# Patient Record
Sex: Male | Born: 1935 | Race: White | Hispanic: No | Marital: Married | Smoking: Former smoker
Health system: Southern US, Community
[De-identification: ages and names within clinical notes are randomized; demographics above are authoritative.]

## PROBLEM LIST (undated history)

## (undated) DIAGNOSIS — I7121 Aneurysm of the ascending aorta, without rupture: Secondary | ICD-10-CM

## (undated) DIAGNOSIS — N183 Chronic kidney disease, stage 3 unspecified: Secondary | ICD-10-CM

## (undated) DIAGNOSIS — I5189 Other ill-defined heart diseases: Secondary | ICD-10-CM

## (undated) DIAGNOSIS — E785 Hyperlipidemia, unspecified: Secondary | ICD-10-CM

## (undated) DIAGNOSIS — I480 Paroxysmal atrial fibrillation: Secondary | ICD-10-CM

## (undated) DIAGNOSIS — I10 Essential (primary) hypertension: Secondary | ICD-10-CM

## (undated) DIAGNOSIS — K219 Gastro-esophageal reflux disease without esophagitis: Secondary | ICD-10-CM

## (undated) DIAGNOSIS — R972 Elevated prostate specific antigen [PSA]: Secondary | ICD-10-CM

## (undated) DIAGNOSIS — Z87442 Personal history of urinary calculi: Secondary | ICD-10-CM

## (undated) DIAGNOSIS — C9000 Multiple myeloma not having achieved remission: Secondary | ICD-10-CM

## (undated) DIAGNOSIS — Z86010 Personal history of colonic polyps: Secondary | ICD-10-CM

## (undated) DIAGNOSIS — N400 Enlarged prostate without lower urinary tract symptoms: Secondary | ICD-10-CM

## (undated) DIAGNOSIS — R52 Pain, unspecified: Secondary | ICD-10-CM

## (undated) DIAGNOSIS — N289 Disorder of kidney and ureter, unspecified: Secondary | ICD-10-CM

## (undated) DIAGNOSIS — S22000A Wedge compression fracture of unspecified thoracic vertebra, initial encounter for closed fracture: Secondary | ICD-10-CM

## (undated) HISTORY — DX: Pain, unspecified: R52

## (undated) HISTORY — PX: TONSILLECTOMY: SUR1361

## (undated) HISTORY — DX: Aneurysm of the ascending aorta, without rupture: I71.21

## (undated) HISTORY — DX: Multiple myeloma not having achieved remission: C90.00

## (undated) HISTORY — DX: Other ill-defined heart diseases: I51.89

## (undated) HISTORY — DX: Benign prostatic hyperplasia without lower urinary tract symptoms: N40.0

## (undated) HISTORY — DX: Chronic kidney disease, stage 3 unspecified: N18.30

## (undated) HISTORY — DX: Hyperlipidemia, unspecified: E78.5

## (undated) HISTORY — DX: Elevated prostate specific antigen (PSA): R97.20

## (undated) HISTORY — DX: Paroxysmal atrial fibrillation: I48.0

## (undated) HISTORY — DX: Essential (primary) hypertension: I10

## (undated) HISTORY — DX: Wedge compression fracture of unspecified thoracic vertebra, initial encounter for closed fracture: S22.000A

## (undated) HISTORY — PX: KIDNEY STONE SURGERY: SHX686

## (undated) HISTORY — DX: Personal history of colonic polyps: Z86.010

## (undated) HISTORY — PX: COLON SURGERY: SHX602

---
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
## 2006-09-13 ENCOUNTER — Encounter: Payer: Self-pay | Admitting: Family Medicine

## 2006-09-13 DIAGNOSIS — Z860100 Personal history of colon polyps, unspecified: Secondary | ICD-10-CM

## 2006-09-13 HISTORY — DX: Personal history of colonic polyps: Z86.010

## 2006-09-13 HISTORY — DX: Personal history of colon polyps, unspecified: Z86.0100

## 2006-10-18 ENCOUNTER — Ambulatory Visit: Payer: Self-pay | Admitting: Family Medicine

## 2006-10-25 ENCOUNTER — Ambulatory Visit: Payer: Self-pay | Admitting: Family Medicine

## 2006-10-25 LAB — CONVERTED CEMR LAB
ALT: 15 units/L (ref 0–40)
Alkaline Phosphatase: 78 units/L (ref 39–117)
BUN: 16 mg/dL (ref 6–23)
CO2: 31 meq/L (ref 19–32)
Calcium: 9.1 mg/dL (ref 8.4–10.5)
GFR calc Af Amer: 77 mL/min
GFR calc non Af Amer: 63 mL/min
Total CHOL/HDL Ratio: 5.1
Triglycerides: 205 mg/dL (ref 0–149)

## 2006-12-21 ENCOUNTER — Encounter: Payer: Self-pay | Admitting: Family Medicine

## 2006-12-21 DIAGNOSIS — I10 Essential (primary) hypertension: Secondary | ICD-10-CM | POA: Insufficient documentation

## 2006-12-21 DIAGNOSIS — K219 Gastro-esophageal reflux disease without esophagitis: Secondary | ICD-10-CM | POA: Insufficient documentation

## 2006-12-21 DIAGNOSIS — E781 Pure hyperglyceridemia: Secondary | ICD-10-CM | POA: Insufficient documentation

## 2006-12-21 DIAGNOSIS — N200 Calculus of kidney: Secondary | ICD-10-CM | POA: Insufficient documentation

## 2007-01-16 ENCOUNTER — Ambulatory Visit: Payer: Self-pay | Admitting: Family Medicine

## 2007-07-21 ENCOUNTER — Ambulatory Visit: Payer: Self-pay

## 2007-09-14 HISTORY — PX: RESECTION SOFT TISSUE TUMOR LEG / ANKLE RADICAL: SUR1250

## 2007-10-06 ENCOUNTER — Ambulatory Visit: Payer: Self-pay | Admitting: Family Medicine

## 2007-11-12 LAB — CONVERTED CEMR LAB
Cholesterol: 210 mg/dL
Glucose, Bld: 98 mg/dL
HDL: 41 mg/dL
LDL Cholesterol: 123 mg/dL
Triglycerides: 230 mg/dL

## 2008-10-16 ENCOUNTER — Ambulatory Visit: Payer: Self-pay | Admitting: Family Medicine

## 2008-12-30 ENCOUNTER — Encounter: Payer: Self-pay | Admitting: Family Medicine

## 2009-01-23 ENCOUNTER — Ambulatory Visit: Payer: Self-pay | Admitting: Unknown Physician Specialty

## 2009-01-23 ENCOUNTER — Encounter: Payer: Self-pay | Admitting: Family Medicine

## 2009-02-17 ENCOUNTER — Ambulatory Visit: Payer: Self-pay | Admitting: Family Medicine

## 2009-02-26 ENCOUNTER — Encounter: Payer: Self-pay | Admitting: Family Medicine

## 2009-02-28 LAB — CONVERTED CEMR LAB
AST: 27 units/L (ref 0–37)
Albumin: 4 g/dL (ref 3.5–5.2)
Alkaline Phosphatase: 59 units/L (ref 39–117)
CO2: 30 meq/L (ref 19–32)
Glucose, Bld: 100 mg/dL — ABNORMAL HIGH (ref 70–99)
Potassium: 4.1 meq/L (ref 3.5–5.1)
Sodium: 142 meq/L (ref 135–145)
Total Protein: 6.4 g/dL (ref 6.0–8.3)

## 2009-07-11 ENCOUNTER — Encounter: Payer: Self-pay | Admitting: Family Medicine

## 2011-01-29 NOTE — Assessment & Plan Note (Signed)
[facility]                           [place] OFFICE NOTE   DUNTE, BLINN                      MRN:          [id number]  DATE:10/18/2006                            DOB:          Jan 19, 1933    CHIEF COMPLAINT:  A 77 year old white male here to establish new doctor.   HISTORY OF PRESENT ILLNESS:  Mr. Hartner states he is a very healthy male  who has not seen a doctor regularly.  He states that he previously had a  physical about 2 years ago at [facility], but was unhappy with  their care.  He comes to clinic today with the following issues:  1. Hypertension, poor control:  He states that he has been told off      and on that he has high blood pressure when he has it measured at      the [facility] and he has also noticed that it is above 140/90      intermittently at pharmacies.  He has some measurements that he      brought along with him showing blood pressures around 167/83,      160/90, but also 134/85.  He has never been on blood pressure      medication in the past but agrees that he likely has high blood      pressure and is open to medication.  He does try and avoid high      salt diet.  2. Cough, acute:  He has been having 3 days of dry that is keeping him      up at night.  He feels like he has had fever off and on.  He has      congestion, post nasal drip, no face pain, no ear pain, no wheeze,      no shortness of breath.  He has been using cough drops, Mucinex,      but is not sure if he is using a decongestant.  He does have a      history of smoking with a 10 pack year history remotely.   REVIEW OF SYSTEMS:  Occasional headache recently with cold, no  dizziness, no syncope, wears glasses, no hearing problems, no chest  pain, no shortness of breath, no nausea, vomiting, diarrhea,  constipation or rectal bleeding.   PAST MEDICAL HISTORY:  1. Hypertension.  2. GERD.  3. Kidney stones.   HOSPITALIZATION, SURGERIES AND  PROCEDURES:  1. 1942 neck infection possibly impetigo.  2. 1946 punctured stomach after was impaled on fence.  3. 1962 sinus surgery.  4. 1940 tonsillectomy.   ALLERGIES:  None.   MEDICATIONS:  1. Vitamin C 2 tablets daily.  2. Beta-carotene 2 tablets daily.  3. Saw palmetto 1 tablet daily.  4. Zinc 1 tablet daily.  5. Magnesium 2 tablets daily.  6. CoQ 10, 1 tablet daily.  7. Vitamin E 1 tablet daily.  8. Vitamin B complex 1 tablet daily.   SOCIAL HISTORY:  Former smoker with 10 pack year history remotely,  drinks 1-2 alcoholic beverages per week.  No history  of drug use.  He is  a retired Forensic psychologist.  He has been married for 5 years.  He has 4  children of his own, 2 who are biological who are healthy, 2 adopted.  He exercises 5 times per week at the gym doing cardio and weight  lifting.  He eats 3 meals a day, fruits and vegetables somewhat and no  caffeine and rare sodas.   FAMILY HISTORY:  Father deceased at age 27 with MI.  Mother alive at age  73, healthy.  No MI before age 25.  He has 1 brother and 2 sisters who  are healthy, except 1 sister did have throat cancer.  Paternal  grandfather and paternal uncles did have diabetes.  There is no family  history of any type of cancer as far as he is aware.   PHYSICAL EXAMINATION:  Height 67-3/4 inches, weight 199 making BMI 30,  blood pressure 154/100, pulse 80, temperature 98.8.  GENERAL:  A overweight appearing male in no apparent distress, appears  younger than stated age.  HEENT:  PERRLA, extraocular muscles intact.  Oropharynx with erythema.  Tympanic membranes with clear fluid, nares patent. No cervical or  supraclavicular lymphadenopathy, no thyromegaly, mild tenderness  palpation of her bilateral sinuses and axillary sinuses.  CARDIOVASCULAR:  Regular rate and rhythm.  No murmurs, rubs or gallops.  PULMONARY:  Clear to auscultation bilaterally.  No wheezes, rales or  rhonchi.  ABDOMEN:  Soft, nontender, no  abnormal bowel sounds, no  hepatosplenomegaly.  MUSCULOSKELETAL:  Strength 5/5 in upper and lower extremities.  NEURO:  Alert and orientated x 3.  Cranial nerves 2 through 12 grossly  intact.  Patella reflexes 2+.  SKIN:  No lesions.   ASSESSMENT/PLAN:  1. Hypertension, poor control:  We did discuss a low salt diet and      continuing exercise.  We will begin him on HCTZ 25 mg daily.  He      will follow up in 3 months, but we will also follow his blood      pressures at home and he will let me know if it is not coming down      less than 140/90.  2. Acute bronchitis:  Given his age and his reported severity of cough      as well as his history of smoking, I will begin him on a Z-Pak as      directed for the next 5 days.  He will also use codeine with      guaifenesin for a cough suppressant at night.  He will let me know      if he does not improve and will go to the emergency room if he has      shortness of breath.  3. Prevention:  He is due for a cholesterol and diabetes screen.  He      is up to date with prostate screen.  We did briefly discuss      colonoscopy verus Hemoccult and he will consider these for      colon cancer screening.  He is due for tetanus, but would like me      to review his old records      before I administer this.  He is also unsure of whether he has had      Pneumovax in the past.  I will get records from his previous      physician.     Eliezer Lofts, MD  Electronically Signed    AB/MedQ  DD: 10/19/2006  DT: 10/19/2006  Job #: [id number]

## 2011-06-10 ENCOUNTER — Other Ambulatory Visit: Payer: Self-pay | Admitting: Family Medicine

## 2011-07-06 ENCOUNTER — Ambulatory Visit (INDEPENDENT_AMBULATORY_CARE_PROVIDER_SITE_OTHER): Payer: Medicare Other | Admitting: Internal Medicine

## 2011-07-06 ENCOUNTER — Encounter: Payer: Self-pay | Admitting: Internal Medicine

## 2011-07-06 VITALS — BP 140/84 | HR 82 | Temp 98.2°F | Resp 16 | Ht 68.5 in | Wt 194.0 lb

## 2011-07-06 DIAGNOSIS — I10 Essential (primary) hypertension: Secondary | ICD-10-CM | POA: Insufficient documentation

## 2011-07-06 DIAGNOSIS — N400 Enlarged prostate without lower urinary tract symptoms: Secondary | ICD-10-CM | POA: Insufficient documentation

## 2011-07-06 DIAGNOSIS — R972 Elevated prostate specific antigen [PSA]: Secondary | ICD-10-CM

## 2011-07-06 DIAGNOSIS — E781 Pure hyperglyceridemia: Secondary | ICD-10-CM

## 2011-07-06 MED ORDER — HYDROCHLOROTHIAZIDE 25 MG PO TABS: 25.0000 mg | ORAL_TABLET | Freq: Every day | ORAL | Status: DC

## 2011-07-06 NOTE — Patient Instructions (Signed)
Repeat bp in one week; goal  130/80 or less.    Send copies of recent labs and screenings.   Check on TdaP vaccine (tetanus ,  Diphtheria, pertussis).  [facility]abel [facility] or herefor $ 90 (Medicare does not pay for it)

## 2011-07-07 ENCOUNTER — Encounter: Payer: Self-pay | Admitting: Internal Medicine

## 2011-07-07 NOTE — Assessment & Plan Note (Signed)
Elevated today .  He has been asked to check it a few times over the next several weeks and call results to office.

## 2011-07-07 NOTE — Assessment & Plan Note (Signed)
He has no intention of getting a prostate biopsy and is aware of the controversies surrounding biopsies and treatments

## 2011-07-07 NOTE — Progress Notes (Signed)
  Subjective:    Patient ID: Jack [name], male    DOB: Mar 27, 1933, 77 y.o.   MRN: [id number]  HPI  Jack [name] is a 77 yo male who is referred by a neighbor for  establishment of primary care.  He has no acute issues today.    Past Medical History  Diagnosis Date  . History of colon polyps 2008    [facility],   . Prostate hypertrophy     diagnosed at age 56 due to hematospermia  . Elevated prostate specific antigen (PSA)     has been 7 for a year   . Hypertension    No current outpatient prescriptions on file prior to visit.     Review of Systems  Constitutional: Negative for fever, chills, diaphoresis, activity change, appetite change, fatigue and unexpected weight change.  HENT: Negative for hearing loss, ear pain, nosebleeds, congestion, sore throat, facial swelling, rhinorrhea, sneezing, drooling, mouth sores, trouble swallowing, neck pain, neck stiffness, dental problem, voice change, postnasal drip, sinus pressure, tinnitus and ear discharge.   Eyes: Negative for photophobia, pain, discharge, redness, itching and visual disturbance.  Respiratory: Negative for apnea, cough, choking, chest tightness, shortness of breath, wheezing and stridor.   Cardiovascular: Negative for chest pain, palpitations and leg swelling.  Gastrointestinal: Negative for nausea, vomiting, abdominal pain, diarrhea, constipation, blood in stool, abdominal distention, anal bleeding and rectal pain.  Genitourinary: Negative for dysuria, urgency, frequency, hematuria, flank pain, decreased urine volume, scrotal swelling, difficulty urinating and testicular pain.  Musculoskeletal: Negative for myalgias, back pain, joint swelling, arthralgias and gait problem.  Skin: Negative for color change, rash and wound.  Neurological: Negative for dizziness, tremors, seizures, syncope, speech difficulty, weakness, light-headedness, numbness and headaches.  Psychiatric/Behavioral: Negative for suicidal ideas,  hallucinations, behavioral problems, confusion, sleep disturbance, dysphoric mood, decreased concentration and agitation. The patient is not nervous/anxious.        Objective:   Physical Exam        Assessment & Plan:

## 2011-07-07 NOTE — Assessment & Plan Note (Addendum)
LDL 192 by current labs.  Will recommend low carbohydrate diet and exercise.

## 2012-06-14 ENCOUNTER — Other Ambulatory Visit: Payer: Self-pay | Admitting: Internal Medicine

## 2012-06-14 NOTE — Telephone Encounter (Signed)
30 days only.  NEEDS LABS AND OV PRIOR TO ANY MORE REFILLS

## 2012-07-05 ENCOUNTER — Ambulatory Visit (INDEPENDENT_AMBULATORY_CARE_PROVIDER_SITE_OTHER): Payer: Medicare Other | Admitting: Internal Medicine

## 2012-07-05 ENCOUNTER — Encounter: Payer: Self-pay | Admitting: Internal Medicine

## 2012-07-05 VITALS — BP 138/86 | HR 80 | Temp 97.8°F | Ht 68.5 in | Wt 195.0 lb

## 2012-07-05 DIAGNOSIS — I10 Essential (primary) hypertension: Secondary | ICD-10-CM

## 2012-07-05 DIAGNOSIS — E781 Pure hyperglyceridemia: Secondary | ICD-10-CM

## 2012-07-05 DIAGNOSIS — N400 Enlarged prostate without lower urinary tract symptoms: Secondary | ICD-10-CM

## 2012-07-05 NOTE — Patient Instructions (Addendum)
Please return for your annual medicare wellness exam.  This requires  30 minute visit and blood work which will be done on the same day

## 2012-07-05 NOTE — Progress Notes (Signed)
Patient ID: Jack [name], male   DOB: 03/28/1933, 78 y.o.   MRN: [id number]    Patient Active Problem List  Diagnosis  . HYPERTRIGLYCERIDEMIA  . HYPERTENSION  . GERD  . CALCULUS, KIDNEY  . Prostate hypertrophy  . Elevated prostate specific antigen (PSA)    Subjective:  CC:   Chief Complaint  Patient presents with  . Follow-up    HPI:   Jack [name] a 78 y.o. male who presents Hypertension.   He was last seen one year ago as a new patient and did not return for labs or for 6 month follow up as directed.  He feels generally well and has not had any hospitalizations or labs in over one year.  He is not currently fasting.  His home Bps have been 120 to 140  / 80 to 86.   He has Occasional joint pain.but exercises daily at a local gym doing cardio and weights.  His joint pain is aggravated by climbing stairs.   He has no history of falls.  Has been taking krill oil.    Past Medical History  Diagnosis Date  . History of colon polyps 2008    [facility],   . Prostate hypertrophy     diagnosed at age 42 due to hematospermia  . Elevated prostate specific antigen (PSA)     has been 7 for a year   . Hypertension     Past Surgical History  Procedure Date  . Resection soft tissue tumor leg / ankle radical jan 2009    [facility],  right thigh/knee , nonmalignant  . Small intestine surgery 1946    implaed on picket fence, punctured stomach         The following portions of the patient's history were reviewed and updated as appropriate: Allergies, current medications, and problem list.    Review of Systems:   12 Pt  review of systems was negative except those addressed in the HPI,     History   Social History  . Marital Status: Married    Spouse Name: [name]    Number of Children: 4  . Years of Education: 16   Occupational History  . Retired     Personal assistant    Social History Main Topics  . Smoking status: Former Smoker    Quit date: 07/05/1965  .  Smokeless tobacco: Never Used  . Alcohol Use: 0.5 oz/week    1 drink(s) per week     occassionaly  . Drug Use: No  . Sexually Active: Not on file   Other Topics Concern  . Not on file   Social History Narrative  . No narrative on file    Objective:  BP 138/86  Pulse 80  Temp 97.8 F (36.6 C)  Ht 5' 8.5" (1.74 m)  Wt 195 lb (88.451 kg)  BMI 29.22 kg/m2  SpO2 95%  General appearance: alert, cooperative and appears stated age Neck: no adenopathy, no carotid bruit, supple, symmetrical, trachea midline and thyroid not enlarged, symmetric, no tenderness/mass/nodules Back: symmetric, no curvature. ROM normal. No CVA tenderness. Lungs: clear to auscultation bilaterally Heart: regular rate and rhythm, S1, S2 normal, no murmur, click, rub or gallop Abdomen: soft, non-tender; bowel sounds normal; no masses,  no organomegaly Pulses: 2+ and symmetric Skin: Skin color, texture, turgor normal. No rashes or lesions Lymph nodes: Cervical, supraclavicular, and axillary nodes normal.  Assessment and Plan:  HYPERTENSION Currently above goal. He will return for fasting blood work  and renal function testing prior to adjustment of medications.  HYPERTRIGLYCERIDEMIA He has been treating his hypertriglyceridemia with Krill oil. He will return for fasting labs.  Prostate hypertrophy He has no overt symptoms of BPH currently. He will return for his annual exam and a PSA.   Updated Medication List Outpatient Encounter Prescriptions as of 07/05/2012  Medication Sig Dispense Refill  . hydrochlorothiazide (HYDRODIURIL) 25 MG tablet TAKE 1 TABLET BY MOUTH DAILY  30 tablet  0     Orders Placed This Encounter  Procedures  . Tdap vaccine greater than or equal to 7yo IM    No Follow-up on file.

## 2012-07-07 ENCOUNTER — Encounter: Payer: Self-pay | Admitting: Internal Medicine

## 2012-07-07 NOTE — Assessment & Plan Note (Signed)
Currently above goal. He will return for fasting blood work and renal function testing prior to adjustment of medications.

## 2012-07-07 NOTE — Assessment & Plan Note (Signed)
He has no overt symptoms of BPH currently. He will return for his annual exam and a PSA.

## 2012-07-07 NOTE — Assessment & Plan Note (Signed)
He has been treating his hypertriglyceridemia with Krill oil. He will return for fasting labs.

## 2012-07-14 ENCOUNTER — Other Ambulatory Visit: Payer: Self-pay

## 2012-07-14 ENCOUNTER — Other Ambulatory Visit (INDEPENDENT_AMBULATORY_CARE_PROVIDER_SITE_OTHER): Payer: Medicare Other

## 2012-07-14 DIAGNOSIS — N400 Enlarged prostate without lower urinary tract symptoms: Secondary | ICD-10-CM

## 2012-07-14 DIAGNOSIS — I10 Essential (primary) hypertension: Secondary | ICD-10-CM

## 2012-07-14 DIAGNOSIS — E781 Pure hyperglyceridemia: Secondary | ICD-10-CM

## 2012-07-14 LAB — COMPREHENSIVE METABOLIC PANEL
ALT: 14 U/L (ref 0–53)
AST: 23 U/L (ref 0–37)
Alkaline Phosphatase: 61 U/L (ref 39–117)
Chloride: 102 mEq/L (ref 96–112)
Creatinine, Ser: 1.2 mg/dL (ref 0.4–1.5)
Total Bilirubin: 1.2 mg/dL (ref 0.3–1.2)

## 2012-07-14 LAB — CBC WITH DIFFERENTIAL/PLATELET
Basophils Absolute: 0 10*3/uL (ref 0.0–0.1)
Eosinophils Absolute: 0.1 10*3/uL (ref 0.0–0.7)
Hemoglobin: 16.6 g/dL (ref 13.0–17.0)
Lymphocytes Relative: 35.8 % (ref 12.0–46.0)
Lymphs Abs: 2.3 10*3/uL (ref 0.7–4.0)
MCHC: 33 g/dL (ref 30.0–36.0)
MCV: 92.1 fl (ref 78.0–100.0)
Monocytes Absolute: 0.4 10*3/uL (ref 0.1–1.0)
Neutro Abs: 3.6 10*3/uL (ref 1.4–7.7)
RDW: 13.2 % (ref 11.5–14.6)

## 2012-07-14 LAB — TSH: TSH: 1.06 u[IU]/mL (ref 0.35–5.50)

## 2012-07-14 LAB — LIPID PANEL: VLDL: 53 mg/dL — ABNORMAL HIGH (ref 0.0–40.0)

## 2012-07-14 LAB — LDL CHOLESTEROL, DIRECT: Direct LDL: 133.8 mg/dL

## 2012-07-27 ENCOUNTER — Other Ambulatory Visit: Payer: Self-pay | Admitting: Internal Medicine

## 2012-07-27 ENCOUNTER — Telehealth: Payer: Self-pay | Admitting: Internal Medicine

## 2012-07-27 DIAGNOSIS — I10 Essential (primary) hypertension: Secondary | ICD-10-CM

## 2012-07-27 DIAGNOSIS — Z79899 Other long term (current) drug therapy: Secondary | ICD-10-CM

## 2012-07-27 MED ORDER — LISINOPRIL-HYDROCHLOROTHIAZIDE 20-12.5 MG PO TABS: 1.0000 | ORAL_TABLET | Freq: Every day | ORAL | Status: DC

## 2012-07-27 NOTE — Telephone Encounter (Signed)
Patient notified about medication change  

## 2012-07-27 NOTE — Telephone Encounter (Signed)
I received his letter.  For his bp I am recommending that we change his medications from hctz  to lisinopril/hctz to bring his diastolic under control. Once he starts this he will need to return for a BMET about a week later to check potassium and kidney function.  i will send the new rx to pharmacy

## 2012-08-14 ENCOUNTER — Other Ambulatory Visit (INDEPENDENT_AMBULATORY_CARE_PROVIDER_SITE_OTHER): Payer: Medicare Other

## 2012-08-14 ENCOUNTER — Telehealth: Payer: Self-pay | Admitting: *Deleted

## 2012-08-14 DIAGNOSIS — Z79899 Other long term (current) drug therapy: Secondary | ICD-10-CM

## 2012-08-14 LAB — BASIC METABOLIC PANEL
Calcium: 9.9 mg/dL (ref 8.4–10.5)
Chloride: 101 mEq/L (ref 96–112)
Creatinine, Ser: 1.3 mg/dL (ref 0.4–1.5)

## 2012-08-14 NOTE — Telephone Encounter (Signed)
What labs orders and diagnostic code would you like for this patient?

## 2012-08-14 NOTE — Telephone Encounter (Signed)
BMEt,  V58.69.  Thank you

## 2012-08-21 ENCOUNTER — Telehealth: Payer: Self-pay | Admitting: Internal Medicine

## 2012-08-21 DIAGNOSIS — E785 Hyperlipidemia, unspecified: Secondary | ICD-10-CM

## 2012-08-21 DIAGNOSIS — E783 Hyperchylomicronemia: Secondary | ICD-10-CM

## 2012-08-21 DIAGNOSIS — Z79899 Other long term (current) drug therapy: Secondary | ICD-10-CM

## 2012-08-21 NOTE — Telephone Encounter (Signed)
Thank him for his letter. His bp is definitely better.  Continue the lisinopril/hctz combination.  No worries about the elevated blood sugar; since he was not fasting, the number is NOT diagnostic or suggestive of diabetes. Remind him to have repeat fasting lipids and follow up visit in May

## 2012-08-24 NOTE — Telephone Encounter (Signed)
Spoke to patient via phone gave him instructions as directed.

## 2012-08-29 ENCOUNTER — Telehealth: Payer: Self-pay | Admitting: Internal Medicine

## 2012-08-29 NOTE — Telephone Encounter (Addendum)
Lisinopril HCTZ 20 -12.5 mg tab Take 1 tablet by mouth daily  #90

## 2012-08-30 ENCOUNTER — Telehealth: Payer: Self-pay | Admitting: Internal Medicine

## 2012-08-31 ENCOUNTER — Other Ambulatory Visit: Payer: Self-pay

## 2012-08-31 NOTE — Telephone Encounter (Signed)
Rx was filled 07/27/12 with 3 R

## 2012-09-07 ENCOUNTER — Telehealth: Payer: Self-pay | Admitting: Internal Medicine

## 2012-09-07 DIAGNOSIS — I10 Essential (primary) hypertension: Secondary | ICD-10-CM

## 2012-09-07 MED ORDER — LISINOPRIL-HYDROCHLOROTHIAZIDE 20-12.5 MG PO TABS: 1.0000 | ORAL_TABLET | Freq: Every day | ORAL | Status: DC

## 2012-09-07 NOTE — Telephone Encounter (Signed)
meds filled

## 2012-09-07 NOTE — Telephone Encounter (Signed)
Refill request for 90 day supply on lisinopril hctz 20-12.5 mg tab Sig: take one tablet by mouth daily

## 2012-09-12 ENCOUNTER — Other Ambulatory Visit: Payer: Self-pay | Admitting: Internal Medicine

## 2012-09-12 DIAGNOSIS — I10 Essential (primary) hypertension: Secondary | ICD-10-CM

## 2012-09-12 NOTE — Telephone Encounter (Signed)
Refill that was sent in for Linisinopril-HCTZ 20-12.5 mg tablets needed to be 90 day supply instead of 30

## 2012-09-19 MED ORDER — LISINOPRIL-HYDROCHLOROTHIAZIDE 20-12.5 MG PO TABS: 1.0000 | ORAL_TABLET | Freq: Every day | ORAL | Status: DC

## 2012-09-19 NOTE — Telephone Encounter (Signed)
Med refilled with qty #90

## 2012-10-02 ENCOUNTER — Telehealth: Payer: Self-pay | Admitting: General Practice

## 2012-10-02 ENCOUNTER — Encounter: Payer: Self-pay | Admitting: Adult Health

## 2012-10-02 ENCOUNTER — Ambulatory Visit (INDEPENDENT_AMBULATORY_CARE_PROVIDER_SITE_OTHER): Payer: Medicare Other | Admitting: Adult Health

## 2012-10-02 VITALS — BP 130/88 | HR 81 | Temp 98.6°F | Ht 68.0 in | Wt 201.0 lb

## 2012-10-02 DIAGNOSIS — R05 Cough: Secondary | ICD-10-CM

## 2012-10-02 MED ORDER — HYDROCODONE-HOMATROPINE 5-1.5 MG/5ML PO SYRP: 5.0000 mL | ORAL_SOLUTION | Freq: Three times a day (TID) | ORAL | Status: DC | PRN

## 2012-10-02 MED ORDER — AZITHROMYCIN 250 MG PO TABS: ORAL_TABLET | ORAL | Status: DC

## 2012-10-02 NOTE — Patient Instructions (Addendum)
  Start your antibiotic today.  Drink plenty of fluids to stay hydrated.  Take the hycodan syrup for cough. This may make you drowsy.  Call if your symptoms are not improved within 3-4 days.

## 2012-10-02 NOTE — Assessment & Plan Note (Signed)
Episodes of paroxysmal coughing and fatigue. No other symptom reported. Treat prophylactically for pertussis. Start azithromycin. Hycodan for cough symptom. If no relief, consider other sources. Patient on ACE-inhibitor. Would consider switching to ARB if cough does not resolve.

## 2012-10-02 NOTE — Telephone Encounter (Signed)
Pt called stating that he has been on the BP medication for two months and does not feel that it is the cause of his cough.

## 2012-10-02 NOTE — Progress Notes (Signed)
  Subjective:    Patient ID: Jack [name], male    DOB: [birth year], 79 y.o.   MRN: [id number]  HPI  Patient Is a pleasant 79 y/o male with a hx of HTN, HLD, GERD who presents to clinic with a month hx of paroxysms of coughing. He reports being very tired. Denies fever, chills, shortness of breath, posttussive emesis or whooping. He has tried Mucinex DM (1 whole package), Coricedin without providing any relief.  Current Outpatient Prescriptions on File Prior to Visit  Medication Sig Dispense Refill  . lisinopril-hydrochlorothiazide (ZESTORETIC) 20-12.5 MG per tablet Take 1 tablet by mouth daily.  90 tablet  3     Review of Systems  Constitutional: Positive for fatigue.  HENT: Positive for postnasal drip. Negative for sore throat, rhinorrhea and sinus pressure.   Eyes: Negative.   Respiratory: Positive for cough. Negative for chest tightness and wheezing.   Cardiovascular: Negative.   Gastrointestinal: Negative.   Genitourinary: Negative.   Skin: Negative.   Neurological: Negative.   Psychiatric/Behavioral: Negative.    BP 130/88  Pulse 81  Temp 98.6 F (37 C) (Oral)  Ht 5\' 8"  (1.727 m)  Wt 201 lb (91.173 kg)  BMI 30.56 kg/m2  SpO2 94%      Objective:   Physical Exam  Constitutional: He is oriented to person, place, and time. He appears well-developed and well-nourished. No distress.  HENT:  Head: Normocephalic and atraumatic.  Right Ear: External ear normal.  Left Ear: External ear normal.  Nose: Nose normal.  Mouth/Throat: No oropharyngeal exudate.       Slight pharyngeal erythema noted.  Eyes: Conjunctivae normal are normal. Pupils are equal, round, and reactive to light.  Cardiovascular: Normal rate, regular rhythm and normal heart sounds.  Exam reveals no gallop.   No murmur heard. Pulmonary/Chest: Effort normal and breath sounds normal. No respiratory distress. He has no wheezes. He has no rales. He exhibits no tenderness.  Abdominal: Soft. Bowel sounds are  normal.  Musculoskeletal: Normal range of motion.  Neurological: He is alert and oriented to person, place, and time. He has normal reflexes.  Skin: Skin is warm and dry.  Psychiatric: He has a normal mood and affect. His behavior is normal. Judgment and thought content normal.          Assessment & Plan:

## 2013-01-05 ENCOUNTER — Ambulatory Visit (INDEPENDENT_AMBULATORY_CARE_PROVIDER_SITE_OTHER): Payer: Medicare Other | Admitting: Internal Medicine

## 2013-01-05 ENCOUNTER — Encounter: Payer: Self-pay | Admitting: Internal Medicine

## 2013-01-05 VITALS — BP 138/82 | HR 76 | Temp 97.8°F | Resp 16 | Wt 199.2 lb

## 2013-01-05 DIAGNOSIS — I10 Essential (primary) hypertension: Secondary | ICD-10-CM

## 2013-01-05 DIAGNOSIS — R05 Cough: Secondary | ICD-10-CM

## 2013-01-05 DIAGNOSIS — R972 Elevated prostate specific antigen [PSA]: Secondary | ICD-10-CM

## 2013-01-05 DIAGNOSIS — E781 Pure hyperglyceridemia: Secondary | ICD-10-CM

## 2013-01-05 MED ORDER — AMLODIPINE BESYLATE 5 MG PO TABS: 5.0000 mg | ORAL_TABLET | Freq: Every day | ORAL | Status: DC

## 2013-01-05 NOTE — Patient Instructions (Addendum)
Try flushing sinuses after being outside with Simply Saline  Try allegra, zyrtec and claritin for allergies  They are all  Daily dosed   Take home stool test to check for blood in lieu of deferred colonoscopy   We are switching your blood pressure medication to amlodipine for 60 days st see if the cough resolves  We will decide at the end of 60 days whether you want to resume lisinopril or stay with the amlodipine  Chest x ray today because of a 4 month history of cougfh (to rule out tumor, COPD, etc)    I recommend taking a baby aspirin daily

## 2013-01-05 NOTE — Progress Notes (Signed)
Patient ID: Jack [name], male   DOB: 12-21-1932, 79 y.o.   MRN: [id number]  Patient Active Problem List  Diagnosis  . HYPERTRIGLYCERIDEMIA  . HYPERTENSION  . GERD  . CALCULUS, KIDNEY  . Prostate hypertrophy  . Elevated prostate specific antigen (PSA)  . Cough    Subjective:  CC:   Chief Complaint  Patient presents with  . Follow-up    HPI:   SOSTENES [name] a 79 y.o. male who presents  For follow up on hypertension, hyperlipidemia and cough .  He was treated for sinusitis/bronchitis  by Raquel in January with azithromycin and hydrocodone but developed nausea and vomiting so did not take either medication for more than 48 hours.  All symptoms resolved except a dry cough which occurs daily in the absence of other symptoms. Jack Kitchen  He tried to contact us by setting up his MyChartaccount but had too much difficulty setting it up.  His symptoms eventually resolved with supportive care.   He feels well, has no current complaints.  He denies dyspnea and chest pain and is exercising regularly.  He is due for follow up colonoscopy by [name] due to history of polyp but has no interest in repeating the colonoscopy at this time.  His bowels move regularly and he has not seen any blood in his stools.  He has a history of BPH but has no symptoms of frequency or hesitance and has deferred urology evaluation for his elevated PSA.    Past Medical History  Diagnosis Date  . History of colon polyps 2008    [facility],   . Prostate hypertrophy     diagnosed at age 35 due to hematospermia  . Elevated prostate specific antigen (PSA)     has been 7 for a year   . Hypertension     Past Surgical History  Procedure Laterality Date  . 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 Small intestine surgery  1946    implaed on picket fence, punctured stomach     The following portions of the patient's history were reviewed and  updated as appropriate: Allergies, current medications, and problem list.    Review of Systems:   Patient denies headache, fevers, malaise, unintentional weight loss, skin rash, eye pain, sinus congestion and sinus pain, sore throat, dysphagia,  Hemoptysis, dyspnea, wheezing, chest pain, palpitations, orthopnea, edema, abdominal pain, nausea, melena, diarrhea, constipation, flank pain, dysuria, hematuria, urinary  Frequency, nocturia, numbness, tingling, seizures,  Focal weakness, Loss of consciousness,  Tremor, insomnia, depression, anxiety, and suicidal ideation.     History   Social History  . Marital Status: Married    Spouse Name: Jack [name]    Number of Children: 4  . Years of Education: 16   Occupational History  . Retired     Personal assistant    Social History Main Topics  . Smoking status: Former Smoker    Quit date: 07/05/1965  . Smokeless tobacco: Never Used  . Alcohol Use: 0.5 oz/week    1 drink(s) per week     Comment: occassionaly  . Drug Use: No  . Sexually Active: Not on file   Other Topics Concern  . Not on file   Social History Narrative  . No narrative on file    Objective:  BP 138/82  Pulse 76  Temp(Src) 97.8 F (36.6 C) (Oral)  Resp 16  Wt 199 lb  4 oz (90.379 kg)  BMI 30.3 kg/m2  SpO2 96%  General appearance: alert, cooperative and appears stated age Ears: normal TM's and external ear canals both ears Throat: lips, mucosa, and tongue normal; teeth and gums normal Neck: no adenopathy, no carotid bruit, supple, symmetrical, trachea midline and thyroid not enlarged, symmetric, no tenderness/mass/nodules Back: symmetric, no curvature. ROM normal. No CVA tenderness. Lungs: clear to auscultation bilaterally Heart: regular rate and rhythm, S1, S2 normal, no murmur, click, rub or gallop Abdomen: soft, non-tender; bowel sounds normal; no masses,  no organomegaly Pulses: 2+ and symmetric Skin: Skin color, texture, turgor normal. No rashes or  lesions Lymph nodes: Cervical, supraclavicular, and axillary nodes normal.  Assessment and Plan:  Cough Persistent daily, nonproductive.  May be due to lisinopril, allergies or endobronchial tumor or COPD given prior history of  tobacco abuse.  CXR ordered,  Temporary switch to amlodipine for 2 months.   Elevated prostate specific antigen (PSA) He is asymptomatic and does not want to see a urologist despite the potential for prostate CA suggested by his elevated PSA  HYPERTRIGLYCERIDEMIA Mild, with no interest in medications.  Low GI diet recommended.  He is exercising regularly.  HYPERTENSION Well controlled on current regimen but due to continued cough, we discussed stopping lisinopril for two months.   Updated Medication List Outpatient Encounter Prescriptions as of 01/05/2013  Medication Sig Dispense Refill  . [DISCONTINUED] lisinopril-hydrochlorothiazide (ZESTORETIC) 20-12.5 MG per tablet Take 1 tablet by mouth daily.  90 tablet  3  . amLODipine (NORVASC) 5 MG tablet Take 1 tablet (5 mg total) by mouth daily.  60 tablet  0  . [DISCONTINUED] azithromycin (ZITHROMAX) 250 MG tablet Take 2 tablets on the first day and then take 1 tablet daily for the next 4 days.  6 tablet  0  . [DISCONTINUED] dextromethorphan-guaiFENesin (MUCINEX DM) 30-600 MG per 12 hr tablet Take 1 tablet by mouth every 12 (twelve) hours.      . [DISCONTINUED] HYDROcodone-homatropine (HYCODAN) 5-1.5 MG/5ML syrup Take 5 mLs by mouth every 8 (eight) hours as needed for cough.  120 mL  0   No facility-administered encounter medications on file as of 01/05/2013.     Orders Placed This Encounter  Procedures  . DG Chest 2 View    No Follow-up on file.

## 2013-01-07 ENCOUNTER — Encounter: Payer: Self-pay | Admitting: Internal Medicine

## 2013-01-07 NOTE — Assessment & Plan Note (Signed)
Well controlled on current regimen but due to continued cough, we discussed stopping lisinopril for two months.

## 2013-01-07 NOTE — Assessment & Plan Note (Signed)
Mild, with no interest in medications.  Low GI diet recommended.  He is exercising regularly.

## 2013-01-07 NOTE — Assessment & Plan Note (Signed)
Persistent daily, nonproductive.  May be due to lisinopril, allergies or endobronchial tumor or COPD given prior history of  tobacco abuse.  CXR ordered,  Temporary switch to amlodipine for 2 months.

## 2013-01-07 NOTE — Assessment & Plan Note (Signed)
He is asymptomatic and does not want to see a urologist despite the potential for prostate CA suggested by his elevated PSA

## 2013-01-09 ENCOUNTER — Other Ambulatory Visit (INDEPENDENT_AMBULATORY_CARE_PROVIDER_SITE_OTHER): Payer: Medicare Other

## 2013-01-09 DIAGNOSIS — Z1211 Encounter for screening for malignant neoplasm of colon: Secondary | ICD-10-CM

## 2013-01-10 ENCOUNTER — Ambulatory Visit (INDEPENDENT_AMBULATORY_CARE_PROVIDER_SITE_OTHER)
Admission: RE | Admit: 2013-01-10 | Discharge: 2013-01-10 | Disposition: A | Discharge status: Home / Self Care | Payer: Medicare Other | Source: Ambulatory Visit | Attending: Internal Medicine | Admitting: Internal Medicine

## 2013-01-10 ENCOUNTER — Other Ambulatory Visit: Payer: Self-pay | Admitting: *Deleted

## 2013-01-10 DIAGNOSIS — Z1211 Encounter for screening for malignant neoplasm of colon: Secondary | ICD-10-CM

## 2013-01-10 DIAGNOSIS — R05 Cough: Secondary | ICD-10-CM

## 2013-01-11 ENCOUNTER — Encounter: Payer: Self-pay | Admitting: *Deleted

## 2013-01-11 LAB — FECAL OCCULT BLOOD, IMMUNOCHEMICAL: Fecal Occult Bld: NEGATIVE

## 2013-01-23 ENCOUNTER — Observation Stay: Payer: Self-pay | Admitting: Specialist

## 2013-01-23 LAB — COMPREHENSIVE METABOLIC PANEL
Albumin: 4.2 g/dL (ref 3.4–5.0)
Alkaline Phosphatase: 86 U/L (ref 50–136)
Anion Gap: 7 (ref 7–16)
Calcium, Total: 9.3 mg/dL (ref 8.5–10.1)
Chloride: 107 mmol/L (ref 98–107)
Co2: 28 mmol/L (ref 21–32)
Creatinine: 1.41 mg/dL — ABNORMAL HIGH (ref 0.60–1.30)
EGFR (African American): 55 — ABNORMAL LOW
EGFR (Non-African Amer.): 48 — ABNORMAL LOW
Glucose: 114 mg/dL — ABNORMAL HIGH (ref 65–99)
Osmolality: 287 (ref 275–301)
Potassium: 3.7 mmol/L (ref 3.5–5.1)
SGOT(AST): 33 U/L (ref 15–37)
SGPT (ALT): 19 U/L (ref 12–78)
Sodium: 142 mmol/L (ref 136–145)
Total Protein: 7.3 g/dL (ref 6.4–8.2)

## 2013-01-23 LAB — CBC WITH DIFFERENTIAL/PLATELET
Basophil #: 0.1 10*3/uL (ref 0.0–0.1)
Eosinophil #: 0.1 10*3/uL (ref 0.0–0.7)
HCT: 49.1 % (ref 40.0–52.0)
Lymphocyte #: 3.2 10*3/uL (ref 1.0–3.6)
Lymphocyte %: 37.1 %
MCHC: 33.3 g/dL (ref 32.0–36.0)
MCV: 91 fL (ref 80–100)
Monocyte #: 0.7 x10 3/mm (ref 0.2–1.0)
Monocyte %: 7.7 %
Neutrophil %: 53.1 %
RBC: 5.4 10*6/uL (ref 4.40–5.90)
RDW: 13.2 % (ref 11.5–14.5)

## 2013-01-23 LAB — PROTIME-INR: INR: 1

## 2013-01-24 LAB — CBC WITH DIFFERENTIAL/PLATELET
Basophil #: 0 10*3/uL (ref 0.0–0.1)
Basophil #: 0 10*3/uL (ref 0.0–0.1)
Basophil %: 0.3 %
Basophil %: 0.5 %
Eosinophil #: 0 10*3/uL (ref 0.0–0.7)
Eosinophil %: 0.1 %
Eosinophil %: 0.1 %
HCT: 44.8 % (ref 40.0–52.0)
HCT: 44.3 % (ref 40.0–52.0)
HGB: 15 g/dL (ref 13.0–18.0)
Lymphocyte #: 1.1 10*3/uL (ref 1.0–3.6)
MCH: 30.2 pg (ref 26.0–34.0)
MCH: 30.9 pg (ref 26.0–34.0)
MCHC: 34.2 g/dL (ref 32.0–36.0)
MCV: 90 fL (ref 80–100)
MCV: 90 fL (ref 80–100)
Monocyte #: 0.5 x10 3/mm (ref 0.2–1.0)
Monocyte #: 0.3 x10 3/mm (ref 0.2–1.0)
Neutrophil #: 7.4 10*3/uL — ABNORMAL HIGH (ref 1.4–6.5)
Neutrophil %: 83 %
Platelet: 185 10*3/uL (ref 150–440)
RDW: 12.9 % (ref 11.5–14.5)
WBC: 12.2 10*3/uL — ABNORMAL HIGH (ref 3.8–10.6)
WBC: 9 10*3/uL (ref 3.8–10.6)

## 2013-01-24 LAB — PROTIME-INR
INR: 1.1
INR: 1.1
Prothrombin Time: 14.1 secs (ref 11.5–14.7)
Prothrombin Time: 14.3 secs (ref 11.5–14.7)

## 2013-01-24 LAB — COMPREHENSIVE METABOLIC PANEL
Albumin: 3.6 g/dL (ref 3.4–5.0)
Anion Gap: 8 (ref 7–16)
Bilirubin,Total: 0.5 mg/dL (ref 0.2–1.0)
Calcium, Total: 8.4 mg/dL — ABNORMAL LOW (ref 8.5–10.1)
Chloride: 105 mmol/L (ref 98–107)
Co2: 25 mmol/L (ref 21–32)
Glucose: 171 mg/dL — ABNORMAL HIGH (ref 65–99)
SGOT(AST): 18 U/L (ref 15–37)
SGPT (ALT): 15 U/L (ref 12–78)
Sodium: 138 mmol/L (ref 136–145)
Total Protein: 6.3 g/dL — ABNORMAL LOW (ref 6.4–8.2)

## 2013-01-24 LAB — FIBRINOGEN
Fibrinogen: 396 mg/dL (ref 210–470)
Fibrinogen: 379 mg/dL (ref 210–470)

## 2013-01-24 LAB — CK: CK, Total: 120 U/L (ref 35–232)

## 2013-02-01 ENCOUNTER — Telehealth: Payer: Self-pay | Admitting: *Deleted

## 2013-02-01 NOTE — Telephone Encounter (Signed)
Called pt to follow-up from him recent ER visit on 5/13 (discharged on 5/14). Pt states that he is doing better, walking better on his leg now (copperhead bite x 2) and doesn't feel the need to make a f/u at this time. Pt aware to contact office if his sx's do not completely resolve. Pt verbalized understanding.

## 2013-08-20 ENCOUNTER — Ambulatory Visit (INDEPENDENT_AMBULATORY_CARE_PROVIDER_SITE_OTHER): Payer: Medicare Other | Admitting: Internal Medicine

## 2013-08-20 ENCOUNTER — Encounter: Payer: Self-pay | Admitting: Internal Medicine

## 2013-08-20 VITALS — BP 128/84 | HR 73 | Temp 98.4°F | Resp 14 | Ht 68.75 in | Wt 197.2 lb

## 2013-08-20 DIAGNOSIS — E781 Pure hyperglyceridemia: Secondary | ICD-10-CM

## 2013-08-20 DIAGNOSIS — R972 Elevated prostate specific antigen [PSA]: Secondary | ICD-10-CM

## 2013-08-20 DIAGNOSIS — R5381 Other malaise: Secondary | ICD-10-CM

## 2013-08-20 DIAGNOSIS — Z1211 Encounter for screening for malignant neoplasm of colon: Secondary | ICD-10-CM

## 2013-08-20 DIAGNOSIS — R05 Cough: Secondary | ICD-10-CM

## 2013-08-20 DIAGNOSIS — E559 Vitamin D deficiency, unspecified: Secondary | ICD-10-CM

## 2013-08-20 DIAGNOSIS — Z9189 Other specified personal risk factors, not elsewhere classified: Secondary | ICD-10-CM

## 2013-08-20 DIAGNOSIS — Z0000 Encounter for general adult medical examination without abnormal findings: Secondary | ICD-10-CM

## 2013-08-20 DIAGNOSIS — E785 Hyperlipidemia, unspecified: Secondary | ICD-10-CM

## 2013-08-20 DIAGNOSIS — Z86010 Personal history of colonic polyps: Secondary | ICD-10-CM

## 2013-08-20 MED ORDER — AMLODIPINE BESYLATE 10 MG PO TABS: 10.0000 mg | ORAL_TABLET | Freq: Every day | ORAL | Status: DC

## 2013-08-20 MED ORDER — ZOSTER VACCINE LIVE 19400 UNT/0.65ML ~~LOC~~ SOLR: 0.6500 mL | Freq: Once | SUBCUTANEOUS | Status: DC

## 2013-08-20 NOTE — Progress Notes (Signed)
Patient ID: Jack [name], male   DOB: [birth year], 79 y.o.   MRN: [id number]     The patient is here for annual Medicare wellness examination and management of other chronic and acute problems.   The risk factors are reflected in the social history.  The roster of all physicians providing medical care to patient - is listed in the Snapshot section of the chart.  Activities of daily living:  The patient is 100% independent in all ADLs: dressing, toileting, feeding as well as independent mobility  Home safety : The patient has smoke detectors in the home. They wear seatbelts.  There are no firearms at home. There is no violence in the home.   There is no risks for hepatitis, STDs or HIV. There is no   history of blood transfusion. They have no travel history to infectious disease endemic areas of the world.  The patient has seen their dentist in the last six month. They have seen their eye doctor in the last year. They admit to slight hearing difficulty with regard to whispered voices and some television programs.  They have deferred audiologic testing in the last year.  They do not  have excessive sun exposure. Discussed the need for sun protection: hats, long sleeves and use of sunscreen if there is significant sun exposure.   ABIs were normal at recent hospital   screening event   Diet: the importance of a healthy diet is discussed. They do have a healthy diet.  The benefits of regular aerobic exercise were discussed. She walks 4 times per week ,  20 minutes.   Depression screen: there are no signs or vegative symptoms of depression- irritability, change in appetite, anhedonia, sadness/tearfullness.  Cognitive assessment: the patient manages all their financial and personal affairs and is actively engaged. They could relate day,date,year and events; recalled 2/3 objects at 3 minutes; performed clock-face test normally.  The following portions of the patient's history were reviewed and  updated as appropriate: allergies, current medications, past family history, past medical history,  past surgical history, past social history  and problem list.  Visual acuity was not assessed per patient preference since she has regular follow up with her ophthalmologist. Hearing and body mass index were assessed and reviewed.   During the course of the visit the patient was educated and counseled about appropriate screening and preventive services including : fall prevention , diabetes screening, nutrition counseling, colorectal cancer screening, and recommended immunizations.    Objective:  BP 128/84  Pulse 73  Temp(Src) 98.4 F (36.9 C) (Oral)  Resp 14  Ht 5' 8.75" (1.746 m)  Wt 197 lb 4 oz (89.472 kg)  BMI 29.35 kg/m2  SpO2 96%  General Appearance:    Alert, cooperative, no distress, appears stated age  Head:    Normocephalic, without obvious abnormality, atraumatic  Eyes:    PERRL, conjunctiva/corneas clear, EOM's intact, fundi    benign, both eyes       Ears:    Normal TM's and external ear canals, both ears  Nose:   Nares normal, septum midline, mucosa normal, no drainage   or sinus tenderness  Throat:   Lips, mucosa, and tongue normal; teeth and gums normal  Neck:   Supple, symmetrical, trachea midline, no adenopathy;       thyroid:  No enlargement/tenderness/nodules; no carotid   bruit or JVD  Back:     Symmetric, no curvature, ROM normal, no CVA tenderness  Lungs:  Clear to auscultation bilaterally, respirations unlabored  Chest wall:    No tenderness or deformity  Heart:    Regular rate and rhythm, S1 and S2 normal, no murmur, rub   or gallop  Abdomen:     Soft, non-tender, bowel sounds active all four quadrants,    no masses, no organomegaly  Genitalia:      Deferred per patient request   Rectal:     Deferred   Extremities:   Extremities normal, atraumatic, no cyanosis or edema  Pulses:   2+ and symmetric all extremities  Skin:   Skin color, texture, turgor  normal, no rashes or lesions  Lymph nodes:   Cervical, supraclavicular, and axillary nodes normal  Neurologic:   CNII-XII intact. Normal strength, sensation and reflexes      throughout    Assessment and Plan:   Encounter for preventive health examination Annual wellness  exam was done excluding testicular and prostate exam..  Colon ca screening was reviewed and he has been referred for clonoscopy    Personal history of colonic polyps Has been avoiding GI followup for partial resected polyp.  F/u advised, referral made to Dr Vira Agar  Elevated prostate specific antigen (PSA) He is not interested in urologic evaluation.  Cough Secondary to ACE inhibitor presumed given resolution and recurrence.  Resume amlodipine for BP control.   HYPERTRIGLYCERIDEMIA Mild, with no interest in medications.  Low GI diet recommended.  He is exercising regularly. Repeat ue    History of venomous snake bite Copperhead bite on foot last summer. Was admitted to [facility] and given Crofab.  Observed in ICU,  Leg has finally returned to normal    Updated Medication List Outpatient Encounter Prescriptions as of 08/20/2013  Medication Sig  . lisinopril-hydrochlorothiazide (PRINZIDE,ZESTORETIC) 20-12.5 MG per tablet Take 1 tablet by mouth daily.  [name] amLODipine (NORVASC) 10 MG tablet Take 1 tablet (10 mg total) by mouth daily.  [name] zoster vaccine live, PF, (ZOSTAVAX) 28413 UNT/0.65ML injection Inject 19,400 Units into the skin once.  . [DISCONTINUED] amLODipine (NORVASC) 5 MG tablet Take 1 tablet (5 mg total) by mouth daily.

## 2013-08-20 NOTE — Patient Instructions (Signed)
We are stopping lisinopril again, and starting amlodipine 10 mg daily  Goal for your blood pressure is  130/80 or less.   Return for fasting labs at your leisure   Referral to [facility] underway  Need Shingles and pneumonia vaccines  Reduce your beta carotene supplement to once daily!

## 2013-08-20 NOTE — Progress Notes (Signed)
Pre-visit discussion using our clinic review tool. No additional management support is needed unless otherwise documented below in the visit note.  

## 2013-08-21 DIAGNOSIS — Z9189 Other specified personal risk factors, not elsewhere classified: Secondary | ICD-10-CM | POA: Insufficient documentation

## 2013-08-21 DIAGNOSIS — Z860100 Personal history of colon polyps, unspecified: Secondary | ICD-10-CM | POA: Insufficient documentation

## 2013-08-21 DIAGNOSIS — Z0000 Encounter for general adult medical examination without abnormal findings: Secondary | ICD-10-CM | POA: Insufficient documentation

## 2013-08-21 NOTE — Assessment & Plan Note (Signed)
Copperhead bite on foot last summer. Was admitted to [facility] and given Crofab.  Observed in ICU,  Leg has finally returned to normal

## 2013-08-21 NOTE — Assessment & Plan Note (Signed)
Has been avoiding GI followup for partial resected polyp.  F/u advised, referral made to Dr Vira Agar

## 2013-08-21 NOTE — Assessment & Plan Note (Signed)
Annual wellness  exam was done excluding testicular and prostate exam..  Colon ca screening was reviewed and he has been referred for clonoscopy

## 2013-08-21 NOTE — Assessment & Plan Note (Signed)
He is not interested in urologic evaluation.

## 2013-08-21 NOTE — Assessment & Plan Note (Signed)
Secondary to ACE inhibitor presumed given resolution and recurrence.  Resume amlodipine for BP control.

## 2013-08-21 NOTE — Assessment & Plan Note (Signed)
Mild, with no interest in medications.  Low GI diet recommended.  He is exercising regularly. Repeat ue

## 2013-08-23 ENCOUNTER — Other Ambulatory Visit (INDEPENDENT_AMBULATORY_CARE_PROVIDER_SITE_OTHER): Payer: Medicare Other

## 2013-08-23 DIAGNOSIS — E559 Vitamin D deficiency, unspecified: Secondary | ICD-10-CM

## 2013-08-23 DIAGNOSIS — E785 Hyperlipidemia, unspecified: Secondary | ICD-10-CM

## 2013-08-23 DIAGNOSIS — R5381 Other malaise: Secondary | ICD-10-CM

## 2013-08-23 LAB — COMPREHENSIVE METABOLIC PANEL
ALT: 14 U/L (ref 0–53)
Albumin: 4.3 g/dL (ref 3.5–5.2)
Alkaline Phosphatase: 65 U/L (ref 39–117)
BUN: 23 mg/dL (ref 6–23)
CO2: 28 mEq/L (ref 19–32)
Chloride: 104 mEq/L (ref 96–112)
GFR: 49.24 mL/min — ABNORMAL LOW (ref >60.00)
Glucose, Bld: 110 mg/dL — ABNORMAL HIGH (ref 70–99)
Potassium: 4.4 mEq/L (ref 3.5–5.1)
Sodium: 139 mEq/L (ref 135–145)
Total Protein: 6.6 g/dL (ref 6.0–8.3)

## 2013-08-23 LAB — CBC WITH DIFFERENTIAL/PLATELET
Basophils Absolute: 0 10*3/uL (ref 0.0–0.1)
Basophils Relative: 0.4 % (ref 0.0–3.0)
Eosinophils Absolute: 0.1 10*3/uL (ref 0.0–0.7)
Eosinophils Relative: 1.2 % (ref 0.0–5.0)
HCT: 47.9 % (ref 39.0–52.0)
Hemoglobin: 15.9 g/dL (ref 13.0–17.0)
Lymphocytes Relative: 39.2 % (ref 12.0–46.0)
Lymphs Abs: 2.2 10*3/uL (ref 0.7–4.0)
MCHC: 33.2 g/dL (ref 30.0–36.0)
MCV: 89.9 fl (ref 78.0–100.0)
Monocytes Absolute: 0.3 10*3/uL (ref 0.1–1.0)
Monocytes Relative: 5.9 % (ref 3.0–12.0)
Neutro Abs: 3 10*3/uL (ref 1.4–7.7)
Neutrophils Relative %: 53.3 % (ref 43.0–77.0)
Platelets: 177 10*3/uL (ref 150.0–400.0)
RBC: 5.33 Mil/uL (ref 4.22–5.81)
RDW: 13.6 % (ref 11.5–14.6)
WBC: 5.7 10*3/uL (ref 4.5–10.5)

## 2013-08-23 LAB — LIPID PANEL
Cholesterol: 225 mg/dL — ABNORMAL HIGH (ref 0–200)
HDL: 43.1 mg/dL (ref >39.00)
Total CHOL/HDL Ratio: 5
VLDL: 44.6 mg/dL — ABNORMAL HIGH (ref 0.0–40.0)

## 2013-08-23 LAB — TSH: TSH: 0.25 u[IU]/mL — ABNORMAL LOW (ref 0.35–5.50)

## 2013-08-29 ENCOUNTER — Encounter: Payer: Self-pay | Admitting: Internal Medicine

## 2013-09-14 ENCOUNTER — Encounter: Payer: Self-pay | Admitting: Internal Medicine

## 2013-09-14 DIAGNOSIS — M7989 Other specified soft tissue disorders: Secondary | ICD-10-CM

## 2013-09-15 ENCOUNTER — Encounter: Payer: Self-pay | Admitting: Internal Medicine

## 2013-09-17 ENCOUNTER — Encounter: Payer: Self-pay | Admitting: Internal Medicine

## 2013-09-18 NOTE — Telephone Encounter (Signed)
Patient needs appt after his ultrasound which amber is setting up today

## 2013-09-19 ENCOUNTER — Other Ambulatory Visit: Payer: Self-pay | Admitting: Internal Medicine

## 2013-09-19 ENCOUNTER — Telehealth: Payer: Self-pay | Admitting: Emergency Medicine

## 2013-09-19 MED ORDER — FUROSEMIDE 20 MG PO TABS
20.0000 mg | ORAL_TABLET | Freq: Every day | ORAL | Status: DC
Start: 2013-09-19 — End: 2013-09-24

## 2013-09-19 MED ORDER — LOSARTAN POTASSIUM 100 MG PO TABS: 100.0000 mg | ORAL_TABLET | Freq: Every day | ORAL | Status: DC

## 2013-09-19 NOTE — Telephone Encounter (Signed)
Do you want to see patient directly after doppler or after receiving results previous message reads as though you would like same day please advise.

## 2013-09-19 NOTE — Telephone Encounter (Signed)
Jack [name], TT ordered a LLE Venous doppler on this pt. Pt is having this done tomorrow 1/8 at 1030. She stated she wanted an OV afterward. There are no open slots, please advise the patient where we can place him.

## 2013-09-19 NOTE — Telephone Encounter (Signed)
Next available is ok.  Acute slot

## 2013-09-19 NOTE — Telephone Encounter (Signed)
Can you schedule this patient next acute spot.

## 2013-09-20 NOTE — Telephone Encounter (Signed)
Pt has appointment 1/12

## 2013-09-24 ENCOUNTER — Encounter: Payer: Self-pay | Admitting: Internal Medicine

## 2013-09-24 ENCOUNTER — Ambulatory Visit (INDEPENDENT_AMBULATORY_CARE_PROVIDER_SITE_OTHER): Payer: Medicare Other | Admitting: Internal Medicine

## 2013-09-24 VITALS — BP 122/80 | HR 89 | Temp 98.1°F | Resp 14 | Wt 203.8 lb

## 2013-09-24 DIAGNOSIS — R609 Edema, unspecified: Secondary | ICD-10-CM | POA: Insufficient documentation

## 2013-09-24 DIAGNOSIS — Z79899 Other long term (current) drug therapy: Secondary | ICD-10-CM

## 2013-09-24 DIAGNOSIS — I10 Essential (primary) hypertension: Secondary | ICD-10-CM

## 2013-09-24 LAB — BASIC METABOLIC PANEL
BUN: 20 mg/dL (ref 6–23)
CALCIUM: 9.9 mg/dL (ref 8.4–10.5)
CHLORIDE: 102 meq/L (ref 96–112)
CO2: 27 mEq/L (ref 19–32)
CREATININE: 1.2 mg/dL (ref 0.4–1.5)
GFR: 59.9 mL/min — ABNORMAL LOW (ref >60.00)
Glucose, Bld: 104 mg/dL — ABNORMAL HIGH (ref 70–99)
Potassium: 4.2 mEq/L (ref 3.5–5.1)
SODIUM: 140 meq/L (ref 135–145)

## 2013-09-24 MED ORDER — LOSARTAN POTASSIUM-HCTZ 50-12.5 MG PO TABS: 1.0000 | ORAL_TABLET | Freq: Every day | ORAL | Status: DC

## 2013-09-24 NOTE — Assessment & Plan Note (Signed)
Resolved with medication change from amlodipine to lisinopril hct

## 2013-09-24 NOTE — Progress Notes (Signed)
Pre-visit discussion using our clinic review tool. No additional management support is needed unless otherwise documented below in the visit note.  

## 2013-09-24 NOTE — Patient Instructions (Signed)
We are changing your lisinopril/hct to losartan hct when you finish supply  Managing Your High Blood Pressure Blood pressure is a measurement of how forceful your blood is pressing against the walls of the arteries. Arteries are muscular tubes within the circulatory system. Blood pressure does not stay the same. Blood pressure rises when you are active, excited, or nervous; and it lowers during sleep and relaxation. If the numbers measuring your blood pressure stay above normal most of the time, you are at risk for health problems. High blood pressure (hypertension) is a long-term (chronic) condition in which blood pressure is elevated. A blood pressure reading is recorded as two numbers, such as 120 over 80 (or 120/80). The first, higher number is called the systolic pressure. It is a measure of the pressure in your arteries as the heart beats. The second, lower number is called the diastolic pressure. It is a measure of the pressure in your arteries as the heart relaxes between beats.  Keeping your blood pressure in a normal range is important to your overall health and prevention of health problems, such as heart disease and stroke. When your blood pressure is uncontrolled, your heart has to work harder than normal. High blood pressure is a very common condition in adults because blood pressure tends to rise with age. Men and women are equally likely to have hypertension but at different times in life. Before age 43, men are more likely to have hypertension. After 78 years of age, women are more likely to have it. Hypertension is especially common in African Americans. This condition often has no signs or symptoms. The cause of the condition is usually not known. Your caregiver can help you come up with a plan to keep your blood pressure in a normal, healthy range. BLOOD PRESSURE STAGES Blood pressure is classified into four stages: normal, prehypertension, stage 1, and stage 2. Your blood pressure reading  will be used to determine what type of treatment, if any, is necessary. Appropriate treatment options are tied to these four stages:  Normal  Systolic pressure (mm Hg): below 120.  Diastolic pressure (mm Hg): below 80. Prehypertension  Systolic pressure (mm Hg): 120 to 139.  Diastolic pressure (mm Hg): 80 to 89. Stage1  Systolic pressure (mm Hg): 140 to 159.  Diastolic pressure (mm Hg): 90 to 99. Stage2  Systolic pressure (mm Hg): 160 or above.  Diastolic pressure (mm Hg): 100 or above. RISKS RELATED TO HIGH BLOOD PRESSURE Managing your blood pressure is an important responsibility. Uncontrolled high blood pressure can lead to:  A heart attack.  A stroke.  A weakened blood vessel (aneurysm).  Heart failure.  Kidney damage.  Eye damage.  Metabolic syndrome.  Memory and concentration problems. HOW TO MANAGE YOUR BLOOD PRESSURE Blood pressure can be managed effectively with lifestyle changes and medicines (if needed). Your caregiver will help you come up with a plan to bring your blood pressure within a normal range. Your plan should include the following: Education  Read all information provided by your caregivers about how to control blood pressure.  Educate yourself on the latest guidelines and treatment recommendations. New research is always being done to further define the risks and treatments for high blood pressure. Lifestylechanges  Control your weight.  Avoid smoking.  Stay physically active.  Reduce the amount of salt in your diet.  Reduce stress.  Control any chronic conditions, such as high cholesterol or diabetes.  Reduce your alcohol intake. Medicines  Several medicines (antihypertensive  medicines) are available, if needed, to bring blood pressure within a normal range. Communication  Review all the medicines you take with your caregiver because there may be side effects or interactions.  Talk with your caregiver about your diet,  exercise habits, and other lifestyle factors that may be contributing to high blood pressure.  See your caregiver regularly. Your caregiver can help you create and adjust your plan for managing high blood pressure. RECOMMENDATIONS FOR TREATMENT AND FOLLOW-UP  The following recommendations are based on current guidelines for managing high blood pressure in nonpregnant adults. Use these recommendations to identify the proper follow-up period or treatment option based on your blood pressure reading. You can discuss these options with your caregiver.  Systolic pressure of 123456 to XX123456 or diastolic pressure of 80 to 89: Follow up with your caregiver as directed.  Systolic pressure of XX123456 to 0000000 or diastolic pressure of 90 to 100: Follow up with your caregiver within 2 months.  Systolic pressure above 0000000 or diastolic pressure above 123XX123: Follow up with your caregiver within 1 month.  Systolic pressure above 99991111 or diastolic pressure above [id number]: Consider antihypertensive therapy; follow up with your caregiver within 1 week.  Systolic pressure above A999333 or diastolic pressure above 123456: Begin antihypertensive therapy; follow up with your caregiver within 1 week. Document Released: 05/24/2012 Document Reviewed: 05/24/2012 [facility] Patient Information 2014 [place], [place].

## 2013-09-24 NOTE — Assessment & Plan Note (Signed)
Well controlled currently.  Amlodipine caused significant edema.  Lisinopril caused cough, recurrent.  Changing to losartan/hct.  checkin  lytes and cr today

## 2013-09-24 NOTE — Progress Notes (Signed)
Patient ID: Jack [name], male   DOB: 07/28/1933, 80 y.o.   MRN: [id number]  Patient Active Problem List   Diagnosis Date Noted  . Encounter for preventive health examination 08/21/2013  . Personal history of colonic polyps 08/21/2013  . History of venomous snake bite 08/21/2013  . Cough 10/02/2012  . Prostate hypertrophy   . Elevated prostate specific antigen (PSA)   . HYPERTRIGLYCERIDEMIA 12/21/2006  . HYPERTENSION 12/21/2006  . GERD 12/21/2006  . CALCULUS, KIDNEY 12/21/2006    Subjective:  CC:   Chief Complaint  Patient presents with  . Follow-up    for Bp meds and swelling in left leg/ patient believes side effect of BP meds    HPI:   Jack [name] a 80 y.o. male who presents Follow up on hypertension and lower extremity edema attributed to amlodipine.  He has resumed the lisinopril hct and stopped the amlodipine.  He deferred LE venous ultrasound. The edema has nearly completely resolved.  But the ACE induced cough has returned.    Past Medical History  Diagnosis Date  . History of colon polyps 2008    [facility],   . Prostate hypertrophy     diagnosed at age 82 due to hematospermia  . Elevated prostate specific antigen (PSA)     has been 7 for a year   . Hypertension     Past Surgical History  Procedure Laterality Date  . 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 Small intestine surgery  1946    implaed on picket fence, punctured stomach       The following portions of the patient's history were reviewed and updated as appropriate: Allergies, current medications, and problem list.    Review of Systems:   12 Pt  review of systems was negative except those addressed in the HPI,     History   Social History  . Marital Status: Married    Spouse Name: [name]    Number of Children: 4  . Years of Education: 16   Occupational History  . Retired     Personal assistant    Social  History Main Topics  . Smoking status: Former Smoker    Quit date: 07/05/1965  . Smokeless tobacco: Never Used  . Alcohol Use: 0.5 oz/week    1 drink(s) per week     Comment: occassionaly  . Drug Use: No  . Sexual Activity: Not on file   Other Topics Concern  . Not on file   Social History Narrative  . No narrative on file    Objective:  Filed Vitals:   09/24/13 1017  BP: 122/80  Pulse: 89  Temp: 98.1 F (36.7 C)  Resp: 14     General appearance: alert, cooperative and appears stated age Ears: normal TM's and external ear canals both ears Throat: lips, mucosa, and tongue normal; teeth and gums normal Neck: no adenopathy, no carotid bruit, supple, symmetrical, trachea midline and thyroid not enlarged, symmetric, no tenderness/mass/nodules Back: symmetric, no curvature. ROM normal. No CVA tenderness. Lungs: clear to auscultation bilaterally Heart: regular rate and rhythm, S1, S2 normal, no murmur, click, rub or gallop Abdomen: soft, non-tender; bowel sounds normal; no masses,  no organomegaly Pulses: 2+ and symmetric Skin: Skin color, texture, turgor normal. No rashes or lesions Lymph nodes: Cervical, supraclavicular, and axillary nodes normal.  Assessment and Plan:  HYPERTENSION Well controlled currently.  Amlodipine  caused significant edema.  Lisinopril caused cough, recurrent.  Changing to losartan/hct.  checkin  lytes and cr today   Edema Resolved with medication change from amlodipine to lisinopril hct   Updated Medication List Outpatient Encounter Prescriptions as of 09/24/2013  Medication Sig  . [DISCONTINUED] lisinopril-hydrochlorothiazide (PRINZIDE,ZESTORETIC) 20-12.5 MG per tablet Take 1 tablet by mouth daily.  [name] losartan-hydrochlorothiazide (HYZAAR) 50-12.5 MG per tablet Take 1 tablet by mouth daily.  [name] zoster vaccine live, PF, (ZOSTAVAX) 16109 UNT/0.65ML injection Inject 19,400 Units into the skin once.  . [DISCONTINUED] furosemide (LASIX) 20 MG tablet  Take 1 tablet (20 mg total) by mouth daily.  . [DISCONTINUED] losartan (COZAAR) 100 MG tablet Take 1 tablet (100 mg total) by mouth daily.

## 2013-09-25 ENCOUNTER — Encounter: Payer: Self-pay | Admitting: Internal Medicine

## 2013-10-17 ENCOUNTER — Telehealth: Payer: Self-pay | Admitting: Internal Medicine

## 2013-10-17 NOTE — Telephone Encounter (Signed)
Relevant patient education assigned to patient using Emmi. ° °

## 2013-10-26 ENCOUNTER — Ambulatory Visit: Payer: Self-pay | Admitting: Unknown Physician Specialty

## 2013-11-09 ENCOUNTER — Telehealth: Payer: Self-pay | Admitting: Internal Medicine

## 2013-11-09 DIAGNOSIS — R972 Elevated prostate specific antigen [PSA]: Secondary | ICD-10-CM

## 2013-11-09 DIAGNOSIS — N4289 Other specified disorders of prostate: Secondary | ICD-10-CM

## 2013-11-09 NOTE — Telephone Encounter (Signed)
Please let patient know that I am referring him to Urology.  Not only has he had an elevated PSA that he is aware of but deferred referral,  But now Dr Vira Agar says he felt a mass in his prostate when he did is colonoscopy last week

## 2013-11-12 NOTE — Telephone Encounter (Signed)
Pt is already s/u to see [facility] on 3/11

## 2013-11-19 ENCOUNTER — Telehealth: Payer: Self-pay | Admitting: *Deleted

## 2013-11-19 NOTE — Telephone Encounter (Signed)
Pt walked in to office, complaining of cough x 2 weeks, denied fever. Advised no appointments in office today, but [facility] had some openings or offered appt in office for tomorrow. Pt walked to front desk to schedule appointment at [facility] office for today.

## 2013-11-25 ENCOUNTER — Telehealth: Payer: Self-pay | Admitting: Internal Medicine

## 2013-11-25 NOTE — Telephone Encounter (Signed)
Received a my chart message form patient because he walked in last Monday to be seen for bronchitis and was not able to be seen.  Please find out if he was offered an appt with anyone else at our office or [facility].

## 2013-11-26 NOTE — Telephone Encounter (Signed)
Left message for patient to call office But there is a phone note in advising patient to make an appointment with [facility]. Patient was directed to go front desk and schedule.  Wynonia Lawman RN triaged patient.

## 2013-12-18 ENCOUNTER — Telehealth: Payer: Self-pay | Admitting: *Deleted

## 2013-12-18 NOTE — Telephone Encounter (Signed)
Refill Request  Amlodipine Besylate 10 mg tab  #90  Take one tablet (10mg  total) by mouth daily

## 2013-12-19 NOTE — Telephone Encounter (Signed)
Left message for patient to call office.  

## 2013-12-19 NOTE — Telephone Encounter (Signed)
Refill request but office note from 09/24/13 stated that amlodipine was stopped due to leg swelling. Please advise.

## 2013-12-19 NOTE — Telephone Encounter (Signed)
Correct.,  Call patient and find out what he has been taking ,, losartan or amlodipine

## 2013-12-20 ENCOUNTER — Encounter: Payer: Self-pay | Admitting: Internal Medicine

## 2014-02-06 ENCOUNTER — Encounter: Payer: Self-pay | Admitting: Internal Medicine

## 2014-02-06 DIAGNOSIS — Z125 Encounter for screening for malignant neoplasm of prostate: Secondary | ICD-10-CM

## 2014-03-05 ENCOUNTER — Encounter: Payer: Self-pay | Admitting: Internal Medicine

## 2014-03-05 DIAGNOSIS — N400 Enlarged prostate without lower urinary tract symptoms: Secondary | ICD-10-CM

## 2014-03-05 DIAGNOSIS — R972 Elevated prostate specific antigen [PSA]: Secondary | ICD-10-CM

## 2014-03-06 NOTE — Telephone Encounter (Signed)
Per patient's e mail , requesting referral to the [facility] de[facility]

## 2014-03-27 ENCOUNTER — Encounter: Payer: Self-pay | Admitting: Internal Medicine

## 2014-04-03 ENCOUNTER — Encounter: Payer: Self-pay | Admitting: Internal Medicine

## 2014-04-03 NOTE — Telephone Encounter (Signed)
Looks like this was started.Marland KitchenMarland Kitchen

## 2014-06-19 ENCOUNTER — Encounter: Payer: Self-pay | Admitting: Internal Medicine

## 2014-06-20 ENCOUNTER — Other Ambulatory Visit: Payer: Self-pay | Admitting: Internal Medicine

## 2014-06-20 DIAGNOSIS — E041 Nontoxic single thyroid nodule: Secondary | ICD-10-CM

## 2014-06-24 ENCOUNTER — Encounter: Payer: Self-pay | Admitting: Internal Medicine

## 2014-07-01 ENCOUNTER — Ambulatory Visit: Payer: Self-pay | Admitting: Internal Medicine

## 2014-07-03 ENCOUNTER — Telehealth: Payer: Self-pay | Admitting: Internal Medicine

## 2014-07-03 DIAGNOSIS — E042 Nontoxic multinodular goiter: Secondary | ICD-10-CM

## 2014-07-09 ENCOUNTER — Encounter: Payer: Self-pay | Admitting: *Deleted

## 2014-07-09 ENCOUNTER — Ambulatory Visit (INDEPENDENT_AMBULATORY_CARE_PROVIDER_SITE_OTHER): Payer: Medicare Other | Admitting: Endocrinology

## 2014-07-09 ENCOUNTER — Encounter: Payer: Self-pay | Admitting: Internal Medicine

## 2014-07-09 ENCOUNTER — Encounter: Payer: Self-pay | Admitting: Endocrinology

## 2014-07-09 VITALS — BP 122/78 | HR 81 | Wt 200.5 lb

## 2014-07-09 DIAGNOSIS — E042 Nontoxic multinodular goiter: Secondary | ICD-10-CM

## 2014-07-09 LAB — TSH: TSH: 1.04 u[IU]/mL (ref 0.35–4.50)

## 2014-07-09 LAB — T4, FREE: Free T4: 1.04 ng/dL (ref 0.60–1.60)

## 2014-07-09 NOTE — Patient Instructions (Signed)
Labs today for thyroid.  If labs today are normal, then would proceed with left mid nodule FNA.  If TSH is low again, then would proceed with nuclear scan for diagnosis of possible hot nodules.   Please come back for a follow-up appointment in 3 months

## 2014-07-09 NOTE — Progress Notes (Signed)
Pre visit review using our clinic review tool, if applicable. No additional management support is needed unless otherwise documented below in the visit note. 

## 2014-07-09 NOTE — Progress Notes (Addendum)
Reason for  Visit-  Jack [name] is a 80 y.o.-year-old male, referred by his PCP,  [name], MD, for evaluation for Multinodular goiter.  HPI-  The patient reports being diagnosed with thyroid nodules since October 2015. This was an incidental finding discovered on recent health screening done by carotid ultrasound. The patient does note a small area of tenderness closer to his right mastoid for a couple of weeks prior to this ultrasound. There has been interim resolution of pain in the mastoid area. Some residual tenderness in the right upper neck is still persistent, without any lumps that are noted by the patient.  his thyroid ultrasound was done  07/01/14 at [facility]. It showed a multinodular goiter with a dominant  Left mid anterior1.6 x 1 x 1.2 cm mixed echogenic, partly cystic, predominantly solid with internal echogeniety suggestive of microcalcifications. It also showed additional subcentimeter nodules in the left lobe of the thyroid and ill-defined, at least 2 subcentimeter nodules in the right lobe of the thyroid. No lymphadenopathy was noted. No isthmic nodules.     he has a family history of thyroid disorders in his sister- she has a history of thyroid cancer. The patient denies any family history of thyroid cancer in other members of his family or personal history of XRT to his neck area.    The patient denies any prior history of thyroid dysfunction. He does have a borderline low TSH level on his last check done in 2014.  The patient is currently not on any thyroid hormone.  I reviewed patient's thyroid tests: Lab Results  Component Value Date   TSH 0.25* 08/23/2013   TSH 1.06 07/14/2012       Patient denies feeling nodules in neck, hoarseness, dysphagia/odynophagia, SOB with lying down. The patient denies any specific symptoms of hypo-or hyperthyroidism as detailed below.   I have reviewed the patient's past medical history, family and social history, surgical history,  medications and allergies.  Past Medical History  Diagnosis Date  . History of colon polyps 2008    [facility],   . Prostate hypertrophy     diagnosed at age 61 due to hematospermia  . Elevated prostate specific antigen (PSA)     has been 7 for a year   . Hypertension    Past Surgical History  Procedure Laterality Date  . 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 Small intestine surgery  1946    implaed on picket fence, punctured stomach   History   Social History  . Marital Status: Married    Spouse Name: [name]    Number of Children: 4  . Years of Education: 16   Occupational History  . Retired     Personal assistant    Social History Main Topics  . Smoking status: Former Smoker    Quit date: 07/05/1965  . Smokeless tobacco: Never Used  . Alcohol Use: 0.5 oz/week    1 drink(s) per week     Comment: occassionaly  . Drug Use: No  . Sexual Activity: Not on file   Other Topics Concern  . Not on file   Social History Narrative  . No narrative on file   Current Outpatient Prescriptions on File Prior to Visit  Medication Sig Dispense Refill  . losartan-hydrochlorothiazide (HYZAAR) 50-12.5 MG per tablet Take 1 tablet by mouth daily.  90 tablet  3  . zoster vaccine  live, PF, (ZOSTAVAX) 29562 UNT/0.65ML injection Inject 19,400 Units into the skin once.  1 each  0   No current facility-administered medications on file prior to visit.   Allergies  Allergen Reactions  . Amlodipine     LE edema  . Azithromycin Nausea And Vomiting  . Lisinopril Cough   Family History  Problem Relation Age of Onset  . Hypertension Father   . Hyperlipidemia Father     Review of Systems: [x]  complains of  [  ] denies General:   [  ] Recent weight change [  ] Fatigue  [  ] Loss of appetite Eyes: [  ]  Vision Difficulty [  ]  Eye pain ENT: [  ]  Hearing difficulty [  ]  Difficulty Swallowing CVS: [  ] Chest pain [  ]   Palpitations/Irregular Heart beat [  ]  Shortness of breath lying flat [  ] Swelling of legs Resp: [  ] Frequent Cough [  ] Shortness of Breath  [  ]  Wheezing GI: [  ] Heartburn  [  ] Nausea or Vomiting  [  ] Diarrhea [  ] Constipation  [  ] Abdominal Pain GU: [  ]  Polyuria  [  ]  nocturia Bones/joints:  [  ]  Muscle aches  [  ] Joint Pain  [  ] Bone pain Skin/Hair/Nails: [  ]  Rash  [  ] New stretch marks [  ]  Itching [  ] Hair loss [  ]  Excessive hair growth Reproduction: [  ] Low sexual desire , [  ]  Women: Menstrual cycle problems [  ]  Women: Breast Discharge [  ] Men: Difficulty with erections [  ]  Men: Enlarged Breasts CNS: [  ] Frequent Headaches [  ] Blurry vision [  ] Tremors [  ] Seizures [  ] Loss of consciousness [  ] Localized weakness Endocrine: [  ]  Excess thirst [  ]  Feeling excessively hot [  ]  Feeling excessively cold Heme: [  ]  Easy bruising [  ]  Enlarged glands or lumps in neck Allergy: [  ]  Food allergies [  ] Environmental allergies  Physical Exam- BP 122/78  Pulse 81  Wt 200 lb 8 oz (90.946 kg)  SpO2 97% Wt Readings from Last 3 Encounters:  07/09/14 200 lb 8 oz (90.946 kg)  09/24/13 203 lb 12 oz (92.42 kg)  08/20/13 197 lb 4 oz (89.472 kg)    HEENT: [place]/AT, EOMI, no icterus, no proptosis, no chemosis, no mild lid lag, no retraction, eyes close completely Neck: thyroid gland - irregular left lobe, non-tender, no erythema, no tracheal deviation; negative Pemberton's sign; no lympahadenopathy; no bruits Lungs: good air entry, clear bilaterally Heart: S1&S2 normal, regular rate & rhythm; no murmurs, rubs or gallops Abd: soft, NT, ND, no HSM, +BS Ext: no tremor in hands bilaterally, no edema, 2+ DP/PT pulses, good muscle mass Neuro: normal gait, 2+ reflexes bilaterally, normal 5/5 strength, no proximal myopathy  Derm: no pretibial myxoedema/skin dryness  ASSESSMENT- 1. Multinodular goiter   PLAN: Problem List Items Addressed This Visit      Endocrine   Multinodular goiter - Primary     Ultrasound report reviewed by me and discussed with the patient. Not able to review the images. We discussed that thyroid nodules are common in the population and carry a 5-15% risk of thyroid cancer. He does  have a FH of thyroid cancer in his sister. There is no noninvasive test to differentiate between benign and malignant nodules. We discussed about thyroid nodule FNA and possible pathology reports and follow up based on these results.  I recommend FNA of the dominant left 1.6 cm nodule given the recent thyroid Ultrasound findings, if his labs from today are normal.  We discussed that last year he had a low TSH level, and I would like to repeat that today. If TSH is low again, then would proceed with diagnostic nuclear scan to rule out hot nodules prior to biopsy. Discussed with the patient regarding thyroid hormone dysfunction and briefly about symptoms of hypo-and hyperthyroidism. Appears clinically euthyroid today.   The patient is agreeable  to the plan mentioned above.   Return to clinic in 3 months.     Relevant Orders      TSH      T4, free       Alyshia Kernan PUSHKAR 07/09/2014 1:21 PM  Labs from today. Normal thyroid levels. FNA ordered.  Lab Results  Component Value Date   TSH 1.04 07/09/2014   .Jerie Basford PUSHKAR 3:22 PM  DIAGNOSIS: Comment   Comments: LEFT LOBE THYROID  NEGATIVE FOR MALIGNANT CELLS.  SPECIMEN CONSISTS OF BENIGN FOLLICULAR CELLS, HEMOSIDERIN-LADEN  MACROPHAGES, COLLOID, AND BLOOD. THIS PATTERN IS CONSISTENT WITH A COLLOID  NODULE.     FNA done at [facility] 07/18/14  Jaclyn Carew [facility] 07/31/2014 8:55 AM

## 2014-07-09 NOTE — Assessment & Plan Note (Addendum)
Ultrasound report reviewed by me and discussed with the patient. Not able to review the images. We discussed that thyroid nodules are common in the population and carry a 5-15% risk of thyroid cancer. He does have a FH of thyroid cancer in his sister. There is no noninvasive test to differentiate between benign and malignant nodules. We discussed about thyroid nodule FNA and possible pathology reports and follow up based on these results.  I recommend FNA of the dominant left 1.6 cm nodule given the recent thyroid Ultrasound findings, if his labs from today are normal.  We discussed that last year he had a low TSH level, and I would like to repeat that today. If TSH is low again, then would proceed with diagnostic nuclear scan to rule out hot nodules prior to biopsy. Discussed with the patient regarding thyroid hormone dysfunction and briefly about symptoms of hypo-and hyperthyroidism. Appears clinically euthyroid today.   The patient is agreeable  to the plan mentioned above.   Return to clinic in 3 months.

## 2014-07-18 ENCOUNTER — Other Ambulatory Visit: Payer: Medicare Other

## 2014-07-18 ENCOUNTER — Ambulatory Visit (INDEPENDENT_AMBULATORY_CARE_PROVIDER_SITE_OTHER): Payer: Medicare Other | Admitting: General Surgery

## 2014-07-18 ENCOUNTER — Encounter: Payer: Self-pay | Admitting: General Surgery

## 2014-07-18 VITALS — BP 120/84 | HR 76 | Resp 12 | Ht 69.0 in | Wt 196.0 lb

## 2014-07-18 DIAGNOSIS — E041 Nontoxic single thyroid nodule: Secondary | ICD-10-CM

## 2014-07-18 NOTE — Progress Notes (Signed)
Patient ID: [name], male   DOB: 1933/08/24, 80 y.o.   MRN: [id number]  Chief Complaint  Patient presents with  . Thyroid Nodule    HPI Jack [name] is a 80 y.o. male who presents for an evaluation of a thyroid nodule. The patient states he went to a free Vascular screening and a sonogram was done and a thyroid nodule was noted. He denies any problems at this time. He denies having difficulty with swallowing. No unusual fever or chills.   HPI  Past Medical History  Diagnosis Date  . History of colon polyps 2008    [facility],   . Prostate hypertrophy     diagnosed at age 68 due to hematospermia  . Elevated prostate specific antigen (PSA)     has been 7 for a year   . Hypertension   . Hyperlipidemia   . Kidney stones     Past Surgical History  Procedure Laterality Date  . 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 Small intestine surgery  1946    implaed on picket fence, punctured stomach    Family History  Problem Relation Age of Onset  . Hypertension Father   . Hyperlipidemia Father   . Cancer Sister     thyroid - dx in late 20's    Social History History  Substance Use Topics  . Smoking status: Former Smoker    Quit date: 07/05/1965  . Smokeless tobacco: Never Used  . Alcohol Use: 0.6 oz/week    1 Not specified per week     Comment: occassionaly    Allergies  Allergen Reactions  . Amlodipine     LE edema  . Azithromycin Nausea And Vomiting  . Lisinopril Cough    Current Outpatient Prescriptions  Medication Sig Dispense Refill  . Ascorbic Acid (VITAMIN C) 1000 MG tablet Take 1,000 mg by mouth daily.    . beta carotene 25000 UNIT capsule Take 25,000 Units by mouth daily.    . cholecalciferol (VITAMIN D) 1000 UNITS tablet Take 1,000 Units by mouth daily.    [name] losartan-hydrochlorothiazide (HYZAAR) 50-12.5 MG per tablet Take 1 tablet by mouth daily. 90 tablet 3  . Magnesium 250 MG  TABS Take 2 tablets by mouth daily.    . Misc Natural Products (OSTEO BI-FLEX ADV DOUBLE ST PO) Take 2 tablets by mouth daily.    . Multiple Vitamins-Minerals (PRESERVISION AREDS 2 PO) Take 2 capsules by mouth daily.    . Omega-3 Fatty Acids (FISH OIL) 1200 MG CAPS Take 2 capsules by mouth daily.    . Plant Sterols and Stanols (CHOLESTOFF) 450 MG TABS Take 2 tablets by mouth daily.    . tadalafil (CIALIS) 5 MG tablet Take 5 mg by mouth daily as needed for erectile dysfunction.    . thiamine (VITAMIN B-1) 50 MG tablet Take 50 mg by mouth daily.    . Turmeric 500 MG CAPS Take 1 capsule by mouth daily.    . Zinc 50 MG CAPS Take 1 capsule by mouth daily.     No current facility-administered medications for this visit.    Review of Systems Review of Systems  Constitutional: Negative.   Respiratory: Negative.   Cardiovascular: Negative.     Blood pressure 120/84, pulse 76, resp. rate 12, height 5\' 9"  (1.753 m), weight 196 lb (88.905 kg).  Physical Exam Physical Exam  Constitutional: He is oriented  to person, place, and time. He appears well-developed and well-nourished.  Neck: Neck supple. No thyromegaly present.  Mild fullness of left thyroid lobe  Cardiovascular: Normal rate, regular rhythm and normal heart sounds.   No murmur heard. Pulmonary/Chest: Effort normal and breath sounds normal.  Lymphadenopathy:    He has no cervical adenopathy.    He has no axillary adenopathy.  Neurological: He is alert and oriented to person, place, and time.  Skin: Skin is warm and dry.    Data Reviewed Recent thyroid US- Multiple nodules seen. One dominant nodule in left lobe with some calcifications.  Assessment    Likely multinodular goiter. One dominant nodule on left has micro calcifications and FNA of this was recommended.      Plan    FNA of the dominant nodule left lobe of thyroid completed today with US guidance. 22 g needle used and 2 ml 1% xylocaine used. Await cytology report         Christene Lye 07/19/2014, 6:34 AM

## 2014-07-18 NOTE — Patient Instructions (Signed)
The patient is aware to call back for any questions or concerns.  

## 2014-07-19 ENCOUNTER — Encounter: Payer: Self-pay | Admitting: General Surgery

## 2014-07-22 LAB — FINE-NEEDLE ASPIRATION

## 2014-07-23 ENCOUNTER — Telehealth: Payer: Self-pay | Admitting: *Deleted

## 2014-07-23 NOTE — Telephone Encounter (Signed)
-----   Message from Christene Lye, MD sent at 07/22/2014  8:53 PM EST ----- Rosann Auerbach, please let pt know of benign path report. Will need to see him back in 6 mos for a recheck and US thyroid here in office.

## 2014-07-23 NOTE — Telephone Encounter (Signed)
Notified patient as instructed, patient pleased. Discussed follow-up appointments, patient agrees  

## 2014-07-24 ENCOUNTER — Encounter: Payer: Self-pay | Admitting: Internal Medicine

## 2014-07-30 ENCOUNTER — Ambulatory Visit (INDEPENDENT_AMBULATORY_CARE_PROVIDER_SITE_OTHER): Payer: Medicare Other

## 2014-07-30 DIAGNOSIS — Z23 Encounter for immunization: Secondary | ICD-10-CM

## 2014-07-31 ENCOUNTER — Telehealth: Payer: Self-pay

## 2014-07-31 NOTE — Telephone Encounter (Signed)
LVM for pt to call back.   RE: schedule AWV for 2015

## 2014-08-02 ENCOUNTER — Encounter: Payer: Self-pay | Admitting: Internal Medicine

## 2014-09-16 ENCOUNTER — Other Ambulatory Visit: Payer: Self-pay | Admitting: *Deleted

## 2014-09-16 MED ORDER — LOSARTAN POTASSIUM-HCTZ 50-12.5 MG PO TABS: 1.0000 | ORAL_TABLET | Freq: Every day | ORAL | Status: DC

## 2014-12-13 ENCOUNTER — Encounter: Payer: Self-pay | Admitting: Internal Medicine

## 2015-01-03 NOTE — Discharge Summary (Signed)
PATIENT NAME:  Jack [name], Jack [name] MR#:  [id number] DATE OF BIRTH:  1933/04/25  DATE OF ADMISSION:  01/23/2013 DATE OF DISCHARGE:  01/24/2013  HISTORY AND PHYSICAL:  For a detailed note, please take a look at the history and physical done on admission by Dr. Margaretmary Eddy.    DISCHARGE DIAGNOSES:  1.  A copperhead snake bite.  2.  Hypertension.  3.  Acute renal failure.   DIET:  The patient is being discharged on a low-sodium diet.   ACTIVITY: As tolerated.   FOLLOWUP: With Dr. Deborra Medina in the next 1 to 2 weeks.   DISCHARGE MEDICATIONS: Norvasc 5 mg daily.   PERTINENT HOSPITAL LABORATORY DATA:  A creatinine on admission noted to be as high as 1.4, up to 1.5 the day after admission.   BRIEF HOSPITAL COURSE: This is a 81 year old male who presented to the hospital on 01/23/2013 after being bitten by a snake twice.  It was thought to be a copperhead snake.   1.  Copperhead snakebite:  The patient was admitted to the hospital, given antivenom ,observed overnight on the intensive care unit. He was monitored for evidence of rhabdomyolysis, for coagulopathy, although he did not show any evidence of that. His INR remained stable.  His fibrinogen level remained stable.  His D-dimer levels remained stable.  His CK and CK-MB also remained normal. The patient still has some localized erythema on the left foot area where he was bitten and some redness although no other associated symptoms of bronchospasm or anaphylactic shock. He is, therefore, being discharged home.  2.  Acute renal failure: This is probably secondary to dehydration and from his snakebite. The patient's creatinine is still a little bit abnormal, although he has been encouraged to take fluids when he goes home. His renal function can be further followed up by his primary care physician.  3.  Hypertension: The patient remained hemodynamically stable in the hospital. His antihypertensives were held, although he can resume those upon discharge.    CODE STATUS: The patient is a FULL CODE.   TIME SPENT: 35 minutes.  ____________________________ Belia Heman. Verdell Carmine, MD vjs:cb D: 01/24/2013 15:52:52 ET T: 01/24/2013 17:06:14 ET JOB#: [id number]  cc: Belia Heman. Verdell Carmine, MD, <Dictator> Deborra Medina, MD Henreitta Leber MD ELECTRONICALLY SIGNED 01/30/2013 [id number]

## 2015-01-03 NOTE — H&P (Signed)
PATIENT NAME:  Jack [name], Jack [name] MR#:  [id number] DATE OF BIRTH:  October 18, 1932  DATE OF ADMISSION:  01/23/2013  PRIMARY CARE PHYSICIAN:  Dr. Deborra Medina.  REFERRING PHYSICIAN:  Dr. Owens Shark.  CHIEF COMPLAINT:  Copperhead snake bite.   HISTORY OF PRESENT ILLNESS:  The patient is a 81 year old Caucasian male with a past medical history of hypertension, went out to get some water and was standing just outside the garage and did not noticed a copperhead snake lying next to him.  The snake bit him on the left lateral aspect of the foot x 2 at around 8:00 p.m. last night.  The patient was immediately brought into the ER at around 8:10 p.m.  The patient has some cyanosis on the left foot associated with ecchymosis and ankle edema.  The patient was immediately started on CroFab by the ER physician.  Hospitalist team is called to admit the patient.  The patient was given morphine and Dilaudid for pain management.  He denies any chest pain or shortness of breath, but complaining of nausea.  No similar complaints in the past.  Initially the pain in the foot was around 10, but after getting morphine and Dilaudid the patient's pain went down to 2 out of 10.  The patient could feel his left lower extremity and denies any numbness or tingling.  No other complaints.  Poison Control was called who has recommended to check his labs every 6 hours.   PAST MEDICAL HISTORY:  Hypertension.   PAST SURGICAL HISTORY:  Right knee repair.   ALLERGIES:  The patient has no known drug allergies.   HOME MEDICATIONS:  Need to be reconciled.   PSYCHOSOCIAL HISTORY:  Lives at home with wife.  No history of smoking, alcohol or illicit drug usage.   FAMILY HISTORY:  Mother is very healthy, age is 92, still drives and cooks.  No medical issues.   REVIEW OF SYSTEMS:  CONSTITUTIONAL:  Denies any fever, fatigue.  Complaining of left lower extremity pain.  No weight loss or weight gain.  EYES:  Denies any blurry vision, redness,  inflammation, glaucoma, cataracts.  EARS, NOSE, THROAT:  Denies any ear pain, snoring, postnasal drip or sinus pain.  RESPIRATIONS:  Denies any cough, COPD or dyspnea.  CARDIOVASCULAR:  No chest pain, palpitations or syncope.  GASTROINTESTINAL:  Complaining of nausea after getting pain medicine.  Denies any abdominal pain, hematemesis, or melena.  GENITOURINARY:  No dysuria or hematuria.  He denies any hernia or prostatitis.  ENDOCRINE:  No polyuria, nocturia, thyroid problems.  HEMATOLOGIC AND LYMPHATIC:  No anemia, easy bruising or bleeding.  INTEGUMENTARY:  No acne, rash, lesions other than the left lower extremity snake bite.  MUSCULOSKELETAL:  No pain in the neck, back, shoulder or knee, has left foot pain from snake bite associated with some ecchymosis, cyanosis and edema.  NEUROLOGIC:  Denies any vertigo, ataxia, dementia, dysarthria. PSYCHIATRIC:  Denies any ADD, OCD or bipolar disorder.   PHYSICAL EXAMINATION: VITAL SIGNS:  Initial temperature was 97.6, pulse flexes in between 63 to 87, respirations 18 to 20, blood pressure initially 187/107, probably from anxiety and apprehension.  Eventually it went down to 152/98, pulse ox 94% on 2 liters.  GENERAL APPEARANCE:  Not in any acute distress.  Moderately built and moderately nourished.  HEENT:  Normocephalic, atraumatic.  Pupils are equally reactive to light and accommodation.  Extraocular movements are intact.  No sinus tenderness.  No postnasal drip.  No pharyngeal exudate.  NECK:  Supple.  No JVD.  No thyromegaly.  No lymphadenopathy.  LUNGS:  Clear to auscultation bilaterally.  No accessory muscle usage.  No anterior chest wall tenderness on palpation.  CARDIOVASCULAR:  S1, S2 normal.  Regular rate and rhythm.  No murmurs.  No gallops.  GASTROINTESTINAL:  Soft.  Bowel sounds are positive in all four quadrants.  Nontender, nondistended.  No masses.  No hepatosplenomegaly.  NEUROLOGIC:  Awake, alert, oriented x 3.  Cranial nerves II  through XII are grossly intact.  Motor and sensory is grossly intact including the left lower extremity.  Reflexes are 2+.  MUSCULOSKELETAL:  Left lower extremity, the left ankle is edematous, cyanotic with ecchymosis and edema from copperhead snake bite.  The rest of the joints, no joint effusion, tenderness, erythema.  SKIN:  Warm to touch, cyanotic with ecchymosis on the left lower foot in the site of snake bite, but dorsalis pedis and posterior tibialis pulse are 2+ on both lower extremities.  Sensory is intact.   LABORATORY AND IMAGING STUDIES: WBC 8.6, CK 182, hemoglobin 16.3, hematocrit 49.1, platelets 210. INR 1.0, glucose 114, BUN 21, creatinine 1.41, sodium 142, potassium 3.7, chloride 107, CO2 28, GFR 48, anion gap 7, serum osmolality 287, calcium 9.3.  LFTs are within normal limits.   ASSESSMENT AND PLAN:   1.  Left lateral foot copperhead snake bite.  The patient will be admitted to critical care unit.  We will provide him IV fluids.  Currently he is getting CroFab antivenom which will be continued.  Awaiting Poison Control recommendations.  Morphine as needed for pain management.  We will monitor him closely for rhabdomyolysis, coagulopathic shock and compartment syndrome.  We will check labs including PT, INR, CPK, CBC, BMP, fibrinogen and D-dimer every 6 hours.  We will follow with Poison Control as needed basis.  The patient will be given tetanus IM shot if he has not received it recently.  2.  Acute renal insufficiency.  We will monitor renal function and we will provide him IV fluids.  3.  Left lower extremity pain.  We will give him morphine.  4.  Hypertension.  We will give him beta-blocker.  5.  We will provide him gastrointestinal prophylaxis with ranitidine and deep vein thrombosis prophylaxis with heparin subQ.  6.  CODE STATUS:  He is FULL CODE.  Wife is his POA medically.   Total time spent on admission is 50 minutes.   The diagnosis and plan of care was discussed in detail  with the patient and his wife and other significant family members at bedside.  They all verbalized understanding of the plan.     ____________________________ Nicholes Mango, MD ag:ea D: 01/24/2013 00:14:41 ET T: 01/24/2013 00:53:30 ET JOB#: [id number]  cc: Nicholes Mango, MD, <Dictator> Deborra Medina, MD  Nicholes Mango MD ELECTRONICALLY SIGNED 01/31/2013 1:11

## 2015-01-23 ENCOUNTER — Ambulatory Visit: Payer: Self-pay | Admitting: General Surgery

## 2015-02-03 DIAGNOSIS — N400 Enlarged prostate without lower urinary tract symptoms: Secondary | ICD-10-CM | POA: Insufficient documentation

## 2015-03-10 ENCOUNTER — Other Ambulatory Visit: Payer: Self-pay

## 2015-10-14 DIAGNOSIS — G609 Hereditary and idiopathic neuropathy, unspecified: Secondary | ICD-10-CM | POA: Diagnosis not present

## 2015-10-15 DIAGNOSIS — H353211 Exudative age-related macular degeneration, right eye, with active choroidal neovascularization: Secondary | ICD-10-CM | POA: Diagnosis not present

## 2015-11-24 DIAGNOSIS — R200 Anesthesia of skin: Secondary | ICD-10-CM | POA: Diagnosis not present

## 2015-11-24 DIAGNOSIS — E559 Vitamin D deficiency, unspecified: Secondary | ICD-10-CM | POA: Diagnosis not present

## 2015-11-26 DIAGNOSIS — H353211 Exudative age-related macular degeneration, right eye, with active choroidal neovascularization: Secondary | ICD-10-CM | POA: Diagnosis not present

## 2015-12-08 DIAGNOSIS — G608 Other hereditary and idiopathic neuropathies: Secondary | ICD-10-CM | POA: Diagnosis not present

## 2016-01-19 DIAGNOSIS — Z0000 Encounter for general adult medical examination without abnormal findings: Secondary | ICD-10-CM | POA: Diagnosis not present

## 2016-01-23 DIAGNOSIS — H353211 Exudative age-related macular degeneration, right eye, with active choroidal neovascularization: Secondary | ICD-10-CM | POA: Diagnosis not present

## 2016-02-11 DIAGNOSIS — M546 Pain in thoracic spine: Secondary | ICD-10-CM | POA: Diagnosis not present

## 2016-02-11 DIAGNOSIS — R399 Unspecified symptoms and signs involving the genitourinary system: Secondary | ICD-10-CM | POA: Diagnosis not present

## 2016-02-25 DIAGNOSIS — G608 Other hereditary and idiopathic neuropathies: Secondary | ICD-10-CM | POA: Diagnosis not present

## 2016-03-29 DIAGNOSIS — H353211 Exudative age-related macular degeneration, right eye, with active choroidal neovascularization: Secondary | ICD-10-CM | POA: Diagnosis not present

## 2016-05-04 DIAGNOSIS — L905 Scar conditions and fibrosis of skin: Secondary | ICD-10-CM | POA: Diagnosis not present

## 2016-05-04 DIAGNOSIS — L821 Other seborrheic keratosis: Secondary | ICD-10-CM | POA: Diagnosis not present

## 2016-05-04 DIAGNOSIS — C44612 Basal cell carcinoma of skin of right upper limb, including shoulder: Secondary | ICD-10-CM | POA: Diagnosis not present

## 2016-05-04 DIAGNOSIS — X32XXXA Exposure to sunlight, initial encounter: Secondary | ICD-10-CM | POA: Diagnosis not present

## 2016-05-04 DIAGNOSIS — L570 Actinic keratosis: Secondary | ICD-10-CM | POA: Diagnosis not present

## 2016-05-04 DIAGNOSIS — D485 Neoplasm of uncertain behavior of skin: Secondary | ICD-10-CM | POA: Diagnosis not present

## 2016-05-04 DIAGNOSIS — D0439 Carcinoma in situ of skin of other parts of face: Secondary | ICD-10-CM | POA: Diagnosis not present

## 2016-05-04 DIAGNOSIS — D1801 Hemangioma of skin and subcutaneous tissue: Secondary | ICD-10-CM | POA: Diagnosis not present

## 2016-05-13 DIAGNOSIS — L905 Scar conditions and fibrosis of skin: Secondary | ICD-10-CM | POA: Diagnosis not present

## 2016-05-13 DIAGNOSIS — C44612 Basal cell carcinoma of skin of right upper limb, including shoulder: Secondary | ICD-10-CM | POA: Diagnosis not present

## 2016-05-27 DIAGNOSIS — D0439 Carcinoma in situ of skin of other parts of face: Secondary | ICD-10-CM | POA: Diagnosis not present

## 2016-05-31 DIAGNOSIS — B9689 Other specified bacterial agents as the cause of diseases classified elsewhere: Secondary | ICD-10-CM | POA: Diagnosis not present

## 2016-05-31 DIAGNOSIS — J208 Acute bronchitis due to other specified organisms: Secondary | ICD-10-CM | POA: Diagnosis not present

## 2016-06-02 DIAGNOSIS — H353211 Exudative age-related macular degeneration, right eye, with active choroidal neovascularization: Secondary | ICD-10-CM | POA: Diagnosis not present

## 2016-06-02 DIAGNOSIS — H353122 Nonexudative age-related macular degeneration, left eye, intermediate dry stage: Secondary | ICD-10-CM | POA: Diagnosis not present

## 2016-06-25 DIAGNOSIS — H353211 Exudative age-related macular degeneration, right eye, with active choroidal neovascularization: Secondary | ICD-10-CM | POA: Diagnosis not present

## 2016-07-28 DIAGNOSIS — D0439 Carcinoma in situ of skin of other parts of face: Secondary | ICD-10-CM | POA: Diagnosis not present

## 2016-07-30 DIAGNOSIS — H353122 Nonexudative age-related macular degeneration, left eye, intermediate dry stage: Secondary | ICD-10-CM | POA: Diagnosis not present

## 2016-07-30 DIAGNOSIS — H353211 Exudative age-related macular degeneration, right eye, with active choroidal neovascularization: Secondary | ICD-10-CM | POA: Diagnosis not present

## 2016-08-26 DIAGNOSIS — G608 Other hereditary and idiopathic neuropathies: Secondary | ICD-10-CM | POA: Diagnosis not present

## 2016-08-26 DIAGNOSIS — G5791 Unspecified mononeuropathy of right lower limb: Secondary | ICD-10-CM | POA: Diagnosis not present

## 2016-09-10 ENCOUNTER — Other Ambulatory Visit: Payer: Self-pay | Admitting: Neurology

## 2016-09-10 DIAGNOSIS — G5791 Unspecified mononeuropathy of right lower limb: Secondary | ICD-10-CM

## 2016-09-22 ENCOUNTER — Ambulatory Visit
Admission: RE | Admit: 2016-09-22 | Discharge: 2016-09-22 | Disposition: A | Discharge status: Home / Self Care | Payer: PPO | Source: Ambulatory Visit | Attending: Neurology | Admitting: Neurology

## 2016-09-22 DIAGNOSIS — M5126 Other intervertebral disc displacement, lumbar region: Secondary | ICD-10-CM | POA: Insufficient documentation

## 2016-09-22 DIAGNOSIS — M5136 Other intervertebral disc degeneration, lumbar region: Secondary | ICD-10-CM | POA: Insufficient documentation

## 2016-09-22 DIAGNOSIS — G5791 Unspecified mononeuropathy of right lower limb: Secondary | ICD-10-CM | POA: Insufficient documentation

## 2016-09-23 ENCOUNTER — Emergency Department
Admission: EM | Admit: 2016-09-23 | Discharge: 2016-09-23 | Disposition: A | Discharge status: Home / Self Care | Payer: PPO | Attending: Emergency Medicine | Admitting: Emergency Medicine

## 2016-09-23 ENCOUNTER — Encounter: Payer: Self-pay | Admitting: Emergency Medicine

## 2016-09-23 ENCOUNTER — Emergency Department: Payer: PPO

## 2016-09-23 DIAGNOSIS — Z87891 Personal history of nicotine dependence: Secondary | ICD-10-CM | POA: Diagnosis not present

## 2016-09-23 DIAGNOSIS — I10 Essential (primary) hypertension: Secondary | ICD-10-CM | POA: Diagnosis not present

## 2016-09-23 DIAGNOSIS — Z79899 Other long term (current) drug therapy: Secondary | ICD-10-CM | POA: Insufficient documentation

## 2016-09-23 DIAGNOSIS — N201 Calculus of ureter: Secondary | ICD-10-CM | POA: Diagnosis not present

## 2016-09-23 DIAGNOSIS — N132 Hydronephrosis with renal and ureteral calculous obstruction: Secondary | ICD-10-CM | POA: Diagnosis not present

## 2016-09-23 DIAGNOSIS — R10A0 Flank pain, unspecified side: Secondary | ICD-10-CM

## 2016-09-23 DIAGNOSIS — R109 Unspecified abdominal pain: Secondary | ICD-10-CM

## 2016-09-23 DIAGNOSIS — N200 Calculus of kidney: Secondary | ICD-10-CM

## 2016-09-23 LAB — CBC
HEMATOCRIT: 49.8 % (ref 40.0–52.0)
Hemoglobin: 16.8 g/dL (ref 13.0–18.0)
MCH: 29.9 pg (ref 26.0–34.0)
MCHC: 33.8 g/dL (ref 32.0–36.0)
MCV: 88.7 fL (ref 80.0–100.0)
Platelets: 239 10*3/uL (ref 150–440)
RBC: 5.61 MIL/uL (ref 4.40–5.90)
RDW: 14.3 % (ref 11.5–14.5)
WBC: 9.1 10*3/uL (ref 3.8–10.6)

## 2016-09-23 LAB — URINALYSIS, COMPLETE (UACMP) WITH MICROSCOPIC
BACTERIA UA: NONE SEEN
Bilirubin Urine: NEGATIVE
Glucose, UA: NEGATIVE mg/dL
Hgb urine dipstick: NEGATIVE
KETONES UR: 5 mg/dL — AB
Nitrite: NEGATIVE
PROTEIN: 30 mg/dL — AB
Specific Gravity, Urine: 1.02 (ref 1.005–1.030)
pH: 6 (ref 5.0–8.0)

## 2016-09-23 LAB — COMPREHENSIVE METABOLIC PANEL
ALBUMIN: 4 g/dL (ref 3.5–5.0)
ALK PHOS: 63 U/L (ref 38–126)
ALT: 14 U/L — ABNORMAL LOW (ref 17–63)
ANION GAP: 15 (ref 5–15)
AST: 34 U/L (ref 15–41)
BUN: 21 mg/dL — AB (ref 6–20)
CO2: 16 mmol/L — AB (ref 22–32)
Calcium: 8.5 mg/dL — ABNORMAL LOW (ref 8.9–10.3)
Chloride: 110 mmol/L (ref 101–111)
Creatinine, Ser: 1.15 mg/dL (ref 0.61–1.24)
GFR calc Af Amer: >60 mL/min (ref >60)
GFR calc non Af Amer: 58 mL/min — ABNORMAL LOW (ref >60)
GLUCOSE: 123 mg/dL — AB (ref 65–99)
POTASSIUM: 3.3 mmol/L — AB (ref 3.5–5.1)
SODIUM: 141 mmol/L (ref 135–145)
Total Bilirubin: 0.6 mg/dL (ref 0.3–1.2)
Total Protein: 6.2 g/dL — ABNORMAL LOW (ref 6.5–8.1)

## 2016-09-23 MED ORDER — ONDANSETRON HCL 4 MG/2ML IJ SOLN
4.0000 mg | Freq: Once | INTRAMUSCULAR | Status: AC
  Administered 2016-09-23: 4 mg via INTRAVENOUS

## 2016-09-23 MED ORDER — IBUPROFEN 800 MG PO TABS: 800.0000 mg | ORAL_TABLET | Freq: Three times a day (TID) | ORAL | 0 refills | Status: DC | PRN

## 2016-09-23 MED ORDER — ONDANSETRON HCL 4 MG PO TABS: 4.0000 mg | ORAL_TABLET | Freq: Three times a day (TID) | ORAL | 0 refills | Status: DC | PRN

## 2016-09-23 MED ORDER — TAMSULOSIN HCL 0.4 MG PO CAPS: 0.4000 mg | ORAL_CAPSULE | Freq: Every day | ORAL | 0 refills | Status: AC

## 2016-09-23 MED ORDER — HYDROMORPHONE HCL 2 MG PO TABS: 2.0000 mg | ORAL_TABLET | Freq: Two times a day (BID) | ORAL | 0 refills | Status: DC | PRN

## 2016-09-23 MED ORDER — ONDANSETRON HCL 4 MG/2ML IJ SOLN
INTRAMUSCULAR | Status: AC
  Filled 2016-09-23: qty 2

## 2016-09-23 MED ORDER — KETOROLAC TROMETHAMINE 30 MG/ML IJ SOLN: 15.0000 mg | Freq: Once | INTRAMUSCULAR | Status: DC

## 2016-09-23 MED ORDER — KETOROLAC TROMETHAMINE 60 MG/2ML IM SOLN
INTRAMUSCULAR | Status: AC
  Administered 2016-09-23: 15 mg
  Filled 2016-09-23: qty 2

## 2016-09-23 NOTE — ED Triage Notes (Signed)
Pt with left side flank pain started this am with hx of kidney stones

## 2016-09-23 NOTE — ED Notes (Signed)
Pt states he came in feeling like he had another kidney stone. Was given toradol in triage and now states 0/10 pain. Urine sample sent to lab

## 2016-09-23 NOTE — ED Provider Notes (Signed)
Time Seen: Approximately 1306  I have reviewed the triage notes  Chief Complaint: Flank Pain   History of Present Illness: Jack [name] is a 83 y.o. male who presents with acute onset of left-sided flank pain that started suddenly at 9:30 AM. Patient states he has had a history of kidney stones and felt like similar pain in that he could not get comfortable. It sounds as though his past most of his kidney stones on his own without any intervention. Any fever or hematuria. He denies any testicular or mid abdominal pain.   Past Medical History:  Diagnosis Date  . Elevated prostate specific antigen (PSA)    has been 7 for a year   . History of colon polyps 2008   [facility],   . Hyperlipidemia   . Hypertension   . Kidney stones   . Prostate hypertrophy    diagnosed at age 89 due to hematospermia    Patient Active Problem List   Diagnosis Date Noted  . Multinodular goiter 07/03/2014  . Thyroid nodule 06/20/2014  . Edema 09/24/2013  . Encounter for preventive health examination 08/21/2013  . Personal history of colonic polyps 08/21/2013  . History of venomous snake bite 08/21/2013  . Cough 10/02/2012  . Prostate hypertrophy   . Elevated prostate specific antigen (PSA)   . HYPERTRIGLYCERIDEMIA 12/21/2006  . HYPERTENSION 12/21/2006  . GERD 12/21/2006  . CALCULUS, KIDNEY 12/21/2006    Past Surgical History:  Procedure Laterality Date  . RESECTION SOFT TISSUE TUMOR LEG / ANKLE RADICAL  jan 2009   [facility],  right thigh/knee , nonmalignant  . SMALL INTESTINE SURGERY  1946   implaed on picket fence, punctured stomach    Past Surgical History:  Procedure Laterality Date  . RESECTION SOFT TISSUE TUMOR LEG / ANKLE RADICAL  jan 2009   [facility],  right thigh/knee , nonmalignant  . SMALL INTESTINE SURGERY  1946   implaed on picket fence, punctured stomach    Current Outpatient Rx  . Order #: [id number] Class: Historical Med  . Order #: [id number] Class: Historical Med  .  Order #: [id number] Class: Historical Med  . Order #: [id number] Class: Normal  . Order #: [id number] Class: Historical Med  . Order #: [id number] Class: Historical Med  . Order #: [id number] Class: Historical Med  . Order #: [id number] Class: Historical Med  . Order #: [id number] Class: Historical Med  . Order #: [id number] Class: Historical Med  . Order #: [id number] Class: Historical Med  . Order #: [id number] Class: Historical Med  . Order #: [id number] Class: Historical Med    Allergies:  Amlodipine; Azithromycin; and Lisinopril  Family History: Family History  Problem Relation Age of Onset  . Hypertension Father   . Hyperlipidemia Father   . Cancer Sister     thyroid - dx in late 20's    Social History: Social History  Substance Use Topics  . Smoking status: Former Smoker    Quit date: 07/05/1965  . Smokeless tobacco: Never Used  . Alcohol use 0.6 oz/week    1 Standard drinks or equivalent per week     Comment: occassionaly     Review of Systems:   10 point review of systems was performed and was otherwise negative:  Constitutional: No fever Eyes: No visual disturbances ENT: No sore throat, ear pain Cardiac: No chest pain Respiratory: No shortness of breath, wheezing, or stridor Abdomen: Pain strictly in the left flank area Endocrine: No weight loss, No night sweats Extremities: No peripheral edema, cyanosis Skin: No rashes, easy bruising  Neurologic: No focal weakness, trouble with speech or swollowing Urologic: No dysuria, Hematuria, or urinary frequency Patient describes some urinary urgency  Physical Exam:  ED Triage Vitals  Enc Vitals Group     BP --      Pulse --      Resp --      Temp --      Temp src --      SpO2 --      Weight 09/23/16 1120 200 lb (90.7 kg)     Height 09/23/16 1120 5\' 9"  (1.753 m)     Head Circumference --      Peak Flow --      Pain Score 09/23/16 1121 10     Pain Loc --      Pain Edu? --      Excl. in [place]? --     General: Awake ,  Alert , and Oriented times 3; GCS 15 Head: Normal cephalic , atraumatic Eyes: Pupils equal , round, reactive to light Nose/Throat: No nasal drainage, patent upper airway without erythema or exudate.  Neck: Supple, Full range of motion, No anterior adenopathy or palpable thyroid masses Lungs: Clear to ascultation without wheezes , rhonchi, or rales Heart: Regular rate, regular rhythm without murmurs , gallops , or rubs Abdomen: Soft, non tender without rebound, guarding , or rigidity; bowel sounds positive and symmetric in all 4 quadrants. No organomegaly .        Extremities: 2 plus symmetric pulses. No edema, clubbing or cyanosis Neurologic: normal ambulation, Motor symmetric without deficits, sensory intact Skin: warm, dry, no rashes   Labs:   All laboratory work was reviewed including any pertinent negatives or positives listed below:  Labs Reviewed  COMPREHENSIVE METABOLIC PANEL - Abnormal; Notable for the following:       Result Value   Potassium 3.3 (*)    CO2 16 (*)    Glucose, Bld 123 (*)    BUN 21 (*)    Calcium 8.5 (*)    Total Protein 6.2 (*)    ALT 14 (*)    GFR calc non Af Amer 58 (*)    All other components within normal limits  CBC  URINALYSIS, COMPLETE (UACMP) WITH MICROSCOPIC    Radiology:  "Mr Lumbar Spine Wo Contrast  Result Date: 09/22/2016 CLINICAL DATA:  Mono neuropathy right lower extremity.  Numbness EXAM: MRI LUMBAR SPINE WITHOUT CONTRAST TECHNIQUE: Multiplanar, multisequence MR imaging of the lumbar spine was performed. No intravenous contrast was administered. COMPARISON:  None. FINDINGS: Segmentation:  Normal Alignment:  Slight anterior slip L3-4 and  L4-5 Vertebrae: Hemangioma L4 vertebral body. Negative for fracture or mass. Conus medullaris: Extends to the L1-2 level and appears normal. Paraspinal and other soft tissues: Paraspinous muscles symmetric and normal. No retroperitoneal mass or adenopathy. Disc levels: L1-2:  Negative L2-3: Small  extraforaminal disc protrusion on the right which could irritate the right L2 nerve root. No significant foraminal encroachment. No spinal stenosis. L3-4: Mild disc bulging and mild facet degeneration no significant spinal or foraminal stenosis. L4-5: Mild disc bulging and mild facet degeneration no significant spinal or foraminal stenosis L5-S1:  Negative IMPRESSION: Small extraforaminal disc protrusion on the right at L2-3 Mild disc and facet degeneration at L3-4 L4-5 without significant stenosis. Electronically Signed   By: Franchot Gallo M.D.   On: 09/22/2016 15:20   Ct Renal Stone Study  Result Date: 09/23/2016 CLINICAL DATA:  83 year old male with left flank pain beginning this morning. Initial encounter.  EXAM: CT ABDOMEN AND PELVIS WITHOUT CONTRAST TECHNIQUE: Multidetector CT imaging of the abdomen and pelvis was performed following the standard protocol without IV contrast. COMPARISON:  Lumbar MRI 09/22/2016. FINDINGS: Lower chest: No pericardial or pleural effusion. Calcified coronary artery atherosclerosis. Negative lung bases aside from mild scarring or atelectasis. Small sliding-type gastric hiatal hernia. Hepatobiliary: Negative noncontrast liver and gallbladder. No biliary ductal enlargement. Pancreas: Negative. Spleen: Negative. Adrenals/Urinary Tract: Minimal left adrenal nodularity appears benign. The right adrenal is normal. Normal noncontrast right kidney aside from 4-5 mm nonobstructing lower pole calculus. Although there is no right hydroureter or right periureteral stranding there is a distal right ureteral calculus measuring 4-5 mm (coronal image 98 and series 2, image 80). This is located about 2 cm proximal to the right ureterovesical junction which is normal. The urinary bladder is diminutive and unremarkable. On the left there is hydronephrosis, perinephric stranding, left hydroureter and periureteral stranding. No left intrarenal calculus. The left ureter remains abnormal to the level  of a a an oval 5-6 mm calculus which is located 10 mm proximal to the left ureterovesical junction (coronal image 102 and series 2, image 82). Stomach/Bowel: Decompressed rectum and sigmoid colon are negative. Decompressed left colon and distal transverse colon are negative. Occasional diverticula at the hepatic flexure which is otherwise normal. Mild to moderate diverticulosis of the ascending colon. Negative cecum and appendix. Negative terminal ileum. No dilated small bowel. Negative stomach aside from the small hiatal hernia. Negative duodenum. Vascular/Lymphatic: Aortoiliac calcified atherosclerosis noted. Vascular patency is not evaluated in the absence of IV contrast. Reproductive: Prostate hypertrophy up to 80 mm diameter. Dystrophic prostate calcifications. Other: No pelvic free fluid. Sequelae of left inguinal region hernia repair with mesh. Musculoskeletal: Osteopenia. Lower lumbar facet and mild disc degeneration. No acute osseous abnormality identified. IMPRESSION: 1. Bilateral distal ureteral calculi, although there are only secondary changes of acute obstructive uropathy on the left. A 4-5 mm distal right ureteral calculus is located about 2 cm proximal to the right UVJ, while a 5-6 mm distal left ureteral calculus is about 1 cm proximal to the left UVJ. 2. No intrarenal calculus on the left. There is a 4-5 mm right lower pole renal calculus. 3. Moderate to severe prostate enlargement (8 cm diameter). 4. Calcified aortic atherosclerosis. Previous left inguinal hernia repair. Mild right colon diverticulosis. Electronically Signed   By: Genevie Ann M.D.   On: 09/23/2016 13:33  "   I personally reviewed the radiologic studies     ED Course:  The patient received Toradol in the triage area and actually had symptomatic relief by the time he was seen in the main emergency department. He appears to have an obstructing stone on the left. Patient denies any nausea or vomiting at this time and I felt was  stable for discharge. He's requested a local urologist for further evaluation of his kidney stone. Clinical Course      Assessment:  Acute renal colic   Final Clinical Impression:   Final diagnoses:  Flank pain     Plan:  Outpatient " Discharge Medication List as of 09/23/2016  2:11 PM    START taking these medications   Details  HYDROmorphone (DILAUDID) 2 MG tablet Take 1 tablet (2 mg total) by mouth every 12 (twelve) hours as needed for severe pain., Starting Thu 09/23/2016, Print    ibuprofen (ADVIL,MOTRIN) 800 MG tablet Take 1 tablet (800 mg total) by mouth every 8 (eight) hours as needed., Starting Thu 09/23/2016, Print  ondansetron (ZOFRAN) 4 MG tablet Take 1 tablet (4 mg total) by mouth every 8 (eight) hours as needed for nausea or vomiting., Starting Thu 09/23/2016, Print    tamsulosin (FLOMAX) 0.4 MG CAPS capsule Take 1 capsule (0.4 mg total) by mouth daily., Starting Thu 09/23/2016, Until Thu 09/30/2016, Print      " Patient was advised to return immediately if condition worsens. Patient was advised to follow up with their primary care physician or other specialized physicians involved in their outpatient care. The patient and/or family member/power of attorney had laboratory results reviewed at the bedside. All questions and concerns were addressed and appropriate discharge instructions were distributed by the nursing staff.             Daymon Larsen, MD 09/23/16 [phone]

## 2016-09-23 NOTE — Discharge Instructions (Signed)
Please return immediately if condition worsens. Please contact her primary physician or the physician you were given for referral. If you have any specialist physicians involved in her treatment and plan please also contact them. Thank you for using [facility].  Please strain urine and save the stone if one becomes available. Please call the urologist office tomorrow to schedule follow-up for next week especially still symptomatic

## 2016-09-24 DIAGNOSIS — H353211 Exudative age-related macular degeneration, right eye, with active choroidal neovascularization: Secondary | ICD-10-CM | POA: Diagnosis not present

## 2016-09-27 DIAGNOSIS — N201 Calculus of ureter: Secondary | ICD-10-CM | POA: Diagnosis not present

## 2016-11-19 DIAGNOSIS — H353211 Exudative age-related macular degeneration, right eye, with active choroidal neovascularization: Secondary | ICD-10-CM | POA: Diagnosis not present

## 2016-11-26 DIAGNOSIS — G608 Other hereditary and idiopathic neuropathies: Secondary | ICD-10-CM | POA: Diagnosis not present

## 2016-12-17 DIAGNOSIS — I10 Essential (primary) hypertension: Secondary | ICD-10-CM | POA: Diagnosis not present

## 2016-12-17 DIAGNOSIS — Z87898 Personal history of other specified conditions: Secondary | ICD-10-CM | POA: Diagnosis not present

## 2017-01-24 DIAGNOSIS — H353211 Exudative age-related macular degeneration, right eye, with active choroidal neovascularization: Secondary | ICD-10-CM | POA: Diagnosis not present

## 2017-04-18 DIAGNOSIS — H353211 Exudative age-related macular degeneration, right eye, with active choroidal neovascularization: Secondary | ICD-10-CM | POA: Diagnosis not present

## 2017-04-18 DIAGNOSIS — H353122 Nonexudative age-related macular degeneration, left eye, intermediate dry stage: Secondary | ICD-10-CM | POA: Diagnosis not present

## 2017-05-06 NOTE — Telephone Encounter (Signed)
error 

## 2017-06-08 NOTE — Telephone Encounter (Signed)
Error

## 2017-06-08 NOTE — Telephone Encounter (Signed)
Referral

## 2017-06-27 DIAGNOSIS — H353211 Exudative age-related macular degeneration, right eye, with active choroidal neovascularization: Secondary | ICD-10-CM | POA: Diagnosis not present

## 2017-06-28 DIAGNOSIS — H353211 Exudative age-related macular degeneration, right eye, with active choroidal neovascularization: Secondary | ICD-10-CM | POA: Diagnosis not present

## 2017-08-12 DIAGNOSIS — D225 Melanocytic nevi of trunk: Secondary | ICD-10-CM | POA: Diagnosis not present

## 2017-08-12 DIAGNOSIS — R972 Elevated prostate specific antigen [PSA]: Secondary | ICD-10-CM | POA: Diagnosis not present

## 2017-08-12 DIAGNOSIS — I10 Essential (primary) hypertension: Secondary | ICD-10-CM | POA: Diagnosis not present

## 2017-08-12 DIAGNOSIS — Z0000 Encounter for general adult medical examination without abnormal findings: Secondary | ICD-10-CM | POA: Diagnosis not present

## 2017-08-12 DIAGNOSIS — L821 Other seborrheic keratosis: Secondary | ICD-10-CM | POA: Diagnosis not present

## 2017-08-12 DIAGNOSIS — D2262 Melanocytic nevi of left upper limb, including shoulder: Secondary | ICD-10-CM | POA: Diagnosis not present

## 2017-08-12 DIAGNOSIS — Z85828 Personal history of other malignant neoplasm of skin: Secondary | ICD-10-CM | POA: Diagnosis not present

## 2017-08-12 DIAGNOSIS — N400 Enlarged prostate without lower urinary tract symptoms: Secondary | ICD-10-CM | POA: Diagnosis not present

## 2017-08-30 DIAGNOSIS — H353211 Exudative age-related macular degeneration, right eye, with active choroidal neovascularization: Secondary | ICD-10-CM | POA: Diagnosis not present

## 2017-09-16 DIAGNOSIS — J069 Acute upper respiratory infection, unspecified: Secondary | ICD-10-CM | POA: Diagnosis not present

## 2017-09-27 DIAGNOSIS — N134 Hydroureter: Secondary | ICD-10-CM | POA: Diagnosis not present

## 2017-09-27 DIAGNOSIS — M545 Low back pain: Secondary | ICD-10-CM | POA: Diagnosis not present

## 2017-09-27 DIAGNOSIS — N132 Hydronephrosis with renal and ureteral calculous obstruction: Secondary | ICD-10-CM | POA: Diagnosis not present

## 2017-09-27 DIAGNOSIS — N23 Unspecified renal colic: Secondary | ICD-10-CM | POA: Diagnosis not present

## 2017-09-27 DIAGNOSIS — Z87891 Personal history of nicotine dependence: Secondary | ICD-10-CM | POA: Diagnosis not present

## 2017-09-30 DIAGNOSIS — R109 Unspecified abdominal pain: Secondary | ICD-10-CM | POA: Diagnosis not present

## 2017-09-30 DIAGNOSIS — N132 Hydronephrosis with renal and ureteral calculous obstruction: Secondary | ICD-10-CM | POA: Diagnosis not present

## 2017-09-30 DIAGNOSIS — I10 Essential (primary) hypertension: Secondary | ICD-10-CM | POA: Diagnosis not present

## 2017-09-30 DIAGNOSIS — Z87891 Personal history of nicotine dependence: Secondary | ICD-10-CM | POA: Diagnosis not present

## 2017-09-30 DIAGNOSIS — N200 Calculus of kidney: Secondary | ICD-10-CM | POA: Diagnosis not present

## 2017-10-11 DIAGNOSIS — N202 Calculus of kidney with calculus of ureter: Secondary | ICD-10-CM | POA: Diagnosis not present

## 2017-10-14 DIAGNOSIS — Z711 Person with feared health complaint in whom no diagnosis is made: Secondary | ICD-10-CM | POA: Diagnosis not present

## 2017-10-14 DIAGNOSIS — N201 Calculus of ureter: Secondary | ICD-10-CM | POA: Diagnosis not present

## 2017-10-15 ENCOUNTER — Other Ambulatory Visit: Payer: Self-pay

## 2017-10-15 DIAGNOSIS — T83192A Other mechanical complication of urinary stent, initial encounter: Secondary | ICD-10-CM | POA: Diagnosis not present

## 2017-10-15 DIAGNOSIS — E785 Hyperlipidemia, unspecified: Secondary | ICD-10-CM | POA: Diagnosis not present

## 2017-10-15 DIAGNOSIS — Z87442 Personal history of urinary calculi: Secondary | ICD-10-CM | POA: Diagnosis not present

## 2017-10-15 DIAGNOSIS — N400 Enlarged prostate without lower urinary tract symptoms: Secondary | ICD-10-CM | POA: Insufficient documentation

## 2017-10-15 DIAGNOSIS — N132 Hydronephrosis with renal and ureteral calculous obstruction: Secondary | ICD-10-CM | POA: Diagnosis not present

## 2017-10-15 DIAGNOSIS — N179 Acute kidney failure, unspecified: Secondary | ICD-10-CM | POA: Insufficient documentation

## 2017-10-15 DIAGNOSIS — Y838 Other surgical procedures as the cause of abnormal reaction of the patient, or of later complication, without mention of misadventure at the time of the procedure: Secondary | ICD-10-CM | POA: Insufficient documentation

## 2017-10-15 DIAGNOSIS — Z79899 Other long term (current) drug therapy: Secondary | ICD-10-CM | POA: Diagnosis not present

## 2017-10-15 DIAGNOSIS — Z87891 Personal history of nicotine dependence: Secondary | ICD-10-CM | POA: Diagnosis not present

## 2017-10-15 DIAGNOSIS — R1011 Right upper quadrant pain: Secondary | ICD-10-CM | POA: Diagnosis not present

## 2017-10-15 DIAGNOSIS — I10 Essential (primary) hypertension: Secondary | ICD-10-CM | POA: Diagnosis not present

## 2017-10-15 DIAGNOSIS — S3710XA Unspecified injury of ureter, initial encounter: Secondary | ICD-10-CM | POA: Diagnosis not present

## 2017-10-15 DIAGNOSIS — N200 Calculus of kidney: Secondary | ICD-10-CM | POA: Diagnosis not present

## 2017-10-15 DIAGNOSIS — S3719XA Other injury of ureter, initial encounter: Secondary | ICD-10-CM | POA: Diagnosis not present

## 2017-10-15 DIAGNOSIS — R109 Unspecified abdominal pain: Secondary | ICD-10-CM | POA: Diagnosis not present

## 2017-10-15 DIAGNOSIS — K219 Gastro-esophageal reflux disease without esophagitis: Secondary | ICD-10-CM | POA: Insufficient documentation

## 2017-10-15 DIAGNOSIS — K668 Other specified disorders of peritoneum: Secondary | ICD-10-CM | POA: Diagnosis not present

## 2017-10-15 DIAGNOSIS — N9989 Other postprocedural complications and disorders of genitourinary system: Secondary | ICD-10-CM | POA: Diagnosis not present

## 2017-10-15 DIAGNOSIS — N1330 Unspecified hydronephrosis: Secondary | ICD-10-CM | POA: Diagnosis not present

## 2017-10-15 MED ORDER — HYDROMORPHONE HCL 1 MG/ML IJ SOLN
1.0000 mg | Freq: Once | INTRAMUSCULAR | Status: AC
  Administered 2017-10-15: 1 mg via INTRAMUSCULAR
  Filled 2017-10-15: qty 1

## 2017-10-15 NOTE — ED Triage Notes (Signed)
Reports having back pain after having stents placed for kidney stones yesterday.  Report prescribed medications are not working.

## 2017-10-15 NOTE — ED Notes (Signed)
Patient instructed to remain in wheelchair due to effects of medications.

## 2017-10-16 ENCOUNTER — Other Ambulatory Visit: Payer: Self-pay

## 2017-10-16 ENCOUNTER — Inpatient Hospital Stay: Payer: PPO | Admitting: Anesthesiology

## 2017-10-16 ENCOUNTER — Emergency Department: Payer: PPO

## 2017-10-16 ENCOUNTER — Encounter
Admission: EM | Disposition: A | Discharge status: Home / Self Care | Payer: Self-pay | Source: Home / Self Care | Attending: Emergency Medicine

## 2017-10-16 ENCOUNTER — Encounter: Payer: Self-pay | Admitting: Internal Medicine

## 2017-10-16 ENCOUNTER — Observation Stay
Admission: EM | Admit: 2017-10-16 | Discharge: 2017-10-16 | Disposition: A | Discharge status: Home / Self Care | Payer: PPO | Attending: Internal Medicine | Admitting: Internal Medicine

## 2017-10-16 DIAGNOSIS — I10 Essential (primary) hypertension: Secondary | ICD-10-CM | POA: Diagnosis not present

## 2017-10-16 DIAGNOSIS — N9971 Accidental puncture and laceration of a genitourinary system organ or structure during a genitourinary system procedure: Secondary | ICD-10-CM

## 2017-10-16 DIAGNOSIS — K219 Gastro-esophageal reflux disease without esophagitis: Secondary | ICD-10-CM | POA: Diagnosis not present

## 2017-10-16 DIAGNOSIS — T83192A Other mechanical complication of urinary stent, initial encounter: Secondary | ICD-10-CM | POA: Diagnosis not present

## 2017-10-16 DIAGNOSIS — N200 Calculus of kidney: Secondary | ICD-10-CM

## 2017-10-16 DIAGNOSIS — E785 Hyperlipidemia, unspecified: Secondary | ICD-10-CM | POA: Diagnosis not present

## 2017-10-16 DIAGNOSIS — N9972 Accidental puncture and laceration of a genitourinary system organ or structure during other procedure: Secondary | ICD-10-CM

## 2017-10-16 DIAGNOSIS — S3710XA Unspecified injury of ureter, initial encounter: Secondary | ICD-10-CM

## 2017-10-16 DIAGNOSIS — N1330 Unspecified hydronephrosis: Secondary | ICD-10-CM | POA: Diagnosis not present

## 2017-10-16 DIAGNOSIS — R109 Unspecified abdominal pain: Secondary | ICD-10-CM | POA: Diagnosis not present

## 2017-10-16 DIAGNOSIS — K668 Other specified disorders of peritoneum: Secondary | ICD-10-CM

## 2017-10-16 DIAGNOSIS — R1011 Right upper quadrant pain: Secondary | ICD-10-CM

## 2017-10-16 DIAGNOSIS — N9989 Other postprocedural complications and disorders of genitourinary system: Secondary | ICD-10-CM | POA: Diagnosis not present

## 2017-10-16 DIAGNOSIS — N2889 Other specified disorders of kidney and ureter: Secondary | ICD-10-CM | POA: Diagnosis not present

## 2017-10-16 HISTORY — PX: CYSTOSCOPY W/ URETERAL STENT PLACEMENT: SHX1429

## 2017-10-16 LAB — CBC WITH DIFFERENTIAL/PLATELET
BASOS ABS: 0.1 10*3/uL (ref 0–0.1)
BASOS PCT: 1 %
EOS PCT: 0 %
Eosinophils Absolute: 0 10*3/uL (ref 0–0.7)
HCT: 45.8 % (ref 40.0–52.0)
Hemoglobin: 15.5 g/dL (ref 13.0–18.0)
Lymphocytes Relative: 7 %
Lymphs Abs: 1.1 10*3/uL (ref 1.0–3.6)
MCH: 30.3 pg (ref 26.0–34.0)
MCHC: 33.9 g/dL (ref 32.0–36.0)
MCV: 89.4 fL (ref 80.0–100.0)
MONO ABS: 0.8 10*3/uL (ref 0.2–1.0)
Monocytes Relative: 5 %
Neutro Abs: 14.1 10*3/uL — ABNORMAL HIGH (ref 1.4–6.5)
Neutrophils Relative %: 87 %
PLATELETS: 224 10*3/uL (ref 150–440)
RBC: 5.12 MIL/uL (ref 4.40–5.90)
RDW: 13.6 % (ref 11.5–14.5)
WBC: 16 10*3/uL — ABNORMAL HIGH (ref 3.8–10.6)

## 2017-10-16 LAB — BASIC METABOLIC PANEL
ANION GAP: 13 (ref 5–15)
BUN: 38 mg/dL — ABNORMAL HIGH (ref 6–20)
CALCIUM: 9.5 mg/dL (ref 8.9–10.3)
CO2: 22 mmol/L (ref 22–32)
CREATININE: 2.28 mg/dL — AB (ref 0.61–1.24)
Chloride: 100 mmol/L — ABNORMAL LOW (ref 101–111)
GFR calc Af Amer: 29 mL/min — ABNORMAL LOW (ref >60)
GFR, EST NON AFRICAN AMERICAN: 25 mL/min — AB (ref >60)
GLUCOSE: 157 mg/dL — AB (ref 65–99)
Potassium: 3.9 mmol/L (ref 3.5–5.1)
Sodium: 135 mmol/L (ref 135–145)

## 2017-10-16 LAB — URINALYSIS, COMPLETE (UACMP) WITH MICROSCOPIC
SPECIFIC GRAVITY, URINE: 1.02 (ref 1.005–1.030)
SQUAMOUS EPITHELIAL / LPF: NONE SEEN

## 2017-10-16 SURGERY — CYSTOSCOPY, WITH RETROGRADE PYELOGRAM AND URETERAL STENT INSERTION
Anesthesia: General | Laterality: Right

## 2017-10-16 MED ORDER — CEFTRIAXONE SODIUM 2 G IJ SOLR
INTRAMUSCULAR | Status: AC
  Filled 2017-10-16: qty 2

## 2017-10-16 MED ORDER — FENTANYL CITRATE (PF) 100 MCG/2ML IJ SOLN
INTRAMUSCULAR | Status: DC | PRN
  Administered 2017-10-16 (×4): 25 ug via INTRAVENOUS

## 2017-10-16 MED ORDER — HYDROMORPHONE HCL 1 MG/ML IJ SOLN
1.0000 mg | Freq: Once | INTRAMUSCULAR | Status: AC
  Administered 2017-10-16: 1 mg via INTRAVENOUS
  Filled 2017-10-16: qty 1

## 2017-10-16 MED ORDER — ONDANSETRON HCL 4 MG/2ML IJ SOLN
4.0000 mg | Freq: Four times a day (QID) | INTRAMUSCULAR | Status: DC | PRN
Start: 2017-10-16 — End: 2017-10-16

## 2017-10-16 MED ORDER — PROPOFOL 10 MG/ML IV BOLUS
INTRAVENOUS | Status: AC
  Filled 2017-10-16: qty 20

## 2017-10-16 MED ORDER — FENTANYL CITRATE (PF) 100 MCG/2ML IJ SOLN
INTRAMUSCULAR | Status: AC
  Filled 2017-10-16: qty 2

## 2017-10-16 MED ORDER — LABETALOL HCL 5 MG/ML IV SOLN
10.0000 mg | INTRAVENOUS | Status: DC | PRN
  Administered 2017-10-16: 5 mg via INTRAVENOUS

## 2017-10-16 MED ORDER — MIDAZOLAM HCL 2 MG/2ML IJ SOLN
INTRAMUSCULAR | Status: DC | PRN
  Administered 2017-10-16: 1 mg via INTRAVENOUS

## 2017-10-16 MED ORDER — SODIUM CHLORIDE 0.9 % IV SOLN
INTRAVENOUS | Status: DC
  Administered 2017-10-16: 11:00:00 via INTRAVENOUS

## 2017-10-16 MED ORDER — LACTATED RINGERS IV SOLN
INTRAVENOUS | Status: DC | PRN
  Administered 2017-10-16: 08:00:00 via INTRAVENOUS

## 2017-10-16 MED ORDER — ACETAMINOPHEN 325 MG PO TABS: 650.0000 mg | ORAL_TABLET | Freq: Four times a day (QID) | ORAL | Status: DC | PRN

## 2017-10-16 MED ORDER — IOTHALAMATE MEGLUMINE 17.2 % UR SOLN
URETHRAL | Status: DC | PRN
  Administered 2017-10-16: 10 mL via URETHRAL

## 2017-10-16 MED ORDER — ACETAMINOPHEN 650 MG RE SUPP: 650.0000 mg | Freq: Four times a day (QID) | RECTAL | Status: DC | PRN

## 2017-10-16 MED ORDER — SODIUM CHLORIDE 0.9 % IV BOLUS (SEPSIS)
1000.0000 mL | Freq: Once | INTRAVENOUS | Status: AC
  Administered 2017-10-16: 1000 mL via INTRAVENOUS

## 2017-10-16 MED ORDER — HYDROCODONE-ACETAMINOPHEN 5-325 MG PO TABS: 1.0000 | ORAL_TABLET | ORAL | Status: DC | PRN

## 2017-10-16 MED ORDER — LABETALOL HCL 5 MG/ML IV SOLN
INTRAVENOUS | Status: AC
  Filled 2017-10-16: qty 4

## 2017-10-16 MED ORDER — HYDROMORPHONE HCL 1 MG/ML IJ SOLN: 1.0000 mg | INTRAMUSCULAR | Status: DC | PRN

## 2017-10-16 MED ORDER — OXYCODONE-ACETAMINOPHEN 5-325 MG PO TABS
1.0000 | ORAL_TABLET | Freq: Once | ORAL | Status: AC
  Administered 2017-10-16: 1 via ORAL

## 2017-10-16 MED ORDER — ONDANSETRON HCL 4 MG/2ML IJ SOLN
INTRAMUSCULAR | Status: DC | PRN
  Administered 2017-10-16: 4 mg via INTRAVENOUS

## 2017-10-16 MED ORDER — ONDANSETRON HCL 4 MG PO TABS: 4.0000 mg | ORAL_TABLET | Freq: Four times a day (QID) | ORAL | Status: DC | PRN

## 2017-10-16 MED ORDER — PROPOFOL 10 MG/ML IV BOLUS
INTRAVENOUS | Status: DC | PRN
  Administered 2017-10-16: 120 mg via INTRAVENOUS

## 2017-10-16 MED ORDER — OXYCODONE-ACETAMINOPHEN 5-325 MG PO TABS
ORAL_TABLET | ORAL | Status: AC
  Filled 2017-10-16: qty 1

## 2017-10-16 MED ORDER — TAMSULOSIN HCL 0.4 MG PO CAPS
0.4000 mg | ORAL_CAPSULE | Freq: Every day | ORAL | Status: DC
  Administered 2017-10-16: 0.4 mg via ORAL
  Filled 2017-10-16: qty 1

## 2017-10-16 MED ORDER — CEFUROXIME AXETIL 500 MG PO TABS: 500.0000 mg | ORAL_TABLET | Freq: Two times a day (BID) | ORAL | 0 refills | Status: AC

## 2017-10-16 MED ORDER — DEXAMETHASONE SODIUM PHOSPHATE 10 MG/ML IJ SOLN
INTRAMUSCULAR | Status: DC | PRN
  Administered 2017-10-16: 5 mg via INTRAVENOUS

## 2017-10-16 MED ORDER — OXYBUTYNIN CHLORIDE 5 MG PO TABS: 5.0000 mg | ORAL_TABLET | Freq: Three times a day (TID) | ORAL | Status: DC | PRN

## 2017-10-16 MED ORDER — SENNOSIDES-DOCUSATE SODIUM 8.6-50 MG PO TABS: 1.0000 | ORAL_TABLET | Freq: Every evening | ORAL | Status: DC | PRN

## 2017-10-16 MED ORDER — MIDAZOLAM HCL 2 MG/2ML IJ SOLN
INTRAMUSCULAR | Status: AC
  Filled 2017-10-16: qty 2

## 2017-10-16 MED ORDER — DEXTROSE 5 % IV SOLN
2.0000 g | INTRAVENOUS | Status: DC
  Administered 2017-10-16: 2 g via INTRAVENOUS
  Filled 2017-10-16: qty 2

## 2017-10-16 SURGICAL SUPPLY — 18 items
CATH URETL 5X70 OPEN END (CATHETERS) ×3 IMPLANT
CONRAY 43 FOR UROLOGY 50M (MISCELLANEOUS) ×3 IMPLANT
GLOVE BIOGEL M 8.0 STRL (GLOVE) ×7 IMPLANT
GOWN STANDARD XL  REUSABL (MISCELLANEOUS) ×3 IMPLANT
KIT TURNOVER CYSTO (KITS) ×3 IMPLANT
PACK CYSTO AR (MISCELLANEOUS) ×3 IMPLANT
SENSORWIRE 0.038 NOT ANGLED (WIRE) ×3
SET CYSTO W/LG BORE CLAMP LF (SET/KITS/TRAYS/PACK) ×3 IMPLANT
SOL .9 NS 3000ML IRR  AL (IV SOLUTION) ×2
SOL .9 NS 3000ML IRR AL (IV SOLUTION) ×1
SOL .9 NS 3000ML IRR UROMATIC (IV SOLUTION) IMPLANT
STENT URET 6FRX24 CONTOUR (STENTS) ×1 IMPLANT
STENT URET 6FRX26 CONTOUR (STENTS) ×3 IMPLANT
SURGILUBE 2OZ TUBE FLIPTOP (MISCELLANEOUS) ×3 IMPLANT
SYR 10ML LL (SYRINGE) ×3 IMPLANT
WATER STERILE IRR 1000ML POUR (IV SOLUTION) ×3 IMPLANT
WATER STERILE IRR 3000ML UROMA (IV SOLUTION) ×1 IMPLANT
WIRE SENSOR 0.038 NOT ANGLED (WIRE) ×1 IMPLANT

## 2017-10-16 NOTE — Progress Notes (Signed)
Urology Postop Note  No complaints, feels well.  Flank pain and incontinence have resolved. Afebrile, VSS  Impression: Doing well status post exchange of right ureteral stent.  Recommendation: Okay for discharge from GU standpoint.  He will continue his at home medications and will add an oral antibiotic.  Rx Ceftin 500 mg twice daily sent to his pharmacy. Discussed with Dr. Leslye Peer.

## 2017-10-16 NOTE — Op Note (Signed)
Preoperative diagnosis:  1. Migration right ureteral stent 2. Perforation right proximal ureter  Postoperative diagnosis:  1. Migration right ureteral stent 2. Perforation right proximal ureter 3. Migration left ureteral stent  Procedure: 1. Cystoscopy with bilateral stent removal 2. Right retrograde pyelogram 3. Placement right ureteral stent  Surgeon: Abbie Sons, MD  Anesthesia: General  Complications: None  Intraoperative findings:  Right retrograde pyelogram-mild right hydronephrosis with extravasation of contrast noted right proximal ureter   EBL: Minimal  Specimens: None  Indication: Jack [name] is a 84 y.o. patient status post bilateral ureteroscopy in [place] on 10/14/2017.  Per patient's history he had bilateral distal ureteral calculi however the calculi were not identified intraoperatively.  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  Bilateral ureteral stents were placed.  He presented to the [facility] ED last night complaining of right flank pain, urinary incontinence and hematuria.  A CT scan was remarkable for migration of his ureteral stent to the proximal ureter with the stent tip apparently outside the ureter.  He was noted to have perinephric fluid and extrarenal air.  After reviewing the management options for treatment, he elected to proceed with the above surgical procedure(s). We have discussed the potential benefits and risks of the procedure, side effects of the proposed treatment, the likelihood of the patient achieving the goals of the procedure, and any potential problems that might occur during the procedure or recuperation. Informed consent has been obtained.  Description of procedure:  The patient was taken to the operating room and general anesthesia was induced.  The patient was placed in the dorsal lithotomy position, prepped and draped in the usual sterile fashion, and preoperative antibiotics were  administered. A preoperative time-out was performed.   A 22 French cystoscope was lubricated and passed under direct vision.  The distal stent curls were identified in the bulbar urethra.  The cystoscope was advanced in the bladder.  There was moderate lateral lobe prostate enlargement.  The proximal stent ends were noted to be in the proximal ureters on fluoroscopy bilaterally.  Attention was directed to the right ureteral orifice.  A 6 French open-ended catheter was placed to the cystoscope in position at the right UO.  A 5.366 hydrophilic guidewire was placed through the catheter and into the right ureteral orifice.  The guidewire was advanced under fluoroscopic guidance into the right renal pelvis and was alongside the right ureteral stent.  The ureteral catheter was advanced over the wire through the renal pelvis.  Right retrograde pyelogram was performed with findings as described above.  The sensor wire was then placed in the ureteral catheter and the ureteral catheter was removed.  His right ureteral stent was removed via the string.  His stents were to be removed tomorrow morning and with migration of his left ureteral stent this was also removed.  A 6 French/26 cm double-J ureteral stent was then placed over the wire.  The proximal tip was within upper pole calyx and was withdrawn under fluoroscopic guidance to the renal pelvis where a good curl was noted.  The distal stent and was in good position with some extra length present in the bladder.  The bladder was emptied and the cystoscope was removed.  After anesthetic reversal he was transferred to the PACU in stable condition.    Abbie Sons, M.D.

## 2017-10-16 NOTE — Care Management CC44 (Signed)
Condition Code 44 Documentation Completed  Patient Details  Name: NAYEF COLLEGE MRN: [id number] Date of Birth: 16-Mar-1933   Condition Code 44 given:  Yes Patient signature on Condition Code 44 notice:  Yes Documentation of 2 MD's agreement:  Yes Code 44 added to claim:  Yes    Gianmarco Roye A, RN 10/16/2017, 1:29 PM

## 2017-10-16 NOTE — Plan of Care (Signed)
Vs stable. Pt tolerated lunch and ambulated well. Pt ready to leave

## 2017-10-16 NOTE — Progress Notes (Signed)
Jack [name] Decree to be D/C'd Home per MD order.  Discussed prescriptions and follow up appointments with the patient. Prescriptions given to patient, medication list explained in detail. Pt verbalized understanding. Pt tolerated meal and ambulated well without pain.    Vitals:   10/16/17 1002 10/16/17 1210  BP: 119/73 138/86  Pulse: 87 98  Resp: 16   Temp: 98 F (36.7 C)   SpO2: (!) 88%     Skin clean, dry and intact without evidence of skin break down, no evidence of skin tears noted. IV catheter discontinued intact. Site without signs and symptoms of complications. Dressing and pressure applied. Pt denies pain at this time. No complaints noted.  An After Visit Summary was printed and given to the patient. Patient escorted via [place], and D/C home via private auto.  [name]

## 2017-10-16 NOTE — Anesthesia Procedure Notes (Signed)
Procedure Name: LMA Insertion Date/Time: 10/16/2017 7:35 AM Performed by: Dionne Bucy, CRNA Pre-anesthesia Checklist: Patient identified, Patient being monitored, Timeout performed, Emergency Drugs available and Suction available Patient Re-evaluated:Patient Re-evaluated prior to induction Oxygen Delivery Method: Circle system utilized Preoxygenation: Pre-oxygenation with 100% oxygen Induction Type: IV induction Ventilation: Mask ventilation without difficulty LMA: LMA inserted LMA Size: 4.0 Tube type: Oral Number of attempts: 1 Placement Confirmation: positive ETCO2 and breath sounds checked- equal and bilateral Tube secured with: Tape Dental Injury: Teeth and Oropharynx as per pre-operative assessment

## 2017-10-16 NOTE — ED Provider Notes (Signed)
[facility] Emergency Department Provider Note  ____________________________________________   First MD Initiated Contact with Patient 10/16/17 0510     (approximate)  I have reviewed the triage vital signs and the nursing notes.   HISTORY  Chief Complaint Abdominal Pain    HPI Jack [name] is a 84 y.o. male who self presents to the emergency department with roughly 24 hours of severe right flank pain.  Yesterday he had bilateral stents placed for nephrolithiasis.  His pain has been severe throbbing aching.  Somewhat worse with movement somewhat improved with rest.  He denies fevers or chills.  He does report some nausea but no vomiting.  Past Medical History:  Diagnosis Date  . Elevated prostate specific antigen (PSA)    has been 7 for a year   . History of colon polyps 2008   [facility],   . Hyperlipidemia   . Hypertension   . Kidney stones   . Prostate hypertrophy    diagnosed at age 12 due to hematospermia    Patient Active Problem List   Diagnosis Date Noted  . Ureteral perforation secondary to stent manipulation 10/16/2017  . Abdominal pain 10/16/2017  . Multinodular goiter 07/03/2014  . Thyroid nodule 06/20/2014  . Edema 09/24/2013  . Encounter for preventive health examination 08/21/2013  . Personal history of colonic polyps 08/21/2013  . History of venomous snake bite 08/21/2013  . Cough 10/02/2012  . Prostate hypertrophy   . Elevated prostate specific antigen (PSA)   . HYPERTRIGLYCERIDEMIA 12/21/2006  . HYPERTENSION 12/21/2006  . GERD 12/21/2006  . CALCULUS, KIDNEY 12/21/2006    Past Surgical History:  Procedure Laterality Date  . CYSTOSCOPY W/ URETERAL STENT PLACEMENT Right 10/16/2017   Procedure: right  URETERAL STENT PLACEMENT,cystoscopy bilateral stent removal,rretrograde;  Surgeon: Abbie Sons, MD;  Location: [facility] ORS;  Service: Urology;  Laterality: Right;  . RESECTION SOFT TISSUE TUMOR LEG / ANKLE RADICAL   jan 2009   [facility],  right thigh/knee , nonmalignant  . SMALL INTESTINE SURGERY  1946   implaed on picket fence, punctured stomach    Prior to Admission medications   Medication Sig Start Date End Date Taking? Authorizing Provider  alfuzosin (UROXATRAL) 10 MG 24 hr tablet Take 10 mg by mouth daily with breakfast.   Yes [provider]  Ascorbic Acid (VITAMIN C) 1000 MG tablet Take 1,000 mg by mouth daily.   Yes [provider]  beta carotene 25000 UNIT capsule Take 25,000 Units by mouth daily.   Yes [provider]  cholecalciferol (VITAMIN D) 1000 UNITS tablet Take 1,000 Units by mouth daily.   Yes [provider]  HYDROcodone-acetaminophen (NORCO/VICODIN) 5-325 MG tablet Take 1 tablet by mouth every 6 (six) hours as needed. for pain 10/14/17  Yes [provider]  losartan (COZAAR) 25 MG tablet Take 12.5 mg by mouth daily.   Yes [provider]  Magnesium 250 MG TABS Take 2 tablets by mouth daily.   Yes [provider]  Misc Natural Products (OSTEO BI-FLEX ADV DOUBLE ST PO) Take 2 tablets by mouth daily.   Yes [provider]  Multiple Vitamins-Minerals (PRESERVISION AREDS 2 PO) Take 2 capsules by mouth daily.   Yes [provider]  Omega-3 Fatty Acids (FISH OIL) 1200 MG CAPS Take 2 capsules by mouth daily.   Yes [provider]  ondansetron (ZOFRAN) 4 MG tablet Take 1 tablet (4 mg total) by mouth every 8 (eight) hours as needed for nausea  or vomiting. 09/23/16  Yes Daymon Larsen, MD  oxybutynin (DITROPAN) 5 MG tablet Take 5 mg by mouth 3 (three) times daily.   Yes [provider]  Plant Sterols and Stanols (CHOLESTOFF) 450 MG TABS Take 2 tablets by mouth daily.   Yes [provider]  tamsulosin (FLOMAX) 0.4 MG CAPS capsule Take 0.4 mg by mouth daily.   Yes [provider]  thiamine (VITAMIN B-1) 50 MG tablet Take 50 mg by mouth daily.   Yes [provider]  Turmeric 500  MG CAPS Take 1 capsule by mouth daily.   Yes [provider]  Zinc 50 MG CAPS Take 1 capsule by mouth daily.   Yes [provider]  cefUROXime (CEFTIN) 500 MG tablet Take 1 tablet (500 mg total) by mouth 2 (two) times daily with a meal for 7 days. 10/16/17 10/23/17  Stoioff, Ronda Fairly, MD  losartan-hydrochlorothiazide (HYZAAR) 50-12.5 MG per tablet Take 1 tablet by mouth daily. Patient not taking: Reported on 10/16/2017 09/16/14   [name], MD    Allergies Amlodipine; Azithromycin; and Lisinopril  Family History  Problem Relation Age of Onset  . Hypertension Father   . Hyperlipidemia Father   . Cancer Sister        thyroid - dx in late 20's    Social History Social History   Tobacco Use  . Smoking status: Former Smoker    Last attempt to quit: 07/05/1965    Years since quitting: 52.3  . Smokeless tobacco: Never Used  Substance Use Topics  . Alcohol use: Yes    Alcohol/week: 0.6 oz    Types: 1 Standard drinks or equivalent per week    Comment: occassionaly  . Drug use: No    Review of Systems Constitutional: No fever/chills Eyes: No visual changes. ENT: No sore throat. Cardiovascular: Denies chest pain. Respiratory: Denies shortness of breath. Gastrointestinal: Positive for abdominal pain.  Positive for nausea, no vomiting.  No diarrhea.  No constipation. Genitourinary: Negative for dysuria. Musculoskeletal: Positive for back pain. Skin: Negative for rash. Neurological: Negative for headaches, focal weakness or numbness.   ____________________________________________   PHYSICAL EXAM:  VITAL SIGNS: ED Triage Vitals  Enc Vitals Group     BP 10/15/17 2204 140/86     Pulse Rate 10/15/17 2204 91     Resp 10/15/17 2204 17     Temp 10/15/17 2204 98.8 F (37.1 C)     Temp Source 10/15/17 2204 Oral     SpO2 10/15/17 2204 95 %     Weight 10/15/17 2204 210 lb (95.3 kg)     Height 10/15/17 2204 5\' 8"  (1.727 m)     Head Circumference --      Peak  Flow --      Pain Score 10/16/17 0054 10     Pain Loc --      Pain Edu? --      Excl. in [place]? --     Constitutional: Alert and oriented x4 appears uncomfortable nontoxic no diaphoresis speaks full clear sentences Eyes: PERRL EOMI. Head: Atraumatic. Nose: No congestion/rhinnorhea. Mouth/Throat: No trismus Neck: No stridor.   Cardiovascular: Normal rate, regular rhythm. Grossly normal heart sounds.  Good peripheral circulation. Respiratory: Normal respiratory effort.  No retractions. Lungs CTAB and moving good air Gastrointestinal: Soft nondistended he is tender in his right abdomen with positive costovertebral tenderness on the right Musculoskeletal: No lower extremity edema   Neurologic:  Normal speech and language. No gross focal neurologic  deficits are appreciated. Skin:  Skin is warm, dry and intact. No rash noted. Psychiatric: Mood and affect are normal. Speech and behavior are normal.    ____________________________________________   DIFFERENTIAL includes but not limited to  Nephrolithiasis, pyelonephritis, dislodged stent, appendicitis, diverticulitis ____________________________________________   LABS (all labs ordered are listed, but only abnormal results are displayed)  Labs Reviewed  CBC WITH DIFFERENTIAL/PLATELET - Abnormal; Notable for the following components:      Result Value   WBC 16.0 (*)    Neutro Abs 14.1 (*)    All other components within normal limits  BASIC METABOLIC PANEL - Abnormal; Notable for the following components:   Chloride 100 (*)    Glucose, Bld 157 (*)    BUN 38 (*)    Creatinine, Ser 2.28 (*)    GFR calc non Af Amer 25 (*)    GFR calc Af Amer 29 (*)    All other components within normal limits  URINALYSIS, COMPLETE (UACMP) WITH MICROSCOPIC - Abnormal; Notable for the following components:   Color, Urine BROWN (*)    APPearance CLOUDY (*)    Glucose, UA   (*)    Value: TEST NOT REPORTED DUE TO COLOR INTERFERENCE OF URINE PIGMENT    Hgb urine dipstick   (*)    Value: TEST NOT REPORTED DUE TO COLOR INTERFERENCE OF URINE PIGMENT   Bilirubin Urine   (*)    Value: TEST NOT REPORTED DUE TO COLOR INTERFERENCE OF URINE PIGMENT   Ketones, ur   (*)    Value: TEST NOT REPORTED DUE TO COLOR INTERFERENCE OF URINE PIGMENT   Protein, ur   (*)    Value: TEST NOT REPORTED DUE TO COLOR INTERFERENCE OF URINE PIGMENT   Nitrite   (*)    Value: TEST NOT REPORTED DUE TO COLOR INTERFERENCE OF URINE PIGMENT   Leukocytes, UA   (*)    Value: TEST NOT REPORTED DUE TO COLOR INTERFERENCE OF URINE PIGMENT   Bacteria, UA RARE (*)    All other components within normal limits    Lab work reviewed by me with no evidence of urinary tract infection __________________________________________  EKG   ____________________________________________  RADIOLOGY  CT scan stone protocol reviewed by me concerning for perforation on the right ____________________________________________   PROCEDURES  Procedure(s) performed: no  Procedures  Critical Care performed: no  Observation: no ____________________________________________   INITIAL IMPRESSION / ASSESSMENT AND PLAN / ED COURSE  Pertinent labs & imaging results that were available during my care of the patient were reviewed by me and considered in my medical decision making (see chart for details).      ----------------------------------------- 5:41 AM on 10/16/2017 -----------------------------------------  The patient's CT scan is back showing a perforation from the right sided stent along with free air.  Urology consultation pending now.  His pain is currently adequately controlled.  ____  ----------------------------------------- 5:46 AM on 10/16/2017 -----------------------------------------  I discussed with the on-call urologist Dr.  Bernardo Heater who recommends inpatient admission to the hospitalist service and he will take the patient to the operating room this morning.  I  discussed with the patient and his wife who verbalized understanding and agreement with the plan.  I then discussed with the hospitalist who is graciously agreed to admit the patient and his service.  FINAL CLINICAL IMPRESSION(S) / ED DIAGNOSES  Final diagnoses:  Right upper quadrant abdominal pain  Nephrolithiasis  Ureter injury with open wound into cavity, initial encounter  Free intraperitoneal air  NEW MEDICATIONS STARTED DURING THIS VISIT:  Discharge Medication List as of 10/16/2017  1:48 PM    START taking these medications   Details  cefUROXime (CEFTIN) 500 MG tablet Take 1 tablet (500 mg total) by mouth 2 (two) times daily with a meal for 7 days., Starting Sun 10/16/2017, Until Sun 10/23/2017, Normal         Note:  This document was prepared using Dragon voice recognition software and may include unintentional dictation errors.     Darel Hong, MD 10/19/17 2159

## 2017-10-16 NOTE — H&P (Signed)
[facility] NAME: Jack [name]    MR#:  [id number]  DATE OF BIRTH:  [birth year]  DATE OF ADMISSION:  10/16/2017  PRIMARY CARE PHYSICIAN: [name], MD   REQUESTING/REFERRING PHYSICIAN:   CHIEF COMPLAINT:   Chief Complaint  Patient presents with  . Abdominal Pain    HISTORY OF PRESENT ILLNESS: Jack [name]  is a 84 y.o. male with a known history of hyperlipidemia, hypertension, prostate hypertrophy had ureteral stent placement on Friday at [place].  Patient has severe pain in the right groin area since Friday.  He took some ibuprofen and hydrocodone at home but the pain did not relieve.  He presented to the emergency room and was worked up with CT kidney stone protocol which showed ureteral perforation with stent migration.  Case was discussed by ER physician with urologist on call who recommended surgical intervention.  Hospitalist service was consulted by ER physician for admission.  No complaints of any chest pain, shortness of breath.  No fever and chills.  No complaints of any hematuria.  PAST MEDICAL HISTORY:   Past Medical History:  Diagnosis Date  . Elevated prostate specific antigen (PSA)    has been 7 for a year   . History of colon polyps 2008   [facility],   . Hyperlipidemia   . Hypertension   . Kidney stones   . Prostate hypertrophy    diagnosed at age 46 due to hematospermia    PAST SURGICAL HISTORY:  Past Surgical History:  Procedure Laterality Date  . RESECTION SOFT TISSUE TUMOR LEG / ANKLE RADICAL  jan 2009   [facility],  right thigh/knee , nonmalignant  . SMALL INTESTINE SURGERY  1946   implaed on picket fence, punctured stomach    SOCIAL HISTORY:  Social History   Tobacco Use  . Smoking status: Former Smoker    Last attempt to quit: 07/05/1965    Years since quitting: 52.3  . Smokeless tobacco: Never Used  Substance Use Topics  . Alcohol use: Yes    Alcohol/week:  0.6 oz    Types: 1 Standard drinks or equivalent per week    Comment: occassionaly    FAMILY HISTORY:  Family History  Problem Relation Age of Onset  . Hypertension Father   . Hyperlipidemia Father   . Cancer Sister        thyroid - dx in late 20's    DRUG ALLERGIES:  Allergies  Allergen Reactions  . Amlodipine     LE edema  . Azithromycin Nausea And Vomiting  . Lisinopril Cough    REVIEW OF SYSTEMS:   CONSTITUTIONAL: No fever, fatigue or weakness.  EYES: No blurred or double vision.  EARS, NOSE, AND THROAT: No tinnitus or ear pain.  RESPIRATORY: No cough, shortness of breath, wheezing or hemoptysis.  CARDIOVASCULAR: No chest pain, orthopnea, edema.  GASTROINTESTINAL: No nausea, vomiting, diarrhea or abdominal pain.  GENITOURINARY: No dysuria, hematuria.  Groin pain present ENDOCRINE: No polyuria, nocturia,  HEMATOLOGY: No anemia, easy bruising or bleeding SKIN: No rash or lesion. MUSCULOSKELETAL: No joint pain or arthritis.   NEUROLOGIC: No tingling, numbness, weakness.  PSYCHIATRY: No anxiety or depression.   MEDICATIONS AT HOME:  Prior to Admission medications   Medication Sig Start Date End Date Taking? Authorizing Provider  Ascorbic Acid (VITAMIN C) 1000 MG tablet Take 1,000 mg by mouth daily.    [provider]  beta carotene 25000 UNIT capsule Take 25,000  Units by mouth daily.    [provider]  cholecalciferol (VITAMIN D) 1000 UNITS tablet Take 1,000 Units by mouth daily.    [provider]  HYDROmorphone (DILAUDID) 2 MG tablet Take 1 tablet (2 mg total) by mouth every 12 (twelve) hours as needed for severe pain. 09/23/16   Daymon Larsen, MD  ibuprofen (ADVIL,MOTRIN) 800 MG tablet Take 1 tablet (800 mg total) by mouth every 8 (eight) hours as needed. 09/23/16   Daymon Larsen, MD  losartan-hydrochlorothiazide (HYZAAR) 50-12.5 MG per tablet Take 1 tablet by mouth daily. 09/16/14   [name], MD  Magnesium 250 MG TABS Take 2  tablets by mouth daily.    [provider]  Misc Natural Products (OSTEO BI-FLEX ADV DOUBLE ST PO) Take 2 tablets by mouth daily.    [provider]  Multiple Vitamins-Minerals (PRESERVISION AREDS 2 PO) Take 2 capsules by mouth daily.    [provider]  Omega-3 Fatty Acids (FISH OIL) 1200 MG CAPS Take 2 capsules by mouth daily.    [provider]  ondansetron (ZOFRAN) 4 MG tablet Take 1 tablet (4 mg total) by mouth every 8 (eight) hours as needed for nausea or vomiting. 09/23/16   Daymon Larsen, MD  Plant Sterols and Stanols (CHOLESTOFF) 450 MG TABS Take 2 tablets by mouth daily.    [provider]  tadalafil (CIALIS) 5 MG tablet Take 5 mg by mouth daily as needed for erectile dysfunction.    [provider]  thiamine (VITAMIN B-1) 50 MG tablet Take 50 mg by mouth daily.    [provider]  Turmeric 500 MG CAPS Take 1 capsule by mouth daily.    [provider]  Zinc 50 MG CAPS Take 1 capsule by mouth daily.    [provider]      PHYSICAL EXAMINATION:   VITAL SIGNS: Blood pressure (!) 144/96, pulse (!) 103, temperature 98.8 F (37.1 C), temperature source Oral, resp. rate 18, height 5\' 8"  (1.727 m), weight 95.3 kg (210 lb), SpO2 93 %.  GENERAL:  84 y.o.-year-old patient lying in the bed with no acute distress.  EYES: Pupils equal, round, reactive to light and accommodation. No scleral icterus. Extraocular muscles intact.  HEENT: Head atraumatic, normocephalic. Oropharynx and nasopharynx clear.  NECK:  Supple, no jugular venous distention. No thyroid enlargement, no tenderness.  LUNGS: Normal breath sounds bilaterally, no wheezing, rales,rhonchi or crepitation. No use of accessory muscles of respiration.  CARDIOVASCULAR: S1, S2 normal. No murmurs, rubs, or gallops.  ABDOMEN: Soft, nontender, nondistended. Bowel sounds present. No organomegaly or mass.  Has groin pain. EXTREMITIES: No pedal edema, cyanosis,  or clubbing.  NEUROLOGIC: Cranial nerves II through XII are intact. Muscle strength 5/5 in all extremities. Sensation intact. Gait not checked.  PSYCHIATRIC: The patient is alert and oriented x 3.  SKIN: No obvious rash, lesion, or ulcer.   LABORATORY PANEL:   CBC Recent Labs  Lab 10/16/17 0059  WBC 16.0*  HGB 15.5  HCT 45.8  PLT 224  MCV 89.4  MCH 30.3  MCHC 33.9  RDW 13.6  LYMPHSABS 1.1  MONOABS 0.8  EOSABS 0.0  BASOSABS 0.1   ------------------------------------------------------------------------------------------------------------------  Chemistries  Recent Labs  Lab 10/16/17 0059  NA 135  K 3.9  CL 100*  CO2 22  GLUCOSE 157*  BUN 38*  CREATININE 2.28*  CALCIUM 9.5   ------------------------------------------------------------------------------------------------------------------ estimated creatinine clearance is 28 mL/min (A) (by C-G formula based on SCr of  2.28 mg/dL (H)). ------------------------------------------------------------------------------------------------------------------ No results for input(s): TSH, T4TOTAL, T3FREE, THYROIDAB in the last 72 hours.  Invalid input(s): FREET3   Coagulation profile No results for input(s): INR, PROTIME in the last 168 hours. ------------------------------------------------------------------------------------------------------------------- No results for input(s): DDIMER in the last 72 hours. -------------------------------------------------------------------------------------------------------------------  Cardiac Enzymes No results for input(s): CKMB, TROPONINI, MYOGLOBIN in the last 168 hours.  Invalid input(s): CK ------------------------------------------------------------------------------------------------------------------ Invalid input(s): POCBNP  ---------------------------------------------------------------------------------------------------------------  Urinalysis    Component Value  Date/Time   COLORURINE BROWN (A) 10/16/2017 0525   APPEARANCEUR CLOUDY (A) 10/16/2017 0525   LABSPEC 1.020 10/16/2017 0525   PHURINE  10/16/2017 0525    TEST NOT REPORTED DUE TO COLOR INTERFERENCE OF URINE PIGMENT   GLUCOSEU (A) 10/16/2017 0525    TEST NOT REPORTED DUE TO COLOR INTERFERENCE OF URINE PIGMENT   HGBUR (A) 10/16/2017 0525    TEST NOT REPORTED DUE TO COLOR INTERFERENCE OF URINE PIGMENT   BILIRUBINUR (A) 10/16/2017 0525    TEST NOT REPORTED DUE TO COLOR INTERFERENCE OF URINE PIGMENT   KETONESUR (A) 10/16/2017 0525    TEST NOT REPORTED DUE TO COLOR INTERFERENCE OF URINE PIGMENT   PROTEINUR (A) 10/16/2017 0525    TEST NOT REPORTED DUE TO COLOR INTERFERENCE OF URINE PIGMENT   NITRITE (A) 10/16/2017 0525    TEST NOT REPORTED DUE TO COLOR INTERFERENCE OF URINE PIGMENT   LEUKOCYTESUR (A) 10/16/2017 0525    TEST NOT REPORTED DUE TO COLOR INTERFERENCE OF URINE PIGMENT     RADIOLOGY: Dg Abdomen 1 View  Result Date: 10/16/2017 CLINICAL DATA:  Right flank pain. EXAM: ABDOMEN - 1 VIEW COMPARISON:  Radiograph of October 11, 2017. FINDINGS: The bowel gas pattern is normal. Interval placement of bilateral ureteral stents which appear to be in grossly good position, although the proximal loop of the right ureteral stent is not formed. Phleboliths are noted in the pelvis. Status post hernia repair. IMPRESSION: Interval placement of bilateral ureteral stents. No evidence of bowel obstruction or ileus. Electronically Signed   By: Marijo Conception, M.D.   On: 10/16/2017 01:33   Ct Renal Stone Study  Result Date: 10/16/2017 CLINICAL DATA:  Urinary incontinence since ureteral stents placed 2 days ago. RIGHT flank pain, painful urination. Gross hematuria. History of hypertension, hyperlipidemia, prostate hypertrophy. EXAM: CT ABDOMEN AND PELVIS WITHOUT CONTRAST TECHNIQUE: Multidetector CT imaging of the abdomen and pelvis was performed following the standard protocol without IV contrast. COMPARISON:   CT abdomen and pelvis September 23, 2016 and abdominal radiograph October 16, 2017 FINDINGS: LOWER CHEST: Bibasilar atelectasis/scarring. Heart size is upper limits of normal. Mild coronary artery calcifications. No pericardial effusion. HEPATOBILIARY: Normal. PANCREAS: Normal. SPLEEN: Normal. ADRENALS/URINARY TRACT: Kidneys are orthotopic, demonstrating normal size and morphology. Inferiorly migrated RIGHT nephroureteral stent, proximal retaining loop is unfurled and, perforated with tip outside the ureter. Mild RIGHT hydronephrosis. Moderate volume free fluid RIGHT perinephric space with free air. Well-positioned LEFT nephroureteral stent with punctate LEFT lower pole nephrolithiasis. 2 mm RIGHT lower pole nephrolithiasis. STOMACH/BOWEL: Moderate hiatal hernia. The stomach, small and large bowel are normal in course and caliber without inflammatory changes, sensitivity decreased by lack of enteric contrast. Small distal ileal lipoma. Normal appendix. VASCULAR/LYMPHATIC: Aortoiliac vessels are normal in course and caliber. Moderate calcific atherosclerosis. No lymphadenopathy by CT size criteria. REPRODUCTIVE: Prostatomegaly. OTHER: No intraperitoneal free fluid or free air. Small bilateral fat containing inguinal hernias. Status post LEFT inguinal herniorrhaphy with mesh. Small fat containing umbilical hernia. MUSCULOSKELETAL: Non-acute. Sacroiliac ankylosis. Osteopenia. Minimal grade 1  L4-5 anterolisthesis. IMPRESSION: 1. Bilateral nephroureteral stents. RIGHT proximal retaining loop unfurled with perforation. Moderate RIGHT perinephric free fluid/urine and, small volume free air. Mild RIGHT hydronephrosis. Recommend urologic consultation. 2. Bilateral nephrolithiasis measuring to 2 mm. 3. Acute findings discussed with and reconfirmed by Dr.JADE SUNG on 10/16/2017 at 5:30 am. Aortic Atherosclerosis (ICD10-I70.0). Electronically Signed   By: Elon Alas M.D.   On: 10/16/2017 05:32    EKG: No orders found for  this or any previous visit.  IMPRESSION AND PLAN: 84 year old male patient with history of nephrolithiasis, ureteral stent placement, hypertension, hyperlipidemia, prostate hypertrophy presented to the emergency room with right groin pain.  Admitting diagnosis 1.  Ureteral perforation 2.  Right flank pain 3.  Nephrolithiasis 4.  Hypertension 5.  Hyperlipidemia 6.  Acute renal insufficiency Treatment plan Admit patient to medical floor N.p.o. IV fluids Urology consultation Control pain with IV Dilaudid IV Rocephin antibiotic 1 g daily  All the records are reviewed and case discussed with ED provider. Management plans discussed with the patient, family and they are in agreement.  CODE STATUS:FULL CODE Code Status History    This patient does not have a recorded code status. Please follow your organizational policy for patients in this situation.       TOTAL TIME TAKING CARE OF THIS PATIENT: 50 minutes.    Saundra Shelling M.D on 10/16/2017 at 6:22 AM  Between 7am to 6pm - Pager - [phone]  After 6pm go to [email or web address] - password [email or web address] [facility]  Office  [phone]  CC: Primary care physician; [name], MD

## 2017-10-16 NOTE — ED Notes (Signed)
Pt assisted to bathroom via wheelchair to change his incontinence pad; pt says he has no control over his bladder since he had ureteral stent placed yesterday; pt says he is unable to void "on command"; pt reports pain to right back/flank 9-10/10; pt given Dilaudid when he arrived and says that has worn off; pt denies nausea at this time

## 2017-10-16 NOTE — Anesthesia Preprocedure Evaluation (Signed)
Anesthesia Evaluation  Patient identified by MRN, date of birth, ID band Patient awake    Reviewed: Allergy & Precautions, H&P , NPO status , Patient's Chart, lab work & pertinent test results, reviewed documented beta blocker date and time   Airway Mallampati: II  TM Distance: >3 FB Neck ROM: full    Dental  (+) Teeth Intact   Pulmonary neg pulmonary ROS, former smoker,    Pulmonary exam normal        Cardiovascular Exercise Tolerance: Good hypertension, On Medications negative cardio ROS Normal cardiovascular exam Rate:Normal     Neuro/Psych negative neurological ROS  negative psych ROS   GI/Hepatic negative GI ROS, Neg liver ROS, GERD  Medicated,  Endo/Other  negative endocrine ROS  Renal/GU Renal diseasenegative Renal ROS  negative genitourinary   Musculoskeletal   Abdominal   Peds  Hematology negative hematology ROS (+)   Anesthesia Other Findings   Reproductive/Obstetrics negative OB ROS                             Anesthesia Physical Anesthesia Plan  ASA: III and emergent  Anesthesia Plan: General LMA   Post-op Pain Management:    Induction:   PONV Risk Score and Plan: 3  Airway Management Planned:   Additional Equipment:   Intra-op Plan:   Post-operative Plan:   Informed Consent: I have reviewed the patients History and Physical, chart, labs and discussed the procedure including the risks, benefits and alternatives for the proposed anesthesia with the patient or authorized representative who has indicated his/her understanding and acceptance.     Plan Discussed with: CRNA  Anesthesia Plan Comments:         Anesthesia Quick Evaluation

## 2017-10-16 NOTE — ED Notes (Signed)
Pt with wife reports CT stones hx, and past 2022/12/10 had passed kidney stones, and then Friday had procedure to place stents in both ureters with strings showing from meatus , pt stated "I am to remove them Monday", and has had terrible pain after procedure,

## 2017-10-16 NOTE — Consult Note (Signed)
Urology Consult  I have been asked to see the patient by Dr. Mable Paris, for evaluation and management of a migrated ureteral stent.  Chief Complaint: Flank pain  History of Present Illness: Jack [name] is a 84 y.o. year old with a history of bilateral distal ureteral calculi who underwent bilateral ureteroscopcopy with stent placement in [place] on 10/14/2017.  The operative records were not available for review.  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  He states no stones were identified in the ureters.  He was to remove his stents on 2/4.  He presented to the ED last night complaining of severe pain in the right groin region since his surgery.  CT the abdomen pelvis was performed which showed migration of the stent to the proximal ureter with the proximal stent tip appearing to be outside the ureter.  Perinephric free fluid and a small amount of air was noted.  The left ureteral stent was in good position.  Patient denies fever or chills.  He has been afebrile.  Past Medical History:  Diagnosis Date  . Elevated prostate specific antigen (PSA)    has been 7 for a year   . History of colon polyps 2008   [facility],   . Hyperlipidemia   . Hypertension   . Kidney stones   . Prostate hypertrophy    diagnosed at age 50 due to hematospermia    Past Surgical History:  Procedure Laterality Date  . RESECTION SOFT TISSUE TUMOR LEG / ANKLE RADICAL  jan 2009   [facility],  right thigh/knee , nonmalignant  . SMALL INTESTINE SURGERY  1946   implaed on picket fence, punctured stomach    Home Medications:  No outpatient medications have been marked as taking for the 10/16/17 encounter [facility] Encounter).    Allergies:  Allergies  Allergen Reactions  . Amlodipine     LE edema  . Azithromycin Nausea And Vomiting  . Lisinopril Cough    Family History  Problem Relation Age of Onset  . Hypertension Father   . Hyperlipidemia Father   . Cancer Sister    thyroid - dx in late 20's    Social History:  reports that he quit smoking about 52 years ago. he has never used smokeless tobacco. He reports that he drinks about 0.6 oz of alcohol per week. He reports that he does not use drugs.  ROS: A complete review of systems was performed.  All systems are negative except for pertinent findings as noted.  Physical Exam:  Vital signs in last 24 hours: Temp:  [98.8 F (37.1 C)] 98.8 F (37.1 C) (02/02 2204) Pulse Rate:  [91-104] 103 (02/03 0611) Resp:  [17-18] 18 (02/03 0611) BP: (140-165)/(86-103) 144/96 (02/03 0611) SpO2:  [91 %-95 %] 93 % (02/03 0611) Weight:  [210 lb (95.3 kg)] 210 lb (95.3 kg) (02/02 2204) Constitutional:  Alert and oriented, No acute distress HEENT: [place] AT, moist mucus membranes.  Trachea midline, no masses Cardiovascular: Regular rate and rhythm, no clubbing, cyanosis, or edema. Respiratory: Normal respiratory effort, lungs clear bilaterally GI: Abdomen is soft, nontender, nondistended, no abdominal masses GU: No CVA tenderness Skin: No rashes, bruises or suspicious lesions Lymph: No cervical or inguinal adenopathy Neurologic: Grossly intact, no focal deficits, moving all 4 extremities Psychiatric: Normal mood and affect   Laboratory Data:  Recent Labs    10/16/17 0059  WBC 16.0*  HGB 15.5  HCT 45.8   Recent Labs  10/16/17 0059  NA 135  K 3.9  CL 100*  CO2 22  GLUCOSE 157*  BUN 38*  CREATININE 2.28*  CALCIUM 9.5   No results for input(s): LABPT, INR in the last 72 hours. No results for input(s): LABURIN in the last 72 hours. Results for orders placed or performed in visit on 01/09/13  Fecal occult blood, imunochemical     Status: None   Collection Time: 01/11/13  7:41 AM  Result Value Ref Range Status   Fecal Occult Bld Negative Negative Final     Radiologic Imaging: Dg Abdomen 1 View  Result Date: 10/16/2017 CLINICAL DATA:  Right flank pain. EXAM: ABDOMEN - 1 VIEW COMPARISON:  Radiograph of  October 11, 2017. FINDINGS: The bowel gas pattern is normal. Interval placement of bilateral ureteral stents which appear to be in grossly good position, although the proximal loop of the right ureteral stent is not formed. Phleboliths are noted in the pelvis. Status post hernia repair. IMPRESSION: Interval placement of bilateral ureteral stents. No evidence of bowel obstruction or ileus. Electronically Signed   By: Marijo Conception, M.D.   On: 10/16/2017 01:33   Ct Renal Stone Study  Result Date: 10/16/2017 CLINICAL DATA:  Urinary incontinence since ureteral stents placed 2 days ago. RIGHT flank pain, painful urination. Gross hematuria. History of hypertension, hyperlipidemia, prostate hypertrophy. EXAM: CT ABDOMEN AND PELVIS WITHOUT CONTRAST TECHNIQUE: Multidetector CT imaging of the abdomen and pelvis was performed following the standard protocol without IV contrast. COMPARISON:  CT abdomen and pelvis September 23, 2016 and abdominal radiograph October 16, 2017 FINDINGS: LOWER CHEST: Bibasilar atelectasis/scarring. Heart size is upper limits of normal. Mild coronary artery calcifications. No pericardial effusion. HEPATOBILIARY: Normal. PANCREAS: Normal. SPLEEN: Normal. ADRENALS/URINARY TRACT: Kidneys are orthotopic, demonstrating normal size and morphology. Inferiorly migrated RIGHT nephroureteral stent, proximal retaining loop is unfurled and, perforated with tip outside the ureter. Mild RIGHT hydronephrosis. Moderate volume free fluid RIGHT perinephric space with free air. Well-positioned LEFT nephroureteral stent with punctate LEFT lower pole nephrolithiasis. 2 mm RIGHT lower pole nephrolithiasis. STOMACH/BOWEL: Moderate hiatal hernia. The stomach, small and large bowel are normal in course and caliber without inflammatory changes, sensitivity decreased by lack of enteric contrast. Small distal ileal lipoma. Normal appendix. VASCULAR/LYMPHATIC: Aortoiliac vessels are normal in course and caliber. Moderate  calcific atherosclerosis. No lymphadenopathy by CT size criteria. REPRODUCTIVE: Prostatomegaly. OTHER: No intraperitoneal free fluid or free air. Small bilateral fat containing inguinal hernias. Status post LEFT inguinal herniorrhaphy with mesh. Small fat containing umbilical hernia. MUSCULOSKELETAL: Non-acute. Sacroiliac ankylosis. Osteopenia. Minimal grade 1 L4-5 anterolisthesis. IMPRESSION: 1. Bilateral nephroureteral stents. RIGHT proximal retaining loop unfurled with perforation. Moderate RIGHT perinephric free fluid/urine and, small volume free air. Mild RIGHT hydronephrosis. Recommend urologic consultation. 2. Bilateral nephrolithiasis measuring to 2 mm. 3. Acute findings discussed with and reconfirmed by Dr.JADE SUNG on 10/16/2017 at 5:30 am. Aortic Atherosclerosis (ICD10-I70.0). Electronically Signed   By: Elon Alas M.D.   On: 10/16/2017 05:32    Impression/Assessment:  Migration of right ureteral stent  Plan:  Recommend cystoscopy with exchange/replacement of his right ureteral stent.  The procedure was discussed in detail including potential risks of bleeding, infection and ureteral injury.  The small chance of the need for percutaneous nephrostomy tube placement was discussed.  Possible need for ureteroscopy was also discussed.  He indicated all questions were answered to his satisfaction and desires to proceed.  10/16/2017, 6:51 AM  John Giovanni,  MD

## 2017-10-16 NOTE — Progress Notes (Signed)
Pharmacy Antibiotic Note  BAILEN GEFFRE is a 84 y.o. male admitted on 10/16/2017 with UTI.  Pharmacy has been consulted for ceftriaxone dosing.  Plan: Ceftriaxone 2g IV daily  Height: 5\' 8"  (172.7 cm) Weight: 210 lb (95.3 kg) IBW/kg (Calculated) : 68.4  Temp (24hrs), Avg:98.8 F (37.1 C), Min:98.8 F (37.1 C), Max:98.8 F (37.1 C)  Recent Labs  Lab 10/16/17 0059  WBC 16.0*  CREATININE 2.28*    Estimated Creatinine Clearance: 28 mL/min (A) (by C-G formula based on SCr of 2.28 mg/dL (H)).    Allergies  Allergen Reactions  . Amlodipine     LE edema  . Azithromycin Nausea And Vomiting  . Lisinopril Cough    Thank you for allowing pharmacy to be a part of this patient's care.  Tobie Lords, PharmD, BCPS Clinical Pharmacist 10/16/2017

## 2017-10-16 NOTE — Anesthesia Post-op Follow-up Note (Signed)
Anesthesia QCDR form completed.        

## 2017-10-16 NOTE — Progress Notes (Signed)
15 minute call to floor. 

## 2017-10-16 NOTE — Transfer of Care (Signed)
Immediate Anesthesia Transfer of Care Note  Patient: Jack [name]  Procedure(s) Performed: right  URETERAL STENT PLACEMENT,cystoscopy bilateral stent removal,rretrograde (Right )  Patient Location: PACU  Anesthesia Type:General  Level of Consciousness: sedated  Airway & Oxygen Therapy: Patient Spontanous Breathing and Patient connected to face mask oxygen  Post-op Assessment: Report given to RN and Post -op Vital signs reviewed and stable  Post vital signs: Reviewed and stable  Last Vitals:  Vitals:   10/16/17 0611 10/16/17 0823  BP: (!) 144/96 (!) 162/104  Pulse: (!) 103 (!) 110  Resp: 18 (!) 21  Temp:  37.5 C  SpO2: 93% 98%    Last Pain:  Vitals:   10/16/17 0611  TempSrc:   PainSc: 5          Complications: No apparent anesthesia complications

## 2017-10-16 NOTE — Care Management Obs Status (Signed)
[place] NOTIFICATION   Patient Details  Name: Jack [name] MRN: [id number] Date of Birth: 21-Sep-1932   Medicare Observation Status Notification Given:  Yes    Marline Morace A, RN 10/16/2017, 1:29 PM

## 2017-10-16 NOTE — Discharge Summary (Signed)
[place] [facility] [place] NAME: Jack [name]    MR#:  [id number]  DATE OF BIRTH:  1933-05-20  DATE OF ADMISSION:  10/16/2017 ADMITTING PHYSICIAN: Saundra Shelling, MD  DATE OF DISCHARGE: 10/16/2017  2:05 PM  PRIMARY CARE PHYSICIAN: [name], MD    ADMISSION DIAGNOSIS:  Nephrolithiasis [N20.0] Right upper quadrant abdominal pain [R10.11] Free intraperitoneal air [K66.8] Ureter injury with open wound into cavity, initial encounter [S37.10XA]  DISCHARGE DIAGNOSIS:  Active Problems:   Ureteral perforation secondary to stent manipulation   Abdominal pain   SECONDARY DIAGNOSIS:   Past Medical History:  Diagnosis Date  . Elevated prostate specific antigen (PSA)    has been 7 for a year   . History of colon polyps 2008   [facility],   . Hyperlipidemia   . Hypertension   . Kidney stones   . Prostate hypertrophy    diagnosed at age 51 due to hematospermia    HOSPITAL COURSE:   1.  Severe abdominal pain, migration of right ureteral stent, perforation right proximal ureter, migration of left ureteral stent.  Patient came to the ER yesterday evening and took a long time to be seen.  CT scan showing right ureter perforation.  Patient was seen by Dr. Bernardo Heater and brought to the operating room for removal of stents and replacement of right ureteral stent.  The patient was pain-free and was cleared by urology to go home.  Urology E prescribed Ceftin.  Urology will follow-up as outpatient.  Okay to go back on Flomax. 2.  Acute kidney injury.  Urology feels like it was secondary to the stent migration, hydronephrosis.  Repeat BMP as outpatient. 3.  Essential hypertension on losartan HCT 4.  Hyperlipidemia unspecified on Colest-off    DISCHARGE CONDITIONS:   Satisfactory  CONSULTS OBTAINED:  Treatment Team:  Abbie Sons, MD  DRUG ALLERGIES:   Allergies  Allergen Reactions  . Amlodipine     LE edema  . Azithromycin Nausea And  Vomiting  . Lisinopril Cough    DISCHARGE MEDICATIONS:   Allergies as of 10/16/2017      Reactions   Amlodipine    LE edema   Azithromycin Nausea And Vomiting   Lisinopril Cough      Medication List    STOP taking these medications   HYDROmorphone 2 MG tablet Commonly known as:  DILAUDID   ibuprofen 800 MG tablet Commonly known as:  ADVIL,MOTRIN     TAKE these medications   beta carotene 25000 UNIT capsule Take 25,000 Units by mouth daily.   cefUROXime 500 MG tablet Commonly known as:  CEFTIN Take 1 tablet (500 mg total) by mouth 2 (two) times daily with a meal for 7 days.   cholecalciferol 1000 units tablet Commonly known as:  VITAMIN D Take 1,000 Units by mouth daily.   CHOLESTOFF 450 MG Tabs Generic drug:  Plant Sterols and Stanols Take 2 tablets by mouth daily.   Fish Oil 1200 MG Caps Take 2 capsules by mouth daily.   losartan-hydrochlorothiazide 50-12.5 MG tablet Commonly known as:  HYZAAR Take 1 tablet by mouth daily.   Magnesium 250 MG Tabs Take 2 tablets by mouth daily.   ondansetron 4 MG tablet Commonly known as:  ZOFRAN Take 1 tablet (4 mg total) by mouth every 8 (eight) hours as needed for nausea or vomiting.   OSTEO BI-FLEX ADV DOUBLE ST PO Take 2 tablets by mouth daily.   PRESERVISION AREDS 2 PO  Take 2 capsules by mouth daily.   thiamine 50 MG tablet Commonly known as:  VITAMIN B-1 Take 50 mg by mouth daily.   Turmeric 500 MG Caps Take 1 capsule by mouth daily.   vitamin C 1000 MG tablet Take 1,000 mg by mouth daily.   Zinc 50 MG Caps Take 1 capsule by mouth daily.     ASK your doctor about these medications   alfuzosin 10 MG 24 hr tablet Commonly known as:  UROXATRAL Take 10 mg by mouth daily with breakfast.   HYDROcodone-acetaminophen 5-325 MG tablet Commonly known as:  NORCO/VICODIN Take 1 tablet by mouth every 6 (six) hours as needed. for pain   losartan 25 MG tablet Commonly known as:  COZAAR Take 12.5 mg by mouth  daily.   oxybutynin 5 MG tablet Commonly known as:  DITROPAN Take 5 mg by mouth 3 (three) times daily.   tamsulosin 0.4 MG Caps capsule Commonly known as:  FLOMAX Take 0.4 mg by mouth daily.        DISCHARGE INSTRUCTIONS:   Follow-up with Dr. Bernardo Heater 1 week Follow-up with Dr. Lovie Macadamia 1 week  If you experience worsening of your admission symptoms, develop shortness of breath, life threatening emergency, suicidal or homicidal thoughts you must seek medical attention immediately by calling 911 or calling your MD immediately  if symptoms less severe.  You Must read complete instructions/literature along with all the possible adverse reactions/side effects for all the Medicines you take and that have been prescribed to you. Take any new Medicines after you have completely understood and accept all the possible adverse reactions/side effects.   Please note  You were cared for by a hospitalist during your hospital stay. If you have any questions about your discharge medications or the care you received while you were in the hospital after you are discharged, you can call the unit and asked to speak with the hospitalist on call if the hospitalist that took care of you is not available. Once you are discharged, your primary care physician will handle any further medical issues. Please note that NO REFILLS for any discharge medications will be authorized once you are discharged, as it is imperative that you return to your primary care physician (or establish a relationship with a primary care physician if you do not have one) for your aftercare needs so that they can reassess your need for medications and monitor your lab values.    Today   CHIEF COMPLAINT:   Chief Complaint  Patient presents with  . Abdominal Pain    HISTORY OF PRESENT ILLNESS:  Jack [name]  is a 84 y.o. male presented with severe abdominal pain   VITAL SIGNS:  Blood pressure 138/96, pulse 98 respirations 16  pulse ox 93%    PHYSICAL EXAMINATION:  GENERAL:  84 y.o.-year-old patient lying in the bed with no acute distress.  EYES: Pupils equal, round, reactive to light and accommodation. No scleral icterus. Extraocular muscles intact.  HEENT: Head atraumatic, normocephalic. Oropharynx and nasopharynx clear.  NECK:  Supple, no jugular venous distention. No thyroid enlargement, no tenderness.  LUNGS: Normal breath sounds bilaterally, no wheezing, rales,rhonchi or crepitation. No use of accessory muscles of respiration.  CARDIOVASCULAR: S1, S2 normal. No murmurs, rubs, or gallops.  ABDOMEN: Soft, non-tender, distended. Bowel sounds present. No organomegaly or mass.  EXTREMITIES: Trace edema, no cyanosis, or clubbing.  NEUROLOGIC: Cranial nerves II through XII are intact. Muscle strength 5/5 in all extremities. Sensation intact. Gait not checked.  PSYCHIATRIC: The patient is alert and oriented x 3.  SKIN: No obvious rash, lesion, or ulcer.   DATA REVIEW:   CBC Recent Labs  Lab 10/16/17 0059  WBC 16.0*  HGB 15.5  HCT 45.8  PLT 224    Chemistries  Recent Labs  Lab 10/16/17 0059  NA 135  K 3.9  CL 100*  CO2 22  GLUCOSE 157*  BUN 38*  CREATININE 2.28*  CALCIUM 9.5       RADIOLOGY:  Dg Abdomen 1 View  Result Date: 10/16/2017 CLINICAL DATA:  Right flank pain. EXAM: ABDOMEN - 1 VIEW COMPARISON:  Radiograph of October 11, 2017. FINDINGS: The bowel gas pattern is normal. Interval placement of bilateral ureteral stents which appear to be in grossly good position, although the proximal loop of the right ureteral stent is not formed. Phleboliths are noted in the pelvis. Status post hernia repair. IMPRESSION: Interval placement of bilateral ureteral stents. No evidence of bowel obstruction or ileus. Electronically Signed   By: Marijo Conception, M.D.   On: 10/16/2017 01:33   Ct Renal Stone Study  Result Date: 10/16/2017 CLINICAL DATA:  Urinary incontinence since ureteral stents placed 2 days  ago. RIGHT flank pain, painful urination. Gross hematuria. History of hypertension, hyperlipidemia, prostate hypertrophy. EXAM: CT ABDOMEN AND PELVIS WITHOUT CONTRAST TECHNIQUE: Multidetector CT imaging of the abdomen and pelvis was performed following the standard protocol without IV contrast. COMPARISON:  CT abdomen and pelvis September 23, 2016 and abdominal radiograph October 16, 2017 FINDINGS: LOWER CHEST: Bibasilar atelectasis/scarring. Heart size is upper limits of normal. Mild coronary artery calcifications. No pericardial effusion. HEPATOBILIARY: Normal. PANCREAS: Normal. SPLEEN: Normal. ADRENALS/URINARY TRACT: Kidneys are orthotopic, demonstrating normal size and morphology. Inferiorly migrated RIGHT nephroureteral stent, proximal retaining loop is unfurled and, perforated with tip outside the ureter. Mild RIGHT hydronephrosis. Moderate volume free fluid RIGHT perinephric space with free air. Well-positioned LEFT nephroureteral stent with punctate LEFT lower pole nephrolithiasis. 2 mm RIGHT lower pole nephrolithiasis. STOMACH/BOWEL: Moderate hiatal hernia. The stomach, small and large bowel are normal in course and caliber without inflammatory changes, sensitivity decreased by lack of enteric contrast. Small distal ileal lipoma. Normal appendix. VASCULAR/LYMPHATIC: Aortoiliac vessels are normal in course and caliber. Moderate calcific atherosclerosis. No lymphadenopathy by CT size criteria. REPRODUCTIVE: Prostatomegaly. OTHER: No intraperitoneal free fluid or free air. Small bilateral fat containing inguinal hernias. Status post LEFT inguinal herniorrhaphy with mesh. Small fat containing umbilical hernia. MUSCULOSKELETAL: Non-acute. Sacroiliac ankylosis. Osteopenia. Minimal grade 1 L4-5 anterolisthesis. IMPRESSION: 1. Bilateral nephroureteral stents. RIGHT proximal retaining loop unfurled with perforation. Moderate RIGHT perinephric free fluid/urine and, small volume free air. Mild RIGHT hydronephrosis.  Recommend urologic consultation. 2. Bilateral nephrolithiasis measuring to 2 mm. 3. Acute findings discussed with and reconfirmed by Dr.JADE SUNG on 10/16/2017 at 5:30 am. Aortic Atherosclerosis (ICD10-I70.0). Electronically Signed   By: Elon Alas M.D.   On: 10/16/2017 05:32     Management plans discussed with the patient, family and they are in agreement.  CODE STATUS:  Code Status History    Date Active Date Inactive Code Status Order ID Comments User Context   10/16/2017 09:14 10/16/2017 17:05 Full Code [id number]  Saundra Shelling, MD Inpatient    Advance Directive Documentation     Most Recent Value  Type of Advance Directive  Healthcare Power of Attorney, Living will  Pre-existing out of facility DNR order (yellow form or pink MOST form)  No data  "MOST" Form in Place?  No data  TOTAL TIME TAKING CARE OF THIS PATIENT: 35 minutes.    Loletha Grayer M.D on 10/16/2017 at 5:20 PM  Between 7am to 6pm - Pager - [phone]  After 6pm go to [email or web address] - password [email or web address] [place] [facility]  [phone]  CC: Primary care physician; [name], MD

## 2017-10-17 ENCOUNTER — Encounter: Payer: Self-pay | Admitting: Urology

## 2017-10-18 NOTE — Anesthesia Postprocedure Evaluation (Signed)
Anesthesia Post Note  Patient: Jack [name]  Procedure(s) Performed: right  URETERAL STENT PLACEMENT,cystoscopy bilateral stent removal,rretrograde (Right )  Patient location during evaluation: PACU Anesthesia Type: General Level of consciousness: awake and alert Pain management: pain level controlled Vital Signs Assessment: post-procedure vital signs reviewed and stable Respiratory status: spontaneous breathing, nonlabored ventilation, respiratory function stable and patient connected to nasal cannula oxygen Cardiovascular status: blood pressure returned to baseline and stable Postop Assessment: no apparent nausea or vomiting Anesthetic complications: no     Last Vitals:  Vitals:   10/16/17 1002 10/16/17 1210  BP: 119/73 138/86  Pulse: 87 98  Resp: 16   Temp: 36.7 C   SpO2: (!) 88%     Last Pain:  Vitals:   10/16/17 1002  TempSrc: Oral  PainSc:                  Molli Barrows

## 2017-10-20 ENCOUNTER — Telehealth: Payer: Self-pay

## 2017-10-20 MED ORDER — OXYBUTYNIN CHLORIDE 5 MG PO TABS: 5.0000 mg | ORAL_TABLET | Freq: Three times a day (TID) | ORAL | 1 refills | Status: DC

## 2017-10-20 NOTE — Telephone Encounter (Signed)
Can come by and pick up Myrbetriq samples 50 mg daily.  Is he taking his oxybutynin as needed?

## 2017-10-20 NOTE — Telephone Encounter (Signed)
Pt called stating he is having horrible issues with urinary urgency since having stents placed. Please advise.

## 2017-10-20 NOTE — Telephone Encounter (Signed)
Spoke with pt in reference to taking oxybutynin for urgency with bilateral stents. Pt states that he has not been taking oxybutynin. Reinforced with pt to take oxybutynin and if it doesn't work can give samples of myrbetriq. Pt voiced understanding.

## 2017-10-25 DIAGNOSIS — H353211 Exudative age-related macular degeneration, right eye, with active choroidal neovascularization: Secondary | ICD-10-CM | POA: Diagnosis not present

## 2017-10-26 ENCOUNTER — Other Ambulatory Visit: Payer: Self-pay

## 2017-10-28 DIAGNOSIS — H2512 Age-related nuclear cataract, left eye: Secondary | ICD-10-CM | POA: Diagnosis not present

## 2017-10-31 ENCOUNTER — Telehealth: Payer: Self-pay | Admitting: Urology

## 2017-10-31 NOTE — Telephone Encounter (Signed)
[facility] did emergency surgery 2 weeks ago and pt is having blood in urine that started Friday.  Please give pt a call [phone]

## 2017-10-31 NOTE — Telephone Encounter (Signed)
Spoke with pt in reference to gross hematuria. Reinforced with pt bleeding is common with stents and to increase fluid in take. Also reinforced with pt to stop oxybutynin and start myrbetriq to help with bladder spasms. Pt voiced understanding. Samples placed up front.

## 2017-11-07 ENCOUNTER — Encounter: Payer: Self-pay | Admitting: Urology

## 2017-11-07 ENCOUNTER — Ambulatory Visit (INDEPENDENT_AMBULATORY_CARE_PROVIDER_SITE_OTHER): Payer: PPO | Admitting: Urology

## 2017-11-07 VITALS — BP 127/82 | HR 89 | Ht 68.0 in | Wt 205.0 lb

## 2017-11-07 DIAGNOSIS — N9971 Accidental puncture and laceration of a genitourinary system organ or structure during a genitourinary system procedure: Secondary | ICD-10-CM

## 2017-11-07 DIAGNOSIS — N9972 Accidental puncture and laceration of a genitourinary system organ or structure during other procedure: Secondary | ICD-10-CM

## 2017-11-07 DIAGNOSIS — N200 Calculus of kidney: Secondary | ICD-10-CM | POA: Diagnosis not present

## 2017-11-07 LAB — MICROSCOPIC EXAMINATION
EPITHELIAL CELLS (NON RENAL): NONE SEEN /HPF (ref 0–10)
RBC, UA: >30 /hpf — ABNORMAL HIGH (ref 0–?)

## 2017-11-07 LAB — URINALYSIS, COMPLETE
Bilirubin, UA: NEGATIVE
Glucose, UA: NEGATIVE
Ketones, UA: NEGATIVE
Nitrite, UA: NEGATIVE
PH UA: 5.5 (ref 5.0–7.5)
SPEC GRAV UA: 1.025 (ref 1.005–1.030)
Urobilinogen, Ur: 0.2 mg/dL (ref 0.2–1.0)

## 2017-11-08 ENCOUNTER — Encounter: Payer: Self-pay | Admitting: Urology

## 2017-11-08 NOTE — Progress Notes (Signed)
Indications: Patient is 84 y.o.,  who underwent bilateral ureteroscopy in [place] on 10/14/2017.  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  He underwent replacement of his ureteral stent.  He presents today for stent removal and has no complaints.   Procedure:  Flexible Cystoscopy with stent removal (21224)  Timeout was performed and the correct patient, procedure and participants were identified.    Description:  The patient was prepped and draped in the usual sterile fashion. Flexible cystosopy was performed.  The stent was visualized, grasped, and removed intact without difficulty. The patient tolerated the procedure well.  A single dose of oral antibiotics was given.  Complications:  None  Plan: He was instructed to call for development of flank pain.  Follow-up renal ultrasound in 6 weeks.

## 2017-11-09 DIAGNOSIS — N200 Calculus of kidney: Secondary | ICD-10-CM | POA: Diagnosis not present

## 2017-11-16 ENCOUNTER — Other Ambulatory Visit: Payer: Self-pay | Admitting: Family Medicine

## 2017-11-16 DIAGNOSIS — R229 Localized swelling, mass and lump, unspecified: Principal | ICD-10-CM

## 2017-11-16 DIAGNOSIS — IMO0002 Reserved for concepts with insufficient information to code with codable children: Secondary | ICD-10-CM

## 2017-11-21 ENCOUNTER — Ambulatory Visit
Admission: RE | Admit: 2017-11-21 | Discharge: 2017-11-21 | Disposition: A | Discharge status: Home / Self Care | Payer: PPO | Source: Ambulatory Visit | Attending: Family Medicine | Admitting: Family Medicine

## 2017-11-21 DIAGNOSIS — I712 Thoracic aortic aneurysm, without rupture: Secondary | ICD-10-CM | POA: Insufficient documentation

## 2017-11-21 DIAGNOSIS — K449 Diaphragmatic hernia without obstruction or gangrene: Secondary | ICD-10-CM | POA: Insufficient documentation

## 2017-11-21 DIAGNOSIS — I700 Atherosclerosis of aorta: Secondary | ICD-10-CM | POA: Insufficient documentation

## 2017-11-21 DIAGNOSIS — IMO0002 Reserved for concepts with insufficient information to code with codable children: Secondary | ICD-10-CM

## 2017-11-21 DIAGNOSIS — I2510 Atherosclerotic heart disease of native coronary artery without angina pectoris: Secondary | ICD-10-CM | POA: Diagnosis not present

## 2017-11-21 DIAGNOSIS — R229 Localized swelling, mass and lump, unspecified: Secondary | ICD-10-CM | POA: Diagnosis present

## 2017-11-21 LAB — POCT I-STAT CREATININE: CREATININE: 1.2 mg/dL (ref 0.61–1.24)

## 2017-11-21 MED ORDER — IOPAMIDOL (ISOVUE-300) INJECTION 61%
75.0000 mL | Freq: Once | INTRAVENOUS | Status: AC | PRN
  Administered 2017-11-21: 75 mL via INTRAVENOUS

## 2017-11-28 ENCOUNTER — Telehealth: Payer: Self-pay | Admitting: Urology

## 2017-11-28 NOTE — Telephone Encounter (Signed)
Scheduling called to schedule his RUS but patient said he didn't need one and didn't want to make an appointment. No follow up with you either.  Please advise   Sharyn Lull

## 2017-11-29 NOTE — Telephone Encounter (Signed)
He is at risk for developing scar tissue in his ureter which if develops can cause long-term kidney damage.  I recommended the renal ultrasound to make sure there is no evidence this is happening.

## 2017-12-01 NOTE — Telephone Encounter (Signed)
Spoke with pt in reference to why he would need the f/u RUS. Pt voiced frustration. Discussed with pt risk of not having RUS. Pt voiced understanding. Number to scheduling was provided to pt.

## 2017-12-06 ENCOUNTER — Ambulatory Visit
Admission: RE | Admit: 2017-12-06 | Discharge: 2017-12-06 | Disposition: A | Discharge status: Home / Self Care | Payer: PPO | Source: Ambulatory Visit | Attending: Urology | Admitting: Urology

## 2017-12-06 DIAGNOSIS — N9971 Accidental puncture and laceration of a genitourinary system organ or structure during a genitourinary system procedure: Secondary | ICD-10-CM

## 2017-12-06 DIAGNOSIS — N400 Enlarged prostate without lower urinary tract symptoms: Secondary | ICD-10-CM | POA: Diagnosis not present

## 2017-12-06 DIAGNOSIS — N200 Calculus of kidney: Secondary | ICD-10-CM | POA: Diagnosis not present

## 2017-12-06 DIAGNOSIS — N9972 Accidental puncture and laceration of a genitourinary system organ or structure during other procedure: Secondary | ICD-10-CM | POA: Diagnosis not present

## 2017-12-12 ENCOUNTER — Telehealth: Payer: Self-pay

## 2017-12-12 NOTE — Telephone Encounter (Signed)
-----   Message from Abbie Sons, MD sent at 12/09/2017  1:05 PM EDT ----- Renal ultrasound showed no evidence of scar tissue or kidney blockage

## 2017-12-12 NOTE — Telephone Encounter (Signed)
Left pt mess to call 

## 2017-12-12 NOTE — Telephone Encounter (Signed)
Patient notified

## 2017-12-14 DIAGNOSIS — H2512 Age-related nuclear cataract, left eye: Secondary | ICD-10-CM | POA: Diagnosis not present

## 2017-12-21 ENCOUNTER — Ambulatory Visit: Payer: PPO | Admitting: Urology

## 2017-12-27 DIAGNOSIS — H2512 Age-related nuclear cataract, left eye: Secondary | ICD-10-CM | POA: Diagnosis not present

## 2017-12-29 ENCOUNTER — Encounter: Payer: Self-pay | Admitting: *Deleted

## 2018-01-03 DIAGNOSIS — H353211 Exudative age-related macular degeneration, right eye, with active choroidal neovascularization: Secondary | ICD-10-CM | POA: Diagnosis not present

## 2018-01-03 DIAGNOSIS — H353122 Nonexudative age-related macular degeneration, left eye, intermediate dry stage: Secondary | ICD-10-CM | POA: Diagnosis not present

## 2018-01-04 ENCOUNTER — Encounter: Payer: Self-pay | Admitting: Urology

## 2018-01-10 ENCOUNTER — Ambulatory Visit: Payer: PPO | Admitting: Anesthesiology

## 2018-01-10 ENCOUNTER — Ambulatory Visit
Admission: RE | Admit: 2018-01-10 | Discharge: 2018-01-10 | Disposition: A | Discharge status: Home / Self Care | Payer: PPO | Source: Ambulatory Visit | Attending: Ophthalmology | Admitting: Ophthalmology

## 2018-01-10 ENCOUNTER — Encounter
Admission: RE | Disposition: A | Discharge status: Home / Self Care | Payer: Self-pay | Source: Ambulatory Visit | Attending: Ophthalmology

## 2018-01-10 DIAGNOSIS — I10 Essential (primary) hypertension: Secondary | ICD-10-CM | POA: Insufficient documentation

## 2018-01-10 DIAGNOSIS — H2512 Age-related nuclear cataract, left eye: Secondary | ICD-10-CM | POA: Diagnosis not present

## 2018-01-10 DIAGNOSIS — K219 Gastro-esophageal reflux disease without esophagitis: Secondary | ICD-10-CM | POA: Diagnosis not present

## 2018-01-10 DIAGNOSIS — Z79899 Other long term (current) drug therapy: Secondary | ICD-10-CM | POA: Diagnosis not present

## 2018-01-10 DIAGNOSIS — Z87442 Personal history of urinary calculi: Secondary | ICD-10-CM | POA: Diagnosis not present

## 2018-01-10 DIAGNOSIS — E785 Hyperlipidemia, unspecified: Secondary | ICD-10-CM | POA: Diagnosis not present

## 2018-01-10 HISTORY — PX: CATARACT EXTRACTION W/PHACO: SHX586

## 2018-01-10 HISTORY — DX: Personal history of urinary calculi: Z87.442

## 2018-01-10 HISTORY — DX: Gastro-esophageal reflux disease without esophagitis: K21.9

## 2018-01-10 SURGERY — PHACOEMULSIFICATION, CATARACT, WITH IOL INSERTION
Anesthesia: Monitor Anesthesia Care | Site: Eye | Laterality: Left | Wound class: Clean

## 2018-01-10 MED ORDER — MIDAZOLAM HCL 2 MG/2ML IJ SOLN
INTRAMUSCULAR | Status: DC | PRN
  Administered 2018-01-10 (×2): 1 mg via INTRAVENOUS

## 2018-01-10 MED ORDER — MOXIFLOXACIN HCL 0.5 % OP SOLN
OPHTHALMIC | Status: AC
  Filled 2018-01-10: qty 3

## 2018-01-10 MED ORDER — POVIDONE-IODINE 5 % OP SOLN
OPHTHALMIC | Status: DC | PRN
  Administered 2018-01-10: 1 via OPHTHALMIC

## 2018-01-10 MED ORDER — EPINEPHRINE PF 1 MG/ML IJ SOLN
INTRAOCULAR | Status: DC | PRN
  Administered 2018-01-10: 12:00:00 via OPHTHALMIC

## 2018-01-10 MED ORDER — LIDOCAINE HCL (PF) 4 % IJ SOLN
INTRAOCULAR | Status: DC | PRN
  Administered 2018-01-10: 4 mL via OPHTHALMIC

## 2018-01-10 MED ORDER — MOXIFLOXACIN HCL 0.5 % OP SOLN
OPHTHALMIC | Status: DC | PRN
Start: 2018-01-10 — End: 2018-01-10
  Administered 2018-01-10: 0.2 mL via OPHTHALMIC

## 2018-01-10 MED ORDER — LIDOCAINE HCL (PF) 4 % IJ SOLN
INTRAMUSCULAR | Status: AC
  Filled 2018-01-10: qty 5

## 2018-01-10 MED ORDER — MOXIFLOXACIN HCL 0.5 % OP SOLN: 1.0000 [drp] | OPHTHALMIC | Status: DC | PRN

## 2018-01-10 MED ORDER — NA CHONDROIT SULF-NA HYALURON 40-17 MG/ML IO SOLN
INTRAOCULAR | Status: AC
  Filled 2018-01-10: qty 1

## 2018-01-10 MED ORDER — CARBACHOL 0.01 % IO SOLN
INTRAOCULAR | Status: DC | PRN
  Administered 2018-01-10: 0.5 mL via INTRAOCULAR

## 2018-01-10 MED ORDER — SODIUM CHLORIDE 0.9 % IV SOLN
INTRAVENOUS | Status: DC
  Administered 2018-01-10: 10:00:00 via INTRAVENOUS

## 2018-01-10 MED ORDER — EPINEPHRINE PF 1 MG/ML IJ SOLN
INTRAMUSCULAR | Status: AC
  Filled 2018-01-10: qty 2

## 2018-01-10 MED ORDER — ARMC OPHTHALMIC DILATING DROPS
1.0000 "application " | OPHTHALMIC | Status: AC
  Administered 2018-01-10 (×3): 1 via OPHTHALMIC

## 2018-01-10 MED ORDER — ARMC OPHTHALMIC DILATING DROPS
OPHTHALMIC | Status: AC
  Administered 2018-01-10: 1 via OPHTHALMIC
  Filled 2018-01-10: qty 0.4

## 2018-01-10 MED ORDER — NA CHONDROIT SULF-NA HYALURON 40-17 MG/ML IO SOLN
INTRAOCULAR | Status: DC | PRN
  Administered 2018-01-10: 1 mL via INTRAOCULAR

## 2018-01-10 MED ORDER — MIDAZOLAM HCL 2 MG/2ML IJ SOLN
INTRAMUSCULAR | Status: AC
  Filled 2018-01-10: qty 2

## 2018-01-10 MED ORDER — POVIDONE-IODINE 5 % OP SOLN
OPHTHALMIC | Status: AC
  Filled 2018-01-10: qty 30

## 2018-01-10 SURGICAL SUPPLY — 16 items
GLOVE BIO SURGEON STRL SZ8 (GLOVE) ×3 IMPLANT
GLOVE BIOGEL M 6.5 STRL (GLOVE) ×3 IMPLANT
GLOVE SURG LX 8.0 MICRO (GLOVE) ×2
GLOVE SURG LX STRL 8.0 MICRO (GLOVE) ×1 IMPLANT
GOWN STRL REUS W/ TWL LRG LVL3 (GOWN DISPOSABLE) ×2 IMPLANT
GOWN STRL REUS W/TWL LRG LVL3 (GOWN DISPOSABLE) ×6
LABEL CATARACT MEDS ST (LABEL) ×3 IMPLANT
LENS IOL TECNIS ITEC 23.0 (Intraocular Lens) ×2 IMPLANT
PACK CATARACT (MISCELLANEOUS) ×3 IMPLANT
PACK CATARACT BRASINGTON LX (MISCELLANEOUS) ×3 IMPLANT
PACK EYE AFTER SURG (MISCELLANEOUS) ×3 IMPLANT
SOL BSS BAG (MISCELLANEOUS) ×3
SOLUTION BSS BAG (MISCELLANEOUS) ×1 IMPLANT
SYR 5ML LL (SYRINGE) ×3 IMPLANT
WATER STERILE IRR 250ML POUR (IV SOLUTION) ×3 IMPLANT
WIPE NON LINTING 3.25X3.25 (MISCELLANEOUS) ×3 IMPLANT

## 2018-01-10 NOTE — Anesthesia Preprocedure Evaluation (Addendum)
Anesthesia Evaluation  Patient identified by MRN, date of birth, ID band Patient awake    Reviewed: Allergy & Precautions, H&P , NPO status , reviewed documented beta blocker date and time   History of Anesthesia Complications Negative for: history of anesthetic complications  Airway Mallampati: II  TM Distance: >3 FB Neck ROM: full    Dental  (+) Teeth Intact   Pulmonary former smoker,    Pulmonary exam normal        Cardiovascular Exercise Tolerance: Good hypertension, On Medications Normal cardiovascular exam     Neuro/Psych negative neurological ROS  negative psych ROS   GI/Hepatic GERD  Medicated and Controlled,  Endo/Other    Renal/GU Renal diseasenegative Renal ROS  negative genitourinary   Musculoskeletal   Abdominal   Peds  Hematology negative hematology ROS (+)   Anesthesia Other Findings Had cysto/LMA - Feb 2019  Reproductive/Obstetrics                          Anesthesia Physical Anesthesia Plan  ASA: III  Anesthesia Plan: MAC   Post-op Pain Management:    Induction:   PONV Risk Score and Plan: TIVA  Airway Management Planned:   Additional Equipment:   Intra-op Plan:   Post-operative Plan:   Informed Consent: I have reviewed the patients History and Physical, chart, labs and discussed the procedure including the risks, benefits and alternatives for the proposed anesthesia with the patient or authorized representative who has indicated his/her understanding and acceptance.   Dental Advisory Given  Plan Discussed with:   Anesthesia Plan Comments:        Anesthesia Quick Evaluation

## 2018-01-10 NOTE — Op Note (Signed)
PREOPERATIVE DIAGNOSIS:  Nuclear sclerotic cataract of the left eye.   POSTOPERATIVE DIAGNOSIS:  Nuclear sclerotic cataract of the left eye.   OPERATIVE PROCEDURE: Procedure(s): CATARACT EXTRACTION PHACO AND INTRAOCULAR LENS PLACEMENT (IOC)   SURGEON:  Birder Robson, MD.   ANESTHESIA:  Anesthesiologist: Alphonsus Sias, MD CRNA: Carron Curie, CRNA  1.      Managed anesthesia care. 2.     0.59ml of Shugarcaine was instilled following the paracentesis   COMPLICATIONS:  None.   TECHNIQUE:   Stop and chop   DESCRIPTION OF PROCEDURE:  The patient was examined and consented in the preoperative holding area where the aforementioned topical anesthesia was applied to the left eye and then brought back to the Operating Room where the left eye was prepped and draped in the usual sterile ophthalmic fashion and a lid speculum was placed. A paracentesis was created with the side port blade and the anterior chamber was filled with viscoelastic. A near clear corneal incision was performed with the steel keratome. A continuous curvilinear capsulorrhexis was performed with a cystotome followed by the capsulorrhexis forceps. Hydrodissection and hydrodelineation were carried out with BSS on a blunt cannula. The lens was removed in a stop and chop  technique and the remaining cortical material was removed with the irrigation-aspiration handpiece. The capsular bag was inflated with viscoelastic and the Technis [id number] lens was placed in the capsular bag without complication. The remaining viscoelastic was removed from the eye with the irrigation-aspiration handpiece. The wounds were hydrated. The anterior chamber was flushed with Miostat and the eye was inflated to physiologic pressure. 0.57ml Vigamox was placed in the anterior chamber. The wounds were found to be water tight. The eye was dressed with Vigamox. The patient was given protective glasses to wear throughout the day and a shield with which to sleep  tonight. The patient was also given drops with which to begin a drop regimen today and will follow-up with me in one day. Implant Name Type Inv. Item Serial No. Manufacturer Lot No. LRB No. Used  LENS IOL DIOP 23.0 - [id number] Intraocular Lens LENS IOL DIOP 23.0 [id number]  Left 1    Procedure(s) with comments: CATARACT EXTRACTION PHACO AND INTRAOCULAR LENS PLACEMENT (IOC) (Left) - [name] 00:24.8 AP% 14.9 CDE 3.68 Fluid pack lot # [id number]  Electronically signed: Birder Robson 01/10/2018 11:59 AM

## 2018-01-10 NOTE — Transfer of Care (Signed)
Immediate Anesthesia Transfer of Care Note  Patient: Jack [name]  Procedure(s) Performed: CATARACT EXTRACTION PHACO AND INTRAOCULAR LENS PLACEMENT (IOC) (Left Eye)  Patient Location: PACU  Anesthesia Type:MAC  Level of Consciousness: awake  Airway & Oxygen Therapy: Patient Spontanous Breathing  Post-op Assessment: Report given to RN  Post vital signs: stable  Last Vitals:  Vitals Value Taken Time  BP    Temp    Pulse    Resp    SpO2      Last Pain:  Vitals:   01/10/18 1025  TempSrc: Oral         Complications: No apparent anesthesia complications

## 2018-01-10 NOTE — H&P (Signed)
All labs reviewed. Abnormal studies sent to patients PCP when indicated.  Previous H&P reviewed, patient examined, there are NO CHANGES.  Jack Menta Porfilio4/30/201911:33 AM

## 2018-01-10 NOTE — Anesthesia Post-op Follow-up Note (Signed)
Anesthesia QCDR form completed.        

## 2018-01-10 NOTE — Discharge Instructions (Signed)
Follow Dr. Inda Coke postop discharge instruction sheet as reviewed.  Eye Surgery Discharge Instructions  Expect mild scratchy sensation or mild soreness. DO NOT RUB YOUR EYE!  The day of surgery:  Minimal physical activity, but bed rest is not required  No reading, computer work, or close hand work  No bending, lifting, or straining.  May watch TV  For 24 hours:  No driving, legal decisions, or alcoholic beverages  Safety precautions  Eat anything you prefer: It is better to start with liquids, then soup then solid foods.  _____ Eye patch should be worn until postoperative exam tomorrow.  ____ Solar shield eyeglasses should be worn for comfort in the sunlight/patch while sleeping  Resume all regular medications including aspirin or Coumadin if these were discontinued prior to surgery. You may shower, bathe, shave, or wash your hair. Tylenol may be taken for mild discomfort.  Call your doctor if you experience significant pain, nausea, or vomiting, fever > 101 or other signs of infection. [phone] or [phone] Specific instructions:  Follow-up Information    Birder Robson, MD Follow up.   Specialty:  Ophthalmology Why:  Wed 01/11/18 @ 9:00 am Contact information: [address] [place] [address] [phone]

## 2018-01-10 NOTE — Anesthesia Postprocedure Evaluation (Signed)
Anesthesia Post Note  Patient: Jack [name]  Procedure(s) Performed: CATARACT EXTRACTION PHACO AND INTRAOCULAR LENS PLACEMENT (IOC) (Left Eye)  Patient location during evaluation: PACU Anesthesia Type: MAC Level of consciousness: awake Pain management: pain level controlled Vital Signs Assessment: post-procedure vital signs reviewed and stable Respiratory status: spontaneous breathing Cardiovascular status: blood pressure returned to baseline Postop Assessment: no apparent nausea or vomiting Anesthetic complications: no     Last Vitals:  Vitals:   01/10/18 1025  BP: (!) 149/101  Resp: 18  Temp: 36.5 C  SpO2: 97%    Last Pain:  Vitals:   01/10/18 1025  TempSrc: Oral                 Carron Curie

## 2018-01-19 DIAGNOSIS — H2511 Age-related nuclear cataract, right eye: Secondary | ICD-10-CM | POA: Diagnosis not present

## 2018-01-23 ENCOUNTER — Encounter: Payer: Self-pay | Admitting: *Deleted

## 2018-01-25 ENCOUNTER — Ambulatory Visit
Admission: RE | Admit: 2018-01-25 | Discharge: 2018-01-25 | Disposition: A | Discharge status: Home / Self Care | Payer: PPO | Source: Ambulatory Visit | Attending: Ophthalmology | Admitting: Ophthalmology

## 2018-01-25 ENCOUNTER — Ambulatory Visit: Payer: PPO | Admitting: Certified Registered Nurse Anesthetist

## 2018-01-25 ENCOUNTER — Encounter
Admission: RE | Disposition: A | Discharge status: Home / Self Care | Payer: Self-pay | Source: Ambulatory Visit | Attending: Ophthalmology

## 2018-01-25 ENCOUNTER — Other Ambulatory Visit: Payer: Self-pay

## 2018-01-25 DIAGNOSIS — Z87891 Personal history of nicotine dependence: Secondary | ICD-10-CM | POA: Diagnosis not present

## 2018-01-25 DIAGNOSIS — E785 Hyperlipidemia, unspecified: Secondary | ICD-10-CM | POA: Insufficient documentation

## 2018-01-25 DIAGNOSIS — I10 Essential (primary) hypertension: Secondary | ICD-10-CM | POA: Insufficient documentation

## 2018-01-25 DIAGNOSIS — Z9842 Cataract extraction status, left eye: Secondary | ICD-10-CM | POA: Insufficient documentation

## 2018-01-25 DIAGNOSIS — Z79899 Other long term (current) drug therapy: Secondary | ICD-10-CM | POA: Diagnosis not present

## 2018-01-25 DIAGNOSIS — Z961 Presence of intraocular lens: Secondary | ICD-10-CM | POA: Insufficient documentation

## 2018-01-25 DIAGNOSIS — K219 Gastro-esophageal reflux disease without esophagitis: Secondary | ICD-10-CM | POA: Diagnosis not present

## 2018-01-25 DIAGNOSIS — Z86010 Personal history of colonic polyps: Secondary | ICD-10-CM | POA: Insufficient documentation

## 2018-01-25 DIAGNOSIS — H2511 Age-related nuclear cataract, right eye: Secondary | ICD-10-CM | POA: Insufficient documentation

## 2018-01-25 HISTORY — PX: CATARACT EXTRACTION W/PHACO: SHX586

## 2018-01-25 SURGERY — PHACOEMULSIFICATION, CATARACT, WITH IOL INSERTION
Anesthesia: Monitor Anesthesia Care | Site: Eye | Laterality: Right | Wound class: Clean

## 2018-01-25 MED ORDER — FENTANYL CITRATE (PF) 100 MCG/2ML IJ SOLN
INTRAMUSCULAR | Status: DC | PRN
  Administered 2018-01-25 (×2): 25 ug via INTRAVENOUS
  Administered 2018-01-25: 50 ug via INTRAVENOUS

## 2018-01-25 MED ORDER — EPINEPHRINE PF 1 MG/ML IJ SOLN
INTRAMUSCULAR | Status: AC
  Filled 2018-01-25: qty 1

## 2018-01-25 MED ORDER — MOXIFLOXACIN HCL 0.5 % OP SOLN
1.0000 [drp] | OPHTHALMIC | Status: DC | PRN
Start: 2018-01-25 — End: 2018-01-25

## 2018-01-25 MED ORDER — LIDOCAINE HCL (PF) 4 % IJ SOLN
INTRAMUSCULAR | Status: AC
  Filled 2018-01-25: qty 5

## 2018-01-25 MED ORDER — SODIUM CHLORIDE 0.9 % IV SOLN
INTRAVENOUS | Status: DC
  Administered 2018-01-25: 08:00:00 via INTRAVENOUS

## 2018-01-25 MED ORDER — MOXIFLOXACIN HCL 0.5 % OP SOLN
OPHTHALMIC | Status: AC
  Filled 2018-01-25: qty 3

## 2018-01-25 MED ORDER — ARMC OPHTHALMIC DILATING DROPS
1.0000 "application " | OPHTHALMIC | Status: AC
  Administered 2018-01-25 (×3): 1 via OPHTHALMIC

## 2018-01-25 MED ORDER — POVIDONE-IODINE 5 % OP SOLN
OPHTHALMIC | Status: DC | PRN
  Administered 2018-01-25: 1 via OPHTHALMIC

## 2018-01-25 MED ORDER — NA CHONDROIT SULF-NA HYALURON 40-17 MG/ML IO SOLN
INTRAOCULAR | Status: DC | PRN
  Administered 2018-01-25: 1 mL via INTRAOCULAR

## 2018-01-25 MED ORDER — LIDOCAINE HCL (PF) 4 % IJ SOLN
INTRAOCULAR | Status: DC | PRN
  Administered 2018-01-25: 2 mL via OPHTHALMIC

## 2018-01-25 MED ORDER — POVIDONE-IODINE 5 % OP SOLN
OPHTHALMIC | Status: AC
  Filled 2018-01-25: qty 30

## 2018-01-25 MED ORDER — ARMC OPHTHALMIC DILATING DROPS
OPHTHALMIC | Status: AC
  Administered 2018-01-25: 1 via OPHTHALMIC
  Filled 2018-01-25: qty 0.4

## 2018-01-25 MED ORDER — NA CHONDROIT SULF-NA HYALURON 40-17 MG/ML IO SOLN
INTRAOCULAR | Status: AC
  Filled 2018-01-25: qty 1

## 2018-01-25 MED ORDER — CARBACHOL 0.01 % IO SOLN
INTRAOCULAR | Status: DC | PRN
  Administered 2018-01-25: .5 mL via INTRAOCULAR

## 2018-01-25 MED ORDER — FENTANYL CITRATE (PF) 100 MCG/2ML IJ SOLN
INTRAMUSCULAR | Status: AC
  Filled 2018-01-25: qty 2

## 2018-01-25 MED ORDER — MOXIFLOXACIN HCL 0.5 % OP SOLN
OPHTHALMIC | Status: DC | PRN
  Administered 2018-01-25: .2 mL via OPHTHALMIC

## 2018-01-25 MED ORDER — EPINEPHRINE PF 1 MG/ML IJ SOLN
INTRAOCULAR | Status: DC | PRN
  Administered 2018-01-25: 1 mL via OPHTHALMIC

## 2018-01-25 SURGICAL SUPPLY — 16 items
GLOVE BIO SURGEON STRL SZ8 (GLOVE) ×3 IMPLANT
GLOVE BIOGEL M 6.5 STRL (GLOVE) ×3 IMPLANT
GLOVE SURG LX 8.0 MICRO (GLOVE) ×2
GLOVE SURG LX STRL 8.0 MICRO (GLOVE) ×1 IMPLANT
GOWN STRL REUS W/ TWL LRG LVL3 (GOWN DISPOSABLE) ×2 IMPLANT
GOWN STRL REUS W/TWL LRG LVL3 (GOWN DISPOSABLE) ×6
LABEL CATARACT MEDS ST (LABEL) ×3 IMPLANT
LENS IOL TECNIS ITEC 22.5 (Intraocular Lens) ×2 IMPLANT
PACK CATARACT (MISCELLANEOUS) ×3 IMPLANT
PACK CATARACT BRASINGTON LX (MISCELLANEOUS) ×3 IMPLANT
PACK EYE AFTER SURG (MISCELLANEOUS) ×3 IMPLANT
SOL BSS BAG (MISCELLANEOUS) ×3
SOLUTION BSS BAG (MISCELLANEOUS) ×1 IMPLANT
SYR 5ML LL (SYRINGE) ×3 IMPLANT
WATER STERILE IRR 250ML POUR (IV SOLUTION) ×3 IMPLANT
WIPE NON LINTING 3.25X3.25 (MISCELLANEOUS) ×3 IMPLANT

## 2018-01-25 NOTE — Anesthesia Preprocedure Evaluation (Signed)
Anesthesia Evaluation  Patient identified by MRN, date of birth, ID band Patient awake    Reviewed: Allergy & Precautions, H&P , NPO status , reviewed documented beta blocker date and time   Airway Mallampati: II  TM Distance: >3 FB     Dental  (+) Teeth Intact   Pulmonary former smoker,    Pulmonary exam normal        Cardiovascular hypertension, Normal cardiovascular exam     Neuro/Psych    GI/Hepatic GERD  Medicated and Controlled,  Endo/Other    Renal/GU Renal disease     Musculoskeletal   Abdominal   Peds  Hematology   Anesthesia Other Findings Past Medical History: No date: Elevated prostate specific antigen (PSA)     Comment:  has been 7 for a year  No date: GERD (gastroesophageal reflux disease) 2008: History of colon polyps     Comment:  [facility],  No date: History of kidney stones No date: Hyperlipidemia No date: Hypertension No date: Prostate hypertrophy     Comment:  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  Past Surgical History: 01/10/2018: CATARACT EXTRACTION W/PHACO; Left     Comment:  Procedure: CATARACT EXTRACTION PHACO AND INTRAOCULAR               LENS PLACEMENT (IOC);  Surgeon: Birder Robson, MD;                Location: [facility] ORS;  Service: Ophthalmology;  Laterality:              Left;  [name] 00:24.8 AP% 14.9 CDE 3.68 Fluid pack lot #               [id number] No date: COLON SURGERY 10/16/2017: CYSTOSCOPY W/ URETERAL STENT PLACEMENT; Right     Comment:  Procedure: right  URETERAL STENT PLACEMENT,cystoscopy               bilateral stent removal,rretrograde;  Surgeon: Abbie Sons, MD;  Location: [facility] ORS;  Service: Urology;                Laterality: Right; No date: KIDNEY STONE SURGERY jan 2009: RESECTION SOFT TISSUE TUMOR LEG / ANKLE RADICAL     Comment:  [facility],  right thigh/knee , nonmalignant 1946: SMALL INTESTINE SURGERY     Comment:  implaed on picket  fence, punctured stomach No date: TONSILLECTOMY     Reproductive/Obstetrics                             Anesthesia Physical Anesthesia Plan  ASA: III  Anesthesia Plan: MAC   Post-op Pain Management:    Induction:   PONV Risk Score and Plan: 1 and TIVA  Airway Management Planned:   Additional Equipment:   Intra-op Plan:   Post-operative Plan:   Informed Consent: I have reviewed the patients History and Physical, chart, labs and discussed the procedure including the risks, benefits and alternatives for the proposed anesthesia with the patient or authorized representative who has indicated his/her understanding and acceptance.   Dental Advisory Given  Plan Discussed with: CRNA  Anesthesia Plan Comments:         Anesthesia Quick Evaluation

## 2018-01-25 NOTE — Transfer of Care (Signed)
Immediate Anesthesia Transfer of Care Note  Patient: Jack [name]  Procedure(s) Performed: CATARACT EXTRACTION PHACO AND INTRAOCULAR LENS PLACEMENT (IOC) (Right Eye)  Patient Location: Short Stay  Anesthesia Type:MAC  Level of Consciousness: awake, alert , oriented and patient cooperative  Airway & Oxygen Therapy: Patient Spontanous Breathing  Post-op Assessment: Report given to RN and Post -op Vital signs reviewed and stable  Post vital signs: Reviewed and stable  Last Vitals:  Vitals Value Taken Time  BP    Temp 36.6 C 01/25/2018  9:04 AM  Pulse 58 01/25/2018  9:04 AM  Resp 16 01/25/2018  9:04 AM  SpO2 96 % 01/25/2018  9:04 AM    Last Pain:  Vitals:   01/25/18 0904  TempSrc: Oral  PainSc: 0-No pain         Complications: No apparent anesthesia complications

## 2018-01-25 NOTE — Anesthesia Post-op Follow-up Note (Signed)
Anesthesia QCDR form completed.        

## 2018-01-25 NOTE — Anesthesia Postprocedure Evaluation (Signed)
Anesthesia Post Note  Patient: Jack [name]  Procedure(s) Performed: CATARACT EXTRACTION PHACO AND INTRAOCULAR LENS PLACEMENT (IOC) (Right Eye)  Patient location during evaluation: Other Anesthesia Type: MAC Level of consciousness: awake and alert Pain management: pain level controlled Vital Signs Assessment: post-procedure vital signs reviewed and stable Respiratory status: spontaneous breathing, nonlabored ventilation and respiratory function stable Cardiovascular status: blood pressure returned to baseline and stable Postop Assessment: no apparent nausea or vomiting Anesthetic complications: no     Last Vitals:  Vitals:   01/25/18 0904 01/25/18 0911  BP: (!) 152/87 (!) 142/78  Pulse: (!) 58 67  Resp: 16 18  Temp: 36.6 C   SpO2: 96% 95%    Last Pain:  Vitals:   01/25/18 0911  TempSrc:   PainSc: 0-No pain                 Alphonsus Sias

## 2018-01-25 NOTE — H&P (Signed)
All labs reviewed. Abnormal studies sent to patients PCP when indicated.  Previous H&P reviewed, patient examined, there are NO CHANGES.  Jack Hild Porfilio5/15/20198:42 AM

## 2018-01-25 NOTE — Discharge Instructions (Addendum)
FOLLOW DR. PORFILIO'S POSTOP EYE DROP INSTRUCTION SHEET AS REVIEWED.  Eye Surgery Discharge Instructions  Expect mild scratchy sensation or mild soreness. DO NOT RUB YOUR EYE!  The day of surgery:  Minimal physical activity, but bed rest is not required  No reading, computer work, or close hand work  No bending, lifting, or straining.  May watch TV  For 24 hours:  No driving, legal decisions, or alcoholic beverages  Safety precautions  Eat anything you prefer: It is better to start with liquids, then soup then solid foods.  Solar shield eyeglasses should be worn for comfort in the sunlight/patch while sleeping  Resume all regular medications including aspirin or Coumadin if these were discontinued prior to surgery. You may shower, bathe, shave, or wash your hair. Tylenol may be taken for mild discomfort.  Call your doctor if you experience significant pain, nausea, or vomiting, fever > 101 or other signs of infection. [phone] or [phone] Specific instructions:  Follow-up Information    Birder Robson, MD Follow up.   Specialty:  Ophthalmology Why:  01/26/18 @ 8:20 am Contact information: [place] [address] [phone]

## 2018-01-25 NOTE — Op Note (Signed)
PREOPERATIVE DIAGNOSIS:  Nuclear sclerotic cataract of the right eye.   POSTOPERATIVE DIAGNOSIS:  nuclear sclerotic catatarct right eye   OPERATIVE PROCEDURE: Procedure(s): CATARACT EXTRACTION PHACO AND INTRAOCULAR LENS PLACEMENT (IOC)   SURGEON:  Birder Robson, MD.   ANESTHESIA:  Anesthesiologist: Alphonsus Sias, MD CRNA: Eben Burow, CRNA  1.      Managed anesthesia care. 2.      0.64ml of Shugarcaine was instilled in the eye following the paracentesis.   COMPLICATIONS:  None.   TECHNIQUE:   Stop and chop   DESCRIPTION OF PROCEDURE:  The patient was examined and consented in the preoperative holding area where the aforementioned topical anesthesia was applied to the right eye and then brought back to the Operating Room where the right eye was prepped and draped in the usual sterile ophthalmic fashion and a lid speculum was placed. A paracentesis was created with the side port blade and the anterior chamber was filled with viscoelastic. A near clear corneal incision was performed with the steel keratome. A continuous curvilinear capsulorrhexis was performed with a cystotome followed by the capsulorrhexis forceps. Hydrodissection and hydrodelineation were carried out with BSS on a blunt cannula. The lens was removed in a stop and chop  technique and the remaining cortical material was removed with the irrigation-aspiration handpiece. The capsular bag was inflated with viscoelastic and the Technis ZCB00  lens was placed in the capsular bag without complication. The remaining viscoelastic was removed from the eye with the irrigation-aspiration handpiece. The wounds were hydrated. The anterior chamber was flushed with Miostat and the eye was inflated to physiologic pressure. 0.38ml of Vigamox was placed in the anterior chamber. The wounds were found to be water tight. The eye was dressed with Vigamox. The patient was given protective glasses to wear throughout the day and a shield with which  to sleep tonight. The patient was also given drops with which to begin a drop regimen today and will follow-up with me in one day. Implant Name Type Inv. Item Serial No. Manufacturer Lot No. LRB No. Used  LENS IOL DIOP 22.5 - [id number] Intraocular Lens LENS IOL DIOP 22.5 [id number] AMO  Right 1   Procedure(s) with comments: CATARACT EXTRACTION PHACO AND INTRAOCULAR LENS PLACEMENT (IOC) (Right) - [name] 00:42 AP% 10.8 CDE 4.59 Fluid pack lot # [id number]  Electronically signed: Birder Robson 01/25/2018 9:03 AM

## 2018-01-27 ENCOUNTER — Other Ambulatory Visit: Payer: Self-pay | Admitting: Family Medicine

## 2018-01-27 ENCOUNTER — Encounter: Payer: Self-pay | Admitting: Urology

## 2018-01-27 MED ORDER — MIRABEGRON ER 50 MG PO TB24: 50.0000 mg | ORAL_TABLET | Freq: Every day | ORAL | 11 refills | Status: DC

## 2018-01-31 ENCOUNTER — Other Ambulatory Visit: Payer: Self-pay | Admitting: Family Medicine

## 2018-01-31 DIAGNOSIS — I719 Aortic aneurysm of unspecified site, without rupture: Secondary | ICD-10-CM

## 2018-01-31 DIAGNOSIS — I77819 Aortic ectasia, unspecified site: Secondary | ICD-10-CM

## 2018-03-13 DIAGNOSIS — H353211 Exudative age-related macular degeneration, right eye, with active choroidal neovascularization: Secondary | ICD-10-CM | POA: Diagnosis not present

## 2018-03-20 ENCOUNTER — Ambulatory Visit (HOSPITAL_COMMUNITY): Payer: PPO

## 2018-03-20 ENCOUNTER — Encounter (HOSPITAL_COMMUNITY): Payer: Self-pay

## 2018-05-01 DIAGNOSIS — H35352 Cystoid macular degeneration, left eye: Secondary | ICD-10-CM | POA: Diagnosis not present

## 2018-05-16 DIAGNOSIS — H353211 Exudative age-related macular degeneration, right eye, with active choroidal neovascularization: Secondary | ICD-10-CM | POA: Diagnosis not present

## 2018-06-13 DIAGNOSIS — H35352 Cystoid macular degeneration, left eye: Secondary | ICD-10-CM | POA: Diagnosis not present

## 2018-07-31 DIAGNOSIS — H353211 Exudative age-related macular degeneration, right eye, with active choroidal neovascularization: Secondary | ICD-10-CM | POA: Diagnosis not present

## 2018-08-18 DIAGNOSIS — D2272 Melanocytic nevi of left lower limb, including hip: Secondary | ICD-10-CM | POA: Diagnosis not present

## 2018-08-18 DIAGNOSIS — L821 Other seborrheic keratosis: Secondary | ICD-10-CM | POA: Diagnosis not present

## 2018-08-18 DIAGNOSIS — D2261 Melanocytic nevi of right upper limb, including shoulder: Secondary | ICD-10-CM | POA: Diagnosis not present

## 2018-08-18 DIAGNOSIS — D2271 Melanocytic nevi of right lower limb, including hip: Secondary | ICD-10-CM | POA: Diagnosis not present

## 2018-08-18 DIAGNOSIS — L570 Actinic keratosis: Secondary | ICD-10-CM | POA: Diagnosis not present

## 2018-08-18 DIAGNOSIS — X32XXXA Exposure to sunlight, initial encounter: Secondary | ICD-10-CM | POA: Diagnosis not present

## 2018-08-18 DIAGNOSIS — Z08 Encounter for follow-up examination after completed treatment for malignant neoplasm: Secondary | ICD-10-CM | POA: Diagnosis not present

## 2018-08-18 DIAGNOSIS — D225 Melanocytic nevi of trunk: Secondary | ICD-10-CM | POA: Diagnosis not present

## 2018-08-18 DIAGNOSIS — Z85828 Personal history of other malignant neoplasm of skin: Secondary | ICD-10-CM | POA: Diagnosis not present

## 2018-08-18 DIAGNOSIS — D2262 Melanocytic nevi of left upper limb, including shoulder: Secondary | ICD-10-CM | POA: Diagnosis not present

## 2018-08-29 DIAGNOSIS — H35352 Cystoid macular degeneration, left eye: Secondary | ICD-10-CM | POA: Diagnosis not present

## 2018-09-26 DIAGNOSIS — H353211 Exudative age-related macular degeneration, right eye, with active choroidal neovascularization: Secondary | ICD-10-CM | POA: Diagnosis not present

## 2018-10-13 DIAGNOSIS — H353133 Nonexudative age-related macular degeneration, bilateral, advanced atrophic without subfoveal involvement: Secondary | ICD-10-CM | POA: Diagnosis not present

## 2018-10-13 DIAGNOSIS — H59033 Cystoid macular edema following cataract surgery, bilateral: Secondary | ICD-10-CM | POA: Diagnosis not present

## 2018-10-13 DIAGNOSIS — H43812 Vitreous degeneration, left eye: Secondary | ICD-10-CM | POA: Diagnosis not present

## 2019-01-22 ENCOUNTER — Telehealth: Payer: Self-pay | Admitting: Urology

## 2019-01-22 DIAGNOSIS — N9972 Accidental puncture and laceration of a genitourinary system organ or structure during other procedure: Secondary | ICD-10-CM

## 2019-01-22 DIAGNOSIS — N9971 Accidental puncture and laceration of a genitourinary system organ or structure during a genitourinary system procedure: Secondary | ICD-10-CM

## 2019-01-22 NOTE — Telephone Encounter (Signed)
Pt needs refill on Myrbetriq 50mg  at [facility] on [address].

## 2019-01-23 MED ORDER — MIRABEGRON ER 50 MG PO TB24: 50.0000 mg | ORAL_TABLET | Freq: Every day | ORAL | 0 refills | Status: DC

## 2019-01-23 NOTE — Addendum Note (Signed)
Addended by: Donalee Citrin on: 01/23/2019 02:15 PM   Modules accepted: Orders

## 2019-04-03 DIAGNOSIS — H43812 Vitreous degeneration, left eye: Secondary | ICD-10-CM | POA: Diagnosis not present

## 2019-04-03 DIAGNOSIS — H59033 Cystoid macular edema following cataract surgery, bilateral: Secondary | ICD-10-CM | POA: Diagnosis not present

## 2019-04-03 DIAGNOSIS — H353133 Nonexudative age-related macular degeneration, bilateral, advanced atrophic without subfoveal involvement: Secondary | ICD-10-CM | POA: Diagnosis not present

## 2019-04-18 ENCOUNTER — Telehealth: Payer: Self-pay

## 2019-04-18 DIAGNOSIS — N9972 Accidental puncture and laceration of a genitourinary system organ or structure during other procedure: Secondary | ICD-10-CM

## 2019-04-18 DIAGNOSIS — N9971 Accidental puncture and laceration of a genitourinary system organ or structure during a genitourinary system procedure: Secondary | ICD-10-CM

## 2019-04-18 MED ORDER — MIRABEGRON ER 50 MG PO TB24: 50.0000 mg | ORAL_TABLET | Freq: Every day | ORAL | 0 refills | Status: DC

## 2019-04-18 NOTE — Telephone Encounter (Signed)
See my chart message

## 2019-05-16 DIAGNOSIS — H353133 Nonexudative age-related macular degeneration, bilateral, advanced atrophic without subfoveal involvement: Secondary | ICD-10-CM | POA: Diagnosis not present

## 2019-05-16 DIAGNOSIS — H353113 Nonexudative age-related macular degeneration, right eye, advanced atrophic without subfoveal involvement: Secondary | ICD-10-CM | POA: Diagnosis not present

## 2019-05-16 DIAGNOSIS — H3562 Retinal hemorrhage, left eye: Secondary | ICD-10-CM | POA: Diagnosis not present

## 2019-05-16 DIAGNOSIS — H59033 Cystoid macular edema following cataract surgery, bilateral: Secondary | ICD-10-CM | POA: Diagnosis not present

## 2019-05-22 DIAGNOSIS — H353221 Exudative age-related macular degeneration, left eye, with active choroidal neovascularization: Secondary | ICD-10-CM | POA: Diagnosis not present

## 2019-05-23 ENCOUNTER — Other Ambulatory Visit: Payer: Self-pay | Admitting: Urology

## 2019-05-23 NOTE — Telephone Encounter (Signed)
Patient notified to pick up samples.

## 2019-05-23 NOTE — Telephone Encounter (Signed)
Pt would like some samples of Myrbetriq 50mg .

## 2019-05-24 ENCOUNTER — Telehealth: Payer: Self-pay | Admitting: Urology

## 2019-05-24 NOTE — Telephone Encounter (Signed)
LMOM for patient to return call. Myrbetriq 50 mg was sent to pharmacy 04/18/19 in a 90 day supply.  Questioning if he has been able to pick up the medication.

## 2019-05-24 NOTE — Telephone Encounter (Signed)
Pt says it's too much trouble to come over to hospital to get samples and wants to know if we'll call in 1 week supply of Myrbetriq 50 mg to [facility] on [address]

## 2019-05-25 NOTE — Telephone Encounter (Signed)
Patient notified and was able to pick up the medication.

## 2019-06-13 ENCOUNTER — Other Ambulatory Visit: Payer: Self-pay

## 2019-06-13 ENCOUNTER — Telehealth (INDEPENDENT_AMBULATORY_CARE_PROVIDER_SITE_OTHER): Payer: PPO | Admitting: Urology

## 2019-06-13 DIAGNOSIS — Z13220 Encounter for screening for lipoid disorders: Secondary | ICD-10-CM | POA: Diagnosis not present

## 2019-06-13 DIAGNOSIS — Z125 Encounter for screening for malignant neoplasm of prostate: Secondary | ICD-10-CM | POA: Diagnosis not present

## 2019-06-13 DIAGNOSIS — R1031 Right lower quadrant pain: Secondary | ICD-10-CM | POA: Diagnosis not present

## 2019-06-13 DIAGNOSIS — N400 Enlarged prostate without lower urinary tract symptoms: Secondary | ICD-10-CM | POA: Diagnosis not present

## 2019-06-13 DIAGNOSIS — Z0000 Encounter for general adult medical examination without abnormal findings: Secondary | ICD-10-CM | POA: Diagnosis not present

## 2019-06-14 NOTE — Progress Notes (Signed)
Virtual Visit via Telephone Note  I connected with Jack [name] on 06/13/2019 at  8:00 AM EDT by telephone and verified that I am speaking with the correct person using two identifiers.  Location: Patient: Home Provider: Work office   I discussed the limitations, risks, security and privacy concerns of performing an evaluation and management service by telephone and the availability of in person appointments. I also discussed with the patient that there may be a patient responsible charge related to this service. The patient expressed understanding and agreed to proceed.   History of Present Illness: 85 y.o. male with history of bilateral ureteroscopy in [place] in February 2019.  He was seen in the ED here with right flank pain found to have perforation of his right proximal ureter with the stent outside of the ureter.  He subsequently underwent replacement of his ureteral stent on 10/16/2017 and his stent was removed on 2/25.  Follow-up renal ultrasound showed no hydronephrosis.  He did have a 4 mm left lower pole nonobstructing renal calculus.  He has urinary frequency, urgency and is on Myrbetriq.  He has no voiding complaints.     Observations/Objective: N/A  Assessment and Plan:  - Nephrolithiasis Nonobstructing left lower pole calculus.  Will obtain a KUB within the next 4 to 6 weeks and he will be notified with results.  Continue annual follow-up  - Urinary frequency/urgency Doing well on Myrbetriq.  He did not need a refill.  Follow Up Instructions: Follow-up 6 months   I discussed the assessment and treatment plan with the patient. The patient was provided an opportunity to ask questions and all were answered. The patient agreed with the plan and demonstrated an understanding of the instructions.   The patient was advised to call back or seek an in-person evaluation if the symptoms worsen or if the condition fails to improve as anticipated.  I provided 11 minutes of  non-face-to-face time during this encounter.   Abbie Sons, MD

## 2019-06-19 DIAGNOSIS — H59033 Cystoid macular edema following cataract surgery, bilateral: Secondary | ICD-10-CM | POA: Diagnosis not present

## 2019-06-19 DIAGNOSIS — H353113 Nonexudative age-related macular degeneration, right eye, advanced atrophic without subfoveal involvement: Secondary | ICD-10-CM | POA: Diagnosis not present

## 2019-06-19 DIAGNOSIS — H353221 Exudative age-related macular degeneration, left eye, with active choroidal neovascularization: Secondary | ICD-10-CM | POA: Diagnosis not present

## 2019-06-19 DIAGNOSIS — H3562 Retinal hemorrhage, left eye: Secondary | ICD-10-CM | POA: Diagnosis not present

## 2019-06-20 DIAGNOSIS — I10 Essential (primary) hypertension: Secondary | ICD-10-CM | POA: Diagnosis not present

## 2019-06-20 DIAGNOSIS — Z0000 Encounter for general adult medical examination without abnormal findings: Secondary | ICD-10-CM | POA: Diagnosis not present

## 2019-06-20 DIAGNOSIS — N400 Enlarged prostate without lower urinary tract symptoms: Secondary | ICD-10-CM | POA: Diagnosis not present

## 2019-06-25 ENCOUNTER — Telehealth: Payer: Self-pay | Admitting: Urology

## 2019-06-25 ENCOUNTER — Encounter: Payer: Self-pay | Admitting: Urology

## 2019-06-25 NOTE — Telephone Encounter (Signed)
-----   Message from Abbie Sons, MD sent at 06/22/2019  3:49 PM EDT ----- Regarding: PSA Patient had a PSA drawn at [facility] 9/30 that was elevated at 21.  Recommend lab visit for repeat PSA in 4 to 6 weeks.

## 2019-06-25 NOTE — Telephone Encounter (Signed)
App made and mailed °

## 2019-07-17 DIAGNOSIS — H353113 Nonexudative age-related macular degeneration, right eye, advanced atrophic without subfoveal involvement: Secondary | ICD-10-CM | POA: Diagnosis not present

## 2019-07-17 DIAGNOSIS — H353221 Exudative age-related macular degeneration, left eye, with active choroidal neovascularization: Secondary | ICD-10-CM | POA: Diagnosis not present

## 2019-07-27 ENCOUNTER — Other Ambulatory Visit: Payer: Self-pay | Admitting: Family Medicine

## 2019-07-27 DIAGNOSIS — R972 Elevated prostate specific antigen [PSA]: Secondary | ICD-10-CM

## 2019-07-30 ENCOUNTER — Other Ambulatory Visit: Payer: PPO

## 2019-07-30 ENCOUNTER — Other Ambulatory Visit: Payer: Self-pay

## 2019-07-30 DIAGNOSIS — R972 Elevated prostate specific antigen [PSA]: Secondary | ICD-10-CM

## 2019-07-31 ENCOUNTER — Telehealth: Payer: Self-pay

## 2019-07-31 LAB — PSA: Prostate Specific Ag, Serum: 17.8 ng/mL — ABNORMAL HIGH (ref 0.0–4.0)

## 2019-07-31 NOTE — Telephone Encounter (Signed)
Called pt informed him of the information below. Pt gave verbal understanding. Pt scheduled.

## 2019-07-31 NOTE — Telephone Encounter (Signed)
-----   Message from Abbie Sons, MD sent at 07/31/2019  3:22 PM EST ----- Repeat PSA remains elevated at 17.8.  Recommend scheduling a telephone visit to discuss options.

## 2019-08-03 ENCOUNTER — Telehealth (INDEPENDENT_AMBULATORY_CARE_PROVIDER_SITE_OTHER): Payer: PPO | Admitting: Urology

## 2019-08-03 ENCOUNTER — Other Ambulatory Visit: Payer: Self-pay

## 2019-08-03 DIAGNOSIS — R972 Elevated prostate specific antigen [PSA]: Secondary | ICD-10-CM | POA: Diagnosis not present

## 2019-08-03 NOTE — Progress Notes (Signed)
Virtual Visit via Telephone Note  I connected with Jack [name] on 08/03/19 at 11:30 AM EST by telephone and verified that I am speaking with the correct person using two identifiers.  Location: Patient: Home Provider: [facility]   I discussed the limitations, risks, security and privacy concerns of performing an evaluation and management service by telephone and the availability of in person appointments. I also discussed with the patient that there may be a patient responsible charge related to this service. The patient expressed understanding and agreed to proceed.   History of Present Illness: 85 y.o. male with a history of stone disease and BPH.  He inadvertently had a PSA performed at his PCP practice September 2020 which was 21.18.  Repeat PSA November 2020 was 17.8.  He had a PSA in 2013 which was elevated at 9.7 and declined urologic evaluation.  Virtual visit was scheduled to discuss options of his elevated PSA.  He denies bothersome lower urinary tract symptoms, dysuria or gross hematuria.  He gives a long history of chronic prostatitis.   Observations/Objective: N/A  Assessment and Plan:  - Elevated PSA I had a detailed discussion with Jack [name] regarding an elevated PSA.  We discussed possibility of prostate cancer and based on his age there is a 50% chance he would have low-grade cancer.  Other causes of an elevated PSA including BPH and inflammation were discussed.  The possibility of a high-grade cancer cannot be excluded without further evaluation with either a prostate biopsy or prostate MRI.  He indicated he understood the possibility of high-grade prostate cancer however does not desire further evaluation.  He states he is willing to accept the risk of undiagnosed prostate cancer and does not desire further evaluation or further PSA testing.  Follow Up Instructions: He will continue follow-up with Dr. Lovie Macadamia and return here as needed per his  desire.   I discussed the assessment and treatment plan with the patient. The patient was provided an opportunity to ask questions and all were answered. The patient agreed with the plan and demonstrated an understanding of the instructions.   The patient was advised to call back or seek an in-person evaluation if the symptoms worsen or if the condition fails to improve as anticipated.  I provided 10 minutes of non-face-to-face time during this encounter.   Abbie Sons, MD

## 2019-08-14 DIAGNOSIS — H353221 Exudative age-related macular degeneration, left eye, with active choroidal neovascularization: Secondary | ICD-10-CM | POA: Diagnosis not present

## 2019-08-14 DIAGNOSIS — H353113 Nonexudative age-related macular degeneration, right eye, advanced atrophic without subfoveal involvement: Secondary | ICD-10-CM | POA: Diagnosis not present

## 2019-08-30 ENCOUNTER — Other Ambulatory Visit: Payer: Self-pay | Admitting: Urology

## 2019-08-30 DIAGNOSIS — N9971 Accidental puncture and laceration of a genitourinary system organ or structure during a genitourinary system procedure: Secondary | ICD-10-CM

## 2019-08-30 DIAGNOSIS — N9972 Accidental puncture and laceration of a genitourinary system organ or structure during other procedure: Secondary | ICD-10-CM

## 2019-08-31 ENCOUNTER — Telehealth: Payer: Self-pay | Admitting: Urology

## 2019-08-31 NOTE — Telephone Encounter (Signed)
Pt called back and states that he found out that he is in the "donut hole" with his Insurance and wants to know if he can have just a couple weeks of samples to get him through to 09/14/2019. I spoke with [place] and she states that she will put him some samples up front. Thanks

## 2019-08-31 NOTE — Telephone Encounter (Signed)
Pt has been paying $90 for a 3 month supply of Myrbetriq 50 mg.  He went to have it filled today and it was going to cost of over $300.  He wants to know if there is something else he could take that was cheaper.

## 2019-09-11 DIAGNOSIS — H353221 Exudative age-related macular degeneration, left eye, with active choroidal neovascularization: Secondary | ICD-10-CM | POA: Diagnosis not present

## 2019-09-11 DIAGNOSIS — H353113 Nonexudative age-related macular degeneration, right eye, advanced atrophic without subfoveal involvement: Secondary | ICD-10-CM | POA: Diagnosis not present

## 2019-09-11 DIAGNOSIS — H3562 Retinal hemorrhage, left eye: Secondary | ICD-10-CM | POA: Diagnosis not present

## 2019-09-11 DIAGNOSIS — H35373 Puckering of macula, bilateral: Secondary | ICD-10-CM | POA: Diagnosis not present

## 2019-09-27 NOTE — Progress Notes (Signed)
Letter faxed to [facility] at the request of the patient in order to justify taking the medication.

## 2019-10-09 DIAGNOSIS — H35373 Puckering of macula, bilateral: Secondary | ICD-10-CM | POA: Diagnosis not present

## 2019-10-09 DIAGNOSIS — H353113 Nonexudative age-related macular degeneration, right eye, advanced atrophic without subfoveal involvement: Secondary | ICD-10-CM | POA: Diagnosis not present

## 2019-10-09 DIAGNOSIS — H353221 Exudative age-related macular degeneration, left eye, with active choroidal neovascularization: Secondary | ICD-10-CM | POA: Diagnosis not present

## 2019-10-29 DIAGNOSIS — H353221 Exudative age-related macular degeneration, left eye, with active choroidal neovascularization: Secondary | ICD-10-CM | POA: Diagnosis not present

## 2019-10-29 DIAGNOSIS — H353113 Nonexudative age-related macular degeneration, right eye, advanced atrophic without subfoveal involvement: Secondary | ICD-10-CM | POA: Diagnosis not present

## 2019-11-05 DIAGNOSIS — H353221 Exudative age-related macular degeneration, left eye, with active choroidal neovascularization: Secondary | ICD-10-CM | POA: Diagnosis not present

## 2019-12-03 DIAGNOSIS — H59033 Cystoid macular edema following cataract surgery, bilateral: Secondary | ICD-10-CM | POA: Diagnosis not present

## 2019-12-03 DIAGNOSIS — H3562 Retinal hemorrhage, left eye: Secondary | ICD-10-CM | POA: Diagnosis not present

## 2019-12-03 DIAGNOSIS — H353221 Exudative age-related macular degeneration, left eye, with active choroidal neovascularization: Secondary | ICD-10-CM | POA: Diagnosis not present

## 2019-12-03 DIAGNOSIS — H353113 Nonexudative age-related macular degeneration, right eye, advanced atrophic without subfoveal involvement: Secondary | ICD-10-CM | POA: Diagnosis not present

## 2019-12-12 NOTE — Progress Notes (Signed)
12/13/19 8:36 PM   Jack [name] Apr 11, 1933 [id number]  Referring provider: [name], MD [phone] [address] [place],  [place] [address]  Chief Complaint  Patient presents with  . Follow-up    HPI: 86 y.o. male who returns today for a 6 month f/u.   -hx of stone disease and BPH  -no bothersome urinary symptoms  -remains on Myrbetriq 50 mg 1/2 pill per day, working perfectly -Nocturia x1 -Daytime frequency q 2-3 hrs secondary to water intake  -Denies gross hematuria, abdominal and flank pain   PSA trend:  9.7, 2013 21.18, 9/20  17.8, 11/20   PMH: Past Medical History:  Diagnosis Date  . Elevated prostate specific antigen (PSA)    has been 7 for a year   . GERD (gastroesophageal reflux disease)   . History of colon polyps 2008   [facility],   . History of kidney stones   . Hyperlipidemia   . Hypertension   . Prostate hypertrophy    diagnosed at age 17 due to hematospermia    Surgical History: Past Surgical History:  Procedure Laterality Date  . CATARACT EXTRACTION W/PHACO Left 01/10/2018   Procedure: CATARACT EXTRACTION PHACO AND INTRAOCULAR LENS PLACEMENT (IOC);  Surgeon: Birder Robson, MD;  Location: [facility] ORS;  Service: Ophthalmology;  Laterality: Left;  [name] 00:24.8 AP% 14.9 CDE 3.68 Fluid pack lot # [id number]  . CATARACT EXTRACTION W/PHACO Right 01/25/2018   Procedure: CATARACT EXTRACTION PHACO AND INTRAOCULAR LENS PLACEMENT (IOC);  Surgeon: Birder Robson, MD;  Location: [facility] ORS;  Service: Ophthalmology;  Laterality: Right;  [name] 00:42 AP% 10.8 CDE 4.59 Fluid pack lot # [id number]  . COLON SURGERY    . CYSTOSCOPY W/ URETERAL STENT PLACEMENT Right 10/16/2017   Procedure: right  URETERAL STENT PLACEMENT,cystoscopy bilateral stent removal,rretrograde;  Surgeon: Abbie Sons, MD;  Location: [facility] ORS;  Service: Urology;  Laterality: Right;  . KIDNEY STONE SURGERY    . RESECTION SOFT TISSUE TUMOR LEG / ANKLE RADICAL  jan 2009   [facility],  right  thigh/knee , nonmalignant  . SMALL INTESTINE SURGERY  1946   implaed on picket fence, punctured stomach  . TONSILLECTOMY      Home Medications:  Allergies as of 12/13/2019      Reactions   Amlodipine Other (See Comments)   LE edema   Azithromycin Nausea And Vomiting   Lisinopril Cough      Medication List       Accurate as of December 13, 2019  8:36 PM. If you have any questions, ask your nurse or doctor.        alfuzosin 10 MG 24 hr tablet Commonly known as: UROXATRAL Take 10 mg by mouth daily with breakfast.   beta carotene 25000 UNIT capsule Take 25,000 Units by mouth daily.   cholecalciferol 1000 units tablet Commonly known as: VITAMIN D Take 1,000 Units by mouth daily.   FISH OIL PO Take 1 capsule by mouth daily.   losartan-hydrochlorothiazide 50-12.5 MG tablet Commonly known as: HYZAAR Take 1 tablet by mouth daily.   Myrbetriq 50 MG Tb24 tablet Generic drug: mirabegron ER TAKE 1 TABLET BY MOUTH EVERY DAY   OSTEO BI-FLEX ADV DOUBLE ST PO Take 1 tablet by mouth 2 (two) times daily.   PRESERVISION AREDS 2 PO Take 1 capsule by mouth 2 (two) times daily.   thiamine 50 MG tablet Commonly known as: VITAMIN B-1 Take 50 mg by mouth daily.   Turmeric 500 MG Caps Take 500 mg by  mouth 2 (two) times daily.   VITAMIN B-12 PO Take 1 tablet by mouth daily.   vitamin C 1000 MG tablet Take 1,000 mg by mouth daily.   Zinc 50 MG Caps Take 50 mg by mouth daily.       Allergies:  Allergies  Allergen Reactions  . Amlodipine Other (See Comments)    LE edema  . Azithromycin Nausea And Vomiting  . Lisinopril Cough    Family History: Family History  Problem Relation Age of Onset  . Hypertension Father   . Hyperlipidemia Father   . Cancer Sister        thyroid - dx in late 20's    Social History:  reports that he quit smoking about 54 years ago. He has never used smokeless tobacco. He reports current alcohol use of about 1.0 standard drinks of alcohol per  week. He reports that he does not use drugs.   Physical Exam: BP (!) 141/84   Pulse 73   Ht 5\' 9"  (1.753 m)   Wt 199 lb (90.3 kg)   BMI 29.39 kg/m   Constitutional:  Alert and oriented, No acute distress. HEENT: [place] AT, moist mucus membranes.  Trachea midline, no masses. Cardiovascular: No clubbing, cyanosis, or edema. Respiratory: Normal respiratory effort, no increased work of breathing. Skin: No rashes, bruises or suspicious lesions. Neurologic: Grossly intact, no focal deficits, moving all 4 extremities. Psychiatric: Normal mood and affect.  Assessment & Plan:    1. BPH with LUTS  No bothersome urinary symptoms  Doing well on Myrbetriq 50 mg, 1/2 pill per day  Return for annual f/u or sooner if symptoms worsen   2. Elevated PSA  Pt understood the possibility of high-grade prostate cancer however does not desire further evaluation.    Abbie Sons, [place] [address], [place], [place] [address] [phone]  I, Lucas Mallow, am acting as a scribe for Dr. Nicki Reaper C. Madysin Crisp,  I have reviewed the above documentation for accuracy and completeness, and I agree with the above.   Abbie Sons, MD

## 2019-12-13 ENCOUNTER — Other Ambulatory Visit: Payer: Self-pay

## 2019-12-13 ENCOUNTER — Encounter: Payer: Self-pay | Admitting: Urology

## 2019-12-13 ENCOUNTER — Ambulatory Visit: Payer: PPO | Admitting: Urology

## 2019-12-13 VITALS — BP 141/84 | HR 73 | Ht 69.0 in | Wt 199.0 lb

## 2019-12-13 DIAGNOSIS — R350 Frequency of micturition: Secondary | ICD-10-CM

## 2019-12-13 DIAGNOSIS — R3915 Urgency of urination: Secondary | ICD-10-CM

## 2019-12-14 ENCOUNTER — Ambulatory Visit: Payer: PPO | Admitting: Urology

## 2019-12-29 ENCOUNTER — Other Ambulatory Visit: Payer: Self-pay | Admitting: Urology

## 2019-12-29 DIAGNOSIS — N9972 Accidental puncture and laceration of a genitourinary system organ or structure during other procedure: Secondary | ICD-10-CM

## 2019-12-29 DIAGNOSIS — N9971 Accidental puncture and laceration of a genitourinary system organ or structure during a genitourinary system procedure: Secondary | ICD-10-CM

## 2019-12-31 DIAGNOSIS — H353221 Exudative age-related macular degeneration, left eye, with active choroidal neovascularization: Secondary | ICD-10-CM | POA: Diagnosis not present

## 2019-12-31 DIAGNOSIS — H59031 Cystoid macular edema following cataract surgery, right eye: Secondary | ICD-10-CM | POA: Diagnosis not present

## 2019-12-31 DIAGNOSIS — H43812 Vitreous degeneration, left eye: Secondary | ICD-10-CM | POA: Diagnosis not present

## 2019-12-31 DIAGNOSIS — H353113 Nonexudative age-related macular degeneration, right eye, advanced atrophic without subfoveal involvement: Secondary | ICD-10-CM | POA: Diagnosis not present

## 2020-01-25 ENCOUNTER — Telehealth: Payer: Self-pay

## 2020-01-25 NOTE — Telephone Encounter (Signed)
Pt can not get myrbetriq from [place]. PT states they need him to have tried oxybutnin first. I went back to 2019 when we first saw patient and see no record of pt being on oxybutnin. Advised pt to call prior doctor before stoioff. Patient verbalized understanding.

## 2020-01-30 NOTE — Telephone Encounter (Signed)
Patient called stating he did try oxybutinin at [facility] urology and he will be notifying the [place] of this. He states that the [place] has requested a copy of Dr. Dene Gentry last office note to be faxed to their office [phone] att Dr. Erasmo Leventhal, this was done at patient's request

## 2020-02-14 ENCOUNTER — Encounter: Payer: Self-pay | Admitting: Urology

## 2020-02-14 ENCOUNTER — Other Ambulatory Visit: Payer: Self-pay | Admitting: Urology

## 2020-02-14 MED ORDER — TROSPIUM CHLORIDE 20 MG PO TABS: 20.0000 mg | ORAL_TABLET | Freq: Two times a day (BID) | ORAL | 0 refills | Status: DC

## 2020-03-07 DIAGNOSIS — H353221 Exudative age-related macular degeneration, left eye, with active choroidal neovascularization: Secondary | ICD-10-CM | POA: Diagnosis not present

## 2020-03-08 ENCOUNTER — Other Ambulatory Visit: Payer: Self-pay | Admitting: Urology

## 2020-03-31 DIAGNOSIS — J9811 Atelectasis: Secondary | ICD-10-CM | POA: Diagnosis not present

## 2020-03-31 DIAGNOSIS — M546 Pain in thoracic spine: Secondary | ICD-10-CM | POA: Diagnosis not present

## 2020-03-31 DIAGNOSIS — W1789XA Other fall from one level to another, initial encounter: Secondary | ICD-10-CM | POA: Diagnosis not present

## 2020-03-31 DIAGNOSIS — J929 Pleural plaque without asbestos: Secondary | ICD-10-CM | POA: Diagnosis not present

## 2020-03-31 DIAGNOSIS — S2241XA Multiple fractures of ribs, right side, initial encounter for closed fracture: Secondary | ICD-10-CM | POA: Diagnosis not present

## 2020-03-31 DIAGNOSIS — M47814 Spondylosis without myelopathy or radiculopathy, thoracic region: Secondary | ICD-10-CM | POA: Diagnosis not present

## 2020-05-05 DIAGNOSIS — H353113 Nonexudative age-related macular degeneration, right eye, advanced atrophic without subfoveal involvement: Secondary | ICD-10-CM | POA: Diagnosis not present

## 2020-05-05 DIAGNOSIS — H59031 Cystoid macular edema following cataract surgery, right eye: Secondary | ICD-10-CM | POA: Diagnosis not present

## 2020-05-05 DIAGNOSIS — H43812 Vitreous degeneration, left eye: Secondary | ICD-10-CM | POA: Diagnosis not present

## 2020-05-05 DIAGNOSIS — H353221 Exudative age-related macular degeneration, left eye, with active choroidal neovascularization: Secondary | ICD-10-CM | POA: Diagnosis not present

## 2020-06-03 ENCOUNTER — Other Ambulatory Visit: Payer: Self-pay | Admitting: Urology

## 2020-06-23 DIAGNOSIS — H353113 Nonexudative age-related macular degeneration, right eye, advanced atrophic without subfoveal involvement: Secondary | ICD-10-CM | POA: Diagnosis not present

## 2020-06-23 DIAGNOSIS — H353221 Exudative age-related macular degeneration, left eye, with active choroidal neovascularization: Secondary | ICD-10-CM | POA: Diagnosis not present

## 2020-08-01 MED FILL — LOSARTAN-HCTZ 50-12.5 MG TAB: 90 days supply | Qty: 90 | Fill #1

## 2020-08-04 DIAGNOSIS — H353221 Exudative age-related macular degeneration, left eye, with active choroidal neovascularization: Secondary | ICD-10-CM | POA: Diagnosis not present

## 2020-08-04 DIAGNOSIS — H353113 Nonexudative age-related macular degeneration, right eye, advanced atrophic without subfoveal involvement: Secondary | ICD-10-CM | POA: Diagnosis not present

## 2020-08-04 DIAGNOSIS — H43812 Vitreous degeneration, left eye: Secondary | ICD-10-CM | POA: Diagnosis not present

## 2020-09-15 DIAGNOSIS — H353113 Nonexudative age-related macular degeneration, right eye, advanced atrophic without subfoveal involvement: Secondary | ICD-10-CM | POA: Diagnosis not present

## 2020-09-15 DIAGNOSIS — H353221 Exudative age-related macular degeneration, left eye, with active choroidal neovascularization: Secondary | ICD-10-CM | POA: Diagnosis not present

## 2020-10-27 DIAGNOSIS — H353113 Nonexudative age-related macular degeneration, right eye, advanced atrophic without subfoveal involvement: Secondary | ICD-10-CM | POA: Diagnosis not present

## 2020-10-27 DIAGNOSIS — H353221 Exudative age-related macular degeneration, left eye, with active choroidal neovascularization: Secondary | ICD-10-CM | POA: Diagnosis not present

## 2020-10-28 DIAGNOSIS — I10 Essential (primary) hypertension: Secondary | ICD-10-CM | POA: Diagnosis not present

## 2020-10-28 DIAGNOSIS — E041 Nontoxic single thyroid nodule: Secondary | ICD-10-CM | POA: Diagnosis not present

## 2020-10-28 DIAGNOSIS — Z1331 Encounter for screening for depression: Secondary | ICD-10-CM | POA: Diagnosis not present

## 2020-10-28 DIAGNOSIS — N200 Calculus of kidney: Secondary | ICD-10-CM | POA: Diagnosis not present

## 2020-10-28 DIAGNOSIS — Z0000 Encounter for general adult medical examination without abnormal findings: Secondary | ICD-10-CM | POA: Diagnosis not present

## 2020-10-28 DIAGNOSIS — R972 Elevated prostate specific antigen [PSA]: Secondary | ICD-10-CM | POA: Diagnosis not present

## 2020-10-28 DIAGNOSIS — N400 Enlarged prostate without lower urinary tract symptoms: Secondary | ICD-10-CM | POA: Diagnosis not present

## 2020-10-29 DIAGNOSIS — E041 Nontoxic single thyroid nodule: Secondary | ICD-10-CM | POA: Diagnosis not present

## 2020-10-29 DIAGNOSIS — R972 Elevated prostate specific antigen [PSA]: Secondary | ICD-10-CM | POA: Diagnosis not present

## 2020-10-29 DIAGNOSIS — N200 Calculus of kidney: Secondary | ICD-10-CM | POA: Diagnosis not present

## 2020-10-29 DIAGNOSIS — I10 Essential (primary) hypertension: Secondary | ICD-10-CM | POA: Diagnosis not present

## 2020-10-29 DIAGNOSIS — N400 Enlarged prostate without lower urinary tract symptoms: Secondary | ICD-10-CM | POA: Diagnosis not present

## 2020-10-29 DIAGNOSIS — Z1331 Encounter for screening for depression: Secondary | ICD-10-CM | POA: Diagnosis not present

## 2020-10-29 DIAGNOSIS — Z0000 Encounter for general adult medical examination without abnormal findings: Secondary | ICD-10-CM | POA: Diagnosis not present

## 2020-11-12 MED FILL — LOSARTAN-HCTZ 50-12.5 MG TAB: 90 days supply | Qty: 90 | Fill #0

## 2020-12-03 DIAGNOSIS — I10 Essential (primary) hypertension: Secondary | ICD-10-CM | POA: Diagnosis not present

## 2020-12-08 ENCOUNTER — Other Ambulatory Visit: Payer: Self-pay

## 2020-12-08 ENCOUNTER — Ambulatory Visit (INDEPENDENT_AMBULATORY_CARE_PROVIDER_SITE_OTHER): Payer: PPO | Admitting: Urology

## 2020-12-08 ENCOUNTER — Ambulatory Visit
Admission: RE | Admit: 2020-12-08 | Discharge: 2020-12-08 | Disposition: A | Discharge status: Home / Self Care | Payer: PPO | Source: Ambulatory Visit | Attending: Urology | Admitting: Urology

## 2020-12-08 ENCOUNTER — Encounter: Payer: Self-pay | Admitting: Urology

## 2020-12-08 VITALS — BP 117/79 | HR 97 | Ht 69.0 in | Wt 209.0 lb

## 2020-12-08 DIAGNOSIS — R1011 Right upper quadrant pain: Secondary | ICD-10-CM

## 2020-12-08 DIAGNOSIS — N201 Calculus of ureter: Secondary | ICD-10-CM | POA: Diagnosis not present

## 2020-12-08 DIAGNOSIS — N132 Hydronephrosis with renal and ureteral calculous obstruction: Secondary | ICD-10-CM | POA: Diagnosis not present

## 2020-12-08 DIAGNOSIS — R350 Frequency of micturition: Secondary | ICD-10-CM

## 2020-12-08 DIAGNOSIS — K449 Diaphragmatic hernia without obstruction or gangrene: Secondary | ICD-10-CM | POA: Diagnosis not present

## 2020-12-08 DIAGNOSIS — E278 Other specified disorders of adrenal gland: Secondary | ICD-10-CM | POA: Diagnosis not present

## 2020-12-08 DIAGNOSIS — Z87442 Personal history of urinary calculi: Secondary | ICD-10-CM

## 2020-12-08 DIAGNOSIS — N400 Enlarged prostate without lower urinary tract symptoms: Secondary | ICD-10-CM | POA: Diagnosis not present

## 2020-12-08 NOTE — Progress Notes (Signed)
   12/08/2020 3:45 PM   Jack [name] 1932-12-15 [id number]  Reason for visit: Right flank pain  HPI: I saw [name] as an add-on in urology clinic today for evaluation of right flank pain and possible kidney stone.  He is an 87 year old male who is regularly followed by Dr. Bernardo Heater for elevated PSA on watchful waiting he refused further work-up, as well as nephrolithiasis.  His history is notable for right-sided ureteroscopy in [facility] in 1829 that was complicated by proximal ureteral perforation with migrated right ureteral stent that ultimately was read placed by Dr. Bernardo Heater a few days later.  His stent was ultimately removed in clinic 2 weeks later and a follow-up renal ultrasound showed no hydronephrosis.  He presents today with 2 weeks of right-sided flank pain.  His pain is worse with physical activity.  He has been taking some leftover oxycodone at home, as well as Tylenol.  The Tylenol seems to help his pain the most.  He denies any fevers or chills.  Urinalysis is completely benign today with 0-5 WBCs, 0-2 RBCs, no bacteria, nitrite negative, no leukocytes.  I ordered a stat CT scan today with his complex history of right ureteral perforation, and this showed a 1.5 cm right proximal ureteral stone with upstream hydronephrosis, 550HU, 13cm SSD, clearly seen on scout CT scan.  We discussed various treatment options for urolithiasis including observation with or without medical expulsive therapy, shockwave lithotripsy (SWL), ureteroscopy and laser lithotripsy with stent placement, and percutaneous nephrolithotomy.  We discussed that management is based on stone size, location, density, patient co-morbidities, and patient preference.   Stones <18mm in size have a >80% spontaneous passage rate. Data surrounding the use of tamsulosin for medical expulsive therapy is controversial, but meta analyses suggests it is most efficacious for distal stones between 5-61mm in size. Possible side  effects include dizziness/lightheadedness, and retrograde ejaculation.  SWL has a lower stone free rate in a single procedure, but also a lower complication rate compared to ureteroscopy and avoids a stent and associated stent related symptoms. Possible complications include renal hematoma, steinstrasse, and need for additional treatment.  Ureteroscopy with laser lithotripsy and stent placement has a higher stone free rate than SWL in a single procedure, however increased complication rate including possible infection, ureteral injury, bleeding, and stent related morbidity. Common stent related symptoms include dysuria, urgency/frequency, and flank pain.  After an extensive discussion of the risks and benefits of the above treatment options, the patient would like to proceed with shockwave lithotripsy.  He is hesitant to undergo ureteroscopy with his history of a right proximal ureteral perforation which is certainly understandable.  We discussed the possibility of stricture or scar tissue at that location that may necessitate ureteroscopy if he fails shockwave lithotripsy.  Schedule right shockwave lithotripsy this Thursday   I spent 45 total minutes on the day of the encounter including pre-visit review of the medical record, face-to-face time with the patient, and post visit ordering of labs/imaging/tests.  Billey Co, [facility] [address], [place], [place] [address] [phone]

## 2020-12-08 NOTE — H&P (View-Only) (Signed)
   12/08/2020 3:45 PM   Jack [name] 08-22-1933 [id number]  Reason for visit: Right flank pain  HPI: I saw [name] as an add-on in urology clinic today for evaluation of right flank pain and possible kidney stone.  He is an 87 year old male who is regularly followed by Dr. Bernardo Heater for elevated PSA on watchful waiting he refused further work-up, as well as nephrolithiasis.  His history is notable for right-sided ureteroscopy in [facility] in 4650 that was complicated by proximal ureteral perforation with migrated right ureteral stent that ultimately was read placed by Dr. Bernardo Heater a few days later.  His stent was ultimately removed in clinic 2 weeks later and a follow-up renal ultrasound showed no hydronephrosis.  He presents today with 2 weeks of right-sided flank pain.  His pain is worse with physical activity.  He has been taking some leftover oxycodone at home, as well as Tylenol.  The Tylenol seems to help his pain the most.  He denies any fevers or chills.  Urinalysis is completely benign today with 0-5 WBCs, 0-2 RBCs, no bacteria, nitrite negative, no leukocytes.  I ordered a stat CT scan today with his complex history of right ureteral perforation, and this showed a 1.5 cm right proximal ureteral stone with upstream hydronephrosis, 550HU, 13cm SSD, clearly seen on scout CT scan.  We discussed various treatment options for urolithiasis including observation with or without medical expulsive therapy, shockwave lithotripsy (SWL), ureteroscopy and laser lithotripsy with stent placement, and percutaneous nephrolithotomy.  We discussed that management is based on stone size, location, density, patient co-morbidities, and patient preference.   Stones <58mm in size have a >80% spontaneous passage rate. Data surrounding the use of tamsulosin for medical expulsive therapy is controversial, but meta analyses suggests it is most efficacious for distal stones between 5-21mm in size. Possible side  effects include dizziness/lightheadedness, and retrograde ejaculation.  SWL has a lower stone free rate in a single procedure, but also a lower complication rate compared to ureteroscopy and avoids a stent and associated stent related symptoms. Possible complications include renal hematoma, steinstrasse, and need for additional treatment.  Ureteroscopy with laser lithotripsy and stent placement has a higher stone free rate than SWL in a single procedure, however increased complication rate including possible infection, ureteral injury, bleeding, and stent related morbidity. Common stent related symptoms include dysuria, urgency/frequency, and flank pain.  After an extensive discussion of the risks and benefits of the above treatment options, the patient would like to proceed with shockwave lithotripsy.  He is hesitant to undergo ureteroscopy with his history of a right proximal ureteral perforation which is certainly understandable.  We discussed the possibility of stricture or scar tissue at that location that may necessitate ureteroscopy if he fails shockwave lithotripsy.  Schedule right shockwave lithotripsy this Thursday   I spent 45 total minutes on the day of the encounter including pre-visit review of the medical record, face-to-face time with the patient, and post visit ordering of labs/imaging/tests.  Billey Co, [facility] [address] [place], [place] [address] [phone]

## 2020-12-09 ENCOUNTER — Other Ambulatory Visit: Payer: Self-pay | Admitting: Urology

## 2020-12-09 DIAGNOSIS — N201 Calculus of ureter: Secondary | ICD-10-CM

## 2020-12-09 LAB — URINALYSIS, COMPLETE
Bilirubin, UA: NEGATIVE
Glucose, UA: NEGATIVE
Ketones, UA: NEGATIVE
Leukocytes,UA: NEGATIVE
Nitrite, UA: NEGATIVE
Specific Gravity, UA: 1.02 (ref 1.005–1.030)
Urobilinogen, Ur: 0.2 mg/dL (ref 0.2–1.0)
pH, UA: 6 (ref 5.0–7.5)

## 2020-12-09 LAB — MICROSCOPIC EXAMINATION: Bacteria, UA: NONE SEEN

## 2020-12-09 MED ORDER — CEPHALEXIN 250 MG PO CAPS: 500.0000 mg | ORAL_CAPSULE | ORAL | Status: AC

## 2020-12-10 MED ORDER — DIAZEPAM 5 MG PO TABS: 10.0000 mg | ORAL_TABLET | ORAL | Status: AC

## 2020-12-10 MED ORDER — SODIUM CHLORIDE 0.9 % IV SOLN: INTRAVENOUS | Status: DC

## 2020-12-10 MED ORDER — ONDANSETRON HCL 4 MG/2ML IJ SOLN
4.0000 mg | Freq: Once | INTRAMUSCULAR | Status: AC | PRN
  Administered 2020-12-11: 4 mg via INTRAVENOUS

## 2020-12-10 MED ORDER — DIPHENHYDRAMINE HCL 25 MG PO CAPS: 25.0000 mg | ORAL_CAPSULE | ORAL | Status: AC

## 2020-12-11 ENCOUNTER — Ambulatory Visit
Admission: RE | Admit: 2020-12-11 | Discharge: 2020-12-11 | Disposition: A | Discharge status: Home / Self Care | Payer: PPO | Attending: Urology | Admitting: Urology

## 2020-12-11 ENCOUNTER — Encounter
Admission: RE | Disposition: A | Discharge status: Home / Self Care | Payer: Self-pay | Source: Home / Self Care | Attending: Urology

## 2020-12-11 ENCOUNTER — Ambulatory Visit: Payer: PPO

## 2020-12-11 ENCOUNTER — Encounter: Payer: Self-pay | Admitting: Urology

## 2020-12-11 DIAGNOSIS — R109 Unspecified abdominal pain: Secondary | ICD-10-CM | POA: Diagnosis not present

## 2020-12-11 DIAGNOSIS — N132 Hydronephrosis with renal and ureteral calculous obstruction: Secondary | ICD-10-CM | POA: Diagnosis not present

## 2020-12-11 DIAGNOSIS — N201 Calculus of ureter: Secondary | ICD-10-CM | POA: Diagnosis not present

## 2020-12-11 HISTORY — PX: EXTRACORPOREAL SHOCK WAVE LITHOTRIPSY: SHX1557

## 2020-12-11 SURGERY — LITHOTRIPSY, ESWL
Anesthesia: Moderate Sedation | Laterality: Right

## 2020-12-11 MED ORDER — DIAZEPAM 5 MG PO TABS
ORAL_TABLET | ORAL | Status: AC
  Administered 2020-12-11: 10 mg via ORAL
  Filled 2020-12-11: qty 2

## 2020-12-11 MED ORDER — DIPHENHYDRAMINE HCL 25 MG PO CAPS
ORAL_CAPSULE | ORAL | Status: AC
  Administered 2020-12-11: 25 mg via ORAL
  Filled 2020-12-11: qty 1

## 2020-12-11 MED ORDER — HYDROCODONE-ACETAMINOPHEN 5-325 MG PO TABS: 1.0000 | ORAL_TABLET | ORAL | 0 refills | Status: DC | PRN

## 2020-12-11 MED ORDER — ONDANSETRON HCL 4 MG/2ML IJ SOLN
INTRAMUSCULAR | Status: AC
  Filled 2020-12-11: qty 2

## 2020-12-11 MED FILL — HYDROCODONE-ACETAMIN 5-325 MG: 4 days supply | Qty: 20 | Fill #0

## 2020-12-11 NOTE — Discharge Instructions (Signed)
AMBULATORY SURGERY  DISCHARGE INSTRUCTIONS   1) The drugs that you were given will stay in your system until tomorrow so for the next 24 hours you should not:  A) Drive an automobile B) Make any legal decisions C) Drink any alcoholic beverage   2) You may resume regular meals tomorrow.  Today it is better to start with liquids and gradually work up to solid foods.  You may eat anything you prefer, but it is better to start with liquids, then soup and crackers, and gradually work up to solid foods.   3) Please notify your doctor immediately if you have any unusual bleeding, trouble breathing, redness and pain at the surgery site, drainage, fever, or pain not relieved by medication.    4) Additional Instructions:        Please contact your physician with any problems or Same Day Surgery at [phone], Monday through Friday 6 am to 4 pm, or [place] at [facility] number at [phone].As per [facility] discharge instructions.  Taking alfuzosin will help you pass stone fragments

## 2020-12-11 NOTE — Interval H&P Note (Signed)
History and Physical Interval Note:  12/11/2020 8:28 AM  Jack [name]  has presented today for surgery, with the diagnosis of kidney stone.  The various methods of treatment have been discussed with the patient and family. After consideration of risks, benefits and other options for treatment, the patient has consented to  Procedure(s): EXTRACORPOREAL SHOCK WAVE LITHOTRIPSY (ESWL) (Right) as a surgical intervention.  The patient's history has been reviewed, patient examined, no change in status, stable for surgery.  I have reviewed the patient's chart and labs.  Questions were answered to the patient's satisfaction.     [place]

## 2020-12-12 ENCOUNTER — Ambulatory Visit: Payer: PPO | Admitting: Urology

## 2020-12-15 ENCOUNTER — Other Ambulatory Visit: Payer: Self-pay | Admitting: Urology

## 2020-12-15 ENCOUNTER — Other Ambulatory Visit: Payer: Self-pay

## 2020-12-15 ENCOUNTER — Ambulatory Visit: Payer: PPO | Admitting: Urology

## 2020-12-15 ENCOUNTER — Ambulatory Visit
Admission: RE | Admit: 2020-12-15 | Discharge: 2020-12-15 | Disposition: A | Discharge status: Home / Self Care | Payer: PPO | Source: Ambulatory Visit | Attending: Urology | Admitting: Urology

## 2020-12-15 ENCOUNTER — Ambulatory Visit
Admission: RE | Admit: 2020-12-15 | Discharge: 2020-12-15 | Disposition: A | Discharge status: Home / Self Care | Payer: PPO | Attending: Urology | Admitting: Urology

## 2020-12-15 ENCOUNTER — Other Ambulatory Visit
Admission: RE | Admit: 2020-12-15 | Discharge: 2020-12-15 | Disposition: A | Discharge status: Home / Self Care | Payer: PPO | Source: Home / Self Care | Attending: Urology | Admitting: Urology

## 2020-12-15 ENCOUNTER — Telehealth: Payer: Self-pay | Admitting: Physician Assistant

## 2020-12-15 ENCOUNTER — Encounter: Payer: Self-pay | Admitting: Urology

## 2020-12-15 VITALS — BP 161/101 | HR 91 | Ht 69.0 in | Wt 206.0 lb

## 2020-12-15 DIAGNOSIS — N201 Calculus of ureter: Secondary | ICD-10-CM | POA: Insufficient documentation

## 2020-12-15 DIAGNOSIS — R109 Unspecified abdominal pain: Secondary | ICD-10-CM | POA: Diagnosis not present

## 2020-12-15 LAB — URINALYSIS, COMPLETE (UACMP) WITH MICROSCOPIC
Bacteria, UA: NONE SEEN
Bilirubin Urine: NEGATIVE
Glucose, UA: NEGATIVE mg/dL
Ketones, ur: NEGATIVE mg/dL
Nitrite: NEGATIVE
Protein, ur: 100 mg/dL — AB
Specific Gravity, Urine: 1.01 (ref 1.005–1.030)
Squamous Epithelial / HPF: NONE SEEN (ref 0–5)
pH: 5.5 (ref 5.0–8.0)

## 2020-12-15 MED ORDER — TAMSULOSIN HCL 0.4 MG PO CAPS
0.4000 mg | ORAL_CAPSULE | Freq: Every day | ORAL | 3 refills | Status: DC
Start: 2020-12-15 — End: 2021-03-17

## 2020-12-15 MED ORDER — HYDROMORPHONE HCL 2 MG PO TABS: 2.0000 mg | ORAL_TABLET | ORAL | 0 refills | Status: DC | PRN

## 2020-12-15 MED ORDER — KETOROLAC TROMETHAMINE 60 MG/2ML IM SOLN
30.0000 mg | Freq: Once | INTRAMUSCULAR | Status: AC
Start: 2020-12-15 — End: 2020-12-15
  Administered 2020-12-15: 30 mg via INTRAMUSCULAR

## 2020-12-15 MED FILL — HYDROMORPHONE 2 MG TABLET: 2 days supply | Qty: 10 | Fill #0

## 2020-12-15 MED FILL — TAMSULOSIN HCL 0.4 MG CAPSULE: 90 days supply | Qty: 90 | Fill #0

## 2020-12-15 NOTE — Progress Notes (Signed)
12/15/2020 9:18 PM   Jack [name] 1932/09/30 [id number]  Referring provider: [name], MD [phone],  [place] [address]  Chief Complaint  Patient presents with  . Follow-up    Post ESWL    HPI: Jack [name] is a 87 y.o. who is status post ESWL who presents today for follow up.  Underwent ESWL on 12/12/2018 for a 1.5 cm right proximal ureteral stone with Dr. Bernardo Heater.  He states that he states that  since ESWL last week he has had very bad lower right back pain. He tried Hydrocodone the first day, but it did not help with the pain.  He states he switched to extra strength tylenol on Saturday and this seemed to help more but his pain is pretty consistent. He states he is a little better today but mostly very uncomfortable. He isn't on flomax, but states he is pushing fluids.   Pain is 7/10 in the right flank.   He has not passed any fragments.     KUB stone remains in the proximal ureter.   UA 6-10 RBC's  PMH: Past Medical History:  Diagnosis Date  . Elevated prostate specific antigen (PSA)    has been 7 for a year   . GERD (gastroesophageal reflux disease)   . History of colon polyps 2008   [facility],   . History of kidney stones   . Hyperlipidemia   . Hypertension   . Prostate hypertrophy    diagnosed at age 83 due to hematospermia    Surgical History: Past Surgical History:  Procedure Laterality Date  . CATARACT EXTRACTION W/PHACO Left 01/10/2018   Procedure: CATARACT EXTRACTION PHACO AND INTRAOCULAR LENS PLACEMENT (IOC);  Surgeon: Birder Robson, MD;  Location: [facility] ORS;  Service: Ophthalmology;  Laterality: Left;  [name] 00:24.8 AP% 14.9 CDE 3.68 Fluid pack lot # [id number]  . CATARACT EXTRACTION W/PHACO Right 01/25/2018   Procedure: CATARACT EXTRACTION PHACO AND INTRAOCULAR LENS PLACEMENT (IOC);  Surgeon: Birder Robson, MD;  Location: [facility] ORS;  Service: Ophthalmology;  Laterality: Right;  [name] 00:42 AP% 10.8 CDE 4.59 Fluid pack lot  # [id number]  . COLON SURGERY    . CYSTOSCOPY W/ URETERAL STENT PLACEMENT Right 10/16/2017   Procedure: right  URETERAL STENT PLACEMENT,cystoscopy bilateral stent removal,rretrograde;  Surgeon: Abbie Sons, MD;  Location: [facility] ORS;  Service: Urology;  Laterality: Right;  . EXTRACORPOREAL SHOCK WAVE LITHOTRIPSY Right 12/11/2020   Procedure: EXTRACORPOREAL SHOCK WAVE LITHOTRIPSY (ESWL);  Surgeon: Abbie Sons, MD;  Location: [facility] ORS;  Service: Urology;  Laterality: Right;  . KIDNEY STONE SURGERY    . RESECTION SOFT TISSUE TUMOR LEG / ANKLE RADICAL  jan 2009   [facility],  right thigh/knee , nonmalignant  . SMALL INTESTINE SURGERY  1946   implaed on picket fence, punctured stomach  . TONSILLECTOMY      Home Medications:  Current Outpatient Medications on File Prior to Visit  Medication Sig Dispense Refill  . Ascorbic Acid (VITAMIN C) 1000 MG tablet Take 1,000 mg by mouth daily.    . beta carotene 25000 UNIT capsule Take 25,000 Units by mouth daily.    . cholecalciferol (VITAMIN D) 1000 UNITS tablet Take 1,000 Units by mouth daily.    . Cyanocobalamin (VITAMIN B-12 PO) Take 1 tablet by mouth daily.    [name] losartan-hydrochlorothiazide (HYZAAR) 50-12.5 MG per tablet Take 1 tablet by mouth daily. 30 tablet 0  . magnesium oxide (MAG-OX) 400 MG tablet Take by mouth.    [name]  Misc Natural Products (OSTEO BI-FLEX ADV DOUBLE ST PO) Take 1 tablet by mouth 2 (two) times daily.     . Multiple Vitamins-Minerals (PRESERVISION AREDS 2 PO) Take 1 capsule by mouth 2 (two) times daily.     . Omega-3 Fatty Acids (FISH OIL PO) Take 1 capsule by mouth daily.     [name] thiamine (VITAMIN B-1) 50 MG tablet Take 50 mg by mouth daily.    . trospium (SANCTURA) 20 MG tablet TAKE 1 TABLET BY MOUTH TWICE A DAY 180 tablet 1  . Turmeric 500 MG CAPS Take 500 mg by mouth 2 (two) times daily.     . Zinc 50 MG CAPS Take 50 mg by mouth daily.      No current facility-administered medications on file prior to visit.    Allergies:   Allergies  Allergen Reactions  . Amlodipine Other (See Comments)    LE edema  . Azithromycin Nausea And Vomiting  . Lisinopril Cough    Family History: Family History  Problem Relation Age of Onset  . Hypertension Father   . Hyperlipidemia Father   . Cancer Sister        thyroid - dx in late 20's    Social History:  reports that he quit smoking about 55 years ago. He has never used smokeless tobacco. He reports current alcohol use of about 1.0 standard drink of alcohol per week. He reports that he does not use drugs.  ROS: Pertinent ROS in HPI  Physical Exam: BP (!) 161/101   Pulse 91   Ht '5\' 9"'  (1.753 m)   Wt 206 lb (93.4 kg)   BMI 30.42 kg/m   Constitutional:  Well nourished. Alert and oriented, No acute distress. HEENT: [place] AT, mask in place.   Trachea midline. Cardiovascular: No clubbing, cyanosis, or edema. Respiratory: Normal respiratory effort, no increased work of breathing. Neurologic: Grossly intact, no focal deficits, moving all 4 extremities. Psychiatric: Normal mood and affect.  Laboratory Data: Component     Latest Ref Rng & Units 12/15/2020  Color, Urine     YELLOW YELLOW  Appearance     CLEAR CLEAR  Specific Gravity, Urine     1.005 - 1.030 1.010  pH     5.0 - 8.0 5.5  Glucose, UA     NEGATIVE mg/dL NEGATIVE  Hgb urine dipstick     NEGATIVE MODERATE (A)  Bilirubin Urine     NEGATIVE NEGATIVE  Ketones, ur     NEGATIVE mg/dL NEGATIVE  Protein     NEGATIVE mg/dL 100 (A)  Nitrite     NEGATIVE NEGATIVE  Leukocytes,Ua     NEGATIVE TRACE (A)  Squamous Epithelial / LPF     0 - 5 NONE SEEN  WBC, UA     0 - 5 WBC/hpf 0-5  RBC / HPF     0 - 5 RBC/hpf 6-10  Bacteria, UA     NONE SEEN NONE SEEN   Component     Latest Ref Rng & Units 12/08/2020  Specific Gravity, UA     1.005 - 1.030 1.020  pH, UA     5.0 - 7.5 6.0  Color, UA     Yellow Yellow  Appearance Ur     Clear Clear  Leukocytes,UA     Negative Negative  Protein,UA      Negative/Trace 2+ (A)  Glucose, UA     Negative Negative  Ketones, UA     Negative Negative  RBC, UA  Negative 1+ (A)  Bilirubin, UA     Negative Negative  Urobilinogen, Ur     0.2 - 1.0 mg/dL 0.2  Nitrite, UA     Negative Negative  Microscopic Examination      See below:   Component     Latest Ref Rng & Units 12/08/2020  WBC, UA     0 - 5 /hpf 0-5  RBC     0 - 2 /hpf 0-2  Epithelial Cells (non renal)     0 - 10 /hpf 0-10  Casts     None seen /lpf Present (A)  Cast Type     N/A Hyaline casts  Bacteria, UA     None seen/Few None seen   Specimen:  Blood  Ref Range & Units 12 d ago  WBC (White Blood Cell Count) 4.1 - 10.2 10^3/uL 4.5   RBC (Red Blood Cell Count) 4.69 - 6.13 10^6/uL 4.34Low   Hemoglobin 14.1 - 18.1 gm/dL 13.3Low   Hematocrit 40.0 - 52.0 % 40.8   MCV (Mean Corpuscular Volume) 80.0 - 100.0 fl 94.0   MCH (Mean Corpuscular Hemoglobin) 27.0 - 31.2 pg 30.6   MCHC (Mean Corpuscular Hemoglobin Concentration) 32.0 - 36.0 gm/dL 32.6   Platelet Count 150 - 450 10^3/uL 190   RDW-CV (Red Cell Distribution Width) 11.6 - 14.8 % 14.3   MPV (Mean Platelet Volume) 9.4 - 12.4 fl 10.7   Neutrophils 1.50 - 7.80 10^3/uL 2.10   Lymphocytes 1.00 - 3.60 10^3/uL 2.10   Mixed Count 0.10 - 0.90 10^3/uL 0.30   Neutrophil % 32.0 - 70.0 % 48.4   Lymphocyte % 10.0 - 50.0 % 45.8   Mixed % 3.0 - 14.4 % 5.8   Resulting Agency  [facility] - LAB  Specimen Collected: 12/03/20 10:48 AM Last Resulted: 12/03/20 11:55 AM  Received From: [facility]  Result Received: 12/10/20 2:05 PM   Specimen:  Blood  Ref Range & Units 12 d ago  Glucose 70 - 110 mg/dL 86   Sodium 136 - 145 mmol/L 143   Potassium 3.6 - 5.1 mmol/L 3.9   Chloride 97 - 109 mmol/L 104   Carbon Dioxide (CO2) 22.0 - 32.0 mmol/L 28.8   Calcium 8.7 - 10.3 mg/dL 9.7   Urea Nitrogen (BUN) 7 - 25 mg/dL 18   Creatinine 0.7 - 1.3 mg/dL 1.6High   Glomerular Filtration Rate (eGFR), MDRD  Estimate >60 mL/min/1.73sq m 41Low   BUN/Crea Ratio 6.0 - 20.0 11.3   Anion Gap w/K 6.0 - 16.0 14.1   Resulting Agency  [facility] - LAB  Specimen Collected: 12/03/20 10:48 AM Last Resulted: 12/03/20 5:41 PM  Received From: [facility]  Result Received: 12/10/20 2:05 PM   Specimen:  Blood  Ref Range & Units 1 mo ago  PSA (Prostate Specific Antigen), Total 0.10 - 4.00 ng/mL 15.[place]   Narrative Performed by [facility] - LAB Test results were determined with [facility] Hybritech Assay. Values obtained with different assay methods cannot be used interchangeably in serial testing. Assay results should not be interpreted as absolute evidence of the presence or absence of malignant disease Specimen Collected: 10/28/20 2:59 PM Last Resulted: 10/28/20 6:05 PM  Received From: [place]  Result Received: 12/10/20 2:05 PM  I have reviewed the labs.   Pertinent Imaging: KUB stone remains in the proximal ureter I have independently reviewed  the films.    Assessment & Plan:    1. Right ureteral stone -patient with uncontrolled pain after ESWL -stone still present on KUB -30 mg IM of Toradol given in the office  -would like to pursue stent placement with possible URS at this time  - schedule right ureteroscopy with laser lithotripsy and ureteral stent placement - explained to the patient how the procedure is performed and the risks involved - informed patient that they will have a stent placed during the procedure and will remain in place after the procedure for a short time.  - stent may be removed in the office with a cystoscope or patient may be instructed to remove the stent themselves by the string - described "stent pain" as feelings of needing to urinate/overactive bladder and a warm, tingling sensation to intense pain in the affected flank - residual stones within the  kidney or ureter may be present after the procedure and may need to have these addressed at a different encounter - injury to the ureter is the most common intra-operative risk, it may result in an open procedure to correct the defect - infection and bleeding are also risks - explained the risks of general anesthesia, such as: MI, CVA, paralysis, coma and/or death. - advised to contact our office or seek treatment in the ED if becomes febrile or pain/ vomiting are difficult control in order to arrange for emergent/urgent intervention -I did explain that if there is concern for infected urine that only the stent will be placed and the stone would need to be addressed at a separate encounter  -given a script for Dilaudid 2 mg, # 10, q 4 prn for pain   Return for right URS/LL/ureteral stent placement .  These notes generated with voice recognition software. I apologize for typographical errors.  Zara Council, PA-C  [facility] [address]  [place], [place] [address] [phone]

## 2020-12-15 NOTE — Telephone Encounter (Signed)
Pt. LVM stating that he was in a lot of pain since having a procedure last week and he would like a call.

## 2020-12-15 NOTE — H&P (View-Only) (Signed)
12/15/2020 9:18 PM   Jack [name] [birth year] [id number]  Referring provider: [name], MD [phone],  [place] [address]  Chief Complaint  Patient presents with  . Follow-up    Post ESWL    HPI: Jack [name] is a 87 y.o. who is status post ESWL who presents today for follow up.  Underwent ESWL on 12/12/2018 for a 1.5 cm right proximal ureteral stone with Dr. Bernardo Heater.  He states that he states that  since ESWL last week he has had very bad lower right back pain. He tried Hydrocodone the first day, but it did not help with the pain.  He states he switched to extra strength tylenol on Saturday and this seemed to help more but his pain is pretty consistent. He states he is a little better today but mostly very uncomfortable. He isn't on flomax, but states he is pushing fluids.   Pain is 7/10 in the right flank.   He has not passed any fragments.     KUB stone remains in the proximal ureter.   UA 6-10 RBC's  PMH: Past Medical History:  Diagnosis Date  . Elevated prostate specific antigen (PSA)    has been 7 for a year   . GERD (gastroesophageal reflux disease)   . History of colon polyps 2008   [facility],   . History of kidney stones   . Hyperlipidemia   . Hypertension   . Prostate hypertrophy    diagnosed at age 44 due to hematospermia    Surgical History: Past Surgical History:  Procedure Laterality Date  . CATARACT EXTRACTION W/PHACO Left 01/10/2018   Procedure: CATARACT EXTRACTION PHACO AND INTRAOCULAR LENS PLACEMENT (IOC);  Surgeon: Birder Robson, MD;  Location: [facility] ORS;  Service: Ophthalmology;  Laterality: Left;  [name] 00:24.8 AP% 14.9 CDE 3.68 Fluid pack lot # [id number]  . CATARACT EXTRACTION W/PHACO Right 01/25/2018   Procedure: CATARACT EXTRACTION PHACO AND INTRAOCULAR LENS PLACEMENT (IOC);  Surgeon: Birder Robson, MD;  Location: [facility] ORS;  Service: Ophthalmology;  Laterality: Right;  [name] 00:42 AP% 10.8 CDE 4.59 Fluid pack lot  # [id number]  . COLON SURGERY    . CYSTOSCOPY W/ URETERAL STENT PLACEMENT Right 10/16/2017   Procedure: right  URETERAL STENT PLACEMENT,cystoscopy bilateral stent removal,rretrograde;  Surgeon: Abbie Sons, MD;  Location: [facility] ORS;  Service: Urology;  Laterality: Right;  . EXTRACORPOREAL SHOCK WAVE LITHOTRIPSY Right 12/11/2020   Procedure: EXTRACORPOREAL SHOCK WAVE LITHOTRIPSY (ESWL);  Surgeon: Abbie Sons, MD;  Location: [facility] ORS;  Service: Urology;  Laterality: Right;  . KIDNEY STONE SURGERY    . RESECTION SOFT TISSUE TUMOR LEG / ANKLE RADICAL  jan 2009   [facility],  right thigh/knee , nonmalignant  . SMALL INTESTINE SURGERY  1946   implaed on picket fence, punctured stomach  . TONSILLECTOMY      Home Medications:  Current Outpatient Medications on File Prior to Visit  Medication Sig Dispense Refill  . Ascorbic Acid (VITAMIN C) 1000 MG tablet Take 1,000 mg by mouth daily.    . beta carotene 25000 UNIT capsule Take 25,000 Units by mouth daily.    . cholecalciferol (VITAMIN D) 1000 UNITS tablet Take 1,000 Units by mouth daily.    . Cyanocobalamin (VITAMIN B-12 PO) Take 1 tablet by mouth daily.    [name] losartan-hydrochlorothiazide (HYZAAR) 50-12.5 MG per tablet Take 1 tablet by mouth daily. 30 tablet 0  . magnesium oxide (MAG-OX) 400 MG tablet Take by mouth.    [name]  Misc Natural Products (OSTEO BI-FLEX ADV DOUBLE ST PO) Take 1 tablet by mouth 2 (two) times daily.     . Multiple Vitamins-Minerals (PRESERVISION AREDS 2 PO) Take 1 capsule by mouth 2 (two) times daily.     . Omega-3 Fatty Acids (FISH OIL PO) Take 1 capsule by mouth daily.     [name] thiamine (VITAMIN B-1) 50 MG tablet Take 50 mg by mouth daily.    . trospium (SANCTURA) 20 MG tablet TAKE 1 TABLET BY MOUTH TWICE A DAY 180 tablet 1  . Turmeric 500 MG CAPS Take 500 mg by mouth 2 (two) times daily.     . Zinc 50 MG CAPS Take 50 mg by mouth daily.      No current facility-administered medications on file prior to visit.    Allergies:   Allergies  Allergen Reactions  . Amlodipine Other (See Comments)    LE edema  . Azithromycin Nausea And Vomiting  . Lisinopril Cough    Family History: Family History  Problem Relation Age of Onset  . Hypertension Father   . Hyperlipidemia Father   . Cancer Sister        thyroid - dx in late 20's    Social History:  reports that he quit smoking about 55 years ago. He has never used smokeless tobacco. He reports current alcohol use of about 1.0 standard drink of alcohol per week. He reports that he does not use drugs.  ROS: Pertinent ROS in HPI  Physical Exam: BP (!) 161/101   Pulse 91   Ht '5\' 9"'  (1.753 m)   Wt 206 lb (93.4 kg)   BMI 30.42 kg/m   Constitutional:  Well nourished. Alert and oriented, No acute distress. HEENT: [place] AT, mask in place.   Trachea midline. Cardiovascular: No clubbing, cyanosis, or edema. Respiratory: Normal respiratory effort, no increased work of breathing. Neurologic: Grossly intact, no focal deficits, moving all 4 extremities. Psychiatric: Normal mood and affect.  Laboratory Data: Component     Latest Ref Rng & Units 12/15/2020  Color, Urine     YELLOW YELLOW  Appearance     CLEAR CLEAR  Specific Gravity, Urine     1.005 - 1.030 1.010  pH     5.0 - 8.0 5.5  Glucose, UA     NEGATIVE mg/dL NEGATIVE  Hgb urine dipstick     NEGATIVE MODERATE (A)  Bilirubin Urine     NEGATIVE NEGATIVE  Ketones, ur     NEGATIVE mg/dL NEGATIVE  Protein     NEGATIVE mg/dL 100 (A)  Nitrite     NEGATIVE NEGATIVE  Leukocytes,Ua     NEGATIVE TRACE (A)  Squamous Epithelial / LPF     0 - 5 NONE SEEN  WBC, UA     0 - 5 WBC/hpf 0-5  RBC / HPF     0 - 5 RBC/hpf 6-10  Bacteria, UA     NONE SEEN NONE SEEN   Component     Latest Ref Rng & Units 12/08/2020  Specific Gravity, UA     1.005 - 1.030 1.020  pH, UA     5.0 - 7.5 6.0  Color, UA     Yellow Yellow  Appearance Ur     Clear Clear  Leukocytes,UA     Negative Negative  Protein,UA      Negative/Trace 2+ (A)  Glucose, UA     Negative Negative  Ketones, UA     Negative Negative  RBC, UA  Negative 1+ (A)  Bilirubin, UA     Negative Negative  Urobilinogen, Ur     0.2 - 1.0 mg/dL 0.2  Nitrite, UA     Negative Negative  Microscopic Examination      See below:   Component     Latest Ref Rng & Units 12/08/2020  WBC, UA     0 - 5 /hpf 0-5  RBC     0 - 2 /hpf 0-2  Epithelial Cells (non renal)     0 - 10 /hpf 0-10  Casts     None seen /lpf Present (A)  Cast Type     N/A Hyaline casts  Bacteria, UA     None seen/Few None seen   Specimen:  Blood  Ref Range & Units 12 d ago  WBC (White Blood Cell Count) 4.1 - 10.2 10^3/uL 4.5   RBC (Red Blood Cell Count) 4.69 - 6.13 10^6/uL 4.34Low   Hemoglobin 14.1 - 18.1 gm/dL 13.3Low   Hematocrit 40.0 - 52.0 % 40.8   MCV (Mean Corpuscular Volume) 80.0 - 100.0 fl 94.0   MCH (Mean Corpuscular Hemoglobin) 27.0 - 31.2 pg 30.6   MCHC (Mean Corpuscular Hemoglobin Concentration) 32.0 - 36.0 gm/dL 32.6   Platelet Count 150 - 450 10^3/uL 190   RDW-CV (Red Cell Distribution Width) 11.6 - 14.8 % 14.3   MPV (Mean Platelet Volume) 9.4 - 12.4 fl 10.7   Neutrophils 1.50 - 7.80 10^3/uL 2.10   Lymphocytes 1.00 - 3.60 10^3/uL 2.10   Mixed Count 0.10 - 0.90 10^3/uL 0.30   Neutrophil % 32.0 - 70.0 % 48.4   Lymphocyte % 10.0 - 50.0 % 45.8   Mixed % 3.0 - 14.4 % 5.8   Resulting Agency  [facility] - LAB  Specimen Collected: 12/03/20 10:48 AM Last Resulted: 12/03/20 11:55 AM  Received From: [place] [facility]  Result Received: 12/10/20 2:05 PM   Specimen:  Blood  Ref Range & Units 12 d ago  Glucose 70 - 110 mg/dL 86   Sodium 136 - 145 mmol/L 143   Potassium 3.6 - 5.1 mmol/L 3.9   Chloride 97 - 109 mmol/L 104   Carbon Dioxide (CO2) 22.0 - 32.0 mmol/L 28.8   Calcium 8.7 - 10.3 mg/dL 9.7   Urea Nitrogen (BUN) 7 - 25 mg/dL 18   Creatinine 0.7 - 1.3 mg/dL 1.6High   Glomerular Filtration Rate (eGFR), MDRD  Estimate >60 mL/min/1.73sq m 41Low   BUN/Crea Ratio 6.0 - 20.0 11.3   Anion Gap w/K 6.0 - 16.0 14.1   Resulting Agency  [facility] - [facility]  Specimen Collected: 12/03/20 10:48 AM Last Resulted: 12/03/20 5:41 PM  Received From: [facility]  Result Received: 12/10/20 2:05 PM   Specimen:  Blood  Ref Range & Units 1 mo ago  PSA (Prostate Specific Antigen), Total 0.10 - 4.00 ng/mL 15.[place]   Narrative Performed by [facility] - LAB Test results were determined with [facility] Hybritech Assay. Values obtained with different assay methods cannot be used interchangeably in serial testing. Assay results should not be interpreted as absolute evidence of the presence or absence of malignant disease Specimen Collected: 10/28/20 2:59 PM Last Resulted: 10/28/20 6:05 PM  Received From: [place]  Result Received: 12/10/20 2:05 PM  I have reviewed the labs.   Pertinent Imaging: KUB stone remains in the proximal ureter I have independently reviewed  the films.    Assessment & Plan:    1. Right ureteral stone -patient with uncontrolled pain after ESWL -stone still present on KUB -30 mg IM of Toradol given in the office  -would like to pursue stent placement with possible URS at this time  - schedule right ureteroscopy with laser lithotripsy and ureteral stent placement - explained to the patient how the procedure is performed and the risks involved - informed patient that they will have a stent placed during the procedure and will remain in place after the procedure for a short time.  - stent may be removed in the office with a cystoscope or patient may be instructed to remove the stent themselves by the string - described "stent pain" as feelings of needing to urinate/overactive bladder and a warm, tingling sensation to intense pain in the affected flank - residual stones within the  kidney or ureter may be present after the procedure and may need to have these addressed at a different encounter - injury to the ureter is the most common intra-operative risk, it may result in an open procedure to correct the defect - infection and bleeding are also risks - explained the risks of general anesthesia, such as: MI, CVA, paralysis, coma and/or death. - advised to contact our office or seek treatment in the ED if becomes febrile or pain/ vomiting are difficult control in order to arrange for emergent/urgent intervention -I did explain that if there is concern for infected urine that only the stent will be placed and the stone would need to be addressed at a separate encounter  -given a script for Dilaudid 2 mg, # 10, q 4 prn for pain   Return for right URS/LL/ureteral stent placement .  These notes generated with voice recognition software. I apologize for typographical errors.  Zara Council, PA-C  [facility] [address]  [place], [place] [address] [phone]

## 2020-12-15 NOTE — Telephone Encounter (Signed)
Patient states that  since ESWL last week he has had very bad lower right back pain. He tried Hydrocodone the first day but it did not help with the pain.  He states he switched to extra strength tylenol on Saturday and this seemed to help more but his pain is pretty consistent. He states he is a little better today but mostly very uncomfortable. He isn't on flomax, but states he is pushing fluids.  Spoke with Zara Council, PAC he was added to her schedule today for further evaluation w/KUB prior. Patient verbalized understanding

## 2020-12-16 ENCOUNTER — Encounter
Admission: RE | Disposition: A | Discharge status: Home / Self Care | Payer: Self-pay | Source: Home / Self Care | Attending: Urology

## 2020-12-16 ENCOUNTER — Telehealth: Payer: Self-pay | Admitting: Urology

## 2020-12-16 ENCOUNTER — Ambulatory Visit: Payer: PPO | Admitting: Anesthesiology

## 2020-12-16 ENCOUNTER — Encounter: Payer: Self-pay | Admitting: Urology

## 2020-12-16 ENCOUNTER — Ambulatory Visit
Admission: RE | Admit: 2020-12-16 | Discharge: 2020-12-16 | Disposition: A | Discharge status: Home / Self Care | Payer: PPO | Attending: Urology | Admitting: Urology

## 2020-12-16 ENCOUNTER — Ambulatory Visit: Payer: PPO

## 2020-12-16 DIAGNOSIS — N200 Calculus of kidney: Secondary | ICD-10-CM | POA: Diagnosis not present

## 2020-12-16 DIAGNOSIS — Z87891 Personal history of nicotine dependence: Secondary | ICD-10-CM | POA: Insufficient documentation

## 2020-12-16 DIAGNOSIS — Z87442 Personal history of urinary calculi: Secondary | ICD-10-CM | POA: Diagnosis not present

## 2020-12-16 DIAGNOSIS — I10 Essential (primary) hypertension: Secondary | ICD-10-CM | POA: Diagnosis not present

## 2020-12-16 DIAGNOSIS — Z20822 Contact with and (suspected) exposure to covid-19: Secondary | ICD-10-CM | POA: Insufficient documentation

## 2020-12-16 DIAGNOSIS — N201 Calculus of ureter: Secondary | ICD-10-CM

## 2020-12-16 DIAGNOSIS — R972 Elevated prostate specific antigen [PSA]: Secondary | ICD-10-CM | POA: Diagnosis not present

## 2020-12-16 DIAGNOSIS — E781 Pure hyperglyceridemia: Secondary | ICD-10-CM | POA: Diagnosis not present

## 2020-12-16 DIAGNOSIS — Z808 Family history of malignant neoplasm of other organs or systems: Secondary | ICD-10-CM | POA: Diagnosis not present

## 2020-12-16 DIAGNOSIS — Z8349 Family history of other endocrine, nutritional and metabolic diseases: Secondary | ICD-10-CM | POA: Diagnosis not present

## 2020-12-16 DIAGNOSIS — Z888 Allergy status to other drugs, medicaments and biological substances status: Secondary | ICD-10-CM | POA: Diagnosis not present

## 2020-12-16 DIAGNOSIS — Z8249 Family history of ischemic heart disease and other diseases of the circulatory system: Secondary | ICD-10-CM | POA: Diagnosis not present

## 2020-12-16 DIAGNOSIS — K219 Gastro-esophageal reflux disease without esophagitis: Secondary | ICD-10-CM | POA: Diagnosis not present

## 2020-12-16 DIAGNOSIS — Z79899 Other long term (current) drug therapy: Secondary | ICD-10-CM | POA: Insufficient documentation

## 2020-12-16 DIAGNOSIS — Z881 Allergy status to other antibiotic agents status: Secondary | ICD-10-CM | POA: Insufficient documentation

## 2020-12-16 HISTORY — PX: CYSTOSCOPY/URETEROSCOPY/HOLMIUM LASER/STENT PLACEMENT: SHX6546

## 2020-12-16 LAB — SARS CORONAVIRUS 2 BY RT PCR (HOSPITAL ORDER, PERFORMED IN ~~LOC~~ HOSPITAL LAB): SARS Coronavirus 2: NEGATIVE

## 2020-12-16 SURGERY — CYSTOSCOPY/URETEROSCOPY/HOLMIUM LASER/STENT PLACEMENT
Anesthesia: General | Laterality: Right

## 2020-12-16 MED ORDER — EPHEDRINE 5 MG/ML INJ
INTRAVENOUS | Status: AC
  Filled 2020-12-16: qty 10

## 2020-12-16 MED ORDER — LIDOCAINE HCL (CARDIAC) PF 100 MG/5ML IV SOSY
PREFILLED_SYRINGE | INTRAVENOUS | Status: DC | PRN
  Administered 2020-12-16: 100 mg via INTRAVENOUS

## 2020-12-16 MED ORDER — LACTATED RINGERS IV SOLN: INTRAVENOUS | Status: DC

## 2020-12-16 MED ORDER — IOHEXOL 180 MG/ML  SOLN
INTRAMUSCULAR | Status: DC | PRN
  Administered 2020-12-16: 55 mL

## 2020-12-16 MED ORDER — ACETAMINOPHEN 10 MG/ML IV SOLN
INTRAVENOUS | Status: DC | PRN
  Administered 2020-12-16: 1000 mg via INTRAVENOUS

## 2020-12-16 MED ORDER — DEXMEDETOMIDINE (PRECEDEX) IN NS 20 MCG/5ML (4 MCG/ML) IV SYRINGE
PREFILLED_SYRINGE | INTRAVENOUS | Status: DC | PRN
  Administered 2020-12-16: 8 ug via INTRAVENOUS
  Administered 2020-12-16: 12 ug via INTRAVENOUS

## 2020-12-16 MED ORDER — ORAL CARE MOUTH RINSE: 15.0000 mL | Freq: Once | OROMUCOSAL | Status: AC

## 2020-12-16 MED ORDER — FENTANYL CITRATE (PF) 100 MCG/2ML IJ SOLN
INTRAMUSCULAR | Status: DC | PRN
  Administered 2020-12-16 (×2): 25 ug via INTRAVENOUS

## 2020-12-16 MED ORDER — PROPOFOL 10 MG/ML IV BOLUS
INTRAVENOUS | Status: AC
  Filled 2020-12-16: qty 20

## 2020-12-16 MED ORDER — CEFAZOLIN SODIUM-DEXTROSE 2-4 GM/100ML-% IV SOLN
INTRAVENOUS | Status: AC
  Filled 2020-12-16: qty 100

## 2020-12-16 MED ORDER — CEFAZOLIN SODIUM-DEXTROSE 2-4 GM/100ML-% IV SOLN
2.0000 g | INTRAVENOUS | Status: AC
  Administered 2020-12-16: 2 g via INTRAVENOUS

## 2020-12-16 MED ORDER — CHLORHEXIDINE GLUCONATE 0.12 % MT SOLN
OROMUCOSAL | Status: AC
  Filled 2020-12-16: qty 15

## 2020-12-16 MED ORDER — PHENYLEPHRINE HCL (PRESSORS) 10 MG/ML IV SOLN
INTRAVENOUS | Status: DC | PRN
  Administered 2020-12-16: 50 ug via INTRAVENOUS
  Administered 2020-12-16: 100 ug via INTRAVENOUS

## 2020-12-16 MED ORDER — DEXAMETHASONE SODIUM PHOSPHATE 10 MG/ML IJ SOLN
INTRAMUSCULAR | Status: DC | PRN
  Administered 2020-12-16: 5 mg via INTRAVENOUS

## 2020-12-16 MED ORDER — FENTANYL CITRATE (PF) 100 MCG/2ML IJ SOLN
INTRAMUSCULAR | Status: AC
  Filled 2020-12-16: qty 2

## 2020-12-16 MED ORDER — DEXMEDETOMIDINE (PRECEDEX) IN NS 20 MCG/5ML (4 MCG/ML) IV SYRINGE
PREFILLED_SYRINGE | INTRAVENOUS | Status: AC
  Filled 2020-12-16: qty 5

## 2020-12-16 MED ORDER — ONDANSETRON HCL 4 MG/2ML IJ SOLN
INTRAMUSCULAR | Status: DC | PRN
  Administered 2020-12-16: 4 mg via INTRAVENOUS

## 2020-12-16 MED ORDER — ACETAMINOPHEN 10 MG/ML IV SOLN
INTRAVENOUS | Status: AC
  Filled 2020-12-16: qty 100

## 2020-12-16 MED ORDER — CHLORHEXIDINE GLUCONATE 0.12 % MT SOLN
15.0000 mL | Freq: Once | OROMUCOSAL | Status: AC
  Administered 2020-12-16: 15 mL via OROMUCOSAL

## 2020-12-16 MED ORDER — PROPOFOL 10 MG/ML IV BOLUS
INTRAVENOUS | Status: DC | PRN
  Administered 2020-12-16: 40 mg via INTRAVENOUS
  Administered 2020-12-16: 130 mg via INTRAVENOUS

## 2020-12-16 SURGICAL SUPPLY — 30 items
BAG DRAIN CYSTO-URO LG1000N (MISCELLANEOUS) ×2 IMPLANT
BASKET ZERO TIP 1.9FR (BASKET) ×1 IMPLANT
BRUSH SCRUB EZ 1% IODOPHOR (MISCELLANEOUS) ×2 IMPLANT
BSKT STON RTRVL ZERO TP 1.9FR (BASKET) ×1
CATH URET FLEX-TIP 2 LUMEN 10F (CATHETERS) IMPLANT
CATH URETL 5X70 OPEN END (CATHETERS) IMPLANT
CNTNR SPEC 2.5X3XGRAD LEK (MISCELLANEOUS) ×1
CONT SPEC 4OZ STER OR WHT (MISCELLANEOUS) ×1
CONT SPEC 4OZ STRL OR WHT (MISCELLANEOUS) ×1
CONTAINER SPEC 2.5X3XGRAD LEK (MISCELLANEOUS) IMPLANT
DRAPE UTILITY 15X26 TOWEL STRL (DRAPES) ×2 IMPLANT
GLOVE SURG UNDER POLY LF SZ7.5 (GLOVE) ×2 IMPLANT
GOWN STRL REUS W/ TWL LRG LVL3 (GOWN DISPOSABLE) ×1 IMPLANT
GOWN STRL REUS W/ TWL XL LVL3 (GOWN DISPOSABLE) ×1 IMPLANT
GOWN STRL REUS W/TWL LRG LVL3 (GOWN DISPOSABLE) ×2
GOWN STRL REUS W/TWL XL LVL3 (GOWN DISPOSABLE) ×2
GUIDEWIRE STR DUAL SENSOR (WIRE) ×3 IMPLANT
INFUSOR MANOMETER BAG 3000ML (MISCELLANEOUS) ×2 IMPLANT
IV NS IRRIG 3000ML ARTHROMATIC (IV SOLUTION) ×2 IMPLANT
KIT TURNOVER CYSTO (KITS) ×2 IMPLANT
PACK CYSTO AR (MISCELLANEOUS) ×2 IMPLANT
SET CYSTO W/LG BORE CLAMP LF (SET/KITS/TRAYS/PACK) ×2 IMPLANT
SHEATH URETERAL 12FR 45CM (SHEATH) ×1 IMPLANT
SHEATH URETERAL 12FRX35CM (MISCELLANEOUS) IMPLANT
STENT URET 6FRX24 CONTOUR (STENTS) ×1 IMPLANT
STENT URET 6FRX26 CONTOUR (STENTS) IMPLANT
SURGILUBE 2OZ TUBE FLIPTOP (MISCELLANEOUS) ×2 IMPLANT
TRACTIP FLEXIVA PULSE ID 200 (Laser) ×2 IMPLANT
VALVE UROSEAL ADJ ENDO (VALVE) IMPLANT
WATER STERILE IRR 1000ML POUR (IV SOLUTION) ×2 IMPLANT

## 2020-12-16 NOTE — Progress Notes (Signed)
Pt BP elevated. Dr. Ronelle Nigh notified. Stated pt could go home with Diastolic BP of under 001. No new orders.

## 2020-12-16 NOTE — Anesthesia Preprocedure Evaluation (Signed)
Anesthesia Evaluation  Patient identified by MRN, date of birth, ID band Patient awake    Reviewed: Allergy & Precautions, H&P , NPO status , Patient's Chart, lab work & pertinent test results, reviewed documented beta blocker date and time   Airway Mallampati: III  TM Distance: >3 FB Neck ROM: full    Dental  (+) Teeth Intact   Pulmonary neg pulmonary ROS, former smoker,    Pulmonary exam normal        Cardiovascular Exercise Tolerance: Good hypertension, On Medications negative cardio ROS Normal cardiovascular exam Rhythm:regular Rate:Normal     Neuro/Psych negative neurological ROS  negative psych ROS   GI/Hepatic Neg liver ROS, GERD  Medicated,  Endo/Other  negative endocrine ROS  Renal/GU Renal disease  negative genitourinary   Musculoskeletal   Abdominal   Peds  Hematology negative hematology ROS (+)   Anesthesia Other Findings Past Medical History: No date: Elevated prostate specific antigen (PSA)     Comment:  has been 9 for a year  No date: GERD (gastroesophageal reflux disease) 2008: History of colon polyps     Comment:  [facility],  No date: History of kidney stones No date: Hyperlipidemia No date: Hypertension No date: 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 Past Surgical History: 01/10/2018: CATARACT EXTRACTION W/PHACO; Left     Comment:  Procedure: CATARACT EXTRACTION PHACO AND INTRAOCULAR               LENS PLACEMENT (IOC);  Surgeon: Birder Robson, MD;                Location: [facility] ORS;  Service: Ophthalmology;  Laterality:              Left;  [name] 00:24.8 AP% 14.9 CDE 3.68 Fluid pack lot #               [id number] 01/25/2018: CATARACT EXTRACTION W/PHACO; Right     Comment:  Procedure: CATARACT EXTRACTION PHACO AND INTRAOCULAR               LENS PLACEMENT (IOC);  Surgeon: Birder Robson, MD;                Location: [facility] OR[facility];  Service:  Ophthalmology;  Laterality:              Right;  [name] 00:42 AP% 10.8 CDE 4.59 Fluid pack lot #               [id number] No date: COLON SURGERY 10/16/2017: CYSTOSCOPY W/ URETERAL STENT PLACEMENT; Right     Comment:  Procedure: right  URETERAL STENT PLACEMENT,cystoscopy               bilateral stent removal,rretrograde;  Surgeon: Abbie Sons, MD;  Location: [facility] OR[facility];  Service: Urology;                Laterality: Right; 12/11/2020: EXTRACORPOREAL SHOCK WAVE LITHOTRIPSY; Right     Comment:  Procedure: EXTRACORPOREAL SHOCK WAVE LITHOTRIPSY (ESWL);              Surgeon: Abbie Sons, MD;  Location: [facility] OR[facility];                Service: Urology;  Laterality: Right; No date: KIDNEY STONE SURGERY jan 2009: RESECTION SOFT TISSUE TUMOR LEG / ANKLE RADICAL     Comment:  [facility],  right  thigh/knee , nonmalignant 1946: SMALL INTESTINE SURGERY     Comment:  implaed on picket fence, punctured stomach No date: TONSILLECTOMY BMI    Body Mass Index: 30.42 kg/m     Reproductive/Obstetrics negative OB ROS                             Anesthesia Physical Anesthesia Plan  ASA: III  Anesthesia Plan: General ETT   Post-op Pain Management:    Induction:   PONV Risk Score and Plan:   Airway Management Planned:   Additional Equipment:   Intra-op Plan:   Post-operative Plan:   Informed Consent: I have reviewed the patients History and Physical, chart, labs and discussed the procedure including the risks, benefits and alternatives for the proposed anesthesia with the patient or authorized representative who has indicated his/her understanding and acceptance.     Dental Advisory Given  Plan Discussed with: CRNA  Anesthesia Plan Comments:         Anesthesia Quick Evaluation

## 2020-12-16 NOTE — Telephone Encounter (Signed)
-----   Message from Abbie Sons, MD sent at 12/16/2020 12:43 PM EDT ----- Regarding: Follow-up Please schedule appointment for KUB and possible cystoscopy/stent removal 7-10 days

## 2020-12-16 NOTE — Telephone Encounter (Signed)
Appt made and sent patient a my chart message with details per his request.  Sharyn Lull

## 2020-12-16 NOTE — Anesthesia Postprocedure Evaluation (Signed)
Anesthesia Post Note  Patient: Jack [name]  Procedure(s) Performed: CYSTOSCOPY/URETEROSCOPY/HOLMIUM LASER/STENT PLACEMENT (Right )  Patient location during evaluation: PACU Anesthesia Type: General Level of consciousness: awake and alert Pain management: pain level controlled Vital Signs Assessment: post-procedure vital signs reviewed and stable Respiratory status: spontaneous breathing and respiratory function stable Cardiovascular status: stable Anesthetic complications: no   No complications documented.   Last Vitals:  Vitals:   12/16/20 0708 12/16/20 1206  BP: (!) 170/98 (!) 176/105  Pulse: 80 67  Resp: 12 14  Temp: 36.9 C (!) 36.3 C  SpO2: 100% 100%    Last Pain:  Vitals:   12/16/20 1206  TempSrc:   PainSc: 0-No pain                 Leigha Olberding K

## 2020-12-16 NOTE — Anesthesia Procedure Notes (Signed)
Procedure Name: LMA Insertion Date/Time: 12/16/2020 9:48 AM Performed by: Doreen Salvage, CRNA Pre-anesthesia Checklist: Patient identified, Patient being monitored, Timeout performed, Emergency Drugs available and Suction available Patient Re-evaluated:Patient Re-evaluated prior to induction Oxygen Delivery Method: Circle system utilized Preoxygenation: Pre-oxygenation with 100% oxygen Induction Type: IV induction Ventilation: Mask ventilation without difficulty LMA: LMA inserted LMA Size: 3.5 Tube type: Oral Number of attempts: 1 Placement Confirmation: positive ETCO2 and breath sounds checked- equal and bilateral Tube secured with: Tape Dental Injury: Teeth and Oropharynx as per pre-operative assessment

## 2020-12-16 NOTE — Op Note (Signed)
Preoperative diagnosis: Right proximal ureteral calculus  Postoperative diagnosis: Right renal pelvic calculus  Procedure:  1. Cystoscopy 2. Right ureteroscopy and stone removal 3. Ureteroscopic laser lithotripsy 4. Right ureteral stent placement (6FR/24 cm) 5. Right retrograde pyelography with interpretation  Surgeon: Nicki Reaper C. Diann Bangerter, M.D.  Anesthesia: General  Complications: None  Intraoperative findings:  1.  Cystoscopy  Urethra normal in caliber without stricture  Moderate lateral lobe enlargement prostate  Bladder with fine stone fragments at bladder base; no mucosal erythema, solid or papillary lesions 2.  Right retrograde pyelogram  demonstrated a filling defect within the right renal pelvis of a duplicated system consistent with the patient's known calculus without other abnormalities. 3.  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 Calculus fragments for analysis   Indication: ZIYAN SCHOON is a 87 y.o. s/p SWL of a right proximal ureteral calculus 1 week prior.  He saw Zara Council yesterday with complaints of continued right flank pain and KUB showed incomplete stone fragmentation.  He elected ureteroscopic removal and lieu of repeat shockwave lithotripsy.  After reviewing the management options for treatment, the patient elected to proceed with the above surgical procedure(s). We have discussed the potential benefits and risks of the procedure, side effects of the proposed treatment, the likelihood of the patient achieving the goals of the procedure, and any potential problems that might occur during the procedure or recuperation. Informed consent has been obtained.  Description of  procedure:  The patient was taken to the operating room and general anesthesia was induced.  The patient was placed in the dorsal lithotomy position, prepped and draped in the usual sterile fashion, and preoperative antibiotics were administered. A preoperative time-out was performed.   A 21 French cystoscope was lubricated, passed per urethra and advanced proximally into the bladder with findings as described above  Attention then turned to the right ureteral orifice and a ureteral catheter was used to intubate the ureteral orifice.  Omnipaque contrast was injected through the ureteral catheter and a retrograde pyelogram was performed with findings as dictated above.  A 0.038 guidewire was then placed through the cystoscope and advanced proximally, the wire was noted to advance into the upper pole moiety of a duplicated collecting system.  A 4.5 Fr semirigid ureteroscope was then advanced into the ureter up to the ureteral bifurcation at the mid ureter.  The guidewire was able to be pulled back and then advanced into the lower pole ureter and lower pole moiety of the collecting system.  Scattered ureteral fragments were identified as above and the larger fragment was placed in a basket and removed without difficulty.  A second guidewire was placed and the semirigid ureteroscope was removed.  A 12/14 French ureteral access sheath was then gently inserted over the working wire and advanced to the upper proximal ureter of the lower pole system without difficulty.  A single channel digital flexible ureteroscope was then advanced to the sheath and into the renal pelvis of the lower pole moiety.  Pyeloscopy was performed with findings as described above.  A 242 m holmium laser fiber was then placed through the ureteroscope and the remaining renal pelvic calculus was dusted at settings of 0.4/20 and increased to 0.4/40.  Due to partial fragmentation  from prior SWL several larger fragments chipped from the  main fragment and these required further fragmentation and to less than size of 1 mm fragments.  Once the renal pelvic calculus was sufficiently fragmented the ureteroscope was advanced into the lower pole calyx and these fragments were treated with noncontact laser lithotripsy at 6J/ 40 Hz via a "popcorn technique".  Retrograde pyelogram was performed through the ureteroscope and all calyces were examined.  No fragments estimated at >1 mm in size were noted.  A 6 FR/24 CM Contour ureteral stent was placed under fluoroscopic guidance.  The wire was then removed with an adequate stent curl noted in the renal pelvis as well as in the bladder.  The bladder was then emptied and the procedure ended.  The patient appeared to tolerate the procedure well and without complications.  After anesthetic reversal the patient was transported to the PACU in stable condition.    Plan:  He will be scheduled for stent removal in office ~ 7 days   John Giovanni, MD

## 2020-12-16 NOTE — Transfer of Care (Signed)
Immediate Anesthesia Transfer of Care Note  Patient: Jack [name]  Procedure(s) Performed: CYSTOSCOPY/URETEROSCOPY/HOLMIUM LASER/STENT PLACEMENT (Right )  Patient Location: PACU  Anesthesia Type:General  Level of Consciousness: drowsy  Airway & Oxygen Therapy: Patient Spontanous Breathing and Patient connected to face mask oxygen  Post-op Assessment: Report given to RN and Post -op Vital signs reviewed and stable  Post vital signs: Reviewed and stable  Last Vitals:  Vitals Value Taken Time  BP 180/109 12/16/20 1207  Temp 36.3 C 12/16/20 1206  Pulse 54 12/16/20 1211  Resp 15 12/16/20 1211  SpO2 100 % 12/16/20 1211  Vitals shown include unvalidated device data.  Last Pain:  Vitals:   12/16/20 1206  TempSrc:   PainSc: 0-No pain         Complications: No complications documented.

## 2020-12-16 NOTE — Discharge Instructions (Signed)
DISCHARGE INSTRUCTIONS FOR KIDNEY STONE/URETERAL STENT   MEDICATIONS:  1. Resume all your other meds from home.  2.  AZO (over-the-counter) can help with the burning/stinging when you urinate. 3.  Tamsulosin and trospium will help stent irritation  ACTIVITY:  1. May resume regular activities in 24 hours. 2. No driving while on narcotic pain medications  3. Drink plenty of water  4. Continue to walk at home - you can still get blood clots when you are at home, so keep active, but don't over do it.  5. May return to work/school tomorrow or when you feel ready    SIGNS/SYMPTOMS TO CALL:  Please call us if you have a fever greater than 101.5, uncontrolled nausea/vomiting, uncontrolled pain, dizziness, unable to urinate, excessively bloody urine, chest pain, shortness of breath, leg swelling, leg pain, or any other concerns or questions.   You can reach [name] at [phone].   FOLLOW-UP:  1. You we will be contacted for an office appointment for abdominal x-ray and possible stent removal in 7-10 days

## 2020-12-16 NOTE — Interval H&P Note (Signed)
History and Physical Interval Note:  CV: RRR Lungs: Clear  12/16/2020 9:33 AM  Jack [name]  has presented today for surgery, with the diagnosis of right ureteral stone.  The various methods of treatment have been discussed with the patient and family. After consideration of risks, benefits and other options for treatment, the patient has consented to  Procedure(s): CYSTOSCOPY/URETEROSCOPY/HOLMIUM LASER/STENT PLACEMENT (Right) as a surgical intervention.  The patient's history has been reviewed, patient examined, no change in status, stable for surgery.  I have reviewed the patient's chart and labs.  Questions were answered to the patient's satisfaction.     [name]

## 2020-12-17 ENCOUNTER — Telehealth: Payer: Self-pay | Admitting: Family Medicine

## 2020-12-17 ENCOUNTER — Encounter: Payer: Self-pay | Admitting: Urology

## 2020-12-17 LAB — URINE CULTURE: Culture: NO GROWTH

## 2020-12-17 NOTE — Progress Notes (Signed)
Patient has had severe pain since last night. Has been using home pain meds.  Spoke with dr Bernardo Heater nurse this morning r/t pain as well as constipation. Has been told to use mag citrate.  Instructed pt to call office back if does not have bowel movement despite mag citrate or if pain is not reduced by pain meds that he has.

## 2020-12-17 NOTE — Telephone Encounter (Signed)
Pt called back to speak to "nurse" that he spoke to earlier today. Pt states he did what was recommended but no better, still hasn't had BM, he says it's been hours.

## 2020-12-17 NOTE — Telephone Encounter (Signed)
Patient called and states he is in pain after surgery. He states he has been on pain medication for a couple weeks and has not had a BM in 4-5 days. I informed him that he may be constipated from the pain medication. I directed him to try a half of a bottle of Magnesium citrate to start and give it a few hours to work. I also suggested he take stool softeners after he has had a BM. Patient voiced understanding. He is to contact our office if he is not any better after going to the restroom.

## 2020-12-17 NOTE — Telephone Encounter (Signed)
Spoke to patient and he states he has drank the whole bottle of Magnesium Citrate and he also did a Fleets enema yesterday. I spoke to Dr. Bernardo Heater and he suggests he call his PCP. I informed patient and he states he will call his PCP.

## 2020-12-19 NOTE — Telephone Encounter (Signed)
Patient called back today stating that he is still having trouble with pain and BM. He states he has drank 3 bottles now of Mag Citrate and had some stool movement but not much. He did contact PCP and they sent in a suppository but he has not picked it up. He is worried that he won't have enough pain medication to get him through the weekend. He describes his pain as mostly lower back pain and severe pressure. It is possible that his constipation is aiding or the main cause of his current discomfort/pain. It was discussed that pain medication can cause constipation and should be used sparingly until he is able to have regular BM. Patient verbalized understanding and states he will try the suppository called in by PCP and contact them if he still does not have any stool relief.

## 2020-12-20 LAB — CALCULI, WITH PHOTOGRAPH (CLINICAL LAB)
Uric Acid Calculi: 100 %
Weight Calculi: 27 mg

## 2020-12-22 ENCOUNTER — Telehealth: Payer: Self-pay | Admitting: Urology

## 2020-12-22 NOTE — Telephone Encounter (Signed)
Patient states he is urination and he has had a bowel movement . Patient states he took Ibuprofen for pain and he states he is feeling better. No fever or chills. I advised him to call us back with any other questions .

## 2020-12-22 NOTE — Telephone Encounter (Signed)
Pt LVM stating that he is in pain is taking OTC pain med. On a regular basis and needs a call.

## 2020-12-25 ENCOUNTER — Other Ambulatory Visit: Payer: Self-pay

## 2020-12-25 ENCOUNTER — Ambulatory Visit: Payer: PPO | Admitting: Physician Assistant

## 2020-12-25 ENCOUNTER — Ambulatory Visit
Admission: RE | Admit: 2020-12-25 | Discharge: 2020-12-25 | Disposition: A | Discharge status: Home / Self Care | Payer: PPO | Source: Ambulatory Visit | Attending: Urology | Admitting: Urology

## 2020-12-25 ENCOUNTER — Ambulatory Visit (INDEPENDENT_AMBULATORY_CARE_PROVIDER_SITE_OTHER): Payer: PPO | Admitting: Urology

## 2020-12-25 ENCOUNTER — Encounter: Payer: Self-pay | Admitting: Urology

## 2020-12-25 VITALS — BP 175/114 | HR 89 | Ht 69.0 in | Wt 200.6 lb

## 2020-12-25 DIAGNOSIS — Z9889 Other specified postprocedural states: Secondary | ICD-10-CM

## 2020-12-25 DIAGNOSIS — Z87442 Personal history of urinary calculi: Secondary | ICD-10-CM | POA: Insufficient documentation

## 2020-12-25 DIAGNOSIS — N201 Calculus of ureter: Secondary | ICD-10-CM

## 2020-12-25 MED ORDER — LEVOFLOXACIN 500 MG PO TABS
500.0000 mg | ORAL_TABLET | Freq: Once | ORAL | Status: AC
  Administered 2020-12-25: 500 mg via ORAL

## 2020-12-25 NOTE — Patient Instructions (Addendum)
[facility] Instructions °[facility] ° °You will receive a box/kit in the mail that will have a urine jug and instructions in the kit.  When the box arrives you will need to call our office [phone] to schedule a LAB appointment. ° °You will need to do a 24hour urine and this should be done during the days that our office will be open.  For example any day from Sunday through Thursday. ° °If you take Vitamin C 100mg or greater please stop this 5 days prior to collection. ° °How to collect the urine sample: On the day you start the urine sample this 1st morning urine should NOT be collected.  For the rest of the day including all night urines should be collected.  On the next morning the 1st urine should be collected and then you will be finished with the urine collections. ° °You will need to bring the box with you on your LAB appointment day after urine has been collected and all instructions are complete in the box.  Your blood will be drawn and the box will be collected by our Lab employee to be sent off for analysis. ° °When urine and blood is complete you will need to schedule a follow up appointment for lab results. °

## 2020-12-27 NOTE — Progress Notes (Signed)
Indications: Patient is 87 y.o., who is s/p ureteroscopic removal of right renal pelvic calculus after failed SWL.  He has had significant problems with constipation.  The patient is presenting today for stent removal.  Procedure:  Flexible Cystoscopy with stent removal (28833)  Timeout was performed and the correct patient, procedure and participants were identified.    Description:  The patient was prepped and draped in the usual sterile fashion. Flexible cystosopy was performed.  The stent was visualized, grasped, and removed intact without difficulty. The patient tolerated the procedure well.  A single dose of oral antibiotics was given.  Complications:  None  Plan:  Stone analysis was 100% uric acid  Recommend a metabolic evaluation to include 24 urine study and blood work  Instructed to call for fever/flank pain post stent removal   John Giovanni, MD

## 2020-12-29 LAB — URINALYSIS, COMPLETE
Bilirubin, UA: NEGATIVE
Glucose, UA: NEGATIVE
Ketones, UA: NEGATIVE
Nitrite, UA: NEGATIVE
Specific Gravity, UA: 1.025 (ref 1.005–1.030)
Urobilinogen, Ur: 0.2 mg/dL (ref 0.2–1.0)
pH, UA: 5.5 (ref 5.0–7.5)

## 2020-12-29 LAB — MICROSCOPIC EXAMINATION

## 2020-12-31 ENCOUNTER — Encounter: Payer: Self-pay | Admitting: Urology

## 2020-12-31 ENCOUNTER — Telehealth: Payer: Self-pay | Admitting: Urology

## 2020-12-31 DIAGNOSIS — R1031 Right lower quadrant pain: Secondary | ICD-10-CM

## 2020-12-31 NOTE — Telephone Encounter (Signed)
In response to his MyChart message recommend scheduling noncontrast CT abdomen pelvis for further evaluation of his pain.  Order was entered.  Please find out exactly where his pain is located.

## 2020-12-31 NOTE — Telephone Encounter (Signed)
LMOM for patient to return call.

## 2020-12-31 NOTE — Telephone Encounter (Signed)
Patient states the pain is across the lower part of the back above the hips. He is still taking Ibuprofen and Tramadol. Patient states the pain is less today than it has been in weeks.

## 2021-01-01 ENCOUNTER — Encounter: Payer: Self-pay | Admitting: Urology

## 2021-01-01 NOTE — Telephone Encounter (Signed)
May be musculoskeletal however would recommend follow-up CT per prior note

## 2021-01-01 NOTE — Telephone Encounter (Signed)
Imaging is contacting patient to get CT scan scheduled.

## 2021-01-02 ENCOUNTER — Emergency Department (HOSPITAL_COMMUNITY): Payer: PPO

## 2021-01-02 ENCOUNTER — Other Ambulatory Visit: Payer: Self-pay

## 2021-01-02 ENCOUNTER — Emergency Department: Payer: PPO

## 2021-01-02 ENCOUNTER — Inpatient Hospital Stay
Admission: EM | Admit: 2021-01-02 | Discharge: 2021-01-07 | DRG: 543 | Disposition: A | Discharge status: Skilled Nursing Facility | Payer: PPO | Attending: Internal Medicine | Admitting: Internal Medicine

## 2021-01-02 ENCOUNTER — Encounter: Payer: Self-pay | Admitting: Urology

## 2021-01-02 DIAGNOSIS — M4854XD Collapsed vertebra, not elsewhere classified, thoracic region, subsequent encounter for fracture with routine healing: Secondary | ICD-10-CM | POA: Diagnosis not present

## 2021-01-02 DIAGNOSIS — I491 Atrial premature depolarization: Secondary | ICD-10-CM | POA: Diagnosis not present

## 2021-01-02 DIAGNOSIS — I499 Cardiac arrhythmia, unspecified: Secondary | ICD-10-CM | POA: Diagnosis not present

## 2021-01-02 DIAGNOSIS — Z8249 Family history of ischemic heart disease and other diseases of the circulatory system: Secondary | ICD-10-CM | POA: Diagnosis not present

## 2021-01-02 DIAGNOSIS — N1831 Chronic kidney disease, stage 3a: Secondary | ICD-10-CM | POA: Diagnosis not present

## 2021-01-02 DIAGNOSIS — D649 Anemia, unspecified: Secondary | ICD-10-CM | POA: Diagnosis not present

## 2021-01-02 DIAGNOSIS — M4854XA Collapsed vertebra, not elsewhere classified, thoracic region, initial encounter for fracture: Secondary | ICD-10-CM | POA: Diagnosis not present

## 2021-01-02 DIAGNOSIS — E860 Dehydration: Secondary | ICD-10-CM | POA: Diagnosis not present

## 2021-01-02 DIAGNOSIS — I340 Nonrheumatic mitral (valve) insufficiency: Secondary | ICD-10-CM | POA: Diagnosis not present

## 2021-01-02 DIAGNOSIS — N1832 Chronic kidney disease, stage 3b: Secondary | ICD-10-CM | POA: Diagnosis present

## 2021-01-02 DIAGNOSIS — D631 Anemia in chronic kidney disease: Secondary | ICD-10-CM | POA: Diagnosis present

## 2021-01-02 DIAGNOSIS — N179 Acute kidney failure, unspecified: Secondary | ICD-10-CM

## 2021-01-02 DIAGNOSIS — E785 Hyperlipidemia, unspecified: Secondary | ICD-10-CM | POA: Diagnosis present

## 2021-01-02 DIAGNOSIS — R5381 Other malaise: Secondary | ICD-10-CM | POA: Diagnosis not present

## 2021-01-02 DIAGNOSIS — R279 Unspecified lack of coordination: Secondary | ICD-10-CM | POA: Diagnosis not present

## 2021-01-02 DIAGNOSIS — I4891 Unspecified atrial fibrillation: Secondary | ICD-10-CM | POA: Diagnosis not present

## 2021-01-02 DIAGNOSIS — G6289 Other specified polyneuropathies: Secondary | ICD-10-CM | POA: Diagnosis present

## 2021-01-02 DIAGNOSIS — Z20822 Contact with and (suspected) exposure to covid-19: Secondary | ICD-10-CM | POA: Diagnosis not present

## 2021-01-02 DIAGNOSIS — Z83438 Family history of other disorder of lipoprotein metabolism and other lipidemia: Secondary | ICD-10-CM | POA: Diagnosis not present

## 2021-01-02 DIAGNOSIS — M545 Low back pain, unspecified: Secondary | ICD-10-CM | POA: Diagnosis not present

## 2021-01-02 DIAGNOSIS — I700 Atherosclerosis of aorta: Secondary | ICD-10-CM | POA: Diagnosis present

## 2021-01-02 DIAGNOSIS — N200 Calculus of kidney: Secondary | ICD-10-CM | POA: Diagnosis not present

## 2021-01-02 DIAGNOSIS — Z888 Allergy status to other drugs, medicaments and biological substances status: Secondary | ICD-10-CM | POA: Diagnosis not present

## 2021-01-02 DIAGNOSIS — R2681 Unsteadiness on feet: Secondary | ICD-10-CM | POA: Diagnosis not present

## 2021-01-02 DIAGNOSIS — S22080A Wedge compression fracture of T11-T12 vertebra, initial encounter for closed fracture: Secondary | ICD-10-CM

## 2021-01-02 DIAGNOSIS — N400 Enlarged prostate without lower urinary tract symptoms: Secondary | ICD-10-CM | POA: Diagnosis not present

## 2021-01-02 DIAGNOSIS — N201 Calculus of ureter: Secondary | ICD-10-CM | POA: Diagnosis present

## 2021-01-02 DIAGNOSIS — N189 Chronic kidney disease, unspecified: Secondary | ICD-10-CM

## 2021-01-02 DIAGNOSIS — Z881 Allergy status to other antibiotic agents status: Secondary | ICD-10-CM | POA: Diagnosis not present

## 2021-01-02 DIAGNOSIS — S22080D Wedge compression fracture of T11-T12 vertebra, subsequent encounter for fracture with routine healing: Secondary | ICD-10-CM | POA: Diagnosis not present

## 2021-01-02 DIAGNOSIS — K449 Diaphragmatic hernia without obstruction or gangrene: Secondary | ICD-10-CM | POA: Diagnosis not present

## 2021-01-02 DIAGNOSIS — R002 Palpitations: Secondary | ICD-10-CM | POA: Diagnosis not present

## 2021-01-02 DIAGNOSIS — K219 Gastro-esophageal reflux disease without esophagitis: Secondary | ICD-10-CM | POA: Diagnosis present

## 2021-01-02 DIAGNOSIS — I129 Hypertensive chronic kidney disease with stage 1 through stage 4 chronic kidney disease, or unspecified chronic kidney disease: Secondary | ICD-10-CM | POA: Diagnosis not present

## 2021-01-02 DIAGNOSIS — Z87891 Personal history of nicotine dependence: Secondary | ICD-10-CM | POA: Diagnosis not present

## 2021-01-02 DIAGNOSIS — M62838 Other muscle spasm: Secondary | ICD-10-CM | POA: Diagnosis not present

## 2021-01-02 DIAGNOSIS — N401 Enlarged prostate with lower urinary tract symptoms: Secondary | ICD-10-CM | POA: Diagnosis not present

## 2021-01-02 DIAGNOSIS — Z8719 Personal history of other diseases of the digestive system: Secondary | ICD-10-CM | POA: Diagnosis not present

## 2021-01-02 DIAGNOSIS — E876 Hypokalemia: Secondary | ICD-10-CM | POA: Diagnosis not present

## 2021-01-02 DIAGNOSIS — M5489 Other dorsalgia: Secondary | ICD-10-CM | POA: Diagnosis not present

## 2021-01-02 DIAGNOSIS — I2584 Coronary atherosclerosis due to calcified coronary lesion: Secondary | ICD-10-CM | POA: Diagnosis not present

## 2021-01-02 DIAGNOSIS — Z79899 Other long term (current) drug therapy: Secondary | ICD-10-CM

## 2021-01-02 DIAGNOSIS — R972 Elevated prostate specific antigen [PSA]: Secondary | ICD-10-CM | POA: Diagnosis not present

## 2021-01-02 DIAGNOSIS — I2510 Atherosclerotic heart disease of native coronary artery without angina pectoris: Secondary | ICD-10-CM | POA: Diagnosis not present

## 2021-01-02 DIAGNOSIS — I10 Essential (primary) hypertension: Secondary | ICD-10-CM | POA: Diagnosis present

## 2021-01-02 DIAGNOSIS — M6281 Muscle weakness (generalized): Secondary | ICD-10-CM | POA: Diagnosis not present

## 2021-01-02 DIAGNOSIS — Z87442 Personal history of urinary calculi: Secondary | ICD-10-CM | POA: Diagnosis not present

## 2021-01-02 DIAGNOSIS — I471 Supraventricular tachycardia: Secondary | ICD-10-CM | POA: Diagnosis not present

## 2021-01-02 DIAGNOSIS — R52 Pain, unspecified: Secondary | ICD-10-CM | POA: Diagnosis not present

## 2021-01-02 DIAGNOSIS — I708 Atherosclerosis of other arteries: Secondary | ICD-10-CM | POA: Diagnosis not present

## 2021-01-02 DIAGNOSIS — S22080S Wedge compression fracture of T11-T12 vertebra, sequela: Secondary | ICD-10-CM | POA: Diagnosis present

## 2021-01-02 DIAGNOSIS — I77819 Aortic ectasia, unspecified site: Secondary | ICD-10-CM | POA: Diagnosis present

## 2021-01-02 HISTORY — DX: Disorder of kidney and ureter, unspecified: N28.9

## 2021-01-02 LAB — CBC WITH DIFFERENTIAL/PLATELET
Abs Immature Granulocytes: 0.01 10*3/uL (ref 0.00–0.07)
Basophils Absolute: 0 10*3/uL (ref 0.0–0.1)
Basophils Relative: 0 %
Eosinophils Absolute: 0.1 10*3/uL (ref 0.0–0.5)
Eosinophils Relative: 2 %
HCT: 37.9 % — ABNORMAL LOW (ref 39.0–52.0)
Hemoglobin: 12.8 g/dL — ABNORMAL LOW (ref 13.0–17.0)
Immature Granulocytes: 0 %
Lymphocytes Relative: 39 %
Lymphs Abs: 1.5 10*3/uL (ref 0.7–4.0)
MCH: 30.3 pg (ref 26.0–34.0)
MCHC: 33.8 g/dL (ref 30.0–36.0)
MCV: 89.6 fL (ref 80.0–100.0)
Monocytes Absolute: 0.2 10*3/uL (ref 0.1–1.0)
Monocytes Relative: 6 %
Neutro Abs: 2 10*3/uL (ref 1.7–7.7)
Neutrophils Relative %: 53 %
Platelets: 206 10*3/uL (ref 150–400)
RBC: 4.23 MIL/uL (ref 4.22–5.81)
RDW: 13.9 % (ref 11.5–15.5)
WBC: 3.7 10*3/uL — ABNORMAL LOW (ref 4.0–10.5)
nRBC: 0 % (ref 0.0–0.2)

## 2021-01-02 LAB — RESP PANEL BY RT-PCR (FLU A&B, COVID) ARPGX2
Influenza A by PCR: NEGATIVE
Influenza B by PCR: NEGATIVE
SARS Coronavirus 2 by RT PCR: NEGATIVE

## 2021-01-02 LAB — COMPREHENSIVE METABOLIC PANEL
ALT: 11 U/L (ref 0–44)
AST: 28 U/L (ref 15–41)
Albumin: 4.2 g/dL (ref 3.5–5.0)
Alkaline Phosphatase: 106 U/L (ref 38–126)
Anion gap: 13 (ref 5–15)
BUN: 34 mg/dL — ABNORMAL HIGH (ref 8–23)
CO2: 24 mmol/L (ref 22–32)
Calcium: 9.9 mg/dL (ref 8.9–10.3)
Chloride: 98 mmol/L (ref 98–111)
Creatinine, Ser: 2.17 mg/dL — ABNORMAL HIGH (ref 0.61–1.24)
GFR, Estimated: 29 mL/min — ABNORMAL LOW (ref >60)
Glucose, Bld: 116 mg/dL — ABNORMAL HIGH (ref 70–99)
Potassium: 3.5 mmol/L (ref 3.5–5.1)
Sodium: 135 mmol/L (ref 135–145)
Total Bilirubin: 1 mg/dL (ref 0.3–1.2)
Total Protein: 6.6 g/dL (ref 6.5–8.1)

## 2021-01-02 LAB — URINALYSIS, COMPLETE (UACMP) WITH MICROSCOPIC
Bilirubin Urine: NEGATIVE
Glucose, UA: NEGATIVE mg/dL
Hgb urine dipstick: NEGATIVE
Ketones, ur: NEGATIVE mg/dL
Leukocytes,Ua: NEGATIVE
Nitrite: NEGATIVE
Protein, ur: 100 mg/dL — AB
Specific Gravity, Urine: 1.014 (ref 1.005–1.030)
pH: 8 (ref 5.0–8.0)

## 2021-01-02 LAB — PROTIME-INR
INR: 1.1 (ref 0.8–1.2)
Prothrombin Time: 14.2 seconds (ref 11.4–15.2)

## 2021-01-02 MED ORDER — ONDANSETRON HCL 4 MG/2ML IJ SOLN
4.0000 mg | Freq: Once | INTRAMUSCULAR | Status: AC
  Administered 2021-01-02: 4 mg via INTRAVENOUS
  Filled 2021-01-02: qty 2

## 2021-01-02 MED ORDER — VITAMIN B-12 100 MCG PO TABS
100.0000 ug | ORAL_TABLET | Freq: Every day | ORAL | Status: DC
  Administered 2021-01-03 – 2021-01-07 (×5): 100 ug via ORAL
  Filled 2021-01-02 (×6): qty 1

## 2021-01-02 MED ORDER — HYDRALAZINE HCL 25 MG PO TABS
25.0000 mg | ORAL_TABLET | Freq: Three times a day (TID) | ORAL | Status: DC
  Administered 2021-01-02 – 2021-01-07 (×15): 25 mg via ORAL
  Filled 2021-01-02 (×15): qty 1

## 2021-01-02 MED ORDER — MORPHINE SULFATE (PF) 4 MG/ML IV SOLN
4.0000 mg | INTRAVENOUS | Status: DC | PRN
  Administered 2021-01-02: 4 mg via INTRAVENOUS
  Filled 2021-01-02: qty 1

## 2021-01-02 MED ORDER — HYDROMORPHONE HCL 1 MG/ML IJ SOLN
1.0000 mg | Freq: Once | INTRAMUSCULAR | Status: AC
  Administered 2021-01-02: 1 mg via INTRAVENOUS
  Filled 2021-01-02: qty 1

## 2021-01-02 MED ORDER — BETA CAROTENE 25000 UNITS PO CAPS: 25000.0000 [IU] | ORAL_CAPSULE | Freq: Every day | ORAL | Status: DC

## 2021-01-02 MED ORDER — LABETALOL HCL 5 MG/ML IV SOLN
20.0000 mg | Freq: Once | INTRAVENOUS | Status: AC
  Administered 2021-01-02: 20 mg via INTRAVENOUS
  Filled 2021-01-02: qty 4

## 2021-01-02 MED ORDER — OXYCODONE-ACETAMINOPHEN 5-325 MG PO TABS
1.0000 | ORAL_TABLET | ORAL | Status: DC | PRN
Start: 2021-01-02 — End: 2021-01-07
  Administered 2021-01-03 – 2021-01-07 (×20): 1 via ORAL
  Filled 2021-01-02 (×21): qty 1

## 2021-01-02 MED ORDER — ZINC SULFATE 220 (50 ZN) MG PO CAPS
220.0000 mg | ORAL_CAPSULE | Freq: Every day | ORAL | Status: DC
  Administered 2021-01-03 – 2021-01-07 (×5): 220 mg via ORAL
  Filled 2021-01-02 (×6): qty 1

## 2021-01-02 MED ORDER — POLYETHYLENE GLYCOL 3350 17 G PO PACK
17.0000 g | PACK | Freq: Every day | ORAL | Status: DC | PRN
  Administered 2021-01-05 – 2021-01-06 (×2): 17 g via ORAL
  Filled 2021-01-02 (×2): qty 1

## 2021-01-02 MED ORDER — METHOCARBAMOL 500 MG PO TABS
500.0000 mg | ORAL_TABLET | Freq: Three times a day (TID) | ORAL | Status: DC | PRN
  Administered 2021-01-03 – 2021-01-05 (×4): 500 mg via ORAL
  Filled 2021-01-02 (×5): qty 1

## 2021-01-02 MED ORDER — SODIUM CHLORIDE 0.9 % IV BOLUS
1000.0000 mL | Freq: Once | INTRAVENOUS | Status: AC
  Administered 2021-01-02: 1000 mL via INTRAVENOUS

## 2021-01-02 MED ORDER — OSTEO BI-FLEX ADV DOUBLE ST PO TABS: 1.0000 | ORAL_TABLET | Freq: Two times a day (BID) | ORAL | Status: DC

## 2021-01-02 MED ORDER — TURMERIC 500 MG PO CAPS: 500.0000 mg | ORAL_CAPSULE | Freq: Two times a day (BID) | ORAL | Status: DC

## 2021-01-02 MED ORDER — TAMSULOSIN HCL 0.4 MG PO CAPS
0.4000 mg | ORAL_CAPSULE | Freq: Every day | ORAL | Status: DC
  Administered 2021-01-02 – 2021-01-07 (×6): 0.4 mg via ORAL
  Filled 2021-01-02 (×6): qty 1

## 2021-01-02 MED ORDER — OCUVITE-LUTEIN PO CAPS
1.0000 | ORAL_CAPSULE | Freq: Two times a day (BID) | ORAL | Status: DC
  Administered 2021-01-02 – 2021-01-07 (×9): 1 via ORAL
  Filled 2021-01-02 (×12): qty 1

## 2021-01-02 MED ORDER — MAGNESIUM OXIDE 400 (241.3 MG) MG PO TABS
400.0000 mg | ORAL_TABLET | Freq: Every day | ORAL | Status: DC
  Administered 2021-01-02 – 2021-01-07 (×6): 400 mg via ORAL
  Filled 2021-01-02 (×7): qty 1

## 2021-01-02 MED ORDER — MORPHINE SULFATE (PF) 2 MG/ML IV SOLN: 0.5000 mg | INTRAVENOUS | Status: DC | PRN

## 2021-01-02 MED ORDER — SENNA 8.6 MG PO TABS
1.0000 | ORAL_TABLET | Freq: Every day | ORAL | Status: DC
  Administered 2021-01-02 – 2021-01-06 (×5): 8.6 mg via ORAL
  Filled 2021-01-02 (×5): qty 1

## 2021-01-02 MED ORDER — ONDANSETRON HCL 4 MG/2ML IJ SOLN
4.0000 mg | Freq: Three times a day (TID) | INTRAMUSCULAR | Status: DC | PRN
  Administered 2021-01-02 – 2021-01-04 (×2): 4 mg via INTRAVENOUS
  Filled 2021-01-02 (×2): qty 2

## 2021-01-02 MED ORDER — HEPARIN SODIUM (PORCINE) 5000 UNIT/ML IJ SOLN
5000.0000 [IU] | Freq: Three times a day (TID) | INTRAMUSCULAR | Status: DC
  Administered 2021-01-02 – 2021-01-07 (×15): 5000 [IU] via SUBCUTANEOUS
  Filled 2021-01-02 (×15): qty 1

## 2021-01-02 MED ORDER — ASCORBIC ACID 500 MG PO TABS
1000.0000 mg | ORAL_TABLET | Freq: Every day | ORAL | Status: DC
  Administered 2021-01-03 – 2021-01-07 (×5): 1000 mg via ORAL
  Filled 2021-01-02 (×5): qty 2

## 2021-01-02 MED ORDER — MORPHINE SULFATE (PF) 2 MG/ML IV SOLN
1.0000 mg | INTRAVENOUS | Status: DC | PRN
  Administered 2021-01-02 – 2021-01-05 (×6): 1 mg via INTRAVENOUS
  Filled 2021-01-02 (×6): qty 1

## 2021-01-02 MED ORDER — SODIUM CHLORIDE 0.9 % IV SOLN: INTRAVENOUS | Status: DC

## 2021-01-02 MED ORDER — HYDRALAZINE HCL 20 MG/ML IJ SOLN
5.0000 mg | INTRAMUSCULAR | Status: DC | PRN
  Administered 2021-01-03: 5 mg via INTRAVENOUS
  Filled 2021-01-02: qty 1

## 2021-01-02 MED ORDER — VITAMIN D 25 MCG (1000 UNIT) PO TABS
1000.0000 [IU] | ORAL_TABLET | Freq: Every day | ORAL | Status: DC
  Administered 2021-01-03 – 2021-01-07 (×5): 1000 [IU] via ORAL
  Filled 2021-01-02 (×5): qty 1

## 2021-01-02 MED ORDER — ACETAMINOPHEN 325 MG PO TABS: 650.0000 mg | ORAL_TABLET | Freq: Four times a day (QID) | ORAL | Status: DC | PRN

## 2021-01-02 NOTE — Progress Notes (Signed)
PHARMACIST - PHYSICIAN ORDER COMMUNICATION  CONCERNING: P&T Medication Policy on Herbal Medications  DESCRIPTION:  This patient's order for:  Osteo Bioflex, Tumeric, Beta Carotene  has been noted.  This product(s) is classified as an "herbal" or natural product. Due to a lack of definitive safety studies or FDA approval, nonstandard manufacturing practices, plus the potential risk of unknown drug-drug interactions while on inpatient medications, the Pharmacy and Therapeutics Committee does not permit the use of "herbal" or natural products of this type within [facility]

## 2021-01-02 NOTE — ED Notes (Signed)
Pt presents to ED with c/o of R sided flank pain that started about 7-10 days ago after a lithotripsy and a stent placed. Pt states HX of kidney stones. Pt denies issues with urination. Pt denies fevers or chills. Pt is A&Ox4. Pt presents hypertensive and states he has a HX of this but did not take his morning BP meds. Pt appears uncomfortable at this time.

## 2021-01-02 NOTE — H&P (Signed)
History and Physical    Jack [name] RFF:[id number] DOB: [birth year] DOA: 01/02/2021  Referring MD/NP/PA:   PCP: [name], MD   Patient coming from:  The patient is coming from home.  At baseline, pt is independent for most of ADL.        Chief Complaint: right flank and mid back pain  HPI: Jack [name] is a 87 y.o. male with medical history significant of hypertension, hyperlipidemia, GERD, CKD stage IIIa, BPH, kidney stone, who presents with right flank and mid back pain.  Pt underwent ESWL on 12/12/2018 for a 1.5 cm right proximal ureteral stone with Dr. Bernardo Heater. after the procedure, patient continued have right back pain. KUB showed that pt still had stone present. He then underwent cystoscopy/urethral Skulskie/holmium laser/stent placement by Dr. Bernardo Heater on 12/16/20. Stent was already removed a weak ago.   Patient states that he continues to have pain in the right flank and mid of his back, associated with muscle spasms sometimes.  The pain is constant, sharp, 10 out of 10 severity, nonradiating.  Patient cannot walk normally due to severe pain.  No loss control of bladder or bowel movement.  No leg numbness. Patient has nausea, no vomiting, diarrhea or abdominal pain.  Patient is constipated.  Denies chest pain, cough, shortness of breath.  Denies symptoms of UTI.  No hematuria.  His urologist, Dr. Bernardo Heater recommended patient come to emergency room for CT scan.   ED Course: pt was found to have WBC 3.7, pending COVID-19 PCR, worsening renal function with creatinine 2.17, BUN 34 (baseline creatinine 1.5-1.6 recently), pending UA. temperature normal, blood pressure 173/95, heart rate 95, 69, RR 20, oxygen saturation 99% on room air. Patient is placed on MedSurg bed for observation. CT scan showed new T12 compression fracture with mild retropulsion but no significant canal compromise. Dr. Lacinda Axon of neurosurgery will be consulted.  CT-renal stone: 1. Small lower pole right renal  calculus but no obstructing ureteral calculi or bladder calculi. 2. No worrisome renal or bladder lesions without contrast. 3. No acute abdominal/pelvic findings, mass lesions or adenopathy. 4. Stable advanced atherosclerotic calcifications involving the aorta and branch vessels. 5. New T12 compression fracture with mild retropulsion but no significant canal compromise. This may be responsible for the patient's back pain. 6. Stable moderate-sized hiatal hernia. 7. Stable prostate gland enlargement with median lobe hypertrophy impressing on the base of the bladder. 8. Aortic atherosclerosis.  Aortic Atherosclerosis (ICD10-I70.0).   Review of Systems:   General: no fevers, chills, no body weight gain, has fatigue HEENT: no blurry vision, hearing changes or sore throat Respiratory: no dyspnea, coughing, wheezing CV: no chest pain, no palpitations GI: has nausea, no vomiting, abdominal pain, diarrhea, has constipation GU: no dysuria, burning on urination, increased urinary frequency, hematuria  Ext: no leg edema Neuro: no unilateral weakness, numbness, or tingling, no vision change or hearing loss Skin: no rash, no skin tear. MSK: has right flank and mid back pain. Heme: No easy bruising.  Travel history: No recent long distant travel.  Allergy:  Allergies  Allergen Reactions  . Amlodipine Other (See Comments)    LE edema  . Azithromycin Nausea And Vomiting  . Lisinopril Cough    Past Medical History:  Diagnosis Date  . Elevated prostate specific antigen (PSA)    has been 7 for a year   . GERD (gastroesophageal reflux disease)   . History of colon polyps 2008   [facility],   . History of kidney  stones   . Hyperlipidemia   . Hypertension   . Prostate hypertrophy    diagnosed at age 44 due to hematospermia  . Renal disorder     Past Surgical History:  Procedure Laterality Date  . CATARACT EXTRACTION W/PHACO Left 01/10/2018   Procedure: CATARACT EXTRACTION  PHACO AND INTRAOCULAR LENS PLACEMENT (IOC);  Surgeon: Birder Robson, MD;  Location: [facility] ORS;  Service: Ophthalmology;  Laterality: Left;  [name] 00:24.8 AP% 14.9 CDE 3.68 Fluid pack lot # [id number]  . CATARACT EXTRACTION W/PHACO Right 01/25/2018   Procedure: CATARACT EXTRACTION PHACO AND INTRAOCULAR LENS PLACEMENT (IOC);  Surgeon: Birder Robson, MD;  Location: [facility] OR[facility];  Service: Ophthalmology;  Laterality: Right;  [name] 00:42 AP% 10.8 CDE 4.59 Fluid pack lot # [id number]  . COLON SURGERY    . CYSTOSCOPY W/ URETERAL STENT PLACEMENT Right 10/16/2017   Procedure: right  URETERAL STENT PLACEMENT,cystoscopy bilateral stent removal,rretrograde;  Surgeon: Abbie Sons, MD;  Location: [facility] OR[facility];  Service: Urology;  Laterality: Right;  . CYSTOSCOPY/URETEROSCOPY/HOLMIUM LASER/STENT PLACEMENT Right 12/16/2020   Procedure: CYSTOSCOPY/URETEROSCOPY/HOLMIUM LASER/STENT PLACEMENT;  Surgeon: Abbie Sons, MD;  Location: [facility] OR[facility];  Service: Urology;  Laterality: Right;  . EXTRACORPOREAL SHOCK WAVE LITHOTRIPSY Right 12/11/2020   Procedure: EXTRACORPOREAL SHOCK WAVE LITHOTRIPSY (ESWL);  Surgeon: Abbie Sons, MD;  Location: [facility] OR[facility];  Service: Urology;  Laterality: Right;  . KIDNEY STONE SURGERY    . RESECTION SOFT TISSUE TUMOR LEG / ANKLE RADICAL  jan 2009   [facility],  right thigh/knee , nonmalignant  . SMALL INTESTINE SURGERY  1946   implaed on picket fence, punctured stomach  . TONSILLECTOMY      Social History:  reports that he quit smoking about 55 years ago. He has never used smokeless tobacco. He reports current alcohol use of about 1.0 standard drink of alcohol per week. He reports that he does not use drugs.  Family History:  Family History  Problem Relation Age of Onset  . Hypertension Father   . Hyperlipidemia Father   . Cancer Sister        thyroid - dx in late 20's     Prior to Admission medications   Medication Sig Start Date End Date Taking? Authorizing Provider  Ascorbic Acid  (VITAMIN C) 1000 MG tablet Take 1,000 mg by mouth daily.    [provider]  beta carotene 25000 UNIT capsule Take 25,000 Units by mouth daily.    [provider]  cholecalciferol (VITAMIN D) 1000 UNITS tablet Take 1,000 Units by mouth daily.    [provider]  Cyanocobalamin (VITAMIN B-12 PO) Take 1 tablet by mouth daily.    [provider]  HYDROmorphone (DILAUDID) 2 MG tablet Take 1 tablet (2 mg total) by mouth every 4 (four) hours as needed for severe pain. Patient not taking: Reported on 12/25/2020 12/15/20   Zara Council A, PA-C  losartan-hydrochlorothiazide (HYZAAR) 50-12.5 MG per tablet Take 1 tablet by mouth daily. 09/16/14   [name], MD  magnesium oxide (MAG-OX) 400 MG tablet Take by mouth.    [provider]  Misc Natural Products (OSTEO BI-FLEX ADV DOUBLE ST PO) Take 1 tablet by mouth 2 (two) times daily.     [provider]  Multiple Vitamins-Minerals (PRESERVISION AREDS 2 PO) Take 1 capsule by mouth 2 (two) times daily.     [provider]  Omega-3 Fatty Acids (FISH OIL PO) Take 1 capsule by mouth daily.     [provider]  tamsulosin (  FLOMAX) 0.4 MG CAPS capsule Take 1 capsule (0.4 mg total) by mouth daily. 12/15/20   Zara Council A, PA-C  thiamine (VITAMIN B-1) 50 MG tablet Take 50 mg by mouth daily.    [provider]  trospium (SANCTURA) 20 MG tablet TAKE 1 TABLET BY MOUTH TWICE A DAY 06/03/20   McGowan, Larene Beach A, PA-C  Turmeric 500 MG CAPS Take 500 mg by mouth 2 (two) times daily.     [provider]  Zinc 50 MG CAPS Take 50 mg by mouth daily.     [provider]    Physical Exam: Vitals:   01/02/21 0850 01/02/21 0853 01/02/21 0900 01/02/21 1000  BP: (!) 171/119  (!) 139/104 (!) 173/95  Pulse: 95  90 69  Resp: 20  19 18   Temp: 97.9 F (36.6 C)     TempSrc: Oral     SpO2: 99%  99% 100%  Weight:  88.5 kg    Height:  5\' 9"  (1.753 m)     General: Not in acute  distress HEENT:       Eyes: PERRL, EOMI, no scleral icterus.       ENT: No discharge from the ears and nose, no pharynx injection, no tonsillar enlargement.        Neck: No JVD, no bruit, no mass felt. Heme: No neck lymph node enlargement. Cardiac: S1/S2, RRR, No murmurs, No gallops or rubs. Respiratory: No rales, wheezing, rhonchi or rubs. GI: Soft, nondistended, nontender, no rebound pain, no organomegaly, BS present. GU: No hematuria Ext: No pitting leg edema bilaterally. 1+DP/PT pulse bilaterally. Musculoskeletal: has tenderness in midline of his mid back Skin: No rashes.  Neuro: Alert, oriented X3, cranial nerves II-XII grossly intact, moves all extremities normally. Psych: Patient is not psychotic, no suicidal or hemocidal ideation.  Labs on Admission: I have personally reviewed following labs and imaging studies  CBC: Recent Labs  Lab 01/02/21 0857  WBC 3.7*  NEUTROABS 2.0  HGB 12.8*  HCT 37.9*  MCV 89.6  PLT 893   Basic Metabolic Panel: Recent Labs  Lab 01/02/21 0857  NA 135  K 3.5  CL 98  CO2 24  GLUCOSE 116*  BUN 34*  CREATININE 2.17*  CALCIUM 9.9   GFR: Estimated Creatinine Clearance: 27.4 mL/min (A) (by C-G formula based on SCr of 2.17 mg/dL (H)). Liver Function Tests: Recent Labs  Lab 01/02/21 0857  AST 28  ALT 11  ALKPHOS 106  BILITOT 1.0  PROT 6.6  ALBUMIN 4.2   No results for input(s): LIPASE, AMYLASE in the last 168 hours. No results for input(s): AMMONIA in the last 168 hours. Coagulation Profile: No results for input(s): INR, PROTIME in the last 168 hours. Cardiac Enzymes: No results for input(s): CKTOTAL, CKMB, CKMBINDEX, TROPONINI in the last 168 hours. BNP (last 3 results) No results for input(s): PROBNP in the last 8760 hours. HbA1C: No results for input(s): HGBA1C in the last 72 hours. CBG: No results for input(s): GLUCAP in the last 168 hours. Lipid Profile: No results for input(s): CHOL, HDL, LDLCALC, TRIG, CHOLHDL,  LDLDIRECT in the last 72 hours. Thyroid Function Tests: No results for input(s): TSH, T4TOTAL, FREET4, T3FREE, THYROIDAB in the last 72 hours. Anemia Panel: No results for input(s): VITAMINB12, FOLATE, FERRITIN, TIBC, IRON, RETICCTPCT in the last 72 hours. Urine analysis:    Component Value Date/Time   COLORURINE STRAW (A) 01/02/2021 1133   APPEARANCEUR CLEAR (A) 01/02/2021 1133   APPEARANCEUR Clear 12/25/2020 1037  LABSPEC 1.014 01/02/2021 1133   PHURINE 8.0 01/02/2021 1133   GLUCOSEU NEGATIVE 01/02/2021 1133   HGBUR NEGATIVE 01/02/2021 1133   [place] 01/02/2021 1133   BILIRUBINUR Negative 12/25/2020 [place] 01/02/2021 1133   PROTEINUR 100 (A) 01/02/2021 1133   NITRITE NEGATIVE 01/02/2021 1133   LEUKOCYTESUR NEGATIVE 01/02/2021 1133   Sepsis Labs: @LABRCNTIP (procalcitonin:4,lacticidven:4) ) Recent Results (from the past 240 hour(s))  Microscopic Examination     Status: Abnormal   Collection Time: 12/25/20 10:37 AM   Urine  Result Value Ref Range Status   WBC, UA 6-10 (A) 0 - 5 /hpf Final   RBC 11-30 (A) 0 - 2 /hpf Final   Epithelial Cells (non renal) 0-10 0 - 10 /hpf Final   Casts Present (A) None seen /lpf Final   Cast Type Granular casts (A) N/A Final    Comment: Cellular casts   Bacteria, UA Few None seen/Few Final     Radiological Exams on Admission: CT Renal Stone Study  Result Date: 01/02/2021 CLINICAL DATA:  Right flank pain for 7-10 days.  Recent lithotripsy. EXAM: CT ABDOMEN AND PELVIS WITHOUT CONTRAST TECHNIQUE: Multidetector CT imaging of the abdomen and pelvis was performed following the standard protocol without IV contrast. COMPARISON:  CT scan 12/08/2020 FINDINGS: Lower chest: The lung bases are clear of acute process. No pleural effusion or pulmonary lesions. The heart is normal in size. No pericardial effusion. Stable aortic and coronary artery calcifications. Stable moderate-sized hiatal hernia. Hepatobiliary: No hepatic  lesions or intrahepatic biliary dilatation. The gallbladder is unremarkable. No common bile duct dilatation. Pancreas: No mass, inflammation or ductal dilatation. Spleen: Normal size.  No focal lesions. Adrenals/Urinary Tract: Adrenal glands are unremarkable and stable. Small lower pole right renal calculus but no obstructing ureteral calculi or bladder calculi. The large UPJ calculus seen on the prior CT scan has been removed. No left-sided renal or ureteral calculi. No worrisome renal or bladder lesions without contrast. Stomach/Bowel: The stomach, duodenum, small bowel and colon are grossly normal without oral contrast. No inflammatory changes, mass lesions or obstructive findings. The appendix is normal. Vascular/Lymphatic: Stable tortuosity and advanced atherosclerotic calcifications involving the aorta common iliac arteries and branch vessels. No mesenteric or retroperitoneal mass or adenopathy. Reproductive: Stable prostate gland enlargement with median lobe hypertrophy impressing on the base of the bladder. The seminal vesicles are unremarkable. Other: No pelvic mass or adenopathy. No free pelvic fluid collections. No inguinal mass or adenopathy. No abdominal wall hernia or subcutaneous lesions. Surgical changes related to a prior left-sided inguinal hernia repair. No recurrent hernia. Musculoskeletal: Age related osteoporosis. There is a new T12 compression fracture with mild retropulsion involving the posterosuperior aspect of the vertebral body but no significant canal compromise. This may be responsible for the patient's back pain. IMPRESSION: 1. Small lower pole right renal calculus but no obstructing ureteral calculi or bladder calculi. 2. No worrisome renal or bladder lesions without contrast. 3. No acute abdominal/pelvic findings, mass lesions or adenopathy. 4. Stable advanced atherosclerotic calcifications involving the aorta and branch vessels. 5. New T12 compression fracture with mild retropulsion  but no significant canal compromise. This may be responsible for the patient's back pain. 6. Stable moderate-sized hiatal hernia. 7. Stable prostate gland enlargement with median lobe hypertrophy impressing on the base of the bladder. 8. Aortic atherosclerosis. Aortic Atherosclerosis (ICD10-I70.0). Electronically Signed   By: Marijo Sanes M.D.   On: 01/02/2021 10:13     EKG: Not done in ED, will get one.  Assessment/Plan Principal Problem:   T12 compression fracture (HCC) Active Problems:   Essential hypertension   Prostate hypertrophy   Acute renal failure superimposed on stage 3a chronic kidney disease (HCC)   Ureteral stone   T12 compression fracture ([place]): No alarming symptoms.  Dr. Lacinda Axon of neurosurgery is consulted.  -will place in med-surg bed for obs - Pain control: morphine prn and percocet - When necessary Zofran for nausea - Robaxin for muscle spasm - TLSO - PT/OT  Essential hypertension: -Hold Hyzaar due to worsening renal function -Start hydralazine 25 mg 3 times daily orally -IV hydralazine as needed  Prostate hypertrophy -Flomax  History of ureteral stone: s/p of ESWL, and cystoscopy/urethral Skulskie/holmium laser/stent placement. Stent was already removed a weak ago.  CT showed small lower pole right renal calculus but no obstructing ureteral calculi or bladder calculi. -No new issues  Acute renal failure superimposed on stage 3a chronic kidney disease ([place]): Likely due to dehydration and continuation of his acute -Hold Hyzaar -IV fluid: Normal saline 75 cc/h         DVT ppx: SQ Heparin   Code Status: Full code Family Communication:  Yes, patient's wife at bed side Disposition Plan:  Anticipate discharge back to previous environment Consults called:  Neurosurgery, Dr. Lacinda Axon Admission status and Level of care: Med-Surg:    for obs   Status is: Observation  The patient remains OBS appropriate and will d/c before 2 midnights.  Dispo: The  patient is from: Home              Anticipated d/c is to: Home              Patient currently is not medically stable to d/c.   Difficult to place patient No          Date of Service 01/02/2021    [facility]   If 7PM-7AM, please contact night-coverage [email or web address] 01/02/2021, 12:43 PM

## 2021-01-02 NOTE — ED Notes (Signed)
Pt placed on 3L/min via [place]. Pt desats after Dilaudid admin, MD aware.

## 2021-01-02 NOTE — ED Provider Notes (Signed)
[facility] Emergency Department Provider Note  ____________________________________________  Time seen: Approximately 8:58 AM  I have reviewed the triage vital signs and the nursing notes.   HISTORY  Chief Complaint Flank Pain    HPI Jack [name] is a 87 y.o. male with a history of kidney stones, GERD, hypertension who comes ED complaining of severe right flank pain wrapping around his right abdomen.  He has had pain like this on and off for the past several weeks.  He was found to have kidney stones, underwent lithotripsy a few weeks ago which did not resolve the issue.  Had a ureteral stent placed afterward which was removed several days ago, however since then he continues to have severe unremitting pain.  No fever or vomiting.  No diarrhea chest pain or shortness of breath.  He was in touch with urology Dr. Bernardo Heater who recommended noncontrast CT scan.  Patient continues to have 10/10 pain without aggravating or alleviating factors.      Past Medical History:  Diagnosis Date  . Elevated prostate specific antigen (PSA)    has been 7 for a year   . GERD (gastroesophageal reflux disease)   . History of colon polyps 2008   [facility],   . History of kidney stones   . Hyperlipidemia   . Hypertension   . Prostate hypertrophy    diagnosed at age 57 due to hematospermia  . Renal disorder      Patient Active Problem List   Diagnosis Date Noted  . T12 compression fracture ([place]) 01/02/2021  . History of kidney stones   . Acute renal failure superimposed on stage 3a chronic kidney disease ([place])   . Ureteral stone   . Ureteral perforation secondary to stent manipulation 10/16/2017  . Abdominal pain 10/16/2017  . Multinodular goiter 07/03/2014  . Thyroid nodule 06/20/2014  . Edema 09/24/2013  . Encounter for preventive health examination 08/21/2013  . Personal history of colonic polyps 08/21/2013  . History of venomous snake bite 08/21/2013  .  Cough 10/02/2012  . Prostate hypertrophy   . Elevated prostate specific antigen (PSA)   . HYPERTRIGLYCERIDEMIA 12/21/2006  . Essential hypertension 12/21/2006  . GERD 12/21/2006  . CALCULUS, KIDNEY 12/21/2006     Past Surgical History:  Procedure Laterality Date  . CATARACT EXTRACTION W/PHACO Left 01/10/2018   Procedure: CATARACT EXTRACTION PHACO AND INTRAOCULAR LENS PLACEMENT (IOC);  Surgeon: Birder Robson, MD;  Location: [facility] ORS;  Service: Ophthalmology;  Laterality: Left;  [name] 00:24.8 AP% 14.9 CDE 3.68 Fluid pack lot # [id number]  . CATARACT EXTRACTION W/PHACO Right 01/25/2018   Procedure: CATARACT EXTRACTION PHACO AND INTRAOCULAR LENS PLACEMENT (IOC);  Surgeon: Birder Robson, MD;  Location: [facility] ORS;  Service: Ophthalmology;  Laterality: Right;  [name] 00:42 AP% 10.8 CDE 4.59 Fluid pack lot # [id number]  . COLON SURGERY    . CYSTOSCOPY W/ URETERAL STENT PLACEMENT Right 10/16/2017   Procedure: right  URETERAL STENT PLACEMENT,cystoscopy bilateral stent removal,rretrograde;  Surgeon: Abbie Sons, MD;  Location: [facility] ORS;  Service: Urology;  Laterality: Right;  . CYSTOSCOPY/URETEROSCOPY/HOLMIUM LASER/STENT PLACEMENT Right 12/16/2020   Procedure: CYSTOSCOPY/URETEROSCOPY/HOLMIUM LASER/STENT PLACEMENT;  Surgeon: Abbie Sons, MD;  Location: [facility] ORS;  Service: Urology;  Laterality: Right;  . EXTRACORPOREAL SHOCK WAVE LITHOTRIPSY Right 12/11/2020   Procedure: EXTRACORPOREAL SHOCK WAVE LITHOTRIPSY (ESWL);  Surgeon: Abbie Sons, MD;  Location: [facility] ORS;  Service: Urology;  Laterality: Right;  . KIDNEY STONE SURGERY    . RESECTION SOFT TISSUE TUMOR  LEG / ANKLE RADICAL  jan 2009   [facility],  right thigh/knee , nonmalignant  . SMALL INTESTINE SURGERY  1946   implaed on picket fence, punctured stomach  . TONSILLECTOMY       Prior to Admission medications   Medication Sig Start Date End Date Taking? Authorizing Provider  Ascorbic Acid (VITAMIN C) 1000 MG tablet Take 1,000 mg by mouth  daily.   Yes [provider]  beta carotene 25000 UNIT capsule Take 25,000 Units by mouth daily.   Yes [provider]  cholecalciferol (VITAMIN D) 1000 UNITS tablet Take 1,000 Units by mouth daily.   Yes [provider]  Cyanocobalamin (VITAMIN B-12 PO) Take 1 tablet by mouth daily.   Yes [provider]  losartan-hydrochlorothiazide (HYZAAR) 50-12.5 MG per tablet Take 1 tablet by mouth daily. 09/16/14  Yes [name], MD  magnesium oxide (MAG-OX) 400 MG tablet Take by mouth.   Yes [provider]  Misc Natural Products (OSTEO BI-FLEX ADV DOUBLE ST PO) Take 1 tablet by mouth 2 (two) times daily.    Yes [provider]  Multiple Vitamins-Minerals (PRESERVISION AREDS 2 PO) Take 1 capsule by mouth 2 (two) times daily.    Yes [provider]  tamsulosin (FLOMAX) 0.4 MG CAPS capsule Take 1 capsule (0.4 mg total) by mouth daily. 12/15/20  Yes McGowan, Larene Beach A, PA-C  Turmeric 500 MG CAPS Take 500 mg by mouth 2 (two) times daily.    Yes [provider]  Zinc 50 MG CAPS Take 50 mg by mouth daily.    Yes [provider]  HYDROmorphone (DILAUDID) 2 MG tablet Take 1 tablet (2 mg total) by mouth every 4 (four) hours as needed for severe pain. Patient not taking: No sig reported 12/15/20   Zara Council A, PA-C  Omega-3 Fatty Acids (FISH OIL PO) Take 1 capsule by mouth daily.  Patient not taking: Reported on 01/02/2021    [provider]  thiamine (VITAMIN B-1) 50 MG tablet Take 50 mg by mouth daily. Patient not taking: Reported on 01/02/2021    [provider]  trospium (SANCTURA) 20 MG tablet TAKE 1 TABLET BY MOUTH TWICE A DAY 06/03/20   McGowan, Larene Beach A, PA-C     Allergies Amlodipine, Azithromycin, and Lisinopril   Family History  Problem Relation Age of Onset  . Hypertension Father   . Hyperlipidemia Father   . Cancer Sister        thyroid - dx in late 20's    Social History Social History    Tobacco Use  . Smoking status: Former Smoker    Quit date: 07/05/1965    Years since quitting: 55.5  . Smokeless tobacco: Never Used  Vaping Use  . Vaping Use: Never used  Substance Use Topics  . Alcohol use: Yes    Alcohol/week: 1.0 standard drink    Types: 1 Standard drinks or equivalent per week    Comment: occassionaly  . Drug use: No    Review of Systems  Constitutional:   No fever or chills.  ENT:   No sore throat. No rhinorrhea. Cardiovascular:   No chest pain or syncope. Respiratory:   No dyspnea or cough. Gastrointestinal:   Negative for abdominal pain, vomiting and diarrhea.  Musculoskeletal:   Severe right flank pain as above  All other systems reviewed and are negative except as documented above in ROS and HPI.  ____________________________________________   PHYSICAL EXAM:  VITAL SIGNS: ED Triage Vitals  Enc Vitals Group     BP 01/02/21 0850 (!) 171/119     Pulse Rate 01/02/21 0850 95     Resp 01/02/21 0850 20     Temp 01/02/21 0850 97.9 F (36.6 C)     Temp Source 01/02/21 0850 Oral     SpO2 01/02/21 0850 99 %     Weight 01/02/21 0853 195 lb (88.5 kg)     Height 01/02/21 0853 5\' 9"  (1.753 m)     Head Circumference --      Peak Flow --      Pain Score 01/02/21 0853 10     Pain Loc --      Pain Edu? --      Excl. in [place]? --     Vital signs reviewed, nursing assessments reviewed.   Constitutional:   Alert and oriented. Non-toxic appearance. Eyes:   Conjunctivae are normal. EOMI. PERRL. ENT      Head:   Normocephalic and atraumatic.      Nose:   Wearing a mask.      Mouth/Throat:   Wearing a mask.      Neck:   No meningismus. Full ROM. Hematological/Lymphatic/Immunilogical:   No cervical lymphadenopathy. Cardiovascular:   RRR. Symmetric bilateral radial and DP pulses.  No murmurs. Cap refill less than 2 seconds. Respiratory:   Normal respiratory effort without tachypnea/retractions. Breath sounds are clear and equal bilaterally. No  wheezes/rales/rhonchi. Gastrointestinal:   Soft and nontender. Non distended. There is no CVA tenderness.  No rebound, rigidity, or guarding.  Musculoskeletal:   Normal range of motion in all extremities. No joint effusions.  No lower extremity tenderness.  No edema. Neurologic:   Normal speech and language.  Motor grossly intact. No acute focal neurologic deficits are appreciated.  Skin:    Skin is warm, dry and intact. No rash noted.  No petechiae, purpura, or bullae.  ____________________________________________    LABS (pertinent positives/negatives) (all labs ordered are listed, but only abnormal results are displayed) Labs Reviewed  COMPREHENSIVE METABOLIC PANEL - Abnormal; Notable for the following components:      Result Value   Glucose, Bld 116 (*)    BUN 34 (*)    Creatinine, Ser 2.17 (*)    GFR, Estimated 29 (*)    All other components within normal limits  CBC WITH DIFFERENTIAL/PLATELET - Abnormal; Notable for the following components:   WBC 3.7 (*)    Hemoglobin 12.8 (*)    HCT 37.9 (*)    All other components within normal limits  URINALYSIS, COMPLETE (UACMP) WITH MICROSCOPIC - Abnormal; Notable for the following components:   Color, Urine STRAW (*)    APPearance CLEAR (*)    Protein, ur 100 (*)    Bacteria, UA RARE (*)    All other components within normal limits  RESP PANEL BY RT-PCR (FLU A&B, COVID) [id number]  URINE CULTURE  PROTIME-INR   ____________________________________________   EKG    ____________________________________________    RADIOLOGY  CT Renal Stone Study  Result Date: 01/02/2021 CLINICAL DATA:  Right flank pain for 7-10 days.  Recent lithotripsy. EXAM: CT ABDOMEN AND PELVIS WITHOUT CONTRAST TECHNIQUE: Multidetector CT imaging of the abdomen and pelvis was performed following the standard protocol without IV contrast. COMPARISON:  CT scan 12/08/2020 FINDINGS: Lower chest: The lung bases are clear of acute process. No pleural effusion or  pulmonary lesions. The heart is normal in size. No pericardial effusion. Stable aortic and coronary artery calcifications. Stable moderate-sized hiatal hernia.  Hepatobiliary: No hepatic lesions or intrahepatic biliary dilatation. The gallbladder is unremarkable. No common bile duct dilatation. Pancreas: No mass, inflammation or ductal dilatation. Spleen: Normal size.  No focal lesions. Adrenals/Urinary Tract: Adrenal glands are unremarkable and stable. Small lower pole right renal calculus but no obstructing ureteral calculi or bladder calculi. The large UPJ calculus seen on the prior CT scan has been removed. No left-sided renal or ureteral calculi. No worrisome renal or bladder lesions without contrast. Stomach/Bowel: The stomach, duodenum, small bowel and colon are grossly normal without oral contrast. No inflammatory changes, mass lesions or obstructive findings. The appendix is normal. Vascular/Lymphatic: Stable tortuosity and advanced atherosclerotic calcifications involving the aorta common iliac arteries and branch vessels. No mesenteric or retroperitoneal mass or adenopathy. Reproductive: Stable prostate gland enlargement with median lobe hypertrophy impressing on the base of the bladder. The seminal vesicles are unremarkable. Other: No pelvic mass or adenopathy. No free pelvic fluid collections. No inguinal mass or adenopathy. No abdominal wall hernia or subcutaneous lesions. Surgical changes related to a prior left-sided inguinal hernia repair. No recurrent hernia. Musculoskeletal: Age related osteoporosis. There is a new T12 compression fracture with mild retropulsion involving the posterosuperior aspect of the vertebral body but no significant canal compromise. This may be responsible for the patient's back pain. IMPRESSION: 1. Small lower pole right renal calculus but no obstructing ureteral calculi or bladder calculi. 2. No worrisome renal or bladder lesions without contrast. 3. No acute  abdominal/pelvic findings, mass lesions or adenopathy. 4. Stable advanced atherosclerotic calcifications involving the aorta and branch vessels. 5. New T12 compression fracture with mild retropulsion but no significant canal compromise. This may be responsible for the patient's back pain. 6. Stable moderate-sized hiatal hernia. 7. Stable prostate gland enlargement with median lobe hypertrophy impressing on the base of the bladder. 8. Aortic atherosclerosis. Aortic Atherosclerosis (ICD10-I70.0). Electronically Signed   By: Marijo Sanes M.D.   On: 01/02/2021 10:13    ____________________________________________   PROCEDURES Procedures  ____________________________________________  DIFFERENTIAL DIAGNOSIS   Obstructing kidney stone, cystitis, pyelonephritis, musculoskeletal pain  CLINICAL IMPRESSION / ASSESSMENT AND PLAN / ED COURSE  Medications ordered in the ED: Medications  oxyCODONE-acetaminophen (PERCOCET/ROXICET) 5-325 MG per tablet 1 tablet (has no administration in time range)  methocarbamol (ROBAXIN) tablet 500 mg (has no administration in time range)  ondansetron (ZOFRAN) injection 4 mg (has no administration in time range)  hydrALAZINE (APRESOLINE) injection 5 mg (has no administration in time range)  acetaminophen (TYLENOL) tablet 650 mg (has no administration in time range)  morphine 2 MG/ML injection 1 mg (has no administration in time range)  heparin injection 5,000 Units (has no administration in time range)  hydrALAZINE (APRESOLINE) tablet 25 mg (has no administration in time range)  polyethylene glycol (MIRALAX / GLYCOLAX) packet 17 g (has no administration in time range)  senna (SENOKOT) tablet 8.6 mg (has no administration in time range)  0.9 %  sodium chloride infusion (has no administration in time range)  sodium chloride 0.9 % bolus 1,000 mL (0 mLs Intravenous Stopped 01/02/21 1105)  HYDROmorphone (DILAUDID) injection 1 mg (1 mg Intravenous Given 01/02/21 0918)   ondansetron (ZOFRAN) injection 4 mg (4 mg Intravenous Given 01/02/21 0918)  labetalol (NORMODYNE) injection 20 mg (20 mg Intravenous Given 01/02/21 1140)    Pertinent labs & imaging results that were available during my care of the patient were reviewed by me and considered in my medical decision making (see chart for details).  Alva Eliane Decree was evaluated in Emergency  Department on 01/02/2021 for the symptoms described in the history of present illness. He was evaluated in the context of the global COVID-19 pandemic, which necessitated consideration that the patient might be at risk for infection with the SARS-CoV-2 virus that causes COVID-19. Institutional protocols and algorithms that pertain to the evaluation of patients at risk for COVID-19 are in a state of rapid change based on information released by regulatory bodies including the CDC and federal and state organizations. These policies and algorithms were followed during the patient's care in the ED.   Patient presents with severe right flank pain after recent cystoscopy and stent removal.  We will give a Dilaudid IV 1 mg for pain relief.  Obtain CT abdomen pelvis and labs.   ----------------------------------------- 1:16 PM on 01/02/2021 -----------------------------------------  CT shows T12 compression fracture.  No neurologic deficits but there is severe pain even after IV Dilaudid.  Case discussed with the hospitalist for further management.  Neurosurgery Dr. Lacinda Axon recommends TLSO brace.     ____________________________________________   FINAL CLINICAL IMPRESSION(S) / ED DIAGNOSES    Final diagnoses:  Compression fracture of T12 vertebra, initial encounter [facility])     ED Discharge Orders    None      Portions of this note were generated with dragon dictation software. Dictation errors may occur despite best attempts at proofreading.   Carrie Mew, MD 01/02/21 [phone]

## 2021-01-02 NOTE — Progress Notes (Signed)
OT Cancellation Note  Patient Details Name: Jack [name] MRN: [id number] DOB: 25-Oct-1932   Cancelled Treatment:    Reason Eval/Treat Not Completed: Other (comment). Consult received, chart reviewed. Upon attempt this afternoon, TLSO had not arrived yet. Will re-attempt OT evaluation at later date/time once TLSO has arrived and been fitted to pt.   Hanley Hays, MPH, MS, OTR/L ascom [phone] 01/02/21, 3:25 PM

## 2021-01-02 NOTE — Progress Notes (Signed)
PT Cancellation Note  Patient Details Name: Jack [name] MRN: [id number] DOB: 11/23/1932   Cancelled Treatment:     TLSO not applied at this time. Educated pt on role and expectations for PT. Will continue to follow for initiation of eval.   Sabryna Lahm A Malachi Suderman 01/02/2021, 2:14 PM

## 2021-01-02 NOTE — Progress Notes (Signed)
Orthopedic Tech Progress Note Patient Details:  RAYFORD WILLIAMSEN Oct 10, 1932 [id number] Ordered brace Patient ID: [name], male   DOB: 08-19-1933, 87 y.o.   MRN: [id number]   Morgon Pamer 01/02/2021, 2:21 PM

## 2021-01-03 DIAGNOSIS — D631 Anemia in chronic kidney disease: Secondary | ICD-10-CM | POA: Diagnosis present

## 2021-01-03 DIAGNOSIS — Z8249 Family history of ischemic heart disease and other diseases of the circulatory system: Secondary | ICD-10-CM | POA: Diagnosis not present

## 2021-01-03 DIAGNOSIS — I2510 Atherosclerotic heart disease of native coronary artery without angina pectoris: Secondary | ICD-10-CM | POA: Diagnosis not present

## 2021-01-03 DIAGNOSIS — N179 Acute kidney failure, unspecified: Secondary | ICD-10-CM | POA: Diagnosis present

## 2021-01-03 DIAGNOSIS — K449 Diaphragmatic hernia without obstruction or gangrene: Secondary | ICD-10-CM | POA: Diagnosis present

## 2021-01-03 DIAGNOSIS — E785 Hyperlipidemia, unspecified: Secondary | ICD-10-CM | POA: Diagnosis present

## 2021-01-03 DIAGNOSIS — S22080D Wedge compression fracture of T11-T12 vertebra, subsequent encounter for fracture with routine healing: Secondary | ICD-10-CM | POA: Diagnosis not present

## 2021-01-03 DIAGNOSIS — Z881 Allergy status to other antibiotic agents status: Secondary | ICD-10-CM | POA: Diagnosis not present

## 2021-01-03 DIAGNOSIS — Z79899 Other long term (current) drug therapy: Secondary | ICD-10-CM | POA: Diagnosis not present

## 2021-01-03 DIAGNOSIS — G6289 Other specified polyneuropathies: Secondary | ICD-10-CM | POA: Diagnosis present

## 2021-01-03 DIAGNOSIS — S22080A Wedge compression fracture of T11-T12 vertebra, initial encounter for closed fracture: Secondary | ICD-10-CM | POA: Diagnosis present

## 2021-01-03 DIAGNOSIS — I491 Atrial premature depolarization: Secondary | ICD-10-CM | POA: Diagnosis not present

## 2021-01-03 DIAGNOSIS — Z8719 Personal history of other diseases of the digestive system: Secondary | ICD-10-CM | POA: Diagnosis not present

## 2021-01-03 DIAGNOSIS — R972 Elevated prostate specific antigen [PSA]: Secondary | ICD-10-CM | POA: Diagnosis present

## 2021-01-03 DIAGNOSIS — E876 Hypokalemia: Secondary | ICD-10-CM | POA: Diagnosis present

## 2021-01-03 DIAGNOSIS — M62838 Other muscle spasm: Secondary | ICD-10-CM | POA: Diagnosis present

## 2021-01-03 DIAGNOSIS — Z20822 Contact with and (suspected) exposure to covid-19: Secondary | ICD-10-CM | POA: Diagnosis present

## 2021-01-03 DIAGNOSIS — Z87891 Personal history of nicotine dependence: Secondary | ICD-10-CM | POA: Diagnosis not present

## 2021-01-03 DIAGNOSIS — Z87442 Personal history of urinary calculi: Secondary | ICD-10-CM | POA: Diagnosis not present

## 2021-01-03 DIAGNOSIS — E860 Dehydration: Secondary | ICD-10-CM | POA: Diagnosis present

## 2021-01-03 DIAGNOSIS — I10 Essential (primary) hypertension: Secondary | ICD-10-CM | POA: Diagnosis not present

## 2021-01-03 DIAGNOSIS — N1832 Chronic kidney disease, stage 3b: Secondary | ICD-10-CM | POA: Diagnosis present

## 2021-01-03 DIAGNOSIS — N400 Enlarged prostate without lower urinary tract symptoms: Secondary | ICD-10-CM | POA: Diagnosis present

## 2021-01-03 DIAGNOSIS — K219 Gastro-esophageal reflux disease without esophagitis: Secondary | ICD-10-CM | POA: Diagnosis present

## 2021-01-03 DIAGNOSIS — N1831 Chronic kidney disease, stage 3a: Secondary | ICD-10-CM | POA: Diagnosis not present

## 2021-01-03 DIAGNOSIS — I471 Supraventricular tachycardia: Secondary | ICD-10-CM | POA: Diagnosis not present

## 2021-01-03 DIAGNOSIS — Z888 Allergy status to other drugs, medicaments and biological substances status: Secondary | ICD-10-CM | POA: Diagnosis not present

## 2021-01-03 DIAGNOSIS — I129 Hypertensive chronic kidney disease with stage 1 through stage 4 chronic kidney disease, or unspecified chronic kidney disease: Secondary | ICD-10-CM | POA: Diagnosis present

## 2021-01-03 DIAGNOSIS — R002 Palpitations: Secondary | ICD-10-CM | POA: Diagnosis not present

## 2021-01-03 DIAGNOSIS — I700 Atherosclerosis of aorta: Secondary | ICD-10-CM | POA: Diagnosis present

## 2021-01-03 DIAGNOSIS — I499 Cardiac arrhythmia, unspecified: Secondary | ICD-10-CM | POA: Diagnosis not present

## 2021-01-03 DIAGNOSIS — Z83438 Family history of other disorder of lipoprotein metabolism and other lipidemia: Secondary | ICD-10-CM | POA: Diagnosis not present

## 2021-01-03 DIAGNOSIS — M4854XA Collapsed vertebra, not elsewhere classified, thoracic region, initial encounter for fracture: Secondary | ICD-10-CM | POA: Diagnosis present

## 2021-01-03 LAB — CBC
HCT: 37 % — ABNORMAL LOW (ref 39.0–52.0)
Hemoglobin: 12.6 g/dL — ABNORMAL LOW (ref 13.0–17.0)
MCH: 30.4 pg (ref 26.0–34.0)
MCHC: 34.1 g/dL (ref 30.0–36.0)
MCV: 89.2 fL (ref 80.0–100.0)
Platelets: 206 10*3/uL (ref 150–400)
RBC: 4.15 MIL/uL — ABNORMAL LOW (ref 4.22–5.81)
RDW: 14.1 % (ref 11.5–15.5)
WBC: 4.3 10*3/uL (ref 4.0–10.5)
nRBC: 0 % (ref 0.0–0.2)

## 2021-01-03 LAB — BASIC METABOLIC PANEL
Anion gap: 13 (ref 5–15)
BUN: 29 mg/dL — ABNORMAL HIGH (ref 8–23)
CO2: 24 mmol/L (ref 22–32)
Calcium: 9.3 mg/dL (ref 8.9–10.3)
Chloride: 100 mmol/L (ref 98–111)
Creatinine, Ser: 2 mg/dL — ABNORMAL HIGH (ref 0.61–1.24)
GFR, Estimated: 32 mL/min — ABNORMAL LOW (ref >60)
Glucose, Bld: 117 mg/dL — ABNORMAL HIGH (ref 70–99)
Potassium: 3 mmol/L — ABNORMAL LOW (ref 3.5–5.1)
Sodium: 137 mmol/L (ref 135–145)

## 2021-01-03 LAB — URINE CULTURE: Culture: NO GROWTH

## 2021-01-03 MED ORDER — DOCUSATE SODIUM 100 MG PO CAPS
200.0000 mg | ORAL_CAPSULE | Freq: Two times a day (BID) | ORAL | Status: DC
  Administered 2021-01-03 – 2021-01-07 (×8): 200 mg via ORAL
  Filled 2021-01-03 (×9): qty 2

## 2021-01-03 MED ORDER — POTASSIUM CHLORIDE CRYS ER 20 MEQ PO TBCR
20.0000 meq | EXTENDED_RELEASE_TABLET | Freq: Once | ORAL | Status: AC
  Administered 2021-01-03: 20 meq via ORAL
  Filled 2021-01-03: qty 1

## 2021-01-03 NOTE — Plan of Care (Signed)
Patient had an uneventful shift. No changes in neurological and neurovascular assessments. No complaints of pain over shift. SBP >165 x1, PRN med given.Denies any needs at this time. All Safety measures maintained. Care continues.  Problem: Education: Goal: Knowledge of General Education information will improve Description: Including pain rating scale, medication(s)/side effects and non-pharmacologic comfort measures Outcome: Progressing   Problem: Health Behavior/Discharge Planning: Goal: Ability to manage health-related needs will improve Outcome: Progressing   Problem: Clinical Measurements: Goal: Ability to maintain clinical measurements within normal limits will improve Outcome: Progressing Goal: Will remain free from infection Outcome: Progressing Goal: Diagnostic test results will improve Outcome: Progressing   Problem: Activity: Goal: Risk for activity intolerance will decrease Outcome: Progressing   Problem: Nutrition: Goal: Adequate nutrition will be maintained Outcome: Progressing   Problem: Coping: Goal: Level of anxiety will decrease Outcome: Progressing   Problem: Elimination: Goal: Will not experience complications related to bowel motility Outcome: Progressing Goal: Will not experience complications related to urinary retention Outcome: Progressing

## 2021-01-03 NOTE — Consult Note (Signed)
Neurosurgery-New Consultation Evaluation 01/03/2021 Jack [name] [id number]  Identifying Statement: Jack [name] is a 87 y.o. male from [place] [phone] with back pain  Physician Requesting Consultation: Ivor Costa, MD  History of Present Illness: Jack [name] is here for evaluation of ongoing severe back pain that he feels has worsened.  He has been treated for kidney stones over the past few weeks but given the increase in the pain, a new CT scan was performed which did reveal a T12 compression fracture.  He states the pain is very severe like ice pick in his back.  He does not endorse any pain radiating down his legs.  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  He does have an indwelling Foley catheter but is able to have bowel movements.  Given the pain, he was placed in a TLSO brace.  We are consulted for treatment guidance.  Past Medical History:  Past Medical History:  Diagnosis Date  . Elevated prostate specific antigen (PSA)    has been 7 for a year   . GERD (gastroesophageal reflux disease)   . History of colon polyps 2008   [facility],   . History of kidney stones   . Hyperlipidemia   . Hypertension   . Prostate hypertrophy    diagnosed at age 55 due to hematospermia  . Renal disorder     Social History: Social History   Socioeconomic History  . Marital status: Married    Spouse name: Jack [name]  . Number of children: 4  . Years of education: 67  . Highest education level: Not on file  Occupational History  . 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 Smoking status: Former Smoker    Quit date: 07/05/1965    Years since quitting: 55.5  . Smokeless tobacco: Never Used  Vaping Use  . Vaping Use: Never used  Substance and Sexual Activity  . Alcohol use: Yes    Alcohol/week: 1.0 standard drink    Types: 1 Standard drinks or equivalent per week    Comment: occassionaly  . Drug use: No   . Sexual activity: Yes    Birth control/protection: None  Other Topics Concern  . Not on file  Social History Narrative  . Not on file   Social Determinants of Health   Financial Resource Strain: Not on file  Food Insecurity: Not on file  Transportation Needs: Not on file  Physical Activity: Not on file  Stress: Not on file  Social Connections: Not on file  Intimate Partner Violence: Not on file    Family History: Family History  Problem Relation Age of Onset  . Hypertension Father   . Hyperlipidemia Father   . Cancer Sister        thyroid - dx in late 20's    Review of Systems:  Review of Systems - General ROS: Negative Psychological ROS: Negative Ophthalmic ROS: Negative ENT ROS: Negative Hematological and Lymphatic ROS: Negative  Endocrine ROS: Negative Respiratory ROS: Negative Cardiovascular ROS: Negative Gastrointestinal ROS: Negative Genito-Urinary ROS: Negative Musculoskeletal ROS: Positive for back pain Neurological ROS: Negative for leg pain or new numbness Dermatological ROS: Negative  Physical Exam: BP (!) 151/86 (BP Location: Left Arm)   Pulse 98   Temp 97.6 F (36.4 C)   Resp 16   Ht 5\' 9"  (1.753 m)   Wt 88.5 kg   SpO2 95%   BMI  28.80 kg/m  Body mass index is 28.8 kg/m. Body surface area is 2.08 meters squared. General appearance: Alert, cooperative, in no acute distress Head: Normocephalic, atraumatic Eyes: Normal, EOM intact Oropharynx: Moist without lesions Back: In TLSO brace Ext: No edema in LE bilaterally  Neurologic exam:  Mental status: alertness: alert, affect: normal Speech: fluent and clear Motor:strength symmetric 5/5 in bilateral lower extremities Sensory: intact to light touch in bilateral lower extremities Gait: Not tested  Imaging: CT scan abdomen pelvis:1. Small lower pole right renal calculus but no obstructing ureteral calculi or bladder calculi. 2. No worrisome renal or bladder lesions without contrast. 3. No  acute abdominal/pelvic findings, mass lesions or adenopathy. 4. Stable advanced atherosclerotic calcifications involving the aorta and branch vessels. 5. New T12 compression fracture with mild retropulsion but no significant canal compromise. This may be responsible for the patient's back pain.  Impression/Plan:  [name] is here for ongoing back pain and found to have a T12 compression fracture.  He does not have any neurologic symptoms and given this no surgery is recommended.  I do recommend continuing the TLSO brace as this will aid in healing.  He needs to have this on anytime he is out of bed.  He is cleared to work with physical therapy and he will need continued pain management with oral medications.  We did discuss that this pain should get better with time as this heals but typical healing can take up to 8 weeks.  If patient is still having significant pain, can consult the orthopedics team, Dr. Rudene Christians, to discuss possible kyphoplasty.  Otherwise, I can see him in clinic in follow-up   1.  Diagnosis  -T12 compression fracture  2.  Plan -Recommend TLSO brace at all times out of bed, cleared to work with physical therapy -Can consult orthopedics with Dr. Rudene Christians if want to consider kyphoplasty -Follow-up as an outpatient in [place] clinic at 3 weeks after discharge

## 2021-01-03 NOTE — Progress Notes (Signed)
PROGRESS NOTE    Jack [name]  KWI:[id number] DOB: [birth year] DOA: 01/02/2021 PCP: [name], MD   Assessment & Plan:   Principal Problem:   T12 compression fracture [facility]) Active Problems:   Essential hypertension   Prostate hypertrophy   Acute renal failure superimposed on stage 3a chronic kidney disease ([place])   Ureteral stone   T12 compression fracture: etiology unclear. Morphine, percocet prn. Continue w/ TLSO brace. PT/OT consulted. Neurosurg consulted.   HTN: continue on hydralazine.   BPH: continue on home dose of flomax  History of ureteral stone: s/p of ESWL & cystoscopy/urethral [place]/holmium laser/stent placement. Stent removed a weak ago. CT showed small lower pole right renal calculus but no obstructing ureteral calculi or bladder calculi.  AKI on CKDIIIa: likely secondary to dehydration & losartan use. Continue on IVFs  Hypokalemia: KCl repleted. Will continue to monitor   DVT prophylaxis: heparin  Code Status: full  Family Communication: discussed pt's care w/ pt's family at bedside and answered their questions Disposition Plan: likely d/c back home   Level of care: Med-Surg   Status is: Observation  The patient remains OBS appropriate and will d/c before 2 midnights.  Dispo: The patient is from: Home              Anticipated d/c is to: Home              Patient currently is not medically stable to d/c.   Difficult to place patient : unclear     Consultants:   neurosurg   Procedures:   Antimicrobials:    Subjective: Pt c/o back pain   Objective: Vitals:   01/02/21 2041 01/03/21 0001 01/03/21 0046 01/03/21 0353  BP: 131/75 (!) 166/106 (!) 148/89 (!) 155/95  Pulse: 75 75 81 88  Resp: 18 14  18   Temp: 98 F (36.7 C) 98.1 F (36.7 C)  97.9 F (36.6 C)  TempSrc:      SpO2: 96% 97% 97% 99%  Weight:      Height:        Intake/Output Summary (Last 24 hours) at 01/03/2021 0724 Last data filed at 01/03/2021 0416 Gross  per 24 hour  Intake 1486.44 ml  Output 1525 ml  Net -38.56 ml   Filed Weights   01/02/21 0853  Weight: 88.5 kg    Examination:  General exam: Appears uncomfortable  Respiratory system: Clear to auscultation. Respiratory effort normal. Cardiovascular system: S1 & S2 +. No rubs, gallops or clicks.  Gastrointestinal system: Abdomen is nondistended, soft and nontender, hypoactive bowel sounds heard. Central nervous system: Alert and oriented. Moves all 4 extremities  Psychiatry: Judgement and insight appear normal. Flat mood and affect    Data Reviewed: I have personally reviewed following labs and imaging studies  CBC: Recent Labs  Lab 01/02/21 0857 01/03/21 0419  WBC 3.7* 4.3  NEUTROABS 2.0  --   HGB 12.8* 12.6*  HCT 37.9* 37.0*  MCV 89.6 89.2  PLT 206 242   Basic Metabolic Panel: Recent Labs  Lab 01/02/21 0857 01/03/21 0419  NA 135 137  K 3.5 3.0*  CL 98 100  CO2 24 24  GLUCOSE 116* 117*  BUN 34* 29*  CREATININE 2.17* 2.00*  CALCIUM 9.9 9.3   GFR: Estimated Creatinine Clearance: 29.7 mL/min (A) (by C-G formula based on SCr of 2 mg/dL (H)). Liver Function Tests: Recent Labs  Lab 01/02/21 0857  AST 28  ALT 11  ALKPHOS 106  BILITOT 1.0  PROT 6.6  ALBUMIN 4.2   No results for input(s): LIPASE, AMYLASE in the last 168 hours. No results for input(s): AMMONIA in the last 168 hours. Coagulation Profile: Recent Labs  Lab 01/02/21 1410  INR 1.1   Cardiac Enzymes: No results for input(s): CKTOTAL, CKMB, CKMBINDEX, TROPONINI in the last 168 hours. BNP (last 3 results) No results for input(s): PROBNP in the last 8760 hours. HbA1C: No results for input(s): HGBA1C in the last 72 hours. CBG: No results for input(s): GLUCAP in the last 168 hours. Lipid Profile: No results for input(s): CHOL, HDL, LDLCALC, TRIG, CHOLHDL, LDLDIRECT in the last 72 hours. Thyroid Function Tests: No results for input(s): TSH, T4TOTAL, FREET4, T3FREE, THYROIDAB in the last 72  hours. Anemia Panel: No results for input(s): VITAMINB12, FOLATE, FERRITIN, TIBC, IRON, RETICCTPCT in the last 72 hours. Sepsis Labs: No results for input(s): PROCALCITON, LATICACIDVEN in the last 168 hours.  Recent Results (from the past 240 hour(s))  Microscopic Examination     Status: Abnormal   Collection Time: 12/25/20 10:37 AM   Urine  Result Value Ref Range Status   WBC, UA 6-10 (A) 0 - 5 /hpf Final   RBC 11-30 (A) 0 - 2 /hpf Final   Epithelial Cells (non renal) 0-10 0 - 10 /hpf Final   Casts Present (A) None seen /lpf Final   Cast Type Granular casts (A) N/A Final    Comment: Cellular casts   Bacteria, UA Few None seen/Few Final  Resp Panel by RT-PCR (Flu A&B, Covid) Nasopharyngeal Swab     Status: None   Collection Time: 01/02/21 11:40 AM   Specimen: Nasopharyngeal Swab; Nasopharyngeal(NP) swabs in vial transport medium  Result Value Ref Range Status   SARS Coronavirus 2 by RT PCR NEGATIVE NEGATIVE Final    Comment: (NOTE) SARS-CoV-2 target nucleic acids are NOT DETECTED.  The SARS-CoV-2 RNA is generally detectable in upper respiratory specimens during the acute phase of infection. The lowest concentration of SARS-CoV-2 viral copies this assay can detect is 138 copies/mL. A negative result does not preclude SARS-Cov-2 infection and should not be used as the sole basis for treatment or other patient management decisions. A negative result may occur with  improper specimen collection/handling, submission of specimen other than nasopharyngeal swab, presence of viral mutation(s) within the areas targeted by this assay, and inadequate number of viral copies(<138 copies/mL). A negative result must be combined with clinical observations, patient history, and epidemiological information. The expected result is Negative.  Fact Sheet for Patients:  EntrepreneurPulse.com.au  Fact Sheet for Healthcare Providers:   IncredibleEmployment.be  This test is no t yet approved or cleared by the [place] FDA and  has been authorized for detection and/or diagnosis of SARS-CoV-2 by FDA under an Emergency Use Authorization (EUA). This EUA will remain  in effect (meaning this test can be used) for the duration of the COVID-19 declaration under Section 564(b)(1) of the Act, 21 U.S.C.section 360bbb-3(b)(1), unless the authorization is terminated  or revoked sooner.       Influenza A by PCR NEGATIVE NEGATIVE Final   Influenza B by PCR NEGATIVE NEGATIVE Final    Comment: (NOTE) The Xpert Xpress SARS-CoV-2/FLU/RSV plus assay is intended as an aid in the diagnosis of influenza from Nasopharyngeal swab specimens and should not be used as a sole basis for treatment. Nasal washings and aspirates are unacceptable for Xpert Xpress SARS-CoV-2/FLU/RSV testing.  Fact Sheet for Patients: EntrepreneurPulse.com.au  Fact Sheet for Healthcare Providers: IncredibleEmployment.be  This test is not yet  approved or cleared by the [place] and has been authorized for detection and/or diagnosis of SARS-CoV-2 by FDA under an Emergency Use Authorization (EUA). This EUA will remain in effect (meaning this test can be used) for the duration of the COVID-19 declaration under Section 564(b)(1) of the Act, 21 U.S.C. section 360bbb-3(b)(1), unless the authorization is terminated or revoked.  Performed at [facility], [address] [place], [place] [address]          Radiology Studies: CT Renal Stone Study  Result Date: 01/02/2021 CLINICAL DATA:  Right flank pain for 7-10 days.  Recent lithotripsy. EXAM: CT ABDOMEN AND PELVIS WITHOUT CONTRAST TECHNIQUE: Multidetector CT imaging of the abdomen and pelvis was performed following the standard protocol without IV contrast. COMPARISON:  CT scan 12/08/2020 FINDINGS: Lower chest: The lung bases are  clear of acute process. No pleural effusion or pulmonary lesions. The heart is normal in size. No pericardial effusion. Stable aortic and coronary artery calcifications. Stable moderate-sized hiatal hernia. Hepatobiliary: No hepatic lesions or intrahepatic biliary dilatation. The gallbladder is unremarkable. No common bile duct dilatation. Pancreas: No mass, inflammation or ductal dilatation. Spleen: Normal size.  No focal lesions. Adrenals/Urinary Tract: Adrenal glands are unremarkable and stable. Small lower pole right renal calculus but no obstructing ureteral calculi or bladder calculi. The large UPJ calculus seen on the prior CT scan has been removed. No left-sided renal or ureteral calculi. No worrisome renal or bladder lesions without contrast. Stomach/Bowel: The stomach, duodenum, small bowel and colon are grossly normal without oral contrast. No inflammatory changes, mass lesions or obstructive findings. The appendix is normal. Vascular/Lymphatic: Stable tortuosity and advanced atherosclerotic calcifications involving the aorta common iliac arteries and branch vessels. No mesenteric or retroperitoneal mass or adenopathy. Reproductive: Stable prostate gland enlargement with median lobe hypertrophy impressing on the base of the bladder. The seminal vesicles are unremarkable. Other: No pelvic mass or adenopathy. No free pelvic fluid collections. No inguinal mass or adenopathy. No abdominal wall hernia or subcutaneous lesions. Surgical changes related to a prior left-sided inguinal hernia repair. No recurrent hernia. Musculoskeletal: Age related osteoporosis. There is a new T12 compression fracture with mild retropulsion involving the posterosuperior aspect of the vertebral body but no significant canal compromise. This may be responsible for the patient's back pain. IMPRESSION: 1. Small lower pole right renal calculus but no obstructing ureteral calculi or bladder calculi. 2. No worrisome renal or bladder  lesions without contrast. 3. No acute abdominal/pelvic findings, mass lesions or adenopathy. 4. Stable advanced atherosclerotic calcifications involving the aorta and branch vessels. 5. New T12 compression fracture with mild retropulsion but no significant canal compromise. This may be responsible for the patient's back pain. 6. Stable moderate-sized hiatal hernia. 7. Stable prostate gland enlargement with median lobe hypertrophy impressing on the base of the bladder. 8. Aortic atherosclerosis. Aortic Atherosclerosis (ICD10-I70.0). Electronically Signed   By: Marijo Sanes M.D.   On: 01/02/2021 10:13        Scheduled Meds: . vitamin C  1,000 mg Oral Daily  . cholecalciferol  1,000 Units Oral Daily  . heparin  5,000 Units Subcutaneous Q8H  . hydrALAZINE  25 mg Oral Q8H  . magnesium oxide  400 mg Oral Daily  . multivitamin-lutein  1 capsule Oral BID  . senna  1 tablet Oral Daily  . tamsulosin  0.4 mg Oral Daily  . vitamin B-12  100 mcg Oral Daily  . zinc sulfate  220 mg Oral Daily   Continuous Infusions: .  sodium chloride 75 mL/hr at 01/03/21 0416     LOS: 0 days    Time spent: 33 mins     Wyvonnia Dusky, MD [facility] Pager [phone]  If 7PM-7AM, please contact night-coverage 01/03/2021, 7:24 AM

## 2021-01-03 NOTE — Evaluation (Signed)
Occupational Therapy Evaluation Patient Details Name: Jack [name] MRN: [id number] DOB: [birth year] Today's Date: 01/03/2021    History of Present Illness Pt. is an 87 yo male who was admitted to [facility] with onset of LBP was found to have kidney stone, lithotripsy done with stone still present, ARF, Has newer sharp pain and found T12 compression fracture.  PMHx:  atherosclerosis, CKD3a, HTN, HLD, prostate hypertrophy   Clinical Impression   Pt. presents with 10/10 back pain with movement, weakness, limited activity tolerance, and limited functional mobility which hinders his ability to complete basic ADL and IADL functioning. Pt. Resides at home with his wife. Pt. Reports being independent with ADLs, and IADL functioning. Pt. Was able to drive. Pt. Is limited by pain reporting 3-7/10 pain at rest. 10/10 pain with movement. Pt. Nurse present and medicated pt with morphine. Pt. Education was provided about A/E for LE ADLs. Pt. Could benefit from additional review of the equipment for carry-over with LE ADLs. Pt. was assisted in repositioning for comfort, and provided with additional blankets. Pt requested Tums for heart burn. Pt. Nurse was notified of pt.'s request. Pt. Will  benefit from OT services for ADL training, additional A/E training, and pt. Education about home modification, and DME. Pt. would benefit from SNF level of care upon discharge, with follow-up OT services.    Follow Up Recommendations  SNF    Equipment Recommendations    BSCommode   Recommendations for Other Services       Precautions / Restrictions Precautions Precautions: Back;Fall Precaution Booklet Issued: No Precaution Comments: reviewed verbally Required Braces or Orthoses: Spinal Brace Spinal Brace: Thoracolumbosacral orthotic;Applied in sitting position Restrictions Weight Bearing Restrictions: No      Mobility Bed Mobility Overal bed mobility: Needs Assistance Bed Mobility: Rolling;Sidelying to  Sit Rolling: Min assist Sidelying to sit: Mod assist       General bed mobility comments: Increased assist to reposition to the [facility].    Transfers          General transfer comment: Deferred    Balance                              ADL either performed or assessed with clinical judgement   ADL Overall ADL's : Needs assistance/impaired Eating/Feeding: Set up;Bed level   Grooming: Set up;Bed level   Upper Body Bathing: Maximal assistance   Lower Body Bathing: Maximal assistance   Upper Body Dressing : Maximal assistance   Lower Body Dressing: Maximal assistance                       Vision Patient Visual Report: No change from baseline       Perception     Praxis      Pertinent Vitals/Pain Pain Assessment: 0-10 Pain Score: 10-Worst pain ever Pain Location: back Pain Descriptors / Indicators: Tightness Pain Intervention(s): Limited activity within patient's tolerance;Monitored during session     Hand Dominance Right   Extremity/Trunk Assessment Upper Extremity Assessment Upper Extremity Assessment: Overall WFL for tasks assessed      Cervical / Trunk Assessment Cervical / Trunk Assessment: Other exceptions (has T12 compression fracture)   Communication Communication Communication: No difficulties   Cognition Arousal/Alertness: Awake/alert Behavior During Therapy: Restless Overall Cognitive Status: Within Functional Limits for tasks assessed  General Comments: 10/10 pain with movement   General Comments  pt was assisted to don the brace in sitting and then to step to [facility], then walked to chair    Exercises Exercises: Other exercises (LE strength is 4+ to5)   Shoulder Instructions      Home Living Family/patient expects to be discharged to:: Private residence Living Arrangements: Spouse/significant other Available Help at Discharge: Family;Available 24 hours/day Type of Home:  House Home Access: Stairs to enter [employer] of Steps: 1   Home Layout: One level         Biochemist, clinical: [place] - single point;Shower seat;Grab bars - tub/shower;Hand held shower head   Additional Comments: has been walking on [facility]      Prior Functioning/Environment Level of Independence: Independent with assistive device(s)        Comments: Reports independence with ADLs, and IADLs        OT Problem List: Decreased strength;Decreased knowledge of use of DME or AE;Decreased activity tolerance;Pain      OT Treatment/Interventions:      OT Goals(Current goals can be found in the care plan section) Acute Rehab OT Goals Patient Stated Goal: To decrease pain OT Goal Formulation: With patient  OT Frequency:     Barriers to D/C:            Co-evaluation              AM-PAC OT "6 Clicks" Daily Activity     Outcome Measure Help from another person eating meals?: None Help from another person taking care of personal grooming?: None Help from another person toileting, which includes using toliet, bedpan, or urinal?: A Lot Help from another person bathing (including washing, rinsing, drying)?: A Lot Help from another person to put on and taking off regular upper body clothing?: A Lot Help from another person to put on and taking off regular lower body clothing?: A Lot 6 Click Score: 16   End of Session    Activity Tolerance: Patient tolerated treatment well Patient left: in bed;with call bell/phone within reach;with chair alarm set  OT Visit Diagnosis: Muscle weakness (generalized) (M62.81)                Time: 1440-1501 OT Time Calculation (min): 21 min Charges:  OT General Charges $OT Visit: 1 Visit OT Evaluation $OT Eval Low Complexity: 1 Low  Harrel Carina, MS, OTR/L  Harrel Carina 01/03/2021, 4:08 PM

## 2021-01-03 NOTE — Evaluation (Signed)
Physical Therapy Evaluation Patient Details Name: Jack [name] MRN: [id number] DOB: [birth year] Today's Date: 01/03/2021   History of Present Illness  87 yo male with onset of LBP was found to have kidney stone, lithotripsy done with stone still present.  In acute renal failure.  Has newer sharp pain and found T12 compression fracture.  PMHx:  atherosclerosis, CKD3a, HTN, HLD, prostate hypertrophy  Clinical Impression  Pt was seen for initial mobility assessment and was able to do more than he felt initially was possible.  Pain is his biggest limit, but has some relief with TLSO in place.  Pt was apparently wearing the brace in bed and reviewed wearing only with OOB mobility.  Walked a total of 7', up in chair with comfortable posture and supported with pillows to maintain precautions.  Reviewed all spinal precautions and was able to follow them to get OOB.  Follow along for strengthening and balance control, and pt is being considered for a kyphoplasty, will potentially affect discharge planning.      Follow Up Recommendations Home health PT;Supervision for mobility/OOB;Supervision/Assistance - 24 hour    Equipment Recommendations  Rolling walker with 5" wheels    Recommendations for Other Services       Precautions / Restrictions Precautions Precautions: Back;Fall Precaution Booklet Issued: No Precaution Comments: reviewed verbally Required Braces or Orthoses: Spinal Brace Spinal Brace: Thoracolumbosacral orthotic;Applied in sitting position Restrictions Weight Bearing Restrictions: No      Mobility  Bed Mobility Overal bed mobility: Needs Assistance Bed Mobility: Rolling;Sidelying to Sit Rolling: Min assist Sidelying to sit: Mod assist            Transfers Overall transfer level: Needs assistance Equipment used: Rolling walker (2 wheeled);1 person hand held assist Transfers: Sit to/from Stand Sit to Stand: Mod assist         General transfer comment: gradually  decreased over session from heavy mod to lighter mod  Ambulation/Gait Ambulation/Gait assistance: Min guard Gait Distance (Feet): 7 Feet Assistive device: Rolling walker (2 wheeled);1 person hand held assist Gait Pattern/deviations: Step-to pattern;Decreased stride length;Wide base of support Gait velocity: reduced Gait velocity interpretation: <1.31 ft/sec, indicative of household ambulator General Gait Details: sidestepped down side of bed and then sat in chair  Stairs            Wheelchair Mobility    Modified Rankin (Stroke Patients Only)       Balance Overall balance assessment: Needs assistance Sitting-balance support: Feet supported Sitting balance-Leahy Scale: Fair     Standing balance support: Bilateral upper extremity supported;During functional activity Standing balance-Leahy Scale: Poor                               Pertinent Vitals/Pain Pain Assessment: 0-10 Pain Score: 7  Pain Location: back Pain Descriptors / Indicators: Tender;Tightness Pain Intervention(s): Limited activity within patient's tolerance;Monitored during session;Premedicated before session;Repositioned    Home Living Family/patient expects to be discharged to:: Private residence Living Arrangements: Spouse/significant other Available Help at Discharge: Family;Available 24 hours/day Type of Home: House Home Access: Stairs to enter   [employer] of Steps: 1 Home Layout: One level Home Equipment: Cane - single point;Shower seat Additional Comments: has been walking on [facility]    Prior Function Level of Independence: Independent with assistive device(s)         Comments: unaccustomed to RW     Hand Dominance   Dominant Hand: Right    Extremity/Trunk  Assessment   Upper Extremity Assessment Upper Extremity Assessment: Defer to OT evaluation    Lower Extremity Assessment Lower Extremity Assessment: Overall WFL for tasks assessed    Cervical / Trunk  Assessment Cervical / Trunk Assessment: Other exceptions (has T12 compression fracture)  Communication   Communication: No difficulties  Cognition Arousal/Alertness: Awake/alert Behavior During Therapy: Anxious Overall Cognitive Status: Within Functional Limits for tasks assessed                                 General Comments: wife is present for information and to observe      General Comments General comments (skin integrity, edema, etc.): pt was assisted to don the brace in sitting and then to step to [facility], then walked to chair    Exercises     Assessment/Plan    PT Assessment Patient needs continued PT services  PT Problem List Decreased strength;Decreased activity tolerance;Decreased balance;Decreased knowledge of use of DME;Pain       PT Treatment Interventions DME instruction;Gait training;Stair training;Functional mobility training;Therapeutic activities;Therapeutic exercise;Balance training;Neuromuscular re-education;Patient/family education    PT Goals (Current goals can be found in the Care Plan section)  Acute Rehab PT Goals Patient Stated Goal: to hurt less PT Goal Formulation: With patient/family Time For Goal Achievement: 01/17/21 Potential to Achieve Goals: Good    Frequency 7X/week   Barriers to discharge   home with wife who will need to assist him    Co-evaluation               AM-PAC PT "6 Clicks" Mobility  Outcome Measure Help needed turning from your back to your side while in a flat bed without using bedrails?: A Little Help needed moving from lying on your back to sitting on the side of a flat bed without using bedrails?: A Lot Help needed moving to and from a bed to a chair (including a wheelchair)?: A Lot Help needed standing up from a chair using your arms (e.g., wheelchair or bedside chair)?: A Lot Help needed to walk in hospital room?: A Little Help needed climbing 3-5 steps with a railing? : Total 6 Click Score:  13    End of Session Equipment Utilized During Treatment: Gait belt Activity Tolerance: Patient tolerated treatment well;Patient limited by fatigue Patient left: in chair;with call bell/phone within reach;with chair alarm set;with family/visitor present Nurse Communication: Mobility status PT Visit Diagnosis: Unsteadiness on feet (R26.81);Difficulty in walking, not elsewhere classified (R26.2);Pain Pain - Right/Left:  (central) Pain - part of body:  (back)    Time: 7680-8811 PT Time Calculation (min) (ACUTE ONLY): 43 min   Charges:   PT Evaluation $PT Eval Moderate Complexity: 1 Mod PT Treatments $Gait Training: 8-22 mins $Therapeutic Activity: 8-22 mins       Ramond Dial 01/03/2021, 2:57 PM  Mee Hives, PT MS Acute Rehab Dept. Number: [place]

## 2021-01-03 NOTE — Progress Notes (Signed)
Physical Therapy Treatment Patient Details Name: Jack [name] MRN: [id number] DOB: 1932-10-11 Today's Date: 01/03/2021    History of Present Illness 87 yo male with onset of LBP was found to have kidney stone, lithotripsy done with stone still present.  In acute renal failure.  Has newer sharp pain and found T12 compression fracture.  PMHx:  atherosclerosis, CKD3a, HTN, HLD, prostate hypertrophy    PT Comments    Pt was seen for mobiltiy on RW, used TLSO to support his spine and re-assisted back to bed.  He is able to feel a support benefit of the brace once removed to get into bed, and instructed body mechanics to avoid straining the fracture.  Follow along for goals of acute PT and will need to try stairs at some point if feasible for home.    Follow Up Recommendations  Home health PT;Supervision for mobility/OOB;Supervision/Assistance - 24 hour     Equipment Recommendations  Rolling walker with 5" wheels    Recommendations for Other Services       Precautions / Restrictions Precautions Precautions: Back;Fall Precaution Booklet Issued: No Precaution Comments: reviewed verbally Required Braces or Orthoses: Spinal Brace Spinal Brace: Thoracolumbosacral orthotic;Applied in sitting position Restrictions Weight Bearing Restrictions: No    Mobility  Bed Mobility Overal bed mobility: Needs Assistance Bed Mobility: Rolling;Sit to Sidelying Rolling: Min assist       Sit to sidelying: Mod assist      Transfers Overall transfer level: Needs assistance Equipment used: Rolling walker (2 wheeled);1 person hand held assist Transfers: Sit to/from Stand Sit to Stand: Mod assist         General transfer comment: gradually decreased over session from heavy mod to lighter mod  Ambulation/Gait Ambulation/Gait assistance: Min guard Gait Distance (Feet): 7 Feet Assistive device: Rolling walker (2 wheeled);1 person hand held assist Gait Pattern/deviations: Step-to  pattern;Decreased stride length;Wide base of support Gait velocity: reduced   General Gait Details: sidestepped down side of bed and then sat in chair   Stairs             Wheelchair Mobility    Modified Rankin (Stroke Patients Only)       Balance Overall balance assessment: Needs assistance Sitting-balance support: Feet supported Sitting balance-Leahy Scale: Fair     Standing balance support: Bilateral upper extremity supported;During functional activity Standing balance-Leahy Scale: Poor                              Cognition Arousal/Alertness: Awake/alert Behavior During Therapy: Anxious Overall Cognitive Status: Within Functional Limits for tasks assessed                                 General Comments: wife is present for information and to observe      Exercises      General Comments        Pertinent Vitals/Pain Pain Assessment: 0-10 Pain Score: 5  Pain Location: back Pain Descriptors / Indicators: Tender;Tightness    Home Living                      Prior Function            PT Goals (current goals can now be found in the care plan section) Acute Rehab PT Goals Patient Stated Goal: to hurt less PT Goal Formulation: With patient/family Time For Goal Achievement:  01/17/21 Potential to Achieve Goals: Good    Frequency    7X/week      PT Plan      Co-evaluation              AM-PAC PT "6 Clicks" Mobility   Outcome Measure  Help needed turning from your back to your side while in a flat bed without using bedrails?: A Little Help needed moving from lying on your back to sitting on the side of a flat bed without using bedrails?: A Lot Help needed moving to and from a bed to a chair (including a wheelchair)?: A Lot Help needed standing up from a chair using your arms (e.g., wheelchair or bedside chair)?: A Lot Help needed to walk in hospital room?: A Little Help needed climbing 3-5 steps with  a railing? : Total 6 Click Score: 13    End of Session Equipment Utilized During Treatment: Gait belt Activity Tolerance: Patient tolerated treatment well;Patient limited by fatigue Patient left: in chair;with call bell/phone within reach;with chair alarm set;with family/visitor present Nurse Communication: Mobility status PT Visit Diagnosis: Unsteadiness on feet (R26.81);Difficulty in walking, not elsewhere classified (R26.2);Pain Pain - Right/Left:  (central) Pain - part of body:  (back)     Time: 8264-1583 PT Time Calculation (min) (ACUTE ONLY): 22 min  Charges:  $Therapeutic Activity: 8-22 mins                Ramond Dial 01/03/2021, 8:40 PM Mee Hives, PT MS Acute Rehab Dept. Number: [place] [facility] [place]

## 2021-01-04 LAB — CBC
HCT: 32 % — ABNORMAL LOW (ref 39.0–52.0)
Hemoglobin: 10.9 g/dL — ABNORMAL LOW (ref 13.0–17.0)
MCH: 30.5 pg (ref 26.0–34.0)
MCHC: 34.1 g/dL (ref 30.0–36.0)
MCV: 89.6 fL (ref 80.0–100.0)
Platelets: 186 10*3/uL (ref 150–400)
RBC: 3.57 MIL/uL — ABNORMAL LOW (ref 4.22–5.81)
RDW: 14.1 % (ref 11.5–15.5)
WBC: 4.1 10*3/uL (ref 4.0–10.5)
nRBC: 0 % (ref 0.0–0.2)

## 2021-01-04 LAB — BASIC METABOLIC PANEL
Anion gap: 8 (ref 5–15)
BUN: 27 mg/dL — ABNORMAL HIGH (ref 8–23)
CO2: 23 mmol/L (ref 22–32)
Calcium: 9.3 mg/dL (ref 8.9–10.3)
Chloride: 106 mmol/L (ref 98–111)
Creatinine, Ser: 2 mg/dL — ABNORMAL HIGH (ref 0.61–1.24)
GFR, Estimated: 32 mL/min — ABNORMAL LOW (ref >60)
Glucose, Bld: 100 mg/dL — ABNORMAL HIGH (ref 70–99)
Potassium: 3.4 mmol/L — ABNORMAL LOW (ref 3.5–5.1)
Sodium: 137 mmol/L (ref 135–145)

## 2021-01-04 NOTE — Progress Notes (Signed)
Physical Therapy Treatment Patient Details Name: Jack [name] MRN: [id number] DOB: 07/29/1933 Today's Date: 01/04/2021    History of Present Illness 87 yo male with onset of LBP was found to have kidney stone, lithotripsy done with stone still present.  In acute renal failure.  Has newer sharp pain and found T12 compression fracture.  PMHx:  atherosclerosis, CKD3a, HTN, HLD, prostate hypertrophy    PT Comments     Pt ready for session.  To EOB with min a x 1.  Initially unsteady in sitting but gains it in time.  Full assist to don brace.  Stood with min a x 1 and took several steps to recliner with min a x 1.  Encouraged to stand but is limited by pain and chooses to sit fairly soon after completing transfer.  Participated in exercises as described below.   Pt did ask about kyphoplasty during session.  Relayed to primary MD his request to speak to ortho regarding possibility.    Pt continues with limited mobility due to pain.  He is generally strong but given high dependence on walker, decreased balance, inability to don brace and general limitations, it may be appropriate for SNF at discharge.  Will continue to monitor and will adjust tomorrow as appropriate if mobility does not improve.   Follow Up Recommendations  Home health PT;Supervision for mobility/OOB;Supervision/Assistance - 24 hour;Other (comment)     Equipment Recommendations  Rolling walker with 5" wheels    Recommendations for Other Services       Precautions / Restrictions Precautions Precautions: Back;Fall Precaution Booklet Issued: No Precaution Comments: reviewed verbally Required Braces or Orthoses: Spinal Brace Spinal Brace: Thoracolumbosacral orthotic;Applied in sitting position Restrictions Weight Bearing Restrictions: No    Mobility  Bed Mobility Overal bed mobility: Needs Assistance Bed Mobility: Rolling;Sidelying to Sit Rolling: Min assist Sidelying to sit: Min assist       General bed  mobility comments: unsteady initially but gains balance with time in sitting    Transfers Overall transfer level: Needs assistance Equipment used: Rolling walker (2 wheeled) Transfers: Sit to/from Stand Sit to Stand: Min assist            Ambulation/Gait Ambulation/Gait assistance: Min guard Gait Distance (Feet): 3 Feet Assistive device: Rolling walker (2 wheeled)   Gait velocity: reduced   General Gait Details: transfered to chair but gait limited by pain   Stairs             Wheelchair Mobility    Modified Rankin (Stroke Patients Only)       Balance Overall balance assessment: Needs assistance Sitting-balance support: Feet supported Sitting balance-Leahy Scale: Fair     Standing balance support: Bilateral upper extremity supported;During functional activity Standing balance-Leahy Scale: Poor                              Cognition Arousal/Alertness: Awake/alert Behavior During Therapy: WFL for tasks assessed/performed Overall Cognitive Status: Within Functional Limits for tasks assessed                                        Exercises Other Exercises Other Exercises: seated AROM x 10 BLE    General Comments        Pertinent Vitals/Pain Pain Assessment: Faces Faces Pain Scale: Hurts whole lot Pain Location: back Pain Descriptors / Indicators: Tender;Tightness Pain  Intervention(s): Limited activity within patient's tolerance;Monitored during session;Repositioned    Home Living                      Prior Function            PT Goals (current goals can now be found in the care plan section) Progress towards PT goals: Progressing toward goals    Frequency    7X/week      PT Plan Current plan remains appropriate    Co-evaluation              AM-PAC PT "6 Clicks" Mobility   Outcome Measure  Help needed turning from your back to your side while in a flat bed without using bedrails?: A  Little Help needed moving from lying on your back to sitting on the side of a flat bed without using bedrails?: A Lot Help needed moving to and from a bed to a chair (including a wheelchair)?: A Lot Help needed standing up from a chair using your arms (e.g., wheelchair or bedside chair)?: A Lot Help needed to walk in hospital room?: A Little Help needed climbing 3-5 steps with a railing? : Total 6 Click Score: 13    End of Session Equipment Utilized During Treatment: Gait belt Activity Tolerance: Patient tolerated treatment well;Patient limited by pain Patient left: in chair;with call bell/phone within reach;with chair alarm set;with family/visitor present Nurse Communication: Mobility status PT Visit Diagnosis: Unsteadiness on feet (R26.81);Difficulty in walking, not elsewhere classified (R26.2);Pain     Time: 6967-8938 PT Time Calculation (min) (ACUTE ONLY): 19 min  Charges:  $Therapeutic Exercise: 8-22 mins                    Chesley Noon, PTA 01/04/21, 9:11 AM

## 2021-01-04 NOTE — Progress Notes (Addendum)
PROGRESS NOTE    Jack [name]  YOV:[id number] DOB: 1933-02-24 DOA: 01/02/2021 PCP: [name], MD   Assessment & Plan:   Principal Problem:   T12 compression fracture [facility]) Active Problems:   Essential hypertension   Prostate hypertrophy   Acute renal failure superimposed on stage 3a chronic kidney disease ([place])   Ureteral stone   T12 compression fracture: still w/ pain today, slightly improved from day prior. Etiology unclear. Morphine, percocet prn. Continue w/ TLSO brace. PT recs HH and OT recs SNF. Neuro surg recs apprec   HTN: continue on hydralazine   BPH: continue on home dose of flomax  History of ureteral stone: s/p of ESWL & cystoscopy/urethral [place]/holmium laser/stent placement. Stent removed a weak ago. CT showed small lower pole right renal calculus but no obstructing ureteral calculi or bladder calculi.  AKI on CKDIIIa: unchanged from day prior. Avoid nephrotoxic meds. Continue on IVFs  Hypokalemia: will hold giving potassium today secondary to AKI on CKD  DVT prophylaxis: heparin  Code Status: full  Family Communication:  Disposition Plan: likely d/c back home   Level of care: Med-Surg   Status is: Observation  The patient remains OBS appropriate and will d/c before 2 midnights.  Dispo: The patient is from: Home              Anticipated d/c is to: Home              Patient currently is not medically stable to d/c.   Difficult to place patient : unclear     Consultants:   neurosurg   Procedures:   Antimicrobials:    Subjective: Pt c/o back pain, slightly improved from day prior.   Objective: Vitals:   01/03/21 0852 01/03/21 1700 01/03/21 2011 01/04/21 0448  BP: (!) 151/86 (!) 144/89 (!) 157/104 116/84  Pulse: 98 (!) 101 (!) 102 79  Resp: 16 16 18 18   Temp: 97.6 F (36.4 C) (!) 97.5 F (36.4 C) 98 F (36.7 C) (!) 97.1 F (36.2 C)  TempSrc:  Oral Oral Oral  SpO2: 95% 99% 97% 97%  Weight:      Height:         Intake/Output Summary (Last 24 hours) at 01/04/2021 0718 Last data filed at 01/04/2021 0716 Gross per 24 hour  Intake 2159.28 ml  Output 901 ml  Net 1258.28 ml   Filed Weights   01/02/21 0853  Weight: 88.5 kg    Examination:  General exam: Appears uncomfortable  Respiratory system: clear breath sounds b/l. No wheezes  Cardiovascular system: S1/S2+. No clicks or rubs   Gastrointestinal system: Abd is soft, NT, ND & hypoactive bowel sounds  Central nervous system: Alert and oriented. Moves all extremities  Psychiatry: Judgement and insight appear normal. Flat mood and affect     Data Reviewed: I have personally reviewed following labs and imaging studies  CBC: Recent Labs  Lab 01/02/21 0857 01/03/21 0419 01/04/21 0509  WBC 3.7* 4.3 4.1  NEUTROABS 2.0  --   --   HGB 12.8* 12.6* 10.9*  HCT 37.9* 37.0* 32.0*  MCV 89.6 89.2 89.6  PLT 206 206 741   Basic Metabolic Panel: Recent Labs  Lab 01/02/21 0857 01/03/21 0419 01/04/21 0509  NA 135 137 137  K 3.5 3.0* 3.4*  CL 98 100 106  CO2 24 24 23   GLUCOSE 116* 117* 100*  BUN 34* 29* 27*  CREATININE 2.17* 2.00* 2.00*  CALCIUM 9.9 9.3 9.3   GFR: Estimated Creatinine Clearance:  29.7 mL/min (A) (by C-G formula based on SCr of 2 mg/dL (H)). Liver Function Tests: Recent Labs  Lab 01/02/21 0857  AST 28  ALT 11  ALKPHOS 106  BILITOT 1.0  PROT 6.6  ALBUMIN 4.2   No results for input(s): LIPASE, AMYLASE in the last 168 hours. No results for input(s): AMMONIA in the last 168 hours. Coagulation Profile: Recent Labs  Lab 01/02/21 1410  INR 1.1   Cardiac Enzymes: No results for input(s): CKTOTAL, CKMB, CKMBINDEX, TROPONINI in the last 168 hours. BNP (last 3 results) No results for input(s): PROBNP in the last 8760 hours. HbA1C: No results for input(s): HGBA1C in the last 72 hours. CBG: No results for input(s): GLUCAP in the last 168 hours. Lipid Profile: No results for input(s): CHOL, HDL, LDLCALC, TRIG,  CHOLHDL, LDLDIRECT in the last 72 hours. Thyroid Function Tests: No results for input(s): TSH, T4TOTAL, FREET4, T3FREE, THYROIDAB in the last 72 hours. Anemia Panel: No results for input(s): VITAMINB12, FOLATE, FERRITIN, TIBC, IRON, RETICCTPCT in the last 72 hours. Sepsis Labs: No results for input(s): PROCALCITON, LATICACIDVEN in the last 168 hours.  Recent Results (from the past 240 hour(s))  Microscopic Examination     Status: Abnormal   Collection Time: 12/25/20 10:37 AM   Urine  Result Value Ref Range Status   WBC, UA 6-10 (A) 0 - 5 /hpf Final   RBC 11-30 (A) 0 - 2 /hpf Final   Epithelial Cells (non renal) 0-10 0 - 10 /hpf Final   Casts Present (A) None seen /lpf Final   Cast Type Granular casts (A) N/A Final    Comment: Cellular casts   Bacteria, UA Few None seen/Few Final  Urine culture     Status: None   Collection Time: 01/02/21 11:33 AM   Specimen: Urine, Random  Result Value Ref Range Status   Specimen Description   Final    URINE, RANDOM Performed at [facility], [address] [place], [place] [address]    Special Requests   Final    NONE Performed at [facility], [address], [place], [place] [address]    Culture   Final    NO GROWTH Performed at [facility], [place] [address] [place], [place] [address]    Report Status 01/03/2021 FINAL  Final  Resp Panel by RT-PCR (Flu A&B, Covid) Nasopharyngeal Swab     Status: None   Collection Time: 01/02/21 11:40 AM   Specimen: Nasopharyngeal Swab; Nasopharyngeal(NP) swabs in vial transport medium  Result Value Ref Range Status   SARS Coronavirus 2 by RT PCR NEGATIVE NEGATIVE Final    Comment: (NOTE) SARS-CoV-2 target nucleic acids are NOT DETECTED.  The SARS-CoV-2 RNA is generally detectable in upper respiratory specimens during the acute phase of infection. The lowest concentration of SARS-CoV-2 viral copies this assay can detect is 138 copies/mL. A negative result does not preclude  SARS-Cov-2 infection and should not be used as the sole basis for treatment or other patient management decisions. A negative result may occur with  improper specimen collection/handling, submission of specimen other than nasopharyngeal swab, presence of viral mutation(s) within the areas targeted by this assay, and inadequate number of viral copies(<138 copies/mL). A negative result must be combined with clinical observations, patient history, and epidemiological information. The expected result is Negative.  Fact Sheet for Patients:  EntrepreneurPulse.com.au  Fact Sheet for Healthcare Providers:  IncredibleEmployment.be  This test is no t yet approved or cleared by the [place]  States FDA and  has been authorized for detection and/or diagnosis of SARS-CoV-2 by FDA under an Emergency Use Authorization (EUA). This EUA will remain  in effect (meaning this test can be used) for the duration of the COVID-19 declaration under Section 564(b)(1) of the Act, 21 U.S.C.section 360bbb-3(b)(1), unless the authorization is terminated  or revoked sooner.       Influenza A by PCR NEGATIVE NEGATIVE Final   Influenza B by PCR NEGATIVE NEGATIVE Final    Comment: (NOTE) The Xpert Xpress SARS-CoV-2/FLU/RSV plus assay is intended as an aid in the diagnosis of influenza from Nasopharyngeal swab specimens and should not be used as a sole basis for treatment. Nasal washings and aspirates are unacceptable for Xpert Xpress SARS-CoV-2/FLU/RSV testing.  Fact Sheet for Patients: EntrepreneurPulse.com.au  Fact Sheet for Healthcare Providers: IncredibleEmployment.be  This test is not yet approved or cleared by the [place] FDA and has been authorized for detection and/or diagnosis of SARS-CoV-2 by FDA under an Emergency Use Authorization (EUA). This EUA will remain in effect (meaning this test can be used) for the duration of  the COVID-19 declaration under Section 564(b)(1) of the Act, 21 U.S.C. section 360bbb-3(b)(1), unless the authorization is terminated or revoked.  Performed at [facility], [address], [place], [place] [address]          Radiology Studies: CT Renal Stone Study  Result Date: 01/02/2021 CLINICAL DATA:  Right flank pain for 7-10 days.  Recent lithotripsy. EXAM: CT ABDOMEN AND PELVIS WITHOUT CONTRAST TECHNIQUE: Multidetector CT imaging of the abdomen and pelvis was performed following the standard protocol without IV contrast. COMPARISON:  CT scan 12/08/2020 FINDINGS: Lower chest: The lung bases are clear of acute process. No pleural effusion or pulmonary lesions. The heart is normal in size. No pericardial effusion. Stable aortic and coronary artery calcifications. Stable moderate-sized hiatal hernia. Hepatobiliary: No hepatic lesions or intrahepatic biliary dilatation. The gallbladder is unremarkable. No common bile duct dilatation. Pancreas: No mass, inflammation or ductal dilatation. Spleen: Normal size.  No focal lesions. Adrenals/Urinary Tract: Adrenal glands are unremarkable and stable. Small lower pole right renal calculus but no obstructing ureteral calculi or bladder calculi. The large UPJ calculus seen on the prior CT scan has been removed. No left-sided renal or ureteral calculi. No worrisome renal or bladder lesions without contrast. Stomach/Bowel: The stomach, duodenum, small bowel and colon are grossly normal without oral contrast. No inflammatory changes, mass lesions or obstructive findings. The appendix is normal. Vascular/Lymphatic: Stable tortuosity and advanced atherosclerotic calcifications involving the aorta common iliac arteries and branch vessels. No mesenteric or retroperitoneal mass or adenopathy. Reproductive: Stable prostate gland enlargement with median lobe hypertrophy impressing on the base of the bladder. The seminal vesicles are unremarkable. Other: No  pelvic mass or adenopathy. No free pelvic fluid collections. No inguinal mass or adenopathy. No abdominal wall hernia or subcutaneous lesions. Surgical changes related to a prior left-sided inguinal hernia repair. No recurrent hernia. Musculoskeletal: Age related osteoporosis. There is a new T12 compression fracture with mild retropulsion involving the posterosuperior aspect of the vertebral body but no significant canal compromise. This may be responsible for the patient's back pain. IMPRESSION: 1. Small lower pole right renal calculus but no obstructing ureteral calculi or bladder calculi. 2. No worrisome renal or bladder lesions without contrast. 3. No acute abdominal/pelvic findings, mass lesions or adenopathy. 4. Stable advanced atherosclerotic calcifications involving the aorta and branch vessels. 5. New T12 compression fracture with mild retropulsion but no significant canal  compromise. This may be responsible for the patient's back pain. 6. Stable moderate-sized hiatal hernia. 7. Stable prostate gland enlargement with median lobe hypertrophy impressing on the base of the bladder. 8. Aortic atherosclerosis. Aortic Atherosclerosis (ICD10-I70.0). Electronically Signed   By: Marijo Sanes M.D.   On: 01/02/2021 10:13        Scheduled Meds: . vitamin C  1,000 mg Oral Daily  . cholecalciferol  1,000 Units Oral Daily  . docusate sodium  200 mg Oral BID  . heparin  5,000 Units Subcutaneous Q8H  . hydrALAZINE  25 mg Oral Q8H  . magnesium oxide  400 mg Oral Daily  . multivitamin-lutein  1 capsule Oral BID  . senna  1 tablet Oral Daily  . tamsulosin  0.4 mg Oral Daily  . vitamin B-12  100 mcg Oral Daily  . zinc sulfate  220 mg Oral Daily   Continuous Infusions: . sodium chloride Stopped (01/04/21 0715)     LOS: 1 day    Time spent: 30 mins     Wyvonnia Dusky, MD [facility] Pager [phone]  If 7PM-7AM, please contact night-coverage 01/04/2021, 7:18 AM

## 2021-01-05 LAB — BASIC METABOLIC PANEL
Anion gap: 8 (ref 5–15)
BUN: 28 mg/dL — ABNORMAL HIGH (ref 8–23)
CO2: 25 mmol/L (ref 22–32)
Calcium: 9.3 mg/dL (ref 8.9–10.3)
Chloride: 105 mmol/L (ref 98–111)
Creatinine, Ser: 2.06 mg/dL — ABNORMAL HIGH (ref 0.61–1.24)
GFR, Estimated: 31 mL/min — ABNORMAL LOW (ref >60)
Glucose, Bld: 97 mg/dL (ref 70–99)
Potassium: 3.8 mmol/L (ref 3.5–5.1)
Sodium: 138 mmol/L (ref 135–145)

## 2021-01-05 LAB — CBC
HCT: 35 % — ABNORMAL LOW (ref 39.0–52.0)
Hemoglobin: 11.4 g/dL — ABNORMAL LOW (ref 13.0–17.0)
MCH: 30 pg (ref 26.0–34.0)
MCHC: 32.6 g/dL (ref 30.0–36.0)
MCV: 92.1 fL (ref 80.0–100.0)
Platelets: 175 10*3/uL (ref 150–400)
RBC: 3.8 MIL/uL — ABNORMAL LOW (ref 4.22–5.81)
RDW: 14.3 % (ref 11.5–15.5)
WBC: 3.7 10*3/uL — ABNORMAL LOW (ref 4.0–10.5)
nRBC: 0 % (ref 0.0–0.2)

## 2021-01-05 MED ORDER — METOPROLOL TARTRATE 25 MG PO TABS
25.0000 mg | ORAL_TABLET | Freq: Two times a day (BID) | ORAL | Status: DC
  Administered 2021-01-05 – 2021-01-06 (×3): 25 mg via ORAL
  Filled 2021-01-05 (×3): qty 1

## 2021-01-05 NOTE — NC FL2 (Signed)
[place] LEVEL OF CARE SCREENING TOOL     IDENTIFICATION  Patient Name: Jack [name] Birthdate: [birth year] Sex: male Admission Date (Current Location): 01/02/2021  [place] and [place] Number:  Engineering geologist and Address:  [facility], [address] [place], [place] [address]      Provider Number: [id number]  Attending Physician Name and Address:  Wyvonnia Dusky, MD  Relative Name and Phone Number:  [place], [place] (Spouse)   [phone] [facility])    Current Level of Care: Hospital Recommended Level of Care: [place] Prior Approval Number:    Date Approved/Denied:   PASRR Number: [id number]  Discharge Plan: SNF    Current Diagnoses: Patient Active Problem List   Diagnosis Date Noted  . T12 compression fracture ([place]) 01/02/2021  . History of kidney stones   . Acute renal failure superimposed on stage 3a chronic kidney disease ([place])   . Ureteral stone   . Ureteral perforation secondary to stent manipulation 10/16/2017  . Abdominal pain 10/16/2017  . Multinodular goiter 07/03/2014  . Thyroid nodule 06/20/2014  . Edema 09/24/2013  . Encounter for preventive health examination 08/21/2013  . Personal history of colonic polyps 08/21/2013  . History of venomous snake bite 08/21/2013  . Cough 10/02/2012  . Prostate hypertrophy   . Elevated prostate specific antigen (PSA)   . HYPERTRIGLYCERIDEMIA 12/21/2006  . Essential hypertension 12/21/2006  . GERD 12/21/2006  . CALCULUS, KIDNEY 12/21/2006    Orientation RESPIRATION BLADDER Height & Weight     Self,Time,Situation,Place  Normal Continent,External catheter Weight: 88.5 kg Height:  5\' 9"  (175.3 cm)  BEHAVIORAL SYMPTOMS/MOOD NEUROLOGICAL BOWEL NUTRITION STATUS      Continent Diet (Heart)  AMBULATORY STATUS COMMUNICATION OF NEEDS Skin   Limited Assist Verbally Normal ([place] incision closed)                       Personal Care  Assistance Level of Assistance  Bathing,Feeding,Dressing Bathing Assistance: Maximum assistance Feeding assistance: Limited assistance Dressing Assistance: Maximum assistance     Functional Limitations Info  Sight,Hearing,Speech Sight Info: Adequate Hearing Info: Adequate Speech Info: Adequate    SPECIAL CARE FACTORS FREQUENCY  PT (By licensed PT),OT (By licensed OT)     PT Frequency: 5x weekly OT Frequency: 5x weekly            Contractures Contractures Info: Not present    Additional Factors Info  Code Status,Allergies Code Status Info: FULL CODE Allergies Info: Amlodipine  Azithromycin  Lisinopril           Current Medications (01/05/2021):  This is the current hospital active medication list Current Facility-Administered Medications  Medication Dose Route Frequency Provider Last Rate Last Admin  . 0.9 %  sodium chloride infusion   Intravenous Continuous Ivor Costa, MD 75 mL/hr at 01/05/21 0327 Infusion Verify at 01/05/21 0327  . acetaminophen (TYLENOL) tablet 650 mg  650 mg Oral Q6H PRN Ivor Costa, MD      . ascorbic acid (VITAMIN C) tablet 1,000 mg  1,000 mg Oral Daily Ivor Costa, MD   1,000 mg at 01/05/21 0816  . cholecalciferol (VITAMIN D3) tablet 1,000 Units  1,000 Units Oral Daily Ivor Costa, MD   1,000 Units at 01/05/21 [phone]  . docusate sodium (COLACE) capsule 200 mg  200 mg Oral BID Wyvonnia Dusky, MD   200 mg at 01/05/21 0817  . heparin injection 5,000 Units  5,000 Units Subcutaneous Q8H Ivor Costa, MD  5,000 Units at 01/05/21 0500  . hydrALAZINE (APRESOLINE) injection 5 mg  5 mg Intravenous Q2H PRN Ivor Costa, MD   5 mg at 01/03/21 0008  . hydrALAZINE (APRESOLINE) tablet 25 mg  25 mg Oral Q8H Ivor Costa, MD   25 mg at 01/05/21 0500  . magnesium oxide (MAG-OX) tablet 400 mg  400 mg Oral Daily Ivor Costa, MD   400 mg at 01/05/21 0817  . methocarbamol (ROBAXIN) tablet 500 mg  500 mg Oral Q8H PRN Ivor Costa, MD   500 mg at 01/05/21 0817  . morphine 2 MG/ML  injection 1 mg  1 mg Intravenous Q3H PRN Ivor Costa, MD   1 mg at 01/05/21 0815  . multivitamin-lutein (OCUVITE-LUTEIN) capsule 1 capsule  1 capsule Oral BID Ivor Costa, MD   1 capsule at 01/05/21 0817  . ondansetron (ZOFRAN) injection 4 mg  4 mg Intravenous Q8H PRN Ivor Costa, MD   4 mg at 01/04/21 1737  . oxyCODONE-acetaminophen (PERCOCET/ROXICET) 5-325 MG per tablet 1 tablet  1 tablet Oral Q4H PRN Ivor Costa, MD   1 tablet at 01/05/21 1019  . polyethylene glycol (MIRALAX / GLYCOLAX) packet 17 g  17 g Oral Daily PRN Ivor Costa, MD   17 g at 01/05/21 0815  . senna (SENOKOT) tablet 8.6 mg  1 tablet Oral Daily Ivor Costa, MD   8.6 mg at 01/05/21 0817  . tamsulosin (FLOMAX) capsule 0.4 mg  0.4 mg Oral Daily Ivor Costa, MD   0.4 mg at 01/05/21 0817  . vitamin B-12 (CYANOCOBALAMIN) tablet 100 mcg  100 mcg Oral Daily Ivor Costa, MD   100 mcg at 01/05/21 0817  . zinc sulfate capsule 220 mg  220 mg Oral Daily Ivor Costa, MD   220 mg at 01/05/21 8828     Discharge Medications: Please see discharge summary for a list of discharge medications.  Relevant Imaging Results:  Relevant Lab Results:   Additional Information SSN [id number]  Pete Pelt, RN

## 2021-01-05 NOTE — Plan of Care (Signed)
Patient had an uneventful shift. No changes in neurological and neurovascular assessments. Pain controlled with PRN medications. Vital signs within normal range.Denies any needs at this time. All Safety measures maintained. Care continues.  Problem: Education: Goal: Knowledge of General Education information will improve Description: Including pain rating scale, medication(s)/side effects and non-pharmacologic comfort measures Outcome: Progressing   Problem: Health Behavior/Discharge Planning: Goal: Ability to manage health-related needs will improve Outcome: Progressing   Problem: Clinical Measurements: Goal: Ability to maintain clinical measurements within normal limits will improve Outcome: Progressing Goal: Will remain free from infection Outcome: Progressing Goal: Diagnostic test results will improve Outcome: Progressing   Problem: Activity: Goal: Risk for activity intolerance will decrease Outcome: Progressing   Problem: Nutrition: Goal: Adequate nutrition will be maintained Outcome: Progressing   Problem: Coping: Goal: Level of anxiety will decrease Outcome: Progressing   Problem: Elimination: Goal: Will not experience complications related to bowel motility Outcome: Progressing Goal: Will not experience complications related to urinary retention Outcome: Progressing

## 2021-01-05 NOTE — TOC Progression Note (Signed)
Transition of Care [facility]) - Progression Note    Patient Details  Name: ELIUD POLO MRN: [id number] Date of Birth: [birth year]  Transition of Care [facility]) CM/SW [place], RN Phone Number: 01/05/2021, 3:18 PM  Clinical Narrative:    Spoke with the patient and his wife at the bedside, explained the process of searching for a bed for STR, They stated that they want [facility] first choice then [facility] and third choice is [facility], I explained how the bedsearch process works, I explained that the facilities offer if they have availability and explained that I will send out to these facilities and request them to take a look and see if they can offer a bed.  I explained the insurance approval process as well, I reached out to Seth Bake at [facility], and [facility] as well as [facility] requesting a bed, The patient has been vaccinated 09/25/19, 10/22/10. 07/12/20. Awaiting bed offers        Expected Discharge Plan and Services           Expected Discharge Date: 01/06/21                                     Social Determinants of Health (SDOH) Interventions    Readmission Risk Interventions No flowsheet data found.

## 2021-01-05 NOTE — Progress Notes (Signed)
Physical Therapy Treatment Patient Details Name: Jack [name] MRN: [id number] DOB: [birth year] Today's Date: 01/05/2021    History of Present Illness 87 yo male with onset of LBP was found to have kidney stone, lithotripsy done with stone still present.  In acute renal failure.  Has newer sharp pain and found T12 compression fracture.  PMHx:  atherosclerosis, CKD3a, HTN, HLD, prostate hypertrophy    PT Comments    Ready for session.  Pre-medicated.  Pt is able to get to EOB with min a x 1 and cues for log rolling and hand placement.  Less pain today which resulted in overall improved sitting balance today.  Full assist to don brace.  Stood with elevated bed height and cues for hand placements.  Min a x 1 to stand with increased time for slower movements secondary to pain.  Once standing he is able to progress gait today to door of room with recliner follow as pt is limited by pain.  Cues to reach back for chair.  Discussed discharge plan.  Mobility remains slow to improve and continues to need significant cues for safety, and assist for balance and donning TLSO. Pt does voice he is unable to manage at home at this time and agree with his assessment of general safety and mobility,  He is open to SNF rehab to allow for increased mobility and a successful discharge home.  Will discuss with team.   Follow Up Recommendations  SNF     Equipment Recommendations  Rolling walker with 5" wheels;3in1 (PT)    Recommendations for Other Services       Precautions / Restrictions Precautions Precautions: Back;Fall Precaution Booklet Issued: No Precaution Comments: reviewed verbally Required Braces or Orthoses: Spinal Brace Spinal Brace: Thoracolumbosacral orthotic;Applied in sitting position Restrictions Weight Bearing Restrictions: No    Mobility  Bed Mobility Overal bed mobility: Needs Assistance Bed Mobility: Rolling;Sidelying to Sit Rolling: Min assist Sidelying to sit: Min assist        General bed mobility comments: better use of handrails today and less assist today.    Transfers Overall transfer level: Needs assistance Equipment used: Rolling walker (2 wheeled) Transfers: Sit to/from Stand Sit to Stand: Min assist         General transfer comment: less assist today  Ambulation/Gait Ambulation/Gait assistance: Min assist Gait Distance (Feet): 10 Feet Assistive device: Rolling walker (2 wheeled) Gait Pattern/deviations: Step-to pattern;Decreased stride length;Wide base of support Gait velocity: reduced   General Gait Details: recliner follow for safety and quick fatigue   Stairs             Wheelchair Mobility    Modified Rankin (Stroke Patients Only)       Balance Overall balance assessment: Needs assistance Sitting-balance support: Feet supported Sitting balance-Leahy Scale: Fair     Standing balance support: Bilateral upper extremity supported;During functional activity Standing balance-Leahy Scale: Poor Standing balance comment: heavy reliance on walker and unable to accept challenges                            Cognition Arousal/Alertness: Awake/alert Behavior During Therapy: WFL for tasks assessed/performed Overall Cognitive Status: Within Functional Limits for tasks assessed                                        Exercises      General  Comments        Pertinent Vitals/Pain Pain Assessment: Faces Faces Pain Scale: Hurts whole lot Pain Location: back Pain Descriptors / Indicators: Tender;Tightness Pain Intervention(s): Limited activity within patient's tolerance;Monitored during session;Repositioned    Home Living                      Prior Function            PT Goals (current goals can now be found in the care plan section) Progress towards PT goals: Progressing toward goals    Frequency    7X/week      PT Plan Current plan remains appropriate    Co-evaluation               AM-PAC PT "6 Clicks" Mobility   Outcome Measure  Help needed turning from your back to your side while in a flat bed without using bedrails?: A Little Help needed moving from lying on your back to sitting on the side of a flat bed without using bedrails?: A Lot Help needed moving to and from a bed to a chair (including a wheelchair)?: A Lot Help needed standing up from a chair using your arms (e.g., wheelchair or bedside chair)?: A Lot Help needed to walk in hospital room?: A Little Help needed climbing 3-5 steps with a railing? : Total 6 Click Score: 13    End of Session Equipment Utilized During Treatment: Gait belt Activity Tolerance: Patient tolerated treatment well;Patient limited by pain Patient left: in chair;with call bell/phone within reach;with chair alarm set;with family/visitor present Nurse Communication: Mobility status PT Visit Diagnosis: Unsteadiness on feet (R26.81);Difficulty in walking, not elsewhere classified (R26.2);Pain     Time: 0354-6568 PT Time Calculation (min) (ACUTE ONLY): 15 min  Charges:  $Gait Training: 8-22 mins                    Chesley Noon, PTA 01/05/21, 9:16 AM

## 2021-01-05 NOTE — Progress Notes (Signed)
Plan of care discussed with patient and family. Pain controlled with PRN medication. Pt states that he would like to sleep and not be awaken for pain medication.

## 2021-01-05 NOTE — Progress Notes (Signed)
PROGRESS NOTE    Jack [name]  UYQ:[id number] DOB: 1932/11/18 DOA: 01/02/2021 PCP: [name], MD   Assessment & Plan:   Principal Problem:   T12 compression fracture [facility]) Active Problems:   Essential hypertension   Prostate hypertrophy   Acute renal failure superimposed on stage 3a chronic kidney disease ([place])   Ureteral stone   T12 compression fracture: still has a lot of pain especially w/ moving. Etiology unclear. Morphine, percocet prn. Continue w/ TLSO brace. PT/OT recs SNF   HTN: continue on hydralazine   BPH: continue on home dose of tamsulosin   History of ureteral stone: s/p of ESWL & cystoscopy/urethral [place]/holmium laser/stent placement. Stent removed a weak ago. CT showed small lower pole right renal calculus but no obstructing ureteral calculi or bladder calculi.  AKI on CKDIIIa: Cr is trending up slightly from day prior. Will continue to monitor   Hypokalemia: WNL today. Will continue to monitor   DVT prophylaxis: heparin  Code Status: full  Family Communication:  Disposition Plan: d/c to SNF. CM is working on this   Level of care: Med-Surg   Status is: Inpatient  Remains inpatient appropriate because:Ongoing active pain requiring inpatient pain management and IV treatments appropriate due to intensity of illness or inability to take PO   Dispo: The patient is from: Home              Anticipated d/c is to: SNF              Patient currently is not medically stable to d/c.   Difficult to place patient: unclear          Consultants:   neurosurg   Procedures:   Antimicrobials:    Subjective: Pt c/o back pain especially with moving   Objective: Vitals:   01/04/21 1143 01/04/21 1601 01/04/21 2049 01/05/21 0444  BP: (!) 150/98 (!) 170/104 (!) 149/86 (!) 166/92  Pulse: 90 81 82 83  Resp: 16 16 17 18   Temp: 98.8 F (37.1 C) 98.2 F (36.8 C) 98.2 F (36.8 C) 98.8 F (37.1 C)  TempSrc:   Oral   SpO2: 97% 99% 96% 95%   Weight:      Height:        Intake/Output Summary (Last 24 hours) at 01/05/2021 0715 Last data filed at 01/05/2021 0327 Gross per 24 hour  Intake 3769.1 ml  Output 1700 ml  Net 2069.1 ml   Filed Weights   01/02/21 0853  Weight: 88.5 kg    Examination:  General exam: Appears uncomfortable  Respiratory system: clear breath sounds b/l. No rhonchi  Cardiovascular system: S1 & S2+. No rubs or clicks Gastrointestinal system: Abd is soft, NT, ND & hypoactive bowel sounds  Central nervous system: Alert and oriented. Moves all extremities Psychiatry: Judgement and insight appear normal. Appropriate mood and affect    Data Reviewed: I have personally reviewed following labs and imaging studies  CBC: Recent Labs  Lab 01/02/21 0857 01/03/21 0419 01/04/21 0509 01/05/21 0338  WBC 3.7* 4.3 4.1 3.7*  NEUTROABS 2.0  --   --   --   HGB 12.8* 12.6* 10.9* 11.4*  HCT 37.9* 37.0* 32.0* 35.0*  MCV 89.6 89.2 89.6 92.1  PLT 206 206 186 638   Basic Metabolic Panel: Recent Labs  Lab 01/02/21 0857 01/03/21 0419 01/04/21 0509 01/05/21 0338  NA 135 137 137 138  K 3.5 3.0* 3.4* 3.8  CL 98 100 106 105  CO2 24 24 23 25   GLUCOSE  116* 117* 100* 97  BUN 34* 29* 27* 28*  CREATININE 2.17* 2.00* 2.00* 2.06*  CALCIUM 9.9 9.3 9.3 9.3   GFR: Estimated Creatinine Clearance: 28.8 mL/min (A) (by C-G formula based on SCr of 2.06 mg/dL (H)). Liver Function Tests: Recent Labs  Lab 01/02/21 0857  AST 28  ALT 11  ALKPHOS 106  BILITOT 1.0  PROT 6.6  ALBUMIN 4.2   No results for input(s): LIPASE, AMYLASE in the last 168 hours. No results for input(s): AMMONIA in the last 168 hours. Coagulation Profile: Recent Labs  Lab 01/02/21 1410  INR 1.1   Cardiac Enzymes: No results for input(s): CKTOTAL, CKMB, CKMBINDEX, TROPONINI in the last 168 hours. BNP (last 3 results) No results for input(s): PROBNP in the last 8760 hours. HbA1C: No results for input(s): HGBA1C in the last 72  hours. CBG: No results for input(s): GLUCAP in the last 168 hours. Lipid Profile: No results for input(s): CHOL, HDL, LDLCALC, TRIG, CHOLHDL, LDLDIRECT in the last 72 hours. Thyroid Function Tests: No results for input(s): TSH, T4TOTAL, FREET4, T3FREE, THYROIDAB in the last 72 hours. Anemia Panel: No results for input(s): VITAMINB12, FOLATE, FERRITIN, TIBC, IRON, RETICCTPCT in the last 72 hours. Sepsis Labs: No results for input(s): PROCALCITON, LATICACIDVEN in the last 168 hours.  Recent Results (from the past 240 hour(s))  Urine culture     Status: None   Collection Time: 01/02/21 11:33 AM   Specimen: Urine, Random  Result Value Ref Range Status   Specimen Description   Final    URINE, RANDOM Performed at [facility], [address], [place], [place] [address]    Special Requests   Final    NONE Performed at [facility], [address], [place], [place] [address]    Culture   Final    NO GROWTH Performed at [facility], [place] [address] [place], [place] [address]    Report Status 01/03/2021 FINAL  Final  Resp Panel by RT-PCR (Flu A&B, Covid) Nasopharyngeal Swab     Status: None   Collection Time: 01/02/21 11:40 AM   Specimen: Nasopharyngeal Swab; Nasopharyngeal(NP) swabs in vial transport medium  Result Value Ref Range Status   SARS Coronavirus 2 by RT PCR NEGATIVE NEGATIVE Final    Comment: (NOTE) SARS-CoV-2 target nucleic acids are NOT DETECTED.  The SARS-CoV-2 RNA is generally detectable in upper respiratory specimens during the acute phase of infection. The lowest concentration of SARS-CoV-2 viral copies this assay can detect is 138 copies/mL. A negative result does not preclude SARS-Cov-2 infection and should not be used as the sole basis for treatment or other patient management decisions. A negative result may occur with  improper specimen collection/handling, submission of specimen other than nasopharyngeal swab, presence of viral  mutation(s) within the areas targeted by this assay, and inadequate number of viral copies(<138 copies/mL). A negative result must be combined with clinical observations, patient history, and epidemiological information. The expected result is Negative.  Fact Sheet for Patients:  EntrepreneurPulse.com.au  Fact Sheet for Healthcare Providers:  IncredibleEmployment.be  This test is no t yet approved or cleared by the [place] FDA and  has been authorized for detection and/or diagnosis of SARS-CoV-2 by FDA under an Emergency Use Authorization (EUA). This EUA will remain  in effect (meaning this test can be used) for the duration of the COVID-19 declaration under Section 564(b)(1) of the Act, 21 U.S.C.section 360bbb-3(b)(1), unless the authorization is terminated  or revoked sooner.  Influenza A by PCR NEGATIVE NEGATIVE Final   Influenza B by PCR NEGATIVE NEGATIVE Final    Comment: (NOTE) The Xpert Xpress SARS-CoV-2/FLU/RSV plus assay is intended as an aid in the diagnosis of influenza from Nasopharyngeal swab specimens and should not be used as a sole basis for treatment. Nasal washings and aspirates are unacceptable for Xpert Xpress SARS-CoV-2/FLU/RSV testing.  Fact Sheet for Patients: EntrepreneurPulse.com.au  Fact Sheet for Healthcare Providers: IncredibleEmployment.be  This test is not yet approved or cleared by the [place] FDA and has been authorized for detection and/or diagnosis of SARS-CoV-2 by FDA under an Emergency Use Authorization (EUA). This EUA will remain in effect (meaning this test can be used) for the duration of the COVID-19 declaration under Section 564(b)(1) of the Act, 21 U.S.C. section 360bbb-3(b)(1), unless the authorization is terminated or revoked.  Performed at [facility], [address], [place], [place] [address]          Radiology  Studies: No results found.      Scheduled Meds: . vitamin C  1,000 mg Oral Daily  . cholecalciferol  1,000 Units Oral Daily  . docusate sodium  200 mg Oral BID  . heparin  5,000 Units Subcutaneous Q8H  . hydrALAZINE  25 mg Oral Q8H  . magnesium oxide  400 mg Oral Daily  . multivitamin-lutein  1 capsule Oral BID  . senna  1 tablet Oral Daily  . tamsulosin  0.4 mg Oral Daily  . vitamin B-12  100 mcg Oral Daily  . zinc sulfate  220 mg Oral Daily   Continuous Infusions: . sodium chloride 75 mL/hr at 01/05/21 0327     LOS: 2 days    Time spent: 33 mins     Wyvonnia Dusky, MD [facility] Pager [phone]  If 7PM-7AM, please contact night-coverage 01/05/2021, 7:15 AM

## 2021-01-05 NOTE — TOC Progression Note (Signed)
Transition of Care [facility]) - Progression Note    Patient Details  Name: Jack [name] MRN: [id number] Date of Birth: 01/28/1933  Transition of Care [facility]) CM/SW [place], RN Phone Number: 01/05/2021, 3:37 PM  Clinical Narrative:    Called [facility]/ [facility] to start insurance process        Expected Discharge Plan and Services           Expected Discharge Date: 01/06/21                                     Social Determinants of Health (SDOH) Interventions    Readmission Risk Interventions No flowsheet data found.

## 2021-01-06 ENCOUNTER — Inpatient Hospital Stay (HOSPITAL_COMMUNITY)
Admit: 2021-01-06 | Discharge: 2021-01-06 | Disposition: A | Discharge status: Home / Self Care | Payer: PPO | Attending: Internal Medicine | Admitting: Internal Medicine

## 2021-01-06 DIAGNOSIS — I2584 Coronary atherosclerosis due to calcified coronary lesion: Secondary | ICD-10-CM

## 2021-01-06 DIAGNOSIS — I700 Atherosclerosis of aorta: Secondary | ICD-10-CM

## 2021-01-06 DIAGNOSIS — I471 Supraventricular tachycardia: Secondary | ICD-10-CM

## 2021-01-06 DIAGNOSIS — I491 Atrial premature depolarization: Secondary | ICD-10-CM

## 2021-01-06 DIAGNOSIS — I2510 Atherosclerotic heart disease of native coronary artery without angina pectoris: Secondary | ICD-10-CM

## 2021-01-06 LAB — BASIC METABOLIC PANEL
Anion gap: 7 (ref 5–15)
BUN: 33 mg/dL — ABNORMAL HIGH (ref 8–23)
CO2: 24 mmol/L (ref 22–32)
Calcium: 9.3 mg/dL (ref 8.9–10.3)
Chloride: 105 mmol/L (ref 98–111)
Creatinine, Ser: 2.04 mg/dL — ABNORMAL HIGH (ref 0.61–1.24)
GFR, Estimated: 31 mL/min — ABNORMAL LOW (ref >60)
Glucose, Bld: 104 mg/dL — ABNORMAL HIGH (ref 70–99)
Potassium: 3.8 mmol/L (ref 3.5–5.1)
Sodium: 136 mmol/L (ref 135–145)

## 2021-01-06 LAB — CBC
HCT: 31.2 % — ABNORMAL LOW (ref 39.0–52.0)
Hemoglobin: 10.7 g/dL — ABNORMAL LOW (ref 13.0–17.0)
MCH: 30.8 pg (ref 26.0–34.0)
MCHC: 34.3 g/dL (ref 30.0–36.0)
MCV: 89.9 fL (ref 80.0–100.0)
Platelets: 163 10*3/uL (ref 150–400)
RBC: 3.47 MIL/uL — ABNORMAL LOW (ref 4.22–5.81)
RDW: 14.3 % (ref 11.5–15.5)
WBC: 2.8 10*3/uL — ABNORMAL LOW (ref 4.0–10.5)
nRBC: 0 % (ref 0.0–0.2)

## 2021-01-06 MED ORDER — BISACODYL 10 MG RE SUPP
10.0000 mg | Freq: Once | RECTAL | Status: AC
  Administered 2021-01-06: 10 mg via RECTAL
  Filled 2021-01-06: qty 1

## 2021-01-06 NOTE — Consult Note (Signed)
Cardiology Consultation:   Patient ID: Jack [name] MRN: [id number]; DOB: 06/25/1933  Admit date: 01/02/2021 Date of Consult: 01/06/2021  PCP:  [name], MD   [place]  Cardiologist:  New - Kendle Erker  Patient Profile:   Jack [name] is a 87 y.o. male with a hx of hypertension, hyperlipidemia, GERD, chronic kidney disease stage III, BPH, and renal stones who is being seen today for the evaluation of possible atrial fibrillation at the request of Dr. Gwyndolyn Saxon.  History of Present Illness:   Jack [name] was admitted 4 days ago with right flank and mid back pain.  This had been an ongoing issue for several weeks initially treated with lithotripsy of a 1.5 cm right ureteral stone.  However, after the procedure, Jack [name] continued to have back pain with repeat stone intervention on 4/5.  Despite this, he continued to have significant pain and was ultimately found to have a compression fracture of T12.  He has been hospitalized for the last 4 days for pain control and physical therapy.  No surgical intervention was recommended by neurosurgery.  Jack [name] was noted to have tachycardia with an irregular rhythm on telemetry yesterday.  He was asymptomatic, denying chest pain, shortness of breath, palpitations, and lightheadedness.  He denies a history of arrhythmia and other heart disease.  Patient is concerned that having missed some of his pain medications as scheduled yesterday afternoon followed by receiving morphine and oxycodone in short succession may have triggered the arrhythmia.   Past Medical History:  Diagnosis Date  . Elevated prostate specific antigen (PSA)    has been 7 for a year   . GERD (gastroesophageal reflux disease)   . History of colon polyps 2008   [facility],   . History of kidney stones   . Hyperlipidemia   . Hypertension   . Prostate hypertrophy    diagnosed at age 29 due to hematospermia  . Renal disorder     Past Surgical  History:  Procedure Laterality Date  . CATARACT EXTRACTION W/PHACO Left 01/10/2018   Procedure: CATARACT EXTRACTION PHACO AND INTRAOCULAR LENS PLACEMENT (IOC);  Surgeon: Birder Robson, MD;  Location: [facility] ORS;  Service: Ophthalmology;  Laterality: Left;  [name] 00:24.8 AP% 14.9 CDE 3.68 Fluid pack lot # [id number]  . CATARACT EXTRACTION W/PHACO Right 01/25/2018   Procedure: CATARACT EXTRACTION PHACO AND INTRAOCULAR LENS PLACEMENT (IOC);  Surgeon: Birder Robson, MD;  Location: [facility] ORS;  Service: Ophthalmology;  Laterality: Right;  [name] 00:42 AP% 10.8 CDE 4.59 Fluid pack lot # [id number]  . COLON SURGERY    . CYSTOSCOPY W/ URETERAL STENT PLACEMENT Right 10/16/2017   Procedure: right  URETERAL STENT PLACEMENT,cystoscopy bilateral stent removal,rretrograde;  Surgeon: Abbie Sons, MD;  Location: [facility] ORS;  Service: Urology;  Laterality: Right;  . CYSTOSCOPY/URETEROSCOPY/HOLMIUM LASER/STENT PLACEMENT Right 12/16/2020   Procedure: CYSTOSCOPY/URETEROSCOPY/HOLMIUM LASER/STENT PLACEMENT;  Surgeon: Abbie Sons, MD;  Location: [facility] ORS;  Service: Urology;  Laterality: Right;  . EXTRACORPOREAL SHOCK WAVE LITHOTRIPSY Right 12/11/2020   Procedure: EXTRACORPOREAL SHOCK WAVE LITHOTRIPSY (ESWL);  Surgeon: Abbie Sons, MD;  Location: [facility] ORS;  Service: Urology;  Laterality: Right;  . KIDNEY STONE SURGERY    . RESECTION SOFT TISSUE TUMOR LEG / ANKLE RADICAL  jan 2009   [facility],  right thigh/knee , nonmalignant  . SMALL INTESTINE SURGERY  1946   implaed on picket fence, punctured stomach  . TONSILLECTOMY       Home Medications:  Prior to Admission  medications   Medication Sig Start Date Maghan Jessee Date Taking? Authorizing Provider  Ascorbic Acid (VITAMIN C) 1000 MG tablet Take 1,000 mg by mouth daily.   Yes [provider]  beta carotene 25000 UNIT capsule Take 25,000 Units by mouth daily.   Yes [provider]  cholecalciferol (VITAMIN D) 1000 UNITS tablet Take 1,000 Units by mouth daily.    Yes [provider]  Cyanocobalamin (VITAMIN B-12 PO) Take 1 tablet by mouth daily.   Yes [provider]  losartan-hydrochlorothiazide (HYZAAR) 50-12.5 MG per tablet Take 1 tablet by mouth daily. 09/16/14  Yes [name], MD  magnesium oxide (MAG-OX) 400 MG tablet Take by mouth.   Yes [provider]  Misc Natural Products (OSTEO BI-FLEX ADV DOUBLE ST PO) Take 1 tablet by mouth 2 (two) times daily.    Yes [provider]  Multiple Vitamins-Minerals (PRESERVISION AREDS 2 PO) Take 1 capsule by mouth 2 (two) times daily.    Yes [provider]  tamsulosin (FLOMAX) 0.4 MG CAPS capsule Take 1 capsule (0.4 mg total) by mouth daily. 12/15/20  Yes McGowan, Larene Beach A, PA-C  Turmeric 500 MG CAPS Take 500 mg by mouth 2 (two) times daily.    Yes [provider]  Zinc 50 MG CAPS Take 50 mg by mouth daily.    Yes [provider]  HYDROmorphone (DILAUDID) 2 MG tablet Take 1 tablet (2 mg total) by mouth every 4 (four) hours as needed for severe pain. Patient not taking: No sig reported 12/15/20   Zara Council A, PA-C  trospium (SANCTURA) 20 MG tablet TAKE 1 TABLET BY MOUTH TWICE A DAY Patient not taking: Reported on 01/02/2021 06/03/20   Nori Riis, PA-C    Inpatient Medications: Scheduled Meds: . vitamin C  1,000 mg Oral Daily  . cholecalciferol  1,000 Units Oral Daily  . docusate sodium  200 mg Oral BID  . heparin  5,000 Units Subcutaneous Q8H  . hydrALAZINE  25 mg Oral Q8H  . magnesium oxide  400 mg Oral Daily  . metoprolol tartrate  25 mg Oral BID  . multivitamin-lutein  1 capsule Oral BID  . senna  1 tablet Oral Daily  . tamsulosin  0.4 mg Oral Daily  . vitamin B-12  100 mcg Oral Daily  . zinc sulfate  220 mg Oral Daily   Continuous Infusions:  PRN Meds: acetaminophen, hydrALAZINE, methocarbamol, morphine injection, ondansetron (ZOFRAN) IV, oxyCODONE-acetaminophen, polyethylene glycol  Allergies:    Allergies   Allergen Reactions  . Amlodipine Other (See Comments)    LE edema  . Azithromycin Nausea And Vomiting  . Lisinopril Cough    Social History:   Social History   Tobacco Use  . Smoking status: Former Smoker    Quit date: 07/05/1965    Years since quitting: 55.5  . Smokeless tobacco: Never Used  Vaping Use  . Vaping Use: Never used  Substance Use Topics  . Alcohol use: Yes    Alcohol/week: 1.0 standard drink    Types: 1 Standard drinks or equivalent per week    Comment: occassionaly  . Drug use: No     Family History:   Family History  Problem Relation Age of Onset  . Hypertension Father   . Hyperlipidemia Father   . Cancer Sister        thyroid - dx in late 20's     ROS:  Please see the history of present illness. All other ROS reviewed and negative.  Physical Exam/Data:   Vitals:   01/06/21 0603 01/06/21 0849 01/06/21 1127 01/06/21 1617  BP: 136/74 (!) 167/98 (!) 129/91 (!) 155/99  Pulse: 66 68 76 85  Resp: 17 15 15 16   Temp: (!) 97.4 F (36.3 C) 98.2 F (36.8 C) 98.2 F (36.8 C) 98.4 F (36.9 C)  TempSrc: Oral Oral Oral Oral  SpO2: 94% 97% 98% 97%  Weight:      Height:        Intake/Output Summary (Last 24 hours) at 01/06/2021 1704 Last data filed at 01/06/2021 1400 Gross per 24 hour  Intake 240 ml  Output 1300 ml  Net -1060 ml   Last 3 Weights 01/02/2021 12/25/2020 12/16/2020  Weight (lbs) 195 lb 200 lb 9.6 oz 206 lb  Weight (kg) 88.451 kg 90.992 kg 93.441 kg     Body mass index is 28.8 kg/m.  General:  Well nourished, well developed, in no acute distress. HEENT: normal Lymph: no adenopathy Neck: no JVD Endocrine:  No thryomegaly Vascular: No carotid bruits; FA pulses 2+ bilaterally without bruits  Cardiac: Cardiac exam somewhat limited by back brace.  Heart sounds are regular without murmurs, rubs, or gallops Lungs:  clear to auscultation bilaterally, no wheezing, rhonchi or rales  Abd: Unable to assess due to back brace in place. Ext: no  edema Musculoskeletal:  No deformities, BUE and BLE strength normal and equal Skin: warm and dry  Neuro:  CNs 2-12 intact, no focal abnormalities noted Psych:  Normal affect   EKG:  The EKG was personally reviewed and demonstrates: Normal sinus rhythm with left anterior fascicular block.  Otherwise, no significant abnormality.  No prior tracing available for comparison. Telemetry:  Telemetry was personally reviewed and demonstrates: Rhythm was normal sinus rhythm.  There was a period of sinus rhythm with frequent PACs and possible multifocal atrial tachycardia yesterday afternoon.  No definite evidence of atrial fibrillation identified.  Relevant CV Studies: None  Laboratory Data:  High Sensitivity Troponin:  No results for input(s): TROPONINIHS in the last 720 hours.   Chemistry Recent Labs  Lab 01/04/21 0509 01/05/21 0338 01/06/21 0452  NA 137 138 136  K 3.4* 3.8 3.8  CL 106 105 105  CO2 23 25 24   GLUCOSE 100* 97 104*  BUN 27* 28* 33*  CREATININE 2.00* 2.06* 2.04*  CALCIUM 9.3 9.3 9.3  GFRNONAA 32* 31* 31*  ANIONGAP 8 8 7     Recent Labs  Lab 01/02/21 0857  PROT 6.6  ALBUMIN 4.2  AST 28  ALT 11  ALKPHOS 106  BILITOT 1.0   Hematology Recent Labs  Lab 01/04/21 0509 01/05/21 0338 01/06/21 0452  WBC 4.1 3.7* 2.8*  RBC 3.57* 3.80* 3.47*  HGB 10.9* 11.4* 10.7*  HCT 32.0* 35.0* 31.2*  MCV 89.6 92.1 89.9  MCH 30.5 30.0 30.8  MCHC 34.1 32.6 34.3  RDW 14.1 14.3 14.3  PLT 186 175 163   BNPNo results for input(s): BNP, PROBNP in the last 168 hours.  DDimer No results for input(s): DDIMER in the last 168 hours.   Radiology/Studies:  No results found.   Assessment and Plan:   PACs and MAT: Incidentally noted on telemetry without associated symptoms.  Currently, Jack [name] remains in sinus rhythm.  EKG shows LAFB but otherwise no significant abnormality.  I do not see evidence of atrial fibrillation.  Obtain echocardiogram.  Continue telemetry monitoring  overnight.  If no evidence of atrial fibrillation, it would be reasonable to place a 14-day event monitor at  discharge.  No indication for anticoagulation.  Given the lack of symptoms, I think it is also reasonable to defer standing beta-blocker administration.  Coronary artery calcification and aortic atherosclerosis: Incidentally noted on recent unenhanced CT of the abdomen and pelvis.  Jack [name] is without symptoms of coronary insufficiency.  I think it is reasonable to defer further work-up at this time and to continue medical therapy to prevent progression of ASCVD (i.e. blood pressure and lipid control).   For questions or updates, please contact [place] Please consult [email or web address] for contact info under [facility].  Signed, Nelva Bush, MD  01/06/2021 5:04 PM

## 2021-01-06 NOTE — TOC Progression Note (Signed)
Transition of Care [facility]) - Progression Note    Patient Details  Name: Jack [name] MRN: [id number] Date of Birth: [birth year]  Transition of [facility]) CM/SW Contact  Su Hilt, RN Phone Number: 01/06/2021, 8:47 AM  Clinical Narrative:   Seth Bake with [facility] notified me that they would be able to accept the patient, I spoke with the patient and he wants to accept the bed, Notified Seth Bake that the bed is accepted, awaiting insurance approval         Expected Discharge Plan and Services           Expected Discharge Date: 01/06/21                                     Social Determinants of Health (SDOH) Interventions    Readmission Risk Interventions No flowsheet data found.

## 2021-01-06 NOTE — Progress Notes (Signed)
Physical Therapy Treatment Patient Details Name: Jack [name] MRN: [id number] DOB: November 10, 1932 Today's Date: 01/06/2021    History of Present Illness 87 yo male with onset of LBP was found to have kidney stone, lithotripsy done with stone still present.  In acute renal failure.  Has newer sharp pain and found T12 compression fracture.  PMHx:  atherosclerosis, CKD3a, HTN, HLD, prostate hypertrophy    PT Comments    Ready for session.  Improved bed mobility today.  Uses rails but good log roll technique and no outside assist.  He continues to need help with donning brace.  Stated he practiced with his wife yesterday but they had trouble.  Reviewed while donning.  Stood with min a  X 1 and once gained balance, he is able to progress gait from step to pattern to step through and complete lap around unit.  Significant improvement and overall tolerance today.  SNF is still recommended at this time.  Pain continues to affect balance and mobility.  He and wife remain unable to properly don brace.  SNF will allow for continued education and fostering independence for a safe discharge home.   Follow Up Recommendations  SNF     Equipment Recommendations  Rolling walker with 5" wheels;3in1 (PT)    Recommendations for Other Services       Precautions / Restrictions Precautions Precautions: Back;Fall Precaution Booklet Issued: No Precaution Comments: reviewed verbally Required Braces or Orthoses: Spinal Brace Spinal Brace: Thoracolumbosacral orthotic;Applied in sitting position Restrictions Weight Bearing Restrictions: No    Mobility  Bed Mobility Overal bed mobility: Needs Assistance Bed Mobility: Rolling;Sidelying to Sit Rolling: Min guard Sidelying to sit: Min guard;HOB elevated       General bed mobility comments: improved ability today, slow due to pain with heavy use of handrails but no outside assist    Transfers Overall transfer level: Needs assistance Equipment used:  Rolling walker (2 wheeled) Transfers: Sit to/from Stand Sit to Stand: Min assist;Min guard            Ambulation/Gait Ambulation/Gait assistance: Min Web designer (Feet): 160 Feet Assistive device: Rolling walker (2 wheeled) Gait Pattern/deviations: Step-to pattern;Decreased stride length;Wide base of support;Step-through pattern Gait velocity: reduced   General Gait Details: able to progress gait to complete lap with step through pattern at end of session.   Stairs             Wheelchair Mobility    Modified Rankin (Stroke Patients Only)       Balance Overall balance assessment: Needs assistance Sitting-balance support: Feet supported Sitting balance-Leahy Scale: Fair     Standing balance support: Bilateral upper extremity supported;During functional activity   Standing balance comment: improved today                            Cognition Arousal/Alertness: Awake/alert Behavior During Therapy: WFL for tasks assessed/performed Overall Cognitive Status: Within Functional Limits for tasks assessed                                        Exercises      General Comments        Pertinent Vitals/Pain Pain Assessment: Faces Faces Pain Scale: Hurts little more Pain Location: back Pain Descriptors / Indicators: Tender;Tightness Pain Intervention(s): Limited activity within patient's tolerance;Monitored during session;Repositioned    Home Living  Prior Function            PT Goals (current goals can now be found in the care plan section) Progress towards PT goals: Progressing toward goals    Frequency    7X/week      PT Plan Current plan remains appropriate    Co-evaluation              AM-PAC PT "6 Clicks" Mobility   Outcome Measure  Help needed turning from your back to your side while in a flat bed without using bedrails?: A Little Help needed moving from lying on  your back to sitting on the side of a flat bed without using bedrails?: A Little Help needed moving to and from a bed to a chair (including a wheelchair)?: A Little Help needed standing up from a chair using your arms (e.g., wheelchair or bedside chair)?: A Little Help needed to walk in hospital room?: A Little Help needed climbing 3-5 steps with a railing? : A Lot 6 Click Score: 17    End of Session Equipment Utilized During Treatment: Gait belt Activity Tolerance: Patient tolerated treatment well;Patient limited by pain Patient left: in chair;with call bell/phone within reach;with chair alarm set;with family/visitor present Nurse Communication: Mobility status PT Visit Diagnosis: Unsteadiness on feet (R26.81);Difficulty in walking, not elsewhere classified (R26.2);Pain     Time: 6681-5947 PT Time Calculation (min) (ACUTE ONLY): 15 min  Charges:  $Gait Training: 8-22 mins                    Chesley Noon, PTA 01/06/21, 9:44 AM

## 2021-01-06 NOTE — Progress Notes (Signed)
Occupational Therapy Treatment Patient Details Name: Jack [name] MRN: [id number] DOB: 12-09-1932 Today's Date: 01/06/2021    History of present illness 87 yo male with onset of LBP was found to have kidney stone, lithotripsy done with stone still present.  In acute renal failure.  Has newer sharp pain and found T12 compression fracture.  PMHx:  atherosclerosis, CKD3a, HTN, HLD, prostate hypertrophy   OT comments  Jack [name] was seen for OT treatment on this date. Upon arrival to room pt seated in chair, agreeable to tx but defers OOB mobility 2/2 fatigue following PT session. Pt requires MIN A + VCs don TLSO seated in chair - pt required physical assist to reach back straps and VCs for sequencing. Pt reports his spoude recently tweaked her knee and is limited in physical asssitance that she can provide. Pt deferred OOB mobility citing fatigue from PT session eariler this AM. SETUP shampoo cap in sitting. Pt instructed on functional application of back precautions for comfort, home/routines modifications, and falls prevention. Pt making good progress toward goals. Pt continues to benefit from skilled OT services to maximize return to PLOF and minimize risk of future falls, injury, caregiver burden, and readmission. Will continue to follow POC. Discharge recommendation remains appropriate as pt continues to require assist to manage brace.   Follow Up Recommendations  SNF    Equipment Recommendations  None recommended by OT    Recommendations for Other Services      Precautions / Restrictions Precautions Precautions: Back;Fall Precaution Booklet Issued: No Precaution Comments: reviewed verbally Required Braces or Orthoses: Spinal Brace Spinal Brace: Thoracolumbosacral orthotic;Applied in sitting position Restrictions Weight Bearing Restrictions: No       Mobility Bed Mobility     General bed mobility comments: received and left in chair             Balance Overall balance  assessment: Needs assistance Sitting-balance support: Feet supported Sitting balance-Leahy Scale: Fair     Standing balance support: Bilateral upper extremity supported;During functional activity   Standing balance comment: improved today                           ADL either performed or assessed with clinical judgement   ADL Overall ADL's : Needs assistance/impaired                                       General ADL Comments: MIN A + VCs don TLSO seated in chair - pt required physical assist to reach back straps and VCs for sequencing. Pt reports his spoude recently tweaked her knee and is limited in physical asssitance that she can provide. Pt deferred OOB mobility citing fatigue from PT session eariler this AM. SETUP shampoo cap in sitting               Cognition Arousal/Alertness: Awake/alert Behavior During Therapy: WFL for tasks assessed/performed Overall Cognitive Status: Within Functional Limits for tasks assessed                                          Exercises Exercises: Other exercises Other Exercises Other Exercises: Pt educated re: OT role, DME recs, d/c recs, falls prevention, functional application of back precautions, home/routines modifications Other Exercises: LBD, grooming, don/doff brace  Pertinent Vitals/ Pain       Pain Assessment: Faces Faces Pain Scale: Hurts a little bit Pain Location: back Pain Descriptors / Indicators: Tender;Tightness Pain Intervention(s): Limited activity within patient's tolerance;Monitored during session;Repositioned         Frequency  Min 1X/week        Progress Toward Goals  OT Goals(current goals can now be found in the care plan section)  Progress towards OT goals: Progressing toward goals  Acute Rehab OT Goals Patient Stated Goal: to hurt less OT Goal Formulation: With patient ADL Goals Pt Will Perform Lower Body Dressing: with modified  independence Pt Will Transfer to Toilet: with modified independence  Plan Discharge plan remains appropriate;Frequency remains appropriate       AM-PAC OT "6 Clicks" Daily Activity     Outcome Measure   Help from another person eating meals?: None Help from another person taking care of personal grooming?: None Help from another person toileting, which includes using toliet, bedpan, or urinal?: A Lot Help from another person bathing (including washing, rinsing, drying)?: A Lot Help from another person to put on and taking off regular upper body clothing?: A Little Help from another person to put on and taking off regular lower body clothing?: A Little 6 Click Score: 18    End of Session    OT Visit Diagnosis: Muscle weakness (generalized) (M62.81)   Activity Tolerance Patient tolerated treatment well   Patient Left in chair;with call bell/phone within reach;with chair alarm set   Nurse Communication          Time: 9147-8295 OT Time Calculation (min): 23 min  Charges: OT General Charges $OT Visit: 1 Visit OT Treatments $Self Care/Home Management : 23-37 mins  Dessie Coma, M.S. OTR/L  01/06/21, 1:33 PM  ascom [phone]

## 2021-01-06 NOTE — Progress Notes (Signed)
Patient and wife educated about cardio consult. Heart rate. And issues from yesterday. State understanding at this time. Wife asked about insurance approval for snf placement. I spoke to [place] case manager and explained approval was not received yet

## 2021-01-06 NOTE — Progress Notes (Signed)
PROGRESS NOTE   HPI was taken from Dr. Blaine Hamper: Jack [name] is a 87 y.o. male with medical history significant of hypertension, hyperlipidemia, GERD, CKD stage IIIa, BPH, kidney stone, who presents with right flank and mid back pain.  Pt underwent ESWL on03/31/2020for a 1.5 cm right proximal ureteral stonewith Dr.Stoioff.after the procedure, patient continued have right back pain. KUB showed that pt still had stone present. He then underwent cystoscopy/urethral Sk[place]skie/holmium laser/stent placement by Dr. Bernardo Heater on 12/16/20. Stent was already removed a weak ago.   Patient states that he continues to have pain in the right flank and mid of his back, associated with muscle spasms sometimes.  The pain is constant, sharp, 10 out of 10 severity, nonradiating.  Patient cannot walk normally due to severe pain.  No loss control of bladder or bowel movement.  No leg numbness. Patient has nausea, no vomiting, diarrhea or abdominal pain.  Patient is constipated.  Denies chest pain, cough, shortness of breath.  Denies symptoms of UTI.  No hematuria.  His urologist, Dr. Bernardo Heater recommended patient come to emergency room for CT scan.   ED Course: pt was found to have WBC 3.7, pending COVID-19 PCR, worsening renal function with creatinine 2.17, BUN 34 (baseline creatinine 1.5-1.6 recently), pending UA. temperature normal, blood pressure 173/95, heart rate 95, 69, RR 20, oxygen saturation 99% on room air. Patient is placed on MedSurg bed for observation. CT scan showed new T12 compression fracture with mild retropulsion but no significant canal compromise. Dr. Lacinda Axon of neurosurgery will be consulted.  CT-renal stone: 1. Small lower pole right renal calculus but no obstructing ureteral calculi or bladder calculi. 2. No worrisome renal or bladder lesions without contrast. 3. No acute abdominal/pelvic findings, mass lesions or adenopathy. 4. Stable advanced atherosclerotic calcifications involving  the aorta and branch vessels. 5. New T12 compression fracture with mild retropulsion but no significant canal compromise. This may be responsible for the patient's back pain. 6. Stable moderate-sized hiatal hernia. 7. Stable prostate gland enlargement with median lobe hypertrophy impressing on the base of the bladder. 8. Aortic atherosclerosis.   Hospital course from Dr. Jimmye Norman 4/23-4/26/22: Pt was found to have T12 compression fracture of unknown etiology. Denies any recent falls and/or trauma. Neurosurg recs conservative management w/ TLSO brace and PT/OT. PT/OT recs SNF. CM is working on SNF placement. Of note, there was concern for possible a. fib on tele yesterday so pt was started on metoprolol BID. EKG today shows NSR. Cardio consulted.     Jack [name]  RAQ:[id number] DOB: 16-May-1933 DOA: 01/02/2021 PCP: [name], MD   Assessment & Plan:   Principal Problem:   T12 compression fracture [facility]) Active Problems:   Essential hypertension   Prostate hypertrophy   Acute renal failure superimposed on stage 3a chronic kidney disease ([place])   Ureteral stone   T12 compression fracture: still has a lot of pain especially w/ moving. Etiology unclear. Morphine, percocet prn. Continue w/ TLSO brace. PT/OT recs SNF   Possible a. fib: on tele yesterday evening. Started on metoprolol BID. EKG today shows NSR. Cardio consulted.   HTN: continue on home dose of hydralazine and started on metoprolol yesterday   BPH: continue on home dose of tamsulosin   History of ureteral stone: s/p of ESWL & cystoscopy/urethral Skulskie/holmium laser/stent placement. Stent removed a weak ago. CT showed small lower pole right renal calculus but no obstructing ureteral calculi or bladder calculi.  AKI on CKDIIIa: Cr is trending down slightly from day  prior. Avoid nephrotoxic meds   Hypokalemia: WNL today   DVT prophylaxis: heparin  Code Status: full  Family Communication:  Disposition Plan:  d/c to SNF. CM is working on this   Level of care: Med-Surg   Status is: Inpatient  Remains inpatient appropriate because:Ongoing active pain requiring inpatient pain management and IV treatments appropriate due to intensity of illness or inability to take PO   Dispo: The patient is from: Home              Anticipated d/c is to: SNF              Patient currently is not medically stable to d/c.   Difficult to place patient: unclear          Consultants:   neurosurg  Cardio, Dr. Saunders Revel   Procedures:   Antimicrobials:    Subjective: Pt still c/o back pain with moving  Objective: Vitals:   01/05/21 1157 01/05/21 1613 01/05/21 2052 01/06/21 0603  BP: (!) 146/95 (!) 155/94 (!) 151/93 136/74  Pulse: 91 (!) 107 81 66  Resp: 16 16 17 17   Temp: 98.5 F (36.9 C) 98.4 F (36.9 C) 98.2 F (36.8 C) (!) 97.4 F (36.3 C)  TempSrc: Oral Oral Oral Oral  SpO2: 97% 96% 98% 94%  Weight:      Height:        Intake/Output Summary (Last 24 hours) at 01/06/2021 0725 Last data filed at 01/06/2021 0600 Gross per 24 hour  Intake 240 ml  Output 700 ml  Net -460 ml   Filed Weights   01/02/21 0853  Weight: 88.5 kg    Examination:  General exam: Appears comfortable  Respiratory system: clear breath sounds b/l.  Cardiovascular system: S1/S2+. No rubs or gallops  Gastrointestinal system: Abd is soft, NT,ND & hypoactive bowel sounds  Central nervous system: Alert and oriented. Moves all extremities  Psychiatry: Judgement and insight appear normal. Appropriate mood and affect    Data Reviewed: I have personally reviewed following labs and imaging studies  CBC: Recent Labs  Lab 01/02/21 0857 01/03/21 0419 01/04/21 0509 01/05/21 0338 01/06/21 0452  WBC 3.7* 4.3 4.1 3.7* 2.8*  NEUTROABS 2.0  --   --   --   --   HGB 12.8* 12.6* 10.9* 11.4* 10.7*  HCT 37.9* 37.0* 32.0* 35.0* 31.2*  MCV 89.6 89.2 89.6 92.1 89.9  PLT 206 206 186 175 811   Basic Metabolic Panel: Recent  Labs  Lab 01/02/21 0857 01/03/21 0419 01/04/21 0509 01/05/21 0338 01/06/21 0452  NA 135 137 137 138 136  K 3.5 3.0* 3.4* 3.8 3.8  CL 98 100 106 105 105  CO2 24 24 23 25 24   GLUCOSE 116* 117* 100* 97 104*  BUN 34* 29* 27* 28* 33*  CREATININE 2.17* 2.00* 2.00* 2.06* 2.04*  CALCIUM 9.9 9.3 9.3 9.3 9.3   GFR: Estimated Creatinine Clearance: 29.1 mL/min (A) (by C-G formula based on SCr of 2.04 mg/dL (H)). Liver Function Tests: Recent Labs  Lab 01/02/21 0857  AST 28  ALT 11  ALKPHOS 106  BILITOT 1.0  PROT 6.6  ALBUMIN 4.2   No results for input(s): LIPASE, AMYLASE in the last 168 hours. No results for input(s): AMMONIA in the last 168 hours. Coagulation Profile: Recent Labs  Lab 01/02/21 1410  INR 1.1   Cardiac Enzymes: No results for input(s): CKTOTAL, CKMB, CKMBINDEX, TROPONINI in the last 168 hours. BNP (last 3 results) No results for input(s): PROBNP in the  last 8760 hours. HbA1C: No results for input(s): HGBA1C in the last 72 hours. CBG: No results for input(s): GLUCAP in the last 168 hours. Lipid Profile: No results for input(s): CHOL, HDL, LDLCALC, TRIG, CHOLHDL, LDLDIRECT in the last 72 hours. Thyroid Function Tests: No results for input(s): TSH, T4TOTAL, FREET4, T3FREE, THYROIDAB in the last 72 hours. Anemia Panel: No results for input(s): VITAMINB12, FOLATE, FERRITIN, TIBC, IRON, RETICCTPCT in the last 72 hours. Sepsis Labs: No results for input(s): PROCALCITON, LATICACIDVEN in the last 168 hours.  Recent Results (from the past 240 hour(s))  Urine culture     Status: None   Collection Time: 01/02/21 11:33 AM   Specimen: Urine, Random  Result Value Ref Range Status   Specimen Description   Final    URINE, RANDOM Performed at [facility], [address], [place], [place] [address]    Special Requests   Final    NONE Performed at [facility], [address] [place], [place] [address]    Culture   Final    NO GROWTH Performed  at [facility], [place] [address], [place], [place] [address]    Report Status 01/03/2021 FINAL  Final  Resp Panel by RT-PCR (Flu A&B, Covid) Nasopharyngeal Swab     Status: None   Collection Time: 01/02/21 11:40 AM   Specimen: Nasopharyngeal Swab; Nasopharyngeal(NP) swabs in vial transport medium  Result Value Ref Range Status   SARS Coronavirus 2 by RT PCR NEGATIVE NEGATIVE Final    Comment: (NOTE) SARS-CoV-2 target nucleic acids are NOT DETECTED.  The SARS-CoV-2 RNA is generally detectable in upper respiratory specimens during the acute phase of infection. The lowest concentration of SARS-CoV-2 viral copies this assay can detect is 138 copies/mL. A negative result does not preclude SARS-Cov-2 infection and should not be used as the sole basis for treatment or other patient management decisions. A negative result may occur with  improper specimen collection/handling, submission of specimen other than nasopharyngeal swab, presence of viral mutation(s) within the areas targeted by this assay, and inadequate number of viral copies(<138 copies/mL). A negative result must be combined with clinical observations, patient history, and epidemiological information. The expected result is Negative.  Fact Sheet for Patients:  EntrepreneurPulse.com.au  Fact Sheet for Healthcare Providers:  IncredibleEmployment.be  This test is no t yet approved or cleared by the [place] FDA and  has been authorized for detection and/or diagnosis of SARS-CoV-2 by FDA under an Emergency Use Authorization (EUA). This EUA will remain  in effect (meaning this test can be used) for the duration of the COVID-19 declaration under Section 564(b)(1) of the Act, 21 U.S.C.section 360bbb-3(b)(1), unless the authorization is terminated  or revoked sooner.       Influenza A by PCR NEGATIVE NEGATIVE Final   Influenza B by PCR NEGATIVE NEGATIVE Final    Comment: (NOTE) The  Xpert Xpress SARS-CoV-2/FLU/RSV plus assay is intended as an aid in the diagnosis of influenza from Nasopharyngeal swab specimens and should not be used as a sole basis for treatment. Nasal washings and aspirates are unacceptable for Xpert Xpress SARS-CoV-2/FLU/RSV testing.  Fact Sheet for Patients: EntrepreneurPulse.com.au  Fact Sheet for Healthcare Providers: IncredibleEmployment.be  This test is not yet approved or cleared by the [place] FDA and has been authorized for detection and/or diagnosis of SARS-CoV-2 by FDA under an Emergency Use Authorization (EUA). This EUA will remain in effect (meaning this test can be used) for the duration of the COVID-19 declaration  under Section 564(b)(1) of the Act, 21 U.S.C. section 360bbb-3(b)(1), unless the authorization is terminated or revoked.  Performed at [facility], [address] [place], [place] [address]          Radiology Studies: No results found.      Scheduled Meds: . vitamin C  1,000 mg Oral Daily  . cholecalciferol  1,000 Units Oral Daily  . docusate sodium  200 mg Oral BID  . heparin  5,000 Units Subcutaneous Q8H  . hydrALAZINE  25 mg Oral Q8H  . magnesium oxide  400 mg Oral Daily  . metoprolol tartrate  25 mg Oral BID  . multivitamin-lutein  1 capsule Oral BID  . senna  1 tablet Oral Daily  . tamsulosin  0.4 mg Oral Daily  . vitamin B-12  100 mcg Oral Daily  . zinc sulfate  220 mg Oral Daily   Continuous Infusions: . sodium chloride 75 mL/hr at 01/05/21 0327     LOS: 3 days    Time spent: 35 mins     Wyvonnia Dusky, MD [facility] Pager [phone]  If 7PM-7AM, please contact night-coverage 01/06/2021, 7:25 AM

## 2021-01-06 NOTE — TOC Progression Note (Signed)
Transition of Care [facility]) - Progression Note    Patient Details  Name: Jack [name] MRN: [id number] Date of Birth: [birth year]  Transition of Care [facility]) CM/SW Contact  Su Hilt, RN Phone Number: 01/06/2021, 2:37 PM  Clinical Narrative:   Received a call from HTN/THN with approval for [facility] Auth number [id number], and for [facility] auth number [id number], notified the patient the wife and the physician        Expected Discharge Plan and Services           Expected Discharge Date: 01/06/21                                     Social Determinants of Health (SDOH) Interventions    Readmission Risk Interventions No flowsheet data found.

## 2021-01-07 ENCOUNTER — Other Ambulatory Visit: Payer: Self-pay | Admitting: Physician Assistant

## 2021-01-07 ENCOUNTER — Inpatient Hospital Stay (HOSPITAL_COMMUNITY)
Admit: 2021-01-07 | Discharge: 2021-01-07 | Disposition: A | Discharge status: Home / Self Care | Payer: PPO | Attending: Physician Assistant | Admitting: Physician Assistant

## 2021-01-07 DIAGNOSIS — N189 Chronic kidney disease, unspecified: Secondary | ICD-10-CM

## 2021-01-07 DIAGNOSIS — M4854XD Collapsed vertebra, not elsewhere classified, thoracic region, subsequent encounter for fracture with routine healing: Secondary | ICD-10-CM | POA: Insufficient documentation

## 2021-01-07 DIAGNOSIS — R279 Unspecified lack of coordination: Secondary | ICD-10-CM | POA: Diagnosis not present

## 2021-01-07 DIAGNOSIS — M4854XA Collapsed vertebra, not elsewhere classified, thoracic region, initial encounter for fracture: Secondary | ICD-10-CM | POA: Insufficient documentation

## 2021-01-07 DIAGNOSIS — S22080D Wedge compression fracture of T11-T12 vertebra, subsequent encounter for fracture with routine healing: Secondary | ICD-10-CM

## 2021-01-07 DIAGNOSIS — I480 Paroxysmal atrial fibrillation: Secondary | ICD-10-CM | POA: Diagnosis not present

## 2021-01-07 DIAGNOSIS — N401 Enlarged prostate with lower urinary tract symptoms: Secondary | ICD-10-CM | POA: Diagnosis not present

## 2021-01-07 DIAGNOSIS — I700 Atherosclerosis of aorta: Secondary | ICD-10-CM | POA: Diagnosis not present

## 2021-01-07 DIAGNOSIS — I340 Nonrheumatic mitral (valve) insufficiency: Secondary | ICD-10-CM | POA: Insufficient documentation

## 2021-01-07 DIAGNOSIS — M6281 Muscle weakness (generalized): Secondary | ICD-10-CM | POA: Diagnosis not present

## 2021-01-07 DIAGNOSIS — R52 Pain, unspecified: Secondary | ICD-10-CM | POA: Diagnosis not present

## 2021-01-07 DIAGNOSIS — I4891 Unspecified atrial fibrillation: Secondary | ICD-10-CM | POA: Diagnosis not present

## 2021-01-07 DIAGNOSIS — M5414 Radiculopathy, thoracic region: Secondary | ICD-10-CM | POA: Diagnosis not present

## 2021-01-07 DIAGNOSIS — I10 Essential (primary) hypertension: Secondary | ICD-10-CM

## 2021-01-07 DIAGNOSIS — D649 Anemia, unspecified: Secondary | ICD-10-CM

## 2021-01-07 DIAGNOSIS — N1832 Chronic kidney disease, stage 3b: Secondary | ICD-10-CM | POA: Diagnosis not present

## 2021-01-07 DIAGNOSIS — R9431 Abnormal electrocardiogram [ECG] [EKG]: Secondary | ICD-10-CM

## 2021-01-07 DIAGNOSIS — N179 Acute kidney failure, unspecified: Secondary | ICD-10-CM | POA: Diagnosis not present

## 2021-01-07 DIAGNOSIS — E876 Hypokalemia: Secondary | ICD-10-CM

## 2021-01-07 DIAGNOSIS — N138 Other obstructive and reflux uropathy: Secondary | ICD-10-CM | POA: Insufficient documentation

## 2021-01-07 DIAGNOSIS — N1831 Chronic kidney disease, stage 3a: Secondary | ICD-10-CM | POA: Diagnosis not present

## 2021-01-07 DIAGNOSIS — R002 Palpitations: Secondary | ICD-10-CM

## 2021-01-07 DIAGNOSIS — N200 Calculus of kidney: Secondary | ICD-10-CM | POA: Diagnosis not present

## 2021-01-07 DIAGNOSIS — S22080A Wedge compression fracture of T11-T12 vertebra, initial encounter for closed fracture: Secondary | ICD-10-CM | POA: Diagnosis not present

## 2021-01-07 DIAGNOSIS — I129 Hypertensive chronic kidney disease with stage 1 through stage 4 chronic kidney disease, or unspecified chronic kidney disease: Secondary | ICD-10-CM | POA: Insufficient documentation

## 2021-01-07 DIAGNOSIS — I499 Cardiac arrhythmia, unspecified: Secondary | ICD-10-CM | POA: Diagnosis not present

## 2021-01-07 DIAGNOSIS — N400 Enlarged prostate without lower urinary tract symptoms: Secondary | ICD-10-CM | POA: Diagnosis not present

## 2021-01-07 DIAGNOSIS — S22000A Wedge compression fracture of unspecified thoracic vertebra, initial encounter for closed fracture: Secondary | ICD-10-CM | POA: Insufficient documentation

## 2021-01-07 DIAGNOSIS — R5381 Other malaise: Secondary | ICD-10-CM | POA: Diagnosis not present

## 2021-01-07 DIAGNOSIS — K219 Gastro-esophageal reflux disease without esophagitis: Secondary | ICD-10-CM | POA: Diagnosis not present

## 2021-01-07 DIAGNOSIS — R2681 Unsteadiness on feet: Secondary | ICD-10-CM | POA: Diagnosis not present

## 2021-01-07 LAB — BASIC METABOLIC PANEL
Anion gap: 10 (ref 5–15)
BUN: 32 mg/dL — ABNORMAL HIGH (ref 8–23)
CO2: 24 mmol/L (ref 22–32)
Calcium: 9.3 mg/dL (ref 8.9–10.3)
Chloride: 101 mmol/L (ref 98–111)
Creatinine, Ser: 1.97 mg/dL — ABNORMAL HIGH (ref 0.61–1.24)
GFR, Estimated: 33 mL/min — ABNORMAL LOW (ref >60)
Glucose, Bld: 102 mg/dL — ABNORMAL HIGH (ref 70–99)
Potassium: 3.6 mmol/L (ref 3.5–5.1)
Sodium: 135 mmol/L (ref 135–145)

## 2021-01-07 LAB — RESP PANEL BY RT-PCR (FLU A&B, COVID) ARPGX2
Influenza A by PCR: NEGATIVE
Influenza B by PCR: NEGATIVE
SARS Coronavirus 2 by RT PCR: NEGATIVE

## 2021-01-07 LAB — CBC
HCT: 30.8 % — ABNORMAL LOW (ref 39.0–52.0)
Hemoglobin: 10.4 g/dL — ABNORMAL LOW (ref 13.0–17.0)
MCH: 30.5 pg (ref 26.0–34.0)
MCHC: 33.8 g/dL (ref 30.0–36.0)
MCV: 90.3 fL (ref 80.0–100.0)
Platelets: 171 10*3/uL (ref 150–400)
RBC: 3.41 MIL/uL — ABNORMAL LOW (ref 4.22–5.81)
RDW: 14.2 % (ref 11.5–15.5)
WBC: 2.8 10*3/uL — ABNORMAL LOW (ref 4.0–10.5)
nRBC: 0 % (ref 0.0–0.2)

## 2021-01-07 LAB — ECHOCARDIOGRAM COMPLETE
Area-P 1/2: 3.21 cm2
Height: 69 in
P 1/2 time: 453 msec
S' Lateral: 3.39 cm
Weight: 3120 oz

## 2021-01-07 MED ORDER — DOCUSATE SODIUM 100 MG PO CAPS: 100.0000 mg | ORAL_CAPSULE | Freq: Two times a day (BID) | ORAL | 0 refills | Status: DC

## 2021-01-07 MED ORDER — HYDRALAZINE HCL 25 MG PO TABS: 25.0000 mg | ORAL_TABLET | Freq: Three times a day (TID) | ORAL | 0 refills | Status: DC

## 2021-01-07 MED ORDER — ATORVASTATIN CALCIUM 40 MG PO TABS: 40.0000 mg | ORAL_TABLET | Freq: Every day | ORAL | 0 refills | Status: DC

## 2021-01-07 MED ORDER — ACETAMINOPHEN 325 MG PO TABS: 650.0000 mg | ORAL_TABLET | Freq: Four times a day (QID) | ORAL | Status: DC | PRN

## 2021-01-07 MED ORDER — POLYETHYLENE GLYCOL 3350 17 G PO PACK: 17.0000 g | PACK | Freq: Every day | ORAL | 0 refills | Status: DC | PRN

## 2021-01-07 MED ORDER — OXYCODONE-ACETAMINOPHEN 5-325 MG PO TABS: 1.0000 | ORAL_TABLET | Freq: Four times a day (QID) | ORAL | 0 refills | Status: DC | PRN

## 2021-01-07 NOTE — TOC Progression Note (Signed)
Transition of Care [facility]) - Progression Note    Patient Details  Name: Jack [name] MRN: [id number] Date of Birth: [birth year]  Transition of Care [facility]) CM/SW Contact  Su Hilt, RN Phone Number: 01/07/2021, 12:18 PM  Clinical Narrative:   DC packet on the cahrt ready for EMS transport to [facility] room 104, the nurse to call report to [facility] and was provided with the number to call, I called EMS and arranged [facility], They will be here to transport between [phone]. Nurse aware         Expected Discharge Plan and Services           Expected Discharge Date: 01/07/21                                     Social Determinants of Health (SDOH) Interventions    Readmission Risk Interventions No flowsheet data found.

## 2021-01-07 NOTE — Plan of Care (Signed)
States  understanding ready for discharge

## 2021-01-07 NOTE — Discharge Summary (Signed)
[facility] NAME: Jack [name]    MR#:  [id number]  DATE OF BIRTH:  [birth year]  DATE OF ADMISSION:  01/02/2021 ADMITTING PHYSICIAN: Wyvonnia Dusky, MD  DATE OF DISCHARGE: 01/07/2021  PRIMARY CARE PHYSICIAN: [name], MD    ADMISSION DIAGNOSIS:  T12 compression fracture ([place]) [A19.379K] Compression fracture of T12 vertebra, initial encounter ([place]) [W40.973Z]  DISCHARGE DIAGNOSIS:  Principal Problem:   T12 compression fracture ([place]) Active Problems:   Essential hypertension   Prostate hypertrophy   Acute renal failure superimposed on stage 3a chronic kidney disease ([place])   Ureteral stone   SECONDARY DIAGNOSIS:   Past Medical History:  Diagnosis Date  . Elevated prostate specific antigen (PSA)    has been 7 for a year   . GERD (gastroesophageal reflux disease)   . History of colon polyps 2008   [facility],   . History of kidney stones   . Hyperlipidemia   . Hypertension   . Prostate hypertrophy    diagnosed at age 74 due to hematospermia  . Renal disorder     HOSPITAL COURSE:   1.  T12 compression fracture.  Patient states that he is doing a little bit better and pain a little bit less.  Seen by neurosurgery and recommended conservative management.  Continue TLSO brace.  Physical therapy recommending rehab.  If the patient continues to have a lot of pain can refer to Dr. Rudene Christians orthopedic surgery for consideration for kyphoplasty. 2.  Possibility of atrial fibrillation on telemetry.  Seen by cardiology.  Echocardiogram ordered and reading currently pending at this time.  Cardiology will have monitor placed prior to disposition today. 3.  Acute kidney injury on chronic kidney disease stage IIIb.  Continue holding ARB and recheck creatinine in 1 week.  Creatinine came down from 2.17 down to 1.97.  Consider nephrology consultation as outpatient 4.  Essential hypertension.  Patient is on hydralazine.  Holding  ARB and hydrochlorothiazide. 5.  BPH on Flomax 6.  History of ureteral stone in the past and stent removed 7.  Hypokalemia.  Potassium normal range today 8.  Anemia with hemoglobin of 10.4 upon discharge.  No signs of bleeding.  Likely down with IV fluids during the hospital course. 9.  Aorta atherosclerosis.  Patient okay with starting Lipitor.  Benefits and risk explained.  DISCHARGE CONDITIONS:   Satisfactory  CONSULTS OBTAINED:  Treatment Team:  Nelva Bush, MD  DRUG ALLERGIES:   Allergies  Allergen Reactions  . Amlodipine Other (See Comments)    LE edema  . Azithromycin Nausea And Vomiting  . Lisinopril Cough    DISCHARGE MEDICATIONS:   Allergies as of 01/07/2021      Reactions   Amlodipine Other (See Comments)   LE edema   Azithromycin Nausea And Vomiting   Lisinopril Cough      Medication List    STOP taking these medications   beta carotene 25000 UNIT capsule   HYDROmorphone 2 MG tablet Commonly known as: Dilaudid   losartan-hydrochlorothiazide 50-12.5 MG tablet Commonly known as: HYZAAR   OSTEO BI-FLEX ADV DOUBLE ST PO   trospium 20 MG tablet Commonly known as: SANCTURA   Turmeric 500 MG Caps     TAKE these medications   acetaminophen 325 MG tablet Commonly known as: TYLENOL Take 2 tablets (650 mg total) by mouth every 6 (six) hours as needed for fever, headache or moderate pain.   atorvastatin 40 MG tablet Commonly known as:  Lipitor Take 1 tablet (40 mg total) by mouth daily at 6 PM.   cholecalciferol 1000 units tablet Commonly known as: VITAMIN D Take 1,000 Units by mouth daily.   docusate sodium 100 MG capsule Commonly known as: COLACE Take 1 capsule (100 mg total) by mouth 2 (two) times daily.   hydrALAZINE 25 MG tablet Commonly known as: APRESOLINE Take 1 tablet (25 mg total) by mouth every 8 (eight) hours.   magnesium oxide 400 MG tablet Commonly known as: MAG-OX Take by mouth.   oxyCODONE-acetaminophen 5-325 MG  tablet Commonly known as: PERCOCET/ROXICET Take 1 tablet by mouth every 6 (six) hours as needed for severe pain.   polyethylene glycol 17 g packet Commonly known as: MIRALAX / GLYCOLAX Take 17 g by mouth daily as needed for mild constipation.   PRESERVISION AREDS 2 PO Take 1 capsule by mouth 2 (two) times daily.   tamsulosin 0.4 MG Caps capsule Commonly known as: FLOMAX Take 1 capsule (0.4 mg total) by mouth daily.   VITAMIN B-12 PO Take 1 tablet by mouth daily.   vitamin C 1000 MG tablet Take 1,000 mg by mouth daily.   Zinc 50 MG Caps Take 50 mg by mouth daily.        DISCHARGE INSTRUCTIONS:   Follow-up team at rehab 1 day Follow-up cardiology 2 weeks  If you experience worsening of your admission symptoms, develop shortness of breath, life threatening emergency, suicidal or homicidal thoughts you must seek medical attention immediately by calling 911 or calling your MD immediately  if symptoms less severe.  You Must read complete instructions/literature along with all the possible adverse reactions/side effects for all the Medicines you take and that have been prescribed to you. Take any new Medicines after you have completely understood and accept all the possible adverse reactions/side effects.   Please note  You were cared for by a hospitalist during your hospital stay. If you have any questions about your discharge medications or the care you received while you were in the hospital after you are discharged, you can call the unit and asked to speak with the hospitalist on call if the hospitalist that took care of you is not available. Once you are discharged, your primary care physician will handle any further medical issues. Please note that NO REFILLS for any discharge medications will be authorized once you are discharged, as it is imperative that you return to your primary care physician (or establish a relationship with a primary care physician if you do not have one)  for your aftercare needs so that they can reassess your need for medications and monitor your lab values.    Today   CHIEF COMPLAINT:   Chief Complaint  Patient presents with  . Flank Pain    HISTORY OF PRESENT ILLNESS:  Jack [name]  is a 87 y.o. male with a known history of came in with back and flank pain   VITAL SIGNS:  Blood pressure (!) 156/88, pulse 67, temperature 98 F (36.7 C), resp. rate 15, height 5\' 9"  (1.753 m), weight 88.5 kg, SpO2 98 %.  I/O:    Intake/Output Summary (Last 24 hours) at 01/07/2021 1009 Last data filed at 01/07/2021 0743 Gross per 24 hour  Intake 360 ml  Output 1975 ml  Net -1615 ml    PHYSICAL EXAMINATION:  GENERAL:  87 y.o.-year-old patient lying in the bed with no acute distress.  EYES: Pupils equal, round, reactive to light and accommodation. No scleral icterus.  HEENT: Head atraumatic, normocephalic. Oropharynx and nasopharynx clear. .  LUNGS: Normal breath sounds bilaterally, no wheezing, rales,rhonchi or crepitation. No use of accessory muscles of respiration.  CARDIOVASCULAR: S1, S2 normal. No murmurs, rubs, or gallops.  ABDOMEN: Soft, non-tender, non-distended.  EXTREMITIES: No pedal edema.  NEUROLOGIC: Cranial nerves II through XII are intact.  Able to straight leg legs without a problem. PSYCHIATRIC: The patient is alert and oriented x 3.  SKIN: No obvious rash, lesion, or ulcer.   DATA REVIEW:   CBC Recent Labs  Lab 01/07/21 0429  WBC 2.8*  HGB 10.4*  HCT 30.8*  PLT 171    Chemistries  Recent Labs  Lab 01/02/21 0857 01/03/21 0419 01/07/21 0429  NA 135   < > 135  K 3.5   < > 3.6  CL 98   < > 101  CO2 24   < > 24  GLUCOSE 116*   < > 102*  BUN 34*   < > 32*  CREATININE 2.17*   < > 1.97*  CALCIUM 9.9   < > 9.3  AST 28  --   --   ALT 11  --   --   ALKPHOS 106  --   --   BILITOT 1.0  --   --    < > = values in this interval not displayed.    Microbiology Results  Results for orders placed or performed  during the hospital encounter of 01/02/21  Urine culture     Status: None   Collection Time: 01/02/21 11:33 AM   Specimen: Urine, Random  Result Value Ref Range Status   Specimen Description   Final    URINE, RANDOM Performed at [facility], [address], [place], [place] [address]    Special Requests   Final    NONE Performed at [facility], [address], [place], [place] [address]    Culture   Final    NO GROWTH Performed at [facility], [place] [address] [place], [place] [address]    Report Status 01/03/2021 FINAL  Final  Resp Panel by RT-PCR (Flu A&B, Covid) Nasopharyngeal Swab     Status: None   Collection Time: 01/02/21 11:40 AM   Specimen: Nasopharyngeal Swab; Nasopharyngeal(NP) swabs in vial transport medium  Result Value Ref Range Status   SARS Coronavirus 2 by RT PCR NEGATIVE NEGATIVE Final    Comment: (NOTE) SARS-CoV-2 target nucleic acids are NOT DETECTED.  The SARS-CoV-2 RNA is generally detectable in upper respiratory specimens during the acute phase of infection. The lowest concentration of SARS-CoV-2 viral copies this assay can detect is 138 copies/mL. A negative result does not preclude SARS-Cov-2 infection and should not be used as the sole basis for treatment or other patient management decisions. A negative result may occur with  improper specimen collection/handling, submission of specimen other than nasopharyngeal swab, presence of viral mutation(s) within the areas targeted by this assay, and inadequate number of viral copies(<138 copies/mL). A negative result must be combined with clinical observations, patient history, and epidemiological information. The expected result is Negative.  Fact Sheet for Patients:  EntrepreneurPulse.com.au  Fact Sheet for Healthcare Providers:  IncredibleEmployment.be  This test is no t yet approved or cleared by the [place] FDA and  has been  authorized for detection and/or diagnosis of SARS-CoV-2 by FDA under an Emergency Use Authorization (EUA). This EUA will remain  in effect (meaning this test can be used) for the duration of  the COVID-19 declaration under Section 564(b)(1) of the Act, 21 U.S.C.section 360bbb-3(b)(1), unless the authorization is terminated  or revoked sooner.       Influenza A by PCR NEGATIVE NEGATIVE Final   Influenza B by PCR NEGATIVE NEGATIVE Final    Comment: (NOTE) The Xpert Xpress SARS-CoV-2/FLU/RSV plus assay is intended as an aid in the diagnosis of influenza from Nasopharyngeal swab specimens and should not be used as a sole basis for treatment. Nasal washings and aspirates are unacceptable for Xpert Xpress SARS-CoV-2/FLU/RSV testing.  Fact Sheet for Patients: EntrepreneurPulse.com.au  Fact Sheet for Healthcare Providers: IncredibleEmployment.be  This test is not yet approved or cleared by the [place] FDA and has been authorized for detection and/or diagnosis of SARS-CoV-2 by FDA under an Emergency Use Authorization (EUA). This EUA will remain in effect (meaning this test can be used) for the duration of the COVID-19 declaration under Section 564(b)(1) of the Act, 21 U.S.C. section 360bbb-3(b)(1), unless the authorization is terminated or revoked.  Performed at [facility], [address] [place], [place] [address]      Management plans discussed with the patient, family and they are in agreement.  CODE STATUS:     Code Status Orders  (From admission, onward)         Start     Ordered   01/02/21 1205  Full code  Continuous        01/02/21 1205        Code Status History    Date Active Date Inactive Code Status Order ID Comments User Context   10/16/2017 0914 10/16/2017 1705 Full Code [id number]  Saundra Shelling, MD Inpatient   Advance Care Planning Activity    Advance Directive Documentation   Flowsheet Row Most Recent  Value  Type of Advance Directive Living will, Healthcare Power of Attorney  Pre-existing out of facility DNR order (yellow form or pink MOST form) --  "MOST" Form in Place? --      TOTAL TIME TAKING CARE OF THIS PATIENT: 35 minutes.    Loletha Grayer M.D on 01/07/2021 at 10:09 AM  Between 7am to 6pm - Pager - [phone]  After 6pm go to [email or web address] - password [email or web address]  [facility]  CC: Primary care physician; [name], MD

## 2021-01-07 NOTE — Progress Notes (Signed)
Monitor to be placed before discharge for possible atrial fibrillation.

## 2021-01-07 NOTE — Care Management Important Message (Signed)
Important Message  Patient Details  Name: Jack [name] MRN: [id number] Date of Birth: 06/17/1933   Medicare Important Message Given:  Yes     Jack [name] 01/07/2021, 10:29 AM

## 2021-01-07 NOTE — Progress Notes (Signed)
Progress Note  Patient Name: Jack [name] Date of Encounter: 01/07/2021  [facility] Cardiologist: Dr. Saunders Revel  Subjective   Denies chest pain, shortness of breath or palpitations.  Still has significant back pain.  Inpatient Medications    Scheduled Meds: . vitamin C  1,000 mg Oral Daily  . cholecalciferol  1,000 Units Oral Daily  . docusate sodium  200 mg Oral BID  . heparin  5,000 Units Subcutaneous Q8H  . hydrALAZINE  25 mg Oral Q8H  . magnesium oxide  400 mg Oral Daily  . multivitamin-lutein  1 capsule Oral BID  . senna  1 tablet Oral Daily  . tamsulosin  0.4 mg Oral Daily  . vitamin B-12  100 mcg Oral Daily  . zinc sulfate  220 mg Oral Daily   Continuous Infusions:  PRN Meds: acetaminophen, hydrALAZINE, methocarbamol, morphine injection, ondansetron (ZOFRAN) IV, oxyCODONE-acetaminophen, polyethylene glycol   Vital Signs    Vitals:   01/06/21 1617 01/06/21 1954 01/07/21 0346 01/07/21 0802  BP: (!) 155/99 (!) 168/96 139/87 (!) 156/88  Pulse: 85 77 64 67  Resp: 16 18 18 15   Temp: 98.4 F (36.9 C) 98.8 F (37.1 C) 98.1 F (36.7 C) 98 F (36.7 C)  TempSrc: Oral  Oral   SpO2: 97% 96% 96% 98%  Weight:      Height:        Intake/Output Summary (Last 24 hours) at 01/07/2021 1126 Last data filed at 01/07/2021 0743 Gross per 24 hour  Intake 120 ml  Output 1975 ml  Net -1855 ml   Last 3 Weights 01/02/2021 12/25/2020 12/16/2020  Weight (lbs) 195 lb 200 lb 9.6 oz 206 lb  Weight (kg) 88.451 kg 90.992 kg 93.441 kg      Telemetry    Sinus rhythm- Personally Reviewed  ECG    No new tracing- Personally Reviewed  Physical Exam   GEN: No acute distress.   Neck: No JVD Cardiac: RRR, no murmurs, rubs, or gallops.  Respiratory: Clear to auscultation bilaterally. GI: Soft, nontender, non-distended  MS: No edema; lower back tender with movements. Neuro:  Nonfocal  Psych: Normal affect   Labs    High Sensitivity Troponin:  No results for input(s):  TROPONINIHS in the last 720 hours.    Chemistry Recent Labs  Lab 01/02/21 0857 01/03/21 0419 01/05/21 0338 01/06/21 0452 01/07/21 0429  NA 135   < > 138 136 135  K 3.5   < > 3.8 3.8 3.6  CL 98   < > 105 105 101  CO2 24   < > 25 24 24   GLUCOSE 116*   < > 97 104* 102*  BUN 34*   < > 28* 33* 32*  CREATININE 2.17*   < > 2.06* 2.04* 1.97*  CALCIUM 9.9   < > 9.3 9.3 9.3  PROT 6.6  --   --   --   --   ALBUMIN 4.2  --   --   --   --   AST 28  --   --   --   --   ALT 11  --   --   --   --   ALKPHOS 106  --   --   --   --   BILITOT 1.0  --   --   --   --   GFRNONAA 29*   < > 31* 31* 33*  ANIONGAP 13   < > 8 7 10    < > =  values in this interval not displayed.     Hematology Recent Labs  Lab 01/05/21 0338 01/06/21 0452 01/07/21 0429  WBC 3.7* 2.8* 2.8*  RBC 3.80* 3.47* 3.41*  HGB 11.4* 10.7* 10.4*  HCT 35.0* 31.2* 30.8*  MCV 92.1 89.9 90.3  MCH 30.0 30.8 30.5  MCHC 32.6 34.3 33.8  RDW 14.3 14.3 14.2  PLT 175 163 171    BNPNo results for input(s): BNP, PROBNP in the last 168 hours.   DDimer No results for input(s): DDIMER in the last 168 hours.   Radiology    ECHOCARDIOGRAM COMPLETE  Result Date: 01/07/2021    ECHOCARDIOGRAM REPORT   Patient Name:   Jack [name] [facility] Date of Exam: 01/06/2021 Medical Rec #:  [id number]       Height:       69.0 in Accession #:    [id number]      Weight:       195.0 lb Date of Birth:  1933-01-12      BSA:          2.044 m Patient Age:    87 years        BP:           155/99 mmHg Patient Gender: M               HR:           84 bpm. Exam Location:  [facility] Procedure: 2D Echo, Cardiac Doppler and Color Doppler Indications:     Multifocal atrial tachycardia  History:         Patient has no prior history of Echocardiogram examinations.                  Risk Factors:Hypertension and Dyslipidemia.  Sonographer:     Wilford Sports Rodgers-Jones Referring Phys:  [id number] CHRISTOPHER END Diagnosing Phys: Kate Sable MD IMPRESSIONS  1. Left ventricular ejection  fraction, by estimation, is 50 to 55%. The left ventricle has low normal function. The left ventricle has no regional wall motion abnormalities. There is mild left ventricular hypertrophy. Left ventricular diastolic parameters are consistent with Grade I diastolic dysfunction (impaired relaxation).  2. Right ventricular systolic function is normal. The right ventricular size is normal.  3. The mitral valve is normal in structure. No evidence of mitral valve regurgitation.  4. The aortic valve is tricuspid. Aortic valve regurgitation is mild.  5. Aortic dilatation noted. There is moderate dilatation of the ascending aorta, measuring 48 mm. There is mild dilatation of the aortic root, measuring 40 mm.  6. The inferior vena cava is normal in size with greater than 50% respiratory variability, suggesting right atrial pressure of 3 mmHg. FINDINGS  Left Ventricle: Left ventricular ejection fraction, by estimation, is 50 to 55%. The left ventricle has low normal function. The left ventricle has no regional wall motion abnormalities. The left ventricular internal cavity size was normal in size. There is mild left ventricular hypertrophy. Left ventricular diastolic parameters are consistent with Grade I diastolic dysfunction (impaired relaxation). Right Ventricle: The right ventricular size is normal. No increase in right ventricular wall thickness. Right ventricular systolic function is normal. Left Atrium: Left atrial size was normal in size. Right Atrium: Right atrial size was normal in size. Pericardium: There is no evidence of pericardial effusion. Mitral Valve: The mitral valve is normal in structure. No evidence of mitral valve regurgitation. Tricuspid Valve: The tricuspid valve is normal in structure. Tricuspid valve regurgitation is mild. Aortic Valve: The aortic valve  is tricuspid. Aortic valve regurgitation is mild. Aortic regurgitation PHT measures 453 msec. Pulmonic Valve: The pulmonic valve was not well  visualized. Pulmonic valve regurgitation is trivial. Aorta: Aortic dilatation noted. There is moderate dilatation of the ascending aorta, measuring 48 mm. There is mild dilatation of the aortic root, measuring 40 mm. Venous: The inferior vena cava is normal in size with greater than 50% respiratory variability, suggesting right atrial pressure of 3 mmHg. IAS/Shunts: No atrial level shunt detected by color flow Doppler.  LEFT VENTRICLE PLAX 2D LVIDd:         4.78 cm  Diastology LVIDs:         3.39 cm  LV e' medial:    5.44 cm/s LV PW:         1.09 cm  LV E/e' medial:  9.9 LV IVS:        1.09 cm  LV e' lateral:   4.35 cm/s LVOT diam:     2.00 cm  LV E/e' lateral: 12.3 LV SV:         68 LV SV Index:   33 LVOT Area:     3.14 cm  RIGHT VENTRICLE             IVC RV Basal diam:  3.62 cm     IVC diam: 1.87 cm RV S prime:     14.50 cm/s TAPSE (M-mode): 2.4 cm LEFT ATRIUM             Index       RIGHT ATRIUM           Index LA diam:        3.80 cm 1.86 cm/m  RA Area:     11.10 cm LA Vol (A2C):   43.0 ml 21.04 ml/m RA Volume:   22.60 ml  11.06 ml/m LA Vol (A4C):   32.6 ml 15.95 ml/m LA Biplane Vol: 38.3 ml 18.74 ml/m  AORTIC VALVE LVOT Vmax:   114.00 cm/s LVOT Vmean:  83.900 cm/s LVOT VTI:    0.215 m AI PHT:      453 msec  AORTA Ao Root diam: 4.00 cm Ao Asc diam:  4.80 cm MITRAL VALVE               TRICUSPID VALVE MV Area (PHT): 3.21 cm    TR Peak grad:   23.0 mmHg MV Decel Time: 236 msec    TR Vmax:        240.00 cm/s MV E velocity: 53.60 cm/s MV A velocity: 96.80 cm/s  SHUNTS MV E/A ratio:  0.55        Systemic VTI:  0.22 m                            Systemic Diam: 2.00 cm Kate Sable MD Electronically signed by Kate Sable MD Signature Date/Time: 01/07/2021/11:11:49 AM    Final     Cardiac Studies   Echocardiogram 01/06/2021 1. Left ventricular ejection fraction, by estimation, is 50 to 55%. The  left ventricle has low normal function. The left ventricle has no regional  wall motion abnormalities.  There is mild left ventricular hypertrophy.  Left ventricular diastolic  parameters are consistent with Grade I diastolic dysfunction (impaired  relaxation).  2. Right ventricular systolic function is normal. The right ventricular  size is normal.  3. The mitral valve is normal in structure. No evidence of mitral valve  regurgitation.  4. The  aortic valve is tricuspid. Aortic valve regurgitation is mild.  5. Aortic dilatation noted. There is moderate dilatation of the ascending  aorta, measuring 48 mm. There is mild dilatation of the aortic root,  measuring 40 mm.  6. The inferior vena cava is normal in size with greater than 50%  respiratory variability, suggesting right atrial pressure of 3 mmHg.  Patient Profile     87 y.o. male Hypertension, hyperlipidemia, CKD 3 who presents due to back pain, found to have compression fracture of T12.  Being seen for irregular heartbeat.  Assessment & Plan    1.  Irregular heart rates -Telemetry monitor has been sinus throughout the appointment. -Echo with preserved ejection fraction, moderate to severely dilated ascending aorta. -Place cardiac monitor on discharge to evaluate presence of A. fib or flutter.  2.  Moderate to severely dilated ascending aorta measuring 4.8 cm -Close monitoring as outpatient with serial echocardiograms.  3.  Hypertension, -Restart PTA losartan.   Total encounter time 35 minutes  Greater than 50% was spent in counseling and coordination of care with the patient    Signed, Kate Sable, MD  01/07/2021, 11:26 AM

## 2021-01-07 NOTE — Progress Notes (Signed)
[place], RN at [employer] was given report via phone Sbar completely covered and IV removed/instructions given/ patient states understanding and will monitor until EMS arrives

## 2021-01-07 NOTE — Progress Notes (Signed)
Patient discharge. Went over paper work states understanding. Patient paperwork added to packet and givin to ems. Pt left at 330pm

## 2021-01-08 DIAGNOSIS — N1831 Chronic kidney disease, stage 3a: Secondary | ICD-10-CM | POA: Diagnosis not present

## 2021-01-08 DIAGNOSIS — I480 Paroxysmal atrial fibrillation: Secondary | ICD-10-CM | POA: Diagnosis not present

## 2021-01-08 DIAGNOSIS — K219 Gastro-esophageal reflux disease without esophagitis: Secondary | ICD-10-CM | POA: Diagnosis not present

## 2021-01-08 DIAGNOSIS — I700 Atherosclerosis of aorta: Secondary | ICD-10-CM | POA: Diagnosis not present

## 2021-01-08 DIAGNOSIS — I10 Essential (primary) hypertension: Secondary | ICD-10-CM | POA: Diagnosis not present

## 2021-01-08 DIAGNOSIS — N400 Enlarged prostate without lower urinary tract symptoms: Secondary | ICD-10-CM | POA: Diagnosis not present

## 2021-01-08 DIAGNOSIS — S22080D Wedge compression fracture of T11-T12 vertebra, subsequent encounter for fracture with routine healing: Secondary | ICD-10-CM | POA: Diagnosis not present

## 2021-01-12 DIAGNOSIS — M5414 Radiculopathy, thoracic region: Secondary | ICD-10-CM | POA: Diagnosis not present

## 2021-01-12 DIAGNOSIS — I10 Essential (primary) hypertension: Secondary | ICD-10-CM | POA: Diagnosis not present

## 2021-01-15 DIAGNOSIS — R002 Palpitations: Secondary | ICD-10-CM | POA: Diagnosis not present

## 2021-01-16 ENCOUNTER — Ambulatory Visit: Payer: PPO | Admitting: Physician Assistant

## 2021-01-16 DIAGNOSIS — S22080A Wedge compression fracture of T11-T12 vertebra, initial encounter for closed fracture: Secondary | ICD-10-CM | POA: Diagnosis not present

## 2021-01-20 DIAGNOSIS — R2681 Unsteadiness on feet: Secondary | ICD-10-CM | POA: Insufficient documentation

## 2021-01-22 DIAGNOSIS — N1832 Chronic kidney disease, stage 3b: Secondary | ICD-10-CM | POA: Diagnosis not present

## 2021-01-22 DIAGNOSIS — N400 Enlarged prostate without lower urinary tract symptoms: Secondary | ICD-10-CM | POA: Diagnosis not present

## 2021-01-22 DIAGNOSIS — Z86010 Personal history of colonic polyps: Secondary | ICD-10-CM | POA: Diagnosis not present

## 2021-01-22 DIAGNOSIS — I4891 Unspecified atrial fibrillation: Secondary | ICD-10-CM | POA: Diagnosis not present

## 2021-01-22 DIAGNOSIS — I700 Atherosclerosis of aorta: Secondary | ICD-10-CM | POA: Diagnosis not present

## 2021-01-22 DIAGNOSIS — Z87891 Personal history of nicotine dependence: Secondary | ICD-10-CM | POA: Diagnosis not present

## 2021-01-22 DIAGNOSIS — N201 Calculus of ureter: Secondary | ICD-10-CM | POA: Diagnosis not present

## 2021-01-22 DIAGNOSIS — D631 Anemia in chronic kidney disease: Secondary | ICD-10-CM | POA: Diagnosis not present

## 2021-01-22 DIAGNOSIS — E785 Hyperlipidemia, unspecified: Secondary | ICD-10-CM | POA: Diagnosis not present

## 2021-01-22 DIAGNOSIS — M4854XD Collapsed vertebra, not elsewhere classified, thoracic region, subsequent encounter for fracture with routine healing: Secondary | ICD-10-CM | POA: Diagnosis not present

## 2021-01-22 DIAGNOSIS — H3530 Unspecified macular degeneration: Secondary | ICD-10-CM | POA: Diagnosis not present

## 2021-01-22 DIAGNOSIS — I340 Nonrheumatic mitral (valve) insufficiency: Secondary | ICD-10-CM | POA: Diagnosis not present

## 2021-01-22 DIAGNOSIS — I129 Hypertensive chronic kidney disease with stage 1 through stage 4 chronic kidney disease, or unspecified chronic kidney disease: Secondary | ICD-10-CM | POA: Diagnosis not present

## 2021-01-22 DIAGNOSIS — K219 Gastro-esophageal reflux disease without esophagitis: Secondary | ICD-10-CM | POA: Diagnosis not present

## 2021-01-23 ENCOUNTER — Ambulatory Visit: Payer: PPO | Admitting: Medical

## 2021-01-23 ENCOUNTER — Telehealth: Payer: Self-pay | Admitting: Medical

## 2021-01-23 NOTE — Progress Notes (Deleted)
Cardiology Office Note:    Date:  01/23/2021   ID:  [name], DOB 1932-10-30, MRN [id number]  PCP:  [name], MD  [facility] Cardiologist:  None  [facility] Electrophysiologist:  None   Referring MD: [name], MD   Chief Complaint: Hospital follow-up  History of Present Illness:    Jack [name] is a 87 y.o. male with a hx of hypertension, hyperlipidemia, GERD, CKD stage III, BPH, renal stones who is being seen for hospital follow-up.  With was recently admitted for renal stones status post intervention.  He was noted to have possible A. fib and seen by cardiology.  Was felt arrhythmia was PACs and MAT.  Echo showed LVEF 50 to 16%, grade 1 diastolic dysfunction, mild LVH mild AI, moderate dilation of the ascending aorta.  At discharge heart monitor was ordered which showed sinus rhythm, rare PACs and PVCs, 165 atrial runs, no sustained arrhythmia or prolonged pause.  Triggers were corresponding to sinus rhythm.  Today    Past Medical History:  Diagnosis Date  . Elevated prostate specific antigen (PSA)    has been 7 for a year   . GERD (gastroesophageal reflux disease)   . History of colon polyps 2008   [facility],   . History of kidney stones   . Hyperlipidemia   . Hypertension   . Prostate hypertrophy    diagnosed at age 25 due to hematospermia  . Renal disorder     Past Surgical History:  Procedure Laterality Date  . CATARACT EXTRACTION W/PHACO Left 01/10/2018   Procedure: CATARACT EXTRACTION PHACO AND INTRAOCULAR LENS PLACEMENT (IOC);  Surgeon: Birder Robson, MD;  Location: [facility] ORS;  Service: Ophthalmology;  Laterality: Left;  [name] 00:24.8 AP% 14.9 CDE 3.68 Fluid pack lot # [id number]  . CATARACT EXTRACTION W/PHACO Right 01/25/2018   Procedure: CATARACT EXTRACTION PHACO AND INTRAOCULAR LENS PLACEMENT (IOC);  Surgeon: Birder Robson, MD;  Location: [facility] ORS;  Service: Ophthalmology;  Laterality: Right;  [name] 00:42 AP% 10.8 CDE  4.59 Fluid pack lot # [id number]  . COLON SURGERY    . CYSTOSCOPY W/ URETERAL STENT PLACEMENT Right 10/16/2017   Procedure: right  URETERAL STENT PLACEMENT,cystoscopy bilateral stent removal,rretrograde;  Surgeon: Abbie Sons, MD;  Location: [facility] ORS;  Service: Urology;  Laterality: Right;  . CYSTOSCOPY/URETEROSCOPY/HOLMIUM LASER/STENT PLACEMENT Right 12/16/2020   Procedure: CYSTOSCOPY/URETEROSCOPY/HOLMIUM LASER/STENT PLACEMENT;  Surgeon: Abbie Sons, MD;  Location: [facility] ORS;  Service: Urology;  Laterality: Right;  . EXTRACORPOREAL SHOCK WAVE LITHOTRIPSY Right 12/11/2020   Procedure: EXTRACORPOREAL SHOCK WAVE LITHOTRIPSY (ESWL);  Surgeon: Abbie Sons, MD;  Location: [facility] ORS;  Service: Urology;  Laterality: Right;  . KIDNEY STONE SURGERY    . RESECTION SOFT TISSUE TUMOR LEG / ANKLE RADICAL  jan 2009   [facility],  right thigh/knee , nonmalignant  . SMALL INTESTINE SURGERY  1946   implaed on picket fence, punctured stomach  . TONSILLECTOMY      Current Medications: Current Meds  Medication Sig  . losartan (COZAAR) 50 MG tablet TAKE ONE TABLET BY MOUTH     Allergies:   Amlodipine, Azithromycin, and Lisinopril   Social History   Socioeconomic History  . Marital status: Married    Spouse name: [name]  . Number of children: 4  . Years of education: 64  . Highest education level: Not on file  Occupational History  . 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  Smoking status: Former Smoker    Quit date: 07/05/1965    Years since quitting: 55.5  . Smokeless tobacco: Never Used  Vaping Use  . Vaping Use: Never used  Substance and Sexual Activity  . Alcohol use: Yes    Alcohol/week: 1.0 standard drink    Types: 1 Standard drinks or equivalent per week    Comment: occassionaly  . Drug use: No  . Sexual activity: Yes    Birth control/protection: None  Other Topics Concern  . Not on file  Social History Narrative  . Not on file    Social Determinants of Health   Financial Resource Strain: Not on file  Food Insecurity: Not on file  Transportation Needs: Not on file  Physical Activity: Not on file  Stress: Not on file  Social Connections: Not on file     Family History: The patient's ***family history includes Cancer in his sister; Hyperlipidemia in his father; Hypertension in his father.  ROS:   Please see the history of present illness.    *** All other systems reviewed and are negative.  EKGs/Labs/Other Studies Reviewed:    The following studies were reviewed today:  Echo 01/06/21  1. Left ventricular ejection fraction, by estimation, is 50 to 55%. The  left ventricle has low normal function. The left ventricle has no regional  wall motion abnormalities. There is mild left ventricular hypertrophy.  Left ventricular diastolic  parameters are consistent with Grade I diastolic dysfunction (impaired  relaxation).  2. Right ventricular systolic function is normal. The right ventricular  size is normal.  3. The mitral valve is normal in structure. No evidence of mitral valve  regurgitation.  4. The aortic valve is tricuspid. Aortic valve regurgitation is mild.  5. Aortic dilatation noted. There is moderate dilatation of the ascending  aorta, measuring 48 mm. There is mild dilatation of the aortic root,  measuring 40 mm.  6. The inferior vena cava is normal in size with greater than 50%  respiratory variability, suggesting right atrial pressure of 3 mmHg.  Heart monitor 01/21/21   The patient was monitored for 4 days, 3 hours.  The predominant rhythm was sinus with an average rate of 86 bpm (range 56-128 bpm and sinus).  There were rare PACs and PVCs.  165 atrial runs were observed, lasting up to 16.8 seconds with a maximum rate of 193 bpm.  No sustained arrhythmia or prolonged pause was observed.  Patient triggered event corresponds to sinus rhythm.   Predominately sinus rhythm with  rare PACs and PVCs, as well as multiple episodes of PSVT lasting up to 17 seconds.    EKG:  EKG is *** ordered today.  The ekg ordered today demonstrates ***  Recent Labs: 01/02/2021: ALT 11 01/07/2021: BUN 32; Creatinine, Ser 1.97; Hemoglobin 10.4; Platelets 171; Potassium 3.6; Sodium 135  Recent Lipid Panel    Component Value Date/Time   CHOL 225 (H) 08/23/2013 0819   TRIG 223.0 (H) 08/23/2013 0819   HDL 43.10 08/23/2013 0819   CHOLHDL 5 08/23/2013 0819   VLDL 44.6 (H) 08/23/2013 0819   LDLCALC 117 (H) 02/17/2009 1101   LDLDIRECT 143.8 08/23/2013 0819     Risk Assessment/Calculations:   {Does this patient have ATRIAL FIBRILLATION?:[id number]}   Physical Exam:    VS:  There were no vitals taken for this visit.    Wt Readings from Last 3 Encounters:  01/02/21 195 lb (88.5 kg)  12/25/20 200 lb 9.6 oz (91 kg)  12/16/20 206  lb (93.4 kg)     GEN: *** Well nourished, well developed in no acute distress HEENT: Normal NECK: No JVD; No carotid bruits LYMPHATICS: No lymphadenopathy CARDIAC: ***RRR, no murmurs, rubs, gallops RESPIRATORY:  Clear to auscultation without rales, wheezing or rhonchi  ABDOMEN: Soft, non-tender, non-distended MUSCULOSKELETAL:  No edema; No deformity  SKIN: Warm and dry NEUROLOGIC:  Alert and oriented x 3 PSYCHIATRIC:  Normal affect   ASSESSMENT:    1. Palpitations   2. Aortic root dilation (HCC)   3. Paroxysmal SVT (supraventricular tachycardia) (HCC)    PLAN:    In order of problems listed above:  Paroxysmal SVT  Moderate to severely dilated ascending aorta measuring 4.8 cm   Disposition: Follow up {follow up:15908} with ***   Shared Decision Making/Informed Consent   {Are you ordering a CV Procedure (e.g. stress test, cath, DCCV, TEE, etc)?   Press F2        :[id number]}    Signed, Malay Fantroy Arlyss Repress  01/23/2021 7:54 AM    [facility]

## 2021-01-23 NOTE — Telephone Encounter (Signed)
Pt c/o medication issue:  1. Name of Medication: Lipitor  2. How are you currently taking this medication (dosage and times per day)? 40 MG PO Q D   3. Are you having a reaction (difficulty breathing--STAT)? NO  4. What is your medication issue? Patient states previously told not needed then needed by 2 different providers .  Please call to discuss if needed .

## 2021-01-23 NOTE — Telephone Encounter (Signed)
DPR on file. lmom We have only seen the patient is hospital and his first office visit is scheduled for our office on 02/05/21 @ 3:45pm. We did not prescribe the  Atorvastatin. When he was discharged from the Hospital on 01/07/21 it was recommended that he continue Atorvastatin 40 mg daily. He should continue taking. Patient can discuss further at his upcoming appt. Patient should contact the office in the interim if any questions.

## 2021-01-26 DIAGNOSIS — I340 Nonrheumatic mitral (valve) insufficiency: Secondary | ICD-10-CM | POA: Diagnosis not present

## 2021-01-26 DIAGNOSIS — N1832 Chronic kidney disease, stage 3b: Secondary | ICD-10-CM | POA: Diagnosis not present

## 2021-01-26 DIAGNOSIS — M4854XD Collapsed vertebra, not elsewhere classified, thoracic region, subsequent encounter for fracture with routine healing: Secondary | ICD-10-CM | POA: Diagnosis not present

## 2021-01-26 DIAGNOSIS — M545 Low back pain, unspecified: Secondary | ICD-10-CM | POA: Diagnosis not present

## 2021-01-26 DIAGNOSIS — D631 Anemia in chronic kidney disease: Secondary | ICD-10-CM | POA: Diagnosis not present

## 2021-01-26 DIAGNOSIS — I129 Hypertensive chronic kidney disease with stage 1 through stage 4 chronic kidney disease, or unspecified chronic kidney disease: Secondary | ICD-10-CM | POA: Diagnosis not present

## 2021-01-26 DIAGNOSIS — I4891 Unspecified atrial fibrillation: Secondary | ICD-10-CM | POA: Diagnosis not present

## 2021-01-26 DIAGNOSIS — N201 Calculus of ureter: Secondary | ICD-10-CM | POA: Diagnosis not present

## 2021-01-26 MED FILL — TRAMADOL HCL 50 MG TABLET: 7 days supply | Qty: 30 | Fill #0

## 2021-01-26 MED FILL — BACLOFEN 10 MG TABLET: 10 days supply | Qty: 30 | Fill #0

## 2021-01-27 ENCOUNTER — Telehealth: Payer: Self-pay | Admitting: *Deleted

## 2021-01-27 NOTE — Telephone Encounter (Signed)
Left voicemail message to call back for review of monitor results.  

## 2021-01-27 NOTE — Telephone Encounter (Signed)
-----   Message from Arvil Chaco, PA-C sent at 01/22/2021  2:36 PM EDT ----- Monitor was placed before discharge for possible atrial fibrillation and showed  --Normal sinus rhythm of a normal heart rhythm with average rate 86 bpm and range 56 to 128 bpm. --Rare extra beats from the top and bottom part of the heart. --165 runs of a faster rhythm from the top part of the heart, as long as 16.8 seconds and as fast as 192 bpm. --No sustained arrhythmia or prolonged pauses. --Triggered events corresponded to a normal heart rhythm.  Paroxysmal SVT was noted on monitor - no clear evidence of atrial fibrillation. When the patient sees [name], NP, we can reassess symptoms, given the frequency of atrial runs --if very symptomatic, the patient may want to consider referral to EP.  Will Cc Cadence on this note as FYI.

## 2021-01-28 DIAGNOSIS — D631 Anemia in chronic kidney disease: Secondary | ICD-10-CM | POA: Diagnosis not present

## 2021-01-28 DIAGNOSIS — M545 Low back pain, unspecified: Secondary | ICD-10-CM | POA: Diagnosis not present

## 2021-01-28 DIAGNOSIS — I4891 Unspecified atrial fibrillation: Secondary | ICD-10-CM | POA: Diagnosis not present

## 2021-01-28 DIAGNOSIS — N400 Enlarged prostate without lower urinary tract symptoms: Secondary | ICD-10-CM | POA: Diagnosis not present

## 2021-01-28 DIAGNOSIS — S22080S Wedge compression fracture of T11-T12 vertebra, sequela: Secondary | ICD-10-CM | POA: Diagnosis not present

## 2021-01-28 DIAGNOSIS — Z87891 Personal history of nicotine dependence: Secondary | ICD-10-CM | POA: Diagnosis not present

## 2021-01-28 DIAGNOSIS — I340 Nonrheumatic mitral (valve) insufficiency: Secondary | ICD-10-CM | POA: Diagnosis not present

## 2021-01-28 DIAGNOSIS — M4854XD Collapsed vertebra, not elsewhere classified, thoracic region, subsequent encounter for fracture with routine healing: Secondary | ICD-10-CM | POA: Diagnosis not present

## 2021-01-28 DIAGNOSIS — K5900 Constipation, unspecified: Secondary | ICD-10-CM | POA: Diagnosis not present

## 2021-01-28 DIAGNOSIS — N201 Calculus of ureter: Secondary | ICD-10-CM | POA: Diagnosis not present

## 2021-01-28 DIAGNOSIS — E785 Hyperlipidemia, unspecified: Secondary | ICD-10-CM | POA: Diagnosis not present

## 2021-01-28 DIAGNOSIS — H3530 Unspecified macular degeneration: Secondary | ICD-10-CM | POA: Diagnosis not present

## 2021-01-28 DIAGNOSIS — N1832 Chronic kidney disease, stage 3b: Secondary | ICD-10-CM | POA: Diagnosis not present

## 2021-01-28 DIAGNOSIS — I129 Hypertensive chronic kidney disease with stage 1 through stage 4 chronic kidney disease, or unspecified chronic kidney disease: Secondary | ICD-10-CM | POA: Diagnosis not present

## 2021-01-28 DIAGNOSIS — K219 Gastro-esophageal reflux disease without esophagitis: Secondary | ICD-10-CM | POA: Diagnosis not present

## 2021-01-28 DIAGNOSIS — I700 Atherosclerosis of aorta: Secondary | ICD-10-CM | POA: Diagnosis not present

## 2021-01-28 DIAGNOSIS — Z86010 Personal history of colonic polyps: Secondary | ICD-10-CM | POA: Diagnosis not present

## 2021-01-28 DIAGNOSIS — R112 Nausea with vomiting, unspecified: Secondary | ICD-10-CM | POA: Diagnosis not present

## 2021-01-28 NOTE — Telephone Encounter (Signed)
Was able to return call to pt's wife Jack [name] (DPR approved). She had called in regarding pts recent Zio results, advised that Jack Quarry, PA-C had a chance to review Jack [name] results and advised   " Monitor was placed before discharge for possible atrial fibrillation and showed  --Normal sinus rhythm of a normal heart rhythm with average rate 86 bpm and range 56 to 128 bpm. --Rare extra beats from the top and bottom part of the heart. --165 runs of a faster rhythm from the top part of the heart, as long as 16.8 seconds and as fast as 192 bpm. --No sustained arrhythmia or prolonged pauses. --Triggered events corresponded to a normal heart rhythm.  Paroxysmal SVT was noted on monitor - no clear evidence of atrial fibrillation. When the patient sees Jack Furth, NP, we can reassess symptoms, given the frequency of atrial runs --if very symptomatic, the patient may want to consider referral to EP.   Jack [name] is grateful for the return call and explanation, general questions and concerns address, advised on no med changes at this time, continue current medications, pt and wife will ask further questions for clarification at upcoming visit with Jack, pt and wife are currently at another doctor appt and the MD walked in.  Both will be at f/u appt on 5/26.

## 2021-01-28 NOTE — Telephone Encounter (Signed)
Wife calling in for monitor results

## 2021-01-29 ENCOUNTER — Inpatient Hospital Stay (HOSPITAL_COMMUNITY)
Admission: EM | Admit: 2021-01-29 | Discharge: 2021-02-07 | DRG: 515 | Disposition: A | Discharge status: Skilled Nursing Facility | Payer: PPO | Attending: Internal Medicine | Admitting: Internal Medicine

## 2021-01-29 ENCOUNTER — Emergency Department (HOSPITAL_COMMUNITY): Payer: PPO

## 2021-01-29 ENCOUNTER — Encounter (HOSPITAL_COMMUNITY): Payer: Self-pay

## 2021-01-29 DIAGNOSIS — M8448XA Pathological fracture, other site, initial encounter for fracture: Secondary | ICD-10-CM | POA: Diagnosis present

## 2021-01-29 DIAGNOSIS — Z8249 Family history of ischemic heart disease and other diseases of the circulatory system: Secondary | ICD-10-CM

## 2021-01-29 DIAGNOSIS — K5903 Drug induced constipation: Secondary | ICD-10-CM | POA: Diagnosis present

## 2021-01-29 DIAGNOSIS — S22080S Wedge compression fracture of T11-T12 vertebra, sequela: Secondary | ICD-10-CM | POA: Diagnosis present

## 2021-01-29 DIAGNOSIS — N179 Acute kidney failure, unspecified: Secondary | ICD-10-CM | POA: Diagnosis present

## 2021-01-29 DIAGNOSIS — K469 Unspecified abdominal hernia without obstruction or gangrene: Secondary | ICD-10-CM | POA: Diagnosis not present

## 2021-01-29 DIAGNOSIS — M4856XA Collapsed vertebra, not elsewhere classified, lumbar region, initial encounter for fracture: Secondary | ICD-10-CM | POA: Diagnosis present

## 2021-01-29 DIAGNOSIS — D649 Anemia, unspecified: Secondary | ICD-10-CM | POA: Diagnosis present

## 2021-01-29 DIAGNOSIS — E43 Unspecified severe protein-calorie malnutrition: Secondary | ICD-10-CM | POA: Diagnosis present

## 2021-01-29 DIAGNOSIS — R0781 Pleurodynia: Secondary | ICD-10-CM | POA: Diagnosis not present

## 2021-01-29 DIAGNOSIS — I700 Atherosclerosis of aorta: Secondary | ICD-10-CM | POA: Diagnosis not present

## 2021-01-29 DIAGNOSIS — Z20822 Contact with and (suspected) exposure to covid-19: Secondary | ICD-10-CM | POA: Diagnosis present

## 2021-01-29 DIAGNOSIS — K449 Diaphragmatic hernia without obstruction or gangrene: Secondary | ICD-10-CM | POA: Diagnosis not present

## 2021-01-29 DIAGNOSIS — R110 Nausea: Secondary | ICD-10-CM | POA: Diagnosis not present

## 2021-01-29 DIAGNOSIS — J9811 Atelectasis: Secondary | ICD-10-CM | POA: Diagnosis not present

## 2021-01-29 DIAGNOSIS — S32010D Wedge compression fracture of first lumbar vertebra, subsequent encounter for fracture with routine healing: Secondary | ICD-10-CM | POA: Diagnosis not present

## 2021-01-29 DIAGNOSIS — N400 Enlarged prostate without lower urinary tract symptoms: Secondary | ICD-10-CM | POA: Diagnosis present

## 2021-01-29 DIAGNOSIS — S22080D Wedge compression fracture of T11-T12 vertebra, subsequent encounter for fracture with routine healing: Secondary | ICD-10-CM | POA: Diagnosis not present

## 2021-01-29 DIAGNOSIS — M4854XA Collapsed vertebra, not elsewhere classified, thoracic region, initial encounter for fracture: Secondary | ICD-10-CM | POA: Diagnosis present

## 2021-01-29 DIAGNOSIS — I2510 Atherosclerotic heart disease of native coronary artery without angina pectoris: Secondary | ICD-10-CM | POA: Diagnosis not present

## 2021-01-29 DIAGNOSIS — M549 Dorsalgia, unspecified: Secondary | ICD-10-CM | POA: Diagnosis present

## 2021-01-29 DIAGNOSIS — R140 Abdominal distension (gaseous): Secondary | ICD-10-CM | POA: Diagnosis not present

## 2021-01-29 DIAGNOSIS — G629 Polyneuropathy, unspecified: Secondary | ICD-10-CM | POA: Diagnosis not present

## 2021-01-29 DIAGNOSIS — M5134 Other intervertebral disc degeneration, thoracic region: Secondary | ICD-10-CM | POA: Diagnosis present

## 2021-01-29 DIAGNOSIS — E876 Hypokalemia: Secondary | ICD-10-CM | POA: Diagnosis present

## 2021-01-29 DIAGNOSIS — Z87891 Personal history of nicotine dependence: Secondary | ICD-10-CM | POA: Diagnosis not present

## 2021-01-29 DIAGNOSIS — I129 Hypertensive chronic kidney disease with stage 1 through stage 4 chronic kidney disease, or unspecified chronic kidney disease: Secondary | ICD-10-CM | POA: Diagnosis present

## 2021-01-29 DIAGNOSIS — G8929 Other chronic pain: Secondary | ICD-10-CM | POA: Diagnosis present

## 2021-01-29 DIAGNOSIS — S22080A Wedge compression fracture of T11-T12 vertebra, initial encounter for closed fracture: Secondary | ICD-10-CM | POA: Diagnosis present

## 2021-01-29 DIAGNOSIS — Z888 Allergy status to other drugs, medicaments and biological substances status: Secondary | ICD-10-CM

## 2021-01-29 DIAGNOSIS — I4891 Unspecified atrial fibrillation: Secondary | ICD-10-CM | POA: Diagnosis present

## 2021-01-29 DIAGNOSIS — D631 Anemia in chronic kidney disease: Secondary | ICD-10-CM | POA: Diagnosis present

## 2021-01-29 DIAGNOSIS — E785 Hyperlipidemia, unspecified: Secondary | ICD-10-CM | POA: Diagnosis present

## 2021-01-29 DIAGNOSIS — Z6826 Body mass index (BMI) 26.0-26.9, adult: Secondary | ICD-10-CM

## 2021-01-29 DIAGNOSIS — E559 Vitamin D deficiency, unspecified: Secondary | ICD-10-CM | POA: Diagnosis not present

## 2021-01-29 DIAGNOSIS — M5441 Lumbago with sciatica, right side: Secondary | ICD-10-CM

## 2021-01-29 DIAGNOSIS — Z79899 Other long term (current) drug therapy: Secondary | ICD-10-CM | POA: Diagnosis not present

## 2021-01-29 DIAGNOSIS — S32010A Wedge compression fracture of first lumbar vertebra, initial encounter for closed fracture: Secondary | ICD-10-CM | POA: Diagnosis not present

## 2021-01-29 DIAGNOSIS — Z83438 Family history of other disorder of lipoprotein metabolism and other lipidemia: Secondary | ICD-10-CM

## 2021-01-29 DIAGNOSIS — T402X5A Adverse effect of other opioids, initial encounter: Secondary | ICD-10-CM | POA: Diagnosis present

## 2021-01-29 DIAGNOSIS — I480 Paroxysmal atrial fibrillation: Secondary | ICD-10-CM | POA: Diagnosis present

## 2021-01-29 DIAGNOSIS — M6281 Muscle weakness (generalized): Secondary | ICD-10-CM | POA: Diagnosis not present

## 2021-01-29 DIAGNOSIS — M5124 Other intervertebral disc displacement, thoracic region: Secondary | ICD-10-CM | POA: Diagnosis not present

## 2021-01-29 DIAGNOSIS — M47816 Spondylosis without myelopathy or radiculopathy, lumbar region: Secondary | ICD-10-CM | POA: Diagnosis not present

## 2021-01-29 DIAGNOSIS — Z4789 Encounter for other orthopedic aftercare: Secondary | ICD-10-CM | POA: Diagnosis not present

## 2021-01-29 DIAGNOSIS — R52 Pain, unspecified: Secondary | ICD-10-CM

## 2021-01-29 DIAGNOSIS — M546 Pain in thoracic spine: Secondary | ICD-10-CM | POA: Diagnosis not present

## 2021-01-29 DIAGNOSIS — Z9842 Cataract extraction status, left eye: Secondary | ICD-10-CM | POA: Diagnosis not present

## 2021-01-29 DIAGNOSIS — Z808 Family history of malignant neoplasm of other organs or systems: Secondary | ICD-10-CM | POA: Diagnosis not present

## 2021-01-29 DIAGNOSIS — R000 Tachycardia, unspecified: Secondary | ICD-10-CM | POA: Diagnosis not present

## 2021-01-29 DIAGNOSIS — N1832 Chronic kidney disease, stage 3b: Secondary | ICD-10-CM | POA: Diagnosis present

## 2021-01-29 DIAGNOSIS — R0902 Hypoxemia: Secondary | ICD-10-CM | POA: Diagnosis not present

## 2021-01-29 DIAGNOSIS — I469 Cardiac arrest, cause unspecified: Secondary | ICD-10-CM | POA: Diagnosis not present

## 2021-01-29 DIAGNOSIS — S32010S Wedge compression fracture of first lumbar vertebra, sequela: Secondary | ICD-10-CM | POA: Diagnosis present

## 2021-01-29 DIAGNOSIS — R112 Nausea with vomiting, unspecified: Secondary | ICD-10-CM | POA: Diagnosis not present

## 2021-01-29 DIAGNOSIS — K219 Gastro-esophageal reflux disease without esophagitis: Secondary | ICD-10-CM | POA: Diagnosis not present

## 2021-01-29 DIAGNOSIS — I10 Essential (primary) hypertension: Secondary | ICD-10-CM | POA: Diagnosis not present

## 2021-01-29 DIAGNOSIS — M545 Low back pain, unspecified: Secondary | ICD-10-CM | POA: Diagnosis not present

## 2021-01-29 DIAGNOSIS — I499 Cardiac arrhythmia, unspecified: Secondary | ICD-10-CM | POA: Diagnosis not present

## 2021-01-29 DIAGNOSIS — R0689 Other abnormalities of breathing: Secondary | ICD-10-CM | POA: Diagnosis not present

## 2021-01-29 DIAGNOSIS — S2232XA Fracture of one rib, left side, initial encounter for closed fracture: Secondary | ICD-10-CM | POA: Diagnosis not present

## 2021-01-29 LAB — CBC WITH DIFFERENTIAL/PLATELET
Abs Immature Granulocytes: 0.02 10*3/uL (ref 0.00–0.07)
Basophils Absolute: 0 10*3/uL (ref 0.0–0.1)
Basophils Relative: 0 %
Eosinophils Absolute: 0 10*3/uL (ref 0.0–0.5)
Eosinophils Relative: 1 %
HCT: 34.2 % — ABNORMAL LOW (ref 39.0–52.0)
Hemoglobin: 11.1 g/dL — ABNORMAL LOW (ref 13.0–17.0)
Immature Granulocytes: 1 %
Lymphocytes Relative: 19 %
Lymphs Abs: 0.8 10*3/uL (ref 0.7–4.0)
MCH: 30.4 pg (ref 26.0–34.0)
MCHC: 32.5 g/dL (ref 30.0–36.0)
MCV: 93.7 fL (ref 80.0–100.0)
Monocytes Absolute: 0.2 10*3/uL (ref 0.1–1.0)
Monocytes Relative: 6 %
Neutro Abs: 3 10*3/uL (ref 1.7–7.7)
Neutrophils Relative %: 73 %
Platelets: 219 10*3/uL (ref 150–400)
RBC: 3.65 MIL/uL — ABNORMAL LOW (ref 4.22–5.81)
RDW: 14.6 % (ref 11.5–15.5)
WBC: 4.1 10*3/uL (ref 4.0–10.5)
nRBC: 0 % (ref 0.0–0.2)

## 2021-01-29 LAB — URINALYSIS, ROUTINE W REFLEX MICROSCOPIC
Bilirubin Urine: NEGATIVE
Glucose, UA: NEGATIVE mg/dL
Hgb urine dipstick: NEGATIVE
Ketones, ur: NEGATIVE mg/dL
Leukocytes,Ua: NEGATIVE
Nitrite: NEGATIVE
Protein, ur: 100 mg/dL — AB
Specific Gravity, Urine: 1.014 (ref 1.005–1.030)
pH: 8 (ref 5.0–8.0)

## 2021-01-29 LAB — COMPREHENSIVE METABOLIC PANEL
ALT: 11 U/L (ref 0–44)
AST: 17 U/L (ref 15–41)
Albumin: 3.5 g/dL (ref 3.5–5.0)
Alkaline Phosphatase: 104 U/L (ref 38–126)
Anion gap: 10 (ref 5–15)
BUN: 21 mg/dL (ref 8–23)
CO2: 24 mmol/L (ref 22–32)
Calcium: 9.6 mg/dL (ref 8.9–10.3)
Chloride: 104 mmol/L (ref 98–111)
Creatinine, Ser: 1.66 mg/dL — ABNORMAL HIGH (ref 0.61–1.24)
GFR, Estimated: 40 mL/min — ABNORMAL LOW (ref >60)
Glucose, Bld: 106 mg/dL — ABNORMAL HIGH (ref 70–99)
Potassium: 3.4 mmol/L — ABNORMAL LOW (ref 3.5–5.1)
Sodium: 138 mmol/L (ref 135–145)
Total Bilirubin: 0.8 mg/dL (ref 0.3–1.2)
Total Protein: 6.1 g/dL — ABNORMAL LOW (ref 6.5–8.1)

## 2021-01-29 MED ORDER — FENTANYL CITRATE (PF) 100 MCG/2ML IJ SOLN
50.0000 ug | Freq: Once | INTRAMUSCULAR | Status: AC
Start: 2021-01-29 — End: 2021-01-29
  Administered 2021-01-29: 50 ug via INTRAVENOUS
  Filled 2021-01-29: qty 2

## 2021-01-29 MED ORDER — HYDROMORPHONE HCL 1 MG/ML IJ SOLN: 0.5000 mg | INTRAMUSCULAR | Status: DC | PRN

## 2021-01-29 MED ORDER — HYDROMORPHONE HCL 1 MG/ML IJ SOLN: 1.0000 mg | Freq: Once | INTRAMUSCULAR | Status: DC

## 2021-01-29 MED ORDER — HYDRALAZINE HCL 25 MG PO TABS
25.0000 mg | ORAL_TABLET | Freq: Once | ORAL | Status: AC
  Administered 2021-01-29: 25 mg via ORAL
  Filled 2021-01-29: qty 1

## 2021-01-29 MED ORDER — ONDANSETRON HCL 4 MG/2ML IJ SOLN
4.0000 mg | Freq: Once | INTRAMUSCULAR | Status: AC
  Administered 2021-01-29: 4 mg via INTRAVENOUS
  Filled 2021-01-29: qty 2

## 2021-01-29 NOTE — ED Notes (Signed)
Patient transported to CT 

## 2021-01-29 NOTE — ED Provider Notes (Signed)
[place] EMERGENCY DEPARTMENT Provider Note   CSN: [id number] Arrival date & time: 01/29/21  1321     History Chief Complaint  Patient presents with  . Back Pain  . Nausea    Jack [name] is a 87 y.o. male, with a known T12 compression fracture with mild retropulsion, presenting for evaluation of acute on chronic back pain and nausea/vomiting.   When he was initially diagnosed with a T12 compression fracture on CT renal study on 01/02/2021, he was admitted to [facility].  He was seen by neurosurgery and recommended conservative management without neurological findings.  Placed in a TLSO brace.  Recommended referral to Dr. Rudene Christians with orthopedic surgery for consideration for kyphoplasty if he continued to have significant pain.  He says he is actually been dealing with severe back pain for about 2 months, first it was a kidney stone and now this fracture.  He is worried there is something else.  He was seen by his PCP yesterday due to continued pain, ordered repeat x-rays, however they were to be completed today and ended up presenting to the ER due to severe pain. Sharp in nature, overall constant but with periods of increased severity. He states his back pain has been worsening over the past week without new injury or trauma.  He states this is the same pain that he has been dealing with for the past several weeks, just has become more severe.  He has also been nauseous and intermittently vomiting for the past 3 days, in the setting of no bowel movement for the past 5 days.  He has been taking tramadol on a scheduled basis every 4-6 hours  Taking Colace twice daily, picked up MiraLAX yesterday but was scared to take it because it said do not take if vomiting.  Denies any associated abdominal pain.  He recently scheduled an appointment for tomorrow with an orthopedic specialist in [place].  Denies any new extremity numbness, weakness, saddle anesthesia, bowel/bladder  incontinence.  States he took his blood pressure medications prior to arrival today.  States he has had a very difficult time controlling his blood pressure at home and is often very elevated.     Past Medical History:  Diagnosis Date  . Elevated prostate specific antigen (PSA)    has been 7 for a year   . GERD (gastroesophageal reflux disease)   . History of colon polyps 2008   [facility],   . History of kidney stones   . Hyperlipidemia   . Hypertension   . Prostate hypertrophy    diagnosed at age 26 due to hematospermia  . Renal disorder     Patient Active Problem List   Diagnosis Date Noted  . Hypokalemia   . Anemia   . Atherosclerosis of aorta ([place])   . Irregular heart rhythm   . T12 compression fracture ([place]) 01/02/2021  . History of kidney stones   . Acute kidney injury superimposed on CKD ([place])   . Ureteral stone   . Ureteral perforation secondary to stent manipulation 10/16/2017  . Abdominal pain 10/16/2017  . Multinodular goiter 07/03/2014  . Thyroid nodule 06/20/2014  . Edema 09/24/2013  . Encounter for preventive health examination 08/21/2013  . Personal history of colonic polyps 08/21/2013  . History of venomous snake bite 08/21/2013  . Cough 10/02/2012  . Prostate hypertrophy   . Elevated prostate specific antigen (PSA)   . HYPERTRIGLYCERIDEMIA 12/21/2006  . Essential hypertension 12/21/2006  . GERD 12/21/2006  .  CALCULUS, KIDNEY 12/21/2006    Past Surgical History:  Procedure Laterality Date  . CATARACT EXTRACTION W/PHACO Left 01/10/2018   Procedure: CATARACT EXTRACTION PHACO AND INTRAOCULAR LENS PLACEMENT (IOC);  Surgeon: Birder Robson, MD;  Location: [facility] ORS;  Service: Ophthalmology;  Laterality: Left;  [name] 00:24.8 AP% 14.9 CDE 3.68 Fluid pack lot # [id number]  . CATARACT EXTRACTION W/PHACO Right 01/25/2018   Procedure: CATARACT EXTRACTION PHACO AND INTRAOCULAR LENS PLACEMENT (IOC);  Surgeon: Birder Robson, MD;  Location: [facility] ORS;   Service: Ophthalmology;  Laterality: Right;  [name] 00:42 AP% 10.8 CDE 4.59 Fluid pack lot # [id number] H  . COLON SURGERY    . CYSTOSCOPY W/ URETERAL STENT PLACEMENT Right 10/16/2017   Procedure: right  URETERAL STENT PLACEMENT,cystoscopy bilateral stent removal,rretrograde;  Surgeon: Abbie Sons, MD;  Location: [facility] ORS;  Service: Urology;  Laterality: Right;  . CYSTOSCOPY/URETEROSCOPY/HOLMIUM LASER/STENT PLACEMENT Right 12/16/2020   Procedure: CYSTOSCOPY/URETEROSCOPY/HOLMIUM LASER/STENT PLACEMENT;  Surgeon: Abbie Sons, MD;  Location: [facility] ORS;  Service: Urology;  Laterality: Right;  . EXTRACORPOREAL SHOCK WAVE LITHOTRIPSY Right 12/11/2020   Procedure: EXTRACORPOREAL SHOCK WAVE LITHOTRIPSY (ESWL);  Surgeon: Abbie Sons, MD;  Location: [facility] ORS;  Service: Urology;  Laterality: Right;  . KIDNEY STONE SURGERY    . RESECTION SOFT TISSUE TUMOR LEG / ANKLE RADICAL  jan 2009   [facility],  right thigh/knee , nonmalignant  . SMALL INTESTINE SURGERY  1946   implaed on picket fence, punctured stomach  . TONSILLECTOMY         Family History  Problem Relation Age of Onset  . Hypertension Father   . Hyperlipidemia Father   . Cancer Sister        thyroid - dx in late 20's    Social History   Tobacco Use  . Smoking status: Former Smoker    Quit date: 07/05/1965    Years since quitting: 55.6  . Smokeless tobacco: Never Used  Vaping Use  . Vaping Use: Never used  Substance Use Topics  . Alcohol use: Yes    Alcohol/week: 1.0 standard drink    Types: 1 Standard drinks or equivalent per week    Comment: occassionaly  . Drug use: No    Home Medications Prior to Admission medications   Medication Sig Start Date End Date Taking? Authorizing Provider  acetaminophen (TYLENOL) 325 MG tablet Take 2 tablets (650 mg total) by mouth every 6 (six) hours as needed for fever, headache or moderate pain. 01/07/21   Loletha Grayer, MD  Ascorbic Acid (VITAMIN C) 1000 MG tablet Take 1,000 mg by mouth  daily.    [provider]  atorvastatin (LIPITOR) 40 MG tablet Take 1 tablet (40 mg total) by mouth daily at 6 PM. 01/07/21 01/07/22  Loletha Grayer, MD  cholecalciferol (VITAMIN D) 1000 UNITS tablet Take 1,000 Units by mouth daily.    [provider]  Cyanocobalamin (VITAMIN B-12 PO) Take 1 tablet by mouth daily.    [provider]  docusate sodium (COLACE) 100 MG capsule Take 1 capsule (100 mg total) by mouth 2 (two) times daily. 01/07/21   Loletha Grayer, MD  hydrALAZINE (APRESOLINE) 25 MG tablet Take 1 tablet (25 mg total) by mouth every 8 (eight) hours. 01/07/21   Loletha Grayer, MD  losartan (COZAAR) 50 MG tablet TAKE ONE TABLET BY MOUTH 12/26/14   [provider]  magnesium oxide (MAG-OX) 400 MG tablet Take by mouth.    [provider]  Multiple Vitamins-Minerals (PRESERVISION AREDS 2 PO) Take 1  capsule by mouth 2 (two) times daily.     [provider]  oxyCODONE-acetaminophen (PERCOCET/ROXICET) 5-325 MG tablet Take 1 tablet by mouth every 6 (six) hours as needed for severe pain. 01/07/21   Loletha Grayer, MD  polyethylene glycol (MIRALAX / GLYCOLAX) 17 g packet Take 17 g by mouth daily as needed for mild constipation. 01/07/21   Loletha Grayer, MD  tamsulosin (FLOMAX) 0.4 MG CAPS capsule Take 1 capsule (0.4 mg total) by mouth daily. 12/15/20   Zara Council A, PA-C  Zinc 50 MG CAPS Take 50 mg by mouth daily.     [provider]    Allergies    Amlodipine, Azithromycin, and Lisinopril  Review of Systems   Review of Systems  Constitutional: Negative for chills, fatigue and fever.  Respiratory: Negative for shortness of breath.   Cardiovascular: Negative for chest pain.  Gastrointestinal: Positive for constipation, nausea and vomiting. Negative for abdominal distention, abdominal pain and diarrhea.  Genitourinary: Negative for difficulty urinating and dysuria.  Musculoskeletal: Positive for back pain.  Skin: Negative  for rash and wound.  Neurological: Negative for dizziness and light-headedness.  Psychiatric/Behavioral: Negative for behavioral problems and confusion.    Physical Exam Updated Vital Signs BP (!) 196/110 (BP Location: Right Arm)   Pulse 78   Temp 97.9 F (36.6 C) (Oral)   Resp 16   SpO2 99%   Physical Exam Constitutional:      General: He is not in acute distress.    Appearance: Normal appearance. He is not ill-appearing or diaphoretic.  HENT:     Head: Normocephalic and atraumatic.     Mouth/Throat:     Mouth: Mucous membranes are moist.  Eyes:     Extraocular Movements: Extraocular movements intact.  Cardiovascular:     Rate and Rhythm: Normal rate.     Pulses: Normal pulses.  Pulmonary:     Effort: Pulmonary effort is normal.     Breath sounds: Normal breath sounds.  Abdominal:     General: There is no distension.     Palpations: Abdomen is soft.     Tenderness: There is no abdominal tenderness. There is no guarding or rebound.  Musculoskeletal:     Right lower leg: No edema.     Left lower leg: No edema.     Comments: In TLSO brace.  Able to point around lumbar/lower thoracic spinous processes as location of pain that radiates into his left quadratus.  Nontender to palpation to his spinous processes and paraspinal musculature.  Did not test lumbar/thoracic ROM with brace.  No rash or erythema noted.  5/5 lower extremity strength bilaterally.  Sensation to light touch intact throughout.  Skin:    General: Skin is warm and dry.     Capillary Refill: Capillary refill takes less than 2 seconds.  Neurological:     General: No focal deficit present.     Mental Status: He is alert and oriented to person, place, and time.  Psychiatric:        Mood and Affect: Mood normal.        Behavior: Behavior normal.     ED Results / Procedures / Treatments   Labs (all labs ordered are listed, but only abnormal results are displayed) Labs Reviewed - No data to  display  EKG None  Radiology No results found.  Procedures Procedures   Medications Ordered in ED Medications - No data to display  ED Course  I have reviewed the triage vital signs  and the nursing notes.  Pertinent labs & imaging results that were available during my care of the patient were reviewed by me and considered in my medical decision making (see chart for details).    MDM Rules/Calculators/A&P                          87 year old gentleman, with a complex medical history most notable for poorly controlled hypertension, recent renal stone, and T12 compression fracture diagnosed on 4/22, presenting for evaluation of worsening low back pain.  Received fentanyl in transit to the ED with some improvement in pain, sitting comfortably on initial evaluation.  Fortunately with preserved strength and no neurological findings, benign abdomen on exam.  Unfortunately he has been struggling with severe back pain for the past two months in the setting of a now resolved renal stone and T12 compression fracture, do query if additional underlying pathology given persistence and severity of pain despite appropriate conservative management.  Suspect his N/V is multifactorial due to minimal p.o. intake while taking frequent tramadol, severe pain, and constipation.  While his abdomen is soft and nontender, will obtain CT abdomen to evaluate for any contributing intra-abdominal pathology and SBO.  Additionally, will obtain CT thoracic/lumbar spine to further evaluate his severe back pain.  Doubt vascular/aortic dissection given concerns as above, known poorly controlled hypertension at home in the setting of additional pain.  Will also obtain CBC, CMP.   Case signed out to Dr. Sherry Ruffing who will resume care and follow-up CTs. Disposition pending results.  If no acute findings, recommend following up with orthopedic for kyphoplasty given persistent pain and increased bowel regimen for opioid induced  constipation.    Patriciaann Clan, DO 01/29/21 1625    Lucrezia Starch, MD 02/04/21 [phone]

## 2021-01-29 NOTE — ED Triage Notes (Signed)
Pt BIB [facility] from home d/t 7/10 back pain from an old injury & nausea that has persisted for the past 3 days. EMS reports fentanyl 200 mcg given & 500 of NS infused while in route. Upon arrival to ED pt reports pain the same, A/Ox4, verbal-able to make needs known. Does have a back brace on.

## 2021-01-29 NOTE — ED Provider Notes (Signed)
3:25 PM Care assumed from Dr. Roslynn Amble and Dr. Higinio Plan.  At time of transfer of care, patient is awaiting for results of labs, CT imaging, and reassessment to determine etiology of the patient's abdominal pain and back pain.  Patient did have elevated blood pressures but based on the description and previous imaging, the previous team do not suspect an aortic etiology of symptoms.  I suspect he may be taking increased amount of pain medicine due to the back pain leading to some constipation.  Anticipate reassessment to determine if patient is feeling better to see if he will be able to go home if no acute findings are discovered.  6:48 PM Patient was reassessed after his images have returned.  The CT scan shows worsening of his compression fracture and is now 60% compressed at T12.  The CT abdomen pelvis does not show evidence of bowel obstruction but does show a known hiatal hernia.  Other labs similar to prior.  Had a shared decision made conversation with patient and family and they are concerned about his worsening symptoms.  Patient does say he is having worsening pain in his back and he is not having any bowel movements.  He is concerned that it could be from his back injury as opposed to just from the narcotic pain medicine he has been taking.  He reports that he cannot walk and cannot stand at this time due to the pain although he will not admit to any true numbness or weakness.  Due to the patient's uncontrolled pain and the CT report showing progressive compression and some narrowing of the spinal canal, with this new inability to move bowels, we will get MRI to rule out a cauda equina or nerve compression at this time.  Will give patient his home blood pressure medication as his blood pressure has been increasing since he has been here for about 5 and half hours already.  Anticipate reassessment determine disposition.  As patient reports he cannot walk due to his pain and progressive back  injury, patient may end up needing to have PT see him in the morning to determine if he needs to go back to a rehab facility as he reports he only got out of a 2-week stay several days ago and has not done well at home.  10:24 PM Patient's MRI returned showing compression fractures of not only T12 but also L1.  No evidence of other acute injuries.  Some mild retropulsion with no acute associated stenosis.  I spoke to neurosurgery due to this uncontrolled pain and they do request patient be admitted to medicine and they will see the patient in consultation to discuss if surgery is needed and for further pain control.  Medicine team will be called for admission and patient is agreeable to this plan     Zakara Parkey, Gwenyth Allegra, MD 01/30/21 [phone]

## 2021-01-29 NOTE — Progress Notes (Signed)
  NEUROSURGERY PROGRESS NOTE   Recieved call from Dr Sherry Ruffing regarding patient. 87 year old with known T12 compression fracture dx several weeks ago, who presents with worsening back pain and constipation. An MRI T spine was ordered to ensure no causa equina compression and revealed worsening T12 compression fracture and new L1 compression fracture. No significant resultant central canal or foraminal stenosis.   Patient was seen at [facility] when the initial T12 compression fracture was diagnosed.  He was seen by Dr Lacinda Axon with neurosurgery and recommended conservative management with TLSO bracing.  It was recommended referral to Dr. Rudene Christians with orthopedic surgery for consideration for kyphoplasty if he continued to have significant pain.    Would recommend at least touching base with the NS team at [facility] and see if they would like this patient transferred for continued care. Otherwise, can call IR for consideration of kyphoplasty. I do not see any contraindication for vertebral augmentation.   Ferne Reus, PA-C [facility] [employer]

## 2021-01-29 NOTE — ED Notes (Signed)
Got patient on the monitor got patient vitals

## 2021-01-29 NOTE — ED Notes (Signed)
Pt returned from MRI. Pt requesting pain medication. Sent MD a secure chat notifying of request.

## 2021-01-30 ENCOUNTER — Encounter (HOSPITAL_COMMUNITY): Payer: Self-pay | Admitting: Internal Medicine

## 2021-01-30 ENCOUNTER — Other Ambulatory Visit: Payer: Self-pay

## 2021-01-30 DIAGNOSIS — S22080D Wedge compression fracture of T11-T12 vertebra, subsequent encounter for fracture with routine healing: Secondary | ICD-10-CM

## 2021-01-30 DIAGNOSIS — M549 Dorsalgia, unspecified: Secondary | ICD-10-CM | POA: Diagnosis present

## 2021-01-30 DIAGNOSIS — Z6826 Body mass index (BMI) 26.0-26.9, adult: Secondary | ICD-10-CM | POA: Diagnosis not present

## 2021-01-30 DIAGNOSIS — T402X5A Adverse effect of other opioids, initial encounter: Secondary | ICD-10-CM | POA: Diagnosis present

## 2021-01-30 DIAGNOSIS — I129 Hypertensive chronic kidney disease with stage 1 through stage 4 chronic kidney disease, or unspecified chronic kidney disease: Secondary | ICD-10-CM | POA: Diagnosis not present

## 2021-01-30 DIAGNOSIS — I700 Atherosclerosis of aorta: Secondary | ICD-10-CM | POA: Diagnosis not present

## 2021-01-30 DIAGNOSIS — E785 Hyperlipidemia, unspecified: Secondary | ICD-10-CM | POA: Diagnosis not present

## 2021-01-30 DIAGNOSIS — I480 Paroxysmal atrial fibrillation: Secondary | ICD-10-CM

## 2021-01-30 DIAGNOSIS — Z808 Family history of malignant neoplasm of other organs or systems: Secondary | ICD-10-CM | POA: Diagnosis not present

## 2021-01-30 DIAGNOSIS — D631 Anemia in chronic kidney disease: Secondary | ICD-10-CM | POA: Diagnosis not present

## 2021-01-30 DIAGNOSIS — I10 Essential (primary) hypertension: Secondary | ICD-10-CM | POA: Diagnosis not present

## 2021-01-30 DIAGNOSIS — E43 Unspecified severe protein-calorie malnutrition: Secondary | ICD-10-CM | POA: Diagnosis not present

## 2021-01-30 DIAGNOSIS — Z20822 Contact with and (suspected) exposure to covid-19: Secondary | ICD-10-CM | POA: Diagnosis not present

## 2021-01-30 DIAGNOSIS — Z79899 Other long term (current) drug therapy: Secondary | ICD-10-CM | POA: Diagnosis not present

## 2021-01-30 DIAGNOSIS — N179 Acute kidney failure, unspecified: Secondary | ICD-10-CM | POA: Diagnosis not present

## 2021-01-30 DIAGNOSIS — R110 Nausea: Secondary | ICD-10-CM | POA: Diagnosis not present

## 2021-01-30 DIAGNOSIS — Z888 Allergy status to other drugs, medicaments and biological substances status: Secondary | ICD-10-CM | POA: Diagnosis not present

## 2021-01-30 DIAGNOSIS — S2232XA Fracture of one rib, left side, initial encounter for closed fracture: Secondary | ICD-10-CM | POA: Diagnosis not present

## 2021-01-30 DIAGNOSIS — G8929 Other chronic pain: Secondary | ICD-10-CM | POA: Diagnosis not present

## 2021-01-30 DIAGNOSIS — Z87891 Personal history of nicotine dependence: Secondary | ICD-10-CM | POA: Diagnosis not present

## 2021-01-30 DIAGNOSIS — R112 Nausea with vomiting, unspecified: Secondary | ICD-10-CM | POA: Diagnosis not present

## 2021-01-30 DIAGNOSIS — N400 Enlarged prostate without lower urinary tract symptoms: Secondary | ICD-10-CM | POA: Diagnosis present

## 2021-01-30 DIAGNOSIS — E876 Hypokalemia: Secondary | ICD-10-CM | POA: Diagnosis not present

## 2021-01-30 DIAGNOSIS — M47816 Spondylosis without myelopathy or radiculopathy, lumbar region: Secondary | ICD-10-CM | POA: Diagnosis not present

## 2021-01-30 DIAGNOSIS — S32010A Wedge compression fracture of first lumbar vertebra, initial encounter for closed fracture: Secondary | ICD-10-CM | POA: Diagnosis not present

## 2021-01-30 DIAGNOSIS — I2510 Atherosclerotic heart disease of native coronary artery without angina pectoris: Secondary | ICD-10-CM | POA: Diagnosis not present

## 2021-01-30 DIAGNOSIS — K469 Unspecified abdominal hernia without obstruction or gangrene: Secondary | ICD-10-CM | POA: Diagnosis not present

## 2021-01-30 DIAGNOSIS — N1832 Chronic kidney disease, stage 3b: Secondary | ICD-10-CM | POA: Diagnosis not present

## 2021-01-30 DIAGNOSIS — M4856XA Collapsed vertebra, not elsewhere classified, lumbar region, initial encounter for fracture: Secondary | ICD-10-CM | POA: Diagnosis not present

## 2021-01-30 DIAGNOSIS — M5134 Other intervertebral disc degeneration, thoracic region: Secondary | ICD-10-CM | POA: Diagnosis present

## 2021-01-30 DIAGNOSIS — I4891 Unspecified atrial fibrillation: Secondary | ICD-10-CM | POA: Diagnosis present

## 2021-01-30 DIAGNOSIS — J9811 Atelectasis: Secondary | ICD-10-CM | POA: Diagnosis not present

## 2021-01-30 DIAGNOSIS — R0781 Pleurodynia: Secondary | ICD-10-CM | POA: Diagnosis not present

## 2021-01-30 DIAGNOSIS — M4854XA Collapsed vertebra, not elsewhere classified, thoracic region, initial encounter for fracture: Secondary | ICD-10-CM | POA: Diagnosis not present

## 2021-01-30 DIAGNOSIS — K5903 Drug induced constipation: Secondary | ICD-10-CM | POA: Diagnosis present

## 2021-01-30 LAB — CBC
HCT: 34.7 % — ABNORMAL LOW (ref 39.0–52.0)
Hemoglobin: 11.4 g/dL — ABNORMAL LOW (ref 13.0–17.0)
MCH: 30.4 pg (ref 26.0–34.0)
MCHC: 32.9 g/dL (ref 30.0–36.0)
MCV: 92.5 fL (ref 80.0–100.0)
Platelets: 215 10*3/uL (ref 150–400)
RBC: 3.75 MIL/uL — ABNORMAL LOW (ref 4.22–5.81)
RDW: 14.5 % (ref 11.5–15.5)
WBC: 4 10*3/uL (ref 4.0–10.5)
nRBC: 0 % (ref 0.0–0.2)

## 2021-01-30 LAB — BASIC METABOLIC PANEL
Anion gap: 7 (ref 5–15)
BUN: 19 mg/dL (ref 8–23)
CO2: 25 mmol/L (ref 22–32)
Calcium: 9.5 mg/dL (ref 8.9–10.3)
Chloride: 104 mmol/L (ref 98–111)
Creatinine, Ser: 1.58 mg/dL — ABNORMAL HIGH (ref 0.61–1.24)
GFR, Estimated: 43 mL/min — ABNORMAL LOW (ref >60)
Glucose, Bld: 111 mg/dL — ABNORMAL HIGH (ref 70–99)
Potassium: 3.2 mmol/L — ABNORMAL LOW (ref 3.5–5.1)
Sodium: 136 mmol/L (ref 135–145)

## 2021-01-30 LAB — SURGICAL PCR SCREEN
MRSA, PCR: NEGATIVE
Staphylococcus aureus: NEGATIVE

## 2021-01-30 LAB — TSH: TSH: 0.633 u[IU]/mL (ref 0.350–4.500)

## 2021-01-30 MED ORDER — ENOXAPARIN SODIUM 30 MG/0.3ML IJ SOSY: 30.0000 mg | PREFILLED_SYRINGE | INTRAMUSCULAR | Status: DC

## 2021-01-30 MED ORDER — HYDRALAZINE HCL 20 MG/ML IJ SOLN
5.0000 mg | Freq: Four times a day (QID) | INTRAMUSCULAR | Status: DC | PRN
  Administered 2021-01-30 – 2021-02-02 (×2): 5 mg via INTRAVENOUS
  Filled 2021-01-30 (×2): qty 1

## 2021-01-30 MED ORDER — HEPARIN BOLUS VIA INFUSION
4000.0000 [IU] | Freq: Once | INTRAVENOUS | Status: AC
  Administered 2021-01-30: 4000 [IU] via INTRAVENOUS
  Filled 2021-01-30: qty 4000

## 2021-01-30 MED ORDER — POTASSIUM CHLORIDE CRYS ER 20 MEQ PO TBCR
40.0000 meq | EXTENDED_RELEASE_TABLET | Freq: Once | ORAL | Status: AC
  Administered 2021-01-30: 40 meq via ORAL
  Filled 2021-01-30: qty 2

## 2021-01-30 MED ORDER — DIGOXIN 0.25 MG/ML IJ SOLN
0.2500 mg | Freq: Once | INTRAMUSCULAR | Status: DC
  Filled 2021-01-30: qty 1

## 2021-01-30 MED ORDER — POTASSIUM CHLORIDE 10 MEQ/100ML IV SOLN
10.0000 meq | INTRAVENOUS | Status: AC
  Administered 2021-01-30 – 2021-01-31 (×3): 10 meq via INTRAVENOUS
  Filled 2021-01-30 (×3): qty 100

## 2021-01-30 MED ORDER — DILTIAZEM LOAD VIA INFUSION
10.0000 mg | Freq: Once | INTRAVENOUS | Status: AC
  Administered 2021-01-30: 10 mg via INTRAVENOUS
  Filled 2021-01-30: qty 10

## 2021-01-30 MED ORDER — ONDANSETRON HCL 4 MG/2ML IJ SOLN
INTRAMUSCULAR | Status: AC
  Filled 2021-01-30: qty 2

## 2021-01-30 MED ORDER — HEPARIN (PORCINE) 25000 UT/250ML-% IV SOLN
1150.0000 [IU]/h | INTRAVENOUS | Status: DC
  Administered 2021-01-30 – 2021-02-01 (×3): 1150 [IU]/h via INTRAVENOUS
  Filled 2021-01-30 (×3): qty 250

## 2021-01-30 MED ORDER — CEFAZOLIN SODIUM-DEXTROSE 2-4 GM/100ML-% IV SOLN
2.0000 g | Freq: Once | INTRAVENOUS | Status: AC
  Administered 2021-02-02: 2 g via INTRAVENOUS
  Filled 2021-01-30 (×2): qty 100

## 2021-01-30 MED ORDER — TAMSULOSIN HCL 0.4 MG PO CAPS
0.4000 mg | ORAL_CAPSULE | Freq: Every day | ORAL | Status: DC
  Administered 2021-01-30 – 2021-02-07 (×9): 0.4 mg via ORAL
  Filled 2021-01-30 (×9): qty 1

## 2021-01-30 MED ORDER — CEFAZOLIN SODIUM-DEXTROSE 2-4 GM/100ML-% IV SOLN: 2.0000 g | Freq: Once | INTRAVENOUS | Status: DC

## 2021-01-30 MED ORDER — DILTIAZEM LOAD VIA INFUSION: 10.0000 mg | Freq: Once | INTRAVENOUS | Status: DC

## 2021-01-30 MED ORDER — ATORVASTATIN CALCIUM 40 MG PO TABS
40.0000 mg | ORAL_TABLET | Freq: Every day | ORAL | Status: DC
  Administered 2021-01-30 – 2021-02-06 (×8): 40 mg via ORAL
  Filled 2021-01-30 (×8): qty 1

## 2021-01-30 MED ORDER — DOCUSATE SODIUM 100 MG PO CAPS
100.0000 mg | ORAL_CAPSULE | Freq: Two times a day (BID) | ORAL | Status: DC
  Administered 2021-01-30 – 2021-02-04 (×10): 100 mg via ORAL
  Filled 2021-01-30 (×11): qty 1

## 2021-01-30 MED ORDER — CALCIUM CARBONATE-VITAMIN D 500-200 MG-UNIT PO TABS
1.0000 | ORAL_TABLET | Freq: Two times a day (BID) | ORAL | Status: DC
  Administered 2021-01-30 – 2021-02-01 (×5): 1 via ORAL
  Filled 2021-01-30 (×9): qty 1

## 2021-01-30 MED ORDER — ONDANSETRON HCL 4 MG/2ML IJ SOLN
4.0000 mg | Freq: Four times a day (QID) | INTRAMUSCULAR | Status: DC | PRN
  Administered 2021-01-30 – 2021-02-05 (×9): 4 mg via INTRAVENOUS
  Filled 2021-01-30 (×10): qty 2

## 2021-01-30 MED ORDER — BISACODYL 10 MG RE SUPP
10.0000 mg | Freq: Once | RECTAL | Status: AC
  Administered 2021-01-30: 10 mg via RECTAL
  Filled 2021-01-30: qty 1

## 2021-01-30 MED ORDER — POLYETHYLENE GLYCOL 3350 17 G PO PACK
34.0000 g | PACK | Freq: Once | ORAL | Status: AC
  Administered 2021-01-30: 34 g via ORAL
  Filled 2021-01-30: qty 2

## 2021-01-30 MED ORDER — HYDRALAZINE HCL 50 MG PO TABS
50.0000 mg | ORAL_TABLET | Freq: Three times a day (TID) | ORAL | Status: DC
  Administered 2021-01-30: 50 mg via ORAL
  Filled 2021-01-30: qty 1

## 2021-01-30 MED ORDER — MORPHINE SULFATE (PF) 2 MG/ML IV SOLN
2.0000 mg | INTRAVENOUS | Status: DC | PRN
  Administered 2021-01-30 – 2021-02-02 (×10): 2 mg via INTRAVENOUS
  Filled 2021-01-30 (×11): qty 1

## 2021-01-30 MED ORDER — DILTIAZEM HCL-DEXTROSE 125-5 MG/125ML-% IV SOLN (PREMIX)
5.0000 mg/h | INTRAVENOUS | Status: DC
  Administered 2021-01-30: 5 mg/h via INTRAVENOUS
  Filled 2021-01-30: qty 125

## 2021-01-30 MED ORDER — TRAMADOL HCL 50 MG PO TABS
50.0000 mg | ORAL_TABLET | Freq: Four times a day (QID) | ORAL | Status: DC | PRN
  Administered 2021-01-30 – 2021-02-07 (×19): 50 mg via ORAL
  Filled 2021-01-30 (×21): qty 1

## 2021-01-30 MED ORDER — FLEET ENEMA 7-19 GM/118ML RE ENEM
1.0000 | ENEMA | Freq: Once | RECTAL | Status: AC
  Administered 2021-01-30: 1 via RECTAL
  Filled 2021-01-30: qty 1

## 2021-01-30 MED ORDER — MAGNESIUM CITRATE PO SOLN
1.0000 | Freq: Once | ORAL | Status: AC
  Administered 2021-01-30: 1 via ORAL
  Filled 2021-01-30: qty 296

## 2021-01-30 MED ORDER — HYDROCODONE-ACETAMINOPHEN 5-325 MG PO TABS: 1.0000 | ORAL_TABLET | ORAL | Status: DC | PRN

## 2021-01-30 MED ORDER — HYDRALAZINE HCL 25 MG PO TABS
25.0000 mg | ORAL_TABLET | Freq: Three times a day (TID) | ORAL | Status: DC
  Administered 2021-01-30 (×2): 25 mg via ORAL
  Filled 2021-01-30 (×2): qty 1

## 2021-01-30 MED ORDER — ENSURE ENLIVE PO LIQD
237.0000 mL | Freq: Two times a day (BID) | ORAL | Status: DC
  Administered 2021-01-31: 237 mL via ORAL

## 2021-01-30 MED ORDER — MORPHINE SULFATE (PF) 2 MG/ML IV SOLN
1.0000 mg | INTRAVENOUS | Status: DC | PRN
  Administered 2021-01-30 (×2): 1 mg via INTRAVENOUS
  Filled 2021-01-30 (×3): qty 1

## 2021-01-30 MED ORDER — CALCIUM CITRATE-VITAMIN D 500-500 MG-UNIT PO CHEW
2.0000 | CHEWABLE_TABLET | Freq: Two times a day (BID) | ORAL | Status: DC
  Filled 2021-01-30: qty 2

## 2021-01-30 NOTE — Progress Notes (Addendum)
PROGRESS NOTE    Jack [name]  [name]:[id number] DOB: 1932-09-25 DOA: 01/29/2021 PCP: [name], MD  Chief Complaint  Patient presents with  . Back Pain  . Nausea    Brief Narrative:                           This is a no charge note as patient was seen and admitted earlier today by Dr. Hal Hope, patient was seen and examined, lab, imaging and labs were reviewed.  HPI: Jack [name] is a 87 y.o. male with history of hypertension, anemia, chronic kidney disease stage III who was recently admitted at [facility] for T12 compression fracture subsequently discharged to rehab was in rehab for 2 weeks discharged last week from rehab was doing well with the TLSO brace started having worsening pain over the last 3 days mostly in the low back with no radiation or any associated incontinence of urine or bowel.  Denies any fever chills.  Given the worsening pain patient came to the ER.  ED Course: In the ER patient had MRI done which shows T12 and new L1 compression fracture neurosurgery was consulted.  Patient admitted for further pain management.  Labs are largely at baseline.  With hemoglobin around 11.1 creatinine 1.6 COVID test was negative.  Assessment & Plan:   Principal Problem:   T12 compression fracture (HCC)   T12 and new L1 compression fracture for which neurosurgery has been consulted.  Recommendation for kyphoplasty, I have discussed with his neurosurgeon group at [facility], and he wishes to proceed with the procedure here, so interventional radiology were consulted, plan for kyphoplasty. -Pain is poorly controlled, and increased his IV morphine, added tramadol.  Hypertension  -uncontrolled likely from worsening pain.  Patient is on hydralazine we will keep patient on as needed IV hydralazine in addition.  Follow blood pressure trends.  Patient used to be on ARB and hydrochlorothiazide which was recently discontinued due to worsening renal  function. -Blood pressure uncontrolled, increase hydralazine and he is on as needed hydralazine as well  Hyperlipidemia on statins.  Anemia likely from renal disease.  Appears to be baseline.  Hypokalemia -Repleted  CKD stage IIIb -Avoid nephrotoxic medications   Addendum: -Around 3:45 PM patient developed tachycardia, appears to be A. fib with RVR on today monitor, this was confirmed by EKG, started on Cardizem drip, blood pressure on the lower side, discussed with cardiology to evaluate today regarding further recommendations, will start on heparin GTT meanwhile.  Patient with recent evaluation at [facility] regarding arrhythmias, thought to be MAT, where he had event monitor at home as well with no evidence of A. fib then.  DVT prophylaxis: We will start on Lovenox DVT prophylaxis  Code Status: Full Family Communication: none at bedside Disposition:   Status is: Inpatient  Remains inpatient appropriate because:IV treatments appropriate due to intensity of illness or inability to take PO   Dispo: The patient is from: SNF              Anticipated d/c is to: SNF              Patient currently is not medically stable to d/c.   Difficult to place patient No       Consultants:   Interventional radiology   Subjective:  Complaints of back pain, radiating to lower extremities  Objective: Vitals:   01/30/21 0748 01/30/21 1105 01/30/21 1128 01/30/21 1202  BP: (!) 152/84  (!) 174/109 (!) 163/95  Pulse: 94  (!) 103 100  Resp: 13  (!) 23 20  Temp: 97.8 F (36.6 C) 98 F (36.7 C)  98 F (36.7 C)  TempSrc: Oral Oral  Oral  SpO2: 96%  93% 92%  Weight:        Intake/Output Summary (Last 24 hours) at 01/30/2021 1503 Last data filed at 01/30/2021 1245 Gross per 24 hour  Intake 0 ml  Output 350 ml  Net -350 ml   Filed Weights   01/30/21 0529  Weight: 82.9 kg    Examination:  General exam: Appears calm, mildly uncomfortable due to pain. Respiratory  system: Clear to auscultation. Respiratory effort normal. Cardiovascular system: S1 & S2 heard, RRR. No JVD, murmurs, rubs, gallops or clicks. No pedal edema. Gastrointestinal system: Abdomen is nondistended, soft and nontender. No organomegaly or masses felt. Normal bowel sounds heard. Central nervous system: Alert and oriented. No focal neurological deficits. Extremities: Symmetric 5 x 5 power.  Mid back tenderness to palpation Skin: No rashes, lesions or ulcers Psychiatry: Judgement and insight appear normal. Mood & affect appropriate.     Data Reviewed: I have personally reviewed following labs and imaging studies  CBC: Recent Labs  Lab 01/29/21 1425 01/30/21 0211  WBC 4.1 4.0  NEUTROABS 3.0  --   HGB 11.1* 11.4*  HCT 34.2* 34.7*  MCV 93.7 92.5  PLT 219 798    Basic Metabolic Panel: Recent Labs  Lab 01/29/21 1425 01/30/21 0211  NA 138 136  K 3.4* 3.2*  CL 104 104  CO2 24 25  GLUCOSE 106* 111*  BUN 21 19  CREATININE 1.66* 1.58*  CALCIUM 9.6 9.5    GFR: Estimated Creatinine Clearance: 34.2 mL/min (A) (by C-G formula based on SCr of 1.58 mg/dL (H)).  Liver Function Tests: Recent Labs  Lab 01/29/21 1425  AST 17  ALT 11  ALKPHOS 104  BILITOT 0.8  PROT 6.1*  ALBUMIN 3.5    CBG: No results for input(s): GLUCAP in the last 168 hours.   Recent Results (from the past 240 hour(s))  Surgical pcr screen     Status: None   Collection Time: 01/30/21  5:20 AM   Specimen: Nasal Mucosa; Nasal Swab  Result Value Ref Range Status   MRSA, PCR NEGATIVE NEGATIVE Final   Staphylococcus aureus NEGATIVE NEGATIVE Final    Comment: (NOTE) The Xpert SA Assay (FDA approved for NASAL specimens in patients 4 years of age and older), is one component of a comprehensive surveillance program. It is not intended to diagnose infection nor to guide or monitor treatment. Performed at [facility], [place] [address], [place], [place] [address]          Radiology  Studies: CT ABDOMEN PELVIS WO CONTRAST  Result Date: 01/29/2021 CLINICAL DATA:  Acute generalized abdominal pain and distention. EXAM: CT ABDOMEN AND PELVIS WITHOUT CONTRAST TECHNIQUE: Multidetector CT imaging of the abdomen and pelvis was performed following the standard protocol without IV contrast. COMPARISON:  January 02, 2021. FINDINGS: Lower chest: Moderate size sliding-type hiatal hernia is noted. Visualized lung bases are unremarkable. Hepatobiliary: No focal liver abnormality is seen. No gallstones, gallbladder wall thickening, or biliary dilatation. Pancreas: Unremarkable. No pancreatic ductal dilatation or surrounding inflammatory changes. Spleen: Normal in size without focal abnormality. Adrenals/Urinary Tract: Adrenal glands are unremarkable. Kidneys are normal, without renal calculi, focal lesion, or hydronephrosis. Bladder is unremarkable. Stomach/Bowel: Stomach is within normal limits. Appendix appears normal. No  evidence of bowel wall thickening, distention, or inflammatory changes. Vascular/Lymphatic: Aortic atherosclerosis. No enlarged abdominal or pelvic lymph nodes. Reproductive: Stable moderate prostatic enlargement is noted. Other: No abdominal wall hernia or abnormality. No abdominopelvic ascites. Musculoskeletal: Stable subacute to old T12 compression fracture. IMPRESSION: Moderate size sliding-type hiatal hernia. Stable moderate prostatic enlargement. No acute abnormality seen in the abdomen or pelvis. Aortic Atherosclerosis (ICD10-I70.0). Electronically Signed   By: Marijo Conception M.D.   On: 01/29/2021 16:05   CT Thoracic Spine Wo Contrast  Result Date: 01/29/2021 CLINICAL DATA:  Mid back pain; mid back pain, history of T12 fracture, evaluate for additional trauma. Additional provided: Patient reports back pain from old injury, nausea, symptoms persist in for 3 days. EXAM: CT THORACIC SPINE WITHOUT CONTRAST TECHNIQUE: Multidetector CT images of the thoracic were obtained using the  standard protocol without intravenous contrast. COMPARISON:  CT abdomen/pelvis 01/02/2021. FINDINGS: Alignment: No significant spondylolisthesis. Unchanged mild bony retropulsion at the level of the T12 superior endplate. Vertebrae: Mild interval progression of height loss at site of a previously demonstrated T12 compression fracture (now 60%). Vertebral body height is otherwise maintained within the thoracic spine. No evidence of fracture elsewhere within the thoracic spine. T9 vertebral body hemangioma. Multilevel ventrolateral osteophytes. Paraspinal and other soft tissues: Linear atelectasis within the imaged lung bases. Hiatal hernia. Aortoiliac atherosclerosis. Disc levels: Multilevel disc space narrowing. Most notably, there is moderate to moderately advanced disc space narrowing at the T7-T8 through T9-T12 levels. At T11-T12, there is mild bony retropulsion at the level of the T12 superior endplate. Associated disc bulge. Mild relative narrowing of the ventral spinal canal. Mild bilateral neural foraminal narrowing. No appreciable significant disc herniation or spinal canal stenosis at the remaining thoracic levels. No compressive foraminal narrowing. IMPRESSION: Mild interval progression of a previously demonstrated T12 compression fracture (now with 60% height loss). Unchanged mild bony retropulsion at the level of the T12 superior endplate. Associated disc bulge at T11-T12. Resultant mild relative narrowing of the spinal canal at this level. Mild bilateral T11-T12 neural foraminal narrowing. No other thoracic spine fracture is identified. No significant disc herniation, spinal canal stenosis or compressive neural foraminal narrowing at the remaining thoracic levels. Linear atelectasis within the imaged lung bases. Hiatal hernia. Aortic Atherosclerosis (ICD10-I70.0). Electronically Signed   By: Kellie Simmering DO   On: 01/29/2021 16:30   MR THORACIC SPINE WO CONTRAST  Result Date: 01/29/2021 CLINICAL  DATA:  Mid back pain with compression fracture. EXAM: MRI THORACIC AND LUMBAR SPINE WITHOUT CONTRAST TECHNIQUE: Multiplanar and multiecho pulse sequences of the thoracic and lumbar spine were obtained without intravenous contrast. COMPARISON:  CT abdomen pelvis 10/16/2017 CT chest 11/21/2017 CT thoracic and lumbar spine 01/29/2021 FINDINGS: MRI THORACIC SPINE FINDINGS Alignment:  Normal Vertebrae: Compression fracture of T12 with approximately 50% height loss and moderate edema. 5 mm of retropulsion. T9 hemangioma. High fluid sensitive signal within the T8 vertebral body near the inferior endplate, possibly a Schmorl's node. Cord:  Normal signal and morphology. Paraspinal and other soft tissues: Negative. Disc levels: No spinal canal stenosis. MRI LUMBAR SPINE FINDINGS Segmentation:  Standard Alignment:  Normal Vertebrae: High fluid sensitive signal focus in the L4 vertebral body. L1 compression fracture with less than 25% height loss. Minimal retropulsion. Conus medullaris and cauda equina: Conus extends to the L2 level. Conus and cauda equina appear normal. Paraspinal and other soft tissues: Negative. Disc levels: L1-L2: Normal disc space and facet joints. No spinal canal stenosis. No neural foraminal stenosis. L2-L3: Normal disc  space and facet joints. No spinal canal stenosis. No neural foraminal stenosis. L3-L4: Intermediate disc bulge. No spinal canal stenosis. No neural foraminal stenosis. L4-L5: Mild facet hypertrophy with mild left asymmetric disc bulge. No spinal canal stenosis. No neural foraminal stenosis. L5-S1: Normal disc space and facet joints. No spinal canal stenosis. No neural foraminal stenosis. Visualized sacrum: Normal. IMPRESSION: 1. Compression fractures of T12 and L1 with approximately 50% and less than 25% height loss, respectively. Mild retropulsion with no associated stenosis. 2. Indeterminate fluid-sensitive signal hyperintensities in the T8 and L4 vertebrae. I suspect these are  Schmorl's nodes based on comparison with prior CTs. 3. No thoracic or lumbar spinal canal or neural foraminal stenosis. Electronically Signed   By: Ulyses Jarred M.D.   On: 01/29/2021 21:34   MR LUMBAR SPINE WO CONTRAST  Result Date: 01/29/2021 CLINICAL DATA:  Mid back pain with compression fracture. EXAM: MRI THORACIC AND LUMBAR SPINE WITHOUT CONTRAST TECHNIQUE: Multiplanar and multiecho pulse sequences of the thoracic and lumbar spine were obtained without intravenous contrast. COMPARISON:  CT abdomen pelvis 10/16/2017 CT chest 11/21/2017 CT thoracic and lumbar spine 01/29/2021 FINDINGS: MRI THORACIC SPINE FINDINGS Alignment:  Normal Vertebrae: Compression fracture of T12 with approximately 50% height loss and moderate edema. 5 mm of retropulsion. T9 hemangioma. High fluid sensitive signal within the T8 vertebral body near the inferior endplate, possibly a Schmorl's node. Cord:  Normal signal and morphology. Paraspinal and other soft tissues: Negative. Disc levels: No spinal canal stenosis. MRI LUMBAR SPINE FINDINGS Segmentation:  Standard Alignment:  Normal Vertebrae: High fluid sensitive signal focus in the L4 vertebral body. L1 compression fracture with less than 25% height loss. Minimal retropulsion. Conus medullaris and cauda equina: Conus extends to the L2 level. Conus and cauda equina appear normal. Paraspinal and other soft tissues: Negative. Disc levels: L1-L2: Normal disc space and facet joints. No spinal canal stenosis. No neural foraminal stenosis. L2-L3: Normal disc space and facet joints. No spinal canal stenosis. No neural foraminal stenosis. L3-L4: Intermediate disc bulge. No spinal canal stenosis. No neural foraminal stenosis. L4-L5: Mild facet hypertrophy with mild left asymmetric disc bulge. No spinal canal stenosis. No neural foraminal stenosis. L5-S1: Normal disc space and facet joints. No spinal canal stenosis. No neural foraminal stenosis. Visualized sacrum: Normal. IMPRESSION: 1.  Compression fractures of T12 and L1 with approximately 50% and less than 25% height loss, respectively. Mild retropulsion with no associated stenosis. 2. Indeterminate fluid-sensitive signal hyperintensities in the T8 and L4 vertebrae. I suspect these are Schmorl's nodes based on comparison with prior CTs. 3. No thoracic or lumbar spinal canal or neural foraminal stenosis. Electronically Signed   By: Ulyses Jarred M.D.   On: 01/29/2021 21:34   CT L-SPINE NO CHARGE  Result Date: 01/29/2021 CLINICAL DATA:  Back pain. Additional provided: Patient reports back pain from an old injury. Nausea, symptoms persistent for 3 days. EXAM: CT LUMBAR SPINE WITHOUT CONTRAST TECHNIQUE: Multidetector CT imaging of the lumbar spine was performed without intravenous contrast administration. Multiplanar CT image reconstructions were also generated. COMPARISON:  CT abdomen/pelvis 01/02/2021. FINDINGS: Segmentation: 5 lumbar vertebrae. The caudal most well-formed intervertebral disc space is designated L5-S1. Alignment: Mild lumbar dextrocurvature. Unchanged mild bony retropulsion at the level of the T12 superior endplate. Minimal bony retropulsion is present at the L1 superior endplate, at site of an interval compression fracture. Trace L4-L5 grade 1 anterolisthesis, unchanged. Vertebrae: New from the prior CT abdomen/pelvis of 01/02/2021, there is a mild L2-1 vertebral compression fracture (10-20% height loss).  There has also been subtle progressive interval height loss at site of a previously demonstrated T12 vertebral compression fracture (now with 60% height loss anteriorly). Vertebral body height is otherwise maintained. No fracture is identified elsewhere within the lumbar spine. Redemonstrated partial fusion across the right greater than left sacroiliac joints. Paraspinal and other soft tissues: Aortoiliac atherosclerosis. Sigmoid diverticulosis. Paraspinal soft tissues within normal limits. Disc levels: No more than mild disc  degeneration at any level. T11-T12: Unchanged mild bony retropulsion at the level of the T12 superior endplate. Associated disc bulge. Mild relative narrowing of the ventral spinal canal. Mild left neural foraminal narrowing. T12-L1: Trace bony retropulsion at the level of the L1 superior endplate. Small disc bulge. No significant spinal canal stenosis. Mild/moderate bilateral neural foraminal narrowing. L1-L2: Disc bulge. No appreciable significant spinal canal stenosis or neural foraminal narrowing. L2-L3: Disc bulge. No appreciable significant spinal canal stenosis or neural foraminal narrowing. L3-L4: Disc bulge. Facet arthrosis/ligamentum flavum hypertrophy. Mild/moderate central canal stenosis with bilateral subarticular narrowing. Mild bilateral neural foraminal narrowing. L4-L5: Trace grade 1 anterolisthesis. Disc uncovering with disc bulge. Facet arthrosis/ligamentum flavum hypertrophy. Mild/moderate central canal stenosis with bilateral subarticular narrowing. Mild bilateral neural foraminal narrowing. L5-S1: No significant disc herniation or stenosis. IMPRESSION: Mild L1 vertebral compression deformity, new as compared to the CT abdomen/pelvis of 01/02/2021 and acute/subacute. Interval progression of height loss at site of a previously demonstrated T12 compression fracture (now with up to 60% height loss). Unchanged mild bony retropulsion at the level of the T12 superior endplate. Lumbar spondylosis, as outlined. Multilevel spinal canal stenosis. Most notably, there is multifactorial apparent mild/moderate central canal stenosis at L3-L4 and L4-L5 with bilateral subarticular narrowing. Sites of mild and mild/moderate neural foraminal narrowing, as detailed. Partial fusion of the sacroiliac joints, bilaterally. Aortic Atherosclerosis (ICD10-I70.0). Sigmoid diverticulosis. Electronically Signed   By: Kellie Simmering DO   On: 01/29/2021 16:21        Scheduled Meds: . atorvastatin  40 mg Oral q1800  .  calcium-vitamin D  1 tablet Oral BID WC  . docusate sodium  100 mg Oral BID  . feeding supplement  237 mL Oral BID BM  . hydrALAZINE  50 mg Oral Q8H  . tamsulosin  0.4 mg Oral Daily   Continuous Infusions:   LOS: 0 days      Phillips Climes, MD [facility]   To contact the attending provider between 7A-7P or the covering provider during after hours 7P-7A, please log into the web site [email or web address] and access using universal [place] password for that web site. If you do not have the password, please call the hospital operator.  01/30/2021, 3:03 PM

## 2021-01-30 NOTE — H&P (Signed)
History and Physical    Jack [name]:[id number] DOB: May 02, 1933 DOA: 01/29/2021  PCP: [name], MD  Patient coming from: Home.  Chief Complaint: Back pain.  HPI: Jack [name] is a 87 y.o. male with history of hypertension, anemia, chronic kidney disease stage III who was recently admitted at [facility] for T12 compression fracture subsequently discharged to rehab was in rehab for 2 weeks discharged last week from rehab was doing well with the TLSO brace started having worsening pain over the last 3 days mostly in the low back with no radiation or any associated incontinence of urine or bowel.  Denies any fever chills.  Given the worsening pain patient came to the ER.  ED Course: In the ER patient had MRI done which shows T12 and new L1 compression fracture neurosurgery was consulted.  Patient admitted for further pain management.  Labs are largely at baseline.  With hemoglobin around 11.1 creatinine 1.6 COVID test was negative.  Review of Systems: As per HPI, rest all negative.   Past Medical History:  Diagnosis Date  . Elevated prostate specific antigen (PSA)    has been 7 for a year   . GERD (gastroesophageal reflux disease)   . History of colon polyps 2008   [facility],   . History of kidney stones   . Hyperlipidemia   . Hypertension   . Prostate hypertrophy    diagnosed at age 54 due to hematospermia  . Renal disorder     Past Surgical History:  Procedure Laterality Date  . CATARACT EXTRACTION W/PHACO Left 01/10/2018   Procedure: CATARACT EXTRACTION PHACO AND INTRAOCULAR LENS PLACEMENT (IOC);  Surgeon: Birder Robson, MD;  Location: [facility] ORS;  Service: Ophthalmology;  Laterality: Left;  [name] 00:24.8 AP% 14.9 CDE 3.68 Fluid pack lot # [id number]  . CATARACT EXTRACTION W/PHACO Right 01/25/2018   Procedure: CATARACT EXTRACTION PHACO AND INTRAOCULAR LENS PLACEMENT (IOC);  Surgeon: Birder Robson, MD;  Location: [facility] ORS;  Service:  Ophthalmology;  Laterality: Right;  [name] 00:42 AP% 10.8 CDE 4.59 Fluid pack lot # [id number]  . COLON SURGERY    . CYSTOSCOPY W/ URETERAL STENT PLACEMENT Right 10/16/2017   Procedure: right  URETERAL STENT PLACEMENT,cystoscopy bilateral stent removal,rretrograde;  Surgeon: Abbie Sons, MD;  Location: [facility] ORS;  Service: Urology;  Laterality: Right;  . CYSTOSCOPY/URETEROSCOPY/HOLMIUM LASER/STENT PLACEMENT Right 12/16/2020   Procedure: CYSTOSCOPY/URETEROSCOPY/HOLMIUM LASER/STENT PLACEMENT;  Surgeon: Abbie Sons, MD;  Location: [facility] ORS;  Service: Urology;  Laterality: Right;  . EXTRACORPOREAL SHOCK WAVE LITHOTRIPSY Right 12/11/2020   Procedure: EXTRACORPOREAL SHOCK WAVE LITHOTRIPSY (ESWL);  Surgeon: Abbie Sons, MD;  Location: [facility] ORS;  Service: Urology;  Laterality: Right;  . KIDNEY STONE SURGERY    . RESECTION SOFT TISSUE TUMOR LEG / ANKLE RADICAL  jan 2009   [facility],  right thigh/knee , nonmalignant  . SMALL INTESTINE SURGERY  1946   implaed on picket fence, punctured stomach  . TONSILLECTOMY       reports that he quit smoking about 55 years ago. He has never used smokeless tobacco. He reports current alcohol use of about 1.0 standard drink of alcohol per week. He reports that he does not use drugs.  Allergies  Allergen Reactions  . Amlodipine Other (See Comments)    LE edema  . Azithromycin Nausea And Vomiting  . Lisinopril Cough    Family History  Problem Relation Age of Onset  . Hypertension Father   . Hyperlipidemia Father   .  Cancer Sister        thyroid - dx in late 20's    Prior to Admission medications   Medication Sig Start Date End Date Taking? Authorizing Provider  acetaminophen (TYLENOL) 325 MG tablet Take 2 tablets (650 mg total) by mouth every 6 (six) hours as needed for fever, headache or moderate pain. 01/07/21   Loletha Grayer, MD  Ascorbic Acid (VITAMIN C) 1000 MG tablet Take 1,000 mg by mouth daily.    [provider]  atorvastatin (LIPITOR)  40 MG tablet Take 1 tablet (40 mg total) by mouth daily at 6 PM. 01/07/21 01/07/22  Loletha Grayer, MD  cholecalciferol (VITAMIN D) 1000 UNITS tablet Take 1,000 Units by mouth daily.    [provider]  Cyanocobalamin (VITAMIN B-12 PO) Take 1 tablet by mouth daily.    [provider]  docusate sodium (COLACE) 100 MG capsule Take 1 capsule (100 mg total) by mouth 2 (two) times daily. 01/07/21   Loletha Grayer, MD  hydrALAZINE (APRESOLINE) 25 MG tablet Take 1 tablet (25 mg total) by mouth every 8 (eight) hours. 01/07/21   Loletha Grayer, MD  losartan (COZAAR) 50 MG tablet TAKE ONE TABLET BY MOUTH 12/26/14   [provider]  magnesium oxide (MAG-OX) 400 MG tablet Take by mouth.    [provider]  Multiple Vitamins-Minerals (PRESERVISION AREDS 2 PO) Take 1 capsule by mouth 2 (two) times daily.     [provider]  oxyCODONE-acetaminophen (PERCOCET/ROXICET) 5-325 MG tablet Take 1 tablet by mouth every 6 (six) hours as needed for severe pain. 01/07/21   Loletha Grayer, MD  polyethylene glycol (MIRALAX / GLYCOLAX) 17 g packet Take 17 g by mouth daily as needed for mild constipation. 01/07/21   Loletha Grayer, MD  tamsulosin (FLOMAX) 0.4 MG CAPS capsule Take 1 capsule (0.4 mg total) by mouth daily. 12/15/20   Zara Council A, PA-C  Zinc 50 MG CAPS Take 50 mg by mouth daily.     [provider]    Physical Exam: Constitutional: Moderately built and nourished. Vitals:   01/29/21 1930 01/29/21 2115 01/29/21 2145 01/29/21 2230  BP: (!) 175/105 (!) 162/98 (!) 162/128 (!) 177/112  Pulse: 91 85 89 87  Resp: 19 17 20 16   Temp:      TempSrc:      SpO2: 95% 98% 96% 99%   Eyes: Anicteric no pallor. ENMT: No discharge from the ears eyes nose or mouth. Neck: No mass felt.  No neck rigidity. Respiratory: No rhonchi or crepitations. Cardiovascular: S1-S2 heard. Abdomen: Soft nontender bowel sounds present. Musculoskeletal: No edema. Skin: No  rash. Neurologic: Alert awake oriented to time place and person.  Moves all extremities. Psychiatric: Appears normal.  Normal affect.   Labs on Admission: I have personally reviewed following labs and imaging studies  CBC: Recent Labs  Lab 01/29/21 1425  WBC 4.1  NEUTROABS 3.0  HGB 11.1*  HCT 34.2*  MCV 93.7  PLT 102   Basic Metabolic Panel: Recent Labs  Lab 01/29/21 1425  NA 138  K 3.4*  CL 104  CO2 24  GLUCOSE 106*  BUN 21  CREATININE 1.66*  CALCIUM 9.6   GFR: CrCl cannot be calculated (Unknown ideal weight.). Liver Function Tests: Recent Labs  Lab 01/29/21 1425  AST 17  ALT 11  ALKPHOS 104  BILITOT 0.8  PROT 6.1*  ALBUMIN 3.5   No results for input(s): LIPASE, AMYLASE in the last 168 hours. No results for input(s): AMMONIA in  the last 168 hours. Coagulation Profile: No results for input(s): INR, PROTIME in the last 168 hours. Cardiac Enzymes: No results for input(s): CKTOTAL, CKMB, CKMBINDEX, TROPONINI in the last 168 hours. BNP (last 3 results) No results for input(s): PROBNP in the last 8760 hours. HbA1C: No results for input(s): HGBA1C in the last 72 hours. CBG: No results for input(s): GLUCAP in the last 168 hours. Lipid Profile: No results for input(s): CHOL, HDL, LDLCALC, TRIG, CHOLHDL, LDLDIRECT in the last 72 hours. Thyroid Function Tests: No results for input(s): TSH, T4TOTAL, FREET4, T3FREE, THYROIDAB in the last 72 hours. Anemia Panel: No results for input(s): VITAMINB12, FOLATE, FERRITIN, TIBC, IRON, RETICCTPCT in the last 72 hours. Urine analysis:    Component Value Date/Time   COLORURINE YELLOW 01/29/2021 1443   APPEARANCEUR CLOUDY (A) 01/29/2021 1443   APPEARANCEUR Clear 12/25/2020 1037   LABSPEC 1.014 01/29/2021 1443   PHURINE 8.0 01/29/2021 1443   GLUCOSEU NEGATIVE 01/29/2021 1443   HGBUR NEGATIVE 01/29/2021 [place] 01/29/2021 1443   BILIRUBINUR Negative 12/25/2020 1037   KETONESUR NEGATIVE 01/29/2021  1443   PROTEINUR 100 (A) 01/29/2021 1443   NITRITE NEGATIVE 01/29/2021 1443   LEUKOCYTESUR NEGATIVE 01/29/2021 1443   Sepsis Labs: @LABRCNTIP (procalcitonin:4,lacticidven:4) )No results found for this or any previous visit (from the past 240 hour(s)).   Radiological Exams on Admission: CT ABDOMEN PELVIS WO CONTRAST  Result Date: 01/29/2021 CLINICAL DATA:  Acute generalized abdominal pain and distention. EXAM: CT ABDOMEN AND PELVIS WITHOUT CONTRAST TECHNIQUE: Multidetector CT imaging of the abdomen and pelvis was performed following the standard protocol without IV contrast. COMPARISON:  January 02, 2021. FINDINGS: Lower chest: Moderate size sliding-type hiatal hernia is noted. Visualized lung bases are unremarkable. Hepatobiliary: No focal liver abnormality is seen. No gallstones, gallbladder wall thickening, or biliary dilatation. Pancreas: Unremarkable. No pancreatic ductal dilatation or surrounding inflammatory changes. Spleen: Normal in size without focal abnormality. Adrenals/Urinary Tract: Adrenal glands are unremarkable. Kidneys are normal, without renal calculi, focal lesion, or hydronephrosis. Bladder is unremarkable. Stomach/Bowel: Stomach is within normal limits. Appendix appears normal. No evidence of bowel wall thickening, distention, or inflammatory changes. Vascular/Lymphatic: Aortic atherosclerosis. No enlarged abdominal or pelvic lymph nodes. Reproductive: Stable moderate prostatic enlargement is noted. Other: No abdominal wall hernia or abnormality. No abdominopelvic ascites. Musculoskeletal: Stable subacute to old T12 compression fracture. IMPRESSION: Moderate size sliding-type hiatal hernia. Stable moderate prostatic enlargement. No acute abnormality seen in the abdomen or pelvis. Aortic Atherosclerosis (ICD10-I70.0). Electronically Signed   By: Marijo Conception M.D.   On: 01/29/2021 16:05   CT Thoracic Spine Wo Contrast  Result Date: 01/29/2021 CLINICAL DATA:  Mid back pain; mid back  pain, history of T12 fracture, evaluate for additional trauma. Additional provided: Patient reports back pain from old injury, nausea, symptoms persist in for 3 days. EXAM: CT THORACIC SPINE WITHOUT CONTRAST TECHNIQUE: Multidetector CT images of the thoracic were obtained using the standard protocol without intravenous contrast. COMPARISON:  CT abdomen/pelvis 01/02/2021. FINDINGS: Alignment: No significant spondylolisthesis. Unchanged mild bony retropulsion at the level of the T12 superior endplate. Vertebrae: Mild interval progression of height loss at site of a previously demonstrated T12 compression fracture (now 60%). Vertebral body height is otherwise maintained within the thoracic spine. No evidence of fracture elsewhere within the thoracic spine. T9 vertebral body hemangioma. Multilevel ventrolateral osteophytes. Paraspinal and other soft tissues: Linear atelectasis within the imaged lung bases. Hiatal hernia. Aortoiliac atherosclerosis. Disc levels: Multilevel disc space narrowing. Most notably, there is moderate to moderately  advanced disc space narrowing at the T7-T8 through T9-T12 levels. At T11-T12, there is mild bony retropulsion at the level of the T12 superior endplate. Associated disc bulge. Mild relative narrowing of the ventral spinal canal. Mild bilateral neural foraminal narrowing. No appreciable significant disc herniation or spinal canal stenosis at the remaining thoracic levels. No compressive foraminal narrowing. IMPRESSION: Mild interval progression of a previously demonstrated T12 compression fracture (now with 60% height loss). Unchanged mild bony retropulsion at the level of the T12 superior endplate. Associated disc bulge at T11-T12. Resultant mild relative narrowing of the spinal canal at this level. Mild bilateral T11-T12 neural foraminal narrowing. No other thoracic spine fracture is identified. No significant disc herniation, spinal canal stenosis or compressive neural foraminal  narrowing at the remaining thoracic levels. Linear atelectasis within the imaged lung bases. Hiatal hernia. Aortic Atherosclerosis (ICD10-I70.0). Electronically Signed   By: Kellie Simmering DO   On: 01/29/2021 16:30   MR THORACIC SPINE WO CONTRAST  Result Date: 01/29/2021 CLINICAL DATA:  Mid back pain with compression fracture. EXAM: MRI THORACIC AND LUMBAR SPINE WITHOUT CONTRAST TECHNIQUE: Multiplanar and multiecho pulse sequences of the thoracic and lumbar spine were obtained without intravenous contrast. COMPARISON:  CT abdomen pelvis 10/16/2017 CT chest 11/21/2017 CT thoracic and lumbar spine 01/29/2021 FINDINGS: MRI THORACIC SPINE FINDINGS Alignment:  Normal Vertebrae: Compression fracture of T12 with approximately 50% height loss and moderate edema. 5 mm of retropulsion. T9 hemangioma. High fluid sensitive signal within the T8 vertebral body near the inferior endplate, possibly a Schmorl's node. Cord:  Normal signal and morphology. Paraspinal and other soft tissues: Negative. Disc levels: No spinal canal stenosis. MRI LUMBAR SPINE FINDINGS Segmentation:  Standard Alignment:  Normal Vertebrae: High fluid sensitive signal focus in the L4 vertebral body. L1 compression fracture with less than 25% height loss. Minimal retropulsion. Conus medullaris and cauda equina: Conus extends to the L2 level. Conus and cauda equina appear normal. Paraspinal and other soft tissues: Negative. Disc levels: L1-L2: Normal disc space and facet joints. No spinal canal stenosis. No neural foraminal stenosis. L2-L3: Normal disc space and facet joints. No spinal canal stenosis. No neural foraminal stenosis. L3-L4: Intermediate disc bulge. No spinal canal stenosis. No neural foraminal stenosis. L4-L5: Mild facet hypertrophy with mild left asymmetric disc bulge. No spinal canal stenosis. No neural foraminal stenosis. L5-S1: Normal disc space and facet joints. No spinal canal stenosis. No neural foraminal stenosis. Visualized sacrum:  Normal. IMPRESSION: 1. Compression fractures of T12 and L1 with approximately 50% and less than 25% height loss, respectively. Mild retropulsion with no associated stenosis. 2. Indeterminate fluid-sensitive signal hyperintensities in the T8 and L4 vertebrae. I suspect these are Schmorl's nodes based on comparison with prior CTs. 3. No thoracic or lumbar spinal canal or neural foraminal stenosis. Electronically Signed   By: Ulyses Jarred M.D.   On: 01/29/2021 21:34   CT L-SPINE NO CHARGE  Result Date: 01/29/2021 CLINICAL DATA:  Back pain. Additional provided: Patient reports back pain from an old injury. Nausea, symptoms persistent for 3 days. EXAM: CT LUMBAR SPINE WITHOUT CONTRAST TECHNIQUE: Multidetector CT imaging of the lumbar spine was performed without intravenous contrast administration. Multiplanar CT image reconstructions were also generated. COMPARISON:  CT abdomen/pelvis 01/02/2021. FINDINGS: Segmentation: 5 lumbar vertebrae. The caudal most well-formed intervertebral disc space is designated L5-S1. Alignment: Mild lumbar dextrocurvature. Unchanged mild bony retropulsion at the level of the T12 superior endplate. Minimal bony retropulsion is present at the L1 superior endplate, at site of an interval compression  fracture. Trace L4-L5 grade 1 anterolisthesis, unchanged. Vertebrae: New from the prior CT abdomen/pelvis of 01/02/2021, there is a mild L2-1 vertebral compression fracture (10-20% height loss). There has also been subtle progressive interval height loss at site of a previously demonstrated T12 vertebral compression fracture (now with 60% height loss anteriorly). Vertebral body height is otherwise maintained. No fracture is identified elsewhere within the lumbar spine. Redemonstrated partial fusion across the right greater than left sacroiliac joints. Paraspinal and other soft tissues: Aortoiliac atherosclerosis. Sigmoid diverticulosis. Paraspinal soft tissues within normal limits. Disc levels:  No more than mild disc degeneration at any level. T11-T12: Unchanged mild bony retropulsion at the level of the T12 superior endplate. Associated disc bulge. Mild relative narrowing of the ventral spinal canal. Mild left neural foraminal narrowing. T12-L1: Trace bony retropulsion at the level of the L1 superior endplate. Small disc bulge. No significant spinal canal stenosis. Mild/moderate bilateral neural foraminal narrowing. L1-L2: Disc bulge. No appreciable significant spinal canal stenosis or neural foraminal narrowing. L2-L3: Disc bulge. No appreciable significant spinal canal stenosis or neural foraminal narrowing. L3-L4: Disc bulge. Facet arthrosis/ligamentum flavum hypertrophy. Mild/moderate central canal stenosis with bilateral subarticular narrowing. Mild bilateral neural foraminal narrowing. L4-L5: Trace grade 1 anterolisthesis. Disc uncovering with disc bulge. Facet arthrosis/ligamentum flavum hypertrophy. Mild/moderate central canal stenosis with bilateral subarticular narrowing. Mild bilateral neural foraminal narrowing. L5-S1: No significant disc herniation or stenosis. IMPRESSION: Mild L1 vertebral compression deformity, new as compared to the CT abdomen/pelvis of 01/02/2021 and acute/subacute. Interval progression of height loss at site of a previously demonstrated T12 compression fracture (now with up to 60% height loss). Unchanged mild bony retropulsion at the level of the T12 superior endplate. Lumbar spondylosis, as outlined. Multilevel spinal canal stenosis. Most notably, there is multifactorial apparent mild/moderate central canal stenosis at L3-L4 and L4-L5 with bilateral subarticular narrowing. Sites of mild and mild/moderate neural foraminal narrowing, as detailed. Partial fusion of the sacroiliac joints, bilaterally. Aortic Atherosclerosis (ICD10-I70.0). Sigmoid diverticulosis. Electronically Signed   By: Kellie Simmering DO   On: 01/29/2021 16:21     Assessment/Plan Principal Problem:    T12 compression fracture ([place])    1. T12 and new L1 compression fracture for which neurosurgery has been consulted.  We will keep patient on pain medications. 2. Hypertension uncontrolled likely from worsening pain.  Patient is on hydralazine we will keep patient on as needed IV hydralazine in addition.  Follow blood pressure trends.  Patient used to be on ARB and hydrochlorothiazide which was recently discontinued due to worsening renal function. 3. Hyperlipidemia on statins. 4. Anemia likely from renal disease.  Appears to be baseline.   DVT prophylaxis: SCDs.  Avoiding anticoagulation anticipation of possible need for procedure. Code Status: Full code. Family Communication: Discussed with patient. Disposition Plan: Home. Consults called: Neurosurgery. Admission status: Observation.   Rise Patience MD [facility] Pager [phone].  If 7PM-7AM, please contact night-coverage [email or web address] Password [email or web address]  01/30/2021, 12:08 AM

## 2021-01-30 NOTE — Progress Notes (Signed)
Orthopedic Tech Progress Note Patient Details:  Jack [name] [birth year] [id number]  Ortho Devices Type of Ortho Device: Postop shoe/boot Ortho Device/Splint Interventions: Ordered       [name] 01/30/2021, 6:59 PM

## 2021-01-30 NOTE — Progress Notes (Addendum)
ANTICOAGULATION CONSULT NOTE - Initial Consult  Pharmacy Consult for heparin Indication: atrial fibrillation  Allergies  Allergen Reactions  . Amlodipine Other (See Comments)    LE edema  . Azithromycin Nausea And Vomiting  . Lisinopril Cough    Patient Measurements: Height: 5\' 9"  (175.3 cm) Weight: 82.9 kg (182 lb 12.2 oz) IBW/kg (Calculated) : 70.7 Heparin Dosing Weight: 82.9 kg   Vital Signs: Temp: 98.1 F (36.7 C) (05/20 1655) Temp Source: Oral (05/20 1522) BP: 97/70 (05/20 1655) Pulse Rate: 117 (05/20 1655)  Labs: Recent Labs    01/29/21 1425 01/30/21 0211  HGB 11.1* 11.4*  HCT 34.2* 34.7*  PLT 219 215  CREATININE 1.66* 1.58*    Estimated Creatinine Clearance: 34.2 mL/min (A) (by C-G formula based on SCr of 1.58 mg/dL (H)).   Medical History: Past Medical History:  Diagnosis Date  . Elevated prostate specific antigen (PSA)    has been 7 for a year   . GERD (gastroesophageal reflux disease)   . History of colon polyps 2008   [facility],   . History of kidney stones   . Hyperlipidemia   . Hypertension   . Prostate hypertrophy    diagnosed at age 53 due to hematospermia  . Renal disorder     Medications:  Scheduled:  . atorvastatin  40 mg Oral q1800  . calcium-vitamin D  1 tablet Oral BID WC  . digoxin  0.25 mg Intravenous Once  . docusate sodium  100 mg Oral BID  . feeding supplement  237 mL Oral BID BM  . tamsulosin  0.4 mg Oral Daily    Assessment: 87 yom presenting with T12 and new L1 compression fracture, developed new AFib  - no AC PTA. Has plan for kyphoplasty in future.   Hgb 11.4, plt 215. No s/sx of bleeding.   Goal of Therapy:  Heparin level 0.3-0.7 units/ml Monitor platelets by anticoagulation protocol: Yes   Plan:   Start heparin infusion at 1150 units/hr  Order heparin level in 8 hours Monitor daily HL, CBC, and for s/sx of bleeding  F/u timing of kyphoplasty with heparin stop time  Antonietta Jewel, PharmD,  [place] Pharmacist  Phone: [phone] 01/30/2021 5:21 PM  Please check AMION for all [place] phone numbers After 10:00 PM, call [place] [phone]

## 2021-01-30 NOTE — Progress Notes (Signed)
Pt noted to be converted out of a-fib. This RN called tele to verify and find out when it occurred. Shortly after, Dr. Sallyanne Kuster presented to room and managed pt education and further medication orders. At this time pt only had 1 IV access, which was infusing Cardizem. Order had been placed for IV team to place another, for Heparin bolus and drip. Heparin bolus and drip were given at shift change by this RN and Trilby Drummer, RN as second Psychologist, counselling.  Justice Rocher, RN

## 2021-01-30 NOTE — ED Notes (Signed)
Pt given a cup of water, graham crackers, and peanut butter.

## 2021-01-30 NOTE — Progress Notes (Signed)
Initial Nutrition Assessment  DOCUMENTATION CODES:   Severe malnutrition in context of acute illness/injury  INTERVENTION:   - Liberalize diet to Regular to promote PO intake, verbal with readback order placed per MD  - Ensure Enlive po BID, each supplement provides 350 kcal and 20 grams of protein  - Magic Cup BID with meals, each supplement provides 290 kcal and 9 grams of protein  NUTRITION DIAGNOSIS:   Severe Malnutrition related to acute illness (T12 and L1 compression fractures) as evidenced by moderate fat depletion,moderate muscle depletion,percent weight loss (12.7% weight loss in less than 2 months).  GOAL:   Patient will meet greater than or equal to 90% of their needs  MONITOR:   PO intake,Supplement acceptance,Labs,Weight trends  REASON FOR ASSESSMENT:   Malnutrition Screening Tool    ASSESSMENT:   87 year old male who presented to the ED on 5/19 with back pain and nausea x 3 days. PMH of HTN, anemia, CKD stage III, HLD. Pt admitted with known compression fracture of T12 and new L1 compression fracture.   Noted plan for image-guided T12/L1 kyphoplasty/vertebroplasty in IR pending scheduling and insurance authorization.  Spoke with pt at bedside who reports nausea and decreased PO intake over the last 2-3 months. Pt states that his nausea prevents him from eating as frequently and as much as what he would normally eat. Pt also reports weight loss that he believes began about 6 weeks ago.  Pt reports a UBW of 205 lbs. He is unsure what he ways now but believes it is around 185 lbs. Reviewed weight history in chart. Pt with a weight loss of 11.9 kg since 12/08/20. This is a 12.7% weight loss in less than 2 months which is severe and significant for timeframe.  Pt states that so far today he has consumed a Boost and some crackers. Pt states that he just does not have an appetite but that his lunch meal has been ordered. Pt is willing to consume oral nutrition  supplement during admission to aid in meeting kcal and protein needs. RD to order. Discussed diet liberalization with MD who agreed. Verbal with readback order placed for a Regular diet.  Discussed importance of adequate PO intake in promoting healing, preventing additional weight loss, and maintaining lean muscle mass. Pt expresses understanding.  Meal Completion: 0%  Medications reviewed and include: oscal with D, colace, fleet enema  Labs reviewed: potassium 3.2, creatinine 1.58  NUTRITION - FOCUSED PHYSICAL EXAM:  Flowsheet Row Most Recent Value  Orbital Region Moderate depletion  Upper Arm Region Moderate depletion  Thoracic and Lumbar Region Moderate depletion  Buccal Region Moderate depletion  Temple Region Mild depletion  Clavicle Bone Region Moderate depletion  Clavicle and Acromion Bone Region Moderate depletion  Scapular Bone Region Moderate depletion  Dorsal Hand Moderate depletion  Patellar Region Mild depletion  Anterior Thigh Region Moderate depletion  Posterior Calf Region Moderate depletion  Edema (RD Assessment) None  Hair Reviewed  Eyes Reviewed  Mouth Reviewed  Skin Reviewed  Nails Reviewed       Diet Order:   Diet Order            Diet regular Room service appropriate? Yes; Fluid consistency: Thin  Diet effective now                 EDUCATION NEEDS:   Education needs have been addressed  Skin:  Skin Assessment: Reviewed RN Assessment  Last BM:  01/30/21 small type 1  Height:   Ht Readings  from Last 1 Encounters:  01/02/21 5\' 9"  (1.753 m)    Weight:   Wt Readings from Last 1 Encounters:  01/30/21 82.9 kg    BMI:  Body mass index is 26.99 kg/m.  Estimated Nutritional Needs:   Kcal:  1850-2050  Protein:  85-100 grams  Fluid:  1.8-2.0 L    Gustavus Bryant, MS, RD, LDN Inpatient Clinical Dietitian Please see [facility] for contact information.

## 2021-01-30 NOTE — Consult Note (Addendum)
Chief Complaint: Patient was seen in consultation today for T12 and L1 compression fractures/vertebral augmentation.  Referring Physician(s): Elgergawy, [facility])  Supervising Physician: Luanne Bras  Patient Status: [facility] - In-pt  History of Present Illness: Jack [name] is a 87 y.o. male with a past medical history of hypertension, hyperlipidemia, nephrolithiasis, GERD, and BPH. Of note, patient was diagnosed with a T12 compression fracture in late 12/2020. He pursued conservative management for this, including rehab and TLSO brace. His pain worsened, so he presented to [facility] ED 01/29/2021 for further management. In ED, MR thoracic/lumbar spine revealed T12 and L1 compression fractures. He was admitted for pain control and further management. Neurosurgery was consulted who recommended IR consult for possible vertebral augmentation.  MR thoracic/lumbar spine 01/29/2021: 1. Compression fractures of T12 and L1 with approximately 50% and less than 25% height loss, respectively. Mild retropulsion with no associated stenosis. 2. Indeterminate fluid-sensitive signal hyperintensities in the T8 and L4 vertebrae. I suspect these are Schmorl's nodes based on comparison with prior CTs. 3. No thoracic or lumbar spinal canal or neural foraminal stenosis.  IR consulted by Dr. Waldron Labs for possible image-guided T12/L1 kyphoplasty/vertebroplasty. Patient awake and alert laying in bed. Complains of back pain, rated 3/10 without movement and 10/10 with movement. Denies numbness/tingling/pain down bilateral legs. Complains of constipation (secondary to opioid use)- for enema shortly. Denies fever, chills, chest pain, dyspnea, abdominal pain, or headache.   Past Medical History:  Diagnosis Date  . Elevated prostate specific antigen (PSA)    has been 7 for a year   . GERD (gastroesophageal reflux disease)   . History of colon polyps 2008   [facility],   . History of kidney stones   .  Hyperlipidemia   . Hypertension   . Prostate hypertrophy    diagnosed at age 85 due to hematospermia  . Renal disorder     Past Surgical History:  Procedure Laterality Date  . CATARACT EXTRACTION W/PHACO Left 01/10/2018   Procedure: CATARACT EXTRACTION PHACO AND INTRAOCULAR LENS PLACEMENT (IOC);  Surgeon: Birder Robson, MD;  Location: [facility] ORS;  Service: Ophthalmology;  Laterality: Left;  [name] 00:24.8 AP% 14.9 CDE 3.68 Fluid pack lot # [id number]  . CATARACT EXTRACTION W/PHACO Right 01/25/2018   Procedure: CATARACT EXTRACTION PHACO AND INTRAOCULAR LENS PLACEMENT (IOC);  Surgeon: Birder Robson, MD;  Location: [facility] ORS;  Service: Ophthalmology;  Laterality: Right;  [name] 00:42 AP% 10.8 CDE 4.59 Fluid pack lot # [id number]  . COLON SURGERY    . CYSTOSCOPY W/ URETERAL STENT PLACEMENT Right 10/16/2017   Procedure: right  URETERAL STENT PLACEMENT,cystoscopy bilateral stent removal,rretrograde;  Surgeon: Abbie Sons, MD;  Location: [facility] ORS;  Service: Urology;  Laterality: Right;  . CYSTOSCOPY/URETEROSCOPY/HOLMIUM LASER/STENT PLACEMENT Right 12/16/2020   Procedure: CYSTOSCOPY/URETEROSCOPY/HOLMIUM LASER/STENT PLACEMENT;  Surgeon: Abbie Sons, MD;  Location: [facility] ORS;  Service: Urology;  Laterality: Right;  . EXTRACORPOREAL SHOCK WAVE LITHOTRIPSY Right 12/11/2020   Procedure: EXTRACORPOREAL SHOCK WAVE LITHOTRIPSY (ESWL);  Surgeon: Abbie Sons, MD;  Location: [facility] ORS;  Service: Urology;  Laterality: Right;  . KIDNEY STONE SURGERY    . RESECTION SOFT TISSUE TUMOR LEG / ANKLE RADICAL  jan 2009   [facility],  right thigh/knee , nonmalignant  . SMALL INTESTINE SURGERY  1946   implaed on picket fence, punctured stomach  . TONSILLECTOMY      Allergies: Amlodipine, Azithromycin, and Lisinopril  Medications: Prior to Admission medications   Medication Sig Start Date End Date Taking? Authorizing Provider  acetaminophen (TYLENOL) 325 MG tablet Take 2 tablets (650 mg total) by mouth every 6  (six) hours as needed for fever, headache or moderate pain. 01/07/21   Loletha Grayer, MD  Ascorbic Acid (VITAMIN C) 1000 MG tablet Take 1,000 mg by mouth daily.    [provider]  atorvastatin (LIPITOR) 40 MG tablet Take 1 tablet (40 mg total) by mouth daily at 6 PM. 01/07/21 01/07/22  Loletha Grayer, MD  cholecalciferol (VITAMIN D) 1000 UNITS tablet Take 1,000 Units by mouth daily.    [provider]  Cyanocobalamin (VITAMIN B-12 PO) Take 1 tablet by mouth daily.    [provider]  docusate sodium (COLACE) 100 MG capsule Take 1 capsule (100 mg total) by mouth 2 (two) times daily. 01/07/21   Loletha Grayer, MD  hydrALAZINE (APRESOLINE) 25 MG tablet Take 1 tablet (25 mg total) by mouth every 8 (eight) hours. 01/07/21   Loletha Grayer, MD  losartan (COZAAR) 50 MG tablet TAKE ONE TABLET BY MOUTH 12/26/14   [provider]  magnesium oxide (MAG-OX) 400 MG tablet Take by mouth.    [provider]  Multiple Vitamins-Minerals (PRESERVISION AREDS 2 PO) Take 1 capsule by mouth 2 (two) times daily.     [provider]  oxyCODONE-acetaminophen (PERCOCET/ROXICET) 5-325 MG tablet Take 1 tablet by mouth every 6 (six) hours as needed for severe pain. 01/07/21   Loletha Grayer, MD  polyethylene glycol (MIRALAX / GLYCOLAX) 17 g packet Take 17 g by mouth daily as needed for mild constipation. 01/07/21   Loletha Grayer, MD  tamsulosin (FLOMAX) 0.4 MG CAPS capsule Take 1 capsule (0.4 mg total) by mouth daily. 12/15/20   Zara Council A, PA-C  Zinc 50 MG CAPS Take 50 mg by mouth daily.     [provider]     Family History  Problem Relation Age of Onset  . Hypertension Father   . Hyperlipidemia Father   . Cancer Sister        thyroid - dx in late 20's    Social History   Socioeconomic History  . Marital status: Married    Spouse name: [name]  . Number of children: 4  . Years of education: 5  . Highest education level: Not on file   Occupational History  . 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 Smoking status: Former Smoker    Quit date: 07/05/1965    Years since quitting: 55.6  . Smokeless tobacco: Never Used  Vaping Use  . Vaping Use: Never used  Substance and Sexual Activity  . Alcohol use: Yes    Alcohol/week: 1.0 standard drink    Types: 1 Standard drinks or equivalent per week    Comment: occassionaly  . Drug use: No  . Sexual activity: Yes    Birth control/protection: None  Other Topics Concern  . Not on file  Social History Narrative  . Not on file   Social Determinants of Health   Financial Resource Strain: Not on file  Food Insecurity: Not on file  Transportation Needs: Not on file  Physical Activity: Not on file  Stress: Not on file  Social Connections: Not on file     Review of Systems: A 12 point ROS discussed and pertinent positives are indicated in the HPI above.  All other systems are negative.  Review of Systems  Constitutional: Negative for chills and fever.  Respiratory: Negative for shortness of  breath and wheezing.   Cardiovascular: Negative for chest pain and palpitations.  Gastrointestinal: Positive for constipation. Negative for abdominal pain.  Musculoskeletal: Positive for back pain.  Neurological: Negative for numbness.  Psychiatric/Behavioral: Negative for behavioral problems and confusion.    Vital Signs: BP (!) 163/95 (BP Location: Right Arm)   Pulse 100   Temp 98 F (36.7 C) (Oral)   Resp 20   Wt 182 lb 12.2 oz (82.9 kg)   SpO2 92%   BMI 26.99 kg/m   Physical Exam Vitals and nursing note reviewed.  Constitutional:      General: He is not in acute distress. Cardiovascular:     Rate and Rhythm: Regular rhythm. Tachycardia present.     Heart sounds: Normal heart sounds. No murmur heard.   Pulmonary:     Effort: Pulmonary effort is normal. No respiratory distress.     Breath sounds: Normal breath sounds.  No wheezing.  Skin:    General: Skin is warm and dry.  Neurological:     Mental Status: He is alert and oriented to person, place, and time.      MD Evaluation Airway: WNL Heart: WNL Abdomen: WNL Chest/ Lungs: WNL ASA  Classification: 2 Mallampati/Airway Score: Two   Imaging: CT ABDOMEN PELVIS WO CONTRAST  Result Date: 01/29/2021 CLINICAL DATA:  Acute generalized abdominal pain and distention. EXAM: CT ABDOMEN AND PELVIS WITHOUT CONTRAST TECHNIQUE: Multidetector CT imaging of the abdomen and pelvis was performed following the standard protocol without IV contrast. COMPARISON:  January 02, 2021. FINDINGS: Lower chest: Moderate size sliding-type hiatal hernia is noted. Visualized lung bases are unremarkable. Hepatobiliary: No focal liver abnormality is seen. No gallstones, gallbladder wall thickening, or biliary dilatation. Pancreas: Unremarkable. No pancreatic ductal dilatation or surrounding inflammatory changes. Spleen: Normal in size without focal abnormality. Adrenals/Urinary Tract: Adrenal glands are unremarkable. Kidneys are normal, without renal calculi, focal lesion, or hydronephrosis. Bladder is unremarkable. Stomach/Bowel: Stomach is within normal limits. Appendix appears normal. No evidence of bowel wall thickening, distention, or inflammatory changes. Vascular/Lymphatic: Aortic atherosclerosis. No enlarged abdominal or pelvic lymph nodes. Reproductive: Stable moderate prostatic enlargement is noted. Other: No abdominal wall hernia or abnormality. No abdominopelvic ascites. Musculoskeletal: Stable subacute to old T12 compression fracture. IMPRESSION: Moderate size sliding-type hiatal hernia. Stable moderate prostatic enlargement. No acute abnormality seen in the abdomen or pelvis. Aortic Atherosclerosis (ICD10-I70.0). Electronically Signed   By: Marijo Conception M.D.   On: 01/29/2021 16:05   CT Thoracic Spine Wo Contrast  Result Date: 01/29/2021 CLINICAL DATA:  Mid back pain; mid back  pain, history of T12 fracture, evaluate for additional trauma. Additional provided: Patient reports back pain from old injury, nausea, symptoms persist in for 3 days. EXAM: CT THORACIC SPINE WITHOUT CONTRAST TECHNIQUE: Multidetector CT images of the thoracic were obtained using the standard protocol without intravenous contrast. COMPARISON:  CT abdomen/pelvis 01/02/2021. FINDINGS: Alignment: No significant spondylolisthesis. Unchanged mild bony retropulsion at the level of the T12 superior endplate. Vertebrae: Mild interval progression of height loss at site of a previously demonstrated T12 compression fracture (now 60%). Vertebral body height is otherwise maintained within the thoracic spine. No evidence of fracture elsewhere within the thoracic spine. T9 vertebral body hemangioma. Multilevel ventrolateral osteophytes. Paraspinal and other soft tissues: Linear atelectasis within the imaged lung bases. Hiatal hernia. Aortoiliac atherosclerosis. Disc levels: Multilevel disc space narrowing. Most notably, there is moderate to moderately advanced disc space narrowing at the T7-T8 through T9-T12 levels. At T11-T12, there is mild bony retropulsion at  the level of the T12 superior endplate. Associated disc bulge. Mild relative narrowing of the ventral spinal canal. Mild bilateral neural foraminal narrowing. No appreciable significant disc herniation or spinal canal stenosis at the remaining thoracic levels. No compressive foraminal narrowing. IMPRESSION: Mild interval progression of a previously demonstrated T12 compression fracture (now with 60% height loss). Unchanged mild bony retropulsion at the level of the T12 superior endplate. Associated disc bulge at T11-T12. Resultant mild relative narrowing of the spinal canal at this level. Mild bilateral T11-T12 neural foraminal narrowing. No other thoracic spine fracture is identified. No significant disc herniation, spinal canal stenosis or compressive neural foraminal  narrowing at the remaining thoracic levels. Linear atelectasis within the imaged lung bases. Hiatal hernia. Aortic Atherosclerosis (ICD10-I70.0). Electronically Signed   By: Kellie Simmering DO   On: 01/29/2021 16:30   MR THORACIC SPINE WO CONTRAST  Result Date: 01/29/2021 CLINICAL DATA:  Mid back pain with compression fracture. EXAM: MRI THORACIC AND LUMBAR SPINE WITHOUT CONTRAST TECHNIQUE: Multiplanar and multiecho pulse sequences of the thoracic and lumbar spine were obtained without intravenous contrast. COMPARISON:  CT abdomen pelvis 10/16/2017 CT chest 11/21/2017 CT thoracic and lumbar spine 01/29/2021 FINDINGS: MRI THORACIC SPINE FINDINGS Alignment:  Normal Vertebrae: Compression fracture of T12 with approximately 50% height loss and moderate edema. 5 mm of retropulsion. T9 hemangioma. High fluid sensitive signal within the T8 vertebral body near the inferior endplate, possibly a Schmorl's node. Cord:  Normal signal and morphology. Paraspinal and other soft tissues: Negative. Disc levels: No spinal canal stenosis. MRI LUMBAR SPINE FINDINGS Segmentation:  Standard Alignment:  Normal Vertebrae: High fluid sensitive signal focus in the L4 vertebral body. L1 compression fracture with less than 25% height loss. Minimal retropulsion. Conus medullaris and cauda equina: Conus extends to the L2 level. Conus and cauda equina appear normal. Paraspinal and other soft tissues: Negative. Disc levels: L1-L2: Normal disc space and facet joints. No spinal canal stenosis. No neural foraminal stenosis. L2-L3: Normal disc space and facet joints. No spinal canal stenosis. No neural foraminal stenosis. L3-L4: Intermediate disc bulge. No spinal canal stenosis. No neural foraminal stenosis. L4-L5: Mild facet hypertrophy with mild left asymmetric disc bulge. No spinal canal stenosis. No neural foraminal stenosis. L5-S1: Normal disc space and facet joints. No spinal canal stenosis. No neural foraminal stenosis. Visualized sacrum:  Normal. IMPRESSION: 1. Compression fractures of T12 and L1 with approximately 50% and less than 25% height loss, respectively. Mild retropulsion with no associated stenosis. 2. Indeterminate fluid-sensitive signal hyperintensities in the T8 and L4 vertebrae. I suspect these are Schmorl's nodes based on comparison with prior CTs. 3. No thoracic or lumbar spinal canal or neural foraminal stenosis. Electronically Signed   By: Ulyses Jarred M.D.   On: 01/29/2021 21:34   MR LUMBAR SPINE WO CONTRAST  Result Date: 01/29/2021 CLINICAL DATA:  Mid back pain with compression fracture. EXAM: MRI THORACIC AND LUMBAR SPINE WITHOUT CONTRAST TECHNIQUE: Multiplanar and multiecho pulse sequences of the thoracic and lumbar spine were obtained without intravenous contrast. COMPARISON:  CT abdomen pelvis 10/16/2017 CT chest 11/21/2017 CT thoracic and lumbar spine 01/29/2021 FINDINGS: MRI THORACIC SPINE FINDINGS Alignment:  Normal Vertebrae: Compression fracture of T12 with approximately 50% height loss and moderate edema. 5 mm of retropulsion. T9 hemangioma. High fluid sensitive signal within the T8 vertebral body near the inferior endplate, possibly a Schmorl's node. Cord:  Normal signal and morphology. Paraspinal and other soft tissues: Negative. Disc levels: No spinal canal stenosis. MRI LUMBAR SPINE FINDINGS Segmentation:  Standard Alignment:  Normal Vertebrae: High fluid sensitive signal focus in the L4 vertebral body. L1 compression fracture with less than 25% height loss. Minimal retropulsion. Conus medullaris and cauda equina: Conus extends to the L2 level. Conus and cauda equina appear normal. Paraspinal and other soft tissues: Negative. Disc levels: L1-L2: Normal disc space and facet joints. No spinal canal stenosis. No neural foraminal stenosis. L2-L3: Normal disc space and facet joints. No spinal canal stenosis. No neural foraminal stenosis. L3-L4: Intermediate disc bulge. No spinal canal stenosis. No neural foraminal  stenosis. L4-L5: Mild facet hypertrophy with mild left asymmetric disc bulge. No spinal canal stenosis. No neural foraminal stenosis. L5-S1: Normal disc space and facet joints. No spinal canal stenosis. No neural foraminal stenosis. Visualized sacrum: Normal. IMPRESSION: 1. Compression fractures of T12 and L1 with approximately 50% and less than 25% height loss, respectively. Mild retropulsion with no associated stenosis. 2. Indeterminate fluid-sensitive signal hyperintensities in the T8 and L4 vertebrae. I suspect these are Schmorl's nodes based on comparison with prior CTs. 3. No thoracic or lumbar spinal canal or neural foraminal stenosis. Electronically Signed   By: Ulyses Jarred M.D.   On: 01/29/2021 21:34   CT L-SPINE NO CHARGE  Result Date: 01/29/2021 CLINICAL DATA:  Back pain. Additional provided: Patient reports back pain from an old injury. Nausea, symptoms persistent for 3 days. EXAM: CT LUMBAR SPINE WITHOUT CONTRAST TECHNIQUE: Multidetector CT imaging of the lumbar spine was performed without intravenous contrast administration. Multiplanar CT image reconstructions were also generated. COMPARISON:  CT abdomen/pelvis 01/02/2021. FINDINGS: Segmentation: 5 lumbar vertebrae. The caudal most well-formed intervertebral disc space is designated L5-S1. Alignment: Mild lumbar dextrocurvature. Unchanged mild bony retropulsion at the level of the T12 superior endplate. Minimal bony retropulsion is present at the L1 superior endplate, at site of an interval compression fracture. Trace L4-L5 grade 1 anterolisthesis, unchanged. Vertebrae: New from the prior CT abdomen/pelvis of 01/02/2021, there is a mild L2-1 vertebral compression fracture (10-20% height loss). There has also been subtle progressive interval height loss at site of a previously demonstrated T12 vertebral compression fracture (now with 60% height loss anteriorly). Vertebral body height is otherwise maintained. No fracture is identified elsewhere  within the lumbar spine. Redemonstrated partial fusion across the right greater than left sacroiliac joints. Paraspinal and other soft tissues: Aortoiliac atherosclerosis. Sigmoid diverticulosis. Paraspinal soft tissues within normal limits. Disc levels: No more than mild disc degeneration at any level. T11-T12: Unchanged mild bony retropulsion at the level of the T12 superior endplate. Associated disc bulge. Mild relative narrowing of the ventral spinal canal. Mild left neural foraminal narrowing. T12-L1: Trace bony retropulsion at the level of the L1 superior endplate. Small disc bulge. No significant spinal canal stenosis. Mild/moderate bilateral neural foraminal narrowing. L1-L2: Disc bulge. No appreciable significant spinal canal stenosis or neural foraminal narrowing. L2-L3: Disc bulge. No appreciable significant spinal canal stenosis or neural foraminal narrowing. L3-L4: Disc bulge. Facet arthrosis/ligamentum flavum hypertrophy. Mild/moderate central canal stenosis with bilateral subarticular narrowing. Mild bilateral neural foraminal narrowing. L4-L5: Trace grade 1 anterolisthesis. Disc uncovering with disc bulge. Facet arthrosis/ligamentum flavum hypertrophy. Mild/moderate central canal stenosis with bilateral subarticular narrowing. Mild bilateral neural foraminal narrowing. L5-S1: No significant disc herniation or stenosis. IMPRESSION: Mild L1 vertebral compression deformity, new as compared to the CT abdomen/pelvis of 01/02/2021 and acute/subacute. Interval progression of height loss at site of a previously demonstrated T12 compression fracture (now with up to 60% height loss). Unchanged mild bony retropulsion at the level of the T12 superior  endplate. Lumbar spondylosis, as outlined. Multilevel spinal canal stenosis. Most notably, there is multifactorial apparent mild/moderate central canal stenosis at L3-L4 and L4-L5 with bilateral subarticular narrowing. Sites of mild and mild/moderate neural  foraminal narrowing, as detailed. Partial fusion of the sacroiliac joints, bilaterally. Aortic Atherosclerosis (ICD10-I70.0). Sigmoid diverticulosis. Electronically Signed   By: Kellie Simmering DO   On: 01/29/2021 16:21   ECHOCARDIOGRAM COMPLETE  Result Date: 01/07/2021    ECHOCARDIOGRAM REPORT   Patient Name:   DURELLE ZEPEDA [facility] Date of Exam: 01/06/2021 Medical Rec #:  [id number]       Height:       69.0 in Accession #:    [id number]      Weight:       195.0 lb Date of Birth:  Jan 12, 1933      BSA:          2.044 m Patient Age:    55 years        BP:           155/99 mmHg Patient Gender: M               HR:           84 bpm. Exam Location:  [facility] Procedure: 2D Echo, Cardiac Doppler and Color Doppler Indications:     Multifocal atrial tachycardia  History:         Patient has no prior history of Echocardiogram examinations.                  Risk Factors:Hypertension and Dyslipidemia.  Sonographer:     Wilford Sports Rodgers-Jones Referring Phys:  [id number] END Diagnosing Phys: Kate Sable MD IMPRESSIONS  1. Left ventricular ejection fraction, by estimation, is 50 to 55%. The left ventricle has low normal function. The left ventricle has no regional wall motion abnormalities. There is mild left ventricular hypertrophy. Left ventricular diastolic parameters are consistent with Grade I diastolic dysfunction (impaired relaxation).  2. Right ventricular systolic function is normal. The right ventricular size is normal.  3. The mitral valve is normal in structure. No evidence of mitral valve regurgitation.  4. The aortic valve is tricuspid. Aortic valve regurgitation is mild.  5. Aortic dilatation noted. There is moderate dilatation of the ascending aorta, measuring 48 mm. There is mild dilatation of the aortic root, measuring 40 mm.  6. The inferior vena cava is normal in size with greater than 50% respiratory variability, suggesting right atrial pressure of 3 mmHg. FINDINGS  Left Ventricle: Left ventricular ejection  fraction, by estimation, is 50 to 55%. The left ventricle has low normal function. The left ventricle has no regional wall motion abnormalities. The left ventricular internal cavity size was normal in size. There is mild left ventricular hypertrophy. Left ventricular diastolic parameters are consistent with Grade I diastolic dysfunction (impaired relaxation). Right Ventricle: The right ventricular size is normal. No increase in right ventricular wall thickness. Right ventricular systolic function is normal. Left Atrium: Left atrial size was normal in size. Right Atrium: Right atrial size was normal in size. Pericardium: There is no evidence of pericardial effusion. Mitral Valve: The mitral valve is normal in structure. No evidence of mitral valve regurgitation. Tricuspid Valve: The tricuspid valve is normal in structure. Tricuspid valve regurgitation is mild. Aortic Valve: The aortic valve is tricuspid. Aortic valve regurgitation is mild. Aortic regurgitation PHT measures 453 msec. Pulmonic Valve: The pulmonic valve was not well visualized. Pulmonic valve regurgitation is trivial. Aorta: Aortic dilatation noted. There is  moderate dilatation of the ascending aorta, measuring 48 mm. There is mild dilatation of the aortic root, measuring 40 mm. Venous: The inferior vena cava is normal in size with greater than 50% respiratory variability, suggesting right atrial pressure of 3 mmHg. IAS/Shunts: No atrial level shunt detected by color flow Doppler.  LEFT VENTRICLE PLAX 2D LVIDd:         4.78 cm  Diastology LVIDs:         3.39 cm  LV e' medial:    5.44 cm/s LV PW:         1.09 cm  LV E/e' medial:  9.9 LV IVS:        1.09 cm  LV e' lateral:   4.35 cm/s LVOT diam:     2.00 cm  LV E/e' lateral: 12.3 LV SV:         68 LV SV Index:   33 LVOT Area:     3.14 cm  RIGHT VENTRICLE             IVC RV Basal diam:  3.62 cm     IVC diam: 1.87 cm RV S prime:     14.50 cm/s TAPSE (M-mode): 2.4 cm LEFT ATRIUM             Index        RIGHT ATRIUM           Index LA diam:        3.80 cm 1.86 cm/m  RA Area:     11.10 cm LA Vol (A2C):   43.0 ml 21.04 ml/m RA Volume:   22.60 ml  11.06 ml/m LA Vol (A4C):   32.6 ml 15.95 ml/m LA Biplane Vol: 38.3 ml 18.74 ml/m  AORTIC VALVE LVOT Vmax:   114.00 cm/s LVOT Vmean:  83.900 cm/s LVOT VTI:    0.215 m AI PHT:      453 msec  AORTA Ao Root diam: 4.00 cm Ao Asc diam:  4.80 cm MITRAL VALVE               TRICUSPID VALVE MV Area (PHT): 3.21 cm    TR Peak grad:   23.0 mmHg MV Decel Time: 236 msec    TR Vmax:        240.00 cm/s MV E velocity: 53.60 cm/s MV A velocity: 96.80 cm/s  SHUNTS MV E/A ratio:  0.55        Systemic VTI:  0.22 m                            Systemic Diam: 2.00 cm Kate Sable MD Electronically signed by Kate Sable MD Signature Date/Time: 01/07/2021/11:11:49 AM    Final    CT Renal Stone Study  Result Date: 01/02/2021 CLINICAL DATA:  Right flank pain for 7-10 days.  Recent lithotripsy. EXAM: CT ABDOMEN AND PELVIS WITHOUT CONTRAST TECHNIQUE: Multidetector CT imaging of the abdomen and pelvis was performed following the standard protocol without IV contrast. COMPARISON:  CT scan 12/08/2020 FINDINGS: Lower chest: The lung bases are clear of acute process. No pleural effusion or pulmonary lesions. The heart is normal in size. No pericardial effusion. Stable aortic and coronary artery calcifications. Stable moderate-sized hiatal hernia. Hepatobiliary: No hepatic lesions or intrahepatic biliary dilatation. The gallbladder is unremarkable. No common bile duct dilatation. Pancreas: No mass, inflammation or ductal dilatation. Spleen: Normal size.  No focal lesions. Adrenals/Urinary Tract: Adrenal glands are unremarkable and stable. Small  lower pole right renal calculus but no obstructing ureteral calculi or bladder calculi. The large UPJ calculus seen on the prior CT scan has been removed. No left-sided renal or ureteral calculi. No worrisome renal or bladder lesions without contrast.  Stomach/Bowel: The stomach, duodenum, small bowel and colon are grossly normal without oral contrast. No inflammatory changes, mass lesions or obstructive findings. The appendix is normal. Vascular/Lymphatic: Stable tortuosity and advanced atherosclerotic calcifications involving the aorta common iliac arteries and branch vessels. No mesenteric or retroperitoneal mass or adenopathy. Reproductive: Stable prostate gland enlargement with median lobe hypertrophy impressing on the base of the bladder. The seminal vesicles are unremarkable. Other: No pelvic mass or adenopathy. No free pelvic fluid collections. No inguinal mass or adenopathy. No abdominal wall hernia or subcutaneous lesions. Surgical changes related to a prior left-sided inguinal hernia repair. No recurrent hernia. Musculoskeletal: Age related osteoporosis. There is a new T12 compression fracture with mild retropulsion involving the posterosuperior aspect of the vertebral body but no significant canal compromise. This may be responsible for the patient's back pain. IMPRESSION: 1. Small lower pole right renal calculus but no obstructing ureteral calculi or bladder calculi. 2. No worrisome renal or bladder lesions without contrast. 3. No acute abdominal/pelvic findings, mass lesions or adenopathy. 4. Stable advanced atherosclerotic calcifications involving the aorta and branch vessels. 5. New T12 compression fracture with mild retropulsion but no significant canal compromise. This may be responsible for the patient's back pain. 6. Stable moderate-sized hiatal hernia. 7. Stable prostate gland enlargement with median lobe hypertrophy impressing on the base of the bladder. 8. Aortic atherosclerosis. Aortic Atherosclerosis (ICD10-I70.0). Electronically Signed   By: Marijo Sanes M.D.   On: 01/02/2021 10:13   LONG TERM MONITOR (3-14 DAYS)  Result Date: 01/21/2021  The patient was monitored for 4 days, 3 hours.  The predominant rhythm was sinus with an  average rate of 86 bpm (range 56-128 bpm and sinus).  There were rare PACs and PVCs.  165 atrial runs were observed, lasting up to 16.8 seconds with a maximum rate of 193 bpm.  No sustained arrhythmia or prolonged pause was observed.  Patient triggered event corresponds to sinus rhythm.  Predominately sinus rhythm with rare PACs and PVCs, as well as multiple episodes of PSVT lasting up to 17 seconds.    Labs:  CBC: Recent Labs    01/06/21 0452 01/07/21 0429 01/29/21 1425 01/30/21 0211  WBC 2.8* 2.8* 4.1 4.0  HGB 10.7* 10.4* 11.1* 11.4*  HCT 31.2* 30.8* 34.2* 34.7*  PLT 163 171 219 215    COAGS: Recent Labs    01/02/21 1410  INR 1.1    BMP: Recent Labs    01/06/21 0452 01/07/21 0429 01/29/21 1425 01/30/21 0211  NA 136 135 138 136  K 3.8 3.6 3.4* 3.2*  CL 105 101 104 104  CO2 24 24 24 25   GLUCOSE 104* 102* 106* 111*  BUN 33* 32* 21 19  CALCIUM 9.3 9.3 9.6 9.5  CREATININE 2.04* 1.97* 1.66* 1.58*  GFRNONAA 31* 33* 40* 43*    LIVER FUNCTION TESTS: Recent Labs    01/02/21 0857 01/29/21 1425  BILITOT 1.0 0.8  AST 28 17  ALT 11 11  ALKPHOS 106 104  PROT 6.6 6.1*  ALBUMIN 4.2 3.5     Assessment and Plan:  Symptomatic T12 and L1 compression fractures. Plan for image-guided T12/L1 kyphoplasty/vertebroplasty in IR with Dr. Estanislado Pandy pending IR scheduling and insurance authorization. Patient aware his insurance authorization is currently pending. Patient  will be NPO at midnight prior to procedure. Afebrile. He does not take blood thinners. INR pending.  Risks and benefits of T12/L1 kyphoplasty/vertebroplasty were discussed with the patient including, but not limited to education regarding the natural healing process of compression fractures without intervention, bleeding, infection, cement migration which may cause spinal cord damage, paralysis, pulmonary embolism or even death. This interventional procedure involves the use of X-rays and because of the  nature of the planned procedure, it is possible that we will have prolonged use of X-ray fluoroscopy. Potential radiation risks to you include (but are not limited to) the following: - A slightly elevated risk for cancer  several years later in life. This risk is typically less than 0.5% percent. This risk is low in comparison to the normal incidence of human cancer, which is 33% for women and 50% for men according to the [place]. - Radiation induced injury can include skin redness, resembling a rash, tissue breakdown / ulcers and hair loss (which can be temporary or permanent).  The likelihood of either of these occurring depends on the difficulty of the procedure and whether you are sensitive to radiation due to previous procedures, disease, or genetic conditions.  IF your procedure requires a prolonged use of radiation, you will be notified and given written instructions for further action.  It is your responsibility to monitor the irradiated area for the 2 weeks following the procedure and to notify your physician if you are concerned that you have suffered a radiation induced injury.   All of the patient's questions were answered, patient is agreeable to proceed. Consent signed and in IR control room.   ADDENDUM: Insurance authorization approved- plan for image-guided T12/L1 kyphoplasty/vertebroplasty in IR with Dr. Estanislado Pandy pending IR scheduling. Chrys Racer, RN notified- agrees to inform patient.   Thank you for this interesting consult.  I greatly enjoyed meeting Jack [name] and look forward to participating in their care.  A copy of this report was sent to the requesting provider on this date.  Electronically Signed: Earley Abide, PA-C 01/30/2021, 1:58 PM   I spent a total of 40 Minutes in face to face in clinical consultation, greater than 50% of which was counseling/coordinating care for T12 and L1 compression fractures/vertebral augmentation.

## 2021-01-30 NOTE — Consult Note (Signed)
Cardiology Consultation:   Patient ID: Jack [name] MRN: [id number]; DOB: July 26, 1933  Admit date: 01/29/2021 Date of Consult: 01/30/2021  PCP:  [name], MD   [facility] Providers Cardiologist:  End (consult 38/25/0539)JQB 34/19 Click here to update MD or APP on Care Team, Refresh:1}     Patient Profile:   Jack [name] is a 87 y.o. male with a hx of incidentally diagnosed coronary artery calcification aortic atherosclerosis, hypercholesterolemia, HTN, CKD 3, nephrolithiasis who is being seen 01/30/2021 for the evaluation of paroxysmal atrial fibrillation with RVR at the request of Dr. Waldron Labs.  History of Present Illness:    He was recently found to have atrial arrhythmia during the hospitalization for nephrolithiasis when he was also diagnosed with a compression fraction of T12.  At that time his arrhythmia was felt to be sinus tachycardia with frequent PACs as well as possible multifocal atrial tachycardia.  The arrhythmia resolved spontaneously.  EKG tracings from that admission only show sinus rhythm.  The plan was for him to have a 14-day event monitor at discharge.  Anticoagulation was not initiated.  He was completely oblivious to the change in heart rhythm.  He is currently admitted to [facility] with severe back pain and a plan for vertebroplasty on Monday.  On admission he was in sinus rhythm, but while here he abruptly converted to atrial fibrillation rapid ventricular response around 1600 hrs.  Rate control was difficult due to low blood pressure after administration of intravenous diltiazem.  Cardiology was consulted, but he spontaneously converted to sinus tachycardia just a few minutes ago after about 2 hours of atrial fibrillation.  Again, he is completely unaware of the change in rhythm.  The twelve-lead ECG performed this afternoon shows clear evidence of atrial fibrillation.  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  There are no ischemic  changes.  He is experiencing a lot of back pain and has some nausea.  He denies chest pain or shortness of breath.  Imaging studies have shown extensive calcification of the coronary system and aortic atherosclerosis, but he has never had clinical CAD or PAD.  Past Medical History:  Diagnosis Date  . Elevated prostate specific antigen (PSA)    has been 7 for a year   . GERD (gastroesophageal reflux disease)   . History of colon polyps 2008   [facility],   . History of kidney stones   . Hyperlipidemia   . Hypertension   . Prostate hypertrophy    diagnosed at age 32 due to hematospermia  . Renal disorder     Past Surgical History:  Procedure Laterality Date  . CATARACT EXTRACTION W/PHACO Left 01/10/2018   Procedure: CATARACT EXTRACTION PHACO AND INTRAOCULAR LENS PLACEMENT (IOC);  Surgeon: Birder Robson, MD;  Location: [facility] ORS;  Service: Ophthalmology;  Laterality: Left;  [name] 00:24.8 AP% 14.9 CDE 3.68 Fluid pack lot # [id number]  . CATARACT EXTRACTION W/PHACO Right 01/25/2018   Procedure: CATARACT EXTRACTION PHACO AND INTRAOCULAR LENS PLACEMENT (IOC);  Surgeon: Birder Robson, MD;  Location: [facility] ORS;  Service: Ophthalmology;  Laterality: Right;  [name] 00:42 AP% 10.8 CDE 4.59 Fluid pack lot # [id number]  . COLON SURGERY    . CYSTOSCOPY W/ URETERAL STENT PLACEMENT Right 10/16/2017   Procedure: right  URETERAL STENT PLACEMENT,cystoscopy bilateral stent removal,rretrograde;  Surgeon: Abbie Sons, MD;  Location: [facility] ORS;  Service: Urology;  Laterality: Right;  . CYSTOSCOPY/URETEROSCOPY/HOLMIUM LASER/STENT PLACEMENT Right 12/16/2020   Procedure: CYSTOSCOPY/URETEROSCOPY/HOLMIUM LASER/STENT PLACEMENT;  Surgeon: Bernardo Heater,  Ronda Fairly, MD;  Location: [facility] ORS;  Service: Urology;  Laterality: Right;  . EXTRACORPOREAL SHOCK WAVE LITHOTRIPSY Right 12/11/2020   Procedure: EXTRACORPOREAL SHOCK WAVE LITHOTRIPSY (ESWL);  Surgeon: Abbie Sons, MD;  Location: [facility] ORS;  Service: Urology;   Laterality: Right;  . KIDNEY STONE SURGERY    . RESECTION SOFT TISSUE TUMOR LEG / ANKLE RADICAL  jan 2009   [facility],  right thigh/knee , nonmalignant  . SMALL INTESTINE SURGERY  1946   implaed on picket fence, punctured stomach  . TONSILLECTOMY       Home Medications:  Prior to Admission medications   Medication Sig Start Date End Date Taking? Authorizing Provider  acetaminophen (TYLENOL) 325 MG tablet Take 2 tablets (650 mg total) by mouth every 6 (six) hours as needed for fever, headache or moderate pain. 01/07/21  Yes Wieting, Herman, MD  Ascorbic Acid (VITAMIN C) 1000 MG tablet Take 1,000 mg by mouth daily.   Yes [provider]  atorvastatin (LIPITOR) 40 MG tablet Take 1 tablet (40 mg total) by mouth daily at 6 PM. 01/07/21 01/07/22 Yes Wieting, Hassani, MD  cholecalciferol (VITAMIN D) 1000 UNITS tablet Take 1,000 Units by mouth daily.   Yes [provider]  Cyanocobalamin (VITAMIN B-12 PO) Take 1 tablet by mouth daily.   Yes [provider]  docusate sodium (COLACE) 100 MG capsule Take 1 capsule (100 mg total) by mouth 2 (two) times daily. 01/07/21  Yes Wieting, Jacksyn, MD  gabapentin (NEURONTIN) 100 MG capsule Take 100 mg by mouth 3 (three) times daily.   Yes [provider]  hydrALAZINE (APRESOLINE) 25 MG tablet Take 1 tablet (25 mg total) by mouth every 8 (eight) hours. 01/07/21  Yes Wieting, Vinicius, MD  magnesium oxide (MAG-OX) 400 MG tablet Take by mouth.   Yes [provider]  Multiple Vitamins-Minerals (PRESERVISION AREDS 2 PO) Take 1 capsule by mouth 2 (two) times daily.    Yes [provider]  omeprazole (PRILOSEC) 20 MG capsule Take 20 mg by mouth daily.   Yes [provider]  tamsulosin (FLOMAX) 0.4 MG CAPS capsule Take 1 capsule (0.4 mg total) by mouth daily. 12/15/20  Yes McGowan, Larene Beach A, PA-C  traMADol (ULTRAM) 50 MG tablet Take 50 mg by mouth every 6 (six) hours as needed for moderate pain. 01/26/21  Yes [provider]  Zinc 50 MG CAPS Take 50 mg by mouth daily.    Yes [provider]    Inpatient Medications: Scheduled Meds: . atorvastatin  40 mg Oral q1800  . calcium-vitamin D  1 tablet Oral BID WC  . docusate sodium  100 mg Oral BID  . feeding supplement  237 mL Oral BID BM  . heparin  4,000 Units Intravenous Once  . tamsulosin  0.4 mg Oral Daily   Continuous Infusions: . diltiazem (CARDIZEM) infusion 5 mg/hr (01/30/21 1641)  . heparin    . potassium chloride     PRN Meds: hydrALAZINE, morphine injection, ondansetron (ZOFRAN) IV, traMADol  Allergies:    Allergies  Allergen Reactions  . Amlodipine Other (See Comments)    LE edema  . Azithromycin Nausea And Vomiting  . Lisinopril Cough    Social History:   Social History   Socioeconomic History  . Marital status: Married    Spouse name: [name]  . Number of children: 4  . Years of education: 23  . Highest education level: Not on file  Occupational History  . Occupation: Retired  Employer: retired    Comment: Personal assistant   Tobacco Use  . Smoking status: Former Smoker    Quit date: 07/05/1965    Years since quitting: 55.6  . Smokeless tobacco: Never Used  Vaping Use  . Vaping Use: Never used  Substance and Sexual Activity  . Alcohol use: Yes    Alcohol/week: 1.0 standard drink    Types: 1 Standard drinks or equivalent per week    Comment: occassionaly  . Drug use: No  . Sexual activity: Yes    Birth control/protection: None  Other Topics Concern  . Not on file  Social History Narrative  . Not on file   Social Determinants of Health   Financial Resource Strain: Not on file  Food Insecurity: Not on file  Transportation Needs: Not on file  Physical Activity: Not on file  Stress: Not on file  Social Connections: Not on file  Intimate Partner Violence: Not on file    Family History:    Family History  Problem Relation Age of Onset  . Hypertension Father   . Hyperlipidemia Father   .  Cancer Sister        thyroid - dx in late 20's     ROS:  Please see the history of present illness.   All other ROS reviewed and negative.     Physical Exam/Data:   Vitals:   01/30/21 1202 01/30/21 1522 01/30/21 1655 01/30/21 1807  BP: (!) 163/95 105/90 97/70 112/70  Pulse: 100  (!) 117 (!) 110  Resp: 20 (!) 27 13 17   Temp: 98 F (36.7 C) 98 F (36.7 C) 98.1 F (36.7 C)   TempSrc: Oral Oral    SpO2: 92%  (!) 89% 94%  Weight:      Height:        Intake/Output Summary (Last 24 hours) at 01/30/2021 1848 Last data filed at 01/30/2021 1245 Gross per 24 hour  Intake 0 ml  Output 150 ml  Net -150 ml   Last 3 Weights 01/30/2021 01/02/2021 12/25/2020  Weight (lbs) 182 lb 12.2 oz 195 lb 200 lb 9.6 oz  Weight (kg) 82.9 kg 88.451 kg 90.992 kg     Body mass index is 26.99 kg/m.  General:  Well nourished, well developed, in no acute distress, but appears unwell HEENT: normal Lymph: no adenopathy Neck: no JVD Endocrine:  No thryomegaly Vascular: No carotid bruits; FA pulses 2+ bilaterally without bruits  Cardiac:  normal S1, S2; RRR; no murmur  Lungs:  clear to auscultation bilaterally, no wheezing, rhonchi or rales  Abd: soft, nontender, no hepatomegaly  Ext: no edema Musculoskeletal:  No deformities, BUE and BLE strength normal and equal Skin: warm and dry  Neuro:  CNs 2-12 intact, no focal abnormalities noted Psych:  Normal affect   EKG:  The EKG was personally reviewed and demonstrates: Atrial fibrillation rapid ventricular spots, left anterior fascicular block Telemetry:  Telemetry was personally reviewed and demonstrates: Roughly 2 hours of atrial fibrillation with rapid ventricular spots, currently in sinus tachycardia  Relevant CV Studies: Echocardiogram 01/06/2021   1. Left ventricular ejection fraction, by estimation, is 50 to 55%. The  left ventricle has low normal function. The left ventricle has no regional  wall motion abnormalities. There is mild left  ventricular hypertrophy.  Left ventricular diastolic  parameters are consistent with Grade I diastolic dysfunction (impaired  relaxation).  2. Right ventricular systolic function is normal. The right ventricular  size is normal.  3. The mitral valve  is normal in structure. No evidence of mitral valve  regurgitation.  4. The aortic valve is tricuspid. Aortic valve regurgitation is mild.  5. Aortic dilatation noted. There is moderate dilatation of the ascending  aorta, measuring 48 mm. There is mild dilatation of the aortic root,  measuring 40 mm.  6. The inferior vena cava is normal in size with greater than 50%  respiratory variability, suggesting right atrial pressure of 3 mmHg.   Laboratory Data:  High Sensitivity Troponin:  No results for input(s): TROPONINIHS in the last 720 hours.   Chemistry Recent Labs  Lab 01/29/21 1425 01/30/21 0211  NA 138 136  K 3.4* 3.2*  CL 104 104  CO2 24 25  GLUCOSE 106* 111*  BUN 21 19  CREATININE 1.66* 1.58*  CALCIUM 9.6 9.5  GFRNONAA 40* 43*  ANIONGAP 10 7    Recent Labs  Lab 01/29/21 1425  PROT 6.1*  ALBUMIN 3.5  AST 17  ALT 11  ALKPHOS 104  BILITOT 0.8   Hematology Recent Labs  Lab 01/29/21 1425 01/30/21 0211  WBC 4.1 4.0  RBC 3.65* 3.75*  HGB 11.1* 11.4*  HCT 34.2* 34.7*  MCV 93.7 92.5  MCH 30.4 30.4  MCHC 32.5 32.9  RDW 14.6 14.5  PLT 219 215   BNPNo results for input(s): BNP, PROBNP in the last 168 hours.  DDimer No results for input(s): DDIMER in the last 168 hours.   Radiology/Studies:  CT ABDOMEN PELVIS WO CONTRAST  Result Date: 01/29/2021 CLINICAL DATA:  Acute generalized abdominal pain and distention. EXAM: CT ABDOMEN AND PELVIS WITHOUT CONTRAST TECHNIQUE: Multidetector CT imaging of the abdomen and pelvis was performed following the standard protocol without IV contrast. COMPARISON:  January 02, 2021. FINDINGS: Lower chest: Moderate size sliding-type hiatal hernia is noted. Visualized lung bases are  unremarkable. Hepatobiliary: No focal liver abnormality is seen. No gallstones, gallbladder wall thickening, or biliary dilatation. Pancreas: Unremarkable. No pancreatic ductal dilatation or surrounding inflammatory changes. Spleen: Normal in size without focal abnormality. Adrenals/Urinary Tract: Adrenal glands are unremarkable. Kidneys are normal, without renal calculi, focal lesion, or hydronephrosis. Bladder is unremarkable. Stomach/Bowel: Stomach is within normal limits. Appendix appears normal. No evidence of bowel wall thickening, distention, or inflammatory changes. Vascular/Lymphatic: Aortic atherosclerosis. No enlarged abdominal or pelvic lymph nodes. Reproductive: Stable moderate prostatic enlargement is noted. Other: No abdominal wall hernia or abnormality. No abdominopelvic ascites. Musculoskeletal: Stable subacute to old T12 compression fracture. IMPRESSION: Moderate size sliding-type hiatal hernia. Stable moderate prostatic enlargement. No acute abnormality seen in the abdomen or pelvis. Aortic Atherosclerosis (ICD10-I70.0). Electronically Signed   By: Marijo Conception M.D.   On: 01/29/2021 16:05   CT Thoracic Spine Wo Contrast  Result Date: 01/29/2021 CLINICAL DATA:  Mid back pain; mid back pain, history of T12 fracture, evaluate for additional trauma. Additional provided: Patient reports back pain from old injury, nausea, symptoms persist in for 3 days. EXAM: CT THORACIC SPINE WITHOUT CONTRAST TECHNIQUE: Multidetector CT images of the thoracic were obtained using the standard protocol without intravenous contrast. COMPARISON:  CT abdomen/pelvis 01/02/2021. FINDINGS: Alignment: No significant spondylolisthesis. Unchanged mild bony retropulsion at the level of the T12 superior endplate. Vertebrae: Mild interval progression of height loss at site of a previously demonstrated T12 compression fracture (now 60%). Vertebral body height is otherwise maintained within the thoracic spine. No evidence of  fracture elsewhere within the thoracic spine. T9 vertebral body hemangioma. Multilevel ventrolateral osteophytes. Paraspinal and other soft tissues: Linear atelectasis within the imaged lung bases.  Hiatal hernia. Aortoiliac atherosclerosis. Disc levels: Multilevel disc space narrowing. Most notably, there is moderate to moderately advanced disc space narrowing at the T7-T8 through T9-T12 levels. At T11-T12, there is mild bony retropulsion at the level of the T12 superior endplate. Associated disc bulge. Mild relative narrowing of the ventral spinal canal. Mild bilateral neural foraminal narrowing. No appreciable significant disc herniation or spinal canal stenosis at the remaining thoracic levels. No compressive foraminal narrowing. IMPRESSION: Mild interval progression of a previously demonstrated T12 compression fracture (now with 60% height loss). Unchanged mild bony retropulsion at the level of the T12 superior endplate. Associated disc bulge at T11-T12. Resultant mild relative narrowing of the spinal canal at this level. Mild bilateral T11-T12 neural foraminal narrowing. No other thoracic spine fracture is identified. No significant disc herniation, spinal canal stenosis or compressive neural foraminal narrowing at the remaining thoracic levels. Linear atelectasis within the imaged lung bases. Hiatal hernia. Aortic Atherosclerosis (ICD10-I70.0). Electronically Signed   By: Kellie Simmering DO   On: 01/29/2021 16:30   MR THORACIC SPINE WO CONTRAST  Result Date: 01/29/2021 CLINICAL DATA:  Mid back pain with compression fracture. EXAM: MRI THORACIC AND LUMBAR SPINE WITHOUT CONTRAST TECHNIQUE: Multiplanar and multiecho pulse sequences of the thoracic and lumbar spine were obtained without intravenous contrast. COMPARISON:  CT abdomen pelvis 10/16/2017 CT chest 11/21/2017 CT thoracic and lumbar spine 01/29/2021 FINDINGS: MRI THORACIC SPINE FINDINGS Alignment:  Normal Vertebrae: Compression fracture of T12 with  approximately 50% height loss and moderate edema. 5 mm of retropulsion. T9 hemangioma. High fluid sensitive signal within the T8 vertebral body near the inferior endplate, possibly a Schmorl's node. Cord:  Normal signal and morphology. Paraspinal and other soft tissues: Negative. Disc levels: No spinal canal stenosis. MRI LUMBAR SPINE FINDINGS Segmentation:  Standard Alignment:  Normal Vertebrae: High fluid sensitive signal focus in the L4 vertebral body. L1 compression fracture with less than 25% height loss. Minimal retropulsion. Conus medullaris and cauda equina: Conus extends to the L2 level. Conus and cauda equina appear normal. Paraspinal and other soft tissues: Negative. Disc levels: L1-L2: Normal disc space and facet joints. No spinal canal stenosis. No neural foraminal stenosis. L2-L3: Normal disc space and facet joints. No spinal canal stenosis. No neural foraminal stenosis. L3-L4: Intermediate disc bulge. No spinal canal stenosis. No neural foraminal stenosis. L4-L5: Mild facet hypertrophy with mild left asymmetric disc bulge. No spinal canal stenosis. No neural foraminal stenosis. L5-S1: Normal disc space and facet joints. No spinal canal stenosis. No neural foraminal stenosis. Visualized sacrum: Normal. IMPRESSION: 1. Compression fractures of T12 and L1 with approximately 50% and less than 25% height loss, respectively. Mild retropulsion with no associated stenosis. 2. Indeterminate fluid-sensitive signal hyperintensities in the T8 and L4 vertebrae. I suspect these are Schmorl's nodes based on comparison with prior CTs. 3. No thoracic or lumbar spinal canal or neural foraminal stenosis. Electronically Signed   By: Ulyses Jarred M.D.   On: 01/29/2021 21:34   MR LUMBAR SPINE WO CONTRAST  Result Date: 01/29/2021 CLINICAL DATA:  Mid back pain with compression fracture. EXAM: MRI THORACIC AND LUMBAR SPINE WITHOUT CONTRAST TECHNIQUE: Multiplanar and multiecho pulse sequences of the thoracic and lumbar  spine were obtained without intravenous contrast. COMPARISON:  CT abdomen pelvis 10/16/2017 CT chest 11/21/2017 CT thoracic and lumbar spine 01/29/2021 FINDINGS: MRI THORACIC SPINE FINDINGS Alignment:  Normal Vertebrae: Compression fracture of T12 with approximately 50% height loss and moderate edema. 5 mm of retropulsion. T9 hemangioma. High fluid sensitive signal within the  T8 vertebral body near the inferior endplate, possibly a Schmorl's node. Cord:  Normal signal and morphology. Paraspinal and other soft tissues: Negative. Disc levels: No spinal canal stenosis. MRI LUMBAR SPINE FINDINGS Segmentation:  Standard Alignment:  Normal Vertebrae: High fluid sensitive signal focus in the L4 vertebral body. L1 compression fracture with less than 25% height loss. Minimal retropulsion. Conus medullaris and cauda equina: Conus extends to the L2 level. Conus and cauda equina appear normal. Paraspinal and other soft tissues: Negative. Disc levels: L1-L2: Normal disc space and facet joints. No spinal canal stenosis. No neural foraminal stenosis. L2-L3: Normal disc space and facet joints. No spinal canal stenosis. No neural foraminal stenosis. L3-L4: Intermediate disc bulge. No spinal canal stenosis. No neural foraminal stenosis. L4-L5: Mild facet hypertrophy with mild left asymmetric disc bulge. No spinal canal stenosis. No neural foraminal stenosis. L5-S1: Normal disc space and facet joints. No spinal canal stenosis. No neural foraminal stenosis. Visualized sacrum: Normal. IMPRESSION: 1. Compression fractures of T12 and L1 with approximately 50% and less than 25% height loss, respectively. Mild retropulsion with no associated stenosis. 2. Indeterminate fluid-sensitive signal hyperintensities in the T8 and L4 vertebrae. I suspect these are Schmorl's nodes based on comparison with prior CTs. 3. No thoracic or lumbar spinal canal or neural foraminal stenosis. Electronically Signed   By: Ulyses Jarred M.D.   On: 01/29/2021 21:34    CT L-SPINE NO CHARGE  Result Date: 01/29/2021 CLINICAL DATA:  Back pain. Additional provided: Patient reports back pain from an old injury. Nausea, symptoms persistent for 3 days. EXAM: CT LUMBAR SPINE WITHOUT CONTRAST TECHNIQUE: Multidetector CT imaging of the lumbar spine was performed without intravenous contrast administration. Multiplanar CT image reconstructions were also generated. COMPARISON:  CT abdomen/pelvis 01/02/2021. FINDINGS: Segmentation: 5 lumbar vertebrae. The caudal most well-formed intervertebral disc space is designated L5-S1. Alignment: Mild lumbar dextrocurvature. Unchanged mild bony retropulsion at the level of the T12 superior endplate. Minimal bony retropulsion is present at the L1 superior endplate, at site of an interval compression fracture. Trace L4-L5 grade 1 anterolisthesis, unchanged. Vertebrae: New from the prior CT abdomen/pelvis of 01/02/2021, there is a mild L2-1 vertebral compression fracture (10-20% height loss). There has also been subtle progressive interval height loss at site of a previously demonstrated T12 vertebral compression fracture (now with 60% height loss anteriorly). Vertebral body height is otherwise maintained. No fracture is identified elsewhere within the lumbar spine. Redemonstrated partial fusion across the right greater than left sacroiliac joints. Paraspinal and other soft tissues: Aortoiliac atherosclerosis. Sigmoid diverticulosis. Paraspinal soft tissues within normal limits. Disc levels: No more than mild disc degeneration at any level. T11-T12: Unchanged mild bony retropulsion at the level of the T12 superior endplate. Associated disc bulge. Mild relative narrowing of the ventral spinal canal. Mild left neural foraminal narrowing. T12-L1: Trace bony retropulsion at the level of the L1 superior endplate. Small disc bulge. No significant spinal canal stenosis. Mild/moderate bilateral neural foraminal narrowing. L1-L2: Disc bulge. No appreciable  significant spinal canal stenosis or neural foraminal narrowing. L2-L3: Disc bulge. No appreciable significant spinal canal stenosis or neural foraminal narrowing. L3-L4: Disc bulge. Facet arthrosis/ligamentum flavum hypertrophy. Mild/moderate central canal stenosis with bilateral subarticular narrowing. Mild bilateral neural foraminal narrowing. L4-L5: Trace grade 1 anterolisthesis. Disc uncovering with disc bulge. Facet arthrosis/ligamentum flavum hypertrophy. Mild/moderate central canal stenosis with bilateral subarticular narrowing. Mild bilateral neural foraminal narrowing. L5-S1: No significant disc herniation or stenosis. IMPRESSION: Mild L1 vertebral compression deformity, new as compared to the CT abdomen/pelvis of  01/02/2021 and acute/subacute. Interval progression of height loss at site of a previously demonstrated T12 compression fracture (now with up to 60% height loss). Unchanged mild bony retropulsion at the level of the T12 superior endplate. Lumbar spondylosis, as outlined. Multilevel spinal canal stenosis. Most notably, there is multifactorial apparent mild/moderate central canal stenosis at L3-L4 and L4-L5 with bilateral subarticular narrowing. Sites of mild and mild/moderate neural foraminal narrowing, as detailed. Partial fusion of the sacroiliac joints, bilaterally. Aortic Atherosclerosis (ICD10-I70.0). Sigmoid diverticulosis. Electronically Signed   By: Kellie Simmering DO   On: 01/29/2021 16:21     Assessment and Plan:   1. AFib: This has resolved spontaneously but is likely to recur due to hyperadrenergic state related to pain in anticipation of surgery.  He was difficult to rate control with low blood pressure and I would recommend intravenous amiodarone if the arrhythmia recurs.  He should receive a bolus of 150 mg IV (this can be skipped if his systolic blood pressure is less than 100), followed by a 6-hour infusion at 1 mg/minute, then 0.5 mg/minute indefinitely.  I also recommend  initiating intravenous heparin now, regardless whether it recurs.  This will allow for safer immediate cardioversion if this should become urgently necessary.  After he has completed his vertebroplasty, would plan to discharge home with a direct oral anticoagulant such as Eliquis or Xarelto. 2. Coronary/aortic atherosclerosis: He did not have angina or ischemic ST changeseven when his heart rate was in the 140's.  Formal evaluation for coronary insufficiency can be performed at a later date.  He does not appear to have clinically relevant CAD or PAD to date. 3. HLP: He has been started on atorvastatin.  Target LDL less than 70. 4. Preop surgical risk eval: The planned vertebroplasty is a relatively low risk intervention. The procedure may actually help reduce the recurrence of arrhythmia via improved pain control.   Risk Assessment/Risk Scores:          CHA2DS2-VASc Score = 4  This indicates a 4.8% annual risk of stroke. The patient's score is based upon: CHF History: No HTN History: Yes Diabetes History: No Stroke History: No Vascular Disease History: Yes Age Score: 2 Gender Score: 0         For questions or updates, please contact [place] Please consult [email or web address] for contact info under    Signed, Sanda Klein, MD  01/30/2021 6:48 PM

## 2021-01-30 NOTE — Progress Notes (Addendum)
Pt appeared to be in a-fib after attempt at bm. Tele called to tell this RN that pt was tachycardic, but on further inspection was in a-fib. Text-page sent to Dr. Waldron [facility], who placed orders for EKG and Cardizem drip with bolus. EKG confirmed that pt was in a-fib. Pt, and wife at bedside, said that the last time the pt was in the hospital he had appeared to be in a-fib but EKG showed otherwise. Pt also sent home from that visit on heart monitor for 3 days, which showed no unusual rhythms or activity. Pt and wife informed that this time truly is a-fib. Cardizem bolus and drip initiated. A few minutes into the Cardizem drip, pt began scratching his arm and said that it felt itchy around the IV site. Bp 97/70, temp 98.1. Pt denies SOB. Infusion paused and Dr. Bonney Aid was messaged again, to verify whether or not to continue with infusion. He gave orders to continue and placed new orders as well.   Justice Rocher, RN

## 2021-01-30 NOTE — Progress Notes (Signed)
This RN messaged MD about pt's pain control and bowel regimen. Pt has reportedly not had BM in 5 days. Pt is also complaining of pain that is not well controlled. Currently only has 1 mg Morphine ordered, no PO pain medications. See new orders for Miralax, Morphine 2mg , and Vicodin.   After speaking to pt, he expressed that Tramadol had worked well for him in the past and that he was concerned that Vicodin may make him feel sick. Messaged MD again, and orders were changed to d/c Vicodin and add Tramadol.   11:30 - Pt still not had a bm at this time. MD was messaged and placed orders for suppository, mag citrate, and enema. Pt also has bp of 174/109 and does not have orders for bp medication scheduled or PRN except for Hydralazine 25 mg at 1400. MD placed orders for IV Hydralazine, which was effective.   Justice Rocher, RN

## 2021-01-31 ENCOUNTER — Inpatient Hospital Stay (HOSPITAL_COMMUNITY): Payer: PPO

## 2021-01-31 DIAGNOSIS — I480 Paroxysmal atrial fibrillation: Secondary | ICD-10-CM | POA: Diagnosis not present

## 2021-01-31 DIAGNOSIS — S22080D Wedge compression fracture of T11-T12 vertebra, subsequent encounter for fracture with routine healing: Secondary | ICD-10-CM | POA: Diagnosis not present

## 2021-01-31 DIAGNOSIS — E43 Unspecified severe protein-calorie malnutrition: Secondary | ICD-10-CM | POA: Diagnosis present

## 2021-01-31 LAB — PROTIME-INR
INR: 1.2 (ref 0.8–1.2)
Prothrombin Time: 15.3 seconds — ABNORMAL HIGH (ref 11.4–15.2)

## 2021-01-31 LAB — CBC
HCT: 35.5 % — ABNORMAL LOW (ref 39.0–52.0)
Hemoglobin: 11.6 g/dL — ABNORMAL LOW (ref 13.0–17.0)
MCH: 30 pg (ref 26.0–34.0)
MCHC: 32.7 g/dL (ref 30.0–36.0)
MCV: 91.7 fL (ref 80.0–100.0)
Platelets: 242 10*3/uL (ref 150–400)
RBC: 3.87 MIL/uL — ABNORMAL LOW (ref 4.22–5.81)
RDW: 14.8 % (ref 11.5–15.5)
WBC: 5.1 10*3/uL (ref 4.0–10.5)
nRBC: 0 % (ref 0.0–0.2)

## 2021-01-31 LAB — BASIC METABOLIC PANEL
Anion gap: 11 (ref 5–15)
BUN: 22 mg/dL (ref 8–23)
CO2: 26 mmol/L (ref 22–32)
Calcium: 10.3 mg/dL (ref 8.9–10.3)
Chloride: 102 mmol/L (ref 98–111)
Creatinine, Ser: 1.68 mg/dL — ABNORMAL HIGH (ref 0.61–1.24)
GFR, Estimated: 40 mL/min — ABNORMAL LOW (ref >60)
Glucose, Bld: 127 mg/dL — ABNORMAL HIGH (ref 70–99)
Potassium: 3.8 mmol/L (ref 3.5–5.1)
Sodium: 139 mmol/L (ref 135–145)

## 2021-01-31 LAB — MAGNESIUM: Magnesium: 2.4 mg/dL (ref 1.7–2.4)

## 2021-01-31 LAB — HEPARIN LEVEL (UNFRACTIONATED)
Heparin Unfractionated: 0.41 IU/mL (ref 0.30–0.70)
Heparin Unfractionated: 0.4 IU/mL (ref 0.30–0.70)

## 2021-01-31 MED ORDER — BISACODYL 5 MG PO TBEC
10.0000 mg | DELAYED_RELEASE_TABLET | Freq: Every day | ORAL | Status: AC
  Administered 2021-01-31 – 2021-02-01 (×2): 10 mg via ORAL
  Filled 2021-01-31 (×2): qty 2

## 2021-01-31 MED ORDER — AMIODARONE HCL 200 MG PO TABS
200.0000 mg | ORAL_TABLET | Freq: Every day | ORAL | Status: DC
  Administered 2021-02-07: 200 mg via ORAL
  Filled 2021-01-31: qty 1

## 2021-01-31 MED ORDER — AMIODARONE HCL 200 MG PO TABS
400.0000 mg | ORAL_TABLET | Freq: Two times a day (BID) | ORAL | Status: AC
  Administered 2021-01-31 – 2021-02-06 (×14): 400 mg via ORAL
  Filled 2021-01-31 (×14): qty 2

## 2021-01-31 MED ORDER — POTASSIUM CHLORIDE CRYS ER 20 MEQ PO TBCR
20.0000 meq | EXTENDED_RELEASE_TABLET | Freq: Once | ORAL | Status: AC
  Administered 2021-01-31: 20 meq via ORAL
  Filled 2021-01-31: qty 1

## 2021-01-31 MED ORDER — ENSURE ENLIVE PO LIQD
237.0000 mL | Freq: Three times a day (TID) | ORAL | Status: DC
  Administered 2021-01-31 – 2021-02-05 (×9): 237 mL via ORAL

## 2021-01-31 NOTE — Progress Notes (Signed)
[place] for heparin Indication: atrial fibrillation  Allergies  Allergen Reactions  . Amlodipine Other (See Comments)    LE edema  . Azithromycin Nausea And Vomiting  . Lisinopril Cough    Patient Measurements: Height: 5\' 9"  (175.3 cm) Weight: 82.9 kg (182 lb 12.2 oz) IBW/kg (Calculated) : 70.7 Heparin Dosing Weight: 82.9 kg   Vital Signs: Temp: 98.8 F (37.1 C) (05/20 1900) Temp Source: Oral (05/20 1900) BP: 110/91 (05/20 2103) Pulse Rate: 100 (05/20 2103)  Labs: Recent Labs    01/29/21 1425 01/30/21 0211 01/31/21 0138  HGB 11.1* 11.4* 11.6*  HCT 34.2* 34.7* 35.5*  PLT 219 215 242  LABPROT  --   --  15.3*  INR  --   --  1.2  HEPARINUNFRC  --   --  0.41  CREATININE 1.66* 1.58* 1.68*    Estimated Creatinine Clearance: 32.1 mL/min (A) (by C-G formula based on SCr of 1.68 mg/dL (H)).  Assessment: 87 y.o. male with Afib for heparin   Goal of Therapy:  Heparin level 0.3-0.7 units/ml Monitor platelets by anticoagulation protocol: Yes   Plan:   Continue Heparin at current rate  Recheck level in 8 hours to verify  Phillis Knack, PharmD, BCPS

## 2021-01-31 NOTE — Progress Notes (Addendum)
PROGRESS NOTE    Jack [name]  RWE:[id number] DOB: Jul 12, 1933 DOA: 01/29/2021 PCP: [name], MD  Chief Complaint  Patient presents with  . Back Pain  . Nausea    Brief Narrative:     Jack [name] is a 87 y.o. male with history of hypertension, anemia, chronic kidney disease stage III who was recently admitted at [facility] for T12 compression fracture subsequently discharged to rehab was in rehab for 2 weeks discharged last week from rehab was doing well with the TLSO brace started having worsening pain over the last 3 days mostly in the low back with no radiation or any associated incontinence of urine or bowel.  Denies any fever chills.  Given the worsening pain patient came to the ER. MRI done which shows T12 and new L1 compression fracture , seen by neurosurgery, neuro interventional radiology, recommendation for kyphoplasty, patient developed A. fib with RVR 5/20.  Assessment & Plan:   Principal Problem:   T12 compression fracture (HCC) Active Problems:   Protein-calorie malnutrition, severe   T12 and new L1 compression fracture for which neurosurgery has been consulted.  -  Recommendation for kyphoplasty, I have discussed with his neurosurgeon group at [facility], and he wishes to proceed with the procedure here, so interventional radiology were consulted, plan for kyphoplasty on Monday. -Continue with as needed meds  Hypertension  - Patient used to be on ARB and hydrochlorothiazide which was recently discontinued due to worsening renal function. -Blood pressure better improved after increasing hydralazine, continue with as needed hydralazine, hydralazine has been stopped given soft blood pressure with Cardizem drip.  A. fib with RVR -Required Cardizem drip initially, converted to normal sinus rhythm, cardiology input input greatly appreciated, currently on amiodarone. -He is on IV heparin drip currently , can do Eliquis after  procedure.  Hyperlipidemia  -on statins.  Anemia likely from renal disease.  - Appears to be baseline.  Hypokalemia -Repleted  CKD stage IIIb -Avoid nephrotoxic medications  Severe protein calorie malnutrition -Nutrition is consulted, continue with supplement  Nausea -We will check KUB.    DVT prophylaxis:  On heparin GTT  Code Status: Full Family Communication: D/W wife by phone  Disposition:   Status is: Inpatient  Remains inpatient appropriate because:IV treatments appropriate due to intensity of illness or inability to take PO   Dispo: The patient is from: SNF              Anticipated d/c is to: SNF              Patient currently is not medically stable to d/c.   Difficult to place patient No       Consultants:   Interventional radiology  Cardiology   Subjective:  Still complaining of back pain, he does report some nausea, he had a bowel movement with enema yesterday.  Objective: Vitals:   01/30/21 2031 01/30/21 2103 01/31/21 0518 01/31/21 0824  BP:  (!) 110/91 127/75 (!) 143/91  Pulse: (!) 109 100 79 83  Resp: (!) 23 18 16 16   Temp:   97.8 F (36.6 C) 97.8 F (36.6 C)  TempSrc:   Oral Oral  SpO2: 94% 95% 97% 98%  Weight:      Height:        Intake/Output Summary (Last 24 hours) at 01/31/2021 1119 Last data filed at 01/31/2021 0626 Gross per 24 hour  Intake 0 ml  Output 250 ml  Net -250 ml   [employer]  01/30/21 0529  Weight: 82.9 kg    Examination:  Awake Alert, Oriented X 3, No new F.N deficits, Normal affect Symmetrical Chest wall movement, Good air movement bilaterally, CTAB RRR,No Gallops,Rubs or new Murmurs, No Parasternal Heave +ve B.Sounds, Abd Soft, No tenderness, No rebound - guarding or rigidity. No Cyanosis, Clubbing or edema, No new Rash or bruise       Data Reviewed: I have personally reviewed following labs and imaging studies  CBC: Recent Labs  Lab 01/29/21 1425 01/30/21 0211 01/31/21 0138  WBC  4.1 4.0 5.1  NEUTROABS 3.0  --   --   HGB 11.1* 11.4* 11.6*  HCT 34.2* 34.7* 35.5*  MCV 93.7 92.5 91.7  PLT 219 215 627    Basic Metabolic Panel: Recent Labs  Lab 01/29/21 1425 01/30/21 0211 01/31/21 0138  NA 138 136 139  K 3.4* 3.2* 3.8  CL 104 104 102  CO2 24 25 26   GLUCOSE 106* 111* 127*  BUN 21 19 22   CREATININE 1.66* 1.58* 1.68*  CALCIUM 9.6 9.5 10.3  MG  --   --  2.4    GFR: Estimated Creatinine Clearance: 32.1 mL/min (A) (by C-G formula based on SCr of 1.68 mg/dL (H)).  Liver Function Tests: Recent Labs  Lab 01/29/21 1425  AST 17  ALT 11  ALKPHOS 104  BILITOT 0.8  PROT 6.1*  ALBUMIN 3.5    CBG: No results for input(s): GLUCAP in the last 168 hours.   Recent Results (from the past 240 hour(s))  Surgical pcr screen     Status: None   Collection Time: 01/30/21  5:20 AM   Specimen: Nasal Mucosa; Nasal Swab  Result Value Ref Range Status   MRSA, PCR NEGATIVE NEGATIVE Final   Staphylococcus aureus NEGATIVE NEGATIVE Final    Comment: (NOTE) The Xpert SA Assay (FDA approved for NASAL specimens in patients 52 years of age and older), is one component of a comprehensive surveillance program. It is not intended to diagnose infection nor to guide or monitor treatment. Performed at [facility], [place] [address], [place], [place] [address]          Radiology Studies: CT ABDOMEN PELVIS WO CONTRAST  Result Date: 01/29/2021 CLINICAL DATA:  Acute generalized abdominal pain and distention. EXAM: CT ABDOMEN AND PELVIS WITHOUT CONTRAST TECHNIQUE: Multidetector CT imaging of the abdomen and pelvis was performed following the standard protocol without IV contrast. COMPARISON:  January 02, 2021. FINDINGS: Lower chest: Moderate size sliding-type hiatal hernia is noted. Visualized lung bases are unremarkable. Hepatobiliary: No focal liver abnormality is seen. No gallstones, gallbladder wall thickening, or biliary dilatation. Pancreas: Unremarkable. No pancreatic  ductal dilatation or surrounding inflammatory changes. Spleen: Normal in size without focal abnormality. Adrenals/Urinary Tract: Adrenal glands are unremarkable. Kidneys are normal, without renal calculi, focal lesion, or hydronephrosis. Bladder is unremarkable. Stomach/Bowel: Stomach is within normal limits. Appendix appears normal. No evidence of bowel wall thickening, distention, or inflammatory changes. Vascular/Lymphatic: Aortic atherosclerosis. No enlarged abdominal or pelvic lymph nodes. Reproductive: Stable moderate prostatic enlargement is noted. Other: No abdominal wall hernia or abnormality. No abdominopelvic ascites. Musculoskeletal: Stable subacute to old T12 compression fracture. IMPRESSION: Moderate size sliding-type hiatal hernia. Stable moderate prostatic enlargement. No acute abnormality seen in the abdomen or pelvis. Aortic Atherosclerosis (ICD10-I70.0). Electronically Signed   By: Marijo Conception M.D.   On: 01/29/2021 16:05   CT Thoracic Spine Wo Contrast  Result Date: 01/29/2021 CLINICAL DATA:  Mid back pain; mid back pain, history  of T12 fracture, evaluate for additional trauma. Additional provided: Patient reports back pain from old injury, nausea, symptoms persist in for 3 days. EXAM: CT THORACIC SPINE WITHOUT CONTRAST TECHNIQUE: Multidetector CT images of the thoracic were obtained using the standard protocol without intravenous contrast. COMPARISON:  CT abdomen/pelvis 01/02/2021. FINDINGS: Alignment: No significant spondylolisthesis. Unchanged mild bony retropulsion at the level of the T12 superior endplate. Vertebrae: Mild interval progression of height loss at site of a previously demonstrated T12 compression fracture (now 60%). Vertebral body height is otherwise maintained within the thoracic spine. No evidence of fracture elsewhere within the thoracic spine. T9 vertebral body hemangioma. Multilevel ventrolateral osteophytes. Paraspinal and other soft tissues: Linear atelectasis  within the imaged lung bases. Hiatal hernia. Aortoiliac atherosclerosis. Disc levels: Multilevel disc space narrowing. Most notably, there is moderate to moderately advanced disc space narrowing at the T7-T8 through T9-T12 levels. At T11-T12, there is mild bony retropulsion at the level of the T12 superior endplate. Associated disc bulge. Mild relative narrowing of the ventral spinal canal. Mild bilateral neural foraminal narrowing. No appreciable significant disc herniation or spinal canal stenosis at the remaining thoracic levels. No compressive foraminal narrowing. IMPRESSION: Mild interval progression of a previously demonstrated T12 compression fracture (now with 60% height loss). Unchanged mild bony retropulsion at the level of the T12 superior endplate. Associated disc bulge at T11-T12. Resultant mild relative narrowing of the spinal canal at this level. Mild bilateral T11-T12 neural foraminal narrowing. No other thoracic spine fracture is identified. No significant disc herniation, spinal canal stenosis or compressive neural foraminal narrowing at the remaining thoracic levels. Linear atelectasis within the imaged lung bases. Hiatal hernia. Aortic Atherosclerosis (ICD10-I70.0). Electronically Signed   By: Kellie Simmering DO   On: 01/29/2021 16:30   MR THORACIC SPINE WO CONTRAST  Result Date: 01/29/2021 CLINICAL DATA:  Mid back pain with compression fracture. EXAM: MRI THORACIC AND LUMBAR SPINE WITHOUT CONTRAST TECHNIQUE: Multiplanar and multiecho pulse sequences of the thoracic and lumbar spine were obtained without intravenous contrast. COMPARISON:  CT abdomen pelvis 10/16/2017 CT chest 11/21/2017 CT thoracic and lumbar spine 01/29/2021 FINDINGS: MRI THORACIC SPINE FINDINGS Alignment:  Normal Vertebrae: Compression fracture of T12 with approximately 50% height loss and moderate edema. 5 mm of retropulsion. T9 hemangioma. High fluid sensitive signal within the T8 vertebral body near the inferior endplate,  possibly a Schmorl's node. Cord:  Normal signal and morphology. Paraspinal and other soft tissues: Negative. Disc levels: No spinal canal stenosis. MRI LUMBAR SPINE FINDINGS Segmentation:  Standard Alignment:  Normal Vertebrae: High fluid sensitive signal focus in the L4 vertebral body. L1 compression fracture with less than 25% height loss. Minimal retropulsion. Conus medullaris and cauda equina: Conus extends to the L2 level. Conus and cauda equina appear normal. Paraspinal and other soft tissues: Negative. Disc levels: L1-L2: Normal disc space and facet joints. No spinal canal stenosis. No neural foraminal stenosis. L2-L3: Normal disc space and facet joints. No spinal canal stenosis. No neural foraminal stenosis. L3-L4: Intermediate disc bulge. No spinal canal stenosis. No neural foraminal stenosis. L4-L5: Mild facet hypertrophy with mild left asymmetric disc bulge. No spinal canal stenosis. No neural foraminal stenosis. L5-S1: Normal disc space and facet joints. No spinal canal stenosis. No neural foraminal stenosis. Visualized sacrum: Normal. IMPRESSION: 1. Compression fractures of T12 and L1 with approximately 50% and less than 25% height loss, respectively. Mild retropulsion with no associated stenosis. 2. Indeterminate fluid-sensitive signal hyperintensities in the T8 and L4 vertebrae. I suspect these are Schmorl's nodes based  on comparison with prior CTs. 3. No thoracic or lumbar spinal canal or neural foraminal stenosis. Electronically Signed   By: Ulyses Jarred M.D.   On: 01/29/2021 21:34   MR LUMBAR SPINE WO CONTRAST  Result Date: 01/29/2021 CLINICAL DATA:  Mid back pain with compression fracture. EXAM: MRI THORACIC AND LUMBAR SPINE WITHOUT CONTRAST TECHNIQUE: Multiplanar and multiecho pulse sequences of the thoracic and lumbar spine were obtained without intravenous contrast. COMPARISON:  CT abdomen pelvis 10/16/2017 CT chest 11/21/2017 CT thoracic and lumbar spine 01/29/2021 FINDINGS: MRI THORACIC  SPINE FINDINGS Alignment:  Normal Vertebrae: Compression fracture of T12 with approximately 50% height loss and moderate edema. 5 mm of retropulsion. T9 hemangioma. High fluid sensitive signal within the T8 vertebral body near the inferior endplate, possibly a Schmorl's node. Cord:  Normal signal and morphology. Paraspinal and other soft tissues: Negative. Disc levels: No spinal canal stenosis. MRI LUMBAR SPINE FINDINGS Segmentation:  Standard Alignment:  Normal Vertebrae: High fluid sensitive signal focus in the L4 vertebral body. L1 compression fracture with less than 25% height loss. Minimal retropulsion. Conus medullaris and cauda equina: Conus extends to the L2 level. Conus and cauda equina appear normal. Paraspinal and other soft tissues: Negative. Disc levels: L1-L2: Normal disc space and facet joints. No spinal canal stenosis. No neural foraminal stenosis. L2-L3: Normal disc space and facet joints. No spinal canal stenosis. No neural foraminal stenosis. L3-L4: Intermediate disc bulge. No spinal canal stenosis. No neural foraminal stenosis. L4-L5: Mild facet hypertrophy with mild left asymmetric disc bulge. No spinal canal stenosis. No neural foraminal stenosis. L5-S1: Normal disc space and facet joints. No spinal canal stenosis. No neural foraminal stenosis. Visualized sacrum: Normal. IMPRESSION: 1. Compression fractures of T12 and L1 with approximately 50% and less than 25% height loss, respectively. Mild retropulsion with no associated stenosis. 2. Indeterminate fluid-sensitive signal hyperintensities in the T8 and L4 vertebrae. I suspect these are Schmorl's nodes based on comparison with prior CTs. 3. No thoracic or lumbar spinal canal or neural foraminal stenosis. Electronically Signed   By: Ulyses Jarred M.D.   On: 01/29/2021 21:34   CT L-SPINE NO CHARGE  Result Date: 01/29/2021 CLINICAL DATA:  Back pain. Additional provided: Patient reports back pain from an old injury. Nausea, symptoms persistent  for 3 days. EXAM: CT LUMBAR SPINE WITHOUT CONTRAST TECHNIQUE: Multidetector CT imaging of the lumbar spine was performed without intravenous contrast administration. Multiplanar CT image reconstructions were also generated. COMPARISON:  CT abdomen/pelvis 01/02/2021. FINDINGS: Segmentation: 5 lumbar vertebrae. The caudal most well-formed intervertebral disc space is designated L5-S1. Alignment: Mild lumbar dextrocurvature. Unchanged mild bony retropulsion at the level of the T12 superior endplate. Minimal bony retropulsion is present at the L1 superior endplate, at site of an interval compression fracture. Trace L4-L5 grade 1 anterolisthesis, unchanged. Vertebrae: New from the prior CT abdomen/pelvis of 01/02/2021, there is a mild L2-1 vertebral compression fracture (10-20% height loss). There has also been subtle progressive interval height loss at site of a previously demonstrated T12 vertebral compression fracture (now with 60% height loss anteriorly). Vertebral body height is otherwise maintained. No fracture is identified elsewhere within the lumbar spine. Redemonstrated partial fusion across the right greater than left sacroiliac joints. Paraspinal and other soft tissues: Aortoiliac atherosclerosis. Sigmoid diverticulosis. Paraspinal soft tissues within normal limits. Disc levels: No more than mild disc degeneration at any level. T11-T12: Unchanged mild bony retropulsion at the level of the T12 superior endplate. Associated disc bulge. Mild relative narrowing of the ventral spinal canal. Mild  left neural foraminal narrowing. T12-L1: Trace bony retropulsion at the level of the L1 superior endplate. Small disc bulge. No significant spinal canal stenosis. Mild/moderate bilateral neural foraminal narrowing. L1-L2: Disc bulge. No appreciable significant spinal canal stenosis or neural foraminal narrowing. L2-L3: Disc bulge. No appreciable significant spinal canal stenosis or neural foraminal narrowing. L3-L4: Disc  bulge. Facet arthrosis/ligamentum flavum hypertrophy. Mild/moderate central canal stenosis with bilateral subarticular narrowing. Mild bilateral neural foraminal narrowing. L4-L5: Trace grade 1 anterolisthesis. Disc uncovering with disc bulge. Facet arthrosis/ligamentum flavum hypertrophy. Mild/moderate central canal stenosis with bilateral subarticular narrowing. Mild bilateral neural foraminal narrowing. L5-S1: No significant disc herniation or stenosis. IMPRESSION: Mild L1 vertebral compression deformity, new as compared to the CT abdomen/pelvis of 01/02/2021 and acute/subacute. Interval progression of height loss at site of a previously demonstrated T12 compression fracture (now with up to 60% height loss). Unchanged mild bony retropulsion at the level of the T12 superior endplate. Lumbar spondylosis, as outlined. Multilevel spinal canal stenosis. Most notably, there is multifactorial apparent mild/moderate central canal stenosis at L3-L4 and L4-L5 with bilateral subarticular narrowing. Sites of mild and mild/moderate neural foraminal narrowing, as detailed. Partial fusion of the sacroiliac joints, bilaterally. Aortic Atherosclerosis (ICD10-I70.0). Sigmoid diverticulosis. Electronically Signed   By: Kellie Simmering DO   On: 01/29/2021 16:21        Scheduled Meds: . amiodarone  400 mg Oral BID   Followed by  . [START ON 02/07/2021] amiodarone  200 mg Oral Daily  . atorvastatin  40 mg Oral q1800  . bisacodyl  10 mg Oral Daily  . calcium-vitamin D  1 tablet Oral BID WC  . docusate sodium  100 mg Oral BID  . feeding supplement  237 mL Oral BID BM  . tamsulosin  0.4 mg Oral Daily   Continuous Infusions: . [START ON 02/02/2021]  ceFAZolin (ANCEF) IV    . heparin 1,150 Units/hr (01/30/21 1939)     LOS: 1 day      Phillips Climes, MD [facility]   To contact the attending provider between 7A-7P or the covering provider during after hours 7P-7A, please log into the web site  [email or web address] and access using universal  password for that web site. If you do not have the password, please call the hospital operator.  01/31/2021, 11:19 AM

## 2021-01-31 NOTE — Progress Notes (Signed)
[place] for heparin Indication: atrial fibrillation  Allergies  Allergen Reactions  . Amlodipine Other (See Comments)    LE edema  . Azithromycin Nausea And Vomiting  . Lisinopril Cough    Patient Measurements: Height: 5\' 9"  (175.3 cm) Weight: 82.9 kg (182 lb 12.2 oz) IBW/kg (Calculated) : 70.7 Heparin Dosing Weight: 82.9 kg   Vital Signs: Temp: 97.8 F (36.6 C) (05/21 0824) Temp Source: Oral (05/21 0824) BP: 143/91 (05/21 0824) Pulse Rate: 83 (05/21 0824)  Labs: Recent Labs    01/29/21 1425 01/30/21 0211 01/31/21 0138 01/31/21 1146  HGB 11.1* 11.4* 11.6*  --   HCT 34.2* 34.7* 35.5*  --   PLT 219 215 242  --   LABPROT  --   --  15.3*  --   INR  --   --  1.2  --   HEPARINUNFRC  --   --  0.41 0.40  CREATININE 1.66* 1.58* 1.68*  --     Estimated Creatinine Clearance: 32.1 mL/min (A) (by C-G formula based on SCr of 1.68 mg/dL (H)).  Assessment: 87 y.o. male with Afib for heparin.   Heparin level remains therapeutic at 0.40 on rate at 1150 units/h. H/H stable, plt wnl. No bleeding or issues with infusion per RN.   Goal of Therapy:  Heparin level 0.3-0.7 units/ml Monitor platelets by anticoagulation protocol: Yes   Plan:   Continue IV heparin at 1150 units/hr Daily heparin level, CBC Monitor s/s bleeding  Rebbeca Paul, PharmD PGY1 Pharmacy Resident 01/31/2021 1:05 PM  Please check [email or web address] for unit-specific pharmacy phone numbers.

## 2021-01-31 NOTE — Progress Notes (Signed)
Cardiology Progress Note  Patient ID: Jack [name] MRN: [id number] DOB: January 08, 1933 Date of Encounter: 01/31/2021  Primary Cardiologist: None  Subjective   Chief Complaint: None.  HPI: No further A. fib.  Remains on diltiazem drip.  Denies any symptoms.  ROS:  All other ROS reviewed and negative. Pertinent positives noted in the HPI.     Inpatient Medications  Scheduled Meds: . amiodarone  400 mg Oral BID   Followed by  . [START ON 02/07/2021] amiodarone  200 mg Oral Daily  . atorvastatin  40 mg Oral q1800  . bisacodyl  10 mg Oral Daily  . calcium-vitamin D  1 tablet Oral BID WC  . docusate sodium  100 mg Oral BID  . feeding supplement  237 mL Oral BID BM  . potassium chloride  20 mEq Oral Once  . tamsulosin  0.4 mg Oral Daily   Continuous Infusions: . [START ON 02/02/2021]  ceFAZolin (ANCEF) IV    . heparin 1,150 Units/hr (01/30/21 1939)   PRN Meds: hydrALAZINE, morphine injection, ondansetron (ZOFRAN) IV, traMADol   Vital Signs   Vitals:   01/30/21 2031 01/30/21 2103 01/31/21 0518 01/31/21 0824  BP:  (!) 110/91 127/75 (!) 143/91  Pulse: (!) 109 100 79 83  Resp: (!) 23 18 16 16   Temp:   97.8 F (36.6 C) 97.8 F (36.6 C)  TempSrc:   Oral Oral  SpO2: 94% 95% 97% 98%  Weight:      Height:        Intake/Output Summary (Last 24 hours) at 01/31/2021 0855 Last data filed at 01/31/2021 5009 Gross per 24 hour  Intake 0 ml  Output 250 ml  Net -250 ml   Last 3 Weights 01/30/2021 01/02/2021 12/25/2020  Weight (lbs) 182 lb 12.2 oz 195 lb 200 lb 9.6 oz  Weight (kg) 82.9 kg 88.451 kg 90.992 kg      Telemetry  Overnight telemetry shows normal sinus rhythm heart rate in the 80s, which I personally reviewed.   ECG  The most recent ECG shows sinus rhythm heart rate 80, left anterior fascicular block, which I personally reviewed.   Physical Exam   Vitals:   01/30/21 2031 01/30/21 2103 01/31/21 0518 01/31/21 0824  BP:  (!) 110/91 127/75 (!) 143/91  Pulse: (!) 109  100 79 83  Resp: (!) 23 18 16 16   Temp:   97.8 F (36.6 C) 97.8 F (36.6 C)  TempSrc:   Oral Oral  SpO2: 94% 95% 97% 98%  Weight:      Height:         Intake/Output Summary (Last 24 hours) at 01/31/2021 0855 Last data filed at 01/31/2021 0626 Gross per 24 hour  Intake 0 ml  Output 250 ml  Net -250 ml    Last 3 Weights 01/30/2021 01/02/2021 12/25/2020  Weight (lbs) 182 lb 12.2 oz 195 lb 200 lb 9.6 oz  Weight (kg) 82.9 kg 88.451 kg 90.992 kg    Body mass index is 26.99 kg/m.  General: Well nourished, well developed, in no acute distress Head: Atraumatic, normal size  Eyes: PEERLA, EOMI  Neck: Supple, no JVD Endocrine: No thryomegaly Cardiac: Normal S1, S2; RRR; no murmurs, rubs, or gallops Lungs: Clear to auscultation bilaterally, no wheezing, rhonchi or rales  Abd: Soft, nontender, no hepatomegaly  Ext: No edema, pulses 2+ Musculoskeletal: No deformities, BUE and BLE strength normal and equal Skin: Warm and dry, no rashes   Neuro: Alert and oriented to person, place, time, and  situation, CNII-XII grossly intact, no focal deficits  Psych: Normal mood and affect   Labs  High Sensitivity Troponin:  No results for input(s): TROPONINIHS in the last 720 hours.   Cardiac EnzymesNo results for input(s): TROPONINI in the last 168 hours. No results for input(s): TROPIPOC in the last 168 hours.  Chemistry Recent Labs  Lab 01/29/21 1425 01/30/21 0211 01/31/21 0138  NA 138 136 139  K 3.4* 3.2* 3.8  CL 104 104 102  CO2 24 25 26   GLUCOSE 106* 111* 127*  BUN 21 19 22   CREATININE 1.66* 1.58* 1.68*  CALCIUM 9.6 9.5 10.3  PROT 6.1*  --   --   ALBUMIN 3.5  --   --   AST 17  --   --   ALT 11  --   --   ALKPHOS 104  --   --   BILITOT 0.8  --   --   GFRNONAA 40* 43* 40*  ANIONGAP 10 7 11     Hematology Recent Labs  Lab 01/29/21 1425 01/30/21 0211 01/31/21 0138  WBC 4.1 4.0 5.1  RBC 3.65* 3.75* 3.87*  HGB 11.1* 11.4* 11.6*  HCT 34.2* 34.7* 35.5*  MCV 93.7 92.5 91.7  MCH  30.4 30.4 30.0  MCHC 32.5 32.9 32.7  RDW 14.6 14.5 14.8  PLT 219 215 242   BNPNo results for input(s): BNP, PROBNP in the last 168 hours.  DDimer No results for input(s): DDIMER in the last 168 hours.   Radiology  CT ABDOMEN PELVIS WO CONTRAST  Result Date: 01/29/2021 CLINICAL DATA:  Acute generalized abdominal pain and distention. EXAM: CT ABDOMEN AND PELVIS WITHOUT CONTRAST TECHNIQUE: Multidetector CT imaging of the abdomen and pelvis was performed following the standard protocol without IV contrast. COMPARISON:  January 02, 2021. FINDINGS: Lower chest: Moderate size sliding-type hiatal hernia is noted. Visualized lung bases are unremarkable. Hepatobiliary: No focal liver abnormality is seen. No gallstones, gallbladder wall thickening, or biliary dilatation. Pancreas: Unremarkable. No pancreatic ductal dilatation or surrounding inflammatory changes. Spleen: Normal in size without focal abnormality. Adrenals/Urinary Tract: Adrenal glands are unremarkable. Kidneys are normal, without renal calculi, focal lesion, or hydronephrosis. Bladder is unremarkable. Stomach/Bowel: Stomach is within normal limits. Appendix appears normal. No evidence of bowel wall thickening, distention, or inflammatory changes. Vascular/Lymphatic: Aortic atherosclerosis. No enlarged abdominal or pelvic lymph nodes. Reproductive: Stable moderate prostatic enlargement is noted. Other: No abdominal wall hernia or abnormality. No abdominopelvic ascites. Musculoskeletal: Stable subacute to old T12 compression fracture. IMPRESSION: Moderate size sliding-type hiatal hernia. Stable moderate prostatic enlargement. No acute abnormality seen in the abdomen or pelvis. Aortic Atherosclerosis (ICD10-I70.0). Electronically Signed   By: Marijo Conception M.D.   On: 01/29/2021 16:05   CT Thoracic Spine Wo Contrast  Result Date: 01/29/2021 CLINICAL DATA:  Mid back pain; mid back pain, history of T12 fracture, evaluate for additional trauma.  Additional provided: Patient reports back pain from old injury, nausea, symptoms persist in for 3 days. EXAM: CT THORACIC SPINE WITHOUT CONTRAST TECHNIQUE: Multidetector CT images of the thoracic were obtained using the standard protocol without intravenous contrast. COMPARISON:  CT abdomen/pelvis 01/02/2021. FINDINGS: Alignment: No significant spondylolisthesis. Unchanged mild bony retropulsion at the level of the T12 superior endplate. Vertebrae: Mild interval progression of height loss at site of a previously demonstrated T12 compression fracture (now 60%). Vertebral body height is otherwise maintained within the thoracic spine. No evidence of fracture elsewhere within the thoracic spine. T9 vertebral body hemangioma. Multilevel ventrolateral osteophytes. Paraspinal and other  soft tissues: Linear atelectasis within the imaged lung bases. Hiatal hernia. Aortoiliac atherosclerosis. Disc levels: Multilevel disc space narrowing. Most notably, there is moderate to moderately advanced disc space narrowing at the T7-T8 through T9-T12 levels. At T11-T12, there is mild bony retropulsion at the level of the T12 superior endplate. Associated disc bulge. Mild relative narrowing of the ventral spinal canal. Mild bilateral neural foraminal narrowing. No appreciable significant disc herniation or spinal canal stenosis at the remaining thoracic levels. No compressive foraminal narrowing. IMPRESSION: Mild interval progression of a previously demonstrated T12 compression fracture (now with 60% height loss). Unchanged mild bony retropulsion at the level of the T12 superior endplate. Associated disc bulge at T11-T12. Resultant mild relative narrowing of the spinal canal at this level. Mild bilateral T11-T12 neural foraminal narrowing. No other thoracic spine fracture is identified. No significant disc herniation, spinal canal stenosis or compressive neural foraminal narrowing at the remaining thoracic levels. Linear atelectasis  within the imaged lung bases. Hiatal hernia. Aortic Atherosclerosis (ICD10-I70.0). Electronically Signed   By: Kellie Simmering DO   On: 01/29/2021 16:30   MR THORACIC SPINE WO CONTRAST  Result Date: 01/29/2021 CLINICAL DATA:  Mid back pain with compression fracture. EXAM: MRI THORACIC AND LUMBAR SPINE WITHOUT CONTRAST TECHNIQUE: Multiplanar and multiecho pulse sequences of the thoracic and lumbar spine were obtained without intravenous contrast. COMPARISON:  CT abdomen pelvis 10/16/2017 CT chest 11/21/2017 CT thoracic and lumbar spine 01/29/2021 FINDINGS: MRI THORACIC SPINE FINDINGS Alignment:  Normal Vertebrae: Compression fracture of T12 with approximately 50% height loss and moderate edema. 5 mm of retropulsion. T9 hemangioma. High fluid sensitive signal within the T8 vertebral body near the inferior endplate, possibly a Schmorl's node. Cord:  Normal signal and morphology. Paraspinal and other soft tissues: Negative. Disc levels: No spinal canal stenosis. MRI LUMBAR SPINE FINDINGS Segmentation:  Standard Alignment:  Normal Vertebrae: High fluid sensitive signal focus in the L4 vertebral body. L1 compression fracture with less than 25% height loss. Minimal retropulsion. Conus medullaris and cauda equina: Conus extends to the L2 level. Conus and cauda equina appear normal. Paraspinal and other soft tissues: Negative. Disc levels: L1-L2: Normal disc space and facet joints. No spinal canal stenosis. No neural foraminal stenosis. L2-L3: Normal disc space and facet joints. No spinal canal stenosis. No neural foraminal stenosis. L3-L4: Intermediate disc bulge. No spinal canal stenosis. No neural foraminal stenosis. L4-L5: Mild facet hypertrophy with mild left asymmetric disc bulge. No spinal canal stenosis. No neural foraminal stenosis. L5-S1: Normal disc space and facet joints. No spinal canal stenosis. No neural foraminal stenosis. Visualized sacrum: Normal. IMPRESSION: 1. Compression fractures of T12 and L1 with  approximately 50% and less than 25% height loss, respectively. Mild retropulsion with no associated stenosis. 2. Indeterminate fluid-sensitive signal hyperintensities in the T8 and L4 vertebrae. I suspect these are Schmorl's nodes based on comparison with prior CTs. 3. No thoracic or lumbar spinal canal or neural foraminal stenosis. Electronically Signed   By: Ulyses Jarred M.D.   On: 01/29/2021 21:34   MR LUMBAR SPINE WO CONTRAST  Result Date: 01/29/2021 CLINICAL DATA:  Mid back pain with compression fracture. EXAM: MRI THORACIC AND LUMBAR SPINE WITHOUT CONTRAST TECHNIQUE: Multiplanar and multiecho pulse sequences of the thoracic and lumbar spine were obtained without intravenous contrast. COMPARISON:  CT abdomen pelvis 10/16/2017 CT chest 11/21/2017 CT thoracic and lumbar spine 01/29/2021 FINDINGS: MRI THORACIC SPINE FINDINGS Alignment:  Normal Vertebrae: Compression fracture of T12 with approximately 50% height loss and moderate edema. 5 mm of  retropulsion. T9 hemangioma. High fluid sensitive signal within the T8 vertebral body near the inferior endplate, possibly a Schmorl's node. Cord:  Normal signal and morphology. Paraspinal and other soft tissues: Negative. Disc levels: No spinal canal stenosis. MRI LUMBAR SPINE FINDINGS Segmentation:  Standard Alignment:  Normal Vertebrae: High fluid sensitive signal focus in the L4 vertebral body. L1 compression fracture with less than 25% height loss. Minimal retropulsion. Conus medullaris and cauda equina: Conus extends to the L2 level. Conus and cauda equina appear normal. Paraspinal and other soft tissues: Negative. Disc levels: L1-L2: Normal disc space and facet joints. No spinal canal stenosis. No neural foraminal stenosis. L2-L3: Normal disc space and facet joints. No spinal canal stenosis. No neural foraminal stenosis. L3-L4: Intermediate disc bulge. No spinal canal stenosis. No neural foraminal stenosis. L4-L5: Mild facet hypertrophy with mild left asymmetric  disc bulge. No spinal canal stenosis. No neural foraminal stenosis. L5-S1: Normal disc space and facet joints. No spinal canal stenosis. No neural foraminal stenosis. Visualized sacrum: Normal. IMPRESSION: 1. Compression fractures of T12 and L1 with approximately 50% and less than 25% height loss, respectively. Mild retropulsion with no associated stenosis. 2. Indeterminate fluid-sensitive signal hyperintensities in the T8 and L4 vertebrae. I suspect these are Schmorl's nodes based on comparison with prior CTs. 3. No thoracic or lumbar spinal canal or neural foraminal stenosis. Electronically Signed   By: Ulyses Jarred M.D.   On: 01/29/2021 21:34   CT L-SPINE NO CHARGE  Result Date: 01/29/2021 CLINICAL DATA:  Back pain. Additional provided: Patient reports back pain from an old injury. Nausea, symptoms persistent for 3 days. EXAM: CT LUMBAR SPINE WITHOUT CONTRAST TECHNIQUE: Multidetector CT imaging of the lumbar spine was performed without intravenous contrast administration. Multiplanar CT image reconstructions were also generated. COMPARISON:  CT abdomen/pelvis 01/02/2021. FINDINGS: Segmentation: 5 lumbar vertebrae. The caudal most well-formed intervertebral disc space is designated L5-S1. Alignment: Mild lumbar dextrocurvature. Unchanged mild bony retropulsion at the level of the T12 superior endplate. Minimal bony retropulsion is present at the L1 superior endplate, at site of an interval compression fracture. Trace L4-L5 grade 1 anterolisthesis, unchanged. Vertebrae: New from the prior CT abdomen/pelvis of 01/02/2021, there is a mild L2-1 vertebral compression fracture (10-20% height loss). There has also been subtle progressive interval height loss at site of a previously demonstrated T12 vertebral compression fracture (now with 60% height loss anteriorly). Vertebral body height is otherwise maintained. No fracture is identified elsewhere within the lumbar spine. Redemonstrated partial fusion across the  right greater than left sacroiliac joints. Paraspinal and other soft tissues: Aortoiliac atherosclerosis. Sigmoid diverticulosis. Paraspinal soft tissues within normal limits. Disc levels: No more than mild disc degeneration at any level. T11-T12: Unchanged mild bony retropulsion at the level of the T12 superior endplate. Associated disc bulge. Mild relative narrowing of the ventral spinal canal. Mild left neural foraminal narrowing. T12-L1: Trace bony retropulsion at the level of the L1 superior endplate. Small disc bulge. No significant spinal canal stenosis. Mild/moderate bilateral neural foraminal narrowing. L1-L2: Disc bulge. No appreciable significant spinal canal stenosis or neural foraminal narrowing. L2-L3: Disc bulge. No appreciable significant spinal canal stenosis or neural foraminal narrowing. L3-L4: Disc bulge. Facet arthrosis/ligamentum flavum hypertrophy. Mild/moderate central canal stenosis with bilateral subarticular narrowing. Mild bilateral neural foraminal narrowing. L4-L5: Trace grade 1 anterolisthesis. Disc uncovering with disc bulge. Facet arthrosis/ligamentum flavum hypertrophy. Mild/moderate central canal stenosis with bilateral subarticular narrowing. Mild bilateral neural foraminal narrowing. L5-S1: No significant disc herniation or stenosis. IMPRESSION: Mild L1 vertebral compression  deformity, new as compared to the CT abdomen/pelvis of 01/02/2021 and acute/subacute. Interval progression of height loss at site of a previously demonstrated T12 compression fracture (now with up to 60% height loss). Unchanged mild bony retropulsion at the level of the T12 superior endplate. Lumbar spondylosis, as outlined. Multilevel spinal canal stenosis. Most notably, there is multifactorial apparent mild/moderate central canal stenosis at L3-L4 and L4-L5 with bilateral subarticular narrowing. Sites of mild and mild/moderate neural foraminal narrowing, as detailed. Partial fusion of the sacroiliac joints,  bilaterally. Aortic Atherosclerosis (ICD10-I70.0). Sigmoid diverticulosis. Electronically Signed   By: Kellie Simmering DO   On: 01/29/2021 16:21    Cardiac Studies  TTE 01/07/2021 1. Left ventricular ejection fraction, by estimation, is 50 to 55%. The  left ventricle has low normal function. The left ventricle has no regional  wall motion abnormalities. There is mild left ventricular hypertrophy.  Left ventricular diastolic  parameters are consistent with Grade I diastolic dysfunction (impaired  relaxation).  2. Right ventricular systolic function is normal. The right ventricular  size is normal.  3. The mitral valve is normal in structure. No evidence of mitral valve  regurgitation.  4. The aortic valve is tricuspid. Aortic valve regurgitation is mild.  5. Aortic dilatation noted. There is moderate dilatation of the ascending  aorta, measuring 48 mm. There is mild dilatation of the aortic root,  measuring 40 mm.  6. The inferior vena cava is normal in size with greater than 50%  respiratory variability, suggesting right atrial pressure of 3 mmHg.   Patient Profile  Jack [name] is a 87 y.o. male with coronary calcifications, hyperlipidemia, hypertension, CKD stage III who was admitted on 01/30/2020 with a new L1 compression fracture.  He has a known T12 compression fracture that was managed medically.  Surgery plans to manage this medically.  Cardiology was consulted for A. fib with RVR on 01/30/2021.  Assessment & Plan   1.  New onset A. Fib -Admitted with a new L1 compression fracture that will undergo surgery.  In the ER he had spontaneous A. fib that converted back to sinus rhythm without medications. -He was placed on diltiazem drip. -I have stopped his diltiazem. -He did have soft blood pressure when he was in A. Fib. -We will put him on a short course of amiodarone to get him through this.  His A. fib is hyperadrenergic.  We will start him on 400 mg twice daily for 7 days  and then 200 mg daily for 21 days.  This will not be a long-term medication for him. -He is on a heparin drip.  Plan to transition to Eliquis after surgery.  If he has high bleeding risk would recommend to not bridge him.  We can continue heparin for now as he is having no issues. -Echocardiogram in April was normal.  He has no evidence of heart failure.  He denies any chest pain.  His EKG shows no acute ischemic changes.  Thyroid studies are normal.  Suspect his A. fib is related to pain and ill state.  For questions or updates, please contact [facility] Please consult [email or web address] for contact info under   Time Spent with Patient: I have spent a total of 25 minutes with patient reviewing hospital notes, telemetry, EKGs, labs and examining the patient as well as establishing an assessment and plan that was discussed with the patient.  > 50% of time was spent in direct patient care.    Signed, Addison Naegeli. Audie Box, MD,  [place]  01/31/2021 8:55 AM

## 2021-02-01 ENCOUNTER — Inpatient Hospital Stay (HOSPITAL_COMMUNITY): Payer: PPO

## 2021-02-01 DIAGNOSIS — R110 Nausea: Secondary | ICD-10-CM | POA: Diagnosis not present

## 2021-02-01 DIAGNOSIS — S22080D Wedge compression fracture of T11-T12 vertebra, subsequent encounter for fracture with routine healing: Secondary | ICD-10-CM | POA: Diagnosis not present

## 2021-02-01 DIAGNOSIS — E43 Unspecified severe protein-calorie malnutrition: Secondary | ICD-10-CM | POA: Diagnosis not present

## 2021-02-01 DIAGNOSIS — I480 Paroxysmal atrial fibrillation: Secondary | ICD-10-CM | POA: Diagnosis not present

## 2021-02-01 LAB — BASIC METABOLIC PANEL
Anion gap: 9 (ref 5–15)
BUN: 27 mg/dL — ABNORMAL HIGH (ref 8–23)
CO2: 28 mmol/L (ref 22–32)
Calcium: 10.3 mg/dL (ref 8.9–10.3)
Chloride: 102 mmol/L (ref 98–111)
Creatinine, Ser: 1.79 mg/dL — ABNORMAL HIGH (ref 0.61–1.24)
GFR, Estimated: 37 mL/min — ABNORMAL LOW (ref >60)
Glucose, Bld: 115 mg/dL — ABNORMAL HIGH (ref 70–99)
Potassium: 4 mmol/L (ref 3.5–5.1)
Sodium: 139 mmol/L (ref 135–145)

## 2021-02-01 LAB — CBC
HCT: 35.1 % — ABNORMAL LOW (ref 39.0–52.0)
Hemoglobin: 11.4 g/dL — ABNORMAL LOW (ref 13.0–17.0)
MCH: 30.3 pg (ref 26.0–34.0)
MCHC: 32.5 g/dL (ref 30.0–36.0)
MCV: 93.4 fL (ref 80.0–100.0)
Platelets: 232 10*3/uL (ref 150–400)
RBC: 3.76 MIL/uL — ABNORMAL LOW (ref 4.22–5.81)
RDW: 14.8 % (ref 11.5–15.5)
WBC: 5.3 10*3/uL (ref 4.0–10.5)
nRBC: 0 % (ref 0.0–0.2)

## 2021-02-01 LAB — HEPARIN LEVEL (UNFRACTIONATED): Heparin Unfractionated: 0.37 IU/mL (ref 0.30–0.70)

## 2021-02-01 MED ORDER — POLYETHYLENE GLYCOL 3350 17 G PO PACK
17.0000 g | PACK | Freq: Two times a day (BID) | ORAL | Status: AC
  Administered 2021-02-01: 17 g via ORAL
  Filled 2021-02-01: qty 1

## 2021-02-01 NOTE — Progress Notes (Signed)
[place] for heparin Indication: atrial fibrillation  Allergies  Allergen Reactions  . Amlodipine Other (See Comments)    LE edema  . Azithromycin Nausea And Vomiting  . Lisinopril Cough    Patient Measurements: Height: 5\' 9"  (175.3 cm) Weight: 82.9 kg (182 lb 12.2 oz) IBW/kg (Calculated) : 70.7 Heparin Dosing Weight: 82.9 kg   Vital Signs: Temp: 97.6 F (36.4 C) (05/22 0500) Temp Source: Oral (05/22 0500) BP: 158/99 (05/22 0500) Pulse Rate: 79 (05/22 0500)  Labs: Recent Labs    01/30/21 0211 01/31/21 0138 01/31/21 1146 02/01/21 0228  HGB 11.4* 11.6*  --  11.4*  HCT 34.7* 35.5*  --  35.1*  PLT 215 242  --  232  LABPROT  --  15.3*  --   --   INR  --  1.2  --   --   HEPARINUNFRC  --  0.41 0.40 0.37  CREATININE 1.58* 1.68*  --  1.79*    Estimated Creatinine Clearance: 30.2 mL/min (A) (by C-G formula based on SCr of 1.79 mg/dL (H)).  Assessment: 87 y.o. male with Afib for heparin.   Heparin level remains therapeutic at 0.37 on rate at 1150 units/h. H/H stable, plt wnl. No bleeding or issues with infusion per RN.   Goal of Therapy:  Heparin level 0.3-0.7 units/ml Monitor platelets by anticoagulation protocol: Yes   Plan:   Continue IV heparin at 1150 units/hr Daily heparin level, CBC  Rebbeca Paul, PharmD PGY1 Pharmacy Resident 02/01/2021 7:22 AM  Please check [email or web address] for unit-specific pharmacy phone numbers.

## 2021-02-01 NOTE — Progress Notes (Signed)
PROGRESS NOTE    Jack [name]  CZY:[id number] DOB: 1933-07-08 DOA: 01/29/2021 PCP: [name], MD  Chief Complaint  Patient presents with  . Back Pain  . Nausea    Brief Narrative:     Jack [name] is a 87 y.o. male with history of hypertension, anemia, chronic kidney disease stage III who was recently admitted at [facility] for T12 compression fracture subsequently discharged to rehab was in rehab for 2 weeks discharged last week from rehab was doing well with the TLSO brace started having worsening pain over the last 3 days mostly in the low back with no radiation or any associated incontinence of urine or bowel.  Denies any fever chills.  Given the worsening pain patient came to the ER. MRI done which shows T12 and new L1 compression fracture , seen by neurosurgery, neuro interventional radiology, recommendation for kyphoplasty, patient developed A. fib with RVR 5/20.  Subjective:  Still complaining of back pain, he had 1 bowel movement yesterday with laxatives, still reports nausea, appetite is poor, but he is drinking all of his Ensure.  Assessment & Plan:   Principal Problem:   T12 compression fracture (HCC) Active Problems:   Protein-calorie malnutrition, severe   T12 and new L1 compression fracture for which neurosurgery has been consulted.  -  Recommendation for kyphoplasty, I have discussed with his neurosurgeon group at [facility], and he wishes to proceed with the procedure here, so interventional radiology were consulted, plan for kyphoplasty on Monday. -Cussed with neuro interventional, hold heparin GTT 2 hours before procedure. -Continue with as needed meds  Hypertension  - Patient used to be on ARB and hydrochlorothiazide which was recently discontinued due to worsening renal function. -Blood pressure better improved after increasing hydralazine, continue with as needed hydralazine, hydralazine has been stopped given soft  blood pressure with Cardizem drip.  A. fib with RVR -Required Cardizem drip initially, converted to normal sinus rhythm, cardiology input input greatly appreciated, currently on amiodarone. -He is on IV heparin drip currently , can do Eliquis after procedure.  Hyperlipidemia  -on statins.  Anemia likely from renal disease.  - Appears to be baseline.  Hypokalemia -Repleted  CKD stage IIIb -Avoid nephrotoxic medications  Severe protein calorie malnutrition -Nutrition is consulted, continue with supplement  Nausea -UB with no acute finding, this is most likely due to pain medicine    DVT prophylaxis:  On heparin GTT  Code Status: Full Family Communication: D/W wife by phone daily Disposition:   Status is: Inpatient  Remains inpatient appropriate because:IV treatments appropriate due to intensity of illness or inability to take PO   Dispo: The patient is from: SNF              Anticipated d/c is to: SNF              Patient currently is not medically stable to d/c.   Difficult to place patient No       Consultants:   Interventional radiology  Cardiology     Objective: Vitals:   01/31/21 1455 01/31/21 2000 02/01/21 0500 02/01/21 1507  BP: 135/86 129/80 (!) 158/99 (!) 133/96  Pulse: 92 95 79 100  Resp: 17 15 16 20   Temp:  97.8 F (36.6 C) 97.6 F (36.4 C) 98.2 F (36.8 C)  TempSrc: Oral Oral Oral Oral  SpO2: 95% 100% 99% 98%  Weight:      Height:        Intake/Output Summary (  Last 24 hours) at 02/01/2021 1543 Last data filed at 02/01/2021 0500 Gross per 24 hour  Intake --  Output 400 ml  Net -400 ml   Filed Weights   01/30/21 0529  Weight: 82.9 kg    Examination:  Awake Alert, Oriented X 3, frail, weak, No new F.N deficits, Normal affect Symmetrical Chest wall movement, Good air movement bilaterally, CTAB RRR,No Gallops,Rubs or new Murmurs, No Parasternal Heave +ve B.Sounds, Abd Soft, No tenderness, No rebound - guarding or rigidity. No  Cyanosis, Clubbing or edema, No new Rash or bruise       Data Reviewed: I have personally reviewed following labs and imaging studies  CBC: Recent Labs  Lab 01/29/21 1425 01/30/21 0211 01/31/21 0138 02/01/21 0228  WBC 4.1 4.0 5.1 5.3  NEUTROABS 3.0  --   --   --   HGB 11.1* 11.4* 11.6* 11.4*  HCT 34.2* 34.7* 35.5* 35.1*  MCV 93.7 92.5 91.7 93.4  PLT 219 215 242 676    Basic Metabolic Panel: Recent Labs  Lab 01/29/21 1425 01/30/21 0211 01/31/21 0138 02/01/21 0228  NA 138 136 139 139  K 3.4* 3.2* 3.8 4.0  CL 104 104 102 102  CO2 24 25 26 28   GLUCOSE 106* 111* 127* 115*  BUN 21 19 22  27*  CREATININE 1.66* 1.58* 1.68* 1.79*  CALCIUM 9.6 9.5 10.3 10.3  MG  --   --  2.4  --     GFR: Estimated Creatinine Clearance: 30.2 mL/min (A) (by C-G formula based on SCr of 1.79 mg/dL (H)).  Liver Function Tests: Recent Labs  Lab 01/29/21 1425  AST 17  ALT 11  ALKPHOS 104  BILITOT 0.8  PROT 6.1*  ALBUMIN 3.5    CBG: No results for input(s): GLUCAP in the last 168 hours.   Recent Results (from the past 240 hour(s))  Surgical pcr screen     Status: None   Collection Time: 01/30/21  5:20 AM   Specimen: Nasal Mucosa; Nasal Swab  Result Value Ref Range Status   MRSA, PCR NEGATIVE NEGATIVE Final   Staphylococcus aureus NEGATIVE NEGATIVE Final    Comment: (NOTE) The Xpert SA Assay (FDA approved for NASAL specimens in patients 69 years of age and older), is one component of a comprehensive surveillance program. It is not intended to diagnose infection nor to guide or monitor treatment. Performed at [facility], [place] [address] [place], [place] [address]          Radiology Studies: DG Abd 1 View  Result Date: 02/01/2021 CLINICAL DATA:  Nausea and vomiting. EXAM: ABDOMEN - 1 VIEW COMPARISON:  December 25, 2020 FINDINGS: Hernia mesh projects over the pelvis. Degenerative changes in the lumbar spine. No other bony abnormalities. No free air, portal venous gas,  or pneumatosis. No bowel obstruction. IMPRESSION: No acute abnormalities identified. Electronically Signed   By: Dorise Bullion III M.D   On: 02/01/2021 10:25        Scheduled Meds: . amiodarone  400 mg Oral BID   Followed by  . [START ON 02/07/2021] amiodarone  200 mg Oral Daily  . atorvastatin  40 mg Oral q1800  . calcium-vitamin D  1 tablet Oral BID WC  . docusate sodium  100 mg Oral BID  . feeding supplement  237 mL Oral TID BM  . tamsulosin  0.4 mg Oral Daily   Continuous Infusions: . [START ON 02/02/2021]  ceFAZolin (ANCEF) IV    . heparin 1,150 Units/hr (02/01/21  1344)     LOS: 2 days      Phillips Climes, MD [facility]   To contact the attending provider between 7A-7P or the covering provider during after hours 7P-7A, please log into the web site [email or web address] and access using universal [place] password for that web site. If you do not have the password, please call the hospital operator.  02/01/2021, 3:43 PM

## 2021-02-01 NOTE — Progress Notes (Signed)
Cardiology Progress Note  Patient ID: Jack [name] MRN: [id number] DOB: [birth year] Date of Encounter: 02/01/2021  Primary Cardiologist: None  Subjective   Chief Complaint: None.  HPI: Maintaining sinus rhythm.  Denies symptoms.  ROS:  All other ROS reviewed and negative. Pertinent positives noted in the HPI.     Inpatient Medications  Scheduled Meds: . amiodarone  400 mg Oral BID   Followed by  . [START ON 02/07/2021] amiodarone  200 mg Oral Daily  . atorvastatin  40 mg Oral q1800  . calcium-vitamin D  1 tablet Oral BID WC  . docusate sodium  100 mg Oral BID  . feeding supplement  237 mL Oral TID BM  . tamsulosin  0.4 mg Oral Daily   Continuous Infusions: . [START ON 02/02/2021]  ceFAZolin (ANCEF) IV    . heparin 1,150 Units/hr (01/31/21 1405)   PRN Meds: hydrALAZINE, morphine injection, ondansetron (ZOFRAN) IV, traMADol   Vital Signs   Vitals:   01/31/21 0824 01/31/21 1455 01/31/21 2000 02/01/21 0500  BP: (!) 143/91 135/86 129/80 (!) 158/99  Pulse: 83 92 95 79  Resp: 16 17 15 16   Temp: 97.8 F (36.6 C)  97.8 F (36.6 C) 97.6 F (36.4 C)  TempSrc: Oral Oral Oral Oral  SpO2: 98% 95% 100% 99%  Weight:      Height:        Intake/Output Summary (Last 24 hours) at 02/01/2021 1039 Last data filed at 02/01/2021 0500 Gross per 24 hour  Intake --  Output 400 ml  Net -400 ml   Last 3 Weights 01/30/2021 01/02/2021 12/25/2020  Weight (lbs) 182 lb 12.2 oz 195 lb 200 lb 9.6 oz  Weight (kg) 82.9 kg 88.451 kg 90.992 kg      Telemetry  Overnight telemetry shows normal sinus rhythm heart rate in the 90s, which I personally reviewed.   Physical Exam   Vitals:   01/31/21 0824 01/31/21 1455 01/31/21 2000 02/01/21 0500  BP: (!) 143/91 135/86 129/80 (!) 158/99  Pulse: 83 92 95 79  Resp: 16 17 15 16   Temp: 97.8 F (36.6 C)  97.8 F (36.6 C) 97.6 F (36.4 C)  TempSrc: Oral Oral Oral Oral  SpO2: 98% 95% 100% 99%  Weight:      Height:         Intake/Output Summary  (Last 24 hours) at 02/01/2021 1039 Last data filed at 02/01/2021 0500 Gross per 24 hour  Intake --  Output 400 ml  Net -400 ml    Last 3 Weights 01/30/2021 01/02/2021 12/25/2020  Weight (lbs) 182 lb 12.2 oz 195 lb 200 lb 9.6 oz  Weight (kg) 82.9 kg 88.451 kg 90.992 kg    Body mass index is 26.99 kg/m.  General: Well nourished, well developed, in no acute distress Head: Atraumatic, normal size  Eyes: PEERLA, EOMI  Neck: Supple, no JVD Endocrine: No thryomegaly Cardiac: Normal S1, S2; RRR; no murmurs, rubs, or gallops Lungs: Clear to auscultation bilaterally, no wheezing, rhonchi or rales  Abd: Soft, nontender, no hepatomegaly  Ext: No edema, pulses 2+ Musculoskeletal: No deformities, BUE and BLE strength normal and equal Skin: Warm and dry, no rashes   Neuro: Alert and oriented to person, place, time, and situation, CNII-XII grossly intact, no focal deficits  Psych: Normal mood and affect   Labs  High Sensitivity Troponin:  No results for input(s): TROPONINIHS in the last 720 hours.   Cardiac EnzymesNo results for input(s): TROPONINI in the last 168 hours. No results  for input(s): TROPIPOC in the last 168 hours.  Chemistry Recent Labs  Lab 01/29/21 1425 01/30/21 0211 01/31/21 0138 02/01/21 0228  NA 138 136 139 139  K 3.4* 3.2* 3.8 4.0  CL 104 104 102 102  CO2 24 25 26 28   GLUCOSE 106* 111* 127* 115*  BUN 21 19 22  27*  CREATININE 1.66* 1.58* 1.68* 1.79*  CALCIUM 9.6 9.5 10.3 10.3  PROT 6.1*  --   --   --   ALBUMIN 3.5  --   --   --   AST 17  --   --   --   ALT 11  --   --   --   ALKPHOS 104  --   --   --   BILITOT 0.8  --   --   --   GFRNONAA 40* 43* 40* 37*  ANIONGAP 10 7 11 9     Hematology Recent Labs  Lab 01/30/21 0211 01/31/21 0138 02/01/21 0228  WBC 4.0 5.1 5.3  RBC 3.75* 3.87* 3.76*  HGB 11.4* 11.6* 11.4*  HCT 34.7* 35.5* 35.1*  MCV 92.5 91.7 93.4  MCH 30.4 30.0 30.3  MCHC 32.9 32.7 32.5  RDW 14.5 14.8 14.8  PLT 215 242 232   BNPNo results for  input(s): BNP, PROBNP in the last 168 hours.  DDimer No results for input(s): DDIMER in the last 168 hours.   Radiology  DG Abd 1 View  Result Date: 02/01/2021 CLINICAL DATA:  Nausea and vomiting. EXAM: ABDOMEN - 1 VIEW COMPARISON:  December 25, 2020 FINDINGS: Hernia mesh projects over the pelvis. Degenerative changes in the lumbar spine. No other bony abnormalities. No free air, portal venous gas, or pneumatosis. No bowel obstruction. IMPRESSION: No acute abnormalities identified. Electronically Signed   By: Dorise Bullion III M.D   On: 02/01/2021 10:25    Cardiac Studies  TTE 01/07/2021 1. Left ventricular ejection fraction, by estimation, is 50 to 55%. The  left ventricle has low normal function. The left ventricle has no regional  wall motion abnormalities. There is mild left ventricular hypertrophy.  Left ventricular diastolic  parameters are consistent with Grade I diastolic dysfunction (impaired  relaxation).  2. Right ventricular systolic function is normal. The right ventricular  size is normal.  3. The mitral valve is normal in structure. No evidence of mitral valve  regurgitation.  4. The aortic valve is tricuspid. Aortic valve regurgitation is mild.  5. Aortic dilatation noted. There is moderate dilatation of the ascending  aorta, measuring 48 mm. There is mild dilatation of the aortic root,  measuring 40 mm.  6. The inferior vena cava is normal in size with greater than 50%  respiratory variability, suggesting right atrial pressure of 3 mmHg.   Patient Profile  Jack [name] is a 87 y.o. male with coronary calcifications, hyperlipidemia, hypertension, CKD stage III who was admitted on 01/30/2020 with a new L1 compression fracture.  He has a known T12 compression fracture that was managed medically.  Surgery plans to manage this medically.  Cardiology was consulted for A. fib with RVR on 01/30/2021.  Assessment & Plan   1.  New onset A. Fib -He was admitted initially  to the ER and had rapid A. Fib.  He self converted. -Suspect this is related to his new L1 compression fracture.  Surgery is planned. -We will continue with his amiodarone load.  400 mg twice daily for 7 days and then 200 mg daily for 21 days.  This  will get him through surgery. -He is on a heparin drip.  Plans to transition to oral anticoagulation likely after surgery. -Echo normal in April. -No evidence of heart failure. -He can follow-up in [place] at discharge.  [facility] will sign off.   Medication Recommendations: As above Other recommendations (labs, testing, etc): None Follow up as an outpatient: Cardiology will follow peripherally.  Please notify us if there is a change in his condition once he is closer to discharge.  We can help arrange follow-up in [place].  For questions or updates, please contact [place] Please consult [email or web address] for contact info under   Time Spent with Patient: I have spent a total of 25 minutes with patient reviewing hospital notes, telemetry, EKGs, labs and examining the patient as well as establishing an assessment and plan that was discussed with the patient.  > 50% of time was spent in direct patient care.    Signed, Addison Naegeli. Audie Box, MD, [place]  02/01/2021 10:39 AM

## 2021-02-02 ENCOUNTER — Inpatient Hospital Stay (HOSPITAL_COMMUNITY): Payer: PPO

## 2021-02-02 DIAGNOSIS — S22080D Wedge compression fracture of T11-T12 vertebra, subsequent encounter for fracture with routine healing: Secondary | ICD-10-CM | POA: Diagnosis not present

## 2021-02-02 DIAGNOSIS — E43 Unspecified severe protein-calorie malnutrition: Secondary | ICD-10-CM | POA: Diagnosis not present

## 2021-02-02 DIAGNOSIS — R110 Nausea: Secondary | ICD-10-CM | POA: Diagnosis not present

## 2021-02-02 HISTORY — PX: IR KYPHO LUMBAR INC FX REDUCE BONE BX UNI/BIL CANNULATION INC/IMAGING: IMG5519

## 2021-02-02 HISTORY — PX: IR KYPHO EA ADDL LEVEL THORACIC OR LUMBAR: IMG5520

## 2021-02-02 LAB — CBC
HCT: 34.9 % — ABNORMAL LOW (ref 39.0–52.0)
Hemoglobin: 11.1 g/dL — ABNORMAL LOW (ref 13.0–17.0)
MCH: 30 pg (ref 26.0–34.0)
MCHC: 31.8 g/dL (ref 30.0–36.0)
MCV: 94.3 fL (ref 80.0–100.0)
Platelets: 228 10*3/uL (ref 150–400)
RBC: 3.7 MIL/uL — ABNORMAL LOW (ref 4.22–5.81)
RDW: 14.6 % (ref 11.5–15.5)
WBC: 4.7 10*3/uL (ref 4.0–10.5)
nRBC: 0 % (ref 0.0–0.2)

## 2021-02-02 LAB — BASIC METABOLIC PANEL
Anion gap: 8 (ref 5–15)
BUN: 29 mg/dL — ABNORMAL HIGH (ref 8–23)
CO2: 28 mmol/L (ref 22–32)
Calcium: 10.5 mg/dL — ABNORMAL HIGH (ref 8.9–10.3)
Chloride: 102 mmol/L (ref 98–111)
Creatinine, Ser: 2.21 mg/dL — ABNORMAL HIGH (ref 0.61–1.24)
GFR, Estimated: 28 mL/min — ABNORMAL LOW (ref >60)
Glucose, Bld: 110 mg/dL — ABNORMAL HIGH (ref 70–99)
Potassium: 4.4 mmol/L (ref 3.5–5.1)
Sodium: 138 mmol/L (ref 135–145)

## 2021-02-02 LAB — HEPARIN LEVEL (UNFRACTIONATED): Heparin Unfractionated: 0.42 IU/mL (ref 0.30–0.70)

## 2021-02-02 MED ORDER — BISACODYL 5 MG PO TBEC
5.0000 mg | DELAYED_RELEASE_TABLET | Freq: Once | ORAL | Status: AC
  Administered 2021-02-02: 5 mg via ORAL
  Filled 2021-02-02: qty 1

## 2021-02-02 MED ORDER — MIDAZOLAM HCL 2 MG/2ML IJ SOLN
INTRAMUSCULAR | Status: AC
  Filled 2021-02-02: qty 2

## 2021-02-02 MED ORDER — BUPIVACAINE HCL (PF) 0.5 % IJ SOLN
INTRAMUSCULAR | Status: AC
  Filled 2021-02-02: qty 30

## 2021-02-02 MED ORDER — IOHEXOL 300 MG/ML  SOLN
50.0000 mL | Freq: Once | INTRAMUSCULAR | Status: AC | PRN
  Administered 2021-02-02: 30 mL

## 2021-02-02 MED ORDER — MORPHINE SULFATE (PF) 2 MG/ML IV SOLN
2.0000 mg | Freq: Once | INTRAVENOUS | Status: AC
  Administered 2021-02-02: 2 mg via INTRAVENOUS

## 2021-02-02 MED ORDER — LIDOCAINE HCL 1 % IJ SOLN
INTRAMUSCULAR | Status: AC | PRN
  Administered 2021-02-02: 20 mL

## 2021-02-02 MED ORDER — HEPARIN (PORCINE) 25000 UT/250ML-% IV SOLN
1150.0000 [IU]/h | INTRAVENOUS | Status: DC
  Administered 2021-02-02: 1150 [IU]/h via INTRAVENOUS
  Filled 2021-02-02: qty 250

## 2021-02-02 MED ORDER — FENTANYL CITRATE (PF) 100 MCG/2ML IJ SOLN
INTRAMUSCULAR | Status: AC
  Filled 2021-02-02: qty 2

## 2021-02-02 MED ORDER — AMLODIPINE BESYLATE 10 MG PO TABS: 10.0000 mg | ORAL_TABLET | Freq: Every day | ORAL | Status: DC

## 2021-02-02 MED ORDER — HYDROMORPHONE HCL 1 MG/ML IJ SOLN
0.5000 mg | Freq: Once | INTRAMUSCULAR | Status: DC
  Filled 2021-02-02: qty 1

## 2021-02-02 MED ORDER — HYDRALAZINE HCL 50 MG PO TABS
50.0000 mg | ORAL_TABLET | Freq: Three times a day (TID) | ORAL | Status: DC
  Administered 2021-02-02 – 2021-02-03 (×2): 50 mg via ORAL
  Filled 2021-02-02 (×3): qty 1

## 2021-02-02 MED ORDER — SODIUM CHLORIDE 0.9 % IV SOLN: INTRAVENOUS | Status: AC

## 2021-02-02 MED ORDER — BISACODYL 10 MG RE SUPP
10.0000 mg | Freq: Once | RECTAL | Status: DC
  Filled 2021-02-02: qty 1

## 2021-02-02 MED ORDER — SODIUM CHLORIDE 0.9 % IV SOLN: INTRAVENOUS | Status: DC

## 2021-02-02 MED ORDER — CALCIUM CARBONATE-VITAMIN D 500-200 MG-UNIT PO TABS
2.0000 | ORAL_TABLET | Freq: Two times a day (BID) | ORAL | Status: DC
  Administered 2021-02-03 – 2021-02-05 (×5): 2 via ORAL
  Filled 2021-02-02 (×6): qty 2

## 2021-02-02 MED ORDER — MORPHINE SULFATE (PF) 4 MG/ML IV SOLN
3.0000 mg | INTRAVENOUS | Status: DC | PRN
Start: 2021-02-02 — End: 2021-02-03
  Administered 2021-02-02 (×2): 3 mg via INTRAVENOUS
  Filled 2021-02-02 (×2): qty 1

## 2021-02-02 MED ORDER — HYDRALAZINE HCL 25 MG PO TABS
25.0000 mg | ORAL_TABLET | Freq: Four times a day (QID) | ORAL | Status: DC
  Administered 2021-02-02: 25 mg via ORAL
  Filled 2021-02-02: qty 1

## 2021-02-02 MED ORDER — FENTANYL CITRATE (PF) 100 MCG/2ML IJ SOLN
INTRAMUSCULAR | Status: AC | PRN
  Administered 2021-02-02 (×2): 25 ug via INTRAVENOUS

## 2021-02-02 MED ORDER — MIDAZOLAM HCL 2 MG/2ML IJ SOLN
INTRAMUSCULAR | Status: AC | PRN
  Administered 2021-02-02: 1 mg via INTRAVENOUS

## 2021-02-02 MED ORDER — HYDRALAZINE HCL 20 MG/ML IJ SOLN
10.0000 mg | INTRAMUSCULAR | Status: DC | PRN
  Administered 2021-02-02 – 2021-02-04 (×2): 10 mg via INTRAVENOUS
  Filled 2021-02-02 (×2): qty 1

## 2021-02-02 NOTE — Progress Notes (Signed)
Per patient's wife, patient experienced a drop in oxygen saturation down to the 50's after receiving dilaudid IV at [facility] in April 2022. MD Dawood made notified of patient's wife request to not give dilaudid. RN will hold dilaudid for now. Will relay information to nightshift RN

## 2021-02-02 NOTE — Progress Notes (Addendum)
[place] for heparin Indication: atrial fibrillation  Allergies  Allergen Reactions  . Amlodipine Other (See Comments)    LE edema  . Azithromycin Nausea And Vomiting  . Lisinopril Cough    Patient Measurements: Height: 5\' 9"  (175.3 cm) Weight: 82.9 kg (182 lb 12.2 oz) IBW/kg (Calculated) : 70.7 Heparin Dosing Weight: 82.9 kg   Vital Signs: Temp: 97.7 F (36.5 C) (05/23 1230) Temp Source: Oral (05/23 1230) BP: 165/118 (05/23 1420) Pulse Rate: 80 (05/23 1420)  Labs: Recent Labs    01/31/21 0138 01/31/21 1146 02/01/21 0228 02/02/21 0355  HGB 11.6*  --  11.4* 11.1*  HCT 35.5*  --  35.1* 34.9*  PLT 242  --  232 228  LABPROT 15.3*  --   --   --   INR 1.2  --   --   --   HEPARINUNFRC 0.41 0.40 0.37 0.42  CREATININE 1.68*  --  1.79* 2.21*    Estimated Creatinine Clearance: 24.4 mL/min (A) (by C-G formula based on SCr of 2.21 mg/dL (H)).  Assessment: 87 y.o. male with Afib for heparin. Pt now s/p kyphoplasty, per Dr Estanislado Pandy - hold heparin x3h post-procedure. Will target lower heparin level goal for now with recent spinal surgery.  Goal of Therapy:  Heparin level 0.3-0.5 units/ml Monitor platelets by anticoagulation protocol: Yes   Plan:   Restart heparin with no bolus 1150 units/h at 1730 Check heparin level in 8h   Arrie Senate, PharmD, [place], [facility] Clinical Pharmacist [phone] Please check AMION for all [place] numbers 02/02/2021

## 2021-02-02 NOTE — Progress Notes (Signed)
Mobility Specialist: Progress Note   02/02/21 1731  Mobility  Activity  (Cancel)   Pt c/o pain upon entering room in his ribs, RN present. Going to hold off on ambulation until after x-ray.   [facility] Zidane Renner Mobility Specialist Mobility Specialist Phone: [phone]

## 2021-02-02 NOTE — Progress Notes (Signed)
Patient is complaining of bilateral rib cage pain, sharp quality , musculoskeletal, remains significant with another 2 mg of IV morphine, will try IV diludid, and obtain B/L rib cage xray, and will hold heparin drip overnight. Phillips Climes MD

## 2021-02-02 NOTE — Progress Notes (Signed)
PROGRESS NOTE    Jack [name]  XHB:[id number] DOB: 04-28-1933 DOA: 01/29/2021 PCP: [name], MD  Chief Complaint  Patient presents with  . Back Pain  . Nausea    Brief Narrative:     Jack [name] is a 87 y.o. male with history of hypertension, anemia, chronic kidney disease stage III who was recently admitted at [facility] for T12 compression fracture subsequently discharged to rehab was in rehab for 2 weeks discharged last week from rehab was doing well with the TLSO brace started having worsening pain over the last 3 days mostly in the low back with no radiation or any associated incontinence of urine or bowel.  Denies any fever chills.  Given the worsening pain patient came to the ER. MRI done which shows T12 and new L1 compression fracture , seen by neurosurgery, neuro interventional radiology, recommendation for kyphoplasty, patient developed A. fib with RVR 5/20.  Subjective:  No bowel movement yesterday, he does report nausea, tolerating his Ensure and fluid intake, but appetite remains poor.    Assessment & Plan:   Principal Problem:   T12 compression fracture (HCC) Active Problems:   Protein-calorie malnutrition, severe   T12 and new L1 compression fracture for which neurosurgery has been consulted.  -  Recommendation for kyphoplasty, I have discussed with his neurosurgeon group at [facility], and he wishes to proceed with the procedure here, so interventional radiology were consulted, plan for kyphoplasty on today -Discussed with neuro interventional, hold heparin GTT 2 hours before procedure. -Continue with as needed pain medications  Hypertension  - Patient used to be on ARB and hydrochlorothiazide which was recently discontinued due to worsening renal function. -Blood pressure remains elevated, will resume back on hydralazine, continue with as needed IV hydralazine as well.     A. fib with RVR -Required Cardizem drip  initially, converted to normal sinus rhythm, cardiology input input greatly appreciated, currently on amiodarone. -He is on IV heparin drip currently , can do Eliquis after procedure.  Hyperlipidemia  -on statins.  Anemia likely from renal disease.  - Appears to be baseline.  Hypokalemia -Repleted  CKD stage IIIb -Avoid nephrotoxic medications  Severe protein calorie malnutrition -Nutrition is consulted, continue with supplement  Nausea -KUB with no acute finding, this is most likely due to pain medicine    DVT prophylaxis:  On heparin GTT  Code Status: Full Family Communication: D/W wife by phone 5/22 Disposition:   Status is: Inpatient  Remains inpatient appropriate because:IV treatments appropriate due to intensity of illness or inability to take PO   Dispo: The patient is from: SNF              Anticipated d/c is to: SNF              Patient currently is not medically stable to d/c.   Difficult to place patient No       Consultants:   Interventional radiology  Cardiology     Objective: Vitals:   02/02/21 1405 02/02/21 1410 02/02/21 1415 02/02/21 1420  BP: (!) 150/107 (!) 165/110 (!) 147/99 (!) 165/118  Pulse: 92 87 88 80  Resp: 20 (!) 22 (!) 23 18  Temp:      TempSrc:      SpO2: 99% 99% 98% 98%  Weight:      Height:        Intake/Output Summary (Last 24 hours) at 02/02/2021 1432 Last data filed at 02/02/2021 1028 Gross per  24 hour  Intake 892.48 ml  Output 1100 ml  Net -207.52 ml   Filed Weights   01/30/21 0529  Weight: 82.9 kg    Examination:  Awake Alert, Oriented X 3,frail,  weak, no new deficits Symmetrical Chest wall movement, Good air movement bilaterally, CTAB RRR,No Gallops,Rubs or new Murmurs, No Parasternal Heave +ve B.Sounds, Abd Soft, No tenderness, No rebound - guarding or rigidity. No Cyanosis, Clubbing or edema, No new Rash or bruise        Data Reviewed: I have personally reviewed following labs and imaging  studies  CBC: Recent Labs  Lab 01/29/21 1425 01/30/21 0211 01/31/21 0138 02/01/21 0228 02/02/21 0355  WBC 4.1 4.0 5.1 5.3 4.7  NEUTROABS 3.0  --   --   --   --   HGB 11.1* 11.4* 11.6* 11.4* 11.1*  HCT 34.2* 34.7* 35.5* 35.1* 34.9*  MCV 93.7 92.5 91.7 93.4 94.3  PLT 219 215 242 232 101    Basic Metabolic Panel: Recent Labs  Lab 01/29/21 1425 01/30/21 0211 01/31/21 0138 02/01/21 0228 02/02/21 0355  NA 138 136 139 139 138  K 3.4* 3.2* 3.8 4.0 4.4  CL 104 104 102 102 102  CO2 24 25 26 28 28   GLUCOSE 106* 111* 127* 115* 110*  BUN 21 19 22  27* 29*  CREATININE 1.66* 1.58* 1.68* 1.79* 2.21*  CALCIUM 9.6 9.5 10.3 10.3 10.5*  MG  --   --  2.4  --   --     GFR: Estimated Creatinine Clearance: 24.4 mL/min (A) (by C-G formula based on SCr of 2.21 mg/dL (H)).  Liver Function Tests: Recent Labs  Lab 01/29/21 1425  AST 17  ALT 11  ALKPHOS 104  BILITOT 0.8  PROT 6.1*  ALBUMIN 3.5    CBG: No results for input(s): GLUCAP in the last 168 hours.   Recent Results (from the past 240 hour(s))  Surgical pcr screen     Status: None   Collection Time: 01/30/21  5:20 AM   Specimen: Nasal Mucosa; Nasal Swab  Result Value Ref Range Status   MRSA, PCR NEGATIVE NEGATIVE Final   Staphylococcus aureus NEGATIVE NEGATIVE Final    Comment: (NOTE) The Xpert SA Assay (FDA approved for NASAL specimens in patients 77 years of age and older), is one component of a comprehensive surveillance program. It is not intended to diagnose infection nor to guide or monitor treatment. Performed at [facility], [place] [address] [place], [place] [address]          Radiology Studies: DG Abd 1 View  Result Date: 02/01/2021 CLINICAL DATA:  Nausea and vomiting. EXAM: ABDOMEN - 1 VIEW COMPARISON:  December 25, 2020 FINDINGS: Hernia mesh projects over the pelvis. Degenerative changes in the lumbar spine. No other bony abnormalities. No free air, portal venous gas, or pneumatosis. No bowel  obstruction. IMPRESSION: No acute abnormalities identified. Electronically Signed   By: Dorise Bullion III M.D   On: 02/01/2021 10:25        Scheduled Meds: . amiodarone  400 mg Oral BID   Followed by  . [START ON 02/07/2021] amiodarone  200 mg Oral Daily  . atorvastatin  40 mg Oral q1800  . bupivacaine      . calcium-vitamin D  1 tablet Oral BID WC  . docusate sodium  100 mg Oral BID  . feeding supplement  237 mL Oral TID BM  . polyethylene glycol  17 g Oral BID  . tamsulosin  0.4 mg Oral Daily   Continuous Infusions: . sodium chloride 100 mL/hr at 02/02/21 0844  . heparin Stopped (02/02/21 1039)     LOS: 3 days      Phillips Climes, MD [facility]   To contact the attending provider between 7A-7P or the covering provider during after hours 7P-7A, please log into the web site [email or web address] and access using universal [place] password for that web site. If you do not have the password, please call the hospital operator.  02/02/2021, 2:32 PM

## 2021-02-02 NOTE — Progress Notes (Signed)
Patient reports 10/10 constant sharp pain on  bilateral rib cages that's unrelieved by PRN  IV morphine. MD, Emeline Gins notified and order for additional 2mg  morphine once and CXR. Will continue to mx

## 2021-02-02 NOTE — Progress Notes (Signed)
Patient was seen and examined, still complaining of musculoskeletal chest pain, chest pain diffusely tender to palpation in anterior area bilaterally and sternal area, abdomen is mildly distended, but bowel sounds present, he does appear to be uncomfortable, musculoskeletal pain started after he came back from kyphoplasty procedure likely from [place] on his chest for procedure, stat portable chest x-ray ordered, have discussed with radiologist, there is no evidence of rib fracture, rib x-ray which is more accurate and still pending, but pains appearing diffuse, and nonfocal, wife reports adverse reaction to Dilaudid, will try to avoid, I will increase his morphine to 3 mg IV every 2 hours, abdomen mildly distended, good bowel sounds, but will give Dulcolax suppository and p.o. Dulcolax, blood pressure elevated most likely in the setting of uncontrolled pain, but I will still go ahead and increase his hydralazine to 50 mg oral 3 times daily, wife was called by phone and updated. Phillips Climes MD

## 2021-02-02 NOTE — Procedures (Signed)
S/P T 12 and L1 balloon KPS. S.Israel Wunder MD

## 2021-02-03 ENCOUNTER — Inpatient Hospital Stay (HOSPITAL_COMMUNITY): Payer: PPO

## 2021-02-03 DIAGNOSIS — S22080D Wedge compression fracture of T11-T12 vertebra, subsequent encounter for fracture with routine healing: Secondary | ICD-10-CM | POA: Diagnosis not present

## 2021-02-03 DIAGNOSIS — N179 Acute kidney failure, unspecified: Secondary | ICD-10-CM | POA: Diagnosis not present

## 2021-02-03 DIAGNOSIS — I10 Essential (primary) hypertension: Secondary | ICD-10-CM

## 2021-02-03 DIAGNOSIS — R110 Nausea: Secondary | ICD-10-CM | POA: Diagnosis not present

## 2021-02-03 LAB — BASIC METABOLIC PANEL
Anion gap: 9 (ref 5–15)
BUN: 26 mg/dL — ABNORMAL HIGH (ref 8–23)
CO2: 28 mmol/L (ref 22–32)
Calcium: 10.5 mg/dL — ABNORMAL HIGH (ref 8.9–10.3)
Chloride: 104 mmol/L (ref 98–111)
Creatinine, Ser: 2.46 mg/dL — ABNORMAL HIGH (ref 0.61–1.24)
GFR, Estimated: 25 mL/min — ABNORMAL LOW (ref >60)
Glucose, Bld: 122 mg/dL — ABNORMAL HIGH (ref 70–99)
Potassium: 4.3 mmol/L (ref 3.5–5.1)
Sodium: 141 mmol/L (ref 135–145)

## 2021-02-03 LAB — CBC
HCT: 35.9 % — ABNORMAL LOW (ref 39.0–52.0)
Hemoglobin: 11.6 g/dL — ABNORMAL LOW (ref 13.0–17.0)
MCH: 30.4 pg (ref 26.0–34.0)
MCHC: 32.3 g/dL (ref 30.0–36.0)
MCV: 94.2 fL (ref 80.0–100.0)
Platelets: 238 10*3/uL (ref 150–400)
RBC: 3.81 MIL/uL — ABNORMAL LOW (ref 4.22–5.81)
RDW: 14.6 % (ref 11.5–15.5)
WBC: 5.2 10*3/uL (ref 4.0–10.5)
nRBC: 0 % (ref 0.0–0.2)

## 2021-02-03 LAB — CREATININE, URINE, RANDOM: Creatinine, Urine: 96.68 mg/dL

## 2021-02-03 LAB — SODIUM, URINE, RANDOM: Sodium, Ur: 50 mmol/L

## 2021-02-03 LAB — HEPARIN LEVEL (UNFRACTIONATED): Heparin Unfractionated: <0.1 IU/mL — ABNORMAL LOW (ref 0.30–0.70)

## 2021-02-03 MED ORDER — APIXABAN 2.5 MG PO TABS
2.5000 mg | ORAL_TABLET | Freq: Two times a day (BID) | ORAL | Status: DC
  Administered 2021-02-03 – 2021-02-07 (×9): 2.5 mg via ORAL
  Filled 2021-02-03 (×9): qty 1

## 2021-02-03 MED ORDER — BISACODYL 10 MG RE SUPP
10.0000 mg | Freq: Two times a day (BID) | RECTAL | Status: DC
  Administered 2021-02-03: 10 mg via RECTAL
  Filled 2021-02-03: qty 1

## 2021-02-03 MED ORDER — BISACODYL 10 MG RE SUPP
10.0000 mg | Freq: Once | RECTAL | Status: AC
  Administered 2021-02-03: 10 mg via RECTAL
  Filled 2021-02-03: qty 1

## 2021-02-03 MED ORDER — MORPHINE SULFATE (PF) 2 MG/ML IV SOLN
2.0000 mg | INTRAVENOUS | Status: DC | PRN
Start: 2021-02-03 — End: 2021-02-07
  Administered 2021-02-04 – 2021-02-05 (×4): 2 mg via INTRAVENOUS
  Filled 2021-02-03 (×4): qty 1

## 2021-02-03 MED ORDER — METOPROLOL TARTRATE 25 MG PO TABS
25.0000 mg | ORAL_TABLET | Freq: Two times a day (BID) | ORAL | Status: DC
  Administered 2021-02-03 – 2021-02-04 (×3): 25 mg via ORAL
  Filled 2021-02-03 (×3): qty 1

## 2021-02-03 MED ORDER — HEPARIN (PORCINE) 25000 UT/250ML-% IV SOLN
1150.0000 [IU]/h | INTRAVENOUS | Status: DC
  Administered 2021-02-03: 1150 [IU]/h via INTRAVENOUS
  Filled 2021-02-03: qty 250

## 2021-02-03 MED ORDER — POLYETHYLENE GLYCOL 3350 17 G PO PACK
17.0000 g | PACK | Freq: Two times a day (BID) | ORAL | Status: AC
  Administered 2021-02-03 – 2021-02-05 (×2): 17 g via ORAL
  Filled 2021-02-03 (×3): qty 1

## 2021-02-03 MED ORDER — HYDRALAZINE HCL 50 MG PO TABS
75.0000 mg | ORAL_TABLET | Freq: Four times a day (QID) | ORAL | Status: DC
  Administered 2021-02-03 – 2021-02-04 (×4): 75 mg via ORAL
  Filled 2021-02-03 (×4): qty 1

## 2021-02-03 MED ORDER — HYDRALAZINE HCL 50 MG PO TABS: 75.0000 mg | ORAL_TABLET | Freq: Three times a day (TID) | ORAL | Status: DC

## 2021-02-03 NOTE — Progress Notes (Signed)
Patient ID: Jack [name], male   DOB: Jan 25, 1933, 87 y.o.   MRN: [id number] INR. Pos procedure day1 Patient reports sig pain relief following KPs at T12 and L1. Says pain level today is  a 4/10 compared to 10/10 prior to the procedure. According to the nurse has not asked for morphine today. Earlier ambulated with assistance with PT. Does report bilateral anterior rib cage pain on returning to the floor  following the procedure. Says pain brought on with turning from sided to side and coughing .  Points to anterior chest wall bilaterally.  On examination  is in no acute distress. Tender++ on palpating the anterior rib cage bilaterally Not tender posteriorly or over T12 and L1.  Rib series suspicious for Lt anterolateral fifth rib fracture. No adjacent pneumothorax evident. Plan. 1.Would continue conservative management of the  suspected  Lt  5th rib fracture. 2..Suggest repeat bilateral rib series in 7 to 10 days as per patients symptoms. 3.Would encourage breathing exercises. 4.Pain management with analgesics as needed..   D/W Dr Tereasa Coop MD

## 2021-02-03 NOTE — Progress Notes (Addendum)
PROGRESS NOTE    Jack [name]  YOV:[id number] DOB: [birth year] DOA: 01/29/2021 PCP: [name], MD  Chief Complaint  Patient presents with  . Back Pain  . Nausea    Brief Narrative:     Jack [name] is a 87 y.o. male with history of hypertension, anemia, chronic kidney disease stage III who was recently admitted at [facility] for T12 compression fracture subsequently discharged to rehab was in rehab for 2 weeks discharged last week from rehab was doing well with the TLSO brace started having worsening pain over the last 3 days mostly in the low back with no radiation or any associated incontinence of urine or bowel.  Denies any fever chills.  Given the worsening pain patient came to the ER. MRI done which shows T12 and new L1 compression fracture , seen by neurosurgery, neuro interventional radiology, recommendation for kyphoplasty, patient developed A. fib with RVR 5/20.  Had kyphoplasty 5/23, after procedure he started to complain of musculoskeletal chest pain , rib x-ray significant for possible nondisplaced left fifth rib fracture.  Subjective:  Planing of bilateral musculoskeletal chest pain started overnight, reports some nausea, denies any vomiting, reports his lower back pain is significantly improved (its gone this morning ).  Assessment & Plan:   Principal Problem:   T12 compression fracture (HCC) Active Problems:   Protein-calorie malnutrition, severe   T12 and new L1 compression fracture for which neurosurgery has been consulted.  -  Recommendation for kyphoplasty, I have discussed with his neurosurgeon group at [facility], and he wishes to proceed with the procedure here, so interventional radiology were consulted, patient went for kyphoplasty by Dr. Estanislado Pandy 5/23 . -Pain has significantly improved this morning, will decrease his pain regimen.   -Continue with calcium and vitamin D -Discontinue strict bedrest, and start on  PT/OT.  Hypertension  - Patient used to be on ARB and hydrochlorothiazide which was recently discontinued due to worsening renal function. -Blood pressure remains elevated, endralazine to 75 mg 4 times daily, and will start on low-dose metoprolol, continue with as needed hydralazine.    A. fib with RVR -Required Cardizem drip initially, converted to normal sinus rhythm, cardiology input input greatly appreciated, currently on amiodarone. -He is on IV heparin drip currently , will change to Eliquis  AKI on CKD stage IIIb -Creatinine is up to 2.4 today, no evidence of urinary retention, will check urinary sodium and creatinine, will start on IV fluids. -Avoid nephrotoxic medications  Possible 5th  trip fracture -Patient complaining of bilateral musculoskeletal chest pain, on physical exam there is nothing focal, he is just diffusely tender on both sides, rib x-ray current rule out possible fifth rib fracture, discussed with patient, he was encouraged use incentive spirometry, will continue with pain control, and continue with conservative management, will repeat rib x-ray in 5 to 7 days for further confirmation . -but overall patient appears to be much more comfortable at this point, now lower back is gone, he has not been requesting any IV morphine anymore, his pain has been managed with tramadol.   Hyperlipidemia  -on statins.  Anemia likely from renal disease.  - Appears to be baseline.  Hypokalemia -Repleted  Severe protein calorie malnutrition -Nutrition is consulted, continue with supplement  Nausea -KUB with no acute finding, this is most likely due to pain medicine    DVT prophylaxis:  On heparin GTT  Code Status: Full Family Communication: D/W wife by phone daily Disposition:   Status  is: Inpatient  Remains inpatient appropriate because:IV treatments appropriate due to intensity of illness or inability to take PO   Dispo: The patient is from: SNF               Anticipated d/c is to: SNF              Patient currently is not medically stable to d/c.   Difficult to place patient No       Consultants:   Interventional radiology  Cardiology     Objective: Vitals:   02/02/21 2000 02/02/21 2321 02/02/21 2323 02/03/21 0919  BP: (!) 166/93  (!) 146/97   Pulse: 96  97   Resp: 20  19   Temp: 97.9 F (36.6 C)  98 F (36.7 C) 97.9 F (36.6 C)  TempSrc: Oral  Oral Oral  SpO2: 97% 96% 94%   Weight:      Height:        Intake/Output Summary (Last 24 hours) at 02/03/2021 1422 Last data filed at 02/03/2021 1359 Gross per 24 hour  Intake 711.61 ml  Output 1000 ml  Net -288.39 ml   Filed Weights   01/30/21 0529  Weight: 82.9 kg    Examination:  Awake Alert, Oriented X 3, frail, deconditioned no new F.N deficits, Normal affect Symmetrical Chest wall movement, Good air movement bilaterally, CTAB, he does have some chest wall tenderness to palpation.  Diffuse, nothing focal. RRR,No Gallops,Rubs or new Murmurs, No Parasternal Heave +ve B.Sounds, Abd Soft, No tenderness, No rebound - guarding or rigidity. No Cyanosis, Clubbing or edema, No new Rash or bruise      Data Reviewed: I have personally reviewed following labs and imaging studies  CBC: Recent Labs  Lab 01/29/21 1425 01/30/21 0211 01/31/21 0138 02/01/21 0228 02/02/21 0355 02/03/21 0207  WBC 4.1 4.0 5.1 5.3 4.7 5.2  NEUTROABS 3.0  --   --   --   --   --   HGB 11.1* 11.4* 11.6* 11.4* 11.1* 11.6*  HCT 34.2* 34.7* 35.5* 35.1* 34.9* 35.9*  MCV 93.7 92.5 91.7 93.4 94.3 94.2  PLT 219 215 242 232 228 675    Basic Metabolic Panel: Recent Labs  Lab 01/30/21 0211 01/31/21 0138 02/01/21 0228 02/02/21 0355 02/03/21 0207  NA 136 139 139 138 141  K 3.2* 3.8 4.0 4.4 4.3  CL 104 102 102 102 104  CO2 25 26 28 28 28   GLUCOSE 111* 127* 115* 110* 122*  BUN 19 22 27* 29* 26*  CREATININE 1.58* 1.68* 1.79* 2.21* 2.46*  CALCIUM 9.5 10.3 10.3 10.5* 10.5*  MG  --  2.4  --   --    --     GFR: Estimated Creatinine Clearance: 22 mL/min (A) (by C-G formula based on SCr of 2.46 mg/dL (H)).  Liver Function Tests: Recent Labs  Lab 01/29/21 1425  AST 17  ALT 11  ALKPHOS 104  BILITOT 0.8  PROT 6.1*  ALBUMIN 3.5    CBG: No results for input(s): GLUCAP in the last 168 hours.   Recent Results (from the past 240 hour(s))  Surgical pcr screen     Status: None   Collection Time: 01/30/21  5:20 AM   Specimen: Nasal Mucosa; Nasal Swab  Result Value Ref Range Status   MRSA, PCR NEGATIVE NEGATIVE Final   Staphylococcus aureus NEGATIVE NEGATIVE Final    Comment: (NOTE) The Xpert SA Assay (FDA approved for NASAL specimens in patients 59 years of age and older), is  one component of a comprehensive surveillance program. It is not intended to diagnose infection nor to guide or monitor treatment. Performed at [facility], [place] [address] [place], [place] [address]          Radiology Studies: DG Ribs Bilateral  Result Date: 02/03/2021 CLINICAL DATA:  Pain. EXAM: BILATERAL RIBS - 3+ VIEW COMPARISON:  Chest x-ray 02/02/2021. FINDINGS: Nondisplaced left anterolateral fifth rib fracture cannot be completely excluded. Prior thoracolumbar vertebroplasties. Calcifications noted over the left lung most likely calcified granulomas. Bibasilar atelectasis. IMPRESSION: Nondisplaced left anterolateral fifth rib fracture cannot be completely excluded. No evidence of pneumothorax. Electronically Signed   By: Marcello Moores  Register   On: 02/03/2021 05:18   DG Chest Port 1 View  Result Date: 02/02/2021 CLINICAL DATA:  Rib pain after kyphoplasty today. EXAM: PORTABLE CHEST 1 VIEW COMPARISON:  Radiograph 01/10/2013, abdominal CT 01/29/2021 FINDINGS: Elevated right hemidiaphragm, with progression from prior exam. The heart is normal in size. Smooth prominence of the right mediastinum is likely due to aortic tortuosity. No pneumothorax. Minor bibasilar atelectasis. No confluent  consolidation. No pleural effusion. Kyphoplasty not visualized on the current exam. No evidence of kyphoplasty cement in the thoracic vessels. No evidence of rib fracture. IMPRESSION: 1. Elevated right hemidiaphragm, progressed from prior exam. Minor bibasilar atelectasis. 2. Smooth prominence of the right mediastinal border is likely due to aortic tortuosity. The ascending aorta is dilated on included portion from recent abdominal CT consistent with ascending aortic aneurysm. Recommend nonemergent chest CT to assess the entire thoracic aorta. Electronically Signed   By: Keith Rake M.D.   On: 02/02/2021 20:09        Scheduled Meds: . amiodarone  400 mg Oral BID   Followed by  . [START ON 02/07/2021] amiodarone  200 mg Oral Daily  . atorvastatin  40 mg Oral q1800  . bisacodyl  10 mg Rectal Once  . calcium-vitamin D  2 tablet Oral BID WC  . docusate sodium  100 mg Oral BID  . feeding supplement  237 mL Oral TID BM  . hydrALAZINE  50 mg Oral Q8H  . tamsulosin  0.4 mg Oral Daily   Continuous Infusions: . sodium chloride 50 mL/hr at 02/03/21 0306  . heparin 1,150 Units/hr (02/03/21 0935)     LOS: 4 days      Phillips Climes, MD [facility]   To contact the attending provider between 7A-7P or the covering provider during after hours 7P-7A, please log into the web site [email or web address] and access using universal [place] password for that web site. If you do not have the password, please call the hospital operator.  02/03/2021, 2:22 PM

## 2021-02-03 NOTE — Progress Notes (Addendum)
RN attempted multiple times to get patient to agree to Dulcolax suppository, patient states that he knows that his pain is not due to needing to have a bowel movement.  States that his ribs are fractured from his surgery, patients abdomen is distended and tender to touch, RN was able to get to agree to take PO Dulcolax and Colace,  Patient advises that he is in too much pain to be able to have a bowl movement, that once he is in less pain that he might allow to be given suppository.  Patient wife called to get update on patient, RN advised wife of plan of care for the evening consisting of bilateral rib xray and bowel movement.  Patient wife advised that she does not believe that the patient needs to have a bowel movement, wife states that she had researched the surgery that was performed and that there could be some nerve damage as a result of the surgery that could be causing the patient's pain.

## 2021-02-03 NOTE — Discharge Instructions (Signed)
1.No stooping,orbending or lifting more than 10 lbs for 2 weeks. 2.Use walker for 2 weeks. 3.No driving for 2 weeks. 4.RTC PRN 2  To 4 weeks     Information on my medicine - ELIQUIS (apixaban)  This medication education was reviewed with me or my healthcare representative as part of my discharge preparation. Why was Eliquis prescribed for you? Eliquis was prescribed for you to reduce the risk of forming blood clots that can cause a stroke if you have a medical condition called atrial fibrillation (a type of irregular heartbeat) OR to reduce the risk of a blood clots forming after orthopedic surgery.  What do You need to know about Eliquis ? Take your Eliquis TWICE DAILY - one tablet in the morning and one tablet in the evening with or without food.  It would be best to take the doses about the same time each day.  If you have difficulty swallowing the tablet whole please discuss with your pharmacist how to take the medication safely.  Take Eliquis exactly as prescribed by your doctor and DO NOT stop taking Eliquis without talking to the doctor who prescribed the medication.  Stopping may increase your risk of developing a new clot or stroke.  Refill your prescription before you run out.  After discharge, you should have regular check-up appointments with your healthcare provider that is prescribing your Eliquis.  In the future your dose may need to be changed if your kidney function or weight changes by a significant amount or as you get older.  What do you do if you miss a dose? If you miss a dose, take it as soon as you remember on the same day and resume taking twice daily.  Do not take more than one dose of ELIQUIS at the same time.  Important Safety Information A possible side effect of Eliquis is bleeding. You should call your healthcare provider right away if you experience any of the following: ? Bleeding from an injury or your nose that does not stop. ? Unusual colored  urine (red or dark brown) or unusual colored stools (red or black). ? Unusual bruising for unknown reasons. ? A serious fall or if you hit your head (even if there is no bleeding).  Some medicines may interact with Eliquis and might increase your risk of bleeding or clotting while on Eliquis. To help avoid this, consult your healthcare provider or pharmacist prior to using any new prescription or non-prescription medications, including herbals, vitamins, non-steroidal anti-inflammatory drugs (NSAIDs) and supplements.  This website has more information on Eliquis (apixaban): [email or web address].

## 2021-02-03 NOTE — Evaluation (Addendum)
Physical Therapy Evaluation Patient Details Name: Jack [name] MRN: [id number] DOB: 05-01-1933 Today's Date: 02/03/2021   History of Present Illness  Pt adm 5/19 with worsening back pain with known T12 compression fx that was being treated conservatively. MRI showed worsening T12 compression fx and new L1 compression fx. Developed Afib with RVR on 5/20. Underwent kyphoplasty on 5/23 for T12 and L1. Developed musculoskeletal chest pain after procedure and x ray showed possible lt 5th rib fx. PMH - HTN, ckd  Clinical Impression  Pt presents to PT with significant limitations in mobility due to back pain, weakness, and decr balance. [facility] and wants to return home instead of rehab. Pt will need to improve fairly quickly to get to a level that his wife can manage at home. Will recommend SNF at this time but will continue to monitor and update this as needed.     Follow Up Recommendations SNF (Pt/wife would like for pt to go home but pt will need to be at a supervision/min guard level for that to happen.)    Equipment Recommendations  None recommended by PT    Recommendations for Other Services       Precautions / Restrictions Precautions Precautions: Back Precaution Comments: Unsure if pt still needs brace now that he had kyphoplasty. Put it on until MD can clarify. Required Braces or Orthoses: Spinal Brace Spinal Brace: Thoracolumbosacral orthotic;Applied in sitting position      Mobility  Bed Mobility Overal bed mobility: Needs Assistance Bed Mobility: Rolling;Sidelying to Sit;Sit to Sidelying Rolling: Min assist Sidelying to sit: Mod assist     Sit to sidelying: +2 for physical assistance;Mod assist General bed mobility comments: Assist to elevate trunk into sitting and bring hips to EOB    Transfers Overall transfer level: Needs assistance Equipment used: Rolling walker (2 wheeled) Transfers: Sit to/from Stand Sit to Stand: +2 physical assistance;Mod  assist;From elevated surface         General transfer comment: Assist to bring hips up and for balance. Bed raised ~6 inches  Ambulation/Gait Ambulation/Gait assistance: Min assist;+2 safety/equipment Gait Distance (Feet): 10 Feet Assistive device: Rolling walker (2 wheeled) Gait Pattern/deviations: Step-through pattern;Decreased step length - right;Decreased step length - left;Shuffle;Narrow base of support Gait velocity: decr Gait velocity interpretation: <1.31 ft/sec, indicative of household ambulator General Gait Details: Assist for balance and support  Stairs            Wheelchair Mobility    Modified Rankin (Stroke Patients Only)       Balance Overall balance assessment: Needs assistance Sitting-balance support: Bilateral upper extremity supported;Feet supported Sitting balance-Leahy Scale: Poor Sitting balance - Comments: Using UE's likely primarily due to pain   Standing balance support: Bilateral upper extremity supported Standing balance-Leahy Scale: Poor Standing balance comment: walker and min assist for static standing                             Pertinent Vitals/Pain Pain Assessment: 0-10 Pain Score: 10-Worst pain ever Pain Location: Hips with movement - reports this is how his back pain presented Pain Descriptors / Indicators: Grimacing;Guarding Pain Intervention(s): Limited activity within patient's tolerance;Monitored during session;Repositioned    Home Living Family/patient expects to be discharged to:: Private residence Living Arrangements: Spouse/significant other Available Help at Discharge: Family;Available 24 hours/day Type of Home: House Home Access: Stairs to enter   [employer] of Steps: 1 Home Layout: One level Home Equipment: Kasandra Knudsen -  single point;Shower seat;Grab bars - tub/shower;Hand held Tourist information centre manager - 2 wheels Additional Comments: lift chair    Prior Function Level of Independence: Needs assistance    Gait / Transfers Assistance Needed: Modified independent with rolling walker           Hand Dominance   Dominant Hand: Right    Extremity/Trunk Assessment   Upper Extremity Assessment Upper Extremity Assessment: Overall WFL for tasks assessed    Lower Extremity Assessment Lower Extremity Assessment: Generalized weakness       Communication   Communication: No difficulties  Cognition Arousal/Alertness: Awake/alert Behavior During Therapy: WFL for tasks assessed/performed Overall Cognitive Status: Within Functional Limits for tasks assessed                                        General Comments      Exercises     Assessment/Plan    PT Assessment Patient needs continued PT services  PT Problem List Decreased strength;Decreased activity tolerance;Decreased balance;Decreased mobility;Pain       PT Treatment Interventions DME instruction;Gait training;Stair training;Functional mobility training;Therapeutic activities;Therapeutic exercise;Balance training;Patient/family education    PT Goals (Current goals can be found in the Care Plan section)  Acute Rehab PT Goals Patient Stated Goal: return home PT Goal Formulation: With patient Time For Goal Achievement: 02/17/21 Potential to Achieve Goals: Fair    Frequency Min 3X/week   Barriers to discharge Decreased caregiver support wife can not do lifting    Co-evaluation               AM-PAC PT "6 Clicks" Mobility  Outcome Measure Help needed turning from your back to your side while in a flat bed without using bedrails?: A Little Help needed moving from lying on your back to sitting on the side of a flat bed without using bedrails?: A Lot Help needed moving to and from a bed to a chair (including a wheelchair)?: Total Help needed standing up from a chair using your arms (e.g., wheelchair or bedside chair)?: Total Help needed to walk in hospital room?: Total Help needed climbing 3-5  steps with a railing? : Total 6 Click Score: 9    End of Session Equipment Utilized During Treatment: Gait belt;Back brace Activity Tolerance: Patient limited by pain Patient left: in bed;with call bell/phone within reach;with bed alarm set;with family/visitor present Nurse Communication: Mobility status (Nurse assisted with mobility) PT Visit Diagnosis: Other abnormalities of gait and mobility (R26.89);Muscle weakness (generalized) (M62.81);Pain Pain - part of body:  (back)    Time: 2122-4825 PT Time Calculation (min) (ACUTE ONLY): 28 min   Charges:   PT Evaluation $PT Eval Moderate Complexity: 1 Mod PT Treatments $Gait Training: 8-22 mins        [place] Pager [phone] Office [place] 02/03/2021, 5:33 PM

## 2021-02-03 NOTE — Progress Notes (Addendum)
[place] for heparin Indication: atrial fibrillation  Allergies  Allergen Reactions  . Amlodipine Other (See Comments)    LE edema  . Azithromycin Nausea And Vomiting  . Lisinopril Cough    Patient Measurements: Height: 5\' 9"  (175.3 cm) Weight: 82.9 kg (182 lb 12.2 oz) IBW/kg (Calculated) : 70.7 Heparin Dosing Weight: 82.9 kg   Vital Signs: Temp: 98 F (36.7 C) (05/23 2323) Temp Source: Oral (05/23 2323) BP: 146/97 (05/23 2323) Pulse Rate: 97 (05/23 2323)  Labs: Recent Labs    02/01/21 0228 02/02/21 0355 02/03/21 0207  HGB 11.4* 11.1* 11.6*  HCT 35.1* 34.9* 35.9*  PLT 232 228 238  HEPARINUNFRC 0.37 0.42 <0.10*  CREATININE 1.79* 2.21* 2.46*    Estimated Creatinine Clearance: 22 mL/min (A) (by C-G formula based on SCr of 2.46 mg/dL (H)).  Assessment: 87 y.o. male with Afib for heparin. Pt now s/p kyphoplasty, per Dr Estanislado Pandy - ok to resume heparin 3h post-op. Heparin held overnight 5/23 with concerns for rib fracture, ok to restart now per MD.  Goal of Therapy:  Heparin level 0.3-0.5 units/ml Monitor platelets by anticoagulation protocol: Yes   Plan:   Restart heparin with no bolus 1150 units/h  Check heparin level in 8h  ADDENDUM: Changing to apixaban. -Stop heparin drip -Begin apixaban 2.5mg  BID   Arrie Senate, PharmD, BCPS, [facility] Clinical Pharmacist [phone] Please check AMION for all [place] numbers 02/03/2021

## 2021-02-04 DIAGNOSIS — S22080D Wedge compression fracture of T11-T12 vertebra, subsequent encounter for fracture with routine healing: Secondary | ICD-10-CM | POA: Diagnosis not present

## 2021-02-04 DIAGNOSIS — E43 Unspecified severe protein-calorie malnutrition: Secondary | ICD-10-CM | POA: Diagnosis not present

## 2021-02-04 LAB — CBC
HCT: 30.8 % — ABNORMAL LOW (ref 39.0–52.0)
Hemoglobin: 10 g/dL — ABNORMAL LOW (ref 13.0–17.0)
MCH: 30.6 pg (ref 26.0–34.0)
MCHC: 32.5 g/dL (ref 30.0–36.0)
MCV: 94.2 fL (ref 80.0–100.0)
Platelets: 229 10*3/uL (ref 150–400)
RBC: 3.27 MIL/uL — ABNORMAL LOW (ref 4.22–5.81)
RDW: 14.6 % (ref 11.5–15.5)
WBC: 4.1 10*3/uL (ref 4.0–10.5)
nRBC: 0 % (ref 0.0–0.2)

## 2021-02-04 LAB — BASIC METABOLIC PANEL
Anion gap: 7 (ref 5–15)
BUN: 32 mg/dL — ABNORMAL HIGH (ref 8–23)
CO2: 28 mmol/L (ref 22–32)
Calcium: 10.4 mg/dL — ABNORMAL HIGH (ref 8.9–10.3)
Chloride: 104 mmol/L (ref 98–111)
Creatinine, Ser: 2.35 mg/dL — ABNORMAL HIGH (ref 0.61–1.24)
GFR, Estimated: 26 mL/min — ABNORMAL LOW (ref >60)
Glucose, Bld: 112 mg/dL — ABNORMAL HIGH (ref 70–99)
Potassium: 4 mmol/L (ref 3.5–5.1)
Sodium: 139 mmol/L (ref 135–145)

## 2021-02-04 LAB — SARS CORONAVIRUS 2 (TAT 6-24 HRS): SARS Coronavirus 2: NEGATIVE

## 2021-02-04 MED ORDER — CARVEDILOL 12.5 MG PO TABS
12.5000 mg | ORAL_TABLET | Freq: Two times a day (BID) | ORAL | Status: DC
  Administered 2021-02-04 – 2021-02-05 (×2): 12.5 mg via ORAL
  Filled 2021-02-04 (×2): qty 1

## 2021-02-04 MED ORDER — HYDRALAZINE HCL 50 MG PO TABS
100.0000 mg | ORAL_TABLET | Freq: Three times a day (TID) | ORAL | Status: DC
  Administered 2021-02-04 – 2021-02-05 (×2): 100 mg via ORAL
  Filled 2021-02-04 (×3): qty 2

## 2021-02-04 MED ORDER — MIDAZOLAM HCL 2 MG/2ML IJ SOLN
INTRAMUSCULAR | Status: DC | PRN
  Administered 2021-02-02: 1 mg via INTRAVENOUS

## 2021-02-04 MED ORDER — SENNOSIDES-DOCUSATE SODIUM 8.6-50 MG PO TABS
1.0000 | ORAL_TABLET | Freq: Two times a day (BID) | ORAL | Status: DC
  Administered 2021-02-04: 1 via ORAL
  Filled 2021-02-04: qty 1

## 2021-02-04 MED ORDER — METHOCARBAMOL 500 MG PO TABS: 500.0000 mg | ORAL_TABLET | Freq: Four times a day (QID) | ORAL | Status: DC | PRN

## 2021-02-04 MED ORDER — SODIUM CHLORIDE 0.9 % IV SOLN: INTRAVENOUS | Status: AC

## 2021-02-04 NOTE — Evaluation (Signed)
Occupational Therapy Evaluation Patient Details Name: Jack [name] MRN: [id number] DOB: [birth year] Today's Date: 02/04/2021    History of Present Illness Pt adm 5/19 with worsening back pain with known T12 compression fx that was being treated conservatively. MRI showed worsening T12 compression fx and new L1 compression fx. Developed Afib with RVR on 5/20. Underwent kyphoplasty on 5/23 for T12 and L1. Developed musculoskeletal chest pain after procedure and x ray showed possible lt 5th rib fx. PMH - HTN, ckd   Clinical Impression   Pt admitted for concerns and procedure listed above. PTA pt reported that he was performing all ADL's and mobility at a Mod I level, using a RW. Pt reports that at times the pain would limit him a lot and he would require a little help from his family, otherwise he was independent. At the time of the evaluation, pt was very limited by pain, requiring Mod A +2 for all standing and transfers, as well as bed mobility. When in standing pt was very unstable, most likely due to pain. Pt will benefit from intensive rehab services to assist with progressing pt back to a supervision level in order for him to go home. Acute OT will continue to follow to assist with progressing pt goals and functional mobility/ADL's.     Follow Up Recommendations  CIR    Equipment Recommendations  None recommended by OT    Recommendations for Other Services Rehab consult     Precautions / Restrictions Precautions Precautions: Back Precaution Comments: Unsure if pt still needs brace now that he had kyphoplasty. Put it on until MD can clarify. Required Braces or Orthoses: Spinal Brace Spinal Brace: Thoracolumbosacral orthotic;Applied in sitting position Restrictions Weight Bearing Restrictions: No      Mobility Bed Mobility Overal bed mobility: Needs Assistance Bed Mobility: Rolling;Sidelying to Sit;Sit to Sidelying Rolling: Min assist Sidelying to sit: Min assist     Sit to  sidelying: Min guard;+2 for physical assistance General bed mobility comments: Assist to elevate trunk into sitting and bring hips to EOB. Min +2 to return to side lying to assist with bringing legs into bed and for safety.    Transfers Overall transfer level: Needs assistance Equipment used: Rolling walker (2 wheeled) Transfers: Sit to/from [facility] Sit to Stand: Mod assist;+2 physical assistance;+2 safety/equipment;From elevated surface Stand pivot transfers: Mod assist;+2 physical assistance;+2 safety/equipment;From elevated surface       General transfer comment: Assist to bring hips up and for balance. Bed raised ~6 inches    Balance Overall balance assessment: Needs assistance Sitting-balance support: Bilateral upper extremity supported;Feet supported Sitting balance-Leahy Scale: Poor Sitting balance - Comments: Using UE's likely primarily due to pain   Standing balance support: Bilateral upper extremity supported Standing balance-Leahy Scale: Poor Standing balance comment: walker and min assist for static standing                           ADL either performed or assessed with clinical judgement   ADL Overall ADL's : Needs assistance/impaired Eating/Feeding: Independent;Sitting   Grooming: Wash/dry face;Set up;Sitting Grooming Details (indicate cue type and reason): completed EOB Upper Body Bathing: Minimal assistance;Sitting Upper Body Bathing Details (indicate cue type and reason): Needs assist due to pain with movement Lower Body Bathing: Maximal assistance;Sitting/lateral leans;Sit to/from stand Lower Body Bathing Details (indicate cue type and reason): Needs assistance to stand and remain standing as well as completing pericare Upper Body Dressing : Minimal assistance;Sitting  Upper Body Dressing Details (indicate cue type and reason): Min a due to pain levels with resistance in UE Lower Body Dressing: Moderate assistance;Sitting/lateral  leans;Sit to/from stand Lower Body Dressing Details (indicate cue type and reason): Mod assist to with no bending to get clothes on and over feet and pulled up knees, as well as assist with standing.. Toilet Transfer: Moderate assistance;+2 for physical assistance;+2 for safety/equipment;Stand-pivot Toilet Transfer Details (indicate cue type and reason): Increased pain with transfer, need +2 due to instability and powering up to stand Toileting- Clothing Manipulation and Hygiene: Maximal assistance;Sitting/lateral lean;Sit to/from stand Toileting - Clothing Manipulation Details (indicate cue type and reason): Max A due to pain and difficulty reaching for Pericare, as well as safety concerns with balance when standing. Tub/ Shower Transfer: Moderate assistance;+2 for physical assistance;+2 for safety/equipment;Stand-pivot Tub/Shower Transfer Details (indicate cue type and reason): Increased pain with transfer, need +2 due to instability and powering up to stand Functional mobility during ADLs: Moderate assistance;+2 for physical assistance;+2 for safety/equipment General ADL Comments: Pt lost his balance multiple times when standing, requiring OT and tech to assist with steadying him and the RW. Pt is limited in mobility due to pain and requires mod +2 assist to power up and maintain balance in standing and with mobility.     Vision Baseline Vision/History: Wears glasses Wears Glasses: Reading only Patient Visual Report: No change from baseline Vision Assessment?: No apparent visual deficits     Perception Perception Perception Tested?: No   Praxis Praxis Praxis tested?: Not tested    Pertinent Vitals/Pain Pain Assessment: 0-10 Pain Score: 10-Worst pain ever Pain Location: Hips with movement - reports this is how his back pain presented Pain Descriptors / Indicators: Grimacing;Guarding Pain Intervention(s): Limited activity within patient's tolerance;Monitored during session;Premedicated  before session;Repositioned     Hand Dominance Right   Extremity/Trunk Assessment Upper Extremity Assessment Upper Extremity Assessment: Generalized weakness;RUE deficits/detail;LUE deficits/detail RUE Deficits / Details: Pt unable to complete MMT due to pain, ROM is WFL. LUE Deficits / Details: Pt unable to complete MMT due to pain, ROM is [facility].   Lower Extremity Assessment Lower Extremity Assessment: Defer to PT evaluation   Cervical / Trunk Assessment Cervical / Trunk Assessment: Other exceptions Cervical / Trunk Exceptions: Kyphoplasty at T12-L1   Communication Communication Communication: No difficulties   Cognition Arousal/Alertness: Awake/alert Behavior During Therapy: WFL for tasks assessed/performed Overall Cognitive Status: Within Functional Limits for tasks assessed                                     General Comments  VSS on RA    Exercises     Shoulder Instructions      Home Living Family/patient expects to be discharged to:: Private residence Living Arrangements: Spouse/significant other Available Help at Discharge: Family;Available 24 hours/day Type of Home: House Home Access: Stairs to enter [employer] of Steps: 1 Entrance Stairs-Rails: None Home Layout: One level     Bathroom Shower/Tub: Occupational psychologist: Standard Bathroom Accessibility: Yes How Accessible: Accessible via walker Home Equipment: [place] - single point;Shower seat;Grab bars - tub/shower;Hand held Tourist information centre manager - 2 wheels   Additional Comments: lift chair      Prior Functioning/Environment Level of Independence: Independent with assistive device(s)  Gait / Transfers Assistance Needed: Modified independent with rolling walker ADL's / Homemaking Assistance Needed: Pt can complete all ADL's with Mod I  OT Problem List: Decreased strength;Decreased range of motion;Decreased activity tolerance;Decreased  coordination;Decreased safety awareness;Decreased knowledge of use of DME or AE;Pain      OT Treatment/Interventions: Self-care/ADL training;Therapeutic exercise;Energy conservation;DME and/or AE instruction;Therapeutic activities;Patient/family education;Balance training    OT Goals(Current goals can be found in the care plan section) Acute Rehab OT Goals Patient Stated Goal: return home OT Goal Formulation: With patient Time For Goal Achievement: 02/18/21 Potential to Achieve Goals: Good ADL Goals Pt Will Perform Grooming: with modified independence;standing Pt Will Perform Upper Body Dressing: with modified independence;sitting Pt Will Transfer to Toilet: with modified independence;stand pivot transfer Additional ADL Goal #1: Pt will tolerate standing for 3+ mins to complete grooming activities at the sink.  OT Frequency: Min 2X/week   Barriers to D/C:            Co-evaluation              AM-PAC OT "6 Clicks" Daily Activity     Outcome Measure Help from another person eating meals?: None Help from another person taking care of personal grooming?: A Little Help from another person toileting, which includes using toliet, bedpan, or urinal?: A Lot Help from another person bathing (including washing, rinsing, drying)?: A Lot Help from another person to put on and taking off regular upper body clothing?: A Little Help from another person to put on and taking off regular lower body clothing?: A Lot 6 Click Score: 16   End of Session Equipment Utilized During Treatment: Rolling walker;Back brace Nurse Communication: Mobility status;Other (comment) (Pt's pain level.)  Activity Tolerance: Patient limited by pain Patient left: in bed;with call bell/phone within reach  OT Visit Diagnosis: Unsteadiness on feet (R26.81);Repeated falls (R29.6);Muscle weakness (generalized) (M62.81)                Time: 3570-1779 OT Time Calculation (min): 23 min Charges:  OT General  Charges $OT Visit: 1 Visit OT Evaluation $OT Eval Moderate Complexity: 1 Mod OT Treatments $Self Care/Home Management : 8-22 mins  Caera Enwright H., OTR/L Acute Rehabilitation  Jahara Dail Elane Kyshawn Teal 02/04/2021, 2:22 PM

## 2021-02-04 NOTE — Plan of Care (Signed)

## 2021-02-04 NOTE — TOC Benefit Eligibility Note (Signed)
Transition of Care [facility]) Benefit Eligibility Note    Patient Details  Name: Jack [name] MRN: [id number] Date of Birth: May 09, 1933   Medication/Dose: [name]  2.5 MG BID  Covered?: Yes  Tier: 3 Drug  Prescription Coverage [facility]  [place] with Person/Company/Phone Number:: [id number]   @  [employer] #  [phone]  Co-Pay: 445.00  Prior Approval: No  Deductible: Met  Additional Notes: APIXABAN : [name] Phone Number: 02/04/2021, 12:06 PM

## 2021-02-04 NOTE — TOC Initial Note (Addendum)
Transition of Care [facility]) - Initial/Assessment Note    Patient Details  Name: Jack [name] MRN: [id number] Date of Birth: March 29, 1933  Transition of [facility]) CM/SW Contact:    Jack [name], [place] Phone Number: 02/04/2021, 10:05 AM  Clinical Narrative:          Update- CSW started insurance authorization for patient. CSW requested PTAR authorization.SNF choice pending. Insurance authorization pending. PTAR approval pending.CSW will continue to follow and assist with discharge planning needs.  CSW received consult for possible SNF placement at time of discharge. CSW spoke with patient at beside regarding PT recommendation of SNF placement at time of discharge. Patient comes from home with spouse.Patient expressed understanding of PT recommendation and is agreeable to SNF placement at time of discharge. Patient reports preference for [facility] as first choice and [facility] as second choice  Patient gave CSW permission to fax out initial referral near [place] and [place] area.  Patient has received the COVID vaccines as well as booster. No further questions reported at this time. CSW to continue to follow and assist with discharge planning needs.           Expected Discharge Plan: Skilled Nursing Facility Barriers to Discharge: Continued Medical Work up   Patient Goals and CMS Choice Patient states their goals for this hospitalization and ongoing recovery are:: to go to SNF CMS Medicare.gov Compare Post Acute Care list provided to:: Patient Choice offered to / list presented to : Patient  Expected Discharge Plan and Services Expected Discharge Plan: [place] In-house Referral: Clinical Social Work     Living arrangements for the past 2 months: [place]                                      Prior Living Arrangements/Services Living arrangements for the past 2 months: Single Family Home Lives with:: Self,Spouse Patient language  and need for interpreter reviewed:: Yes Do you feel safe going back to the place where you live?: No   SNF  Need for Family Participation in Patient Care: Yes (Comment) Care giver support system in place?: Yes (comment)   Criminal Activity/Legal Involvement Pertinent to Current Situation/Hospitalization: No - Comment as needed  Activities of Daily Living Home Assistive Devices/Equipment: Walker (specify type) ADL Screening (condition at time of admission) Patient's cognitive ability adequate to safely complete daily activities?: Yes Is the patient deaf or have difficulty hearing?: No Does the patient have difficulty seeing, even when wearing glasses/contacts?: No Does the patient have difficulty concentrating, remembering, or making decisions?: No Patient able to express need for assistance with ADLs?: Yes Does the patient have difficulty dressing or bathing?: No Independently performs ADLs?: Yes (appropriate for developmental age) Does the patient have difficulty walking or climbing stairs?: Yes Weakness of Legs: Both Weakness of Arms/Hands: None  Permission Sought/Granted Permission sought to share information with : Case Manager,Family Chief Financial Officer Permission granted to share information with : Yes, Verbal Permission Granted  Share Information with NAME: Jack [name]  Permission granted to share info w AGENCY: SNF  Permission granted to share info w Relationship: spouse  Permission granted to share info w Contact Information: Jack [name]  Emotional Assessment Appearance:: Appears stated age Attitude/Demeanor/Rapport: Gracious Affect (typically observed): Calm Orientation: : Oriented to Self,Oriented to Place,Oriented to  Time,Oriented to Situation Alcohol / Substance Use: Not Applicable Psych Involvement: No (comment)  Admission diagnosis:  Back  pain [M54.9] T12 compression fracture ([place]) [N18.335O] Patient Active Problem List   Diagnosis Date Noted  .  Protein-calorie malnutrition, severe 01/31/2021  . Hypokalemia   . Anemia   . Atherosclerosis of aorta ([place])   . Irregular heart rhythm   . T12 compression fracture ([place]) 01/02/2021  . History of kidney stones   . Acute kidney injury superimposed on CKD ([place])   . Ureteral stone   . Ureteral perforation secondary to stent manipulation 10/16/2017  . Abdominal pain 10/16/2017  . Multinodular goiter 07/03/2014  . Thyroid nodule 06/20/2014  . Edema 09/24/2013  . Encounter for preventive health examination 08/21/2013  . Personal history of colonic polyps 08/21/2013  . History of venomous snake bite 08/21/2013  . Cough 10/02/2012  . Prostate hypertrophy   . Elevated prostate specific antigen (PSA)   . HYPERTRIGLYCERIDEMIA 12/21/2006  . Essential hypertension 12/21/2006  . GERD 12/21/2006  . CALCULUS, KIDNEY 12/21/2006   PCP:  [name], MD Pharmacy[facility]   [id number] [place], [place] [address] [place] [address] Phone: [phone] Fax: [phone]  [facility] [address] [place] [address] Phone: [phone] Fax: [phone]     Social Determinants of Health (SDOH) Interventions    Readmission Risk Interventions No flowsheet data found.

## 2021-02-04 NOTE — NC FL2 (Signed)
[place] MEDICAID FL2 LEVEL OF CARE SCREENING TOOL     IDENTIFICATION  Patient Name: Jack [name] Birthdate: 1933-09-12 Sex: male Admission Date (Current Location): 01/29/2021  [facility] and [place] Number:  Herbalist and Address:  [facility] [place], [place] [address], [place], [place] [address]      Provider Number: [id number]  Attending Physician Name and Address:  [place] Eric J, DO  Relative Name and Phone Number:  [name] [phone]    Current Level of Care: Hospital Recommended Level of Care: [place] Prior Approval Number:    Date Approved/Denied:   PASRR Number: [id number]  Discharge Plan: SNF    Current Diagnoses: Patient Active Problem List   Diagnosis Date Noted  . Protein-calorie malnutrition, severe 01/31/2021  . Hypokalemia   . Anemia   . Atherosclerosis of aorta ([place])   . Irregular heart rhythm   . T12 compression fracture ([place]) 01/02/2021  . History of kidney stones   . Acute kidney injury superimposed on CKD ([place])   . Ureteral stone   . Ureteral perforation secondary to stent manipulation 10/16/2017  . Abdominal pain 10/16/2017  . Multinodular goiter 07/03/2014  . Thyroid nodule 06/20/2014  . Edema 09/24/2013  . Encounter for preventive health examination 08/21/2013  . Personal history of colonic polyps 08/21/2013  . History of venomous snake bite 08/21/2013  . Cough 10/02/2012  . Prostate hypertrophy   . Elevated prostate specific antigen (PSA)   . HYPERTRIGLYCERIDEMIA 12/21/2006  . Essential hypertension 12/21/2006  . GERD 12/21/2006  . CALCULUS, KIDNEY 12/21/2006    Orientation RESPIRATION BLADDER Height & Weight     Self,Time,Situation,Place  Normal Continent,External catheter (External Urinary Catheter) Weight: 182 lb 12.2 oz (82.9 kg) Height:  5\' 9"  (175.3 cm)  BEHAVIORAL SYMPTOMS/MOOD NEUROLOGICAL BOWEL NUTRITION STATUS      Continent Diet (See Discharge Summary)  AMBULATORY  STATUS COMMUNICATION OF NEEDS Skin   Limited Assist Verbally Other (Comment) (Wound/Incision (Open or Dehiced) puncture back medial;Left,Kypho site; transparent dressing daily,clean,dry)                       Personal Care Assistance Level of Assistance  Bathing,Feeding,Dressing Bathing Assistance: Limited assistance Feeding assistance: Independent Dressing Assistance: Limited assistance     Functional Limitations Info  Sight,Hearing,Speech Sight Info: Impaired Hearing Info: Adequate Speech Info: Adequate    SPECIAL CARE FACTORS FREQUENCY  PT (By licensed PT),OT (By licensed OT)     PT Frequency: 5x min weekly OT Frequency: 5x min weekly            Contractures Contractures Info: Not present    Additional Factors Info  Code Status,Allergies Code Status Info: FULL Allergies Info: Amlodipine,Azithromycin,Lisinopril           Current Medications (02/04/2021):  This is the current hospital active medication list Current Facility-Administered Medications  Medication Dose Route Frequency Provider Last Rate Last Admin  . 0.9 %  sodium chloride infusion   Intravenous Continuous Elgergawy, Silver Huguenin, MD 75 mL/hr at 02/03/21 2015 New Bag at 02/03/21 2015  . amiodarone (PACERONE) tablet 400 mg  400 mg Oral BID Geralynn Rile, MD   400 mg at 02/04/21 0950   Followed by  . [START ON 02/07/2021] amiodarone (PACERONE) tablet 200 mg  200 mg Oral Daily O'Neal, Cassie Freer, MD      . apixaban [name]) tablet 2.5 mg  2.5 mg Oral BID Einar Grad, RPH   2.5 mg  at 02/04/21 0951  . atorvastatin (LIPITOR) tablet 40 mg  40 mg Oral q1800 Rise Patience, MD   40 mg at 02/03/21 1848  . bisacodyl (DULCOLAX) suppository 10 mg  10 mg Rectal Once Elgergawy, Silver Huguenin, MD      . bisacodyl (DULCOLAX) suppository 10 mg  10 mg Rectal BID Elgergawy, Silver Huguenin, MD   10 mg at 02/03/21 2144  . calcium-vitamin D (OSCAL WITH D) 500-200 MG-UNIT per tablet 2 tablet  2 tablet Oral BID WC  Elgergawy, Silver Huguenin, MD   2 tablet at 02/04/21 0951  . docusate sodium (COLACE) capsule 100 mg  100 mg Oral BID Rise Patience, MD   100 mg at 02/04/21 0950  . feeding supplement (ENSURE ENLIVE / ENSURE PLUS) liquid 237 mL  237 mL Oral TID BM Elgergawy, Silver Huguenin, MD   237 mL at 02/04/21 0953  . hydrALAZINE (APRESOLINE) injection 10 mg  10 mg Intravenous Q4H PRN Elgergawy, Silver Huguenin, MD   10 mg at 02/02/21 1851  . hydrALAZINE (APRESOLINE) tablet 75 mg  75 mg Oral Q6H Elgergawy, Silver Huguenin, MD   75 mg at 02/04/21 0554  . metoprolol tartrate (LOPRESSOR) tablet 25 mg  25 mg Oral BID Elgergawy, Silver Huguenin, MD   25 mg at 02/04/21 0950  . morphine 2 MG/ML injection 2 mg  2 mg Intravenous Q4H PRN Elgergawy, Silver Huguenin, MD   2 mg at 02/04/21 0950  . ondansetron (ZOFRAN) injection 4 mg  4 mg Intravenous Q6H PRN Elgergawy, Silver Huguenin, MD   4 mg at 02/04/21 0554  . polyethylene glycol (MIRALAX / GLYCOLAX) packet 17 g  17 g Oral BID Elgergawy, Silver Huguenin, MD   17 g at 02/03/21 2145  . tamsulosin (FLOMAX) capsule 0.4 mg  0.4 mg Oral Daily Rise Patience, MD   0.4 mg at 02/04/21 0951  . traMADol (ULTRAM) tablet 50 mg  50 mg Oral Q6H PRN Elgergawy, Silver Huguenin, MD   50 mg at 02/03/21 1442     Discharge Medications: Please see discharge summary for a list of discharge medications.  Relevant Imaging Results:  Relevant Lab Results:   Additional Information [id number]  Chanse Kagel B Modest Draeger, LCSWA

## 2021-02-04 NOTE — Progress Notes (Signed)
Jack [name] came to Radiology on 02/02/21 for a kyphoplasty with Dr. Estanislado Pandy.  We were informed of Jack [name] bilateral rib pain after his procedure that occurred as he was getting from his bed to the procedure table. Jack [name] was wearing a back brace prior to the procedure and he removed it and rolled himself into the prone position onto the table with very little assistance from the staff. We then gave him have him cushions for his elbows and head and secured him with a seatbelt to ensure safety. Patient didn't express any discomfort in the prone position while in the procedure room.   To follow up with the patients concerns about his severe discomfort and pain during the procedure, Duard Larsen RN and I went to the patients room.  We introduced ourselves to Jack [name] and his daughter.  He expressed that the roll from his bed to the table was very painful and the mattress on the table was very thin and offered no support.  He suggested that in the future we could offer an additional cushion under the rib area.  Jack [name] did not believe that we were responsible for his pain, he just feels that the table and the positioning was overall very uncomfortable. [name] and his daughter did not have anymore questions or concerns for [name]. We very much enjoyed talking with them and let them know that we are available for any additional concerns they may have.

## 2021-02-04 NOTE — Progress Notes (Signed)
PROGRESS NOTE    Jack [name]  NUU:[id number] DOB: Aug 18, 1933 DOA: 01/29/2021 PCP: [name], MD    Brief Narrative:  Jack [name] is an 87 year old male with past medical history significant for essential hypertension, CKD stage IIIb, anemia who presented to [facility] ED on 5/19 with recurrent/worsening low back pain over the past 3 days.  Patient denies any radiation of pain, no associated incontinence of urine/bowel.  Patient recently admitted at [facility] for T12 compression fracture subsequently discharged to rehab.  While in rehab for 2 weeks, he was discharged to home and doing well with the TLSO brace until now.   In the ED, MR with T12 and new L1 compression fracture.  Neurosurgery was consulted.  TRH consulted for further evaluation and management of acute pain related to subacute and acute thoracic/lumbar compression fracture.     Assessment & Plan:   Principal Problem:   T12 compression fracture (HCC) Active Problems:   Protein-calorie malnutrition, severe   T12/L1 compression fracture Patient presenting to ED from home with recurrent low back pain.  Recent T12 compression fracture treated conservatively with TLSO brace and recent discharge home from short-term rehab.  Now with recurrent low back pain without radiation or neurologic symptoms.  No trauma/falls.  CT T-spine with mild interval progression of previously demonstrated T12 compression fracture now with 60% height loss, associated disc bulge T11--12, no other thoracic spine fracture identified. MR T/L-spine without contrast with compression fracture T12/L1 with approximately 50% and 25% height loss respectively with mild retropulsion with no associated stenosis.  Patient was evaluated by neurosurgery with recommendations of kyphoplasty.  Interventional radiology was consulted and patient underwent T12/L1 kyphoplasty on 5/23 2022. --continue TSLO brace --continue PT/OT  efforts --Tramadol 50 mg PO q6h prn moderate pain --Morphine 2 mg IV q4h prn severe pain --Robaxin 500mg  PO 16h prn muscle spasms  Suspected left fifth rib fracture While transitioning from the stretcher to the IR table, patient reported pain to left flank/chest region.  Rib series concerning for suspicion of left anterior lateral fifth rib fracture, no adjacent pneumothorax evident. --Pain control --Incentive spirometry --Bilateral rib series 7-10 days if patient's symptoms persist  Essential hypertension Patient previously on ARB and HCTZ which was recently discontinued due to worsening renal function. --change Metoprolol to carvedilol 12.5 mg p.o. twice daily --Hydralazine 100mg  PO TID --continue to monitor BP and adjust accordingly  Atrial fibrillation with RVR Initially required Cardizem drip, then converted to normal sinus rhythm.  Cardiology was consulted and followed during hospital course. --Amiodarone 400 mg p.o. twice daily x7 days, followed by 200 mg p.o. daily x21 days, then will discontinue --Eliquis 2.5mg  PO BID  Acute renal failure on CKD stage IIIb Baseline creatinine 1.6-2.0.  Recently discontinued ARB and HCTZ due to worsening renal function.  Patient with poor oral intake in the setting of procedures and pain. --Cr 1.66>1.58>>2.21>2.46>2.35 --continue IVF hydration with NS at 57mL/hr --BMP in am  Hyperlipidemia: atorvastatin 40mg  PO daily  BPH: Tamsulosin 0.4 mg p.o. daily  Severe protein calorie malnutrition Body mass index is 26.99 kg/m.  Nutrition Status: Nutrition Problem: Severe Malnutrition Etiology: acute illness (T12 and L1 compression fractures) Signs/Symptoms: moderate fat depletion,moderate muscle depletion,percent weight loss (12.7% weight loss in less than 2 months) Percent weight loss: 12.7 % Interventions: Ensure Enlive (each supplement provides 350kcal and 20 grams of protein),Magic cup,Liberalize Diet --Dietitian following, appreciate  assistance.  Continue to encourage increased oral intake, supplement use.  Constipation Received enema yesterday  with improvement of symptoms. -- Senokot-S 1 tablet p.o. twice daily   DVT prophylaxis: Eliquis   Code Status: Full Code Family Communication: No family present at bedside  Disposition Plan:  Level of care: Telemetry Cardiac Status is: Inpatient  Remains inpatient appropriate because:Unsafe d/c plan, IV treatments appropriate due to intensity of illness or inability to take PO and Inpatient level of care appropriate due to severity of illness   Dispo: The patient is from: Home              Anticipated d/c is to: SNF              Patient currently is not medically stable to d/c.   Difficult to place patient No   Consultants:   Neurosurgery  Interventional radiology  Cardiology  Procedures:   Kyphoplasty T12/L1 5/23  Antimicrobials:   None   Subjective: Patient seen and examined at bedside, resting comfortably in bedside chair.  Continues with moderate pain.  Reports that he recently did well at short-term rehabilitation and is interested in returning.  Underwent kyphoplasty 2 days ago.  Continues with right-sided rib discomfort.  No other complaints or concerns at this time.  Specifically denies headache, no dizziness, no current chest pain, no palpitations, no abdominal pain, no nausea/vomiting/diarrhea, no weakness/paresthesias, no cough/congestion.  No acute concerns overnight per nursing staff.  Objective: Vitals:   02/04/21 0500 02/04/21 0843 02/04/21 1429 02/04/21 1449  BP: (!) 163/90 (!) 161/96 (!) 172/92 (!) 173/93  Pulse: 64 86 68 67  Resp: 14 19 18 17   Temp: 98.3 F (36.8 C) 97.6 F (36.4 C)    TempSrc: Oral Oral    SpO2: 94% 96% 92% 93%  Weight:      Height:        Intake/Output Summary (Last 24 hours) at 02/04/2021 1738 Last data filed at 02/04/2021 1634 Gross per 24 hour  Intake 3361.52 ml  Output 850 ml  Net 2511.52 ml   Filed  Weights   01/30/21 0529  Weight: 82.9 kg    Examination:  General exam: Appears calm and comfortable  Respiratory system: Clear to auscultation. Respiratory effort normal.  On room air Cardiovascular system: S1 & S2 heard, RRR. No JVD, murmurs, rubs, gallops or clicks. No pedal edema. Gastrointestinal system: Abdomen is nondistended, soft and nontender. No organomegaly or masses felt. Normal bowel sounds heard. Central nervous system: Alert and oriented. No focal neurological deficits. Extremities: Symmetric 5 x 5 power.  TSLO brace noted Skin: No rashes, lesions or ulcers Psychiatry: Judgement and insight appear normal. Mood & affect appropriate.     Data Reviewed: I have personally reviewed following labs and imaging studies  CBC: Recent Labs  Lab 01/29/21 1425 01/30/21 0211 01/31/21 0138 02/01/21 0228 02/02/21 0355 02/03/21 0207 02/04/21 0141  WBC 4.1   < > 5.1 5.3 4.7 5.2 4.1  NEUTROABS 3.0  --   --   --   --   --   --   HGB 11.1*   < > 11.6* 11.4* 11.1* 11.6* 10.0*  HCT 34.2*   < > 35.5* 35.1* 34.9* 35.9* 30.8*  MCV 93.7   < > 91.7 93.4 94.3 94.2 94.2  PLT 219   < > 242 232 228 238 229   < > = values in this interval not displayed.   Basic Metabolic Panel: Recent Labs  Lab 01/31/21 0138 02/01/21 0228 02/02/21 0355 02/03/21 0207 02/04/21 0141  NA 139 139 138 141 139  K 3.8  4.0 4.4 4.3 4.0  CL 102 102 102 104 104  CO2 26 28 28 28 28   GLUCOSE 127* 115* 110* 122* 112*  BUN 22 27* 29* 26* 32*  CREATININE 1.68* 1.79* 2.21* 2.46* 2.35*  CALCIUM 10.3 10.3 10.5* 10.5* 10.4*  MG 2.4  --   --   --   --    GFR: Estimated Creatinine Clearance: 23 mL/min (A) (by C-G formula based on SCr of 2.35 mg/dL (H)). Liver Function Tests: Recent Labs  Lab 01/29/21 1425  AST 17  ALT 11  ALKPHOS 104  BILITOT 0.8  PROT 6.1*  ALBUMIN 3.5   No results for input(s): LIPASE, AMYLASE in the last 168 hours. No results for input(s): AMMONIA in the last 168 hours. Coagulation  Profile: Recent Labs  Lab 01/31/21 0138  INR 1.2   Cardiac Enzymes: No results for input(s): CKTOTAL, CKMB, CKMBINDEX, TROPONINI in the last 168 hours. BNP (last 3 results) No results for input(s): PROBNP in the last 8760 hours. HbA1C: No results for input(s): HGBA1C in the last 72 hours. CBG: No results for input(s): GLUCAP in the last 168 hours. Lipid Profile: No results for input(s): CHOL, HDL, LDLCALC, TRIG, CHOLHDL, LDLDIRECT in the last 72 hours. Thyroid Function Tests: No results for input(s): TSH, T4TOTAL, FREET4, T3FREE, THYROIDAB in the last 72 hours. Anemia Panel: No results for input(s): VITAMINB12, FOLATE, FERRITIN, TIBC, IRON, RETICCTPCT in the last 72 hours. Sepsis Labs: No results for input(s): PROCALCITON, LATICACIDVEN in the last 168 hours.  Recent Results (from the past 240 hour(s))  Surgical pcr screen     Status: None   Collection Time: 01/30/21  5:20 AM   Specimen: Nasal Mucosa; Nasal Swab  Result Value Ref Range Status   MRSA, PCR NEGATIVE NEGATIVE Final   Staphylococcus aureus NEGATIVE NEGATIVE Final    Comment: (NOTE) The Xpert SA Assay (FDA approved for NASAL specimens in patients 64 years of age and older), is one component of a comprehensive surveillance program. It is not intended to diagnose infection nor to guide or monitor treatment. Performed at [facility], [address], [place], [place] [address]          Radiology Studies: DG Ribs Bilateral  Result Date: 02/03/2021 CLINICAL DATA:  Pain. EXAM: BILATERAL RIBS - 3+ VIEW COMPARISON:  Chest x-ray 02/02/2021. FINDINGS: Nondisplaced left anterolateral fifth rib fracture cannot be completely excluded. Prior thoracolumbar vertebroplasties. Calcifications noted over the left lung most likely calcified granulomas. Bibasilar atelectasis. IMPRESSION: Nondisplaced left anterolateral fifth rib fracture cannot be completely excluded. No evidence of pneumothorax. Electronically Signed   By:  Marcello Moores  Register   On: 02/03/2021 05:18   DG Chest Port 1 View  Result Date: 02/02/2021 CLINICAL DATA:  Rib pain after kyphoplasty today. EXAM: PORTABLE CHEST 1 VIEW COMPARISON:  Radiograph 01/10/2013, abdominal CT 01/29/2021 FINDINGS: Elevated right hemidiaphragm, with progression from prior exam. The heart is normal in size. Smooth prominence of the right mediastinum is likely due to aortic tortuosity. No pneumothorax. Minor bibasilar atelectasis. No confluent consolidation. No pleural effusion. Kyphoplasty not visualized on the current exam. No evidence of kyphoplasty cement in the thoracic vessels. No evidence of rib fracture. IMPRESSION: 1. Elevated right hemidiaphragm, progressed from prior exam. Minor bibasilar atelectasis. 2. Smooth prominence of the right mediastinal border is likely due to aortic tortuosity. The ascending aorta is dilated on included portion from recent abdominal CT consistent with ascending aortic aneurysm. Recommend nonemergent chest CT to assess the entire thoracic aorta. Electronically  Signed   By: Keith Rake M.D.   On: 02/02/2021 20:09        Scheduled Meds: . amiodarone  400 mg Oral BID   Followed by  . [START ON 02/07/2021] amiodarone  200 mg Oral Daily  . apixaban  2.5 mg Oral BID  . atorvastatin  40 mg Oral q1800  . bisacodyl  10 mg Rectal Once  . bisacodyl  10 mg Rectal BID  . calcium-vitamin D  2 tablet Oral BID WC  . docusate sodium  100 mg Oral BID  . feeding supplement  237 mL Oral TID BM  . hydrALAZINE  75 mg Oral Q6H  . metoprolol tartrate  25 mg Oral BID  . polyethylene glycol  17 g Oral BID  . senna-docusate  1 tablet Oral BID  . tamsulosin  0.4 mg Oral Daily   Continuous Infusions:   LOS: 5 days    Time spent: 45 minutes spent on chart review, discussion with nursing staff, consultants, updating family and interview/physical exam; more than 50% of that time was spent in counseling and/or coordination of care.    Grier Czerwinski J [place]  DO [facility] Available via Epic secure chat 7am-7pm After these hours, please refer to coverage provider listed on [email or web address] 02/04/2021, 5:38 PM

## 2021-02-04 NOTE — Progress Notes (Signed)
Physical Therapy Treatment Patient Details Name: Jack [name] MRN: [id number] DOB: Mar 05, 1933 Today's Date: 02/04/2021    History of Present Illness Pt is an 87 y.o. male admitted 01/29/21 with worsening back pain; pt has known T12 compression fx that was being treated conservatively. MRI showed worsening T12 compression fx, new L1 compression fx. Pt developed afib with RVR on 5/20. S/p kyphoplasty fo T12 and L1 on 5/23. Pt developed rib pain after procedure; imaging with possible L 5th rib fx. PMH includes HTN, CKD.   PT Comments    Pt progressing well with mobility; motivated to participate despite pain and fatigue. Today's session focused on transfer and gait training with RW, as well as seated LE therex/ROM. Pt requires min-modA for brief bouts of activity with RW. Pt remains limited by significant pain, weakness, decreased activity tolerance and impaired balance strategies/postural reactions. Continue to recommend SNF-level therapies to maximize functional mobility and independence prior to return home; pt and wife in agreement.    Follow Up Recommendations  SNF;Supervision for mobility/OOB     Equipment Recommendations  None recommended by PT    Recommendations for Other Services       Precautions / Restrictions Precautions Precautions: Back;Fall Required Braces or Orthoses: Spinal Brace Spinal Brace: Thoracolumbosacral orthotic;Applied in sitting position Restrictions Weight Bearing Restrictions: No    Mobility  Bed Mobility Overal bed mobility: Needs Assistance Bed Mobility: Sidelying to Sit   Sidelying to sit: Min guard;HOB elevated       General bed mobility comments: Received sidelying on R-side, requesting HOB elevated for attempts to sit EOB; pt able to come to sit from sidelying with min guard, heavy use of bed rail to push into sitting, increased time and effort secondary to pain    Transfers Overall transfer level: Needs assistance Equipment used:  Rolling walker (2 wheeled) Transfers: Sit to/from Stand Sit to Stand: Min assist;From elevated surface         General transfer comment: Initial attempt to stand unsuccessful with LOB and return to sit; able to stand to RW on second trial, requiring minA to stabilize RW as pt pulling on it with both hands; min guard for trunk elevation; cues for correct hand placement, especially if standing from lower surface height  Ambulation/Gait Ambulation/Gait assistance: Min assist;Mod assist Gait Distance (Feet): 16 Feet Assistive device: Rolling walker (2 wheeled) Gait Pattern/deviations: Step-through pattern;Decreased stride length;Staggering left;Staggering right;Narrow base of support;Antalgic Gait velocity: Decreased   General Gait Details: Slow, unsteady gait with RW and intermittent min-modA to maintain balance; pt with bilateral knee instability noted, suspect more related to hip/sacral/back pain; seated rest secondary to pain and fatigue   Stairs             Wheelchair Mobility    Modified Rankin (Stroke Patients Only)       Balance Overall balance assessment: Needs assistance Sitting-balance support: Bilateral upper extremity supported;Feet supported Sitting balance-Leahy Scale: Fair Sitting balance - Comments: Able to assist with brace application while seated EOB, but typically prefers to have BUE support on EOB (suspect more related to pain than need for balance)     Standing balance-Leahy Scale: Poor Standing balance comment: Heavy reliance on UE support                            Cognition Arousal/Alertness: Awake/alert Behavior During Therapy: WFL for tasks assessed/performed Overall Cognitive Status: Within Functional Limits for tasks assessed  General Comments: Motivated to participate despite significant pain      Exercises General Exercises - Lower Extremity Long Arc Quad: AROM;Both;Seated Hip  Flexion/Marching: AROM;Both;Seated Toe Raises: AROM;Both;Seated Heel Raises: AROM;Both;Seated    General Comments General comments (skin integrity, edema, etc.): VSS on RA. Pt's wife present and supportived; discussed plan for d/c to SNF for post-acute rehab prior to return home      Pertinent Vitals/Pain Pain Assessment: Faces Faces Pain Scale: Hurts whole lot Pain Location: Lower back, bilateral posterior hips/sacrum Pain Descriptors / Indicators: Grimacing;Guarding Pain Intervention(s): Limited activity within patient's tolerance;Monitored during session;Repositioned    Home Living                      Prior Function            PT Goals (current goals can now be found in the care plan section) Acute Rehab PT Goals Patient Stated Goal: Plans for post-acute rehab at SNF before return home PT Goal Formulation: With patient/family Time For Goal Achievement: 02/17/21 Potential to Achieve Goals: Good Progress towards PT goals: Progressing toward goals    Frequency    Min 3X/week      PT Plan Current plan remains appropriate    Co-evaluation              AM-PAC PT "6 Clicks" Mobility   Outcome Measure  Help needed turning from your back to your side while in a flat bed without using bedrails?: A Little Help needed moving from lying on your back to sitting on the side of a flat bed without using bedrails?: A Lot Help needed moving to and from a bed to a chair (including a wheelchair)?: A Lot Help needed standing up from a chair using your arms (e.g., wheelchair or bedside chair)?: A Lot Help needed to walk in hospital room?: A Little Help needed climbing 3-5 steps with a railing? : A Lot 6 Click Score: 14    End of Session Equipment Utilized During Treatment: Gait belt;Back brace Activity Tolerance: Patient tolerated treatment well;Patient limited by pain Patient left: in chair;with call bell/phone within reach;with family/visitor present Nurse  Communication: Mobility status (RN agrees that pt does not require chair alarm; pt & wife aware to use call bell when ready to get up) PT Visit Diagnosis: Other abnormalities of gait and mobility (R26.89);Muscle weakness (generalized) (M62.81);Pain     Time: 2585-2778 PT Time Calculation (min) (ACUTE ONLY): 36 min  Charges:  $Therapeutic Activity: 23-37 mins                     Mabeline Caras, PT, DPT Acute Rehabilitation Services  Pager [phone] Office [place] 02/04/2021, 5:43 PM

## 2021-02-04 NOTE — Care Management Important Message (Signed)
Important Message  Patient Details  Name: Jack [name] MRN: [id number] Date of Birth: 1932-11-04   Medicare Important Message Given:  Yes     Shelda Altes 02/04/2021, 11:23 AM

## 2021-02-04 NOTE — Care Management (Signed)
1021 02-04-21 Benefits check submitted for Eliquis. Case Manager will follow for cost. Graves-Bigelow, Ocie Cornfield, RN,BSN Case Manager

## 2021-02-05 ENCOUNTER — Other Ambulatory Visit (HOSPITAL_COMMUNITY): Payer: Self-pay

## 2021-02-05 ENCOUNTER — Ambulatory Visit: Payer: PPO | Admitting: Medical

## 2021-02-05 DIAGNOSIS — S22080D Wedge compression fracture of T11-T12 vertebra, subsequent encounter for fracture with routine healing: Secondary | ICD-10-CM | POA: Diagnosis not present

## 2021-02-05 LAB — BASIC METABOLIC PANEL
Anion gap: 7 (ref 5–15)
BUN: 32 mg/dL — ABNORMAL HIGH (ref 8–23)
CO2: 29 mmol/L (ref 22–32)
Calcium: 11.4 mg/dL — ABNORMAL HIGH (ref 8.9–10.3)
Chloride: 104 mmol/L (ref 98–111)
Creatinine, Ser: 2.3 mg/dL — ABNORMAL HIGH (ref 0.61–1.24)
GFR, Estimated: 27 mL/min — ABNORMAL LOW (ref >60)
Glucose, Bld: 109 mg/dL — ABNORMAL HIGH (ref 70–99)
Potassium: 4.2 mmol/L (ref 3.5–5.1)
Sodium: 140 mmol/L (ref 135–145)

## 2021-02-05 LAB — CBC
HCT: 31.1 % — ABNORMAL LOW (ref 39.0–52.0)
Hemoglobin: 10.1 g/dL — ABNORMAL LOW (ref 13.0–17.0)
MCH: 30.4 pg (ref 26.0–34.0)
MCHC: 32.5 g/dL (ref 30.0–36.0)
MCV: 93.7 fL (ref 80.0–100.0)
Platelets: 226 10*3/uL (ref 150–400)
RBC: 3.32 MIL/uL — ABNORMAL LOW (ref 4.22–5.81)
RDW: 14.8 % (ref 11.5–15.5)
WBC: 3.6 10*3/uL — ABNORMAL LOW (ref 4.0–10.5)
nRBC: 0 % (ref 0.0–0.2)

## 2021-02-05 MED ORDER — BOOST / RESOURCE BREEZE PO LIQD CUSTOM
1.0000 | ORAL | Status: DC
  Administered 2021-02-05 – 2021-02-06 (×2): 1 via ORAL

## 2021-02-05 MED ORDER — CALCITONIN (SALMON) 200 UNIT/ACT NA SOLN
1.0000 | Freq: Every day | NASAL | Status: DC
  Administered 2021-02-05 – 2021-02-06 (×2): 1 via NASAL
  Filled 2021-02-05 (×2): qty 3.7

## 2021-02-05 MED ORDER — ENSURE ENLIVE PO LIQD
237.0000 mL | Freq: Two times a day (BID) | ORAL | Status: DC
  Administered 2021-02-05 – 2021-02-07 (×3): 237 mL via ORAL

## 2021-02-05 MED ORDER — SENNOSIDES-DOCUSATE SODIUM 8.6-50 MG PO TABS
2.0000 | ORAL_TABLET | Freq: Two times a day (BID) | ORAL | Status: DC
  Administered 2021-02-05 – 2021-02-07 (×4): 2 via ORAL
  Filled 2021-02-05 (×5): qty 2

## 2021-02-05 MED ORDER — CARVEDILOL 6.25 MG PO TABS
6.2500 mg | ORAL_TABLET | Freq: Two times a day (BID) | ORAL | Status: DC
  Administered 2021-02-05 – 2021-02-07 (×4): 6.25 mg via ORAL
  Filled 2021-02-05 (×4): qty 1

## 2021-02-05 MED ORDER — ACETAMINOPHEN 500 MG PO TABS
1000.0000 mg | ORAL_TABLET | Freq: Four times a day (QID) | ORAL | Status: DC
  Administered 2021-02-05 – 2021-02-07 (×8): 1000 mg via ORAL
  Filled 2021-02-05 (×8): qty 2

## 2021-02-05 MED ORDER — LIDOCAINE 5 % EX PTCH
1.0000 | MEDICATED_PATCH | CUTANEOUS | Status: DC
  Administered 2021-02-05: 1 via TRANSDERMAL
  Filled 2021-02-05 (×2): qty 1

## 2021-02-05 MED ORDER — HYDRALAZINE HCL 50 MG PO TABS
50.0000 mg | ORAL_TABLET | Freq: Three times a day (TID) | ORAL | Status: DC
  Administered 2021-02-05 – 2021-02-06 (×2): 50 mg via ORAL
  Filled 2021-02-05 (×2): qty 1

## 2021-02-05 NOTE — TOC Benefit Eligibility Note (Signed)
Patient Advocate Encounter  Insurance verification completed.    The patient is currently admitted and upon discharge could be taking Eliquis 5 mg.  The current 30 day co-pay is, $45.00.   The patient is currently admitted and upon discharge could be taking Xarelto 20 mg.  The current 30 day co-pay is, $45.00.   The patient is insured through [employer] Medicare Part D    Mizuki Hoel, CPhT Pharmacy Patient Advocate Specialist [place] [facility] Stewardship Team Direct Number: [phone]  Fax: [phone]        

## 2021-02-05 NOTE — Progress Notes (Signed)
Palliative care consult received for pain management and advance care planning. I have reviewed his chart in detail and have placed the following orders until our team can provide full consultation and meet with patient and his wife:  Current Medical Problems:  1. Acute Compression Fracture, pain uncontrolled 2. Non-Traumatic Rib Fracture, acute 3. Unintentional Weight Loss 4. Weakness and Fatigue 5. Hypercalcemia 6. Renal Insufficiency, Protienuria   Scheduled 1000mg  of Tylenol every 6 hours.  Lidoderm patch to painful rib area.  Noted hypercalcemia of 11.4 today and his albumin is upper limits of normal which is unusual for malnutrition state and 12% weight loss in 2 months. Stopped calcium supplementation. Started Calcitonin which will also help with pain. He is also bradycardic in setting of hypercalcemia. Recommend further medical work up of hypercalcemia, protein, bone fractures, proteinuria.  Full Consultation pending. Discharge plan is for SNF-Rehab.  Lane Hacker, DO Palliative Medicine

## 2021-02-05 NOTE — Progress Notes (Signed)
Paged cardiology concerning heart rate maintaining in the 50's. Heart rate is currently 52. Help hydralazine after discussing with Dr. [place] Waiting for response.

## 2021-02-05 NOTE — TOC Progression Note (Signed)
Transition of Care [facility]) - Progression Note    Patient Details  Name: Jack [name] MRN: [id number] Date of Birth: [birth year]  Transition of Care [facility]) CM/SW Contact  Wandra Feinstein [place], [place] Phone Number: 02/05/2021, 3:06 PM  Clinical Narrative:   Updated pt's wife re current SNF offers. Pt's preferred facilities [facility]) not able to offer due to bed availability. Pt's wife has accepted offer from [facility]. Spoke to [employer] CM who reports their Medical Director is requesting a P2P which can be completed by calling Dr. Coralie Carpen [phone]. Attending MD updated. SW will assist as indicated.   Wandra Feinstein, MSW, LCSW [phone] (coverage)       Expected Discharge Plan: [place] Barriers to Discharge: Continued Medical Work up  Expected Discharge Plan and Services Expected Discharge Plan: [place] In-house Referral: Clinical Social Work     Living arrangements for the past 2 months: Single Family Home                                       Social Determinants of Health (SDOH) Interventions    Readmission Risk Interventions No flowsheet data found.

## 2021-02-05 NOTE — Progress Notes (Signed)
PROGRESS NOTE    Jack [name]  CZY:[id number] DOB: November 13, 1932 DOA: 01/29/2021 PCP: [name], MD    Brief Narrative:  Jack [name] is an 87 year old male with past medical history significant for essential hypertension, CKD stage IIIb, anemia who presented to [facility] ED on 5/19 with recurrent/worsening low back pain over the past 3 days.  Patient denies any radiation of pain, no associated incontinence of urine/bowel.  Patient recently admitted at [facility] for T12 compression fracture subsequently discharged to rehab.  While in rehab for 2 weeks, he was discharged to home and doing well with the TLSO brace until now.   In the ED, MR with T12 and new L1 compression fracture.  Neurosurgery was consulted.  TRH consulted for further evaluation and management of acute pain related to subacute and acute thoracic/lumbar compression fracture.     Assessment & Plan:   Principal Problem:   T12 compression fracture (HCC) Active Problems:   Protein-calorie malnutrition, severe   T12/L1 compression fracture Patient presenting to ED from home with recurrent low back pain.  Recent T12 compression fracture treated conservatively with TLSO brace and recent discharge home from short-term rehab.  Now with recurrent low back pain without radiation or neurologic symptoms.  No trauma/falls.  CT T-spine with mild interval progression of previously demonstrated T12 compression fracture now with 60% height loss, associated disc bulge T11--12, no other thoracic spine fracture identified. MR T/L-spine without contrast with compression fracture T12/L1 with approximately 50% and 25% height loss respectively with mild retropulsion with no associated stenosis.  Patient was evaluated by neurosurgery with recommendations of kyphoplasty.  Interventional radiology was consulted and patient underwent T12/L1 kyphoplasty on 5/23 2022. --continue TSLO brace --continue PT/OT  efforts --Tramadol 50 mg PO q6h prn moderate pain --Morphine 2 mg IV q4h prn severe pain --Robaxin 500mg  PO q6h prn muscle spasms  Suspected left fifth rib fracture While transitioning from the stretcher to the IR table, patient reported pain to left flank/chest region.  Rib series concerning for suspicion of left anterior lateral fifth rib fracture, no adjacent pneumothorax evident. --Pain control --Incentive spirometry --Bilateral rib series 7-10 days if patient's symptoms persist  Essential hypertension Patient previously on ARB and HCTZ which was recently discontinued due to worsening renal function. --Carvedilol 6.25 mg p.o. twice daily --Hydralazine 50mg  PO TID --continue to monitor BP and adjust accordingly  Atrial fibrillation with RVR Initially required Cardizem drip, then converted to normal sinus rhythm.  Cardiology was consulted and followed during hospital course. --Amiodarone 400 mg p.o. twice daily x7 days, followed by 200 mg p.o. daily x21 days, then will discontinue --Eliquis 2.5mg  PO BID  Acute renal failure on CKD stage IIIb Baseline creatinine 1.6-2.0.  Recently discontinued ARB and HCTZ due to worsening renal function.  Patient with poor oral intake in the setting of procedures and pain. --Cr 1.66>1.58>>2.21>2.46>2.35>2.30 --continue IVF hydration with NS at 49mL/hr --BMP daily  Hyperlipidemia: atorvastatin 40mg  PO daily  BPH: Tamsulosin 0.4 mg p.o. daily  Severe protein calorie malnutrition Body mass index is 26.99 kg/m.  Nutrition Status: Nutrition Problem: Severe Malnutrition Etiology: acute illness (T12 and L1 compression fractures) Signs/Symptoms: moderate fat depletion,moderate muscle depletion,percent weight loss (12.7% weight loss in less than 2 months) Percent weight loss: 12.7 % Interventions: Ensure Enlive (each supplement provides 350kcal and 20 grams of protein),Magic cup,Liberalize Diet --Dietitian following, appreciate assistance.  Continue  to encourage increased oral intake, supplement use.  Constipation Received enema yesterday with improvement of symptoms. --  Senokot-S 1 tablet p.o. twice daily   DVT prophylaxis: Eliquis   Code Status: Full Code Family Communication: No family present at bedside  Disposition Plan:  Level of care: Telemetry Cardiac Status is: Inpatient  Remains inpatient appropriate because:Unsafe d/c plan, IV treatments appropriate due to intensity of illness or inability to take PO and Inpatient level of care appropriate due to severity of illness   Dispo: The patient is from: Home              Anticipated d/c is to: SNF              Patient currently is not medically stable to d/c.   Difficult to place patient No   Consultants:   Neurosurgery  Interventional radiology  Cardiology  Procedures:   Kyphoplasty T12/L1 5/23  Antimicrobials:   None   Subjective: Patient seen and examined at bedside, resting comfortably in bed.  Reports pain improved today.  No other specific complaints this morning. Denies headache, no dizziness, no current chest pain, no palpitations, no abdominal pain, no nausea/vomiting/diarrhea, no weakness/paresthesias, no cough/congestion.  No acute concerns overnight per nursing staff.  Pending SNF placement.  Objective: Vitals:   02/05/21 0849 02/05/21 1000 02/05/21 1315 02/05/21 1329  BP: 140/81  99/70 110/69  Pulse: 74 63 (!) 57 (!) 55  Resp:  15 20 16   Temp:      TempSrc:      SpO2: 97% 100% 94% (!) 89%  Weight:      Height:        Intake/Output Summary (Last 24 hours) at 02/05/2021 1432 Last data filed at 02/05/2021 1227 Gross per 24 hour  Intake 889.4 ml  Output 1375 ml  Net -485.6 ml   Filed Weights   01/30/21 0529  Weight: 82.9 kg    Examination:  General exam: Appears calm and comfortable  Respiratory system: Clear to auscultation. Respiratory effort normal.  On room air Cardiovascular system: S1 & S2 heard, RRR. No JVD, murmurs, rubs,  gallops or clicks. No pedal edema. Gastrointestinal system: Abdomen is nondistended, soft and nontender. No organomegaly or masses felt. Normal bowel sounds heard. Central nervous system: Alert and oriented. No focal neurological deficits. Extremities: Symmetric 5 x 5 power.  TSLO brace noted Skin: No rashes, lesions or ulcers Psychiatry: Judgement and insight appear normal. Mood & affect appropriate.     Data Reviewed: I have personally reviewed following labs and imaging studies  CBC: Recent Labs  Lab 02/01/21 0228 02/02/21 0355 02/03/21 0207 02/04/21 0141 02/05/21 0314  WBC 5.3 4.7 5.2 4.1 3.6*  HGB 11.4* 11.1* 11.6* 10.0* 10.1*  HCT 35.1* 34.9* 35.9* 30.8* 31.1*  MCV 93.4 94.3 94.2 94.2 93.7  PLT 232 228 238 229 604   Basic Metabolic Panel: Recent Labs  Lab 01/31/21 0138 02/01/21 0228 02/02/21 0355 02/03/21 0207 02/04/21 0141 02/05/21 0314  NA 139 139 138 141 139 140  K 3.8 4.0 4.4 4.3 4.0 4.2  CL 102 102 102 104 104 104  CO2 26 28 28 28 28 29   GLUCOSE 127* 115* 110* 122* 112* 109*  BUN 22 27* 29* 26* 32* 32*  CREATININE 1.68* 1.79* 2.21* 2.46* 2.35* 2.30*  CALCIUM 10.3 10.3 10.5* 10.5* 10.4* 11.4*  MG 2.4  --   --   --   --   --    GFR: Estimated Creatinine Clearance: 23.5 mL/min (A) (by C-G formula based on SCr of 2.3 mg/dL (H)). Liver Function Tests: No results for input(s): AST,  ALT, ALKPHOS, BILITOT, PROT, ALBUMIN in the last 168 hours. No results for input(s): LIPASE, AMYLASE in the last 168 hours. No results for input(s): AMMONIA in the last 168 hours. Coagulation Profile: Recent Labs  Lab 01/31/21 0138  INR 1.2   Cardiac Enzymes: No results for input(s): CKTOTAL, CKMB, CKMBINDEX, TROPONINI in the last 168 hours. BNP (last 3 results) No results for input(s): PROBNP in the last 8760 hours. HbA1C: No results for input(s): HGBA1C in the last 72 hours. CBG: No results for input(s): GLUCAP in the last 168 hours. Lipid Profile: No results for  input(s): CHOL, HDL, LDLCALC, TRIG, CHOLHDL, LDLDIRECT in the last 72 hours. Thyroid Function Tests: No results for input(s): TSH, T4TOTAL, FREET4, T3FREE, THYROIDAB in the last 72 hours. Anemia Panel: No results for input(s): VITAMINB12, FOLATE, FERRITIN, TIBC, IRON, RETICCTPCT in the last 72 hours. Sepsis Labs: No results for input(s): PROCALCITON, LATICACIDVEN in the last 168 hours.  Recent Results (from the past 240 hour(s))  Surgical pcr screen     Status: None   Collection Time: 01/30/21  5:20 AM   Specimen: Nasal Mucosa; Nasal Swab  Result Value Ref Range Status   MRSA, PCR NEGATIVE NEGATIVE Final   Staphylococcus aureus NEGATIVE NEGATIVE Final    Comment: (NOTE) The Xpert SA Assay (FDA approved for NASAL specimens in patients 59 years of age and older), is one component of a comprehensive surveillance program. It is not intended to diagnose infection nor to guide or monitor treatment. Performed at [facility], [place] [address], [place], [place] [address]   SARS CORONAVIRUS 2 (TAT 6-24 HRS) Nasopharyngeal Nasopharyngeal Swab     Status: None   Collection Time: 02/04/21  2:52 PM   Specimen: Nasopharyngeal Swab  Result Value Ref Range Status   SARS Coronavirus 2 NEGATIVE NEGATIVE Final    Comment: (NOTE) SARS-CoV-2 target nucleic acids are NOT DETECTED.  The SARS-CoV-2 RNA is generally detectable in upper and lower respiratory specimens during the acute phase of infection. Negative results do not preclude SARS-CoV-2 infection, do not rule out co-infections with other pathogens, and should not be used as the sole basis for treatment or other patient management decisions. Negative results must be combined with clinical observations, patient history, and epidemiological information. The expected result is Negative.  Fact Sheet for Patients: SugarRoll.be  Fact Sheet for Healthcare  Providers: https://www.woods-mathews.com/  This test is not yet approved or cleared by the [place] FDA and  has been authorized for detection and/or diagnosis of SARS-CoV-2 by FDA under an Emergency Use Authorization (EUA). This EUA will remain  in effect (meaning this test can be used) for the duration of the COVID-19 declaration under Se ction 564(b)(1) of the Act, 21 U.S.C. section 360bbb-3(b)(1), unless the authorization is terminated or revoked sooner.  Performed at [facility], [place] [address] [place], [place] [address]          Radiology Studies: No results found.      Scheduled Meds: . amiodarone  400 mg Oral BID   Followed by  . [START ON 02/07/2021] amiodarone  200 mg Oral Daily  . apixaban  2.5 mg Oral BID  . atorvastatin  40 mg Oral q1800  . bisacodyl  10 mg Rectal Once  . calcium-vitamin D  2 tablet Oral BID WC  . carvedilol  6.25 mg Oral BID WC  . feeding supplement  1 Container Oral Q24H  . feeding supplement  237 mL Oral BID BM  .  hydrALAZINE  50 mg Oral Q8H  . polyethylene glycol  17 g Oral BID  . senna-docusate  2 tablet Oral BID  . tamsulosin  0.4 mg Oral Daily   Continuous Infusions: . sodium chloride 75 mL/hr at 02/05/21 0824     LOS: 6 days    Time spent: 38 minutes spent on chart review, discussion with nursing staff, consultants, updating family and interview/physical exam; more than 50% of that time was spent in counseling and/or coordination of care.    Claramae Rigdon J [place] DO [facility] Available via Epic secure chat 7am-7pm After these hours, please refer to coverage provider listed on [email or web address] 02/05/2021, 2:32 PM

## 2021-02-05 NOTE — Progress Notes (Signed)
Blood pressure 110/60. Pt still very tired. Advised by attending to hold current hydralazine dose.

## 2021-02-05 NOTE — Progress Notes (Addendum)
Nutrition Follow-up  DOCUMENTATION CODES:   Severe malnutrition in context of acute illness/injury  INTERVENTION:    Continue Ensure Enlive po BID, each supplement provides 350 kcal and 20 grams of protein  Add Boost Breeze po once daily, each supplement provides 250 kcal and 9 grams of protein  Continue Magic cup BID with meals, each supplement provides 290 kcal and 9 grams of protein  NUTRITION DIAGNOSIS:   Severe Malnutrition related to acute illness (T12 and L1 compression fractures) as evidenced by moderate fat depletion,moderate muscle depletion,percent weight loss (12.7% weight loss in less than 2 months).  Ongoing   GOAL:   Patient will meet greater than or equal to 90% of their needs  Progressing   MONITOR:   PO intake,Supplement acceptance,Labs,Weight trends  REASON FOR ASSESSMENT:   Malnutrition Screening Tool    ASSESSMENT:   87 year old male who presented to the ED on 5/19 with back pain and nausea x 3 days. PMH of HTN, anemia, CKD stage III, HLD. Pt admitted with known compression fracture of T12 and new L1 compression fracture.  Patient states he is feeling a little better. He has been eating okay, but continues to experience poor appetite. He is trying to maximize intake by drinking Ensure supplements. Receiving magic cups BID with meals. He usually drinks 2 supplements per day. He prefers the juice and fruits on his meal trays. He doesn't think that he could drink another Ensure, but agreed to try Boost Breeze once daily at bedtime to increase protein and calorie intake.    Labs and medications reviewed.  Diet Order:   Diet Order            Diet Heart Room service appropriate? Yes; Fluid consistency: Thin  Diet effective now                 EDUCATION NEEDS:   Education needs have been addressed  Skin:  Skin Assessment: Reviewed RN Assessment  Last BM:  5/24  Height:   Ht Readings from Last 1 Encounters:  01/30/21 5\' 9"  (1.753 m)     Weight:   Wt Readings from Last 1 Encounters:  01/30/21 82.9 kg    BMI:  Body mass index is 26.99 kg/m.  Estimated Nutritional Needs:   Kcal:  2000-2200  Protein:  95-110 gm  Fluid:  1.8-2.0 L    Lucas Mallow, RD, LDN, CNSC Please refer to Amion for contact information.

## 2021-02-05 NOTE — Progress Notes (Signed)
Rehab Admissions Coordinator Note:  Patient was screened by Cleatrice Burke for appropriateness for an Inpatient Acute Rehab Consult per OT recs. Noted PT recs SNF. I will not place rehab consult order at this time for it is unlikely that [facility] will approve CIR for this diagnosis.  Cleatrice Burke RN MSN 02/05/2021, 10:47 AM  I can be reached at [phone].

## 2021-02-05 NOTE — Progress Notes (Signed)
Spoke to attending concerning heart rate in the 50's. Pt only expressed about being tired. Pt declined shortness of breath and chest pain. Advised to continue to monitor pt by Dr. [place]

## 2021-02-06 DIAGNOSIS — E43 Unspecified severe protein-calorie malnutrition: Secondary | ICD-10-CM | POA: Diagnosis not present

## 2021-02-06 DIAGNOSIS — S22080D Wedge compression fracture of T11-T12 vertebra, subsequent encounter for fracture with routine healing: Secondary | ICD-10-CM | POA: Diagnosis not present

## 2021-02-06 LAB — BASIC METABOLIC PANEL
Anion gap: 7 (ref 5–15)
BUN: 36 mg/dL — ABNORMAL HIGH (ref 8–23)
CO2: 26 mmol/L (ref 22–32)
Calcium: 9.7 mg/dL (ref 8.9–10.3)
Chloride: 106 mmol/L (ref 98–111)
Creatinine, Ser: 2.31 mg/dL — ABNORMAL HIGH (ref 0.61–1.24)
GFR, Estimated: 27 mL/min — ABNORMAL LOW (ref >60)
Glucose, Bld: 107 mg/dL — ABNORMAL HIGH (ref 70–99)
Potassium: 3.8 mmol/L (ref 3.5–5.1)
Sodium: 139 mmol/L (ref 135–145)

## 2021-02-06 LAB — SARS CORONAVIRUS 2 (TAT 6-24 HRS): SARS Coronavirus 2: NEGATIVE

## 2021-02-06 MED ORDER — MAGNESIUM CITRATE PO SOLN
1.0000 | Freq: Once | ORAL | Status: AC
  Administered 2021-02-06: 1 via ORAL
  Filled 2021-02-06: qty 296

## 2021-02-06 MED ORDER — HYDRALAZINE HCL 50 MG PO TABS
100.0000 mg | ORAL_TABLET | Freq: Three times a day (TID) | ORAL | Status: DC
  Administered 2021-02-06 – 2021-02-07 (×3): 100 mg via ORAL
  Filled 2021-02-06 (×3): qty 2

## 2021-02-06 NOTE — Progress Notes (Signed)
PROGRESS NOTE    Jack [name]  ZWC:[id number] DOB: 04-03-1933 DOA: 01/29/2021 PCP: [name], MD    Brief Narrative:  Jack [name] is an 87 year old male with past medical history significant for essential hypertension, CKD stage IIIb, anemia who presented to [facility] ED on 5/19 with recurrent/worsening low back pain over the past 3 days.  Patient denies any radiation of pain, no associated incontinence of urine/bowel.  Patient recently admitted at [facility] for T12 compression fracture subsequently discharged to rehab.  While in rehab for 2 weeks, he was discharged to home and doing well with the TLSO brace until now.   In the ED, MR with T12 and new L1 compression fracture.  Neurosurgery was consulted.  TRH consulted for further evaluation and management of acute pain related to subacute and acute thoracic/lumbar compression fracture.     Assessment & Plan:   Principal Problem:   T12 compression fracture (HCC) Active Problems:   Protein-calorie malnutrition, severe   T12/L1 compression fracture Patient presenting to ED from home with recurrent low back pain.  Recent T12 compression fracture treated conservatively with TLSO brace and recent discharge home from short-term rehab.  Now with recurrent low back pain without radiation or neurologic symptoms.  No trauma/falls.  CT T-spine with mild interval progression of previously demonstrated T12 compression fracture now with 60% height loss, associated disc bulge T11--12, no other thoracic spine fracture identified. MR T/L-spine without contrast with compression fracture T12/L1 with approximately 50% and 25% height loss respectively with mild retropulsion with no associated stenosis.  Patient was evaluated by neurosurgery with recommendations of kyphoplasty.  Interventional radiology was consulted and patient underwent T12/L1 kyphoplasty on 5/23 2022.  --continue TSLO brace --continue PT/OT  efforts --Tramadol 50 mg PO q6h prn moderate pain --Morphine 2 mg IV q4h prn severe pain --Robaxin 500mg  PO q6h prn muscle spasms --Plan DC on 5/28 to [facility] for further rehabilitation  Suspected left fifth rib fracture While transitioning from the stretcher to the IR table, patient reported pain to left flank/chest region.  Rib series concerning for suspicion of left anterior lateral fifth rib fracture, no adjacent pneumothorax evident. --Pain control --Incentive spirometry --Bilateral rib series 7-10 days if patient's symptoms persist  Essential hypertension Patient previously on ARB and HCTZ which was recently discontinued due to worsening renal function. --Carvedilol 6.25 mg p.o. twice daily --Hydralazine 50mg  PO TID --continue to monitor BP and adjust accordingly  Atrial fibrillation with RVR Initially required Cardizem drip, then converted to normal sinus rhythm.  Cardiology was consulted and followed during hospital course. --Amiodarone 400 mg p.o. twice daily x7 days, followed by 200 mg p.o. daily x21 days, then will discontinue --Eliquis 2.5mg  PO BID  Acute renal failure on CKD stage IIIb Baseline creatinine 1.6-2.0.  Recently discontinued ARB and HCTZ due to worsening renal function.  Patient with poor oral intake in the setting of procedures and pain. --Cr 1.66>1.58>>2.21>2.46>2.35>2.31  Hyperlipidemia: atorvastatin 40mg  PO daily  BPH: Tamsulosin 0.4 mg p.o. daily  Severe protein calorie malnutrition Body mass index is 26.99 kg/m.  Nutrition Status: Nutrition Problem: Severe Malnutrition Etiology: acute illness (T12 and L1 compression fractures) Signs/Symptoms: moderate fat depletion,moderate muscle depletion,percent weight loss (12.7% weight loss in less than 2 months) Percent weight loss: 12.7 % Interventions: Ensure Enlive (each supplement provides 350kcal and 20 grams of protein),Magic cup,Liberalize Diet --Dietitian following, appreciate  assistance.  Continue to encourage increased oral intake, supplement use.  Constipation --Senokot-S 1 tablet p.o.  twice daily  Hypercalcemia Patient was noted to have mild hypercalcemia in the setting of renal failure and recurrent fractures.  Other than compression and rib fracture, no bony lesions appreciated on imaging studies.  Os-Cal now discontinued.  Calcium level now normal today. --SPEP, UPEP, light chains: Pending   DVT prophylaxis: Eliquis   Code Status: Full Code Family Communication: No family present at bedside  Disposition Plan:  Level of care: Telemetry Cardiac Status is: Inpatient  Remains inpatient appropriate because:Unsafe d/c plan, IV treatments appropriate due to intensity of illness or inability to take PO and Inpatient level of care appropriate due to severity of illness   Dispo: The patient is from: Home              Anticipated d/c is to: SNF              Patient currently is not medically stable to d/c.   Difficult to place patient No   Consultants:   Neurosurgery  Interventional radiology  Cardiology  Procedures:   Kyphoplasty T12/L1 5/23  Antimicrobials:   None   Subjective: Patient seen and examined at bedside, resting comfortably in bedside chair.  Reports pain improved today.  No other specific complaints this morning. Denies headache, no dizziness, no current chest pain, no palpitations, no abdominal pain, no nausea/vomiting/diarrhea, no weakness/paresthesias, no cough/congestion.  No acute concerns overnight per nursing staff.  Peer-to-peer completed yesterday with [facility] [employer], now approved for SNF.  Plan discharge tomorrow.  Objective: Vitals:   02/06/21 0614 02/06/21 0707 02/06/21 0907 02/06/21 1154  BP: (!) 185/95 (!) 169/91 (!) 150/87 (!) 183/106  Pulse:   84 67  Resp:    18  Temp:      TempSrc:      SpO2:      Weight:      Height:        Intake/Output Summary (Last 24 hours) at 02/06/2021 1335 Last data filed  at 02/06/2021 0930 Gross per 24 hour  Intake 2382.51 ml  Output 1225 ml  Net 1157.51 ml   Filed Weights   01/30/21 0529  Weight: 82.9 kg    Examination:  General exam: Appears calm and comfortable  Respiratory system: Clear to auscultation. Respiratory effort normal.  On room air Cardiovascular system: S1 & S2 heard, RRR. No JVD, murmurs, rubs, gallops or clicks. No pedal edema. Gastrointestinal system: Abdomen is nondistended, soft and nontender. No organomegaly or masses felt. Normal bowel sounds heard. Central nervous system: Alert and oriented. No focal neurological deficits. Extremities: Symmetric 5 x 5 power.  TSLO brace noted in place Skin: No rashes, lesions or ulcers Psychiatry: Judgement and insight appear normal. Mood & affect appropriate.     Data Reviewed: I have personally reviewed following labs and imaging studies  CBC: Recent Labs  Lab 02/01/21 0228 02/02/21 0355 02/03/21 0207 02/04/21 0141 02/05/21 0314  WBC 5.3 4.7 5.2 4.1 3.6*  HGB 11.4* 11.1* 11.6* 10.0* 10.1*  HCT 35.1* 34.9* 35.9* 30.8* 31.1*  MCV 93.4 94.3 94.2 94.2 93.7  PLT 232 228 238 229 867   Basic Metabolic Panel: Recent Labs  Lab 01/31/21 0138 02/01/21 0228 02/02/21 0355 02/03/21 0207 02/04/21 0141 02/05/21 0314 02/06/21 0244  NA 139   < > 138 141 139 140 139  K 3.8   < > 4.4 4.3 4.0 4.2 3.8  CL 102   < > 102 104 104 104 106  CO2 26   < > 28 28 28 29  26  GLUCOSE 127*   < > 110* 122* 112* 109* 107*  BUN 22   < > 29* 26* 32* 32* 36*  CREATININE 1.68*   < > 2.21* 2.46* 2.35* 2.30* 2.31*  CALCIUM 10.3   < > 10.5* 10.5* 10.4* 11.4* 9.7  MG 2.4  --   --   --   --   --   --    < > = values in this interval not displayed.   GFR: Estimated Creatinine Clearance: 23.4 mL/min (A) (by C-G formula based on SCr of 2.31 mg/dL (H)). Liver Function Tests: No results for input(s): AST, ALT, ALKPHOS, BILITOT, PROT, ALBUMIN in the last 168 hours. No results for input(s): LIPASE, AMYLASE in the  last 168 hours. No results for input(s): AMMONIA in the last 168 hours. Coagulation Profile: Recent Labs  Lab 01/31/21 0138  INR 1.2   Cardiac Enzymes: No results for input(s): CKTOTAL, CKMB, CKMBINDEX, TROPONINI in the last 168 hours. BNP (last 3 results) No results for input(s): PROBNP in the last 8760 hours. HbA1C: No results for input(s): HGBA1C in the last 72 hours. CBG: No results for input(s): GLUCAP in the last 168 hours. Lipid Profile: No results for input(s): CHOL, HDL, LDLCALC, TRIG, CHOLHDL, LDLDIRECT in the last 72 hours. Thyroid Function Tests: No results for input(s): TSH, T4TOTAL, FREET4, T3FREE, THYROIDAB in the last 72 hours. Anemia Panel: No results for input(s): VITAMINB12, FOLATE, FERRITIN, TIBC, IRON, RETICCTPCT in the last 72 hours. Sepsis Labs: No results for input(s): PROCALCITON, LATICACIDVEN in the last 168 hours.  Recent Results (from the past 240 hour(s))  Surgical pcr screen     Status: None   Collection Time: 01/30/21  5:20 AM   Specimen: Nasal Mucosa; Nasal Swab  Result Value Ref Range Status   MRSA, PCR NEGATIVE NEGATIVE Final   Staphylococcus aureus NEGATIVE NEGATIVE Final    Comment: (NOTE) The Xpert SA Assay (FDA approved for NASAL specimens in patients 40 years of age and older), is one component of a comprehensive surveillance program. It is not intended to diagnose infection nor to guide or monitor treatment. Performed at [facility], [place] [address], [place], [place] [address]   SARS CORONAVIRUS 2 (TAT 6-24 HRS) Nasopharyngeal Nasopharyngeal Swab     Status: None   Collection Time: 02/04/21  2:52 PM   Specimen: Nasopharyngeal Swab  Result Value Ref Range Status   SARS Coronavirus 2 NEGATIVE NEGATIVE Final    Comment: (NOTE) SARS-CoV-2 target nucleic acids are NOT DETECTED.  The SARS-CoV-2 RNA is generally detectable in upper and lower respiratory specimens during the acute phase of infection. Negative results do not  preclude SARS-CoV-2 infection, do not rule out co-infections with other pathogens, and should not be used as the sole basis for treatment or other patient management decisions. Negative results must be combined with clinical observations, patient history, and epidemiological information. The expected result is Negative.  Fact Sheet for Patients: SugarRoll.be  Fact Sheet for Healthcare Providers: https://www.woods-mathews.com/  This test is not yet approved or cleared by the [place] FDA and  has been authorized for detection and/or diagnosis of SARS-CoV-2 by FDA under an Emergency Use Authorization (EUA). This EUA will remain  in effect (meaning this test can be used) for the duration of the COVID-19 declaration under Se ction 564(b)(1) of the Act, 21 U.S.C. section 360bbb-3(b)(1), unless the authorization is terminated or revoked sooner.  Performed at [facility], [place] [address], [place], [place] [address]  Radiology Studies: No results found.      Scheduled Meds: . acetaminophen  1,000 mg Oral Q6H  . amiodarone  400 mg Oral BID   Followed by  . [START ON 02/07/2021] amiodarone  200 mg Oral Daily  . apixaban  2.5 mg Oral BID  . atorvastatin  40 mg Oral q1800  . bisacodyl  10 mg Rectal Once  . calcitonin (salmon)  1 spray Alternating Nares Daily  . carvedilol  6.25 mg Oral BID WC  . feeding supplement  1 Container Oral Q24H  . feeding supplement  237 mL Oral BID BM  . hydrALAZINE  100 mg Oral Q8H  . lidocaine  1 patch Transdermal Q24H  . senna-docusate  2 tablet Oral BID  . tamsulosin  0.4 mg Oral Daily   Continuous Infusions: . sodium chloride 75 mL/hr at 02/06/21 1107     LOS: 7 days    Time spent: 38 minutes spent on chart review, discussion with nursing staff, consultants, updating family and interview/physical exam; more than 50% of that time was spent in counseling and/or coordination of  care.    Khiya Friese J [place] DO [facility] Available via Epic secure chat 7am-7pm After these hours, please refer to coverage provider listed on [email or web address] 02/06/2021, 1:35 PM

## 2021-02-06 NOTE — Progress Notes (Signed)
[place] for heparin Indication: atrial fibrillation  Allergies  Allergen Reactions  . Amlodipine Other (See Comments)    LE edema  . Azithromycin Nausea And Vomiting  . Lisinopril Cough    Patient Measurements: Height: 5\' 9"  (175.3 cm) Weight: 82.9 kg (182 lb 12.2 oz) IBW/kg (Calculated) : 70.7 Heparin Dosing Weight: 82.9 kg   Vital Signs: Temp: 97.6 F (36.4 C) (05/27 0448) Temp Source: Oral (05/27 0448) BP: 169/91 (05/27 0707) Pulse Rate: 55 (05/27 0448)  Labs: Recent Labs    02/04/21 0141 02/05/21 0314 02/06/21 0244  HGB 10.0* 10.1*  --   HCT 30.8* 31.1*  --   PLT 229 226  --   CREATININE 2.35* 2.30* 2.31*    Estimated Creatinine Clearance: 23.4 mL/min (A) (by C-G formula based on SCr of 2.31 mg/dL (H)).  Assessment: 87 y.o. male with Afib for heparin. Pt now s/p kyphoplasty, transitioned to apixaban. Cr remains stable ~2.3 - unlikely pt will require dose adjustment so pharmacy will sign off.  Goal of Therapy:  Monitor platelets by anticoagulation protocol: Yes   Plan:   Continue apixaban 2.5mg  BID Pharmacy will sign off - reconsult if needed   Arrie Senate, PharmD, BCPS, [facility] Clinical Pharmacist [phone] Please check AMION for all [place] numbers 02/06/2021

## 2021-02-06 NOTE — Progress Notes (Signed)
Physical Therapy Treatment Patient Details Name: Jack [name] MRN: [id number] DOB: 1933-03-20 Today's Date: 02/06/2021    History of Present Illness Pt is an 87 y.o. male admitted 01/29/21 with worsening back pain; pt has known T12 compression fx that was being treated conservatively. MRI showed worsening T12 compression fx, new L1 compression fx. Pt developed afib with RVR on 5/20. S/p kyphoplasty fo T12 and L1 on 5/23. Pt developed rib pain after procedure; imaging with possible L 5th rib fx. PMH includes HTN, CKD.   PT Comments    Pt progressing well with mobility. Today's session focused on transfer and gait training with RW, pt requires intermittent minA for stability. Pt motivated to participate; remains limited by pain, generalized weakness, impaired balance strategies/postural reactions; at high risk for falls. Continue to recommend SNF-level therapies to maximize functional mobility and independence prior to return home.    Follow Up Recommendations  SNF;Supervision for mobility/OOB     Equipment Recommendations  None recommended by PT    Recommendations for Other Services       Precautions / Restrictions Precautions Precautions: Back;Fall Required Braces or Orthoses: Spinal Brace Spinal Brace: Thoracolumbosacral orthotic;Applied in sitting position Restrictions Weight Bearing Restrictions: No    Mobility  Bed Mobility Overal bed mobility: Needs Assistance Bed Mobility: Rolling;Sidelying to Sit Rolling: Supervision Sidelying to sit: Min guard;HOB elevated     Sit to sidelying: Min guard;HOB elevated General bed mobility comments: Min guard for safety/lines, heavy reliance on bed rail    Transfers Overall transfer level: Needs assistance Equipment used: Rolling walker (2 wheeled) Transfers: Sit to/from Stand Sit to Stand: Min guard;From elevated surface;Min assist         General transfer comment: Min guard for balance standing from elevated bed height;  poor eccentric control into lower recliner height requiring minA to lower, additional sit<>stand trial from recliner with min guard; heavy reliance on BUE support  Ambulation/Gait Ambulation/Gait assistance: Min guard;Min assist Gait Distance (Feet): 20 Feet (+60) Assistive device: Rolling walker (2 wheeled) Gait Pattern/deviations: Step-through pattern;Decreased stride length;Antalgic Gait velocity: Decreased   General Gait Details: Slow, mildly unsteady gait with RW and min guard for balance; 1x instability requiring minA to correct; improved stability with multiple trials of turning; limited by pain and fatigue   Stairs             Wheelchair Mobility    Modified Rankin (Stroke Patients Only)       Balance Overall balance assessment: Needs assistance Sitting-balance support: Feet supported;Single extremity supported Sitting balance-Leahy Scale: Fair Sitting balance - Comments: 1 arm providing support intermittently as needed.   Standing balance support: Bilateral upper extremity supported;During functional activity Standing balance-Leahy Scale: Poor Standing balance comment: Reliant on at least single UE support                            Cognition Arousal/Alertness: Awake/alert Behavior During Therapy: WFL for tasks assessed/performed Overall Cognitive Status: Within Functional Limits for tasks assessed                                        Exercises      General Comments General comments (skin integrity, edema, etc.): HR 70s. Wife present and supportive. Pt motivated to participate      Pertinent Vitals/Pain Pain Assessment: Faces Pain Score: 7  Faces Pain Scale: Hurts  even more Pain Location: Lower back, bilateral posterior hips/sacrum Pain Descriptors / Indicators: Grimacing;Guarding Pain Intervention(s): Limited activity within patient's tolerance;Monitored during session;Premedicated before session    Home Living                       Prior Function            PT Goals (current goals can now be found in the care plan section) Acute Rehab PT Goals Patient Stated Goal: to leave the hospital Progress towards PT goals: Progressing toward goals    Frequency    Min 3X/week      PT Plan Current plan remains appropriate    Co-evaluation              AM-PAC PT "6 Clicks" Mobility   Outcome Measure  Help needed turning from your back to your side while in a flat bed without using bedrails?: A Little Help needed moving from lying on your back to sitting on the side of a flat bed without using bedrails?: A Little Help needed moving to and from a bed to a chair (including a wheelchair)?: A Little Help needed standing up from a chair using your arms (e.g., wheelchair or bedside chair)?: A Little Help needed to walk in hospital room?: A Little Help needed climbing 3-5 steps with a railing? : A Lot 6 Click Score: 17    End of Session Equipment Utilized During Treatment: Gait belt;Back brace Activity Tolerance: Patient tolerated treatment well Patient left: in chair;with call bell/phone within reach;with family/visitor present Nurse Communication: Mobility status PT Visit Diagnosis: Other abnormalities of gait and mobility (R26.89);Muscle weakness (generalized) (M62.81);Pain     Time: 1540-1600 PT Time Calculation (min) (ACUTE ONLY): 20 min  Charges:  $Gait Training: 8-22 mins                     Mabeline Caras, PT, DPT Acute Rehabilitation Services  Pager [phone] Office [place] 02/06/2021, 5:33 PM

## 2021-02-06 NOTE — Social Work (Signed)
Call received from pt's wife [name] who reports she is no longer agreeable to [facility] after reading reviews, and would like to accept offer from [facility]. Per MD, plan for possible dc tomorrow. Notified Juliann Pulse in admissions who confirmed they are able to accept pt over weekend and requested an updated COVID test. MD reports P2P completed yesterday and SNF has been approved. Voicemail left for [place] with [employer] requesting return call with auth details for SNF and ambulance. SW will assist as indicated.   Wandra Feinstein, MSW, LCSW [phone] (coverage)

## 2021-02-06 NOTE — Progress Notes (Signed)
Occupational Therapy Treatment Patient Details Name: Jack [name] MRN: [id number] DOB: [birth year] Today's Date: 02/06/2021    History of present illness Pt is an 87 y.o. male admitted 01/29/21 with worsening back pain; pt has known T12 compression fx that was being treated conservatively. MRI showed worsening T12 compression fx, new L1 compression fx. Pt developed afib with RVR on 5/20. S/p kyphoplasty fo T12 and L1 on 5/23. Pt developed rib pain after procedure; imaging with possible L 5th rib fx. PMH includes HTN, CKD.   OT comments  Pt progressing well towards OT goals now that pain level is managed. Pt completing grooming at the sink and transfers with min guard. This session pt refused OT assistance for any transfers or mobility, reporting that he could do it. Pt more motivated to move and participate in therapy. Acute OT will continue to follow up to assist with progressing pt's goals.    Follow Up Recommendations  SNF    Equipment Recommendations  None recommended by OT    Recommendations for Other Services      Precautions / Restrictions Precautions Precautions: Back;Fall Required Braces or Orthoses: Spinal Brace Spinal Brace: Thoracolumbosacral orthotic;Applied in sitting position       Mobility Bed Mobility Overal bed mobility: Needs Assistance Bed Mobility: Sidelying to Sit;Sit to Sidelying   Sidelying to sit: Min guard;HOB elevated     Sit to sidelying: Min guard;HOB elevated General bed mobility comments: Min guardf for safety, pt refusing any assist.    Transfers Overall transfer level: Needs assistance Equipment used: Rolling walker (2 wheeled) Transfers: Sit to/from Stand Sit to Stand: Min guard;From elevated surface         General transfer comment: Stood up x2 with no physical assit, min guard for safety    Balance Overall balance assessment: Needs assistance Sitting-balance support: Feet supported;Single extremity supported Sitting  balance-Leahy Scale: Fair Sitting balance - Comments: 1 arm providing support intermittently as needed.   Standing balance support: Bilateral upper extremity supported Standing balance-Leahy Scale: Fair Standing balance comment: Heavy reliance on UE support, leaning against sink and having 1 arm support intermittently for grooming standing up                           ADL either performed or assessed with clinical judgement   ADL Overall ADL's : Needs assistance/impaired     Grooming: Wash/dry hands;Wash/dry face;Oral care;Min guard;Standing Grooming Details (indicate cue type and reason): completed standing at the sink                 Toilet Transfer: Min guard;Ambulation;BSC;RW Toilet Transfer Details (indicate cue type and reason): Pt refused any assistance this session, reporting that he can do it.         Functional mobility during ADLs: Rolling walker;Min guard General ADL Comments: Pt ambulating much better this session, requiring min guard for safety, no physical assistance.     Vision       Perception     Praxis      Cognition Arousal/Alertness: Awake/alert Behavior During Therapy: WFL for tasks assessed/performed Overall Cognitive Status: Within Functional Limits for tasks assessed                                          Exercises     Shoulder Instructions       General  Comments VSS on RA    Pertinent Vitals/ Pain       Pain Assessment: 0-10 Pain Score: 7  Pain Location: Lower back, bilateral posterior hips/sacrum Pain Descriptors / Indicators: Grimacing;Guarding Pain Intervention(s): Limited activity within patient's tolerance;Monitored during session;Patient requesting pain meds-RN notified;Repositioned  Home Living                                          Prior Functioning/Environment              Frequency  Min 2X/week        Progress Toward Goals  OT Goals(current goals can  now be found in the care plan section)  Progress towards OT goals: Progressing toward goals  Acute Rehab OT Goals Patient Stated Goal: to leave the hospital OT Goal Formulation: With patient/family Time For Goal Achievement: 02/18/21 Potential to Achieve Goals: Good ADL Goals Pt Will Perform Grooming: with modified independence;standing Pt Will Perform Upper Body Dressing: with modified independence;sitting Pt Will Transfer to Toilet: with modified independence;stand pivot transfer Additional ADL Goal #1: Pt will tolerate standing for 3+ mins to complete grooming activities at the sink.  Plan Discharge plan remains appropriate;Frequency remains appropriate    Co-evaluation                 AM-PAC OT "6 Clicks" Daily Activity     Outcome Measure   Help from another person eating meals?: None Help from another person taking care of personal grooming?: A Little Help from another person toileting, which includes using toliet, bedpan, or urinal?: A Little Help from another person bathing (including washing, rinsing, drying)?: A Lot Help from another person to put on and taking off regular upper body clothing?: A Little Help from another person to put on and taking off regular lower body clothing?: A Lot 6 Click Score: 17    End of Session Equipment Utilized During Treatment: Rolling walker;Back brace  OT Visit Diagnosis: Unsteadiness on feet (R26.81);Repeated falls (R29.6);Muscle weakness (generalized) (M62.81)   Activity Tolerance Patient limited by pain   Patient Left in bed;with call bell/phone within reach   Nurse Communication Mobility status;Patient requests pain meds        Time: 1432-1455 OT Time Calculation (min): 23 min  Charges: OT General Charges $OT Visit: 1 Visit OT Treatments $Self Care/Home Management : 23-37 mins  Ahna Konkle H., OTR/L Acute Rehabilitation  Liliyana Thobe Elane Yolanda Bonine 02/06/2021, 4:38 PM

## 2021-02-07 DIAGNOSIS — S22080D Wedge compression fracture of T11-T12 vertebra, subsequent encounter for fracture with routine healing: Secondary | ICD-10-CM | POA: Diagnosis not present

## 2021-02-07 DIAGNOSIS — I10 Essential (primary) hypertension: Secondary | ICD-10-CM | POA: Diagnosis not present

## 2021-02-07 DIAGNOSIS — S32010S Wedge compression fracture of first lumbar vertebra, sequela: Secondary | ICD-10-CM | POA: Diagnosis present

## 2021-02-07 DIAGNOSIS — M549 Dorsalgia, unspecified: Secondary | ICD-10-CM | POA: Diagnosis not present

## 2021-02-07 DIAGNOSIS — R5381 Other malaise: Secondary | ICD-10-CM | POA: Diagnosis not present

## 2021-02-07 DIAGNOSIS — D649 Anemia, unspecified: Secondary | ICD-10-CM | POA: Diagnosis not present

## 2021-02-07 DIAGNOSIS — N400 Enlarged prostate without lower urinary tract symptoms: Secondary | ICD-10-CM | POA: Diagnosis not present

## 2021-02-07 DIAGNOSIS — M6281 Muscle weakness (generalized): Secondary | ICD-10-CM | POA: Diagnosis not present

## 2021-02-07 DIAGNOSIS — I469 Cardiac arrest, cause unspecified: Secondary | ICD-10-CM | POA: Diagnosis not present

## 2021-02-07 DIAGNOSIS — R262 Difficulty in walking, not elsewhere classified: Secondary | ICD-10-CM | POA: Diagnosis not present

## 2021-02-07 DIAGNOSIS — S32010D Wedge compression fracture of first lumbar vertebra, subsequent encounter for fracture with routine healing: Secondary | ICD-10-CM | POA: Diagnosis not present

## 2021-02-07 DIAGNOSIS — S32010A Wedge compression fracture of first lumbar vertebra, initial encounter for closed fracture: Secondary | ICD-10-CM | POA: Diagnosis not present

## 2021-02-07 DIAGNOSIS — E43 Unspecified severe protein-calorie malnutrition: Secondary | ICD-10-CM | POA: Diagnosis not present

## 2021-02-07 DIAGNOSIS — N179 Acute kidney failure, unspecified: Secondary | ICD-10-CM | POA: Diagnosis not present

## 2021-02-07 DIAGNOSIS — Z9842 Cataract extraction status, left eye: Secondary | ICD-10-CM | POA: Diagnosis not present

## 2021-02-07 DIAGNOSIS — G629 Polyneuropathy, unspecified: Secondary | ICD-10-CM | POA: Diagnosis not present

## 2021-02-07 DIAGNOSIS — M5459 Other low back pain: Secondary | ICD-10-CM | POA: Diagnosis not present

## 2021-02-07 DIAGNOSIS — I499 Cardiac arrhythmia, unspecified: Secondary | ICD-10-CM | POA: Diagnosis not present

## 2021-02-07 DIAGNOSIS — N1832 Chronic kidney disease, stage 3b: Secondary | ICD-10-CM | POA: Diagnosis not present

## 2021-02-07 DIAGNOSIS — I700 Atherosclerosis of aorta: Secondary | ICD-10-CM | POA: Diagnosis not present

## 2021-02-07 DIAGNOSIS — K219 Gastro-esophageal reflux disease without esophagitis: Secondary | ICD-10-CM | POA: Diagnosis not present

## 2021-02-07 DIAGNOSIS — E559 Vitamin D deficiency, unspecified: Secondary | ICD-10-CM | POA: Diagnosis not present

## 2021-02-07 DIAGNOSIS — Z4789 Encounter for other orthopedic aftercare: Secondary | ICD-10-CM | POA: Diagnosis not present

## 2021-02-07 DIAGNOSIS — E785 Hyperlipidemia, unspecified: Secondary | ICD-10-CM | POA: Diagnosis not present

## 2021-02-07 LAB — PTH, INTACT AND CALCIUM
Calcium, Total (PTH): 10.4 mg/dL — ABNORMAL HIGH (ref 8.6–10.2)
PTH: 11 pg/mL — ABNORMAL LOW (ref 15–65)

## 2021-02-07 MED ORDER — SENNOSIDES-DOCUSATE SODIUM 8.6-50 MG PO TABS: 2.0000 | ORAL_TABLET | Freq: Two times a day (BID) | ORAL | Status: DC

## 2021-02-07 MED ORDER — APIXABAN 2.5 MG PO TABS: 2.5000 mg | ORAL_TABLET | Freq: Two times a day (BID) | ORAL | Status: DC

## 2021-02-07 MED ORDER — HYDRALAZINE HCL 100 MG PO TABS: 100.0000 mg | ORAL_TABLET | Freq: Three times a day (TID) | ORAL | Status: DC

## 2021-02-07 MED ORDER — AMIODARONE HCL 200 MG PO TABS: 200.0000 mg | ORAL_TABLET | Freq: Every day | ORAL | 0 refills | Status: DC

## 2021-02-07 MED ORDER — TRAMADOL HCL 50 MG PO TABS: 50.0000 mg | ORAL_TABLET | Freq: Four times a day (QID) | ORAL | 0 refills | Status: AC | PRN

## 2021-02-07 MED ORDER — CALCITONIN (SALMON) 200 UNIT/ACT NA SOLN: 1.0000 | Freq: Every day | NASAL | 12 refills | Status: DC

## 2021-02-07 MED ORDER — METHOCARBAMOL 500 MG PO TABS: 500.0000 mg | ORAL_TABLET | Freq: Four times a day (QID) | ORAL | Status: DC | PRN

## 2021-02-07 MED ORDER — CARVEDILOL 6.25 MG PO TABS: 6.2500 mg | ORAL_TABLET | Freq: Two times a day (BID) | ORAL | Status: DC

## 2021-02-07 MED ORDER — LIDOCAINE 5 % EX PTCH: 1.0000 | MEDICATED_PATCH | CUTANEOUS | 0 refills | Status: DC

## 2021-02-07 NOTE — Plan of Care (Signed)

## 2021-02-07 NOTE — TOC Transition Note (Signed)
Transition of Care [facility]) - CM/SW Discharge Note   Patient Details  Name: LOCHLIN EPPINGER MRN: [id number] Date of Birth: Apr 14, 1933  [facility] of [facility]) CM/SW Contact:  Bary Castilla, LCSW Phone Number: [phone] 02/07/2021, 10:34 AM   Clinical Narrative:     Patient will DC to:?[place] date:?02/07/2021 Family notified:?Barbara Transport by: Corey Harold   Per MD patient ready for DC to [facility]. RN, patient, patient's family, and facility notified of DC. Discharge Summary sent to facility. RN given number for report [phone] room 107. DC packet on chart. Ambulance transport requested for patient.   CSW signing off.   [place], [place] [phone]   Final next level of care: Skilled Nursing Facility Barriers to Discharge: Barriers Resolved   Patient Goals and CMS Choice Patient states their goals for this hospitalization and ongoing recovery are:: to go to SNF CMS Medicare.gov Compare Post Acute Care list provided to:: Patient Choice offered to / list presented to : Patient  Discharge Placement              Patient chooses bed at: [facility] Patient to be transferred to facility by: [place] Name of family member notified: [name] Patient and family notified of of transfer: 02/07/21  Discharge Plan and Services In-house Referral: Clinical Social Work                                   Social Determinants of Health ([place]) Interventions     Readmission Risk Interventions No flowsheet data found.

## 2021-02-07 NOTE — Consult Note (Signed)
Palliative Care Consultation Note  Mr. Hritz is a very pleasant 87 yo man readmitted with complications related to T12 compression fracture. He is s/p kyphoplasty. For the past 3 months he has had a series of health related issues including kidney stones, unexplained weight loss, back pain, L1 and T12 compression fractures.He experienced a possible rib fracture during this hospitalization. His appetite is poor and his pain has been challenging. At baseline, and prior to a couple of months ago he reports being in excellent health-active and living a fully functional, high quality of life in his independent living senior community where he lives with his wife.   Palliative consult requested for symptom management and ACP.  Symptoms Impacting QOL:  Back pain related to Compression Fractures Bilateral Rib Pain, Possible Acute Fracture Unexplained Weight Loss Persistent Nausea and Poor Appetite Deconditioning and Immobility  Status of Advanced Care Planning: Patient has Living Will HCPOA documents and will have his wife bring those in so we can scan them into the medical record.  Assessment: Mr. Knippenberg is a very cognitively sharp and engaged gentleman. He also appears to be a fairly strong and physically robust man. Things really rapidly declined for him him March when he went trough a complicated kidney stone issue followed by the compression fractures. There really isn't a clear explanation for why he is fracturing so easily- he didn't fall and thinks it happened when he helped a neighbor off the floor who fell. He says his weight loss started after the kidney stones and the nausea-perhaps related to opioid pain medications. He also had hypercalcemia on admission-he tells me he was told to take more calcium pills. He endorses than he was becoming depressed with his loss of physical health but he feels hopeful and optimistic today.  He reports significant improvement in his pain levels after his pain  regimen was scheduled yesterday. He is still having nausea-tramadol is one of the more nausea inducing opioids so he will notice if the nausea seems to occur after taking this. He had a BM today which helped.   1. Pain: Scheduled Tylenol and Tramadol, Lidoderm to Rib area, has back brace.  2. Mobility is improving with pain control.  3. Nausea- we discussed scheduling nausea meds with administration of opioids and tylenol.  4. Hypercalcemia: Stopped calcium supplements. Started Calcitonin-this is very helpful for compression fracture related pain. Myeloma work up pending, ordered PTH and inonized Ca yesterday. May need occult malignancy work up if issues persist.  Discharge Planning: He is very motivated and appears to have a very good chance at recovering his QOL if we can determine underlying cause of his fractures, if his appetite returns, treat his pain and keep him mobile during healing process. His goal is to return home with his wife and regain as much function as possible.   He has a rehab bed at [facility] on Wednesday according to him-he is concerned about going to [employer] for 4 days and then moving to [facility]. They are from [place] and [place] [facility] is too far for his wife to have to drive.  He is agreeable to outpatient palliative help with pain and symptom management.  Lane Hacker, DO Palliative Medicine   Time: 50 min Greater than 50%  of this time was spent counseling and coordinating care related to the above assessment and plan.

## 2021-02-07 NOTE — Progress Notes (Signed)
All discharge information provided to RN at [facility]. Discharge paperwork including medication, prescription meds and follow-up placed in patient's discharge package. IV removed and back brace placed on patient. No further question ask. Patient waiting on [facility] for transport to facility.

## 2021-02-07 NOTE — Discharge Summary (Signed)
Physician Discharge Summary  HENRICK MCGUE KCL:[id number] DOB: 03-Feb-1933 DOA: 01/29/2021  PCP: [name], MD  Admit date: 01/29/2021 Discharge date: 02/07/2021  Admitted From: Home  Disposition: [facility]  Recommendations for Outpatient Follow-up:  1. Follow up with PCP in 1-2 weeks 2. Follow-up with cardiology on 02/24/2021 at 9 AM 3. Continue amiodarone 200 mg p.o. daily to complete additional 20 days per cardiology for atrial fibrillation which is now converted 4. Started on carvedilol 6.25 mg p.o. twice daily for rate control and hypertension 5. Increased hydralazine 200 mg p.o. 3 times daily. 6. Started on calcitonin for hypercalcemia and compression fracture related pain 7. Started on apixaban 2.5 mg p.o. twice daily for anticoagulation 8. Please obtain BMP in one week to assess renal function and calcium level 9. Please follow up on the following pending results: SPEP/UPEP 10. Consider outpatient skeletal survey 11. Consider outpatient referral to nephrology given renal dysfunction 12. Ambulatory referral to palliative care to follow outpatient for assistance with pain control and goals of care   Discharge Condition: Stable CODE STATUS: Full code Diet recommendation: Heart healthy diet  History of present illness:  Jack [name] is an 87 year old male with past medical history significant for essential hypertension, CKD stage IIIb, anemia who presented to [facility] ED on 5/19 with recurrent/worsening low back pain over the past 3 days.  Patient denies any radiation of pain, no associated incontinence of urine/bowel.  Patient recently admitted at [facility] for T12 compression fracture subsequently discharged to rehab.  While in rehab for 2 weeks, he was discharged to home and doing well with the TLSO brace until now.   In the ED, MR with T12 and new L1 compression fracture.  Neurosurgery was consulted.  TRH consulted for  further evaluation and management of acute pain related to subacute and acute thoracic/lumbar compression fracture.    Hospital course:  T12/L1 compression fracture Patient presenting to ED from home with recurrent low back pain.  Recent T12 compression fracture treated conservatively with TLSO brace and recent discharge home from short-term rehab.  Now with recurrent low back pain without radiation or neurologic symptoms.  No trauma/falls.  CT T-spine with mild interval progression of previously demonstrated T12 compression fracture now with 60% height loss, associated disc bulge T11--12, no other thoracic spine fracture identified. MR T/L-spine without contrast with compression fracture T12/L1 with approximately 50% and 25% height loss respectively with mild retropulsion with no associated stenosis.  Patient was evaluated by neurosurgery with recommendations of kyphoplasty.  Interventional radiology was consulted and patient underwent T12/L1 kyphoplasty on 5/23 2022. Continue TSLO brace.  Tylenol, tramadol 50 mg PO q6h prn moderate pain, Robaxin 500mg  PO q6h prn muscle spasms.  Discharge to [facility] for further rehabilitation  Suspected left fifth rib fracture While transitioning from the stretcher to the IR table, patient reported pain to left flank/chest region.  Rib series concerning for suspicion of left anterior lateral fifth rib fracture, no adjacent pneumothorax evident. Bilateral rib series 7-10 days if patient's symptoms persist  Essential hypertension Patient previously on ARB and HCTZ which was recently discontinued due to worsening renal function. Carvedilol 6.25 mg p.o. twice daily, Hydralazine 100 mg PO TID.  Atrial fibrillation with RVR: Resolved Initially required Cardizem drip, then converted to normal sinus rhythm.  Cardiology was consulted and followed during hospital course.  Completed amiodarone 400 mg p.o. twice daily for 7 days and now on 200 mg p.o. daily x21  days,  then will discontinue per cardiology recommendations.  Started on Eliquis 2.5mg  PO BID for anticoagulation.  Outpatient follow-up with cardiology as scheduled.  Acute renal failure on CKD stage IIIb Baseline creatinine 1.6-2.0.  Recently discontinued ARB and HCTZ due to worsening renal function.  Patient with poor oral intake in the setting of procedures and pain.  Creatinine 2.31 at time of discharge.  Recommend repeat BMP 1 week.  Hyperlipidemia: atorvastatin 40mg  PO daily  BPH: Tamsulosin 0.4 mg p.o. daily  Severe protein calorie malnutrition Body mass index is 26.99 kg/m.  Nutrition Status: Nutrition Problem: Severe Malnutrition Etiology: acute illness (T12 and L1 compression fractures) Signs/Symptoms: moderate fat depletion,moderate muscle depletion,percent weight loss (12.7% weight loss in less than 2 months) Percent weight loss: 12.7 % Interventions: Ensure Enlive (each supplement provides 350kcal and 20 grams of protein),Magic cup,Liberalize Diet --Dietitian following, appreciate assistance.  Continue to encourage increased oral intake, supplement use.  Constipation Senokot-S 1 tablet p.o. twice daily  Hypercalcemia Patient was noted to have mild hypercalcemia in the setting of renal failure and recurrent fractures.  Other than compression and rib fracture, no bony lesions appreciated on imaging studies.  Os-Cal now discontinued.    Started on calcitonin.  Calcium level now normal. SPEP, UPEP pending at time of discharge.  May need further malignancy work-up dependent on results.  Outpatient follow-up with PCP.  Discharge Diagnoses:  Principal Problem:   T12 compression fracture ([place]) Active Problems:   Protein-calorie malnutrition, severe   Closed compression fracture of body of L1 vertebra [facility])    Discharge Instructions  Discharge Instructions    Amb Referral to Palliative Care   Complete by: As directed    Call MD for:  difficulty breathing, headache or  visual disturbances   Complete by: As directed    Call MD for:  extreme fatigue   Complete by: As directed    Call MD for:  persistant dizziness or light-headedness   Complete by: As directed    Call MD for:  persistant nausea and vomiting   Complete by: As directed    Call MD for:  severe uncontrolled pain   Complete by: As directed    Call MD for:  temperature >100.4   Complete by: As directed    Diet - low sodium heart healthy   Complete by: As directed    Increase activity slowly   Complete by: As directed    No wound care   Complete by: As directed      Allergies as of 02/07/2021      Reactions   Amlodipine Other (See Comments)   LE edema   Azithromycin Nausea And Vomiting   Lisinopril Cough      Medication List    STOP taking these medications   magnesium oxide 400 MG tablet Commonly known as: MAG-OX     TAKE these medications   acetaminophen 325 MG tablet Commonly known as: TYLENOL Take 2 tablets (650 mg total) by mouth every 6 (six) hours as needed for fever, headache or moderate pain.   amiodarone 200 MG tablet Commonly known as: PACERONE Take 1 tablet (200 mg total) by mouth daily for 20 days. Start taking on: Feb 08, 2021   apixaban 2.5 MG Tabs tablet Commonly known as: ELIQUIS Take 1 tablet (2.5 mg total) by mouth 2 (two) times daily.   atorvastatin 40 MG tablet Commonly known as: Lipitor Take 1 tablet (40 mg total) by mouth daily at 6 PM.   calcitonin (salmon) 200 UNIT/ACT  nasal spray Commonly known as: MIACALCIN/FORTICAL Place 1 spray into alternate nostrils daily.   carvedilol 6.25 MG tablet Commonly known as: COREG Take 1 tablet (6.25 mg total) by mouth 2 (two) times daily with a meal.   cholecalciferol 1000 units tablet Commonly known as: VITAMIN D Take 1,000 Units by mouth daily.   docusate sodium 100 MG capsule Commonly known as: COLACE Take 1 capsule (100 mg total) by mouth 2 (two) times daily.   gabapentin 100 MG  capsule Commonly known as: NEURONTIN Take 100 mg by mouth 3 (three) times daily.   hydrALAZINE 100 MG tablet Commonly known as: APRESOLINE Take 1 tablet (100 mg total) by mouth every 8 (eight) hours. What changed:   medication strength  how much to take   lidocaine 5 % Commonly known as: LIDODERM Place 1 patch onto the skin daily. Remove & Discard patch within 12 hours or as directed by MD   methocarbamol 500 MG tablet Commonly known as: ROBAXIN Take 1 tablet (500 mg total) by mouth every 6 (six) hours as needed for muscle spasms.   omeprazole 20 MG capsule Commonly known as: PRILOSEC Take 20 mg by mouth daily.   PRESERVISION AREDS 2 PO Take 1 capsule by mouth 2 (two) times daily.   senna-docusate 8.6-50 MG tablet Commonly known as: Senokot-S Take 2 tablets by mouth 2 (two) times daily.   tamsulosin 0.4 MG Caps capsule Commonly known as: FLOMAX Take 1 capsule (0.4 mg total) by mouth daily.   traMADol 50 MG tablet Commonly known as: ULTRAM Take 1 tablet (50 mg total) by mouth every 6 (six) hours as needed for up to 7 days for moderate pain.   VITAMIN B-12 PO Take 1 tablet by mouth daily.   vitamin C 1000 MG tablet Take 1,000 mg by mouth daily.   Zinc 50 MG Caps Take 50 mg by mouth daily.       Contact information for follow-up providers    Marrianne Mood D, PA-C Follow up on 02/24/2021.   Specialties: Physician Assistant, Cardiology Why: Please arrive 15 minutes early for your 9am post-hospital cardiology appointment Contact information: [place] [address] [phone]        [name], MD. Schedule an appointment as soon as possible for a visit in 1 week(s).   Specialty: Family Medicine Contact information: [address] [place] [address] [phone]        Nelva Bush, MD .   Specialty: Cardiology Contact information: [place] [address] [phone]             Contact information for after-discharge care    Destination    [facility] Preferred SNF .   Service: Skilled Nursing Contact information: [address] [address] [phone]                 Allergies  Allergen Reactions  . Amlodipine Other (See Comments)    LE edema  . Azithromycin Nausea And Vomiting  . Lisinopril Cough    Consultations:  Cardiology  Interventional radiology  Palliative care   Procedures/Studies: CT ABDOMEN PELVIS WO CONTRAST  Result Date: 01/29/2021 CLINICAL DATA:  Acute generalized abdominal pain and distention. EXAM: CT ABDOMEN AND PELVIS WITHOUT CONTRAST TECHNIQUE: Multidetector CT imaging of the abdomen and pelvis was performed following the standard protocol without IV contrast. COMPARISON:  January 02, 2021. FINDINGS: Lower chest: Moderate size sliding-type hiatal hernia is noted.  Visualized lung bases are unremarkable. Hepatobiliary: No focal liver abnormality is seen. No gallstones, gallbladder wall thickening, or biliary dilatation. Pancreas: Unremarkable. No pancreatic ductal dilatation or surrounding inflammatory changes. Spleen: Normal in size without focal abnormality. Adrenals/Urinary Tract: Adrenal glands are unremarkable. Kidneys are normal, without renal calculi, focal lesion, or hydronephrosis. Bladder is unremarkable. Stomach/Bowel: Stomach is within normal limits. Appendix appears normal. No evidence of bowel wall thickening, distention, or inflammatory changes. Vascular/Lymphatic: Aortic atherosclerosis. No enlarged abdominal or pelvic lymph nodes. Reproductive: Stable moderate prostatic enlargement is noted. Other: No abdominal wall hernia or abnormality. No abdominopelvic ascites. Musculoskeletal: Stable subacute to old T12 compression fracture. IMPRESSION: Moderate size sliding-type hiatal hernia. Stable moderate prostatic enlargement. No acute abnormality seen in the abdomen or pelvis.  Aortic Atherosclerosis (ICD10-I70.0). Electronically Signed   By: Marijo Conception M.D.   On: 01/29/2021 16:05   DG Ribs Bilateral  Result Date: 02/03/2021 CLINICAL DATA:  Pain. EXAM: BILATERAL RIBS - 3+ VIEW COMPARISON:  Chest x-ray 02/02/2021. FINDINGS: Nondisplaced left anterolateral fifth rib fracture cannot be completely excluded. Prior thoracolumbar vertebroplasties. Calcifications noted over the left lung most likely calcified granulomas. Bibasilar atelectasis. IMPRESSION: Nondisplaced left anterolateral fifth rib fracture cannot be completely excluded. No evidence of pneumothorax. Electronically Signed   By: Marcello Moores  Register   On: 02/03/2021 05:18   DG Abd 1 View  Result Date: 02/01/2021 CLINICAL DATA:  Nausea and vomiting. EXAM: ABDOMEN - 1 VIEW COMPARISON:  December 25, 2020 FINDINGS: Hernia mesh projects over the pelvis. Degenerative changes in the lumbar spine. No other bony abnormalities. No free air, portal venous gas, or pneumatosis. No bowel obstruction. IMPRESSION: No acute abnormalities identified. Electronically Signed   By: Dorise Bullion III M.D   On: 02/01/2021 10:25   CT Thoracic Spine Wo Contrast  Result Date: 01/29/2021 CLINICAL DATA:  Mid back pain; mid back pain, history of T12 fracture, evaluate for additional trauma. Additional provided: Patient reports back pain from old injury, nausea, symptoms persist in for 3 days. EXAM: CT THORACIC SPINE WITHOUT CONTRAST TECHNIQUE: Multidetector CT images of the thoracic were obtained using the standard protocol without intravenous contrast. COMPARISON:  CT abdomen/pelvis 01/02/2021. FINDINGS: Alignment: No significant spondylolisthesis. Unchanged mild bony retropulsion at the level of the T12 superior endplate. Vertebrae: Mild interval progression of height loss at site of a previously demonstrated T12 compression fracture (now 60%). Vertebral body height is otherwise maintained within the thoracic spine. No evidence of fracture elsewhere  within the thoracic spine. T9 vertebral body hemangioma. Multilevel ventrolateral osteophytes. Paraspinal and other soft tissues: Linear atelectasis within the imaged lung bases. Hiatal hernia. Aortoiliac atherosclerosis. Disc levels: Multilevel disc space narrowing. Most notably, there is moderate to moderately advanced disc space narrowing at the T7-T8 through T9-T12 levels. At T11-T12, there is mild bony retropulsion at the level of the T12 superior endplate. Associated disc bulge. Mild relative narrowing of the ventral spinal canal. Mild bilateral neural foraminal narrowing. No appreciable significant disc herniation or spinal canal stenosis at the remaining thoracic levels. No compressive foraminal narrowing. IMPRESSION: Mild interval progression of a previously demonstrated T12 compression fracture (now with 60% height loss). Unchanged mild bony retropulsion at the level of the T12 superior endplate. Associated disc bulge at T11-T12. Resultant mild relative narrowing of the spinal canal at this level. Mild bilateral T11-T12 neural foraminal narrowing. No other thoracic spine fracture is identified. No significant disc herniation, spinal canal stenosis or compressive neural foraminal narrowing at the remaining thoracic levels. Linear atelectasis within the imaged  lung bases. Hiatal hernia. Aortic Atherosclerosis (ICD10-I70.0). Electronically Signed   By: Kellie Simmering DO   On: 01/29/2021 16:30   MR THORACIC SPINE WO CONTRAST  Result Date: 01/29/2021 CLINICAL DATA:  Mid back pain with compression fracture. EXAM: MRI THORACIC AND LUMBAR SPINE WITHOUT CONTRAST TECHNIQUE: Multiplanar and multiecho pulse sequences of the thoracic and lumbar spine were obtained without intravenous contrast. COMPARISON:  CT abdomen pelvis 10/16/2017 CT chest 11/21/2017 CT thoracic and lumbar spine 01/29/2021 FINDINGS: MRI THORACIC SPINE FINDINGS Alignment:  Normal Vertebrae: Compression fracture of T12 with approximately 50% height  loss and moderate edema. 5 mm of retropulsion. T9 hemangioma. High fluid sensitive signal within the T8 vertebral body near the inferior endplate, possibly a Schmorl's node. Cord:  Normal signal and morphology. Paraspinal and other soft tissues: Negative. Disc levels: No spinal canal stenosis. MRI LUMBAR SPINE FINDINGS Segmentation:  Standard Alignment:  Normal Vertebrae: High fluid sensitive signal focus in the L4 vertebral body. L1 compression fracture with less than 25% height loss. Minimal retropulsion. Conus medullaris and cauda equina: Conus extends to the L2 level. Conus and cauda equina appear normal. Paraspinal and other soft tissues: Negative. Disc levels: L1-L2: Normal disc space and facet joints. No spinal canal stenosis. No neural foraminal stenosis. L2-L3: Normal disc space and facet joints. No spinal canal stenosis. No neural foraminal stenosis. L3-L4: Intermediate disc bulge. No spinal canal stenosis. No neural foraminal stenosis. L4-L5: Mild facet hypertrophy with mild left asymmetric disc bulge. No spinal canal stenosis. No neural foraminal stenosis. L5-S1: Normal disc space and facet joints. No spinal canal stenosis. No neural foraminal stenosis. Visualized sacrum: Normal. IMPRESSION: 1. Compression fractures of T12 and L1 with approximately 50% and less than 25% height loss, respectively. Mild retropulsion with no associated stenosis. 2. Indeterminate fluid-sensitive signal hyperintensities in the T8 and L4 vertebrae. I suspect these are Schmorl's nodes based on comparison with prior CTs. 3. No thoracic or lumbar spinal canal or neural foraminal stenosis. Electronically Signed   By: Ulyses Jarred M.D.   On: 01/29/2021 21:34   MR LUMBAR SPINE WO CONTRAST  Result Date: 01/29/2021 CLINICAL DATA:  Mid back pain with compression fracture. EXAM: MRI THORACIC AND LUMBAR SPINE WITHOUT CONTRAST TECHNIQUE: Multiplanar and multiecho pulse sequences of the thoracic and lumbar spine were obtained without  intravenous contrast. COMPARISON:  CT abdomen pelvis 10/16/2017 CT chest 11/21/2017 CT thoracic and lumbar spine 01/29/2021 FINDINGS: MRI THORACIC SPINE FINDINGS Alignment:  Normal Vertebrae: Compression fracture of T12 with approximately 50% height loss and moderate edema. 5 mm of retropulsion. T9 hemangioma. High fluid sensitive signal within the T8 vertebral body near the inferior endplate, possibly a Schmorl's node. Cord:  Normal signal and morphology. Paraspinal and other soft tissues: Negative. Disc levels: No spinal canal stenosis. MRI LUMBAR SPINE FINDINGS Segmentation:  Standard Alignment:  Normal Vertebrae: High fluid sensitive signal focus in the L4 vertebral body. L1 compression fracture with less than 25% height loss. Minimal retropulsion. Conus medullaris and cauda equina: Conus extends to the L2 level. Conus and cauda equina appear normal. Paraspinal and other soft tissues: Negative. Disc levels: L1-L2: Normal disc space and facet joints. No spinal canal stenosis. No neural foraminal stenosis. L2-L3: Normal disc space and facet joints. No spinal canal stenosis. No neural foraminal stenosis. L3-L4: Intermediate disc bulge. No spinal canal stenosis. No neural foraminal stenosis. L4-L5: Mild facet hypertrophy with mild left asymmetric disc bulge. No spinal canal stenosis. No neural foraminal stenosis. L5-S1: Normal disc space and facet joints. No spinal  canal stenosis. No neural foraminal stenosis. Visualized sacrum: Normal. IMPRESSION: 1. Compression fractures of T12 and L1 with approximately 50% and less than 25% height loss, respectively. Mild retropulsion with no associated stenosis. 2. Indeterminate fluid-sensitive signal hyperintensities in the T8 and L4 vertebrae. I suspect these are Schmorl's nodes based on comparison with prior CTs. 3. No thoracic or lumbar spinal canal or neural foraminal stenosis. Electronically Signed   By: Ulyses Jarred M.D.   On: 01/29/2021 21:34   CT L-SPINE NO  CHARGE  Result Date: 01/29/2021 CLINICAL DATA:  Back pain. Additional provided: Patient reports back pain from an old injury. Nausea, symptoms persistent for 3 days. EXAM: CT LUMBAR SPINE WITHOUT CONTRAST TECHNIQUE: Multidetector CT imaging of the lumbar spine was performed without intravenous contrast administration. Multiplanar CT image reconstructions were also generated. COMPARISON:  CT abdomen/pelvis 01/02/2021. FINDINGS: Segmentation: 5 lumbar vertebrae. The caudal most well-formed intervertebral disc space is designated L5-S1. Alignment: Mild lumbar dextrocurvature. Unchanged mild bony retropulsion at the level of the T12 superior endplate. Minimal bony retropulsion is present at the L1 superior endplate, at site of an interval compression fracture. Trace L4-L5 grade 1 anterolisthesis, unchanged. Vertebrae: New from the prior CT abdomen/pelvis of 01/02/2021, there is a mild L2-1 vertebral compression fracture (10-20% height loss). There has also been subtle progressive interval height loss at site of a previously demonstrated T12 vertebral compression fracture (now with 60% height loss anteriorly). Vertebral body height is otherwise maintained. No fracture is identified elsewhere within the lumbar spine. Redemonstrated partial fusion across the right greater than left sacroiliac joints. Paraspinal and other soft tissues: Aortoiliac atherosclerosis. Sigmoid diverticulosis. Paraspinal soft tissues within normal limits. Disc levels: No more than mild disc degeneration at any level. T11-T12: Unchanged mild bony retropulsion at the level of the T12 superior endplate. Associated disc bulge. Mild relative narrowing of the ventral spinal canal. Mild left neural foraminal narrowing. T12-L1: Trace bony retropulsion at the level of the L1 superior endplate. Small disc bulge. No significant spinal canal stenosis. Mild/moderate bilateral neural foraminal narrowing. L1-L2: Disc bulge. No appreciable significant spinal  canal stenosis or neural foraminal narrowing. L2-L3: Disc bulge. No appreciable significant spinal canal stenosis or neural foraminal narrowing. L3-L4: Disc bulge. Facet arthrosis/ligamentum flavum hypertrophy. Mild/moderate central canal stenosis with bilateral subarticular narrowing. Mild bilateral neural foraminal narrowing. L4-L5: Trace grade 1 anterolisthesis. Disc uncovering with disc bulge. Facet arthrosis/ligamentum flavum hypertrophy. Mild/moderate central canal stenosis with bilateral subarticular narrowing. Mild bilateral neural foraminal narrowing. L5-S1: No significant disc herniation or stenosis. IMPRESSION: Mild L1 vertebral compression deformity, new as compared to the CT abdomen/pelvis of 01/02/2021 and acute/subacute. Interval progression of height loss at site of a previously demonstrated T12 compression fracture (now with up to 60% height loss). Unchanged mild bony retropulsion at the level of the T12 superior endplate. Lumbar spondylosis, as outlined. Multilevel spinal canal stenosis. Most notably, there is multifactorial apparent mild/moderate central canal stenosis at L3-L4 and L4-L5 with bilateral subarticular narrowing. Sites of mild and mild/moderate neural foraminal narrowing, as detailed. Partial fusion of the sacroiliac joints, bilaterally. Aortic Atherosclerosis (ICD10-I70.0). Sigmoid diverticulosis. Electronically Signed   By: Kellie Simmering DO   On: 01/29/2021 16:21   DG Chest Port 1 View  Result Date: 02/02/2021 CLINICAL DATA:  Rib pain after kyphoplasty today. EXAM: PORTABLE CHEST 1 VIEW COMPARISON:  Radiograph 01/10/2013, abdominal CT 01/29/2021 FINDINGS: Elevated right hemidiaphragm, with progression from prior exam. The heart is normal in size. Smooth prominence of the right mediastinum is likely due to aortic  tortuosity. No pneumothorax. Minor bibasilar atelectasis. No confluent consolidation. No pleural effusion. Kyphoplasty not visualized on the current exam. No evidence of  kyphoplasty cement in the thoracic vessels. No evidence of rib fracture. IMPRESSION: 1. Elevated right hemidiaphragm, progressed from prior exam. Minor bibasilar atelectasis. 2. Smooth prominence of the right mediastinal border is likely due to aortic tortuosity. The ascending aorta is dilated on included portion from recent abdominal CT consistent with ascending aortic aneurysm. Recommend nonemergent chest CT to assess the entire thoracic aorta. Electronically Signed   By: Keith Rake M.D.   On: 02/02/2021 20:09   IR KYPHO LUMBAR INC FX REDUCE BONE BX UNI/BIL CANNULATION INC/IMAGING  Result Date: 02/04/2021 INDICATION: Severe low back pain secondary to compression fractures at T12 and L1. EXAM: BALLOON KYPHOPLASTIES AT T12 AND L1 COMPARISON:  MRI the lumbosacral spine of Jan 29, 2021. MEDICATIONS: As antibiotic prophylaxis, none was ordered pre-procedure and administered intravenously within 1 hour of incision. ANESTHESIA/SEDATION: Moderate (conscious) sedation was employed during this procedure. A total of Versed 2 mg and Fentanyl 62.5 mcg was administered intravenously. Moderate Sedation Time: 38 minutes. The patient's level of consciousness and vital signs were monitored continuously by radiology nursing throughout the procedure under my direct supervision. FLUOROSCOPY TIME:  Fluoroscopy Time: 17 minutes 54 seconds (809.7mG y) COMPLICATIONS: None immediate. PROCEDURE: Following a full explanation of the procedure along with the potential associated complications, an informed witnessed consent was obtained. The patient was placed prone on the fluoroscopic table. The skin overlying the thoracolumbar region was then prepped and draped in the usual sterile fashion. The right pedicle at T12, and left pedicle at L1 were then infiltrated with 0.25% bupivacaine followed by the advancement of an 11-gauge Jamshidi needle through the right pedicle at T12 and the left pedicle at L1 into the posterior one-third at  the two levels. These were then exchanged for a Kyphon advanced osteo introducer system comprised of a working cannula and a Kyphon osteo drill. This combinations were then advanced over a Kyphon osteo bone pin until the tips of the Kyphon osteo drills were in the posterior third at the two levels. At this time, the bone pins were removed. In a medial trajectory, the combinations were was advanced until the tips of the working cannulae were inside the posterior one-third at the two levels. The osteo drills were removed. Through the working cannula, a Kyphon inflatable bone tamp 20 x 3 were advanced and positioned with the distal marker 5 mm from the anterior aspect of T12 and L1. Crossing of the midline was seen on the AP projection. At this time, the balloons were expanded using contrast via a Kyphon inflation syringe device via microtubing. Inflations were continued until there was apposition with the superior and the inferior endplates. At this time, methylmethacrylate mixture was reconstituted with Tobramycin in the Kyphon bone mixing device system. This was then loaded onto the Kyphon bone fillers. The balloons were deflated and removed followed by the instillation of 6 bone filler equivalents of methylmethacrylate mixture at L1, and 3 bone filler equivalents at T12 with excellent filling in the AP and lateral projections. No extravasation was noted in the disk spaces or posteriorly into the spinal canal. No epidural venous contamination was seen. The working cannulae and the bone fillers were then retrieved and removed. IMPRESSION: 1. Status post vertebral body augmentation using balloon kyphoplasty at T12 and L1 as described without event. Electronically Signed   By: [id number]$ M.D.   On: 02/02/2021 17:27  IR KYPHO EA ADDL LEVEL THORACIC OR LUMBAR  Result Date: 02/04/2021 INDICATION: Severe low back pain secondary to compression fractures at T12 and L1. EXAM: BALLOON KYPHOPLASTIES AT T12 AND L1  COMPARISON:  MRI the lumbosacral spine of Jan 29, 2021. MEDICATIONS: As antibiotic prophylaxis, none was ordered pre-procedure and administered intravenously within 1 hour of incision. ANESTHESIA/SEDATION: Moderate (conscious) sedation was employed during this procedure. A total of Versed 2 mg and Fentanyl 62.5 mcg was administered intravenously. Moderate Sedation Time: 38 minutes. The patient's level of consciousness and vital signs were monitored continuously by radiology nursing throughout the procedure under my direct supervision. FLUOROSCOPY TIME:  Fluoroscopy Time: 17 minutes 54 seconds (809.7mG y) COMPLICATIONS: None immediate. PROCEDURE: Following a full explanation of the procedure along with the potential associated complications, an informed witnessed consent was obtained. The patient was placed prone on the fluoroscopic table. The skin overlying the thoracolumbar region was then prepped and draped in the usual sterile fashion. The right pedicle at T12, and left pedicle at L1 were then infiltrated with 0.25% bupivacaine followed by the advancement of an 11-gauge Jamshidi needle through the right pedicle at T12 and the left pedicle at L1 into the posterior one-third at the two levels. These were then exchanged for a Kyphon advanced osteo introducer system comprised of a working cannula and a Kyphon osteo drill. This combinations were then advanced over a Kyphon osteo bone pin until the tips of the Kyphon osteo drills were in the posterior third at the two levels. At this time, the bone pins were removed. In a medial trajectory, the combinations were was advanced until the tips of the working cannulae were inside the posterior one-third at the two levels. The osteo drills were removed. Through the working cannula, a Kyphon inflatable bone tamp 20 x 3 were advanced and positioned with the distal marker 5 mm from the anterior aspect of T12 and L1. Crossing of the midline was seen on the AP projection. At this  time, the balloons were expanded using contrast via a Kyphon inflation syringe device via microtubing. Inflations were continued until there was apposition with the superior and the inferior endplates. At this time, methylmethacrylate mixture was reconstituted with Tobramycin in the Kyphon bone mixing device system. This was then loaded onto the Kyphon bone fillers. The balloons were deflated and removed followed by the instillation of 6 bone filler equivalents of methylmethacrylate mixture at L1, and 3 bone filler equivalents at T12 with excellent filling in the AP and lateral projections. No extravasation was noted in the disk spaces or posteriorly into the spinal canal. No epidural venous contamination was seen. The working cannulae and the bone fillers were then retrieved and removed. IMPRESSION: 1. Status post vertebral body augmentation using balloon kyphoplasty at T12 and L1 as described without event. Electronically Signed   By: [id number]$ M.D.   On: 02/02/2021 17:27   LONG TERM MONITOR (3-14 DAYS)  Result Date: 01/21/2021  The patient was monitored for 4 days, 3 hours.  The predominant rhythm was sinus with an average rate of 86 bpm (range 56-128 bpm and sinus).  There were rare PACs and PVCs.  165 atrial runs were observed, lasting up to 16.8 seconds with a maximum rate of 193 bpm.  No sustained arrhythmia or prolonged pause was observed.  Patient triggered event corresponds to sinus rhythm.  Predominately sinus rhythm with rare PACs and PVCs, as well as multiple episodes of PSVT lasting up to 17 seconds.  Subjective: Patient seen examined at bedside, resting comfortably.  Pain slowly continues to improve daily.  Discharging to SNF today.  No other questions or concerns at this time.  Denies headache, no dizziness, no chest pain, palpitations, no shortness of breath, no abdominal pain, no weakness, no fatigue, no paresthesias.  No acute events overnight per nursing  staff.  Discharge Exam: Vitals:   02/07/21 0413 02/07/21 0842  BP: (!) 169/79 (!) 141/78  Pulse: (!) 54 75  Resp: 13 15  Temp:  97.8 F (36.6 C)  SpO2: 96% 95%   Vitals:   02/06/21 2002 02/07/21 0026 02/07/21 0413 02/07/21 0842  BP: 129/68 124/72 (!) 169/79 (!) 141/78  Pulse: (!) 55 (!) 52 (!) 54 75  Resp: 19 12 13 15   Temp: 97.7 F (36.5 C) 97.6 F (36.4 C)  97.8 F (36.6 C)  TempSrc: Oral Oral  Oral  SpO2: 96% 96% 96% 95%  Weight:      Height:        General: Pt is alert, awake, not in acute distress, chronically ill in appearance Cardiovascular: RRR, S1/S2 +, no rubs, no gallops Respiratory: CTA bilaterally, no wheezing, no rhonchi, on room air Abdominal: Soft, NT, ND, bowel sounds + Extremities: no edema, no cyanosis    The results of significant diagnostics from this hospitalization (including imaging, microbiology, ancillary and laboratory) are listed below for reference.     Microbiology: Recent Results (from the past 240 hour(s))  Surgical pcr screen     Status: None   Collection Time: 01/30/21  5:20 AM   Specimen: Nasal Mucosa; Nasal Swab  Result Value Ref Range Status   MRSA, PCR NEGATIVE NEGATIVE Final   Staphylococcus aureus NEGATIVE NEGATIVE Final    Comment: (NOTE) The Xpert SA Assay (FDA approved for NASAL specimens in patients 34 years of age and older), is one component of a comprehensive surveillance program. It is not intended to diagnose infection nor to guide or monitor treatment. Performed at [facility], [place] [address] [place], [place] [address]   SARS CORONAVIRUS 2 (TAT 6-24 HRS) Nasopharyngeal Nasopharyngeal Swab     Status: None   Collection Time: 02/04/21  2:52 PM   Specimen: Nasopharyngeal Swab  Result Value Ref Range Status   SARS Coronavirus 2 NEGATIVE NEGATIVE Final    Comment: (NOTE) SARS-CoV-2 target nucleic acids are NOT DETECTED.  The SARS-CoV-2 RNA is generally detectable in upper and lower respiratory  specimens during the acute phase of infection. Negative results do not preclude SARS-CoV-2 infection, do not rule out co-infections with other pathogens, and should not be used as the sole basis for treatment or other patient management decisions. Negative results must be combined with clinical observations, patient history, and epidemiological information. The expected result is Negative.  Fact Sheet for Patients: SugarRoll.be  Fact Sheet for Healthcare Providers: https://www.woods-mathews.com/  This test is not yet approved or cleared by the [place] FDA and  has been authorized for detection and/or diagnosis of SARS-CoV-2 by FDA under an Emergency Use Authorization (EUA). This EUA will remain  in effect (meaning this test can be used) for the duration of the COVID-19 declaration under Se ction 564(b)(1) of the Act, 21 U.S.C. section 360bbb-3(b)(1), unless the authorization is terminated or revoked sooner.  Performed at [facility], [place] [address], [place], [place] [address]   SARS CORONAVIRUS 2 (TAT 6-24 HRS) Nasopharyngeal Nasopharyngeal Swab     Status: None   Collection Time: 02/06/21  1:35  PM   Specimen: Nasopharyngeal Swab  Result Value Ref Range Status   SARS Coronavirus 2 NEGATIVE NEGATIVE Final    Comment: (NOTE) SARS-CoV-2 target nucleic acids are NOT DETECTED.  The SARS-CoV-2 RNA is generally detectable in upper and lower respiratory specimens during the acute phase of infection. Negative results do not preclude SARS-CoV-2 infection, do not rule out co-infections with other pathogens, and should not be used as the sole basis for treatment or other patient management decisions. Negative results must be combined with clinical observations, patient history, and epidemiological information. The expected result is Negative.  Fact Sheet for Patients: SugarRoll.be  Fact Sheet for  Healthcare Providers: https://www.woods-mathews.com/  This test is not yet approved or cleared by the [place] FDA and  has been authorized for detection and/or diagnosis of SARS-CoV-2 by FDA under an Emergency Use Authorization (EUA). This EUA will remain  in effect (meaning this test can be used) for the duration of the COVID-19 declaration under Se ction 564(b)(1) of the Act, 21 U.S.C. section 360bbb-3(b)(1), unless the authorization is terminated or revoked sooner.  Performed at [facility], [place] [address], [place], [place] [address]      Labs: BNP (last 3 results) No results for input(s): BNP in the last 8760 hours. Basic Metabolic Panel: Recent Labs  Lab 02/02/21 0355 02/03/21 0207 02/04/21 0141 02/05/21 0314 02/05/21 1920 02/06/21 0244  NA 138 141 139 140  --  139  K 4.4 4.3 4.0 4.2  --  3.8  CL 102 104 104 104  --  106  CO2 28 28 28 29   --  26  GLUCOSE 110* 122* 112* 109*  --  107*  BUN 29* 26* 32* 32*  --  36*  CREATININE 2.21* 2.46* 2.35* 2.30*  --  2.31*  CALCIUM 10.5* 10.5* 10.4* 11.4* 10.4* 9.7   Liver Function Tests: No results for input(s): AST, ALT, ALKPHOS, BILITOT, PROT, ALBUMIN in the last 168 hours. No results for input(s): LIPASE, AMYLASE in the last 168 hours. No results for input(s): AMMONIA in the last 168 hours. CBC: Recent Labs  Lab 02/01/21 0228 02/02/21 0355 02/03/21 0207 02/04/21 0141 02/05/21 0314  WBC 5.3 4.7 5.2 4.1 3.6*  HGB 11.4* 11.1* 11.6* 10.0* 10.1*  HCT 35.1* 34.9* 35.9* 30.8* 31.1*  MCV 93.4 94.3 94.2 94.2 93.7  PLT 232 228 238 229 226   Cardiac Enzymes: No results for input(s): CKTOTAL, CKMB, CKMBINDEX, TROPONINI in the last 168 hours. BNP: Invalid input(s): POCBNP CBG: No results for input(s): GLUCAP in the last 168 hours. D-Dimer No results for input(s): DDIMER in the last 72 hours. Hgb A1c No results for input(s): HGBA1C in the last 72 hours. Lipid Profile No results for input(s):  CHOL, HDL, LDLCALC, TRIG, CHOLHDL, LDLDIRECT in the last 72 hours. Thyroid function studies No results for input(s): TSH, T4TOTAL, T3FREE, THYROIDAB in the last 72 hours.  Invalid input(s): FREET3 Anemia work up No results for input(s): VITAMINB12, FOLATE, FERRITIN, TIBC, IRON, RETICCTPCT in the last 72 hours. Urinalysis    Component Value Date/Time   COLORURINE YELLOW 01/29/2021 1443   APPEARANCEUR CLOUDY (A) 01/29/2021 1443   APPEARANCEUR Clear 12/25/2020 1037   LABSPEC 1.014 01/29/2021 1443   PHURINE 8.0 01/29/2021 1443   GLUCOSEU NEGATIVE 01/29/2021 1443   [place] 01/29/2021 [place] 01/29/2021 1443   BILIRUBINUR Negative 12/25/2020 [place] 01/29/2021 1443   PROTEINUR 100 (A) 01/29/2021 1443   NITRITE NEGATIVE 01/29/2021 1443  LEUKOCYTESUR NEGATIVE 01/29/2021 1443   Sepsis Labs Invalid input(s): PROCALCITONIN,  WBC,  LACTICIDVEN Microbiology Recent Results (from the past 240 hour(s))  Surgical pcr screen     Status: None   Collection Time: 01/30/21  5:20 AM   Specimen: Nasal Mucosa; Nasal Swab  Result Value Ref Range Status   MRSA, PCR NEGATIVE NEGATIVE Final   Staphylococcus aureus NEGATIVE NEGATIVE Final    Comment: (NOTE) The Xpert SA Assay (FDA approved for NASAL specimens in patients 75 years of age and older), is one component of a comprehensive surveillance program. It is not intended to diagnose infection nor to guide or monitor treatment. Performed at [facility], [place] [address], [place], [place] [address]   SARS CORONAVIRUS 2 (TAT 6-24 HRS) Nasopharyngeal Nasopharyngeal Swab     Status: None   Collection Time: 02/04/21  2:52 PM   Specimen: Nasopharyngeal Swab  Result Value Ref Range Status   SARS Coronavirus 2 NEGATIVE NEGATIVE Final    Comment: (NOTE) SARS-CoV-2 target nucleic acids are NOT DETECTED.  The SARS-CoV-2 RNA is generally detectable in upper and lower respiratory specimens during the acute  phase of infection. Negative results do not preclude SARS-CoV-2 infection, do not rule out co-infections with other pathogens, and should not be used as the sole basis for treatment or other patient management decisions. Negative results must be combined with clinical observations, patient history, and epidemiological information. The expected result is Negative.  Fact Sheet for Patients: SugarRoll.be  Fact Sheet for Healthcare Providers: https://www.woods-mathews.com/  This test is not yet approved or cleared by the [place] FDA and  has been authorized for detection and/or diagnosis of SARS-CoV-2 by FDA under an Emergency Use Authorization (EUA). This EUA will remain  in effect (meaning this test can be used) for the duration of the COVID-19 declaration under Se ction 564(b)(1) of the Act, 21 U.S.C. section 360bbb-3(b)(1), unless the authorization is terminated or revoked sooner.  Performed at [facility], [place] [address] [place], [place] [address]   SARS CORONAVIRUS 2 (TAT 6-24 HRS) Nasopharyngeal Nasopharyngeal Swab     Status: None   Collection Time: 02/06/21  1:35 PM   Specimen: Nasopharyngeal Swab  Result Value Ref Range Status   SARS Coronavirus 2 NEGATIVE NEGATIVE Final    Comment: (NOTE) SARS-CoV-2 target nucleic acids are NOT DETECTED.  The SARS-CoV-2 RNA is generally detectable in upper and lower respiratory specimens during the acute phase of infection. Negative results do not preclude SARS-CoV-2 infection, do not rule out co-infections with other pathogens, and should not be used as the sole basis for treatment or other patient management decisions. Negative results must be combined with clinical observations, patient history, and epidemiological information. The expected result is Negative.  Fact Sheet for Patients: SugarRoll.be  Fact Sheet for Healthcare  Providers: https://www.woods-mathews.com/  This test is not yet approved or cleared by the [place] FDA and  has been authorized for detection and/or diagnosis of SARS-CoV-2 by FDA under an Emergency Use Authorization (EUA). This EUA will remain  in effect (meaning this test can be used) for the duration of the COVID-19 declaration under Se ction 564(b)(1) of the Act, 21 U.S.C. section 360bbb-3(b)(1), unless the authorization is terminated or revoked sooner.  Performed at [facility], [place] [address] [place], [place] [address]      Time coordinating discharge: Over 30 minutes  SIGNED:   Zackeriah Kissler J [place] DO  [facility] 02/07/2021, 10:26 AM

## 2021-02-10 ENCOUNTER — Telehealth: Payer: Self-pay | Admitting: *Deleted

## 2021-02-10 LAB — MULTIPLE MYELOMA PANEL, SERUM
Albumin SerPl Elph-Mcnc: 3 g/dL (ref 2.9–4.4)
Albumin/Glob SerPl: 1.3 (ref 0.7–1.7)
Alpha 1: 0.3 g/dL (ref 0.0–0.4)
Alpha2 Glob SerPl Elph-Mcnc: 1 g/dL (ref 0.4–1.0)
B-Globulin SerPl Elph-Mcnc: 0.7 g/dL (ref 0.7–1.3)
Gamma Glob SerPl Elph-Mcnc: 0.4 g/dL (ref 0.4–1.8)
Globulin, Total: 2.4 g/dL (ref 2.2–3.9)
IgA: 16 mg/dL — ABNORMAL LOW (ref 61–437)
IgG (Immunoglobin G), Serum: 345 mg/dL — ABNORMAL LOW (ref 603–1613)
IgM (Immunoglobulin M), Srm: 19 mg/dL (ref 15–143)
M Protein SerPl Elph-Mcnc: 0.2 g/dL — ABNORMAL HIGH
Total Protein ELP: 5.4 g/dL — ABNORMAL LOW (ref 6.0–8.5)

## 2021-02-10 NOTE — Telephone Encounter (Signed)
-----   Message from Arvil Chaco, PA-C sent at 01/22/2021  2:36 PM EDT ----- Monitor was placed before discharge for possible atrial fibrillation and showed  --Normal sinus rhythm of a normal heart rhythm with average rate 86 bpm and range 56 to 128 bpm. --Rare extra beats from the top and bottom part of the heart. --165 runs of a faster rhythm from the top part of the heart, as long as 16.8 seconds and as fast as 192 bpm. --No sustained arrhythmia or prolonged pauses. --Triggered events corresponded to a normal heart rhythm.  Paroxysmal SVT was noted on monitor - no clear evidence of atrial fibrillation. When the patient sees [name], NP, we can reassess symptoms, given the frequency of atrial runs --if very symptomatic, the patient may want to consider referral to EP.  Will Cc Cadence on this note as FYI.

## 2021-02-10 NOTE — Telephone Encounter (Signed)
Attempted to call pt to review zio monitor results.  No answer. Lmtcb.

## 2021-02-11 DIAGNOSIS — M6281 Muscle weakness (generalized): Secondary | ICD-10-CM | POA: Diagnosis not present

## 2021-02-11 DIAGNOSIS — M5459 Other low back pain: Secondary | ICD-10-CM | POA: Diagnosis not present

## 2021-02-11 DIAGNOSIS — R262 Difficulty in walking, not elsewhere classified: Secondary | ICD-10-CM | POA: Diagnosis not present

## 2021-02-11 DIAGNOSIS — R5381 Other malaise: Secondary | ICD-10-CM | POA: Diagnosis not present

## 2021-02-12 LAB — UPEP/UIFE/LIGHT CHAINS/TP, 24-HR UR
% BETA, Urine: 7.3 %
ALPHA 1 URINE: 1 %
Albumin, U: 4.7 %
Alpha 2, Urine: 2.4 %
Free Kappa Lt Chains,Ur: <0.28 mg/L — ABNORMAL LOW (ref 1.17–86.46)
Free Kappa/Lambda Ratio: <0.01 — ABNORMAL LOW (ref 1.83–14.26)
Free Lambda Lt Chains,Ur: 24.23 mg/L — ABNORMAL HIGH (ref 0.27–15.21)
GAMMA GLOBULIN URINE: 84.6 %
M-SPIKE %, Urine: 80.6 % — ABNORMAL HIGH
M-Spike, Mg/24 Hr: 3100 mg/24 hr — ABNORMAL HIGH
Total Protein, Urine-Ur/day: 3846 mg/24 hr — ABNORMAL HIGH (ref 30–150)
Total Protein, Urine: 480.7 mg/dL
Total Volume: 800

## 2021-02-13 NOTE — Telephone Encounter (Signed)
-----   Message from Arvil Chaco, PA-C sent at 01/22/2021  2:36 PM EDT ----- Monitor was placed before discharge for possible atrial fibrillation and showed  --Normal sinus rhythm of a normal heart rhythm with average rate 86 bpm and range 56 to 128 bpm. --Rare extra beats from the top and bottom part of the heart. --165 runs of a faster rhythm from the top part of the heart, as long as 16.8 seconds and as fast as 192 bpm. --No sustained arrhythmia or prolonged pauses. --Triggered events corresponded to a normal heart rhythm.  Paroxysmal SVT was noted on monitor - no clear evidence of atrial fibrillation. When the patient sees [name], NP, we can reassess symptoms, given the frequency of atrial runs --if very symptomatic, the patient may want to consider referral to EP.  Will Cc Cadence on this note as FYI.

## 2021-02-13 NOTE — Telephone Encounter (Signed)
Spoke to pt. Notified of zio monitor results and provider's recc.  Pt voiced understanding. Pt also states that he is unable to keep appt on 6/14 as he is currently in rehab s/p lumbar surgery.  Pt states he is unsure of discharge date at this time and would like to reschedule his appt at this time.  Will also make Jacquelyn aware in the case that a virtual visit may be appropriate at a later date.  Otherwise pt will r/s for in person visit when he is able.

## 2021-02-14 ENCOUNTER — Other Ambulatory Visit: Payer: Self-pay | Admitting: Internal Medicine

## 2021-02-14 ENCOUNTER — Encounter: Payer: Self-pay | Admitting: Internal Medicine

## 2021-02-14 DIAGNOSIS — D472 Monoclonal gammopathy: Secondary | ICD-10-CM

## 2021-02-14 NOTE — Progress Notes (Signed)
Palliative Care  Post-hospitalization review. Palliative Consult for pain and symptom management. This patient had lab work up for hypercalcemia, worsening renal failure, abnormal urine protein and unexplained weight loss with fractures pending at the time of discharge. He discharged to [facility] on 5/27. At the time of my review labs indicated the possibility of Multiple Myeloma, with + M spike on SPEP and UPEP with [employer] Proteins. I contacted his wife to determine if these lab results had been discussed with them and to check on his pain and progress. She saw the labs on My Chart and has been trying to get someone at [facility] facility to address them or explain them but has not been able to get help or explanation. Fortunately she reports his functional status has improved but they have to be vigilant about making sure his medications are given correctly at the SNF. His pain is still not well controlled but better than during hospitalization-it is limiting therapy.  I discussed the following plan with her:  1. Discontinue any resistive therapies or high risk activity that may cause pathologic fracture. 2. Must continue Calcitonin-nursing has not been administering this correctly -continue scheduled pain medications for now. 3. I discussed with her my concerns for a diagnosis of multiple myeloma and recommended additional evaluation-this gentleman was extremely high functioning just a few moths ago and has actually improved his strength over the past week. His wife reports he is really ready to come home but the facility PT recommended one more week and feel his pain control is the limiting factor.  4. I have communicated with his PCP and other members of his care team. Unable to get in touch with anyone at [facility] but his wife was able to get them to hold any resistive PT for now.  5. I recommended evaluation at [facility] and I would also like for him to see Billey Chang our palliative  provider at [facility] for symptom management and care coordination support as soon as possible.   Lane Hacker, DO Palliative Medicine

## 2021-02-16 NOTE — Telephone Encounter (Signed)
Called patient, disconnected before able to reschedule

## 2021-02-17 ENCOUNTER — Other Ambulatory Visit: Payer: Self-pay | Admitting: Internal Medicine

## 2021-02-17 ENCOUNTER — Encounter: Payer: Self-pay | Admitting: Internal Medicine

## 2021-02-17 ENCOUNTER — Other Ambulatory Visit: Payer: Self-pay | Admitting: Urology

## 2021-02-17 DIAGNOSIS — Z515 Encounter for palliative care: Secondary | ICD-10-CM | POA: Insufficient documentation

## 2021-02-17 MED ORDER — TRAMADOL HCL 50 MG PO TABS: 50.0000 mg | ORAL_TABLET | Freq: Four times a day (QID) | ORAL | 0 refills | Status: DC

## 2021-02-17 MED FILL — ELIQUIS 2.5 MG TABLET: 30 days supply | Qty: 60 | Fill #0

## 2021-02-17 MED FILL — CARVEDILOL 6.25 MG TABLET: 30 days supply | Qty: 60 | Fill #0

## 2021-02-17 MED FILL — TRAMADOL HCL 50 MG TABLET: 30 days supply | Qty: 120 | Fill #0

## 2021-02-17 NOTE — Telephone Encounter (Signed)
This patient is not well known to me, as I have not seen the patient before and only ordered the monitor to be placed at discharge per the rounding MD's request.  On review of chart, it appears that he was seen after discharge by our team in [place] and found to have Afib during the admission. He converted on his own and it was thought his Afib was due to his compression fracture of L1. Surgery was performed that admission on his back. He was started on IV amiodarone and then switched to oral taper at discharge with plan to discharge home on anticoagulation. Future ischemic evaluation was recommended at that time.    Given the above, he will need seen in person to  1) check monitoring labs (CBC, BMET) after start of anticoagulation  2) monitor QTc on amiodarone and check maintenance labs on this medication 3) ensure he is maintaining NSR on amiodarone 4) discuss further ischemic workup  In person visit recommended. If he is still in rehab and in pain, we can try to arrange repeat labs via his rehab facility and see if they are capable of getting an EKG.   Will Cc Dr. Saunders Revel for any additional recommendations, if advised.

## 2021-02-18 ENCOUNTER — Other Ambulatory Visit: Payer: Self-pay | Admitting: Internal Medicine

## 2021-02-18 MED ORDER — ONDANSETRON HCL 4 MG PO TABS: 4.0000 mg | ORAL_TABLET | Freq: Four times a day (QID) | ORAL | 0 refills | Status: DC

## 2021-02-18 MED FILL — ONDANSETRON HCL 4 MG TABLET: 30 days supply | Qty: 60 | Fill #0

## 2021-02-18 NOTE — Telephone Encounter (Signed)
Was able to reach out to Jack [name] this am, spoke with him and his wife Jack [name]. Confirmed appointment for 6/14 at 10 am in person visit.  Jack [name] stated he has been having a "hard time" since his hospital admission, continues to be in pain but wife reports he will be there "in person" for f/u with Jack [name], Jack [name] over with Mr. & Mrs. [name] regarding Jack [name]'s PA-C recommendation after reviewing his chart  On review of chart, it appears that he was seen after discharge by our team in [place] and found to have Afib during the admission. He converted on his own and it was thought his Afib was due to his compression fracture of L1. Surgery was performed that admission on his back. He was started on IV amiodarone and then switched to oral taper at discharge with plan to discharge home on anticoagulation. Future ischemic evaluation was recommended at that time.    Given the above, he will need seen in person to  1) check monitoring labs (CBC, BMET) after start of anticoagulation  2) monitor QTc on amiodarone and check maintenance labs on this medication 3) ensure he is maintaining NSR on amiodarone 4) discuss further ischemic workup  In person visit recommended. If he is still in rehab and in pain, we can try to arrange repeat labs via his rehab facility and see if they are capable of getting an EKG.   Jack [name] reports has an oncology appt tomorrow and will labs drawn then, reports our office will be able to review those results through the EMR Epic system. Advised if they could also get an updated EKG while in clinic, Jack [name] stated they will recommend this as they have done EKG in the past. Also, reported that we will be able to review EKG when uploaded into the system so Jack [place], PA-C can review at upcoming appt and compare to EKG day of appt.    Wife Jack [name] reports Jack [name] is not taking the amiodarone or the hydralazine, they have not picked up these  medications and feel that it "is too much right now for Rj". Advised the amiodarone is to help control the outbreaks in a-fib and arrhthymias and hydralazine is to help control HTN with BP.  Both report understanding, but are not wanting to utilizile these meds at current, want to discuss further at upcoming appt.   Pt reports is taking his Eliquis (will start today) 2.5 mg BID and is taking the Coreg and Lipitor.   Advised to monitor BP and HR if able, reiterated labs and EKG tomorrow at cancer center during appt. Both verbalized understanding, all questions and concern were address, nothing further at this time, will be at f/u appt next week to discuss meds and plan of care further.

## 2021-02-18 NOTE — Progress Notes (Signed)
Call received from patient's wife. He is having increasing nausea. Will send in script for zofran and I have advised her to take zofran on a scheduled basis. He has had regular bowel movements.Advised continued hydration. Appt tomorrow in [facility].  Lane Hacker, DO Palliative Medicine

## 2021-02-19 ENCOUNTER — Ambulatory Visit
Admission: RE | Admit: 2021-02-19 | Discharge: 2021-02-19 | Disposition: A | Discharge status: Home / Self Care | Payer: PPO | Source: Ambulatory Visit | Attending: Oncology | Admitting: Oncology

## 2021-02-19 ENCOUNTER — Inpatient Hospital Stay (HOSPITAL_BASED_OUTPATIENT_CLINIC_OR_DEPARTMENT_OTHER): Payer: PPO | Admitting: Hospice and Palliative Medicine

## 2021-02-19 ENCOUNTER — Inpatient Hospital Stay: Payer: PPO

## 2021-02-19 ENCOUNTER — Other Ambulatory Visit: Payer: Self-pay | Admitting: *Deleted

## 2021-02-19 ENCOUNTER — Inpatient Hospital Stay: Payer: PPO | Attending: Oncology | Admitting: Oncology

## 2021-02-19 ENCOUNTER — Other Ambulatory Visit: Payer: Self-pay

## 2021-02-19 VITALS — BP 127/96 | HR 76 | Temp 96.2°F | Resp 76 | Wt 175.7 lb

## 2021-02-19 DIAGNOSIS — I4891 Unspecified atrial fibrillation: Secondary | ICD-10-CM | POA: Diagnosis not present

## 2021-02-19 DIAGNOSIS — R531 Weakness: Secondary | ICD-10-CM

## 2021-02-19 DIAGNOSIS — L570 Actinic keratosis: Secondary | ICD-10-CM | POA: Insufficient documentation

## 2021-02-19 DIAGNOSIS — D472 Monoclonal gammopathy: Secondary | ICD-10-CM

## 2021-02-19 DIAGNOSIS — I129 Hypertensive chronic kidney disease with stage 1 through stage 4 chronic kidney disease, or unspecified chronic kidney disease: Secondary | ICD-10-CM | POA: Diagnosis not present

## 2021-02-19 DIAGNOSIS — R52 Pain, unspecified: Secondary | ICD-10-CM | POA: Insufficient documentation

## 2021-02-19 DIAGNOSIS — D649 Anemia, unspecified: Secondary | ICD-10-CM | POA: Insufficient documentation

## 2021-02-19 DIAGNOSIS — K4090 Unilateral inguinal hernia, without obstruction or gangrene, not specified as recurrent: Secondary | ICD-10-CM | POA: Insufficient documentation

## 2021-02-19 DIAGNOSIS — M5430 Sciatica, unspecified side: Secondary | ICD-10-CM | POA: Insufficient documentation

## 2021-02-19 DIAGNOSIS — Z87891 Personal history of nicotine dependence: Secondary | ICD-10-CM | POA: Insufficient documentation

## 2021-02-19 DIAGNOSIS — R200 Anesthesia of skin: Secondary | ICD-10-CM | POA: Insufficient documentation

## 2021-02-19 DIAGNOSIS — N1832 Chronic kidney disease, stage 3b: Secondary | ICD-10-CM | POA: Diagnosis not present

## 2021-02-19 DIAGNOSIS — H353190 Nonexudative age-related macular degeneration, unspecified eye, stage unspecified: Secondary | ICD-10-CM | POA: Insufficient documentation

## 2021-02-19 DIAGNOSIS — Z7901 Long term (current) use of anticoagulants: Secondary | ICD-10-CM

## 2021-02-19 DIAGNOSIS — Z515 Encounter for palliative care: Secondary | ICD-10-CM

## 2021-02-19 DIAGNOSIS — N289 Disorder of kidney and ureter, unspecified: Secondary | ICD-10-CM | POA: Diagnosis not present

## 2021-02-19 DIAGNOSIS — S22080A Wedge compression fracture of T11-T12 vertebra, initial encounter for closed fracture: Secondary | ICD-10-CM | POA: Diagnosis not present

## 2021-02-19 DIAGNOSIS — M25519 Pain in unspecified shoulder: Secondary | ICD-10-CM | POA: Insufficient documentation

## 2021-02-19 LAB — BASIC METABOLIC PANEL
Anion gap: 13 (ref 5–15)
BUN: 23 mg/dL (ref 8–23)
CO2: 25 mmol/L (ref 22–32)
Calcium: 10 mg/dL (ref 8.9–10.3)
Chloride: 96 mmol/L — ABNORMAL LOW (ref 98–111)
Creatinine, Ser: 1.9 mg/dL — ABNORMAL HIGH (ref 0.61–1.24)
GFR, Estimated: 34 mL/min — ABNORMAL LOW (ref >60)
Glucose, Bld: 90 mg/dL (ref 70–99)
Potassium: 4.2 mmol/L (ref 3.5–5.1)
Sodium: 134 mmol/L — ABNORMAL LOW (ref 135–145)

## 2021-02-19 LAB — CBC
HCT: 34.2 % — ABNORMAL LOW (ref 39.0–52.0)
Hemoglobin: 11.2 g/dL — ABNORMAL LOW (ref 13.0–17.0)
MCH: 30.6 pg (ref 26.0–34.0)
MCHC: 32.7 g/dL (ref 30.0–36.0)
MCV: 93.4 fL (ref 80.0–100.0)
Platelets: 292 10*3/uL (ref 150–400)
RBC: 3.66 MIL/uL — ABNORMAL LOW (ref 4.22–5.81)
RDW: 15.7 % — ABNORMAL HIGH (ref 11.5–15.5)
WBC: 3.6 10*3/uL — ABNORMAL LOW (ref 4.0–10.5)
nRBC: 0 % (ref 0.0–0.2)

## 2021-02-19 NOTE — Progress Notes (Signed)
[place]  Telephone:([phone] [phone]   Name: BRIT WERNETTE Date: 02/19/2021 MRN: [id number]  DOB: 1933-05-01  Patient Care Team: [name], MD as PCP - General (Family Medicine) End, [name], MD as PCP - Cardiology (Cardiology) Christene Lye, MD (General Surgery) [name], MD (Endocrinology)    REASON FOR CONSULTATION: JONMICHAEL BEADNELL is a 87 y.o. male with multiple medical problems including hypertension, CKD stage IIIb, A. fib on Eliquis, and anemia.  Patient was hospitalized 01/02/2021 to 01/07/2021 with T12 compression fracture with recommendation for conservative management including use of a TLSO brace.  Patient was hospitalized again 01/29/2021 to 02/07/2021 with recurrent back pain and MR showing T12 and new L1 compression fracture.  Patient underwent T12/L1 kyphoplasty on 02/02/2021.  There is also suspicion of a left fifth rib fracture, which occurred while transitioning from the stretcher while in the hospital.  Patient had mild hypercalcemia with SPEP and UPEP concerning for possible myeloma.  Patient was referred to [facility] for work-up.  Palliative care was consulted to help address goals and manage ongoing symptoms.  SOCIAL HISTORY:     reports that he quit smoking about 55 years ago. He has never used smokeless tobacco. He reports current alcohol use of about 1.0 standard drink of alcohol per week. He reports that he does not use drugs.  Patient lives at [address] in [place] with his wife of 19 years.  Patient has a son and daughter who are also involved in his care.  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  ADVANCE DIRECTIVES:  Has ACP documents but none on file  CODE STATUS:   PAST MEDICAL HISTORY: Past Medical History:  Diagnosis Date   Elevated prostate specific antigen (PSA)    has been 7 for a year    GERD  (gastroesophageal reflux disease)    History of colon polyps 2008   [facility],    History of kidney stones    Hyperlipidemia    Hypertension    Prostate hypertrophy    diagnosed at age 49 due to hematospermia   Renal disorder     PAST SURGICAL HISTORY:  Past Surgical History:  Procedure Laterality Date   CATARACT EXTRACTION W/PHACO Left 01/10/2018   Procedure: CATARACT EXTRACTION PHACO AND INTRAOCULAR LENS PLACEMENT ([place]);  Surgeon: Birder Robson, MD;  Location: [facility] ORS;  Service: Ophthalmology;  Laterality: Left;  [name] 00:24.8 AP% 14.9 CDE 3.68 Fluid pack lot # [id number]   CATARACT EXTRACTION W/PHACO Right 01/25/2018   Procedure: CATARACT EXTRACTION PHACO AND INTRAOCULAR LENS PLACEMENT ([facility]);  Surgeon: Birder Robson, MD;  Location: [facility] ORS;  Service: Ophthalmology;  Laterality: Right;  [name] 00:42 AP% 10.8 CDE 4.59 Fluid pack lot # [id number]   COLON SURGERY     CYSTOSCOPY W/ URETERAL STENT PLACEMENT Right 10/16/2017   Procedure: right  URETERAL STENT PLACEMENT,cystoscopy bilateral stent removal,rretrograde;  Surgeon: Abbie Sons, MD;  Location: [facility] ORS;  Service: Urology;  Laterality: Right;   CYSTOSCOPY/URETEROSCOPY/HOLMIUM LASER/STENT PLACEMENT Right 12/16/2020   Procedure: CYSTOSCOPY/URETEROSCOPY/HOLMIUM LASER/STENT PLACEMENT;  Surgeon: Abbie Sons, MD;  Location: [facility] ORS;  Service: Urology;  Laterality: Right;   EXTRACORPOREAL SHOCK WAVE LITHOTRIPSY Right 12/11/2020   Procedure: EXTRACORPOREAL SHOCK WAVE LITHOTRIPSY (ESWL);  Surgeon: Abbie Sons, MD;  Location: [facility] ORS;  Service: Urology;  Laterality: Right;   IR KYPHO EA ADDL LEVEL THORACIC OR LUMBAR  02/02/2021   IR KYPHO  LUMBAR INC FX REDUCE BONE BX UNI/BIL CANNULATION INC/IMAGING  02/02/2021   KIDNEY STONE SURGERY     RESECTION SOFT TISSUE TUMOR LEG / ANKLE RADICAL  jan 2009   [facility],  right thigh/knee , nonmalignant   SMALL INTESTINE SURGERY  1946   implaed on picket fence, punctured stomach    TONSILLECTOMY      HEMATOLOGY/ONCOLOGY HISTORY:  Oncology History   No history exists.    ALLERGIES:  is allergic to amlodipine, azithromycin, and lisinopril.  MEDICATIONS:  Current Outpatient Medications  Medication Sig Dispense Refill   acetaminophen (TYLENOL) 325 MG tablet Take 2 tablets (650 mg total) by mouth every 6 (six) hours as needed for fever, headache or moderate pain.     amiodarone (PACERONE) 200 MG tablet Take 1 tablet (200 mg total) by mouth daily for 20 days. (Patient not taking: Reported on 02/19/2021) 20 tablet 0   apixaban (ELIQUIS) 2.5 MG TABS tablet Take 1 tablet (2.5 mg total) by mouth 2 (two) times daily. 60 tablet    Ascorbic Acid (VITAMIN C) 1000 MG tablet Take 1,000 mg by mouth daily. (Patient not taking: Reported on 02/19/2021)     atorvastatin (LIPITOR) 40 MG tablet Take 1 tablet (40 mg total) by mouth daily at 6 PM. 30 tablet 0   calcitonin, salmon, (MIACALCIN/FORTICAL) 200 UNIT/ACT nasal spray Place 1 spray into alternate nostrils daily. 3.7 mL 12   carvedilol (COREG) 6.25 MG tablet Take 1 tablet (6.25 mg total) by mouth 2 (two) times daily with a meal.     cholecalciferol (VITAMIN D) 1000 UNITS tablet Take 1,000 Units by mouth daily. (Patient not taking: Reported on 02/19/2021)     Cyanocobalamin (VITAMIN B-12 PO) Take 1 tablet by mouth daily. (Patient not taking: Reported on 02/19/2021)     docusate sodium (COLACE) 100 MG capsule Take 1 capsule (100 mg total) by mouth 2 (two) times daily. 60 capsule 0   gabapentin (NEURONTIN) 100 MG capsule Take 100 mg by mouth 3 (three) times daily.     hydrALAZINE (APRESOLINE) 100 MG tablet Take 1 tablet (100 mg total) by mouth every 8 (eight) hours. (Patient not taking: Reported on 02/19/2021)     lidocaine (LIDODERM) 5 % Place 1 patch onto the skin daily. Remove & Discard patch within 12 hours or as directed by MD (Patient not taking: Reported on 02/19/2021) 30 patch 0   methocarbamol (ROBAXIN) 500 MG tablet Take 1 tablet (500 mg  total) by mouth every 6 (six) hours as needed for muscle spasms. (Patient not taking: Reported on 02/19/2021)     Multiple Vitamins-Minerals (PRESERVISION AREDS 2 PO) Take 1 capsule by mouth 2 (two) times daily.      omeprazole (PRILOSEC) 20 MG capsule Take 20 mg by mouth daily. (Patient not taking: Reported on 02/19/2021)     ondansetron (ZOFRAN) 4 MG tablet Take 1 tablet (4 mg total) by mouth every 6 (six) hours for 15 days. 60 tablet 0   senna-docusate (SENOKOT-S) 8.6-50 MG tablet Take 2 tablets by mouth 2 (two) times daily. (Patient not taking: Reported on 02/19/2021)     tamsulosin (FLOMAX) 0.4 MG CAPS capsule Take 1 capsule (0.4 mg total) by mouth daily. (Patient not taking: Reported on 02/19/2021) 30 capsule 3   traMADol (ULTRAM) 50 MG tablet Take 1 tablet (50 mg total) by mouth every 6 (six) hours. (Patient not taking: Reported on 02/19/2021) 120 tablet 0   Zinc 50 MG CAPS Take 50 mg by mouth daily.  (Patient not  taking: Reported on 02/19/2021)     No current facility-administered medications for this visit.    VITAL SIGNS: There were no vitals taken for this visit. There were no vitals filed for this visit.  Estimated body mass index is 25.95 kg/m as calculated from the following:   Height as of 01/30/21: '5\' 9"'  (1.753 m).   Weight as of an earlier encounter on 02/19/21: 175 lb 11.2 oz (79.7 kg).  LABS: CBC:    Component Value Date/Time   WBC 3.6 (L) 02/05/2021 0314   HGB 10.1 (L) 02/05/2021 0314   HGB 15.1 01/24/2013 0819   HCT 31.1 (L) 02/05/2021 0314   HCT 44.3 01/24/2013 0819   PLT 226 02/05/2021 0314   PLT 185 01/24/2013 0819   MCV 93.7 02/05/2021 0314   MCV 90 01/24/2013 0819   NEUTROABS 3.0 01/29/2021 1425   NEUTROABS 7.4 (H) 01/24/2013 0819   LYMPHSABS 0.8 01/29/2021 1425   LYMPHSABS 1.2 01/24/2013 0819   MONOABS 0.2 01/29/2021 1425   MONOABS 0.3 01/24/2013 0819   EOSABS 0.0 01/29/2021 1425   EOSABS 0.0 01/24/2013 0819   BASOSABS 0.0 01/29/2021 1425   BASOSABS 0.0  01/24/2013 0819   Comprehensive Metabolic Panel:    Component Value Date/Time   NA 139 02/06/2021 0244   NA 138 01/24/2013 0819   K 3.8 02/06/2021 0244   K 3.8 01/24/2013 0819   CL 106 02/06/2021 0244   CL 105 01/24/2013 0819   CO2 26 02/06/2021 0244   CO2 25 01/24/2013 0819   BUN 36 (H) 02/06/2021 0244   BUN 20 (H) 01/24/2013 0819   CREATININE 2.31 (H) 02/06/2021 0244   CREATININE 1.52 (H) 01/24/2013 0819   GLUCOSE 107 (H) 02/06/2021 0244   GLUCOSE 171 (H) 01/24/2013 0819   CALCIUM 9.7 02/06/2021 0244   CALCIUM 10.4 (H) 02/05/2021 1920   AST 17 01/29/2021 1425   AST 18 01/24/2013 0819   ALT 11 01/29/2021 1425   ALT 15 01/24/2013 0819   ALKPHOS 104 01/29/2021 1425   ALKPHOS 74 01/24/2013 0819   BILITOT 0.8 01/29/2021 1425   BILITOT 0.5 01/24/2013 0819   PROT 6.1 (L) 01/29/2021 1425   PROT 6.3 (L) 01/24/2013 0819   ALBUMIN 3.5 01/29/2021 1425   ALBUMIN 3.6 01/24/2013 0819    RADIOGRAPHIC STUDIES: CT ABDOMEN PELVIS WO CONTRAST  Result Date: 01/29/2021 CLINICAL DATA:  Acute generalized abdominal pain and distention. EXAM: CT ABDOMEN AND PELVIS WITHOUT CONTRAST TECHNIQUE: Multidetector CT imaging of the abdomen and pelvis was performed following the standard protocol without IV contrast. COMPARISON:  January 02, 2021. FINDINGS: Lower chest: Moderate size sliding-type hiatal hernia is noted. Visualized lung bases are unremarkable. Hepatobiliary: No focal liver abnormality is seen. No gallstones, gallbladder wall thickening, or biliary dilatation. Pancreas: Unremarkable. No pancreatic ductal dilatation or surrounding inflammatory changes. Spleen: Normal in size without focal abnormality. Adrenals/Urinary Tract: Adrenal glands are unremarkable. Kidneys are normal, without renal calculi, focal lesion, or hydronephrosis. Bladder is unremarkable. Stomach/Bowel: Stomach is within normal limits. Appendix appears normal. No evidence of bowel wall thickening, distention, or inflammatory  changes. Vascular/Lymphatic: Aortic atherosclerosis. No enlarged abdominal or pelvic lymph nodes. Reproductive: Stable moderate prostatic enlargement is noted. Other: No abdominal wall hernia or abnormality. No abdominopelvic ascites. Musculoskeletal: Stable subacute to old T12 compression fracture. IMPRESSION: Moderate size sliding-type hiatal hernia. Stable moderate prostatic enlargement. No acute abnormality seen in the abdomen or pelvis. Aortic Atherosclerosis (ICD10-I70.0). Electronically Signed   By: Marijo Conception M.D.   On:  01/29/2021 16:05   DG Ribs Bilateral  Result Date: 02/03/2021 CLINICAL DATA:  Pain. EXAM: BILATERAL RIBS - 3+ VIEW COMPARISON:  Chest x-ray 02/02/2021. FINDINGS: Nondisplaced left anterolateral fifth rib fracture cannot be completely excluded. Prior thoracolumbar vertebroplasties. Calcifications noted over the left lung most likely calcified granulomas. Bibasilar atelectasis. IMPRESSION: Nondisplaced left anterolateral fifth rib fracture cannot be completely excluded. No evidence of pneumothorax. Electronically Signed   By: Marcello Moores  Register   On: 02/03/2021 05:18   DG Abd 1 View  Result Date: 02/01/2021 CLINICAL DATA:  Nausea and vomiting. EXAM: ABDOMEN - 1 VIEW COMPARISON:  December 25, 2020 FINDINGS: Hernia mesh projects over the pelvis. Degenerative changes in the lumbar spine. No other bony abnormalities. No free air, portal venous gas, or pneumatosis. No bowel obstruction. IMPRESSION: No acute abnormalities identified. Electronically Signed   By: Dorise Bullion III M.D   On: 02/01/2021 10:25   CT Thoracic Spine Wo Contrast  Result Date: 01/29/2021 CLINICAL DATA:  Mid back pain; mid back pain, history of T12 fracture, evaluate for additional trauma. Additional provided: Patient reports back pain from old injury, nausea, symptoms persist in for 3 days. EXAM: CT THORACIC SPINE WITHOUT CONTRAST TECHNIQUE: Multidetector CT images of the thoracic were obtained using the standard  protocol without intravenous contrast. COMPARISON:  CT abdomen/pelvis 01/02/2021. FINDINGS: Alignment: No significant spondylolisthesis. Unchanged mild bony retropulsion at the level of the T12 superior endplate. Vertebrae: Mild interval progression of height loss at site of a previously demonstrated T12 compression fracture (now 60%). Vertebral body height is otherwise maintained within the thoracic spine. No evidence of fracture elsewhere within the thoracic spine. T9 vertebral body hemangioma. Multilevel ventrolateral osteophytes. Paraspinal and other soft tissues: Linear atelectasis within the imaged lung bases. Hiatal hernia. Aortoiliac atherosclerosis. Disc levels: Multilevel disc space narrowing. Most notably, there is moderate to moderately advanced disc space narrowing at the T7-T8 through T9-T12 levels. At T11-T12, there is mild bony retropulsion at the level of the T12 superior endplate. Associated disc bulge. Mild relative narrowing of the ventral spinal canal. Mild bilateral neural foraminal narrowing. No appreciable significant disc herniation or spinal canal stenosis at the remaining thoracic levels. No compressive foraminal narrowing. IMPRESSION: Mild interval progression of a previously demonstrated T12 compression fracture (now with 60% height loss). Unchanged mild bony retropulsion at the level of the T12 superior endplate. Associated disc bulge at T11-T12. Resultant mild relative narrowing of the spinal canal at this level. Mild bilateral T11-T12 neural foraminal narrowing. No other thoracic spine fracture is identified. No significant disc herniation, spinal canal stenosis or compressive neural foraminal narrowing at the remaining thoracic levels. Linear atelectasis within the imaged lung bases. Hiatal hernia. Aortic Atherosclerosis (ICD10-I70.0). Electronically Signed   By: Kellie Simmering DO   On: 01/29/2021 16:30   MR THORACIC SPINE WO CONTRAST  Result Date: 01/29/2021 CLINICAL DATA:  Mid  back pain with compression fracture. EXAM: MRI THORACIC AND LUMBAR SPINE WITHOUT CONTRAST TECHNIQUE: Multiplanar and multiecho pulse sequences of the thoracic and lumbar spine were obtained without intravenous contrast. COMPARISON:  CT abdomen pelvis 10/16/2017 CT chest 11/21/2017 CT thoracic and lumbar spine 01/29/2021 FINDINGS: MRI THORACIC SPINE FINDINGS Alignment:  Normal Vertebrae: Compression fracture of T12 with approximately 50% height loss and moderate edema. 5 mm of retropulsion. T9 hemangioma. High fluid sensitive signal within the T8 vertebral body near the inferior endplate, possibly a Schmorl's node. Cord:  Normal signal and morphology. Paraspinal and other soft tissues: Negative. Disc levels: No spinal canal stenosis. MRI LUMBAR  SPINE FINDINGS Segmentation:  Standard Alignment:  Normal Vertebrae: High fluid sensitive signal focus in the L4 vertebral body. L1 compression fracture with less than 25% height loss. Minimal retropulsion. Conus medullaris and cauda equina: Conus extends to the L2 level. Conus and cauda equina appear normal. Paraspinal and other soft tissues: Negative. Disc levels: L1-L2: Normal disc space and facet joints. No spinal canal stenosis. No neural foraminal stenosis. L2-L3: Normal disc space and facet joints. No spinal canal stenosis. No neural foraminal stenosis. L3-L4: Intermediate disc bulge. No spinal canal stenosis. No neural foraminal stenosis. L4-L5: Mild facet hypertrophy with mild left asymmetric disc bulge. No spinal canal stenosis. No neural foraminal stenosis. L5-S1: Normal disc space and facet joints. No spinal canal stenosis. No neural foraminal stenosis. Visualized sacrum: Normal. IMPRESSION: 1. Compression fractures of T12 and L1 with approximately 50% and less than 25% height loss, respectively. Mild retropulsion with no associated stenosis. 2. Indeterminate fluid-sensitive signal hyperintensities in the T8 and L4 vertebrae. I suspect these are Schmorl's nodes  based on comparison with prior CTs. 3. No thoracic or lumbar spinal canal or neural foraminal stenosis. Electronically Signed   By: Ulyses Jarred M.D.   On: 01/29/2021 21:34   MR LUMBAR SPINE WO CONTRAST  Result Date: 01/29/2021 CLINICAL DATA:  Mid back pain with compression fracture. EXAM: MRI THORACIC AND LUMBAR SPINE WITHOUT CONTRAST TECHNIQUE: Multiplanar and multiecho pulse sequences of the thoracic and lumbar spine were obtained without intravenous contrast. COMPARISON:  CT abdomen pelvis 10/16/2017 CT chest 11/21/2017 CT thoracic and lumbar spine 01/29/2021 FINDINGS: MRI THORACIC SPINE FINDINGS Alignment:  Normal Vertebrae: Compression fracture of T12 with approximately 50% height loss and moderate edema. 5 mm of retropulsion. T9 hemangioma. High fluid sensitive signal within the T8 vertebral body near the inferior endplate, possibly a Schmorl's node. Cord:  Normal signal and morphology. Paraspinal and other soft tissues: Negative. Disc levels: No spinal canal stenosis. MRI LUMBAR SPINE FINDINGS Segmentation:  Standard Alignment:  Normal Vertebrae: High fluid sensitive signal focus in the L4 vertebral body. L1 compression fracture with less than 25% height loss. Minimal retropulsion. Conus medullaris and cauda equina: Conus extends to the L2 level. Conus and cauda equina appear normal. Paraspinal and other soft tissues: Negative. Disc levels: L1-L2: Normal disc space and facet joints. No spinal canal stenosis. No neural foraminal stenosis. L2-L3: Normal disc space and facet joints. No spinal canal stenosis. No neural foraminal stenosis. L3-L4: Intermediate disc bulge. No spinal canal stenosis. No neural foraminal stenosis. L4-L5: Mild facet hypertrophy with mild left asymmetric disc bulge. No spinal canal stenosis. No neural foraminal stenosis. L5-S1: Normal disc space and facet joints. No spinal canal stenosis. No neural foraminal stenosis. Visualized sacrum: Normal. IMPRESSION: 1. Compression fractures  of T12 and L1 with approximately 50% and less than 25% height loss, respectively. Mild retropulsion with no associated stenosis. 2. Indeterminate fluid-sensitive signal hyperintensities in the T8 and L4 vertebrae. I suspect these are Schmorl's nodes based on comparison with prior CTs. 3. No thoracic or lumbar spinal canal or neural foraminal stenosis. Electronically Signed   By: Ulyses Jarred M.D.   On: 01/29/2021 21:34   CT L-SPINE NO CHARGE  Result Date: 01/29/2021 CLINICAL DATA:  Back pain. Additional provided: Patient reports back pain from an old injury. Nausea, symptoms persistent for 3 days. EXAM: CT LUMBAR SPINE WITHOUT CONTRAST TECHNIQUE: Multidetector CT imaging of the lumbar spine was performed without intravenous contrast administration. Multiplanar CT image reconstructions were also generated. COMPARISON:  CT abdomen/pelvis 01/02/2021. FINDINGS:  Segmentation: 5 lumbar vertebrae. The caudal most well-formed intervertebral disc space is designated L5-S1. Alignment: Mild lumbar dextrocurvature. Unchanged mild bony retropulsion at the level of the T12 superior endplate. Minimal bony retropulsion is present at the L1 superior endplate, at site of an interval compression fracture. Trace L4-L5 grade 1 anterolisthesis, unchanged. Vertebrae: New from the prior CT abdomen/pelvis of 01/02/2021, there is a mild L2-1 vertebral compression fracture (10-20% height loss). There has also been subtle progressive interval height loss at site of a previously demonstrated T12 vertebral compression fracture (now with 60% height loss anteriorly). Vertebral body height is otherwise maintained. No fracture is identified elsewhere within the lumbar spine. Redemonstrated partial fusion across the right greater than left sacroiliac joints. Paraspinal and other soft tissues: Aortoiliac atherosclerosis. Sigmoid diverticulosis. Paraspinal soft tissues within normal limits. Disc levels: No more than mild disc degeneration at any  level. T11-T12: Unchanged mild bony retropulsion at the level of the T12 superior endplate. Associated disc bulge. Mild relative narrowing of the ventral spinal canal. Mild left neural foraminal narrowing. T12-L1: Trace bony retropulsion at the level of the L1 superior endplate. Small disc bulge. No significant spinal canal stenosis. Mild/moderate bilateral neural foraminal narrowing. L1-L2: Disc bulge. No appreciable significant spinal canal stenosis or neural foraminal narrowing. L2-L3: Disc bulge. No appreciable significant spinal canal stenosis or neural foraminal narrowing. L3-L4: Disc bulge. Facet arthrosis/ligamentum flavum hypertrophy. Mild/moderate central canal stenosis with bilateral subarticular narrowing. Mild bilateral neural foraminal narrowing. L4-L5: Trace grade 1 anterolisthesis. Disc uncovering with disc bulge. Facet arthrosis/ligamentum flavum hypertrophy. Mild/moderate central canal stenosis with bilateral subarticular narrowing. Mild bilateral neural foraminal narrowing. L5-S1: No significant disc herniation or stenosis. IMPRESSION: Mild L1 vertebral compression deformity, new as compared to the CT abdomen/pelvis of 01/02/2021 and acute/subacute. Interval progression of height loss at site of a previously demonstrated T12 compression fracture (now with up to 60% height loss). Unchanged mild bony retropulsion at the level of the T12 superior endplate. Lumbar spondylosis, as outlined. Multilevel spinal canal stenosis. Most notably, there is multifactorial apparent mild/moderate central canal stenosis at L3-L4 and L4-L5 with bilateral subarticular narrowing. Sites of mild and mild/moderate neural foraminal narrowing, as detailed. Partial fusion of the sacroiliac joints, bilaterally. Aortic Atherosclerosis (ICD10-I70.0). Sigmoid diverticulosis. Electronically Signed   By: Kellie Simmering DO   On: 01/29/2021 16:21   DG Chest Port 1 View  Result Date: 02/02/2021 CLINICAL DATA:  Rib pain after  kyphoplasty today. EXAM: PORTABLE CHEST 1 VIEW COMPARISON:  Radiograph 01/10/2013, abdominal CT 01/29/2021 FINDINGS: Elevated right hemidiaphragm, with progression from prior exam. The heart is normal in size. Smooth prominence of the right mediastinum is likely due to aortic tortuosity. No pneumothorax. Minor bibasilar atelectasis. No confluent consolidation. No pleural effusion. Kyphoplasty not visualized on the current exam. No evidence of kyphoplasty cement in the thoracic vessels. No evidence of rib fracture. IMPRESSION: 1. Elevated right hemidiaphragm, progressed from prior exam. Minor bibasilar atelectasis. 2. Smooth prominence of the right mediastinal border is likely due to aortic tortuosity. The ascending aorta is dilated on included portion from recent abdominal CT consistent with ascending aortic aneurysm. Recommend nonemergent chest CT to assess the entire thoracic aorta. Electronically Signed   By: Keith Rake M.D.   On: 02/02/2021 20:09   IR KYPHO LUMBAR INC FX REDUCE BONE BX UNI/BIL CANNULATION INC/IMAGING  Result Date: 02/04/2021 INDICATION: Severe low back pain secondary to compression fractures at T12 and L1. EXAM: BALLOON KYPHOPLASTIES AT T12 AND L1 COMPARISON:  MRI the lumbosacral spine of Jan 29, 2021. MEDICATIONS: As antibiotic prophylaxis, none was ordered pre-procedure and administered intravenously within 1 hour of incision. ANESTHESIA/SEDATION: Moderate (conscious) sedation was employed during this procedure. A total of Versed 2 mg and Fentanyl 62.5 mcg was administered intravenously. Moderate Sedation Time: 38 minutes. The patient's level of consciousness and vital signs were monitored continuously by radiology nursing throughout the procedure under my direct supervision. FLUOROSCOPY TIME:  Fluoroscopy Time: 17 minutes 54 seconds (208.0EMV) COMPLICATIONS: None immediate. PROCEDURE: Following a full explanation of the procedure along with the potential associated complications, an  informed witnessed consent was obtained. The patient was placed prone on the fluoroscopic table. The skin overlying the thoracolumbar region was then prepped and draped in the usual sterile fashion. The right pedicle at T12, and left pedicle at L1 were then infiltrated with 0.25% bupivacaine followed by the advancement of an 11-gauge Jamshidi needle through the right pedicle at T12 and the left pedicle at L1 into the posterior one-third at the two levels. These were then exchanged for a Kyphon advanced osteo introducer system comprised of a working cannula and a Kyphon osteo drill. This combinations were then advanced over a Kyphon osteo bone pin until the tips of the Kyphon osteo drills were in the posterior third at the two levels. At this time, the bone pins were removed. In a medial trajectory, the combinations were was advanced until the tips of the working cannulae were inside the posterior one-third at the two levels. The osteo drills were removed. Through the working cannula, a Kyphon inflatable bone tamp 20 x 3 were advanced and positioned with the distal marker 5 mm from the anterior aspect of T12 and L1. Crossing of the midline was seen on the AP projection. At this time, the balloons were expanded using contrast via a Kyphon inflation syringe device via microtubing. Inflations were continued until there was apposition with the superior and the inferior endplates. At this time, methylmethacrylate mixture was reconstituted with Tobramycin in the Kyphon bone mixing device system. This was then loaded onto the Kyphon bone fillers. The balloons were deflated and removed followed by the instillation of 6 bone filler equivalents of methylmethacrylate mixture at L1, and 3 bone filler equivalents at T12 with excellent filling in the AP and lateral projections. No extravasation was noted in the disk spaces or posteriorly into the spinal canal. No epidural venous contamination was seen. The working cannulae and the  bone fillers were then retrieved and removed. IMPRESSION: 1. Status post vertebral body augmentation using balloon kyphoplasty at T12 and L1 as described without event. Electronically Signed   By: Luanne Bras M.D.   On: 02/02/2021 17:27   IR KYPHO EA ADDL LEVEL THORACIC OR LUMBAR  Result Date: 02/04/2021 INDICATION: Severe low back pain secondary to compression fractures at T12 and L1. EXAM: BALLOON KYPHOPLASTIES AT T12 AND L1 COMPARISON:  MRI the lumbosacral spine of Jan 29, 2021. MEDICATIONS: As antibiotic prophylaxis, none was ordered pre-procedure and administered intravenously within 1 hour of incision. ANESTHESIA/SEDATION: Moderate (conscious) sedation was employed during this procedure. A total of Versed 2 mg and Fentanyl 62.5 mcg was administered intravenously. Moderate Sedation Time: 38 minutes. The patient's level of consciousness and vital signs were monitored continuously by radiology nursing throughout the procedure under my direct supervision. FLUOROSCOPY TIME:  Fluoroscopy Time: 17 minutes 54 seconds (361.2AES) COMPLICATIONS: None immediate. PROCEDURE: Following a full explanation of the procedure along with the potential associated complications, an informed witnessed consent was obtained. The patient was placed prone  on the fluoroscopic table. The skin overlying the thoracolumbar region was then prepped and draped in the usual sterile fashion. The right pedicle at T12, and left pedicle at L1 were then infiltrated with 0.25% bupivacaine followed by the advancement of an 11-gauge Jamshidi needle through the right pedicle at T12 and the left pedicle at L1 into the posterior one-third at the two levels. These were then exchanged for a Kyphon advanced osteo introducer system comprised of a working cannula and a Kyphon osteo drill. This combinations were then advanced over a Kyphon osteo bone pin until the tips of the Kyphon osteo drills were in the posterior third at the two levels. At this  time, the bone pins were removed. In a medial trajectory, the combinations were was advanced until the tips of the working cannulae were inside the posterior one-third at the two levels. The osteo drills were removed. Through the working cannula, a Kyphon inflatable bone tamp 20 x 3 were advanced and positioned with the distal marker 5 mm from the anterior aspect of T12 and L1. Crossing of the midline was seen on the AP projection. At this time, the balloons were expanded using contrast via a Kyphon inflation syringe device via microtubing. Inflations were continued until there was apposition with the superior and the inferior endplates. At this time, methylmethacrylate mixture was reconstituted with Tobramycin in the Kyphon bone mixing device system. This was then loaded onto the Kyphon bone fillers. The balloons were deflated and removed followed by the instillation of 6 bone filler equivalents of methylmethacrylate mixture at L1, and 3 bone filler equivalents at T12 with excellent filling in the AP and lateral projections. No extravasation was noted in the disk spaces or posteriorly into the spinal canal. No epidural venous contamination was seen. The working cannulae and the bone fillers were then retrieved and removed. IMPRESSION: 1. Status post vertebral body augmentation using balloon kyphoplasty at T12 and L1 as described without event. Electronically Signed   By: Luanne Bras M.D.   On: 02/02/2021 17:27    PERFORMANCE STATUS (ECOG) : 2 - Symptomatic, <50% confined to bed  Review of Systems Unless otherwise noted, a complete review of systems is negative.  Physical Exam General: NAD Pulmonary: Unlabored Extremities: no edema, no joint deformities MS: back brace Skin: no rashes Neurological: Weakness but otherwise nonfocal  IMPRESSION: I met with patient and wife today in the clinic following their visit with Dr. Grayland Ormond.  Patient return home from rehab this week.  He remains weak and  is relying on his wife for most of his daily care.  He is ambulating with use of a walker but is mostly sitting in chair during the day due to weakness and pain.  Rest patient is sleeping in the chair at night as he is unable to lie flat due to pain.   Given weakness, will consult home health PT/OT.  Patient has used [facility] in the past.  Patient has been taking a regimen of tramadol 50 mg scheduled every 6 hours.  Additionally, he has been taking acetaminophen 650 mg 4 times daily.  He says that his pain is currently averaging 3 out of 10 and he feels that it is reasonably well controlled.  Tramadol was recently refilled by Dr. Hilma Favors.   Patient does endorse constipation and we discussed adjustment of his bowel regimen today.  He is currently only taking Colace and I suggested that he add senna and instructed him on titration parameters to target a bowel  regimen daily or every other day.  Patient has ACP documents at home.  May be helpful for him to bring notes in to be scanned into the chart.  Patient would also benefit from discussion about MOST form.  Note that at baseline, patient was extremely robust physically prior to his most recent hospitalizations.  It is his hope that he will clinically improve and be able to return to his previous baseline.  Plan is for myeloma work-up per Dr. Grayland Ormond.  PLAN: -Continue current prescription treatment -Continue tramadol/acetaminophen every 6 hours -Daily bowel regimen with senna -Patient to bring in ACP documents -Referral to home health PT/OT/CNA -RTC 2-3 weeks   Patient expressed understanding and was in agreement with this plan. He also understands that He can call the clinic at any time with any questions, concerns, or complaints.     Time Total: 30 minutes  Visit consisted of counseling and education dealing with the complex and emotionally intense issues of symptom management and palliative care in the setting of serious and potentially  life-threatening illness.Greater than 50%  of this time was spent counseling and coordinating care related to the above assessment and plan.  Signed by: Altha Harm, PhD, NP-C

## 2021-02-20 ENCOUNTER — Ambulatory Visit: Admission: RE | Admit: 2021-02-20 | Payer: PPO | Source: Ambulatory Visit

## 2021-02-20 LAB — PTH, INTACT AND CALCIUM
Calcium, Total (PTH): 10.4 mg/dL — ABNORMAL HIGH (ref 8.6–10.2)
PTH: 22 pg/mL (ref 15–65)

## 2021-02-20 LAB — KAPPA/LAMBDA LIGHT CHAINS
Kappa free light chain: 5400.9 mg/L — ABNORMAL HIGH (ref 3.3–19.4)
Kappa, lambda light chain ratio: 1256.02 — ABNORMAL HIGH (ref 0.26–1.65)
Lambda free light chains: 4.3 mg/L — ABNORMAL LOW (ref 5.7–26.3)

## 2021-02-20 LAB — PROTEIN ELECTROPHORESIS, SERUM
A/G Ratio: 1.4 (ref 0.7–1.7)
Albumin ELP: 3.8 g/dL (ref 2.9–4.4)
Alpha-1-Globulin: 0.3 g/dL (ref 0.0–0.4)
Alpha-2-Globulin: 1.1 g/dL — ABNORMAL HIGH (ref 0.4–1.0)
Beta Globulin: 1 g/dL (ref 0.7–1.3)
Gamma Globulin: 0.4 g/dL (ref 0.4–1.8)
Globulin, Total: 2.8 g/dL (ref 2.2–3.9)
M-Spike, %: 0.1 g/dL — ABNORMAL HIGH
Total Protein ELP: 6.6 g/dL (ref 6.0–8.5)

## 2021-02-20 LAB — IGG, IGA, IGM
IgA: 18 mg/dL — ABNORMAL LOW (ref 61–437)
IgG (Immunoglobin G), Serum: 415 mg/dL — ABNORMAL LOW (ref 603–1613)
IgM (Immunoglobulin M), Srm: 22 mg/dL (ref 15–143)

## 2021-02-20 LAB — BETA 2 MICROGLOBULIN, SERUM: Beta-2 Microglobulin: 6.7 mg/L — ABNORMAL HIGH (ref 0.6–2.4)

## 2021-02-20 NOTE — Progress Notes (Signed)
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  INTERVAL HISTORY: Patient is an 87 year old male who was previously in good health who developed increasing back pain and was noted to have compression fractures requiring kyphoplasty.  He continues to have significant pain which limits his mobility, but otherwise feels well.  He has no neurologic complaints.  He denies any recent fevers.  He has a fair appetite, but denies weight loss.  He has no chest pain, shortness of breath, cough, or hemoptysis.  He denies any nausea, vomiting, constipation, or diarrhea.  He has no urinary complaints.  Patient offers no further specific complaints today.  REVIEW OF SYSTEMS:   Review of Systems  Constitutional:  Positive for malaise/fatigue. Negative for fever.  Respiratory: Negative.  Negative for cough, hemoptysis and shortness of breath.   Cardiovascular: Negative.  Negative for chest pain and leg swelling.  Gastrointestinal: Negative.  Negative for abdominal pain.  Genitourinary: Negative.  Negative for dysuria.  Musculoskeletal:  Positive for back pain.  Skin: Negative.  Negative for rash.  Neurological:  Positive for weakness. Negative for dizziness, focal weakness and headaches.  Psychiatric/Behavioral: Negative.  The patient is not nervous/anxious.    As per HPI. Otherwise, a complete review of systems is negative.  PAST MEDICAL HISTORY: Past Medical History:  Diagnosis Date   Elevated prostate specific antigen (PSA)    has been 7 for a year    GERD (gastroesophageal reflux disease)    History of colon polyps 2008   [facility],    History of kidney stones    Hyperlipidemia    Hypertension    Prostate hypertrophy    diagnosed at age 65 due to hematospermia   Renal disorder     PAST SURGICAL HISTORY: Past Surgical History:  Procedure Laterality Date   CATARACT EXTRACTION W/PHACO Left 01/10/2018   Procedure: CATARACT EXTRACTION PHACO AND INTRAOCULAR LENS PLACEMENT ([place]);  Surgeon: Birder Robson, MD;  Location: [facility] ORS;  Service: Ophthalmology;  Laterality: Left;  [name] 00:24.8 AP% 14.9 CDE 3.68 Fluid pack lot # [id number]   CATARACT EXTRACTION W/PHACO Right 01/25/2018   Procedure: CATARACT EXTRACTION PHACO AND INTRAOCULAR LENS PLACEMENT ([facility]);  Surgeon: Birder Robson, MD;  Location: [facility] ORS;  Service: Ophthalmology;  Laterality: Right;  [name] 00:42 AP% 10.8 CDE 4.59 Fluid pack lot # [id number]   COLON SURGERY     CYSTOSCOPY W/ URETERAL STENT PLACEMENT Right 10/16/2017   Procedure: right  URETERAL STENT PLACEMENT,cystoscopy bilateral stent removal,rretrograde;  Surgeon: Abbie Sons, MD;  Location: [facility] ORS;  Service: Urology;  Laterality: Right;   CYSTOSCOPY/URETEROSCOPY/HOLMIUM LASER/STENT PLACEMENT Right 12/16/2020   Procedure: CYSTOSCOPY/URETEROSCOPY/HOLMIUM LASER/STENT PLACEMENT;  Surgeon: Abbie Sons, MD;  Location: [facility] ORS;  Service: Urology;  Laterality: Right;   EXTRACORPOREAL SHOCK WAVE LITHOTRIPSY Right 12/11/2020   Procedure: EXTRACORPOREAL SHOCK WAVE LITHOTRIPSY ([facility]WL);  Surgeon: Abbie Sons, MD;  Location: [facility] ORS;  Service: Urology;  Laterality: Right;   IR KYPHO EA ADDL LEVEL THORACIC OR LUMBAR  02/02/2021   IR KYPHO LUMBAR INC FX REDUCE BONE BX UNI/BIL CANNULATION INC/IMAGING  02/02/2021   KIDNEY STONE SURGERY     RESECTION SOFT TISSUE TUMOR LEG / ANKLE RADICAL  jan 2009   [facility],  right thigh/knee , nonmalignant   SMALL INTESTINE SURGERY  1946   implaed on picket fence, punctured stomach   TONSILLECTOMY      FAMILY HISTORY: Family History  Problem Relation Age of Onset    Hypertension Father    Hyperlipidemia Father    Cancer Sister        thyroid - dx in late 20's    ADVANCED DIRECTIVES (Y/N):  N  HEALTH MAINTENANCE: Social History   Tobacco Use   Smoking status: Former    Pack years: 0.00    Types: Cigarettes    Quit date: 07/05/1965    Years since quitting: 55.6   Smokeless tobacco: Never  Vaping Use   Vaping Use: Never used  Substance Use Topics   Alcohol use: Yes    Alcohol/week: 1.0 standard drink    Types: 1 Standard drinks or equivalent per week    Comment: occassionaly   Drug use: No     Colonoscopy:  PAP:  Bone density:  Lipid panel:  Allergies  Allergen Reactions   Amlodipine Other (See Comments)    LE edema   Azithromycin Nausea And Vomiting   Lisinopril Cough    Current Outpatient Medications  Medication Sig Dispense Refill   acetaminophen (TYLENOL) 325 MG tablet Take 2 tablets (650 mg total) by mouth every 6 (six) hours as needed for fever, headache or moderate pain.     apixaban (ELIQUIS) 2.5 MG TABS tablet Take 1 tablet (2.5 mg total) by mouth 2 (two) times daily. 60 tablet    atorvastatin (LIPITOR) 40 MG tablet Take 1 tablet (40 mg total) by mouth daily at 6 PM. 30 tablet 0   calcitonin, salmon, (MIACALCIN/FORTICAL) 200 UNIT/ACT nasal spray Place 1 spray into alternate nostrils daily. 3.7 mL 12   carvedilol (COREG) 6.25 MG tablet Take 1 tablet (6.25 mg total) by mouth 2 (two) times daily with a meal.     docusate sodium (COLACE) 100 MG capsule Take 1 capsule (100 mg total) by mouth 2 (two) times daily. 60 capsule 0   gabapentin (NEURONTIN) 100 MG capsule Take 100 mg by mouth 3 (three) times daily.     Multiple Vitamins-Minerals (PRESERVISION AREDS 2 PO) Take 1 capsule by mouth 2 (two) times daily.      ondansetron (ZOFRAN) 4 MG tablet Take 1 tablet (4 mg total) by mouth every 6 (six) hours for 15 days. 60 tablet 0   amiodarone (PACERONE) 200 MG tablet Take 1 tablet (200 mg total) by mouth daily for 20 days. (Patient  not taking: Reported on 02/19/2021) 20 tablet 0   Ascorbic Acid (VITAMIN C) 1000 MG tablet Take 1,000 mg by mouth daily. (Patient not taking: Reported on 02/19/2021)     cholecalciferol (VITAMIN D) 1000 UNITS tablet Take 1,000 Units by mouth daily. (Patient not taking: Reported on 02/19/2021)     Cyanocobalamin (VITAMIN B-12 PO) Take 1 tablet by mouth daily. (Patient not taking: Reported on 02/19/2021)     hydrALAZINE (APRESOLINE) 100 MG tablet Take 1 tablet (100 mg total) by mouth every 8 (eight) hours. (Patient not taking: Reported on 02/19/2021)     lidocaine (LIDODERM) 5 % Place 1 patch onto the skin daily. Remove & Discard patch within 12 hours or as directed by MD (Patient not taking: Reported on 02/19/2021) 30 patch 0   methocarbamol (ROBAXIN) 500 MG tablet Take 1 tablet (500 mg total) by mouth every 6 (six) hours as needed for muscle spasms. (  Patient not taking: Reported on 02/19/2021)     omeprazole (PRILOSEC) 20 MG capsule Take 20 mg by mouth daily. (Patient not taking: Reported on 02/19/2021)     senna-docusate (SENOKOT-S) 8.6-50 MG tablet Take 2 tablets by mouth 2 (two) times daily. (Patient not taking: Reported on 02/19/2021)     tamsulosin (FLOMAX) 0.4 MG CAPS capsule Take 1 capsule (0.4 mg total) by mouth daily. (Patient not taking: Reported on 02/19/2021) 30 capsule 3   traMADol (ULTRAM) 50 MG tablet Take 1 tablet (50 mg total) by mouth every 6 (six) hours. (Patient not taking: Reported on 02/19/2021) 120 tablet 0   Zinc 50 MG CAPS Take 50 mg by mouth daily.  (Patient not taking: Reported on 02/19/2021)     No current facility-administered medications for this visit.    OBJECTIVE: Vitals:   02/19/21 1124  BP: (!) 127/96  Pulse: 76  Resp: (!) 76  Temp: (!) 96.2 F (35.7 C)  SpO2: 94%     Body mass index is 25.95 kg/m.    ECOG FS:1 - Symptomatic but completely ambulatory  General: Well-developed, well-nourished, no acute distress. Eyes: Pink conjunctiva, anicteric sclera. HEENT: Normocephalic,  moist mucous membranes. Lungs: No audible wheezing or coughing. Heart: Regular rate and rhythm. Abdomen: Soft, nontender, no obvious distention. Musculoskeletal: No edema, cyanosis, or clubbing. Neuro: Alert, answering all questions appropriately. Cranial nerves grossly intact. Skin: No rashes or petechiae noted. Psych: Normal affect. Lymphatics: No cervical, calvicular, axillary or inguinal LAD.   LAB RESULTS:  Lab Results  Component Value Date   NA 134 (L) 02/19/2021   K 4.2 02/19/2021   CL 96 (L) 02/19/2021   CO2 25 02/19/2021   GLUCOSE 90 02/19/2021   BUN 23 02/19/2021   CREATININE 1.90 (H) 02/19/2021   CALCIUM 10.4 (H) 02/19/2021   CALCIUM 10.0 02/19/2021   PROT 6.1 (L) 01/29/2021   ALBUMIN 3.5 01/29/2021   AST 17 01/29/2021   ALT 11 01/29/2021   ALKPHOS 104 01/29/2021   BILITOT 0.8 01/29/2021   GFRNONAA 34 (L) 02/19/2021   GFRAA 29 (L) 10/16/2017    Lab Results  Component Value Date   WBC 3.6 (L) 02/19/2021   NEUTROABS 3.0 01/29/2021   HGB 11.2 (L) 02/19/2021   HCT 34.2 (L) 02/19/2021   MCV 93.4 02/19/2021   PLT 292 02/19/2021     STUDIES: CT ABDOMEN PELVIS WO CONTRAST  Result Date: 01/29/2021 CLINICAL DATA:  Acute generalized abdominal pain and distention. EXAM: CT ABDOMEN AND PELVIS WITHOUT CONTRAST TECHNIQUE: Multidetector CT imaging of the abdomen and pelvis was performed following the standard protocol without IV contrast. COMPARISON:  January 02, 2021. FINDINGS: Lower chest: Moderate size sliding-type hiatal hernia is noted. Visualized lung bases are unremarkable. Hepatobiliary: No focal liver abnormality is seen. No gallstones, gallbladder wall thickening, or biliary dilatation. Pancreas: Unremarkable. No pancreatic ductal dilatation or surrounding inflammatory changes. Spleen: Normal in size without focal abnormality. Adrenals/Urinary Tract: Adrenal glands are unremarkable. Kidneys are normal, without renal calculi, focal lesion, or hydronephrosis. Bladder  is unremarkable. Stomach/Bowel: Stomach is within normal limits. Appendix appears normal. No evidence of bowel wall thickening, distention, or inflammatory changes. Vascular/Lymphatic: Aortic atherosclerosis. No enlarged abdominal or pelvic lymph nodes. Reproductive: Stable moderate prostatic enlargement is noted. Other: No abdominal wall hernia or abnormality. No abdominopelvic ascites. Musculoskeletal: Stable subacute to old T12 compression fracture. IMPRESSION: Moderate size sliding-type hiatal hernia. Stable moderate prostatic enlargement. No acute abnormality seen in the abdomen or pelvis. Aortic Atherosclerosis (ICD10-I70.0). Electronically Signed  By: Marijo Conception M.D.   On: 01/29/2021 16:05   DG Ribs Bilateral  Result Date: 02/03/2021 CLINICAL DATA:  Pain. EXAM: BILATERAL RIBS - 3+ VIEW COMPARISON:  Chest x-ray 02/02/2021. FINDINGS: Nondisplaced left anterolateral fifth rib fracture cannot be completely excluded. Prior thoracolumbar vertebroplasties. Calcifications noted over the left lung most likely calcified granulomas. Bibasilar atelectasis. IMPRESSION: Nondisplaced left anterolateral fifth rib fracture cannot be completely excluded. No evidence of pneumothorax. Electronically Signed   By: Marcello Moores  Register   On: 02/03/2021 05:18   DG Abd 1 View  Result Date: 02/01/2021 CLINICAL DATA:  Nausea and vomiting. EXAM: ABDOMEN - 1 VIEW COMPARISON:  December 25, 2020 FINDINGS: Hernia mesh projects over the pelvis. Degenerative changes in the lumbar spine. No other bony abnormalities. No free air, portal venous gas, or pneumatosis. No bowel obstruction. IMPRESSION: No acute abnormalities identified. Electronically Signed   By: Dorise Bullion III M.D   On: 02/01/2021 10:25   CT Thoracic Spine Wo Contrast  Result Date: 01/29/2021 CLINICAL DATA:  Mid back pain; mid back pain, history of T12 fracture, evaluate for additional trauma. Additional provided: Patient reports back pain from old injury, nausea,  symptoms persist in for 3 days. EXAM: CT THORACIC SPINE WITHOUT CONTRAST TECHNIQUE: Multidetector CT images of the thoracic were obtained using the standard protocol without intravenous contrast. COMPARISON:  CT abdomen/pelvis 01/02/2021. FINDINGS: Alignment: No significant spondylolisthesis. Unchanged mild bony retropulsion at the level of the T12 superior endplate. Vertebrae: Mild interval progression of height loss at site of a previously demonstrated T12 compression fracture (now 60%). Vertebral body height is otherwise maintained within the thoracic spine. No evidence of fracture elsewhere within the thoracic spine. T9 vertebral body hemangioma. Multilevel ventrolateral osteophytes. Paraspinal and other soft tissues: Linear atelectasis within the imaged lung bases. Hiatal hernia. Aortoiliac atherosclerosis. Disc levels: Multilevel disc space narrowing. Most notably, there is moderate to moderately advanced disc space narrowing at the T7-T8 through T9-T12 levels. At T11-T12, there is mild bony retropulsion at the level of the T12 superior endplate. Associated disc bulge. Mild relative narrowing of the ventral spinal canal. Mild bilateral neural foraminal narrowing. No appreciable significant disc herniation or spinal canal stenosis at the remaining thoracic levels. No compressive foraminal narrowing. IMPRESSION: Mild interval progression of a previously demonstrated T12 compression fracture (now with 60% height loss). Unchanged mild bony retropulsion at the level of the T12 superior endplate. Associated disc bulge at T11-T12. Resultant mild relative narrowing of the spinal canal at this level. Mild bilateral T11-T12 neural foraminal narrowing. No other thoracic spine fracture is identified. No significant disc herniation, spinal canal stenosis or compressive neural foraminal narrowing at the remaining thoracic levels. Linear atelectasis within the imaged lung bases. Hiatal hernia. Aortic Atherosclerosis  (ICD10-I70.0). Electronically Signed   By: Kellie Simmering DO   On: 01/29/2021 16:30   MR THORACIC SPINE WO CONTRAST  Result Date: 01/29/2021 CLINICAL DATA:  Mid back pain with compression fracture. EXAM: MRI THORACIC AND LUMBAR SPINE WITHOUT CONTRAST TECHNIQUE: Multiplanar and multiecho pulse sequences of the thoracic and lumbar spine were obtained without intravenous contrast. COMPARISON:  CT abdomen pelvis 10/16/2017 CT chest 11/21/2017 CT thoracic and lumbar spine 01/29/2021 FINDINGS: MRI THORACIC SPINE FINDINGS Alignment:  Normal Vertebrae: Compression fracture of T12 with approximately 50% height loss and moderate edema. 5 mm of retropulsion. T9 hemangioma. High fluid sensitive signal within the T8 vertebral body near the inferior endplate, possibly a Schmorl's node. Cord:  Normal signal and morphology. Paraspinal and other soft tissues:  Negative. Disc levels: No spinal canal stenosis. MRI LUMBAR SPINE FINDINGS Segmentation:  Standard Alignment:  Normal Vertebrae: High fluid sensitive signal focus in the L4 vertebral body. L1 compression fracture with less than 25% height loss. Minimal retropulsion. Conus medullaris and cauda equina: Conus extends to the L2 level. Conus and cauda equina appear normal. Paraspinal and other soft tissues: Negative. Disc levels: L1-L2: Normal disc space and facet joints. No spinal canal stenosis. No neural foraminal stenosis. L2-L3: Normal disc space and facet joints. No spinal canal stenosis. No neural foraminal stenosis. L3-L4: Intermediate disc bulge. No spinal canal stenosis. No neural foraminal stenosis. L4-L5: Mild facet hypertrophy with mild left asymmetric disc bulge. No spinal canal stenosis. No neural foraminal stenosis. L5-S1: Normal disc space and facet joints. No spinal canal stenosis. No neural foraminal stenosis. Visualized sacrum: Normal. IMPRESSION: 1. Compression fractures of T12 and L1 with approximately 50% and less than 25% height loss, respectively. Mild  retropulsion with no associated stenosis. 2. Indeterminate fluid-sensitive signal hyperintensities in the T8 and L4 vertebrae. I suspect these are Schmorl's nodes based on comparison with prior CTs. 3. No thoracic or lumbar spinal canal or neural foraminal stenosis. Electronically Signed   By: Ulyses Jarred M.D.   On: 01/29/2021 21:34   MR LUMBAR SPINE WO CONTRAST  Result Date: 01/29/2021 CLINICAL DATA:  Mid back pain with compression fracture. EXAM: MRI THORACIC AND LUMBAR SPINE WITHOUT CONTRAST TECHNIQUE: Multiplanar and multiecho pulse sequences of the thoracic and lumbar spine were obtained without intravenous contrast. COMPARISON:  CT abdomen pelvis 10/16/2017 CT chest 11/21/2017 CT thoracic and lumbar spine 01/29/2021 FINDINGS: MRI THORACIC SPINE FINDINGS Alignment:  Normal Vertebrae: Compression fracture of T12 with approximately 50% height loss and moderate edema. 5 mm of retropulsion. T9 hemangioma. High fluid sensitive signal within the T8 vertebral body near the inferior endplate, possibly a Schmorl's node. Cord:  Normal signal and morphology. Paraspinal and other soft tissues: Negative. Disc levels: No spinal canal stenosis. MRI LUMBAR SPINE FINDINGS Segmentation:  Standard Alignment:  Normal Vertebrae: High fluid sensitive signal focus in the L4 vertebral body. L1 compression fracture with less than 25% height loss. Minimal retropulsion. Conus medullaris and cauda equina: Conus extends to the L2 level. Conus and cauda equina appear normal. Paraspinal and other soft tissues: Negative. Disc levels: L1-L2: Normal disc space and facet joints. No spinal canal stenosis. No neural foraminal stenosis. L2-L3: Normal disc space and facet joints. No spinal canal stenosis. No neural foraminal stenosis. L3-L4: Intermediate disc bulge. No spinal canal stenosis. No neural foraminal stenosis. L4-L5: Mild facet hypertrophy with mild left asymmetric disc bulge. No spinal canal stenosis. No neural foraminal stenosis.  L5-S1: Normal disc space and facet joints. No spinal canal stenosis. No neural foraminal stenosis. Visualized sacrum: Normal. IMPRESSION: 1. Compression fractures of T12 and L1 with approximately 50% and less than 25% height loss, respectively. Mild retropulsion with no associated stenosis. 2. Indeterminate fluid-sensitive signal hyperintensities in the T8 and L4 vertebrae. I suspect these are Schmorl's nodes based on comparison with prior CTs. 3. No thoracic or lumbar spinal canal or neural foraminal stenosis. Electronically Signed   By: Ulyses Jarred M.D.   On: 01/29/2021 21:34   CT L-SPINE NO CHARGE  Result Date: 01/29/2021 CLINICAL DATA:  Back pain. Additional provided: Patient reports back pain from an old injury. Nausea, symptoms persistent for 3 days. EXAM: CT LUMBAR SPINE WITHOUT CONTRAST TECHNIQUE: Multidetector CT imaging of the lumbar spine was performed without intravenous contrast administration. Multiplanar CT image reconstructions  were also generated. COMPARISON:  CT abdomen/pelvis 01/02/2021. FINDINGS: Segmentation: 5 lumbar vertebrae. The caudal most well-formed intervertebral disc space is designated L5-S1. Alignment: Mild lumbar dextrocurvature. Unchanged mild bony retropulsion at the level of the T12 superior endplate. Minimal bony retropulsion is present at the L1 superior endplate, at site of an interval compression fracture. Trace L4-L5 grade 1 anterolisthesis, unchanged. Vertebrae: New from the prior CT abdomen/pelvis of 01/02/2021, there is a mild L2-1 vertebral compression fracture (10-20% height loss). There has also been subtle progressive interval height loss at site of a previously demonstrated T12 vertebral compression fracture (now with 60% height loss anteriorly). Vertebral body height is otherwise maintained. No fracture is identified elsewhere within the lumbar spine. Redemonstrated partial fusion across the right greater than left sacroiliac joints. Paraspinal and other soft  tissues: Aortoiliac atherosclerosis. Sigmoid diverticulosis. Paraspinal soft tissues within normal limits. Disc levels: No more than mild disc degeneration at any level. T11-T12: Unchanged mild bony retropulsion at the level of the T12 superior endplate. Associated disc bulge. Mild relative narrowing of the ventral spinal canal. Mild left neural foraminal narrowing. T12-L1: Trace bony retropulsion at the level of the L1 superior endplate. Small disc bulge. No significant spinal canal stenosis. Mild/moderate bilateral neural foraminal narrowing. L1-L2: Disc bulge. No appreciable significant spinal canal stenosis or neural foraminal narrowing. L2-L3: Disc bulge. No appreciable significant spinal canal stenosis or neural foraminal narrowing. L3-L4: Disc bulge. Facet arthrosis/ligamentum flavum hypertrophy. Mild/moderate central canal stenosis with bilateral subarticular narrowing. Mild bilateral neural foraminal narrowing. L4-L5: Trace grade 1 anterolisthesis. Disc uncovering with disc bulge. Facet arthrosis/ligamentum flavum hypertrophy. Mild/moderate central canal stenosis with bilateral subarticular narrowing. Mild bilateral neural foraminal narrowing. L5-S1: No significant disc herniation or stenosis. IMPRESSION: Mild L1 vertebral compression deformity, new as compared to the CT abdomen/pelvis of 01/02/2021 and acute/subacute. Interval progression of height loss at site of a previously demonstrated T12 compression fracture (now with up to 60% height loss). Unchanged mild bony retropulsion at the level of the T12 superior endplate. Lumbar spondylosis, as outlined. Multilevel spinal canal stenosis. Most notably, there is multifactorial apparent mild/moderate central canal stenosis at L3-L4 and L4-L5 with bilateral subarticular narrowing. Sites of mild and mild/moderate neural foraminal narrowing, as detailed. Partial fusion of the sacroiliac joints, bilaterally. Aortic Atherosclerosis (ICD10-I70.0). Sigmoid  diverticulosis. Electronically Signed   By: Kellie Simmering DO   On: 01/29/2021 16:21   DG Chest Port 1 View  Result Date: 02/02/2021 CLINICAL DATA:  Rib pain after kyphoplasty today. EXAM: PORTABLE CHEST 1 VIEW COMPARISON:  Radiograph 01/10/2013, abdominal CT 01/29/2021 FINDINGS: Elevated right hemidiaphragm, with progression from prior exam. The heart is normal in size. Smooth prominence of the right mediastinum is likely due to aortic tortuosity. No pneumothorax. Minor bibasilar atelectasis. No confluent consolidation. No pleural effusion. Kyphoplasty not visualized on the current exam. No evidence of kyphoplasty cement in the thoracic vessels. No evidence of rib fracture. IMPRESSION: 1. Elevated right hemidiaphragm, progressed from prior exam. Minor bibasilar atelectasis. 2. Smooth prominence of the right mediastinal border is likely due to aortic tortuosity. The ascending aorta is dilated on included portion from recent abdominal CT consistent with ascending aortic aneurysm. Recommend nonemergent chest CT to assess the entire thoracic aorta. Electronically Signed   By: Keith Rake M.D.   On: 02/02/2021 20:09   IR KYPHO LUMBAR INC FX REDUCE BONE BX UNI/BIL CANNULATION INC/IMAGING  Result Date: 02/04/2021 INDICATION: Severe low back pain secondary to compression fractures at T12 and L1. EXAM: BALLOON KYPHOPLASTIES AT T12 AND  L1 COMPARISON:  MRI the lumbosacral spine of Jan 29, 2021. MEDICATIONS: As antibiotic prophylaxis, none was ordered pre-procedure and administered intravenously within 1 hour of incision. ANESTHESIA/SEDATION: Moderate (conscious) sedation was employed during this procedure. A total of Versed 2 mg and Fentanyl 62.5 mcg was administered intravenously. Moderate Sedation Time: 38 minutes. The patient's level of consciousness and vital signs were monitored continuously by radiology nursing throughout the procedure under my direct supervision. FLUOROSCOPY TIME:  Fluoroscopy Time: 17  minutes 54 seconds (338.2NKN) COMPLICATIONS: None immediate. PROCEDURE: Following a full explanation of the procedure along with the potential associated complications, an informed witnessed consent was obtained. The patient was placed prone on the fluoroscopic table. The skin overlying the thoracolumbar region was then prepped and draped in the usual sterile fashion. The right pedicle at T12, and left pedicle at L1 were then infiltrated with 0.25% bupivacaine followed by the advancement of an 11-gauge Jamshidi needle through the right pedicle at T12 and the left pedicle at L1 into the posterior one-third at the two levels. These were then exchanged for a Kyphon advanced osteo introducer system comprised of a working cannula and a Kyphon osteo drill. This combinations were then advanced over a Kyphon osteo bone pin until the tips of the Kyphon osteo drills were in the posterior third at the two levels. At this time, the bone pins were removed. In a medial trajectory, the combinations were was advanced until the tips of the working cannulae were inside the posterior one-third at the two levels. The osteo drills were removed. Through the working cannula, a Kyphon inflatable bone tamp 20 x 3 were advanced and positioned with the distal marker 5 mm from the anterior aspect of T12 and L1. Crossing of the midline was seen on the AP projection. At this time, the balloons were expanded using contrast via a Kyphon inflation syringe device via microtubing. Inflations were continued until there was apposition with the superior and the inferior endplates. At this time, methylmethacrylate mixture was reconstituted with Tobramycin in the Kyphon bone mixing device system. This was then loaded onto the Kyphon bone fillers. The balloons were deflated and removed followed by the instillation of 6 bone filler equivalents of methylmethacrylate mixture at L1, and 3 bone filler equivalents at T12 with excellent filling in the AP and  lateral projections. No extravasation was noted in the disk spaces or posteriorly into the spinal canal. No epidural venous contamination was seen. The working cannulae and the bone fillers were then retrieved and removed. IMPRESSION: 1. Status post vertebral body augmentation using balloon kyphoplasty at T12 and L1 as described without event. Electronically Signed   By: Luanne Bras M.D.   On: 02/02/2021 17:27   IR KYPHO EA ADDL LEVEL THORACIC OR LUMBAR  Result Date: 02/04/2021 INDICATION: Severe low back pain secondary to compression fractures at T12 and L1. EXAM: BALLOON KYPHOPLASTIES AT T12 AND L1 COMPARISON:  MRI the lumbosacral spine of Jan 29, 2021. MEDICATIONS: As antibiotic prophylaxis, none was ordered pre-procedure and administered intravenously within 1 hour of incision. ANESTHESIA/SEDATION: Moderate (conscious) sedation was employed during this procedure. A total of Versed 2 mg and Fentanyl 62.5 mcg was administered intravenously. Moderate Sedation Time: 38 minutes. The patient's level of consciousness and vital signs were monitored continuously by radiology nursing throughout the procedure under my direct supervision. FLUOROSCOPY TIME:  Fluoroscopy Time: 17 minutes 54 seconds (397.6BHA) COMPLICATIONS: None immediate. PROCEDURE: Following a full explanation of the procedure along with the potential associated complications, an informed  witnessed consent was obtained. The patient was placed prone on the fluoroscopic table. The skin overlying the thoracolumbar region was then prepped and draped in the usual sterile fashion. The right pedicle at T12, and left pedicle at L1 were then infiltrated with 0.25% bupivacaine followed by the advancement of an 11-gauge Jamshidi needle through the right pedicle at T12 and the left pedicle at L1 into the posterior one-third at the two levels. These were then exchanged for a Kyphon advanced osteo introducer system comprised of a working cannula and a Kyphon  osteo drill. This combinations were then advanced over a Kyphon osteo bone pin until the tips of the Kyphon osteo drills were in the posterior third at the two levels. At this time, the bone pins were removed. In a medial trajectory, the combinations were was advanced until the tips of the working cannulae were inside the posterior one-third at the two levels. The osteo drills were removed. Through the working cannula, a Kyphon inflatable bone tamp 20 x 3 were advanced and positioned with the distal marker 5 mm from the anterior aspect of T12 and L1. Crossing of the midline was seen on the AP projection. At this time, the balloons were expanded using contrast via a Kyphon inflation syringe device via microtubing. Inflations were continued until there was apposition with the superior and the inferior endplates. At this time, methylmethacrylate mixture was reconstituted with Tobramycin in the Kyphon bone mixing device system. This was then loaded onto the Kyphon bone fillers. The balloons were deflated and removed followed by the instillation of 6 bone filler equivalents of methylmethacrylate mixture at L1, and 3 bone filler equivalents at T12 with excellent filling in the AP and lateral projections. No extravasation was noted in the disk spaces or posteriorly into the spinal canal. No epidural venous contamination was seen. The working cannulae and the bone fillers were then retrieved and removed. IMPRESSION: 1. Status post vertebral body augmentation using balloon kyphoplasty at T12 and L1 as described without event. Electronically Signed   By: Luanne Bras M.D.   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  He has renal insufficiency of 1.9,  but this is not significantly above his baseline.  He has a history of hypercalcemia, calcium levels are normal today.  He has a mild anemia of 11.2.  Have ordered a metastatic bone survey for completeness and results are pending at time of dictation.  He likely will require bone marrow biopsy to confirm the diagnosis.  Patient will return to clinic in 2 weeks to discuss his results. 2.  Pain: Patient currently only taking tramadol and Robaxin.  Can consider low-dose narcotic in the future if needed. 3.  Renal insufficiency: Patient's creatinine is 1.9, monitor. 4.  Anemia: Mild, monitor. 5.  Concern for bone lesions: Metastatic bone survey as above.  I spent a total of 60 minutes reviewing chart data, face-to-face evaluation with the patient, counseling and coordination of care as detailed above.   Patient expressed understanding and was in agreement with this plan. He also understands that He can call clinic at any time with any questions, concerns, or complaints.   Cancer Staging No matching staging information was found for the patient.  Lloyd Huger,  MD   02/20/2021 9:21 PM

## 2021-02-22 DIAGNOSIS — D472 Monoclonal gammopathy: Secondary | ICD-10-CM | POA: Diagnosis not present

## 2021-02-23 ENCOUNTER — Other Ambulatory Visit: Payer: Self-pay

## 2021-02-23 DIAGNOSIS — D472 Monoclonal gammopathy: Secondary | ICD-10-CM

## 2021-02-23 LAB — PROTEIN ELECTRO, RANDOM URINE
Albumin ELP, Urine: 7.5 %
Alpha-1-Globulin, U: 0.5 %
Alpha-2-Globulin, U: 4.3 %
Beta Globulin, U: 7.3 %
Gamma Globulin, U: 80.5 %
M Component, Ur: 75 % — ABNORMAL HIGH
Total Protein, Urine: 576.4 mg/dL

## 2021-02-24 ENCOUNTER — Other Ambulatory Visit: Payer: Self-pay

## 2021-02-24 ENCOUNTER — Ambulatory Visit (INDEPENDENT_AMBULATORY_CARE_PROVIDER_SITE_OTHER): Payer: PPO | Admitting: Physician Assistant

## 2021-02-24 VITALS — BP 130/88 | HR 78 | Ht 69.0 in | Wt 180.0 lb

## 2021-02-24 DIAGNOSIS — D631 Anemia in chronic kidney disease: Secondary | ICD-10-CM

## 2021-02-24 DIAGNOSIS — E781 Pure hyperglyceridemia: Secondary | ICD-10-CM | POA: Diagnosis not present

## 2021-02-24 DIAGNOSIS — E785 Hyperlipidemia, unspecified: Secondary | ICD-10-CM | POA: Diagnosis not present

## 2021-02-24 DIAGNOSIS — N1832 Chronic kidney disease, stage 3b: Secondary | ICD-10-CM | POA: Diagnosis not present

## 2021-02-24 DIAGNOSIS — I77819 Aortic ectasia, unspecified site: Secondary | ICD-10-CM

## 2021-02-24 DIAGNOSIS — I2510 Atherosclerotic heart disease of native coronary artery without angina pectoris: Secondary | ICD-10-CM | POA: Diagnosis not present

## 2021-02-24 DIAGNOSIS — I700 Atherosclerosis of aorta: Secondary | ICD-10-CM | POA: Diagnosis not present

## 2021-02-24 DIAGNOSIS — I491 Atrial premature depolarization: Secondary | ICD-10-CM | POA: Diagnosis not present

## 2021-02-24 DIAGNOSIS — I77810 Thoracic aortic ectasia: Secondary | ICD-10-CM | POA: Diagnosis not present

## 2021-02-24 DIAGNOSIS — I5189 Other ill-defined heart diseases: Secondary | ICD-10-CM | POA: Diagnosis not present

## 2021-02-24 DIAGNOSIS — Z79899 Other long term (current) drug therapy: Secondary | ICD-10-CM

## 2021-02-24 DIAGNOSIS — I10 Essential (primary) hypertension: Secondary | ICD-10-CM

## 2021-02-24 DIAGNOSIS — I471 Supraventricular tachycardia: Secondary | ICD-10-CM

## 2021-02-24 DIAGNOSIS — I480 Paroxysmal atrial fibrillation: Secondary | ICD-10-CM

## 2021-02-24 LAB — UPEP/TP, 24-HR URINE
Albumin, U: 10.2 %
Alpha 1, Urine: 0.9 %
Alpha 2, Urine: 7.1 %
Beta, Urine: 17.3 %
Gamma Globulin, Urine: 64.4 %
M-Spike, mg/24 hr: 2267.2 mg/24 hr — ABNORMAL HIGH
M-spike, %: 59.3 % — ABNORMAL HIGH
Total Protein, Urine-Ur/day: 3823 mg/24 hr — ABNORMAL HIGH (ref 30–150)
Total Protein, Urine: 449.8 mg/dL
Total Volume: 850

## 2021-02-24 MED ORDER — ATORVASTATIN CALCIUM 40 MG PO TABS: 40.0000 mg | ORAL_TABLET | Freq: Every day | ORAL | 3 refills | Status: DC

## 2021-02-24 MED ORDER — CARVEDILOL 12.5 MG PO TABS: 12.5000 mg | ORAL_TABLET | Freq: Two times a day (BID) | ORAL | 3 refills | Status: DC

## 2021-02-24 MED FILL — ATORVASTATIN 40 MG TABLET: 90 days supply | Qty: 90 | Fill #0

## 2021-02-24 NOTE — Patient Instructions (Addendum)
Medication Instructions:  Your physician has recommended you make the following change in your medication:   INCREASE Carvedilol to 12.5mg  TWICE daily   *If you need a refill on your cardiac medications before your next appointment, please call your pharmacy*   Lab Work:  Today:  CBC, CMET, TSH, Lipid panel, Direct LDL   If you have labs (blood work) drawn today and your tests are completely normal, you will receive your results only by: [place] (if you have MyChart) OR A paper copy in the mail If you have any lab test that is abnormal or we need to change your treatment, we will call you to review the results.   Testing/Procedures:  None ordered  Follow-Up: At [facility] you and your health needs are our priority.  As part of our continuing mission to provide you with exceptional heart care, we have created designated Provider Care Teams.  These Care Teams include your primary Cardiologist (physician) and Advanced Practice Providers (APPs -  Physician Assistants and Nurse Practitioners) who all work together to provide you with the care you need, when you need it.  We recommend signing up for the patient portal called "MyChart".  Sign up information is provided on this After Visit Summary.  MyChart is used to connect with patients for Virtual Visits (Telemedicine).  Patients are able to view lab/test results, encounter notes, upcoming appointments, etc.  Non-urgent messages can be sent to your provider as well.   To learn more about what you can do with MyChart, go to [email or web address].    Your next appointment:   4 month(s)  The format for your next appointment:   In Person  Provider:   You may see Nelva Bush, MD or one of the following Advanced Practice Providers on your designated Care Team:    Marrianne Mood, PA-C    Other Instructions  We recommend a maximum of 2g sodium per day and 2L total fluid per day. Fluids include coffee, tea, water,  and juice.  In addition, we recommend you monitor both your daily weight and daily BP at the same time each day - bring this long into the office.    Patients with kidney issues should generally stay away from medicines like ibuprofen, Advil, Motrin, naproxen, and Aleve due to risk of worsening kidney function so do not take any for now. Remove from med list. Patient may take Tylenol as directed or talk to primary doctor about alternatives for pain issues.   Blood pressure: --We recommend upper arm BP cuffs over that of the wrist. --You can always check your device's accuracy against an office model once a year if possible. You can do so by bringing your cuff into the office and notifying the person that rooms you that you would like to check your cuff against our office readings. --Measure your BP at the same time each day. Blood pressure varies often throughout the day with higher readings in the morning. BP may also be lower at home than in the office. Do not measure BP right after you wake up or after exercising. Avoid caffeine, tobacco, and alcohol for 30 minutes before taking a measurement.  --Sit quietly for five minutes in a comfortable position with your legs and ankles uncrossed and back supported. Feet flat on the ground. Have your arm supported and at the level of your heart. Always use the same arm when taking your blood pressure. Place the cuff over bare skin rather than clothing. Each time  you measure, take an additional reading if abnormal to ensure accurate by waiting 1-3 minutes after the first reading. --Please bring a BP log into the office. It is helpful to document the time of each BP reading, as well as any activity or medications taken around the reading. In addition, it is helpful to include heart rate at the time of the BP reading. Daily weights are also encouraged. --Goal BP is 130/80 or lower. If your BP is low but you are not dizzy, this is fine and preferred over BP higher  than 130/80. If your BP is less than 100 for the top number and you are dizzy, call the office. If your blood pressure is consistently elevated with top number above 180 and bottom above 120, this can damage the body. If you have severe increase in your blood pressure or concerning symptoms of severe chest pain, headache with confusion and blurred vision, severe abdominal or back pain, shortness of breath, seizures, or loss of consciousness, go to the emergency department.    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 The word "aneurysm" refers to a bulge in an artery (blood vessel). Most people think of them in the context of an emergency, but yours was found incidentally. At this point there is nothing you need to do from a procedure standpoint, but there are some important things to keep in mind for day-to-day life.  Mainstays of therapy for aneurysms include very good blood pressure control, healthy lifestyle, and avoiding tobacco products and street drugs. Research has raised concern that antibiotics in the fluoroquinolone class could be associated with increased risk of having an aneurysm develop or tear. This includes medicines that end in "floxacin," like Cipro or Levaquin. Make sure to discuss this information with other healthcare providers if you require antibiotics.  Since aneurysms can run in families, you should discuss your diagnosis with first degree relatives as they may need to be screened for this. Regular mild-moderate physical exercise is important, but avoid heavy lifting/weight lifting over 30lbs, chopping wood, shoveling snow or digging heavy earth with a shovel. It is best to avoid activities that cause grunting or straining (medically referred to as a "Valsalva maneuver"). This happens when a person bears down against a closed throat to increase the strength of arm or abdominal muscles. There's often a tendency to do this  when lifting heavy weights, doing sit-ups, push-ups or chin-ups, etc., but it may be harmful.  This is a finding I would expect to be monitored periodically by your cardiology team. Most unruptured thoracic aortic aneurysms cause no symptoms, so they are often found during exams for other conditions. Contact a health care provider if you develop any discomfort in your upper back, neck, abdomen, trouble swallowing, cough or hoarseness, or unexplained weight loss. Get help right away if you develop severe pain in your upper back or abdomen that may move into your chest and arms, or any other concerning symptoms such as shortness of breath or fever.

## 2021-02-24 NOTE — Progress Notes (Signed)
Office Visit    Patient Name: Jack [name] Date of Encounter: 02/24/2021  PCP:  [name], MD   [place]  Cardiologist:  Nelva Bush, MD  Advanced Practice Provider:  No care team member to display Electrophysiologist:  None :[phone]   Chief Complaint    Chief Complaint  Patient presents with   Follow-up    1 month    87 y.o. male with history of paroxysmal atrial fibrillation, pSVT, PACs, MAT, coronary calcium and aortic atherosclerosis as seen on CT, G1DD/diastolic dysfunction with EF low normal 50 to 55%, aortic root dilation and ascending aorta dilation, hypertension, hyperlipidemia, hypertriglyceridemia, GERD, CKD stage IIIb, BPH, renal stone, possible myeloma with visit showing patient has established with oncology, prior tobacco use (quit 55 years ago), current alcohol use, and seen today for follow-up of recent admission with atrial tachycardia and coronary calcification on CT as well as s/p cardiac monitoring with PSVT.  Past Medical History    Past Medical History:  Diagnosis Date   Elevated prostate specific antigen (PSA)    has been 7 for a year    GERD (gastroesophageal reflux disease)    History of colon polyps 2008   [facility],    History of kidney stones    Hyperlipidemia    Hypertension    Prostate hypertrophy    diagnosed at age 22 due to hematospermia   Renal disorder    Past Surgical History:  Procedure Laterality Date   CATARACT EXTRACTION W/PHACO Left 01/10/2018   Procedure: CATARACT EXTRACTION PHACO AND INTRAOCULAR LENS PLACEMENT ([place]);  Surgeon: Birder Robson, MD;  Location: [facility] ORS;  Service: Ophthalmology;  Laterality: Left;  [name] 00:24.8 AP% 14.9 CDE 3.68 Fluid pack lot # [id number]   CATARACT EXTRACTION W/PHACO Right 01/25/2018   Procedure: CATARACT EXTRACTION PHACO AND INTRAOCULAR LENS PLACEMENT ([facility]);  Surgeon: Birder Robson, MD;  Location: [facility] ORS;  Service: Ophthalmology;   Laterality: Right;  [name] 00:42 AP% 10.8 CDE 4.59 Fluid pack lot # [id number]   COLON SURGERY     CYSTOSCOPY W/ URETERAL STENT PLACEMENT Right 10/16/2017   Procedure: right  URETERAL STENT PLACEMENT,cystoscopy bilateral stent removal,rretrograde;  Surgeon: Abbie Sons, MD;  Location: [facility] ORS;  Service: Urology;  Laterality: Right;   CYSTOSCOPY/URETEROSCOPY/HOLMIUM LASER/STENT PLACEMENT Right 12/16/2020   Procedure: CYSTOSCOPY/URETEROSCOPY/HOLMIUM LASER/STENT PLACEMENT;  Surgeon: Abbie Sons, MD;  Location: [facility] ORS;  Service: Urology;  Laterality: Right;   EXTRACORPOREAL SHOCK WAVE LITHOTRIPSY Right 12/11/2020   Procedure: EXTRACORPOREAL SHOCK WAVE LITHOTRIPSY (ESWL);  Surgeon: Abbie Sons, MD;  Location: [facility] ORS;  Service: Urology;  Laterality: Right;   IR KYPHO EA ADDL LEVEL THORACIC OR LUMBAR  02/02/2021   IR KYPHO LUMBAR INC FX REDUCE BONE BX UNI/BIL CANNULATION INC/IMAGING  02/02/2021   KIDNEY STONE SURGERY     RESECTION SOFT TISSUE TUMOR LEG / ANKLE RADICAL  jan 2009   [facility],  right thigh/knee , nonmalignant   SMALL INTESTINE SURGERY  1946   implaed on picket fence, punctured stomach   TONSILLECTOMY      Allergies  Allergies  Allergen Reactions   Quinolones     Aortic dilation contraindicates FQ   Amlodipine Other (See Comments)    LE edema   Azithromycin Nausea And Vomiting   Lisinopril Cough    History of Present Illness    Jack [name] is a 87 y.o. male with PMH as above.  He lives at [address] in [place] with his wife  of 19 years.  He has a son and daughter, involved in his care.  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  He quit smoking 55 years ago.  He reports current alcohol use at 1 drink per week.  No drug use.  He was admitted 4 days ago with right flank and mid back pain.  This was ongoing for several weeks and initially treated with lithotripsy of a 1.5 cm right ureteral stone.  After the procedure, he continued to have back  pain with repeat stone intervention on 12/16/2020.  He continued to have pain after repeat intervention and ultimately found to have compression fracture of T12.  He was hospitalized for 4 days for pain control and physical therapy.  No surgical interventions were recommended by neurosurgery.  During admission, he was noted to have tachycardia with irregular rhythm on telemetry.  He was asymptomatic and denied chest pain, shortness of breath, palpitations, or presyncope.  No reported history of arrhythmia or heart disease.  He was concerned that he may have missed some of his pain medications in short succession, triggering the arrhythmia.  EKG at time of consultation showed NSR with LAFB and no significant ST/T changes.  Telemetry showed NSR with period of sinus rhythm with frequent PACs and possible multifocal atrial tachycardia the previous afternoon.  No definitive evidence of atrial fibrillation was identified.  Recommendation was to obtain echo and place a 14-day event monitor at discharge.  Echo showed EF 50 to 55%, NR WMA, mild LVH, G1 DD, mild AR, moderate dilation of the ascending aorta (48 mm) and mild dilation of the aortic root (40 mm).  Outpatient monitoring of his aorta was recommended.  No anticoagulation was indicated at that time given lack of clear evidence of atrial fibrillation.  Given lack of symptoms, beta-blocker was deferred.  He was continued on losartan for hypertension.  Coronary artery calcification and aortic atherosclerosis were incidentally noted on recent unenhanced CT of the abdomen and pelvis.  Given no symptoms concerning for coronary insufficiency, further work-up was deferred.  It was recommended he continue medical therapy to prevent progression of ASCVD with BP and lipid control.  Subsequent cardiac monitoring resulted 01/22/21 showed 4 days and 3 hours of monitoring with NSR with average rate 86 bpm and range 56 to 128 bpm.  Rare PACs and PVCs were noted.  165 atrial  runs/paroxysmal SVT were observed, lasting up to 16.8 seconds with maximum rate 193 bpm.  No sustained arrhythmia or prolonged pauses were noted.  Patient triggered events corresponded to sinus rhythm.  Shortly after cardiac monitoring was completed, cardiology was reconsulted with patient admitted 5/19 to [facility] with severe back pain and plan for vertebroplasty.  During admission, NSR was noted with conversion to atrial fibrillation with RVR around 1600.  Rate control was difficult due to BP after administration of IV diltiazem.  Cardiology consulted, but he spontaneously converted to sinus tachycardia after 2 hours of atrial fibrillation.  He was asymptomatic and unaware of the change in rhythm.  Twelve-lead EKG performed after that time showed clear evidence of atrial fibrillation.  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  No ischemic changes..  It was thought that his atrial fibrillation was likely to recur due to hyperabduction checked state related to pain in anticipation of surgery.  IV amiodarone was recommended if arrhythmia recurred.  He was started on IV heparin with recommendation to discharge home with DOAC such as Eliquis or Xarelto  once felt safe to do so from a bleeding standpoint after his surgery.  Chads Vascor was estimated at 4 due to history of hypertension, vascular disease, and age with 4.890 risk of stroke.  He was later started on a short course of oral amiodarone with 400 mg twice daily for 7 days then 200 mg daily for 21 days.  Recommendation was for Eliquis after surgery.   It was noted that formal evaluation for coronary insufficiency could be performed at a later date, due to his coronary and aortic atherosclerosis seen on imaging.  No symptoms were reported concerning for angina.  He was started on atorvastatin.  He underwent kyphoplasty 02/02/2021.  There was suspicion of left fifth rib fracture, occurring while transitioning from the stretcher in the  hospital.  He had mild hypercalcemia with SPEP and UPEP concerning for possible myeloma.    He was referred to the cancer center for further work-up.  Palliative care has been consulted to help manage goals at this time.  On review of oncology documentation, assessment was [place] with concern for multiple myeloma.  Repeat SPEP only revealed an M spike of 0.1 and patient immunoglobulins essentially normal.  He had a significantly elevated kappa free light chain, concerning for kappa chain myeloma.  Also noted was renal insufficiency with creatinine 1.9, though this was not above his baseline.  He had history of hypercalcemia with calcium levels normal at that time and mild anemia with hemoglobin 11.2.  Metastatic bone survey was ordered and it was estimated he would require bone marrow biopsy to confirm diagnosis.  He plan to return to clinic in 2 weeks to discuss the results.    Per Palliative care documentation, he was seen at follow-up and week, relying on his wife for most daily care.  He was ambulating with a walker and mostly sitting in the chair to 2 weakness and pain.  He was sleeping in the chair at night, given he was unable to lay flat due to pain.  He reported constipation.  Today, 02/24/2021, he returns to clinic and is joined today by his family.  He reports ongoing weakness and pain in the setting of his kyphoplasty and left fifth rib fracture.  We reviewed his recent oncology and palliative care visits together.  We also discussed his recent admission with paroxysmal atrial fibrillation and the suspicion that this might be due to his pain at the time of his admission.  Given his atrial fibrillation, he was started on reduced dose Eliquis with recommendation with obtain repeat labs given he has now been on this medication at least 2 weeks.  We discussed risk factor modification as well, given his coronary artery calcium seen on CTA.  He denies any chest pain or shortness of breath at rest.   Unable to assess any exertional symptoms at this time and given he is limited by his recent kyphoplasty /  pain levels.  We reviewed exertional anginal symptoms that would be concerning for coronary insufficiency that he will monitor for moving forward, such as chest pain or shortness of breath with exertion, especially if exertional symptoms progress in intensity or frequency, or if they then occur at rest.  We also discussed further noninvasive ischemic work-up with plan to defer for now and reassess once he is recovered from his kyphoplasty / rib fracture and pending goals of care and further workup of multiple myeloma. He reports medication compliance with Elqiuis. No s/sx of bleeding. He denies any tachypalpitations or sx  with PAF/PSVT.  No presyncope or syncope. We reviewed the recommendation for head CT if he hits his head on [place]. He has occasional LEE but otherwise denies signs or symptoms of volume overload.  He reports medication compliance.  He is tolerating his medications well at this time.    Home Medications   Current Outpatient Medications  Medication Instructions   acetaminophen (TYLENOL) 650 mg, Oral, Every 6 hours PRN   apixaban (ELIQUIS) 2.5 mg, Oral, 2 times daily   atorvastatin (LIPITOR) 40 mg, Oral, Daily-1800   calcitonin, salmon, (MIACALCIN/FORTICAL) 200 UNIT/ACT nasal spray 1 spray, Alternating Nares, Daily   carvedilol (COREG) 12.5 mg, Oral, 2 times daily with meals   docusate sodium (COLACE) 100 mg, Oral, 2 times daily   gabapentin (NEURONTIN) 100 mg, Oral, 3 times daily   Multiple Vitamins-Minerals (PRESERVISION AREDS 2 PO) 1 capsule, Oral, 2 times daily   tamsulosin (FLOMAX) 0.4 mg, Oral, Daily     Review of Systems    He denies chest pain, palpitations, dyspnea, pnd, orthopnea, n, v, dizziness, syncope, weight gain, or early satiety. Occasional LEE reported. He reports pain associated with rib injury and back.   All other systems reviewed and are otherwise negative  except as noted above.  Physical Exam    VS:  BP 130/88   Pulse 78   Ht _0  (1.753 m)   Wt 180 lb (81.6 kg)   BMI 26.58 kg/m  , BMI Body mass index is 26.58 kg/m. GEN: Well nourished, well developed, in no acute distress. Seated in chair. Back brace in place. HEENT: normal. Neck: Supple, no JVD, carotid bruits, or masses. Cardiac: RRR, no murmurs, rubs, or gallops. No clubbing, cyanosis. Moderate to 1+ bilateral LEE.  Radials/DP/PT 2+ and equal bilaterally.  Respiratory:  Respirations regular and unlabored, clear to auscultation bilaterally. GI: Soft, nontender, nondistended, BS + x 4. MS: no deformity or atrophy. Skin: warm and dry, no rash. Neuro:  Strength and sensation are intact. Psych: Normal affect.  Accessory Clinical Findings    ECG personally reviewed by me today - normal sinus rhythm, 78 bpm, left axis deviation, LAFB, poor R wave progression leads II, 3, aVF, poor R wave progression precordial leads- no acute changes.  VITALS Reviewed today   Temp Readings from Last 3 Encounters:  02/19/21 (!) 96.2 F (35.7 C) (Tympanic)  02/07/21 97.8 F (36.6 C) (Oral)  01/07/21 98 F (36.7 C) (Oral)   BP Readings from Last 3 Encounters:  02/24/21 130/88  02/19/21 (!) 127/96  02/07/21 (!) 141/78   Pulse Readings from Last 3 Encounters:  02/24/21 78  02/19/21 76  02/07/21 75    Wt Readings from Last 3 Encounters:  02/24/21 180 lb (81.6 kg)  02/19/21 175 lb 11.2 oz (79.7 kg)  01/30/21 182 lb 12.2 oz (82.9 kg)     LABS  reviewed today   Lab Results  Component Value Date   WBC 3.6 (L) 02/19/2021   HGB 11.2 (L) 02/19/2021   HCT 34.2 (L) 02/19/2021   MCV 93.4 02/19/2021   PLT 292 02/19/2021   Lab Results  Component Value Date   CREATININE 1.90 (H) 02/19/2021   BUN 23 02/19/2021   NA 134 (L) 02/19/2021   K 4.2 02/19/2021   CL 96 (L) 02/19/2021   CO2 25 02/19/2021   Lab Results  Component Value Date   ALT 11 01/29/2021   AST 17 01/29/2021   ALKPHOS  104 01/29/2021   BILITOT 0.8 01/29/2021  Lab Results  Component Value Date   CHOL 225 (H) 08/23/2013   HDL 43.10 08/23/2013   LDLCALC 117 (H) 02/17/2009   LDLDIRECT 143.8 08/23/2013   TRIG 223.0 (H) 08/23/2013   CHOLHDL 5 08/23/2013    No results found for: HGBA1C Lab Results  Component Value Date   TSH 0.633 01/30/2021     STUDIES/PROCEDURES reviewed today   Zio XT 01/16/2021 The patient was monitored for 4 days, 3 hours. The predominant rhythm was sinus with an average rate of 86 bpm (range 56-128 bpm and sinus). There were rare PACs and PVCs. 165 atrial runs were observed, lasting up to 16.8 seconds with a maximum rate of 193 bpm. No sustained arrhythmia or prolonged pause was observed. Patient triggered event corresponds to sinus rhythm. Predominately sinus rhythm with rare PACs and PVCs, as well as multiple episodes of PSVT lasting up to 17 seconds.   Echo 01/07/21  1. Left ventricular ejection fraction, by estimation, is 50 to 55%. The  left ventricle has low normal function. The left ventricle has no regional  wall motion abnormalities. There is mild left ventricular hypertrophy.  Left ventricular diastolic  parameters are consistent with Grade I diastolic dysfunction (impaired  relaxation).   2. Right ventricular systolic function is normal. The right ventricular  size is normal.   3. The mitral valve is normal in structure. No evidence of mitral valve  regurgitation.   4. The aortic valve is tricuspid. Aortic valve regurgitation is mild.   5. Aortic dilatation noted. There is moderate dilatation of the ascending  aorta, measuring 48 mm. There is mild dilatation of the aortic root,  measuring 40 mm.   6. The inferior vena cava is normal in size with greater than 50%  respiratory variability, suggesting right atrial pressure of 3 mmHg.   Assessment & Plan    Newly diagnosed paroxysmal atrial fibrillation with rapid ventricular rate Paroxysmal SVT, Multifocal  atrial tachycardia, PACs --Asx.  Maintaining NSR today.  Admitted to [facility] April 2022 with monitor placed at discharge for possible PAF and subsequent Zio showing paroxysmal SVT but no clear evidence of atrial fibrillation.  During [facility] admission, PACs and MAT noted on telemetry without associated symptoms.  Zio showed paroxysmal SVT but no clear evidence of A. fib.  Subsequent admission at [facility] May 2022 with atrial fibrillation identified on telemetry by [facility] team and initiation of amiodarone for 21 days and reduced dose Eliquis at that time.  Amiodarone has since been discontinued and anticoagulation continued without any report of bleeding on review of symptoms today.  Echo obtained 12/2020 with EF 50 to 55% and NRWMA, G1 DD, mild AR, mild TR, trivial PR. Continue carvedilol with dose increased to 12.5 mg twice daily today, given elevated BP and for additional rate control moving forward. Continue reduced dose Eliquis 2.5 mg twice daily for anticoagulation. Reduced dose Eliquis appropriate = meets criteria due to age and renal function. CHA2DS2VASc score of at least 3 (HTN, agex2) with recommendation for indefinite anticoagulation to prevent thromboembolic event as discussed with pt. BMET, CBC to be repeated today for monitoring labs. Check TSH to ensure at goal. Recheck H&H to ensure stable on [place]. Recheck Cr to ensure still meets criteria for reduced dose OAC. Ensure electrolytes and TSH wnl.  Coronary artery calcification and aortic atherosclerosis --Denies any symptoms concerning for angina or coronary insufficiency, though unable to assess exertional symptoms at this time given his recent kyphoplasty and hip fracture.  Coronary calcification and  aortic atherosclerosis incidentally noted on recent unenhanced CT of the abdomen and pelvis.  Given lack of anginal symptoms and as discussed today, it is reasonable to defer further work-up at this time and instead continue risk factor  modification through medical management and lifestyle changes as we discussed during today's visit.  Of note, echo did show low normal EF 50 to 55% but NRWMA, as reviewed with patient today. Risk factors for CAD at this time include male, age, CAD seen on CTA, hypertension, hyperlipidemia, previous history of tobacco use.  Reassess MPI at RTC. Blood pressure control recommended with goal BP <130/80.   Increased carvedilol to 12.5 mg twice daily, given patient desire to avoid additional medications at this time. Hydralazine 16m q8h has fallen off his list since his admissions in 12/2020 and 01/2021. Ideally, he will remain off of hydralazine given side effect profile of this medication, which includes reflex tachycardia. However, given his renal function, if further BP control needed and at RTC escalation of BB precluded by HR, consider adding back hydralazine. Will defer for now given plan for increased dose BB.  Given renal insufficiency, avoiding restart of ARB at this time. Previous PTA Losartan was discontinued at discharge from 12/2020 admission likely due to poor renal function. Lipid control recommended with LDL below 70.   Continue current statin with Lipitor 470mdaily, started at his 12/2020 admission. Recheck lipid and liver function today to ensure at goal.  Diastolic dysfunction / HFmrEF -- Euvolemic and well compensated on exam, though bilateral lower extremity edema reported and at least moderate to 1+ as noted in physical exam above.  Echo showed EF low normal at 50 to 55%, G1 DD, and findings as above.  He is not currently on a standing diuretic and will continue to defer at this time. Consider that some of his lower extremity edema may be 2/2 third spacing given his renal function / renal insufficiency.  Also considered is at least some element of dependent edema, given he is limited in his ability to be active during the day and spends most of his time with legs in dependent position given  his back pain and s/p kyphoplasty procedure with current known fractured rib.  He prefers to defer nephrology referral at this time. Leg elevation and compression stockings for ongoing lower extremity edema.  Daily weights and BP monitoring recommended.  Salt and fluid restrictions reviewed.  Recommended he establish with nephrology and with patient preference to defer at this time, which is reasonable given his current work-up by oncology and palliative care as above. Reassess at RTC. Continue current medications.  Aortic dilation (4822mscending, 29m22mot) -- Aortic dilation noted on 01/07/2021 echo with 48 mm dilation of the ascending aorta and 40 mm dilation of the aortic root.   Per pt preference, defer CT to confirm dilation size +/- referral to vascular at request / per pt preference and given current comorbids / oncology workup though reviewed recommendation for intervention once at 50mm38mation.  Annual echo and CT to monitor.   Avoid fluoroquinolones, added to his medication intolerances, avoid heavy lifting.  Reviewed symptoms of dissection with consideration of his current back pain which complicates the acute back pain sx associated with dissection.  Strict BP, HR, LDL control.  Continue ASA, statin, and BB going forward.   HLD, LDL goal below 70 Hypertriglyceridemia --During his April 2022 admission, coronary calcium and aortic atherosclerosis seen on CT with recommendation for LDL goal below 70.  He  was started on Lipitor 40 mg daily at that time (12/2020) for risk factor modification.   Continue Lipitor 83m daily.  Recheck of lipids and liver function today to ensure at goal of below 70 on current dose of statin, and as it has been 6 to 8 weeks since he was started on this medication. If LDL not at goal of below 70 or Tg still elevated, plan for addition of Zetia or increased dose statin to Lipitor 817mdaily for RF modification given CAC and Ao atherosclerosis on CT, as well as Ao  dilation as below. Could also consider Vascepa for Tg if still not well controlled.  Hypertension, goal BP less than 130/80 --BP borderline today as above and with recommendation for goal BP 130/80 or lower.  Given his renal function, we will defer from restart of PTA losartan and instead increase Coreg given room in heart rate today and pending repeat labs as above. Increase to carvedilol 12.5 mg twice daily. Escalation of beta-blocker preferred over restart of hydralazine (prescribed at discharge 01/2021) today 1- given room in heart rate and 2-given reflex tachycardia associated with hydralazine.  As above, he was discharged on hydralazine 100 mg every 8 hours after his 01/2021 admission, which has fallen off of his medication list since that time.   Daily wt, BP monitoring. Total daily fluids under 2 L/day, salt under 2 g/day.  Chronic kidney disease 3B --We will check BMET given medication changes as above and as his losartan was discontinued at the time of his 12/2020 discharge given CKD stage IIIb.  As previously noted, nephrology referral recommended with patient preference to defer at this time.  Reassess at RTC.  Recommend reduced dose Eliquis as above, given meets criteria by age and renal function.  Avoid nephrotoxins.  Renally dose all medications if indicated.  Anemia, likely of chronic disease --Recheck CBC today on anticoagulation.  No reported signs or symptoms consistent with bleeding.  Will ensure stable H&H since start of Eliquis.    Medication changes: Increase to carvedilol 12.5 mg twice daily Labs ordered: CBC, c-Met, TSH, lipid panel, direct LDL Studies / Imaging ordered: Deferred at this time. Future considerations: CT to confirm size of Ao dilation +/- referral to vascular. Lexiscan myoview for further ischemic workup. Restart of hydralazine if needed versus increased dose BB versus loop if renal function permits and  pending pt reassessment regarding referral to nephrology.   Disposition: RTC 4 months  *Please be aware that the above documentation was completed voice recognition software and may contain dictation errors.       JaArvil ChacoPA-C 02/24/2021

## 2021-02-25 ENCOUNTER — Telehealth: Payer: Self-pay | Admitting: *Deleted

## 2021-02-25 ENCOUNTER — Ambulatory Visit: Payer: PPO | Admitting: Urology

## 2021-02-25 LAB — CBC
Hematocrit: 35.3 % — ABNORMAL LOW (ref 37.5–51.0)
Hemoglobin: 11 g/dL — ABNORMAL LOW (ref 13.0–17.7)
MCH: 30.1 pg (ref 26.6–33.0)
MCHC: 31.2 g/dL — ABNORMAL LOW (ref 31.5–35.7)
MCV: 96 fL (ref 79–97)
Platelets: 249 10*3/uL (ref 150–450)
RBC: 3.66 x10E6/uL — ABNORMAL LOW (ref 4.14–5.80)
RDW: 15.7 % — ABNORMAL HIGH (ref 11.6–15.4)
WBC: 3.2 10*3/uL — ABNORMAL LOW (ref 3.4–10.8)

## 2021-02-25 LAB — COMPREHENSIVE METABOLIC PANEL
ALT: 7 IU/L (ref 0–44)
AST: 14 IU/L (ref 0–40)
Albumin/Globulin Ratio: 2.6 — ABNORMAL HIGH (ref 1.2–2.2)
Albumin: 4.5 g/dL (ref 3.6–4.6)
Alkaline Phosphatase: 195 IU/L — ABNORMAL HIGH (ref 44–121)
BUN/Creatinine Ratio: 12 (ref 10–24)
BUN: 23 mg/dL (ref 8–27)
Bilirubin Total: 0.4 mg/dL (ref 0.0–1.2)
CO2: 24 mmol/L (ref 20–29)
Calcium: 9.9 mg/dL (ref 8.6–10.2)
Chloride: 97 mmol/L (ref 96–106)
Creatinine, Ser: 1.9 mg/dL — ABNORMAL HIGH (ref 0.76–1.27)
Globulin, Total: 1.7 g/dL (ref 1.5–4.5)
Glucose: 99 mg/dL (ref 65–99)
Potassium: 4.9 mmol/L (ref 3.5–5.2)
Sodium: 136 mmol/L (ref 134–144)
Total Protein: 6.2 g/dL (ref 6.0–8.5)
eGFR: 34 mL/min/{1.73_m2} — ABNORMAL LOW (ref >59)

## 2021-02-25 LAB — LDL CHOLESTEROL, DIRECT: LDL Direct: 54 mg/dL (ref 0–99)

## 2021-02-25 LAB — LIPID PANEL
Chol/HDL Ratio: 3.6 ratio (ref 0.0–5.0)
Cholesterol, Total: 130 mg/dL (ref 100–199)
HDL: 36 mg/dL — ABNORMAL LOW (ref >39)
LDL Chol Calc (NIH): 57 mg/dL (ref 0–99)
Triglycerides: 228 mg/dL — ABNORMAL HIGH (ref 0–149)
VLDL Cholesterol Cal: 37 mg/dL (ref 5–40)

## 2021-02-25 LAB — TSH: TSH: 3.84 u[IU]/mL (ref 0.450–4.500)

## 2021-02-25 NOTE — Telephone Encounter (Signed)
Jack [name] with [employer] called asking for order approval for PT 2wk4. Please return her call if approve

## 2021-02-27 NOTE — Telephone Encounter (Signed)
Ok per [place].

## 2021-02-27 NOTE — Telephone Encounter (Signed)
VERBAL ORDER called to [place] who said she got Sharion Dove, NP for orders the other day

## 2021-03-07 NOTE — Progress Notes (Signed)
[facility]  [phone] [phone]  ID: [name] OB: 09-Nov-1932  MR#: [id number]  AST#:[id number]  Patient Care Team: [name], MD as PCP - General (Family Medicine) End, [name], MD as PCP - Cardiology (Cardiology) [name], MD (Endocrinology)  I connected with [name] on 03/12/21 at 10:15 AM EDT by video enabled telemedicine visit and verified that I am speaking with the correct person using two identifiers.   I discussed the limitations, risks, security and privacy concerns of performing an evaluation and management service by telemedicine and the availability of in-person appointments. I also discussed with the patient that there may be a patient responsible charge related to this service. The patient expressed understanding and agreed to proceed.   Other persons participating in the visit and their role in the encounter: Patient, MD.  Patient's location: Home. Provider's location: Clinic.  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  INTERVAL HISTORY: Patient switched his visit to video assisted telemedicine visit given significant worsening back pain over the past several days making it difficult to move and drive to clinic.  Patient states his pain improved after his recent kyphoplasties, but then over this past several days became significantly worse hindering his mobility even further.  He otherwise feels well.  He has no neurologic complaints.  He denies any recent fevers.  He has a fair appetite, but denies weight loss.  He has no chest pain, shortness of breath, cough, or hemoptysis.  He denies any nausea, vomiting, constipation, or diarrhea.  He has no urinary complaints.  Patient feels generally terrible, but offers no further specific complaints today.  REVIEW OF SYSTEMS:   Review of Systems  Constitutional:  Positive for malaise/fatigue. Negative for fever.  Respiratory: Negative.   Negative for cough, hemoptysis and shortness of breath.   Cardiovascular: Negative.  Negative for chest pain and leg swelling.  Gastrointestinal: Negative.  Negative for abdominal pain.  Genitourinary: Negative.  Negative for dysuria.  Musculoskeletal:  Positive for back pain.  Skin: Negative.  Negative for rash.  Neurological:  Positive for weakness. Negative for dizziness, focal weakness and headaches.  Psychiatric/Behavioral: Negative.  The patient is not nervous/anxious.    As per HPI. Otherwise, a complete review of systems is negative.  PAST MEDICAL HISTORY: Past Medical History:  Diagnosis Date   Elevated prostate specific antigen (PSA)    has been 7 for a year    GERD (gastroesophageal reflux disease)    History of colon polyps 2008   [facility],    History of kidney stones    Hyperlipidemia    Hypertension    Prostate hypertrophy    diagnosed at age 8 due to hematospermia   Renal disorder     PAST SURGICAL HISTORY: Past Surgical History:  Procedure Laterality Date   CATARACT EXTRACTION W/PHACO Left 01/10/2018   Procedure: CATARACT EXTRACTION PHACO AND INTRAOCULAR LENS PLACEMENT ([place]);  Surgeon: Birder Robson, MD;  Location: [facility];  Service: Ophthalmology;  Laterality: Left;  [name] 00:24.8 AP% 14.9 CDE 3.68 Fluid pack lot # [id number]   CATARACT EXTRACTION W/PHACO Right 01/25/2018   Procedure: CATARACT EXTRACTION PHACO AND INTRAOCULAR LENS PLACEMENT ([facility]);  Surgeon: Birder Robson, MD;  Location: [facility];  Service: Ophthalmology;  Laterality: Right;  [name] 00:42 AP% 10.8 CDE 4.59 Fluid pack lot # [id number]   COLON SURGERY     CYSTOSCOPY W/ URETERAL STENT PLACEMENT Right 10/16/2017   Procedure: right  URETERAL STENT PLACEMENT,cystoscopy bilateral stent removal,rretrograde;  Surgeon: Abbie Sons, MD;  Location: [facility];  Service: Urology;  Laterality: Right;   CYSTOSCOPY/URETEROSCOPY/HOLMIUM LASER/STENT PLACEMENT Right 12/16/2020   Procedure:  CYSTOSCOPY/URETEROSCOPY/HOLMIUM LASER/STENT PLACEMENT;  Surgeon: Abbie Sons, MD;  Location: [facility];  Service: Urology;  Laterality: Right;   EXTRACORPOREAL SHOCK WAVE LITHOTRIPSY Right 12/11/2020   Procedure: EXTRACORPOREAL SHOCK WAVE LITHOTRIPSY ([facility]);  Surgeon: Abbie Sons, MD;  Location: [facility];  Service: Urology;  Laterality: Right;   IR KYPHO EA ADDL LEVEL THORACIC OR LUMBAR  02/02/2021   IR KYPHO LUMBAR INC FX REDUCE BONE BX UNI/BIL CANNULATION INC/IMAGING  02/02/2021   KIDNEY STONE SURGERY     RESECTION SOFT TISSUE TUMOR LEG / ANKLE RADICAL  jan 2009   [facility],  right thigh/knee , nonmalignant   SMALL INTESTINE SURGERY  1946   implaed on picket fence, punctured stomach   TONSILLECTOMY      FAMILY HISTORY: Family History  Problem Relation Age of Onset   Hypertension Father    Hyperlipidemia Father    Cancer Sister        thyroid - dx in late 20's    ADVANCED DIRECTIVES (Y/N):  N  HEALTH MAINTENANCE: Social History   Tobacco Use   Smoking status: Former    Pack years: 0.00    Types: Cigarettes    Quit date: 07/05/1965    Years since quitting: 55.7   Smokeless tobacco: Never  Vaping Use   Vaping Use: Never used  Substance Use Topics   Alcohol use: Yes    Alcohol/week: 1.0 standard drink    Types: 1 Standard drinks or equivalent per week    Comment: occassionaly   Drug use: No     Colonoscopy:  PAP:  Bone density:  Lipid panel:  Allergies  Allergen Reactions   Quinolones     Aortic dilation contraindicates FQ   Amlodipine Other (See Comments)    LE edema   Azithromycin Nausea And Vomiting   Lisinopril Cough   Morphine And Related Nausea And Vomiting    No current facility-administered medications for this visit.   Current Outpatient Medications  Medication Sig Dispense Refill   apixaban (ELIQUIS) 2.5 MG TABS tablet Take 1 tablet (2.5 mg total) by mouth 2 (two) times daily. 60 tablet    atorvastatin (LIPITOR) 40 MG tablet Take 1 tablet (40  mg total) by mouth daily at 6 PM. 90 tablet 3   calcitonin, salmon, (MIACALCIN/FORTICAL) 200 UNIT/ACT nasal spray Place 1 spray into alternate nostrils daily. 3.7 mL 12   docusate sodium (COLACE) 100 MG capsule Take 1 capsule (100 mg total) by mouth 2 (two) times daily. (Patient not taking: Reported on 03/11/2021) 60 capsule 0   gabapentin (NEURONTIN) 100 MG capsule Take 100 mg by mouth 3 (three) times daily.     tamsulosin (FLOMAX) 0.4 MG CAPS capsule Take 1 capsule (0.4 mg total) by mouth daily. (Patient not taking: Reported on 03/11/2021) 30 capsule 3   acetaminophen (TYLENOL) 325 MG tablet Take 650 mg by mouth every 8 (eight) hours as needed for mild pain or moderate pain.     baclofen (LIORESAL) 10 MG tablet Take 10 mg by mouth 3 (three) times daily.     carvedilol (COREG) 12.5 MG tablet Take 1 tablet (12.5 mg total) by mouth 2 (two) times daily with a meal. 180 tablet 3   ondansetron (ZOFRAN) 8 MG tablet Take 1 tablet (8 mg total) by mouth every 8 (eight) hours as needed for nausea or vomiting. 45  tablet 0   oxyCODONE-acetaminophen (PERCOCET/ROXICET) 5-325 MG tablet Take 1 tablet by mouth every 6 (six) hours as needed for severe pain. 60 tablet 0   senna (SENOKOT) 8.6 MG TABS tablet Take 2 tablets by mouth at bedtime.     Facility-Administered Medications Ordered in Other Visits  Medication Dose Route Frequency Provider Last Rate Last Admin   acetaminophen (TYLENOL) tablet 650 mg  650 mg Oral Q6H PRN Ivor Costa, MD   650 mg at 03/11/21 2231   atorvastatin (LIPITOR) tablet 40 mg  40 mg Oral q1800 Ivor Costa, MD   40 mg at 03/11/21 1716   baclofen (LIORESAL) tablet 10 mg  10 mg Oral TID Ivor Costa, MD   10 mg at 03/11/21 2110   calcitonin (salmon) (MIACALCIN/FORTICAL) nasal spray 1 spray  1 spray Alternating Nares Daily Ivor Costa, MD   1 spray at 03/11/21 1719   carvedilol (COREG) tablet 25 mg  25 mg Oral BID WC Ivor Costa, MD   25 mg at 03/12/21 0729   ceFAZolin (ANCEF) IVPB 2g/100 mL premix   2 g Intravenous 60 min Pre-Op Hessie Knows, MD       fentaNYL (SUBLIMAZE) injection 12.5 mcg  12.5 mcg Intravenous Q2H PRN Ivor Costa, MD   12.5 mcg at 03/11/21 1802   gabapentin (NEURONTIN) capsule 100 mg  100 mg Oral TID Ivor Costa, MD   100 mg at 03/11/21 2109   hydrALAZINE (APRESOLINE) injection 5 mg  5 mg Intravenous Q2H PRN Ivor Costa, MD   5 mg at 03/11/21 1509   morphine 2 MG/ML injection 2-4 mg  2-4 mg Intravenous Q2H PRN Borders, Kirt Boys, NP   2 mg at 03/12/21 0728   ondansetron (ZOFRAN) injection 4 mg  4 mg Intravenous Q8H PRN Ivor Costa, MD   4 mg at 03/12/21 6962   oxyCODONE-acetaminophen (PERCOCET/ROXICET) 5-325 MG per tablet 1-2 tablet  1-2 tablet Oral Q4H PRN Borders, Kirt Boys, NP   1 tablet at 03/12/21 0111   polyethylene glycol (MIRALAX / GLYCOLAX) packet 17 g  17 g Oral BID PRN Ivor Costa, MD       polyethylene glycol (MIRALAX / GLYCOLAX) packet 17 g  17 g Oral Daily Borders, Kirt Boys, NP   17 g at 03/11/21 1513   promethazine (PHENERGAN) 12.5 mg in sodium chloride 0.9 % 50 mL IVPB  12.5 mg Intravenous Q6H PRN Mansy, Jan A, MD       senna (SENOKOT) tablet 8.6 mg  1 tablet Oral Daily Ivor Costa, MD   8.6 mg at 03/11/21 1246    OBJECTIVE: There were no vitals filed for this visit.    There is no height or weight on file to calculate BMI.    ECOG FS:2 - Symptomatic, <50% confined to bed  General: Well-developed, well-nourished, moderate distress secondary to pain. HEENT: Normocephalic. Neuro: Alert, answering all questions appropriately. Cranial nerves grossly intact. Psych: Normal affect.    LAB RESULTS:  Lab Results  Component Value Date   NA 140 03/12/2021   K 3.7 03/12/2021   CL 104 03/12/2021   CO2 26 03/12/2021   GLUCOSE 114 (H) 03/12/2021   BUN 15 03/12/2021   CREATININE 1.21 03/12/2021   CALCIUM 9.8 03/12/2021   PROT 6.7 03/11/2021   ALBUMIN 4.1 03/11/2021   AST 23 03/11/2021   ALT 8 03/11/2021   ALKPHOS 149 (H) 03/11/2021   BILITOT 1.0 03/11/2021    GFRNONAA 59 (L) 03/12/2021   GFRAA 29 (L) 10/16/2017  Lab Results  Component Value Date   WBC 3.4 (L) 03/12/2021   NEUTROABS 2.6 03/11/2021   HGB 11.5 (L) 03/12/2021   HCT 34.3 (L) 03/12/2021   MCV 91.7 03/12/2021   PLT 239 03/12/2021     STUDIES: CT ABDOMEN PELVIS W CONTRAST  Result Date: 03/11/2021 CLINICAL DATA:  87 year old male with abdominal pain. Recent diagnosis of MGUS, concern for multiple myeloma. EXAM: CT ABDOMEN AND PELVIS WITH CONTRAST TECHNIQUE: Multidetector CT imaging of the abdomen and pelvis was performed using the standard protocol following bolus administration of intravenous contrast. CONTRAST:  136m OMNIPAQUE IOHEXOL 300 MG/ML  SOLN COMPARISON:  Noncontrast CT Abdomen and Pelvis 01/29/2021 and earlier. Thoracic and lumbar MRI 01/29/2021. FINDINGS: Lower chest: Stable small to moderate hiatal hernia. Tortuous thoracic aorta. Calcified coronary artery atherosclerosis. No cardiomegaly or pericardial effusion. No pleural effusion. Stable mild lung base atelectasis or scarring. Hepatobiliary: Gallbladder is at the upper limits of normal but does not appear inflamed. Negative liver. No bile duct enlargement. Pancreas: Negative. Spleen: Negative. Adrenals/Urinary Tract: Normal adrenal glands. Nonobstructed kidneys with symmetric renal enhancement and contrast excretion. There is probable small benign left renal parapelvic cyst accounting for the increased left renal hilum soft tissue on series 7, image 26, stable since 2018. Decompressed ureters. Decompressed and negative bladder. Stomach/Bowel: Negative large bowel aside from retained stool in the right colon. Diminutive, negative appendix on coronal image 48. Flocculated material in the terminal ileum but no dilated small bowel. Aside from hiatal hernia the stomach is negative. Negative duodenum. No free air or free fluid. Vascular/Lymphatic: Aortoiliac calcified atherosclerosis. Major arterial structures remain patent in the  abdomen and pelvis. Tortuous iliac arteries. No lymphadenopathy. Reproductive: Prostatomegaly. Previous left inguinal hernia repair with mesh appears stable. Otherwise negative. Other: No pelvic free fluid. Musculoskeletal: Diffuse osteopenia and heterogeneous bone mineralization similar to the CT last month, distinctly different from a 2018 CT. Interval augmentation of the T12 and L1 compression fractures. New/progressed compression fractures of L2 and L3 since last month. No significant retropulsion of bone or other complicating features. Bone mineralization is especially heterogeneous in the lumbar spine and pelvis, and these are likely pathologic fractures in the setting of MGUS/myeloma. No other acute osseous abnormality identified. IMPRESSION: 1. Diffusely abnormal bone mineralization in keeping with MGUS/Multiple Myeloma with new or progressed pathologic compression fractures of L2 and L3 since last month. Interval augmentation of T12 and L1. 2. No other acute finding in the abdomen or pelvis. Small to moderate hiatal hernia. Prostatomegaly. Aortic Atherosclerosis (ICD10-I70.0). Electronically Signed   By: HGenevie AnnM.D.   On: 03/11/2021 09:53   DG Bone Survey Met  Result Date: 02/22/2021 CLINICAL DATA:  87year old male with T12 compression fracture. Evaluate for metastatic bone lesion. EXAM: METASTATIC BONE SURVEY COMPARISON:  None. FINDINGS: No acute osseous pathology. No definite metastatic osseous lesion identified. There is a small lucent lesion in the proximal third of the left femoral diaphysis, indeterminate. The bones are osteopenic. T12 and L1 compression fractures and vertebroplasty changes. Atherosclerotic calcification of the aorta. Pelvic hernia repair mesh. IMPRESSION: 1. No acute osseous pathology. No definite metastatic osseous lesion. 2. Small lucent lesion in the proximal third of the left femoral diaphysis, indeterminate. Electronically Signed   By: AAnner CreteM.D.   On:  02/22/2021 14:16    ASSESSMENT: MGUS, concern for multiple myeloma.  PLAN:    Likely kappa chain multiple myeloma: SPEP essentially negative and immunoglobulins are within normal limits, but patient has a significantly elevated kappa free  light chain.  He will require bone marrow biopsy to confirm the diagnosis.  PET scan has also been ordered for staging purposes.  He has a mild anemia, but his renal insufficiency has resolved.  He has no other evidence of endorgan damage.  Metastatic bone survey did not reveal any suspicious lesions.  Return to clinic 1 week after his biopsy and PET scan to discuss the results and treatment planning.   Pain: Patient was given a prescription for Percocet.  Recommendation was to go to the ER given his limited mobility, but patient declined.   Renal insufficiency: Resolved.   Anemia: Mild, monitor.  I provided 30 minutes of face-to-face video visit time during this encounter which included chart review, counseling, and coordination of care as documented above.  Patient expressed understanding and was in agreement with this plan. He also understands that He can call clinic at any time with any questions, concerns, or complaints.   Cancer Staging No matching staging information was found for the patient.  Lloyd Huger, MD   03/12/2021 9:41 AM

## 2021-03-10 ENCOUNTER — Inpatient Hospital Stay (HOSPITAL_BASED_OUTPATIENT_CLINIC_OR_DEPARTMENT_OTHER): Payer: PPO | Admitting: Oncology

## 2021-03-10 ENCOUNTER — Encounter: Payer: Self-pay | Admitting: Oncology

## 2021-03-10 ENCOUNTER — Telehealth: Payer: Self-pay | Admitting: Oncology

## 2021-03-10 ENCOUNTER — Other Ambulatory Visit: Payer: Self-pay

## 2021-03-10 ENCOUNTER — Telehealth: Payer: Self-pay

## 2021-03-10 ENCOUNTER — Inpatient Hospital Stay (HOSPITAL_BASED_OUTPATIENT_CLINIC_OR_DEPARTMENT_OTHER): Payer: PPO | Admitting: Hospice and Palliative Medicine

## 2021-03-10 DIAGNOSIS — G893 Neoplasm related pain (acute) (chronic): Secondary | ICD-10-CM

## 2021-03-10 DIAGNOSIS — D472 Monoclonal gammopathy: Secondary | ICD-10-CM

## 2021-03-10 DIAGNOSIS — Z515 Encounter for palliative care: Secondary | ICD-10-CM

## 2021-03-10 MED ORDER — ONDANSETRON HCL 8 MG PO TABS: 8.0000 mg | ORAL_TABLET | Freq: Three times a day (TID) | ORAL | 0 refills | Status: DC | PRN

## 2021-03-10 MED ORDER — CARVEDILOL 12.5 MG PO TABS: 12.5000 mg | ORAL_TABLET | Freq: Two times a day (BID) | ORAL | 3 refills | Status: DC

## 2021-03-10 MED ORDER — OXYCODONE-ACETAMINOPHEN 5-325 MG PO TABS: 1.0000 | ORAL_TABLET | Freq: Four times a day (QID) | ORAL | 0 refills | Status: DC | PRN

## 2021-03-10 MED FILL — ONDANSETRON HCL 8 MG TABLET: 15 days supply | Qty: 45 | Fill #0

## 2021-03-10 MED FILL — OXYCODONE-ACETAMINOPHEN 5-325: 15 days supply | Qty: 60 | Fill #0

## 2021-03-10 NOTE — Telephone Encounter (Signed)
CT Bone Marrow Biopsy scheduled for Tues 03/17/21 at 8:30 am, arrive at 7:30 am.  Please schedule him to see MD 1 week later an I will inform them of the appt details.

## 2021-03-10 NOTE — Telephone Encounter (Signed)
Patient's wife, [name], made aware of appointments.

## 2021-03-10 NOTE — Progress Notes (Signed)
Patient scheduled for virtual visit today. Patient reports worsening pain, not currently controlled well with tramadol.

## 2021-03-10 NOTE — Progress Notes (Signed)
Virtual Visit via Telephone Note  I connected with Jack [name] on 03/10/21 at 10:30 AM EDT by telephone and verified that I am speaking with the correct person using two identifiers.  Location: Patient: Home Provider: Clinic   I discussed the limitations, risks, security and privacy concerns of performing an evaluation and management service by telephone and the availability of in person appointments. I also discussed with the patient that there may be a patient responsible charge related to this service. The patient expressed understanding and agreed to proceed.   History of Present Illness: Jack [name] is a 87 y.o. male with multiple medical problems including hypertension, CKD stage IIIb, A. fib on Eliquis, and anemia.  Patient was hospitalized 01/02/2021 to 01/07/2021 with T12 compression fracture with recommendation for conservative management including use of a TLSO brace.  Patient was hospitalized again 01/29/2021 to 02/07/2021 with recurrent back pain and MR showing T12 and new L1 compression fracture.  Patient underwent T12/L1 kyphoplasty on 02/02/2021.  There is also suspicion of a left fifth rib fracture, which occurred while transitioning from the stretcher while in the hospital.  Patient had mild hypercalcemia with SPEP and UPEP concerning for possible myeloma.  Patient was referred to [facility] for work-up.  Palliative care was consulted to help address goals and manage ongoing symptoms.   Observations/Objective: Patient was scheduled for clinic visit but was unable to come due to worsening back pain.  I spoke with him instead by telephone.  He reports that he had tightened his back brace and subsequently has had worse generalized back pain.  He no longer feels like the tramadol is effective for pain.  Discussed options for pain management and patient also spoke earlier with Dr. Grayland Ormond.  Will send Rx for Percocet as needed for pain.  Patient otherwise reports that he is  doing reasonably well and denies significant changes or concerns.  Patient requests refills of his ondansetron and carvedilol.  Note that it is appears his PCP sent carvedilol last week but patient says that his pharmacy did not receive it.  We will resend as a courtesy but future refills by PCP.  Assessment and Plan: MGUS -with concern for multiple myeloma.  Work-up is pending  Pain -we will send Rx for Percocet.  DC tramadol.  Daily bowel regimen.  Follow Up Instructions: Follow-up MyChart 1 month   I discussed the assessment and treatment plan with the patient. The patient was provided an opportunity to ask questions and all were answered. The patient agreed with the plan and demonstrated an understanding of the instructions.   The patient was advised to call back or seek an in-person evaluation if the symptoms worsen or if the condition fails to improve as anticipated.  I provided 5 minutes of non-face-to-face time during this encounter.   Irean Hong, NP

## 2021-03-10 NOTE — Telephone Encounter (Signed)
Pts wife called pt is in severe pain and cant get up out of chair to come to appts. She wants to know what to do. Pt feels like his ribs are broke and cant do a lot of moving. Wanted to do a mychart they decide what to he needs to do next like ER or what. Stated he is taking tramdol and other pain meds every 2 hrs

## 2021-03-10 NOTE — Telephone Encounter (Signed)
Schedule request for bone marrow bx faxed to specialty scheduling.  Will f/u with MD 1 week after BMB.

## 2021-03-11 ENCOUNTER — Other Ambulatory Visit: Payer: Self-pay

## 2021-03-11 ENCOUNTER — Inpatient Hospital Stay
Admission: EM | Admit: 2021-03-11 | Discharge: 2021-03-17 | DRG: 477 | Disposition: A | Discharge status: Home Health | Payer: PPO | Attending: Internal Medicine | Admitting: Internal Medicine

## 2021-03-11 ENCOUNTER — Encounter: Payer: Self-pay | Admitting: Oncology

## 2021-03-11 ENCOUNTER — Emergency Department: Payer: PPO

## 2021-03-11 ENCOUNTER — Ambulatory Visit: Payer: PPO

## 2021-03-11 DIAGNOSIS — S32021A Stable burst fracture of second lumbar vertebra, initial encounter for closed fracture: Secondary | ICD-10-CM | POA: Diagnosis not present

## 2021-03-11 DIAGNOSIS — C9000 Multiple myeloma not having achieved remission: Secondary | ICD-10-CM | POA: Diagnosis present

## 2021-03-11 DIAGNOSIS — T402X5A Adverse effect of other opioids, initial encounter: Secondary | ICD-10-CM | POA: Diagnosis not present

## 2021-03-11 DIAGNOSIS — Z961 Presence of intraocular lens: Secondary | ICD-10-CM | POA: Diagnosis present

## 2021-03-11 DIAGNOSIS — Y92239 Unspecified place in hospital as the place of occurrence of the external cause: Secondary | ICD-10-CM | POA: Diagnosis not present

## 2021-03-11 DIAGNOSIS — Z7901 Long term (current) use of anticoagulants: Secondary | ICD-10-CM | POA: Diagnosis not present

## 2021-03-11 DIAGNOSIS — Z87891 Personal history of nicotine dependence: Secondary | ICD-10-CM

## 2021-03-11 DIAGNOSIS — E785 Hyperlipidemia, unspecified: Secondary | ICD-10-CM

## 2021-03-11 DIAGNOSIS — I10 Essential (primary) hypertension: Secondary | ICD-10-CM | POA: Diagnosis not present

## 2021-03-11 DIAGNOSIS — M8440XA Pathological fracture, unspecified site, initial encounter for fracture: Secondary | ICD-10-CM | POA: Diagnosis present

## 2021-03-11 DIAGNOSIS — N184 Chronic kidney disease, stage 4 (severe): Secondary | ICD-10-CM | POA: Diagnosis present

## 2021-03-11 DIAGNOSIS — Z881 Allergy status to other antibiotic agents status: Secondary | ICD-10-CM | POA: Diagnosis not present

## 2021-03-11 DIAGNOSIS — K5900 Constipation, unspecified: Secondary | ICD-10-CM | POA: Diagnosis not present

## 2021-03-11 DIAGNOSIS — S32030A Wedge compression fracture of third lumbar vertebra, initial encounter for closed fracture: Secondary | ICD-10-CM | POA: Diagnosis not present

## 2021-03-11 DIAGNOSIS — E43 Unspecified severe protein-calorie malnutrition: Secondary | ICD-10-CM | POA: Diagnosis present

## 2021-03-11 DIAGNOSIS — Z9842 Cataract extraction status, left eye: Secondary | ICD-10-CM | POA: Diagnosis not present

## 2021-03-11 DIAGNOSIS — Z6826 Body mass index (BMI) 26.0-26.9, adult: Secondary | ICD-10-CM

## 2021-03-11 DIAGNOSIS — M8440XS Pathological fracture, unspecified site, sequela: Secondary | ICD-10-CM | POA: Diagnosis not present

## 2021-03-11 DIAGNOSIS — Z20822 Contact with and (suspected) exposure to covid-19: Secondary | ICD-10-CM | POA: Diagnosis present

## 2021-03-11 DIAGNOSIS — I499 Cardiac arrhythmia, unspecified: Secondary | ICD-10-CM

## 2021-03-11 DIAGNOSIS — Z83438 Family history of other disorder of lipoprotein metabolism and other lipidemia: Secondary | ICD-10-CM | POA: Diagnosis not present

## 2021-03-11 DIAGNOSIS — S32000A Wedge compression fracture of unspecified lumbar vertebra, initial encounter for closed fracture: Secondary | ICD-10-CM | POA: Diagnosis present

## 2021-03-11 DIAGNOSIS — S32020A Wedge compression fracture of second lumbar vertebra, initial encounter for closed fracture: Secondary | ICD-10-CM | POA: Diagnosis not present

## 2021-03-11 DIAGNOSIS — Z888 Allergy status to other drugs, medicaments and biological substances status: Secondary | ICD-10-CM | POA: Diagnosis not present

## 2021-03-11 DIAGNOSIS — K219 Gastro-esophageal reflux disease without esophagitis: Secondary | ICD-10-CM | POA: Diagnosis present

## 2021-03-11 DIAGNOSIS — K5903 Drug induced constipation: Secondary | ICD-10-CM | POA: Diagnosis not present

## 2021-03-11 DIAGNOSIS — N400 Enlarged prostate without lower urinary tract symptoms: Secondary | ICD-10-CM

## 2021-03-11 DIAGNOSIS — M8458XA Pathological fracture in neoplastic disease, other specified site, initial encounter for fracture: Principal | ICD-10-CM | POA: Diagnosis present

## 2021-03-11 DIAGNOSIS — Z87442 Personal history of urinary calculi: Secondary | ICD-10-CM

## 2021-03-11 DIAGNOSIS — Z86010 Personal history of colonic polyps: Secondary | ICD-10-CM

## 2021-03-11 DIAGNOSIS — T148XXA Other injury of unspecified body region, initial encounter: Secondary | ICD-10-CM

## 2021-03-11 DIAGNOSIS — R972 Elevated prostate specific antigen [PSA]: Secondary | ICD-10-CM | POA: Diagnosis present

## 2021-03-11 DIAGNOSIS — M545 Low back pain, unspecified: Secondary | ICD-10-CM

## 2021-03-11 DIAGNOSIS — Z4889 Encounter for other specified surgical aftercare: Secondary | ICD-10-CM | POA: Diagnosis not present

## 2021-03-11 DIAGNOSIS — I129 Hypertensive chronic kidney disease with stage 1 through stage 4 chronic kidney disease, or unspecified chronic kidney disease: Secondary | ICD-10-CM | POA: Diagnosis present

## 2021-03-11 DIAGNOSIS — D630 Anemia in neoplastic disease: Secondary | ICD-10-CM | POA: Diagnosis present

## 2021-03-11 DIAGNOSIS — M4840XA Fatigue fracture of vertebra, site unspecified, initial encounter for fracture: Secondary | ICD-10-CM | POA: Diagnosis not present

## 2021-03-11 DIAGNOSIS — I4891 Unspecified atrial fibrillation: Secondary | ICD-10-CM | POA: Diagnosis present

## 2021-03-11 DIAGNOSIS — Z8249 Family history of ischemic heart disease and other diseases of the circulatory system: Secondary | ICD-10-CM | POA: Diagnosis not present

## 2021-03-11 DIAGNOSIS — D649 Anemia, unspecified: Secondary | ICD-10-CM | POA: Diagnosis not present

## 2021-03-11 DIAGNOSIS — Z79899 Other long term (current) drug therapy: Secondary | ICD-10-CM

## 2021-03-11 DIAGNOSIS — M549 Dorsalgia, unspecified: Secondary | ICD-10-CM | POA: Diagnosis present

## 2021-03-11 DIAGNOSIS — Z885 Allergy status to narcotic agent status: Secondary | ICD-10-CM | POA: Diagnosis not present

## 2021-03-11 DIAGNOSIS — R109 Unspecified abdominal pain: Secondary | ICD-10-CM | POA: Diagnosis not present

## 2021-03-11 DIAGNOSIS — S32020D Wedge compression fracture of second lumbar vertebra, subsequent encounter for fracture with routine healing: Secondary | ICD-10-CM | POA: Diagnosis not present

## 2021-03-11 DIAGNOSIS — S32020S Wedge compression fracture of second lumbar vertebra, sequela: Secondary | ICD-10-CM | POA: Diagnosis present

## 2021-03-11 DIAGNOSIS — Z515 Encounter for palliative care: Secondary | ICD-10-CM | POA: Insufficient documentation

## 2021-03-11 DIAGNOSIS — Z9841 Cataract extraction status, right eye: Secondary | ICD-10-CM

## 2021-03-11 LAB — CBC WITH DIFFERENTIAL/PLATELET
Abs Immature Granulocytes: 0.01 10*3/uL (ref 0.00–0.07)
Basophils Absolute: 0 10*3/uL (ref 0.0–0.1)
Basophils Relative: 0 %
Eosinophils Absolute: 0 10*3/uL (ref 0.0–0.5)
Eosinophils Relative: 1 %
HCT: 35.4 % — ABNORMAL LOW (ref 39.0–52.0)
Hemoglobin: 11.9 g/dL — ABNORMAL LOW (ref 13.0–17.0)
Immature Granulocytes: 0 %
Lymphocytes Relative: 32 %
Lymphs Abs: 1.3 10*3/uL (ref 0.7–4.0)
MCH: 31.1 pg (ref 26.0–34.0)
MCHC: 33.6 g/dL (ref 30.0–36.0)
MCV: 92.4 fL (ref 80.0–100.0)
Monocytes Absolute: 0.2 10*3/uL (ref 0.1–1.0)
Monocytes Relative: 4 %
Neutro Abs: 2.6 10*3/uL (ref 1.7–7.7)
Neutrophils Relative %: 63 %
Platelets: 244 10*3/uL (ref 150–400)
RBC: 3.83 MIL/uL — ABNORMAL LOW (ref 4.22–5.81)
RDW: 16.2 % — ABNORMAL HIGH (ref 11.5–15.5)
WBC: 4.2 10*3/uL (ref 4.0–10.5)
nRBC: 0 % (ref 0.0–0.2)

## 2021-03-11 LAB — COMPREHENSIVE METABOLIC PANEL
ALT: 8 U/L (ref 0–44)
AST: 23 U/L (ref 15–41)
Albumin: 4.1 g/dL (ref 3.5–5.0)
Alkaline Phosphatase: 149 U/L — ABNORMAL HIGH (ref 38–126)
Anion gap: 15 (ref 5–15)
BUN: 16 mg/dL (ref 8–23)
CO2: 20 mmol/L — ABNORMAL LOW (ref 22–32)
Calcium: 10 mg/dL (ref 8.9–10.3)
Chloride: 101 mmol/L (ref 98–111)
Creatinine, Ser: 1.48 mg/dL — ABNORMAL HIGH (ref 0.61–1.24)
GFR, Estimated: 46 mL/min — ABNORMAL LOW (ref >60)
Glucose, Bld: 131 mg/dL — ABNORMAL HIGH (ref 70–99)
Potassium: 3.7 mmol/L (ref 3.5–5.1)
Sodium: 136 mmol/L (ref 135–145)
Total Bilirubin: 1 mg/dL (ref 0.3–1.2)
Total Protein: 6.7 g/dL (ref 6.5–8.1)

## 2021-03-11 LAB — URINALYSIS, COMPLETE (UACMP) WITH MICROSCOPIC
Bacteria, UA: NONE SEEN
Bilirubin Urine: NEGATIVE
Glucose, UA: NEGATIVE mg/dL
Hgb urine dipstick: NEGATIVE
Ketones, ur: 5 mg/dL — AB
Nitrite: NEGATIVE
Protein, ur: 30 mg/dL — AB
Specific Gravity, Urine: 1.009 (ref 1.005–1.030)
Squamous Epithelial / HPF: NONE SEEN (ref 0–5)
pH: 9 — ABNORMAL HIGH (ref 5.0–8.0)

## 2021-03-11 LAB — RESP PANEL BY RT-PCR (FLU A&B, COVID) ARPGX2
Influenza A by PCR: NEGATIVE
Influenza B by PCR: NEGATIVE
SARS Coronavirus 2 by RT PCR: NEGATIVE

## 2021-03-11 LAB — PROTIME-INR
INR: 1.2 (ref 0.8–1.2)
Prothrombin Time: 15.4 seconds — ABNORMAL HIGH (ref 11.4–15.2)

## 2021-03-11 LAB — LIPASE, BLOOD: Lipase: 30 U/L (ref 11–51)

## 2021-03-11 LAB — BRAIN NATRIURETIC PEPTIDE: B Natriuretic Peptide: 87.3 pg/mL (ref 0.0–100.0)

## 2021-03-11 LAB — APTT: aPTT: 33 seconds (ref 24–36)

## 2021-03-11 MED ORDER — ATORVASTATIN CALCIUM 20 MG PO TABS
40.0000 mg | ORAL_TABLET | Freq: Every day | ORAL | Status: DC
  Administered 2021-03-11 – 2021-03-16 (×5): 40 mg via ORAL
  Filled 2021-03-11 (×5): qty 2

## 2021-03-11 MED ORDER — HYDROMORPHONE HCL 1 MG/ML IJ SOLN: 1.0000 mg | Freq: Once | INTRAMUSCULAR | Status: DC

## 2021-03-11 MED ORDER — BACLOFEN 10 MG PO TABS
10.0000 mg | ORAL_TABLET | Freq: Three times a day (TID) | ORAL | Status: DC
  Administered 2021-03-11 – 2021-03-17 (×16): 10 mg via ORAL
  Filled 2021-03-11 (×21): qty 1

## 2021-03-11 MED ORDER — POLYETHYLENE GLYCOL 3350 17 G PO PACK
17.0000 g | PACK | Freq: Every day | ORAL | Status: DC
Start: 2021-03-11 — End: 2021-03-17
  Administered 2021-03-11 – 2021-03-17 (×5): 17 g via ORAL
  Filled 2021-03-11 (×5): qty 1

## 2021-03-11 MED ORDER — CALCITONIN (SALMON) 200 UNIT/ACT NA SOLN
1.0000 | Freq: Every day | NASAL | Status: DC
  Administered 2021-03-11 – 2021-03-17 (×6): 1 via NASAL
  Filled 2021-03-11 (×2): qty 3.7

## 2021-03-11 MED ORDER — OXYCODONE-ACETAMINOPHEN 5-325 MG PO TABS
1.0000 | ORAL_TABLET | ORAL | Status: DC | PRN
  Administered 2021-03-11 – 2021-03-12 (×2): 1 via ORAL
  Administered 2021-03-12 – 2021-03-13 (×2): 2 via ORAL
  Filled 2021-03-11: qty 2
  Filled 2021-03-11 (×2): qty 1
  Filled 2021-03-11: qty 2

## 2021-03-11 MED ORDER — FENTANYL CITRATE (PF) 100 MCG/2ML IJ SOLN
12.5000 ug | INTRAMUSCULAR | Status: DC | PRN
  Administered 2021-03-11: 12.5 ug via INTRAVENOUS
  Filled 2021-03-11: qty 2

## 2021-03-11 MED ORDER — ACETAMINOPHEN 325 MG PO TABS
650.0000 mg | ORAL_TABLET | Freq: Four times a day (QID) | ORAL | Status: DC | PRN
  Administered 2021-03-11: 650 mg via ORAL
  Filled 2021-03-11: qty 2

## 2021-03-11 MED ORDER — IOHEXOL 300 MG/ML  SOLN
100.0000 mL | Freq: Once | INTRAMUSCULAR | Status: AC | PRN
  Administered 2021-03-11: 100 mL via INTRAVENOUS

## 2021-03-11 MED ORDER — MORPHINE SULFATE (PF) 4 MG/ML IV SOLN
4.0000 mg | Freq: Once | INTRAVENOUS | Status: AC
  Administered 2021-03-11: 4 mg via INTRAVENOUS
  Filled 2021-03-11 (×2): qty 1

## 2021-03-11 MED ORDER — ONDANSETRON HCL 4 MG/2ML IJ SOLN
4.0000 mg | Freq: Three times a day (TID) | INTRAMUSCULAR | Status: DC | PRN
  Administered 2021-03-11 – 2021-03-17 (×7): 4 mg via INTRAVENOUS
  Filled 2021-03-11 (×7): qty 2

## 2021-03-11 MED ORDER — CARVEDILOL 12.5 MG PO TABS: 12.5000 mg | ORAL_TABLET | Freq: Two times a day (BID) | ORAL | 3 refills | Status: DC

## 2021-03-11 MED ORDER — CEFAZOLIN SODIUM-DEXTROSE 2-4 GM/100ML-% IV SOLN
2.0000 g | INTRAVENOUS | Status: DC
  Filled 2021-03-11: qty 100

## 2021-03-11 MED ORDER — OXYCODONE-ACETAMINOPHEN 5-325 MG PO TABS: 1.0000 | ORAL_TABLET | ORAL | Status: DC | PRN

## 2021-03-11 MED ORDER — APIXABAN 2.5 MG PO TABS
2.5000 mg | ORAL_TABLET | Freq: Two times a day (BID) | ORAL | Status: DC
  Filled 2021-03-11: qty 1

## 2021-03-11 MED ORDER — GABAPENTIN 100 MG PO CAPS
100.0000 mg | ORAL_CAPSULE | Freq: Three times a day (TID) | ORAL | Status: DC
  Administered 2021-03-11 – 2021-03-17 (×16): 100 mg via ORAL
  Filled 2021-03-11 (×16): qty 1

## 2021-03-11 MED ORDER — POLYETHYLENE GLYCOL 3350 17 G PO PACK: 17.0000 g | PACK | Freq: Two times a day (BID) | ORAL | Status: DC | PRN

## 2021-03-11 MED ORDER — METOCLOPRAMIDE HCL 5 MG/ML IJ SOLN
10.0000 mg | Freq: Once | INTRAMUSCULAR | Status: AC
  Administered 2021-03-11: 10 mg via INTRAVENOUS
  Filled 2021-03-11: qty 2

## 2021-03-11 MED ORDER — CARVEDILOL 25 MG PO TABS
25.0000 mg | ORAL_TABLET | Freq: Two times a day (BID) | ORAL | Status: DC
  Administered 2021-03-11 – 2021-03-17 (×11): 25 mg via ORAL
  Filled 2021-03-11 (×11): qty 1

## 2021-03-11 MED ORDER — ONDANSETRON HCL 4 MG/2ML IJ SOLN
4.0000 mg | Freq: Once | INTRAMUSCULAR | Status: AC
  Administered 2021-03-11: 4 mg via INTRAVENOUS
  Filled 2021-03-11: qty 2

## 2021-03-11 MED ORDER — SENNA 8.6 MG PO TABS
1.0000 | ORAL_TABLET | Freq: Every day | ORAL | Status: DC
  Administered 2021-03-11 – 2021-03-14 (×3): 8.6 mg via ORAL
  Filled 2021-03-11 (×3): qty 1

## 2021-03-11 MED ORDER — HYDRALAZINE HCL 20 MG/ML IJ SOLN
10.0000 mg | Freq: Once | INTRAMUSCULAR | Status: DC
  Filled 2021-03-11: qty 1

## 2021-03-11 MED ORDER — HYDRALAZINE HCL 20 MG/ML IJ SOLN
5.0000 mg | INTRAMUSCULAR | Status: DC | PRN
  Administered 2021-03-11 – 2021-03-12 (×2): 5 mg via INTRAVENOUS
  Filled 2021-03-11 (×2): qty 1

## 2021-03-11 MED ORDER — MORPHINE SULFATE 2 MG/ML IJ SOLN
2.0000 mg | INTRAMUSCULAR | Status: DC | PRN
  Administered 2021-03-11 – 2021-03-12 (×3): 2 mg via INTRAVENOUS
  Administered 2021-03-12 – 2021-03-13 (×4): 4 mg via INTRAVENOUS
  Filled 2021-03-11: qty 2
  Filled 2021-03-11: qty 1
  Filled 2021-03-11 (×2): qty 2
  Filled 2021-03-11: qty 1
  Filled 2021-03-11 (×2): qty 2

## 2021-03-11 MED ORDER — SODIUM CHLORIDE 0.9 % IV BOLUS
1000.0000 mL | Freq: Once | INTRAVENOUS | Status: AC
  Administered 2021-03-11: 1000 mL via INTRAVENOUS

## 2021-03-11 NOTE — ED Triage Notes (Addendum)
Pt arrives via ems from home, c/o abdominal pain. Has relatively new cancer diagnosis and chronic low back and bilateral hip pain for several months, worse over the last few days. Pain reported to ems at 10/10, ems gave 235mcg of fentanyl in route. Pt rates pain 7/10 upon arrival to ED. Denies any recent falls, denies n/v/d. He does report "I haven't had a bowel movement in three days" and takes oxycodone and baclofen at home which also did not help relieve current pain.

## 2021-03-11 NOTE — ED Notes (Signed)
Place pt on 2L oxygen via nasal cannula, oxygen saturation dropped to 88% on room air. His wife states this happens every time he receives pain medication in the hospital. Oxygen saturation 98% on 2L.

## 2021-03-11 NOTE — ED Notes (Signed)
Messaged Dr Kerman Passey regarding nausea and vomiting, pt requests additional nausea meds.

## 2021-03-11 NOTE — H&P (Signed)
History and Physical    Jack [name] ACZ:[id number] DOB: [birth year] DOA: 03/11/2021  Referring MD/NP/PA:   PCP: Jack Pitch, MD   Patient coming from:  The patient is coming from home.      Chief Complaint: back pain and abdominal pain  HPI: Jack [name] is a 87 y.o. male with medical history significant of HTN, HLD, CKD-IV, GERD, BPH, anemia, a fib on eliquis, ureteral stone, compression fracture of L1 and T12-spine (s/p of kyphoplasty), who presents with back pain and abdominal pain.   Pt had hx of compression fracture of L1 and T12-spine. He underwent kyphoplasty procedures recently. He was found to have possible MGUS/MM. Pt is following up with Dr. Grayland [name]. Pt was seen by Dr. Celesta [name] on 6/28. Per Dr. Elna [name] note, patient will likely need bone marrow biopsy to confirm the diagnosis. Pt states that he has worsening back pain, mainly in lower back which is constant, sharp, severe, nonradiating.  No leg numbness or weakness.  No incontinence of urine or loss control of bowel movement.  Patient is currently taking Tylenol, Percocet, Neurontin and baclofen. His pain is not controlled.  Patient is constipated.  He also reports abdominal pain in bilateral sides of abdomen which he described as muscle tension.  Has nausea and dry heaves, no diarrhea.  No symptoms of UTI.  ED Course: pt was found to have WBC 4.2, pending COVID-19 PCR, renal function at baseline, temperature normal, blood pressure 188/108, heart rate 88, RR 29, oxygen saturation 96% on room air.  Patient is placed on MedSurg bed for observation.  CT-abdomen/pelvis: 1. Diffusely abnormal bone mineralization in keeping with MGUS/Multiple Myeloma with new or progressed pathologic compression fractures of L2 and L3 since last month. Interval augmentation of T12 and L1.   2. No other acute finding in the abdomen or pelvis. Small to moderate hiatal hernia. Prostatomegaly. Aortic Atherosclerosis  (ICD10-I70.0).   Review of Systems:   General: no fevers, chills, no body weight gain, has fatigue HEENT: no blurry vision, hearing changes or sore throat Respiratory: no dyspnea, coughing, wheezing CV: no chest pain, no palpitations GI: has nausea, vomiting, abdominal pain, no diarrhea, has constipation GU: no dysuria, burning on urination, increased urinary frequency, hematuria  Ext: no leg edema Neuro: no unilateral weakness, numbness, or tingling, no vision change or hearing loss Skin: no rash, no skin tear. MSK: has back pain Heme: No easy bruising.  Travel history: No recent long distant travel.  Allergy:  Allergies  Allergen Reactions   Quinolones     Aortic dilation contraindicates FQ   Amlodipine Other (See Comments)    LE edema   Azithromycin Nausea And Vomiting   Lisinopril Cough   Morphine And Related Nausea And Vomiting    Past Medical History:  Diagnosis Date   Elevated prostate specific antigen (PSA)    has been 7 for a year    GERD (gastroesophageal reflux disease)    History of colon polyps 2008   [facility],    History of kidney stones    Hyperlipidemia    Hypertension    Prostate hypertrophy    diagnosed at age 40 due to hematospermia   Renal disorder     Past Surgical History:  Procedure Laterality Date   CATARACT EXTRACTION W/PHACO Left 01/10/2018   Procedure: CATARACT EXTRACTION PHACO AND INTRAOCULAR LENS PLACEMENT ([facility]);  Surgeon: Jack Robson, MD;  Location: [facility] OR[facility];  Service: Ophthalmology;  Laterality: Left;  [name] 00:24.8 AP% 14.9 CDE 3.68 Fluid  pack lot # [id number]   CATARACT EXTRACTION W/PHACO Right 01/25/2018   Procedure: CATARACT EXTRACTION PHACO AND INTRAOCULAR LENS PLACEMENT ([facility]);  Surgeon: Jack Robson, MD;  Location: [facility];  Service: Ophthalmology;  Laterality: Right;  [name] 00:42 AP% 10.8 CDE 4.59 Fluid pack lot # [id number]   COLON SURGERY     CYSTOSCOPY W/ URETERAL STENT PLACEMENT Right 10/16/2017   Procedure:  right  URETERAL STENT PLACEMENT,cystoscopy bilateral stent removal,rretrograde;  Surgeon: Jack Sons, MD;  Location: [facility];  Service: Urology;  Laterality: Right;   CYSTOSCOPY/URETEROSCOPY/HOLMIUM LASER/STENT PLACEMENT Right 12/16/2020   Procedure: CYSTOSCOPY/URETEROSCOPY/HOLMIUM LASER/STENT PLACEMENT;  Surgeon: Jack Sons, MD;  Location: [facility];  Service: Urology;  Laterality: Right;   EXTRACORPOREAL SHOCK WAVE LITHOTRIPSY Right 12/11/2020   Procedure: EXTRACORPOREAL SHOCK WAVE LITHOTRIPSY (ESWL);  Surgeon: Jack Sons, MD;  Location: [facility];  Service: Urology;  Laterality: Right;   IR KYPHO EA ADDL LEVEL THORACIC OR LUMBAR  02/02/2021   IR KYPHO LUMBAR INC FX REDUCE BONE BX UNI/BIL CANNULATION INC/IMAGING  02/02/2021   KIDNEY STONE SURGERY     RESECTION SOFT TISSUE TUMOR LEG / ANKLE RADICAL  jan 2009   [facility],  right thigh/knee , nonmalignant   SMALL INTESTINE SURGERY  1946   implaed on picket fence, punctured stomach   TONSILLECTOMY      Social History:  reports that he quit smoking about 55 years ago. He has never used smokeless tobacco. He reports current alcohol use of about 1.0 standard drink of alcohol per week. He reports that he does not use drugs.  Family History:  Family History  Problem Relation Age of Onset   Hypertension Father    Hyperlipidemia Father    Cancer Sister        thyroid - dx in late 20's     Prior to Admission medications   Medication Sig Start Date End Date Taking? Authorizing Provider  apixaban (ELIQUIS) 2.5 MG TABS tablet Take 1 tablet (2.5 mg total) by mouth 2 (two) times daily. 02/07/21   Jack [place] Jack Poag, DO  atorvastatin (LIPITOR) 40 MG tablet Take 1 tablet (40 mg total) by mouth daily at 6 PM. 02/24/21 02/24/22  Jack [name] D, PA-C  calcitonin, salmon, (MIACALCIN/FORTICAL) 200 UNIT/ACT nasal spray Place 1 spray into alternate nostrils daily. 02/07/21   Jack [place] Jack Poag, DO  carvedilol (COREG) 12.5 MG tablet Take 1 tablet (12.5 mg  total) by mouth 2 (two) times daily with a meal. 03/10/21 03/05/22  Borders, Jack Boys, NP  docusate sodium (COLACE) 100 MG capsule Take 1 capsule (100 mg total) by mouth 2 (two) times daily. 01/07/21   Loletha Grayer, MD  gabapentin (NEURONTIN) 100 MG capsule Take 100 mg by mouth 3 (three) times daily.    [provider]  ondansetron (ZOFRAN) 8 MG tablet Take 1 tablet (8 mg total) by mouth every 8 (eight) hours as needed for nausea or vomiting. 03/10/21   Borders, Jack Boys, NP  oxyCODONE-acetaminophen (PERCOCET/ROXICET) 5-325 MG tablet Take 1 tablet by mouth every 6 (six) hours as needed for severe pain. 03/10/21   Borders, Jack Boys, NP  tamsulosin (FLOMAX) 0.4 MG CAPS capsule Take 1 capsule (0.4 mg total) by mouth daily. 12/15/20   Nori Riis, PA-C    Physical Exam: Vitals:   03/11/21 1519 03/11/21 1530 03/11/21 1716 03/11/21 1800  BP: (!) 154/99 (!) 175/89 (!) 163/98 (!) 143/80  Pulse: 84 83 93 87  Resp: (!) '25 16  16  ' Temp:  SpO2: 96% 97%  96%  Weight:      Height:       General: Not in acute distress HEENT:       Eyes: PERRL, EOMI, no scleral icterus.       ENT: No discharge from the ears and nose, no pharynx injection, no tonsillar enlargement.        Neck: No JVD, no bruit, no mass felt. Heme: No neck lymph node enlargement. Cardiac: S1/S2, RRR, No murmurs, No gallops or rubs. Respiratory: No rales, wheezing, rhonchi or rubs. GI: Soft, nondistended, nontender, no rebound pain, no organomegaly, BS present. GU: No hematuria Ext: No pitting leg edema bilaterally. 1+DP/PT pulse bilaterally. Musculoskeletal: has tenderness in lower back  Skin: No rashes.  Neuro: Alert, oriented X3, cranial nerves II-XII grossly intact, moves all extremities normally  Psych: Patient is not psychotic, no suicidal or hemocidal ideation.  Labs on Admission: I have personally reviewed following labs and imaging studies  CBC: Recent Labs  Lab 03/11/21 0829  WBC 4.2  NEUTROABS 2.6   HGB 11.9*  HCT 35.4*  MCV 92.4  PLT 299   Basic Metabolic Panel: Recent Labs  Lab 03/11/21 0829  NA 136  K 3.7  CL 101  CO2 20*  GLUCOSE 131*  BUN 16  CREATININE 1.48*  CALCIUM 10.0   GFR: Estimated Creatinine Clearance: 36.5 mL/min (A) (by C-G formula based on SCr of 1.48 mg/dL (H)). Liver Function Tests: Recent Labs  Lab 03/11/21 0829  AST 23  ALT 8  ALKPHOS 149*  BILITOT 1.0  PROT 6.7  ALBUMIN 4.1   Recent Labs  Lab 03/11/21 0829  LIPASE 30   No results for input(s): AMMONIA in the last 168 hours. Coagulation Profile: No results for input(s): INR, PROTIME in the last 168 hours. Cardiac Enzymes: No results for input(s): CKTOTAL, CKMB, CKMBINDEX, TROPONINI in the last 168 hours. BNP (last 3 results) No results for input(s): PROBNP in the last 8760 hours. HbA1C: No results for input(s): HGBA1C in the last 72 hours. CBG: No results for input(s): GLUCAP in the last 168 hours. Lipid Profile: No results for input(s): CHOL, HDL, LDLCALC, TRIG, CHOLHDL, LDLDIRECT in the last 72 hours. Thyroid Function Tests: No results for input(s): TSH, T4TOTAL, FREET4, T3FREE, THYROIDAB in the last 72 hours. Anemia Panel: No results for input(s): VITAMINB12, FOLATE, FERRITIN, TIBC, IRON, RETICCTPCT in the last 72 hours. Urine analysis:    Component Value Date/Time   COLORURINE STRAW (A) 03/11/2021 0838   APPEARANCEUR CLEAR (A) 03/11/2021 0838   APPEARANCEUR Clear 12/25/2020 1037   LABSPEC 1.009 03/11/2021 0838   PHURINE 9.0 (H) 03/11/2021 0838   GLUCOSEU NEGATIVE 03/11/2021 0838   HGBUR NEGATIVE 03/11/2021 0838   BILIRUBINUR NEGATIVE 03/11/2021 0838   BILIRUBINUR Negative 12/25/2020 1037   KETONESUR 5 (A) 03/11/2021 0838   PROTEINUR 30 (A) 03/11/2021 0838   NITRITE NEGATIVE 03/11/2021 0838   LEUKOCYTESUR TRACE (A) 03/11/2021 0838   Sepsis Labs: '@LABRCNTIP' (procalcitonin:4,lacticidven:4) ) Recent Results (from the past 240 hour(s))  Resp Panel by RT-PCR (Flu A&B,  Covid) Nasopharyngeal Swab     Status: None   Collection Time: 03/11/21 11:36 AM   Specimen: Nasopharyngeal Swab; Nasopharyngeal(NP) swabs in vial transport medium  Result Value Ref Range Status   SARS Coronavirus 2 by RT PCR NEGATIVE NEGATIVE Final    Comment: (NOTE) SARS-CoV-2 target nucleic acids are NOT DETECTED.  The SARS-CoV-2 RNA is generally detectable in upper respiratory specimens during the acute phase of infection. The lowest concentration  of SARS-CoV-2 viral copies this assay can detect is 138 copies/mL. A negative result does not preclude SARS-Cov-2 infection and should not be used as the sole basis for treatment or other patient management decisions. A negative result may occur with  improper specimen collection/handling, submission of specimen other than nasopharyngeal swab, presence of viral mutation(s) within the areas targeted by this assay, and inadequate number of viral copies(<138 copies/mL). A negative result must be combined with clinical observations, patient history, and epidemiological information. The expected result is Negative.  Fact Sheet for Patients:  EntrepreneurPulse.com.au  Fact Sheet for Healthcare Providers:  IncredibleEmployment.be  This test is no t yet approved or cleared by the [place] FDA and  has been authorized for detection and/or diagnosis of SARS-CoV-2 by FDA under an Emergency Use Authorization (EUA). This EUA will remain  in effect (meaning this test can be used) for the duration of the COVID-19 declaration under Section 564(b)(1) of the Act, 21 U.S.C.section 360bbb-3(b)(1), unless the authorization is terminated  or revoked sooner.       Influenza A by PCR NEGATIVE NEGATIVE Final   Influenza B by PCR NEGATIVE NEGATIVE Final    Comment: (NOTE) The Xpert Xpress SARS-CoV-2/FLU/RSV plus assay is intended as an aid in the diagnosis of influenza from Nasopharyngeal swab specimens and should  not be used as a sole basis for treatment. Nasal washings and aspirates are unacceptable for Xpert Xpress SARS-CoV-2/FLU/RSV testing.  Fact Sheet for Patients: EntrepreneurPulse.com.au  Fact Sheet for Healthcare Providers: IncredibleEmployment.be  This test is not yet approved or cleared by the [place] FDA and has been authorized for detection and/or diagnosis of SARS-CoV-2 by FDA under an Emergency Use Authorization (EUA). This EUA will remain in effect (meaning this test can be used) for the duration of the COVID-19 declaration under Section 564(b)(1) of the Act, 21 U.S.C. section 360bbb-3(b)(1), unless the authorization is terminated or revoked.  Performed at  [facility], [address] [place], [place] [address]      Radiological Exams on Admission: CT ABDOMEN PELVIS W CONTRAST  Result Date: 03/11/2021 CLINICAL DATA:  87 year old male with abdominal pain. Recent diagnosis of MGUS, concern for multiple myeloma. EXAM: CT ABDOMEN AND PELVIS WITH CONTRAST TECHNIQUE: Multidetector CT imaging of the abdomen and pelvis was performed using the standard protocol following bolus administration of intravenous contrast. CONTRAST:  111m OMNIPAQUE IOHEXOL 300 MG/ML  SOLN COMPARISON:  Noncontrast CT Abdomen and Pelvis 01/29/2021 and earlier. Thoracic and lumbar MRI 01/29/2021. FINDINGS: Lower chest: Stable small to moderate hiatal hernia. Tortuous thoracic aorta. Calcified coronary artery atherosclerosis. No cardiomegaly or pericardial effusion. No pleural effusion. Stable mild lung base atelectasis or scarring. Hepatobiliary: Gallbladder is at the upper limits of normal but does not appear inflamed. Negative liver. No bile duct enlargement. Pancreas: Negative. Spleen: Negative. Adrenals/Urinary Tract: Normal adrenal glands. Nonobstructed kidneys with symmetric renal enhancement and contrast excretion. There is probable small benign left renal  parapelvic cyst accounting for the increased left renal hilum soft tissue on series 7, image 26, stable since 2018. Decompressed ureters. Decompressed and negative bladder. Stomach/Bowel: Negative large bowel aside from retained stool in the right colon. Diminutive, negative appendix on coronal image 48. Flocculated material in the terminal ileum but no dilated small bowel. Aside from hiatal hernia the stomach is negative. Negative duodenum. No free air or free fluid. Vascular/Lymphatic: Aortoiliac calcified atherosclerosis. Major arterial structures remain patent in the abdomen and pelvis. Tortuous iliac arteries. No lymphadenopathy. Reproductive: Prostatomegaly. Previous left inguinal hernia repair  with mesh appears stable. Otherwise negative. Other: No pelvic free fluid. Musculoskeletal: Diffuse osteopenia and heterogeneous bone mineralization similar to the CT last month, distinctly different from a 2018 CT. Interval augmentation of the T12 and L1 compression fractures. New/progressed compression fractures of L2 and L3 since last month. No significant retropulsion of bone or other complicating features. Bone mineralization is especially heterogeneous in the lumbar spine and pelvis, and these are likely pathologic fractures in the setting of MGUS/myeloma. No other acute osseous abnormality identified. IMPRESSION: 1. Diffusely abnormal bone mineralization in keeping with MGUS/Multiple Myeloma with new or progressed pathologic compression fractures of L2 and L3 since last month. Interval augmentation of T12 and L1. 2. No other acute finding in the abdomen or pelvis. Small to moderate hiatal hernia. Prostatomegaly. Aortic Atherosclerosis (ICD10-I70.0). Electronically Signed   By: Genevie Ann M.D.   On: 03/11/2021 09:53     EKG: I have personally reviewed.  Sinus rhythm, QTC 482, LAD, poor R wave progression, poor quality of EKG strips  Assessment/Plan Principal Problem:   Back pain Active Problems:   Essential  hypertension   Abdominal pain   Normocytic anemia   Hyperplasia, prostate   Hyperlipidemia   CKD (chronic kidney disease), stage IV (HCC)   Constipation   Atrial fibrillation (HCC)   Compression fracture of L2 and L3   Back pain due to compression fracture of L2 and L3: consulted Dr. Izora Ribas of neurosurgery. He recommended for kyphoplasty.  Dr. Izora Ribas communicated with Dr. Rudene Christians of orthopedic surgery. Per Dr. Rudene Christians, will do kyphoplasty procedure tomorrow, and at the same time he will get biopsy.  -Admitted to [facility] bed as inpatient -As needed Percocet, fentanyl, Tylenol for pain -Continue baclofen -INR/PTT -Hold Eliquis  Normocytic anemia and possible multiple myeloma -Consulted Dr. Grayland [name] of oncology -f/u bone biopsy during kyphoplasty procedure  Essential hypertension -IV hydralazine as needed -Coreg,  Prostate hypertrophy -Flomax  Abdominal pain: Patient does not seem to have true abdominal pain.  He described as muscle tension on both side of abdomen.  CT scan of abdomen/pelvis is not impressive. -Observe closely  Hyperlipidemia -Lipitor  CKD (chronic kidney disease), stage IV ([place]): Renal function is at baseline.  Recent creatinine 1.9 on 02/24/2021.  His creatinine is 1.48, BUN 1630. -Follow-up with  BMP  Constipation -MiraLAX and senna code  Atrial fibrillation (HCC) -Hold Eliquis -Continue Coreg    DVT ppx: SCD Code Status: Full code, discussed with pt. Family Communication: Yes, patient's  wife at bed side Disposition Plan:  Anticipate discharge back to previous environment Consults called:  Dr. Grayland [name] of oncology, Dr. Rudene Christians of ortho, Dr. Izora Ribas of neurosugery Admission status and Level of care: Med-Surg:    for obs    Status is: Observation  The patient remains OBS appropriate and will d/c before 2 midnights.  Dispo: The patient is from: Home              Anticipated d/c is to: Home              Patient currently is not medically  stable to d/c.   Difficult to place patient No          Date of Service 03/11/2021    Ivor Costa [facility]   If 7PM-7AM, please contact night-coverage [email or web address] 03/11/2021, 6:50 PM

## 2021-03-11 NOTE — ED Notes (Signed)
Messaged Dr Kerman Passey per pt/family request, pt's family states he cannot take dilaudid due to previous reaction where he experienced "oxygen rate in the 50's-60's". Pt also c/o nausea, requested nausea medication per his request. Awaiting new orders.

## 2021-03-11 NOTE — Consult Note (Signed)
Referring Physician:  No referring provider defined for this encounter.  Primary Physician:  [name], MD  Chief Complaint:  back pain  History of Present Illness: 03/11/2021 Jack [name] is a 87 y.o. male who presents with the chief complaint of back pain for several days.  He was previously seen by my partner Dr. Lacinda Axon with compression fractures at T12 and L1.  He had kyphoplasty last month, which was helpful.    Over the past few days, his pain has worsened in his lower back.  It was intolerable today, which prompted ER visit.  No new neurological symptoms.  He has a brace, but has not worn it for the past couple of days.  Review of Systems:  A 10 point review of systems is negative, except for the pertinent positives and negatives detailed in the HPI.  Past Medical History: Past Medical History:  Diagnosis Date   Elevated prostate specific antigen (PSA)    has been 7 for a year    GERD (gastroesophageal reflux disease)    History of colon polyps 2008   [facility],    History of kidney stones    Hyperlipidemia    Hypertension    Prostate hypertrophy    diagnosed at age 58 due to hematospermia   Renal disorder     Past Surgical History: Past Surgical History:  Procedure Laterality Date   CATARACT EXTRACTION W/PHACO Left 01/10/2018   Procedure: CATARACT EXTRACTION PHACO AND INTRAOCULAR LENS PLACEMENT ([place]);  Surgeon: Birder Robson, MD;  Location: [facility] ORS;  Service: Ophthalmology;  Laterality: Left;  [name] 00:24.8 AP% 14.9 CDE 3.68 Fluid pack lot # [id number]   CATARACT EXTRACTION W/PHACO Right 01/25/2018   Procedure: CATARACT EXTRACTION PHACO AND INTRAOCULAR LENS PLACEMENT (I[facility]);  Surgeon: Birder Robson, MD;  Location: [facility] ORS;  Service: Ophthalmology;  Laterality: Right;  [name] 00:42 AP% 10.8 CDE 4.59 Fluid pack lot # [id number]   COLON SURGERY     CYSTOSCOPY W/ URETERAL STENT PLACEMENT Right 10/16/2017   Procedure: right  URETERAL STENT  PLACEMENT,cystoscopy bilateral stent removal,rretrograde;  Surgeon: Abbie Sons, MD;  Location: [facility] ORS;  Service: Urology;  Laterality: Right;   CYSTOSCOPY/URETEROSCOPY/HOLMIUM LASER/STENT PLACEMENT Right 12/16/2020   Procedure: CYSTOSCOPY/URETEROSCOPY/HOLMIUM LASER/STENT PLACEMENT;  Surgeon: Abbie Sons, MD;  Location: [facility] ORS;  Service: Urology;  Laterality: Right;   EXTRACORPOREAL SHOCK WAVE LITHOTRIPSY Right 12/11/2020   Procedure: EXTRACORPOREAL SHOCK WAVE LITHOTRIPSY (ESWL);  Surgeon: Abbie Sons, MD;  Location: [facility] ORS;  Service: Urology;  Laterality: Right;   IR KYPHO EA ADDL LEVEL THORACIC OR LUMBAR  02/02/2021   IR KYPHO LUMBAR INC FX REDUCE BONE BX UNI/BIL CANNULATION INC/IMAGING  02/02/2021   KIDNEY STONE SURGERY     RESECTION SOFT TISSUE TUMOR LEG / ANKLE RADICAL  jan 2009   [facility],  right thigh/knee , nonmalignant   SMALL INTESTINE SURGERY  1946   implaed on picket fence, punctured stomach   TONSILLECTOMY      Allergies: Allergies as of 03/11/2021 - Review Complete 03/11/2021  Allergen Reaction Noted   Quinolones  02/24/2021   Amlodipine Other (See Comments) 09/24/2013   Azithromycin Nausea And Vomiting 01/05/2013   Lisinopril Cough 09/24/2013   Morphine and related Nausea And Vomiting 03/11/2021    Medications:  Current Facility-Administered Medications:    acetaminophen (TYLENOL) tablet 650 mg, 650 mg, Oral, Q6H PRN, Ivor Costa, MD   fentaNYL (SUBLIMAZE) injection 12.5 mcg, 12.5 mcg, Intravenous, Q2H PRN, Ivor Costa, MD   hydrALAZINE (APRESOLINE)  injection 10 mg, 10 mg, Intravenous, Once, Ivor Costa, MD   hydrALAZINE (APRESOLINE) injection 5 mg, 5 mg, Intravenous, Q2H PRN, Ivor Costa, MD   ondansetron [facility]) injection 4 mg, 4 mg, Intravenous, Q8H PRN, Ivor Costa, MD   oxyCODONE-acetaminophen (PERCOCET/ROXICET) 5-325 MG per tablet 1 tablet, 1 tablet, Oral, Q4H PRN, Ivor Costa, MD   polyethylene glycol (MIRALAX / GLYCOLAX) packet 17 g, 17 g, Oral, BID  PRN, Ivor Costa, MD   senna (SENOKOT) tablet 8.6 mg, 1 tablet, Oral, Daily, Ivor Costa, MD, 8.6 mg at 03/11/21 1246  Current Outpatient Medications:    acetaminophen (TYLENOL) 325 MG tablet, Take 650 mg by mouth every 8 (eight) hours as needed for mild pain or moderate pain., Disp: , Rfl:    apixaban (ELIQUIS) 2.5 MG TABS tablet, Take 1 tablet (2.5 mg total) by mouth 2 (two) times daily., Disp: 60 tablet, Rfl:    atorvastatin (LIPITOR) 40 MG tablet, Take 1 tablet (40 mg total) by mouth daily at 6 PM., Disp: 90 tablet, Rfl: 3   baclofen (LIORESAL) 10 MG tablet, Take 10 mg by mouth 3 (three) times daily., Disp: , Rfl:    calcitonin, salmon, (MIACALCIN/FORTICAL) 200 UNIT/ACT nasal spray, Place 1 spray into alternate nostrils daily., Disp: 3.7 mL, Rfl: 12   gabapentin (NEURONTIN) 100 MG capsule, Take 100 mg by mouth 3 (three) times daily., Disp: , Rfl:    ondansetron (ZOFRAN) 8 MG tablet, Take 1 tablet (8 mg total) by mouth every 8 (eight) hours as needed for nausea or vomiting., Disp: 45 tablet, Rfl: 0   oxyCODONE-acetaminophen (PERCOCET/ROXICET) 5-325 MG tablet, Take 1 tablet by mouth every 6 (six) hours as needed for severe pain., Disp: 60 tablet, Rfl: 0   senna (SENOKOT) 8.6 MG TABS tablet, Take 2 tablets by mouth at bedtime., Disp: , Rfl:    carvedilol (COREG) 12.5 MG tablet, Take 1 tablet (12.5 mg total) by mouth 2 (two) times daily with a meal., Disp: 180 tablet, Rfl: 3   docusate sodium (COLACE) 100 MG capsule, Take 1 capsule (100 mg total) by mouth 2 (two) times daily. (Patient not taking: Reported on 03/11/2021), Disp: 60 capsule, Rfl: 0   tamsulosin (FLOMAX) 0.4 MG CAPS capsule, Take 1 capsule (0.4 mg total) by mouth daily. (Patient not taking: Reported on 03/11/2021), Disp: 30 capsule, Rfl: 3   Social History: Social History   Tobacco Use   Smoking status: Former    Pack years: 0.00    Types: Cigarettes    Quit date: 07/05/1965    Years since quitting: 55.7   Smokeless tobacco: Never   Vaping Use   Vaping Use: Never used  Substance Use Topics   Alcohol use: Yes    Alcohol/week: 1.0 standard drink    Types: 1 Standard drinks or equivalent per week    Comment: occassionaly   Drug use: No    Family Medical History: Family History  Problem Relation Age of Onset   Hypertension Father    Hyperlipidemia Father    Cancer Sister        thyroid - dx in late 20's    Physical Examination: Vitals:   03/11/21 1249 03/11/21 1330  BP: (!) 156/114 (!) 173/105  Pulse:  81  Resp:  17  Temp:    SpO2:  96%     General: Patient is well developed, well nourished, calm, collected, and in no apparent distress.  Psychiatric: Patient is non-anxious.  Head:  Pupils equal, round, and reactive to light.  ENT:  Oral mucosa appears well hydrated.  Neck:   Supple.  Full range of motion.  Respiratory: Patient is breathing without any difficulty.  Extremities: No edema.  Vascular: Palpable pulses in dorsal pedal vessels.  Skin:   On exposed skin, there are no abnormal skin lesions.  NEUROLOGICAL:  General: In no acute distress.   Awake, alert, oriented to person, place, and time.  Pupils equal round and reactive to light.  Facial tone is symmetric.  Tongue protrusion is midline.  There is no pronator drift.  ROM of spine: untested Strength: Side Biceps Triceps Deltoid Interossei Grip Wrist Ext. Wrist Flex.  R _0 L _1 Side Iliopsoas Quads Hamstring PF DF EHL  R _2 L _3 Bilateral upper and lower extremity sensation is intact to light touch. Reflexes are 1+ and symmetric at the biceps, triceps, brachioradialis, patella and achilles. Hoffman's is absent.  Clonus is not present.  Toes are down-going.    Gait is untested due to pain.  Imaging: CT CAP 03/11/21 IMPRESSION: 1. Diffusely abnormal bone mineralization in keeping with MGUS/Multiple Myeloma with new or progressed pathologic compression fractures of L2 and L3  since last month. Interval augmentation of T12 and L1.   2. No other acute finding in the abdomen or pelvis. Small to moderate hiatal hernia. Prostatomegaly. Aortic Atherosclerosis (ICD10-I70.0).     Electronically Signed   By: Genevie Ann M.D.   On: 03/11/2021 09:53  I have personally reviewed the images and agree with the above interpretation.  Labs: CBC Latest Ref Rng & Units 03/11/2021 02/24/2021 02/19/2021  WBC 4.0 - 10.5 K/uL 4.2 3.2(L) 3.6(L)  Hemoglobin 13.0 - 17.0 g/dL 11.9(L) 11.0(L) 11.2(L)  Hematocrit 39.0 - 52.0 % 35.4(L) 35.3(L) 34.2(L)  Platelets 150 - 400 K/uL 244 249 292       Assessment and Plan: Mr. Sellin is a pleasant 87 y.o. male with L2 and L3 compression fractures new since May 2022.  Given his underlying myeloma, I think he is high risk of failure without kyphoplasty.    I recommend considering kyphoplasty and continuing his brace.  I will communicate with Dr. Rudene Christians to see if he is willing to perform kypho during this admission.   Lorane Cousar K. Izora Ribas MD, [place] Dept. of Neurosurgery

## 2021-03-11 NOTE — Consult Note (Addendum)
[place]  [phone] [phone]   Name: Jack [name] Date: 03/11/2021 MRN: [id number]  DOB: [birth year]  Patient Care Team: [name], MD as PCP - General (Family Medicine) End, [name], MD as PCP - Cardiology (Cardiology) [name], MD (Endocrinology)    REASON FOR CONSULTATION: Jack [name] is a 87 y.o. male with multiple medical problems including hypertension, CKD stage IIIb, A. fib on Eliquis, and anemia.  Patient was hospitalized 01/02/2021 to 01/07/2021 with T12 compression fracture with recommendation for conservative management including use of a TLSO brace.  Patient was hospitalized again 01/29/2021 to 02/07/2021 with recurrent back pain and MR showing T12 and new L1 compression fracture.  Patient underwent T12/L1 kyphoplasty on 02/02/2021.  There was also suspicion of a left fifth rib fracture, which occurred while transitioning from the stretcher for his kyphoplasty.  Patient had mild hypercalcemia with SPEP and UPEP concerning for possible myeloma.  Patient was referred to [facility] for work-up.  He is now readmitted 03/03/2021 with worsening back pain.  CT revealed new compression fractures of L2 and L3.  Palliative care was consulted to help address goals and manage ongoing symptoms..   SOCIAL HISTORY:     reports that he quit smoking about 55 years ago. He has never used smokeless tobacco. He reports current alcohol use of about 1.0 standard drink of alcohol per week. He reports that he does not use drugs.  Patient lives at [facility] in [place] with his wife of 19 years.  Patient has a son and daughter who are also involved in his care.  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  ADVANCE DIRECTIVES:  Has ACP documents but none on file  CODE STATUS: Full code  PAST MEDICAL HISTORY: Past Medical History:  Diagnosis Date   Elevated  prostate specific antigen (PSA)    has been 7 for a year    GERD (gastroesophageal reflux disease)    History of colon polyps 2008   [facility],    History of kidney stones    Hyperlipidemia    Hypertension    Prostate hypertrophy    diagnosed at age 73 due to hematospermia   Renal disorder     PAST SURGICAL HISTORY:  Past Surgical History:  Procedure Laterality Date   CATARACT EXTRACTION W/PHACO Left 01/10/2018   Procedure: CATARACT EXTRACTION PHACO AND INTRAOCULAR LENS PLACEMENT ([place]);  Surgeon: Birder Robson, MD;  Location: [facility] ORS;  Service: Ophthalmology;  Laterality: Left;  [name] 00:24.8 AP% 14.9 CDE 3.68 Fluid pack lot # [id number]   CATARACT EXTRACTION W/PHACO Right 01/25/2018   Procedure: CATARACT EXTRACTION PHACO AND INTRAOCULAR LENS PLACEMENT (I[facility]);  Surgeon: Birder Robson, MD;  Location: [facility] ORS;  Service: Ophthalmology;  Laterality: Right;  [name] 00:42 AP% 10.8 CDE 4.59 Fluid pack lot # [id number]   COLON SURGERY     CYSTOSCOPY W/ URETERAL STENT PLACEMENT Right 10/16/2017   Procedure: right  URETERAL STENT PLACEMENT,cystoscopy bilateral stent removal,rretrograde;  Surgeon: Abbie Sons, MD;  Location: [facility] ORS;  Service: Urology;  Laterality: Right;   CYSTOSCOPY/URETEROSCOPY/HOLMIUM LASER/STENT PLACEMENT Right 12/16/2020   Procedure: CYSTOSCOPY/URETEROSCOPY/HOLMIUM LASER/STENT PLACEMENT;  Surgeon: Abbie Sons, MD;  Location: [facility] ORS;  Service: Urology;  Laterality: Right;   EXTRACORPOREAL SHOCK WAVE LITHOTRIPSY Right 12/11/2020   Procedure: EXTRACORPOREAL SHOCK WAVE LITHOTRIPSY (ESWL);  Surgeon: Abbie Sons, MD;  Location: [facility] ORS;  Service: Urology;  Laterality: Right;  IR KYPHO EA ADDL LEVEL THORACIC OR LUMBAR  02/02/2021   IR KYPHO LUMBAR INC FX REDUCE BONE BX UNI/BIL CANNULATION INC/IMAGING  02/02/2021   KIDNEY STONE SURGERY     RESECTION SOFT TISSUE TUMOR LEG / ANKLE RADICAL  jan 2009   [facility],  right thigh/knee , nonmalignant   SMALL INTESTINE  SURGERY  1946   implaed on picket fence, punctured stomach   TONSILLECTOMY      HEMATOLOGY/ONCOLOGY HISTORY:  Oncology History   No history exists.    ALLERGIES:  is allergic to quinolones, amlodipine, azithromycin, lisinopril, and morphine and related.  MEDICATIONS:  Current Facility-Administered Medications  Medication Dose Route Frequency Provider Last Rate Last Admin   acetaminophen (TYLENOL) tablet 650 mg  650 mg Oral Q6H PRN Ivor Costa, MD       fentaNYL (SUBLIMAZE) injection 12.5 mcg  12.5 mcg Intravenous Q2H PRN Ivor Costa, MD       hydrALAZINE (APRESOLINE) injection 10 mg  10 mg Intravenous Once Ivor Costa, MD       hydrALAZINE (APRESOLINE) injection 5 mg  5 mg Intravenous Q2H PRN Ivor Costa, MD       ondansetron [facility]) injection 4 mg  4 mg Intravenous Q8H PRN Ivor Costa, MD       oxyCODONE-acetaminophen (PERCOCET/ROXICET) 5-325 MG per tablet 1 tablet  1 tablet Oral Q4H PRN Ivor Costa, MD       polyethylene glycol (MIRALAX / GLYCOLAX) packet 17 g  17 g Oral BID PRN Ivor Costa, MD       senna [facility]) tablet 8.6 mg  1 tablet Oral Daily Ivor Costa, MD   8.6 mg at 03/11/21 1246   Current Outpatient Medications  Medication Sig Dispense Refill   acetaminophen (TYLENOL) 325 MG tablet Take 650 mg by mouth every 8 (eight) hours as needed for mild pain or moderate pain.     apixaban (ELIQUIS) 2.5 MG TABS tablet Take 1 tablet (2.5 mg total) by mouth 2 (two) times daily. 60 tablet    atorvastatin (LIPITOR) 40 MG tablet Take 1 tablet (40 mg total) by mouth daily at 6 PM. 90 tablet 3   baclofen (LIORESAL) 10 MG tablet Take 10 mg by mouth 3 (three) times daily.     calcitonin, salmon, (MIACALCIN/FORTICAL) 200 UNIT/ACT nasal spray Place 1 spray into alternate nostrils daily. 3.7 mL 12   gabapentin (NEURONTIN) 100 MG capsule Take 100 mg by mouth 3 (three) times daily.     ondansetron (ZOFRAN) 8 MG tablet Take 1 tablet (8 mg total) by mouth every 8 (eight) hours as needed for nausea or  vomiting. 45 tablet 0   oxyCODONE-acetaminophen (PERCOCET/ROXICET) 5-325 MG tablet Take 1 tablet by mouth every 6 (six) hours as needed for severe pain. 60 tablet 0   senna (SENOKOT) 8.6 MG TABS tablet Take 2 tablets by mouth at bedtime.     carvedilol (COREG) 12.5 MG tablet Take 1 tablet (12.5 mg total) by mouth 2 (two) times daily with a meal. 180 tablet 3   docusate sodium (COLACE) 100 MG capsule Take 1 capsule (100 mg total) by mouth 2 (two) times daily. (Patient not taking: Reported on 03/11/2021) 60 capsule 0   tamsulosin (FLOMAX) 0.4 MG CAPS capsule Take 1 capsule (0.4 mg total) by mouth daily. (Patient not taking: Reported on 03/11/2021) 30 capsule 3    VITAL SIGNS: BP (!) 173/105   Pulse 81   Temp 97.9 F (36.6 C)   Resp 17   Ht '5\' 9"'  (  1.753 m)   Wt 180 lb (81.6 kg)   SpO2 96%   BMI 26.58 kg/m  Filed Weights   03/11/21 0834  Weight: 180 lb (81.6 kg)    Estimated body mass index is 26.58 kg/m as calculated from the following:   Height as of this encounter: '5\' 9"'  (1.753 m).   Weight as of this encounter: 180 lb (81.6 kg).  LABS: CBC:    Component Value Date/Time   WBC 4.2 03/11/2021 0829   HGB 11.9 (L) 03/11/2021 0829   HGB 11.0 (L) 02/24/2021 1014   HCT 35.4 (L) 03/11/2021 0829   HCT 35.3 (L) 02/24/2021 1014   PLT 244 03/11/2021 0829   PLT 249 02/24/2021 1014   MCV 92.4 03/11/2021 0829   MCV 96 02/24/2021 1014   MCV 90 01/24/2013 0819   NEUTROABS 2.6 03/11/2021 0829   NEUTROABS 7.4 (H) 01/24/2013 0819   LYMPHSABS 1.3 03/11/2021 0829   LYMPHSABS 1.2 01/24/2013 0819   MONOABS 0.2 03/11/2021 0829   MONOABS 0.3 01/24/2013 0819   EOSABS 0.0 03/11/2021 0829   EOSABS 0.0 01/24/2013 0819   BASOSABS 0.0 03/11/2021 0829   BASOSABS 0.0 01/24/2013 0819   Comprehensive Metabolic Panel:    Component Value Date/Time   NA 136 03/11/2021 0829   NA 136 02/24/2021 1014   NA 138 01/24/2013 0819   K 3.7 03/11/2021 0829   K 3.8 01/24/2013 0819   CL 101 03/11/2021 0829    CL 105 01/24/2013 0819   CO2 20 (L) 03/11/2021 0829   CO2 25 01/24/2013 0819   BUN 16 03/11/2021 0829   BUN 23 02/24/2021 1014   BUN 20 (H) 01/24/2013 0819   CREATININE 1.48 (H) 03/11/2021 0829   CREATININE 1.52 (H) 01/24/2013 0819   GLUCOSE 131 (H) 03/11/2021 0829   GLUCOSE 171 (H) 01/24/2013 0819   CALCIUM 10.0 03/11/2021 0829   CALCIUM 10.4 (H) 02/19/2021 1229   AST 23 03/11/2021 0829   AST 18 01/24/2013 0819   ALT 8 03/11/2021 0829   ALT 15 01/24/2013 0819   ALKPHOS 149 (H) 03/11/2021 0829   ALKPHOS 74 01/24/2013 0819   BILITOT 1.0 03/11/2021 0829   BILITOT 0.4 02/24/2021 1014   BILITOT 0.5 01/24/2013 0819   PROT 6.7 03/11/2021 0829   PROT 6.2 02/24/2021 1014   PROT 6.3 (L) 01/24/2013 0819   ALBUMIN 4.1 03/11/2021 0829   ALBUMIN 4.5 02/24/2021 1014   ALBUMIN 3.6 01/24/2013 0819    RADIOGRAPHIC STUDIES: CT ABDOMEN PELVIS W CONTRAST  Result Date: 03/11/2021 CLINICAL DATA:  86 year old male with abdominal pain. Recent diagnosis of MGUS, concern for multiple myeloma. EXAM: CT ABDOMEN AND PELVIS WITH CONTRAST TECHNIQUE: Multidetector CT imaging of the abdomen and pelvis was performed using the standard protocol following bolus administration of intravenous contrast. CONTRAST:  129m OMNIPAQUE IOHEXOL 300 MG/ML  SOLN COMPARISON:  Noncontrast CT Abdomen and Pelvis 01/29/2021 and earlier. Thoracic and lumbar MRI 01/29/2021. FINDINGS: Lower chest: Stable small to moderate hiatal hernia. Tortuous thoracic aorta. Calcified coronary artery atherosclerosis. No cardiomegaly or pericardial effusion. No pleural effusion. Stable mild lung base atelectasis or scarring. Hepatobiliary: Gallbladder is at the upper limits of normal but does not appear inflamed. Negative liver. No bile duct enlargement. Pancreas: Negative. Spleen: Negative. Adrenals/Urinary Tract: Normal adrenal glands. Nonobstructed kidneys with symmetric renal enhancement and contrast excretion. There is probable small benign left  renal parapelvic cyst accounting for the increased left renal hilum soft tissue on series 7, image 26, stable since 2018. Decompressed  ureters. Decompressed and negative bladder. Stomach/Bowel: Negative large bowel aside from retained stool in the right colon. Diminutive, negative appendix on coronal image 48. Flocculated material in the terminal ileum but no dilated small bowel. Aside from hiatal hernia the stomach is negative. Negative duodenum. No free air or free fluid. Vascular/Lymphatic: Aortoiliac calcified atherosclerosis. Major arterial structures remain patent in the abdomen and pelvis. Tortuous iliac arteries. No lymphadenopathy. Reproductive: Prostatomegaly. Previous left inguinal hernia repair with mesh appears stable. Otherwise negative. Other: No pelvic free fluid. Musculoskeletal: Diffuse osteopenia and heterogeneous bone mineralization similar to the CT last month, distinctly different from a 2018 CT. Interval augmentation of the T12 and L1 compression fractures. New/progressed compression fractures of L2 and L3 since last month. No significant retropulsion of bone or other complicating features. Bone mineralization is especially heterogeneous in the lumbar spine and pelvis, and these are likely pathologic fractures in the setting of MGUS/myeloma. No other acute osseous abnormality identified. IMPRESSION: 1. Diffusely abnormal bone mineralization in keeping with MGUS/Multiple Myeloma with new or progressed pathologic compression fractures of L2 and L3 since last month. Interval augmentation of T12 and L1. 2. No other acute finding in the abdomen or pelvis. Small to moderate hiatal hernia. Prostatomegaly. Aortic Atherosclerosis (ICD10-I70.0). Electronically Signed   By: Genevie Ann M.D.   On: 03/11/2021 09:53   DG Bone Survey Met  Result Date: 02/22/2021 CLINICAL DATA:  87 year old male with T12 compression fracture. Evaluate for metastatic bone lesion. EXAM: METASTATIC BONE SURVEY COMPARISON:  None.  FINDINGS: No acute osseous pathology. No definite metastatic osseous lesion identified. There is a small lucent lesion in the proximal third of the left femoral diaphysis, indeterminate. The bones are osteopenic. T12 and L1 compression fractures and vertebroplasty changes. Atherosclerotic calcification of the aorta. Pelvic hernia repair mesh. IMPRESSION: 1. No acute osseous pathology. No definite metastatic osseous lesion. 2. Small lucent lesion in the proximal third of the left femoral diaphysis, indeterminate. Electronically Signed   By: Anner Crete M.D.   On: 02/22/2021 14:16    PERFORMANCE STATUS (ECOG) : 2 - Symptomatic, <50% confined to bed  Review of Systems Unless otherwise noted, a complete review of systems is negative.  Physical Exam General: NAD Cardiovascular: regular rate and rhythm Pulmonary: Unlabored Extremities: no edema, no joint deformities Skin: no rashes Neurological: Weakness but otherwise nonfocal  IMPRESSION: Patient seen in the ER.  He was accompanied by his wife.  Patient endorses improved back pain after he received one time dose of IV morphine.  He is currently resting comfortably.  Will liberalize Percocet and add PRN morphine to ensure his comfort while hospitalized.  Patient is being seen by neurosurgery for consideration of kyphoplasty.  Patient did find his previous kyphoplasty resulted in a significant improvement in his pain.  Consider starting steroid for inflammatory/bone pain if it is felt that this would not negatively impact his plasma cells for BMBx.  Bisphosphonates should also be considered.  Discussed his work-up for presumed multiple myeloma.  Hopefully, patient can obtain his bone marrow biopsy while hospitalized to expedite work-up.  He is pending outpatient PET scan scheduled on 7/12.  Discussed with Dr. Grayland Ormond who also saw the patient this afternoon.  Note the patient has been hypertensive and has received hydralazine.  Patient was out  of his home antihypertensive, which has been subsequently refilled.  Consider restarting home regimen.  PLAN: -Continue current scope of treatment -Pain control with Percocet. May use IV morphine if Percocet is ineffective -Daily bowel regimen -May  consider steroids/bisphosphonates -Patient pending BMBx -Patient pending neurosurgical eval for consideration of kyphoplasty -Will follow  Case and plan discussed with Drs. Hilma Favors and [place]   Time Total: 60 minutes  Visit consisted of counseling and education dealing with the complex and emotionally intense issues of symptom management and palliative care in the setting of serious and potentially life-threatening illness.Greater than 50%  of this time was spent counseling and coordinating care related to the above assessment and plan.  Signed by: Altha Harm, PhD, NP-C

## 2021-03-11 NOTE — ED Provider Notes (Signed)
[facility] Emergency Department Provider Note  Time seen: 8:30 AM  I have reviewed the triage vital signs and the nursing notes.   HISTORY  Chief Complaint Abdominal Pain   HPI Jack [name] is a 87 y.o. male with a past medical history of gastric reflux, hypertension, hyperlipidemia, possible cancer, presents to the emergency department for back and abdominal pain.  According to the patient he has been experiencing back pain, has been diagnosed with possible "bone cancer" but is currently in the process of this being worked up.  Patient states this morning his back pain was more severe wrapping around to his abdomen so he came to the emergency department for evaluation.  Describes his pain as moderate to severe currently has received 200 mcg of fentanyl prior to arrival by EMS.  Denies any vomiting or diarrhea.  Denies any fever or dysuria.  Largely negative review of systems otherwise.   Past Medical History:  Diagnosis Date   Elevated prostate specific antigen (PSA)    has been 7 for a year    GERD (gastroesophageal reflux disease)    History of colon polyps 2008   [facility],    History of kidney stones    Hyperlipidemia    Hypertension    Prostate hypertrophy    diagnosed at age 44 due to hematospermia   Renal disorder     Patient Active Problem List   Diagnosis Date Noted   Actinic keratosis 02/19/2021   Inguinal hernia 02/19/2021   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   Numbness of foot 02/19/2021   Sciatica 02/19/2021   Shoulder pain 02/19/2021   Palliative care patient 02/17/2021   Closed compression fracture of body of L1 vertebra ([place]) 02/07/2021   Protein-calorie malnutrition, severe 01/31/2021   Hypokalemia    Anemia    Atherosclerosis of aorta (HCC)    Irregular heart rhythm    T12 compression fracture ([place]) 01/02/2021   History of kidney stones    Acute kidney injury superimposed on CKD ([place])     Ureteral stone    Ureteral perforation secondary to stent manipulation 10/16/2017   Abdominal pain 10/16/2017   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/26/2016   Hyperplasia, prostate 02/03/2015   Multinodular goiter 07/03/2014   Thyroid nodule 06/20/2014   Nontoxic uninodular goiter 06/20/2014   Edema 09/24/2013   Encounter for preventive health examination 08/21/2013   Personal history of colonic polyps 08/21/2013   History of venomous snake bite 08/21/2013   Cough 10/02/2012   Prostate hypertrophy    Elevated prostate specific antigen (PSA)    HYPERTRIGLYCERIDEMIA 12/21/2006   Essential hypertension 12/21/2006   GERD 12/21/2006   CALCULUS, KIDNEY 12/21/2006    Past Surgical History:  Procedure Laterality Date   CATARACT EXTRACTION W/PHACO Left 01/10/2018   Procedure: CATARACT EXTRACTION PHACO AND INTRAOCULAR LENS PLACEMENT ([place]);  Surgeon: Birder Robson, MD;  Location: [facility] ORS;  Service: Ophthalmology;  Laterality: Left;  [name] 00:24.8 AP% 14.9 CDE 3.68 Fluid pack lot # [id number]   CATARACT EXTRACTION W/PHACO Right 01/25/2018   Procedure: CATARACT EXTRACTION PHACO AND INTRAOCULAR LENS PLACEMENT ([facility]);  Surgeon: Birder Robson, MD;  Location: [facility] ORS;  Service: Ophthalmology;  Laterality: Right;  [name] 00:42 AP% 10.8 CDE 4.59 Fluid pack lot # [id number]   COLON SURGERY     CYSTOSCOPY W/ URETERAL STENT PLACEMENT Right 10/16/2017   Procedure: right  URETERAL STENT PLACEMENT,cystoscopy bilateral stent removal,rretrograde;  Surgeon: Abbie Sons, MD;  Location: [facility] ORS;  Service: Urology;  Laterality: Right;   CYSTOSCOPY/URETEROSCOPY/HOLMIUM LASER/STENT PLACEMENT Right 12/16/2020   Procedure: CYSTOSCOPY/URETEROSCOPY/HOLMIUM LASER/STENT PLACEMENT;  Surgeon: Abbie Sons, MD;  Location: [facility] ORS;  Service: Urology;  Laterality: Right;   EXTRACORPOREAL SHOCK WAVE LITHOTRIPSY Right 12/11/2020   Procedure: EXTRACORPOREAL SHOCK WAVE LITHOTRIPSY (ESWL);  Surgeon: Abbie Sons, MD;  Location:  [facility] ORS;  Service: Urology;  Laterality: Right;   IR KYPHO EA ADDL LEVEL THORACIC OR LUMBAR  02/02/2021   IR KYPHO LUMBAR INC FX REDUCE BONE BX UNI/BIL CANNULATION INC/IMAGING  02/02/2021   KIDNEY STONE SURGERY     RESECTION SOFT TISSUE TUMOR LEG / ANKLE RADICAL  jan 2009   [facility],  right thigh/knee , nonmalignant   SMALL INTESTINE SURGERY  1946   implaed on picket fence, punctured stomach   TONSILLECTOMY      Prior to Admission medications   Medication Sig Start Date End Date Taking? Authorizing Provider  apixaban (ELIQUIS) 2.5 MG TABS tablet Take 1 tablet (2.5 mg total) by mouth 2 (two) times daily. 02/07/21   [place] Donnamarie Poag, DO  atorvastatin (LIPITOR) 40 MG tablet Take 1 tablet (40 mg total) by mouth daily at 6 PM. 02/24/21 02/24/22  Marrianne Mood D, PA-C  calcitonin, salmon, (MIACALCIN/FORTICAL) 200 UNIT/ACT nasal spray Place 1 spray into alternate nostrils daily. 02/07/21   [place] Donnamarie Poag, DO  carvedilol (COREG) 12.5 MG tablet Take 1 tablet (12.5 mg total) by mouth 2 (two) times daily with a meal. 03/10/21 03/05/22  Borders, Kirt Boys, NP  docusate sodium (COLACE) 100 MG capsule Take 1 capsule (100 mg total) by mouth 2 (two) times daily. 01/07/21   Loletha Grayer, MD  gabapentin (NEURONTIN) 100 MG capsule Take 100 mg by mouth 3 (three) times daily.    [provider]  ondansetron (ZOFRAN) 8 MG tablet Take 1 tablet (8 mg total) by mouth every 8 (eight) hours as needed for nausea or vomiting. 03/10/21   Borders, Kirt Boys, NP  oxyCODONE-acetaminophen (PERCOCET/ROXICET) 5-325 MG tablet Take 1 tablet by mouth every 6 (six) hours as needed for severe pain. 03/10/21   Borders, Kirt Boys, NP  tamsulosin (FLOMAX) 0.4 MG CAPS capsule Take 1 capsule (0.4 mg total) by mouth daily. 12/15/20   Zara Council A, PA-C    Allergies  Allergen Reactions   Quinolones     Aortic dilation contraindicates FQ   Amlodipine Other (See Comments)    LE edema   Azithromycin Nausea And Vomiting   Lisinopril  Cough    Family History  Problem Relation Age of Onset   Hypertension Father    Hyperlipidemia Father    Cancer Sister        thyroid - dx in late 20's    Social History Social History   Tobacco Use   Smoking status: Former    Pack years: 0.00    Types: Cigarettes    Quit date: 07/05/1965    Years since quitting: 55.7   Smokeless tobacco: Never  Vaping Use   Vaping Use: Never used  Substance Use Topics   Alcohol use: Yes    Alcohol/week: 1.0 standard drink    Types: 1 Standard drinks or equivalent per week    Comment: occassionaly   Drug use: No    Review of Systems Constitutional: Negative for fever. Cardiovascular: Negative for chest pain. Respiratory: Negative for shortness of breath. Gastrointestinal: Positive for back pain wrapping around to his abdomen.  Negative for nausea vomiting diarrhea Genitourinary: Negative for urinary compaints Musculoskeletal: Negative for musculoskeletal  complaints Neurological: Negative for headache All other ROS negative  ____________________________________________   PHYSICAL EXAM:  VITAL SIGNS: ED Triage Vitals  Enc Vitals Group     BP      Pulse      Resp      Temp      Temp src      SpO2      Weight      Height      Head Circumference      Peak Flow      Pain Score      Pain Loc      Pain Edu?      Excl. in [place]?     Constitutional: Alert and oriented.  Mild distress due to pain. Eyes: Normal exam ENT      Head: Normocephalic and atraumatic.      Mouth/Throat: Mucous membranes are moist. Cardiovascular: Normal rate, regular rhythm.  Respiratory: Normal respiratory effort without tachypnea nor retractions. Breath sounds are clear  Gastrointestinal: Soft and nontender. No distention.   Musculoskeletal: Nontender with normal range of motion in all extremities.  Neurologic:  Normal speech and language. No gross focal neurologic deficits Skin:  Skin is warm, dry and intact.  Psychiatric: Mood and affect are  normal.   ____________________________________________    EKG  Reviewed and interpreted by myself shows a sinus rhythm at 92 bpm with a narrow QRS, left axis deviation, largely normal intervals with nonspecific ST changes.  ____________________________________________    RADIOLOGY  CT shows new/progressed compression fractures likely pathologic  ____________________________________________   INITIAL IMPRESSION / ASSESSMENT AND PLAN / ED COURSE  Pertinent labs & imaging results that were available during my care of the patient were reviewed by me and considered in my medical decision making (see chart for details).   Patient presents emergency department for abdominal and back pain.  History of back pain, diagnosed with possible bone cancer per patient but this is in the process of being worked up and the patient is pending a bone marrow biopsy.  We will dose pain medication we will check labs we will proceed with a CT scan abdomen and pelvis with contrast to further evaluate.  Patient agreeable to plan of care.  Patient's lab work is largely [place].  CT scan pending.  CT shows 2 compression fractures likely pathologic.  Given the patient's continued IV pain medication requirement despite home tramadol and Percocet we will admit to the hospital service for further work-up treatment and pain control.  Patient agreeable to plan of care.  [name] was evaluated in Emergency Department on 03/11/2021 for the symptoms described in the history of present illness. He was evaluated in the context of the global COVID-19 pandemic, which necessitated consideration that the patient might be at risk for infection with the SARS-CoV-2 virus that causes COVID-19. Institutional protocols and algorithms that pertain to the evaluation of patients at risk for COVID-19 are in a state of rapid change based on information released by regulatory bodies including the CDC and federal and state  organizations. These policies and algorithms were followed during the patient's care in the ED.  ____________________________________________   FINAL CLINICAL IMPRESSION(S) / ED DIAGNOSES  Compression fractures Back pain   Harvest Dark, MD 03/11/21 1501

## 2021-03-11 NOTE — Consult Note (Signed)
[facility]  [phone] [phone]  ID: [name] OB: [birth year]  MR#: [id number]  DHR#:[id number]  Patient Care Team: [name], MD as PCP - General (Family Medicine) End, [name], MD as PCP - Cardiology (Cardiology) [name], MD (Endocrinology)  CHIEF COMPLAINT: Kappa chain myeloma.  INTERVAL HISTORY: Patient is a 87 year old male who presented to the emergency room with increasing significant back pain making it difficult to move.  He was recently seen in the cancer center for suspicion of kappa chain myeloma, but diagnosis has not been confirmed by bone marrow biopsy.  Previously, he had back pain and kyphoplasty, but this was improving until several days ago.  He has no neurologic complaints.  He denies any recent fevers or illnesses.  He has a fair appetite, but denies weight loss.  He has no chest pain, shortness of breath, cough, or hemoptysis.  He denies any nausea, vomiting, constipation, or diarrhea.  He has no urinary complaints.  Patient feels generally terrible, but offers no further specific complaints today.  REVIEW OF SYSTEMS:   Review of Systems  Constitutional: Negative.  Negative for fever, malaise/fatigue and weight loss.  Respiratory: Negative.  Negative for cough, hemoptysis and shortness of breath.   Cardiovascular: Negative.  Negative for chest pain and leg swelling.  Gastrointestinal: Negative.  Negative for abdominal pain.  Genitourinary: Negative.  Negative for dysuria.  Musculoskeletal:  Positive for back pain.  Skin: Negative.  Negative for rash.  Neurological: Negative.  Negative for dizziness, focal weakness, weakness and headaches.  Psychiatric/Behavioral: Negative.  The patient is not nervous/anxious.    As per HPI. Otherwise, a complete review of systems is negative.  PAST MEDICAL HISTORY: Past Medical History:  Diagnosis Date   Elevated prostate specific antigen (PSA)    has been 7  for a year    GERD (gastroesophageal reflux disease)    History of colon polyps 2008   [facility],    History of kidney stones    Hyperlipidemia    Hypertension    Prostate hypertrophy    diagnosed at age 8 due to hematospermia   Renal disorder     PAST SURGICAL HISTORY: Past Surgical History:  Procedure Laterality Date   CATARACT EXTRACTION W/PHACO Left 01/10/2018   Procedure: CATARACT EXTRACTION PHACO AND INTRAOCULAR LENS PLACEMENT ([place]);  Surgeon: Birder Robson, MD;  Location: [facility] ORS;  Service: Ophthalmology;  Laterality: Left;  [name] 00:24.8 AP% 14.9 CDE 3.68 Fluid pack lot # [id number]   CATARACT EXTRACTION W/PHACO Right 01/25/2018   Procedure: CATARACT EXTRACTION PHACO AND INTRAOCULAR LENS PLACEMENT ([facility]);  Surgeon: Birder Robson, MD;  Location: [facility] ORS;  Service: Ophthalmology;  Laterality: Right;  [name] 00:42 AP% 10.8 CDE 4.59 Fluid pack lot # [id number]   COLON SURGERY     CYSTOSCOPY W/ URETERAL STENT PLACEMENT Right 10/16/2017   Procedure: right  URETERAL STENT PLACEMENT,cystoscopy bilateral stent removal,rretrograde;  Surgeon: Abbie Sons, MD;  Location: [facility] ORS;  Service: Urology;  Laterality: Right;   CYSTOSCOPY/URETEROSCOPY/HOLMIUM LASER/STENT PLACEMENT Right 12/16/2020   Procedure: CYSTOSCOPY/URETEROSCOPY/HOLMIUM LASER/STENT PLACEMENT;  Surgeon: Abbie Sons, MD;  Location: [facility] ORS;  Service: Urology;  Laterality: Right;   EXTRACORPOREAL SHOCK WAVE LITHOTRIPSY Right 12/11/2020   Procedure: EXTRACORPOREAL SHOCK WAVE LITHOTRIPSY (ESWL);  Surgeon: Abbie Sons, MD;  Location: [facility] ORS;  Service: Urology;  Laterality: Right;   IR KYPHO EA ADDL LEVEL THORACIC OR LUMBAR  02/02/2021   IR KYPHO LUMBAR INC FX REDUCE BONE BX UNI/BIL CANNULATION  INC/IMAGING  02/02/2021   KIDNEY STONE SURGERY     RESECTION SOFT TISSUE TUMOR LEG / ANKLE RADICAL  jan 2009   [facility],  right thigh/knee , nonmalignant   SMALL INTESTINE SURGERY  1946   implaed on picket fence, punctured  stomach   TONSILLECTOMY      FAMILY HISTORY: Family History  Problem Relation Age of Onset   Hypertension Father    Hyperlipidemia Father    Cancer Sister        thyroid - dx in late 20's    ADVANCED DIRECTIVES (Y/N):  '@ADVDIR' @  HEALTH MAINTENANCE: Social History   Tobacco Use   Smoking status: Former    Pack years: 0.00    Types: Cigarettes    Quit date: 07/05/1965    Years since quitting: 55.7   Smokeless tobacco: Never  Vaping Use   Vaping Use: Never used  Substance Use Topics   Alcohol use: Yes    Alcohol/week: 1.0 standard drink    Types: 1 Standard drinks or equivalent per week    Comment: occassionaly   Drug use: No     Colonoscopy:  PAP:  Bone density:  Lipid panel:  Allergies  Allergen Reactions   Quinolones     Aortic dilation contraindicates FQ   Amlodipine Other (See Comments)    LE edema   Azithromycin Nausea And Vomiting   Lisinopril Cough   Morphine And Related Nausea And Vomiting    Current Facility-Administered Medications  Medication Dose Route Frequency Provider Last Rate Last Admin   acetaminophen (TYLENOL) tablet 650 mg  650 mg Oral Q6H PRN Ivor Costa, MD       atorvastatin (LIPITOR) tablet 40 mg  40 mg Oral q1800 Ivor Costa, MD   40 mg at 03/11/21 1716   baclofen (LIORESAL) tablet 10 mg  10 mg Oral TID Ivor Costa, MD   10 mg at 03/11/21 1716   calcitonin ([place]) (MIACALCIN/FORTICAL) nasal spray 1 spray  1 spray Alternating Nares Daily Ivor Costa, MD   1 spray at 03/11/21 1719   carvedilol (COREG) tablet 25 mg  25 mg Oral BID WC Ivor Costa, MD   25 mg at 03/11/21 1716   fentaNYL (SUBLIMAZE) injection 12.5 mcg  12.5 mcg Intravenous Q2H PRN Ivor Costa, MD       gabapentin (NEURONTIN) capsule 100 mg  100 mg Oral TID Ivor Costa, MD   100 mg at 03/11/21 1522   hydrALAZINE (APRESOLINE) injection 10 mg  10 mg Intravenous Once Ivor Costa, MD       hydrALAZINE (APRESOLINE) injection 5 mg  5 mg Intravenous Q2H PRN Ivor Costa, MD   5 mg at  03/11/21 1509   morphine 2 MG/ML injection 2-4 mg  2-4 mg Intravenous Q2H PRN Borders, Kirt Boys, NP   2 mg at 03/11/21 1512   ondansetron (ZOFRAN) injection 4 mg  4 mg Intravenous Q8H PRN Ivor Costa, MD   4 mg at 03/11/21 1512   oxyCODONE-acetaminophen (PERCOCET/ROXICET) 5-325 MG per tablet 1-2 tablet  1-2 tablet Oral Q4H PRN Borders, Kirt Boys, NP       polyethylene glycol (MIRALAX / GLYCOLAX) packet 17 g  17 g Oral BID PRN Ivor Costa, MD       polyethylene glycol (MIRALAX / GLYCOLAX) packet 17 g  17 g Oral Daily Borders, Kirt Boys, NP   17 g at 03/11/21 1513   senna (SENOKOT) tablet 8.6 mg  1 tablet Oral Daily Ivor Costa, MD   8.6 mg  at 03/11/21 1246   Current Outpatient Medications  Medication Sig Dispense Refill   acetaminophen (TYLENOL) 325 MG tablet Take 650 mg by mouth every 8 (eight) hours as needed for mild pain or moderate pain.     apixaban (ELIQUIS) 2.5 MG TABS tablet Take 1 tablet (2.5 mg total) by mouth 2 (two) times daily. 60 tablet    atorvastatin (LIPITOR) 40 MG tablet Take 1 tablet (40 mg total) by mouth daily at 6 PM. 90 tablet 3   baclofen (LIORESAL) 10 MG tablet Take 10 mg by mouth 3 (three) times daily.     calcitonin, salmon, (MIACALCIN/FORTICAL) 200 UNIT/ACT nasal spray Place 1 spray into alternate nostrils daily. 3.7 mL 12   gabapentin (NEURONTIN) 100 MG capsule Take 100 mg by mouth 3 (three) times daily.     ondansetron (ZOFRAN) 8 MG tablet Take 1 tablet (8 mg total) by mouth every 8 (eight) hours as needed for nausea or vomiting. 45 tablet 0   oxyCODONE-acetaminophen (PERCOCET/ROXICET) 5-325 MG tablet Take 1 tablet by mouth every 6 (six) hours as needed for severe pain. 60 tablet 0   senna (SENOKOT) 8.6 MG TABS tablet Take 2 tablets by mouth at bedtime.     carvedilol (COREG) 12.5 MG tablet Take 1 tablet (12.5 mg total) by mouth 2 (two) times daily with a meal. 180 tablet 3   docusate sodium (COLACE) 100 MG capsule Take 1 capsule (100 mg total) by mouth 2 (two) times  daily. (Patient not taking: Reported on 03/11/2021) 60 capsule 0   tamsulosin (FLOMAX) 0.4 MG CAPS capsule Take 1 capsule (0.4 mg total) by mouth daily. (Patient not taking: Reported on 03/11/2021) 30 capsule 3    OBJECTIVE: Vitals:   03/11/21 1530 03/11/21 1716  BP: (!) 175/89 (!) 163/98  Pulse: 83 93  Resp: 16   Temp:    SpO2: 97%      Body mass index is 26.58 kg/m.    ECOG FS:4 - Bedbound  General: Well-developed, well-nourished, moderate distress secondary to pain. Eyes: Pink conjunctiva, anicteric sclera. HEENT: Normocephalic, moist mucous membranes. Lungs: No audible wheezing or coughing. Heart: Regular rate and rhythm. Abdomen: Soft, nontender, no obvious distention. Musculoskeletal: No edema, cyanosis, or clubbing. Neuro: Alert, answering all questions appropriately. Cranial nerves grossly intact. Skin: No rashes or petechiae noted. Psych: Normal affect. Lymphatics: No cervical, calvicular, axillary or inguinal LAD.   LAB RESULTS:  Lab Results  Component Value Date   NA 136 03/11/2021   K 3.7 03/11/2021   CL 101 03/11/2021   CO2 20 (L) 03/11/2021   GLUCOSE 131 (H) 03/11/2021   BUN 16 03/11/2021   CREATININE 1.48 (H) 03/11/2021   CALCIUM 10.0 03/11/2021   PROT 6.7 03/11/2021   ALBUMIN 4.1 03/11/2021   AST 23 03/11/2021   ALT 8 03/11/2021   ALKPHOS 149 (H) 03/11/2021   BILITOT 1.0 03/11/2021   GFRNONAA 46 (L) 03/11/2021   GFRAA 29 (L) 10/16/2017    Lab Results  Component Value Date   WBC 4.2 03/11/2021   NEUTROABS 2.6 03/11/2021   HGB 11.9 (L) 03/11/2021   HCT 35.4 (L) 03/11/2021   MCV 92.4 03/11/2021   PLT 244 03/11/2021     STUDIES: CT ABDOMEN PELVIS W CONTRAST  Result Date: 03/11/2021 CLINICAL DATA:  87 year old male with abdominal pain. Recent diagnosis of MGUS, concern for multiple myeloma. EXAM: CT ABDOMEN AND PELVIS WITH CONTRAST TECHNIQUE: Multidetector CT imaging of the abdomen and pelvis was performed using the standard protocol following  bolus administration of intravenous contrast. CONTRAST:  146m OMNIPAQUE IOHEXOL 300 MG/ML  SOLN COMPARISON:  Noncontrast CT Abdomen and Pelvis 01/29/2021 and earlier. Thoracic and lumbar MRI 01/29/2021. FINDINGS: Lower chest: Stable small to moderate hiatal hernia. Tortuous thoracic aorta. Calcified coronary artery atherosclerosis. No cardiomegaly or pericardial effusion. No pleural effusion. Stable mild lung base atelectasis or scarring. Hepatobiliary: Gallbladder is at the upper limits of normal but does not appear inflamed. Negative liver. No bile duct enlargement. Pancreas: Negative. Spleen: Negative. Adrenals/Urinary Tract: Normal adrenal glands. Nonobstructed kidneys with symmetric renal enhancement and contrast excretion. There is probable small benign left renal parapelvic cyst accounting for the increased left renal hilum soft tissue on series 7, image 26, stable since 2018. Decompressed ureters. Decompressed and negative bladder. Stomach/Bowel: Negative large bowel aside from retained stool in the right colon. Diminutive, negative appendix on coronal image 48. Flocculated material in the terminal ileum but no dilated small bowel. Aside from hiatal hernia the stomach is negative. Negative duodenum. No free air or free fluid. Vascular/Lymphatic: Aortoiliac calcified atherosclerosis. Major arterial structures remain patent in the abdomen and pelvis. Tortuous iliac arteries. No lymphadenopathy. Reproductive: Prostatomegaly. Previous left inguinal hernia repair with mesh appears stable. Otherwise negative. Other: No pelvic free fluid. Musculoskeletal: Diffuse osteopenia and heterogeneous bone mineralization similar to the CT last month, distinctly different from a 2018 CT. Interval augmentation of the T12 and L1 compression fractures. New/progressed compression fractures of L2 and L3 since last month. No significant retropulsion of bone or other complicating features. Bone mineralization is especially  heterogeneous in the lumbar spine and pelvis, and these are likely pathologic fractures in the setting of MGUS/myeloma. No other acute osseous abnormality identified. IMPRESSION: 1. Diffusely abnormal bone mineralization in keeping with MGUS/Multiple Myeloma with new or progressed pathologic compression fractures of L2 and L3 since last month. Interval augmentation of T12 and L1. 2. No other acute finding in the abdomen or pelvis. Small to moderate hiatal hernia. Prostatomegaly. Aortic Atherosclerosis (ICD10-I70.0). Electronically Signed   By: HGenevie AnnM.D.   On: 03/11/2021 09:53   DG Bone Survey Met  Result Date: 02/22/2021 CLINICAL DATA:  87year old male with T12 compression fracture. Evaluate for metastatic bone lesion. EXAM: METASTATIC BONE SURVEY COMPARISON:  None. FINDINGS: No acute osseous pathology. No definite metastatic osseous lesion identified. There is a small lucent lesion in the proximal third of the left femoral diaphysis, indeterminate. The bones are osteopenic. T12 and L1 compression fractures and vertebroplasty changes. Atherosclerotic calcification of the aorta. Pelvic hernia repair mesh. IMPRESSION: 1. No acute osseous pathology. No definite metastatic osseous lesion. 2. Small lucent lesion in the proximal third of the left femoral diaphysis, indeterminate. Electronically Signed   By: AAnner CreteM.D.   On: 02/22/2021 14:16    ASSESSMENT: Kappa chain myeloma.  PLAN:    Kappa chain myeloma: Although patient does not have an M spike on his SPEP, his kappa chains are significantly elevated.  Bone marrow biopsy will be required to confirm diagnosis.  Case discussed with Dr. MRudene Christianswho plans to send marrow samples from kyphoplasty planned for Friday.  If this is inconclusive, a formal bone marrow biopsy in the iliac crest may be necessary.  Patient will also require PET scan to complete staging which can be accomplished after discharge. Pain: Kyphoplasty as above.  Although likely  related to his underlying myeloma, previous metastatic bone survey did not reveal any distinct lesions that were concerning.  Continue current narcotics as prescribed.  Appreciate palliative care input.  Anemia: Mild, monitor.  Patient has no other evidence of endorgan damage.  Appreciate consult, will follow.  Lloyd Huger, MD   03/11/2021 5:50 PM

## 2021-03-11 NOTE — ED Notes (Signed)
Pt's wife requests that we let the patient's oncologist Dr Grayland Ormond know he is here, test results, etc if necessary for his care.

## 2021-03-11 NOTE — Addendum Note (Signed)
Addended by: Altha Harm R on: 03/11/2021 02:08 PM   Modules accepted: Orders

## 2021-03-11 NOTE — Consult Note (Signed)
Reason for Consult: New lumbar compression fractures Referring Physician: Dr. Verlee Monte is an 87 y.o. male.  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  He had onset of low back pain that is so severe he came to emergency room and is having a great deal of difficulty walking secondary to pain.  He denies sciatica.  He has worn a brace in the past but that is no longer helpful.  He does not recall any recent trauma.  Past Medical History:  Diagnosis Date   Elevated prostate specific antigen (PSA)    has been 7 for a year    GERD (gastroesophageal reflux disease)    History of colon polyps 2008   [facility],    History of kidney stones    Hyperlipidemia    Hypertension    Prostate hypertrophy    diagnosed at age 81 due to hematospermia   Renal disorder     Past Surgical History:  Procedure Laterality Date   CATARACT EXTRACTION W/PHACO Left 01/10/2018   Procedure: CATARACT EXTRACTION PHACO AND INTRAOCULAR LENS PLACEMENT ([place]);  Surgeon: Birder Robson, MD;  Location: [facility] ORS;  Service: Ophthalmology;  Laterality: Left;  [name] 00:24.8 AP% 14.9 CDE 3.68 Fluid pack lot # [id number]   CATARACT EXTRACTION W/PHACO Right 01/25/2018   Procedure: CATARACT EXTRACTION PHACO AND INTRAOCULAR LENS PLACEMENT (I[facility]);  Surgeon: Birder Robson, MD;  Location: [facility] OR[facility];  Service: Ophthalmology;  Laterality: Right;  [name] 00:42 AP% 10.8 CDE 4.59 Fluid pack lot # [id number]   COLON SURGERY     CYSTOSCOPY W/ URETERAL STENT PLACEMENT Right 10/16/2017   Procedure: right  URETERAL STENT PLACEMENT,cystoscopy bilateral stent removal,rretrograde;  Surgeon: Abbie Sons, MD;  Location: [facility] ORS;  Service: Urology;  Laterality: Right;   CYSTOSCOPY/URETEROSCOPY/HOLMIUM LASER/STENT PLACEMENT Right 12/16/2020   Procedure: CYSTOSCOPY/URETEROSCOPY/HOLMIUM LASER/STENT PLACEMENT;  Surgeon: Abbie Sons, MD;  Location: [facility] ORS;  Service: Urology;   Laterality: Right;   EXTRACORPOREAL SHOCK WAVE LITHOTRIPSY Right 12/11/2020   Procedure: EXTRACORPOREAL SHOCK WAVE LITHOTRIPSY (ESWL);  Surgeon: Abbie Sons, MD;  Location: [facility] OR[facility];  Service: Urology;  Laterality: Right;   IR KYPHO EA ADDL LEVEL THORACIC OR LUMBAR  02/02/2021   IR KYPHO LUMBAR INC FX REDUCE BONE BX UNI/BIL CANNULATION INC/IMAGING  02/02/2021   KIDNEY STONE SURGERY     RESECTION SOFT TISSUE TUMOR LEG / ANKLE RADICAL  jan 2009   [facility],  right thigh/knee , nonmalignant   SMALL INTESTINE SURGERY  1946   implaed on picket fence, punctured stomach   TONSILLECTOMY      Family History  Problem Relation Age of Onset   Hypertension Father    Hyperlipidemia Father    Cancer Sister        thyroid - dx in late 20's    Social History:  reports that he quit smoking about 55 years ago. He has never used smokeless tobacco. He reports current alcohol use of about 1.0 standard drink of alcohol per week. He reports that he does not use drugs.  Allergies:  Allergies  Allergen Reactions   Quinolones     Aortic dilation contraindicates FQ   Amlodipine Other (See Comments)    LE edema   Azithromycin Nausea And Vomiting   Lisinopril Cough   Morphine And Related Nausea And Vomiting    Medications: I have reviewed the patient's current medications.  Results for orders placed or performed during the hospital encounter of 03/11/21 (  from the past 48 hour(s))  CBC with Differential     Status: Abnormal   Collection Time: 03/11/21  8:29 AM  Result Value Ref Range   WBC 4.2 4.0 - 10.5 K/uL   RBC 3.83 (L) 4.22 - 5.81 MIL/uL   Hemoglobin 11.9 (L) 13.0 - 17.0 g/dL   HCT 35.4 (L) 39.0 - 52.0 %   MCV 92.4 80.0 - 100.0 fL   MCH 31.1 26.0 - 34.0 pg   MCHC 33.6 30.0 - 36.0 g/dL   RDW 16.2 (H) 11.5 - 15.5 %   Platelets 244 150 - 400 K/uL   nRBC 0.0 0.0 - 0.2 %   Neutrophils Relative % 63 %   Neutro Abs 2.6 1.7 - 7.7 K/uL   Lymphocytes Relative 32 %   Lymphs Abs 1.3 0.7 - 4.0 K/uL    Monocytes Relative 4 %   Monocytes Absolute 0.2 0.1 - 1.0 K/uL   Eosinophils Relative 1 %   Eosinophils Absolute 0.0 0.0 - 0.5 K/uL   Basophils Relative 0 %   Basophils Absolute 0.0 0.0 - 0.1 K/uL   Immature Granulocytes 0 %   Abs Immature Granulocytes 0.01 0.00 - 0.07 K/uL    Comment: Performed at [facility], [address] [place], [place] [address]  Comprehensive metabolic panel     Status: Abnormal   Collection Time: 03/11/21  8:29 AM  Result Value Ref Range   Sodium 136 135 - 145 mmol/L   Potassium 3.7 3.5 - 5.1 mmol/L   Chloride 101 98 - 111 mmol/L   CO2 20 (L) 22 - 32 mmol/L   Glucose, Bld 131 (H) 70 - 99 mg/dL    Comment: Glucose reference range applies only to samples taken after fasting for at least 8 hours.   BUN 16 8 - 23 mg/dL   Creatinine, Ser 1.48 (H) 0.61 - 1.24 mg/dL   Calcium 10.0 8.9 - 10.3 mg/dL   Total Protein 6.7 6.5 - 8.1 g/dL   Albumin 4.1 3.5 - 5.0 g/dL   AST 23 15 - 41 U/L   ALT 8 0 - 44 U/L   Alkaline Phosphatase 149 (H) 38 - 126 U/L   Total Bilirubin 1.0 0.3 - 1.2 mg/dL   GFR, Estimated 46 (L) >60 mL/min    Comment: (NOTE) Calculated using the CKD-EPI Creatinine Equation (2021)    Anion gap 15 5 - 15    Comment: Performed at [facility], [address] [place], [place] [address]  Lipase, blood     Status: None   Collection Time: 03/11/21  8:29 AM  Result Value Ref Range   Lipase 30 11 - 51 U/L    Comment: Performed at [facility], [address] [place], [place] [address]  Brain natriuretic peptide     Status: None   Collection Time: 03/11/21  8:29 AM  Result Value Ref Range   B Natriuretic Peptide 87.3 0.0 - 100.0 pg/mL    Comment: Performed at [facility], [address] [place], [place] [address]  Urinalysis, Complete w Microscopic     Status: Abnormal   Collection Time: 03/11/21  8:38 AM  Result Value Ref Range   Color, Urine STRAW (A) YELLOW   APPearance CLEAR (A) CLEAR   Specific  Gravity, Urine 1.009 1.005 - 1.030   pH 9.0 (H) 5.0 - 8.0   Glucose, UA NEGATIVE NEGATIVE mg/dL   Hgb urine dipstick NEGATIVE NEGATIVE   Bilirubin Urine NEGATIVE NEGATIVE  Ketones, ur 5 (A) NEGATIVE mg/dL   Protein, ur 30 (A) NEGATIVE mg/dL   Nitrite NEGATIVE NEGATIVE   Leukocytes,Ua TRACE (A) NEGATIVE   WBC, UA 0-5 0 - 5 WBC/hpf   Bacteria, UA NONE SEEN NONE SEEN   Squamous Epithelial / LPF NONE SEEN 0 - 5    Comment: Performed at [facility], [address], [place], [place] [address]  Resp Panel by RT-PCR (Flu A&B, Covid) Nasopharyngeal Swab     Status: None   Collection Time: 03/11/21 11:36 AM   Specimen: Nasopharyngeal Swab; Nasopharyngeal(NP) swabs in vial transport medium  Result Value Ref Range   SARS Coronavirus 2 by RT PCR NEGATIVE NEGATIVE    Comment: (NOTE) SARS-CoV-2 target nucleic acids are NOT DETECTED.  The SARS-CoV-2 RNA is generally detectable in upper respiratory specimens during the acute phase of infection. The lowest concentration of SARS-CoV-2 viral copies this assay can detect is 138 copies/mL. A negative result does not preclude SARS-Cov-2 infection and should not be used as the sole basis for treatment or other patient management decisions. A negative result may occur with  improper specimen collection/handling, submission of specimen other than nasopharyngeal swab, presence of viral mutation(s) within the areas targeted by this assay, and inadequate number of viral copies(<138 copies/mL). A negative result must be combined with clinical observations, patient history, and epidemiological information. The expected result is Negative.  Fact Sheet for Patients:  EntrepreneurPulse.com.au  Fact Sheet for Healthcare Providers:  IncredibleEmployment.be  This test is no t yet approved or cleared by the [place] FDA and  has been authorized for detection and/or diagnosis of SARS-CoV-2 by FDA under an  Emergency Use Authorization (EUA). This EUA will remain  in effect (meaning this test can be used) for the duration of the COVID-19 declaration under Section 564(b)(1) of the Act, 21 U.S.C.section 360bbb-3(b)(1), unless the authorization is terminated  or revoked sooner.       Influenza A by PCR NEGATIVE NEGATIVE   Influenza B by PCR NEGATIVE NEGATIVE    Comment: (NOTE) The Xpert Xpress SARS-CoV-2/FLU/RSV plus assay is intended as an aid in the diagnosis of influenza from Nasopharyngeal swab specimens and should not be used as a sole basis for treatment. Nasal washings and aspirates are unacceptable for Xpert Xpress SARS-CoV-2/FLU/RSV testing.  Fact Sheet for Patients: EntrepreneurPulse.com.au  Fact Sheet for Healthcare Providers: IncredibleEmployment.be  This test is not yet approved or cleared by the [place] FDA and has been authorized for detection and/or diagnosis of SARS-CoV-2 by FDA under an Emergency Use Authorization (EUA). This EUA will remain in effect (meaning this test can be used) for the duration of the COVID-19 declaration under Section 564(b)(1) of the Act, 21 U.S.C. section 360bbb-3(b)(1), unless the authorization is terminated or revoked.  Performed at [facility], [address] [place], [place] [address]     CT ABDOMEN PELVIS W CONTRAST  Result Date: 03/11/2021 CLINICAL DATA:  87 year old male with abdominal pain. Recent diagnosis of MGUS, concern for multiple myeloma. EXAM: CT ABDOMEN AND PELVIS WITH CONTRAST TECHNIQUE: Multidetector CT imaging of the abdomen and pelvis was performed using the standard protocol following bolus administration of intravenous contrast. CONTRAST:  143m OMNIPAQUE IOHEXOL 300 MG/ML  SOLN COMPARISON:  Noncontrast CT Abdomen and Pelvis 01/29/2021 and earlier. Thoracic and lumbar MRI 01/29/2021. FINDINGS: Lower chest: Stable small to moderate hiatal hernia. Tortuous thoracic  aorta. Calcified coronary artery atherosclerosis. No cardiomegaly or pericardial effusion. No pleural effusion. Stable mild lung base atelectasis or  scarring. Hepatobiliary: Gallbladder is at the upper limits of normal but does not appear inflamed. Negative liver. No bile duct enlargement. Pancreas: Negative. Spleen: Negative. Adrenals/Urinary Tract: Normal adrenal glands. Nonobstructed kidneys with symmetric renal enhancement and contrast excretion. There is probable small benign left renal parapelvic cyst accounting for the increased left renal hilum soft tissue on series 7, image 26, stable since 2018. Decompressed ureters. Decompressed and negative bladder. Stomach/Bowel: Negative large bowel aside from retained stool in the right colon. Diminutive, negative appendix on coronal image 48. Flocculated material in the terminal ileum but no dilated small bowel. Aside from hiatal hernia the stomach is negative. Negative duodenum. No free air or free fluid. Vascular/Lymphatic: Aortoiliac calcified atherosclerosis. Major arterial structures remain patent in the abdomen and pelvis. Tortuous iliac arteries. No lymphadenopathy. Reproductive: Prostatomegaly. Previous left inguinal hernia repair with mesh appears stable. Otherwise negative. Other: No pelvic free fluid. Musculoskeletal: Diffuse osteopenia and heterogeneous bone mineralization similar to the CT last month, distinctly different from a 2018 CT. Interval augmentation of the T12 and L1 compression fractures. New/progressed compression fractures of L2 and L3 since last month. No significant retropulsion of bone or other complicating features. Bone mineralization is especially heterogeneous in the lumbar spine and pelvis, and these are likely pathologic fractures in the setting of MGUS/myeloma. No other acute osseous abnormality identified. IMPRESSION: 1. Diffusely abnormal bone mineralization in keeping with MGUS/Multiple Myeloma with new or progressed pathologic  compression fractures of L2 and L3 since last month. Interval augmentation of T12 and L1. 2. No other acute finding in the abdomen or pelvis. Small to moderate hiatal hernia. Prostatomegaly. Aortic Atherosclerosis (ICD10-I70.0). Electronically Signed   By: Genevie Ann M.D.   On: 03/11/2021 09:53    Review of Systems Blood pressure (!) 163/98, pulse 93, temperature 97.9 F (36.6 C), resp. rate 16, height _0  (1.753 m), weight 81.6 kg, SpO2 97 %. Physical Exam He is tender to percussion at L2-3 with slight kyphosis present.  No neurodeficit.  Skin is intact. Assessment/Plan: Fairly acute L2 and L3 fractures possibly related to myeloma.  With his inability to ambulate secondary to this recommendation is for kyphoplasty try get that done tomorrow we will also get biopsies of both levels.  Hessie Knows 03/11/2021, 5:53 PM

## 2021-03-12 ENCOUNTER — Inpatient Hospital Stay: Payer: PPO | Admitting: Anesthesiology

## 2021-03-12 ENCOUNTER — Encounter
Admission: EM | Disposition: A | Discharge status: Home Health | Payer: Self-pay | Source: Home / Self Care | Attending: Internal Medicine

## 2021-03-12 ENCOUNTER — Inpatient Hospital Stay: Payer: PPO

## 2021-03-12 DIAGNOSIS — N184 Chronic kidney disease, stage 4 (severe): Secondary | ICD-10-CM | POA: Diagnosis present

## 2021-03-12 DIAGNOSIS — I129 Hypertensive chronic kidney disease with stage 1 through stage 4 chronic kidney disease, or unspecified chronic kidney disease: Secondary | ICD-10-CM | POA: Diagnosis present

## 2021-03-12 DIAGNOSIS — E785 Hyperlipidemia, unspecified: Secondary | ICD-10-CM | POA: Diagnosis present

## 2021-03-12 DIAGNOSIS — M8458XA Pathological fracture in neoplastic disease, other specified site, initial encounter for fracture: Secondary | ICD-10-CM | POA: Diagnosis present

## 2021-03-12 DIAGNOSIS — Y92239 Unspecified place in hospital as the place of occurrence of the external cause: Secondary | ICD-10-CM | POA: Diagnosis not present

## 2021-03-12 DIAGNOSIS — M549 Dorsalgia, unspecified: Secondary | ICD-10-CM | POA: Diagnosis present

## 2021-03-12 DIAGNOSIS — Z86010 Personal history of colonic polyps: Secondary | ICD-10-CM | POA: Diagnosis not present

## 2021-03-12 DIAGNOSIS — I4891 Unspecified atrial fibrillation: Secondary | ICD-10-CM | POA: Diagnosis present

## 2021-03-12 DIAGNOSIS — Z7901 Long term (current) use of anticoagulants: Secondary | ICD-10-CM | POA: Diagnosis not present

## 2021-03-12 DIAGNOSIS — M545 Low back pain, unspecified: Secondary | ICD-10-CM

## 2021-03-12 DIAGNOSIS — Z8249 Family history of ischemic heart disease and other diseases of the circulatory system: Secondary | ICD-10-CM | POA: Diagnosis not present

## 2021-03-12 DIAGNOSIS — C9000 Multiple myeloma not having achieved remission: Secondary | ICD-10-CM

## 2021-03-12 DIAGNOSIS — Z9842 Cataract extraction status, left eye: Secondary | ICD-10-CM | POA: Diagnosis not present

## 2021-03-12 DIAGNOSIS — Z87442 Personal history of urinary calculi: Secondary | ICD-10-CM | POA: Diagnosis not present

## 2021-03-12 DIAGNOSIS — S32020D Wedge compression fracture of second lumbar vertebra, subsequent encounter for fracture with routine healing: Secondary | ICD-10-CM

## 2021-03-12 DIAGNOSIS — R972 Elevated prostate specific antigen [PSA]: Secondary | ICD-10-CM | POA: Diagnosis present

## 2021-03-12 DIAGNOSIS — T402X5A Adverse effect of other opioids, initial encounter: Secondary | ICD-10-CM | POA: Diagnosis not present

## 2021-03-12 DIAGNOSIS — N400 Enlarged prostate without lower urinary tract symptoms: Secondary | ICD-10-CM | POA: Diagnosis present

## 2021-03-12 DIAGNOSIS — K219 Gastro-esophageal reflux disease without esophagitis: Secondary | ICD-10-CM | POA: Diagnosis present

## 2021-03-12 DIAGNOSIS — Z888 Allergy status to other drugs, medicaments and biological substances status: Secondary | ICD-10-CM | POA: Diagnosis not present

## 2021-03-12 DIAGNOSIS — E43 Unspecified severe protein-calorie malnutrition: Secondary | ICD-10-CM | POA: Diagnosis present

## 2021-03-12 DIAGNOSIS — I10 Essential (primary) hypertension: Secondary | ICD-10-CM | POA: Diagnosis not present

## 2021-03-12 DIAGNOSIS — Z83438 Family history of other disorder of lipoprotein metabolism and other lipidemia: Secondary | ICD-10-CM | POA: Diagnosis not present

## 2021-03-12 DIAGNOSIS — K5903 Drug induced constipation: Secondary | ICD-10-CM | POA: Diagnosis not present

## 2021-03-12 DIAGNOSIS — Z881 Allergy status to other antibiotic agents status: Secondary | ICD-10-CM | POA: Diagnosis not present

## 2021-03-12 DIAGNOSIS — Z87891 Personal history of nicotine dependence: Secondary | ICD-10-CM | POA: Diagnosis not present

## 2021-03-12 DIAGNOSIS — Z885 Allergy status to narcotic agent status: Secondary | ICD-10-CM | POA: Diagnosis not present

## 2021-03-12 DIAGNOSIS — M8440XS Pathological fracture, unspecified site, sequela: Secondary | ICD-10-CM | POA: Diagnosis not present

## 2021-03-12 DIAGNOSIS — Z20822 Contact with and (suspected) exposure to covid-19: Secondary | ICD-10-CM | POA: Diagnosis present

## 2021-03-12 DIAGNOSIS — R109 Unspecified abdominal pain: Secondary | ICD-10-CM | POA: Diagnosis not present

## 2021-03-12 DIAGNOSIS — D630 Anemia in neoplastic disease: Secondary | ICD-10-CM | POA: Diagnosis present

## 2021-03-12 DIAGNOSIS — S32000A Wedge compression fracture of unspecified lumbar vertebra, initial encounter for closed fracture: Secondary | ICD-10-CM | POA: Diagnosis present

## 2021-03-12 HISTORY — PX: KYPHOPLASTY: SHX5884

## 2021-03-12 LAB — CBC
HCT: 34.3 % — ABNORMAL LOW (ref 39.0–52.0)
Hemoglobin: 11.5 g/dL — ABNORMAL LOW (ref 13.0–17.0)
MCH: 30.7 pg (ref 26.0–34.0)
MCHC: 33.5 g/dL (ref 30.0–36.0)
MCV: 91.7 fL (ref 80.0–100.0)
Platelets: 239 10*3/uL (ref 150–400)
RBC: 3.74 MIL/uL — ABNORMAL LOW (ref 4.22–5.81)
RDW: 16.1 % — ABNORMAL HIGH (ref 11.5–15.5)
WBC: 3.4 10*3/uL — ABNORMAL LOW (ref 4.0–10.5)
nRBC: 0 % (ref 0.0–0.2)

## 2021-03-12 LAB — BASIC METABOLIC PANEL
Anion gap: 10 (ref 5–15)
BUN: 15 mg/dL (ref 8–23)
CO2: 26 mmol/L (ref 22–32)
Calcium: 9.8 mg/dL (ref 8.9–10.3)
Chloride: 104 mmol/L (ref 98–111)
Creatinine, Ser: 1.21 mg/dL (ref 0.61–1.24)
GFR, Estimated: 59 mL/min — ABNORMAL LOW (ref >60)
Glucose, Bld: 114 mg/dL — ABNORMAL HIGH (ref 70–99)
Potassium: 3.7 mmol/L (ref 3.5–5.1)
Sodium: 140 mmol/L (ref 135–145)

## 2021-03-12 SURGERY — KYPHOPLASTY
Anesthesia: Monitor Anesthesia Care

## 2021-03-12 MED ORDER — 0.9 % SODIUM CHLORIDE (POUR BTL) OPTIME
TOPICAL | Status: DC | PRN
  Administered 2021-03-12: 50 mL

## 2021-03-12 MED ORDER — SODIUM CHLORIDE 0.9 % IV SOLN
12.5000 mg | Freq: Four times a day (QID) | INTRAVENOUS | Status: DC | PRN
  Filled 2021-03-12 (×2): qty 0.5

## 2021-03-12 MED ORDER — CEFAZOLIN SODIUM-DEXTROSE 2-4 GM/100ML-% IV SOLN
INTRAVENOUS | Status: AC
  Filled 2021-03-12: qty 100

## 2021-03-12 MED ORDER — IOHEXOL 180 MG/ML  SOLN
INTRAMUSCULAR | Status: DC | PRN
  Administered 2021-03-12: 20 mL

## 2021-03-12 MED ORDER — MORPHINE SULFATE (PF) 2 MG/ML IV SOLN
INTRAVENOUS | Status: AC
  Administered 2021-03-12: 2 mg via INTRAVENOUS
  Filled 2021-03-12: qty 1

## 2021-03-12 MED ORDER — ONDANSETRON HCL 4 MG/2ML IJ SOLN
INTRAMUSCULAR | Status: AC
  Administered 2021-03-12: 4 mg via INTRAVENOUS
  Filled 2021-03-12: qty 2

## 2021-03-12 MED ORDER — PROMETHAZINE HCL 25 MG/ML IJ SOLN
12.5000 mg | Freq: Once | INTRAMUSCULAR | Status: AC
  Administered 2021-03-12: 12.5 mg via INTRAVENOUS

## 2021-03-12 MED ORDER — LIDOCAINE HCL 1 % IJ SOLN
INTRAMUSCULAR | Status: DC | PRN
  Administered 2021-03-12: 55 mL

## 2021-03-12 MED ORDER — LACTATED RINGERS IV SOLN: INTRAVENOUS | Status: DC | PRN

## 2021-03-12 MED ORDER — FENTANYL CITRATE (PF) 100 MCG/2ML IJ SOLN
INTRAMUSCULAR | Status: AC
  Filled 2021-03-12: qty 2

## 2021-03-12 MED ORDER — SODIUM CHLORIDE FLUSH 0.9 % IV SOLN
INTRAVENOUS | Status: AC
  Filled 2021-03-12: qty 10

## 2021-03-12 MED ORDER — FENTANYL CITRATE (PF) 100 MCG/2ML IJ SOLN: 25.0000 ug | INTRAMUSCULAR | Status: DC | PRN

## 2021-03-12 MED ORDER — PROMETHAZINE HCL 25 MG/ML IJ SOLN
INTRAMUSCULAR | Status: AC
  Filled 2021-03-12: qty 1

## 2021-03-12 MED ORDER — ONDANSETRON HCL 4 MG/2ML IJ SOLN: 4.0000 mg | Freq: Once | INTRAMUSCULAR | Status: AC | PRN

## 2021-03-12 MED ORDER — ACETAMINOPHEN 10 MG/ML IV SOLN
INTRAVENOUS | Status: DC | PRN
  Administered 2021-03-12: 1000 mg via INTRAVENOUS

## 2021-03-12 MED ORDER — APIXABAN 2.5 MG PO TABS
2.5000 mg | ORAL_TABLET | Freq: Two times a day (BID) | ORAL | Status: DC
  Administered 2021-03-13 – 2021-03-17 (×8): 2.5 mg via ORAL
  Filled 2021-03-12 (×8): qty 1

## 2021-03-12 MED ORDER — SEVOFLURANE IN SOLN
RESPIRATORY_TRACT | Status: AC
  Filled 2021-03-12: qty 250

## 2021-03-12 MED ORDER — CEFAZOLIN SODIUM-DEXTROSE 2-3 GM-%(50ML) IV SOLR
INTRAVENOUS | Status: DC | PRN
  Administered 2021-03-12: 2 g via INTRAVENOUS

## 2021-03-12 MED ORDER — FENTANYL CITRATE (PF) 100 MCG/2ML IJ SOLN
INTRAMUSCULAR | Status: DC | PRN
  Administered 2021-03-12 (×4): 25 ug via INTRAVENOUS

## 2021-03-12 MED ORDER — PROPOFOL 10 MG/ML IV BOLUS
INTRAVENOUS | Status: DC | PRN
  Administered 2021-03-12 (×2): 20 mg via INTRAVENOUS

## 2021-03-12 MED ORDER — BUPIVACAINE-EPINEPHRINE (PF) 0.5% -1:200000 IJ SOLN
INTRAMUSCULAR | Status: DC | PRN
  Administered 2021-03-12: 30 mL via PERINEURAL

## 2021-03-12 MED ORDER — DEXMEDETOMIDINE (PRECEDEX) IN NS 20 MCG/5ML (4 MCG/ML) IV SYRINGE
PREFILLED_SYRINGE | INTRAVENOUS | Status: DC | PRN
  Administered 2021-03-12: 20 ug via INTRAVENOUS

## 2021-03-12 SURGICAL SUPPLY — 22 items
ADH SKN CLS APL DERMABOND .7 (GAUZE/BANDAGES/DRESSINGS) ×1
CEMENT KYPHON CX01A KIT/MIXER (Cement) ×2 IMPLANT
DERMABOND ADVANCED (GAUZE/BANDAGES/DRESSINGS) ×1
DERMABOND ADVANCED .7 DNX12 (GAUZE/BANDAGES/DRESSINGS) ×1 IMPLANT
DEVICE BIOPSY BONE KYPHX (INSTRUMENTS) ×2 IMPLANT
DRAPE C-ARM XRAY 36X54 (DRAPES) ×2 IMPLANT
DURAPREP 26ML APPLICATOR (WOUND CARE) ×2 IMPLANT
FEE RENTAL RFA GENERATOR (MISCELLANEOUS) IMPLANT
GAUZE 4X4 16PLY ~~LOC~~+RFID DBL (SPONGE) ×2 IMPLANT
GLOVE SURG SYN 9.0  PF PI (GLOVE) ×2
GLOVE SURG SYN 9.0 PF PI (GLOVE) ×1 IMPLANT
GOWN SRG 2XL LVL 4 RGLN SLV (GOWNS) ×1 IMPLANT
GOWN STRL NON-REIN 2XL LVL4 (GOWNS) ×2
GOWN STRL REUS W/ TWL LRG LVL3 (GOWN DISPOSABLE) ×1 IMPLANT
GOWN STRL REUS W/TWL LRG LVL3 (GOWN DISPOSABLE) ×2
MANIFOLD NEPTUNE II (INSTRUMENTS) ×2 IMPLANT
PACK KYPHOPLASTY (MISCELLANEOUS) ×2 IMPLANT
RENTAL RFA GENERATOR (MISCELLANEOUS) IMPLANT
STRAP SAFETY 5IN WIDE (MISCELLANEOUS) ×2 IMPLANT
SWABSTK COMLB BENZOIN TINCTURE (MISCELLANEOUS) ×2 IMPLANT
TRAY KYPHOPAK 15/3 EXPRESS 1ST (MISCELLANEOUS) ×1 IMPLANT
TRAY KYPHOPAK 20/3 EXPRESS 1ST (MISCELLANEOUS) ×3 IMPLANT

## 2021-03-12 NOTE — Op Note (Signed)
03/12/2021  5:40 PM  PATIENT:  Jack [name]   MRN: [id number]   PRE-OPERATIVE DIAGNOSIS:  closed wedge compression fracture of L2 and L3   POST-OPERATIVE DIAGNOSIS:  closed wedge compression fracture of L2 and L3   PROCEDURE:  Procedure(s): KYPHOPLASTY L2 and L3  SURGEON: Laurene Footman, MD   ASSISTANTS: None   ANESTHESIA:   local and MAC   EBL:  No intake/output data recorded.   BLOOD ADMINISTERED:none   DRAINS: none    LOCAL MEDICATIONS USED:  MARCAINE    and XYLOCAINE    SPECIMEN: L2 and L3 vertebral body biopsies   DISPOSITION OF SPECIMEN: Pathology   COUNTS:  YES   TOURNIQUET:  * No tourniquets in log *   IMPLANTS: Bone cement   DICTATION: .Dragon Dictation  patient was brought to the operating room and after adequate anesthesia was obtained the patient was placed prone.  C arm was brought in in good visualization of the affected level obtained on both AP and lateral projections.  After patient identification and timeout procedures were completed, local anesthetic was infiltrated with 10 cc 1% Xylocaine infiltrated subcutaneously.  This is done the area on the each side of the planned approach.  The back was then prepped and draped in the usual sterile manner and repeat timeout procedure carried out.  A spinal needle was brought down to the pedicle on the each side of L2 and L3 and a 50-50 mix of 1% Xylocaine half percent Sensorcaine with epinephrine total of 20 cc injected on each side.  After allowing this to set a small incision was made and the trocar was advanced into the vertebral body in an extrapedicular fashion.  Biopsy was obtained from each side at both levels drilling was carried out balloon inserted with inflation to 2-1/2 cc on the right and 2-1/2 cc on the left at each level.  When the cement was appropriate consistency 3 cc were injected on the right and 4.5 cc on the left into the vertebral body at each level without extravasation, good fill superior to  inferior endplates and from right to left sides along the inferior endplate.  After the cement had set the trochar was removed and permanent C-arm views obtained.  The wound was closed with Dermabond followed by Band-Aid   PLAN OF CARE: Continue as inpatient   PATIENT DISPOSITION:  PACU - hemodynamically stable.

## 2021-03-12 NOTE — Transfer of Care (Signed)
Immediate Anesthesia Transfer of Care Note  Patient: Jack [name]  Procedure(s) Performed: KYPHOPLASTY, L2, L3  Patient Location: PACU  Anesthesia Type:General  Level of Consciousness: drowsy  Airway & Oxygen Therapy: Patient Spontanous Breathing and Patient connected to nasal cannula oxygen  Post-op Assessment: Report given to RN and Post -op Vital signs reviewed and stable  Post vital signs: Reviewed and stable  Last Vitals:  Vitals Value Taken Time  BP 144/91 03/12/21 1735  Temp    Pulse 58 03/12/21 1740  Resp 14 03/12/21 1738  SpO2 100 % 03/12/21 1740  Vitals shown include unvalidated device data.  Last Pain:  Vitals:   03/12/21 1215  TempSrc:   PainSc: 7          Complications: No notable events documented.

## 2021-03-12 NOTE — Progress Notes (Signed)
Brief Pharmacy Note  Apixaban re-ordered to start 6/30 PM. Patient had kyphoplasty today which is associated with standard bleeding risk. Re-timed to start 7/1 PM per health system guidance.  Benita Gutter  03/12/21

## 2021-03-12 NOTE — Anesthesia Procedure Notes (Signed)
Date/Time: 03/12/2021 4:45 PM Performed by: Doreen Salvage, CRNA Pre-anesthesia Checklist: Patient identified, Emergency Drugs available, Suction available and Patient being monitored Patient Re-evaluated:Patient Re-evaluated prior to induction Oxygen Delivery Method: Circle system utilized Preoxygenation: Pre-oxygenation with 100% oxygen Induction Type: IV induction Ventilation: Mask ventilation without difficulty and Mask ventilation throughout procedure Airway Equipment and Method: Bite block Placement Confirmation: positive ETCO2 Dental Injury: Teeth and Oropharynx as per pre-operative assessment

## 2021-03-12 NOTE — Anesthesia Preprocedure Evaluation (Signed)
Anesthesia Evaluation  Patient identified by MRN, date of birth, ID band Patient awake    Reviewed: Allergy & Precautions, H&P , NPO status , Patient's Chart, lab work & pertinent test results, reviewed documented beta blocker date and time   Airway Mallampati: III  TM Distance: >3 FB Neck ROM: full    Dental no notable dental hx. (+) Teeth Intact   Pulmonary neg pulmonary ROS, former smoker,    Pulmonary exam normal breath sounds clear to auscultation       Cardiovascular Exercise Tolerance: Poor hypertension, On Medications negative cardio ROS   Rhythm:regular Rate:Normal     Neuro/Psych  Neuromuscular disease negative psych ROS   GI/Hepatic Neg liver ROS, GERD  Medicated,  Endo/Other  negative endocrine ROSdiabetes  Renal/GU Renal disease     Musculoskeletal   Abdominal   Peds  Hematology  (+) Blood dyscrasia, anemia ,   Anesthesia Other Findings   Reproductive/Obstetrics negative OB ROS                             Anesthesia Physical Anesthesia Plan  ASA: 3  Anesthesia Plan: MAC   Post-op Pain Management:    Induction:   PONV Risk Score and Plan: 2  Airway Management Planned:   Additional Equipment:   Intra-op Plan:   Post-operative Plan:   Informed Consent: I have reviewed the patients History and Physical, chart, labs and discussed the procedure including the risks, benefits and alternatives for the proposed anesthesia with the patient or authorized representative who has indicated his/her understanding and acceptance.       Plan Discussed with: CRNA  Anesthesia Plan Comments:         Anesthesia Quick Evaluation

## 2021-03-12 NOTE — ED Notes (Signed)
Patient pulled up in bed by this RN and dorian rn

## 2021-03-12 NOTE — Progress Notes (Addendum)
PROGRESS NOTE    Jack [name]   VFI:[id number]  DOB: [birth year]  PCP: [name], MD    DOA: 03/11/2021 LOS: 0   Assessment & Plan   Principal Problem:   Back pain Active Problems:   Essential hypertension   Abdominal pain   Normocytic anemia   Hyperplasia, prostate   Hyperlipidemia   CKD (chronic kidney disease), stage IV (HCC)   Constipation   Atrial fibrillation (HCC)   Compression fracture of L2 and L3   Lumbar compression fracture (HCC)   Back pain due to acute compression fractures of L2 and L3 -likely due to bone involvement of Multiple myeloma -Dr. Cari Caraway was consulted with neurosurgery who recommended kyphoplasty, Dr. Rudene Christians with orthopedic surgery who plan kyphoplasty of L2 and L3 on 03/12/21.  Bone marrow sent for biopsy. --Oncology and Palliative care also consulted --Continue pain control per orders --Continue baclofen --resume Eliquis (after procedure)  Abdominal pain -reported on admission but patient no longer complaining of this.  CT abdomen and pelvis was negative for anything acute to explain it.  Calcium level normal.  Monitor clinically.  Atrial fibrillation -rate controlled.  Continue Coreg.  Hold Eliquis for procedure as above.  CKD stage IV -renal function at baseline.  Monitor BMP.  Normocytic anemia possibly secondary to multiple myeloma -Dr. Grayland Ormond with oncology is consulted.  Follow-up bone biopsy, to be obtained during high [place]  Essential hypertension -continue Coreg.  IV hydralazine as needed.  BPH -continue Flomax  Hyperlipidemia -continue Lipitor  Constipation -bowel regimen per orders.   Patient BMI: Body mass index is 26.58 kg/m.   DVT prophylaxis: Place and maintain sequential compression device Start: 03/11/21 1513   Diet:  Diet Orders (From admission, onward)     Start     Ordered   03/12/21 0900  Diet NPO time specified  Diet effective ____        03/11/21 1757              Code Status: Full Code    Brief Narrative / Hospital Course to Date:   87 y.o. male with medical history significant of HTN, HLD, CKD-IV, GERD, BPH, anemia, a fib on eliquis, ureteral stone, compression fracture of L1 and T12-spine (s/p recent kyphoplasty), who presents with intractable back pain and abdominal pain.  He is currently undergoing evaluation for multiple myeloma with Dr. Grayland Ormond.    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    Subjective 03/12/21    Patient in ED on hold for a bed, being taken for procedure now.  No acute events reported.  Pain currently under fair control.    Disposition Plan & Communication   Status is: Inpatient  Remains inpatient appropriate because:IV treatments appropriate due to intensity of illness or inability to take PO, ongoing diagnostic evaluation and procedure/s planned  Dispo: The patient is from: Home              Anticipated d/c is to: Home              Patient currently is not medically stable to d/c.   Difficult to place patient No   Consults, Procedures, Significant Events   Consultants:  Oncology Palliative care Neurosurgery Orthopedic surgery  Procedures:  Kyphoplasty 03/12/2021  Antimicrobials:  Anti-infectives (From admission, onward)    Start     Dose/Rate Route Frequency  Ordered Stop   03/12/21 1900  [MAR Hold]  ceFAZolin (ANCEF) IVPB 2g/100 mL premix        (MAR Hold since Thu 03/12/2021 at 1137.Hold Reason: Transfer to a Procedural area)   2 g 200 mL/hr over 30 Minutes Intravenous 60 min pre-op 03/11/21 1757           Micro    Objective   Vitals:   03/12/21 0800 03/12/21 0815 03/12/21 0920 03/12/21 1215  BP: (!) 161/95  (!) 163/108 (!) 174/96  Pulse: 80 80 63 71  Resp: (!) 22 (!) _0 Temp:   97.7 F (36.5 C)   TempSrc:      SpO2: 93% 98% 100% 100%  Weight:       Height:        Intake/Output Summary (Last 24 hours) at 03/12/2021 1346 Last data filed at 03/12/2021 1229 Gross per 24 hour  Intake --  Output 300 ml  Net -300 ml   Filed Weights   03/11/21 0834  Weight: 81.6 kg    Physical Exam:  General exam: awake, alert, no acute distress Respiratory system: normal respiratory effort, on room air. Cardiovascular system: RRR, no pedal edema.   Gastrointestinal system: soft, NT, ND Central nervous system: A&O x3. no gross focal neurologic deficits, normal speech Extremities: moves all, no edema, normal tone Skin: dry, intact, normal temperature Psychiatry: normal mood, congruent affect, judgement and insight appear normal  Labs   Data Reviewed: I have personally reviewed following labs and imaging studies  CBC: Recent Labs  Lab 03/11/21 0829 03/12/21 0730  WBC 4.2 3.4*  NEUTROABS 2.6  --   HGB 11.9* 11.5*  HCT 35.4* 34.3*  MCV 92.4 91.7  PLT 244 454   Basic Metabolic Panel: Recent Labs  Lab 03/11/21 0829 03/12/21 0730  NA 136 140  K 3.7 3.7  CL 101 104  CO2 20* 26  GLUCOSE 131* 114*  BUN 16 15  CREATININE 1.48* 1.21  CALCIUM 10.0 9.8   GFR: Estimated Creatinine Clearance: 44.6 mL/min (by C-G formula based on SCr of 1.21 mg/dL). Liver Function Tests: Recent Labs  Lab 03/11/21 0829  AST 23  ALT 8  ALKPHOS 149*  BILITOT 1.0  PROT 6.7  ALBUMIN 4.1   Recent Labs  Lab 03/11/21 0829  LIPASE 30   No results for input(s): AMMONIA in the last 168 hours. Coagulation Profile: Recent Labs  Lab 03/11/21 1957  INR 1.2   Cardiac Enzymes: No results for input(s): CKTOTAL, CKMB, CKMBINDEX, TROPONINI in the last 168 hours. BNP (last 3 results) No results for input(s): PROBNP in the last 8760 hours. HbA1C: No results for input(s): HGBA1C in the last 72 hours. CBG: No results for input(s): GLUCAP in the last 168 hours. Lipid Profile: No results for input(s): CHOL, HDL, LDLCALC, TRIG, CHOLHDL, LDLDIRECT in the  last 72 hours. Thyroid Function Tests: No results for input(s): TSH, T4TOTAL, FREET4, T3FREE, THYROIDAB in the last 72 hours. Anemia Panel: No results for input(s): VITAMINB12, FOLATE, FERRITIN, TIBC, IRON, RETICCTPCT in the last 72 hours. Sepsis Labs: No results for input(s): PROCALCITON, LATICACIDVEN in the last 168 hours.  Recent Results (from the past 240 hour(s))  Resp Panel by RT-PCR (Flu A&B, Covid) Nasopharyngeal Swab     Status: None   Collection Time: 03/11/21 11:36 AM   Specimen: Nasopharyngeal Swab; Nasopharyngeal(NP) swabs in vial transport medium  Result Value Ref Range Status   SARS Coronavirus 2 by RT PCR NEGATIVE NEGATIVE Final  Comment: (NOTE) SARS-CoV-2 target nucleic acids are NOT DETECTED.  The SARS-CoV-2 RNA is generally detectable in upper respiratory specimens during the acute phase of infection. The lowest concentration of SARS-CoV-2 viral copies this assay can detect is 138 copies/mL. A negative result does not preclude SARS-Cov-2 infection and should not be used as the sole basis for treatment or other patient management decisions. A negative result may occur with  improper specimen collection/handling, submission of specimen other than nasopharyngeal swab, presence of viral mutation(s) within the areas targeted by this assay, and inadequate number of viral copies(<138 copies/mL). A negative result must be combined with clinical observations, patient history, and epidemiological information. The expected result is Negative.  Fact Sheet for Patients:  EntrepreneurPulse.com.au  Fact Sheet for Healthcare Providers:  IncredibleEmployment.be  This test is no t yet approved or cleared by the [place] FDA and  has been authorized for detection and/or diagnosis of SARS-CoV-2 by FDA under an Emergency Use Authorization (EUA). This EUA will remain  in effect (meaning this test can be used) for the duration of  the COVID-19 declaration under Section 564(b)(1) of the Act, 21 U.S.C.section 360bbb-3(b)(1), unless the authorization is terminated  or revoked sooner.       Influenza A by PCR NEGATIVE NEGATIVE Final   Influenza B by PCR NEGATIVE NEGATIVE Final    Comment: (NOTE) The Xpert Xpress SARS-CoV-2/FLU/RSV plus assay is intended as an aid in the diagnosis of influenza from Nasopharyngeal swab specimens and should not be used as a sole basis for treatment. Nasal washings and aspirates are unacceptable for Xpert Xpress SARS-CoV-2/FLU/RSV testing.  Fact Sheet for Patients: EntrepreneurPulse.com.au  Fact Sheet for Healthcare Providers: IncredibleEmployment.be  This test is not yet approved or cleared by the [place] FDA and has been authorized for detection and/or diagnosis of SARS-CoV-2 by FDA under an Emergency Use Authorization (EUA). This EUA will remain in effect (meaning this test can be used) for the duration of the COVID-19 declaration under Section 564(b)(1) of the Act, 21 U.S.C. section 360bbb-3(b)(1), unless the authorization is terminated or revoked.  Performed at [facility], [address], [place], [place] [address]       Imaging Studies   CT ABDOMEN PELVIS W CONTRAST  Result Date: 03/11/2021 CLINICAL DATA:  87 year old male with abdominal pain. Recent diagnosis of MGUS, concern for multiple myeloma. EXAM: CT ABDOMEN AND PELVIS WITH CONTRAST TECHNIQUE: Multidetector CT imaging of the abdomen and pelvis was performed using the standard protocol following bolus administration of intravenous contrast. CONTRAST:  119m OMNIPAQUE IOHEXOL 300 MG/ML  SOLN COMPARISON:  Noncontrast CT Abdomen and Pelvis 01/29/2021 and earlier. Thoracic and lumbar MRI 01/29/2021. FINDINGS: Lower chest: Stable small to moderate hiatal hernia. Tortuous thoracic aorta. Calcified coronary artery atherosclerosis. No cardiomegaly or pericardial effusion.  No pleural effusion. Stable mild lung base atelectasis or scarring. Hepatobiliary: Gallbladder is at the upper limits of normal but does not appear inflamed. Negative liver. No bile duct enlargement. Pancreas: Negative. Spleen: Negative. Adrenals/Urinary Tract: Normal adrenal glands. Nonobstructed kidneys with symmetric renal enhancement and contrast excretion. There is probable small benign left renal parapelvic cyst accounting for the increased left renal hilum soft tissue on series 7, image 26, stable since 2018. Decompressed ureters. Decompressed and negative bladder. Stomach/Bowel: Negative large bowel aside from retained stool in the right colon. Diminutive, negative appendix on coronal image 48. Flocculated material in the terminal ileum but no dilated small bowel. Aside from hiatal hernia the stomach is negative. Negative duodenum. No free  air or free fluid. Vascular/Lymphatic: Aortoiliac calcified atherosclerosis. Major arterial structures remain patent in the abdomen and pelvis. Tortuous iliac arteries. No lymphadenopathy. Reproductive: Prostatomegaly. Previous left inguinal hernia repair with mesh appears stable. Otherwise negative. Other: No pelvic free fluid. Musculoskeletal: Diffuse osteopenia and heterogeneous bone mineralization similar to the CT last month, distinctly different from a 2018 CT. Interval augmentation of the T12 and L1 compression fractures. New/progressed compression fractures of L2 and L3 since last month. No significant retropulsion of bone or other complicating features. Bone mineralization is especially heterogeneous in the lumbar spine and pelvis, and these are likely pathologic fractures in the setting of MGUS/myeloma. No other acute osseous abnormality identified. IMPRESSION: 1. Diffusely abnormal bone mineralization in keeping with MGUS/Multiple Myeloma with new or progressed pathologic compression fractures of L2 and L3 since last month. Interval augmentation of T12 and L1.  2. No other acute finding in the abdomen or pelvis. Small to moderate hiatal hernia. Prostatomegaly. Aortic Atherosclerosis (ICD10-I70.0). Electronically Signed   By: Genevie Ann M.D.   On: 03/11/2021 09:53     Medications   Scheduled Meds:  [MAR Hold] atorvastatin  40 mg Oral q1800   [MAR Hold] baclofen  10 mg Oral TID   [MAR Hold] calcitonin (salmon)  1 spray Alternating Nares Daily   [MAR Hold] carvedilol  25 mg Oral BID WC   [MAR Hold] gabapentin  100 mg Oral TID   [MAR Hold] polyethylene glycol  17 g Oral Daily   [MAR Hold] senna  1 tablet Oral Daily   Continuous Infusions:  [MAR Hold]  ceFAZolin (ANCEF) IV     [MAR Hold] promethazine (PHENERGAN) injection (IM or IVPB)         LOS: 0 days    Time spent: 30 minutes    Ezekiel Slocumb, DO [facility]  03/12/2021, 1:46 PM      If 7PM-7AM, please contact night-coverage. How to contact the [facility] Attending or Consulting provider [place] or covering provider during after hours [place], for this patient?    Check the care team in [facility] and look for a) attending/consulting [facility] provider listed and b) the [facility] team listed Log into [email or web address] and use 's universal password to access. If you do not have the password, please contact the hospital operator. Locate the [facility] provider you are looking for under [facility] and page to a number that you can be directly reached. If you still have difficulty reaching the provider, please page the [facility] (Director on Call) for the Hospitalists listed on amion for assistance.

## 2021-03-12 NOTE — Hospital Course (Signed)
87 y.o. male with medical history significant of HTN, HLD, CKD-IV, GERD, BPH, anemia, a fib on eliquis, ureteral stone, compression fracture of L1 and T12-spine (s/p recent kyphoplasty), who presents with intractable back pain and abdominal pain.  He is currently undergoing evaluation for multiple myeloma with Dr. Grayland Ormond.    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

## 2021-03-13 ENCOUNTER — Other Ambulatory Visit: Payer: Self-pay | Admitting: Radiology

## 2021-03-13 ENCOUNTER — Encounter: Payer: Self-pay | Admitting: Orthopedic Surgery

## 2021-03-13 DIAGNOSIS — S32020D Wedge compression fracture of second lumbar vertebra, subsequent encounter for fracture with routine healing: Secondary | ICD-10-CM | POA: Diagnosis not present

## 2021-03-13 DIAGNOSIS — M545 Low back pain, unspecified: Secondary | ICD-10-CM | POA: Diagnosis not present

## 2021-03-13 DIAGNOSIS — C9000 Multiple myeloma not having achieved remission: Secondary | ICD-10-CM | POA: Diagnosis not present

## 2021-03-13 DIAGNOSIS — I10 Essential (primary) hypertension: Secondary | ICD-10-CM | POA: Diagnosis not present

## 2021-03-13 MED ORDER — OXYCODONE HCL 5 MG PO TABS: 5.0000 mg | ORAL_TABLET | ORAL | Status: DC | PRN

## 2021-03-13 MED ORDER — OXYCODONE HCL ER 15 MG PO T12A
15.0000 mg | EXTENDED_RELEASE_TABLET | Freq: Two times a day (BID) | ORAL | Status: DC
  Administered 2021-03-13 – 2021-03-17 (×9): 15 mg via ORAL
  Filled 2021-03-13 (×9): qty 1

## 2021-03-13 MED ORDER — MORPHINE SULFATE 2 MG/ML IJ SOLN
2.0000 mg | INTRAMUSCULAR | Status: DC | PRN
  Filled 2021-03-13: qty 2

## 2021-03-13 MED ORDER — DEXAMETHASONE 4 MG PO TABS
4.0000 mg | ORAL_TABLET | Freq: Two times a day (BID) | ORAL | Status: DC
  Administered 2021-03-13 – 2021-03-17 (×8): 4 mg via ORAL
  Filled 2021-03-13 (×10): qty 1

## 2021-03-13 MED ORDER — MAGNESIUM CITRATE PO SOLN
1.0000 | Freq: Once | ORAL | Status: AC
  Administered 2021-03-13: 1 via ORAL
  Filled 2021-03-13: qty 296

## 2021-03-13 MED ORDER — OXYCODONE HCL 5 MG PO TABS
5.0000 mg | ORAL_TABLET | Freq: Four times a day (QID) | ORAL | Status: DC | PRN
Start: 2021-03-13 — End: 2021-03-17
  Administered 2021-03-13 – 2021-03-14 (×2): 10 mg via ORAL
  Administered 2021-03-14: 5 mg via ORAL
  Administered 2021-03-15: 10 mg via ORAL
  Filled 2021-03-13: qty 1
  Filled 2021-03-13 (×3): qty 2

## 2021-03-13 MED ORDER — ACETAMINOPHEN 500 MG PO TABS
1000.0000 mg | ORAL_TABLET | Freq: Three times a day (TID) | ORAL | Status: DC
  Administered 2021-03-13 – 2021-03-17 (×13): 1000 mg via ORAL
  Filled 2021-03-13 (×13): qty 2

## 2021-03-13 MED ORDER — RENA-VITE PO TABS
1.0000 | ORAL_TABLET | Freq: Every day | ORAL | Status: DC
  Administered 2021-03-13 – 2021-03-16 (×4): 1 via ORAL
  Filled 2021-03-13 (×6): qty 1

## 2021-03-13 NOTE — OR Nursing (Signed)
Contacted Dr Grayland Ormond regarding outpt scheduled Bone Marrow biopsy. He reported that pt was inpt and he had Kyphoplasty yesterday with marrow samples obtained. Hold Bone Marrow biopsy for now.

## 2021-03-13 NOTE — Progress Notes (Signed)
Initial Nutrition Assessment  DOCUMENTATION CODES:  Severe malnutrition in context of chronic illness  INTERVENTION:  Add Vital Cuisine Shake TID, each supplement provides 520 kcal and 22 grams of protein.  Add Magic cup TID with meals, each supplement provides 290 kcal and 9 grams of protein.  Add Rena-Vite daily.  NUTRITION DIAGNOSIS:  Severe Malnutrition related to chronic illness (MGUS/MM) as evidenced by energy intake < or equal to 75% for > or equal to 1 month, percent weight loss, moderate fat depletion, moderate muscle depletion.  GOAL:  Patient will meet greater than or equal to 90% of their needs  MONITOR:  PO intake, Supplement acceptance, Labs, Weight trends, I & O's  REASON FOR ASSESSMENT:  Malnutrition Screening Tool    ASSESSMENT:  87 yo male with a PMH of HTN, HLD, CKD-IV, GERD, BPH, MGUS/MM, anemia, a fib on eliquis, ureteral stone, and compression fracture of L1 and T12-spine (s/p kyphoplasties) who presents with back pain and abdominal pain. 6/30 - kyphoplasty  Spoke with pt at bedside with wife. Pt reports that he has had decreased appetite since March 2022 and from pain in his back. He reports that he eats about 25-50% of what he used to for the past few months as well. He does not take nutrition supplements at home, but he is open to them. Per Epic, pt ate 90% of breakfast this morning.  He was interested in learning more about a feeding tube if it was needed. RD explained the process for obtaining one, but we would attempt supplements and nutrition by mouth first.  Pt endorses significant weight loss since March 2022. Per Epic, in the last 3 months, pt has lost ~29 lbs (14%) which is significant and severe for the time frame.  On exam, pt has moderate depletions throughout his body, bordering on severe. Of note, pt has been ambulating with a walker and cannot walk without assistance since March 2022 as well.  Given the above information, pt is severely  malnourished in the chronic setting.  Recommend adding Hormel shakes TID and Magic Cup TID, as well as Rena-Vite daily to promote intake.   If PO intake declines, consider nutrition support soon.  Medications: reviewed; calcitonin spray, Mag Citrate once, miralax, Senokot, morphine PRN (given twice today), Zofran PRN (given once today), Percocet PRN (given once today)  Labs: reviewed; Glucose 114  NUTRITION - FOCUSED PHYSICAL EXAM: Flowsheet Row Most Recent Value  Orbital Region Moderate depletion  Upper Arm Region Moderate depletion  Thoracic and Lumbar Region No depletion  Buccal Region Moderate depletion  Temple Region Moderate depletion  Clavicle Bone Region Moderate depletion  Clavicle and Acromion Bone Region Moderate depletion  Scapular Bone Region Unable to assess  Dorsal Hand Mild depletion  Patellar Region Mild depletion  Anterior Thigh Region Mild depletion  Posterior Calf Region Mild depletion  Edema (RD Assessment) None  Hair Reviewed  Eyes Reviewed  Mouth Reviewed  Skin Reviewed  Nails Reviewed   Diet Order:   Diet Order             Diet regular Room service appropriate? Yes; Fluid consistency: Thin  Diet effective now                  EDUCATION NEEDS:  Education needs have been addressed  Skin:  Skin Assessment: Skin Integrity Issues: Skin Integrity Issues:: Incisions Incisions: Back, closed  Last BM:  03/07/21  Height:  Ht Readings from Last 1 Encounters:  03/11/21 5\' 9"  (1.753 m)  Weight:  Wt Readings from Last 1 Encounters:  03/11/21 81.6 kg   Ideal Body Weight:  72.7 kg  BMI:  Body mass index is 26.58 kg/m.  Estimated Nutritional Needs:  Kcal:  2100-2300 Protein:  105-120 grams Fluid:  >2 L  Derrel Nip, RD, LDN (she/her/hers) Registered Dietitian I After-Hours/Weekend Pager # in [place]

## 2021-03-13 NOTE — Progress Notes (Signed)
  Subjective: 1 Day Post-Op Procedure(s) (LRB): KYPHOPLASTY, L2, L3 (N/A) Patient reports pain as severe.   Patient is having problems with pain with any type of rolling over Plan is to go Rehab after hospital stay. Negative for chest pain and shortness of breath Fever: no Gastrointestinal: Negative for nausea and vomiting  Objective: Vital signs in last 24 hours: Temp:  [97.6 F (36.4 C)-99.9 F (37.7 C)] 98.6 F (37 C) (07/01 0541) Pulse Rate:  [57-89] 89 (07/01 0541) Resp:  [11-24] 18 (07/01 0541) BP: (125-183)/(67-108) 151/93 (07/01 0541) SpO2:  [93 %-100 %] 98 % (07/01 0541)  Intake/Output from previous day:  Intake/Output Summary (Last 24 hours) at 03/13/2021 0627 Last data filed at 03/12/2021 1732 Gross per 24 hour  Intake 350 ml  Output 150 ml  Net 200 ml    Intake/Output this shift: No intake/output data recorded.  Labs: Recent Labs    03/11/21 0829 03/12/21 0730  HGB 11.9* 11.5*   Recent Labs    03/11/21 0829 03/12/21 0730  WBC 4.2 3.4*  RBC 3.83* 3.74*  HCT 35.4* 34.3*  PLT 244 239   Recent Labs    03/11/21 0829 03/12/21 0730  NA 136 140  K 3.7 3.7  CL 101 104  CO2 20* 26  BUN 16 15  CREATININE 1.48* 1.21  GLUCOSE 131* 114*  CALCIUM 10.0 9.8   Recent Labs    03/11/21 1957  INR 1.2     EXAM General - Patient is Alert and Oriented Lumbar spine-no signs of infection Dressing/Incision - clean, dry, no drainage Motor Function - intact, moving foot and toes well on exam.   Past Medical History:  Diagnosis Date   Elevated prostate specific antigen (PSA)    has been 7 for a year    GERD (gastroesophageal reflux disease)    History of colon polyps 2008   [facility],    History of kidney stones    Hyperlipidemia    Hypertension    Prostate hypertrophy    diagnosed at age 89 due to hematospermia   Renal disorder     Assessment/Plan: 1 Day Post-Op Procedure(s) (LRB): KYPHOPLASTY, L2, L3 (N/A) Principal Problem:   Back  pain Active Problems:   Essential hypertension   Abdominal pain   Normocytic anemia   Hyperplasia, prostate   Hyperlipidemia   CKD (chronic kidney disease), stage IV (HCC)   Constipation   Atrial fibrillation (HCC)   Compression fracture of L2 and L3   Lumbar compression fracture (HCC)  Estimated body mass index is 26.58 kg/m as calculated from the following:   Height as of this encounter: 5\' 9"  (1.753 m).   Weight as of this encounter: 81.6 kg. Advance diet Up with therapy  DVT Prophylaxis -  Eliquis Follow-up with [facility] clinic orthopedics in 2 weeks with Dr. Floyde Parkins, PA-C Orthopaedic Surgery 03/13/2021, 6:27 AM

## 2021-03-13 NOTE — Evaluation (Signed)
Occupational Therapy Evaluation Patient Details Name: Jack [name] MRN: [id number] DOB: 03/11/1933 Today's Date: 03/13/2021    History of Present Illness Jack [name] is an 87 y.o. male who is s/p L2-L3 kyphoplasty. PMH includes:  gastric reflux, AFIB, hypertension, hyperlipidemia, recent T12/L1 kyphoplasty, and possible CA (myeloma).   Clinical Impression   Jack [name] was seen for OT evaluation this date. Pt has had multiple hospital admissions in past ~3 months. He states that prior to his initial admission, he was active and totally independent with ADL management. Pt reports becoming subsequently weaker with each admission since, and states he required assist for most BADL tasks 2/2 increased pain in his back prior to this admission. His spouse is home and able to provide 24/7 assist per the pt. Jack [name] currently demonstrates impairments as described below (See OT problem list) which functionally limit his ability to perform ADL/self-care tasks. Pt currently requires MAX A to perform LB ADL management at bed level. He self-limits activity significantly 2/2 pain and increased fear of causing new fractures. He voices desire to go home, and is adamantly opposed to functional activity during session 2/2 pain.  Pt would benefit from skilled OT services to address noted impairments and functional limitations (see below for any additional details) in order to maximize safety and independence while minimizing falls risk and caregiver burden. Will keep on OT services on trial basis and continue to monitor pt GOC during this admission. MD updated/notified.  Upon hospital DC recommend STR to maximize pt safety and return to PLOF at this time. Will continue to monitor.     Follow Up Recommendations  SNF    Equipment Recommendations  Other (comment) (Defer)    Recommendations for Other Services       Precautions / Restrictions Precautions Precautions: Fall Restrictions Weight Bearing  Restrictions: No      Mobility Bed Mobility Overal bed mobility: Needs Assistance             General bed mobility comments: Deferred for pt safety/comfort this date. Pt requires significant increased time/effort to re-position self in bed. Declines assist 2/2 fear of causing new fractures.    Transfers                      Balance Overall balance assessment: Needs assistance                                         ADL either performed or assessed with clinical judgement   ADL Overall ADL's : Needs assistance/impaired                                       General ADL Comments: Pt severely limited by pain, generalized weakness, and decreased activity tolerance. Pt antalgic t/o session. Declines any OOB/EOB activity.. States he has required bed level care since most recent back pain began. Anticipate MOD-MAX for LB dressing and bathing tasks at bed level. Pt is able to complete UB grooming (face washing) at bed level with set-up/superv.     Vision Baseline Vision/History: Wears glasses Patient Visual Report: No change from baseline       Perception     Praxis      Pertinent Vitals/Pain Pain Assessment: Faces Faces Pain Scale: Hurts whole lot Pain Location:  Low back, ribs. Pain Descriptors / Indicators: Aching;Sore Pain Intervention(s): Limited activity within patient's tolerance;Repositioned;Utilized relaxation techniques;RN gave pain meds during session     Hand Dominance Right   Extremity/Trunk Assessment Upper Extremity Assessment Upper Extremity Assessment: Generalized weakness   Lower Extremity Assessment Lower Extremity Assessment: Defer to PT evaluation;Generalized weakness       Communication Communication Communication: No difficulties   Cognition Arousal/Alertness: Awake/alert Behavior During Therapy: WFL for tasks assessed/performed Overall Cognitive Status: Within Functional Limits for tasks assessed                                  General Comments: Pt severely pain averse t/o session. Declines much functional activity 2/2 pain and fear of causing more fractures. Otherwise A&O, cooperative t/o session.   General Comments       Exercises Other Exercises Other Exercises: Pt educated on back precautions (including BLTs) to maximize comfort and safety with re-positioning in bed, complementary alternative methods for pain management including distraction techniques and deep breathing, and considerations for DC to STR. OT facilitates UB grooming task. See ADL section for additional detail.   Shoulder Instructions      Home Living Family/patient expects to be discharged to:: Private residence Living Arrangements: Spouse/significant other Available Help at Discharge: Family;Available 24 hours/day Type of Home: House Home Access: Stairs to enter [employer] of Steps: 1 Entrance Stairs-Rails: None Home Layout: One level     Bathroom Shower/Tub: Occupational psychologist: Standard Bathroom Accessibility: Yes How Accessible: Accessible via walker Home Equipment: [place] - single point;Shower seat;Grab bars - tub/shower;Hand held Tourist information centre manager - 2 wheels   Additional Comments: lift chair      Prior Functioning/Environment Level of Independence: Independent with assistive device(s)        Comments: Prior to Late march 2022, pt reports he was totally independent with ADL/IADL management. Pt health has declined and he has required more assist with each subsequent hospitalization. Currently requires assist for most BADL tasks. Spouse provides assist in the home.        OT Problem List: Decreased strength;Pain;Decreased range of motion;Decreased activity tolerance;Decreased safety awareness;Impaired balance (sitting and/or standing);Decreased knowledge of use of DME or AE;Decreased knowledge of precautions      OT Treatment/Interventions: Self-care/ADL  training;Therapeutic exercise;Therapeutic activities;DME and/or AE instruction;Patient/family education;Balance training;Energy conservation;Modalities;Manual therapy    OT Goals(Current goals can be found in the care plan section) Acute Rehab OT Goals Patient Stated Goal: To have less pain. OT Goal Formulation: With patient Time For Goal Achievement: 03/27/21 Potential to Achieve Goals: Good ADL Goals Pt Will Perform Grooming: sitting;with set-up;with supervision Pt Will Perform Upper Body Dressing: sitting;with set-up;with supervision;with adaptive equipment Pt Will Perform Lower Body Dressing: sit to/from stand;with min assist;with adaptive equipment  OT Frequency: Min 1X/week   Barriers to D/C:            Co-evaluation              AM-PAC OT "6 Clicks" Daily Activity     Outcome Measure Help from another person eating meals?: None Help from another person taking care of personal grooming?: A Little Help from another person toileting, which includes using toliet, bedpan, or urinal?: A Lot Help from another person bathing (including washing, rinsing, drying)?: A Lot Help from another person to put on and taking off regular upper body clothing?: A Lot Help from another person to put on and  taking off regular lower body clothing?: A Lot 6 Click Score: 15   End of Session Equipment Utilized During Treatment: Gait belt;Rolling walker Nurse Communication: Mobility status  Activity Tolerance: Patient limited by pain Patient left: in bed;with call bell/phone within reach;with bed alarm set  OT Visit Diagnosis: Other abnormalities of gait and mobility (R26.89);Pain Pain - Right/Left:  (Both) Pain - part of body:  (LBP)                Time: 1017-5102 OT Time Calculation (min): 33 min Charges:  OT General Charges $OT Visit: 1 Visit OT Evaluation $OT Eval Moderate Complexity: 1 Mod OT Treatments $Self Care/Home Management : 23-37 mins  Shara Blazing, M.S.,  OTR/L Ascom: [phone] 03/13/21, 3:52 PM

## 2021-03-13 NOTE — Progress Notes (Signed)
PROGRESS NOTE    Jack [name]   YTK:[id number]  DOB: [birth year]  PCP: [name], MD    DOA: 03/11/2021 LOS: 1   Assessment & Plan   Principal Problem:   Back pain Active Problems:   Essential hypertension   Abdominal pain   Normocytic anemia   Hyperplasia, prostate   Hyperlipidemia   CKD (chronic kidney disease), stage IV (HCC)   Constipation   Atrial fibrillation (HCC)   Compression fracture of L2 and L3   Lumbar compression fracture (HCC)   Back pain due to acute compression fractures of L2 and L3 -likely due to bone involvement of Multiple myeloma -Dr. Cari Caraway was consulted with neurosurgery who recommended kyphoplasty, Dr. Rudene Christians with orthopedic surgery who plan kyphoplasty of L2 and L3 on 03/12/21.  Bone marrow sent for biopsy. --Oncology and Palliative care consulted --[place] discussions ongoing --Continue pain control per orders --Continue baclofen --resume Eliquis (after procedure) --add Decadron --Per oncology: will need PET scan outpatient  Abdominal pain -reported on admission but patient no longer complaining of this.  CT abdomen and pelvis was negative for anything acute to explain it.  Calcium level normal.  Monitor clinically.  Atrial fibrillation -rate controlled.  Continue Coreg.  Hold Eliquis for procedure as above.  CKD stage IV -renal function at baseline.  Monitor BMP.  Normocytic anemia possibly secondary to multiple myeloma -Dr. Grayland Ormond with oncology is consulted.  Follow-up bone biopsy, to be obtained during high [place]  Essential hypertension -continue Coreg.  IV hydralazine as needed.  BPH -continue Flomax  Hyperlipidemia -continue Lipitor  Constipation -bowel regimen per orders.   Patient BMI: Body mass index is 26.58 kg/m.   DVT prophylaxis: apixaban (ELIQUIS) tablet 2.5 mg Start: 03/13/21 2200 Place and maintain sequential compression device Start: 03/11/21 1513 apixaban (ELIQUIS) tablet 2.5 mg   Diet:  Diet Orders  (From admission, onward)     Start     Ordered   03/12/21 2127  Diet regular Room service appropriate? Yes; Fluid consistency: Thin  Diet effective now       Question Answer Comment  Room service appropriate? Yes   Fluid consistency: Thin      03/12/21 2126              Code Status: Full Code   Brief Narrative / Hospital Course to Date:   87 y.o. male with medical history significant of HTN, HLD, CKD-IV, GERD, BPH, anemia, a fib on eliquis, ureteral stone, compression fracture of L1 and T12-spine (s/p recent kyphoplasty), who presents with intractable back pain and abdominal pain.  He is currently undergoing evaluation for multiple myeloma with Dr. Grayland Ormond.    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    Subjective 03/13/21    Patient resting in bed, awake but drowsy.  Reports pain in his ribs worse today.  Says pain medication starting to wear off.  Sleeping okay, Phenergan helps.  No other acute complaints.    Disposition Plan & Communication   Status is: Inpatient  Remains inpatient appropriate because:IV treatments appropriate due to intensity of illness or inability to take PO, uncontrolled pain  Dispo: The patient is from: Home              Anticipated d/c is to: Home              Patient currently is  not medically stable to d/c.   Difficult to place patient No   Consults, Procedures, Significant Events   Consultants:  Oncology Palliative care Neurosurgery Orthopedic surgery  Procedures:  Kyphoplasty 03/12/2021  Antimicrobials:  Anti-infectives (From admission, onward)    Start     Dose/Rate Route Frequency Ordered Stop   03/12/21 1900  ceFAZolin (ANCEF) IVPB 2g/100 mL premix        2 g 200 mL/hr over 30 Minutes Intravenous 60 min pre-op 03/11/21 1757     03/12/21 1533  ceFAZolin (ANCEF) 2-4  GM/100ML-% IVPB       Note to Pharmacy: Norton Blizzard  : cabinet override      03/12/21 1533 03/13/21 0344         Micro    Objective   Vitals:   03/13/21 0042 03/13/21 0541 03/13/21 0807 03/13/21 1124  BP: 138/73 (!) 151/93 (!) 148/98 139/75  Pulse: 88 89 85 (!) 58  Resp: _0 Temp: 99.9 F (37.7 C) 98.6 F (37 C) 98.4 F (36.9 C) 98.9 F (37.2 C)  TempSrc:   Oral   SpO2: 94% 98% 96% 97%  Weight:      Height:        Intake/Output Summary (Last 24 hours) at 03/13/2021 1650 Last data filed at 03/13/2021 1341 Gross per 24 hour  Intake 540 ml  Output --  Net 540 ml   Filed Weights   03/11/21 0834  Weight: 81.6 kg    Physical Exam:  General exam: awake, appears drowsy, no acute distress Respiratory system: normal respiratory effort, on room air, clear. Cardiovascular system: RRR, no pedal edema.   Central nervous system: A&O x3. no gross focal neurologic deficits, normal speech Extremities: moves all, no cyanosis, no edema Psychiatry: normal mood, congruent affect, judgement and insight appear normal  Labs   Data Reviewed: I have personally reviewed following labs and imaging studies  CBC: Recent Labs  Lab 03/11/21 0829 03/12/21 0730  WBC 4.2 3.4*  NEUTROABS 2.6  --   HGB 11.9* 11.5*  HCT 35.4* 34.3*  MCV 92.4 91.7  PLT 244 093   Basic Metabolic Panel: Recent Labs  Lab 03/11/21 0829 03/12/21 0730  NA 136 140  K 3.7 3.7  CL 101 104  CO2 20* 26  GLUCOSE 131* 114*  BUN 16 15  CREATININE 1.48* 1.21  CALCIUM 10.0 9.8   GFR: Estimated Creatinine Clearance: 44.6 mL/min (by C-G formula based on SCr of 1.21 mg/dL). Liver Function Tests: Recent Labs  Lab 03/11/21 0829  AST 23  ALT 8  ALKPHOS 149*  BILITOT 1.0  PROT 6.7  ALBUMIN 4.1   Recent Labs  Lab 03/11/21 0829  LIPASE 30   No results for input(s): AMMONIA in the last 168 hours. Coagulation Profile: Recent Labs  Lab 03/11/21 1957  INR 1.2   Cardiac Enzymes: No  results for input(s): CKTOTAL, CKMB, CKMBINDEX, TROPONINI in the last 168 hours. BNP (last 3 results) No results for input(s): PROBNP in the last 8760 hours. HbA1C: No results for input(s): HGBA1C in the last 72 hours. CBG: No results for input(s): GLUCAP in the last 168 hours. Lipid Profile: No results for input(s): CHOL, HDL, LDLCALC, TRIG, CHOLHDL, LDLDIRECT in the last 72 hours. Thyroid Function Tests: No results for input(s): TSH, T4TOTAL, FREET4, T3FREE, THYROIDAB in the last 72 hours. Anemia Panel: No results for input(s): VITAMINB12, FOLATE, FERRITIN, TIBC, IRON, RETICCTPCT in the last 72 hours. Sepsis Labs: No results for input(s): PROCALCITON,  LATICACIDVEN in the last 168 hours.  Recent Results (from the past 240 hour(s))  Resp Panel by RT-PCR (Flu A&B, Covid) Nasopharyngeal Swab     Status: None   Collection Time: 03/11/21 11:36 AM   Specimen: Nasopharyngeal Swab; Nasopharyngeal(NP) swabs in vial transport medium  Result Value Ref Range Status   SARS Coronavirus 2 by RT PCR NEGATIVE NEGATIVE Final    Comment: (NOTE) SARS-CoV-2 target nucleic acids are NOT DETECTED.  The SARS-CoV-2 RNA is generally detectable in upper respiratory specimens during the acute phase of infection. The lowest concentration of SARS-CoV-2 viral copies this assay can detect is 138 copies/mL. A negative result does not preclude SARS-Cov-2 infection and should not be used as the sole basis for treatment or other patient management decisions. A negative result may occur with  improper specimen collection/handling, submission of specimen other than nasopharyngeal swab, presence of viral mutation(s) within the areas targeted by this assay, and inadequate number of viral copies(<138 copies/mL). A negative result must be combined with clinical observations, patient history, and epidemiological information. The expected result is Negative.  Fact Sheet for Patients:   EntrepreneurPulse.com.au  Fact Sheet for Healthcare Providers:  IncredibleEmployment.be  This test is no t yet approved or cleared by the [place] FDA and  has been authorized for detection and/or diagnosis of SARS-CoV-2 by FDA under an Emergency Use Authorization (EUA). This EUA will remain  in effect (meaning this test can be used) for the duration of the COVID-19 declaration under Section 564(b)(1) of the Act, 21 U.S.C.section 360bbb-3(b)(1), unless the authorization is terminated  or revoked sooner.       Influenza A by PCR NEGATIVE NEGATIVE Final   Influenza B by PCR NEGATIVE NEGATIVE Final    Comment: (NOTE) The Xpert Xpress SARS-CoV-2/FLU/RSV plus assay is intended as an aid in the diagnosis of influenza from Nasopharyngeal swab specimens and should not be used as a sole basis for treatment. Nasal washings and aspirates are unacceptable for Xpert Xpress SARS-CoV-2/FLU/RSV testing.  Fact Sheet for Patients: EntrepreneurPulse.com.au  Fact Sheet for Healthcare Providers: IncredibleEmployment.be  This test is not yet approved or cleared by the [place] FDA and has been authorized for detection and/or diagnosis of SARS-CoV-2 by FDA under an Emergency Use Authorization (EUA). This EUA will remain in effect (meaning this test can be used) for the duration of the COVID-19 declaration under Section 564(b)(1) of the Act, 21 U.S.C. section 360bbb-3(b)(1), unless the authorization is terminated or revoked.  Performed at [facility], [address], [place], [place] [address]       Imaging Studies   DG Lumbar Spine 2-3 Views  Result Date: 03/12/2021 CLINICAL DATA:  Provided history: Fracture. Additional history provided: L2-L3 kyphoplasty. Provided fluoroscopy time 2 minutes, 27 seconds. EXAM: LUMBAR SPINE - 2-3 VIEW; DG C-ARM 1-60 MIN COMPARISON:  CT abdomen/pelvis 03/11/2021.  FINDINGS: AP and lateral view intraprocedural fluoroscopic images of the lumbar spine are submitted, 2 images total. On the provided images, kyphoplasty material is present within three contiguous lumbar vertebral bodies (these are presumably the L1, L2 and L3 vertebral bodies given the provided history and findings on prior imaging. However, neither the thoracolumbar nor lumbosacral junction are included in the field of view to allow exact level determination). IMPRESSION: Two intraprocedural fluoroscopic images of the lumbar spine from reported L2 and L3 kyphoplasty, as described. Electronically Signed   By: Kellie Simmering DO   On: 03/12/2021 18:03   DG C-Arm 1-60 Min  Result Date: 03/12/2021  CLINICAL DATA:  Provided history: Fracture. Additional history provided: L2-L3 kyphoplasty. Provided fluoroscopy time 2 minutes, 27 seconds. EXAM: LUMBAR SPINE - 2-3 VIEW; DG C-ARM 1-60 MIN COMPARISON:  CT abdomen/pelvis 03/11/2021. FINDINGS: AP and lateral view intraprocedural fluoroscopic images of the lumbar spine are submitted, 2 images total. On the provided images, kyphoplasty material is present within three contiguous lumbar vertebral bodies (these are presumably the L1, L2 and L3 vertebral bodies given the provided history and findings on prior imaging. However, neither the thoracolumbar nor lumbosacral junction are included in the field of view to allow exact level determination). IMPRESSION: Two intraprocedural fluoroscopic images of the lumbar spine from reported L2 and L3 kyphoplasty, as described. Electronically Signed   By: Kellie Simmering DO   On: 03/12/2021 18:03     Medications   Scheduled Meds:  acetaminophen  1,000 mg Oral Q8H   apixaban  2.5 mg Oral BID   atorvastatin  40 mg Oral q1800   baclofen  10 mg Oral TID   calcitonin (salmon)  1 spray Alternating Nares Daily   carvedilol  25 mg Oral BID WC   gabapentin  100 mg Oral TID   multivitamin  1 tablet Oral QHS   oxyCODONE  15 mg Oral Q12H    polyethylene glycol  17 g Oral Daily   senna  1 tablet Oral Daily   Continuous Infusions:   ceFAZolin (ANCEF) IV     promethazine (PHENERGAN) injection (IM or IVPB)         LOS: 1 day    Time spent: 30 minutes with > 50% spent at bedside and in coordination of care.    Ezekiel Slocumb, DO [facility]  03/13/2021, 4:50 PM      If 7PM-7AM, please contact night-coverage. How to contact the [facility] Attending or Consulting provider [place] or covering provider during after hours [place], for this patient?    Check the care team in [facility] and look for a) attending/consulting [facility] provider listed and b) the [facility] team listed Log into [email or web address] and use [place]'s universal password to access. If you do not have the password, please contact the hospital operator. Locate the [facility] provider you are looking for under [facility] and page to a number that you can be directly reached. If you still have difficulty reaching the provider, please page the [facility] (Director on Call) for the Hospitalists listed on amion for assistance.

## 2021-03-13 NOTE — Progress Notes (Signed)
[name]  [phone] [phone]  ID: Jack [name] OB: 1933-02-19  MR#: [id number]  DXA#:[id number]  Patient Care Team: [name], MD as PCP - General (Family Medicine) End, [name], MD as PCP - Cardiology (Cardiology) [name], MD (Endocrinology)  CHIEF COMPLAINT: Kappa chain myeloma.  INTERVAL HISTORY: Patient continues to have significant pain despite kyphoplasty yesterday.  Although admits it has improved and he was able to sleep a little bit.  He continues to have chronic weakness and fatigue, but offers no further complaints.  REVIEW OF SYSTEMS:   Review of Systems  Constitutional:  Positive for malaise/fatigue. Negative for fever and weight loss.  Respiratory: Negative.  Negative for cough, hemoptysis and shortness of breath.   Cardiovascular: Negative.  Negative for chest pain and leg swelling.  Gastrointestinal: Negative.  Negative for abdominal pain.  Genitourinary: Negative.  Negative for dysuria.  Musculoskeletal:  Positive for back pain.  Skin: Negative.  Negative for rash.  Neurological:  Positive for weakness. Negative for dizziness, focal weakness and headaches.  Psychiatric/Behavioral: Negative.  The patient is not nervous/anxious.    As per HPI. Otherwise, a complete review of systems is negative.  PAST MEDICAL HISTORY: Past Medical History:  Diagnosis Date   Elevated prostate specific antigen (PSA)    has been 7 for a year    GERD (gastroesophageal reflux disease)    History of colon polyps 2008   [facility],    History of kidney stones    Hyperlipidemia    Hypertension    Prostate hypertrophy    diagnosed at age 52 due to hematospermia   Renal disorder     PAST SURGICAL HISTORY: Past Surgical History:  Procedure Laterality Date   CATARACT EXTRACTION W/PHACO Left 01/10/2018   Procedure: CATARACT EXTRACTION PHACO AND INTRAOCULAR LENS PLACEMENT ([place]);  Surgeon: Birder Robson,  MD;  Location: [facility] OR[facility];  Service: Ophthalmology;  Laterality: Left;  [name] 00:24.8 AP% 14.9 CDE 3.68 Fluid pack lot # [id number]   CATARACT EXTRACTION W/PHACO Right 01/25/2018   Procedure: CATARACT EXTRACTION PHACO AND INTRAOCULAR LENS PLACEMENT ([facility]);  Surgeon: Birder Robson, MD;  Location: [facility];  Service: Ophthalmology;  Laterality: Right;  [name] 00:42 AP% 10.8 CDE 4.59 Fluid pack lot # [id number]   COLON SURGERY     CYSTOSCOPY W/ URETERAL STENT PLACEMENT Right 10/16/2017   Procedure: right  URETERAL STENT PLACEMENT,cystoscopy bilateral stent removal,rretrograde;  Surgeon: Abbie Sons, MD;  Location: [facility];  Service: Urology;  Laterality: Right;   CYSTOSCOPY/URETEROSCOPY/HOLMIUM LASER/STENT PLACEMENT Right 12/16/2020   Procedure: CYSTOSCOPY/URETEROSCOPY/HOLMIUM LASER/STENT PLACEMENT;  Surgeon: Abbie Sons, MD;  Location: [facility];  Service: Urology;  Laterality: Right;   EXTRACORPOREAL SHOCK WAVE LITHOTRIPSY Right 12/11/2020   Procedure: EXTRACORPOREAL SHOCK WAVE LITHOTRIPSY (ESWL);  Surgeon: Abbie Sons, MD;  Location: [facility];  Service: Urology;  Laterality: Right;   IR KYPHO EA ADDL LEVEL THORACIC OR LUMBAR  02/02/2021   IR KYPHO LUMBAR INC FX REDUCE BONE BX UNI/BIL CANNULATION INC/IMAGING  02/02/2021   KIDNEY STONE SURGERY     KYPHOPLASTY N/A 03/12/2021   Procedure: Hewitt Shorts, L3;  Surgeon: Hessie Knows, MD;  Location: [facility];  Service: Orthopedics;  Laterality: N/A;   RESECTION SOFT TISSUE TUMOR LEG / ANKLE RADICAL  jan 2009   [facility],  right thigh/knee , nonmalignant   SMALL INTESTINE SURGERY  1946   implaed on picket fence, punctured stomach   TONSILLECTOMY      FAMILY HISTORY: Family History  Problem Relation Age of Onset   Hypertension Father    Hyperlipidemia Father    Cancer Sister        thyroid - dx in late 20's    ADVANCED DIRECTIVES (Y/N):  '@ADVDIR' @  HEALTH MAINTENANCE: Social History   Tobacco Use   Smoking status: Former    Pack years:  0.00    Types: Cigarettes    Quit date: 07/05/1965    Years since quitting: 55.7   Smokeless tobacco: Never  Vaping Use   Vaping Use: Never used  Substance Use Topics   Alcohol use: Yes    Alcohol/week: 1.0 standard drink    Types: 1 Standard drinks or equivalent per week    Comment: occassionaly   Drug use: No     Colonoscopy:  PAP:  Bone density:  Lipid panel:  Allergies  Allergen Reactions   Quinolones     Aortic dilation contraindicates FQ   Amlodipine Other (See Comments)    LE edema   Azithromycin Nausea And Vomiting   Lisinopril Cough   Morphine And Related Nausea And Vomiting    Current Facility-Administered Medications  Medication Dose Route Frequency Provider Last Rate Last Admin   acetaminophen (TYLENOL) tablet 1,000 mg  1,000 mg Oral Q8H Griffith, Kelly A, DO       apixaban (ELIQUIS) tablet 2.5 mg  2.5 mg Oral BID Hessie Knows, MD       atorvastatin (LIPITOR) tablet 40 mg  40 mg Oral q1800 Hessie Knows, MD   40 mg at 03/11/21 1716   baclofen (LIORESAL) tablet 10 mg  10 mg Oral TID Hessie Knows, MD   10 mg at 03/13/21 4132   calcitonin (salmon) (MIACALCIN/FORTICAL) nasal spray 1 spray  1 spray Alternating Nares Daily Hessie Knows, MD   1 spray at 03/13/21 0834   carvedilol (COREG) tablet 25 mg  25 mg Oral BID WC Hessie Knows, MD   25 mg at 03/13/21 4401   ceFAZolin (ANCEF) IVPB 2g/100 mL premix  2 g Intravenous 60 min Pre-Op Hessie Knows, MD       fentaNYL (SUBLIMAZE) injection 12.5 mcg  12.5 mcg Intravenous Q2H PRN Hessie Knows, MD   12.5 mcg at 03/11/21 1802   gabapentin (NEURONTIN) capsule 100 mg  100 mg Oral TID Hessie Knows, MD   100 mg at 03/13/21 0272   hydrALAZINE (APRESOLINE) injection 5 mg  5 mg Intravenous Q2H PRN Hessie Knows, MD   5 mg at 03/12/21 2345   morphine 2 MG/ML injection 2-4 mg  2-4 mg Intravenous Q2H PRN Nicole Kindred A, DO       multivitamin (RENA-VIT) tablet 1 tablet  1 tablet Oral QHS Nicole Kindred A, DO       ondansetron  (ZOFRAN) injection 4 mg  4 mg Intravenous Q8H PRN Hessie Knows, MD   4 mg at 03/13/21 5366   oxyCODONE (Oxy IR/ROXICODONE) immediate release tablet 5-10 mg  5-10 mg Oral Q6H PRN Nicole Kindred A, DO       oxyCODONE (OXYCONTIN) 12 hr tablet 15 mg  15 mg Oral Q12H Griffith, Kelly A, DO   15 mg at 03/13/21 1321   polyethylene glycol (MIRALAX / GLYCOLAX) packet 17 g  17 g Oral BID PRN Hessie Knows, MD       polyethylene glycol (MIRALAX / GLYCOLAX) packet 17 g  17 g Oral Daily Hessie Knows, MD   17 g at 03/13/21 0832   promethazine (PHENERGAN) 12.5 mg in sodium chloride 0.9 % 50  mL IVPB  12.5 mg Intravenous Q6H PRN Hessie Knows, MD       senna [facility]) tablet 8.6 mg  1 tablet Oral Daily Hessie Knows, MD   8.6 mg at 03/13/21 0832    OBJECTIVE: Vitals:   03/13/21 0807 03/13/21 1124  BP: (!) 148/98 139/75  Pulse: 85 (!) 58  Resp: 20 15  Temp: 98.4 F (36.9 C) 98.9 F (37.2 C)  SpO2: 96% 97%     Body mass index is 26.58 kg/m.    ECOG FS:3 - Symptomatic, >50% confined to bed  General: Well-developed, well-nourished, no acute distress. Eyes: Pink conjunctiva, anicteric sclera. HEENT: Normocephalic, moist mucous membranes. Lungs: No audible wheezing or coughing. Heart: Regular rate and rhythm. Abdomen: Soft, nontender, no obvious distention. Musculoskeletal: No edema, cyanosis, or clubbing. Neuro: Alert, answering all questions appropriately. Cranial nerves grossly intact. Skin: No rashes or petechiae noted. Psych: Normal affect.   LAB RESULTS:  Lab Results  Component Value Date   NA 140 03/12/2021   K 3.7 03/12/2021   CL 104 03/12/2021   CO2 26 03/12/2021   GLUCOSE 114 (H) 03/12/2021   BUN 15 03/12/2021   CREATININE 1.21 03/12/2021   CALCIUM 9.8 03/12/2021   PROT 6.7 03/11/2021   ALBUMIN 4.1 03/11/2021   AST 23 03/11/2021   ALT 8 03/11/2021   ALKPHOS 149 (H) 03/11/2021   BILITOT 1.0 03/11/2021   GFRNONAA 59 (L) 03/12/2021   GFRAA 29 (L) 10/16/2017    Lab Results   Component Value Date   WBC 3.4 (L) 03/12/2021   NEUTROABS 2.6 03/11/2021   HGB 11.5 (L) 03/12/2021   HCT 34.3 (L) 03/12/2021   MCV 91.7 03/12/2021   PLT 239 03/12/2021     STUDIES: DG Lumbar Spine 2-3 Views  Result Date: 03/12/2021 CLINICAL DATA:  Provided history: Fracture. Additional history provided: L2-L3 kyphoplasty. Provided fluoroscopy time 2 minutes, 27 seconds. EXAM: LUMBAR SPINE - 2-3 VIEW; DG C-ARM 1-60 MIN COMPARISON:  CT abdomen/pelvis 03/11/2021. FINDINGS: AP and lateral view intraprocedural fluoroscopic images of the lumbar spine are submitted, 2 images total. On the provided images, kyphoplasty material is present within three contiguous lumbar vertebral bodies (these are presumably the L1, L2 and L3 vertebral bodies given the provided history and findings on prior imaging. However, neither the thoracolumbar nor lumbosacral junction are included in the field of view to allow exact level determination). IMPRESSION: Two intraprocedural fluoroscopic images of the lumbar spine from reported L2 and L3 kyphoplasty, as described. Electronically Signed   By: Kellie Simmering DO   On: 03/12/2021 18:03   CT ABDOMEN PELVIS W CONTRAST  Result Date: 03/11/2021 CLINICAL DATA:  87 year old male with abdominal pain. Recent diagnosis of MGUS, concern for multiple myeloma. EXAM: CT ABDOMEN AND PELVIS WITH CONTRAST TECHNIQUE: Multidetector CT imaging of the abdomen and pelvis was performed using the standard protocol following bolus administration of intravenous contrast. CONTRAST:  148m OMNIPAQUE IOHEXOL 300 MG/ML  SOLN COMPARISON:  Noncontrast CT Abdomen and Pelvis 01/29/2021 and earlier. Thoracic and lumbar MRI 01/29/2021. FINDINGS: Lower chest: Stable small to moderate hiatal hernia. Tortuous thoracic aorta. Calcified coronary artery atherosclerosis. No cardiomegaly or pericardial effusion. No pleural effusion. Stable mild lung base atelectasis or scarring. Hepatobiliary: Gallbladder is at the upper  limits of normal but does not appear inflamed. Negative liver. No bile duct enlargement. Pancreas: Negative. Spleen: Negative. Adrenals/Urinary Tract: Normal adrenal glands. Nonobstructed kidneys with symmetric renal enhancement and contrast excretion. There is probable small benign left renal parapelvic cyst accounting  for the increased left renal hilum soft tissue on series 7, image 26, stable since 2018. Decompressed ureters. Decompressed and negative bladder. Stomach/Bowel: Negative large bowel aside from retained stool in the right colon. Diminutive, negative appendix on coronal image 48. Flocculated material in the terminal ileum but no dilated small bowel. Aside from hiatal hernia the stomach is negative. Negative duodenum. No free air or free fluid. Vascular/Lymphatic: Aortoiliac calcified atherosclerosis. Major arterial structures remain patent in the abdomen and pelvis. Tortuous iliac arteries. No lymphadenopathy. Reproductive: Prostatomegaly. Previous left inguinal hernia repair with mesh appears stable. Otherwise negative. Other: No pelvic free fluid. Musculoskeletal: Diffuse osteopenia and heterogeneous bone mineralization similar to the CT last month, distinctly different from a 2018 CT. Interval augmentation of the T12 and L1 compression fractures. New/progressed compression fractures of L2 and L3 since last month. No significant retropulsion of bone or other complicating features. Bone mineralization is especially heterogeneous in the lumbar spine and pelvis, and these are likely pathologic fractures in the setting of MGUS/myeloma. No other acute osseous abnormality identified. IMPRESSION: 1. Diffusely abnormal bone mineralization in keeping with MGUS/Multiple Myeloma with new or progressed pathologic compression fractures of L2 and L3 since last month. Interval augmentation of T12 and L1. 2. No other acute finding in the abdomen or pelvis. Small to moderate hiatal hernia. Prostatomegaly. Aortic  Atherosclerosis (ICD10-I70.0). Electronically Signed   By: Genevie Ann M.D.   On: 03/11/2021 09:53   DG Bone Survey Met  Result Date: 02/22/2021 CLINICAL DATA:  87 year old male with T12 compression fracture. Evaluate for metastatic bone lesion. EXAM: METASTATIC BONE SURVEY COMPARISON:  None. FINDINGS: No acute osseous pathology. No definite metastatic osseous lesion identified. There is a small lucent lesion in the proximal third of the left femoral diaphysis, indeterminate. The bones are osteopenic. T12 and L1 compression fractures and vertebroplasty changes. Atherosclerotic calcification of the aorta. Pelvic hernia repair mesh. IMPRESSION: 1. No acute osseous pathology. No definite metastatic osseous lesion. 2. Small lucent lesion in the proximal third of the left femoral diaphysis, indeterminate. Electronically Signed   By: Anner Crete M.D.   On: 02/22/2021 14:16   DG C-Arm 1-60 Min  Result Date: 03/12/2021 CLINICAL DATA:  Provided history: Fracture. Additional history provided: L2-L3 kyphoplasty. Provided fluoroscopy time 2 minutes, 27 seconds. EXAM: LUMBAR SPINE - 2-3 VIEW; DG C-ARM 1-60 MIN COMPARISON:  CT abdomen/pelvis 03/11/2021. FINDINGS: AP and lateral view intraprocedural fluoroscopic images of the lumbar spine are submitted, 2 images total. On the provided images, kyphoplasty material is present within three contiguous lumbar vertebral bodies (these are presumably the L1, L2 and L3 vertebral bodies given the provided history and findings on prior imaging. However, neither the thoracolumbar nor lumbosacral junction are included in the field of view to allow exact level determination). IMPRESSION: Two intraprocedural fluoroscopic images of the lumbar spine from reported L2 and L3 kyphoplasty, as described. Electronically Signed   By: Kellie Simmering DO   On: 03/12/2021 18:03    ASSESSMENT: Kappa chain myeloma.  PLAN:   Kappa chain myeloma: Although patient does not have an M spike on his  SPEP, his kappa chains are significantly elevated.  Patient underwent kyphoplasty yesterday with Dr. Rudene Christians and marrow samples were sent to pathology for evaluation.  If this is inconclusive, a formal bone marrow biopsy in the iliac crest may be necessary.  Patient will also require PET scan to complete staging which can be accomplished after discharge. Pain: Possibly mildly improved.  Kyphoplasty yesterday as above. Although likely related  to his underlying myeloma, previous metastatic bone survey did not reveal any distinct lesions that were concerning.  Continue current narcotics as prescribed.  Appreciate palliative care input. Anemia: Mild, monitor.  Patient has no other evidence of endorgan damage.   Will follow.    Lloyd Huger, MD   03/13/2021 2:04 PM

## 2021-03-14 DIAGNOSIS — M8440XA Pathological fracture, unspecified site, initial encounter for fracture: Secondary | ICD-10-CM | POA: Diagnosis present

## 2021-03-14 DIAGNOSIS — M8440XS Pathological fracture, unspecified site, sequela: Secondary | ICD-10-CM

## 2021-03-14 DIAGNOSIS — C9000 Multiple myeloma not having achieved remission: Secondary | ICD-10-CM | POA: Diagnosis not present

## 2021-03-14 DIAGNOSIS — M545 Low back pain, unspecified: Secondary | ICD-10-CM | POA: Diagnosis not present

## 2021-03-14 DIAGNOSIS — S32020D Wedge compression fracture of second lumbar vertebra, subsequent encounter for fracture with routine healing: Secondary | ICD-10-CM | POA: Diagnosis not present

## 2021-03-14 MED ORDER — LIDOCAINE 4 % EX CREA: TOPICAL_CREAM | Freq: Three times a day (TID) | CUTANEOUS | Status: DC | PRN

## 2021-03-14 MED ORDER — LIDOCAINE 5 % EX OINT
TOPICAL_OINTMENT | Freq: Three times a day (TID) | CUTANEOUS | Status: DC | PRN
  Administered 2021-03-14: 1 via TOPICAL
  Filled 2021-03-14 (×3): qty 35.44

## 2021-03-14 NOTE — Evaluation (Signed)
Physical Therapy Evaluation Patient Details Name: Jack [name] MRN: [id number] DOB: [birth year] Today's Date: 03/14/2021   History of Present Illness  Jack [name] is an 87 y.o. male who is s/p L2-L3 kyphoplasty. PMH includes:  gastric reflux, AFIB, hypertension, hyperlipidemia, recent T12/L1 kyphoplasty, and possible CA (myeloma).   Clinical Impression  Pt admitted with above diagnosis. Pt received supine in bed agreeable to PT eval. Pt appears more energetic and reports feeling less pain and willing to participate in further rehab hopeful for OOB activity. RN intermittently in room to assist in giving pt's meds and don/dof new dressing from kyphoplasty. Pt re-educated on log roll technique. Pt displays good understanding with log roll to sitting EOB with HOB elevated and use of Ue's on handrails with increased time to perform. PT SBA with VC's to assist in hand placement and LE management. SPO2 and HR all WNL. MInA to STS to RW and minguard with ambulating 50' step to pattern with RW limited by pain. Pt returned to recliner with good understanding of RW sequencing and hand placement. Pt educated on scap retractions as postural exercises to assist in upright posture. Pt reports being fully independent and very active prior to last 3 months of hospitalizations. Pt well below baseline function relying on RW for support and ambulating short household distance at this time. Pt very motivated to improve in the rehab process indicating good prognosis if pain continues to become managed well as his pain has been his biggest limiting factor for OOB mobility. Pt currently with functional limitations due to the deficits listed below (see PT Problem List). Pt will benefit from skilled PT to increase their independence and safety with mobility to allow discharge to the venue listed below.      Follow Up Recommendations SNF    Equipment Recommendations  None recommended by PT (defer to next venue of care)     Recommendations for Other Services       Precautions / Restrictions Precautions Precautions: Fall Restrictions Weight Bearing Restrictions: No      Mobility  Bed Mobility Overal bed mobility: Needs Assistance Bed Mobility: Rolling;Supine to Sit Rolling: Supervision;Modified independent (Device/Increase time)   Supine to sit: Supervision;Modified independent (Device/Increase time)     General bed mobility comments: Pt dispalyed ability to log roll with good technique. HOB elevated and use of hand rails.    Transfers Overall transfer level: Needs assistance Equipment used: Rolling walker (2 wheeled) Transfers: Sit to/from Stand Sit to Stand: Min guard;From elevated surface         General transfer comment: Requires use of momentum to stand. Increased time due to pain.  Ambulation/Gait Ambulation/Gait assistance: Min guard Gait Distance (Feet): 50 Feet Assistive device: Rolling walker (2 wheeled) Gait Pattern/deviations: Step-to pattern;Narrow base of support;Trunk flexed     General Gait Details: VC's to improve upright posture and to maintian BLT precautions and for pt comfort. Heavy reliance on RW with UE's  Stairs            Wheelchair Mobility    Modified Rankin (Stroke Patients Only)       Balance Overall balance assessment: Needs assistance Sitting-balance support: Feet supported;Bilateral upper extremity supported Sitting balance-Leahy Scale: Fair       Standing balance-Leahy Scale: Fair Standing balance comment: ABle to perform R/L weight shifts and alternating marches  Pertinent Vitals/Pain Pain Assessment: 0-10 Pain Score: 5  Pain Location: L ribs in sitting. Up to 7/10 pain NPS in back with ambulation Pain Descriptors / Indicators: Aching;Sore Pain Intervention(s): Limited activity within patient's tolerance;Monitored during session;Repositioned    Home Living Family/patient expects to be  discharged to:: Private residence Living Arrangements: Spouse/significant other Available Help at Discharge: Family;Available 24 hours/day Type of Home: House Home Access: Stairs to enter Entrance Stairs-Rails: None Entrance Stairs-Number of Steps: 1 Home Layout: One level Home Equipment: Cane - single point;Shower seat;Grab bars - tub/shower;Hand held Tourist information centre manager - 2 wheels Additional Comments: lift chair/recliner    Prior Function Level of Independence: Independent with assistive device(s)         Comments: Prior to Late march 2022, pt reports he was totally independent with ADL/IADL management. Pt health has declined and he has required more assist with each subsequent hospitalization. Currently requires assist for most BADL tasks. Spouse provides assist in the home.     Hand Dominance   Dominant Hand: Right    Extremity/Trunk Assessment   Upper Extremity Assessment Upper Extremity Assessment: Generalized weakness    Lower Extremity Assessment Lower Extremity Assessment: Overall WFL for tasks assessed;Generalized weakness    Cervical / Trunk Assessment Cervical / Trunk Assessment: Kyphotic  Communication   Communication: No difficulties  Cognition Arousal/Alertness: Awake/alert Behavior During Therapy: WFL for tasks assessed/performed Overall Cognitive Status: Within Functional Limits for tasks assessed                                 General Comments: Pt able to tolerate OOB activity today due to lower pain levels and pt reports sleeping well.      General Comments      Exercises Other Exercises Other Exercises: Education on log roll and BLT precautions for pt comfort and safety Other Exercises: R/L weight shifts and alternating marches   Assessment/Plan    PT Assessment Patient needs continued PT services  PT Problem List Decreased strength;Decreased mobility;Decreased activity tolerance;Decreased balance;Pain       PT Treatment  Interventions DME instruction;Therapeutic exercise;Gait training;Balance training;Stair training;Neuromuscular re-education;Functional mobility training;Therapeutic activities;Patient/family education    PT Goals (Current goals can be found in the Care Plan section)  Acute Rehab PT Goals Patient Stated Goal: To have less pain. PT Goal Formulation: With patient Time For Goal Achievement: 03/28/21 Potential to Achieve Goals: Good    Frequency 7X/week   Barriers to discharge        Co-evaluation               AM-PAC PT "6 Clicks" Mobility  Outcome Measure Help needed turning from your back to your side while in a flat bed without using bedrails?: A Little Help needed moving from lying on your back to sitting on the side of a flat bed without using bedrails?: A Lot Help needed moving to and from a bed to a chair (including a wheelchair)?: A Little Help needed standing up from a chair using your arms (e.g., wheelchair or bedside chair)?: A Little Help needed to walk in hospital room?: A Little Help needed climbing 3-5 steps with a railing? : A Lot 6 Click Score: 16    End of Session Equipment Utilized During Treatment: Gait belt Activity Tolerance: Patient tolerated treatment well;Patient limited by pain Patient left: in chair;with call bell/phone within reach;with chair alarm set Nurse Communication: Mobility status PT Visit Diagnosis: Unsteadiness on feet (  R26.81);Other abnormalities of gait and mobility (R26.89);Muscle weakness (generalized) (M62.81);Difficulty in walking, not elsewhere classified (R26.2);Pain    Time: 0930-1008 PT Time Calculation (min) (ACUTE ONLY): 38 min   Charges:   PT Evaluation $PT Eval Moderate Complexity: 1 Mod PT Treatments $Therapeutic Activity: 23-37 mins       Jack [name] M. Fairly IV, PT, DPT Physical Therapist- [facility]  03/14/2021, 12:21 PM

## 2021-03-14 NOTE — Progress Notes (Addendum)
PROGRESS NOTE    Jack [name]   ZCH:[id number]  DOB: [birth year]  PCP: [name], MD    DOA: 03/11/2021 LOS: 2   Assessment & Plan   Principal Problem:   Compression fracture of L2 and L3 Active Problems:   Kappa light chain myeloma (HCC)   Multiple pathological fractures   Essential hypertension   Abdominal pain   Normocytic anemia   Hyperplasia, prostate   Back pain   Hyperlipidemia   CKD (chronic kidney disease), stage IV (HCC)   Constipation   Atrial fibrillation (HCC)   Back pain due to acute compression fractures of L2 and L3 -likely due to bone involvement of Multiple myeloma -Dr. Cari Caraway was consulted with neurosurgery who recommended kyphoplasty, Dr. Rudene Christians with orthopedic surgery who plan kyphoplasty of L2 and L3 on 03/12/21.  Bone marrow sent for biopsy. --Oncology and Palliative care consulted --[place] discussions ongoing --Continue pain control per orders, Decadron Excellent control on lowest dose OxyContin 15 mg BID --Monitor closely for side effects --Lidocaine cream for rib cage pain --Continue baclofen --resume Eliquis (after procedure) --Per oncology: will need PET scan outpatient  Abdominal pain -reported on admission but patient no longer complaining of this.  CT abdomen and pelvis was negative for anything acute to explain it.  Calcium level normal.  Monitor clinically.  Atrial fibrillation -rate controlled.  Continue Coreg.  Hold Eliquis for procedure as above.  CKD stage IV -renal function at baseline.  Monitor BMP.  Normocytic anemia possibly secondary to multiple myeloma -Dr. Grayland Ormond with oncology is consulted.  Follow-up bone biopsy, to be obtained during high [place]  Essential hypertension -continue Coreg.  IV hydralazine as needed.  BPH -continue Flomax  Hyperlipidemia -continue Lipitor  Constipation -bowel regimen per orders.   Patient BMI: Body mass index is 26.58 kg/m.   DVT prophylaxis: apixaban (ELIQUIS) tablet 2.5 mg  Start: 03/13/21 2200 Place and maintain sequential compression device Start: 03/11/21 1513 apixaban (ELIQUIS) tablet 2.5 mg   Diet:  Diet Orders (From admission, onward)     Start     Ordered   03/12/21 2127  Diet regular Room service appropriate? Yes; Fluid consistency: Thin  Diet effective now       Question Answer Comment  Room service appropriate? Yes   Fluid consistency: Thin      03/12/21 2126              Code Status: Full Code   Brief Narrative / Hospital Course to Date:   87 y.o. male with medical history significant of HTN, HLD, CKD-IV, GERD, BPH, anemia, a fib on eliquis, ureteral stone, compression fracture of L1 and T12-spine (s/p recent kyphoplasty), who presents with intractable back pain and abdominal pain.  He is currently undergoing evaluation for multiple myeloma with Dr. Grayland Ormond.    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    Subjective 03/14/21    Patient up in recliner.  Feeling much better, pain controlled.  Says he slept better than in months.  He is very pleased to have relief. Still tender in his rib cage.  Worries about physical demands at rehab and his weak bones.     Disposition Plan & Communication   Status is: Inpatient  Remains inpatient appropriate because: requires SNF placement for rehab; monitoring for adequate pain control / side effects.  Dispo: The patient is from:  Home              Anticipated d/c is to: Home              Patient currently is not medically stable to d/c.   Difficult to place patient No   Consults, Procedures, Significant Events   Consultants:  Oncology Palliative care Neurosurgery Orthopedic surgery  Procedures:  Kyphoplasty 03/12/2021  Antimicrobials:  Anti-infectives (From admission, onward)    Start     Dose/Rate Route Frequency Ordered  Stop   03/12/21 1900  ceFAZolin (ANCEF) IVPB 2g/100 mL premix        2 g 200 mL/hr over 30 Minutes Intravenous 60 min pre-op 03/11/21 1757     03/12/21 1533  ceFAZolin (ANCEF) 2-4 GM/100ML-% IVPB       Note to Pharmacy: Norton Blizzard  : cabinet override      03/12/21 1533 03/13/21 0344         Micro    Objective   Vitals:   03/13/21 1932 03/14/21 0423 03/14/21 0721 03/14/21 1535  BP: 118/74 127/68 125/82 133/76  Pulse: 71 64 67 75  Resp: _0 Temp: 97.7 F (36.5 C) 98.1 F (36.7 C) 97.7 F (36.5 C) 98.6 F (37 C)  TempSrc:      SpO2: 92% 96% (!) 85% 98%  Weight:      Height:        Intake/Output Summary (Last 24 hours) at 03/14/2021 1907 Last data filed at 03/14/2021 1842 Gross per 24 hour  Intake 480 ml  Output --  Net 480 ml   Filed Weights   03/11/21 0834  Weight: 81.6 kg    Physical Exam:  General exam: good spirits, up in recliner, awake, no acute distress Respiratory system: normal respiratory effort, on room air, clear. Cardiovascular system: RRR, no pedal edema.   Central nervous system: A&O x3. no gross focal neurologic deficits, normal speech Extremities/MSK: tender anterior lower/lateral rib cage, moves all extremities, normal tone Psychiatry: normal mood, congruent affect, judgement and insight appear normal  Labs   Data Reviewed: I have personally reviewed following labs and imaging studies  CBC: Recent Labs  Lab 03/11/21 0829 03/12/21 0730  WBC 4.2 3.4*  NEUTROABS 2.6  --   HGB 11.9* 11.5*  HCT 35.4* 34.3*  MCV 92.4 91.7  PLT 244 625   Basic Metabolic Panel: Recent Labs  Lab 03/11/21 0829 03/12/21 0730  NA 136 140  K 3.7 3.7  CL 101 104  CO2 20* 26  GLUCOSE 131* 114*  BUN 16 15  CREATININE 1.48* 1.21  CALCIUM 10.0 9.8   GFR: Estimated Creatinine Clearance: 44.6 mL/min (by C-G formula based on SCr of 1.21 mg/dL). Liver Function Tests: Recent Labs  Lab 03/11/21 0829  AST 23  ALT 8  ALKPHOS 149*  BILITOT  1.0  PROT 6.7  ALBUMIN 4.1   Recent Labs  Lab 03/11/21 0829  LIPASE 30   No results for input(s): AMMONIA in the last 168 hours. Coagulation Profile: Recent Labs  Lab 03/11/21 1957  INR 1.2   Cardiac Enzymes: No results for input(s): CKTOTAL, CKMB, CKMBINDEX, TROPONINI in the last 168 hours. BNP (last 3 results) No results for input(s): PROBNP in the last 8760 hours. HbA1C: No results for input(s): HGBA1C in the last 72 hours. CBG: No results for input(s): GLUCAP in the last 168 hours. Lipid Profile: No results for input(s): CHOL, HDL, LDLCALC, TRIG, CHOLHDL, LDLDIRECT in the last 72 hours. Thyroid  Function Tests: No results for input(s): TSH, T4TOTAL, FREET4, T3FREE, THYROIDAB in the last 72 hours. Anemia Panel: No results for input(s): VITAMINB12, FOLATE, FERRITIN, TIBC, IRON, RETICCTPCT in the last 72 hours. Sepsis Labs: No results for input(s): PROCALCITON, LATICACIDVEN in the last 168 hours.  Recent Results (from the past 240 hour(s))  Resp Panel by RT-PCR (Flu A&B, Covid) Nasopharyngeal Swab     Status: None   Collection Time: 03/11/21 11:36 AM   Specimen: Nasopharyngeal Swab; Nasopharyngeal(NP) swabs in vial transport medium  Result Value Ref Range Status   SARS Coronavirus 2 by RT PCR NEGATIVE NEGATIVE Final    Comment: (NOTE) SARS-CoV-2 target nucleic acids are NOT DETECTED.  The SARS-CoV-2 RNA is generally detectable in upper respiratory specimens during the acute phase of infection. The lowest concentration of SARS-CoV-2 viral copies this assay can detect is 138 copies/mL. A negative result does not preclude SARS-Cov-2 infection and should not be used as the sole basis for treatment or other patient management decisions. A negative result may occur with  improper specimen collection/handling, submission of specimen other than nasopharyngeal swab, presence of viral mutation(s) within the areas targeted by this assay, and inadequate number of  viral copies(<138 copies/mL). A negative result must be combined with clinical observations, patient history, and epidemiological information. The expected result is Negative.  Fact Sheet for Patients:  EntrepreneurPulse.com.au  Fact Sheet for Healthcare Providers:  IncredibleEmployment.be  This test is no t yet approved or cleared by the [place] FDA and  has been authorized for detection and/or diagnosis of SARS-CoV-2 by FDA under an Emergency Use Authorization (EUA). This EUA will remain  in effect (meaning this test can be used) for the duration of the COVID-19 declaration under Section 564(b)(1) of the Act, 21 U.S.C.section 360bbb-3(b)(1), unless the authorization is terminated  or revoked sooner.       Influenza A by PCR NEGATIVE NEGATIVE Final   Influenza B by PCR NEGATIVE NEGATIVE Final    Comment: (NOTE) The Xpert Xpress SARS-CoV-2/FLU/RSV plus assay is intended as an aid in the diagnosis of influenza from Nasopharyngeal swab specimens and should not be used as a sole basis for treatment. Nasal washings and aspirates are unacceptable for Xpert Xpress SARS-CoV-2/FLU/RSV testing.  Fact Sheet for Patients: EntrepreneurPulse.com.au  Fact Sheet for Healthcare Providers: IncredibleEmployment.be  This test is not yet approved or cleared by the [place] FDA and has been authorized for detection and/or diagnosis of SARS-CoV-2 by FDA under an Emergency Use Authorization (EUA). This EUA will remain in effect (meaning this test can be used) for the duration of the COVID-19 declaration under Section 564(b)(1) of the Act, 21 U.S.C. section 360bbb-3(b)(1), unless the authorization is terminated or revoked.  Performed at [facility], [address] [place], [place] [address]       Imaging Studies   No results found.   Medications   Scheduled Meds:  acetaminophen  1,000 mg  Oral Q8H   apixaban  2.5 mg Oral BID   atorvastatin  40 mg Oral q1800   baclofen  10 mg Oral TID   calcitonin (salmon)  1 spray Alternating Nares Daily   carvedilol  25 mg Oral BID WC   dexamethasone  4 mg Oral Q12H   gabapentin  100 mg Oral TID   multivitamin  1 tablet Oral QHS   oxyCODONE  15 mg Oral Q12H   polyethylene glycol  17 g Oral Daily   senna  1 tablet Oral Daily   Continuous  Infusions:   ceFAZolin (ANCEF) IV     promethazine (PHENERGAN) injection (IM or IVPB)         LOS: 2 days    Time spent: 25 minutes with > 50% spent at bedside and in coordination of care.    Ezekiel Slocumb, DO [facility]  03/14/2021, 7:07 PM      If 7PM-7AM, please contact night-coverage. How to contact the [facility] Attending or Consulting provider [place] or covering provider during after hours [place], for this patient?    Check the care team in [facility] and look for a) attending/consulting TRH provider listed and b) the [facility] team listed Log into [email or web address] and use [place]'s universal password to access. If you do not have the password, please contact the hospital operator. Locate the [facility] provider you are looking for under [facility] and page to a number that you can be directly reached. If you still have difficulty reaching the provider, please page the [facility] (Director on Call) for the Hospitalists listed on amion for assistance.

## 2021-03-15 DIAGNOSIS — S32020D Wedge compression fracture of second lumbar vertebra, subsequent encounter for fracture with routine healing: Secondary | ICD-10-CM | POA: Diagnosis not present

## 2021-03-15 DIAGNOSIS — C9000 Multiple myeloma not having achieved remission: Secondary | ICD-10-CM | POA: Diagnosis not present

## 2021-03-15 DIAGNOSIS — R109 Unspecified abdominal pain: Secondary | ICD-10-CM | POA: Diagnosis not present

## 2021-03-15 DIAGNOSIS — I10 Essential (primary) hypertension: Secondary | ICD-10-CM | POA: Diagnosis not present

## 2021-03-15 MED ORDER — BISACODYL 10 MG RE SUPP: 10.0000 mg | Freq: Every day | RECTAL | Status: DC | PRN

## 2021-03-15 MED ORDER — SENNOSIDES-DOCUSATE SODIUM 8.6-50 MG PO TABS
1.0000 | ORAL_TABLET | Freq: Two times a day (BID) | ORAL | Status: DC
  Administered 2021-03-15 – 2021-03-17 (×4): 1 via ORAL
  Filled 2021-03-15 (×5): qty 1

## 2021-03-15 MED ORDER — FLEET ENEMA 7-19 GM/118ML RE ENEM: 1.0000 | ENEMA | Freq: Every day | RECTAL | Status: DC | PRN

## 2021-03-15 MED ORDER — BISACODYL 5 MG PO TBEC
5.0000 mg | DELAYED_RELEASE_TABLET | Freq: Every day | ORAL | Status: DC | PRN
  Administered 2021-03-15: 5 mg via ORAL
  Filled 2021-03-15: qty 1

## 2021-03-15 MED ORDER — MAGNESIUM CITRATE PO SOLN
1.0000 | Freq: Once | ORAL | Status: AC
  Administered 2021-03-15: 1 via ORAL
  Filled 2021-03-15: qty 296

## 2021-03-15 NOTE — Progress Notes (Addendum)
PROGRESS NOTE    Jack [name]   ONG:[id number]  DOB: 20-Oct-1932  PCP: [name], MD    DOA: 03/11/2021 LOS: 3   Assessment & Plan   Principal Problem:   Compression fracture of L2 and L3 Active Problems:   Kappa light chain myeloma (HCC)   Multiple pathological fractures   Essential hypertension   Abdominal pain   Normocytic anemia   Hyperplasia, prostate   Back pain   Hyperlipidemia   CKD (chronic kidney disease), stage IV (HCC)   Constipation   Atrial fibrillation (HCC)   Back pain due to acute compression fractures of L2 and L3 -likely due to bone involvement of Multiple myeloma -Dr. Cari Caraway was consulted with neurosurgery who recommended kyphoplasty, Dr. Rudene Christians with orthopedic surgery who plan kyphoplasty of L2 and L3 on 03/12/21.  Bone marrow sent for biopsy. --Oncology and Palliative care consulted --[place] discussions ongoing --Continue pain control per orders, Decadron Excellent control on lowest dose OxyContin 15 mg BID --Monitor closely for side effects --Lidocaine cream for rib cage pain --Continue baclofen --resume Eliquis (after procedure) --Per oncology: will need PET scan outpatient  Severe constipation - aggressive bowel regimen per orders.  Try suppository and/or enema today if no result with mag citrate.   Abdominal pain -reported on admission, was better but now progressive, due to constipation.   CT abdomen and pelvis was negative for anything acute to explain it.  Calcium level normal.   Monitor clinically. Bowel regimen.  PRN antiemetics.  Atrial fibrillation -rate controlled.  Continue Coreg.  Hold Eliquis for procedure as above.  CKD stage IV -renal function at baseline.  Monitor BMP.  Normocytic anemia possibly secondary to multiple myeloma -Dr. Grayland Ormond with oncology is consulted.  Follow-up bone biopsy, to be obtained during high [place]  Essential hypertension -continue Coreg.  IV hydralazine as needed.  BPH -continue  Flomax  Hyperlipidemia -continue Lipitor  Constipation -bowel regimen per orders.   Patient BMI: Body mass index is 26.58 kg/m.   DVT prophylaxis: apixaban (ELIQUIS) tablet 2.5 mg Start: 03/13/21 2200 Place and maintain sequential compression device Start: 03/11/21 1513 apixaban (ELIQUIS) tablet 2.5 mg   Diet:  Diet Orders (From admission, onward)     Start     Ordered   03/12/21 2127  Diet regular Room service appropriate? Yes; Fluid consistency: Thin  Diet effective now       Question Answer Comment  Room service appropriate? Yes   Fluid consistency: Thin      03/12/21 2126              Code Status: Full Code   Brief Narrative / Hospital Course to Date:   87 y.o. male with medical history significant of HTN, HLD, CKD-IV, GERD, BPH, anemia, a fib on eliquis, ureteral stone, compression fracture of L1 and T12-spine (s/p recent kyphoplasty), who presents with intractable back pain and abdominal pain.  He is currently undergoing evaluation for multiple myeloma with Dr. Grayland Ormond.    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    Subjective 03/15/21    Patient severe constipated, last BM was 6/25.  Abdomen very distended and uncomfortable, worsening his rib cage pain.  Trying mag citrate again today, no result w it yesterday.   Disposition Plan & Communication   Status is: Inpatient  Remains inpatient appropriate because: requires SNF placement for rehab;  monitoring for adequate pain control / side effects. Severe constipation.  Dispo: The patient is from: Home              Anticipated d/c is to: Home              Patient currently is not medically stable to d/c.   Difficult to place patient No   Consults, Procedures, Significant Events   Consultants:  Oncology Palliative care Neurosurgery Orthopedic  surgery  Procedures:  Kyphoplasty 03/12/2021  Antimicrobials:  Anti-infectives (From admission, onward)    Start     Dose/Rate Route Frequency Ordered Stop   03/12/21 1900  ceFAZolin (ANCEF) IVPB 2g/100 mL premix        2 g 200 mL/hr over 30 Minutes Intravenous 60 min pre-op 03/11/21 1757     03/12/21 1533  ceFAZolin (ANCEF) 2-4 GM/100ML-% IVPB       Note to Pharmacy: Norton Blizzard  : cabinet override      03/12/21 1533 03/13/21 0344         Micro    Objective   Vitals:   03/14/21 1932 03/15/21 0411 03/15/21 0737 03/15/21 1523  BP: 131/80 121/75 (!) 162/82 123/73  Pulse: 77 (!) 55 (!) 50 (!) 56  Resp: _0 Temp: 97.7 F (36.5 C) 98.5 F (36.9 C) (!) 97.5 F (36.4 C) 98 F (36.7 C)  TempSrc:      SpO2: 97% 99% 95% 98%  Weight:      Height:        Intake/Output Summary (Last 24 hours) at 03/15/2021 1657 Last data filed at 03/15/2021 0410 Gross per 24 hour  Intake 240 ml  Output 200 ml  Net 40 ml   Filed Weights   03/11/21 0834  Weight: 81.6 kg    Physical Exam:  General exam: appears unwell, uncomfortable, up in recliner, awake, up in recliner Respiratory system: normal respiratory effort, on room air Cardiovascular system: RRR, no pedal edema.   Central nervous system: A&O x3. Normal speech, CN's grossly intact Extremities: no peripheral edema, normal tone Psychiatry: normal mood, congruent affect, judgement and insight appear normal  Labs   Data Reviewed: I have personally reviewed following labs and imaging studies  CBC: Recent Labs  Lab 03/11/21 0829 03/12/21 0730  WBC 4.2 3.4*  NEUTROABS 2.6  --   HGB 11.9* 11.5*  HCT 35.4* 34.3*  MCV 92.4 91.7  PLT 244 009   Basic Metabolic Panel: Recent Labs  Lab 03/11/21 0829 03/12/21 0730  NA 136 140  K 3.7 3.7  CL 101 104  CO2 20* 26  GLUCOSE 131* 114*  BUN 16 15  CREATININE 1.48* 1.21  CALCIUM 10.0 9.8   GFR: Estimated Creatinine Clearance: 44.6 mL/min (by C-G formula based  on SCr of 1.21 mg/dL). Liver Function Tests: Recent Labs  Lab 03/11/21 0829  AST 23  ALT 8  ALKPHOS 149*  BILITOT 1.0  PROT 6.7  ALBUMIN 4.1   Recent Labs  Lab 03/11/21 0829  LIPASE 30   No results for input(s): AMMONIA in the last 168 hours. Coagulation Profile: Recent Labs  Lab 03/11/21 1957  INR 1.2   Cardiac Enzymes: No results for input(s): CKTOTAL, CKMB, CKMBINDEX, TROPONINI in the last 168 hours. BNP (last 3 results) No results for input(s): PROBNP in the last 8760 hours. HbA1C: No results for input(s): HGBA1C in the last 72 hours. CBG: No results for input(s): GLUCAP in the last 168 hours. Lipid Profile: No results  for input(s): CHOL, HDL, LDLCALC, TRIG, CHOLHDL, LDLDIRECT in the last 72 hours. Thyroid Function Tests: No results for input(s): TSH, T4TOTAL, FREET4, T3FREE, THYROIDAB in the last 72 hours. Anemia Panel: No results for input(s): VITAMINB12, FOLATE, FERRITIN, TIBC, IRON, RETICCTPCT in the last 72 hours. Sepsis Labs: No results for input(s): PROCALCITON, LATICACIDVEN in the last 168 hours.  Recent Results (from the past 240 hour(s))  Resp Panel by RT-PCR (Flu A&B, Covid) Nasopharyngeal Swab     Status: None   Collection Time: 03/11/21 11:36 AM   Specimen: Nasopharyngeal Swab; Nasopharyngeal(NP) swabs in vial transport medium  Result Value Ref Range Status   SARS Coronavirus 2 by RT PCR NEGATIVE NEGATIVE Final    Comment: (NOTE) SARS-CoV-2 target nucleic acids are NOT DETECTED.  The SARS-CoV-2 RNA is generally detectable in upper respiratory specimens during the acute phase of infection. The lowest concentration of SARS-CoV-2 viral copies this assay can detect is 138 copies/mL. A negative result does not preclude SARS-Cov-2 infection and should not be used as the sole basis for treatment or other patient management decisions. A negative result may occur with  improper specimen collection/handling, submission of specimen other than  nasopharyngeal swab, presence of viral mutation(s) within the areas targeted by this assay, and inadequate number of viral copies(<138 copies/mL). A negative result must be combined with clinical observations, patient history, and epidemiological information. The expected result is Negative.  Fact Sheet for Patients:  EntrepreneurPulse.com.au  Fact Sheet for Healthcare Providers:  IncredibleEmployment.be  This test is no t yet approved or cleared by the [place] FDA and  has been authorized for detection and/or diagnosis of SARS-CoV-2 by FDA under an Emergency Use Authorization (EUA). This EUA will remain  in effect (meaning this test can be used) for the duration of the COVID-19 declaration under Section 564(b)(1) of the Act, 21 U.S.C.section 360bbb-3(b)(1), unless the authorization is terminated  or revoked sooner.       Influenza A by PCR NEGATIVE NEGATIVE Final   Influenza B by PCR NEGATIVE NEGATIVE Final    Comment: (NOTE) The Xpert Xpress SARS-CoV-2/FLU/RSV plus assay is intended as an aid in the diagnosis of influenza from Nasopharyngeal swab specimens and should not be used as a sole basis for treatment. Nasal washings and aspirates are unacceptable for Xpert Xpress SARS-CoV-2/FLU/RSV testing.  Fact Sheet for Patients: EntrepreneurPulse.com.au  Fact Sheet for Healthcare Providers: IncredibleEmployment.be  This test is not yet approved or cleared by the [place] FDA and has been authorized for detection and/or diagnosis of SARS-CoV-2 by FDA under an Emergency Use Authorization (EUA). This EUA will remain in effect (meaning this test can be used) for the duration of the COVID-19 declaration under Section 564(b)(1) of the Act, 21 U.S.C. section 360bbb-3(b)(1), unless the authorization is terminated or revoked.  Performed at [facility], [address], [place], [place]  [address]       Imaging Studies   No results found.   Medications   Scheduled Meds:  acetaminophen  1,000 mg Oral Q8H   apixaban  2.5 mg Oral BID   atorvastatin  40 mg Oral q1800   baclofen  10 mg Oral TID   calcitonin (salmon)  1 spray Alternating Nares Daily   carvedilol  25 mg Oral BID WC   dexamethasone  4 mg Oral Q12H   gabapentin  100 mg Oral TID   multivitamin  1 tablet Oral QHS   oxyCODONE  15 mg Oral Q12H   polyethylene glycol  17  g Oral Daily   senna-docusate  1 tablet Oral BID   Continuous Infusions:   ceFAZolin (ANCEF) IV     promethazine (PHENERGAN) injection (IM or IVPB)         LOS: 3 days    Time spent: 25 minutes with > 50% spent at bedside and in coordination of care.    Ezekiel Slocumb, DO [facility]  03/15/2021, 4:57 PM      If 7PM-7AM, please contact night-coverage. How to contact the [facility] Attending or Consulting provider [place] or covering provider during after hours [place], for this patient?    Check the care team in [facility] and look for a) attending/consulting TRH provider listed and b) the [facility] team listed Log into [email or web address] and use [place]'s universal password to access. If you do not have the password, please contact the hospital operator. Locate the [facility] provider you are looking for under [facility] and page to a number that you can be directly reached. If you still have difficulty reaching the provider, please page the [facility] (Director on Call) for the Hospitalists listed on amion for assistance.

## 2021-03-15 NOTE — Progress Notes (Signed)
Physical Therapy Treatment Patient Details Name: Jack [name] MRN: [id number] DOB: 1933/03/09 Today's Date: 03/15/2021    History of Present Illness Jack [name] is an 87 y.o. male who is s/p L2-L3 kyphoplasty. PMH includes:  gastric reflux, AFIB, hypertension, hyperlipidemia, recent T12/L1 kyphoplasty, and possible CA (myeloma).    PT Comments    Pt ready for session.  To EOB with good roll technique and rail and min guard/supervision.  Steady in sitting.  He is able to walk 120' with RW and min guard/assist +1.  Initially a bit unsteady but gait improves with time/distance.  Heavy use on RW but no buckling.  Remained in recliner after session with needs met.  He does c/o abdominal discomfort.  RN in to address need for BM.    Follow Up Recommendations  SNF     Equipment Recommendations  None recommended by PT    Recommendations for Other Services       Precautions / Restrictions Precautions Precautions: Fall Restrictions Weight Bearing Restrictions: No    Mobility  Bed Mobility Overal bed mobility: Needs Assistance Bed Mobility: Rolling;Sidelying to Sit Rolling: Supervision Sidelying to sit: Supervision       General bed mobility comments: slow but overall does well today    Transfers Overall transfer level: Needs assistance Equipment used: Rolling walker (2 wheeled) Transfers: Sit to/from Stand Sit to Stand: Min guard;From elevated surface            Ambulation/Gait Ambulation/Gait assistance: Min guard;Min assist Gait Distance (Feet): 120 Feet Assistive device: Rolling walker (2 wheeled) Gait Pattern/deviations: Step-to pattern;Narrow base of support;Trunk flexed Gait velocity: decreased   General Gait Details: VC's to improve upright posture and to maintian BLT precautions and for pt comfort. Heavy reliance on RW with UE's   Stairs             Wheelchair Mobility    Modified Rankin (Stroke Patients Only)       Balance Overall  balance assessment: Needs assistance Sitting-balance support: Feet supported;Bilateral upper extremity supported Sitting balance-Leahy Scale: Fair     Standing balance support: Bilateral upper extremity supported Standing balance-Leahy Scale: Fair                              Cognition Arousal/Alertness: Awake/alert Behavior During Therapy: WFL for tasks assessed/performed Overall Cognitive Status: Within Functional Limits for tasks assessed                                        Exercises      General Comments        Pertinent Vitals/Pain Pain Assessment: Faces Faces Pain Scale: Hurts even more Pain Location: mostly adbominal from need to have BM.  RN in to give meds to help during session. Pain Descriptors / Indicators: Sore Pain Intervention(s): Monitored during session;Repositioned    Home Living                      Prior Function            PT Goals (current goals can now be found in the care plan section) Progress towards PT goals: Progressing toward goals    Frequency    7X/week      PT Plan Current plan remains appropriate    Co-evaluation  AM-PAC PT "6 Clicks" Mobility   Outcome Measure  Help needed turning from your back to your side while in a flat bed without using bedrails?: A Little Help needed moving from lying on your back to sitting on the side of a flat bed without using bedrails?: A Little Help needed moving to and from a bed to a chair (including a wheelchair)?: A Little Help needed standing up from a chair using your arms (e.g., wheelchair or bedside chair)?: A Little Help needed to walk in hospital room?: A Little Help needed climbing 3-5 steps with a railing? : A Lot 6 Click Score: 17    End of Session Equipment Utilized During Treatment: Gait belt Activity Tolerance: Patient tolerated treatment well Patient left: in chair;with call bell/phone within reach;with chair alarm  set;with nursing/sitter in room Nurse Communication: Mobility status PT Visit Diagnosis: Other abnormalities of gait and mobility (R26.89);Muscle weakness (generalized) (M62.81);Difficulty in walking, not elsewhere classified (R26.2);Pain;Unsteadiness on feet (R26.81)     Time: 4996-9249 PT Time Calculation (min) (ACUTE ONLY): 16 min  Charges:  $Gait Training: 8-22 mins                    Chesley Noon, PTA 03/15/21, 1:21 PM , 1:19 PM

## 2021-03-16 DIAGNOSIS — M8440XS Pathological fracture, unspecified site, sequela: Secondary | ICD-10-CM | POA: Diagnosis not present

## 2021-03-16 DIAGNOSIS — C9000 Multiple myeloma not having achieved remission: Secondary | ICD-10-CM | POA: Diagnosis not present

## 2021-03-16 DIAGNOSIS — S32020D Wedge compression fracture of second lumbar vertebra, subsequent encounter for fracture with routine healing: Secondary | ICD-10-CM | POA: Diagnosis not present

## 2021-03-16 NOTE — Progress Notes (Signed)
Physical Therapy Treatment Patient Details Name: Jack [name] MRN: [id number] DOB: 1933/09/05 Today's Date: 03/16/2021    History of Present Illness Jack [name] is an 87 y.o. male who is s/p L2-L3 kyphoplasty. PMH includes:  gastric reflux, AFIB, hypertension, hyperlipidemia, recent T12/L1 kyphoplasty, and possible CA (myeloma).    PT Comments    Patient is in bed upon PT arrival agreeable to participate in therapy. Educated on and given handout on safe bed mobility and exercises including LE strengthening and TrA activation techniques. Patient is able to safely transition EOB and into sitting with additional time utilizing safe log roll technique. His sit to stand was unsteady however and required close CGA. Patient ambulated with RW and cueing from PT to widen BOS as he frequently scissors gait pattern and sways to the left. Patient returned to room, sat in chair and performed seated strengthening interventions. Alarms set with needs met. Current POC remains appropriate at this time.     Follow Up Recommendations  SNF     Equipment Recommendations  None recommended by PT    Recommendations for Other Services       Precautions / Restrictions Precautions Precautions: Fall Restrictions Weight Bearing Restrictions: No    Mobility  Bed Mobility Overal bed mobility: Modified Independent Bed Mobility: Supine to Sit Rolling: Supervision Sidelying to sit: Supervision Supine to sit: Supervision;Modified independent (Device/Increase time)     General bed mobility comments: slow ;safe log roll    Transfers Overall transfer level: Needs assistance Equipment used: Rolling walker (2 wheeled) Transfers: Sit to/from Stand Sit to Stand: Min guard;From elevated surface;Supervision         General transfer comment: cues for safe body mechanics and to breath  Ambulation/Gait Ambulation/Gait assistance: Min guard Gait Distance (Feet): 160 Feet Assistive device: Rolling  walker (2 wheeled) Gait Pattern/deviations: Narrow base of support;Trunk flexed;Step-through pattern;Scissoring Gait velocity: decreased   General Gait Details: Frequent scissor stepping and occasional sway to left. Heavy reliance upon UE's for support.   Stairs             Wheelchair Mobility    Modified Rankin (Stroke Patients Only)       Balance Overall balance assessment: Needs assistance Sitting-balance support: Feet supported;Bilateral upper extremity supported Sitting balance-Leahy Scale: Fair     Standing balance support: Bilateral upper extremity supported Standing balance-Leahy Scale: Fair                              Cognition Arousal/Alertness: Awake/alert Behavior During Therapy: WFL for tasks assessed/performed Overall Cognitive Status: Within Functional Limits for tasks assessed                                 General Comments: patient eager to get out of bed      Exercises General Exercises - Lower Extremity Ankle Circles/Pumps: Strengthening;Both;10 reps;Supine;Seated Other Exercises Other Exercises: Education on log roll and BLT precautions for pt comfort and safety Other Exercises: supine: adduction squeeze with pillow, heel/toe raises 10x; seated: LAQ 10x each LE cue for quad activation and hold, 10x march, 10x alternate heel toe raise    General Comments        Pertinent Vitals/Pain Pain Assessment: Faces Faces Pain Scale: Hurts little more Pain Location: L rib and back Pain Descriptors / Indicators: Discomfort;Squeezing Pain Intervention(s): Monitored during session;Repositioned    Home Living  Prior Function            PT Goals (current goals can now be found in the care plan section) Acute Rehab PT Goals Patient Stated Goal: To have less pain. PT Goal Formulation: With patient Time For Goal Achievement: 03/28/21 Potential to Achieve Goals: Good Progress towards PT  goals: Progressing toward goals    Frequency    7X/week      PT Plan Current plan remains appropriate    Co-evaluation              AM-PAC PT "6 Clicks" Mobility   Outcome Measure  Help needed turning from your back to your side while in a flat bed without using bedrails?: A Little Help needed moving from lying on your back to sitting on the side of a flat bed without using bedrails?: A Little Help needed moving to and from a bed to a chair (including a wheelchair)?: A Little Help needed standing up from a chair using your arms (e.g., wheelchair or bedside chair)?: A Little Help needed to walk in hospital room?: A Little Help needed climbing 3-5 steps with a railing? : A Lot 6 Click Score: 17    End of Session Equipment Utilized During Treatment: Gait belt Activity Tolerance: Patient tolerated treatment well Patient left: in chair;with call bell/phone within reach;with chair alarm set Nurse Communication: Mobility status PT Visit Diagnosis: Other abnormalities of gait and mobility (R26.89);Muscle weakness (generalized) (M62.81);Difficulty in walking, not elsewhere classified (R26.2);Pain;Unsteadiness on feet (R26.81)     Time: 5488-4573 PT Time Calculation (min) (ACUTE ONLY): 29 min  Charges:  $Therapeutic Exercise: 8-22 mins $Therapeutic Activity: 8-22 mins                     Janna Arch, PT, DPT  03/16/2021, 2:48 PM

## 2021-03-16 NOTE — NC FL2 (Signed)
[place] LEVEL OF CARE SCREENING TOOL     IDENTIFICATION  Patient Name: Jack [name] Birthdate: 1932-10-05 Sex: male Admission Date (Current Location): 03/11/2021  [facility] and [place] Number:  Engineering geologist and Address:  [facility], [address] [place], [place] [address]      Provider Number: [id number]  Attending Physician Name and Address:  Ezekiel Slocumb, DO  Relative Name and Phone Number:  Eshan, Trupiano (Spouse)   [phone] (Mobile)    Current Level of Care: SNF Recommended Level of Care: [place] Prior Approval Number:    Date Approved/Denied:   PASRR Number: [id number]  Discharge Plan: SNF    Current Diagnoses: Patient Active Problem List   Diagnosis Date Noted   Multiple pathological fractures 03/14/2021   Kappa light chain myeloma ([place])    Back pain 03/11/2021   Atrial fibrillation ([place]) 03/11/2021   Compression fracture of L2 and L3 03/11/2021   Hyperlipidemia    CKD (chronic kidney disease), stage IV ([place])    Constipation    Palliative care encounter    Actinic keratosis 02/19/2021   Inguinal hernia 02/19/2021   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   Numbness of foot 02/19/2021   Sciatica 02/19/2021   Shoulder pain 02/19/2021   Palliative care patient 02/17/2021   Closed compression fracture of body of L1 vertebra ([place]) 02/07/2021   Protein-calorie malnutrition, severe 01/31/2021   Hypokalemia    Normocytic anemia    Atherosclerosis of aorta (HCC)    Irregular heart rhythm    T12 compression fracture ([place]) 01/02/2021   History of kidney stones    Acute kidney injury superimposed on CKD ([place])    Ureteral stone    Ureteral perforation secondary to stent manipulation 10/16/2017   Abdominal pain 10/16/2017   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/26/2016   Hyperplasia, prostate 02/03/2015   Multinodular goiter 07/03/2014   Thyroid nodule 06/20/2014    Nontoxic uninodular goiter 06/20/2014   Edema 09/24/2013   Encounter for preventive health examination 08/21/2013   Personal history of colonic polyps 08/21/2013   History of venomous snake bite 08/21/2013   Cough 10/02/2012   Prostate hypertrophy    Elevated prostate specific antigen (PSA)    HYPERTRIGLYCERIDEMIA 12/21/2006   Essential hypertension 12/21/2006   GERD 12/21/2006   CALCULUS, KIDNEY 12/21/2006    Orientation RESPIRATION BLADDER Height & Weight     Self, Time, Situation, Place  Normal External catheter Weight: 81.6 kg Height:  5\' 9"  (175.3 cm)  BEHAVIORAL SYMPTOMS/MOOD NEUROLOGICAL BOWEL NUTRITION STATUS      Continent Diet (Regular)  AMBULATORY STATUS COMMUNICATION OF NEEDS Skin   Limited Assist Verbally Surgical wounds (Back adhesive bandage)                       Personal Care Assistance Level of Assistance  Bathing, Feeding, Dressing Bathing Assistance: Limited assistance Feeding assistance: Independent Dressing Assistance: Limited assistance     Functional Limitations Info  Sight, Hearing, Speech Sight Info: Impaired Hearing Info: Impaired Speech Info: Adequate    SPECIAL CARE FACTORS FREQUENCY  PT (By licensed PT), OT (By licensed OT)     PT Frequency: 5x weekly OT Frequency: 5x weekly            Contractures Contractures Info: Not present    Additional Factors Info  Code Status, Allergies Code Status Info: FULL CODE Allergies Info: Quinolones, Amlodipine,  Azithromycin, Lisinopril, Morphine And Related  Current Medications (03/16/2021):  This is the current hospital active medication list Current Facility-Administered Medications  Medication Dose Route Frequency Provider Last Rate Last Admin   acetaminophen (TYLENOL) tablet 1,000 mg  1,000 mg Oral Q8H Griffith, Kelly A, DO   1,000 mg at 03/16/21 0531   apixaban (ELIQUIS) tablet 2.5 mg  2.5 mg Oral BID Hessie Knows, MD   2.5 mg at 03/16/21 0850   atorvastatin (LIPITOR)  tablet 40 mg  40 mg Oral q1800 Hessie Knows, MD   40 mg at 03/15/21 1758   baclofen (LIORESAL) tablet 10 mg  10 mg Oral TID Hessie Knows, MD   10 mg at 03/16/21 0850   bisacodyl (DULCOLAX) EC tablet 5 mg  5 mg Oral Daily PRN Nicole Kindred A, DO   5 mg at 03/15/21 9924   bisacodyl (DULCOLAX) suppository 10 mg  10 mg Rectal Daily PRN Nicole Kindred A, DO       calcitonin (salmon) (MIACALCIN/FORTICAL) nasal spray 1 spray  1 spray Alternating Nares Daily Hessie Knows, MD   1 spray at 03/16/21 0848   carvedilol (COREG) tablet 25 mg  25 mg Oral BID WC Hessie Knows, MD   25 mg at 03/16/21 0850   ceFAZolin (ANCEF) IVPB 2g/100 mL premix  2 g Intravenous 60 min Pre-Op Hessie Knows, MD       dexamethasone (DECADRON) tablet 4 mg  4 mg Oral Q12H Nicole Kindred A, DO   4 mg at 03/16/21 0850   fentaNYL (SUBLIMAZE) injection 12.5 mcg  12.5 mcg Intravenous Q2H PRN Hessie Knows, MD   12.5 mcg at 03/11/21 1802   gabapentin (NEURONTIN) capsule 100 mg  100 mg Oral TID Hessie Knows, MD   100 mg at 03/16/21 0850   hydrALAZINE (APRESOLINE) injection 5 mg  5 mg Intravenous Q2H PRN Hessie Knows, MD   5 mg at 03/12/21 2345   lidocaine (XYLOCAINE) 5 % ointment   Topical TID PRN Nicole Kindred A, DO   Given at 03/16/21 0532   morphine 2 MG/ML injection 2-4 mg  2-4 mg Intravenous Q2H PRN Nicole Kindred A, DO       multivitamin (RENA-VIT) tablet 1 tablet  1 tablet Oral QHS Nicole Kindred A, DO   1 tablet at 03/15/21 2134   ondansetron (ZOFRAN) injection 4 mg  4 mg Intravenous Q8H PRN Hessie Knows, MD   4 mg at 03/16/21 0846   oxyCODONE (Oxy IR/ROXICODONE) immediate release tablet 5-10 mg  5-10 mg Oral Q6H PRN Nicole Kindred A, DO   10 mg at 03/15/21 1304   oxyCODONE (OXYCONTIN) 12 hr tablet 15 mg  15 mg Oral Q12H Nicole Kindred A, DO   15 mg at 03/16/21 0850   polyethylene glycol (MIRALAX / GLYCOLAX) packet 17 g  17 g Oral BID PRN Hessie Knows, MD       polyethylene glycol (MIRALAX / GLYCOLAX) packet 17 g  17  g Oral Daily Hessie Knows, MD   17 g at 03/15/21 2683   promethazine (PHENERGAN) 12.5 mg in sodium chloride 0.9 % 50 mL IVPB  12.5 mg Intravenous Q6H PRN Hessie Knows, MD       senna-docusate (Senokot-S) tablet 1 tablet  1 tablet Oral BID Nicole Kindred A, DO   1 tablet at 03/16/21 0851   sodium phosphate (FLEET) 7-19 GM/118ML enema 1 enema  1 enema Rectal Daily PRN Ezekiel Slocumb, DO         Discharge Medications: Please see discharge summary for a list  of discharge medications.  Relevant Imaging Results:  Relevant Lab Results:   Additional Information SSN Ruby Lin Glazier, RN

## 2021-03-16 NOTE — TOC Progression Note (Signed)
Transition of Care [facility]) - Progression Note    Patient Details  Name: Jack [name] MRN: [id number] Date of Birth: [birth year]  Transition of Care [facility]) CM/SW [place], RN Phone Number: 03/16/2021, 12:46 PM  Clinical Narrative:   Delayed entry from 7/1  Patient and daughter agreed to bed search, stated they prefer [facility].  RNCM explained that we cannot guarantee placement, but that I will review bed offers from facilities with patient and daughter.  Waiting for PT eval to submit bed search.  TOC contact information given, TOC to follow to discharge.         Expected Discharge Plan and Services                                                 Social Determinants of Health (SDOH) Interventions    Readmission Risk Interventions No flowsheet data found.

## 2021-03-16 NOTE — Care Management Important Message (Signed)
Important Message  Patient Details  Name: Jack [name] MRN: [id number] Date of Birth: 1933/01/05   Medicare Important Message Given:  Yes     [name] 03/16/2021, 11:46 AM

## 2021-03-16 NOTE — TOC Progression Note (Signed)
Transition of Care [facility]) - Progression Note    Patient Details  Name: Jack [name] MRN: [id number] Date of Birth: 1932/09/20  [facility] Care [facility]) CM/SW [place], RN Phone Number: 03/16/2021, 12:47 PM  Clinical Narrative:   PT eval in system, bed search started, will follow up with patient and daughter when bed offers are received.           Expected Discharge Plan and Services                                                 Social Determinants of Health (SDOH) Interventions    Readmission Risk Interventions No flowsheet data found.

## 2021-03-16 NOTE — Progress Notes (Signed)
PROGRESS NOTE    Jack [name]   KZL:[id number]  DOB: 08/07/1933  PCP: [name], MD    DOA: 03/11/2021 LOS: 4   Assessment & Plan   Principal Problem:   Compression fracture of L2 and L3 Active Problems:   Kappa light chain myeloma (HCC)   Multiple pathological fractures   Essential hypertension   Abdominal pain   Normocytic anemia   Hyperplasia, prostate   Back pain   Hyperlipidemia   CKD (chronic kidney disease), stage IV (HCC)   Constipation   Atrial fibrillation (HCC)   Back pain due to acute compression fractures of L2 and L3 -likely due to bone involvement of Multiple myeloma -Dr. Cari Caraway was consulted with neurosurgery who recommended kyphoplasty, Dr. Rudene Christians with orthopedic surgery who plan kyphoplasty of L2 and L3 on 03/12/21.  Bone marrow sent for biopsy. --Oncology and Palliative care consulted --Murdo discussions ongoing --Continue pain control per orders, Decadron Excellent control on lowest dose OxyContin 15 mg BID --Monitor closely for side effects --Lidocaine cream for rib cage pain --Continue baclofen --resumed Eliquis (after procedure) --Per oncology: will need PET scan outpatient  Severe constipation, opioid-induced - Resolved with mag citrate.  Continue bowel regimen per orders.    Abdominal pain -reported on admission, was better but now progressive, due to constipation.   CT abdomen and pelvis was negative for anything acute to explain it.  Calcium level normal.   Monitor clinically. Bowel regimen.  PRN antiemetics.  Atrial fibrillation -rate controlled.  Continue Coreg.  Hold Eliquis for procedure as above.  CKD stage IV -renal function at baseline.  Monitor BMP.  Normocytic anemia possibly secondary to multiple myeloma -Dr. Grayland Ormond with oncology is consulted.  Follow-up bone biopsy, to be obtained during high [place]  Essential hypertension -continue Coreg.  IV hydralazine as needed.  BPH -continue Flomax  Hyperlipidemia  -continue Lipitor  Constipation -bowel regimen per orders.   Patient BMI: Body mass index is 26.58 kg/m.   DVT prophylaxis: apixaban (ELIQUIS) tablet 2.5 mg Start: 03/13/21 2200 Place and maintain sequential compression device Start: 03/11/21 1513 apixaban (ELIQUIS) tablet 2.5 mg   Diet:  Diet Orders (From admission, onward)     Start     Ordered   03/12/21 2127  Diet regular Room service appropriate? Yes; Fluid consistency: Thin  Diet effective now       Question Answer Comment  Room service appropriate? Yes   Fluid consistency: Thin      03/12/21 2126              Code Status: Full Code   Brief Narrative / Hospital Course to Date:   87 y.o. male with medical history significant of HTN, HLD, CKD-IV, GERD, BPH, anemia, a fib on eliquis, ureteral stone, compression fracture of L1 and T12-spine (s/p recent kyphoplasty), who presents with intractable back pain and abdominal pain.  He is currently undergoing evaluation for multiple myeloma with Dr. Grayland Ormond.    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    Subjective 03/16/21    Patient feels better today after having 5 BM's finally after 2nd mag citrate bottle yesterday.  He still has aching ribs but back pain better.  Likely will go to SNF, wants wife's input about currently level of assistance he is needing.  No f/c, n/v/d, cp, sob or other issues.    Disposition  Plan & Communication   Status is: Inpatient  Remains inpatient appropriate because: requires SNF placement for rehab pending  Dispo: The patient is from: Home              Anticipated d/c is to: Home              Patient currently is not medically stable to d/c.   Difficult to place patient No   Consults, Procedures, Significant Events   Consultants:  Oncology Palliative  care Neurosurgery Orthopedic surgery  Procedures:  Kyphoplasty 03/12/2021  Antimicrobials:  Anti-infectives (From admission, onward)    Start     Dose/Rate Route Frequency Ordered Stop   03/12/21 1900  ceFAZolin (ANCEF) IVPB 2g/100 mL premix        2 g 200 mL/hr over 30 Minutes Intravenous 60 min pre-op 03/11/21 1757     03/12/21 1533  ceFAZolin (ANCEF) 2-4 GM/100ML-% IVPB       Note to Pharmacy: Norton Blizzard  : cabinet override      03/12/21 1533 03/13/21 0344         Micro    Objective   Vitals:   03/15/21 1932 03/16/21 0521 03/16/21 0724 03/16/21 1122  BP: 125/85 (!) 152/87 (!) 147/83 111/74  Pulse: 70 (!) 57 (!) 52 66  Resp: '17 17 17 18  ' Temp: 97.7 F (36.5 C) 97.6 F (36.4 C) 98.1 F (36.7 C) 98.4 F (36.9 C)  TempSrc: Oral Oral Oral Oral  SpO2: 100% 95% 96% 95%  Weight:      Height:        Intake/Output Summary (Last 24 hours) at 03/16/2021 1337 Last data filed at 03/16/2021 1004 Gross per 24 hour  Intake 120 ml  Output 300 ml  Net -180 ml   Filed Weights   03/11/21 0834  Weight: 81.6 kg    Physical Exam:  General exam: awake, resting in bed, no acute distress Respiratory system: normal respiratory effort, on room air Cardiovascular system: RRR, no pedal edema.   Central nervous system: A&O x3. Normal speech, grossly non-focal exam GI: abdomen softer today, non-tender Extremities: no peripheral edema, normal tone, moves all extremities Psychiatry: normal mood, congruent affect, judgement and insight appear normal  Labs   Data Reviewed: I have personally reviewed following labs and imaging studies  CBC: Recent Labs  Lab 03/11/21 0829 03/12/21 0730  WBC 4.2 3.4*  NEUTROABS 2.6  --   HGB 11.9* 11.5*  HCT 35.4* 34.3*  MCV 92.4 91.7  PLT 244 588   Basic Metabolic Panel: Recent Labs  Lab 03/11/21 0829 03/12/21 0730  NA 136 140  K 3.7 3.7  CL 101 104  CO2 20* 26  GLUCOSE 131* 114*  BUN 16 15  CREATININE 1.48* 1.21  CALCIUM  10.0 9.8   GFR: Estimated Creatinine Clearance: 44.6 mL/min (by C-G formula based on SCr of 1.21 mg/dL). Liver Function Tests: Recent Labs  Lab 03/11/21 0829  AST 23  ALT 8  ALKPHOS 149*  BILITOT 1.0  PROT 6.7  ALBUMIN 4.1   Recent Labs  Lab 03/11/21 0829  LIPASE 30   No results for input(s): AMMONIA in the last 168 hours. Coagulation Profile: Recent Labs  Lab 03/11/21 1957  INR 1.2   Cardiac Enzymes: No results for input(s): CKTOTAL, CKMB, CKMBINDEX, TROPONINI in the last 168 hours. BNP (last 3 results) No results for input(s): PROBNP in the last 8760 hours. HbA1C: No results for input(s): HGBA1C in the last 72 hours. CBG: No  results for input(s): GLUCAP in the last 168 hours. Lipid Profile: No results for input(s): CHOL, HDL, LDLCALC, TRIG, CHOLHDL, LDLDIRECT in the last 72 hours. Thyroid Function Tests: No results for input(s): TSH, T4TOTAL, FREET4, T3FREE, THYROIDAB in the last 72 hours. Anemia Panel: No results for input(s): VITAMINB12, FOLATE, FERRITIN, TIBC, IRON, RETICCTPCT in the last 72 hours. Sepsis Labs: No results for input(s): PROCALCITON, LATICACIDVEN in the last 168 hours.  Recent Results (from the past 240 hour(s))  Resp Panel by RT-PCR (Flu A&B, Covid) Nasopharyngeal Swab     Status: None   Collection Time: 03/11/21 11:36 AM   Specimen: Nasopharyngeal Swab; Nasopharyngeal(NP) swabs in vial transport medium  Result Value Ref Range Status   SARS Coronavirus 2 by RT PCR NEGATIVE NEGATIVE Final    Comment: (NOTE) SARS-CoV-2 target nucleic acids are NOT DETECTED.  The SARS-CoV-2 RNA is generally detectable in upper respiratory specimens during the acute phase of infection. The lowest concentration of SARS-CoV-2 viral copies this assay can detect is 138 copies/mL. A negative result does not preclude SARS-Cov-2 infection and should not be used as the sole basis for treatment or other patient management decisions. A negative result may occur with   improper specimen collection/handling, submission of specimen other than nasopharyngeal swab, presence of viral mutation(s) within the areas targeted by this assay, and inadequate number of viral copies(<138 copies/mL). A negative result must be combined with clinical observations, patient history, and epidemiological information. The expected result is Negative.  Fact Sheet for Patients:  EntrepreneurPulse.com.au  Fact Sheet for Healthcare Providers:  IncredibleEmployment.be  This test is no t yet approved or cleared by the [place] FDA and  has been authorized for detection and/or diagnosis of SARS-CoV-2 by FDA under an Emergency Use Authorization (EUA). This EUA will remain  in effect (meaning this test can be used) for the duration of the COVID-19 declaration under Section 564(b)(1) of the Act, 21 U.S.C.section 360bbb-3(b)(1), unless the authorization is terminated  or revoked sooner.       Influenza A by PCR NEGATIVE NEGATIVE Final   Influenza B by PCR NEGATIVE NEGATIVE Final    Comment: (NOTE) The Xpert Xpress SARS-CoV-2/FLU/RSV plus assay is intended as an aid in the diagnosis of influenza from Nasopharyngeal swab specimens and should not be used as a sole basis for treatment. Nasal washings and aspirates are unacceptable for Xpert Xpress SARS-CoV-2/FLU/RSV testing.  Fact Sheet for Patients: EntrepreneurPulse.com.au  Fact Sheet for Healthcare Providers: IncredibleEmployment.be  This test is not yet approved or cleared by the [place] FDA and has been authorized for detection and/or diagnosis of SARS-CoV-2 by FDA under an Emergency Use Authorization (EUA). This EUA will remain in effect (meaning this test can be used) for the duration of the COVID-19 declaration under Section 564(b)(1) of the Act, 21 U.S.C. section 360bbb-3(b)(1), unless the authorization is terminated  or revoked.  Performed at [facility], [address] [place], [place] [address]       Imaging Studies   No results found.   Medications   Scheduled Meds:  acetaminophen  1,000 mg Oral Q8H   apixaban  2.5 mg Oral BID   atorvastatin  40 mg Oral q1800   baclofen  10 mg Oral TID   calcitonin (salmon)  1 spray Alternating Nares Daily   carvedilol  25 mg Oral BID WC   dexamethasone  4 mg Oral Q12H   gabapentin  100 mg Oral TID   multivitamin  1 tablet Oral QHS  oxyCODONE  15 mg Oral Q12H   polyethylene glycol  17 g Oral Daily   senna-docusate  1 tablet Oral BID   Continuous Infusions:   ceFAZolin (ANCEF) IV     promethazine (PHENERGAN) injection (IM or IVPB)         LOS: 4 days    Time spent: 25 minutes with > 50% spent at bedside and in coordination of care.    Ezekiel Slocumb, DO [facility]  03/16/2021, 1:37 PM      If 7PM-7AM, please contact night-coverage. How to contact the [facility] Attending or Consulting provider [place] or covering provider during after hours [place], for this patient?    Check the care team in [facility] and look for a) attending/consulting [facility] provider listed and b) the [facility] team listed Log into [email or web address] and use [place]'s universal password to access. If you do not have the password, please contact the hospital operator. Locate the [facility] provider you are looking for under [facility] and page to a number that you can be directly reached. If you still have difficulty reaching the provider, please page the [facility] (Director on Call) for the Hospitalists listed on amion for assistance.

## 2021-03-17 ENCOUNTER — Ambulatory Visit
Admission: RE | Admit: 2021-03-17 | Discharge: 2021-03-17 | Disposition: A | Discharge status: Home / Self Care | Payer: PPO | Source: Ambulatory Visit | Attending: Oncology | Admitting: Oncology

## 2021-03-17 DIAGNOSIS — M8440XS Pathological fracture, unspecified site, sequela: Secondary | ICD-10-CM | POA: Diagnosis not present

## 2021-03-17 DIAGNOSIS — S32020D Wedge compression fracture of second lumbar vertebra, subsequent encounter for fracture with routine healing: Secondary | ICD-10-CM | POA: Diagnosis not present

## 2021-03-17 DIAGNOSIS — C9000 Multiple myeloma not having achieved remission: Secondary | ICD-10-CM | POA: Diagnosis not present

## 2021-03-17 LAB — BASIC METABOLIC PANEL
Anion gap: 7 (ref 5–15)
BUN: 46 mg/dL — ABNORMAL HIGH (ref 8–23)
CO2: 29 mmol/L (ref 22–32)
Calcium: 9.6 mg/dL (ref 8.9–10.3)
Chloride: 101 mmol/L (ref 98–111)
Creatinine, Ser: 1.32 mg/dL — ABNORMAL HIGH (ref 0.61–1.24)
GFR, Estimated: 53 mL/min — ABNORMAL LOW (ref >60)
Glucose, Bld: 128 mg/dL — ABNORMAL HIGH (ref 70–99)
Potassium: 4.5 mmol/L (ref 3.5–5.1)
Sodium: 137 mmol/L (ref 135–145)

## 2021-03-17 MED ORDER — LIDOCAINE 5 % EX OINT: TOPICAL_OINTMENT | Freq: Three times a day (TID) | CUTANEOUS | 0 refills | Status: DC | PRN

## 2021-03-17 MED ORDER — DEXAMETHASONE 4 MG PO TABS: 4.0000 mg | ORAL_TABLET | Freq: Two times a day (BID) | ORAL | 0 refills | Status: DC

## 2021-03-17 MED ORDER — BISACODYL 5 MG PO TBEC: 5.0000 mg | DELAYED_RELEASE_TABLET | Freq: Every day | ORAL | 0 refills | Status: DC | PRN

## 2021-03-17 MED ORDER — SENNOSIDES-DOCUSATE SODIUM 8.6-50 MG PO TABS: 1.0000 | ORAL_TABLET | Freq: Two times a day (BID) | ORAL | 0 refills | Status: AC

## 2021-03-17 MED ORDER — OXYCODONE HCL 5 MG PO TABS: 5.0000 mg | ORAL_TABLET | Freq: Four times a day (QID) | ORAL | 0 refills | Status: DC | PRN

## 2021-03-17 MED ORDER — CARVEDILOL 25 MG PO TABS: 25.0000 mg | ORAL_TABLET | Freq: Two times a day (BID) | ORAL | 1 refills | Status: DC

## 2021-03-17 MED ORDER — ACETAMINOPHEN 500 MG PO TABS: 1000.0000 mg | ORAL_TABLET | Freq: Three times a day (TID) | ORAL | 0 refills | Status: DC

## 2021-03-17 MED ORDER — OXYCODONE HCL ER 15 MG PO T12A: 15.0000 mg | EXTENDED_RELEASE_TABLET | Freq: Two times a day (BID) | ORAL | 0 refills | Status: DC

## 2021-03-17 MED FILL — CARVEDILOL 25 MG TABLET: 30 days supply | Qty: 60 | Fill #0

## 2021-03-17 MED FILL — CVS C-LAX LAXATIVE EC 5 MG TAB: 30 days supply | Qty: 30 | Fill #0

## 2021-03-17 MED FILL — CVS SENNA PLUS TABLET: 15 days supply | Qty: 30 | Fill #0

## 2021-03-17 MED FILL — DEXAMETHASONE 4 MG TABLET: 15 days supply | Qty: 30 | Fill #0

## 2021-03-17 NOTE — Progress Notes (Deleted)
Physician Discharge Summary  Jack [name] RXY:[id number] DOB: 1933/01/01 DOA: 03/11/2021  PCP: [name], MD  Admit date: 03/11/2021 Discharge date: 03/17/2021  Admitted From: home Disposition:  home  Recommendations for Outpatient Follow-up:  Follow up with PCP in 1-2 weeks Please obtain BMP/CBC in one week Please follow up with Dr. Grayland Ormond as scheduled   Home Health: PT, OT  Equipment/Devices: Pt already has  Discharge Condition: stable  CODE STATUS: full  Diet recommendation: Regular     Discharge Diagnoses: Principal Problem:   Compression fracture of L2 and L3 Active Problems:   Kappa light chain myeloma ([place])   Multiple pathological fractures   Essential hypertension   Abdominal pain   Normocytic anemia   Hyperplasia, prostate   Back pain   Hyperlipidemia   CKD (chronic kidney disease), stage IV (HCC)   Constipation   Atrial fibrillation ([place])    Summary of HPI and Hospital Course:  87 y.o. male with medical history significant of HTN, HLD, CKD-IV, GERD, BPH, anemia, a fib on eliquis, ureteral stone, compression fracture of L1 and T12-spine (s/p recent kyphoplasty), who presents with intractable back pain and abdominal pain.  He is currently undergoing evaluation for multiple myeloma with Dr. Grayland Ormond.    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     Back pain due to acute compression fractures of L2 and L3 -likely due to bone involvement of Multiple myeloma -Dr. Cari Caraway was consulted with neurosurgery who recommended kyphoplasty, Dr. Rudene Christians with orthopedic surgery who plan kyphoplasty of L2 and L3 on 03/12/21.  Bone marrow sent for biopsy. Paint well-controlled during admission with scheduled Tylenol and oxyContin and minimal use of PRN oxy IR. --Oncology and Palliative care  consulted --Continue scheduled OxyContin, PRN oxycodone IR, Decadron --Monitor closely for side effects --Lidocaine cream for rib cage pain --Continue baclofen --Per oncology: will need PET scan outpatient Follow up with Dr. Grayland Ormond in clinic as scheduled.  Severe constipation, opioid-induced - Resolved with mag citrate. Continue bowel regimen per orders.   Patient reports good results with mag citrate at home.   Abdominal pain due to constipation - reported on admission, was better but now progressive, due to constipation.  CT abdomen and pelvis was negative for anything acute to explain it.  Calcium level normal.  Monitor clinically. Bowel regimen.  PRN antiemetics.   Atrial fibrillation -rate controlled.  Continue Coreg.  Hold Eliquis for procedure as above.   CKD stage IV -renal function at baseline.  Monitor BMP.   Normocytic anemia - possible related to myeloma. Follow CBC. Follows with heme/oncology  Essential hypertension -continue Coreg, dose increased to 25 mg BID (from 12.5)  BPH -pt reported no longer takes Flomax  Hyperlipidemia -continue Lipitor    Patient BMI: Body mass index is 26.58 kg/m.      Discharge Instructions   Discharge Instructions     Call MD for:  extreme fatigue   Complete by: As directed    Call MD for:  persistant dizziness or light-headedness   Complete by: As directed    Call MD for:  persistant nausea and vomiting   Complete by: As directed    Call MD for:  severe uncontrolled pain   Complete by: As directed    Call MD for:  temperature >100.4   Complete by: As directed    Diet - low sodium  heart healthy   Complete by: As directed    Increase activity slowly   Complete by: As directed    No wound care   Complete by: As directed       Allergies as of 03/17/2021       Reactions   Quinolones    Aortic dilation contraindicates FQ   Amlodipine Other (See Comments)   LE edema   Azithromycin Nausea And Vomiting   Lisinopril  Cough   Morphine And Related Nausea And Vomiting        Medication List     STOP taking these medications    oxyCODONE-acetaminophen 5-325 MG tablet Commonly known as: PERCOCET/ROXICET   tamsulosin 0.4 MG Caps capsule Commonly known as: FLOMAX       TAKE these medications    acetaminophen 500 MG tablet Commonly known as: TYLENOL Take 2 tablets (1,000 mg total) by mouth every 8 (eight) hours. What changed:  medication strength how much to take when to take this reasons to take this   apixaban 2.5 MG Tabs tablet Commonly known as: ELIQUIS Take 1 tablet (2.5 mg total) by mouth 2 (two) times daily.   atorvastatin 40 MG tablet Commonly known as: Lipitor Take 1 tablet (40 mg total) by mouth daily at 6 PM.   baclofen 10 MG tablet Commonly known as: LIORESAL Take 10 mg by mouth 3 (three) times daily.   bisacodyl 5 MG EC tablet Commonly known as: DULCOLAX Take 1 tablet (5 mg total) by mouth daily as needed for moderate constipation.   calcitonin (salmon) 200 UNIT/ACT nasal spray Commonly known as: MIACALCIN/FORTICAL Place 1 spray into alternate nostrils daily.   carvedilol 25 MG tablet Commonly known as: COREG Take 1 tablet (25 mg total) by mouth 2 (two) times daily with a meal.   dexamethasone 4 MG tablet Commonly known as: DECADRON Take 1 tablet (4 mg total) by mouth every 12 (twelve) hours.   docusate sodium 100 MG capsule Commonly known as: COLACE Take 1 capsule (100 mg total) by mouth 2 (two) times daily.   gabapentin 100 MG capsule Commonly known as: NEURONTIN Take 100 mg by mouth 3 (three) times daily.   lidocaine 5 % ointment Commonly known as: XYLOCAINE Apply topically 3 (three) times daily as needed for mild pain or moderate pain.   ondansetron 8 MG tablet Commonly known as: ZOFRAN Take 1 tablet (8 mg total) by mouth every 8 (eight) hours as needed for nausea or vomiting.   oxyCODONE 15 mg 12 hr tablet Commonly known as: OXYCONTIN Take 1  tablet (15 mg total) by mouth every 12 (twelve) hours.   oxyCODONE 5 MG immediate release tablet Commonly known as: Oxy IR/ROXICODONE Take 1-2 tablets (5-10 mg total) by mouth every 6 (six) hours as needed for breakthrough pain.   senna 8.6 MG Tabs tablet Commonly known as: SENOKOT Take 2 tablets by mouth at bedtime.   senna-docusate 8.6-50 MG tablet Commonly known as: Senokot-S Take 1 tablet by mouth 2 (two) times daily.        Follow-up Information     Hessie Knows, MD Follow up in 2 week(s).   Specialty: Orthopedic Surgery Why: For wound care Contact information: [address] [facility] [place] [address] [phone]                Allergies  Allergen Reactions   Quinolones     Aortic dilation contraindicates FQ   Amlodipine Other (See Comments)  LE edema   Azithromycin Nausea And Vomiting   Lisinopril Cough   Morphine And Related Nausea And Vomiting     If you experience worsening of your admission symptoms, develop shortness of breath, life threatening emergency, suicidal or homicidal thoughts you must seek medical attention immediately by calling 911 or calling your MD immediately  if symptoms less severe.    Please note   You were cared for by a hospitalist during your hospital stay. If you have any questions about your discharge medications or the care you received while you were in the hospital after you are discharged, you can call the unit and asked to speak with the hospitalist on call if the hospitalist that took care of you is not available. Once you are discharged, your primary care physician will handle any further medical issues. Please note that NO REFILLS for any discharge medications will be authorized once you are discharged, as it is imperative that you return to your primary care physician (or establish a relationship with a primary care physician if you do not have one) for your aftercare needs so that they  can reassess your need for medications and monitor your lab values.   Consultations: Oncology Palliative Care Orthopedic surgery    Procedures/Studies: DG Lumbar Spine 2-3 Views  Result Date: 03/12/2021 CLINICAL DATA:  Provided history: Fracture. Additional history provided: L2-L3 kyphoplasty. Provided fluoroscopy time 2 minutes, 27 seconds. EXAM: LUMBAR SPINE - 2-3 VIEW; DG C-ARM 1-60 MIN COMPARISON:  CT abdomen/pelvis 03/11/2021. FINDINGS: AP and lateral view intraprocedural fluoroscopic images of the lumbar spine are submitted, 2 images total. On the provided images, kyphoplasty material is present within three contiguous lumbar vertebral bodies (these are presumably the L1, L2 and L3 vertebral bodies given the provided history and findings on prior imaging. However, neither the thoracolumbar nor lumbosacral junction are included in the field of view to allow exact level determination). IMPRESSION: Two intraprocedural fluoroscopic images of the lumbar spine from reported L2 and L3 kyphoplasty, as described. Electronically Signed   By: Kellie Simmering DO   On: 03/12/2021 18:03   CT ABDOMEN PELVIS W CONTRAST  Result Date: 03/11/2021 CLINICAL DATA:  87 year old male with abdominal pain. Recent diagnosis of MGUS, concern for multiple myeloma. EXAM: CT ABDOMEN AND PELVIS WITH CONTRAST TECHNIQUE: Multidetector CT imaging of the abdomen and pelvis was performed using the standard protocol following bolus administration of intravenous contrast. CONTRAST:  1107m OMNIPAQUE IOHEXOL 300 MG/ML  SOLN COMPARISON:  Noncontrast CT Abdomen and Pelvis 01/29/2021 and earlier. Thoracic and lumbar MRI 01/29/2021. FINDINGS: Lower chest: Stable small to moderate hiatal hernia. Tortuous thoracic aorta. Calcified coronary artery atherosclerosis. No cardiomegaly or pericardial effusion. No pleural effusion. Stable mild lung base atelectasis or scarring. Hepatobiliary: Gallbladder is at the upper limits of normal but does not  appear inflamed. Negative liver. No bile duct enlargement. Pancreas: Negative. Spleen: Negative. Adrenals/Urinary Tract: Normal adrenal glands. Nonobstructed kidneys with symmetric renal enhancement and contrast excretion. There is probable small benign left renal parapelvic cyst accounting for the increased left renal hilum soft tissue on series 7, image 26, stable since 2018. Decompressed ureters. Decompressed and negative bladder. Stomach/Bowel: Negative large bowel aside from retained stool in the right colon. Diminutive, negative appendix on coronal image 48. Flocculated material in the terminal ileum but no dilated small bowel. Aside from hiatal hernia the stomach is negative. Negative duodenum. No free air or free fluid. Vascular/Lymphatic: Aortoiliac calcified atherosclerosis. Major arterial structures remain patent in the abdomen and pelvis.  Tortuous iliac arteries. No lymphadenopathy. Reproductive: Prostatomegaly. Previous left inguinal hernia repair with mesh appears stable. Otherwise negative. Other: No pelvic free fluid. Musculoskeletal: Diffuse osteopenia and heterogeneous bone mineralization similar to the CT last month, distinctly different from a 2018 CT. Interval augmentation of the T12 and L1 compression fractures. New/progressed compression fractures of L2 and L3 since last month. No significant retropulsion of bone or other complicating features. Bone mineralization is especially heterogeneous in the lumbar spine and pelvis, and these are likely pathologic fractures in the setting of MGUS/myeloma. No other acute osseous abnormality identified. IMPRESSION: 1. Diffusely abnormal bone mineralization in keeping with MGUS/Multiple Myeloma with new or progressed pathologic compression fractures of L2 and L3 since last month. Interval augmentation of T12 and L1. 2. No other acute finding in the abdomen or pelvis. Small to moderate hiatal hernia. Prostatomegaly. Aortic Atherosclerosis (ICD10-I70.0).  Electronically Signed   By: Genevie Ann M.D.   On: 03/11/2021 09:53   DG Bone Survey Met  Result Date: 02/22/2021 CLINICAL DATA:  87 year old male with T12 compression fracture. Evaluate for metastatic bone lesion. EXAM: METASTATIC BONE SURVEY COMPARISON:  None. FINDINGS: No acute osseous pathology. No definite metastatic osseous lesion identified. There is a small lucent lesion in the proximal third of the left femoral diaphysis, indeterminate. The bones are osteopenic. T12 and L1 compression fractures and vertebroplasty changes. Atherosclerotic calcification of the aorta. Pelvic hernia repair mesh. IMPRESSION: 1. No acute osseous pathology. No definite metastatic osseous lesion. 2. Small lucent lesion in the proximal third of the left femoral diaphysis, indeterminate. Electronically Signed   By: Anner Crete M.D.   On: 02/22/2021 14:16   DG C-Arm 1-60 Min  Result Date: 03/12/2021 CLINICAL DATA:  Provided history: Fracture. Additional history provided: L2-L3 kyphoplasty. Provided fluoroscopy time 2 minutes, 27 seconds. EXAM: LUMBAR SPINE - 2-3 VIEW; DG C-ARM 1-60 MIN COMPARISON:  CT abdomen/pelvis 03/11/2021. FINDINGS: AP and lateral view intraprocedural fluoroscopic images of the lumbar spine are submitted, 2 images total. On the provided images, kyphoplasty material is present within three contiguous lumbar vertebral bodies (these are presumably the L1, L2 and L3 vertebral bodies given the provided history and findings on prior imaging. However, neither the thoracolumbar nor lumbosacral junction are included in the field of view to allow exact level determination). IMPRESSION: Two intraprocedural fluoroscopic images of the lumbar spine from reported L2 and L3 kyphoplasty, as described. Electronically Signed   By: Kellie Simmering DO   On: 03/12/2021 18:03    Kyphoplasty   Subjective: Pt reports lab tech stuck him for blood draw while he was still deep asleep this AM.  He was very distraught about it.   Then could not get help to use bathroom and states he peed in the sink.  Reports good pain control.  He no longer wants to go to SNF, concerned care there will be worse.  Wife to bedside later this AM and agrees with patient going home with [facility] PT instead of to SNF.     Discharge Exam: Vitals:   03/17/21 0736 03/17/21 1152  BP: (!) 146/68 (!) 145/91  Pulse: (!) 50 79  Resp: 18 18  Temp: (!) 97.4 F (36.3 C) 97.6 F (36.4 C)  SpO2: 96% 98%   Vitals:   03/16/21 2331 03/17/21 0546 03/17/21 0736 03/17/21 1152  BP: 104/64 (!) 166/96 (!) 146/68 (!) 145/91  Pulse: (!) 52 (!) 54 (!) 50 79  Resp: '14 20 18 18  ' Temp: 98.2 F (36.8 C) 98.7 F (  37.1 C) (!) 97.4 F (36.3 C) 97.6 F (36.4 C)  TempSrc:   Oral Oral  SpO2: 96% 97% 96% 98%  Weight:      Height:        General: Pt is alert, awake, not in acute distress Cardiovascular: RRR, S1/S2 +, no rubs, no gallops Respiratory: CTA bilaterally, no wheezing, no rhonchi Abdominal: Soft, non-tender, mild distension, bowel sounds + Extremities: no edema, no cyanosis    The results of significant diagnostics from this hospitalization (including imaging, microbiology, ancillary and laboratory) are listed below for reference.     Microbiology: Recent Results (from the past 240 hour(s))  Resp Panel by RT-PCR (Flu A&B, Covid) Nasopharyngeal Swab     Status: None   Collection Time: 03/11/21 11:36 AM   Specimen: Nasopharyngeal Swab; Nasopharyngeal(NP) swabs in vial transport medium  Result Value Ref Range Status   SARS Coronavirus 2 by RT PCR NEGATIVE NEGATIVE Final    Comment: (NOTE) SARS-CoV-2 target nucleic acids are NOT DETECTED.  The SARS-CoV-2 RNA is generally detectable in upper respiratory specimens during the acute phase of infection. The lowest concentration of SARS-CoV-2 viral copies this assay can detect is 138 copies/mL. A negative result does not preclude SARS-Cov-2 infection and should not be used as the sole basis for  treatment or other patient management decisions. A negative result may occur with  improper specimen collection/handling, submission of specimen other than nasopharyngeal swab, presence of viral mutation(s) within the areas targeted by this assay, and inadequate number of viral copies(<138 copies/mL). A negative result must be combined with clinical observations, patient history, and epidemiological information. The expected result is Negative.  Fact Sheet for Patients:  EntrepreneurPulse.com.au  Fact Sheet for Healthcare Providers:  IncredibleEmployment.be  This test is no t yet approved or cleared by the [place] FDA and  has been authorized for detection and/or diagnosis of SARS-CoV-2 by FDA under an Emergency Use Authorization (EUA). This EUA will remain  in effect (meaning this test can be used) for the duration of the COVID-19 declaration under Section 564(b)(1) of the Act, 21 U.S.C.section 360bbb-3(b)(1), unless the authorization is terminated  or revoked sooner.       Influenza A by PCR NEGATIVE NEGATIVE Final   Influenza B by PCR NEGATIVE NEGATIVE Final    Comment: (NOTE) The Xpert Xpress SARS-CoV-2/FLU/RSV plus assay is intended as an aid in the diagnosis of influenza from Nasopharyngeal swab specimens and should not be used as a sole basis for treatment. Nasal washings and aspirates are unacceptable for Xpert Xpress SARS-CoV-2/FLU/RSV testing.  Fact Sheet for Patients: EntrepreneurPulse.com.au  Fact Sheet for Healthcare Providers: IncredibleEmployment.be  This test is not yet approved or cleared by the [place] FDA and has been authorized for detection and/or diagnosis of SARS-CoV-2 by FDA under an Emergency Use Authorization (EUA). This EUA will remain in effect (meaning this test can be used) for the duration of the COVID-19 declaration under Section 564(b)(1) of the Act, 21  U.S.C. section 360bbb-3(b)(1), unless the authorization is terminated or revoked.  Performed at [facility], [address] [place], [place] [address]      Labs: BNP (last 3 results) Recent Labs    03/11/21 0829  BNP 23.5   Basic Metabolic Panel: Recent Labs  Lab 03/11/21 0829 03/12/21 0730 03/17/21 0506  NA 136 140 137  K 3.7 3.7 4.5  CL 101 104 101  CO2 20* 26 29  GLUCOSE 131* 114* 128*  BUN 16 15 46*  CREATININE  1.48* 1.21 1.32*  CALCIUM 10.0 9.8 9.6   Liver Function Tests: Recent Labs  Lab 03/11/21 0829  AST 23  ALT 8  ALKPHOS 149*  BILITOT 1.0  PROT 6.7  ALBUMIN 4.1   Recent Labs  Lab 03/11/21 0829  LIPASE 30   No results for input(s): AMMONIA in the last 168 hours. CBC: Recent Labs  Lab 03/11/21 0829 03/12/21 0730  WBC 4.2 3.4*  NEUTROABS 2.6  --   HGB 11.9* 11.5*  HCT 35.4* 34.3*  MCV 92.4 91.7  PLT 244 239   Cardiac Enzymes: No results for input(s): CKTOTAL, CKMB, CKMBINDEX, TROPONINI in the last 168 hours. BNP: Invalid input(s): POCBNP CBG: No results for input(s): GLUCAP in the last 168 hours. D-Dimer No results for input(s): DDIMER in the last 72 hours. Hgb A1c No results for input(s): HGBA1C in the last 72 hours. Lipid Profile No results for input(s): CHOL, HDL, LDLCALC, TRIG, CHOLHDL, LDLDIRECT in the last 72 hours. Thyroid function studies No results for input(s): TSH, T4TOTAL, T3FREE, THYROIDAB in the last 72 hours.  Invalid input(s): FREET3 Anemia work up No results for input(s): VITAMINB12, FOLATE, FERRITIN, TIBC, IRON, RETICCTPCT in the last 72 hours. Urinalysis    Component Value Date/Time   COLORURINE STRAW (A) 03/11/2021 0838   APPEARANCEUR CLEAR (A) 03/11/2021 0838   APPEARANCEUR Clear 12/25/2020 1037   LABSPEC 1.009 03/11/2021 0838   PHURINE 9.0 (H) 03/11/2021 0838   GLUCOSEU NEGATIVE 03/11/2021 0838   HGBUR NEGATIVE 03/11/2021 0838   BILIRUBINUR NEGATIVE 03/11/2021 0838   BILIRUBINUR Negative  12/25/2020 1037   KETONESUR 5 (A) 03/11/2021 0838   PROTEINUR 30 (A) 03/11/2021 0838   NITRITE NEGATIVE 03/11/2021 0838   LEUKOCYTESUR TRACE (A) 03/11/2021 0838   Sepsis Labs Invalid input(s): PROCALCITONIN,  WBC,  LACTICIDVEN Microbiology Recent Results (from the past 240 hour(s))  Resp Panel by RT-PCR (Flu A&B, Covid) Nasopharyngeal Swab     Status: None   Collection Time: 03/11/21 11:36 AM   Specimen: Nasopharyngeal Swab; Nasopharyngeal(NP) swabs in vial transport medium  Result Value Ref Range Status   SARS Coronavirus 2 by RT PCR NEGATIVE NEGATIVE Final    Comment: (NOTE) SARS-CoV-2 target nucleic acids are NOT DETECTED.  The SARS-CoV-2 RNA is generally detectable in upper respiratory specimens during the acute phase of infection. The lowest concentration of SARS-CoV-2 viral copies this assay can detect is 138 copies/mL. A negative result does not preclude SARS-Cov-2 infection and should not be used as the sole basis for treatment or other patient management decisions. A negative result may occur with  improper specimen collection/handling, submission of specimen other than nasopharyngeal swab, presence of viral mutation(s) within the areas targeted by this assay, and inadequate number of viral copies(<138 copies/mL). A negative result must be combined with clinical observations, patient history, and epidemiological information. The expected result is Negative.  Fact Sheet for Patients:  EntrepreneurPulse.com.au  Fact Sheet for Healthcare Providers:  IncredibleEmployment.be  This test is no t yet approved or cleared by the [place] FDA and  has been authorized for detection and/or diagnosis of SARS-CoV-2 by FDA under an Emergency Use Authorization (EUA). This EUA will remain  in effect (meaning this test can be used) for the duration of the COVID-19 declaration under Section 564(b)(1) of the Act, 21 U.S.C.section 360bbb-3(b)(1),  unless the authorization is terminated  or revoked sooner.       Influenza A by PCR NEGATIVE NEGATIVE Final   Influenza B by PCR NEGATIVE NEGATIVE Final  Comment: (NOTE) The Xpert Xpress SARS-CoV-2/FLU/RSV plus assay is intended as an aid in the diagnosis of influenza from Nasopharyngeal swab specimens and should not be used as a sole basis for treatment. Nasal washings and aspirates are unacceptable for Xpert Xpress SARS-CoV-2/FLU/RSV testing.  Fact Sheet for Patients: EntrepreneurPulse.com.au  Fact Sheet for Healthcare Providers: IncredibleEmployment.be  This test is not yet approved or cleared by the [place] FDA and has been authorized for detection and/or diagnosis of SARS-CoV-2 by FDA under an Emergency Use Authorization (EUA). This EUA will remain in effect (meaning this test can be used) for the duration of the COVID-19 declaration under Section 564(b)(1) of the Act, 21 U.S.C. section 360bbb-3(b)(1), unless the authorization is terminated or revoked.  Performed at [facility], [address] [place], [place] [address]      Time coordinating discharge: Over 30 minutes  SIGNED:   Ezekiel Slocumb, DO [facility] 03/17/2021, 1:21 PM   If 7PM-7AM, please contact night-coverage [email or web address]

## 2021-03-17 NOTE — Progress Notes (Signed)
Physical Therapy Treatment Patient Details Name: Jack [name] MRN: [id number] DOB: June 08, 1933 Today's Date: 03/17/2021  PT Comments    Pt stopped author when walking by room requesting to get up and ambulate. He easily was able to stand and ambulate 2 laps in hallway without assistance. Pt's spouse and pt both feel confident with DC home this afternoon.   Julaine Fusi PTA 03/17/21, 2:13 PM

## 2021-03-17 NOTE — TOC Progression Note (Signed)
Transition of Care [facility]) - Progression Note    Patient Details  Name: BART ASHFORD MRN: [id number] Date of Birth: April 23, 1933  Transition of Care [facility]) CM/SW [place], RN Phone Number: 03/17/2021, 4:22 PM  Clinical Narrative:   Patient and family decided to discharge home with Home Health.  [facility] confirmed by [facility].  No equipment recommended.         Expected Discharge Plan and Services           Expected Discharge Date: 03/17/21                                     Social Determinants of Health (SDOH) Interventions    Readmission Risk Interventions No flowsheet data found.

## 2021-03-17 NOTE — Progress Notes (Signed)
Physical Therapy Treatment Patient Details Name: Jack [name] MRN: [id number] DOB: [birth year] Today's Date: 03/17/2021    History of Present Illness Jack [name] is an 87 y.o. male who is s/p L2-L3 kyphoplasty. PMH includes:  gastric reflux, AFIB, hypertension, hyperlipidemia, recent T12/L1 kyphoplasty, and possible CA (myeloma).    PT Comments    Pt was asleep in side lying upon arriving. He easily awakes and greets Web designer. Eager for OOB activity. Was able to exit R side of bed without physical assistance however heavy use of bed rails with increased time to perform. Vcs throughout for technique improvements. Once seated EOB, pt stood to RW and ambulate ~ 5 ft to recliner. Breakfast tray arrived and was placed in front of pt. Will return later to advance gait per pt request. Pt overall tolerated session well. He continues to progress to PLOF. Still does not feel spouse can safely manage him at home. Recommend DC to SNF to continue to improve independence with ADLs.   Follow Up Recommendations  SNF     Equipment Recommendations  None recommended by PT       Precautions / Restrictions Precautions Precautions: Fall Restrictions Weight Bearing Restrictions: No    Mobility  Bed Mobility Overal bed mobility: Modified Independent Bed Mobility: Supine to Sit Rolling: Supervision Sidelying to sit: Supervision Supine to sit: Supervision;Modified independent (Device/Increase time)     General bed mobility comments: pt requires increased time and use of bed rail to roll to short sit. Was able to achieve without physical assistance.    Transfers Overall transfer level: Needs assistance Equipment used: Rolling walker (2 wheeled) Transfers: Sit to/from Stand Sit to Stand: Supervision;From elevated surface         General transfer comment: Pt was able to stand from slightly elevated bed height.  Ambulation/Gait Ambulation/Gait assistance: Min guard Gait Distance  (Feet): 5 Feet Assistive device: Rolling walker (2 wheeled) Gait Pattern/deviations: Narrow base of support;Trunk flexed;Step-through pattern;Scissoring Gait velocity: decreased   General Gait Details: Pt was able to ambulate 5 ft with RW from EOB to recliner. Limited distance due to pt's breakfast tray arriving.       Balance Overall balance assessment: Needs assistance Sitting-balance support: Feet supported;Bilateral upper extremity supported Sitting balance-Leahy Scale: Good     Standing balance support: Bilateral upper extremity supported Standing balance-Leahy Scale: Good        Cognition Arousal/Alertness: Awake/alert Behavior During Therapy: WFL for tasks assessed/performed Overall Cognitive Status: Within Functional Limits for tasks assessed          General Comments: Pt was asleep upon arriving but easily awakes and was cooperative throughout. Sessiopn progression most limited by breakfast tray arriving             Pertinent Vitals/Pain Pain Assessment: 0-10 Pain Score: 4  Faces Pain Scale: Hurts a little bit Pain Location: L rib and back Pain Descriptors / Indicators: Discomfort;Squeezing Pain Intervention(s): Limited activity within patient's tolerance;Monitored during session;Repositioned     PT Goals (current goals can now be found in the care plan section) Acute Rehab PT Goals Patient Stated Goal: get better to go home ASAP Progress towards PT goals: Progressing toward goals    Frequency    7X/week      PT Plan Current plan remains appropriate       AM-PAC PT "6 Clicks" Mobility   Outcome Measure  Help needed turning from your back to your side while in a flat bed without using bedrails?: A Little  Help needed moving from lying on your back to sitting on the side of a flat bed without using bedrails?: A Little Help needed moving to and from a bed to a chair (including a wheelchair)?: A Little Help needed standing up from a chair using  your arms (e.g., wheelchair or bedside chair)?: A Little Help needed to walk in hospital room?: A Little Help needed climbing 3-5 steps with a railing? : A Little 6 Click Score: 18    End of Session Equipment Utilized During Treatment: Gait belt Activity Tolerance: Patient tolerated treatment well Patient left: in chair;with call bell/phone within reach;with chair alarm set Nurse Communication: Mobility status PT Visit Diagnosis: Other abnormalities of gait and mobility (R26.89);Muscle weakness (generalized) (M62.81);Difficulty in walking, not elsewhere classified (R26.2);Pain;Unsteadiness on feet (R26.81)     Time: 7618-4859 PT Time Calculation (min) (ACUTE ONLY): 26 min  Charges:  $Therapeutic Activity: 23-37 mins                     Julaine Fusi PTA 03/17/21, 9:40 AM

## 2021-03-17 NOTE — Progress Notes (Signed)
Occupational Therapy Treatment Patient Details Name: Jack [name] MRN: [id number] DOB: [birth year] Today's Date: 03/17/2021    History of present illness Jack [name] is an 87 y.o. male who is s/p L2-L3 kyphoplasty. PMH includes:  gastric reflux, AFIB, hypertension, hyperlipidemia, recent T12/L1 kyphoplasty, and possible CA (myeloma).   OT comments  Upon entering the room, pt supine in bed with wife present in the room. Pt has discharge orders in and OT discussing d/c recommendations and home set up with pt. Pt has all needed equipment at home. OT recommended use of BSC next to bed to conserve energy and secondary to increased pain which can limit pt's ability to safety transfer. Pt demonstrated bed mobility and ambulates to bathroom for toilet transfer onto elevated commode with close supervision. Pt making excellent progress this session and wife feels good about pt coming home and her ability to assist him. OT recommended HHOT at discharge to continue working on functional deficits and OT recommends close supervision when pt is ambulating in home. She verbalized understanding. All needs within reach upon exiting the room.   Follow Up Recommendations  Home health OT;Supervision - Intermittent    Equipment Recommendations  None recommended by OT       Precautions / Restrictions Precautions Precautions: Fall Restrictions Weight Bearing Restrictions: No       Mobility Bed Mobility Overal bed mobility: Needs Assistance Bed Mobility: Supine to Sit Rolling: Supervision Sidelying to sit: Supervision Supine to sit: Supervision     General bed mobility comments: supervision for safety with min cuing for technique but no physical assist.    Transfers Overall transfer level: Needs assistance Equipment used: Rolling walker (2 wheeled) Transfers: Sit to/from Stand Sit to Stand: Supervision;From elevated surface         General transfer comment: Pt was able to stand from  slightly elevated bed height.    Balance Overall balance assessment: Needs assistance Sitting-balance support: Feet supported;Bilateral upper extremity supported Sitting balance-Leahy Scale: Good     Standing balance support: Bilateral upper extremity supported Standing balance-Leahy Scale: Good Standing balance comment: ABle to perform R/L weight shifts and alternating marches                           ADL either performed or assessed with clinical judgement   ADL Overall ADL's : Needs assistance/impaired                         Toilet Transfer: Supervision/safety;Comfort height toilet;Ambulation;RW                   Vision Baseline Vision/History: Wears glasses Patient Visual Report: No change from baseline            Cognition Arousal/Alertness: Awake/alert Behavior During Therapy: WFL for tasks assessed/performed Overall Cognitive Status: Within Functional Limits for tasks assessed                                 General Comments: Pt was asleep upon arriving but easily awakes and was cooperative throughout. Sessiopn progression most limited by breakfast tray arriving                   Pertinent Vitals/ Pain       Pain Assessment: 0-10 Pain Score: 2  Faces Pain Scale: Hurts a little bit Pain Location: L rib and back  Pain Descriptors / Indicators: Discomfort Pain Intervention(s): Limited activity within patient's tolerance;Monitored during session;Repositioned;Premedicated before session   Frequency  Min 1X/week        Progress Toward Goals  OT Goals(current goals can now be found in the care plan section)  Progress towards OT goals: Progressing toward goals  Acute Rehab OT Goals Patient Stated Goal: go home this afternoon OT Goal Formulation: With patient Time For Goal Achievement: 03/27/21 Potential to Achieve Goals: Good  Plan Discharge plan needs to be updated;Frequency remains appropriate        AM-PAC OT "6 Clicks" Daily Activity     Outcome Measure   Help from another person eating meals?: None Help from another person taking care of personal grooming?: None Help from another person toileting, which includes using toliet, bedpan, or urinal?: A Little Help from another person bathing (including washing, rinsing, drying)?: A Little Help from another person to put on and taking off regular upper body clothing?: A Little Help from another person to put on and taking off regular lower body clothing?: A Little 6 Click Score: 20    End of Session Equipment Utilized During Treatment: Rolling walker  OT Visit Diagnosis: Other abnormalities of gait and mobility (R26.89);Pain   Activity Tolerance Patient limited by pain   Patient Left in bed;with call bell/phone within reach;with bed alarm set   Nurse Communication Mobility status        Time: 0786-7544 OT Time Calculation (min): 16 min  Charges: OT General Charges $OT Visit: 1 Visit OT Treatments $Self Care/Home Management : 8-22 mins  Darleen Crocker, MS, OTR/L , CBIS ascom [phone]  03/17/21, 4:01 PM

## 2021-03-18 LAB — SURGICAL PATHOLOGY

## 2021-03-20 ENCOUNTER — Telehealth: Payer: Self-pay | Admitting: Nurse Practitioner

## 2021-03-20 ENCOUNTER — Encounter: Payer: Self-pay | Admitting: Oncology

## 2021-03-20 DIAGNOSIS — C9000 Multiple myeloma not having achieved remission: Secondary | ICD-10-CM

## 2021-03-20 MED FILL — LIDOCAINE 5% OINTMENT: 30 days supply | Fill #0

## 2021-03-20 NOTE — Telephone Encounter (Signed)
Reviewed pathology with patient. Findings consistent with multiple myeloma. PET scan scheduled for next week. Patient scheduled to see Dr. Grayland Ormond on 7/19 for results and treatment planning.

## 2021-03-23 ENCOUNTER — Telehealth: Payer: Self-pay | Admitting: *Deleted

## 2021-03-23 NOTE — Telephone Encounter (Signed)
[facility] called they need orders for Home Health Physical Therapy and Home Health Aid PT 2 times a week for 3 weeks 1 time a week for 2 weeks  Aid 2 times a week for 1 week.

## 2021-03-24 ENCOUNTER — Other Ambulatory Visit: Payer: Self-pay

## 2021-03-24 ENCOUNTER — Telehealth: Payer: Self-pay | Admitting: *Deleted

## 2021-03-24 ENCOUNTER — Ambulatory Visit
Admission: RE | Admit: 2021-03-24 | Discharge: 2021-03-24 | Disposition: A | Discharge status: Home / Self Care | Payer: PPO | Source: Ambulatory Visit | Attending: Oncology | Admitting: Oncology

## 2021-03-24 DIAGNOSIS — Z7901 Long term (current) use of anticoagulants: Secondary | ICD-10-CM | POA: Diagnosis not present

## 2021-03-24 DIAGNOSIS — Z87891 Personal history of nicotine dependence: Secondary | ICD-10-CM | POA: Diagnosis not present

## 2021-03-24 DIAGNOSIS — Z8249 Family history of ischemic heart disease and other diseases of the circulatory system: Secondary | ICD-10-CM | POA: Diagnosis not present

## 2021-03-24 DIAGNOSIS — Z6824 Body mass index (BMI) 24.0-24.9, adult: Secondary | ICD-10-CM | POA: Diagnosis not present

## 2021-03-24 DIAGNOSIS — M545 Low back pain, unspecified: Secondary | ICD-10-CM | POA: Diagnosis not present

## 2021-03-24 DIAGNOSIS — I2510 Atherosclerotic heart disease of native coronary artery without angina pectoris: Secondary | ICD-10-CM | POA: Insufficient documentation

## 2021-03-24 DIAGNOSIS — R109 Unspecified abdominal pain: Secondary | ICD-10-CM | POA: Diagnosis not present

## 2021-03-24 DIAGNOSIS — E43 Unspecified severe protein-calorie malnutrition: Secondary | ICD-10-CM | POA: Diagnosis present

## 2021-03-24 DIAGNOSIS — I129 Hypertensive chronic kidney disease with stage 1 through stage 4 chronic kidney disease, or unspecified chronic kidney disease: Secondary | ICD-10-CM | POA: Diagnosis present

## 2021-03-24 DIAGNOSIS — I712 Thoracic aortic aneurysm, without rupture: Secondary | ICD-10-CM | POA: Insufficient documentation

## 2021-03-24 DIAGNOSIS — N1832 Chronic kidney disease, stage 3b: Secondary | ICD-10-CM | POA: Diagnosis present

## 2021-03-24 DIAGNOSIS — Z515 Encounter for palliative care: Secondary | ICD-10-CM | POA: Diagnosis not present

## 2021-03-24 DIAGNOSIS — Z20822 Contact with and (suspected) exposure to covid-19: Secondary | ICD-10-CM | POA: Diagnosis present

## 2021-03-24 DIAGNOSIS — K449 Diaphragmatic hernia without obstruction or gangrene: Secondary | ICD-10-CM | POA: Insufficient documentation

## 2021-03-24 DIAGNOSIS — C9000 Multiple myeloma not having achieved remission: Secondary | ICD-10-CM | POA: Diagnosis present

## 2021-03-24 DIAGNOSIS — J439 Emphysema, unspecified: Secondary | ICD-10-CM | POA: Insufficient documentation

## 2021-03-24 DIAGNOSIS — N400 Enlarged prostate without lower urinary tract symptoms: Secondary | ICD-10-CM | POA: Diagnosis present

## 2021-03-24 DIAGNOSIS — J9601 Acute respiratory failure with hypoxia: Secondary | ICD-10-CM | POA: Diagnosis present

## 2021-03-24 DIAGNOSIS — T40425A Adverse effect of tramadol, initial encounter: Secondary | ICD-10-CM | POA: Diagnosis present

## 2021-03-24 DIAGNOSIS — Z79899 Other long term (current) drug therapy: Secondary | ICD-10-CM | POA: Diagnosis not present

## 2021-03-24 DIAGNOSIS — I700 Atherosclerosis of aorta: Secondary | ICD-10-CM | POA: Insufficient documentation

## 2021-03-24 DIAGNOSIS — G893 Neoplasm related pain (acute) (chronic): Secondary | ICD-10-CM | POA: Diagnosis present

## 2021-03-24 DIAGNOSIS — Z808 Family history of malignant neoplasm of other organs or systems: Secondary | ICD-10-CM | POA: Diagnosis not present

## 2021-03-24 DIAGNOSIS — K5900 Constipation, unspecified: Secondary | ICD-10-CM | POA: Diagnosis not present

## 2021-03-24 DIAGNOSIS — I10 Essential (primary) hypertension: Secondary | ICD-10-CM | POA: Diagnosis not present

## 2021-03-24 DIAGNOSIS — E785 Hyperlipidemia, unspecified: Secondary | ICD-10-CM | POA: Diagnosis present

## 2021-03-24 DIAGNOSIS — R1010 Upper abdominal pain, unspecified: Secondary | ICD-10-CM | POA: Diagnosis not present

## 2021-03-24 DIAGNOSIS — D472 Monoclonal gammopathy: Secondary | ICD-10-CM | POA: Insufficient documentation

## 2021-03-24 DIAGNOSIS — E781 Pure hyperglyceridemia: Secondary | ICD-10-CM | POA: Diagnosis present

## 2021-03-24 DIAGNOSIS — T402X5A Adverse effect of other opioids, initial encounter: Secondary | ICD-10-CM | POA: Diagnosis present

## 2021-03-24 DIAGNOSIS — M4854XA Collapsed vertebra, not elsewhere classified, thoracic region, initial encounter for fracture: Secondary | ICD-10-CM | POA: Diagnosis present

## 2021-03-24 DIAGNOSIS — Z5112 Encounter for antineoplastic immunotherapy: Secondary | ICD-10-CM | POA: Diagnosis present

## 2021-03-24 DIAGNOSIS — T426X5A Adverse effect of other antiepileptic and sedative-hypnotic drugs, initial encounter: Secondary | ICD-10-CM | POA: Diagnosis present

## 2021-03-24 DIAGNOSIS — I480 Paroxysmal atrial fibrillation: Secondary | ICD-10-CM | POA: Diagnosis present

## 2021-03-24 DIAGNOSIS — M546 Pain in thoracic spine: Secondary | ICD-10-CM | POA: Diagnosis not present

## 2021-03-24 DIAGNOSIS — K219 Gastro-esophageal reflux disease without esophagitis: Secondary | ICD-10-CM | POA: Diagnosis present

## 2021-03-24 DIAGNOSIS — S22000A Wedge compression fracture of unspecified thoracic vertebra, initial encounter for closed fracture: Secondary | ICD-10-CM | POA: Diagnosis not present

## 2021-03-24 DIAGNOSIS — C7951 Secondary malignant neoplasm of bone: Secondary | ICD-10-CM | POA: Diagnosis not present

## 2021-03-24 DIAGNOSIS — D6489 Other specified anemias: Secondary | ICD-10-CM | POA: Diagnosis present

## 2021-03-24 DIAGNOSIS — K5909 Other constipation: Secondary | ICD-10-CM | POA: Diagnosis present

## 2021-03-24 LAB — GLUCOSE, CAPILLARY: Glucose-Capillary: 91 mg/dL (ref 70–99)

## 2021-03-24 MED ORDER — FLUDEOXYGLUCOSE F - 18 (FDG) INJECTION
9.7800 | Freq: Once | INTRAVENOUS | Status: AC | PRN
  Administered 2021-03-24: 9.78 via INTRAVENOUS

## 2021-03-24 NOTE — Telephone Encounter (Signed)
Called therapist to inform her that orders were left on her voice mail

## 2021-03-24 NOTE — Telephone Encounter (Signed)
Verbal order left on Cindy's voicemail.

## 2021-03-24 NOTE — Telephone Encounter (Signed)
[facility] [employer] requesting more information regarding Lidocaine Cream ordered by Lurline Idol NP. Lennette Bihari and [phone]

## 2021-03-24 NOTE — Telephone Encounter (Signed)
Pete Glatter, NP called Jack [name] this morning.

## 2021-03-24 NOTE — Anesthesia Postprocedure Evaluation (Signed)
Anesthesia Post Note  Patient: Jack [name]  Procedure(s) Performed: KYPHOPLASTY, L2, L3  Patient location during evaluation: PACU Anesthesia Type: MAC Level of consciousness: awake and alert Pain management: pain level controlled Vital Signs Assessment: post-procedure vital signs reviewed and stable Respiratory status: spontaneous breathing, nonlabored ventilation, respiratory function stable and patient connected to nasal cannula oxygen Cardiovascular status: blood pressure returned to baseline and stable Postop Assessment: no apparent nausea or vomiting Anesthetic complications: no   No notable events documented.   Last Vitals:  Vitals:   03/17/21 1152 03/17/21 1609  BP: (!) 145/91 119/78  Pulse: 79 71  Resp: 18 14  Temp: 36.4 C 36.5 C  SpO2: 98% 94%    Last Pain:  Vitals:   03/17/21 1609  TempSrc: Oral  PainSc:                  Molli Barrows

## 2021-03-24 NOTE — Telephone Encounter (Signed)
PT called again today they would like to start therapy this week but do not have orders.

## 2021-03-25 ENCOUNTER — Other Ambulatory Visit: Payer: Self-pay | Admitting: Hospice and Palliative Medicine

## 2021-03-25 MED ORDER — OXYCODONE HCL ER 10 MG PO T12A: 10.0000 mg | EXTENDED_RELEASE_TABLET | Freq: Two times a day (BID) | ORAL | 0 refills | Status: DC

## 2021-03-25 MED ORDER — OXYCODONE-ACETAMINOPHEN 5-325 MG PO TABS: 1.0000 | ORAL_TABLET | ORAL | 0 refills | Status: DC | PRN

## 2021-03-25 MED ORDER — NALOXONE HCL 4 MG/0.1ML NA LIQD: NASAL | 0 refills | Status: AC

## 2021-03-25 MED ORDER — ONDANSETRON HCL 8 MG PO TABS: 8.0000 mg | ORAL_TABLET | Freq: Three times a day (TID) | ORAL | 2 refills | Status: DC | PRN

## 2021-03-25 MED FILL — NALOXONE HCL 4 MG NASAL SPRAY: 2 days supply | Qty: 2 | Fill #0

## 2021-03-25 MED FILL — NALOXONE HCL 4 MG NASAL SPRAY: 1 days supply | Qty: 2 | Fill #0

## 2021-03-25 MED FILL — OXYCONTIN ER 10 MG TABLET: 30 days supply | Qty: 60 | Fill #0

## 2021-03-25 MED FILL — OXYCODONE-ACETAMINOPHEN 5-325: 5 days supply | Qty: 60 | Fill #0

## 2021-03-25 MED FILL — ONDANSETRON HCL 8 MG TABLET: 20 days supply | Qty: 60 | Fill #0

## 2021-03-25 NOTE — Progress Notes (Signed)
Received a call from patient's wife that they have been unable to get the OxyContin as the pharmacy did not have it in stock.  Reportedly, the pharmacy has the OxyContin 10 mg in stock and she requested me to send a new prescription for that dose.  She also requested that I refill the Percocet.  Reportedly, patient is still having severe pain and is requiring Percocet every 4 hours around-the-clock.

## 2021-03-26 ENCOUNTER — Inpatient Hospital Stay
Admission: EM | Admit: 2021-03-26 | Discharge: 2021-04-07 | DRG: 840 | Disposition: A | Discharge status: Home Health | Payer: PPO | Attending: Hospitalist | Admitting: Hospitalist

## 2021-03-26 ENCOUNTER — Telehealth: Payer: Self-pay | Admitting: *Deleted

## 2021-03-26 ENCOUNTER — Emergency Department: Payer: PPO

## 2021-03-26 ENCOUNTER — Other Ambulatory Visit: Payer: Self-pay

## 2021-03-26 ENCOUNTER — Inpatient Hospital Stay: Payer: PPO

## 2021-03-26 ENCOUNTER — Other Ambulatory Visit: Payer: Self-pay | Admitting: Internal Medicine

## 2021-03-26 ENCOUNTER — Encounter: Payer: Self-pay | Admitting: Emergency Medicine

## 2021-03-26 ENCOUNTER — Inpatient Hospital Stay: Payer: PPO | Attending: Hospice and Palliative Medicine | Admitting: Hospice and Palliative Medicine

## 2021-03-26 VITALS — BP 145/99 | HR 72 | Temp 97.9°F | Resp 20

## 2021-03-26 DIAGNOSIS — C9000 Multiple myeloma not having achieved remission: Secondary | ICD-10-CM

## 2021-03-26 DIAGNOSIS — Z5112 Encounter for antineoplastic immunotherapy: Secondary | ICD-10-CM | POA: Insufficient documentation

## 2021-03-26 DIAGNOSIS — G893 Neoplasm related pain (acute) (chronic): Secondary | ICD-10-CM | POA: Diagnosis present

## 2021-03-26 DIAGNOSIS — D6489 Other specified anemias: Secondary | ICD-10-CM | POA: Diagnosis present

## 2021-03-26 DIAGNOSIS — Z6824 Body mass index (BMI) 24.0-24.9, adult: Secondary | ICD-10-CM

## 2021-03-26 DIAGNOSIS — I10 Essential (primary) hypertension: Secondary | ICD-10-CM | POA: Diagnosis present

## 2021-03-26 DIAGNOSIS — R1010 Upper abdominal pain, unspecified: Secondary | ICD-10-CM

## 2021-03-26 DIAGNOSIS — N400 Enlarged prostate without lower urinary tract symptoms: Secondary | ICD-10-CM | POA: Diagnosis present

## 2021-03-26 DIAGNOSIS — K5900 Constipation, unspecified: Secondary | ICD-10-CM | POA: Diagnosis present

## 2021-03-26 DIAGNOSIS — Z7901 Long term (current) use of anticoagulants: Secondary | ICD-10-CM

## 2021-03-26 DIAGNOSIS — M4854XA Collapsed vertebra, not elsewhere classified, thoracic region, initial encounter for fracture: Secondary | ICD-10-CM | POA: Diagnosis present

## 2021-03-26 DIAGNOSIS — I480 Paroxysmal atrial fibrillation: Secondary | ICD-10-CM | POA: Diagnosis present

## 2021-03-26 DIAGNOSIS — J9601 Acute respiratory failure with hypoxia: Secondary | ICD-10-CM | POA: Diagnosis not present

## 2021-03-26 DIAGNOSIS — T40425A Adverse effect of tramadol, initial encounter: Secondary | ICD-10-CM | POA: Diagnosis present

## 2021-03-26 DIAGNOSIS — T426X5A Adverse effect of other antiepileptic and sedative-hypnotic drugs, initial encounter: Secondary | ICD-10-CM | POA: Diagnosis present

## 2021-03-26 DIAGNOSIS — S22000A Wedge compression fracture of unspecified thoracic vertebra, initial encounter for closed fracture: Secondary | ICD-10-CM

## 2021-03-26 DIAGNOSIS — Z87891 Personal history of nicotine dependence: Secondary | ICD-10-CM

## 2021-03-26 DIAGNOSIS — K5909 Other constipation: Secondary | ICD-10-CM | POA: Diagnosis present

## 2021-03-26 DIAGNOSIS — Z515 Encounter for palliative care: Secondary | ICD-10-CM | POA: Insufficient documentation

## 2021-03-26 DIAGNOSIS — E785 Hyperlipidemia, unspecified: Secondary | ICD-10-CM | POA: Diagnosis present

## 2021-03-26 DIAGNOSIS — S22000S Wedge compression fracture of unspecified thoracic vertebra, sequela: Secondary | ICD-10-CM

## 2021-03-26 DIAGNOSIS — E43 Unspecified severe protein-calorie malnutrition: Secondary | ICD-10-CM | POA: Diagnosis present

## 2021-03-26 DIAGNOSIS — Z20822 Contact with and (suspected) exposure to covid-19: Secondary | ICD-10-CM | POA: Diagnosis present

## 2021-03-26 DIAGNOSIS — Z8249 Family history of ischemic heart disease and other diseases of the circulatory system: Secondary | ICD-10-CM

## 2021-03-26 DIAGNOSIS — I129 Hypertensive chronic kidney disease with stage 1 through stage 4 chronic kidney disease, or unspecified chronic kidney disease: Secondary | ICD-10-CM | POA: Diagnosis present

## 2021-03-26 DIAGNOSIS — Z808 Family history of malignant neoplasm of other organs or systems: Secondary | ICD-10-CM

## 2021-03-26 DIAGNOSIS — E781 Pure hyperglyceridemia: Secondary | ICD-10-CM | POA: Diagnosis present

## 2021-03-26 DIAGNOSIS — M5414 Radiculopathy, thoracic region: Secondary | ICD-10-CM | POA: Diagnosis present

## 2021-03-26 DIAGNOSIS — N1832 Chronic kidney disease, stage 3b: Secondary | ICD-10-CM | POA: Diagnosis present

## 2021-03-26 DIAGNOSIS — R109 Unspecified abdominal pain: Secondary | ICD-10-CM | POA: Diagnosis present

## 2021-03-26 DIAGNOSIS — K219 Gastro-esophageal reflux disease without esophagitis: Secondary | ICD-10-CM | POA: Diagnosis present

## 2021-03-26 DIAGNOSIS — Z79899 Other long term (current) drug therapy: Secondary | ICD-10-CM

## 2021-03-26 DIAGNOSIS — T402X5A Adverse effect of other opioids, initial encounter: Secondary | ICD-10-CM | POA: Diagnosis present

## 2021-03-26 LAB — COMPREHENSIVE METABOLIC PANEL
ALT: 25 U/L (ref 0–44)
AST: 41 U/L (ref 15–41)
Albumin: 3.8 g/dL (ref 3.5–5.0)
Alkaline Phosphatase: 104 U/L (ref 38–126)
Anion gap: 13 (ref 5–15)
BUN: 29 mg/dL — ABNORMAL HIGH (ref 8–23)
CO2: 22 mmol/L (ref 22–32)
Calcium: 9.2 mg/dL (ref 8.9–10.3)
Chloride: 100 mmol/L (ref 98–111)
Creatinine, Ser: 1.15 mg/dL (ref 0.61–1.24)
GFR, Estimated: >60 mL/min (ref >60)
Glucose, Bld: 98 mg/dL (ref 70–99)
Potassium: 4.2 mmol/L (ref 3.5–5.1)
Sodium: 135 mmol/L (ref 135–145)
Total Bilirubin: 1.4 mg/dL — ABNORMAL HIGH (ref 0.3–1.2)
Total Protein: 6 g/dL — ABNORMAL LOW (ref 6.5–8.1)

## 2021-03-26 LAB — URINALYSIS, COMPLETE (UACMP) WITH MICROSCOPIC
Bilirubin Urine: NEGATIVE
Glucose, UA: NEGATIVE mg/dL
Hgb urine dipstick: NEGATIVE
Ketones, ur: NEGATIVE mg/dL
Leukocytes,Ua: NEGATIVE
Nitrite: NEGATIVE
Protein, ur: NEGATIVE mg/dL
Specific Gravity, Urine: 1.025 (ref 1.005–1.030)
pH: 9 — ABNORMAL HIGH (ref 5.0–8.0)

## 2021-03-26 LAB — LACTIC ACID, PLASMA: Lactic Acid, Venous: 4 mmol/L (ref 0.5–1.9)

## 2021-03-26 LAB — CBC WITH DIFFERENTIAL/PLATELET
Abs Immature Granulocytes: 0.03 10*3/uL (ref 0.00–0.07)
Basophils Absolute: 0 10*3/uL (ref 0.0–0.1)
Basophils Relative: 0 %
Eosinophils Absolute: 0 10*3/uL (ref 0.0–0.5)
Eosinophils Relative: 0 %
HCT: 38.5 % — ABNORMAL LOW (ref 39.0–52.0)
Hemoglobin: 12.2 g/dL — ABNORMAL LOW (ref 13.0–17.0)
Immature Granulocytes: 0 %
Lymphocytes Relative: 20 %
Lymphs Abs: 1.4 10*3/uL (ref 0.7–4.0)
MCH: 31.8 pg (ref 26.0–34.0)
MCHC: 31.7 g/dL (ref 30.0–36.0)
MCV: 100.3 fL — ABNORMAL HIGH (ref 80.0–100.0)
Monocytes Absolute: 0.3 10*3/uL (ref 0.1–1.0)
Monocytes Relative: 4 %
Neutro Abs: 5.2 10*3/uL (ref 1.7–7.7)
Neutrophils Relative %: 76 %
Platelets: 247 10*3/uL (ref 150–400)
RBC: 3.84 MIL/uL — ABNORMAL LOW (ref 4.22–5.81)
RDW: 16.6 % — ABNORMAL HIGH (ref 11.5–15.5)
WBC: 6.9 10*3/uL (ref 4.0–10.5)
nRBC: 0 % (ref 0.0–0.2)

## 2021-03-26 LAB — RESP PANEL BY RT-PCR (FLU A&B, COVID) ARPGX2
Influenza A by PCR: NEGATIVE
Influenza B by PCR: NEGATIVE
SARS Coronavirus 2 by RT PCR: NEGATIVE

## 2021-03-26 LAB — LIPASE, BLOOD: Lipase: 32 U/L (ref 11–51)

## 2021-03-26 MED ORDER — ONDANSETRON HCL 4 MG PO TABS: 4.0000 mg | ORAL_TABLET | Freq: Four times a day (QID) | ORAL | Status: DC | PRN

## 2021-03-26 MED ORDER — MORPHINE SULFATE (PF) 2 MG/ML IV SOLN
2.0000 mg | Freq: Once | INTRAVENOUS | Status: AC
  Administered 2021-03-26: 2 mg via INTRAVENOUS
  Filled 2021-03-26: qty 1

## 2021-03-26 MED ORDER — MORPHINE SULFATE (PF) 2 MG/ML IV SOLN: 2.0000 mg | Freq: Once | INTRAVENOUS | Status: DC

## 2021-03-26 MED ORDER — ACETAMINOPHEN 650 MG RE SUPP: 650.0000 mg | Freq: Four times a day (QID) | RECTAL | Status: AC | PRN

## 2021-03-26 MED ORDER — HYDROMORPHONE HCL 1 MG/ML IJ SOLN
0.5000 mg | Freq: Once | INTRAMUSCULAR | Status: AC
  Administered 2021-03-26: 0.5 mg via INTRAVENOUS
  Filled 2021-03-26: qty 1

## 2021-03-26 MED ORDER — IOHEXOL 350 MG/ML SOLN
100.0000 mL | Freq: Once | INTRAVENOUS | Status: AC | PRN
  Administered 2021-03-26: 100 mL via INTRAVENOUS

## 2021-03-26 MED ORDER — MORPHINE SULFATE (PF) 4 MG/ML IV SOLN
8.0000 mg | Freq: Once | INTRAVENOUS | Status: AC
  Administered 2021-03-26: 8 mg via INTRAVENOUS
  Filled 2021-03-26: qty 2

## 2021-03-26 MED ORDER — GABAPENTIN 100 MG PO CAPS
100.0000 mg | ORAL_CAPSULE | Freq: Three times a day (TID) | ORAL | Status: DC
  Administered 2021-03-27: 100 mg via ORAL
  Filled 2021-03-26: qty 1

## 2021-03-26 MED ORDER — ACETAMINOPHEN 325 MG PO TABS: 650.0000 mg | ORAL_TABLET | Freq: Four times a day (QID) | ORAL | Status: AC | PRN

## 2021-03-26 MED ORDER — ONDANSETRON HCL 4 MG/2ML IJ SOLN
4.0000 mg | Freq: Four times a day (QID) | INTRAMUSCULAR | Status: DC | PRN
  Filled 2021-03-26: qty 2

## 2021-03-26 MED ORDER — ATORVASTATIN CALCIUM 20 MG PO TABS
40.0000 mg | ORAL_TABLET | Freq: Every day | ORAL | Status: DC
  Administered 2021-03-27 – 2021-04-06 (×10): 40 mg via ORAL
  Filled 2021-03-26 (×11): qty 2

## 2021-03-26 MED ORDER — KETOROLAC TROMETHAMINE 15 MG/ML IJ SOLN
15.0000 mg | Freq: Four times a day (QID) | INTRAMUSCULAR | Status: DC | PRN
  Administered 2021-03-26 – 2021-03-27 (×2): 15 mg via INTRAVENOUS
  Filled 2021-03-26 (×2): qty 1

## 2021-03-26 MED ORDER — DEXAMETHASONE 4 MG PO TABS
4.0000 mg | ORAL_TABLET | Freq: Two times a day (BID) | ORAL | Status: DC
  Filled 2021-03-26: qty 1

## 2021-03-26 MED ORDER — CARVEDILOL 25 MG PO TABS
25.0000 mg | ORAL_TABLET | Freq: Two times a day (BID) | ORAL | Status: DC
  Administered 2021-03-27 – 2021-04-06 (×18): 25 mg via ORAL
  Filled 2021-03-26 (×22): qty 1

## 2021-03-26 MED ORDER — APIXABAN 2.5 MG PO TABS
2.5000 mg | ORAL_TABLET | Freq: Two times a day (BID) | ORAL | Status: DC
  Administered 2021-03-27 – 2021-03-30 (×7): 2.5 mg via ORAL
  Filled 2021-03-26 (×7): qty 1

## 2021-03-26 MED ORDER — ENOXAPARIN SODIUM 40 MG/0.4ML IJ SOSY
40.0000 mg | PREFILLED_SYRINGE | INTRAMUSCULAR | Status: DC
  Administered 2021-03-26: 40 mg via SUBCUTANEOUS
  Filled 2021-03-26: qty 0.4

## 2021-03-26 MED ORDER — ONDANSETRON HCL 4 MG/2ML IJ SOLN
4.0000 mg | Freq: Once | INTRAMUSCULAR | Status: AC
  Administered 2021-03-26: 4 mg via INTRAVENOUS
  Filled 2021-03-26: qty 2

## 2021-03-26 MED ORDER — OXYCODONE HCL 5 MG PO TABS: 10.0000 mg | ORAL_TABLET | Freq: Once | ORAL | Status: DC

## 2021-03-26 MED ORDER — SENNOSIDES-DOCUSATE SODIUM 8.6-50 MG PO TABS
1.0000 | ORAL_TABLET | Freq: Two times a day (BID) | ORAL | Status: DC
  Administered 2021-03-27 – 2021-04-07 (×17): 1 via ORAL
  Filled 2021-03-26 (×18): qty 1

## 2021-03-26 MED ORDER — POLYETHYLENE GLYCOL 3350 17 G PO PACK
17.0000 g | PACK | Freq: Every day | ORAL | Status: DC
  Administered 2021-03-27: 17 g via ORAL
  Filled 2021-03-26: qty 1

## 2021-03-26 MED ORDER — IOHEXOL 9 MG/ML PO SOLN
500.0000 mL | ORAL | Status: AC
  Administered 2021-03-26: 500 mL via ORAL

## 2021-03-26 MED ORDER — LIDOCAINE 5 % EX PTCH
1.0000 | MEDICATED_PATCH | CUTANEOUS | Status: DC
  Administered 2021-03-26 – 2021-04-06 (×12): 1 via TRANSDERMAL
  Filled 2021-03-26 (×15): qty 1

## 2021-03-26 NOTE — Progress Notes (Addendum)
[facility]pt[facility] Management [place]  [phone] [phone]  Patient Care Team: [name], MD as PCP - General (Family Medicine) End, [name], MD as PCP - Cardiology (Cardiology) [name], MD (Endocrinology)   Name of the patient: Jack [name]  [id number]  23-Dec-1932   Date of visit: 03/26/21  Reason for Consult: Jack [name] is a 87 y.o. male with multiple medical problems including recently diagnosed multiple myeloma who is not yet on treatment, hypertension, CKD stage IIIb, A. fib on Eliquis, and anemia.  Patient was hospitalized 01/02/2021 to 01/07/2021 with T12 compression fracture with recommendation for conservative management including use of a TLSO brace.  Patient was hospitalized again 01/29/2021 to 02/07/2021 with recurrent back pain and MR showing T12 and new L1 compression fracture.  Patient underwent T12/L1 kyphoplasty on 02/02/2021.  There was also suspicion of a left fifth rib fracture, which occurred while transitioning from the stretcher for his kyphoplasty.   He was readmitted 03/11/2021 to 03/17/2021 with worsening back pain.  CT revealed new compression fractures of L2 and L3. Patient underwent kyphoplasty on 03/12/2021.   Patient presents to the [facility] today with complaint of a new right flank mass.  This mass was noted by the physical therapist who saw him today from home health.  Patient currently endorses significant back pain but he has not taken his pain medication since early this a.m.  He had constipation but this was improved today after he took mag citrate.  No fever or chills.  No urinary symptoms.  Denies any neurologic complaints. Denies recent fevers or illnesses. Denies any easy bleeding or bruising. Reports good appetite and denies weight loss. Denies chest pain. Denies any nausea, vomiting, constipation, or diarrhea. Denies urinary complaints. Patient offers no further specific complaints  today.  PAST MEDICAL HISTORY: Past Medical History:  Diagnosis Date   Elevated prostate specific antigen (PSA)    has been 7 for a year    GERD (gastroesophageal reflux disease)    History of colon polyps 2008   [facility],    History of kidney stones    Hyperlipidemia    Hypertension    Prostate hypertrophy    diagnosed at age 27 due to hematospermia   Renal disorder     PAST SURGICAL HISTORY:  Past Surgical History:  Procedure Laterality Date   CATARACT EXTRACTION W/PHACO Left 01/10/2018   Procedure: CATARACT EXTRACTION PHACO AND INTRAOCULAR LENS PLACEMENT ([place]);  Surgeon: Birder Robson, MD;  Location: [facility] ORS;  Service: Ophthalmology;  Laterality: Left;  [name] 00:24.8 AP% 14.9 CDE 3.68 Fluid pack lot # [id number]   CATARACT EXTRACTION W/PHACO Right 01/25/2018   Procedure: CATARACT EXTRACTION PHACO AND INTRAOCULAR LENS PLACEMENT ([facility]);  Surgeon: Birder Robson, MD;  Location: [facility] OR[facility];  Service: Ophthalmology;  Laterality: Right;  [name] 00:42 AP% 10.8 CDE 4.59 Fluid pack lot # [id number]   COLON SURGERY     CYSTOSCOPY W/ URETERAL STENT PLACEMENT Right 10/16/2017   Procedure: right  URETERAL STENT PLACEMENT,cystoscopy bilateral stent removal,rretrograde;  Surgeon: Abbie Sons, MD;  Location: [facility] ORS;  Service: Urology;  Laterality: Right;   CYSTOSCOPY/URETEROSCOPY/HOLMIUM LASER/STENT PLACEMENT Right 12/16/2020   Procedure: CYSTOSCOPY/URETEROSCOPY/HOLMIUM LASER/STENT PLACEMENT;  Surgeon: Abbie Sons, MD;  Location: [facility] ORS;  Service: Urology;  Laterality: Right;   EXTRACORPOREAL SHOCK WAVE LITHOTRIPSY Right 12/11/2020   Procedure: EXTRACORPOREAL SHOCK WAVE LITHOTRIPSY (ESWL);  Surgeon: Abbie Sons, MD;  Location: [facility] OR[facility];  Service: Urology;  Laterality: Right;   IR  KYPHO EA ADDL LEVEL THORACIC OR LUMBAR  02/02/2021   IR KYPHO LUMBAR INC FX REDUCE BONE BX UNI/BIL CANNULATION INC/IMAGING  02/02/2021   KIDNEY STONE SURGERY     KYPHOPLASTY N/A 03/12/2021   Procedure:  Hewitt Shorts, L3;  Surgeon: Hessie Knows, MD;  Location: [facility] OR[facility];  Service: Orthopedics;  Laterality: N/A;   RESECTION SOFT TISSUE TUMOR LEG / ANKLE RADICAL  jan 2009   [facility],  right thigh/knee , nonmalignant   SMALL INTESTINE SURGERY  1946   implaed on picket fence, punctured stomach   TONSILLECTOMY      HEMATOLOGY/ONCOLOGY HISTORY:  Oncology History   No history exists.    ALLERGIES:  is allergic to quinolones, amlodipine, azithromycin, lisinopril, and morphine and related.  MEDICATIONS:  Current Outpatient Medications  Medication Sig Dispense Refill   acetaminophen (TYLENOL) 500 MG tablet Take 2 tablets (1,000 mg total) by mouth every 8 (eight) hours. 30 tablet 0   apixaban (ELIQUIS) 2.5 MG TABS tablet Take 1 tablet (2.5 mg total) by mouth 2 (two) times daily. 60 tablet    atorvastatin (LIPITOR) 40 MG tablet Take 1 tablet (40 mg total) by mouth daily at 6 PM. 90 tablet 3   baclofen (LIORESAL) 10 MG tablet Take 10 mg by mouth 3 (three) times daily.     bisacodyl (DULCOLAX) 5 MG EC tablet Take 1 tablet (5 mg total) by mouth daily as needed for moderate constipation. 30 tablet 0   calcitonin, salmon, (MIACALCIN/FORTICAL) 200 UNIT/ACT nasal spray Place 1 spray into alternate nostrils daily. 3.7 mL 12   carvedilol (COREG) 25 MG tablet Take 1 tablet (25 mg total) by mouth 2 (two) times daily with a meal. 60 tablet 1   dexamethasone (DECADRON) 4 MG tablet Take 1 tablet (4 mg total) by mouth every 12 (twelve) hours. 30 tablet 0   docusate sodium (COLACE) 100 MG capsule Take 1 capsule (100 mg total) by mouth 2 (two) times daily. 60 capsule 0   gabapentin (NEURONTIN) 100 MG capsule Take 100 mg by mouth 3 (three) times daily.     lidocaine (XYLOCAINE) 5 % ointment Apply topically 3 (three) times daily as needed for mild pain or moderate pain. 35.44 g 0   naloxone (NARCAN) nasal spray 4 mg/0.1 mL SPRAY 1 SPRAY INTO ONE NOSTRIL AS DIRECTED FOR OPIOID OVERDOSE (TURN PERSON ON SIDE AFTER DOSE.  IF NO RESPONSE IN 2-3 MINUTES OR PERSON RESPONDS BUT RELAPSES, REPEAT USING A NEW SPRAY DEVICE AND SPRAY INTO THE OTHER NOSTRIL. CALL 911 AFTER USE.) * EMERGENCY USE ONLY * 1 each 0   ondansetron (ZOFRAN) 8 MG tablet Take 1 tablet (8 mg total) by mouth every 8 (eight) hours as needed for nausea or vomiting. 60 tablet 2   oxyCODONE (OXYCONTIN) 10 mg 12 hr tablet Take 1 tablet (10 mg total) by mouth every 12 (twelve) hours. 60 tablet 0   oxyCODONE-acetaminophen (PERCOCET/ROXICET) 5-325 MG tablet Take 1-2 tablets by mouth every 4 (four) hours as needed for severe pain. 60 tablet 0   senna (SENOKOT) 8.6 MG TABS tablet Take 2 tablets by mouth at bedtime.     senna-docusate (SENOKOT-S) 8.6-50 MG tablet Take 1 tablet by mouth 2 (two) times daily. 30 tablet 0   No current facility-administered medications for this visit.    VITAL SIGNS: There were no vitals taken for this visit. There were no vitals filed for this visit.  Estimated body mass index is 26.58 kg/m as calculated from the following:   Height  as of 03/11/21: '5\' 9"'  (1.753 m).   Weight as of 03/11/21: 180 lb (81.6 kg).  LABS: CBC:    Component Value Date/Time   WBC 3.4 (L) 03/12/2021 0730   HGB 11.5 (L) 03/12/2021 0730   HGB 11.0 (L) 02/24/2021 1014   HCT 34.3 (L) 03/12/2021 0730   HCT 35.3 (L) 02/24/2021 1014   PLT 239 03/12/2021 0730   PLT 249 02/24/2021 1014   MCV 91.7 03/12/2021 0730   MCV 96 02/24/2021 1014   MCV 90 01/24/2013 0819   NEUTROABS 2.6 03/11/2021 0829   NEUTROABS 7.4 (H) 01/24/2013 0819   LYMPHSABS 1.3 03/11/2021 0829   LYMPHSABS 1.2 01/24/2013 0819   MONOABS 0.2 03/11/2021 0829   MONOABS 0.3 01/24/2013 0819   EOSABS 0.0 03/11/2021 0829   EOSABS 0.0 01/24/2013 0819   BASOSABS 0.0 03/11/2021 0829   BASOSABS 0.0 01/24/2013 0819   Comprehensive Metabolic Panel:    Component Value Date/Time   NA 137 03/17/2021 0506   NA 136 02/24/2021 1014   NA 138 01/24/2013 0819   K 4.5 03/17/2021 0506   K 3.8  01/24/2013 0819   CL 101 03/17/2021 0506   CL 105 01/24/2013 0819   CO2 29 03/17/2021 0506   CO2 25 01/24/2013 0819   BUN 46 (H) 03/17/2021 0506   BUN 23 02/24/2021 1014   BUN 20 (H) 01/24/2013 0819   CREATININE 1.32 (H) 03/17/2021 0506   CREATININE 1.52 (H) 01/24/2013 0819   GLUCOSE 128 (H) 03/17/2021 0506   GLUCOSE 171 (H) 01/24/2013 0819   CALCIUM 9.6 03/17/2021 0506   CALCIUM 10.4 (H) 02/19/2021 1229   AST 23 03/11/2021 0829   AST 18 01/24/2013 0819   ALT 8 03/11/2021 0829   ALT 15 01/24/2013 0819   ALKPHOS 149 (H) 03/11/2021 0829   ALKPHOS 74 01/24/2013 0819   BILITOT 1.0 03/11/2021 0829   BILITOT 0.4 02/24/2021 1014   BILITOT 0.5 01/24/2013 0819   PROT 6.7 03/11/2021 0829   PROT 6.2 02/24/2021 1014   PROT 6.3 (L) 01/24/2013 0819   ALBUMIN 4.1 03/11/2021 0829   ALBUMIN 4.5 02/24/2021 1014   ALBUMIN 3.6 01/24/2013 0819    RADIOGRAPHIC STUDIES: DG Lumbar Spine 2-3 Views  Result Date: 03/12/2021 CLINICAL DATA:  Provided history: Fracture. Additional history provided: L2-L3 kyphoplasty. Provided fluoroscopy time 2 minutes, 27 seconds. EXAM: LUMBAR SPINE - 2-3 VIEW; DG C-ARM 1-60 MIN COMPARISON:  CT abdomen/pelvis 03/11/2021. FINDINGS: AP and lateral view intraprocedural fluoroscopic images of the lumbar spine are submitted, 2 images total. On the provided images, kyphoplasty material is present within three contiguous lumbar vertebral bodies (these are presumably the L1, L2 and L3 vertebral bodies given the provided history and findings on prior imaging. However, neither the thoracolumbar nor lumbosacral junction are included in the field of view to allow exact level determination). IMPRESSION: Two intraprocedural fluoroscopic images of the lumbar spine from reported L2 and L3 kyphoplasty, as described. Electronically Signed   By: Kellie Simmering DO   On: 03/12/2021 18:03   CT ABDOMEN PELVIS W CONTRAST  Result Date: 03/11/2021 CLINICAL DATA:  87 year old male with abdominal pain.  Recent diagnosis of MGUS, concern for multiple myeloma. EXAM: CT ABDOMEN AND PELVIS WITH CONTRAST TECHNIQUE: Multidetector CT imaging of the abdomen and pelvis was performed using the standard protocol following bolus administration of intravenous contrast. CONTRAST:  156m OMNIPAQUE IOHEXOL 300 MG/ML  SOLN COMPARISON:  Noncontrast CT Abdomen and Pelvis 01/29/2021 and earlier. Thoracic and lumbar MRI 01/29/2021. FINDINGS: Lower  chest: Stable small to moderate hiatal hernia. Tortuous thoracic aorta. Calcified coronary artery atherosclerosis. No cardiomegaly or pericardial effusion. No pleural effusion. Stable mild lung base atelectasis or scarring. Hepatobiliary: Gallbladder is at the upper limits of normal but does not appear inflamed. Negative liver. No bile duct enlargement. Pancreas: Negative. Spleen: Negative. Adrenals/Urinary Tract: Normal adrenal glands. Nonobstructed kidneys with symmetric renal enhancement and contrast excretion. There is probable small benign left renal parapelvic cyst accounting for the increased left renal hilum soft tissue on series 7, image 26, stable since 2018. Decompressed ureters. Decompressed and negative bladder. Stomach/Bowel: Negative large bowel aside from retained stool in the right colon. Diminutive, negative appendix on coronal image 48. Flocculated material in the terminal ileum but no dilated small bowel. Aside from hiatal hernia the stomach is negative. Negative duodenum. No free air or free fluid. Vascular/Lymphatic: Aortoiliac calcified atherosclerosis. Major arterial structures remain patent in the abdomen and pelvis. Tortuous iliac arteries. No lymphadenopathy. Reproductive: Prostatomegaly. Previous left inguinal hernia repair with mesh appears stable. Otherwise negative. Other: No pelvic free fluid. Musculoskeletal: Diffuse osteopenia and heterogeneous bone mineralization similar to the CT last month, distinctly different from a 2018 CT. Interval augmentation of the  T12 and L1 compression fractures. New/progressed compression fractures of L2 and L3 since last month. No significant retropulsion of bone or other complicating features. Bone mineralization is especially heterogeneous in the lumbar spine and pelvis, and these are likely pathologic fractures in the setting of MGUS/myeloma. No other acute osseous abnormality identified. IMPRESSION: 1. Diffusely abnormal bone mineralization in keeping with MGUS/Multiple Myeloma with new or progressed pathologic compression fractures of L2 and L3 since last month. Interval augmentation of T12 and L1. 2. No other acute finding in the abdomen or pelvis. Small to moderate hiatal hernia. Prostatomegaly. Aortic Atherosclerosis (ICD10-I70.0). Electronically Signed   By: Genevie Ann M.D.   On: 03/11/2021 09:53   NM PET Image Initial (PI) Whole Body  Result Date: 03/24/2021 CLINICAL DATA:  Initial treatment strategy for concern for multiple myeloma. Gammopathy with multiple M spikes. Prior kyphoplasty 12/16/2020 at L2-3. EXAM: NUCLEAR MEDICINE PET WHOLE BODY TECHNIQUE: 9.8 mCi F-18 FDG was injected intravenously. Full-ring PET imaging was performed from the head to foot after the radiotracer. CT data was obtained and used for attenuation correction and anatomic localization. Fasting blood glucose: 91 mg/dl COMPARISON:  03/11/2021 abdominopelvic CT.  Chest CT 11/21/2017. FINDINGS: Mediastinal blood pool activity: SUV max 3.1 HEAD/NECK: No areas of abnormal hypermetabolism. Incidental CT findings: Cerebral atrophy is within normal variation for age. No cervical adenopathy. Left carotid atherosclerotic calcifications. CHEST: No thoracic nodal hypermetabolism. Distal esophageal hypermetabolism in the setting of a small hiatal hernia. This measures a S.U.V. max of 4.6, including on approximately 139/3. No pulmonary parenchymal hypermetabolism. Incidental CT findings: Aortic and coronary artery calcification. Ascending aortic dilatation again  identified, including up to 4.6 cm. Mild cardiomegaly. Calcified left-sided pleural plaques. Centrilobular emphysema. No thoracic adenopathy. ABDOMEN/PELVIS: No abdominopelvic parenchymal or nodal hypermetabolism. Incidental CT findings: Normal adrenal glands. Proximal gastric underdistention. No abdominopelvic adenopathy. Moderate prostatomegaly. SKELETON: Multifocal thoracolumbar spine hypermetabolism. This is seen at the previously augmented levels and likely reactive. However, there is also hypermetabolism within the T11 vertebral body at a S.U.V. max of 5.1. Incidental CT findings: Diffuse heterogeneous osseous density, most consistent with myeloma involvement. EXTREMITIES: No areas of abnormal hypermetabolism. Incidental CT findings: Loose bodies about the right knee are likely within a popliteal cyst. Subtle cortical irregularity which is most apparent in the femoral, less so  humeral shafts, suspicious for myeloma involvement. IMPRESSION: 1. Heterogeneity throughout the marrow space, most consistent with the clinical history of multiple myeloma. The only suspicious area of osseous hypermetabolism is within the T11 vertebral body. 2. No findings of extra-osseous hypermetabolic myeloma. 3. Distal esophageal hypermetabolism, suspicious for esophagitis. Concurrent small hiatal hernia. 4. Incidental findings, including: Similar ascending aortic aneurysm. Aortic atherosclerosis (ICD10-I70.0), coronary artery atherosclerosis and emphysema (ICD10-J43.9). Left-sided pleural partially calcified pleural plaques may relate to prior empyema or hemothorax. Prostatomegaly. Electronically Signed   By: Abigail Miyamoto M.D.   On: 03/24/2021 16:05   DG C-Arm 1-60 Min  Result Date: 03/12/2021 CLINICAL DATA:  Provided history: Fracture. Additional history provided: L2-L3 kyphoplasty. Provided fluoroscopy time 2 minutes, 27 seconds. EXAM: LUMBAR SPINE - 2-3 VIEW; DG C-ARM 1-60 MIN COMPARISON:  CT abdomen/pelvis 03/11/2021.  FINDINGS: AP and lateral view intraprocedural fluoroscopic images of the lumbar spine are submitted, 2 images total. On the provided images, kyphoplasty material is present within three contiguous lumbar vertebral bodies (these are presumably the L1, L2 and L3 vertebral bodies given the provided history and findings on prior imaging. However, neither the thoracolumbar nor lumbosacral junction are included in the field of view to allow exact level determination). IMPRESSION: Two intraprocedural fluoroscopic images of the lumbar spine from reported L2 and L3 kyphoplasty, as described. Electronically Signed   By: Kellie Simmering DO   On: 03/12/2021 18:03    PERFORMANCE STATUS (ECOG) : 2 - Symptomatic, <50% confined to bed  Review of Systems Unless otherwise noted, a complete review of systems is negative.  Physical Exam General: NAD Cardiovascular: regular rate and rhythm Pulmonary: clear ant fields Abdomen: soft, nontender, + bowel sounds, R. Flank swelling without erythema, area is soft and no discrete mass is palpated GU: no suprapubic tenderness Extremities: no edema, no joint deformities Skin: no rashes Neurological: Weakness but otherwise nonfocal     Assessment and Plan- Patient is a 87 y.o. male multiple medical problems including recently diagnosed multiple myeloma who is not yet on treatment, hypertension, CKD stage IIIb, A. fib on Eliquis, and anemia, who presents to [facility] for evaluation of right flank swelling   Right flank swelling -patient had PET scan on 7/12 which did not show hypermetabolism or mass/swelling to the right flank.  Discussed with radiologist - Dr. Jobe Igo who reviewed the images.  Dr. Rogue Bussing saw the patient and feels that swelling likely reflects muscle laxity due to nerve involvement from the multiple myeloma.  He recommended hospitalization to begin treatment with Velcade tomorrow.  Patient is being sent to the ER for probable admission.  Neoplasm related pain  -patient is in significant pain in the clinic.  Patient given morphine 2 mg IV x2.  There is a documented allergy to morphine but both patient and wife stated that he tolerates it and he was receiving it during a recent hospitalization.  At home, pain has been managed with oxycodone IR and patient recently started OxyContin.  Constipation -likely from opioid pain medications.  Discussed adjustment to his bowel regimen  Case and plan discussed with Dr. Rogue Bussing  Patient expressed understanding and was in agreement with this plan. He also understands that He can call clinic at any time with any questions, concerns, or complaints.   Thank you for allowing me to participate in the care of this very pleasant patient.   Time Total: 30 minutes  Visit consisted of counseling and education dealing with the complex and emotionally intense issues of symptom management and  palliative care in the setting of serious and potentially life-threatening illness.Greater than 50%  of this time was spent counseling and coordinating care related to the above assessment and plan.  Signed by: Altha Harm, PhD, NP-C

## 2021-03-26 NOTE — ED Notes (Signed)
Patient provided urinal. Patient notified of urine sample ordered. Patient verbalized understanding.

## 2021-03-26 NOTE — ED Triage Notes (Signed)
[facility] in [facility]. Pt is here for admission to start treatment for multiple myeloma. Pt was give 2m of morphine in cancer ctr. Pt with #22 g to left hand.

## 2021-03-26 NOTE — ED Notes (Signed)
CT notified of patient completion of oral contrast.

## 2021-03-26 NOTE — Telephone Encounter (Signed)
Patient called to report that during his PT session today the Physical Therapist discover a large mass on his right flank. He states that he had not notice this before today.

## 2021-03-26 NOTE — Telephone Encounter (Signed)
Triage will assume that patient will be notified of decision to see him in [facility] or referred to PCP.

## 2021-03-26 NOTE — ED Notes (Signed)
Dr.Clapacs at bedside  

## 2021-03-26 NOTE — Telephone Encounter (Signed)
Message forwarded to [facility] team

## 2021-03-26 NOTE — ED Provider Notes (Signed)
[facility] Emergency Department Provider Note  ____________________________________________   Event Date/Time   First MD Initiated Contact with Patient 03/26/21 1611     (approximate)  I have reviewed the triage vital signs and the nursing notes.   HISTORY  Chief Complaint Abdominal Pain    HPI Jack [name] is a 88 y.o. male with a recent diagnosis of multiple myeloma who is not started on treatment.  He has had 2 episodes of compression fractures in his spine requiring kyphoplasty.  He also has had some rib fractures all due to the multiple myeloma.  He now comes in with a mass in the right side and pain in the right flank radiating towards the mass.  The mass itself is not tender.  He was sent here from the cancer center for control of his pain and to be started on treatment for his multiple myeloma.         Past Medical History:  Diagnosis Date   Elevated prostate specific antigen (PSA)    has been 7 for a year    GERD (gastroesophageal reflux disease)    History of colon polyps 2008   [facility] [place],    History of kidney stones    Hyperlipidemia    Hypertension    Prostate hypertrophy    diagnosed at age 62 due to hematospermia   Renal disorder     Patient Active Problem List   Diagnosis Date Noted   Multiple pathological fractures 03/14/2021   Kappa light chain myeloma (HCC)    Back pain 03/11/2021   Atrial fibrillation ([place]) 03/11/2021   Compression fracture of L2 and L3 03/11/2021   Hyperlipidemia    CKD (chronic kidney disease), stage IV (HCC)    Constipation    Palliative care encounter    Actinic keratosis 02/19/2021   Inguinal hernia 02/19/2021   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   Numbness of foot 02/19/2021   Sciatica 02/19/2021   Shoulder pain 02/19/2021   Palliative care patient 02/17/2021   Closed compression fracture of body of L1 vertebra ([place]) 02/07/2021   Protein-calorie  malnutrition, severe 01/31/2021   Hypokalemia    Normocytic anemia    Atherosclerosis of aorta (HCC)    Irregular heart rhythm    T12 compression fracture ([place]) 01/02/2021   History of kidney stones    Acute kidney injury superimposed on CKD ([place])    Ureteral stone    Ureteral perforation secondary to stent manipulation 10/16/2017   Abdominal pain 10/16/2017   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/26/2016   Hyperplasia, prostate 02/03/2015   Multinodular goiter 07/03/2014   Thyroid nodule 06/20/2014   Nontoxic uninodular goiter 06/20/2014   Edema 09/24/2013   Encounter for preventive health examination 08/21/2013   Personal history of colonic polyps 08/21/2013   History of venomous snake bite 08/21/2013   Cough 10/02/2012   Prostate hypertrophy    Elevated prostate specific antigen (PSA)    HYPERTRIGLYCERIDEMIA 12/21/2006   Essential hypertension 12/21/2006   GERD 12/21/2006   CALCULUS, KIDNEY 12/21/2006    Past Surgical History:  Procedure Laterality Date   CATARACT EXTRACTION W/PHACO Left 01/10/2018   Procedure: CATARACT EXTRACTION PHACO AND INTRAOCULAR LENS PLACEMENT ([place]);  Surgeon: Birder Robson, MD;  Location: [facility] ORS;  Service: Ophthalmology;  Laterality: Left;  [name] 00:24.8 AP% 14.9 CDE 3.68 Fluid pack lot # [id number]   CATARACT EXTRACTION W/PHACO Right 01/25/2018   Procedure: CATARACT EXTRACTION PHACO AND INTRAOCULAR LENS PLACEMENT ([facility]);  Surgeon: Birder Robson,  MD;  Location: [facility] ORS;  Service: Ophthalmology;  Laterality: Right;  [name] 00:42 AP% 10.8 CDE 4.59 Fluid pack lot # [id number]   COLON SURGERY     CYSTOSCOPY W/ URETERAL STENT PLACEMENT Right 10/16/2017   Procedure: right  URETERAL STENT PLACEMENT,cystoscopy bilateral stent removal,rretrograde;  Surgeon: Abbie Sons, MD;  Location: [facility] ORS;  Service: Urology;  Laterality: Right;   CYSTOSCOPY/URETEROSCOPY/HOLMIUM LASER/STENT PLACEMENT Right 12/16/2020   Procedure: CYSTOSCOPY/URETEROSCOPY/HOLMIUM LASER/STENT  PLACEMENT;  Surgeon: Abbie Sons, MD;  Location: [facility] ORS;  Service: Urology;  Laterality: Right;   EXTRACORPOREAL SHOCK WAVE LITHOTRIPSY Right 12/11/2020   Procedure: EXTRACORPOREAL SHOCK WAVE LITHOTRIPSY (ESWL);  Surgeon: Abbie Sons, MD;  Location: [facility] ORS;  Service: Urology;  Laterality: Right;   IR KYPHO EA ADDL LEVEL THORACIC OR LUMBAR  02/02/2021   IR KYPHO LUMBAR INC FX REDUCE BONE BX UNI/BIL CANNULATION INC/IMAGING  02/02/2021   KIDNEY STONE SURGERY     KYPHOPLASTY N/A 03/12/2021   Procedure: Hewitt Shorts, L3;  Surgeon: Hessie Knows, MD;  Location: [facility] ORS;  Service: Orthopedics;  Laterality: N/A;   RESECTION SOFT TISSUE TUMOR LEG / ANKLE RADICAL  jan 2009   [facility],  right thigh/knee , nonmalignant   SMALL INTESTINE SURGERY  1946   implaed on picket fence, punctured stomach   TONSILLECTOMY      Prior to Admission medications   Medication Sig Start Date End Date Taking? Authorizing Provider  acetaminophen (TYLENOL) 500 MG tablet Take 2 tablets (1,000 mg total) by mouth every 8 (eight) hours. 03/17/21   Ezekiel Slocumb, DO  apixaban (ELIQUIS) 2.5 MG TABS tablet Take 1 tablet (2.5 mg total) by mouth 2 (two) times daily. 02/07/21   [place] Donnamarie Poag, DO  atorvastatin (LIPITOR) 40 MG tablet Take 1 tablet (40 mg total) by mouth daily at 6 PM. 02/24/21 02/24/22  Marrianne Mood D, PA-C  baclofen (LIORESAL) 10 MG tablet Take 10 mg by mouth 3 (three) times daily.    [provider]  bisacodyl (DULCOLAX) 5 MG EC tablet Take 1 tablet (5 mg total) by mouth daily as needed for moderate constipation. 03/17/21   Nicole Kindred A, DO  calcitonin, salmon, (MIACALCIN/FORTICAL) 200 UNIT/ACT nasal spray Place 1 spray into alternate nostrils daily. 02/07/21   [place] Donnamarie Poag, DO  carvedilol (COREG) 25 MG tablet Take 1 tablet (25 mg total) by mouth 2 (two) times daily with a meal. 03/17/21   Nicole Kindred A, DO  dexamethasone (DECADRON) 4 MG tablet Take 1 tablet (4 mg total) by mouth every  12 (twelve) hours. 03/17/21   Ezekiel Slocumb, DO  docusate sodium (COLACE) 100 MG capsule Take 1 capsule (100 mg total) by mouth 2 (two) times daily. 01/07/21   Loletha Grayer, MD  gabapentin (NEURONTIN) 100 MG capsule Take 100 mg by mouth 3 (three) times daily.    [provider]  lidocaine (XYLOCAINE) 5 % ointment Apply topically 3 (three) times daily as needed for mild pain or moderate pain. 03/17/21   Ezekiel Slocumb, DO  naloxone (NARCAN) nasal spray 4 mg/0.1 mL SPRAY 1 SPRAY INTO ONE NOSTRIL AS DIRECTED FOR OPIOID OVERDOSE (TURN PERSON ON SIDE AFTER DOSE. IF NO RESPONSE IN 2-3 MINUTES OR PERSON RESPONDS BUT RELAPSES, REPEAT USING A NEW SPRAY DEVICE AND SPRAY INTO THE OTHER NOSTRIL. CALL 911 AFTER USE.) * EMERGENCY USE ONLY * 03/25/21   Borders, Kirt Boys, NP  ondansetron (ZOFRAN) 8 MG tablet Take 1 tablet (8 mg total) by mouth every 8 (  eight) hours as needed for nausea or vomiting. 03/25/21   Borders, Kirt Boys, NP  oxyCODONE (OXYCONTIN) 10 mg 12 hr tablet Take 1 tablet (10 mg total) by mouth every 12 (twelve) hours. 03/25/21   Borders, Kirt Boys, NP  oxyCODONE-acetaminophen (PERCOCET/ROXICET) 5-325 MG tablet Take 1-2 tablets by mouth every 4 (four) hours as needed for severe pain. 03/25/21   Borders, Kirt Boys, NP  senna (SENOKOT) 8.6 MG TABS tablet Take 2 tablets by mouth at bedtime.    [provider]  senna-docusate (SENOKOT-S) 8.6-50 MG tablet Take 1 tablet by mouth 2 (two) times daily. 03/17/21   Nicole Kindred A, DO    Allergies Quinolones, Amlodipine, Azithromycin, Lisinopril, and Morphine and related  Family History  Problem Relation Age of Onset   Hypertension Father    Hyperlipidemia Father    Cancer Sister        thyroid - dx in late 20's    Social History Social History   Tobacco Use   Smoking status: Former    Types: Cigarettes    Quit date: 07/05/1965    Years since quitting: 55.7   Smokeless tobacco: Never  Vaping Use   Vaping Use: Never used   Substance Use Topics   Alcohol use: Yes    Alcohol/week: 1.0 standard drink    Types: 1 Standard drinks or equivalent per week    Comment: occassionaly   Drug use: No    Review of Systems  Constitutional: No fever/chills Eyes: No visual changes. ENT: No sore throat. Cardiovascular: Denies chest pain. Respiratory: Denies shortness of breath. Gastrointestinal: No abdominal pain.  No nausea, no vomiting.  No diarrhea.  No constipation. Genitourinary: Negative for dysuria. Musculoskeletal: Right flank/back pain. Skin: Negative for rash. Neurological: Negative for headaches, focal weakness  ____________________________________________   PHYSICAL EXAM:  VITAL SIGNS: ED Triage Vitals  Enc Vitals Group     BP 03/26/21 1534 (!) 176/110     Pulse Rate 03/26/21 1534 76     Resp 03/26/21 1534 18     Temp 03/26/21 1534 (!) 97.5 F (36.4 C)     Temp Source 03/26/21 1534 Oral     SpO2 03/26/21 1533 98 %     Weight 03/26/21 1536 180 lb (81.6 kg)     Height 03/26/21 1536 '5\' 9"'  (1.753 m)     Head Circumference --      Peak Flow --      Pain Score 03/26/21 1535 10     Pain Loc --      Pain Edu? --      Excl. in [place]? --     Constitutional: Alert and oriented.  In some pain. Eyes: Conjunctivae are normal. PER Head: Atraumatic. Nose: No congestion/rhinnorhea. Mouth/Throat: Mucous membranes are moist.  Oropharynx non-erythematous. Neck: No stridor.  Cardiovascular: Normal rate, regular rhythm. Grossly normal heart sounds.  Good peripheral circulation. Respiratory: Normal respiratory effort.  No retractions. Lungs CTAB. Gastrointestinal: Soft and nontender. No distention.  There is a mass in the right side under the ribs no abdominal bruits.  There is a little bit of pain in the right flank on palpation patient reports pain is mostly deeper in by Musculoskeletal: No lower extremity tenderness nor edema.   Neurologic:  Normal speech and language. No gross focal neurologic deficits are  appreciated. No gait instability. Skin:  Skin is warm, dry and intact. No rash noted.   ____________________________________________   LABS (all labs ordered are listed, but only abnormal results are  displayed)  Labs Reviewed  COMPREHENSIVE METABOLIC PANEL - Abnormal; Notable for the following components:      Result Value   BUN 29 (*)    Total Protein 6.0 (*)    Total Bilirubin 1.4 (*)    All other components within normal limits  CBC WITH DIFFERENTIAL/PLATELET - Abnormal; Notable for the following components:   RBC 3.84 (*)    Hemoglobin 12.2 (*)    HCT 38.5 (*)    MCV 100.3 (*)    RDW 16.6 (*)    All other components within normal limits  LACTIC ACID, PLASMA - Abnormal; Notable for the following components:   Lactic Acid, Venous 4.0 (*)    All other components within normal limits  RESP PANEL BY RT-PCR (FLU A&B, COVID) ARPGX2  RESPIRATORY PANEL BY PCR  LIPASE, BLOOD  URINALYSIS, COMPLETE (UACMP) WITH MICROSCOPIC   ____________________________________________  EKG  ______________________________________  RADIOLOGY Gertha Calkin, personally viewed and evaluated these images (plain radiographs) as part of my medical decision making, as well as reviewing the written report by the radiologist.  ED MD interpretation:    Official radiology report(s): CT ABDOMEN PELVIS W CONTRAST  Result Date: 03/26/2021 CLINICAL DATA:  Abdominal distension and abdominal pain EXAM: CT ABDOMEN AND PELVIS WITH CONTRAST TECHNIQUE: Multidetector CT imaging of the abdomen and pelvis was performed using the standard protocol following bolus administration of intravenous contrast. CONTRAST:  163m OMNIPAQUE IOHEXOL 350 MG/ML SOLN COMPARISON:  03/11/2021 FINDINGS: Lower chest: Unremarkable. Hepatobiliary: No suspicious focal abnormality within the liver parenchyma. There is no evidence for gallstones, gallbladder wall thickening, or pericholecystic fluid. No intrahepatic or extrahepatic biliary  dilation. Pancreas: No focal mass lesion. No dilatation of the main duct. No intraparenchymal cyst. No peripancreatic edema. Spleen: No splenomegaly. No focal mass lesion. Adrenals/Urinary Tract: No adrenal nodule or mass. Kidneys unremarkable. No evidence for hydroureter. The urinary bladder appears normal for the degree of distention. Stomach/Bowel: Tiny hiatal hernia. Stomach otherwise unremarkable. Duodenum is normally positioned as is the ligament of Treitz. No small bowel wall thickening. No small bowel dilatation. The terminal ileum is normal. The appendix is normal. No gross colonic mass. No colonic wall thickening. No substantial diverticular disease in the left colon. Colon is diffusely fluid-filled with fluid visible in the rectum, imaging findings that could be consistent diarrhea. No colonic wall thickening. Vascular/Lymphatic: There is moderate atherosclerotic calcification of the abdominal aorta without aneurysm. There is no gastrohepatic or hepatoduodenal ligament lymphadenopathy. No retroperitoneal or mesenteric lymphadenopathy. No pelvic sidewall lymphadenopathy. Reproductive: Prostate gland is markedly enlarged. Other: No intraperitoneal free fluid. Musculoskeletal: Mesh in the left groin region is compatible with prior herniorrhaphy. Bony mineralization is diffusely heterogeneous in this patient with a history of multiple myeloma. Patient is status post multiple level vertebral augmentation in the lower thoracic and lumbar spine. IMPRESSION: 1. No acute findings in the abdomen or pelvis. 2. Fluid in the right and transverse colon with visible fluid in the rectum. Imaging findings could be consistent with diarrhea. No evidence for colonic wall thickening to suggest colitis. 3. Marked prostatomegaly. 4. Tiny hiatal hernia. 5. Aortic Atherosclerosis (ICD10-I70.0). Electronically Signed   By: EMisty StanleyM.D.   On: 03/26/2021 18:09     ____________________________________________   PROCEDURES  Procedure(s) performed (including Critical Care):  Procedures   ____________________________________________   INITIAL IMPRESSION / ASSESSMENT AND PLAN / ED COURSE  Notes labs and x-rays recently were reviewed. CT read by radiology shows no acute disease.  I do  not see thing on the chest x-ray.  He still having a lot of pain we will get him in the hospital.          ____________________________________________   FINAL CLINICAL IMPRESSION(S) / ED DIAGNOSES  Final diagnoses:  Pain of upper abdomen  Multiple myeloma not having achieved remission [facility])     ED Discharge Orders     None        Note:  This document was prepared using Dragon voice recognition software and may include unintentional dictation errors.    Nena Polio, MD 03/26/21 [phone]

## 2021-03-26 NOTE — Progress Notes (Signed)
START ON PATHWAY REGIMEN - Multiple Myeloma and Other Plasma Cell Dyscrasias     A cycle is every 21 days:     Bortezomib      Lenalidomide      Dexamethasone   **Always confirm dose/schedule in your pharmacy ordering system**  Patient Characteristics: Multiple Myeloma, Newly Diagnosed, Transplant Ineligible or Refused, High Risk Disease Classification: Multiple Myeloma R-ISS Staging: III Therapeutic Status: Newly Diagnosed Is Patient Eligible for Transplant<= Transplant Ineligible or Refused Risk Status: High Risk Intent of Therapy: Non-Curative / Palliative Intent, Discussed with Patient

## 2021-03-26 NOTE — ED Notes (Signed)
Verbal order received from [facility] for Dilaudid. See MAR.

## 2021-03-26 NOTE — ED Notes (Signed)
Called spouse; Gwenlyn Fudge for update, admission, and inpatient room.

## 2021-03-26 NOTE — ED Notes (Signed)
Patient transported to CT 

## 2021-03-26 NOTE — ED Notes (Signed)
Transport requested for patient.  

## 2021-03-26 NOTE — ED Notes (Signed)
Patient reports tolerating morphine with premedication of nausea medicine. Morphine given per MAR.

## 2021-03-26 NOTE — ED Notes (Signed)
Patient noted to become hypoxia per remote monitor. This RN to bedside. Patient sat noted to be around 40% with a good wave form, and respiratory rate of 8-10. This RN roused patient, who is alert, oriented x4, and denies SOB. Patient sat improved to 88% when roused, but oxygen placed on patient at this time. Dr. Cinda Quest notified.

## 2021-03-26 NOTE — ED Notes (Signed)
Dr. Cinda Quest notified of patient complaints of pain by Hoyle Sauer, RN.

## 2021-03-26 NOTE — H&P (Signed)
History and Physical   Jack [name] DDU:[id number] DOB: 12-01-1932 DOA: 03/26/2021  PCP: [name], MD  Outpatient Specialists: Dr. Rogue Bussing, medical oncology Patient coming from: [place]  I have personally briefly reviewed patient's old medical records in [place].  Chief Concern: flank pain  HPI: Jack [name] is a 87 y.o. male with medical history significant for paroxysmal atrial fibrillation, PACs, coronary calcium and aortic atherosclerosis, diastolic dysfunction, C3JS, GERD, aortic root dilatation, ascending aortic dilatation, hypertension, GERD, hypertriglyceridemia, CKD stage IIIb, BPH, multiple myeloma, prior tobacco use, presents to the emergency department from oncology clinic for persistent left flank pain.  Patient reports that the low back pain started at approximately 730 to 8 AM on day of presentation.  He states that the pain is 10/10 with movement, 2/10 at rest, constant pain and dull pain. He denies trauma to his person, recent falls.   He denies chest pain, shortness of breath, abdominal, dysuria, changes to diet. He states he has never felt this way before   He endorses constipation. He endorses recent lost of 30 pounds prior to Multiple myeloma.  He drank 1.5 bottles of magnesium citrate and was persistently on the toilet and got all watery stool.  Social history: he lives at home with his wife. He denies tobacco, etoh, recreational drug use. He is retired, and formerly worked in Personal assistant.  Vaccination history: He is vaccinated for covid 19, 3 doses, Moderna   ROS: Constitutional: + weight change, no fever ENT/Mouth: no sore throat, no rhinorrhea Eyes: no eye pain, no vision changes Cardiovascular: no chest pain, no dyspnea,  no edema, no palpitations Respiratory: no cough, no sputum, no wheezing Gastrointestinal: no nausea, no vomiting, no diarrhea, + constipation Genitourinary: no urinary incontinence, no dysuria, no  hematuria Musculoskeletal: no arthralgias, + myalgias Skin: no skin lesions, no pruritus, Neuro: + weakness, no loss of consciousness, no syncope Psych: no anxiety, no depression, + decreased appetite Heme/Lymph: no bruising, no bleeding  ED Course: Discussed with EDP, patient requiring hospitalization for chief concerns of hypoxia after multiple sedating medication.  Vitals in the emergency department was remarkable for temperature of 97.9, heart of 72, respiration rate of 20, blood pressure 145/99, SPO2 of 100% on room air initially and then decreased to 83% on room air.  Labs in the emergency department was remarkable for sodium 135, potassium 4.2, BUN 29, serum creatinine of 1.15, nonfasting blood glucose 98, EGFR 60, WBC 6.9, hemoglobin 12.2, platelets 247, COVID/influenza A/influenza B PCR were negative.  Patient was recently diagnosed with multiple myeloma.  In the emergency department patient was given 8 mg total of IV morphine, 0.5 mg of Dilaudid.  At the cancer center patient was given total of 4 mg of IV morphine for pain control.  Assessment/Plan  Principal Problem:   Acute hypoxemic respiratory failure (HCC) Active Problems:   Essential hypertension   GERD   Prostate hypertrophy   Hyperlipidemia   Constipation   Right flank pain   # Acute hypoxic respiratory failure secondary to multiple pain medication including total of 12 mg of morphine and 0.5 mg of Dilaudid - CT abdomen and pelvis was read as no acute findings in the abdomen or pelvis.  Fluid in the right and transverse colon with visible fluid in the rectum.  Findings consistent with diarrhea.  No evidence of colonic wall thickening to suggest colitis.  Marked prostatomegaly.  Tiny hiatal hernia. - Avoid opioid medication at this time - Lidocaine patch to the  right flank ordered - Toradol 15 mg IV every 6 hours as needed for severe pain, for 1 day - Incentive spirometry, flutter valve ordered  # Multiple  myeloma-oncology team had tentative plans to initiate Multiple myeloma treatments while inpatient on 03/27/2021 per secure chat  # Chronic pain-patient on multiple sedating pain and respiratory depressing including pain gabapentin 100 mg 3 times daily, oxycodone 10 mg every 12 hours, Percocet 5, 1 to 2 tablets every 4 hours, tramadol -The above has been reviewed held except for gabapentin which has been resumed   # Chronic constipation-resumed senna docusate twice daily, Dulcolax ordered daily starting 03/27/2021  Chart reviewed.   DVT prophylaxis: Eliquis 2.5 mg twice daily Code Status: Full code Diet: Heart healthy Family Communication: No Disposition Plan: Pending clinical course Consults called: None at this time Admission status: MedSurg, observation, telemetry  Past Medical History:  Diagnosis Date   Elevated prostate specific antigen (PSA)    has been 7 for a year    GERD (gastroesophageal reflux disease)    History of colon polyps 2008   [facility],    History of kidney stones    Hyperlipidemia    Hypertension    Prostate hypertrophy    diagnosed at age 28 due to hematospermia   Renal disorder    Past Surgical History:  Procedure Laterality Date   CATARACT EXTRACTION W/PHACO Left 01/10/2018   Procedure: CATARACT EXTRACTION PHACO AND INTRAOCULAR LENS PLACEMENT ([place]);  Surgeon: Birder Robson, MD;  Location: [facility] ORS;  Service: Ophthalmology;  Laterality: Left;  [name] 00:24.8 AP% 14.9 CDE 3.68 Fluid pack lot # [id number]   CATARACT EXTRACTION W/PHACO Right 01/25/2018   Procedure: CATARACT EXTRACTION PHACO AND INTRAOCULAR LENS PLACEMENT (I[facility]);  Surgeon: Birder Robson, MD;  Location: [facility] OR[facility];  Service: Ophthalmology;  Laterality: Right;  [name] 00:42 AP% 10.8 CDE 4.59 Fluid pack lot # [id number]   COLON SURGERY     CYSTOSCOPY W/ URETERAL STENT PLACEMENT Right 10/16/2017   Procedure: right  URETERAL STENT PLACEMENT,cystoscopy bilateral stent removal,rretrograde;  Surgeon:  Abbie Sons, MD;  Location: [facility] OR[facility];  Service: Urology;  Laterality: Right;   CYSTOSCOPY/URETEROSCOPY/HOLMIUM LASER/STENT PLACEMENT Right 12/16/2020   Procedure: CYSTOSCOPY/URETEROSCOPY/HOLMIUM LASER/STENT PLACEMENT;  Surgeon: Abbie Sons, MD;  Location: [facility] OR[facility];  Service: Urology;  Laterality: Right;   EXTRACORPOREAL SHOCK WAVE LITHOTRIPSY Right 12/11/2020   Procedure: EXTRACORPOREAL SHOCK WAVE LITHOTRIPSY (ESWL);  Surgeon: Abbie Sons, MD;  Location: [facility] OR[facility];  Service: Urology;  Laterality: Right;   IR KYPHO EA ADDL LEVEL THORACIC OR LUMBAR  02/02/2021   IR KYPHO LUMBAR INC FX REDUCE BONE BX UNI/BIL CANNULATION INC/IMAGING  02/02/2021   KIDNEY STONE SURGERY     KYPHOPLASTY N/A 03/12/2021   Procedure: Hewitt Shorts, L3;  Surgeon: Hessie Knows, MD;  Location: [facility] OR[facility];  Service: Orthopedics;  Laterality: N/A;   RESECTION SOFT TISSUE TUMOR LEG / ANKLE RADICAL  jan 2009   [facility],  right thigh/knee , nonmalignant   SMALL INTESTINE SURGERY  1946   implaed on picket fence, punctured stomach   TONSILLECTOMY     Social History:  reports that he quit smoking about 55 years ago. He has never used smokeless tobacco. He reports current alcohol use of about 1.0 standard drink of alcohol per week. He reports that he does not use drugs.  Allergies  Allergen Reactions   Quinolones     Aortic dilation contraindicates FQ   Amlodipine Other (See Comments)    LE edema   Azithromycin Nausea  And Vomiting   Lisinopril Cough   Morphine And Related Nausea And Vomiting   Family History  Problem Relation Age of Onset   Hypertension Father    Hyperlipidemia Father    Cancer Sister        thyroid - dx in late 20's   Family history: Family history reviewed and not pertinent  Prior to Admission medications   Medication Sig Start Date End Date Taking? Authorizing Provider  apixaban (ELIQUIS) 2.5 MG TABS tablet Take 1 tablet (2.5 mg total) by mouth 2 (two) times daily. 02/07/21  Yes [place] Donnamarie Poag, DO  calcitonin, salmon, (MIACALCIN/FORTICAL) 200 UNIT/ACT nasal spray Place 1 spray into alternate nostrils daily. 02/07/21  Yes [place] Eric J, DO  dexamethasone (DECADRON) 4 MG tablet Take 1 tablet (4 mg total) by mouth every 12 (twelve) hours. 03/17/21  Yes Nicole Kindred A, DO  lidocaine (XYLOCAINE) 5 % ointment Apply topically 3 (three) times daily as needed for mild pain or moderate pain. 03/17/21  Yes Nicole Kindred A, DO  naloxone (NARCAN) nasal spray 4 mg/0.1 mL SPRAY 1 SPRAY INTO ONE NOSTRIL AS DIRECTED FOR OPIOID OVERDOSE (TURN PERSON ON SIDE AFTER DOSE. IF NO RESPONSE IN 2-3 MINUTES OR PERSON RESPONDS BUT RELAPSES, REPEAT USING A NEW SPRAY DEVICE AND SPRAY INTO THE OTHER NOSTRIL. CALL 911 AFTER USE.) * EMERGENCY USE ONLY * 03/25/21  Yes Borders, Kirt Boys, NP  ondansetron (ZOFRAN) 8 MG tablet Take 1 tablet (8 mg total) by mouth every 8 (eight) hours as needed for nausea or vomiting. 03/25/21  Yes Borders, Kirt Boys, NP  oxyCODONE (OXYCONTIN) 10 mg 12 hr tablet Take 1 tablet (10 mg total) by mouth every 12 (twelve) hours. 03/25/21  Yes Borders, Kirt Boys, NP  oxyCODONE-acetaminophen (PERCOCET/ROXICET) 5-325 MG tablet Take 1-2 tablets by mouth every 4 (four) hours as needed for severe pain. 03/25/21  Yes Borders, Kirt Boys, NP  acetaminophen (TYLENOL) 500 MG tablet Take 2 tablets (1,000 mg total) by mouth every 8 (eight) hours. Patient not taking: Reported on 03/26/2021 03/17/21   Nicole Kindred A, DO  atorvastatin (LIPITOR) 40 MG tablet Take 1 tablet (40 mg total) by mouth daily at 6 PM. 02/24/21 02/24/22  Marrianne Mood D, PA-C  baclofen (LIORESAL) 10 MG tablet Take 10 mg by mouth 3 (three) times daily.    [provider]  bisacodyl (DULCOLAX) 5 MG EC tablet Take 1 tablet (5 mg total) by mouth daily as needed for moderate constipation. 03/17/21   Ezekiel Slocumb, DO  carvedilol (COREG) 25 MG tablet Take 1 tablet (25 mg total) by mouth 2 (two) times daily with a meal. 03/17/21   Nicole Kindred A, DO  docusate sodium (COLACE) 100 MG capsule Take 1 capsule (100 mg total) by mouth 2 (two) times daily. 01/07/21   Loletha Grayer, MD  gabapentin (NEURONTIN) 100 MG capsule Take 100 mg by mouth 3 (three) times daily.    [provider]  senna (SENOKOT) 8.6 MG TABS tablet Take 2 tablets by mouth at bedtime.    [provider]  senna-docusate (SENOKOT-S) 8.6-50 MG tablet Take 1 tablet by mouth 2 (two) times daily. 03/17/21   Ezekiel Slocumb, DO   Physical Exam: Vitals:   03/26/21 1930 03/26/21 2000 03/26/21 2145 03/26/21 2207  BP: (!) 144/91 (!) 159/96 (!) 147/97 (!) 191/111  Pulse: 68 69 69 69  Resp: 13 (!) 30 (!) 29 20  Temp:    98 F (36.7 C)  TempSrc:  Oral  SpO2: 100% 99% 100% 100%  Weight:      Height:       Constitutional: appears age appropriate, NAD, calm, comfortable Eyes: PERRL, lids and conjunctivae normal ENMT: Mucous membranes are moist. Posterior pharynx clear of any exudate or lesions. Age-appropriate dentition. Hearing appropriate Neck: normal, supple, no masses, no thyromegaly Respiratory: clear to auscultation bilaterally, no wheezing, no crackles. Normal respiratory effort. No accessory muscle use.  Cardiovascular: Regular rate and rhythm, no murmurs / rubs / gallops. No extremity edema. 2+ pedal pulses. No carotid bruits.  Abdomen: no tenderness, no masses palpated, no hepatosplenomegaly. Bowel sounds positive.  Musculoskeletal: no clubbing / cyanosis. No joint deformity upper and lower extremities. Good ROM, no contractures, no atrophy. Normal muscle tone.  Skin: no rashes, lesions, ulcers. No induration Neurologic: Sensation intact. Strength 5/5 in all 4.  Psychiatric: Normal judgment and insight. Alert and oriented x 3. Normal mood.   EKG: independently reviewed, showing sinus rhythm with rate of 65, QTc 459  Chest x-ray on Admission: I personally reviewed and I agree with radiologist reading as below.  CT ABDOMEN PELVIS W  CONTRAST  Result Date: 03/26/2021 CLINICAL DATA:  Abdominal distension and abdominal pain EXAM: CT ABDOMEN AND PELVIS WITH CONTRAST TECHNIQUE: Multidetector CT imaging of the abdomen and pelvis was performed using the standard protocol following bolus administration of intravenous contrast. CONTRAST:  159m OMNIPAQUE IOHEXOL 350 MG/ML SOLN COMPARISON:  03/11/2021 FINDINGS: Lower chest: Unremarkable. Hepatobiliary: No suspicious focal abnormality within the liver parenchyma. There is no evidence for gallstones, gallbladder wall thickening, or pericholecystic fluid. No intrahepatic or extrahepatic biliary dilation. Pancreas: No focal mass lesion. No dilatation of the main duct. No intraparenchymal cyst. No peripancreatic edema. Spleen: No splenomegaly. No focal mass lesion. Adrenals/Urinary Tract: No adrenal nodule or mass. Kidneys unremarkable. No evidence for hydroureter. The urinary bladder appears normal for the degree of distention. Stomach/Bowel: Tiny hiatal hernia. Stomach otherwise unremarkable. Duodenum is normally positioned as is the ligament of Treitz. No small bowel wall thickening. No small bowel dilatation. The terminal ileum is normal. The appendix is normal. No gross colonic mass. No colonic wall thickening. No substantial diverticular disease in the left colon. Colon is diffusely fluid-filled with fluid visible in the rectum, imaging findings that could be consistent diarrhea. No colonic wall thickening. Vascular/Lymphatic: There is moderate atherosclerotic calcification of the abdominal aorta without aneurysm. There is no gastrohepatic or hepatoduodenal ligament lymphadenopathy. No retroperitoneal or mesenteric lymphadenopathy. No pelvic sidewall lymphadenopathy. Reproductive: Prostate gland is markedly enlarged. Other: No intraperitoneal free fluid. Musculoskeletal: Mesh in the left groin region is compatible with prior herniorrhaphy. Bony mineralization is diffusely heterogeneous in this patient  with a history of multiple myeloma. Patient is status post multiple level vertebral augmentation in the lower thoracic and lumbar spine. IMPRESSION: 1. No acute findings in the abdomen or pelvis. 2. Fluid in the right and transverse colon with visible fluid in the rectum. Imaging findings could be consistent with diarrhea. No evidence for colonic wall thickening to suggest colitis. 3. Marked prostatomegaly. 4. Tiny hiatal hernia. 5. Aortic Atherosclerosis (ICD10-I70.0). Electronically Signed   By: EMisty StanleyM.D.   On: 03/26/2021 18:09   DG Chest Portable 1 View  Result Date: 03/26/2021 CLINICAL DATA:  87year old male with right flank pain and rib fracture. EXAM: PORTABLE CHEST 1 VIEW COMPARISON:  Chest radiograph dated 02/02/2021. FINDINGS: No focal consolidation, pleural effusion, or pneumothorax. Apparent two small adjacent calcified nodules in the left upper lobe with combined dimension of  9 mm. Attention on follow-up imaging recommended. The cardiac silhouette is within limits. No acute osseous pathology. IMPRESSION: 1. No acute cardiopulmonary process. 2. A 9 mm left upper lobe calcified nodule. Electronically Signed   By: Anner Crete M.D.   On: 03/26/2021 19:08    Labs on Admission: I have personally reviewed following labs  CBC: Recent Labs  Lab 03/26/21 1549  WBC 6.9  NEUTROABS 5.2  HGB 12.2*  HCT 38.5*  MCV 100.3*  PLT 414   Basic Metabolic Panel: Recent Labs  Lab 03/26/21 1549  NA 135  K 4.2  CL 100  CO2 22  GLUCOSE 98  BUN 29*  CREATININE 1.15  CALCIUM 9.2   GFR: Estimated Creatinine Clearance: 47 mL/min (by C-G formula based on SCr of 1.15 mg/dL).  Liver Function Tests: Recent Labs  Lab 03/26/21 1549  AST 41  ALT 25  ALKPHOS 104  BILITOT 1.4*  PROT 6.0*  ALBUMIN 3.8   Recent Labs  Lab 03/26/21 1549  LIPASE 32    Recent Labs  Lab 03/24/21 1316  GLUCAP 91   Urine analysis:    Component Value Date/Time   COLORURINE STRAW (A) 03/26/2021  1624   APPEARANCEUR HAZY (A) 03/26/2021 1624   APPEARANCEUR Clear 12/25/2020 1037   LABSPEC 1.025 03/26/2021 1624   PHURINE 9.0 (H) 03/26/2021 1624   GLUCOSEU NEGATIVE 03/26/2021 1624   HGBUR NEGATIVE 03/26/2021 1624   BILIRUBINUR NEGATIVE 03/26/2021 1624   BILIRUBINUR Negative 12/25/2020 1037   KETONESUR NEGATIVE 03/26/2021 1624   PROTEINUR NEGATIVE 03/26/2021 1624   NITRITE NEGATIVE 03/26/2021 1624   LEUKOCYTESUR NEGATIVE 03/26/2021 1624   Dr. Tobie Poet [facility]  If 7PM-7AM, please contact overnight-coverage provider If 7AM-7PM, please contact day coverage provider [email or web address]  03/26/2021, 10:33 PM

## 2021-03-26 NOTE — Progress Notes (Signed)
Pt transported to ED via wheelchair. Report given to triage RN.

## 2021-03-26 NOTE — Telephone Encounter (Signed)
Called patient and scheduled [facility] appointment for this afternoon at 1PM.

## 2021-03-26 NOTE — Progress Notes (Signed)
Presents to [facility] for large mass to right side of abdomen, extending around right flank. Reports back pain, but denies pain to mass. This was discovered today during a PT session.

## 2021-03-27 ENCOUNTER — Other Ambulatory Visit: Payer: Self-pay | Admitting: Internal Medicine

## 2021-03-27 ENCOUNTER — Inpatient Hospital Stay: Payer: PPO

## 2021-03-27 VITALS — BP 145/91 | HR 82 | Resp 22

## 2021-03-27 DIAGNOSIS — Z808 Family history of malignant neoplasm of other organs or systems: Secondary | ICD-10-CM | POA: Diagnosis not present

## 2021-03-27 DIAGNOSIS — T426X5A Adverse effect of other antiepileptic and sedative-hypnotic drugs, initial encounter: Secondary | ICD-10-CM | POA: Diagnosis present

## 2021-03-27 DIAGNOSIS — E43 Unspecified severe protein-calorie malnutrition: Secondary | ICD-10-CM | POA: Diagnosis present

## 2021-03-27 DIAGNOSIS — T402X5A Adverse effect of other opioids, initial encounter: Secondary | ICD-10-CM | POA: Diagnosis present

## 2021-03-27 DIAGNOSIS — E785 Hyperlipidemia, unspecified: Secondary | ICD-10-CM | POA: Diagnosis present

## 2021-03-27 DIAGNOSIS — J9601 Acute respiratory failure with hypoxia: Secondary | ICD-10-CM | POA: Diagnosis present

## 2021-03-27 DIAGNOSIS — Z79899 Other long term (current) drug therapy: Secondary | ICD-10-CM | POA: Diagnosis not present

## 2021-03-27 DIAGNOSIS — C9000 Multiple myeloma not having achieved remission: Secondary | ICD-10-CM | POA: Diagnosis present

## 2021-03-27 DIAGNOSIS — K5909 Other constipation: Secondary | ICD-10-CM | POA: Diagnosis present

## 2021-03-27 DIAGNOSIS — I480 Paroxysmal atrial fibrillation: Secondary | ICD-10-CM | POA: Diagnosis present

## 2021-03-27 DIAGNOSIS — G893 Neoplasm related pain (acute) (chronic): Secondary | ICD-10-CM | POA: Diagnosis present

## 2021-03-27 DIAGNOSIS — M4854XA Collapsed vertebra, not elsewhere classified, thoracic region, initial encounter for fracture: Secondary | ICD-10-CM | POA: Diagnosis present

## 2021-03-27 DIAGNOSIS — N400 Enlarged prostate without lower urinary tract symptoms: Secondary | ICD-10-CM | POA: Diagnosis present

## 2021-03-27 DIAGNOSIS — Z515 Encounter for palliative care: Secondary | ICD-10-CM | POA: Diagnosis not present

## 2021-03-27 DIAGNOSIS — I129 Hypertensive chronic kidney disease with stage 1 through stage 4 chronic kidney disease, or unspecified chronic kidney disease: Secondary | ICD-10-CM | POA: Diagnosis present

## 2021-03-27 DIAGNOSIS — T40425A Adverse effect of tramadol, initial encounter: Secondary | ICD-10-CM | POA: Diagnosis present

## 2021-03-27 DIAGNOSIS — I10 Essential (primary) hypertension: Secondary | ICD-10-CM | POA: Diagnosis not present

## 2021-03-27 DIAGNOSIS — Z5112 Encounter for antineoplastic immunotherapy: Secondary | ICD-10-CM | POA: Diagnosis present

## 2021-03-27 DIAGNOSIS — Z7901 Long term (current) use of anticoagulants: Secondary | ICD-10-CM | POA: Diagnosis not present

## 2021-03-27 DIAGNOSIS — Z8249 Family history of ischemic heart disease and other diseases of the circulatory system: Secondary | ICD-10-CM | POA: Diagnosis not present

## 2021-03-27 DIAGNOSIS — K219 Gastro-esophageal reflux disease without esophagitis: Secondary | ICD-10-CM | POA: Diagnosis present

## 2021-03-27 DIAGNOSIS — Z20822 Contact with and (suspected) exposure to covid-19: Secondary | ICD-10-CM | POA: Diagnosis present

## 2021-03-27 DIAGNOSIS — N1832 Chronic kidney disease, stage 3b: Secondary | ICD-10-CM | POA: Diagnosis present

## 2021-03-27 DIAGNOSIS — Z6824 Body mass index (BMI) 24.0-24.9, adult: Secondary | ICD-10-CM | POA: Diagnosis not present

## 2021-03-27 DIAGNOSIS — K5900 Constipation, unspecified: Secondary | ICD-10-CM | POA: Diagnosis not present

## 2021-03-27 DIAGNOSIS — E781 Pure hyperglyceridemia: Secondary | ICD-10-CM | POA: Diagnosis present

## 2021-03-27 DIAGNOSIS — Z87891 Personal history of nicotine dependence: Secondary | ICD-10-CM | POA: Diagnosis not present

## 2021-03-27 DIAGNOSIS — R109 Unspecified abdominal pain: Secondary | ICD-10-CM | POA: Diagnosis not present

## 2021-03-27 DIAGNOSIS — D6489 Other specified anemias: Secondary | ICD-10-CM | POA: Diagnosis present

## 2021-03-27 LAB — CBC
HCT: 32.5 % — ABNORMAL LOW (ref 39.0–52.0)
Hemoglobin: 10.5 g/dL — ABNORMAL LOW (ref 13.0–17.0)
MCH: 31.2 pg (ref 26.0–34.0)
MCHC: 32.3 g/dL (ref 30.0–36.0)
MCV: 96.4 fL (ref 80.0–100.0)
Platelets: 212 10*3/uL (ref 150–400)
RBC: 3.37 MIL/uL — ABNORMAL LOW (ref 4.22–5.81)
RDW: 16.5 % — ABNORMAL HIGH (ref 11.5–15.5)
WBC: 4.8 10*3/uL (ref 4.0–10.5)
nRBC: 0 % (ref 0.0–0.2)

## 2021-03-27 LAB — RESPIRATORY PANEL BY PCR

## 2021-03-27 LAB — BASIC METABOLIC PANEL
Anion gap: 5 (ref 5–15)
BUN: 23 mg/dL (ref 8–23)
CO2: 27 mmol/L (ref 22–32)
Calcium: 8.3 mg/dL — ABNORMAL LOW (ref 8.9–10.3)
Chloride: 102 mmol/L (ref 98–111)
Creatinine, Ser: 0.94 mg/dL (ref 0.61–1.24)
GFR, Estimated: >60 mL/min (ref >60)
Glucose, Bld: 90 mg/dL (ref 70–99)
Potassium: 3.5 mmol/L (ref 3.5–5.1)
Sodium: 134 mmol/L — ABNORMAL LOW (ref 135–145)

## 2021-03-27 MED ORDER — OXYCODONE-ACETAMINOPHEN 5-325 MG PO TABS
1.0000 | ORAL_TABLET | Freq: Four times a day (QID) | ORAL | Status: DC | PRN
  Administered 2021-03-27: 2 via ORAL
  Filled 2021-03-27: qty 2

## 2021-03-27 MED ORDER — OXYCODONE-ACETAMINOPHEN 5-325 MG PO TABS
1.0000 | ORAL_TABLET | ORAL | Status: DC | PRN
Start: 2021-03-27 — End: 2021-03-27

## 2021-03-27 MED ORDER — POLYETHYLENE GLYCOL 3350 17 G PO PACK
17.0000 g | PACK | Freq: Two times a day (BID) | ORAL | Status: DC
  Administered 2021-03-27 – 2021-03-31 (×7): 17 g via ORAL
  Filled 2021-03-27 (×8): qty 1

## 2021-03-27 MED ORDER — DEXAMETHASONE 6 MG PO TABS
40.0000 mg | ORAL_TABLET | Freq: Every day | ORAL | Status: AC
  Administered 2021-03-28 – 2021-04-03 (×7): 40 mg via ORAL
  Filled 2021-03-27 (×7): qty 1

## 2021-03-27 MED ORDER — DOCUSATE SODIUM 100 MG PO CAPS: 100.0000 mg | ORAL_CAPSULE | Freq: Two times a day (BID) | ORAL | Status: DC | PRN

## 2021-03-27 MED ORDER — SODIUM CHLORIDE 0.9 % IV SOLN
Freq: Once | INTRAVENOUS | Status: AC
  Filled 2021-03-27: qty 250

## 2021-03-27 MED ORDER — BORTEZOMIB CHEMO SQ INJECTION 3.5 MG (2.5MG/ML)
1.3000 mg/m2 | Freq: Once | INTRAMUSCULAR | Status: AC
  Administered 2021-03-27: 2.5 mg via SUBCUTANEOUS
  Filled 2021-03-27: qty 1

## 2021-03-27 MED ORDER — VALACYCLOVIR HCL 500 MG PO TABS
500.0000 mg | ORAL_TABLET | Freq: Every day | ORAL | Status: DC
Start: 2021-03-27 — End: 2021-04-07
  Administered 2021-03-27 – 2021-04-07 (×12): 500 mg via ORAL
  Filled 2021-03-27 (×14): qty 1

## 2021-03-27 MED ORDER — PANTOPRAZOLE SODIUM 40 MG PO TBEC
40.0000 mg | DELAYED_RELEASE_TABLET | Freq: Every day | ORAL | Status: DC
  Administered 2021-03-27 – 2021-04-06 (×10): 40 mg via ORAL
  Filled 2021-03-27 (×11): qty 1

## 2021-03-27 MED ORDER — OXYCODONE HCL ER 10 MG PO T12A
10.0000 mg | EXTENDED_RELEASE_TABLET | Freq: Two times a day (BID) | ORAL | Status: DC
  Administered 2021-03-27 – 2021-03-30 (×7): 10 mg via ORAL
  Filled 2021-03-27 (×7): qty 1

## 2021-03-27 MED ORDER — MORPHINE SULFATE (PF) 2 MG/ML IV SOLN
2.0000 mg | Freq: Once | INTRAVENOUS | Status: AC
  Administered 2021-03-27: 2 mg via INTRAVENOUS
  Filled 2021-03-27: qty 1

## 2021-03-27 MED ORDER — GADOBUTROL 1 MMOL/ML IV SOLN
7.5000 mL | Freq: Once | INTRAVENOUS | Status: AC | PRN
  Administered 2021-03-27: 7.5 mL via INTRAVENOUS

## 2021-03-27 MED ORDER — DEXAMETHASONE 6 MG PO TABS
20.0000 mg | ORAL_TABLET | Freq: Every day | ORAL | Status: DC
  Administered 2021-03-27: 20 mg via ORAL
  Filled 2021-03-27 (×2): qty 2

## 2021-03-27 MED ORDER — HYDROMORPHONE HCL 1 MG/ML IJ SOLN
1.0000 mg | INTRAMUSCULAR | Status: DC | PRN
Start: 2021-03-27 — End: 2021-03-28

## 2021-03-27 MED ORDER — TRAMADOL HCL 50 MG PO TABS
50.0000 mg | ORAL_TABLET | Freq: Four times a day (QID) | ORAL | Status: DC | PRN
Start: 2021-03-27 — End: 2021-04-07
  Administered 2021-03-27 – 2021-04-05 (×3): 50 mg via ORAL
  Filled 2021-03-27 (×5): qty 1

## 2021-03-27 MED ORDER — GABAPENTIN 300 MG PO CAPS
300.0000 mg | ORAL_CAPSULE | Freq: Three times a day (TID) | ORAL | Status: DC
  Administered 2021-03-27 – 2021-04-02 (×18): 300 mg via ORAL
  Filled 2021-03-27 (×19): qty 1

## 2021-03-27 MED ORDER — ADULT MULTIVITAMIN W/MINERALS CH
1.0000 | ORAL_TABLET | Freq: Every day | ORAL | Status: DC
  Administered 2021-03-28 – 2021-04-07 (×11): 1 via ORAL
  Filled 2021-03-27 (×11): qty 1

## 2021-03-27 MED ORDER — DEXAMETHASONE 4 MG PO TABS
20.0000 mg | ORAL_TABLET | Freq: Once | ORAL | Status: AC
  Administered 2021-03-27: 20 mg via ORAL

## 2021-03-27 MED ORDER — DEXAMETHASONE 4 MG PO TABS
20.0000 mg | ORAL_TABLET | Freq: Once | ORAL | Status: AC
  Administered 2021-03-27: 20 mg via ORAL
  Filled 2021-03-27: qty 2

## 2021-03-27 MED ORDER — BACLOFEN 10 MG PO TABS
10.0000 mg | ORAL_TABLET | Freq: Three times a day (TID) | ORAL | Status: DC
  Administered 2021-03-27 – 2021-04-07 (×32): 10 mg via ORAL
  Filled 2021-03-27 (×36): qty 1

## 2021-03-27 MED ORDER — ENSURE ENLIVE PO LIQD
237.0000 mL | Freq: Two times a day (BID) | ORAL | Status: DC
  Administered 2021-03-28 – 2021-04-02 (×9): 237 mL via ORAL

## 2021-03-27 NOTE — Patient Instructions (Signed)
[facility] [place] [facility]  Discharge Instructions: Thank you for choosing [facility] to provide your oncology and hematology care.  If you have a lab appointment with the Cancer Center, please go directly to the Cancer Center and check in at the registration area.  Wear comfortable clothing and clothing appropriate for easy access to any Portacath or PICC line.   We strive to give you quality time with your provider. You may need to reschedule your appointment if you arrive late (15 or more minutes).  Arriving late affects you and other patients whose appointments are after yours.  Also, if you miss three or more appointments without notifying the office, you may be dismissed from the clinic at the provider's discretion.      For prescription refill requests, have your pharmacy contact our office and allow 72 hours for refills to be completed.    Today you received the following chemotherapy and/or immunotherapy agents: Velcade      To help prevent nausea and vomiting after your treatment, we encourage you to take your nausea medication as directed.  BELOW ARE SYMPTOMS THAT SHOULD BE REPORTED IMMEDIATELY: *FEVER GREATER THAN 100.4 F (38 C) OR HIGHER *CHILLS OR SWEATING *NAUSEA AND VOMITING THAT IS NOT CONTROLLED WITH YOUR NAUSEA MEDICATION *UNUSUAL SHORTNESS OF BREATH *UNUSUAL BRUISING OR BLEEDING *URINARY PROBLEMS (pain or burning when urinating, or frequent urination) *BOWEL PROBLEMS (unusual diarrhea, constipation, pain near the anus) TENDERNESS IN MOUTH AND THROAT WITH OR WITHOUT PRESENCE OF ULCERS (sore throat, sores in mouth, or a toothache) UNUSUAL RASH, SWELLING OR PAIN  UNUSUAL VAGINAL DISCHARGE OR ITCHING   Items with * indicate a potential emergency and should be followed up as soon as possible or go to the Emergency Department if any problems should occur.  Please show the CHEMOTHERAPY ALERT CARD or IMMUNOTHERAPY ALERT CARD at check-in to  the Emergency Department and triage nurse.  Should you have questions after your visit or need to cancel or reschedule your appointment, please contact [facility] [place] [facility]  [phone] and follow the prompts.  Office hours are 8:00 a.m. to 4:30 p.m. Monday - Friday. Please note that voicemails left after 4:00 p.m. may not be returned until the following business day.  We are closed weekends and major holidays. You have access to a nurse at all times for urgent questions. Please call the main number to the clinic [phone] and follow the prompts.  For any non-urgent questions, you may also contact your provider using MyChart. We now offer e-Visits for anyone 18 and older to request care online for non-urgent symptoms. For details visit [email or web address].   Also download the MyChart app! Go to the app store, search "MyChart", open the app, select [facility], and log in with your MyChart username and password.  Due to Covid, a mask is required upon entering the hospital/clinic. If you do not have a mask, one will be given to you upon arrival. For doctor visits, patients may have 1 support person aged 18 or older with them. For treatment visits, patients cannot have anyone with them due to current Covid guidelines and our immunocompromised population. Bortezomib injection What is this medication? BORTEZOMIB (bor TEZ oh mib) targets proteins in cancer cells and stops the cancer cells from growing. It treats multiple myeloma and mantle cell lymphoma. This medicine may be used for other purposes; ask your health care provider or pharmacist if you have questions. COMMON BRAND NAME(S): Velcade What should I   tell my care team before I take this medication? They need to know if you have any of these conditions: dehydration diabetes (high blood sugar) heart disease liver disease tingling of the fingers or toes or other nerve disorder an unusual or allergic reaction to  bortezomib, mannitol, boron, other medicines, foods, dyes, or preservatives pregnant or trying to get pregnant breast-feeding How should I use this medication? This medicine is injected into a vein or under the skin. It is given by a health care provider in a hospital or clinic setting. Talk to your health care provider about the use of this medicine in children. Special care may be needed. Overdosage: If you think you have taken too much of this medicine contact a poison control center or emergency room at once. NOTE: This medicine is only for you. Do not share this medicine with others. What if I miss a dose? Keep appointments for follow-up doses. It is important not to miss your dose. Call your health care provider if you are unable to keep an appointment. What may interact with this medication? This medicine may interact with the following medications: ketoconazole rifampin This list may not describe all possible interactions. Give your health care provider a list of all the medicines, herbs, non-prescription drugs, or dietary supplements you use. Also tell them if you smoke, drink alcohol, or use illegal drugs. Some items may interact with your medicine. What should I watch for while using this medication? Your condition will be monitored carefully while you are receiving this medicine. You may need blood work done while you are taking this medicine. You may get drowsy or dizzy. Do not drive, use machinery, or do anything that needs mental alertness until you know how this medicine affects you. Do not stand up or sit up quickly, especially if you are an older patient. This reduces the risk of dizzy or fainting spells This medicine may increase your risk of getting an infection. Call your health care provider for advice if you get a fever, chills, sore throat, or other symptoms of a cold or flu. Do not treat yourself. Try to avoid being around people who are sick. Check with your health care  provider if you have severe diarrhea, nausea, and vomiting, or if you sweat a lot. The loss of too much body fluid may make it dangerous for you to take this medicine. Do not become pregnant while taking this medicine or for 7 months after stopping it. Women should inform their health care provider if they wish to become pregnant or think they might be pregnant. Men should not father a child while taking this medicine and for 4 months after stopping it. There is a potential for serious harm to an unborn child. Talk to your health care provider for more information. Do not breast-feed an infant while taking this medicine or for 2 months after stopping it. This medicine may make it more difficult to get pregnant or father a child. Talk to your health care provider if you are concerned about your fertility. What side effects may I notice from receiving this medication? Side effects that you should report to your doctor or health care professional as soon as possible: allergic reactions (skin rash; itching or hives; swelling of the face, lips, or tongue) bleeding (bloody or black, tarry stools; red or dark brown urine; spitting up blood or brown material that looks like coffee grounds; red spots on the skin; unusual bruising or bleeding from the eye, gums, or   trouble breathing; fast, irregular heartbeat; sudden weight gain; swelling of the ankles, feet, hands) infection (fever, chills, cough, sore throat, pain or trouble passing urine) lack or loss of appetite liver injury (dark yellow or brown urine; general ill feeling or flu-like symptoms; loss of appetite, right upper belly pain; yellowing of the eyes or skin) low blood pressure (dizziness; feeling faint or lightheaded, falls; unusually weak or tired) muscle cramps pain, redness, or irritation at site where injected pain, tingling, numbness in the hands or  feet seizures trouble breathing unusual bruising or bleeding Side effects that usually do not require medical attention (report to yourdoctor or health care professional if they continue or are bothersome): diarrhea nausea stomach pain trouble sleeping vomiting This list may not describe all possible side effects. Call your doctor for medical advice about side effects. You may report side effects to FDA at1-800-FDA-1088. Where should I keep my medication? This medicine is given in a hospital or clinic. It will not be stored at home. NOTE: This sheet is a summary. It may not cover all possible information. If you have questions about this medicine, talk to your doctor, pharmacist, orhealth care provider.  2022 [employer] (2020-08-21 13:22:53)

## 2021-03-27 NOTE — Consult Note (Signed)
[facility] CONSULT NOTE  Patient Care Team: [name], MD as PCP - General (Family Medicine) End, [name], MD as PCP - Cardiology (Cardiology) Phadke, Karsten Ro, MD (Endocrinology)  CHIEF COMPLAINTS/PURPOSE OF CONSULTATION: Multiple myeloma  HISTORY OF PRESENTING ILLNESS:  Jack [name] 87 y.o.  male patient with a new diagnosis of multiple myeloma is currently admitted hospital for intractable back pain/right flank pain.  Patient was recently diagnosed with multiple myeloma after admission for vertebral fracture-bone biopsy positive for plasma cell neoplasm.  Patient had a PET scan-July 2022 which showed multiple bone lesions.  Patient was evaluated in the clinic on the day of admission-for worsening right flank pain/back pain.  Patient was then sent over to the emergency room for admission/and initiation of treatment.  Patient complains of poor appetite.  Positive weight loss.  Positive for constipation or diarrhea.  Complains overall weakness.  On admission patient's labs in the hospital were unremarkable for creatinine of 0.9 calcium of 8.3 mild anemia hemoglobin of 10.5.  Of note in June 2022 M protein 0.1 g/dL.  Kappa lambda ratio 5400/ 4.3= 1250.  M spike in urine 2.2 g per 24 hours.  Patient continues to complain of pain in the right flank abdomen overnight.  Patient has received gabapentin and oxycodone overnight without any significant relief.  Review of Systems  Constitutional:  Positive for malaise/fatigue and weight loss. Negative for chills, diaphoresis and fever.  HENT:  Negative for nosebleeds and sore throat.   Eyes:  Negative for double vision.  Respiratory:  Negative for cough, hemoptysis, sputum production, shortness of breath and wheezing.   Cardiovascular:  Negative for chest pain, palpitations, orthopnea and leg swelling.  Gastrointestinal:  Positive for abdominal pain, constipation and nausea. Negative for blood in stool, diarrhea,  heartburn, melena and vomiting.  Genitourinary:  Negative for dysuria, frequency and urgency.  Musculoskeletal:  Positive for back pain and myalgias. Negative for joint pain.  Skin: Negative.  Negative for itching and rash.  Neurological:  Positive for weakness. Negative for dizziness, tingling, focal weakness and headaches.  Endo/Heme/Allergies:  Does not bruise/bleed easily.  Psychiatric/Behavioral:  Negative for depression. The patient is not nervous/anxious and does not have insomnia.     MEDICAL HISTORY:  Past Medical History:  Diagnosis Date   Elevated prostate specific antigen (PSA)    has been 7 for a year    GERD (gastroesophageal reflux disease)    History of colon polyps 2008   [facility],    History of kidney stones    Hyperlipidemia    Hypertension    Prostate hypertrophy    diagnosed at age 36 due to hematospermia   Renal disorder     SURGICAL HISTORY: Past Surgical History:  Procedure Laterality Date   CATARACT EXTRACTION W/PHACO Left 01/10/2018   Procedure: CATARACT EXTRACTION PHACO AND INTRAOCULAR LENS PLACEMENT ([place]);  Surgeon: Birder Robson, MD;  Location: [facility] ORS;  Service: Ophthalmology;  Laterality: Left;  [name] 00:24.8 AP% 14.9 CDE 3.68 Fluid pack lot # [id number]   CATARACT EXTRACTION W/PHACO Right 01/25/2018   Procedure: CATARACT EXTRACTION PHACO AND INTRAOCULAR LENS PLACEMENT ([facility]);  Surgeon: Birder Robson, MD;  Location: [facility] ORS;  Service: Ophthalmology;  Laterality: Right;  [name] 00:42 AP% 10.8 CDE 4.59 Fluid pack lot # [id number]   COLON SURGERY     CYSTOSCOPY W/ URETERAL STENT PLACEMENT Right 10/16/2017   Procedure: right  URETERAL STENT PLACEMENT,cystoscopy bilateral stent removal,rretrograde;  Surgeon: Abbie Sons, MD;  Location: [facility] ORS;  Service: Urology;  Laterality: Right;   CYSTOSCOPY/URETEROSCOPY/HOLMIUM LASER/STENT PLACEMENT Right 12/16/2020   Procedure: CYSTOSCOPY/URETEROSCOPY/HOLMIUM LASER/STENT PLACEMENT;  Surgeon: Abbie Sons, MD;  Location: [facility] ORS;  Service: Urology;  Laterality: Right;   EXTRACORPOREAL SHOCK WAVE LITHOTRIPSY Right 12/11/2020   Procedure: EXTRACORPOREAL SHOCK WAVE LITHOTRIPSY (ESWL);  Surgeon: Abbie Sons, MD;  Location: [facility] OR[facility];  Service: Urology;  Laterality: Right;   IR KYPHO EA ADDL LEVEL THORACIC OR LUMBAR  02/02/2021   IR KYPHO LUMBAR INC FX REDUCE BONE BX UNI/BIL CANNULATION INC/IMAGING  02/02/2021   KIDNEY STONE SURGERY     KYPHOPLASTY N/A 03/12/2021   Procedure: Hewitt Shorts, L3;  Surgeon: Hessie Knows, MD;  Location: [facility] ORS;  Service: Orthopedics;  Laterality: N/A;   RESECTION SOFT TISSUE TUMOR LEG / ANKLE RADICAL  jan 2009   [facility],  right thigh/knee , nonmalignant   SMALL INTESTINE SURGERY  1946   implaed on picket fence, punctured stomach   TONSILLECTOMY      SOCIAL HISTORY: Social History   Socioeconomic History   Marital status: Married    Spouse name: Geologist, engineering   Number of children: 4   Years of education: 16   Highest education level: Not on file  Occupational History   Occupation: Retired    Fish farm manager: retired    Comment: Real Estate   Tobacco Use   Smoking status: Former    Types: Cigarettes    Quit date: 07/05/1965    Years since quitting: 55.7   Smokeless tobacco: Never  Vaping Use   Vaping Use: Never used  Substance and Sexual Activity   Alcohol use: Yes    Alcohol/week: 1.0 standard drink    Types: 1 Standard drinks or equivalent per week    Comment: occassionaly   Drug use: No   Sexual activity: Yes    Birth control/protection: None  Other Topics Concern   Not on file  Social History Narrative   Not on file   Social Determinants of Health   Financial Resource Strain: Not on file  Food Insecurity: Not on file  Transportation Needs: Not on file  Physical Activity: Not on file  Stress: Not on file  Social Connections: Not on file  Intimate Partner Violence: Not on file    FAMILY HISTORY: Family History  Problem Relation Age of  Onset   Hypertension Father    Hyperlipidemia Father    Cancer Sister        thyroid - dx in late 20's    ALLERGIES:  is allergic to quinolones, amlodipine, azithromycin, lisinopril, and morphine and related.  MEDICATIONS:  Current Facility-Administered Medications  Medication Dose Route Frequency Provider Last Rate Last Admin   acetaminophen (TYLENOL) tablet 650 mg  650 mg Oral Q6H PRN Cox, Amy N, DO       Or   acetaminophen (TYLENOL) suppository 650 mg  650 mg Rectal Q6H PRN Cox, Amy N, DO       apixaban (ELIQUIS) tablet 2.5 mg  2.5 mg Oral BID Cox, Amy N, DO   2.5 mg at 03/27/21 2243   atorvastatin (LIPITOR) tablet 40 mg  40 mg Oral QHS Cox, Amy N, DO   40 mg at 03/27/21 2242   baclofen (LIORESAL) tablet 10 mg  10 mg Oral TID Fritzi Mandes, MD   10 mg at 03/27/21 2242   carvedilol (COREG) tablet 25 mg  25 mg Oral BID WC Cox, Amy N, DO   25 mg at 03/27/21 1606   [START ON  03/28/2021] dexamethasone (DECADRON) tablet 40 mg  40 mg Oral Daily Cammie Sickle, MD       docusate sodium (COLACE) capsule 100 mg  100 mg Oral BID PRN Fritzi Mandes, MD       Derrill Memo ON 03/28/2021] feeding supplement (ENSURE ENLIVE / ENSURE PLUS) liquid 237 mL  237 mL Oral BID BM Fritzi Mandes, MD       gabapentin (NEURONTIN) capsule 300 mg  300 mg Oral TID Fritzi Mandes, MD   300 mg at 03/27/21 2243   HYDROmorphone (DILAUDID) injection 1 mg  1 mg Intravenous Q4H PRN Fritzi Mandes, MD       lidocaine (LIDODERM) 5 % 1 patch  1 patch Transdermal Q24H Cox, Amy N, DO   1 patch at 03/26/21 2018   [START ON 03/28/2021] multivitamin with minerals tablet 1 tablet  1 tablet Oral Daily Fritzi Mandes, MD       ondansetron [facility]) tablet 4 mg  4 mg Oral Q6H PRN Cox, Amy N, DO       Or   ondansetron (ZOFRAN) injection 4 mg  4 mg Intravenous Q6H PRN Cox, Amy N, DO       oxyCODONE (OXYCONTIN) 12 hr tablet 10 mg  10 mg Oral Q12H Fritzi Mandes, MD   10 mg at 03/27/21 2243   pantoprazole (PROTONIX) EC tablet 40 mg  40 mg Oral QHS Charlaine Dalton R, MD   40 mg at 03/27/21 2242   polyethylene glycol (MIRALAX / GLYCOLAX) packet 17 g  17 g Oral BID Fritzi Mandes, MD   17 g at 03/27/21 2242   senna-docusate (Senokot-S) tablet 1 tablet  1 tablet Oral BID Cox, Amy N, DO   1 tablet at 03/27/21 2243   traMADol (ULTRAM) tablet 50 mg  50 mg Oral Q6H PRN Fritzi Mandes, MD   50 mg at 03/27/21 1704   valACYclovir (VALTREX) tablet 500 mg  500 mg Oral Daily Cammie Sickle, MD   500 mg at 03/27/21 0937      .  PHYSICAL EXAMINATION:  Vitals:   03/27/21 0742 03/27/21 1928  BP:  126/79  Pulse: 65 69  Resp: 20 20  Temp: 97.6 F (36.4 C) 97.9 F (36.6 C)  SpO2: 100% 97%   Filed Weights   03/26/21 1536 03/26/21 2207  Weight: 180 lb (81.6 kg) 166 lb 0.1 oz (75.3 kg)    Physical Exam Vitals and nursing note reviewed.  Constitutional:      Comments: Patient resting in the bed.  Alone.  HENT:     Head: Normocephalic and atraumatic.     Mouth/Throat:     Mouth: Mucous membranes are moist.     Pharynx: No oropharyngeal exudate.  Eyes:     Pupils: Pupils are equal, round, and reactive to light.  Cardiovascular:     Rate and Rhythm: Normal rate and regular rhythm.  Pulmonary:     Effort: No respiratory distress.     Breath sounds: No wheezing.     Comments: Decreased breath sounds bilaterally at bases.  No wheeze or crackles Abdominal:     General: Bowel sounds are normal. There is no distension.     Palpations: Abdomen is soft. There is no mass.     Tenderness: There is no abdominal tenderness. There is no guarding or rebound.     Comments: Right flank distention noted.  Otherwise soft; no rigidity.  Positive for tenderness.  Musculoskeletal:        General:  No tenderness. Normal range of motion.     Cervical back: Normal range of motion and neck supple.  Skin:    General: Skin is warm.  Neurological:     Mental Status: He is alert and oriented to person, place, and time.  Psychiatric:        Mood and Affect: Affect  normal.        Judgment: Judgment normal.     LABORATORY DATA:  I have reviewed the data as listed Lab Results  Component Value Date   WBC 4.8 03/27/2021   HGB 10.5 (L) 03/27/2021   HCT 32.5 (L) 03/27/2021   MCV 96.4 03/27/2021   PLT 212 03/27/2021   Recent Labs    02/24/21 1014 03/11/21 0829 03/12/21 0730 03/17/21 0506 03/26/21 1549 03/27/21 0646  NA 136 136   < > 137 135 134*  K 4.9 3.7   < > 4.5 4.2 3.5  CL 97 101   < > 101 100 102  CO2 24 20*   < > '29 22 27  ' GLUCOSE 99 131*   < > 128* 98 90  BUN 23 16   < > 46* 29* 23  CREATININE 1.90* 1.48*   < > 1.32* 1.15 0.94  CALCIUM 9.9 10.0   < > 9.6 9.2 8.3*  GFRNONAA  --  46*   < > 53* >60 >60  PROT 6.2 6.7  --   --  6.0*  --   ALBUMIN 4.5 4.1  --   --  3.8  --   AST 14 23  --   --  41  --   ALT 7 8  --   --  25  --   ALKPHOS 195* 149*  --   --  104  --   BILITOT 0.4 1.0  --   --  1.4*  --    < > = values in this interval not displayed.    RADIOGRAPHIC STUDIES: I have personally reviewed the radiological images as listed and agreed with the findings in the report. DG Lumbar Spine 2-3 Views  Result Date: 03/12/2021 CLINICAL DATA:  Provided history: Fracture. Additional history provided: L2-L3 kyphoplasty. Provided fluoroscopy time 2 minutes, 27 seconds. EXAM: LUMBAR SPINE - 2-3 VIEW; DG C-ARM 1-60 MIN COMPARISON:  CT abdomen/pelvis 03/11/2021. FINDINGS: AP and lateral view intraprocedural fluoroscopic images of the lumbar spine are submitted, 2 images total. On the provided images, kyphoplasty material is present within three contiguous lumbar vertebral bodies (these are presumably the L1, L2 and L3 vertebral bodies given the provided history and findings on prior imaging. However, neither the thoracolumbar nor lumbosacral junction are included in the field of view to allow exact level determination). IMPRESSION: Two intraprocedural fluoroscopic images of the lumbar spine from reported L2 and L3 kyphoplasty, as described.  Electronically Signed   By: Kellie Simmering DO   On: 03/12/2021 18:03   MR Lumbar Spine W Wo Contrast  Result Date: 03/27/2021 CLINICAL DATA:  Low back pain, history of multiple myeloma EXAM: MRI LUMBAR SPINE WITHOUT AND WITH CONTRAST TECHNIQUE: Multiplanar and multiecho pulse sequences of the lumbar spine were obtained without and with intravenous contrast. CONTRAST:  7.29m GADAVIST GADOBUTROL 1 MMOL/ML IV SOLN COMPARISON:  01/29/2021 FINDINGS: Segmentation:  Standard. Alignment:  No significant listhesis. Vertebrae: There are pathologic compression fractures at T12-L4. Progressed since the prior MRI but present on other interval imaging with exception of L4. Cement augmentation is present at T12-L3. Minor endplate retropulsion, greatest at  T12 and L1. There is mildly heterogeneous marrow signal with patchy STIR hyperintensity and enhancement. New mild ventral epidural soft tissue extension is present at L1 to L2-L3. Conus medullaris and cauda equina: Conus extends to the L1-L2 level. Conus and cauda equina appear normal. No abnormal intrathecal enhancement. Paraspinal and other soft tissues: Unremarkable. Disc levels: L1-L2:  No significant canal or foraminal stenosis. L2-L3: Disc bulge. Facet arthropathy with ligamentum flavum infolding. No significant canal or foraminal stenosis. L3-L4: Disc bulge. Facet arthropathy with ligamentum flavum infolding. No significant canal or foraminal stenosis. L4-L5: Disc bulge. Facet arthropathy with ligamentum flavum infolding. No significant canal or foraminal stenosis. L5-S1:  No significant canal or foraminal stenosis. IMPRESSION: Pathologic compression fractures of T12-L4 with cement augmentation at T12-L3. Heterogeneous marrow signal a and enhancement likely reflecting known myeloma. There is new mild ventral epidural soft tissue extension at L1 to L2-L3. No significant canal or foraminal narrowing at any level. Electronically Signed   By: Macy Mis M.D.   On:  03/27/2021 14:39   CT ABDOMEN PELVIS W CONTRAST  Result Date: 03/26/2021 CLINICAL DATA:  Abdominal distension and abdominal pain EXAM: CT ABDOMEN AND PELVIS WITH CONTRAST TECHNIQUE: Multidetector CT imaging of the abdomen and pelvis was performed using the standard protocol following bolus administration of intravenous contrast. CONTRAST:  181m OMNIPAQUE IOHEXOL 350 MG/ML SOLN COMPARISON:  03/11/2021 FINDINGS: Lower chest: Unremarkable. Hepatobiliary: No suspicious focal abnormality within the liver parenchyma. There is no evidence for gallstones, gallbladder wall thickening, or pericholecystic fluid. No intrahepatic or extrahepatic biliary dilation. Pancreas: No focal mass lesion. No dilatation of the main duct. No intraparenchymal cyst. No peripancreatic edema. Spleen: No splenomegaly. No focal mass lesion. Adrenals/Urinary Tract: No adrenal nodule or mass. Kidneys unremarkable. No evidence for hydroureter. The urinary bladder appears normal for the degree of distention. Stomach/Bowel: Tiny hiatal hernia. Stomach otherwise unremarkable. Duodenum is normally positioned as is the ligament of Treitz. No small bowel wall thickening. No small bowel dilatation. The terminal ileum is normal. The appendix is normal. No gross colonic mass. No colonic wall thickening. No substantial diverticular disease in the left colon. Colon is diffusely fluid-filled with fluid visible in the rectum, imaging findings that could be consistent diarrhea. No colonic wall thickening. Vascular/Lymphatic: There is moderate atherosclerotic calcification of the abdominal aorta without aneurysm. There is no gastrohepatic or hepatoduodenal ligament lymphadenopathy. No retroperitoneal or mesenteric lymphadenopathy. No pelvic sidewall lymphadenopathy. Reproductive: Prostate gland is markedly enlarged. Other: No intraperitoneal free fluid. Musculoskeletal: Mesh in the left groin region is compatible with prior herniorrhaphy. Bony mineralization  is diffusely heterogeneous in this patient with a history of multiple myeloma. Patient is status post multiple level vertebral augmentation in the lower thoracic and lumbar spine. IMPRESSION: 1. No acute findings in the abdomen or pelvis. 2. Fluid in the right and transverse colon with visible fluid in the rectum. Imaging findings could be consistent with diarrhea. No evidence for colonic wall thickening to suggest colitis. 3. Marked prostatomegaly. 4. Tiny hiatal hernia. 5. Aortic Atherosclerosis (ICD10-I70.0). Electronically Signed   By: EMisty StanleyM.D.   On: 03/26/2021 18:09   CT ABDOMEN PELVIS W CONTRAST  Result Date: 03/11/2021 CLINICAL DATA:  87year old male with abdominal pain. Recent diagnosis of MGUS, concern for multiple myeloma. EXAM: CT ABDOMEN AND PELVIS WITH CONTRAST TECHNIQUE: Multidetector CT imaging of the abdomen and pelvis was performed using the standard protocol following bolus administration of intravenous contrast. CONTRAST:  1095mOMNIPAQUE IOHEXOL 300 MG/ML  SOLN COMPARISON:  Noncontrast CT Abdomen  and Pelvis 01/29/2021 and earlier. Thoracic and lumbar MRI 01/29/2021. FINDINGS: Lower chest: Stable small to moderate hiatal hernia. Tortuous thoracic aorta. Calcified coronary artery atherosclerosis. No cardiomegaly or pericardial effusion. No pleural effusion. Stable mild lung base atelectasis or scarring. Hepatobiliary: Gallbladder is at the upper limits of normal but does not appear inflamed. Negative liver. No bile duct enlargement. Pancreas: Negative. Spleen: Negative. Adrenals/Urinary Tract: Normal adrenal glands. Nonobstructed kidneys with symmetric renal enhancement and contrast excretion. There is probable small benign left renal parapelvic cyst accounting for the increased left renal hilum soft tissue on series 7, image 26, stable since 2018. Decompressed ureters. Decompressed and negative bladder. Stomach/Bowel: Negative large bowel aside from retained stool in the right  colon. Diminutive, negative appendix on coronal image 48. Flocculated material in the terminal ileum but no dilated small bowel. Aside from hiatal hernia the stomach is negative. Negative duodenum. No free air or free fluid. Vascular/Lymphatic: Aortoiliac calcified atherosclerosis. Major arterial structures remain patent in the abdomen and pelvis. Tortuous iliac arteries. No lymphadenopathy. Reproductive: Prostatomegaly. Previous left inguinal hernia repair with mesh appears stable. Otherwise negative. Other: No pelvic free fluid. Musculoskeletal: Diffuse osteopenia and heterogeneous bone mineralization similar to the CT last month, distinctly different from a 2018 CT. Interval augmentation of the T12 and L1 compression fractures. New/progressed compression fractures of L2 and L3 since last month. No significant retropulsion of bone or other complicating features. Bone mineralization is especially heterogeneous in the lumbar spine and pelvis, and these are likely pathologic fractures in the setting of MGUS/myeloma. No other acute osseous abnormality identified. IMPRESSION: 1. Diffusely abnormal bone mineralization in keeping with MGUS/Multiple Myeloma with new or progressed pathologic compression fractures of L2 and L3 since last month. Interval augmentation of T12 and L1. 2. No other acute finding in the abdomen or pelvis. Small to moderate hiatal hernia. Prostatomegaly. Aortic Atherosclerosis (ICD10-I70.0). Electronically Signed   By: Genevie Ann M.D.   On: 03/11/2021 09:53   NM PET Image Initial (PI) Whole Body  Result Date: 03/24/2021 CLINICAL DATA:  Initial treatment strategy for concern for multiple myeloma. Gammopathy with multiple M spikes. Prior kyphoplasty 12/16/2020 at L2-3. EXAM: NUCLEAR MEDICINE PET WHOLE BODY TECHNIQUE: 9.8 mCi F-18 FDG was injected intravenously. Full-ring PET imaging was performed from the head to foot after the radiotracer. CT data was obtained and used for attenuation correction  and anatomic localization. Fasting blood glucose: 91 mg/dl COMPARISON:  03/11/2021 abdominopelvic CT.  Chest CT 11/21/2017. FINDINGS: Mediastinal blood pool activity: SUV max 3.1 HEAD/NECK: No areas of abnormal hypermetabolism. Incidental CT findings: Cerebral atrophy is within normal variation for age. No cervical adenopathy. Left carotid atherosclerotic calcifications. CHEST: No thoracic nodal hypermetabolism. Distal esophageal hypermetabolism in the setting of a small hiatal hernia. This measures a S.U.V. max of 4.6, including on approximately 139/3. No pulmonary parenchymal hypermetabolism. Incidental CT findings: Aortic and coronary artery calcification. Ascending aortic dilatation again identified, including up to 4.6 cm. Mild cardiomegaly. Calcified left-sided pleural plaques. Centrilobular emphysema. No thoracic adenopathy. ABDOMEN/PELVIS: No abdominopelvic parenchymal or nodal hypermetabolism. Incidental CT findings: Normal adrenal glands. Proximal gastric underdistention. No abdominopelvic adenopathy. Moderate prostatomegaly. SKELETON: Multifocal thoracolumbar spine hypermetabolism. This is seen at the previously augmented levels and likely reactive. However, there is also hypermetabolism within the T11 vertebral body at a S.U.V. max of 5.1. Incidental CT findings: Diffuse heterogeneous osseous density, most consistent with myeloma involvement. EXTREMITIES: No areas of abnormal hypermetabolism. Incidental CT findings: Loose bodies about the right knee are likely within a popliteal cyst.  Subtle cortical irregularity which is most apparent in the femoral, less so humeral shafts, suspicious for myeloma involvement. IMPRESSION: 1. Heterogeneity throughout the marrow space, most consistent with the clinical history of multiple myeloma. The only suspicious area of osseous hypermetabolism is within the T11 vertebral body. 2. No findings of extra-osseous hypermetabolic myeloma. 3. Distal esophageal  hypermetabolism, suspicious for esophagitis. Concurrent small hiatal hernia. 4. Incidental findings, including: Similar ascending aortic aneurysm. Aortic atherosclerosis (ICD10-I70.0), coronary artery atherosclerosis and emphysema (ICD10-J43.9). Left-sided pleural partially calcified pleural plaques may relate to prior empyema or hemothorax. Prostatomegaly. Electronically Signed   By: Abigail Miyamoto M.D.   On: 03/24/2021 16:05   DG Chest Portable 1 View  Result Date: 03/26/2021 CLINICAL DATA:  87 year old male with right flank pain and rib fracture. EXAM: PORTABLE CHEST 1 VIEW COMPARISON:  Chest radiograph dated 02/02/2021. FINDINGS: No focal consolidation, pleural effusion, or pneumothorax. Apparent two small adjacent calcified nodules in the left upper lobe with combined dimension of 9 mm. Attention on follow-up imaging recommended. The cardiac silhouette is within limits. No acute osseous pathology. IMPRESSION: 1. No acute cardiopulmonary process. 2. A 9 mm left upper lobe calcified nodule. Electronically Signed   By: Anner Crete M.D.   On: 03/26/2021 19:08   DG C-Arm 1-60 Min  Result Date: 03/12/2021 CLINICAL DATA:  Provided history: Fracture. Additional history provided: L2-L3 kyphoplasty. Provided fluoroscopy time 2 minutes, 27 seconds. EXAM: LUMBAR SPINE - 2-3 VIEW; DG C-ARM 1-60 MIN COMPARISON:  CT abdomen/pelvis 03/11/2021. FINDINGS: AP and lateral view intraprocedural fluoroscopic images of the lumbar spine are submitted, 2 images total. On the provided images, kyphoplasty material is present within three contiguous lumbar vertebral bodies (these are presumably the L1, L2 and L3 vertebral bodies given the provided history and findings on prior imaging. However, neither the thoracolumbar nor lumbosacral junction are included in the field of view to allow exact level determination). IMPRESSION: Two intraprocedural fluoroscopic images of the lumbar spine from reported L2 and L3 kyphoplasty, as  described. Electronically Signed   By: Kellie Simmering DO   On: 03/12/2021 18:03    Kappa light chain myeloma [facility]) #87 year old male patient with newly diagnosed multiple myeloma is currently admitted to hospital for intractable back pain/right flank pain.  #Multiple myeloma new diagnosis-stage III -11:14 translocation high risk-/given symptomatic disease-recommend starting treatment with pulse dose of steroids dexamethasone 40 mg daily for 4 days.  And also start Velcade subcu twice weekly.   #Back pain/right flank pain-likely secondary to infiltration of the intercostal nerves by myeloma-continue IV narcotics; hopefully steroids-both anti-inflammatory/cytotoxic should help control the disease/and the pain.  Check MRI thoracic/lumbar spine with and without contrast.  #Shingles prophylaxis/valacyclovir once a day  #DVT prophylaxis/GI prophylaxis-Lovenox 40 mg; Protonix 40 mg once a day.  #Prognosis: Discussed with the patient/and [name] over the phone]-at length the incurable nature of the multiple myeloma.  However, patient should find relief from the chemotherapy/steroids over the next few days.  The median life expectancy is in order of years-however patient's individual life expectancy will depend upon his response to therapy/tolerance to therapy etc.  Thank you Dr. Posey Pronto for allowing me to participate in the care of your pleasant patient. Please do not hesitate to contact me with questions or concerns in the interim.  The above plan of care was discussed with Dr. Posey Pronto.    All questions were answered. The patient knows to call the clinic with any problems, questions or concerns.    Cammie Sickle, MD  03/27/2021 11:49 PM

## 2021-03-27 NOTE — Progress Notes (Signed)
[facility] [place] NAME: Jack [name]    MR#:  [id number]  DATE OF BIRTH:  December 23, 1932  SUBJECTIVE:   patient came in with ongoing acute on severe right flank pain REVIEW OF SYSTEMS:   Review of Systems  Constitutional:  Negative for chills, fever and weight loss.  HENT:  Negative for ear discharge, ear pain and nosebleeds.   Eyes:  Negative for blurred vision, pain and discharge.  Respiratory:  Negative for sputum production, shortness of breath, wheezing and stridor.   Cardiovascular:  Negative for chest pain, palpitations, orthopnea and PND.  Gastrointestinal:  Positive for abdominal pain. Negative for diarrhea, nausea and vomiting.  Genitourinary:  Negative for frequency and urgency.  Musculoskeletal:  Positive for back pain. Negative for joint pain.  Neurological:  Positive for weakness. Negative for sensory change, speech change and focal weakness.  Psychiatric/Behavioral:  Negative for depression and hallucinations. The patient is not nervous/anxious.   Tolerating Diet:yes Tolerating PT: HHPT  DRUG ALLERGIES:   Allergies  Allergen Reactions  . Quinolones     Aortic dilation contraindicates FQ  . Amlodipine Other (See Comments)    LE edema  . Azithromycin Nausea And Vomiting  . Lisinopril Cough  . Morphine And Related Nausea And Vomiting    VITALS:  Blood pressure (!) 174/99, pulse 65, temperature 97.6 F (36.4 C), temperature source Oral, resp. rate 20, height _0  (1.753 m), weight 75.3 kg, SpO2 100 %.  PHYSICAL EXAMINATION:   Physical Exam  GENERAL:  87 y.o.-year-old patient lying in the bed with acute distress due to pain  LUNGS: Normal breath sounds bilaterally, no wheezing, rales, rhonchi. No use of accessory muscles of respiration.  CARDIOVASCULAR: S1, S2 normal. No murmurs, rubs, or gallops.  ABDOMEN: Soft, nontender, nondistended. Bowel sounds present. No organomegaly or mass.  EXTREMITIES: No cyanosis,  clubbing or edema b/l.    NEUROLOGIC: non focal PSYCHIATRIC:  patient is alert and oriented x 3.  SKIN: No obvious rash, lesion, or ulcer.   LABORATORY PANEL:  CBC Recent Labs  Lab 03/27/21 0646  WBC 4.8  HGB 10.5*  HCT 32.5*  PLT 212    Chemistries  Recent Labs  Lab 03/26/21 1549 03/27/21 0646  NA 135 134*  K 4.2 3.5  CL 100 102  CO2 22 27  GLUCOSE 98 90  BUN 29* 23  CREATININE 1.15 0.94  CALCIUM 9.2 8.3*  AST 41  --   ALT 25  --   ALKPHOS 104  --   BILITOT 1.4*  --    Cardiac Enzymes No results for input(s): TROPONINI in the last 168 hours. RADIOLOGY:  MR Lumbar Spine W Wo Contrast  Result Date: 03/27/2021 CLINICAL DATA:  Low back pain, history of multiple myeloma EXAM: MRI LUMBAR SPINE WITHOUT AND WITH CONTRAST TECHNIQUE: Multiplanar and multiecho pulse sequences of the lumbar spine were obtained without and with intravenous contrast. CONTRAST:  7.67m GADAVIST GADOBUTROL 1 MMOL/ML IV SOLN COMPARISON:  01/29/2021 FINDINGS: Segmentation:  Standard. Alignment:  No significant listhesis. Vertebrae: There are pathologic compression fractures at T12-L4. Progressed since the prior MRI but present on other interval imaging with exception of L4. Cement augmentation is present at T12-L3. Minor endplate retropulsion, greatest at T12 and L1. There is mildly heterogeneous marrow signal with patchy STIR hyperintensity and enhancement. New mild ventral epidural soft tissue extension is present at L1 to L2-L3. Conus medullaris and cauda equina: Conus extends to the L1-L2 level. Conus and  cauda equina appear normal. No abnormal intrathecal enhancement. Paraspinal and other soft tissues: Unremarkable. Disc levels: L1-L2:  No significant canal or foraminal stenosis. L2-L3: Disc bulge. Facet arthropathy with ligamentum flavum infolding. No significant canal or foraminal stenosis. L3-L4: Disc bulge. Facet arthropathy with ligamentum flavum infolding. No significant canal or foraminal stenosis.  L4-L5: Disc bulge. Facet arthropathy with ligamentum flavum infolding. No significant canal or foraminal stenosis. L5-S1:  No significant canal or foraminal stenosis. IMPRESSION: Pathologic compression fractures of T12-L4 with cement augmentation at T12-L3. Heterogeneous marrow signal a and enhancement likely reflecting known myeloma. There is new mild ventral epidural soft tissue extension at L1 to L2-L3. No significant canal or foraminal narrowing at any level. Electronically Signed   By: Macy Mis M.D.   On: 03/27/2021 14:39   CT ABDOMEN PELVIS W CONTRAST  Result Date: 03/26/2021 CLINICAL DATA:  Abdominal distension and abdominal pain EXAM: CT ABDOMEN AND PELVIS WITH CONTRAST TECHNIQUE: Multidetector CT imaging of the abdomen and pelvis was performed using the standard protocol following bolus administration of intravenous contrast. CONTRAST:  110m OMNIPAQUE IOHEXOL 350 MG/ML SOLN COMPARISON:  03/11/2021 FINDINGS: Lower chest: Unremarkable. Hepatobiliary: No suspicious focal abnormality within the liver parenchyma. There is no evidence for gallstones, gallbladder wall thickening, or pericholecystic fluid. No intrahepatic or extrahepatic biliary dilation. Pancreas: No focal mass lesion. No dilatation of the main duct. No intraparenchymal cyst. No peripancreatic edema. Spleen: No splenomegaly. No focal mass lesion. Adrenals/Urinary Tract: No adrenal nodule or mass. Kidneys unremarkable. No evidence for hydroureter. The urinary bladder appears normal for the degree of distention. Stomach/Bowel: Tiny hiatal hernia. Stomach otherwise unremarkable. Duodenum is normally positioned as is the ligament of Treitz. No small bowel wall thickening. No small bowel dilatation. The terminal ileum is normal. The appendix is normal. No gross colonic mass. No colonic wall thickening. No substantial diverticular disease in the left colon. Colon is diffusely fluid-filled with fluid visible in the rectum, imaging findings  that could be consistent diarrhea. No colonic wall thickening. Vascular/Lymphatic: There is moderate atherosclerotic calcification of the abdominal aorta without aneurysm. There is no gastrohepatic or hepatoduodenal ligament lymphadenopathy. No retroperitoneal or mesenteric lymphadenopathy. No pelvic sidewall lymphadenopathy. Reproductive: Prostate gland is markedly enlarged. Other: No intraperitoneal free fluid. Musculoskeletal: Mesh in the left groin region is compatible with prior herniorrhaphy. Bony mineralization is diffusely heterogeneous in this patient with a history of multiple myeloma. Patient is status post multiple level vertebral augmentation in the lower thoracic and lumbar spine. IMPRESSION: 1. No acute findings in the abdomen or pelvis. 2. Fluid in the right and transverse colon with visible fluid in the rectum. Imaging findings could be consistent with diarrhea. No evidence for colonic wall thickening to suggest colitis. 3. Marked prostatomegaly. 4. Tiny hiatal hernia. 5. Aortic Atherosclerosis (ICD10-I70.0). Electronically Signed   By: EMisty StanleyM.D.   On: 03/26/2021 18:09   DG Chest Portable 1 View  Result Date: 03/26/2021 CLINICAL DATA:  87year old male with right flank pain and rib fracture. EXAM: PORTABLE CHEST 1 VIEW COMPARISON:  Chest radiograph dated 02/02/2021. FINDINGS: No focal consolidation, pleural effusion, or pneumothorax. Apparent two small adjacent calcified nodules in the left upper lobe with combined dimension of 9 mm. Attention on follow-up imaging recommended. The cardiac silhouette is within limits. No acute osseous pathology. IMPRESSION: 1. No acute cardiopulmonary process. 2. A 9 mm left upper lobe calcified nodule. Electronically Signed   By: AAnner CreteM.D.   On: 03/26/2021 19:08   ASSESSMENT AND PLAN:  Jack [name] is a 87 y.o. male with medical history significant for paroxysmal atrial fibrillation, PACs, coronary calcium and aortic atherosclerosis,  diastolic dysfunction, C0KL, GERD, aortic root dilatation, ascending aortic dilatation, hypertension, GERD, hypertriglyceridemia, CKD stage IIIb, BPH, multiple myeloma, prior tobacco use, presents to the emergency department from oncology clinic for persistent left flank pain.  Severe back pain radiating to the rightflank suspected due to pathology compression fracture T12--L4 Radiculopathy -- IV Dilaudid PRN, PRN tramadol, OxyContin BID -- monitor respiratory status -- gabapentin increase dose to 300 mg TID,continue baclofen -- patient has history of compression fractures in the past underwent kyphoplasty x2 (per old record)  multiple myeloma with pathology compression fracture -- patient seen by Dr. Rogue Bussing -- started on prednisone and Valtrex  chronic constipation -- continue bowel prep  Hypertension -- continue Coreg  history of a fib -- continue Coreg and eliquis   Nutrition Status: Nutrition Problem: Severe Malnutrition (in the context of chronic illness) Etiology: decreased appetite Signs/Symptoms: severe muscle depletion, severe fat depletion, percent weight loss (17% weight loss x 3 months) Percent weight loss: 17 % Interventions: Ensure Enlive (each supplement provides 350kcal and 20 grams of protein), MVI, Liberalize Diet  Family communication : Consults : oncology CODE STATUS: full DVT Prophylaxis : eliquis Level of care: Med-Surg Status is: Inpatient  Remains inpatient appropriate because:Ongoing active pain requiring inpatient pain management  Dispo: The patient is from: Home              Anticipated d/c is to: Home              Patient currently is not medically stable to d/c.   Difficult to place patient No        TOTAL TIME TAKING CARE OF THIS PATIENT: 25 minutes.  >50% time spent on counselling and coordination of care  Note: This dictation was prepared with Dragon dictation along with smaller phrase technology. Any transcriptional errors that  result from this process are unintentional.  Fritzi Mandes M.D    [facility]   CC: Primary care physician; [name], MD Patient ID: Jack [name], male   DOB: [birth year], 87 y.o.   MRN: [id number]

## 2021-03-27 NOTE — Progress Notes (Signed)
Patient came to infusion at the [facility] for new subq Velcade treatment.  Patient was moaning and complaining of 10/10 pain in bilateral sides when he arrived.  Treatment given as ordered. Morphine IV given per Dr Rogue Bussing.  Patient educated on Velcade and reminded to use the call bell when he returns to his room for pain meds.  Report called to Manuela Schwartz on [place] and transported back to rm 203.

## 2021-03-27 NOTE — Assessment & Plan Note (Addendum)
#  87 year old male patient with newly diagnosed multiple myeloma is currently admitted to hospital for intractable back pain/right flank pain.  # Multiple myeloma new diagnosis-stage III -[11:14 translocation high risk] -/given symptomatic disease currently on pulse dose of steroids dexamethasone 40 mg daily for 2/4 days; also on Velcade subcu twice weekly.-Today cycle #1 day# 4 on 7/18.  Also scheduled for day 8 and day 11velcade.   #Back pain/right flank pain-likely secondary to infiltration of the intercostal nerves by myeloma-T-spine MRI confirms the suspicion.  Also L-spine MRI shows enhancement of the ventral epidural soft tissue extension to L1-L2-L3.  Discussed with radiation oncology Dr. Crystal-recommend starting radiation ASAP-given the epidural tumor/enhancement along the thoracic and lumbar nerve roots.  Also discussed with Dr.Menz-plan to hold off kyphoplasty at this time.  We will increase the dose of OxyContin to 20 mg twice daily; continue oxycodone 5 to 10 mg every 6-8 hours as needed.  Would also recommend evaluation with Josh Borders the morning.  #Shingles prophylaxis/valacyclovir once a day  #DVT prophylaxis/GI prophylaxis-discontinue Eliquis; Lovenox 40 mg daily daily; Protonix 40 mg once a day.  The above plan of care was discussed with Dr. Posey Pronto; Dr.Menz & Dr.Chrystal.   We will discuss with Josh Borders in the morning.

## 2021-03-27 NOTE — Progress Notes (Signed)
Initial Nutrition Assessment  DOCUMENTATION CODES:  Severe malnutrition in context of chronic illness  INTERVENTION:  Liberalize diet to regular to encourage PO intake. Ensure Enlive po BID, each supplement provides 350 kcal and 20 grams of protein MVI with minerals daily  NUTRITION DIAGNOSIS:  Severe Malnutrition (in the context of chronic illness) related to decreased appetite as evidenced by severe muscle depletion, severe fat depletion, percent weight loss (17% weight loss x 3 months).  GOAL:  Patient will meet greater than or equal to 90% of their needs  MONITOR:  PO intake, Supplement acceptance, Labs, Weight trends  REASON FOR ASSESSMENT:  Malnutrition Screening Tool    ASSESSMENT:  87 y.o. male with medical history significant for atrial fibrillation, GERD, HTN, HLD, CKD stage IIIb, BPH, recent dx of multiple myeloma, and prior tobacco use, sent to ED from oncology clinic for persistent left flank pain.   Pt recently admitted earlier this month for pain. Followed by nutrition services at that time. Newly dx with multiple myeloma. Oncology team plans to start treatment during this admission.   Noted a 17% weight loss in the last three months (4/14-7/14) which is severe.   Pt resting in bed at the time of visit. Pt had first cancer treatment earlier today. Reports he is in pain, but this is not unusual from his baseline the last few months. Discussed recent intake. Endorses that appetite has been on and off at home, but he has been utilizing nutrition supplements when he is feeling that he is not taking enough in. Also endorses weight loss and lack of energy. Significant deficits are detected on exam.   Talked to pt about his increased energy needs now that cancer treatment has begun. Discussed intake of adequate nutrition at home and high kcal/protein snacks that could be used if intake is poor. Pt is agreeable to receiving ensure during stay, encouraged meal consumption and to  used ensure as a snack. Pt agreeable with plan.   Nutritionally Relevant Medications: Scheduled Meds:  atorvastatin  40 mg Oral QHS   baclofen  10 mg Oral TID   dexamethasone  20 mg Oral Daily   pantoprazole  40 mg Oral QHS   polyethylene glycol  17 g Oral Daily   senna-docusate  1 tablet Oral BID   PRN Meds: docusate sodium, ondansetron  Labs Reviewed: Na 134  NUTRITION - FOCUSED PHYSICAL EXAM: Flowsheet Row Most Recent Value  Orbital Region Mild depletion  Upper Arm Region Severe depletion  Thoracic and Lumbar Region Moderate depletion  Buccal Region Mild depletion  Temple Region Mild depletion  Clavicle Bone Region Moderate depletion  Clavicle and Acromion Bone Region Moderate depletion  Scapular Bone Region Moderate depletion  Dorsal Hand Severe depletion  Patellar Region Severe depletion  Anterior Thigh Region Severe depletion  Posterior Calf Region Severe depletion  Edema (RD Assessment) None  Hair Reviewed  Eyes Reviewed  Mouth Reviewed  Skin Reviewed  Nails Reviewed   Diet Order:   Diet Order             Diet regular Room service appropriate? Yes; Fluid consistency: Thin  Diet effective now                   EDUCATION NEEDS:  No education needs have been identified at this time  Skin:  Skin Assessment: Reviewed RN Assessment  Last BM:  7/12 per RN documentation  Height:  Ht Readings from Last 1 Encounters:  03/27/21 _0  (1.753 m)  Weight:  Wt Readings from Last 1 Encounters:  03/26/21 75.3 kg   Ideal Body Weight:  72.7 kg  BMI:  Body mass index is 24.51 kg/m.  Estimated Nutritional Needs:  Kcal:  2100-2300 kcal/d Protein:  105-120 g/d Fluid:  >2.1 L/d  Ranell Patrick, RD, LDN Clinical Dietitian Pager on [place]

## 2021-03-27 NOTE — Progress Notes (Addendum)
Mobility Specialist - Progress Note   03/27/21 1200  Mobility  Activity Transferred:  Chair to bed  Level of Assistance Minimal assist, patient does 75% or more  Assistive Device None  Distance Ambulated (ft) 2 ft  Mobility Response Tolerated well  Mobility performed by Mobility specialist  $Mobility charge 1 Mobility    Pt arrived back to unit. Mobility in to assist pt with transfer to bed d/t bilateral pain. MinA to stand, RN in to assist with transfer. SPT to return to bed. No LOB. Further activity deferred at this time d/t pain. Alarm set.    Kathee Delton Mobility Specialist 03/27/21, 12:38 PM

## 2021-03-27 NOTE — TOC Initial Note (Signed)
Transition of Care [facility]) - Initial/Assessment Note    Patient Details  Name: Jack [name] MRN: [id number] Date of Birth: [birth year]  Transition of [facility]) CM/SW Contact:    Shelbie Hutching, RN Phone Number: 03/27/2021, 12:58 PM  Clinical Narrative:                 Patient admitted to the hospital with multiple myloma and uncontrolled pain, patient started chemotherapy today in the hospital.  RNCM met with patient at the bedside.  Patient eating lunch and reports he is glad to have an appetite.  Patient is from home with his wife, he requires assistance at home from his wife due to his pain.  He walks with a walker.  Patient is open with [facility] for home health services and wants to go home at discharge and continue home health services.  He does not want to go to rehab, he has been a couple times in the past once to [facility] and then again to [facility]. TOC will cont follow and assist with discharge.  Corene Cornea with [facility] notified of admission to the hospital.    Expected Discharge Plan: [place] Barriers to Discharge: Continued Medical Work up   Patient Goals and CMS Choice Patient states their goals for this hospitalization and ongoing recovery are:: Patient wants to get his pain under control and get home CMS Medicare.gov Compare Post Acute Care list provided to:: Patient Choice offered to / list presented to : Patient  Expected Discharge Plan and Services Expected Discharge Plan: [place]   Discharge Planning Services: CM Consult Post Acute Care Choice: Resumption of Svcs/PTA Provider, Home Health Living arrangements for the past 2 months: Single Family Home (Condo)                 DME Arranged: N/A DME Agency: NA       HH Arranged: RN, PT, OT [place] Agency: [facility] ([place]) Date HH Agency Contacted: 03/27/21 Time HH Agency Contacted: 56 Representative spoke with at [place]: [name]  Prior  Living Arrangements/Services Living arrangements for the past 2 months: [place] (Condo) Lives with:: Spouse Patient language and need for interpreter reviewed:: Yes Do you feel safe going back to the place where you live?: Yes      Need for Family Participation in Patient Care: Yes (Comment) Care giver support system in place?: Yes (comment) Current home services: DME (walker) Criminal Activity/Legal Involvement Pertinent to Current Situation/Hospitalization: No - Comment as needed  Activities of Daily Living Home Assistive Devices/Equipment: Walker (specify type) ADL Screening (condition at time of admission) Patient's cognitive ability adequate to safely complete daily activities?: Yes Is the patient deaf or have difficulty hearing?: No Does the patient have difficulty seeing, even when wearing glasses/contacts?: No Does the patient have difficulty concentrating, remembering, or making decisions?: No Patient able to express need for assistance with ADLs?: Yes Does the patient have difficulty dressing or bathing?: Yes Independently performs ADLs?: No Communication: Independent Dressing (OT): Needs assistance Is this a change from baseline?: Pre-admission baseline Grooming: Independent Feeding: Independent Bathing: Needs assistance Is this a change from baseline?: Pre-admission baseline Toileting: Needs assistance Is this a change from baseline?: Pre-admission baseline Walks in Home: Needs assistance Is this a change from baseline?: Pre-admission baseline Does the patient have difficulty walking or climbing stairs?: Yes Weakness of Legs: Both Weakness of Arms/Hands: Both  Permission Sought/Granted Permission sought to share information with :  Case Manager, Family Supports, Other (comment) Permission granted to share information with : Yes, Verbal Permission Granted  Share Information with NAME: [name]  Permission granted to share info w AGENCY: [facility]  Permission  granted to share info w Relationship: wife     Emotional Assessment Appearance:: Appears stated age Attitude/Demeanor/Rapport: Engaged Affect (typically observed): Accepting Orientation: : Oriented to Self, Oriented to Place, Oriented to  Time, Oriented to Situation Alcohol / Substance Use: Not Applicable Psych Involvement: No (comment)  Admission diagnosis:  Multiple myeloma not having achieved remission (HCC) [C90.00] Pain of upper abdomen [R10.10] Acute hypoxemic respiratory failure (HCC) [J96.01] Patient Active Problem List   Diagnosis Date Noted   Acute hypoxemic respiratory failure ([place]) 03/26/2021   Right flank pain 03/26/2021   Multiple pathological fractures 03/14/2021   Kappa light chain myeloma (HCC)    Back pain 03/11/2021   Atrial fibrillation ([place]) 03/11/2021   Compression fracture of L2 and L3 03/11/2021   Hyperlipidemia    CKD (chronic kidney disease), stage IV (HCC)    Constipation    Palliative care encounter    Actinic keratosis 02/19/2021   Inguinal hernia 02/19/2021   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   Numbness of foot 02/19/2021   Sciatica 02/19/2021   Shoulder pain 02/19/2021   Palliative care patient 02/17/2021   Closed compression fracture of body of L1 vertebra ([place]) 02/07/2021   Protein-calorie malnutrition, severe 01/31/2021   Hypokalemia    Normocytic anemia    Atherosclerosis of aorta (HCC)    Irregular heart rhythm    T12 compression fracture ([place]) 01/02/2021   History of kidney stones    Acute kidney injury superimposed on CKD ([place])    Ureteral stone    Ureteral perforation secondary to stent manipulation 10/16/2017   Abdominal pain 10/16/2017   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/26/2016   Hyperplasia, prostate 02/03/2015   Multinodular goiter 07/03/2014   Thyroid nodule 06/20/2014   Nontoxic uninodular goiter 06/20/2014   Edema 09/24/2013   Encounter for preventive health examination 08/21/2013   Personal  history of colonic polyps 08/21/2013   History of venomous snake bite 08/21/2013   Cough 10/02/2012   Prostate hypertrophy    Elevated prostate specific antigen (PSA)    HYPERTRIGLYCERIDEMIA 12/21/2006   Essential hypertension 12/21/2006   GERD 12/21/2006   CALCULUS, KIDNEY 12/21/2006   PCP:  [name], MD Pharmacy:   [id number] [address]Phone: [phone]: [phone] [facility] [address]Phone: [phone]: [phone]    Social Determinants of Health (SDOH) Interventions    Readmission Risk Interventions No flowsheet data found.

## 2021-03-28 ENCOUNTER — Inpatient Hospital Stay: Payer: PPO

## 2021-03-28 DIAGNOSIS — E785 Hyperlipidemia, unspecified: Secondary | ICD-10-CM

## 2021-03-28 DIAGNOSIS — J9601 Acute respiratory failure with hypoxia: Secondary | ICD-10-CM | POA: Diagnosis not present

## 2021-03-28 DIAGNOSIS — C9000 Multiple myeloma not having achieved remission: Principal | ICD-10-CM

## 2021-03-28 DIAGNOSIS — K5900 Constipation, unspecified: Secondary | ICD-10-CM | POA: Diagnosis not present

## 2021-03-28 MED ORDER — GADOBUTROL 1 MMOL/ML IV SOLN
7.0000 mL | Freq: Once | INTRAVENOUS | Status: AC | PRN
  Administered 2021-03-28: 7 mL via INTRAVENOUS

## 2021-03-28 MED ORDER — MORPHINE SULFATE (PF) 2 MG/ML IV SOLN
1.0000 mg | INTRAVENOUS | Status: DC | PRN
  Administered 2021-03-29 – 2021-04-03 (×5): 1 mg via INTRAVENOUS
  Filled 2021-03-28 (×6): qty 1

## 2021-03-28 MED ORDER — OXYCODONE-ACETAMINOPHEN 5-325 MG PO TABS
1.0000 | ORAL_TABLET | ORAL | Status: DC | PRN
  Administered 2021-03-28 – 2021-03-29 (×7): 2 via ORAL
  Administered 2021-03-30: 1 via ORAL
  Administered 2021-03-31 – 2021-04-03 (×5): 2 via ORAL
  Administered 2021-04-06: 1 via ORAL
  Administered 2021-04-07: 2 via ORAL
  Filled 2021-03-28 (×10): qty 2
  Filled 2021-03-28: qty 1
  Filled 2021-03-28 (×5): qty 2

## 2021-03-28 NOTE — Progress Notes (Signed)
[facility] NAME: Roxie Gueye    MR#:  [id number]  DATE OF BIRTH:  12-10-1932  SUBJECTIVE:   patient came in with ongoing acute on severe right flank pain.  Patient feels a whole lot better. Tells me his pain is under good control and is eager to work with physical therapy. He slept well. No family in the room REVIEW OF SYSTEMS:   Review of Systems  Constitutional:  Negative for chills, fever and weight loss.  HENT:  Negative for ear discharge, ear pain and nosebleeds.   Eyes:  Negative for blurred vision, pain and discharge.  Respiratory:  Negative for sputum production, shortness of breath, wheezing and stridor.   Cardiovascular:  Negative for chest pain, palpitations, orthopnea and PND.  Gastrointestinal:  Positive for abdominal pain. Negative for diarrhea, nausea and vomiting.  Genitourinary:  Negative for frequency and urgency.  Musculoskeletal:  Positive for back pain. Negative for joint pain.  Neurological:  Positive for weakness. Negative for sensory change, speech change and focal weakness.  Psychiatric/Behavioral:  Negative for depression and hallucinations. The patient is not nervous/anxious.   Tolerating Diet:yes Tolerating PT: HHPT  DRUG ALLERGIES:   Allergies  Allergen Reactions  . Quinolones     Aortic dilation contraindicates FQ  . Amlodipine Other (See Comments)    LE edema  . Azithromycin Nausea And Vomiting  . Lisinopril Cough  . Morphine And Related Nausea And Vomiting    VITALS:  Blood pressure (!) 173/98, pulse 65, temperature 97.9 F (36.6 C), resp. rate 20, height '5\' 9"'  (1.753 m), weight 75.3 kg, SpO2 98 %.  PHYSICAL EXAMINATION:   Physical Exam  GENERAL:  87 y.o.-year-old patient lying in the bed with acute distress due to pain  LUNGS: Normal breath sounds bilaterally, no wheezing, rales, rhonchi. No use of accessory muscles of respiration.  CARDIOVASCULAR: S1, S2 normal. No murmurs,  rubs, or gallops.  ABDOMEN: Soft, mild right flank tender, nondistended. Bowel sounds present. No organomegaly or mass.  EXTREMITIES: No cyanosis, clubbing or edema b/l.    NEUROLOGIC: non focal PSYCHIATRIC:  patient is alert and oriented x 3.  SKIN: No obvious rash, lesion, or ulcer.   LABORATORY PANEL:  CBC Recent Labs  Lab 03/27/21 0646  WBC 4.8  HGB 10.5*  HCT 32.5*  PLT 212     Chemistries  Recent Labs  Lab 03/26/21 1549 03/27/21 0646  NA 135 134*  K 4.2 3.5  CL 100 102  CO2 22 27  GLUCOSE 98 90  BUN 29* 23  CREATININE 1.15 0.94  CALCIUM 9.2 8.3*  AST 41  --   ALT 25  --   ALKPHOS 104  --   BILITOT 1.4*  --     Cardiac Enzymes No results for input(s): TROPONINI in the last 168 hours. RADIOLOGY:  MR Lumbar Spine W Wo Contrast  Result Date: 03/27/2021 CLINICAL DATA:  Low back pain, history of multiple myeloma EXAM: MRI LUMBAR SPINE WITHOUT AND WITH CONTRAST TECHNIQUE: Multiplanar and multiecho pulse sequences of the lumbar spine were obtained without and with intravenous contrast. CONTRAST:  7.46m GADAVIST GADOBUTROL 1 MMOL/ML IV SOLN COMPARISON:  01/29/2021 FINDINGS: Segmentation:  Standard. Alignment:  No significant listhesis. Vertebrae: There are pathologic compression fractures at T12-L4. Progressed since the prior MRI but present on other interval imaging with exception of L4. Cement augmentation is present at T12-L3. Minor endplate retropulsion, greatest at T12 and L1. There is mildly heterogeneous marrow  signal with patchy STIR hyperintensity and enhancement. New mild ventral epidural soft tissue extension is present at L1 to L2-L3. Conus medullaris and cauda equina: Conus extends to the L1-L2 level. Conus and cauda equina appear normal. No abnormal intrathecal enhancement. Paraspinal and other soft tissues: Unremarkable. Disc levels: L1-L2:  No significant canal or foraminal stenosis. L2-L3: Disc bulge. Facet arthropathy with ligamentum flavum infolding. No  significant canal or foraminal stenosis. L3-L4: Disc bulge. Facet arthropathy with ligamentum flavum infolding. No significant canal or foraminal stenosis. L4-L5: Disc bulge. Facet arthropathy with ligamentum flavum infolding. No significant canal or foraminal stenosis. L5-S1:  No significant canal or foraminal stenosis. IMPRESSION: Pathologic compression fractures of T12-L4 with cement augmentation at T12-L3. Heterogeneous marrow signal a and enhancement likely reflecting known myeloma. There is new mild ventral epidural soft tissue extension at L1 to L2-L3. No significant canal or foraminal narrowing at any level. Electronically Signed   By: Macy Mis M.D.   On: 03/27/2021 14:39   CT ABDOMEN PELVIS W CONTRAST  Result Date: 03/26/2021 CLINICAL DATA:  Abdominal distension and abdominal pain EXAM: CT ABDOMEN AND PELVIS WITH CONTRAST TECHNIQUE: Multidetector CT imaging of the abdomen and pelvis was performed using the standard protocol following bolus administration of intravenous contrast. CONTRAST:  171m OMNIPAQUE IOHEXOL 350 MG/ML SOLN COMPARISON:  03/11/2021 FINDINGS: Lower chest: Unremarkable. Hepatobiliary: No suspicious focal abnormality within the liver parenchyma. There is no evidence for gallstones, gallbladder wall thickening, or pericholecystic fluid. No intrahepatic or extrahepatic biliary dilation. Pancreas: No focal mass lesion. No dilatation of the main duct. No intraparenchymal cyst. No peripancreatic edema. Spleen: No splenomegaly. No focal mass lesion. Adrenals/Urinary Tract: No adrenal nodule or mass. Kidneys unremarkable. No evidence for hydroureter. The urinary bladder appears normal for the degree of distention. Stomach/Bowel: Tiny hiatal hernia. Stomach otherwise unremarkable. Duodenum is normally positioned as is the ligament of Treitz. No small bowel wall thickening. No small bowel dilatation. The terminal ileum is normal. The appendix is normal. No gross colonic mass. No colonic  wall thickening. No substantial diverticular disease in the left colon. Colon is diffusely fluid-filled with fluid visible in the rectum, imaging findings that could be consistent diarrhea. No colonic wall thickening. Vascular/Lymphatic: There is moderate atherosclerotic calcification of the abdominal aorta without aneurysm. There is no gastrohepatic or hepatoduodenal ligament lymphadenopathy. No retroperitoneal or mesenteric lymphadenopathy. No pelvic sidewall lymphadenopathy. Reproductive: Prostate gland is markedly enlarged. Other: No intraperitoneal free fluid. Musculoskeletal: Mesh in the left groin region is compatible with prior herniorrhaphy. Bony mineralization is diffusely heterogeneous in this patient with a history of multiple myeloma. Patient is status post multiple level vertebral augmentation in the lower thoracic and lumbar spine. IMPRESSION: 1. No acute findings in the abdomen or pelvis. 2. Fluid in the right and transverse colon with visible fluid in the rectum. Imaging findings could be consistent with diarrhea. No evidence for colonic wall thickening to suggest colitis. 3. Marked prostatomegaly. 4. Tiny hiatal hernia. 5. Aortic Atherosclerosis (ICD10-I70.0). Electronically Signed   By: EMisty StanleyM.D.   On: 03/26/2021 18:09   DG Chest Portable 1 View  Result Date: 03/26/2021 CLINICAL DATA:  87year old male with right flank pain and rib fracture. EXAM: PORTABLE CHEST 1 VIEW COMPARISON:  Chest radiograph dated 02/02/2021. FINDINGS: No focal consolidation, pleural effusion, or pneumothorax. Apparent two small adjacent calcified nodules in the left upper lobe with combined dimension of 9 mm. Attention on follow-up imaging recommended. The cardiac silhouette is within limits. No acute osseous pathology. IMPRESSION: 1. No  acute cardiopulmonary process. 2. A 9 mm left upper lobe calcified nodule. Electronically Signed   By: Anner Crete M.D.   On: 03/26/2021 19:08   ASSESSMENT AND PLAN:    BARETT WHIDBEE is a 87 y.o. male with medical history significant for paroxysmal atrial fibrillation, PACs, coronary calcium and aortic atherosclerosis, diastolic dysfunction, P5KD, GERD, aortic root dilatation, ascending aortic dilatation, hypertension, GERD, hypertriglyceridemia, CKD stage IIIb, BPH, multiple myeloma, prior tobacco use, presents to the emergency department from oncology clinic for persistent left flank pain.  Severe back pain radiating to the rightflank suspected due to pathology compression fracture T12--L4 Radiculopathy -- IV morphine prn, PRN tramadol, OxyContin BID, percocet prn -- gabapentin increase dose to 300 mg TID,continue baclofen -- patient has history of compression fractures in the past underwent kyphoplasty x2 (per old record)  multiple myeloma with pathology compression fracture -- patient seen by Dr. Rogue Bussing -- started on high dose prednisone and Valtrex  chronic constipation -- continue bowel prep  Hypertension -- continue Coreg  history of a fib -- continue Coreg and eliquis   Nutrition Status: Nutrition Problem: Severe Malnutrition (in the context of chronic illness) Etiology: decreased appetite Signs/Symptoms: severe muscle depletion, severe fat depletion, percent weight loss (17% weight loss x 3 months) Percent weight loss: 17 % Interventions: Ensure Enlive (each supplement provides 350kcal and 20 grams of protein), MVI, Liberalize Diet  PT to see pt  Family communication :Dr B has spoken with pt's wife today Consults : oncology CODE STATUS: full DVT Prophylaxis : eliquis Level of care: Med-Surg Status is: Inpatient  Remains inpatient appropriate because:Ongoing active pain requiring inpatient pain management  Dispo: The patient is from: Home              Anticipated d/c is to: Home              Patient currently is not medically stable to d/c.   Difficult to place patient No        TOTAL TIME TAKING CARE OF THIS PATIENT:  25 minutes.  >50% time spent on counselling and coordination of care  Note: This dictation was prepared with Dragon dictation along with smaller phrase technology. Any transcriptional errors that result from this process are unintentional.  Fritzi Mandes M.D    [facility]   CC: Primary care physician; [name], MD Patient ID: [name], male   DOB: 03-16-1933, 69 y.o.   MRN: [id number]

## 2021-03-28 NOTE — Evaluation (Signed)
Physical Therapy Evaluation Patient Details Name: Jack [name] MRN: [id number] DOB: December 21, 1932 Today's Date: 03/28/2021   History of Present Illness  Pt admitted 03/26/21 for multiple myloma and uncontrolled pain; pt starte chemotherapy on this date in the hospital. Pt with recent hospitalization 2 weeks ago for L2-L3 kyphoplasty and wears TLSO for OOB mobility. PMH includes:  gastric reflux, AFIB, hypertension, hyperlipidemia, recent T12/L1 kyphoplasty, and multiple myeloma.   Clinical Impression  Pt is a 87 year old M admitted to hospital on 03/26/21 for uncontrolled pain due to multiple myeloma. At baseline, pt was mod I with dressing, toileting, grooming, and using rollator for [facility] mobility; required assist to don TLSO, for showering, and for IADL's. Pt presents with functional weakness, increased pain levels, limited lumbar AROM, decreased activity tolerance, and decreased balance, resulting in impaired functional mobility. Due to deficits, pt required supervision for bed mobility, mod assist for transfers, and min guard for safety with gait. Pt required set-up assist for donning TLSO and was able to teach back TLSO management and log roll for pain management. Deficits limit the pt's ability to safely and independently perform ADL's, transfer, and ambulate. Pt will benefit from acute skilled PT services to address deficits for return to baseline function. Pt appropriate to ambulate with nursing with RW, while hospitalized, for maintenance of independence with mobility. Pt functional mobility and safety expected to improve with pain management/control. Therefore, PT recommends DC home with HHPT and 24/7 supervision/assist. Pt agreeable.     Follow Up Recommendations Home health PT;Supervision/Assistance - 24 hour    Equipment Recommendations  None recommended by PT       Precautions / Restrictions Precautions Precautions: Fall Required Braces or Orthoses: Spinal Brace Spinal Brace:  Thoracolumbosacral orthotic Restrictions Weight Bearing Restrictions: No      Mobility  Bed Mobility Overal bed mobility: Needs Assistance Bed Mobility: Rolling;Sidelying to Sit Rolling: Supervision Sidelying to sit: Supervision       General bed mobility comments: Supervision for safety to sit EOB via log roll technique; increased time/effort secondary to pain    Transfers Overall transfer level: Needs assistance   Transfers: Sit to/from Stand Sit to Stand: Mod assist         General transfer comment: Mod assist for power to stand from EOB with RW. Verbal cues for hand placement and sequencing. Increased time/effort to achieve full standing secondary to pain.  Ambulation/Gait Ambulation/Gait assistance: Min guard Gait Distance (Feet): 190 Feet Assistive device: Rolling walker (2 wheeled)   Gait velocity: WNL   General Gait Details: Min guard for safety to ambulate with RW. Demonstrates early reciprocal gait, decreased step length/foot clearance bil, and increased UE reliance on RW.     Balance Overall balance assessment: Needs assistance Sitting-balance support: Feet supported;Single extremity supported Sitting balance-Leahy Scale: Fair Sitting balance - Comments: due to pain   Standing balance support: Bilateral upper extremity supported;During functional activity Standing balance-Leahy Scale: Fair Standing balance comment: in RW due to pain           Pertinent Vitals/Pain Pain Assessment: 0-10 Pain Score: 6  Pain Location: back Pain Intervention(s): Limited activity within patient's tolerance;Monitored during session;Premedicated before session;Repositioned    Home Living Family/patient expects to be discharged to:: Private residence Living Arrangements: Spouse/significant other Available Help at Discharge: Family;Available 24 hours/day Type of Home: House Home Access: Stairs to enter Entrance Stairs-Rails: None Entrance Stairs-Number of Steps:  1 Home Layout: One level Home Equipment: Cane - single point;Shower seat;Grab bars - tub/shower;Hand  held shower head;Toilet riser;Walker - 4 wheels;Walker - 2 wheels Additional Comments: lift chair/recliner    Prior Function Level of Independence: Independent with assistive device(s)         Comments: Since last hospital visit, pt notes that he was Ind with ADL's, but required assist for showering and IADL's due to increased pain levels. Used rollator for household ambulation. Needs assist to don brace. Hasn't drove since March 2022.     Hand Dominance   Dominant Hand: Right    Extremity/Trunk Assessment   Upper Extremity Assessment Upper Extremity Assessment: Generalized weakness    Lower Extremity Assessment Lower Extremity Assessment: Generalized weakness (not formally assessed due to increased pain levels with mobility; observed to be at least 3+/5 as he was able to WB through BLE for transfers/gait. Functional weakness noted, as he required mod assist for power to stand from EOB.)    Cervical / Trunk Assessment Cervical / Trunk Assessment: Kyphotic  Communication   Communication: No difficulties  Cognition Arousal/Alertness: Awake/alert Behavior During Therapy: WFL for tasks assessed/performed Overall Cognitive Status: Within Functional Limits for tasks assessed         General Comments: A&O x4, talkative, able to follow 100% of multistep commands      General Comments General comments (skin integrity, edema, etc.): RN present at beginning of session to administer pain medication; set up assist with donning TLSO due to increased pain    Exercises Other Exercises Other Exercises: Pt able to participate in bed mobility, transfers, and gait with RW. Required grossly supervision-mod assist. Increased assist required for STS due to pain. Other Exercises: Pt educated regarding: PT role/POC, DC recommendations, log roll for pain management, activity modification/pacing  due to pain, safety with mobility, TLSO management. He verbalized understanding.   Assessment/Plan    PT Assessment Patient needs continued PT services  PT Problem List Decreased strength;Decreased mobility;Decreased activity tolerance;Decreased balance;Pain;Decreased range of motion       PT Treatment Interventions DME instruction;Therapeutic exercise;Gait training;Balance training;Stair training;Neuromuscular re-education;Functional mobility training;Therapeutic activities;Patient/family education    PT Goals (Current goals can be found in the Care Plan section)  Acute Rehab PT Goals Patient Stated Goal: to go home PT Goal Formulation: With patient Time For Goal Achievement: 04/11/21 Potential to Achieve Goals: Good    Frequency Min 2X/week    AM-PAC PT "6 Clicks" Mobility  Outcome Measure Help needed turning from your back to your side while in a flat bed without using bedrails?: A Little Help needed moving from lying on your back to sitting on the side of a flat bed without using bedrails?: A Little Help needed moving to and from a bed to a chair (including a wheelchair)?: A Little Help needed standing up from a chair using your arms (e.g., wheelchair or bedside chair)?: A Lot Help needed to walk in hospital room?: A Little Help needed climbing 3-5 steps with a railing? : A Lot 6 Click Score: 16    End of Session Equipment Utilized During Treatment: Gait belt Activity Tolerance: Patient tolerated treatment well Patient left: with call bell/phone within reach;in chair;with chair alarm set Nurse Communication: Mobility status PT Visit Diagnosis: Other abnormalities of gait and mobility (R26.89);Muscle weakness (generalized) (M62.81);Difficulty in walking, not elsewhere classified (R26.2);Pain;Unsteadiness on feet (R26.81) Pain - Right/Left:  (back)    Time: 0258-5277 PT Time Calculation (min) (ACUTE ONLY): 26 min   Charges:   PT Evaluation $PT Eval Low Complexity: 1  Low PT Treatments $Therapeutic Activity: 8-22 mins  Herminio Commons, PT, DPT 2:56 PM,03/28/21

## 2021-03-28 NOTE — Progress Notes (Signed)
Jack [name]   DOB:Feb 06, 1933   EP#:[id number]    Subjective: Patient notes to have significant improvement of the pain overnight.  Was able to sleep fairly well.  Denies any nausea vomiting abdominal pain.  He is able to move his legs.  Objective:  Vitals:   03/28/21 0747 03/28/21 1531  BP: (!) 173/98 109/72  Pulse: 65 67  Resp:    Temp: 97.9 F (36.6 C) 97.9 F (36.6 C)  SpO2: 98% 94%     Intake/Output Summary (Last 24 hours) at 03/28/2021 1724 Last data filed at 03/28/2021 1300 Gross per 24 hour  Intake 538 ml  Output 950 ml  Net -412 ml    Physical Exam Vitals and nursing note reviewed.  Constitutional:      Comments: Patient resting in the bed.    Alone   HENT:     Head: Normocephalic and atraumatic.     Mouth/Throat:     Mouth: Mucous membranes are moist.     Pharynx: No oropharyngeal exudate.  Eyes:     Pupils: Pupils are equal, round, and reactive to light.  Cardiovascular:     Rate and Rhythm: Normal rate and regular rhythm.  Pulmonary:     Effort: No respiratory distress.     Breath sounds: No wheezing.     Comments: Decreased breath sounds bilaterally at bases.  No wheeze or crackles Abdominal:     General: Bowel sounds are normal. There is no distension.     Palpations: Abdomen is soft. There is no mass.     Tenderness: There is no abdominal tenderness. There is no guarding or rebound.     Comments: Right flank "mass"-noted however no significant tenderness.  Musculoskeletal:        General: No tenderness. Normal range of motion.     Cervical back: Normal range of motion and neck supple.  Skin:    General: Skin is warm.  Neurological:     Mental Status: He is alert and oriented to person, place, and time.  Psychiatric:        Mood and Affect: Affect normal.        Judgment: Judgment normal.     Labs:  Lab Results  Component Value Date   WBC 4.8 03/27/2021   HGB 10.5 (L) 03/27/2021   HCT 32.5 (L) 03/27/2021   MCV 96.4 03/27/2021   PLT 212  03/27/2021   NEUTROABS 5.2 03/26/2021    Lab Results  Component Value Date   NA 134 (L) 03/27/2021   K 3.5 03/27/2021   CL 102 03/27/2021   CO2 27 03/27/2021    Studies:  MR Lumbar Spine W Wo Contrast  Result Date: 03/27/2021 CLINICAL DATA:  Low back pain, history of multiple myeloma EXAM: MRI LUMBAR SPINE WITHOUT AND WITH CONTRAST TECHNIQUE: Multiplanar and multiecho pulse sequences of the lumbar spine were obtained without and with intravenous contrast. CONTRAST:  7.53m GADAVIST GADOBUTROL 1 MMOL/ML IV SOLN COMPARISON:  01/29/2021 FINDINGS: Segmentation:  Standard. Alignment:  No significant listhesis. Vertebrae: There are pathologic compression fractures at T12-L4. Progressed since the prior MRI but present on other interval imaging with exception of L4. Cement augmentation is present at T12-L3. Minor endplate retropulsion, greatest at T12 and L1. There is mildly heterogeneous marrow signal with patchy STIR hyperintensity and enhancement. New mild ventral epidural soft tissue extension is present at L1 to L2-L3. Conus medullaris and cauda equina: Conus extends to the L1-L2 level. Conus and cauda equina appear normal. No abnormal  intrathecal enhancement. Paraspinal and other soft tissues: Unremarkable. Disc levels: L1-L2:  No significant canal or foraminal stenosis. L2-L3: Disc bulge. Facet arthropathy with ligamentum flavum infolding. No significant canal or foraminal stenosis. L3-L4: Disc bulge. Facet arthropathy with ligamentum flavum infolding. No significant canal or foraminal stenosis. L4-L5: Disc bulge. Facet arthropathy with ligamentum flavum infolding. No significant canal or foraminal stenosis. L5-S1:  No significant canal or foraminal stenosis. IMPRESSION: Pathologic compression fractures of T12-L4 with cement augmentation at T12-L3. Heterogeneous marrow signal a and enhancement likely reflecting known myeloma. There is new mild ventral epidural soft tissue extension at L1 to L2-L3. No  significant canal or foraminal narrowing at any level. Electronically Signed   By: Macy Mis M.D.   On: 03/27/2021 14:39   DG Chest Portable 1 View  Result Date: 03/26/2021 CLINICAL DATA:  87 year old male with right flank pain and rib fracture. EXAM: PORTABLE CHEST 1 VIEW COMPARISON:  Chest radiograph dated 02/02/2021. FINDINGS: No focal consolidation, pleural effusion, or pneumothorax. Apparent two small adjacent calcified nodules in the left upper lobe with combined dimension of 9 mm. Attention on follow-up imaging recommended. The cardiac silhouette is within limits. No acute osseous pathology. IMPRESSION: 1. No acute cardiopulmonary process. 2. A 9 mm left upper lobe calcified nodule. Electronically Signed   By: Anner Crete M.D.   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  # Multiple myeloma new diagnosis-stage III -[11:14 translocation high risk] -/given symptomatic disease currently on pulse dose of steroids dexamethasone 40 mg daily for 2/4 days; also on Velcade subcu twice weekly.-Today cycle #1 day #2.  Patient due for cycle number day 4 on 7/18.   #Back pain/right flank pain-likely secondary to infiltration of the intercostal nerves by myeloma-continue IV narcotics; hopefully steroids-both anti-inflammatory/cytotoxic should help control the disease/and the pain.  Lumbar spine MRI shows enhancement of the ventral epidural soft tissue extension at L1-L2 L3.  I would recommend evaluation with thoracic lumbar spine with and without contrast.  We will consult radiation oncology Dr. Donella Stade for consideration of radiation for palliative reasons.  #Shingles prophylaxis/valacyclovir once a day  #DVT prophylaxis/GI prophylaxis-Eliquis 2.5 mg twice daily Protonix 40 mg once a day.  #I discussed above plan of care with the patient/his  wife Jack [name] over the phone.  They are in agreement.  Also discussed with Dr. Posey Pronto.   Cammie Sickle, MD 03/28/2021  5:24 PM

## 2021-03-29 DIAGNOSIS — R109 Unspecified abdominal pain: Secondary | ICD-10-CM | POA: Diagnosis not present

## 2021-03-29 DIAGNOSIS — S22000S Wedge compression fracture of unspecified thoracic vertebra, sequela: Secondary | ICD-10-CM

## 2021-03-29 DIAGNOSIS — M546 Pain in thoracic spine: Secondary | ICD-10-CM

## 2021-03-29 DIAGNOSIS — I10 Essential (primary) hypertension: Secondary | ICD-10-CM

## 2021-03-29 DIAGNOSIS — J9601 Acute respiratory failure with hypoxia: Secondary | ICD-10-CM | POA: Diagnosis not present

## 2021-03-29 DIAGNOSIS — S22000A Wedge compression fracture of unspecified thoracic vertebra, initial encounter for closed fracture: Secondary | ICD-10-CM

## 2021-03-29 DIAGNOSIS — K5900 Constipation, unspecified: Secondary | ICD-10-CM | POA: Diagnosis not present

## 2021-03-29 DIAGNOSIS — C9000 Multiple myeloma not having achieved remission: Secondary | ICD-10-CM | POA: Diagnosis not present

## 2021-03-29 MED ORDER — FLEET ENEMA 7-19 GM/118ML RE ENEM
1.0000 | ENEMA | Freq: Once | RECTAL | Status: AC
  Administered 2021-03-29: 1 via RECTAL

## 2021-03-29 MED ORDER — BISACODYL 10 MG RE SUPP
10.0000 mg | Freq: Every day | RECTAL | Status: DC | PRN
  Administered 2021-03-29: 10 mg via RECTAL
  Filled 2021-03-29: qty 1

## 2021-03-29 NOTE — Progress Notes (Signed)
[facility] [place] NAME: Jack [name]    MR#:  [id number]  DATE OF BIRTH:  Jul 11, 1933  SUBJECTIVE:   patient came in with ongoing acute on severe right flank pain.  Patient feels  better with IV and po pain meds  He slept well. No family in the room Worked with PT yday  Last BM 3 days ago--pt ok to try suppository REVIEW OF SYSTEMS:   Review of Systems  Constitutional:  Negative for chills, fever and weight loss.  HENT:  Negative for ear discharge, ear pain and nosebleeds.   Eyes:  Negative for blurred vision, pain and discharge.  Respiratory:  Negative for sputum production, shortness of breath, wheezing and stridor.   Cardiovascular:  Negative for chest pain, palpitations, orthopnea and PND.  Gastrointestinal:  Positive for abdominal pain and constipation. Negative for diarrhea, nausea and vomiting.  Genitourinary:  Negative for frequency and urgency.  Musculoskeletal:  Positive for back pain. Negative for joint pain.  Neurological:  Positive for weakness. Negative for sensory change, speech change and focal weakness.  Psychiatric/Behavioral:  Negative for depression and hallucinations. The patient is not nervous/anxious.   Tolerating Diet:yes Tolerating PT: HHPT  DRUG ALLERGIES:   Allergies  Allergen Reactions  . Quinolones     Aortic dilation contraindicates FQ  . Amlodipine Other (See Comments)    LE edema  . Azithromycin Nausea And Vomiting  . Lisinopril Cough  . Morphine And Related Nausea And Vomiting    VITALS:  Blood pressure (!) 166/76, pulse 61, temperature 97.8 F (36.6 C), resp. rate 20, height '5\' 9"'  (1.753 m), weight 75.3 kg, SpO2 90 %.  PHYSICAL EXAMINATION:   Physical Exam  GENERAL:  87 y.o.-year-old patient lying in the bed with acute distress due to pain  LUNGS: Normal breath sounds bilaterally, no wheezing, rales, rhonchi. No use of accessory muscles of respiration.  CARDIOVASCULAR: S1, S2  normal. No murmurs, rubs, or gallops.  ABDOMEN: Soft, mild right flank tender, nondistended. Bowel sounds present. No organomegaly or mass. Right flank bulge+ EXTREMITIES: No cyanosis, clubbing or edema b/l.    NEUROLOGIC: non focal. PSYCHIATRIC:  patient is alert and oriented x 3.  SKIN: No obvious rash, lesion, or ulcer.   LABORATORY PANEL:  CBC Recent Labs  Lab 03/27/21 0646  WBC 4.8  HGB 10.5*  HCT 32.5*  PLT 212     Chemistries  Recent Labs  Lab 03/26/21 1549 03/27/21 0646  NA 135 134*  K 4.2 3.5  CL 100 102  CO2 22 27  GLUCOSE 98 90  BUN 29* 23  CREATININE 1.15 0.94  CALCIUM 9.2 8.3*  AST 41  --   ALT 25  --   ALKPHOS 104  --   BILITOT 1.4*  --     Cardiac Enzymes No results for input(s): TROPONINI in the last 168 hours. RADIOLOGY:  MR THORACIC SPINE W WO CONTRAST  Result Date: 03/29/2021 CLINICAL DATA:  Initial evaluation for hematologic malignancy, staging. Multiple myeloma. EXAM: MRI THORACIC WITHOUT AND WITH CONTRAST TECHNIQUE: Multiplanar and multiecho pulse sequences of the thoracic spine were obtained without and with intravenous contrast. CONTRAST:  95m GADAVIST GADOBUTROL 1 MMOL/ML IV SOLN COMPARISON:  MRI of the lumbar spine from 03/27/2021 as well as previous MRI from 01/29/2021. FINDINGS: Alignment: Physiologic with preservation of the normal thoracic kyphosis. Vertebrae: Abnormal heterogeneous signal intensity seen throughout the visualized bone marrow, presumably related to history of multiple myeloma. There is new  abnormal STIR signal intensity involving the T10 and T11 vertebral bodies, new as compared to prior thoracic spine MRI from 01/29/2021, suggesting progressive disease. There is an associated pathologic compression fracture at the inferior endplate of P59 with no more than mild central height loss and trace 2 mm bony retropulsion, new from prior. The marrow edema at this level is likely at least in part related to the fracture. 1 cm STIR  hyperintense lesion involving the T8 vertebral body is stable. Additional subcentimeter STIR hyperintense lesions involving the T6 and T7 vertebral bodies also similar. T9 hemangioma noted. Additional compression deformities involving the T12 through L3 vertebral bodies with sequelae of prior vertebral augmentation again noted. Mild chronic compression deformities at the superior endplates of T3 and T4 are stable. Vertebral body height otherwise maintained. Cord: Normal signal and morphology. Previously identified soft tissue density within the ventral epidural space extending from L1 through L2-3 again noted, better characterized on prior MRI of the lumbar spine. No other extra osseous extension of tumor seen within the thoracic spine. Paraspinal and other soft tissues: Unremarkable. Disc levels: Mild for age multilevel disc desiccation seen throughout the thoracic spine. No significant disc bulge or focal disc herniation. No spinal stenosis. Foramina remain patent. IMPRESSION: 1. Abnormal heterogeneous signal intensity throughout the visualized bone marrow, consistent with history of multiple myeloma. Increased STIR signal intensity within the T10 and T11 vertebral bodies since prior thoracic spine MRI from 01/29/2021, suggesting progressive disease. 2. New pathologic compression fracture at the inferior endplate of F63 with no more than mild central height loss and trace 2 mm bony retropulsion. No stenosis. 3. Small amount of extra osseous tumor within the ventral epidural space at L1 through L2-3, better characterized on prior lumbar spine MRI. No other epidural or extraosseous tumor within the thoracic spine. 4. No significant canal or foraminal stenosis. Electronically Signed   By: Jeannine Boga M.D.   On: 03/29/2021 04:31   MR Lumbar Spine W Wo Contrast  Result Date: 03/27/2021 CLINICAL DATA:  Low back pain, history of multiple myeloma EXAM: MRI LUMBAR SPINE WITHOUT AND WITH CONTRAST TECHNIQUE:  Multiplanar and multiecho pulse sequences of the lumbar spine were obtained without and with intravenous contrast. CONTRAST:  7.76m GADAVIST GADOBUTROL 1 MMOL/ML IV SOLN COMPARISON:  01/29/2021 FINDINGS: Segmentation:  Standard. Alignment:  No significant listhesis. Vertebrae: There are pathologic compression fractures at T12-L4. Progressed since the prior MRI but present on other interval imaging with exception of L4. Cement augmentation is present at T12-L3. Minor endplate retropulsion, greatest at T12 and L1. There is mildly heterogeneous marrow signal with patchy STIR hyperintensity and enhancement. New mild ventral epidural soft tissue extension is present at L1 to L2-L3. Conus medullaris and cauda equina: Conus extends to the L1-L2 level. Conus and cauda equina appear normal. No abnormal intrathecal enhancement. Paraspinal and other soft tissues: Unremarkable. Disc levels: L1-L2:  No significant canal or foraminal stenosis. L2-L3: Disc bulge. Facet arthropathy with ligamentum flavum infolding. No significant canal or foraminal stenosis. L3-L4: Disc bulge. Facet arthropathy with ligamentum flavum infolding. No significant canal or foraminal stenosis. L4-L5: Disc bulge. Facet arthropathy with ligamentum flavum infolding. No significant canal or foraminal stenosis. L5-S1:  No significant canal or foraminal stenosis. IMPRESSION: Pathologic compression fractures of T12-L4 with cement augmentation at T12-L3. Heterogeneous marrow signal a and enhancement likely reflecting known myeloma. There is new mild ventral epidural soft tissue extension at L1 to L2-L3. No significant canal or foraminal narrowing at any level. Electronically Signed  By: Macy Mis M.D.   On: 03/27/2021 14:39   ASSESSMENT AND PLAN:   TWAIN STENSETH is a 87 y.o. male with medical history significant for paroxysmal atrial fibrillation, PACs, coronary calcium and aortic atherosclerosis, diastolic dysfunction, V7SM, GERD, aortic root  dilatation, ascending aortic dilatation, hypertension, GERD, hypertriglyceridemia, CKD stage IIIb, BPH, multiple myeloma, prior tobacco use, presents to the emergency department from oncology clinic for persistent left flank pain.  Severe back pain radiating to the rightflank suspected due to pathology compression fracture T12--L4 Radiculopathy -- IV morphine prn, PRN tramadol, OxyContin BID, percocet prn -- gabapentin increase dose to 300 mg TID,continue baclofen -- patient has history of compression fractures in the past underwent kyphoplasty x2 (per old record) --MRI Thoracic and lumbar spine-- Abnormal heterogeneous signal intensity throughout the visualized bone marrow, consistent with history of multiple myeloma. Increased STIR signal intensity within the T10 and T11 vertebral bodies since prior thoracic spine MRI from 01/29/2021, suggesting progressive disease. 2. New pathologic compression fracture at the inferior endplate of O70 with no more than mild central height loss and trace 2 mm bony retropulsion. No stenosis. 3. Small amount of extra osseous tumor within the ventral epidural space at L1 through L2-3, better characterized on prior lumbar spine MRI. No other epidural or extraosseous tumor within the thoracic spine. 4. No significant canal or foraminal stenosis. multiple myeloma with pathology compression fracture -- patient seen by Dr. Rogue Bussing -- started on high dose prednisone and Valtrex  chronic constipation -- continue bowel prep  Hypertension -- continue Coreg  history of a fib -- continue Coreg and eliquis   Nutrition Status: Nutrition Problem: Severe Malnutrition (in the context of chronic illness) Etiology: decreased appetite Signs/Symptoms: severe muscle depletion, severe fat depletion, percent weight loss (17% weight loss x 3 months) Percent weight loss: 17 % Interventions: Ensure Enlive (each supplement provides 350kcal and 20 grams of protein), MVI,  Liberalize Diet  PT  recommends HHPT  Family communication :Dr B has spoken with pt's wife  Consults : oncology CODE STATUS: full DVT Prophylaxis : eliquis Level of care: Med-Surg Status is: Inpatient  Remains inpatient appropriate because:Ongoing active pain requiring inpatient pain management  Dispo: The patient is from: Home              Anticipated d/c is to: Home              Patient currently is not medically stable to d/c.   Difficult to place patient No  Will cont with pain meds and rx of myeloma.       TOTAL TIME TAKING CARE OF THIS PATIENT: 25 minutes.  >50% time spent on counselling and coordination of care  Note: This dictation was prepared with Dragon dictation along with smaller phrase technology. Any transcriptional errors that result from this process are unintentional.  Fritzi Mandes M.D    [facility]   CC: Primary care physician; [name], MD Patient ID: [name], male   DOB: 11-20-1932, 68 y.o.   MRN: [id number]

## 2021-03-29 NOTE — Progress Notes (Signed)
Jack [name]   DOB:1933-05-26   LX#:[id number]    Subjective: Patient notes to have significant improvement of the pain overnight-however still continues to have pain.  with movement.  Pain mostly in the back.  Still considerable constipation.  Has not had bowel movement in the last 3 days. Objective:  Vitals:   03/29/21 1526 03/29/21 1921  BP: 122/90 (!) 158/74  Pulse: (!) 50 63  Resp: 18 (!) 21  Temp:  (!) 97.4 F (36.3 C)  SpO2: 94% 100%     Intake/Output Summary (Last 24 hours) at 03/29/2021 2102 Last data filed at 03/29/2021 1300 Gross per 24 hour  Intake 600 ml  Output 400 ml  Net 200 ml    Physical Exam Vitals and nursing note reviewed.  Constitutional:      Comments: Patient resting in the bed.  Accompanied by his son.   HENT:     Head: Normocephalic and atraumatic.     Mouth/Throat:     Mouth: Mucous membranes are moist.     Pharynx: No oropharyngeal exudate.  Eyes:     Pupils: Pupils are equal, round, and reactive to light.  Cardiovascular:     Rate and Rhythm: Normal rate and regular rhythm.  Pulmonary:     Effort: No respiratory distress.     Breath sounds: No wheezing.     Comments: Decreased breath sounds bilaterally at bases.  No wheeze or crackles Abdominal:     General: Bowel sounds are normal. There is no distension.     Palpations: Abdomen is soft. There is no mass.     Tenderness: There is no abdominal tenderness. There is no guarding or rebound.     Comments: Right flank "mass"-noted however no significant tenderness.  Musculoskeletal:        General: No tenderness. Normal range of motion.     Cervical back: Normal range of motion and neck supple.  Skin:    General: Skin is warm.  Neurological:     Mental Status: He is alert and oriented to person, place, and time.  Psychiatric:        Mood and Affect: Affect normal.        Judgment: Judgment normal.     Labs:  Lab Results  Component Value Date   WBC 4.8 03/27/2021   HGB 10.5 (L)  03/27/2021   HCT 32.5 (L) 03/27/2021   MCV 96.4 03/27/2021   PLT 212 03/27/2021   NEUTROABS 5.2 03/26/2021    Lab Results  Component Value Date   NA 134 (L) 03/27/2021   K 3.5 03/27/2021   CL 102 03/27/2021   CO2 27 03/27/2021    Studies:  MR THORACIC SPINE W WO CONTRAST  Result Date: 03/29/2021 CLINICAL DATA:  Initial evaluation for hematologic malignancy, staging. Multiple myeloma. EXAM: MRI THORACIC WITHOUT AND WITH CONTRAST TECHNIQUE: Multiplanar and multiecho pulse sequences of the thoracic spine were obtained without and with intravenous contrast. CONTRAST:  64m GADAVIST GADOBUTROL 1 MMOL/ML IV SOLN COMPARISON:  MRI of the lumbar spine from 03/27/2021 as well as previous MRI from 01/29/2021. FINDINGS: Alignment: Physiologic with preservation of the normal thoracic kyphosis. Vertebrae: Abnormal heterogeneous signal intensity seen throughout the visualized bone marrow, presumably related to history of multiple myeloma. There is new abnormal STIR signal intensity involving the T10 and T11 vertebral bodies, new as compared to prior thoracic spine MRI from 01/29/2021, suggesting progressive disease. There is an associated pathologic compression fracture at the inferior endplate of TR41with no  more than mild central height loss and trace 2 mm bony retropulsion, new from prior. The marrow edema at this level is likely at least in part related to the fracture. 1 cm STIR hyperintense lesion involving the T8 vertebral body is stable. Additional subcentimeter STIR hyperintense lesions involving the T6 and T7 vertebral bodies also similar. T9 hemangioma noted. Additional compression deformities involving the T12 through L3 vertebral bodies with sequelae of prior vertebral augmentation again noted. Mild chronic compression deformities at the superior endplates of T3 and T4 are stable. Vertebral body height otherwise maintained. Cord: Normal signal and morphology. Previously identified soft tissue density  within the ventral epidural space extending from L1 through L2-3 again noted, better characterized on prior MRI of the lumbar spine. No other extra osseous extension of tumor seen within the thoracic spine. Paraspinal and other soft tissues: Unremarkable. Disc levels: Mild for age multilevel disc desiccation seen throughout the thoracic spine. No significant disc bulge or focal disc herniation. No spinal stenosis. Foramina remain patent. IMPRESSION: 1. Abnormal heterogeneous signal intensity throughout the visualized bone marrow, consistent with history of multiple myeloma. Increased STIR signal intensity within the T10 and T11 vertebral bodies since prior thoracic spine MRI from 01/29/2021, suggesting progressive disease. 2. New pathologic compression fracture at the inferior endplate of T90 with no more than mild central height loss and trace 2 mm bony retropulsion. No stenosis. 3. Small amount of extra osseous tumor within the ventral epidural space at L1 through L2-3, better characterized on prior lumbar spine MRI. No other epidural or extraosseous tumor within the thoracic spine. 4. No significant canal or foraminal stenosis. Electronically Signed   By: Jeannine Boga M.D.   On: 03/29/2021 04:31    Multiple myeloma not having achieved remission [facility]) #87 year old male patient with newly diagnosed multiple myeloma is currently admitted to hospital for intractable back pain/right flank pain.  # Multiple myeloma new diagnosis-stage III -[11:14 translocation high risk] -/given symptomatic disease currently on pulse dose of steroids dexamethasone 40 mg daily for 2/4 days; also on Velcade subcu twice weekly.-Today cycle #1 day #3.  Patient due for cycle number day 4 on 7/18-tomorrow.   #Back pain/right flank pain-likely secondary to infiltration of the intercostal nerves by myeloma-T-spine MRI confirms the suspicion.  Also L-spine MRI shows enhancement of the ventral epidural soft tissue extension to  L1-L2-L3.  Await radiation oncology evaluation in the morning.  With regards to pain medication-continue steroids pulse dose 40 mg once a day for total of 4 days.  For now continue OxyContin 10 mg twice daily; and also continue oxycodone 1 to 2 pills every 4-6 hours as needed for pain.  We will hold off IV morphine for acute pain episode.  Discussed with the nurse to prioritize oral medication over IV medication-in anticipation of discharge.  This was discussed with the patient's wife [name] in detail; and confirm that they have similar prescriptions of OxyContin/oxycodone at home [facility] full bottles/unused-prescribed by [facility] outpatient]   continue IV narcotics; hopefully steroids-both anti-inflammatory/cytotoxic should help control the disease/and the pain.  Lumbar spine MRI shows enhancement of the ventral epidural soft tissue extension at L1-L2 L3.  I would recommend evaluation with thoracic lumbar spine with and without contrast.  We will consult radiation oncology Dr. Donella Stade for consideration of radiation for palliative reasons.  #Shingles prophylaxis/valacyclovir once a day  #DVT prophylaxis/GI prophylaxis-Eliquis 2.5 mg twice daily Protonix 40 mg once a day.  The above plan of care was discussed with Dr. Posey Pronto.  We will discussed with [facility] in the morning.   Cammie Sickle, MD 03/29/2021  9:02 PM

## 2021-03-30 ENCOUNTER — Ambulatory Visit: Payer: PPO | Admitting: Radiation Oncology

## 2021-03-30 ENCOUNTER — Inpatient Hospital Stay: Payer: PPO

## 2021-03-30 DIAGNOSIS — R109 Unspecified abdominal pain: Secondary | ICD-10-CM | POA: Diagnosis not present

## 2021-03-30 DIAGNOSIS — J9601 Acute respiratory failure with hypoxia: Secondary | ICD-10-CM | POA: Diagnosis not present

## 2021-03-30 DIAGNOSIS — C9000 Multiple myeloma not having achieved remission: Secondary | ICD-10-CM | POA: Diagnosis not present

## 2021-03-30 DIAGNOSIS — K5900 Constipation, unspecified: Secondary | ICD-10-CM | POA: Diagnosis not present

## 2021-03-30 DIAGNOSIS — I10 Essential (primary) hypertension: Secondary | ICD-10-CM | POA: Diagnosis not present

## 2021-03-30 MED ORDER — DEXAMETHASONE 4 MG PO TABS
20.0000 mg | ORAL_TABLET | Freq: Once | ORAL | Status: DC
Start: 2021-03-30 — End: 2021-04-03

## 2021-03-30 MED ORDER — ENOXAPARIN SODIUM 80 MG/0.8ML IJ SOSY
1.0000 mg/kg | PREFILLED_SYRINGE | Freq: Two times a day (BID) | INTRAMUSCULAR | Status: DC
  Administered 2021-03-30 – 2021-04-01 (×4): 75 mg via SUBCUTANEOUS
  Filled 2021-03-30 (×7): qty 0.75

## 2021-03-30 MED ORDER — BORTEZOMIB CHEMO SQ INJECTION 3.5 MG (2.5MG/ML)
1.3000 mg/m2 | Freq: Once | INTRAMUSCULAR | Status: AC
  Administered 2021-03-30: 2.5 mg via SUBCUTANEOUS
  Filled 2021-03-30: qty 1

## 2021-03-30 MED ORDER — OXYCODONE HCL ER 10 MG PO T12A
10.0000 mg | EXTENDED_RELEASE_TABLET | Freq: Once | ORAL | Status: AC
  Administered 2021-04-03: 10 mg via ORAL
  Filled 2021-03-30 (×2): qty 1

## 2021-03-30 MED ORDER — DEXAMETHASONE 6 MG PO TABS
20.0000 mg | ORAL_TABLET | Freq: Once | ORAL | Status: DC
  Filled 2021-03-30: qty 2

## 2021-03-30 MED ORDER — OXYCODONE HCL ER 20 MG PO T12A
20.0000 mg | EXTENDED_RELEASE_TABLET | Freq: Two times a day (BID) | ORAL | Status: DC
  Administered 2021-03-31 – 2021-04-03 (×7): 20 mg via ORAL
  Filled 2021-03-30 (×8): qty 1

## 2021-03-30 MED ORDER — KETOROLAC TROMETHAMINE 15 MG/ML IJ SOLN
15.0000 mg | Freq: Four times a day (QID) | INTRAMUSCULAR | Status: AC | PRN
Start: 2021-03-30 — End: 2021-04-04
  Administered 2021-03-30 – 2021-03-31 (×2): 15 mg via INTRAVENOUS
  Filled 2021-03-30 (×2): qty 1

## 2021-03-30 MED ORDER — HYDRALAZINE HCL 20 MG/ML IJ SOLN
10.0000 mg | INTRAMUSCULAR | Status: DC | PRN
  Administered 2021-03-30 – 2021-04-02 (×2): 10 mg via INTRAVENOUS
  Filled 2021-03-30 (×2): qty 1

## 2021-03-30 NOTE — Progress Notes (Addendum)
PT Cancellation Note  Patient Details Name: Jack [name] MRN: [id number] DOB: 09-10-1933   Cancelled Treatment:    Reason Eval/Treat Not Completed: Fatigue/lethargy limiting ability to participate;Patient's level of consciousness;Pain limiting ability to participate (Chart reviewed, RN consulted. Per chart/RN pt medicated for pain exacerbation this morning, has since been drowsy to lethargic.) Pt more awake upon entry but appears as though medication is still impacting his alertness and cognition in a capacity to participate in mobility as he has been previously. Wife reports he has not been consistently attentive or focussed since her arrival at ~10:00. Be that as it may, pt declines any therapy at this time due to acute pain exacerbation. Will attempt treatment again at later date/time.   2:23 PM, 03/30/21 Etta Grandchild, PT, DPT Physical Therapist - [facility]  [phone] ([place])     [place] [facility] 03/30/2021, 2:21 PM

## 2021-03-30 NOTE — Consult Note (Signed)
NEW PATIENT EVALUATION  Name: Jack [name]  MRN: [id number]  Date:   03/26/2021     DOB: 29-Sep-1932   This 87 y.o. male patient presents to the clinic for initial evaluation of multiple myeloma with involvement of thoracic spine causing significant pain.  REFERRING PHYSICIAN: Cammie Sickle, *  CHIEF COMPLAINT:  Chief Complaint  Patient presents with   Abdominal Pain    DIAGNOSIS: The primary encounter diagnosis was Pain of upper abdomen. Diagnoses of Multiple myeloma not having achieved remission ([place]), Kappa light chain myeloma ([place]), Compression fracture of body of thoracic vertebra ([place]), and Right flank pain were also pertinent to this visit.   PREVIOUS INVESTIGATIONS:  MRI scan CT scans reviewed Pathology reviewed Clinical notes reviewed  HPI: Patient is an 87 year old male currently under treatment for multiple myeloma admitted to [facility] with intractable back pain.  Patient is currently on dexamethasone as well as Velcade.  He has infiltration of the intercostal nerves by myeloma T-spine MRI confirms suspicion.  He also has ventral epidural soft tissue extension to L1-L2-L3.  No evidence of significant canal or foraminal stenosis is noted.  He is having no decrease strength in his lower extremities.  I been asked to evaluate him for possible palliative radiation therapy to his spine.  He is being evaluated for possible kyphoplasty by Dr. Ilda Mori. PLANNED TREATMENT REGIMEN: Thoracic lumbar palliative radiation therapy  PAST MEDICAL HISTORY:  has a past medical history of Elevated prostate specific antigen (PSA), GERD (gastroesophageal reflux disease), History of colon polyps (2008), History of kidney stones, Hyperlipidemia, Hypertension, Prostate hypertrophy, and Renal disorder.    PAST SURGICAL HISTORY:  Past Surgical History:  Procedure Laterality Date   CATARACT EXTRACTION W/PHACO Left 01/10/2018   Procedure: CATARACT EXTRACTION PHACO AND INTRAOCULAR LENS PLACEMENT  (IOC);  Surgeon: Birder Robson, MD;  Location: [facility] ORS;  Service: Ophthalmology;  Laterality: Left;  [name] 00:24.8 AP% 14.9 CDE 3.68 Fluid pack lot # [id number]   CATARACT EXTRACTION W/PHACO Right 01/25/2018   Procedure: CATARACT EXTRACTION PHACO AND INTRAOCULAR LENS PLACEMENT (IOC);  Surgeon: Birder Robson, MD;  Location: [facility] ORS;  Service: Ophthalmology;  Laterality: Right;  [name] 00:42 AP% 10.8 CDE 4.59 Fluid pack lot # [id number]   COLON SURGERY     CYSTOSCOPY W/ URETERAL STENT PLACEMENT Right 10/16/2017   Procedure: right  URETERAL STENT PLACEMENT,cystoscopy bilateral stent removal,rretrograde;  Surgeon: Abbie Sons, MD;  Location: [facility] ORS;  Service: Urology;  Laterality: Right;   CYSTOSCOPY/URETEROSCOPY/HOLMIUM LASER/STENT PLACEMENT Right 12/16/2020   Procedure: CYSTOSCOPY/URETEROSCOPY/HOLMIUM LASER/STENT PLACEMENT;  Surgeon: Abbie Sons, MD;  Location: [facility] ORS;  Service: Urology;  Laterality: Right;   EXTRACORPOREAL SHOCK WAVE LITHOTRIPSY Right 12/11/2020   Procedure: EXTRACORPOREAL SHOCK WAVE LITHOTRIPSY (ESWL);  Surgeon: Abbie Sons, MD;  Location: [facility] OR[facility];  Service: Urology;  Laterality: Right;   IR KYPHO EA ADDL LEVEL THORACIC OR LUMBAR  02/02/2021   IR KYPHO LUMBAR INC FX REDUCE BONE BX UNI/BIL CANNULATION INC/IMAGING  02/02/2021   KIDNEY STONE SURGERY     KYPHOPLASTY N/A 03/12/2021   Procedure: Hewitt Shorts, L3;  Surgeon: Hessie Knows, MD;  Location: [facility] ORS;  Service: Orthopedics;  Laterality: N/A;   RESECTION SOFT TISSUE TUMOR LEG / ANKLE RADICAL  jan 2009   [facility],  right thigh/knee , nonmalignant   SMALL INTESTINE SURGERY  1946   implaed on picket fence, punctured stomach   TONSILLECTOMY      FAMILY HISTORY: family history includes Cancer in his sister; Hyperlipidemia in his father;  Hypertension in his father.  SOCIAL HISTORY:  reports that he quit smoking about 55 years ago. He has never used smokeless tobacco. He reports current alcohol use of about 1.0  standard drink of alcohol per week. He reports that he does not use drugs.  ALLERGIES: Quinolones, Amlodipine, Azithromycin, Lisinopril, and Morphine and related  MEDICATIONS:  Current Facility-Administered Medications  Medication Dose Route Frequency Provider Last Rate Last Admin   atorvastatin (LIPITOR) tablet 40 mg  40 mg Oral QHS Cox, Amy N, DO   40 mg at 03/29/21 2208   baclofen (LIORESAL) tablet 10 mg  10 mg Oral TID Fritzi Mandes, MD   10 mg at 03/30/21 4854   bisacodyl (DULCOLAX) suppository 10 mg  10 mg Rectal Daily PRN Fritzi Mandes, MD   10 mg at 03/29/21 1521   carvedilol (COREG) tablet 25 mg  25 mg Oral BID WC Cox, Amy N, DO   25 mg at 03/30/21 6270   dexamethasone (DECADRON) tablet 20 mg  20 mg Oral Once Cammie Sickle, MD       dexamethasone (DECADRON) tablet 40 mg  40 mg Oral Daily Charlaine Dalton R, MD   40 mg at 03/30/21 3500   docusate sodium (COLACE) capsule 100 mg  100 mg Oral BID PRN Fritzi Mandes, MD       enoxaparin (LOVENOX) injection 75 mg  1 mg/kg Subcutaneous Q12H Fritzi Mandes, MD       feeding supplement (ENSURE ENLIVE / ENSURE PLUS) liquid 237 mL  237 mL Oral BID BM Fritzi Mandes, MD   237 mL at 03/29/21 1400   gabapentin (NEURONTIN) capsule 300 mg  300 mg Oral TID Fritzi Mandes, MD   300 mg at 03/30/21 9381   hydrALAZINE (APRESOLINE) injection 10 mg  10 mg Intravenous Q4H PRN Rise Patience, MD   10 mg at 03/30/21 0559   lidocaine (LIDODERM) 5 % 1 patch  1 patch Transdermal Q24H Cox, Amy N, DO   1 patch at 03/29/21 1942   morphine 2 MG/ML injection 1 mg  1 mg Intravenous Q4H PRN Fritzi Mandes, MD   1 mg at 03/29/21 1808   multivitamin with minerals tablet 1 tablet  1 tablet Oral Daily Fritzi Mandes, MD   1 tablet at 03/30/21 0821   ondansetron (ZOFRAN) tablet 4 mg  4 mg Oral Q6H PRN Cox, Amy N, DO       Or   ondansetron (ZOFRAN) injection 4 mg  4 mg Intravenous Q6H PRN Cox, Amy N, DO       oxyCODONE (OXYCONTIN) 12 hr tablet 10 mg  10 mg Oral Once Cammie Sickle, MD       oxyCODONE (OXYCONTIN) 12 hr tablet 20 mg  20 mg Oral Q12H Cammie Sickle, MD       oxyCODONE-acetaminophen (PERCOCET/ROXICET) 5-325 MG per tablet 1-2 tablet  1-2 tablet Oral Q4H PRN Fritzi Mandes, MD   2 tablet at 03/29/21 1942   pantoprazole (PROTONIX) EC tablet 40 mg  40 mg Oral QHS Charlaine Dalton R, MD   40 mg at 03/29/21 2208   polyethylene glycol (MIRALAX / GLYCOLAX) packet 17 g  17 g Oral BID Fritzi Mandes, MD   17 g at 03/30/21 8299   senna-docusate (Senokot-S) tablet 1 tablet  1 tablet Oral BID Cox, Amy N, DO   1 tablet at 03/30/21 0821   traMADol (ULTRAM) tablet 50 mg  50 mg Oral Q6H PRN Fritzi Mandes, MD   50 mg  at 03/27/21 1704   valACYclovir (VALTREX) tablet 500 mg  500 mg Oral Daily Charlaine Dalton R, MD   500 mg at 03/30/21 0821    ECOG PERFORMANCE STATUS:  2 - Symptomatic, <50% confined to bed  REVIEW OF SYSTEMS: Patient denies any weight loss, fatigue, weakness, fever, chills or night sweats. Patient denies any loss of vision, blurred vision. Patient denies any ringing  of the ears or hearing loss. No irregular heartbeat. Patient denies heart murmur or history of fainting. Patient denies any chest pain or pain radiating to her upper extremities. Patient denies any shortness of breath, difficulty breathing at night, cough or hemoptysis. Patient denies any swelling in the lower legs. Patient denies any nausea vomiting, vomiting of blood, or coffee ground material in the vomitus. Patient denies any stomach pain. Patient states has had normal bowel movements no significant constipation or diarrhea. Patient denies any dysuria, hematuria or significant nocturia. Patient denies any problems walking, swelling in the joints or loss of balance. Patient denies any skin changes, loss of hair or loss of weight. Patient denies any excessive worrying or anxiety or significant depression. Patient denies any problems with insomnia. Patient denies excessive thirst, polyuria,  polydipsia. Patient denies any swollen glands, patient denies easy bruising or easy bleeding. Patient denies any recent infections, allergies or URI. Patient "s visual fields have not changed significantly in recent time.   PHYSICAL EXAM: BP (!) 145/103 Comment: Dr. Rogue Bussing at bedside. and okay to proceed with Velcade per Dr. Rogue Bussing.  Pulse (!) 53   Temp 97.6 F (36.4 C) (Oral)   Resp 14   Ht _0  (1.753 m) Comment: per chart review  Wt 166 lb 0.1 oz (75.3 kg)   SpO2 100%   BMI 24.51 kg/m  Motor or sensory and DTR levels are equal and symmetric in lower extremities.  Proprioception is intact.  Palpation of the spine does not elicit pain.  Well-developed well-nourished patient in NAD. HEENT reveals PERLA, EOMI, discs not visualized.  Oral cavity is clear. No oral mucosal lesions are identified. Neck is clear without evidence of cervical or supraclavicular adenopathy. Lungs are clear to A&P. Cardiac examination is essentially unremarkable with regular rate and rhythm without murmur rub or thrill. Abdomen is benign with no organomegaly or masses noted. Motor sensory and DTR levels are equal and symmetric in the upper and lower extremities. Cranial nerves II through XII are grossly intact. Proprioception is intact. No peripheral adenopathy or edema is identified. No motor or sensory levels are noted. Crude visual fields are within normal range.  LABORATORY DATA: Pathology and labs are reviewed    RADIOLOGY RESULTS: CT scans MRI scans reviewed compatible with above-stated findings   IMPRESSION: Progressive multiple myeloma within the thoracic lumbar spine and patient with known multiple myeloma currently under therapy in 87 year old male  PLAN: This time elected ahead with palliative radiation therapy.  We will treat his lower thoracic lumbar spine with 30 Gray in 10 fractions.  I have set him up for simulation as soon as possible and will start treatments hopefully this week.  Risks and  benefits of treatment including possible diarrhea fatigue alteration of blood counts skin reaction all were reviewed with the patient.  He seems to comprehend my recommendations well.  His wife is a former patient of mine.  I would like to take this opportunity to thank you for allowing me to participate in the care of your patient.Noreene Filbert, MD

## 2021-03-30 NOTE — Progress Notes (Signed)
Jack [name]   DOB:04/25/1933   PV#:[id number]    Subjective: Patient continues to have significant abdominal pain/especially at nighttime.  However patient has not received any IV pain medication the last 24 hours. Objective:  Vitals:   03/30/21 1214 03/30/21 1928  BP: (!) 145/103 112/75  Pulse: (!) 53 76  Resp:  (!) 22  Temp:  98 F (36.7 C)  SpO2:  99%     Intake/Output Summary (Last 24 hours) at 03/30/2021 2301 Last data filed at 03/30/2021 1852 Gross per 24 hour  Intake 480 ml  Output 1425 ml  Net -945 ml    Physical Exam Vitals and nursing note reviewed.  Constitutional:      Comments: Patient resting in the bed.  Accompanied by his wife Jack [name].   HENT:     Head: Normocephalic and atraumatic.     Mouth/Throat:     Mouth: Mucous membranes are moist.     Pharynx: No oropharyngeal exudate.  Eyes:     Pupils: Pupils are equal, round, and reactive to light.  Cardiovascular:     Rate and Rhythm: Normal rate and regular rhythm.  Pulmonary:     Effort: No respiratory distress.     Breath sounds: No wheezing.     Comments: Decreased breath sounds bilaterally at bases.  No wheeze or crackles Abdominal:     General: Bowel sounds are normal. There is no distension.     Palpations: Abdomen is soft. There is no mass.     Tenderness: There is no abdominal tenderness. There is no guarding or rebound.     Comments: Right flank "mass"-noted however no significant tenderness.  Musculoskeletal:        General: No tenderness. Normal range of motion.     Cervical back: Normal range of motion and neck supple.  Skin:    General: Skin is warm.  Neurological:     Mental Status: He is alert and oriented to person, place, and time.  Psychiatric:        Mood and Affect: Affect normal.        Judgment: Judgment normal.     Labs:  Lab Results  Component Value Date   WBC 4.8 03/27/2021   HGB 10.5 (L) 03/27/2021   HCT 32.5 (L) 03/27/2021   MCV 96.4 03/27/2021   PLT 212  03/27/2021   NEUTROABS 5.2 03/26/2021    Lab Results  Component Value Date   NA 134 (L) 03/27/2021   K 3.5 03/27/2021   CL 102 03/27/2021   CO2 27 03/27/2021    Studies:  No results found.  Multiple myeloma not having achieved remission [facility]) #87 year old male patient with newly diagnosed multiple myeloma is currently admitted to hospital for intractable back pain/right flank pain.  # Multiple myeloma new diagnosis-stage III -[11:14 translocation high risk] -/given symptomatic disease currently on pulse dose of steroids dexamethasone 40 mg daily for 2/4 days; also on Velcade subcu twice weekly.-Today cycle #1 day# 4 on 7/18.  Also scheduled for day 8 and day 11velcade.   #Back pain/right flank pain-likely secondary to infiltration of the intercostal nerves by myeloma-T-spine MRI confirms the suspicion.  Also L-spine MRI shows enhancement of the ventral epidural soft tissue extension to L1-L2-L3.  Discussed with radiation oncology Dr. Crystal-recommend starting radiation ASAP-given the epidural tumor/enhancement along the thoracic and lumbar nerve roots.  Also discussed with Dr.Menz-plan to hold off kyphoplasty at this time.  We will increase the dose of OxyContin to 20 mg twice  daily; continue oxycodone 5 to 10 mg every 6-8 hours as needed.  Would also recommend evaluation with Josh Borders the morning.  #Shingles prophylaxis/valacyclovir once a day  #DVT prophylaxis/GI prophylaxis-discontinue Eliquis; Lovenox 40 mg daily daily; Protonix 40 mg once a day.  The above plan of care was discussed with Dr. Posey Pronto; Dr.Menz & Dr.Chrystal.   We will discuss with Josh Borders in the morning.    Cammie Sickle, MD 03/30/2021  11:01 PM

## 2021-03-30 NOTE — Care Management Important Message (Signed)
Important Message  Patient Details  Name: Jack [name] MRN: [id number] Date of Birth: [birth year]   Medicare Important Message Given:  Yes     [name] 03/30/2021, 12:17 PM

## 2021-03-30 NOTE — Progress Notes (Signed)
Mobility Specialist - Progress Note   03/30/21 1100  Mobility  Activity Contraindicated/medical hold  Mobility performed by Mobility specialist    Per discussion with nurse, pt with increased pain this AM and lethargic limiting participation for activity. Will hold off and attempt session when appropriate.    Kathee Delton Mobility Specialist 03/30/21, 11:33 AM
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
## 2021-03-30 NOTE — Progress Notes (Addendum)
[facility] NAME: Jack [name]    MR#:  [id number]  DATE OF BIRTH:  Mar 30, 1933  SUBJECTIVE:   patient came in with ongoing acute on severe right flank/backpain.  Patient feels his pain is more today with IV and po pain meds No family in the room  Had small BM after enema yday REVIEW OF SYSTEMS:   Review of Systems  Constitutional:  Negative for chills, fever and weight loss.  HENT:  Negative for ear discharge, ear pain and nosebleeds.   Eyes:  Negative for blurred vision, pain and discharge.  Respiratory:  Negative for sputum production, shortness of breath, wheezing and stridor.   Cardiovascular:  Negative for chest pain, palpitations, orthopnea and PND.  Gastrointestinal:  Positive for abdominal pain and constipation. Negative for diarrhea, nausea and vomiting.  Genitourinary:  Negative for frequency and urgency.  Musculoskeletal:  Positive for back pain. Negative for joint pain.  Neurological:  Positive for weakness. Negative for sensory change, speech change and focal weakness.  Psychiatric/Behavioral:  Negative for depression and hallucinations. The patient is not nervous/anxious.    Tolerating Diet:yes Tolerating PT: HHPT  DRUG ALLERGIES:   Allergies  Allergen Reactions  . Quinolones     Aortic dilation contraindicates FQ  . Amlodipine Other (See Comments)    LE edema  . Azithromycin Nausea And Vomiting  . Lisinopril Cough  . Morphine And Related Nausea And Vomiting    VITALS:  Blood pressure (!) 176/97, pulse 65, temperature 97.6 F (36.4 C), temperature source Oral, resp. rate 14, height 5' 9" (1.753 m), weight 75.3 kg, SpO2 100 %.  PHYSICAL EXAMINATION:   Physical Exam  GENERAL:  87 y.o.-year-old patient lying in the bed with acute distress due to pain  LUNGS: Normal breath sounds bilaterally, no wheezing, rales, rhonchi. No use of accessory muscles of respiration.  CARDIOVASCULAR: S1, S2 normal. No  murmurs, rubs, or gallops.  ABDOMEN: Soft, mild right flank tender, nondistended. Bowel sounds present. No organomegaly or mass. Right flank bulge+ EXTREMITIES: No cyanosis, clubbing or edema b/l.    NEUROLOGIC: non focal. PSYCHIATRIC:  patient is alert and oriented x 3.  SKIN: No obvious rash, lesion, or ulcer.   LABORATORY PANEL:  CBC Recent Labs  Lab 03/27/21 0646  WBC 4.8  HGB 10.5*  HCT 32.5*  PLT 212     Chemistries  Recent Labs  Lab 03/26/21 1549 03/27/21 0646  NA 135 134*  K 4.2 3.5  CL 100 102  CO2 22 27  GLUCOSE 98 90  BUN 29* 23  CREATININE 1.15 0.94  CALCIUM 9.2 8.3*  AST 41  --   ALT 25  --   ALKPHOS 104  --   BILITOT 1.4*  --     Cardiac Enzymes No results for input(s): TROPONINI in the last 168 hours. RADIOLOGY:  MR THORACIC SPINE W WO CONTRAST  Result Date: 03/29/2021 CLINICAL DATA:  Initial evaluation for hematologic malignancy, staging. Multiple myeloma. EXAM: MRI THORACIC WITHOUT AND WITH CONTRAST TECHNIQUE: Multiplanar and multiecho pulse sequences of the thoracic spine were obtained without and with intravenous contrast. CONTRAST:  87m GADAVIST GADOBUTROL 1 MMOL/ML IV SOLN COMPARISON:  MRI of the lumbar spine from 03/27/2021 as well as previous MRI from 01/29/2021. FINDINGS: Alignment: Physiologic with preservation of the normal thoracic kyphosis. Vertebrae: Abnormal heterogeneous signal intensity seen throughout the visualized bone marrow, presumably related to history of multiple myeloma. There is new abnormal STIR signal intensity involving  the T10 and T11 vertebral bodies, new as compared to prior thoracic spine MRI from 01/29/2021, suggesting progressive disease. There is an associated pathologic compression fracture at the inferior endplate of G28 with no more than mild central height loss and trace 2 mm bony retropulsion, new from prior. The marrow edema at this level is likely at least in part related to the fracture. 1 cm STIR hyperintense  lesion involving the T8 vertebral body is stable. Additional subcentimeter STIR hyperintense lesions involving the T6 and T7 vertebral bodies also similar. T9 hemangioma noted. Additional compression deformities involving the T12 through L3 vertebral bodies with sequelae of prior vertebral augmentation again noted. Mild chronic compression deformities at the superior endplates of T3 and T4 are stable. Vertebral body height otherwise maintained. Cord: Normal signal and morphology. Previously identified soft tissue density within the ventral epidural space extending from L1 through L2-3 again noted, better characterized on prior MRI of the lumbar spine. No other extra osseous extension of tumor seen within the thoracic spine. Paraspinal and other soft tissues: Unremarkable. Disc levels: Mild for age multilevel disc desiccation seen throughout the thoracic spine. No significant disc bulge or focal disc herniation. No spinal stenosis. Foramina remain patent. IMPRESSION: 1. Abnormal heterogeneous signal intensity throughout the visualized bone marrow, consistent with history of multiple myeloma. Increased STIR signal intensity within the T10 and T11 vertebral bodies since prior thoracic spine MRI from 01/29/2021, suggesting progressive disease. 2. New pathologic compression fracture at the inferior endplate of Z66 with no more than mild central height loss and trace 2 mm bony retropulsion. No stenosis. 3. Small amount of extra osseous tumor within the ventral epidural space at L1 through L2-3, better characterized on prior lumbar spine MRI. No other epidural or extraosseous tumor within the thoracic spine. 4. No significant canal or foraminal stenosis. Electronically Signed   By: Jeannine Boga M.D.   On: 03/29/2021 04:31   ASSESSMENT AND PLAN:   Jack [name] is a 87 y.o. male with medical history significant for paroxysmal atrial fibrillation, PACs, coronary calcium and aortic atherosclerosis, diastolic  dysfunction, Q9UT, GERD, aortic root dilatation, ascending aortic dilatation, hypertension, GERD, hypertriglyceridemia, CKD stage IIIb, BPH, multiple myeloma, prior tobacco use, presents to the emergency department from oncology clinic for persistent left flank pain.  Severe back pain radiating to the rightflank suspected due to pathology compression fracture T12--L4 Radiculopathy -- IV morphine prn, PRN tramadol, OxyContin BID, percocet prn -- gabapentin increase dose to 300 mg TID,continue baclofen -- patient has history of compression fractures in the past underwent kyphoplasty x2 (per old record) --MRI Thoracic and lumbar spine-- Abnormal heterogeneous signal intensity throughout the visualized bone marrow, consistent with history of multiple myeloma. Increased STIR signal intensity within the T10 and T11 vertebral bodies since prior thoracic spine MRI from 01/29/2021, suggesting progressive disease. 2. New pathologic compression fracture at the inferior endplate of M54 with no more than mild central height loss and trace 2 mm bony retropulsion. No stenosis. 3. Small amount of extra osseous tumor within the ventral epidural space at L1 through L2-3, better characterized on prior lumbar spine MRI. No other epidural or extraosseous tumor within the thoracic spine. 4. No significant canal or foraminal stenosis. multiple myeloma with pathology compression fracture -- patient seen by Dr. Rogue Bussing -- started on high dose prednisone and Valtrex --7/18--Dr Chrystal--Radiation oncology to see pt. Pt continues to get prn IV pain meds along with po's. He feels he and his wife will not be able to manage  this on their own. --Dr Rudene Christians to see patient to evaluate if his candidate for Kyphoplasty for T 10  compression fracture. HOLDING Eliquis and change to lovenox bid incase Dr Rudene Christians takes pt for kpyphoplasty at later date during this hospital stay  chronic constipation -- continue bowel  prep --7/17--small BM after enema  Hypertension -- continue Coreg  history of a fib -- continue Coreg and eliquis   Nutrition Status: Nutrition Problem: Severe Malnutrition (in the context of chronic illness) Etiology: decreased appetite Signs/Symptoms: severe muscle depletion, severe fat depletion, percent weight loss (17% weight loss x 3 months) Percent weight loss: 17 % Interventions: Ensure Enlive (each supplement provides 350kcal and 20 grams of protein), MVI, Liberalize Diet  PT  recommends HHPT  Family communication :spoke with wife in the room Consults : oncology, orthopedic, radiation oncology CODE STATUS: full DVT Prophylaxis : eliquis Level of care: Med-Surg Status is: Inpatient  Remains inpatient appropriate because:Ongoing active pain requiring inpatient pain management Radiation oncology to see todya  Dispo: The patient is from: Home              Anticipated d/c is to: TBD              Patient currently is not medically stable to d/c. Due to severe pain requiring IV and po pain meds   Difficult to place patient No  Will cont with pain meds and rx of myeloma.       TOTAL TIME TAKING CARE OF THIS PATIENT: 25 minutes.  >50% time spent on counselling and coordination of care  Note: This dictation was prepared with Dragon dictation along with smaller phrase technology. Any transcriptional errors that result from this process are unintentional.  Fritzi Mandes M.D    [facility]   CC: Primary care physician; [name], MD Patient ID: Jack [name], male   DOB: Mar 12, 1933, 87 y.o.   MRN: [id number]

## 2021-03-31 ENCOUNTER — Ambulatory Visit: Payer: PPO

## 2021-03-31 ENCOUNTER — Encounter: Payer: Self-pay | Admitting: Internal Medicine

## 2021-03-31 ENCOUNTER — Inpatient Hospital Stay: Payer: PPO | Admitting: Oncology

## 2021-03-31 DIAGNOSIS — J9601 Acute respiratory failure with hypoxia: Secondary | ICD-10-CM | POA: Diagnosis not present

## 2021-03-31 DIAGNOSIS — Z515 Encounter for palliative care: Secondary | ICD-10-CM | POA: Diagnosis not present

## 2021-03-31 MED ORDER — POLYETHYLENE GLYCOL 3350 17 G PO PACK
34.0000 g | PACK | ORAL | Status: AC
  Administered 2021-03-31 (×2): 34 g via ORAL
  Filled 2021-03-31 (×2): qty 2

## 2021-03-31 NOTE — Progress Notes (Signed)
PROGRESS NOTE    Jack [name]  KMM:[id number] DOB: [birth year] DOA: 03/26/2021 PCP: [name], MD  [id number]-AA   Assessment & Plan:   Principal Problem:   Acute hypoxemic respiratory failure ([place]) Active Problems:   Essential hypertension   GERD   Prostate hypertrophy   Hyperlipidemia   Constipation   Multiple myeloma not having achieved remission (HCC)   Right flank pain   Compression fracture of body of thoracic vertebra (HCC)   Jack [name] is a 87 y.o. male with medical history significant for paroxysmal atrial fibrillation, PACs, coronary calcium and aortic atherosclerosis, diastolic dysfunction, B9UX, GERD, aortic root dilatation, ascending aortic dilatation, hypertension, GERD, hypertriglyceridemia, CKD stage IIIb, BPH, multiple myeloma, prior tobacco use, presents to the emergency department from oncology clinic for persistent left flank pain.   Severe back pain radiating to the rightflank suspected due to pathology compression fracture T12--L4 Radiculopathy -- patient has history of compression fractures in the past underwent kyphoplasty x2 (per old record) --oncology following -- started on high dose steroid and Valtrex --7/18--Dr Chrystal--Radiation oncology to see pt. Plan: --Dr Rudene Christians to see patient to evaluate if his candidate for Kyphoplasty for T 10  compression fracture.  --pain management per oncology --HOLDING Eliquis and change to lovenox bid incase Dr Rudene Christians takes pt for kpyphoplasty at later date during this hospital stay --cont decadron, per oncology   Stage III kappa chain myeloma --started pulse dose decadron by oncology --management per oncology  chronic constipation -- aggressive miralax   Hypertension --cont coreg   history of a fib --cont coreg --hold eliquis   Nutrition Status: Nutrition Problem: Severe Malnutrition (in the context of chronic illness) Etiology: decreased appetite Signs/Symptoms: severe muscle depletion,  severe fat depletion, percent weight loss (17% weight loss x 3 months) Percent weight loss: 17 % Interventions: Ensure Enlive (each supplement provides 350kcal and 20 grams of protein), MVI, Liberalize Diet   DVT prophylaxis: Lovenox SQ Code Status: Full code  Family Communication: wife updated at bedside today Level of care: Med-Surg Dispo:   The patient is from: home Anticipated d/c is to: home Anticipated d/c date is: 1-2 days Patient currently is not medically ready to d/c due to: pain control   Subjective and Interval History:  Pt reported pain well controlled today.  Haven't had a BM in at least a week.   Objective: Vitals:   03/30/21 1928 03/31/21 0342 03/31/21 0738 03/31/21 1508  BP: 112/75 (!) 170/94 (!) 129/94 127/76  Pulse: 76 62 66 (!) 57  Resp: (!) _0 Temp: 98 F (36.7 C) (!) 97.4 F (36.3 C) 98.6 F (37 C) (!) 97.5 F (36.4 C)  TempSrc:  Oral  Oral  SpO2: 99% 99% 94% 95%  Weight:      Height:        Intake/Output Summary (Last 24 hours) at 03/31/2021 1914 Last data filed at 03/31/2021 1700 Gross per 24 hour  Intake 720 ml  Output 100 ml  Net 620 ml   Filed Weights   03/26/21 1536 03/26/21 2207  Weight: 81.6 kg 75.3 kg    Examination:   Constitutional: NAD, AAOx3 HEENT: conjunctivae and lids normal, EOMI CV: No cyanosis.   RESP: normal respiratory effort, on RA Extremities: No effusions, edema in BLE SKIN: warm, dry Neuro: II - XII grossly intact.   Psych: Normal mood and affect.  Appropriate judgement and reason   Data Reviewed: I have personally reviewed following labs and imaging studies  CBC:  Recent Labs  Lab 03/26/21 1549 03/27/21 0646  WBC 6.9 4.8  NEUTROABS 5.2  --   HGB 12.2* 10.5*  HCT 38.5* 32.5*  MCV 100.3* 96.4  PLT 247 130   Basic Metabolic Panel: Recent Labs  Lab 03/26/21 1549 03/27/21 0646  NA 135 134*  K 4.2 3.5  CL 100 102  CO2 22 27  GLUCOSE 98 90  BUN 29* 23  CREATININE 1.15 0.94  CALCIUM  9.2 8.3*   GFR: Estimated Creatinine Clearance: 57.5 mL/min (by C-G formula based on SCr of 0.94 mg/dL). Liver Function Tests: Recent Labs  Lab 03/26/21 1549  AST 41  ALT 25  ALKPHOS 104  BILITOT 1.4*  PROT 6.0*  ALBUMIN 3.8   Recent Labs  Lab 03/26/21 1549  LIPASE 32   No results for input(s): AMMONIA in the last 168 hours. Coagulation Profile: No results for input(s): INR, PROTIME in the last 168 hours. Cardiac Enzymes: No results for input(s): CKTOTAL, CKMB, CKMBINDEX, TROPONINI in the last 168 hours. BNP (last 3 results) No results for input(s): PROBNP in the last 8760 hours. HbA1C: No results for input(s): HGBA1C in the last 72 hours. CBG: No results for input(s): GLUCAP in the last 168 hours. Lipid Profile: No results for input(s): CHOL, HDL, LDLCALC, TRIG, CHOLHDL, LDLDIRECT in the last 72 hours. Thyroid Function Tests: No results for input(s): TSH, T4TOTAL, FREET4, T3FREE, THYROIDAB in the last 72 hours. Anemia Panel: No results for input(s): VITAMINB12, FOLATE, FERRITIN, TIBC, IRON, RETICCTPCT in the last 72 hours. Sepsis Labs: Recent Labs  Lab 03/26/21 1625  LATICACIDVEN 4.0*    Recent Results (from the past 240 hour(s))  Resp Panel by RT-PCR (Flu A&B, Covid) Nasopharyngeal Swab     Status: None   Collection Time: 03/26/21  4:24 PM   Specimen: Nasopharyngeal Swab; Nasopharyngeal(NP) swabs in vial transport medium  Result Value Ref Range Status   SARS Coronavirus 2 by RT PCR NEGATIVE NEGATIVE Final    Comment: (NOTE) SARS-CoV-2 target nucleic acids are NOT DETECTED.  The SARS-CoV-2 RNA is generally detectable in upper respiratory specimens during the acute phase of infection. The lowest concentration of SARS-CoV-2 viral copies this assay can detect is 138 copies/mL. A negative result does not preclude SARS-Cov-2 infection and should not be used as the sole basis for treatment or other patient management decisions. A negative result may occur with   improper specimen collection/handling, submission of specimen other than nasopharyngeal swab, presence of viral mutation(s) within the areas targeted by this assay, and inadequate number of viral copies(<138 copies/mL). A negative result must be combined with clinical observations, patient history, and epidemiological information. The expected result is Negative.  Fact Sheet for Patients:  EntrepreneurPulse.com.au  Fact Sheet for Healthcare Providers:  IncredibleEmployment.be  This test is no t yet approved or cleared by the [place] FDA and  has been authorized for detection and/or diagnosis of SARS-CoV-2 by FDA under an Emergency Use Authorization (EUA). This EUA will remain  in effect (meaning this test can be used) for the duration of the COVID-19 declaration under Section 564(b)(1) of the Act, 21 U.S.C.section 360bbb-3(b)(1), unless the authorization is terminated  or revoked sooner.       Influenza A by PCR NEGATIVE NEGATIVE Final   Influenza B by PCR NEGATIVE NEGATIVE Final    Comment: (NOTE) The Xpert Xpress SARS-CoV-2/FLU/RSV plus assay is intended as an aid in the diagnosis of influenza from Nasopharyngeal swab specimens and should not be used as a sole basis  for treatment. Nasal washings and aspirates are unacceptable for Xpert Xpress SARS-CoV-2/FLU/RSV testing.  Fact Sheet for Patients: EntrepreneurPulse.com.au  Fact Sheet for Healthcare Providers: IncredibleEmployment.be  This test is not yet approved or cleared by the [place] FDA and has been authorized for detection and/or diagnosis of SARS-CoV-2 by FDA under an Emergency Use Authorization (EUA). This EUA will remain in effect (meaning this test can be used) for the duration of the COVID-19 declaration under Section 564(b)(1) of the Act, 21 U.S.C. section 360bbb-3(b)(1), unless the authorization is terminated  or revoked.  Performed at [facility], [place], [place] [address]   Respiratory (~20 pathogens) panel by PCR     Status: None   Collection Time: 03/26/21  6:40 PM   Specimen: Nasopharyngeal Swab; Respiratory  Result Value Ref Range Status   Adenovirus NOT DETECTED NOT DETECTED Final   Coronavirus 229E NOT DETECTED NOT DETECTED Final    Comment: (NOTE) The Coronavirus on the Respiratory Panel, DOES NOT test for the novel  Coronavirus (2019 nCoV)    Coronavirus HKU1 NOT DETECTED NOT DETECTED Final   Coronavirus NL63 NOT DETECTED NOT DETECTED Final   Coronavirus OC43 NOT DETECTED NOT DETECTED Final   Metapneumovirus NOT DETECTED NOT DETECTED Final   Rhinovirus / Enterovirus NOT DETECTED NOT DETECTED Final   Influenza A NOT DETECTED NOT DETECTED Final   Influenza B NOT DETECTED NOT DETECTED Final   Parainfluenza Virus 1 NOT DETECTED NOT DETECTED Final   Parainfluenza Virus 2 NOT DETECTED NOT DETECTED Final   Parainfluenza Virus 3 NOT DETECTED NOT DETECTED Final   Parainfluenza Virus 4 NOT DETECTED NOT DETECTED Final   Respiratory Syncytial Virus NOT DETECTED NOT DETECTED Final   Bordetella pertussis NOT DETECTED NOT DETECTED Final   Bordetella Parapertussis NOT DETECTED NOT DETECTED Final   Chlamydophila pneumoniae NOT DETECTED NOT DETECTED Final   Mycoplasma pneumoniae NOT DETECTED NOT DETECTED Final    Comment: Performed at [facility], [place]. [address], [place], [place] [address]      Radiology Studies: No results found.   Scheduled Meds:  atorvastatin  40 mg Oral QHS   baclofen  10 mg Oral TID   carvedilol  25 mg Oral BID WC   dexamethasone  20 mg Oral Once   dexamethasone  40 mg Oral Daily   enoxaparin (LOVENOX) injection  1 mg/kg Subcutaneous Q12H   feeding supplement  237 mL Oral BID BM   gabapentin  300 mg Oral TID   lidocaine  1 patch Transdermal Q24H   multivitamin with minerals  1 tablet Oral Daily   oxyCODONE  10 mg Oral Once    oxyCODONE  20 mg Oral Q12H   pantoprazole  40 mg Oral QHS   senna-docusate  1 tablet Oral BID   valACYclovir  500 mg Oral Daily   Continuous Infusions:   LOS: 4 days     Enzo Bi, MD [facility] If 7PM-7AM, please contact night-coverage 03/31/2021, 7:14 PM

## 2021-03-31 NOTE — Progress Notes (Signed)
[place]  [phone]) [phone] [phone]) [phone]  ID: Jack [name] OB: 1932-11-22  MR#: [id number]  VXY#:[id number]  Patient Care Team: [name], MD as PCP - General (Family Medicine) End, [name], MD as PCP - Cardiology (Cardiology) [name], MD (Endocrinology)  CHIEF COMPLAINT: Stage III kappa chain myeloma.  INTERVAL HISTORY: Patient more lethargic today, but pain better controlled.  Velcade and dexamethasone started emergently last week and patient tolerated his treatments well.  He has increased weakness and fatigue, poor appetite, and weight loss.  Patient's wife at bedside.  No further complaints.  REVIEW OF SYSTEMS:   Review of Systems  Constitutional:  Positive for malaise/fatigue and weight loss. Negative for fever.  Respiratory: Negative.  Negative for cough, hemoptysis and shortness of breath.   Cardiovascular: Negative.  Negative for chest pain and leg swelling.  Gastrointestinal: Negative.  Negative for abdominal pain and nausea.  Genitourinary: Negative.  Negative for dysuria.  Musculoskeletal:  Positive for back pain.  Skin: Negative.  Negative for itching and rash.  Neurological:  Positive for weakness. Negative for dizziness and focal weakness.  Psychiatric/Behavioral: Negative.  The patient is not nervous/anxious.    As per HPI. Otherwise, a complete review of systems is negative.  PAST MEDICAL HISTORY: Past Medical History:  Diagnosis Date   Elevated prostate specific antigen (PSA)    has been 7 for a year    GERD (gastroesophageal reflux disease)    History of colon polyps 2008   [facility],    History of kidney stones    Hyperlipidemia    Hypertension    Prostate hypertrophy    diagnosed at age 40 due to hematospermia   Renal disorder     PAST SURGICAL HISTORY: Past Surgical History:  Procedure Laterality Date   CATARACT EXTRACTION W/PHACO Left 01/10/2018   Procedure: CATARACT EXTRACTION  PHACO AND INTRAOCULAR LENS PLACEMENT ([place]);  Surgeon: Birder Robson, MD;  Location: [facility] ORS;  Service: Ophthalmology;  Laterality: Left;  [name] 00:24.8 AP% 14.9 CDE 3.68 Fluid pack lot # [id number]   CATARACT EXTRACTION W/PHACO Right 01/25/2018   Procedure: CATARACT EXTRACTION PHACO AND INTRAOCULAR LENS PLACEMENT (IOC);  Surgeon: Birder Robson, MD;  Location: [facility] OR[facility];  Service: Ophthalmology;  Laterality: Right;  [name] 00:42 AP% 10.8 CDE 4.59 Fluid pack lot # [id number]   COLON SURGERY     CYSTOSCOPY W/ URETERAL STENT PLACEMENT Right 10/16/2017   Procedure: right  URETERAL STENT PLACEMENT,cystoscopy bilateral stent removal,rretrograde;  Surgeon: Abbie Sons, MD;  Location: [facility] OR[facility];  Service: Urology;  Laterality: Right;   CYSTOSCOPY/URETEROSCOPY/HOLMIUM LASER/STENT PLACEMENT Right 12/16/2020   Procedure: CYSTOSCOPY/URETEROSCOPY/HOLMIUM LASER/STENT PLACEMENT;  Surgeon: Abbie Sons, MD;  Location: [facility] OR[facility];  Service: Urology;  Laterality: Right;   EXTRACORPOREAL SHOCK WAVE LITHOTRIPSY Right 12/11/2020   Procedure: EXTRACORPOREAL SHOCK WAVE LITHOTRIPSY (ESWL);  Surgeon: Abbie Sons, MD;  Location: [facility];  Service: Urology;  Laterality: Right;   IR KYPHO EA ADDL LEVEL THORACIC OR LUMBAR  02/02/2021   IR KYPHO LUMBAR INC FX REDUCE BONE BX UNI/BIL CANNULATION INC/IMAGING  02/02/2021   KIDNEY STONE SURGERY     KYPHOPLASTY N/A 03/12/2021   Procedure: Hewitt Shorts, L3;  Surgeon: Hessie Knows, MD;  Location: [facility] OR[facility];  Service: Orthopedics;  Laterality: N/A;   RESECTION SOFT TISSUE TUMOR LEG / ANKLE RADICAL  jan 2009   [facility],  right thigh/knee , nonmalignant   SMALL INTESTINE SURGERY  1946   implaed on picket fence, punctured stomach   TONSILLECTOMY  FAMILY HISTORY: Family History  Problem Relation Age of Onset   Hypertension Father    Hyperlipidemia Father    Cancer Sister        thyroid - dx in late 20's    ADVANCED DIRECTIVES (Y/N):  _0 @  HEALTH  MAINTENANCE: Social History   Tobacco Use   Smoking status: Former    Types: Cigarettes    Quit date: 07/05/1965    Years since quitting: 55.7   Smokeless tobacco: Never  Vaping Use   Vaping Use: Never used  Substance Use Topics   Alcohol use: Yes    Alcohol/week: 1.0 standard drink    Types: 1 Standard drinks or equivalent per week    Comment: occassionaly   Drug use: No     Colonoscopy:  PAP:  Bone density:  Lipid panel:  Allergies  Allergen Reactions   Quinolones     Aortic dilation contraindicates FQ   Amlodipine Other (See Comments)    LE edema   Azithromycin Nausea And Vomiting   Lisinopril Cough   Morphine And Related Nausea And Vomiting    Current Facility-Administered Medications  Medication Dose Route Frequency Provider Last Rate Last Admin   atorvastatin (LIPITOR) tablet 40 mg  40 mg Oral QHS Cox, Amy N, DO   40 mg at 03/29/21 2208   baclofen (LIORESAL) tablet 10 mg  10 mg Oral TID Fritzi Mandes, MD   10 mg at 03/31/21 1631   bisacodyl (DULCOLAX) suppository 10 mg  10 mg Rectal Daily PRN Fritzi Mandes, MD   10 mg at 03/29/21 1521   carvedilol (COREG) tablet 25 mg  25 mg Oral BID WC Cox, Amy N, DO   25 mg at 03/31/21 1631   dexamethasone (DECADRON) tablet 20 mg  20 mg Oral Once Cammie Sickle, MD       dexamethasone (DECADRON) tablet 40 mg  40 mg Oral Daily Charlaine Dalton R, MD   40 mg at 03/31/21 0826   docusate sodium (COLACE) capsule 100 mg  100 mg Oral BID PRN Fritzi Mandes, MD       enoxaparin (LOVENOX) injection 75 mg  1 mg/kg Subcutaneous Q12H Fritzi Mandes, MD   75 mg at 03/31/21 0930   feeding supplement (ENSURE ENLIVE / ENSURE PLUS) liquid 237 mL  237 mL Oral BID BM Fritzi Mandes, MD   237 mL at 03/31/21 1357   gabapentin (NEURONTIN) capsule 300 mg  300 mg Oral TID Fritzi Mandes, MD   300 mg at 03/31/21 1631   hydrALAZINE (APRESOLINE) injection 10 mg  10 mg Intravenous Q4H PRN Rise Patience, MD   10 mg at 03/30/21 0559   ketorolac (TORADOL)  15 MG/ML injection 15 mg  15 mg Intravenous Q6H PRN Mansy, Jan A, MD   15 mg at 03/31/21 0410   lidocaine (LIDODERM) 5 % 1 patch  1 patch Transdermal Q24H Cox, Amy N, DO   1 patch at 03/30/21 2146   morphine 2 MG/ML injection 1 mg  1 mg Intravenous Q4H PRN Fritzi Mandes, MD   1 mg at 03/29/21 1808   multivitamin with minerals tablet 1 tablet  1 tablet Oral Daily Fritzi Mandes, MD   1 tablet at 03/31/21 0825   ondansetron (ZOFRAN) tablet 4 mg  4 mg Oral Q6H PRN Cox, Amy N, DO       Or   ondansetron (ZOFRAN) injection 4 mg  4 mg Intravenous Q6H PRN Cox, Amy N, DO  oxyCODONE (OXYCONTIN) 12 hr tablet 10 mg  10 mg Oral Once Cammie Sickle, MD       oxyCODONE (OXYCONTIN) 12 hr tablet 20 mg  20 mg Oral Q12H Charlaine Dalton R, MD   20 mg at 03/31/21 0823   oxyCODONE-acetaminophen (PERCOCET/ROXICET) 5-325 MG per tablet 1-2 tablet  1-2 tablet Oral Q4H PRN Fritzi Mandes, MD   2 tablet at 03/31/21 1356   pantoprazole (PROTONIX) EC tablet 40 mg  40 mg Oral QHS Charlaine Dalton R, MD   40 mg at 03/29/21 2208   senna-docusate (Senokot-S) tablet 1 tablet  1 tablet Oral BID Cox, Amy N, DO   1 tablet at 03/31/21 0828   traMADol (ULTRAM) tablet 50 mg  50 mg Oral Q6H PRN Fritzi Mandes, MD   50 mg at 03/27/21 1704   valACYclovir (VALTREX) tablet 500 mg  500 mg Oral Daily Charlaine Dalton R, MD   500 mg at 03/31/21 0825    OBJECTIVE: Vitals:   03/31/21 0738 03/31/21 1508  BP: (!) 129/94 127/76  Pulse: 66 (!) 57  Resp: 18 18  Temp: 98.6 F (37 C) (!) 97.5 F (36.4 C)  SpO2: 94% 95%     Body mass index is 24.51 kg/m.    ECOG FS:3 - Symptomatic, >50% confined to bed  General: Lethargic, no acute distress. Eyes: Pink conjunctiva, anicteric sclera. HEENT: Normocephalic, moist mucous membranes. Lungs: No audible wheezing or coughing. Heart: Regular rate and rhythm. Abdomen: Soft, nontender, no obvious distention. Musculoskeletal: No edema, cyanosis, or clubbing. Neuro: Lethargic, but easily  arousable. Cranial nerves grossly intact. Skin: No rashes or petechiae noted. Psych: Normal affect.   LAB RESULTS:  Lab Results  Component Value Date   NA 134 (L) 03/27/2021   K 3.5 03/27/2021   CL 102 03/27/2021   CO2 27 03/27/2021   GLUCOSE 90 03/27/2021   BUN 23 03/27/2021   CREATININE 0.94 03/27/2021   CALCIUM 8.3 (L) 03/27/2021   PROT 6.0 (L) 03/26/2021   ALBUMIN 3.8 03/26/2021   AST 41 03/26/2021   ALT 25 03/26/2021   ALKPHOS 104 03/26/2021   BILITOT 1.4 (H) 03/26/2021   GFRNONAA >60 03/27/2021   GFRAA 29 (L) 10/16/2017    Lab Results  Component Value Date   WBC 4.8 03/27/2021   NEUTROABS 5.2 03/26/2021   HGB 10.5 (L) 03/27/2021   HCT 32.5 (L) 03/27/2021   MCV 96.4 03/27/2021   PLT 212 03/27/2021     STUDIES: DG Lumbar Spine 2-3 Views  Result Date: 03/12/2021 CLINICAL DATA:  Provided history: Fracture. Additional history provided: L2-L3 kyphoplasty. Provided fluoroscopy time 2 minutes, 27 seconds. EXAM: LUMBAR SPINE - 2-3 VIEW; DG C-ARM 1-60 MIN COMPARISON:  CT abdomen/pelvis 03/11/2021. FINDINGS: AP and lateral view intraprocedural fluoroscopic images of the lumbar spine are submitted, 2 images total. On the provided images, kyphoplasty material is present within three contiguous lumbar vertebral bodies (these are presumably the L1, L2 and L3 vertebral bodies given the provided history and findings on prior imaging. However, neither the thoracolumbar nor lumbosacral junction are included in the field of view to allow exact level determination). IMPRESSION: Two intraprocedural fluoroscopic images of the lumbar spine from reported L2 and L3 kyphoplasty, as described. Electronically Signed   By: Kellie Simmering DO   On: 03/12/2021 18:03   MR THORACIC SPINE W WO CONTRAST  Result Date: 03/29/2021 CLINICAL DATA:  Initial evaluation for hematologic malignancy, staging. Multiple myeloma. EXAM: MRI THORACIC WITHOUT AND WITH CONTRAST TECHNIQUE: Multiplanar and  multiecho pulse  sequences of the thoracic spine were obtained without and with intravenous contrast. CONTRAST:  74m GADAVIST GADOBUTROL 1 MMOL/ML IV SOLN COMPARISON:  MRI of the lumbar spine from 03/27/2021 as well as previous MRI from 01/29/2021. FINDINGS: Alignment: Physiologic with preservation of the normal thoracic kyphosis. Vertebrae: Abnormal heterogeneous signal intensity seen throughout the visualized bone marrow, presumably related to history of multiple myeloma. There is new abnormal STIR signal intensity involving the T10 and T11 vertebral bodies, new as compared to prior thoracic spine MRI from 01/29/2021, suggesting progressive disease. There is an associated pathologic compression fracture at the inferior endplate of TJ18with no more than mild central height loss and trace 2 mm bony retropulsion, new from prior. The marrow edema at this level is likely at least in part related to the fracture. 1 cm STIR hyperintense lesion involving the T8 vertebral body is stable. Additional subcentimeter STIR hyperintense lesions involving the T6 and T7 vertebral bodies also similar. T9 hemangioma noted. Additional compression deformities involving the T12 through L3 vertebral bodies with sequelae of prior vertebral augmentation again noted. Mild chronic compression deformities at the superior endplates of T3 and T4 are stable. Vertebral body height otherwise maintained. Cord: Normal signal and morphology. Previously identified soft tissue density within the ventral epidural space extending from L1 through L2-3 again noted, better characterized on prior MRI of the lumbar spine. No other extra osseous extension of tumor seen within the thoracic spine. Paraspinal and other soft tissues: Unremarkable. Disc levels: Mild for age multilevel disc desiccation seen throughout the thoracic spine. No significant disc bulge or focal disc herniation. No spinal stenosis. Foramina remain patent. IMPRESSION: 1. Abnormal heterogeneous signal  intensity throughout the visualized bone marrow, consistent with history of multiple myeloma. Increased STIR signal intensity within the T10 and T11 vertebral bodies since prior thoracic spine MRI from 01/29/2021, suggesting progressive disease. 2. New pathologic compression fracture at the inferior endplate of TA41with no more than mild central height loss and trace 2 mm bony retropulsion. No stenosis. 3. Small amount of extra osseous tumor within the ventral epidural space at L1 through L2-3, better characterized on prior lumbar spine MRI. No other epidural or extraosseous tumor within the thoracic spine. 4. No significant canal or foraminal stenosis. Electronically Signed   By: BJeannine BogaM.D.   On: 03/29/2021 04:31   MR Lumbar Spine W Wo Contrast  Result Date: 03/27/2021 CLINICAL DATA:  Low back pain, history of multiple myeloma EXAM: MRI LUMBAR SPINE WITHOUT AND WITH CONTRAST TECHNIQUE: Multiplanar and multiecho pulse sequences of the lumbar spine were obtained without and with intravenous contrast. CONTRAST:  7.554mGADAVIST GADOBUTROL 1 MMOL/ML IV SOLN COMPARISON:  01/29/2021 FINDINGS: Segmentation:  Standard. Alignment:  No significant listhesis. Vertebrae: There are pathologic compression fractures at T12-L4. Progressed since the prior MRI but present on other interval imaging with exception of L4. Cement augmentation is present at T12-L3. Minor endplate retropulsion, greatest at T12 and L1. There is mildly heterogeneous marrow signal with patchy STIR hyperintensity and enhancement. New mild ventral epidural soft tissue extension is present at L1 to L2-L3. Conus medullaris and cauda equina: Conus extends to the L1-L2 level. Conus and cauda equina appear normal. No abnormal intrathecal enhancement. Paraspinal and other soft tissues: Unremarkable. Disc levels: L1-L2:  No significant canal or foraminal stenosis. L2-L3: Disc bulge. Facet arthropathy with ligamentum flavum infolding. No significant  canal or foraminal stenosis. L3-L4: Disc bulge. Facet arthropathy with ligamentum flavum infolding. No significant canal or foraminal  stenosis. L4-L5: Disc bulge. Facet arthropathy with ligamentum flavum infolding. No significant canal or foraminal stenosis. L5-S1:  No significant canal or foraminal stenosis. IMPRESSION: Pathologic compression fractures of T12-L4 with cement augmentation at T12-L3. Heterogeneous marrow signal a and enhancement likely reflecting known myeloma. There is new mild ventral epidural soft tissue extension at L1 to L2-L3. No significant canal or foraminal narrowing at any level. Electronically Signed   By: Macy Mis M.D.   On: 03/27/2021 14:39   CT ABDOMEN PELVIS W CONTRAST  Result Date: 03/26/2021 CLINICAL DATA:  Abdominal distension and abdominal pain EXAM: CT ABDOMEN AND PELVIS WITH CONTRAST TECHNIQUE: Multidetector CT imaging of the abdomen and pelvis was performed using the standard protocol following bolus administration of intravenous contrast. CONTRAST:  166m OMNIPAQUE IOHEXOL 350 MG/ML SOLN COMPARISON:  03/11/2021 FINDINGS: Lower chest: Unremarkable. Hepatobiliary: No suspicious focal abnormality within the liver parenchyma. There is no evidence for gallstones, gallbladder wall thickening, or pericholecystic fluid. No intrahepatic or extrahepatic biliary dilation. Pancreas: No focal mass lesion. No dilatation of the main duct. No intraparenchymal cyst. No peripancreatic edema. Spleen: No splenomegaly. No focal mass lesion. Adrenals/Urinary Tract: No adrenal nodule or mass. Kidneys unremarkable. No evidence for hydroureter. The urinary bladder appears normal for the degree of distention. Stomach/Bowel: Tiny hiatal hernia. Stomach otherwise unremarkable. Duodenum is normally positioned as is the ligament of Treitz. No small bowel wall thickening. No small bowel dilatation. The terminal ileum is normal. The appendix is normal. No gross colonic mass. No colonic wall  thickening. No substantial diverticular disease in the left colon. Colon is diffusely fluid-filled with fluid visible in the rectum, imaging findings that could be consistent diarrhea. No colonic wall thickening. Vascular/Lymphatic: There is moderate atherosclerotic calcification of the abdominal aorta without aneurysm. There is no gastrohepatic or hepatoduodenal ligament lymphadenopathy. No retroperitoneal or mesenteric lymphadenopathy. No pelvic sidewall lymphadenopathy. Reproductive: Prostate gland is markedly enlarged. Other: No intraperitoneal free fluid. Musculoskeletal: Mesh in the left groin region is compatible with prior herniorrhaphy. Bony mineralization is diffusely heterogeneous in this patient with a history of multiple myeloma. Patient is status post multiple level vertebral augmentation in the lower thoracic and lumbar spine. IMPRESSION: 1. No acute findings in the abdomen or pelvis. 2. Fluid in the right and transverse colon with visible fluid in the rectum. Imaging findings could be consistent with diarrhea. No evidence for colonic wall thickening to suggest colitis. 3. Marked prostatomegaly. 4. Tiny hiatal hernia. 5. Aortic Atherosclerosis (ICD10-I70.0). Electronically Signed   By: EMisty StanleyM.D.   On: 03/26/2021 18:09   CT ABDOMEN PELVIS W CONTRAST  Result Date: 03/11/2021 CLINICAL DATA:  87year old male with abdominal pain. Recent diagnosis of MGUS, concern for multiple myeloma. EXAM: CT ABDOMEN AND PELVIS WITH CONTRAST TECHNIQUE: Multidetector CT imaging of the abdomen and pelvis was performed using the standard protocol following bolus administration of intravenous contrast. CONTRAST:  1068mOMNIPAQUE IOHEXOL 300 MG/ML  SOLN COMPARISON:  Noncontrast CT Abdomen and Pelvis 01/29/2021 and earlier. Thoracic and lumbar MRI 01/29/2021. FINDINGS: Lower chest: Stable small to moderate hiatal hernia. Tortuous thoracic aorta. Calcified coronary artery atherosclerosis. No cardiomegaly or  pericardial effusion. No pleural effusion. Stable mild lung base atelectasis or scarring. Hepatobiliary: Gallbladder is at the upper limits of normal but does not appear inflamed. Negative liver. No bile duct enlargement. Pancreas: Negative. Spleen: Negative. Adrenals/Urinary Tract: Normal adrenal glands. Nonobstructed kidneys with symmetric renal enhancement and contrast excretion. There is probable small benign left renal parapelvic cyst accounting for the increased left renal  hilum soft tissue on series 7, image 26, stable since 2018. Decompressed ureters. Decompressed and negative bladder. Stomach/Bowel: Negative large bowel aside from retained stool in the right colon. Diminutive, negative appendix on coronal image 48. Flocculated material in the terminal ileum but no dilated small bowel. Aside from hiatal hernia the stomach is negative. Negative duodenum. No free air or free fluid. Vascular/Lymphatic: Aortoiliac calcified atherosclerosis. Major arterial structures remain patent in the abdomen and pelvis. Tortuous iliac arteries. No lymphadenopathy. Reproductive: Prostatomegaly. Previous left inguinal hernia repair with mesh appears stable. Otherwise negative. Other: No pelvic free fluid. Musculoskeletal: Diffuse osteopenia and heterogeneous bone mineralization similar to the CT last month, distinctly different from a 2018 CT. Interval augmentation of the T12 and L1 compression fractures. New/progressed compression fractures of L2 and L3 since last month. No significant retropulsion of bone or other complicating features. Bone mineralization is especially heterogeneous in the lumbar spine and pelvis, and these are likely pathologic fractures in the setting of MGUS/myeloma. No other acute osseous abnormality identified. IMPRESSION: 1. Diffusely abnormal bone mineralization in keeping with MGUS/Multiple Myeloma with new or progressed pathologic compression fractures of L2 and L3 since last month. Interval  augmentation of T12 and L1. 2. No other acute finding in the abdomen or pelvis. Small to moderate hiatal hernia. Prostatomegaly. Aortic Atherosclerosis (ICD10-I70.0). Electronically Signed   By: Genevie Ann M.D.   On: 03/11/2021 09:53   NM PET Image Initial (PI) Whole Body  Result Date: 03/24/2021 CLINICAL DATA:  Initial treatment strategy for concern for multiple myeloma. Gammopathy with multiple M spikes. Prior kyphoplasty 12/16/2020 at L2-3. EXAM: NUCLEAR MEDICINE PET WHOLE BODY TECHNIQUE: 9.8 mCi F-18 FDG was injected intravenously. Full-ring PET imaging was performed from the head to foot after the radiotracer. CT data was obtained and used for attenuation correction and anatomic localization. Fasting blood glucose: 91 mg/dl COMPARISON:  03/11/2021 abdominopelvic CT.  Chest CT 11/21/2017. FINDINGS: Mediastinal blood pool activity: SUV max 3.1 HEAD/NECK: No areas of abnormal hypermetabolism. Incidental CT findings: Cerebral atrophy is within normal variation for age. No cervical adenopathy. Left carotid atherosclerotic calcifications. CHEST: No thoracic nodal hypermetabolism. Distal esophageal hypermetabolism in the setting of a small hiatal hernia. This measures a S.U.V. max of 4.6, including on approximately 139/3. No pulmonary parenchymal hypermetabolism. Incidental CT findings: Aortic and coronary artery calcification. Ascending aortic dilatation again identified, including up to 4.6 cm. Mild cardiomegaly. Calcified left-sided pleural plaques. Centrilobular emphysema. No thoracic adenopathy. ABDOMEN/PELVIS: No abdominopelvic parenchymal or nodal hypermetabolism. Incidental CT findings: Normal adrenal glands. Proximal gastric underdistention. No abdominopelvic adenopathy. Moderate prostatomegaly. SKELETON: Multifocal thoracolumbar spine hypermetabolism. This is seen at the previously augmented levels and likely reactive. However, there is also hypermetabolism within the T11 vertebral body at a S.U.V. max of  5.1. Incidental CT findings: Diffuse heterogeneous osseous density, most consistent with myeloma involvement. EXTREMITIES: No areas of abnormal hypermetabolism. Incidental CT findings: Loose bodies about the right knee are likely within a popliteal cyst. Subtle cortical irregularity which is most apparent in the femoral, less so humeral shafts, suspicious for myeloma involvement. IMPRESSION: 1. Heterogeneity throughout the marrow space, most consistent with the clinical history of multiple myeloma. The only suspicious area of osseous hypermetabolism is within the T11 vertebral body. 2. No findings of extra-osseous hypermetabolic myeloma. 3. Distal esophageal hypermetabolism, suspicious for esophagitis. Concurrent small hiatal hernia. 4. Incidental findings, including: Similar ascending aortic aneurysm. Aortic atherosclerosis (ICD10-I70.0), coronary artery atherosclerosis and emphysema (ICD10-J43.9). Left-sided pleural partially calcified pleural plaques may relate to prior empyema or  hemothorax. Prostatomegaly. Electronically Signed   By: Abigail Miyamoto M.D.   On: 03/24/2021 16:05   DG Chest Portable 1 View  Result Date: 03/26/2021 CLINICAL DATA:  87 year old male with right flank pain and rib fracture. EXAM: PORTABLE CHEST 1 VIEW COMPARISON:  Chest radiograph dated 02/02/2021. FINDINGS: No focal consolidation, pleural effusion, or pneumothorax. Apparent two small adjacent calcified nodules in the left upper lobe with combined dimension of 9 mm. Attention on follow-up imaging recommended. The cardiac silhouette is within limits. No acute osseous pathology. IMPRESSION: 1. No acute cardiopulmonary process. 2. A 9 mm left upper lobe calcified nodule. Electronically Signed   By: Anner Crete M.D.   On: 03/26/2021 19:08   DG C-Arm 1-60 Min  Result Date: 03/12/2021 CLINICAL DATA:  Provided history: Fracture. Additional history provided: L2-L3 kyphoplasty. Provided fluoroscopy time 2 minutes, 27 seconds. EXAM:  LUMBAR SPINE - 2-3 VIEW; DG C-ARM 1-60 MIN COMPARISON:  CT abdomen/pelvis 03/11/2021. FINDINGS: AP and lateral view intraprocedural fluoroscopic images of the lumbar spine are submitted, 2 images total. On the provided images, kyphoplasty material is present within three contiguous lumbar vertebral bodies (these are presumably the L1, L2 and L3 vertebral bodies given the provided history and findings on prior imaging. However, neither the thoracolumbar nor lumbosacral junction are included in the field of view to allow exact level determination). IMPRESSION: Two intraprocedural fluoroscopic images of the lumbar spine from reported L2 and L3 kyphoplasty, as described. Electronically Signed   By: Kellie Simmering DO   On: 03/12/2021 18:03    ASSESSMENT: Stage III kappa chain myeloma.  PLAN:    1.  Stage III kappa chain myeloma: (11:14 translocation, high risk).  Patient was initiated on pulse dose dexamethasone 40 mg daily for 4 days as well as subcutaneous Velcade on days 1, 4, 8, and 11.  Patient received cycle 1, day 4 of treatment yesterday.  Cycle 1, day 8 should be scheduled for tomorrow.  Continue treatment as planned.  Can consider triplet therapy if patient's performance status improves. 2.  Pain: Likely secondary to myeloma.  Appreciate palliative care input.  Given patient's lethargy, will not alter narcotic regimen at this time.  Patient also receiving palliative XRT.  Has had recent kyphoplasty's as well. 3.  DVT prophylaxis: Patient at high risk for developing blood clot.  Continue Lovenox 40 mg daily. 4.  Anemia: Mild, monitor.  Will follow.  Lloyd Huger, MD   03/31/2021 6:35 PM

## 2021-03-31 NOTE — Consult Note (Signed)
[place]  Telephone:([phone] [phone]   Name: Jack [name] Date: 03/31/2021 MRN: [id number]  DOB: 08-13-1933  Patient Care Team: [name], MD as PCP - General (Family Medicine) End, [name], MD as PCP - Cardiology (Cardiology) [name], MD (Endocrinology)    REASON FOR CONSULTATION: Jack [name] is a 87 y.o. male with multiple medical problems including hypertension, CKD stage IIIb, A. fib on Eliquis, and anemia.  Patient was hospitalized 01/02/2021 to 01/07/2021 with T12 compression fracture with recommendation for conservative management including use of a TLSO brace.  Patient was hospitalized again 01/29/2021 to 02/07/2021 with recurrent back pain and MR showing T12 and new L1 compression fracture.  Patient underwent T12/L1 kyphoplasty on 02/02/2021.  There was also suspicion of a left fifth rib fracture, which occurred while transitioning from the stretcher for his kyphoplasty.  Patient had mild hypercalcemia with SPEP and UPEP concerning for possible myeloma.  Patient was referred to [facility] for work-up.  He was readmitted 03/11/2021 - 03/17/2021 with worsening back pain.  CT revealed new compression fractures of L2 and L3.  Patient underwent kyphoplasty on 6/30.  He is hospitalized again 03/26/2021 with severe back/flank pain.  MRI revealed T10 pathologic compression fracture.  He was started on treatment for multiple myeloma with dexamethasone/Velcade.  Palliative care was consulted to help address goals and manage ongoing symptoms..   SOCIAL HISTORY:     reports that he quit smoking about 55 years ago. He has never used smokeless tobacco. He reports current alcohol use of about 1.0 standard drink of alcohol per week. He reports that he does not use drugs.  Patient lives at [name] in [place] with his wife of 19 years.  Patient has a son and daughter who are also involved in his care.   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  ADVANCE DIRECTIVES:  Has ACP documents but none on file  CODE STATUS: Full code  PAST MEDICAL HISTORY: Past Medical History:  Diagnosis Date   Elevated prostate specific antigen (PSA)    has been 7 for a year    GERD (gastroesophageal reflux disease)    History of colon polyps 2008   [facility],    History of kidney stones    Hyperlipidemia    Hypertension    Prostate hypertrophy    diagnosed at age 61 due to hematospermia   Renal disorder     PAST SURGICAL HISTORY:  Past Surgical History:  Procedure Laterality Date   CATARACT EXTRACTION W/PHACO Left 01/10/2018   Procedure: CATARACT EXTRACTION PHACO AND INTRAOCULAR LENS PLACEMENT ([place]);  Surgeon: Birder Robson, MD;  Location: [facility] ORS;  Service: Ophthalmology;  Laterality: Left;  [name] 00:24.8 AP% 14.9 CDE 3.68 Fluid pack lot # [id number]   CATARACT EXTRACTION W/PHACO Right 01/25/2018   Procedure: CATARACT EXTRACTION PHACO AND INTRAOCULAR LENS PLACEMENT ([facility]);  Surgeon: Birder Robson, MD;  Location: [facility] ORS;  Service: Ophthalmology;  Laterality: Right;  [name] 00:42 AP% 10.8 CDE 4.59 Fluid pack lot # [id number]   COLON SURGERY     CYSTOSCOPY W/ URETERAL STENT PLACEMENT Right 10/16/2017   Procedure: right  URETERAL STENT PLACEMENT,cystoscopy bilateral stent removal,rretrograde;  Surgeon: Abbie Sons, MD;  Location: [facility] ORS;  Service: Urology;  Laterality: Right;   CYSTOSCOPY/URETEROSCOPY/HOLMIUM LASER/STENT PLACEMENT Right 12/16/2020   Procedure: CYSTOSCOPY/URETEROSCOPY/HOLMIUM LASER/STENT PLACEMENT;  Surgeon: Abbie Sons, MD;  Location: [facility] ORS;  Service: Urology;  Laterality: Right;   EXTRACORPOREAL SHOCK WAVE LITHOTRIPSY Right 12/11/2020   Procedure: EXTRACORPOREAL SHOCK WAVE LITHOTRIPSY (ESWL);  Surgeon: Abbie Sons, MD;  Location: [facility] ORS;  Service: Urology;  Laterality: Right;   IR KYPHO EA ADDL LEVEL THORACIC OR LUMBAR  02/02/2021    IR KYPHO LUMBAR INC FX REDUCE BONE BX UNI/BIL CANNULATION INC/IMAGING  02/02/2021   KIDNEY STONE SURGERY     KYPHOPLASTY N/A 03/12/2021   Procedure: Hewitt Shorts, L3;  Surgeon: Hessie Knows, MD;  Location: [facility] ORS;  Service: Orthopedics;  Laterality: N/A;   RESECTION SOFT TISSUE TUMOR LEG / ANKLE RADICAL  jan 2009   [facility],  right thigh/knee , nonmalignant   SMALL INTESTINE SURGERY  1946   implaed on picket fence, punctured stomach   TONSILLECTOMY      HEMATOLOGY/ONCOLOGY HISTORY:  Oncology History  Multiple myeloma not having achieved remission ([place])  03/12/2021 Initial Diagnosis   Kappa light chain myeloma ([place])   03/27/2021 -  Chemotherapy    Patient is on Treatment Plan: MYELOMA NON-TRANSPLANT CANDIDATES VRD SQ Q21D         ALLERGIES:  is allergic to quinolones, amlodipine, azithromycin, lisinopril, and morphine and related.  MEDICATIONS:  Current Facility-Administered Medications  Medication Dose Route Frequency Provider Last Rate Last Admin   atorvastatin (LIPITOR) tablet 40 mg  40 mg Oral QHS Cox, Amy N, DO   40 mg at 03/29/21 2208   baclofen (LIORESAL) tablet 10 mg  10 mg Oral TID Fritzi Mandes, MD   10 mg at 03/31/21 0826   bisacodyl (DULCOLAX) suppository 10 mg  10 mg Rectal Daily PRN Fritzi Mandes, MD   10 mg at 03/29/21 1521   carvedilol (COREG) tablet 25 mg  25 mg Oral BID WC Cox, Amy N, DO   25 mg at 03/31/21 0825   dexamethasone (DECADRON) tablet 20 mg  20 mg Oral Once Cammie Sickle, MD       dexamethasone (DECADRON) tablet 40 mg  40 mg Oral Daily Charlaine Dalton R, MD   40 mg at 03/31/21 0826   docusate sodium (COLACE) capsule 100 mg  100 mg Oral BID PRN Fritzi Mandes, MD       enoxaparin (LOVENOX) injection 75 mg  1 mg/kg Subcutaneous Q12H Fritzi Mandes, MD   75 mg at 03/31/21 0930   feeding supplement (ENSURE ENLIVE / ENSURE PLUS) liquid 237 mL  237 mL Oral BID BM Fritzi Mandes, MD   237 mL at 03/31/21 1357   gabapentin (NEURONTIN) capsule 300 mg  300 mg  Oral TID Fritzi Mandes, MD   300 mg at 03/31/21 0824   hydrALAZINE (APRESOLINE) injection 10 mg  10 mg Intravenous Q4H PRN Rise Patience, MD   10 mg at 03/30/21 0559   ketorolac (TORADOL) 15 MG/ML injection 15 mg  15 mg Intravenous Q6H PRN Mansy, Jan A, MD   15 mg at 03/31/21 0410   lidocaine (LIDODERM) 5 % 1 patch  1 patch Transdermal Q24H Cox, Amy N, DO   1 patch at 03/30/21 2146   morphine 2 MG/ML injection 1 mg  1 mg Intravenous Q4H PRN Fritzi Mandes, MD   1 mg at 03/29/21 1808   multivitamin with minerals tablet 1 tablet  1 tablet Oral Daily Fritzi Mandes, MD   1 tablet at 03/31/21 0825   ondansetron (ZOFRAN) tablet 4 mg  4 mg Oral Q6H PRN Cox, Amy N, DO       Or   ondansetron (ZOFRAN) injection 4  mg  4 mg Intravenous Q6H PRN Cox, Amy N, DO       oxyCODONE (OXYCONTIN) 12 hr tablet 10 mg  10 mg Oral Once Cammie Sickle, MD       oxyCODONE (OXYCONTIN) 12 hr tablet 20 mg  20 mg Oral Q12H Cammie Sickle, MD   20 mg at 03/31/21 0823   oxyCODONE-acetaminophen (PERCOCET/ROXICET) 5-325 MG per tablet 1-2 tablet  1-2 tablet Oral Q4H PRN Fritzi Mandes, MD   2 tablet at 03/31/21 1356   pantoprazole (PROTONIX) EC tablet 40 mg  40 mg Oral QHS Charlaine Dalton R, MD   40 mg at 03/29/21 2208   polyethylene glycol (MIRALAX / GLYCOLAX) packet 17 g  17 g Oral BID Fritzi Mandes, MD   17 g at 03/31/21 9826   senna-docusate (Senokot-S) tablet 1 tablet  1 tablet Oral BID Cox, Amy N, DO   1 tablet at 03/31/21 0828   traMADol (ULTRAM) tablet 50 mg  50 mg Oral Q6H PRN Fritzi Mandes, MD   50 mg at 03/27/21 1704   valACYclovir (VALTREX) tablet 500 mg  500 mg Oral Daily Charlaine Dalton R, MD   500 mg at 03/31/21 0825    VITAL SIGNS: BP (!) 129/94 (BP Location: Left Arm)   Pulse 66   Temp 98.6 F (37 C)   Resp 18   Ht _0  (1.753 m) Comment: per chart review  Wt 166 lb 0.1 oz (75.3 kg)   SpO2 94%   BMI 24.51 kg/m  Filed Weights   03/26/21 1536 03/26/21 2207  Weight: 180 lb (81.6 kg) 166 lb  0.1 oz (75.3 kg)    Estimated body mass index is 24.51 kg/m as calculated from the following:   Height as of this encounter: _1  (1.753 m).   Weight as of this encounter: 166 lb 0.1 oz (75.3 kg).  LABS: CBC:    Component Value Date/Time   WBC 4.8 03/27/2021 0646   HGB 10.5 (L) 03/27/2021 0646   HGB 11.0 (L) 02/24/2021 1014   HCT 32.5 (L) 03/27/2021 0646   HCT 35.3 (L) 02/24/2021 1014   PLT 212 03/27/2021 0646   PLT 249 02/24/2021 1014   MCV 96.4 03/27/2021 0646   MCV 96 02/24/2021 1014   MCV 90 01/24/2013 0819   NEUTROABS 5.2 03/26/2021 1549   NEUTROABS 7.4 (H) 01/24/2013 0819   LYMPHSABS 1.4 03/26/2021 1549   LYMPHSABS 1.2 01/24/2013 0819   MONOABS 0.3 03/26/2021 1549   MONOABS 0.3 01/24/2013 0819   EOSABS 0.0 03/26/2021 1549   EOSABS 0.0 01/24/2013 0819   BASOSABS 0.0 03/26/2021 1549   BASOSABS 0.0 01/24/2013 0819   Comprehensive Metabolic Panel:    Component Value Date/Time   NA 134 (L) 03/27/2021 0646   NA 136 02/24/2021 1014   NA 138 01/24/2013 0819   K 3.5 03/27/2021 0646   K 3.8 01/24/2013 0819   CL 102 03/27/2021 0646   CL 105 01/24/2013 0819   CO2 27 03/27/2021 0646   CO2 25 01/24/2013 0819   BUN 23 03/27/2021 0646   BUN 23 02/24/2021 1014   BUN 20 (H) 01/24/2013 0819   CREATININE 0.94 03/27/2021 0646   CREATININE 1.52 (H) 01/24/2013 0819   GLUCOSE 90 03/27/2021 0646   GLUCOSE 171 (H) 01/24/2013 0819   CALCIUM 8.3 (L) 03/27/2021 0646   CALCIUM 10.4 (H) 02/19/2021 1229   AST 41 03/26/2021 1549   AST 18 01/24/2013 0819   ALT 25 03/26/2021 1549  ALT 15 01/24/2013 0819   ALKPHOS 104 03/26/2021 1549   ALKPHOS 74 01/24/2013 0819   BILITOT 1.4 (H) 03/26/2021 1549   BILITOT 0.4 02/24/2021 1014   BILITOT 0.5 01/24/2013 0819   PROT 6.0 (L) 03/26/2021 1549   PROT 6.2 02/24/2021 1014   PROT 6.3 (L) 01/24/2013 0819   ALBUMIN 3.8 03/26/2021 1549   ALBUMIN 4.5 02/24/2021 1014   ALBUMIN 3.6 01/24/2013 0819    RADIOGRAPHIC STUDIES: DG Lumbar Spine 2-3  Views  Result Date: 03/12/2021 CLINICAL DATA:  Provided history: Fracture. Additional history provided: L2-L3 kyphoplasty. Provided fluoroscopy time 2 minutes, 27 seconds. EXAM: LUMBAR SPINE - 2-3 VIEW; DG C-ARM 1-60 MIN COMPARISON:  CT abdomen/pelvis 03/11/2021. FINDINGS: AP and lateral view intraprocedural fluoroscopic images of the lumbar spine are submitted, 2 images total. On the provided images, kyphoplasty material is present within three contiguous lumbar vertebral bodies (these are presumably the L1, L2 and L3 vertebral bodies given the provided history and findings on prior imaging. However, neither the thoracolumbar nor lumbosacral junction are included in the field of view to allow exact level determination). IMPRESSION: Two intraprocedural fluoroscopic images of the lumbar spine from reported L2 and L3 kyphoplasty, as described. Electronically Signed   By: Kellie Simmering DO   On: 03/12/2021 18:03   MR THORACIC SPINE W WO CONTRAST  Result Date: 03/29/2021 CLINICAL DATA:  Initial evaluation for hematologic malignancy, staging. Multiple myeloma. EXAM: MRI THORACIC WITHOUT AND WITH CONTRAST TECHNIQUE: Multiplanar and multiecho pulse sequences of the thoracic spine were obtained without and with intravenous contrast. CONTRAST:  41m GADAVIST GADOBUTROL 1 MMOL/ML IV SOLN COMPARISON:  MRI of the lumbar spine from 03/27/2021 as well as previous MRI from 01/29/2021. FINDINGS: Alignment: Physiologic with preservation of the normal thoracic kyphosis. Vertebrae: Abnormal heterogeneous signal intensity seen throughout the visualized bone marrow, presumably related to history of multiple myeloma. There is new abnormal STIR signal intensity involving the T10 and T11 vertebral bodies, new as compared to prior thoracic spine MRI from 01/29/2021, suggesting progressive disease. There is an associated pathologic compression fracture at the inferior endplate of TP54with no more than mild central height loss and trace 2  mm bony retropulsion, new from prior. The marrow edema at this level is likely at least in part related to the fracture. 1 cm STIR hyperintense lesion involving the T8 vertebral body is stable. Additional subcentimeter STIR hyperintense lesions involving the T6 and T7 vertebral bodies also similar. T9 hemangioma noted. Additional compression deformities involving the T12 through L3 vertebral bodies with sequelae of prior vertebral augmentation again noted. Mild chronic compression deformities at the superior endplates of T3 and T4 are stable. Vertebral body height otherwise maintained. Cord: Normal signal and morphology. Previously identified soft tissue density within the ventral epidural space extending from L1 through L2-3 again noted, better characterized on prior MRI of the lumbar spine. No other extra osseous extension of tumor seen within the thoracic spine. Paraspinal and other soft tissues: Unremarkable. Disc levels: Mild for age multilevel disc desiccation seen throughout the thoracic spine. No significant disc bulge or focal disc herniation. No spinal stenosis. Foramina remain patent. IMPRESSION: 1. Abnormal heterogeneous signal intensity throughout the visualized bone marrow, consistent with history of multiple myeloma. Increased STIR signal intensity within the T10 and T11 vertebral bodies since prior thoracic spine MRI from 01/29/2021, suggesting progressive disease. 2. New pathologic compression fracture at the inferior endplate of TS56with no more than mild central height loss and trace 2 mm bony retropulsion.  No stenosis. 3. Small amount of extra osseous tumor within the ventral epidural space at L1 through L2-3, better characterized on prior lumbar spine MRI. No other epidural or extraosseous tumor within the thoracic spine. 4. No significant canal or foraminal stenosis. Electronically Signed   By: Jeannine Boga M.D.   On: 03/29/2021 04:31   MR Lumbar Spine W Wo Contrast  Result Date:  03/27/2021 CLINICAL DATA:  Low back pain, history of multiple myeloma EXAM: MRI LUMBAR SPINE WITHOUT AND WITH CONTRAST TECHNIQUE: Multiplanar and multiecho pulse sequences of the lumbar spine were obtained without and with intravenous contrast. CONTRAST:  7.4m GADAVIST GADOBUTROL 1 MMOL/ML IV SOLN COMPARISON:  01/29/2021 FINDINGS: Segmentation:  Standard. Alignment:  No significant listhesis. Vertebrae: There are pathologic compression fractures at T12-L4. Progressed since the prior MRI but present on other interval imaging with exception of L4. Cement augmentation is present at T12-L3. Minor endplate retropulsion, greatest at T12 and L1. There is mildly heterogeneous marrow signal with patchy STIR hyperintensity and enhancement. New mild ventral epidural soft tissue extension is present at L1 to L2-L3. Conus medullaris and cauda equina: Conus extends to the L1-L2 level. Conus and cauda equina appear normal. No abnormal intrathecal enhancement. Paraspinal and other soft tissues: Unremarkable. Disc levels: L1-L2:  No significant canal or foraminal stenosis. L2-L3: Disc bulge. Facet arthropathy with ligamentum flavum infolding. No significant canal or foraminal stenosis. L3-L4: Disc bulge. Facet arthropathy with ligamentum flavum infolding. No significant canal or foraminal stenosis. L4-L5: Disc bulge. Facet arthropathy with ligamentum flavum infolding. No significant canal or foraminal stenosis. L5-S1:  No significant canal or foraminal stenosis. IMPRESSION: Pathologic compression fractures of T12-L4 with cement augmentation at T12-L3. Heterogeneous marrow signal a and enhancement likely reflecting known myeloma. There is new mild ventral epidural soft tissue extension at L1 to L2-L3. No significant canal or foraminal narrowing at any level. Electronically Signed   By: PMacy MisM.D.   On: 03/27/2021 14:39   CT ABDOMEN PELVIS W CONTRAST  Result Date: 03/26/2021 CLINICAL DATA:  Abdominal distension and  abdominal pain EXAM: CT ABDOMEN AND PELVIS WITH CONTRAST TECHNIQUE: Multidetector CT imaging of the abdomen and pelvis was performed using the standard protocol following bolus administration of intravenous contrast. CONTRAST:  1087mOMNIPAQUE IOHEXOL 350 MG/ML SOLN COMPARISON:  03/11/2021 FINDINGS: Lower chest: Unremarkable. Hepatobiliary: No suspicious focal abnormality within the liver parenchyma. There is no evidence for gallstones, gallbladder wall thickening, or pericholecystic fluid. No intrahepatic or extrahepatic biliary dilation. Pancreas: No focal mass lesion. No dilatation of the main duct. No intraparenchymal cyst. No peripancreatic edema. Spleen: No splenomegaly. No focal mass lesion. Adrenals/Urinary Tract: No adrenal nodule or mass. Kidneys unremarkable. No evidence for hydroureter. The urinary bladder appears normal for the degree of distention. Stomach/Bowel: Tiny hiatal hernia. Stomach otherwise unremarkable. Duodenum is normally positioned as is the ligament of Treitz. No small bowel wall thickening. No small bowel dilatation. The terminal ileum is normal. The appendix is normal. No gross colonic mass. No colonic wall thickening. No substantial diverticular disease in the left colon. Colon is diffusely fluid-filled with fluid visible in the rectum, imaging findings that could be consistent diarrhea. No colonic wall thickening. Vascular/Lymphatic: There is moderate atherosclerotic calcification of the abdominal aorta without aneurysm. There is no gastrohepatic or hepatoduodenal ligament lymphadenopathy. No retroperitoneal or mesenteric lymphadenopathy. No pelvic sidewall lymphadenopathy. Reproductive: Prostate gland is markedly enlarged. Other: No intraperitoneal free fluid. Musculoskeletal: Mesh in the left groin region is compatible with prior herniorrhaphy. Bony mineralization is diffusely heterogeneous  in this patient with a history of multiple myeloma. Patient is status post multiple level  vertebral augmentation in the lower thoracic and lumbar spine. IMPRESSION: 1. No acute findings in the abdomen or pelvis. 2. Fluid in the right and transverse colon with visible fluid in the rectum. Imaging findings could be consistent with diarrhea. No evidence for colonic wall thickening to suggest colitis. 3. Marked prostatomegaly. 4. Tiny hiatal hernia. 5. Aortic Atherosclerosis (ICD10-I70.0). Electronically Signed   By: Misty Stanley M.D.   On: 03/26/2021 18:09   CT ABDOMEN PELVIS W CONTRAST  Result Date: 03/11/2021 CLINICAL DATA:  87 year old male with abdominal pain. Recent diagnosis of MGUS, concern for multiple myeloma. EXAM: CT ABDOMEN AND PELVIS WITH CONTRAST TECHNIQUE: Multidetector CT imaging of the abdomen and pelvis was performed using the standard protocol following bolus administration of intravenous contrast. CONTRAST:  165m OMNIPAQUE IOHEXOL 300 MG/ML  SOLN COMPARISON:  Noncontrast CT Abdomen and Pelvis 01/29/2021 and earlier. Thoracic and lumbar MRI 01/29/2021. FINDINGS: Lower chest: Stable small to moderate hiatal hernia. Tortuous thoracic aorta. Calcified coronary artery atherosclerosis. No cardiomegaly or pericardial effusion. No pleural effusion. Stable mild lung base atelectasis or scarring. Hepatobiliary: Gallbladder is at the upper limits of normal but does not appear inflamed. Negative liver. No bile duct enlargement. Pancreas: Negative. Spleen: Negative. Adrenals/Urinary Tract: Normal adrenal glands. Nonobstructed kidneys with symmetric renal enhancement and contrast excretion. There is probable small benign left renal parapelvic cyst accounting for the increased left renal hilum soft tissue on series 7, image 26, stable since 2018. Decompressed ureters. Decompressed and negative bladder. Stomach/Bowel: Negative large bowel aside from retained stool in the right colon. Diminutive, negative appendix on coronal image 48. Flocculated material in the terminal ileum but no dilated small  bowel. Aside from hiatal hernia the stomach is negative. Negative duodenum. No free air or free fluid. Vascular/Lymphatic: Aortoiliac calcified atherosclerosis. Major arterial structures remain patent in the abdomen and pelvis. Tortuous iliac arteries. No lymphadenopathy. Reproductive: Prostatomegaly. Previous left inguinal hernia repair with mesh appears stable. Otherwise negative. Other: No pelvic free fluid. Musculoskeletal: Diffuse osteopenia and heterogeneous bone mineralization similar to the CT last month, distinctly different from a 2018 CT. Interval augmentation of the T12 and L1 compression fractures. New/progressed compression fractures of L2 and L3 since last month. No significant retropulsion of bone or other complicating features. Bone mineralization is especially heterogeneous in the lumbar spine and pelvis, and these are likely pathologic fractures in the setting of MGUS/myeloma. No other acute osseous abnormality identified. IMPRESSION: 1. Diffusely abnormal bone mineralization in keeping with MGUS/Multiple Myeloma with new or progressed pathologic compression fractures of L2 and L3 since last month. Interval augmentation of T12 and L1. 2. No other acute finding in the abdomen or pelvis. Small to moderate hiatal hernia. Prostatomegaly. Aortic Atherosclerosis (ICD10-I70.0). Electronically Signed   By: HGenevie AnnM.D.   On: 03/11/2021 09:53   NM PET Image Initial (PI) Whole Body  Result Date: 03/24/2021 CLINICAL DATA:  Initial treatment strategy for concern for multiple myeloma. Gammopathy with multiple M spikes. Prior kyphoplasty 12/16/2020 at L2-3. EXAM: NUCLEAR MEDICINE PET WHOLE BODY TECHNIQUE: 9.8 mCi F-18 FDG was injected intravenously. Full-ring PET imaging was performed from the head to foot after the radiotracer. CT data was obtained and used for attenuation correction and anatomic localization. Fasting blood glucose: 91 mg/dl COMPARISON:  03/11/2021 abdominopelvic CT.  Chest CT 11/21/2017.  FINDINGS: Mediastinal blood pool activity: SUV max 3.1 HEAD/NECK: No areas of abnormal hypermetabolism. Incidental CT findings: Cerebral atrophy  is within normal variation for age. No cervical adenopathy. Left carotid atherosclerotic calcifications. CHEST: No thoracic nodal hypermetabolism. Distal esophageal hypermetabolism in the setting of a small hiatal hernia. This measures a S.U.V. max of 4.6, including on approximately 139/3. No pulmonary parenchymal hypermetabolism. Incidental CT findings: Aortic and coronary artery calcification. Ascending aortic dilatation again identified, including up to 4.6 cm. Mild cardiomegaly. Calcified left-sided pleural plaques. Centrilobular emphysema. No thoracic adenopathy. ABDOMEN/PELVIS: No abdominopelvic parenchymal or nodal hypermetabolism. Incidental CT findings: Normal adrenal glands. Proximal gastric underdistention. No abdominopelvic adenopathy. Moderate prostatomegaly. SKELETON: Multifocal thoracolumbar spine hypermetabolism. This is seen at the previously augmented levels and likely reactive. However, there is also hypermetabolism within the T11 vertebral body at a S.U.V. max of 5.1. Incidental CT findings: Diffuse heterogeneous osseous density, most consistent with myeloma involvement. EXTREMITIES: No areas of abnormal hypermetabolism. Incidental CT findings: Loose bodies about the right knee are likely within a popliteal cyst. Subtle cortical irregularity which is most apparent in the femoral, less so humeral shafts, suspicious for myeloma involvement. IMPRESSION: 1. Heterogeneity throughout the marrow space, most consistent with the clinical history of multiple myeloma. The only suspicious area of osseous hypermetabolism is within the T11 vertebral body. 2. No findings of extra-osseous hypermetabolic myeloma. 3. Distal esophageal hypermetabolism, suspicious for esophagitis. Concurrent small hiatal hernia. 4. Incidental findings, including: Similar ascending aortic  aneurysm. Aortic atherosclerosis (ICD10-I70.0), coronary artery atherosclerosis and emphysema (ICD10-J43.9). Left-sided pleural partially calcified pleural plaques may relate to prior empyema or hemothorax. Prostatomegaly. Electronically Signed   By: Abigail Miyamoto M.D.   On: 03/24/2021 16:05   DG Chest Portable 1 View  Result Date: 03/26/2021 CLINICAL DATA:  87 year old male with right flank pain and rib fracture. EXAM: PORTABLE CHEST 1 VIEW COMPARISON:  Chest radiograph dated 02/02/2021. FINDINGS: No focal consolidation, pleural effusion, or pneumothorax. Apparent two small adjacent calcified nodules in the left upper lobe with combined dimension of 9 mm. Attention on follow-up imaging recommended. The cardiac silhouette is within limits. No acute osseous pathology. IMPRESSION: 1. No acute cardiopulmonary process. 2. A 9 mm left upper lobe calcified nodule. Electronically Signed   By: Anner Crete M.D.   On: 03/26/2021 19:08   DG C-Arm 1-60 Min  Result Date: 03/12/2021 CLINICAL DATA:  Provided history: Fracture. Additional history provided: L2-L3 kyphoplasty. Provided fluoroscopy time 2 minutes, 27 seconds. EXAM: LUMBAR SPINE - 2-3 VIEW; DG C-ARM 1-60 MIN COMPARISON:  CT abdomen/pelvis 03/11/2021. FINDINGS: AP and lateral view intraprocedural fluoroscopic images of the lumbar spine are submitted, 2 images total. On the provided images, kyphoplasty material is present within three contiguous lumbar vertebral bodies (these are presumably the L1, L2 and L3 vertebral bodies given the provided history and findings on prior imaging. However, neither the thoracolumbar nor lumbosacral junction are included in the field of view to allow exact level determination). IMPRESSION: Two intraprocedural fluoroscopic images of the lumbar spine from reported L2 and L3 kyphoplasty, as described. Electronically Signed   By: Kellie Simmering DO   On: 03/12/2021 18:03    PERFORMANCE STATUS (ECOG) : 2 - Symptomatic, <50% confined  to bed  Review of Systems Unless otherwise noted, a complete review of systems is negative.  Physical Exam General: NAD Cardiovascular: regular rate and rhythm Pulmonary: Unlabored Extremities: no edema, no joint deformities Skin: no rashes Neurological: Weakness but otherwise nonfocal  IMPRESSION: Patient well-known to me from the clinic.  He was started on Velcade, which he has received twice over the weekend.  He seems to be tolerating  well without significant symptomatic burden.  Patient is pending XRT.  Patient does continue to endorse back pain, which he currently rates as 5 out of 10.  His overall as needed use of pain medications has lessened over the past 24 to 48 hours.  I had previously discussed the option of liberalizing his OxyContin to every 8 hours.  However, given his infrequent use of Percocet and the fact the patient is somewhat drowsy today, will hold on making any changes to his pain regimen.  Discussed disposition.  Wife would be interested in discussing options for rehab although they have had several negative experiences recently had rehab facilities.    PLAN: -Continue current scope of treatment -Pain control with Percocet/OxyContin -Daily bowel regimen -Will follow  Case and plan discussed with Dr Grayland Ormond   Time Total: 60 minutes  Visit consisted of counseling and education dealing with the complex and emotionally intense issues of symptom management and palliative care in the setting of serious and potentially life-threatening illness.Greater than 50%  of this time was spent counseling and coordinating care related to the above assessment and plan.  Signed by: Altha Harm, PhD, NP-C

## 2021-04-01 ENCOUNTER — Ambulatory Visit: Payer: PPO

## 2021-04-01 DIAGNOSIS — J9601 Acute respiratory failure with hypoxia: Secondary | ICD-10-CM | POA: Diagnosis not present

## 2021-04-01 LAB — BASIC METABOLIC PANEL
Anion gap: 9 (ref 5–15)
BUN: 47 mg/dL — ABNORMAL HIGH (ref 8–23)
CO2: 27 mmol/L (ref 22–32)
Calcium: 9.3 mg/dL (ref 8.9–10.3)
Chloride: 103 mmol/L (ref 98–111)
Creatinine, Ser: 1.06 mg/dL (ref 0.61–1.24)
GFR, Estimated: >60 mL/min (ref >60)
Glucose, Bld: 136 mg/dL — ABNORMAL HIGH (ref 70–99)
Potassium: 4.4 mmol/L (ref 3.5–5.1)
Sodium: 139 mmol/L (ref 135–145)

## 2021-04-01 LAB — CBC
HCT: 32.9 % — ABNORMAL LOW (ref 39.0–52.0)
Hemoglobin: 11 g/dL — ABNORMAL LOW (ref 13.0–17.0)
MCH: 31.6 pg (ref 26.0–34.0)
MCHC: 33.4 g/dL (ref 30.0–36.0)
MCV: 94.5 fL (ref 80.0–100.0)
Platelets: 170 10*3/uL (ref 150–400)
RBC: 3.48 MIL/uL — ABNORMAL LOW (ref 4.22–5.81)
RDW: 16.2 % — ABNORMAL HIGH (ref 11.5–15.5)
WBC: 7.5 10*3/uL (ref 4.0–10.5)
nRBC: 0.3 % — ABNORMAL HIGH (ref 0.0–0.2)

## 2021-04-01 LAB — MAGNESIUM: Magnesium: 2.2 mg/dL (ref 1.7–2.4)

## 2021-04-01 MED ORDER — MAGNESIUM CITRATE PO SOLN: 1.0000 | Freq: Once | ORAL | Status: DC

## 2021-04-01 MED ORDER — APIXABAN 2.5 MG PO TABS
2.5000 mg | ORAL_TABLET | Freq: Two times a day (BID) | ORAL | Status: DC
  Administered 2021-04-01 – 2021-04-02 (×2): 2.5 mg via ORAL
  Filled 2021-04-01 (×2): qty 1

## 2021-04-01 MED ORDER — POLYETHYLENE GLYCOL 3350 17 G PO PACK
34.0000 g | PACK | ORAL | Status: DC
  Administered 2021-04-01 (×3): 34 g via ORAL
  Filled 2021-04-01 (×3): qty 2

## 2021-04-01 NOTE — Progress Notes (Signed)
Physical Therapy Treatment Patient Details Name: Jack [name] MRN: [id number] DOB: 04/29/1933 Today's Date: 04/01/2021    History of Present Illness Pt admitted 03/26/21 for multiple myloma and uncontrolled pain; pt starte chemotherapy on this date in the hospital. Pt with recent hospitalization 2 weeks ago for L2-L3 kyphoplasty and wears TLSO for OOB mobility. PMH includes:  gastric reflux, AFIB, hypertension, hyperlipidemia, recent T12/L1 kyphoplasty, and multiple myeloma.    PT Comments    Pt supine in bed with spouse in room. Agreeable to PT services. Pt remains supervision for bed mobility with good understanding of log roll technique and able to scoot to sitting EOB with bed rail support. Good understanding on TLSO donning. Supervision STS to RW with bed elevated due to pain with sitting EOB, however pt requesting t/f to recliner despite PT encouragement. Pt displays safe RW use/sequencing and hand placement t/f into recliner requiring min VC's for safe hand placement throughout. Pt most limited by his pain levels requiring extra time to complete basic functional mobility movements but requires no physical assist. Current D/c recs remain appropriate.    Follow Up Recommendations  Home health PT;Supervision/Assistance - 24 hour     Equipment Recommendations  None recommended by PT    Recommendations for Other Services       Precautions / Restrictions Precautions Precautions: Fall Required Braces or Orthoses: Spinal Brace Spinal Brace: Thoracolumbosacral orthotic Restrictions Weight Bearing Restrictions: No    Mobility  Bed Mobility Overal bed mobility: Needs Assistance Bed Mobility: Rolling;Sidelying to Sit Rolling: Supervision Sidelying to sit: Supervision Supine to sit: Supervision     General bed mobility comments: Supervision for safety to sit EOB via log roll technique; increased time/effort secondary to pain    Transfers Overall transfer level: Needs  assistance Equipment used: Rolling walker (2 wheeled) Transfers: Sit to/from Stand Sit to Stand: Supervision;From elevated surface         General transfer comment: Able to stand to RW with elevated bed. Cuing for safe hand placement.  Ambulation/Gait             General Gait Details: Pt denying gait at this time due to pain. Wishes to t/f to recliner.   Stairs             Wheelchair Mobility    Modified Rankin (Stroke Patients Only)       Balance Overall balance assessment: Needs assistance Sitting-balance support: Feet supported;No upper extremity supported Sitting balance-Leahy Scale: Fair Sitting balance - Comments: ABle to assist in donning TLSO in sitting with no UE support. Good understanding how to don.   Standing balance support: Bilateral upper extremity supported;During functional activity Standing balance-Leahy Scale: Fair Standing balance comment: with RW                            Cognition Arousal/Alertness: Awake/alert Behavior During Therapy: WFL for tasks assessed/performed Overall Cognitive Status: Within Functional Limits for tasks assessed                                        Exercises      General Comments        Pertinent Vitals/Pain Pain Assessment: 0-10 Pain Score: 8  Pain Location: back Pain Intervention(s): Limited activity within patient's tolerance;Monitored during session;Premedicated before session;Repositioned    Home Living Family/patient expects to be discharged to:: Private  residence Living Arrangements: Spouse/significant other Available Help at Discharge: Family;Available 24 hours/day Type of Home: House Home Access: Stairs to enter Entrance Stairs-Rails: None Home Layout: One level Home Equipment: Cane - single point;Shower seat;Grab bars - tub/shower;Hand held shower head;Toilet riser;Walker - 4 wheels;Walker - 2 wheels Additional Comments: lift chair/recliner    Prior  Function Level of Independence: Independent with assistive device(s)      Comments: Since last hospital visit, pt notes that he was Ind with ADL's, but required assist for showering and IADL's due to increased pain levels. Used rollator for household ambulation. Needs assist to don brace. Hasn't drove since March 2022.   PT Goals (current goals can now be found in the care plan section) Acute Rehab PT Goals Patient Stated Goal: to go home PT Goal Formulation: With patient Time For Goal Achievement: 04/11/21 Potential to Achieve Goals: Good    Frequency    Min 2X/week      PT Plan      Co-evaluation              AM-PAC PT "6 Clicks" Mobility   Outcome Measure  Help needed turning from your back to your side while in a flat bed without using bedrails?: A Little Help needed moving from lying on your back to sitting on the side of a flat bed without using bedrails?: A Little Help needed moving to and from a bed to a chair (including a wheelchair)?: A Little Help needed standing up from a chair using your arms (e.g., wheelchair or bedside chair)?: A Little Help needed to walk in hospital room?: A Lot Help needed climbing 3-5 steps with a railing? : A Lot 6 Click Score: 16    End of Session Equipment Utilized During Treatment: Gait belt Activity Tolerance: Patient tolerated treatment well;Patient limited by pain Patient left: in chair;with call bell/phone within reach;with family/visitor present Nurse Communication: Mobility status PT Visit Diagnosis: Other abnormalities of gait and mobility (R26.89);Muscle weakness (generalized) (M62.81);Difficulty in walking, not elsewhere classified (R26.2);Pain;Unsteadiness on feet (R26.81) Pain - Right/Left:  (back)     Time: 3491-7915 PT Time Calculation (min) (ACUTE ONLY): 22 min  Charges:  $Therapeutic Activity: 8-22 mins                    Nataleah Scioneaux M. Fairly IV, PT, DPT Physical Therapist- [facility]  04/01/2021, 12:14 PM

## 2021-04-01 NOTE — Progress Notes (Signed)
PROGRESS NOTE    Jack [name]  LXB:[id number] DOB: March 05, 1933 DOA: 03/26/2021 PCP: [name], MD  [id number]   Assessment & Plan:   Principal Problem:   Acute hypoxemic respiratory failure ([place]) Active Problems:   Essential hypertension   GERD   Prostate hypertrophy   Hyperlipidemia   Constipation   Multiple myeloma not having achieved remission (HCC)   Right flank pain   Compression fracture of body of thoracic vertebra (HCC)   Jack [name] is a 87 y.o. male with medical history significant for paroxysmal atrial fibrillation, PACs, coronary calcium and aortic atherosclerosis, diastolic dysfunction, C1UL, GERD, aortic root dilatation, ascending aortic dilatation, hypertension, GERD, hypertriglyceridemia, CKD stage IIIb, BPH, multiple myeloma, prior tobacco use, presents to the emergency department from oncology clinic for persistent left flank pain.   Severe back pain radiating to the rightflank suspected due to pathology compression fracture T12--L4 Radiculopathy -- patient has history of compression fractures in the past underwent kyphoplasty x2 (per old record) --oncology following -- started on high dose steroid and Valtrex --7/18--Dr Chrystal--Radiation oncology to see pt. --Dr Rudene Christians evaluated and no need for Kyphoplasty at this time. Plan: --pain management per oncology --cont decadron, per oncology --palliative radiation tx   Stage III kappa chain myeloma --started pulse dose decadron by oncology --management per oncology  chronic constipation --miralax 34g q2h for 5 doses   Hypertension --cont coreg   history of a fib --cont coreg --resume Eliquis now that pt is not going for kyphoplasty.     Nutrition Status: Nutrition Problem: Severe Malnutrition (in the context of chronic illness) Etiology: decreased appetite Signs/Symptoms: severe muscle depletion, severe fat depletion, percent weight loss (17% weight loss x 3 months) Percent weight  loss: 17 % Interventions: Ensure Enlive (each supplement provides 350kcal and 20 grams of protein), MVI, Liberalize Diet   DVT prophylaxis: Lovenox SQ Code Status: Full code  Family Communication: wife updated at bedside today Level of care: Med-Surg Dispo:   The patient is from: home Anticipated d/c is to: undetermined Anticipated d/c date is: 1-2 days Patient currently is not medically ready to d/c due to: pt's movement limited by pain   Subjective and Interval History:  Pt reported feeling "comfortable", however, reported severe pain limiting him getting out of bed and ambulating.    Still no BM yet.   Objective: Vitals:   03/31/21 1934 04/01/21 0603 04/01/21 0724 04/01/21 1618  BP: (!) 154/86 (!) 182/89 (!) 147/78 (!) 156/95  Pulse: 63 61 65 64  Resp: '16  20 16  ' Temp: 97.9 F (36.6 C) (!) 97.4 F (36.3 C) (!) 97.4 F (36.3 C) 98.1 F (36.7 C)  TempSrc: Oral Oral Oral Oral  SpO2: 99% 92% 98% 94%  Weight:      Height:        Intake/Output Summary (Last 24 hours) at 04/01/2021 1724 Last data filed at 04/01/2021 1359 Gross per 24 hour  Intake 120 ml  Output 700 ml  Net -580 ml   Filed Weights   03/26/21 1536 03/26/21 2207  Weight: 81.6 kg 75.3 kg    Examination:   Constitutional: NAD, AAOx3 HEENT: conjunctivae and lids normal, EOMI CV: No cyanosis.   RESP: normal respiratory effort, on RA Extremities: No effusions, edema in BLE SKIN: warm, dry Neuro: II - XII grossly intact.   Psych: depressed mood and affect.     Data Reviewed: I have personally reviewed following labs and imaging studies  CBC: Recent Labs  Lab 03/26/21 1549  03/27/21 0646 04/01/21 0506  WBC 6.9 4.8 7.5  NEUTROABS 5.2  --   --   HGB 12.2* 10.5* 11.0*  HCT 38.5* 32.5* 32.9*  MCV 100.3* 96.4 94.5  PLT 247 212 588   Basic Metabolic Panel: Recent Labs  Lab 03/26/21 1549 03/27/21 0646 04/01/21 0506  NA 135 134* 139  K 4.2 3.5 4.4  CL 100 102 103  CO2 '22 27 27  ' GLUCOSE 98  90 136*  BUN 29* 23 47*  CREATININE 1.15 0.94 1.06  CALCIUM 9.2 8.3* 9.3  MG  --   --  2.2   GFR: Estimated Creatinine Clearance: 50.9 mL/min (by C-G formula based on SCr of 1.06 mg/dL). Liver Function Tests: Recent Labs  Lab 03/26/21 1549  AST 41  ALT 25  ALKPHOS 104  BILITOT 1.4*  PROT 6.0*  ALBUMIN 3.8   Recent Labs  Lab 03/26/21 1549  LIPASE 32   No results for input(s): AMMONIA in the last 168 hours. Coagulation Profile: No results for input(s): INR, PROTIME in the last 168 hours. Cardiac Enzymes: No results for input(s): CKTOTAL, CKMB, CKMBINDEX, TROPONINI in the last 168 hours. BNP (last 3 results) No results for input(s): PROBNP in the last 8760 hours. HbA1C: No results for input(s): HGBA1C in the last 72 hours. CBG: No results for input(s): GLUCAP in the last 168 hours. Lipid Profile: No results for input(s): CHOL, HDL, LDLCALC, TRIG, CHOLHDL, LDLDIRECT in the last 72 hours. Thyroid Function Tests: No results for input(s): TSH, T4TOTAL, FREET4, T3FREE, THYROIDAB in the last 72 hours. Anemia Panel: No results for input(s): VITAMINB12, FOLATE, FERRITIN, TIBC, IRON, RETICCTPCT in the last 72 hours. Sepsis Labs: Recent Labs  Lab 03/26/21 1625  LATICACIDVEN 4.0*    Recent Results (from the past 240 hour(s))  Resp Panel by RT-PCR (Flu A&B, Covid) Nasopharyngeal Swab     Status: None   Collection Time: 03/26/21  4:24 PM   Specimen: Nasopharyngeal Swab; Nasopharyngeal(NP) swabs in vial transport medium  Result Value Ref Range Status   SARS Coronavirus 2 by RT PCR NEGATIVE NEGATIVE Final    Comment: (NOTE) SARS-CoV-2 target nucleic acids are NOT DETECTED.  The SARS-CoV-2 RNA is generally detectable in upper respiratory specimens during the acute phase of infection. The lowest concentration of SARS-CoV-2 viral copies this assay can detect is 138 copies/mL. A negative result does not preclude SARS-Cov-2 infection and should not be used as the sole basis for  treatment or other patient management decisions. A negative result may occur with  improper specimen collection/handling, submission of specimen other than nasopharyngeal swab, presence of viral mutation(s) within the areas targeted by this assay, and inadequate number of viral copies(<138 copies/mL). A negative result must be combined with clinical observations, patient history, and epidemiological information. The expected result is Negative.  Fact Sheet for Patients:  EntrepreneurPulse.com.au  Fact Sheet for Healthcare Providers:  IncredibleEmployment.be  This test is no t yet approved or cleared by the [place] FDA and  has been authorized for detection and/or diagnosis of SARS-CoV-2 by FDA under an Emergency Use Authorization (EUA). This EUA will remain  in effect (meaning this test can be used) for the duration of the COVID-19 declaration under Section 564(b)(1) of the Act, 21 U.S.C.section 360bbb-3(b)(1), unless the authorization is terminated  or revoked sooner.       Influenza A by PCR NEGATIVE NEGATIVE Final   Influenza B by PCR NEGATIVE NEGATIVE Final    Comment: (NOTE) The Xpert Xpress SARS-CoV-2/FLU/RSV plus assay  is intended as an aid in the diagnosis of influenza from Nasopharyngeal swab specimens and should not be used as a sole basis for treatment. Nasal washings and aspirates are unacceptable for Xpert Xpress SARS-CoV-2/FLU/RSV testing.  Fact Sheet for Patients: EntrepreneurPulse.com.au  Fact Sheet for Healthcare Providers: IncredibleEmployment.be  This test is not yet approved or cleared by the [place] FDA and has been authorized for detection and/or diagnosis of SARS-CoV-2 by FDA under an Emergency Use Authorization (EUA). This EUA will remain in effect (meaning this test can be used) for the duration of the COVID-19 declaration under Section 564(b)(1) of the Act, 21  U.S.C. section 360bbb-3(b)(1), unless the authorization is terminated or revoked.  Performed at [facility], [place], [place] [address]   Respiratory (~20 pathogens) panel by PCR     Status: None   Collection Time: 03/26/21  6:40 PM   Specimen: Nasopharyngeal Swab; Respiratory  Result Value Ref Range Status   Adenovirus NOT DETECTED NOT DETECTED Final   Coronavirus 229E NOT DETECTED NOT DETECTED Final    Comment: (NOTE) The Coronavirus on the Respiratory Panel, DOES NOT test for the novel  Coronavirus (2019 nCoV)    Coronavirus HKU1 NOT DETECTED NOT DETECTED Final   Coronavirus NL63 NOT DETECTED NOT DETECTED Final   Coronavirus OC43 NOT DETECTED NOT DETECTED Final   Metapneumovirus NOT DETECTED NOT DETECTED Final   Rhinovirus / Enterovirus NOT DETECTED NOT DETECTED Final   Influenza A NOT DETECTED NOT DETECTED Final   Influenza B NOT DETECTED NOT DETECTED Final   Parainfluenza Virus 1 NOT DETECTED NOT DETECTED Final   Parainfluenza Virus 2 NOT DETECTED NOT DETECTED Final   Parainfluenza Virus 3 NOT DETECTED NOT DETECTED Final   Parainfluenza Virus 4 NOT DETECTED NOT DETECTED Final   Respiratory Syncytial Virus NOT DETECTED NOT DETECTED Final   Bordetella pertussis NOT DETECTED NOT DETECTED Final   Bordetella Parapertussis NOT DETECTED NOT DETECTED Final   Chlamydophila pneumoniae NOT DETECTED NOT DETECTED Final   Mycoplasma pneumoniae NOT DETECTED NOT DETECTED Final    Comment: Performed at [facility], [place]. [address], [place], [place] [address]      Radiology Studies: No results found.   Scheduled Meds:  atorvastatin  40 mg Oral QHS   baclofen  10 mg Oral TID   carvedilol  25 mg Oral BID WC   dexamethasone  20 mg Oral Once   dexamethasone  40 mg Oral Daily   enoxaparin (LOVENOX) injection  1 mg/kg Subcutaneous Q12H   feeding supplement  237 mL Oral BID BM   gabapentin  300 mg Oral TID   lidocaine  1 patch Transdermal Q24H    multivitamin with minerals  1 tablet Oral Daily   oxyCODONE  10 mg Oral Once   oxyCODONE  20 mg Oral Q12H   pantoprazole  40 mg Oral QHS   polyethylene glycol  34 g Oral Q2H   senna-docusate  1 tablet Oral BID   valACYclovir  500 mg Oral Daily   Continuous Infusions:   LOS: 5 days     Enzo Bi, MD [facility] If 7PM-7AM, please contact night-coverage 04/01/2021, 5:24 PM

## 2021-04-02 ENCOUNTER — Ambulatory Visit: Payer: PPO

## 2021-04-02 DIAGNOSIS — J9601 Acute respiratory failure with hypoxia: Secondary | ICD-10-CM | POA: Diagnosis not present

## 2021-04-02 DIAGNOSIS — C7951 Secondary malignant neoplasm of bone: Secondary | ICD-10-CM | POA: Diagnosis not present

## 2021-04-02 LAB — BASIC METABOLIC PANEL
Anion gap: 11 (ref 5–15)
BUN: 43 mg/dL — ABNORMAL HIGH (ref 8–23)
CO2: 25 mmol/L (ref 22–32)
Calcium: 9 mg/dL (ref 8.9–10.3)
Chloride: 101 mmol/L (ref 98–111)
Creatinine, Ser: 1.02 mg/dL (ref 0.61–1.24)
GFR, Estimated: >60 mL/min (ref >60)
Glucose, Bld: 117 mg/dL — ABNORMAL HIGH (ref 70–99)
Potassium: 4.1 mmol/L (ref 3.5–5.1)
Sodium: 137 mmol/L (ref 135–145)

## 2021-04-02 LAB — CBC
HCT: 33.7 % — ABNORMAL LOW (ref 39.0–52.0)
Hemoglobin: 11.2 g/dL — ABNORMAL LOW (ref 13.0–17.0)
MCH: 32.2 pg (ref 26.0–34.0)
MCHC: 33.2 g/dL (ref 30.0–36.0)
MCV: 96.8 fL (ref 80.0–100.0)
Platelets: 139 10*3/uL — ABNORMAL LOW (ref 150–400)
RBC: 3.48 MIL/uL — ABNORMAL LOW (ref 4.22–5.81)
RDW: 16.3 % — ABNORMAL HIGH (ref 11.5–15.5)
WBC: 7.3 10*3/uL (ref 4.0–10.5)
nRBC: 0.4 % — ABNORMAL HIGH (ref 0.0–0.2)

## 2021-04-02 LAB — MAGNESIUM: Magnesium: 2.2 mg/dL (ref 1.7–2.4)

## 2021-04-02 MED ORDER — GABAPENTIN 300 MG PO CAPS
600.0000 mg | ORAL_CAPSULE | Freq: Three times a day (TID) | ORAL | Status: DC
  Administered 2021-04-02 – 2021-04-07 (×13): 600 mg via ORAL
  Filled 2021-04-02 (×15): qty 2

## 2021-04-02 MED ORDER — APIXABAN 5 MG PO TABS
5.0000 mg | ORAL_TABLET | Freq: Two times a day (BID) | ORAL | Status: DC
  Administered 2021-04-02 – 2021-04-07 (×10): 5 mg via ORAL
  Filled 2021-04-02 (×10): qty 1

## 2021-04-02 MED ORDER — POLYETHYLENE GLYCOL 3350 17 G PO PACK
17.0000 g | PACK | Freq: Two times a day (BID) | ORAL | Status: DC
  Administered 2021-04-03 – 2021-04-07 (×6): 17 g via ORAL
  Filled 2021-04-02 (×6): qty 1

## 2021-04-02 MED ORDER — ENSURE ENLIVE PO LIQD
237.0000 mL | Freq: Three times a day (TID) | ORAL | Status: DC
  Administered 2021-04-02 – 2021-04-06 (×12): 237 mL via ORAL

## 2021-04-02 NOTE — Progress Notes (Signed)
Nutrition Follow-up  DOCUMENTATION CODES:  Severe malnutrition in context of chronic illness  INTERVENTION:  Obtain new weight.  Increase Ensure Enlive from BID to TID.  Add Magic cup TID with meals, each supplement provides 290 kcal and 9 grams of protein.  Continue MVI with minerals daily.  NUTRITION DIAGNOSIS:  Severe Malnutrition (in the context of chronic illness) related to decreased appetite as evidenced by severe muscle depletion, severe fat depletion, percent weight loss (17% weight loss x 3 months). - ongoing  GOAL:  Patient will meet greater than or equal to 90% of their needs - not meeting  MONITOR:  PO intake, Supplement acceptance, Labs, Weight trends  REASON FOR ASSESSMENT:  Malnutrition Screening Tool    ASSESSMENT:  87 y.o. male with medical history significant for atrial fibrillation, GERD, HTN, HLD, CKD stage IIIb, BPH, recent dx of multiple myeloma, and prior tobacco use, sent to ED from oncology clinic for persistent left flank pain.  Intake between 30-100% the last 6 documented meals. Pt taking Ensure supplements.  Admit wt: 75.3 kg Pt due for new weight tomorrow - RD to order.  RD to increase Ensure from BID to TID and add Magic Cup TID to promote intake.  Medications: reviewed; dexamethasone, EE BID, MVI with minerals, oxycodone BID, Protonix, Senokot BID  Labs: reviewed; Glucose 117 (H)  Diet Order:   Diet Order             Diet regular Room service appropriate? Yes; Fluid consistency: Thin  Diet effective now                  EDUCATION NEEDS:  No education needs have been identified at this time  Skin:  Skin Assessment: Reviewed RN Assessment  Last BM:  7/12 per RN documentation  Height:  Ht Readings from Last 1 Encounters:  03/27/21 5' 9" (1.753 m)   Weight:  Wt Readings from Last 1 Encounters:  03/26/21 75.3 kg   Ideal Body Weight:  72.7 kg  BMI:  Body mass index is 24.51 kg/m.  Estimated Nutritional Needs:  Kcal:   2100-2300 kcal/d Protein:  105-120 g/d Fluid:  >2.1 L/d  Derrel Nip, RD, LDN (she/her/hers) Registered Dietitian I After-Hours/Weekend Pager # in [place]

## 2021-04-02 NOTE — Progress Notes (Signed)
PHARMACY NOTE:  DOSAGE ADJUSTMENT  Current apixaban regimen includes a mismatch between dosage and indication.  This patient meets only 1 of 3 dose reduction criteria (Age ?19 years, body weight ?60 kg, or serum creatinine ?1.5 mg/dL) The dosage will be adjusted accordingly.  Current  dosage:  apixaban 2.5 mg po BID  Indication: atrial fibrillation  Renal Function:  Estimated Creatinine Clearance: 52.9 mL/min (by C-G formula based on SCr of 1.02 mg/dL).  dosage has been changed to:  apixaban 5 mg po BID   Thank you for allowing pharmacy to be a part of this patient's care.  Dallie Piles, [facility] 04/02/2021 1:56 PM

## 2021-04-02 NOTE — Progress Notes (Addendum)
PROGRESS NOTE    Jack [name]  SNK:[id number] DOB: Jul 24, 1933 DOA: 03/26/2021 PCP: [name], MD  [id number]/[id number]   Assessment & Plan:   Principal Problem:   Acute hypoxemic respiratory failure ([place]) Active Problems:   Essential hypertension   GERD   Prostate hypertrophy   Hyperlipidemia   Constipation   Multiple myeloma not having achieved remission (HCC)   Right flank pain   Compression fracture of body of thoracic vertebra (HCC)   CHANLER [name] is a 87 y.o. male with medical history significant for paroxysmal atrial fibrillation, PACs, coronary calcium and aortic atherosclerosis, diastolic dysfunction, P3XT, GERD, aortic root dilatation, ascending aortic dilatation, hypertension, GERD, hypertriglyceridemia, CKD stage IIIb, BPH, multiple myeloma, prior tobacco use, presented to the emergency department from oncology clinic for right flank swelling and Neoplasm-related pain.    Severe back pain radiating to the right flank suspected due to pathology compression fracture T12--L4 Radiculopathy -- patient has history of compression fractures in the past underwent kyphoplasty x2 (per old record) --oncology following -- started on high dose steroid and Valtrex --7/18--Dr Chrystal--Radiation oncology to see pt. --Dr Rudene Christians evaluated and no need for Kyphoplasty at this time. Plan: --pain management per oncology --increase gabapentin to 600 mg TID --cont decadron, per oncology --palliative radiation tx per radiation oncology   Stage III kappa chain myeloma --started pulse dose decadron by oncology --cont decadron  constipation, resolved --resolved with aggressive Miralax --daily bowel regimen   Hypertension --cont coreg   history of a fib --cont coreg --cont Eliquis (pharm recommended increasing the dose)   Nutrition Status: Nutrition Problem: Severe Malnutrition (in the context of chronic illness) Etiology: decreased appetite Signs/Symptoms: severe muscle  depletion, severe fat depletion, percent weight loss (17% weight loss x 3 months) Percent weight loss: 17 % Interventions: Ensure Enlive (each supplement provides 350kcal and 20 grams of protein), MVI, Liberalize Diet   DVT prophylaxis: Lovenox SQ Code Status: Full code  Family Communication: wife updated at bedside today Level of care: Med-Surg Dispo:   The patient is from: home Anticipated d/c is to: undetermined Anticipated d/c date is: undetermined Patient currently is not medically ready to d/c due to: pt's movement limited by pain   Subjective and Interval History:  Making progress with PT, able to ambulate around RN station, however, pt didn't think he can go home and still make it to his cancer treatment.  Had large BM with aggressive Miralax yesterday.   Objective: Vitals:   04/02/21 0352 04/02/21 0528 04/02/21 0730 04/02/21 1534  BP: (!) 170/94 (!) 147/91 (!) 144/89 135/87  Pulse: (!) 52  (!) 55 (!) 53  Resp: '19  19 18  ' Temp: 97.9 F (36.6 C)   97.8 F (36.6 C)  TempSrc: Oral     SpO2: 97%  97% 90%  Weight:      Height:        Intake/Output Summary (Last 24 hours) at 04/02/2021 1811 Last data filed at 04/02/2021 1310 Gross per 24 hour  Intake 600 ml  Output 500 ml  Net 100 ml   Filed Weights   03/26/21 1536 03/26/21 2207  Weight: 81.6 kg 75.3 kg    Examination:   Constitutional: NAD, AAOx3, sitting in recliner with back brace on HEENT: conjunctivae and lids normal, EOMI CV: No cyanosis.   RESP: normal respiratory effort, on RA Extremities: No effusions, edema in BLE SKIN: warm, dry Neuro: II - XII grossly intact.   Psych: depressed mood and affect.  Data Reviewed: I have personally reviewed following labs and imaging studies  CBC: Recent Labs  Lab 03/27/21 0646 04/01/21 0506 04/02/21 0648  WBC 4.8 7.5 7.3  HGB 10.5* 11.0* 11.2*  HCT 32.5* 32.9* 33.7*  MCV 96.4 94.5 96.8  PLT 212 170 914*   Basic Metabolic Panel: Recent Labs  Lab  03/27/21 0646 04/01/21 0506 04/02/21 0648  NA 134* 139 137  K 3.5 4.4 4.1  CL 102 103 101  CO2 '27 27 25  ' GLUCOSE 90 136* 117*  BUN 23 47* 43*  CREATININE 0.94 1.06 1.02  CALCIUM 8.3* 9.3 9.0  MG  --  2.2 2.2   GFR: Estimated Creatinine Clearance: 52.9 mL/min (by C-G formula based on SCr of 1.02 mg/dL). Liver Function Tests: No results for input(s): AST, ALT, ALKPHOS, BILITOT, PROT, ALBUMIN in the last 168 hours.  No results for input(s): LIPASE, AMYLASE in the last 168 hours.  No results for input(s): AMMONIA in the last 168 hours. Coagulation Profile: No results for input(s): INR, PROTIME in the last 168 hours. Cardiac Enzymes: No results for input(s): CKTOTAL, CKMB, CKMBINDEX, TROPONINI in the last 168 hours. BNP (last 3 results) No results for input(s): PROBNP in the last 8760 hours. HbA1C: No results for input(s): HGBA1C in the last 72 hours. CBG: No results for input(s): GLUCAP in the last 168 hours. Lipid Profile: No results for input(s): CHOL, HDL, LDLCALC, TRIG, CHOLHDL, LDLDIRECT in the last 72 hours. Thyroid Function Tests: No results for input(s): TSH, T4TOTAL, FREET4, T3FREE, THYROIDAB in the last 72 hours. Anemia Panel: No results for input(s): VITAMINB12, FOLATE, FERRITIN, TIBC, IRON, RETICCTPCT in the last 72 hours. Sepsis Labs: No results for input(s): PROCALCITON, LATICACIDVEN in the last 168 hours.   Recent Results (from the past 240 hour(s))  Resp Panel by RT-PCR (Flu A&B, Covid) Nasopharyngeal Swab     Status: None   Collection Time: 03/26/21  4:24 PM   Specimen: Nasopharyngeal Swab; Nasopharyngeal(NP) swabs in vial transport medium  Result Value Ref Range Status   SARS Coronavirus 2 by RT PCR NEGATIVE NEGATIVE Final    Comment: (NOTE) SARS-CoV-2 target nucleic acids are NOT DETECTED.  The SARS-CoV-2 RNA is generally detectable in upper respiratory specimens during the acute phase of infection. The lowest concentration of SARS-CoV-2 viral  copies this assay can detect is 138 copies/mL. A negative result does not preclude SARS-Cov-2 infection and should not be used as the sole basis for treatment or other patient management decisions. A negative result may occur with  improper specimen collection/handling, submission of specimen other than nasopharyngeal swab, presence of viral mutation(s) within the areas targeted by this assay, and inadequate number of viral copies(<138 copies/mL). A negative result must be combined with clinical observations, patient history, and epidemiological information. The expected result is Negative.  Fact Sheet for Patients:  EntrepreneurPulse.com.au  Fact Sheet for Healthcare Providers:  IncredibleEmployment.be  This test is no t yet approved or cleared by the [place] FDA and  has been authorized for detection and/or diagnosis of SARS-CoV-2 by FDA under an Emergency Use Authorization (EUA). This EUA will remain  in effect (meaning this test can be used) for the duration of the COVID-19 declaration under Section 564(b)(1) of the Act, 21 U.S.C.section 360bbb-3(b)(1), unless the authorization is terminated  or revoked sooner.       Influenza A by PCR NEGATIVE NEGATIVE Final   Influenza B by PCR NEGATIVE NEGATIVE Final    Comment: (NOTE) The Xpert Xpress SARS-CoV-2/FLU/RSV plus assay  is intended as an aid in the diagnosis of influenza from Nasopharyngeal swab specimens and should not be used as a sole basis for treatment. Nasal washings and aspirates are unacceptable for Xpert Xpress SARS-CoV-2/FLU/RSV testing.  Fact Sheet for Patients: EntrepreneurPulse.com.au  Fact Sheet for Healthcare Providers: IncredibleEmployment.be  This test is not yet approved or cleared by the [place] FDA and has been authorized for detection and/or diagnosis of SARS-CoV-2 by FDA under an Emergency Use Authorization (EUA). This  EUA will remain in effect (meaning this test can be used) for the duration of the COVID-19 declaration under Section 564(b)(1) of the Act, 21 U.S.C. section 360bbb-3(b)(1), unless the authorization is terminated or revoked.  Performed at [facility], [place], [place] [address]   Respiratory (~20 pathogens) panel by PCR     Status: None   Collection Time: 03/26/21  6:40 PM   Specimen: Nasopharyngeal Swab; Respiratory  Result Value Ref Range Status   Adenovirus NOT DETECTED NOT DETECTED Final   Coronavirus 229E NOT DETECTED NOT DETECTED Final    Comment: (NOTE) The Coronavirus on the Respiratory Panel, DOES NOT test for the novel  Coronavirus (2019 nCoV)    Coronavirus HKU1 NOT DETECTED NOT DETECTED Final   Coronavirus NL63 NOT DETECTED NOT DETECTED Final   Coronavirus OC43 NOT DETECTED NOT DETECTED Final   Metapneumovirus NOT DETECTED NOT DETECTED Final   Rhinovirus / Enterovirus NOT DETECTED NOT DETECTED Final   Influenza A NOT DETECTED NOT DETECTED Final   Influenza B NOT DETECTED NOT DETECTED Final   Parainfluenza Virus 1 NOT DETECTED NOT DETECTED Final   Parainfluenza Virus 2 NOT DETECTED NOT DETECTED Final   Parainfluenza Virus 3 NOT DETECTED NOT DETECTED Final   Parainfluenza Virus 4 NOT DETECTED NOT DETECTED Final   Respiratory Syncytial Virus NOT DETECTED NOT DETECTED Final   Bordetella pertussis NOT DETECTED NOT DETECTED Final   Bordetella Parapertussis NOT DETECTED NOT DETECTED Final   Chlamydophila pneumoniae NOT DETECTED NOT DETECTED Final   Mycoplasma pneumoniae NOT DETECTED NOT DETECTED Final    Comment: Performed at [facility], [place]. [address], [place],  [address]      Radiology Studies: No results found.   Scheduled Meds:  apixaban  5 mg Oral BID   atorvastatin  40 mg Oral QHS   baclofen  10 mg Oral TID   carvedilol  25 mg Oral BID WC   dexamethasone  20 mg Oral Once   dexamethasone  40 mg Oral Daily   feeding  supplement  237 mL Oral TID BM   gabapentin  600 mg Oral TID   lidocaine  1 patch Transdermal Q24H   multivitamin with minerals  1 tablet Oral Daily   oxyCODONE  10 mg Oral Once   oxyCODONE  20 mg Oral Q12H   pantoprazole  40 mg Oral QHS   senna-docusate  1 tablet Oral BID   valACYclovir  500 mg Oral Daily   Continuous Infusions:   LOS: 6 days     Enzo Bi, MD [facility] If 7PM-7AM, please contact night-coverage 04/02/2021, 6:11 PM

## 2021-04-02 NOTE — Progress Notes (Signed)
Physical Therapy Treatment Patient Details Name: Jack [name] MRN: [id number] DOB: 24-Sep-1932 Today's Date: 04/02/2021    History of Present Illness Pt admitted 03/26/21 for multiple myloma and uncontrolled pain; pt starte chemotherapy on this date in the hospital. Pt with recent hospitalization 2 weeks ago for L2-L3 kyphoplasty and wears TLSO for OOB mobility. PMH includes:  gastric reflux, AFIB, hypertension, hyperlipidemia, recent T12/L1 kyphoplasty, and multiple myeloma.    PT Comments    Pt is making good progress towards goals with ability to ambulate around RN station despite pain. Pt out for cancer treatment earlier delaying therapist arrival. Pt still needs slight assist to stand. No LOB noted. Would benefit from 24/7 assist at home. Able to don back brace. Will continue to progress as able.   Follow Up Recommendations  Home health PT;Supervision/Assistance - 24 hour     Equipment Recommendations  None recommended by PT    Recommendations for Other Services       Precautions / Restrictions Precautions Precautions: Fall Restrictions Weight Bearing Restrictions: No    Mobility  Bed Mobility Overal bed mobility: Needs Assistance Bed Mobility: Rolling;Sidelying to Sit Rolling: Supervision   Supine to sit: Supervision     General bed mobility comments: safe technique with upright posture. Pt then able to don TSLO with min assist    Transfers Overall transfer level: Needs assistance Equipment used: Rolling walker (2 wheeled) Transfers: Sit to/from Stand Sit to Stand: Min assist         General transfer comment: required bed elevation to assist in standing with min assist. RW used with upright posture once standing.  Ambulation/Gait Ambulation/Gait assistance: Min guard Gait Distance (Feet): 200 Feet Assistive device: Rolling walker (2 wheeled) Gait Pattern/deviations: Step-through pattern     General Gait Details: slow antlagic gait pattern with RW.  Upright posture with 2 standing rest breaks   Stairs             Wheelchair Mobility    Modified Rankin (Stroke Patients Only)       Balance Overall balance assessment: Needs assistance Sitting-balance support: Feet supported;No upper extremity supported Sitting balance-Leahy Scale: Fair     Standing balance support: Bilateral upper extremity supported;During functional activity Standing balance-Leahy Scale: Fair                              Cognition Arousal/Alertness: Awake/alert Behavior During Therapy: WFL for tasks assessed/performed Overall Cognitive Status: Within Functional Limits for tasks assessed                                 General Comments: very pleasant and willing to work with therapy despite pain      Exercises      General Comments        Pertinent Vitals/Pain Pain Assessment: 0-10 Pain Score: 9  Pain Location: back Pain Descriptors / Indicators: Discomfort Pain Intervention(s): Limited activity within patient's tolerance;Repositioned    Home Living                      Prior Function            PT Goals (current goals can now be found in the care plan section) Acute Rehab PT Goals Patient Stated Goal: to go home PT Goal Formulation: With patient Time For Goal Achievement: 04/11/21 Potential to Achieve Goals: Good Progress towards  PT goals: Progressing toward goals    Frequency    Min 2X/week      PT Plan Current plan remains appropriate    Co-evaluation              AM-PAC PT "6 Clicks" Mobility   Outcome Measure  Help needed turning from your back to your side while in a flat bed without using bedrails?: A Little Help needed moving from lying on your back to sitting on the side of a flat bed without using bedrails?: A Little Help needed moving to and from a bed to a chair (including a wheelchair)?: A Little Help needed standing up from a chair using your arms (e.g.,  wheelchair or bedside chair)?: A Little Help needed to walk in hospital room?: A Little Help needed climbing 3-5 steps with a railing? : A Lot 6 Click Score: 17    End of Session Equipment Utilized During Treatment: Gait belt Activity Tolerance: Patient tolerated treatment well Patient left: in chair;with family/visitor present;with call bell/phone within reach Nurse Communication: Mobility status PT Visit Diagnosis: Other abnormalities of gait and mobility (R26.89);Muscle weakness (generalized) (M62.81);Difficulty in walking, not elsewhere classified (R26.2);Pain;Unsteadiness on feet (R26.81) Pain - Right/Left:  (back)     Time: 3267-1245 PT Time Calculation (min) (ACUTE ONLY): 26 min  Charges:  $Gait Training: 23-37 mins                     Jack [name], [place], DPT [phone]    Jack [name] 04/02/2021, 4:56 PM

## 2021-04-03 ENCOUNTER — Inpatient Hospital Stay: Payer: PPO

## 2021-04-03 ENCOUNTER — Ambulatory Visit: Payer: PPO

## 2021-04-03 DIAGNOSIS — J9601 Acute respiratory failure with hypoxia: Secondary | ICD-10-CM | POA: Diagnosis not present

## 2021-04-03 DIAGNOSIS — Z5112 Encounter for antineoplastic immunotherapy: Secondary | ICD-10-CM | POA: Diagnosis not present

## 2021-04-03 DIAGNOSIS — Z515 Encounter for palliative care: Secondary | ICD-10-CM | POA: Diagnosis not present

## 2021-04-03 DIAGNOSIS — G893 Neoplasm related pain (acute) (chronic): Secondary | ICD-10-CM | POA: Insufficient documentation

## 2021-04-03 DIAGNOSIS — C9000 Multiple myeloma not having achieved remission: Secondary | ICD-10-CM

## 2021-04-03 LAB — BASIC METABOLIC PANEL
Anion gap: 6 (ref 5–15)
BUN: 45 mg/dL — ABNORMAL HIGH (ref 8–23)
CO2: 28 mmol/L (ref 22–32)
Calcium: 9.2 mg/dL (ref 8.9–10.3)
Chloride: 104 mmol/L (ref 98–111)
Creatinine, Ser: 0.94 mg/dL (ref 0.61–1.24)
GFR, Estimated: >60 mL/min (ref >60)
Glucose, Bld: 138 mg/dL — ABNORMAL HIGH (ref 70–99)
Potassium: 4.7 mmol/L (ref 3.5–5.1)
Sodium: 138 mmol/L (ref 135–145)

## 2021-04-03 LAB — CBC
HCT: 33.7 % — ABNORMAL LOW (ref 39.0–52.0)
Hemoglobin: 11 g/dL — ABNORMAL LOW (ref 13.0–17.0)
MCH: 31.5 pg (ref 26.0–34.0)
MCHC: 32.6 g/dL (ref 30.0–36.0)
MCV: 96.6 fL (ref 80.0–100.0)
Platelets: 122 10*3/uL — ABNORMAL LOW (ref 150–400)
RBC: 3.49 MIL/uL — ABNORMAL LOW (ref 4.22–5.81)
RDW: 16.3 % — ABNORMAL HIGH (ref 11.5–15.5)
WBC: 6.7 10*3/uL (ref 4.0–10.5)
nRBC: 0.4 % — ABNORMAL HIGH (ref 0.0–0.2)

## 2021-04-03 LAB — MAGNESIUM: Magnesium: 2.3 mg/dL (ref 1.7–2.4)

## 2021-04-03 MED ORDER — OXYCODONE HCL ER 20 MG PO T12A
20.0000 mg | EXTENDED_RELEASE_TABLET | Freq: Three times a day (TID) | ORAL | Status: DC
  Administered 2021-04-03 – 2021-04-06 (×7): 20 mg via ORAL
  Filled 2021-04-03 (×9): qty 1

## 2021-04-03 MED ORDER — BORTEZOMIB CHEMO SQ INJECTION 3.5 MG (2.5MG/ML)
1.3000 mg/m2 | Freq: Once | INTRAMUSCULAR | Status: AC
  Administered 2021-04-03: 2.5 mg via SUBCUTANEOUS
  Filled 2021-04-03: qty 1

## 2021-04-03 NOTE — Care Management Important Message (Signed)
Important Message  Patient Details  Name: Jack [name] MRN: [id number] Date of Birth: 1932/12/14   Medicare Important Message Given:  Yes    [name] 04/03/2021, 3:25 PM

## 2021-04-03 NOTE — Progress Notes (Signed)
[place]  [phone] [phone]   Name: Jack [name] Date: 04/03/2021 MRN: [id number]  DOB: 01-27-1933  Patient Care Team: [name], MD as PCP - General (Family Medicine) End, [name], MD as PCP - Cardiology (Cardiology) [name], MD (Endocrinology)    REASON FOR CONSULTATION: SANAD FEARNOW is a 87 y.o. male with multiple medical problems including  hypertension, CKD stage IIIb, A. fib on Eliquis, and anemia.  Patient was hospitalized 01/02/2021 to 01/07/2021 with T12 compression fracture with recommendation for conservative management including use of a TLSO brace.  Patient was hospitalized again 01/29/2021 to 02/07/2021 with recurrent back pain and MR showing T12 and new L1 compression fracture.  Patient underwent T12/L1 kyphoplasty on 02/02/2021.  There was also suspicion of a left fifth rib fracture, which occurred while transitioning from the stretcher for his kyphoplasty.  Patient had mild hypercalcemia with SPEP and UPEP concerning for possible myeloma.  Patient was referred to [facility] for work-up.  He was readmitted 03/11/2021 - 03/17/2021 with worsening back pain.  CT revealed new compression fractures of L2 and L3.  Patient underwent kyphoplasty on 6/30.  He is hospitalized again 03/26/2021 with severe back/flank pain.  MRI revealed T10 pathologic compression fracture.  He was started on treatment for multiple myeloma with dexamethasone/Velcade.  Palliative care was consulted to help address goals and manage ongoing symptoms.    CODE STATUS: Full code  PAST MEDICAL HISTORY: Past Medical History:  Diagnosis Date   Elevated prostate specific antigen (PSA)    has been 7 for a year    GERD (gastroesophageal reflux disease)    History of colon polyps 2008   [facility],    History of kidney stones    Hyperlipidemia    Hypertension    Prostate hypertrophy    diagnosed at age 26  due to hematospermia   Renal disorder     PAST SURGICAL HISTORY:  Past Surgical History:  Procedure Laterality Date   CATARACT EXTRACTION W/PHACO Left 01/10/2018   Procedure: CATARACT EXTRACTION PHACO AND INTRAOCULAR LENS PLACEMENT ([place]);  Surgeon: Birder Robson, MD;  Location: [facility] ORS;  Service: Ophthalmology;  Laterality: Left;  [name] 00:24.8 AP% 14.9 CDE 3.68 Fluid pack lot # [id number]   CATARACT EXTRACTION W/PHACO Right 01/25/2018   Procedure: CATARACT EXTRACTION PHACO AND INTRAOCULAR LENS PLACEMENT ([facility]);  Surgeon: Birder Robson, MD;  Location: [facility] ORS;  Service: Ophthalmology;  Laterality: Right;  [name] 00:42 AP% 10.8 CDE 4.59 Fluid pack lot # [id number]   COLON SURGERY     CYSTOSCOPY W/ URETERAL STENT PLACEMENT Right 10/16/2017   Procedure: right  URETERAL STENT PLACEMENT,cystoscopy bilateral stent removal,rretrograde;  Surgeon: Abbie Sons, MD;  Location: [facility] ORS;  Service: Urology;  Laterality: Right;   CYSTOSCOPY/URETEROSCOPY/HOLMIUM LASER/STENT PLACEMENT Right 12/16/2020   Procedure: CYSTOSCOPY/URETEROSCOPY/HOLMIUM LASER/STENT PLACEMENT;  Surgeon: Abbie Sons, MD;  Location: [facility] ORS;  Service: Urology;  Laterality: Right;   EXTRACORPOREAL SHOCK WAVE LITHOTRIPSY Right 12/11/2020   Procedure: EXTRACORPOREAL SHOCK WAVE LITHOTRIPSY (ESWL);  Surgeon: Abbie Sons, MD;  Location: [facility] ORS;  Service: Urology;  Laterality: Right;   IR KYPHO EA ADDL LEVEL THORACIC OR LUMBAR  02/02/2021   IR KYPHO LUMBAR INC FX REDUCE BONE BX UNI/BIL CANNULATION INC/IMAGING  02/02/2021   KIDNEY STONE SURGERY     KYPHOPLASTY N/A 03/12/2021   Procedure: Hewitt Shorts, L3;  Surgeon: Hessie Knows, MD;  Location: [facility] ORS;  Service: Orthopedics;  Laterality: N/A;  RESECTION SOFT TISSUE TUMOR LEG / ANKLE RADICAL  jan 2009   [facility],  right thigh/knee , nonmalignant   SMALL INTESTINE SURGERY  1946   implaed on picket fence, punctured stomach   TONSILLECTOMY      HEMATOLOGY/ONCOLOGY HISTORY:   Oncology History  Multiple myeloma not having achieved remission ([place])  03/12/2021 Initial Diagnosis   Kappa light chain myeloma ([place])    03/27/2021 -  Chemotherapy    Patient is on Treatment Plan: MYELOMA NON-TRANSPLANT CANDIDATES VRD SQ Q21D          ALLERGIES:  is allergic to quinolones, amlodipine, azithromycin, lisinopril, and morphine and related.  MEDICATIONS:  Current Facility-Administered Medications  Medication Dose Route Frequency Provider Last Rate Last Admin   apixaban (ELIQUIS) tablet 5 mg  5 mg Oral BID Dallie Piles, RPH   5 mg at 04/03/21 1039   atorvastatin (LIPITOR) tablet 40 mg  40 mg Oral QHS Cox, Amy N, DO   40 mg at 04/02/21 2100   baclofen (LIORESAL) tablet 10 mg  10 mg Oral TID Fritzi Mandes, MD   10 mg at 04/03/21 1045   bisacodyl (DULCOLAX) suppository 10 mg  10 mg Rectal Daily PRN Fritzi Mandes, MD   10 mg at 03/29/21 1521   carvedilol (COREG) tablet 25 mg  25 mg Oral BID WC Cox, Amy N, DO   25 mg at 04/03/21 1039   docusate sodium (COLACE) capsule 100 mg  100 mg Oral BID PRN Fritzi Mandes, MD       feeding supplement (ENSURE ENLIVE / ENSURE PLUS) liquid 237 mL  237 mL Oral TID BM Enzo Bi, MD   237 mL at 04/03/21 1048   gabapentin (NEURONTIN) capsule 600 mg  600 mg Oral TID Enzo Bi, MD   600 mg at 04/03/21 1042   hydrALAZINE (APRESOLINE) injection 10 mg  10 mg Intravenous Q4H PRN Rise Patience, MD   10 mg at 04/02/21 0406   ketorolac (TORADOL) 15 MG/ML injection 15 mg  15 mg Intravenous Q6H PRN Mansy, Jan A, MD   15 mg at 03/31/21 0410   lidocaine (LIDODERM) 5 % 1 patch  1 patch Transdermal Q24H Cox, Amy N, DO   1 patch at 04/02/21 2054   morphine 2 MG/ML injection 1 mg  1 mg Intravenous Q4H PRN Fritzi Mandes, MD   1 mg at 04/03/21 1033   multivitamin with minerals tablet 1 tablet  1 tablet Oral Daily Fritzi Mandes, MD   1 tablet at 04/03/21 1038   ondansetron (ZOFRAN) tablet 4 mg  4 mg Oral Q6H PRN Cox, Amy N, DO       Or   ondansetron (ZOFRAN)  injection 4 mg  4 mg Intravenous Q6H PRN Cox, Amy N, DO       oxyCODONE (OXYCONTIN) 12 hr tablet 20 mg  20 mg Oral Q8H Borders, Kirt Boys, NP       oxyCODONE-acetaminophen (PERCOCET/ROXICET) 5-325 MG per tablet 1-2 tablet  1-2 tablet Oral Q4H PRN Fritzi Mandes, MD   2 tablet at 04/02/21 1238   pantoprazole (PROTONIX) EC tablet 40 mg  40 mg Oral QHS Charlaine Dalton R, MD   40 mg at 04/02/21 2100   polyethylene glycol (MIRALAX / GLYCOLAX) packet 17 g  17 g Oral BID Enzo Bi, MD       senna-docusate (Senokot-S) tablet 1 tablet  1 tablet Oral BID Cox, Amy N, DO   1 tablet at 04/02/21 1024   traMADol Veatrice Bourbon)  tablet 50 mg  50 mg Oral Q6H PRN Fritzi Mandes, MD   50 mg at 04/02/21 0748   valACYclovir (VALTREX) tablet 500 mg  500 mg Oral Daily Charlaine Dalton R, MD   500 mg at 04/03/21 1038    VITAL SIGNS: BP (!) 177/90 (BP Location: Right Arm)   Pulse (!) 54   Temp (!) 97.5 F (36.4 C) (Oral)   Resp 12   Ht 5' 9" (1.753 m) Comment: per chart review  Wt 164 lb 14.5 oz (74.8 kg)   SpO2 99%   BMI 24.35 kg/m  Filed Weights   03/26/21 1536 03/26/21 2207 04/03/21 0308  Weight: 180 lb (81.6 kg) 166 lb 0.1 oz (75.3 kg) 164 lb 14.5 oz (74.8 kg)    Estimated body mass index is 24.35 kg/m as calculated from the following:   Height as of this encounter: 5' 9" (1.753 m).   Weight as of this encounter: 164 lb 14.5 oz (74.8 kg).  LABS: CBC:    Component Value Date/Time   WBC 6.7 04/03/2021 0552   HGB 11.0 (L) 04/03/2021 0552   HGB 11.0 (L) 02/24/2021 1014   HCT 33.7 (L) 04/03/2021 0552   HCT 35.3 (L) 02/24/2021 1014   PLT 122 (L) 04/03/2021 0552   PLT 249 02/24/2021 1014   MCV 96.6 04/03/2021 0552   MCV 96 02/24/2021 1014   MCV 90 01/24/2013 0819   NEUTROABS 5.2 03/26/2021 1549   NEUTROABS 7.4 (H) 01/24/2013 0819   LYMPHSABS 1.4 03/26/2021 1549   LYMPHSABS 1.2 01/24/2013 0819   MONOABS 0.3 03/26/2021 1549   MONOABS 0.3 01/24/2013 0819   EOSABS 0.0 03/26/2021 1549   EOSABS 0.0  01/24/2013 0819   BASOSABS 0.0 03/26/2021 1549   BASOSABS 0.0 01/24/2013 0819   Comprehensive Metabolic Panel:    Component Value Date/Time   NA 138 04/03/2021 0552   NA 136 02/24/2021 1014   NA 138 01/24/2013 0819   K 4.7 04/03/2021 0552   K 3.8 01/24/2013 0819   CL 104 04/03/2021 0552   CL 105 01/24/2013 0819   CO2 28 04/03/2021 0552   CO2 25 01/24/2013 0819   BUN 45 (H) 04/03/2021 0552   BUN 23 02/24/2021 1014   BUN 20 (H) 01/24/2013 0819   CREATININE 0.94 04/03/2021 0552   CREATININE 1.52 (H) 01/24/2013 0819   GLUCOSE 138 (H) 04/03/2021 0552   GLUCOSE 171 (H) 01/24/2013 0819   CALCIUM 9.2 04/03/2021 0552   CALCIUM 10.4 (H) 02/19/2021 1229   AST 41 03/26/2021 1549   AST 18 01/24/2013 0819   ALT 25 03/26/2021 1549   ALT 15 01/24/2013 0819   ALKPHOS 104 03/26/2021 1549   ALKPHOS 74 01/24/2013 0819   BILITOT 1.4 (H) 03/26/2021 1549   BILITOT 0.4 02/24/2021 1014   BILITOT 0.5 01/24/2013 0819   PROT 6.0 (L) 03/26/2021 1549   PROT 6.2 02/24/2021 1014   PROT 6.3 (L) 01/24/2013 0819   ALBUMIN 3.8 03/26/2021 1549   ALBUMIN 4.5 02/24/2021 1014   ALBUMIN 3.6 01/24/2013 0819    RADIOGRAPHIC STUDIES: DG Lumbar Spine 2-3 Views  Result Date: 03/12/2021 CLINICAL DATA:  Provided history: Fracture. Additional history provided: L2-L3 kyphoplasty. Provided fluoroscopy time 2 minutes, 27 seconds. EXAM: LUMBAR SPINE - 2-3 VIEW; DG C-ARM 1-60 MIN COMPARISON:  CT abdomen/pelvis 03/11/2021. FINDINGS: AP and lateral view intraprocedural fluoroscopic images of the lumbar spine are submitted, 2 images total. On the provided images, kyphoplasty material is present within three contiguous lumbar vertebral bodies (  these are presumably the L1, L2 and L3 vertebral bodies given the provided history and findings on prior imaging. However, neither the thoracolumbar nor lumbosacral junction are included in the field of view to allow exact level determination). IMPRESSION: Two intraprocedural fluoroscopic  images of the lumbar spine from reported L2 and L3 kyphoplasty, as described. Electronically Signed   By: Kellie Simmering DO   On: 03/12/2021 18:03   MR THORACIC SPINE W WO CONTRAST  Result Date: 03/29/2021 CLINICAL DATA:  Initial evaluation for hematologic malignancy, staging. Multiple myeloma. EXAM: MRI THORACIC WITHOUT AND WITH CONTRAST TECHNIQUE: Multiplanar and multiecho pulse sequences of the thoracic spine were obtained without and with intravenous contrast. CONTRAST:  47m GADAVIST GADOBUTROL 1 MMOL/ML IV SOLN COMPARISON:  MRI of the lumbar spine from 03/27/2021 as well as previous MRI from 01/29/2021. FINDINGS: Alignment: Physiologic with preservation of the normal thoracic kyphosis. Vertebrae: Abnormal heterogeneous signal intensity seen throughout the visualized bone marrow, presumably related to history of multiple myeloma. There is new abnormal STIR signal intensity involving the T10 and T11 vertebral bodies, new as compared to prior thoracic spine MRI from 01/29/2021, suggesting progressive disease. There is an associated pathologic compression fracture at the inferior endplate of TN82with no more than mild central height loss and trace 2 mm bony retropulsion, new from prior. The marrow edema at this level is likely at least in part related to the fracture. 1 cm STIR hyperintense lesion involving the T8 vertebral body is stable. Additional subcentimeter STIR hyperintense lesions involving the T6 and T7 vertebral bodies also similar. T9 hemangioma noted. Additional compression deformities involving the T12 through L3 vertebral bodies with sequelae of prior vertebral augmentation again noted. Mild chronic compression deformities at the superior endplates of T3 and T4 are stable. Vertebral body height otherwise maintained. Cord: Normal signal and morphology. Previously identified soft tissue density within the ventral epidural space extending from L1 through L2-3 again noted, better characterized on prior  MRI of the lumbar spine. No other extra osseous extension of tumor seen within the thoracic spine. Paraspinal and other soft tissues: Unremarkable. Disc levels: Mild for age multilevel disc desiccation seen throughout the thoracic spine. No significant disc bulge or focal disc herniation. No spinal stenosis. Foramina remain patent. IMPRESSION: 1. Abnormal heterogeneous signal intensity throughout the visualized bone marrow, consistent with history of multiple myeloma. Increased STIR signal intensity within the T10 and T11 vertebral bodies since prior thoracic spine MRI from 01/29/2021, suggesting progressive disease. 2. New pathologic compression fracture at the inferior endplate of TN56with no more than mild central height loss and trace 2 mm bony retropulsion. No stenosis. 3. Small amount of extra osseous tumor within the ventral epidural space at L1 through L2-3, better characterized on prior lumbar spine MRI. No other epidural or extraosseous tumor within the thoracic spine. 4. No significant canal or foraminal stenosis. Electronically Signed   By: BJeannine BogaM.D.   On: 03/29/2021 04:31   MR Lumbar Spine W Wo Contrast  Result Date: 03/27/2021 CLINICAL DATA:  Low back pain, history of multiple myeloma EXAM: MRI LUMBAR SPINE WITHOUT AND WITH CONTRAST TECHNIQUE: Multiplanar and multiecho pulse sequences of the lumbar spine were obtained without and with intravenous contrast. CONTRAST:  7.536mGADAVIST GADOBUTROL 1 MMOL/ML IV SOLN COMPARISON:  01/29/2021 FINDINGS: Segmentation:  Standard. Alignment:  No significant listhesis. Vertebrae: There are pathologic compression fractures at T12-L4. Progressed since the prior MRI but present on other interval imaging with exception of L4. Cement augmentation is present  at T12-L3. Minor endplate retropulsion, greatest at T12 and L1. There is mildly heterogeneous marrow signal with patchy STIR hyperintensity and enhancement. New mild ventral epidural soft tissue  extension is present at L1 to L2-L3. Conus medullaris and cauda equina: Conus extends to the L1-L2 level. Conus and cauda equina appear normal. No abnormal intrathecal enhancement. Paraspinal and other soft tissues: Unremarkable. Disc levels: L1-L2:  No significant canal or foraminal stenosis. L2-L3: Disc bulge. Facet arthropathy with ligamentum flavum infolding. No significant canal or foraminal stenosis. L3-L4: Disc bulge. Facet arthropathy with ligamentum flavum infolding. No significant canal or foraminal stenosis. L4-L5: Disc bulge. Facet arthropathy with ligamentum flavum infolding. No significant canal or foraminal stenosis. L5-S1:  No significant canal or foraminal stenosis. IMPRESSION: Pathologic compression fractures of T12-L4 with cement augmentation at T12-L3. Heterogeneous marrow signal a and enhancement likely reflecting known myeloma. There is new mild ventral epidural soft tissue extension at L1 to L2-L3. No significant canal or foraminal narrowing at any level. Electronically Signed   By: Macy Mis M.D.   On: 03/27/2021 14:39   CT ABDOMEN PELVIS W CONTRAST  Result Date: 03/26/2021 CLINICAL DATA:  Abdominal distension and abdominal pain EXAM: CT ABDOMEN AND PELVIS WITH CONTRAST TECHNIQUE: Multidetector CT imaging of the abdomen and pelvis was performed using the standard protocol following bolus administration of intravenous contrast. CONTRAST:  164m OMNIPAQUE IOHEXOL 350 MG/ML SOLN COMPARISON:  03/11/2021 FINDINGS: Lower chest: Unremarkable. Hepatobiliary: No suspicious focal abnormality within the liver parenchyma. There is no evidence for gallstones, gallbladder wall thickening, or pericholecystic fluid. No intrahepatic or extrahepatic biliary dilation. Pancreas: No focal mass lesion. No dilatation of the main duct. No intraparenchymal cyst. No peripancreatic edema. Spleen: No splenomegaly. No focal mass lesion. Adrenals/Urinary Tract: No adrenal nodule or mass. Kidneys unremarkable. No  evidence for hydroureter. The urinary bladder appears normal for the degree of distention. Stomach/Bowel: Tiny hiatal hernia. Stomach otherwise unremarkable. Duodenum is normally positioned as is the ligament of Treitz. No small bowel wall thickening. No small bowel dilatation. The terminal ileum is normal. The appendix is normal. No gross colonic mass. No colonic wall thickening. No substantial diverticular disease in the left colon. Colon is diffusely fluid-filled with fluid visible in the rectum, imaging findings that could be consistent diarrhea. No colonic wall thickening. Vascular/Lymphatic: There is moderate atherosclerotic calcification of the abdominal aorta without aneurysm. There is no gastrohepatic or hepatoduodenal ligament lymphadenopathy. No retroperitoneal or mesenteric lymphadenopathy. No pelvic sidewall lymphadenopathy. Reproductive: Prostate gland is markedly enlarged. Other: No intraperitoneal free fluid. Musculoskeletal: Mesh in the left groin region is compatible with prior herniorrhaphy. Bony mineralization is diffusely heterogeneous in this patient with a history of multiple myeloma. Patient is status post multiple level vertebral augmentation in the lower thoracic and lumbar spine. IMPRESSION: 1. No acute findings in the abdomen or pelvis. 2. Fluid in the right and transverse colon with visible fluid in the rectum. Imaging findings could be consistent with diarrhea. No evidence for colonic wall thickening to suggest colitis. 3. Marked prostatomegaly. 4. Tiny hiatal hernia. 5. Aortic Atherosclerosis (ICD10-I70.0). Electronically Signed   By: EMisty StanleyM.D.   On: 03/26/2021 18:09   CT ABDOMEN PELVIS W CONTRAST  Result Date: 03/11/2021 CLINICAL DATA:  87year old male with abdominal pain. Recent diagnosis of MGUS, concern for multiple myeloma. EXAM: CT ABDOMEN AND PELVIS WITH CONTRAST TECHNIQUE: Multidetector CT imaging of the abdomen and pelvis was performed using the standard protocol  following bolus administration of intravenous contrast. CONTRAST:  1066mOMNIPAQUE IOHEXOL 300  MG/ML  SOLN COMPARISON:  Noncontrast CT Abdomen and Pelvis 01/29/2021 and earlier. Thoracic and lumbar MRI 01/29/2021. FINDINGS: Lower chest: Stable small to moderate hiatal hernia. Tortuous thoracic aorta. Calcified coronary artery atherosclerosis. No cardiomegaly or pericardial effusion. No pleural effusion. Stable mild lung base atelectasis or scarring. Hepatobiliary: Gallbladder is at the upper limits of normal but does not appear inflamed. Negative liver. No bile duct enlargement. Pancreas: Negative. Spleen: Negative. Adrenals/Urinary Tract: Normal adrenal glands. Nonobstructed kidneys with symmetric renal enhancement and contrast excretion. There is probable small benign left renal parapelvic cyst accounting for the increased left renal hilum soft tissue on series 7, image 26, stable since 2018. Decompressed ureters. Decompressed and negative bladder. Stomach/Bowel: Negative large bowel aside from retained stool in the right colon. Diminutive, negative appendix on coronal image 48. Flocculated material in the terminal ileum but no dilated small bowel. Aside from hiatal hernia the stomach is negative. Negative duodenum. No free air or free fluid. Vascular/Lymphatic: Aortoiliac calcified atherosclerosis. Major arterial structures remain patent in the abdomen and pelvis. Tortuous iliac arteries. No lymphadenopathy. Reproductive: Prostatomegaly. Previous left inguinal hernia repair with mesh appears stable. Otherwise negative. Other: No pelvic free fluid. Musculoskeletal: Diffuse osteopenia and heterogeneous bone mineralization similar to the CT last month, distinctly different from a 2018 CT. Interval augmentation of the T12 and L1 compression fractures. New/progressed compression fractures of L2 and L3 since last month. No significant retropulsion of bone or other complicating features. Bone mineralization is  especially heterogeneous in the lumbar spine and pelvis, and these are likely pathologic fractures in the setting of MGUS/myeloma. No other acute osseous abnormality identified. IMPRESSION: 1. Diffusely abnormal bone mineralization in keeping with MGUS/Multiple Myeloma with new or progressed pathologic compression fractures of L2 and L3 since last month. Interval augmentation of T12 and L1. 2. No other acute finding in the abdomen or pelvis. Small to moderate hiatal hernia. Prostatomegaly. Aortic Atherosclerosis (ICD10-I70.0). Electronically Signed   By: Genevie Ann M.D.   On: 03/11/2021 09:53   NM PET Image Initial (PI) Whole Body  Result Date: 03/24/2021 CLINICAL DATA:  Initial treatment strategy for concern for multiple myeloma. Gammopathy with multiple M spikes. Prior kyphoplasty 12/16/2020 at L2-3. EXAM: NUCLEAR MEDICINE PET WHOLE BODY TECHNIQUE: 9.8 mCi F-18 FDG was injected intravenously. Full-ring PET imaging was performed from the head to foot after the radiotracer. CT data was obtained and used for attenuation correction and anatomic localization. Fasting blood glucose: 91 mg/dl COMPARISON:  03/11/2021 abdominopelvic CT.  Chest CT 11/21/2017. FINDINGS: Mediastinal blood pool activity: SUV max 3.1 HEAD/NECK: No areas of abnormal hypermetabolism. Incidental CT findings: Cerebral atrophy is within normal variation for age. No cervical adenopathy. Left carotid atherosclerotic calcifications. CHEST: No thoracic nodal hypermetabolism. Distal esophageal hypermetabolism in the setting of a small hiatal hernia. This measures a S.U.V. max of 4.6, including on approximately 139/3. No pulmonary parenchymal hypermetabolism. Incidental CT findings: Aortic and coronary artery calcification. Ascending aortic dilatation again identified, including up to 4.6 cm. Mild cardiomegaly. Calcified left-sided pleural plaques. Centrilobular emphysema. No thoracic adenopathy. ABDOMEN/PELVIS: No abdominopelvic parenchymal or nodal  hypermetabolism. Incidental CT findings: Normal adrenal glands. Proximal gastric underdistention. No abdominopelvic adenopathy. Moderate prostatomegaly. SKELETON: Multifocal thoracolumbar spine hypermetabolism. This is seen at the previously augmented levels and likely reactive. However, there is also hypermetabolism within the T11 vertebral body at a S.U.V. max of 5.1. Incidental CT findings: Diffuse heterogeneous osseous density, most consistent with myeloma involvement. EXTREMITIES: No areas of abnormal hypermetabolism. Incidental CT findings: Loose bodies about the  right knee are likely within a popliteal cyst. Subtle cortical irregularity which is most apparent in the femoral, less so humeral shafts, suspicious for myeloma involvement. IMPRESSION: 1. Heterogeneity throughout the marrow space, most consistent with the clinical history of multiple myeloma. The only suspicious area of osseous hypermetabolism is within the T11 vertebral body. 2. No findings of extra-osseous hypermetabolic myeloma. 3. Distal esophageal hypermetabolism, suspicious for esophagitis. Concurrent small hiatal hernia. 4. Incidental findings, including: Similar ascending aortic aneurysm. Aortic atherosclerosis (ICD10-I70.0), coronary artery atherosclerosis and emphysema (ICD10-J43.9). Left-sided pleural partially calcified pleural plaques may relate to prior empyema or hemothorax. Prostatomegaly. Electronically Signed   By: Abigail Miyamoto M.D.   On: 03/24/2021 16:05   DG Chest Portable 1 View  Result Date: 03/26/2021 CLINICAL DATA:  87 year old male with right flank pain and rib fracture. EXAM: PORTABLE CHEST 1 VIEW COMPARISON:  Chest radiograph dated 02/02/2021. FINDINGS: No focal consolidation, pleural effusion, or pneumothorax. Apparent two small adjacent calcified nodules in the left upper lobe with combined dimension of 9 mm. Attention on follow-up imaging recommended. The cardiac silhouette is within limits. No acute osseous  pathology. IMPRESSION: 1. No acute cardiopulmonary process. 2. A 9 mm left upper lobe calcified nodule. Electronically Signed   By: Anner Crete M.D.   On: 03/26/2021 19:08   DG C-Arm 1-60 Min  Result Date: 03/12/2021 CLINICAL DATA:  Provided history: Fracture. Additional history provided: L2-L3 kyphoplasty. Provided fluoroscopy time 2 minutes, 27 seconds. EXAM: LUMBAR SPINE - 2-3 VIEW; DG C-ARM 1-60 MIN COMPARISON:  CT abdomen/pelvis 03/11/2021. FINDINGS: AP and lateral view intraprocedural fluoroscopic images of the lumbar spine are submitted, 2 images total. On the provided images, kyphoplasty material is present within three contiguous lumbar vertebral bodies (these are presumably the L1, L2 and L3 vertebral bodies given the provided history and findings on prior imaging. However, neither the thoracolumbar nor lumbosacral junction are included in the field of view to allow exact level determination). IMPRESSION: Two intraprocedural fluoroscopic images of the lumbar spine from reported L2 and L3 kyphoplasty, as described. Electronically Signed   By: Kellie Simmering DO   On: 03/12/2021 18:03    PERFORMANCE STATUS (ECOG) : 2 - Symptomatic, <50% confined to bed  Review of Systems Unless otherwise noted, a complete review of systems is negative.  Physical Exam General: NAD Pulmonary: Unlabored Extremities: no edema, no joint deformities Skin: no rashes Neurological: Weakness but otherwise nonfocal  IMPRESSION: Follow-up visit.  Patient remains with significant and persistent pain in the back/right flank.  However, he says that it has improved some but is not yet what he would consider "good".  He continues to endorse frequent breakthrough pain and having to wait before pain medications are effective.  Patient has started XRT and hopefully will have improvement with pain over time.  In the interim, we will increase OxyContin to every 8 hour dosing to hopefully better control pain  around-the-clock.  Patient and I spoke at length regarding his goals for treatment.  He says that he has questioned whether any of this is "worth it."  He says that if there is little chance of meaningful improvement in functioning and quality of life, he would prefer just to be kept comfortable until his end-of-life.  I encouraged him to give treatment a chance as I hope that he will improve over time.  However, we also discussed the alternatives focus on comfort care and hospice.  Patient says that he fears becoming a burden to his wife.  He plans  to speak with her regarding his goals.  Discussed CODE STATUS.  Patient says that he is verbalized wanting to remain a full code as he thinks that is what his family would want.  He plans to speak with his family about decision-making.  PLAN: -Continue current scope of treatment -Increase OxyContin 20 mg to 8 hours -Continue Percocet as needed for breakthrough pain -Daily bowel regimen -Patient plans to speak with family regarding goals and decision-making -Will follow  Case and plan discussed with Drs. Grayland Ormond and Billie Ruddy   Time Total: 30 minutes  Visit consisted of counseling and education dealing with the complex and emotionally intense issues of symptom management and palliative care in the setting of serious and potentially life-threatening illness.Greater than 50%  of this time was spent counseling and coordinating care related to the above assessment and plan.  Signed by: Altha Harm, PhD, NP-C

## 2021-04-03 NOTE — TOC Progression Note (Signed)
Transition of Care [facility]) - Progression Note    Patient Details  Name: Jack [name] MRN: [id number] Date of Birth: 10/02/1932  Transition of Care [facility]) CM/SW [place] Phone Number: [phone] 04/03/2021, 4:15 PM  [name] Appeal Detailed Notice of Discharge letter created and saved: Yes (completed by Andreas Newport, RN) Detailed Notice of Discharge Document Given to [place]: Yes (Completed by Andreas Newport, RN) [name] ROI Document Created: Yes (Completed by [name]) [place] appeal documents uploaded to [place] stite: Yes (Uploaded 04/03/21. Upload confirmation: [id number])

## 2021-04-03 NOTE — TOC Progression Note (Addendum)
Transition of Care [facility]) - Progression Note    Patient Details  Name: Jack [name] MRN: [id number] Date of Birth: [birth year]  Transition of Care [facility]) CM/SW [place], LCSW Phone Number: 04/03/2021, 10:48 AM  Clinical Narrative:   Per team, patient and wife with concerns about transportation to palliative radiation after discharge. [facility] is able to use door-to-door service to get patient to appointments since he will be going to a [facility] facility. They are aware that this will be daily and that he has 8 more treatments left. Drucie Ip with [facility] gave CSW enrollment form to fill out. Once completed and emailed back to them, they will call patient to make arrangements.  1:44 pm: Provided update to patient. He's concerned getting him ready for radiation appointments would be too much work for his wife. He voiced frustration with pain, treatments, and wondering if it is all "worth it." Billey Chang, NP is aware and came by to talk with him.  2:23 pm: Received appointment information for next week. Emailed enrollment form and appointment details to [employer]. Patient told CSW earlier earlier today that he does have a wheelchair at home if needed.  2:52 pm: Updated patient's wife at bedside. She has concerns about taking him home today with current weakness. Asked MD to come by or call her.  Expected Discharge Plan: [place] Barriers to Discharge: Continued Medical Work up  Expected Discharge Plan and Services Expected Discharge Plan: [place]   Discharge Planning Services: CM Consult Post Acute Care Choice: Resumption of Svcs/PTA Provider, Home Health Living arrangements for the past 2 months: Single Family Home (Condo)                 DME Arranged: N/A DME Agency: NA       HH Arranged: RN, PT, OT [place] Agency: [place] ([facility]) Date HH Agency Contacted: 03/27/21 Time [place]: 1258 Representative spoke with at [place]: [place] (SDOH) Interventions    Readmission Risk Interventions Readmission Risk Prevention Plan 03/27/2021  Transportation Screening Complete  Medication Review Press photographer) Complete  PCP or Specialist appointment within 3-5 days of discharge Complete  HRI or [place] Complete  SW Recovery Care/Counseling Consult Complete  [place] Patient Refused  Some recent data might be hidden

## 2021-04-03 NOTE — Progress Notes (Signed)
PROGRESS NOTE    Jack [name]  ZGY:[id number] DOB: 03/23/1933 DOA: 03/26/2021 PCP: [name], MD  [id number]/[id number]-AA   Assessment & Plan:   Principal Problem:   Acute hypoxemic respiratory failure ([place]) Active Problems:   Essential hypertension   GERD   Prostate hypertrophy   Hyperlipidemia   Constipation   Multiple myeloma not having achieved remission (HCC)   Right flank pain   Compression fracture of body of thoracic vertebra (HCC)   Jack [name] is a 87 y.o. male with medical history significant for paroxysmal atrial fibrillation, PACs, coronary calcium and aortic atherosclerosis, diastolic dysfunction, R9FM, GERD, aortic root dilatation, ascending aortic dilatation, hypertension, GERD, hypertriglyceridemia, CKD stage IIIb, BPH, multiple myeloma, prior tobacco use, presented to the emergency department from oncology clinic for right flank swelling and Neoplasm-related pain.    Severe back pain radiating to the right flank suspected due to pathology compression fracture T12--L4 Radiculopathy from multiple myeloma within thoracic lumbar spine --patient has history of compression fractures in the past underwent kyphoplasty x2 (per old record) --oncology following --started on high dose steroid and Valtrex --started on palliative radiation therapy --Dr Rudene Christians evaluated and no need for Kyphoplasty at this time. Plan: --pain management per oncology, increase oxycontin 20 mg to TID, IV morphine PRN, Percocet PRN, IV toradol PRN --cont baclofen TID --cont gabapentin 600 mg TID --palliative radiation tx per radiation oncology   Stage III kappa chain myeloma --palliative radiation tx per radiation oncology  constipation, resolved --resolved with aggressive Miralax --daily Miralax scheduled   Hypertension --BP varied widely --cont coreg   history of a fib --cont coreg --cont Eliquis    Nutrition Status: Nutrition Problem: Severe Malnutrition (in the context of  chronic illness) Etiology: decreased appetite Signs/Symptoms: severe muscle depletion, severe fat depletion, percent weight loss (17% weight loss x 3 months) Percent weight loss: 17 % Interventions: Ensure Enlive (each supplement provides 350kcal and 20 grams of protein), MVI, Liberalize Diet   DVT prophylaxis: Lovenox SQ Code Status: Full code  Family Communication: wife updated at bedside today Level of care: Med-Surg Dispo:   The patient is from: home Anticipated d/c is to: home Anticipated d/c date is: undetermined Patient currently is not medically ready to d/c due to: pt's movement limited by pain   Subjective and Interval History:  TOC was able to make arrangement to transport pt from home to cancer center for treatment, however, pt and wife reported today that pt couldn't even get out of bed on his own.  Pt and wife did not want to leave the hospital.  Pt had more complaints of pain today.  Oncology PA increased oxycontin from BID to TID, and multiple PRN IV morphine given.  pt was found to be asleep throughout the day, though easily awaken.  After pt woke up, he then started to feel pain.     Objective: Vitals:   04/03/21 0308 04/03/21 0738 04/03/21 1058 04/03/21 1539  BP: (!) 154/78 (!) 177/90  (!) 154/98  Pulse: (!) 59 (!) 54  74  Resp: _0 Temp: 98 F (36.7 C) (!) 97.5 F (36.4 C)  (!) 97.4 F (36.3 C)  TempSrc: Oral Oral  Oral  SpO2: 96% (!) 85% 99% 94%  Weight: 74.8 kg     Height:        Intake/Output Summary (Last 24 hours) at 04/03/2021 1832 Last data filed at 04/02/2021 2156 Gross per 24 hour  Intake 0 ml  Output --  Net  0 ml   Filed Weights   03/26/21 1536 03/26/21 2207 04/03/21 0308  Weight: 81.6 kg 75.3 kg 74.8 kg    Examination:   Constitutional: asleep, easily awaken, then became restless HEENT: conjunctivae and lids normal, EOMI CV: No cyanosis.   RESP: normal respiratory effort, on RA Extremities: No effusions, edema in BLE SKIN:  warm, dry Neuro: II - XII grossly intact.     Data Reviewed: I have personally reviewed following labs and imaging studies  CBC: Recent Labs  Lab 04/01/21 0506 04/02/21 0648 04/03/21 0552  WBC 7.5 7.3 6.7  HGB 11.0* 11.2* 11.0*  HCT 32.9* 33.7* 33.7*  MCV 94.5 96.8 96.6  PLT 170 139* 147*   Basic Metabolic Panel: Recent Labs  Lab 04/01/21 0506 04/02/21 0648 04/03/21 0552  NA 139 137 138  K 4.4 4.1 4.7  CL 103 101 104  CO2 _0 GLUCOSE 136* 117* 138*  BUN 47* 43* 45*  CREATININE 1.06 1.02 0.94  CALCIUM 9.3 9.0 9.2  MG 2.2 2.2 2.3   GFR: Estimated Creatinine Clearance: 57.5 mL/min (by C-G formula based on SCr of 0.94 mg/dL). Liver Function Tests: No results for input(s): AST, ALT, ALKPHOS, BILITOT, PROT, ALBUMIN in the last 168 hours.  No results for input(s): LIPASE, AMYLASE in the last 168 hours.  No results for input(s): AMMONIA in the last 168 hours. Coagulation Profile: No results for input(s): INR, PROTIME in the last 168 hours. Cardiac Enzymes: No results for input(s): CKTOTAL, CKMB, CKMBINDEX, TROPONINI in the last 168 hours. BNP (last 3 results) No results for input(s): PROBNP in the last 8760 hours. HbA1C: No results for input(s): HGBA1C in the last 72 hours. CBG: No results for input(s): GLUCAP in the last 168 hours. Lipid Profile: No results for input(s): CHOL, HDL, LDLCALC, TRIG, CHOLHDL, LDLDIRECT in the last 72 hours. Thyroid Function Tests: No results for input(s): TSH, T4TOTAL, FREET4, T3FREE, THYROIDAB in the last 72 hours. Anemia Panel: No results for input(s): VITAMINB12, FOLATE, FERRITIN, TIBC, IRON, RETICCTPCT in the last 72 hours. Sepsis Labs: No results for input(s): PROCALCITON, LATICACIDVEN in the last 168 hours.   Recent Results (from the past 240 hour(s))  Resp Panel by RT-PCR (Flu A&B, Covid) Nasopharyngeal Swab     Status: None   Collection Time: 03/26/21  4:24 PM   Specimen: Nasopharyngeal Swab; Nasopharyngeal(NP)  swabs in vial transport medium  Result Value Ref Range Status   SARS Coronavirus 2 by RT PCR NEGATIVE NEGATIVE Final    Comment: (NOTE) SARS-CoV-2 target nucleic acids are NOT DETECTED.  The SARS-CoV-2 RNA is generally detectable in upper respiratory specimens during the acute phase of infection. The lowest concentration of SARS-CoV-2 viral copies this assay can detect is 138 copies/mL. A negative result does not preclude SARS-Cov-2 infection and should not be used as the sole basis for treatment or other patient management decisions. A negative result may occur with  improper specimen collection/handling, submission of specimen other than nasopharyngeal swab, presence of viral mutation(s) within the areas targeted by this assay, and inadequate number of viral copies(<138 copies/mL). A negative result must be combined with clinical observations, patient history, and epidemiological information. The expected result is Negative.  Fact Sheet for Patients:  EntrepreneurPulse.com.au  Fact Sheet for Healthcare Providers:  IncredibleEmployment.be  This test is no t yet approved or cleared by the [place] FDA and  has been authorized for detection and/or diagnosis of SARS-CoV-2 by FDA under an Emergency Use Authorization (EUA). This EUA  will remain  in effect (meaning this test can be used) for the duration of the COVID-19 declaration under Section 564(b)(1) of the Act, 21 U.S.C.section 360bbb-3(b)(1), unless the authorization is terminated  or revoked sooner.       Influenza A by PCR NEGATIVE NEGATIVE Final   Influenza B by PCR NEGATIVE NEGATIVE Final    Comment: (NOTE) The Xpert Xpress SARS-CoV-2/FLU/RSV plus assay is intended as an aid in the diagnosis of influenza from Nasopharyngeal swab specimens and should not be used as a sole basis for treatment. Nasal washings and aspirates are unacceptable for Xpert Xpress  SARS-CoV-2/FLU/RSV testing.  Fact Sheet for Patients: EntrepreneurPulse.com.au  Fact Sheet for Healthcare Providers: IncredibleEmployment.be  This test is not yet approved or cleared by the [place] FDA and has been authorized for detection and/or diagnosis of SARS-CoV-2 by FDA under an Emergency Use Authorization (EUA). This EUA will remain in effect (meaning this test can be used) for the duration of the COVID-19 declaration under Section 564(b)(1) of the Act, 21 U.S.C. section 360bbb-3(b)(1), unless the authorization is terminated or revoked.  Performed at [facility], [place], [place] [address]   Respiratory (~20 pathogens) panel by PCR     Status: None   Collection Time: 03/26/21  6:40 PM   Specimen: Nasopharyngeal Swab; Respiratory  Result Value Ref Range Status   Adenovirus NOT DETECTED NOT DETECTED Final   Coronavirus 229E NOT DETECTED NOT DETECTED Final    Comment: (NOTE) The Coronavirus on the Respiratory Panel, DOES NOT test for the novel  Coronavirus (2019 nCoV)    Coronavirus HKU1 NOT DETECTED NOT DETECTED Final   Coronavirus NL63 NOT DETECTED NOT DETECTED Final   Coronavirus OC43 NOT DETECTED NOT DETECTED Final   Metapneumovirus NOT DETECTED NOT DETECTED Final   Rhinovirus / Enterovirus NOT DETECTED NOT DETECTED Final   Influenza A NOT DETECTED NOT DETECTED Final   Influenza B NOT DETECTED NOT DETECTED Final   Parainfluenza Virus 1 NOT DETECTED NOT DETECTED Final   Parainfluenza Virus 2 NOT DETECTED NOT DETECTED Final   Parainfluenza Virus 3 NOT DETECTED NOT DETECTED Final   Parainfluenza Virus 4 NOT DETECTED NOT DETECTED Final   Respiratory Syncytial Virus NOT DETECTED NOT DETECTED Final   Bordetella pertussis NOT DETECTED NOT DETECTED Final   Bordetella Parapertussis NOT DETECTED NOT DETECTED Final   Chlamydophila pneumoniae NOT DETECTED NOT DETECTED Final   Mycoplasma pneumoniae NOT  DETECTED NOT DETECTED Final    Comment: Performed at [facility], [place]. [address] [place], [place] [address]      Radiology Studies: No results found.   Scheduled Meds:  apixaban  5 mg Oral BID   atorvastatin  40 mg Oral QHS   baclofen  10 mg Oral TID   carvedilol  25 mg Oral BID WC   feeding supplement  237 mL Oral TID BM   gabapentin  600 mg Oral TID   lidocaine  1 patch Transdermal Q24H   multivitamin with minerals  1 tablet Oral Daily   oxyCODONE  20 mg Oral Q8H   pantoprazole  40 mg Oral QHS   polyethylene glycol  17 g Oral BID   senna-docusate  1 tablet Oral BID   valACYclovir  500 mg Oral Daily   Continuous Infusions:   LOS: 7 days     Enzo Bi, MD [facility] If 7PM-7AM, please contact night-coverage 04/03/2021, 6:32 PM

## 2021-04-03 NOTE — Progress Notes (Signed)
Velcade shot given as an inpt on 2C. Pt tolerated well.

## 2021-04-04 DIAGNOSIS — J9601 Acute respiratory failure with hypoxia: Secondary | ICD-10-CM | POA: Diagnosis not present

## 2021-04-04 NOTE — Progress Notes (Signed)
PROGRESS NOTE    Jack [name]  GPQ:[id number] DOB: 12/21/1932 DOA: 03/26/2021 PCP: [name], MD  203A/[id number]-AA   Assessment & Plan:   Principal Problem:   Acute hypoxemic respiratory failure ([place]) Active Problems:   Essential hypertension   GERD   Prostate hypertrophy   Hyperlipidemia   Constipation   Multiple myeloma not having achieved remission (HCC)   Right flank pain   Compression fracture of body of thoracic vertebra (HCC)   Jack [name] is a 87 y.o. male with medical history significant for paroxysmal atrial fibrillation, PACs, coronary calcium and aortic atherosclerosis, diastolic dysfunction, E9MM, GERD, aortic root dilatation, ascending aortic dilatation, hypertension, GERD, hypertriglyceridemia, CKD stage IIIb, BPH, multiple myeloma, prior tobacco use, presented to the emergency department from oncology clinic for right flank swelling and Neoplasm-related pain.    Severe back pain radiating to the right flank suspected due to pathology compression fracture T12--L4 Radiculopathy from multiple myeloma within thoracic lumbar spine --patient has history of compression fractures in the past underwent kyphoplasty x2 (per old record) --oncology following --started on high dose steroid and Valtrex --started on palliative radiation therapy --Dr Rudene Christians evaluated and no need for Kyphoplasty at this time. Plan: --pain management per oncology, continue oxycontin 20 mg to TID, IV morphine PRN, Percocet PRN, IV toradol PRN --cont baclofen TID --cont gabapentin 600 mg TID --palliative radiation tx per radiation oncology   Stage III kappa chain myeloma --palliative radiation tx per radiation oncology  constipation, resolved --resolved with aggressive Miralax --daily Miralax scheduled   Hypertension --BP varied widely --cont coreg   history of a fib --cont coreg --cont Eliquis    Nutrition Status: Nutrition Problem: Severe Malnutrition (in the context of  chronic illness) Etiology: decreased appetite Signs/Symptoms: severe muscle depletion, severe fat depletion, percent weight loss (17% weight loss x 3 months) Percent weight loss: 17 % Interventions: Ensure Enlive (each supplement provides 350kcal and 20 grams of protein), MVI, Liberalize Diet   DVT prophylaxis: Lovenox SQ Code Status: Full code  Family Communication: wife updated at bedside today Level of care: Med-Surg Dispo:   The patient is from: home Anticipated d/c is to: home Anticipated d/c date is: after 8 session of radiation treatment Patient currently is not medically ready to d/c due to: pt's movement limited by pain, pt and wife can not manage at home.   Subjective and Interval History:  Pt reported feeling more comfortable today.   Objective: Vitals:   04/03/21 1930 04/04/21 0429 04/04/21 0740 04/04/21 1538  BP: 119/69 112/65 (!) 170/91 (!) 151/84  Pulse: 66 70 (!) 55 60  Resp: _0 Temp: 97.8 F (36.6 C) 98 F (36.7 C) 98.2 F (36.8 C) 97.8 F (36.6 C)  TempSrc: Oral   Oral  SpO2: 96% 96% 93% 96%  Weight:  75.6 kg    Height:        Intake/Output Summary (Last 24 hours) at 04/04/2021 1802 Last data filed at 04/04/2021 1027 Gross per 24 hour  Intake 0 ml  Output 200 ml  Net -200 ml   Filed Weights   03/26/21 2207 04/03/21 0308 04/04/21 0429  Weight: 75.3 kg 74.8 kg 75.6 kg    Examination:   Constitutional: NAD, AAOx3, lying in bed HEENT: conjunctivae and lids normal, EOMI CV: No cyanosis.   RESP: normal respiratory effort, on RA Extremities: No effusions, edema in BLE SKIN: warm, dry Neuro: II - XII grossly intact.     Data Reviewed: I have personally  reviewed following labs and imaging studies  CBC: Recent Labs  Lab 04/01/21 0506 04/02/21 0648 04/03/21 0552  WBC 7.5 7.3 6.7  HGB 11.0* 11.2* 11.0*  HCT 32.9* 33.7* 33.7*  MCV 94.5 96.8 96.6  PLT 170 139* 677*   Basic Metabolic Panel: Recent Labs  Lab 04/01/21 0506  04/02/21 0648 04/03/21 0552  NA 139 137 138  K 4.4 4.1 4.7  CL 103 101 104  CO2 _0 GLUCOSE 136* 117* 138*  BUN 47* 43* 45*  CREATININE 1.06 1.02 0.94  CALCIUM 9.3 9.0 9.2  MG 2.2 2.2 2.3   GFR: Estimated Creatinine Clearance: 57.5 mL/min (by C-G formula based on SCr of 0.94 mg/dL). Liver Function Tests: No results for input(s): AST, ALT, ALKPHOS, BILITOT, PROT, ALBUMIN in the last 168 hours.  No results for input(s): LIPASE, AMYLASE in the last 168 hours.  No results for input(s): AMMONIA in the last 168 hours. Coagulation Profile: No results for input(s): INR, PROTIME in the last 168 hours. Cardiac Enzymes: No results for input(s): CKTOTAL, CKMB, CKMBINDEX, TROPONINI in the last 168 hours. BNP (last 3 results) No results for input(s): PROBNP in the last 8760 hours. HbA1C: No results for input(s): HGBA1C in the last 72 hours. CBG: No results for input(s): GLUCAP in the last 168 hours. Lipid Profile: No results for input(s): CHOL, HDL, LDLCALC, TRIG, CHOLHDL, LDLDIRECT in the last 72 hours. Thyroid Function Tests: No results for input(s): TSH, T4TOTAL, FREET4, T3FREE, THYROIDAB in the last 72 hours. Anemia Panel: No results for input(s): VITAMINB12, FOLATE, FERRITIN, TIBC, IRON, RETICCTPCT in the last 72 hours. Sepsis Labs: No results for input(s): PROCALCITON, LATICACIDVEN in the last 168 hours.   Recent Results (from the past 240 hour(s))  Resp Panel by RT-PCR (Flu A&B, Covid) Nasopharyngeal Swab     Status: None   Collection Time: 03/26/21  4:24 PM   Specimen: Nasopharyngeal Swab; Nasopharyngeal(NP) swabs in vial transport medium  Result Value Ref Range Status   SARS Coronavirus 2 by RT PCR NEGATIVE NEGATIVE Final    Comment: (NOTE) SARS-CoV-2 target nucleic acids are NOT DETECTED.  The SARS-CoV-2 RNA is generally detectable in upper respiratory specimens during the acute phase of infection. The lowest concentration of SARS-CoV-2 viral copies this assay  can detect is 138 copies/mL. A negative result does not preclude SARS-Cov-2 infection and should not be used as the sole basis for treatment or other patient management decisions. A negative result may occur with  improper specimen collection/handling, submission of specimen other than nasopharyngeal swab, presence of viral mutation(s) within the areas targeted by this assay, and inadequate number of viral copies(<138 copies/mL). A negative result must be combined with clinical observations, patient history, and epidemiological information. The expected result is Negative.  Fact Sheet for Patients:  EntrepreneurPulse.com.au  Fact Sheet for Healthcare Providers:  IncredibleEmployment.be  This test is no t yet approved or cleared by the [place] FDA and  has been authorized for detection and/or diagnosis of SARS-CoV-2 by FDA under an Emergency Use Authorization (EUA). This EUA will remain  in effect (meaning this test can be used) for the duration of the COVID-19 declaration under Section 564(b)(1) of the Act, 21 U.S.C.section 360bbb-3(b)(1), unless the authorization is terminated  or revoked sooner.       Influenza A by PCR NEGATIVE NEGATIVE Final   Influenza B by PCR NEGATIVE NEGATIVE Final    Comment: (NOTE) The Xpert Xpress SARS-CoV-2/FLU/RSV plus assay is intended as an aid in the  diagnosis of influenza from Nasopharyngeal swab specimens and should not be used as a sole basis for treatment. Nasal washings and aspirates are unacceptable for Xpert Xpress SARS-CoV-2/FLU/RSV testing.  Fact Sheet for Patients: EntrepreneurPulse.com.au  Fact Sheet for Healthcare Providers: IncredibleEmployment.be  This test is not yet approved or cleared by the [place] FDA and has been authorized for detection and/or diagnosis of SARS-CoV-2 by FDA under an Emergency Use Authorization (EUA). This EUA will  remain in effect (meaning this test can be used) for the duration of the COVID-19 declaration under Section 564(b)(1) of the Act, 21 U.S.C. section 360bbb-3(b)(1), unless the authorization is terminated or revoked.  Performed at [facility], [place], [place] [address]   Respiratory (~20 pathogens) panel by PCR     Status: None   Collection Time: 03/26/21  6:40 PM   Specimen: Nasopharyngeal Swab; Respiratory  Result Value Ref Range Status   Adenovirus NOT DETECTED NOT DETECTED Final   Coronavirus 229E NOT DETECTED NOT DETECTED Final    Comment: (NOTE) The Coronavirus on the Respiratory Panel, DOES NOT test for the novel  Coronavirus (2019 nCoV)    Coronavirus HKU1 NOT DETECTED NOT DETECTED Final   Coronavirus NL63 NOT DETECTED NOT DETECTED Final   Coronavirus OC43 NOT DETECTED NOT DETECTED Final   Metapneumovirus NOT DETECTED NOT DETECTED Final   Rhinovirus / Enterovirus NOT DETECTED NOT DETECTED Final   Influenza A NOT DETECTED NOT DETECTED Final   Influenza B NOT DETECTED NOT DETECTED Final   Parainfluenza Virus 1 NOT DETECTED NOT DETECTED Final   Parainfluenza Virus 2 NOT DETECTED NOT DETECTED Final   Parainfluenza Virus 3 NOT DETECTED NOT DETECTED Final   Parainfluenza Virus 4 NOT DETECTED NOT DETECTED Final   Respiratory Syncytial Virus NOT DETECTED NOT DETECTED Final   Bordetella pertussis NOT DETECTED NOT DETECTED Final   Bordetella Parapertussis NOT DETECTED NOT DETECTED Final   Chlamydophila pneumoniae NOT DETECTED NOT DETECTED Final   Mycoplasma pneumoniae NOT DETECTED NOT DETECTED Final    Comment: Performed at [facility], [place]. [address], [place], [place] [address]      Radiology Studies: No results found.   Scheduled Meds:  apixaban  5 mg Oral BID   atorvastatin  40 mg Oral QHS   baclofen  10 mg Oral TID   carvedilol  25 mg Oral BID WC   feeding supplement  237 mL Oral TID BM   gabapentin  600 mg Oral TID   lidocaine  1  patch Transdermal Q24H   multivitamin with minerals  1 tablet Oral Daily   oxyCODONE  20 mg Oral Q8H   pantoprazole  40 mg Oral QHS   polyethylene glycol  17 g Oral BID   senna-docusate  1 tablet Oral BID   valACYclovir  500 mg Oral Daily   Continuous Infusions:   LOS: 8 days     Enzo Bi, MD [facility] If 7PM-7AM, please contact night-coverage 04/04/2021, 6:02 PM

## 2021-04-05 DIAGNOSIS — J9601 Acute respiratory failure with hypoxia: Secondary | ICD-10-CM | POA: Diagnosis not present

## 2021-04-05 NOTE — Progress Notes (Signed)
PROGRESS NOTE    Jack [name]  MRN:[id number] DOB: 12/28/1932 DOA: 03/26/2021 PCP: Bronstein, David, MD  [id number]   Assessment & Plan:   Principal Problem:   Acute hypoxemic respiratory failure (HCC) Active Problems:   Essential hypertension   GERD   Prostate hypertrophy   Hyperlipidemia   Constipation   Multiple myeloma not having achieved remission (HCC)   Right flank pain   Compression fracture of body of thoracic vertebra (HCC)   Rodert H [name] is a 87 y.o. male with medical history significant for paroxysmal atrial fibrillation, PACs, coronary calcium and aortic atherosclerosis, diastolic dysfunction, G1DD, GERD, aortic root dilatation, ascending aortic dilatation, hypertension, GERD, hypertriglyceridemia, CKD stage IIIb, BPH, multiple myeloma, prior tobacco use, presented to the emergency department from oncology clinic for right flank swelling and Neoplasm-related pain.    Severe back pain radiating to the right flank suspected due to pathology compression fracture T12--L4 Radiculopathy from multiple myeloma within thoracic lumbar spine --patient has history of compression fractures in the past underwent kyphoplasty x2 (per old record) --oncology following --started on high dose steroid and Valtrex --started on palliative radiation therapy --Dr menz evaluated and no need for Kyphoplasty at this time. Plan: --pain management per oncology, continue oxycontin 20 mg to TID, IV morphine PRN, Percocet PRN, IV toradol PRN --cont baclofen TID --cont gabapentin 600 mg TID --palliative radiation tx per radiation oncology   Stage III kappa chain myeloma --palliative radiation tx per radiation oncology  constipation, resolved --resolved with aggressive Miralax --daily Miralax scheduled   Hypertension --BP varied widely --cont coreg   history of a fib --cont coreg --cont Eliquis    Nutrition Status: Nutrition Problem: Severe Malnutrition (in the context of  chronic illness) Etiology: decreased appetite Signs/Symptoms: severe muscle depletion, severe fat depletion, percent weight loss (17% weight loss x 3 months) Percent weight loss: 17 % Interventions: Ensure Enlive (each supplement provides 350kcal and 20 grams of protein), MVI, Liberalize Diet   DVT prophylaxis: Lovenox SQ Code Status: Full code  Family Communication: wife and son updated at bedside today Level of care: Med-Surg Dispo:   The patient is from: home Anticipated d/c is to: home Anticipated d/c date is: after 8 session of radiation treatment  Patient currently is not medically ready to d/c due to: pt's movement limited by pain, pt and wife do not believe they can manage at home.   Subjective and Interval History:  Nursing reported pt refused to get up and out of bed today.  When I encouraged pt to get up as much as he can when offered, pt said he didn't remember.  Ate well.  Pain controlled when in bed.   Objective: Vitals:   04/06/21 0532 04/06/21 0726 04/06/21 1534 04/06/21 2034  BP: (!) 147/73 109/69 119/81 136/77  Pulse: 68 70 68 67  Resp: 16 16 20 20  Temp: 97.8 F (36.6 C) 98.2 F (36.8 C) 97.9 F (36.6 C) (!) 97.4 F (36.3 C)  TempSrc: Oral  Oral Oral  SpO2: 96% 92% 95% 96%  Weight: 75.9 kg     Height:        Intake/Output Summary (Last 24 hours) at 04/07/2021 0044 Last data filed at 04/06/2021 1843 Gross per 24 hour  Intake 600 ml  Output 900 ml  Net -300 ml   Filed Weights   04/03/21 0308 04/04/21 0429 04/06/21 0532  Weight: 74.8 kg 75.6 kg 75.9 kg    Examination:   Constitutional: NAD, AAOx3, lying in bed HEENT:   conjunctivae and lids normal, EOMI CV: No cyanosis.   RESP: normal respiratory effort, on RA Extremities: No effusions, edema in BLE SKIN: warm, dry Neuro: II - XII grossly intact.     Data Reviewed: I have personally reviewed following labs and imaging studies  CBC: Recent Labs  Lab 04/01/21 0506 04/02/21 0648  04/03/21 0552  WBC 7.5 7.3 6.7  HGB 11.0* 11.2* 11.0*  HCT 32.9* 33.7* 33.7*  MCV 94.5 96.8 96.6  PLT 170 139* 007*   Basic Metabolic Panel: Recent Labs  Lab 04/01/21 0506 04/02/21 0648 04/03/21 0552  NA 139 137 138  K 4.4 4.1 4.7  CL 103 101 104  CO2 _0 GLUCOSE 136* 117* 138*  BUN 47* 43* 45*  CREATININE 1.06 1.02 0.94  CALCIUM 9.3 9.0 9.2  MG 2.2 2.2 2.3   GFR: Estimated Creatinine Clearance: 57.5 mL/min (by C-G formula based on SCr of 0.94 mg/dL). Liver Function Tests: No results for input(s): AST, ALT, ALKPHOS, BILITOT, PROT, ALBUMIN in the last 168 hours.  No results for input(s): LIPASE, AMYLASE in the last 168 hours.  No results for input(s): AMMONIA in the last 168 hours. Coagulation Profile: No results for input(s): INR, PROTIME in the last 168 hours. Cardiac Enzymes: No results for input(s): CKTOTAL, CKMB, CKMBINDEX, TROPONINI in the last 168 hours. BNP (last 3 results) No results for input(s): PROBNP in the last 8760 hours. HbA1C: No results for input(s): HGBA1C in the last 72 hours. CBG: No results for input(s): GLUCAP in the last 168 hours. Lipid Profile: No results for input(s): CHOL, HDL, LDLCALC, TRIG, CHOLHDL, LDLDIRECT in the last 72 hours. Thyroid Function Tests: No results for input(s): TSH, T4TOTAL, FREET4, T3FREE, THYROIDAB in the last 72 hours. Anemia Panel: No results for input(s): VITAMINB12, FOLATE, FERRITIN, TIBC, IRON, RETICCTPCT in the last 72 hours. Sepsis Labs: No results for input(s): PROCALCITON, LATICACIDVEN in the last 168 hours.   No results found for this or any previous visit (from the past 240 hour(s)).     Radiology Studies: No results found.   Scheduled Meds:  apixaban  5 mg Oral BID   atorvastatin  40 mg Oral QHS   baclofen  10 mg Oral TID   carvedilol  25 mg Oral BID WC   feeding supplement  237 mL Oral TID BM   gabapentin  600 mg Oral TID   lidocaine  1 patch Transdermal Q24H   multivitamin with  minerals  1 tablet Oral Daily   pantoprazole  40 mg Oral QHS   polyethylene glycol  17 g Oral BID   senna-docusate  1 tablet Oral BID   valACYclovir  500 mg Oral Daily   Continuous Infusions:   LOS: 11 days     Enzo Bi, MD [facility] If 7PM-7AM, please contact night-coverage 04/07/2021, 12:44 AM

## 2021-04-06 ENCOUNTER — Inpatient Hospital Stay: Payer: PPO

## 2021-04-06 ENCOUNTER — Other Ambulatory Visit: Payer: Self-pay | Admitting: Oncology

## 2021-04-06 ENCOUNTER — Ambulatory Visit: Payer: PPO

## 2021-04-06 DIAGNOSIS — C9000 Multiple myeloma not having achieved remission: Secondary | ICD-10-CM

## 2021-04-06 DIAGNOSIS — Z5112 Encounter for antineoplastic immunotherapy: Secondary | ICD-10-CM | POA: Diagnosis not present

## 2021-04-06 DIAGNOSIS — J9601 Acute respiratory failure with hypoxia: Secondary | ICD-10-CM | POA: Diagnosis not present

## 2021-04-06 MED ORDER — BORTEZOMIB CHEMO SQ INJECTION 3.5 MG (2.5MG/ML)
1.3000 mg/m2 | Freq: Once | INTRAMUSCULAR | Status: AC
  Administered 2021-04-06: 2.5 mg via SUBCUTANEOUS
  Filled 2021-04-06: qty 1

## 2021-04-06 MED ORDER — DEXAMETHASONE 4 MG PO TABS
20.0000 mg | ORAL_TABLET | Freq: Once | ORAL | Status: AC
  Administered 2021-04-06: 20 mg via ORAL
  Filled 2021-04-06: qty 5

## 2021-04-06 NOTE — Progress Notes (Signed)
[place]  [phone] Fax:([phone]  ID: [name] OB: 11-Apr-1933  MR#: [id number]  GYJ#:[id number]  Patient Care Team: [name], MD as PCP - General (Family Medicine) End, [name], MD as PCP - Cardiology (Cardiology) [name], MD (Endocrinology)  CHIEF COMPLAINT: Stage III kappa chain myeloma.  INTERVAL HISTORY: Patient lethargic today, possibly overmedicated with change in narcotic dose.  Wife is at bedside reporting he is still in significant pain despite being difficult to wake up.  No other reported complaints.  REVIEW OF SYSTEMS:   Review of Systems  Constitutional:  Positive for malaise/fatigue and weight loss. Negative for fever.  Respiratory: Negative.  Negative for cough, hemoptysis and shortness of breath.   Cardiovascular: Negative.  Negative for chest pain and leg swelling.  Gastrointestinal: Negative.  Negative for abdominal pain and nausea.  Genitourinary: Negative.  Negative for dysuria.  Musculoskeletal:  Positive for back pain.  Skin: Negative.  Negative for itching and rash.  Neurological:  Positive for weakness. Negative for dizziness and focal weakness.  Psychiatric/Behavioral: Negative.  The patient is not nervous/anxious.    As per HPI. Otherwise, a complete review of systems is negative.  PAST MEDICAL HISTORY: Past Medical History:  Diagnosis Date   Elevated prostate specific antigen (PSA)    has been 7 for a year    GERD (gastroesophageal reflux disease)    History of colon polyps 2008   [facility],    History of kidney stones    Hyperlipidemia    Hypertension    Prostate hypertrophy    diagnosed at age 50 due to hematospermia   Renal disorder     PAST SURGICAL HISTORY: Past Surgical History:  Procedure Laterality Date   CATARACT EXTRACTION W/PHACO Left 01/10/2018   Procedure: CATARACT EXTRACTION PHACO AND INTRAOCULAR LENS PLACEMENT ([place]);  Surgeon: Birder Robson, MD;   Location: [facility] ORS;  Service: Ophthalmology;  Laterality: Left;  [name] 00:24.8 AP% 14.9 CDE 3.68 Fluid pack lot # [id number]   CATARACT EXTRACTION W/PHACO Right 01/25/2018   Procedure: CATARACT EXTRACTION PHACO AND INTRAOCULAR LENS PLACEMENT (IOC);  Surgeon: Birder Robson, MD;  Location: [facility];  Service: Ophthalmology;  Laterality: Right;  [name] 00:42 AP% 10.8 CDE 4.59 Fluid pack lot # [id number]   COLON SURGERY     CYSTOSCOPY W/ URETERAL STENT PLACEMENT Right 10/16/2017   Procedure: right  URETERAL STENT PLACEMENT,cystoscopy bilateral stent removal,rretrograde;  Surgeon: Abbie Sons, MD;  Location: [facility];  Service: Urology;  Laterality: Right;   CYSTOSCOPY/URETEROSCOPY/HOLMIUM LASER/STENT PLACEMENT Right 12/16/2020   Procedure: CYSTOSCOPY/URETEROSCOPY/HOLMIUM LASER/STENT PLACEMENT;  Surgeon: Abbie Sons, MD;  Location: [facility];  Service: Urology;  Laterality: Right;   EXTRACORPOREAL SHOCK WAVE LITHOTRIPSY Right 12/11/2020   Procedure: EXTRACORPOREAL SHOCK WAVE LITHOTRIPSY (ESWL);  Surgeon: Abbie Sons, MD;  Location: [facility];  Service: Urology;  Laterality: Right;   IR KYPHO EA ADDL LEVEL THORACIC OR LUMBAR  02/02/2021   IR KYPHO LUMBAR INC FX REDUCE BONE BX UNI/BIL CANNULATION INC/IMAGING  02/02/2021   KIDNEY STONE SURGERY     KYPHOPLASTY N/A 03/12/2021   Procedure: Hewitt Shorts, L3;  Surgeon: Hessie Knows, MD;  Location: [facility];  Service: Orthopedics;  Laterality: N/A;   RESECTION SOFT TISSUE TUMOR LEG / ANKLE RADICAL  jan 2009   [facility],  right thigh/knee , nonmalignant   SMALL INTESTINE SURGERY  1946   implaed on picket fence, punctured stomach   TONSILLECTOMY      FAMILY HISTORY: Family History  Problem Relation Age of Onset   Hypertension Father    Hyperlipidemia Father    Cancer Sister        thyroid - dx in late 20's    ADVANCED DIRECTIVES (Y/N):  $Remov'@[email or web address]'[id number]$ @  HEALTH MAINTENANCE: Social History   Tobacco Use   Smoking status: Former    Types: Cigarettes     Quit date: 07/05/1965    Years since quitting: 55.7   Smokeless tobacco: Never  Vaping Use   Vaping Use: Never used  Substance Use Topics   Alcohol use: Yes    Alcohol/week: 1.0 standard drink    Types: 1 Standard drinks or equivalent per week    Comment: occassionaly   Drug use: No     Colonoscopy:  PAP:  Bone density:  Lipid panel:  Allergies  Allergen Reactions   Quinolones     Aortic dilation contraindicates FQ   Amlodipine Other (See Comments)    LE edema   Azithromycin Nausea And Vomiting   Lisinopril Cough   Morphine And Related Nausea And Vomiting    Current Facility-Administered Medications  Medication Dose Route Frequency Provider Last Rate Last Admin   apixaban (ELIQUIS) tablet 5 mg  5 mg Oral BID Dallie Piles, RPH   5 mg at 04/06/21 0835   atorvastatin (LIPITOR) tablet 40 mg  40 mg Oral QHS Cox, Amy N, DO   40 mg at 04/05/21 2159   baclofen (LIORESAL) tablet 10 mg  10 mg Oral TID Fritzi Mandes, MD   10 mg at 04/06/21 0836   bisacodyl (DULCOLAX) suppository 10 mg  10 mg Rectal Daily PRN Fritzi Mandes, MD   10 mg at 03/29/21 1521   carvedilol (COREG) tablet 25 mg  25 mg Oral BID WC Cox, Amy N, DO   25 mg at 04/06/21 0835   docusate sodium (COLACE) capsule 100 mg  100 mg Oral BID PRN Fritzi Mandes, MD       feeding supplement (ENSURE ENLIVE / ENSURE PLUS) liquid 237 mL  237 mL Oral TID BM Enzo Bi, MD   237 mL at 04/06/21 0838   gabapentin (NEURONTIN) capsule 600 mg  600 mg Oral TID Enzo Bi, MD   600 mg at 04/06/21 0835   hydrALAZINE (APRESOLINE) injection 10 mg  10 mg Intravenous Q4H PRN Rise Patience, MD   10 mg at 04/02/21 0406   lidocaine (LIDODERM) 5 % 1 patch  1 patch Transdermal Q24H Cox, Amy N, DO   1 patch at 04/05/21 2159   multivitamin with minerals tablet 1 tablet  1 tablet Oral Daily Fritzi Mandes, MD   1 tablet at 04/06/21 0835   ondansetron (ZOFRAN) tablet 4 mg  4 mg Oral Q6H PRN Cox, Amy N, DO       Or   ondansetron (ZOFRAN) injection 4 mg  4  mg Intravenous Q6H PRN Cox, Amy N, DO       oxyCODONE-acetaminophen (PERCOCET/ROXICET) 5-325 MG per tablet 1-2 tablet  1-2 tablet Oral Q4H PRN Fritzi Mandes, MD   2 tablet at 04/03/21 1735   pantoprazole (PROTONIX) EC tablet 40 mg  40 mg Oral QHS Charlaine Dalton R, MD   40 mg at 04/05/21 2159   polyethylene glycol (MIRALAX / GLYCOLAX) packet 17 g  17 g Oral BID Enzo Bi, MD   17 g at 04/06/21 0836   senna-docusate (Senokot-S) tablet 1 tablet  1 tablet Oral BID Cox, Amy N, DO   1 tablet at 04/06/21 [id number]  traMADol (ULTRAM) tablet 50 mg  50 mg Oral Q6H PRN Fritzi Mandes, MD   50 mg at 04/05/21 1313   valACYclovir (VALTREX) tablet 500 mg  500 mg Oral Daily Charlaine Dalton R, MD   500 mg at 04/06/21 0836    OBJECTIVE: Vitals:   04/06/21 0726 04/06/21 1534  BP: 109/69 119/81  Pulse: 70 68  Resp: 16 20  Temp: 98.2 F (36.8 C) 97.9 F (36.6 C)  SpO2: 92% 95%     Body mass index is 24.71 kg/m.    ECOG FS:3 - Symptomatic, >50% confined to bed  General: Lethargic, no acute distress. Eyes: Pink conjunctiva, anicteric sclera. HEENT: Normocephalic, moist mucous membranes. Lungs: No audible wheezing or coughing. Heart: Regular rate and rhythm. Abdomen: Soft, nontender, no obvious distention. Musculoskeletal: No edema, cyanosis, or clubbing. Neuro: Lethargic, difficult to awake. Skin: No rashes or petechiae noted.   LAB RESULTS:  Lab Results  Component Value Date   NA 138 04/03/2021   K 4.7 04/03/2021   CL 104 04/03/2021   CO2 28 04/03/2021   GLUCOSE 138 (H) 04/03/2021   BUN 45 (H) 04/03/2021   CREATININE 0.94 04/03/2021   CALCIUM 9.2 04/03/2021   PROT 6.0 (L) 03/26/2021   ALBUMIN 3.8 03/26/2021   AST 41 03/26/2021   ALT 25 03/26/2021   ALKPHOS 104 03/26/2021   BILITOT 1.4 (H) 03/26/2021   GFRNONAA >60 04/03/2021   GFRAA 29 (L) 10/16/2017    Lab Results  Component Value Date   WBC 6.7 04/03/2021   NEUTROABS 5.2 03/26/2021   HGB 11.0 (L) 04/03/2021   HCT 33.7 (L)  04/03/2021   MCV 96.6 04/03/2021   PLT 122 (L) 04/03/2021     STUDIES: DG Lumbar Spine 2-3 Views  Result Date: 03/12/2021 CLINICAL DATA:  Provided history: Fracture. Additional history provided: L2-L3 kyphoplasty. Provided fluoroscopy time 2 minutes, 27 seconds. EXAM: LUMBAR SPINE - 2-3 VIEW; DG C-ARM 1-60 MIN COMPARISON:  CT abdomen/pelvis 03/11/2021. FINDINGS: AP and lateral view intraprocedural fluoroscopic images of the lumbar spine are submitted, 2 images total. On the provided images, kyphoplasty material is present within three contiguous lumbar vertebral bodies (these are presumably the L1, L2 and L3 vertebral bodies given the provided history and findings on prior imaging. However, neither the thoracolumbar nor lumbosacral junction are included in the field of view to allow exact level determination). IMPRESSION: Two intraprocedural fluoroscopic images of the lumbar spine from reported L2 and L3 kyphoplasty, as described. Electronically Signed   By: Kellie Simmering DO   On: 03/12/2021 18:03   MR THORACIC SPINE W WO CONTRAST  Result Date: 03/29/2021 CLINICAL DATA:  Initial evaluation for hematologic malignancy, staging. Multiple myeloma. EXAM: MRI THORACIC WITHOUT AND WITH CONTRAST TECHNIQUE: Multiplanar and multiecho pulse sequences of the thoracic spine were obtained without and with intravenous contrast. CONTRAST:  24mL GADAVIST GADOBUTROL 1 MMOL/ML IV SOLN COMPARISON:  MRI of the lumbar spine from 03/27/2021 as well as previous MRI from 01/29/2021. FINDINGS: Alignment: Physiologic with preservation of the normal thoracic kyphosis. Vertebrae: Abnormal heterogeneous signal intensity seen throughout the visualized bone marrow, presumably related to history of multiple myeloma. There is new abnormal STIR signal intensity involving the T10 and T11 vertebral bodies, new as compared to prior thoracic spine MRI from 01/29/2021, suggesting progressive disease. There is an associated pathologic  compression fracture at the inferior endplate of R15 with no more than mild central height loss and trace 2 mm bony retropulsion, new from prior. The marrow edema at  this level is likely at least in part related to the fracture. 1 cm STIR hyperintense lesion involving the T8 vertebral body is stable. Additional subcentimeter STIR hyperintense lesions involving the T6 and T7 vertebral bodies also similar. T9 hemangioma noted. Additional compression deformities involving the T12 through L3 vertebral bodies with sequelae of prior vertebral augmentation again noted. Mild chronic compression deformities at the superior endplates of T3 and T4 are stable. Vertebral body height otherwise maintained. Cord: Normal signal and morphology. Previously identified soft tissue density within the ventral epidural space extending from L1 through L2-3 again noted, better characterized on prior MRI of the lumbar spine. No other extra osseous extension of tumor seen within the thoracic spine. Paraspinal and other soft tissues: Unremarkable. Disc levels: Mild for age multilevel disc desiccation seen throughout the thoracic spine. No significant disc bulge or focal disc herniation. No spinal stenosis. Foramina remain patent. IMPRESSION: 1. Abnormal heterogeneous signal intensity throughout the visualized bone marrow, consistent with history of multiple myeloma. Increased STIR signal intensity within the T10 and T11 vertebral bodies since prior thoracic spine MRI from 01/29/2021, suggesting progressive disease. 2. New pathologic compression fracture at the inferior endplate of G95 with no more than mild central height loss and trace 2 mm bony retropulsion. No stenosis. 3. Small amount of extra osseous tumor within the ventral epidural space at L1 through L2-3, better characterized on prior lumbar spine MRI. No other epidural or extraosseous tumor within the thoracic spine. 4. No significant canal or foraminal stenosis. Electronically Signed    By: Jeannine Boga M.D.   On: 03/29/2021 04:31   MR Lumbar Spine W Wo Contrast  Result Date: 03/27/2021 CLINICAL DATA:  Low back pain, history of multiple myeloma EXAM: MRI LUMBAR SPINE WITHOUT AND WITH CONTRAST TECHNIQUE: Multiplanar and multiecho pulse sequences of the lumbar spine were obtained without and with intravenous contrast. CONTRAST:  7.52mL GADAVIST GADOBUTROL 1 MMOL/ML IV SOLN COMPARISON:  01/29/2021 FINDINGS: Segmentation:  Standard. Alignment:  No significant listhesis. Vertebrae: There are pathologic compression fractures at T12-L4. Progressed since the prior MRI but present on other interval imaging with exception of L4. Cement augmentation is present at T12-L3. Minor endplate retropulsion, greatest at T12 and L1. There is mildly heterogeneous marrow signal with patchy STIR hyperintensity and enhancement. New mild ventral epidural soft tissue extension is present at L1 to L2-L3. Conus medullaris and cauda equina: Conus extends to the L1-L2 level. Conus and cauda equina appear normal. No abnormal intrathecal enhancement. Paraspinal and other soft tissues: Unremarkable. Disc levels: L1-L2:  No significant canal or foraminal stenosis. L2-L3: Disc bulge. Facet arthropathy with ligamentum flavum infolding. No significant canal or foraminal stenosis. L3-L4: Disc bulge. Facet arthropathy with ligamentum flavum infolding. No significant canal or foraminal stenosis. L4-L5: Disc bulge. Facet arthropathy with ligamentum flavum infolding. No significant canal or foraminal stenosis. L5-S1:  No significant canal or foraminal stenosis. IMPRESSION: Pathologic compression fractures of T12-L4 with cement augmentation at T12-L3. Heterogeneous marrow signal a and enhancement likely reflecting known myeloma. There is new mild ventral epidural soft tissue extension at L1 to L2-L3. No significant canal or foraminal narrowing at any level. Electronically Signed   By: Macy Mis M.D.   On: 03/27/2021 14:39    CT ABDOMEN PELVIS W CONTRAST  Result Date: 03/26/2021 CLINICAL DATA:  Abdominal distension and abdominal pain EXAM: CT ABDOMEN AND PELVIS WITH CONTRAST TECHNIQUE: Multidetector CT imaging of the abdomen and pelvis was performed using the standard protocol following bolus administration of intravenous contrast. CONTRAST:  133m OMNIPAQUE IOHEXOL 350 MG/ML SOLN COMPARISON:  03/11/2021 FINDINGS: Lower chest: Unremarkable. Hepatobiliary: No suspicious focal abnormality within the liver parenchyma. There is no evidence for gallstones, gallbladder wall thickening, or pericholecystic fluid. No intrahepatic or extrahepatic biliary dilation. Pancreas: No focal mass lesion. No dilatation of the main duct. No intraparenchymal cyst. No peripancreatic edema. Spleen: No splenomegaly. No focal mass lesion. Adrenals/Urinary Tract: No adrenal nodule or mass. Kidneys unremarkable. No evidence for hydroureter. The urinary bladder appears normal for the degree of distention. Stomach/Bowel: Tiny hiatal hernia. Stomach otherwise unremarkable. Duodenum is normally positioned as is the ligament of Treitz. No small bowel wall thickening. No small bowel dilatation. The terminal ileum is normal. The appendix is normal. No gross colonic mass. No colonic wall thickening. No substantial diverticular disease in the left colon. Colon is diffusely fluid-filled with fluid visible in the rectum, imaging findings that could be consistent diarrhea. No colonic wall thickening. Vascular/Lymphatic: There is moderate atherosclerotic calcification of the abdominal aorta without aneurysm. There is no gastrohepatic or hepatoduodenal ligament lymphadenopathy. No retroperitoneal or mesenteric lymphadenopathy. No pelvic sidewall lymphadenopathy. Reproductive: Prostate gland is markedly enlarged. Other: No intraperitoneal free fluid. Musculoskeletal: Mesh in the left groin region is compatible with prior herniorrhaphy. Bony mineralization is diffusely  heterogeneous in this patient with a history of multiple myeloma. Patient is status post multiple level vertebral augmentation in the lower thoracic and lumbar spine. IMPRESSION: 1. No acute findings in the abdomen or pelvis. 2. Fluid in the right and transverse colon with visible fluid in the rectum. Imaging findings could be consistent with diarrhea. No evidence for colonic wall thickening to suggest colitis. 3. Marked prostatomegaly. 4. Tiny hiatal hernia. 5. Aortic Atherosclerosis (ICD10-I70.0). Electronically Signed   By: EMisty StanleyM.D.   On: 03/26/2021 18:09   CT ABDOMEN PELVIS W CONTRAST  Result Date: 03/11/2021 CLINICAL DATA:  87year old male with abdominal pain. Recent diagnosis of MGUS, concern for multiple myeloma. EXAM: CT ABDOMEN AND PELVIS WITH CONTRAST TECHNIQUE: Multidetector CT imaging of the abdomen and pelvis was performed using the standard protocol following bolus administration of intravenous contrast. CONTRAST:  1028mOMNIPAQUE IOHEXOL 300 MG/ML  SOLN COMPARISON:  Noncontrast CT Abdomen and Pelvis 01/29/2021 and earlier. Thoracic and lumbar MRI 01/29/2021. FINDINGS: Lower chest: Stable small to moderate hiatal hernia. Tortuous thoracic aorta. Calcified coronary artery atherosclerosis. No cardiomegaly or pericardial effusion. No pleural effusion. Stable mild lung base atelectasis or scarring. Hepatobiliary: Gallbladder is at the upper limits of normal but does not appear inflamed. Negative liver. No bile duct enlargement. Pancreas: Negative. Spleen: Negative. Adrenals/Urinary Tract: Normal adrenal glands. Nonobstructed kidneys with symmetric renal enhancement and contrast excretion. There is probable small benign left renal parapelvic cyst accounting for the increased left renal hilum soft tissue on series 7, image 26, stable since 2018. Decompressed ureters. Decompressed and negative bladder. Stomach/Bowel: Negative large bowel aside from retained stool in the right colon. Diminutive,  negative appendix on coronal image 48. Flocculated material in the terminal ileum but no dilated small bowel. Aside from hiatal hernia the stomach is negative. Negative duodenum. No free air or free fluid. Vascular/Lymphatic: Aortoiliac calcified atherosclerosis. Major arterial structures remain patent in the abdomen and pelvis. Tortuous iliac arteries. No lymphadenopathy. Reproductive: Prostatomegaly. Previous left inguinal hernia repair with mesh appears stable. Otherwise negative. Other: No pelvic free fluid. Musculoskeletal: Diffuse osteopenia and heterogeneous bone mineralization similar to the CT last month, distinctly different from a 2018 CT. Interval augmentation of the T12 and L1 compression fractures. New/progressed compression  fractures of L2 and L3 since last month. No significant retropulsion of bone or other complicating features. Bone mineralization is especially heterogeneous in the lumbar spine and pelvis, and these are likely pathologic fractures in the setting of MGUS/myeloma. No other acute osseous abnormality identified. IMPRESSION: 1. Diffusely abnormal bone mineralization in keeping with MGUS/Multiple Myeloma with new or progressed pathologic compression fractures of L2 and L3 since last month. Interval augmentation of T12 and L1. 2. No other acute finding in the abdomen or pelvis. Small to moderate hiatal hernia. Prostatomegaly. Aortic Atherosclerosis (ICD10-I70.0). Electronically Signed   By: Genevie Ann M.D.   On: 03/11/2021 09:53   NM PET Image Initial (PI) Whole Body  Result Date: 03/24/2021 CLINICAL DATA:  Initial treatment strategy for concern for multiple myeloma. Gammopathy with multiple M spikes. Prior kyphoplasty 12/16/2020 at L2-3. EXAM: NUCLEAR MEDICINE PET WHOLE BODY TECHNIQUE: 9.8 mCi F-18 FDG was injected intravenously. Full-ring PET imaging was performed from the head to foot after the radiotracer. CT data was obtained and used for attenuation correction and anatomic  localization. Fasting blood glucose: 91 mg/dl COMPARISON:  03/11/2021 abdominopelvic CT.  Chest CT 11/21/2017. FINDINGS: Mediastinal blood pool activity: SUV max 3.1 HEAD/NECK: No areas of abnormal hypermetabolism. Incidental CT findings: Cerebral atrophy is within normal variation for age. No cervical adenopathy. Left carotid atherosclerotic calcifications. CHEST: No thoracic nodal hypermetabolism. Distal esophageal hypermetabolism in the setting of a small hiatal hernia. This measures a S.U.V. max of 4.6, including on approximately 139/3. No pulmonary parenchymal hypermetabolism. Incidental CT findings: Aortic and coronary artery calcification. Ascending aortic dilatation again identified, including up to 4.6 cm. Mild cardiomegaly. Calcified left-sided pleural plaques. Centrilobular emphysema. No thoracic adenopathy. ABDOMEN/PELVIS: No abdominopelvic parenchymal or nodal hypermetabolism. Incidental CT findings: Normal adrenal glands. Proximal gastric underdistention. No abdominopelvic adenopathy. Moderate prostatomegaly. SKELETON: Multifocal thoracolumbar spine hypermetabolism. This is seen at the previously augmented levels and likely reactive. However, there is also hypermetabolism within the T11 vertebral body at a S.U.V. max of 5.1. Incidental CT findings: Diffuse heterogeneous osseous density, most consistent with myeloma involvement. EXTREMITIES: No areas of abnormal hypermetabolism. Incidental CT findings: Loose bodies about the right knee are likely within a popliteal cyst. Subtle cortical irregularity which is most apparent in the femoral, less so humeral shafts, suspicious for myeloma involvement. IMPRESSION: 1. Heterogeneity throughout the marrow space, most consistent with the clinical history of multiple myeloma. The only suspicious area of osseous hypermetabolism is within the T11 vertebral body. 2. No findings of extra-osseous hypermetabolic myeloma. 3. Distal esophageal hypermetabolism, suspicious  for esophagitis. Concurrent small hiatal hernia. 4. Incidental findings, including: Similar ascending aortic aneurysm. Aortic atherosclerosis (ICD10-I70.0), coronary artery atherosclerosis and emphysema (ICD10-J43.9). Left-sided pleural partially calcified pleural plaques may relate to prior empyema or hemothorax. Prostatomegaly. Electronically Signed   By: Abigail Miyamoto M.D.   On: 03/24/2021 16:05   DG Chest Portable 1 View  Result Date: 03/26/2021 CLINICAL DATA:  87 year old male with right flank pain and rib fracture. EXAM: PORTABLE CHEST 1 VIEW COMPARISON:  Chest radiograph dated 02/02/2021. FINDINGS: No focal consolidation, pleural effusion, or pneumothorax. Apparent two small adjacent calcified nodules in the left upper lobe with combined dimension of 9 mm. Attention on follow-up imaging recommended. The cardiac silhouette is within limits. No acute osseous pathology. IMPRESSION: 1. No acute cardiopulmonary process. 2. A 9 mm left upper lobe calcified nodule. Electronically Signed   By: Anner Crete M.D.   On: 03/26/2021 19:08   DG C-Arm 1-60 Min  Result Date: 03/12/2021 CLINICAL  DATA:  Provided history: Fracture. Additional history provided: L2-L3 kyphoplasty. Provided fluoroscopy time 2 minutes, 27 seconds. EXAM: LUMBAR SPINE - 2-3 VIEW; DG C-ARM 1-60 MIN COMPARISON:  CT abdomen/pelvis 03/11/2021. FINDINGS: AP and lateral view intraprocedural fluoroscopic images of the lumbar spine are submitted, 2 images total. On the provided images, kyphoplasty material is present within three contiguous lumbar vertebral bodies (these are presumably the L1, L2 and L3 vertebral bodies given the provided history and findings on prior imaging. However, neither the thoracolumbar nor lumbosacral junction are included in the field of view to allow exact level determination). IMPRESSION: Two intraprocedural fluoroscopic images of the lumbar spine from reported L2 and L3 kyphoplasty, as described. Electronically Signed    By: Kellie Simmering DO   On: 03/12/2021 18:03    ASSESSMENT: Stage III kappa chain myeloma.  PLAN:    1.  Stage III kappa chain myeloma: (11:14 translocation, high risk).  Patient was initiated on pulse dose dexamethasone 40 mg daily for 4 days as well as subcutaneous Velcade on days 1, 4, 8, and 11.  Patient received cycle 1, day 11 of treatment today.  This is a 21-day cycle, therefore cycle 2, day 1 will be on April 16, 2021.  Can consider triplet therapy if patient's performance status improves.  Patient may also benefit from Zometa given his bony disease. 2.  Pain: Likely secondary to myeloma.  Appreciate palliative care input.  Continue daily XRT with approximately 7 treatments left.  Has had recent kyphoplasty's as well.  Narcotic dose was increased on Friday, but patient appears to be more lethargic today. 3.  DVT prophylaxis: Patient at high risk for developing blood clot.  Continue Lovenox 40 mg daily. 4.  Anemia: Chronic and unchanged.   5.  Thrombocytopenia: Mild, likely secondary to Velcade.  Monitor.  Will follow.  Lloyd Huger, MD   04/06/2021 5:29 PM

## 2021-04-06 NOTE — Progress Notes (Signed)
PROGRESS NOTE    Jack [name]  KJZ:[id number] DOB: [birth year] DOA: 03/26/2021 PCP: Jack Pitch, MD  [id number]   Assessment & Plan:   Principal Problem:   Acute hypoxemic respiratory failure ([place]) Active Problems:   Essential hypertension   GERD   Prostate hypertrophy   Hyperlipidemia   Constipation   Multiple myeloma not having achieved remission (HCC)   Right flank pain   Compression fracture of body of thoracic vertebra (HCC)   Jack [name] is a 87 y.o. male with medical history significant for paroxysmal atrial fibrillation, PACs, coronary calcium and aortic atherosclerosis, diastolic dysfunction, X4IA, GERD, aortic root dilatation, ascending aortic dilatation, hypertension, GERD, hypertriglyceridemia, CKD stage IIIb, BPH, multiple myeloma, prior tobacco use, presented to the emergency department from oncology clinic for right flank swelling and Neoplasm-related pain.    Severe back pain radiating to the right flank suspected due to pathology compression fracture T12--L4 Radiculopathy from multiple myeloma within thoracic lumbar spine --patient has history of compression fractures in the past underwent kyphoplasty x2 (per old record) --oncology following --started on high dose steroid and Valtrex --started on palliative radiation therapy --Dr Jack [name] evaluated and no need for Kyphoplasty at this time. Plan: --pain management per oncology --hold oxycontin due to increased somnolence --cont Percocet PRN, IV toradol PRN --cont baclofen TID --cont gabapentin 600 mg TID --palliative radiation tx per radiation oncology   Stage III kappa chain myeloma --palliative radiation tx per radiation oncology  constipation, resolved --resolved with aggressive Miralax --daily Miralax scheduled   Hypertension --BP varied widely --cont coreg   history of a fib --cont coreg --cont Eliquis    Nutrition Status: Nutrition Problem: Severe Malnutrition (in the context of  chronic illness) Etiology: decreased appetite Signs/Symptoms: severe muscle depletion, severe fat depletion, percent weight loss (17% weight loss x 3 months) Percent weight loss: 17 % Interventions: Ensure Enlive (each supplement provides 350kcal and 20 grams of protein), MVI, Liberalize Diet   DVT prophylaxis: Lovenox SQ Code Status: Full code  Family Communication:  Level of care: Med-Surg Dispo:   The patient is from: home Anticipated d/c is to: home Anticipated d/c date is: after 8 session of radiation treatment  Patient currently is not medically ready to d/c due to: pt's movement limited by pain, pt and wife do not believe they can manage at home.   Subjective and Interval History:  Pt was found too somnolent to work with PT today.  Oxycotin held.  Pt was more awake later in the day.   Objective: Vitals:   04/06/21 0532 04/06/21 0726 04/06/21 1534 04/06/21 2034  BP: (!) 147/73 109/69 119/81 136/77  Pulse: 68 70 68 67  Resp: '16 16 20 20  ' Temp: 97.8 F (36.6 C) 98.2 F (36.8 C) 97.9 F (36.6 C) (!) 97.4 F (36.3 C)  TempSrc: Oral  Oral Oral  SpO2: 96% 92% 95% 96%  Weight: 75.9 kg     Height:        Intake/Output Summary (Last 24 hours) at 04/07/2021 0231 Last data filed at 04/06/2021 1843 Gross per 24 hour  Intake 600 ml  Output 900 ml  Net -300 ml   Filed Weights   04/03/21 0308 04/04/21 0429 04/06/21 0532  Weight: 74.8 kg 75.6 kg 75.9 kg    Examination:   Constitutional: NAD, awake during my visit, oriented HEENT: conjunctivae and lids normal, EOMI CV: No cyanosis.   RESP: normal respiratory effort, on RA   Data Reviewed: I have personally reviewed following labs  and imaging studies  CBC: Recent Labs  Lab 04/01/21 0506 04/02/21 0648 04/03/21 0552  WBC 7.5 7.3 6.7  HGB 11.0* 11.2* 11.0*  HCT 32.9* 33.7* 33.7*  MCV 94.5 96.8 96.6  PLT 170 139* 677*   Basic Metabolic Panel: Recent Labs  Lab 04/01/21 0506 04/02/21 0648 04/03/21 0552   NA 139 137 138  K 4.4 4.1 4.7  CL 103 101 104  CO2 '27 25 28  ' GLUCOSE 136* 117* 138*  BUN 47* 43* 45*  CREATININE 1.06 1.02 0.94  CALCIUM 9.3 9.0 9.2  MG 2.2 2.2 2.3   GFR: Estimated Creatinine Clearance: 57.5 mL/min (by C-G formula based on SCr of 0.94 mg/dL). Liver Function Tests: No results for input(s): AST, ALT, ALKPHOS, BILITOT, PROT, ALBUMIN in the last 168 hours.  No results for input(s): LIPASE, AMYLASE in the last 168 hours.  No results for input(s): AMMONIA in the last 168 hours. Coagulation Profile: No results for input(s): INR, PROTIME in the last 168 hours. Cardiac Enzymes: No results for input(s): CKTOTAL, CKMB, CKMBINDEX, TROPONINI in the last 168 hours. BNP (last 3 results) No results for input(s): PROBNP in the last 8760 hours. HbA1C: No results for input(s): HGBA1C in the last 72 hours. CBG: No results for input(s): GLUCAP in the last 168 hours. Lipid Profile: No results for input(s): CHOL, HDL, LDLCALC, TRIG, CHOLHDL, LDLDIRECT in the last 72 hours. Thyroid Function Tests: No results for input(s): TSH, T4TOTAL, FREET4, T3FREE, THYROIDAB in the last 72 hours. Anemia Panel: No results for input(s): VITAMINB12, FOLATE, FERRITIN, TIBC, IRON, RETICCTPCT in the last 72 hours. Sepsis Labs: No results for input(s): PROCALCITON, LATICACIDVEN in the last 168 hours.   No results found for this or any previous visit (from the past 240 hour(s)).     Radiology Studies: No results found.   Scheduled Meds:  apixaban  5 mg Oral BID   atorvastatin  40 mg Oral QHS   baclofen  10 mg Oral TID   carvedilol  25 mg Oral BID WC   feeding supplement  237 mL Oral TID BM   gabapentin  600 mg Oral TID   lidocaine  1 patch Transdermal Q24H   multivitamin with minerals  1 tablet Oral Daily   pantoprazole  40 mg Oral QHS   polyethylene glycol  17 g Oral BID   senna-docusate  1 tablet Oral BID   valACYclovir  500 mg Oral Daily   Continuous Infusions:   LOS: 11 days      Jack Bi, MD [facility] If 7PM-7AM, please contact night-coverage 04/07/2021, 2:31 AM

## 2021-04-06 NOTE — Progress Notes (Signed)
PT Cancellation Note  Patient Details Name: Jack [name] MRN: [id number] DOB: 03/04/1933   Cancelled Treatment:    Reason Eval/Treat Not Completed: Fatigue/lethargy limiting ability to participate;Patient's level of consciousness. PT to re-attempt session. Pt remains lethargic and waxing/waning in and out of consciousness which spouse believes to be from medications. Unsafe to mobilize at this time. Will re-attempt tomorrow.   [place] Caster. Fairly IV, PT, DPT Physical Therapist- [facility]  04/06/2021, 3:38 PM

## 2021-04-06 NOTE — Progress Notes (Signed)
PT Cancellation Note  Patient Details Name: Jack [name] MRN: [id number] DOB: Jul 13, 1933   Cancelled Treatment:    Reason Eval/Treat Not Completed: Fatigue/lethargy limiting ability to participate;Patient's level of consciousness;Pain limiting ability to participate. Medical chart reviewed. Upon entry to room pt, pt very lethargic and drowsy barely arousable to touch and voice. Pt reports pain in back and immediately returns to sleep. PT holding treatment at this time due to level of consciousness and lethargy. Will re-attempt at later time as able.   [place] Caster. Fairly IV, PT, DPT Physical Therapist- [facility]  04/06/2021, 12:42 PM

## 2021-04-06 NOTE — TOC Progression Note (Addendum)
Transition of Care [facility]) - Progression Note    Patient Details  Name: Jack [name] MRN: [id number] Date of Birth: 1933/04/09  [facility] CM/SW Contact  Beverly Sessions, RN Phone Number: 04/06/2021, 4:11 PM  Clinical Narrative:     Per Earnest Bailey with CMA "[name] shows case complete with their physician agreeing with discharge.Patient's liability starts today at noon per voicemail left by [name]"  I spoke with Medical director Dr Amalia Hailey of [employer] advantage. Per Dr Amalia Hailey they are in agreement for insurance to cover 8 pallaitive radiation treatments inpatient  Per [facility] "Per Deloris at [facility], [place] will need to send them a written notice on letterheadthat they will continue to cover patient. Can be faxed to [phone]. "  Notified Dr Amalia Hailey of the above.  He is in agreement and ask that I reach back to [facility] [employer] to request letter.  Spoke to Tammy at [facility] advantage.  She states  that she has excalated it to her supervisor Crystal and they would call me back  Update TOC director.  Director has reviewed case with Physican Advisor and due to patient being lethargic to hold and he would call me back with an update   418 Pm - VM left for Tammy at [facility] to determine if there is an update 14 spoke with wife via phone and updated     Expected Discharge Plan: [place] Barriers to Discharge: Continued Medical Work up  Expected Discharge Plan and Services Expected Discharge Plan: [place]   Discharge Planning Services: CM Consult Post Acute Care Choice: Resumption of Svcs/PTA Provider, Home Health Living arrangements for the past 2 months: [place] ([place])                 DME Arranged: N/A DME Agency: NA       HH Arranged: RN, PT, OT [place] Agency: [place] ([place]) Date HH Agency Contacted: 03/27/21 Time [place]: [id number] Representative spoke with at [place]: [place] (SDOH) Interventions    Readmission Risk Interventions Readmission Risk Prevention Plan 03/27/2021  Transportation Screening Complete  Medication Review Press photographer) Complete  PCP or Specialist appointment within 3-5 days of discharge Complete  HRI or [place] Complete  SW Recovery Care/Counseling Consult Complete  [place] Patient Refused  Some recent data might be hidden

## 2021-04-07 ENCOUNTER — Ambulatory Visit: Payer: PPO

## 2021-04-07 DIAGNOSIS — J9601 Acute respiratory failure with hypoxia: Secondary | ICD-10-CM | POA: Diagnosis not present

## 2021-04-07 MED ORDER — OXYCODONE HCL ER 20 MG PO T12A
20.0000 mg | EXTENDED_RELEASE_TABLET | Freq: Two times a day (BID) | ORAL | Status: DC
  Filled 2021-04-07: qty 1

## 2021-04-07 MED ORDER — OXYCODONE-ACETAMINOPHEN 5-325 MG PO TABS: 1.0000 | ORAL_TABLET | Freq: Three times a day (TID) | ORAL | 0 refills | Status: AC | PRN

## 2021-04-07 MED ORDER — ADULT MULTIVITAMIN W/MINERALS CH: 1.0000 | ORAL_TABLET | Freq: Every day | ORAL | Status: AC

## 2021-04-07 MED ORDER — OXYCODONE HCL ER 10 MG PO T12A: 20.0000 mg | EXTENDED_RELEASE_TABLET | Freq: Two times a day (BID) | ORAL | 0 refills | Status: AC

## 2021-04-07 MED ORDER — GABAPENTIN 300 MG PO CAPS: 600.0000 mg | ORAL_CAPSULE | Freq: Three times a day (TID) | ORAL | 2 refills | Status: DC

## 2021-04-07 MED ORDER — POLYETHYLENE GLYCOL 3350 17 G PO PACK: 17.0000 g | PACK | Freq: Two times a day (BID) | ORAL | 0 refills | Status: AC

## 2021-04-07 MED ORDER — VALACYCLOVIR HCL 500 MG PO TABS: 500.0000 mg | ORAL_TABLET | Freq: Every day | ORAL | 0 refills | Status: DC

## 2021-04-07 MED ORDER — ENSURE ENLIVE PO LIQD: 237.0000 mL | Freq: Three times a day (TID) | ORAL | 12 refills | Status: DC

## 2021-04-07 MED ORDER — APIXABAN 5 MG PO TABS: 5.0000 mg | ORAL_TABLET | Freq: Two times a day (BID) | ORAL | 0 refills | Status: DC

## 2021-04-07 MED FILL — ELIQUIS 5 MG TABLET: 90 days supply | Qty: 180 | Fill #0

## 2021-04-07 MED FILL — OXYCODONE-ACETAMINOPHEN 5-325: 7 days supply | Qty: 20 | Fill #0

## 2021-04-07 MED FILL — VALACYCLOVIR HCL 500 MG TABLET: 30 days supply | Qty: 30 | Fill #0

## 2021-04-07 MED FILL — GABAPENTIN 300 MG CAPSULE: 30 days supply | Qty: 180 | Fill #0

## 2021-04-07 NOTE — Discharge Summary (Signed)
Physician Discharge Summary   Jack [name]  male DOB: [birth year]  BHA:[id number]  PCP: [name], MD  Admit date: 03/26/2021 Discharge date: 04/07/2021  Admitted From: home Disposition:  home Home Health: Yes  PT/OT/RN/aide/SW CODE STATUS: Full code  Discharge Instructions     No wound care   Complete by: As directed         Hospital Course:  For full details, please see H&P, progress notes, consult notes and ancillary notes.  Briefly,  Jack [name] is a 87 y.o. male with medical history significant for paroxysmal atrial fibrillation, coronary calcium and aortic atherosclerosis, diastolic dysfunction, GERD, ascending aortic dilatation, hypertension, CKD stage IIIb, BPH, multiple myeloma, prior tobacco use, presented to the emergency department from oncology clinic for right flank swelling and Neoplasm-related pain.   Severe back pain radiating to the right flank suspected due to pathology compression fracture T12--L4 Radiculopathy from multiple myeloma within thoracic lumbar spine --patient has history of compression fractures in the past underwent kyphoplasty x2 (per old record) --oncology following --started on high dose steroid and Valtrex --started on palliative radiation therapy --Dr Rudene Christians evaluated and no need for Kyphoplasty at this time. --Oncology palliative care NP discussed goals of care and managed pt's pain med regimen.  Oxycontin was increased to 20 mg BID (from home 10 mg BID) and then 20 mg TID because pt was complained of more pain on 7/22.  Pt was found to be too somnolent and refused to get up from bed, so dose reduced back to BID with instruction to hold if pt was too somnolent. --also had Percocet PRN, IV morphine PRN and IV toradol PRN ordered. --cont baclofen TID --cont gabapentin 600 mg TID --Pt was started on palliative radiation tx during hospitalization and will continue after discharge.   Stage III kappa chain myeloma --palliative  radiation tx per radiation oncology --cont follow up with medical oncology    constipation, resolved --resolved with aggressive Miralax --daily Miralax scheduled   Hypertension --BP varied widely --cont coreg   history of a fib --cont coreg --cont Eliquis   Nutrition Status: Nutrition Problem: Severe Malnutrition (in the context of chronic illness) Etiology: decreased appetite Signs/Symptoms: severe muscle depletion, severe fat depletion, percent weight loss (17% weight loss x 3 months) Percent weight loss: 17 % Interventions: Ensure Enlive (each supplement provides 350kcal and 20 grams of protein), MVI, Liberalize Diet    Discharge Diagnoses:  Principal Problem:   Acute hypoxemic respiratory failure (HCC) Active Problems:   Essential hypertension   GERD   Prostate hypertrophy   Hyperlipidemia   Constipation   Multiple myeloma not having achieved remission (HCC)   Right flank pain   Compression fracture of body of thoracic vertebra (HCC)   30 Day Unplanned Readmission Risk Score    Flowsheet Row ED to Hosp-Admission (Current) from 03/26/2021 in [place]  30 Day Unplanned Readmission Risk Score (%) 31.69 Filed at 04/07/2021 1200       This score is the patient's risk of an unplanned readmission within 30 days of being discharged (0 -100%). The score is based on dignosis, age, lab data, medications, orders, and past utilization.   Low:  0-14.9   Medium: 15-21.9   High: 22-29.9   Extreme: 30 and above         Discharge Instructions:  Allergies as of 04/07/2021       Reactions   Quinolones    Aortic dilation contraindicates FQ   Amlodipine  Other (See Comments)   LE edema   Azithromycin Nausea And Vomiting   Lisinopril Cough   Morphine And Related Nausea And Vomiting        Medication List     STOP taking these medications    acetaminophen 500 MG tablet Commonly known as: TYLENOL   bisacodyl 5 MG EC  tablet Commonly known as: DULCOLAX   dexamethasone 4 MG tablet Commonly known as: DECADRON   methocarbamol 500 MG tablet Commonly known as: ROBAXIN       TAKE these medications    apixaban 5 MG Tabs tablet Commonly known as: ELIQUIS Take 1 tablet (5 mg total) by mouth 2 (two) times daily. Increase dose from 2.5 mg. What changed:  medication strength how much to take additional instructions   atorvastatin 40 MG tablet Commonly known as: Lipitor Take 1 tablet (40 mg total) by mouth daily at 6 PM.   baclofen 10 MG tablet Commonly known as: LIORESAL Take 10 mg by mouth 3 (three) times daily.   calcitonin (salmon) 200 UNIT/ACT nasal spray Commonly known as: MIACALCIN/FORTICAL Place 1 spray into alternate nostrils daily.   carvedilol 25 MG tablet Commonly known as: COREG Take 1 tablet (25 mg total) by mouth 2 (two) times daily with a meal.   docusate sodium 100 MG capsule Commonly known as: COLACE Take 1 capsule (100 mg total) by mouth 2 (two) times daily.   feeding supplement Liqd Take 237 mLs by mouth 3 (three) times daily between meals.   gabapentin 300 MG capsule Commonly known as: NEURONTIN Take 2 capsules (600 mg total) by mouth 3 (three) times daily. Can give equivalent if cheaper. What changed:  medication strength how much to take additional instructions   lidocaine 5 % ointment Commonly known as: XYLOCAINE Apply topically 3 (three) times daily as needed for mild pain or moderate pain.   multivitamin with minerals Tabs tablet Take 1 tablet by mouth daily. Start taking on: April 08, 2021   naloxone 4 MG/0.1ML Liqd nasal spray kit Commonly known as: NARCAN SPRAY 1 SPRAY INTO ONE NOSTRIL AS DIRECTED FOR OPIOID OVERDOSE (TURN PERSON ON SIDE AFTER DOSE. IF NO RESPONSE IN 2-3 MINUTES OR PERSON RESPONDS BUT RELAPSES, REPEAT USING A NEW SPRAY DEVICE AND SPRAY INTO THE OTHER NOSTRIL. CALL 911 AFTER USE.) * EMERGENCY USE ONLY *   ondansetron 8 MG  tablet Commonly known as: ZOFRAN Take 1 tablet (8 mg total) by mouth every 8 (eight) hours as needed for nausea or vomiting.   oxyCODONE 10 mg 12 hr tablet Commonly known as: OXYCONTIN Take 2 tablets (20 mg total) by mouth every 12 (twelve) hours for 7 days. What changed: how much to take   oxyCODONE-acetaminophen 5-325 MG tablet Commonly known as: PERCOCET/ROXICET Take 1-2 tablets by mouth every 8 (eight) hours as needed for up to 7 days (breakthrough pain). What changed:  when to take this reasons to take this   polyethylene glycol 17 g packet Commonly known as: MIRALAX / GLYCOLAX Take 17 g by mouth 2 (two) times daily.   senna-docusate 8.6-50 MG tablet Commonly known as: Senokot-S Take 1 tablet by mouth 2 (two) times daily.   tamsulosin 0.4 MG Caps capsule Commonly known as: FLOMAX Take 0.4 mg by mouth daily.   traMADol 50 MG tablet Commonly known as: ULTRAM Take 50 mg by mouth every 6 (six) hours as needed.   valACYclovir 500 MG tablet Commonly known as: VALTREX Take 1 tablet (500 mg total) by mouth daily.  Start taking on: April 08, 2021         Follow-up Information     Lloyd Huger, MD Follow up.   Specialty: Oncology Contact information: [place] [address] [phone]                 Allergies  Allergen Reactions   Quinolones     Aortic dilation contraindicates FQ   Amlodipine Other (See Comments)    LE edema   Azithromycin Nausea And Vomiting   Lisinopril Cough   Morphine And Related Nausea And Vomiting     The results of significant diagnostics from this hospitalization (including imaging, microbiology, ancillary and laboratory) are listed below for reference.   Consultations:   Procedures/Studies: DG Lumbar Spine 2-3 Views  Result Date: 03/12/2021 CLINICAL DATA:  Provided history: Fracture. Additional history provided: L2-L3 kyphoplasty. Provided fluoroscopy time 2 minutes, 27 seconds. EXAM: LUMBAR  SPINE - 2-3 VIEW; DG C-ARM 1-60 MIN COMPARISON:  CT abdomen/pelvis 03/11/2021. FINDINGS: AP and lateral view intraprocedural fluoroscopic images of the lumbar spine are submitted, 2 images total. On the provided images, kyphoplasty material is present within three contiguous lumbar vertebral bodies (these are presumably the L1, L2 and L3 vertebral bodies given the provided history and findings on prior imaging. However, neither the thoracolumbar nor lumbosacral junction are included in the field of view to allow exact level determination). IMPRESSION: Two intraprocedural fluoroscopic images of the lumbar spine from reported L2 and L3 kyphoplasty, as described. Electronically Signed   By: Kellie Simmering DO   On: 03/12/2021 18:03   MR THORACIC SPINE W WO CONTRAST  Result Date: 03/29/2021 CLINICAL DATA:  Initial evaluation for hematologic malignancy, staging. Multiple myeloma. EXAM: MRI THORACIC WITHOUT AND WITH CONTRAST TECHNIQUE: Multiplanar and multiecho pulse sequences of the thoracic spine were obtained without and with intravenous contrast. CONTRAST:  27m GADAVIST GADOBUTROL 1 MMOL/ML IV SOLN COMPARISON:  MRI of the lumbar spine from 03/27/2021 as well as previous MRI from 01/29/2021. FINDINGS: Alignment: Physiologic with preservation of the normal thoracic kyphosis. Vertebrae: Abnormal heterogeneous signal intensity seen throughout the visualized bone marrow, presumably related to history of multiple myeloma. There is new abnormal STIR signal intensity involving the T10 and T11 vertebral bodies, new as compared to prior thoracic spine MRI from 01/29/2021, suggesting progressive disease. There is an associated pathologic compression fracture at the inferior endplate of TI32with no more than mild central height loss and trace 2 mm bony retropulsion, new from prior. The marrow edema at this level is likely at least in part related to the fracture. 1 cm STIR hyperintense lesion involving the T8 vertebral body is  stable. Additional subcentimeter STIR hyperintense lesions involving the T6 and T7 vertebral bodies also similar. T9 hemangioma noted. Additional compression deformities involving the T12 through L3 vertebral bodies with sequelae of prior vertebral augmentation again noted. Mild chronic compression deformities at the superior endplates of T3 and T4 are stable. Vertebral body height otherwise maintained. Cord: Normal signal and morphology. Previously identified soft tissue density within the ventral epidural space extending from L1 through L2-3 again noted, better characterized on prior MRI of the lumbar spine. No other extra osseous extension of tumor seen within the thoracic spine. Paraspinal and other soft tissues: Unremarkable. Disc levels: Mild for age multilevel disc desiccation seen throughout the thoracic spine. No significant disc bulge or focal disc herniation. No spinal stenosis. Foramina remain patent. IMPRESSION: 1. Abnormal heterogeneous signal intensity throughout the visualized bone marrow, consistent  with history of multiple myeloma. Increased STIR signal intensity within the T10 and T11 vertebral bodies since prior thoracic spine MRI from 01/29/2021, suggesting progressive disease. 2. New pathologic compression fracture at the inferior endplate of I33 with no more than mild central height loss and trace 2 mm bony retropulsion. No stenosis. 3. Small amount of extra osseous tumor within the ventral epidural space at L1 through L2-3, better characterized on prior lumbar spine MRI. No other epidural or extraosseous tumor within the thoracic spine. 4. No significant canal or foraminal stenosis. Electronically Signed   By: Jeannine Boga M.D.   On: 03/29/2021 04:31   MR Lumbar Spine W Wo Contrast  Result Date: 03/27/2021 CLINICAL DATA:  Low back pain, history of multiple myeloma EXAM: MRI LUMBAR SPINE WITHOUT AND WITH CONTRAST TECHNIQUE: Multiplanar and multiecho pulse sequences of the lumbar  spine were obtained without and with intravenous contrast. CONTRAST:  7.10m GADAVIST GADOBUTROL 1 MMOL/ML IV SOLN COMPARISON:  01/29/2021 FINDINGS: Segmentation:  Standard. Alignment:  No significant listhesis. Vertebrae: There are pathologic compression fractures at T12-L4. Progressed since the prior MRI but present on other interval imaging with exception of L4. Cement augmentation is present at T12-L3. Minor endplate retropulsion, greatest at T12 and L1. There is mildly heterogeneous marrow signal with patchy STIR hyperintensity and enhancement. New mild ventral epidural soft tissue extension is present at L1 to L2-L3. Conus medullaris and cauda equina: Conus extends to the L1-L2 level. Conus and cauda equina appear normal. No abnormal intrathecal enhancement. Paraspinal and other soft tissues: Unremarkable. Disc levels: L1-L2:  No significant canal or foraminal stenosis. L2-L3: Disc bulge. Facet arthropathy with ligamentum flavum infolding. No significant canal or foraminal stenosis. L3-L4: Disc bulge. Facet arthropathy with ligamentum flavum infolding. No significant canal or foraminal stenosis. L4-L5: Disc bulge. Facet arthropathy with ligamentum flavum infolding. No significant canal or foraminal stenosis. L5-S1:  No significant canal or foraminal stenosis. IMPRESSION: Pathologic compression fractures of T12-L4 with cement augmentation at T12-L3. Heterogeneous marrow signal a and enhancement likely reflecting known myeloma. There is new mild ventral epidural soft tissue extension at L1 to L2-L3. No significant canal or foraminal narrowing at any level. Electronically Signed   By: PMacy MisM.D.   On: 03/27/2021 14:39   CT ABDOMEN PELVIS W CONTRAST  Result Date: 03/26/2021 CLINICAL DATA:  Abdominal distension and abdominal pain EXAM: CT ABDOMEN AND PELVIS WITH CONTRAST TECHNIQUE: Multidetector CT imaging of the abdomen and pelvis was performed using the standard protocol following bolus administration  of intravenous contrast. CONTRAST:  1020mOMNIPAQUE IOHEXOL 350 MG/ML SOLN COMPARISON:  03/11/2021 FINDINGS: Lower chest: Unremarkable. Hepatobiliary: No suspicious focal abnormality within the liver parenchyma. There is no evidence for gallstones, gallbladder wall thickening, or pericholecystic fluid. No intrahepatic or extrahepatic biliary dilation. Pancreas: No focal mass lesion. No dilatation of the main duct. No intraparenchymal cyst. No peripancreatic edema. Spleen: No splenomegaly. No focal mass lesion. Adrenals/Urinary Tract: No adrenal nodule or mass. Kidneys unremarkable. No evidence for hydroureter. The urinary bladder appears normal for the degree of distention. Stomach/Bowel: Tiny hiatal hernia. Stomach otherwise unremarkable. Duodenum is normally positioned as is the ligament of Treitz. No small bowel wall thickening. No small bowel dilatation. The terminal ileum is normal. The appendix is normal. No gross colonic mass. No colonic wall thickening. No substantial diverticular disease in the left colon. Colon is diffusely fluid-filled with fluid visible in the rectum, imaging findings that could be consistent diarrhea. No colonic wall thickening. Vascular/Lymphatic: There is moderate atherosclerotic calcification  of the abdominal aorta without aneurysm. There is no gastrohepatic or hepatoduodenal ligament lymphadenopathy. No retroperitoneal or mesenteric lymphadenopathy. No pelvic sidewall lymphadenopathy. Reproductive: Prostate gland is markedly enlarged. Other: No intraperitoneal free fluid. Musculoskeletal: Mesh in the left groin region is compatible with prior herniorrhaphy. Bony mineralization is diffusely heterogeneous in this patient with a history of multiple myeloma. Patient is status post multiple level vertebral augmentation in the lower thoracic and lumbar spine. IMPRESSION: 1. No acute findings in the abdomen or pelvis. 2. Fluid in the right and transverse colon with visible fluid in the  rectum. Imaging findings could be consistent with diarrhea. No evidence for colonic wall thickening to suggest colitis. 3. Marked prostatomegaly. 4. Tiny hiatal hernia. 5. Aortic Atherosclerosis (ICD10-I70.0). Electronically Signed   By: Misty Stanley M.D.   On: 03/26/2021 18:09   CT ABDOMEN PELVIS W CONTRAST  Result Date: 03/11/2021 CLINICAL DATA:  87 year old male with abdominal pain. Recent diagnosis of MGUS, concern for multiple myeloma. EXAM: CT ABDOMEN AND PELVIS WITH CONTRAST TECHNIQUE: Multidetector CT imaging of the abdomen and pelvis was performed using the standard protocol following bolus administration of intravenous contrast. CONTRAST:  132m OMNIPAQUE IOHEXOL 300 MG/ML  SOLN COMPARISON:  Noncontrast CT Abdomen and Pelvis 01/29/2021 and earlier. Thoracic and lumbar MRI 01/29/2021. FINDINGS: Lower chest: Stable small to moderate hiatal hernia. Tortuous thoracic aorta. Calcified coronary artery atherosclerosis. No cardiomegaly or pericardial effusion. No pleural effusion. Stable mild lung base atelectasis or scarring. Hepatobiliary: Gallbladder is at the upper limits of normal but does not appear inflamed. Negative liver. No bile duct enlargement. Pancreas: Negative. Spleen: Negative. Adrenals/Urinary Tract: Normal adrenal glands. Nonobstructed kidneys with symmetric renal enhancement and contrast excretion. There is probable small benign left renal parapelvic cyst accounting for the increased left renal hilum soft tissue on series 7, image 26, stable since 2018. Decompressed ureters. Decompressed and negative bladder. Stomach/Bowel: Negative large bowel aside from retained stool in the right colon. Diminutive, negative appendix on coronal image 48. Flocculated material in the terminal ileum but no dilated small bowel. Aside from hiatal hernia the stomach is negative. Negative duodenum. No free air or free fluid. Vascular/Lymphatic: Aortoiliac calcified atherosclerosis. Major arterial structures  remain patent in the abdomen and pelvis. Tortuous iliac arteries. No lymphadenopathy. Reproductive: Prostatomegaly. Previous left inguinal hernia repair with mesh appears stable. Otherwise negative. Other: No pelvic free fluid. Musculoskeletal: Diffuse osteopenia and heterogeneous bone mineralization similar to the CT last month, distinctly different from a 2018 CT. Interval augmentation of the T12 and L1 compression fractures. New/progressed compression fractures of L2 and L3 since last month. No significant retropulsion of bone or other complicating features. Bone mineralization is especially heterogeneous in the lumbar spine and pelvis, and these are likely pathologic fractures in the setting of MGUS/myeloma. No other acute osseous abnormality identified. IMPRESSION: 1. Diffusely abnormal bone mineralization in keeping with MGUS/Multiple Myeloma with new or progressed pathologic compression fractures of L2 and L3 since last month. Interval augmentation of T12 and L1. 2. No other acute finding in the abdomen or pelvis. Small to moderate hiatal hernia. Prostatomegaly. Aortic Atherosclerosis (ICD10-I70.0). Electronically Signed   By: HGenevie AnnM.D.   On: 03/11/2021 09:53   NM PET Image Initial (PI) Whole Body  Result Date: 03/24/2021 CLINICAL DATA:  Initial treatment strategy for concern for multiple myeloma. Gammopathy with multiple M spikes. Prior kyphoplasty 12/16/2020 at L2-3. EXAM: NUCLEAR MEDICINE PET WHOLE BODY TECHNIQUE: 9.8 mCi F-18 FDG was injected intravenously. Full-ring PET imaging was performed from the head  to foot after the radiotracer. CT data was obtained and used for attenuation correction and anatomic localization. Fasting blood glucose: 91 mg/dl COMPARISON:  03/11/2021 abdominopelvic CT.  Chest CT 11/21/2017. FINDINGS: Mediastinal blood pool activity: SUV max 3.1 HEAD/NECK: No areas of abnormal hypermetabolism. Incidental CT findings: Cerebral atrophy is within normal variation for age. No  cervical adenopathy. Left carotid atherosclerotic calcifications. CHEST: No thoracic nodal hypermetabolism. Distal esophageal hypermetabolism in the setting of a small hiatal hernia. This measures a S.U.V. max of 4.6, including on approximately 139/3. No pulmonary parenchymal hypermetabolism. Incidental CT findings: Aortic and coronary artery calcification. Ascending aortic dilatation again identified, including up to 4.6 cm. Mild cardiomegaly. Calcified left-sided pleural plaques. Centrilobular emphysema. No thoracic adenopathy. ABDOMEN/PELVIS: No abdominopelvic parenchymal or nodal hypermetabolism. Incidental CT findings: Normal adrenal glands. Proximal gastric underdistention. No abdominopelvic adenopathy. Moderate prostatomegaly. SKELETON: Multifocal thoracolumbar spine hypermetabolism. This is seen at the previously augmented levels and likely reactive. However, there is also hypermetabolism within the T11 vertebral body at a S.U.V. max of 5.1. Incidental CT findings: Diffuse heterogeneous osseous density, most consistent with myeloma involvement. EXTREMITIES: No areas of abnormal hypermetabolism. Incidental CT findings: Loose bodies about the right knee are likely within a popliteal cyst. Subtle cortical irregularity which is most apparent in the femoral, less so humeral shafts, suspicious for myeloma involvement. IMPRESSION: 1. Heterogeneity throughout the marrow space, most consistent with the clinical history of multiple myeloma. The only suspicious area of osseous hypermetabolism is within the T11 vertebral body. 2. No findings of extra-osseous hypermetabolic myeloma. 3. Distal esophageal hypermetabolism, suspicious for esophagitis. Concurrent small hiatal hernia. 4. Incidental findings, including: Similar ascending aortic aneurysm. Aortic atherosclerosis (ICD10-I70.0), coronary artery atherosclerosis and emphysema (ICD10-J43.9). Left-sided pleural partially calcified pleural plaques may relate to prior  empyema or hemothorax. Prostatomegaly. Electronically Signed   By: Abigail Miyamoto M.D.   On: 03/24/2021 16:05   DG Chest Portable 1 View  Result Date: 03/26/2021 CLINICAL DATA:  87 year old male with right flank pain and rib fracture. EXAM: PORTABLE CHEST 1 VIEW COMPARISON:  Chest radiograph dated 02/02/2021. FINDINGS: No focal consolidation, pleural effusion, or pneumothorax. Apparent two small adjacent calcified nodules in the left upper lobe with combined dimension of 9 mm. Attention on follow-up imaging recommended. The cardiac silhouette is within limits. No acute osseous pathology. IMPRESSION: 1. No acute cardiopulmonary process. 2. A 9 mm left upper lobe calcified nodule. Electronically Signed   By: Anner Crete M.D.   On: 03/26/2021 19:08   DG C-Arm 1-60 Min  Result Date: 03/12/2021 CLINICAL DATA:  Provided history: Fracture. Additional history provided: L2-L3 kyphoplasty. Provided fluoroscopy time 2 minutes, 27 seconds. EXAM: LUMBAR SPINE - 2-3 VIEW; DG C-ARM 1-60 MIN COMPARISON:  CT abdomen/pelvis 03/11/2021. FINDINGS: AP and lateral view intraprocedural fluoroscopic images of the lumbar spine are submitted, 2 images total. On the provided images, kyphoplasty material is present within three contiguous lumbar vertebral bodies (these are presumably the L1, L2 and L3 vertebral bodies given the provided history and findings on prior imaging. However, neither the thoracolumbar nor lumbosacral junction are included in the field of view to allow exact level determination). IMPRESSION: Two intraprocedural fluoroscopic images of the lumbar spine from reported L2 and L3 kyphoplasty, as described. Electronically Signed   By: Kellie Simmering DO   On: 03/12/2021 18:03      Labs: BNP (last 3 results) Recent Labs    03/11/21 0829  BNP 58.5   Basic Metabolic Panel: Recent Labs  Lab 04/01/21 0506 04/02/21 2778  04/03/21 0552  NA 139 137 138  K 4.4 4.1 4.7  CL 103 101 104  CO2 '27 25 28  ' GLUCOSE  136* 117* 138*  BUN 47* 43* 45*  CREATININE 1.06 1.02 0.94  CALCIUM 9.3 9.0 9.2  MG 2.2 2.2 2.3   Liver Function Tests: No results for input(s): AST, ALT, ALKPHOS, BILITOT, PROT, ALBUMIN in the last 168 hours. No results for input(s): LIPASE, AMYLASE in the last 168 hours. No results for input(s): AMMONIA in the last 168 hours. CBC: Recent Labs  Lab 04/01/21 0506 04/02/21 0648 04/03/21 0552  WBC 7.5 7.3 6.7  HGB 11.0* 11.2* 11.0*  HCT 32.9* 33.7* 33.7*  MCV 94.5 96.8 96.6  PLT 170 139* 122*   Cardiac Enzymes: No results for input(s): CKTOTAL, CKMB, CKMBINDEX, TROPONINI in the last 168 hours. BNP: Invalid input(s): POCBNP CBG: No results for input(s): GLUCAP in the last 168 hours. D-Dimer No results for input(s): DDIMER in the last 72 hours. Hgb A1c No results for input(s): HGBA1C in the last 72 hours. Lipid Profile No results for input(s): CHOL, HDL, LDLCALC, TRIG, CHOLHDL, LDLDIRECT in the last 72 hours. Thyroid function studies No results for input(s): TSH, T4TOTAL, T3FREE, THYROIDAB in the last 72 hours.  Invalid input(s): FREET3 Anemia work up No results for input(s): VITAMINB12, FOLATE, FERRITIN, TIBC, IRON, RETICCTPCT in the last 72 hours. Urinalysis    Component Value Date/Time   COLORURINE STRAW (A) 03/26/2021 1624   APPEARANCEUR HAZY (A) 03/26/2021 1624   APPEARANCEUR Clear 12/25/2020 1037   LABSPEC 1.025 03/26/2021 1624   PHURINE 9.0 (H) 03/26/2021 1624   GLUCOSEU NEGATIVE 03/26/2021 1624   HGBUR NEGATIVE 03/26/2021 1624   BILIRUBINUR NEGATIVE 03/26/2021 1624   BILIRUBINUR Negative 12/25/2020 1037   KETONESUR NEGATIVE 03/26/2021 1624   PROTEINUR NEGATIVE 03/26/2021 1624   NITRITE NEGATIVE 03/26/2021 1624   LEUKOCYTESUR NEGATIVE 03/26/2021 1624   Sepsis Labs Invalid input(s): PROCALCITONIN,  WBC,  LACTICIDVEN Microbiology No results found for this or any previous visit (from the past 240 hour(s)).   Total time spend on discharging this patient,  including the last patient exam, discussing the hospital stay, instructions for ongoing care as it relates to all pertinent caregivers, as well as preparing the medical discharge records, prescriptions, and/or referrals as applicable, is 277 minutes.    Enzo Bi, MD  [facility] 04/07/2021, 2:51 PM

## 2021-04-07 NOTE — Progress Notes (Signed)
Physical Therapy Treatment Patient Details Name: Jack [name] MRN: [id number] DOB: [birth year] Today's Date: 04/07/2021    History of Present Illness Pt admitted 03/26/21 for multiple myloma and uncontrolled pain; pt starte chemotherapy on this date in the hospital. Pt with recent hospitalization 2 weeks ago for L2-L3 kyphoplasty and wears TLSO for OOB mobility. PMH includes:  gastric reflux, AFIB, hypertension, hyperlipidemia, recent T12/L1 kyphoplasty, and multiple myeloma.    PT Comments     Pt was supine in bed with HOB elevated ~ 30 degrees upon arriving. Spouse and CM in room discussing plan going forward. Pt has been severely pain limited over past few days however was A and O x 4 and willing to participate at this time. He performed log roll L to short sit without physical assistance. Sat EOB x several minutes prior to applying TLSO. Session progressed to standing and ambulating 120 ft with CGA for safety. Author did issued gait belt for home use. Pt will continue to benefit from skilled PT going forward. Recommend HHPT to follow to improve strength and activity tolerance.    Follow Up Recommendations  Home health PT;Supervision/Assistance - 24 hour     Equipment Recommendations  None recommended by PT / family requesting information on hospital bed. They have [facility] to [facility] if needed however pt demonstrated safe mobility to point he would not need w/c. Currently pt sleep in lift chair at home due to LBP.       Precautions / Restrictions Precautions Precautions: Fall Required Braces or Orthoses: Spinal Brace Spinal Brace: Thoracolumbosacral orthotic Restrictions Weight Bearing Restrictions: No    Mobility  Bed Mobility Overal bed mobility: Needs Assistance Bed Mobility: Rolling;Sidelying to Sit Rolling: Supervision Sidelying to sit: Supervision Supine to sit: Supervision Sit to supine: Min assist   General bed mobility comments: Pt sleep in lift chair at home currently  .    Transfers Overall transfer level: Needs assistance Equipment used: Rolling walker (2 wheeled) Transfers: Sit to/from Stand Sit to Stand: Min guard         General transfer comment: CGA for safety only. no lifting assistance. pt does have lift chair at home if he were to need assistance to stand. spouse also reports she has access to w/c. pt does have 6 more days of treatment going forward.  Ambulation/Gait Ambulation/Gait assistance: Min guard Gait Distance (Feet): 120 Feet Assistive device: Rolling walker (2 wheeled) Gait Pattern/deviations: Step-through pattern Gait velocity: WNL   General Gait Details: Pt was able to ambulate 120 ft with slow steady gait kinematics. no LOB however distance limited by pain. Overall pt tolerate ambulation well.   Stairs  Pt does not have stairs at home.    Balance Overall balance assessment: Needs assistance Sitting-balance support: Feet supported;No upper extremity supported Sitting balance-Leahy Scale: Good Sitting balance - Comments: sat EOB with supervision. did require assistance to correctly place TLSO. (TLSO does have missing strap)   Standing balance support: Bilateral upper extremity supported;During functional activity Standing balance-Leahy Scale: Fair Standing balance comment: reliant on RW during standing 2/2 to pain      Cognition Arousal/Alertness: Awake/alert Behavior During Therapy: WFL for tasks assessed/performed Overall Cognitive Status: Within Functional Limits for tasks assessed    General Comments: Pt was A and O x 4. able to give accurate history of PLOF and situation. LBP most limiting however pt was able to perform all desired task with little assist             Pertinent  Vitals/Pain Pain Assessment: Faces Faces Pain Scale: Hurts even more Pain Location: back Pain Descriptors / Indicators: Discomfort;Pressure Pain Intervention(s): Limited activity within patient's tolerance;Monitored during  session;Premedicated before session;Repositioned     PT Goals (current goals can now be found in the care plan section) Acute Rehab PT Goals Patient Stated Goal: to go home Progress towards PT goals: Progressing toward goals    Frequency    Min 2X/week      PT Plan Current plan remains appropriate       AM-PAC PT "6 Clicks" Mobility   Outcome Measure  Help needed turning from your back to your side while in a flat bed without using bedrails?: A Little Help needed moving from lying on your back to sitting on the side of a flat bed without using bedrails?: A Little Help needed moving to and from a bed to a chair (including a wheelchair)?: A Little Help needed standing up from a chair using your arms (e.g., wheelchair or bedside chair)?: A Little Help needed to walk in hospital room?: A Little Help needed climbing 3-5 steps with a railing? : A Little 6 Click Score: 18    End of Session Equipment Utilized During Treatment: Gait belt Activity Tolerance: Patient tolerated treatment well Patient left: in bed;with call bell/phone within reach;with family/visitor present Nurse Communication: Mobility status PT Visit Diagnosis: Other abnormalities of gait and mobility (R26.89);Muscle weakness (generalized) (M62.81);Difficulty in walking, not elsewhere classified (R26.2);Pain;Unsteadiness on feet (R26.81)     Time: 4098-1191 PT Time Calculation (min) (ACUTE ONLY): 23 min  Charges:  $Gait Training: 8-22 mins $Therapeutic Activity: 8-22 mins                    Julaine Fusi PTA 04/07/21, 2:15 PM

## 2021-04-07 NOTE — TOC Transition Note (Signed)
Transition of Care [facility]) - Progression Note    Patient Details  Name: Jack [name] MRN: [id number] Date of Birth: 16-Feb-1933  Transition of Care [facility]) CM/SW Contact  Beverly Sessions, RN Phone Number: 04/07/2021, 3:02 PM  Clinical Narrative:      Patient to discharge home today Patient was able to ambulate 120 with PT Wife at bedside [facility] with [place] notified of discharge.  TOC director and Physician Advisor aware  Order for hospital bed.  Per wife she would like to get patient home and settled prior to deciding if they want the bed for sure or not.  Referral made to [facility] with [employer].  [employer] will follow up, and wife also has contact information   Wife has decided that she will transport patient home today.  If that goes well she will take him to and from outpatient radiation.  If she is unable to transport him she has the contact information for [facility] transport and will call tonight if transportation is needed tomorrow.  I have confirmed with [facility] safe transport this is something the can accommodate.    Expected Discharge Plan: [place] Barriers to Discharge: No Barriers Identified  Expected Discharge Plan and Services Expected Discharge Plan: [place]   Discharge Planning Services: CM Consult Post Acute Care Choice: Resumption of Svcs/PTA Provider, Home Health Living arrangements for the past 2 months: Single Family Home (Condo) Expected Discharge Date: 04/07/21               DME Arranged: N/A DME Agency: NA       HH Arranged: PT, OT, Nurse's Aide, Social Work [employer] Agency: Microbiologist ([place]) Date [place]: 04/07/21 Time [place]: 1258 Representative spoke with at [place]: [place] ([place]) Interventions    Readmission Risk Interventions Readmission Risk Prevention Plan 03/27/2021  Transportation Screening Complete  Medication Review Human resources officer) Complete  PCP or Specialist appointment within 3-5 days of discharge Complete  HRI or [place] Complete  SW Recovery Care/Counseling Consult Complete  [place] Patient Refused  Some recent data might be hidden

## 2021-04-07 NOTE — Progress Notes (Signed)
Patient's wife given instructions to keep all follow up appointments, when to return for worsening symptoms, IV discontinued with some bleeding 2x2 changed and pressure applied, medication education ie OxyContin, Percocet, & Tramadol, & discharged via wheelchair.

## 2021-04-07 NOTE — Progress Notes (Signed)
Mobility Specialist - Progress Note   04/07/21 1400  Mobility  Activity Refused mobility  Mobility performed by Mobility specialist    Pt politely declined mobility this date, no reason specified. Pt just finished ambulating with PT and would like to rest at this time. Will attempt session another date.   Kathee Delton Mobility Specialist 04/07/21, 2:54 PM

## 2021-04-07 NOTE — Care Management (Signed)
    Durable Medical Equipment  (From admission, onward)           Start     Ordered   04/07/21 1512  For home use only DME Hospital bed  Once       Question Answer Comment  Length of Need Lifetime   Patient has (list medical condition): Chronic pain   The above medical condition requires: Patient requires the ability to reposition frequently   Head must be elevated greater than: 45 degrees   Bed type Semi-electric      04/07/21 1512

## 2021-04-07 NOTE — Progress Notes (Signed)
PT Cancellation Note  Patient Details Name: Jack [name] MRN: [id number] DOB: Jul 02, 1933   Cancelled Treatment:    Reason Eval/Treat Not Completed: Patient declined, no reason specified. Pt declining PT at this time. PT requesting to re-attempt in afternoon. PT will re-attempt as schedule permits.   [place] Caster. Fairly IV, PT, DPT Physical Therapist- [facility]  04/07/2021, 9:58 AM

## 2021-04-08 ENCOUNTER — Ambulatory Visit: Payer: PPO

## 2021-04-08 ENCOUNTER — Ambulatory Visit: Admission: RE | Admit: 2021-04-08 | Payer: PPO | Source: Ambulatory Visit

## 2021-04-09 ENCOUNTER — Ambulatory Visit
Admission: RE | Admit: 2021-04-09 | Discharge: 2021-04-09 | Disposition: A | Discharge status: Home / Self Care | Payer: PPO | Source: Ambulatory Visit | Attending: Radiation Oncology | Admitting: Radiation Oncology

## 2021-04-09 ENCOUNTER — Telehealth: Payer: Self-pay | Admitting: *Deleted

## 2021-04-09 ENCOUNTER — Inpatient Hospital Stay (HOSPITAL_BASED_OUTPATIENT_CLINIC_OR_DEPARTMENT_OTHER): Payer: PPO | Admitting: Hospice and Palliative Medicine

## 2021-04-09 ENCOUNTER — Inpatient Hospital Stay: Payer: PPO | Admitting: Oncology

## 2021-04-09 DIAGNOSIS — G893 Neoplasm related pain (acute) (chronic): Secondary | ICD-10-CM

## 2021-04-09 DIAGNOSIS — Z510 Encounter for antineoplastic radiation therapy: Secondary | ICD-10-CM | POA: Insufficient documentation

## 2021-04-09 DIAGNOSIS — Z515 Encounter for palliative care: Secondary | ICD-10-CM

## 2021-04-09 DIAGNOSIS — J9601 Acute respiratory failure with hypoxia: Secondary | ICD-10-CM | POA: Diagnosis not present

## 2021-04-09 DIAGNOSIS — C9000 Multiple myeloma not having achieved remission: Secondary | ICD-10-CM | POA: Insufficient documentation

## 2021-04-09 DIAGNOSIS — M8440XA Pathological fracture, unspecified site, initial encounter for fracture: Secondary | ICD-10-CM | POA: Diagnosis not present

## 2021-04-09 DIAGNOSIS — S22000A Wedge compression fracture of unspecified thoracic vertebra, initial encounter for closed fracture: Secondary | ICD-10-CM | POA: Diagnosis not present

## 2021-04-09 MED ORDER — BACLOFEN 10 MG PO TABS: 10.0000 mg | ORAL_TABLET | Freq: Three times a day (TID) | ORAL | 2 refills | Status: DC

## 2021-04-09 MED FILL — BACLOFEN 10 MG TABLET: 10 days supply | Qty: 30 | Fill #0

## 2021-04-09 NOTE — Telephone Encounter (Signed)
[place], Physical Therapist with [place] called asking for order approved for Patient and [place] 2 wk 2, 1 wk 1

## 2021-04-09 NOTE — Telephone Encounter (Signed)
[facility] ORDER called and left on [place]'s vm

## 2021-04-09 NOTE — Progress Notes (Signed)
Virtual Visit via Video Note  I connected with Jack [name] on 04/09/21 at 10:30 AM EDT by a video enabled telemedicine application and verified that I am speaking with the correct person using two identifiers.  Location: Patient: Home Provider: Clinic   I discussed the limitations of evaluation and management by telemedicine and the availability of in person appointments. The patient expressed understanding and agreed to proceed.  History of Present Illness: Jack [name] is a 87 y.o. male with multiple medical problems including hypertension, CKD stage IIIb, A. fib on Eliquis, and anemia.  Patient was hospitalized 01/02/2021 to 01/07/2021 with T12 compression fracture with recommendation for conservative management including use of a TLSO brace.  Patient was hospitalized again 01/29/2021 to 02/07/2021 with recurrent back pain and MR showing T12 and new L1 compression fracture.  Patient underwent T12/L1 kyphoplasty on 02/02/2021.  There was also suspicion of a left fifth rib fracture, which occurred while transitioning from the stretcher for his kyphoplasty.  Patient had mild hypercalcemia with SPEP and UPEP concerning for possible myeloma.  Patient was referred to [facility] for work-up.  He was readmitted 03/11/2021 - 03/17/2021 with worsening back pain.  CT revealed new compression fractures of L2 and L3.  Patient underwent kyphoplasty on 6/30.  He was hospitalized again 03/26/2021-04/07/2021 with severe back/flank pain.  MRI revealed T10 pathologic compression fracture.  He was started on treatment for multiple myeloma with dexamethasone/Velcade and XRT to spine.  Palliative care was consulted to help address goals and manage ongoing symptoms  Observations/Objective: Post hospital follow-up.  I spoke with patient and wife.  Patient is reportedly more comfortable since returning home.  However, ambulation and movement is still associated with significant back pain.  Both wife and patient are  comfortable with current medication regimen and were not interested in making changes today.  They do have adequate supply of opioid medications since discharging from the hospital.  Wife did request refill of baclofen.  Note that patient is still drowsy at times, which is likely secondary to additive effects from multiple sedating medications.  Hopefully, patient's pain will improve with continued XRT, which will allow [name] to slowly titrate down doses of pain medications.  Assessment and Plan: Multiple myeloma -on treatment with Velcade/dexamethasone and XRT.  Followed by Dr. Grayland Ormond who patient will see next week for next treatment dose.  Neoplasm related pain -continue OxyContin/Percocet/gabapentin.  Refill baclofen per patient request.  Case and plan discussed with Dr. Grayland Ormond  Follow Up Instructions: Follow-up MyChart visit 2 weeks   I discussed the assessment and treatment plan with the patient. The patient was provided an opportunity to ask questions and all were answered. The patient agreed with the plan and demonstrated an understanding of the instructions.   The patient was advised to call back or seek an in-person evaluation if the symptoms worsen or if the condition fails to improve as anticipated.  I provided 15 minutes of non-face-to-face time during this encounter.   Irean Hong, NP

## 2021-04-10 ENCOUNTER — Ambulatory Visit
Admission: RE | Admit: 2021-04-10 | Discharge: 2021-04-10 | Disposition: A | Discharge status: Home / Self Care | Payer: PPO | Source: Ambulatory Visit | Attending: Radiation Oncology | Admitting: Radiation Oncology

## 2021-04-10 DIAGNOSIS — Z510 Encounter for antineoplastic radiation therapy: Secondary | ICD-10-CM | POA: Diagnosis not present

## 2021-04-11 ENCOUNTER — Other Ambulatory Visit: Payer: Self-pay | Admitting: Urology

## 2021-04-13 ENCOUNTER — Encounter: Payer: Self-pay | Admitting: Oncology

## 2021-04-13 ENCOUNTER — Ambulatory Visit: Payer: PPO

## 2021-04-13 ENCOUNTER — Ambulatory Visit: Admission: RE | Admit: 2021-04-13 | Payer: PPO | Source: Ambulatory Visit

## 2021-04-13 ENCOUNTER — Encounter: Payer: Self-pay | Admitting: Hospice and Palliative Medicine

## 2021-04-13 ENCOUNTER — Other Ambulatory Visit: Payer: Self-pay | Admitting: *Deleted

## 2021-04-13 DIAGNOSIS — R972 Elevated prostate specific antigen [PSA]: Secondary | ICD-10-CM

## 2021-04-13 MED FILL — TAMSULOSIN HCL 0.4 MG CAPSULE: 43 days supply | Qty: 43 | Fill #0

## 2021-04-13 NOTE — Addendum Note (Signed)
Addended by: Despina Hidden on: 04/13/2021 08:34 AM   Modules accepted: Orders

## 2021-04-14 ENCOUNTER — Ambulatory Visit: Payer: PPO

## 2021-04-14 ENCOUNTER — Encounter: Payer: Self-pay | Admitting: Oncology

## 2021-04-14 DIAGNOSIS — S22080S Wedge compression fracture of T11-T12 vertebra, sequela: Secondary | ICD-10-CM | POA: Diagnosis not present

## 2021-04-14 DIAGNOSIS — S32000S Wedge compression fracture of unspecified lumbar vertebra, sequela: Secondary | ICD-10-CM | POA: Diagnosis not present

## 2021-04-14 DIAGNOSIS — C9000 Multiple myeloma not having achieved remission: Secondary | ICD-10-CM | POA: Diagnosis not present

## 2021-04-14 NOTE — Telephone Encounter (Signed)
I have spoken to Jack [name].   They want to know if Dr. Grayland Ormond feels patient could benefit from receiving Velcade without XRT, stopping XRT due to unbearable pain.  Both patient and the wife feel that he would not be able to come in for lab/MD/Velcade because that will be to long of an appt.  It was mentioned by PCP that palliative care come to their home for evaluation but not Hospice [facility] because they are unsure if they are going to stop treatments.

## 2021-04-15 ENCOUNTER — Encounter: Payer: Self-pay | Admitting: Oncology

## 2021-04-15 ENCOUNTER — Ambulatory Visit: Payer: PPO

## 2021-04-16 ENCOUNTER — Encounter: Payer: Self-pay | Admitting: Oncology

## 2021-04-16 ENCOUNTER — Telehealth: Payer: Self-pay | Admitting: Pharmacist

## 2021-04-16 ENCOUNTER — Inpatient Hospital Stay: Payer: PPO

## 2021-04-16 ENCOUNTER — Ambulatory Visit: Payer: PPO

## 2021-04-16 ENCOUNTER — Telehealth: Payer: Self-pay | Admitting: Pharmacy Technician

## 2021-04-16 ENCOUNTER — Encounter: Payer: Self-pay | Admitting: Internal Medicine

## 2021-04-16 ENCOUNTER — Inpatient Hospital Stay: Payer: PPO | Admitting: Oncology

## 2021-04-16 ENCOUNTER — Inpatient Hospital Stay: Payer: PPO | Attending: Oncology | Admitting: Oncology

## 2021-04-16 ENCOUNTER — Telehealth: Payer: Self-pay

## 2021-04-16 ENCOUNTER — Other Ambulatory Visit (HOSPITAL_COMMUNITY): Payer: Self-pay

## 2021-04-16 DIAGNOSIS — C9000 Multiple myeloma not having achieved remission: Secondary | ICD-10-CM

## 2021-04-16 NOTE — Progress Notes (Signed)
Patient/via wife verified using two identifiers for virtual visit via telephone today.  Patient and his wife are unsure if they would like to pursue further treatment.

## 2021-04-16 NOTE — Telephone Encounter (Signed)
Message received from triage that [facility] with [place] called for lab orders that will need to be drawn with [facility].  Dr. Grayland Ormond called [facility] with verbal order for weekly cbc, metb and then every 4 weeks: kappa/lambda free light chains- serum and approval for nursing 1x per week for 9 weeks.    Will fax lab orders along with approval for nursing to [phone].

## 2021-04-16 NOTE — Telephone Encounter (Signed)
Oral Oncology Patient Advocate Encounter  Prior Authorization for Revlimid has been approved.    PA# [id number] Effective dates: 04/16/21 through 04/16/22  Patients estimated co-pay is $2600.28.  Oral Oncology Clinic will continue to follow.   [facility] Patient [place] Phone [phone] Fax [phone] 04/16/2021 12:03 PM

## 2021-04-16 NOTE — Progress Notes (Signed)
[place]  Telephone:([phone]) [phone] Fax:(336) [phone]  ID: Jack [name] OB: [birth year]  MR#: [id number]  AST#:[id number]  Patient Care Team: [name], MD as PCP - General (Family Medicine) End, [name], MD as PCP - Cardiology (Cardiology) [name], MD (Endocrinology)  I connected with Jack [name] on 04/16/21 at 10:00 AM EDT by video enabled telemedicine visit and verified that I am speaking with the correct person using two identifiers.   I discussed the limitations, risks, security and privacy concerns of performing an evaluation and management service by telemedicine and the availability of in-person appointments. I also discussed with the patient that there may be a patient responsible charge related to this service. The patient expressed understanding and agreed to proceed.   Other persons participating in the visit and their role in the encounter: Patient, patient's wife, MD.  Patient's location: Home. Provider's location: Clinic.   CHIEF COMPLAINT: Stage II kappa chain myeloma.  INTERVAL HISTORY: Patient agreed to video assisted telemedicine visit for further evaluation and treatment planning.  His pain has become significantly worse that he no longer can tolerate XRT and canceled the rest of his appointments.  His performance status has declined, but this is secondary to pain.  He has no neurologic complaints.  He denies any recent fevers.  He has a poor appetite, but denies weight loss.  He has no chest pain, shortness of breath, cough, or hemoptysis.  He denies any nausea, vomiting, constipation, or diarrhea.  He has no urinary complaints.  Patient feels generally terrible, but offers no further specific complaints today.  REVIEW OF SYSTEMS:   Review of Systems  Constitutional:  Positive for malaise/fatigue. Negative for fever.  Respiratory: Negative.  Negative for cough, hemoptysis and shortness of breath.   Cardiovascular:  Negative.  Negative for chest pain and leg swelling.  Gastrointestinal: Negative.  Negative for abdominal pain.  Genitourinary: Negative.  Negative for dysuria.  Musculoskeletal:  Positive for back pain and joint pain.  Skin: Negative.  Negative for rash.  Neurological:  Positive for weakness. Negative for dizziness, focal weakness and headaches.  Psychiatric/Behavioral: Negative.  The patient is not nervous/anxious.    As per HPI. Otherwise, a complete review of systems is negative.  PAST MEDICAL HISTORY: Past Medical History:  Diagnosis Date   Elevated prostate specific antigen (PSA)    has been 7 for a year    GERD (gastroesophageal reflux disease)    History of colon polyps 2008   [facility],    History of kidney stones    Hyperlipidemia    Hypertension    Prostate hypertrophy    diagnosed at age 87 due to hematospermia   Renal disorder     PAST SURGICAL HISTORY: Past Surgical History:  Procedure Laterality Date   CATARACT EXTRACTION W/PHACO Left 01/10/2018   Procedure: CATARACT EXTRACTION PHACO AND INTRAOCULAR LENS PLACEMENT ([place]);  Surgeon: Birder Robson, MD;  Location: [facility] OR[facility];  Service: Ophthalmology;  Laterality: Left;  [name] 00:24.8 AP% 14.9 CDE 3.68 Fluid pack lot # [id number]   CATARACT EXTRACTION W/PHACO Right 01/25/2018   Procedure: CATARACT EXTRACTION PHACO AND INTRAOCULAR LENS PLACEMENT ([facility]);  Surgeon: Birder Robson, MD;  Location: [facility];  Service: Ophthalmology;  Laterality: Right;  [name] 00:42 AP% 10.8 CDE 4.59 Fluid pack lot # [id number]   COLON SURGERY     CYSTOSCOPY W/ URETERAL STENT PLACEMENT Right 10/16/2017   Procedure: right  URETERAL STENT PLACEMENT,cystoscopy bilateral stent removal,rretrograde;  Surgeon: Abbie Sons, MD;  Location: [facility];  Service: Urology;  Laterality: Right;   CYSTOSCOPY/URETEROSCOPY/HOLMIUM LASER/STENT PLACEMENT Right 12/16/2020   Procedure: CYSTOSCOPY/URETEROSCOPY/HOLMIUM LASER/STENT PLACEMENT;  Surgeon: Abbie Sons, MD;  Location: [facility] OR[facility];  Service: Urology;  Laterality: Right;   EXTRACORPOREAL SHOCK WAVE LITHOTRIPSY Right 12/11/2020   Procedure: EXTRACORPOREAL SHOCK WAVE LITHOTRIPSY (ESWL);  Surgeon: Abbie Sons, MD;  Location: [facility];  Service: Urology;  Laterality: Right;   IR KYPHO EA ADDL LEVEL THORACIC OR LUMBAR  02/02/2021   IR KYPHO LUMBAR INC FX REDUCE BONE BX UNI/BIL CANNULATION INC/IMAGING  02/02/2021   KIDNEY STONE SURGERY     KYPHOPLASTY N/A 03/12/2021   Procedure: Hewitt Shorts, L3;  Surgeon: Hessie Knows, MD;  Location: [facility] OR[facility];  Service: Orthopedics;  Laterality: N/A;   RESECTION SOFT TISSUE TUMOR LEG / ANKLE RADICAL  jan 2009   [facility],  right thigh/knee , nonmalignant   SMALL INTESTINE SURGERY  1946   implaed on picket fence, punctured stomach   TONSILLECTOMY      FAMILY HISTORY: Family History  Problem Relation Age of Onset   Hypertension Father    Hyperlipidemia Father    Cancer Sister        thyroid - dx in late 20's    ADVANCED DIRECTIVES (Y/N):  N  HEALTH MAINTENANCE: Social History   Tobacco Use   Smoking status: Former    Types: Cigarettes    Quit date: 07/05/1965    Years since quitting: 55.8   Smokeless tobacco: Never  Vaping Use   Vaping Use: Never used  Substance Use Topics   Alcohol use: Yes    Alcohol/week: 1.0 standard drink    Types: 1 Standard drinks or equivalent per week    Comment: occassionaly   Drug use: No     Colonoscopy:  PAP:  Bone density:  Lipid panel:  Allergies  Allergen Reactions   Quinolones     Aortic dilation contraindicates FQ   Amlodipine Other (See Comments)    LE edema   Azithromycin Nausea And Vomiting   Lisinopril Cough   Morphine And Related Nausea And Vomiting    Current Outpatient Medications  Medication Sig Dispense Refill   apixaban (ELIQUIS) 5 MG TABS tablet Take 1 tablet (5 mg total) by mouth 2 (two) times daily. Increase dose from 2.5 mg. 180 tablet 0   atorvastatin (LIPITOR) 40 MG  tablet Take 1 tablet (40 mg total) by mouth daily at 6 PM. 90 tablet 3   baclofen (LIORESAL) 10 MG tablet Take 1 tablet (10 mg total) by mouth 3 (three) times daily. 30 each 2   calcitonin, salmon, (MIACALCIN/FORTICAL) 200 UNIT/ACT nasal spray Place 1 spray into alternate nostrils daily. 3.7 mL 12   carvedilol (COREG) 25 MG tablet Take 1 tablet (25 mg total) by mouth 2 (two) times daily with a meal. 60 tablet 1   docusate sodium (COLACE) 100 MG capsule Take 1 capsule (100 mg total) by mouth 2 (two) times daily. 60 capsule 0   feeding supplement (ENSURE ENLIVE / ENSURE PLUS) LIQD Take 237 mLs by mouth 3 (three) times daily between meals. 237 mL 12   gabapentin (NEURONTIN) 300 MG capsule Take 2 capsules (600 mg total) by mouth 3 (three) times daily. Can give equivalent if cheaper. 180 capsule 2   lidocaine (XYLOCAINE) 5 % ointment Apply topically 3 (three) times daily as needed for mild pain or moderate pain. 35.44 g 0   Multiple Vitamin (MULTIVITAMIN WITH MINERALS) TABS tablet Take 1 tablet by  mouth daily.     Multiple Vitamins-Minerals (PRESERVISION AREDS PO) Take by mouth.     ondansetron (ZOFRAN) 8 MG tablet Take 1 tablet (8 mg total) by mouth every 8 (eight) hours as needed for nausea or vomiting. 60 tablet 2   oxyCODONE (OXYCONTIN) 10 mg 12 hr tablet Take 20 mg by mouth every 12 (twelve) hours.     oxyCODONE-acetaminophen (ROXICET) 5-325 MG/5ML solution Take by mouth every 6 (six) hours as needed for severe pain.     polyethylene glycol (MIRALAX / GLYCOLAX) 17 g packet Take 17 g by mouth 2 (two) times daily.  0   senna-docusate (SENOKOT-S) 8.6-50 MG tablet Take 1 tablet by mouth 2 (two) times daily. 30 tablet 0   tamsulosin (FLOMAX) 0.4 MG CAPS capsule TAKE 1 CAPSULE BY MOUTH EVERY DAY 90 capsule 1   traMADol (ULTRAM) 50 MG tablet Take 50 mg by mouth every 6 (six) hours as needed.     valACYclovir (VALTREX) 500 MG tablet Take 1 tablet (500 mg total) by mouth daily. 30 tablet 0   naloxone  (NARCAN) nasal spray 4 mg/0.1 mL SPRAY 1 SPRAY INTO ONE NOSTRIL AS DIRECTED FOR OPIOID OVERDOSE (TURN PERSON ON SIDE AFTER DOSE. IF NO RESPONSE IN 2-3 MINUTES OR PERSON RESPONDS BUT RELAPSES, REPEAT USING A NEW SPRAY DEVICE AND SPRAY INTO THE OTHER NOSTRIL. CALL 911 AFTER USE.) * EMERGENCY USE ONLY * (Patient not taking: Reported on 04/16/2021) 1 each 0   No current facility-administered medications for this visit.    OBJECTIVE: There were no vitals filed for this visit.    There is no height or weight on file to calculate BMI.    ECOG FS:3 - Symptomatic, >50% confined to bed  General: Well-developed, well-nourished, no acute distress.  Lying on couch. HEENT: Normocephalic. Neuro: Alert, answering all questions appropriately. Cranial nerves grossly intact. Psych: Normal affect.   LAB RESULTS:  Lab Results  Component Value Date   NA 138 04/03/2021   K 4.7 04/03/2021   CL 104 04/03/2021   CO2 28 04/03/2021   GLUCOSE 138 (H) 04/03/2021   BUN 45 (H) 04/03/2021   CREATININE 0.94 04/03/2021   CALCIUM 9.2 04/03/2021   PROT 6.0 (L) 03/26/2021   ALBUMIN 3.8 03/26/2021   AST 41 03/26/2021   ALT 25 03/26/2021   ALKPHOS 104 03/26/2021   BILITOT 1.4 (H) 03/26/2021   GFRNONAA >60 04/03/2021   GFRAA 29 (L) 10/16/2017    Lab Results  Component Value Date   WBC 6.7 04/03/2021   NEUTROABS 5.2 03/26/2021   HGB 11.0 (L) 04/03/2021   HCT 33.7 (L) 04/03/2021   MCV 96.6 04/03/2021   PLT 122 (L) 04/03/2021     STUDIES: MR THORACIC SPINE W WO CONTRAST  Result Date: 03/29/2021 CLINICAL DATA:  Initial evaluation for hematologic malignancy, staging. Multiple myeloma. EXAM: MRI THORACIC WITHOUT AND WITH CONTRAST TECHNIQUE: Multiplanar and multiecho pulse sequences of the thoracic spine were obtained without and with intravenous contrast. CONTRAST:  68m GADAVIST GADOBUTROL 1 MMOL/ML IV SOLN COMPARISON:  MRI of the lumbar spine from 03/27/2021 as well as previous MRI from 01/29/2021. FINDINGS:  Alignment: Physiologic with preservation of the normal thoracic kyphosis. Vertebrae: Abnormal heterogeneous signal intensity seen throughout the visualized bone marrow, presumably related to history of multiple myeloma. There is new abnormal STIR signal intensity involving the T10 and T11 vertebral bodies, new as compared to prior thoracic spine MRI from 01/29/2021, suggesting progressive disease. There is an associated pathologic compression fracture at the  inferior endplate of S28 with no more than mild central height loss and trace 2 mm bony retropulsion, new from prior. The marrow edema at this level is likely at least in part related to the fracture. 1 cm STIR hyperintense lesion involving the T8 vertebral body is stable. Additional subcentimeter STIR hyperintense lesions involving the T6 and T7 vertebral bodies also similar. T9 hemangioma noted. Additional compression deformities involving the T12 through L3 vertebral bodies with sequelae of prior vertebral augmentation again noted. Mild chronic compression deformities at the superior endplates of T3 and T4 are stable. Vertebral body height otherwise maintained. Cord: Normal signal and morphology. Previously identified soft tissue density within the ventral epidural space extending from L1 through L2-3 again noted, better characterized on prior MRI of the lumbar spine. No other extra osseous extension of tumor seen within the thoracic spine. Paraspinal and other soft tissues: Unremarkable. Disc levels: Mild for age multilevel disc desiccation seen throughout the thoracic spine. No significant disc bulge or focal disc herniation. No spinal stenosis. Foramina remain patent. IMPRESSION: 1. Abnormal heterogeneous signal intensity throughout the visualized bone marrow, consistent with history of multiple myeloma. Increased STIR signal intensity within the T10 and T11 vertebral bodies since prior thoracic spine MRI from 01/29/2021, suggesting progressive disease. 2.  New pathologic compression fracture at the inferior endplate of B15 with no more than mild central height loss and trace 2 mm bony retropulsion. No stenosis. 3. Small amount of extra osseous tumor within the ventral epidural space at L1 through L2-3, better characterized on prior lumbar spine MRI. No other epidural or extraosseous tumor within the thoracic spine. 4. No significant canal or foraminal stenosis. Electronically Signed   By: Jeannine Boga M.D.   On: 03/29/2021 04:31   MR Lumbar Spine W Wo Contrast  Result Date: 03/27/2021 CLINICAL DATA:  Low back pain, history of multiple myeloma EXAM: MRI LUMBAR SPINE WITHOUT AND WITH CONTRAST TECHNIQUE: Multiplanar and multiecho pulse sequences of the lumbar spine were obtained without and with intravenous contrast. CONTRAST:  7.47m GADAVIST GADOBUTROL 1 MMOL/ML IV SOLN COMPARISON:  01/29/2021 FINDINGS: Segmentation:  Standard. Alignment:  No significant listhesis. Vertebrae: There are pathologic compression fractures at T12-L4. Progressed since the prior MRI but present on other interval imaging with exception of L4. Cement augmentation is present at T12-L3. Minor endplate retropulsion, greatest at T12 and L1. There is mildly heterogeneous marrow signal with patchy STIR hyperintensity and enhancement. New mild ventral epidural soft tissue extension is present at L1 to L2-L3. Conus medullaris and cauda equina: Conus extends to the L1-L2 level. Conus and cauda equina appear normal. No abnormal intrathecal enhancement. Paraspinal and other soft tissues: Unremarkable. Disc levels: L1-L2:  No significant canal or foraminal stenosis. L2-L3: Disc bulge. Facet arthropathy with ligamentum flavum infolding. No significant canal or foraminal stenosis. L3-L4: Disc bulge. Facet arthropathy with ligamentum flavum infolding. No significant canal or foraminal stenosis. L4-L5: Disc bulge. Facet arthropathy with ligamentum flavum infolding. No significant canal or foraminal  stenosis. L5-S1:  No significant canal or foraminal stenosis. IMPRESSION: Pathologic compression fractures of T12-L4 with cement augmentation at T12-L3. Heterogeneous marrow signal a and enhancement likely reflecting known myeloma. There is new mild ventral epidural soft tissue extension at L1 to L2-L3. No significant canal or foraminal narrowing at any level. Electronically Signed   By: PMacy MisM.D.   On: 03/27/2021 14:39   CT ABDOMEN PELVIS W CONTRAST  Result Date: 03/26/2021 CLINICAL DATA:  Abdominal distension and abdominal pain EXAM: CT ABDOMEN AND  PELVIS WITH CONTRAST TECHNIQUE: Multidetector CT imaging of the abdomen and pelvis was performed using the standard protocol following bolus administration of intravenous contrast. CONTRAST:  121m OMNIPAQUE IOHEXOL 350 MG/ML SOLN COMPARISON:  03/11/2021 FINDINGS: Lower chest: Unremarkable. Hepatobiliary: No suspicious focal abnormality within the liver parenchyma. There is no evidence for gallstones, gallbladder wall thickening, or pericholecystic fluid. No intrahepatic or extrahepatic biliary dilation. Pancreas: No focal mass lesion. No dilatation of the main duct. No intraparenchymal cyst. No peripancreatic edema. Spleen: No splenomegaly. No focal mass lesion. Adrenals/Urinary Tract: No adrenal nodule or mass. Kidneys unremarkable. No evidence for hydroureter. The urinary bladder appears normal for the degree of distention. Stomach/Bowel: Tiny hiatal hernia. Stomach otherwise unremarkable. Duodenum is normally positioned as is the ligament of Treitz. No small bowel wall thickening. No small bowel dilatation. The terminal ileum is normal. The appendix is normal. No gross colonic mass. No colonic wall thickening. No substantial diverticular disease in the left colon. Colon is diffusely fluid-filled with fluid visible in the rectum, imaging findings that could be consistent diarrhea. No colonic wall thickening. Vascular/Lymphatic: There is moderate  atherosclerotic calcification of the abdominal aorta without aneurysm. There is no gastrohepatic or hepatoduodenal ligament lymphadenopathy. No retroperitoneal or mesenteric lymphadenopathy. No pelvic sidewall lymphadenopathy. Reproductive: Prostate gland is markedly enlarged. Other: No intraperitoneal free fluid. Musculoskeletal: Mesh in the left groin region is compatible with prior herniorrhaphy. Bony mineralization is diffusely heterogeneous in this patient with a history of multiple myeloma. Patient is status post multiple level vertebral augmentation in the lower thoracic and lumbar spine. IMPRESSION: 1. No acute findings in the abdomen or pelvis. 2. Fluid in the right and transverse colon with visible fluid in the rectum. Imaging findings could be consistent with diarrhea. No evidence for colonic wall thickening to suggest colitis. 3. Marked prostatomegaly. 4. Tiny hiatal hernia. 5. Aortic Atherosclerosis (ICD10-I70.0). Electronically Signed   By: EMisty StanleyM.D.   On: 03/26/2021 18:09   NM PET Image Initial (PI) Whole Body  Result Date: 03/24/2021 CLINICAL DATA:  Initial treatment strategy for concern for multiple myeloma. Gammopathy with multiple M spikes. Prior kyphoplasty 12/16/2020 at L2-3. EXAM: NUCLEAR MEDICINE PET WHOLE BODY TECHNIQUE: 9.8 mCi F-18 FDG was injected intravenously. Full-ring PET imaging was performed from the head to foot after the radiotracer. CT data was obtained and used for attenuation correction and anatomic localization. Fasting blood glucose: 91 mg/dl COMPARISON:  03/11/2021 abdominopelvic CT.  Chest CT 11/21/2017. FINDINGS: Mediastinal blood pool activity: SUV max 3.1 HEAD/NECK: No areas of abnormal hypermetabolism. Incidental CT findings: Cerebral atrophy is within normal variation for age. No cervical adenopathy. Left carotid atherosclerotic calcifications. CHEST: No thoracic nodal hypermetabolism. Distal esophageal hypermetabolism in the setting of a small hiatal  hernia. This measures a S.U.V. max of 4.6, including on approximately 139/3. No pulmonary parenchymal hypermetabolism. Incidental CT findings: Aortic and coronary artery calcification. Ascending aortic dilatation again identified, including up to 4.6 cm. Mild cardiomegaly. Calcified left-sided pleural plaques. Centrilobular emphysema. No thoracic adenopathy. ABDOMEN/PELVIS: No abdominopelvic parenchymal or nodal hypermetabolism. Incidental CT findings: Normal adrenal glands. Proximal gastric underdistention. No abdominopelvic adenopathy. Moderate prostatomegaly. SKELETON: Multifocal thoracolumbar spine hypermetabolism. This is seen at the previously augmented levels and likely reactive. However, there is also hypermetabolism within the T11 vertebral body at a S.U.V. max of 5.1. Incidental CT findings: Diffuse heterogeneous osseous density, most consistent with myeloma involvement. EXTREMITIES: No areas of abnormal hypermetabolism. Incidental CT findings: Loose bodies about the right knee are likely within a popliteal cyst. Subtle cortical  irregularity which is most apparent in the femoral, less so humeral shafts, suspicious for myeloma involvement. IMPRESSION: 1. Heterogeneity throughout the marrow space, most consistent with the clinical history of multiple myeloma. The only suspicious area of osseous hypermetabolism is within the T11 vertebral body. 2. No findings of extra-osseous hypermetabolic myeloma. 3. Distal esophageal hypermetabolism, suspicious for esophagitis. Concurrent small hiatal hernia. 4. Incidental findings, including: Similar ascending aortic aneurysm. Aortic atherosclerosis (ICD10-I70.0), coronary artery atherosclerosis and emphysema (ICD10-J43.9). Left-sided pleural partially calcified pleural plaques may relate to prior empyema or hemothorax. Prostatomegaly. Electronically Signed   By: Abigail Miyamoto M.D.   On: 03/24/2021 16:05   DG Chest Portable 1 View  Result Date: 03/26/2021 CLINICAL  DATA:  87 year old male with right flank pain and rib fracture. EXAM: PORTABLE CHEST 1 VIEW COMPARISON:  Chest radiograph dated 02/02/2021. FINDINGS: No focal consolidation, pleural effusion, or pneumothorax. Apparent two small adjacent calcified nodules in the left upper lobe with combined dimension of 9 mm. Attention on follow-up imaging recommended. The cardiac silhouette is within limits. No acute osseous pathology. IMPRESSION: 1. No acute cardiopulmonary process. 2. A 9 mm left upper lobe calcified nodule. Electronically Signed   By: Anner Crete M.D.   On: 03/26/2021 19:08    ASSESSMENT: Stage II Kappa chain myeloma.  PLAN:    Stage II kappa chain myeloma: (11:14 translocation, high risk) SPEP essentially negative and immunoglobulins are within normal limits, but patient has a significantly elevated kappa free light chain.  Patient completed cycle 1 of single agent Velcade while in the hospital.  He was receiving daily XRT for pain, but discontinued secondary to declining performance status and difficulty getting into clinic.  Patient expressed interest in continuing treatment, although given his limitations secondary to pain and cannot come into clinic.  We briefly discussed hospice today as well.  Plan is Revlimid 25 mg daily with 20 mg dexamethasone weekly for 21 days with 7 days off.  Patient will get weekly laboratory work with home health and have periodic virtual visits.  Patient will have video assisted telemedicine visit visit in approximately 2 to 3 weeks to assess his toleration of Revlimid. Pain: Patient has discontinued XRT.  Continue current narcotic regimen and gabapentin.  Palliative care appointment scheduled for next week. Anemia: Continue weekly labs with home health.  I provided 30 minutes of face-to-face video visit time during this encounter which included chart review, counseling, and coordination of care as documented above.   Patient expressed understanding and was in  agreement with this plan. He also understands that He can call clinic at any time with any questions, concerns, or complaints.   Cancer Staging No matching staging information was found for the patient.  Lloyd Huger, MD   04/16/2021 2:17 PM

## 2021-04-16 NOTE — Telephone Encounter (Signed)
Oral Oncology Patient Advocate Encounter   Received notification from [employer] that prior authorization for Revlimid is required.   PA submitted on CoverMyMeds Key [id number] Status is pending   Oral Oncology Clinic will continue to follow.  [place] Patient [place] Phone [phone] Fax [phone] 04/16/2021 11:39 AM

## 2021-04-16 NOTE — Telephone Encounter (Signed)
Oral Oncology Student Pharmacist Encounter  Received new prescription for Revlimid (lenalidomide) for the treatment of multiple myeloma in conjunction with dexamethasone, planned duration is until disease progression or unacceptable toxicity.  Labs from 04/03/21 assessed, Hgb 11.0, WBC (6.7) and PLT (122) trending down, Scr 0.94. Prescription dose and frequency assessed and are appropriate. Geriatric patients have a greater risk of renal failure. Closely monitor for SCr and adjust dose as needed. For CrCl of 30-60 mL/min, use 10 mg QD and increase dose to 15 mg QD after 2 cycles if patient tolerating.  Current medication list in Epic reviewed, no DDIs with Revlimid identified.  Evaluated chart and no patient barriers to medication adherence identified.   Prescription has been e-scribed to [facility] for benefits analysis and approval.  Oral Oncology Clinic will continue to follow for insurance authorization, copayment issues, initial counseling and start date.  Patient agreed to treatment on 04/16/21 per MD documentation.  Wynelle Cleveland, PharmD Candidate [facility] [place] [facility] [phone]  04/16/2021 3:08 PM

## 2021-04-17 ENCOUNTER — Ambulatory Visit: Payer: PPO

## 2021-04-17 ENCOUNTER — Telehealth: Payer: Self-pay | Admitting: Pharmacy Technician

## 2021-04-17 MED ORDER — LENALIDOMIDE 25 MG PO CAPS: 25.0000 mg | ORAL_CAPSULE | Freq: Every day | ORAL | 0 refills | Status: DC

## 2021-04-17 MED ORDER — DEXAMETHASONE 4 MG PO TABS: 20.0000 mg | ORAL_TABLET | ORAL | 4 refills | Status: DC

## 2021-04-17 MED FILL — DEXAMETHASONE 4 MG TABLET: 27 days supply | Qty: 20 | Fill #0

## 2021-04-17 NOTE — Telephone Encounter (Signed)
Oral Oncology Patient Advocate Encounter  Was successful in securing patient a $12,000 grant from [facility] to provide copayment coverage for Revlimid.  This will keep the out of pocket expense at $0.     [employer] ID: [id number]  I have spoken with the patient.   The billing information is as follows and has been shared with [employer].    RxBin: [id number] PCN: [id number] Member ID: [id number] Group ID: [id number] Dates of Eligibility: 03/18/21 through 03/17/22  Fund:  [place] Patient [place] Phone [phone] Fax [phone] 04/17/2021 4:06 PM

## 2021-04-20 ENCOUNTER — Inpatient Hospital Stay: Payer: PPO | Attending: Oncology | Admitting: Pharmacist

## 2021-04-20 ENCOUNTER — Other Ambulatory Visit: Payer: Self-pay

## 2021-04-20 DIAGNOSIS — C9000 Multiple myeloma not having achieved remission: Secondary | ICD-10-CM

## 2021-04-20 NOTE — Telephone Encounter (Signed)
[employer] will deliver patients Revlimid on 04/21/21.

## 2021-04-20 NOTE — Progress Notes (Signed)
[facility] [place]  Telephone:([phone] Fax:(336) [phone]  Patient Care Team: [name], MD as PCP - General (Family Medicine) End, [name], MD as PCP - Cardiology (Cardiology) [name], MD (Endocrinology)   Name of the patient: Jack [name]  [id number]  Dec 15, 1932   Date of visit: 04/20/21  Virtual visit: I connected with  Jack [name] on 04/20/21 at  1:30 PM EDT by a video enabled telemedicine application and verified that I am speaking with the correct person using two identifiers. I discussed the limitations of evaluation and management by telemedicine and the availability of in person appointments. The patient expressed understanding and agreed to proceed. Locations: Patient: home , Provider: office  HPI: Patient is a 87 y.o. male with newly diagnosed multiple myeloma. Planned treatment with Revlimid (lenalidomide) and dexamethasone. An all oral regimen was needed for Jack [name] because currently due to his disease his is unable to come to clinic for treatment.  Reason for Consult: Revlimid (lenalidomide) oral chemotherapy education.   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[place])    03/27/2021 -  Chemotherapy    Patient is on Treatment Plan: MYELOMA NON-TRANSPLANT CANDIDATES VRD SQ Q21D        04/16/2021 Cancer Staging   Staging form: Plasma Cell Myeloma and Plasma Cell Disorders, AJCC 8th Edition - Clinical stage from  04/16/2021: Albumin (g/dL): 3.8, ISS: Stage II, High-risk cytogenetics: Absent, LDH: Unknown - Signed by Lloyd Huger, MD on 04/16/2021  Albumin range (g/dL): Greater than or equal to 3.5  Cytogenetics: t(11;14) translocation  Serum calcium level: Normal  Serum creatinine level: Normal  Bone disease on imaging: Present      ALLERGIES:  is allergic to quinolones, amlodipine, azithromycin, lisinopril, and morphine and related.  MEDICATIONS:  Current Outpatient Medications  Medication Sig Dispense Refill   apixaban (ELIQUIS) 5 MG TABS tablet Take 1 tablet (5 mg total) by mouth 2 (two) times daily. Increase dose from 2.5 mg. 180 tablet 0   atorvastatin (LIPITOR) 40 MG tablet Take 1 tablet (40 mg total) by mouth daily at 6 PM. 90 tablet 3   baclofen (LIORESAL) 10 MG tablet Take 1 tablet (10 mg total) by mouth 3 (three) times daily. 30 each 2   calcitonin, salmon, (MIACALCIN/FORTICAL) 200 UNIT/ACT nasal spray Place 1 spray into alternate nostrils daily. 3.7 mL 12   carvedilol (COREG) 25 MG tablet Take 1 tablet (25 mg total) by mouth 2 (two) times daily with a meal. 60 tablet 1   dexamethasone (DECADRON) 4 MG tablet Take 5 tablets (20 mg total) by mouth once a week. 20 tablet 4   docusate sodium (COLACE) 100 MG capsule Take 1 capsule (100 mg total) by mouth 2 (two) times daily. 60 capsule 0   feeding supplement (ENSURE ENLIVE / ENSURE PLUS) LIQD Take 237 mLs by mouth 3 (three) times daily between meals. 237 mL 12  gabapentin (NEURONTIN) 300 MG capsule Take 2 capsules (600 mg total) by mouth 3 (three) times daily. Can give equivalent if cheaper. 180 capsule 2   lenalidomide (REVLIMID) 25 MG capsule Take 1 capsule (25 mg total) by mouth daily. Take for 21 days, then hold for 7 days. Repeat every 28 days. 21 capsule 0   lidocaine (XYLOCAINE) 5 % ointment Apply topically 3 (three) times daily as needed for mild pain or moderate pain. 35.44 g 0   Multiple Vitamin (MULTIVITAMIN WITH MINERALS)  TABS tablet Take 1 tablet by mouth daily.     Multiple Vitamins-Minerals (PRESERVISION AREDS PO) Take by mouth.     naloxone (NARCAN) nasal spray 4 mg/0.1 mL SPRAY 1 SPRAY INTO ONE NOSTRIL AS DIRECTED FOR OPIOID OVERDOSE (TURN PERSON ON SIDE AFTER DOSE. IF NO RESPONSE IN 2-3 MINUTES OR PERSON RESPONDS BUT RELAPSES, REPEAT USING A NEW SPRAY DEVICE AND SPRAY INTO THE OTHER NOSTRIL. CALL 911 AFTER USE.) * EMERGENCY USE ONLY * (Patient not taking: Reported on 04/16/2021) 1 each 0   ondansetron (ZOFRAN) 8 MG tablet Take 1 tablet (8 mg total) by mouth every 8 (eight) hours as needed for nausea or vomiting. 60 tablet 2   oxyCODONE (OXYCONTIN) 10 mg 12 hr tablet Take 20 mg by mouth every 12 (twelve) hours.     oxyCODONE-acetaminophen (ROXICET) 5-325 MG/5ML solution Take by mouth every 6 (six) hours as needed for severe pain.     polyethylene glycol (MIRALAX / GLYCOLAX) 17 g packet Take 17 g by mouth 2 (two) times daily.  0   senna-docusate (SENOKOT-S) 8.6-50 MG tablet Take 1 tablet by mouth 2 (two) times daily. 30 tablet 0   tamsulosin (FLOMAX) 0.4 MG CAPS capsule TAKE 1 CAPSULE BY MOUTH EVERY DAY 90 capsule 1   traMADol (ULTRAM) 50 MG tablet Take 50 mg by mouth every 6 (six) hours as needed.     valACYclovir (VALTREX) 500 MG tablet Take 1 tablet (500 mg total) by mouth daily. 30 tablet 0   No current facility-administered medications for this visit.    VITAL SIGNS: There were no vitals taken for this visit. There were no vitals filed for this visit.  Estimated body mass index is 23.95 kg/m as calculated from the following:   Height as of 03/27/21: '5\' 9"'  (1.753 m).   Weight as of 04/07/21: 73.6 kg (162 lb 3.2 oz).  LABS: CBC:    Component Value Date/Time   WBC 6.7 04/03/2021 0552   HGB 11.0 (L) 04/03/2021 0552   HGB 11.0 (L) 02/24/2021 1014   HCT 33.7 (L) 04/03/2021 0552   HCT 35.3 (L) 02/24/2021 1014   PLT 122 (L) 04/03/2021 0552   PLT 249 02/24/2021 1014   MCV 96.6 04/03/2021 0552   MCV 96  02/24/2021 1014   MCV 90 01/24/2013 0819   NEUTROABS 5.2 03/26/2021 1549   NEUTROABS 7.4 (H) 01/24/2013 0819   LYMPHSABS 1.4 03/26/2021 1549   LYMPHSABS 1.2 01/24/2013 0819   MONOABS 0.3 03/26/2021 1549   MONOABS 0.3 01/24/2013 0819   EOSABS 0.0 03/26/2021 1549   EOSABS 0.0 01/24/2013 0819   BASOSABS 0.0 03/26/2021 1549   BASOSABS 0.0 01/24/2013 0819   Comprehensive Metabolic Panel:    Component Value Date/Time   NA 138 04/03/2021 0552   NA 136 02/24/2021 1014   NA 138 01/24/2013 0819   K 4.7 04/03/2021 0552   K 3.8 01/24/2013 0819   CL 104 04/03/2021 0552   CL 105 01/24/2013 0819  CO2 28 04/03/2021 0552   CO2 25 01/24/2013 0819   BUN 45 (H) 04/03/2021 0552   BUN 23 02/24/2021 1014   BUN 20 (H) 01/24/2013 0819   CREATININE 0.94 04/03/2021 0552   CREATININE 1.52 (H) 01/24/2013 0819   GLUCOSE 138 (H) 04/03/2021 0552   GLUCOSE 171 (H) 01/24/2013 0819   CALCIUM 9.2 04/03/2021 0552   CALCIUM 10.4 (H) 02/19/2021 1229   AST 41 03/26/2021 1549   AST 18 01/24/2013 0819   ALT 25 03/26/2021 1549   ALT 15 01/24/2013 0819   ALKPHOS 104 03/26/2021 1549   ALKPHOS 74 01/24/2013 0819   BILITOT 1.4 (H) 03/26/2021 1549   BILITOT 0.4 02/24/2021 1014   BILITOT 0.5 01/24/2013 0819   PROT 6.0 (L) 03/26/2021 1549   PROT 6.2 02/24/2021 1014   PROT 6.3 (L) 01/24/2013 0819   ALBUMIN 3.8 03/26/2021 1549   ALBUMIN 4.5 02/24/2021 1014   ALBUMIN 3.6 01/24/2013 0819     Present during today's visit: patient and his wife Sharyn Creamer is the primary caregiver for Jack [name] and she was the primary person educated today  Assessment and Plan-  Start plan: Patient will start his lenalidomide and dexamethasone tomorrow, 04/21/21   Patient Education I spoke with [name] for overview of new oral chemotherapy medication: Revlimid (lenalidomide) for the treatment of multiple myeloma in conjunction with dexamethasone, planned duration is until disease progression or unacceptable toxicity.    Administration: Counseled patient on administration, dosing, side effects, monitoring, drug-food interactions, safe handling, storage, and disposal. Patient will take 1 capsule (25 mg total) by mouth daily. Take for 21 days, then hold for 7 days. Repeat every 28 days.  Side Effects: Side effects include but not limited to: rash/itchy skin, decreased wbc/hgb/plt, diarrhea or constipation, fatigue, N/V.    Bowel movements: currently patient has issues with constipation, if diarrhea occurs they know to stop the constipation medication and start using loperamide as needed. Rash: Mrs. Nunes will be on the look out for any rash/itchy skin with the initiation of lenalidomide  Drug-drug Interactions (DDI): No DDIs with lenalidomide identified  Adherence: After discussion with [name] no patient barriers to medication adherence identified.  Reviewed with patient importance of keeping a medication schedule and plan for any missed doses.  [name] voiced understanding and appreciation. All questions answered. Medication handout and calendar provided, via email and mail.  Provided patient with [facility] phone number. Patient knows to call the office with questions or concerns. [facility] will continue to follow.  Patient expressed understanding and was in agreement with this plan. He also understands that He can call clinic at any time with any questions, concerns, or complaints.   Medication Access Issues: [facility] will be delivering medication on Tuesday 04/21/21  Follow-up plan: patient will follow-up with clinic virtually next week  Thank you for allowing me to participate in the care of this patient.   Time Total: 30 mins  Visit consisted of counseling and education on dealing with issues of symptom management in the setting of serious and potentially life-threatening illness.Greater than 50%  of this time was spent counseling and  coordinating care related to the above assessment and plan.  Signed by: Darl Pikes, PharmD, BCPS, Salley Slaughter, CPP Hematology/Oncology Clinical Pharmacist Practitioner [facility] [phone]  04/20/2021 10:28 AM

## 2021-04-21 ENCOUNTER — Encounter: Payer: Self-pay | Admitting: Hospice and Palliative Medicine

## 2021-04-21 ENCOUNTER — Other Ambulatory Visit: Payer: Self-pay

## 2021-04-21 ENCOUNTER — Telehealth: Payer: Self-pay | Admitting: *Deleted

## 2021-04-21 MED ORDER — OXYCODONE HCL ER 10 MG PO T12A: 20.0000 mg | EXTENDED_RELEASE_TABLET | Freq: Two times a day (BID) | ORAL | 0 refills | Status: DC

## 2021-04-21 NOTE — Telephone Encounter (Signed)
Jack [name] from [place] [facility] called requesting information about Dr. Grayland Ormond for their medical records. Phone [phone].

## 2021-04-21 NOTE — Telephone Encounter (Signed)
Spoke to Mrs. Royster and they did request patient to be evaluated by [facility] when first diagnosed.  They were contacted by that facility to do a telehealth visit tomorrow.   Called [facility] to provide requested information of name of our office, fax number, phone number, MD NPI and license  #.

## 2021-04-22 ENCOUNTER — Encounter: Payer: Self-pay | Admitting: Internal Medicine

## 2021-04-22 ENCOUNTER — Other Ambulatory Visit: Payer: Self-pay

## 2021-04-22 ENCOUNTER — Encounter: Payer: Self-pay | Admitting: Oncology

## 2021-04-22 ENCOUNTER — Telehealth: Payer: Self-pay | Admitting: *Deleted

## 2021-04-22 MED FILL — OXYCONTIN ER 10 MG TABLET: 30 days supply | Qty: 60 | Fill #0

## 2021-04-22 NOTE — Telephone Encounter (Signed)
Jack [name] from [facility] called to obtain additional extension orders for the following:  Twice a week for 4 weeks, then 1 time a week for x 4   Home health aide- twice a week   Please call [facility] at [phone] to confirm if Dr. Grayland Ormond is ok with this.

## 2021-04-23 MED ORDER — BACLOFEN 10 MG PO TABS: 10.0000 mg | ORAL_TABLET | Freq: Three times a day (TID) | ORAL | 2 refills | Status: DC

## 2021-04-23 MED FILL — BACLOFEN 10 MG TABLET: 10 days supply | Qty: 30 | Fill #1

## 2021-04-23 NOTE — Telephone Encounter (Signed)
Left message on [facility] confidential VM with verbal orders.

## 2021-04-24 ENCOUNTER — Encounter: Payer: Self-pay | Admitting: Internal Medicine

## 2021-04-24 ENCOUNTER — Telehealth: Payer: Self-pay | Admitting: *Deleted

## 2021-04-24 NOTE — Telephone Encounter (Signed)
Verbal orders given  

## 2021-04-24 NOTE — Telephone Encounter (Signed)
OT Requests verbal orders as follows: Old episode 1 week 1 New episode 1 week 1

## 2021-04-25 NOTE — Progress Notes (Signed)
[place]  [phone]) [phone] Fax:(336) [phone]  ID: Jack [name] OB: 1933-01-23  MR#: [id number]  LHT#:[id number]  Patient Care Team: [name], MD as PCP - General (Family Medicine) End, [name], MD as PCP - Cardiology (Cardiology) [name], MD (Endocrinology)   I connected with Jack [name] on 04/29/21 at  2:30 PM EDT by video enabled telemedicine visit and verified that I am speaking with the correct person using two identifiers.   I discussed the limitations, risks, security and privacy concerns of performing an evaluation and management service by telemedicine and the availability of in-person appointments. I also discussed with the patient that there may be a patient responsible charge related to this service. The patient expressed understanding and agreed to proceed.   Other persons participating in the visit and their role in the encounter: Patient, patient's wife, MD.  Patient's location: Home. Provider's location: Clinic.  CHIEF COMPLAINT: Stage II kappa chain myeloma.  INTERVAL HISTORY: Patient agreed to video assisted telemedicine visit for further evaluation and to assess his toleration of Revlimid.  He reports his pain and mobility have improved, but his performance status is still significantly reduced and he sleeps much of the day.  He is tolerating Revlimid without significant side effects. He has no neurologic complaints.  He denies any recent fevers.  He has a poor appetite, but denies weight loss.  He has no chest pain, shortness of breath, cough, or hemoptysis.  He denies any nausea, vomiting, constipation, or diarrhea.  He has no urinary complaints.  Patient offers no further specific complaints today.  REVIEW OF SYSTEMS:   Review of Systems  Constitutional:  Positive for malaise/fatigue. Negative for fever.  Respiratory: Negative.  Negative for cough, hemoptysis and shortness of breath.   Cardiovascular:  Negative.  Negative for chest pain and leg swelling.  Gastrointestinal: Negative.  Negative for abdominal pain.  Genitourinary: Negative.  Negative for dysuria.  Musculoskeletal:  Positive for back pain and joint pain.  Skin: Negative.  Negative for rash.  Neurological:  Positive for weakness. Negative for dizziness, focal weakness and headaches.  Psychiatric/Behavioral: Negative.  The patient is not nervous/anxious.    As per HPI. Otherwise, a complete review of systems is negative.  PAST MEDICAL HISTORY: Past Medical History:  Diagnosis Date   Elevated prostate specific antigen (PSA)    has been 7 for a year    GERD (gastroesophageal reflux disease)    History of colon polyps 2008   [facility],    History of kidney stones    Hyperlipidemia    Hypertension    Prostate hypertrophy    diagnosed at age 82 due to hematospermia   Renal disorder     PAST SURGICAL HISTORY: Past Surgical History:  Procedure Laterality Date   CATARACT EXTRACTION W/PHACO Left 01/10/2018   Procedure: CATARACT EXTRACTION PHACO AND INTRAOCULAR LENS PLACEMENT ([place]);  Surgeon: Birder Robson, MD;  Location: [facility];  Service: Ophthalmology;  Laterality: Left;  [name] 00:24.8 AP% 14.9 CDE 3.68 Fluid pack lot # [id number]   CATARACT EXTRACTION W/PHACO Right 01/25/2018   Procedure: CATARACT EXTRACTION PHACO AND INTRAOCULAR LENS PLACEMENT (IOC);  Surgeon: Birder Robson, MD;  Location: [facility];  Service: Ophthalmology;  Laterality: Right;  [name] 00:42 AP% 10.8 CDE 4.59 Fluid pack lot # [id number]   COLON SURGERY     CYSTOSCOPY W/ URETERAL STENT PLACEMENT Right 10/16/2017   Procedure: right  URETERAL STENT PLACEMENT,cystoscopy bilateral stent removal,rretrograde;  Surgeon: Abbie Sons, MD;  Location: [facility] OR[facility];  Service: Urology;  Laterality: Right;   CYSTOSCOPY/URETEROSCOPY/HOLMIUM LASER/STENT PLACEMENT Right 12/16/2020   Procedure: CYSTOSCOPY/URETEROSCOPY/HOLMIUM LASER/STENT PLACEMENT;  Surgeon: Abbie Sons, MD;  Location: [facility];  Service: Urology;  Laterality: Right;   EXTRACORPOREAL SHOCK WAVE LITHOTRIPSY Right 12/11/2020   Procedure: EXTRACORPOREAL SHOCK WAVE LITHOTRIPSY (ESWL);  Surgeon: Abbie Sons, MD;  Location: [facility] OR[facility];  Service: Urology;  Laterality: Right;   IR KYPHO EA ADDL LEVEL THORACIC OR LUMBAR  02/02/2021   IR KYPHO LUMBAR INC FX REDUCE BONE BX UNI/BIL CANNULATION INC/IMAGING  02/02/2021   KIDNEY STONE SURGERY     KYPHOPLASTY N/A 03/12/2021   Procedure: Hewitt Shorts, L3;  Surgeon: Hessie Knows, MD;  Location: [facility];  Service: Orthopedics;  Laterality: N/A;   RESECTION SOFT TISSUE TUMOR LEG / ANKLE RADICAL  jan 2009   [facility],  right thigh/knee , nonmalignant   SMALL INTESTINE SURGERY  1946   implaed on picket fence, punctured stomach   TONSILLECTOMY      FAMILY HISTORY: Family History  Problem Relation Age of Onset   Hypertension Father    Hyperlipidemia Father    Cancer Sister        thyroid - dx in late 20's    ADVANCED DIRECTIVES (Y/N):  N  HEALTH MAINTENANCE: Social History   Tobacco Use   Smoking status: Former    Types: Cigarettes    Quit date: 07/05/1965    Years since quitting: 55.8   Smokeless tobacco: Never  Vaping Use   Vaping Use: Never used  Substance Use Topics   Alcohol use: Yes    Alcohol/week: 1.0 standard drink    Types: 1 Standard drinks or equivalent per week    Comment: occassionaly   Drug use: No     Colonoscopy:  PAP:  Bone density:  Lipid panel:  Allergies  Allergen Reactions   Quinolones     Aortic dilation contraindicates FQ   Amlodipine Other (See Comments)    LE edema   Azithromycin Nausea And Vomiting   Lisinopril Cough   Morphine And Related Nausea And Vomiting    Current Outpatient Medications  Medication Sig Dispense Refill   apixaban (ELIQUIS) 5 MG TABS tablet Take 1 tablet (5 mg total) by mouth 2 (two) times daily. Increase dose from 2.5 mg. 180 tablet 0   atorvastatin (LIPITOR) 40 MG  tablet Take 1 tablet (40 mg total) by mouth daily at 6 PM. 90 tablet 3   baclofen (LIORESAL) 10 MG tablet Take 1 tablet (10 mg total) by mouth 3 (three) times daily. 30 each 2   calcitonin, salmon, (MIACALCIN/FORTICAL) 200 UNIT/ACT nasal spray Place 1 spray into alternate nostrils daily. 3.7 mL 12   carvedilol (COREG) 25 MG tablet Take 1 tablet (25 mg total) by mouth 2 (two) times daily with a meal. 60 tablet 1   dexamethasone (DECADRON) 4 MG tablet Take 5 tablets (20 mg total) by mouth once a week. 20 tablet 4   docusate sodium (COLACE) 100 MG capsule Take 1 capsule (100 mg total) by mouth 2 (two) times daily. 60 capsule 0   feeding supplement (ENSURE ENLIVE / ENSURE PLUS) LIQD Take 237 mLs by mouth 3 (three) times daily between meals. 237 mL 12   gabapentin (NEURONTIN) 300 MG capsule Take 2 capsules (600 mg total) by mouth 3 (three) times daily. Can give equivalent if cheaper. 180 capsule 2   lenalidomide (REVLIMID) 25 MG capsule Take 1 capsule (25 mg total) by mouth daily.  Take for 21 days, then hold for 7 days. Repeat every 28 days. 21 capsule 0   lidocaine (XYLOCAINE) 5 % ointment Apply topically 3 (three) times daily as needed for mild pain or moderate pain. 35.44 g 0   Multiple Vitamin (MULTIVITAMIN WITH MINERALS) TABS tablet Take 1 tablet by mouth daily.     Multiple Vitamins-Minerals (PRESERVISION AREDS PO) Take by mouth.     ondansetron (ZOFRAN) 8 MG tablet Take 1 tablet (8 mg total) by mouth every 8 (eight) hours as needed for nausea or vomiting. 60 tablet 2   oxyCODONE (OXYCONTIN) 10 mg 12 hr tablet Take 2 tablets (20 mg total) by mouth every 12 (twelve) hours. 60 tablet 0   oxyCODONE-acetaminophen (ROXICET) 5-325 MG/5ML solution Take by mouth every 6 (six) hours as needed for severe pain.     polyethylene glycol (MIRALAX / GLYCOLAX) 17 g packet Take 17 g by mouth 2 (two) times daily.  0   senna-docusate (SENOKOT-S) 8.6-50 MG tablet Take 1 tablet by mouth 2 (two) times daily. 30 tablet 0    tamsulosin (FLOMAX) 0.4 MG CAPS capsule TAKE 1 CAPSULE BY MOUTH EVERY DAY 90 capsule 1   valACYclovir (VALTREX) 500 MG tablet Take 1 tablet (500 mg total) by mouth daily. 30 tablet 0   naloxone (NARCAN) nasal spray 4 mg/0.1 mL SPRAY 1 SPRAY INTO ONE NOSTRIL AS DIRECTED FOR OPIOID OVERDOSE (TURN PERSON ON SIDE AFTER DOSE. IF NO RESPONSE IN 2-3 MINUTES OR PERSON RESPONDS BUT RELAPSES, REPEAT USING A NEW SPRAY DEVICE AND SPRAY INTO THE OTHER NOSTRIL. CALL 911 AFTER USE.) * EMERGENCY USE ONLY * (Patient not taking: No sig reported) 1 each 0   traMADol (ULTRAM) 50 MG tablet Take 50 mg by mouth every 6 (six) hours as needed. (Patient not taking: Reported on 04/28/2021)     No current facility-administered medications for this visit.    OBJECTIVE: There were no vitals filed for this visit.    There is no height or weight on file to calculate BMI.    ECOG FS:3 - Symptomatic, >50% confined to bed  General: Well-developed, well-nourished, no acute distress. HEENT: Normocephalic. Neuro: Alert, answering all questions appropriately. Cranial nerves grossly intact. Psych: Normal affect.   LAB RESULTS:  Lab Results  Component Value Date   NA 138 04/03/2021   K 4.7 04/03/2021   CL 104 04/03/2021   CO2 28 04/03/2021   GLUCOSE 138 (H) 04/03/2021   BUN 45 (H) 04/03/2021   CREATININE 0.94 04/03/2021   CALCIUM 9.2 04/03/2021   PROT 6.0 (L) 03/26/2021   ALBUMIN 3.8 03/26/2021   AST 41 03/26/2021   ALT 25 03/26/2021   ALKPHOS 104 03/26/2021   BILITOT 1.4 (H) 03/26/2021   GFRNONAA >60 04/03/2021   GFRAA 29 (L) 10/16/2017    Lab Results  Component Value Date   WBC 6.7 04/03/2021   NEUTROABS 5.2 03/26/2021   HGB 11.0 (L) 04/03/2021   HCT 33.7 (L) 04/03/2021   MCV 96.6 04/03/2021   PLT 122 (L) 04/03/2021     STUDIES: No results found.  ASSESSMENT: Stage II Kappa chain myeloma.  PLAN:    Stage II kappa chain myeloma: (11:14 translocation, high risk) SPEP essentially negative and  immunoglobulins are within normal limits, but patient has a significantly elevated kappa free light chain.  Patient completed cycle 1 of single agent Velcade while in the hospital.  He was receiving daily XRT for pain, but discontinued secondary to declining performance status and difficulty getting into  clinic.  Patient expressed interest in continuing treatment, although given his limitations secondary to pain and cannot come into clinic.  Because of this, patient was initiated on single agent Revlimid 25 mg daily for 21 days, with 7 days off.  He is also taking 20 mg dexamethasone weekly.  Continue cycle 1 of treatment for approximately 2 more weeks.  Patient will have video assisted telemedicine visit in 3 weeks prior to cycle 2.  Continue weekly lab work with home health.  Appreciate palliative care and clinical pharmacy input.   Pain: Patient has discontinued XRT.  Continue current narcotic regimen and gabapentin.  Anemia: Continue weekly labs with home health.   I provided 30 minutes of face-to-face video visit time during this encounter which included chart review, counseling, and coordination of care as documented above.   Patient expressed understanding and was in agreement with this plan. He also understands that He can call clinic at any time with any questions, concerns, or complaints.   Cancer Staging Kappa light chain myeloma ([place]) Staging form: Plasma Cell Myeloma and Plasma Cell Disorders, AJCC 8th Edition - Clinical stage from 04/16/2021: Albumin (g/dL): 3.8, ISS: Stage II, High-risk cytogenetics: Absent, LDH: Unknown - Signed by Lloyd Huger, MD on 04/16/2021 Albumin range (g/dL): Greater than or equal to 3.5 Cytogenetics: t(11;14) translocation Serum calcium level: Normal Serum creatinine level: Normal Bone disease on imaging: Present  Lloyd Huger, MD   04/29/2021 7:46 AM

## 2021-04-27 ENCOUNTER — Encounter: Payer: Self-pay | Admitting: Internal Medicine

## 2021-04-27 DIAGNOSIS — C9000 Multiple myeloma not having achieved remission: Secondary | ICD-10-CM | POA: Diagnosis not present

## 2021-04-27 DIAGNOSIS — L900 Lichen sclerosus et atrophicus: Secondary | ICD-10-CM | POA: Diagnosis not present

## 2021-04-28 ENCOUNTER — Inpatient Hospital Stay (HOSPITAL_BASED_OUTPATIENT_CLINIC_OR_DEPARTMENT_OTHER): Payer: PPO | Admitting: Oncology

## 2021-04-28 ENCOUNTER — Other Ambulatory Visit: Payer: Self-pay

## 2021-04-28 ENCOUNTER — Inpatient Hospital Stay: Payer: PPO | Admitting: Pharmacist

## 2021-04-28 ENCOUNTER — Inpatient Hospital Stay (HOSPITAL_BASED_OUTPATIENT_CLINIC_OR_DEPARTMENT_OTHER): Payer: PPO | Admitting: Hospice and Palliative Medicine

## 2021-04-28 DIAGNOSIS — C9000 Multiple myeloma not having achieved remission: Secondary | ICD-10-CM | POA: Diagnosis not present

## 2021-04-28 DIAGNOSIS — G893 Neoplasm related pain (acute) (chronic): Secondary | ICD-10-CM

## 2021-04-28 DIAGNOSIS — Z515 Encounter for palliative care: Secondary | ICD-10-CM

## 2021-04-28 NOTE — Progress Notes (Signed)
Jack [name]  Telephone:([phone] [phone]  Patient Care Team: [name], MD as PCP - General (Family Medicine) End, [name], MD as PCP - Cardiology (Cardiology) [name], MD (Endocrinology)   Name of the patient: Jack [name]  [id number]  27-Oct-1932   Date of visit: 04/28/21  Virtual visit: I connected with  Jack [name] on 04/28/21 at  2:45 PM EDT by a video enabled telemedicine application and verified that I am speaking with the correct person using two identifiers. I discussed the limitations of evaluation and management by telemedicine and the availability of in person appointments. The patient expressed understanding and agreed to proceed. Locations: Patient: home , Provider: in office  HPI: Patient is a 87 y.o. male with newly diagnosed multiple myeloma. Currently treated with Revlimid (lenalidomide) and dexamethasone. An all Jack regimen was needed for Jack [name] because currently due to his disease his is unable to come to clinic for treatment.  Reason for Consult: Jack chemotherapy follow-up for lenalidomide therapy.   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[place])  03/12/2021 Initial Diagnosis   Kappa light chain myeloma ([place])   03/27/2021 -  Chemotherapy    Patient is on Treatment Plan: MYELOMA NON-TRANSPLANT CANDIDATES VRD SQ Q21D        04/16/2021 Cancer Staging   Staging form: Plasma Cell Myeloma and Plasma Cell Disorders, AJCC 8th Edition - Clinical stage  from 04/16/2021: Albumin (g/dL): 3.8, ISS: Stage II, High-risk cytogenetics: Absent, LDH: Unknown - Signed by Lloyd Huger, MD on 04/16/2021 Albumin range (g/dL): Greater than or equal to 3.5 Cytogenetics: t(11;14) translocation Serum calcium level: Normal Serum creatinine level: Normal Bone disease on imaging: Present     ALLERGIES:  is allergic to quinolones, amlodipine, azithromycin, lisinopril, and morphine and related.  MEDICATIONS:  Current Outpatient Medications  Medication Sig Dispense Refill   apixaban (ELIQUIS) 5 MG TABS tablet Take 1 tablet (5 mg total) by mouth 2 (two) times daily. Increase dose from 2.5 mg. 180 tablet 0   atorvastatin (LIPITOR) 40 MG tablet Take 1 tablet (40 mg total) by mouth daily at 6 PM. 90 tablet 3   baclofen (LIORESAL) 10 MG tablet Take 1 tablet (10 mg total) by mouth 3 (three) times daily. 30 each 2   calcitonin, salmon, (MIACALCIN/FORTICAL) 200 UNIT/ACT nasal spray Place 1 spray into alternate nostrils daily. 3.7 mL 12   carvedilol (COREG) 25 MG tablet Take 1 tablet (25 mg total) by mouth 2 (two) times daily with a meal. 60 tablet 1   dexamethasone (DECADRON) 4 MG tablet Take 5 tablets (20 mg total) by mouth once a week. 20 tablet 4   docusate sodium (COLACE) 100 MG capsule Take 1 capsule (100 mg total) by mouth 2 (two) times daily. 60 capsule 0   feeding supplement (ENSURE ENLIVE / ENSURE PLUS) LIQD Take 237 mLs by mouth 3 (three) times daily between meals. 237 mL 12   gabapentin (NEURONTIN) 300 MG capsule  Take 2 capsules (600 mg total) by mouth 3 (three) times daily. Can give equivalent if cheaper. 180 capsule 2   lenalidomide (REVLIMID) 25 MG capsule Take 1 capsule (25 mg total) by mouth daily. Take for 21 days, then hold for 7 days. Repeat every 28 days. 21 capsule 0   lidocaine (XYLOCAINE) 5 % ointment Apply topically 3 (three) times daily as needed for mild pain or moderate pain. 35.44 g 0   Multiple Vitamin (MULTIVITAMIN WITH MINERALS) TABS  tablet Take 1 tablet by mouth daily.     Multiple Vitamins-Minerals (PRESERVISION AREDS PO) Take by mouth.     naloxone (NARCAN) nasal spray 4 mg/0.1 mL SPRAY 1 SPRAY INTO ONE NOSTRIL AS DIRECTED FOR OPIOID OVERDOSE (TURN PERSON ON SIDE AFTER DOSE. IF NO RESPONSE IN 2-3 MINUTES OR PERSON RESPONDS BUT RELAPSES, REPEAT USING A NEW SPRAY DEVICE AND SPRAY INTO THE OTHER NOSTRIL. CALL 911 AFTER USE.) * EMERGENCY USE ONLY * (Patient not taking: No sig reported) 1 each 0   ondansetron (ZOFRAN) 8 MG tablet Take 1 tablet (8 mg total) by mouth every 8 (eight) hours as needed for nausea or vomiting. 60 tablet 2   oxyCODONE (OXYCONTIN) 10 mg 12 hr tablet Take 2 tablets (20 mg total) by mouth every 12 (twelve) hours. 60 tablet 0   oxyCODONE-acetaminophen (ROXICET) 5-325 MG/5ML solution Take by mouth every 6 (six) hours as needed for severe pain.     polyethylene glycol (MIRALAX / GLYCOLAX) 17 g packet Take 17 g by mouth 2 (two) times daily.  0   senna-docusate (SENOKOT-S) 8.6-50 MG tablet Take 1 tablet by mouth 2 (two) times daily. 30 tablet 0   tamsulosin (FLOMAX) 0.4 MG CAPS capsule TAKE 1 CAPSULE BY MOUTH EVERY DAY 90 capsule 1   traMADol (ULTRAM) 50 MG tablet Take 50 mg by mouth every 6 (six) hours as needed. (Patient not taking: Reported on 04/28/2021)     valACYclovir (VALTREX) 500 MG tablet Take 1 tablet (500 mg total) by mouth daily. 30 tablet 0   No current facility-administered medications for this visit.    VITAL SIGNS: There were no vitals taken for this visit. There were no vitals filed for this visit.  Estimated body mass index is 23.95 kg/m as calculated from the following:   Height as of 03/27/21: _0  (1.753 m).   Weight as of 04/07/21: 73.6 kg (162 lb 3.2 oz).  LABS: CBC:    Component Value Date/Time   WBC 6.7 04/03/2021 0552   HGB 11.0 (L) 04/03/2021 0552   HGB 11.0 (L) 02/24/2021 1014   HCT 33.7 (L) 04/03/2021 0552   HCT 35.3 (L) 02/24/2021 1014   PLT 122 (L) 04/03/2021 0552    PLT 249 02/24/2021 1014   MCV 96.6 04/03/2021 0552   MCV 96 02/24/2021 1014   MCV 90 01/24/2013 0819   NEUTROABS 5.2 03/26/2021 1549   NEUTROABS 7.4 (H) 01/24/2013 0819   LYMPHSABS 1.4 03/26/2021 1549   LYMPHSABS 1.2 01/24/2013 0819   MONOABS 0.3 03/26/2021 1549   MONOABS 0.3 01/24/2013 0819   EOSABS 0.0 03/26/2021 1549   EOSABS 0.0 01/24/2013 0819   BASOSABS 0.0 03/26/2021 1549   BASOSABS 0.0 01/24/2013 0819   Comprehensive Metabolic Panel:    Component Value Date/Time   NA 138 04/03/2021 0552   NA 136 02/24/2021 1014   NA 138 01/24/2013 0819   K 4.7 04/03/2021 0552   K 3.8 01/24/2013 0819   CL 104 04/03/2021 0552   CL  105 01/24/2013 0819   CO2 28 04/03/2021 0552   CO2 25 01/24/2013 0819   BUN 45 (H) 04/03/2021 0552   BUN 23 02/24/2021 1014   BUN 20 (H) 01/24/2013 0819   CREATININE 0.94 04/03/2021 0552   CREATININE 1.52 (H) 01/24/2013 0819   GLUCOSE 138 (H) 04/03/2021 0552   GLUCOSE 171 (H) 01/24/2013 0819   CALCIUM 9.2 04/03/2021 0552   CALCIUM 10.4 (H) 02/19/2021 1229   AST 41 03/26/2021 1549   AST 18 01/24/2013 0819   ALT 25 03/26/2021 1549   ALT 15 01/24/2013 0819   ALKPHOS 104 03/26/2021 1549   ALKPHOS 74 01/24/2013 0819   BILITOT 1.4 (H) 03/26/2021 1549   BILITOT 0.4 02/24/2021 1014   BILITOT 0.5 01/24/2013 0819   PROT 6.0 (L) 03/26/2021 1549   PROT 6.2 02/24/2021 1014   PROT 6.3 (L) 01/24/2013 0819   ALBUMIN 3.8 03/26/2021 1549   ALBUMIN 4.5 02/24/2021 1014   ALBUMIN 3.6 01/24/2013 0819    Present during today's visit: Patient and his wife Jack [name]  Assessment and Plan-  Continue lenalidomide and dexamethasone   Jack Chemotherapy Side Effect/Intolerance:  No reported rash, N/V, diarrhea, constipation, or fatigue. Jack [name] thinks he is tolerating the treatment well  Jack Chemotherapy Adherence: no reported missed dose, they are using the calendar to mark along as he takes his medication No patient barriers to medication adherence identified.   New  medications: none reported  Medication Access Issues: no issues  Patient expressed understanding and was in agreement with this plan. He also understands that He can call clinic at any time with any questions, concerns, or complaints.   Follow-up plan: f/u in one month  Thank you for allowing me to participate in the care of this very pleasant patient.   Time Total: 10 mins  Visit consisted of counseling and education on dealing with issues of symptom management in the setting of serious and potentially life-threatening illness.Greater than 50%  of this time was spent counseling and coordinating care related to the above assessment and plan.  Signed by: Darl Pikes, PharmD, BCPS, Salley Slaughter, CPP Hematology/Oncology Clinical Pharmacist Practitioner [facility] [phone]  04/28/2021 5:46 PM

## 2021-04-28 NOTE — Progress Notes (Signed)
Virtual Visit via Telephone Note  I connected with Jack [name] on 04/28/21 at  2:15 PM EDT by a video enabled telemedicine application and verified that I am speaking with the correct person using two identifiers.  Location: Patient: Home Provider: Clinic   I discussed the limitations of evaluation and management by telemedicine and the availability of in person appointments. The patient expressed understanding and agreed to proceed.  History of Present Illness: Jack [name] is a 87 y.o. male with multiple medical problems including hypertension, CKD stage IIIb, A. fib on Eliquis, and anemia.  Patient was hospitalized 01/02/2021 to 01/07/2021 with T12 compression fracture with recommendation for conservative management including use of a TLSO brace.  Patient was hospitalized again 01/29/2021 to 02/07/2021 with recurrent back pain and MR showing T12 and new L1 compression fracture.  Patient underwent T12/L1 kyphoplasty on 02/02/2021.  There was also suspicion of a left fifth rib fracture, which occurred while transitioning from the stretcher for his kyphoplasty.  Patient had mild hypercalcemia with SPEP and UPEP concerning for possible myeloma.  Patient was referred to [facility] for work-up.  He was readmitted 03/11/2021 - 03/17/2021 with worsening back pain.  CT revealed new compression fractures of L2 and L3.  Patient underwent kyphoplasty on 6/30.  He was hospitalized again 03/26/2021-04/07/2021 with severe back/flank pain.  MRI revealed T10 pathologic compression fracture.  He was started on treatment for multiple myeloma with dexamethasone/Velcade and XRT to spine.  Palliative care was consulted to help address goals and manage ongoing symptoms  Observations/Objective: I spoke with patient and wife by phone.  Patient is receiving home health and is currently on treatment with Revlimid.  He is homebound and unable to come into the clinic.  Patient was unable to complete XRT.  His performance  status remains quite poor.  He is essentially chair/bedbound.  They have a hospital bed now in the home.  Patient is working with physical therapy and wife is committed to continuing that for now.  We reviewed goals and patient does want to continue treatment for the multiple myeloma for now.  We also discussed the possible future option of hospice in the event that he fails to improve.  Patient and wife are interested in home palliative care.  We will send referral.  Symptomatically, wife feels like patient is somewhat improved.  His pain is currently stable on OxyContin/Percocet/gabapentin.  Assessment and Plan: Multiple myeloma -on treatment with Revlimid/dexamethasone.  Followed by Dr. Grayland Ormond who patient will see virtually later today.  Referral for home palliative care  Neoplasm related pain -continue OxyContin/Percocet/gabapentin.  Case and plan discussed with Dr. Grayland Ormond  Follow Up Instructions: Follow-up MyChart visit 3 to 4 weeks   I discussed the assessment and treatment plan with the patient. The patient was provided an opportunity to ask questions and all were answered. The patient agreed with the plan and demonstrated an understanding of the instructions.   The patient was advised to call back or seek an in-person evaluation if the symptoms worsen or if the condition fails to improve as anticipated.  I provided 15 minutes of non-face-to-face time during this encounter.   Irean Hong, NP

## 2021-04-28 NOTE — Progress Notes (Signed)
Spoke to patients wife. She states the he sleeps a lot, has a lot of pain, and decrease in appetite.

## 2021-04-29 ENCOUNTER — Telehealth: Payer: Self-pay | Admitting: Nurse Practitioner

## 2021-04-29 ENCOUNTER — Encounter: Payer: Self-pay | Admitting: Oncology

## 2021-04-29 ENCOUNTER — Encounter: Payer: Self-pay | Admitting: Internal Medicine

## 2021-04-29 ENCOUNTER — Encounter: Payer: Self-pay | Admitting: Urology

## 2021-04-29 ENCOUNTER — Telehealth: Payer: Self-pay

## 2021-04-29 NOTE — Telephone Encounter (Signed)
Rec'd return call from patient's wife [name], and we discussed the Palliative referral/services and all questions were answered and she was in agreement with beginning services.  I have scheduled an In-home Consult for 04/30/21 @ 9 AM.

## 2021-04-29 NOTE — Telephone Encounter (Signed)
Attempted to contact patient/wife at listed home number to offer to schedule an in-home Palliative Consult, no answer - left message with reason for call along with my name and call back number.

## 2021-04-29 NOTE — Telephone Encounter (Signed)
I also attempted to contact patient on his cell, but no answer - left message requesting a return call.

## 2021-04-29 NOTE — Telephone Encounter (Signed)
[place] to request the lab order for weekly BMP to be changed CMP.  Also confirmed verbal order for OT.

## 2021-04-30 ENCOUNTER — Other Ambulatory Visit: Payer: PPO | Admitting: Nurse Practitioner

## 2021-04-30 ENCOUNTER — Other Ambulatory Visit: Payer: Self-pay

## 2021-04-30 ENCOUNTER — Other Ambulatory Visit: Payer: Self-pay | Admitting: Urology

## 2021-04-30 DIAGNOSIS — Z515 Encounter for palliative care: Secondary | ICD-10-CM | POA: Diagnosis not present

## 2021-04-30 DIAGNOSIS — R5381 Other malaise: Secondary | ICD-10-CM | POA: Diagnosis not present

## 2021-04-30 DIAGNOSIS — C9000 Multiple myeloma not having achieved remission: Secondary | ICD-10-CM

## 2021-04-30 MED ORDER — POTASSIUM CITRATE ER 10 MEQ (1080 MG) PO TBCR: 10.0000 meq | EXTENDED_RELEASE_TABLET | Freq: Three times a day (TID) | ORAL | 6 refills | Status: DC

## 2021-04-30 MED FILL — POTASSIUM CITRATE ER 10 MEQ TB: 90 days supply | Qty: 270 | Fill #0

## 2021-04-30 NOTE — Progress Notes (Signed)
[place] Consult Note Telephone: [phone]  Fax: [phone]   Date of encounter: 04/30/21 There are other unrelated non-urgent complaints, but due to the busy schedule and the amount of time I've already spent with him, time does not permit me to address these routine issues at today's visit. I've requested another appointment to review these additional issues. PATIENT NAME: Jack [name] [address] [place] [address]   [phone] (home)  DOB: Aug 30, 1933 MRN: [id number] PRIMARY CARE PROVIDER:    [name], MD,  [place] [address] [phone]  RESPONSIBLE PARTY:    Contact Information     Name Relation Home Work Mobile   Jack [name],Jack [name] Spouse [phone]  [phone]      I met face to face with patient and family in home. Palliative Care was asked to follow this patient by consultation request of  Dr Grayland Ormond to address advance care planning and complex medical decision making. This is the initial visit.   ASSESSMENT AND PLAN / RECOMMENDATIONS:   Advance Care Planning/Goals of Care: Goals include to maximize quality of life and symptom management. Patient/health care surrogate gave his/her permission to discuss.Our advance care planning conversation included a discussion about:    The value and importance of advance care planning  Experiences with loved ones who have been seriously ill or have died  Exploration of personal, cultural or spiritual beliefs that might influence medical decisions  Exploration of goals of care in the event of a sudden injury or illness  Identification and preparation of a healthcare agent  Review and updating or creation of an  advance directive document . Decision not to resuscitate or to de-escalate disease focused treatments due to poor prognosis. CODE STATUS: DNR  Symptom Management/Plan: 1. Advance Care Planning; DNR; wishes at this time to continue  oral chemotherapy and therapy. Discussed at length Hospice benefit through Medicare, what services are provided, how it works.   2. Goals of Care: Goals include to maximize quality of life and symptom management. Our advance care planning conversation included a discussion about:    The value and importance of advance care planning  Exploration of personal, cultural or spiritual beliefs that might influence medical decisions  Exploration of goals of care in the event of a sudden injury or illness  Identification and preparation of a healthcare agent  Review and updating or creation of an advance directive document.  3. Palliative care encounter; Palliative care encounter; Palliative medicine team will continue to support patient, patient's family, and medical team. Visit consisted of counseling and education dealing with the complex and emotionally intense issues of symptom management and palliative care in the setting of serious and potentially life-threatening illness  4. Debility secondary to multiple myeloma and chronic pain, Reviewed pain regimen; Continue mobility as about though overall continues to decline in the setting of malignancy.   Follow up Palliative Care Visit: Palliative care will continue to follow for complex medical decision making, advance care planning, and clarification of goals. Return 4 weeks or prn.  I spent 80 minutes providing this consultation. More than 50% of the time in this consultation was spent in counseling and care coordination.  PPS: 40%  HOSPICE ELIGIBILITY/DIAGNOSIS: TBD  Chief Complaint: Initial Palliative consult for complex medical decision making  HISTORY OF PRESENT ILLNESS:  JAKEOB TULLIS is a 87 y.o. year old male  with with multiple medical problems including hypertension, CKD stage IIIb, A. fib on Eliquis,  and anemia.  Hospitalized 01/02/2021 to 01/07/2021 with T12 compression fracture with recommendation for conservative management including  use of a TLSO brace. Hospitalized 01/29/2021 to 02/07/2021 with recurrent back pain and MR showing T12 and new L1 compression fracture. Underwent T12/L1 kyphoplasty on 02/02/2021. Suspicion of a left fifth rib fracture, which occurred while transitioning from the stretcher for his kyphoplasty.  Mr. Clingenpeel had mild hypercalcemia with SPEP and UPEP concerning for possible myeloma.  Referred to [place] for work-up with readmission 03/11/2021 - 03/17/2021 with worsening back pain, CT revealed new compression fractures of L2 and L3, underwent kyphoplasty on 6/30. Hospitalized again 03/26/2021-04/07/2021 with severe back/flank pain, MRI revealed T10 pathologic compression fracture, started on treatment for multiple myeloma with dexamethasone/Velcade and XRT to spine. Since he has been unable to go to [facility] due to immobility, homebound status with hospital bed in the home decided no further IV chemotherapy or radiation treatments. Mr Harari continued on on treatment with Revlimid/dexamethasone. I called Mrs Sheeley and confirmed initial pc consult and covid screening negative. I visited Mr and Mrs Stammer in their home. Mr Waren was lying in the recliner, repositioned frequently throughout visit. We talked about purpose of PC visit. We talked about how Mr. Mckellips has been feeling. Mr. Lindholm endorses he is tired. Mrs. Vilar endorses usually morning he is more awake, oriented, then towards lunch he is sleeping most of day, evening, night with evening confusion. Mrs. Centrella endorses cognition is overall declining. We talked about Mr Lovingood functional abilities. Mrs Bisping answered most of the questions ans Mr. Dorsch slept. Mrs. Molden endorses OT has completed their last visit. PT has a few more weeks. Mrs. Swanks endorses Mr. Lesinski is able to get up out of the lift chair, is only able to ambulate with walker. We talked about Mrs. Ninneman having to bath, assist with dressing as Mr. Ramsay becomes tired. We talked about symptoms  of pain which currently is being managed following kyphoplasty and current regimen. Mr. Kushner endorses he is at peace. We talked about declined appetite. We talked about medical goals of care. We talked about code status, Mrs Southwell endorses his DNR was completed by Dr Silvio Pate during [place] [facility]. Confirmed, placed in vynca. We talked about resources including PC SW for resources for home assistance. Mrs. Fenlon endorses she would like to wait before talking with PC SW. We talked about difference between Hospice and PC. We talked at length about Hospice benefit through Medicare with services provided, how the program works. We talked about PMH, disease progression, discussed quality of life vs quantity of days. Discussed Oncology plan. Questions answered about overall decline. Mrs. Everson endorses she would like for Mr. Dietze to complete therapy, continue to try oral chemotherapy until risks outweigh benefits, or benefits of hospice are greater. Ms. Freel asked what happens with a home death. We talked about a home death with and without hospice services what it would look like. We talked role pc in poc. We talked about f/u pc visit, scheduled. Mr. Fiero talked about life review, family history with adult children that assist with needs. Therapeutic listening, emotional support provided. Questions answered. I updated Josh Borders NP of PC visit/discussion. Will f/u with Dr Grayland Ormond for f/u visit  History obtained from review of EMR, discussion with Mrs and  Mr. Solorio.  I reviewed available labs, medications, imaging, studies and related documents from the EMR.  Records reviewed and summarized above.   ROS Full 14 system review  of systems performed and negative with exception of: as per HPI.   Physical Exam: Constitutional: NAD General: frail appearing, thin, debilitated, pale male EYES: lids intact, ENMT:  oral mucous membranes moist CV: S1S2, RRR, no LE edema Pulmonary: decrease bs, no increased work  of breathing, no cough, room air Abdomen: soft and non tender MSK: ambulatory with walker; muscle wasting Skin: warm and dry Neuro:  +severe generalized weakness,  mild cognitive impairment Psych: non-anxious affect, A and O x 3  CURRENT PROBLEM LIST:  Patient Active Problem List   Diagnosis Date Noted   Compression fracture of body of thoracic vertebra (HCC)    Acute hypoxemic respiratory failure (HCC) 03/26/2021   Right flank pain 03/26/2021   Multiple pathological fractures 03/14/2021   Kappa light chain myeloma (HCC)    Back pain 03/11/2021   Atrial fibrillation ([place]) 03/11/2021   Compression fracture of L2 and L3 03/11/2021   Hyperlipidemia    CKD (chronic kidney disease), stage IV (HCC)    Constipation    Palliative care encounter    Actinic keratosis 02/19/2021   Inguinal hernia 02/19/2021   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   Numbness of foot 02/19/2021   Sciatica 02/19/2021   Shoulder pain 02/19/2021   Palliative care patient 02/17/2021   Closed compression fracture of body of L1 vertebra ([place]) 02/07/2021   Protein-calorie malnutrition, severe 01/31/2021   Hypokalemia    Normocytic anemia    Atherosclerosis of aorta (HCC)    Irregular heart rhythm    T12 compression fracture ([place]) 01/02/2021   History of kidney stones    Acute kidney injury superimposed on CKD ([place])    Ureteral stone    Ureteral perforation secondary to stent manipulation 10/16/2017   Abdominal pain 10/16/2017   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/26/2016   Hyperplasia, prostate 02/03/2015   Multinodular goiter 07/03/2014   Thyroid nodule 06/20/2014   Nontoxic uninodular goiter 06/20/2014   Edema 09/24/2013   Encounter for preventive health examination 08/21/2013   Personal history of colonic polyps 08/21/2013   History of venomous snake bite 08/21/2013   Cough 10/02/2012   Prostate hypertrophy    Elevated prostate specific antigen (PSA)    HYPERTRIGLYCERIDEMIA  12/21/2006   Essential hypertension 12/21/2006   GERD 12/21/2006   CALCULUS, KIDNEY 12/21/2006   PAST MEDICAL HISTORY:  Active Ambulatory Problems    Diagnosis Date Noted   HYPERTRIGLYCERIDEMIA 12/21/2006   Essential hypertension 12/21/2006   GERD 12/21/2006   CALCULUS, KIDNEY 12/21/2006   Prostate hypertrophy    Elevated prostate specific antigen (PSA)    Cough 10/02/2012   Encounter for preventive health examination 08/21/2013   Personal history of colonic polyps 08/21/2013   History of venomous snake bite 08/21/2013   Edema 09/24/2013   Thyroid nodule 06/20/2014   Multinodular goiter 07/03/2014   Ureteral perforation secondary to stent manipulation 10/16/2017   Abdominal pain 10/16/2017   T12 compression fracture ([place]) 01/02/2021   History of kidney stones    Acute kidney injury superimposed on CKD (HCC)    Ureteral stone    Hypokalemia    Normocytic anemia    Atherosclerosis of aorta (HCC)    Irregular heart rhythm    Protein-calorie malnutrition, severe 01/31/2021   Closed compression fracture of body of L1 vertebra ([place]) 02/07/2021   Palliative care patient 02/17/2021   Actinic keratosis 02/19/2021   Hyperplasia, prostate 02/03/2015   Inguinal hernia 02/19/2021   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   Nontoxic uninodular goiter  06/20/2014   Numbness of foot 02/19/2021   Sciatica 02/19/2021   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/26/2016   Shoulder pain 02/19/2021   Back pain 03/11/2021   Hyperlipidemia    CKD (chronic kidney disease), stage IV (HCC)    Constipation    Atrial fibrillation ([place]) 03/11/2021   Palliative care encounter    Compression fracture of L2 and L3 03/11/2021   Kappa light chain myeloma (HCC)    Multiple pathological fractures 03/14/2021   Acute hypoxemic respiratory failure ([place]) 03/26/2021   Right flank pain 03/26/2021   Compression fracture of body of thoracic vertebra [facility])    Resolved Ambulatory Problems    Diagnosis  Date Noted   Hypertension    Lumbar compression fracture ([place]) 03/12/2021   Past Medical History:  Diagnosis Date   GERD (gastroesophageal reflux disease)    History of colon polyps 2008   Renal disorder    SOCIAL HX:  Social History   Tobacco Use   Smoking status: Former    Types: Cigarettes    Quit date: 07/05/1965    Years since quitting: 55.8   Smokeless tobacco: Never  Substance Use Topics   Alcohol use: Yes    Alcohol/week: 1.0 standard drink    Types: 1 Standard drinks or equivalent per week    Comment: occassionaly   FAMILY HX:  Family History  Problem Relation Age of Onset   Hypertension Father    Hyperlipidemia Father    Cancer Sister        thyroid - dx in late 20's    reviewed  ALLERGIES:  Allergies  Allergen Reactions   Quinolones     Aortic dilation contraindicates FQ   Amlodipine Other (See Comments)    LE edema   Azithromycin Nausea And Vomiting   Lisinopril Cough   Morphine And Related Nausea And Vomiting     PERTINENT MEDICATIONS:  Outpatient Encounter Medications as of 04/30/2021  Medication Sig   potassium citrate (UROCIT-K) 10 MEQ (1080 MG) SR tablet Take 1 tablet (10 mEq total) by mouth 3 (three) times daily with meals.   apixaban (ELIQUIS) 5 MG TABS tablet Take 1 tablet (5 mg total) by mouth 2 (two) times daily. Increase dose from 2.5 mg.   atorvastatin (LIPITOR) 40 MG tablet Take 1 tablet (40 mg total) by mouth daily at 6 PM.   baclofen (LIORESAL) 10 MG tablet Take 1 tablet (10 mg total) by mouth 3 (three) times daily.   calcitonin, salmon, (MIACALCIN/FORTICAL) 200 UNIT/ACT nasal spray Place 1 spray into alternate nostrils daily.   carvedilol (COREG) 25 MG tablet Take 1 tablet (25 mg total) by mouth 2 (two) times daily with a meal.   dexamethasone (DECADRON) 4 MG tablet Take 5 tablets (20 mg total) by mouth once a week.   docusate sodium (COLACE) 100 MG capsule Take 1 capsule (100 mg total) by mouth 2 (two) times daily.   feeding  supplement (ENSURE ENLIVE / ENSURE PLUS) LIQD Take 237 mLs by mouth 3 (three) times daily between meals.   gabapentin (NEURONTIN) 300 MG capsule Take 2 capsules (600 mg total) by mouth 3 (three) times daily. Can give equivalent if cheaper.   lenalidomide (REVLIMID) 25 MG capsule Take 1 capsule (25 mg total) by mouth daily. Take for 21 days, then hold for 7 days. Repeat every 28 days.   lidocaine (XYLOCAINE) 5 % ointment Apply topically 3 (three) times daily as needed for mild pain or moderate pain.   Multiple Vitamin (MULTIVITAMIN WITH MINERALS) TABS  tablet Take 1 tablet by mouth daily.   Multiple Vitamins-Minerals (PRESERVISION AREDS PO) Take by mouth.   naloxone (NARCAN) nasal spray 4 mg/0.1 mL SPRAY 1 SPRAY INTO ONE NOSTRIL AS DIRECTED FOR OPIOID OVERDOSE (TURN PERSON ON SIDE AFTER DOSE. IF NO RESPONSE IN 2-3 MINUTES OR PERSON RESPONDS BUT RELAPSES, REPEAT USING A NEW SPRAY DEVICE AND SPRAY INTO THE OTHER NOSTRIL. CALL 911 AFTER USE.) * EMERGENCY USE ONLY * (Patient not taking: No sig reported)   ondansetron (ZOFRAN) 8 MG tablet Take 1 tablet (8 mg total) by mouth every 8 (eight) hours as needed for nausea or vomiting.   oxyCODONE (OXYCONTIN) 10 mg 12 hr tablet Take 2 tablets (20 mg total) by mouth every 12 (twelve) hours.   oxyCODONE-acetaminophen (ROXICET) 5-325 MG/5ML solution Take by mouth every 6 (six) hours as needed for severe pain.   polyethylene glycol (MIRALAX / GLYCOLAX) 17 g packet Take 17 g by mouth 2 (two) times daily.   senna-docusate (SENOKOT-S) 8.6-50 MG tablet Take 1 tablet by mouth 2 (two) times daily.   tamsulosin (FLOMAX) 0.4 MG CAPS capsule TAKE 1 CAPSULE BY MOUTH EVERY DAY   traMADol (ULTRAM) 50 MG tablet Take 50 mg by mouth every 6 (six) hours as needed. (Patient not taking: Reported on 04/28/2021)   valACYclovir (VALTREX) 500 MG tablet Take 1 tablet (500 mg total) by mouth daily.   No facility-administered encounter medications on file as of 04/30/2021.  Questions and  concerns were addressed. The patient/family was encouraged to call with questions and/or concerns. My business card was provided. Provided general support and encouragement, no other unmet needs identified  Thank you for the opportunity to participate in the care of Mr. Ericsson.  The palliative care team will continue to follow. Please call our office at [phone] if we can be of additional assistance.   This chart was dictated using voice recognition software.  Despite best efforts to proofread,  errors can occur which can change the documentation meaning.   Fatih Stalvey Z Prim Morace, NP ,   COVID-19 PATIENT SCREENING TOOL Asked and negative response unless otherwise noted:  Have you had symptoms of covid, tested positive or been in contact with someone with symptoms/positive test in the past 5-10 days? NO

## 2021-05-01 ENCOUNTER — Encounter: Payer: Self-pay | Admitting: Oncology

## 2021-05-01 ENCOUNTER — Other Ambulatory Visit: Payer: Self-pay

## 2021-05-01 DIAGNOSIS — C9000 Multiple myeloma not having achieved remission: Secondary | ICD-10-CM

## 2021-05-01 MED ORDER — VALACYCLOVIR HCL 500 MG PO TABS: 500.0000 mg | ORAL_TABLET | Freq: Every day | ORAL | 0 refills | Status: DC

## 2021-05-01 MED ORDER — CARVEDILOL 25 MG PO TABS: 25.0000 mg | ORAL_TABLET | Freq: Two times a day (BID) | ORAL | 1 refills | Status: DC

## 2021-05-01 MED FILL — VALACYCLOVIR HCL 500 MG TABLET: 30 days supply | Qty: 30 | Fill #0

## 2021-05-01 MED FILL — CARVEDILOL 25 MG TABLET: 30 days supply | Qty: 60 | Fill #1

## 2021-05-01 NOTE — Telephone Encounter (Signed)
[facility] has not filled the requested medications.  Refill request forwarded to Josh B and Dr. Grayland Ormond.

## 2021-05-03 ENCOUNTER — Encounter: Payer: Self-pay | Admitting: Oncology

## 2021-05-04 ENCOUNTER — Encounter: Payer: Self-pay | Admitting: Oncology

## 2021-05-04 DIAGNOSIS — C9000 Multiple myeloma not having achieved remission: Secondary | ICD-10-CM | POA: Diagnosis not present

## 2021-05-05 ENCOUNTER — Other Ambulatory Visit: Payer: Self-pay | Admitting: Hospice and Palliative Medicine

## 2021-05-05 ENCOUNTER — Encounter: Payer: Self-pay | Admitting: Oncology

## 2021-05-05 DIAGNOSIS — S22080S Wedge compression fracture of T11-T12 vertebra, sequela: Secondary | ICD-10-CM | POA: Diagnosis not present

## 2021-05-05 DIAGNOSIS — C9000 Multiple myeloma not having achieved remission: Secondary | ICD-10-CM | POA: Diagnosis not present

## 2021-05-05 MED ORDER — BACLOFEN 10 MG PO TABS: 10.0000 mg | ORAL_TABLET | Freq: Three times a day (TID) | ORAL | 0 refills | Status: DC

## 2021-05-05 MED FILL — BACLOFEN 10 MG TABLET: 30 days supply | Qty: 90 | Fill #0

## 2021-05-05 MED FILL — GABAPENTIN 300 MG CAPSULE: 30 days supply | Qty: 180 | Fill #1

## 2021-05-06 ENCOUNTER — Encounter: Payer: Self-pay | Admitting: Oncology

## 2021-05-06 ENCOUNTER — Other Ambulatory Visit: Payer: Self-pay | Admitting: Hospice and Palliative Medicine

## 2021-05-06 MED ORDER — OXYCODONE HCL ER 10 MG PO T12A: 20.0000 mg | EXTENDED_RELEASE_TABLET | Freq: Two times a day (BID) | ORAL | 0 refills | Status: DC

## 2021-05-08 ENCOUNTER — Encounter: Payer: Self-pay | Admitting: Hospice and Palliative Medicine

## 2021-05-08 ENCOUNTER — Other Ambulatory Visit: Payer: Self-pay | Admitting: Hospice and Palliative Medicine

## 2021-05-08 ENCOUNTER — Encounter: Payer: Self-pay | Admitting: Oncology

## 2021-05-08 ENCOUNTER — Encounter: Payer: Self-pay | Admitting: Pharmacist

## 2021-05-09 ENCOUNTER — Other Ambulatory Visit: Payer: Self-pay | Admitting: Oncology

## 2021-05-09 MED ORDER — OXYCODONE HCL 5 MG PO TABS: 5.0000 mg | ORAL_TABLET | ORAL | 0 refills | Status: DC | PRN

## 2021-05-09 MED ORDER — OXYCODONE HCL ER 10 MG PO T12A: 20.0000 mg | EXTENDED_RELEASE_TABLET | Freq: Three times a day (TID) | ORAL | 0 refills | Status: DC

## 2021-05-09 MED FILL — OXYCODONE HCL (IR) 5 MG TABLET: 15 days supply | Qty: 90 | Fill #0

## 2021-05-10 DIAGNOSIS — J9601 Acute respiratory failure with hypoxia: Secondary | ICD-10-CM | POA: Diagnosis not present

## 2021-05-10 DIAGNOSIS — M8440XA Pathological fracture, unspecified site, initial encounter for fracture: Secondary | ICD-10-CM | POA: Diagnosis not present

## 2021-05-10 DIAGNOSIS — C9000 Multiple myeloma not having achieved remission: Secondary | ICD-10-CM | POA: Diagnosis not present

## 2021-05-10 DIAGNOSIS — S22000A Wedge compression fracture of unspecified thoracic vertebra, initial encounter for closed fracture: Secondary | ICD-10-CM | POA: Diagnosis not present

## 2021-05-11 ENCOUNTER — Other Ambulatory Visit: Payer: Self-pay | Admitting: Hospice and Palliative Medicine

## 2021-05-11 ENCOUNTER — Encounter: Payer: Self-pay | Admitting: Oncology

## 2021-05-11 ENCOUNTER — Telehealth: Payer: Self-pay

## 2021-05-11 ENCOUNTER — Other Ambulatory Visit: Payer: Self-pay | Admitting: *Deleted

## 2021-05-11 DIAGNOSIS — C9000 Multiple myeloma not having achieved remission: Secondary | ICD-10-CM | POA: Diagnosis not present

## 2021-05-11 MED ORDER — OXYCODONE HCL ER 20 MG PO T12A: 20.0000 mg | EXTENDED_RELEASE_TABLET | Freq: Two times a day (BID) | ORAL | 0 refills | Status: DC

## 2021-05-11 MED ORDER — LENALIDOMIDE 25 MG PO CAPS: 25.0000 mg | ORAL_CAPSULE | Freq: Every day | ORAL | 0 refills | Status: DC

## 2021-05-11 MED FILL — OXYCONTIN ER 20 MG TABLET: 30 days supply | Qty: 60 | Fill #0

## 2021-05-11 NOTE — Progress Notes (Signed)
I spoke with patient's wife.  She talked with Dr. Grayland Ormond over the weekend and was able to take up the prescription for oxycodone but the pharmacy has refused to see old OxyContin.  Wife reports that the combo of OxyContin 20 mg (two 10 mg tablets) twice daily with oxycodone as needed for breakthrough pain has been quite effective in controlling patient's pain.  As patient is requiring two 10mg  OxyContin tablets twice daily, will switch to 20 mg tablets and send a new prescription to pharmacy.  Plan: OxyContin 20mg  Q12H #60 PDMP reviewed

## 2021-05-11 NOTE — Telephone Encounter (Addendum)
Prior authorization for quantity exception on Oxycodone 10 mg, take 2 tablets every 8 hours #120 submitted online and confirmed via phone with representative that request is submitted and pending.    [email or web address] (P: [phone])  Prior Auth (EOC) ID:  [id number]

## 2021-05-11 NOTE — Telephone Encounter (Signed)
I spoke with patient's wife.  She talked with Dr. Grayland Ormond over the weekend and was able to take up the prescription for oxycodone but the pharmacy has refused to see old OxyContin.  Wife reports that the combo of OxyContin 20 mg (two 10 mg tablets) twice daily with oxycodone as needed for breakthrough pain has been quite effective in controlling patient's pain.  As patient is requiring two 10mg  OxyContin tablets twice daily, will switch to 20 mg tablets and send a new prescription to pharmacy.

## 2021-05-11 NOTE — Telephone Encounter (Signed)
Prior authorization for quantity of 180 for 30 days has been approved.  Notification faxed to [facility] pharmacy.

## 2021-05-11 NOTE — Telephone Encounter (Signed)
Josh. NP responded to this patient request

## 2021-05-13 ENCOUNTER — Encounter: Payer: Self-pay | Admitting: Oncology

## 2021-05-13 ENCOUNTER — Encounter: Payer: Self-pay | Admitting: Pharmacist

## 2021-05-13 MED FILL — DEXAMETHASONE 4 MG TABLET: 27 days supply | Qty: 20 | Fill #1

## 2021-05-15 ENCOUNTER — Encounter: Payer: Self-pay | Admitting: Radiation Oncology

## 2021-05-21 ENCOUNTER — Encounter: Payer: Self-pay | Admitting: Hospice and Palliative Medicine

## 2021-05-24 ENCOUNTER — Encounter: Payer: Self-pay | Admitting: Oncology

## 2021-05-24 ENCOUNTER — Other Ambulatory Visit: Payer: Self-pay | Admitting: Oncology

## 2021-05-24 DIAGNOSIS — C9000 Multiple myeloma not having achieved remission: Secondary | ICD-10-CM

## 2021-05-25 ENCOUNTER — Encounter: Payer: Self-pay | Admitting: Oncology

## 2021-05-25 ENCOUNTER — Ambulatory Visit: Payer: PPO | Admitting: Radiation Oncology

## 2021-05-25 ENCOUNTER — Encounter: Payer: Self-pay | Admitting: Internal Medicine

## 2021-05-25 DIAGNOSIS — C9000 Multiple myeloma not having achieved remission: Secondary | ICD-10-CM | POA: Diagnosis not present

## 2021-05-25 MED FILL — VALACYCLOVIR HCL 500 MG TABLET: 30 days supply | Qty: 30 | Fill #0

## 2021-05-27 ENCOUNTER — Encounter: Payer: Self-pay | Admitting: Oncology

## 2021-05-27 ENCOUNTER — Other Ambulatory Visit: Payer: Self-pay | Admitting: *Deleted

## 2021-05-27 MED ORDER — BACLOFEN 10 MG PO TABS: 10.0000 mg | ORAL_TABLET | Freq: Three times a day (TID) | ORAL | 0 refills | Status: DC

## 2021-05-28 MED FILL — BACLOFEN 10 MG TABLET: 30 days supply | Qty: 90 | Fill #0

## 2021-05-29 ENCOUNTER — Encounter: Payer: Self-pay | Admitting: Oncology

## 2021-05-31 NOTE — Progress Notes (Signed)
[place]  Telephone:(336) [phone] Fax:(336) [phone]  ID: Jack [name] OB: 1933/04/18  MR#: [id number]  AOZ#:[id number]  Patient Care Team: [name], MD as PCP - General (Family Medicine) End, [name], MD as PCP - Cardiology (Cardiology) [name], MD (Endocrinology)   I connected with Jack [name] on 06/02/21 at  2:45 PM EDT by video enabled telemedicine visit and verified that I am speaking with the correct person using two identifiers.   I discussed the limitations, risks, security and privacy concerns of performing an evaluation and management service by telemedicine and the availability of in-person appointments. I also discussed with the patient that there may be a patient responsible charge related to this service. The patient expressed understanding and agreed to proceed.   Other persons participating in the visit and their role in the encounter: Patient, MD.  Patient's location: Home. Provider's location: Clinic.  CHIEF COMPLAINT: Stage II kappa chain myeloma.  INTERVAL HISTORY: Patient agreed to video assisted telemedicine visit for further evaluation and continuation of treatment.  Secondary to technical difficulties, visit was transitioned to phone.  Although patient continues to have significant weakness and fatigue, his mobility and pain have improved.  He also has an improved performance status.  He is tolerating Revlimid without significant side effects.  He has no neurologic complaints.  He denies any recent fevers.  His appetite has improved.  He has no chest pain, shortness of breath, cough, or hemoptysis.  He denies any nausea, vomiting, constipation, or diarrhea.  He has no urinary complaints.  Patient offers no further specific complaints today.  REVIEW OF SYSTEMS:   Review of Systems  Constitutional:  Positive for malaise/fatigue. Negative for fever.  Respiratory: Negative.  Negative for cough, hemoptysis and  shortness of breath.   Cardiovascular: Negative.  Negative for chest pain and leg swelling.  Gastrointestinal: Negative.  Negative for abdominal pain.  Genitourinary: Negative.  Negative for dysuria.  Musculoskeletal:  Positive for back pain and joint pain.  Skin: Negative.  Negative for rash.  Neurological:  Positive for weakness. Negative for dizziness, focal weakness and headaches.  Psychiatric/Behavioral: Negative.  The patient is not nervous/anxious.    As per HPI. Otherwise, a complete review of systems is negative.  PAST MEDICAL HISTORY: Past Medical History:  Diagnosis Date   Elevated prostate specific antigen (PSA)    has been 7 for a year    GERD (gastroesophageal reflux disease)    History of colon polyps 2008   [facility],    History of kidney stones    Hyperlipidemia    Hypertension    Prostate hypertrophy    diagnosed at age 87 due to hematospermia   Renal disorder     PAST SURGICAL HISTORY: Past Surgical History:  Procedure Laterality Date   CATARACT EXTRACTION W/PHACO Left 4/85/2019   Procedure: CATARACT EXTRACTION PHACO AND INTRAOCULAR LENS PLACEMENT ([place]);  Surgeon: Birder Robson, MD;  Location: [facility] ORS;  Service: Ophthalmology;  Laterality: Left;  [name] 00:24.8 AP% 14.9 CDE 3.68 Fluid pack lot # [id number]   CATARACT EXTRACTION W/PHACO Right 5/85/2019   Procedure: CATARACT EXTRACTION PHACO AND INTRAOCULAR LENS PLACEMENT (IOC);  Surgeon: Birder Robson, MD;  Location: [facility];  Service: Ophthalmology;  Laterality: Right;  [name] 00:42 AP% 10.8 CDE 4.59 Fluid pack lot # [id number]   COLON SURGERY     CYSTOSCOPY W/ URETERAL STENT PLACEMENT Right 10/16/2017   Procedure: right  URETERAL STENT PLACEMENT,cystoscopy bilateral stent removal,rretrograde;  Surgeon: Abbie Sons,  MD;  Location: [facility];  Service: Urology;  Laterality: Right;   CYSTOSCOPY/URETEROSCOPY/HOLMIUM LASER/STENT PLACEMENT Right 12/16/2020   Procedure: CYSTOSCOPY/URETEROSCOPY/HOLMIUM  LASER/STENT PLACEMENT;  Surgeon: Abbie Sons, MD;  Location: [facility];  Service: Urology;  Laterality: Right;   EXTRACORPOREAL SHOCK WAVE LITHOTRIPSY Right 12/11/2020   Procedure: EXTRACORPOREAL SHOCK WAVE LITHOTRIPSY (ESWL);  Surgeon: Abbie Sons, MD;  Location: [facility];  Service: Urology;  Laterality: Right;   IR KYPHO EA ADDL LEVEL THORACIC OR LUMBAR  02/02/2021   IR KYPHO LUMBAR INC FX REDUCE BONE BX UNI/BIL CANNULATION INC/IMAGING  02/02/2021   KIDNEY STONE SURGERY     KYPHOPLASTY N/A 03/12/2021   Procedure: Hewitt Shorts, L3;  Surgeon: Hessie Knows, MD;  Location: [facility];  Service: Orthopedics;  Laterality: N/A;   RESECTION SOFT TISSUE TUMOR LEG / ANKLE RADICAL  jan 2009   [facility],  right thigh/knee , nonmalignant   SMALL INTESTINE SURGERY  1946   implaed on picket fence, punctured stomach   TONSILLECTOMY      FAMILY HISTORY: Family History  Problem Relation Age of Onset   Hypertension Father    Hyperlipidemia Father    Cancer Sister        thyroid - dx in late 20's    ADVANCED DIRECTIVES (Y/N):  N  HEALTH MAINTENANCE: Social History   Tobacco Use   Smoking status: Former    Types: Cigarettes    Quit date: 07/05/1965    Years since quitting: 55.9   Smokeless tobacco: Never  Vaping Use   Vaping Use: Never used  Substance Use Topics   Alcohol use: Yes    Alcohol/week: 1.0 standard drink    Types: 1 Standard drinks or equivalent per week    Comment: occassionaly   Drug use: No     Colonoscopy:  PAP:  Bone density:  Lipid panel:  Allergies  Allergen Reactions   Quinolones     Aortic dilation contraindicates FQ   Amlodipine Other (See Comments)    LE edema   Azithromycin Nausea And Vomiting   Lisinopril Cough   Morphine And Related Nausea And Vomiting    Current Outpatient Medications  Medication Sig Dispense Refill   potassium citrate (UROCIT-K) 10 MEQ (1080 MG) SR tablet Take 1 tablet (10 mEq total) by mouth 3 (three) times daily with meals.  90 tablet 6   apixaban (ELIQUIS) 5 MG TABS tablet Take 1 tablet (5 mg total) by mouth 2 (two) times daily. Increase dose from 2.5 mg. 180 tablet 0   atorvastatin (LIPITOR) 40 MG tablet Take 1 tablet (40 mg total) by mouth daily at 6 PM. 90 tablet 3   baclofen (LIORESAL) 10 MG tablet Take 1 tablet (10 mg total) by mouth 3 (three) times daily. 90 each 0   calcitonin, salmon, (MIACALCIN/FORTICAL) 200 UNIT/ACT nasal spray Place 1 spray into alternate nostrils daily. 3.7 mL 12   carvedilol (COREG) 25 MG tablet Take 1 tablet (25 mg total) by mouth 2 (two) times daily with a meal. 60 tablet 1   dexamethasone (DECADRON) 4 MG tablet Take 5 tablets (20 mg total) by mouth once a week. 20 tablet 4   docusate sodium (COLACE) 100 MG capsule Take 1 capsule (100 mg total) by mouth 2 (two) times daily. 60 capsule 0   feeding supplement (ENSURE ENLIVE / ENSURE PLUS) LIQD Take 237 mLs by mouth 3 (three) times daily between meals. 237 mL 12   gabapentin (NEURONTIN) 300 MG capsule Take 2 capsules (600 mg total)  by mouth 3 (three) times daily. Can give equivalent if cheaper. 180 capsule 2   lenalidomide (REVLIMID) 25 MG capsule Take 1 capsule (25 mg total) by mouth daily. Take for 21 days, then hold for 7 days. Repeat every 28 days. 21 capsule 0   lidocaine (XYLOCAINE) 5 % ointment Apply topically 3 (three) times daily as needed for mild pain or moderate pain. 35.44 g 0   Multiple Vitamin (MULTIVITAMIN WITH MINERALS) TABS tablet Take 1 tablet by mouth daily.     Multiple Vitamins-Minerals (PRESERVISION AREDS PO) Take by mouth.     naloxone (NARCAN) nasal spray 4 mg/0.1 mL SPRAY 1 SPRAY INTO ONE NOSTRIL AS DIRECTED FOR OPIOID OVERDOSE (TURN PERSON ON SIDE AFTER DOSE. IF NO RESPONSE IN 2-3 MINUTES OR PERSON RESPONDS BUT RELAPSES, REPEAT USING A NEW SPRAY DEVICE AND SPRAY INTO THE OTHER NOSTRIL. CALL 911 AFTER USE.) * EMERGENCY USE ONLY * (Patient not taking: No sig reported) 1 each 0   ondansetron (ZOFRAN) 8 MG tablet Take 1  tablet (8 mg total) by mouth every 8 (eight) hours as needed for nausea or vomiting. 60 tablet 2   oxyCODONE (OXY IR/ROXICODONE) 5 MG immediate release tablet Take 1 tablet (5 mg total) by mouth every 4 (four) hours as needed for severe pain. 90 tablet 0   oxyCODONE (OXYCONTIN) 20 mg 12 hr tablet Take 1 tablet (20 mg total) by mouth every 12 (twelve) hours. 60 tablet 0   polyethylene glycol (MIRALAX / GLYCOLAX) 17 g packet Take 17 g by mouth 2 (two) times daily.  0   senna-docusate (SENOKOT-S) 8.6-50 MG tablet Take 1 tablet by mouth 2 (two) times daily. 30 tablet 0   tamsulosin (FLOMAX) 0.4 MG CAPS capsule TAKE 1 CAPSULE BY MOUTH EVERY DAY 90 capsule 1   valACYclovir (VALTREX) 500 MG tablet TAKE 1 TABLET (500 MG TOTAL) BY MOUTH DAILY. 30 tablet 0   No current facility-administered medications for this visit.    OBJECTIVE: There were no vitals filed for this visit.    There is no height or weight on file to calculate BMI.    ECOG FS:2 - Symptomatic, <50% confined to bed  General: Well-developed, well-nourished, no acute distress. HEENT: Normocephalic. Neuro: Alert, answering all questions appropriately. Cranial nerves grossly intact. Psych: Normal affect.   LAB RESULTS:  Lab Results  Component Value Date   NA 138 04/03/2021   K 4.7 04/03/2021   CL 104 04/03/2021   CO2 28 04/03/2021   GLUCOSE 138 (H) 04/03/2021   BUN 45 (H) 04/03/2021   CREATININE 0.94 04/03/2021   CALCIUM 9.2 04/03/2021   PROT 6.0 (L) 03/26/2021   ALBUMIN 3.8 03/26/2021   AST 41 03/26/2021   ALT 25 03/26/2021   ALKPHOS 104 03/26/2021   BILITOT 1.4 (H) 03/26/2021   GFRNONAA >60 04/03/2021   GFRAA 29 (L) 10/16/2017    Lab Results  Component Value Date   WBC 6.7 04/03/2021   NEUTROABS 5.2 03/26/2021   HGB 11.0 (L) 04/03/2021   HCT 33.7 (L) 04/03/2021   MCV 96.6 04/03/2021   PLT 122 (L) 04/03/2021     STUDIES: No results found.  ASSESSMENT: Stage II Kappa chain myeloma.  PLAN:    Stage II kappa  chain myeloma: (11:14 translocation, high risk) SPEP essentially negative and immunoglobulins are within normal limits.  Patient's kappa light chains have improved to greater than 5400 down to 123.8.  Cycle 1 included Velcade and Revlimid, but given his declining performance status and difficulty  to get into clinic this was transitioned to Revlimid only.  He is currently taking Revlimid 25 mg daily for 21 days, with 7 days off.  He is also taking 20 mg dexamethasone weekly.  Patient is on day 15 of cycle 2 of treatment.  Continue weekly laboratory work.  Patient return to clinic in 2 weeks for further evaluation prior to initiation of cycle 3 at which point he can be transitioned to evaluation once per month.  Appreciate palliative care and clinical pharmacy input.   Pain: Improving.  Continue current narcotic regimen and gabapentin.  Anemia: Chronic and unchanged.  Continue weekly labs with home health.  I provided 30 minutes of face-to-face video visit time during this encounter which included chart review, counseling, and coordination of care as documented above.    Patient expressed understanding and was in agreement with this plan. He also understands that He can call clinic at any time with any questions, concerns, or complaints.   Cancer Staging Kappa light chain myeloma ([place]) Staging form: Plasma Cell Myeloma and Plasma Cell Disorders, AJCC 8th Edition - Clinical stage from 04/16/2021: Albumin (g/dL): 3.8, ISS: Stage II, High-risk cytogenetics: Absent, LDH: Unknown - Signed by Lloyd Huger, MD on 04/16/2021 Albumin range (g/dL): Greater than or equal to 3.5 Cytogenetics: t(11;14) translocation Serum calcium level: Normal Serum creatinine level: Normal Bone disease on imaging: Present  Lloyd Huger, MD   06/02/2021 5:04 PM

## 2021-06-01 ENCOUNTER — Inpatient Hospital Stay: Payer: PPO | Attending: Hospice and Palliative Medicine | Admitting: Hospice and Palliative Medicine

## 2021-06-01 DIAGNOSIS — C9000 Multiple myeloma not having achieved remission: Secondary | ICD-10-CM | POA: Diagnosis not present

## 2021-06-01 MED ORDER — LIDOCAINE 5 % EX OINT: TOPICAL_OINTMENT | Freq: Three times a day (TID) | CUTANEOUS | 0 refills | Status: DC | PRN

## 2021-06-01 MED ORDER — OXYCODONE HCL 5 MG PO TABS: 5.0000 mg | ORAL_TABLET | ORAL | 0 refills | Status: DC | PRN

## 2021-06-01 MED FILL — LIDOCAINE 5% OINTMENT: 30 days supply | Fill #0

## 2021-06-01 MED FILL — OXYCODONE HCL (IR) 5 MG TABLET: 15 days supply | Qty: 90 | Fill #0

## 2021-06-01 MED FILL — GABAPENTIN 300 MG CAPSULE: 30 days supply | Qty: 180 | Fill #2

## 2021-06-01 NOTE — Progress Notes (Signed)
[facility] [place]  [phone] Fax:(336) [phone]  Patient Care Team: [name], MD as PCP - General (Family Medicine) End, [name], MD as PCP - Cardiology (Cardiology) [name], MD (Endocrinology)   Name of the patient: Jack [name]  [id number]  1933-06-15   Date of visit: 06/01/21  Virtual visit: I connected with  Jack [name] on 06/01/21 at  2:15 PM EDT by a video enabled telemedicine application and verified that I am speaking with the correct person using two identifiers. I discussed the limitations of evaluation and management by telemedicine and the availability of in person appointments. The patient expressed understanding and agreed to proceed. Locations: Patient: home , Provider: in office  HPI: Patient is a 87 y.o. male with newly diagnosed multiple myeloma. Currently treated with Revlimid (lenalidomide) and dexamethasone. An all oral regimen was needed for Jack [name] because currently due to his disease his is unable to come to clinic for treatment.  Reason for Consult: Oral chemotherapy follow-up for lenalidomide therapy.   PAST MEDICAL HISTORY: Past Medical History:  Diagnosis Date   Elevated prostate specific antigen (PSA)    has been 7 for a year    GERD (gastroesophageal reflux disease)    History of colon polyps 2008   [facility],    History of kidney stones    Hyperlipidemia    Hypertension    Prostate hypertrophy    diagnosed at age 60 due to hematospermia   Renal disorder     HEMATOLOGY/ONCOLOGY HISTORY:  Oncology History  Kappa light chain myeloma ([place])  03/12/2021 Initial Diagnosis   Kappa light chain myeloma ([place])   03/27/2021 -  Chemotherapy    Patient is on Treatment Plan: MYELOMA NON-TRANSPLANT CANDIDATES VRD SQ Q21D        04/16/2021 Cancer Staging   Staging form: Plasma Cell Myeloma and Plasma Cell Disorders, AJCC 8th Edition - Clinical stage  from 04/16/2021: Albumin (g/dL): 3.8, ISS: Stage II, High-risk cytogenetics: Absent, LDH: Unknown - Signed by Lloyd Huger, MD on 04/16/2021 Albumin range (g/dL): Greater than or equal to 3.5 Cytogenetics: t(11;14) translocation Serum calcium level: Normal Serum creatinine level: Normal Bone disease on imaging: Present     ALLERGIES:  is allergic to quinolones, amlodipine, azithromycin, lisinopril, and morphine and related.  MEDICATIONS:  Current Outpatient Medications  Medication Sig Dispense Refill   potassium citrate (UROCIT-K) 10 MEQ (1080 MG) SR tablet Take 1 tablet (10 mEq total) by mouth 3 (three) times daily with meals. 90 tablet 6   apixaban (ELIQUIS) 5 MG TABS tablet Take 1 tablet (5 mg total) by mouth 2 (two) times daily. Increase dose from 2.5 mg. 180 tablet 0   atorvastatin (LIPITOR) 40 MG tablet Take 1 tablet (40 mg total) by mouth daily at 6 PM. 90 tablet 3   baclofen (LIORESAL) 10 MG tablet Take 1 tablet (10 mg total) by mouth 3 (three) times daily. 90 each 0   calcitonin, salmon, (MIACALCIN/FORTICAL) 200 UNIT/ACT nasal spray Place 1 spray into alternate nostrils daily. 3.7 mL 12   carvedilol (COREG) 25 MG tablet Take 1 tablet (25 mg total) by mouth 2 (two) times daily with a meal. 60 tablet 1   dexamethasone (DECADRON) 4 MG tablet Take 5 tablets (20 mg total) by mouth once a week. 20 tablet 4   docusate sodium (COLACE) 100 MG capsule Take 1 capsule (100 mg total) by mouth 2 (two) times daily. 60 capsule 0   feeding  supplement (ENSURE ENLIVE / ENSURE PLUS) LIQD Take 237 mLs by mouth 3 (three) times daily between meals. 237 mL 12   gabapentin (NEURONTIN) 300 MG capsule Take 2 capsules (600 mg total) by mouth 3 (three) times daily. Can give equivalent if cheaper. 180 capsule 2   lenalidomide (REVLIMID) 25 MG capsule Take 1 capsule (25 mg total) by mouth daily. Take for 21 days, then hold for 7 days. Repeat every 28 days. 21 capsule 0   lidocaine (XYLOCAINE) 5 % ointment  Apply topically 3 (three) times daily as needed for mild pain or moderate pain. 35.44 g 0   Multiple Vitamin (MULTIVITAMIN WITH MINERALS) TABS tablet Take 1 tablet by mouth daily.     Multiple Vitamins-Minerals (PRESERVISION AREDS PO) Take by mouth.     naloxone (NARCAN) nasal spray 4 mg/0.1 mL SPRAY 1 SPRAY INTO ONE NOSTRIL AS DIRECTED FOR OPIOID OVERDOSE (TURN PERSON ON SIDE AFTER DOSE. IF NO RESPONSE IN 2-3 MINUTES OR PERSON RESPONDS BUT RELAPSES, REPEAT USING A NEW SPRAY DEVICE AND SPRAY INTO THE OTHER NOSTRIL. CALL 911 AFTER USE.) * EMERGENCY USE ONLY * (Patient not taking: No sig reported) 1 each 0   ondansetron (ZOFRAN) 8 MG tablet Take 1 tablet (8 mg total) by mouth every 8 (eight) hours as needed for nausea or vomiting. 60 tablet 2   oxyCODONE (OXY IR/ROXICODONE) 5 MG immediate release tablet Take 1 tablet (5 mg total) by mouth every 4 (four) hours as needed for severe pain. 90 tablet 0   oxyCODONE (OXYCONTIN) 20 mg 12 hr tablet Take 1 tablet (20 mg total) by mouth every 12 (twelve) hours. 60 tablet 0   polyethylene glycol (MIRALAX / GLYCOLAX) 17 g packet Take 17 g by mouth 2 (two) times daily.  0   senna-docusate (SENOKOT-S) 8.6-50 MG tablet Take 1 tablet by mouth 2 (two) times daily. 30 tablet 0   tamsulosin (FLOMAX) 0.4 MG CAPS capsule TAKE 1 CAPSULE BY MOUTH EVERY DAY 90 capsule 1   valACYclovir (VALTREX) 500 MG tablet TAKE 1 TABLET (500 MG TOTAL) BY MOUTH DAILY. 30 tablet 0   No current facility-administered medications for this visit.    VITAL SIGNS: There were no vitals taken for this visit. There were no vitals filed for this visit.  Estimated body mass index is 23.95 kg/m as calculated from the following:   Height as of 03/27/21: '5\' 9"'  (1.753 m).   Weight as of 04/07/21: 73.6 kg (162 lb 3.2 oz).  LABS: CBC:    Component Value Date/Time   WBC 6.7 04/03/2021 0552   HGB 11.0 (L) 04/03/2021 0552   HGB 11.0 (L) 02/24/2021 1014   HCT 33.7 (L) 04/03/2021 0552   HCT 35.3 (L)  02/24/2021 1014   PLT 122 (L) 04/03/2021 0552   PLT 249 02/24/2021 1014   MCV 96.6 04/03/2021 0552   MCV 96 02/24/2021 1014   MCV 90 01/24/2013 0819   NEUTROABS 5.2 03/26/2021 1549   NEUTROABS 7.4 (H) 01/24/2013 0819   LYMPHSABS 1.4 03/26/2021 1549   LYMPHSABS 1.2 01/24/2013 0819   MONOABS 0.3 03/26/2021 1549   MONOABS 0.3 01/24/2013 0819   EOSABS 0.0 03/26/2021 1549   EOSABS 0.0 01/24/2013 0819   BASOSABS 0.0 03/26/2021 1549   BASOSABS 0.0 01/24/2013 0819   Comprehensive Metabolic Panel:    Component Value Date/Time   NA 138 04/03/2021 0552   NA 136 02/24/2021 1014   NA 138 01/24/2013 0819   K 4.7 04/03/2021 0552   K 3.8  01/24/2013 0819   CL 104 04/03/2021 0552   CL 105 01/24/2013 0819   CO2 28 04/03/2021 0552   CO2 25 01/24/2013 0819   BUN 45 (H) 04/03/2021 0552   BUN 23 02/24/2021 1014   BUN 20 (H) 01/24/2013 0819   CREATININE 0.94 04/03/2021 0552   CREATININE 1.52 (H) 01/24/2013 0819   GLUCOSE 138 (H) 04/03/2021 0552   GLUCOSE 171 (H) 01/24/2013 0819   CALCIUM 9.2 04/03/2021 0552   CALCIUM 10.4 (H) 02/19/2021 1229   AST 41 03/26/2021 1549   AST 18 01/24/2013 0819   ALT 25 03/26/2021 1549   ALT 15 01/24/2013 0819   ALKPHOS 104 03/26/2021 1549   ALKPHOS 74 01/24/2013 0819   BILITOT 1.4 (H) 03/26/2021 1549   BILITOT 0.4 02/24/2021 1014   BILITOT 0.5 01/24/2013 0819   PROT 6.0 (L) 03/26/2021 1549   PROT 6.2 02/24/2021 1014   PROT 6.3 (L) 01/24/2013 0819   ALBUMIN 3.8 03/26/2021 1549   ALBUMIN 4.5 02/24/2021 1014   ALBUMIN 3.6 01/24/2013 0819    Present during today's visit: Patient and his wife Jack [name]  Assessment and Plan-  Reviewed CBC/BMP 05/25/21 and light chains from 05/18/21 with Jack [name]. Light chains are significantly improved from last check on 02/19/21.   Continue lenalidomide and dexamethasone   Oral Chemotherapy Side Effect/Intolerance:  No reported rash, N/V, diarrhea, or constipation. Jack [name] thinks he is tolerating the treatment well and overall  much improved from pretreatment. Jack [name] reports feeling fatigue.  Oral Chemotherapy Adherence: no reported missed doses No patient barriers to medication adherence identified.   New medications: none reported  Medication Access Issues: no issues, fills Revlimid at [facility], refill sent in today  Patient expressed understanding and was in agreement with this plan. He also understands that He can call clinic at any time with any questions, concerns, or complaints.   Follow-up plan: RTC in two weeks with start of next cycle  Thank you for allowing me to participate in the care of this very pleasant patient.   Time Total: 15 mins  Visit consisted of counseling and education on dealing with issues of symptom management in the setting of serious and potentially life-threatening illness.Greater than 50%  of this time was spent counseling and coordinating care related to the above assessment and plan.  Signed by: Darl Pikes, PharmD, BCPS, Salley Slaughter, CPP Hematology/Oncology Clinical Pharmacist Practitioner [facility] [phone]  06/01/2021 4:28 PM

## 2021-06-01 NOTE — Progress Notes (Signed)
Virtual Visit via Telephone Note  I connected with Jack [name] on 06/01/21 at  1:00 PM EDT by a video enabled telemedicine application and verified that I am speaking with the correct person using two identifiers.  Location: Patient: Home Provider: Clinic   I discussed the limitations of evaluation and management by telemedicine and the availability of in person appointments. The patient expressed understanding and agreed to proceed.  History of Present Illness: Jack [name] is a 87 y.o. male with multiple medical problems including hypertension, CKD stage IIIb, A. fib on Eliquis, and anemia.  Patient was hospitalized 01/02/2021 to 01/07/2021 with T12 compression fracture with recommendation for conservative management including use of a TLSO brace.  Patient was hospitalized again 01/29/2021 to 02/07/2021 with recurrent back pain and MR showing T12 and new L1 compression fracture.  Patient underwent T12/L1 kyphoplasty on 02/02/2021.  There was also suspicion of a left fifth rib fracture, which occurred while transitioning from the stretcher for his kyphoplasty.  Patient had mild hypercalcemia with SPEP and UPEP concerning for possible myeloma.  Patient was referred to [facility] for work-up.  He was readmitted 03/11/2021 - 03/17/2021 with worsening back pain.  CT revealed new compression fractures of L2 and L3.  Patient underwent kyphoplasty on 6/30.  He was hospitalized again 03/26/2021-04/07/2021 with severe back/flank pain.  MRI revealed T10 pathologic compression fracture.  He was started on treatment for multiple myeloma with dexamethasone/Velcade and XRT to spine.  Palliative care was consulted to help address goals and manage ongoing symptoms  Observations/Objective: I spoke with patient and wife by phone.  They report that patient is doing much better with home health.  He is now ambulating with use of a walker and walking outside at times.  No recent falls.  Pain has been stable on  OxyContin/oxycodone.  Appetite has improved.  No issues with sleep.  No other symptomatic complaints at present.  Assessment and Plan: Multiple myeloma -on treatment with Revlimid/dexamethasone.  Followed by Dr. Grayland Ormond who patient will see virtually later tomorrow.  Neoplasm related pain -continue OxyContin/oxycodone/gabapentin.   Follow Up Instructions: Follow-up MyChart visit 3 to 4 weeks   I discussed the assessment and treatment plan with the patient. The patient was provided an opportunity to ask questions and all were answered. The patient agreed with the plan and demonstrated an understanding of the instructions.   The patient was advised to call back or seek an in-person evaluation if the symptoms worsen or if the condition fails to improve as anticipated.  I provided 6 minutes of non-face-to-face time during this encounter.   Irean Hong, NP

## 2021-06-02 ENCOUNTER — Telehealth: Payer: Self-pay | Admitting: Nurse Practitioner

## 2021-06-02 ENCOUNTER — Inpatient Hospital Stay (HOSPITAL_BASED_OUTPATIENT_CLINIC_OR_DEPARTMENT_OTHER): Payer: PPO | Admitting: Oncology

## 2021-06-02 ENCOUNTER — Inpatient Hospital Stay (HOSPITAL_BASED_OUTPATIENT_CLINIC_OR_DEPARTMENT_OTHER): Payer: PPO | Admitting: Pharmacist

## 2021-06-02 DIAGNOSIS — C9000 Multiple myeloma not having achieved remission: Secondary | ICD-10-CM | POA: Diagnosis not present

## 2021-06-02 MED ORDER — LENALIDOMIDE 25 MG PO CAPS: 25.0000 mg | ORAL_CAPSULE | Freq: Every day | ORAL | 0 refills | Status: DC

## 2021-06-02 NOTE — Telephone Encounter (Signed)
Ret'd call to patient's wife [name] and we have rescheduled the 06/04/21 Palliative f/u visit to 07/07/21 @ 11 AM.

## 2021-06-02 NOTE — Telephone Encounter (Signed)
Palliative NP requested that I call patient's wife to see about possibly rescheduling the Palliative f/u visit on 9/22 due to patient just had a visit yesterday with Billey Chang, NP at the [facility].  Called wife but left a VM requesting a return call to possibly reschedule the f/u visit.

## 2021-06-04 ENCOUNTER — Encounter: Payer: Self-pay | Admitting: Oncology

## 2021-06-05 ENCOUNTER — Encounter: Payer: Self-pay | Admitting: Oncology

## 2021-06-05 DIAGNOSIS — M25561 Pain in right knee: Secondary | ICD-10-CM | POA: Diagnosis not present

## 2021-06-07 ENCOUNTER — Encounter: Payer: Self-pay | Admitting: Hospice and Palliative Medicine

## 2021-06-07 DIAGNOSIS — W19XXXD Unspecified fall, subsequent encounter: Secondary | ICD-10-CM | POA: Diagnosis not present

## 2021-06-07 DIAGNOSIS — M25562 Pain in left knee: Secondary | ICD-10-CM | POA: Diagnosis not present

## 2021-06-07 MED FILL — DEXAMETHASONE 4 MG TABLET: 27 days supply | Qty: 20 | Fill #2

## 2021-06-08 DIAGNOSIS — C9000 Multiple myeloma not having achieved remission: Secondary | ICD-10-CM | POA: Diagnosis not present

## 2021-06-09 ENCOUNTER — Encounter: Payer: Self-pay | Admitting: Oncology

## 2021-06-09 ENCOUNTER — Other Ambulatory Visit: Payer: Self-pay

## 2021-06-09 MED ORDER — OXYCODONE HCL ER 20 MG PO T12A: 20.0000 mg | EXTENDED_RELEASE_TABLET | Freq: Two times a day (BID) | ORAL | 0 refills | Status: DC

## 2021-06-09 NOTE — Telephone Encounter (Signed)
Josh B is not in the office today

## 2021-06-10 ENCOUNTER — Encounter: Payer: Self-pay | Admitting: Oncology

## 2021-06-10 DIAGNOSIS — M8440XA Pathological fracture, unspecified site, initial encounter for fracture: Secondary | ICD-10-CM | POA: Diagnosis not present

## 2021-06-10 DIAGNOSIS — S22000A Wedge compression fracture of unspecified thoracic vertebra, initial encounter for closed fracture: Secondary | ICD-10-CM | POA: Diagnosis not present

## 2021-06-10 DIAGNOSIS — C9000 Multiple myeloma not having achieved remission: Secondary | ICD-10-CM | POA: Diagnosis not present

## 2021-06-10 DIAGNOSIS — J9601 Acute respiratory failure with hypoxia: Secondary | ICD-10-CM | POA: Diagnosis not present

## 2021-06-10 MED FILL — OXYCONTIN ER 20 MG TABLET: 20 days supply | Qty: 40 | Fill #0

## 2021-06-11 ENCOUNTER — Other Ambulatory Visit: Payer: Self-pay

## 2021-06-11 ENCOUNTER — Other Ambulatory Visit: Payer: PPO

## 2021-06-11 DIAGNOSIS — Z87442 Personal history of urinary calculi: Secondary | ICD-10-CM | POA: Diagnosis not present

## 2021-06-11 DIAGNOSIS — Z8739 Personal history of other diseases of the musculoskeletal system and connective tissue: Secondary | ICD-10-CM | POA: Diagnosis not present

## 2021-06-11 DIAGNOSIS — Z9181 History of falling: Secondary | ICD-10-CM | POA: Diagnosis not present

## 2021-06-11 DIAGNOSIS — Z09 Encounter for follow-up examination after completed treatment for conditions other than malignant neoplasm: Secondary | ICD-10-CM | POA: Diagnosis not present

## 2021-06-11 DIAGNOSIS — N201 Calculus of ureter: Secondary | ICD-10-CM

## 2021-06-11 NOTE — Telephone Encounter (Signed)
Confirmed with pharmacy that patient was given #40 on 9/28 and will need a new rx when he is out of medication (the 20 tabs will not be given when available)

## 2021-06-12 LAB — MICROSCOPIC EXAMINATION: Bacteria, UA: NONE SEEN

## 2021-06-12 LAB — URINALYSIS, COMPLETE
Bilirubin, UA: NEGATIVE
Glucose, UA: NEGATIVE
Ketones, UA: NEGATIVE
Leukocytes,UA: NEGATIVE
Nitrite, UA: NEGATIVE
Specific Gravity, UA: 1.02 (ref 1.005–1.030)
Urobilinogen, Ur: 0.2 mg/dL (ref 0.2–1.0)
pH, UA: 5.5 (ref 5.0–7.5)

## 2021-06-15 ENCOUNTER — Encounter: Payer: Self-pay | Admitting: *Deleted

## 2021-06-15 ENCOUNTER — Encounter: Payer: Self-pay | Admitting: Oncology

## 2021-06-15 ENCOUNTER — Telehealth: Payer: Self-pay | Admitting: *Deleted

## 2021-06-15 DIAGNOSIS — I10 Essential (primary) hypertension: Secondary | ICD-10-CM | POA: Diagnosis not present

## 2021-06-15 DIAGNOSIS — Z515 Encounter for palliative care: Secondary | ICD-10-CM | POA: Diagnosis not present

## 2021-06-15 DIAGNOSIS — C9000 Multiple myeloma not having achieved remission: Secondary | ICD-10-CM | POA: Diagnosis not present

## 2021-06-15 DIAGNOSIS — D692 Other nonthrombocytopenic purpura: Secondary | ICD-10-CM | POA: Diagnosis not present

## 2021-06-15 DIAGNOSIS — Z87891 Personal history of nicotine dependence: Secondary | ICD-10-CM | POA: Diagnosis not present

## 2021-06-15 NOTE — Telephone Encounter (Signed)
-----   Message from Abbie Sons, MD sent at 06/12/2021  1:43 PM EDT ----- Urine pH is still in the acid range and if he is tolerating the potassiums citrate would recommend increasing from 1 tab 3 times a day to 2 tabs twice a day

## 2021-06-15 NOTE — Telephone Encounter (Signed)
Left message and sent my chart

## 2021-06-17 ENCOUNTER — Encounter: Payer: Self-pay | Admitting: Oncology

## 2021-06-17 ENCOUNTER — Other Ambulatory Visit: Payer: Self-pay | Admitting: Oncology

## 2021-06-17 DIAGNOSIS — C9000 Multiple myeloma not having achieved remission: Secondary | ICD-10-CM

## 2021-06-17 MED FILL — VALACYCLOVIR HCL 500 MG TABLET: 90 days supply | Qty: 90 | Fill #0

## 2021-06-25 ENCOUNTER — Other Ambulatory Visit: Payer: Self-pay | Admitting: Hospice and Palliative Medicine

## 2021-06-25 ENCOUNTER — Encounter: Payer: Self-pay | Admitting: Oncology

## 2021-06-25 MED ORDER — OXYCODONE HCL ER 20 MG PO T12A: 20.0000 mg | EXTENDED_RELEASE_TABLET | Freq: Two times a day (BID) | ORAL | 0 refills | Status: DC

## 2021-06-25 NOTE — Progress Notes (Signed)
PDMP reviewed. OxyContin refilled per family request. #[phone]

## 2021-06-26 ENCOUNTER — Ambulatory Visit: Payer: PPO | Admitting: Physician Assistant

## 2021-06-27 MED FILL — OXYCODONE HCL ER 20 MG TABLET: 30 days supply | Qty: 60 | Fill #0

## 2021-06-29 NOTE — Progress Notes (Signed)
[facility]  [phone] [phone]  ID: Jack [name] OB: 85/08/1933  MR#: [id number]  KXF#:[id number]  Patient Care Team: [name], MD as PCP - General (Family Medicine) End, [name], MD as PCP - Cardiology (Cardiology) [name], MD (Endocrinology)   I connected with Jack [name] on 85/20/22 at  2:45 PM EDT by video enabled telemedicine visit and verified that I am speaking with the correct person using two identifiers.   I discussed the limitations, risks, security and privacy concerns of performing an evaluation and management service by telemedicine and the availability of in-person appointments. I also discussed with the patient that there may be a patient responsible charge related to this service. The patient expressed understanding and agreed to proceed.   Other persons participating in the visit and their role in the encounter: Patient, MD.  Patient's location: Home. Provider's location: Clinic.   CHIEF COMPLAINT: Stage II kappa chain myeloma.  INTERVAL HISTORY: Patient agreed to video assisted telemedicine visit for further evaluation and continuation of Revlimid.  He continues to improve on a daily basis.  His pain is well controlled and his mobility is better. He is tolerating Revlimid without significant side effects.  He has no neurologic complaints.  He denies any recent fevers.  His appetite has improved.  He has no chest pain, shortness of breath, cough, or hemoptysis.  He denies any nausea, vomiting, constipation, or diarrhea.  He has no urinary complaints.  Patient offers no further specific complaints today.  REVIEW OF SYSTEMS:   Review of Systems  Constitutional:  Positive for malaise/fatigue. Negative for fever.  Respiratory: Negative.  Negative for cough, hemoptysis and shortness of breath.   Cardiovascular: Negative.  Negative for chest pain and leg swelling.  Gastrointestinal: Negative.   Negative for abdominal pain.  Genitourinary: Negative.  Negative for dysuria.  Musculoskeletal:  Positive for back pain and joint pain.  Skin: Negative.  Negative for rash.  Neurological:  Positive for weakness. Negative for dizziness, focal weakness and headaches.  Psychiatric/Behavioral: Negative.  The patient is not nervous/anxious.    As per HPI. Otherwise, a complete review of systems is negative.  PAST MEDICAL HISTORY: Past Medical History:  Diagnosis Date   Elevated prostate specific antigen (PSA)    has been 7 for a year    GERD (gastroesophageal reflux disease)    History of colon polyps 2008   [facility],    History of kidney stones    Hyperlipidemia    Hypertension    Prostate hypertrophy    diagnosed at age 87 due to hematospermia   Renal disorder     PAST SURGICAL HISTORY: Past Surgical History:  Procedure Laterality Date   CATARACT EXTRACTION W/PHACO Left 01/10/2018   Procedure: CATARACT EXTRACTION PHACO AND INTRAOCULAR LENS PLACEMENT ([place]);  Surgeon: Birder Robson, MD;  Location: [facility] OR[facility];  Service: Ophthalmology;  Laterality: Left;  [name] 00:24.8 AP% 14.9 CDE 3.68 Fluid pack lot # [id number]   CATARACT EXTRACTION W/PHACO Right 01/25/2018   Procedure: CATARACT EXTRACTION PHACO AND INTRAOCULAR LENS PLACEMENT ([facility]);  Surgeon: Birder Robson, MD;  Location: [facility];  Service: Ophthalmology;  Laterality: Right;  [name] 00:42 AP% 10.8 CDE 4.59 Fluid pack lot # [id number]   COLON SURGERY     CYSTOSCOPY W/ URETERAL STENT PLACEMENT Right 10/16/2017   Procedure: right  URETERAL STENT PLACEMENT,cystoscopy bilateral stent removal,rretrograde;  Surgeon: Abbie Sons, MD;  Location: [facility];  Service: Urology;  Laterality: Right;   CYSTOSCOPY/URETEROSCOPY/HOLMIUM  LASER/STENT PLACEMENT Right 12/16/2020   Procedure: CYSTOSCOPY/URETEROSCOPY/HOLMIUM LASER/STENT PLACEMENT;  Surgeon: Abbie Sons, MD;  Location: [facility];  Service: Urology;  Laterality: Right;    EXTRACORPOREAL SHOCK WAVE LITHOTRIPSY Right 12/11/2020   Procedure: EXTRACORPOREAL SHOCK WAVE LITHOTRIPSY (ESWL);  Surgeon: Abbie Sons, MD;  Location: [facility];  Service: Urology;  Laterality: Right;   IR KYPHO EA ADDL LEVEL THORACIC OR LUMBAR  02/02/2021   IR KYPHO LUMBAR INC FX REDUCE BONE BX UNI/BIL CANNULATION INC/IMAGING  02/02/2021   KIDNEY STONE SURGERY     KYPHOPLASTY N/A 03/12/2021   Procedure: Hewitt Shorts, L3;  Surgeon: Hessie Knows, MD;  Location: [facility];  Service: Orthopedics;  Laterality: N/A;   RESECTION SOFT TISSUE TUMOR LEG / ANKLE RADICAL  jan 2009   [facility],  right thigh/knee , nonmalignant   SMALL INTESTINE SURGERY  1946   implaed on picket fence, punctured stomach   TONSILLECTOMY      FAMILY HISTORY: Family History  Problem Relation Age of Onset   Hypertension Father    Hyperlipidemia Father    Cancer Sister        thyroid - dx in late 20's    ADVANCED DIRECTIVES (Y/N):  N  HEALTH MAINTENANCE: Social History   Tobacco Use   Smoking status: Former    Types: Cigarettes    Quit date: 07/05/1965    Years since quitting: 56.0   Smokeless tobacco: Never  Vaping Use   Vaping Use: Never used  Substance Use Topics   Alcohol use: Yes    Alcohol/week: 1.0 standard drink    Types: 1 Standard drinks or equivalent per week    Comment: occassionaly   Drug use: No     Colonoscopy:  PAP:  Bone density:  Lipid panel:  Allergies  Allergen Reactions   Quinolones     Aortic dilation contraindicates FQ   Amlodipine Other (See Comments)    LE edema   Azithromycin Nausea And Vomiting   Lisinopril Cough   Morphine And Related Nausea And Vomiting    Current Outpatient Medications  Medication Sig Dispense Refill   apixaban (ELIQUIS) 5 MG TABS tablet Take 1 tablet (5 mg total) by mouth 2 (two) times daily. Increase dose from 2.5 mg. 180 tablet 0   atorvastatin (LIPITOR) 40 MG tablet Take 1 tablet (40 mg total) by mouth daily at 6 PM. 90 tablet 3    calcitonin, salmon, (MIACALCIN/FORTICAL) 200 UNIT/ACT nasal spray Place 1 spray into alternate nostrils daily. 3.7 mL 12   carvedilol (COREG) 25 MG tablet Take 1 tablet (25 mg total) by mouth 2 (two) times daily with a meal. 60 tablet 1   dexamethasone (DECADRON) 4 MG tablet Take 5 tablets (20 mg total) by mouth once a week. 20 tablet 4   docusate sodium (COLACE) 100 MG capsule Take 1 capsule (100 mg total) by mouth 2 (two) times daily. 60 capsule 0   feeding supplement (ENSURE ENLIVE / ENSURE PLUS) LIQD Take 237 mLs by mouth 3 (three) times daily between meals. 237 mL 12   gabapentin (NEURONTIN) 300 MG capsule Take 2 capsules (600 mg total) by mouth 3 (three) times daily. Can give equivalent if cheaper. 180 capsule 2   lenalidomide (REVLIMID) 25 MG capsule Take 1 capsule (25 mg total) by mouth daily. Take for 21 days, then hold for 7 days. Repeat every 28 days. 21 capsule 0   lidocaine (XYLOCAINE) 5 % ointment Apply topically 3 (three) times daily as needed for mild pain  or moderate pain. 35.44 g 0   Multiple Vitamin (MULTIVITAMIN WITH MINERALS) TABS tablet Take 1 tablet by mouth daily.     Multiple Vitamins-Minerals (PRESERVISION AREDS PO) Take by mouth.     naloxone (NARCAN) nasal spray 4 mg/0.1 mL SPRAY 1 SPRAY INTO ONE NOSTRIL AS DIRECTED FOR OPIOID OVERDOSE (TURN PERSON ON SIDE AFTER DOSE. IF NO RESPONSE IN 2-3 MINUTES OR PERSON RESPONDS BUT RELAPSES, REPEAT USING A NEW SPRAY DEVICE AND SPRAY INTO THE OTHER NOSTRIL. CALL 911 AFTER USE.) * EMERGENCY USE ONLY * 1 each 0   ondansetron (ZOFRAN) 8 MG tablet Take 1 tablet (8 mg total) by mouth every 8 (eight) hours as needed for nausea or vomiting. 60 tablet 2   oxyCODONE (OXY IR/ROXICODONE) 5 MG immediate release tablet Take 1 tablet (5 mg total) by mouth every 4 (four) hours as needed for severe pain. 90 tablet 0   oxyCODONE (OXYCONTIN) 20 mg 12 hr tablet Take 1 tablet (20 mg total) by mouth every 12 (twelve) hours. 60 tablet 0   polyethylene glycol  (MIRALAX / GLYCOLAX) 17 g packet Take 17 g by mouth 2 (two) times daily.  0   potassium citrate (UROCIT-K) 10 MEQ (1080 MG) SR tablet Take 1 tablet (10 mEq total) by mouth 3 (three) times daily with meals. 90 tablet 6   senna-docusate (SENOKOT-S) 8.6-50 MG tablet Take 1 tablet by mouth 2 (two) times daily. 30 tablet 0   tamsulosin (FLOMAX) 0.4 MG CAPS capsule TAKE 1 CAPSULE BY MOUTH EVERY DAY 90 capsule 1   valACYclovir (VALTREX) 500 MG tablet TAKE 1 TABLET (500 MG TOTAL) BY MOUTH DAILY. 30 tablet 2   baclofen (LIORESAL) 10 MG tablet Take 1 tablet (10 mg total) by mouth 3 (three) times daily. (Patient not taking: Reported on 07/02/2021) 90 each 0   No current facility-administered medications for this visit.    OBJECTIVE: There were no vitals filed for this visit.    There is no height or weight on file to calculate BMI.    ECOG FS:2 - Symptomatic, <50% confined to bed  General: Well-developed, well-nourished, no acute distress. HEENT: Normocephalic. Neuro: Alert, answering all questions appropriately. Cranial nerves grossly intact. Psych: Normal affect.  LAB RESULTS:  Lab Results  Component Value Date   NA 138 04/03/2021   K 4.7 04/03/2021   CL 104 04/03/2021   CO2 28 04/03/2021   GLUCOSE 138 (H) 04/03/2021   BUN 45 (H) 04/03/2021   CREATININE 0.94 04/03/2021   CALCIUM 9.2 04/03/2021   PROT 6.0 (L) 03/26/2021   ALBUMIN 3.8 03/26/2021   AST 41 03/26/2021   ALT 25 03/26/2021   ALKPHOS 104 03/26/2021   BILITOT 1.4 (H) 03/26/2021   GFRNONAA >60 04/03/2021   GFRAA 29 (L) 10/16/2017    Lab Results  Component Value Date   WBC 6.7 04/03/2021   NEUTROABS 5.2 03/26/2021   HGB 11.0 (L) 04/03/2021   HCT 33.7 (L) 04/03/2021   MCV 96.6 04/03/2021   PLT 122 (L) 04/03/2021     STUDIES: No results found.  ASSESSMENT: Stage II Kappa chain myeloma.  PLAN:    Stage II kappa chain myeloma: (11:14 translocation, high risk) SPEP essentially negative and immunoglobulins are within  normal limits.  Patient's kappa light chains have improved to greater than 5400 down to approximately 133.  Cycle 1 included Velcade and Revlimid, but given his declining performance status and difficulty to get into clinic this was transitioned to Revlimid only.  He is currently  taking Revlimid 25 mg daily for 21 days, with 7 days off.  He is also taking 20 mg dexamethasone weekly.  Patient reports he has approximately 5 days left of cycle 2.  Patient will have an in person clinic visit on July 14, 2021 to initiate cycle 3.  Appreciate palliative care and clinical pharmacy input.   Pain: Improving.  Continue current narcotic regimen and gabapentin.  Patient's wife reports he takes minimal short acting oxycodone. Anemia: Chronic and unchanged. Right flank mass: Unclear etiology.  Patient has an MRI scheduled next week.  Follow-up November 1 as above.  I provided 30 minutes of face-to-face video visit time during this encounter which included chart review, counseling, and coordination of care as documented above.   Patient expressed understanding and was in agreement with this plan. He also understands that He can call clinic at any time with any questions, concerns, or complaints.   Cancer Staging Kappa light chain myeloma ([place]) Staging form: Plasma Cell Myeloma and Plasma Cell Disorders, AJCC 8th Edition - Clinical stage from 04/16/2021: Albumin (g/dL): 3.8, ISS: Stage II, High-risk cytogenetics: Absent, LDH: Unknown - Signed by Lloyd Huger, MD on 04/16/2021 Albumin range (g/dL): Greater than or equal to 3.5 Cytogenetics: t(11;14) translocation Serum calcium level: Normal Serum creatinine level: Normal Bone disease on imaging: Present  Lloyd Huger, MD   07/02/2021 4:02 PM

## 2021-06-30 ENCOUNTER — Encounter: Payer: Self-pay | Admitting: Oncology

## 2021-06-30 ENCOUNTER — Other Ambulatory Visit: Payer: Self-pay | Admitting: Hospice and Palliative Medicine

## 2021-06-30 MED ORDER — OXYCODONE HCL 5 MG PO TABS: 5.0000 mg | ORAL_TABLET | ORAL | 0 refills | Status: DC | PRN

## 2021-06-30 MED FILL — OXYCODONE HCL (IR) 5 MG TABLET: 15 days supply | Qty: 90 | Fill #0

## 2021-07-01 ENCOUNTER — Inpatient Hospital Stay: Payer: PPO | Attending: Hospice and Palliative Medicine | Admitting: Hospice and Palliative Medicine

## 2021-07-01 DIAGNOSIS — C9000 Multiple myeloma not having achieved remission: Secondary | ICD-10-CM | POA: Diagnosis not present

## 2021-07-01 DIAGNOSIS — G893 Neoplasm related pain (acute) (chronic): Secondary | ICD-10-CM | POA: Diagnosis not present

## 2021-07-01 DIAGNOSIS — Z515 Encounter for palliative care: Secondary | ICD-10-CM

## 2021-07-01 DIAGNOSIS — R1900 Intra-abdominal and pelvic swelling, mass and lump, unspecified site: Secondary | ICD-10-CM | POA: Diagnosis not present

## 2021-07-01 NOTE — Progress Notes (Signed)
Virtual Visit via video note  I connected with Jack [name] on 07/01/21 at  1:00 PM EDT by a video enabled telemedicine application and verified that I am speaking with the correct person using two identifiers.  Location: Patient: Home Provider: Clinic   I discussed the limitations of evaluation and management by telemedicine and the availability of in person appointments. The patient expressed understanding and agreed to proceed.  History of Present Illness: Jack [name] is a 87 y.o. male with multiple medical problems including hypertension, CKD stage IIIb, A. fib on Eliquis, and anemia.  Patient was hospitalized 01/02/2021 to 01/07/2021 with T12 compression fracture with recommendation for conservative management including use of a TLSO brace.  Patient was hospitalized again 01/29/2021 to 02/07/2021 with recurrent back pain and MR showing T12 and new L1 compression fracture.  Patient underwent T12/L1 kyphoplasty on 02/02/2021.  There was also suspicion of a left fifth rib fracture, which occurred while transitioning from the stretcher for his kyphoplasty.  Patient had mild hypercalcemia with SPEP and UPEP concerning for possible myeloma.  Patient was referred to [facility] for work-up.  He was readmitted 03/11/2021 - 03/17/2021 with worsening back pain.  CT revealed new compression fractures of L2 and L3.  Patient underwent kyphoplasty on 6/30.  He was hospitalized again 03/26/2021-04/07/2021 with severe back/flank pain.  MRI revealed T10 pathologic compression fracture.  He was started on treatment for multiple myeloma with dexamethasone/Velcade and XRT to spine.  Palliative care was consulted to help address goals and manage ongoing symptoms  Observations/Objective: Video visit today with patient and wife.  Overall, patient appears to be doing significantly better with improved performance status.  Pain is also improved.  Patient has not consistently requiring oxycodone IR.  Appetite has  improved.  Patient requests that we review his medications today with consideration for options for discontinuing unnecessary meds.  I did suggest that he could stop the baclofen and we could consider weaning process for opioids.  Of note, patient does endorse worsening right abdominal/flank mass that has become increasingly tender to touch over the past couple months.  I had previously examined this in the clinic but CT of the abdomen was unrevealing.  It does appear on video today that this mass has significantly increased in size.  Wife describes it as soft and easily compressible but patient does endorse some tenderness.    Assessment and Plan: Multiple myeloma -on treatment with Revlimid/dexamethasone.  Appears to be tolerating treatments well without any adverse effects.  Followed by Dr. Grayland Ormond who patient will see virtually tomorrow.  Neoplasm related pain -continue OxyContin/oxycodone/gabapentin.  Recommend discontinuing baclofen and may begin weaning process of oxycodone IR.  Soft tissue abdominal mass -discussed with Dr. Grayland Ormond and will obtain MRI for evaluation.   Follow Up Instructions: Follow-up MyChart visit 3 to 4 weeks   I discussed the assessment and treatment plan with the patient. The patient was provided an opportunity to ask questions and all were answered. The patient agreed with the plan and demonstrated an understanding of the instructions.   The patient was advised to call back or seek an in-person evaluation if the symptoms worsen or if the condition fails to improve as anticipated.  I provided 18 minutes of non-face-to-face time during this encounter.   Irean Hong, NP

## 2021-07-02 ENCOUNTER — Inpatient Hospital Stay (HOSPITAL_BASED_OUTPATIENT_CLINIC_OR_DEPARTMENT_OTHER): Payer: PPO | Admitting: Oncology

## 2021-07-02 ENCOUNTER — Inpatient Hospital Stay: Payer: PPO | Admitting: Pharmacist

## 2021-07-02 ENCOUNTER — Telehealth: Payer: Self-pay | Admitting: Hospice and Palliative Medicine

## 2021-07-02 DIAGNOSIS — C9000 Multiple myeloma not having achieved remission: Secondary | ICD-10-CM

## 2021-07-02 MED ORDER — LENALIDOMIDE 25 MG PO CAPS
25.0000 mg | ORAL_CAPSULE | Freq: Every day | ORAL | 0 refills | Status: DC
Start: 2021-07-02 — End: 2021-08-05

## 2021-07-02 NOTE — Progress Notes (Signed)
Pt wife confirmed availability for virtual visit as scheduled. States pt has MRI scheduled per Merrily Pew, NP for swelling in his side. No other concerns at this time.

## 2021-07-02 NOTE — Progress Notes (Signed)
[facility]  [phone] [phone]  Patient Care Team: [name], MD as PCP - General (Family Medicine) End, [name], MD as PCP - Cardiology (Cardiology) [name], MD (Endocrinology)   Name of the patient: Jack [name]  [id number]  1933/05/02   Date of visit: 07/02/21  Virtual visit: I connected with  [name] on 07/02/21 at  2:45 PM EDT by a video enabled telemedicine application and verified that I am speaking with the correct person using two identifiers. I discussed the limitations of evaluation and management by telemedicine and the availability of in person appointments. The patient expressed understanding and agreed to proceed. Locations: Patient: home , Provider: in office Visit completed in conjunction with Dr. Gary Fleet visit  HPI: Patient is a 87 y.o. male with newly diagnosed multiple myeloma. Currently treated with Revlimid (lenalidomide) and dexamethasone. Patient was initiated on an all oral regimen because he was unable to physically make it into clinic for treatment due to his disease/performance status.   Reason for Consult: Oral chemotherapy follow-up for lenalidomide therapy.   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[place])   03/27/2021 -  Chemotherapy    Patient is on Treatment Plan: MYELOMA NON-TRANSPLANT CANDIDATES VRD SQ Q21D        04/16/2021 Cancer Staging   Staging form:  Plasma Cell Myeloma and Plasma Cell Disorders, AJCC 8th Edition - Clinical stage from 04/16/2021: Albumin (g/dL): 3.8, ISS: Stage II, High-risk cytogenetics: Absent, LDH: Unknown - Signed by Lloyd Huger, MD on 04/16/2021 Albumin range (g/dL): Greater than or equal to 3.5 Cytogenetics: t(11;14) translocation Serum calcium level: Normal Serum creatinine level: Normal Bone disease on imaging: Present     ALLERGIES:  is allergic to quinolones, amlodipine, azithromycin, lisinopril, and morphine and related.  MEDICATIONS:  Current Outpatient Medications  Medication Sig Dispense Refill   apixaban (ELIQUIS) 5 MG TABS tablet Take 1 tablet (5 mg total) by mouth 2 (two) times daily. Increase dose from 2.5 mg. 180 tablet 0   atorvastatin (LIPITOR) 40 MG tablet Take 1 tablet (40 mg total) by mouth daily at 6 PM. 90 tablet 3   baclofen (LIORESAL) 10 MG tablet Take 1 tablet (10 mg total) by mouth 3 (three) times daily. (Patient not taking: Reported on 07/02/2021) 90 each 0   calcitonin, salmon, (MIACALCIN/FORTICAL) 200 UNIT/ACT nasal spray Place 1 spray into alternate nostrils daily. 3.7 mL 12   carvedilol (COREG) 25 MG tablet Take 1 tablet (25 mg total) by mouth 2 (two) times daily with a meal. 60 tablet 1   dexamethasone (DECADRON) 4 MG tablet Take 5 tablets (20 mg total) by mouth once a week. 20 tablet 4   docusate sodium (COLACE) 100 MG capsule Take 1 capsule (100 mg total) by mouth 2 (two) times daily. 60 capsule 0   feeding supplement (ENSURE ENLIVE / ENSURE PLUS) LIQD Take 237 mLs by mouth  3 (three) times daily between meals. 237 mL 12   gabapentin (NEURONTIN) 300 MG capsule Take 2 capsules (600 mg total) by mouth 3 (three) times daily. Can give equivalent if cheaper. 180 capsule 2   lenalidomide (REVLIMID) 25 MG capsule Take 1 capsule (25 mg total) by mouth daily. Take for 21 days, then hold for 7 days. Repeat every 28 days. 21 capsule 0   lidocaine (XYLOCAINE) 5 % ointment Apply topically 3  (three) times daily as needed for mild pain or moderate pain. 35.44 g 0   Multiple Vitamin (MULTIVITAMIN WITH MINERALS) TABS tablet Take 1 tablet by mouth daily.     Multiple Vitamins-Minerals (PRESERVISION AREDS PO) Take by mouth.     naloxone (NARCAN) nasal spray 4 mg/0.1 mL SPRAY 1 SPRAY INTO ONE NOSTRIL AS DIRECTED FOR OPIOID OVERDOSE (TURN PERSON ON SIDE AFTER DOSE. IF NO RESPONSE IN 2-3 MINUTES OR PERSON RESPONDS BUT RELAPSES, REPEAT USING A NEW SPRAY DEVICE AND SPRAY INTO THE OTHER NOSTRIL. CALL 911 AFTER USE.) * EMERGENCY USE ONLY * 1 each 0   ondansetron (ZOFRAN) 8 MG tablet Take 1 tablet (8 mg total) by mouth every 8 (eight) hours as needed for nausea or vomiting. 60 tablet 2   oxyCODONE (OXY IR/ROXICODONE) 5 MG immediate release tablet Take 1 tablet (5 mg total) by mouth every 4 (four) hours as needed for severe pain. 90 tablet 0   oxyCODONE (OXYCONTIN) 20 mg 12 hr tablet Take 1 tablet (20 mg total) by mouth every 12 (twelve) hours. 60 tablet 0   polyethylene glycol (MIRALAX / GLYCOLAX) 17 g packet Take 17 g by mouth 2 (two) times daily.  0   potassium citrate (UROCIT-K) 10 MEQ (1080 MG) SR tablet Take 1 tablet (10 mEq total) by mouth 3 (three) times daily with meals. 90 tablet 6   senna-docusate (SENOKOT-S) 8.6-50 MG tablet Take 1 tablet by mouth 2 (two) times daily. 30 tablet 0   tamsulosin (FLOMAX) 0.4 MG CAPS capsule TAKE 1 CAPSULE BY MOUTH EVERY DAY 90 capsule 1   valACYclovir (VALTREX) 500 MG tablet TAKE 1 TABLET (500 MG TOTAL) BY MOUTH DAILY. 30 tablet 2   No current facility-administered medications for this visit.    VITAL SIGNS: There were no vitals taken for this visit. There were no vitals filed for this visit.  Estimated body mass index is 23.95 kg/m as calculated from the following:   Height as of 03/27/21: '5\' 9"'  (1.753 m).   Weight as of 04/07/21: 73.6 kg (162 lb 3.2 oz).  LABS: CBC:    Component Value Date/Time   WBC 6.7 04/03/2021 0552   HGB 11.0 (L) 04/03/2021  0552   HGB 11.0 (L) 02/24/2021 1014   HCT 33.7 (L) 04/03/2021 0552   HCT 35.3 (L) 02/24/2021 1014   PLT 122 (L) 04/03/2021 0552   PLT 249 02/24/2021 1014   MCV 96.6 04/03/2021 0552   MCV 96 02/24/2021 1014   MCV 90 01/24/2013 0819   NEUTROABS 5.2 03/26/2021 1549   NEUTROABS 7.4 (H) 01/24/2013 0819   LYMPHSABS 1.4 03/26/2021 1549   LYMPHSABS 1.2 01/24/2013 0819   MONOABS 0.3 03/26/2021 1549   MONOABS 0.3 01/24/2013 0819   EOSABS 0.0 03/26/2021 1549   EOSABS 0.0 01/24/2013 0819   BASOSABS 0.0 03/26/2021 1549   BASOSABS 0.0 01/24/2013 0819   Comprehensive Metabolic Panel:    Component Value Date/Time   NA 138 04/03/2021 0552   NA 136 02/24/2021 1014   NA 138 01/24/2013 0819  K 4.7 04/03/2021 0552   K 3.8 01/24/2013 0819   CL 104 04/03/2021 0552   CL 105 01/24/2013 0819   CO2 28 04/03/2021 0552   CO2 25 01/24/2013 0819   BUN 45 (H) 04/03/2021 0552   BUN 23 02/24/2021 1014   BUN 20 (H) 01/24/2013 0819   CREATININE 0.94 04/03/2021 0552   CREATININE 1.52 (H) 01/24/2013 0819   GLUCOSE 138 (H) 04/03/2021 0552   GLUCOSE 171 (H) 01/24/2013 0819   CALCIUM 9.2 04/03/2021 0552   CALCIUM 10.4 (H) 02/19/2021 1229   AST 41 03/26/2021 1549   AST 18 01/24/2013 0819   ALT 25 03/26/2021 1549   ALT 15 01/24/2013 0819   ALKPHOS 104 03/26/2021 1549   ALKPHOS 74 01/24/2013 0819   BILITOT 1.4 (H) 03/26/2021 1549   BILITOT 0.4 02/24/2021 1014   BILITOT 0.5 01/24/2013 0819   PROT 6.0 (L) 03/26/2021 1549   PROT 6.2 02/24/2021 1014   PROT 6.3 (L) 01/24/2013 0819   ALBUMIN 3.8 03/26/2021 1549   ALBUMIN 4.5 02/24/2021 1014   ALBUMIN 3.6 01/24/2013 0819    Present during today's visit: Patient and [name]  Assessment and Plan-  Reviewed CBC/BMP and light chains from 06/15/21 reviewed.   Continue lenalidomide 15m 21on/7off and dexamethasone, today is C3D17  Patient will begin coming to clinic in person for his labs and appt with the start of cycle 4   Oral Chemotherapy Side  Effect/Intolerance:  No reported rash, N/V, diarrhea, or constipation. He is tolerating the treatment well and overall much improved from pretreatment.   Oral Chemotherapy Adherence: no reported missed doses No patient barriers to medication adherence identified.   New medications: none reported  Medication Access Issues: no issues, fills Revlimid at [facility], refill sent in today  Patient expressed understanding and was in agreement with this plan. He also understands that He can call clinic at any time with any questions, concerns, or complaints.   Follow-up plan: RTC on 11/1 with start of next cycle  Thank you for allowing me to participate in the care of this very pleasant patient.   Time Total: 15 mins  Visit consisted of counseling and education on dealing with issues of symptom management in the setting of serious and potentially life-threatening illness.Greater than 50%  of this time was spent counseling and coordinating care related to the above assessment and plan.  Signed by: ADarl Pikes PharmD, BCPS, BSalley Slaughter CPP Hematology/Oncology Clinical Pharmacist Practitioner [phone] 07/02/2021 2:33 PM

## 2021-07-02 NOTE — Telephone Encounter (Signed)
Per secure pt needed to be scheduled for MRI. Scan was scheduled and VM left with detailed appt. Pt asked to contact offiice to confirm message received.

## 2021-07-05 ENCOUNTER — Encounter: Payer: Self-pay | Admitting: Oncology

## 2021-07-06 ENCOUNTER — Encounter: Payer: Self-pay | Admitting: Oncology

## 2021-07-06 ENCOUNTER — Other Ambulatory Visit: Payer: Self-pay | Admitting: Emergency Medicine

## 2021-07-06 MED ORDER — CARVEDILOL 25 MG PO TABS: 25.0000 mg | ORAL_TABLET | Freq: Two times a day (BID) | ORAL | 1 refills | Status: DC

## 2021-07-06 MED ORDER — LIDOCAINE 5 % EX OINT
TOPICAL_OINTMENT | Freq: Three times a day (TID) | CUTANEOUS | 0 refills | Status: DC | PRN
Start: 2021-07-06 — End: 2021-08-19

## 2021-07-06 MED FILL — CARVEDILOL 25 MG TABLET: 30 days supply | Qty: 60 | Fill #0

## 2021-07-06 MED FILL — LIDOCAINE 5% OINTMENT: 10 days supply | Fill #0

## 2021-07-07 ENCOUNTER — Encounter: Payer: Self-pay | Admitting: Oncology

## 2021-07-07 ENCOUNTER — Other Ambulatory Visit: Payer: Self-pay

## 2021-07-07 ENCOUNTER — Encounter: Payer: Self-pay | Admitting: Nurse Practitioner

## 2021-07-07 ENCOUNTER — Other Ambulatory Visit: Payer: Self-pay | Admitting: Emergency Medicine

## 2021-07-07 ENCOUNTER — Other Ambulatory Visit: Payer: PPO | Admitting: Nurse Practitioner

## 2021-07-07 DIAGNOSIS — Z515 Encounter for palliative care: Secondary | ICD-10-CM | POA: Diagnosis not present

## 2021-07-07 DIAGNOSIS — C9000 Multiple myeloma not having achieved remission: Secondary | ICD-10-CM

## 2021-07-07 DIAGNOSIS — R5381 Other malaise: Secondary | ICD-10-CM

## 2021-07-07 MED ORDER — GABAPENTIN 300 MG PO CAPS: 600.0000 mg | ORAL_CAPSULE | Freq: Three times a day (TID) | ORAL | 2 refills | Status: DC

## 2021-07-07 MED ORDER — APIXABAN 5 MG PO TABS: 5.0000 mg | ORAL_TABLET | Freq: Two times a day (BID) | ORAL | 0 refills | Status: DC

## 2021-07-07 MED FILL — ELIQUIS 5 MG TABLET: 90 days supply | Qty: 180 | Fill #0

## 2021-07-07 MED FILL — GABAPENTIN 300 MG CAPSULE: 30 days supply | Qty: 180 | Fill #0

## 2021-07-07 NOTE — Progress Notes (Signed)
[facility] Consult Note Telephone: [phone]  Fax: [phone]    Date of encounter: 07/07/21 5:13 PM PATIENT NAME: Jack [name] [address] [place] [address]   [phone] (home)  DOB: [birth year] MRN: [id number] PRIMARY CARE PROVIDER:    [name], MD,  [address] [address] [phone]  RESPONSIBLE PARTY:    Contact Information     Name Relation Home Work Mobile   Jack [name] Spouse [phone]  [phone]      I met face to face with patient and family in home. Palliative Care was asked to follow this patient by consultation request of  Jack Pitch, MD to address advance care planning and complex medical decision making. This is a follow up visit.                                  ASSESSMENT AND PLAN / RECOMMENDATIONS:  Symptom Management/Plan: 1. Advance Care Planning; DNR; Currently taking oral chemotherapy, doing well. Wishes to continue    2. Goals of Care: Goals include to maximize quality of life and symptom management. Our advance care planning conversation included a discussion about:    The value and importance of advance care planning  Exploration of personal, cultural or spiritual beliefs that might influence medical decisions  Exploration of goals of care in the event of a sudden injury or illness  Identification and preparation of a healthcare agent  Review and updating or creation of an advance directive document.   3. Palliative care encounter; Palliative care encounter; Palliative medicine team will continue to support patient, patient's family, and medical team. Visit consisted of counseling and education dealing with the complex and emotionally intense issues of symptom management and palliative care in the setting of serious and potentially life-threatening illness   4. Debility secondary to multiple myeloma and chronic pain, Reviewed pain regimen; Continue  mobility as about though overall continues to decline in the setting of malignancy.   Follow up Palliative Care Visit: Palliative care will continue to follow for complex medical decision making, advance care planning, and clarification of goals. Return 8 weeks or prn.  I spent 48 minutes providing this consultation. More than 50% of the time in this consultation was spent in counseling and care coordination.  PPS: 40%  Chief Complaint: Follow up palliative consult for complex medical decision making  HISTORY OF PRESENT ILLNESS:  Jack [name] is a 87 y.o. year old male  with  multiple medical problems including hypertension, CKD stage IIIb, A. fib on Eliquis, and anemia.  Hospitalized 01/02/2021 to 01/07/2021 with T12 compression fracture with recommendation for conservative management including use of a TLSO brace. Hospitalized 01/29/2021 to 02/07/2021 with recurrent back pain and MR showing T12 and new L1 compression fracture. Underwent T12/L1 kyphoplasty on 02/02/2021. Suspicion of a left fifth rib fracture, which occurred while transitioning from the stretcher for his kyphoplasty.  Jack [name] had mild hypercalcemia with SPEP and UPEP concerning for possible myeloma.  Referred to Jack [name] for work-up with readmission 03/11/2021 - 03/17/2021 with worsening back pain, CT revealed new compression fractures of L2 and L3, underwent kyphoplasty on 6/30. Hospitalized again 03/26/2021-04/07/2021 with severe back/flank pain, MRI revealed T10 pathologic compression fracture, started on treatment for multiple myeloma. Last visit with Dr Jack [name], Oncologist 07/02/21 Cycle 1 included Velcade and Revlimid, but given his declining performance status and difficulty to  get into clinic this was transitioned to Revlimid only.  He is currently taking Revlimid 25 mg daily for 21 days, with 7 days off.  He is also taking 20 mg dexamethasone weekly.  Patient reports he has approximately 5 days left of cycle 2. I called Ms.  [name] to confirm PC visit. I visited Mr. and Jack [name] in their home. We talked about how Jack [name] has been feeling. Jack [name] endorses continues to be tired, otherwise increase in strength. Jack [name] continues to walk with walker. No recent falls. We talked about symptoms, appetite which has improved to 3 meals with supplement. We talked about the mass noted to right side of abdomen which he has a scheduled MRI to complete. Jack [name] talked about the mass noted. We talked about starting oral chemotherapy without any noted side effects. We talked about medical goals. Jack [name] talked about medical bills, she was f/u with home health. Jack [name] appears stable at present time. Therapeutic listening, emotional support provided. Questions answered. Discussed will have f/u pc visit with NP in 2 months, will have PC RN contact in 1 month for f/u with receiving chemo.   Cancer Staging Kappa light chain myeloma ([place]) Staging form: Plasma Cell Myeloma and Plasma Cell Disorders, AJCC 8th Edition - Clinical stage from 04/16/2021: Albumin (g/dL): 3.8, ISS: Stage II, High-risk cytogenetics: Absent, LDH: Unknown - Signed by Jack Huger, MD on 04/16/2021 Albumin range (g/dL): Greater than or equal to 3.5 Cytogenetics: t(11;14) translocation Serum calcium level: Normal Serum creatinine level: Normal Bone disease on imaging: Present  History obtained from review of EMR, discussion with Jack [name].  I reviewed available labs, medications, imaging, studies and related documents from the EMR.  Records reviewed and summarized above.   ROS Full 10 system review of systems performed and negative with exception of: as per HPI.   Physical Exam: Constitutional: NAD General: frail appearing, thin pleasant male EYES: lids intact ENMT: oral mucous membranes moist CV: S1S2, RRR Pulmonary: LCTA, no increased work of breathing, no cough, room air Abdomen:  normo-active BS + 4 quadrants, soft and non  tender MSK: ambulatory with walker Skin: warm and dry Neuro:  + generalized weakness Psych: non-anxious affect, A and O x 3  Questions and concerns were addressed. The patient/family was encouraged to call with questions and/or concerns. Provided general support and encouragement, no other unmet needs identified   Thank you for the opportunity to participate in the care of Mr. Pavlov.  The palliative care team will continue to follow. Please call our office at [phone] if we can be of additional assistance.   This chart was dictated using voice recognition software.  Despite best efforts to proofread,  errors can occur which can change the documentation meaning.   Jamayia Croker Z Kataleya Zaugg, NP   COVID-19 PATIENT SCREENING TOOL Asked and negative response unless otherwise noted:   Have you had symptoms of covid, tested positive or been in contact with someone with symptoms/positive test in the past 5-10 days? NO

## 2021-07-09 ENCOUNTER — Encounter: Payer: Self-pay | Admitting: Pharmacist

## 2021-07-09 MED FILL — DEXAMETHASONE 4 MG TABLET: 27 days supply | Qty: 20 | Fill #3

## 2021-07-09 NOTE — Progress Notes (Signed)
[place]  [phone] [phone]  ID: Jack [name] OB: 05-26-1933  MR#: [id number]  DPO#:[id number]  Patient Care Team: [name], MD as PCP - General (Family Medicine) End, [name], MD as PCP - Cardiology (Cardiology) [name], MD (Endocrinology)   CHIEF COMPLAINT: Stage II kappa chain myeloma.  INTERVAL HISTORY: Patient evaluated in clinic today for repeat laboratory work and continuation of Revlimid.  He continues to have significant weakness and fatigue, but this is improved.  His pain continues to be well controlled on his current narcotic regimen.  He is tolerating Revlimid without significant side effects. He has no neurologic complaints.  He denies any recent fevers.  His appetite has improved.  He has no chest pain, shortness of breath, cough, or hemoptysis.  He denies any nausea, vomiting, constipation, or diarrhea.  He has no urinary complaints.  Patient offers no further specific complaints today.  REVIEW OF SYSTEMS:   Review of Systems  Constitutional:  Positive for malaise/fatigue. Negative for fever.  Respiratory: Negative.  Negative for cough, hemoptysis and shortness of breath.   Cardiovascular: Negative.  Negative for chest pain and leg swelling.  Gastrointestinal: Negative.  Negative for abdominal pain.  Genitourinary:  Positive for flank pain. Negative for dysuria.  Musculoskeletal:  Positive for back pain. Negative for joint pain.  Skin: Negative.  Negative for rash.  Neurological:  Positive for weakness. Negative for dizziness, focal weakness and headaches.  Psychiatric/Behavioral: Negative.  The patient is not nervous/anxious.    As per HPI. Otherwise, a complete review of systems is negative.  PAST MEDICAL HISTORY: Past Medical History:  Diagnosis Date   Elevated prostate specific antigen (PSA)    has been 7 for a year    GERD (gastroesophageal reflux disease)    History of colon polyps 2008    [facility],    History of kidney stones    Hyperlipidemia    Hypertension    Prostate hypertrophy    diagnosed at age 35 due to hematospermia   Renal disorder     PAST SURGICAL HISTORY: Past Surgical History:  Procedure Laterality Date   CATARACT EXTRACTION W/PHACO Left 01/10/2018   Procedure: CATARACT EXTRACTION PHACO AND INTRAOCULAR LENS PLACEMENT ([place]);  Surgeon: Birder Robson, MD;  Location: [facility] ORS;  Service: Ophthalmology;  Laterality: Left;  [name] 00:24.8 AP% 14.9 CDE 3.68 Fluid pack lot # [id number]   CATARACT EXTRACTION W/PHACO Right 01/25/2018   Procedure: CATARACT EXTRACTION PHACO AND INTRAOCULAR LENS PLACEMENT ([facility]);  Surgeon: Birder Robson, MD;  Location: [facility] OR[facility];  Service: Ophthalmology;  Laterality: Right;  [name] 00:42 AP% 10.8 CDE 4.59 Fluid pack lot # [id number]   COLON SURGERY     CYSTOSCOPY W/ URETERAL STENT PLACEMENT Right 10/16/2017   Procedure: right  URETERAL STENT PLACEMENT,cystoscopy bilateral stent removal,rretrograde;  Surgeon: Abbie Sons, MD;  Location: [facility] ORS;  Service: Urology;  Laterality: Right;   CYSTOSCOPY/URETEROSCOPY/HOLMIUM LASER/STENT PLACEMENT Right 12/16/2020   Procedure: CYSTOSCOPY/URETEROSCOPY/HOLMIUM LASER/STENT PLACEMENT;  Surgeon: Abbie Sons, MD;  Location: [facility] ORS;  Service: Urology;  Laterality: Right;   EXTRACORPOREAL SHOCK WAVE LITHOTRIPSY Right 12/11/2020   Procedure: EXTRACORPOREAL SHOCK WAVE LITHOTRIPSY (ESWL);  Surgeon: Abbie Sons, MD;  Location: [facility] OR[facility];  Service: Urology;  Laterality: Right;   IR KYPHO EA ADDL LEVEL THORACIC OR LUMBAR  02/02/2021   IR KYPHO LUMBAR INC FX REDUCE BONE BX UNI/BIL CANNULATION INC/IMAGING  02/02/2021   KIDNEY STONE SURGERY     KYPHOPLASTY N/A 03/12/2021  Procedure: KYPHOPLASTY, L2, L3;  Surgeon: Menz, Michael, MD;  Location: [facility] ORS;  Service: Orthopedics;  Laterality: N/A;   RESECTION SOFT TISSUE TUMOR LEG / ANKLE RADICAL  jan 2009   [facility],  right thigh/knee , nonmalignant    SMALL INTESTINE SURGERY  1946   implaed on picket fence, punctured stomach   TONSILLECTOMY      FAMILY HISTORY: Family History  Problem Relation Age of Onset   Hypertension Father    Hyperlipidemia Father    Cancer Sister        thyroid - dx in late 20's    ADVANCED DIRECTIVES (Y/N):  N  HEALTH MAINTENANCE: Social History   Tobacco Use   Smoking status: Former    Types: Cigarettes    Quit date: 07/05/1965    Years since quitting: 56.0   Smokeless tobacco: Never  Vaping Use   Vaping Use: Never used  Substance Use Topics   Alcohol use: Yes    Alcohol/week: 1.0 standard drink    Types: 1 Standard drinks or equivalent per week    Comment: occassionaly   Drug use: No     Colonoscopy:  PAP:  Bone density:  Lipid panel:  Allergies  Allergen Reactions   Quinolones     Aortic dilation contraindicates FQ   Amlodipine Other (See Comments)    LE edema   Azithromycin Nausea And Vomiting   Lisinopril Cough   Morphine And Related Nausea And Vomiting    Current Outpatient Medications  Medication Sig Dispense Refill   apixaban (ELIQUIS) 5 MG TABS tablet Take 1 tablet (5 mg total) by mouth 2 (two) times daily. Increase dose from 2.5 mg. 180 tablet 0   atorvastatin (LIPITOR) 40 MG tablet Take 1 tablet (40 mg total) by mouth daily at 6 PM. 90 tablet 3   calcitonin, salmon, (MIACALCIN/FORTICAL) 200 UNIT/ACT nasal spray Place 1 spray into alternate nostrils daily. 3.7 mL 12   carvedilol (COREG) 25 MG tablet Take 1 tablet (25 mg total) by mouth 2 (two) times daily with a meal. 60 tablet 1   dexamethasone (DECADRON) 4 MG tablet Take 5 tablets (20 mg total) by mouth once a week. 20 tablet 4   docusate sodium (COLACE) 100 MG capsule Take 1 capsule (100 mg total) by mouth 2 (two) times daily. 60 capsule 0   feeding supplement (ENSURE ENLIVE / ENSURE PLUS) LIQD Take 237 mLs by mouth 3 (three) times daily between meals. 237 mL 12   gabapentin (NEURONTIN) 300 MG capsule Take 2 capsules  (600 mg total) by mouth 3 (three) times daily. Can give equivalent if cheaper. 180 capsule 2   lenalidomide (REVLIMID) 25 MG capsule Take 1 capsule (25 mg total) by mouth daily. Take for 21 days, then hold for 7 days. Repeat every 28 days. 21 capsule 0   lidocaine (XYLOCAINE) 5 % ointment Apply topically 3 (three) times daily as needed for mild pain or moderate pain. 35.44 g 0   Multiple Vitamin (MULTIVITAMIN WITH MINERALS) TABS tablet Take 1 tablet by mouth daily.     Multiple Vitamins-Minerals (PRESERVISION AREDS PO) Take by mouth.     naloxone (NARCAN) nasal spray 4 mg/0.1 mL SPRAY 1 SPRAY INTO ONE NOSTRIL AS DIRECTED FOR OPIOID OVERDOSE (TURN PERSON ON SIDE AFTER DOSE. IF NO RESPONSE IN 2-3 MINUTES OR PERSON RESPONDS BUT RELAPSES, REPEAT USING A NEW SPRAY DEVICE AND SPRAY INTO THE OTHER NOSTRIL. CALL 911 AFTER USE.) * EMERGENCY USE ONLY * 1 each 0   ondansetron (  ZOFRAN) 8 MG tablet Take 1 tablet (8 mg total) by mouth every 8 (eight) hours as needed for nausea or vomiting. 60 tablet 2   oxyCODONE (OXY IR/ROXICODONE) 5 MG immediate release tablet Take 1 tablet (5 mg total) by mouth every 4 (four) hours as needed for severe pain. 90 tablet 0   oxyCODONE (OXYCONTIN) 20 mg 12 hr tablet Take 1 tablet (20 mg total) by mouth every 12 (twelve) hours. 60 tablet 0   polyethylene glycol (MIRALAX / GLYCOLAX) 17 g packet Take 17 g by mouth 2 (two) times daily.  0   potassium citrate (UROCIT-K) 10 MEQ (1080 MG) SR tablet Take 1 tablet (10 mEq total) by mouth 3 (three) times daily with meals. 90 tablet 6   senna-docusate (SENOKOT-S) 8.6-50 MG tablet Take 1 tablet by mouth 2 (two) times daily. 30 tablet 0   tamsulosin (FLOMAX) 0.4 MG CAPS capsule TAKE 1 CAPSULE BY MOUTH EVERY DAY 90 capsule 1   valACYclovir (VALTREX) 500 MG tablet TAKE 1 TABLET (500 MG TOTAL) BY MOUTH DAILY. 30 tablet 2   baclofen (LIORESAL) 10 MG tablet Take 1 tablet (10 mg total) by mouth 3 (three) times daily. (Patient not taking: No sig  reported) 90 each 0   No current facility-administered medications for this visit.    OBJECTIVE: Vitals:   07/14/21 1512  BP: 122/67  Pulse: (!) 58  Temp: (!) 95.8 F (35.4 C)  SpO2: 97%      Body mass index is 22.21 kg/m.    ECOG FS:2 - Symptomatic, <50% confined to bed  General: Well-developed, well-nourished, no acute distress. Eyes: Pink conjunctiva, anicteric sclera. HEENT: Normocephalic, moist mucous membranes. Lungs: No audible wheezing or coughing. Heart: Regular rate and rhythm. Abdomen: Soft, nontender, no obvious distention. Musculoskeletal: No edema, cyanosis, or clubbing. Neuro: Alert, answering all questions appropriately. Cranial nerves grossly intact. Skin: No rashes or petechiae noted. Psych: Normal affect.   LAB RESULTS:  Lab Results  Component Value Date   NA 135 07/14/2021   K 4.5 07/14/2021   CL 97 (L) 07/14/2021   CO2 28 07/14/2021   GLUCOSE 209 (H) 07/14/2021   BUN 33 (H) 07/14/2021   CREATININE 1.08 07/14/2021   CALCIUM 9.1 07/14/2021   PROT 6.0 (L) 03/26/2021   ALBUMIN 3.8 03/26/2021   AST 41 03/26/2021   ALT 25 03/26/2021   ALKPHOS 104 03/26/2021   BILITOT 1.4 (H) 03/26/2021   GFRNONAA >60 07/14/2021   GFRAA 29 (L) 10/16/2017    Lab Results  Component Value Date   WBC 5.0 07/14/2021   NEUTROABS 4.2 07/14/2021   HGB 11.3 (L) 07/14/2021   HCT 36.3 (L) 07/14/2021   MCV 97.1 07/14/2021   PLT 188 07/14/2021     STUDIES: MR Abdomen W Wo Contrast  Result Date: 07/13/2021 CLINICAL DATA:  History of multiple myeloma in this 87 year old male with therapy on going. Question of mass along the LEFT flank. EXAM: MRI ABDOMEN WITHOUT AND WITH CONTRAST TECHNIQUE: Multiplanar multisequence MR imaging of the abdomen was performed both before and after the administration of intravenous contrast. CONTRAST:  7.63m GADAVIST GADOBUTROL 1 MMOL/ML IV SOLN COMPARISON:  Comparison is made with March 26, 2021. FINDINGS: Lower chest: No effusion. No  consolidative process. Limited assessment of the lung bases on abdominal MRI. Hepatobiliary: No focal, suspicious hepatic lesion. No pericholecystic stranding or biliary duct dilation. Portal vein is patent. Pancreas: Mild pancreatic atrophy without visible lesion or sign of inflammation. Spleen:  Normal. Adrenals/Urinary Tract:  Adrenal  glands are normal. Symmetric renal enhancement. No hydronephrosis. Mild parenchymal thinning. No suspicious renal lesion. Stomach/Bowel: Small hiatal hernia, otherwise unremarkable to the extent evaluated on abdominal MRI. Vascular/Lymphatic: Atheromatous plaque in the abdominal aorta without aneurysmal dilation. Smooth contour of the aorta and the IVC. There is no gastrohepatic or hepatoduodenal ligament lymphadenopathy. No retroperitoneal or mesenteric lymphadenopathy. Other:  No ascites. Musculoskeletal: Compression fractures are noted in the spine, in the lumbar spine and lower thoracic spine. These are similar to previous imaging. No visible mass to correspond to areas marked along the LEFT flank and reported flank abnormality. Areas of enhancement along lower LEFT lateral ribs bilaterally presumably related to rib fractures in these locations. No masslike characteristics, LEFT posterior and lateral eleventh rib with more pronounced T2 signal may indicate more recent fracture (image 25/8). IMPRESSION: No visible mass to correspond to areas of marked along the LEFT flank and reported flank abnormality. Signs of bilateral presumed rib fractures in this patient with reported history of myeloma, LEFT lateral eleventh rib with some mild surrounding edema and more pronounced enhancement and other areas, correlate with any history of recent trauma or focal tenderness in this area, favored to represent more recent rib fracture. No acute intra-abdominal findings. Signs of compression fractures similar to prior imaging. Small hiatal hernia. Electronically Signed   By: Zetta Bills M.D.    On: 07/13/2021 08:40    ASSESSMENT: Stage II Kappa chain myeloma.  PLAN:    Stage II kappa chain myeloma: (11:14 translocation, high risk) SPEP essentially negative and immunoglobulins are within normal limits.  Patient's kappa light chains have improved to greater than 5400 down to 99.7.  Cycle 1 included Velcade and Revlimid, but given his declining performance status and difficulty to get into clinic this was transitioned to Revlimid only.  He is currently taking Revlimid 25 mg daily for 21 days, with 7 days off.  He is also taking 20 mg dexamethasone weekly.  Proceed with cycle 3 of Revlimid today.  Return to clinic in 4 weeks for further evaluation and continuation of treatment. Appreciate palliative care and clinical pharmacy input.   Pain: Improving.  Continue current narcotic regimen and gabapentin.  Patient's wife reports he takes minimal short acting oxycodone. Anemia: Hemoglobin improved to 11.3. Right flank mass: MRI results from July 13, 2021 reviewed independently and report as above with no obvious pathology on his right flank, but did reveal several fractured ribs.  Continue narcotic regimen as above. Bone lesions: Now the patient has the ability to come into clinic, will initiate Xgeva at next clinic visit.  Patient expressed understanding and was in agreement with this plan. He also understands that He can call clinic at any time with any questions, concerns, or complaints.   Cancer Staging Kappa light chain myeloma ([place]) Staging form: Plasma Cell Myeloma and Plasma Cell Disorders, AJCC 8th Edition - Clinical stage from 04/16/2021: Albumin (g/dL): 3.8, ISS: Stage II, High-risk cytogenetics: Absent, LDH: Unknown - Signed by Lloyd Huger, MD on 04/16/2021 Albumin range (g/dL): Greater than or equal to 3.5 Cytogenetics: t(11;14) translocation Serum calcium level: Normal Serum creatinine level: Normal Bone disease on imaging: Present  Lloyd Huger, MD   07/16/2021  9:55 AM

## 2021-07-10 ENCOUNTER — Other Ambulatory Visit: Payer: Self-pay

## 2021-07-10 ENCOUNTER — Ambulatory Visit
Admission: RE | Admit: 2021-07-10 | Discharge: 2021-07-10 | Disposition: A | Discharge status: Home / Self Care | Payer: PPO | Source: Ambulatory Visit | Attending: Hospice and Palliative Medicine | Admitting: Hospice and Palliative Medicine

## 2021-07-10 DIAGNOSIS — R1900 Intra-abdominal and pelvic swelling, mass and lump, unspecified site: Secondary | ICD-10-CM | POA: Insufficient documentation

## 2021-07-10 DIAGNOSIS — K8689 Other specified diseases of pancreas: Secondary | ICD-10-CM | POA: Diagnosis not present

## 2021-07-10 DIAGNOSIS — M8440XA Pathological fracture, unspecified site, initial encounter for fracture: Secondary | ICD-10-CM | POA: Diagnosis not present

## 2021-07-10 DIAGNOSIS — S22000A Wedge compression fracture of unspecified thoracic vertebra, initial encounter for closed fracture: Secondary | ICD-10-CM | POA: Diagnosis not present

## 2021-07-10 DIAGNOSIS — J9601 Acute respiratory failure with hypoxia: Secondary | ICD-10-CM | POA: Diagnosis not present

## 2021-07-10 DIAGNOSIS — K449 Diaphragmatic hernia without obstruction or gangrene: Secondary | ICD-10-CM | POA: Diagnosis not present

## 2021-07-10 DIAGNOSIS — M4856XA Collapsed vertebra, not elsewhere classified, lumbar region, initial encounter for fracture: Secondary | ICD-10-CM | POA: Diagnosis not present

## 2021-07-10 DIAGNOSIS — I700 Atherosclerosis of aorta: Secondary | ICD-10-CM | POA: Diagnosis not present

## 2021-07-10 DIAGNOSIS — C9000 Multiple myeloma not having achieved remission: Secondary | ICD-10-CM | POA: Diagnosis not present

## 2021-07-10 MED ORDER — GADOBUTROL 1 MMOL/ML IV SOLN
7.5000 mL | Freq: Once | INTRAVENOUS | Status: AC | PRN
  Administered 2021-07-10: 7.5 mL via INTRAVENOUS

## 2021-07-13 ENCOUNTER — Inpatient Hospital Stay: Payer: PPO

## 2021-07-13 ENCOUNTER — Other Ambulatory Visit: Payer: Self-pay | Admitting: Emergency Medicine

## 2021-07-13 DIAGNOSIS — C9000 Multiple myeloma not having achieved remission: Secondary | ICD-10-CM

## 2021-07-13 NOTE — Progress Notes (Signed)
[place]  [phone] [phone]  Patient Care Team: [name], MD as PCP - General (Family Medicine) End, [name], MD as PCP - Cardiology (Cardiology) [name], MD (Endocrinology)   Name of the patient: Jack [name]  [id number]  08-20-1933   Date of visit: 07/14/21  HPI: Patient is a 87 y.o. male with newly diagnosed multiple myeloma. Currently treated with Revlimid (lenalidomide) and dexamethasone. Patient was initiated on an all oral regimen because he was unable to physically make it into clinic at the time treatment due to his disease/performance status. His status has improved and today 07/14/21 he was able to make it back into clinic for in person visits.  Reason for Consult: Oral chemotherapy follow-up for lenalidomide therapy.   PAST MEDICAL HISTORY: Past Medical History:  Diagnosis Date   Elevated prostate specific antigen (PSA)    has been 7 for a year    GERD (gastroesophageal reflux disease)    History of colon polyps 2008   [facility],    History of kidney stones    Hyperlipidemia    Hypertension    Prostate hypertrophy    diagnosed at age 69 due to hematospermia   Renal disorder     HEMATOLOGY/ONCOLOGY HISTORY:  Oncology History  Kappa light chain myeloma ([place])  03/12/2021 Initial Diagnosis   Kappa light chain myeloma ([place])   03/27/2021 -  Chemotherapy    Patient is on Treatment Plan: MYELOMA NON-TRANSPLANT CANDIDATES VRD SQ Q21D        04/16/2021 Cancer Staging   Staging form: Plasma Cell Myeloma and Plasma Cell Disorders, AJCC 8th Edition - Clinical stage from 04/16/2021: Albumin (g/dL): 3.8, ISS: Stage II, High-risk cytogenetics: Absent, LDH: Unknown - Signed by Lloyd Huger, MD on 04/16/2021 Albumin range (g/dL): Greater than or equal to 3.5 Cytogenetics: t(11;14) translocation Serum calcium level: Normal Serum creatinine level: Normal Bone disease  on imaging: Present      ALLERGIES:  is allergic to quinolones, amlodipine, azithromycin, lisinopril, and morphine and related.  MEDICATIONS:  Current Outpatient Medications  Medication Sig Dispense Refill   apixaban (ELIQUIS) 5 MG TABS tablet Take 1 tablet (5 mg total) by mouth 2 (two) times daily. Increase dose from 2.5 mg. 180 tablet 0   atorvastatin (LIPITOR) 40 MG tablet Take 1 tablet (40 mg total) by mouth daily at 6 PM. 90 tablet 3   baclofen (LIORESAL) 10 MG tablet Take 1 tablet (10 mg total) by mouth 3 (three) times daily. (Patient not taking: No sig reported) 90 each 0   calcitonin, salmon, (MIACALCIN/FORTICAL) 200 UNIT/ACT nasal spray Place 1 spray into alternate nostrils daily. 3.7 mL 12   carvedilol (COREG) 25 MG tablet Take 1 tablet (25 mg total) by mouth 2 (two) times daily with a meal. 60 tablet 1   dexamethasone (DECADRON) 4 MG tablet Take 5 tablets (20 mg total) by mouth once a week. 20 tablet 4   docusate sodium (COLACE) 100 MG capsule Take 1 capsule (100 mg total) by mouth 2 (two) times daily. 60 capsule 0   feeding supplement (ENSURE ENLIVE / ENSURE PLUS) LIQD Take 237 mLs by mouth 3 (three) times daily between meals. 237 mL 12   gabapentin (NEURONTIN) 300 MG capsule Take 2 capsules (600 mg total) by mouth 3 (three) times daily. Can give equivalent if cheaper. 180 capsule 2   lenalidomide (REVLIMID) 25 MG capsule Take 1 capsule (25 mg total) by mouth daily. Take for 21  days, then hold for 7 days. Repeat every 28 days. 21 capsule 0   lidocaine (XYLOCAINE) 5 % ointment Apply topically 3 (three) times daily as needed for mild pain or moderate pain. 35.44 g 0   Multiple Vitamin (MULTIVITAMIN WITH MINERALS) TABS tablet Take 1 tablet by mouth daily.     Multiple Vitamins-Minerals (PRESERVISION AREDS PO) Take by mouth.     naloxone (NARCAN) nasal spray 4 mg/0.1 mL SPRAY 1 SPRAY INTO ONE NOSTRIL AS DIRECTED FOR OPIOID OVERDOSE (TURN PERSON ON SIDE AFTER DOSE. IF NO RESPONSE IN 2-3  MINUTES OR PERSON RESPONDS BUT RELAPSES, REPEAT USING A NEW SPRAY DEVICE AND SPRAY INTO THE OTHER NOSTRIL. CALL 911 AFTER USE.) * EMERGENCY USE ONLY * 1 each 0   ondansetron (ZOFRAN) 8 MG tablet Take 1 tablet (8 mg total) by mouth every 8 (eight) hours as needed for nausea or vomiting. 60 tablet 2   oxyCODONE (OXY IR/ROXICODONE) 5 MG immediate release tablet Take 1 tablet (5 mg total) by mouth every 4 (four) hours as needed for severe pain. 90 tablet 0   oxyCODONE (OXYCONTIN) 20 mg 12 hr tablet Take 1 tablet (20 mg total) by mouth every 12 (twelve) hours. 60 tablet 0   polyethylene glycol (MIRALAX / GLYCOLAX) 17 g packet Take 17 g by mouth 2 (two) times daily.  0   potassium citrate (UROCIT-K) 10 MEQ (1080 MG) SR tablet Take 1 tablet (10 mEq total) by mouth 3 (three) times daily with meals. 90 tablet 6   senna-docusate (SENOKOT-S) 8.6-50 MG tablet Take 1 tablet by mouth 2 (two) times daily. 30 tablet 0   tamsulosin (FLOMAX) 0.4 MG CAPS capsule TAKE 1 CAPSULE BY MOUTH EVERY DAY 90 capsule 1   valACYclovir (VALTREX) 500 MG tablet TAKE 1 TABLET (500 MG TOTAL) BY MOUTH DAILY. 30 tablet 2   No current facility-administered medications for this visit.    VITAL SIGNS: There were no vitals taken for this visit. There were no vitals filed for this visit.  Estimated body mass index is 22.21 kg/m as calculated from the following:   Height as of 03/27/21: '5\' 9"'  (1.753 m).   Weight as of an earlier encounter on 07/14/21: 68.2 kg (150 lb 6.4 oz).  LABS: CBC:    Component Value Date/Time   WBC 5.0 07/14/2021 1444   HGB 11.3 (L) 07/14/2021 1444   HGB 11.0 (L) 02/24/2021 1014   HCT 36.3 (L) 07/14/2021 1444   HCT 35.3 (L) 02/24/2021 1014   PLT 188 07/14/2021 1444   PLT 249 02/24/2021 1014   MCV 97.1 07/14/2021 1444   MCV 96 02/24/2021 1014   MCV 90 01/24/2013 0819   NEUTROABS 4.2 07/14/2021 1444   NEUTROABS 7.4 (H) 01/24/2013 0819   LYMPHSABS 0.6 (L) 07/14/2021 1444   LYMPHSABS 1.2 01/24/2013 0819    MONOABS 0.1 07/14/2021 1444   MONOABS 0.3 01/24/2013 0819   EOSABS 0.0 07/14/2021 1444   EOSABS 0.0 01/24/2013 0819   BASOSABS 0.0 07/14/2021 1444   BASOSABS 0.0 01/24/2013 0819   Comprehensive Metabolic Panel:    Component Value Date/Time   NA 135 07/14/2021 1444   NA 136 02/24/2021 1014   NA 138 01/24/2013 0819   K 4.5 07/14/2021 1444   K 3.8 01/24/2013 0819   CL 97 (L) 07/14/2021 1444   CL 105 01/24/2013 0819   CO2 28 07/14/2021 1444   CO2 25 01/24/2013 0819   BUN 33 (H) 07/14/2021 1444   BUN 23 02/24/2021 1014  BUN 20 (H) 01/24/2013 0819   CREATININE 1.08 07/14/2021 1444   CREATININE 1.52 (H) 01/24/2013 0819   GLUCOSE 209 (H) 07/14/2021 1444   GLUCOSE 171 (H) 01/24/2013 0819   CALCIUM 9.1 07/14/2021 1444   CALCIUM 10.4 (H) 02/19/2021 1229   AST 41 03/26/2021 1549   AST 18 01/24/2013 0819   ALT 25 03/26/2021 1549   ALT 15 01/24/2013 0819   ALKPHOS 104 03/26/2021 1549   ALKPHOS 74 01/24/2013 0819   BILITOT 1.4 (H) 03/26/2021 1549   BILITOT 0.4 02/24/2021 1014   BILITOT 0.5 01/24/2013 0819   PROT 6.0 (L) 03/26/2021 1549   PROT 6.2 02/24/2021 1014   PROT 6.3 (L) 01/24/2013 0819   ALBUMIN 3.8 03/26/2021 1549   ALBUMIN 4.5 02/24/2021 1014   ALBUMIN 3.6 01/24/2013 0819     Present during today's visit: Patient and his wife Jack [name]  Assessment and Plan: CBC/BMP reviewed, continue lenalidomide 25m 21on/7off and dexamethasone, today is C4D1   Oral Chemotherapy Side Effect/Intolerance:  No reported rash, N/V, diarrhea, or constipation. He continue to tolerate the treatment and overall much improved from pretreatment Most recently, he reports being able able to move around more, he is even walking his driveway  Other:  Reported right thigh pain, will continue to monitor, this may be muscle pain from the recent increase in activity   Oral Chemotherapy Adherence: no reported missed doses, they use the medication calendars to trach the treatment cycle. More  calendar month provided to take them until the end of February.  No patient barriers to medication adherence identified.   New medications: none reported  Medication Access Issues: no issues, fills Revlimid at [facility], medication on hand to start cycle today  Patient expressed understanding and was in agreement with this plan. He also understands that He can call clinic at any time with any questions, concerns, or complaints.   Follow-up plan: RTC in 4 weeks  Thank you for allowing me to participate in the care of this very pleasant patient.   Time Total: 20 mins  Visit consisted of counseling and education on dealing with issues of symptom management in the setting of serious and potentially life-threatening illness.Greater than 50%  of this time was spent counseling and coordinating care related to the above assessment and plan.  Signed by: ADarl Pikes PharmD, BCPS, BSalley Slaughter CPP Hematology/Oncology Clinical Pharmacist Practitioner [phone] 07/14/2021 4:38 PM

## 2021-07-14 ENCOUNTER — Inpatient Hospital Stay: Payer: PPO | Admitting: Pharmacist

## 2021-07-14 ENCOUNTER — Inpatient Hospital Stay: Payer: PPO | Admitting: Oncology

## 2021-07-14 ENCOUNTER — Other Ambulatory Visit: Payer: PPO

## 2021-07-14 ENCOUNTER — Inpatient Hospital Stay: Payer: PPO | Attending: Oncology

## 2021-07-14 ENCOUNTER — Ambulatory Visit: Payer: PPO | Admitting: Pharmacist

## 2021-07-14 ENCOUNTER — Ambulatory Visit: Payer: PPO | Admitting: Oncology

## 2021-07-14 ENCOUNTER — Other Ambulatory Visit: Payer: Self-pay

## 2021-07-14 VITALS — BP 122/67 | HR 58 | Temp 95.8°F | Wt 150.4 lb

## 2021-07-14 DIAGNOSIS — C9000 Multiple myeloma not having achieved remission: Secondary | ICD-10-CM | POA: Diagnosis not present

## 2021-07-14 DIAGNOSIS — R52 Pain, unspecified: Secondary | ICD-10-CM | POA: Diagnosis not present

## 2021-07-14 DIAGNOSIS — Z87891 Personal history of nicotine dependence: Secondary | ICD-10-CM | POA: Diagnosis not present

## 2021-07-14 DIAGNOSIS — D649 Anemia, unspecified: Secondary | ICD-10-CM | POA: Insufficient documentation

## 2021-07-14 LAB — BASIC METABOLIC PANEL
Anion gap: 10 (ref 5–15)
BUN: 33 mg/dL — ABNORMAL HIGH (ref 8–23)
CO2: 28 mmol/L (ref 22–32)
Calcium: 9.1 mg/dL (ref 8.9–10.3)
Chloride: 97 mmol/L — ABNORMAL LOW (ref 98–111)
Creatinine, Ser: 1.08 mg/dL (ref 0.61–1.24)
GFR, Estimated: >60 mL/min (ref >60)
Glucose, Bld: 209 mg/dL — ABNORMAL HIGH (ref 70–99)
Potassium: 4.5 mmol/L (ref 3.5–5.1)
Sodium: 135 mmol/L (ref 135–145)

## 2021-07-14 LAB — CBC WITH DIFFERENTIAL/PLATELET
Abs Immature Granulocytes: 0.02 10*3/uL (ref 0.00–0.07)
Basophils Absolute: 0 10*3/uL (ref 0.0–0.1)
Basophils Relative: 1 %
Eosinophils Absolute: 0 10*3/uL (ref 0.0–0.5)
Eosinophils Relative: 0 %
HCT: 36.3 % — ABNORMAL LOW (ref 39.0–52.0)
Hemoglobin: 11.3 g/dL — ABNORMAL LOW (ref 13.0–17.0)
Immature Granulocytes: 0 %
Lymphocytes Relative: 12 %
Lymphs Abs: 0.6 10*3/uL — ABNORMAL LOW (ref 0.7–4.0)
MCH: 30.2 pg (ref 26.0–34.0)
MCHC: 31.1 g/dL (ref 30.0–36.0)
MCV: 97.1 fL (ref 80.0–100.0)
Monocytes Absolute: 0.1 10*3/uL (ref 0.1–1.0)
Monocytes Relative: 2 %
Neutro Abs: 4.2 10*3/uL (ref 1.7–7.7)
Neutrophils Relative %: 85 %
Platelets: 188 10*3/uL (ref 150–400)
RBC: 3.74 MIL/uL — ABNORMAL LOW (ref 4.22–5.81)
RDW: 17.9 % — ABNORMAL HIGH (ref 11.5–15.5)
WBC: 5 10*3/uL (ref 4.0–10.5)
nRBC: 0 % (ref 0.0–0.2)

## 2021-07-14 NOTE — Progress Notes (Signed)
Pt c/o multiple pain sites including "bulge" on right side, left sided pain, right leg/thigh pain. Pt family wanting to discuss MRI results.

## 2021-07-15 LAB — KAPPA/LAMBDA LIGHT CHAINS
Kappa free light chain: 99.7 mg/L — ABNORMAL HIGH (ref 3.3–19.4)
Kappa, lambda light chain ratio: 11.73 — ABNORMAL HIGH (ref 0.26–1.65)
Lambda free light chains: 8.5 mg/L (ref 5.7–26.3)

## 2021-07-16 ENCOUNTER — Encounter: Payer: Self-pay | Admitting: Oncology

## 2021-07-16 ENCOUNTER — Encounter: Payer: Self-pay | Admitting: Internal Medicine

## 2021-07-21 LAB — IMMUNOGLOBULINS A/E/G/M, SERUM
IgA: 55 mg/dL — ABNORMAL LOW (ref 61–437)
IgE (Immunoglobulin E), Serum: <2 IU/mL — ABNORMAL LOW (ref 6–495)
IgG (Immunoglobin G), Serum: 467 mg/dL — ABNORMAL LOW (ref 603–1613)
IgM (Immunoglobulin M), Srm: 31 mg/dL (ref 15–143)

## 2021-07-27 ENCOUNTER — Other Ambulatory Visit: Payer: Self-pay | Admitting: Emergency Medicine

## 2021-07-27 ENCOUNTER — Encounter: Payer: Self-pay | Admitting: Oncology

## 2021-07-27 DIAGNOSIS — C9000 Multiple myeloma not having achieved remission: Secondary | ICD-10-CM

## 2021-07-27 DIAGNOSIS — H353231 Exudative age-related macular degeneration, bilateral, with active choroidal neovascularization: Secondary | ICD-10-CM | POA: Diagnosis not present

## 2021-07-27 DIAGNOSIS — H35033 Hypertensive retinopathy, bilateral: Secondary | ICD-10-CM | POA: Diagnosis not present

## 2021-07-27 DIAGNOSIS — H43812 Vitreous degeneration, left eye: Secondary | ICD-10-CM | POA: Diagnosis not present

## 2021-07-27 MED ORDER — OXYCODONE HCL ER 20 MG PO T12A: 20.0000 mg | EXTENDED_RELEASE_TABLET | Freq: Two times a day (BID) | ORAL | 0 refills | Status: DC

## 2021-07-29 ENCOUNTER — Other Ambulatory Visit: Payer: Self-pay | Admitting: Oncology

## 2021-07-29 ENCOUNTER — Inpatient Hospital Stay: Payer: PPO | Admitting: Hospice and Palliative Medicine

## 2021-07-29 DIAGNOSIS — C9000 Multiple myeloma not having achieved remission: Secondary | ICD-10-CM | POA: Diagnosis not present

## 2021-07-29 NOTE — Progress Notes (Signed)
Virtual Visit via video note  I connected with Jack [name] on 07/29/21 at 10:30 AM EST by a video enabled telemedicine application and verified that I am speaking with the correct person using two identifiers.  Location: Patient: Home Provider: Clinic   I discussed the limitations of evaluation and management by telemedicine and the availability of in person appointments. The patient expressed understanding and agreed to proceed.  History of Present Illness: Jack [name] is a 87 y.o. male with multiple medical problems including hypertension, CKD stage IIIb, A. fib on Eliquis, and anemia.  Patient was hospitalized 01/02/2021 to 01/07/2021 with T12 compression fracture with recommendation for conservative management including use of a TLSO brace.  Patient was hospitalized again 01/29/2021 to 02/07/2021 with recurrent back pain and MR showing T12 and new L1 compression fracture.  Patient underwent T12/L1 kyphoplasty on 02/02/2021.  There was also suspicion of a left fifth rib fracture, which occurred while transitioning from the stretcher for his kyphoplasty.  Patient had mild hypercalcemia with SPEP and UPEP concerning for possible myeloma.  Patient was referred to [facility] for work-up.  He was readmitted 03/11/2021 - 03/17/2021 with worsening back pain.  CT revealed new compression fractures of L2 and L3.  Patient underwent kyphoplasty on 6/30.  He was hospitalized again 03/26/2021-04/07/2021 with severe back/flank pain.  MRI revealed T10 pathologic compression fracture.  He was started on treatment for multiple myeloma with dexamethasone/Velcade and XRT to spine.  Palliative care was consulted to help address goals and manage ongoing symptoms  Observations/Objective: Video visit today with patient and wife.    Patient continues to improve.  He reportedly is more functional and now able to ambulate well around the house.  He is better able to provide for self-care.  Pain is stable.  Appetite  has improved and patient is slowly gaining weight.  No significant symptomatic issues at present.  Wife does say that patient is having leg myalgias, which she attributes to patient's atorvastatin.  Will defer to PCP for management.  Assessment and Plan: Multiple myeloma -on treatment with Revlimid/dexamethasone.  Appears to be tolerating treatments well without any adverse effects.  Followed by Dr. Grayland Ormond who patient will see later this month  Neoplasm related pain -continue OxyContin/oxycodone/gabapentin.     Follow Up Instructions: Follow-up MyChart visit 1 month   I discussed the assessment and treatment plan with the patient. The patient was provided an opportunity to ask questions and all were answered. The patient agreed with the plan and demonstrated an understanding of the instructions.   The patient was advised to call back or seek an in-person evaluation if the symptoms worsen or if the condition fails to improve as anticipated.  I provided 15 minutes of non-face-to-face time during this encounter.   Irean Hong, NP

## 2021-08-05 ENCOUNTER — Other Ambulatory Visit: Payer: Self-pay | Admitting: *Deleted

## 2021-08-05 ENCOUNTER — Encounter: Payer: Self-pay | Admitting: Pharmacist

## 2021-08-05 DIAGNOSIS — C9000 Multiple myeloma not having achieved remission: Secondary | ICD-10-CM

## 2021-08-05 MED ORDER — LENALIDOMIDE 25 MG PO CAPS: 25.0000 mg | ORAL_CAPSULE | Freq: Every day | ORAL | 0 refills | Status: DC

## 2021-08-09 ENCOUNTER — Encounter: Payer: Self-pay | Admitting: Hospice and Palliative Medicine

## 2021-08-09 ENCOUNTER — Encounter: Payer: Self-pay | Admitting: Oncology

## 2021-08-10 ENCOUNTER — Other Ambulatory Visit: Payer: Self-pay | Admitting: *Deleted

## 2021-08-10 DIAGNOSIS — J9601 Acute respiratory failure with hypoxia: Secondary | ICD-10-CM | POA: Diagnosis not present

## 2021-08-10 DIAGNOSIS — C9000 Multiple myeloma not having achieved remission: Secondary | ICD-10-CM | POA: Diagnosis not present

## 2021-08-10 DIAGNOSIS — M8440XA Pathological fracture, unspecified site, initial encounter for fracture: Secondary | ICD-10-CM | POA: Diagnosis not present

## 2021-08-10 DIAGNOSIS — S22000A Wedge compression fracture of unspecified thoracic vertebra, initial encounter for closed fracture: Secondary | ICD-10-CM | POA: Diagnosis not present

## 2021-08-10 NOTE — Progress Notes (Signed)
[place]  Telephone:(336) [phone] Fax:(336) [phone]  ID: [name] OB: [birth year]  MR#: [id number]  YFV#:[id number]  Patient Care Team: [name], MD as PCP - General (Family Medicine) End, [name], MD as PCP - Cardiology (Cardiology) [name], MD (Endocrinology)   CHIEF COMPLAINT: Stage II kappa chain myeloma.  INTERVAL HISTORY: Patient returns to clinic today for repeat laboratory work, further evaluation, and continuation of Revlimid and Xgeva.  His weakness and fatigue continue to improve.  His pain is now well controlled on his current narcotic regimen.  He has a good appetite and his weight is remained stable.  He continues to tolerate Revlimid well without significant side effects.  He has no neurologic complaints.  He denies any recent fevers or illnesses.  He has no chest pain, shortness of breath, cough, or hemoptysis.  He denies any nausea, vomiting, constipation, or diarrhea.  He has no urinary complaints.  Patient offers no further specific complaints today.  REVIEW OF SYSTEMS:   Review of Systems  Constitutional:  Positive for malaise/fatigue. Negative for fever.  Respiratory: Negative.  Negative for cough, hemoptysis and shortness of breath.   Cardiovascular: Negative.  Negative for chest pain and leg swelling.  Gastrointestinal: Negative.  Negative for abdominal pain.  Genitourinary:  Positive for flank pain. Negative for dysuria.  Musculoskeletal:  Positive for back pain. Negative for joint pain.  Skin: Negative.  Negative for rash.  Neurological:  Positive for weakness. Negative for dizziness, focal weakness and headaches.  Psychiatric/Behavioral: Negative.  The patient is not nervous/anxious.    As per HPI. Otherwise, a complete review of systems is negative.  PAST MEDICAL HISTORY: Past Medical History:  Diagnosis Date   Elevated prostate specific antigen (PSA)    has been 7 for a year    GERD (gastroesophageal  reflux disease)    History of colon polyps 2008   [facility],    History of kidney stones    Hyperlipidemia    Hypertension    Prostate hypertrophy    diagnosed at age 18 due to hematospermia   Renal disorder     PAST SURGICAL HISTORY: Past Surgical History:  Procedure Laterality Date   CATARACT EXTRACTION W/PHACO Left 01/10/2018   Procedure: CATARACT EXTRACTION PHACO AND INTRAOCULAR LENS PLACEMENT (Filer);  Surgeon: Birder Robson, MD;  Location: [facility] ORS;  Service: Ophthalmology;  Laterality: Left;  [name] 00:24.8 AP% 14.9 CDE 3.68 Fluid pack lot # [id number]   CATARACT EXTRACTION W/PHACO Right 01/25/2018   Procedure: CATARACT EXTRACTION PHACO AND INTRAOCULAR LENS PLACEMENT (IOC);  Surgeon: Birder Robson, MD;  Location: [facility] ORS;  Service: Ophthalmology;  Laterality: Right;  [name] 00:42 AP% 10.8 CDE 4.59 Fluid pack lot # [id number]   COLON SURGERY     CYSTOSCOPY W/ URETERAL STENT PLACEMENT Right 10/16/2017   Procedure: right  URETERAL STENT PLACEMENT,cystoscopy bilateral stent removal,rretrograde;  Surgeon: Abbie Sons, MD;  Location: [facility] ORS;  Service: Urology;  Laterality: Right;   CYSTOSCOPY/URETEROSCOPY/HOLMIUM LASER/STENT PLACEMENT Right 12/16/2020   Procedure: CYSTOSCOPY/URETEROSCOPY/HOLMIUM LASER/STENT PLACEMENT;  Surgeon: Abbie Sons, MD;  Location: [facility] ORS;  Service: Urology;  Laterality: Right;   EXTRACORPOREAL SHOCK WAVE LITHOTRIPSY Right 12/11/2020   Procedure: EXTRACORPOREAL SHOCK WAVE LITHOTRIPSY (ESWL);  Surgeon: Abbie Sons, MD;  Location: [facility] ORS;  Service: Urology;  Laterality: Right;   IR KYPHO EA ADDL LEVEL THORACIC OR LUMBAR  02/02/2021   IR KYPHO LUMBAR INC FX REDUCE BONE BX UNI/BIL CANNULATION INC/IMAGING  02/02/2021   KIDNEY STONE  SURGERY     KYPHOPLASTY N/A 03/12/2021   Procedure: Hewitt Shorts, L3;  Surgeon: Hessie Knows, MD;  Location: [facility] ORS;  Service: Orthopedics;  Laterality: N/A;   RESECTION SOFT TISSUE TUMOR LEG / ANKLE RADICAL  jan  2009   [facility],  right thigh/knee , nonmalignant   SMALL INTESTINE SURGERY  1946   implaed on picket fence, punctured stomach   TONSILLECTOMY      FAMILY HISTORY: Family History  Problem Relation Age of Onset   Hypertension Father    Hyperlipidemia Father    Cancer Sister        thyroid - dx in late 20's    ADVANCED DIRECTIVES (Y/N):  N  HEALTH MAINTENANCE: Social History   Tobacco Use   Smoking status: Former    Types: Cigarettes    Quit date: 07/05/1965    Years since quitting: 56.1   Smokeless tobacco: Never  Vaping Use   Vaping Use: Never used  Substance Use Topics   Alcohol use: Yes    Alcohol/week: 1.0 standard drink    Types: 1 Standard drinks or equivalent per week    Comment: occassionaly   Drug use: No     Colonoscopy:  PAP:  Bone density:  Lipid panel:  Allergies  Allergen Reactions   Quinolones     Aortic dilation contraindicates FQ   Amlodipine Other (See Comments)    LE edema   Azithromycin Nausea And Vomiting   Lisinopril Cough   Morphine And Related Nausea And Vomiting    Current Outpatient Medications  Medication Sig Dispense Refill   apixaban (ELIQUIS) 5 MG TABS tablet Take 1 tablet (5 mg total) by mouth 2 (two) times daily. Increase dose from 2.5 mg. 180 tablet 0   Calcium Carbonate-Vit D-Min (CALCIUM 1200 PO) Take by mouth.     carvedilol (COREG) 25 MG tablet Take 1 tablet (25 mg total) by mouth 2 (two) times daily with a meal. 60 tablet 1   dexamethasone (DECADRON) 4 MG tablet Take 5 tablets (20 mg total) by mouth once a week. 20 tablet 4   feeding supplement (ENSURE ENLIVE / ENSURE PLUS) LIQD Take 237 mLs by mouth 3 (three) times daily between meals. 237 mL 12   fluticasone (FLONASE) 50 MCG/ACT nasal spray Place into both nostrils daily.     gabapentin (NEURONTIN) 300 MG capsule Take 2 capsules (600 mg total) by mouth 3 (three) times daily. Can give equivalent if cheaper. 180 capsule 2   lenalidomide (REVLIMID) 25 MG capsule Take 1  capsule (25 mg total) by mouth daily. Take for 21 days, then hold for 7 days. Repeat every 28 days. 21 capsule 0   lidocaine (XYLOCAINE) 5 % ointment Apply topically 3 (three) times daily as needed for mild pain or moderate pain. 35.44 g 0   Multiple Vitamin (MULTIVITAMIN WITH MINERALS) TABS tablet Take 1 tablet by mouth daily.     Multiple Vitamins-Minerals (PRESERVISION AREDS PO) Take by mouth.     oxyCODONE (OXY IR/ROXICODONE) 5 MG immediate release tablet Take 1 tablet (5 mg total) by mouth every 4 (four) hours as needed for severe pain. 90 tablet 0   oxyCODONE (OXYCONTIN) 20 mg 12 hr tablet Take 1 tablet (20 mg total) by mouth every 12 (twelve) hours. 60 tablet 0   polyethylene glycol (MIRALAX / GLYCOLAX) 17 g packet Take 17 g by mouth 2 (two) times daily.  0   potassium citrate (UROCIT-K) 10 MEQ (1080 MG) SR tablet Take 1 tablet (10  mEq total) by mouth 3 (three) times daily with meals. 90 tablet 6   senna-docusate (SENOKOT-S) 8.6-50 MG tablet Take 1 tablet by mouth 2 (two) times daily. 30 tablet 0   tamsulosin (FLOMAX) 0.4 MG CAPS capsule TAKE 1 CAPSULE BY MOUTH EVERY DAY 90 capsule 1   valACYclovir (VALTREX) 500 MG tablet Take 500 mg by mouth 2 (two) times daily.     atorvastatin (LIPITOR) 40 MG tablet Take 1 tablet (40 mg total) by mouth daily at 6 PM. (Patient not taking: Reported on 08/11/2021) 90 tablet 3   calcitonin, salmon, (MIACALCIN/FORTICAL) 200 UNIT/ACT nasal spray Place 1 spray into alternate nostrils daily. (Patient not taking: Reported on 08/11/2021) 3.7 mL 12   docusate sodium (COLACE) 100 MG capsule Take 1 capsule (100 mg total) by mouth 2 (two) times daily. (Patient not taking: Reported on 08/11/2021) 60 capsule 0   naloxone (NARCAN) nasal spray 4 mg/0.1 mL SPRAY 1 SPRAY INTO ONE NOSTRIL AS DIRECTED FOR OPIOID OVERDOSE (TURN PERSON ON SIDE AFTER DOSE. IF NO RESPONSE IN 2-3 MINUTES OR PERSON RESPONDS BUT RELAPSES, REPEAT USING A NEW SPRAY DEVICE AND SPRAY INTO THE OTHER NOSTRIL.  CALL 911 AFTER USE.) * EMERGENCY USE ONLY * (Patient not taking: Reported on 08/11/2021) 1 each 0   ondansetron (ZOFRAN) 8 MG tablet Take 1 tablet (8 mg total) by mouth every 8 (eight) hours as needed for nausea or vomiting. (Patient not taking: Reported on 08/11/2021) 60 tablet 2   No current facility-administered medications for this visit.    OBJECTIVE: Vitals:   08/11/21 1525  BP: 125/69  Pulse: (!) 56  Resp: 18  Temp: (!) 97.2 F (36.2 C)  SpO2: 99%      Body mass index is 22.89 kg/m.    ECOG FS:1 - Symptomatic but completely ambulatory  General: Well-developed, well-nourished, no acute distress. Eyes: Pink conjunctiva, anicteric sclera. HEENT: Normocephalic, moist mucous membranes. Lungs: No audible wheezing or coughing. Heart: Regular rate and rhythm. Abdomen: Soft, nontender, no obvious distention. Musculoskeletal: No edema, cyanosis, or clubbing. Neuro: Alert, answering all questions appropriately. Cranial nerves grossly intact. Skin: No rashes or petechiae noted. Psych: Normal affect.   LAB RESULTS:  Lab Results  Component Value Date   NA 138 08/11/2021   K 4.4 08/11/2021   CL 104 08/11/2021   CO2 24 08/11/2021   GLUCOSE 181 (H) 08/11/2021   BUN 31 (H) 08/11/2021   CREATININE 1.23 08/11/2021   CALCIUM 9.0 08/11/2021   PROT 6.3 (L) 08/11/2021   ALBUMIN 3.8 08/11/2021   AST 26 08/11/2021   ALT 17 08/11/2021   ALKPHOS 131 (H) 08/11/2021   BILITOT 0.6 08/11/2021   GFRNONAA 57 (L) 08/11/2021   GFRAA 29 (L) 10/16/2017    Lab Results  Component Value Date   WBC 1.5 (L) 08/11/2021   NEUTROABS 0.8 (L) 08/11/2021   HGB 11.4 (L) 08/11/2021   HCT 36.5 (L) 08/11/2021   MCV 98.6 08/11/2021   PLT 168 08/11/2021     STUDIES: No results found.  ASSESSMENT: Stage II Kappa chain myeloma.  PLAN:    Stage II kappa chain myeloma: (11:14 translocation, high risk) SPEP essentially negative and immunoglobulins are within normal limits.  Patient's kappa light  chains have improved to greater than 5400 down to 99.7.  Today's result is pending.  Cycle 1 included Velcade and Revlimid, but given his declining performance status and difficulty to get into clinic this was transitioned to Revlimid only.  His performance status is improved and  he is now able to return to clinic for in person evaluation.  He is currently taking Revlimid 25 mg daily for 21 days, with 7 days off.  He is also taking 20 mg dexamethasone weekly.  Hold cycle 4 of Revlimid today secondary to neutropenia.  Return to clinic in 1 week for laboratory work.  If his neutropenia resolves, will continue with current dose of Revlimid.  If he remains neutropenic, we will consider dose reduction of Revlimid.  Patient will then return to clinic in 5 weeks for further evaluation and consideration of cycle 5 and Xgeva.  Appreciate palliative care and clinical pharmacy input.   Pain: Well controlled on current narcotic regimen and gabapentin.  Patient's wife reports he takes minimal short acting oxycodone. Anemia: Chronic and unchanged.  Patient's hemoglobin is 11.4 today.  Hemoglobin improved to 11.3. Right flank mass: MRI results from July 13, 2021 reviewed independently with no obvious pathology on his right flank, but did reveal several fractured ribs.  Continue narcotic regimen as above. Bone lesions: Proceed with Xgeva as above. Neutropenia: Hold treatment 1 week as above.  Patient expressed understanding and was in agreement with this plan. He also understands that He can call clinic at any time with any questions, concerns, or complaints.    Cancer Staging  Kappa light chain myeloma ([place]) Staging form: Plasma Cell Myeloma and Plasma Cell Disorders, AJCC 8th Edition - Clinical stage from 04/16/2021: Albumin (g/dL): 3.8, ISS: Stage II, High-risk cytogenetics: Absent, LDH: Unknown - Signed by Lloyd Huger, MD on 04/16/2021 Albumin range (g/dL): Greater than or equal to 3.5 Cytogenetics:  t(11;14) translocation Serum calcium level: Normal Serum creatinine level: Normal Bone disease on imaging: Present  Lloyd Huger, MD   08/12/2021 5:46 AM

## 2021-08-11 ENCOUNTER — Inpatient Hospital Stay: Payer: PPO | Admitting: Oncology

## 2021-08-11 ENCOUNTER — Inpatient Hospital Stay: Payer: PPO

## 2021-08-11 ENCOUNTER — Other Ambulatory Visit: Payer: Self-pay

## 2021-08-11 ENCOUNTER — Other Ambulatory Visit: Payer: Self-pay | Admitting: Emergency Medicine

## 2021-08-11 VITALS — BP 125/69 | HR 56 | Temp 97.2°F | Resp 18 | Wt 155.0 lb

## 2021-08-11 DIAGNOSIS — C9000 Multiple myeloma not having achieved remission: Secondary | ICD-10-CM

## 2021-08-11 LAB — COMPREHENSIVE METABOLIC PANEL
ALT: 17 U/L (ref 0–44)
AST: 26 U/L (ref 15–41)
Albumin: 3.8 g/dL (ref 3.5–5.0)
Alkaline Phosphatase: 131 U/L — ABNORMAL HIGH (ref 38–126)
Anion gap: 10 (ref 5–15)
BUN: 31 mg/dL — ABNORMAL HIGH (ref 8–23)
CO2: 24 mmol/L (ref 22–32)
Calcium: 9 mg/dL (ref 8.9–10.3)
Chloride: 104 mmol/L (ref 98–111)
Creatinine, Ser: 1.23 mg/dL (ref 0.61–1.24)
GFR, Estimated: 57 mL/min — ABNORMAL LOW (ref >60)
Glucose, Bld: 181 mg/dL — ABNORMAL HIGH (ref 70–99)
Potassium: 4.4 mmol/L (ref 3.5–5.1)
Sodium: 138 mmol/L (ref 135–145)
Total Bilirubin: 0.6 mg/dL (ref 0.3–1.2)
Total Protein: 6.3 g/dL — ABNORMAL LOW (ref 6.5–8.1)

## 2021-08-11 LAB — CBC WITH DIFFERENTIAL/PLATELET
Abs Immature Granulocytes: 0.01 10*3/uL (ref 0.00–0.07)
Basophils Absolute: 0 10*3/uL (ref 0.0–0.1)
Basophils Relative: 1 %
Eosinophils Absolute: 0 10*3/uL (ref 0.0–0.5)
Eosinophils Relative: 1 %
HCT: 36.5 % — ABNORMAL LOW (ref 39.0–52.0)
Hemoglobin: 11.4 g/dL — ABNORMAL LOW (ref 13.0–17.0)
Immature Granulocytes: 1 %
Lymphocytes Relative: 39 %
Lymphs Abs: 0.6 10*3/uL — ABNORMAL LOW (ref 0.7–4.0)
MCH: 30.8 pg (ref 26.0–34.0)
MCHC: 31.2 g/dL (ref 30.0–36.0)
MCV: 98.6 fL (ref 80.0–100.0)
Monocytes Absolute: 0.1 10*3/uL (ref 0.1–1.0)
Monocytes Relative: 4 %
Neutro Abs: 0.8 10*3/uL — ABNORMAL LOW (ref 1.7–7.7)
Neutrophils Relative %: 54 %
Platelets: 168 10*3/uL (ref 150–400)
RBC: 3.7 MIL/uL — ABNORMAL LOW (ref 4.22–5.81)
RDW: 18.8 % — ABNORMAL HIGH (ref 11.5–15.5)
WBC: 1.5 10*3/uL — ABNORMAL LOW (ref 4.0–10.5)
nRBC: 0 % (ref 0.0–0.2)

## 2021-08-11 MED ORDER — DENOSUMAB 120 MG/1.7ML ~~LOC~~ SOLN
120.0000 mg | Freq: Once | SUBCUTANEOUS | Status: AC
  Administered 2021-08-11: 120 mg via SUBCUTANEOUS

## 2021-08-12 ENCOUNTER — Encounter: Payer: Self-pay | Admitting: Internal Medicine

## 2021-08-12 ENCOUNTER — Encounter: Payer: Self-pay | Admitting: Oncology

## 2021-08-12 LAB — KAPPA/LAMBDA LIGHT CHAINS
Kappa free light chain: 86.9 mg/L — ABNORMAL HIGH (ref 3.3–19.4)
Kappa, lambda light chain ratio: 11 — ABNORMAL HIGH (ref 0.26–1.65)
Lambda free light chains: 7.9 mg/L (ref 5.7–26.3)

## 2021-08-13 ENCOUNTER — Other Ambulatory Visit: Payer: Self-pay | Admitting: *Deleted

## 2021-08-13 DIAGNOSIS — C9000 Multiple myeloma not having achieved remission: Secondary | ICD-10-CM

## 2021-08-13 LAB — IMMUNOGLOBULINS A/E/G/M, SERUM
IgA: 62 mg/dL (ref 61–437)
IgE (Immunoglobulin E), Serum: <2 IU/mL — ABNORMAL LOW (ref 6–495)
IgG (Immunoglobin G), Serum: 405 mg/dL — ABNORMAL LOW (ref 603–1613)
IgM (Immunoglobulin M), Srm: 28 mg/dL (ref 15–143)

## 2021-08-14 DIAGNOSIS — I10 Essential (primary) hypertension: Secondary | ICD-10-CM | POA: Diagnosis not present

## 2021-08-17 ENCOUNTER — Encounter: Payer: Self-pay | Admitting: Oncology

## 2021-08-18 ENCOUNTER — Inpatient Hospital Stay: Payer: PPO | Attending: Oncology

## 2021-08-18 ENCOUNTER — Other Ambulatory Visit: Payer: Self-pay

## 2021-08-18 DIAGNOSIS — C9000 Multiple myeloma not having achieved remission: Secondary | ICD-10-CM | POA: Insufficient documentation

## 2021-08-18 LAB — CBC WITH DIFFERENTIAL/PLATELET
Abs Immature Granulocytes: 0.01 K/uL (ref 0.00–0.07)
Basophils Absolute: 0 K/uL (ref 0.0–0.1)
Basophils Relative: 1 %
Eosinophils Absolute: 0 K/uL (ref 0.0–0.5)
Eosinophils Relative: 1 %
HCT: 35.4 % — ABNORMAL LOW (ref 39.0–52.0)
Hemoglobin: 11.1 g/dL — ABNORMAL LOW (ref 13.0–17.0)
Immature Granulocytes: 0 %
Lymphocytes Relative: 30 %
Lymphs Abs: 1 K/uL (ref 0.7–4.0)
MCH: 31.3 pg (ref 26.0–34.0)
MCHC: 31.4 g/dL (ref 30.0–36.0)
MCV: 99.7 fL (ref 80.0–100.0)
Monocytes Absolute: 0.5 K/uL (ref 0.1–1.0)
Monocytes Relative: 14 %
Neutro Abs: 1.8 K/uL (ref 1.7–7.7)
Neutrophils Relative %: 54 %
Platelets: 178 K/uL (ref 150–400)
RBC: 3.55 MIL/uL — ABNORMAL LOW (ref 4.22–5.81)
RDW: 19.2 % — ABNORMAL HIGH (ref 11.5–15.5)
WBC: 3.4 K/uL — ABNORMAL LOW (ref 4.0–10.5)
nRBC: 0 % (ref 0.0–0.2)

## 2021-08-19 ENCOUNTER — Other Ambulatory Visit: Payer: PPO

## 2021-08-19 ENCOUNTER — Other Ambulatory Visit: Payer: Self-pay | Admitting: Emergency Medicine

## 2021-08-19 ENCOUNTER — Encounter: Payer: Self-pay | Admitting: Oncology

## 2021-08-19 DIAGNOSIS — C9000 Multiple myeloma not having achieved remission: Secondary | ICD-10-CM

## 2021-08-19 DIAGNOSIS — Z515 Encounter for palliative care: Secondary | ICD-10-CM

## 2021-08-19 MED ORDER — OXYCODONE HCL 5 MG PO TABS: 5.0000 mg | ORAL_TABLET | ORAL | 0 refills | Status: DC | PRN

## 2021-08-19 MED ORDER — LIDOCAINE 5 % EX OINT: TOPICAL_OINTMENT | Freq: Three times a day (TID) | CUTANEOUS | 0 refills | Status: DC | PRN

## 2021-08-19 NOTE — Progress Notes (Signed)
149 pm.  Phone call made to wife [name] to complete a telephonic visit and re-schedule home visit with Natalia Leatherwood, NP.  Wife endorses patient is doing very well.  He has been able to go to several appointments to see the dentist and retina specialist.  Appetite has been good and weight is currently 155 lbs.  Weight gain of 5-6 lbs per wife. Pain is being well managed with gabapentin and oxycodone.  Wife noted WBC count was low last month so 5 th treatment occurred this month.  Next month, it is expected revlimid will be decreased to 20 mg.  Overall wife reports patient is doing well.  No new concerns or needs at this time.  Wife advised that scheduled visit with Christin Gusler, NP would need to be rescheduled.  Home visit is scheduled for next month.

## 2021-08-24 DIAGNOSIS — H353221 Exudative age-related macular degeneration, left eye, with active choroidal neovascularization: Secondary | ICD-10-CM | POA: Diagnosis not present

## 2021-08-24 DIAGNOSIS — H353211 Exudative age-related macular degeneration, right eye, with active choroidal neovascularization: Secondary | ICD-10-CM | POA: Diagnosis not present

## 2021-08-24 DIAGNOSIS — H353231 Exudative age-related macular degeneration, bilateral, with active choroidal neovascularization: Secondary | ICD-10-CM | POA: Diagnosis not present

## 2021-08-25 ENCOUNTER — Encounter: Payer: Self-pay | Admitting: Emergency Medicine

## 2021-08-25 ENCOUNTER — Encounter: Payer: Self-pay | Admitting: Oncology

## 2021-08-25 ENCOUNTER — Other Ambulatory Visit: Payer: Self-pay | Admitting: Emergency Medicine

## 2021-08-25 DIAGNOSIS — C9000 Multiple myeloma not having achieved remission: Secondary | ICD-10-CM

## 2021-08-25 MED ORDER — OXYCODONE HCL ER 20 MG PO T12A: 20.0000 mg | EXTENDED_RELEASE_TABLET | Freq: Two times a day (BID) | ORAL | 0 refills | Status: DC

## 2021-08-27 ENCOUNTER — Inpatient Hospital Stay (HOSPITAL_BASED_OUTPATIENT_CLINIC_OR_DEPARTMENT_OTHER): Payer: PPO | Admitting: Hospice and Palliative Medicine

## 2021-08-27 DIAGNOSIS — Z66 Do not resuscitate: Secondary | ICD-10-CM | POA: Diagnosis not present

## 2021-08-27 DIAGNOSIS — I480 Paroxysmal atrial fibrillation: Secondary | ICD-10-CM | POA: Diagnosis not present

## 2021-08-27 DIAGNOSIS — C9000 Multiple myeloma not having achieved remission: Secondary | ICD-10-CM | POA: Diagnosis not present

## 2021-08-27 DIAGNOSIS — G893 Neoplasm related pain (acute) (chronic): Secondary | ICD-10-CM | POA: Diagnosis not present

## 2021-08-27 DIAGNOSIS — Z515 Encounter for palliative care: Secondary | ICD-10-CM

## 2021-08-27 DIAGNOSIS — I10 Essential (primary) hypertension: Secondary | ICD-10-CM | POA: Diagnosis not present

## 2021-08-27 DIAGNOSIS — D6869 Other thrombophilia: Secondary | ICD-10-CM | POA: Diagnosis not present

## 2021-08-27 DIAGNOSIS — D692 Other nonthrombocytopenic purpura: Secondary | ICD-10-CM | POA: Diagnosis not present

## 2021-08-27 DIAGNOSIS — H353290 Exudative age-related macular degeneration, unspecified eye, stage unspecified: Secondary | ICD-10-CM | POA: Diagnosis not present

## 2021-08-27 NOTE — Progress Notes (Signed)
Virtual Visit via video note  I connected with Jack [name] on 08/27/21 at  2:15 PM EST by a video enabled telemedicine application and verified that I am speaking with the correct person using two identifiers.  Location: Patient: Home Provider: Clinic   I discussed the limitations of evaluation and management by telemedicine and the availability of in person appointments. The patient expressed understanding and agreed to proceed.  History of Present Illness: Jack [name] is a 87 y.o. male with multiple medical problems including hypertension, CKD stage IIIb, A. fib on Eliquis, and anemia.  Patient was hospitalized 01/02/2021 to 01/07/2021 with T12 compression fracture with recommendation for conservative management including use of a TLSO brace.  Patient was hospitalized again 01/29/2021 to 02/07/2021 with recurrent back pain and MR showing T12 and new L1 compression fracture.  Patient underwent T12/L1 kyphoplasty on 02/02/2021.  There was also suspicion of a left fifth rib fracture, which occurred while transitioning from the stretcher for his kyphoplasty.  Patient had mild hypercalcemia with SPEP and UPEP concerning for possible myeloma.  Patient was [facility] to [facility] for [facility]up.  He was readmitted 03/11/2021 - 03/17/2021 with worsening back pain.  CT revealed new compression fractures of L2 and L3.  Patient underwent kyphoplasty on 6/30.  He was hospitalized again 03/26/2021-04/07/2021 with severe back/flank pain.  MRI revealed T10 pathologic compression fracture.  He was started on treatment for multiple myeloma with dexamethasone/Velcade and XRT to spine.  Palliative care was consulted to help address goals and manage ongoing symptoms  Observations/Objective: Video visit today with patient and wife.    Patient is doing well.  He denies any significant changes or concerns.  He continues to have occasional pain but this is well controlled on current regimen of OxyContin/oxycodone.  No  issues with medications reported.  Appetite has improved and patient is gaining weight.  Performance status is also significantly better.  Patient does continue to have occasional myalgias.  Patient has started taking his statin every other day and finds that this has improved his symptoms.  Patient is awaiting word from his PCP regarding further management.  Assessment and Plan: Multiple myeloma -on treatment with Revlimid/dexamethasone.  Appears to be tolerating treatments well without any adverse effects.  Followed by Dr. Grayland Ormond who patient will see next month  Neoplasm related pain -continue OxyContin/oxycodone/gabapentin.     Follow Up Instructions: Follow-up MyChart visit 2 months   I discussed the assessment and treatment plan with the patient. The patient was provided an opportunity to ask questions and all were answered. The patient agreed with the plan and demonstrated an understanding of the instructions.   The patient was advised to call back or seek an in-person evaluation if the symptoms worsen or if the condition fails to improve as anticipated.  I provided 15 minutes of non-face-to-face time during this encounter.   Irean Hong, NP

## 2021-08-31 ENCOUNTER — Encounter: Payer: Self-pay | Admitting: Oncology

## 2021-08-31 ENCOUNTER — Other Ambulatory Visit: Payer: Self-pay | Admitting: Emergency Medicine

## 2021-08-31 DIAGNOSIS — C9000 Multiple myeloma not having achieved remission: Secondary | ICD-10-CM

## 2021-08-31 MED ORDER — CARVEDILOL 25 MG PO TABS: 25.0000 mg | ORAL_TABLET | Freq: Two times a day (BID) | ORAL | 1 refills | Status: DC

## 2021-09-01 ENCOUNTER — Other Ambulatory Visit: Payer: Self-pay | Admitting: *Deleted

## 2021-09-01 DIAGNOSIS — C9000 Multiple myeloma not having achieved remission: Secondary | ICD-10-CM

## 2021-09-01 MED ORDER — LENALIDOMIDE 25 MG PO CAPS: 25.0000 mg | ORAL_CAPSULE | Freq: Every day | ORAL | 0 refills | Status: DC

## 2021-09-03 ENCOUNTER — Other Ambulatory Visit: Payer: Self-pay | Admitting: Pharmacist

## 2021-09-03 DIAGNOSIS — C9000 Multiple myeloma not having achieved remission: Secondary | ICD-10-CM

## 2021-09-03 MED ORDER — LENALIDOMIDE 20 MG PO CAPS: 20.0000 mg | ORAL_CAPSULE | Freq: Every day | ORAL | 0 refills | Status: DC

## 2021-09-03 NOTE — Progress Notes (Signed)
Reviewed an email from [employer] stating "Jack [name] wife states that MD was going to reduce to 20mg . Can you all confirm the change in Revlimid Dosage? We currently have a RX for 25mg  and will need to update the RX for processing". MD did plan on decreased Revlimid to 20mg  with this coming cycle. Called [employer] to update REMs information and sent in updated RX to [employer].  Darl Pikes, PharmD, BCPS, BCOP, CPP Hematology/Oncology Clinical Pharmacist Practitioner [place]/DB/AP [facility] [phone]  09/03/2021 12:49 PM

## 2021-09-08 ENCOUNTER — Other Ambulatory Visit: Payer: Self-pay | Admitting: *Deleted

## 2021-09-08 DIAGNOSIS — C9000 Multiple myeloma not having achieved remission: Secondary | ICD-10-CM

## 2021-09-09 ENCOUNTER — Other Ambulatory Visit: Payer: Self-pay | Admitting: Oncology

## 2021-09-09 DIAGNOSIS — J9601 Acute respiratory failure with hypoxia: Secondary | ICD-10-CM | POA: Diagnosis not present

## 2021-09-09 DIAGNOSIS — S22000A Wedge compression fracture of unspecified thoracic vertebra, initial encounter for closed fracture: Secondary | ICD-10-CM | POA: Diagnosis not present

## 2021-09-09 DIAGNOSIS — C9000 Multiple myeloma not having achieved remission: Secondary | ICD-10-CM | POA: Diagnosis not present

## 2021-09-09 DIAGNOSIS — M8440XA Pathological fracture, unspecified site, initial encounter for fracture: Secondary | ICD-10-CM | POA: Diagnosis not present

## 2021-09-10 ENCOUNTER — Other Ambulatory Visit: Payer: Self-pay | Admitting: Oncology

## 2021-09-10 DIAGNOSIS — C9000 Multiple myeloma not having achieved remission: Secondary | ICD-10-CM

## 2021-09-10 NOTE — Progress Notes (Signed)
[place]  Telephone:(336) [phone] Fax:(336) [phone]  ID: Jack [name] OB: Sep 07, 1933  MR#: [id number]  LXB#:[id number]  Patient Care Team: [name], MD as PCP - General (Family Medicine) End, [name], MD as PCP - Cardiology (Cardiology) [name], MD (Endocrinology)   CHIEF COMPLAINT: Stage II kappa chain myeloma.  INTERVAL HISTORY: Patient returns to clinic today for repeat laboratory work, further evaluation, and continuation of dose reduced Revlimid and Xgeva.  His weakness and fatigue continue to improve.  He has noticed increased back pain, but this is possibly musculoskeletal secondary to his increased activity.  He has a good appetite and his weight is remained stable.  He continues to tolerate Revlimid well without significant side effects.  He has no neurologic complaints.  He denies any recent fevers or illnesses.  He has no chest pain, shortness of breath, cough, or hemoptysis.  He denies any nausea, vomiting, constipation, or diarrhea.  He has no urinary complaints.  Patient offers no further specific complaints today.  REVIEW OF SYSTEMS:   Review of Systems  Constitutional:  Positive for malaise/fatigue. Negative for fever.  Respiratory: Negative.  Negative for cough, hemoptysis and shortness of breath.   Cardiovascular: Negative.  Negative for chest pain and leg swelling.  Gastrointestinal: Negative.  Negative for abdominal pain.  Genitourinary: Negative.  Negative for dysuria and flank pain.  Musculoskeletal:  Positive for back pain. Negative for joint pain.  Skin: Negative.  Negative for rash.  Neurological:  Positive for weakness. Negative for dizziness, focal weakness and headaches.  Psychiatric/Behavioral: Negative.  The patient is not nervous/anxious.    As per HPI. Otherwise, a complete review of systems is negative.  PAST MEDICAL HISTORY: Past Medical History:  Diagnosis Date   Elevated prostate specific antigen  (PSA)    has been 7 for a year    GERD (gastroesophageal reflux disease)    History of colon polyps 2008   [facility],    History of kidney stones    Hyperlipidemia    Hypertension    Prostate hypertrophy    diagnosed at age 58 due to hematospermia   Renal disorder     PAST SURGICAL HISTORY: Past Surgical History:  Procedure Laterality Date   CATARACT EXTRACTION W/PHACO Left 01/10/2018   Procedure: CATARACT EXTRACTION PHACO AND INTRAOCULAR LENS PLACEMENT ([place]);  Surgeon: Birder Robson, MD;  Location: [facility] ORS;  Service: Ophthalmology;  Laterality: Left;  [name] 00:24.8 AP% 14.9 CDE 3.68 Fluid pack lot # [id number]   CATARACT EXTRACTION W/PHACO Right 01/25/2018   Procedure: CATARACT EXTRACTION PHACO AND INTRAOCULAR LENS PLACEMENT ([facility]);  Surgeon: Birder Robson, MD;  Location: [facility] ORS;  Service: Ophthalmology;  Laterality: Right;  [name] 00:42 AP% 10.8 CDE 4.59 Fluid pack lot # [id number]   COLON SURGERY     CYSTOSCOPY W/ URETERAL STENT PLACEMENT Right 10/16/2017   Procedure: right  URETERAL STENT PLACEMENT,cystoscopy bilateral stent removal,rretrograde;  Surgeon: Abbie Sons, MD;  Location: [facility] ORS;  Service: Urology;  Laterality: Right;   CYSTOSCOPY/URETEROSCOPY/HOLMIUM LASER/STENT PLACEMENT Right 12/16/2020   Procedure: CYSTOSCOPY/URETEROSCOPY/HOLMIUM LASER/STENT PLACEMENT;  Surgeon: Abbie Sons, MD;  Location: [facility] ORS;  Service: Urology;  Laterality: Right;   EXTRACORPOREAL SHOCK WAVE LITHOTRIPSY Right 12/11/2020   Procedure: EXTRACORPOREAL SHOCK WAVE LITHOTRIPSY (ESWL);  Surgeon: Abbie Sons, MD;  Location: [facility] ORS;  Service: Urology;  Laterality: Right;   IR KYPHO EA ADDL LEVEL THORACIC OR LUMBAR  02/02/2021   IR KYPHO LUMBAR INC FX REDUCE BONE BX UNI/BIL CANNULATION  INC/IMAGING  02/02/2021   KIDNEY STONE SURGERY     KYPHOPLASTY N/A 03/12/2021   Procedure: [place]itt Shorts, L3;  Surgeon: Hessie Knows, MD;  Location: [facility] ORS;  Service: Orthopedics;  Laterality: N/A;    RESECTION SOFT TISSUE TUMOR LEG / ANKLE RADICAL  jan 2009   [facility],  right thigh/knee , nonmalignant   SMALL INTESTINE SURGERY  1946   implaed on picket fence, punctured stomach   TONSILLECTOMY      FAMILY HISTORY: Family History  Problem Relation Age of Onset   Hypertension Father    Hyperlipidemia Father    Cancer Sister        thyroid - dx in late 20's    ADVANCED DIRECTIVES (Y/N):  N  HEALTH MAINTENANCE: Social History   Tobacco Use   Smoking status: Former    Types: Cigarettes    Quit date: 07/05/1965    Years since quitting: 56.2   Smokeless tobacco: Never  Vaping Use   Vaping Use: Never used  Substance Use Topics   Alcohol use: Yes    Alcohol/week: 1.0 standard drink    Types: 1 Standard drinks or equivalent per week    Comment: occassionaly   Drug use: No     Colonoscopy:  PAP:  Bone density:  Lipid panel:  Allergies  Allergen Reactions   Quinolones     Aortic dilation contraindicates FQ   Amlodipine Other (See Comments)    LE edema   Azithromycin Nausea And Vomiting   Lisinopril Cough   Morphine And Related Nausea And Vomiting    Current Outpatient Medications  Medication Sig Dispense Refill   apixaban (ELIQUIS) 5 MG TABS tablet Take 1 tablet (5 mg total) by mouth 2 (two) times daily. Increase dose from 2.5 mg. 180 tablet 0   atorvastatin (LIPITOR) 40 MG tablet Take 1 tablet (40 mg total) by mouth daily at 6 PM. (Patient taking differently: Take 40 mg by mouth. 2 times a week) 90 tablet 3   Calcium Carbonate-Vit D-Min (CALCIUM 1200 PO) Take by mouth.     carvedilol (COREG) 25 MG tablet Take 1 tablet (25 mg total) by mouth 2 (two) times daily with a meal. 60 tablet 1   dexamethasone (DECADRON) 4 MG tablet TAKE 5 TABLETS BY MOUTH ONCE A WEEK. 20 tablet 4   docusate sodium (COLACE) 100 MG capsule Take 1 capsule (100 mg total) by mouth 2 (two) times daily. 60 capsule 0   feeding supplement (ENSURE ENLIVE / ENSURE PLUS) LIQD Take 237 mLs by mouth 3  (three) times daily between meals. 237 mL 12   fluticasone (FLONASE) 50 MCG/ACT nasal spray Place into both nostrils daily.     gabapentin (NEURONTIN) 300 MG capsule Take 2 capsules (600 mg total) by mouth 3 (three) times daily. Can give equivalent if cheaper. 180 capsule 2   lenalidomide (REVLIMID) 20 MG capsule Take 1 capsule (20 mg total) by mouth daily. Take for 21 days, then hold for 7 days. Repeat every 28 days. 21 capsule 0   lidocaine (XYLOCAINE) 5 % ointment Apply topically 3 (three) times daily as needed for mild pain or moderate pain. 35.44 g 0   Multiple Vitamin (MULTIVITAMIN WITH MINERALS) TABS tablet Take 1 tablet by mouth daily.     Multiple Vitamins-Minerals (PRESERVISION AREDS PO) Take by mouth.     ondansetron (ZOFRAN) 8 MG tablet Take 1 tablet (8 mg total) by mouth every 8 (eight) hours as needed for nausea or vomiting. 60 tablet 2  oxyCODONE (OXY IR/ROXICODONE) 5 MG immediate release tablet Take 1 tablet (5 mg total) by mouth every 4 (four) hours as needed for severe pain. 90 tablet 0   oxyCODONE (OXYCONTIN) 20 mg 12 hr tablet Take 1 tablet (20 mg total) by mouth every 12 (twelve) hours. 60 tablet 0   polyethylene glycol (MIRALAX / GLYCOLAX) 17 g packet Take 17 g by mouth 2 (two) times daily.  0   potassium citrate (UROCIT-K) 10 MEQ (1080 MG) SR tablet Take 1 tablet (10 mEq total) by mouth 3 (three) times daily with meals. 90 tablet 6   senna-docusate (SENOKOT-S) 8.6-50 MG tablet Take 1 tablet by mouth 2 (two) times daily. 30 tablet 0   tamsulosin (FLOMAX) 0.4 MG CAPS capsule TAKE 1 CAPSULE BY MOUTH EVERY DAY 90 capsule 1   valACYclovir (VALTREX) 500 MG tablet Take 500 mg by mouth 2 (two) times daily.     calcitonin, salmon, (MIACALCIN/FORTICAL) 200 UNIT/ACT nasal spray Place 1 spray into alternate nostrils daily. (Patient not taking: Reported on 08/11/2021) 3.7 mL 12   naloxone (NARCAN) nasal spray 4 mg/0.1 mL SPRAY 1 SPRAY INTO ONE NOSTRIL AS DIRECTED FOR OPIOID OVERDOSE (TURN  PERSON ON SIDE AFTER DOSE. IF NO RESPONSE IN 2-3 MINUTES OR PERSON RESPONDS BUT RELAPSES, REPEAT USING A NEW SPRAY DEVICE AND SPRAY INTO THE OTHER NOSTRIL. CALL 911 AFTER USE.) * EMERGENCY USE ONLY * (Patient not taking: Reported on 08/11/2021) 1 each 0   No current facility-administered medications for this visit.    OBJECTIVE: Vitals:   09/15/21 1259  BP: 133/76  Pulse: (!) 43  Resp: 16  Temp: (!) 96.5 F (35.8 C)  SpO2: 99%      Body mass index is 23.92 kg/m.    ECOG FS:1 - Symptomatic but completely ambulatory  General: Well-developed, well-nourished, no acute distress. Eyes: Pink conjunctiva, anicteric sclera. HEENT: Normocephalic, moist mucous membranes. Lungs: No audible wheezing or coughing. Heart: Regular rate and rhythm. Abdomen: Soft, nontender, no obvious distention. Musculoskeletal: No edema, cyanosis, or clubbing. Neuro: Alert, answering all questions appropriately. Cranial nerves grossly intact. Skin: No rashes or petechiae noted. Psych: Normal affect.   LAB RESULTS:  Lab Results  Component Value Date   NA 137 09/15/2021   K 4.2 09/15/2021   CL 102 09/15/2021   CO2 27 09/15/2021   GLUCOSE 101 (H) 09/15/2021   BUN 27 (H) 09/15/2021   CREATININE 1.12 09/15/2021   CALCIUM 8.8 (L) 09/15/2021   PROT 6.3 (L) 08/11/2021   ALBUMIN 3.8 08/11/2021   AST 26 08/11/2021   ALT 17 08/11/2021   ALKPHOS 131 (H) 08/11/2021   BILITOT 0.6 08/11/2021   GFRNONAA >60 09/15/2021   GFRAA 29 (L) 10/16/2017    Lab Results  Component Value Date   WBC 5.9 09/15/2021   NEUTROABS 1.8 08/18/2021   HGB 11.5 (L) 09/15/2021   HCT 36.6 (L) 09/15/2021   MCV 100.0 09/15/2021   PLT 193 09/15/2021     STUDIES: No results found.  ASSESSMENT: Stage II Kappa chain myeloma.  PLAN:    Stage II kappa chain myeloma: (11:14 translocation, high risk) SPEP essentially negative and immunoglobulins are within normal limits.  Patient's kappa light chains have improved to greater than  5400 down to 86.9.  Today's result is pending.  Cycle 1 included Velcade and Revlimid, but given his declining performance status and difficulty to get into clinic this was transitioned to Revlimid only.  His performance status is improved and he is now able to  return to clinic for in person evaluation.  Given persistent neutropenia, Revlimid has been dose reduced to 20 mg daily for 21 days with 7 days off.  He is also taking 20 mg dexamethasone weekly.  Proceed with Xgeva today.  Return to clinic in 4 weeks for further evaluation and continuation of treatment.  Appreciate palliative care and clinical pharmacy input.   Pain: Slightly worse today, but may be related to patient's increased activity.  Continue current narcotic regimen and gabapentin.  Patient's wife reports he takes minimal short acting oxycodone. Anemia: Chronic and unchanged.  Patient's hemoglobin is 11.5 today. Right flank mass: MRI results from July 13, 2021 reviewed independently with no obvious pathology on his right flank, but did reveal several fractured ribs.  Continue narcotic regimen as above. Bone lesions: Proceed with Xgeva as above.   Neutropenia: Resolved with dose reduction of Revlimid.  Patient expressed understanding and was in agreement with this plan. He also understands that He can call clinic at any time with any questions, concerns, or complaints.    Cancer Staging  Kappa light chain myeloma ([place]) Staging form: Plasma Cell Myeloma and Plasma Cell Disorders, AJCC 8th Edition - Clinical stage from 04/16/2021: Albumin (g/dL): 3.8, ISS: Stage II, High-risk cytogenetics: Absent, LDH: Unknown - Signed by Lloyd Huger, MD on 04/16/2021 Albumin range (g/dL): Greater than or equal to 3.5 Cytogenetics: t(11;14) translocation Serum calcium level: Normal Serum creatinine level: Normal Bone disease on imaging: Present  Lloyd Huger, MD   09/15/2021 7:30 PM

## 2021-09-14 ENCOUNTER — Encounter: Payer: Self-pay | Admitting: Pharmacist

## 2021-09-14 ENCOUNTER — Encounter: Payer: Self-pay | Admitting: Hospice and Palliative Medicine

## 2021-09-15 ENCOUNTER — Other Ambulatory Visit: Payer: Self-pay

## 2021-09-15 ENCOUNTER — Inpatient Hospital Stay: Payer: PPO

## 2021-09-15 ENCOUNTER — Inpatient Hospital Stay: Payer: PPO | Attending: Oncology

## 2021-09-15 ENCOUNTER — Encounter: Payer: Self-pay | Admitting: Oncology

## 2021-09-15 ENCOUNTER — Inpatient Hospital Stay: Payer: PPO | Admitting: Oncology

## 2021-09-15 ENCOUNTER — Inpatient Hospital Stay: Payer: PPO | Admitting: Pharmacist

## 2021-09-15 VITALS — BP 133/76 | HR 43 | Temp 96.5°F | Resp 16 | Ht 69.0 in | Wt 162.0 lb

## 2021-09-15 DIAGNOSIS — C9000 Multiple myeloma not having achieved remission: Secondary | ICD-10-CM

## 2021-09-15 LAB — BASIC METABOLIC PANEL
Anion gap: 8 (ref 5–15)
BUN: 27 mg/dL — ABNORMAL HIGH (ref 8–23)
CO2: 27 mmol/L (ref 22–32)
Calcium: 8.8 mg/dL — ABNORMAL LOW (ref 8.9–10.3)
Chloride: 102 mmol/L (ref 98–111)
Creatinine, Ser: 1.12 mg/dL (ref 0.61–1.24)
GFR, Estimated: >60 mL/min (ref >60)
Glucose, Bld: 101 mg/dL — ABNORMAL HIGH (ref 70–99)
Potassium: 4.2 mmol/L (ref 3.5–5.1)
Sodium: 137 mmol/L (ref 135–145)

## 2021-09-15 LAB — CBC
HCT: 36.6 % — ABNORMAL LOW (ref 39.0–52.0)
Hemoglobin: 11.5 g/dL — ABNORMAL LOW (ref 13.0–17.0)
MCH: 31.4 pg (ref 26.0–34.0)
MCHC: 31.4 g/dL (ref 30.0–36.0)
MCV: 100 fL (ref 80.0–100.0)
Platelets: 193 10*3/uL (ref 150–400)
RBC: 3.66 MIL/uL — ABNORMAL LOW (ref 4.22–5.81)
RDW: 17.5 % — ABNORMAL HIGH (ref 11.5–15.5)
WBC: 5.9 10*3/uL (ref 4.0–10.5)
nRBC: 0 % (ref 0.0–0.2)

## 2021-09-15 MED ORDER — DENOSUMAB 120 MG/1.7ML ~~LOC~~ SOLN
120.0000 mg | Freq: Once | SUBCUTANEOUS | Status: AC
  Administered 2021-09-15: 120 mg via SUBCUTANEOUS
  Filled 2021-09-15: qty 1.7

## 2021-09-15 NOTE — Progress Notes (Signed)
[place]  Telephone:([phone] [phone]  Patient Care Team: [name], MD as PCP - General (Family Medicine) End, [name], MD as PCP - Cardiology (Cardiology) [name], MD (Endocrinology)   Name of the patient: Jack [name]  [id number]  1933/04/09   Date of visit: 09/15/21  HPI: Patient is a 88 y.o. male with newly diagnosed multiple myeloma. Currently treated with Revlimid (lenalidomide) and dexamethasone. Patient was initiated on an all oral regimen because he was unable to physically make it into clinic at the time treatment due to his disease/performance status. His status improved he was able to come back to inperson appts on 07/14/21.  Reason for Consult: Oral chemotherapy follow-up for lenalidomide therapy.   PAST MEDICAL HISTORY: Past Medical History:  Diagnosis Date   Elevated prostate specific antigen (PSA)    has been 7 for a year    GERD (gastroesophageal reflux disease)    History of colon polyps 2008   [facility],    History of kidney stones    Hyperlipidemia    Hypertension    Prostate hypertrophy    diagnosed at age 81 due to hematospermia   Renal disorder     HEMATOLOGY/ONCOLOGY HISTORY:  Oncology History  Kappa light chain myeloma ([place])  03/12/2021 Initial Diagnosis   Kappa light chain myeloma ([place])   03/27/2021 -  Chemotherapy    Patient is on Treatment Plan: MYELOMA NON-TRANSPLANT CANDIDATES VRD SQ Q21D        04/16/2021 Cancer Staging   Staging form: Plasma Cell Myeloma and Plasma Cell Disorders, AJCC 8th Edition - Clinical stage from 04/16/2021: Albumin (g/dL): 3.8, ISS: Stage II, High-risk cytogenetics: Absent, LDH: Unknown - Signed by Lloyd Huger, MD on 04/16/2021 Albumin range (g/dL): Greater than or equal to 3.5 Cytogenetics: t(11;14) translocation Serum calcium level: Normal Serum creatinine level: Normal Bone disease on imaging: Present       ALLERGIES:  is allergic to quinolones, amlodipine, azithromycin, lisinopril, and morphine and related.  MEDICATIONS:  Current Outpatient Medications  Medication Sig Dispense Refill   apixaban (ELIQUIS) 5 MG TABS tablet Take 1 tablet (5 mg total) by mouth 2 (two) times daily. Increase dose from 2.5 mg. 180 tablet 0   atorvastatin (LIPITOR) 40 MG tablet Take 1 tablet (40 mg total) by mouth daily at 6 PM. (Patient not taking: Reported on 08/11/2021) 90 tablet 3   calcitonin, salmon, (MIACALCIN/FORTICAL) 200 UNIT/ACT nasal spray Place 1 spray into alternate nostrils daily. (Patient not taking: Reported on 08/11/2021) 3.7 mL 12   Calcium Carbonate-Vit D-Min (CALCIUM 1200 PO) Take by mouth.     carvedilol (COREG) 25 MG tablet Take 1 tablet (25 mg total) by mouth 2 (two) times daily with a meal. 60 tablet 1   dexamethasone (DECADRON) 4 MG tablet TAKE 5 TABLETS BY MOUTH ONCE A WEEK. 20 tablet 4   docusate sodium (COLACE) 100 MG capsule Take 1 capsule (100 mg total) by mouth 2 (two) times daily. (Patient not taking: Reported on 08/11/2021) 60 capsule 0   feeding supplement (ENSURE ENLIVE / ENSURE PLUS) LIQD Take 237 mLs by mouth 3 (three) times daily between meals. 237 mL 12   fluticasone (FLONASE) 50 MCG/ACT nasal spray Place into both nostrils daily.     gabapentin (NEURONTIN) 300 MG capsule Take 2 capsules (600 mg total) by mouth 3 (three) times daily. Can give equivalent if cheaper. 180 capsule 2   lenalidomide (REVLIMID) 20 MG capsule Take 1 capsule (20  mg total) by mouth daily. Take for 21 days, then hold for 7 days. Repeat every 28 days. 21 capsule 0   lidocaine (XYLOCAINE) 5 % ointment Apply topically 3 (three) times daily as needed for mild pain or moderate pain. 35.44 g 0   Multiple Vitamin (MULTIVITAMIN WITH MINERALS) TABS tablet Take 1 tablet by mouth daily.     Multiple Vitamins-Minerals (PRESERVISION AREDS PO) Take by mouth.     naloxone (NARCAN) nasal spray 4 mg/0.1 mL SPRAY 1 SPRAY  INTO ONE NOSTRIL AS DIRECTED FOR OPIOID OVERDOSE (TURN PERSON ON SIDE AFTER DOSE. IF NO RESPONSE IN 2-3 MINUTES OR PERSON RESPONDS BUT RELAPSES, REPEAT USING A NEW SPRAY DEVICE AND SPRAY INTO THE OTHER NOSTRIL. CALL 911 AFTER USE.) * EMERGENCY USE ONLY * (Patient not taking: Reported on 08/11/2021) 1 each 0   ondansetron (ZOFRAN) 8 MG tablet Take 1 tablet (8 mg total) by mouth every 8 (eight) hours as needed for nausea or vomiting. (Patient not taking: Reported on 08/11/2021) 60 tablet 2   oxyCODONE (OXY IR/ROXICODONE) 5 MG immediate release tablet Take 1 tablet (5 mg total) by mouth every 4 (four) hours as needed for severe pain. 90 tablet 0   oxyCODONE (OXYCONTIN) 20 mg 12 hr tablet Take 1 tablet (20 mg total) by mouth every 12 (twelve) hours. 60 tablet 0   polyethylene glycol (MIRALAX / GLYCOLAX) 17 g packet Take 17 g by mouth 2 (two) times daily.  0   potassium citrate (UROCIT-K) 10 MEQ (1080 MG) SR tablet Take 1 tablet (10 mEq total) by mouth 3 (three) times daily with meals. 90 tablet 6   senna-docusate (SENOKOT-S) 8.6-50 MG tablet Take 1 tablet by mouth 2 (two) times daily. 30 tablet 0   tamsulosin (FLOMAX) 0.4 MG CAPS capsule TAKE 1 CAPSULE BY MOUTH EVERY DAY 90 capsule 1   valACYclovir (VALTREX) 500 MG tablet Take 500 mg by mouth 2 (two) times daily.     No current facility-administered medications for this visit.    VITAL SIGNS: There were no vitals taken for this visit. There were no vitals filed for this visit.  Estimated body mass index is 23.92 kg/m as calculated from the following:   Height as of an earlier encounter on 09/15/21: '5\' 9"'  (1.753 m).   Weight as of an earlier encounter on 09/15/21: 73.5 kg (162 lb).  LABS: CBC:    Component Value Date/Time   WBC 5.9 09/15/2021 1234   HGB 11.5 (L) 09/15/2021 1234   HGB 11.0 (L) 02/24/2021 1014   HCT 36.6 (L) 09/15/2021 1234   HCT 35.3 (L) 02/24/2021 1014   PLT 193 09/15/2021 1234   PLT 249 02/24/2021 1014   MCV 100.0 09/15/2021  1234   MCV 96 02/24/2021 1014   MCV 90 01/24/2013 0819   NEUTROABS 1.8 08/18/2021 1127   NEUTROABS 7.4 (H) 01/24/2013 0819   LYMPHSABS 1.0 08/18/2021 1127   LYMPHSABS 1.2 01/24/2013 0819   MONOABS 0.5 08/18/2021 1127   MONOABS 0.3 01/24/2013 0819   EOSABS 0.0 08/18/2021 1127   EOSABS 0.0 01/24/2013 0819   BASOSABS 0.0 08/18/2021 1127   BASOSABS 0.0 01/24/2013 0819   Comprehensive Metabolic Panel:    Component Value Date/Time   NA 137 09/15/2021 1234   NA 136 02/24/2021 1014   NA 138 01/24/2013 0819   K 4.2 09/15/2021 1234   K 3.8 01/24/2013 0819   CL 102 09/15/2021 1234   CL 105 01/24/2013 0819   CO2 27 09/15/2021 1234  CO2 25 01/24/2013 0819   BUN 27 (H) 09/15/2021 1234   BUN 23 02/24/2021 1014   BUN 20 (H) 01/24/2013 0819   CREATININE 1.12 09/15/2021 1234   CREATININE 1.52 (H) 01/24/2013 0819   GLUCOSE 101 (H) 09/15/2021 1234   GLUCOSE 171 (H) 01/24/2013 0819   CALCIUM 8.8 (L) 09/15/2021 1234   CALCIUM 10.4 (H) 02/19/2021 1229   AST 26 08/11/2021 1506   AST 18 01/24/2013 0819   ALT 17 08/11/2021 1506   ALT 15 01/24/2013 0819   ALKPHOS 131 (H) 08/11/2021 1506   ALKPHOS 74 01/24/2013 0819   BILITOT 0.6 08/11/2021 1506   BILITOT 0.4 02/24/2021 1014   BILITOT 0.5 01/24/2013 0819   PROT 6.3 (L) 08/11/2021 1506   PROT 6.2 02/24/2021 1014   PROT 6.3 (L) 01/24/2013 0819   ALBUMIN 3.8 08/11/2021 1506   ALBUMIN 4.5 02/24/2021 1014   ALBUMIN 3.6 01/24/2013 0819     Present during today's visit: Patient and his wife Jack [name]  Assessment and Plan: CBC/BMP reviewed, continue lenalidomide but dose reduced to 77m with this coming cycle due to previous neutropenia on 222m Patient will resume lenalidomide tomorrow 09/16/21.   Patient provided with updated medication calendar.    Oral Chemotherapy Side Effect/Intolerance:  No reported rash, N/V, diarrhea, or constipation. He continue to tolerate the treatment and overall much improved from pretreatment He is able to walk  the loop in front of his house which is about 1/3 mile and now able to preform ADLs independently.   Oral Chemotherapy Adherence: no reported missed doses No patient barriers to medication adherence identified.   New medications: none reported  Medication Access Issues: no issues, fills Revlimid at [facility], medication on hand to start cycle tomorrow  Patient expressed understanding and was in agreement with this plan. He also understands that He can call clinic at any time with any questions, concerns, or complaints.   Follow-up plan: RTC in 4 weeks  Thank you for allowing me to participate in the care of this very pleasant patient.   Time Total: 15 mins  Visit consisted of counseling and education on dealing with issues of symptom management in the setting of serious and potentially life-threatening illness.Greater than 50%  of this time was spent counseling and coordinating care related to the above assessment and plan.  Signed by: AlDarl PikesPharmD, BCPS, BCSalley SlaughterCPP Hematology/Oncology Clinical Pharmacist Practitioner [place]/DB/AP [phone]/11/2021 1:04 PM

## 2021-09-16 DIAGNOSIS — C9000 Multiple myeloma not having achieved remission: Secondary | ICD-10-CM | POA: Diagnosis not present

## 2021-09-16 DIAGNOSIS — H353290 Exudative age-related macular degeneration, unspecified eye, stage unspecified: Secondary | ICD-10-CM | POA: Diagnosis not present

## 2021-09-16 DIAGNOSIS — I10 Essential (primary) hypertension: Secondary | ICD-10-CM | POA: Diagnosis not present

## 2021-09-16 DIAGNOSIS — I480 Paroxysmal atrial fibrillation: Secondary | ICD-10-CM | POA: Diagnosis not present

## 2021-09-16 DIAGNOSIS — D6869 Other thrombophilia: Secondary | ICD-10-CM | POA: Diagnosis not present

## 2021-09-16 DIAGNOSIS — Z515 Encounter for palliative care: Secondary | ICD-10-CM | POA: Diagnosis not present

## 2021-09-16 DIAGNOSIS — D692 Other nonthrombocytopenic purpura: Secondary | ICD-10-CM | POA: Diagnosis not present

## 2021-09-16 DIAGNOSIS — Z66 Do not resuscitate: Secondary | ICD-10-CM | POA: Diagnosis not present

## 2021-09-16 LAB — KAPPA/LAMBDA LIGHT CHAINS
Kappa free light chain: 73.9 mg/L — ABNORMAL HIGH (ref 3.3–19.4)
Kappa, lambda light chain ratio: 8.8 — ABNORMAL HIGH (ref 0.26–1.65)
Lambda free light chains: 8.4 mg/L (ref 5.7–26.3)

## 2021-09-17 LAB — IMMUNOGLOBULINS A/E/G/M, SERUM
IgA: 76 mg/dL (ref 61–437)
IgE (Immunoglobulin E), Serum: <2 IU/mL — ABNORMAL LOW (ref 6–495)
IgG (Immunoglobin G), Serum: 349 mg/dL — ABNORMAL LOW (ref 603–1613)
IgM (Immunoglobulin M), Srm: 28 mg/dL (ref 15–143)

## 2021-09-21 DIAGNOSIS — H353231 Exudative age-related macular degeneration, bilateral, with active choroidal neovascularization: Secondary | ICD-10-CM | POA: Diagnosis not present

## 2021-09-22 ENCOUNTER — Other Ambulatory Visit: Payer: Self-pay | Admitting: Oncology

## 2021-09-22 DIAGNOSIS — C9000 Multiple myeloma not having achieved remission: Secondary | ICD-10-CM

## 2021-09-24 ENCOUNTER — Other Ambulatory Visit: Payer: Self-pay

## 2021-09-24 ENCOUNTER — Other Ambulatory Visit: Payer: PPO

## 2021-09-24 VITALS — BP 130/78 | HR 49 | Temp 97.5°F | Resp 18 | Wt 160.0 lb

## 2021-09-24 DIAGNOSIS — Z515 Encounter for palliative care: Secondary | ICD-10-CM

## 2021-09-24 NOTE — Progress Notes (Signed)
PATIENT NAME: Jack [name] DOB: 26-Apr-1933 MRN: [id number]  PRIMARY CARE PROVIDER: [name], MD  RESPONSIBLE PARTY:  Acct ID - Guarantor Home Phone Work Phone Relationship Acct Type  [id number] KRISTOFER, [phone]  Self P/F     [address] [place], [place] [address]    PLAN OF CARE and INTERVENTIONS:               1.  GOALS OF CARE/ ADVANCE CARE PLANNING:  Remain home under the care of his wife.                2.  PATIENT/CAREGIVER EDUCATION:  Palliative Care vs Hospice               4. PERSONAL EMERGENCY PLAN:  Activate 911 for emergencies.               5.  DISEASE STATUS:  Joint visit completed with patient and wife.   Pain:  Patient reports occasional issues with lower back pain.  Continues on OxyContin bid and taking oxy ir as needed.  Wife notes patient has not required PRN dose is some time now.  Thigh pain has resolved with the decrease of Lipitor.  Appetite:  Patient endorse a good appetite.  Eating 3 meals a day with snacking in between.  Notes weight is up to 160 lbs.  Taking [employer] drink supplement daily.  No issues with nausea or vomiting.  Mobility:  Patient endorse being very active.  He is walking laps in his neighborhood and plans to increase walking to 2 x daily.  He no longer requires a walker and denies any falls.  Patient is doing arm exercises with light weights.  He feels he has returned back to his baseline with energy level.  Insomnia:  No issues with insomnia but patient feels he maybe trending back to a previous routine of getting up multiple times during the night.  He is staying active during the day and not napping so he can rest better at night.  Palliative Care vs Hospice:  Wife questioning how Palliative Care and Hospice works and if patient will discharge from [facility] at some point.  Explained services and advised that patient could remain under Palliative Care and visits could be decreased if patient is doing well.  Wife and  patient feel he is doing very well overall.  They are now following up with speciality providers such as retina specialist and dermatology as patient has improved to the point he is able to make appointments without difficulty.  They would like a follow up with [facility] NP in March.  Visit scheduled for 12/01/21 @ 2 pm.   Update provided to Christin Gusler, NP.   HISTORY OF PRESENT ILLNESS:   Jack [name] is a 88 y.o. year old male  with  multiple medical problems including hypertension, CKD stage IIIb, A. fib on Eliquis, and anemia.  CODE STATUS: DNR ADVANCED DIRECTIVES: No MOST FORM: No PPS: 50%   PHYSICAL EXAM:   VITALS: Today's Vitals   09/24/21 1102  BP: 130/78  Pulse: (!) 49  Resp: 18  Temp: (!) 97.5 F (36.4 C)  SpO2: 96%  Weight: 160 lb (72.6 kg)  PainSc: 0-No pain    LUNGS: clear to auscultation  CARDIAC: Cor Brady}  EXTREMITIES: trace edema to bilateral ankles SKIN: Skin color, texture, turgor normal. No rashes or lesions or normal  NEURO: negative for coordination problems, dizziness, headaches, memory problems, paresthesia, seizures, speech problems,  tremors, vertigo, and weakness       Lorenza Burton, RN

## 2021-09-25 ENCOUNTER — Other Ambulatory Visit: Payer: Self-pay | Admitting: Emergency Medicine

## 2021-09-25 ENCOUNTER — Encounter: Payer: Self-pay | Admitting: Oncology

## 2021-09-25 ENCOUNTER — Encounter: Payer: Self-pay | Admitting: Urology

## 2021-09-25 DIAGNOSIS — C9000 Multiple myeloma not having achieved remission: Secondary | ICD-10-CM

## 2021-09-25 MED ORDER — OXYCODONE HCL ER 20 MG PO T12A: 20.0000 mg | EXTENDED_RELEASE_TABLET | Freq: Two times a day (BID) | ORAL | 0 refills | Status: DC

## 2021-09-28 NOTE — Telephone Encounter (Signed)
I spoke with patient and wife by phone.  Both feel that pain is significantly improved on current regimen.  However, patient and wife would like to try him off the OxyContin and see if pain can be controlled with short acting oxycodone.  Discussed range of oxycodone IR 5 to 10 mg every 4 hours as needed for breakthrough pain.  They will keep [name] posted and we can consider restarting a long-acting medication if needed.

## 2021-09-29 ENCOUNTER — Other Ambulatory Visit: Payer: Self-pay | Admitting: Emergency Medicine

## 2021-09-29 ENCOUNTER — Other Ambulatory Visit: Payer: Self-pay | Admitting: Oncology

## 2021-09-29 ENCOUNTER — Encounter: Payer: Self-pay | Admitting: Oncology

## 2021-09-29 DIAGNOSIS — C9000 Multiple myeloma not having achieved remission: Secondary | ICD-10-CM

## 2021-09-29 MED ORDER — OXYCODONE HCL 5 MG PO TABS: 5.0000 mg | ORAL_TABLET | ORAL | 0 refills | Status: DC | PRN

## 2021-10-01 ENCOUNTER — Encounter: Payer: Self-pay | Admitting: Oncology

## 2021-10-02 ENCOUNTER — Other Ambulatory Visit: Payer: Self-pay | Admitting: Oncology

## 2021-10-02 DIAGNOSIS — C9000 Multiple myeloma not having achieved remission: Secondary | ICD-10-CM

## 2021-10-05 ENCOUNTER — Encounter: Payer: Self-pay | Admitting: Oncology

## 2021-10-05 ENCOUNTER — Telehealth: Payer: Self-pay | Admitting: Hospice and Palliative Medicine

## 2021-10-05 MED ORDER — MORPHINE SULFATE ER 15 MG PO TBCR
15.0000 mg | EXTENDED_RELEASE_TABLET | Freq: Two times a day (BID) | ORAL | 0 refills | Status: DC
Start: 2021-10-05 — End: 2021-10-14

## 2021-10-05 NOTE — Telephone Encounter (Signed)
Spoke with patient and wife by phone.  Patient reports worse generalized pain since discontinuing OxyContin.  OxyContin was previously prescribed at 20 mg every 12 hours but was no longer covered by insurance as of January 1.  Initially, patient had requested to stay off OxyContin and just wanted to use oxycodone IR as needed for breakthrough pain.  He reports that he has been using the oxycodone IR generally about 3 times a day but is having worse overall pain control.  They would like to restart a long-acting medication.  We reviewed their insurance formulary and MS Contin is covered.  We will start MS Contin 15 mg every 12 hours.  Patient may continue oxycodone IR as needed for breakthrough pain.  PDMP reviewed

## 2021-10-06 ENCOUNTER — Other Ambulatory Visit: Payer: Self-pay | Admitting: *Deleted

## 2021-10-06 DIAGNOSIS — C9000 Multiple myeloma not having achieved remission: Secondary | ICD-10-CM

## 2021-10-06 MED ORDER — LENALIDOMIDE 20 MG PO CAPS: 20.0000 mg | ORAL_CAPSULE | Freq: Every day | ORAL | 0 refills | Status: DC

## 2021-10-09 DIAGNOSIS — H903 Sensorineural hearing loss, bilateral: Secondary | ICD-10-CM | POA: Diagnosis not present

## 2021-10-10 DIAGNOSIS — S22000A Wedge compression fracture of unspecified thoracic vertebra, initial encounter for closed fracture: Secondary | ICD-10-CM | POA: Diagnosis not present

## 2021-10-10 DIAGNOSIS — M8440XA Pathological fracture, unspecified site, initial encounter for fracture: Secondary | ICD-10-CM | POA: Diagnosis not present

## 2021-10-10 DIAGNOSIS — C9000 Multiple myeloma not having achieved remission: Secondary | ICD-10-CM | POA: Diagnosis not present

## 2021-10-10 DIAGNOSIS — J9601 Acute respiratory failure with hypoxia: Secondary | ICD-10-CM | POA: Diagnosis not present

## 2021-10-13 ENCOUNTER — Inpatient Hospital Stay: Payer: PPO

## 2021-10-13 ENCOUNTER — Telehealth: Payer: Self-pay | Admitting: *Deleted

## 2021-10-13 ENCOUNTER — Inpatient Hospital Stay: Payer: PPO | Admitting: Pharmacist

## 2021-10-13 ENCOUNTER — Inpatient Hospital Stay (HOSPITAL_BASED_OUTPATIENT_CLINIC_OR_DEPARTMENT_OTHER): Payer: PPO | Admitting: Hospice and Palliative Medicine

## 2021-10-13 ENCOUNTER — Ambulatory Visit: Payer: PPO | Admitting: Oncology

## 2021-10-13 ENCOUNTER — Other Ambulatory Visit: Payer: Self-pay

## 2021-10-13 VITALS — BP 140/76 | HR 48 | Temp 98.4°F | Resp 16 | Wt 158.0 lb

## 2021-10-13 DIAGNOSIS — C9000 Multiple myeloma not having achieved remission: Secondary | ICD-10-CM

## 2021-10-13 DIAGNOSIS — Z515 Encounter for palliative care: Secondary | ICD-10-CM

## 2021-10-13 LAB — CBC WITH DIFFERENTIAL/PLATELET
Abs Immature Granulocytes: 0 10*3/uL (ref 0.00–0.07)
Basophils Absolute: 0 10*3/uL (ref 0.0–0.1)
Basophils Relative: 1 %
Eosinophils Absolute: 0.1 10*3/uL (ref 0.0–0.5)
Eosinophils Relative: 4 %
HCT: 40 % (ref 39.0–52.0)
Hemoglobin: 12.7 g/dL — ABNORMAL LOW (ref 13.0–17.0)
Immature Granulocytes: 0 %
Lymphocytes Relative: 49 %
Lymphs Abs: 1.3 10*3/uL (ref 0.7–4.0)
MCH: 30.8 pg (ref 26.0–34.0)
MCHC: 31.8 g/dL (ref 30.0–36.0)
MCV: 96.9 fL (ref 80.0–100.0)
Monocytes Absolute: 0.4 10*3/uL (ref 0.1–1.0)
Monocytes Relative: 13 %
Neutro Abs: 0.9 10*3/uL — ABNORMAL LOW (ref 1.7–7.7)
Neutrophils Relative %: 33 %
Platelets: 95 10*3/uL — ABNORMAL LOW (ref 150–400)
RBC: 4.13 MIL/uL — ABNORMAL LOW (ref 4.22–5.81)
RDW: 16 % — ABNORMAL HIGH (ref 11.5–15.5)
WBC: 2.7 10*3/uL — ABNORMAL LOW (ref 4.0–10.5)
nRBC: 0 % (ref 0.0–0.2)

## 2021-10-13 LAB — COMPREHENSIVE METABOLIC PANEL
ALT: 13 U/L (ref 0–44)
AST: 21 U/L (ref 15–41)
Albumin: 3.7 g/dL (ref 3.5–5.0)
Alkaline Phosphatase: 81 U/L (ref 38–126)
Anion gap: 7 (ref 5–15)
BUN: 29 mg/dL — ABNORMAL HIGH (ref 8–23)
CO2: 29 mmol/L (ref 22–32)
Calcium: 9.1 mg/dL (ref 8.9–10.3)
Chloride: 101 mmol/L (ref 98–111)
Creatinine, Ser: 1.26 mg/dL — ABNORMAL HIGH (ref 0.61–1.24)
GFR, Estimated: 56 mL/min — ABNORMAL LOW (ref >60)
Glucose, Bld: 94 mg/dL (ref 70–99)
Potassium: 4.6 mmol/L (ref 3.5–5.1)
Sodium: 137 mmol/L (ref 135–145)
Total Bilirubin: 0.5 mg/dL (ref 0.3–1.2)
Total Protein: 6.2 g/dL — ABNORMAL LOW (ref 6.5–8.1)

## 2021-10-13 MED ORDER — DENOSUMAB 120 MG/1.7ML ~~LOC~~ SOLN
120.0000 mg | Freq: Once | SUBCUTANEOUS | Status: AC
  Administered 2021-10-13: 120 mg via SUBCUTANEOUS
  Filled 2021-10-13: qty 1.7

## 2021-10-13 NOTE — Progress Notes (Signed)
[place]  Telephone:([phone] Fax:([phone]  Patient Care Team: [name], MD as PCP - General (Family Medicine) End, [name], MD as PCP - Cardiology (Cardiology) [name], MD (Endocrinology)   Name of the patient: Jack [name]  [id number]  02/11/1933   Date of visit: 10/13/21  HPI: Patient is a 88 y.o. male with newly diagnosed multiple myeloma. Currently treated with Revlimid (lenalidomide) and dexamethasone. Patient was initiated on an all oral regimen because he was unable to physically make it into clinic at the time treatment due to his disease/performance status. His status improved he was able to come back to inperson appts on 07/14/21.  Reason for Consult: Oral chemotherapy follow-up for lenalidomide therapy.   PAST MEDICAL HISTORY: Past Medical History:  Diagnosis Date   Elevated prostate specific antigen (PSA)    has been 7 for a year    GERD (gastroesophageal reflux disease)    History of colon polyps 2008   [facility],    History of kidney stones    Hyperlipidemia    Hypertension    Prostate hypertrophy    diagnosed at age 50 due to hematospermia   Renal disorder     HEMATOLOGY/ONCOLOGY HISTORY:  Oncology History  Kappa light chain myeloma ([place])  03/12/2021 Initial Diagnosis   Kappa light chain myeloma ([place])   03/27/2021 -  Chemotherapy    Patient is on Treatment Plan: MYELOMA NON-TRANSPLANT CANDIDATES VRD SQ Q21D        04/16/2021 Cancer Staging   Staging form: Plasma Cell Myeloma and Plasma Cell Disorders, AJCC 8th Edition - Clinical stage from 04/16/2021: Albumin (g/dL): 3.8, ISS: Stage II, High-risk cytogenetics: Absent, LDH: Unknown - Signed by Lloyd Huger, MD on 04/16/2021 Albumin range (g/dL): Greater than or equal to 3.5 Cytogenetics: t(11;14) translocation Serum calcium level: Normal Serum creatinine level: Normal Bone disease on imaging: Present       ALLERGIES:  is allergic to quinolones, amlodipine, azithromycin, lisinopril, and morphine and related.  MEDICATIONS:  Current Outpatient Medications  Medication Sig Dispense Refill   atorvastatin (LIPITOR) 40 MG tablet Take 1 tablet (40 mg total) by mouth daily at 6 PM. (Patient taking differently: Take 40 mg by mouth. 2 times a week) 90 tablet 3   calcitonin, salmon, (MIACALCIN/FORTICAL) 200 UNIT/ACT nasal spray Place 1 spray into alternate nostrils daily. (Patient not taking: Reported on 08/11/2021) 3.7 mL 12   Calcium Carbonate-Vit D-Min (CALCIUM 1200 PO) Take by mouth.     carvedilol (COREG) 25 MG tablet Take 1 tablet (25 mg total) by mouth 2 (two) times daily with a meal. (Patient taking differently: Take 25 mg by mouth daily.) 60 tablet 1   dexamethasone (DECADRON) 4 MG tablet TAKE 5 TABLETS BY MOUTH ONCE A WEEK. 20 tablet 4   docusate sodium (COLACE) 100 MG capsule Take 1 capsule (100 mg total) by mouth 2 (two) times daily. (Patient not taking: Reported on 09/24/2021) 60 capsule 0   ELIQUIS 5 MG TABS tablet TAKE 1 TABLET (5 MG TOTAL) BY MOUTH 2 (TWO) TIMES DAILY. 180 tablet 0   feeding supplement (ENSURE ENLIVE / ENSURE PLUS) LIQD Take 237 mLs by mouth 3 (three) times daily between meals. 237 mL 12   fluticasone (FLONASE) 50 MCG/ACT nasal spray Place into both nostrils daily.     gabapentin (NEURONTIN) 300 MG capsule TAKE 2 CAPSULES (600 MG TOTAL) BY MOUTH 3 (THREE) TIMES DAILY. 180 capsule 2   lenalidomide (REVLIMID) 20 MG capsule  Take 1 capsule (20 mg total) by mouth daily. Take for 21 days, then hold for 7 days. Repeat every 28 days. 21 capsule 0   lidocaine (XYLOCAINE) 5 % ointment Apply topically 3 (three) times daily as needed for mild pain or moderate pain. (Patient not taking: Reported on 09/24/2021) 35.44 g 0   morphine (MS CONTIN) 15 MG 12 hr tablet Take 1 tablet (15 mg total) by mouth every 12 (twelve) hours. 30 tablet 0   Multiple Vitamin (MULTIVITAMIN WITH MINERALS) TABS  tablet Take 1 tablet by mouth daily.     Multiple Vitamins-Minerals (PRESERVISION AREDS PO) Take by mouth.     naloxone (NARCAN) nasal spray 4 mg/0.1 mL SPRAY 1 SPRAY INTO ONE NOSTRIL AS DIRECTED FOR OPIOID OVERDOSE (TURN PERSON ON SIDE AFTER DOSE. IF NO RESPONSE IN 2-3 MINUTES OR PERSON RESPONDS BUT RELAPSES, REPEAT USING A NEW SPRAY DEVICE AND SPRAY INTO THE OTHER NOSTRIL. CALL 911 AFTER USE.) * EMERGENCY USE ONLY * (Patient not taking: Reported on 08/11/2021) 1 each 0   ondansetron (ZOFRAN) 8 MG tablet Take 1 tablet (8 mg total) by mouth every 8 (eight) hours as needed for nausea or vomiting. 60 tablet 2   oxyCODONE (OXY IR/ROXICODONE) 5 MG immediate release tablet Take 1-2 tablets (5-10 mg total) by mouth every 4 (four) hours as needed for severe pain. 90 tablet 0   polyethylene glycol (MIRALAX / GLYCOLAX) 17 g packet Take 17 g by mouth 2 (two) times daily.  0   potassium citrate (UROCIT-K) 10 MEQ (1080 MG) SR tablet Take 1 tablet (10 mEq total) by mouth 3 (three) times daily with meals. 90 tablet 6   senna-docusate (SENOKOT-S) 8.6-50 MG tablet Take 1 tablet by mouth 2 (two) times daily. 30 tablet 0   tamsulosin (FLOMAX) 0.4 MG CAPS capsule TAKE 1 CAPSULE BY MOUTH EVERY DAY 90 capsule 1   valACYclovir (VALTREX) 500 MG tablet Take 500 mg by mouth 2 (two) times daily. (Patient not taking: Reported on 09/24/2021)     No current facility-administered medications for this visit.    VITAL SIGNS: There were no vitals taken for this visit. There were no vitals filed for this visit.  Estimated body mass index is 23.33 kg/m as calculated from the following:   Height as of 09/15/21: _0  (1.753 m).   Weight as of an earlier encounter on 10/13/21: 71.7 kg (158 lb).  LABS: CBC:    Component Value Date/Time   WBC 2.7 (L) 10/13/2021 1333   HGB 12.7 (L) 10/13/2021 1333   HGB 11.0 (L) 02/24/2021 1014   HCT 40.0 10/13/2021 1333   HCT 35.3 (L) 02/24/2021 1014   PLT 95 (L) 10/13/2021 1333   PLT 249  02/24/2021 1014   MCV 96.9 10/13/2021 1333   MCV 96 02/24/2021 1014   MCV 90 01/24/2013 0819   NEUTROABS 0.9 (L) 10/13/2021 1333   NEUTROABS 7.4 (H) 01/24/2013 0819   LYMPHSABS 1.3 10/13/2021 1333   LYMPHSABS 1.2 01/24/2013 0819   MONOABS 0.4 10/13/2021 1333   MONOABS 0.3 01/24/2013 0819   EOSABS 0.1 10/13/2021 1333   EOSABS 0.0 01/24/2013 0819   BASOSABS 0.0 10/13/2021 1333   BASOSABS 0.0 01/24/2013 0819   Comprehensive Metabolic Panel:    Component Value Date/Time   NA 137 10/13/2021 1333   NA 136 02/24/2021 1014   NA 138 01/24/2013 0819   K 4.6 10/13/2021 1333   K 3.8 01/24/2013 0819   CL 101 10/13/2021 1333   CL 105  01/24/2013 0819   CO2 29 10/13/2021 1333   CO2 25 01/24/2013 0819   BUN 29 (H) 10/13/2021 1333   BUN 23 02/24/2021 1014   BUN 20 (H) 01/24/2013 0819   CREATININE 1.26 (H) 10/13/2021 1333   CREATININE 1.52 (H) 01/24/2013 0819   GLUCOSE 94 10/13/2021 1333   GLUCOSE 171 (H) 01/24/2013 0819   CALCIUM 9.1 10/13/2021 1333   CALCIUM 10.4 (H) 02/19/2021 1229   AST 21 10/13/2021 1333   AST 18 01/24/2013 0819   ALT 13 10/13/2021 1333   ALT 15 01/24/2013 0819   ALKPHOS 81 10/13/2021 1333   ALKPHOS 74 01/24/2013 0819   BILITOT 0.5 10/13/2021 1333   BILITOT 0.4 02/24/2021 1014   BILITOT 0.5 01/24/2013 0819   PROT 6.2 (L) 10/13/2021 1333   PROT 6.2 02/24/2021 1014   PROT 6.3 (L) 01/24/2013 0819   ALBUMIN 3.7 10/13/2021 1333   ALBUMIN 4.5 02/24/2021 1014   ALBUMIN 3.6 01/24/2013 0819     Present during today's visit: Patient and his wife Jack [name]  Assessment and Plan: CBC/BMP reviewed with patient and wife, due to neutropenia, renal function, and decrease in platelet count, plan to give Mr. Danielson and extra week off lenalidomide for count recovery.  Pending count lab improvement, will restart with lenalidomide patient has on hand lenalidomide 62m. With new cycle will dose decrease to 138m   Oral Chemotherapy Side Effect/Intolerance:  No reported rash,  N/V, diarrhea, or constipation.  Reported increased fatigue which might be from the recent change in his pain medications or from his treatment. Continue to monitor.   Oral Chemotherapy Adherence: no reported missed doses No patient barriers to medication adherence identified.   New medications: none reported  Medication Access Issues: no issues, fills Revlimid at [facility]  Patient expressed understanding and was in agreement with this plan. He also understands that He can call clinic at any time with any questions, concerns, or complaints.   Follow-up plan: RTC in 1 week for labs only and 5 weeks for lab/MD/pharm/inj  Thank you for allowing me to participate in the care of this very pleasant patient.   Time Total: 20 mins  Visit consisted of counseling and education on dealing with issues of symptom management in the setting of serious and potentially life-threatening illness.Greater than 50%  of this time was spent counseling and coordinating care related to the above assessment and plan.  Signed by: AlDarl PikesPharmD, BCPS, BCSalley SlaughterCPP Hematology/Oncology Clinical Pharmacist Practitioner [place]/DB/AP [phone]/31/2023 2:57 PM

## 2021-10-13 NOTE — Telephone Encounter (Signed)
Per Clearnce Sorrel, pharmacist and Tiffany, RN, [name] came to clinic today with concerns regarding a claim denial (dos: 09/09/21) for a hospital bed. Reason for denial was no PA was initiated. I have diligently reached out to [employer] and pt's DME company-[employer] (now [place]). At this time, the PA must come through the [place]. (Dr. Gary Fleet team is not able to initiate the prior auth.) I have provided the patient's wife with a detailed update via phone call and mychart chart msg.  I have provided her with all the contact information to [place] to further discuss this billing and information regarding my collaboration efforts with the insurance. (See mychart msg for further details).  I will send a copy of this letter to be scanned into the patient's chart.

## 2021-10-13 NOTE — Progress Notes (Signed)
[place] at [facility] Telephone:([phone] [phone]) [phone]   Name: Jack [name] Date: 10/13/2021 MRN: [id number]  DOB: February 13, 1933  Patient Care Team: [name], MD as PCP - General (Family Medicine) End, [name], MD as PCP - Cardiology (Cardiology) [name], MD (Endocrinology)    REASON FOR CONSULTATION: Jack [name] is a 88 y.o. male with multiple medical problems including hypertension, CKD stage IIIb, A. fib on Eliquis, and anemia.  Patient was hospitalized 01/02/2021 to 01/07/2021 with T12 compression fracture with recommendation for conservative management including use of a TLSO brace.  Patient was hospitalized again 01/29/2021 to 02/07/2021 with recurrent back pain and MR showing T12 and new L1 compression fracture.  Patient underwent T12/L1 kyphoplasty on 02/02/2021.  There was also suspicion of a left fifth rib fracture, which occurred while transitioning from the stretcher for his kyphoplasty.  Patient had mild hypercalcemia with SPEP and UPEP concerning for possible myeloma.  Patient was referred to [facility] for work-up.  He was readmitted 03/11/2021 - 03/17/2021 with worsening back pain.  CT revealed new compression fractures of L2 and L3.  Patient underwent kyphoplasty on 6/30.  He was hospitalized again 03/26/2021-04/07/2021 with severe back/flank pain.  MRI revealed T10 pathologic compression fracture.  He was started on treatment for multiple myeloma with dexamethasone/Velcade and XRT to spine.  Palliative care was consulted to help address goals and manage ongoing symptoms.   SOCIAL HISTORY:     reports that he quit smoking about 56 years ago. His smoking use included cigarettes. He has never used smokeless tobacco. He reports current alcohol use of about 1.0 standard drink per week. He reports that he does not use drugs.  Patient lives at [place] in [place] with his wife of 19 years.   Patient has a son and daughter who are also involved in his care.  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  ADVANCE DIRECTIVES:  On file  CODE STATUS:   PAST MEDICAL HISTORY: Past Medical History:  Diagnosis Date   Elevated prostate specific antigen (PSA)    has been 7 for a year    GERD (gastroesophageal reflux disease)    History of colon polyps 2008   [facility],    History of kidney stones    Hyperlipidemia    Hypertension    Prostate hypertrophy    diagnosed at age 66 due to hematospermia   Renal disorder     PAST SURGICAL HISTORY:  Past Surgical History:  Procedure Laterality Date   CATARACT EXTRACTION W/PHACO Left 01/10/2018   Procedure: CATARACT EXTRACTION PHACO AND INTRAOCULAR LENS PLACEMENT ([place]);  Surgeon: Birder Robson, MD;  Location: [facility] ORS;  Service: Ophthalmology;  Laterality: Left;  [name] 00:24.8 AP% 14.9 CDE 3.68 Fluid pack lot # [id number]   CATARACT EXTRACTION W/PHACO Right 01/25/2018   Procedure: CATARACT EXTRACTION PHACO AND INTRAOCULAR LENS PLACEMENT (IOC);  Surgeon: Birder Robson, MD;  Location: [facility] ORS;  Service: Ophthalmology;  Laterality: Right;  [name] 00:42 AP% 10.8 CDE 4.59 Fluid pack lot # [id number]   COLON SURGERY     CYSTOSCOPY W/ URETERAL STENT PLACEMENT Right 10/16/2017   Procedure: right  URETERAL STENT PLACEMENT,cystoscopy bilateral stent removal,rretrograde;  Surgeon: Abbie Sons, MD;  Location: [facility] ORS;  Service: Urology;  Laterality: Right;   CYSTOSCOPY/URETEROSCOPY/HOLMIUM LASER/STENT PLACEMENT Right 12/16/2020   Procedure: CYSTOSCOPY/URETEROSCOPY/HOLMIUM LASER/STENT PLACEMENT;  Surgeon: Abbie Sons, MD;  Location: [facility] ORS;  Service: Urology;  Laterality: Right;   EXTRACORPOREAL SHOCK WAVE LITHOTRIPSY Right 12/11/2020   Procedure: EXTRACORPOREAL SHOCK WAVE LITHOTRIPSY (ESWL);  Surgeon: Abbie Sons, MD;  Location: [facility] ORS;  Service: Urology;  Laterality: Right;   IR KYPHO EA ADDL  LEVEL THORACIC OR LUMBAR  02/02/2021   IR KYPHO LUMBAR INC FX REDUCE BONE BX UNI/BIL CANNULATION INC/IMAGING  02/02/2021   KIDNEY STONE SURGERY     KYPHOPLASTY N/A 03/12/2021   Procedure: Hewitt Shorts, L3;  Surgeon: Hessie Knows, MD;  Location: [facility] ORS;  Service: Orthopedics;  Laterality: N/A;   RESECTION SOFT TISSUE TUMOR LEG / ANKLE RADICAL  jan 2009   [facility],  right thigh/knee , nonmalignant   SMALL INTESTINE SURGERY  1946   implaed on picket fence, punctured stomach   TONSILLECTOMY      HEMATOLOGY/ONCOLOGY HISTORY:  Oncology History  Kappa light chain myeloma ([place])  03/12/2021 Initial Diagnosis   Kappa light chain myeloma ([place])   03/27/2021 -  Chemotherapy    Patient is on Treatment Plan: MYELOMA NON-TRANSPLANT CANDIDATES VRD SQ Q21D        04/16/2021 Cancer Staging   Staging form: Plasma Cell Myeloma and Plasma Cell Disorders, AJCC 8th Edition - Clinical stage from 04/16/2021: Albumin (g/dL): 3.8, ISS: Stage II, High-risk cytogenetics: Absent, LDH: Unknown - Signed by Lloyd Huger, MD on 04/16/2021 Albumin range (g/dL): Greater than or equal to 3.5 Cytogenetics: t(11;14) translocation Serum calcium level: Normal Serum creatinine level: Normal Bone disease on imaging: Present      ALLERGIES:  is allergic to quinolones, amlodipine, azithromycin, lisinopril, and morphine and related.  MEDICATIONS:  Current Outpatient Medications  Medication Sig Dispense Refill   atorvastatin (LIPITOR) 40 MG tablet Take 1 tablet (40 mg total) by mouth daily at 6 PM. (Patient taking differently: Take 40 mg by mouth. 2 times a week) 90 tablet 3   carvedilol (COREG) 25 MG tablet Take 1 tablet (25 mg total) by mouth 2 (two) times daily with a meal. (Patient taking differently: Take 25 mg by mouth daily.) 60 tablet 1   dexamethasone (DECADRON) 4 MG tablet TAKE 5 TABLETS BY MOUTH ONCE A WEEK. 20 tablet 4   ELIQUIS 5 MG TABS tablet TAKE 1 TABLET (5 MG TOTAL) BY MOUTH 2 (TWO) TIMES DAILY. 180  tablet 0   fluticasone (FLONASE) 50 MCG/ACT nasal spray Place into both nostrils daily.     gabapentin (NEURONTIN) 300 MG capsule TAKE 2 CAPSULES (600 MG TOTAL) BY MOUTH 3 (THREE) TIMES DAILY. 180 capsule 2   morphine (MS CONTIN) 15 MG 12 hr tablet Take 1 tablet (15 mg total) by mouth every 12 (twelve) hours. 30 tablet 0   Multiple Vitamin (MULTIVITAMIN WITH MINERALS) TABS tablet Take 1 tablet by mouth daily.     Multiple Vitamins-Minerals (PRESERVISION AREDS PO) Take by mouth.     oxyCODONE (OXY IR/ROXICODONE) 5 MG immediate release tablet Take 1-2 tablets (5-10 mg total) by mouth every 4 (four) hours as needed for severe pain. 90 tablet 0   polyethylene glycol (MIRALAX / GLYCOLAX) 17 g packet Take 17 g by mouth 2 (two) times daily.  0   potassium citrate (UROCIT-K) 10 MEQ (1080 MG) SR tablet Take 1 tablet (10 mEq total) by mouth 3 (three) times daily with meals. 90 tablet 6   tamsulosin (FLOMAX) 0.4 MG CAPS capsule TAKE 1 CAPSULE BY MOUTH EVERY DAY 90 capsule 1   calcitonin, salmon, (MIACALCIN/FORTICAL) 200 UNIT/ACT nasal spray Place 1 spray into alternate nostrils daily. (  Patient not taking: Reported on 08/11/2021) 3.7 mL 12   Calcium Carbonate-Vit D-Min (CALCIUM 1200 PO) Take by mouth.     docusate sodium (COLACE) 100 MG capsule Take 1 capsule (100 mg total) by mouth 2 (two) times daily. (Patient not taking: Reported on 09/24/2021) 60 capsule 0   feeding supplement (ENSURE ENLIVE / ENSURE PLUS) LIQD Take 237 mLs by mouth 3 (three) times daily between meals. 237 mL 12   lenalidomide (REVLIMID) 20 MG capsule Take 1 capsule (20 mg total) by mouth daily. Take for 21 days, then hold for 7 days. Repeat every 28 days. 21 capsule 0   lidocaine (XYLOCAINE) 5 % ointment Apply topically 3 (three) times daily as needed for mild pain or moderate pain. (Patient not taking: Reported on 09/24/2021) 35.44 g 0   naloxone (NARCAN) nasal spray 4 mg/0.1 mL SPRAY 1 SPRAY INTO ONE NOSTRIL AS DIRECTED FOR OPIOID OVERDOSE  (TURN PERSON ON SIDE AFTER DOSE. IF NO RESPONSE IN 2-3 MINUTES OR PERSON RESPONDS BUT RELAPSES, REPEAT USING A NEW SPRAY DEVICE AND SPRAY INTO THE OTHER NOSTRIL. CALL 911 AFTER USE.) * EMERGENCY USE ONLY * (Patient not taking: Reported on 08/11/2021) 1 each 0   ondansetron (ZOFRAN) 8 MG tablet Take 1 tablet (8 mg total) by mouth every 8 (eight) hours as needed for nausea or vomiting. 60 tablet 2   senna-docusate (SENOKOT-S) 8.6-50 MG tablet Take 1 tablet by mouth 2 (two) times daily. 30 tablet 0   valACYclovir (VALTREX) 500 MG tablet Take 500 mg by mouth 2 (two) times daily. (Patient not taking: Reported on 09/24/2021)     No current facility-administered medications for this visit.    VITAL SIGNS: BP 140/76    Pulse (!) 48    Temp 98.4 F (36.9 C) (Oral)    Resp 16    Wt 158 lb (71.7 kg)    SpO2 99%    BMI 23.33 kg/m  Filed Weights   10/13/21 1414  Weight: 158 lb (71.7 kg)    Estimated body mass index is 23.33 kg/m as calculated from the following:   Height as of 09/15/21: _0  (1.753 m).   Weight as of this encounter: 158 lb (71.7 kg).  LABS: CBC:    Component Value Date/Time   WBC 2.7 (L) 10/13/2021 1333   HGB 12.7 (L) 10/13/2021 1333   HGB 11.0 (L) 02/24/2021 1014   HCT 40.0 10/13/2021 1333   HCT 35.3 (L) 02/24/2021 1014   PLT 95 (L) 10/13/2021 1333   PLT 249 02/24/2021 1014   MCV 96.9 10/13/2021 1333   MCV 96 02/24/2021 1014   MCV 90 01/24/2013 0819   NEUTROABS 0.9 (L) 10/13/2021 1333   NEUTROABS 7.4 (H) 01/24/2013 0819   LYMPHSABS 1.3 10/13/2021 1333   LYMPHSABS 1.2 01/24/2013 0819   MONOABS 0.4 10/13/2021 1333   MONOABS 0.3 01/24/2013 0819   EOSABS 0.1 10/13/2021 1333   EOSABS 0.0 01/24/2013 0819   BASOSABS 0.0 10/13/2021 1333   BASOSABS 0.0 01/24/2013 0819   Comprehensive Metabolic Panel:    Component Value Date/Time   NA 137 10/13/2021 1333   NA 136 02/24/2021 1014   NA 138 01/24/2013 0819   K 4.6 10/13/2021 1333   K 3.8 01/24/2013 0819   CL 101 10/13/2021  1333   CL 105 01/24/2013 0819   CO2 29 10/13/2021 1333   CO2 25 01/24/2013 0819   BUN 29 (H) 10/13/2021 1333   BUN 23 02/24/2021 1014   BUN 20 (H)  01/24/2013 0819   CREATININE 1.26 (H) 10/13/2021 1333   CREATININE 1.52 (H) 01/24/2013 0819   GLUCOSE 94 10/13/2021 1333   GLUCOSE 171 (H) 01/24/2013 0819   CALCIUM 9.1 10/13/2021 1333   CALCIUM 10.4 (H) 02/19/2021 1229   AST 21 10/13/2021 1333   AST 18 01/24/2013 0819   ALT 13 10/13/2021 1333   ALT 15 01/24/2013 0819   ALKPHOS 81 10/13/2021 1333   ALKPHOS 74 01/24/2013 0819   BILITOT 0.5 10/13/2021 1333   BILITOT 0.4 02/24/2021 1014   BILITOT 0.5 01/24/2013 0819   PROT 6.2 (L) 10/13/2021 1333   PROT 6.2 02/24/2021 1014   PROT 6.3 (L) 01/24/2013 0819   ALBUMIN 3.7 10/13/2021 1333   ALBUMIN 4.5 02/24/2021 1014   ALBUMIN 3.6 01/24/2013 0819    RADIOGRAPHIC STUDIES: No results found.  PERFORMANCE STATUS (ECOG) : 1 - Symptomatic but completely ambulatory  Review of Systems Unless otherwise noted, a complete review of systems is negative.  Physical Exam General: NAD Pulmonary: Unlabored Extremities: no edema, no joint deformities Skin: no rashes Neurological: Weakness but otherwise nonfocal  IMPRESSION: Routine follow-up.  Patient doing well.  Performance status has improved.  Appetite is good and weight is stable.  No significant symptomatic complaints at present.  Patient reports improved pain after starting MS Contin but he does have some fatigue.  Fatigue is likely multifactorial in setting of treatment effect, pancytopenia is, and/or pain meds. He is now only requiring oxycodone once daily for breakthrough pain.   Patient is in agreement to continue current pain regimen.   DME: continue hospital bed, which patient requires due to frequent repositioning in the context of neoplasm related pain.  PLAN: -Continue current scope of treatment -Continue MS Contin/oxycodone -Daily bowel regimen -RTC 1 month     Durable  Medical Equipment  (From admission, onward)           Start     Ordered   10/13/21 0000  For home use only DME Hospital bed       Question Answer Comment  Length of Need Lifetime   Patient has (list medical condition): weakness, myeloma, pain   The above medical condition requires: Patient requires the ability to reposition frequently   Head must be elevated greater than: 30 degrees   Bed type Semi-electric   Support Surface: Gel Overlay      10/13/21 1541              Patient expressed understanding and was in agreement with this plan. He also understands that He can call the clinic at any time with any questions, concerns, or complaints.     Time Total: 15 minutes  Visit consisted of counseling and education dealing with the complex and emotionally intense issues of symptom management and palliative care in the setting of serious and potentially life-threatening illness.Greater than 50%  of this time was spent counseling and coordinating care related to the above assessment and plan.  Signed by: Altha Harm, PhD, NP-C

## 2021-10-13 NOTE — Progress Notes (Signed)
Here for palliative care follow-up. Reports that his energy level is low, and he is not able to walk as much as he used to. Rates pain level 4/10, but states it is much better than it was. States appetite is fair.

## 2021-10-14 ENCOUNTER — Other Ambulatory Visit: Payer: Self-pay | Admitting: *Deleted

## 2021-10-14 DIAGNOSIS — X32XXXA Exposure to sunlight, initial encounter: Secondary | ICD-10-CM | POA: Diagnosis not present

## 2021-10-14 DIAGNOSIS — L570 Actinic keratosis: Secondary | ICD-10-CM | POA: Diagnosis not present

## 2021-10-14 LAB — KAPPA/LAMBDA LIGHT CHAINS
Kappa free light chain: 87.1 mg/L — ABNORMAL HIGH (ref 3.3–19.4)
Kappa, lambda light chain ratio: 8.14 — ABNORMAL HIGH (ref 0.26–1.65)
Lambda free light chains: 10.7 mg/L (ref 5.7–26.3)

## 2021-10-14 MED ORDER — MORPHINE SULFATE ER 15 MG PO TBCR: 15.0000 mg | EXTENDED_RELEASE_TABLET | Freq: Two times a day (BID) | ORAL | 0 refills | Status: DC

## 2021-10-14 NOTE — Telephone Encounter (Signed)
Rcvd incoming msg from pt's wife. Jack [name]- I pended the script to not RF to Monday, 2/6 unless you feel that script can be filled sooner   Wife's msg:  Can Jack [name] refill morphine sulf ER  15 mg  we will need it early next week.  We originally had a 15 day supply. Thanks.  If its too early, Monday will be fine. Thanks.

## 2021-10-15 ENCOUNTER — Encounter: Payer: Self-pay | Admitting: Oncology

## 2021-10-16 ENCOUNTER — Other Ambulatory Visit: Payer: Self-pay | Admitting: Emergency Medicine

## 2021-10-16 ENCOUNTER — Encounter: Payer: Self-pay | Admitting: Oncology

## 2021-10-16 DIAGNOSIS — Z515 Encounter for palliative care: Secondary | ICD-10-CM

## 2021-10-16 DIAGNOSIS — G893 Neoplasm related pain (acute) (chronic): Secondary | ICD-10-CM

## 2021-10-17 LAB — IMMUNOGLOBULINS A/E/G/M, SERUM
IgA: 82 mg/dL (ref 61–437)
IgE (Immunoglobulin E), Serum: <2 IU/mL — ABNORMAL LOW (ref 6–495)
IgG (Immunoglobin G), Serum: 504 mg/dL — ABNORMAL LOW (ref 603–1613)
IgM (Immunoglobulin M), Srm: 23 mg/dL (ref 15–143)

## 2021-10-19 DIAGNOSIS — H43812 Vitreous degeneration, left eye: Secondary | ICD-10-CM | POA: Diagnosis not present

## 2021-10-19 DIAGNOSIS — H353231 Exudative age-related macular degeneration, bilateral, with active choroidal neovascularization: Secondary | ICD-10-CM | POA: Diagnosis not present

## 2021-10-19 DIAGNOSIS — H35033 Hypertensive retinopathy, bilateral: Secondary | ICD-10-CM | POA: Diagnosis not present

## 2021-10-20 ENCOUNTER — Other Ambulatory Visit: Payer: Self-pay

## 2021-10-20 ENCOUNTER — Inpatient Hospital Stay: Payer: PPO | Attending: Oncology

## 2021-10-20 ENCOUNTER — Encounter: Payer: Self-pay | Admitting: Pharmacist

## 2021-10-20 ENCOUNTER — Other Ambulatory Visit: Payer: Self-pay | Admitting: Pharmacist

## 2021-10-20 DIAGNOSIS — C9000 Multiple myeloma not having achieved remission: Secondary | ICD-10-CM

## 2021-10-20 LAB — CBC WITH DIFFERENTIAL/PLATELET
Abs Immature Granulocytes: 0.01 10*3/uL (ref 0.00–0.07)
Basophils Absolute: 0 10*3/uL (ref 0.0–0.1)
Basophils Relative: 0 %
Eosinophils Absolute: 0 10*3/uL (ref 0.0–0.5)
Eosinophils Relative: 1 %
HCT: 37 % — ABNORMAL LOW (ref 39.0–52.0)
Hemoglobin: 12 g/dL — ABNORMAL LOW (ref 13.0–17.0)
Immature Granulocytes: 0 %
Lymphocytes Relative: 48 %
Lymphs Abs: 1.1 10*3/uL (ref 0.7–4.0)
MCH: 31.3 pg (ref 26.0–34.0)
MCHC: 32.4 g/dL (ref 30.0–36.0)
MCV: 96.4 fL (ref 80.0–100.0)
Monocytes Absolute: 0.3 10*3/uL (ref 0.1–1.0)
Monocytes Relative: 13 %
Neutro Abs: 0.9 10*3/uL — ABNORMAL LOW (ref 1.7–7.7)
Neutrophils Relative %: 38 %
Platelets: 122 10*3/uL — ABNORMAL LOW (ref 150–400)
RBC: 3.84 MIL/uL — ABNORMAL LOW (ref 4.22–5.81)
RDW: 16 % — ABNORMAL HIGH (ref 11.5–15.5)
WBC: 2.4 10*3/uL — ABNORMAL LOW (ref 4.0–10.5)
nRBC: 0 % (ref 0.0–0.2)

## 2021-10-20 LAB — COMPREHENSIVE METABOLIC PANEL
ALT: 11 U/L (ref 0–44)
AST: 19 U/L (ref 15–41)
Albumin: 3.6 g/dL (ref 3.5–5.0)
Alkaline Phosphatase: 74 U/L (ref 38–126)
Anion gap: 6 (ref 5–15)
BUN: 24 mg/dL — ABNORMAL HIGH (ref 8–23)
CO2: 25 mmol/L (ref 22–32)
Calcium: 8.7 mg/dL — ABNORMAL LOW (ref 8.9–10.3)
Chloride: 104 mmol/L (ref 98–111)
Creatinine, Ser: 0.95 mg/dL (ref 0.61–1.24)
GFR, Estimated: >60 mL/min (ref >60)
Glucose, Bld: 98 mg/dL (ref 70–99)
Potassium: 4.3 mmol/L (ref 3.5–5.1)
Sodium: 135 mmol/L (ref 135–145)
Total Bilirubin: 0.4 mg/dL (ref 0.3–1.2)
Total Protein: 5.9 g/dL — ABNORMAL LOW (ref 6.5–8.1)

## 2021-10-21 LAB — KAPPA/LAMBDA LIGHT CHAINS
Kappa free light chain: 108.4 mg/L — ABNORMAL HIGH (ref 3.3–19.4)
Kappa, lambda light chain ratio: 10.23 — ABNORMAL HIGH (ref 0.26–1.65)
Lambda free light chains: 10.6 mg/L (ref 5.7–26.3)

## 2021-10-26 ENCOUNTER — Other Ambulatory Visit: Payer: PPO

## 2021-10-26 LAB — IMMUNOGLOBULINS A/E/G/M, SERUM
IgA: 84 mg/dL (ref 61–437)
IgE (Immunoglobulin E), Serum: <2 IU/mL — ABNORMAL LOW (ref 6–495)
IgG (Immunoglobin G), Serum: 477 mg/dL — ABNORMAL LOW (ref 603–1613)
IgM (Immunoglobulin M), Srm: 24 mg/dL (ref 15–143)

## 2021-10-27 ENCOUNTER — Encounter: Payer: Self-pay | Admitting: Oncology

## 2021-10-27 ENCOUNTER — Other Ambulatory Visit: Payer: Self-pay | Admitting: Emergency Medicine

## 2021-10-27 ENCOUNTER — Encounter: Payer: Self-pay | Admitting: Emergency Medicine

## 2021-10-27 DIAGNOSIS — C9000 Multiple myeloma not having achieved remission: Secondary | ICD-10-CM

## 2021-10-28 ENCOUNTER — Telehealth: Payer: PPO | Admitting: Hospice and Palliative Medicine

## 2021-10-29 ENCOUNTER — Telehealth: Payer: PPO | Admitting: Hospice and Palliative Medicine

## 2021-10-30 DIAGNOSIS — N400 Enlarged prostate without lower urinary tract symptoms: Secondary | ICD-10-CM | POA: Diagnosis not present

## 2021-10-30 DIAGNOSIS — E785 Hyperlipidemia, unspecified: Secondary | ICD-10-CM | POA: Diagnosis not present

## 2021-10-30 DIAGNOSIS — I10 Essential (primary) hypertension: Secondary | ICD-10-CM | POA: Diagnosis not present

## 2021-10-30 DIAGNOSIS — C9000 Multiple myeloma not having achieved remission: Secondary | ICD-10-CM | POA: Diagnosis not present

## 2021-10-30 DIAGNOSIS — Z0000 Encounter for general adult medical examination without abnormal findings: Secondary | ICD-10-CM | POA: Diagnosis not present

## 2021-10-30 DIAGNOSIS — S22080S Wedge compression fracture of T11-T12 vertebra, sequela: Secondary | ICD-10-CM | POA: Diagnosis not present

## 2021-11-03 ENCOUNTER — Other Ambulatory Visit: Payer: Self-pay | Admitting: *Deleted

## 2021-11-03 DIAGNOSIS — C9000 Multiple myeloma not having achieved remission: Secondary | ICD-10-CM

## 2021-11-04 ENCOUNTER — Encounter: Payer: Self-pay | Admitting: Internal Medicine

## 2021-11-04 ENCOUNTER — Encounter: Payer: Self-pay | Admitting: Oncology

## 2021-11-04 MED ORDER — LENALIDOMIDE 20 MG PO CAPS: 20.0000 mg | ORAL_CAPSULE | Freq: Every day | ORAL | 0 refills | Status: DC

## 2021-11-05 ENCOUNTER — Telehealth: Payer: Self-pay | Admitting: *Deleted

## 2021-11-05 ENCOUNTER — Other Ambulatory Visit: Payer: Self-pay | Admitting: Emergency Medicine

## 2021-11-05 DIAGNOSIS — C9000 Multiple myeloma not having achieved remission: Secondary | ICD-10-CM

## 2021-11-05 MED ORDER — LENALIDOMIDE 15 MG PO CAPS: 15.0000 mg | ORAL_CAPSULE | Freq: Every day | ORAL | 0 refills | Status: DC

## 2021-11-05 NOTE — Telephone Encounter (Signed)
Pharmacy called stating that patient wife said that his Revlimid dose was to be reduced from 20 mg to 15 mg and we refilled for 20 mg again per documentation from A leonard, Pharmacist "He is okay to resume his Revlimid 20mg  tomorrow, with the follow cycle we will reduce him down to Revlimid 15 mg.". Please reorder correct dose and resend to [facility]

## 2021-11-05 NOTE — Telephone Encounter (Signed)
Tiffany, RN sent in updated rx for 15mg  Revlimid to [place] and I called [facility] to update the dose associated with the [employer] auth number.

## 2021-11-10 ENCOUNTER — Encounter: Payer: PPO | Admitting: Hospice and Palliative Medicine

## 2021-11-10 ENCOUNTER — Ambulatory Visit: Payer: PPO | Admitting: Pharmacist

## 2021-11-10 DIAGNOSIS — C9000 Multiple myeloma not having achieved remission: Secondary | ICD-10-CM | POA: Diagnosis not present

## 2021-11-10 DIAGNOSIS — J9601 Acute respiratory failure with hypoxia: Secondary | ICD-10-CM | POA: Diagnosis not present

## 2021-11-10 DIAGNOSIS — M8440XA Pathological fracture, unspecified site, initial encounter for fracture: Secondary | ICD-10-CM | POA: Diagnosis not present

## 2021-11-10 DIAGNOSIS — S22000A Wedge compression fracture of unspecified thoracic vertebra, initial encounter for closed fracture: Secondary | ICD-10-CM | POA: Diagnosis not present

## 2021-11-12 NOTE — Progress Notes (Signed)
[facility]  Telephone:([phone]) [phone] Fax:(336) [phone]  ID: Jack [name] OB: 1933/07/05  MR#: [id number]  VZD#:[id number]  Patient Care Team: [name], MD as PCP - General (Family Medicine) End, [name], MD as PCP - Cardiology (Cardiology) [name], MD (Endocrinology) Lloyd Huger, MD as Consulting Physician (Hematology and Oncology)   CHIEF COMPLAINT: Stage II kappa chain myeloma.  INTERVAL HISTORY: Patient returns to clinic today for repeat laboratory work, further evaluation, and continuation of dose reduced Revlimid and Xgeva.  He continues to have chronic back pain, but otherwise continues to improve and be more active.  He has a good appetite and his weight is remained stable.  He continues to tolerate Revlimid well without significant side effects.  He has no neurologic complaints.  He denies any recent fevers or illnesses.  He has no chest pain, shortness of breath, cough, or hemoptysis.  He denies any nausea, vomiting, constipation, or diarrhea.  He has no urinary complaints.  Patient offers no further specific complaints today.  REVIEW OF SYSTEMS:   Review of Systems  Constitutional:  Positive for malaise/fatigue. Negative for fever.  Respiratory: Negative.  Negative for cough, hemoptysis and shortness of breath.   Cardiovascular: Negative.  Negative for chest pain and leg swelling.  Gastrointestinal: Negative.  Negative for abdominal pain.  Genitourinary: Negative.  Negative for dysuria and flank pain.  Musculoskeletal:  Positive for back pain. Negative for joint pain.  Skin: Negative.  Negative for rash.  Neurological:  Positive for weakness. Negative for dizziness, focal weakness and headaches.  Psychiatric/Behavioral: Negative.  The patient is not nervous/anxious.    As per HPI. Otherwise, a complete review of systems is negative.  PAST MEDICAL HISTORY: Past Medical History:  Diagnosis Date   Elevated prostate  specific antigen (PSA)    has been 7 for a year    GERD (gastroesophageal reflux disease)    History of colon polyps 2008   [facility],    History of kidney stones    Hyperlipidemia    Hypertension    Prostate hypertrophy    diagnosed at age 77 due to hematospermia   Renal disorder     PAST SURGICAL HISTORY: Past Surgical History:  Procedure Laterality Date   CATARACT EXTRACTION W/PHACO Left 01/10/2018   Procedure: CATARACT EXTRACTION PHACO AND INTRAOCULAR LENS PLACEMENT ([place]);  Surgeon: Birder Robson, MD;  Location: [facility] OR[facility];  Service: Ophthalmology;  Laterality: Left;  [name] 00:24.8 AP% 14.9 CDE 3.68 Fluid pack lot # [id number]   CATARACT EXTRACTION W/PHACO Right 01/25/2018   Procedure: CATARACT EXTRACTION PHACO AND INTRAOCULAR LENS PLACEMENT ([facility]);  Surgeon: Birder Robson, MD;  Location: [facility];  Service: Ophthalmology;  Laterality: Right;  [name] 00:42 AP% 10.8 CDE 4.59 Fluid pack lot # [id number]   COLON SURGERY     CYSTOSCOPY W/ URETERAL STENT PLACEMENT Right 10/16/2017   Procedure: right  URETERAL STENT PLACEMENT,cystoscopy bilateral stent removal,rretrograde;  Surgeon: Abbie Sons, MD;  Location: [facility];  Service: Urology;  Laterality: Right;   CYSTOSCOPY/URETEROSCOPY/HOLMIUM LASER/STENT PLACEMENT Right 12/16/2020   Procedure: CYSTOSCOPY/URETEROSCOPY/HOLMIUM LASER/STENT PLACEMENT;  Surgeon: Abbie Sons, MD;  Location: [facility];  Service: Urology;  Laterality: Right;   EXTRACORPOREAL SHOCK WAVE LITHOTRIPSY Right 12/11/2020   Procedure: EXTRACORPOREAL SHOCK WAVE LITHOTRIPSY (ESWL);  Surgeon: Abbie Sons, MD;  Location: [facility];  Service: Urology;  Laterality: Right;   IR KYPHO EA ADDL LEVEL THORACIC OR LUMBAR  02/02/2021   IR KYPHO LUMBAR INC FX REDUCE BONE BX  UNI/BIL CANNULATION INC/IMAGING  02/02/2021   KIDNEY STONE SURGERY     KYPHOPLASTY N/A 03/12/2021   Procedure: Hewitt Shorts, L3;  Surgeon: Hessie Knows, MD;  Location: [facility];  Service: Orthopedics;   Laterality: N/A;   RESECTION SOFT TISSUE TUMOR LEG / ANKLE RADICAL  jan 2009   [facility],  right thigh/knee , nonmalignant   SMALL INTESTINE SURGERY  1946   implaed on picket fence, punctured stomach   TONSILLECTOMY      FAMILY HISTORY: Family History  Problem Relation Age of Onset   Hypertension Father    Hyperlipidemia Father    Cancer Sister        thyroid - dx in late 20's    ADVANCED DIRECTIVES (Y/N):  N  HEALTH MAINTENANCE: Social History   Tobacco Use   Smoking status: Former    Types: Cigarettes    Quit date: 07/05/1965    Years since quitting: 56.4   Smokeless tobacco: Never  Vaping Use   Vaping Use: Never used  Substance Use Topics   Alcohol use: Yes    Alcohol/week: 1.0 standard drink    Types: 1 Standard drinks or equivalent per week    Comment: occassionaly   Drug use: No     Colonoscopy:  PAP:  Bone density:  Lipid panel:  Allergies  Allergen Reactions   Quinolones     Aortic dilation contraindicates FQ   Amlodipine Other (See Comments)    LE edema   Azithromycin Nausea And Vomiting   Lisinopril Cough   Morphine And Related Nausea And Vomiting    Current Outpatient Medications  Medication Sig Dispense Refill   atorvastatin (LIPITOR) 40 MG tablet Take 1 tablet (40 mg total) by mouth daily at 6 PM. (Patient taking differently: Take 40 mg by mouth. 2 times a week) 90 tablet 3   Calcium Carbonate-Vit D-Min (CALCIUM 1200 PO) Take by mouth.     carvedilol (COREG) 25 MG tablet Take 1 tablet (25 mg total) by mouth 2 (two) times daily with a meal. (Patient taking differently: Take 25 mg by mouth daily.) 60 tablet 1   dexamethasone (DECADRON) 4 MG tablet TAKE 5 TABLETS BY MOUTH ONCE A WEEK. 20 tablet 4   ELIQUIS 5 MG TABS tablet TAKE 1 TABLET (5 MG TOTAL) BY MOUTH 2 (TWO) TIMES DAILY. 180 tablet 0   feeding supplement (ENSURE ENLIVE / ENSURE PLUS) LIQD Take 237 mLs by mouth 3 (three) times daily between meals. 237 mL 12   gabapentin (NEURONTIN) 300 MG  capsule TAKE 2 CAPSULES (600 MG TOTAL) BY MOUTH 3 (THREE) TIMES DAILY. (Patient taking differently: 2 (two) times daily.) 180 capsule 2   lenalidomide (REVLIMID) 15 MG capsule Take 1 capsule (15 mg total) by mouth daily. Take for 21 days, then hold for 7 days. Repeat every 28 days. 21 capsule 0   lidocaine (XYLOCAINE) 5 % ointment Apply topically 3 (three) times daily as needed for mild pain or moderate pain. 35.44 g 0   Multiple Vitamin (MULTIVITAMIN WITH MINERALS) TABS tablet Take 1 tablet by mouth daily.     Multiple Vitamins-Minerals (PRESERVISION AREDS PO) Take by mouth.     polyethylene glycol (MIRALAX / GLYCOLAX) 17 g packet Take 17 g by mouth 2 (two) times daily.  0   potassium citrate (UROCIT-K) 10 MEQ (1080 MG) SR tablet Take 1 tablet (10 mEq total) by mouth 3 (three) times daily with meals. 90 tablet 6   senna-docusate (SENOKOT-S) 8.6-50 MG tablet Take 1 tablet by  mouth 2 (two) times daily. 30 tablet 0   tamsulosin (FLOMAX) 0.4 MG CAPS capsule TAKE 1 CAPSULE BY MOUTH EVERY DAY 90 capsule 1   calcitonin, salmon, (MIACALCIN/FORTICAL) 200 UNIT/ACT nasal spray Place 1 spray into alternate nostrils daily. (Patient not taking: Reported on 08/11/2021) 3.7 mL 12   docusate sodium (COLACE) 100 MG capsule Take 1 capsule (100 mg total) by mouth 2 (two) times daily. (Patient not taking: Reported on 11/17/2021) 60 capsule 0   fluticasone (FLONASE) 50 MCG/ACT nasal spray Place into both nostrils daily. (Patient not taking: Reported on 11/17/2021)     morphine (MS CONTIN) 15 MG 12 hr tablet Take 1 tablet (15 mg total) by mouth every 12 (twelve) hours. 60 tablet 0   naloxone (NARCAN) nasal spray 4 mg/0.1 mL SPRAY 1 SPRAY INTO ONE NOSTRIL AS DIRECTED FOR OPIOID OVERDOSE (TURN PERSON ON SIDE AFTER DOSE. IF NO RESPONSE IN 2-3 MINUTES OR PERSON RESPONDS BUT RELAPSES, REPEAT USING A NEW SPRAY DEVICE AND SPRAY INTO THE OTHER NOSTRIL. CALL 911 AFTER USE.) * EMERGENCY USE ONLY * (Patient not taking: Reported on  08/11/2021) 1 each 0   ondansetron (ZOFRAN) 8 MG tablet Take 1 tablet (8 mg total) by mouth every 8 (eight) hours as needed for nausea or vomiting. (Patient not taking: Reported on 11/17/2021) 60 tablet 2   oxyCODONE (OXY IR/ROXICODONE) 5 MG immediate release tablet Take 1-2 tablets (5-10 mg total) by mouth every 4 (four) hours as needed for severe pain. 90 tablet 0   valACYclovir (VALTREX) 500 MG tablet Take 500 mg by mouth 2 (two) times daily. (Patient not taking: Reported on 09/24/2021)     No current facility-administered medications for this visit.    OBJECTIVE: Vitals:   11/17/21 1432  BP: 136/77  Pulse: (!) 46  Resp: 16  Temp: 98.2 F (36.8 C)  SpO2: 99%      Body mass index is 24.4 kg/m.    ECOG FS:1 - Symptomatic but completely ambulatory  General: Well-developed, well-nourished, no acute distress. Eyes: Pink conjunctiva, anicteric sclera. HEENT: Normocephalic, moist mucous membranes. Lungs: No audible wheezing or coughing. Heart: Regular rate and rhythm. Abdomen: Soft, nontender, no obvious distention. Musculoskeletal: No edema, cyanosis, or clubbing. Neuro: Alert, answering all questions appropriately. Cranial nerves grossly intact. Skin: No rashes or petechiae noted. Psych: Normal affect.   LAB RESULTS:  Lab Results  Component Value Date   NA 139 11/17/2021   K 4.4 11/17/2021   CL 105 11/17/2021   CO2 28 11/17/2021   GLUCOSE 103 (H) 11/17/2021   BUN 32 (H) 11/17/2021   CREATININE 1.14 11/17/2021   CALCIUM 9.0 11/17/2021   PROT 5.8 (L) 11/17/2021   ALBUMIN 3.6 11/17/2021   AST 23 11/17/2021   ALT 15 11/17/2021   ALKPHOS 71 11/17/2021   BILITOT 0.4 11/17/2021   GFRNONAA >60 11/17/2021   GFRAA 29 (L) 10/16/2017    Lab Results  Component Value Date   WBC 5.5 11/17/2021   NEUTROABS 3.0 11/17/2021   HGB 12.7 (L) 11/17/2021   HCT 38.9 (L) 11/17/2021   MCV 96.8 11/17/2021   PLT 153 11/17/2021     STUDIES: No results found.  ASSESSMENT: Stage II  Kappa chain myeloma.  PLAN:    Stage II kappa chain myeloma: (11:14 translocation, high risk) SPEP essentially negative and immunoglobulins are within normal limits.  Patient's kappa light chains have improved to greater than 5400 down to 86.9.  Today's result is pending.  Cycle 1 included Velcade and Revlimid,  but given his declining performance status and difficulty to get into clinic this was transitioned to Revlimid only.  His performance status is improved and he is now able to return to clinic for in person evaluation.  Given persistent neutropenia, Revlimid has been dose reduced to 15 mg daily for 21 days with 7 days off.  He is also taking 20 mg dexamethasone weekly.  Proceed with Xgeva today.  If patient's counts remain stable, can increase Revlimid dose back to 20 mg.  Return to clinic in 3 weeks for laboratory work and PET scan.  Patient will then return to clinic in 4 weeks for further evaluation and continuation of treatment.  Appreciate palliative care and clinical pharmacy input.   Back pain: Patient has not had any imaging since July 2022, therefore will repeat PET scan as above.  Can also consider thoracic/lumbar MRI if PET scan is inconclusive.  Continue current narcotic and gabapentin prescriptions.   Anemia: Hemoglobin improved to 12.7, monitor.   Right flank mass: MRI results from July 13, 2021 reviewed independently with no obvious pathology on his right flank, but did reveal several fractured ribs.  Continue narcotic regimen as above. Bone lesions: Continue Xgeva as above. Neutropenia: Resolved with dose reduction of Revlimid.  Patient expressed understanding and was in agreement with this plan. He also understands that He can call clinic at any time with any questions, concerns, or complaints.    Cancer Staging  Kappa light chain myeloma ([place]) Staging form: Plasma Cell Myeloma and Plasma Cell Disorders, AJCC 8th Edition - Clinical stage from 04/16/2021: Albumin (g/dL): 3.8,  ISS: Stage II, High-risk cytogenetics: Absent, LDH: Unknown - Signed by Lloyd Huger, MD on 04/16/2021 Albumin range (g/dL): Greater than or equal to 3.5 Cytogenetics: t(11;14) translocation Serum calcium level: Normal Serum creatinine level: Normal Bone disease on imaging: Present  Lloyd Huger, MD   11/17/2021 3:58 PM

## 2021-11-17 ENCOUNTER — Other Ambulatory Visit: Payer: Self-pay

## 2021-11-17 ENCOUNTER — Inpatient Hospital Stay: Payer: PPO | Admitting: Pharmacist

## 2021-11-17 ENCOUNTER — Inpatient Hospital Stay: Payer: PPO | Attending: Oncology

## 2021-11-17 ENCOUNTER — Inpatient Hospital Stay (HOSPITAL_BASED_OUTPATIENT_CLINIC_OR_DEPARTMENT_OTHER): Payer: PPO | Admitting: Hospice and Palliative Medicine

## 2021-11-17 ENCOUNTER — Inpatient Hospital Stay (HOSPITAL_BASED_OUTPATIENT_CLINIC_OR_DEPARTMENT_OTHER): Payer: PPO | Admitting: Oncology

## 2021-11-17 ENCOUNTER — Inpatient Hospital Stay: Payer: PPO

## 2021-11-17 VITALS — BP 136/77 | HR 46 | Temp 98.2°F | Resp 16 | Wt 165.2 lb

## 2021-11-17 DIAGNOSIS — C9000 Multiple myeloma not having achieved remission: Secondary | ICD-10-CM

## 2021-11-17 DIAGNOSIS — R5383 Other fatigue: Secondary | ICD-10-CM | POA: Insufficient documentation

## 2021-11-17 DIAGNOSIS — R5381 Other malaise: Secondary | ICD-10-CM | POA: Diagnosis not present

## 2021-11-17 DIAGNOSIS — G893 Neoplasm related pain (acute) (chronic): Secondary | ICD-10-CM | POA: Diagnosis not present

## 2021-11-17 DIAGNOSIS — D649 Anemia, unspecified: Secondary | ICD-10-CM | POA: Insufficient documentation

## 2021-11-17 DIAGNOSIS — M549 Dorsalgia, unspecified: Secondary | ICD-10-CM | POA: Diagnosis not present

## 2021-11-17 DIAGNOSIS — R531 Weakness: Secondary | ICD-10-CM | POA: Diagnosis not present

## 2021-11-17 DIAGNOSIS — G8929 Other chronic pain: Secondary | ICD-10-CM | POA: Insufficient documentation

## 2021-11-17 DIAGNOSIS — R1900 Intra-abdominal and pelvic swelling, mass and lump, unspecified site: Secondary | ICD-10-CM | POA: Insufficient documentation

## 2021-11-17 DIAGNOSIS — Z87891 Personal history of nicotine dependence: Secondary | ICD-10-CM | POA: Diagnosis not present

## 2021-11-17 LAB — CBC WITH DIFFERENTIAL/PLATELET
Abs Immature Granulocytes: 0.02 10*3/uL (ref 0.00–0.07)
Basophils Absolute: 0.1 10*3/uL (ref 0.0–0.1)
Basophils Relative: 2 %
Eosinophils Absolute: 0 10*3/uL (ref 0.0–0.5)
Eosinophils Relative: 1 %
HCT: 38.9 % — ABNORMAL LOW (ref 39.0–52.0)
Hemoglobin: 12.7 g/dL — ABNORMAL LOW (ref 13.0–17.0)
Immature Granulocytes: 0 %
Lymphocytes Relative: 27 %
Lymphs Abs: 1.5 10*3/uL (ref 0.7–4.0)
MCH: 31.6 pg (ref 26.0–34.0)
MCHC: 32.6 g/dL (ref 30.0–36.0)
MCV: 96.8 fL (ref 80.0–100.0)
Monocytes Absolute: 0.8 10*3/uL (ref 0.1–1.0)
Monocytes Relative: 15 %
Neutro Abs: 3 10*3/uL (ref 1.7–7.7)
Neutrophils Relative %: 55 %
Platelets: 153 10*3/uL (ref 150–400)
RBC: 4.02 MIL/uL — ABNORMAL LOW (ref 4.22–5.81)
RDW: 16.4 % — ABNORMAL HIGH (ref 11.5–15.5)
WBC: 5.5 10*3/uL (ref 4.0–10.5)
nRBC: 0 % (ref 0.0–0.2)

## 2021-11-17 LAB — COMPREHENSIVE METABOLIC PANEL
ALT: 15 U/L (ref 0–44)
AST: 23 U/L (ref 15–41)
Albumin: 3.6 g/dL (ref 3.5–5.0)
Alkaline Phosphatase: 71 U/L (ref 38–126)
Anion gap: 6 (ref 5–15)
BUN: 32 mg/dL — ABNORMAL HIGH (ref 8–23)
CO2: 28 mmol/L (ref 22–32)
Calcium: 9 mg/dL (ref 8.9–10.3)
Chloride: 105 mmol/L (ref 98–111)
Creatinine, Ser: 1.14 mg/dL (ref 0.61–1.24)
GFR, Estimated: >60 mL/min (ref >60)
Glucose, Bld: 103 mg/dL — ABNORMAL HIGH (ref 70–99)
Potassium: 4.4 mmol/L (ref 3.5–5.1)
Sodium: 139 mmol/L (ref 135–145)
Total Bilirubin: 0.4 mg/dL (ref 0.3–1.2)
Total Protein: 5.8 g/dL — ABNORMAL LOW (ref 6.5–8.1)

## 2021-11-17 MED ORDER — DENOSUMAB 120 MG/1.7ML ~~LOC~~ SOLN
120.0000 mg | Freq: Once | SUBCUTANEOUS | Status: AC
  Administered 2021-11-17: 120 mg via SUBCUTANEOUS
  Filled 2021-11-17: qty 1.7

## 2021-11-17 MED ORDER — MORPHINE SULFATE ER 15 MG PO TBCR: 15.0000 mg | EXTENDED_RELEASE_TABLET | Freq: Two times a day (BID) | ORAL | 0 refills | Status: DC

## 2021-11-17 MED ORDER — OXYCODONE HCL 5 MG PO TABS: 5.0000 mg | ORAL_TABLET | ORAL | 0 refills | Status: DC | PRN

## 2021-11-17 NOTE — Progress Notes (Signed)
[place] [facility] Telephone:(336) [phone] [phone]) [phone]   Name: Jack [name] Date: 11/17/2021 MRN: [id number]  DOB: 12-10-1932  Patient Care Team: [name], MD as PCP - General (Family Medicine) End, [name], MD as PCP - Cardiology (Cardiology) [name], MD (Endocrinology) Lloyd Huger, MD as Consulting Physician (Hematology and Oncology)    REASON FOR CONSULTATION: Jack [name] is a 88 y.o. male with multiple medical problems including hypertension, CKD stage IIIb, A. fib on Eliquis, and anemia.  Patient was hospitalized 01/02/2021 to 01/07/2021 with T12 compression fracture with recommendation for conservative management including use of a TLSO brace.  Patient was hospitalized again 01/29/2021 to 02/07/2021 with recurrent back pain and MR showing T12 and new L1 compression fracture.  Patient underwent T12/L1 kyphoplasty on 02/02/2021.  There was also suspicion of a left fifth rib fracture, which occurred while transitioning from the stretcher for his kyphoplasty.  Patient had mild hypercalcemia with SPEP and UPEP concerning for possible myeloma.  Patient was referred to [facility] for work-up.  He was readmitted 03/11/2021 - 03/17/2021 with worsening back pain.  CT revealed new compression fractures of L2 and L3.  Patient underwent kyphoplasty on 6/30.  He was hospitalized again 03/26/2021-04/07/2021 with severe back/flank pain.  MRI revealed T10 pathologic compression fracture.  He was started on treatment for multiple myeloma with dexamethasone/Velcade and XRT to spine.  Palliative care was consulted to help address goals and manage ongoing symptoms.   SOCIAL HISTORY:     reports that he quit smoking about 56 years ago. His smoking use included cigarettes. He has never used smokeless tobacco. He reports current alcohol use of about 1.0 standard drink per week. He reports that he does not use  drugs.  Patient lives at [facility] in [place] with his wife of 19 years.  Patient has a son and daughter who are also involved in his care.  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  ADVANCE DIRECTIVES:  On file  CODE STATUS:   PAST MEDICAL HISTORY: Past Medical History:  Diagnosis Date   Elevated prostate specific antigen (PSA)    has been 7 for a year    GERD (gastroesophageal reflux disease)    History of colon polyps 2008   [facility],    History of kidney stones    Hyperlipidemia    Hypertension    Prostate hypertrophy    diagnosed at age 69 due to hematospermia   Renal disorder     PAST SURGICAL HISTORY:  Past Surgical History:  Procedure Laterality Date   CATARACT EXTRACTION W/PHACO Left 01/10/2018   Procedure: CATARACT EXTRACTION PHACO AND INTRAOCULAR LENS PLACEMENT ([place]);  Surgeon: Birder Robson, MD;  Location: [facility] ORS;  Service: Ophthalmology;  Laterality: Left;  [name] 00:24.8 AP% 14.9 CDE 3.68 Fluid pack lot # [id number]   CATARACT EXTRACTION W/PHACO Right 01/25/2018   Procedure: CATARACT EXTRACTION PHACO AND INTRAOCULAR LENS PLACEMENT ([facility]);  Surgeon: Birder Robson, MD;  Location: [facility] ORS;  Service: Ophthalmology;  Laterality: Right;  [name] 00:42 AP% 10.8 CDE 4.59 Fluid pack lot # [id number]   COLON SURGERY     CYSTOSCOPY W/ URETERAL STENT PLACEMENT Right 10/16/2017   Procedure: right  URETERAL STENT PLACEMENT,cystoscopy bilateral stent removal,rretrograde;  Surgeon: Abbie Sons, MD;  Location: [facility] ORS;  Service: Urology;  Laterality: Right;   CYSTOSCOPY/URETEROSCOPY/HOLMIUM LASER/STENT PLACEMENT Right 12/16/2020   Procedure: CYSTOSCOPY/URETEROSCOPY/HOLMIUM LASER/STENT PLACEMENT;  Surgeon: Abbie Sons, MD;  Location: [facility] ORS;  Service: Urology;  Laterality: Right;   EXTRACORPOREAL SHOCK WAVE LITHOTRIPSY Right 12/11/2020   Procedure: EXTRACORPOREAL SHOCK WAVE LITHOTRIPSY (ESWL);  Surgeon: Abbie Sons, MD;   Location: [facility] ORS;  Service: Urology;  Laterality: Right;   IR KYPHO EA ADDL LEVEL THORACIC OR LUMBAR  02/02/2021   IR KYPHO LUMBAR INC FX REDUCE BONE BX UNI/BIL CANNULATION INC/IMAGING  02/02/2021   KIDNEY STONE SURGERY     KYPHOPLASTY N/A 03/12/2021   Procedure: Hewitt Shorts, L3;  Surgeon: Hessie Knows, MD;  Location: [facility] ORS;  Service: Orthopedics;  Laterality: N/A;   RESECTION SOFT TISSUE TUMOR LEG / ANKLE RADICAL  jan 2009   [facility],  right thigh/knee , nonmalignant   SMALL INTESTINE SURGERY  1946   implaed on picket fence, punctured stomach   TONSILLECTOMY      HEMATOLOGY/ONCOLOGY HISTORY:  Oncology History  Kappa light chain myeloma ([place])  03/12/2021 Initial Diagnosis   Kappa light chain myeloma ([place])   03/27/2021 -  Chemotherapy    Patient is on Treatment Plan: MYELOMA NON-TRANSPLANT CANDIDATES VRD SQ Q21D       04/16/2021 Cancer Staging   Staging form: Plasma Cell Myeloma and Plasma Cell Disorders, AJCC 8th Edition - Clinical stage from 04/16/2021: Albumin (g/dL): 3.8, ISS: Stage II, High-risk cytogenetics: Absent, LDH: Unknown - Signed by Lloyd Huger, MD on 04/16/2021 Albumin range (g/dL): Greater than or equal to 3.5 Cytogenetics: t(11;14) translocation Serum calcium level: Normal Serum creatinine level: Normal Bone disease on imaging: Present     ALLERGIES:  is allergic to quinolones, amlodipine, azithromycin, lisinopril, and morphine and related.  MEDICATIONS:  Current Outpatient Medications  Medication Sig Dispense Refill   atorvastatin (LIPITOR) 40 MG tablet Take 1 tablet (40 mg total) by mouth daily at 6 PM. (Patient taking differently: Take 40 mg by mouth. 2 times a week) 90 tablet 3   calcitonin, salmon, (MIACALCIN/FORTICAL) 200 UNIT/ACT nasal spray Place 1 spray into alternate nostrils daily. (Patient not taking: Reported on 08/11/2021) 3.7 mL 12   Calcium Carbonate-Vit D-Min (CALCIUM 1200 PO) Take by mouth.     carvedilol (COREG) 25 MG tablet Take 1  tablet (25 mg total) by mouth 2 (two) times daily with a meal. (Patient taking differently: Take 25 mg by mouth daily.) 60 tablet 1   dexamethasone (DECADRON) 4 MG tablet TAKE 5 TABLETS BY MOUTH ONCE A WEEK. 20 tablet 4   docusate sodium (COLACE) 100 MG capsule Take 1 capsule (100 mg total) by mouth 2 (two) times daily. (Patient not taking: Reported on 11/17/2021) 60 capsule 0   ELIQUIS 5 MG TABS tablet TAKE 1 TABLET (5 MG TOTAL) BY MOUTH 2 (TWO) TIMES DAILY. 180 tablet 0   feeding supplement (ENSURE ENLIVE / ENSURE PLUS) LIQD Take 237 mLs by mouth 3 (three) times daily between meals. 237 mL 12   fluticasone (FLONASE) 50 MCG/ACT nasal spray Place into both nostrils daily. (Patient not taking: Reported on 11/17/2021)     gabapentin (NEURONTIN) 300 MG capsule TAKE 2 CAPSULES (600 MG TOTAL) BY MOUTH 3 (THREE) TIMES DAILY. (Patient taking differently: 2 (two) times daily.) 180 capsule 2   lenalidomide (REVLIMID) 15 MG capsule Take 1 capsule (15 mg total) by mouth daily. Take for 21 days, then hold for 7 days. Repeat every 28 days. 21 capsule 0   lidocaine (XYLOCAINE) 5 % ointment Apply topically 3 (three) times daily as needed for mild pain or moderate pain. 35.44 g  0   morphine (MS CONTIN) 15 MG 12 hr tablet Take 1 tablet (15 mg total) by mouth every 12 (twelve) hours. 60 tablet 0   Multiple Vitamin (MULTIVITAMIN WITH MINERALS) TABS tablet Take 1 tablet by mouth daily.     Multiple Vitamins-Minerals (PRESERVISION AREDS PO) Take by mouth.     naloxone (NARCAN) nasal spray 4 mg/0.1 mL SPRAY 1 SPRAY INTO ONE NOSTRIL AS DIRECTED FOR OPIOID OVERDOSE (TURN PERSON ON SIDE AFTER DOSE. IF NO RESPONSE IN 2-3 MINUTES OR PERSON RESPONDS BUT RELAPSES, REPEAT USING A NEW SPRAY DEVICE AND SPRAY INTO THE OTHER NOSTRIL. CALL 911 AFTER USE.) * EMERGENCY USE ONLY * (Patient not taking: Reported on 08/11/2021) 1 each 0   ondansetron (ZOFRAN) 8 MG tablet Take 1 tablet (8 mg total) by mouth every 8 (eight) hours as needed for  nausea or vomiting. (Patient not taking: Reported on 11/17/2021) 60 tablet 2   oxyCODONE (OXY IR/ROXICODONE) 5 MG immediate release tablet Take 1-2 tablets (5-10 mg total) by mouth every 4 (four) hours as needed for severe pain. 90 tablet 0   polyethylene glycol (MIRALAX / GLYCOLAX) 17 g packet Take 17 g by mouth 2 (two) times daily.  0   potassium citrate (UROCIT-K) 10 MEQ (1080 MG) SR tablet Take 1 tablet (10 mEq total) by mouth 3 (three) times daily with meals. 90 tablet 6   senna-docusate (SENOKOT-S) 8.6-50 MG tablet Take 1 tablet by mouth 2 (two) times daily. 30 tablet 0   tamsulosin (FLOMAX) 0.4 MG CAPS capsule TAKE 1 CAPSULE BY MOUTH EVERY DAY 90 capsule 1   valACYclovir (VALTREX) 500 MG tablet Take 500 mg by mouth 2 (two) times daily. (Patient not taking: Reported on 09/24/2021)     No current facility-administered medications for this visit.    VITAL SIGNS: There were no vitals taken for this visit. There were no vitals filed for this visit.   Estimated body mass index is 24.4 kg/m as calculated from the following:   Height as of 09/15/21: '5\' 9"'  (1.753 m).   Weight as of an earlier encounter on 11/17/21: 165 lb 3.2 oz (74.9 kg).  LABS: CBC:    Component Value Date/Time   WBC 5.5 11/17/2021 1415   HGB 12.7 (L) 11/17/2021 1415   HGB 11.0 (L) 02/24/2021 1014   HCT 38.9 (L) 11/17/2021 1415   HCT 35.3 (L) 02/24/2021 1014   PLT 153 11/17/2021 1415   PLT 249 02/24/2021 1014   MCV 96.8 11/17/2021 1415   MCV 96 02/24/2021 1014   MCV 90 01/24/2013 0819   NEUTROABS 3.0 11/17/2021 1415   NEUTROABS 7.4 (H) 01/24/2013 0819   LYMPHSABS 1.5 11/17/2021 1415   LYMPHSABS 1.2 01/24/2013 0819   MONOABS 0.8 11/17/2021 1415   MONOABS 0.3 01/24/2013 0819   EOSABS 0.0 11/17/2021 1415   EOSABS 0.0 01/24/2013 0819   BASOSABS 0.1 11/17/2021 1415   BASOSABS 0.0 01/24/2013 0819   Comprehensive Metabolic Panel:    Component Value Date/Time   NA 139 11/17/2021 1415   NA 136 02/24/2021 1014   NA  138 01/24/2013 0819   K 4.4 11/17/2021 1415   K 3.8 01/24/2013 0819   CL 105 11/17/2021 1415   CL 105 01/24/2013 0819   CO2 28 11/17/2021 1415   CO2 25 01/24/2013 0819   BUN 32 (H) 11/17/2021 1415   BUN 23 02/24/2021 1014   BUN 20 (H) 01/24/2013 0819   CREATININE 1.14 11/17/2021 1415   CREATININE 1.52 (H) 01/24/2013 8295  GLUCOSE 103 (H) 11/17/2021 1415   GLUCOSE 171 (H) 01/24/2013 0819   CALCIUM 9.0 11/17/2021 1415   CALCIUM 10.4 (H) 02/19/2021 1229   AST 23 11/17/2021 1415   AST 18 01/24/2013 0819   ALT 15 11/17/2021 1415   ALT 15 01/24/2013 0819   ALKPHOS 71 11/17/2021 1415   ALKPHOS 74 01/24/2013 0819   BILITOT 0.4 11/17/2021 1415   BILITOT 0.4 02/24/2021 1014   BILITOT 0.5 01/24/2013 0819   PROT 5.8 (L) 11/17/2021 1415   PROT 6.2 02/24/2021 1014   PROT 6.3 (L) 01/24/2013 0819   ALBUMIN 3.6 11/17/2021 1415   ALBUMIN 4.5 02/24/2021 1014   ALBUMIN 3.6 01/24/2013 0819    RADIOGRAPHIC STUDIES: No results found.  PERFORMANCE STATUS (ECOG) : 1 - Symptomatic but completely ambulatory  Review of Systems Unless otherwise noted, a complete review of systems is negative.  Physical Exam General: NAD Pulmonary: Unlabored Extremities: no edema, no joint deformities Skin: no rashes Neurological: Weakness but otherwise nonfocal  IMPRESSION: Routine follow-up.  Patient doing well.  He continues to endorse lumbar back pain despite MS Contin and oxycodone.  Discussed with Dr. Grayland Ormond and will proceed with PET scan for restaging.  Refill MS Contin/oxycodone.  PDMP reviewed.  Patient planning beach trip in April.  PLAN: -Continue current scope of treatment -Continue MS Contin/oxycodone -Daily bowel regimen -RTC 1 month    Patient expressed understanding and was in agreement with this plan. He also understands that He can call the clinic at any time with any questions, concerns, or complaints.     Time Total: 15 minutes  Visit consisted of counseling and  education dealing with the complex and emotionally intense issues of symptom management and palliative care in the setting of serious and potentially life-threatening illness.Greater than 50%  of this time was spent counseling and coordinating care related to the above assessment and plan.  Signed by: Altha Harm, PhD, NP-C

## 2021-11-17 NOTE — Progress Notes (Signed)
[place]  Telephone:([phone] Fax:(336) [phone]  Patient Care Team: [name], MD as PCP - General (Family Medicine) End, [name], MD as PCP - Cardiology (Cardiology) [name], MD (Endocrinology) Lloyd Huger, MD as Consulting Physician (Hematology and Oncology)   Name of the patient: Jack [name]  [id number]  05/30/1933   Date of visit: 11/17/21  HPI: Patient is a 88 y.o. male with newly diagnosed multiple myeloma. Currently treated with Revlimid (lenalidomide) and dexamethasone. Patient was initiated on an all oral regimen because he was unable to physically make it into clinic at the time treatment due to his disease/performance status. His status improved he was able to come back to inperson appts on 07/14/21.  Reason for Consult: Oral chemotherapy follow-up for lenalidomide therapy.   PAST MEDICAL HISTORY: Past Medical History:  Diagnosis Date   Elevated prostate specific antigen (PSA)    has been 7 for a year    GERD (gastroesophageal reflux disease)    History of colon polyps 2008   [facility],    History of kidney stones    Hyperlipidemia    Hypertension    Prostate hypertrophy    diagnosed at age 79 due to hematospermia   Renal disorder     HEMATOLOGY/ONCOLOGY HISTORY:  Oncology History  Kappa light chain myeloma ([place])  03/12/2021 Initial Diagnosis   Kappa light chain myeloma ([place])   03/27/2021 -  Chemotherapy    Patient is on Treatment Plan: MYELOMA NON-TRANSPLANT CANDIDATES VRD SQ Q21D        04/16/2021 Cancer Staging   Staging form: Plasma Cell Myeloma and Plasma Cell Disorders, AJCC 8th Edition - Clinical stage from 04/16/2021: Albumin (g/dL): 3.8, ISS: Stage II, High-risk cytogenetics: Absent, LDH: Unknown - Signed by Lloyd Huger, MD on 04/16/2021 Albumin range (g/dL): Greater than or equal to 3.5 Cytogenetics: t(11;14) translocation Serum calcium level: Normal Serum  creatinine level: Normal Bone disease on imaging: Present      ALLERGIES:  is allergic to quinolones, amlodipine, azithromycin, lisinopril, and morphine and related.  MEDICATIONS:  Current Outpatient Medications  Medication Sig Dispense Refill   atorvastatin (LIPITOR) 40 MG tablet Take 1 tablet (40 mg total) by mouth daily at 6 PM. (Patient taking differently: Take 40 mg by mouth. 2 times a week) 90 tablet 3   calcitonin, salmon, (MIACALCIN/FORTICAL) 200 UNIT/ACT nasal spray Place 1 spray into alternate nostrils daily. (Patient not taking: Reported on 08/11/2021) 3.7 mL 12   Calcium Carbonate-Vit D-Min (CALCIUM 1200 PO) Take by mouth.     carvedilol (COREG) 25 MG tablet Take 1 tablet (25 mg total) by mouth 2 (two) times daily with a meal. (Patient taking differently: Take 25 mg by mouth daily.) 60 tablet 1   dexamethasone (DECADRON) 4 MG tablet TAKE 5 TABLETS BY MOUTH ONCE A WEEK. 20 tablet 4   docusate sodium (COLACE) 100 MG capsule Take 1 capsule (100 mg total) by mouth 2 (two) times daily. (Patient not taking: Reported on 11/17/2021) 60 capsule 0   ELIQUIS 5 MG TABS tablet TAKE 1 TABLET (5 MG TOTAL) BY MOUTH 2 (TWO) TIMES DAILY. 180 tablet 0   feeding supplement (ENSURE ENLIVE / ENSURE PLUS) LIQD Take 237 mLs by mouth 3 (three) times daily between meals. 237 mL 12   fluticasone (FLONASE) 50 MCG/ACT nasal spray Place into both nostrils daily. (Patient not taking: Reported on 11/17/2021)     gabapentin (NEURONTIN) 300 MG capsule TAKE 2 CAPSULES (600 MG TOTAL)  BY MOUTH 3 (THREE) TIMES DAILY. (Patient taking differently: 2 (two) times daily.) 180 capsule 2   lenalidomide (REVLIMID) 15 MG capsule Take 1 capsule (15 mg total) by mouth daily. Take for 21 days, then hold for 7 days. Repeat every 28 days. 21 capsule 0   lidocaine (XYLOCAINE) 5 % ointment Apply topically 3 (three) times daily as needed for mild pain or moderate pain. 35.44 g 0   morphine (MS CONTIN) 15 MG 12 hr tablet Take 1 tablet (15  mg total) by mouth every 12 (twelve) hours. 60 tablet 0   Multiple Vitamin (MULTIVITAMIN WITH MINERALS) TABS tablet Take 1 tablet by mouth daily.     Multiple Vitamins-Minerals (PRESERVISION AREDS PO) Take by mouth.     naloxone (NARCAN) nasal spray 4 mg/0.1 mL SPRAY 1 SPRAY INTO ONE NOSTRIL AS DIRECTED FOR OPIOID OVERDOSE (TURN PERSON ON SIDE AFTER DOSE. IF NO RESPONSE IN 2-3 MINUTES OR PERSON RESPONDS BUT RELAPSES, REPEAT USING A NEW SPRAY DEVICE AND SPRAY INTO THE OTHER NOSTRIL. CALL 911 AFTER USE.) * EMERGENCY USE ONLY * (Patient not taking: Reported on 08/11/2021) 1 each 0   ondansetron (ZOFRAN) 8 MG tablet Take 1 tablet (8 mg total) by mouth every 8 (eight) hours as needed for nausea or vomiting. (Patient not taking: Reported on 11/17/2021) 60 tablet 2   oxyCODONE (OXY IR/ROXICODONE) 5 MG immediate release tablet Take 1-2 tablets (5-10 mg total) by mouth every 4 (four) hours as needed for severe pain. 90 tablet 0   polyethylene glycol (MIRALAX / GLYCOLAX) 17 g packet Take 17 g by mouth 2 (two) times daily.  0   potassium citrate (UROCIT-K) 10 MEQ (1080 MG) SR tablet Take 1 tablet (10 mEq total) by mouth 3 (three) times daily with meals. 90 tablet 6   senna-docusate (SENOKOT-S) 8.6-50 MG tablet Take 1 tablet by mouth 2 (two) times daily. 30 tablet 0   tamsulosin (FLOMAX) 0.4 MG CAPS capsule TAKE 1 CAPSULE BY MOUTH EVERY DAY 90 capsule 1   valACYclovir (VALTREX) 500 MG tablet Take 500 mg by mouth 2 (two) times daily. (Patient not taking: Reported on 09/24/2021)     No current facility-administered medications for this visit.    VITAL SIGNS: There were no vitals taken for this visit. There were no vitals filed for this visit.  Estimated body mass index is 24.4 kg/m as calculated from the following:   Height as of 09/15/21: '5\' 9"'  (1.753 m).   Weight as of an earlier encounter on 11/17/21: 74.9 kg (165 lb 3.2 oz).  LABS: CBC:    Component Value Date/Time   WBC 5.5 11/17/2021 1415   HGB 12.7 (L)  11/17/2021 1415   HGB 11.0 (L) 02/24/2021 1014   HCT 38.9 (L) 11/17/2021 1415   HCT 35.3 (L) 02/24/2021 1014   PLT 153 11/17/2021 1415   PLT 249 02/24/2021 1014   MCV 96.8 11/17/2021 1415   MCV 96 02/24/2021 1014   MCV 90 01/24/2013 0819   NEUTROABS 3.0 11/17/2021 1415   NEUTROABS 7.4 (H) 01/24/2013 0819   LYMPHSABS 1.5 11/17/2021 1415   LYMPHSABS 1.2 01/24/2013 0819   MONOABS 0.8 11/17/2021 1415   MONOABS 0.3 01/24/2013 0819   EOSABS 0.0 11/17/2021 1415   EOSABS 0.0 01/24/2013 0819   BASOSABS 0.1 11/17/2021 1415   BASOSABS 0.0 01/24/2013 0819   Comprehensive Metabolic Panel:    Component Value Date/Time   NA 139 11/17/2021 1415   NA 136 02/24/2021 1014   NA 138 01/24/2013  0819   K 4.4 11/17/2021 1415   K 3.8 01/24/2013 0819   CL 105 11/17/2021 1415   CL 105 01/24/2013 0819   CO2 28 11/17/2021 1415   CO2 25 01/24/2013 0819   BUN 32 (H) 11/17/2021 1415   BUN 23 02/24/2021 1014   BUN 20 (H) 01/24/2013 0819   CREATININE 1.14 11/17/2021 1415   CREATININE 1.52 (H) 01/24/2013 0819   GLUCOSE 103 (H) 11/17/2021 1415   GLUCOSE 171 (H) 01/24/2013 0819   CALCIUM 9.0 11/17/2021 1415   CALCIUM 10.4 (H) 02/19/2021 1229   AST 23 11/17/2021 1415   AST 18 01/24/2013 0819   ALT 15 11/17/2021 1415   ALT 15 01/24/2013 0819   ALKPHOS 71 11/17/2021 1415   ALKPHOS 74 01/24/2013 0819   BILITOT 0.4 11/17/2021 1415   BILITOT 0.4 02/24/2021 1014   BILITOT 0.5 01/24/2013 0819   PROT 5.8 (L) 11/17/2021 1415   PROT 6.2 02/24/2021 1014   PROT 6.3 (L) 01/24/2013 0819   ALBUMIN 3.6 11/17/2021 1415   ALBUMIN 4.5 02/24/2021 1014   ALBUMIN 3.6 01/24/2013 0819     Present during today's visit: Patient and [name]  Assessment and Plan: CBC/BMP reviewed with patient and wife, wbc, neutrophils, and platelets have increased to wnl Because patient has lenalidomide 90m (reduced dose) in hand, plan to proceed with the 134mdose Patient will return to clinic in 3 weeks for repeat labs.  If labs remain wnl we will dose increase back to 2027mith the cycle starting the week after his labs appt   Oral Chemotherapy Side Effect/Intolerance:  No reported N/V, diarrhea, constipation, or fatigue.  Patient is reporting an increase in back pain which has caused him to walk less. MD plans to repeat imaging prior to next office visit.    Oral Chemotherapy Adherence: no reported missed doses No patient barriers to medication adherence identified.   New medications: none reported  Medication Access Issues: no issues, fills Revlimid at [facility]  Patient expressed understanding and was in agreement with this plan. He also understands that He can call clinic at any time with any questions, concerns, or complaints.   Follow-up plan: RTC in 3 week for labs only and 4 weeks for lab/MD/pharm/inj  Thank you for allowing me to participate in the care of this very pleasant patient.   Time Total: 10 mins  Visit consisted of counseling and education on dealing with issues of symptom management in the setting of serious and potentially life-threatening illness.Greater than 50%  of this time was spent counseling and coordinating care related to the above assessment and plan.  Signed by: AlyDarl PikesharmD, BCPS, BCOSalley SlaughterPP Hematology/Oncology Clinical Pharmacist Practitioner [place]/DB/AP [phone]/03/2022 3:57 PM

## 2021-11-17 NOTE — Progress Notes (Signed)
Pt c/o continued lower back pain especially over hips. Pt bp has been elevated at home. Pt requesting morphine refill and asking for cholesterol to be checked as fasting labs at next visit.  ?

## 2021-11-18 ENCOUNTER — Other Ambulatory Visit: Payer: Self-pay | Admitting: Urology

## 2021-11-20 DIAGNOSIS — H353231 Exudative age-related macular degeneration, bilateral, with active choroidal neovascularization: Secondary | ICD-10-CM | POA: Diagnosis not present

## 2021-11-21 ENCOUNTER — Encounter: Payer: Self-pay | Admitting: Oncology

## 2021-11-21 ENCOUNTER — Encounter: Payer: Self-pay | Admitting: Urology

## 2021-11-23 ENCOUNTER — Other Ambulatory Visit: Payer: Self-pay | Admitting: *Deleted

## 2021-11-23 DIAGNOSIS — C9000 Multiple myeloma not having achieved remission: Secondary | ICD-10-CM | POA: Diagnosis not present

## 2021-11-23 DIAGNOSIS — Z515 Encounter for palliative care: Secondary | ICD-10-CM | POA: Diagnosis not present

## 2021-11-23 DIAGNOSIS — D692 Other nonthrombocytopenic purpura: Secondary | ICD-10-CM | POA: Diagnosis not present

## 2021-11-23 DIAGNOSIS — G893 Neoplasm related pain (acute) (chronic): Secondary | ICD-10-CM | POA: Diagnosis not present

## 2021-11-23 DIAGNOSIS — Z87311 Personal history of (healed) other pathological fracture: Secondary | ICD-10-CM | POA: Diagnosis not present

## 2021-11-23 DIAGNOSIS — I10 Essential (primary) hypertension: Secondary | ICD-10-CM | POA: Diagnosis not present

## 2021-11-23 DIAGNOSIS — I739 Peripheral vascular disease, unspecified: Secondary | ICD-10-CM | POA: Diagnosis not present

## 2021-11-23 MED ORDER — TAMSULOSIN HCL 0.4 MG PO CAPS: 0.4000 mg | ORAL_CAPSULE | Freq: Every day | ORAL | 3 refills | Status: DC

## 2021-12-01 ENCOUNTER — Other Ambulatory Visit: Payer: PPO | Admitting: Nurse Practitioner

## 2021-12-01 ENCOUNTER — Other Ambulatory Visit: Payer: Self-pay

## 2021-12-01 ENCOUNTER — Encounter: Payer: Self-pay | Admitting: Nurse Practitioner

## 2021-12-01 DIAGNOSIS — R531 Weakness: Secondary | ICD-10-CM

## 2021-12-01 DIAGNOSIS — Z515 Encounter for palliative care: Secondary | ICD-10-CM

## 2021-12-01 DIAGNOSIS — C9000 Multiple myeloma not having achieved remission: Secondary | ICD-10-CM

## 2021-12-01 NOTE — Progress Notes (Signed)
? ? ?Manufacturing [facility] ?[facility] Consult Note ?Telephone: [phone]  ?Fax: [phone]  ? ? ?Date of encounter: 12/01/21 ?3:02 PM ?PATIENT NAME: Jack [name] ?[place] [address]   ?[phone] (home)  ?DOB: [birth year] ?MRN: [id number] ?PRIMARY CARE PROVIDER:    ?[name], MD,  ?[address] ?[place] [address] ?[phone] ? ?RESPONSIBLE PARTY:    ?Contact Information   ? ? Name Relation Home Work Mobile  ? [place], [place] Spouse [phone]  [phone]  ? ?  ? ?Due to the COVID-19 crisis, this visit was done via telemedicine from my office and it was initiated and consent by this patient and or family. ? ?I connected with  Jack [name] OR PROXY on 12/01/21 by a telephone as video not available enabled telemedicine application and verified that I am speaking with the correct person using two identifiers. ?  ?I discussed the limitations of evaluation and management by telemedicine. The patient expressed understanding and agreed to proceed.  Palliative Care was asked to follow this patient by consultation request of  [name], MD to address advance care planning and complex medical decision making. This is a follow up visit.                                  ?ASSESSMENT AND PLAN / RECOMMENDATIONS:  ?Symptom Management/Plan: ?1. Advance Care Planning; DNR; Currently taking oral chemotherapy, doing well. Wishes to continue  ?  ?2. Palliative care encounter; Palliative care encounter; Palliative medicine team will continue to support patient, patient's family, and medical team. Visit consisted of counseling and education dealing with the complex and emotionally intense issues of symptom management and palliative care in the setting of serious and potentially life-threatening illness ?  ?3.generalized weakness secondary to multiple myeloma and chronic pain. Discussed with [name] he continues to improve, ambulating, no recent falls. [name]  endorses he has been ambulating outside, doing well. Doing his ADL's. ? ?11/17/2021 weight 165.3 lbs ?  ?Follow up Palliative Care Visit: Palliative care will continue to follow for complex medical decision making, advance care planning, and clarification of goals. Return 8 weeks or prn. ? ?I spent 21 minutes providing this consultation. More than 50% of the time in this consultation was spent in counseling and care coordination. ?PPS: 50% ? ?Chief Complaint: Follow up palliative consult for complex medical decision making ? ?HISTORY OF PRESENT ILLNESS:  Jack [name] is a 88 y.o. year old male  with multiple medical problems including hypertension, CKD stage IIIb, A. fib on Eliquis, and anemia.  Hospitalized 01/02/2021 to 01/07/2021 with T12 compression fracture with recommendation for conservative management including use of a TLSO brace. Hospitalized 01/29/2021 to 02/07/2021 with recurrent back pain and MR showing T12 and new L1 compression fracture. Underwent T12/L1 kyphoplasty on 02/02/2021. Suspicion of a left fifth rib fracture, which occurred while transitioning from the stretcher for his kyphoplasty.  Mr. Sibal had mild hypercalcemia with SPEP and UPEP concerning for possible myeloma.  Referred to [facility] for work-up with readmission 03/11/2021 - 03/17/2021 with worsening back pain, CT revealed new compression fractures of L2 and L3, underwent kyphoplasty on 6/30. Hospitalized again 03/26/2021-04/07/2021 with severe back/flank pain, MRI revealed T10 pathologic compression fracture, started on treatment for multiple myeloma. Last Oncology visit 11/17/2021 with Dr Grayland Ormond receive Delton See with weekly dexamethasone, plan to return to clinic for labs, PET scan then 4  weeks for further evaluation. Back pain with current narcotic with gabapentin. Right flank mass: MRI results from July 13, 2021 reviewed independently with no obvious pathology on his right flank, but did reveal several fractured ribs. I called Mr  and Mrs Travelstead for t/u PC visit telemedicine telephonic as video not available. Mr and Mrs Frediani in agreement. I talked about [name] about ros, symptoms, improvement of strength, ambulating, functional abilities. We talked about last Oncology visit, current treatment with plan. Medical goals reviewed. Talked about f/u PC, scheduled visit. Therapeutic listening, emotional support provided. Questions answered.  ? ? Cancer Staging  ?Kappa light chain myeloma (HCC) ?Staging form: Plasma Cell Myeloma and Plasma Cell Disorders, AJCC 8th Edition ?- Clinical stage from 04/16/2021: Albumin (g/dL): 3.8, ISS: Stage II, High-risk cytogenetics: Absent, LDH: Unknown - Signed by Lloyd Huger, MD on 04/16/2021 ?Albumin range (g/dL): Greater than or equal to 3.5 ?Cytogenetics: R(60;45) translocation ?Serum calcium level: Normal ?Serum creatinine level: Normal ?Bone disease on imaging: Present ? ?History obtained from review of EMR, discussion with  Mr. Cromartie.  ?I reviewed available labs, medications, imaging, studies and related documents from the EMR.  Records reviewed and summarized above.  ? ?ROS ?10 point system reviewed negative except HPI ? ?Physical Exam: ?deferred ?Thank you for the opportunity to participate in the care of Mr. Achord.  The palliative care team will continue to follow. Please call our office at [phone] if we can be of additional assistance.  ? ?Nikola Blackston Z Aria Jarrard, NP  ? ?

## 2021-12-06 ENCOUNTER — Encounter: Payer: Self-pay | Admitting: Oncology

## 2021-12-08 ENCOUNTER — Ambulatory Visit (HOSPITAL_COMMUNITY)
Admission: RE | Admit: 2021-12-08 | Discharge: 2021-12-08 | Disposition: A | Discharge status: Home / Self Care | Payer: PPO | Source: Ambulatory Visit | Attending: Hospice and Palliative Medicine | Admitting: Hospice and Palliative Medicine

## 2021-12-08 ENCOUNTER — Other Ambulatory Visit: Payer: Self-pay

## 2021-12-08 ENCOUNTER — Other Ambulatory Visit: Payer: Self-pay | Admitting: *Deleted

## 2021-12-08 ENCOUNTER — Encounter: Payer: Self-pay | Admitting: Oncology

## 2021-12-08 ENCOUNTER — Inpatient Hospital Stay: Payer: PPO

## 2021-12-08 DIAGNOSIS — S22000A Wedge compression fracture of unspecified thoracic vertebra, initial encounter for closed fracture: Secondary | ICD-10-CM | POA: Diagnosis not present

## 2021-12-08 DIAGNOSIS — C9000 Multiple myeloma not having achieved remission: Secondary | ICD-10-CM | POA: Diagnosis not present

## 2021-12-08 DIAGNOSIS — M8440XA Pathological fracture, unspecified site, initial encounter for fracture: Secondary | ICD-10-CM | POA: Diagnosis not present

## 2021-12-08 DIAGNOSIS — J9601 Acute respiratory failure with hypoxia: Secondary | ICD-10-CM | POA: Diagnosis not present

## 2021-12-08 LAB — LIPID PANEL
Cholesterol: 130 mg/dL (ref 0–200)
HDL: 44 mg/dL (ref >40)
LDL Cholesterol: 51 mg/dL (ref 0–99)
Total CHOL/HDL Ratio: 3 RATIO
Triglycerides: 176 mg/dL — ABNORMAL HIGH (ref <150)
VLDL: 35 mg/dL (ref 0–40)

## 2021-12-08 LAB — GLUCOSE, CAPILLARY: Glucose-Capillary: 129 mg/dL — ABNORMAL HIGH (ref 70–99)

## 2021-12-08 MED ORDER — LENALIDOMIDE 15 MG PO CAPS: 15.0000 mg | ORAL_CAPSULE | Freq: Every day | ORAL | 0 refills | Status: DC

## 2021-12-08 MED ORDER — FLUDEOXYGLUCOSE F - 18 (FDG) INJECTION
7.0000 | Freq: Once | INTRAVENOUS | Status: AC | PRN
  Administered 2021-12-08: 8.22 via INTRAVENOUS

## 2021-12-09 ENCOUNTER — Encounter: Payer: Self-pay | Admitting: Oncology

## 2021-12-09 ENCOUNTER — Other Ambulatory Visit: Payer: Self-pay | Admitting: Pharmacist

## 2021-12-09 ENCOUNTER — Inpatient Hospital Stay: Payer: PPO

## 2021-12-09 DIAGNOSIS — C9000 Multiple myeloma not having achieved remission: Secondary | ICD-10-CM

## 2021-12-09 LAB — COMPREHENSIVE METABOLIC PANEL
ALT: 19 U/L (ref 0–44)
AST: 28 U/L (ref 15–41)
Albumin: 3.7 g/dL (ref 3.5–5.0)
Alkaline Phosphatase: 70 U/L (ref 38–126)
Anion gap: 6 (ref 5–15)
BUN: 27 mg/dL — ABNORMAL HIGH (ref 8–23)
CO2: 24 mmol/L (ref 22–32)
Calcium: 8.5 mg/dL — ABNORMAL LOW (ref 8.9–10.3)
Chloride: 106 mmol/L (ref 98–111)
Creatinine, Ser: 1.33 mg/dL — ABNORMAL HIGH (ref 0.61–1.24)
GFR, Estimated: 52 mL/min — ABNORMAL LOW (ref >60)
Glucose, Bld: 157 mg/dL — ABNORMAL HIGH (ref 70–99)
Potassium: 4.1 mmol/L (ref 3.5–5.1)
Sodium: 136 mmol/L (ref 135–145)
Total Bilirubin: 0.3 mg/dL (ref 0.3–1.2)
Total Protein: 6.3 g/dL — ABNORMAL LOW (ref 6.5–8.1)

## 2021-12-09 LAB — CBC WITH DIFFERENTIAL/PLATELET
Abs Immature Granulocytes: 0.04 10*3/uL (ref 0.00–0.07)
Basophils Absolute: 0 10*3/uL (ref 0.0–0.1)
Basophils Relative: 0 %
Eosinophils Absolute: 0 10*3/uL (ref 0.0–0.5)
Eosinophils Relative: 1 %
HCT: 42 % (ref 39.0–52.0)
Hemoglobin: 13.5 g/dL (ref 13.0–17.0)
Immature Granulocytes: 1 %
Lymphocytes Relative: 21 %
Lymphs Abs: 0.8 10*3/uL (ref 0.7–4.0)
MCH: 31 pg (ref 26.0–34.0)
MCHC: 32.1 g/dL (ref 30.0–36.0)
MCV: 96.6 fL (ref 80.0–100.0)
Monocytes Absolute: 0.1 10*3/uL (ref 0.1–1.0)
Monocytes Relative: 2 %
Neutro Abs: 2.6 10*3/uL (ref 1.7–7.7)
Neutrophils Relative %: 75 %
Platelets: 109 10*3/uL — ABNORMAL LOW (ref 150–400)
RBC: 4.35 MIL/uL (ref 4.22–5.81)
RDW: 16.2 % — ABNORMAL HIGH (ref 11.5–15.5)
WBC: 3.5 10*3/uL — ABNORMAL LOW (ref 4.0–10.5)
nRBC: 0 % (ref 0.0–0.2)

## 2021-12-09 MED ORDER — LENALIDOMIDE 20 MG PO CAPS: 20.0000 mg | ORAL_CAPSULE | Freq: Every day | ORAL | 0 refills | Status: DC

## 2021-12-09 NOTE — Telephone Encounter (Signed)
Thanks confirmed, appointment, patient is on the way for labs

## 2021-12-09 NOTE — Telephone Encounter (Signed)
Ok thank you for the update. I will get it scheduled and see if they can come today or tomorrow( I have labs cmp, cbc w plat, immuno and kappa chains) hopefully that's all the labs needed

## 2021-12-10 LAB — KAPPA/LAMBDA LIGHT CHAINS
Kappa free light chain: 52.3 mg/L — ABNORMAL HIGH (ref 3.3–19.4)
Kappa, lambda light chain ratio: 5.75 — ABNORMAL HIGH (ref 0.26–1.65)
Lambda free light chains: 9.1 mg/L (ref 5.7–26.3)

## 2021-12-11 LAB — IMMUNOGLOBULINS A/E/G/M, SERUM
IgA: 97 mg/dL (ref 61–437)
IgE (Immunoglobulin E), Serum: <2 IU/mL — ABNORMAL LOW (ref 6–495)
IgG (Immunoglobin G), Serum: 423 mg/dL — ABNORMAL LOW (ref 603–1613)
IgM (Immunoglobulin M), Srm: 25 mg/dL (ref 15–143)

## 2021-12-13 NOTE — Progress Notes (Signed)
?[facility]  ?[phone] [phone] ? ?ID: Jack [name] OB: [birth year]  MR#: [id number]  OHY#:[id number] ? ?Patient Care Team: ?[name], MD as PCP - General (Family Medicine) ?Nelva Bush, MD as PCP - Cardiology (Cardiology) ?[name], MD (Endocrinology) ?Lloyd Huger, MD as Consulting Physician (Hematology and Oncology) ? ? ?CHIEF COMPLAINT: Stage II kappa chain myeloma. ? ?INTERVAL HISTORY: Patient returns to clinic today for repeat laboratory work, further evaluation, discussion of his imaging results, and continuation of Revlimid and Xgeva.  He continues to have chronic back pain, but otherwise feels well.  He continues to be more active and he and his wife are planning a beach trip later this month.  He has a good appetite and his weight is remained stable.  He continues to tolerate Revlimid well without significant side effects.  He has no neurologic complaints.  He denies any recent fevers or illnesses.  He has no chest pain, shortness of breath, cough, or hemoptysis.  He denies any nausea, vomiting, constipation, or diarrhea.  He has no urinary complaints.  Patient offers no further specific complaints today. ? ?REVIEW OF SYSTEMS:   ?Review of Systems  ?Constitutional:  Positive for malaise/fatigue. Negative for fever.  ?Respiratory: Negative.  Negative for cough, hemoptysis and shortness of breath.   ?Cardiovascular: Negative.  Negative for chest pain and leg swelling.  ?Gastrointestinal: Negative.  Negative for abdominal pain.  ?Genitourinary: Negative.  Negative for dysuria and flank pain.  ?Musculoskeletal:  Positive for back pain. Negative for joint pain.  ?Skin: Negative.  Negative for rash.  ?Neurological:  Positive for weakness. Negative for dizziness, focal weakness and headaches.  ?Psychiatric/Behavioral: Negative.  The patient is not nervous/anxious.   ? ?As per HPI. Otherwise, a complete review of systems is  negative. ? ?PAST MEDICAL HISTORY: ?Past Medical History:  ?Diagnosis Date  ? Elevated prostate specific antigen (PSA)   ? has been 7 for a year   ? GERD (gastroesophageal reflux disease)   ? History of colon polyps 2008  ? [facility],   ? History of kidney stones   ? Hyperlipidemia   ? Hypertension   ? Prostate hypertrophy   ? diagnosed at age 25 due to hematospermia  ? Renal disorder   ? ? ?PAST SURGICAL HISTORY: ?Past Surgical History:  ?Procedure Laterality Date  ? CATARACT EXTRACTION W/PHACO Left 01/10/2018  ? Procedure: CATARACT EXTRACTION PHACO AND INTRAOCULAR LENS PLACEMENT (IOC);  Surgeon: Birder Robson, MD;  Location: [facility] ORS;  Service: Ophthalmology;  Laterality: Left;  [name] 00:24.8 ?AP% 14.9 ?CDE 3.68 ?Fluid pack lot # [id number]  ? CATARACT EXTRACTION W/PHACO Right 01/25/2018  ? Procedure: CATARACT EXTRACTION PHACO AND INTRAOCULAR LENS PLACEMENT (IOC);  Surgeon: Birder Robson, MD;  Location: [facility] ORS;  Service: Ophthalmology;  Laterality: Right;  [name] 00:42 ?AP% 10.8 ?CDE 4.59 ?Fluid pack lot # [id number]  ? COLON SURGERY    ? CYSTOSCOPY W/ URETERAL STENT PLACEMENT Right 10/16/2017  ? Procedure: right  URETERAL STENT PLACEMENT,cystoscopy bilateral stent removal,rretrograde;  Surgeon: Abbie Sons, MD;  Location: [facility] ORS;  Service: Urology;  Laterality: Right;  ? CYSTOSCOPY/URETEROSCOPY/HOLMIUM LASER/STENT PLACEMENT Right 12/16/2020  ? Procedure: CYSTOSCOPY/URETEROSCOPY/HOLMIUM LASER/STENT PLACEMENT;  Surgeon: Abbie Sons, MD;  Location: [facility] ORS;  Service: Urology;  Laterality: Right;  ? EXTRACORPOREAL SHOCK WAVE LITHOTRIPSY Right 12/11/2020  ? Procedure: EXTRACORPOREAL SHOCK WAVE LITHOTRIPSY (ESWL);  Surgeon: Abbie Sons, MD;  Location: [facility] ORS;  Service: Urology;  Laterality: Right;  ? IR KYPHO  EA ADDL LEVEL THORACIC OR LUMBAR  02/02/2021  ? IR KYPHO LUMBAR INC FX REDUCE BONE BX UNI/BIL CANNULATION INC/IMAGING  02/02/2021  ? KIDNEY STONE SURGERY    ? KYPHOPLASTY N/A 03/12/2021  ?  Procedure: KYPHOPLASTY, L2, L3;  Surgeon: Hessie Knows, MD;  Location: [facility] ORS;  Service: Orthopedics;  Laterality: N/A;  ? RESECTION SOFT TISSUE TUMOR LEG / ANKLE RADICAL  jan 2009  ? [facility],  right thigh/knee , nonmalignant  ? SMALL INTESTINE SURGERY  1946  ? implaed on picket fence, punctured stomach  ? TONSILLECTOMY    ? ? ?FAMILY HISTORY: ?Family History  ?Problem Relation Age of Onset  ? Hypertension Father   ? Hyperlipidemia Father   ? Cancer Sister   ?     thyroid - dx in late 20's  ? ? ?ADVANCED DIRECTIVES (Y/N):  N ? ?HEALTH MAINTENANCE: ?Social History  ? ?Tobacco Use  ? Smoking status: Former  ?  Types: Cigarettes  ?  Quit date: 07/05/1965  ?  Years since quitting: 25.4  ? Smokeless tobacco: Never  ?Vaping Use  ? Vaping Use: Never used  ?Substance Use Topics  ? Alcohol use: Yes  ?  Alcohol/week: 1.0 standard drink  ?  Types: 1 Standard drinks or equivalent per week  ?  Comment: occassionaly  ? Drug use: No  ? ? ? Colonoscopy: ? PAP: ? Bone density: ? Lipid panel: ? ?Allergies  ?Allergen Reactions  ? Quinolones   ?  Aortic dilation contraindicates FQ  ? Amlodipine Other (See Comments)  ?  LE edema  ? Azithromycin Nausea And Vomiting  ? Lisinopril Cough  ? Morphine And Related Nausea And Vomiting  ? ? ?Current Outpatient Medications  ?Medication Sig Dispense Refill  ? atorvastatin (LIPITOR) 40 MG tablet Take 1 tablet (40 mg total) by mouth daily at 6 PM. (Patient taking differently: Take 40 mg by mouth. 2 times a week) 90 tablet 3  ? Calcium Carbonate-Vit D-Min (CALCIUM 1200 PO) Take by mouth.    ? carvedilol (COREG) 25 MG tablet Take 1 tablet (25 mg total) by mouth 2 (two) times daily with a meal. (Patient taking differently: Take 25 mg by mouth daily.) 60 tablet 1  ? dexamethasone (DECADRON) 4 MG tablet TAKE 5 TABLETS BY MOUTH ONCE A WEEK. 20 tablet 4  ? ELIQUIS 5 MG TABS tablet TAKE 1 TABLET (5 MG TOTAL) BY MOUTH 2 (TWO) TIMES DAILY. 180 tablet 0  ? feeding supplement (ENSURE ENLIVE / ENSURE PLUS) LIQD  Take 237 mLs by mouth 3 (three) times daily between meals. 237 mL 12  ? fluticasone (FLONASE) 50 MCG/ACT nasal spray Place into both nostrils daily.    ? gabapentin (NEURONTIN) 300 MG capsule TAKE 2 CAPSULES (600 MG TOTAL) BY MOUTH 3 (THREE) TIMES DAILY. (Patient taking differently: 2 (two) times daily.) 180 capsule 2  ? lenalidomide (REVLIMID) 20 MG capsule Take 1 capsule (20 mg total) by mouth daily. Take for 21 days, then hold for 7 days. Repeat every 28 days 21 capsule 0  ? lidocaine (XYLOCAINE) 5 % ointment Apply topically 3 (three) times daily as needed for mild pain or moderate pain. 35.44 g 0  ? morphine (MS CONTIN) 15 MG 12 hr tablet Take 1 tablet (15 mg total) by mouth every 12 (twelve) hours. 60 tablet 0  ? Multiple Vitamin (MULTIVITAMIN WITH MINERALS) TABS tablet Take 1 tablet by mouth daily.    ? Multiple Vitamins-Minerals (PRESERVISION AREDS PO) Take by mouth.    ?  ondansetron (ZOFRAN) 8 MG tablet Take 1 tablet (8 mg total) by mouth every 8 (eight) hours as needed for nausea or vomiting. 60 tablet 2  ? oxyCODONE (OXY IR/ROXICODONE) 5 MG immediate release tablet Take 1-2 tablets (5-10 mg total) by mouth every 4 (four) hours as needed for severe pain. 90 tablet 0  ? polyethylene glycol (MIRALAX / GLYCOLAX) 17 g packet Take 17 g by mouth 2 (two) times daily.  0  ? potassium citrate (UROCIT-K) 10 MEQ (1080 MG) SR tablet TAKE 1 TABLET (10 MEQ TOTAL) BY MOUTH 3 (THREE) TIMES DAILY WITH MEALS. 270 tablet 2  ? senna-docusate (SENOKOT-S) 8.6-50 MG tablet Take 1 tablet by mouth 2 (two) times daily. 30 tablet 0  ? tamsulosin (FLOMAX) 0.4 MG CAPS capsule TAKE 1 CAPSULE BY MOUTH EVERY DAY 90 capsule 1  ? tamsulosin (FLOMAX) 0.4 MG CAPS capsule Take 1 capsule (0.4 mg total) by mouth daily. 90 capsule 3  ? valACYclovir (VALTREX) 500 MG tablet Take 500 mg by mouth 2 (two) times daily.    ? calcitonin, salmon, (MIACALCIN/FORTICAL) 200 UNIT/ACT nasal spray Place 1 spray into alternate nostrils daily. (Patient not  taking: Reported on 08/11/2021) 3.7 mL 12  ? docusate sodium (COLACE) 100 MG capsule Take 1 capsule (100 mg total) by mouth 2 (two) times daily. (Patient not taking: Reported on 11/17/2021) 60 capsule 0  ? lenalidomide (REVLIMID) 1

## 2021-12-14 ENCOUNTER — Other Ambulatory Visit: Payer: Self-pay | Admitting: Emergency Medicine

## 2021-12-14 ENCOUNTER — Encounter: Payer: Self-pay | Admitting: Oncology

## 2021-12-14 DIAGNOSIS — E785 Hyperlipidemia, unspecified: Secondary | ICD-10-CM

## 2021-12-14 NOTE — Progress Notes (Signed)
Lipid panel added per PCP. ?

## 2021-12-14 NOTE — Progress Notes (Signed)
Pt has questions about carvedilol. Pt also questions about PET scan results.  ?

## 2021-12-15 ENCOUNTER — Other Ambulatory Visit: Payer: Self-pay | Admitting: Emergency Medicine

## 2021-12-15 ENCOUNTER — Inpatient Hospital Stay (HOSPITAL_BASED_OUTPATIENT_CLINIC_OR_DEPARTMENT_OTHER): Payer: PPO | Admitting: Hospice and Palliative Medicine

## 2021-12-15 ENCOUNTER — Inpatient Hospital Stay: Payer: PPO | Attending: Oncology

## 2021-12-15 ENCOUNTER — Inpatient Hospital Stay: Payer: PPO | Admitting: Pharmacist

## 2021-12-15 ENCOUNTER — Inpatient Hospital Stay: Payer: PPO | Admitting: Oncology

## 2021-12-15 ENCOUNTER — Inpatient Hospital Stay: Payer: PPO

## 2021-12-15 ENCOUNTER — Other Ambulatory Visit: Payer: Self-pay

## 2021-12-15 VITALS — BP 145/76 | HR 45 | Temp 98.3°F | Resp 16 | Ht 69.0 in | Wt 164.9 lb

## 2021-12-15 DIAGNOSIS — Z515 Encounter for palliative care: Secondary | ICD-10-CM

## 2021-12-15 DIAGNOSIS — C9000 Multiple myeloma not having achieved remission: Secondary | ICD-10-CM

## 2021-12-15 DIAGNOSIS — R5383 Other fatigue: Secondary | ICD-10-CM | POA: Diagnosis not present

## 2021-12-15 DIAGNOSIS — Z79899 Other long term (current) drug therapy: Secondary | ICD-10-CM | POA: Diagnosis not present

## 2021-12-15 DIAGNOSIS — D709 Neutropenia, unspecified: Secondary | ICD-10-CM | POA: Insufficient documentation

## 2021-12-15 DIAGNOSIS — E785 Hyperlipidemia, unspecified: Secondary | ICD-10-CM

## 2021-12-15 LAB — COMPREHENSIVE METABOLIC PANEL
ALT: 15 U/L (ref 0–44)
AST: 21 U/L (ref 15–41)
Albumin: 3.6 g/dL (ref 3.5–5.0)
Alkaline Phosphatase: 61 U/L (ref 38–126)
Anion gap: 5 (ref 5–15)
BUN: 27 mg/dL — ABNORMAL HIGH (ref 8–23)
CO2: 27 mmol/L (ref 22–32)
Calcium: 8.7 mg/dL — ABNORMAL LOW (ref 8.9–10.3)
Chloride: 104 mmol/L (ref 98–111)
Creatinine, Ser: 1.12 mg/dL (ref 0.61–1.24)
GFR, Estimated: >60 mL/min (ref >60)
Glucose, Bld: 101 mg/dL — ABNORMAL HIGH (ref 70–99)
Potassium: 4.2 mmol/L (ref 3.5–5.1)
Sodium: 136 mmol/L (ref 135–145)
Total Bilirubin: 0.5 mg/dL (ref 0.3–1.2)
Total Protein: 5.9 g/dL — ABNORMAL LOW (ref 6.5–8.1)

## 2021-12-15 LAB — CBC WITH DIFFERENTIAL/PLATELET
Abs Immature Granulocytes: 0.02 10*3/uL (ref 0.00–0.07)
Basophils Absolute: 0.1 10*3/uL (ref 0.0–0.1)
Basophils Relative: 1 %
Eosinophils Absolute: 0.1 10*3/uL (ref 0.0–0.5)
Eosinophils Relative: 3 %
HCT: 41.6 % (ref 39.0–52.0)
Hemoglobin: 13.3 g/dL (ref 13.0–17.0)
Immature Granulocytes: 1 %
Lymphocytes Relative: 25 %
Lymphs Abs: 1 10*3/uL (ref 0.7–4.0)
MCH: 31.2 pg (ref 26.0–34.0)
MCHC: 32 g/dL (ref 30.0–36.0)
MCV: 97.7 fL (ref 80.0–100.0)
Monocytes Absolute: 0.5 10*3/uL (ref 0.1–1.0)
Monocytes Relative: 11 %
Neutro Abs: 2.4 10*3/uL (ref 1.7–7.7)
Neutrophils Relative %: 59 %
Platelets: 102 10*3/uL — ABNORMAL LOW (ref 150–400)
RBC: 4.26 MIL/uL (ref 4.22–5.81)
RDW: 16.2 % — ABNORMAL HIGH (ref 11.5–15.5)
WBC: 4.1 10*3/uL (ref 4.0–10.5)
nRBC: 0 % (ref 0.0–0.2)

## 2021-12-15 LAB — LIPID PANEL
Cholesterol: 133 mg/dL (ref 0–200)
HDL: 41 mg/dL (ref >40)
LDL Cholesterol: 53 mg/dL (ref 0–99)
Total CHOL/HDL Ratio: 3.2 RATIO
Triglycerides: 194 mg/dL — ABNORMAL HIGH (ref <150)
VLDL: 39 mg/dL (ref 0–40)

## 2021-12-15 MED ORDER — MORPHINE SULFATE ER 15 MG PO TBCR: 15.0000 mg | EXTENDED_RELEASE_TABLET | Freq: Two times a day (BID) | ORAL | 0 refills | Status: DC

## 2021-12-15 MED ORDER — DENOSUMAB 120 MG/1.7ML ~~LOC~~ SOLN
120.0000 mg | Freq: Once | SUBCUTANEOUS | Status: AC
  Administered 2021-12-15: 120 mg via SUBCUTANEOUS
  Filled 2021-12-15: qty 1.7

## 2021-12-15 NOTE — Progress Notes (Signed)
? ?[facility] ?[place]  ?[phone] [phone] ? ?Patient Care Team: ?[name], MD as PCP - General (Family Medicine) ?Nelva Bush, MD as PCP - Cardiology (Cardiology) ?[name], MD (Endocrinology) ?Lloyd Huger, MD as Consulting Physician (Hematology and Oncology)  ? ?Name of the patient: Jack [name]  ?[id number]  ?03-Feb-1933  ? ?Date of visit: 12/15/21 ? ?HPI: Patient is a 88 y.o. male with newly diagnosed multiple myeloma. Currently treated with Revlimid (lenalidomide) and dexamethasone. Patient was initiated on an all oral regimen because he was unable to physically make it into clinic at the time treatment due to his disease/performance status. His status improved he was able to come back to inperson appts on 07/14/21. ? ?Reason for Consult: Oral chemotherapy follow-up for lenalidomide therapy. ? ? ?PAST MEDICAL HISTORY: ?Past Medical History:  ?Diagnosis Date  ? Elevated prostate specific antigen (PSA)   ? has been 7 for a year   ? GERD (gastroesophageal reflux disease)   ? History of colon polyps 2008  ? [facility],   ? History of kidney stones   ? Hyperlipidemia   ? Hypertension   ? Prostate hypertrophy   ? diagnosed at age 42 due to hematospermia  ? Renal disorder   ? ? ?HEMATOLOGY/ONCOLOGY HISTORY:  ?Oncology History  ?Kappa light chain myeloma ([place])  ?03/12/2021 Initial Diagnosis  ? Kappa light chain myeloma (HCC) ?  ?03/27/2021 -  Chemotherapy  ?  Patient is on Treatment Plan: MYELOMA NON-TRANSPLANT CANDIDATES VRD SQ Q21D  ? ?  ? ?  ?04/16/2021 Cancer Staging  ? Staging form: Plasma Cell Myeloma and Plasma Cell Disorders, AJCC 8th Edition ?- Clinical stage from 04/16/2021: Albumin (g/dL): 3.8, ISS: Stage II, High-risk cytogenetics: Absent, LDH: Unknown - Signed by Lloyd Huger, MD on 04/16/2021 ?Albumin range (g/dL): Greater than or equal to 3.5 ?Cytogenetics: D(97;41) translocation ?Serum calcium level: Normal ?Serum  creatinine level: Normal ?Bone disease on imaging: Present ? ?  ? ? ?ALLERGIES:  is allergic to quinolones, amlodipine, azithromycin, lisinopril, and morphine and related. ? ?MEDICATIONS:  ?Current Outpatient Medications  ?Medication Sig Dispense Refill  ? atorvastatin (LIPITOR) 40 MG tablet Take 1 tablet (40 mg total) by mouth daily at 6 PM. (Patient taking differently: Take 40 mg by mouth. 2 times a week) 90 tablet 3  ? calcitonin, salmon, (MIACALCIN/FORTICAL) 200 UNIT/ACT nasal spray Place 1 spray into alternate nostrils daily. (Patient not taking: Reported on 08/11/2021) 3.7 mL 12  ? Calcium Carbonate-Vit D-Min (CALCIUM 1200 PO) Take by mouth.    ? carvedilol (COREG) 25 MG tablet Take 1 tablet (25 mg total) by mouth 2 (two) times daily with a meal. (Patient taking differently: Take 25 mg by mouth daily.) 60 tablet 1  ? dexamethasone (DECADRON) 4 MG tablet TAKE 5 TABLETS BY MOUTH ONCE A WEEK. 20 tablet 4  ? docusate sodium (COLACE) 100 MG capsule Take 1 capsule (100 mg total) by mouth 2 (two) times daily. (Patient not taking: Reported on 11/17/2021) 60 capsule 0  ? ELIQUIS 5 MG TABS tablet TAKE 1 TABLET (5 MG TOTAL) BY MOUTH 2 (TWO) TIMES DAILY. 180 tablet 0  ? feeding supplement (ENSURE ENLIVE / ENSURE PLUS) LIQD Take 237 mLs by mouth 3 (three) times daily between meals. 237 mL 12  ? fluticasone (FLONASE) 50 MCG/ACT nasal spray Place into both nostrils daily.    ? gabapentin (NEURONTIN) 300 MG capsule TAKE 2 CAPSULES (600 MG TOTAL) BY MOUTH 3 (THREE) TIMES DAILY. (  Patient taking differently: 2 (two) times daily.) 180 capsule 2  ? lenalidomide (REVLIMID) 15 MG capsule Take 1 capsule (15 mg total) by mouth daily. Take for 21 days, then hold for 7 days. Repeat every 28 days. (Patient not taking: Reported on 12/14/2021) 21 capsule 0  ? lenalidomide (REVLIMID) 20 MG capsule Take 1 capsule (20 mg total) by mouth daily. Take for 21 days, then hold for 7 days. Repeat every 28 days 21 capsule 0  ? lidocaine (XYLOCAINE) 5 %  ointment Apply topically 3 (three) times daily as needed for mild pain or moderate pain. 35.44 g 0  ? morphine (MS CONTIN) 15 MG 12 hr tablet Take 1 tablet (15 mg total) by mouth every 12 (twelve) hours. 60 tablet 0  ? Multiple Vitamin (MULTIVITAMIN WITH MINERALS) TABS tablet Take 1 tablet by mouth daily.    ? Multiple Vitamins-Minerals (PRESERVISION AREDS PO) Take by mouth.    ? naloxone (NARCAN) nasal spray 4 mg/0.1 mL SPRAY 1 SPRAY INTO ONE NOSTRIL AS DIRECTED FOR OPIOID OVERDOSE (TURN PERSON ON SIDE AFTER DOSE. IF NO RESPONSE IN 2-3 MINUTES OR PERSON RESPONDS BUT RELAPSES, REPEAT USING A NEW SPRAY DEVICE AND SPRAY INTO THE OTHER NOSTRIL. CALL 911 AFTER USE.) * EMERGENCY USE ONLY * (Patient not taking: Reported on 08/11/2021) 1 each 0  ? ondansetron (ZOFRAN) 8 MG tablet Take 1 tablet (8 mg total) by mouth every 8 (eight) hours as needed for nausea or vomiting. 60 tablet 2  ? oxyCODONE (OXY IR/ROXICODONE) 5 MG immediate release tablet Take 1-2 tablets (5-10 mg total) by mouth every 4 (four) hours as needed for severe pain. 90 tablet 0  ? polyethylene glycol (MIRALAX / GLYCOLAX) 17 g packet Take 17 g by mouth 2 (two) times daily.  0  ? potassium citrate (UROCIT-K) 10 MEQ (1080 MG) SR tablet TAKE 1 TABLET (10 MEQ TOTAL) BY MOUTH 3 (THREE) TIMES DAILY WITH MEALS. 270 tablet 2  ? senna-docusate (SENOKOT-S) 8.6-50 MG tablet Take 1 tablet by mouth 2 (two) times daily. 30 tablet 0  ? tamsulosin (FLOMAX) 0.4 MG CAPS capsule TAKE 1 CAPSULE BY MOUTH EVERY DAY 90 capsule 1  ? tamsulosin (FLOMAX) 0.4 MG CAPS capsule Take 1 capsule (0.4 mg total) by mouth daily. 90 capsule 3  ? valACYclovir (VALTREX) 500 MG tablet Take 500 mg by mouth 2 (two) times daily.    ? ?No current facility-administered medications for this visit.  ? ? ?VITAL SIGNS: ?There were no vitals taken for this visit. ?There were no vitals filed for this visit.  ?Estimated body mass index is 24.35 kg/m? as calculated from the following: ?  Height as of an  earlier encounter on 12/15/21: '5\' 9"'  (1.753 m). ?  Weight as of an earlier encounter on 12/15/21: 74.8 kg (164 lb 14.4 oz). ? ?LABS: ?CBC: ?   ?Component Value Date/Time  ? WBC 4.1 12/15/2021 0855  ? HGB 13.3 12/15/2021 0855  ? HGB 11.0 (L) 02/24/2021 1014  ? HCT 41.6 12/15/2021 0855  ? HCT 35.3 (L) 02/24/2021 1014  ? PLT 102 (L) 12/15/2021 0855  ? PLT 249 02/24/2021 1014  ? MCV 97.7 12/15/2021 0855  ? MCV 96 02/24/2021 1014  ? MCV 90 01/24/2013 0819  ? NEUTROABS 2.4 12/15/2021 0855  ? NEUTROABS 7.4 (H) 01/24/2013 0819  ? LYMPHSABS 1.0 12/15/2021 0855  ? LYMPHSABS 1.2 01/24/2013 0819  ? MONOABS 0.5 12/15/2021 0855  ? MONOABS 0.3 01/24/2013 0819  ? EOSABS 0.1 12/15/2021 0855  ? EOSABS  0.0 01/24/2013 0819  ? BASOSABS 0.1 12/15/2021 0855  ? BASOSABS 0.0 01/24/2013 0819  ? ?Comprehensive Metabolic Panel: ?   ?Component Value Date/Time  ? NA 136 12/09/2021 1519  ? NA 136 02/24/2021 1014  ? NA 138 01/24/2013 0819  ? K 4.1 12/09/2021 1519  ? K 3.8 01/24/2013 0819  ? CL 106 12/09/2021 1519  ? CL 105 01/24/2013 0819  ? CO2 24 12/09/2021 1519  ? CO2 25 01/24/2013 0819  ? BUN 27 (H) 12/09/2021 1519  ? BUN 23 02/24/2021 1014  ? BUN 20 (H) 01/24/2013 0819  ? CREATININE 1.33 (H) 12/09/2021 1519  ? CREATININE 1.52 (H) 01/24/2013 0819  ? GLUCOSE 157 (H) 12/09/2021 1519  ? GLUCOSE 171 (H) 01/24/2013 0819  ? CALCIUM 8.5 (L) 12/09/2021 1519  ? CALCIUM 10.4 (H) 02/19/2021 1229  ? AST 28 12/09/2021 1519  ? AST 18 01/24/2013 0819  ? ALT 19 12/09/2021 1519  ? ALT 15 01/24/2013 0819  ? ALKPHOS 70 12/09/2021 1519  ? ALKPHOS 74 01/24/2013 0819  ? BILITOT 0.3 12/09/2021 1519  ? BILITOT 0.4 02/24/2021 1014  ? BILITOT 0.5 01/24/2013 0819  ? PROT 6.3 (L) 12/09/2021 1519  ? PROT 6.2 02/24/2021 1014  ? PROT 6.3 (L) 01/24/2013 0819  ? ALBUMIN 3.7 12/09/2021 1519  ? ALBUMIN 4.5 02/24/2021 1014  ? ALBUMIN 3.6 01/24/2013 0819  ? ? ? ?Present during today's visit: Patient and his wife Jack [name] ? ?Assessment and Plan: ?CBC/BMP reviewed with patient and wife,  okay to continue with dose increased lenalidomide 70m, 21 days on/7off ?  ?Oral Chemotherapy Side Effect/Intolerance:  ?No reported N/V, diarrhea, constipation, or fatigue.  ? ?Oral Chemotherapy Adherence:

## 2021-12-15 NOTE — Progress Notes (Signed)
? ?  ?Palliative Medicine ?[place] [facility] ?Telephone:(336) [phone] Fax:(336) [phone] ? ? ?Name: Jack [name] ?Date: 12/15/2021 ?MRN: [id number]  ?DOB: 08-04-1933 ? ?Patient Care Team: ?[name], MD as PCP - General (Family Medicine) ?Nelva Bush, MD as PCP - Cardiology (Cardiology) ?[name], MD (Endocrinology) ?Lloyd Huger, MD as Consulting Physician (Hematology and Oncology)  ? ? ?REASON FOR CONSULTATION: ?Jack [name] is a 88 y.o. male with multiple medical problems including hypertension, CKD stage IIIb, A. fib on Eliquis, and anemia.  Patient was hospitalized 01/02/2021 to 01/07/2021 with T12 compression fracture with recommendation for conservative management including use of a TLSO brace.  Patient was hospitalized again 01/29/2021 to 02/07/2021 with recurrent back pain and MR showing T12 and new L1 compression fracture.  Patient underwent T12/L1 kyphoplasty on 02/02/2021.  There was also suspicion of a left fifth rib fracture, which occurred while transitioning from the stretcher for his kyphoplasty.  Patient had mild hypercalcemia with SPEP and UPEP concerning for possible myeloma.  Patient was referred to [facility] for work-up.  He was readmitted 03/11/2021 - 03/17/2021 with worsening back pain.  CT revealed new compression fractures of L2 and L3.  Patient underwent kyphoplasty on 6/30.  He was hospitalized again 03/26/2021-04/07/2021 with severe back/flank pain.  MRI revealed T10 pathologic compression fracture.  He was started on treatment for multiple myeloma with dexamethasone/Velcade and XRT to spine.  Palliative care was consulted to help address goals and manage ongoing symptoms.  ? ?SOCIAL HISTORY:    ? reports that he quit smoking about 56 years ago. His smoking use included cigarettes. He has never used smokeless tobacco. He reports current alcohol use of about 1.0 standard drink per week. He reports that he does not use  drugs. ? ?Patient lives at [address] in [place] with his wife of 19 years.  Patient has a son and daughter who are also involved in his care.  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 ? ?ADVANCE DIRECTIVES:  ?On file ? ?CODE STATUS:  ? ?PAST MEDICAL HISTORY: ?Past Medical History:  ?Diagnosis Date  ? Elevated prostate specific antigen (PSA)   ? has been 7 for a year   ? GERD (gastroesophageal reflux disease)   ? History of colon polyps 2008  ? [facility],   ? History of kidney stones   ? Hyperlipidemia   ? Hypertension   ? Prostate hypertrophy   ? diagnosed at age 89 due to hematospermia  ? Renal disorder   ? ? ?PAST SURGICAL HISTORY:  ?Past Surgical History:  ?Procedure Laterality Date  ? CATARACT EXTRACTION W/PHACO Left 01/10/2018  ? Procedure: CATARACT EXTRACTION PHACO AND INTRAOCULAR LENS PLACEMENT (IOC);  Surgeon: Birder Robson, MD;  Location: [facility] ORS;  Service: Ophthalmology;  Laterality: Left;  [name] 00:24.8 ?AP% 14.9 ?CDE 3.68 ?Fluid pack lot # [id number]  ? CATARACT EXTRACTION W/PHACO Right 01/25/2018  ? Procedure: CATARACT EXTRACTION PHACO AND INTRAOCULAR LENS PLACEMENT (IOC);  Surgeon: Birder Robson, MD;  Location: [facility] ORS;  Service: Ophthalmology;  Laterality: Right;  [name] 00:42 ?AP% 10.8 ?CDE 4.59 ?Fluid pack lot # [id number]  ? COLON SURGERY    ? CYSTOSCOPY W/ URETERAL STENT PLACEMENT Right 10/16/2017  ? Procedure: right  URETERAL STENT PLACEMENT,cystoscopy bilateral stent removal,rretrograde;  Surgeon: Abbie Sons, MD;  Location: [facility] ORS;  Service: Urology;  Laterality: Right;  ? CYSTOSCOPY/URETEROSCOPY/HOLMIUM LASER/STENT PLACEMENT Right 12/16/2020  ? Procedure: CYSTOSCOPY/URETEROSCOPY/HOLMIUM LASER/STENT PLACEMENT;  Surgeon: Abbie Sons, MD;  Location: [facility] ORS;  Service: Urology;  Laterality: Right;  ? EXTRACORPOREAL SHOCK WAVE LITHOTRIPSY Right 12/11/2020  ? Procedure: EXTRACORPOREAL SHOCK WAVE LITHOTRIPSY (ESWL);  Surgeon: Abbie Sons, MD;   Location: [facility] ORS;  Service: Urology;  Laterality: Right;  ? IR KYPHO EA ADDL LEVEL THORACIC OR LUMBAR  02/02/2021  ? IR KYPHO LUMBAR INC FX REDUCE BONE BX UNI/BIL CANNULATION INC/IMAGING  02/02/2021  ? KIDNEY STONE SURGERY    ? KYPHOPLASTY N/A 03/12/2021  ? Procedure: KYPHOPLASTY, L2, L3;  Surgeon: Hessie Knows, MD;  Location: [facility] ORS;  Service: Orthopedics;  Laterality: N/A;  ? RESECTION SOFT TISSUE TUMOR LEG / ANKLE RADICAL  jan 2009  ? [facility],  right thigh/knee , nonmalignant  ? SMALL INTESTINE SURGERY  1946  ? implaed on picket fence, punctured stomach  ? TONSILLECTOMY    ? ? ?HEMATOLOGY/ONCOLOGY HISTORY:  ?Oncology History  ?Kappa light chain myeloma ([place])  ?03/12/2021 Initial Diagnosis  ? Kappa light chain myeloma ([facility]) ?  ?03/27/2021 -  Chemotherapy  ?  Patient is on Treatment Plan: MYELOMA NON-TRANSPLANT CANDIDATES VRD SQ Q21D  ? ?  ?  ?04/16/2021 Cancer Staging  ? Staging form: Plasma Cell Myeloma and Plasma Cell Disorders, AJCC 8th Edition ?- Clinical stage from 04/16/2021: Albumin (g/dL): 3.8, ISS: Stage II, High-risk cytogenetics: Absent, LDH: Unknown - Signed by Lloyd Huger, MD on 04/16/2021 ?Albumin range (g/dL): Greater than or equal to 3.5 ?Cytogenetics: Z(61;09) translocation ?Serum calcium level: Normal ?Serum creatinine level: Normal ?Bone disease on imaging: Present ?  ? ? ?ALLERGIES:  is allergic to quinolones, amlodipine, azithromycin, lisinopril, and morphine and related. ? ?MEDICATIONS:  ?Current Outpatient Medications  ?Medication Sig Dispense Refill  ? atorvastatin (LIPITOR) 40 MG tablet Take 1 tablet (40 mg total) by mouth daily at 6 PM. (Patient taking differently: Take 40 mg by mouth. 2 times a week) 90 tablet 3  ? calcitonin, salmon, (MIACALCIN/FORTICAL) 200 UNIT/ACT nasal spray Place 1 spray into alternate nostrils daily. (Patient not taking: Reported on 08/11/2021) 3.7 mL 12  ? Calcium Carbonate-Vit D-Min (CALCIUM 1200 PO) Take by mouth.    ? carvedilol (COREG) 25 MG tablet Take 1  tablet (25 mg total) by mouth 2 (two) times daily with a meal. (Patient taking differently: Take 25 mg by mouth daily.) 60 tablet 1  ? dexamethasone (DECADRON) 4 MG tablet TAKE 5 TABLETS BY MOUTH ONCE A WEEK. 20 tablet 4  ? docusate sodium (COLACE) 100 MG capsule Take 1 capsule (100 mg total) by mouth 2 (two) times daily. (Patient not taking: Reported on 11/17/2021) 60 capsule 0  ? ELIQUIS 5 MG TABS tablet TAKE 1 TABLET (5 MG TOTAL) BY MOUTH 2 (TWO) TIMES DAILY. 180 tablet 0  ? feeding supplement (ENSURE ENLIVE / ENSURE PLUS) LIQD Take 237 mLs by mouth 3 (three) times daily between meals. 237 mL 12  ? fluticasone (FLONASE) 50 MCG/ACT nasal spray Place into both nostrils daily.    ? gabapentin (NEURONTIN) 300 MG capsule TAKE 2 CAPSULES (600 MG TOTAL) BY MOUTH 3 (THREE) TIMES DAILY. (Patient taking differently: 2 (two) times daily.) 180 capsule 2  ? lenalidomide (REVLIMID) 15 MG capsule Take 1 capsule (15 mg total) by mouth daily. Take for 21 days, then hold for 7 days. Repeat every 28 days. (Patient not taking: Reported on 12/14/2021) 21 capsule 0  ? lenalidomide (REVLIMID) 20 MG capsule Take 1 capsule (20 mg total) by mouth daily. Take for 21 days, then hold for  7 days. Repeat every 28 days 21 capsule 0  ? lidocaine (XYLOCAINE) 5 % ointment Apply topically 3 (three) times daily as needed for mild pain or moderate pain. 35.44 g 0  ? morphine (MS CONTIN) 15 MG 12 hr tablet Take 1 tablet (15 mg total) by mouth every 12 (twelve) hours. 60 tablet 0  ? Multiple Vitamin (MULTIVITAMIN WITH MINERALS) TABS tablet Take 1 tablet by mouth daily.    ? Multiple Vitamins-Minerals (PRESERVISION AREDS PO) Take by mouth.    ? naloxone (NARCAN) nasal spray 4 mg/0.1 mL SPRAY 1 SPRAY INTO ONE NOSTRIL AS DIRECTED FOR OPIOID OVERDOSE (TURN PERSON ON SIDE AFTER DOSE. IF NO RESPONSE IN 2-3 MINUTES OR PERSON RESPONDS BUT RELAPSES, REPEAT USING A NEW SPRAY DEVICE AND SPRAY INTO THE OTHER NOSTRIL. CALL 911 AFTER USE.) * EMERGENCY USE ONLY *  (Patient not taking: Reported on 08/11/2021) 1 each 0  ? ondansetron (ZOFRAN) 8 MG tablet Take 1 tablet (8 mg total) by mouth every 8 (eight) hours as needed for nausea or vomiting. 60 tablet 2  ? oxyCODONE (OXY IR/ROXICOD

## 2021-12-16 LAB — KAPPA/LAMBDA LIGHT CHAINS
Kappa free light chain: 61.7 mg/L — ABNORMAL HIGH (ref 3.3–19.4)
Kappa, lambda light chain ratio: 8.45 — ABNORMAL HIGH (ref 0.26–1.65)
Lambda free light chains: 7.3 mg/L (ref 5.7–26.3)

## 2021-12-16 NOTE — Progress Notes (Signed)
12/31/21 ?11:13 AM  ? ?Jack [name] ?04-17-1933 ?[id number] ? ?Referring provider:  ?[name], MD ?[address] ?[place],  [place] [id number] ? ?Chief Complaint  ?Patient presents with  ? Nephrolithiasis  ? ?Urological history  ?Elevated PSA  ?- On watchful waiting he refused further work-up  ? ?2. Nephrolithiasis  ?-Stone analysis was 100% uric acid ?- Right-sided ureteroscopy in [place] in 5364 that was complicated by proximal ureteral perforation with migrated right ureteral stent ?- CT on 12/08/2020 visualized obstructing 11 x 15 mm stone at the right ureteropelvic junction with moderate hydronephrosis and mild perinephric edema. ?- He is s/p ureteroscopic removal of right renal pelvic calculus after failed SWL on 12/16/2020 ?- has not completed a metabolic work up at this time ?- PET scan 11/2021 - punctate stone in the lower pole of left kidney  ? ?3. Right ureteral perforation  ?-occurred with bilateral URS 2019  ?-imaging x several with no extravasation of contrast or hydronephrosis ? ?HPI: ?Jack [name] is a 88 y.o.male who presents today for a 1 year follow-up.  ? ?He is accompanied by his wife today. They report that they had a PSMA PET scan that visualized a kidney stone.  They state that he has been having flank pain reminiscent of previous stones.   ? ?Patient denies any modifying or aggravating factors.  Patient denies any gross hematuria, dysuria.  Patient denies any fevers, chills, nausea or vomiting.  ? ? ?PMH: ?Past Medical History:  ?Diagnosis Date  ? Elevated prostate specific antigen (PSA)   ? has been 9 for a year   ? GERD (gastroesophageal reflux disease)   ? History of colon polyps 2008  ? [facility],   ? History of kidney stones   ? Hyperlipidemia   ? Hypertension   ? Prostate hypertrophy   ? diagnosed at age 89 due to hematospermia  ? Renal disorder   ? ? ?Surgical History: ?Past Surgical History:  ?Procedure Laterality Date  ? CATARACT EXTRACTION W/PHACO Left 01/10/2018  ?  Procedure: CATARACT EXTRACTION PHACO AND INTRAOCULAR LENS PLACEMENT (IOC);  Surgeon: Birder Robson, MD;  Location: [facility] OR[facility];  Service: Ophthalmology;  Laterality: Left;  [name] 00:24.8 ?AP% 14.9 ?CDE 3.68 ?Fluid pack lot # [id number]  ? CATARACT EXTRACTION W/PHACO Right 01/25/2018  ? Procedure: CATARACT EXTRACTION PHACO AND INTRAOCULAR LENS PLACEMENT (IOC);  Surgeon: Birder Robson, MD;  Location: [facility];  Service: Ophthalmology;  Laterality: Right;  [name] 00:42 ?AP% 10.8 ?CDE 4.59 ?Fluid pack lot # [id number]  ? COLON SURGERY    ? CYSTOSCOPY W/ URETERAL STENT PLACEMENT Right 10/16/2017  ? Procedure: right  URETERAL STENT PLACEMENT,cystoscopy bilateral stent removal,rretrograde;  Surgeon: Abbie Sons, MD;  Location: [facility];  Service: Urology;  Laterality: Right;  ? CYSTOSCOPY/URETEROSCOPY/HOLMIUM LASER/STENT PLACEMENT Right 12/16/2020  ? Procedure: CYSTOSCOPY/URETEROSCOPY/HOLMIUM LASER/STENT PLACEMENT;  Surgeon: Abbie Sons, MD;  Location: [facility];  Service: Urology;  Laterality: Right;  ? EXTRACORPOREAL SHOCK WAVE LITHOTRIPSY Right 12/11/2020  ? Procedure: EXTRACORPOREAL SHOCK WAVE LITHOTRIPSY (ESWL);  Surgeon: Abbie Sons, MD;  Location: [facility] OR[facility];  Service: Urology;  Laterality: Right;  ? IR KYPHO EA ADDL LEVEL THORACIC OR LUMBAR  02/02/2021  ? IR KYPHO LUMBAR INC FX REDUCE BONE BX UNI/BIL CANNULATION INC/IMAGING  02/02/2021  ? KIDNEY STONE SURGERY    ? KYPHOPLASTY N/A 03/12/2021  ? Procedure: KYPHOPLASTY, L2, L3;  Surgeon: Hessie Knows, MD;  Location: [facility] OR[facility];  Service: Orthopedics;  Laterality: N/A;  ? RESECTION SOFT TISSUE TUMOR  LEG / ANKLE RADICAL  jan 2009  ? [facility],  right thigh/knee , nonmalignant  ? SMALL INTESTINE SURGERY  1946  ? implaed on picket fence, punctured stomach  ? TONSILLECTOMY    ? ? ?Home Medications:  ?Allergies as of 12/17/2021   ? ?   Reactions  ? Quinolones   ? Aortic dilation contraindicates FQ  ? Amlodipine Other (See Comments)  ? LE edema  ? Azithromycin Nausea And Vomiting  ?  Lisinopril Cough  ? Morphine And Related Nausea And Vomiting  ? ?  ? ?  ?Medication List  ?  ? ?  ? Accurate as of December 17, 2021 11:59 PM. If you have any questions, ask your nurse or doctor.  ?  ?  ? ?  ? ?STOP taking these medications   ? ?calcitonin (salmon) 200 UNIT/ACT nasal spray ?Commonly known as: MIACALCIN/FORTICAL ?  ?docusate sodium 100 MG capsule ?Commonly known as: COLACE ?  ?valACYclovir 500 MG tablet ?Commonly known as: VALTREX ?  ? ?  ? ?TAKE these medications   ? ?atorvastatin 40 MG tablet ?Commonly known as: Lipitor ?Take 1 tablet (40 mg total) by mouth daily at 6 PM. ?What changed:  ?when to take this ?additional instructions ?  ?CALCIUM 1200 PO ?Take by mouth. ?  ?carvedilol 25 MG tablet ?Commonly known as: COREG ?Take 1 tablet (25 mg total) by mouth 2 (two) times daily with a meal. ?What changed: when to take this ?  ?dexamethasone 4 MG tablet ?Commonly known as: DECADRON ?TAKE 5 TABLETS BY MOUTH ONCE A WEEK. ?  ?Eliquis 5 MG Tabs tablet ?Generic drug: apixaban ?TAKE 1 TABLET (5 MG TOTAL) BY MOUTH 2 (TWO) TIMES DAILY. ?  ?feeding supplement Liqd ?Take 237 mLs by mouth 3 (three) times daily between meals. ?  ?fluticasone 50 MCG/ACT nasal spray ?Commonly known as: FLONASE ?Place into both nostrils daily. ?  ?gabapentin 300 MG capsule ?Commonly known as: NEURONTIN ?TAKE 2 CAPSULES (600 MG TOTAL) BY MOUTH 3 (THREE) TIMES DAILY. ?What changed:  ?how much to take ?how to take this ?when to take this ?  ?lenalidomide 15 MG capsule ?Commonly known as: REVLIMID ?Take 1 capsule (15 mg total) by mouth daily. Take for 21 days, then hold for 7 days. Repeat every 28 days. ?  ?lenalidomide 20 MG capsule ?Commonly known as: REVLIMID ?Take 1 capsule (20 mg total) by mouth daily. Take for 21 days, then hold for 7 days. Repeat every 28 days ?  ?lidocaine 5 % ointment ?Commonly known as: XYLOCAINE ?Apply topically 3 (three) times daily as needed for mild pain or moderate pain. ?  ?morphine 15 MG 12 hr  tablet ?Commonly known as: MS CONTIN ?Take 1 tablet (15 mg total) by mouth every 12 (twelve) hours. ?  ?multivitamin with minerals Tabs tablet ?Take 1 tablet by mouth daily. ?  ?naloxone 4 MG/0.1ML Liqd nasal spray kit ?Commonly known as: NARCAN ?SPRAY 1 SPRAY INTO ONE NOSTRIL AS DIRECTED FOR OPIOID OVERDOSE (TURN PERSON ON SIDE AFTER DOSE. IF NO RESPONSE IN 2-3 MINUTES OR PERSON RESPONDS BUT RELAPSES, REPEAT USING A NEW SPRAY DEVICE AND SPRAY INTO THE OTHER NOSTRIL. CALL 911 AFTER USE.) * EMERGENCY USE ONLY * ?  ?ondansetron 8 MG tablet ?Commonly known as: ZOFRAN ?Take 1 tablet (8 mg total) by mouth every 8 (eight) hours as needed for nausea or vomiting. ?  ?oxyCODONE 5 MG immediate release tablet ?Commonly known as: Oxy IR/ROXICODONE ?Take 1-2 tablets (5-10 mg total) by mouth every  4 (four) hours as needed for severe pain. ?  ?polyethylene glycol 17 g packet ?Commonly known as: MIRALAX / GLYCOLAX ?Take 17 g by mouth 2 (two) times daily. ?  ?potassium citrate 10 MEQ (1080 MG) SR tablet ?Commonly known as: UROCIT-K ?TAKE 1 TABLET (10 MEQ TOTAL) BY MOUTH 3 (THREE) TIMES DAILY WITH MEALS. ?  ?PRESERVISION AREDS PO ?Take by mouth. ?  ?senna-docusate 8.6-50 MG tablet ?Commonly known as: Senokot-S ?Take 1 tablet by mouth 2 (two) times daily. ?  ?tamsulosin 0.4 MG Caps capsule ?Commonly known as: FLOMAX ?TAKE 1 CAPSULE BY MOUTH EVERY DAY ?What changed: Another medication with the same name was removed. Continue taking this medication, and follow the directions you see here. ?  ? ?  ? ? ?Allergies:  ?Allergies  ?Allergen Reactions  ? Quinolones   ?  Aortic dilation contraindicates FQ  ? Amlodipine Other (See Comments)  ?  LE edema  ? Azithromycin Nausea And Vomiting  ? Lisinopril Cough  ? Morphine And Related Nausea And Vomiting  ? ? ?Family History: ?Family History  ?Problem Relation Age of Onset  ? Hypertension Father   ? Hyperlipidemia Father   ? Cancer Sister   ?     thyroid - dx in late 20's  ? ? ?Social History:   reports that he quit smoking about 56 years ago. His smoking use included cigarettes. He has never used smokeless tobacco. He reports current alcohol use of about 1.0 standard drink per week. He reports that he does no

## 2021-12-17 ENCOUNTER — Ambulatory Visit: Payer: PPO | Admitting: Urology

## 2021-12-17 VITALS — BP 136/82 | HR 50 | Ht 69.0 in | Wt 164.0 lb

## 2021-12-17 DIAGNOSIS — N200 Calculus of kidney: Secondary | ICD-10-CM

## 2021-12-17 DIAGNOSIS — R972 Elevated prostate specific antigen [PSA]: Secondary | ICD-10-CM | POA: Diagnosis not present

## 2021-12-17 LAB — IMMUNOGLOBULINS A/E/G/M, SERUM
IgA: 89 mg/dL (ref 61–437)
IgE (Immunoglobulin E), Serum: <2 IU/mL — ABNORMAL LOW (ref 6–495)
IgG (Immunoglobin G), Serum: 381 mg/dL — ABNORMAL LOW (ref 603–1613)
IgM (Immunoglobulin M), Srm: 18 mg/dL (ref 15–143)

## 2021-12-18 ENCOUNTER — Other Ambulatory Visit: Payer: Self-pay | Admitting: Pharmacist

## 2021-12-18 DIAGNOSIS — C9000 Multiple myeloma not having achieved remission: Secondary | ICD-10-CM

## 2021-12-18 MED ORDER — MORPHINE SULFATE ER 15 MG PO TBCR: 15.0000 mg | EXTENDED_RELEASE_TABLET | Freq: Two times a day (BID) | ORAL | 0 refills | Status: DC

## 2021-12-21 ENCOUNTER — Other Ambulatory Visit: Payer: Self-pay | Admitting: Emergency Medicine

## 2021-12-21 ENCOUNTER — Encounter: Payer: Self-pay | Admitting: Oncology

## 2021-12-21 DIAGNOSIS — C9000 Multiple myeloma not having achieved remission: Secondary | ICD-10-CM

## 2021-12-22 ENCOUNTER — Other Ambulatory Visit: Payer: Self-pay | Admitting: Oncology

## 2021-12-22 DIAGNOSIS — C9000 Multiple myeloma not having achieved remission: Secondary | ICD-10-CM

## 2021-12-28 DIAGNOSIS — H353231 Exudative age-related macular degeneration, bilateral, with active choroidal neovascularization: Secondary | ICD-10-CM | POA: Diagnosis not present

## 2021-12-31 ENCOUNTER — Encounter: Payer: Self-pay | Admitting: Urology

## 2022-01-02 ENCOUNTER — Other Ambulatory Visit: Payer: Self-pay | Admitting: Oncology

## 2022-01-02 DIAGNOSIS — C9000 Multiple myeloma not having achieved remission: Secondary | ICD-10-CM

## 2022-01-04 ENCOUNTER — Encounter: Payer: Self-pay | Admitting: Internal Medicine

## 2022-01-04 ENCOUNTER — Encounter: Payer: Self-pay | Admitting: Oncology

## 2022-01-06 ENCOUNTER — Other Ambulatory Visit: Payer: Self-pay | Admitting: *Deleted

## 2022-01-06 ENCOUNTER — Ambulatory Visit
Admission: RE | Admit: 2022-01-06 | Discharge: 2022-01-06 | Disposition: A | Discharge status: Home / Self Care | Payer: PPO | Source: Ambulatory Visit | Attending: Urology | Admitting: Urology

## 2022-01-06 DIAGNOSIS — K4090 Unilateral inguinal hernia, without obstruction or gangrene, not specified as recurrent: Secondary | ICD-10-CM | POA: Diagnosis not present

## 2022-01-06 DIAGNOSIS — N400 Enlarged prostate without lower urinary tract symptoms: Secondary | ICD-10-CM | POA: Diagnosis not present

## 2022-01-06 DIAGNOSIS — N200 Calculus of kidney: Secondary | ICD-10-CM | POA: Diagnosis not present

## 2022-01-06 DIAGNOSIS — C9000 Multiple myeloma not having achieved remission: Secondary | ICD-10-CM

## 2022-01-06 DIAGNOSIS — K449 Diaphragmatic hernia without obstruction or gangrene: Secondary | ICD-10-CM | POA: Diagnosis not present

## 2022-01-06 MED ORDER — LENALIDOMIDE 20 MG PO CAPS: 20.0000 mg | ORAL_CAPSULE | Freq: Every day | ORAL | 0 refills | Status: DC

## 2022-01-07 ENCOUNTER — Encounter: Payer: Self-pay | Admitting: Pharmacist

## 2022-01-08 DIAGNOSIS — J9601 Acute respiratory failure with hypoxia: Secondary | ICD-10-CM | POA: Diagnosis not present

## 2022-01-08 DIAGNOSIS — S22000A Wedge compression fracture of unspecified thoracic vertebra, initial encounter for closed fracture: Secondary | ICD-10-CM | POA: Diagnosis not present

## 2022-01-08 DIAGNOSIS — M8440XA Pathological fracture, unspecified site, initial encounter for fracture: Secondary | ICD-10-CM | POA: Diagnosis not present

## 2022-01-08 DIAGNOSIS — C9000 Multiple myeloma not having achieved remission: Secondary | ICD-10-CM | POA: Diagnosis not present

## 2022-01-12 ENCOUNTER — Inpatient Hospital Stay (HOSPITAL_BASED_OUTPATIENT_CLINIC_OR_DEPARTMENT_OTHER): Payer: PPO | Admitting: Nurse Practitioner

## 2022-01-12 ENCOUNTER — Inpatient Hospital Stay: Payer: PPO

## 2022-01-12 ENCOUNTER — Inpatient Hospital Stay: Payer: PPO | Attending: Hospice and Palliative Medicine

## 2022-01-12 VITALS — BP 127/62 | HR 44 | Temp 98.2°F | Resp 16 | Ht 69.0 in | Wt 167.7 lb

## 2022-01-12 DIAGNOSIS — C9000 Multiple myeloma not having achieved remission: Secondary | ICD-10-CM

## 2022-01-12 DIAGNOSIS — D709 Neutropenia, unspecified: Secondary | ICD-10-CM | POA: Diagnosis not present

## 2022-01-12 LAB — COMPREHENSIVE METABOLIC PANEL
ALT: 14 U/L (ref 0–44)
AST: 21 U/L (ref 15–41)
Albumin: 3.5 g/dL (ref 3.5–5.0)
Alkaline Phosphatase: 69 U/L (ref 38–126)
Anion gap: 7 (ref 5–15)
BUN: 32 mg/dL — ABNORMAL HIGH (ref 8–23)
CO2: 27 mmol/L (ref 22–32)
Calcium: 8.6 mg/dL — ABNORMAL LOW (ref 8.9–10.3)
Chloride: 103 mmol/L (ref 98–111)
Creatinine, Ser: 1.18 mg/dL (ref 0.61–1.24)
GFR, Estimated: >60 mL/min (ref >60)
Glucose, Bld: 119 mg/dL — ABNORMAL HIGH (ref 70–99)
Potassium: 4.3 mmol/L (ref 3.5–5.1)
Sodium: 137 mmol/L (ref 135–145)
Total Bilirubin: 0.6 mg/dL (ref 0.3–1.2)
Total Protein: 5.9 g/dL — ABNORMAL LOW (ref 6.5–8.1)

## 2022-01-12 LAB — CBC WITH DIFFERENTIAL/PLATELET
Abs Immature Granulocytes: 0.02 10*3/uL (ref 0.00–0.07)
Basophils Absolute: 0.1 10*3/uL (ref 0.0–0.1)
Basophils Relative: 2 %
Eosinophils Absolute: 0.1 10*3/uL (ref 0.0–0.5)
Eosinophils Relative: 2 %
HCT: 41.2 % (ref 39.0–52.0)
Hemoglobin: 13.2 g/dL (ref 13.0–17.0)
Immature Granulocytes: 1 %
Lymphocytes Relative: 26 %
Lymphs Abs: 1.1 10*3/uL (ref 0.7–4.0)
MCH: 31.2 pg (ref 26.0–34.0)
MCHC: 32 g/dL (ref 30.0–36.0)
MCV: 97.4 fL (ref 80.0–100.0)
Monocytes Absolute: 0.7 10*3/uL (ref 0.1–1.0)
Monocytes Relative: 18 %
Neutro Abs: 2.2 10*3/uL (ref 1.7–7.7)
Neutrophils Relative %: 51 %
Platelets: 129 10*3/uL — ABNORMAL LOW (ref 150–400)
RBC: 4.23 MIL/uL (ref 4.22–5.81)
RDW: 16.2 % — ABNORMAL HIGH (ref 11.5–15.5)
WBC: 4.1 10*3/uL (ref 4.0–10.5)
nRBC: 0 % (ref 0.0–0.2)

## 2022-01-12 MED ORDER — DENOSUMAB 120 MG/1.7ML ~~LOC~~ SOLN
120.0000 mg | Freq: Once | SUBCUTANEOUS | Status: AC
  Administered 2022-01-12: 120 mg via SUBCUTANEOUS
  Filled 2022-01-12: qty 1.7

## 2022-01-12 NOTE — Progress Notes (Signed)
?[place]  ?[phone] [phone] ? ?ID: Jack [name] OB: July 04, 1933  MR#: [id number]  GEZ#:[id number] ? ?Patient Care Team: ?[name], MD as PCP - General (Family Medicine) ?Nelva Bush, MD as PCP - Cardiology (Cardiology) ?[name], MD (Endocrinology) ?Lloyd Huger, MD as Consulting Physician (Hematology and Oncology) ? ?CHIEF COMPLAINT: Stage II kappa chain myeloma ? ?INTERVAL HISTORY: Patient returns to clinic today for repeat laboratory work, further evaluation, and continuation of Revlimid and Xgeva. He has had recent imaging for flank pain that showed kidney stones and previous fractures. He continues to have good appetite and weight is stable. He denies worsening or new bone pain. No neurlogic complaints. Denies recent fevers or illness. He denies chest pain, shortness of breath, cough, or hemoptysis. He denies any nausea, vomiting, constipation, or diarrhea. He denies urinary complaints. Patient offers no further specific complaints today.  ? ?REVIEW OF SYSTEMS:   ?Review of Systems  ?Constitutional:  Positive for malaise/fatigue. Negative for chills, fever and weight loss.  ?HENT:  Negative for hearing loss, nosebleeds, sore throat and tinnitus.   ?Eyes:  Negative for blurred vision and double vision.  ?Respiratory:  Negative for cough, hemoptysis, shortness of breath and wheezing.   ?Cardiovascular:  Negative for chest pain, palpitations and leg swelling.  ?Gastrointestinal:  Negative for abdominal pain, blood in stool, constipation, diarrhea, melena, nausea and vomiting.  ?Genitourinary:  Negative for dysuria, flank pain and urgency.  ?Musculoskeletal:  Positive for back pain. Negative for falls, joint pain and myalgias.  ?Skin:  Negative for itching and rash.  ?Neurological:  Positive for weakness. Negative for dizziness, tingling, sensory change, loss of consciousness and headaches.  ?Endo/Heme/Allergies:  Negative for  environmental allergies. Does not bruise/bleed easily.  ?Psychiatric/Behavioral:  Negative for depression. The patient is not nervous/anxious and does not have insomnia.   ?As per HPI. Otherwise, a complete review of systems is negative. ? ?PAST MEDICAL HISTORY: ?Past Medical History:  ?Diagnosis Date  ? Elevated prostate specific antigen (PSA)   ? has been 7 for a year   ? GERD (gastroesophageal reflux disease)   ? History of colon polyps 2008  ? [facility],   ? History of kidney stones   ? Hyperlipidemia   ? Hypertension   ? Prostate hypertrophy   ? diagnosed at age 18 due to hematospermia  ? Renal disorder   ? ? ?PAST SURGICAL HISTORY: ?Past Surgical History:  ?Procedure Laterality Date  ? CATARACT EXTRACTION W/PHACO Left 01/10/2018  ? Procedure: CATARACT EXTRACTION PHACO AND INTRAOCULAR LENS PLACEMENT (IOC);  Surgeon: Birder Robson, MD;  Location: [facility] ORS;  Service: Ophthalmology;  Laterality: Left;  [name] 00:24.8 ?AP% 14.9 ?CDE 3.68 ?Fluid pack lot # [id number]  ? CATARACT EXTRACTION W/PHACO Right 01/25/2018  ? Procedure: CATARACT EXTRACTION PHACO AND INTRAOCULAR LENS PLACEMENT (IOC);  Surgeon: Birder Robson, MD;  Location: [facility] OR[facility];  Service: Ophthalmology;  Laterality: Right;  [name] 00:42 ?AP% 10.8 ?CDE 4.59 ?Fluid pack lot # [id number]  ? COLON SURGERY    ? CYSTOSCOPY W/ URETERAL STENT PLACEMENT Right 10/16/2017  ? Procedure: right  URETERAL STENT PLACEMENT,cystoscopy bilateral stent removal,rretrograde;  Surgeon: Abbie Sons, MD;  Location: [facility];  Service: Urology;  Laterality: Right;  ? CYSTOSCOPY/URETEROSCOPY/HOLMIUM LASER/STENT PLACEMENT Right 12/16/2020  ? Procedure: CYSTOSCOPY/URETEROSCOPY/HOLMIUM LASER/STENT PLACEMENT;  Surgeon: Abbie Sons, MD;  Location: [facility];  Service: Urology;  Laterality: Right;  ? EXTRACORPOREAL SHOCK WAVE LITHOTRIPSY Right 12/11/2020  ? Procedure: EXTRACORPOREAL SHOCK WAVE LITHOTRIPSY (ESWL);  Surgeon: Abbie Sons, MD;  Location: [facility];  Service: Urology;   Laterality: Right;  ? IR KYPHO EA ADDL LEVEL THORACIC OR LUMBAR  02/02/2021  ? IR KYPHO LUMBAR INC FX REDUCE BONE BX UNI/BIL CANNULATION INC/IMAGING  02/02/2021  ? KIDNEY STONE SURGERY    ? KYPHOPLASTY N/A 03/12/2021  ? Procedure: KYPHOPLASTY, L2, L3;  Surgeon: Hessie Knows, MD;  Location: [facility];  Service: Orthopedics;  Laterality: N/A;  ? RESECTION SOFT TISSUE TUMOR LEG / ANKLE RADICAL  jan 2009  ? [facility],  right thigh/knee , nonmalignant  ? SMALL INTESTINE SURGERY  1946  ? implaed on picket fence, punctured stomach  ? TONSILLECTOMY    ? ? ?FAMILY HISTORY: ?Family History  ?Problem Relation Age of Onset  ? Hypertension Father   ? Hyperlipidemia Father   ? Cancer Sister   ?     thyroid - dx in late 20's  ? ? ?ADVANCED DIRECTIVES (Y/N):  N ? ?HEALTH MAINTENANCE: ?Social History  ? ?Tobacco Use  ? Smoking status: Former  ?  Types: Cigarettes  ?  Quit date: 07/05/1965  ?  Years since quitting: 56.5  ? Smokeless tobacco: Never  ?Vaping Use  ? Vaping Use: Never used  ?Substance Use Topics  ? Alcohol use: Yes  ?  Alcohol/week: 1.0 standard drink  ?  Types: 1 Standard drinks or equivalent per week  ?  Comment: occassionaly  ? Drug use: No  ? ? ? Colonoscopy: ? PAP: ? Bone density: ? Lipid panel: ? ?Allergies  ?Allergen Reactions  ? Quinolones   ?  Aortic dilation contraindicates FQ  ? Amlodipine Other (See Comments)  ?  LE edema  ? Azithromycin Nausea And Vomiting  ? Lisinopril Cough  ? Morphine And Related Nausea And Vomiting  ? ? ?Current Outpatient Medications  ?Medication Sig Dispense Refill  ? atorvastatin (LIPITOR) 40 MG tablet Take 1 tablet (40 mg total) by mouth daily at 6 PM. (Patient taking differently: Take 40 mg by mouth. 2 times a week) 90 tablet 3  ? Calcium Carbonate-Vit D-Min (CALCIUM 1200 PO) Take by mouth.    ? carvedilol (COREG) 25 MG tablet Take 1 tablet (25 mg total) by mouth 2 (two) times daily with a meal. (Patient taking differently: Take 25 mg by mouth daily.) 60 tablet 1  ? dexamethasone  (DECADRON) 4 MG tablet TAKE 5 TABLETS BY MOUTH ONCE A WEEK. 20 tablet 4  ? ELIQUIS 5 MG TABS tablet TAKE 1 TABLET BY MOUTH TWICE A DAY 180 tablet 0  ? feeding supplement (ENSURE ENLIVE / ENSURE PLUS) LIQD Take 237 mLs by mouth 3 (three) times daily between meals. 237 mL 12  ? fluticasone (FLONASE) 50 MCG/ACT nasal spray Place into both nostrils daily.    ? gabapentin (NEURONTIN) 300 MG capsule TAKE 2 CAPSULES BY MOUTH 3 TIMES DAILY. 180 capsule 2  ? ibuprofen (ADVIL) 200 MG tablet Take 200 mg by mouth every 6 (six) hours as needed (1-2 tablets q6h prn).    ? lenalidomide (REVLIMID) 20 MG capsule Take 1 capsule (20 mg total) by mouth daily. Take for 21 days, then hold for 7 days. Repeat every 28 days 21 capsule 0  ? lidocaine (XYLOCAINE) 5 % ointment Apply topically 3 (three) times daily as needed for mild pain or moderate pain. 35.44 g 0  ? morphine (MS CONTIN) 15 MG 12 hr tablet Take 1 tablet (15 mg total) by mouth every 12 (twelve) hours. 60 tablet 0  ? Multiple  Vitamin (MULTIVITAMIN WITH MINERALS) TABS tablet Take 1 tablet by mouth daily.    ? Multiple Vitamins-Minerals (PRESERVISION AREDS PO) Take by mouth.    ? polyethylene glycol (MIRALAX / GLYCOLAX) 17 g packet Take 17 g by mouth 2 (two) times daily.  0  ? potassium citrate (UROCIT-K) 10 MEQ (1080 MG) SR tablet TAKE 1 TABLET (10 MEQ TOTAL) BY MOUTH 3 (THREE) TIMES DAILY WITH MEALS. 270 tablet 2  ? senna-docusate (SENOKOT-S) 8.6-50 MG tablet Take 1 tablet by mouth 2 (two) times daily. 30 tablet 0  ? tamsulosin (FLOMAX) 0.4 MG CAPS capsule TAKE 1 CAPSULE BY MOUTH EVERY DAY 90 capsule 1  ? naloxone (NARCAN) nasal spray 4 mg/0.1 mL SPRAY 1 SPRAY INTO ONE NOSTRIL AS DIRECTED FOR OPIOID OVERDOSE (TURN PERSON ON SIDE AFTER DOSE. IF NO RESPONSE IN 2-3 MINUTES OR PERSON RESPONDS BUT RELAPSES, REPEAT USING A NEW SPRAY DEVICE AND SPRAY INTO THE OTHER NOSTRIL. CALL 911 AFTER USE.) * EMERGENCY USE ONLY * (Patient not taking: Reported on 01/12/2022) 1 each 0  ? ondansetron  (ZOFRAN) 8 MG tablet Take 1 tablet (8 mg total) by mouth every 8 (eight) hours as needed for nausea or vomiting. (Patient not taking: Reported on 01/12/2022) 60 tablet 2  ? oxyCODONE (OXY IR/ROXICODONE) 5 MG immediate r

## 2022-01-12 NOTE — Progress Notes (Signed)
Pt had recent CT scan that showed kidney stones and bone damage.  ?

## 2022-01-13 LAB — KAPPA/LAMBDA LIGHT CHAINS
Kappa free light chain: 56 mg/L — ABNORMAL HIGH (ref 3.3–19.4)
Kappa, lambda light chain ratio: 6.44 — ABNORMAL HIGH (ref 0.26–1.65)
Lambda free light chains: 8.7 mg/L (ref 5.7–26.3)

## 2022-01-15 LAB — IMMUNOGLOBULINS A/E/G/M, SERUM
IgA: 89 mg/dL (ref 61–437)
IgE (Immunoglobulin E), Serum: <2 IU/mL — ABNORMAL LOW (ref 6–495)
IgG (Immunoglobin G), Serum: 339 mg/dL — ABNORMAL LOW (ref 603–1613)
IgM (Immunoglobulin M), Srm: 19 mg/dL (ref 15–143)

## 2022-01-26 ENCOUNTER — Encounter: Payer: Self-pay | Admitting: Oncology

## 2022-01-26 ENCOUNTER — Encounter: Payer: Self-pay | Admitting: Internal Medicine

## 2022-01-26 DIAGNOSIS — H353231 Exudative age-related macular degeneration, bilateral, with active choroidal neovascularization: Secondary | ICD-10-CM | POA: Diagnosis not present

## 2022-01-27 ENCOUNTER — Encounter: Payer: Self-pay | Admitting: Nurse Practitioner

## 2022-01-27 ENCOUNTER — Ambulatory Visit: Payer: PPO | Admitting: Nurse Practitioner

## 2022-01-27 VITALS — BP 126/74 | HR 53 | Ht 68.0 in | Wt 171.0 lb

## 2022-01-27 DIAGNOSIS — E782 Mixed hyperlipidemia: Secondary | ICD-10-CM | POA: Diagnosis not present

## 2022-01-27 DIAGNOSIS — I7121 Aneurysm of the ascending aorta, without rupture: Secondary | ICD-10-CM | POA: Diagnosis not present

## 2022-01-27 DIAGNOSIS — I10 Essential (primary) hypertension: Secondary | ICD-10-CM | POA: Diagnosis not present

## 2022-01-27 DIAGNOSIS — I480 Paroxysmal atrial fibrillation: Secondary | ICD-10-CM

## 2022-01-27 NOTE — Progress Notes (Signed)
? ? ?Office Visit  ?  ?Patient Name: Jack [name] ?Date of Encounter: 01/27/2022 ? ?Primary Care Provider:  [name], MD ?Primary Cardiologist:  Nelva Bush, MD ? ?Chief Complaint  ?  ?88 year old male with a history of paroxysmal atrial fibrillation, PSVT, PACs, multifocal atrial tachycardia, coronary calcium, aortic atherosclerosis, hypertension, hyperlipidemia, hypertriglyceridemia, GERD, stage III chronic kidney disease, BPH, nephrolithiasis, remote tobacco abuse, and myeloma, who presents for follow-up related to PAF. ? ?Past Medical History  ?  ?Past Medical History:  ?Diagnosis Date  ? Ascending aortic aneurysm ([place])   ? a. 12/2020 Echo: Asc Ao 73m, Ao root 478m  ? CKD (chronic [place] disease), [place] III [place]  ? Diastolic dysfunction   ? a. 12/2020 Echo: EF 50-55%, no rwma, mild LVH, GrI DD, nl RV fxn, mild AI. Asc Ao 4873mAo root 72m56m? Elevated prostate specific antigen (PSA)   ? has been 9 for a year   ? GERD (gastroesophageal reflux disease)   ? History of colon polyps 2008  ? [facility]? History of kidney stones   ? Hyperlipidemia   ? Hypertension   ? Myeloma (HCC)[place]? PAF (paroxysmal atrial fibrillation) (HCC)[place]? a. 01/2021-->Eliquis (CHA2DS2VASc = 3-4).  ? Prostate hypertrophy   ? diagnosed at age 66 d31 to hematospermia  ? ?Past Surgical History:  ?Procedure Laterality Date  ? CATARACT EXTRACTION W/PHACO Left 01/10/2018  ? Procedure: CATARACT EXTRACTION PHACO AND INTRAOCULAR LENS PLACEMENT (IOC);  Surgeon: PorfBirder Robson;  Location: [facility];  Service: Ophthalmology;  Laterality: Left;  [place] 0[name]24.8 ?AP% 14.9 ?CDE 3.68 ?Fluid pack lot # [id number]? CATARACT EXTRACTION W/PHACO Right 01/25/2018  ? Procedure: CATARACT EXTRACTION PHACO AND INTRAOCULAR LENS PLACEMENT (IOC);  Surgeon: PorfBirder Robson;  Location: [facility];  Service: Ophthalmology;  Laterality: Right;  [place] 0[name]42 ?AP% 10.8 ?CDE 4.59 ?Fluid pack lot # [id number]? COLON SURGERY    ? CYSTOSCOPY W/ URETERAL  STENT PLACEMENT Right 10/16/2017  ? Procedure: right  URETERAL STENT PLACEMENT,cystoscopy bilateral stent removal,rretrograde;  Surgeon: StoiAbbie Sons;  Location: [facility];  Service: Urology;  Laterality: Right;  ? CYSTOSCOPY/URETEROSCOPY/HOLMIUM LASER/STENT PLACEMENT Right 12/16/2020  ? Procedure: CYSTOSCOPY/URETEROSCOPY/HOLMIUM LASER/STENT PLACEMENT;  Surgeon: StoiAbbie Sons;  Location: [facility];  Service: Urology;  Laterality: Right;  ? EXTRACORPOREAL SHOCK WAVE LITHOTRIPSY Right 12/11/2020  ? Procedure: EXTRACORPOREAL SHOCK WAVE LITHOTRIPSY (ESWL);  Surgeon: StoiAbbie Sons;  Location: [facility];  Service: Urology;  Laterality: Right;  ? IR KYPHO EA ADDL LEVEL THORACIC OR LUMBAR  02/02/2021  ? IR KYPHO LUMBAR INC FX REDUCE BONE BX UNI/BIL CANNULATION INC/IMAGING  02/02/2021  ? KIDNEY STONE SURGERY    ? KYPHOPLASTY N/A 03/12/2021  ? Procedure: KYPHOPLASTY, L2, L3;  Surgeon: MenzHessie Knows;  Location: [facility];  Service: Orthopedics;  Laterality: N/A;  ? RESECTION SOFT TISSUE TUMOR LEG / ANKLE RADICAL  jan 2009  ? [facility],  right thigh/knee , nonmalignant  ? SMALL INTESTINE SURGERY  1946  ? implaed on picket fence, punctured stomach  ? TONSILLECTOMY    ? ? ?Allergies ? ?Allergies  ?Allergen Reactions  ? Quinolones   ?  Aortic dilation contraindicates FQ  ? Amlodipine Other (See Comments)  ?  LE edema  ? Azithromycin Nausea And Vomiting  ? Lisinopril Cough  ? Morphine And Related Nausea And Vomiting  ? ? ?History of Present Illness  ?  ?88 y13r old male with the above  complex past medical history including paroxysmal atrial fibrillation, PSVT, PACs, multifocal atrial tachycardia, coronary calcium, aortic atherosclerosis, dilated ascending aorta/root, hypertension, hyperlipidemia, hypertriglyceridemia, GERD, stage III chronic kidney disease, BPH, nephrolithiasis, remote tobacco abuse, and myeloma.  He was admitted in April 2022 for right flank and mid back pain with finding of right ureteral stone requiring  lithotripsy.  He was subsequently found to have fracture of T12 with plan for conservative therapy.  While hospitalized, he was noted to have intermittent tachycardia and irregular rhythm.  No evidence of A-fib was noted.  An echocardiogram showed an EF of 50 to 55% with mild LVH, grade 1 diastolic dysfunction, and dilated ascending aorta at 48 mm with an aortic root of 40 mm.  A Zio monitor was placed at discharge and this showed 165 atrial runs lasting up to 16.8 seconds at a maximum rate of 193 bpm.  There were no sustained arrhythmias or pauses.  He was readmitted in late May 2022 due to severe back pain requiring vertebroplasty.  During this admission, he did develop atrial fibrillation with rapid ventricular response but subsequently converted to sinus rhythm after about 2 hours, after amiodarone was initiated.  He was subsequently placed on Eliquis initially at 2.5 mg twice daily in the setting of elevated creatinine however, this dose has been increased to 5 mg twice daily following improvement of creatinine.  Amiodarone was subsequently discontinued. ? ?Jack [name] was last seen in cardiology clinic in June 2022, at which time he was doing well.  Today, he says that he has been feeling great.  He walks about a third of a mile a few times per day.  He does use his walker in order to steady himself, as he also has visual difficulties and peripheral neuropathy.  He does not experience chest pain or dyspnea.  He is bothered by bruising on his arms, which he attributes to Eliquis therapy.  We discussed the indications for Eliquis and statin therapy at length today and ultimately, he prefers to stay on both.  He denies chest pain, dyspnea, palpitations, PND, orthopnea, dizziness, syncope, or early satiety.  Sometimes notes swelling in his feet which he attributes to neuropathy. ? ?Home Medications  ?  ?Current Outpatient Medications  ?Medication Sig Dispense Refill  ? atorvastatin (LIPITOR) 40 MG tablet Take 1  tablet (40 mg total) by mouth daily at 6 PM. 90 tablet 3  ? Calcium Carbonate-Vit D-Min (CALCIUM 1200 PO) Take by mouth.    ? carvedilol (COREG) 25 MG tablet Take 1 tablet (25 mg total) by mouth 2 (two) times daily with a meal. 60 tablet 1  ? dexamethasone (DECADRON) 4 MG tablet TAKE 5 TABLETS BY MOUTH ONCE A WEEK. 20 tablet 4  ? ELIQUIS 5 MG TABS tablet TAKE 1 TABLET BY MOUTH TWICE A DAY 180 tablet 0  ? feeding supplement (ENSURE ENLIVE / ENSURE PLUS) LIQD Take 237 mLs by mouth 3 (three) times daily between meals. 237 mL 12  ? fluticasone (FLONASE) 50 MCG/ACT nasal spray Place into both nostrils daily.    ? gabapentin (NEURONTIN) 300 MG capsule TAKE 2 CAPSULES BY MOUTH 3 TIMES DAILY. 180 capsule 2  ? ibuprofen (ADVIL) 200 MG tablet Take 200 mg by mouth every 6 (six) hours as needed (1-2 tablets q6h prn).    ? lenalidomide (REVLIMID) 20 MG capsule Take 1 capsule (20 mg total) by mouth daily. Take for 21 days, then hold for 7 days. Repeat every 28 days 21 capsule 0  ?  lidocaine (XYLOCAINE) 5 % ointment Apply topically 3 (three) times daily as needed for mild pain or moderate pain. 35.44 g 0  ? morphine (MS CONTIN) 15 MG 12 hr tablet Take 1 tablet (15 mg total) by mouth every 12 (twelve) hours. 60 tablet 0  ? Multiple Vitamin (MULTIVITAMIN WITH MINERALS) TABS tablet Take 1 tablet by mouth daily.    ? Multiple Vitamins-Minerals (PRESERVISION AREDS PO) Take by mouth.    ? naloxone (NARCAN) nasal spray 4 mg/0.1 mL SPRAY 1 SPRAY INTO ONE NOSTRIL AS DIRECTED FOR OPIOID OVERDOSE (TURN PERSON ON SIDE AFTER DOSE. IF NO RESPONSE IN 2-3 MINUTES OR PERSON RESPONDS BUT RELAPSES, REPEAT USING A NEW SPRAY DEVICE AND SPRAY INTO THE OTHER NOSTRIL. CALL 911 AFTER USE.) * EMERGENCY USE ONLY * 1 each 0  ? ondansetron (ZOFRAN) 8 MG tablet Take 1 tablet (8 mg total) by mouth every 8 (eight) hours as needed for nausea or vomiting. 60 tablet 2  ? oxyCODONE (OXY IR/ROXICODONE) 5 MG immediate release tablet Take 1-2 tablets (5-10 mg total) by  mouth every 4 (four) hours as needed for severe pain. 90 tablet 0  ? polyethylene glycol (MIRALAX / GLYCOLAX) 17 g packet Take 17 g by mouth 2 (two) times daily.  0  ? potassium citrate (UROCIT-K) 10 MEQ (

## 2022-01-27 NOTE — Patient Instructions (Signed)
Medication Instructions:  ?Your physician has recommended you make the following change in your medication:  ? ?HOLD Lipitor for 2 weeks and let us know if better. ? ?*If you need a refill on your cardiac medications before your next appointment, please call your pharmacy* ? ? ?Lab Work: ?None ? ?If you have labs (blood work) drawn today and your tests are completely normal, you will receive your results only by: ?MyChart Message (if you have MyChart) OR ?A paper copy in the mail ?If you have any lab test that is abnormal or we need to change your treatment, we will call you to review the results. ? ? ?Testing/Procedures: ?Your physician has requested that you have an echocardiogram. Echocardiography is a painless test that uses sound waves to create images of your heart. It provides your doctor with information about the size and shape of your heart and how well your heart?s chambers and valves are working. This procedure takes approximately one hour. There are no restrictions for this procedure. ? ? ? ?Follow-Up: ?At [facility], you and your health needs are our priority.  As part of our continuing mission to provide you with exceptional heart care, we have created designated Provider Care Teams.  These Care Teams include your primary Cardiologist (physician) and Advanced Practice Providers (APPs -  Physician Assistants and Nurse Practitioners) who all work together to provide you with the care you need, when you need it. ? ? ?Your next appointment:   ?6 month(s) ? ?The format for your next appointment:   ?In Person ? ?Provider:   ?Nelva Bush, MD  ? ? ? ? ? ? ?Important Information About Sugar ? ? ? ? ?  ?

## 2022-01-31 ENCOUNTER — Other Ambulatory Visit: Payer: Self-pay | Admitting: Oncology

## 2022-01-31 DIAGNOSIS — C9000 Multiple myeloma not having achieved remission: Secondary | ICD-10-CM

## 2022-02-02 ENCOUNTER — Encounter: Payer: Self-pay | Admitting: Internal Medicine

## 2022-02-02 ENCOUNTER — Encounter: Payer: Self-pay | Admitting: Oncology

## 2022-02-02 ENCOUNTER — Other Ambulatory Visit: Payer: Self-pay | Admitting: *Deleted

## 2022-02-02 DIAGNOSIS — C9000 Multiple myeloma not having achieved remission: Secondary | ICD-10-CM

## 2022-02-03 ENCOUNTER — Encounter: Payer: Self-pay | Admitting: Oncology

## 2022-02-04 ENCOUNTER — Other Ambulatory Visit: Payer: Self-pay | Admitting: *Deleted

## 2022-02-04 ENCOUNTER — Encounter: Payer: Self-pay | Admitting: Internal Medicine

## 2022-02-04 ENCOUNTER — Other Ambulatory Visit: Payer: Self-pay | Admitting: Pharmacist

## 2022-02-04 ENCOUNTER — Encounter: Payer: Self-pay | Admitting: Oncology

## 2022-02-04 DIAGNOSIS — C9000 Multiple myeloma not having achieved remission: Secondary | ICD-10-CM

## 2022-02-04 MED ORDER — MORPHINE SULFATE ER 15 MG PO TBCR: 15.0000 mg | EXTENDED_RELEASE_TABLET | Freq: Two times a day (BID) | ORAL | 0 refills | Status: DC

## 2022-02-04 MED ORDER — LENALIDOMIDE 20 MG PO CAPS: 20.0000 mg | ORAL_CAPSULE | Freq: Every day | ORAL | 0 refills | Status: DC

## 2022-02-05 NOTE — Progress Notes (Unsigned)
[place]  [phone] [phone]  ID: [name] OB: 1933/05/29  MR#: [id number]  AOZ#:[id number]  Patient Care Team: [name], MD as PCP - General (Family Medicine) End, [name], MD as PCP - Cardiology (Cardiology) [name], MD (Endocrinology) Lloyd Huger, MD as Consulting Physician (Hematology and Oncology)   CHIEF COMPLAINT: Stage II kappa chain myeloma.  INTERVAL HISTORY: Patient returns to clinic today for repeat laboratory work, further evaluation, discussion of his imaging results, and continuation of Revlimid and Xgeva.  He continues to have chronic back pain, but otherwise feels well.  He continues to be more active and he and his wife are planning a beach trip later this month.  He has a good appetite and his weight is remained stable.  He continues to tolerate Revlimid well without significant side effects.  He has no neurologic complaints.  He denies any recent fevers or illnesses.  He has no chest pain, shortness of breath, cough, or hemoptysis.  He denies any nausea, vomiting, constipation, or diarrhea.  He has no urinary complaints.  Patient offers no further specific complaints today.  REVIEW OF SYSTEMS:   Review of Systems  Constitutional:  Positive for malaise/fatigue. Negative for fever.  Respiratory: Negative.  Negative for cough, hemoptysis and shortness of breath.   Cardiovascular: Negative.  Negative for chest pain and leg swelling.  Gastrointestinal: Negative.  Negative for abdominal pain.  Genitourinary: Negative.  Negative for dysuria and flank pain.  Musculoskeletal:  Positive for back pain. Negative for joint pain.  Skin: Negative.  Negative for rash.  Neurological:  Positive for weakness. Negative for dizziness, focal weakness and headaches.  Psychiatric/Behavioral: Negative.  The patient is not nervous/anxious.    As per HPI. Otherwise, a complete review of systems is  negative.  PAST MEDICAL HISTORY: Past Medical History:  Diagnosis Date   Ascending aortic aneurysm ([place])    a. 12/2020 Echo: Asc Ao 36m, Ao root 463m   CKD (chronic kidney disease), stage III (HCC)    Diastolic dysfunction    a. 12/2020 Echo: EF 50-55%, no rwma, mild LVH, GrI DD, nl RV fxn, mild AI. Asc Ao 4862mAo root 77m28m Elevated prostate specific antigen (PSA)    has been 7 for a year    GERD (gastroesophageal reflux disease)    History of colon polyps 2008   [facility] of kidney stones    Hyperlipidemia    Hypertension    Myeloma (HCC)[place] PAF (paroxysmal atrial fibrillation) (HCC)[place] a. 01/2021-->Eliquis (CHA2DS2VASc = 3-4).   Prostate hypertrophy    diagnosed at age 83 d89 to hematospermia    PAST SURGICAL HISTORY: Past Surgical History:  Procedure Laterality Date   CATARACT EXTRACTION W/PHACO Left 01/10/2018   Procedure: CATARACT EXTRACTION PHACO AND INTRAOCULAR LENS PLACEMENT (IOC);  Surgeon: PorfBirder Robson;  Location: [facility] ORS;  Service: Ophthalmology;  Laterality: Left;  US 0[name]24.8 AP% 14.9 CDE 3.68 Fluid pack lot # [id number] CATARACT EXTRACTION W/PHACO Right 01/25/2018   Procedure: CATARACT EXTRACTION PHACO AND INTRAOCULAR LENS PLACEMENT (IOC);  Surgeon: PorfBirder Robson;  Location: [facility] ORS;  Service: Ophthalmology;  Laterality: Right;  US 0[name]42 AP% 10.8 CDE 4.59 Fluid pack lot # [id number] COLON SURGERY     CYSTOSCOPY W/ URETERAL STENT PLACEMENT Right 10/16/2017   Procedure: right  URETERAL STENT PLACEMENT,cystoscopy bilateral stent removal,rretrograde;  Surgeon: StoiAbbie Sons;  [facility];  Service: Urology;  Laterality: Right;   CYSTOSCOPY/URETEROSCOPY/HOLMIUM LASER/STENT PLACEMENT Right 12/16/2020   Procedure: CYSTOSCOPY/URETEROSCOPY/HOLMIUM LASER/STENT PLACEMENT;  Surgeon: Abbie Sons, MD;  Location: [facility] ORS;  Service: Urology;  Laterality: Right;   EXTRACORPOREAL SHOCK WAVE LITHOTRIPSY Right 12/11/2020    Procedure: EXTRACORPOREAL SHOCK WAVE LITHOTRIPSY (ESWL);  Surgeon: Abbie Sons, MD;  Location: [facility] ORS;  Service: Urology;  Laterality: Right;   IR KYPHO EA ADDL LEVEL THORACIC OR LUMBAR  02/02/2021   IR KYPHO LUMBAR INC FX REDUCE BONE BX UNI/BIL CANNULATION INC/IMAGING  02/02/2021   KIDNEY STONE SURGERY     KYPHOPLASTY N/A 03/12/2021   Procedure: Hewitt Shorts, L3;  Surgeon: Hessie Knows, MD;  Location: [facility] ORS;  Service: Orthopedics;  Laterality: N/A;   RESECTION SOFT TISSUE TUMOR LEG / ANKLE RADICAL  jan 2009   [facility],  right thigh/knee , nonmalignant   SMALL INTESTINE SURGERY  1946   implaed on picket fence, punctured stomach   TONSILLECTOMY      FAMILY HISTORY: Family History  Problem Relation Age of Onset   Hypertension Father    Hyperlipidemia Father    Cancer Sister        thyroid - dx in late 20's    ADVANCED DIRECTIVES (Y/N):  N  HEALTH MAINTENANCE: Social History   Tobacco Use   Smoking status: Former    Types: Cigarettes    Quit date: 07/05/1965    Years since quitting: 56.6   Smokeless tobacco: Never  Vaping Use   Vaping Use: Never used  Substance Use Topics   Alcohol use: Yes    Alcohol/week: 1.0 standard drink    Types: 1 Standard drinks or equivalent per week    Comment: occassionaly   Drug use: No     Colonoscopy:  PAP:  Bone density:  Lipid panel:  Allergies  Allergen Reactions   Quinolones     Aortic dilation contraindicates FQ   Amlodipine Other (See Comments)    LE edema   Azithromycin Nausea And Vomiting   Lisinopril Cough   Morphine And Related Nausea And Vomiting    Current Outpatient Medications  Medication Sig Dispense Refill   atorvastatin (LIPITOR) 40 MG tablet Take 1 tablet (40 mg total) by mouth daily at 6 PM. 90 tablet 3   Calcium Carbonate-Vit D-Min (CALCIUM 1200 PO) Take by mouth.     carvedilol (COREG) 25 MG tablet Take 1 tablet (25 mg total) by mouth 2 (two) times daily with a meal. 60 tablet 1   dexamethasone  (DECADRON) 4 MG tablet TAKE 5 TABLETS BY MOUTH ONE TIME PER WEEK 20 tablet 4   ELIQUIS 5 MG TABS tablet TAKE 1 TABLET BY MOUTH TWICE A DAY 180 tablet 0   feeding supplement (ENSURE ENLIVE / ENSURE PLUS) LIQD Take 237 mLs by mouth 3 (three) times daily between meals. 237 mL 12   fluticasone (FLONASE) 50 MCG/ACT nasal spray Place into both nostrils daily.     gabapentin (NEURONTIN) 300 MG capsule TAKE 2 CAPSULES BY MOUTH 3 TIMES DAILY. 180 capsule 2   ibuprofen (ADVIL) 200 MG tablet Take 200 mg by mouth every 6 (six) hours as needed (1-2 tablets q6h prn).     lenalidomide (REVLIMID) 20 MG capsule Take 1 capsule (20 mg total) by mouth daily. Take for 21 days, then hold for 7 days. Repeat every 28 days 21 capsule 0   lidocaine (XYLOCAINE) 5 % ointment Apply topically 3 (three) times daily as needed for mild pain or  moderate pain. 35.44 g 0   morphine (MS CONTIN) 15 MG 12 hr tablet Take 1 tablet (15 mg total) by mouth every 12 (twelve) hours. 60 tablet 0   Multiple Vitamin (MULTIVITAMIN WITH MINERALS) TABS tablet Take 1 tablet by mouth daily.     Multiple Vitamins-Minerals (PRESERVISION AREDS PO) Take by mouth.     naloxone (NARCAN) nasal spray 4 mg/0.1 mL SPRAY 1 SPRAY INTO ONE NOSTRIL AS DIRECTED FOR OPIOID OVERDOSE (TURN PERSON ON SIDE AFTER DOSE. IF NO RESPONSE IN 2-3 MINUTES OR PERSON RESPONDS BUT RELAPSES, REPEAT USING A NEW SPRAY DEVICE AND SPRAY INTO THE OTHER NOSTRIL. CALL 911 AFTER USE.) * EMERGENCY USE ONLY * 1 each 0   ondansetron (ZOFRAN) 8 MG tablet Take 1 tablet (8 mg total) by mouth every 8 (eight) hours as needed for nausea or vomiting. 60 tablet 2   oxyCODONE (OXY IR/ROXICODONE) 5 MG immediate release tablet Take 1-2 tablets (5-10 mg total) by mouth every 4 (four) hours as needed for severe pain. 90 tablet 0   polyethylene glycol (MIRALAX / GLYCOLAX) 17 g packet Take 17 g by mouth 2 (two) times daily.  0   potassium citrate (UROCIT-K) 10 MEQ (1080 MG) SR tablet TAKE 1 TABLET (10 MEQ  TOTAL) BY MOUTH 3 (THREE) TIMES DAILY WITH MEALS. 270 tablet 2   senna-docusate (SENOKOT-S) 8.6-50 MG tablet Take 1 tablet by mouth 2 (two) times daily. 30 tablet 0   tamsulosin (FLOMAX) 0.4 MG CAPS capsule TAKE 1 CAPSULE BY MOUTH EVERY DAY 90 capsule 1   No current facility-administered medications for this visit.    OBJECTIVE: There were no vitals filed for this visit.     There is no height or weight on file to calculate BMI.    ECOG FS:1 - Symptomatic but completely ambulatory  General: Well-developed, well-nourished, no acute distress. Eyes: Pink conjunctiva, anicteric sclera. HEENT: Normocephalic, moist mucous membranes. Lungs: No audible wheezing or coughing. Heart: Regular rate and rhythm. Abdomen: Soft, nontender, no obvious distention. Musculoskeletal: No edema, cyanosis, or clubbing. Neuro: Alert, answering all questions appropriately. Cranial nerves grossly intact. Skin: No rashes or petechiae noted. Psych: Normal affect.  LAB RESULTS:  Lab Results  Component Value Date   NA 137 01/12/2022   K 4.3 01/12/2022   CL 103 01/12/2022   CO2 27 01/12/2022   GLUCOSE 119 (H) 01/12/2022   BUN 32 (H) 01/12/2022   CREATININE 1.18 01/12/2022   CALCIUM 8.6 (L) 01/12/2022   PROT 5.9 (L) 01/12/2022   ALBUMIN 3.5 01/12/2022   AST 21 01/12/2022   ALT 14 01/12/2022   ALKPHOS 69 01/12/2022   BILITOT 0.6 01/12/2022   GFRNONAA >60 01/12/2022   GFRAA 29 (L) 10/16/2017    Lab Results  Component Value Date   WBC 4.1 01/12/2022   NEUTROABS 2.2 01/12/2022   HGB 13.2 01/12/2022   HCT 41.2 01/12/2022   MCV 97.4 01/12/2022   PLT 129 (L) 01/12/2022     STUDIES: CT RENAL STONE STUDY  Result Date: 01/06/2022 CLINICAL DATA:  Flank pain EXAM: CT ABDOMEN AND PELVIS WITHOUT CONTRAST TECHNIQUE: Multidetector CT imaging of the abdomen and pelvis was performed following the standard protocol without IV contrast. RADIATION DOSE REDUCTION: This exam was performed according to the  departmental dose-optimization program which includes automated exposure control, adjustment of the mA and/or kV according to patient size and/or use of iterative reconstruction technique. COMPARISON:  CT abdomen and pelvis 03/26/2021 FINDINGS: Lower chest: No acute process identified. Coronary artery calcifications  noted. Hepatobiliary: No focal liver abnormality is seen. No gallstones, gallbladder wall thickening, or biliary dilatation. Pancreas: Unremarkable. No pancreatic ductal dilatation or surrounding inflammatory changes. Spleen: Normal in size without focal abnormality. Adrenals/Urinary Tract: Stable mild nodularity of the adrenal glands left greater than right, measuring less than 10 Hounsfield unit density and likely benign. A few calculi identified in the mid and lower pole right kidney measuring up to 3 mm in the lower pole. No left nephrolithiasis visualized. No hydronephrosis identified bilaterally. Urinary bladder appears within normal limits. Stomach/Bowel: Small hiatal hernia. No bowel obstruction, free air or pneumatosis. Moderate amount of retained fecal material identified throughout the colon. No evidence of acute appendicitis. Vascular/Lymphatic: Severe atherosclerotic disease. No bulky lymphadenopathy identified. Reproductive: Prostate gland is markedly enlarged. Other: No ascites.  Small right inguinal hernia containing fat. Musculoskeletal: Multiple old compression fractures with vertebroplasty changes. Patchy sclerotic densities in the bones most significant at T11. Bones are osteopenic. Old left-sided rib fractures. IMPRESSION: 1. Small right nephrolithiasis.  No hydronephrosis. 2. Small hiatal hernia. 3. Marked prostatomegaly. 4. Other chronic findings as described. Electronically Signed   By: Ofilia Neas M.D.   On: 01/06/2022 12:45    ASSESSMENT: Stage II Kappa chain myeloma.  PLAN:    Stage II kappa chain myeloma: (11:14 translocation, high risk) SPEP essentially negative  and immunoglobulins are within normal limits.  Patient's kappa light chains have improved to greater than 5400 down to 52.3.  Cycle 1 included Velcade and Revlimid, but given his declining performance status and difficulty to get into clinic this was transitioned to Revlimid only.  Given persistent neutropenia, Revlimid was dose reduced to 15 mg daily, but with improvement of his blood counts will attempt to increase dose back to 20 mg daily for 21 days with 7 days off.   He is also taking 20 mg dexamethasone weekly.  PET scan results from December 08, 2021 reviewed independently and reported as above with no obvious evidence of progressive disease.  Proceed with Xgeva today.  Return to clinic in 4 weeks with repeat laboratory work, further evaluation, and continuation of treatment.  Appreciate palliative care and clinical pharmacy input.   Back pain: PET scan results as above.  Pain is likely musculoskeletal in nature.  Can consider MRI in the future if patient wishes.  Continue current narcotic and gabapentin prescriptions.   Anemia: Resolved. Right flank mass: MRI results from July 13, 2021 reviewed independently with no obvious pathology on his right flank, but did reveal several fractured ribs.  Continue narcotic regimen as above. Bone lesions: Continue Xgeva as above. Neutropenia: Increase Revlimid back to 20 mg as above.  Patient expressed understanding and was in agreement with this plan. He also understands that He can call clinic at any time with any questions, concerns, or complaints.    Cancer Staging  Kappa light chain myeloma ([place]) Staging form: Plasma Cell Myeloma and Plasma Cell Disorders, AJCC 8th Edition - Clinical stage from 04/16/2021: Albumin (g/dL): 3.8, ISS: Stage II, High-risk cytogenetics: Absent, LDH: Unknown - Signed by Lloyd Huger, MD on 04/16/2021 Albumin range (g/dL): Greater than or equal to 3.5 Cytogenetics: t(11;14) translocation Serum calcium level: Normal Serum  creatinine level: Normal Bone disease on imaging: Present  Lloyd Huger, MD   02/05/2022 10:17 AM

## 2022-02-07 DIAGNOSIS — J9601 Acute respiratory failure with hypoxia: Secondary | ICD-10-CM | POA: Diagnosis not present

## 2022-02-07 DIAGNOSIS — S22000A Wedge compression fracture of unspecified thoracic vertebra, initial encounter for closed fracture: Secondary | ICD-10-CM | POA: Diagnosis not present

## 2022-02-07 DIAGNOSIS — C9000 Multiple myeloma not having achieved remission: Secondary | ICD-10-CM | POA: Diagnosis not present

## 2022-02-07 DIAGNOSIS — M8440XA Pathological fracture, unspecified site, initial encounter for fracture: Secondary | ICD-10-CM | POA: Diagnosis not present

## 2022-02-09 ENCOUNTER — Inpatient Hospital Stay (HOSPITAL_BASED_OUTPATIENT_CLINIC_OR_DEPARTMENT_OTHER): Payer: PPO | Admitting: Oncology

## 2022-02-09 ENCOUNTER — Inpatient Hospital Stay: Payer: PPO | Admitting: Pharmacist

## 2022-02-09 ENCOUNTER — Inpatient Hospital Stay: Payer: PPO

## 2022-02-09 ENCOUNTER — Encounter: Payer: Self-pay | Admitting: Oncology

## 2022-02-09 VITALS — BP 134/74 | HR 43 | Temp 98.5°F | Resp 18 | Ht 68.0 in | Wt 169.7 lb

## 2022-02-09 DIAGNOSIS — C9000 Multiple myeloma not having achieved remission: Secondary | ICD-10-CM

## 2022-02-09 LAB — COMPREHENSIVE METABOLIC PANEL
ALT: 16 U/L (ref 0–44)
AST: 21 U/L (ref 15–41)
Albumin: 3.5 g/dL (ref 3.5–5.0)
Alkaline Phosphatase: 60 U/L (ref 38–126)
Anion gap: 6 (ref 5–15)
BUN: 30 mg/dL — ABNORMAL HIGH (ref 8–23)
CO2: 26 mmol/L (ref 22–32)
Calcium: 8.6 mg/dL — ABNORMAL LOW (ref 8.9–10.3)
Chloride: 105 mmol/L (ref 98–111)
Creatinine, Ser: 1.14 mg/dL (ref 0.61–1.24)
GFR, Estimated: >60 mL/min (ref >60)
Glucose, Bld: 101 mg/dL — ABNORMAL HIGH (ref 70–99)
Potassium: 4.3 mmol/L (ref 3.5–5.1)
Sodium: 137 mmol/L (ref 135–145)
Total Bilirubin: 0.4 mg/dL (ref 0.3–1.2)
Total Protein: 5.6 g/dL — ABNORMAL LOW (ref 6.5–8.1)

## 2022-02-09 LAB — CBC WITH DIFFERENTIAL/PLATELET
Abs Immature Granulocytes: 0.01 10*3/uL (ref 0.00–0.07)
Basophils Absolute: 0.1 10*3/uL (ref 0.0–0.1)
Basophils Relative: 1 %
Eosinophils Absolute: 0.1 10*3/uL (ref 0.0–0.5)
Eosinophils Relative: 3 %
HCT: 40.3 % (ref 39.0–52.0)
Hemoglobin: 13.1 g/dL (ref 13.0–17.0)
Immature Granulocytes: 0 %
Lymphocytes Relative: 31 %
Lymphs Abs: 1.2 10*3/uL (ref 0.7–4.0)
MCH: 31.6 pg (ref 26.0–34.0)
MCHC: 32.5 g/dL (ref 30.0–36.0)
MCV: 97.3 fL (ref 80.0–100.0)
Monocytes Absolute: 0.6 10*3/uL (ref 0.1–1.0)
Monocytes Relative: 17 %
Neutro Abs: 1.8 10*3/uL (ref 1.7–7.7)
Neutrophils Relative %: 48 %
Platelets: 95 10*3/uL — ABNORMAL LOW (ref 150–400)
RBC: 4.14 MIL/uL — ABNORMAL LOW (ref 4.22–5.81)
RDW: 16.4 % — ABNORMAL HIGH (ref 11.5–15.5)
WBC: 3.8 10*3/uL — ABNORMAL LOW (ref 4.0–10.5)
nRBC: 0 % (ref 0.0–0.2)

## 2022-02-09 MED ORDER — DENOSUMAB 120 MG/1.7ML ~~LOC~~ SOLN
120.0000 mg | Freq: Once | SUBCUTANEOUS | Status: AC
  Administered 2022-02-09: 120 mg via SUBCUTANEOUS
  Filled 2022-02-09: qty 1.7

## 2022-02-09 NOTE — Progress Notes (Signed)
Ca 8.6 ok to give xgeva per MD

## 2022-02-10 LAB — KAPPA/LAMBDA LIGHT CHAINS
Kappa free light chain: 46.2 mg/L — ABNORMAL HIGH (ref 3.3–19.4)
Kappa, lambda light chain ratio: 4.28 — ABNORMAL HIGH (ref 0.26–1.65)
Lambda free light chains: 10.8 mg/L (ref 5.7–26.3)

## 2022-02-11 LAB — IMMUNOGLOBULINS A/E/G/M, SERUM
IgA: 81 mg/dL (ref 61–437)
IgE (Immunoglobulin E), Serum: <2 IU/mL — ABNORMAL LOW (ref 6–495)
IgG (Immunoglobin G), Serum: 310 mg/dL — ABNORMAL LOW (ref 603–1613)
IgM (Immunoglobulin M), Srm: 21 mg/dL (ref 15–143)

## 2022-02-12 MED ORDER — EZETIMIBE 10 MG PO TABS: 10.0000 mg | ORAL_TABLET | Freq: Every day | ORAL | 3 refills | Status: DC

## 2022-02-15 ENCOUNTER — Telehealth: Payer: PPO | Admitting: Hospice and Palliative Medicine

## 2022-02-17 ENCOUNTER — Telehealth: Payer: PPO | Admitting: Hospice and Palliative Medicine

## 2022-02-17 ENCOUNTER — Inpatient Hospital Stay: Payer: PPO | Attending: Oncology | Admitting: Hospice and Palliative Medicine

## 2022-02-17 DIAGNOSIS — D649 Anemia, unspecified: Secondary | ICD-10-CM | POA: Insufficient documentation

## 2022-02-17 DIAGNOSIS — I480 Paroxysmal atrial fibrillation: Secondary | ICD-10-CM | POA: Insufficient documentation

## 2022-02-17 DIAGNOSIS — G893 Neoplasm related pain (acute) (chronic): Secondary | ICD-10-CM | POA: Insufficient documentation

## 2022-02-17 DIAGNOSIS — I129 Hypertensive chronic kidney disease with stage 1 through stage 4 chronic kidney disease, or unspecified chronic kidney disease: Secondary | ICD-10-CM | POA: Insufficient documentation

## 2022-02-17 DIAGNOSIS — Z79899 Other long term (current) drug therapy: Secondary | ICD-10-CM | POA: Insufficient documentation

## 2022-02-17 DIAGNOSIS — Z7901 Long term (current) use of anticoagulants: Secondary | ICD-10-CM | POA: Insufficient documentation

## 2022-02-17 DIAGNOSIS — N1832 Chronic kidney disease, stage 3b: Secondary | ICD-10-CM | POA: Insufficient documentation

## 2022-02-17 DIAGNOSIS — C9000 Multiple myeloma not having achieved remission: Secondary | ICD-10-CM | POA: Insufficient documentation

## 2022-02-17 DIAGNOSIS — Z515 Encounter for palliative care: Secondary | ICD-10-CM

## 2022-02-17 NOTE — Progress Notes (Signed)
Virtual Visit via Telephone Note  I connected with Jack [name] on 02/17/22 at  1:30 PM EDT by telephone and verified that I am speaking with the correct person using two identifiers.  Location: Patient: Home Provider: Clinic   I discussed the limitations, risks, security and privacy concerns of performing an evaluation and management service by telephone and the availability of in person appointments. I also discussed with the patient that there may be a patient responsible charge related to this service. The patient expressed understanding and agreed to proceed.   History of Present Illness: Jack [name] is a 88 y.o. male with multiple medical problems including hypertension, CKD stage IIIb, A. fib on Eliquis, and anemia.  Patient was hospitalized 01/02/2021 to 01/07/2021 with T12 compression fracture with recommendation for conservative management including use of a TLSO brace.  Patient was hospitalized again 01/29/2021 to 02/07/2021 with recurrent back pain and MR showing T12 and new L1 compression fracture.  Patient underwent T12/L1 kyphoplasty on 02/02/2021.  There was also suspicion of a left fifth rib fracture, which occurred while transitioning from the stretcher for his kyphoplasty.  Patient had mild hypercalcemia with SPEP and UPEP concerning for possible myeloma.  Patient was [facility] to [facility]up.  He was readmitted 03/11/2021 - 03/17/2021 with worsening back pain.  CT revealed new compression fractures of L2 and L3.  Patient underwent kyphoplasty on 6/30.  He was hospitalized again 03/26/2021-04/07/2021 with severe back/flank pain.  MRI revealed T10 pathologic compression fracture.  He was started on treatment for multiple myeloma with dexamethasone/Velcade and XRT to spine.  Palliative care was consulted to help address goals and manage ongoing symptoms.    Observations/Objective: Patient reports he is doing well.  He denies significant changes or concerns.  He does say  that he was rotated off statin due to myalgias and that that was associated with significant improvement in his back pain.  He has been on Zetia for a week or 2 and feels like the back pain has worsened some.  Patient does continue to take the MS Contin twice daily and is tolerating it well.  He remains active without significant changes in performance status.  Assessment and Plan: Neoplasm related pain -stable.  Continue MS Contin twice daily.  This was recently refilled by Dr. Grayland Ormond.  Follow Up Instructions: Follow-up telephone visit 2 to 3 months   I discussed the assessment and treatment plan with the patient. The patient was provided an opportunity to ask questions and all were answered. The patient agreed with the plan and demonstrated an understanding of the instructions.   The patient was advised to call back or seek an in-person evaluation if the symptoms worsen or if the condition fails to improve as anticipated.  I provided 7 minutes of non-face-to-face time during this encounter.   Irean Hong, NP

## 2022-02-23 DIAGNOSIS — H43812 Vitreous degeneration, left eye: Secondary | ICD-10-CM | POA: Diagnosis not present

## 2022-02-23 DIAGNOSIS — H353211 Exudative age-related macular degeneration, right eye, with active choroidal neovascularization: Secondary | ICD-10-CM | POA: Diagnosis not present

## 2022-02-23 DIAGNOSIS — H35033 Hypertensive retinopathy, bilateral: Secondary | ICD-10-CM | POA: Diagnosis not present

## 2022-02-23 DIAGNOSIS — H353231 Exudative age-related macular degeneration, bilateral, with active choroidal neovascularization: Secondary | ICD-10-CM | POA: Diagnosis not present

## 2022-02-23 DIAGNOSIS — H353221 Exudative age-related macular degeneration, left eye, with active choroidal neovascularization: Secondary | ICD-10-CM | POA: Diagnosis not present

## 2022-03-02 ENCOUNTER — Other Ambulatory Visit: Payer: Self-pay | Admitting: *Deleted

## 2022-03-02 DIAGNOSIS — C9000 Multiple myeloma not having achieved remission: Secondary | ICD-10-CM

## 2022-03-02 MED ORDER — LENALIDOMIDE 20 MG PO CAPS: 20.0000 mg | ORAL_CAPSULE | Freq: Every day | ORAL | 0 refills | Status: DC

## 2022-03-05 NOTE — Progress Notes (Signed)
[place]  [phone] [phone]  ID: [name] OB: [birth year]  MR#: [id number]  FXT#:[id number]  Patient Care Team: [name], MD as PCP - General (Family Medicine) End, [name], MD as PCP - Cardiology (Cardiology) [name], MD (Endocrinology) Lloyd Huger, MD as Consulting Physician (Hematology and Oncology)   CHIEF COMPLAINT: Stage II kappa chain myeloma.  INTERVAL HISTORY: Patient returns to clinic today for repeat laboratory work, further evaluation, and continuation of Revlimid and Xgeva.  He continues to have weakness and fatigue and back pain, but otherwise feels well.  He has no neurologic complaints.  He denies any recent fevers or illnesses.  He has a good appetite and denies weight loss.  He has no chest pain, shortness of breath, cough, or hemoptysis.  He denies any nausea, vomiting, constipation, or diarrhea.  He has no urinary complaints.  Patient offers no further specific complaints today.  REVIEW OF SYSTEMS:   Review of Systems  Constitutional:  Positive for malaise/fatigue. Negative for fever.  Respiratory: Negative.  Negative for cough, hemoptysis and shortness of breath.   Cardiovascular: Negative.  Negative for chest pain and leg swelling.  Gastrointestinal: Negative.  Negative for abdominal pain.  Genitourinary: Negative.  Negative for dysuria and flank pain.  Musculoskeletal:  Positive for back pain. Negative for joint pain.  Skin: Negative.  Negative for rash.  Neurological:  Positive for weakness. Negative for dizziness, focal weakness and headaches.  Psychiatric/Behavioral: Negative.  The patient is not nervous/anxious.     As per HPI. Otherwise, a complete review of systems is negative.  PAST MEDICAL HISTORY: Past Medical History:  Diagnosis Date   Ascending aortic aneurysm ([place])    a. 12/2020 Echo: Asc Ao 37m, Ao root 46m   CKD (chronic kidney disease), stage III (HCC)     Diastolic dysfunction    a. 12/2020 Echo: EF 50-55%, no rwma, mild LVH, GrI DD, nl RV fxn, mild AI. Asc Ao 4887mAo root 36m37m Elevated prostate specific antigen (PSA)    has been 7 for a year    GERD (gastroesophageal reflux disease)    History of colon polyps 2008   [facility] History of kidney stones    Hyperlipidemia    Hypertension    Myeloma (HCC)[place] PAF (paroxysmal atrial fibrillation) (HCC)Ross a. 01/2021-->Eliquis (CHA2DS2VASc = 3-4).   Prostate hypertrophy    diagnosed at age 74 d28 to hematospermia    PAST SURGICAL HISTORY: Past Surgical History:  Procedure Laterality Date   CATARACT EXTRACTION W/PHACO Left 01/10/2018   Procedure: CATARACT EXTRACTION PHACO AND INTRAOCULAR LENS PLACEMENT (IOC);  Surgeon: PorfBirder Robson;  Location: [facility] ORS;  Service: Ophthalmology;  Laterality: Left;  US 0[name]24.8 AP% 14.9 CDE 3.68 Fluid pack lot # [id number] CATARACT EXTRACTION W/PHACO Right 01/25/2018   Procedure: CATARACT EXTRACTION PHACO AND INTRAOCULAR LENS PLACEMENT (IOC);  Surgeon: PorfBirder Robson;  Location: [facility] OR[facility];  Service: Ophthalmology;  Laterality: Right;  US 0[name]42 AP% 10.8 CDE 4.59 Fluid pack lot # [id number] COLON SURGERY     CYSTOSCOPY W/ URETERAL STENT PLACEMENT Right 10/16/2017   Procedure: right  URETERAL STENT PLACEMENT,cystoscopy bilateral stent removal,rretrograde;  Surgeon: StoiAbbie Sons;  Location: [facility] ORS;  Service: Urology;  Laterality: Right;   CYSTOSCOPY/URETEROSCOPY/HOLMIUM LASER/STENT PLACEMENT Right 12/16/2020   Procedure: CYSTOSCOPY/URETEROSCOPY/HOLMIUM LASER/STENT PLACEMENT;  Surgeon: StoiAbbie Sons;  Location: [facility] OR[facility];  Service: Urology;  Laterality: Right;   EXTRACORPOREAL SHOCK WAVE LITHOTRIPSY Right 12/11/2020   Procedure: EXTRACORPOREAL SHOCK WAVE LITHOTRIPSY (ESWL);  Surgeon: Abbie Sons, MD;  Location: [facility] OR[facility];  Service: Urology;  Laterality: Right;   IR KYPHO EA ADDL LEVEL THORACIC OR LUMBAR  02/02/2021   IR  KYPHO LUMBAR INC FX REDUCE BONE BX UNI/BIL CANNULATION INC/IMAGING  02/02/2021   KIDNEY STONE SURGERY     KYPHOPLASTY N/A 03/12/2021   Procedure: Hewitt Shorts, L3;  Surgeon: Hessie Knows, MD;  Location: [facility] OR[facility];  Service: Orthopedics;  Laterality: N/A;   RESECTION SOFT TISSUE TUMOR LEG / ANKLE RADICAL  jan 2009   [facility],  right thigh/knee , nonmalignant   SMALL INTESTINE SURGERY  1946   implaed on picket fence, punctured stomach   TONSILLECTOMY      FAMILY HISTORY: Family History  Problem Relation Age of Onset   Hypertension Father    Hyperlipidemia Father    Cancer Sister        thyroid - dx in late 20's    ADVANCED DIRECTIVES (Y/N):  N  HEALTH MAINTENANCE: Social History   Tobacco Use   Smoking status: Former    Types: Cigarettes    Quit date: 07/05/1965    Years since quitting: 56.7   Smokeless tobacco: Never  Vaping Use   Vaping Use: Never used  Substance Use Topics   Alcohol use: Yes    Alcohol/week: 1.0 standard drink of alcohol    Types: 1 Standard drinks or equivalent per week    Comment: occassionaly   Drug use: No     Colonoscopy:  PAP:  Bone density:  Lipid panel:  Allergies  Allergen Reactions   Quinolones     Aortic dilation contraindicates FQ   Amlodipine Other (See Comments)    LE edema   Azithromycin Nausea And Vomiting   Lisinopril Cough   Morphine And Related Nausea And Vomiting    Current Outpatient Medications  Medication Sig Dispense Refill   Calcium Carbonate-Vit D-Min (CALCIUM 1200 PO) Take by mouth.     carvedilol (COREG) 25 MG tablet Take 1 tablet (25 mg total) by mouth 2 (two) times daily with a meal. 60 tablet 1   dexamethasone (DECADRON) 4 MG tablet TAKE 5 TABLETS BY MOUTH ONE TIME PER WEEK 20 tablet 4   ELIQUIS 5 MG TABS tablet TAKE 1 TABLET BY MOUTH TWICE A DAY 180 tablet 0   feeding supplement (ENSURE ENLIVE / ENSURE PLUS) LIQD Take 237 mLs by mouth 3 (three) times daily between meals. 237 mL 12   gabapentin (NEURONTIN)  300 MG capsule TAKE 2 CAPSULES BY MOUTH 3 TIMES DAILY. 180 capsule 2   ibuprofen (ADVIL) 200 MG tablet Take 200 mg by mouth every 6 (six) hours as needed (1-2 tablets q6h prn).     lenalidomide (REVLIMID) 20 MG capsule Take 1 capsule (20 mg total) by mouth daily. Take for 21 days, then hold for 7 days. Repeat every 28 days 21 capsule 0   morphine (MS CONTIN) 15 MG 12 hr tablet Take 1 tablet (15 mg total) by mouth every 12 (twelve) hours. 60 tablet 0   Multiple Vitamin (MULTIVITAMIN WITH MINERALS) TABS tablet Take 1 tablet by mouth daily.     Multiple Vitamins-Minerals (PRESERVISION AREDS PO) Take by mouth.     polyethylene glycol (MIRALAX / GLYCOLAX) 17 g packet Take 17 g by mouth 2 (two) times daily.  0   potassium citrate (UROCIT-K) 10 MEQ (1080 MG) SR tablet TAKE 1  TABLET (10 MEQ TOTAL) BY MOUTH 3 (THREE) TIMES DAILY WITH MEALS. 270 tablet 2   senna-docusate (SENOKOT-S) 8.6-50 MG tablet Take 1 tablet by mouth 2 (two) times daily. 30 tablet 0   tamsulosin (FLOMAX) 0.4 MG CAPS capsule TAKE 1 CAPSULE BY MOUTH EVERY DAY 90 capsule 1   fluticasone (FLONASE) 50 MCG/ACT nasal spray Place into both nostrils daily. (Patient not taking: Reported on 02/09/2022)     lidocaine (XYLOCAINE) 5 % ointment Apply topically 3 (three) times daily as needed for mild pain or moderate pain. (Patient not taking: Reported on 02/09/2022) 35.44 g 0   naloxone (NARCAN) nasal spray 4 mg/0.1 mL SPRAY 1 SPRAY INTO ONE NOSTRIL AS DIRECTED FOR OPIOID OVERDOSE (TURN PERSON ON SIDE AFTER DOSE. IF NO RESPONSE IN 2-3 MINUTES OR PERSON RESPONDS BUT RELAPSES, REPEAT USING A NEW SPRAY DEVICE AND SPRAY INTO THE OTHER NOSTRIL. CALL 911 AFTER USE.) * EMERGENCY USE ONLY * (Patient not taking: Reported on 02/09/2022) 1 each 0   ondansetron (ZOFRAN) 8 MG tablet Take 1 tablet (8 mg total) by mouth every 8 (eight) hours as needed for nausea or vomiting. (Patient not taking: Reported on 02/09/2022) 60 tablet 2   oxyCODONE (OXY IR/ROXICODONE) 5 MG  immediate release tablet Take 1-2 tablets (5-10 mg total) by mouth every 4 (four) hours as needed for severe pain. (Patient not taking: Reported on 02/09/2022) 90 tablet 0   No current facility-administered medications for this visit.    OBJECTIVE: Vitals:   03/11/22 1516  BP: (!) 152/68  Pulse: (!) 48  Resp: 16  Temp: 98.5 F (36.9 C)  SpO2: 97%      Body mass index is 26.73 kg/m.    ECOG FS:1 - Symptomatic but completely ambulatory  General: Well-developed, well-nourished, no acute distress. Eyes: Pink conjunctiva, anicteric sclera. HEENT: Normocephalic, moist mucous membranes. Lungs: No audible wheezing or coughing. Heart: Regular rate and rhythm. Abdomen: Soft, nontender, no obvious distention. Musculoskeletal: No edema, cyanosis, or clubbing. Neuro: Alert, answering all questions appropriately. Cranial nerves grossly intact. Skin: No rashes or petechiae noted. Psych: Normal affect.   LAB RESULTS:  Lab Results  Component Value Date   NA 139 03/11/2022   K 4.1 03/11/2022   CL 106 03/11/2022   CO2 23 03/11/2022   GLUCOSE 125 (H) 03/11/2022   BUN 35 (H) 03/11/2022   CREATININE 1.12 03/11/2022   CALCIUM 8.6 (L) 03/11/2022   PROT 5.5 (L) 03/11/2022   ALBUMIN 3.3 (L) 03/11/2022   AST 22 03/11/2022   ALT 14 03/11/2022   ALKPHOS 65 03/11/2022   BILITOT 0.5 03/11/2022   GFRNONAA >60 03/11/2022   GFRAA 29 (L) 10/16/2017    Lab Results  Component Value Date   WBC 4.6 03/11/2022   NEUTROABS 2.5 03/11/2022   HGB 11.7 (L) 03/11/2022   HCT 35.8 (L) 03/11/2022   MCV 98.6 03/11/2022   PLT 107 (L) 03/11/2022     STUDIES: ECHOCARDIOGRAM COMPLETE  Result Date: 03/10/2022    ECHOCARDIOGRAM REPORT   Patient Name:   ADI DORO [facility] Date of Exam: 03/09/2022 Medical Rec #:  [id number]       Height:       68.0 in Accession #:    [id number]      Weight:       169.7 lb Date of Birth:  Nov 05, 1932      BSA:          1.906 m Patient Age:    88 years  BP:           140/78  mmHg Patient Gender: M               HR:           45 bpm. Exam Location:  [place] Procedure: 2D Echo, Cardiac Doppler and Color Doppler Indications:    I71.21 (ICD-10-CM) - Aneurysm of ascending aorta without rupture                 [facility])  History:        Patient has prior history of Echocardiogram examinations, most                 recent 01/07/2021. Arrythmias:Atrial Fibrillation; Risk                 Factors:Hypertension, Former Smoker and Dyslipidemia.  Sonographer:    Wilkie Aye RVT Referring Phys: [place]  1. Left ventricular ejection fraction, by estimation, is 50 to 55%. The left ventricle has low normal function. The left ventricle has no regional wall motion abnormalities. Left ventricular diastolic parameters are indeterminate.  2. Right ventricular systolic function is normal. The right ventricular size is normal. Tricuspid regurgitation signal is inadequate for assessing PA pressure.  3. The mitral valve is normal in structure. Mild mitral valve regurgitation.  4. The aortic valve is tricuspid. There is mild thickening of the aortic valve. Aortic valve regurgitation is mild to moderate.  5. Aortic dilatation noted. There is mild dilatation of the aortic root, measuring 40 mm. There is moderate to severe dilatation of the ascending aorta, measuring 51 mm. There is moderate dilatation of the aortic arch, measuring 46 mm.  6. The inferior vena cava is normal in size with greater than 50% respiratory variability, suggesting right atrial pressure of 3 mmHg. Comparison(s): 12/2020 Echo: Asc Ao 50m, Ao root 427mEF 50-55%. FINDINGS  Left Ventricle: Left ventricular ejection fraction, by estimation, is 50 to 55%. The left ventricle has low normal function. The left ventricle has no regional wall motion abnormalities. The left ventricular internal cavity size was normal in size. There is borderline left ventricular hypertrophy. Left ventricular diastolic parameters are  indeterminate. Right Ventricle: The right ventricular size is normal. No increase in right ventricular wall thickness. Right ventricular systolic function is normal. Tricuspid regurgitation signal is inadequate for assessing PA pressure. Left Atrium: Left atrial size was normal in size. Right Atrium: Right atrial size was normal in size. Pericardium: There is no evidence of pericardial effusion. Mitral Valve: The mitral valve is normal in structure. Mild mitral valve regurgitation. Tricuspid Valve: The tricuspid valve is not well visualized. Tricuspid valve regurgitation is not demonstrated. Aortic Valve: The aortic valve is tricuspid. There is mild thickening of the aortic valve. Aortic valve regurgitation is mild to moderate. Aortic regurgitation PHT measures 771 msec. Aortic valve mean gradient measures 3.0 mmHg. Aortic valve peak gradient measures 7.5 mmHg. Pulmonic Valve: The pulmonic valve was not well visualized. Pulmonic valve regurgitation is not visualized. No evidence of pulmonic stenosis. Aorta: Aortic dilatation noted. There is mild dilatation of the aortic root, measuring 40 mm. There is moderate to severe dilatation of the ascending aorta, measuring 51 mm. There is moderate dilatation of the aortic arch, measuring 46 mm. Pulmonary Artery: The pulmonary artery is not well seen. Venous: The inferior vena cava is normal in size with greater than 50% respiratory variability, suggesting right atrial pressure of 3 mmHg. IAS/Shunts: No atrial level shunt detected  by color flow Doppler.  LEFT VENTRICLE PLAX 2D LVIDd:         5.20 cm LVIDs:         4.10 cm LV PW:         0.90 cm LV IVS:        1.00 cm LVOT diam:     2.10 cm LVOT Area:     3.46 cm  RIGHT VENTRICLE          IVC RV Basal diam:  3.70 cm  IVC diam: 1.80 cm LEFT ATRIUM             Index        RIGHT ATRIUM          Index LA diam:        4.30 cm 2.26 cm/m   RA Area:     9.63 cm LA Vol (A2C):   25.7 ml 13.48 ml/m  RA Volume:   18.30 ml 9.60 ml/m  LA Vol (A4C):   30.9 ml 16.21 ml/m LA Biplane Vol: 28.2 ml 14.80 ml/m  AORTIC VALVE                   PULMONIC VALVE AV Vmax:           137.00 cm/s PV Vmax:       0.74 m/s AV Vmean:          84.000 cm/s PV Peak grad:  2.2 mmHg AV VTI:            0.379 m AV Peak Grad:      7.5 mmHg AV Mean Grad:      3.0 mmHg AI PHT:            771 msec AR Vena Contracta: 0.20 cm  AORTA Ao Root diam: 4.00 cm Ao Asc diam:  5.05 cm Ao Arch diam: 4.6 cm MITRAL VALVE MV Area (PHT): 5.16 cm    SHUNTS MV Decel Time: 147 msec    Systemic Diam: 2.10 cm MV E velocity: 94.00 cm/s MV A velocity: 61.60 cm/s MV E/A ratio:  1.53 [name] End MD Electronically signed by Nelva Bush MD Signature Date/Time: 03/10/2022/12:58:01 PM    Final     ASSESSMENT: Stage II Kappa chain myeloma.  PLAN:    Stage II kappa chain myeloma: (11:14 translocation, high risk) SPEP essentially negative and immunoglobulins are within normal limits.  Patient's kappa light chains have improved to greater than 5400 down to 46.2, today's results are pending.  Cycle 1 included Velcade and Revlimid, but given his declining performance status and difficulty to get into clinic this was transitioned to Revlimid only.  Given persistent neutropenia, Revlimid was previously dose reduced to 15 mg daily, but patient is now tolerating an increased dose of 20 mg for 21 days with 7 days off.   He is also taking 20 mg dexamethasone weekly.  PET scan results from December 08, 2021 reviewed independently with no obvious evidence of progressive disease.  Patient initiated monthly Xgeva on August 11, 2021.  Proceed with Xgeva today.  Return to clinic in 4 weeks with repeat laboratory, further evaluation, and continuation of treatment.  Appreciate clinical pharmacy input.   Back pain: Chronic and unchanged.  PET scan results as above.  Pain is likely musculoskeletal in nature.  We will get MRI thoracic and lumbar spine to further evaluate. Continue current narcotic and gabapentin  prescriptions.   Anemia: Resolved. Right flank mass: MRI results from July 13, 2021 reviewed independently with no obvious pathology on his right flank, but did reveal several fractured ribs.  Continue narcotic regimen as above. Bone lesions: Continue Xgeva as above. Neutropenia: Resolved.  Continue Revlimid 20 mg as above. Thrombocytopenia: Mildly improved.  Patient's most recent platelet count was 107. Anemia: Mild, monitor.  Patient expressed understanding and was in agreement with this plan. He also understands that He can call clinic at any time with any questions, concerns, or complaints.    Cancer Staging  Kappa light chain myeloma ([place]) Staging form: Plasma Cell Myeloma and Plasma Cell Disorders, AJCC 8th Edition - Clinical stage from 04/16/2021: Albumin (g/dL): 3.8, ISS: Stage II, High-risk cytogenetics: Absent, LDH: Unknown - Signed by Lloyd Huger, MD on 04/16/2021 Albumin range (g/dL): Greater than or equal to 3.5 Cytogenetics: t(11;14) translocation Serum calcium level: Normal Serum creatinine level: Normal Bone disease on imaging: Present  Lloyd Huger, MD   03/12/2022 2:07 PM

## 2022-03-08 ENCOUNTER — Encounter: Payer: Self-pay | Admitting: Oncology

## 2022-03-09 ENCOUNTER — Other Ambulatory Visit: Payer: Self-pay | Admitting: Oncology

## 2022-03-09 ENCOUNTER — Ambulatory Visit (INDEPENDENT_AMBULATORY_CARE_PROVIDER_SITE_OTHER): Payer: PPO

## 2022-03-09 ENCOUNTER — Encounter: Payer: Self-pay | Admitting: Pharmacist

## 2022-03-09 DIAGNOSIS — I7121 Aneurysm of the ascending aorta, without rupture: Secondary | ICD-10-CM | POA: Diagnosis not present

## 2022-03-09 DIAGNOSIS — C9000 Multiple myeloma not having achieved remission: Secondary | ICD-10-CM

## 2022-03-09 MED ORDER — MORPHINE SULFATE ER 15 MG PO TBCR: 15.0000 mg | EXTENDED_RELEASE_TABLET | Freq: Two times a day (BID) | ORAL | 0 refills | Status: DC

## 2022-03-10 ENCOUNTER — Telehealth: Payer: Self-pay | Admitting: *Deleted

## 2022-03-10 ENCOUNTER — Ambulatory Visit: Payer: PPO | Admitting: Oncology

## 2022-03-10 ENCOUNTER — Ambulatory Visit: Payer: PPO

## 2022-03-10 ENCOUNTER — Other Ambulatory Visit: Payer: PPO

## 2022-03-10 DIAGNOSIS — M8440XA Pathological fracture, unspecified site, initial encounter for fracture: Secondary | ICD-10-CM | POA: Diagnosis not present

## 2022-03-10 DIAGNOSIS — I77810 Thoracic aortic ectasia: Secondary | ICD-10-CM

## 2022-03-10 DIAGNOSIS — N1832 Chronic kidney disease, stage 3b: Secondary | ICD-10-CM

## 2022-03-10 DIAGNOSIS — I480 Paroxysmal atrial fibrillation: Secondary | ICD-10-CM

## 2022-03-10 DIAGNOSIS — J9601 Acute respiratory failure with hypoxia: Secondary | ICD-10-CM | POA: Diagnosis not present

## 2022-03-10 DIAGNOSIS — S22000A Wedge compression fracture of unspecified thoracic vertebra, initial encounter for closed fracture: Secondary | ICD-10-CM | POA: Diagnosis not present

## 2022-03-10 DIAGNOSIS — C9000 Multiple myeloma not having achieved remission: Secondary | ICD-10-CM | POA: Diagnosis not present

## 2022-03-10 DIAGNOSIS — I7121 Aneurysm of the ascending aorta, without rupture: Secondary | ICD-10-CM

## 2022-03-10 LAB — ECHOCARDIOGRAM COMPLETE
AV Mean grad: 3 mmHg
AV Peak grad: 7.5 mmHg
AV Vena cont: 0.2 cm
Ao pk vel: 1.37 m/s
Area-P 1/2: 5.16 cm2
P 1/2 time: 771 msec
S' Lateral: 4.1 cm

## 2022-03-10 NOTE — Telephone Encounter (Signed)
Reviewed results and recommendations with patient and spouse. Encouraged them to call back or send My Chart message with best time for me to call them. They both verbalized understanding, agreement with plan, and may still have some questions given that it is so much information at once. Orders have been entered and provided number for her to call and schedule.

## 2022-03-10 NOTE — Telephone Encounter (Signed)
Pt's spouse returning a call about results. She states that she is home now and will answer if returned today.

## 2022-03-10 NOTE — Telephone Encounter (Signed)
-----   Message from Theora Gianotti, NP sent at 03/10/2022  1:50 PM EDT ----- Low normal heart squeezing function.  Mildly leaky mitral valve.  Mild to moderately leaky aortic valve.  Dilated aorta with increase in size to 51 mm of the ascending aorta.  The aortic root measures 40 mm while the aortic arch measures 46 mm.  Given increase in size of the ascending aorta, he will need a CT angiogram of the chest to better evaluate his aorta.  His kidney function was stable on May 30.  In addition to the CTA, please refer to thoracic surgery to establish care following CTA.

## 2022-03-10 NOTE — Telephone Encounter (Signed)
Left voicemail message to call back for review of results.  

## 2022-03-11 ENCOUNTER — Encounter: Payer: Self-pay | Admitting: Oncology

## 2022-03-11 ENCOUNTER — Inpatient Hospital Stay: Payer: PPO

## 2022-03-11 ENCOUNTER — Inpatient Hospital Stay (HOSPITAL_BASED_OUTPATIENT_CLINIC_OR_DEPARTMENT_OTHER): Payer: PPO | Admitting: Oncology

## 2022-03-11 VITALS — BP 152/68 | HR 48 | Temp 98.5°F | Resp 16 | Ht 68.0 in | Wt 175.8 lb

## 2022-03-11 DIAGNOSIS — C9000 Multiple myeloma not having achieved remission: Secondary | ICD-10-CM

## 2022-03-11 DIAGNOSIS — I480 Paroxysmal atrial fibrillation: Secondary | ICD-10-CM | POA: Diagnosis not present

## 2022-03-11 DIAGNOSIS — M549 Dorsalgia, unspecified: Secondary | ICD-10-CM | POA: Diagnosis not present

## 2022-03-11 DIAGNOSIS — N1832 Chronic kidney disease, stage 3b: Secondary | ICD-10-CM | POA: Diagnosis not present

## 2022-03-11 DIAGNOSIS — Z7901 Long term (current) use of anticoagulants: Secondary | ICD-10-CM | POA: Diagnosis not present

## 2022-03-11 DIAGNOSIS — G893 Neoplasm related pain (acute) (chronic): Secondary | ICD-10-CM | POA: Diagnosis not present

## 2022-03-11 DIAGNOSIS — I129 Hypertensive chronic kidney disease with stage 1 through stage 4 chronic kidney disease, or unspecified chronic kidney disease: Secondary | ICD-10-CM | POA: Diagnosis not present

## 2022-03-11 DIAGNOSIS — D649 Anemia, unspecified: Secondary | ICD-10-CM | POA: Diagnosis not present

## 2022-03-11 DIAGNOSIS — Z79899 Other long term (current) drug therapy: Secondary | ICD-10-CM | POA: Diagnosis not present

## 2022-03-11 LAB — COMPREHENSIVE METABOLIC PANEL
ALT: 14 U/L (ref 0–44)
AST: 22 U/L (ref 15–41)
Albumin: 3.3 g/dL — ABNORMAL LOW (ref 3.5–5.0)
Alkaline Phosphatase: 65 U/L (ref 38–126)
Anion gap: 10 (ref 5–15)
BUN: 35 mg/dL — ABNORMAL HIGH (ref 8–23)
CO2: 23 mmol/L (ref 22–32)
Calcium: 8.6 mg/dL — ABNORMAL LOW (ref 8.9–10.3)
Chloride: 106 mmol/L (ref 98–111)
Creatinine, Ser: 1.12 mg/dL (ref 0.61–1.24)
GFR, Estimated: >60 mL/min (ref >60)
Glucose, Bld: 125 mg/dL — ABNORMAL HIGH (ref 70–99)
Potassium: 4.1 mmol/L (ref 3.5–5.1)
Sodium: 139 mmol/L (ref 135–145)
Total Bilirubin: 0.5 mg/dL (ref 0.3–1.2)
Total Protein: 5.5 g/dL — ABNORMAL LOW (ref 6.5–8.1)

## 2022-03-11 LAB — CBC WITH DIFFERENTIAL/PLATELET
Abs Immature Granulocytes: 0.04 10*3/uL (ref 0.00–0.07)
Basophils Absolute: 0 10*3/uL (ref 0.0–0.1)
Basophils Relative: 0 %
Eosinophils Absolute: 0 10*3/uL (ref 0.0–0.5)
Eosinophils Relative: 0 %
HCT: 35.8 % — ABNORMAL LOW (ref 39.0–52.0)
Hemoglobin: 11.7 g/dL — ABNORMAL LOW (ref 13.0–17.0)
Immature Granulocytes: 1 %
Lymphocytes Relative: 24 %
Lymphs Abs: 1.1 10*3/uL (ref 0.7–4.0)
MCH: 32.2 pg (ref 26.0–34.0)
MCHC: 32.7 g/dL (ref 30.0–36.0)
MCV: 98.6 fL (ref 80.0–100.0)
Monocytes Absolute: 0.9 10*3/uL (ref 0.1–1.0)
Monocytes Relative: 21 %
Neutro Abs: 2.5 10*3/uL (ref 1.7–7.7)
Neutrophils Relative %: 54 %
Platelets: 107 10*3/uL — ABNORMAL LOW (ref 150–400)
RBC: 3.63 MIL/uL — ABNORMAL LOW (ref 4.22–5.81)
RDW: 15.6 % — ABNORMAL HIGH (ref 11.5–15.5)
WBC: 4.6 10*3/uL (ref 4.0–10.5)
nRBC: 0 % (ref 0.0–0.2)

## 2022-03-11 MED ORDER — DENOSUMAB 120 MG/1.7ML ~~LOC~~ SOLN
120.0000 mg | Freq: Once | SUBCUTANEOUS | Status: AC
  Administered 2022-03-11: 120 mg via SUBCUTANEOUS
  Filled 2022-03-11: qty 1.7

## 2022-03-12 ENCOUNTER — Encounter: Payer: Self-pay | Admitting: Oncology

## 2022-03-12 ENCOUNTER — Encounter: Payer: Self-pay | Admitting: Internal Medicine

## 2022-03-12 LAB — KAPPA/LAMBDA LIGHT CHAINS
Kappa free light chain: 49.4 mg/L — ABNORMAL HIGH (ref 3.3–19.4)
Kappa, lambda light chain ratio: 4.99 — ABNORMAL HIGH (ref 0.26–1.65)
Lambda free light chains: 9.9 mg/L (ref 5.7–26.3)

## 2022-03-15 LAB — IMMUNOGLOBULINS A/E/G/M, SERUM
IgA: 106 mg/dL (ref 61–437)
IgE (Immunoglobulin E), Serum: <2 IU/mL — ABNORMAL LOW (ref 6–495)
IgG (Immunoglobin G), Serum: 353 mg/dL — ABNORMAL LOW (ref 603–1613)
IgM (Immunoglobulin M), Srm: 17 mg/dL (ref 15–143)

## 2022-03-17 ENCOUNTER — Encounter: Payer: Self-pay | Admitting: Nurse Practitioner

## 2022-03-17 ENCOUNTER — Other Ambulatory Visit: Payer: PPO | Admitting: Nurse Practitioner

## 2022-03-17 DIAGNOSIS — R5381 Other malaise: Secondary | ICD-10-CM

## 2022-03-17 DIAGNOSIS — Z515 Encounter for palliative care: Secondary | ICD-10-CM

## 2022-03-17 DIAGNOSIS — C9000 Multiple myeloma not having achieved remission: Secondary | ICD-10-CM | POA: Diagnosis not present

## 2022-03-17 DIAGNOSIS — R531 Weakness: Secondary | ICD-10-CM

## 2022-03-17 NOTE — Progress Notes (Signed)
[facility] Consult Note Telephone: [phone]  Fax: [phone]    Date of encounter: 03/17/22 1:07 PM PATIENT NAME: Jack [name] [address] [place] [address]   [id number] (home)  DOB: 05-10-1933 MRN: [id number] PRIMARY CARE PROVIDER:    [name], MD,  [place] [address] [phone]  RESPONSIBLE PARTY:    Contact Information     Name Relation Home Work [place] Spouse [phone]  [phone]      Due to the COVID-19 crisis, this visit was done via telemedicine from my office and it was initiated and consent by this patient and or family.  I connected with Jack [name] and Jack [name] on 03/17/22 by a telephone as video not available enabled telemedicine application and verified that I am speaking with the correct person using two identifiers.   I discussed the limitations of evaluation and management by telemedicine. The patient expressed understanding and agreed to proceed. Palliative Care was asked to follow this patient by consultation request of  [name], MD to address advance care planning and complex medical decision making. This is a follow up visit.                                  ASSESSMENT AND PLAN / RECOMMENDATIONS:  Symptom Management/Plan: Symptom Management/Plan: 1. Advance Care Planning; DNR; Currently taking oral chemotherapy, doing well. Wishes to continue    2. Generalized weakness with debility secondary to multiple myeloma and chronic pain. Discussed with Jack [name] he continues to improve, ambulating, no recent falls. Jack [name] endorses he has been ambulating outside, doing well. Doing his ADL's. Today, Jack. [name] shared he was vacuuming, sweeping. We talked about energy conservation, not doing to much, sleeping well. Pain improved with adjustment.    3. Anorexia; improved with weight gain, talked about weight gain, appetite is  significantly improved.  11/17/2021 weight 165.3 lbs 03/11/2022 weight 175 lbs\BMI 26.73 03/11/2022 labs reviewed; albumin 3.3; total protein 5.5  4. Palliative care encounter; Palliative care encounter; Palliative medicine team will continue to support patient, patient's family, and medical team. Visit consisted of counseling and education dealing with the complex and emotionally intense issues of symptom management and palliative care in the setting of serious and potentially life-threatening illness  Cancer Staging  Kappa light chain myeloma ([place]) Staging form: Plasma Cell Myeloma and Plasma Cell Disorders, AJCC 8th Edition - Clinical stage from 04/16/2021: Albumin (g/dL): 3.8, ISS: Stage II, High-risk cytogenetics: Absent, LDH: Unknown - Signed by Jack Huger, MD on 04/16/2021 Albumin range (g/dL): Greater than or equal to 3.5 Cytogenetics: t(11;14) translocation Serum calcium level: Normal Serum creatinine level: Normal Bone disease on imaging: Present\\  Noted:  Low normal heart squeezing function.  Mildly leaky mitral valve.  Mild to moderately leaky aortic valve. Dilated aorta with increase in size to 51 mm of the ascending aorta.  Aortic root measures 40 mm while the aortic arch measures 46 mm with increase in size of the ascending aorta, Will have CT angiogram of the chest to better evaluate his aorta.  Kidney function was stable on May 30.  Pending CTA scheduled, and pending appointment with thoracic surgery to establish care following CTA.  Follow up Palliative Care Visit: Palliative care will continue to follow for complex medical decision making, advance care planning, and clarification of goals. Return 8 weeks or  prn.  I spent 31 minutes providing this consultation. More than 50% of the time in this consultation was spent in counseling and care coordination. PPS: 50% Chief Complaint: Follow up palliative consult for complex medical decision making, address goals, manage ongoing  symptoms  HISTORY OF PRESENT ILLNESS:  Jack [name] is a 88 y.o. year old male  with multiple medical problems including hypertension, CKD stage IIIb, A. fib on Eliquis, and anemia.    Hospitalized 01/02/2021 to 01/07/2021 with T12 compression fracture with recommendation for conservative management including use of a TLSO brace.   Hospitalized 01/29/2021 to 02/07/2021 with recurrent back pain and Jack showing T12 and new L1 compression fracture. Underwent T12/L1 kyphoplasty on 02/02/2021. Suspicion of a left fifth rib fracture, which occurred while transitioning from the stretcher for his kyphoplasty.  Jack. [name] had mild hypercalcemia with SPEP and UPEP concerning for possible myeloma.    Hospitalized 03/11/2021 - 03/17/2021 with worsening back pain, CT revealed new compression fractures of L2 and L3, underwent kyphoplasty on 6/30.   Hospitalized 03/26/2021-04/07/2021 with severe back/flank pain, MRI revealed T10 pathologic compression fracture, started on treatment for multiple myeloma. Last Oncology visit 11/17/2021 with Jack [name] receive Delton See with weekly dexamethasone, plan to return to clinic for labs, PET scan then 4 weeks for further evaluation. Back pain with current narcotic with gabapentin. Right flank mass: MRI results from July 13, 2021 reviewed independently with no obvious pathology on his right flank, but did reveal several fractured ribs. I called Jack [name] for t/u PC visit telemedicine telephonic as video not available. Jack [name] in agreement. I talked about Jack [name] about how Jack [name] has been feeling. Jack. [name] endorses he has been using the vacuum and mop today. Energy level is good today with improved weakness, other days are more difficulty but overall improved. We talked about ros, symptoms, appetite which is good with weight gain. We talked about recent Oncology visit with plan to have f/u MRI, and CTA. Discussed chronic pain, current regimen with increase in Ms Contin 34m  q12 hrs; Oxycodone IR 549m([id number] as needed; gabapentin 600m5md. We talked about overall functional ability has improved. Discussed will have f/u visit following MRI and CTA/Thoracic appointment for further discussion of goc once more information received. Medical goals reviewed. Talked about f/u PC, scheduled visit. Therapeutic listening, emotional support provided. Questions answered.   History obtained from review of EMR, discussion with Jack [name] and Jack. SwanHarriott reviewed available labs, medications, imaging, studies and related documents from the EMR.  Records reviewed and summarized above.   ROS 10 point reviewed all negative except HPI  Physical Exam: deferred Thank you for the opportunity to participate in the care of Jack. SwanVandergrifthe palliative care team will continue to follow. Please call our office at [phone] can be of additional assistance.   Lainee Lehrman Z GuIhor Gully

## 2022-03-22 ENCOUNTER — Encounter: Payer: Self-pay | Admitting: Oncology

## 2022-03-22 ENCOUNTER — Ambulatory Visit
Admission: RE | Admit: 2022-03-22 | Discharge: 2022-03-22 | Disposition: A | Discharge status: Home / Self Care | Payer: PPO | Source: Ambulatory Visit | Attending: Nurse Practitioner | Admitting: Nurse Practitioner

## 2022-03-22 ENCOUNTER — Other Ambulatory Visit: Payer: Self-pay | Admitting: *Deleted

## 2022-03-22 DIAGNOSIS — I77810 Thoracic aortic ectasia: Secondary | ICD-10-CM | POA: Diagnosis not present

## 2022-03-22 DIAGNOSIS — J9811 Atelectasis: Secondary | ICD-10-CM | POA: Diagnosis not present

## 2022-03-22 DIAGNOSIS — I7121 Aneurysm of the ascending aorta, without rupture: Secondary | ICD-10-CM | POA: Insufficient documentation

## 2022-03-22 MED ORDER — IOHEXOL 350 MG/ML SOLN
75.0000 mL | Freq: Once | INTRAVENOUS | Status: AC | PRN
  Administered 2022-03-22: 75 mL via INTRAVENOUS

## 2022-03-22 NOTE — Telephone Encounter (Signed)
error 

## 2022-03-24 ENCOUNTER — Other Ambulatory Visit: Payer: Self-pay | Admitting: Pharmacist

## 2022-03-24 ENCOUNTER — Other Ambulatory Visit: Payer: Self-pay | Admitting: *Deleted

## 2022-03-24 ENCOUNTER — Inpatient Hospital Stay: Payer: PPO | Attending: Oncology

## 2022-03-24 DIAGNOSIS — C9000 Multiple myeloma not having achieved remission: Secondary | ICD-10-CM | POA: Insufficient documentation

## 2022-03-24 DIAGNOSIS — D696 Thrombocytopenia, unspecified: Secondary | ICD-10-CM | POA: Insufficient documentation

## 2022-03-24 LAB — CBC WITH DIFFERENTIAL/PLATELET
Abs Immature Granulocytes: 0.03 10*3/uL (ref 0.00–0.07)
Basophils Absolute: 0 10*3/uL (ref 0.0–0.1)
Basophils Relative: 1 %
Eosinophils Absolute: 0.1 10*3/uL (ref 0.0–0.5)
Eosinophils Relative: 3 %
HCT: 39.3 % (ref 39.0–52.0)
Hemoglobin: 12.4 g/dL — ABNORMAL LOW (ref 13.0–17.0)
Immature Granulocytes: 1 %
Lymphocytes Relative: 25 %
Lymphs Abs: 0.9 10*3/uL (ref 0.7–4.0)
MCH: 32 pg (ref 26.0–34.0)
MCHC: 31.6 g/dL (ref 30.0–36.0)
MCV: 101.3 fL — ABNORMAL HIGH (ref 80.0–100.0)
Monocytes Absolute: 0.3 10*3/uL (ref 0.1–1.0)
Monocytes Relative: 8 %
Neutro Abs: 2.3 10*3/uL (ref 1.7–7.7)
Neutrophils Relative %: 62 %
Platelets: 101 10*3/uL — ABNORMAL LOW (ref 150–400)
RBC: 3.88 MIL/uL — ABNORMAL LOW (ref 4.22–5.81)
RDW: 16 % — ABNORMAL HIGH (ref 11.5–15.5)
WBC: 3.6 10*3/uL — ABNORMAL LOW (ref 4.0–10.5)
nRBC: 0 % (ref 0.0–0.2)

## 2022-03-24 LAB — PROTIME-INR
INR: 1.4 — ABNORMAL HIGH (ref 0.8–1.2)
Prothrombin Time: 17 seconds — ABNORMAL HIGH (ref 11.4–15.2)

## 2022-03-24 MED ORDER — MORPHINE SULFATE ER 30 MG PO TBCR: 30.0000 mg | EXTENDED_RELEASE_TABLET | Freq: Two times a day (BID) | ORAL | 0 refills | Status: DC

## 2022-03-24 NOTE — Telephone Encounter (Signed)
Patients wife requesting new prescription for 30 mg, dose was increased and he has used the 15 mg tablets, leaving to go out of town on Friday.

## 2022-03-25 ENCOUNTER — Ambulatory Visit
Admission: RE | Admit: 2022-03-25 | Discharge: 2022-03-25 | Disposition: A | Discharge status: Home / Self Care | Payer: PPO | Source: Ambulatory Visit | Attending: Oncology | Admitting: Oncology

## 2022-03-25 DIAGNOSIS — M5126 Other intervertebral disc displacement, lumbar region: Secondary | ICD-10-CM | POA: Diagnosis not present

## 2022-03-25 DIAGNOSIS — M549 Dorsalgia, unspecified: Secondary | ICD-10-CM | POA: Diagnosis not present

## 2022-03-25 DIAGNOSIS — M47815 Spondylosis without myelopathy or radiculopathy, thoracolumbar region: Secondary | ICD-10-CM | POA: Diagnosis not present

## 2022-03-25 MED ORDER — GADOBUTROL 1 MMOL/ML IV SOLN
7.5000 mL | Freq: Once | INTRAVENOUS | Status: AC | PRN
  Administered 2022-03-25: 7.5 mL via INTRAVENOUS

## 2022-03-29 ENCOUNTER — Inpatient Hospital Stay (HOSPITAL_BASED_OUTPATIENT_CLINIC_OR_DEPARTMENT_OTHER): Payer: PPO | Admitting: Hospice and Palliative Medicine

## 2022-03-29 DIAGNOSIS — G893 Neoplasm related pain (acute) (chronic): Secondary | ICD-10-CM

## 2022-03-29 NOTE — Progress Notes (Signed)
Virtual Visit via Telephone Note  I connected with [name] on 03/29/22 at  2:30 PM EDT by telephone and verified that I am speaking with the correct person using two identifiers.  Location: Patient: Home Provider: Clinic   I discussed the limitations, risks, security and privacy concerns of performing an evaluation and management service by telephone and the availability of in person appointments. I also discussed with the patient that there may be a patient responsible charge related to this service. The patient expressed understanding and agreed to proceed.   History of Present Illness: FORD PEDDIE is a 88 y.o. male with multiple medical problems including hypertension, CKD stage IIIb, A. fib on Eliquis, and anemia.  Patient was hospitalized 01/02/2021 to 01/07/2021 with T12 compression fracture with recommendation for conservative management including use of a TLSO brace.  Patient was hospitalized again 01/29/2021 to 02/07/2021 with recurrent back pain and MR showing T12 and new L1 compression fracture.  Patient underwent T12/L1 kyphoplasty on 02/02/2021.  There was also suspicion of a left fifth rib fracture, which occurred while transitioning from the stretcher for his kyphoplasty.  Patient had mild hypercalcemia with SPEP and UPEP concerning for possible myeloma.  Patient was referred to [facility] for work-up.  He was readmitted 03/11/2021 - 03/17/2021 with worsening back pain.  CT revealed new compression fractures of L2 and L3.  Patient underwent kyphoplasty on 6/30.  He was hospitalized again 03/26/2021-04/07/2021 with severe back/flank pain.  MRI revealed T10 pathologic compression fracture.  He was started on treatment for multiple myeloma with dexamethasone/Velcade and XRT to spine.    Observations/Objective: Spoke with patient and wife by phone.  He has had recent worsening of mid to low back pain.  Thoracic MRI revealed new T11 pathologic compression fracture.  Patient has  history of chronic compression fractures and underwent kyphoplasty in 2022 by Dr. Rudene Christians.   Patient recently had MS Contin dose increased to 30 mg every 12 hours by Dr. Grayland Ormond.  This is helping patient's pain.  Assessment and Plan: Compression fracture -I reached out to Dr. Rudene Christians who is no longer performing kyphoplasty.  We will send referral to IR.  Continue MS Contin for pain.  Follow Up Instructions: As previously scheduled   I discussed the assessment and treatment plan with the patient. The patient was provided an opportunity to ask questions and all were answered. The patient agreed with the plan and demonstrated an understanding of the instructions.   The patient was advised to call back or seek an in-person evaluation if the symptoms worsen or if the condition fails to improve as anticipated.  I provided 10 minutes of non-face-to-face time during this encounter.   Irean Hong, NP

## 2022-03-30 ENCOUNTER — Other Ambulatory Visit: Payer: Self-pay | Admitting: *Deleted

## 2022-03-30 DIAGNOSIS — C9000 Multiple myeloma not having achieved remission: Secondary | ICD-10-CM

## 2022-03-30 MED ORDER — LENALIDOMIDE 20 MG PO CAPS: 20.0000 mg | ORAL_CAPSULE | Freq: Every day | ORAL | 0 refills | Status: DC

## 2022-04-01 ENCOUNTER — Encounter: Payer: Self-pay | Admitting: Oncology

## 2022-04-02 ENCOUNTER — Encounter: Payer: Self-pay | Admitting: *Deleted

## 2022-04-02 NOTE — Patient Instructions (Signed)
Per Billey Chang, NP patient needs referral to IR, Dr. Estanislado Pandy regarding pathologic fracture on MRI. Patient was seen by Dr. Rudene Christians in the past but he is no longer doing surgery. Referral printed and additional information faxed to IR at [phone]. MyChart message sent to patient with update.

## 2022-04-04 ENCOUNTER — Other Ambulatory Visit: Payer: Self-pay | Admitting: Oncology

## 2022-04-04 DIAGNOSIS — C9000 Multiple myeloma not having achieved remission: Secondary | ICD-10-CM

## 2022-04-06 ENCOUNTER — Telehealth (HOSPITAL_COMMUNITY): Payer: Self-pay

## 2022-04-06 ENCOUNTER — Encounter: Payer: Self-pay | Admitting: Oncology

## 2022-04-06 ENCOUNTER — Telehealth: Payer: Self-pay

## 2022-04-06 ENCOUNTER — Other Ambulatory Visit (HOSPITAL_COMMUNITY): Payer: Self-pay | Admitting: Interventional Radiology

## 2022-04-06 ENCOUNTER — Encounter: Payer: Self-pay | Admitting: Internal Medicine

## 2022-04-06 DIAGNOSIS — H353211 Exudative age-related macular degeneration, right eye, with active choroidal neovascularization: Secondary | ICD-10-CM | POA: Diagnosis not present

## 2022-04-06 DIAGNOSIS — H353221 Exudative age-related macular degeneration, left eye, with active choroidal neovascularization: Secondary | ICD-10-CM | POA: Diagnosis not present

## 2022-04-06 DIAGNOSIS — H353231 Exudative age-related macular degeneration, bilateral, with active choroidal neovascularization: Secondary | ICD-10-CM | POA: Diagnosis not present

## 2022-04-06 DIAGNOSIS — S22080A Wedge compression fracture of T11-T12 vertebra, initial encounter for closed fracture: Secondary | ICD-10-CM

## 2022-04-06 DIAGNOSIS — H43812 Vitreous degeneration, left eye: Secondary | ICD-10-CM | POA: Diagnosis not present

## 2022-04-06 NOTE — Telephone Encounter (Signed)
Wife of patient calling in regards to [place] trying to get an appt for back surgery. They have not heard from Dr Arlean Hopping office. Call back # [phone]

## 2022-04-06 NOTE — Telephone Encounter (Signed)
Called to inform pt that we are waiting on insurance to approve procedure. They would like to skip the consult. Will send a message to Dr. Saunders Revel to see about holing his Eliquis prior to KP/ablation. AW

## 2022-04-08 ENCOUNTER — Inpatient Hospital Stay: Payer: PPO

## 2022-04-08 ENCOUNTER — Inpatient Hospital Stay (HOSPITAL_BASED_OUTPATIENT_CLINIC_OR_DEPARTMENT_OTHER): Payer: PPO | Admitting: Oncology

## 2022-04-08 ENCOUNTER — Telehealth: Payer: Self-pay | Admitting: *Deleted

## 2022-04-08 ENCOUNTER — Encounter: Payer: Self-pay | Admitting: Oncology

## 2022-04-08 ENCOUNTER — Telehealth (HOSPITAL_COMMUNITY): Payer: Self-pay

## 2022-04-08 ENCOUNTER — Telehealth: Payer: Self-pay | Admitting: Internal Medicine

## 2022-04-08 VITALS — BP 138/75 | HR 43 | Temp 98.1°F | Resp 16 | Ht 68.0 in | Wt 175.0 lb

## 2022-04-08 DIAGNOSIS — C9000 Multiple myeloma not having achieved remission: Secondary | ICD-10-CM

## 2022-04-08 LAB — CBC WITH DIFFERENTIAL/PLATELET
Abs Immature Granulocytes: 0.04 10*3/uL (ref 0.00–0.07)
Basophils Absolute: 0 10*3/uL (ref 0.0–0.1)
Basophils Relative: 0 %
Eosinophils Absolute: 0 10*3/uL (ref 0.0–0.5)
Eosinophils Relative: 0 %
HCT: 37.1 % — ABNORMAL LOW (ref 39.0–52.0)
Hemoglobin: 12.1 g/dL — ABNORMAL LOW (ref 13.0–17.0)
Immature Granulocytes: 1 %
Lymphocytes Relative: 18 %
Lymphs Abs: 1.2 10*3/uL (ref 0.7–4.0)
MCH: 32.6 pg (ref 26.0–34.0)
MCHC: 32.6 g/dL (ref 30.0–36.0)
MCV: 100 fL (ref 80.0–100.0)
Monocytes Absolute: 1.3 10*3/uL — ABNORMAL HIGH (ref 0.1–1.0)
Monocytes Relative: 19 %
Neutro Abs: 4.1 10*3/uL (ref 1.7–7.7)
Neutrophils Relative %: 62 %
Platelets: 121 10*3/uL — ABNORMAL LOW (ref 150–400)
RBC: 3.71 MIL/uL — ABNORMAL LOW (ref 4.22–5.81)
RDW: 16.3 % — ABNORMAL HIGH (ref 11.5–15.5)
WBC: 6.6 10*3/uL (ref 4.0–10.5)
nRBC: 0 % (ref 0.0–0.2)

## 2022-04-08 LAB — COMPREHENSIVE METABOLIC PANEL
ALT: 13 U/L (ref 0–44)
AST: 17 U/L (ref 15–41)
Albumin: 3.7 g/dL (ref 3.5–5.0)
Alkaline Phosphatase: 57 U/L (ref 38–126)
Anion gap: 6 (ref 5–15)
BUN: 35 mg/dL — ABNORMAL HIGH (ref 8–23)
CO2: 25 mmol/L (ref 22–32)
Calcium: 9.3 mg/dL (ref 8.9–10.3)
Chloride: 109 mmol/L (ref 98–111)
Creatinine, Ser: 1.13 mg/dL (ref 0.61–1.24)
GFR, Estimated: >60 mL/min (ref >60)
Glucose, Bld: 111 mg/dL — ABNORMAL HIGH (ref 70–99)
Potassium: 4.2 mmol/L (ref 3.5–5.1)
Sodium: 140 mmol/L (ref 135–145)
Total Bilirubin: 0.5 mg/dL (ref 0.3–1.2)
Total Protein: 5.8 g/dL — ABNORMAL LOW (ref 6.5–8.1)

## 2022-04-08 MED ORDER — DENOSUMAB 120 MG/1.7ML ~~LOC~~ SOLN
120.0000 mg | Freq: Once | SUBCUTANEOUS | Status: AC
  Administered 2022-04-08: 120 mg via SUBCUTANEOUS
  Filled 2022-04-08: qty 1.7

## 2022-04-08 NOTE — Telephone Encounter (Signed)
Called pt's wife to let her know that we have not received insurance approval yet and they should continue Eliquis until we get approval. I have sent a STAT request but I'm still awaiting approval. I will call them to schedule as soon as I get auth. No answer, left vm to return call. AW

## 2022-04-08 NOTE — Progress Notes (Signed)
[place]  [phone] [phone]  ID: [name] OB: 12/07/1932  MR#: [id number]  NTI#:[id number]  Patient Care Team: [name], MD as PCP - General (Family Medicine) End, [name], MD as PCP - Cardiology (Cardiology) [name], MD (Endocrinology) Lloyd Huger, MD as Consulting Physician (Hematology and Oncology)   CHIEF COMPLAINT: Stage II kappa chain myeloma.  INTERVAL HISTORY: Patient returns to clinic today for repeat laboratory work, further evaluation, and continuation of Revlimid and Xgeva.  He continues to have significant back pain, but otherwise feels well. He has no neurologic complaints.  He denies any recent fevers or illnesses.  He has a good appetite and denies weight loss.  He has no chest pain, shortness of breath, cough, or hemoptysis.  He denies any nausea, vomiting, constipation, or diarrhea.  He has no urinary complaints.  Patient offers no further specific complaints today.    REVIEW OF SYSTEMS:   Review of Systems  Constitutional: Negative.  Negative for fever and malaise/fatigue.  Respiratory: Negative.  Negative for cough, hemoptysis and shortness of breath.   Cardiovascular: Negative.  Negative for chest pain and leg swelling.  Gastrointestinal: Negative.  Negative for abdominal pain.  Genitourinary: Negative.  Negative for dysuria and flank pain.  Musculoskeletal:  Positive for back pain. Negative for joint pain.  Skin: Negative.  Negative for rash.  Neurological: Negative.  Negative for dizziness, focal weakness, weakness and headaches.  Psychiatric/Behavioral: Negative.  The patient is not nervous/anxious.     As per HPI. Otherwise, a complete review of systems is negative.  PAST MEDICAL HISTORY: Past Medical History:  Diagnosis Date   Ascending aortic aneurysm ([place])    a. 12/2020 Echo: Asc Ao 70m, Ao root 488m   CKD (chronic kidney disease), stage III (HCC)    Diastolic  dysfunction    a. 12/2020 Echo: EF 50-55%, no rwma, mild LVH, GrI DD, nl RV fxn, mild AI. Asc Ao 4879mAo root 14m12m Elevated prostate specific antigen (PSA)    has been 7 for a year    GERD (gastroesophageal reflux disease)    History of colon polyps 2008   [facility] History of kidney stones    Hyperlipidemia    Hypertension    Myeloma (HCC)[place] PAF (paroxysmal atrial fibrillation) (HCC)[place] a. 01/2021-->Eliquis (CHA2DS2VASc = 3-4).   Prostate hypertrophy    diagnosed at age 38 d59 to hematospermia    PAST SURGICAL HISTORY: Past Surgical History:  Procedure Laterality Date   CATARACT EXTRACTION W/PHACO Left 01/10/2018   Procedure: CATARACT EXTRACTION PHACO AND INTRAOCULAR LENS PLACEMENT (IOC);  Surgeon: PorfBirder Robson;  Location: [facility] ORS;  Service: Ophthalmology;  Laterality: Left;  US 0[name]24.8 AP% 14.9 CDE 3.68 Fluid pack lot # [id number] CATARACT EXTRACTION W/PHACO Right 01/25/2018   Procedure: CATARACT EXTRACTION PHACO AND INTRAOCULAR LENS PLACEMENT (IOC);  Surgeon: PorfBirder Robson;  Location: [facility] ORS;  Service: Ophthalmology;  Laterality: Right;  US 0[name]42 AP% 10.8 CDE 4.59 Fluid pack lot # [id number] COLON SURGERY     CYSTOSCOPY W/ URETERAL STENT PLACEMENT Right 10/16/2017   Procedure: right  URETERAL STENT PLACEMENT,cystoscopy bilateral stent removal,rretrograde;  Surgeon: StoiAbbie Sons;  Location: [facility] ORS;  Service: Urology;  Laterality: Right;   CYSTOSCOPY/URETEROSCOPY/HOLMIUM LASER/STENT PLACEMENT Right 12/16/2020   Procedure: CYSTOSCOPY/URETEROSCOPY/HOLMIUM LASER/STENT PLACEMENT;  Surgeon: StoiAbbie Sons;  Location: [facility] ORS;  Service: Urology;  Laterality: Right;  EXTRACORPOREAL SHOCK WAVE LITHOTRIPSY Right 12/11/2020   Procedure: EXTRACORPOREAL SHOCK WAVE LITHOTRIPSY (ESWL);  Surgeon: Abbie Sons, MD;  Location: [facility] ORS;  Service: Urology;  Laterality: Right;   IR KYPHO EA ADDL LEVEL THORACIC OR LUMBAR  02/02/2021   IR KYPHO LUMBAR  INC FX REDUCE BONE BX UNI/BIL CANNULATION INC/IMAGING  02/02/2021   KIDNEY STONE SURGERY     KYPHOPLASTY N/A 03/12/2021   Procedure: Hewitt Shorts, L3;  Surgeon: Hessie Knows, MD;  Location: [facility] ORS;  Service: Orthopedics;  Laterality: N/A;   RESECTION SOFT TISSUE TUMOR LEG / ANKLE RADICAL  jan 2009   [facility],  right thigh/knee , nonmalignant   SMALL INTESTINE SURGERY  1946   implaed on picket fence, punctured stomach   TONSILLECTOMY      FAMILY HISTORY: Family History  Problem Relation Age of Onset   Hypertension Father    Hyperlipidemia Father    Cancer Sister        thyroid - dx in late 20's    ADVANCED DIRECTIVES (Y/N):  N  HEALTH MAINTENANCE: Social History   Tobacco Use   Smoking status: Former    Types: Cigarettes    Quit date: 07/05/1965    Years since quitting: 56.8   Smokeless tobacco: Never  Vaping Use   Vaping Use: Never used  Substance Use Topics   Alcohol use: Yes    Alcohol/week: 1.0 standard drink of alcohol    Types: 1 Standard drinks or equivalent per week    Comment: occassionaly   Drug use: No     Colonoscopy:  PAP:  Bone density:  Lipid panel:  Allergies  Allergen Reactions   Quinolones     Aortic dilation contraindicates FQ   Amlodipine Other (See Comments)    LE edema   Azithromycin Nausea And Vomiting   Lisinopril Cough   Morphine And Related Nausea And Vomiting    Current Outpatient Medications  Medication Sig Dispense Refill   Calcium Carb-Cholecalciferol (OYSTER SHELL CALCIUM W/D) 500-5 MG-MCG TABS Take by mouth.     Calcium Carbonate-Vit D-Min (CALCIUM 1200 PO) Take by mouth.     carvedilol (COREG) 25 MG tablet Take 1 tablet (25 mg total) by mouth 2 (two) times daily with a meal. 60 tablet 1   dexamethasone (DECADRON) 4 MG tablet TAKE 5 TABLETS BY MOUTH ONE TIME PER WEEK 20 tablet 4   feeding supplement (ENSURE ENLIVE / ENSURE PLUS) LIQD Take 237 mLs by mouth 3 (three) times daily between meals. 237 mL 12   gabapentin  (NEURONTIN) 300 MG capsule TAKE 2 CAPSULES BY MOUTH 3 TIMES DAILY. 180 capsule 2   ibuprofen (ADVIL) 200 MG tablet Take 200 mg by mouth every 6 (six) hours as needed (1-2 tablets q6h prn).     lenalidomide (REVLIMID) 20 MG capsule Take 1 capsule (20 mg total) by mouth daily. Take for 21 days, then hold for 7 days. Repeat every 28 days 21 capsule 0   morphine (MS CONTIN) 15 MG 12 hr tablet Take 1 tablet (15 mg total) by mouth every 12 (twelve) hours. 60 tablet 0   morphine (MS CONTIN) 30 MG 12 hr tablet Take 1 tablet (30 mg total) by mouth every 12 (twelve) hours. 60 tablet 0   Multiple Vitamin (MULTIVITAMIN WITH MINERALS) TABS tablet Take 1 tablet by mouth daily.     Multiple Vitamins-Minerals (PRESERVISION AREDS PO) Take by mouth.     polyethylene glycol (MIRALAX / GLYCOLAX) 17 g packet Take 17 g by mouth  2 (two) times daily.  0   potassium citrate (UROCIT-K) 10 MEQ (1080 MG) SR tablet TAKE 1 TABLET (10 MEQ TOTAL) BY MOUTH 3 (THREE) TIMES DAILY WITH MEALS. 270 tablet 2   senna-docusate (SENOKOT-S) 8.6-50 MG tablet Take 1 tablet by mouth 2 (two) times daily. 30 tablet 0   tamsulosin (FLOMAX) 0.4 MG CAPS capsule TAKE 1 CAPSULE BY MOUTH EVERY DAY 90 capsule 1   ELIQUIS 5 MG TABS tablet TAKE 1 TABLET BY MOUTH TWICE A DAY (Patient not taking: Reported on 04/08/2022) 180 tablet 0   fluticasone (FLONASE) 50 MCG/ACT nasal spray Place into both nostrils daily. (Patient not taking: Reported on 02/09/2022)     lidocaine (XYLOCAINE) 5 % ointment Apply topically 3 (three) times daily as needed for mild pain or moderate pain. (Patient not taking: Reported on 02/09/2022) 35.44 g 0   naloxone (NARCAN) nasal spray 4 mg/0.1 mL SPRAY 1 SPRAY INTO ONE NOSTRIL AS DIRECTED FOR OPIOID OVERDOSE (TURN PERSON ON SIDE AFTER DOSE. IF NO RESPONSE IN 2-3 MINUTES OR PERSON RESPONDS BUT RELAPSES, REPEAT USING A NEW SPRAY DEVICE AND SPRAY INTO THE OTHER NOSTRIL. CALL 911 AFTER USE.) * EMERGENCY USE ONLY * (Patient not taking: Reported  on 02/09/2022) 1 each 0   ondansetron (ZOFRAN) 8 MG tablet Take 1 tablet (8 mg total) by mouth every 8 (eight) hours as needed for nausea or vomiting. (Patient not taking: Reported on 02/09/2022) 60 tablet 2   oxyCODONE (OXY IR/ROXICODONE) 5 MG immediate release tablet Take 1-2 tablets (5-10 mg total) by mouth every 4 (four) hours as needed for severe pain. (Patient not taking: Reported on 02/09/2022) 90 tablet 0   No current facility-administered medications for this visit.    OBJECTIVE: Vitals:   04/08/22 1511  BP: 138/75  Pulse: (!) 43  Resp: 16  Temp: 98.1 F (36.7 C)  SpO2: 99%      Body mass index is 26.61 kg/m.    ECOG FS:1 - Symptomatic but completely ambulatory  General: Well-developed, well-nourished, no acute distress. Eyes: Pink conjunctiva, anicteric sclera. HEENT: Normocephalic, moist mucous membranes. Lungs: No audible wheezing or coughing. Heart: Regular rate and rhythm. Abdomen: Soft, nontender, no obvious distention. Musculoskeletal: No edema, cyanosis, or clubbing. Neuro: Alert, answering all questions appropriately. Cranial nerves grossly intact. Skin: No rashes or petechiae noted. Psych: Normal affect.  LAB RESULTS:  Lab Results  Component Value Date   NA 140 04/08/2022   K 4.2 04/08/2022   CL 109 04/08/2022   CO2 25 04/08/2022   GLUCOSE 111 (H) 04/08/2022   BUN 35 (H) 04/08/2022   CREATININE 1.13 04/08/2022   CALCIUM 9.3 04/08/2022   PROT 5.8 (L) 04/08/2022   ALBUMIN 3.7 04/08/2022   AST 17 04/08/2022   ALT 13 04/08/2022   ALKPHOS 57 04/08/2022   BILITOT 0.5 04/08/2022   GFRNONAA >60 04/08/2022   GFRAA 29 (L) 10/16/2017    Lab Results  Component Value Date   WBC 6.6 04/08/2022   NEUTROABS 4.1 04/08/2022   HGB 12.1 (L) 04/08/2022   HCT 37.1 (L) 04/08/2022   MCV 100.0 04/08/2022   PLT 121 (L) 04/08/2022     STUDIES: MR Lumbar Spine W Wo Contrast  Result Date: 03/26/2022 CLINICAL DATA:  Low back pain. EXAM: MRI LUMBAR SPINE WITHOUT  AND WITH CONTRAST TECHNIQUE: Multiplanar and multiecho pulse sequences of the lumbar spine were obtained without and with intravenous contrast. CONTRAST:  7.75m GADAVIST GADOBUTROL 1 MMOL/ML IV SOLN COMPARISON:  03/27/2021 FINDINGS: Segmentation:  Standard.  Alignment:  Physiologic. Vertebrae: Incompletely visualized subacute T11 vertebral body compression fracture better evaluated on the MRI thoracic spine performed earlier same day and reported separately. Chronic T12, L1, L2 and L3 vertebral body compression fractures with prior augmentation. 4 mm retropulsion of the superior posterior margin of the T12 vertebral body mildly impressing on the thecal sac without spinal stenosis. 3 mm retropulsion of the superior posterior margin of the L1 and L3 vertebral bodies mildly impressing upon the thecal sac. Chronic L4 vertebral body compression fracture. Conus medullaris and cauda equina: Conus extends to the L1 level. Conus and cauda equina appear normal. Paraspinal and other soft tissues: No acute paraspinal abnormality. Disc levels: Disc spaces: Disc desiccation throughout the lumbar spine. T12-L1: No disc protrusion. No spinal stenosis. Mild bilateral foraminal stenosis. L1-L2: No significant disc bulge. No neural foraminal stenosis. No central canal stenosis. L2-L3: No significant disc protrusion. Mild left foraminal stenosis. No right foraminal stenosis. No spinal stenosis. L3-L4: Mild broad-based disc bulge. Mild bilateral facet arthropathy. No foraminal or central canal stenosis. L4-L5: Mild broad-based disc bulge. Mild bilateral facet arthropathy. No foraminal or central canal stenosis. L5-S1: No significant disc bulge. No neural foraminal stenosis. No central canal stenosis. IMPRESSION: 1. No acute osseous injury of the lumbar spine. 2. Incompletely visualized subacute T11 vertebral body compression fracture better evaluated on the MRI thoracic spine performed earlier same day and reported separately. 3. Chronic  T12, L1, L2 and L3 vertebral body compression fractures with prior augmentation. Mild thoracolumbar spine spondylosis. Electronically Signed   By: Kathreen Devoid M.D.   On: 03/26/2022 15:07   MR Thoracic Spine W Wo Contrast  Result Date: 03/26/2022 CLINICAL DATA:  Mid to low back pain. No known injury. History of multiple myeloma on therapy EXAM: MRI THORACIC WITHOUT AND WITH CONTRAST TECHNIQUE: Multiplanar and multiecho pulse sequences of the thoracic spine were obtained without and with intravenous contrast. CONTRAST:  7.53m GADAVIST GADOBUTROL 1 MMOL/ML IV SOLN COMPARISON:  MRI 03/28/2021.  CT 03/22/2022 FINDINGS: Alignment:  Physiologic. Vertebrae: Chronic compression fractures of T12 and L1 status post cement augmentation. Subacute fracture of the T11 vertebral body with approximately 30% vertebral body height loss. Mild residual bone marrow edema along the fracture margin. No bony retropulsion. No fracture extension into the posterior elements. Small rounded enhancing lesion within the anterior aspect of the T11 vertebral body (series 31, image 9) compatible with a pathologic fracture. Additional enhancing lesions within the T8 and T9 vertebral bodies. Overall size and number of bone lesions has significantly decreased when compared with the previous MRI from 2022. No additional acute fractures. No evidence of discitis. Cord:  No focal cord lesion.  No epidural space mass. Paraspinal and other soft tissues: Known ascending thoracic aortic aneurysm is suboptimally assessed by protocol. Paraspinal soft tissues otherwise within normal limits. Disc levels: Similar mild degenerative changes throughout the thoracic spine. No canal stenosis at any level. There is bilateral foraminal narrowing at T10-11 and T11-12. IMPRESSION: 1. Subacute pathologic fracture of the T11 vertebral body with approximately 30% vertebral body height loss where there is a small enhancing lesion present within the vertebral body  anteriorly. 2. Additional enhancing lesions within the T8 and T9 vertebral bodies. Overall, size and number of bone lesions has significantly decreased when compared with the previous MRI from 2022. Findings compatible with a favorable response to treatment. 3. Chronic compression fractures of T12 and L1 status post cement augmentation. 4. Similar mild degenerative changes throughout the thoracic spine with mild bilateral foraminal  narrowing at T10-11 and T11-12. No canal stenosis at any level. 5. Known ascending thoracic aortic aneurysm is suboptimally assessed by protocol. Electronically Signed   By: Davina Poke D.O.   On: 03/26/2022 14:49   CT ANGIO CHEST AORTA W/CM & OR WO/CM  Result Date: 03/22/2022 CLINICAL DATA:  No surgery. NKI. Eval aneurysm per MD. No current symptoms per pt. History of light chain myeloma. EXAM: CT ANGIOGRAPHY CHEST WITH CONTRAST TECHNIQUE: Multidetector CT imaging of the chest was performed using the standard protocol during bolus administration of intravenous contrast. Multiplanar CT image reconstructions and MIPs were obtained to evaluate the vascular anatomy. RADIATION DOSE REDUCTION: This exam was performed according to the departmental dose-optimization program which includes automated exposure control, adjustment of the mA and/or kV according to patient size and/or use of iterative reconstruction technique. CONTRAST:  57m OMNIPAQUE IOHEXOL 350 MG/ML SOLN COMPARISON:  11/21/2017.  PET-CT, 12/08/2021. FINDINGS: Cardiovascular: Ascending thoracic aorta is dilated to 4.6 cm, stable from 2019 allowing for slight differences in measurement technique. Mild aortic atherosclerosis. No dissection. Arch branch vessels are widely patent. Heart is normal in size and configuration. Three-vessel coronary artery calcifications. No pericardial effusion. Mediastinum/Nodes: No enlarged mediastinal, hilar, or axillary lymph nodes. Thyroid gland, trachea, and esophagus demonstrate no  significant findings. Lungs/Pleura: Minor areas of subsegmental atelectasis. Lungs otherwise clear. No pleural effusion or pneumothorax. Upper Abdomen: No acute abnormality. Musculoskeletal: Skeletal demineralization. There is a coarsened trabecula with intervening lucencies. Lower thoracic spine and upper lumbar spine fractures, T12, L1 and L2 treated with previous vertebroplasty. No acute findings. Review of the MIP images confirms the above findings. IMPRESSION: 1. Ascending thoracic aorta dilated to 4.6 cm. Ascending thoracic aortic aneurysm. Recommend semi-annual imaging followup by CTA or MRA and referral to cardiothoracic surgery if not already obtained. This recommendation follows 2010 ACCF/AHA/AATS/ACR/ASA/SCA/SCAI/SIR/STS/SVM Guidelines for the Diagnosis and Management of Patients With Thoracic Aortic Disease. Circulation. 2010; 121:: C127-N170 Aortic aneurysm NOS (ICD10-I71.9) 2. No acute findings in the chest. 3. Skeletal changes consistent with the patient's known myeloma. Chronic lower thoracic and upper lumbar spine vertebral fractures. Aortic Atherosclerosis (ICD10-I70.0). Electronically Signed   By: DLajean ManesM.D.   On: 03/22/2022 15:51    ASSESSMENT: Stage II Kappa chain myeloma.  PLAN:    Stage II kappa chain myeloma: (11:14 translocation, high risk) SPEP essentially negative and immunoglobulins are within normal limits.  Patient's kappa light chains have improved to greater than 5400 down to 46.2, today's results are pending.  Cycle 1 included Velcade and Revlimid, but given his declining performance status and difficulty to get into clinic this was transitioned to Revlimid only.  Given persistent neutropenia, Revlimid was previously dose reduced to 15 mg daily, but patient is now tolerating an increased dose of 20 mg for 21 days with 7 days off.   He is also taking 20 mg dexamethasone weekly.  PET scan results from December 08, 2021 reviewed independently with no obvious evidence of  progressive disease.  Patient initiated monthly Xgeva on August 11, 2021.  Proceed with Xgeva today.  Continue 20 mg Revlimid daily as above.  Return to clinic in 4 weeks for further evaluation and continuation of treatment.  Appreciate clinical pharmacy input.   Back pain: Chronic and unchanged.  PET scan results as above.  MRI results reviewed independently.  Patient has a referral to IR for consideration of kyphoplasty.  Continue current narcotic and gabapentin prescriptions.   Anemia: Essentially resolved. Right flank mass: MRI results from July 13, 2021  reviewed independently with no obvious pathology on his right flank, but did reveal several fractured ribs.  Continue narcotic regimen as above. Bone lesions: Continue Xgeva as above. Neutropenia: Resolved.  Continue Revlimid 20 mg as above. Thrombocytopenia: Platelet count continues to improve and is now 121.   Patient expressed understanding and was in agreement with this plan. He also understands that He can call clinic at any time with any questions, concerns, or complaints.    Cancer Staging  Kappa light chain myeloma ([place]) Staging form: Plasma Cell Myeloma and Plasma Cell Disorders, AJCC 8th Edition - Clinical stage from 04/16/2021: Albumin (g/dL): 3.8, ISS: Stage II, High-risk cytogenetics: Absent, LDH: Unknown - Signed by Lloyd Huger, MD on 04/16/2021 Albumin range (g/dL): Greater than or equal to 3.5 Cytogenetics: t(11;14) translocation Serum calcium level: Normal Serum creatinine level: Normal Bone disease on imaging: Present  Lloyd Huger, MD   04/09/2022 4:02 PM

## 2022-04-08 NOTE — Telephone Encounter (Signed)
   Pre-operative Risk Assessment    Patient Name: Jack [name]  DOB: 03-14-1933 MRN: [id number]      Request for Surgical Clearance    Procedure:   T11 KP w/ ablation   Date of Surgery:  Clearance TBD                                 Surgeon:  Dr Luanne Bras Surgeon's Group or Practice Name:  [facility] Phone number:  [phone] Fax number:  [phone]   Type of Clearance Requested:   - Pharmacy:  Hold Apixaban (Eliquis) hold 48 hours prior    Type of Anesthesia:  Not Indicated   Additional requests/questions:    [name]   04/08/2022, 2:52 PM

## 2022-04-08 NOTE — Telephone Encounter (Signed)
Call to [facility], Dr. Dion Saucier clinic to follow up on patient concerns. Patient is currently holding Eliquis per wife at clinic visit today in preparation for procedure to be scheduled. Per Cardiology note, Dr. Saunders Revel recommends holding 48 hours prior to procedure. Call placed to office of Dr. Estanislado Pandy to have them reach out to patient to confirm any information regarding dates for procedures and additional information in regards to Eliquis.

## 2022-04-09 ENCOUNTER — Encounter: Payer: Self-pay | Admitting: Oncology

## 2022-04-09 ENCOUNTER — Encounter: Payer: Self-pay | Admitting: Internal Medicine

## 2022-04-09 DIAGNOSIS — C9000 Multiple myeloma not having achieved remission: Secondary | ICD-10-CM | POA: Diagnosis not present

## 2022-04-09 DIAGNOSIS — S22000A Wedge compression fracture of unspecified thoracic vertebra, initial encounter for closed fracture: Secondary | ICD-10-CM | POA: Diagnosis not present

## 2022-04-09 DIAGNOSIS — J9601 Acute respiratory failure with hypoxia: Secondary | ICD-10-CM | POA: Diagnosis not present

## 2022-04-09 DIAGNOSIS — M8440XA Pathological fracture, unspecified site, initial encounter for fracture: Secondary | ICD-10-CM | POA: Diagnosis not present

## 2022-04-09 LAB — KAPPA/LAMBDA LIGHT CHAINS
Kappa free light chain: 42.4 mg/L — ABNORMAL HIGH (ref 3.3–19.4)
Kappa, lambda light chain ratio: 4.28 — ABNORMAL HIGH (ref 0.26–1.65)
Lambda free light chains: 9.9 mg/L (ref 5.7–26.3)

## 2022-04-09 NOTE — Telephone Encounter (Signed)
Patient with diagnosis of afib on Eliquis for anticoagulation.    Procedure: T11 KP w/ ablation  Date of procedure: TBD  CHA2DS2-VASc Score = 4  This indicates a 4.8% annual risk of stroke. The patient's score is based upon: CHF History: 0 HTN History: 1 Diabetes History: 0 Stroke History: 0 Vascular Disease History: 1 Age Score: 2 Gender Score: 0   CrCl 35m/min Platelet count 121K  Would clarify that pt is still taking Eliquis. Med was marked not taking at his oncology appt yesterday. Per office protocol, patient can hold Eliquis for 3 days prior to procedure.    **This guidance is not considered finalized until pre-operative APP has relayed final recommendations.**

## 2022-04-09 NOTE — Telephone Encounter (Signed)
   Patient Name: Jack [name]  DOB: Jun 26, 1933 MRN: [id number]  Primary Cardiologist: Nelva Bush, MD  Clinical pharmacist have reviewed the patient's past medical history, labs, and current medications as part of pre-operative protocol coverage.   The following recommendations have been made:  Patient with diagnosis of afib on Eliquis for anticoagulation.     Procedure: T11 KP w/ ablation  Date of procedure: TBD   CHA2DS2-VASc Score = 4  This indicates a 4.8% annual risk of stroke. The patient's score is based upon: CHF History: 0 HTN History: 1 Diabetes History: 0 Stroke History: 0 Vascular Disease History: 1 Age Score: 2 Gender Score: 0   CrCl 19m/min Platelet count 121K   Per office protocol, patient can hold Eliquis for 3 days prior to procedure. Please resume Eliquis as soon as possible postprocedure, at the discretion of the surgeon.   I will route this recommendation to the requesting party via Epic fax function and remove from pre-op pool.  Please call with questions.  ELenna Sciara NP 04/09/2022, 12:19 PM

## 2022-04-12 LAB — IMMUNOGLOBULINS A/E/G/M, SERUM
IgA: 130 mg/dL (ref 61–437)
IgE (Immunoglobulin E), Serum: <2 IU/mL — ABNORMAL LOW (ref 6–495)
IgG (Immunoglobin G), Serum: 329 mg/dL — ABNORMAL LOW (ref 603–1613)
IgM (Immunoglobulin M), Srm: 23 mg/dL (ref 15–143)

## 2022-04-13 ENCOUNTER — Other Ambulatory Visit (HOSPITAL_COMMUNITY): Payer: Self-pay | Admitting: Student

## 2022-04-13 ENCOUNTER — Other Ambulatory Visit: Payer: PPO | Admitting: Nurse Practitioner

## 2022-04-13 ENCOUNTER — Encounter: Payer: Self-pay | Admitting: Nurse Practitioner

## 2022-04-13 DIAGNOSIS — C9000 Multiple myeloma not having achieved remission: Secondary | ICD-10-CM

## 2022-04-13 DIAGNOSIS — R630 Anorexia: Secondary | ICD-10-CM

## 2022-04-13 DIAGNOSIS — Z515 Encounter for palliative care: Secondary | ICD-10-CM

## 2022-04-13 DIAGNOSIS — M549 Dorsalgia, unspecified: Secondary | ICD-10-CM

## 2022-04-13 DIAGNOSIS — R531 Weakness: Secondary | ICD-10-CM

## 2022-04-13 DIAGNOSIS — G8929 Other chronic pain: Secondary | ICD-10-CM

## 2022-04-13 NOTE — Progress Notes (Signed)
[facility] Consult Note Telephone: [phone]  Fax: [phone]    Date of encounter: 04/13/22 1:39 PM PATIENT NAME: Jack [name] [address] [place] [address]   [phone] (home)  DOB: 1933-05-29 MRN: [id number] PRIMARY CARE PROVIDER:    [name], MD,  [place] [id number] [phone]  RESPONSIBLE PARTY:    Contact Information       Name Relation Home Work [place] Spouse [phone]   [phone]         Due to the COVID-19 crisis, this visit was done via telemedicine from my office and it was initiated and consent by this patient and or family.   I connected with Jack [name] and Jack [name] on 03/17/22 by a telephone as video not available enabled telemedicine application and verified that I am speaking with the correct person.   I discussed the limitations of evaluation and management by telemedicine. The patient expressed understanding and agreed to proceed. Palliative Care was asked to follow this patient by consultation request of  Jack Pitch, MD to address advance care planning and complex medical decision making. This is a follow up visit.                                  ASSESSMENT AND PLAN / RECOMMENDATIONS:  Symptom Management/Plan: Symptom Management/Plan: 1. Advance Care Planning; DNR; Currently taking oral chemotherapy, doing well. Wishes to continue    2. Chronic pain secondary to compression fx's, multiple myeloma with pending Kypoplasty; continue with MsContin 45 mg bid; Oxycodone 54m take 1 to 2 tabs q 4 hrs for severe pain Gabapentin 600 mg tid   3. Anorexia; varies, talked about weight gain, nutrition; supplements;  11/17/2021 weight 165.3 lbs 03/11/2022 weight 175 lbs\BMI 26.73 04/08/2022 weight 174 lbs/BMI 26.61  03/11/2022 labs reviewed; albumin 3.3; total protein 5.5 04/08/2022 labs reviewed albumin 3.7; total protein 5.8   MRI  thoracic 03/25/2022 IMPRESSION: 1. Subacute pathologic fracture of the T11 vertebral body with approximately 30% vertebral body height loss where there is a small enhancing lesion present within the vertebral body anteriorly. 2. Additional enhancing lesions within the T8 and T9 vertebral bodies. Overall, size and number of bone lesions has significantly decreased when compared with the previous MRI from 2022. Findings compatible with a favorable response to treatment. 3. Chronic compression fractures of T12 and L1 status post cement augmentation. 4. Similar mild degenerative changes throughout the thoracic spine with mild bilateral foraminal narrowing at T10-11 and T11-12. No canal stenosis at any level. 5. Known ascending thoracic aortic aneurysm is suboptimally assessed by protocol.  MRI L-S Spine 03/25/2022  IMPRESSION: 1. No acute osseous injury of the lumbar spine. 2. Incompletely visualized subacute T11 vertebral body compression fracture better evaluated on the MRI thoracic spine performed earlier same day and reported separately. 3. Chronic T12, L1, L2 and L3 vertebral body compression fractures with prior augmentation. Mild thoracolumbar spine spondylosis.  4. Palliative care encounter; Palliative care encounter; Palliative medicine team will continue to support patient, patient's family, and medical team. Visit consisted of counseling and education dealing with the complex and emotionally intense issues of symptom management and palliative care in the setting of serious and potentially life-threatening illness   Cancer Staging  Kappa light chain myeloma ([place] Staging form: Plasma Cell Myeloma and Plasma Cell Disorders, AJCC 8th Edition - Clinical  stage from 04/16/2021: Albumin (g/dL): 3.8, ISS: Stage II, High-risk cytogenetics: Absent, LDH: Unknown - Signed by Jack Huger, MD on 04/16/2021 Albumin range (g/dL): Greater than or equal to 3.5 Cytogenetics: t(11;14)  translocation Serum calcium level: Normal Serum creatinine level: Normal Bone disease on imaging: Present   Noted:  Low normal heart squeezing function.  Mildly leaky mitral valve.  Mild to moderately leaky aortic valve. Dilated aorta with increase in size to 51 mm of the ascending aorta.  Aortic root measures 40 mm while the aortic arch measures 46 mm with increase in size of the ascending aorta, Will have CT angiogram of the chest to better evaluate his aorta.  Kidney function was stable on May 30.  Pending CTA scheduled, and pending appointment with thoracic surgery to establish care following CTA.   Follow up Palliative Care Visit: Palliative care will continue to follow for complex medical decision making, advance care planning, and clarification of goals. Return 8 weeks or prn.   I spent 35 minutes providing this consultation. More than 50% of the time in this consultation was spent in counseling and care coordination. PPS: 50% Chief Complaint: Follow up palliative consult for complex medical decision making, address goals, manage ongoing symptoms   HISTORY OF PRESENT ILLNESS:  Jack [name] is a 88 y.o. year old male  with multiple medical problems including hypertension, CKD stage IIIb, A. fib on Eliquis, and anemia.     Hospitalized 01/02/2021 to 01/07/2021 with T12 compression fracture with recommendation for conservative management including use of a TLSO brace.    Hospitalized 01/29/2021 to 02/07/2021 with recurrent back pain and MR showing T12 and new L1 compression fracture. Underwent T12/L1 kyphoplasty on 02/02/2021. Suspicion of a left fifth rib fracture, which occurred while transitioning from the stretcher for his kyphoplasty.  [name] had mild hypercalcemia with SPEP and UPEP concerning for possible myeloma.     Hospitalized 03/11/2021 - 03/17/2021 with worsening back pain, CT revealed new compression fractures of L2 and L3, underwent kyphoplasty on 6/30.    Hospitalized  03/26/2021-04/07/2021 with severe back/flank pain, MRI revealed T10 pathologic compression fracture, started on treatment for multiple myeloma. Last Oncology visit 11/17/2021 with Dr Jack [name] receive Delton See with weekly dexamethasone, plan to return to clinic for labs, PET scan then 4 weeks for further evaluation. Back pain with current narcotic with gabapentin. Right flank mass: MRI results from July 13, 2021 reviewed independently with no obvious pathology on his right flank, but did reveal several fractured ribs.   I called Mr and Jack [name] Ballon for t/u PC visit telemedicine telephonic as video not available. Mr and Jack [name] Agner in agreement. I talked about Mr Quast about how Mr Pavlik has been feeling. Mr Cislo endorses he has been recliner dependent, taking some steps but MRI results with compression fx's with pending Kyphoplasty in 2 days. We talked about how he pain current regimen with recent increase in Ms Contin 28m q12 hrs; Oxycodone IR 559m([id number] as needed; gabapentin 600m23md. We talked about overall functional ability has been limited but hope following Kyphoplasty will improve back to baseline. We talked about appetite has been somewhat declined with increase in pain though hoping improvement of pain with Kyphoplasty. Discussed saw Dr FinnGrayland Ormondologist 04/08/2022, proceeded with XegvAaron Edelmanntinued with Revlimid 20mg26mu 4 weeks. We talked about upcoming appointment with Dr HendrWallene Huhaneurysm. (Ascending aortic aneurysm 12/2020). We talked at length about plavix causing bruising to bilateral arms with wounds which have cleared up  since plavix has been on hold for pending Kyphoplasty. We talked at length about plavix with recent Cardiology visit with  Mr. Sharolyn Douglas NP Cardiology. Jack [name] Zuckerman endorses they were told Mr. Biernat would not be able to stop Plavix due to increase risk. Mr Gauthreaux endorses he was on a statin but not able to tolerate, tried 2 different medications causing increase in leg pain. We  talked about further questions to ask. We talked about appetite, ros.    Medical goals reviewed. Talked about f/u PC, scheduled visit. Therapeutic listening, emotional support provided. Questions answered.    History obtained from review of EMR, discussion with Jack [name] and Mr. Varnell.  I reviewed available labs, medications, imaging, studies and related documents from the EMR.  Records reviewed and summarized above.    ROS 10 point reviewed all negative except HPI   Physical Exam: deferred   Thank you for the opportunity to participate in the care of Mr. Ramone.  The palliative care team will continue to follow. Please call our office at [phone] if we can be of additional assistance.   Darriona Dehaas Ihor Gully, NP

## 2022-04-14 ENCOUNTER — Other Ambulatory Visit: Payer: Self-pay | Admitting: Radiology

## 2022-04-15 ENCOUNTER — Ambulatory Visit (HOSPITAL_COMMUNITY)
Admission: RE | Admit: 2022-04-15 | Discharge: 2022-04-15 | Disposition: A | Discharge status: Home / Self Care | Payer: PPO | Source: Ambulatory Visit | Attending: Interventional Radiology | Admitting: Interventional Radiology

## 2022-04-15 ENCOUNTER — Other Ambulatory Visit (HOSPITAL_COMMUNITY): Payer: Self-pay | Admitting: Interventional Radiology

## 2022-04-15 ENCOUNTER — Other Ambulatory Visit: Payer: Self-pay

## 2022-04-15 ENCOUNTER — Encounter (HOSPITAL_COMMUNITY): Payer: Self-pay

## 2022-04-15 DIAGNOSIS — S22080A Wedge compression fracture of T11-T12 vertebra, initial encounter for closed fracture: Secondary | ICD-10-CM | POA: Diagnosis not present

## 2022-04-15 DIAGNOSIS — I480 Paroxysmal atrial fibrillation: Secondary | ICD-10-CM | POA: Diagnosis not present

## 2022-04-15 DIAGNOSIS — C9000 Multiple myeloma not having achieved remission: Secondary | ICD-10-CM | POA: Diagnosis not present

## 2022-04-15 DIAGNOSIS — I7121 Aneurysm of the ascending aorta, without rupture: Secondary | ICD-10-CM | POA: Diagnosis not present

## 2022-04-15 DIAGNOSIS — I129 Hypertensive chronic kidney disease with stage 1 through stage 4 chronic kidney disease, or unspecified chronic kidney disease: Secondary | ICD-10-CM | POA: Diagnosis not present

## 2022-04-15 DIAGNOSIS — Z7901 Long term (current) use of anticoagulants: Secondary | ICD-10-CM | POA: Diagnosis not present

## 2022-04-15 DIAGNOSIS — K219 Gastro-esophageal reflux disease without esophagitis: Secondary | ICD-10-CM | POA: Diagnosis not present

## 2022-04-15 DIAGNOSIS — M8458XA Pathological fracture in neoplastic disease, other specified site, initial encounter for fracture: Secondary | ICD-10-CM | POA: Diagnosis not present

## 2022-04-15 DIAGNOSIS — X58XXXA Exposure to other specified factors, initial encounter: Secondary | ICD-10-CM | POA: Diagnosis not present

## 2022-04-15 DIAGNOSIS — Z87891 Personal history of nicotine dependence: Secondary | ICD-10-CM | POA: Diagnosis not present

## 2022-04-15 DIAGNOSIS — N183 Chronic kidney disease, stage 3 unspecified: Secondary | ICD-10-CM | POA: Diagnosis not present

## 2022-04-15 HISTORY — PX: IR KYPHO THORACIC WITH BONE BIOPSY: IMG5518

## 2022-04-15 LAB — BASIC METABOLIC PANEL
Anion gap: 10 (ref 5–15)
BUN: 34 mg/dL — ABNORMAL HIGH (ref 8–23)
CO2: 23 mmol/L (ref 22–32)
Calcium: 9.2 mg/dL (ref 8.9–10.3)
Chloride: 104 mmol/L (ref 98–111)
Creatinine, Ser: 1.35 mg/dL — ABNORMAL HIGH (ref 0.61–1.24)
GFR, Estimated: 51 mL/min — ABNORMAL LOW (ref >60)
Glucose, Bld: 123 mg/dL — ABNORMAL HIGH (ref 70–99)
Potassium: 4.8 mmol/L (ref 3.5–5.1)
Sodium: 137 mmol/L (ref 135–145)

## 2022-04-15 LAB — CBC WITH DIFFERENTIAL/PLATELET
Abs Immature Granulocytes: 0.08 10*3/uL — ABNORMAL HIGH (ref 0.00–0.07)
Basophils Absolute: 0 10*3/uL (ref 0.0–0.1)
Basophils Relative: 0 %
Eosinophils Absolute: 0 10*3/uL (ref 0.0–0.5)
Eosinophils Relative: 0 %
HCT: 42 % (ref 39.0–52.0)
Hemoglobin: 13.5 g/dL (ref 13.0–17.0)
Immature Granulocytes: 1 %
Lymphocytes Relative: 12 %
Lymphs Abs: 1.1 10*3/uL (ref 0.7–4.0)
MCH: 32.3 pg (ref 26.0–34.0)
MCHC: 32.1 g/dL (ref 30.0–36.0)
MCV: 100.5 fL — ABNORMAL HIGH (ref 80.0–100.0)
Monocytes Absolute: 0.5 10*3/uL (ref 0.1–1.0)
Monocytes Relative: 6 %
Neutro Abs: 7 10*3/uL (ref 1.7–7.7)
Neutrophils Relative %: 81 %
Platelets: 128 10*3/uL — ABNORMAL LOW (ref 150–400)
RBC: 4.18 MIL/uL — ABNORMAL LOW (ref 4.22–5.81)
RDW: 16 % — ABNORMAL HIGH (ref 11.5–15.5)
WBC: 8.6 10*3/uL (ref 4.0–10.5)
nRBC: 0 % (ref 0.0–0.2)

## 2022-04-15 LAB — PROTIME-INR
INR: 1.1 (ref 0.8–1.2)
Prothrombin Time: 14.4 seconds (ref 11.4–15.2)

## 2022-04-15 MED ORDER — SODIUM CHLORIDE 0.9 % IV SOLN: INTRAVENOUS | Status: AC

## 2022-04-15 MED ORDER — SODIUM CHLORIDE 0.9 % IV SOLN: INTRAVENOUS | Status: DC

## 2022-04-15 MED ORDER — FENTANYL CITRATE (PF) 100 MCG/2ML IJ SOLN
INTRAMUSCULAR | Status: AC | PRN
  Administered 2022-04-15: 25 ug via INTRAVENOUS

## 2022-04-15 MED ORDER — LIDOCAINE HCL 1 % IJ SOLN
INTRAMUSCULAR | Status: AC
  Filled 2022-04-15: qty 20

## 2022-04-15 MED ORDER — FENTANYL CITRATE (PF) 100 MCG/2ML IJ SOLN
INTRAMUSCULAR | Status: AC
  Filled 2022-04-15: qty 2

## 2022-04-15 MED ORDER — IOHEXOL 300 MG/ML  SOLN
100.0000 mL | Freq: Once | INTRAMUSCULAR | Status: AC | PRN
  Administered 2022-04-15: 10 mL

## 2022-04-15 MED ORDER — TOBRAMYCIN SULFATE 1.2 G IJ SOLR
INTRAMUSCULAR | Status: AC
  Filled 2022-04-15: qty 1.2

## 2022-04-15 MED ORDER — LIDOCAINE HCL (PF) 1 % IJ SOLN
INTRAMUSCULAR | Status: AC
  Filled 2022-04-15: qty 30

## 2022-04-15 MED ORDER — CEFAZOLIN SODIUM-DEXTROSE 2-4 GM/100ML-% IV SOLN
INTRAVENOUS | Status: AC
  Administered 2022-04-15: 2 g via INTRAVENOUS
  Filled 2022-04-15: qty 100

## 2022-04-15 MED ORDER — BUPIVACAINE HCL (PF) 0.5 % IJ SOLN
INTRAMUSCULAR | Status: AC
  Filled 2022-04-15: qty 30

## 2022-04-15 MED ORDER — CEFAZOLIN SODIUM-DEXTROSE 2-4 GM/100ML-% IV SOLN: 2.0000 g | INTRAVENOUS | Status: AC

## 2022-04-15 MED ORDER — MIDAZOLAM HCL 2 MG/2ML IJ SOLN
INTRAMUSCULAR | Status: AC
  Filled 2022-04-15: qty 2

## 2022-04-15 MED ORDER — MIDAZOLAM HCL 2 MG/2ML IJ SOLN
INTRAMUSCULAR | Status: AC | PRN
  Administered 2022-04-15: 1 mg via INTRAVENOUS

## 2022-04-15 NOTE — Sedation Documentation (Signed)
Patient in IR suite for scheduled kyphoplasty procedure. Patient offered assistance from IR staff to move from stretcher to table. Patient states he is able to move to the table on his on. Patient was able to move from stretcher to table without any difficulty (IR staff present entire time of offer assistance if needed). Patient given pillows and cushions under head and extremities for comfort. Patient denies any pain or discomfort after moving, will continue to monitor.

## 2022-04-15 NOTE — Procedures (Signed)
INR. Status post T11 bone ablation of pathologic compression fracture followed by balloon kyphoplasty.  No acute complications.  Arlean Hopping MD

## 2022-04-15 NOTE — Progress Notes (Signed)
Patient and wife was given discharge instructions. Both verbalized understanding. 

## 2022-04-15 NOTE — H&P (Signed)
Chief Complaint: Patient was seen in consultation today for T 11 fracture  at the request of [place]  Referring Physician(s): Deveshwar,Sanjeev  Supervising Physician: Luanne Bras  Patient Status: [facility] - Out-pt  History of Present Illness: Jack [name] is a 88 y.o. male with PMH of ascending aortic aneurysm, CKD stage III, diastolic dysfunction, GERD, HTN, myeloma and PAF on Eliquis. Pt is well known to IR service having previous balloon kyphoplasties at T12 and L1 02/02/21. Pt followed by oncology, presented complaining of continuing back pain. He underwent MR studies 03/26/22 that found subacute compression fracture of T11 with approximately 30 % vertebral height loss, enhancing lesions within T8 and T9. Pt presents today for T11 kyphoplasty with ablation of enhancing lesion. Procedure was approved by Dr. Estanislado Pandy.   MR Thoracic Spine 03/26/22: IMPRESSION: 1. Subacute pathologic fracture of the T11 vertebral body with approximately 30% vertebral body height loss where there is a small enhancing lesion present within the vertebral body anteriorly. 2. Additional enhancing lesions within the T8 and T9 vertebral bodies. Overall, size and number of bone lesions has significantly decreased when compared with the previous MRI from 2022. Findings compatible with a favorable response to treatment. 3. Chronic compression fractures of T12 and L1 status post cement augmentation. 4. Similar mild degenerative changes throughout the thoracic spine with mild bilateral foraminal narrowing at T10-11 and T11-12. No canal stenosis at any level. 5. Known ascending thoracic aortic aneurysm is suboptimally assessed by protocol.   Past Medical History:  Diagnosis Date   Ascending aortic aneurysm ([place])    a. 12/2020 Echo: Asc Ao 53m, Ao root 450m   CKD (chronic kidney disease), stage III (HCC)    Diastolic dysfunction    a. 12/2020 Echo: EF 50-55%, no rwma, mild LVH, GrI DD, nl  RV fxn, mild AI. Asc Ao 4839mAo root 26m8m Elevated prostate specific antigen (PSA)    has been 7 for a year    GERD (gastroesophageal reflux disease)    History of colon polyps 2008   [facility] History of kidney stones    Hyperlipidemia    Hypertension    Myeloma (HCC)[place] PAF (paroxysmal atrial fibrillation) (HCC)[place] a. 01/2021-->Eliquis (CHA2DS2VASc = 3-4).   Prostate hypertrophy    diagnosed at age 42 d67 to hematospermia    Past Surgical History:  Procedure Laterality Date   CATARACT EXTRACTION W/PHACO Left 01/10/2018   Procedure: CATARACT EXTRACTION PHACO AND INTRAOCULAR LENS PLACEMENT (IOC)[place] PorfBirder Robson;  Location: [facility] ORS;  Service: Ophthalmology;  Laterality: Left;  US 0[name]24.8 AP% 14.9 CDE 3.68 Fluid pack lot # [id number] CATARACT EXTRACTION W/PHACO Right 01/25/2018   Procedure: CATARACT EXTRACTION PHACO AND INTRAOCULAR LENS PLACEMENT (IOC);  Surgeon: PorfBirder Robson;  Location: [facility] ORS;  Service: Ophthalmology;  Laterality: Right;  US 0[name]42 AP% 10.8 CDE 4.59 Fluid pack lot # [id number] COLON SURGERY     CYSTOSCOPY W/ URETERAL STENT PLACEMENT Right 10/16/2017   Procedure: right  URETERAL STENT PLACEMENT,cystoscopy bilateral stent removal,rretrograde;  Surgeon: StoiAbbie Sons;  Location: [facility] ORS;  Service: Urology;  Laterality: Right;   CYSTOSCOPY/URETEROSCOPY/HOLMIUM LASER/STENT PLACEMENT Right 12/16/2020   Procedure: CYSTOSCOPY/URETEROSCOPY/HOLMIUM LASER/STENT PLACEMENT;  Surgeon: StoiAbbie Sons;  Location: [facility] ORS;  Service: Urology;  Laterality: Right;   EXTRACORPOREAL SHOCK WAVE LITHOTRIPSY Right 12/11/2020   Procedure: EXTRACORPOREAL SHOCK WAVE LITHOTRIPSY (ESWL);  Surgeon: StoiAbbie Sons;  [facility];  Service: Urology;  Laterality: Right;   IR KYPHO EA ADDL LEVEL THORACIC OR LUMBAR  02/02/2021   IR KYPHO LUMBAR INC FX REDUCE BONE BX UNI/BIL CANNULATION INC/IMAGING  02/02/2021   KIDNEY STONE SURGERY      KYPHOPLASTY N/A 03/12/2021   Procedure: Hewitt Shorts, L3;  Surgeon: Hessie Knows, MD;  Location: [facility] ORS;  Service: Orthopedics;  Laterality: N/A;   RESECTION SOFT TISSUE TUMOR LEG / ANKLE RADICAL  jan 2009   [facility],  right thigh/knee , nonmalignant   SMALL INTESTINE SURGERY  1946   implaed on picket fence, punctured stomach   TONSILLECTOMY      Allergies: Quinolones, Amlodipine, Azithromycin, Lipitor [atorvastatin], Lisinopril, and Zetia [ezetimibe]  Medications: Prior to Admission medications   Medication Sig Start Date End Date Taking? Authorizing Provider  acetaminophen (TYLENOL) 500 MG tablet Take 500-1,000 mg by mouth every 8 (eight) hours as needed for moderate pain.   Yes [provider]  Ascorbic Acid (VITAMIN C) 1000 MG tablet Take 1,000 mg by mouth in the morning and at bedtime.   Yes [provider]  Calcium Carb-Cholecalciferol (OYSTER SHELL CALCIUM W/D) 500-5 MG-MCG TABS Take 1 tablet by mouth in the morning and at bedtime.   Yes [provider]  carvedilol (COREG) 25 MG tablet Take 1 tablet (25 mg total) by mouth 2 (two) times daily with a meal. 08/31/21  Yes Finnegan, Kathlene November, MD  dexamethasone (DECADRON) 4 MG tablet TAKE 5 TABLETS BY MOUTH ONE TIME PER WEEK Patient taking differently: Take 20 mg by mouth every Wednesday. 02/02/22  Yes Lloyd Huger, MD  feeding supplement (ENSURE ENLIVE / ENSURE PLUS) LIQD Take 237 mLs by mouth 3 (three) times daily between meals. Patient taking differently: Take 237 mLs by mouth daily with lunch. 04/07/21  Yes Enzo Bi, MD  gabapentin (NEURONTIN) 300 MG capsule TAKE 2 CAPSULES BY MOUTH 3 TIMES DAILY. Patient taking differently: Take 600 mg by mouth 2 (two) times daily. 12/22/21  Yes Lloyd Huger, MD  lenalidomide (REVLIMID) 20 MG capsule Take 1 capsule (20 mg total) by mouth daily. Take for 21 days, then hold for 7 days. Repeat every 28 days 03/30/22  Yes Lloyd Huger, MD  morphine (MS  CONTIN) 30 MG 12 hr tablet Take 1 tablet (30 mg total) by mouth every 12 (twelve) hours. 03/24/22  Yes Lloyd Huger, MD  Multiple Vitamin (MULTIVITAMIN WITH MINERALS) TABS tablet Take 1 tablet by mouth daily. 04/08/21  Yes Enzo Bi, MD  Multiple Vitamins-Minerals (PRESERVISION AREDS PO) Take 1 capsule by mouth in the morning and at bedtime.   Yes [provider]  polyethylene glycol (MIRALAX / GLYCOLAX) 17 g packet Take 17 g by mouth 2 (two) times daily. Patient taking differently: Take 17 g by mouth daily. 04/07/21  Yes Enzo Bi, MD  potassium citrate (UROCIT-K) 10 MEQ (1080 MG) SR tablet TAKE 1 TABLET (10 MEQ TOTAL) BY MOUTH 3 (THREE) TIMES DAILY WITH MEALS. 11/18/21  Yes Stoioff, Ronda Fairly, MD  senna-docusate (SENOKOT-S) 8.6-50 MG tablet Take 1 tablet by mouth 2 (two) times daily. 03/17/21  Yes Nicole Kindred A, DO  tamsulosin (FLOMAX) 0.4 MG CAPS capsule TAKE 1 CAPSULE BY MOUTH EVERY DAY 04/13/21  Yes Stoioff, Ronda Fairly, MD  ELIQUIS 5 MG TABS tablet TAKE 1 TABLET BY MOUTH TWICE A DAY 04/05/22   Lloyd Huger, MD  lidocaine (XYLOCAINE) 5 % ointment Apply topically 3 (three) times daily as needed for mild pain or  moderate pain. 08/19/21   Lloyd Huger, MD  naloxone (NARCAN) nasal spray 4 mg/0.1 mL SPRAY 1 SPRAY INTO ONE NOSTRIL AS DIRECTED FOR OPIOID OVERDOSE (TURN PERSON ON SIDE AFTER DOSE. IF NO RESPONSE IN 2-3 MINUTES OR PERSON RESPONDS BUT RELAPSES, REPEAT USING A NEW SPRAY DEVICE AND SPRAY INTO THE OTHER NOSTRIL. CALL 911 AFTER USE.) * EMERGENCY USE ONLY * 03/25/21   Borders, Kirt Boys, NP  ondansetron (ZOFRAN) 8 MG tablet Take 1 tablet (8 mg total) by mouth every 8 (eight) hours as needed for nausea or vomiting. 03/25/21   Borders, Kirt Boys, NP     Family History  Problem Relation Age of Onset   Hypertension Father    Hyperlipidemia Father    Cancer Sister        thyroid - dx in late 20's    Social History   Socioeconomic History   Marital status: Married    Spouse  name: Geologist, engineering   Number of children: 4   Years of education: 16   Highest education level: Not on file  Occupational History   Occupation: Retired    Fish farm manager: retired    Comment: Real Estate   Tobacco Use   Smoking status: Former    Types: Cigarettes    Quit date: 07/05/1965    Years since quitting: 56.8   Smokeless tobacco: Never  Vaping Use   Vaping Use: Never used  Substance and Sexual Activity   Alcohol use: Yes    Alcohol/week: 1.0 standard drink of alcohol    Types: 1 Standard drinks or equivalent per week    Comment: occassionaly   Drug use: No   Sexual activity: Yes    Birth control/protection: None  Other Topics Concern   Not on file  Social History Narrative   Not on file   Social Determinants of Health   Financial Resource Strain: Not on file  Food Insecurity: Not on file  Transportation Needs: Not on file  Physical Activity: Not on file  Stress: Not on file  Social Connections: Not on file     Review of Systems: A 12 point ROS discussed and pertinent positives are indicated in the HPI above.  All other systems are negative.  Review of Systems  Constitutional:  Negative for appetite change, chills, fatigue and fever.  Respiratory:  Negative for shortness of breath.   Cardiovascular:  Negative for chest pain and leg swelling.  Gastrointestinal:  Negative for abdominal pain, nausea and vomiting.  Musculoskeletal:  Positive for back pain.  Neurological:  Positive for dizziness, light-headedness and headaches. Negative for weakness.    Vital Signs: BP (!) 172/96   Pulse (!) 59   Temp 97.7 F (36.5 C) (Temporal)   Resp 16   Ht _0  (1.727 m)   Wt 170 lb (77.1 kg)   SpO2 97%   BMI 25.85 kg/m     Physical Exam Vitals reviewed.  Constitutional:      General: He is not in acute distress.    Appearance: Normal appearance. He is not ill-appearing.  HENT:     Head: Normocephalic and atraumatic.     Mouth/Throat:     Mouth: Mucous membranes are  dry.     Pharynx: Oropharynx is clear.  Eyes:     Extraocular Movements: Extraocular movements intact.     Pupils: Pupils are equal, round, and reactive to light.  Cardiovascular:     Rate and Rhythm: Regular rhythm. Bradycardia present.     Pulses:  Normal pulses.     Heart sounds: No murmur heard. Pulmonary:     Effort: Pulmonary effort is normal. No respiratory distress.     Breath sounds: Normal breath sounds.  Abdominal:     General: Bowel sounds are normal. There is no distension.     Palpations: Abdomen is soft.     Tenderness: There is no abdominal tenderness. There is no guarding.  Musculoskeletal:     Right lower leg: No edema.     Left lower leg: No edema.  Skin:    General: Skin is warm and dry.  Neurological:     Mental Status: He is alert and oriented to person, place, and time.  Psychiatric:        Mood and Affect: Mood normal.        Behavior: Behavior normal.        Thought Content: Thought content normal.        Judgment: Judgment normal.     Imaging: MR Lumbar Spine W Wo Contrast  Result Date: 03/26/2022 CLINICAL DATA:  Low back pain. EXAM: MRI LUMBAR SPINE WITHOUT AND WITH CONTRAST TECHNIQUE: Multiplanar and multiecho pulse sequences of the lumbar spine were obtained without and with intravenous contrast. CONTRAST:  7.75m GADAVIST GADOBUTROL 1 MMOL/ML IV SOLN COMPARISON:  03/27/2021 FINDINGS: Segmentation:  Standard. Alignment:  Physiologic. Vertebrae: Incompletely visualized subacute T11 vertebral body compression fracture better evaluated on the MRI thoracic spine performed earlier same day and reported separately. Chronic T12, L1, L2 and L3 vertebral body compression fractures with prior augmentation. 4 mm retropulsion of the superior posterior margin of the T12 vertebral body mildly impressing on the thecal sac without spinal stenosis. 3 mm retropulsion of the superior posterior margin of the L1 and L3 vertebral bodies mildly impressing upon the thecal sac.  Chronic L4 vertebral body compression fracture. Conus medullaris and cauda equina: Conus extends to the L1 level. Conus and cauda equina appear normal. Paraspinal and other soft tissues: No acute paraspinal abnormality. Disc levels: Disc spaces: Disc desiccation throughout the lumbar spine. T12-L1: No disc protrusion. No spinal stenosis. Mild bilateral foraminal stenosis. L1-L2: No significant disc bulge. No neural foraminal stenosis. No central canal stenosis. L2-L3: No significant disc protrusion. Mild left foraminal stenosis. No right foraminal stenosis. No spinal stenosis. L3-L4: Mild broad-based disc bulge. Mild bilateral facet arthropathy. No foraminal or central canal stenosis. L4-L5: Mild broad-based disc bulge. Mild bilateral facet arthropathy. No foraminal or central canal stenosis. L5-S1: No significant disc bulge. No neural foraminal stenosis. No central canal stenosis. IMPRESSION: 1. No acute osseous injury of the lumbar spine. 2. Incompletely visualized subacute T11 vertebral body compression fracture better evaluated on the MRI thoracic spine performed earlier same day and reported separately. 3. Chronic T12, L1, L2 and L3 vertebral body compression fractures with prior augmentation. Mild thoracolumbar spine spondylosis. Electronically Signed   By: HKathreen DevoidM.D.   On: 03/26/2022 15:07   MR Thoracic Spine W Wo Contrast  Result Date: 03/26/2022 CLINICAL DATA:  Mid to low back pain. No known injury. History of multiple myeloma on therapy EXAM: MRI THORACIC WITHOUT AND WITH CONTRAST TECHNIQUE: Multiplanar and multiecho pulse sequences of the thoracic spine were obtained without and with intravenous contrast. CONTRAST:  7.540mGADAVIST GADOBUTROL 1 MMOL/ML IV SOLN COMPARISON:  MRI 03/28/2021.  CT 03/22/2022 FINDINGS: Alignment:  Physiologic. Vertebrae: Chronic compression fractures of T12 and L1 status post cement augmentation. Subacute fracture of the T11 vertebral body with approximately 30%  vertebral body height loss.  Mild residual bone marrow edema along the fracture margin. No bony retropulsion. No fracture extension into the posterior elements. Small rounded enhancing lesion within the anterior aspect of the T11 vertebral body (series 31, image 9) compatible with a pathologic fracture. Additional enhancing lesions within the T8 and T9 vertebral bodies. Overall size and number of bone lesions has significantly decreased when compared with the previous MRI from 2022. No additional acute fractures. No evidence of discitis. Cord:  No focal cord lesion.  No epidural space mass. Paraspinal and other soft tissues: Known ascending thoracic aortic aneurysm is suboptimally assessed by protocol. Paraspinal soft tissues otherwise within normal limits. Disc levels: Similar mild degenerative changes throughout the thoracic spine. No canal stenosis at any level. There is bilateral foraminal narrowing at T10-11 and T11-12. IMPRESSION: 1. Subacute pathologic fracture of the T11 vertebral body with approximately 30% vertebral body height loss where there is a small enhancing lesion present within the vertebral body anteriorly. 2. Additional enhancing lesions within the T8 and T9 vertebral bodies. Overall, size and number of bone lesions has significantly decreased when compared with the previous MRI from 2022. Findings compatible with a favorable response to treatment. 3. Chronic compression fractures of T12 and L1 status post cement augmentation. 4. Similar mild degenerative changes throughout the thoracic spine with mild bilateral foraminal narrowing at T10-11 and T11-12. No canal stenosis at any level. 5. Known ascending thoracic aortic aneurysm is suboptimally assessed by protocol. Electronically Signed   By: Davina Poke D.O.   On: 03/26/2022 14:49   CT ANGIO CHEST AORTA W/CM & OR WO/CM  Result Date: 03/22/2022 CLINICAL DATA:  No surgery. NKI. Eval aneurysm per MD. No current symptoms per pt. History of  light chain myeloma. EXAM: CT ANGIOGRAPHY CHEST WITH CONTRAST TECHNIQUE: Multidetector CT imaging of the chest was performed using the standard protocol during bolus administration of intravenous contrast. Multiplanar CT image reconstructions and MIPs were obtained to evaluate the vascular anatomy. RADIATION DOSE REDUCTION: This exam was performed according to the departmental dose-optimization program which includes automated exposure control, adjustment of the mA and/or kV according to patient size and/or use of iterative reconstruction technique. CONTRAST:  79m OMNIPAQUE IOHEXOL 350 MG/ML SOLN COMPARISON:  11/21/2017.  PET-CT, 12/08/2021. FINDINGS: Cardiovascular: Ascending thoracic aorta is dilated to 4.6 cm, stable from 2019 allowing for slight differences in measurement technique. Mild aortic atherosclerosis. No dissection. Arch branch vessels are widely patent. Heart is normal in size and configuration. Three-vessel coronary artery calcifications. No pericardial effusion. Mediastinum/Nodes: No enlarged mediastinal, hilar, or axillary lymph nodes. Thyroid gland, trachea, and esophagus demonstrate no significant findings. Lungs/Pleura: Minor areas of subsegmental atelectasis. Lungs otherwise clear. No pleural effusion or pneumothorax. Upper Abdomen: No acute abnormality. Musculoskeletal: Skeletal demineralization. There is a coarsened trabecula with intervening lucencies. Lower thoracic spine and upper lumbar spine fractures, T12, L1 and L2 treated with previous vertebroplasty. No acute findings. Review of the MIP images confirms the above findings. IMPRESSION: 1. Ascending thoracic aorta dilated to 4.6 cm. Ascending thoracic aortic aneurysm. Recommend semi-annual imaging followup by CTA or MRA and referral to cardiothoracic surgery if not already obtained. This recommendation follows 2010 ACCF/AHA/AATS/ACR/ASA/SCA/SCAI/SIR/STS/SVM Guidelines for the Diagnosis and Management of Patients With Thoracic Aortic  Disease. Circulation. 2010; 121:: M211-D552 Aortic aneurysm NOS (ICD10-I71.9) 2. No acute findings in the chest. 3. Skeletal changes consistent with the patient's known myeloma. Chronic lower thoracic and upper lumbar spine vertebral fractures. Aortic Atherosclerosis (ICD10-I70.0). Electronically Signed   By: DDedra SkeensD.  On: 03/22/2022 15:51    Labs:  CBC: Recent Labs    03/11/22 1504 03/24/22 1140 04/08/22 1502 04/15/22 0829  WBC 4.6 3.6* 6.6 8.6  HGB 11.7* 12.4* 12.1* 13.5  HCT 35.8* 39.3 37.1* 42.0  PLT 107* 101* 121* 128*    COAGS: Recent Labs    03/24/22 1140  INR 1.4*    BMP: Recent Labs    02/09/22 1241 03/11/22 1504 04/08/22 1502 04/15/22 0829  NA 137 139 140 137  K 4.3 4.1 4.2 4.8  CL 105 106 109 104  CO2 _0 GLUCOSE 101* 125* 111* 123*  BUN 30* 35* 35* 34*  CALCIUM 8.6* 8.6* 9.3 9.2  CREATININE 1.14 1.12 1.13 1.35*  GFRNONAA >60 >60 >60 51*    LIVER FUNCTION TESTS: Recent Labs    01/12/22 0944 02/09/22 1241 03/11/22 1504 04/08/22 1502  BILITOT 0.6 0.4 0.5 0.5  AST _1 ALT _2 ALKPHOS 69 60 65 57  PROT 5.9* 5.6* 5.5* 5.8*  ALBUMIN 3.5 3.5 3.3* 3.7    TUMOR MARKERS: No results for input(s): "AFPTM", "CEA", "CA199", "CHROMGRNA" in the last 8760 hours.  Assessment and Plan: History of ascending aortic aneurysm, CKD stage III, diastolic dysfunction, GERD, HTN, myeloma and PAF on Eliquis. Pt is well known to IR service having previous balloon kyphoplasties at T12 and L1 02/02/21. Pt followed by oncology, presented complaining of continuing back pain. He underwent MR studies 03/26/22 that found subacute compression fracture of T11 with approximately 30 % vertebral height loss, enhancing lesions within T8 and T9. Pt presents today for T11 kyphoplasty with ablation of enhancing lesion. Procedure was approved by Dr. Estanislado Pandy.   MR Thoracic Spine 03/26/22: IMPRESSION: 1. Subacute pathologic fracture of the T11 vertebral  body with approximately 30% vertebral body height loss where there is a small enhancing lesion present within the vertebral body anteriorly. 2. Additional enhancing lesions within the T8 and T9 vertebral bodies. Overall, size and number of bone lesions has significantly decreased when compared with the previous MRI from 2022. Findings compatible with a favorable response to treatment. 3. Chronic compression fractures of T12 and L1 status post cement augmentation. 4. Similar mild degenerative changes throughout the thoracic spine with mild bilateral foraminal narrowing at T10-11 and T11-12. No canal stenosis at any level. 5. Known ascending thoracic aortic aneurysm is suboptimally assessed by protocol.  Pt noted to ambulate to BR and back to bed w/o assistance or difficulty.  Pt is A&O, calm and pleasant.  He is in no distress.  Pt states he in NPO per order.  He has been holding Eliquis for 9 days.    Risks and benefits of T11 KP with ablation under moderate sedation were discussed with the patient including, but not limited to education regarding the natural healing process of compression fractures without intervention, bleeding, infection, cement migration which may cause spinal cord damage, paralysis, pulmonary embolism or even death.  This interventional procedure involves the use of X-rays and because of the nature of the planned procedure, it is possible that we will have prolonged use of X-ray fluoroscopy.  Potential radiation risks to you include (but are not limited to) the following: - A slightly elevated risk for cancer  several years later in life. This risk is typically less than 0.5% percent. This risk is low in comparison to the normal incidence of human cancer, which is 33% for women and 50% for men according to the  [place]. - Radiation induced injury can include skin redness, resembling a rash, tissue breakdown / ulcers and hair loss (which can be  temporary or permanent).   The likelihood of either of these occurring depends on the difficulty of the procedure and whether you are sensitive to radiation due to previous procedures, disease, or genetic conditions.   IF your procedure requires a prolonged use of radiation, you will be notified and given written instructions for further action.  It is your responsibility to monitor the irradiated area for the 2 weeks following the procedure and to notify your physician if you are concerned that you have suffered a radiation induced injury.    All of the patient's questions were answered, patient is agreeable to proceed.  Consent signed and in chart.     Thank you for this interesting consult.  I greatly enjoyed meeting LABIB CWYNAR and look forward to participating in their care.  A copy of this report was sent to the requesting provider on this date.  Electronically Signed: Tyson Alias, NP 04/15/2022, 9:23 AM   I spent a total of 20 minutes in face to face in clinical consultation, greater than 50% of which was counseling/coordinating care for pathologic fracture of T 11.

## 2022-04-15 NOTE — Discharge Instructions (Addendum)
1.  No stooping bending or lifting weights above 10 pounds for 2 weeks. 2.  Use walker to ambulate for 2 weeks. 3.  No driving for 2 weeks. 4.  Resume Eliquis starting tomorrow. 5.  RTC as needed in 2 weeks.

## 2022-04-15 NOTE — Sedation Documentation (Signed)
Procedure complete. Patient assisted back to stretcher with the assistance of IR staff. Patient denies any pain or discomfort after transitioning from IR table to stretcher. Dressing (clean, dry, and intact).

## 2022-04-16 HISTORY — PX: IR BONE TUMOR(S)RF ABLATION: IMG2284

## 2022-04-19 ENCOUNTER — Telehealth: Payer: PPO | Admitting: Hospice and Palliative Medicine

## 2022-04-20 ENCOUNTER — Inpatient Hospital Stay: Payer: PPO | Attending: Oncology | Admitting: Hospice and Palliative Medicine

## 2022-04-20 DIAGNOSIS — C9000 Multiple myeloma not having achieved remission: Secondary | ICD-10-CM | POA: Diagnosis not present

## 2022-04-20 DIAGNOSIS — Z515 Encounter for palliative care: Secondary | ICD-10-CM | POA: Diagnosis not present

## 2022-04-20 DIAGNOSIS — G893 Neoplasm related pain (acute) (chronic): Secondary | ICD-10-CM

## 2022-04-20 MED ORDER — MORPHINE SULFATE ER 30 MG PO TBCR: 30.0000 mg | EXTENDED_RELEASE_TABLET | Freq: Two times a day (BID) | ORAL | 0 refills | Status: DC

## 2022-04-20 NOTE — Progress Notes (Signed)
Virtual Visit via Telephone Note  I connected with Jack [name] on 04/20/22 at  1:20 PM EDT by telephone and verified that I am speaking with the correct person using two identifiers.  Location: Patient: Home Provider: Clinic   I discussed the limitations, risks, security and privacy concerns of performing an evaluation and management service by telephone and the availability of in person appointments. I also discussed with the patient that there may be a patient responsible charge related to this service. The patient expressed understanding and agreed to proceed.   History of Present Illness: Jack [name] is a 88 y.o. male with multiple medical problems including hypertension, CKD stage IIIb, A. fib on Eliquis, and anemia.  Patient was hospitalized 01/02/2021 to 01/07/2021 with T12 compression fracture with recommendation for conservative management including use of a TLSO brace.  Patient was hospitalized again 01/29/2021 to 02/07/2021 with recurrent back pain and MR showing T12 and new L1 compression fracture.  Patient underwent T12/L1 kyphoplasty on 02/02/2021.  There was also suspicion of a left fifth rib fracture, which occurred while transitioning from the stretcher for his kyphoplasty.  Patient had mild hypercalcemia with SPEP and UPEP concerning for possible myeloma.  Patient was referred to [facility] for work-up.  He was readmitted 03/11/2021 - 03/17/2021 with worsening back pain.  CT revealed new compression fractures of L2 and L3.  Patient underwent kyphoplasty on 6/30.  He was hospitalized again 03/26/2021-04/07/2021 with severe back/flank pain.  MRI revealed T10 pathologic compression fracture.  He was started on treatment for multiple myeloma with dexamethasone/Velcade and XRT to spine.   Thoracic MRI revealed new T11 pathologic compression fracture.   Observations/Objective: Spoke with patient and wife by phone.    Patient is s/p T11 kyphoplasty and ablation.   He reports the  pain is significantly improved since he underwent kyphoplasty.  He is utilizing a walker but feels like he is steadily improving in regards to strength.  Discussed option of home health PT the patient does not feel like he needs that at this point.  Discussed pain regimen.  We will continue MS Contin at current 30 mg every 12 hours dose.  Consider dose reduction at time of next refill if pain remains stable.  Assessment and Plan: Compression fracture -status post kyphoplasty with overall improvement in pain.  Continue MS Contin  Follow Up Instructions: 1 month   I discussed the assessment and treatment plan with the patient. The patient was provided an opportunity to ask questions and all were answered. The patient agreed with the plan and demonstrated an understanding of the instructions.   The patient was advised to call back or seek an in-person evaluation if the symptoms worsen or if the condition fails to improve as anticipated.  I provided 10 minutes of non-face-to-face time during this encounter.   Irean Hong, NP

## 2022-04-27 ENCOUNTER — Other Ambulatory Visit: Payer: Self-pay | Admitting: *Deleted

## 2022-04-27 ENCOUNTER — Institutional Professional Consult (permissible substitution): Payer: PPO | Admitting: Thoracic Surgery (Cardiothoracic Vascular Surgery)

## 2022-04-27 ENCOUNTER — Encounter: Payer: Self-pay | Admitting: Thoracic Surgery (Cardiothoracic Vascular Surgery)

## 2022-04-27 VITALS — BP 126/75 | HR 63 | Resp 20 | Ht 68.0 in | Wt 173.0 lb

## 2022-04-27 DIAGNOSIS — I7121 Aneurysm of the ascending aorta, without rupture: Secondary | ICD-10-CM | POA: Diagnosis not present

## 2022-04-27 DIAGNOSIS — C9000 Multiple myeloma not having achieved remission: Secondary | ICD-10-CM

## 2022-04-27 NOTE — Progress Notes (Signed)
PCP is [name], MD Referring Provider is Wilder Glade*  Chief Complaint  Patient presents with   Thoracic Aortic Aneurysm    Surgical consult, CTA Chest 03/22/22/ ECHO 03/09/22    HPI: Jack [name] is sent for consultation regarding an ascending aneurysm.  Jack [name] is an 88 year old man with a history of multiple myeloma, hypertension, hyperlipidemia, paroxysmal atrial fibrillation, reflux, stage III chronic kidney disease, kidney stones, and an ascending aneurysm.  He had an ascending aneurysm noted on the CT of the chest in 2019.  Measured 4.6 cm at that time.  In June he had an echocardiogram which showed mild to moderate aortic insufficiency, EF 50%, and an ascending aneurysm which was measured at 51 mm.  A CT angiogram of the chest was done in July.  It showed the aneurysm measured 4.6 cm.  It was unchanged from a CT scan in 2019.  His primary complaint is back pain.  He recently saw chiropractor and that has improved.  He also complains of bruising secondary to Eliquis.  No chest pain, pressure, tightness, or shortness of breath.  Occasionally has some swelling in his legs.  Past Medical History:  Diagnosis Date   Ascending aortic aneurysm ([place])    a. 12/2020 Echo: Asc Ao 19m, Ao root 463m   CKD (chronic kidney disease), stage III (HCC)    Diastolic dysfunction    a. 12/2020 Echo: EF 50-55%, no rwma, mild LVH, GrI DD, nl RV fxn, mild AI. Asc Ao 4864mAo root 74m43m Elevated prostate specific antigen (PSA)    has been 7 for a year    GERD (gastroesophageal reflux disease)    History of colon polyps 2008   [facility] History of kidney stones    Hyperlipidemia    Hypertension    Myeloma (HCC)[place] PAF (paroxysmal atrial fibrillation) (HCC)[place] a. 01/2021-->Eliquis (CHA2DS2VASc = 3-4).   Prostate hypertrophy    diagnosed at age 4 d71 to hematospermia    Past Surgical History:  Procedure Laterality Date   CATARACT EXTRACTION W/PHACO Left 01/10/2018    Procedure: CATARACT EXTRACTION PHACO AND INTRAOCULAR LENS PLACEMENT (I[place] Porf[place];  Location: [facility];  Service: Ophthalmology;  Laterality: Left;  US 0[name]24.8 AP% 14.9 CDE 3.68 Fluid pack lot # [id number] CATARACT EXTRACTION W/PHACO Right 01/25/2018   Procedure: CATARACT EXTRACTION PHACO AND INTRAOCULAR LENS PLACEMENT (IOC);  Surgeon: PorfBirder Robson;  Location: [facility] ORS;  Service: Ophthalmology;  Laterality: Right;  US 0[name]42 AP% 10.8 CDE 4.59 Fluid pack lot # [id number] COLON SURGERY     CYSTOSCOPY W/ URETERAL STENT PLACEMENT Right 10/16/2017   Procedure: right  URETERAL STENT PLACEMENT,cystoscopy bilateral stent removal,rretrograde;  Surgeon: StoiAbbie Sons;  Location: [facility] ORS;  Service: Urology;  Laterality: Right;   CYSTOSCOPY/URETEROSCOPY/HOLMIUM LASER/STENT PLACEMENT Right 12/16/2020   Procedure: CYSTOSCOPY/URETEROSCOPY/HOLMIUM LASER/STENT PLACEMENT;  Surgeon: StoiAbbie Sons;  Location: [facility] ORS;  Service: Urology;  Laterality: Right;   EXTRACORPOREAL SHOCK WAVE LITHOTRIPSY Right 12/11/2020   Procedure: EXTRACORPOREAL SHOCK WAVE LITHOTRIPSY (ESWL);  Surgeon: StoiAbbie Sons;  Location: [facility] ORS;  Service: Urology;  Laterality: Right;   IR BONE TUMOR(S)RF ABLATION  04/16/2022   IR KYPHO EA ADDL LEVEL THORACIC OR LUMBAR  02/02/2021   IR KYPHO LUMBAR INC FX REDUCE BONE BX UNI/BIL CANNULATION INC/IMAGING  02/02/2021   IR KYPHO THORACIC WITH BONE BIOPSY  04/15/2022   KIDNEY STONE SURGERY  KYPHOPLASTY N/A 03/12/2021   Procedure: KYPHOPLASTY, Lamona Curl, L3;  Surgeon: Hessie Knows, MD;  Location: [facility] ORS;  Service: Orthopedics;  Laterality: N/A;   RESECTION SOFT TISSUE TUMOR LEG / ANKLE RADICAL  jan 2009   [facility],  right thigh/knee , nonmalignant   SMALL INTESTINE SURGERY  1946   implaed on picket fence, punctured stomach   TONSILLECTOMY      Family History  Problem Relation Age of Onset   Hypertension Father    Hyperlipidemia Father    Cancer  Sister        thyroid - dx in late 20's    Social History Social History   Tobacco Use   Smoking status: Former    Types: Cigarettes    Quit date: 07/05/1965    Years since quitting: 56.8   Smokeless tobacco: Never  Vaping Use   Vaping Use: Never used  Substance Use Topics   Alcohol use: Yes    Alcohol/week: 1.0 standard drink of alcohol    Types: 1 Standard drinks or equivalent per week    Comment: occassionaly   Drug use: No    Current Outpatient Medications  Medication Sig Dispense Refill   acetaminophen (TYLENOL) 500 MG tablet Take 500-1,000 mg by mouth every 8 (eight) hours as needed for moderate pain.     Ascorbic Acid (VITAMIN C) 1000 MG tablet Take 1,000 mg by mouth in the morning and at bedtime.     Calcium Carb-Cholecalciferol (OYSTER SHELL CALCIUM W/D) 500-5 MG-MCG TABS Take 1 tablet by mouth in the morning and at bedtime.     carvedilol (COREG) 25 MG tablet Take 1 tablet (25 mg total) by mouth 2 (two) times daily with a meal. 60 tablet 1   dexamethasone (DECADRON) 4 MG tablet TAKE 5 TABLETS BY MOUTH ONE TIME PER WEEK (Patient taking differently: Take 20 mg by mouth every Wednesday.) 20 tablet 4   ELIQUIS 5 MG TABS tablet TAKE 1 TABLET BY MOUTH TWICE A DAY 180 tablet 0   feeding supplement (ENSURE ENLIVE / ENSURE PLUS) LIQD Take 237 mLs by mouth 3 (three) times daily between meals. (Patient taking differently: Take 237 mLs by mouth daily with lunch.) 237 mL 12   gabapentin (NEURONTIN) 300 MG capsule TAKE 2 CAPSULES BY MOUTH 3 TIMES DAILY. (Patient taking differently: Take 600 mg by mouth 2 (two) times daily.) 180 capsule 2   lenalidomide (REVLIMID) 20 MG capsule Take 1 capsule (20 mg total) by mouth daily. Take for 21 days, then hold for 7 days. Repeat every 28 days 21 capsule 0   lidocaine (XYLOCAINE) 5 % ointment Apply topically 3 (three) times daily as needed for mild pain or moderate pain. 35.44 g 0   morphine (MS CONTIN) 30 MG 12 hr tablet Take 1 tablet (30 mg  total) by mouth every 12 (twelve) hours. 60 tablet 0   Multiple Vitamin (MULTIVITAMIN WITH MINERALS) TABS tablet Take 1 tablet by mouth daily.     Multiple Vitamins-Minerals (PRESERVISION AREDS PO) Take 1 capsule by mouth in the morning and at bedtime.     naloxone (NARCAN) nasal spray 4 mg/0.1 mL SPRAY 1 SPRAY INTO ONE NOSTRIL AS DIRECTED FOR OPIOID OVERDOSE (TURN PERSON ON SIDE AFTER DOSE. IF NO RESPONSE IN 2-3 MINUTES OR PERSON RESPONDS BUT RELAPSES, REPEAT USING A NEW SPRAY DEVICE AND SPRAY INTO THE OTHER NOSTRIL. CALL 911 AFTER USE.) * EMERGENCY USE ONLY * 1 each 0   polyethylene glycol (MIRALAX / GLYCOLAX) 17 g packet Take 17  g by mouth 2 (two) times daily. (Patient taking differently: Take 17 g by mouth daily.)  0   potassium citrate (UROCIT-K) 10 MEQ (1080 MG) SR tablet TAKE 1 TABLET (10 MEQ TOTAL) BY MOUTH 3 (THREE) TIMES DAILY WITH MEALS. 270 tablet 2   senna-docusate (SENOKOT-S) 8.6-50 MG tablet Take 1 tablet by mouth 2 (two) times daily. 30 tablet 0   tamsulosin (FLOMAX) 0.4 MG CAPS capsule TAKE 1 CAPSULE BY MOUTH EVERY DAY 90 capsule 1   ondansetron (ZOFRAN) 8 MG tablet Take 1 tablet (8 mg total) by mouth every 8 (eight) hours as needed for nausea or vomiting. (Patient not taking: Reported on 04/27/2022) 60 tablet 2   No current facility-administered medications for this visit.    Allergies  Allergen Reactions   Quinolones     Aortic dilation contraindicates FQ   Amlodipine Swelling and Other (See Comments)    LE edema   Azithromycin Nausea And Vomiting   Lipitor [Atorvastatin]     Muscle pain in RT Leg    Lisinopril Cough   Zetia [Ezetimibe]     Pain in legs     Review of Systems  Constitutional:  Negative for unexpected weight change.  HENT:  Positive for hearing loss.   Eyes:  Positive for visual disturbance (Macular degeneration).  Respiratory:  Negative for shortness of breath.   Cardiovascular:  Negative for chest pain.  Genitourinary:        Kidney stones   Musculoskeletal:  Positive for arthralgias and back pain.  Neurological:  Negative for weakness.  Hematological:  Bruises/bleeds easily.  All other systems reviewed and are negative.   BP 126/75   Pulse 63   Resp 20   Ht _0  (1.727 m)   Wt 173 lb (78.5 kg)   SpO2 93% Comment: RA  BMI 26.30 kg/m  Physical Exam Vitals reviewed.  Constitutional:      General: He is not in acute distress.    Appearance: Normal appearance.  HENT:     Head: Normocephalic and atraumatic.  Eyes:     Extraocular Movements: Extraocular movements intact.  Cardiovascular:     Rate and Rhythm: Normal rate and regular rhythm.     Heart sounds: Normal heart sounds. No murmur heard. Pulmonary:     Effort: Pulmonary effort is normal. No respiratory distress.     Breath sounds: Normal breath sounds. No wheezing.  Abdominal:     General: There is no distension.     Palpations: Abdomen is soft.  Musculoskeletal:     Right lower leg: No edema.     Left lower leg: No edema.  Skin:    General: Skin is warm and dry.  Neurological:     General: No focal deficit present.     Mental Status: He is alert and oriented to person, place, and time.     Cranial Nerves: No cranial nerve deficit.     Motor: No weakness.    Diagnostic Tests: Echocardiogram 03/09/2022 IMPRESSIONS     1. Left ventricular ejection fraction, by estimation, is 50 to 55%. The  left ventricle has low normal function. The left ventricle has no regional  wall motion abnormalities. Left ventricular diastolic parameters are  indeterminate.   2. Right ventricular systolic function is normal. The right ventricular  size is normal. Tricuspid regurgitation signal is inadequate for assessing  PA pressure.   3. The mitral valve is normal in structure. Mild mitral valve  regurgitation.   4. The aortic  valve is tricuspid. There is mild thickening of the aortic  valve. Aortic valve regurgitation is mild to moderate.   5. Aortic dilatation  noted. There is mild dilatation of the aortic root,  measuring 40 mm. There is moderate to severe dilatation of the ascending  aorta, measuring 51 mm. There is moderate dilatation of the aortic arch,  measuring 46 mm.   6. The inferior vena cava is normal in size with greater than 50%  respiratory variability, suggesting right atrial pressure of 3 mmHg.   CT ANGIOGRAPHY CHEST WITH CONTRAST   TECHNIQUE: Multidetector CT imaging of the chest was performed using the standard protocol during bolus administration of intravenous contrast. Multiplanar CT image reconstructions and MIPs were obtained to evaluate the vascular anatomy.   RADIATION DOSE REDUCTION: This exam was performed according to the departmental dose-optimization program which includes automated exposure control, adjustment of the mA and/or kV according to patient size and/or use of iterative reconstruction technique.   CONTRAST:  76m OMNIPAQUE IOHEXOL 350 MG/ML SOLN   COMPARISON:  11/21/2017.  PET-CT, 12/08/2021.   FINDINGS: Cardiovascular: Ascending thoracic aorta is dilated to 4.6 cm, stable from 2019 allowing for slight differences in measurement technique. Mild aortic atherosclerosis. No dissection. Arch branch vessels are widely patent.   Heart is normal in size and configuration. Three-vessel coronary artery calcifications. No pericardial effusion.   Mediastinum/Nodes: No enlarged mediastinal, hilar, or axillary lymph nodes. Thyroid gland, trachea, and esophagus demonstrate no significant findings.   Lungs/Pleura: Minor areas of subsegmental atelectasis. Lungs otherwise clear. No pleural effusion or pneumothorax.   Upper Abdomen: No acute abnormality.   Musculoskeletal: Skeletal demineralization. There is a coarsened trabecula with intervening lucencies. Lower thoracic spine and upper lumbar spine fractures, T12, L1 and L2 treated with previous vertebroplasty. No acute findings.   Review of the MIP  images confirms the above findings.   IMPRESSION: 1. Ascending thoracic aorta dilated to 4.6 cm. Ascending thoracic aortic aneurysm. Recommend semi-annual imaging followup by CTA or MRA and referral to cardiothoracic surgery if not already obtained. This recommendation follows 2010 ACCF/AHA/AATS/ACR/ASA/SCA/SCAI/SIR/STS/SVM Guidelines for the Diagnosis and Management of Patients With Thoracic Aortic Disease. Circulation. 2010; 121:: [id number] Aortic aneurysm NOS (ICD10-I71.9) 2. No acute findings in the chest. 3. Skeletal changes consistent with the patient's known myeloma. Chronic lower thoracic and upper lumbar spine vertebral fractures.   Aortic Atherosclerosis (ICD10-I70.0).     Electronically Signed   By: DLajean ManesM.D.   On: 03/22/2022 15:51 I personally reviewed the CT images and compared to the CT images from 2019.  The ascending aorta measures 4.6 cm.  Unchanged from the films in 2019.  Impression: REmarion Toralis an 88year old gentleman with a history of multiple myeloma, hypertension, hyperlipidemia, paroxysmal atrial fibrillation, reflux, stage III chronic kidney disease, kidney stones, and an ascending aneurysm.  Ascending aneurysm-4.6 cm by CT angiogram.  I explained that CT is much more accurate in terms of measurements and echocardiogram.  Aneurysm unchanged from a CT in 2019.  Indications for surgery would be size of greater than 5.5 cm or an increase in size of 5 mm in 6 months.  He does not meet he of those criteria.  Asked them to think about whether or not he would want to go through with the operation if it were indicated.  The procedure would be a major operation that would be high risk in a man of his age with his comorbidities and would be a prolonged recovery.  Their initial thought  was that he would not want to have surgery, but they will give it some more consideration.  They also will give consideration towhether they want to have surgery in an emergent  situation.  Guidelines suggest following with CT angiograms every 6 months.  I discussed this with Mr. and Mrs. [name].  He does not want to follow-up that frequently.  He would like another scan in a year.  Hypertension-blood pressure well controlled on current regimen.  They will continue to monitor at home.  Hyperlipidemia-unable to tolerate statins.  We did discuss the symptoms of aneurysm rupture or dissection which is severe chest or back pain for which he should seek medical attention immediately.  Plan: Return in 1 year with CT chest  I spent over 30 minutes in review of records, images, and consultation with Jack [name] today. Melrose Nakayama, MD [facility] [phone]

## 2022-04-28 ENCOUNTER — Other Ambulatory Visit: Payer: Self-pay

## 2022-04-28 ENCOUNTER — Encounter: Payer: Self-pay | Admitting: Oncology

## 2022-04-28 DIAGNOSIS — I10 Essential (primary) hypertension: Secondary | ICD-10-CM | POA: Diagnosis not present

## 2022-04-28 DIAGNOSIS — G893 Neoplasm related pain (acute) (chronic): Secondary | ICD-10-CM | POA: Diagnosis not present

## 2022-04-28 DIAGNOSIS — Z66 Do not resuscitate: Secondary | ICD-10-CM | POA: Diagnosis not present

## 2022-04-28 DIAGNOSIS — Z515 Encounter for palliative care: Secondary | ICD-10-CM | POA: Diagnosis not present

## 2022-04-28 DIAGNOSIS — Z9889 Other specified postprocedural states: Secondary | ICD-10-CM | POA: Diagnosis not present

## 2022-04-28 DIAGNOSIS — Z8781 Personal history of (healed) traumatic fracture: Secondary | ICD-10-CM | POA: Diagnosis not present

## 2022-04-28 DIAGNOSIS — C9000 Multiple myeloma not having achieved remission: Secondary | ICD-10-CM

## 2022-04-28 MED ORDER — LENALIDOMIDE 20 MG PO CAPS: 20.0000 mg | ORAL_CAPSULE | Freq: Every day | ORAL | 0 refills | Status: DC

## 2022-04-28 NOTE — Telephone Encounter (Signed)
Ok to refill 

## 2022-05-01 ENCOUNTER — Encounter: Payer: Self-pay | Admitting: Oncology

## 2022-05-03 ENCOUNTER — Other Ambulatory Visit: Payer: Self-pay

## 2022-05-03 DIAGNOSIS — C9000 Multiple myeloma not having achieved remission: Secondary | ICD-10-CM

## 2022-05-03 MED ORDER — LIDOCAINE 5 % EX OINT: TOPICAL_OINTMENT | Freq: Three times a day (TID) | CUTANEOUS | 0 refills | Status: DC | PRN

## 2022-05-03 NOTE — Telephone Encounter (Signed)
Jack [name], the patient is requesting for the lidocaine to be refilled. Ok to refill?

## 2022-05-04 ENCOUNTER — Encounter: Payer: Self-pay | Admitting: *Deleted

## 2022-05-04 ENCOUNTER — Telehealth: Payer: Self-pay | Admitting: *Deleted

## 2022-05-04 NOTE — Telephone Encounter (Signed)
Athen Pearletha Alfred Key: [id number] - PA Case ID: [id number] - Rx #: [id number] Pa submitted for lidocaine ointment.

## 2022-05-04 NOTE — Telephone Encounter (Signed)
-----   Message from Secundino Ginger sent at 05/03/2022 12:51 PM EDT ----- Regarding: Proctor sent to his chart.

## 2022-05-04 NOTE — Telephone Encounter (Signed)
Jack [name] (Key: [id number]) - [id number] Lidocaine 5% ointment Status: PA Response - ApprovedCreated: August 21st, 2023 [phone]: August 22nd, 2023

## 2022-05-06 ENCOUNTER — Other Ambulatory Visit: Payer: Self-pay | Admitting: Pharmacist

## 2022-05-06 ENCOUNTER — Other Ambulatory Visit: Payer: Self-pay | Admitting: Hospice and Palliative Medicine

## 2022-05-06 ENCOUNTER — Inpatient Hospital Stay: Payer: PPO | Admitting: Pharmacist

## 2022-05-06 ENCOUNTER — Telehealth: Payer: Self-pay | Admitting: Hospice and Palliative Medicine

## 2022-05-06 ENCOUNTER — Encounter: Payer: Self-pay | Admitting: Pharmacist

## 2022-05-06 ENCOUNTER — Inpatient Hospital Stay: Payer: PPO

## 2022-05-06 VITALS — BP 154/80 | HR 49 | Temp 98.3°F | Resp 16 | Ht 68.0 in | Wt 175.8 lb

## 2022-05-06 DIAGNOSIS — C9000 Multiple myeloma not having achieved remission: Secondary | ICD-10-CM

## 2022-05-06 LAB — COMPREHENSIVE METABOLIC PANEL
ALT: 12 U/L (ref 0–44)
AST: 22 U/L (ref 15–41)
Albumin: 3.7 g/dL (ref 3.5–5.0)
Alkaline Phosphatase: 63 U/L (ref 38–126)
Anion gap: 7 (ref 5–15)
BUN: 34 mg/dL — ABNORMAL HIGH (ref 8–23)
CO2: 24 mmol/L (ref 22–32)
Calcium: 9 mg/dL (ref 8.9–10.3)
Chloride: 107 mmol/L (ref 98–111)
Creatinine, Ser: 1.18 mg/dL (ref 0.61–1.24)
GFR, Estimated: >60 mL/min (ref >60)
Glucose, Bld: 141 mg/dL — ABNORMAL HIGH (ref 70–99)
Potassium: 4.1 mmol/L (ref 3.5–5.1)
Sodium: 138 mmol/L (ref 135–145)
Total Bilirubin: 0.5 mg/dL (ref 0.3–1.2)
Total Protein: 6.3 g/dL — ABNORMAL LOW (ref 6.5–8.1)

## 2022-05-06 LAB — CBC WITH DIFFERENTIAL/PLATELET
Abs Immature Granulocytes: 0.03 10*3/uL (ref 0.00–0.07)
Basophils Absolute: 0 10*3/uL (ref 0.0–0.1)
Basophils Relative: 0 %
Eosinophils Absolute: 0 10*3/uL (ref 0.0–0.5)
Eosinophils Relative: 0 %
HCT: 38.9 % — ABNORMAL LOW (ref 39.0–52.0)
Hemoglobin: 12.7 g/dL — ABNORMAL LOW (ref 13.0–17.0)
Immature Granulocytes: 1 %
Lymphocytes Relative: 19 %
Lymphs Abs: 1.1 10*3/uL (ref 0.7–4.0)
MCH: 32 pg (ref 26.0–34.0)
MCHC: 32.6 g/dL (ref 30.0–36.0)
MCV: 98 fL (ref 80.0–100.0)
Monocytes Absolute: 1 10*3/uL (ref 0.1–1.0)
Monocytes Relative: 17 %
Neutro Abs: 3.7 10*3/uL (ref 1.7–7.7)
Neutrophils Relative %: 63 %
Platelets: 100 10*3/uL — ABNORMAL LOW (ref 150–400)
RBC: 3.97 MIL/uL — ABNORMAL LOW (ref 4.22–5.81)
RDW: 15.6 % — ABNORMAL HIGH (ref 11.5–15.5)
WBC: 5.8 10*3/uL (ref 4.0–10.5)
nRBC: 0 % (ref 0.0–0.2)

## 2022-05-06 MED ORDER — DENOSUMAB 120 MG/1.7ML ~~LOC~~ SOLN
120.0000 mg | Freq: Once | SUBCUTANEOUS | Status: AC
  Administered 2022-05-06: 120 mg via SUBCUTANEOUS
  Filled 2022-05-06: qty 1.7

## 2022-05-06 MED ORDER — LIDOCAINE 5 % EX OINT: TOPICAL_OINTMENT | Freq: Three times a day (TID) | CUTANEOUS | 2 refills | Status: DC | PRN

## 2022-05-06 MED ORDER — MORPHINE SULFATE ER 15 MG PO TBCR: 15.0000 mg | EXTENDED_RELEASE_TABLET | Freq: Two times a day (BID) | ORAL | 0 refills | Status: DC

## 2022-05-06 NOTE — Progress Notes (Signed)
[facility]  Telephone:([phone] [phone]  Patient Care Team: [name], MD as PCP - General (Family Medicine) End, [name], MD as PCP - Cardiology (Cardiology) [name], MD (Endocrinology) Lloyd Huger, MD as Consulting Physician (Hematology and Oncology)   Name of the patient: Jack [name]  [id number]  08-20-1933   Date of visit: 05/06/22  HPI: Patient is a 88 y.o. male with newly diagnosed multiple myeloma. Currently treated with Revlimid (lenalidomide) and dexamethasone. Patient was initiated on an all oral regimen because he was unable to physically make it into clinic at the time treatment due to his disease/performance status. His status improved he was able to come back to inperson appts on 07/14/21.  Reason for Consult: Oral chemotherapy follow-up for lenalidomide therapy.   PAST MEDICAL HISTORY: Past Medical History:  Diagnosis Date   Ascending aortic aneurysm ([place])    a. 12/2020 Echo: Asc Ao 57m, Ao root 428m   CKD (chronic kidney disease), stage III (HCC)    Diastolic dysfunction    a. 12/2020 Echo: EF 50-55%, no rwma, mild LVH, GrI DD, nl RV fxn, mild AI. Asc Ao 4875mAo root 28m85m Elevated prostate specific antigen (PSA)    has been 7 for a year    GERD (gastroesophageal reflux disease)    History of colon polyps 2008   [facility] History of kidney stones    Hyperlipidemia    Hypertension    Myeloma (HCC)[place] PAF (paroxysmal atrial fibrillation) (HCC)[place] a. 01/2021-->Eliquis (CHA2DS2VASc = 3-4).   Pain    Prostate hypertrophy    diagnosed at age 47 d31 to hematospermia    HEMATOLOGY/ONCOLOGY HISTORY:  Oncology History  Kappa light chain myeloma (HCC)[place]/30/2022 Initial Diagnosis   Kappa light chain myeloma (HCC)[place]/15/2022 - 04/06/2021 Chemotherapy   Patient is on Treatment Plan : MYELOMA NON-TRANSPLANT CANDIDATES VRd SQ q21d      04/16/2021 Cancer Staging    Staging form: Plasma Cell Myeloma and Plasma Cell Disorders, AJCC 8th Edition - Clinical stage from 04/16/2021: Albumin (g/dL): 3.8, ISS: Stage II, High-risk cytogenetics: Absent, LDH: Unknown - Signed by FinnLloyd Huger on 04/16/2021 Albumin range (g/dL): Greater than or equal to 3.5 Cytogenetics: t(11;14) translocation Serum calcium level: Normal Serum creatinine level: Normal Bone disease on imaging: Present     ALLERGIES:  is allergic to quinolones, amlodipine, azithromycin, lipitor [atorvastatin], lisinopril, and zetia [ezetimibe].  MEDICATIONS:  Current Outpatient Medications  Medication Sig Dispense Refill   acetaminophen (TYLENOL) 500 MG tablet Take 500-1,000 mg by mouth every 8 (eight) hours as needed for moderate pain.     Ascorbic Acid (VITAMIN C) 1000 MG tablet Take 1,000 mg by mouth in the morning and at bedtime.     Calcium Carb-Cholecalciferol (OYSTER SHELL CALCIUM W/D) 500-5 MG-MCG TABS Take 1 tablet by mouth in the morning and at bedtime.     carvedilol (COREG) 25 MG tablet Take 1 tablet (25 mg total) by mouth 2 (two) times daily with a meal. 60 tablet 1   dexamethasone (DECADRON) 4 MG tablet TAKE 5 TABLETS BY MOUTH ONE TIME PER WEEK (Patient taking differently: Take 20 mg by mouth every Wednesday.) 20 tablet 4   ELIQUIS 5 MG TABS tablet TAKE 1 TABLET BY MOUTH TWICE A DAY 180 tablet 0   feeding supplement (ENSURE ENLIVE / ENSURE PLUS) LIQD Take 237 mLs by mouth 3 (three) times  daily between meals. (Patient taking differently: Take 237 mLs by mouth daily with lunch.) 237 mL 12   gabapentin (NEURONTIN) 300 MG capsule TAKE 2 CAPSULES BY MOUTH 3 TIMES DAILY. (Patient taking differently: Take 600 mg by mouth 2 (two) times daily.) 180 capsule 2   lenalidomide (REVLIMID) 20 MG capsule Take 1 capsule (20 mg total) by mouth daily. Take for 21 days, then hold for 7 days. Repeat every 28 days 21 capsule 0   lidocaine (XYLOCAINE) 5 % ointment Apply topically 3 (three) times daily as  needed for mild pain or moderate pain. 50 g 2   morphine (MS CONTIN) 30 MG 12 hr tablet Take 1 tablet (30 mg total) by mouth every 12 (twelve) hours. 60 tablet 0   Multiple Vitamin (MULTIVITAMIN WITH MINERALS) TABS tablet Take 1 tablet by mouth daily.     Multiple Vitamins-Minerals (PRESERVISION AREDS PO) Take 1 capsule by mouth in the morning and at bedtime.     naloxone (NARCAN) nasal spray 4 mg/0.1 mL SPRAY 1 SPRAY INTO ONE NOSTRIL AS DIRECTED FOR OPIOID OVERDOSE (TURN PERSON ON SIDE AFTER DOSE. IF NO RESPONSE IN 2-3 MINUTES OR PERSON RESPONDS BUT RELAPSES, REPEAT USING A NEW SPRAY DEVICE AND SPRAY INTO THE OTHER NOSTRIL. CALL 911 AFTER USE.) * EMERGENCY USE ONLY * 1 each 0   ondansetron (ZOFRAN) 8 MG tablet Take 1 tablet (8 mg total) by mouth every 8 (eight) hours as needed for nausea or vomiting. (Patient not taking: Reported on 04/27/2022) 60 tablet 2   polyethylene glycol (MIRALAX / GLYCOLAX) 17 g packet Take 17 g by mouth 2 (two) times daily. (Patient taking differently: Take 17 g by mouth daily.)  0   potassium citrate (UROCIT-K) 10 MEQ (1080 MG) SR tablet TAKE 1 TABLET (10 MEQ TOTAL) BY MOUTH 3 (THREE) TIMES DAILY WITH MEALS. 270 tablet 2   senna-docusate (SENOKOT-S) 8.6-50 MG tablet Take 1 tablet by mouth 2 (two) times daily. 30 tablet 0   tamsulosin (FLOMAX) 0.4 MG CAPS capsule TAKE 1 CAPSULE BY MOUTH EVERY DAY 90 capsule 1   No current facility-administered medications for this visit.    VITAL SIGNS: There were no vitals taken for this visit. There were no vitals filed for this visit.  Estimated body mass index is 26.73 kg/m as calculated from the following:   Height as of an earlier encounter on 05/06/22: _0  (1.727 m).   Weight as of an earlier encounter on 05/06/22: 79.7 kg (175 lb 12.8 oz).  LABS: CBC:    Component Value Date/Time   WBC 5.8 05/06/2022 1357   HGB 12.7 (L) 05/06/2022 1357   HGB 11.0 (L) 02/24/2021 1014   HCT 38.9 (L) 05/06/2022 1357   HCT 35.3 (L)  02/24/2021 1014   PLT 100 (L) 05/06/2022 1357   PLT 249 02/24/2021 1014   MCV 98.0 05/06/2022 1357   MCV 96 02/24/2021 1014   MCV 90 01/24/2013 0819   NEUTROABS 3.7 05/06/2022 1357   NEUTROABS 7.4 (H) 01/24/2013 0819   LYMPHSABS 1.1 05/06/2022 1357   LYMPHSABS 1.2 01/24/2013 0819   MONOABS 1.0 05/06/2022 1357   MONOABS 0.3 01/24/2013 0819   EOSABS 0.0 05/06/2022 1357   EOSABS 0.0 01/24/2013 0819   BASOSABS 0.0 05/06/2022 1357   BASOSABS 0.0 01/24/2013 0819   Comprehensive Metabolic Panel:    Component Value Date/Time   NA 138 05/06/2022 1357   NA 136 02/24/2021 1014   NA 138 01/24/2013 0819   K 4.1 05/06/2022 1357  K 3.8 01/24/2013 0819   CL 107 05/06/2022 1357   CL 105 01/24/2013 0819   CO2 24 05/06/2022 1357   CO2 25 01/24/2013 0819   BUN 34 (H) 05/06/2022 1357   BUN 23 02/24/2021 1014   BUN 20 (H) 01/24/2013 0819   CREATININE 1.18 05/06/2022 1357   CREATININE 1.52 (H) 01/24/2013 0819   GLUCOSE 141 (H) 05/06/2022 1357   GLUCOSE 171 (H) 01/24/2013 0819   CALCIUM 9.0 05/06/2022 1357   CALCIUM 10.4 (H) 02/19/2021 1229   AST 22 05/06/2022 1357   AST 18 01/24/2013 0819   ALT 12 05/06/2022 1357   ALT 15 01/24/2013 0819   ALKPHOS 63 05/06/2022 1357   ALKPHOS 74 01/24/2013 0819   BILITOT 0.5 05/06/2022 1357   BILITOT 0.4 02/24/2021 1014   BILITOT 0.5 01/24/2013 0819   PROT 6.3 (L) 05/06/2022 1357   PROT 6.2 02/24/2021 1014   PROT 6.3 (L) 01/24/2013 0819   ALBUMIN 3.7 05/06/2022 1357   ALBUMIN 4.5 02/24/2021 1014   ALBUMIN 3.6 01/24/2013 0819     Present during today's visit: Patient and his wife Jack [name]  Assessment and Plan: CBC/BMP reviewed with patient and wife, okay to continue with lenalidomide 46m, 21 days on/7off and dexamethasone 251mweekly Patient okay to receive Xgeva today as scheduled   Oral Chemotherapy Side Effect/Intolerance:  No reported N/V, diarrhea, constipation, or fatigue.   Other: Patient requested a decrease in his morphine pain  medication, down to 1539mosing Reached out to [employer] about this change Patient is requesting a larger tube of lidocaine ointment with more refills, he finds this helpful for his leg pain. Rx sent to his local pharmacy  Oral Chemotherapy Adherence: no reported missed doses No patient barriers to medication adherence identified.   New medications: none reported  Medication Access Issues: no issues, fills Revlimid at [facility]  Patient expressed understanding and was in agreement with this plan. He also understands that He can call clinic at any time with any questions, concerns, or complaints.   Follow-up plan: RTC in 4 weeks for lab/MD/inj  Thank you for allowing me to participate in the care of this very pleasant patient.   Time Total: 15 mins  Visit consisted of counseling and education on dealing with issues of symptom management in the setting of serious and potentially life-threatening illness.Greater than 50%  of this time was spent counseling and coordinating care related to the above assessment and plan.  Signed by: AlyDarl PikesharmD, BCPS, BCOSalley SlaughterPP Hematology/Oncology Clinical Pharmacist Practitioner [place]/DB/AP [phone]/24/2023 3:10 PM

## 2022-05-06 NOTE — Telephone Encounter (Signed)
Last appointment note said schedule on same day as Jack [name]. Jack [name] has requested 9/21 off and that is when patient is seeing Jack [name]. How would you like for this appointment to be rescheduled? At this time there are no future appointments with [facility]. Please advise.  Thank you

## 2022-05-06 NOTE — Progress Notes (Signed)
Dose reduction to '15mg'$  BID per family request.

## 2022-05-07 LAB — KAPPA/LAMBDA LIGHT CHAINS
Kappa free light chain: 45.4 mg/L — ABNORMAL HIGH (ref 3.3–19.4)
Kappa, lambda light chain ratio: 4.37 — ABNORMAL HIGH (ref 0.26–1.65)
Lambda free light chains: 10.4 mg/L (ref 5.7–26.3)

## 2022-05-08 LAB — IMMUNOGLOBULINS A/E/G/M, SERUM
IgA: 174 mg/dL (ref 61–437)
IgE (Immunoglobulin E), Serum: <2 IU/mL — ABNORMAL LOW (ref 6–495)
IgG (Immunoglobin G), Serum: 424 mg/dL — ABNORMAL LOW (ref 603–1613)
IgM (Immunoglobulin M), Srm: 26 mg/dL (ref 15–143)

## 2022-05-11 ENCOUNTER — Other Ambulatory Visit: Payer: PPO

## 2022-05-11 ENCOUNTER — Ambulatory Visit: Payer: PPO

## 2022-05-11 ENCOUNTER — Ambulatory Visit: Payer: PPO | Admitting: Oncology

## 2022-05-19 ENCOUNTER — Other Ambulatory Visit: Payer: Self-pay | Admitting: Oncology

## 2022-05-19 DIAGNOSIS — C9000 Multiple myeloma not having achieved remission: Secondary | ICD-10-CM

## 2022-05-24 ENCOUNTER — Other Ambulatory Visit (HOSPITAL_COMMUNITY): Payer: Self-pay | Admitting: Interventional Radiology

## 2022-05-24 DIAGNOSIS — Z8781 Personal history of (healed) traumatic fracture: Secondary | ICD-10-CM

## 2022-05-24 DIAGNOSIS — M549 Dorsalgia, unspecified: Secondary | ICD-10-CM

## 2022-05-25 ENCOUNTER — Ambulatory Visit
Admission: RE | Admit: 2022-05-25 | Discharge: 2022-05-25 | Disposition: A | Discharge status: Home / Self Care | Payer: PPO | Source: Ambulatory Visit | Attending: Interventional Radiology | Admitting: Interventional Radiology

## 2022-05-25 ENCOUNTER — Ambulatory Visit: Payer: PPO

## 2022-05-25 DIAGNOSIS — Z8781 Personal history of (healed) traumatic fracture: Secondary | ICD-10-CM | POA: Diagnosis not present

## 2022-05-25 DIAGNOSIS — M545 Low back pain, unspecified: Secondary | ICD-10-CM | POA: Diagnosis not present

## 2022-05-25 DIAGNOSIS — M549 Dorsalgia, unspecified: Secondary | ICD-10-CM | POA: Diagnosis not present

## 2022-05-26 ENCOUNTER — Telehealth: Payer: Self-pay | Admitting: Student

## 2022-05-26 ENCOUNTER — Ambulatory Visit: Payer: PPO | Admitting: Nurse Practitioner

## 2022-05-26 DIAGNOSIS — Z515 Encounter for palliative care: Secondary | ICD-10-CM | POA: Diagnosis not present

## 2022-05-26 DIAGNOSIS — G8929 Other chronic pain: Secondary | ICD-10-CM

## 2022-05-26 DIAGNOSIS — R531 Weakness: Secondary | ICD-10-CM

## 2022-05-26 DIAGNOSIS — R630 Anorexia: Secondary | ICD-10-CM | POA: Diagnosis not present

## 2022-05-26 DIAGNOSIS — C9000 Multiple myeloma not having achieved remission: Secondary | ICD-10-CM

## 2022-05-26 NOTE — Progress Notes (Signed)
Patient ID: Jack [name], male   DOB: 02/05/1933, 88 y.o.   MRN: [id number]  Patient underwent additional MR Thoracic and Lumbar Spine yesterday for new back pain after recent procedure.  Imaging reviewed by Dr. Estanislado Pandy who notes lesions appear to be stable with no new lesions identified.  No new fractures.  Results communicated to patient and his wife.   Encouraged patient to continue with care/follow-up with Oncology at this time.   Brynda Greathouse, MS RD PA-C

## 2022-05-27 ENCOUNTER — Other Ambulatory Visit: Payer: Self-pay | Admitting: Pharmacist

## 2022-05-27 ENCOUNTER — Encounter: Payer: Self-pay | Admitting: Nurse Practitioner

## 2022-05-27 DIAGNOSIS — C9000 Multiple myeloma not having achieved remission: Secondary | ICD-10-CM

## 2022-05-27 MED ORDER — LENALIDOMIDE 20 MG PO CAPS: 20.0000 mg | ORAL_CAPSULE | Freq: Every day | ORAL | 0 refills | Status: DC

## 2022-05-27 NOTE — Progress Notes (Signed)
[facility] Consult Note Telephone: [phone]  Fax: [phone]    Date of encounter: 05/27/22 9:30 AM PATIENT NAME: Jack [name] [address] [place] [address]   [phone] (home)  DOB: 02-05-1933 MRN: [id number] PRIMARY CARE PROVIDER:    [name], MD,  [place] [address] [phone]  RESPONSIBLE PARTY:    Contact Information     Name Relation Home Work [place] Spouse [phone]  [phone]          Due to the COVID-19 crisis, this visit was done via telemedicine from my office and it was initiated and consent by this patient and or family.   I connected with Jack [name] and Jack [name] OR PROXY on 03/17/22 by a telephone as video not available enabled telemedicine application and verified that I am speaking with the correct person.   I discussed the limitations of evaluation and management by telemedicine. The patient expressed understanding and agreed to proceed. Palliative Care was asked to follow this patient by consultation request of  Jack Pitch, MD to address advance care planning and complex medical decision making. This is a follow up visit.                                  ASSESSMENT AND PLAN / RECOMMENDATIONS:  Symptom Management/Plan: Symptom Management/Plan: 1. Advance Care Planning; DNR; Currently taking oral chemotherapy, doing well. Wishes to continue    2. Chronic pain/generalized weakness secondary to compression fx's, multiple myeloma; continue with MsContin 38m q12 hrs.    3. Anorexia; varies, talked about weight gain, nutrition; supplements;  11/17/2021 weight 165.3 lbs 03/11/2022 weight 175 lbs\BMI 26.73 04/08/2022 weight 174 lbs/BMI 26.61  4. Palliative care encounter; Palliative care encounter; Palliative medicine team will continue to support patient, patient's family, and medical team. Visit consisted of counseling and education dealing  with the complex and emotionally intense issues of symptom management and palliative care in the setting of serious and potentially life-threatening illness   Cancer Staging  [place]appa light chain myeloma ([place] Staging form: Plasma Cell Myeloma and Plasma Cell Disorders, AJCC 8th Edition - Clinical stage from 04/16/2021: Albumin (g/dL): 3.8, ISS: Stage II, High-risk cytogenetics: Absent, LDH: Unknown - Signed by FLloyd Huger MD on 04/16/2021 Albumin range (g/dL): Greater than or equal to 3.5 Cytogenetics: t(11;14) translocation Serum calcium level: Normal Serum creatinine level: Normal Bone disease on imaging: Present   Noted:  Low normal heart squeezing function.  Mildly leaky mitral valve.  Mild to moderately leaky aortic valve. Dilated aorta with increase in size to 51 mm of the ascending aorta.  Aortic root measures 40 mm while the aortic arch measures 46 mm with increase in size of the ascending aorta, Will have CT angiogram of the chest to better evaluate his aorta.  Kidney function was stable on May 30.  Pending CTA scheduled, and pending appointment with thoracic surgery to establish care following CTA.   Follow up Palliative Care Visit: Palliative care will continue to follow for complex medical decision making, advance care planning, and clarification of goals. Return 8 weeks or prn.   I spent 33 minutes providing this consultation. More than 50% of the time in this consultation was spent in counseling and care coordination. PPS: 50% Chief Complaint: Follow up palliative consult for complex medical decision making, address goals, manage ongoing symptoms   HISTORY  OF PRESENT ILLNESS:  Jack [name] is a 88 y.o. year old male  with multiple medical problems including hypertension, CKD stage IIIb, A. fib on Eliquis, and anemia.  I called Jack [name] for t/u PC visit telemedicine telephonic as video not available. Jack [name] in agreement.   05/25/2022 MRI Thoracic  spine IMPRESSION: 1. Remote compression fractures of T11, T12, L1, L2 and L3, status post vertebral augmentation. No acute abnormality of the thoracic or lumbar spine. 2. Previously described enhancing lesions at T8 and T9 are poorly characterized without contrast. There unenhanced appearance is unchanged. 3. No spinal canal or neural foraminal stenosis.  05/25/2022 MRI Lumbar spine IMPRESSION: 1. Remote compression fractures of T11, T12, L1, L2 and L3, status post vertebral augmentation. No acute abnormality of the thoracic or lumbar spine. 2. Previously described enhancing lesions at T8 and T9 are poorly characterized without contrast. There unenhanced appearance is unchanged. 3. No spinal canal or neural foraminal stenosis.  MRI thoracic 03/25/2022 IMPRESSION: 1. Subacute pathologic fracture of the T11 vertebral body with approximately 30% vertebral body height loss where there is a small enhancing lesion present within the vertebral body anteriorly. 2. Additional enhancing lesions within the T8 and T9 vertebral bodies. Overall, size and number of bone lesions has significantly decreased when compared with the previous MRI from 2022. Findings compatible with a favorable response to treatment. 3. Chronic compression fractures of T12 and L1 status post cement augmentation. 4. Similar mild degenerative changes throughout the thoracic spine with mild bilateral foraminal narrowing at T10-11 and T11-12. No canal stenosis at any level. 5. Known ascending thoracic aortic aneurysm is suboptimally assessed by protocol.   MRI L-S Spine 03/25/2022  IMPRESSION: 1. No acute osseous injury of the lumbar spine. 2. Incompletely visualized subacute T11 vertebral body compression fracture better evaluated on the MRI thoracic spine performed earlier same day and reported separately. 3. Chronic T12, L1, L2 and L3 vertebral body compression fractures with prior augmentation. Mild thoracolumbar  spine spondylosis.  Jack and Jack [name] Jack [name] talked about how he has been feeling. Jack Jack [name] endorses increase in pain, recent kyphoplasty without relief of pain. Continues to be able to ambulate, no recent falls, able to get out of his lift chair without assistance. We talked about adl's. We talked about appetite which has been fair. We talked about recent MRI results, lesion, medications including decadron; morphine, increase in pain levels. We talked about upcoming appointment with Dr Grayland Ormond Oncologist/J.Borders NP. We talked about appetite, sleep, daily routine. We talked about medical goals. We talked role pc in poc. Will continue to monitor, follow, appointment scheduled. Therapeutic listening, emotional support provided. Questions answered.    Medical goals reviewed. Talked about f/u PC, scheduled visit. Therapeutic listening, emotional support provided. Questions answered.    History obtained from review of EMR, discussion with Jack [name] and Jack. Martin.  I reviewed available labs, medications, imaging, studies and related documents from the EMR.  Records reviewed and summarized above.    ROS 10 point reviewed all negative except HPI   Physical Exam: deferred  Thank you for the opportunity to participate in the care of Jack. Faciane.  The palliative care team will continue to follow. Please call our office at [phone] if we can be of additional assistance.   Kurstyn Larios Ihor Gully, NP

## 2022-05-31 ENCOUNTER — Encounter: Payer: Self-pay | Admitting: Oncology

## 2022-05-31 ENCOUNTER — Other Ambulatory Visit: Payer: Self-pay

## 2022-05-31 MED ORDER — MORPHINE SULFATE ER 30 MG PO TBCR: 30.0000 mg | EXTENDED_RELEASE_TABLET | Freq: Two times a day (BID) | ORAL | 0 refills | Status: DC

## 2022-05-31 NOTE — Telephone Encounter (Signed)
Patient sent a mychart message requesting morphine be increased back to 30 mg as it was before. He is having increased back pain again. It looks like based on a mychart message on 8/31 it was ok with [employer]fin Ganja to increase.

## 2022-06-01 DIAGNOSIS — H353231 Exudative age-related macular degeneration, bilateral, with active choroidal neovascularization: Secondary | ICD-10-CM | POA: Diagnosis not present

## 2022-06-01 DIAGNOSIS — H353211 Exudative age-related macular degeneration, right eye, with active choroidal neovascularization: Secondary | ICD-10-CM | POA: Diagnosis not present

## 2022-06-01 DIAGNOSIS — H43812 Vitreous degeneration, left eye: Secondary | ICD-10-CM | POA: Diagnosis not present

## 2022-06-01 DIAGNOSIS — H353221 Exudative age-related macular degeneration, left eye, with active choroidal neovascularization: Secondary | ICD-10-CM | POA: Diagnosis not present

## 2022-06-03 ENCOUNTER — Telehealth: Payer: Self-pay

## 2022-06-03 ENCOUNTER — Inpatient Hospital Stay: Payer: PPO

## 2022-06-03 ENCOUNTER — Inpatient Hospital Stay: Payer: PPO | Admitting: Hospice and Palliative Medicine

## 2022-06-03 ENCOUNTER — Inpatient Hospital Stay: Payer: PPO | Admitting: Oncology

## 2022-06-03 ENCOUNTER — Inpatient Hospital Stay: Payer: PPO | Attending: Oncology

## 2022-06-03 ENCOUNTER — Encounter: Payer: Self-pay | Admitting: Oncology

## 2022-06-03 VITALS — BP 150/72 | HR 44 | Temp 96.1°F | Wt 178.0 lb

## 2022-06-03 DIAGNOSIS — C9000 Multiple myeloma not having achieved remission: Secondary | ICD-10-CM | POA: Insufficient documentation

## 2022-06-03 LAB — CBC WITH DIFFERENTIAL/PLATELET
Abs Immature Granulocytes: 0.02 10*3/uL (ref 0.00–0.07)
Basophils Absolute: 0 10*3/uL (ref 0.0–0.1)
Basophils Relative: 0 %
Eosinophils Absolute: 0.1 10*3/uL (ref 0.0–0.5)
Eosinophils Relative: 2 %
HCT: 38 % — ABNORMAL LOW (ref 39.0–52.0)
Hemoglobin: 12.5 g/dL — ABNORMAL LOW (ref 13.0–17.0)
Immature Granulocytes: 0 %
Lymphocytes Relative: 23 %
Lymphs Abs: 1.2 10*3/uL (ref 0.7–4.0)
MCH: 32.4 pg (ref 26.0–34.0)
MCHC: 32.9 g/dL (ref 30.0–36.0)
MCV: 98.4 fL (ref 80.0–100.0)
Monocytes Absolute: 1.2 10*3/uL — ABNORMAL HIGH (ref 0.1–1.0)
Monocytes Relative: 23 %
Neutro Abs: 2.8 10*3/uL (ref 1.7–7.7)
Neutrophils Relative %: 52 %
Platelets: 100 10*3/uL — ABNORMAL LOW (ref 150–400)
RBC: 3.86 MIL/uL — ABNORMAL LOW (ref 4.22–5.81)
RDW: 15.9 % — ABNORMAL HIGH (ref 11.5–15.5)
WBC: 5.4 10*3/uL (ref 4.0–10.5)
nRBC: 0 % (ref 0.0–0.2)

## 2022-06-03 LAB — COMPREHENSIVE METABOLIC PANEL
ALT: 11 U/L (ref 0–44)
AST: 21 U/L (ref 15–41)
Albumin: 3.7 g/dL (ref 3.5–5.0)
Alkaline Phosphatase: 62 U/L (ref 38–126)
Anion gap: 5 (ref 5–15)
BUN: 34 mg/dL — ABNORMAL HIGH (ref 8–23)
CO2: 25 mmol/L (ref 22–32)
Calcium: 8.8 mg/dL — ABNORMAL LOW (ref 8.9–10.3)
Chloride: 108 mmol/L (ref 98–111)
Creatinine, Ser: 1.17 mg/dL (ref 0.61–1.24)
GFR, Estimated: >60 mL/min (ref >60)
Glucose, Bld: 100 mg/dL — ABNORMAL HIGH (ref 70–99)
Potassium: 4.5 mmol/L (ref 3.5–5.1)
Sodium: 138 mmol/L (ref 135–145)
Total Bilirubin: 0.5 mg/dL (ref 0.3–1.2)
Total Protein: 6.1 g/dL — ABNORMAL LOW (ref 6.5–8.1)

## 2022-06-03 MED ORDER — DENOSUMAB 120 MG/1.7ML ~~LOC~~ SOLN
120.0000 mg | Freq: Once | SUBCUTANEOUS | Status: AC
  Administered 2022-06-03: 120 mg via SUBCUTANEOUS
  Filled 2022-06-03: qty 1.7

## 2022-06-03 NOTE — Telephone Encounter (Signed)
Apt made for 930 am on 9/25. I spoke to patient's wife.

## 2022-06-03 NOTE — Telephone Encounter (Signed)
Patient would like Josh to call him when he returns back to discuss pain management.

## 2022-06-03 NOTE — Progress Notes (Signed)
[place]  [phone] Fax:(336) [phone]  ID: [name] OB: [birth year]  MR#: [id number]  WLN#:[id number]  Patient Care Team: [name], MD as PCP - General (Family Medicine) End, [name], MD as PCP - Cardiology (Cardiology) [name], MD (Endocrinology) Lloyd Huger, MD as Consulting Physician (Hematology and Oncology)   CHIEF COMPLAINT: Stage II kappa chain myeloma.  INTERVAL HISTORY: Patient returns to clinic today for repeat laboratory, further evaluation, and continuation of Revlimid and Xgeva.  He continues to have significant back pain, but otherwise feels well.  He has no neurologic complaints.  He denies any recent fevers or illnesses.  He has a good appetite and denies weight loss.  He has no chest pain, shortness of breath, cough, or hemoptysis.  He denies any nausea, vomiting, constipation, or diarrhea.  He has no urinary complaints.  Patient offers no further specific complaints today.  REVIEW OF SYSTEMS:   Review of Systems  Constitutional: Negative.  Negative for fever and malaise/fatigue.  Respiratory: Negative.  Negative for cough, hemoptysis and shortness of breath.   Cardiovascular: Negative.  Negative for chest pain and leg swelling.  Gastrointestinal: Negative.  Negative for abdominal pain.  Genitourinary: Negative.  Negative for dysuria and flank pain.  Musculoskeletal:  Positive for back pain. Negative for joint pain.  Skin: Negative.  Negative for rash.  Neurological: Negative.  Negative for dizziness, focal weakness, weakness and headaches.  Psychiatric/Behavioral: Negative.  The patient is not nervous/anxious.     As per HPI. Otherwise, a complete review of systems is negative.  PAST MEDICAL HISTORY: Past Medical History:  Diagnosis Date   Ascending aortic aneurysm ([place])    a. 12/2020 Echo: Asc Ao 37m, Ao root 470m   CKD (chronic kidney disease), stage III (HCC)    Diastolic dysfunction     a. 12/2020 Echo: EF 50-55%, no rwma, mild LVH, GrI DD, nl RV fxn, mild AI. Asc Ao 4850mAo root 36m13m Elevated prostate specific antigen (PSA)    has been 7 for a year    GERD (gastroesophageal reflux disease)    History of colon polyps 2008   [facility] History of kidney stones    Hyperlipidemia    Hypertension    Myeloma (HCC)[place] PAF (paroxysmal atrial fibrillation) (HCC)[place] a. 01/2021-->Eliquis (CHA2DS2VASc = 3-4).   Pain    Prostate hypertrophy    diagnosed at age 29 d100 to hematospermia    PAST SURGICAL HISTORY: Past Surgical History:  Procedure Laterality Date   CATARACT EXTRACTION W/PHACO Left 01/10/2018   Procedure: CATARACT EXTRACTION PHACO AND INTRAOCULAR LENS PLACEMENT (IOC)[place];  Location: [facility] ORS;  Service: Ophthalmology;  Laterality: Left;  US 0[name]24.8 AP% 14.9 CDE 3.68 Fluid pack lot # [id number] CATARACT EXTRACTION W/PHACO Right 01/25/2018   Procedure: CATARACT EXTRACTION PHACO AND INTRAOCULAR LENS PLACEMENT (IOC);  Surgeon: PorfBirder Robson;  Location: [facility] ORS;  Service: Ophthalmology;  Laterality: Right;  US 0[name]42 AP% 10.8 CDE 4.59 Fluid pack lot # [id number] COLON SURGERY     CYSTOSCOPY W/ URETERAL STENT PLACEMENT Right 10/16/2017   Procedure: right  URETERAL STENT PLACEMENT,cystoscopy bilateral stent removal,rretrograde;  Surgeon: StoiAbbie Sons;  Location: [facility] ORS;  Service: Urology;  Laterality: Right;   CYSTOSCOPY/URETEROSCOPY/HOLMIUM LASER/STENT PLACEMENT Right 12/16/2020   Procedure: CYSTOSCOPY/URETEROSCOPY/HOLMIUM LASER/STENT PLACEMENT;  Surgeon: StoiAbbie Sons;  Location: [facility] ORS;  Service: Urology;  Laterality:  Right;   EXTRACORPOREAL SHOCK WAVE LITHOTRIPSY Right 12/11/2020   Procedure: EXTRACORPOREAL SHOCK WAVE LITHOTRIPSY (ESWL);  Surgeon: Abbie Sons, MD;  Location: [facility] ORS;  Service: Urology;  Laterality: Right;   IR BONE TUMOR(S)RF ABLATION  04/16/2022   IR KYPHO EA ADDL LEVEL THORACIC OR  LUMBAR  02/02/2021   IR KYPHO LUMBAR INC FX REDUCE BONE BX UNI/BIL CANNULATION INC/IMAGING  02/02/2021   IR KYPHO THORACIC WITH BONE BIOPSY  04/15/2022   KIDNEY STONE SURGERY     KYPHOPLASTY N/A 03/12/2021   Procedure: Hewitt Shorts, L3;  Surgeon: Hessie Knows, MD;  Location: [facility] ORS;  Service: Orthopedics;  Laterality: N/A;   RESECTION SOFT TISSUE TUMOR LEG / ANKLE RADICAL  jan 2009   [facility],  right thigh/knee , nonmalignant   SMALL INTESTINE SURGERY  1946   implaed on picket fence, punctured stomach   TONSILLECTOMY      FAMILY HISTORY: Family History  Problem Relation Age of Onset   Hypertension Father    Hyperlipidemia Father    Cancer Sister        thyroid - dx in late 20's    ADVANCED DIRECTIVES (Y/N):  N  HEALTH MAINTENANCE: Social History   Tobacco Use   Smoking status: Former    Types: Cigarettes    Quit date: 07/05/1965    Years since quitting: 56.9   Smokeless tobacco: Never  Vaping Use   Vaping Use: Never used  Substance Use Topics   Alcohol use: Yes    Alcohol/week: 1.0 standard drink of alcohol    Types: 1 Standard drinks or equivalent per week    Comment: occassionaly   Drug use: No     Colonoscopy:  PAP:  Bone density:  Lipid panel:  Allergies  Allergen Reactions   Quinolones     Aortic dilation contraindicates FQ   Amlodipine Swelling and Other (See Comments)    LE edema   Azithromycin Nausea And Vomiting   Lipitor [Atorvastatin]     Muscle pain in RT Leg    Lisinopril Cough   Zetia [Ezetimibe]     Pain in legs     Current Outpatient Medications  Medication Sig Dispense Refill   acetaminophen (TYLENOL) 500 MG tablet Take 500-1,000 mg by mouth every 8 (eight) hours as needed for moderate pain.     Ascorbic Acid (VITAMIN C) 1000 MG tablet Take 1,000 mg by mouth in the morning and at bedtime.     Calcium Carb-Cholecalciferol (OYSTER SHELL CALCIUM W/D) 500-5 MG-MCG TABS Take 1 tablet by mouth in the morning and at bedtime.     carvedilol  (COREG) 25 MG tablet Take 1 tablet (25 mg total) by mouth 2 (two) times daily with a meal. 60 tablet 1   dexamethasone (DECADRON) 4 MG tablet TAKE 5 TABLETS BY MOUTH ONE TIME PER WEEK (Patient taking differently: Take 20 mg by mouth every Wednesday.) 20 tablet 4   ELIQUIS 5 MG TABS tablet TAKE 1 TABLET BY MOUTH TWICE A DAY 180 tablet 0   feeding supplement (ENSURE ENLIVE / ENSURE PLUS) LIQD Take 237 mLs by mouth 3 (three) times daily between meals. (Patient taking differently: Take 237 mLs by mouth daily with lunch.) 237 mL 12   gabapentin (NEURONTIN) 300 MG capsule TAKE 2 CAPSULES BY MOUTH 3 TIMES DAILY. (Patient taking differently: Take 600 mg by mouth 2 (two) times daily.) 180 capsule 2   lenalidomide (REVLIMID) 20 MG capsule Take 1 capsule (20 mg total) by mouth daily. Take for  21 days, then hold for 7 days. Repeat every 28 days 21 capsule 0   lidocaine (XYLOCAINE) 5 % ointment Apply topically 3 (three) times daily as needed for mild pain or moderate pain. 50 g 2   morphine (MS CONTIN) 30 MG 12 hr tablet Take 1 tablet (30 mg total) by mouth every 12 (twelve) hours. 60 tablet 0   Multiple Vitamin (MULTIVITAMIN WITH MINERALS) TABS tablet Take 1 tablet by mouth daily.     Multiple Vitamins-Minerals (PRESERVISION AREDS PO) Take 1 capsule by mouth in the morning and at bedtime.     naloxone (NARCAN) nasal spray 4 mg/0.1 mL SPRAY 1 SPRAY INTO ONE NOSTRIL AS DIRECTED FOR OPIOID OVERDOSE (TURN PERSON ON SIDE AFTER DOSE. IF NO RESPONSE IN 2-3 MINUTES OR PERSON RESPONDS BUT RELAPSES, REPEAT USING A NEW SPRAY DEVICE AND SPRAY INTO THE OTHER NOSTRIL. CALL 911 AFTER USE.) * EMERGENCY USE ONLY * 1 each 0   polyethylene glycol (MIRALAX / GLYCOLAX) 17 g packet Take 17 g by mouth 2 (two) times daily. (Patient taking differently: Take 17 g by mouth daily.)  0   potassium citrate (UROCIT-K) 10 MEQ (1080 MG) SR tablet TAKE 1 TABLET (10 MEQ TOTAL) BY MOUTH 3 (THREE) TIMES DAILY WITH MEALS. 270 tablet 2   senna-docusate  (SENOKOT-S) 8.6-50 MG tablet Take 1 tablet by mouth 2 (two) times daily. 30 tablet 0   tamsulosin (FLOMAX) 0.4 MG CAPS capsule TAKE 1 CAPSULE BY MOUTH EVERY DAY 90 capsule 1   ondansetron (ZOFRAN) 8 MG tablet Take 1 tablet (8 mg total) by mouth every 8 (eight) hours as needed for nausea or vomiting. (Patient not taking: Reported on 04/27/2022) 60 tablet 2   No current facility-administered medications for this visit.    OBJECTIVE: Vitals:   06/03/22 1418  BP: (!) 150/72  Pulse: (!) 44  Temp: (!) 96.1 F (35.6 C)      Body mass index is 27.06 kg/m.    ECOG FS:1 - Symptomatic but completely ambulatory  General: Well-developed, well-nourished, no acute distress. Eyes: Pink conjunctiva, anicteric sclera. HEENT: Normocephalic, moist mucous membranes. Lungs: No audible wheezing or coughing. Heart: Regular rate and rhythm. Abdomen: Soft, nontender, no obvious distention. Musculoskeletal: No edema, cyanosis, or clubbing. Neuro: Alert, answering all questions appropriately. Cranial nerves grossly intact. Skin: No rashes or petechiae noted. Psych: Normal affect.  LAB RESULTS:  Lab Results  Component Value Date   NA 138 06/03/2022   K 4.5 06/03/2022   CL 108 06/03/2022   CO2 25 06/03/2022   GLUCOSE 100 (H) 06/03/2022   BUN 34 (H) 06/03/2022   CREATININE 1.17 06/03/2022   CALCIUM 8.8 (L) 06/03/2022   PROT 6.1 (L) 06/03/2022   ALBUMIN 3.7 06/03/2022   AST 21 06/03/2022   ALT 11 06/03/2022   ALKPHOS 62 06/03/2022   BILITOT 0.5 06/03/2022   GFRNONAA >60 06/03/2022   GFRAA 29 (L) 10/16/2017    Lab Results  Component Value Date   WBC 5.4 06/03/2022   NEUTROABS 2.8 06/03/2022   HGB 12.5 (L) 06/03/2022   HCT 38.0 (L) 06/03/2022   MCV 98.4 06/03/2022   PLT 100 (L) 06/03/2022     STUDIES: MR THORACIC SPINE WO CONTRAST  Result Date: 05/26/2022 CLINICAL DATA:  Mid to low back pain. History of multiple myeloma. Prior kyphoplasty. EXAM: MRI THORACIC AND LUMBAR SPINE WITHOUT  CONTRAST TECHNIQUE: Multiplanar and multiecho pulse sequences of the thoracic and lumbar spine were obtained without intravenous contrast. COMPARISON:  None Available. FINDINGS:  MRI THORACIC SPINE FINDINGS Alignment:  Physiologic. Vertebrae: Remote compression fractures of T11 and T12, status post vertebral augmentation. No acute fracture. Unchanged unenhanced appearance of lesions at T8-T9. Cord:  Normal signal and morphology. Paraspinal and other soft tissues: Negative. Disc levels: No spinal canal stenosis MRI LUMBAR SPINE FINDINGS Segmentation:  Standard Alignment:  Normal Vertebrae: Remote compression deformities at L1, L2 and L3, status post augmentation. No acute abnormality. No bone marrow abnormality at the other levels. Conus medullaris and cauda equina: Conus extends to the L1-2 level. Conus and cauda equina appear normal. Paraspinal and other soft tissues: Negative Disc levels: L1-L2: Normal disc space and facet joints. No spinal canal stenosis. No neural foraminal stenosis. L2-L3: Normal disc space and facet joints. No spinal canal stenosis. No neural foraminal stenosis. L3-L4: Small disc bulge. No spinal canal stenosis. No neural foraminal stenosis. L4-L5: Normal disc space and facet joints. No spinal canal stenosis. No neural foraminal stenosis. L5-S1: Normal disc space and facet joints. No spinal canal stenosis. No neural foraminal stenosis. Visualized sacrum: Normal. IMPRESSION: 1. Remote compression fractures of T11, T12, L1, L2 and L3, status post vertebral augmentation. No acute abnormality of the thoracic or lumbar spine. 2. Previously described enhancing lesions at T8 and T9 are poorly characterized without contrast. There unenhanced appearance is unchanged. 3. No spinal canal or neural foraminal stenosis. Electronically Signed   By: Ulyses Jarred M.D.   On: 05/26/2022 03:32   MR LUMBAR SPINE WO CONTRAST  Result Date: 05/26/2022 CLINICAL DATA:  Mid to low back pain. History of multiple  myeloma. Prior kyphoplasty. EXAM: MRI THORACIC AND LUMBAR SPINE WITHOUT CONTRAST TECHNIQUE: Multiplanar and multiecho pulse sequences of the thoracic and lumbar spine were obtained without intravenous contrast. COMPARISON:  None Available. FINDINGS: MRI THORACIC SPINE FINDINGS Alignment:  Physiologic. Vertebrae: Remote compression fractures of T11 and T12, status post vertebral augmentation. No acute fracture. Unchanged unenhanced appearance of lesions at T8-T9. Cord:  Normal signal and morphology. Paraspinal and other soft tissues: Negative. Disc levels: No spinal canal stenosis MRI LUMBAR SPINE FINDINGS Segmentation:  Standard Alignment:  Normal Vertebrae: Remote compression deformities at L1, L2 and L3, status post augmentation. No acute abnormality. No bone marrow abnormality at the other levels. Conus medullaris and cauda equina: Conus extends to the L1-2 level. Conus and cauda equina appear normal. Paraspinal and other soft tissues: Negative Disc levels: L1-L2: Normal disc space and facet joints. No spinal canal stenosis. No neural foraminal stenosis. L2-L3: Normal disc space and facet joints. No spinal canal stenosis. No neural foraminal stenosis. L3-L4: Small disc bulge. No spinal canal stenosis. No neural foraminal stenosis. L4-L5: Normal disc space and facet joints. No spinal canal stenosis. No neural foraminal stenosis. L5-S1: Normal disc space and facet joints. No spinal canal stenosis. No neural foraminal stenosis. Visualized sacrum: Normal. IMPRESSION: 1. Remote compression fractures of T11, T12, L1, L2 and L3, status post vertebral augmentation. No acute abnormality of the thoracic or lumbar spine. 2. Previously described enhancing lesions at T8 and T9 are poorly characterized without contrast. There unenhanced appearance is unchanged. 3. No spinal canal or neural foraminal stenosis. Electronically Signed   By: Ulyses Jarred M.D.   On: 05/26/2022 03:32    ASSESSMENT: Stage II Kappa chain  myeloma.  PLAN:    Stage II kappa chain myeloma: (11:14 translocation, high risk) SPEP essentially negative and immunoglobulins are within normal limits.  Patient's kappa light chains have improved to greater than 5400 down to 46.2, today's results are pending.  Cycle 1 included Velcade and Revlimid, but given his declining performance status and difficulty to get into clinic this was transitioned to Revlimid only.  Given persistent neutropenia, Revlimid was previously dose reduced to 15 mg daily, but patient is now tolerating an increased dose of 20 mg for 21 days with 7 days off.   He is also taking 20 mg dexamethasone weekly.  PET scan results from December 08, 2021 reviewed independently with no obvious evidence of progressive disease.  Patient initiated monthly Xgeva on August 11, 2021.  Proceed with Xgeva today.  Continue 20 mg Revlimid daily as above.  Return to clinic in 4 weeks for further evaluation and evaluation by clinical pharmacy and then in 8 weeks for MD evaluation and continuation of treatment.  Back pain: Chronic and unchanged.  PET scan results as above.  MRI results reviewed independently.  Patient has a referral to IR for consideration of kyphoplasty.  Continue current narcotic and gabapentin prescriptions.  Patient wishes to further discuss his treatment regimen with palliative care. Anemia: Mild.   Right flank mass: MRI results from July 13, 2021 reviewed independently with no obvious pathology on his right flank, but did reveal several fractured ribs.  Continue narcotic regimen as above. Bone lesions: Continue Xgeva as above. Neutropenia: Resolved.  Continue Revlimid 20 mg as above. Thrombocytopenia: Chronic and unchanged.  Patient's most recent platelet count is 100.  Patient expressed understanding and was in agreement with this plan. He also understands that He can call clinic at any time with any questions, concerns, or complaints.    Cancer Staging  Kappa light chain  myeloma ([place]) Staging form: Plasma Cell Myeloma and Plasma Cell Disorders, AJCC 8th Edition - Clinical stage from 04/16/2021: Albumin (g/dL): 3.8, ISS: Stage II, High-risk cytogenetics: Absent, LDH: Unknown - Signed by Lloyd Huger, MD on 04/16/2021 Albumin range (g/dL): Greater than or equal to 3.5 Cytogenetics: t(11;14) translocation Serum calcium level: Normal Serum creatinine level: Normal Bone disease on imaging: Present  Lloyd Huger, MD   06/03/2022 2:43 PM

## 2022-06-04 LAB — KAPPA/LAMBDA LIGHT CHAINS
Kappa free light chain: 39.1 mg/L — ABNORMAL HIGH (ref 3.3–19.4)
Kappa, lambda light chain ratio: 3.95 — ABNORMAL HIGH (ref 0.26–1.65)
Lambda free light chains: 9.9 mg/L (ref 5.7–26.3)

## 2022-06-05 LAB — IMMUNOGLOBULINS A/E/G/M, SERUM
IgA: 155 mg/dL (ref 61–437)
IgE (Immunoglobulin E), Serum: <2 IU/mL — ABNORMAL LOW (ref 6–495)
IgG (Immunoglobin G), Serum: 445 mg/dL — ABNORMAL LOW (ref 603–1613)
IgM (Immunoglobulin M), Srm: 36 mg/dL (ref 15–143)

## 2022-06-07 ENCOUNTER — Inpatient Hospital Stay (HOSPITAL_BASED_OUTPATIENT_CLINIC_OR_DEPARTMENT_OTHER): Payer: PPO | Admitting: Hospice and Palliative Medicine

## 2022-06-07 DIAGNOSIS — C9000 Multiple myeloma not having achieved remission: Secondary | ICD-10-CM | POA: Diagnosis not present

## 2022-06-07 DIAGNOSIS — G893 Neoplasm related pain (acute) (chronic): Secondary | ICD-10-CM | POA: Diagnosis not present

## 2022-06-07 DIAGNOSIS — Z515 Encounter for palliative care: Secondary | ICD-10-CM

## 2022-06-07 MED ORDER — MORPHINE SULFATE 15 MG PO TABS: 15.0000 mg | ORAL_TABLET | Freq: Four times a day (QID) | ORAL | 0 refills | Status: DC | PRN

## 2022-06-07 NOTE — Progress Notes (Signed)
Virtual Visit via Telephone Note  I connected with Jack [name] on 06/07/22 at  9:30 AM EDT by telephone and verified that I am speaking with the correct person using two identifiers.  Location: Patient: Home Provider: Clinic   I discussed the limitations, risks, security and privacy concerns of performing an evaluation and management service by telephone and the availability of in person appointments. I also discussed with the patient that there may be a patient responsible charge related to this service. The patient expressed understanding and agreed to proceed.   History of Present Illness: Jack [name] is a 88 y.o. male with multiple medical problems including hypertension, CKD stage IIIb, A. fib on Eliquis, and anemia.  Patient was hospitalized 01/02/2021 to 01/07/2021 with T12 compression fracture with recommendation for conservative management including use of a TLSO brace.  Patient was hospitalized again 01/29/2021 to 02/07/2021 with recurrent back pain and MR showing T12 and new L1 compression fracture.  Patient underwent T12/L1 kyphoplasty on 02/02/2021.  There was also suspicion of a left fifth rib fracture, which occurred while transitioning from the stretcher for his kyphoplasty.  Patient had mild hypercalcemia with SPEP and UPEP concerning for possible myeloma.  Patient was [facility] to [facility] for [facility]up.  He was readmitted 03/11/2021 - 03/17/2021 with worsening back pain.  CT revealed new compression fractures of L2 and L3.  Patient underwent kyphoplasty on 6/30.  He was hospitalized again 03/26/2021-04/07/2021 with severe back/flank pain.  MRI revealed T10 pathologic compression fracture.  He was started on treatment for multiple myeloma with dexamethasone/Velcade and XRT to spine.   Patient is s/p T11 kyphoplasty and ablation.    Observations/Objective: Spoke with patient and wife by phone.    MRI of the thoracic and lumbar spine on 05/25/2022 was consistent with remote  compression fractures of T11, T12, L1, L2 and L3 status post vertebral augmentation but no acute abnormalities of the thoracic or lumbar spine.  Patient continues to endorse persistent back pain.  We have previously dose reduced MS Contin from 30 mg to 15 mg every 12 hours but had to increase back to 30 mg due to worsening pain.  Patient still feels like pain is not fully controlled.  He is having early breakthrough pain prior to the next dose of MS Contin.  Patient is not taking anything for breakthrough pain at this point other than acetaminophen/ibuprofen.  Will add MS IR to long-acting morphine.  Patient is on regular bowel regimen with MiraLAX and senna and this is stimulating a bowel movement daily.  No adverse effects from pain medications reported.  Patient reiterates goal of quality of life and comfort.  Assessment and Plan: Chronic back pain secondary to multiple myeloma and history of recurrent/chronic compression fractures.  Continue MS Contin 30 mg every 12 hours.  Add MS IR 15 mg every 6 hours as needed for breakthrough pain #60.  Continue daily bowel regimen  Follow Up Instructions: Follow-up telephone visit 3 weeks   I discussed the assessment and treatment plan with the patient. The patient was provided an opportunity to ask questions and all were answered. The patient agreed with the plan and demonstrated an understanding of the instructions.   The patient was advised to call back or seek an in-person evaluation if the symptoms worsen or if the condition fails to improve as anticipated.  I provided 10 minutes of non-face-to-face time during this encounter.   Irean Hong, NP

## 2022-06-08 ENCOUNTER — Other Ambulatory Visit: Payer: Self-pay | Admitting: Oncology

## 2022-06-08 DIAGNOSIS — C9000 Multiple myeloma not having achieved remission: Secondary | ICD-10-CM

## 2022-06-10 ENCOUNTER — Emergency Department: Payer: PPO

## 2022-06-10 ENCOUNTER — Encounter: Payer: Self-pay | Admitting: Emergency Medicine

## 2022-06-10 ENCOUNTER — Inpatient Hospital Stay
Admission: EM | Admit: 2022-06-10 | Discharge: 2022-06-15 | DRG: 535 | Disposition: A | Discharge status: Skilled Nursing Facility | Payer: PPO | Attending: Internal Medicine | Admitting: Internal Medicine

## 2022-06-10 ENCOUNTER — Other Ambulatory Visit: Payer: Self-pay

## 2022-06-10 ENCOUNTER — Inpatient Hospital Stay: Payer: PPO

## 2022-06-10 DIAGNOSIS — R42 Dizziness and giddiness: Secondary | ICD-10-CM | POA: Diagnosis not present

## 2022-06-10 DIAGNOSIS — M8458XG Pathological fracture in neoplastic disease, other specified site, subsequent encounter for fracture with delayed healing: Secondary | ICD-10-CM | POA: Diagnosis present

## 2022-06-10 DIAGNOSIS — I10 Essential (primary) hypertension: Secondary | ICD-10-CM | POA: Diagnosis not present

## 2022-06-10 DIAGNOSIS — N400 Enlarged prostate without lower urinary tract symptoms: Secondary | ICD-10-CM | POA: Diagnosis not present

## 2022-06-10 DIAGNOSIS — S0101XA Laceration without foreign body of scalp, initial encounter: Secondary | ICD-10-CM | POA: Diagnosis present

## 2022-06-10 DIAGNOSIS — S32512A Fracture of superior rim of left pubis, initial encounter for closed fracture: Secondary | ICD-10-CM | POA: Diagnosis not present

## 2022-06-10 DIAGNOSIS — Z808 Family history of malignant neoplasm of other organs or systems: Secondary | ICD-10-CM

## 2022-06-10 DIAGNOSIS — Z79899 Other long term (current) drug therapy: Secondary | ICD-10-CM

## 2022-06-10 DIAGNOSIS — M47812 Spondylosis without myelopathy or radiculopathy, cervical region: Secondary | ICD-10-CM | POA: Diagnosis not present

## 2022-06-10 DIAGNOSIS — K219 Gastro-esophageal reflux disease without esophagitis: Secondary | ICD-10-CM | POA: Diagnosis present

## 2022-06-10 DIAGNOSIS — R278 Other lack of coordination: Secondary | ICD-10-CM | POA: Diagnosis not present

## 2022-06-10 DIAGNOSIS — Z66 Do not resuscitate: Secondary | ICD-10-CM | POA: Diagnosis present

## 2022-06-10 DIAGNOSIS — W1830XA Fall on same level, unspecified, initial encounter: Secondary | ICD-10-CM | POA: Diagnosis present

## 2022-06-10 DIAGNOSIS — S61501A Unspecified open wound of right wrist, initial encounter: Secondary | ICD-10-CM | POA: Diagnosis not present

## 2022-06-10 DIAGNOSIS — Z86010 Personal history of colonic polyps: Secondary | ICD-10-CM

## 2022-06-10 DIAGNOSIS — S51001A Unspecified open wound of right elbow, initial encounter: Secondary | ICD-10-CM | POA: Diagnosis not present

## 2022-06-10 DIAGNOSIS — Z8249 Family history of ischemic heart disease and other diseases of the circulatory system: Secondary | ICD-10-CM | POA: Diagnosis not present

## 2022-06-10 DIAGNOSIS — I4821 Permanent atrial fibrillation: Secondary | ICD-10-CM | POA: Diagnosis not present

## 2022-06-10 DIAGNOSIS — C9001 Multiple myeloma in remission: Secondary | ICD-10-CM | POA: Diagnosis not present

## 2022-06-10 DIAGNOSIS — R2689 Other abnormalities of gait and mobility: Secondary | ICD-10-CM | POA: Diagnosis not present

## 2022-06-10 DIAGNOSIS — I7121 Aneurysm of the ascending aorta, without rupture: Secondary | ICD-10-CM | POA: Diagnosis not present

## 2022-06-10 DIAGNOSIS — I129 Hypertensive chronic kidney disease with stage 1 through stage 4 chronic kidney disease, or unspecified chronic kidney disease: Secondary | ICD-10-CM | POA: Diagnosis not present

## 2022-06-10 DIAGNOSIS — M8440XS Pathological fracture, unspecified site, sequela: Secondary | ICD-10-CM | POA: Diagnosis not present

## 2022-06-10 DIAGNOSIS — E43 Unspecified severe protein-calorie malnutrition: Secondary | ICD-10-CM | POA: Diagnosis not present

## 2022-06-10 DIAGNOSIS — C9000 Multiple myeloma not having achieved remission: Secondary | ICD-10-CM | POA: Diagnosis not present

## 2022-06-10 DIAGNOSIS — S0100XA Unspecified open wound of scalp, initial encounter: Secondary | ICD-10-CM | POA: Diagnosis not present

## 2022-06-10 DIAGNOSIS — S0003XA Contusion of scalp, initial encounter: Secondary | ICD-10-CM | POA: Diagnosis not present

## 2022-06-10 DIAGNOSIS — Z83438 Family history of other disorder of lipoprotein metabolism and other lipidemia: Secondary | ICD-10-CM | POA: Diagnosis not present

## 2022-06-10 DIAGNOSIS — M6281 Muscle weakness (generalized): Secondary | ICD-10-CM | POA: Diagnosis not present

## 2022-06-10 DIAGNOSIS — I4891 Unspecified atrial fibrillation: Secondary | ICD-10-CM | POA: Diagnosis present

## 2022-06-10 DIAGNOSIS — S32592A Other specified fracture of left pubis, initial encounter for closed fracture: Secondary | ICD-10-CM | POA: Diagnosis not present

## 2022-06-10 DIAGNOSIS — Z7901 Long term (current) use of anticoagulants: Secondary | ICD-10-CM

## 2022-06-10 DIAGNOSIS — M8440XA Pathological fracture, unspecified site, initial encounter for fracture: Secondary | ICD-10-CM | POA: Diagnosis present

## 2022-06-10 DIAGNOSIS — M4312 Spondylolisthesis, cervical region: Secondary | ICD-10-CM | POA: Diagnosis present

## 2022-06-10 DIAGNOSIS — R55 Syncope and collapse: Secondary | ICD-10-CM | POA: Diagnosis not present

## 2022-06-10 DIAGNOSIS — R58 Hemorrhage, not elsewhere classified: Secondary | ICD-10-CM | POA: Diagnosis not present

## 2022-06-10 DIAGNOSIS — S32502A Unspecified fracture of left pubis, initial encounter for closed fracture: Secondary | ICD-10-CM

## 2022-06-10 DIAGNOSIS — K5900 Constipation, unspecified: Secondary | ICD-10-CM | POA: Diagnosis present

## 2022-06-10 DIAGNOSIS — E785 Hyperlipidemia, unspecified: Secondary | ICD-10-CM | POA: Diagnosis not present

## 2022-06-10 DIAGNOSIS — G893 Neoplasm related pain (acute) (chronic): Secondary | ICD-10-CM | POA: Diagnosis not present

## 2022-06-10 DIAGNOSIS — S81802A Unspecified open wound, left lower leg, initial encounter: Secondary | ICD-10-CM | POA: Diagnosis not present

## 2022-06-10 DIAGNOSIS — M25552 Pain in left hip: Secondary | ICD-10-CM | POA: Diagnosis not present

## 2022-06-10 DIAGNOSIS — S32592D Other specified fracture of left pubis, subsequent encounter for fracture with routine healing: Secondary | ICD-10-CM | POA: Diagnosis not present

## 2022-06-10 DIAGNOSIS — S32402A Unspecified fracture of left acetabulum, initial encounter for closed fracture: Secondary | ICD-10-CM | POA: Diagnosis not present

## 2022-06-10 DIAGNOSIS — R600 Localized edema: Secondary | ICD-10-CM | POA: Diagnosis not present

## 2022-06-10 DIAGNOSIS — Z888 Allergy status to other drugs, medicaments and biological substances status: Secondary | ICD-10-CM

## 2022-06-10 DIAGNOSIS — W010XXA Fall on same level from slipping, tripping and stumbling without subsequent striking against object, initial encounter: Secondary | ICD-10-CM | POA: Diagnosis not present

## 2022-06-10 DIAGNOSIS — R0902 Hypoxemia: Secondary | ICD-10-CM | POA: Diagnosis not present

## 2022-06-10 DIAGNOSIS — R531 Weakness: Secondary | ICD-10-CM | POA: Diagnosis not present

## 2022-06-10 DIAGNOSIS — W19XXXA Unspecified fall, initial encounter: Principal | ICD-10-CM

## 2022-06-10 DIAGNOSIS — N184 Chronic kidney disease, stage 4 (severe): Secondary | ICD-10-CM | POA: Diagnosis present

## 2022-06-10 DIAGNOSIS — S51002A Unspecified open wound of left elbow, initial encounter: Secondary | ICD-10-CM | POA: Diagnosis not present

## 2022-06-10 DIAGNOSIS — S329XXA Fracture of unspecified parts of lumbosacral spine and pelvis, initial encounter for closed fracture: Secondary | ICD-10-CM | POA: Diagnosis present

## 2022-06-10 DIAGNOSIS — N200 Calculus of kidney: Secondary | ICD-10-CM | POA: Diagnosis not present

## 2022-06-10 LAB — TROPONIN I (HIGH SENSITIVITY)
Troponin I (High Sensitivity): 13 ng/L (ref <18)
Troponin I (High Sensitivity): 14 ng/L (ref <18)

## 2022-06-10 LAB — COMPREHENSIVE METABOLIC PANEL
ALT: 16 U/L (ref 0–44)
AST: 29 U/L (ref 15–41)
Albumin: 3.4 g/dL — ABNORMAL LOW (ref 3.5–5.0)
Alkaline Phosphatase: 65 U/L (ref 38–126)
Anion gap: 10 (ref 5–15)
BUN: 27 mg/dL — ABNORMAL HIGH (ref 8–23)
CO2: 24 mmol/L (ref 22–32)
Calcium: 9.2 mg/dL (ref 8.9–10.3)
Chloride: 105 mmol/L (ref 98–111)
Creatinine, Ser: 1.06 mg/dL (ref 0.61–1.24)
GFR, Estimated: >60 mL/min (ref >60)
Glucose, Bld: 117 mg/dL — ABNORMAL HIGH (ref 70–99)
Potassium: 4 mmol/L (ref 3.5–5.1)
Sodium: 139 mmol/L (ref 135–145)
Total Bilirubin: 0.8 mg/dL (ref 0.3–1.2)
Total Protein: 5.7 g/dL — ABNORMAL LOW (ref 6.5–8.1)

## 2022-06-10 LAB — CBC WITH DIFFERENTIAL/PLATELET
Abs Immature Granulocytes: 0.12 10*3/uL — ABNORMAL HIGH (ref 0.00–0.07)
Basophils Absolute: 0 10*3/uL (ref 0.0–0.1)
Basophils Relative: 0 %
Eosinophils Absolute: 0 10*3/uL (ref 0.0–0.5)
Eosinophils Relative: 0 %
HCT: 37.2 % — ABNORMAL LOW (ref 39.0–52.0)
Hemoglobin: 11.8 g/dL — ABNORMAL LOW (ref 13.0–17.0)
Immature Granulocytes: 1 %
Lymphocytes Relative: 9 %
Lymphs Abs: 1 10*3/uL (ref 0.7–4.0)
MCH: 31.9 pg (ref 26.0–34.0)
MCHC: 31.7 g/dL (ref 30.0–36.0)
MCV: 100.5 fL — ABNORMAL HIGH (ref 80.0–100.0)
Monocytes Absolute: 0.8 10*3/uL (ref 0.1–1.0)
Monocytes Relative: 8 %
Neutro Abs: 8.6 10*3/uL — ABNORMAL HIGH (ref 1.7–7.7)
Neutrophils Relative %: 82 %
Platelets: 81 10*3/uL — ABNORMAL LOW (ref 150–400)
RBC: 3.7 MIL/uL — ABNORMAL LOW (ref 4.22–5.81)
RDW: 16 % — ABNORMAL HIGH (ref 11.5–15.5)
WBC: 10.6 10*3/uL — ABNORMAL HIGH (ref 4.0–10.5)
nRBC: 0 % (ref 0.0–0.2)

## 2022-06-10 LAB — URINALYSIS, ROUTINE W REFLEX MICROSCOPIC
Bilirubin Urine: NEGATIVE
Glucose, UA: 150 mg/dL — AB
Hgb urine dipstick: NEGATIVE
Ketones, ur: NEGATIVE mg/dL
Leukocytes,Ua: NEGATIVE
Nitrite: NEGATIVE
Protein, ur: 30 mg/dL — AB
Specific Gravity, Urine: 1.018 (ref 1.005–1.030)
Squamous Epithelial / HPF: NONE SEEN (ref 0–5)
pH: 5 (ref 5.0–8.0)

## 2022-06-10 LAB — D-DIMER, QUANTITATIVE: D-Dimer, Quant: 3.68 ug/mL-FEU — ABNORMAL HIGH (ref 0.00–0.50)

## 2022-06-10 LAB — BRAIN NATRIURETIC PEPTIDE: B Natriuretic Peptide: 160.7 pg/mL — ABNORMAL HIGH (ref 0.0–100.0)

## 2022-06-10 MED ORDER — LIDOCAINE-EPINEPHRINE 2 %-1:100000 IJ SOLN
20.0000 mL | Freq: Once | INTRAMUSCULAR | Status: AC
  Administered 2022-06-10: 20 mL via INTRADERMAL
  Filled 2022-06-10: qty 1

## 2022-06-10 MED ORDER — APIXABAN 5 MG PO TABS
5.0000 mg | ORAL_TABLET | Freq: Two times a day (BID) | ORAL | Status: DC
  Administered 2022-06-11 – 2022-06-15 (×9): 5 mg via ORAL
  Filled 2022-06-10 (×9): qty 1

## 2022-06-10 MED ORDER — IBUPROFEN 400 MG PO TABS
400.0000 mg | ORAL_TABLET | Freq: Once | ORAL | Status: AC
  Administered 2022-06-10: 400 mg via ORAL
  Filled 2022-06-10: qty 1

## 2022-06-10 MED ORDER — POLYETHYLENE GLYCOL 3350 17 G PO PACK
17.0000 g | PACK | Freq: Every day | ORAL | Status: DC
  Administered 2022-06-11: 17 g via ORAL
  Filled 2022-06-10: qty 1

## 2022-06-10 MED ORDER — SODIUM CHLORIDE 0.9 % IV SOLN: INTRAVENOUS | Status: AC

## 2022-06-10 MED ORDER — ACETAMINOPHEN 500 MG PO TABS
500.0000 mg | ORAL_TABLET | Freq: Three times a day (TID) | ORAL | Status: DC | PRN
  Administered 2022-06-10 – 2022-06-14 (×2): 1000 mg via ORAL
  Filled 2022-06-10 (×3): qty 2

## 2022-06-10 MED ORDER — MORPHINE SULFATE ER 15 MG PO TBCR
30.0000 mg | EXTENDED_RELEASE_TABLET | Freq: Two times a day (BID) | ORAL | Status: DC
  Administered 2022-06-10 – 2022-06-15 (×10): 30 mg via ORAL
  Filled 2022-06-10 (×10): qty 2

## 2022-06-10 MED ORDER — LENALIDOMIDE 20 MG PO CAPS: 20.0000 mg | ORAL_CAPSULE | Freq: Every day | ORAL | Status: DC

## 2022-06-10 MED ORDER — ENSURE ENLIVE PO LIQD
237.0000 mL | Freq: Three times a day (TID) | ORAL | Status: DC
  Administered 2022-06-10 – 2022-06-13 (×5): 237 mL via ORAL

## 2022-06-10 MED ORDER — SODIUM CHLORIDE 0.9% FLUSH
3.0000 mL | Freq: Two times a day (BID) | INTRAVENOUS | Status: DC
  Administered 2022-06-10 – 2022-06-15 (×9): 3 mL via INTRAVENOUS

## 2022-06-10 MED ORDER — MORPHINE SULFATE 15 MG PO TABS
15.0000 mg | ORAL_TABLET | Freq: Four times a day (QID) | ORAL | Status: DC | PRN
  Administered 2022-06-10 – 2022-06-14 (×8): 15 mg via ORAL
  Filled 2022-06-10 (×8): qty 1

## 2022-06-10 MED ORDER — ACETAMINOPHEN 500 MG PO TABS
1000.0000 mg | ORAL_TABLET | Freq: Once | ORAL | Status: AC
  Administered 2022-06-10: 1000 mg via ORAL
  Filled 2022-06-10: qty 2

## 2022-06-10 MED ORDER — GABAPENTIN 300 MG PO CAPS
600.0000 mg | ORAL_CAPSULE | Freq: Two times a day (BID) | ORAL | Status: DC
  Administered 2022-06-10 – 2022-06-15 (×10): 600 mg via ORAL
  Filled 2022-06-10 (×10): qty 2

## 2022-06-10 MED ORDER — MORPHINE SULFATE (PF) 2 MG/ML IV SOLN: 2.0000 mg | INTRAVENOUS | Status: DC | PRN

## 2022-06-10 MED ORDER — ONDANSETRON HCL 4 MG/2ML IJ SOLN: 4.0000 mg | Freq: Four times a day (QID) | INTRAMUSCULAR | Status: DC | PRN

## 2022-06-10 MED ORDER — MORPHINE SULFATE ER 15 MG PO TBCR
30.0000 mg | EXTENDED_RELEASE_TABLET | Freq: Once | ORAL | Status: AC
  Administered 2022-06-10: 30 mg via ORAL
  Filled 2022-06-10: qty 2

## 2022-06-10 MED ORDER — OXYCODONE HCL 5 MG PO TABS: 5.0000 mg | ORAL_TABLET | Freq: Four times a day (QID) | ORAL | Status: DC | PRN

## 2022-06-10 MED ORDER — ONDANSETRON HCL 4 MG PO TABS: 4.0000 mg | ORAL_TABLET | Freq: Four times a day (QID) | ORAL | Status: DC | PRN

## 2022-06-10 NOTE — Consult Note (Signed)
ORTHOPAEDIC CONSULTATION  REQUESTING PHYSICIAN: Clance Boll, MD  Chief Complaint: Left hip pain  HPI: Jack [name] is a 88 y.o. male who complains of left hip pain after mechanical fall earlier today. The pain is sharp in character. The pain is worse with movement and better with rest. Denies any numbness, tingling or constitutional symptoms.  Patient typically utilizes a walker when outside the home.  CT scan showed presence of left superior pubic ramus extending into the acetabulum, nondisplaced as well as inferior pubic ramus fracture.  Orthopedics was consulted regarding further management.   Past Medical History:  Diagnosis Date   Ascending aortic aneurysm ([place])    a. 12/2020 Echo: Asc Ao 10m, Ao root 455m   CKD (chronic kidney disease), stage III (HCC)    Diastolic dysfunction    a. 12/2020 Echo: EF 50-55%, no rwma, mild LVH, GrI DD, nl RV fxn, mild AI. Asc Ao 481mAo root 98m42m Elevated prostate specific antigen (PSA)    has been 7 for a year    GERD (gastroesophageal reflux disease)    History of colon polyps 2008   [facility] History of kidney stones    Hyperlipidemia    Hypertension    Myeloma (HCC)[place] PAF (paroxysmal atrial fibrillation) (HCC)[place] a. 01/2021-->Eliquis (CHA2DS2VASc = 3-4).   Pain    Prostate hypertrophy    diagnosed at age 73 d34 to hematospermia   Past Surgical History:  Procedure Laterality Date   CATARACT EXTRACTION W/PHACO Left 01/10/2018   Procedure: CATARACT EXTRACTION PHACO AND INTRAOCULAR LENS PLACEMENT (IOC)[place]geon[place] PorfBirder Robson;  Location: [facility] OR[facility];  Service: Ophthalmology;  Laterality: Left;  US 0[name]24.8 AP% 14.9 CDE 3.68 Fluid pack lot # [id number] CATARACT EXTRACTION W/PHACO Right 01/25/2018   Procedure: CATARACT EXTRACTION PHACO AND INTRAOCULAR LENS PLACEMENT (IOC);  Surgeon: PorfBirder Robson;  Location: [facility] ORS;  Service: Ophthalmology;  Laterality: Right;  US 0[name]42 AP% 10.8 CDE 4.59 Fluid  pack lot # [id number] COLON SURGERY     CYSTOSCOPY W/ URETERAL STENT PLACEMENT Right 10/16/2017   Procedure: right  URETERAL STENT PLACEMENT,cystoscopy bilateral stent removal,rretrograde;  Surgeon: StoiAbbie Sons;  Location: [facility] ORS;  Service: Urology;  Laterality: Right;   CYSTOSCOPY/URETEROSCOPY/HOLMIUM LASER/STENT PLACEMENT Right 12/16/2020   Procedure: CYSTOSCOPY/URETEROSCOPY/HOLMIUM LASER/STENT PLACEMENT;  Surgeon: StoiAbbie Sons;  Location: [facility] ORS;  Service: Urology;  Laterality: Right;   EXTRACORPOREAL SHOCK WAVE LITHOTRIPSY Right 12/11/2020   Procedure: EXTRACORPOREAL SHOCK WAVE LITHOTRIPSY (ESWL);  Surgeon: StoiAbbie Sons;  Location: [facility] ORS;  Service: Urology;  Laterality: Right;   IR BONE TUMOR(S)RF ABLATION  04/16/2022   IR KYPHO EA ADDL LEVEL THORACIC OR LUMBAR  02/02/2021   IR KYPHO LUMBAR INC FX REDUCE BONE BX UNI/BIL CANNULATION INC/IMAGING  02/02/2021   IR KYPHO THORACIC WITH BONE BIOPSY  04/15/2022   KIDNEY STONE SURGERY     KYPHOPLASTY N/A 03/12/2021   Procedure: KYPHHewitt Shorts;  Surgeon: MenzHessie Knows;  Location: [facility] ORS;  Service: Orthopedics;  Laterality: N/A;   RESECTION SOFT TISSUE TUMOR LEG / ANKLE RADICAL  jan 2009   [facility],  right thigh/knee , nonmalignant   SMALL INTESTINE SURGERY  1946   implaed on picket fence, punctured stomach   TONSILLECTOMY     Social History   Socioeconomic History   Marital status: Married    Spouse name: BaraMarland Mcalpineumber of children: 4  Years of education: 51   Highest education level: Not on file  Occupational History   Occupation: Retired    Fish farm manager: retired    Comment: Real Estate   Tobacco Use   Smoking status: Former    Types: Cigarettes    Quit date: 07/05/1965    Years since quitting: 56.9   Smokeless tobacco: Never  Vaping Use   Vaping Use: Never used  Substance and Sexual Activity   Alcohol use: Yes    Alcohol/week: 1.0 standard drink of alcohol    Types: 1 Standard drinks or equivalent  per week    Comment: occassionaly   Drug use: No   Sexual activity: Yes    Birth control/protection: None  Other Topics Concern   Not on file  Social History Narrative   Not on file   Social Determinants of Health   Financial Resource Strain: Not on file  Food Insecurity: Not on file  Transportation Needs: Not on file  Physical Activity: Not on file  Stress: Not on file  Social Connections: Not on file   Family History  Problem Relation Age of Onset   Hypertension Father    Hyperlipidemia Father    Cancer Sister        thyroid - dx in late 20's   Allergies  Allergen Reactions   Quinolones     Aortic dilation contraindicates FQ   Amlodipine Swelling and Other (See Comments)    LE edema   Azithromycin Nausea And Vomiting   Lipitor [Atorvastatin]     Muscle pain in RT Leg    Lisinopril Cough   Zetia [Ezetimibe]     Pain in legs    Prior to Admission medications   Medication Sig Start Date End Date Taking? Authorizing Provider  acetaminophen (TYLENOL) 500 MG tablet Take 500-1,000 mg by mouth every 8 (eight) hours as needed for moderate pain.    [provider]  Ascorbic Acid (VITAMIN C) 1000 MG tablet Take 1,000 mg by mouth in the morning and at bedtime.    [provider]  Calcium Carb-Cholecalciferol (OYSTER SHELL CALCIUM W/D) 500-5 MG-MCG TABS Take 1 tablet by mouth in the morning and at bedtime.    [provider]  carvedilol (COREG) 25 MG tablet Take 1 tablet (25 mg total) by mouth 2 (two) times daily with a meal. 08/31/21   Finnegan, Kathlene November, MD  dexamethasone (DECADRON) 4 MG tablet TAKE 5 TABLETS BY MOUTH ONE TIME PER WEEK 06/08/22   Lloyd Huger, MD  ELIQUIS 5 MG TABS tablet TAKE 1 TABLET BY MOUTH TWICE A DAY 05/19/22   Lloyd Huger, MD  feeding supplement (ENSURE ENLIVE / ENSURE PLUS) LIQD Take 237 mLs by mouth 3 (three) times daily between meals. Patient taking differently: Take 237 mLs by mouth daily with lunch. 04/07/21    Enzo Bi, MD  gabapentin (NEURONTIN) 300 MG capsule TAKE 2 CAPSULES BY MOUTH 3 TIMES DAILY. Patient taking differently: Take 600 mg by mouth 2 (two) times daily. 12/22/21   Lloyd Huger, MD  lenalidomide (REVLIMID) 20 MG capsule Take 1 capsule (20 mg total) by mouth daily. Take for 21 days, then hold for 7 days. Repeat every 28 days 05/27/22   Lloyd Huger, MD  lidocaine (XYLOCAINE) 5 % ointment Apply topically 3 (three) times daily as needed for mild pain or moderate pain. 05/06/22   Darl Pikes, RPH-CPP  morphine (MS CONTIN) 30 MG 12 hr tablet Take 1 tablet (30 mg  total) by mouth every 12 (twelve) hours. 05/31/22   Borders, Kirt Boys, NP  morphine (MSIR) 15 MG tablet Take 1 tablet (15 mg total) by mouth every 6 (six) hours as needed for severe pain. 06/07/22   Borders, Kirt Boys, NP  Multiple Vitamin (MULTIVITAMIN WITH MINERALS) TABS tablet Take 1 tablet by mouth daily. 04/08/21   Enzo Bi, MD  Multiple Vitamins-Minerals (PRESERVISION AREDS PO) Take 1 capsule by mouth in the morning and at bedtime.    [provider]  naloxone (NARCAN) nasal spray 4 mg/0.1 mL SPRAY 1 SPRAY INTO ONE NOSTRIL AS DIRECTED FOR OPIOID OVERDOSE (TURN PERSON ON SIDE AFTER DOSE. IF NO RESPONSE IN 2-3 MINUTES OR PERSON RESPONDS BUT RELAPSES, REPEAT USING A NEW SPRAY DEVICE AND SPRAY INTO THE OTHER NOSTRIL. CALL 911 AFTER USE.) * EMERGENCY USE ONLY * 03/25/21   Borders, Kirt Boys, NP  ondansetron (ZOFRAN) 8 MG tablet Take 1 tablet (8 mg total) by mouth every 8 (eight) hours as needed for nausea or vomiting. Patient not taking: Reported on 04/27/2022 03/25/21   Borders, Kirt Boys, NP  polyethylene glycol (MIRALAX / GLYCOLAX) 17 g packet Take 17 g by mouth 2 (two) times daily. Patient taking differently: Take 17 g by mouth daily. 04/07/21   Enzo Bi, MD  potassium citrate (UROCIT-K) 10 MEQ (1080 MG) SR tablet TAKE 1 TABLET (10 MEQ TOTAL) BY MOUTH 3 (THREE) TIMES DAILY WITH MEALS. 11/18/21   Stoioff, Ronda Fairly, MD   senna-docusate (SENOKOT-S) 8.6-50 MG tablet Take 1 tablet by mouth 2 (two) times daily. 03/17/21   Nicole Kindred A, DO  tamsulosin (FLOMAX) 0.4 MG CAPS capsule TAKE 1 CAPSULE BY MOUTH EVERY DAY 04/13/21   Abbie Sons, MD   DG Chest 1 View  Result Date: 06/10/2022 CLINICAL DATA:  Syncope. EXAM: CHEST  1 VIEW COMPARISON:  03/26/2021 and other prior studies. FINDINGS: The heart size and mediastinal contours are within normal limits. Stable mild elevation of the right hemidiaphragm. There is no evidence of pulmonary edema, consolidation, pneumothorax or pleural fluid. The visualized skeletal structures are unremarkable. IMPRESSION: No active disease. Electronically Signed   By: Aletta Edouard M.D.   On: 06/10/2022 16:40   CT PELVIS WO CONTRAST  Result Date: 06/10/2022 CLINICAL DATA:  Hip trauma.  Evaluate for fracture. EXAM: CT PELVIS WITHOUT CONTRAST TECHNIQUE: Multidetector CT imaging of the pelvis was performed following the standard protocol without intravenous contrast. RADIATION DOSE REDUCTION: This exam was performed according to the departmental dose-optimization program which includes automated exposure control, adjustment of the mA and/or kV according to patient size and/or use of iterative reconstruction technique. COMPARISON:  Left hip x-ray 06/10/2022. lumbar spine MRI 05/25/2022. FINDINGS: Urinary Tract: There are right renal calculi measuring up to 4 mm. There is a single punctate calculus in the bladder. The bladder is decompressed. Bowel:  Unremarkable visualized pelvic bowel loops. Vascular/Lymphatic: No enlarged lymph nodes are seen. There are atherosclerotic calcifications of the aorta and iliac arteries. Reproductive: Prostate gland is significantly enlarged measuring 6.5 cm in transverse dimension. Calcifications are present. Other:  No ascites.  Small fat containing umbilical hernia. Musculoskeletal: The bones are diffusely osteopenic. There is an acute comminuted nondisplaced  fracture of the left inferior pubic ramus. There is an acute nondisplaced fracture of the lateral aspect of the left superior pubic ramus with extension into the anterior left acetabulum. There is no evidence for dislocation. There is a subtle acute fracture of the anterior cortex at the S2 level with some  presacral edema and hemorrhage. This is best seen on coronal image 7/136. Vertebroplasty changes are seen at L3. There is minimal chronic compression deformity superior endplate of L4. No retropulsion of fracture fragments. Pubic symphysis and sacroiliac joints appear intact. Mild degenerative changes affect both hips. There is some soft tissue stranding and fluid surrounding the fractures. There is also some stranding and hemorrhage along the left pelvic sidewall. No discrete focal hematoma identified. IMPRESSION: 1. Acute nondisplaced fracture of the left superior pubic ramus with extension into the anterior left acetabulum. 2. Acute nondisplaced fracture of the left inferior pubic ramus. 3. Acute nondisplaced fracture anterior cortex of S2. 4. Stranding and hemorrhage along the left pelvic sidewall and presacral space. No discrete hematoma. 5. Nonobstructing right renal calculi. 6. Punctate bladder calculus. 7. Prostatomegaly. Electronically Signed   By: Ronney Asters M.D.   On: 06/10/2022 15:33   CT Head Wo Contrast  Result Date: 06/10/2022 CLINICAL DATA:  Provided history: Abnormal olfaction (CN 1). Neck trauma, abnormal mental status or neuro exam. Additional history provided: Fall. Laceration on top of head. Patient on Eliquis. None. EXAM: CT HEAD WITHOUT CONTRAST CT CERVICAL SPINE WITHOUT CONTRAST TECHNIQUE: Multidetector CT imaging of the head and cervical spine was performed following the standard protocol without intravenous contrast. Multiplanar CT image reconstructions of the cervical spine were also generated. RADIATION DOSE REDUCTION: This exam was performed according to the departmental  dose-optimization program which includes automated exposure control, adjustment of the mA and/or kV according to patient size and/or use of iterative reconstruction technique. COMPARISON:  None Available. FINDINGS: CT HEAD FINDINGS Brain: Mild generalized parenchymal atrophy. There is no acute intracranial hemorrhage. No demarcated cortical infarct. No extra-axial fluid collection. No evidence of an intracranial mass. No midline shift. Vascular: No hyperdense vessel.  Atherosclerotic calcifications. Skull: No fracture or aggressive osseous lesion. Sinuses/Orbits: Mucosal thickening, and small mucous retention cysts, within the right maxillary sinus. Minimal mucosal thickening within the left maxillary sinus. Minimal mucosal thickening within the bilateral ethmoid and frontal sinuses. Other: Left parietal scalp hematoma. CT CERVICAL SPINE FINDINGS Alignment: Slight grade 1 anterolisthesis at C4-C5. 3 mm C5-C6 grade 1 anterolisthesis. Slight grade 1 anterolisthesis at C6-C7. Skull base and vertebrae: The basion-dental and atlanto-dental intervals are maintained.No evidence of acute fracture to the cervical spine. Soft tissues and spinal canal: No prevertebral fluid or swelling. No visible canal hematoma. Disc levels: Cervical spondylosis with multilevel disc space narrowing, disc bulges/central disc protrusions, uncovertebral hypertrophy and facet arthrosis. No appreciable high-grade spinal canal stenosis. Multilevel bony neural foraminal narrowing. Degenerative changes are also present about the C1-C2 articulation. Upper chest: No consolidation within the imaged lung apices. No visible pneumothorax. IMPRESSION: CT head: 1. No evidence of acute intracranial abnormality. 2. Left parietal scalp hematoma. 3. Mild generalized parenchymal atrophy. 4. Mild paranasal sinus disease, as described. CT cervical spine: 1. No evidence of acute fracture to the cervical spine. 2. Grade 1 anterolisthesis at C4-C5, C5-C6 and C6-C7. 3.  Cervical spondylosis, as described. Electronically Signed   By: Kellie Simmering D.O.   On: 06/10/2022 10:10   CT Cervical Spine Wo Contrast  Result Date: 06/10/2022 CLINICAL DATA:  Provided history: Abnormal olfaction (CN 1). Neck trauma, abnormal mental status or neuro exam. Additional history provided: Fall. Laceration on top of head. Patient on Eliquis. None. EXAM: CT HEAD WITHOUT CONTRAST CT CERVICAL SPINE WITHOUT CONTRAST TECHNIQUE: Multidetector CT imaging of the head and cervical spine was performed following the standard protocol without intravenous contrast. Multiplanar CT image reconstructions  of the cervical spine were also generated. RADIATION DOSE REDUCTION: This exam was performed according to the departmental dose-optimization program which includes automated exposure control, adjustment of the mA and/or kV according to patient size and/or use of iterative reconstruction technique. COMPARISON:  None Available. FINDINGS: CT HEAD FINDINGS Brain: Mild generalized parenchymal atrophy. There is no acute intracranial hemorrhage. No demarcated cortical infarct. No extra-axial fluid collection. No evidence of an intracranial mass. No midline shift. Vascular: No hyperdense vessel.  Atherosclerotic calcifications. Skull: No fracture or aggressive osseous lesion. Sinuses/Orbits: Mucosal thickening, and small mucous retention cysts, within the right maxillary sinus. Minimal mucosal thickening within the left maxillary sinus. Minimal mucosal thickening within the bilateral ethmoid and frontal sinuses. Other: Left parietal scalp hematoma. CT CERVICAL SPINE FINDINGS Alignment: Slight grade 1 anterolisthesis at C4-C5. 3 mm C5-C6 grade 1 anterolisthesis. Slight grade 1 anterolisthesis at C6-C7. Skull base and vertebrae: The basion-dental and atlanto-dental intervals are maintained.No evidence of acute fracture to the cervical spine. Soft tissues and spinal canal: No prevertebral fluid or swelling. No visible canal  hematoma. Disc levels: Cervical spondylosis with multilevel disc space narrowing, disc bulges/central disc protrusions, uncovertebral hypertrophy and facet arthrosis. No appreciable high-grade spinal canal stenosis. Multilevel bony neural foraminal narrowing. Degenerative changes are also present about the C1-C2 articulation. Upper chest: No consolidation within the imaged lung apices. No visible pneumothorax. IMPRESSION: CT head: 1. No evidence of acute intracranial abnormality. 2. Left parietal scalp hematoma. 3. Mild generalized parenchymal atrophy. 4. Mild paranasal sinus disease, as described. CT cervical spine: 1. No evidence of acute fracture to the cervical spine. 2. Grade 1 anterolisthesis at C4-C5, C5-C6 and C6-C7. 3. Cervical spondylosis, as described. Electronically Signed   By: Kellie Simmering D.O.   On: 06/10/2022 10:10   DG Hip Unilat W or Wo Pelvis 2-3 Views Left  Result Date: 06/10/2022 CLINICAL DATA:  Fall, left hip pain EXAM: DG HIP (WITH OR WITHOUT PELVIS) 2-3V LEFT COMPARISON:  None Available. FINDINGS: Vertebroplasty material overlies chronic L4 compression fracture. Hernia repair mesh overlies the left pelvis. Acute nondisplaced fracture of the lateral aspect of the left superior pubic ramus, potentially extending into the medial wall of the left acetabulum. Acute nondisplaced fracture of the medial aspect of the inferior left pubic ramus extending into the left pubic symphysis. No hip dislocation. Mild bilateral hip osteoarthritis. No suspicious focal osseous lesions. IMPRESSION: Acute nondisplaced fracture of the lateral aspect of the left superior pubic ramus, potentially extending into the medial wall of the left acetabulum. Acute nondisplaced fracture of the medial aspect of the inferior left pubic ramus extending into the left pubic symphysis. Electronically Signed   By: Ilona Sorrel M.D.   On: 06/10/2022 10:04    Positive ROS: All other systems have been reviewed and were otherwise  negative with the exception of those mentioned in the HPI and as above.  Physical Exam: General: Alert, no acute distress Cardiovascular: No pedal edema Respiratory: No cyanosis, no use of accessory musculature GI: No organomegaly, abdomen is soft and non-tender Skin: No lesions in the area of chief complaint Neurologic: Sensation intact distally Psychiatric: Patient is competent for consent with normal mood and affect Lymphatic: No axillary or cervical lymphadenopathy  MUSCULOSKELETAL: Tenderness palpation about the left hip, range of motion deferred. Compartments soft. Good cap refill. Motor and sensory intact distally.  Assessment: 88 year old male with left hip pain after mechanical fall, found to have nondisplaced left superior pubic ramus fracture extending into the acetabulum, nondisplaced inferior pubic ramus  fracture  Plan: I discussed the CT findings with the patient as well as treatment options.  He has been admitted to the hospitalist service for pain control and PT.  Recommend pain control and partial protected weightbearing (25-50%).  Follow-up in clinic/outpatient setting for repeat x-rays of left hip/pelvis in approximately 2 weeks.   Renee Harder, MD    06/10/2022 6:12 PM

## 2022-06-10 NOTE — H&P (Signed)
History and Physical    CLARKE PERETZ JGG:[id number] DOB: 1932/10/09 DOA: 06/10/2022  PCP: [name], MD  Patient coming from: home  I have personally briefly reviewed patient's old medical records in [place]  Chief Complaint: fall   HPI: Jack [name] is a 88 y.o. male with medical history significant of  hypertension, CKD stage IIIb, A. fib on Eliquis, GERD,BPH, HLD, Multiple myeloma  , chronic compression fracture with chronic back pain due to multiple myeloma. Patient presents to ED BIBEMS s/p fall while walking to bathroom s/p which he noted left hip pain as well as injury to his head.  Patient in field per EMS was noted to be orthostatic.  Patient states he has had these spells in the past few months of feeling lightheaded but has not had a fall.  He denies any chest pain, sob/ n/v/d/ fever/chills/ dysuria/ blood in stools or black stools. He states he was in his normal state of health prior to this episode.  ED Course:  Afeb, bp 159/83, HR 64, rr18 sat 96% on ra  CTH 1. No evidence of acute intracranial abnormality. 2. Left parietal scalp hematoma. 3. Mild generalized parenchymal atrophy. 4. Mild paranasal sinus disease, as described CT cervical spine   1. No evidence of acute fracture to the cervical spine. 2. Grade 1 anterolisthesis at C4-C5, C5-C6 and C6-C7. 3. Cervical spondylosis, as described.   Xray hip  IMPRESSION: Acute nondisplaced fracture of the lateral aspect of the left superior pubic ramus, potentially extending into the medial wall of the left acetabulum.   Acute nondisplaced fracture of the medial aspect of the inferior left pubic ramus extending into the left pubic symphysis.    UA: essentially neg  NA 139, K 4, CL 105, glu 117, cr 1.06,  BNP 160.7 CE 13 wbcL 10.6, hgb 11.8,plt 81, mcv 100  Tx ns msir 103m  Cxr:NAD  Review of Systems: As per HPI otherwise 10 point review of systems negative.   Past Medical History:   Diagnosis Date   Ascending aortic aneurysm ([place]    a. 12/2020 Echo: Asc Ao 462m Ao root 4013m  CKD (chronic kidney disease), stage III (HCC)    Diastolic dysfunction    a. 12/2020 Echo: EF 50-55%, no rwma, mild LVH, GrI DD, nl RV fxn, mild AI. Asc Ao 78m81mo root 40mm29mElevated prostate specific antigen (PSA)    has been 7 for a year    GERD (gastroesophageal reflux disease)    [facility] polyps [facility]    Hyperlipidemia    Hypertension    [place] 01/20[place]-->Eliquis (CHA2DS2VASc = 3-4).   Pain    Prostate hypertrophy    diagnosed at age 72 du57to hematospermia    Past Surgical History:  Procedure Laterality Date   CATARACT EXTRACTION W/PHACO Left 01/10/2018   Procedure: CATARACT EXTRACTION PHACO AND INTRAOCULAR LENS PLACEMENT (IOC);[place] Porfi[place]der Robson  Location: [facility];  Service: Ophthalmology;  Laterality: Left;  US 00[name]4.8 AP% 14.9 CDE 3.68 Fluid pack lot # [id number] EXTRACTION W/PHACO Right 01/25/2018   Procedure: CATARACT EXTRACTION PHACO AND INTRAOCULAR LENS PLACEMENT (IOC);  Surgeon: PorfiBirder Robson  Location: [facility] ORS;  Service: Ophthalmology;  Laterality: Right;  US 00[name]2 AP% 10.8 CDE 4.59 Fluid pack lot # [id number] SURGERY  CYSTOSCOPY W/ URETERAL STENT PLACEMENT Right 10/16/2017   Procedure: right  URETERAL STENT PLACEMENT,cystoscopy bilateral stent removal,rretrograde;  Surgeon: Abbie Sons, MD;  Location: [facility] ORS;  Service: Urology;  Laterality: Right;   CYSTOSCOPY/URETEROSCOPY/HOLMIUM LASER/STENT PLACEMENT Right 12/16/2020   Procedure: CYSTOSCOPY/URETEROSCOPY/HOLMIUM LASER/STENT PLACEMENT;  Surgeon: Abbie Sons, MD;  Location: [facility] ORS;  Service: Urology;  Laterality: Right;   EXTRACORPOREAL SHOCK WAVE LITHOTRIPSY Right 12/11/2020   Procedure: EXTRACORPOREAL SHOCK WAVE LITHOTRIPSY (ESWL);  Surgeon: Abbie Sons, MD;  Location: [facility] OR[facility];  Service: Urology;  Laterality: Right;   IR BONE TUMOR(S)RF ABLATION  04/16/2022   IR KYPHO EA ADDL LEVEL THORACIC OR LUMBAR  02/02/2021   IR KYPHO LUMBAR INC FX REDUCE BONE BX UNI/BIL CANNULATION INC/IMAGING  02/02/2021   IR KYPHO THORACIC WITH BONE BIOPSY  04/15/2022   KIDNEY STONE SURGERY     KYPHOPLASTY N/A 03/12/2021   Procedure: Hewitt Shorts, L3;  Surgeon: Hessie Knows, MD;  Location: [facility] ORS;  Service: Orthopedics;  Laterality: N/A;   RESECTION SOFT TISSUE TUMOR LEG / ANKLE RADICAL  jan 2009   [facility],  right thigh/knee , nonmalignant   SMALL INTESTINE SURGERY  1946   implaed on picket fence, punctured stomach   TONSILLECTOMY       reports that he quit smoking about 56 years ago. His smoking use included cigarettes. He has never used smokeless tobacco. He reports current alcohol use of about 1.0 standard drink of alcohol per week. He reports that he does not use drugs.  Allergies  Allergen Reactions   Quinolones     Aortic dilation contraindicates FQ   Amlodipine Swelling and Other (See Comments)    LE edema   Azithromycin Nausea And Vomiting   Lipitor [Atorvastatin]     Muscle pain in RT Leg    Lisinopril Cough   Zetia [Ezetimibe]     Pain in legs     Family History  Problem Relation Age of Onset   Hypertension Father    Hyperlipidemia Father    Cancer Sister        thyroid - dx in late 20's    Prior to Admission medications   Medication Sig Start Date End Date Taking? Authorizing Provider  acetaminophen (TYLENOL) 500 MG tablet Take 500-1,000 mg by mouth every 8 (eight) hours as needed for moderate pain.    [provider]  Ascorbic Acid (VITAMIN C) 1000 MG tablet Take 1,000 mg by mouth in the morning and at bedtime.    [provider]  Calcium Carb-Cholecalciferol (OYSTER SHELL CALCIUM W/D) 500-5 MG-MCG TABS Take 1 tablet by mouth in the morning and at bedtime.    [provider]  carvedilol (COREG) 25 MG  tablet Take 1 tablet (25 mg total) by mouth 2 (two) times daily with a meal. 08/31/21   Finnegan, Kathlene November, MD  dexamethasone (DECADRON) 4 MG tablet TAKE 5 TABLETS BY MOUTH ONE TIME PER WEEK 06/08/22   Lloyd Huger, MD  ELIQUIS 5 MG TABS tablet TAKE 1 TABLET BY MOUTH TWICE A DAY 05/19/22   Lloyd Huger, MD  feeding supplement (ENSURE ENLIVE / ENSURE PLUS) LIQD Take 237 mLs by mouth 3 (three) times daily between meals. Patient taking differently: Take 237 mLs by mouth daily with lunch. 04/07/21   Enzo Bi, MD  gabapentin (NEURONTIN) 300 MG capsule TAKE 2 CAPSULES BY MOUTH 3 TIMES DAILY. Patient taking differently: Take 600 mg by mouth 2 (two) times daily. 12/22/21   Delight Hoh  J, MD  lenalidomide (REVLIMID) 20 MG capsule Take 1 capsule (20 mg total) by mouth daily. Take for 21 days, then hold for 7 days. Repeat every 28 days 05/27/22   Lloyd Huger, MD  lidocaine (XYLOCAINE) 5 % ointment Apply topically 3 (three) times daily as needed for mild pain or moderate pain. 05/06/22   Darl Pikes, RPH-CPP  morphine (MS CONTIN) 30 MG 12 hr tablet Take 1 tablet (30 mg total) by mouth every 12 (twelve) hours. 05/31/22   Borders, Kirt Boys, NP  morphine (MSIR) 15 MG tablet Take 1 tablet (15 mg total) by mouth every 6 (six) hours as needed for severe pain. 06/07/22   Borders, Kirt Boys, NP  Multiple Vitamin (MULTIVITAMIN WITH MINERALS) TABS tablet Take 1 tablet by mouth daily. 04/08/21   Enzo Bi, MD  Multiple Vitamins-Minerals (PRESERVISION AREDS PO) Take 1 capsule by mouth in the morning and at bedtime.    [provider]  naloxone (NARCAN) nasal spray 4 mg/0.1 mL SPRAY 1 SPRAY INTO ONE NOSTRIL AS DIRECTED FOR OPIOID OVERDOSE (TURN PERSON ON SIDE AFTER DOSE. IF NO RESPONSE IN 2-3 MINUTES OR PERSON RESPONDS BUT RELAPSES, REPEAT USING A NEW SPRAY DEVICE AND SPRAY INTO THE OTHER NOSTRIL. CALL 911 AFTER USE.) * EMERGENCY USE ONLY * 03/25/21   Borders, Kirt Boys, NP  ondansetron (ZOFRAN)  8 MG tablet Take 1 tablet (8 mg total) by mouth every 8 (eight) hours as needed for nausea or vomiting. Patient not taking: Reported on 04/27/2022 03/25/21   Borders, Kirt Boys, NP  polyethylene glycol (MIRALAX / GLYCOLAX) 17 g packet Take 17 g by mouth 2 (two) times daily. Patient taking differently: Take 17 g by mouth daily. 04/07/21   Enzo Bi, MD  potassium citrate (UROCIT-K) 10 MEQ (1080 MG) SR tablet TAKE 1 TABLET (10 MEQ TOTAL) BY MOUTH 3 (THREE) TIMES DAILY WITH MEALS. 11/18/21   Stoioff, Ronda Fairly, MD  senna-docusate (SENOKOT-S) 8.6-50 MG tablet Take 1 tablet by mouth 2 (two) times daily. 03/17/21   Nicole Kindred A, DO  tamsulosin (FLOMAX) 0.4 MG CAPS capsule TAKE 1 CAPSULE BY MOUTH EVERY DAY 04/13/21   Abbie Sons, MD    Physical Exam: Vitals:   06/10/22 1127 06/10/22 1330 06/10/22 1459 06/10/22 1500  BP: (!) 166/92 (!) 167/82  (!) 152/77  Pulse: 65 (!) 52  (!) 57  Resp: '17 18  17  ' Temp:   98 F (36.7 C)   TempSrc:   Oral   SpO2: 94% 99%  95%  Weight:      Height:         Vitals:   06/10/22 1127 06/10/22 1330 06/10/22 1459 06/10/22 1500  BP: (!) 166/92 (!) 167/82  (!) 152/77  Pulse: 65 (!) 52  (!) 57  Resp: '17 18  17  ' Temp:   98 F (36.7 C)   TempSrc:   Oral   SpO2: 94% 99%  95%  Weight:      Height:      Constitutional: NAD, calm, comfortable Eyes: PERRL, lids and conjunctivae normal ENMT: Mucous membranes are moist. Posterior pharynx clear of any exudate or lesions.Normal dentition.  Neck: normal, supple, no masses, no thyromegaly Respiratory: clear to auscultation bilaterally, no wheezing, no crackles. Normal respiratory effort. No accessory muscle use.  Cardiovascular: Regular rate and rhythm, no murmurs / rubs / gallops. No extremity edema. 2+ pedal pulses. Abdomen: no tenderness, no masses palpated. No hepatosplenomegaly. Bowel sounds positive.  Musculoskeletal: no clubbing /  cyanosis. Pain on rom of left lower extremity, no contractures. Normal muscle tone.   Skin: no rashes, lesions, ulcers. No induration/scalp laceration  Neurologic: CN 2-12 grossly intact. Sensation intact,l. Strength 5/5 in all 4 except left  4/5 due to pain  Psychiatric: Normal judgment and insight. Alert and oriented x 3. Normal mood.    Labs on Admission: I have personally reviewed following labs and imaging studies  CBC: Recent Labs  Lab 06/10/22 1152  WBC 10.6*  NEUTROABS 8.6*  HGB 11.8*  HCT 37.2*  MCV 100.5*  PLT 81*   Basic Metabolic Panel: Recent Labs  Lab 06/10/22 1152  NA 139  K 4.0  CL 105  CO2 24  GLUCOSE 117*  BUN 27*  CREATININE 1.06  CALCIUM 9.2   GFR: Estimated Creatinine Clearance: 48.4 mL/min (by C-G formula based on SCr of 1.06 mg/dL). Liver Function Tests: Recent Labs  Lab 06/10/22 1152  AST 29  ALT 16  ALKPHOS 65  BILITOT 0.8  PROT 5.7*  ALBUMIN 3.4*   No results for input(s): "LIPASE", "AMYLASE" in the last 168 hours. No results for input(s): "AMMONIA" in the last 168 hours. Coagulation Profile: No results for input(s): "INR", "PROTIME" in the last 168 hours. Cardiac Enzymes: No results for input(s): "CKTOTAL", "CKMB", "CKMBINDEX", "TROPONINI" in the last 168 hours. BNP (last 3 results) No results for input(s): "PROBNP" in the last 8760 hours. HbA1C: No results for input(s): "HGBA1C" in the last 72 hours. CBG: No results for input(s): "GLUCAP" in the last 168 hours. Lipid Profile: No results for input(s): "CHOL", "HDL", "LDLCALC", "TRIG", "CHOLHDL", "LDLDIRECT" in the last 72 hours. Thyroid Function Tests: No results for input(s): "TSH", "T4TOTAL", "FREET4", "T3FREE", "THYROIDAB" in the last 72 hours. Anemia Panel: No results for input(s): "VITAMINB12", "FOLATE", "FERRITIN", "TIBC", "IRON", "RETICCTPCT" in the last 72 hours. Urine analysis:    Component Value Date/Time   COLORURINE YELLOW (A) 06/10/2022 1152   APPEARANCEUR CLEAR (A) 06/10/2022 1152   APPEARANCEUR Cloudy (A) 06/11/2021 1121   LABSPEC 1.018  06/10/2022 1152   PHURINE 5.0 06/10/2022 1152   GLUCOSEU 150 (A) 06/10/2022 1152   HGBUR NEGATIVE 06/10/2022 1152   BILIRUBINUR NEGATIVE 06/10/2022 1152   BILIRUBINUR Negative 06/11/2021 1121   KETONESUR NEGATIVE 06/10/2022 1152   PROTEINUR 30 (A) 06/10/2022 1152   NITRITE NEGATIVE 06/10/2022 1152   LEUKOCYTESUR NEGATIVE 06/10/2022 1152    Radiological Exams on Admission: CT Head Wo Contrast  Result Date: 06/10/2022 CLINICAL DATA:  Provided history: Abnormal olfaction (CN 1). Neck trauma, abnormal mental status or neuro exam. Additional history provided: Fall. Laceration on top of head. Patient on Eliquis. None. EXAM: CT HEAD WITHOUT CONTRAST CT CERVICAL SPINE WITHOUT CONTRAST TECHNIQUE: Multidetector CT imaging of the head and cervical spine was performed following the standard protocol without intravenous contrast. Multiplanar CT image reconstructions of the cervical spine were also generated. RADIATION DOSE REDUCTION: This exam was performed according to the departmental dose-optimization program which includes automated exposure control, adjustment of the mA and/or kV according to patient size and/or use of iterative reconstruction technique. COMPARISON:  None Available. FINDINGS: CT HEAD FINDINGS Brain: Mild generalized parenchymal atrophy. There is no acute intracranial hemorrhage. No demarcated cortical infarct. No extra-axial fluid collection. No evidence of an intracranial mass. No midline shift. Vascular: No hyperdense vessel.  Atherosclerotic calcifications. Skull: No fracture or aggressive osseous lesion. Sinuses/Orbits: Mucosal thickening, and small mucous retention cysts, within the right maxillary sinus. Minimal mucosal thickening within the left maxillary sinus. Minimal mucosal thickening within  the bilateral ethmoid and frontal sinuses. Other: Left parietal scalp hematoma. CT CERVICAL SPINE FINDINGS Alignment: Slight grade 1 anterolisthesis at C4-C5. 3 mm C5-C6 grade 1  anterolisthesis. Slight grade 1 anterolisthesis at C6-C7. Skull base and vertebrae: The basion-dental and atlanto-dental intervals are maintained.No evidence of acute fracture to the cervical spine. Soft tissues and spinal canal: No prevertebral fluid or swelling. No visible canal hematoma. Disc levels: Cervical spondylosis with multilevel disc space narrowing, disc bulges/central disc protrusions, uncovertebral hypertrophy and facet arthrosis. No appreciable high-grade spinal canal stenosis. Multilevel bony neural foraminal narrowing. Degenerative changes are also present about the C1-C2 articulation. Upper chest: No consolidation within the imaged lung apices. No visible pneumothorax. IMPRESSION: CT head: 1. No evidence of acute intracranial abnormality. 2. Left parietal scalp hematoma. 3. Mild generalized parenchymal atrophy. 4. Mild paranasal sinus disease, as described. CT cervical spine: 1. No evidence of acute fracture to the cervical spine. 2. Grade 1 anterolisthesis at C4-C5, C5-C6 and C6-C7. 3. Cervical spondylosis, as described. Electronically Signed   By: Kellie Simmering D.O.   On: 06/10/2022 10:10   CT Cervical Spine Wo Contrast  Result Date: 06/10/2022 CLINICAL DATA:  Provided history: Abnormal olfaction (CN 1). Neck trauma, abnormal mental status or neuro exam. Additional history provided: Fall. Laceration on top of head. Patient on Eliquis. None. EXAM: CT HEAD WITHOUT CONTRAST CT CERVICAL SPINE WITHOUT CONTRAST TECHNIQUE: Multidetector CT imaging of the head and cervical spine was performed following the standard protocol without intravenous contrast. Multiplanar CT image reconstructions of the cervical spine were also generated. RADIATION DOSE REDUCTION: This exam was performed according to the departmental dose-optimization program which includes automated exposure control, adjustment of the mA and/or kV according to patient size and/or use of iterative reconstruction technique. COMPARISON:  None  Available. FINDINGS: CT HEAD FINDINGS Brain: Mild generalized parenchymal atrophy. There is no acute intracranial hemorrhage. No demarcated cortical infarct. No extra-axial fluid collection. No evidence of an intracranial mass. No midline shift. Vascular: No hyperdense vessel.  Atherosclerotic calcifications. Skull: No fracture or aggressive osseous lesion. Sinuses/Orbits: Mucosal thickening, and small mucous retention cysts, within the right maxillary sinus. Minimal mucosal thickening within the left maxillary sinus. Minimal mucosal thickening within the bilateral ethmoid and frontal sinuses. Other: Left parietal scalp hematoma. CT CERVICAL SPINE FINDINGS Alignment: Slight grade 1 anterolisthesis at C4-C5. 3 mm C5-C6 grade 1 anterolisthesis. Slight grade 1 anterolisthesis at C6-C7. Skull base and vertebrae: The basion-dental and atlanto-dental intervals are maintained.No evidence of acute fracture to the cervical spine. Soft tissues and spinal canal: No prevertebral fluid or swelling. No visible canal hematoma. Disc levels: Cervical spondylosis with multilevel disc space narrowing, disc bulges/central disc protrusions, uncovertebral hypertrophy and facet arthrosis. No appreciable high-grade spinal canal stenosis. Multilevel bony neural foraminal narrowing. Degenerative changes are also present about the C1-C2 articulation. Upper chest: No consolidation within the imaged lung apices. No visible pneumothorax. IMPRESSION: CT head: 1. No evidence of acute intracranial abnormality. 2. Left parietal scalp hematoma. 3. Mild generalized parenchymal atrophy. 4. Mild paranasal sinus disease, as described. CT cervical spine: 1. No evidence of acute fracture to the cervical spine. 2. Grade 1 anterolisthesis at C4-C5, C5-C6 and C6-C7. 3. Cervical spondylosis, as described. Electronically Signed   By: Kellie Simmering D.O.   On: 06/10/2022 10:10   DG Hip Unilat W or Wo Pelvis 2-3 Views Left  Result Date: 06/10/2022 CLINICAL  DATA:  Fall, left hip pain EXAM: DG HIP (WITH OR WITHOUT PELVIS) 2-3V LEFT COMPARISON:  None Available. FINDINGS:  Vertebroplasty material overlies chronic L4 compression fracture. Hernia repair mesh overlies the left pelvis. Acute nondisplaced fracture of the lateral aspect of the left superior pubic ramus, potentially extending into the medial wall of the left acetabulum. Acute nondisplaced fracture of the medial aspect of the inferior left pubic ramus extending into the left pubic symphysis. No hip dislocation. Mild bilateral hip osteoarthritis. No suspicious focal osseous lesions. IMPRESSION: Acute nondisplaced fracture of the lateral aspect of the left superior pubic ramus, potentially extending into the medial wall of the left acetabulum. Acute nondisplaced fracture of the medial aspect of the inferior left pubic ramus extending into the left pubic symphysis. Electronically Signed   By: Ilona Sorrel M.D.   On: 06/10/2022 10:04    EKG: Independently reviewed. See above   Assessment/Plan  Pelvic fracture s/p fall -noted ? Acetabular involvement  -ct pelvic pending  -PT/OT  -ortho consult based on result fo CT pelvis  -supportive pain medication  Presyncope due to Orthostasis -per patient with prior episode one year ago due to dehydration -continue with ivfs  -repeat orthostatic bp in am  -hold anti-htn medications overnight    HTN -due to orthostasis will hold bp medication  -can resume in am in orthostasis as resolved   CKDIIIb -stable   A. fib  -resume in am Eliquis -   GERD -ppi  BPH -hold flomax for now    HLD -diet controlled    Multiple myeloma    -continue oral chemo    Chronic compression fracture  -with chronic back pain due to multiple myeloma  -resume chronic pain medications    DVT prophylaxis: scd on full AC Code Status: DNR Family Communication: none at beside currently  Disposition Plan: patient  expected to be admitted greater than 2 midnights   Consults called:  n/a  Admission status: inpatient Sharene Butters   Clance Boll MD [facility]   If 7PM-7AM, please contact night-coverage [email or web address] Password [facility]  06/10/2022, 3:06 PM

## 2022-06-10 NOTE — ED Provider Notes (Signed)
[facility] Provider Note    Event Date/Time   First MD Initiated Contact with Patient 06/10/22 1113     (approximate)   History   Fall   HPI  Jack [name] is a 88 y.o. male  here with fall. Pt has h/o HTN, CKD, Afib on eliquis, and reportedly was walking to bathroom when he slipped. He landed onto his left hip and has since had left pelvic pain and some head pain at a site where he struck the ground. He is on anticoagulation. No LOC. Reports he was able to walk with minimal pain. No fever, chills. No cough, SOB. No other complaints. No weakness, numbness.      Physical Exam   Triage Vital Signs: ED Triage Vitals  Enc Vitals Group     BP 06/10/22 0929 (!) 159/83     Pulse Rate 06/10/22 0929 64     Resp 06/10/22 0929 18     Temp 06/10/22 0929 98 F (36.7 C)     Temp Source 06/10/22 0929 Oral     SpO2 06/10/22 0929 96 %     Weight 06/10/22 0925 175 lb (79.4 kg)     Height 06/10/22 0925 '5\' 8"'$  (1.727 m)     Head Circumference --      Peak Flow --      Pain Score 06/10/22 0924 2     Pain Loc --      Pain Edu? --      Excl. in [place]? --     Most recent vital signs: Vitals:   06/10/22 1906 06/10/22 1929  BP:  (!) 147/80  Pulse:  (!) 57  Resp:  18  Temp: 97.7 F (36.5 C) 98.7 F (37.1 C)  SpO2:  93%     General: Awake, no distress.  CV:  Good peripheral perfusion. RRR. No murmurs, rubs, gallops. Resp:  Normal effort. Lungs CTAB. Abd:  No distention. No tenderness. Other:  Approx 4 cm curvilinear laceration to posterior scalp with no active bleeding. No deformity. Moderate TTP over L anterior pelvis with no hip deformity. Strength 5/5 bl UE and LE. Normal sensation to light touch.   ED Results / Procedures / Treatments   Labs (all labs ordered are listed, but only abnormal results are displayed) Labs Reviewed  CBC WITH DIFFERENTIAL/PLATELET - Abnormal; Notable for the following components:      Result Value   WBC 10.6 (*)    RBC  3.70 (*)    Hemoglobin 11.8 (*)    HCT 37.2 (*)    MCV 100.5 (*)    RDW 16.0 (*)    Platelets 81 (*)    Neutro Abs 8.6 (*)    Abs Immature Granulocytes 0.12 (*)    All other components within normal limits  COMPREHENSIVE METABOLIC PANEL - Abnormal; Notable for the following components:   Glucose, Bld 117 (*)    BUN 27 (*)    Total Protein 5.7 (*)    Albumin 3.4 (*)    All other components within normal limits  BRAIN NATRIURETIC PEPTIDE - Abnormal; Notable for the following components:   B Natriuretic Peptide 160.7 (*)    All other components within normal limits  URINALYSIS, ROUTINE W REFLEX MICROSCOPIC - Abnormal; Notable for the following components:   Color, Urine YELLOW (*)    APPearance CLEAR (*)    Glucose, UA 150 (*)    Protein, ur 30 (*)    Bacteria, UA RARE (*)  All other components within normal limits  D-DIMER, QUANTITATIVE - Abnormal; Notable for the following components:   D-Dimer, Quant 3.68 (*)    All other components within normal limits  CBC  COMPREHENSIVE METABOLIC PANEL  TROPONIN I (HIGH SENSITIVITY)  TROPONIN I (HIGH SENSITIVITY)     EKG    RADIOLOGY CXR: Clear DG Hip: Left superior and inferior pubic ramus fx, acetabular fx CT Hip: pending   I also independently reviewed and agree with radiologist interpretations.   PROCEDURES:  Critical Care performed: No  ..Laceration Repair  Date/Time: 06/10/2022 7:52 PM  Performed by: Duffy Bruce, MD Authorized by: Duffy Bruce, MD   Consent:    Consent obtained:  Verbal   Consent given by:  Patient   Risks discussed:  Infection, need for additional repair, pain, tendon damage, retained foreign body, vascular damage, poor cosmetic result, poor wound healing and nerve damage   Alternatives discussed:  Referral and delayed treatment Universal protocol:    Imaging studies available: yes     Patient identity confirmed:  Verbally with patient Anesthesia:    Anesthesia method:  Local  infiltration   Local anesthetic:  Lidocaine 1% WITH epi Laceration details:    Location:  Scalp   Scalp location:  L parietal   Length (cm):  4 Pre-procedure details:    Preparation:  Patient was prepped and draped in usual sterile fashion and imaging obtained to evaluate for foreign bodies Exploration:    Hemostasis achieved with:  Direct pressure   Wound exploration: wound explored through full range of motion     Wound extent: no areolar tissue violation noted, no fascia violation noted, no foreign bodies/material noted, no muscle damage noted, no nerve damage noted, no tendon damage noted, no underlying fracture noted and no vascular damage noted   Treatment:    Area cleansed with:  Betadine   Amount of cleaning:  Extensive   Irrigation solution:  Sterile water   Irrigation method:  Pressure wash Skin repair:    Repair method:  Staples   Number of staples:  7 Approximation:    Approximation:  Close Repair type:    Repair type:  Simple Post-procedure details:    Dressing:  Antibiotic ointment   Procedure completion:  Tolerated well, no immediate complications     MEDICATIONS ORDERED IN ED: Medications  morphine (MS CONTIN) 12 hr tablet 30 mg (has no administration in time range)  morphine (MSIR) tablet 15 mg (15 mg Oral Given 06/10/22 1628)  acetaminophen (TYLENOL) tablet 500-1,000 mg (has no administration in time range)  apixaban (ELIQUIS) tablet 5 mg (has no administration in time range)  lenalidomide (REVLIMID) capsule 20 mg (20 mg Oral Not Given 06/10/22 1843)  gabapentin (NEURONTIN) capsule 600 mg (has no administration in time range)  feeding supplement (ENSURE ENLIVE / ENSURE PLUS) liquid 237 mL (237 mLs Oral Not Given 06/10/22 1613)  sodium chloride flush (NS) 0.9 % injection 3 mL (3 mLs Intravenous Not Given 06/10/22 1543)  0.9 %  sodium chloride infusion ( Intravenous New Bag/Given 06/10/22 1619)  ondansetron (ZOFRAN) tablet 4 mg (has no administration in time  range)    Or  ondansetron (ZOFRAN) injection 4 mg (has no administration in time range)  polyethylene glycol (MIRALAX / GLYCOLAX) packet 17 g (17 g Oral Not Given 06/10/22 1543)  lidocaine-EPINEPHrine (XYLOCAINE W/EPI) 2 %-1:100000 (with pres) injection 20 mL (20 mLs Intradermal Given 06/10/22 1151)  morphine (MS CONTIN) 12 hr tablet 30 mg (30 mg Oral Given 06/10/22  1150)  acetaminophen (TYLENOL) tablet 1,000 mg (1,000 mg Oral Given 06/10/22 1150)  ibuprofen (ADVIL) tablet 400 mg (400 mg Oral Given 06/10/22 1150)     IMPRESSION / MDM / ASSESSMENT AND PLAN / ED COURSE  I reviewed the triage vital signs and the nursing notes.                               The patient is on the cardiac monitor to evaluate for evidence of arrhythmia and/or significant heart rate changes.   Ddx:  Differential includes the following, with pertinent life- or limb-threatening emergencies considered:  Mechanical fall, fall due to generalized weakness from anemia, UTI, metabolic derangement, orthostasis  Patient's presentation is most consistent with acute presentation with potential threat to life or bodily function.  MDM:  88 yo M with MM here with scalp laceration, pelvis pain. Fall mechanical in nature. Pt is neuro intact. Distal NV is intact. No limb shortening or rotation. Tetanus utd. Lac repaired as above, tolerated well. CT and XR reviewed, show left pelvic fx, femur appears wnl. Will admit for pain control, PT. Minimal leukocytosis noted likely from his MM/baseline dyscrasia, labs o/w reassuring. Cr normal. Will admit to medicine.   MEDICATIONS GIVEN IN ED: Medications  morphine (MS CONTIN) 12 hr tablet 30 mg (has no administration in time range)  morphine (MSIR) tablet 15 mg (15 mg Oral Given 06/10/22 1628)  acetaminophen (TYLENOL) tablet 500-1,000 mg (has no administration in time range)  apixaban (ELIQUIS) tablet 5 mg (has no administration in time range)  lenalidomide (REVLIMID) capsule 20 mg (20  mg Oral Not Given 06/10/22 1843)  gabapentin (NEURONTIN) capsule 600 mg (has no administration in time range)  feeding supplement (ENSURE ENLIVE / ENSURE PLUS) liquid 237 mL (237 mLs Oral Not Given 06/10/22 1613)  sodium chloride flush (NS) 0.9 % injection 3 mL (3 mLs Intravenous Not Given 06/10/22 1543)  0.9 %  sodium chloride infusion ( Intravenous New Bag/Given 06/10/22 1619)  ondansetron (ZOFRAN) tablet 4 mg (has no administration in time range)    Or  ondansetron (ZOFRAN) injection 4 mg (has no administration in time range)  polyethylene glycol (MIRALAX / GLYCOLAX) packet 17 g (17 g Oral Not Given 06/10/22 1543)  lidocaine-EPINEPHrine (XYLOCAINE W/EPI) 2 %-1:100000 (with pres) injection 20 mL (20 mLs Intradermal Given 06/10/22 1151)  morphine (MS CONTIN) 12 hr tablet 30 mg (30 mg Oral Given 06/10/22 1150)  acetaminophen (TYLENOL) tablet 1,000 mg (1,000 mg Oral Given 06/10/22 1150)  ibuprofen (ADVIL) tablet 400 mg (400 mg Oral Given 06/10/22 1150)     Consults:     EMR reviewed       FINAL CLINICAL IMPRESSION(S) / ED DIAGNOSES   Final diagnoses:  Fall, initial encounter  Laceration of scalp, initial encounter  Closed nondisplaced fracture of left pubis, initial encounter ([place])     Rx / DC Orders   ED Discharge Orders     None        Note:  This document was prepared using Dragon voice recognition software and may include unintentional dictation errors.   Duffy Bruce, MD 06/10/22 [phone]

## 2022-06-10 NOTE — ED Triage Notes (Addendum)
Patient to ED via [facility] from home for a fall. Patient he got up and started walking got dizzy and fell. Laceration noted on top of head- bleeding controlled. Patient is on eliquis but denies LOC. Positive orthostatics per EMS. Pt c/o left hip pain. Patient on 2L [place] by EMS due to inial O2 90%. Patient in C collar from EMS.

## 2022-06-11 ENCOUNTER — Inpatient Hospital Stay (HOSPITAL_COMMUNITY)
Admit: 2022-06-11 | Discharge: 2022-06-11 | Disposition: A | Discharge status: Home / Self Care | Payer: PPO | Attending: Internal Medicine | Admitting: Internal Medicine

## 2022-06-11 DIAGNOSIS — S32502A Unspecified fracture of left pubis, initial encounter for closed fracture: Secondary | ICD-10-CM | POA: Diagnosis not present

## 2022-06-11 DIAGNOSIS — R55 Syncope and collapse: Secondary | ICD-10-CM

## 2022-06-11 DIAGNOSIS — C9001 Multiple myeloma in remission: Secondary | ICD-10-CM

## 2022-06-11 DIAGNOSIS — R42 Dizziness and giddiness: Secondary | ICD-10-CM | POA: Diagnosis not present

## 2022-06-11 DIAGNOSIS — I4821 Permanent atrial fibrillation: Secondary | ICD-10-CM | POA: Diagnosis not present

## 2022-06-11 LAB — COMPREHENSIVE METABOLIC PANEL
ALT: 10 U/L (ref 0–44)
AST: 19 U/L (ref 15–41)
Albumin: 2.8 g/dL — ABNORMAL LOW (ref 3.5–5.0)
Alkaline Phosphatase: 48 U/L (ref 38–126)
Anion gap: 2 — ABNORMAL LOW (ref 5–15)
BUN: 29 mg/dL — ABNORMAL HIGH (ref 8–23)
CO2: 24 mmol/L (ref 22–32)
Calcium: 7.9 mg/dL — ABNORMAL LOW (ref 8.9–10.3)
Chloride: 112 mmol/L — ABNORMAL HIGH (ref 98–111)
Creatinine, Ser: 1.13 mg/dL (ref 0.61–1.24)
GFR, Estimated: >60 mL/min (ref >60)
Glucose, Bld: 96 mg/dL (ref 70–99)
Potassium: 4 mmol/L (ref 3.5–5.1)
Sodium: 138 mmol/L (ref 135–145)
Total Bilirubin: 0.9 mg/dL (ref 0.3–1.2)
Total Protein: 4.7 g/dL — ABNORMAL LOW (ref 6.5–8.1)

## 2022-06-11 LAB — CBC
HCT: 29.8 % — ABNORMAL LOW (ref 39.0–52.0)
Hemoglobin: 9.6 g/dL — ABNORMAL LOW (ref 13.0–17.0)
MCH: 32.3 pg (ref 26.0–34.0)
MCHC: 32.2 g/dL (ref 30.0–36.0)
MCV: 100.3 fL — ABNORMAL HIGH (ref 80.0–100.0)
Platelets: 57 10*3/uL — ABNORMAL LOW (ref 150–400)
RBC: 2.97 MIL/uL — ABNORMAL LOW (ref 4.22–5.81)
RDW: 16.2 % — ABNORMAL HIGH (ref 11.5–15.5)
WBC: 5.5 10*3/uL (ref 4.0–10.5)
nRBC: 0 % (ref 0.0–0.2)

## 2022-06-11 LAB — ECHOCARDIOGRAM COMPLETE
AR max vel: 2.4 cm2
AV Area VTI: 2.42 cm2
AV Area mean vel: 2.32 cm2
AV Mean grad: 5 mmHg
AV Peak grad: 9.5 mmHg
Ao pk vel: 1.55 m/s
Area-P 1/2: 3.93 cm2
Calc EF: 50.3 %
Height: 68 in
P 1/2 time: 517 msec
S' Lateral: 2.4 cm
Single Plane A2C EF: 36.6 %
Single Plane A4C EF: 56.3 %
Weight: 2800 oz

## 2022-06-11 MED ORDER — SENNOSIDES-DOCUSATE SODIUM 8.6-50 MG PO TABS
1.0000 | ORAL_TABLET | Freq: Two times a day (BID) | ORAL | Status: DC
  Administered 2022-06-11 – 2022-06-15 (×8): 1 via ORAL
  Filled 2022-06-11 (×8): qty 1

## 2022-06-11 MED ORDER — POLYETHYLENE GLYCOL 3350 17 G PO PACK
17.0000 g | PACK | Freq: Two times a day (BID) | ORAL | Status: DC
  Administered 2022-06-11 – 2022-06-13 (×4): 17 g via ORAL
  Filled 2022-06-11 (×4): qty 1

## 2022-06-11 MED ORDER — LENALIDOMIDE 20 MG PO CAPS
20.0000 mg | ORAL_CAPSULE | Freq: Every day | ORAL | Status: DC
  Administered 2022-06-11 – 2022-06-14 (×4): 20 mg via ORAL
  Filled 2022-06-11 (×4): qty 1

## 2022-06-11 NOTE — Assessment & Plan Note (Signed)
Seen by Ortho.  He has nondisplaced left superior pubic ramus fracture extending into the acetabulum and nondisplaced inferior pubic ramus fracture.  Ortho recommends conservative management with pain control and starting physical therapy.  Partial protected weightbearing status and outpatient Ortho follow-up in 2 weeks  PT and OT recommends SNF.  TOC working on placement

## 2022-06-11 NOTE — Evaluation (Signed)
Physical Therapy Evaluation Patient Details Name: Jack [name] MRN: [id number] DOB: September 24, 1932 Today's Date: 06/11/2022  History of Present Illness  Jack [name] is a 88 y.o. male  here with fall. Pt has h/o HTN, CKD, Afib on eliquis, and reportedly was walking to bathroom when he slipped. He landed onto his left hip and has since had left pelvic pain and some head pain at a site where he struck the ground. He is on anticoagulation. No LOC. Reports he was able to walk with minimal pain. No fever, chills. No cough, SOB. No other complaints. No weakness, numbness. Per orthopedics, 25-50% WB'ing on LLE allowed.   Clinical Impression  Pt admitted with above diagnosis. Pt received upright in recliner agreeable to PT services.  Reports at baseline, ambulates without AD in household. Relies on 4WW outside of home with community distances. Pt is indep with ADL's but relies on assist from wife for IADL's. Wife unable to provide physical assist due to back problems. To date, pt reports pain as mild at rest. Able to transfer with bed elevated and min VC's for LLE positioning and increased time to sit EOB. Pt denies dizziness but demonstrates positive orthostatics with systolic and diastolic reading. Pt educated on WB precautions on LLE. Pt demonstrates ability to STS with safe use of RW and maintaining WB'ing precautions on LLE without physical assist. Then able to step pivot safely to recliner with min VC's for RW positioning. Unable to progress gait this date due to pt requesting to eat breakfast. Anticipate progression of gait with LRAD and maintaining LLE WB precautions without physical assist and performing safe household distances, pt could progress to home with [facility] PT. Pt in recliner with all needs in reach. Education on elevation of limb for edema management and [place] AROM for hip and circulation. Pt verbalizing understanding. Pt currently with functional limitations due to the deficits listed below (see PT  Problem List). Pt will benefit from skilled PT to increase their independence and safety with mobility to allow discharge to the venue listed below.      Recommendations for follow up therapy are one component of a multi-disciplinary discharge planning process, led by the attending physician.  Recommendations may be updated based on patient status, additional functional criteria and insurance authorization.  Follow Up Recommendations Skilled nursing-short term rehab (<3 hours/day) Can patient physically be transported by private vehicle: No    Assistance Recommended at Discharge Intermittent Supervision/Assistance  Patient can return home with the following  A little help with walking and/or transfers;A little help with bathing/dressing/bathroom;Assistance with cooking/housework;Assist for transportation;Help with stairs or ramp for entrance    Equipment Recommendations Rolling walker (2 wheels)  Recommendations for Other Services       Functional Status Assessment Patient has had a recent decline in their functional status and demonstrates the ability to make significant improvements in function in a reasonable and predictable amount of time.     Precautions / Restrictions Precautions Precautions: Fall Restrictions Weight Bearing Restrictions: Yes LLE Weight Bearing: Partial weight bearing LLE Partial Weight Bearing Percentage or Pounds: 25-50% per orthopedic MD note      Mobility  Bed Mobility Overal bed mobility: Needs Assistance Bed Mobility: Supine to Sit     Supine to sit: HOB elevated, Supervision     General bed mobility comments: bed features, increased time. Performs without physical assist. Patient Response: Cooperative  Transfers Overall transfer level: Needs assistance Equipment used: Rolling walker (2 wheels) Transfers: Bed to chair/wheelchair/BSC Sit  to Stand: Min guard, From elevated surface   Step pivot transfers: Min guard, From elevated surface        General transfer comment: VC's for LLE placement/WB'ing, RW sequencing.    Ambulation/Gait                  Stairs            Wheelchair Mobility    Modified Rankin (Stroke Patients Only)       Balance Overall balance assessment: Needs assistance Sitting-balance support: Bilateral upper extremity supported, Feet supported Sitting balance-Leahy Scale: Good     Standing balance support: Bilateral upper extremity supported Standing balance-Leahy Scale: Poor Standing balance comment: Relies heavily on UE support on RW due to LLE WB precautions. Steady however in standing                             Pertinent Vitals/Pain Pain Assessment Pain Assessment: Faces Faces Pain Scale: Hurts a little bit Pain Location: LLE Pain Descriptors / Indicators: Grimacing, Discomfort Pain Intervention(s): Limited activity within patient's tolerance, Monitored during session, Repositioned, Premedicated before session    [place] expects to be discharged to:: Private residence Living Arrangements: Spouse/significant other Available Help at Discharge: Family;Available 24 hours/day Type of Home: House Home Access: Stairs to enter Entrance Stairs-Rails: None Entrance Stairs-Number of Steps: 1   Home Layout: One level Home Equipment: Rollator (4 wheels);Cane - single point;Shower seat;Grab bars - tub/shower;Toilet riser Additional Comments: lift chair    Prior Function Prior Level of Function : Needs assist       Physical Assist : ADLs (physical)   ADLs (physical): IADLs Mobility Comments: no physical assist needed. More IADL's such as med management, driving.       Hand Dominance   Dominant Hand: Right    Extremity/Trunk Assessment   Upper Extremity Assessment Upper Extremity Assessment: Overall WFL for tasks assessed    Lower Extremity Assessment Lower Extremity Assessment: Generalized weakness;LLE deficits/detail LLE Deficits  / Details: PWB on LLE       Communication   Communication: No difficulties  Cognition Arousal/Alertness: Awake/alert Behavior During Therapy: WFL for tasks assessed/performed Overall Cognitive Status: Within Functional Limits for tasks assessed                                          General Comments General comments (skin integrity, edema, etc.): HR: upper 50's to mid 60's with activity.    Exercises General Exercises - Lower Extremity Ankle Circles/Pumps: AROM, Left, 10 reps, Supine Heel Slides: AROM, Strengthening, Left, 5 reps, Supine Hip ABduction/ADduction: AROM, Supine, Strengthening, Left, 5 reps Other Exercises Other Exercises: Role of PT in acute setting, d/c recs, WB precautions, strategies to maintain WB precautions, safe use of RW. Education on [place] LE therex for circulation, hip AROM.   Assessment/Plan    PT Assessment Patient needs continued PT services  PT Problem List Decreased strength;Decreased mobility;Decreased activity tolerance;Cardiopulmonary status limiting activity;Pain       PT Treatment Interventions DME instruction;Therapeutic exercise;Gait training;Balance training;Stair training;Neuromuscular re-education;Functional mobility training;Therapeutic activities;Patient/family education    PT Goals (Current goals can be found in the Care Plan section)  Acute Rehab PT Goals Patient Stated Goal: improve mobility PT Goal Formulation: With patient Time For Goal Achievement: 06/25/22 Potential to Achieve Goals: Good    Frequency 7X/week  Co-evaluation               AM-PAC PT "6 Clicks" Mobility  Outcome Measure Help needed turning from your back to your side while in a flat bed without using bedrails?: A Little Help needed moving from lying on your back to sitting on the side of a flat bed without using bedrails?: A Little Help needed moving to and from a bed to a chair (including a wheelchair)?: A Little Help needed  standing up from a chair using your arms (e.g., wheelchair or bedside chair)?: A Little Help needed to walk in hospital room?: A Lot Help needed climbing 3-5 steps with a railing? : Total 6 Click Score: 15    End of Session Equipment Utilized During Treatment: Gait belt Activity Tolerance: Patient tolerated treatment well Patient left: in chair;with call bell/phone within reach Nurse Communication: Mobility status PT Visit Diagnosis: Unsteadiness on feet (R26.81);History of falling (Z91.81);Other abnormalities of gait and mobility (R26.89);Muscle weakness (generalized) (M62.81);Pain Pain - Right/Left: Left Pain - part of body: Leg    Time: 0158-6825 PT Time Calculation (min) (ACUTE ONLY): 35 min   Charges:   PT Evaluation $PT Eval Moderate Complexity: 1 Mod PT Treatments $Gait Training: 8-22 mins        Salem Caster. Fairly IV, PT, DPT Physical Therapist- [facility]  06/11/2022, 10:14 AM

## 2022-06-11 NOTE — Assessment & Plan Note (Addendum)
Chronic issues and is managed by her outpatient providers.  Continue pain medicines

## 2022-06-11 NOTE — Assessment & Plan Note (Signed)
Stable and at baseline

## 2022-06-11 NOTE — Evaluation (Signed)
Occupational Therapy Evaluation Patient Details Name: Jack [name] MRN: [id number] DOB: [birth year] Today's Date: 06/11/2022   History of Present Illness Jack [name] is a 88 y.o. male  here with fall. Pt has h/o HTN, CKD, Afib on eliquis, and reportedly was walking to bathroom when he slipped. He landed onto his left hip and has since had left pelvic pain and some head pain at a site where he struck the ground. He is on anticoagulation. No LOC. Reports he was able to walk with minimal pain. No fever, chills. No cough, SOB. No other complaints. No weakness, numbness. Per orthopedics, 25-50% WB'ing on LLE allowed.   Clinical Impression   Patient presenting with decreased independence in self-care, functional mobility, safety, and endurance. Patient on [facility] upon arrival, agreeable to OT services. Wife present and assisting with providing info. Reports the wife is not able to provide any physical assistance due to back issues; provides assistance with medication. Patient has a walk-in shower, RW, and rollator. Patient currently functioning at min guard for transfer sit>stand from [facility] ; min A for clothing management. Patient was able to ambulate ~32f forward and backwards with supervision. Patient with c/o increased pain. Patient was educated on WB and hip precautions; verbalized understanding. Anticipate progression of ambulation and maintaining WB precautions, pt could progress to home health OT. Patient left in recliner, chair alarm set, call bell in reach and wife present. Patient will benefit from acute OT to increase overall independence in the areas of ADLs, functional mobility,  in order to safely discharge to the next venue of care.       Recommendations for follow up therapy are one component of a multi-disciplinary discharge planning process, led by the attending physician.  Recommendations may be updated based on patient status, additional functional criteria and insurance authorization.    Follow Up Recommendations  Skilled nursing-short term rehab (<3 hours/day)    Assistance Recommended at Discharge Intermittent Supervision/Assistance  Patient can return home with the following A little help with walking and/or transfers;A little help with bathing/dressing/bathroom;Help with stairs or ramp for entrance;Assist for transportation;Assistance with cooking/housework    Functional Status Assessment  Patient has had a recent decline in their functional status and demonstrates the ability to make significant improvements in function in a reasonable and predictable amount of time.  Equipment Recommendations  BSC/3in1    Recommendations for Other Services       Precautions / Restrictions Precautions Precautions: Fall Restrictions Weight Bearing Restrictions: Yes LLE Weight Bearing: Partial weight bearing LLE Partial Weight Bearing Percentage or Pounds: 25-50% per orthopedic MD note      Mobility Bed Mobility               General bed mobility comments: Patient on BSC upon arrival.    Transfers Overall transfer level: Needs assistance Equipment used: Rolling walker (2 wheels) Transfers: Sit to/from Stand Sit to Stand: Min guard                  Balance Overall balance assessment: Needs assistance Sitting-balance support: Bilateral upper extremity supported, Feet supported Sitting balance-Leahy Scale: Good     Standing balance support: Bilateral upper extremity supported, Reliant on assistive device for balance Standing balance-Leahy Scale: Poor Standing balance comment: Relies heavily on UE support on RW due to LLE WB precautions. Steady however in standing  ADL either performed or assessed with clinical judgement   ADL Overall ADL's : Needs assistance/impaired                             Toileting- Clothing Manipulation and Hygiene: Minimal assistance                Pertinent  Vitals/Pain Pain Assessment Pain Assessment: 0-10 Pain Score: 8  Pain Location: LLE Pain Descriptors / Indicators: Grimacing, Discomfort Pain Intervention(s): Limited activity within patient's tolerance, Premedicated before session, Monitored during session, Repositioned     Hand Dominance Right   Extremity/Trunk Assessment Upper Extremity Assessment Upper Extremity Assessment: Overall WFL for tasks assessed   Lower Extremity Assessment Lower Extremity Assessment: Generalized weakness;LLE deficits/detail LLE Deficits / Details: PWB on LLE       Communication Communication Communication: No difficulties   Cognition Arousal/Alertness: Awake/alert Behavior During Therapy: WFL for tasks assessed/performed Overall Cognitive Status: Within Functional Limits for tasks assessed                                                  Home Living Family/patient expects to be discharged to:: Private residence Living Arrangements: Spouse/significant other Available Help at Discharge: Family;Available 24 hours/day Type of Home: House Home Access: Level entry Entrance Stairs-Number of Steps: 1 Entrance Stairs-Rails: None Home Layout: One level     Bathroom Shower/Tub: Occupational psychologist: Standard Bathroom Accessibility: Yes   Home Equipment: Rollator (4 wheels);Grab bars - tub/shower;Rolling Walker (2 wheels)   Additional Comments: lift chair.      Prior Functioning/Environment Prior Level of Function : Needs assist       Physical Assist : ADLs (physical)   ADLs (physical): IADLs Mobility Comments: no physical assist needed. More IADL's such as med management, driving.          OT Problem List: Decreased strength;Decreased activity tolerance;Decreased safety awareness;Pain      OT Treatment/Interventions: Self-care/ADL training;Therapeutic exercise;Patient/family education;Therapeutic activities;DME and/or AE instruction    OT  Goals(Current goals can be found in the care plan section) Acute Rehab OT Goals Patient Stated Goal: go to rehab OT Goal Formulation: With patient/family Time For Goal Achievement: 06/25/22 Potential to Achieve Goals: Good ADL Goals Pt Will Perform Lower Body Bathing: with modified independence Pt Will Perform Lower Body Dressing: with modified independence Pt Will Transfer to Toilet: with modified independence Pt Will Perform Toileting - Clothing Manipulation and hygiene: with modified independence  OT Frequency: Min 2X/week       AM-PAC OT "6 Clicks" Daily Activity     Outcome Measure Help from another person eating meals?: None Help from another person taking care of personal grooming?: A Lot Help from another person toileting, which includes using toliet, bedpan, or urinal?: A Lot Help from another person bathing (including washing, rinsing, drying)?: A Lot Help from another person to put on and taking off regular upper body clothing?: A Little Help from another person to put on and taking off regular lower body clothing?: A Lot 6 Click Score: 15   End of Session Equipment Utilized During Treatment: Rolling walker (2 wheels) Nurse Communication: Mobility status  Activity Tolerance: Patient limited by pain Patient left: in chair;with call bell/phone within reach;with family/visitor present;with chair alarm set  OT Visit Diagnosis: Unsteadiness on feet (  R26.81);History of falling (Z91.81);Pain;Muscle weakness (generalized) (M62.81) Pain - Right/Left: Left Pain - part of body: Hip                Time: 5361-4431 OT Time Calculation (min): 22 min Charges:  OT General Charges $OT Visit: 1 Visit OT Evaluation $OT Eval Moderate Complexity: 1 Mod OT Treatments $Therapeutic Activity: 8-22 mins  Jack [name], OTS 06/11/2022, 1:39 PM

## 2022-06-11 NOTE — Plan of Care (Signed)
  Problem: Education: Goal: Knowledge of condition and prescribed therapy will improve Outcome: Progressing   Problem: Cardiac: Goal: Will achieve and/or maintain adequate cardiac output Outcome: Progressing   Problem: Physical Regulation: Goal: Complications related to the disease process, condition or treatment will be avoided or minimized Outcome: Progressing   Problem: Education: Goal: Knowledge of General Education information will improve Description: Including pain rating scale, medication(s)/side effects and non-pharmacologic comfort measures Outcome: Progressing   Problem: Health Behavior/Discharge Planning: Goal: Ability to manage health-related needs will improve Outcome: Progressing   Problem: Clinical Measurements: Goal: Ability to maintain clinical measurements within normal limits will improve Outcome: Progressing Goal: Will remain free from infection Outcome: Progressing Goal: Diagnostic test results will improve Outcome: Progressing Goal: Cardiovascular complication will be avoided Outcome: Progressing   Problem: Activity: Goal: Risk for activity intolerance will decrease Outcome: Progressing   Problem: Nutrition: Goal: Adequate nutrition will be maintained Outcome: Progressing   Problem: Elimination: Goal: Will not experience complications related to bowel motility Outcome: Progressing Goal: Will not experience complications related to urinary retention Outcome: Progressing   Problem: Pain Managment: Goal: General experience of comfort will improve Outcome: Progressing   Problem: Safety: Goal: Ability to remain free from injury will improve Outcome: Progressing   Problem: Skin Integrity: Goal: Risk for impaired skin integrity will decrease Outcome: Progressing   Problem: Clinical Measurements: Goal: Respiratory complications will improve Outcome: Not Applicable   Problem: Coping: Goal: Level of anxiety will decrease Outcome: Not  Applicable

## 2022-06-11 NOTE — Assessment & Plan Note (Signed)
Positive orthostatics.  Holding Flomax and antihypertensive at that could be the etiology continue IV hydration and recheck orthostatics

## 2022-06-11 NOTE — Progress Notes (Signed)
*  PRELIMINARY RESULTS* Echocardiogram 2D Echocardiogram has been performed.  Jack [name] 06/11/2022, 12:24 PM

## 2022-06-11 NOTE — NC FL2 (Signed)
[place] LEVEL OF CARE SCREENING TOOL     IDENTIFICATION  Patient Name: Jack [name] Birthdate: 1933/06/29 Sex: male Admission Date (Current Location): 06/10/2022  [facility] and [place] Number:  Engineering geologist and Address:  [facility], [address] [place], [place] [address]      Provider Number: [id number]  Attending Physician Name and Address:  Max Sane, MD  Relative Name and Phone Number:  [name] wife [phone]    Current Level of Care: Hospital Recommended Level of Care: [place] Prior Approval Number:    Date Approved/Denied:   PASRR Number: [id number]  Discharge Plan: SNF    Current Diagnoses: Patient Active Problem List   Diagnosis Date Noted   Pelvic fracture ([place]) 06/10/2022   Multiple myeloma ([place]) 05/05/2021   Compression fracture of body of thoracic vertebra (HCC)    Acute hypoxemic respiratory failure ([place]) 03/26/2021   Right flank pain 03/26/2021   Multiple pathological fractures 03/14/2021   Kappa light chain myeloma ([place])    Back pain 03/11/2021   Atrial fibrillation ([place]) 03/11/2021   Compression fracture of L2 and L3 03/11/2021   Hyperlipidemia    CKD (chronic kidney disease), stage IV ([place])    Constipation    Palliative care encounter    Actinic keratosis 02/19/2021   Inguinal hernia 02/19/2021   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   Numbness of foot 02/19/2021   Sciatica 02/19/2021   Shoulder pain 02/19/2021   Palliative care patient 02/17/2021   Closed compression fracture of body of L1 vertebra ([place]) 02/07/2021   Protein-calorie malnutrition, severe 01/31/2021   Hypokalemia    Normocytic anemia    Atherosclerosis of aorta (HCC)    Irregular heart rhythm    T12 compression fracture ([place]) 01/02/2021   History of kidney stones    Acute kidney injury superimposed on CKD ([place])    Ureteral stone    Ureteral perforation secondary to stent  manipulation 10/16/2017   Abdominal pain 10/16/2017   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/26/2016   Hyperplasia, prostate 02/03/2015   Multinodular goiter 07/03/2014   Thyroid nodule 06/20/2014   Nontoxic uninodular goiter 06/20/2014   Edema 09/24/2013   Encounter for preventive health examination 08/21/2013   Personal history of colonic polyps 08/21/2013   History of venomous snake bite 08/21/2013   Cough 10/02/2012   Prostate hypertrophy    Elevated prostate specific antigen (PSA)    HYPERTRIGLYCERIDEMIA 12/21/2006   Essential hypertension 12/21/2006   GERD 12/21/2006   CALCULUS, KIDNEY 12/21/2006    Orientation RESPIRATION BLADDER Height & Weight     Self, Time, Situation, Place  Normal Continent Weight: 79.4 kg Height:  _0  (172.7 cm)  BEHAVIORAL SYMPTOMS/MOOD NEUROLOGICAL BOWEL NUTRITION STATUS      Continent Diet (see dc summary)  AMBULATORY STATUS COMMUNICATION OF NEEDS Skin   Extensive Assist Verbally Normal                       Personal Care Assistance Level of Assistance  Bathing, Feeding, Dressing Bathing Assistance: Limited assistance Feeding assistance: Independent Dressing Assistance: Maximum assistance     Functional Limitations Info             SPECIAL CARE FACTORS FREQUENCY  PT (By licensed PT), OT (By licensed OT)     PT Frequency: 5 times per week OT Frequency: 5 times per week            Contractures Contractures Info: Not  present    Additional Factors Info  Code Status, Allergies Code Status Info: DNR Allergies Info: Quinolones, Amlodipine, Azithromycin, Lipitor (Atorvastatin), Lisinopril, Zetia (Ezetimibe           Current Medications (06/11/2022):  This is the current hospital active medication list Current Facility-Administered Medications  Medication Dose Route Frequency Provider Last Rate Last Admin   0.9 %  sodium chloride infusion   Intravenous Continuous Clance Boll, MD 75 mL/hr at 06/11/22 0352 New Bag at 06/11/22  0352   acetaminophen (TYLENOL) tablet 500-1,000 mg  500-1,000 mg Oral Q8H PRN Clance Boll, MD   1,000 mg at 06/10/22 2012   apixaban (ELIQUIS) tablet 5 mg  5 mg Oral BID Myles Rosenthal A, MD   5 mg at 06/11/22 0920   feeding supplement (ENSURE ENLIVE / ENSURE PLUS) liquid 237 mL  237 mL Oral TID BM Myles Rosenthal A, MD   237 mL at 06/10/22 2046   gabapentin (NEURONTIN) capsule 600 mg  600 mg Oral BID Myles Rosenthal A, MD   600 mg at 06/11/22 0920   lenalidomide (REVLIMID) capsule 20 mg  20 mg Oral Daily Myles Rosenthal A, MD       morphine (MS CONTIN) 12 hr tablet 30 mg  30 mg Oral Q12H Myles Rosenthal A, MD   30 mg at 06/11/22 0920   morphine (MSIR) tablet 15 mg  15 mg Oral Q6H PRN Myles Rosenthal A, MD   15 mg at 06/11/22 1109   ondansetron (ZOFRAN) tablet 4 mg  4 mg Oral Q6H PRN Clance Boll, MD       Or   ondansetron [facility]) injection 4 mg  4 mg Intravenous Q6H PRN Clance Boll, MD       polyethylene glycol (MIRALAX / GLYCOLAX) packet 17 g  17 g Oral Daily Myles Rosenthal A, MD   17 g at 06/11/22 0920   sodium chloride flush (NS) 0.9 % injection 3 mL  3 mL Intravenous Q12H Clance Boll, MD   3 mL at 06/10/22 2133     Discharge Medications: Please see discharge summary for a list of discharge medications.  Relevant Imaging Results:  Relevant Lab Results:   Additional Information SSN [place], RN

## 2022-06-11 NOTE — Assessment & Plan Note (Signed)
Permanent A-fib.  Continue Eliquis for anticoagulation

## 2022-06-11 NOTE — Assessment & Plan Note (Signed)
Follows with oncology.  Continue treatment as an outpatient

## 2022-06-11 NOTE — Progress Notes (Addendum)
  Progress Note   Patient: Jack [name]:[id number] DOB: 10/13/1932 DOA: 06/10/2022     1 DOS: the patient was seen and examined on 06/11/2022   Brief hospital course:  88 y.o. male with medical history significant of  hypertension, CKD stage IIIb, A. fib on Eliquis, GERD,BPH, HLD, Multiple myeloma  , chronic compression fracture with chronic back pain due to multiple myeloma admitted for pelvic fracture status post fall  9/29: PT, OT eval recommends SNF.  Ortho recommends medical management   Assessment and Plan: * Pelvic fracture ([place]) Seen by Ortho.  He has nondisplaced left superior pubic ramus fracture extending into the acetabulum and nondisplaced inferior pubic ramus fracture.  Ortho recommends conservative management with pain control and starting physical therapy.  Partial protected weightbearing status and outpatient Ortho follow-up in 2 weeks  PT and OT recommends SNF.  TOC working on placement  Postural dizziness with presyncope Positive orthostatics.  Holding Flomax and antihypertensive at that could be the etiology continue IV hydration and recheck orthostatics  Multiple myeloma ([place]) Follows with oncology.  Continue treatment as an outpatient  Multiple pathological fractures Chronic issues and is managed by her outpatient providers.  Continue pain medicines  Atrial fibrillation (HCC) Permanent A-fib.  Continue Eliquis for anticoagulation  CKD (chronic kidney disease), stage IV (HCC)-resolved as of 06/11/2022 Stable and at baseline       Subjective: Sitting in the chair.  No new complaints  Physical Exam: Vitals:   06/10/22 2048 06/11/22 0008 06/11/22 0750 06/11/22 1531  BP: (!) 140/60 128/71 (!) 166/87 138/75  Pulse: (!) 51 (!) 55 (!) 55 (!) 58  Resp: 20 20    Temp: 97.8 F (36.6 C) 98.3 F (36.8 C) 98.4 F (36.9 C) 97.8 F (36.6 C)  TempSrc:      SpO2: 94% 94% 96% 94%  Weight:      Height:       88 year old male sitting in the chair  comfortably without any acute distress Lungs clear to auscultation bilaterally Cardiovascular irregularly irregular heart rate Abdomen soft, benign Neuro alert and awake, nonfocal Extremities: Tenderness around the left hip/upper thigh Skin no rash or lesion Psych normal mood and affect Data Reviewed:  Hemoglobin 9.6  Family Communication: Wife updated at bedside during rounds and again in the evening over phone  Disposition: Status is: Inpatient Remains inpatient appropriate because: Needs SNF placement, getting lower extremity Dopplers considering elevated D-dimer   Planned Discharge Destination: Skilled nursing facility   DVT prophylaxis-Eliquis Time spent: 35 minutes  Author: Max Sane, MD 06/11/2022 7:09 PM  For on call review [email or web address].

## 2022-06-11 NOTE — Hospital Course (Signed)
88 y.o. male with medical history significant of  hypertension, CKD stage IIIb, A. fib on Eliquis, GERD,BPH, HLD, Multiple myeloma  , chronic compression fracture with chronic back pain due to multiple myeloma admitted for pelvic fracture status post fall  9/29: PT, OT eval recommends SNF.  Ortho recommends medical management 9/30: Feeling constipated and hoping bowel regimen helps 10/1: Had good bowel movement.  Feeling better 10/2: SNF pending

## 2022-06-11 NOTE — TOC Progression Note (Signed)
Transition of Care [facility]) - Progression Note    Patient Details  Name: HENDRICKS SCHWANDT MRN: [id number] Date of Birth: 29-Jun-1933  Transition of Care [facility]) CM/SW [place], RN Phone Number: 06/11/2022, 11:27 AM  Clinical Narrative:    Patient and his wife are agreeable to go to [facility] SNF He has been in the past Bedsearch sent, PASSR obtained,  FL2 completed   Expected Discharge Plan: Home/Self Care Barriers to Discharge: Continued Medical Work up  Expected Discharge Plan and Services Expected Discharge Plan: Home/Self Care   Discharge Planning Services: CM Consult   Living arrangements for the past 2 months: Single Family Home                 DME Arranged: N/A         HH Arranged: NA           Social Determinants of Health (SDOH) Interventions    Readmission Risk Interventions    03/27/2021    1:02 PM  Readmission Risk Prevention Plan  Transportation Screening Complete  Medication Review Press photographer) Complete  PCP or Specialist appointment within 3-5 days of discharge Complete  HRI or [place] Complete  SW Recovery Care/Counseling Consult Complete  Palliative Care Screening [place] Patient Refused

## 2022-06-12 ENCOUNTER — Inpatient Hospital Stay: Payer: PPO

## 2022-06-12 DIAGNOSIS — M8440XS Pathological fracture, unspecified site, sequela: Secondary | ICD-10-CM

## 2022-06-12 DIAGNOSIS — K5900 Constipation, unspecified: Secondary | ICD-10-CM | POA: Diagnosis not present

## 2022-06-12 DIAGNOSIS — S32502A Unspecified fracture of left pubis, initial encounter for closed fracture: Secondary | ICD-10-CM | POA: Diagnosis not present

## 2022-06-12 DIAGNOSIS — I4821 Permanent atrial fibrillation: Secondary | ICD-10-CM | POA: Diagnosis not present

## 2022-06-12 LAB — CBC
HCT: 32.5 % — ABNORMAL LOW (ref 39.0–52.0)
Hemoglobin: 10.3 g/dL — ABNORMAL LOW (ref 13.0–17.0)
MCH: 32 pg (ref 26.0–34.0)
MCHC: 31.7 g/dL (ref 30.0–36.0)
MCV: 100.9 fL — ABNORMAL HIGH (ref 80.0–100.0)
Platelets: 54 10*3/uL — ABNORMAL LOW (ref 150–400)
RBC: 3.22 MIL/uL — ABNORMAL LOW (ref 4.22–5.81)
RDW: 16.4 % — ABNORMAL HIGH (ref 11.5–15.5)
WBC: 5.3 10*3/uL (ref 4.0–10.5)
nRBC: 0 % (ref 0.0–0.2)

## 2022-06-12 LAB — BASIC METABOLIC PANEL
Anion gap: 5 (ref 5–15)
BUN: 24 mg/dL — ABNORMAL HIGH (ref 8–23)
CO2: 27 mmol/L (ref 22–32)
Calcium: 8 mg/dL — ABNORMAL LOW (ref 8.9–10.3)
Chloride: 108 mmol/L (ref 98–111)
Creatinine, Ser: 1.06 mg/dL (ref 0.61–1.24)
GFR, Estimated: >60 mL/min (ref >60)
Glucose, Bld: 136 mg/dL — ABNORMAL HIGH (ref 70–99)
Potassium: 3.5 mmol/L (ref 3.5–5.1)
Sodium: 140 mmol/L (ref 135–145)

## 2022-06-12 MED ORDER — BISACODYL 5 MG PO TBEC: 10.0000 mg | DELAYED_RELEASE_TABLET | ORAL | Status: AC

## 2022-06-12 NOTE — Plan of Care (Addendum)
  Problem: Education: Goal: Knowledge of condition and prescribed therapy will improve Outcome: Progressing   Problem: Cardiac: Goal: Will achieve and/or maintain adequate cardiac output Outcome: Progressing   Problem: Physical Regulation: Goal: Complications related to the disease process, condition or treatment will be avoided or minimized Outcome: Progressing   Problem: Education: Goal: Knowledge of General Education information will improve Description: Including pain rating scale, medication(s)/side effects and non-pharmacologic comfort measures Outcome: Progressing   Problem: Health Behavior/Discharge Planning: Goal: Ability to manage health-related needs will improve Outcome: Progressing   Problem: Clinical Measurements: Goal: Ability to maintain clinical measurements within normal limits will improve Outcome: Progressing Goal: Will remain free from infection Outcome: Progressing Goal: Diagnostic test results will improve Outcome: Progressing Goal: Cardiovascular complication will be avoided Outcome: Progressing   Problem: Activity: Goal: Risk for activity intolerance will decrease Outcome: Progressing   Problem: Nutrition: Goal: Adequate nutrition will be maintained Outcome: Progressing   Problem: Elimination: Goal: Will not experience complications related to bowel motility Outcome: Progressing Goal: Will not experience complications related to urinary retention Outcome: Progressing   Problem: Pain Managment: Goal: General experience of comfort will improve Outcome: Progressing   Problem: Safety: Goal: Ability to remain free from injury will improve Outcome: Progressing   Problem: Skin Integrity: Goal: Risk for impaired skin integrity will decrease Outcome: Progressing

## 2022-06-12 NOTE — Assessment & Plan Note (Signed)
Permanent A-fib.  Continue Eliquis for anticoagulation

## 2022-06-12 NOTE — Progress Notes (Signed)
Physical Therapy Treatment Patient Details Name: Jack [name] MRN: [id number] DOB: [birth year] Today's Date: 06/12/2022   History of Present Illness Jack [name] is a 88 y.o. male  here with fall. Pt has h/o HTN, CKD, Afib on eliquis, and reportedly was walking to bathroom when he slipped. He landed onto his left hip and has since had left pelvic pain and some head pain at a site where he struck the ground. He is on anticoagulation. No LOC. Reports he was able to walk with minimal pain. No fever, chills. No cough, SOB. No other complaints. No weakness, numbness. Per orthopedics, 25-50% WB'ing on LLE allowed.    PT Comments    Pt is progressing well demonstrating ability to maintain 25-50% WB'ing with transfers and gait at supervision to min guard level.  Pt progressed gait distance to 24' today but continues to be limited to pain in LLE with movement.  Current D/C recommendation is appropriate.   Recommendations for follow up therapy are one component of a multi-disciplinary discharge planning process, led by the attending physician.  Recommendations may be updated based on patient status, additional functional criteria and insurance authorization.  Follow Up Recommendations  Skilled nursing-short term rehab (<3 hours/day) Can patient physically be transported by private vehicle: No   Assistance Recommended at Discharge Intermittent Supervision/Assistance  Patient can return home with the following A little help with walking and/or transfers;A little help with bathing/dressing/bathroom;Assistance with cooking/housework;Assist for transportation;Help with stairs or ramp for entrance   Equipment Recommendations  Rolling walker (2 wheels)    Recommendations for Other Services       Precautions / Restrictions Precautions Precautions: Fall Restrictions Weight Bearing Restrictions: Yes LLE Weight Bearing: Touchdown weight bearing LLE Partial Weight Bearing Percentage or Pounds: 25-50%      Mobility  Bed Mobility Overal bed mobility: Needs Assistance Bed Mobility: Supine to Sit     Supine to sit: HOB elevated, Supervision     General bed mobility comments: increased time; pt did not require cues.    Transfers Overall transfer level: Needs assistance Equipment used: Rolling walker (2 wheels) Transfers: Sit to/from Stand Sit to Stand: Supervision   Step pivot transfers: From elevated surface, Supervision       General transfer comment: Reviewed WB status; pt able to maintain.    Ambulation/Gait Ambulation/Gait assistance: Min guard Gait Distance (Feet): 24 Feet Assistive device: Rolling walker (2 wheels) Gait Pattern/deviations: Step-to pattern, Decreased step length - right, Leaning posteriorly Gait velocity: decreased     General Gait Details: Pt able to maintain WB status.   Stairs             Wheelchair Mobility    Modified Rankin (Stroke Patients Only)       Balance Overall balance assessment: Modified Independent Sitting-balance support: Bilateral upper extremity supported, Feet supported Sitting balance-Leahy Scale: Good     Standing balance support: Bilateral upper extremity supported, Reliant on assistive device for balance Standing balance-Leahy Scale: Poor Standing balance comment: Relies heavily on UE support on RW due to LLE WB precautions. Steady however in standing                            Cognition Arousal/Alertness: Awake/alert Behavior During Therapy: WFL for tasks assessed/performed Overall Cognitive Status: Within Functional Limits for tasks assessed  Exercises Total Joint Exercises Ankle Circles/Pumps: AROM, Both, 10 reps, Strengthening Quad Sets: AROM, Strengthening, Both, 10 reps Other Exercises Other Exercises: Gave pt handout for LE strengthening exercises.    General Comments        Pertinent Vitals/Pain Pain  Assessment Pain Assessment: 0-10 Pain Score: 10-Worst pain ever (3-10/10 depending on movment) Faces Pain Scale: Hurts whole lot Pain Location: LLE Pain Descriptors / Indicators: Grimacing, Discomfort Pain Intervention(s): Limited activity within patient's tolerance    Home Living                          Prior Function            PT Goals (current goals can now be found in the care plan section) Acute Rehab PT Goals Patient Stated Goal: improve mobility PT Goal Formulation: With patient Time For Goal Achievement: 06/25/22 Potential to Achieve Goals: Good Progress towards PT goals: Progressing toward goals    Frequency    7X/week      PT Plan      Co-evaluation              AM-PAC PT "6 Clicks" Mobility   Outcome Measure  Help needed turning from your back to your side while in a flat bed without using bedrails?: None Help needed moving from lying on your back to sitting on the side of a flat bed without using bedrails?: A Little Help needed moving to and from a bed to a chair (including a wheelchair)?: A Little Help needed standing up from a chair using your arms (e.g., wheelchair or bedside chair)?: A Little Help needed to walk in hospital room?: A Little Help needed climbing 3-5 steps with a railing? : A Lot 6 Click Score: 18    End of Session Equipment Utilized During Treatment: Gait belt Activity Tolerance: Patient tolerated treatment well Patient left: in chair;with call bell/phone within reach Nurse Communication: Mobility status PT Visit Diagnosis: Unsteadiness on feet (R26.81);History of falling (Z91.81);Other abnormalities of gait and mobility (R26.89);Muscle weakness (generalized) (M62.81);Pain Pain - Right/Left: Left Pain - part of body: Leg     Time: 1751-0258 PT Time Calculation (min) (ACUTE ONLY): 25 min  Charges:  $Gait Training: 8-22 mins $Therapeutic Exercise: 8-22 mins                    Bjorn Loser, PTA  06/12/22,  1:36 PM

## 2022-06-12 NOTE — Assessment & Plan Note (Signed)
Continue Senokot S2 tablets twice a day, continue MiraLAX twice daily, adding Dulcolax

## 2022-06-12 NOTE — Assessment & Plan Note (Signed)
Positive orthostatics on admission.  Holding Flomax and antihypertensive at that could be the etiology continue IV hydration and recheck orthostatics

## 2022-06-12 NOTE — Assessment & Plan Note (Signed)
Chronic issues and is managed by her outpatient providers.  Continue pain medicines

## 2022-06-12 NOTE — Assessment & Plan Note (Signed)
Follows with oncology.  Continue treatment as an outpatient

## 2022-06-12 NOTE — Progress Notes (Signed)
  Progress Note   Patient: Jack [name] YBR:[id number] DOB: 1933/08/29 DOA: 06/10/2022     2 DOS: the patient was seen and examined on 06/12/2022   Brief hospital course:  88 y.o. male with medical history significant of  hypertension, CKD stage IIIb, A. fib on Eliquis, GERD,BPH, HLD, Multiple myeloma  , chronic compression fracture with chronic back pain due to multiple myeloma admitted for pelvic fracture status post fall  9/29: PT, OT eval recommends SNF.  Ortho recommends medical management 9/30: Feeling constipated and hoping bowel regimen helps   Assessment and Plan: * Pelvic fracture ([place]) Seen by Ortho.  He has nondisplaced left superior pubic ramus fracture extending into the acetabulum and nondisplaced inferior pubic ramus fracture.  Ortho recommends conservative management with pain control and starting physical therapy.  Partial protected weightbearing status and outpatient Ortho follow-up in 2 weeks  PT and OT recommends SNF.  TOC working on placement  Postural dizziness with presyncope Positive orthostatics on admission.  Holding Flomax and antihypertensive at that could be the etiology continue IV hydration and recheck orthostatics  Multiple myeloma ([place]) Follows with oncology.  Continue treatment as an outpatient  Multiple pathological fractures Chronic issues and is managed by her outpatient providers.  Continue pain medicines  Atrial fibrillation (HCC) Permanent A-fib.  Continue Eliquis for anticoagulation  Constipation Continue Senokot S2 tablets twice a day, continue MiraLAX twice daily, adding Dulcolax  CKD (chronic kidney disease), stage IV (HCC)-resolved as of 06/11/2022 Stable and at baseline        Subjective: Feeling constipated and requesting bowel regimen.  He is feeling that he is going to pass gas and hopefully bowel movement soon  Physical Exam: Vitals:   06/11/22 0750 06/11/22 1531 06/11/22 2351 06/12/22 0820  BP: (!) 166/87 138/75 (!)  148/71 (!) 153/84  Pulse: (!) 55 (!) 58 (!) 53 (!) 56  Resp:   18 16  Temp: 98.4 F (36.9 C) 97.8 F (36.6 C) 97.9 F (36.6 C) 98.3 F (36.8 C)  TempSrc:      SpO2: 96% 94% 94% 95%  Weight:      Height:       88 year old male sitting in the chair comfortably without any acute distress Lungs clear to auscultation bilaterally Cardiovascular irregularly irregular heart rate Abdomen soft, benign Neuro alert and awake, nonfocal Extremities: Tenderness around the left hip/upper thigh Skin no rash or lesion Psych normal mood and affect Data Reviewed:  Hemoglobin 10.3  Family Communication: Wife updated at bedside  Disposition: Status is: Inpatient Remains inpatient appropriate because: Waiting for SNF   Planned Discharge Destination: Skilled nursing facility    DVT prophylaxis-Eliquis Time spent: 35 minutes  Author: Max Sane, MD 06/12/2022 2:25 PM  For on call review [email or web address].

## 2022-06-12 NOTE — Assessment & Plan Note (Signed)
Seen by Ortho.  He has nondisplaced left superior pubic ramus fracture extending into the acetabulum and nondisplaced inferior pubic ramus fracture.  Ortho recommends conservative management with pain control and starting physical therapy.  Partial protected weightbearing status and outpatient Ortho follow-up in 2 weeks  PT and OT recommends SNF.  TOC working on placement

## 2022-06-13 DIAGNOSIS — S32502A Unspecified fracture of left pubis, initial encounter for closed fracture: Secondary | ICD-10-CM | POA: Diagnosis not present

## 2022-06-13 DIAGNOSIS — K5900 Constipation, unspecified: Secondary | ICD-10-CM | POA: Diagnosis not present

## 2022-06-13 DIAGNOSIS — I4821 Permanent atrial fibrillation: Secondary | ICD-10-CM | POA: Diagnosis not present

## 2022-06-13 DIAGNOSIS — C9001 Multiple myeloma in remission: Secondary | ICD-10-CM | POA: Diagnosis not present

## 2022-06-13 LAB — CBC
HCT: 32.4 % — ABNORMAL LOW (ref 39.0–52.0)
Hemoglobin: 10.2 g/dL — ABNORMAL LOW (ref 13.0–17.0)
MCH: 31.5 pg (ref 26.0–34.0)
MCHC: 31.5 g/dL (ref 30.0–36.0)
MCV: 100 fL (ref 80.0–100.0)
Platelets: 54 10*3/uL — ABNORMAL LOW (ref 150–400)
RBC: 3.24 MIL/uL — ABNORMAL LOW (ref 4.22–5.81)
RDW: 16.1 % — ABNORMAL HIGH (ref 11.5–15.5)
WBC: 5.6 10*3/uL (ref 4.0–10.5)
nRBC: 0 % (ref 0.0–0.2)

## 2022-06-13 LAB — GLUCOSE, CAPILLARY: Glucose-Capillary: 104 mg/dL — ABNORMAL HIGH (ref 70–99)

## 2022-06-13 MED ORDER — POLYETHYLENE GLYCOL 3350 17 G PO PACK
17.0000 g | PACK | Freq: Every day | ORAL | Status: DC
  Administered 2022-06-14: 17 g via ORAL
  Filled 2022-06-13 (×2): qty 1

## 2022-06-13 MED ORDER — CARVEDILOL 25 MG PO TABS
25.0000 mg | ORAL_TABLET | Freq: Every day | ORAL | Status: DC
  Administered 2022-06-15: 25 mg via ORAL
  Filled 2022-06-13 (×2): qty 1

## 2022-06-13 NOTE — Assessment & Plan Note (Signed)
Follows with oncology.  Continue treatment as an outpatient

## 2022-06-13 NOTE — Plan of Care (Signed)
  Problem: Education: Goal: Knowledge of condition and prescribed therapy will improve Outcome: Progressing   Problem: Cardiac: Goal: Will achieve and/or maintain adequate cardiac output Outcome: Progressing   Problem: Physical Regulation: Goal: Complications related to the disease process, condition or treatment will be avoided or minimized Outcome: Progressing   Problem: Education: Goal: Knowledge of General Education information will improve Description: Including pain rating scale, medication(s)/side effects and non-pharmacologic comfort measures Outcome: Progressing   Problem: Health Behavior/Discharge Planning: Goal: Ability to manage health-related needs will improve Outcome: Progressing   Problem: Clinical Measurements: Goal: Ability to maintain clinical measurements within normal limits will improve Outcome: Progressing Goal: Will remain free from infection Outcome: Progressing Goal: Diagnostic test results will improve Outcome: Progressing Goal: Cardiovascular complication will be avoided Outcome: Progressing   Problem: Activity: Goal: Risk for activity intolerance will decrease Outcome: Progressing   Problem: Nutrition: Goal: Adequate nutrition will be maintained Outcome: Progressing   Problem: Elimination: Goal: Will not experience complications related to bowel motility Outcome: Progressing Goal: Will not experience complications related to urinary retention Outcome: Progressing   Problem: Pain Managment: Goal: General experience of comfort will improve Outcome: Progressing   Problem: Safety: Goal: Ability to remain free from injury will improve Outcome: Progressing   Problem: Skin Integrity: Goal: Risk for impaired skin integrity will decrease Outcome: Progressing

## 2022-06-13 NOTE — Assessment & Plan Note (Signed)
Now resolved.  We will stop Dulcolax and cut back on the dose of MiraLAX

## 2022-06-13 NOTE — Assessment & Plan Note (Signed)
Chronic issues and is managed by her outpatient providers.  Continue pain medicines.

## 2022-06-13 NOTE — Assessment & Plan Note (Signed)
Seen by Ortho.  He has nondisplaced left superior pubic ramus fracture extending into the acetabulum and nondisplaced inferior pubic ramus fracture.  Ortho recommends conservative management with pain control and starting physical therapy.  Partial protected weightbearing status and outpatient Ortho follow-up in 2 weeks  PT and OT recommends SNF.  TOC working on placement.  Family first preference is [facility]

## 2022-06-13 NOTE — TOC Progression Note (Signed)
Transition of Care [facility]) - Progression Note    Patient Details  Name: TAYVON CULLEY MRN: [id number] Date of Birth: 06-Apr-1933  [facility]) CM/SW Contact  Izola Price, RN Phone Number: 06/13/2022, 3:16 PM  Clinical Narrative:  10/1: Spoke with spouse regarding bed offers so far as of 315 pm today. She stated [facility] was absolute first preference, which is still pending, and she wanted to wait till Monday to see if they would accept or TOC could reach out. They would accept [facility], which has accepted, if [facility] did not work out. [facility] is right near their home and they have had friends go there. TOC will reach out to patient/spouse Monday. Simmie Davies RN CM      Expected Discharge Plan: Home/Self Care Barriers to Discharge: Continued Medical Work up  Expected Discharge Plan and Services Expected Discharge Plan: Home/Self Care   Discharge Planning Services: CM Consult   Living arrangements for the past 2 months: Single Family Home                 DME Arranged: N/A         HH Arranged: NA           Social Determinants of Health (SDOH) Interventions    Readmission Risk Interventions    03/27/2021    1:02 PM  Readmission Risk Prevention Plan  Transportation Screening Complete  Medication Review [employer]) Complete  PCP or Specialist appointment within 3-5 days of discharge Complete  HRI or Minor Complete  SW Recovery Care/Counseling Consult Complete  [place] Patient Refused

## 2022-06-13 NOTE — Assessment & Plan Note (Signed)
Positive orthostatics on admission.  Stop Flomax.  Spouse in agreement

## 2022-06-13 NOTE — Progress Notes (Signed)
Physical Therapy Treatment Patient Details Name: Jack [name] MRN: [id number] DOB: [birth year] Today's Date: 06/13/2022   History of Present Illness Jack [name] is a 88 y.o. male  here with fall. Pt has h/o HTN, CKD, Afib on eliquis, and reportedly was walking to bathroom when he slipped. He landed onto his left hip and has since had left pelvic pain and some head pain at a site where he struck the ground. He is on anticoagulation. No LOC. Reports he was able to walk with minimal pain. No fever, chills. No cough, SOB. No other complaints. No weakness, numbness. Per orthopedics, 25-50% WB'ing on LLE allowed.    PT Comments    Pt in bed, ready to use BSC.  To EOB with supervision and increased time due to pain.  He has poor transfer quality and safety to [facility].  Voids but no BM.  He is able to walk 10' in room with RW and heavy UE support with slow step to gait ttwb Left.  Pt stated he felt line 24' gait yesterday was an overestimation of gait distance.  Struggled with 10' today from bed to wall and back to chair.  Remained up in recliner with needs met.   Recommendations for follow up therapy are one component of a multi-disciplinary discharge planning process, led by the attending physician.  Recommendations may be updated based on patient status, additional functional criteria and insurance authorization.  Follow Up Recommendations  Skilled nursing-short term rehab (<3 hours/day) Can patient physically be transported by private vehicle: No   Assistance Recommended at Discharge Intermittent Supervision/Assistance  Patient can return home with the following A little help with walking and/or transfers;A little help with bathing/dressing/bathroom;Assistance with cooking/housework;Assist for transportation;Help with stairs or ramp for entrance   Equipment Recommendations  Rolling walker (2 wheels)    Recommendations for Other Services       Precautions / Restrictions  Precautions Precautions: Fall Restrictions Weight Bearing Restrictions: Yes LLE Weight Bearing: Touchdown weight bearing LLE Partial Weight Bearing Percentage or Pounds: 25-50%     Mobility  Bed Mobility Overal bed mobility: Needs Assistance Bed Mobility: Supine to Sit     Supine to sit: HOB elevated, Supervision     General bed mobility comments: increased time; pt did not require cues.    Transfers Overall transfer level: Needs assistance Equipment used: Rolling walker (2 wheels), None Transfers: Sit to/from Stand, Bed to chair/wheelchair/BSC Sit to Stand: Min guard     Squat pivot transfers: Min assist     General transfer comment: poor safety with transfer to commode  - holds commode to stand and uses one hand on armrest and one on walker despite cues    Ambulation/Gait Ambulation/Gait assistance: Min guard Gait Distance (Feet): 10 Feet Assistive device: Rolling walker (2 wheels) Gait Pattern/deviations: Step-to pattern, Decreased step length - right, Leaning posteriorly Gait velocity: decreased     General Gait Details: Pt able to maintain WB status.   Stairs             Wheelchair Mobility    Modified Rankin (Stroke Patients Only)       Balance Overall balance assessment: Modified Independent Sitting-balance support: Bilateral upper extremity supported, Feet supported Sitting balance-Leahy Scale: Good     Standing balance support: Bilateral upper extremity supported, Reliant on assistive device for balance Standing balance-Leahy Scale: Poor Standing balance comment: Relies heavily on UE support on RW due to LLE WB precautions. Steady however in standing  Cognition Arousal/Alertness: Awake/alert Behavior During Therapy: WFL for tasks assessed/performed Overall Cognitive Status: Within Functional Limits for tasks assessed                                          Exercises Other  Exercises Other Exercises: to [facility] for BM attempt but only voids    General Comments        Pertinent Vitals/Pain Pain Assessment Pain Assessment: Faces Faces Pain Scale: Hurts whole lot Pain Location: LLE Pain Descriptors / Indicators: Grimacing, Discomfort Pain Intervention(s): Limited activity within patient's tolerance, Monitored during session, Repositioned    Home Living                          Prior Function            PT Goals (current goals can now be found in the care plan section) Progress towards PT goals: Progressing toward goals    Frequency    7X/week      PT Plan      Co-evaluation              AM-PAC PT "6 Clicks" Mobility   Outcome Measure  Help needed turning from your back to your side while in a flat bed without using bedrails?: None Help needed moving from lying on your back to sitting on the side of a flat bed without using bedrails?: A Little Help needed moving to and from a bed to a chair (including a wheelchair)?: A Little Help needed standing up from a chair using your arms (e.g., wheelchair or bedside chair)?: A Little Help needed to walk in hospital room?: A Little Help needed climbing 3-5 steps with a railing? : A Lot 6 Click Score: 18    End of Session Equipment Utilized During Treatment: Gait belt Activity Tolerance: Patient tolerated treatment well Patient left: in chair;with call bell/phone within reach Nurse Communication: Mobility status PT Visit Diagnosis: Unsteadiness on feet (R26.81);History of falling (Z91.81);Other abnormalities of gait and mobility (R26.89);Muscle weakness (generalized) (M62.81);Pain Pain - Right/Left: Left Pain - part of body: Leg     Time: 1962-2297 PT Time Calculation (min) (ACUTE ONLY): 23 min  Charges:  $Gait Training: 8-22 mins $Therapeutic Activity: 8-22 mins                   Chesley Noon, PTA 06/13/22, 9:35 AM

## 2022-06-13 NOTE — Progress Notes (Signed)
  Progress Note   Patient: Jack [name] VPX:[id number] DOB: 08-25-1933 DOA: 06/10/2022     3 DOS: the patient was seen and examined on 06/13/2022   Brief hospital course:  88 y.o. male with medical history significant of  hypertension, CKD stage IIIb, A. fib on Eliquis, GERD,BPH, HLD, Multiple myeloma  , chronic compression fracture with chronic back pain due to multiple myeloma admitted for pelvic fracture status post fall  9/29: PT, OT eval recommends SNF.  Ortho recommends medical management 9/30: Feeling constipated and hoping bowel regimen helps 10/1: Had good bowel movement.  Feeling better   Assessment and Plan: * Pelvic fracture ([place]) Seen by Ortho.  He has nondisplaced left superior pubic ramus fracture extending into the acetabulum and nondisplaced inferior pubic ramus fracture.  Ortho recommends conservative management with pain control and starting physical therapy.  Partial protected weightbearing status and outpatient Ortho follow-up in 2 weeks  PT and OT recommends SNF.  TOC working on placement.  Family first preference is [facility]  Postural dizziness with presyncope Positive orthostatics on admission.  Stop Flomax.  Spouse in agreement  Multiple myeloma ([place]) Follows with oncology.  Continue treatment as an outpatient  Multiple pathological fractures Chronic issues and is managed by her outpatient providers.  Continue pain medicines.  Atrial fibrillation (HCC) Permanent A-fib.  Continue Eliquis for anticoagulation.  Resume Coreg 25 mg once daily instead of twice daily  Constipation Now resolved.  We will stop Dulcolax and cut back on the dose of MiraLAX  CKD (chronic kidney disease), stage IV (HCC)-resolved as of 06/11/2022 Stable and at baseline        Subjective: Patient had few good bowel movement and is feeling much better  Physical Exam: Vitals:   06/13/22 0002 06/13/22 0857 06/13/22 1547 06/13/22 1942  BP: 137/77 (!) 159/96 (!) 153/82 127/75   Pulse: 66 79 67 67  Resp: $Remo'18 17 17 20  'kPDdC$ Temp: 98.6 F (37 C) 97.7 F (36.5 C) 98.8 F (37.1 C) 98.2 F (36.8 C)  TempSrc: Oral     SpO2: 94% 100% 97% 93%  Weight:      Height:       88 year old male sitting in the chair comfortably without any acute distress Lungs clear to auscultation bilaterally Cardiovascular irregularly irregular heart rate Abdomen soft, benign Neuro alert and awake, nonfocal Extremities:Tenderness around the left hip/upper thigh Skin no rash or lesion Psych normal mood and affect Data Reviewed:  Hemoglobin 10.2  Family Communication: Wife updated at bedside  Disposition: Status is: Inpatient Remains inpatient appropriate because: Pain control and placement   Planned Discharge Destination: Skilled nursing facility    DVT prophylaxis-Eliquis Time spent: 35 minutes  Author: Max Sane, MD 06/13/2022 9:49 PM  For on call review [email or web address].

## 2022-06-13 NOTE — Assessment & Plan Note (Signed)
Permanent A-fib.  Continue Eliquis for anticoagulation.  Resume Coreg 25 mg once daily instead of twice daily

## 2022-06-14 DIAGNOSIS — I4821 Permanent atrial fibrillation: Secondary | ICD-10-CM | POA: Diagnosis not present

## 2022-06-14 DIAGNOSIS — K5900 Constipation, unspecified: Secondary | ICD-10-CM | POA: Diagnosis not present

## 2022-06-14 DIAGNOSIS — N184 Chronic kidney disease, stage 4 (severe): Secondary | ICD-10-CM

## 2022-06-14 DIAGNOSIS — S32502A Unspecified fracture of left pubis, initial encounter for closed fracture: Secondary | ICD-10-CM | POA: Diagnosis not present

## 2022-06-14 LAB — GLUCOSE, CAPILLARY: Glucose-Capillary: 128 mg/dL — ABNORMAL HIGH (ref 70–99)

## 2022-06-14 MED ORDER — ENSURE ENLIVE PO LIQD: 237.0000 mL | Freq: Every evening | ORAL | Status: DC

## 2022-06-14 NOTE — Care Management Important Message (Signed)
Important Message  Patient Details  Name: Jack [name] MRN: [id number] Date of Birth: 1933/08/21   Medicare Important Message Given:  Yes     [name] 06/14/2022, 12:43 PM

## 2022-06-14 NOTE — Assessment & Plan Note (Signed)
Permanent A-fib.  Continue Eliquis for anticoagulation.  Continue Coreg 25 mg once daily

## 2022-06-14 NOTE — Plan of Care (Signed)
  Problem: Education: Goal: Knowledge of condition and prescribed therapy will improve Outcome: Progressing   Problem: Cardiac: Goal: Will achieve and/or maintain adequate cardiac output Outcome: Progressing   Problem: Physical Regulation: Goal: Complications related to the disease process, condition or treatment will be avoided or minimized Outcome: Progressing   Problem: Education: Goal: Knowledge of General Education information will improve Description: Including pain rating scale, medication(s)/side effects and non-pharmacologic comfort measures Outcome: Progressing   Problem: Health Behavior/Discharge Planning: Goal: Ability to manage health-related needs will improve Outcome: Progressing   Problem: Clinical Measurements: Goal: Ability to maintain clinical measurements within normal limits will improve Outcome: Progressing Goal: Will remain free from infection Outcome: Progressing Goal: Diagnostic test results will improve Outcome: Progressing Goal: Cardiovascular complication will be avoided Outcome: Progressing   Problem: Activity: Goal: Risk for activity intolerance will decrease Outcome: Progressing   Problem: Nutrition: Goal: Adequate nutrition will be maintained Outcome: Progressing   Problem: Elimination: Goal: Will not experience complications related to bowel motility Outcome: Progressing Goal: Will not experience complications related to urinary retention Outcome: Progressing   Problem: Pain Managment: Goal: General experience of comfort will improve Outcome: Progressing   Problem: Safety: Goal: Ability to remain free from injury will improve Outcome: Progressing   Problem: Skin Integrity: Goal: Risk for impaired skin integrity will decrease Outcome: Progressing

## 2022-06-14 NOTE — Progress Notes (Addendum)
Physical Therapy Treatment Patient Details Name: Jack [name] MRN: [id number] DOB: 1933/09/09 Today's Date: 06/14/2022   History of Present Illness Jack [name] is a 88 y.o. male  here with fall. Pt has h/o HTN, CKD, Afib on eliquis, and reportedly was walking to bathroom when he slipped. He landed onto his left hip and has since had left pelvic pain and some head pain at a site where he struck the ground. He is on anticoagulation. No LOC. Reports he was able to walk with minimal pain. No fever, chills. No cough, SOB. No other complaints. No weakness, numbness. Per orthopedics, 25-50% WB'ing on LLE allowed.    PT Comments    Pt in bed, needing to use BSC.  Is able to get to EOB with rail but no assist today and less pain.  Steady in sitting and stands fully to walker with min a x 1 and appropriate use of walker to turn to commode with light min a x 1.  He is able to have large soft BM and RN aware.  Attends to his own care needs.  Is able  to walk a small lap in room with heavy use of RW but overall appears to have a more comfortable gait today.  Remains up in chair.  HR did increase briefly to 129 after gait but steadily returned to baseline.  Sats stable. Stated he did HEP and more movement in bed last night on his own that he feels helps pain/mobility today  encouraged to continue.   Recommendations for follow up therapy are one component of a multi-disciplinary discharge planning process, led by the attending physician.  Recommendations may be updated based on patient status, additional functional criteria and insurance authorization.  Follow Up Recommendations  Skilled nursing-short term rehab (<3 hours/day)     Assistance Recommended at Discharge Intermittent Supervision/Assistance  Patient can return home with the following A little help with walking and/or transfers;A little help with bathing/dressing/bathroom;Assistance with cooking/housework;Assist for transportation;Help with  stairs or ramp for entrance   Equipment Recommendations  Rolling walker (2 wheels)    Recommendations for Other Services       Precautions / Restrictions Precautions Precautions: Fall Restrictions Weight Bearing Restrictions: Yes LLE Weight Bearing: Touchdown weight bearing LLE Partial Weight Bearing Percentage or Pounds: 25-50%     Mobility  Bed Mobility Overal bed mobility: Needs Assistance Bed Mobility: Supine to Sit     Supine to sit: HOB elevated, Supervision     General bed mobility comments: increased time; pt did not require cues.    Transfers Overall transfer level: Needs assistance Equipment used: Rolling walker (2 wheels), None Transfers: Sit to/from Stand, Bed to chair/wheelchair/BSC Sit to Stand: Min guard   Step pivot transfers: Min assist       General transfer comment: overall improved today.  stood fully to walker with appropraite turn and hand placements today    Ambulation/Gait Ambulation/Gait assistance: Min guard   Assistive device: Rolling walker (2 wheels) Gait Pattern/deviations: Step-to pattern, Decreased step length - right, Leaning posteriorly Gait velocity: decreased     General Gait Details: Pt able to maintain WB status. smoother gait with less pain today   Stairs             Wheelchair Mobility    Modified Rankin (Stroke Patients Only)       Balance Overall balance assessment: Needs assistance Sitting-balance support: Bilateral upper extremity supported, Feet supported Sitting balance-Leahy Scale: Good     Standing  balance support: Bilateral upper extremity supported, Reliant on assistive device for balance Standing balance-Leahy Scale: Fair Standing balance comment: Relies heavily on UE support on RW due to LLE WB precautions. Steady however in standing                            Cognition Arousal/Alertness: Awake/alert Behavior During Therapy: WFL for tasks assessed/performed Overall  Cognitive Status: Within Functional Limits for tasks assessed                                          Exercises Other Exercises Other Exercises: to commode large soft BM - RN aware    General Comments        Pertinent Vitals/Pain Pain Assessment Pain Assessment: Faces Faces Pain Scale: Hurts even more Pain Location: LLE Pain Descriptors / Indicators: Grimacing, Discomfort Pain Intervention(s): Limited activity within patient's tolerance, Monitored during session, Repositioned    Home Living                          Prior Function            PT Goals (current goals can now be found in the care plan section) Progress towards PT goals: Progressing toward goals    Frequency    7X/week      PT Plan Current plan remains appropriate    Co-evaluation              AM-PAC PT "6 Clicks" Mobility   Outcome Measure  Help needed turning from your back to your side while in a flat bed without using bedrails?: None Help needed moving from lying on your back to sitting on the side of a flat bed without using bedrails?: A Little Help needed moving to and from a bed to a chair (including a wheelchair)?: A Little Help needed standing up from a chair using your arms (e.g., wheelchair or bedside chair)?: A Little Help needed to walk in hospital room?: A Little Help needed climbing 3-5 steps with a railing? : A Lot 6 Click Score: 18    End of Session Equipment Utilized During Treatment: Gait belt Activity Tolerance: Patient tolerated treatment well Patient left: in chair;with call bell/phone within reach;with chair alarm set Nurse Communication: Mobility status;Other (comment) PT Visit Diagnosis: Unsteadiness on feet (R26.81);History of falling (Z91.81);Other abnormalities of gait and mobility (R26.89);Muscle weakness (generalized) (M62.81);Pain Pain - Right/Left: Left Pain - part of body: Leg     Time: 6144-3154 PT Time Calculation (min)  (ACUTE ONLY): 16 min  Charges:  $Gait Training: 8-22 mins                   Chesley Noon, PTA 06/14/22, 9:59 AM

## 2022-06-14 NOTE — Assessment & Plan Note (Signed)
Positive orthostatics on admission.  Stop Flomax.  Spouse in agreement

## 2022-06-14 NOTE — Assessment & Plan Note (Signed)
Seen by Ortho.  He has nondisplaced left superior pubic ramus fracture extending into the acetabulum and nondisplaced inferior pubic ramus fracture.  Ortho recommends conservative management with pain control and starting physical therapy.  Partial protected weightbearing status and outpatient Ortho follow-up in 2 weeks  PT and OT recommends SNF.  TOC working on placement.  No beds available yet

## 2022-06-14 NOTE — Assessment & Plan Note (Signed)
Follows with oncology.  Continue treatment as an outpatient.

## 2022-06-14 NOTE — Assessment & Plan Note (Signed)
Chronic issues and is managed by her outpatient providers.  Continue pain medicines

## 2022-06-14 NOTE — Assessment & Plan Note (Signed)
Now resolved.  Continue Senokot and MiraLAX

## 2022-06-14 NOTE — Progress Notes (Signed)
  Progress Note   Patient: TARICK PARENTEAU YTW:[id number] DOB: 1933-06-30 DOA: 06/10/2022     4 DOS: the patient was seen and examined on 06/14/2022   Brief hospital course:  88 y.o. male with medical history significant of  hypertension, CKD stage IIIb, A. fib on Eliquis, GERD,BPH, HLD, Multiple myeloma  , chronic compression fracture with chronic back pain due to multiple myeloma admitted for pelvic fracture status post fall  9/29: PT, OT eval recommends SNF.  Ortho recommends medical management 9/30: Feeling constipated and hoping bowel regimen helps 10/1: Had good bowel movement.  Feeling better 10/2: SNF pending   Assessment and Plan: * Pelvic fracture ([place]) Seen by Ortho.  He has nondisplaced left superior pubic ramus fracture extending into the acetabulum and nondisplaced inferior pubic ramus fracture.  Ortho recommends conservative management with pain control and starting physical therapy.  Partial protected weightbearing status and outpatient Ortho follow-up in 2 weeks  PT and OT recommends SNF.  TOC working on placement.  No beds available yet  Postural dizziness with presyncope Positive orthostatics on admission.  Stop Flomax.  Spouse in agreement  Multiple myeloma ([place]) Follows with oncology.  Continue treatment as an outpatient.  Multiple pathological fractures Chronic issues and is managed by her outpatient providers.  Continue pain medicines  Atrial fibrillation (HCC) Permanent A-fib.  Continue Eliquis for anticoagulation.  Continue Coreg 25 mg once daily   Constipation Now resolved.  Continue Senokot and MiraLAX  CKD (chronic kidney disease), stage IV (HCC)-resolved as of 06/11/2022 Stable and at baseline        Subjective: Sitting in the chair comfortably without any new issues  Physical Exam: Vitals:   06/14/22 0034 06/14/22 0500 06/14/22 0718 06/14/22 1651  BP: 137/79 117/82 129/65 (!) 134/90  Pulse: 65 92 63 75  Resp: 16 16    Temp: 98.1 F (36.7  C) 98.2 F (36.8 C) 98.2 F (36.8 C) 98.2 F (36.8 C)  TempSrc:  Oral    SpO2: 94% 97% 93% 95%  Weight:  79.2 kg    Height:       88 year old male sitting in the chair comfortably without any acute distress Lungs clear to auscultation bilaterally Cardiovascular irregularly irregular heart rate Abdomen soft, benign Neuro alert and awake, nonfocal Extremities:Tenderness around the left hip/upper thigh Skin no rash or lesion Psych normal mood and affect Data Reviewed:  There are no new results to review at this time.  Family Communication: Wife updated at bedside  Disposition: Status is: Inpatient Remains inpatient appropriate because: Waiting for placement   Planned Discharge Destination: Skilled nursing facility    DVT prophylaxis-Eliquis Time spent: 35 minutes  Author: Max Sane, MD 06/14/2022 8:02 PM  For on call review [email or web address].

## 2022-06-14 NOTE — TOC Progression Note (Signed)
Transition of Care [facility]) - Progression Note    Patient Details  Name: MESSIAH AHR MRN: [id number] Date of Birth: 05/19/1933  Transition of [facility]) CM/SW [place], RN Phone Number: 06/14/2022, 11:08 AM  Clinical Narrative:    The patient has no bed offers at this time, [facility] does not have a bed and neither does [facility], resent out the bedsearch and expanded the search area   Expected Discharge Plan: Home/Self Care Barriers to Discharge: Continued Medical Work up  Expected Discharge Plan and Services Expected Discharge Plan: Home/Self Care   Discharge Planning Services: CM Consult   Living arrangements for the past 2 months: Single Family Home                 DME Arranged: N/A         HH Arranged: NA           Social Determinants of Health (SDOH) Interventions    Readmission Risk Interventions    03/27/2021    1:02 PM  Readmission Risk Prevention Plan  Transportation Screening Complete  Medication Review [employer] photographer) Complete  PCP or Specialist appointment within 3-5 days of discharge Complete  HRI or [place] Complete  SW Recovery Care/Counseling Consult Complete  Palliative Care Screening [place] Patient Refused

## 2022-06-14 NOTE — Progress Notes (Signed)
Occupational Therapy Treatment Patient Details Name: Jack [name] MRN: [id number] DOB: [birth year] Today's Date: 06/14/2022   History of present illness Jack [name] is a 88 y.o. male  here with fall. Pt has h/o HTN, CKD, Afib on eliquis, and reportedly was walking to bathroom when he slipped. He landed onto his left hip and has since had left pelvic pain and some head pain at a site where he struck the ground. He is on anticoagulation. No LOC. Reports he was able to walk with minimal pain. No fever, chills. No cough, SOB. No other complaints. No weakness, numbness. Per orthopedics, 25-50% WB'ing on LLE allowed.   OT comments  Upon entering the room, pt seated in recliner chair and agreeable to OT intervention. Pt refusing all functional transfers, ambulation, and sit <>stand even after maximal encouragement to participate. Pt agreeable to grooming tasks from chair level only during session. Multiple attempts provided for out of chair activity with pt's continued refusal. Chair alarm activated and all needs within reach upon exiting the room.    Recommendations for follow up therapy are one component of a multi-disciplinary discharge planning process, led by the attending physician.  Recommendations may be updated based on patient status, additional functional criteria and insurance authorization.    Follow Up Recommendations  Skilled nursing-short term rehab (<3 hours/day)    Assistance Recommended at Discharge Intermittent Supervision/Assistance  Patient can return home with the following  A little help with walking and/or transfers;A little help with bathing/dressing/bathroom;Help with stairs or ramp for entrance;Assist for transportation;Assistance with cooking/housework   Equipment Recommendations  BSC/3in1       Precautions / Restrictions Precautions Precautions: Fall Restrictions Weight Bearing Restrictions: Yes LLE Weight Bearing: Partial weight bearing LLE Partial Weight  Bearing Percentage or Pounds: 25-50%       Mobility Bed Mobility               General bed mobility comments: seated in recliner chair    Transfers                   General transfer comment: pt refused         ADL either performed or assessed with clinical judgement   ADL Overall ADL's : Needs assistance/impaired                                       General ADL Comments: set up A for grooming tasks while seated in recliner chair. Pt refused standing and functional transfers    Extremity/Trunk Assessment Upper Extremity Assessment Upper Extremity Assessment: Overall WFL for tasks assessed            Vision Patient Visual Report: No change from baseline            Cognition Arousal/Alertness: Awake/alert Behavior During Therapy: WFL for tasks assessed/performed Overall Cognitive Status: Within Functional Limits for tasks assessed                                                     Pertinent Vitals/ Pain       Pain Assessment Pain Assessment: 0-10 Pain Score: 6  Pain Location: LLE Pain Descriptors / Indicators: Grimacing, Discomfort Pain Intervention(s): Limited activity within patient's tolerance, Monitored during session,  Premedicated before session  Home Living Family/patient expects to be discharged to:: Private residence                                            Frequency  Min 2X/week        Progress Toward Goals  OT Goals(current goals can now be found in the care plan section)  Progress towards OT goals: Progressing toward goals  Acute Rehab OT Goals Patient Stated Goal: go to rehab OT Goal Formulation: With patient Time For Goal Achievement: 06/25/22 Potential to Achieve Goals: Good  Plan Discharge plan remains appropriate;Frequency remains appropriate       AM-PAC OT "6 Clicks" Daily Activity     Outcome Measure   Help from another person eating meals?:  None Help from another person taking care of personal grooming?: A Lot Help from another person toileting, which includes using toliet, bedpan, or urinal?: A Lot Help from another person bathing (including washing, rinsing, drying)?: A Lot Help from another person to put on and taking off regular upper body clothing?: A Little Help from another person to put on and taking off regular lower body clothing?: A Lot 6 Click Score: 15    End of Session Equipment Utilized During Treatment: Rolling walker (2 wheels)  OT Visit Diagnosis: Unsteadiness on feet (R26.81);History of falling (Z91.81);Pain;Muscle weakness (generalized) (M62.81) Pain - Right/Left: Left Pain - part of body: Hip   Activity Tolerance Patient limited by pain;Other (comment) (self limiting)   Patient Left in chair;with call bell/phone within reach;with chair alarm set   Nurse Communication Mobility status        Time: [phone] OT Time Calculation (min): 10 min  Charges: OT General Charges $OT Visit: 1 Visit OT Treatments $Self Care/Home Management : 8-22 mins  Darleen Crocker, MS, OTR/L , CBIS ascom [phone]  06/14/22, 10:21 AM

## 2022-06-15 DIAGNOSIS — S32502A Unspecified fracture of left pubis, initial encounter for closed fracture: Secondary | ICD-10-CM | POA: Diagnosis not present

## 2022-06-15 DIAGNOSIS — R2689 Other abnormalities of gait and mobility: Secondary | ICD-10-CM | POA: Diagnosis not present

## 2022-06-15 DIAGNOSIS — S32591D Other specified fracture of right pubis, subsequent encounter for fracture with routine healing: Secondary | ICD-10-CM | POA: Diagnosis not present

## 2022-06-15 DIAGNOSIS — R296 Repeated falls: Secondary | ICD-10-CM | POA: Diagnosis not present

## 2022-06-15 DIAGNOSIS — J9601 Acute respiratory failure with hypoxia: Secondary | ICD-10-CM | POA: Diagnosis not present

## 2022-06-15 DIAGNOSIS — I4821 Permanent atrial fibrillation: Secondary | ICD-10-CM | POA: Diagnosis not present

## 2022-06-15 DIAGNOSIS — R278 Other lack of coordination: Secondary | ICD-10-CM | POA: Diagnosis not present

## 2022-06-15 DIAGNOSIS — S51001A Unspecified open wound of right elbow, initial encounter: Secondary | ICD-10-CM | POA: Diagnosis not present

## 2022-06-15 DIAGNOSIS — E785 Hyperlipidemia, unspecified: Secondary | ICD-10-CM | POA: Diagnosis not present

## 2022-06-15 DIAGNOSIS — R531 Weakness: Secondary | ICD-10-CM | POA: Diagnosis not present

## 2022-06-15 DIAGNOSIS — S0100XA Unspecified open wound of scalp, initial encounter: Secondary | ICD-10-CM | POA: Diagnosis not present

## 2022-06-15 DIAGNOSIS — C9001 Multiple myeloma in remission: Secondary | ICD-10-CM | POA: Diagnosis not present

## 2022-06-15 DIAGNOSIS — C9000 Multiple myeloma not having achieved remission: Secondary | ICD-10-CM | POA: Diagnosis not present

## 2022-06-15 DIAGNOSIS — S32511A Fracture of superior rim of right pubis, initial encounter for closed fracture: Secondary | ICD-10-CM | POA: Diagnosis not present

## 2022-06-15 DIAGNOSIS — M6281 Muscle weakness (generalized): Secondary | ICD-10-CM | POA: Diagnosis not present

## 2022-06-15 DIAGNOSIS — I700 Atherosclerosis of aorta: Secondary | ICD-10-CM | POA: Diagnosis not present

## 2022-06-15 DIAGNOSIS — K5900 Constipation, unspecified: Secondary | ICD-10-CM | POA: Diagnosis not present

## 2022-06-15 DIAGNOSIS — S32592S Other specified fracture of left pubis, sequela: Secondary | ICD-10-CM | POA: Diagnosis not present

## 2022-06-15 DIAGNOSIS — N184 Chronic kidney disease, stage 4 (severe): Secondary | ICD-10-CM | POA: Diagnosis not present

## 2022-06-15 DIAGNOSIS — S61501A Unspecified open wound of right wrist, initial encounter: Secondary | ICD-10-CM | POA: Diagnosis not present

## 2022-06-15 DIAGNOSIS — F4320 Adjustment disorder, unspecified: Secondary | ICD-10-CM | POA: Diagnosis not present

## 2022-06-15 DIAGNOSIS — N400 Enlarged prostate without lower urinary tract symptoms: Secondary | ICD-10-CM | POA: Diagnosis not present

## 2022-06-15 DIAGNOSIS — I5032 Chronic diastolic (congestive) heart failure: Secondary | ICD-10-CM | POA: Diagnosis not present

## 2022-06-15 DIAGNOSIS — S32592D Other specified fracture of left pubis, subsequent encounter for fracture with routine healing: Secondary | ICD-10-CM | POA: Diagnosis not present

## 2022-06-15 DIAGNOSIS — R55 Syncope and collapse: Secondary | ICD-10-CM | POA: Diagnosis not present

## 2022-06-15 DIAGNOSIS — K219 Gastro-esophageal reflux disease without esophagitis: Secondary | ICD-10-CM | POA: Diagnosis not present

## 2022-06-15 DIAGNOSIS — I480 Paroxysmal atrial fibrillation: Secondary | ICD-10-CM | POA: Diagnosis not present

## 2022-06-15 DIAGNOSIS — R42 Dizziness and giddiness: Secondary | ICD-10-CM | POA: Diagnosis not present

## 2022-06-15 DIAGNOSIS — S329XXD Fracture of unspecified parts of lumbosacral spine and pelvis, subsequent encounter for fracture with routine healing: Secondary | ICD-10-CM | POA: Diagnosis not present

## 2022-06-15 DIAGNOSIS — S81802A Unspecified open wound, left lower leg, initial encounter: Secondary | ICD-10-CM | POA: Diagnosis not present

## 2022-06-15 DIAGNOSIS — S51002A Unspecified open wound of left elbow, initial encounter: Secondary | ICD-10-CM | POA: Diagnosis not present

## 2022-06-15 DIAGNOSIS — M8440XD Pathological fracture, unspecified site, subsequent encounter for fracture with routine healing: Secondary | ICD-10-CM | POA: Diagnosis not present

## 2022-06-15 DIAGNOSIS — I10 Essential (primary) hypertension: Secondary | ICD-10-CM | POA: Diagnosis not present

## 2022-06-15 MED ORDER — MORPHINE SULFATE ER 30 MG PO TBCR: 30.0000 mg | EXTENDED_RELEASE_TABLET | Freq: Two times a day (BID) | ORAL | 0 refills | Status: AC

## 2022-06-15 MED ORDER — CARVEDILOL 25 MG PO TABS: 25.0000 mg | ORAL_TABLET | Freq: Every day | ORAL | 1 refills | Status: DC

## 2022-06-15 MED ORDER — MORPHINE SULFATE 15 MG PO TABS: 15.0000 mg | ORAL_TABLET | Freq: Four times a day (QID) | ORAL | 0 refills | Status: AC | PRN

## 2022-06-15 NOTE — Progress Notes (Signed)
Physical Therapy Treatment Patient Details Name: Jack [name] MRN: [id number] DOB: 10-Sep-1933 Today's Date: 06/15/2022   History of Present Illness Jack [name] is a 88 y.o. male  here with fall. Pt has h/o HTN, CKD, Afib on eliquis, and reportedly was walking to bathroom when he slipped. He landed onto his left hip and has since had left pelvic pain and some head pain at a site where he struck the ground. He is on anticoagulation. No LOC. Reports he was able to walk with minimal pain. No fever, chills. No cough, SOB. No other complaints. No weakness, numbness. Per orthopedics, 25-50% WB'ing on LLE allowed.    PT Comments    Pt ready for session in chair.  Continues with independent HEP in bed,  encouraged to continue on his own.  He is able to stand with min a x 1 and walk a small lap about 10' in room.  He was in a bit more pain today and when asked did not take pain meds this am.  Requested from nursing and educated on importance of pain meds to allow for increased mobility.  Voiced understanding.     Recommendations for follow up therapy are one component of a multi-disciplinary discharge planning process, led by the attending physician.  Recommendations may be updated based on patient status, additional functional criteria and insurance authorization.  Follow Up Recommendations  Skilled nursing-short term rehab (<3 hours/day)     Assistance Recommended at Discharge    Patient can return home with the following A little help with walking and/or transfers;A little help with bathing/dressing/bathroom;Assistance with cooking/housework;Assist for transportation;Help with stairs or ramp for entrance   Equipment Recommendations  Rolling walker (2 wheels)    Recommendations for Other Services       Precautions / Restrictions Precautions Precautions: Fall Restrictions Weight Bearing Restrictions: Yes LLE Weight Bearing: Partial weight bearing LLE Partial Weight Bearing Percentage  or Pounds: 25-50%     Mobility  Bed Mobility               General bed mobility comments: iin recliner before and after    Transfers Overall transfer level: Needs assistance Equipment used: Rolling walker (2 wheels) Transfers: Sit to/from Stand Sit to Stand: Min guard, Min assist                Ambulation/Gait Ambulation/Gait assistance: Min guard, Min assist Gait Distance (Feet): 10 Feet Assistive device: Rolling walker (2 wheels) Gait Pattern/deviations: Step-to pattern, Decreased step length - right, Leaning posteriorly Gait velocity: decreased     General Gait Details: some increased discomfort today due to no pain meds   Stairs             Wheelchair Mobility    Modified Rankin (Stroke Patients Only)       Balance Overall balance assessment: Needs assistance Sitting-balance support: Bilateral upper extremity supported, Feet supported Sitting balance-Leahy Scale: Good     Standing balance support: Bilateral upper extremity supported, Reliant on assistive device for balance Standing balance-Leahy Scale: Fair Standing balance comment: Relies heavily on UE support on RW due to LLE WB precautions. Steady however in standing                            Cognition Arousal/Alertness: Awake/alert Behavior During Therapy: WFL for tasks assessed/performed Overall Cognitive Status: Within Functional Limits for tasks assessed  Exercises      General Comments        Pertinent Vitals/Pain Pain Assessment Pain Assessment: Faces Pain Score: 6  Pain Location: LLE- did not take pain meds prior.  educated on importance of pain meds for mobility Pain Descriptors / Indicators: Grimacing, Discomfort Pain Intervention(s): Limited activity within patient's tolerance, Monitored during session, Patient requesting pain meds-RN notified, Repositioned    Home Living                           Prior Function            PT Goals (current goals can now be found in the care plan section) Progress towards PT goals: Progressing toward goals    Frequency    7X/week      PT Plan Current plan remains appropriate    Co-evaluation              AM-PAC PT "6 Clicks" Mobility   Outcome Measure  Help needed turning from your back to your side while in a flat bed without using bedrails?: None Help needed moving from lying on your back to sitting on the side of a flat bed without using bedrails?: A Little Help needed moving to and from a bed to a chair (including a wheelchair)?: A Little Help needed standing up from a chair using your arms (e.g., wheelchair or bedside chair)?: A Little Help needed to walk in hospital room?: A Little Help needed climbing 3-5 steps with a railing? : A Lot 6 Click Score: 18    End of Session Equipment Utilized During Treatment: Gait belt Activity Tolerance: Patient tolerated treatment well Patient left: in chair;with call bell/phone within reach;with chair alarm set;with family/visitor present Nurse Communication: Mobility status;Other (comment) PT Visit Diagnosis: Unsteadiness on feet (R26.81);History of falling (Z91.81);Other abnormalities of gait and mobility (R26.89);Muscle weakness (generalized) (M62.81);Pain Pain - Right/Left: Left Pain - part of body: Leg     Time: 9628-3662 PT Time Calculation (min) (ACUTE ONLY): 16 min  Charges:  $Gait Training: 8-22 mins                   Chesley Noon, PTA 06/15/22, 10:35 AM

## 2022-06-15 NOTE — Plan of Care (Signed)
  Problem: Education: Goal: Knowledge of condition and prescribed therapy will improve Outcome: Progressing   Problem: Cardiac: Goal: Will achieve and/or maintain adequate cardiac output Outcome: Progressing   Problem: Health Behavior/Discharge Planning: Goal: Ability to manage health-related needs will improve Outcome: Progressing   Problem: Cardiac: Goal: Will achieve and/or maintain adequate cardiac output Outcome: Progressing   Problem: Clinical Measurements: Goal: Will remain free from infection Outcome: Progressing   Problem: Clinical Measurements: Goal: Cardiovascular complication will be avoided Outcome: Progressing   Problem: Activity: Goal: Risk for activity intolerance will decrease Outcome: Progressing   Problem: Nutrition: Goal: Adequate nutrition will be maintained Outcome: Progressing   Problem: Elimination: Goal: Will not experience complications related to bowel motility Outcome: Progressing   Problem: Pain Managment: Goal: General experience of comfort will improve Outcome: Progressing   Problem: Safety: Goal: Ability to remain free from injury will improve Outcome: Progressing   Problem: Skin Integrity: Goal: Risk for impaired skin integrity will decrease Outcome: Progressing

## 2022-06-15 NOTE — TOC Progression Note (Signed)
Transition of Care [facility]) - Progression Note    Patient Details  Name: Jack [name] MRN: [id number] Date of Birth: Mar 26, 1933  [facility] CM/SW [place], RN Phone Number: 06/15/2022, 9:08 AM  Clinical Narrative:    [name] has offered a bed, I met with the patient and reviewed the options, he chose Jack [name], I called [facility] and requested auth for Jack [name] and for EMS , awaiting [employer] approval   Expected Discharge Plan: Home/Self Care Barriers to Discharge: Continued Medical Work up  Expected Discharge Plan and Services Expected Discharge Plan: Home/Self Care   Discharge Planning Services: CM Consult   Living arrangements for the past 2 months: Single Family Home                 DME Arranged: N/A         HH Arranged: NA           Social Determinants of Health (SDOH) Interventions    Readmission Risk Interventions    03/27/2021    1:02 PM  Readmission Risk Prevention Plan  Transportation Screening Complete  Medication Review (RN Care Manager) Complete  PCP or Specialist appointment within 3-5 days of discharge Complete  HRI or [place] Complete  SW Recovery Care/Counseling Consult Complete  Palliative Care Screening [place] Patient Refused

## 2022-06-15 NOTE — TOC Progression Note (Signed)
Transition of Care [facility]) - Progression Note    Patient Details  Name: Jack [name] MRN: [id number] Date of Birth: 1932/09/16  Transition of Care [facility]) CM/SW [place], RN Phone Number: 06/15/2022, 11:13 AM  Clinical Narrative:     [facility]  Ins approved for EMS and STR,  Wife is aware EMS arranged for 12 noon   Expected Discharge Plan: Home/Self Care Barriers to Discharge: Continued Medical Work up  Expected Discharge Plan and Services Expected Discharge Plan: Home/Self Care   Discharge Planning Services: CM Consult   Living arrangements for the past 2 months: Single Family Home Expected Discharge Date: 06/15/22               DME Arranged: N/A         HH Arranged: NA           Social Determinants of Health (SDOH) Interventions    Readmission Risk Interventions    03/27/2021    1:02 PM  Readmission Risk Prevention Plan  Transportation Screening Complete  Medication Review (RN Care Manager) Complete  PCP or Specialist appointment within 3-5 days of discharge Complete  HRI or [place] Complete  SW Recovery Care/Counseling Consult Complete  Palliative Care Screening Complete  [place] Patient Refused

## 2022-06-15 NOTE — Plan of Care (Signed)
Patient discharged per MD orders at this time.All discharge instructions,education & medications reviewed with the patient.Pt expressed understanding and will comply with dc instructions.follow up appointments was also communicated to the patient.no verbal c/o or any ssx of distress.Pt was discharged to the [facility].report was called to staff nurse before transport.Pt was transported by 2 [facility] on [facility] stretcher.

## 2022-06-15 NOTE — Discharge Summary (Signed)
Physician Discharge Summary   Patient: Jack [name] MRN: [id number] DOB: 10/17/1932  Admit date:     06/10/2022  Discharge date: 06/15/22  Discharge Physician: Max Sane   PCP: [name], MD   Recommendations at discharge:   Follow-up with outpatient providers as requested  Discharge Diagnoses: Principal Problem:   Pelvic fracture ([place]) Active Problems:   Prostate hypertrophy   Protein-calorie malnutrition, severe   Constipation   Atrial fibrillation (HCC)   Multiple pathological fractures   Multiple myeloma (HCC)   Postural dizziness with presyncope  Resolved Problems:   CKD (chronic kidney disease), stage IV [facility])  Hospital Course:  88 y.o. male with medical history significant of  hypertension, CKD stage IIIb, A. fib on Eliquis, GERD,BPH, HLD, Multiple myeloma  , chronic compression fracture with chronic back pain due to multiple myeloma admitted for pelvic fracture status post fall  9/29: PT, OT eval recommends SNF.  Ortho recommends medical management 9/30: Feeling constipated and hoping bowel regimen helps 10/1: Had good bowel movement.  Feeling better 10/2: SNF pending  Assessment and Plan: * Pelvic fracture ([place]) Seen by Ortho.  He has nondisplaced left superior pubic ramus fracture extending into the acetabulum and nondisplaced inferior pubic ramus fracture.  Ortho recommends conservative management with pain control and starting physical therapy.  Partial protected weightbearing status and outpatient Ortho follow-up in 2 weeks - PT and OT recommends SNF where he is being discharged  Postural dizziness with presyncope Positive orthostatics on admission.  Stop Flomax and cut back on the dose of Coreg from twice a day to once daily spouse in agreement  Multiple myeloma [facility]) Follows with oncology.  Continue treatment as an outpatient.  Multiple pathological fractures Chronic issues and is managed by her outpatient providers.  Continue pain  medicines  Atrial fibrillation (HCC) Permanent A-fib.  Continue Eliquis for anticoagulation.  Continue Coreg 25 mg once daily   Constipation Now resolved.  Continue Senokot and MiraLAX  CKD (chronic kidney disease), stage IV (HCC)-resolved as of 06/11/2022 Stable and at baseline         Consultants: Orthopedics Disposition: Skilled nursing facility Diet recommendation:  Discharge Diet Orders (From admission, onward)     Start     Ordered   06/15/22 0000  Diet - low sodium heart healthy        06/15/22 1030           Carb modified diet DISCHARGE MEDICATION: Allergies as of 06/15/2022       Reactions   Quinolones    Aortic dilation contraindicates FQ   Amlodipine Swelling, Other (See Comments)   LE edema   Azithromycin Nausea And Vomiting   Lipitor [atorvastatin]    Muscle pain in RT Leg    Lisinopril Cough   Zetia [ezetimibe]    Pain in legs         Medication List     STOP taking these medications    tamsulosin 0.4 MG Caps capsule Commonly known as: FLOMAX       TAKE these medications    acetaminophen 500 MG tablet Commonly known as: TYLENOL Take 500-1,000 mg by mouth every 8 (eight) hours as needed for moderate pain.   carvedilol 25 MG tablet Commonly known as: COREG Take 1 tablet (25 mg total) by mouth daily. What changed: when to take this   dexamethasone 4 MG tablet Commonly known as: DECADRON TAKE 5 TABLETS BY MOUTH ONE TIME PER WEEK   Eliquis 5 MG Tabs tablet Generic  drug: apixaban TAKE 1 TABLET BY MOUTH TWICE A DAY   feeding supplement Liqd Take 237 mLs by mouth 3 (three) times daily between meals. What changed: when to take this   gabapentin 300 MG capsule Commonly known as: NEURONTIN TAKE 2 CAPSULES BY MOUTH 3 TIMES DAILY. What changed: See the new instructions.   lenalidomide 20 MG capsule Commonly known as: REVLIMID Take 1 capsule (20 mg total) by mouth daily. Take for 21 days, then hold for 7 days. Repeat every 28  days   lidocaine 5 % ointment Commonly known as: XYLOCAINE Apply topically 3 (three) times daily as needed for mild pain or moderate pain. What changed: additional instructions   morphine 30 MG 12 hr tablet Commonly known as: MS CONTIN Take 1 tablet (30 mg total) by mouth every 12 (twelve) hours for 3 days.   morphine 15 MG tablet Commonly known as: MSIR Take 1 tablet (15 mg total) by mouth every 6 (six) hours as needed for up to 3 days for severe pain.   multivitamin with minerals Tabs tablet Take 1 tablet by mouth daily.   naloxone 4 MG/0.1ML Liqd nasal spray kit Commonly known as: NARCAN SPRAY 1 SPRAY INTO ONE NOSTRIL AS DIRECTED FOR OPIOID OVERDOSE (TURN PERSON ON SIDE AFTER DOSE. IF NO RESPONSE IN 2-3 MINUTES OR PERSON RESPONDS BUT RELAPSES, REPEAT USING A NEW SPRAY DEVICE AND SPRAY INTO THE OTHER NOSTRIL. CALL 911 AFTER USE.) * EMERGENCY USE ONLY *   Oyster Shell Calcium w/D 500-5 MG-MCG Tabs Take 1 tablet by mouth in the morning and at bedtime.   polyethylene glycol 17 g packet Commonly known as: MIRALAX / GLYCOLAX Take 17 g by mouth 2 (two) times daily. What changed: when to take this   potassium citrate 10 MEQ (1080 MG) SR tablet Commonly known as: UROCIT-K TAKE 1 TABLET (10 MEQ TOTAL) BY MOUTH 3 (THREE) TIMES DAILY WITH MEALS.   PRESERVISION AREDS PO Take 1 capsule by mouth in the morning and at bedtime.   senna-docusate 8.6-50 MG tablet Commonly known as: Senokot-S Take 1 tablet by mouth 2 (two) times daily.   vitamin C 1000 MG tablet Take 1,000 mg by mouth in the morning and at bedtime.        Contact information for after-discharge care     Destination     [facility] Preferred SNF .   Service: Skilled Nursing Contact information: [address] [place] [phone]                    Discharge Exam: Danley Danker Weights   06/10/22 0925 06/14/22 0500  Weight: 79.4 kg 16.43 kg   88 year old male  sitting in the chair comfortably without any acute distress Lungs clear to auscultation bilaterally Cardiovascular irregularly irregular heart rate Abdomen soft, benign Neuro alert and awake, nonfocal Extremities: Tenderness around the left hip/upper thigh Skin no rash or lesion Psych normal mood and affect  Condition at discharge: good  The results of significant diagnostics from this hospitalization (including imaging, microbiology, ancillary and laboratory) are listed below for reference.   Imaging Studies: US Venous Img Lower Bilateral (DVT)  Result Date: 06/12/2022 CLINICAL DATA:  Bilateral lower extremity edema.  Evaluate for DVT. EXAM: BILATERAL LOWER EXTREMITY VENOUS DOPPLER ULTRASOUND TECHNIQUE: Gray-scale sonography with graded compression, as well as color Doppler and duplex ultrasound were performed to evaluate the lower extremity deep venous systems from the level of the common femoral vein and including the common  femoral, femoral, profunda femoral, popliteal and calf veins including the posterior tibial, peroneal and gastrocnemius veins when visible. The superficial great saphenous vein was also interrogated. Spectral Doppler was utilized to evaluate flow at rest and with distal augmentation maneuvers in the common femoral, femoral and popliteal veins. COMPARISON:  None Available. FINDINGS: RIGHT LOWER EXTREMITY Common Femoral Vein: No evidence of thrombus. Normal compressibility, respiratory phasicity and response to augmentation. Saphenofemoral Junction: No evidence of thrombus. Normal compressibility and flow on color Doppler imaging. Profunda Femoral Vein: No evidence of thrombus. Normal compressibility and flow on color Doppler imaging. Femoral Vein: No evidence of thrombus. Normal compressibility, respiratory phasicity and response to augmentation. Popliteal Vein: No evidence of thrombus. Normal compressibility, respiratory phasicity and response to augmentation. Calf Veins: No  evidence of thrombus. Normal compressibility and flow on color Doppler imaging. Superficial Great Saphenous Vein: No evidence of thrombus. Normal compressibility. Other Findings:  None. LEFT LOWER EXTREMITY Common Femoral Vein: No evidence of thrombus. Normal compressibility, respiratory phasicity and response to augmentation. Saphenofemoral Junction: No evidence of thrombus. Normal compressibility and flow on color Doppler imaging. Profunda Femoral Vein: No evidence of thrombus. Normal compressibility and flow on color Doppler imaging. Femoral Vein: No evidence of thrombus. Normal compressibility, respiratory phasicity and response to augmentation. Popliteal Vein: No evidence of thrombus. Normal compressibility, respiratory phasicity and response to augmentation. Calf Veins: No evidence of thrombus. Normal compressibility and flow on color Doppler imaging. Superficial Great Saphenous Vein: No evidence of thrombus. Normal compressibility. Other Findings:  None. IMPRESSION: No evidence of DVT within either lower extremity. Electronically Signed   By: Sandi Mariscal M.D.   On: 06/12/2022 14:14   ECHOCARDIOGRAM COMPLETE  Result Date: 06/11/2022    ECHOCARDIOGRAM REPORT   Patient Name:   JEREL SARDINA [facility] Date of Exam: 06/11/2022 Medical Rec #:  [id number]       Height:       68.0 in Accession #:    [id number]      Weight:       175.0 lb Date of Birth:  Apr 29, 1933      BSA:          1.931 m Patient Age:    57 years        BP:           166/87 mmHg Patient Gender: M               HR:           55 bpm. Exam Location:  [facility] Procedure: 2D Echo, Cardiac Doppler and Color Doppler Indications:     Syncope R55  History:         Patient has prior history of Echocardiogram examinations, most                  recent 03/09/2022. Risk Factors:Hypertension. PAF, Ascending                  aortic aneurysm.  Sonographer:     Sherrie Sport Referring Phys:  [id number] SARA-MAIZ A THOMAS Diagnosing Phys: Ida Rogue MD IMPRESSIONS  1. Left  ventricular ejection fraction, by estimation, is 45 to 50%. The left ventricle has mildly decreased function. The left ventricle has no regional wall motion abnormalities. There is mild left ventricular hypertrophy. Left ventricular diastolic parameters are consistent with Grade I diastolic dysfunction (impaired relaxation).  2. Right ventricular systolic function is normal. The right ventricular size is normal.  3. The mitral valve is normal in structure. No  evidence of mitral valve regurgitation. No evidence of mitral stenosis.  4. The aortic valve is normal in structure. Aortic valve regurgitation is mild to moderate. No aortic stenosis is present.  5. There is mild dilatation of the aortic root, measuring 42 mm. There is moderate dilatation of the ascending aorta, measuring 46 mm.  6. The inferior vena cava is normal in size with greater than 50% respiratory variability, suggesting right atrial pressure of 3 mmHg. FINDINGS  Left Ventricle: Left ventricular ejection fraction, by estimation, is 45 to 50%. The left ventricle has mildly decreased function. The left ventricle has no regional wall motion abnormalities. The left ventricular internal cavity size was normal in size. There is mild left ventricular hypertrophy. Left ventricular diastolic parameters are consistent with Grade I diastolic dysfunction (impaired relaxation). Right Ventricle: The right ventricular size is normal. No increase in right ventricular wall thickness. Right ventricular systolic function is normal. Left Atrium: Left atrial size was normal in size. Right Atrium: Right atrial size was normal in size. Pericardium: There is no evidence of pericardial effusion. Mitral Valve: The mitral valve is normal in structure. No evidence of mitral valve regurgitation. No evidence of mitral valve stenosis. Tricuspid Valve: The tricuspid valve is normal in structure. Tricuspid valve regurgitation is not demonstrated. No evidence of tricuspid stenosis.  Aortic Valve: The aortic valve is normal in structure. Aortic valve regurgitation is mild to moderate. Aortic regurgitation PHT measures 517 msec. No aortic stenosis is present. Aortic valve mean gradient measures 5.0 mmHg. Aortic valve peak gradient measures 9.5 mmHg. Aortic valve area, by VTI measures 2.42 cm. Pulmonic Valve: The pulmonic valve was normal in structure. Pulmonic valve regurgitation is mild. No evidence of pulmonic stenosis. Aorta: The aortic root is normal in size and structure. There is mild dilatation of the aortic root, measuring 42 mm. There is moderate dilatation of the ascending aorta, measuring 46 mm. Venous: The inferior vena cava is normal in size with greater than 50% respiratory variability, suggesting right atrial pressure of 3 mmHg. IAS/Shunts: No atrial level shunt detected by color flow Doppler.  LEFT VENTRICLE PLAX 2D LVIDd:         3.10 cm      Diastology LVIDs:         2.40 cm      LV e' medial:    5.98 cm/s LV PW:         1.20 cm      LV E/e' medial:  9.1 LV IVS:        1.20 cm      LV e' lateral:   8.27 cm/s LVOT diam:     2.00 cm      LV E/e' lateral: 6.6 LV SV:         73 LV SV Index:   38 LVOT Area:     3.14 cm  LV Volumes (MOD) LV vol d, MOD A2C: 62.0 ml LV vol d, MOD A4C: 126.0 ml LV vol s, MOD A2C: 39.3 ml LV vol s, MOD A4C: 55.0 ml LV SV MOD A2C:     22.7 ml LV SV MOD A4C:     126.0 ml LV SV MOD BP:      47.4 ml RIGHT VENTRICLE RV Basal diam:  3.10 cm RV Mid diam:    3.20 cm RV S prime:     18.60 cm/s TAPSE (M-mode): 2.6 cm LEFT ATRIUM           Index  RIGHT ATRIUM           Index LA diam:      3.90 cm 2.02 cm/m   RA Area:     16.60 cm LA Vol (A2C): 31.3 ml 16.21 ml/m  RA Volume:   40.60 ml  21.03 ml/m LA Vol (A4C): 24.9 ml 12.89 ml/m  AORTIC VALVE AV Area (Vmax):    2.40 cm AV Area (Vmean):   2.32 cm AV Area (VTI):     2.42 cm AV Vmax:           154.50 cm/s AV Vmean:          103.500 cm/s AV VTI:            0.303 m AV Peak Grad:      9.5 mmHg AV Mean  Grad:      5.0 mmHg LVOT Vmax:         118.00 cm/s LVOT Vmean:        76.500 cm/s LVOT VTI:          0.233 m LVOT/AV VTI ratio: 0.77 AI PHT:            517 msec  AORTA Ao Root diam: 4.40 cm MITRAL VALVE                TRICUSPID VALVE MV Area (PHT): 3.93 cm     TR Peak grad:   12.0 mmHg MV Decel Time: 193 msec     TR Vmax:        173.00 cm/s MV E velocity: 54.50 cm/s MV A velocity: 116.00 cm/s  SHUNTS MV E/A ratio:  0.47         Systemic VTI:  0.23 m                             Systemic Diam: 2.00 cm Ida Rogue MD Electronically signed by Ida Rogue MD Signature Date/Time: 06/11/2022/12:52:32 PM    Final    DG Chest 1 View  Result Date: 06/10/2022 CLINICAL DATA:  Syncope. EXAM: CHEST  1 VIEW COMPARISON:  03/26/2021 and other prior studies. FINDINGS: The heart size and mediastinal contours are within normal limits. Stable mild elevation of the right hemidiaphragm. There is no evidence of pulmonary edema, consolidation, pneumothorax or pleural fluid. The visualized skeletal structures are unremarkable. IMPRESSION: No active disease. Electronically Signed   By: Aletta Edouard M.D.   On: 06/10/2022 16:40   CT PELVIS WO CONTRAST  Result Date: 06/10/2022 CLINICAL DATA:  Hip trauma.  Evaluate for fracture. EXAM: CT PELVIS WITHOUT CONTRAST TECHNIQUE: Multidetector CT imaging of the pelvis was performed following the standard protocol without intravenous contrast. RADIATION DOSE REDUCTION: This exam was performed according to the departmental dose-optimization program which includes automated exposure control, adjustment of the mA and/or kV according to patient size and/or use of iterative reconstruction technique. COMPARISON:  Left hip x-ray 06/10/2022. lumbar spine MRI 05/25/2022. FINDINGS: Urinary Tract: There are right renal calculi measuring up to 4 mm. There is a single punctate calculus in the bladder. The bladder is decompressed. Bowel:  Unremarkable visualized pelvic bowel loops. Vascular/Lymphatic:  No enlarged lymph nodes are seen. There are atherosclerotic calcifications of the aorta and iliac arteries. Reproductive: Prostate gland is significantly enlarged measuring 6.5 cm in transverse dimension. Calcifications are present. Other:  No ascites.  Small fat containing umbilical hernia. Musculoskeletal: The bones are diffusely osteopenic. There is an acute comminuted nondisplaced fracture of the  left inferior pubic ramus. There is an acute nondisplaced fracture of the lateral aspect of the left superior pubic ramus with extension into the anterior left acetabulum. There is no evidence for dislocation. There is a subtle acute fracture of the anterior cortex at the S2 level with some presacral edema and hemorrhage. This is best seen on coronal image 7/136. Vertebroplasty changes are seen at L3. There is minimal chronic compression deformity superior endplate of L4. No retropulsion of fracture fragments. Pubic symphysis and sacroiliac joints appear intact. Mild degenerative changes affect both hips. There is some soft tissue stranding and fluid surrounding the fractures. There is also some stranding and hemorrhage along the left pelvic sidewall. No discrete focal hematoma identified. IMPRESSION: 1. Acute nondisplaced fracture of the left superior pubic ramus with extension into the anterior left acetabulum. 2. Acute nondisplaced fracture of the left inferior pubic ramus. 3. Acute nondisplaced fracture anterior cortex of S2. 4. Stranding and hemorrhage along the left pelvic sidewall and presacral space. No discrete hematoma. 5. Nonobstructing right renal calculi. 6. Punctate bladder calculus. 7. Prostatomegaly. Electronically Signed   By: Ronney Asters M.D.   On: 06/10/2022 15:33   CT Head Wo Contrast  Result Date: 06/10/2022 CLINICAL DATA:  Provided history: Abnormal olfaction (CN 1). Neck trauma, abnormal mental status or neuro exam. Additional history provided: Fall. Laceration on top of head. Patient on  Eliquis. None. EXAM: CT HEAD WITHOUT CONTRAST CT CERVICAL SPINE WITHOUT CONTRAST TECHNIQUE: Multidetector CT imaging of the head and cervical spine was performed following the standard protocol without intravenous contrast. Multiplanar CT image reconstructions of the cervical spine were also generated. RADIATION DOSE REDUCTION: This exam was performed according to the departmental dose-optimization program which includes automated exposure control, adjustment of the mA and/or kV according to patient size and/or use of iterative reconstruction technique. COMPARISON:  None Available. FINDINGS: CT HEAD FINDINGS Brain: Mild generalized parenchymal atrophy. There is no acute intracranial hemorrhage. No demarcated cortical infarct. No extra-axial fluid collection. No evidence of an intracranial mass. No midline shift. Vascular: No hyperdense vessel.  Atherosclerotic calcifications. Skull: No fracture or aggressive osseous lesion. Sinuses/Orbits: Mucosal thickening, and small mucous retention cysts, within the right maxillary sinus. Minimal mucosal thickening within the left maxillary sinus. Minimal mucosal thickening within the bilateral ethmoid and frontal sinuses. Other: Left parietal scalp hematoma. CT CERVICAL SPINE FINDINGS Alignment: Slight grade 1 anterolisthesis at C4-C5. 3 mm C5-C6 grade 1 anterolisthesis. Slight grade 1 anterolisthesis at C6-C7. Skull base and vertebrae: The basion-dental and atlanto-dental intervals are maintained.No evidence of acute fracture to the cervical spine. Soft tissues and spinal canal: No prevertebral fluid or swelling. No visible canal hematoma. Disc levels: Cervical spondylosis with multilevel disc space narrowing, disc bulges/central disc protrusions, uncovertebral hypertrophy and facet arthrosis. No appreciable high-grade spinal canal stenosis. Multilevel bony neural foraminal narrowing. Degenerative changes are also present about the C1-C2 articulation. Upper chest: No  consolidation within the imaged lung apices. No visible pneumothorax. IMPRESSION: CT head: 1. No evidence of acute intracranial abnormality. 2. Left parietal scalp hematoma. 3. Mild generalized parenchymal atrophy. 4. Mild paranasal sinus disease, as described. CT cervical spine: 1. No evidence of acute fracture to the cervical spine. 2. Grade 1 anterolisthesis at C4-C5, C5-C6 and C6-C7. 3. Cervical spondylosis, as described. Electronically Signed   By: Kellie Simmering D.O.   On: 06/10/2022 10:10   CT Cervical Spine Wo Contrast  Result Date: 06/10/2022 CLINICAL DATA:  Provided history: Abnormal olfaction (CN 1). Neck trauma, abnormal mental status  or neuro exam. Additional history provided: Fall. Laceration on top of head. Patient on Eliquis. None. EXAM: CT HEAD WITHOUT CONTRAST CT CERVICAL SPINE WITHOUT CONTRAST TECHNIQUE: Multidetector CT imaging of the head and cervical spine was performed following the standard protocol without intravenous contrast. Multiplanar CT image reconstructions of the cervical spine were also generated. RADIATION DOSE REDUCTION: This exam was performed according to the departmental dose-optimization program which includes automated exposure control, adjustment of the mA and/or kV according to patient size and/or use of iterative reconstruction technique. COMPARISON:  None Available. FINDINGS: CT HEAD FINDINGS Brain: Mild generalized parenchymal atrophy. There is no acute intracranial hemorrhage. No demarcated cortical infarct. No extra-axial fluid collection. No evidence of an intracranial mass. No midline shift. Vascular: No hyperdense vessel.  Atherosclerotic calcifications. Skull: No fracture or aggressive osseous lesion. Sinuses/Orbits: Mucosal thickening, and small mucous retention cysts, within the right maxillary sinus. Minimal mucosal thickening within the left maxillary sinus. Minimal mucosal thickening within the bilateral ethmoid and frontal sinuses. Other: Left parietal  scalp hematoma. CT CERVICAL SPINE FINDINGS Alignment: Slight grade 1 anterolisthesis at C4-C5. 3 mm C5-C6 grade 1 anterolisthesis. Slight grade 1 anterolisthesis at C6-C7. Skull base and vertebrae: The basion-dental and atlanto-dental intervals are maintained.No evidence of acute fracture to the cervical spine. Soft tissues and spinal canal: No prevertebral fluid or swelling. No visible canal hematoma. Disc levels: Cervical spondylosis with multilevel disc space narrowing, disc bulges/central disc protrusions, uncovertebral hypertrophy and facet arthrosis. No appreciable high-grade spinal canal stenosis. Multilevel bony neural foraminal narrowing. Degenerative changes are also present about the C1-C2 articulation. Upper chest: No consolidation within the imaged lung apices. No visible pneumothorax. IMPRESSION: CT head: 1. No evidence of acute intracranial abnormality. 2. Left parietal scalp hematoma. 3. Mild generalized parenchymal atrophy. 4. Mild paranasal sinus disease, as described. CT cervical spine: 1. No evidence of acute fracture to the cervical spine. 2. Grade 1 anterolisthesis at C4-C5, C5-C6 and C6-C7. 3. Cervical spondylosis, as described. Electronically Signed   By: Kellie Simmering D.O.   On: 06/10/2022 10:10   DG Hip Unilat W or Wo Pelvis 2-3 Views Left  Result Date: 06/10/2022 CLINICAL DATA:  Fall, left hip pain EXAM: DG HIP (WITH OR WITHOUT PELVIS) 2-3V LEFT COMPARISON:  None Available. FINDINGS: Vertebroplasty material overlies chronic L4 compression fracture. Hernia repair mesh overlies the left pelvis. Acute nondisplaced fracture of the lateral aspect of the left superior pubic ramus, potentially extending into the medial wall of the left acetabulum. Acute nondisplaced fracture of the medial aspect of the inferior left pubic ramus extending into the left pubic symphysis. No hip dislocation. Mild bilateral hip osteoarthritis. No suspicious focal osseous lesions. IMPRESSION: Acute nondisplaced  fracture of the lateral aspect of the left superior pubic ramus, potentially extending into the medial wall of the left acetabulum. Acute nondisplaced fracture of the medial aspect of the inferior left pubic ramus extending into the left pubic symphysis. Electronically Signed   By: Ilona Sorrel M.D.   On: 06/10/2022 10:04   MR THORACIC SPINE WO CONTRAST  Result Date: 05/26/2022 CLINICAL DATA:  Mid to low back pain. History of multiple myeloma. Prior kyphoplasty. EXAM: MRI THORACIC AND LUMBAR SPINE WITHOUT CONTRAST TECHNIQUE: Multiplanar and multiecho pulse sequences of the thoracic and lumbar spine were obtained without intravenous contrast. COMPARISON:  None Available. FINDINGS: MRI THORACIC SPINE FINDINGS Alignment:  Physiologic. Vertebrae: Remote compression fractures of T11 and T12, status post vertebral augmentation. No acute fracture. Unchanged unenhanced appearance of lesions at T8-T9. Cord:  Normal signal and morphology. Paraspinal and other soft tissues: Negative. Disc levels: No spinal canal stenosis MRI LUMBAR SPINE FINDINGS Segmentation:  Standard Alignment:  Normal Vertebrae: Remote compression deformities at L1, L2 and L3, status post augmentation. No acute abnormality. No bone marrow abnormality at the other levels. Conus medullaris and cauda equina: Conus extends to the L1-2 level. Conus and cauda equina appear normal. Paraspinal and other soft tissues: Negative Disc levels: L1-L2: Normal disc space and facet joints. No spinal canal stenosis. No neural foraminal stenosis. L2-L3: Normal disc space and facet joints. No spinal canal stenosis. No neural foraminal stenosis. L3-L4: Small disc bulge. No spinal canal stenosis. No neural foraminal stenosis. L4-L5: Normal disc space and facet joints. No spinal canal stenosis. No neural foraminal stenosis. L5-S1: Normal disc space and facet joints. No spinal canal stenosis. No neural foraminal stenosis. Visualized sacrum: Normal. IMPRESSION: 1. Remote  compression fractures of T11, T12, L1, L2 and L3, status post vertebral augmentation. No acute abnormality of the thoracic or lumbar spine. 2. Previously described enhancing lesions at T8 and T9 are poorly characterized without contrast. There unenhanced appearance is unchanged. 3. No spinal canal or neural foraminal stenosis. Electronically Signed   By: Ulyses Jarred M.D.   On: 05/26/2022 03:32   MR LUMBAR SPINE WO CONTRAST  Result Date: 05/26/2022 CLINICAL DATA:  Mid to low back pain. History of multiple myeloma. Prior kyphoplasty. EXAM: MRI THORACIC AND LUMBAR SPINE WITHOUT CONTRAST TECHNIQUE: Multiplanar and multiecho pulse sequences of the thoracic and lumbar spine were obtained without intravenous contrast. COMPARISON:  None Available. FINDINGS: MRI THORACIC SPINE FINDINGS Alignment:  Physiologic. Vertebrae: Remote compression fractures of T11 and T12, status post vertebral augmentation. No acute fracture. Unchanged unenhanced appearance of lesions at T8-T9. Cord:  Normal signal and morphology. Paraspinal and other soft tissues: Negative. Disc levels: No spinal canal stenosis MRI LUMBAR SPINE FINDINGS Segmentation:  Standard Alignment:  Normal Vertebrae: Remote compression deformities at L1, L2 and L3, status post augmentation. No acute abnormality. No bone marrow abnormality at the other levels. Conus medullaris and cauda equina: Conus extends to the L1-2 level. Conus and cauda equina appear normal. Paraspinal and other soft tissues: Negative Disc levels: L1-L2: Normal disc space and facet joints. No spinal canal stenosis. No neural foraminal stenosis. L2-L3: Normal disc space and facet joints. No spinal canal stenosis. No neural foraminal stenosis. L3-L4: Small disc bulge. No spinal canal stenosis. No neural foraminal stenosis. L4-L5: Normal disc space and facet joints. No spinal canal stenosis. No neural foraminal stenosis. L5-S1: Normal disc space and facet joints. No spinal canal stenosis. No neural  foraminal stenosis. Visualized sacrum: Normal. IMPRESSION: 1. Remote compression fractures of T11, T12, L1, L2 and L3, status post vertebral augmentation. No acute abnormality of the thoracic or lumbar spine. 2. Previously described enhancing lesions at T8 and T9 are poorly characterized without contrast. There unenhanced appearance is unchanged. 3. No spinal canal or neural foraminal stenosis. Electronically Signed   By: Ulyses Jarred M.D.   On: 05/26/2022 03:32    Microbiology: Results for orders placed or performed in visit on 06/11/21  Microscopic Examination     Status: Abnormal   Collection Time: 06/11/21 11:21 AM   Urine  Result Value Ref Range Status   WBC, UA 0-5 0 - 5 /hpf Final   RBC, Urine 0-2 0 - 2 /hpf Final   Epithelial Cells (non renal) 0-10 0 - 10 /hpf Final   Casts Present (A) None seen /lpf Final  Cast Type Granular casts (A) N/A Final   Crystals Present (A) N/A Final   Crystal Type Calcium Oxalate N/A Final   Bacteria, UA None seen None seen/Few Final    Labs: CBC: Recent Labs  Lab 06/10/22 1152 06/11/22 0601 06/12/22 0553 06/13/22 0446  WBC 10.6* 5.5 5.3 5.6  NEUTROABS 8.6*  --   --   --   HGB 11.8* 9.6* 10.3* 10.2*  HCT 37.2* 29.8* 32.5* 32.4*  MCV 100.5* 100.3* 100.9* 100.0  PLT 81* 57* 54* 54*   Basic Metabolic Panel: Recent Labs  Lab 06/10/22 1152 06/11/22 0601 06/12/22 0553  NA 139 138 140  K 4.0 4.0 3.5  CL 105 112* 108  CO2 '24 24 27  ' GLUCOSE 117* 96 136*  BUN 27* 29* 24*  CREATININE 1.06 1.13 1.06  CALCIUM 9.2 7.9* 8.0*   Liver Function Tests: Recent Labs  Lab 06/10/22 1152 06/11/22 0601  AST 29 19  ALT 16 10  ALKPHOS 65 48  BILITOT 0.8 0.9  PROT 5.7* 4.7*  ALBUMIN 3.4* 2.8*   CBG: Recent Labs  Lab 06/13/22 1138 06/14/22 0738  GLUCAP 104* 128*    Discharge time spent: greater than 30 minutes.  Signed: Max Sane, MD [facility] 06/15/2022

## 2022-06-16 DIAGNOSIS — I10 Essential (primary) hypertension: Secondary | ICD-10-CM | POA: Diagnosis not present

## 2022-06-16 DIAGNOSIS — K219 Gastro-esophageal reflux disease without esophagitis: Secondary | ICD-10-CM | POA: Diagnosis not present

## 2022-06-16 DIAGNOSIS — R296 Repeated falls: Secondary | ICD-10-CM | POA: Diagnosis not present

## 2022-06-16 DIAGNOSIS — R42 Dizziness and giddiness: Secondary | ICD-10-CM | POA: Diagnosis not present

## 2022-06-16 DIAGNOSIS — N184 Chronic kidney disease, stage 4 (severe): Secondary | ICD-10-CM | POA: Diagnosis not present

## 2022-06-16 DIAGNOSIS — I480 Paroxysmal atrial fibrillation: Secondary | ICD-10-CM | POA: Diagnosis not present

## 2022-06-16 DIAGNOSIS — S32592S Other specified fracture of left pubis, sequela: Secondary | ICD-10-CM | POA: Diagnosis not present

## 2022-06-16 DIAGNOSIS — C9000 Multiple myeloma not having achieved remission: Secondary | ICD-10-CM | POA: Diagnosis not present

## 2022-06-16 DIAGNOSIS — M6281 Muscle weakness (generalized): Secondary | ICD-10-CM | POA: Diagnosis not present

## 2022-06-17 ENCOUNTER — Telehealth: Payer: Self-pay | Admitting: *Deleted

## 2022-06-17 NOTE — Telephone Encounter (Signed)
No answer at number left so I called patient # and wife answered, sSHe said not to worry about it, he is at [facility] and she has given them his medicine which they are giving him at night and he has 5 more days this cycle then will be on a 7 day break and they will see how thing=s go after that

## 2022-06-17 NOTE — Telephone Encounter (Signed)
Patient called and left a message with Answering service asking when he is to start taking his Revlimid. Please advise

## 2022-06-18 DIAGNOSIS — M6281 Muscle weakness (generalized): Secondary | ICD-10-CM | POA: Diagnosis not present

## 2022-06-18 DIAGNOSIS — N400 Enlarged prostate without lower urinary tract symptoms: Secondary | ICD-10-CM | POA: Diagnosis not present

## 2022-06-18 DIAGNOSIS — E785 Hyperlipidemia, unspecified: Secondary | ICD-10-CM | POA: Diagnosis not present

## 2022-06-18 DIAGNOSIS — C9000 Multiple myeloma not having achieved remission: Secondary | ICD-10-CM | POA: Diagnosis not present

## 2022-06-18 DIAGNOSIS — S329XXD Fracture of unspecified parts of lumbosacral spine and pelvis, subsequent encounter for fracture with routine healing: Secondary | ICD-10-CM | POA: Diagnosis not present

## 2022-06-18 DIAGNOSIS — R296 Repeated falls: Secondary | ICD-10-CM | POA: Diagnosis not present

## 2022-06-18 DIAGNOSIS — I480 Paroxysmal atrial fibrillation: Secondary | ICD-10-CM | POA: Diagnosis not present

## 2022-06-18 DIAGNOSIS — I700 Atherosclerosis of aorta: Secondary | ICD-10-CM | POA: Diagnosis not present

## 2022-06-18 DIAGNOSIS — I10 Essential (primary) hypertension: Secondary | ICD-10-CM | POA: Diagnosis not present

## 2022-06-18 DIAGNOSIS — S32592S Other specified fracture of left pubis, sequela: Secondary | ICD-10-CM | POA: Diagnosis not present

## 2022-06-18 DIAGNOSIS — K219 Gastro-esophageal reflux disease without esophagitis: Secondary | ICD-10-CM | POA: Diagnosis not present

## 2022-06-18 DIAGNOSIS — R42 Dizziness and giddiness: Secondary | ICD-10-CM | POA: Diagnosis not present

## 2022-06-18 DIAGNOSIS — N184 Chronic kidney disease, stage 4 (severe): Secondary | ICD-10-CM | POA: Diagnosis not present

## 2022-06-18 DIAGNOSIS — M8440XD Pathological fracture, unspecified site, subsequent encounter for fracture with routine healing: Secondary | ICD-10-CM | POA: Diagnosis not present

## 2022-06-21 DIAGNOSIS — I480 Paroxysmal atrial fibrillation: Secondary | ICD-10-CM | POA: Diagnosis not present

## 2022-06-21 DIAGNOSIS — N184 Chronic kidney disease, stage 4 (severe): Secondary | ICD-10-CM | POA: Diagnosis not present

## 2022-06-21 DIAGNOSIS — R296 Repeated falls: Secondary | ICD-10-CM | POA: Diagnosis not present

## 2022-06-21 DIAGNOSIS — K219 Gastro-esophageal reflux disease without esophagitis: Secondary | ICD-10-CM | POA: Diagnosis not present

## 2022-06-21 DIAGNOSIS — S32592S Other specified fracture of left pubis, sequela: Secondary | ICD-10-CM | POA: Diagnosis not present

## 2022-06-21 DIAGNOSIS — I10 Essential (primary) hypertension: Secondary | ICD-10-CM | POA: Diagnosis not present

## 2022-06-21 DIAGNOSIS — C9000 Multiple myeloma not having achieved remission: Secondary | ICD-10-CM | POA: Diagnosis not present

## 2022-06-21 DIAGNOSIS — M6281 Muscle weakness (generalized): Secondary | ICD-10-CM | POA: Diagnosis not present

## 2022-06-21 DIAGNOSIS — R42 Dizziness and giddiness: Secondary | ICD-10-CM | POA: Diagnosis not present

## 2022-06-22 DIAGNOSIS — I10 Essential (primary) hypertension: Secondary | ICD-10-CM | POA: Diagnosis not present

## 2022-06-22 DIAGNOSIS — S61501A Unspecified open wound of right wrist, initial encounter: Secondary | ICD-10-CM | POA: Diagnosis not present

## 2022-06-22 DIAGNOSIS — S51002A Unspecified open wound of left elbow, initial encounter: Secondary | ICD-10-CM | POA: Diagnosis not present

## 2022-06-22 DIAGNOSIS — M6281 Muscle weakness (generalized): Secondary | ICD-10-CM | POA: Diagnosis not present

## 2022-06-22 DIAGNOSIS — I5032 Chronic diastolic (congestive) heart failure: Secondary | ICD-10-CM | POA: Diagnosis not present

## 2022-06-22 DIAGNOSIS — I480 Paroxysmal atrial fibrillation: Secondary | ICD-10-CM | POA: Diagnosis not present

## 2022-06-22 DIAGNOSIS — S32592S Other specified fracture of left pubis, sequela: Secondary | ICD-10-CM | POA: Diagnosis not present

## 2022-06-22 DIAGNOSIS — M8440XD Pathological fracture, unspecified site, subsequent encounter for fracture with routine healing: Secondary | ICD-10-CM | POA: Diagnosis not present

## 2022-06-22 DIAGNOSIS — N184 Chronic kidney disease, stage 4 (severe): Secondary | ICD-10-CM | POA: Diagnosis not present

## 2022-06-22 DIAGNOSIS — C9000 Multiple myeloma not having achieved remission: Secondary | ICD-10-CM | POA: Diagnosis not present

## 2022-06-22 DIAGNOSIS — R42 Dizziness and giddiness: Secondary | ICD-10-CM | POA: Diagnosis not present

## 2022-06-22 DIAGNOSIS — R296 Repeated falls: Secondary | ICD-10-CM | POA: Diagnosis not present

## 2022-06-22 DIAGNOSIS — S51001A Unspecified open wound of right elbow, initial encounter: Secondary | ICD-10-CM | POA: Diagnosis not present

## 2022-06-22 DIAGNOSIS — N400 Enlarged prostate without lower urinary tract symptoms: Secondary | ICD-10-CM | POA: Diagnosis not present

## 2022-06-22 DIAGNOSIS — E785 Hyperlipidemia, unspecified: Secondary | ICD-10-CM | POA: Diagnosis not present

## 2022-06-22 DIAGNOSIS — S0100XA Unspecified open wound of scalp, initial encounter: Secondary | ICD-10-CM | POA: Diagnosis not present

## 2022-06-22 DIAGNOSIS — I700 Atherosclerosis of aorta: Secondary | ICD-10-CM | POA: Diagnosis not present

## 2022-06-22 DIAGNOSIS — S81802A Unspecified open wound, left lower leg, initial encounter: Secondary | ICD-10-CM | POA: Diagnosis not present

## 2022-06-22 DIAGNOSIS — S329XXD Fracture of unspecified parts of lumbosacral spine and pelvis, subsequent encounter for fracture with routine healing: Secondary | ICD-10-CM | POA: Diagnosis not present

## 2022-06-22 DIAGNOSIS — K219 Gastro-esophageal reflux disease without esophagitis: Secondary | ICD-10-CM | POA: Diagnosis not present

## 2022-06-23 ENCOUNTER — Other Ambulatory Visit: Payer: Self-pay

## 2022-06-23 DIAGNOSIS — M6281 Muscle weakness (generalized): Secondary | ICD-10-CM | POA: Diagnosis not present

## 2022-06-23 DIAGNOSIS — R296 Repeated falls: Secondary | ICD-10-CM | POA: Diagnosis not present

## 2022-06-23 DIAGNOSIS — S32592S Other specified fracture of left pubis, sequela: Secondary | ICD-10-CM | POA: Diagnosis not present

## 2022-06-23 DIAGNOSIS — R42 Dizziness and giddiness: Secondary | ICD-10-CM | POA: Diagnosis not present

## 2022-06-23 DIAGNOSIS — C9000 Multiple myeloma not having achieved remission: Secondary | ICD-10-CM

## 2022-06-23 DIAGNOSIS — N184 Chronic kidney disease, stage 4 (severe): Secondary | ICD-10-CM | POA: Diagnosis not present

## 2022-06-23 DIAGNOSIS — I480 Paroxysmal atrial fibrillation: Secondary | ICD-10-CM | POA: Diagnosis not present

## 2022-06-23 DIAGNOSIS — K219 Gastro-esophageal reflux disease without esophagitis: Secondary | ICD-10-CM | POA: Diagnosis not present

## 2022-06-23 DIAGNOSIS — I10 Essential (primary) hypertension: Secondary | ICD-10-CM | POA: Diagnosis not present

## 2022-06-23 MED ORDER — LENALIDOMIDE 20 MG PO CAPS: 20.0000 mg | ORAL_CAPSULE | Freq: Every day | ORAL | 0 refills | Status: DC

## 2022-06-25 DIAGNOSIS — N400 Enlarged prostate without lower urinary tract symptoms: Secondary | ICD-10-CM | POA: Diagnosis not present

## 2022-06-25 DIAGNOSIS — I700 Atherosclerosis of aorta: Secondary | ICD-10-CM | POA: Diagnosis not present

## 2022-06-25 DIAGNOSIS — C9000 Multiple myeloma not having achieved remission: Secondary | ICD-10-CM | POA: Diagnosis not present

## 2022-06-25 DIAGNOSIS — I480 Paroxysmal atrial fibrillation: Secondary | ICD-10-CM | POA: Diagnosis not present

## 2022-06-25 DIAGNOSIS — E785 Hyperlipidemia, unspecified: Secondary | ICD-10-CM | POA: Diagnosis not present

## 2022-06-25 DIAGNOSIS — M8440XD Pathological fracture, unspecified site, subsequent encounter for fracture with routine healing: Secondary | ICD-10-CM | POA: Diagnosis not present

## 2022-06-25 DIAGNOSIS — S329XXD Fracture of unspecified parts of lumbosacral spine and pelvis, subsequent encounter for fracture with routine healing: Secondary | ICD-10-CM | POA: Diagnosis not present

## 2022-06-25 DIAGNOSIS — I10 Essential (primary) hypertension: Secondary | ICD-10-CM | POA: Diagnosis not present

## 2022-06-28 ENCOUNTER — Inpatient Hospital Stay: Payer: PPO | Attending: Oncology | Admitting: Hospice and Palliative Medicine

## 2022-06-28 DIAGNOSIS — N184 Chronic kidney disease, stage 4 (severe): Secondary | ICD-10-CM | POA: Diagnosis not present

## 2022-06-28 DIAGNOSIS — I10 Essential (primary) hypertension: Secondary | ICD-10-CM | POA: Diagnosis not present

## 2022-06-28 DIAGNOSIS — S32511A Fracture of superior rim of right pubis, initial encounter for closed fracture: Secondary | ICD-10-CM | POA: Diagnosis not present

## 2022-06-28 DIAGNOSIS — M6281 Muscle weakness (generalized): Secondary | ICD-10-CM | POA: Diagnosis not present

## 2022-06-28 DIAGNOSIS — R296 Repeated falls: Secondary | ICD-10-CM | POA: Diagnosis not present

## 2022-06-28 DIAGNOSIS — C9000 Multiple myeloma not having achieved remission: Secondary | ICD-10-CM

## 2022-06-28 DIAGNOSIS — R42 Dizziness and giddiness: Secondary | ICD-10-CM | POA: Diagnosis not present

## 2022-06-28 DIAGNOSIS — K219 Gastro-esophageal reflux disease without esophagitis: Secondary | ICD-10-CM | POA: Diagnosis not present

## 2022-06-28 DIAGNOSIS — S32592S Other specified fracture of left pubis, sequela: Secondary | ICD-10-CM | POA: Diagnosis not present

## 2022-06-28 DIAGNOSIS — I480 Paroxysmal atrial fibrillation: Secondary | ICD-10-CM | POA: Diagnosis not present

## 2022-06-28 NOTE — Progress Notes (Signed)
Unable to reach patient by phone.  Voicemail left.  Will reschedule.

## 2022-06-29 ENCOUNTER — Encounter: Payer: Self-pay | Admitting: Oncology

## 2022-06-29 DIAGNOSIS — I480 Paroxysmal atrial fibrillation: Secondary | ICD-10-CM | POA: Diagnosis not present

## 2022-06-29 DIAGNOSIS — S81802A Unspecified open wound, left lower leg, initial encounter: Secondary | ICD-10-CM | POA: Diagnosis not present

## 2022-06-29 DIAGNOSIS — S51002A Unspecified open wound of left elbow, initial encounter: Secondary | ICD-10-CM | POA: Diagnosis not present

## 2022-06-29 DIAGNOSIS — S329XXD Fracture of unspecified parts of lumbosacral spine and pelvis, subsequent encounter for fracture with routine healing: Secondary | ICD-10-CM | POA: Diagnosis not present

## 2022-06-29 DIAGNOSIS — S61501A Unspecified open wound of right wrist, initial encounter: Secondary | ICD-10-CM | POA: Diagnosis not present

## 2022-06-29 DIAGNOSIS — S0100XA Unspecified open wound of scalp, initial encounter: Secondary | ICD-10-CM | POA: Diagnosis not present

## 2022-06-29 DIAGNOSIS — C9000 Multiple myeloma not having achieved remission: Secondary | ICD-10-CM | POA: Diagnosis not present

## 2022-06-29 DIAGNOSIS — I10 Essential (primary) hypertension: Secondary | ICD-10-CM | POA: Diagnosis not present

## 2022-06-29 DIAGNOSIS — N400 Enlarged prostate without lower urinary tract symptoms: Secondary | ICD-10-CM | POA: Diagnosis not present

## 2022-06-29 DIAGNOSIS — E785 Hyperlipidemia, unspecified: Secondary | ICD-10-CM | POA: Diagnosis not present

## 2022-06-29 DIAGNOSIS — S51001A Unspecified open wound of right elbow, initial encounter: Secondary | ICD-10-CM | POA: Diagnosis not present

## 2022-06-29 DIAGNOSIS — M8440XD Pathological fracture, unspecified site, subsequent encounter for fracture with routine healing: Secondary | ICD-10-CM | POA: Diagnosis not present

## 2022-06-29 DIAGNOSIS — I700 Atherosclerosis of aorta: Secondary | ICD-10-CM | POA: Diagnosis not present

## 2022-06-30 DIAGNOSIS — R42 Dizziness and giddiness: Secondary | ICD-10-CM | POA: Diagnosis not present

## 2022-06-30 DIAGNOSIS — I480 Paroxysmal atrial fibrillation: Secondary | ICD-10-CM | POA: Diagnosis not present

## 2022-06-30 DIAGNOSIS — M6281 Muscle weakness (generalized): Secondary | ICD-10-CM | POA: Diagnosis not present

## 2022-06-30 DIAGNOSIS — R296 Repeated falls: Secondary | ICD-10-CM | POA: Diagnosis not present

## 2022-06-30 DIAGNOSIS — S32592S Other specified fracture of left pubis, sequela: Secondary | ICD-10-CM | POA: Diagnosis not present

## 2022-06-30 DIAGNOSIS — N184 Chronic kidney disease, stage 4 (severe): Secondary | ICD-10-CM | POA: Diagnosis not present

## 2022-06-30 DIAGNOSIS — K219 Gastro-esophageal reflux disease without esophagitis: Secondary | ICD-10-CM | POA: Diagnosis not present

## 2022-06-30 DIAGNOSIS — I10 Essential (primary) hypertension: Secondary | ICD-10-CM | POA: Diagnosis not present

## 2022-06-30 DIAGNOSIS — C9000 Multiple myeloma not having achieved remission: Secondary | ICD-10-CM | POA: Diagnosis not present

## 2022-07-01 ENCOUNTER — Inpatient Hospital Stay: Payer: PPO | Admitting: Oncology

## 2022-07-01 ENCOUNTER — Encounter: Payer: Self-pay | Admitting: Pharmacist

## 2022-07-01 ENCOUNTER — Ambulatory Visit: Payer: PPO

## 2022-07-01 ENCOUNTER — Inpatient Hospital Stay: Payer: PPO | Admitting: Pharmacist

## 2022-07-01 ENCOUNTER — Encounter: Payer: PPO | Admitting: Hospice and Palliative Medicine

## 2022-07-01 ENCOUNTER — Other Ambulatory Visit: Payer: PPO

## 2022-07-06 ENCOUNTER — Ambulatory Visit: Payer: PPO | Admitting: Oncology

## 2022-07-06 ENCOUNTER — Ambulatory Visit: Payer: PPO

## 2022-07-06 ENCOUNTER — Other Ambulatory Visit: Payer: PPO

## 2022-07-06 DIAGNOSIS — S32592D Other specified fracture of left pubis, subsequent encounter for fracture with routine healing: Secondary | ICD-10-CM | POA: Diagnosis not present

## 2022-07-06 DIAGNOSIS — C9000 Multiple myeloma not having achieved remission: Secondary | ICD-10-CM | POA: Diagnosis not present

## 2022-07-06 DIAGNOSIS — I480 Paroxysmal atrial fibrillation: Secondary | ICD-10-CM | POA: Diagnosis not present

## 2022-07-06 DIAGNOSIS — R296 Repeated falls: Secondary | ICD-10-CM | POA: Diagnosis not present

## 2022-07-06 DIAGNOSIS — R42 Dizziness and giddiness: Secondary | ICD-10-CM | POA: Diagnosis not present

## 2022-07-06 DIAGNOSIS — K219 Gastro-esophageal reflux disease without esophagitis: Secondary | ICD-10-CM | POA: Diagnosis not present

## 2022-07-06 DIAGNOSIS — M6281 Muscle weakness (generalized): Secondary | ICD-10-CM | POA: Diagnosis not present

## 2022-07-06 DIAGNOSIS — R2689 Other abnormalities of gait and mobility: Secondary | ICD-10-CM | POA: Diagnosis not present

## 2022-07-06 DIAGNOSIS — I1310 Hypertensive heart and chronic kidney disease without heart failure, with stage 1 through stage 4 chronic kidney disease, or unspecified chronic kidney disease: Secondary | ICD-10-CM | POA: Diagnosis not present

## 2022-07-06 DIAGNOSIS — N184 Chronic kidney disease, stage 4 (severe): Secondary | ICD-10-CM | POA: Diagnosis not present

## 2022-07-07 ENCOUNTER — Other Ambulatory Visit: Payer: PPO

## 2022-07-07 ENCOUNTER — Ambulatory Visit: Payer: PPO

## 2022-07-07 ENCOUNTER — Encounter: Payer: PPO | Admitting: Hospice and Palliative Medicine

## 2022-07-08 ENCOUNTER — Ambulatory Visit: Payer: PPO | Admitting: Oncology

## 2022-07-08 ENCOUNTER — Other Ambulatory Visit: Payer: PPO

## 2022-07-08 ENCOUNTER — Ambulatory Visit: Payer: PPO

## 2022-07-08 NOTE — Progress Notes (Signed)
[place]  [phone] [phone]  ID: Jack [name] OB: Feb 05, 1933  MR#: [id number]  ZRA#:[id number]  Patient Care Team: [name], MD as PCP - General (Family Medicine) End, [name], MD as PCP - Cardiology (Cardiology) [name], MD (Endocrinology) Lloyd Huger, MD as Consulting Physician (Hematology and Oncology)  I connected with Jack [name] on 07/09/22 at 11:00 AM EDT by video enabled telemedicine visit and verified that I am speaking with the correct person using two identifiers.   I discussed the limitations, risks, security and privacy concerns of performing an evaluation and management service by telemedicine and the availability of in-person appointments. I also discussed with the patient that there may be a patient responsible charge related to this service. The patient expressed understanding and agreed to proceed.   Other persons participating in the visit and their role in the encounter: Patient, MD.  Patient's location: Home. Provider's location: Clinic.  CHIEF COMPLAINT: Stage II kappa chain myeloma.  INTERVAL HISTORY: Patient agreed to video assisted telemedicine visit for routine evaluation.  He had a fall approximately 1 month ago sustaining a fractured pelvis which required surgery followed by 2 weeks in rehab.  Patient has been home for approximately 1 week.  He no longer complains of pain and his mobility is significantly improved.  He otherwise feels well.  He has no neurologic complaints.  He denies any recent fevers or illnesses.  He has a good appetite and denies weight loss.  He has no chest pain, shortness of breath, cough, or hemoptysis.  He denies any nausea, vomiting, constipation, or diarrhea.  He has no urinary complaints.  Patient offers no further specific complaints today.  REVIEW OF SYSTEMS:   Review of Systems  Constitutional: Negative.  Negative for fever and malaise/fatigue.   Respiratory: Negative.  Negative for cough, hemoptysis and shortness of breath.   Cardiovascular: Negative.  Negative for chest pain and leg swelling.  Gastrointestinal: Negative.  Negative for abdominal pain.  Genitourinary: Negative.  Negative for dysuria and flank pain.  Musculoskeletal:  Positive for falls. Negative for back pain and joint pain.  Skin: Negative.  Negative for rash.  Neurological: Negative.  Negative for dizziness, focal weakness, weakness and headaches.  Psychiatric/Behavioral: Negative.  The patient is not nervous/anxious.     As per HPI. Otherwise, a complete review of systems is negative.  PAST MEDICAL HISTORY: Past Medical History:  Diagnosis Date   Ascending aortic aneurysm ([place])    a. 12/2020 Echo: Asc Ao 56m, Ao root 450m   CKD (chronic kidney disease), stage III (HCC)    Diastolic dysfunction    a. 12/2020 Echo: EF 50-55%, no rwma, mild LVH, GrI DD, nl RV fxn, mild AI. Asc Ao 4868mAo root 18m26m Elevated prostate specific antigen (PSA)    has been 7 for a year    GERD (gastroesophageal reflux disease)    History of colon polyps 2008   [facility] History of kidney stones    Hyperlipidemia    Hypertension    Myeloma (HCC)[place] PAF (paroxysmal atrial fibrillation) (HCC)[place] a. 01/2021-->Eliquis (CHA2DS2VASc = 3-4).   Pain    Prostate hypertrophy    diagnosed at age 65 d21 to hematospermia    PAST SURGICAL HISTORY: Past Surgical History:  Procedure Laterality Date   CATARACT EXTRACTION W/PHACO Left 01/10/2018   Procedure: CATARACT EXTRACTION PHACO AND INTRAOCULAR LENS PLACEMENT (IOC)[place]geon: PorfBirder Robson  MD;  Location: [facility] ORS;  Service: Ophthalmology;  Laterality: Left;  [name] 00:24.8 AP% 14.9 CDE 3.68 Fluid pack lot # [id number]   CATARACT EXTRACTION W/PHACO Right 01/25/2018   Procedure: CATARACT EXTRACTION PHACO AND INTRAOCULAR LENS PLACEMENT (IOC);  Surgeon: Birder Robson, MD;  Location: [facility] ORS;  Service: Ophthalmology;   Laterality: Right;  [name] 00:42 AP% 10.8 CDE 4.59 Fluid pack lot # [id number]   COLON SURGERY     CYSTOSCOPY W/ URETERAL STENT PLACEMENT Right 10/16/2017   Procedure: right  URETERAL STENT PLACEMENT,cystoscopy bilateral stent removal,rretrograde;  Surgeon: Abbie Sons, MD;  Location: [facility] ORS;  Service: Urology;  Laterality: Right;   CYSTOSCOPY/URETEROSCOPY/HOLMIUM LASER/STENT PLACEMENT Right 12/16/2020   Procedure: CYSTOSCOPY/URETEROSCOPY/HOLMIUM LASER/STENT PLACEMENT;  Surgeon: Abbie Sons, MD;  Location: [facility] ORS;  Service: Urology;  Laterality: Right;   EXTRACORPOREAL SHOCK WAVE LITHOTRIPSY Right 12/11/2020   Procedure: EXTRACORPOREAL SHOCK WAVE LITHOTRIPSY (ESWL);  Surgeon: Abbie Sons, MD;  Location: [facility] ORS;  Service: Urology;  Laterality: Right;   IR BONE TUMOR(S)RF ABLATION  04/16/2022   IR KYPHO EA ADDL LEVEL THORACIC OR LUMBAR  02/02/2021   IR KYPHO LUMBAR INC FX REDUCE BONE BX UNI/BIL CANNULATION INC/IMAGING  02/02/2021   IR KYPHO THORACIC WITH BONE BIOPSY  04/15/2022   KIDNEY STONE SURGERY     KYPHOPLASTY N/A 03/12/2021   Procedure: Hewitt Shorts, L3;  Surgeon: Hessie Knows, MD;  Location: [facility] ORS;  Service: Orthopedics;  Laterality: N/A;   RESECTION SOFT TISSUE TUMOR LEG / ANKLE RADICAL  jan 2009   [facility],  right thigh/knee , nonmalignant   SMALL INTESTINE SURGERY  1946   implaed on picket fence, punctured stomach   TONSILLECTOMY      FAMILY HISTORY: Family History  Problem Relation Age of Onset   Hypertension Father    Hyperlipidemia Father    Cancer Sister        thyroid - dx in late 20's    ADVANCED DIRECTIVES (Y/N):  N  HEALTH MAINTENANCE: Social History   Tobacco Use   Smoking status: Former    Types: Cigarettes    Quit date: 07/05/1965    Years since quitting: 57.0   Smokeless tobacco: Never  Vaping Use   Vaping Use: Never used  Substance Use Topics   Alcohol use: Yes    Alcohol/week: 1.0 standard drink of alcohol    Types: 1 Standard drinks or  equivalent per week    Comment: occassionaly   Drug use: No     Colonoscopy:  PAP:  Bone density:  Lipid panel:  Allergies  Allergen Reactions   Quinolones     Aortic dilation contraindicates FQ   Amlodipine Swelling and Other (See Comments)    LE edema   Azithromycin Nausea And Vomiting   Lipitor [Atorvastatin]     Muscle pain in RT Leg    Lisinopril Cough   Zetia [Ezetimibe]     Pain in legs     Current Outpatient Medications  Medication Sig Dispense Refill   Ascorbic Acid (VITAMIN C) 1000 MG tablet Take 1,000 mg by mouth in the morning and at bedtime.     Calcium Carb-Cholecalciferol (OYSTER SHELL CALCIUM W/D) 500-5 MG-MCG TABS Take 1 tablet by mouth in the morning and at bedtime.     carvedilol (COREG) 25 MG tablet Take 1 tablet (25 mg total) by mouth daily. 60 tablet 1   dexamethasone (DECADRON) 4 MG tablet TAKE 5 TABLETS BY MOUTH ONE TIME PER WEEK 20 tablet 4  ELIQUIS 5 MG TABS tablet TAKE 1 TABLET BY MOUTH TWICE A DAY 180 tablet 0   feeding supplement (ENSURE ENLIVE / ENSURE PLUS) LIQD Take 237 mLs by mouth 3 (three) times daily between meals. (Patient taking differently: Take 237 mLs by mouth daily with lunch.) 237 mL 12   gabapentin (NEURONTIN) 300 MG capsule TAKE 2 CAPSULES BY MOUTH 3 TIMES DAILY. (Patient taking differently: Take 600 mg by mouth 2 (two) times daily.) 180 capsule 2   lenalidomide (REVLIMID) 20 MG capsule Take 1 capsule (20 mg total) by mouth daily. Take for 21 days, then hold for 7 days. Repeat every 28 days 21 capsule 0   lidocaine (XYLOCAINE) 5 % ointment Apply topically 3 (three) times daily as needed for mild pain or moderate pain. (Patient taking differently: Apply topically 3 (three) times daily as needed for mild pain or moderate pain. Applies to leg) 50 g 2   morphine (MS CONTIN) 30 MG 12 hr tablet Take 1 tablet (30 mg total) by mouth every 12 (twelve) hours for 3 days. 6 tablet 0   Multiple Vitamin (MULTIVITAMIN WITH MINERALS) TABS tablet Take  1 tablet by mouth daily.     Multiple Vitamins-Minerals (PRESERVISION AREDS PO) Take 1 capsule by mouth in the morning and at bedtime.     naloxone (NARCAN) nasal spray 4 mg/0.1 mL SPRAY 1 SPRAY INTO ONE NOSTRIL AS DIRECTED FOR OPIOID OVERDOSE (TURN PERSON ON SIDE AFTER DOSE. IF NO RESPONSE IN 2-3 MINUTES OR PERSON RESPONDS BUT RELAPSES, REPEAT USING A NEW SPRAY DEVICE AND SPRAY INTO THE OTHER NOSTRIL. CALL 911 AFTER USE.) * EMERGENCY USE ONLY * 1 each 0   polyethylene glycol (MIRALAX / GLYCOLAX) 17 g packet Take 17 g by mouth 2 (two) times daily. (Patient taking differently: Take 17 g by mouth daily.)  0   potassium citrate (UROCIT-K) 10 MEQ (1080 MG) SR tablet TAKE 1 TABLET (10 MEQ TOTAL) BY MOUTH 3 (THREE) TIMES DAILY WITH MEALS. 270 tablet 2   senna-docusate (SENOKOT-S) 8.6-50 MG tablet Take 1 tablet by mouth 2 (two) times daily. 30 tablet 0   acetaminophen (TYLENOL) 500 MG tablet Take 500-1,000 mg by mouth every 8 (eight) hours as needed for moderate pain. (Patient not taking: Reported on 07/09/2022)     No current facility-administered medications for this visit.    OBJECTIVE: There were no vitals filed for this visit.     There is no height or weight on file to calculate BMI.    ECOG FS:1 - Symptomatic but completely ambulatory  General: Well-developed, well-nourished, no acute distress. HEENT: Normocephalic. Neuro: Alert, answering all questions appropriately. Cranial nerves grossly intact. Psych: Normal affect.  LAB RESULTS:  Lab Results  Component Value Date   NA 140 06/12/2022   K 3.5 06/12/2022   CL 108 06/12/2022   CO2 27 06/12/2022   GLUCOSE 136 (H) 06/12/2022   BUN 24 (H) 06/12/2022   CREATININE 1.06 06/12/2022   CALCIUM 8.0 (L) 06/12/2022   PROT 4.7 (L) 06/11/2022   ALBUMIN 2.8 (L) 06/11/2022   AST 19 06/11/2022   ALT 10 06/11/2022   ALKPHOS 48 06/11/2022   BILITOT 0.9 06/11/2022   GFRNONAA >60 06/12/2022   GFRAA 29 (L) 10/16/2017    Lab Results  Component  Value Date   WBC 5.6 06/13/2022   NEUTROABS 8.6 (H) 06/10/2022   HGB 10.2 (L) 06/13/2022   HCT 32.4 (L) 06/13/2022   MCV 100.0 06/13/2022   PLT 54 (L) 06/13/2022  STUDIES: US Venous Img Lower Bilateral (DVT)  Result Date: 06/12/2022 CLINICAL DATA:  Bilateral lower extremity edema.  Evaluate for DVT. EXAM: BILATERAL LOWER EXTREMITY VENOUS DOPPLER ULTRASOUND TECHNIQUE: Gray-scale sonography with graded compression, as well as color Doppler and duplex ultrasound were performed to evaluate the lower extremity deep venous systems from the level of the common femoral vein and including the common femoral, femoral, profunda femoral, popliteal and calf veins including the posterior tibial, peroneal and gastrocnemius veins when visible. The superficial great saphenous vein was also interrogated. Spectral Doppler was utilized to evaluate flow at rest and with distal augmentation maneuvers in the common femoral, femoral and popliteal veins. COMPARISON:  None Available. FINDINGS: RIGHT LOWER EXTREMITY Common Femoral Vein: No evidence of thrombus. Normal compressibility, respiratory phasicity and response to augmentation. Saphenofemoral Junction: No evidence of thrombus. Normal compressibility and flow on color Doppler imaging. Profunda Femoral Vein: No evidence of thrombus. Normal compressibility and flow on color Doppler imaging. Femoral Vein: No evidence of thrombus. Normal compressibility, respiratory phasicity and response to augmentation. Popliteal Vein: No evidence of thrombus. Normal compressibility, respiratory phasicity and response to augmentation. Calf Veins: No evidence of thrombus. Normal compressibility and flow on color Doppler imaging. Superficial Great Saphenous Vein: No evidence of thrombus. Normal compressibility. Other Findings:  None. LEFT LOWER EXTREMITY Common Femoral Vein: No evidence of thrombus. Normal compressibility, respiratory phasicity and response to augmentation. Saphenofemoral  Junction: No evidence of thrombus. Normal compressibility and flow on color Doppler imaging. Profunda Femoral Vein: No evidence of thrombus. Normal compressibility and flow on color Doppler imaging. Femoral Vein: No evidence of thrombus. Normal compressibility, respiratory phasicity and response to augmentation. Popliteal Vein: No evidence of thrombus. Normal compressibility, respiratory phasicity and response to augmentation. Calf Veins: No evidence of thrombus. Normal compressibility and flow on color Doppler imaging. Superficial Great Saphenous Vein: No evidence of thrombus. Normal compressibility. Other Findings:  None. IMPRESSION: No evidence of DVT within either lower extremity. Electronically Signed   By: Sandi Mariscal M.D.   On: 06/12/2022 14:14   ECHOCARDIOGRAM COMPLETE  Result Date: 06/11/2022    ECHOCARDIOGRAM REPORT   Patient Name:   [facility] Date of Exam: 06/11/2022 Medical Rec #:  [id number]       Height:       68.0 in Accession #:    [id number]      Weight:       175.0 lb Date of Birth:  May 18, 1933      BSA:          1.931 m Patient Age:    88 years        BP:           166/87 mmHg Patient Gender: M               HR:           55 bpm. Exam Location:  [facility] Procedure: 2D Echo, Cardiac Doppler and Color Doppler Indications:     Syncope R55  History:         Patient has prior history of Echocardiogram examinations, most                  recent 03/09/2022. Risk Factors:Hypertension. PAF, Ascending                  aortic aneurysm.  Sonographer:     Sherrie Sport Referring Phys:  [id number] SARA-MAIZ A THOMAS Diagnosing Phys: Ida Rogue MD IMPRESSIONS  1. Left ventricular ejection fraction, by  estimation, is 45 to 50%. The left ventricle has mildly decreased function. The left ventricle has no regional wall motion abnormalities. There is mild left ventricular hypertrophy. Left ventricular diastolic parameters are consistent with Grade I diastolic dysfunction (impaired relaxation).  2. Right ventricular  systolic function is normal. The right ventricular size is normal.  3. The mitral valve is normal in structure. No evidence of mitral valve regurgitation. No evidence of mitral stenosis.  4. The aortic valve is normal in structure. Aortic valve regurgitation is mild to moderate. No aortic stenosis is present.  5. There is mild dilatation of the aortic root, measuring 42 mm. There is moderate dilatation of the ascending aorta, measuring 46 mm.  6. The inferior vena cava is normal in size with greater than 50% respiratory variability, suggesting right atrial pressure of 3 mmHg. FINDINGS  Left Ventricle: Left ventricular ejection fraction, by estimation, is 45 to 50%. The left ventricle has mildly decreased function. The left ventricle has no regional wall motion abnormalities. The left ventricular internal cavity size was normal in size. There is mild left ventricular hypertrophy. Left ventricular diastolic parameters are consistent with Grade I diastolic dysfunction (impaired relaxation). Right Ventricle: The right ventricular size is normal. No increase in right ventricular wall thickness. Right ventricular systolic function is normal. Left Atrium: Left atrial size was normal in size. Right Atrium: Right atrial size was normal in size. Pericardium: There is no evidence of pericardial effusion. Mitral Valve: The mitral valve is normal in structure. No evidence of mitral valve regurgitation. No evidence of mitral valve stenosis. Tricuspid Valve: The tricuspid valve is normal in structure. Tricuspid valve regurgitation is not demonstrated. No evidence of tricuspid stenosis. Aortic Valve: The aortic valve is normal in structure. Aortic valve regurgitation is mild to moderate. Aortic regurgitation PHT measures 517 msec. No aortic stenosis is present. Aortic valve mean gradient measures 5.0 mmHg. Aortic valve peak gradient measures 9.5 mmHg. Aortic valve area, by VTI measures 2.42 cm. Pulmonic Valve: The pulmonic valve  was normal in structure. Pulmonic valve regurgitation is mild. No evidence of pulmonic stenosis. Aorta: The aortic root is normal in size and structure. There is mild dilatation of the aortic root, measuring 42 mm. There is moderate dilatation of the ascending aorta, measuring 46 mm. Venous: The inferior vena cava is normal in size with greater than 50% respiratory variability, suggesting right atrial pressure of 3 mmHg. IAS/Shunts: No atrial level shunt detected by color flow Doppler.  LEFT VENTRICLE PLAX 2D LVIDd:         3.10 cm      Diastology LVIDs:         2.40 cm      LV e' medial:    5.98 cm/s LV PW:         1.20 cm      LV E/e' medial:  9.1 LV IVS:        1.20 cm      LV e' lateral:   8.27 cm/s LVOT diam:     2.00 cm      LV E/e' lateral: 6.6 LV SV:         73 LV SV Index:   38 LVOT Area:     3.14 cm  LV Volumes (MOD) LV vol d, MOD A2C: 62.0 ml LV vol d, MOD A4C: 126.0 ml LV vol s, MOD A2C: 39.3 ml LV vol s, MOD A4C: 55.0 ml LV SV MOD A2C:     22.7 ml LV SV  MOD A4C:     126.0 ml LV SV MOD BP:      47.4 ml RIGHT VENTRICLE RV Basal diam:  3.10 cm RV Mid diam:    3.20 cm RV S prime:     18.60 cm/s TAPSE (M-mode): 2.6 cm LEFT ATRIUM           Index        RIGHT ATRIUM           Index LA diam:      3.90 cm 2.02 cm/m   RA Area:     16.60 cm LA Vol (A2C): 31.3 ml 16.21 ml/m  RA Volume:   40.60 ml  21.03 ml/m LA Vol (A4C): 24.9 ml 12.89 ml/m  AORTIC VALVE AV Area (Vmax):    2.40 cm AV Area (Vmean):   2.32 cm AV Area (VTI):     2.42 cm AV Vmax:           154.50 cm/s AV Vmean:          103.500 cm/s AV VTI:            0.303 m AV Peak Grad:      9.5 mmHg AV Mean Grad:      5.0 mmHg LVOT Vmax:         118.00 cm/s LVOT Vmean:        76.500 cm/s LVOT VTI:          0.233 m LVOT/AV VTI ratio: 0.77 AI PHT:            517 msec  AORTA Ao Root diam: 4.40 cm MITRAL VALVE                TRICUSPID VALVE MV Area (PHT): 3.93 cm     TR Peak grad:   12.0 mmHg MV Decel Time: 193 msec     TR Vmax:        173.00 cm/s MV E  velocity: 54.50 cm/s MV A velocity: 116.00 cm/s  SHUNTS MV E/A ratio:  0.47         Systemic VTI:  0.23 m                             Systemic Diam: 2.00 cm Ida Rogue MD Electronically signed by Ida Rogue MD Signature Date/Time: 06/11/2022/12:52:32 PM    Final    DG Chest 1 View  Result Date: 06/10/2022 CLINICAL DATA:  Syncope. EXAM: CHEST  1 VIEW COMPARISON:  03/26/2021 and other prior studies. FINDINGS: The heart size and mediastinal contours are within normal limits. Stable mild elevation of the right hemidiaphragm. There is no evidence of pulmonary edema, consolidation, pneumothorax or pleural fluid. The visualized skeletal structures are unremarkable. IMPRESSION: No active disease. Electronically Signed   By: Aletta Edouard M.D.   On: 06/10/2022 16:40   CT PELVIS WO CONTRAST  Result Date: 06/10/2022 CLINICAL DATA:  Hip trauma.  Evaluate for fracture. EXAM: CT PELVIS WITHOUT CONTRAST TECHNIQUE: Multidetector CT imaging of the pelvis was performed following the standard protocol without intravenous contrast. RADIATION DOSE REDUCTION: This exam was performed according to the departmental dose-optimization program which includes automated exposure control, adjustment of the mA and/or kV according to patient size and/or use of iterative reconstruction technique. COMPARISON:  Left hip x-ray 06/10/2022. lumbar spine MRI 05/25/2022. FINDINGS: Urinary Tract: There are right renal calculi measuring up to 4 mm. There is a single punctate calculus in the bladder. The  bladder is decompressed. Bowel:  Unremarkable visualized pelvic bowel loops. Vascular/Lymphatic: No enlarged lymph nodes are seen. There are atherosclerotic calcifications of the aorta and iliac arteries. Reproductive: Prostate gland is significantly enlarged measuring 6.5 cm in transverse dimension. Calcifications are present. Other:  No ascites.  Small fat containing umbilical hernia. Musculoskeletal: The bones are diffusely osteopenic.  There is an acute comminuted nondisplaced fracture of the left inferior pubic ramus. There is an acute nondisplaced fracture of the lateral aspect of the left superior pubic ramus with extension into the anterior left acetabulum. There is no evidence for dislocation. There is a subtle acute fracture of the anterior cortex at the S2 level with some presacral edema and hemorrhage. This is best seen on coronal image 7/136. Vertebroplasty changes are seen at L3. There is minimal chronic compression deformity superior endplate of L4. No retropulsion of fracture fragments. Pubic symphysis and sacroiliac joints appear intact. Mild degenerative changes affect both hips. There is some soft tissue stranding and fluid surrounding the fractures. There is also some stranding and hemorrhage along the left pelvic sidewall. No discrete focal hematoma identified. IMPRESSION: 1. Acute nondisplaced fracture of the left superior pubic ramus with extension into the anterior left acetabulum. 2. Acute nondisplaced fracture of the left inferior pubic ramus. 3. Acute nondisplaced fracture anterior cortex of S2. 4. Stranding and hemorrhage along the left pelvic sidewall and presacral space. No discrete hematoma. 5. Nonobstructing right renal calculi. 6. Punctate bladder calculus. 7. Prostatomegaly. Electronically Signed   By: Ronney Asters M.D.   On: 06/10/2022 15:33   CT Head Wo Contrast  Result Date: 06/10/2022 CLINICAL DATA:  Provided history: Abnormal olfaction (CN 1). Neck trauma, abnormal mental status or neuro exam. Additional history provided: Fall. Laceration on top of head. Patient on Eliquis. None. EXAM: CT HEAD WITHOUT CONTRAST CT CERVICAL SPINE WITHOUT CONTRAST TECHNIQUE: Multidetector CT imaging of the head and cervical spine was performed following the standard protocol without intravenous contrast. Multiplanar CT image reconstructions of the cervical spine were also generated. RADIATION DOSE REDUCTION: This exam was  performed according to the departmental dose-optimization program which includes automated exposure control, adjustment of the mA and/or kV according to patient size and/or use of iterative reconstruction technique. COMPARISON:  None Available. FINDINGS: CT HEAD FINDINGS Brain: Mild generalized parenchymal atrophy. There is no acute intracranial hemorrhage. No demarcated cortical infarct. No extra-axial fluid collection. No evidence of an intracranial mass. No midline shift. Vascular: No hyperdense vessel.  Atherosclerotic calcifications. Skull: No fracture or aggressive osseous lesion. Sinuses/Orbits: Mucosal thickening, and small mucous retention cysts, within the right maxillary sinus. Minimal mucosal thickening within the left maxillary sinus. Minimal mucosal thickening within the bilateral ethmoid and frontal sinuses. Other: Left parietal scalp hematoma. CT CERVICAL SPINE FINDINGS Alignment: Slight grade 1 anterolisthesis at C4-C5. 3 mm C5-C6 grade 1 anterolisthesis. Slight grade 1 anterolisthesis at C6-C7. Skull base and vertebrae: The basion-dental and atlanto-dental intervals are maintained.No evidence of acute fracture to the cervical spine. Soft tissues and spinal canal: No prevertebral fluid or swelling. No visible canal hematoma. Disc levels: Cervical spondylosis with multilevel disc space narrowing, disc bulges/central disc protrusions, uncovertebral hypertrophy and facet arthrosis. No appreciable high-grade spinal canal stenosis. Multilevel bony neural foraminal narrowing. Degenerative changes are also present about the C1-C2 articulation. Upper chest: No consolidation within the imaged lung apices. No visible pneumothorax. IMPRESSION: CT head: 1. No evidence of acute intracranial abnormality. 2. Left parietal scalp hematoma. 3. Mild generalized parenchymal atrophy. 4. Mild paranasal sinus disease, as  described. CT cervical spine: 1. No evidence of acute fracture to the cervical spine. 2. Grade 1  anterolisthesis at C4-C5, C5-C6 and C6-C7. 3. Cervical spondylosis, as described. Electronically Signed   By: Kellie Simmering D.O.   On: 06/10/2022 10:10   CT Cervical Spine Wo Contrast  Result Date: 06/10/2022 CLINICAL DATA:  Provided history: Abnormal olfaction (CN 1). Neck trauma, abnormal mental status or neuro exam. Additional history provided: Fall. Laceration on top of head. Patient on Eliquis. None. EXAM: CT HEAD WITHOUT CONTRAST CT CERVICAL SPINE WITHOUT CONTRAST TECHNIQUE: Multidetector CT imaging of the head and cervical spine was performed following the standard protocol without intravenous contrast. Multiplanar CT image reconstructions of the cervical spine were also generated. RADIATION DOSE REDUCTION: This exam was performed according to the departmental dose-optimization program which includes automated exposure control, adjustment of the mA and/or kV according to patient size and/or use of iterative reconstruction technique. COMPARISON:  None Available. FINDINGS: CT HEAD FINDINGS Brain: Mild generalized parenchymal atrophy. There is no acute intracranial hemorrhage. No demarcated cortical infarct. No extra-axial fluid collection. No evidence of an intracranial mass. No midline shift. Vascular: No hyperdense vessel.  Atherosclerotic calcifications. Skull: No fracture or aggressive osseous lesion. Sinuses/Orbits: Mucosal thickening, and small mucous retention cysts, within the right maxillary sinus. Minimal mucosal thickening within the left maxillary sinus. Minimal mucosal thickening within the bilateral ethmoid and frontal sinuses. Other: Left parietal scalp hematoma. CT CERVICAL SPINE FINDINGS Alignment: Slight grade 1 anterolisthesis at C4-C5. 3 mm C5-C6 grade 1 anterolisthesis. Slight grade 1 anterolisthesis at C6-C7. Skull base and vertebrae: The basion-dental and atlanto-dental intervals are maintained.No evidence of acute fracture to the cervical spine. Soft tissues and spinal canal: No  prevertebral fluid or swelling. No visible canal hematoma. Disc levels: Cervical spondylosis with multilevel disc space narrowing, disc bulges/central disc protrusions, uncovertebral hypertrophy and facet arthrosis. No appreciable high-grade spinal canal stenosis. Multilevel bony neural foraminal narrowing. Degenerative changes are also present about the C1-C2 articulation. Upper chest: No consolidation within the imaged lung apices. No visible pneumothorax. IMPRESSION: CT head: 1. No evidence of acute intracranial abnormality. 2. Left parietal scalp hematoma. 3. Mild generalized parenchymal atrophy. 4. Mild paranasal sinus disease, as described. CT cervical spine: 1. No evidence of acute fracture to the cervical spine. 2. Grade 1 anterolisthesis at C4-C5, C5-C6 and C6-C7. 3. Cervical spondylosis, as described. Electronically Signed   By: Kellie Simmering D.O.   On: 06/10/2022 10:10   DG Hip Unilat W or Wo Pelvis 2-3 Views Left  Result Date: 06/10/2022 CLINICAL DATA:  Fall, left hip pain EXAM: DG HIP (WITH OR WITHOUT PELVIS) 2-3V LEFT COMPARISON:  None Available. FINDINGS: Vertebroplasty material overlies chronic L4 compression fracture. Hernia repair mesh overlies the left pelvis. Acute nondisplaced fracture of the lateral aspect of the left superior pubic ramus, potentially extending into the medial wall of the left acetabulum. Acute nondisplaced fracture of the medial aspect of the inferior left pubic ramus extending into the left pubic symphysis. No hip dislocation. Mild bilateral hip osteoarthritis. No suspicious focal osseous lesions. IMPRESSION: Acute nondisplaced fracture of the lateral aspect of the left superior pubic ramus, potentially extending into the medial wall of the left acetabulum. Acute nondisplaced fracture of the medial aspect of the inferior left pubic ramus extending into the left pubic symphysis. Electronically Signed   By: Ilona Sorrel M.D.   On: 06/10/2022 10:04    ASSESSMENT: Stage II  Kappa chain myeloma.  PLAN:    Stage II kappa chain  myeloma: (11:14 translocation, high risk) SPEP essentially negative and immunoglobulins are within normal limits.  Patient's kappa light chains have improved to greater than 5400 down to 39.1.  Cycle 1 included Velcade and Revlimid, but given his declining performance status at the time and difficulty to get into clinic this was transitioned to Revlimid only.  Given persistent neutropenia, Revlimid was previously dose reduced to 15 mg daily, but patient is now tolerating an increased dose of 20 mg for 21 days with 7 days off.   He is also taking 20 mg dexamethasone weekly.  PET scan results from December 08, 2021 reviewed independently with no obvious evidence of progressive disease.  Patient initiated monthly Xgeva on August 11, 2021.  Continue 20 mg Revlimid daily as above.  Patient recently started his 21-day cycle of Revlimid and reports his next cycle after his 7-day break will be started on July 29, 2022.  He has been instructed to keep his previously scheduled follow-up appointment on August 02, 2022 for further evaluation.  Will reinitiate Xgeva at this appointment. Back pain: Chronic and unchanged.  PET scan results as above.  MRI results reviewed independently.  Continue current narcotic regimen.  Appreciate palliative care input.   Anemia: Chronic and unchanged.  Patient's most recent hemoglobin was 10.2. Right flank mass: MRI results from July 13, 2021 reviewed independently with no obvious pathology on his right flank, but did reveal several fractured ribs.  Continue narcotic regimen as above. Bone lesions: Continue Xgeva as above. Neutropenia: Resolved.  Continue Revlimid 20 mg as above. Thrombocytopenia: Patient last platelet count was on June 13, 2022 and reported 4.  Continue Revlimid as above and monitor closely.  Patient expressed understanding and was in agreement with this plan. He also understands that He can call clinic  at any time with any questions, concerns, or complaints.   I provided 30 minutes of face-to-face video visit time during this encounter which included chart review, counseling, and coordination of care as documented above.    Cancer Staging  Kappa light chain myeloma ([place]) Staging form: Plasma Cell Myeloma and Plasma Cell Disorders, AJCC 8th Edition - Clinical stage from 04/16/2021: Albumin (g/dL): 3.8, ISS: Stage II, High-risk cytogenetics: Absent, LDH: Unknown - Signed by Lloyd Huger, MD on 04/16/2021 Albumin range (g/dL): Greater than or equal to 3.5 Cytogenetics: t(11;14) translocation Serum calcium level: Normal Serum creatinine level: Normal Bone disease on imaging: Present  Lloyd Huger, MD   07/09/2022 11:42 AM

## 2022-07-09 ENCOUNTER — Inpatient Hospital Stay (HOSPITAL_BASED_OUTPATIENT_CLINIC_OR_DEPARTMENT_OTHER): Payer: PPO | Admitting: Oncology

## 2022-07-09 DIAGNOSIS — C9000 Multiple myeloma not having achieved remission: Secondary | ICD-10-CM

## 2022-07-12 DIAGNOSIS — H35033 Hypertensive retinopathy, bilateral: Secondary | ICD-10-CM | POA: Diagnosis not present

## 2022-07-12 DIAGNOSIS — H43812 Vitreous degeneration, left eye: Secondary | ICD-10-CM | POA: Diagnosis not present

## 2022-07-12 DIAGNOSIS — H353231 Exudative age-related macular degeneration, bilateral, with active choroidal neovascularization: Secondary | ICD-10-CM | POA: Diagnosis not present

## 2022-07-13 DIAGNOSIS — C9000 Multiple myeloma not having achieved remission: Secondary | ICD-10-CM | POA: Diagnosis not present

## 2022-07-13 DIAGNOSIS — Z8781 Personal history of (healed) traumatic fracture: Secondary | ICD-10-CM | POA: Diagnosis not present

## 2022-07-14 DIAGNOSIS — I1310 Hypertensive heart and chronic kidney disease without heart failure, with stage 1 through stage 4 chronic kidney disease, or unspecified chronic kidney disease: Secondary | ICD-10-CM | POA: Diagnosis not present

## 2022-07-14 DIAGNOSIS — K219 Gastro-esophageal reflux disease without esophagitis: Secondary | ICD-10-CM | POA: Diagnosis not present

## 2022-07-14 DIAGNOSIS — R296 Repeated falls: Secondary | ICD-10-CM | POA: Diagnosis not present

## 2022-07-14 DIAGNOSIS — R42 Dizziness and giddiness: Secondary | ICD-10-CM | POA: Diagnosis not present

## 2022-07-14 DIAGNOSIS — C9000 Multiple myeloma not having achieved remission: Secondary | ICD-10-CM | POA: Diagnosis not present

## 2022-07-14 DIAGNOSIS — R2689 Other abnormalities of gait and mobility: Secondary | ICD-10-CM | POA: Diagnosis not present

## 2022-07-14 DIAGNOSIS — N184 Chronic kidney disease, stage 4 (severe): Secondary | ICD-10-CM | POA: Diagnosis not present

## 2022-07-14 DIAGNOSIS — S32592D Other specified fracture of left pubis, subsequent encounter for fracture with routine healing: Secondary | ICD-10-CM | POA: Diagnosis not present

## 2022-07-14 DIAGNOSIS — M6281 Muscle weakness (generalized): Secondary | ICD-10-CM | POA: Diagnosis not present

## 2022-07-14 DIAGNOSIS — I480 Paroxysmal atrial fibrillation: Secondary | ICD-10-CM | POA: Diagnosis not present

## 2022-07-19 ENCOUNTER — Other Ambulatory Visit: Payer: Self-pay | Admitting: Oncology

## 2022-07-19 DIAGNOSIS — C9000 Multiple myeloma not having achieved remission: Secondary | ICD-10-CM

## 2022-07-20 NOTE — Progress Notes (Signed)
07/24/22 10:11 PM   Jack [name] 09/01/1933 [id number]  Referring provider:  [name], MD [phone] [place],  [place] [address]  Chief Complaint  Patient presents with   Follow-up   Urological history  Elevated PSA  -PSA (2020) 17.8 - On watchful waiting  -he refused further work-up   2. Nephrolithiasis  -Stone analysis was 100% uric acid - Right-sided ureteroscopy in [place] in 3557 that was complicated by proximal ureteral perforation with migrated right ureteral stent - CT on 12/08/2020 visualized obstructing 11 x 15 mm stone at the right ureteropelvic junction with moderate hydronephrosis and mild perinephric edema. - He is s/p ureteroscopic removal of right renal pelvic calculus after failed SWL on 12/16/2020 - has not completed a metabolic work up at this time - PET scan 11/2021 - punctate stone in the lower pole of left kidney  - Pelvic CT (05/2022) - right renal calculi measuring up to 4 mm.  There is a single punctate calculus in the bladder  3. Right ureteral perforation  -occurred with bilateral URS 2019  -imaging x several with no extravasation of contrast or hydronephrosis  HPI: Jack [name] is a 88 y.o.male who presents today for a 1 month follow up after being off tamsulosin for one month.   He had been on tamsulosin previously, but he had dizzy spells which caused a fall which resulted in a pelvic fracture.  When they called [name] to ask for refill on the tamsulosin, we declined stating the risks for hypotension on tamsulosin is too great at this time.  He returns today for reassessment of his symptoms while being off the tamsulosin.  He has noticed no differences in his urinary symptoms since being off the tamsulosin 0.4 mg daily.  His biggest complaint is nocturia x4.  Patient denies any modifying or aggravating factors.  Patient denies any gross hematuria, dysuria or suprapubic/flank pain.  Patient denies any fevers, chills, nausea or vomiting.     I PSS 17/3  0 mL    IPSS     Row Name 07/21/22 1500         International Prostate Symptom Score   How often have you had the sensation of not emptying your bladder? Less than half the time     How often have you had to urinate less than every two hours? About half the time     How often have you found you stopped and started again several times when you urinated? About half the time     How often have you found it difficult to postpone urination? About half the time     How often have you had a weak urinary stream? Less than half the time     How often have you had to strain to start urination? Not at All     How many times did you typically get up at night to urinate? 4 Times     Total IPSS Score 17       Quality of Life due to urinary symptoms   If you were to spend the rest of your life with your urinary condition just the way it is now how would you feel about that? Mixed              Score:  1-7 Mild 8-19 Moderate 20-35 Severe   PMH: Past Medical History:  Diagnosis Date   Ascending aortic aneurysm ([place])    a. 12/2020 Echo: Asc Ao 21m, Ao root  79m.   CKD (chronic kidney disease), stage III (HCC)    Diastolic dysfunction    a. 12/2020 Echo: EF 50-55%, no rwma, mild LVH, GrI DD, nl RV fxn, mild AI. Asc Ao 461m Ao root 4059m  Elevated prostate specific antigen (PSA)    has been 7 for a year    GERD (gastroesophageal reflux disease)    [facility]  [facility] of [facility] stones    Hyperlipidemia    Hypertension    [place]  [place]oxysmal [place] fibrillation) ([place]  a. 01/2021-->Eliquis (CHA2DS2VASc = 3-4).   Pain    Prostate hypertrophy    diagnosed at age [age]e to hematospermia    Surgical History: Past Surgical History:  Procedure Laterality Date   CATARACT EXTRACTION W/PHACO Left 01/10/2018   Procedure: CATARACT EXTRACTION PHACO AND INTRAOCULAR LENS PLACEMENT (IOC);  Surgeon: PorBirder RobsonD;   Location: [facility] ORS;  Service: Ophthalmology;  Laterality: Left;  [name]:24.8 AP% 14.9 CDE 3.68 Fluid pack lot # [id number]  CATARACT EXTRACTION W/PHACO Right 01/25/2018   Procedure: CATARACT EXTRACTION PHACO AND INTRAOCULAR LENS PLACEMENT (IOC);  Surgeon: PorBirder RobsonD;  Location: [facility] ORS;  Service: Ophthalmology;  Laterality: Right;  [name]:42 AP% 10.8 CDE 4.59 Fluid pack lot # [id number]  COLON SURGERY     CYSTOSCOPY W/ URETERAL STENT PLACEMENT Right 10/16/2017   Procedure: right  URETERAL STENT PLACEMENT,cystoscopy bilateral stent removal,rretrograde;  Surgeon: StoAbbie SonsD;  Location: [facility] ORS;  Service: Urology;  Laterality: Right;   CYSTOSCOPY/URETEROSCOPY/HOLMIUM LASER/STENT PLACEMENT Right 12/16/2020   Procedure: CYSTOSCOPY/URETEROSCOPY/HOLMIUM LASER/STENT PLACEMENT;  Surgeon: StoAbbie SonsD;  Location: [facility] ORS;  Service: Urology;  Laterality: Right;   EXTRACORPOREAL SHOCK WAVE LITHOTRIPSY Right 12/11/2020   Procedure: EXTRACORPOREAL SHOCK WAVE LITHOTRIPSY (ESWL);  Surgeon: StoAbbie SonsD;  Location: [facility] OR[facility];  Service: Urology;  Laterality: Right;   IR BONE TUMOR(S)RF ABLATION  04/16/2022   IR KYPHO EA ADDL LEVEL THORACIC OR LUMBAR  02/02/2021   IR KYPHO LUMBAR INC FX REDUCE BONE BX UNI/BIL CANNULATION INC/IMAGING  02/02/2021   IR KYPHO THORACIC WITH BONE BIOPSY  04/15/2022   KIDNEY STONE SURGERY     KYPHOPLASTY N/A 03/12/2021   Procedure: KYPHewitt Shorts3;  Surgeon: MenHessie KnowsD;  Location: [facility] ORS;  Service: Orthopedics;  Laterality: N/A;   RESECTION SOFT TISSUE TUMOR LEG / ANKLE RADICAL  jan 2009   [facility],  right thigh/knee , nonmalignant   SMALL INTESTINE SURGERY  1946   implaed on picket fence, punctured stomach   TONSILLECTOMY      Home Medications:  Allergies as of 07/21/2022       Reactions   Quinolones    Aortic dilation contraindicates FQ   Amlodipine Swelling, Other (See Comments)   LE edema   Azithromycin Nausea And Vomiting   Lipitor  [atorvastatin]    Muscle pain in RT Leg    Lisinopril Cough   Zetia [ezetimibe]    Pain in legs         Medication List        Accurate as of July 21, 2022 11:59 PM. If you have any questions, ask your nurse or doctor.          acetaminophen 500 MG tablet Commonly known as: TYLENOL Take 500-1,000 mg by mouth every 8 (eight) hours as needed for moderate pain.   carvedilol 25 MG tablet Commonly known  as: COREG Take 1 tablet (25 mg total) by mouth daily.   dexamethasone 4 MG tablet Commonly known as: DECADRON TAKE 5 TABLETS BY MOUTH ONE TIME PER WEEK   Eliquis 5 MG Tabs tablet Generic drug: apixaban TAKE 1 TABLET BY MOUTH TWICE A DAY   feeding supplement Liqd Take 237 mLs by mouth 3 (three) times daily between meals. What changed: when to take this   gabapentin 300 MG capsule Commonly known as: NEURONTIN TAKE 2 CAPSULES BY MOUTH 3 TIMES A DAY   Gemtesa 75 MG Tabs Generic drug: Vibegron Take 75 mg by mouth at bedtime. Started by: Zara Council, PA-C   lenalidomide 20 MG capsule Commonly known as: REVLIMID Take 1 capsule (20 mg total) by mouth daily. Take for 21 days, then hold for 7 days. Repeat every 28 days   lidocaine 5 % ointment Commonly known as: XYLOCAINE Apply topically 3 (three) times daily as needed for mild pain or moderate pain. What changed: additional instructions   multivitamin with minerals Tabs tablet Take 1 tablet by mouth daily.   mupirocin cream 2 % Commonly known as: BACTROBAN Apply 1 Application topically 2 (two) times daily. Started by: Zara Council, PA-C   naloxone 4 MG/0.1ML Liqd nasal spray kit Commonly known as: NARCAN SPRAY 1 SPRAY INTO ONE NOSTRIL AS DIRECTED FOR OPIOID OVERDOSE (TURN PERSON ON SIDE AFTER DOSE. IF NO RESPONSE IN 2-3 MINUTES OR PERSON RESPONDS BUT RELAPSES, REPEAT USING A NEW SPRAY DEVICE AND SPRAY INTO THE OTHER NOSTRIL. CALL 911 AFTER USE.) * EMERGENCY USE ONLY *   Oyster Shell Calcium w/D 500-5  MG-MCG Tabs Take 1 tablet by mouth in the morning and at bedtime.   polyethylene glycol 17 g packet Commonly known as: MIRALAX / GLYCOLAX Take 17 g by mouth 2 (two) times daily. What changed: when to take this   potassium citrate 10 MEQ (1080 MG) SR tablet Commonly known as: UROCIT-K TAKE 1 TABLET (10 MEQ TOTAL) BY MOUTH 3 (THREE) TIMES DAILY WITH MEALS.   PRESERVISION AREDS PO Take 1 capsule by mouth in the morning and at bedtime.   senna-docusate 8.6-50 MG tablet Commonly known as: Senokot-S Take 1 tablet by mouth 2 (two) times daily.   vitamin C 1000 MG tablet Take 1,000 mg by mouth in the morning and at bedtime.        Allergies:  Allergies  Allergen Reactions   Quinolones     Aortic dilation contraindicates FQ   Amlodipine Swelling and Other (See Comments)    LE edema   Azithromycin Nausea And Vomiting   Lipitor [Atorvastatin]     Muscle pain in RT Leg    Lisinopril Cough   Zetia [Ezetimibe]     Pain in legs     Family History: Family History  Problem Relation Age of Onset   Hypertension Father    Hyperlipidemia Father    Cancer Sister        thyroid - dx in late 20's    Social History:  reports that he quit smoking about 57 years ago. His smoking use included cigarettes. He has never used smokeless tobacco. He reports current alcohol use of about 1.0 standard drink of alcohol per week. He reports that he does not use drugs.   Physical Exam: BP 136/82   Pulse (!) 56   Ht _0  (1.676 m)   Wt 175 lb (79.4 kg)   BMI 28.25 kg/m   Constitutional:  Well nourished. Alert and oriented, No acute  distress. HEENT: [place] AT, moist mucus membranes.  Trachea midline Cardiovascular: No clubbing, cyanosis, or edema. Respiratory: Normal respiratory effort, no increased work of breathing. Neurologic: Grossly intact, no focal deficits, moving all 4 extremities. Psychiatric: Normal mood and affect.   Laboratory Data: Lab Results  Component Value Date   CREATININE  1.06 06/12/2022      Latest Ref Rng & Units 06/13/2022    4:46 AM 06/12/2022    5:53 AM 06/11/2022    6:01 AM  CBC  WBC 4.0 - 10.5 K/uL 5.6  5.3  5.5   Hemoglobin 13.0 - 17.0 g/dL 10.2  10.3  9.6   Hematocrit 39.0 - 52.0 % 32.4  32.5  29.8   Platelets 150 - 400 K/uL 54  54  57     Component     Latest Ref Rng 06/10/2022  Color, Urine     YELLOW  YELLOW !   Appearance     CLEAR  CLEAR !   Specific Gravity, Urine     1.005 - 1.030  1.018   pH     5.0 - 8.0  5.0   Glucose, UA     NEGATIVE mg/dL 150 !   Hgb urine dipstick     NEGATIVE  NEGATIVE   Bilirubin Urine     NEGATIVE  NEGATIVE   Ketones, ur     NEGATIVE mg/dL NEGATIVE   Protein     NEGATIVE mg/dL 30 !   Nitrite     NEGATIVE  NEGATIVE   Leukocytes,UA     Negative    RBC / HPF     0 - 5 RBC/hpf 0-5   WBC, UA     0 - 5 WBC/hpf 0-5   Bacteria, UA     NONE SEEN  RARE !   Squamous Epithelial / LPF     0 - 5  NONE SEEN   WBC Clumps   Mucus PRESENT   Specific Gravity, UA     1.005 - 1.030    pH, UA     5.0 - 7.5    Color, UA     Yellow    Appearance Ur     Clear    Protein,UA     Negative/Trace    Ketones, UA     Negative    RBC, UA     Negative    Bilirubin, UA     Negative    Urobilinogen, Ur     0.2 - 1.0 mg/dL   Nitrite, UA     Negative    Microscopic Examination   RBC, UA     0 - 2 /hpf   Epithelial Cells (non renal)     0 - 10 /hpf   Crystals     N/A    Crystal Type     N/A    Mucus, UA     Not Estab.    RBC, Urine     0 - 2 /hpf   Casts     None seen /lpf   Cast Type     N/A    Leukocytes,Ua     NEGATIVE  NEGATIVE     Legend: ! Abnormal I have reviewed the labs.    Pertinent Imaging:  07/21/22 15:14  Scan Result 25m  I have independently reviewed the films.  See HPI.    Assessment & Plan:    1. Nephrolithiasis  - asymptomatic at this time  2. Elevated PSA -he declined any  further work up at this time   3. Nocturia -PVR demonstrates adequate emptying -Gemtesa 75  mg daily, #42, samples given  Return for patient will let us know if Gemtesa samples work .  Alyanah Elliott, [place] [address], [place], [place] [address] [phone]

## 2022-07-21 ENCOUNTER — Ambulatory Visit: Payer: PPO | Admitting: Urology

## 2022-07-21 ENCOUNTER — Other Ambulatory Visit: Payer: Self-pay

## 2022-07-21 VITALS — BP 136/82 | HR 56 | Ht 66.0 in | Wt 175.0 lb

## 2022-07-21 DIAGNOSIS — Z87442 Personal history of urinary calculi: Secondary | ICD-10-CM

## 2022-07-21 DIAGNOSIS — N200 Calculus of kidney: Secondary | ICD-10-CM

## 2022-07-21 DIAGNOSIS — R351 Nocturia: Secondary | ICD-10-CM | POA: Diagnosis not present

## 2022-07-21 DIAGNOSIS — C9000 Multiple myeloma not having achieved remission: Secondary | ICD-10-CM

## 2022-07-21 DIAGNOSIS — R972 Elevated prostate specific antigen [PSA]: Secondary | ICD-10-CM | POA: Diagnosis not present

## 2022-07-21 LAB — BLADDER SCAN AMB NON-IMAGING

## 2022-07-21 MED ORDER — GEMTESA 75 MG PO TABS: 75.0000 mg | ORAL_TABLET | Freq: Every evening | ORAL | 0 refills | Status: DC

## 2022-07-21 MED ORDER — MUPIROCIN CALCIUM 2 % EX CREA: 1.0000 | TOPICAL_CREAM | Freq: Two times a day (BID) | CUTANEOUS | 0 refills | Status: DC

## 2022-07-21 MED ORDER — LENALIDOMIDE 20 MG PO CAPS: 20.0000 mg | ORAL_CAPSULE | Freq: Every day | ORAL | 0 refills | Status: DC

## 2022-07-22 ENCOUNTER — Other Ambulatory Visit: Payer: Self-pay

## 2022-07-22 ENCOUNTER — Other Ambulatory Visit: Payer: Self-pay | Admitting: Urology

## 2022-07-22 DIAGNOSIS — Z4802 Encounter for removal of sutures: Secondary | ICD-10-CM | POA: Diagnosis not present

## 2022-07-22 NOTE — Progress Notes (Signed)
ERROR

## 2022-07-24 ENCOUNTER — Encounter: Payer: Self-pay | Admitting: Urology

## 2022-07-28 ENCOUNTER — Ambulatory Visit: Payer: PPO | Admitting: Internal Medicine

## 2022-07-28 ENCOUNTER — Ambulatory Visit: Payer: PPO | Admitting: Urology

## 2022-07-28 DIAGNOSIS — S329XXD Fracture of unspecified parts of lumbosacral spine and pelvis, subsequent encounter for fracture with routine healing: Secondary | ICD-10-CM | POA: Diagnosis not present

## 2022-07-28 DIAGNOSIS — W19XXXD Unspecified fall, subsequent encounter: Secondary | ICD-10-CM | POA: Diagnosis not present

## 2022-07-29 ENCOUNTER — Ambulatory Visit: Payer: PPO | Admitting: Oncology

## 2022-07-29 ENCOUNTER — Inpatient Hospital Stay: Payer: PPO | Attending: Oncology | Admitting: Hospice and Palliative Medicine

## 2022-07-29 ENCOUNTER — Other Ambulatory Visit: Payer: PPO

## 2022-07-29 ENCOUNTER — Telehealth: Payer: PPO | Admitting: Hospice and Palliative Medicine

## 2022-07-29 ENCOUNTER — Ambulatory Visit: Payer: PPO

## 2022-07-29 DIAGNOSIS — C9000 Multiple myeloma not having achieved remission: Secondary | ICD-10-CM | POA: Insufficient documentation

## 2022-07-29 DIAGNOSIS — Z87891 Personal history of nicotine dependence: Secondary | ICD-10-CM | POA: Insufficient documentation

## 2022-07-29 DIAGNOSIS — Z515 Encounter for palliative care: Secondary | ICD-10-CM

## 2022-07-29 DIAGNOSIS — G893 Neoplasm related pain (acute) (chronic): Secondary | ICD-10-CM | POA: Diagnosis not present

## 2022-07-29 DIAGNOSIS — Z79899 Other long term (current) drug therapy: Secondary | ICD-10-CM | POA: Insufficient documentation

## 2022-07-29 MED ORDER — MORPHINE SULFATE ER 30 MG PO TBCR: 30.0000 mg | EXTENDED_RELEASE_TABLET | Freq: Two times a day (BID) | ORAL | 0 refills | Status: DC

## 2022-07-29 NOTE — Progress Notes (Signed)
Virtual Visit via Telephone Note  I connected with Jack [name] on 07/29/22 at  3:00 PM EST by telephone and verified that I am speaking with the correct person using two identifiers.  Location: Patient: Home Provider: Clinic   I discussed the limitations, risks, security and privacy concerns of performing an evaluation and management service by telephone and the availability of in person appointments. I also discussed with the patient that there may be a patient responsible charge related to this service. The patient expressed understanding and agreed to proceed.   History of Present Illness: Jack [name] is a 88 y.o. male with multiple medical problems including hypertension, CKD stage IIIb, A. fib on Eliquis, and anemia.  Patient was hospitalized 01/02/2021 to 01/07/2021 with T12 compression fracture with recommendation for conservative management including use of a TLSO brace.  Patient was hospitalized again 01/29/2021 to 02/07/2021 with recurrent back pain and MR showing T12 and new L1 compression fracture.  Patient underwent T12/L1 kyphoplasty on 02/02/2021.  There was also suspicion of a left fifth rib fracture, which occurred while transitioning from the stretcher for his kyphoplasty.  Patient had mild hypercalcemia with SPEP and UPEP concerning for possible myeloma.  Patient was [facility].  He was readmitted 03/11/2021 - 03/17/2021 with worsening back pain.  CT revealed new compression fractures of L2 and L3.  Patient underwent kyphoplasty on 6/30.  He was hospitalized again 03/26/2021-04/07/2021 with severe back/flank pain.  MRI revealed T10 pathologic compression fracture.  He was started on treatment for multiple myeloma with dexamethasone/Velcade and XRT to spine.   Patient is s/p T11 kyphoplasty and ablation.    Observations/Objective: Spoke with patient and wife by phone.    Patient reports he is slowly improving.  He continues to work with home health  physical therapy and is getting stronger.  He is able to ambulate now with use of a walker.  No recent falls.  Patient continues to endorse chronic back pain.  He is now taking MS Contin 30 mg every 12 hours and is utilizing intermittent Tylenol for breakthrough pain.  He feels like this is controlling his pain reasonably well.  Assessment and Plan: Chronic back pain secondary to multiple myeloma and history of recurrent/chronic compression fractures.  Continue MS Contin 30 mg every 12 hours.  We will refill MS Contin per patient request.  Follow Up Instructions: Follow-up telephone visit 1 to 2 months   I discussed the assessment and treatment plan with the patient. The patient was provided an opportunity to ask questions and all were answered. The patient agreed with the plan and demonstrated an understanding of the instructions.   The patient was advised to call back or seek an in-person evaluation if the symptoms worsen or if the condition fails to improve as anticipated.  I provided 10 minutes of non-face-to-face time during this encounter.   Irean Hong, NP

## 2022-07-30 ENCOUNTER — Telehealth: Payer: Self-pay

## 2022-07-30 DIAGNOSIS — I480 Paroxysmal atrial fibrillation: Secondary | ICD-10-CM | POA: Diagnosis not present

## 2022-07-30 DIAGNOSIS — R42 Dizziness and giddiness: Secondary | ICD-10-CM | POA: Diagnosis not present

## 2022-07-30 DIAGNOSIS — I1310 Hypertensive heart and chronic kidney disease without heart failure, with stage 1 through stage 4 chronic kidney disease, or unspecified chronic kidney disease: Secondary | ICD-10-CM | POA: Diagnosis not present

## 2022-07-30 DIAGNOSIS — C9000 Multiple myeloma not having achieved remission: Secondary | ICD-10-CM | POA: Diagnosis not present

## 2022-07-30 DIAGNOSIS — N184 Chronic kidney disease, stage 4 (severe): Secondary | ICD-10-CM | POA: Diagnosis not present

## 2022-07-30 DIAGNOSIS — M6281 Muscle weakness (generalized): Secondary | ICD-10-CM | POA: Diagnosis not present

## 2022-07-30 DIAGNOSIS — S32592D Other specified fracture of left pubis, subsequent encounter for fracture with routine healing: Secondary | ICD-10-CM | POA: Diagnosis not present

## 2022-07-30 NOTE — Telephone Encounter (Addendum)
PC SW outreached patient/spouse, to cancel [facility] NP upcoming mychart visit on Tue 11/21 and reschedule with [facility] RN/SW team for later date, per new PC program guidelines.   Call unsuccessful. SW LVM. Awaiting return call.

## 2022-08-02 ENCOUNTER — Inpatient Hospital Stay: Payer: PPO

## 2022-08-02 ENCOUNTER — Inpatient Hospital Stay (HOSPITAL_BASED_OUTPATIENT_CLINIC_OR_DEPARTMENT_OTHER): Payer: PPO | Admitting: Oncology

## 2022-08-02 VITALS — BP 120/81 | HR 57 | Temp 98.2°F | Resp 19 | Wt 174.6 lb

## 2022-08-02 DIAGNOSIS — Z09 Encounter for follow-up examination after completed treatment for conditions other than malignant neoplasm: Secondary | ICD-10-CM | POA: Diagnosis not present

## 2022-08-02 DIAGNOSIS — Z87891 Personal history of nicotine dependence: Secondary | ICD-10-CM | POA: Diagnosis not present

## 2022-08-02 DIAGNOSIS — C9000 Multiple myeloma not having achieved remission: Secondary | ICD-10-CM | POA: Diagnosis not present

## 2022-08-02 DIAGNOSIS — J9601 Acute respiratory failure with hypoxia: Secondary | ICD-10-CM | POA: Diagnosis not present

## 2022-08-02 DIAGNOSIS — R2689 Other abnormalities of gait and mobility: Secondary | ICD-10-CM | POA: Diagnosis not present

## 2022-08-02 DIAGNOSIS — Z87828 Personal history of other (healed) physical injury and trauma: Secondary | ICD-10-CM | POA: Diagnosis not present

## 2022-08-02 DIAGNOSIS — Z79899 Other long term (current) drug therapy: Secondary | ICD-10-CM | POA: Diagnosis not present

## 2022-08-02 DIAGNOSIS — S32592D Other specified fracture of left pubis, subsequent encounter for fracture with routine healing: Secondary | ICD-10-CM | POA: Diagnosis not present

## 2022-08-02 DIAGNOSIS — M6281 Muscle weakness (generalized): Secondary | ICD-10-CM | POA: Diagnosis not present

## 2022-08-02 LAB — CBC WITH DIFFERENTIAL/PLATELET
Abs Immature Granulocytes: 0.03 10*3/uL (ref 0.00–0.07)
Basophils Absolute: 0 10*3/uL (ref 0.0–0.1)
Basophils Relative: 1 %
Eosinophils Absolute: 0.1 10*3/uL (ref 0.0–0.5)
Eosinophils Relative: 3 %
HCT: 36.6 % — ABNORMAL LOW (ref 39.0–52.0)
Hemoglobin: 11.8 g/dL — ABNORMAL LOW (ref 13.0–17.0)
Immature Granulocytes: 1 %
Lymphocytes Relative: 31 %
Lymphs Abs: 0.9 10*3/uL (ref 0.7–4.0)
MCH: 31.6 pg (ref 26.0–34.0)
MCHC: 32.2 g/dL (ref 30.0–36.0)
MCV: 98.1 fL (ref 80.0–100.0)
Monocytes Absolute: 0.3 10*3/uL (ref 0.1–1.0)
Monocytes Relative: 11 %
Neutro Abs: 1.5 10*3/uL — ABNORMAL LOW (ref 1.7–7.7)
Neutrophils Relative %: 53 %
Platelets: 93 10*3/uL — ABNORMAL LOW (ref 150–400)
RBC: 3.73 MIL/uL — ABNORMAL LOW (ref 4.22–5.81)
RDW: 16.4 % — ABNORMAL HIGH (ref 11.5–15.5)
WBC: 2.8 10*3/uL — ABNORMAL LOW (ref 4.0–10.5)
nRBC: 0 % (ref 0.0–0.2)

## 2022-08-02 LAB — COMPREHENSIVE METABOLIC PANEL
ALT: 18 U/L (ref 0–44)
AST: 29 U/L (ref 15–41)
Albumin: 3.2 g/dL — ABNORMAL LOW (ref 3.5–5.0)
Alkaline Phosphatase: 116 U/L (ref 38–126)
Anion gap: 6 (ref 5–15)
BUN: 21 mg/dL (ref 8–23)
CO2: 24 mmol/L (ref 22–32)
Calcium: 8.8 mg/dL — ABNORMAL LOW (ref 8.9–10.3)
Chloride: 106 mmol/L (ref 98–111)
Creatinine, Ser: 1.11 mg/dL (ref 0.61–1.24)
GFR, Estimated: >60 mL/min (ref >60)
Glucose, Bld: 113 mg/dL — ABNORMAL HIGH (ref 70–99)
Potassium: 3.8 mmol/L (ref 3.5–5.1)
Sodium: 136 mmol/L (ref 135–145)
Total Bilirubin: 0.4 mg/dL (ref 0.3–1.2)
Total Protein: 5.7 g/dL — ABNORMAL LOW (ref 6.5–8.1)

## 2022-08-02 MED ORDER — DENOSUMAB 120 MG/1.7ML ~~LOC~~ SOLN
120.0000 mg | Freq: Once | SUBCUTANEOUS | Status: AC
  Administered 2022-08-02: 120 mg via SUBCUTANEOUS
  Filled 2022-08-02: qty 1.7

## 2022-08-02 NOTE — Progress Notes (Signed)
[place]  [phone] [phone]) [phone]  ID: [name] OB: 1933/03/18  MR#: [id number]  DJT#:[id number]  Patient Care Team: [name], MD as PCP - General (Family Medicine) End, [name], MD as PCP - Cardiology (Cardiology) [name], MD (Endocrinology) Lloyd Huger, MD as Consulting Physician (Hematology and Oncology)   CHIEF COMPLAINT: Stage II kappa chain myeloma.  INTERVAL HISTORY: Patient returns to clinic today for further evaluation and continuation of Revlimid.  He has significant back pain today, but admits he has not taken his narcotic prescription as recommended yesterday or this morning.  He otherwise feels well and is nearly fully recovered from his pelvic fracture recently.  He has no neurologic complaints.  He denies any recent fevers or illnesses.  He has a good appetite and denies weight loss.  He has no chest pain, shortness of breath, cough, or hemoptysis.  He denies any nausea, vomiting, constipation, or diarrhea.  He has no urinary complaints.  Patient offers no further specific complaints today.  REVIEW OF SYSTEMS:   Review of Systems  Constitutional: Negative.  Negative for fever and malaise/fatigue.  Respiratory: Negative.  Negative for cough, hemoptysis and shortness of breath.   Cardiovascular: Negative.  Negative for chest pain and leg swelling.  Gastrointestinal: Negative.  Negative for abdominal pain.  Genitourinary: Negative.  Negative for dysuria and flank pain.  Musculoskeletal:  Positive for back pain. Negative for falls and joint pain.  Skin: Negative.  Negative for rash.  Neurological: Negative.  Negative for dizziness, focal weakness, weakness and headaches.  Psychiatric/Behavioral: Negative.  The patient is not nervous/anxious.     As per HPI. Otherwise, a complete review of systems is negative.  PAST MEDICAL HISTORY: Past Medical History:  Diagnosis Date   Ascending aortic  aneurysm ([place])    a. 12/2020 Echo: Asc Ao 78m, Ao root 434m   CKD (chronic kidney disease), stage III (HCC)    Diastolic dysfunction    a. 12/2020 Echo: EF 50-55%, no rwma, mild LVH, GrI DD, nl RV fxn, mild AI. Asc Ao 4853mAo root 19m33m Elevated prostate specific antigen (PSA)    has been 7 for a year    GERD (gastroesophageal reflux disease)    History of colon polyps 2008   [facility] History of kidney stones    Hyperlipidemia    Hypertension    Myeloma (HCC)[place] PAF (paroxysmal atrial fibrillation) (HCC)[place] a. 01/2021-->Eliquis (CHA2DS2VASc = 3-4).   Pain    Prostate hypertrophy    diagnosed at age 59 d23 to hematospermia    PAST SURGICAL HISTORY: Past Surgical History:  Procedure Laterality Date   CATARACT EXTRACTION W/PHACO Left 01/10/2018   Procedure: CATARACT EXTRACTION PHACO AND INTRAOCULAR LENS PLACEMENT (IOC)[place]oydSurgeon: PorfBirder Robson;  Location: [facility] ORS;  Service: Ophthalmology;  Laterality: Left;  US 0[name]24.8 AP% 14.9 CDE 3.68 Fluid pack lot # [id number] CATARACT EXTRACTION W/PHACO Right 01/25/2018   Procedure: CATARACT EXTRACTION PHACO AND INTRAOCULAR LENS PLACEMENT (IOC);  Surgeon: PorfBirder Robson;  Location: [facility] ORS;  Service: Ophthalmology;  Laterality: Right;  US 0[name]42 AP% 10.8 CDE 4.59 Fluid pack lot # [id number] COLON SURGERY     CYSTOSCOPY W/ URETERAL STENT PLACEMENT Right 10/16/2017   Procedure: right  URETERAL STENT PLACEMENT,cystoscopy bilateral stent removal,rretrograde;  Surgeon: StoiAbbie Sons;  Location: [facility] ORS;  Service: Urology;  Laterality: Right;   CYSTOSCOPY/URETEROSCOPY/HOLMIUM LASER/STENT PLACEMENT  Right 12/16/2020   Procedure: CYSTOSCOPY/URETEROSCOPY/HOLMIUM LASER/STENT PLACEMENT;  Surgeon: Abbie Sons, MD;  Location: [facility] ORS;  Service: Urology;  Laterality: Right;   EXTRACORPOREAL SHOCK WAVE LITHOTRIPSY Right 12/11/2020   Procedure: EXTRACORPOREAL SHOCK WAVE LITHOTRIPSY (ESWL);  Surgeon: Abbie Sons, MD;   Location: [facility] ORS;  Service: Urology;  Laterality: Right;   IR BONE TUMOR(S)RF ABLATION  04/16/2022   IR KYPHO EA ADDL LEVEL THORACIC OR LUMBAR  02/02/2021   IR KYPHO LUMBAR INC FX REDUCE BONE BX UNI/BIL CANNULATION INC/IMAGING  02/02/2021   IR KYPHO THORACIC WITH BONE BIOPSY  04/15/2022   KIDNEY STONE SURGERY     KYPHOPLASTY N/A 03/12/2021   Procedure: Hewitt Shorts, L3;  Surgeon: Hessie Knows, MD;  Location: [facility] ORS;  Service: Orthopedics;  Laterality: N/A;   RESECTION SOFT TISSUE TUMOR LEG / ANKLE RADICAL  jan 2009   [facility],  right thigh/knee , nonmalignant   SMALL INTESTINE SURGERY  1946   implaed on picket fence, punctured stomach   TONSILLECTOMY      FAMILY HISTORY: Family History  Problem Relation Age of Onset   Hypertension Father    Hyperlipidemia Father    Cancer Sister        thyroid - dx in late 20's    ADVANCED DIRECTIVES (Y/N):  N  HEALTH MAINTENANCE: Social History   Tobacco Use   Smoking status: Former    Types: Cigarettes    Quit date: 07/05/1965    Years since quitting: 57.1   Smokeless tobacco: Never  Vaping Use   Vaping Use: Never used  Substance Use Topics   Alcohol use: Yes    Alcohol/week: 1.0 standard drink of alcohol    Types: 1 Standard drinks or equivalent per week    Comment: occassionaly   Drug use: No     Colonoscopy:  PAP:  Bone density:  Lipid panel:  Allergies  Allergen Reactions   Quinolones     Aortic dilation contraindicates FQ   Amlodipine Swelling and Other (See Comments)    LE edema   Azithromycin Nausea And Vomiting   Lipitor [Atorvastatin]     Muscle pain in RT Leg    Lisinopril Cough   Zetia [Ezetimibe]     Pain in legs     Current Outpatient Medications  Medication Sig Dispense Refill   acetaminophen (TYLENOL) 500 MG tablet Take 500-1,000 mg by mouth every 8 (eight) hours as needed for moderate pain.     Ascorbic Acid (VITAMIN C) 1000 MG tablet Take 1,000 mg by mouth in the morning and at bedtime.     Calcium  Carb-Cholecalciferol (OYSTER SHELL CALCIUM W/D) 500-5 MG-MCG TABS Take 1 tablet by mouth in the morning and at bedtime.     carvedilol (COREG) 25 MG tablet Take 1 tablet (25 mg total) by mouth daily. 60 tablet 1   dexamethasone (DECADRON) 4 MG tablet TAKE 5 TABLETS BY MOUTH ONE TIME PER WEEK 20 tablet 4   ELIQUIS 5 MG TABS tablet TAKE 1 TABLET BY MOUTH TWICE A DAY 180 tablet 0   feeding supplement (ENSURE ENLIVE / ENSURE PLUS) LIQD Take 237 mLs by mouth 3 (three) times daily between meals. (Patient taking differently: Take 237 mLs by mouth daily with lunch.) 237 mL 12   gabapentin (NEURONTIN) 300 MG capsule TAKE 2 CAPSULES BY MOUTH 3 TIMES A DAY 180 capsule 2   lenalidomide (REVLIMID) 20 MG capsule Take 1 capsule (20 mg total) by mouth daily. Take for 21 days, then hold  for 7 days. Repeat every 28 days 21 capsule 0   lidocaine (XYLOCAINE) 5 % ointment Apply topically 3 (three) times daily as needed for mild pain or moderate pain. (Patient taking differently: Apply topically 3 (three) times daily as needed for mild pain or moderate pain. Applies to leg) 50 g 2   morphine (MS CONTIN) 30 MG 12 hr tablet Take 1 tablet (30 mg total) by mouth every 12 (twelve) hours. 60 tablet 0   Multiple Vitamin (MULTIVITAMIN WITH MINERALS) TABS tablet Take 1 tablet by mouth daily.     Multiple Vitamins-Minerals (PRESERVISION AREDS PO) Take 1 capsule by mouth in the morning and at bedtime.     mupirocin ointment (BACTROBAN) 2 % Apply 1 Application topically 2 (two) times daily. 22 g 0   polyethylene glycol (MIRALAX / GLYCOLAX) 17 g packet Take 17 g by mouth 2 (two) times daily. (Patient taking differently: Take 17 g by mouth daily.)  0   potassium citrate (UROCIT-K) 10 MEQ (1080 MG) SR tablet TAKE 1 TABLET (10 MEQ TOTAL) BY MOUTH 3 (THREE) TIMES DAILY WITH MEALS. 270 tablet 2   senna-docusate (SENOKOT-S) 8.6-50 MG tablet Take 1 tablet by mouth 2 (two) times daily. 30 tablet 0   Vibegron (GEMTESA) 75 MG TABS Take 75 mg by  mouth at bedtime. 30 tablet 0   naloxone (NARCAN) nasal spray 4 mg/0.1 mL SPRAY 1 SPRAY INTO ONE NOSTRIL AS DIRECTED FOR OPIOID OVERDOSE (TURN PERSON ON SIDE AFTER DOSE. IF NO RESPONSE IN 2-3 MINUTES OR PERSON RESPONDS BUT RELAPSES, REPEAT USING A NEW SPRAY DEVICE AND SPRAY INTO THE OTHER NOSTRIL. CALL 911 AFTER USE.) * EMERGENCY USE ONLY * (Patient not taking: Reported on 08/02/2022) 1 each 0   No current facility-administered medications for this visit.    OBJECTIVE: Vitals:   08/02/22 1029  BP: 120/81  Pulse: (!) 57  Resp: 19  Temp: 98.2 F (36.8 C)  SpO2: 100%       Body mass index is 28.18 kg/m.    ECOG FS:1 - Symptomatic but completely ambulatory  General: Well-developed, well-nourished, no acute distress.  Sitting in a wheelchair. Eyes: Pink conjunctiva, anicteric sclera. HEENT: Normocephalic, moist mucous membranes. Lungs: No audible wheezing or coughing. Heart: Regular rate and rhythm. Abdomen: Soft, nontender, no obvious distention. Musculoskeletal: No edema, cyanosis, or clubbing. Neuro: Alert, answering all questions appropriately. Cranial nerves grossly intact. Skin: No rashes or petechiae noted. Psych: Normal affect.  LAB RESULTS:  Lab Results  Component Value Date   NA 136 08/02/2022   K 3.8 08/02/2022   CL 106 08/02/2022   CO2 24 08/02/2022   GLUCOSE 113 (H) 08/02/2022   BUN 21 08/02/2022   CREATININE 1.11 08/02/2022   CALCIUM 8.8 (L) 08/02/2022   PROT 5.7 (L) 08/02/2022   ALBUMIN 3.2 (L) 08/02/2022   AST 29 08/02/2022   ALT 18 08/02/2022   ALKPHOS 116 08/02/2022   BILITOT 0.4 08/02/2022   GFRNONAA >60 08/02/2022   GFRAA 29 (L) 10/16/2017    Lab Results  Component Value Date   WBC 2.8 (L) 08/02/2022   NEUTROABS 1.5 (L) 08/02/2022   HGB 11.8 (L) 08/02/2022   HCT 36.6 (L) 08/02/2022   MCV 98.1 08/02/2022   PLT 93 (L) 08/02/2022     STUDIES: No results found.  ASSESSMENT: Stage II Kappa chain myeloma.  PLAN:    Stage II kappa chain  myeloma: (11:14 translocation, high risk) SPEP essentially negative and immunoglobulins are within normal limits.  Patient's kappa  light chains have improved to greater than 5400 down to 39.1.  Cycle 1 included Velcade and Revlimid, but given his declining performance status at the time and difficulty to get into clinic this was transitioned to Revlimid only.  Given persistent neutropenia, Revlimid was previously dose reduced to 15 mg daily, but patient is now tolerating an increased dose of 20 mg for 21 days with 7 days off.   He is also taking 20 mg dexamethasone weekly.  PET scan results from December 08, 2021 reviewed independently with no obvious evidence of progressive disease.  Patient initiated monthly Xgeva on August 11, 2021.  Despite mild pancytopenia, will continue with 20 mg Revlimid daily as above.  Proceed with Xgeva today.  Return to clinic in 4 6 for further evaluation and continuation of treatment.   Anemia: Hemoglobin improved to 11.8.   Right flank mass: MRI results from July 13, 2021 reviewed independently with no obvious pathology on his right flank, but did reveal several fractured ribs.  Continue narcotic regimen as above. Bone lesions: Continue Xgeva as above. Neutropenia: Mild.  Patient's ANC is 1.5 today.  Continue Revlimid 20 mg as above. Thrombocytopenia: Improved.  Patient's platelet count is 93 today.  Continue Revlimid as above. Pain: Continue current narcotic treatment.  Patient has been instructed to take his prescriptions as recommended.   Patient expressed understanding and was in agreement with this plan. He also understands that He can call clinic at any time with any questions, concerns, or complaints.   I provided 30 minutes of face-to-face video visit time during this encounter which included chart review, counseling, and coordination of care as documented above.    Cancer Staging  Kappa light chain myeloma ([place]) Staging form: Plasma Cell Myeloma and Plasma  Cell Disorders, AJCC 8th Edition - Clinical stage from 04/16/2021: Albumin (g/dL): 3.8, ISS: Stage II, High-risk cytogenetics: Absent, LDH: Unknown - Signed by Lloyd Huger, MD on 04/16/2021 Albumin range (g/dL): Greater than or equal to 3.5 Cytogenetics: t(11;14) translocation Serum calcium level: Normal Serum creatinine level: Normal Bone disease on imaging: Present  Lloyd Huger, MD   08/02/2022 2:17 PM

## 2022-08-02 NOTE — Progress Notes (Signed)
Has chronic back pain. Pt reports it has gotten  worse. Needing a wheelchair today for the first time at this clinic. Revlimid started on 11/16 and will continue to 08/18/22. Pt left off morphine yesterday, he now knows he can't go without it. He hesitates to take it due to drowsiness. He did take the morphine this morning. Appetite is fair to good. Constipation is well managed.

## 2022-08-03 ENCOUNTER — Telehealth: Payer: PPO | Admitting: Nurse Practitioner

## 2022-08-03 LAB — KAPPA/LAMBDA LIGHT CHAINS
Kappa free light chain: 57 mg/L — ABNORMAL HIGH (ref 3.3–19.4)
Kappa, lambda light chain ratio: 4.49 — ABNORMAL HIGH (ref 0.26–1.65)
Lambda free light chains: 12.7 mg/L (ref 5.7–26.3)

## 2022-08-04 ENCOUNTER — Ambulatory Visit: Payer: PPO | Admitting: Urology

## 2022-08-04 LAB — IMMUNOGLOBULINS A/E/G/M, SERUM
IgA: 184 mg/dL (ref 61–437)
IgE (Immunoglobulin E), Serum: <2 IU/mL — ABNORMAL LOW (ref 6–495)
IgG (Immunoglobin G), Serum: 442 mg/dL — ABNORMAL LOW (ref 603–1613)
IgM (Immunoglobulin M), Srm: 25 mg/dL (ref 15–143)

## 2022-08-17 ENCOUNTER — Other Ambulatory Visit: Payer: Self-pay | Admitting: Urology

## 2022-08-17 DIAGNOSIS — N184 Chronic kidney disease, stage 4 (severe): Secondary | ICD-10-CM | POA: Diagnosis not present

## 2022-08-17 DIAGNOSIS — R42 Dizziness and giddiness: Secondary | ICD-10-CM | POA: Diagnosis not present

## 2022-08-17 DIAGNOSIS — M6281 Muscle weakness (generalized): Secondary | ICD-10-CM | POA: Diagnosis not present

## 2022-08-17 DIAGNOSIS — K219 Gastro-esophageal reflux disease without esophagitis: Secondary | ICD-10-CM | POA: Diagnosis not present

## 2022-08-17 DIAGNOSIS — R296 Repeated falls: Secondary | ICD-10-CM | POA: Diagnosis not present

## 2022-08-17 DIAGNOSIS — I1310 Hypertensive heart and chronic kidney disease without heart failure, with stage 1 through stage 4 chronic kidney disease, or unspecified chronic kidney disease: Secondary | ICD-10-CM | POA: Diagnosis not present

## 2022-08-17 DIAGNOSIS — S32592D Other specified fracture of left pubis, subsequent encounter for fracture with routine healing: Secondary | ICD-10-CM | POA: Diagnosis not present

## 2022-08-17 DIAGNOSIS — I480 Paroxysmal atrial fibrillation: Secondary | ICD-10-CM | POA: Diagnosis not present

## 2022-08-17 DIAGNOSIS — C9000 Multiple myeloma not having achieved remission: Secondary | ICD-10-CM | POA: Diagnosis not present

## 2022-08-17 DIAGNOSIS — R2689 Other abnormalities of gait and mobility: Secondary | ICD-10-CM | POA: Diagnosis not present

## 2022-08-18 ENCOUNTER — Other Ambulatory Visit: Payer: Self-pay

## 2022-08-18 DIAGNOSIS — C9000 Multiple myeloma not having achieved remission: Secondary | ICD-10-CM

## 2022-08-18 MED ORDER — LENALIDOMIDE 20 MG PO CAPS: 20.0000 mg | ORAL_CAPSULE | Freq: Every day | ORAL | 0 refills | Status: DC

## 2022-08-23 ENCOUNTER — Telehealth: Payer: Self-pay

## 2022-08-23 NOTE — Telephone Encounter (Signed)
220 pm.  Phone call made to spouse to follow up on patient and advise of changes in the [facility] program.  Mrs. Moncrieffe did not feel services were needed at this time and requested discharge.  Spouse aware that she can request a referral if services are needed in the future.

## 2022-08-25 ENCOUNTER — Ambulatory Visit: Payer: PPO | Attending: Internal Medicine | Admitting: Internal Medicine

## 2022-08-25 ENCOUNTER — Encounter: Payer: Self-pay | Admitting: Internal Medicine

## 2022-08-25 VITALS — BP 132/70 | HR 49 | Ht 64.0 in | Wt 176.0 lb

## 2022-08-25 DIAGNOSIS — E785 Hyperlipidemia, unspecified: Secondary | ICD-10-CM

## 2022-08-25 DIAGNOSIS — Z79899 Other long term (current) drug therapy: Secondary | ICD-10-CM

## 2022-08-25 DIAGNOSIS — I10 Essential (primary) hypertension: Secondary | ICD-10-CM

## 2022-08-25 DIAGNOSIS — I480 Paroxysmal atrial fibrillation: Secondary | ICD-10-CM

## 2022-08-25 DIAGNOSIS — G720 Drug-induced myopathy: Secondary | ICD-10-CM

## 2022-08-25 DIAGNOSIS — T466X5A Adverse effect of antihyperlipidemic and antiarteriosclerotic drugs, initial encounter: Secondary | ICD-10-CM | POA: Diagnosis not present

## 2022-08-25 DIAGNOSIS — I7121 Aneurysm of the ascending aorta, without rupture: Secondary | ICD-10-CM

## 2022-08-25 DIAGNOSIS — C9000 Multiple myeloma not having achieved remission: Secondary | ICD-10-CM | POA: Diagnosis not present

## 2022-08-25 MED ORDER — OMEPRAZOLE 20 MG PO CPDR: 20.0000 mg | DELAYED_RELEASE_CAPSULE | Freq: Every day | ORAL | 11 refills | Status: DC

## 2022-08-25 MED ORDER — CARVEDILOL 12.5 MG PO TABS: 12.5000 mg | ORAL_TABLET | Freq: Every day | ORAL | 3 refills | Status: DC

## 2022-08-25 MED ORDER — CARVEDILOL 12.5 MG PO TABS: 12.5000 mg | ORAL_TABLET | Freq: Two times a day (BID) | ORAL | 3 refills | Status: DC

## 2022-08-25 NOTE — Progress Notes (Unsigned)
Follow-up Outpatient Visit Date: 08/25/2022  Primary Care Provider: [name], MD [address] [address]  Chief Complaint: Follow-up atrial fibrillation  HPI:  Jack [name] is a 88 y.o. male with history of paroxysmal atrial fibrillation, PSVT, PACs, multifocal atrial tachycardia, coronary calcium, aortic atherosclerosis, hypertension, hyperlipidemia, hypertriglyceridemia, GERD, stage III chronic kidney disease, BPH, nephrolithiasis, remote tobacco abuse, and myeloma, who presents for follow-up of paroxysmal atrial fibrillation.  He was last seen in our office in May by Ignacia Bayley, NP, at which time he was feeling great.  No medication changes were made.  Repeat CTA chest in July showed dilated ascending aorta measuring up to 4.6 cm, similar in size to prior chest CT from 2019.  Today, Jack [name] reports that he is feeling fairly well.  He has continued to be bothered by pain, especially in his mid to low back, that he attributes to his multiple myeloma.  He has not been getting adequate relief from his morphine and just started taking ibuprofen with significant improvement.  He denies chest pain, shortness of breath, palpitations, and lightheadedness.  He had a fall in the setting of orthostatic lightheadedness earlier this year.  Tamsulosin was discontinued at that time without further episodes of lightheadedness.  He notes mild swelling in his feet, which comes and goes.  He was evaluated by Dr. Roxan Hockey in the cardiac surgery clinic in August; he recommended surveillance of thoracic aortic aneurysm with repeat CT planned in 1 year.  Statin and ezetimibe therapy are on hold, as Jack [name] did not tolerate either due to myalgias.  --------------------------------------------------------------------------------------------------  Past Medical History:  Diagnosis Date   Ascending aortic aneurysm ([place])    a. 12/2020 Echo: Asc Ao 35m, Ao root 483m b. 03/2022 Asc Ao 4.6 cm (4.5  cm in 2019)   CKD (chronic kidney disease), stage III (HCC)    Diastolic dysfunction    a. 12/2020 Echo: EF 50-55%, no rwma, mild LVH, GrI DD, nl RV fxn, mild AI. Asc Ao 4876mAo root 10m39m Elevated prostate specific antigen (PSA)    has been 7 for a year    GERD (gastroesophageal reflux disease)    History of colon polyps 2008   [facility] History of kidney stones    Hyperlipidemia    Hypertension    Myeloma (HCC)[place] PAF (paroxysmal atrial fibrillation) (HCC)[place] a. 01/2021-->Eliquis (CHA2DS2VASc = 3-4).   Pain    Prostate hypertrophy    diagnosed at age 85 d68 to hematospermia   Past Surgical History:  Procedure Laterality Date   CATARACT EXTRACTION W/PHACO Left 01/10/2018   Procedure: CATARACT EXTRACTION PHACO AND INTRAOCULAR LENS PLACEMENT (IOC)[place]geon: PorfBirder Robson;  Location: [facility];  Service: Ophthalmology;  Laterality: Left;  US 0[name]24.8 AP% 14.9 CDE 3.68 Fluid pack lot # [id number] CATARACT EXTRACTION W/PHACO Right 01/25/2018   Procedure: CATARACT EXTRACTION PHACO AND INTRAOCULAR LENS PLACEMENT (IOC);  Surgeon: PorfBirder Robson;  Location: [facility];  Service: Ophthalmology;  Laterality: Right;  US 0[name]42 AP% 10.8 CDE 4.59 Fluid pack lot # [id number] COLON SURGERY     CYSTOSCOPY W/ URETERAL STENT PLACEMENT Right 10/16/2017   Procedure: right  URETERAL STENT PLACEMENT,cystoscopy bilateral stent removal,rretrograde;  Surgeon: StoiAbbie Sons;  Location: [facility] OR[facility];  Service: Urology;  Laterality: Right;   CYSTOSCOPY/URETEROSCOPY/HOLMIUM LASER/STENT PLACEMENT Right 12/16/2020   Procedure: CYSTOSCOPY/URETEROSCOPY/HOLMIUM LASER/STENT PLACEMENT;  Surgeon: StoiAbbie Sons;  Location: [facility];  Service: Urology;  Laterality: Right;   EXTRACORPOREAL SHOCK WAVE LITHOTRIPSY Right 12/11/2020   Procedure: EXTRACORPOREAL SHOCK WAVE LITHOTRIPSY (ESWL);  Surgeon: Abbie Sons, MD;  Location: [facility];  Service: Urology;  Laterality: Right;   IR BONE  TUMOR(S)RF ABLATION  04/16/2022   IR KYPHO EA ADDL LEVEL THORACIC OR LUMBAR  02/02/2021   IR KYPHO LUMBAR INC FX REDUCE BONE BX UNI/BIL CANNULATION INC/IMAGING  02/02/2021   IR KYPHO THORACIC WITH BONE BIOPSY  04/15/2022   KIDNEY STONE SURGERY     KYPHOPLASTY N/A 03/12/2021   Procedure: Hewitt Shorts, L3;  Surgeon: Hessie Knows, MD;  Location: [facility] OR[facility];  Service: Orthopedics;  Laterality: N/A;   RESECTION SOFT TISSUE TUMOR LEG / ANKLE RADICAL  jan 2009   [facility],  right thigh/knee , nonmalignant   SMALL INTESTINE SURGERY  1946   implaed on picket fence, punctured stomach   TONSILLECTOMY       Recent CV Pertinent Labs: Lab Results  Component Value Date   CHOL 133 12/15/2021   CHOL 130 02/24/2021   HDL 41 12/15/2021   HDL 36 (L) 02/24/2021   LDLCALC 53 12/15/2021   LDLCALC 57 02/24/2021   LDLDIRECT 54 02/24/2021   LDLDIRECT 143.8 08/23/2013   TRIG 194 (H) 12/15/2021   CHOLHDL 3.2 12/15/2021   INR 1.1 04/15/2022   INR 1.1 01/24/2013   BNP 160.7 (H) 06/10/2022   K 3.8 08/02/2022   K 3.8 01/24/2013   MG 2.3 04/03/2021   BUN 21 08/02/2022   BUN 23 02/24/2021   BUN 20 (H) 01/24/2013   CREATININE 1.11 08/02/2022   CREATININE 1.52 (H) 01/24/2013    Past medical and surgical history were reviewed and updated in EPIC.  Current Meds  Medication Sig   acetaminophen (TYLENOL) 500 MG tablet Take 500-1,000 mg by mouth every 8 (eight) hours as needed for moderate pain.   Ascorbic Acid (VITAMIN C) 1000 MG tablet Take 1,000 mg by mouth in the morning and at bedtime.   Calcium Carb-Cholecalciferol (OYSTER SHELL CALCIUM W/D) 500-5 MG-MCG TABS Take 1 tablet by mouth in the morning and at bedtime.   carvedilol (COREG) 25 MG tablet Take 1 tablet (25 mg total) by mouth daily.   dexamethasone (DECADRON) 4 MG tablet TAKE 5 TABLETS BY MOUTH ONE TIME PER WEEK   ELIQUIS 5 MG TABS tablet TAKE 1 TABLET BY MOUTH TWICE A DAY   feeding supplement (ENSURE ENLIVE / ENSURE PLUS) LIQD Take 237 mLs by mouth 3  (three) times daily between meals. (Patient taking differently: Take 237 mLs by mouth daily with lunch.)   gabapentin (NEURONTIN) 300 MG capsule TAKE 2 CAPSULES BY MOUTH 3 TIMES A DAY   lenalidomide (REVLIMID) 20 MG capsule Take 1 capsule (20 mg total) by mouth daily. Take for 21 days, then hold for 7 days. Repeat every 28 days   lidocaine (XYLOCAINE) 5 % ointment Apply topically 3 (three) times daily as needed for mild pain or moderate pain.   morphine (MS CONTIN) 30 MG 12 hr tablet Take 1 tablet (30 mg total) by mouth every 12 (twelve) hours.   Multiple Vitamin (MULTIVITAMIN WITH MINERALS) TABS tablet Take 1 tablet by mouth daily.   Multiple Vitamins-Minerals (PRESERVISION AREDS PO) Take 1 capsule by mouth in the morning and at bedtime.   mupirocin ointment (BACTROBAN) 2 % Apply 1 Application topically 2 (two) times daily.   naloxone (NARCAN) nasal spray 4 mg/0.1 mL SPRAY 1 SPRAY INTO ONE NOSTRIL AS  DIRECTED FOR OPIOID OVERDOSE (TURN PERSON ON SIDE AFTER DOSE. IF NO RESPONSE IN 2-3 MINUTES OR PERSON RESPONDS BUT RELAPSES, REPEAT USING A NEW SPRAY DEVICE AND SPRAY INTO THE OTHER NOSTRIL. CALL 911 AFTER USE.) * EMERGENCY USE ONLY *   polyethylene glycol (MIRALAX / GLYCOLAX) 17 g packet Take 17 g by mouth 2 (two) times daily. (Patient taking differently: Take 17 g by mouth 2 (two) times daily as needed.)   potassium citrate (UROCIT-K) 10 MEQ (1080 MG) SR tablet TAKE 1 TABLET (10 MEQ TOTAL) BY MOUTH 3 (THREE) TIMES DAILY WITH MEALS.   senna-docusate (SENOKOT-S) 8.6-50 MG tablet Take 1 tablet by mouth 2 (two) times daily.    Allergies: Quinolones, Amlodipine, Azithromycin, Lipitor [atorvastatin], Lisinopril, and Zetia [ezetimibe]  Social History   Tobacco Use   Smoking status: Former    Types: Cigarettes    Quit date: 07/05/1965    Years since quitting: 57.1   Smokeless tobacco: Never  Vaping Use   Vaping Use: Never used  Substance Use Topics   Alcohol use: Not Currently    Alcohol/week: 1.0  standard drink of alcohol    Types: 1 Standard drinks or equivalent per week    Comment: occassionaly   Drug use: No    Family History  Problem Relation Age of Onset   Hypertension Father    Hyperlipidemia Father    Cancer Sister        thyroid - dx in late 20's    Review of Systems: A 12-system review of systems was performed and was negative except as noted in the HPI.  --------------------------------------------------------------------------------------------------  Physical Exam: BP (!) 148/86 (BP Location: Left Arm, Patient Position: Sitting, Cuff Size: Normal)   Pulse (!) 49   Ht _0  (1.626 m)   Wt 176 lb (79.8 kg)   SpO2 97%   BMI 30.21 kg/m  Repeat BP: 132/70  General:  NAD. Neck: No JVD or HJR. Lungs: Clear to auscultation bilaterally without wheezes or crackles. Heart: Bradycardic but regular without murmurs, rubs, or gallops. Abdomen: Soft, nontender, nondistended. Extremities: No lower extremity edema.  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  Heart rate has decreased slightly since 01/27/2022.  Otherwise, no significant change.  Lab Results  Component Value Date   WBC 2.8 (L) 08/02/2022   HGB 11.8 (L) 08/02/2022   HCT 36.6 (L) 08/02/2022   MCV 98.1 08/02/2022   PLT 93 (L) 08/02/2022    Lab Results  Component Value Date   NA 136 08/02/2022   K 3.8 08/02/2022   CL 106 08/02/2022   CO2 24 08/02/2022   BUN 21 08/02/2022   CREATININE 1.11 08/02/2022   GLUCOSE 113 (H) 08/02/2022   ALT 18 08/02/2022    Lab Results  Component Value Date   CHOL 133 12/15/2021   HDL 41 12/15/2021   LDLCALC 53 12/15/2021   LDLDIRECT 54 02/24/2021   TRIG 194 (H) 12/15/2021   CHOLHDL 3.2 12/15/2021    --------------------------------------------------------------------------------------------------  ASSESSMENT AND PLAN: Paroxysmal atrial fibrillation: Jack [name] is maintaining sinus rhythm and has not had any symptoms to suggest recurrent  atrial fibrillation.  I am somewhat concerned by his persistent bradycardia and fall a few months ago.  Though he has not had any further dizziness since discontinuation of tamsulosin, I think it would be prudent to decrease carvedilol to 12.5 mg twice daily.  We will plan to continue apixaban 5 mg twice daily for now, though if recurrent falls become a problem, duration  of anticoagulation will need to be considered.  Given that he plans to use ibuprofen daily for management of his chronic pain associated with multiple myeloma, I have recommended initiation of omeprazole 20 mg daily.  I will reach out to Dr. Woodfin Ganja again to ensure that this does not interact with any of Jack [name] myeloma therapy.  Hypertension: Blood pressure initially elevated, improved on recheck.  As above, we will decrease carvedilol.  I have asked Jack [name] to monitor his blood pressure at home and to alert [name] if it is consistently above 140/90, at which point we would need to consider addition of another antihypertensive agent.  Thoracic aortic aneurysm and hyperlipidemia complicated by statin myopathy: Thoracic aortic aneurysm fairly stable on room imaging earlier this year.  Continue follow-up with Dr. Roxan Hockey.  Given intolerance of statins and ezetimibe, we will plan to recheck a lipid panel.  If LDL is above 70, we will need to discuss rechallenging with an alternative statin, considering PCSK9 inhibitor/bempedoic acid, or deferring lipid therapy in the setting of Jack [name] advanced age and other comorbidities.  Multiple myeloma: Continue close follow-up with Dr. Grayland Ormond and the [place].  Follow-up: Return to clinic in 6 months.  Nelva Bush, MD 08/25/2022 4:20 PM

## 2022-08-25 NOTE — Patient Instructions (Addendum)
Medication Instructions:  Your physician recommends the following medication changes.  START TAKING: Omeprazole 20 mg by mouth daily   DECREASE: Carvedilol to 12.5 mg by mouth twice a day   *If you need a refill on your cardiac medications before your next appointment, please call your pharmacy*   Lab Work: Your physician recommends lab work (fasting lipid)  If you have labs (blood work) drawn today and your tests are completely normal, you will receive your results only by: [place] (if you have MyChart) OR A paper copy in the mail If you have any lab test that is abnormal or we need to change your treatment, we will call you to review the results.   Testing/Procedures: None ordered today   Follow-Up: At [facility], you and your health needs are our priority.  As part of our continuing mission to provide you with exceptional heart care, we have created designated Provider Care Teams.  These Care Teams include your primary Cardiologist (physician) and Advanced Practice Providers (APPs -  Physician Assistants and Nurse Practitioners) who all work together to provide you with the care you need, when you need it.  We recommend signing up for the patient portal called "MyChart".  Sign up information is provided on this After Visit Summary.  MyChart is used to connect with patients for Virtual Visits (Telemedicine).  Patients are able to view lab/test results, encounter notes, upcoming appointments, etc.  Non-urgent messages can be sent to your provider as well.   To learn more about what you can do with MyChart, go to [email or web address].    Your next appointment:   6 month(s)  The format for your next appointment:   In Person  Provider:   You may see Nelva Bush, MD or one of the following Advanced Practice Providers on your designated Care Team:   Murray Hodgkins, NP Christell Faith, PA-C Cadence Kathlen Mody, PA-C Gerrie Nordmann, NP

## 2022-08-26 ENCOUNTER — Encounter: Payer: Self-pay | Admitting: Internal Medicine

## 2022-08-26 ENCOUNTER — Other Ambulatory Visit: Payer: Self-pay

## 2022-08-26 ENCOUNTER — Encounter: Payer: Self-pay | Admitting: Oncology

## 2022-08-26 DIAGNOSIS — E785 Hyperlipidemia, unspecified: Secondary | ICD-10-CM

## 2022-08-26 DIAGNOSIS — G720 Drug-induced myopathy: Secondary | ICD-10-CM | POA: Insufficient documentation

## 2022-08-26 DIAGNOSIS — I7121 Aneurysm of the ascending aorta, without rupture: Secondary | ICD-10-CM | POA: Insufficient documentation

## 2022-08-26 DIAGNOSIS — T466X5A Adverse effect of antihyperlipidemic and antiarteriosclerotic drugs, initial encounter: Secondary | ICD-10-CM | POA: Insufficient documentation

## 2022-08-30 ENCOUNTER — Other Ambulatory Visit: Payer: Self-pay | Admitting: Hospice and Palliative Medicine

## 2022-08-30 MED ORDER — MORPHINE SULFATE ER 30 MG PO TBCR: 30.0000 mg | EXTENDED_RELEASE_TABLET | Freq: Two times a day (BID) | ORAL | 0 refills | Status: DC

## 2022-08-31 DIAGNOSIS — H353231 Exudative age-related macular degeneration, bilateral, with active choroidal neovascularization: Secondary | ICD-10-CM | POA: Diagnosis not present

## 2022-09-01 DIAGNOSIS — M6281 Muscle weakness (generalized): Secondary | ICD-10-CM | POA: Diagnosis not present

## 2022-09-01 DIAGNOSIS — R2689 Other abnormalities of gait and mobility: Secondary | ICD-10-CM | POA: Diagnosis not present

## 2022-09-01 DIAGNOSIS — S32592D Other specified fracture of left pubis, subsequent encounter for fracture with routine healing: Secondary | ICD-10-CM | POA: Diagnosis not present

## 2022-09-01 DIAGNOSIS — C9000 Multiple myeloma not having achieved remission: Secondary | ICD-10-CM | POA: Diagnosis not present

## 2022-09-01 DIAGNOSIS — J9601 Acute respiratory failure with hypoxia: Secondary | ICD-10-CM | POA: Diagnosis not present

## 2022-09-07 ENCOUNTER — Encounter: Payer: Self-pay | Admitting: Oncology

## 2022-09-07 DIAGNOSIS — I10 Essential (primary) hypertension: Secondary | ICD-10-CM | POA: Diagnosis not present

## 2022-09-07 DIAGNOSIS — Z6829 Body mass index (BMI) 29.0-29.9, adult: Secondary | ICD-10-CM | POA: Diagnosis not present

## 2022-09-07 DIAGNOSIS — Z515 Encounter for palliative care: Secondary | ICD-10-CM | POA: Diagnosis not present

## 2022-09-07 DIAGNOSIS — Z66 Do not resuscitate: Secondary | ICD-10-CM | POA: Diagnosis not present

## 2022-09-07 DIAGNOSIS — C9000 Multiple myeloma not having achieved remission: Secondary | ICD-10-CM

## 2022-09-07 MED ORDER — GABAPENTIN 300 MG PO CAPS: 600.0000 mg | ORAL_CAPSULE | Freq: Three times a day (TID) | ORAL | 2 refills | Status: DC

## 2022-09-09 ENCOUNTER — Inpatient Hospital Stay: Payer: PPO | Admitting: Pharmacist

## 2022-09-09 ENCOUNTER — Inpatient Hospital Stay (HOSPITAL_BASED_OUTPATIENT_CLINIC_OR_DEPARTMENT_OTHER): Payer: PPO | Admitting: Oncology

## 2022-09-09 ENCOUNTER — Inpatient Hospital Stay: Payer: PPO

## 2022-09-09 ENCOUNTER — Ambulatory Visit: Payer: PPO | Admitting: Pharmacist

## 2022-09-09 ENCOUNTER — Other Ambulatory Visit: Payer: PPO

## 2022-09-09 ENCOUNTER — Ambulatory Visit: Payer: PPO | Admitting: Oncology

## 2022-09-09 ENCOUNTER — Inpatient Hospital Stay: Payer: PPO | Attending: Oncology

## 2022-09-09 ENCOUNTER — Ambulatory Visit: Payer: PPO

## 2022-09-09 ENCOUNTER — Encounter: Payer: Self-pay | Admitting: Oncology

## 2022-09-09 VITALS — BP 139/78 | HR 57 | Temp 98.2°F | Resp 16 | Ht 64.0 in | Wt 182.5 lb

## 2022-09-09 DIAGNOSIS — D709 Neutropenia, unspecified: Secondary | ICD-10-CM | POA: Diagnosis not present

## 2022-09-09 DIAGNOSIS — C9000 Multiple myeloma not having achieved remission: Secondary | ICD-10-CM

## 2022-09-09 DIAGNOSIS — D61818 Other pancytopenia: Secondary | ICD-10-CM | POA: Diagnosis not present

## 2022-09-09 DIAGNOSIS — E785 Hyperlipidemia, unspecified: Secondary | ICD-10-CM

## 2022-09-09 DIAGNOSIS — Z79899 Other long term (current) drug therapy: Secondary | ICD-10-CM | POA: Insufficient documentation

## 2022-09-09 LAB — CBC WITH DIFFERENTIAL/PLATELET
Abs Immature Granulocytes: 0.01 10*3/uL (ref 0.00–0.07)
Basophils Absolute: 0 10*3/uL (ref 0.0–0.1)
Basophils Relative: 1 %
Eosinophils Absolute: 0.2 10*3/uL (ref 0.0–0.5)
Eosinophils Relative: 7 %
HCT: 36 % — ABNORMAL LOW (ref 39.0–52.0)
Hemoglobin: 11.6 g/dL — ABNORMAL LOW (ref 13.0–17.0)
Immature Granulocytes: 0 %
Lymphocytes Relative: 31 %
Lymphs Abs: 0.9 10*3/uL (ref 0.7–4.0)
MCH: 31.8 pg (ref 26.0–34.0)
MCHC: 32.2 g/dL (ref 30.0–36.0)
MCV: 98.6 fL (ref 80.0–100.0)
Monocytes Absolute: 0.4 10*3/uL (ref 0.1–1.0)
Monocytes Relative: 14 %
Neutro Abs: 1.4 10*3/uL — ABNORMAL LOW (ref 1.7–7.7)
Neutrophils Relative %: 47 %
Platelets: 67 10*3/uL — ABNORMAL LOW (ref 150–400)
RBC: 3.65 MIL/uL — ABNORMAL LOW (ref 4.22–5.81)
RDW: 16.7 % — ABNORMAL HIGH (ref 11.5–15.5)
WBC: 3 10*3/uL — ABNORMAL LOW (ref 4.0–10.5)
nRBC: 0 % (ref 0.0–0.2)

## 2022-09-09 LAB — COMPREHENSIVE METABOLIC PANEL
ALT: 11 U/L (ref 0–44)
AST: 17 U/L (ref 15–41)
Albumin: 3.5 g/dL (ref 3.5–5.0)
Alkaline Phosphatase: 83 U/L (ref 38–126)
Anion gap: 6 (ref 5–15)
BUN: 17 mg/dL (ref 8–23)
CO2: 26 mmol/L (ref 22–32)
Calcium: 8.5 mg/dL — ABNORMAL LOW (ref 8.9–10.3)
Chloride: 105 mmol/L (ref 98–111)
Creatinine, Ser: 1.16 mg/dL (ref 0.61–1.24)
GFR, Estimated: >60 mL/min (ref >60)
Glucose, Bld: 119 mg/dL — ABNORMAL HIGH (ref 70–99)
Potassium: 4.1 mmol/L (ref 3.5–5.1)
Sodium: 137 mmol/L (ref 135–145)
Total Bilirubin: 0.5 mg/dL (ref 0.3–1.2)
Total Protein: 6.1 g/dL — ABNORMAL LOW (ref 6.5–8.1)

## 2022-09-09 LAB — LIPID PANEL
Cholesterol: 164 mg/dL (ref 0–200)
HDL: 40 mg/dL — ABNORMAL LOW (ref >40)
LDL Cholesterol: 87 mg/dL (ref 0–99)
Total CHOL/HDL Ratio: 4.1 RATIO
Triglycerides: 185 mg/dL — ABNORMAL HIGH (ref <150)
VLDL: 37 mg/dL (ref 0–40)

## 2022-09-09 MED ORDER — DENOSUMAB 120 MG/1.7ML ~~LOC~~ SOLN
120.0000 mg | Freq: Once | SUBCUTANEOUS | Status: AC
  Administered 2022-09-09: 120 mg via SUBCUTANEOUS
  Filled 2022-09-09: qty 1.7

## 2022-09-09 NOTE — Progress Notes (Signed)
[place]  Telephone:([phone] [phone]  ID: Jack [name] OB: 07/03/1933  MR#: [id number]  VZD#:[id number]  Patient Care Team: [name], MD as PCP - General (Family Medicine) End, [name], MD as PCP - Cardiology (Cardiology) [name], MD (Endocrinology) Lloyd Huger, MD as Consulting Physician (Hematology and Oncology)   CHIEF COMPLAINT: Stage II kappa chain myeloma.  INTERVAL HISTORY: Patient returns to clinic today for further evaluation and continuation of Revlimid.  He continues to have back pain, but otherwise feels well.  He has no neurologic complaints.  He denies any recent fevers or illnesses.  He has a good appetite and denies weight loss.  He has no chest pain, shortness of breath, cough, or hemoptysis.  He denies any nausea, vomiting, constipation, or diarrhea.  He has no urinary complaints.  Patient offers no further specific complaints today.  REVIEW OF SYSTEMS:   Review of Systems  Constitutional: Negative.  Negative for fever and malaise/fatigue.  Respiratory: Negative.  Negative for cough, hemoptysis and shortness of breath.   Cardiovascular: Negative.  Negative for chest pain and leg swelling.  Gastrointestinal: Negative.  Negative for abdominal pain.  Genitourinary: Negative.  Negative for dysuria and flank pain.  Musculoskeletal:  Positive for back pain. Negative for falls and joint pain.  Skin: Negative.  Negative for rash.  Neurological: Negative.  Negative for dizziness, focal weakness, weakness and headaches.  Psychiatric/Behavioral: Negative.  The patient is not nervous/anxious.     As per HPI. Otherwise, a complete review of systems is negative.  PAST MEDICAL HISTORY: Past Medical History:  Diagnosis Date   Ascending aortic aneurysm ([place])    a. 12/2020 Echo: Asc Ao 54m, Ao root 469m b. 03/2022 Asc Ao 4.6 cm (4.5 cm in 2019)   CKD (chronic kidney disease), stage III (HCC)    Diastolic  dysfunction    a. 12/2020 Echo: EF 50-55%, no rwma, mild LVH, GrI DD, nl RV fxn, mild AI. Asc Ao 4886mAo root 28m28m Elevated prostate specific antigen (PSA)    has been 7 for a year    GERD (gastroesophageal reflux disease)    History of colon polyps 2008   [facility] of [facility]    Hyperlipidemia    Hypertension    Myeloma (HCC)[place] PAF (paroxysmal atrial fibrillation) (HCC)[place] a. 01/2021-->Eliquis (CHA2DS2VASc = 3-4).   Pain    Prostate hypertrophy    diagnosed at age 40 d32 to hematospermia    PAST SURGICAL HISTORY: Past Surgical History:  Procedure Laterality Date   CATARACT EXTRACTION W/PHACO Left 01/10/2018   Procedure: CATARACT EXTRACTION PHACO AND INTRAOCULAR LENS PLACEMENT (IOC)[place] PorfBirder Robson;  Location: [facility] ORS;  Service: Ophthalmology;  Laterality: Left;  US 0[name]24.8 AP% 14.9 CDE 3.68 Fluid pack lot # [id number] CATARACT EXTRACTION W/PHACO Right 01/25/2018   Procedure: CATARACT EXTRACTION PHACO AND INTRAOCULAR LENS PLACEMENT (IOC);  Surgeon: PorfBirder Robson;  Location: [facility] ORS;  Service: Ophthalmology;  Laterality: Right;  US 0[name]42 AP% 10.8 CDE 4.59 Fluid pack lot # [id number] COLON SURGERY     CYSTOSCOPY W/ URETERAL STENT PLACEMENT Right 10/16/2017   Procedure: right  URETERAL STENT PLACEMENT,cystoscopy bilateral stent removal,rretrograde;  Surgeon: StoiAbbie Sons;  Location: [facility] ORS;  Service: Urology;  Laterality: Right;   CYSTOSCOPY/URETEROSCOPY/HOLMIUM LASER/STENT PLACEMENT Right 12/16/2020   Procedure: CYSTOSCOPY/URETEROSCOPY/HOLMIUM LASER/STENT PLACEMENT;  Surgeon: StoiAbbie Sons;  Location:  [facility] ORS;  Service: Urology;  Laterality: Right;   EXTRACORPOREAL SHOCK WAVE LITHOTRIPSY Right 12/11/2020   Procedure: EXTRACORPOREAL SHOCK WAVE LITHOTRIPSY (ESWL);  Surgeon: Abbie Sons, MD;  Location: [facility] ORS;  Service: Urology;  Laterality: Right;   IR BONE TUMOR(S)RF ABLATION  04/16/2022   IR KYPHO EA ADDL LEVEL  THORACIC OR LUMBAR  02/02/2021   IR KYPHO LUMBAR INC FX REDUCE BONE BX UNI/BIL CANNULATION INC/IMAGING  02/02/2021   IR KYPHO THORACIC WITH BONE BIOPSY  04/15/2022   KIDNEY STONE SURGERY     KYPHOPLASTY N/A 03/12/2021   Procedure: Hewitt Shorts, L3;  Surgeon: Hessie Knows, MD;  Location: [facility] ORS;  Service: Orthopedics;  Laterality: N/A;   RESECTION SOFT TISSUE TUMOR LEG / ANKLE RADICAL  jan 2009   [facility],  right thigh/knee , nonmalignant   SMALL INTESTINE SURGERY  1946   implaed on picket fence, punctured stomach   TONSILLECTOMY      FAMILY HISTORY: Family History  Problem Relation Age of Onset   Hypertension Father    Hyperlipidemia Father    Cancer Sister        thyroid - dx in late 20's    ADVANCED DIRECTIVES (Y/N):  N  HEALTH MAINTENANCE: Social History   Tobacco Use   Smoking status: Former    Types: Cigarettes    Quit date: 07/05/1965    Years since quitting: 57.2   Smokeless tobacco: Never  Vaping Use   Vaping Use: Never used  Substance Use Topics   Alcohol use: Not Currently    Alcohol/week: 1.0 standard drink of alcohol    Types: 1 Standard drinks or equivalent per week    Comment: occassionaly   Drug use: No     Colonoscopy:  PAP:  Bone density:  Lipid panel:  Allergies  Allergen Reactions   Quinolones     Aortic dilation contraindicates FQ   Amlodipine Swelling and Other (See Comments)    LE edema   Azithromycin Nausea And Vomiting   Lipitor [Atorvastatin]     Muscle pain in RT Leg    Lisinopril Cough   Zetia [Ezetimibe]     Pain in legs     Current Outpatient Medications  Medication Sig Dispense Refill   acetaminophen (TYLENOL) 500 MG tablet Take 500-1,000 mg by mouth every 8 (eight) hours as needed for moderate pain.     Ascorbic Acid (VITAMIN C) 1000 MG tablet Take 1,000 mg by mouth in the morning and at bedtime.     Calcium Carb-Cholecalciferol (OYSTER SHELL CALCIUM W/D) 500-5 MG-MCG TABS Take 1 tablet by mouth in the morning and at  bedtime.     carvedilol (COREG) 12.5 MG tablet Take 1 tablet (12.5 mg total) by mouth 2 (two) times daily. 180 tablet 3   dexamethasone (DECADRON) 4 MG tablet TAKE 5 TABLETS BY MOUTH ONE TIME PER WEEK 20 tablet 4   ELIQUIS 5 MG TABS tablet TAKE 1 TABLET BY MOUTH TWICE A DAY 180 tablet 0   feeding supplement (ENSURE ENLIVE / ENSURE PLUS) LIQD Take 237 mLs by mouth 3 (three) times daily between meals. (Patient taking differently: Take 237 mLs by mouth daily with lunch.) 237 mL 12   gabapentin (NEURONTIN) 300 MG capsule Take 2 capsules (600 mg total) by mouth 3 (three) times daily. 180 capsule 2   lenalidomide (REVLIMID) 20 MG capsule Take 1 capsule (20 mg total) by mouth daily. Take for 21 days, then hold for 7 days. Repeat every 28 days 21 capsule  0   lidocaine (XYLOCAINE) 5 % ointment Apply topically 3 (three) times daily as needed for mild pain or moderate pain. 50 g 2   morphine (MS CONTIN) 30 MG 12 hr tablet Take 1 tablet (30 mg total) by mouth every 12 (twelve) hours. 60 tablet 0   Multiple Vitamin (MULTIVITAMIN WITH MINERALS) TABS tablet Take 1 tablet by mouth daily.     Multiple Vitamins-Minerals (PRESERVISION AREDS PO) Take 1 capsule by mouth in the morning and at bedtime.     mupirocin ointment (BACTROBAN) 2 % Apply 1 Application topically 2 (two) times daily. 22 g 0   naloxone (NARCAN) nasal spray 4 mg/0.1 mL SPRAY 1 SPRAY INTO ONE NOSTRIL AS DIRECTED FOR OPIOID OVERDOSE (TURN PERSON ON SIDE AFTER DOSE. IF NO RESPONSE IN 2-3 MINUTES OR PERSON RESPONDS BUT RELAPSES, REPEAT USING A NEW SPRAY DEVICE AND SPRAY INTO THE OTHER NOSTRIL. CALL 911 AFTER USE.) * EMERGENCY USE ONLY * 1 each 0   omeprazole (PRILOSEC) 20 MG capsule Take 1 capsule (20 mg total) by mouth daily. 30 capsule 11   polyethylene glycol (MIRALAX / GLYCOLAX) 17 g packet Take 17 g by mouth 2 (two) times daily. (Patient taking differently: Take 17 g by mouth 2 (two) times daily as needed.)  0   potassium citrate (UROCIT-K) 10 MEQ  (1080 MG) SR tablet TAKE 1 TABLET (10 MEQ TOTAL) BY MOUTH 3 (THREE) TIMES DAILY WITH MEALS. 270 tablet 2   senna-docusate (SENOKOT-S) 8.6-50 MG tablet Take 1 tablet by mouth 2 (two) times daily. 30 tablet 0   No current facility-administered medications for this visit.    OBJECTIVE: Vitals:   09/09/22 0929  BP: 139/78  Pulse: (!) 57  Resp: 16  Temp: 98.2 F (36.8 C)  SpO2: 95%       Body mass index is 31.33 kg/m.    ECOG FS:1 - Symptomatic but completely ambulatory  General: Well-developed, well-nourished, no acute distress. Eyes: Pink conjunctiva, anicteric sclera. HEENT: Normocephalic, moist mucous membranes. Lungs: No audible wheezing or coughing. Heart: Regular rate and rhythm. Abdomen: Soft, nontender, no obvious distention. Musculoskeletal: No edema, cyanosis, or clubbing. Neuro: Alert, answering all questions appropriately. Cranial nerves grossly intact. Skin: No rashes or petechiae noted. Psych: Normal affect.  LAB RESULTS:  Lab Results  Component Value Date   NA 137 09/09/2022   K 4.1 09/09/2022   CL 105 09/09/2022   CO2 26 09/09/2022   GLUCOSE 119 (H) 09/09/2022   BUN 17 09/09/2022   CREATININE 1.16 09/09/2022   CALCIUM 8.5 (L) 09/09/2022   PROT 6.1 (L) 09/09/2022   ALBUMIN 3.5 09/09/2022   AST 17 09/09/2022   ALT 11 09/09/2022   ALKPHOS 83 09/09/2022   BILITOT 0.5 09/09/2022   GFRNONAA >60 09/09/2022   GFRAA 29 (L) 10/16/2017    Lab Results  Component Value Date   WBC 3.0 (L) 09/09/2022   NEUTROABS 1.4 (L) 09/09/2022   HGB 11.6 (L) 09/09/2022   HCT 36.0 (L) 09/09/2022   MCV 98.6 09/09/2022   PLT 67 (L) 09/09/2022     STUDIES: No results found.  ASSESSMENT: Stage II Kappa chain myeloma.  PLAN:    Stage II kappa chain myeloma: (11:14 translocation, high risk) SPEP essentially negative and immunoglobulins are within normal limits.  Patient's kappa light chains have improved to greater than 5400 down to 39.1.  Recently they trended up to  57.0, today's results pending.  Cycle 1 included Velcade and Revlimid, but given his declining performance status  at the time and difficulty to get into clinic this was transitioned to Revlimid only.  Given persistent neutropenia, Revlimid was previously dose reduced to 15 mg daily, but patient is now tolerating an increased dose of 20 mg for 21 days with 7 days off.   He is also taking 20 mg dexamethasone weekly.  PET scan results from December 08, 2021 reviewed independently with no obvious evidence of progressive disease.  Patient initiated monthly Xgeva on August 11, 2021.  Despite persistent pancytopenia, will continue Revlimid as prescribed.  Proceed with Xgeva today.  Return to clinic in 4 weeks for further evaluation and continuation of treatment.  Appreciate clinical pharmacy input.   Anemia: Chronic and.  Patient's hemoglobin is 11.6 today. Right flank mass: MRI results from July 13, 2021 reviewed independently with no obvious pathology on his right flank, but did reveal several fractured ribs.  Continue narcotic regimen as above. Bone lesions: Continue Xgeva as above. Neutropenia: Chronic and unchanged.  Patient's ANC is 1.4 today.  Continue Revlimid 20 mg as above. Thrombocytopenia: Platelets slightly worse today down to 67.  Monitor closely.  Continue Revlimid as above. Pain: Continue current narcotic treatment.  Continue ibuprofen as needed, but patient has been instructed if his platelets fall below 50 this would need to be discontinued.  He was also given a referral back to radiation oncology.  Patient expressed understanding and was in agreement with this plan. He also understands that He can call clinic at any time with any questions, concerns, or complaints.     Cancer Staging  Kappa light chain myeloma ([place]) Staging form: Plasma Cell Myeloma and Plasma Cell Disorders, AJCC 8th Edition - Clinical stage from 04/16/2021: Albumin (g/dL): 3.8, ISS: Stage II, High-risk cytogenetics:  Absent, LDH: Unknown - Signed by Lloyd Huger, MD on 04/16/2021 Albumin range (g/dL): Greater than or equal to 3.5 Cytogenetics: t(11;14) translocation Serum calcium level: Normal Serum creatinine level: Normal Bone disease on imaging: Present  Lloyd Huger, MD   09/09/2022 12:01 PM

## 2022-09-09 NOTE — Progress Notes (Signed)
[place]  [phone] [phone]  Patient Care Team: [name], MD as PCP - General (Family Medicine) End, [name], MD as PCP - Cardiology (Cardiology) [name], MD (Endocrinology) Lloyd Huger, MD as Consulting Physician (Hematology and Oncology)   Name of the patient: Jack [name]  [id number]  1933/07/20   Date of visit: 09/09/22  HPI: Patient is a 88 y.o. male with newly diagnosed multiple myeloma. Currently treated with Revlimid (lenalidomide) and dexamethasone. Patient was initiated on an all oral regimen because he was unable to physically make it into clinic at the time treatment due to his disease/performance status. His status improved he was able to come back to inperson appts on 07/14/21.  Reason for Consult: Oral chemotherapy follow-up for lenalidomide therapy.   PAST MEDICAL HISTORY: Past Medical History:  Diagnosis Date   Ascending aortic aneurysm ([place])    a. 12/2020 Echo: Asc Ao 34m, Ao root 420m b. 03/2022 Asc Ao 4.6 cm (4.5 cm in 2019)   CKD (chronic kidney disease), stage III (HCC)    Diastolic dysfunction    a. 12/2020 Echo: EF 50-55%, no rwma, mild LVH, GrI DD, nl RV fxn, mild AI. Asc Ao 4817mAo root 43m64m Elevated prostate specific antigen (PSA)    has been 7 for a year    GERD (gastroesophageal reflux disease)    History of colon polyps 2008   [facility] of [facility]    Hyperlipidemia    Hypertension    Myeloma (HCC)[place] PAF (paroxysmal atrial fibrillation) (HCC)[place] a. 01/2021-->Eliquis (CHA2DS2VASc = 3-4).   Pain    Prostate hypertrophy    diagnosed at age 88 d40 to hematospermia    HEMATOLOGY/ONCOLOGY HISTORY:  Oncology History  Kappa light chain myeloma (HCC)[place]/30/2022 Initial Diagnosis   Kappa light chain myeloma (HCC)[place]/15/2022 - 04/06/2021 Chemotherapy   Patient is on Treatment Plan : MYELOMA NON-TRANSPLANT CANDIDATES VRd SQ  q21d      04/16/2021 Cancer Staging   Staging form: Plasma Cell Myeloma and Plasma Cell Disorders, AJCC 8th Edition - Clinical stage from 04/16/2021: Albumin (g/dL): 3.8, ISS: Stage II, High-risk cytogenetics: Absent, LDH: Unknown - Signed by FinnLloyd Huger on 04/16/2021 Albumin range (g/dL): Greater than or equal to 3.5 Cytogenetics: t(11;14) translocation Serum calcium level: Normal Serum creatinine level: Normal Bone disease on imaging: Present     ALLERGIES:  is allergic to quinolones, amlodipine, azithromycin, lipitor [atorvastatin], lisinopril, and zetia [ezetimibe].  MEDICATIONS:  Current Outpatient Medications  Medication Sig Dispense Refill   acetaminophen (TYLENOL) 500 MG tablet Take 500-1,000 mg by mouth every 8 (eight) hours as needed for moderate pain.     Ascorbic Acid (VITAMIN C) 1000 MG tablet Take 1,000 mg by mouth in the morning and at bedtime.     Calcium Carb-Cholecalciferol (OYSTER SHELL CALCIUM W/D) 500-5 MG-MCG TABS Take 1 tablet by mouth in the morning and at bedtime.     carvedilol (COREG) 12.5 MG tablet Take 1 tablet (12.5 mg total) by mouth 2 (two) times daily. 180 tablet 3   dexamethasone (DECADRON) 4 MG tablet TAKE 5 TABLETS BY MOUTH ONE TIME PER WEEK 20 tablet 4   ELIQUIS 5 MG TABS tablet TAKE 1 TABLET BY MOUTH TWICE A DAY 180 tablet 0   feeding supplement (ENSURE ENLIVE / ENSURE PLUS) LIQD Take 237 mLs by mouth 3 (three) times daily between meals. (  Patient taking differently: Take 237 mLs by mouth daily with lunch.) 237 mL 12   gabapentin (NEURONTIN) 300 MG capsule Take 2 capsules (600 mg total) by mouth 3 (three) times daily. 180 capsule 2   lenalidomide (REVLIMID) 20 MG capsule Take 1 capsule (20 mg total) by mouth daily. Take for 21 days, then hold for 7 days. Repeat every 28 days 21 capsule 0   lidocaine (XYLOCAINE) 5 % ointment Apply topically 3 (three) times daily as needed for mild pain or moderate pain. 50 g 2   morphine (MS CONTIN) 30 MG 12 hr  tablet Take 1 tablet (30 mg total) by mouth every 12 (twelve) hours. 60 tablet 0   Multiple Vitamin (MULTIVITAMIN WITH MINERALS) TABS tablet Take 1 tablet by mouth daily.     Multiple Vitamins-Minerals (PRESERVISION AREDS PO) Take 1 capsule by mouth in the morning and at bedtime.     mupirocin ointment (BACTROBAN) 2 % Apply 1 Application topically 2 (two) times daily. 22 g 0   naloxone (NARCAN) nasal spray 4 mg/0.1 mL SPRAY 1 SPRAY INTO ONE NOSTRIL AS DIRECTED FOR OPIOID OVERDOSE (TURN PERSON ON SIDE AFTER DOSE. IF NO RESPONSE IN 2-3 MINUTES OR PERSON RESPONDS BUT RELAPSES, REPEAT USING A NEW SPRAY DEVICE AND SPRAY INTO THE OTHER NOSTRIL. CALL 911 AFTER USE.) * EMERGENCY USE ONLY * 1 each 0   omeprazole (PRILOSEC) 20 MG capsule Take 1 capsule (20 mg total) by mouth daily. 30 capsule 11   polyethylene glycol (MIRALAX / GLYCOLAX) 17 g packet Take 17 g by mouth 2 (two) times daily. (Patient taking differently: Take 17 g by mouth 2 (two) times daily as needed.)  0   potassium citrate (UROCIT-K) 10 MEQ (1080 MG) SR tablet TAKE 1 TABLET (10 MEQ TOTAL) BY MOUTH 3 (THREE) TIMES DAILY WITH MEALS. 270 tablet 2   senna-docusate (SENOKOT-S) 8.6-50 MG tablet Take 1 tablet by mouth 2 (two) times daily. 30 tablet 0   No current facility-administered medications for this visit.    VITAL SIGNS: There were no vitals taken for this visit. There were no vitals filed for this visit.  Estimated body mass index is 30.21 kg/m as calculated from the following:   Height as of 08/25/22: _0  (1.626 m).   Weight as of 08/25/22: 79.8 kg (176 lb).  LABS: CBC:    Component Value Date/Time   WBC 3.0 (L) 09/09/2022 0849   HGB 11.6 (L) 09/09/2022 0849   HGB 11.0 (L) 02/24/2021 1014   HCT 36.0 (L) 09/09/2022 0849   HCT 35.3 (L) 02/24/2021 1014   PLT 67 (L) 09/09/2022 0849   PLT 249 02/24/2021 1014   MCV 98.6 09/09/2022 0849   MCV 96 02/24/2021 1014   MCV 90 01/24/2013 0819   NEUTROABS 1.4 (L) 09/09/2022 0849    NEUTROABS 7.4 (H) 01/24/2013 0819   LYMPHSABS 0.9 09/09/2022 0849   LYMPHSABS 1.2 01/24/2013 0819   MONOABS 0.4 09/09/2022 0849   MONOABS 0.3 01/24/2013 0819   EOSABS 0.2 09/09/2022 0849   EOSABS 0.0 01/24/2013 0819   BASOSABS 0.0 09/09/2022 0849   BASOSABS 0.0 01/24/2013 0819   Comprehensive Metabolic Panel:    Component Value Date/Time   NA 136 08/02/2022 1009   NA 136 02/24/2021 1014   NA 138 01/24/2013 0819   K 3.8 08/02/2022 1009   K 3.8 01/24/2013 0819   CL 106 08/02/2022 1009   CL 105 01/24/2013 0819   CO2 24 08/02/2022 1009   CO2 25 01/24/2013  0819   BUN 21 08/02/2022 1009   BUN 23 02/24/2021 1014   BUN 20 (H) 01/24/2013 0819   CREATININE 1.11 08/02/2022 1009   CREATININE 1.52 (H) 01/24/2013 0819   GLUCOSE 113 (H) 08/02/2022 1009   GLUCOSE 171 (H) 01/24/2013 0819   CALCIUM 8.8 (L) 08/02/2022 1009   CALCIUM 10.4 (H) 02/19/2021 1229   AST 29 08/02/2022 1009   AST 18 01/24/2013 0819   ALT 18 08/02/2022 1009   ALT 15 01/24/2013 0819   ALKPHOS 116 08/02/2022 1009   ALKPHOS 74 01/24/2013 0819   BILITOT 0.4 08/02/2022 1009   BILITOT 0.4 02/24/2021 1014   BILITOT 0.5 01/24/2013 0819   PROT 5.7 (L) 08/02/2022 1009   PROT 6.2 02/24/2021 1014   PROT 6.3 (L) 01/24/2013 0819   ALBUMIN 3.2 (L) 08/02/2022 1009   ALBUMIN 4.5 02/24/2021 1014   ALBUMIN 3.6 01/24/2013 0819     Present during today's visit: Patient and [name]  Assessment and Plan: CBC/BMP reviewed with patient and wife, okay to continue with lenalidomide 29m, 21 days on/7off and dexamethasone 241mweekly Pltc decreased, not low enough to indicate hold but will continue to monitor. Of note, patient is taking ibuprofen 20036mid for pain management Patient okay to receive Xgeva today as scheduled   Oral Chemotherapy Side Effect/Intolerance:  Edema: in both feet, patient to try compression stockings No reported N/V, diarrhea, constipation, or fatigue.   Oral Chemotherapy Adherence: no reported  missed doses. Medication calendar updated and extended until the end of March 2024 No patient barriers to medication adherence identified.   New medications: none reported  Medication Access Issues: no issues, fills Revlimid at [facility]  Patient expressed understanding and was in agreement with this plan. He also understands that He can call clinic at any time with any questions, concerns, or complaints.   Follow-up plan: RTC in 4 weeks for lab/MD/pharm/inj  Thank you for allowing me to participate in the care of this very pleasant patient.   Time Total: 15 mins  Visit consisted of counseling and education on dealing with issues of symptom management in the setting of serious and potentially life-threatening illness.Greater than 50%  of this time was spent counseling and coordinating care related to the above assessment and plan.  Signed by: AlyDarl PikesharmD, BCPS, BCOSalley SlaughterPP Hematology/Oncology Clinical Pharmacist Practitioner [place]/DB/AP [phone]/28/2023 9:15 AM

## 2022-09-10 LAB — KAPPA/LAMBDA LIGHT CHAINS
Kappa free light chain: 91.3 mg/L — ABNORMAL HIGH (ref 3.3–19.4)
Kappa, lambda light chain ratio: 5.53 — ABNORMAL HIGH (ref 0.26–1.65)
Lambda free light chains: 16.5 mg/L (ref 5.7–26.3)

## 2022-09-15 ENCOUNTER — Other Ambulatory Visit: Payer: Self-pay

## 2022-09-15 ENCOUNTER — Inpatient Hospital Stay: Payer: PPO | Attending: Oncology | Admitting: Hospice and Palliative Medicine

## 2022-09-15 DIAGNOSIS — C9000 Multiple myeloma not having achieved remission: Secondary | ICD-10-CM

## 2022-09-15 DIAGNOSIS — G893 Neoplasm related pain (acute) (chronic): Secondary | ICD-10-CM | POA: Diagnosis not present

## 2022-09-15 MED ORDER — MORPHINE SULFATE ER 30 MG PO TBCR: 30.0000 mg | EXTENDED_RELEASE_TABLET | Freq: Three times a day (TID) | ORAL | 0 refills | Status: DC

## 2022-09-15 MED ORDER — LENALIDOMIDE 20 MG PO CAPS: 20.0000 mg | ORAL_CAPSULE | Freq: Every day | ORAL | 0 refills | Status: DC

## 2022-09-15 NOTE — Addendum Note (Signed)
Addended by: Altha Harm R on: 09/15/2022 12:08 PM   Modules accepted: Orders

## 2022-09-15 NOTE — Progress Notes (Signed)
Virtual Visit via Telephone Note  I connected with [name] on 09/15/22 at 11:30 AM EST by telephone and verified that I am speaking with the correct person using two identifiers.  Location: Patient: Home Provider: Clinic   I discussed the limitations, risks, security and privacy concerns of performing an evaluation and management service by telephone and the availability of in person appointments. I also discussed with the patient that there may be a patient responsible charge related to this service. The patient expressed understanding and agreed to proceed.   History of Present Illness: Jack [name] is a 89 y.o. male with multiple medical problems including hypertension, CKD stage IIIb, A. fib on Eliquis, and anemia.  Patient was hospitalized 01/02/2021 to 01/07/2021 with T12 compression fracture with recommendation for conservative management including use of a TLSO brace.  Patient was hospitalized again 01/29/2021 to 02/07/2021 with recurrent back pain and MR showing T12 and new L1 compression fracture.  Patient underwent T12/L1 kyphoplasty on 02/02/2021.  There was also suspicion of a left fifth rib fracture, which occurred while transitioning from the stretcher for his kyphoplasty.  Patient had mild hypercalcemia with SPEP and UPEP concerning for possible myeloma.  Patient was referred to [facility] for work-up.  He was readmitted 03/11/2021 - 03/17/2021 with worsening back pain.  CT revealed new compression fractures of L2 and L3.  Patient underwent kyphoplasty on 6/30.  He was hospitalized again 03/26/2021-04/07/2021 with severe back/flank pain.  MRI revealed T10 pathologic compression fracture.  He was started on treatment for multiple myeloma with dexamethasone/Velcade and XRT to spine.   Patient is s/p T11 kyphoplasty and ablation.    Observations/Objective: Spoke with patient and wife by phone.    Patient continues to endorse persistent back pain, likely secondary to known  disease.  He has been referred back to radiation oncology for consideration of further RT.  Patient feels that his medication regimen is not as effective as it was previously.  Currently, he is taking MS Contin 30 mg every 12 hours and several doses daily of ibuprofen.  Patient is also taking intermittent doses of MS IR 15 mg in between MS Contin dosing.    He also endorses lower extremity neuropathy and is taking gabapentin twice daily despite prescribed frequency of 3 times daily.  Assessment and Plan: Chronic back pain secondary to multiple myeloma and history of recurrent/chronic compression fractures.  Will increase MS Contin 30 mg to every 8 hours.  PDMP reviewed.  Neuropathy -May increase gabapentin back to 600 mg 3 times daily but recommended that he delay this change for a week or so after changing MS Contin frequency.  Follow Up Instructions: Follow-up telephone visit 1 to 2 months   I discussed the assessment and treatment plan with the patient. The patient was provided an opportunity to ask questions and all were answered. The patient agreed with the plan and demonstrated an understanding of the instructions.   The patient was advised to call back or seek an in-person evaluation if the symptoms worsen or if the condition fails to improve as anticipated.  I provided 12 minutes of non-face-to-face time during this encounter.   Irean Hong, NP

## 2022-09-20 ENCOUNTER — Encounter: Payer: Self-pay | Admitting: Radiation Oncology

## 2022-09-20 ENCOUNTER — Ambulatory Visit
Admission: RE | Admit: 2022-09-20 | Discharge: 2022-09-20 | Disposition: A | Discharge status: Home / Self Care | Payer: PPO | Source: Ambulatory Visit | Attending: Radiation Oncology | Admitting: Radiation Oncology

## 2022-09-20 VITALS — BP 143/90 | HR 76 | Temp 98.3°F | Resp 16 | Wt 173.6 lb

## 2022-09-20 DIAGNOSIS — C9000 Multiple myeloma not having achieved remission: Secondary | ICD-10-CM | POA: Diagnosis not present

## 2022-09-20 DIAGNOSIS — Z510 Encounter for antineoplastic radiation therapy: Secondary | ICD-10-CM | POA: Diagnosis not present

## 2022-09-20 DIAGNOSIS — C7951 Secondary malignant neoplasm of bone: Secondary | ICD-10-CM | POA: Diagnosis not present

## 2022-09-20 NOTE — Progress Notes (Signed)
Radiation Oncology Follow up Note  Name: Jack [name]   Date:   09/20/2022 MRN:  [id number] DOB: May 05, 1933    This 89 y.o. male presents to the clinic today for evaluation of pain in his right scapula and patient with known multiple myeloma with PET scan 6 months ago showing activity in that region.[name]  REFERRING PROVIDER: [name], MD  HPI: Patient is an 89 year old male now out 1 year having completed palliative radiation therapy.  To thoracic lumbar spine for involved in multiple myeloma.  He has had a good benefit from that.  He is seen today for concern continued pain mostly in the right scapular region.  He still has some back pain although on recent scans those areas are stable.  Recent MRI of his thoracic spine shows post vertebral augmentation for remote compression fractures of T11 and T12 L1-L2 and L3.  Areas of T8 and T9 are poorly characterized without contrast there unenhanced appearance is unchanged.  His PET scan back in March 2023 did show hypermetabolic to be around the right scapula.  COMPLICATIONS OF TREATMENT: none  FOLLOW UP COMPLIANCE: keeps appointments   PHYSICAL EXAM:  BP (!) 143/90 (BP Location: Left Arm, Patient Position: Sitting, Cuff Size: Large)   Pulse 76   Temp 98.3 F (36.8 C)   Resp 16   Wt 173 lb 9.6 oz (78.7 kg)   BMI 29.80 kg/m  Frail-appearing elderly male in NAD.  Deep palpation of the spine does not elicit pain motor and sensory levels in his lower extremities are equal and symmetric he is sensitive to deep palpation around the right scapula.  Well-developed well-nourished patient in NAD. HEENT reveals PERLA, EOMI, discs not visualized.  Oral cavity is clear. No oral mucosal lesions are identified. Neck is clear without evidence of cervical or supraclavicular adenopathy. Lungs are clear to A&P. Cardiac examination is essentially unremarkable with regular rate and rhythm without murmur rub or thrill. Abdomen is benign with no organomegaly or  masses noted. Motor sensory and DTR levels are equal and symmetric in the upper and lower extremities. Cranial nerves II through XII are grossly intact. Proprioception is intact. No peripheral adenopathy or edema is identified. No motor or sensory levels are noted. Crude visual fields are within normal range.  RADIOLOGY RESULTS: PET scan and MRI scans all reviewed compatible with above-stated findings  PLAN: At this time elect to go ahead with palliative radiation therapy to his right scapula will treat 800 cGy in 1 fraction.  Risks and benefits of treatment occluding possible skin reaction were reviewed with the patient and his daughter.  I have personally set up and ordered CT simulation for later this week.  Will port and treat on that 6-day of treatment.  Possible PET CT scan in the future may elicit other areas that may need palliative treatment.  I would like to take this opportunity to thank you for allowing me to participate in the care of your patient.Noreene Filbert, MD

## 2022-09-23 ENCOUNTER — Ambulatory Visit
Admission: RE | Admit: 2022-09-23 | Discharge: 2022-09-23 | Disposition: A | Discharge status: Home / Self Care | Payer: PPO | Source: Ambulatory Visit | Attending: Radiation Oncology | Admitting: Radiation Oncology

## 2022-09-23 DIAGNOSIS — C9000 Multiple myeloma not having achieved remission: Secondary | ICD-10-CM | POA: Diagnosis not present

## 2022-09-23 DIAGNOSIS — C7951 Secondary malignant neoplasm of bone: Secondary | ICD-10-CM | POA: Diagnosis not present

## 2022-09-23 DIAGNOSIS — Z510 Encounter for antineoplastic radiation therapy: Secondary | ICD-10-CM | POA: Diagnosis not present

## 2022-09-28 ENCOUNTER — Other Ambulatory Visit: Payer: Self-pay | Admitting: *Deleted

## 2022-09-28 ENCOUNTER — Encounter: Payer: Self-pay | Admitting: Hospice and Palliative Medicine

## 2022-09-28 MED ORDER — MORPHINE SULFATE ER 30 MG PO TBCR: 30.0000 mg | EXTENDED_RELEASE_TABLET | Freq: Three times a day (TID) | ORAL | 0 refills | Status: DC

## 2022-09-28 NOTE — Telephone Encounter (Signed)
Patient only received a partial fill   Hi Josh, We were refilling Dick's morphine 30 mg for 3 x a day. [facility] only had 35 and said we needed 90.  They asked me to ask you to send in a new prescription for 13 so we could have the full amount.  Let me know if this is the correct way to do this.  thanks. Arcola Jansky

## 2022-09-30 ENCOUNTER — Ambulatory Visit
Admission: RE | Admit: 2022-09-30 | Discharge: 2022-09-30 | Disposition: A | Discharge status: Home / Self Care | Payer: PPO | Source: Ambulatory Visit | Attending: Radiation Oncology | Admitting: Radiation Oncology

## 2022-09-30 ENCOUNTER — Encounter: Payer: Self-pay | Admitting: Oncology

## 2022-09-30 ENCOUNTER — Other Ambulatory Visit: Payer: Self-pay

## 2022-09-30 DIAGNOSIS — Z510 Encounter for antineoplastic radiation therapy: Secondary | ICD-10-CM | POA: Diagnosis not present

## 2022-09-30 DIAGNOSIS — C7951 Secondary malignant neoplasm of bone: Secondary | ICD-10-CM | POA: Diagnosis not present

## 2022-09-30 DIAGNOSIS — C9000 Multiple myeloma not having achieved remission: Secondary | ICD-10-CM | POA: Diagnosis not present

## 2022-09-30 LAB — RAD ONC ARIA SESSION SUMMARY
Course Elapsed Days: 0
Plan Fractions Treated to Date: 1
Plan Prescribed Dose Per Fraction: 8 Gy
Plan Total Fractions Prescribed: 1
Plan Total Prescribed Dose: 8 Gy
Reference Point Dosage Given to Date: 8 Gy
Reference Point Session Dosage Given: 8 Gy
Session Number: 1

## 2022-10-02 DIAGNOSIS — R2689 Other abnormalities of gait and mobility: Secondary | ICD-10-CM | POA: Diagnosis not present

## 2022-10-02 DIAGNOSIS — C9000 Multiple myeloma not having achieved remission: Secondary | ICD-10-CM | POA: Diagnosis not present

## 2022-10-02 DIAGNOSIS — M6281 Muscle weakness (generalized): Secondary | ICD-10-CM | POA: Diagnosis not present

## 2022-10-02 DIAGNOSIS — J9601 Acute respiratory failure with hypoxia: Secondary | ICD-10-CM | POA: Diagnosis not present

## 2022-10-02 DIAGNOSIS — S32592D Other specified fracture of left pubis, subsequent encounter for fracture with routine healing: Secondary | ICD-10-CM | POA: Diagnosis not present

## 2022-10-04 DIAGNOSIS — R062 Wheezing: Secondary | ICD-10-CM | POA: Diagnosis not present

## 2022-10-04 DIAGNOSIS — R059 Cough, unspecified: Secondary | ICD-10-CM | POA: Diagnosis not present

## 2022-10-05 ENCOUNTER — Inpatient Hospital Stay (HOSPITAL_BASED_OUTPATIENT_CLINIC_OR_DEPARTMENT_OTHER): Payer: PPO | Admitting: Hospice and Palliative Medicine

## 2022-10-05 ENCOUNTER — Ambulatory Visit
Admission: RE | Admit: 2022-10-05 | Discharge: 2022-10-05 | Disposition: A | Discharge status: Home / Self Care | Payer: PPO | Source: Ambulatory Visit | Attending: Hospice and Palliative Medicine | Admitting: Hospice and Palliative Medicine

## 2022-10-05 ENCOUNTER — Ambulatory Visit
Admission: RE | Admit: 2022-10-05 | Discharge: 2022-10-05 | Disposition: A | Discharge status: Home / Self Care | Payer: PPO | Attending: Hospice and Palliative Medicine | Admitting: Hospice and Palliative Medicine

## 2022-10-05 DIAGNOSIS — J40 Bronchitis, not specified as acute or chronic: Secondary | ICD-10-CM | POA: Diagnosis not present

## 2022-10-05 DIAGNOSIS — R059 Cough, unspecified: Secondary | ICD-10-CM | POA: Diagnosis not present

## 2022-10-05 DIAGNOSIS — R0602 Shortness of breath: Secondary | ICD-10-CM | POA: Diagnosis not present

## 2022-10-05 MED ORDER — AMOXICILLIN-POT CLAVULANATE 875-125 MG PO TABS: 1.0000 | ORAL_TABLET | Freq: Two times a day (BID) | ORAL | 0 refills | Status: DC

## 2022-10-05 MED ORDER — HYDROCOD POLI-CHLORPHE POLI ER 10-8 MG/5ML PO SUER: 5.0000 mL | Freq: Every evening | ORAL | 0 refills | Status: DC | PRN

## 2022-10-05 MED ORDER — IPRATROPIUM-ALBUTEROL 0.5-2.5 (3) MG/3ML IN SOLN: 3.0000 mL | RESPIRATORY_TRACT | 0 refills | Status: DC | PRN

## 2022-10-05 NOTE — Progress Notes (Signed)
Virtual Visit via Telephone Note  I connected with Jack [name] on 10/05/22 at  2:00 PM EST by telephone and verified that I am speaking with the correct person using two identifiers.  Location: Patient: Home Provider: Clinic   I discussed the limitations, risks, security and privacy concerns of performing an evaluation and management service by telephone and the availability of in person appointments. I also discussed with the patient that there may be a patient responsible charge related to this service. The patient expressed understanding and agreed to proceed.   History of Present Illness: Jack [name] is a 89 y.o. male with multiple medical problems including hypertension, CKD stage IIIb, A. fib on Eliquis, and anemia.  Patient was hospitalized 01/02/2021 to 01/07/2021 with T12 compression fracture with recommendation for conservative management including use of a TLSO brace.  Patient was hospitalized again 01/29/2021 to 02/07/2021 with recurrent back pain and MR showing T12 and new L1 compression fracture.  Patient underwent T12/L1 kyphoplasty on 02/02/2021.  There was also suspicion of a left fifth rib fracture, which occurred while transitioning from the stretcher for his kyphoplasty.  Patient had mild hypercalcemia with SPEP and UPEP concerning for possible myeloma.  Patient was referred to [facility] for work-up.  He was readmitted 03/11/2021 - 03/17/2021 with worsening back pain.  CT revealed new compression fractures of L2 and L3.  Patient underwent kyphoplasty on 6/30.  He was hospitalized again 03/26/2021-04/07/2021 with severe back/flank pain.  MRI revealed T10 pathologic compression fracture.  He was started on treatment for multiple myeloma with dexamethasone/Velcade and XRT to spine.   Patient is s/p T11 kyphoplasty and ablation.    Observations/Objective: Spoke with patient and wife by phone.    Wife reports that patient has had a worsening "croupy" cough for about a week.   No fever or chills.  No shortness of breath.  Occasional wheezing.  Patient received a nebulizer treatment by his home health nurse yesterday with some improvement in symptoms.  Coughing is keeping him awake at night.  No other symptomatic complaints.  Per wife, patient was negative with home COVID test.  Assessment and Plan: Bronchitis -will send for chest x-ray.  Start Augmentin empirically x 10 days.  Will send Rx for Tussionex.  Start DuoNebs.  Recommended continuing Revlimid for now unless patient develops fever.  Case and plan discussed with Dr. Grayland Ormond.  Follow Up Instructions: Follow-up telephone visit 1 to 2 months   I discussed the assessment and treatment plan with the patient. The patient was provided an opportunity to ask questions and all were answered. The patient agreed with the plan and demonstrated an understanding of the instructions.   The patient was advised to call back or seek an in-person evaluation if the symptoms worsen or if the condition fails to improve as anticipated.  I provided 10 minutes of non-face-to-face time during this encounter.   Irean Hong, NP

## 2022-10-06 ENCOUNTER — Other Ambulatory Visit: Payer: Self-pay | Admitting: *Deleted

## 2022-10-06 DIAGNOSIS — J9601 Acute respiratory failure with hypoxia: Secondary | ICD-10-CM | POA: Diagnosis not present

## 2022-10-06 DIAGNOSIS — C9000 Multiple myeloma not having achieved remission: Secondary | ICD-10-CM

## 2022-10-06 DIAGNOSIS — S32592D Other specified fracture of left pubis, subsequent encounter for fracture with routine healing: Secondary | ICD-10-CM | POA: Diagnosis not present

## 2022-10-06 DIAGNOSIS — M6281 Muscle weakness (generalized): Secondary | ICD-10-CM | POA: Diagnosis not present

## 2022-10-06 DIAGNOSIS — R2689 Other abnormalities of gait and mobility: Secondary | ICD-10-CM | POA: Diagnosis not present

## 2022-10-07 ENCOUNTER — Inpatient Hospital Stay: Payer: PPO

## 2022-10-07 ENCOUNTER — Inpatient Hospital Stay: Payer: PPO | Admitting: Pharmacist

## 2022-10-07 ENCOUNTER — Encounter: Payer: Self-pay | Admitting: Oncology

## 2022-10-07 ENCOUNTER — Inpatient Hospital Stay (HOSPITAL_BASED_OUTPATIENT_CLINIC_OR_DEPARTMENT_OTHER): Payer: PPO | Admitting: Oncology

## 2022-10-07 VITALS — BP 130/74 | HR 64 | Temp 97.6°F | Resp 17 | Wt 168.9 lb

## 2022-10-07 DIAGNOSIS — C9000 Multiple myeloma not having achieved remission: Secondary | ICD-10-CM

## 2022-10-07 DIAGNOSIS — R059 Cough, unspecified: Secondary | ICD-10-CM

## 2022-10-07 LAB — CBC WITH DIFFERENTIAL/PLATELET
Abs Immature Granulocytes: 0.03 10*3/uL (ref 0.00–0.07)
Basophils Absolute: 0 10*3/uL (ref 0.0–0.1)
Basophils Relative: 1 %
Eosinophils Absolute: 0.1 10*3/uL (ref 0.0–0.5)
Eosinophils Relative: 4 %
HCT: 37.5 % — ABNORMAL LOW (ref 39.0–52.0)
Hemoglobin: 11.4 g/dL — ABNORMAL LOW (ref 13.0–17.0)
Immature Granulocytes: 1 %
Lymphocytes Relative: 26 %
Lymphs Abs: 0.8 10*3/uL (ref 0.7–4.0)
MCH: 31.3 pg (ref 26.0–34.0)
MCHC: 30.4 g/dL (ref 30.0–36.0)
MCV: 103 fL — ABNORMAL HIGH (ref 80.0–100.0)
Monocytes Absolute: 0.3 10*3/uL (ref 0.1–1.0)
Monocytes Relative: 11 %
Neutro Abs: 1.7 10*3/uL (ref 1.7–7.7)
Neutrophils Relative %: 57 %
Platelets: 79 10*3/uL — ABNORMAL LOW (ref 150–400)
RBC: 3.64 MIL/uL — ABNORMAL LOW (ref 4.22–5.81)
RDW: 16.9 % — ABNORMAL HIGH (ref 11.5–15.5)
WBC: 2.9 10*3/uL — ABNORMAL LOW (ref 4.0–10.5)
nRBC: 0 % (ref 0.0–0.2)

## 2022-10-07 LAB — COMPREHENSIVE METABOLIC PANEL
ALT: 150 U/L — ABNORMAL HIGH (ref 0–44)
AST: 193 U/L — ABNORMAL HIGH (ref 15–41)
Albumin: 3.2 g/dL — ABNORMAL LOW (ref 3.5–5.0)
Alkaline Phosphatase: 97 U/L (ref 38–126)
Anion gap: 9 (ref 5–15)
BUN: 20 mg/dL (ref 8–23)
CO2: 23 mmol/L (ref 22–32)
Calcium: 8.5 mg/dL — ABNORMAL LOW (ref 8.9–10.3)
Chloride: 105 mmol/L (ref 98–111)
Creatinine, Ser: 1.19 mg/dL (ref 0.61–1.24)
GFR, Estimated: 59 mL/min — ABNORMAL LOW (ref >60)
Glucose, Bld: 147 mg/dL — ABNORMAL HIGH (ref 70–99)
Potassium: 4.1 mmol/L (ref 3.5–5.1)
Sodium: 137 mmol/L (ref 135–145)
Total Bilirubin: 0.6 mg/dL (ref 0.3–1.2)
Total Protein: 6.8 g/dL (ref 6.5–8.1)

## 2022-10-07 MED ORDER — LENALIDOMIDE 20 MG PO CAPS: 20.0000 mg | ORAL_CAPSULE | Freq: Every day | ORAL | 0 refills | Status: DC

## 2022-10-07 MED ORDER — DENOSUMAB 120 MG/1.7ML ~~LOC~~ SOLN
120.0000 mg | Freq: Once | SUBCUTANEOUS | Status: AC
  Administered 2022-10-07: 120 mg via SUBCUTANEOUS
  Filled 2022-10-07: qty 1.7

## 2022-10-07 NOTE — Progress Notes (Signed)
Patient has had a really bad cough and cold this last week.

## 2022-10-07 NOTE — Progress Notes (Signed)
[place]  Telephone:([phone]) [phone] Fax:(336) [phone]  ID: [name] OB: [birth year]  MR#: [id number]  TGY#:[id number]  Patient Care Team: [name], MD as PCP - General (Family Medicine) End, [name], MD as PCP - Cardiology (Cardiology) [name], MD (Endocrinology) Lloyd Huger, MD as Consulting Physician (Hematology and Oncology)   CHIEF COMPLAINT: Stage II kappa chain myeloma.  INTERVAL HISTORY: Patient returns to clinic today for further evaluation and continuation of Xgeva and Revlimid.  He has had significant bout of bronchitis this past week and is currently on antibiotics.  He otherwise feels well. He has no neurologic complaints.  He denies any fevers.  He has a good appetite and denies weight loss.  He has no chest pain, shortness of breath, or hemoptysis.  He denies any nausea, vomiting, constipation, or diarrhea.  He has no urinary complaints.  Patient offers no further specific complaints today.  REVIEW OF SYSTEMS:   Review of Systems  Constitutional: Negative.  Negative for fever and malaise/fatigue.  Respiratory:  Positive for cough. Negative for hemoptysis and shortness of breath.   Cardiovascular: Negative.  Negative for chest pain and leg swelling.  Gastrointestinal: Negative.  Negative for abdominal pain.  Genitourinary: Negative.  Negative for dysuria and flank pain.  Musculoskeletal:  Positive for back pain. Negative for falls and joint pain.  Skin: Negative.  Negative for rash.  Neurological: Negative.  Negative for dizziness, focal weakness, weakness and headaches.  Psychiatric/Behavioral: Negative.  The patient is not nervous/anxious.     As per HPI. Otherwise, a complete review of systems is negative.  PAST MEDICAL HISTORY: Past Medical History:  Diagnosis Date   Ascending aortic aneurysm ([place])    a. 12/2020 Echo: Asc Ao 63m, Ao root 424m b. 03/2022 Asc Ao 4.6 cm (4.5 cm in 2019)   CKD (chronic kidney  disease), stage III (HCC)    Diastolic dysfunction    a. 12/2020 Echo: EF 50-55%, no rwma, mild LVH, GrI DD, nl RV fxn, mild AI. Asc Ao 4865mAo root 70m73m Elevated prostate specific antigen (PSA)    has been 7 for a year    GERD (gastroesophageal reflux disease)    [facility] polyps [facility] of kidney stones    Hyperlipidemia    Hypertension    Myeloma (HCC)[address] PAF (paroxysmal atrial fibrillation) (HCC)[place] a. 01/2021-->Eliquis (CHA2DS2VASc = 3-4).   Pain    Prostate hypertrophy    diagnosed at age 52 d20 to hematospermia    PAST SURGICAL HISTORY: Past Surgical History:  Procedure Laterality Date   CATARACT EXTRACTION W/PHACO Left 01/10/2018   Procedure: CATARACT EXTRACTION PHACO AND INTRAOCULAR LENS PLACEMENT (IOC)[place]geon: PorfBirder Robson;  Location: [facility] ORS;  Service: Ophthalmology;  Laterality: Left;  US 0[name]24.8 AP% 14.9 CDE 3.68 Fluid pack lot # [id number] CATARACT EXTRACTION W/PHACO Right 01/25/2018   Procedure: CATARACT EXTRACTION PHACO AND INTRAOCULAR LENS PLACEMENT (IOC);  Surgeon: PorfBirder Robson;  Location: [facility] ORS;  Service: Ophthalmology;  Laterality: Right;  US 0[name]42 AP% 10.8 CDE 4.59 Fluid pack lot # [id number] COLON SURGERY     CYSTOSCOPY W/ URETERAL STENT PLACEMENT Right 10/16/2017   Procedure: right  URETERAL STENT PLACEMENT,cystoscopy bilateral stent removal,rretrograde;  Surgeon: StoiAbbie Sons;  Location: [facility] ORS;  Service: Urology;  Laterality: Right;   CYSTOSCOPY/URETEROSCOPY/HOLMIUM LASER/STENT PLACEMENT Right 12/16/2020   Procedure: CYSTOSCOPY/URETEROSCOPY/HOLMIUM LASER/STENT PLACEMENT;  Surgeon: Abbie Sons, MD;  Location: [facility] ORS;  Service: Urology;  Laterality: Right;   EXTRACORPOREAL SHOCK WAVE LITHOTRIPSY Right 12/11/2020   Procedure: EXTRACORPOREAL SHOCK WAVE LITHOTRIPSY (ESWL);  Surgeon: Abbie Sons, MD;  Location: [facility] ORS;  Service: Urology;  Laterality: Right;   IR BONE TUMOR(S)RF  ABLATION  04/16/2022   IR KYPHO EA ADDL LEVEL THORACIC OR LUMBAR  02/02/2021   IR KYPHO LUMBAR INC FX REDUCE BONE BX UNI/BIL CANNULATION INC/IMAGING  02/02/2021   IR KYPHO THORACIC WITH BONE BIOPSY  04/15/2022   KIDNEY STONE SURGERY     KYPHOPLASTY N/A 03/12/2021   Procedure: Hewitt Shorts, L3;  Surgeon: Hessie Knows, MD;  Location: [facility] ORS;  Service: Orthopedics;  Laterality: N/A;   RESECTION SOFT TISSUE TUMOR LEG / ANKLE RADICAL  jan 2009   [facility],  right thigh/knee , nonmalignant   SMALL INTESTINE SURGERY  1946   implaed on picket fence, punctured stomach   TONSILLECTOMY      FAMILY HISTORY: Family History  Problem Relation Age of Onset   Hypertension Father    Hyperlipidemia Father    Cancer Sister        thyroid - dx in late 20's    ADVANCED DIRECTIVES (Y/N):  N  HEALTH MAINTENANCE: Social History   Tobacco Use   Smoking status: Former    Types: Cigarettes    Quit date: 07/05/1965    Years since quitting: 57.2   Smokeless tobacco: Never  Vaping Use   Vaping Use: Never used  Substance Use Topics   Alcohol use: Not Currently    Alcohol/week: 1.0 standard drink of alcohol    Types: 1 Standard drinks or equivalent per week    Comment: occassionaly   Drug use: No     Colonoscopy:  PAP:  Bone density:  Lipid panel:  Allergies  Allergen Reactions   Quinolones     Aortic dilation contraindicates FQ   Amlodipine Swelling and Other (See Comments)    LE edema   Azithromycin Nausea And Vomiting   Lipitor [Atorvastatin]     Muscle pain in RT Leg    Lisinopril Cough   Zetia [Ezetimibe]     Pain in legs     Current Outpatient Medications  Medication Sig Dispense Refill   acetaminophen (TYLENOL) 500 MG tablet Take 500-1,000 mg by mouth every 8 (eight) hours as needed for moderate pain.     albuterol (VENTOLIN HFA) 108 (90 Base) MCG/ACT inhaler Inhale into the lungs.     amoxicillin-clavulanate (AUGMENTIN) 875-125 MG tablet Take 1 tablet by mouth 2 (two) times daily.  20 tablet 0   Ascorbic Acid (VITAMIN C) 1000 MG tablet Take 1,000 mg by mouth in the morning and at bedtime.     Calcium Carb-Cholecalciferol (OYSTER SHELL CALCIUM W/D) 500-5 MG-MCG TABS Take 1 tablet by mouth in the morning and at bedtime.     carvedilol (COREG) 12.5 MG tablet Take 1 tablet (12.5 mg total) by mouth 2 (two) times daily. 180 tablet 3   chlorpheniramine-HYDROcodone (TUSSIONEX) 10-8 MG/5ML Take 5 mLs by mouth at bedtime as needed for cough. 70 mL 0   dexamethasone (DECADRON) 4 MG tablet TAKE 5 TABLETS BY MOUTH ONE TIME PER WEEK 20 tablet 4   dextromethorphan-guaiFENesin (MUCINEX DM) 30-600 MG 12hr tablet Take 1 tablet by mouth 2 (two) times daily.     ELIQUIS 5 MG TABS tablet TAKE 1 TABLET BY MOUTH TWICE A DAY 180 tablet 0   feeding supplement (ENSURE ENLIVE /  ENSURE PLUS) LIQD Take 237 mLs by mouth 3 (three) times daily between meals. (Patient taking differently: Take 237 mLs by mouth daily with lunch.) 237 mL 12   gabapentin (NEURONTIN) 300 MG capsule Take 2 capsules (600 mg total) by mouth 3 (three) times daily. 180 capsule 2   guaifenesin (ROBITUSSIN) 100 MG/5ML syrup Take 200 mg by mouth 3 (three) times daily as needed for cough.     ipratropium-albuterol (DUONEB) 0.5-2.5 (3) MG/3ML SOLN Take 3 mLs by nebulization every 4 (four) hours as needed. 360 mL 0   lidocaine (XYLOCAINE) 5 % ointment Apply topically 3 (three) times daily as needed for mild pain or moderate pain. 50 g 2   morphine (MS CONTIN) 30 MG 12 hr tablet Take 1 tablet (30 mg total) by mouth every 8 (eight) hours. 13 tablet 0   Multiple Vitamin (MULTIVITAMIN WITH MINERALS) TABS tablet Take 1 tablet by mouth daily.     Multiple Vitamins-Minerals (PRESERVISION AREDS PO) Take 1 capsule by mouth in the morning and at bedtime.     mupirocin ointment (BACTROBAN) 2 % Apply 1 Application topically 2 (two) times daily. 22 g 0   naloxone (NARCAN) nasal spray 4 mg/0.1 mL SPRAY 1 SPRAY INTO ONE NOSTRIL AS DIRECTED FOR OPIOID  OVERDOSE (TURN PERSON ON SIDE AFTER DOSE. IF NO RESPONSE IN 2-3 MINUTES OR PERSON RESPONDS BUT RELAPSES, REPEAT USING A NEW SPRAY DEVICE AND SPRAY INTO THE OTHER NOSTRIL. CALL 911 AFTER USE.) * EMERGENCY USE ONLY * 1 each 0   omeprazole (PRILOSEC) 20 MG capsule Take 1 capsule (20 mg total) by mouth daily. 30 capsule 11   polyethylene glycol (MIRALAX / GLYCOLAX) 17 g packet Take 17 g by mouth 2 (two) times daily. (Patient taking differently: Take 17 g by mouth 2 (two) times daily as needed.)  0   potassium citrate (UROCIT-K) 10 MEQ (1080 MG) SR tablet TAKE 1 TABLET (10 MEQ TOTAL) BY MOUTH 3 (THREE) TIMES DAILY WITH MEALS. 270 tablet 2   senna-docusate (SENOKOT-S) 8.6-50 MG tablet Take 1 tablet by mouth 2 (two) times daily. 30 tablet 0   guaiFENesin (MUCINEX PO) Take by mouth as needed.     lenalidomide (REVLIMID) 20 MG capsule Take 1 capsule (20 mg total) by mouth daily. Take for 21 days, then hold for 7 days. Repeat every 28 days 21 capsule 0   No current facility-administered medications for this visit.    OBJECTIVE: Vitals:   10/07/22 1013  BP: 130/74  Pulse: 64  Resp: 17  Temp: 97.6 F (36.4 C)  SpO2: 94%       Body mass index is 28.99 kg/m.    ECOG FS:1 - Symptomatic but completely ambulatory  General: Well-developed, well-nourished, no acute distress. Eyes: Pink conjunctiva, anicteric sclera. HEENT: Normocephalic, moist mucous membranes. Lungs: No audible wheezing or coughing. Heart: Regular rate and rhythm. Abdomen: Soft, nontender, no obvious distention. Musculoskeletal: No edema, cyanosis, or clubbing. Neuro: Alert, answering all questions appropriately. Cranial nerves grossly intact. Skin: No rashes or petechiae noted. Psych: Normal affect.  LAB RESULTS:  Lab Results  Component Value Date   NA 137 10/07/2022   K 4.1 10/07/2022   CL 105 10/07/2022   CO2 23 10/07/2022   GLUCOSE 147 (H) 10/07/2022   BUN 20 10/07/2022   CREATININE 1.19 10/07/2022   CALCIUM 8.5 (L)  10/07/2022   PROT 6.8 10/07/2022   ALBUMIN 3.2 (L) 10/07/2022   AST 193 (H) 10/07/2022   ALT 150 (H) 10/07/2022  ALKPHOS 97 10/07/2022   BILITOT 0.6 10/07/2022   GFRNONAA 59 (L) 10/07/2022   GFRAA 29 (L) 10/16/2017    Lab Results  Component Value Date   WBC 2.9 (L) 10/07/2022   NEUTROABS 1.7 10/07/2022   HGB 11.4 (L) 10/07/2022   HCT 37.5 (L) 10/07/2022   MCV 103.0 (H) 10/07/2022   PLT 79 (L) 10/07/2022     STUDIES: DG Chest 2 View  Result Date: 10/05/2022 CLINICAL DATA:  shortness of breath EXAM: CHEST - 2 VIEW COMPARISON:  06/10/2022 FINDINGS: Cardiac silhouette is unremarkable. No pneumothorax or pleural effusion. The lungs are clear. Aorta tortuous, calcified and ectatic. Osseous structures are osteopenic. Multilevel lower thoracic and lumbar spine vertebroplasties noted with compression deformities. IMPRESSION: No acute cardiopulmonary process. Electronically Signed   By: Sammie Bench M.D.   On: 10/05/2022 13:59    ASSESSMENT: Stage II Kappa chain myeloma.  PLAN:    Stage II kappa chain myeloma: (11:14 translocation, high risk) SPEP essentially negative and immunoglobulins are within normal limits.  Patient's kappa light chains have improved to greater than 5400 down to 39.1.  Recently they trended up to 57.0, today's results pending.  Cycle 1 included Velcade and Revlimid, but given his declining performance status at the time and difficulty to get into clinic this was transitioned to Revlimid only.  Given persistent neutropenia, Revlimid was previously dose reduced to 15 mg daily, but patient is now tolerating an increased dose of 20 mg for 21 days with 7 days off.   He is also taking 20 mg dexamethasone weekly.  PET scan results from December 08, 2021 reviewed independently with no obvious evidence of progressive disease.  Patient initiated monthly Xgeva on August 11, 2021.  Despite persistent pancytopenia, will continue Revlimid as prescribed.  Proceed with Xgeva today.   Return to clinic in 1 week for laboratory work and then in 2 weeks for further evaluation and continuation of treatment.  Appreciate clinical pharmacy input.   Anemia: Chronic and unchanged.  Patient's hemoglobin is 11.4 today.   Right flank mass: MRI results from July 13, 2021 reviewed independently with no obvious pathology on his right flank, but did reveal several fractured ribs.  Continue narcotic regimen as above. Bone lesions: Continue Xgeva as above. Neutropenia: Mildly improved.  Patient's ANC is 1.7 today.  Continue Revlimid 20 mg as above. Thrombocytopenia: Mildly improved.  Patient's platelet count is 7 9 today.  Monitor closely.  Continue Revlimid as above. Pain: Continue current narcotic treatment.  Continue ibuprofen as needed, but patient has been instructed if his platelets fall below 50 this would need to be discontinued.  He was also given a referral back to radiation oncology. Transaminitis: Unclear etiology.  Possibly related to underlying viral illness.  Repeat labs in 1 week and follow-up in 2 weeks as above.  Patient expressed understanding and was in agreement with this plan. He also understands that He can call clinic at any time with any questions, concerns, or complaints.     Cancer Staging  Kappa light chain myeloma ([place]) Staging form: Plasma Cell Myeloma and Plasma Cell Disorders, AJCC 8th Edition - Clinical stage from 04/16/2021: Albumin (g/dL): 3.8, ISS: Stage II, High-risk cytogenetics: Absent, LDH: Unknown - Signed by Lloyd Huger, MD on 04/16/2021 Albumin range (g/dL): Greater than or equal to 3.5 Cytogenetics: t(11;14) translocation Serum calcium level: Normal Serum creatinine level: Normal Bone disease on imaging: Present  Lloyd Huger, MD   10/07/2022 11:22 AM

## 2022-10-07 NOTE — Progress Notes (Signed)
[place]  Telephone:([phone] [phone]  Patient Care Team: [name], MD as PCP - General (Family Medicine) End, [name], MD as PCP - Cardiology (Cardiology) [name], MD (Endocrinology) Lloyd Huger, MD as Consulting Physician (Hematology and Oncology)   Name of the patient: Jack [name]  [id number]  [birth year]   Date of visit: 10/07/22  HPI: Patient is a 89 y.o. male with newly diagnosed multiple myeloma. Currently treated with Revlimid (lenalidomide) and dexamethasone. Patient was initiated on an all oral regimen because he was unable to physically make it into clinic at the time treatment due to his disease/performance status. His status improved he was able to come back to inperson appts on 07/14/21.  Reason for Consult: Oral chemotherapy follow-up for lenalidomide therapy.   PAST MEDICAL HISTORY: Past Medical History:  Diagnosis Date   Ascending aortic aneurysm ([place])    a. 12/2020 Echo: Asc Ao 32m, Ao root 444m b. 03/2022 Asc Ao 4.6 cm (4.5 cm in 2019)   CKD (chronic kidney disease), stage III (HCC)    Diastolic dysfunction    a. 12/2020 Echo: EF 50-55%, no rwma, mild LVH, GrI DD, nl RV fxn, mild AI. Asc Ao 4857mAo root 58m45m Elevated prostate specific antigen (PSA)    has been 7 for a year    GERD (gastroesophageal reflux disease)    History of colon polyps 2008   [facility] of kidney stones    Hyperlipidemia    Hypertension    Myeloma (HCC)[place] PAF (paroxysmal atrial fibrillation) (HCC)[place] a. 01/2021-->Eliquis (CHA2DS2VASc = 3-4).   Pain    Prostate hypertrophy    diagnosed at age 31 d58 to hematospermia    HEMATOLOGY/ONCOLOGY HISTORY:  Oncology History  Kappa light chain myeloma (HCC)[place]/30/2022 Initial Diagnosis   Kappa light chain myeloma (HCC)[place]/15/2022 - 04/06/2021 Chemotherapy   Patient is on Treatment Plan : MYELOMA NON-TRANSPLANT CANDIDATES VRd SQ  q21d      04/16/2021 Cancer Staging   Staging form: Plasma Cell Myeloma and Plasma Cell Disorders, AJCC 8th Edition - Clinical stage from 04/16/2021: Albumin (g/dL): 3.8, ISS: Stage II, High-risk cytogenetics: Absent, LDH: Unknown - Signed by FinnLloyd Huger on 04/16/2021 Albumin range (g/dL): Greater than or equal to 3.5 Cytogenetics: t(11;14) translocation Serum calcium level: Normal Serum creatinine level: Normal Bone disease on imaging: Present     ALLERGIES:  is allergic to quinolones, amlodipine, azithromycin, lipitor [atorvastatin], lisinopril, and zetia [ezetimibe].  MEDICATIONS:  Current Outpatient Medications  Medication Sig Dispense Refill   acetaminophen (TYLENOL) 500 MG tablet Take 500-1,000 mg by mouth every 8 (eight) hours as needed for moderate pain.     albuterol (VENTOLIN HFA) 108 (90 Base) MCG/ACT inhaler Inhale into the lungs.     amoxicillin-clavulanate (AUGMENTIN) 875-125 MG tablet Take 1 tablet by mouth 2 (two) times daily. 20 tablet 0   Ascorbic Acid (VITAMIN C) 1000 MG tablet Take 1,000 mg by mouth in the morning and at bedtime.     Calcium Carb-Cholecalciferol (OYSTER SHELL CALCIUM W/D) 500-5 MG-MCG TABS Take 1 tablet by mouth in the morning and at bedtime.     carvedilol (COREG) 12.5 MG tablet Take 1 tablet (12.5 mg total) by mouth 2 (two) times daily. 180 tablet 3   chlorpheniramine-HYDROcodone (TUSSIONEX) 10-8 MG/5ML Take 5 mLs by mouth at bedtime as needed for cough. 70 mL 0   dexamethasone (  DECADRON) 4 MG tablet TAKE 5 TABLETS BY MOUTH ONE TIME PER WEEK 20 tablet 4   dextromethorphan-guaiFENesin (MUCINEX DM) 30-600 MG 12hr tablet Take 1 tablet by mouth 2 (two) times daily.     ELIQUIS 5 MG TABS tablet TAKE 1 TABLET BY MOUTH TWICE A DAY 180 tablet 0   feeding supplement (ENSURE ENLIVE / ENSURE PLUS) LIQD Take 237 mLs by mouth 3 (three) times daily between meals. (Patient taking differently: Take 237 mLs by mouth daily with lunch.) 237 mL 12   gabapentin  (NEURONTIN) 300 MG capsule Take 2 capsules (600 mg total) by mouth 3 (three) times daily. 180 capsule 2   guaifenesin (ROBITUSSIN) 100 MG/5ML syrup Take 200 mg by mouth 3 (three) times daily as needed for cough.     ipratropium-albuterol (DUONEB) 0.5-2.5 (3) MG/3ML SOLN Take 3 mLs by nebulization every 4 (four) hours as needed. 360 mL 0   lenalidomide (REVLIMID) 20 MG capsule Take 1 capsule (20 mg total) by mouth daily. Take for 21 days, then hold for 7 days. Repeat every 28 days 21 capsule 0   lidocaine (XYLOCAINE) 5 % ointment Apply topically 3 (three) times daily as needed for mild pain or moderate pain. 50 g 2   morphine (MS CONTIN) 30 MG 12 hr tablet Take 1 tablet (30 mg total) by mouth every 8 (eight) hours. 13 tablet 0   Multiple Vitamin (MULTIVITAMIN WITH MINERALS) TABS tablet Take 1 tablet by mouth daily.     Multiple Vitamins-Minerals (PRESERVISION AREDS PO) Take 1 capsule by mouth in the morning and at bedtime.     mupirocin ointment (BACTROBAN) 2 % Apply 1 Application topically 2 (two) times daily. 22 g 0   naloxone (NARCAN) nasal spray 4 mg/0.1 mL SPRAY 1 SPRAY INTO ONE NOSTRIL AS DIRECTED FOR OPIOID OVERDOSE (TURN PERSON ON SIDE AFTER DOSE. IF NO RESPONSE IN 2-3 MINUTES OR PERSON RESPONDS BUT RELAPSES, REPEAT USING A NEW SPRAY DEVICE AND SPRAY INTO THE OTHER NOSTRIL. CALL 911 AFTER USE.) * EMERGENCY USE ONLY * 1 each 0   omeprazole (PRILOSEC) 20 MG capsule Take 1 capsule (20 mg total) by mouth daily. 30 capsule 11   polyethylene glycol (MIRALAX / GLYCOLAX) 17 g packet Take 17 g by mouth 2 (two) times daily. (Patient taking differently: Take 17 g by mouth 2 (two) times daily as needed.)  0   potassium citrate (UROCIT-K) 10 MEQ (1080 MG) SR tablet TAKE 1 TABLET (10 MEQ TOTAL) BY MOUTH 3 (THREE) TIMES DAILY WITH MEALS. 270 tablet 2   senna-docusate (SENOKOT-S) 8.6-50 MG tablet Take 1 tablet by mouth 2 (two) times daily. 30 tablet 0   No current facility-administered medications for this  visit.   Facility-Administered Medications Ordered in Other Visits  Medication Dose Route Frequency Provider Last Rate Last Admin   denosumab (XGEVA) injection 120 mg  120 mg Subcutaneous Once Lloyd Huger, MD        VITAL SIGNS: There were no vitals taken for this visit. There were no vitals filed for this visit.  Estimated body mass index is 28.99 kg/m as calculated from the following:   Height as of 09/09/22: '5\' 4"'$  (1.626 m).   Weight as of an earlier encounter on 10/07/22: 76.6 kg (168 lb 14.4 oz).  LABS: CBC:    Component Value Date/Time   WBC 2.9 (L) 10/07/2022 0937   HGB 11.4 (L) 10/07/2022 0937   HGB 11.0 (L) 02/24/2021 1014   HCT 37.5 (L) 10/07/2022 6203  HCT 35.3 (L) 02/24/2021 1014   PLT 79 (L) 10/07/2022 0937   PLT 249 02/24/2021 1014   MCV 103.0 (H) 10/07/2022 0937   MCV 96 02/24/2021 1014   MCV 90 01/24/2013 0819   NEUTROABS 1.7 10/07/2022 0937   NEUTROABS 7.4 (H) 01/24/2013 0819   LYMPHSABS 0.8 10/07/2022 0937   LYMPHSABS 1.2 01/24/2013 0819   MONOABS 0.3 10/07/2022 0937   MONOABS 0.3 01/24/2013 0819   EOSABS 0.1 10/07/2022 0937   EOSABS 0.0 01/24/2013 0819   BASOSABS 0.0 10/07/2022 0937   BASOSABS 0.0 01/24/2013 0819   Comprehensive Metabolic Panel:    Component Value Date/Time   NA 137 10/07/2022 0937   NA 136 02/24/2021 1014   NA 138 01/24/2013 0819   K 4.1 10/07/2022 0937   K 3.8 01/24/2013 0819   CL 105 10/07/2022 0937   CL 105 01/24/2013 0819   CO2 23 10/07/2022 0937   CO2 25 01/24/2013 0819   BUN 20 10/07/2022 0937   BUN 23 02/24/2021 1014   BUN 20 (H) 01/24/2013 0819   CREATININE 1.19 10/07/2022 0937   CREATININE 1.52 (H) 01/24/2013 0819   GLUCOSE 147 (H) 10/07/2022 0937   GLUCOSE 171 (H) 01/24/2013 0819   CALCIUM 8.5 (L) 10/07/2022 0937   CALCIUM 10.4 (H) 02/19/2021 1229   AST 193 (H) 10/07/2022 0937   AST 18 01/24/2013 0819   ALT 150 (H) 10/07/2022 0937   ALT 15 01/24/2013 0819   ALKPHOS 97 10/07/2022 0937   ALKPHOS 74  01/24/2013 0819   BILITOT 0.6 10/07/2022 0937   BILITOT 0.4 02/24/2021 1014   BILITOT 0.5 01/24/2013 0819   PROT 6.8 10/07/2022 0937   PROT 6.2 02/24/2021 1014   PROT 6.3 (L) 01/24/2013 0819   ALBUMIN 3.2 (L) 10/07/2022 0937   ALBUMIN 4.5 02/24/2021 1014   ALBUMIN 3.6 01/24/2013 0819     Present during today's visit: Patient and [name]  Assessment and Plan: CBC/CMP reviewed with patient and wife, okay to continue with lenalidomide '20mg'$ , 21 days on/7off and dexamethasone '20mg'$  weekly Plts have improved AST/ALT elevated on today's lab work, could be related to current bronchitis. Reviewed medications for potential causes. Did not see any causative medications. Plan to have patient come back in for lab recheck in 1 wk to monitor AST/ALT. Of note, TBili within normal limits.  Patient okay to receive Xgeva today as scheduled   Oral Chemotherapy Side Effect/Intolerance:  Edema has improved with use of compression stockings Diarrhea - pt reports loose stools likely from recent start of antibiotic  Oral Chemotherapy Adherence: no reported missed doses.  No patient barriers to medication adherence identified.   New medications: reviewed newly prescribed medications and added patient's current OTC products to med list  Medication Access Issues: no issues, fills Revlimid at [facility]  Patient expressed understanding and was in agreement with this plan. He also understands that He can call clinic at any time with any questions, concerns, or complaints.   Follow-up plan: RTC in 1 week for labs and 2 weeks for lab/MD/pharm/inj  Thank you for allowing me to participate in the care of this very pleasant patient.   Time Total: 15 mins  Visit consisted of counseling and education on dealing with issues of symptom management in the setting of serious and potentially life-threatening illness.Greater than 50%  of this time was spent counseling and coordinating care related to the above  assessment and plan.  Signed by: Darl Pikes, PharmD, BCPS, BCOP, CPP Hematology/Oncology  Clinical Pharmacist Practitioner /DB/AP Oral Chemotherapy Navigation Clinic [phone]  10/07/2022 11:00 AM

## 2022-10-08 LAB — KAPPA/LAMBDA LIGHT CHAINS
Kappa free light chain: 134.9 mg/L — ABNORMAL HIGH (ref 3.3–19.4)
Kappa, lambda light chain ratio: 5.48 — ABNORMAL HIGH (ref 0.26–1.65)
Lambda free light chains: 24.6 mg/L (ref 5.7–26.3)

## 2022-10-09 LAB — IMMUNOGLOBULINS A/E/G/M, SERUM
IgA: 233 mg/dL (ref 61–437)
IgE (Immunoglobulin E), Serum: <2 IU/mL — ABNORMAL LOW (ref 6–495)
IgG (Immunoglobin G), Serum: 617 mg/dL (ref 603–1613)
IgM (Immunoglobulin M), Srm: 26 mg/dL (ref 15–143)

## 2022-10-13 ENCOUNTER — Inpatient Hospital Stay: Payer: PPO

## 2022-10-13 DIAGNOSIS — C9000 Multiple myeloma not having achieved remission: Secondary | ICD-10-CM | POA: Diagnosis not present

## 2022-10-13 LAB — CBC WITH DIFFERENTIAL/PLATELET
Abs Immature Granulocytes: 0.02 10*3/uL (ref 0.00–0.07)
Basophils Absolute: 0 10*3/uL (ref 0.0–0.1)
Basophils Relative: 1 %
Eosinophils Absolute: 0.2 10*3/uL (ref 0.0–0.5)
Eosinophils Relative: 6 %
HCT: 36.7 % — ABNORMAL LOW (ref 39.0–52.0)
Hemoglobin: 11.5 g/dL — ABNORMAL LOW (ref 13.0–17.0)
Immature Granulocytes: 1 %
Lymphocytes Relative: 30 %
Lymphs Abs: 1 10*3/uL (ref 0.7–4.0)
MCH: 30.7 pg (ref 26.0–34.0)
MCHC: 31.3 g/dL (ref 30.0–36.0)
MCV: 97.9 fL (ref 80.0–100.0)
Monocytes Absolute: 0.4 10*3/uL (ref 0.1–1.0)
Monocytes Relative: 14 %
Neutro Abs: 1.6 10*3/uL — ABNORMAL LOW (ref 1.7–7.7)
Neutrophils Relative %: 48 %
Platelets: 94 10*3/uL — ABNORMAL LOW (ref 150–400)
RBC: 3.75 MIL/uL — ABNORMAL LOW (ref 4.22–5.81)
RDW: 16.6 % — ABNORMAL HIGH (ref 11.5–15.5)
WBC: 3.2 10*3/uL — ABNORMAL LOW (ref 4.0–10.5)
nRBC: 0 % (ref 0.0–0.2)

## 2022-10-13 LAB — COMPREHENSIVE METABOLIC PANEL
ALT: 70 U/L — ABNORMAL HIGH (ref 0–44)
AST: 49 U/L — ABNORMAL HIGH (ref 15–41)
Albumin: 3.1 g/dL — ABNORMAL LOW (ref 3.5–5.0)
Alkaline Phosphatase: 111 U/L (ref 38–126)
Anion gap: 9 (ref 5–15)
BUN: 19 mg/dL (ref 8–23)
CO2: 25 mmol/L (ref 22–32)
Calcium: 8.5 mg/dL — ABNORMAL LOW (ref 8.9–10.3)
Chloride: 104 mmol/L (ref 98–111)
Creatinine, Ser: 1.06 mg/dL (ref 0.61–1.24)
GFR, Estimated: >60 mL/min (ref >60)
Glucose, Bld: 87 mg/dL (ref 70–99)
Potassium: 4 mmol/L (ref 3.5–5.1)
Sodium: 138 mmol/L (ref 135–145)
Total Bilirubin: 0.3 mg/dL (ref 0.3–1.2)
Total Protein: 6.1 g/dL — ABNORMAL LOW (ref 6.5–8.1)

## 2022-10-14 ENCOUNTER — Other Ambulatory Visit: Payer: PPO

## 2022-10-14 LAB — KAPPA/LAMBDA LIGHT CHAINS
Kappa free light chain: 118.5 mg/L — ABNORMAL HIGH (ref 3.3–19.4)
Kappa, lambda light chain ratio: 5.51 — ABNORMAL HIGH (ref 0.26–1.65)
Lambda free light chains: 21.5 mg/L (ref 5.7–26.3)

## 2022-10-21 ENCOUNTER — Ambulatory Visit: Payer: PPO | Admitting: Oncology

## 2022-10-21 ENCOUNTER — Other Ambulatory Visit: Payer: PPO

## 2022-10-21 ENCOUNTER — Ambulatory Visit: Payer: PPO | Admitting: Pharmacist

## 2022-10-22 ENCOUNTER — Ambulatory Visit: Payer: PPO | Admitting: Pharmacist

## 2022-10-22 ENCOUNTER — Ambulatory Visit: Payer: PPO | Admitting: Oncology

## 2022-10-22 ENCOUNTER — Other Ambulatory Visit: Payer: PPO

## 2022-10-25 ENCOUNTER — Other Ambulatory Visit: Payer: Self-pay | Admitting: Pharmacist

## 2022-10-25 ENCOUNTER — Encounter: Payer: Self-pay | Admitting: Oncology

## 2022-10-25 MED ORDER — MORPHINE SULFATE ER 30 MG PO TBCR: 30.0000 mg | EXTENDED_RELEASE_TABLET | Freq: Three times a day (TID) | ORAL | 0 refills | Status: DC

## 2022-10-26 ENCOUNTER — Inpatient Hospital Stay: Payer: PPO | Admitting: Pharmacist

## 2022-10-26 ENCOUNTER — Inpatient Hospital Stay: Payer: PPO | Admitting: Oncology

## 2022-10-26 ENCOUNTER — Inpatient Hospital Stay: Payer: PPO

## 2022-10-29 ENCOUNTER — Other Ambulatory Visit: Payer: Self-pay | Admitting: Oncology

## 2022-10-29 DIAGNOSIS — C9000 Multiple myeloma not having achieved remission: Secondary | ICD-10-CM

## 2022-11-01 ENCOUNTER — Emergency Department
Admission: EM | Admit: 2022-11-01 | Discharge: 2022-11-01 | Disposition: A | Discharge status: Home / Self Care | Payer: PPO | Attending: Student in an Organized Health Care Education/Training Program | Admitting: Student in an Organized Health Care Education/Training Program

## 2022-11-01 ENCOUNTER — Inpatient Hospital Stay: Payer: PPO | Attending: Oncology | Admitting: Nurse Practitioner

## 2022-11-01 ENCOUNTER — Other Ambulatory Visit: Payer: Self-pay

## 2022-11-01 ENCOUNTER — Emergency Department: Payer: PPO

## 2022-11-01 ENCOUNTER — Telehealth: Payer: Self-pay | Admitting: *Deleted

## 2022-11-01 ENCOUNTER — Ambulatory Visit: Payer: PPO | Admitting: Pharmacist

## 2022-11-01 ENCOUNTER — Ambulatory Visit: Payer: PPO | Admitting: Oncology

## 2022-11-01 ENCOUNTER — Encounter: Payer: Self-pay | Admitting: Nurse Practitioner

## 2022-11-01 ENCOUNTER — Other Ambulatory Visit: Payer: PPO

## 2022-11-01 DIAGNOSIS — R051 Acute cough: Secondary | ICD-10-CM | POA: Diagnosis not present

## 2022-11-01 DIAGNOSIS — C9000 Multiple myeloma not having achieved remission: Secondary | ICD-10-CM | POA: Diagnosis not present

## 2022-11-01 DIAGNOSIS — U071 COVID-19: Secondary | ICD-10-CM | POA: Diagnosis not present

## 2022-11-01 DIAGNOSIS — R0902 Hypoxemia: Secondary | ICD-10-CM

## 2022-11-01 DIAGNOSIS — R058 Other specified cough: Secondary | ICD-10-CM | POA: Diagnosis present

## 2022-11-01 DIAGNOSIS — R059 Cough, unspecified: Secondary | ICD-10-CM | POA: Diagnosis not present

## 2022-11-01 LAB — COMPREHENSIVE METABOLIC PANEL
ALT: 15 U/L (ref 0–44)
AST: 24 U/L (ref 15–41)
Albumin: 3.3 g/dL — ABNORMAL LOW (ref 3.5–5.0)
Alkaline Phosphatase: 88 U/L (ref 38–126)
Anion gap: 10 (ref 5–15)
BUN: 27 mg/dL — ABNORMAL HIGH (ref 8–23)
CO2: 25 mmol/L (ref 22–32)
Calcium: 9 mg/dL (ref 8.9–10.3)
Chloride: 102 mmol/L (ref 98–111)
Creatinine, Ser: 1.38 mg/dL — ABNORMAL HIGH (ref 0.61–1.24)
GFR, Estimated: 49 mL/min — ABNORMAL LOW (ref >60)
Glucose, Bld: 129 mg/dL — ABNORMAL HIGH (ref 70–99)
Potassium: 4.3 mmol/L (ref 3.5–5.1)
Sodium: 137 mmol/L (ref 135–145)
Total Bilirubin: 0.9 mg/dL (ref 0.3–1.2)
Total Protein: 6.5 g/dL (ref 6.5–8.1)

## 2022-11-01 LAB — CBC WITH DIFFERENTIAL/PLATELET
Abs Immature Granulocytes: 0.03 10*3/uL (ref 0.00–0.07)
Basophils Absolute: 0 10*3/uL (ref 0.0–0.1)
Basophils Relative: 1 %
Eosinophils Absolute: 0.1 10*3/uL (ref 0.0–0.5)
Eosinophils Relative: 2 %
HCT: 38.7 % — ABNORMAL LOW (ref 39.0–52.0)
Hemoglobin: 11.8 g/dL — ABNORMAL LOW (ref 13.0–17.0)
Immature Granulocytes: 1 %
Lymphocytes Relative: 15 %
Lymphs Abs: 0.8 10*3/uL (ref 0.7–4.0)
MCH: 31.1 pg (ref 26.0–34.0)
MCHC: 30.5 g/dL (ref 30.0–36.0)
MCV: 102.1 fL — ABNORMAL HIGH (ref 80.0–100.0)
Monocytes Absolute: 0.8 10*3/uL (ref 0.1–1.0)
Monocytes Relative: 14 %
Neutro Abs: 3.8 10*3/uL (ref 1.7–7.7)
Neutrophils Relative %: 67 %
Platelets: 72 10*3/uL — ABNORMAL LOW (ref 150–400)
RBC: 3.79 MIL/uL — ABNORMAL LOW (ref 4.22–5.81)
RDW: 17.4 % — ABNORMAL HIGH (ref 11.5–15.5)
WBC: 5.7 10*3/uL (ref 4.0–10.5)
nRBC: 0 % (ref 0.0–0.2)

## 2022-11-01 LAB — RESP PANEL BY RT-PCR (RSV, FLU A&B, COVID)  RVPGX2
Influenza A by PCR: NEGATIVE
Influenza B by PCR: NEGATIVE
Resp Syncytial Virus by PCR: NEGATIVE
SARS Coronavirus 2 by RT PCR: POSITIVE — AB

## 2022-11-01 LAB — LACTIC ACID, PLASMA: Lactic Acid, Venous: 1.4 mmol/L (ref 0.5–1.9)

## 2022-11-01 MED ORDER — DOXYCYCLINE HYCLATE 100 MG PO TABS: 100.0000 mg | ORAL_TABLET | Freq: Two times a day (BID) | ORAL | 0 refills | Status: AC

## 2022-11-01 MED ORDER — IPRATROPIUM-ALBUTEROL 0.5-2.5 (3) MG/3ML IN SOLN
3.0000 mL | Freq: Once | RESPIRATORY_TRACT | Status: AC
  Administered 2022-11-01: 3 mL via RESPIRATORY_TRACT
  Filled 2022-11-01: qty 3

## 2022-11-01 NOTE — ED Notes (Signed)
Patient denies pain and is resting comfortably.  

## 2022-11-01 NOTE — Progress Notes (Signed)
Virtual Visit Progress Note  Symptom Management [place] [facility] [place]. [facility] [address] [place], [place] [address] [phone] (phone) [phone] (fax)  I connected with [name] on 11/01/22 at 10:00 AM EST by video enabled telemedicine visit and verified that I am speaking with the correct person using two identifiers.   I discussed the limitations, risks, security and privacy concerns of performing an evaluation and management service by telemedicine and the availability of in-person appointments. I also discussed with the patient that there may be a patient responsible charge related to this service. The patient expressed understanding and agreed to proceed.   Other persons participating in the visit and their role in the encounter: wife   Patient's location: home  Provider's location: clinic   Chief Complaint: covid positive   Patient Care Team: [name], MD as PCP - General (Family Medicine) End, [name], MD as PCP - Cardiology (Cardiology) [name], MD (Endocrinology) Lloyd Huger, MD as Consulting Physician (Hematology and Oncology)   Name of the patient: Jack [name]  PD:[id number]  01/19/1933   Date of visit: 11/01/22  Diagnosis- Kappa light chain myeloma  Chief complaint/ Reason for visit- covid positive & worsening symptoms  Heme/Onc history:  Oncology History  Kappa light chain myeloma ([place])  03/12/2021 Initial Diagnosis   Kappa light chain myeloma ([place])   03/27/2021 - 04/06/2021 Chemotherapy   Patient is on Treatment Plan : MYELOMA NON-TRANSPLANT CANDIDATES VRd SQ q21d      04/16/2021 Cancer Staging   Staging form: Plasma Cell Myeloma and Plasma Cell Disorders, AJCC 8th Edition - Clinical stage from 04/16/2021: Albumin (g/dL): 3.8, ISS: Stage II, High-risk cytogenetics: Absent, LDH: Unknown - Signed by Lloyd Huger, MD on  04/16/2021 Albumin range (g/dL): Greater than or equal to 3.5 Cytogenetics: t(11;14) translocation Serum calcium level: Normal Serum creatinine level: Normal Bone disease on imaging: Present     Interval history- Patient is 89 year old male diagnosed with stage II plasma cell/kappa light chain myeloma, currently on revlimid-dexamethasone-velcade with xgeva. He received RVD for cycle one but performance status worsenened and elected to to proceed with revlimid-dex only. He has persistent neutropenia and revlimid has been dose reduced. Kappa free light chain has been rising over past year but Kappa, lambda light chain ratio has been elevated but roughly stable.   Today he requests evaluation via telemedicine for complaints of worsening cough, lethargy. He had similar symptoms in January but symptoms improved and then recurred. We reviewed timeline of recent illness as below:   1/22- Developed cough with sputum. had negative covid test. Saw home health/[facility]. They provided breathing treatment and evaluation was recommended.  1/23- saw Billey Chang, NP who started him on augmentin, tussionex, and duonebs. Revlimid was continued. He improved clinically.  1/31- completed revlimid & dex cycle.  2/5- Spouse developed coughing and lethargy.  2/8- Patient started next cycle, day 1 of revlimid & dex.  2/10- Patient and wife both tested positive for covid. Patient developed cough & feeling poorly.  2/14- wife negative test. Patient persistently positive. Symptoms stable.  2/15- Patient's cough worsened 2/19- patient still testing positive. Continues revlimid. Denies fevers. Temp this morning was 99.1. BP 124/65. SpO2 92%. He's feeling poorly. Quite fatigued. Requires his wife to perform adls which is change from his baseline. Is taking mucinex severe congestion & cough and tussin DM. No chest pain. Becomes short of  breath with exertion.   Review of systems- Review of Systems  Constitutional:  Positive  for malaise/fatigue. Negative for chills, diaphoresis, fever and weight loss.  HENT:  Negative for congestion, ear discharge, ear pain, nosebleeds, sinus pain and sore throat.   Eyes:  Negative for pain and redness.  Respiratory:  Positive for cough, sputum production and shortness of breath. Negative for hemoptysis, wheezing and stridor.   Cardiovascular:  Negative for chest pain, palpitations and leg swelling.  Gastrointestinal:  Negative for abdominal pain, constipation, diarrhea, nausea and vomiting.  Genitourinary:  Negative for dysuria.  Musculoskeletal:  Negative for falls, joint pain and myalgias.  Skin:  Negative for rash.  Neurological:  Positive for weakness. Negative for dizziness, sensory change, loss of consciousness and headaches.  Psychiatric/Behavioral:  Negative for depression. The patient is not nervous/anxious and does not have insomnia.   All other systems reviewed and are negative.     Allergies  Allergen Reactions   Quinolones     Aortic dilation contraindicates FQ   Amlodipine Swelling and Other (See Comments)    LE edema   Azithromycin Nausea And Vomiting   Lipitor [Atorvastatin]     Muscle pain in RT Leg    Lisinopril Cough   Zetia [Ezetimibe]     Pain in legs     Past Medical History:  Diagnosis Date   Ascending aortic aneurysm ([place])    a. 12/2020 Echo: Asc Ao 79m, Ao root 441m b. 03/2022 Asc Ao 4.6 cm (4.5 cm in 2019)   CKD (chronic kidney disease), stage III (HCC)    Diastolic dysfunction    a. 12/2020 Echo: EF 50-55%, no rwma, mild LVH, GrI DD, nl RV fxn, mild AI. Asc Ao 4860mAo root 30m25m Elevated prostate specific antigen (PSA)    has been 7 for a year    GERD (gastroesophageal reflux disease)    History of colon polyps 2008   [facility] History of kidney stones    Hyperlipidemia    Hypertension    Myeloma (HCC)[place] PAF (paroxysmal atrial fibrillation) (HCC)[place] a. 01/2021-->Eliquis (CHA2DS2VASc = 3-4).   Pain    Prostate  hypertrophy    diagnosed at age 33 d75 to hematospermia    Past Surgical History:  Procedure Laterality Date   CATARACT EXTRACTION W/PHACO Left 01/10/2018   Procedure: CATARACT EXTRACTION PHACO AND INTRAOCULAR LENS PLACEMENT (IOC)[place] Porf[place];  Location: [facility] OR[facility];  Service: Ophthalmology;  Laterality: Left;  US 0[name]24.8 AP% 14.9 CDE 3.68 Fluid pack lot # [id number] CATARACT EXTRACTION W/PHACO Right 01/25/2018   Procedure: CATARACT EXTRACTION PHACO AND INTRAOCULAR LENS PLACEMENT (IOC);  Surgeon: PorfBirder Robson;  Location: [facility] ORS;  Service: Ophthalmology;  Laterality: Right;  US 0[name]42 AP% 10.8 CDE 4.59 Fluid pack lot # [id number] COLON SURGERY     CYSTOSCOPY W/ URETERAL STENT PLACEMENT Right 10/16/2017   Procedure: right  URETERAL STENT PLACEMENT,cystoscopy bilateral stent removal,rretrograde;  Surgeon: StoiAbbie Sons;  Location: [facility] ORS;  Service: Urology;  Laterality: Right;   CYSTOSCOPY/URETEROSCOPY/HOLMIUM LASER/STENT PLACEMENT Right 12/16/2020   Procedure: CYSTOSCOPY/URETEROSCOPY/HOLMIUM LASER/STENT PLACEMENT;  Surgeon: StoiAbbie Sons;  Location: [facility] ORS;  Service: Urology;  Laterality: Right;   EXTRACORPOREAL SHOCK WAVE LITHOTRIPSY Right 12/11/2020   Procedure: EXTRACORPOREAL SHOCK WAVE LITHOTRIPSY (ESWL);  Surgeon: StoiAbbie Sons;  Location: [facility] ORS;  Service: Urology;  Laterality: Right;   IR BONE TUMOR(S)RF ABLATION  04/16/2022   IR KYPHO EA ADDL LEVEL THORACIC OR LUMBAR  02/02/2021   IR KYPHO LUMBAR INC FX REDUCE BONE BX UNI/BIL CANNULATION INC/IMAGING  02/02/2021   IR KYPHO THORACIC WITH BONE BIOPSY  04/15/2022   KIDNEY STONE SURGERY     KYPHOPLASTY N/A 03/12/2021   Procedure: Hewitt Shorts, L3;  Surgeon: Hessie Knows, MD;  Location: [facility] ORS;  Service: Orthopedics;  Laterality: N/A;   RESECTION SOFT TISSUE TUMOR LEG / ANKLE RADICAL  jan 2009   [facility],  right thigh/knee , nonmalignant   SMALL INTESTINE SURGERY  1946   implaed on picket  fence, punctured stomach   TONSILLECTOMY      Social History   Socioeconomic History   Marital status: Married    Spouse name: Geologist, engineering   Number of children: 4   Years of education: 16   Highest education level: Not on file  Occupational History   Occupation: Retired    Fish farm manager: retired    Comment: Real Estate   Tobacco Use   Smoking status: Former    Types: Cigarettes    Quit date: 07/05/1965    Years since quitting: 57.3   Smokeless tobacco: Never  Vaping Use   Vaping Use: Never used  Substance and Sexual Activity   Alcohol use: Not Currently    Alcohol/week: 1.0 standard drink of alcohol    Types: 1 Standard drinks or equivalent per week    Comment: occassionaly   Drug use: No   Sexual activity: Yes    Birth control/protection: None  Other Topics Concern   Not on file  Social History Narrative   Not on file   Social Determinants of Health   Financial Resource Strain: Not on file  Food Insecurity: No Food Insecurity (06/11/2022)   Hunger Vital Sign    Worried About Running Out of Food in the Last Year: Never true    Ran Out of Food in the Last Year: Never true  Transportation Needs: No Transportation Needs (06/11/2022)   PRAPARE - Hydrologist (Medical): No    Lack of Transportation (Non-Medical): No  Physical Activity: Not on file  Stress: Not on file  Social Connections: Not on file  Intimate Partner Violence: Not At Risk (06/11/2022)   Humiliation, Afraid, Rape, and Kick questionnaire    Fear of Current or Ex-Partner: No    Emotionally Abused: No    Physically Abused: No    Sexually Abused: No    Family History  Problem Relation Age of Onset   Hypertension Father    Hyperlipidemia Father    Cancer Sister        thyroid - dx in late 20's     Current Outpatient Medications:    albuterol (VENTOLIN HFA) 108 (90 Base) MCG/ACT inhaler, Inhale into the lungs., Disp: , Rfl:    Ascorbic Acid (VITAMIN C) 1000 MG tablet, Take  1,000 mg by mouth in the morning and at bedtime., Disp: , Rfl:    Calcium Carb-Cholecalciferol (OYSTER SHELL CALCIUM W/D) 500-5 MG-MCG TABS, Take 1 tablet by mouth in the morning and at bedtime., Disp: , Rfl:    carvedilol (COREG) 12.5 MG tablet, Take 1 tablet (12.5 mg total) by mouth 2 (two) times daily., Disp: 180 tablet, Rfl: 3   chlorpheniramine-HYDROcodone (TUSSIONEX) 10-8 MG/5ML, Take 5 mLs by mouth at bedtime as needed for cough., Disp: 70 mL, Rfl: 0   dexamethasone (DECADRON) 4 MG tablet, TAKE 5 TABLETS BY MOUTH ONE TIME PER  WEEK, Disp: 20 tablet, Rfl: 4   dextromethorphan-guaiFENesin (MUCINEX DM) 30-600 MG 12hr tablet, Take 1 tablet by mouth 2 (two) times daily., Disp: , Rfl:    ELIQUIS 5 MG TABS tablet, TAKE 1 TABLET BY MOUTH TWICE A DAY, Disp: 180 tablet, Rfl: 0   feeding supplement (ENSURE ENLIVE / ENSURE PLUS) LIQD, Take 237 mLs by mouth 3 (three) times daily between meals. (Patient taking differently: Take 237 mLs by mouth daily with lunch.), Disp: 237 mL, Rfl: 12   gabapentin (NEURONTIN) 300 MG capsule, Take 2 capsules (600 mg total) by mouth 3 (three) times daily., Disp: 180 capsule, Rfl: 2   guaiFENesin (MUCINEX PO), Take by mouth as needed., Disp: , Rfl:    guaifenesin (ROBITUSSIN) 100 MG/5ML syrup, Take 200 mg by mouth 3 (three) times daily as needed for cough., Disp: , Rfl:    lenalidomide (REVLIMID) 20 MG capsule, Take 1 capsule (20 mg total) by mouth daily. Take for 21 days, then hold for 7 days. Repeat every 28 days, Disp: 21 capsule, Rfl: 0   morphine (MS CONTIN) 30 MG 12 hr tablet, Take 1 tablet (30 mg total) by mouth every 8 (eight) hours., Disp: 90 tablet, Rfl: 0   Multiple Vitamin (MULTIVITAMIN WITH MINERALS) TABS tablet, Take 1 tablet by mouth daily., Disp: , Rfl:    Multiple Vitamins-Minerals (PRESERVISION AREDS PO), Take 1 capsule by mouth in the morning and at bedtime., Disp: , Rfl:    polyethylene glycol (MIRALAX / GLYCOLAX) 17 g packet, Take 17 g by mouth 2 (two)  times daily., Disp: , Rfl: 0   potassium citrate (UROCIT-K) 10 MEQ (1080 MG) SR tablet, TAKE 1 TABLET (10 MEQ TOTAL) BY MOUTH 3 (THREE) TIMES DAILY WITH MEALS., Disp: 270 tablet, Rfl: 2   senna-docusate (SENOKOT-S) 8.6-50 MG tablet, Take 1 tablet by mouth 2 (two) times daily., Disp: 30 tablet, Rfl: 0   acetaminophen (TYLENOL) 500 MG tablet, Take 500-1,000 mg by mouth every 8 (eight) hours as needed for moderate pain. (Patient not taking: Reported on 11/01/2022), Disp: , Rfl:    ipratropium-albuterol (DUONEB) 0.5-2.5 (3) MG/3ML SOLN, Take 3 mLs by nebulization every 4 (four) hours as needed. (Patient not taking: Reported on 11/01/2022), Disp: 360 mL, Rfl: 0   lidocaine (XYLOCAINE) 5 % ointment, Apply topically 3 (three) times daily as needed for mild pain or moderate pain. (Patient not taking: Reported on 11/01/2022), Disp: 50 g, Rfl: 2   mupirocin ointment (BACTROBAN) 2 %, Apply 1 Application topically 2 (two) times daily. (Patient not taking: Reported on 11/01/2022), Disp: 22 g, Rfl: 0   naloxone (NARCAN) nasal spray 4 mg/0.1 mL, SPRAY 1 SPRAY INTO ONE NOSTRIL AS DIRECTED FOR OPIOID OVERDOSE (TURN PERSON ON SIDE AFTER DOSE. IF NO RESPONSE IN 2-3 MINUTES OR PERSON RESPONDS BUT RELAPSES, REPEAT USING A NEW SPRAY DEVICE AND SPRAY INTO THE OTHER NOSTRIL. CALL 911 AFTER USE.) * EMERGENCY USE ONLY * (Patient not taking: Reported on 11/01/2022), Disp: 1 each, Rfl: 0   omeprazole (PRILOSEC) 20 MG capsule, Take 1 capsule (20 mg total) by mouth daily. (Patient not taking: Reported on 11/01/2022), Disp: 30 capsule, Rfl: 11  Physical exam: Exam limited due to telemedicine There were no vitals filed for this visit. Physical Exam Constitutional:      Appearance: He is ill-appearing.     Comments: In recliner. Fatigued appearing. Accompanied by wife.   Pulmonary:     Effort: No respiratory distress.     Comments: Strong, productive cough heard frequently  Skin:    Coloration: Skin is not pale.  Neurological:      Mental Status: He is alert and oriented to person, place, and time.  Psychiatric:        Mood and Affect: Mood normal.         Latest Ref Rng & Units 10/13/2022    2:31 PM  CMP  Glucose 70 - 99 mg/dL 87   BUN 8 - 23 mg/dL 19   Creatinine 0.61 - 1.24 mg/dL 1.06   Sodium 135 - 145 mmol/L 138   Potassium 3.5 - 5.1 mmol/L 4.0   Chloride 98 - 111 mmol/L 104   CO2 22 - 32 mmol/L 25   Calcium 8.9 - 10.3 mg/dL 8.5   Total Protein 6.5 - 8.1 g/dL 6.1   Total Bilirubin 0.3 - 1.2 mg/dL 0.3   Alkaline Phos 38 - 126 U/L 111   AST 15 - 41 U/L 49   ALT 0 - 44 U/L 70       Latest Ref Rng & Units 10/13/2022    2:31 PM  CBC  WBC 4.0 - 10.5 K/uL 3.2   Hemoglobin 13.0 - 17.0 g/dL 11.5   Hematocrit 39.0 - 52.0 % 36.7   Platelets 150 - 400 K/uL 94    DG Chest 2 View  Result Date: 10/05/2022 CLINICAL DATA:  shortness of breath EXAM: CHEST - 2 VIEW COMPARISON:  06/10/2022 FINDINGS: Cardiac silhouette is unremarkable. No pneumothorax or pleural effusion. The lungs are clear. Aorta tortuous, calcified and ectatic. Osseous structures are osteopenic. Multilevel lower thoracic and lumbar spine vertebroplasties noted with compression deformities. IMPRESSION: No acute cardiopulmonary process. Electronically Signed   By: Sammie Bench M.D.   On: 10/05/2022 13:59    Assessment and plan- Patient is a 89 y.o. male diagnosed with myeloma who is evaluated via telemedicine for covid infection with worsening symptoms.    1) Covid-19 Virus Infection - patient is outside of use of antivirals but certainly would have qualified.  - He has new hypoxia. Baseline O2 readings in clinic have been around 95%+. Today at home 83%-90%. Given worsening of his clinical symptoms and oxygen readings I have concern for post viral pneumonia. Additionally, patient has multiple risk factors for severe illness. I've recommended evaluation in ER and possible hospitalization. Discussed with Dr. Grayland Ormond who agrees.   2) Kappa light  chain myeloma- I've asked patient to hold revlimid and dex for now. Once his symptoms improve,we can consider starting next cycle. He started this cycle on 2/8.   Disposition:  Recommended evaluation in ER. Will follow up with patient upon discharge. I phoned ER triage and updated re: plan.   Visit Diagnosis 1. COVID-19 virus infection   2. Hypoxia   3. Kappa light chain myeloma (HCC)    Patient expressed understanding and was in agreement with this plan. He also understands that He can call clinic at any time with any questions, concerns, or complaints.   I discussed the assessment and treatment plan with the patient. The patient was provided an opportunity to ask questions and all were answered. The patient agreed with the plan and demonstrated an understanding of the instructions.   The patient was advised to call back or seek an in-person evaluation if the symptoms worsen or if the condition fails to improve as anticipated.   I spent 35 minutes face-to-face video visit time dedicated to the care of this patient on the date of this encounter to include pre-visit review of  labs, imaging, med onc notes, face-to-face time with the patient, and post visit ordering of testing/documentation.   Thank you for allowing me to participate in the care of this very pleasant patient.   Beckey Rutter, DNP, AGNP-C [facility]

## 2022-11-01 NOTE — ED Notes (Signed)
Blood work and covid swab in lab

## 2022-11-01 NOTE — ED Triage Notes (Signed)
Currently being treated for myeloma. Diagnosed with covid 10 days ago. Sent by Dr. For sob.

## 2022-11-01 NOTE — ED Notes (Signed)
Pt ambulates on RA with 93% or better Sao2

## 2022-11-01 NOTE — ED Provider Notes (Signed)
[facility] Provider Note    Event Date/Time   First MD Initiated Contact with Patient 11/01/22 1158     (approximate)   History   Cough   HPI  Jack [name] is a 89 y.o. male history of myeloma being treated at the cancer center recent diagnosis of COVID with recent prescription for amoxicillin with improvement presents to the ER for worsening shortness of breath and productive cough.  Home health nurse said that his oxygen level was low and sent him to the ER.  He states that he feels roughly at baseline no significant shortness of breath but is having more frequent cough.  Feels like his symptoms did get worse after the amoxicillin ran out.  Takes inhalers at home with some improvement.  Does not feel like he is wheezing his primary complaint is the frequent coughing.  No nausea or vomiting.     Physical Exam   Triage Vital Signs: ED Triage Vitals  Enc Vitals Group     BP 11/01/22 1132 132/78     Pulse Rate 11/01/22 1132 68     Resp 11/01/22 1132 20     Temp 11/01/22 1132 98.7 F (37.1 C)     Temp Source 11/01/22 1132 Oral     SpO2 11/01/22 1132 92 %     Weight 11/01/22 1133 168 lb 14 oz (76.6 kg)     Height 11/01/22 1133 5' 4"$  (1.626 m)     Head Circumference --      Peak Flow --      Pain Score 11/01/22 1133 0     Pain Loc --      Pain Edu? --      Excl. in [place]? --     Most recent vital signs: Vitals:   11/01/22 1132 11/01/22 1445  BP: 132/78 131/67  Pulse: 68 60  Resp: 20 18  Temp: 98.7 F (37.1 C) 98.5 F (36.9 C)  SpO2: 92% 91%     Constitutional: Alert  Eyes: Conjunctivae are normal.  Head: Atraumatic. Nose: No congestion/rhinnorhea. Mouth/Throat: Mucous membranes are moist.   Neck: Painless ROM.  Cardiovascular:   Good peripheral circulation. Respiratory: Normal respiratory effort.  Coarse bibasilar breath sounds. Gastrointestinal: Soft and nontender.  Musculoskeletal:  no deformity Neurologic:  MAE spontaneously. No  gross focal neurologic deficits are appreciated.  Skin:  Skin is warm, dry and intact. No rash noted. Psychiatric: Mood and affect are normal. Speech and behavior are normal.    ED Results / Procedures / Treatments   Labs (all labs ordered are listed, but only abnormal results are displayed) Labs Reviewed  RESP PANEL BY RT-PCR (RSV, FLU A&B, COVID)  RVPGX2 - Abnormal; Notable for the following components:      Result Value   SARS Coronavirus 2 by RT PCR POSITIVE (*)    All other components within normal limits  CBC WITH DIFFERENTIAL/PLATELET - Abnormal; Notable for the following components:   RBC 3.79 (*)    Hemoglobin 11.8 (*)    HCT 38.7 (*)    MCV 102.1 (*)    RDW 17.4 (*)    Platelets 72 (*)    All other components within normal limits  COMPREHENSIVE METABOLIC PANEL - Abnormal; Notable for the following components:   Glucose, Bld 129 (*)    BUN 27 (*)    Creatinine, Ser 1.38 (*)    Albumin 3.3 (*)    GFR, Estimated 49 (*)    All other  components within normal limits  LACTIC ACID, PLASMA     EKG     RADIOLOGY Please see ED Course for my review and interpretation.  I personally reviewed all radiographic images ordered to evaluate for the above acute complaints and reviewed radiology reports and findings.  These findings were personally discussed with the patient.  Please see medical record for radiology report.    PROCEDURES:  Critical Care performed: No  Procedures   MEDICATIONS ORDERED IN ED: Medications  ipratropium-albuterol (DUONEB) 0.5-2.5 (3) MG/3ML nebulizer solution 3 mL (3 mLs Nebulization Given 11/01/22 1320)     IMPRESSION / MDM / ASSESSMENT AND PLAN / ED COURSE  I reviewed the triage vital signs and the nursing notes.                              Differential diagnosis includes, but is not limited to, pneumonia, bronchitis, asthma, COPD, CHF, effusion, PE  Patient presented to the ER for evaluation of frequent cough and congestion as  described above.  Recent COVID illness.  He is not hypoxic not tachycardic.  Does not seem consistent with PE.  No significant wheezing on exam.  Chest x-ray will be ordered to evaluate for infiltrate or consolidation.   Clinical Course as of 11/01/22 1453  Mon Nov 01, 2022  1442 Patient family now requesting discharge.  He is able to ambulate without any hypoxia.  Given his productive cough that had previously improved on amoxicillin and now recurrence or requesting prescription for antibiotics which I do not think is unreasonable given his history of myeloma.  They do not want a wait for results of COVID testing.  Will be given prescription for doxycycline.  They have nebulizers at home. [PR]    Clinical Course User Index [PR] Merlyn Lot, MD      FINAL CLINICAL IMPRESSION(S) / ED DIAGNOSES   Final diagnoses:  Acute cough     Rx / DC Orders   ED Discharge Orders          Ordered    doxycycline (VIBRA-TABS) 100 MG tablet  2 times daily        11/01/22 1442             Note:  This document was prepared using Dragon voice recognition software and may include unintentional dictation errors.    Merlyn Lot, MD 11/01/22 [phone]

## 2022-11-01 NOTE — Telephone Encounter (Signed)
Spoke with patient's wife. Mychart visit arranged with lauren at 10 am. Pt is +covid. He is currently on day 12 of his revlimid. Patient has worsening cough/congestion shortness of breath- sats are in mid 90's at this time as long as he is sitting in recliner. He has recently finished his course of augmentin. Pt currently taking muscinex DM and Robitussin. Pt denies any fevers. He is not using his duoneb. He is on Eliquis and is also requesting a RF  Medications reconciled at the time of this phone call.

## 2022-11-01 NOTE — ED Triage Notes (Addendum)
Pt presents to the ED via POV due to worsening cough. Pt was diagnosed with COVID 2 weeks ago. Pt has a hx of cancer and PCP is concerned about pneumonia. Pt states he feels tired. Pt A&Ox4

## 2022-11-02 ENCOUNTER — Inpatient Hospital Stay (HOSPITAL_BASED_OUTPATIENT_CLINIC_OR_DEPARTMENT_OTHER): Payer: PPO | Admitting: Oncology

## 2022-11-02 ENCOUNTER — Inpatient Hospital Stay (HOSPITAL_BASED_OUTPATIENT_CLINIC_OR_DEPARTMENT_OTHER): Payer: PPO | Admitting: Pharmacist

## 2022-11-02 ENCOUNTER — Inpatient Hospital Stay: Payer: PPO

## 2022-11-02 DIAGNOSIS — C9000 Multiple myeloma not having achieved remission: Secondary | ICD-10-CM

## 2022-11-02 DIAGNOSIS — R2689 Other abnormalities of gait and mobility: Secondary | ICD-10-CM | POA: Diagnosis not present

## 2022-11-02 DIAGNOSIS — S32591D Other specified fracture of right pubis, subsequent encounter for fracture with routine healing: Secondary | ICD-10-CM | POA: Diagnosis not present

## 2022-11-02 DIAGNOSIS — J9601 Acute respiratory failure with hypoxia: Secondary | ICD-10-CM | POA: Diagnosis not present

## 2022-11-02 DIAGNOSIS — M6281 Muscle weakness (generalized): Secondary | ICD-10-CM | POA: Diagnosis not present

## 2022-11-02 NOTE — Progress Notes (Signed)
[facility]  [phone] Fax:([phone]) [phone]  Patient Care Team: [name], MD as PCP - General (Family Medicine) End, [name], MD as PCP - Cardiology (Cardiology) [name], MD (Endocrinology) Lloyd Huger, MD as Consulting Physician (Hematology and Oncology)   Name of the patient: Jack [name]  [id number]  09/26/1932   Date of visit: 11/02/22  Virtual visit: I connected with  [name] on 11/02/22 at 10:45 AM EST by a video enabled telemedicine application and verified that I am speaking with the correct person using two identifiers. I discussed the limitations of evaluation and management by telemedicine and the availability of in person appointments. The patient expressed understanding and agreed to proceed. Locations: Patient: home , Provider: in clinic  HPI: Patient is a 89 y.o. male with newly diagnosed multiple myeloma. Currently treated with Revlimid (lenalidomide) and dexamethasone. Patient was initiated on an all oral regimen because he was unable to physically make it into clinic at the time treatment due to his disease/performance status. His status improved he was able to come back to inperson appts on 07/14/21.   Reason for Consult: Oral chemotherapy follow-up for lenalidomide therapy.   PAST MEDICAL HISTORY: Past Medical History:  Diagnosis Date   Ascending aortic aneurysm ([place])    a. 12/2020 Echo: Asc Ao 79m, Ao root 438m b. 03/2022 Asc Ao 4.6 cm (4.5 cm in 2019)   CKD (chronic kidney disease), stage III (HCC)    Diastolic dysfunction    a. 12/2020 Echo: EF 50-55%, no rwma, mild LVH, GrI DD, nl RV fxn, mild AI. Asc Ao 4817mAo root 63m24m Elevated prostate specific antigen (PSA)    has been 7 for a year    GERD (gastroesophageal reflux disease)    History of colon polyps 2008   [facility] History of kidney stones    Hyperlipidemia    Hypertension     Myeloma (HCC)[place] PAF (paroxysmal atrial fibrillation) (HCC)[place] a. 01/2021-->Eliquis (CHA2DS2VASc = 3-4).   Pain    Prostate hypertrophy    diagnosed at age 78 d25 to hematospermia    HEMATOLOGY/ONCOLOGY HISTORY:  Oncology History  Kappa light chain myeloma (HCC)[place]/30/2022 Initial Diagnosis   Kappa light chain myeloma (HCC)[place]/15/2022 - 04/06/2021 Chemotherapy   Patient is on Treatment Plan : MYELOMA NON-TRANSPLANT CANDIDATES VRd SQ q21d      04/16/2021 Cancer Staging   Staging form: Plasma Cell Myeloma and Plasma Cell Disorders, AJCC 8th Edition - Clinical stage from 04/16/2021: Albumin (g/dL): 3.8, ISS: Stage II, High-risk cytogenetics: Absent, LDH: Unknown - Signed by FinnLloyd Huger on 04/16/2021 Albumin range (g/dL): Greater than or equal to 3.5 Cytogenetics: t(11;14) translocation Serum calcium level: Normal Serum creatinine level: Normal Bone disease on imaging: Present     ALLERGIES:  is allergic to quinolones, amlodipine, azithromycin, lipitor [atorvastatin], lisinopril, and zetia [ezetimibe].  MEDICATIONS:  Current Outpatient Medications  Medication Sig Dispense Refill   acetaminophen (TYLENOL) 500 MG tablet Take 500-1,000 mg by mouth every 8 (eight) hours as needed for moderate pain. (Patient not taking: Reported on 11/01/2022)     albuterol (VENTOLIN HFA) 108 (90 Base) MCG/ACT inhaler Inhale into the lungs.     Ascorbic Acid (VITAMIN C) 1000 MG tablet Take 1,000 mg by mouth in the morning and at bedtime.     Calcium Carb-Cholecalciferol (OYSTER SHELL CALCIUM W/D) 500-5 MG-MCG TABS Take 1  tablet by mouth in the morning and at bedtime.     carvedilol (COREG) 12.5 MG tablet Take 1 tablet (12.5 mg total) by mouth 2 (two) times daily. 180 tablet 3   chlorpheniramine-HYDROcodone (TUSSIONEX) 10-8 MG/5ML Take 5 mLs by mouth at bedtime as needed for cough. 70 mL 0   dexamethasone (DECADRON) 4 MG tablet TAKE 5 TABLETS BY MOUTH ONE TIME PER WEEK 20 tablet 4    dextromethorphan-guaiFENesin (MUCINEX DM) 30-600 MG 12hr tablet Take 1 tablet by mouth 2 (two) times daily.     doxycycline (VIBRA-TABS) 100 MG tablet Take 1 tablet (100 mg total) by mouth 2 (two) times daily for 7 days. 14 tablet 0   ELIQUIS 5 MG TABS tablet TAKE 1 TABLET BY MOUTH TWICE A DAY 180 tablet 0   feeding supplement (ENSURE ENLIVE / ENSURE PLUS) LIQD Take 237 mLs by mouth 3 (three) times daily between meals. (Patient taking differently: Take 237 mLs by mouth daily with lunch.) 237 mL 12   gabapentin (NEURONTIN) 300 MG capsule Take 2 capsules (600 mg total) by mouth 3 (three) times daily. 180 capsule 2   guaiFENesin (MUCINEX PO) Take by mouth as needed.     guaifenesin (ROBITUSSIN) 100 MG/5ML syrup Take 200 mg by mouth 3 (three) times daily as needed for cough.     ipratropium-albuterol (DUONEB) 0.5-2.5 (3) MG/3ML SOLN Take 3 mLs by nebulization every 4 (four) hours as needed. (Patient not taking: Reported on 11/01/2022) 360 mL 0   lenalidomide (REVLIMID) 20 MG capsule Take 1 capsule (20 mg total) by mouth daily. Take for 21 days, then hold for 7 days. Repeat every 28 days 21 capsule 0   lidocaine (XYLOCAINE) 5 % ointment Apply topically 3 (three) times daily as needed for mild pain or moderate pain. (Patient not taking: Reported on 11/01/2022) 50 g 2   morphine (MS CONTIN) 30 MG 12 hr tablet Take 1 tablet (30 mg total) by mouth every 8 (eight) hours. 90 tablet 0   Multiple Vitamin (MULTIVITAMIN WITH MINERALS) TABS tablet Take 1 tablet by mouth daily.     Multiple Vitamins-Minerals (PRESERVISION AREDS PO) Take 1 capsule by mouth in the morning and at bedtime.     mupirocin ointment (BACTROBAN) 2 % Apply 1 Application topically 2 (two) times daily. (Patient not taking: Reported on 11/01/2022) 22 g 0   naloxone (NARCAN) nasal spray 4 mg/0.1 mL SPRAY 1 SPRAY INTO ONE NOSTRIL AS DIRECTED FOR OPIOID OVERDOSE (TURN PERSON ON SIDE AFTER DOSE. IF NO RESPONSE IN 2-3 MINUTES OR PERSON RESPONDS BUT  RELAPSES, REPEAT USING A NEW SPRAY DEVICE AND SPRAY INTO THE OTHER NOSTRIL. CALL 911 AFTER USE.) * EMERGENCY USE ONLY * (Patient not taking: Reported on 11/01/2022) 1 each 0   omeprazole (PRILOSEC) 20 MG capsule Take 1 capsule (20 mg total) by mouth daily. (Patient not taking: Reported on 11/01/2022) 30 capsule 11   polyethylene glycol (MIRALAX / GLYCOLAX) 17 g packet Take 17 g by mouth 2 (two) times daily.  0   potassium citrate (UROCIT-K) 10 MEQ (1080 MG) SR tablet TAKE 1 TABLET (10 MEQ TOTAL) BY MOUTH 3 (THREE) TIMES DAILY WITH MEALS. 270 tablet 2   senna-docusate (SENOKOT-S) 8.6-50 MG tablet Take 1 tablet by mouth 2 (two) times daily. 30 tablet 0   No current facility-administered medications for this visit.    VITAL SIGNS: There were no vitals taken for this visit. There were no vitals filed for this visit.  Estimated body mass index is  28.99 kg/m as calculated from the following:   Height as of 11/01/22: 5' 4"$  (1.626 m).   Weight as of 11/01/22: 76.6 kg (168 lb 14 oz).  LABS: CBC:    Component Value Date/Time   WBC 5.7 11/01/2022 1142   HGB 11.8 (L) 11/01/2022 1142   HGB 11.0 (L) 02/24/2021 1014   HCT 38.7 (L) 11/01/2022 1142   HCT 35.3 (L) 02/24/2021 1014   PLT 72 (L) 11/01/2022 1142   PLT 249 02/24/2021 1014   MCV 102.1 (H) 11/01/2022 1142   MCV 96 02/24/2021 1014   MCV 90 01/24/2013 0819   NEUTROABS 3.8 11/01/2022 1142   NEUTROABS 7.4 (H) 01/24/2013 0819   LYMPHSABS 0.8 11/01/2022 1142   LYMPHSABS 1.2 01/24/2013 0819   MONOABS 0.8 11/01/2022 1142   MONOABS 0.3 01/24/2013 0819   EOSABS 0.1 11/01/2022 1142   EOSABS 0.0 01/24/2013 0819   BASOSABS 0.0 11/01/2022 1142   BASOSABS 0.0 01/24/2013 0819   Comprehensive Metabolic Panel:    Component Value Date/Time   NA 137 11/01/2022 1142   NA 136 02/24/2021 1014   NA 138 01/24/2013 0819   K 4.3 11/01/2022 1142   K 3.8 01/24/2013 0819   CL 102 11/01/2022 1142   CL 105 01/24/2013 0819   CO2 25 11/01/2022 1142   CO2 25  01/24/2013 0819   BUN 27 (H) 11/01/2022 1142   BUN 23 02/24/2021 1014   BUN 20 (H) 01/24/2013 0819   CREATININE 1.38 (H) 11/01/2022 1142   CREATININE 1.52 (H) 01/24/2013 0819   GLUCOSE 129 (H) 11/01/2022 1142   GLUCOSE 171 (H) 01/24/2013 0819   CALCIUM 9.0 11/01/2022 1142   CALCIUM 10.4 (H) 02/19/2021 1229   AST 24 11/01/2022 1142   AST 18 01/24/2013 0819   ALT 15 11/01/2022 1142   ALT 15 01/24/2013 0819   ALKPHOS 88 11/01/2022 1142   ALKPHOS 74 01/24/2013 0819   BILITOT 0.9 11/01/2022 1142   BILITOT 0.4 02/24/2021 1014   BILITOT 0.5 01/24/2013 0819   PROT 6.5 11/01/2022 1142   PROT 6.2 02/24/2021 1014   PROT 6.3 (L) 01/24/2013 0819   ALBUMIN 3.3 (L) 11/01/2022 1142   ALBUMIN 4.5 02/24/2021 1014   ALBUMIN 3.6 01/24/2013 0819    Present during today's visit: patient and his wife  Assessment and Plan-  Patient currently COVID positive and recovering. Instructed to hold lenaldiomide. MD will give him the go ahead to resume therapy if he is feeling better at his next scheduled appt.   Patient expressed understanding and was in agreement with this plan. He also understands that He can call clinic at any time with any questions, concerns, or complaints.   Follow-up plan: televisit in 2 weeks with MD  Thank you for allowing me to participate in the care of this very pleasant patient.   Time Total: 10 mins  Visit consisted of counseling and education on dealing with issues of symptom management in the setting of serious and potentially life-threatening illness.Greater than 50%  of this time was spent counseling and coordinating care related to the above assessment and plan.  Signed by: Darl Pikes, PharmD, BCPS, Salley Slaughter, CPP Hematology/Oncology Clinical Pharmacist Practitioner [place]/DB/AP [facility] [phone]  11/02/2022 3:30 PM

## 2022-11-02 NOTE — Progress Notes (Signed)
[facility]  [phone] [phone]  ID: Jack [name] OB: 1932-12-18  MR#: [id number]  [id number]  Patient Care Team: [name], MD as PCP - General (Family Medicine) End, [name], MD as PCP - Cardiology (Cardiology) [name], MD (Endocrinology) Lloyd Huger, MD as Consulting Physician (Hematology and Oncology)  I connected with Jack [name] on 11/02/22 at 10:00 AM EST by video enabled telemedicine visit and verified that I am speaking with the correct person using two identifiers.   I discussed the limitations, risks, security and privacy concerns of performing an evaluation and management service by telemedicine and the availability of in-person appointments. I also discussed with the patient that there may be a patient responsible charge related to this service. The patient expressed understanding and agreed to proceed.   Other persons participating in the visit and their role in the encounter: Patient, MD.  Patient's location: Home. Provider's location: Clinic.  CHIEF COMPLAINT: Stage II kappa chain myeloma.  INTERVAL HISTORY: Patient agreed to video assisted telemedicine visit for further evaluation.  He was recently diagnosed with COVID and was seen in the ED yesterday, but did not require admission.  He continues to have cough and shortness of breath but does not have an oxygen requirement.  He otherwise feels well. He has no neurologic complaints.  He denies any fevers.  He has a good appetite and denies weight loss.  He denies chest pain or hemoptysis.  He denies any nausea, vomiting, constipation, or diarrhea.  He has no urinary complaints.  Patient offers no further specific complaints today.  REVIEW OF SYSTEMS:   Review of Systems  Constitutional: Negative.  Negative for fever and malaise/fatigue.  Respiratory:  Positive for cough and shortness of breath. Negative for hemoptysis.   Cardiovascular:  Negative.  Negative for chest pain and leg swelling.  Gastrointestinal: Negative.  Negative for abdominal pain.  Genitourinary: Negative.  Negative for dysuria and flank pain.  Musculoskeletal:  Positive for back pain. Negative for falls and joint pain.  Skin: Negative.  Negative for rash.  Neurological: Negative.  Negative for dizziness, focal weakness, weakness and headaches.  Psychiatric/Behavioral: Negative.  The patient is not nervous/anxious.     As per HPI. Otherwise, a complete review of systems is negative.  PAST MEDICAL HISTORY: Past Medical History:  Diagnosis Date   Ascending aortic aneurysm ([place])    a. 12/2020 Echo: Asc Ao 23m, Ao root 424m b. 03/2022 Asc Ao 4.6 cm (4.5 cm in 2019)   CKD (chronic kidney disease), stage III (HCC)    Diastolic dysfunction    a. 12/2020 Echo: EF 50-55%, no rwma, mild LVH, GrI DD, nl RV fxn, mild AI. Asc Ao 4828mAo root 25m16m Elevated prostate specific antigen (PSA)    has been 7 for a year    GERD (gastroesophageal reflux disease)    [facility] of colon polyps [facility] of [facility]    Hyperlipidemia    Hypertension    Myeloma (HCC)[place] PAF (paroxysmal atrial fibrillation) (HCC)[place] a. 01/2021-->Eliquis (CHA2DS2VASc = 3-4).   Pain    Prostate hypertrophy    diagnosed at age 25 d20 to hematospermia    PAST SURGICAL HISTORY: Past Surgical History:  Procedure Laterality Date   CATARACT EXTRACTION W/PHACO Left 01/10/2018   Procedure: CATARACT EXTRACTION PHACO AND INTRAOCULAR LENS PLACEMENT (IOC)[place] PorfBirder Robson;  Location: [facility];  Service: Ophthalmology;  Laterality: Left;  [name] 00:24.8 AP% 14.9 CDE 3.68 Fluid pack lot # [id number]   CATARACT EXTRACTION W/PHACO Right 01/25/2018   Procedure: CATARACT EXTRACTION PHACO AND INTRAOCULAR LENS PLACEMENT (IOC);  Surgeon: Birder Robson, MD;  Location: [facility] ORS;  Service: Ophthalmology;  Laterality: Right;  [name] 00:42 AP% 10.8 CDE 4.59 Fluid pack lot  # [id number]   COLON SURGERY     CYSTOSCOPY W/ URETERAL STENT PLACEMENT Right 10/16/2017   Procedure: right  URETERAL STENT PLACEMENT,cystoscopy bilateral stent removal,rretrograde;  Surgeon: Abbie Sons, MD;  Location: [facility] ORS;  Service: Urology;  Laterality: Right;   CYSTOSCOPY/URETEROSCOPY/HOLMIUM LASER/STENT PLACEMENT Right 12/16/2020   Procedure: CYSTOSCOPY/URETEROSCOPY/HOLMIUM LASER/STENT PLACEMENT;  Surgeon: Abbie Sons, MD;  Location: [facility] ORS;  Service: Urology;  Laterality: Right;   EXTRACORPOREAL SHOCK WAVE LITHOTRIPSY Right 12/11/2020   Procedure: EXTRACORPOREAL SHOCK WAVE LITHOTRIPSY (ESWL);  Surgeon: Abbie Sons, MD;  Location: [facility] ORS;  Service: Urology;  Laterality: Right;   IR BONE TUMOR(S)RF ABLATION  04/16/2022   IR KYPHO EA ADDL LEVEL THORACIC OR LUMBAR  02/02/2021   IR KYPHO LUMBAR INC FX REDUCE BONE BX UNI/BIL CANNULATION INC/IMAGING  02/02/2021   IR KYPHO THORACIC WITH BONE BIOPSY  04/15/2022   KIDNEY STONE SURGERY     KYPHOPLASTY N/A 03/12/2021   Procedure: Hewitt Shorts, L3;  Surgeon: Hessie Knows, MD;  Location: [facility] ORS;  Service: Orthopedics;  Laterality: N/A;   RESECTION SOFT TISSUE TUMOR LEG / ANKLE RADICAL  jan 2009   [facility],  right thigh/knee , nonmalignant   SMALL INTESTINE SURGERY  1946   implaed on picket fence, punctured stomach   TONSILLECTOMY      FAMILY HISTORY: Family History  Problem Relation Age of Onset   Hypertension Father    Hyperlipidemia Father    Cancer Sister        thyroid - dx in late 20's    ADVANCED DIRECTIVES (Y/N):  N  HEALTH MAINTENANCE: Social History   Tobacco Use   Smoking status: Former    Types: Cigarettes    Quit date: 07/05/1965    Years since quitting: 57.3   Smokeless tobacco: Never  Vaping Use   Vaping Use: Never used  Substance Use Topics   Alcohol use: Not Currently    Alcohol/week: 1.0 standard drink of alcohol    Types: 1 Standard drinks or equivalent per week    Comment: occassionaly   Drug  use: No     Colonoscopy:  PAP:  Bone density:  Lipid panel:  Allergies  Allergen Reactions   Quinolones     Aortic dilation contraindicates FQ   Amlodipine Swelling and Other (See Comments)    LE edema   Azithromycin Nausea And Vomiting   Lipitor [Atorvastatin]     Muscle pain in RT Leg    Lisinopril Cough   Zetia [Ezetimibe]     Pain in legs     Current Outpatient Medications  Medication Sig Dispense Refill   acetaminophen (TYLENOL) 500 MG tablet Take 500-1,000 mg by mouth every 8 (eight) hours as needed for moderate pain. (Patient not taking: Reported on 11/01/2022)     albuterol (VENTOLIN HFA) 108 (90 Base) MCG/ACT inhaler Inhale into the lungs.     Ascorbic Acid (VITAMIN C) 1000 MG tablet Take 1,000 mg by mouth in the morning and at bedtime.     Calcium Carb-Cholecalciferol (OYSTER SHELL CALCIUM W/D) 500-5 MG-MCG TABS Take 1 tablet by mouth in the morning and at bedtime.  carvedilol (COREG) 12.5 MG tablet Take 1 tablet (12.5 mg total) by mouth 2 (two) times daily. 180 tablet 3   chlorpheniramine-HYDROcodone (TUSSIONEX) 10-8 MG/5ML Take 5 mLs by mouth at bedtime as needed for cough. 70 mL 0   dexamethasone (DECADRON) 4 MG tablet TAKE 5 TABLETS BY MOUTH ONE TIME PER WEEK 20 tablet 4   dextromethorphan-guaiFENesin (MUCINEX DM) 30-600 MG 12hr tablet Take 1 tablet by mouth 2 (two) times daily.     doxycycline (VIBRA-TABS) 100 MG tablet Take 1 tablet (100 mg total) by mouth 2 (two) times daily for 7 days. 14 tablet 0   ELIQUIS 5 MG TABS tablet TAKE 1 TABLET BY MOUTH TWICE A DAY 180 tablet 0   feeding supplement (ENSURE ENLIVE / ENSURE PLUS) LIQD Take 237 mLs by mouth 3 (three) times daily between meals. (Patient taking differently: Take 237 mLs by mouth daily with lunch.) 237 mL 12   gabapentin (NEURONTIN) 300 MG capsule Take 2 capsules (600 mg total) by mouth 3 (three) times daily. 180 capsule 2   guaiFENesin (MUCINEX PO) Take by mouth as needed.     guaifenesin (ROBITUSSIN) 100  MG/5ML syrup Take 200 mg by mouth 3 (three) times daily as needed for cough.     ipratropium-albuterol (DUONEB) 0.5-2.5 (3) MG/3ML SOLN Take 3 mLs by nebulization every 4 (four) hours as needed. (Patient not taking: Reported on 11/01/2022) 360 mL 0   lenalidomide (REVLIMID) 20 MG capsule Take 1 capsule (20 mg total) by mouth daily. Take for 21 days, then hold for 7 days. Repeat every 28 days 21 capsule 0   lidocaine (XYLOCAINE) 5 % ointment Apply topically 3 (three) times daily as needed for mild pain or moderate pain. (Patient not taking: Reported on 11/01/2022) 50 g 2   morphine (MS CONTIN) 30 MG 12 hr tablet Take 1 tablet (30 mg total) by mouth every 8 (eight) hours. 90 tablet 0   Multiple Vitamin (MULTIVITAMIN WITH MINERALS) TABS tablet Take 1 tablet by mouth daily.     Multiple Vitamins-Minerals (PRESERVISION AREDS PO) Take 1 capsule by mouth in the morning and at bedtime.     mupirocin ointment (BACTROBAN) 2 % Apply 1 Application topically 2 (two) times daily. (Patient not taking: Reported on 11/01/2022) 22 g 0   naloxone (NARCAN) nasal spray 4 mg/0.1 mL SPRAY 1 SPRAY INTO ONE NOSTRIL AS DIRECTED FOR OPIOID OVERDOSE (TURN PERSON ON SIDE AFTER DOSE. IF NO RESPONSE IN 2-3 MINUTES OR PERSON RESPONDS BUT RELAPSES, REPEAT USING A NEW SPRAY DEVICE AND SPRAY INTO THE OTHER NOSTRIL. CALL 911 AFTER USE.) * EMERGENCY USE ONLY * (Patient not taking: Reported on 11/01/2022) 1 each 0   omeprazole (PRILOSEC) 20 MG capsule Take 1 capsule (20 mg total) by mouth daily. (Patient not taking: Reported on 11/01/2022) 30 capsule 11   polyethylene glycol (MIRALAX / GLYCOLAX) 17 g packet Take 17 g by mouth 2 (two) times daily.  0   potassium citrate (UROCIT-K) 10 MEQ (1080 MG) SR tablet TAKE 1 TABLET (10 MEQ TOTAL) BY MOUTH 3 (THREE) TIMES DAILY WITH MEALS. 270 tablet 2   senna-docusate (SENOKOT-S) 8.6-50 MG tablet Take 1 tablet by mouth 2 (two) times daily. 30 tablet 0   No current facility-administered medications for this  visit.    OBJECTIVE: There were no vitals filed for this visit.      There is no height or weight on file to calculate BMI.    ECOG FS:1 - Symptomatic but completely ambulatory  General:  Well-developed, well-nourished, no acute distress. HEENT: Normocephalic. Neuro: Alert, answering all questions appropriately. Cranial nerves grossly intact. Psych: Normal affect.  LAB RESULTS:  Lab Results  Component Value Date   NA 137 11/01/2022   K 4.3 11/01/2022   CL 102 11/01/2022   CO2 25 11/01/2022   GLUCOSE 129 (H) 11/01/2022   BUN 27 (H) 11/01/2022   CREATININE 1.38 (H) 11/01/2022   CALCIUM 9.0 11/01/2022   PROT 6.5 11/01/2022   ALBUMIN 3.3 (L) 11/01/2022   AST 24 11/01/2022   ALT 15 11/01/2022   ALKPHOS 88 11/01/2022   BILITOT 0.9 11/01/2022   GFRNONAA 49 (L) 11/01/2022   GFRAA 29 (L) 10/16/2017    Lab Results  Component Value Date   WBC 5.7 11/01/2022   NEUTROABS 3.8 11/01/2022   HGB 11.8 (L) 11/01/2022   HCT 38.7 (L) 11/01/2022   MCV 102.1 (H) 11/01/2022   PLT 72 (L) 11/01/2022     STUDIES: DG Chest 2 View  Result Date: 11/01/2022 CLINICAL DATA:  Cough. Recent cough for 1 week. History of multiple myeloma. EXAM: CHEST - 2 VIEW COMPARISON:  Chest radiographs 10/05/2022, 03/26/2021; CT chest FINDINGS: Cardiac silhouette and mediastinal contours are unchanged and within normal limits with mildly tortuous descending thoracic aorta again noted. Note is made on prior CT 03/22/2022 aneurysmal ascending aorta measuring up to 4.6 cm. Long-term stable calcified nodule within the superior left lung, unchanged from 03/26/2021 radiographs and seen to represent anterior pleural calcification on 03/22/2022 CT. No new acute airspace opacity is seen. No pleural effusion or pneumothorax. No significant change in chronic lower thoracic and upper lumbar compression deformities with augmentation cement. IMPRESSION: 1. No acute lung process identified. 2. Chronic lower thoracic and upper  lumbar compression deformities. Electronically Signed   By: Yvonne Kendall M.D.   On: 11/01/2022 12:46   DG Chest 2 View  Result Date: 10/05/2022 CLINICAL DATA:  shortness of breath EXAM: CHEST - 2 VIEW COMPARISON:  06/10/2022 FINDINGS: Cardiac silhouette is unremarkable. No pneumothorax or pleural effusion. The lungs are clear. Aorta tortuous, calcified and ectatic. Osseous structures are osteopenic. Multilevel lower thoracic and lumbar spine vertebroplasties noted with compression deformities. IMPRESSION: No acute cardiopulmonary process. Electronically Signed   By: Sammie Bench M.D.   On: 10/05/2022 13:59    ASSESSMENT: Stage II Kappa chain myeloma.  PLAN:    Stage II kappa chain myeloma: (11:14 translocation, high risk) SPEP essentially negative and immunoglobulins are within normal limits.  Patient's kappa light chains have improved to greater than 5400 down to 39.1.  Recently they trended up to 57.0, today's results pending.  Cycle 1 included Velcade and Revlimid, but given his declining performance status at the time and difficulty to get into clinic this was transitioned to Revlimid only.  Given persistent neutropenia, Revlimid was previously dose reduced to 15 mg daily, but patient is now tolerating an increased dose of 20 mg for 21 days with 7 days off.   He is also taking 20 mg dexamethasone weekly.  PET scan results from December 08, 2021 reviewed independently with no obvious evidence of progressive disease.  Patient initiated monthly Xgeva on August 11, 2021.  Patient has been instructed to hold his Revlimid until his symptoms related to COVID have improved.  He will have video assisted telemedicine visit in 2 weeks for further evaluation.  Appreciate clinical pharmacy input.   Anemia: Nearly resolved.  Patient's most recent hemoglobin is 11.8. Right flank mass: MRI results from July 13, 2021 reviewed  independently with no obvious pathology on his right flank, but did reveal several  fractured ribs.  Continue narcotic regimen as above. Bone lesions: Continue Xgeva as above. Neutropenia: Resolved.  Continue Revlimid 20 mg as above. Thrombocytopenia: Chronic and unchanged.  Patient's platelet count is 72.  Hold Revlimid as above.  Pain: Patient does not complain of this today.  Continue current narcotic treatment.  Continue ibuprofen as needed, but patient has been instructed if his platelets fall below 50 this would need to be discontinued.  He was also previously given a referral back to radiation oncology. Transaminitis: Resolved.  Patient expressed understanding and was in agreement with this plan. He also understands that He can call clinic at any time with any questions, concerns, or complaints.     Cancer Staging  Kappa light chain myeloma ([place]) Staging form: Plasma Cell Myeloma and Plasma Cell Disorders, AJCC 8th Edition - Clinical stage from 04/16/2021: Albumin (g/dL): 3.8, ISS: Stage II, High-risk cytogenetics: Absent, LDH: Unknown - Signed by Lloyd Huger, MD on 04/16/2021 Albumin range (g/dL): Greater than or equal to 3.5 Cytogenetics: t(11;14) translocation Serum calcium level: Normal Serum creatinine level: Normal Bone disease on imaging: Present  Lloyd Huger, MD   11/02/2022 12:03 PM

## 2022-11-04 ENCOUNTER — Other Ambulatory Visit: Payer: Self-pay | Admitting: *Deleted

## 2022-11-05 MED ORDER — IPRATROPIUM-ALBUTEROL 0.5-2.5 (3) MG/3ML IN SOLN: 3.0000 mL | RESPIRATORY_TRACT | 0 refills | Status: DC | PRN

## 2022-11-06 DIAGNOSIS — S32592D Other specified fracture of left pubis, subsequent encounter for fracture with routine healing: Secondary | ICD-10-CM | POA: Diagnosis not present

## 2022-11-06 DIAGNOSIS — M6281 Muscle weakness (generalized): Secondary | ICD-10-CM | POA: Diagnosis not present

## 2022-11-06 DIAGNOSIS — R2689 Other abnormalities of gait and mobility: Secondary | ICD-10-CM | POA: Diagnosis not present

## 2022-11-06 DIAGNOSIS — C9000 Multiple myeloma not having achieved remission: Secondary | ICD-10-CM | POA: Diagnosis not present

## 2022-11-06 DIAGNOSIS — J9601 Acute respiratory failure with hypoxia: Secondary | ICD-10-CM | POA: Diagnosis not present

## 2022-11-08 ENCOUNTER — Telehealth: Payer: Self-pay

## 2022-11-08 ENCOUNTER — Ambulatory Visit: Payer: PPO | Admitting: Radiation Oncology

## 2022-11-08 NOTE — Telephone Encounter (Signed)
     Patient  visit on 2/19  at [facility]   Have you been able to follow up with your primary care physician? Yes   The patient was or was not able to obtain any needed medicine or equipment. Yes   Are there diet recommendations that you are having difficulty following? Na   Patient expresses understanding of discharge instructions and education provided has no other needs at this time.  Yes      [place] [phone], [place], [place] [address] Phone: [phone] Email: [email or web address]

## 2022-11-09 DIAGNOSIS — H35033 Hypertensive retinopathy, bilateral: Secondary | ICD-10-CM | POA: Diagnosis not present

## 2022-11-09 DIAGNOSIS — H353231 Exudative age-related macular degeneration, bilateral, with active choroidal neovascularization: Secondary | ICD-10-CM | POA: Diagnosis not present

## 2022-11-09 DIAGNOSIS — H43812 Vitreous degeneration, left eye: Secondary | ICD-10-CM | POA: Diagnosis not present

## 2022-11-10 ENCOUNTER — Other Ambulatory Visit: Payer: Self-pay

## 2022-11-10 ENCOUNTER — Encounter: Payer: Self-pay | Admitting: Radiation Oncology

## 2022-11-10 ENCOUNTER — Ambulatory Visit
Admission: RE | Admit: 2022-11-10 | Discharge: 2022-11-10 | Disposition: A | Discharge status: Home / Self Care | Payer: PPO | Source: Ambulatory Visit | Attending: Radiation Oncology | Admitting: Radiation Oncology

## 2022-11-10 VITALS — BP 126/82 | HR 56 | Temp 97.7°F | Ht 68.0 in | Wt 167.0 lb

## 2022-11-10 DIAGNOSIS — C7951 Secondary malignant neoplasm of bone: Secondary | ICD-10-CM

## 2022-11-10 DIAGNOSIS — C9000 Multiple myeloma not having achieved remission: Secondary | ICD-10-CM | POA: Insufficient documentation

## 2022-11-10 DIAGNOSIS — Z8616 Personal history of COVID-19: Secondary | ICD-10-CM | POA: Diagnosis not present

## 2022-11-10 DIAGNOSIS — G893 Neoplasm related pain (acute) (chronic): Secondary | ICD-10-CM | POA: Insufficient documentation

## 2022-11-10 DIAGNOSIS — Z923 Personal history of irradiation: Secondary | ICD-10-CM | POA: Insufficient documentation

## 2022-11-10 NOTE — Progress Notes (Signed)
.  Radiation Oncology Follow up Note  Name: Jack [name]   Date:   11/10/2022 MRN:  [id number] DOB: 09-17-1932    This 89 y.o. male presents to the clinic today for 1 month follow-up status post palliative radiation therapy to his right scapula and patient previously treated to his spine both thoracic and lumbar for multiple myeloma.  REFERRING PROVIDER: [name], MD  HPI: Present time patient is 1 month out from palliative radiation therapy single fraction to his right scapula for multiple myeloma he had previous been treated to his thoracic lumbar spine.  He is still having back pain which is not unexpected.  He did have COVID recently has been off all his medications including Revlimid.  He is ambulating well.[name]  He does have documented fractured ribs some of his back pain may be associated with his chronic cough which developed during COVID.  COMPLICATIONS OF TREATMENT: none  FOLLOW UP COMPLIANCE: keeps appointments   PHYSICAL EXAM:  BP 126/82 (BP Location: Left Arm, Patient Position: Sitting, Cuff Size: Normal)   Pulse (!) 56   Temp 97.7 F (36.5 C) (Tympanic)   Ht 5' 8"$  (1.727 m) Comment: stated ht  Wt 167 lb (75.8 kg)   BMI 25.39 kg/m  Deep palpation of spine does not elicit pain.  Motor or sensory in detail levels are in the lower extremities equal and symmetric.  Well-developed well-nourished patient in NAD. HEENT reveals PERLA, EOMI, discs not visualized.  Oral cavity is clear. No oral mucosal lesions are identified. Neck is clear without evidence of cervical or supraclavicular adenopathy. Lungs are clear to A&P. Cardiac examination is essentially unremarkable with regular rate and rhythm without murmur rub or thrill. Abdomen is benign with no organomegaly or masses noted. Motor sensory and DTR levels are equal and symmetric in the upper and lower extremities. Cranial nerves II through XII are grossly intact. Proprioception is intact. No peripheral adenopathy or edema is  identified. No motor or sensory levels are noted. Crude visual fields are within normal range.  RADIOLOGY RESULTS: No current films for review  PLAN: This time I will turn follow-up care over to medical oncology.  I be happy to reevaluate the patient in time should further palliative treatment be indicated.  He continues on Revlimid probably the next couple of weeks.  He also is on Xgeva.  Patient and family know to call with any concerns.  I would like to take this opportunity to thank you for allowing me to participate in the care of your patient.Noreene Filbert, MD

## 2022-11-11 ENCOUNTER — Other Ambulatory Visit: Payer: Self-pay | Admitting: *Deleted

## 2022-11-11 DIAGNOSIS — C9000 Multiple myeloma not having achieved remission: Secondary | ICD-10-CM

## 2022-11-11 NOTE — Telephone Encounter (Signed)
Revlimid currently on hold. When time to restart Rx will be sent as a partial fill taking into account how much he has on hand currently.

## 2022-11-12 NOTE — Telephone Encounter (Signed)
Jack [name]

## 2022-11-15 ENCOUNTER — Inpatient Hospital Stay: Payer: PPO | Attending: Oncology | Admitting: Hospice and Palliative Medicine

## 2022-11-15 DIAGNOSIS — G893 Neoplasm related pain (acute) (chronic): Secondary | ICD-10-CM

## 2022-11-15 DIAGNOSIS — Z79899 Other long term (current) drug therapy: Secondary | ICD-10-CM | POA: Insufficient documentation

## 2022-11-15 DIAGNOSIS — Z515 Encounter for palliative care: Secondary | ICD-10-CM

## 2022-11-15 DIAGNOSIS — C9000 Multiple myeloma not having achieved remission: Secondary | ICD-10-CM | POA: Diagnosis not present

## 2022-11-15 NOTE — Progress Notes (Signed)
Virtual Visit via Telephone Note  I connected with Jack [name] on 11/15/22 at  3:00 PM EST by telephone and verified that I am speaking with the correct person using two identifiers.  Location: Patient: Home Provider: Clinic   I discussed the limitations, risks, security and privacy concerns of performing an evaluation and management service by telephone and the availability of in person appointments. I also discussed with the patient that there may be a patient responsible charge related to this service. The patient expressed understanding and agreed to proceed.   History of Present Illness: Jack [name] is a 89 y.o. male with multiple medical problems including hypertension, CKD stage IIIb, A. fib on Eliquis, and anemia.  Patient was hospitalized 01/02/2021 to 01/07/2021 with T12 compression fracture with recommendation for conservative management including use of a TLSO brace.  Patient was hospitalized again 01/29/2021 to 02/07/2021 with recurrent back pain and MR showing T12 and new L1 compression fracture.  Patient underwent T12/L1 kyphoplasty on 02/02/2021.  There was also suspicion of a left fifth rib fracture, which occurred while transitioning from the stretcher for his kyphoplasty.  Patient had mild hypercalcemia with SPEP and UPEP concerning for possible myeloma.  Patient was [facility] to [facility] for work[facility]up.  He was readmitted 03/11/2021 - 03/17/2021 with worsening back pain.  CT revealed new compression fractures of L2 and L3.  Patient underwent kyphoplasty on 6/30.  He was hospitalized again 03/26/2021-04/07/2021 with severe back/flank pain.  MRI revealed T10 pathologic compression fracture.  He was started on treatment for multiple myeloma with dexamethasone/Velcade and XRT to spine.   Patient is s/p T11 kyphoplasty and ablation.    Observations/Objective: Spoke with patient and wife by phone.    Patient reports he is doing reasonably well.  He denies significant changes or  concerns.  He reports that he is recovered back to baseline after his COVID infection last month.  He is still continuing to hold the Revlimid as previously directed.  Patient sees Dr. Grayland Ormond virtually tomorrow to discuss resumption of treatment.  Pain is reportedly stable on MS Contin.  Patient reports he might have had slight improvement following radiation treatment.  Patient denies any adverse effects from pain medication.  He will be due for refill next week.   Assessment and Plan: Chronic back pain secondary to multiple myeloma and history of recurrent/chronic compression fractures.  S/p XRT. Pain improved slightly. Continue MS Contin 30 mg to every 8 hours.  PDMP reviewed.  Neuropathy -Continue gabapentin.  Follow Up Instructions: Follow-up telephone visit 1 to 2 months   I discussed the assessment and treatment plan with the patient. The patient was provided an opportunity to ask questions and all were answered. The patient agreed with the plan and demonstrated an understanding of the instructions.   The patient was advised to call back or seek an in-person evaluation if the symptoms worsen or if the condition fails to improve as anticipated.  I provided 5 minutes of non-face-to-face time during this encounter.   Irean Hong, NP

## 2022-11-16 ENCOUNTER — Inpatient Hospital Stay (HOSPITAL_BASED_OUTPATIENT_CLINIC_OR_DEPARTMENT_OTHER): Payer: PPO | Admitting: Oncology

## 2022-11-16 ENCOUNTER — Encounter: Payer: Self-pay | Admitting: Pharmacist

## 2022-11-16 ENCOUNTER — Other Ambulatory Visit: Payer: Self-pay | Admitting: Pharmacist

## 2022-11-16 ENCOUNTER — Encounter: Payer: Self-pay | Admitting: Oncology

## 2022-11-16 DIAGNOSIS — C9000 Multiple myeloma not having achieved remission: Secondary | ICD-10-CM

## 2022-11-16 MED ORDER — LENALIDOMIDE 20 MG PO CAPS: 20.0000 mg | ORAL_CAPSULE | Freq: Every day | ORAL | 0 refills | Status: DC

## 2022-11-16 NOTE — Progress Notes (Signed)
Spoke to patient wife for phone assessment prior to virtual visit today she states that she would like to know when patient can do labs and get back on revlimid. Patient is having bad back pain. Wife states that patient recently saw Dr Baruch Gouty who did one radiation treatment on back and told them he wouldn't do anymore until [facility] told him to.patient is still having some cough and SOB from recent covid infection.

## 2022-11-16 NOTE — Progress Notes (Signed)
[place]  [phone] [phone]) [phone]  ID: Jack [name] OB: 26-Apr-1933  MR#: [id number]  [id number]  Patient Care Team: [name], MD as PCP - General (Family Medicine) End, [name], MD as PCP - Cardiology (Cardiology) [name], MD (Endocrinology) Lloyd Huger, MD as Consulting Physician (Hematology and Oncology)  I connected with Jack [name] on 11/16/22 at  3:30 PM EST by video enabled telemedicine visit and verified that I am speaking with the correct person using two identifiers.   I discussed the limitations, risks, security and privacy concerns of performing an evaluation and management service by telemedicine and the availability of in-person appointments. I also discussed with the patient that there may be a patient responsible charge related to this service. The patient expressed understanding and agreed to proceed.   Other persons participating in the visit and their role in the encounter: Patient, MD.  Patient's location: Home. Provider's location: Clinic.   CHIEF COMPLAINT: Stage II kappa chain myeloma.  INTERVAL HISTORY: Patient agreed to video assisted telemedicine visit for further evaluation and discussion on whether or not to reinitiate Revlimid.  He has residual mild cough, but otherwise feels well and is fully recovered from his COVID infection.  He continues to complain of back pain, but this is well-controlled on his current narcotic regimen.  He has no neurologic complaints.  He denies any fevers.  He has a good appetite and denies weight loss.  He denies chest pain, shortness of breath, or hemoptysis.  He denies any nausea, vomiting, constipation, or diarrhea.  He has no urinary complaints.  Patient offers no further specific complaints today.  REVIEW OF SYSTEMS:   Review of Systems  Constitutional: Negative.  Negative for fever and malaise/fatigue.  Respiratory:  Positive for cough.  Negative for hemoptysis and shortness of breath.   Cardiovascular: Negative.  Negative for chest pain and leg swelling.  Gastrointestinal: Negative.  Negative for abdominal pain.  Genitourinary: Negative.  Negative for dysuria and flank pain.  Musculoskeletal:  Positive for back pain. Negative for falls and joint pain.  Skin: Negative.  Negative for rash.  Neurological: Negative.  Negative for dizziness, focal weakness, weakness and headaches.  Psychiatric/Behavioral: Negative.  The patient is not nervous/anxious.     As per HPI. Otherwise, a complete review of systems is negative.  PAST MEDICAL HISTORY: Past Medical History:  Diagnosis Date   Ascending aortic aneurysm ([place])    a. 12/2020 Echo: Asc Ao 61m, Ao root 420m b. 03/2022 Asc Ao 4.6 cm (4.5 cm in 2019)   CKD (chronic kidney disease), stage III (HCC)    Diastolic dysfunction    a. 12/2020 Echo: EF 50-55%, no rwma, mild LVH, GrI DD, nl RV fxn, mild AI. Asc Ao 4813mAo root 76m44m Elevated prostate specific antigen (PSA)    has been 7 for a year    GERD (gastroesophageal reflux disease)    History of colon polyps 2008   [facility] History of kidney stones    Hyperlipidemia    Hypertension    Myeloma (HCC)[place] PAF (paroxysmal atrial fibrillation) (HCC)[place] a. 01/2021-->Eliquis (CHA2DS2VASc = 3-4).   Pain    Prostate hypertrophy    diagnosed at age 30 d25 to hematospermia    PAST SURGICAL HISTORY: Past Surgical History:  Procedure Laterality Date   CATARACT EXTRACTION W/PHACO Left 01/10/2018   Procedure: CATARACT EXTRACTION PHACO AND INTRAOCULAR LENS PLACEMENT (IOC)[place]  Surgeon: Birder Robson, MD;  Location: [facility] ORS;  Service: Ophthalmology;  Laterality: Left;  [name] 00:24.8 AP% 14.9 CDE 3.68 Fluid pack lot # [id number]   CATARACT EXTRACTION W/PHACO Right 01/25/2018   Procedure: CATARACT EXTRACTION PHACO AND INTRAOCULAR LENS PLACEMENT (IOC);  Surgeon: Birder Robson, MD;  Location: [facility] ORS;  Service:  Ophthalmology;  Laterality: Right;  [name] 00:42 AP% 10.8 CDE 4.59 Fluid pack lot # [id number]   COLON SURGERY     CYSTOSCOPY W/ URETERAL STENT PLACEMENT Right 10/16/2017   Procedure: right  URETERAL STENT PLACEMENT,cystoscopy bilateral stent removal,rretrograde;  Surgeon: Abbie Sons, MD;  Location: [facility] ORS;  Service: Urology;  Laterality: Right;   CYSTOSCOPY/URETEROSCOPY/HOLMIUM LASER/STENT PLACEMENT Right 12/16/2020   Procedure: CYSTOSCOPY/URETEROSCOPY/HOLMIUM LASER/STENT PLACEMENT;  Surgeon: Abbie Sons, MD;  Location: [facility] ORS;  Service: Urology;  Laterality: Right;   EXTRACORPOREAL SHOCK WAVE LITHOTRIPSY Right 12/11/2020   Procedure: EXTRACORPOREAL SHOCK WAVE LITHOTRIPSY (ESWL);  Surgeon: Abbie Sons, MD;  Location: [facility] ORS;  Service: Urology;  Laterality: Right;   IR BONE TUMOR(S)RF ABLATION  04/16/2022   IR KYPHO EA ADDL LEVEL THORACIC OR LUMBAR  02/02/2021   IR KYPHO LUMBAR INC FX REDUCE BONE BX UNI/BIL CANNULATION INC/IMAGING  02/02/2021   IR KYPHO THORACIC WITH BONE BIOPSY  04/15/2022   KIDNEY STONE SURGERY     KYPHOPLASTY N/A 03/12/2021   Procedure: [place]itt Shorts, L3;  Surgeon: Hessie Knows, MD;  Location: [facility] ORS;  Service: Orthopedics;  Laterality: N/A;   RESECTION SOFT TISSUE TUMOR LEG / ANKLE RADICAL  jan 2009   [facility],  right thigh/knee , nonmalignant   SMALL INTESTINE SURGERY  1946   implaed on picket fence, punctured stomach   TONSILLECTOMY      FAMILY HISTORY: Family History  Problem Relation Age of Onset   Hypertension Father    Hyperlipidemia Father    Cancer Sister        thyroid - dx in late 20's    ADVANCED DIRECTIVES (Y/N):  N  HEALTH MAINTENANCE: Social History   Tobacco Use   Smoking status: Former    Types: Cigarettes    Quit date: 07/05/1965    Years since quitting: 57.4   Smokeless tobacco: Never  Vaping Use   Vaping Use: Never used  Substance Use Topics   Alcohol use: Not Currently    Alcohol/week: 1.0 standard drink of alcohol     Types: 1 Standard drinks or equivalent per week    Comment: occassionaly   Drug use: No     Colonoscopy:  PAP:  Bone density:  Lipid panel:  Allergies  Allergen Reactions   Quinolones     Aortic dilation contraindicates FQ   Amlodipine Swelling and Other (See Comments)    LE edema   Azithromycin Nausea And Vomiting   Lipitor [Atorvastatin]     Muscle pain in RT Leg    Lisinopril Cough   Zetia [Ezetimibe]     Pain in legs     Current Outpatient Medications  Medication Sig Dispense Refill   acetaminophen (TYLENOL) 500 MG tablet Take 500-1,000 mg by mouth every 8 (eight) hours as needed for moderate pain.     albuterol (VENTOLIN HFA) 108 (90 Base) MCG/ACT inhaler Inhale into the lungs.     Ascorbic Acid (VITAMIN C) 1000 MG tablet Take 1,000 mg by mouth in the morning and at bedtime.     Calcium Carb-Cholecalciferol (OYSTER SHELL CALCIUM W/D) 500-5 MG-MCG TABS Take 1 tablet by mouth in the morning and at  bedtime.     carvedilol (COREG) 12.5 MG tablet Take 1 tablet (12.5 mg total) by mouth 2 (two) times daily. 180 tablet 3   ELIQUIS 5 MG TABS tablet TAKE 1 TABLET BY MOUTH TWICE A DAY 180 tablet 0   feeding supplement (ENSURE ENLIVE / ENSURE PLUS) LIQD Take 237 mLs by mouth 3 (three) times daily between meals. (Patient taking differently: Take 237 mLs by mouth daily with lunch.) 237 mL 12   gabapentin (NEURONTIN) 300 MG capsule Take 2 capsules (600 mg total) by mouth 3 (three) times daily. 180 capsule 2   ipratropium-albuterol (DUONEB) 0.5-2.5 (3) MG/3ML SOLN Take 3 mLs by nebulization every 4 (four) hours as needed. 360 mL 0   morphine (MS CONTIN) 30 MG 12 hr tablet Take 1 tablet (30 mg total) by mouth every 8 (eight) hours. 90 tablet 0   Multiple Vitamin (MULTIVITAMIN WITH MINERALS) TABS tablet Take 1 tablet by mouth daily.     Multiple Vitamins-Minerals (PRESERVISION AREDS PO) Take 1 capsule by mouth in the morning and at bedtime.     polyethylene glycol (MIRALAX / GLYCOLAX) 17 g  packet Take 17 g by mouth 2 (two) times daily.  0   potassium citrate (UROCIT-K) 10 MEQ (1080 MG) SR tablet TAKE 1 TABLET (10 MEQ TOTAL) BY MOUTH 3 (THREE) TIMES DAILY WITH MEALS. 270 tablet 2   senna-docusate (SENOKOT-S) 8.6-50 MG tablet Take 1 tablet by mouth 2 (two) times daily. 30 tablet 0   dexamethasone (DECADRON) 4 MG tablet TAKE 5 TABLETS BY MOUTH ONE TIME PER WEEK (Patient not taking: Reported on 11/10/2022) 20 tablet 4   lenalidomide (REVLIMID) 20 MG capsule Take 1 capsule (20 mg total) by mouth daily. Take for 21 days, then hold for 7 days. Repeat every 28 days (Patient not taking: Reported on 11/10/2022) 21 capsule 0   lidocaine (XYLOCAINE) 5 % ointment Apply topically 3 (three) times daily as needed for mild pain or moderate pain. (Patient not taking: Reported on 11/01/2022) 50 g 2   mupirocin ointment (BACTROBAN) 2 % Apply 1 Application topically 2 (two) times daily. (Patient not taking: Reported on 11/01/2022) 22 g 0   naloxone (NARCAN) nasal spray 4 mg/0.1 mL SPRAY 1 SPRAY INTO ONE NOSTRIL AS DIRECTED FOR OPIOID OVERDOSE (TURN PERSON ON SIDE AFTER DOSE. IF NO RESPONSE IN 2-3 MINUTES OR PERSON RESPONDS BUT RELAPSES, REPEAT USING A NEW SPRAY DEVICE AND SPRAY INTO THE OTHER NOSTRIL. CALL 911 AFTER USE.) * EMERGENCY USE ONLY * (Patient not taking: Reported on 11/01/2022) 1 each 0   omeprazole (PRILOSEC) 20 MG capsule Take 1 capsule (20 mg total) by mouth daily. (Patient not taking: Reported on 11/01/2022) 30 capsule 11   No current facility-administered medications for this visit.    OBJECTIVE: There were no vitals filed for this visit.      There is no height or weight on file to calculate BMI.    ECOG FS:1 - Symptomatic but completely ambulatory  General: Well-developed, well-nourished, no acute distress. HEENT: Normocephalic. Neuro: Alert, answering all questions appropriately. Cranial nerves grossly intact. Psych: Normal affect.  LAB RESULTS:  Lab Results  Component Value Date    NA 137 11/01/2022   K 4.3 11/01/2022   CL 102 11/01/2022   CO2 25 11/01/2022   GLUCOSE 129 (H) 11/01/2022   BUN 27 (H) 11/01/2022   CREATININE 1.38 (H) 11/01/2022   CALCIUM 9.0 11/01/2022   PROT 6.5 11/01/2022   ALBUMIN 3.3 (L) 11/01/2022   AST 24  11/01/2022   ALT 15 11/01/2022   ALKPHOS 88 11/01/2022   BILITOT 0.9 11/01/2022   GFRNONAA 49 (L) 11/01/2022   GFRAA 29 (L) 10/16/2017    Lab Results  Component Value Date   WBC 5.7 11/01/2022   NEUTROABS 3.8 11/01/2022   HGB 11.8 (L) 11/01/2022   HCT 38.7 (L) 11/01/2022   MCV 102.1 (H) 11/01/2022   PLT 72 (L) 11/01/2022     STUDIES: DG Chest 2 View  Result Date: 11/01/2022 CLINICAL DATA:  Cough. Recent cough for 1 week. History of multiple myeloma. EXAM: CHEST - 2 VIEW COMPARISON:  Chest radiographs 10/05/2022, 03/26/2021; CT chest FINDINGS: Cardiac silhouette and mediastinal contours are unchanged and within normal limits with mildly tortuous descending thoracic aorta again noted. Note is made on prior CT 03/22/2022 aneurysmal ascending aorta measuring up to 4.6 cm. Long-term stable calcified nodule within the superior left lung, unchanged from 03/26/2021 radiographs and seen to represent anterior pleural calcification on 03/22/2022 CT. No new acute airspace opacity is seen. No pleural effusion or pneumothorax. No significant change in chronic lower thoracic and upper lumbar compression deformities with augmentation cement. IMPRESSION: 1. No acute lung process identified. 2. Chronic lower thoracic and upper lumbar compression deformities. Electronically Signed   By: Yvonne Kendall M.D.   On: 11/01/2022 12:46    ASSESSMENT: Stage II Kappa chain myeloma.  PLAN:    Stage II kappa chain myeloma: (11:14 translocation, high risk) SPEP essentially negative and immunoglobulins are within normal limits.  Patient's kappa light chains have improved to greater than 5400 down to 39.1.  Recently they trended up to 57.0, today's results pending.   Cycle 1 included Velcade and Revlimid, but given his declining performance status at the time and difficulty to get into clinic this was transitioned to Revlimid only.  Given persistent neutropenia, Revlimid was previously dose reduced to 15 mg daily, but patient is now tolerating an increased dose of 20 mg for 21 days with 7 days off.   He is also taking 20 mg dexamethasone weekly.  PET scan results from December 08, 2021 reviewed independently with no obvious evidence of progressive disease.  Patient initiated monthly Xgeva on August 11, 2021.  Patient has been instructed to reinitiate his Revlimid today.  Return to clinic in 3 weeks for laboratory work only and then in 4 weeks for further evaluation, continuation of Xgeva, and initiation of his next cycle of treatment.  Appreciate clinical pharmacy input.   Anemia: Nearly resolved.  Patient's most recent hemoglobin is 11.8. Right flank mass: MRI results from July 13, 2021 reviewed independently with no obvious pathology on his right flank, but did reveal several fractured ribs.  Continue narcotic regimen as above. Bone lesions: Continue Xgeva as above. Neutropenia: Resolved.  Continue Revlimid 20 mg as above. Thrombocytopenia: Chronic and unchanged.  Patient most recent platelet count is 72.  Reinitiate Revlimid as above.   Pain: Chronic and unchanged.  Patient only takes long-acting morphine 30 mg every 8 hours.  He also takes ibuprofen sparingly.  Previously he was instructed that if his platelets fall below 50 this would need to be discontinued.  Radiation oncology has determined no further treatments are needed at this time.   Transaminitis: Resolved.  Patient expressed understanding and was in agreement with this plan. He also understands that He can call clinic at any time with any questions, concerns, or complaints.     Cancer Staging  Kappa light chain myeloma (HCC) Staging form: Plasma Cell Myeloma  and Plasma Cell Disorders, AJCC 8th  Edition - Clinical stage from 04/16/2021: Albumin (g/dL): 3.8, ISS: Stage II, High-risk cytogenetics: Absent, LDH: Unknown - Signed by Lloyd Huger, MD on 04/16/2021 Albumin range (g/dL): Greater than or equal to 3.5 Cytogenetics: t(11;14) translocation Serum calcium level: Normal Serum creatinine level: Normal Bone disease on imaging: Present  Lloyd Huger, MD   11/16/2022 4:08 PM

## 2022-11-18 ENCOUNTER — Encounter: Payer: Self-pay | Admitting: Pharmacist

## 2022-11-18 ENCOUNTER — Telehealth: Payer: Self-pay

## 2022-11-18 NOTE — Telephone Encounter (Signed)
Raquel Sarna from [facility] called regarding  Revelimd prescription. Unable to fill for patient because patient survey is flagged in the [facility] portal. She asks if team can call [facility] line at [phone] to clear it and then call [employer] back to inform its completed at [phone]. Thanks

## 2022-11-18 NOTE — Telephone Encounter (Signed)
Called [facility] to have the flag removed and informed [employer]

## 2022-11-19 ENCOUNTER — Other Ambulatory Visit: Payer: Self-pay | Admitting: Pharmacist

## 2022-11-19 DIAGNOSIS — C9000 Multiple myeloma not having achieved remission: Secondary | ICD-10-CM

## 2022-11-19 MED ORDER — LIDOCAINE 5 % EX OINT: TOPICAL_OINTMENT | Freq: Three times a day (TID) | CUTANEOUS | 2 refills | Status: DC | PRN

## 2022-11-22 ENCOUNTER — Telehealth: Payer: Self-pay | Admitting: *Deleted

## 2022-11-22 NOTE — Progress Notes (Signed)
  Care Coordination   Note   11/22/2022 Name: Jack [name] MRN: [id number] DOB: [birth year]  Jack [name] is a 89 y.o. year old male who sees [name], MD for primary care. I reached out to Jack [name] by phone today to offer care coordination services.  [name] was given information about Care Coordination services today including:   The Care Coordination services include support from the care team which includes your Nurse Coordinator, Clinical Social Worker, or Pharmacist.  The Care Coordination team is here to help remove barriers to the health concerns and goals most important to you. Care Coordination services are voluntary, and the patient may decline or stop services at any time by request to their care team member.   Care Coordination Consent Status: Patient agreed to services and verbal consent obtained.   Follow up plan:  Telephone appointment with care coordination team member scheduled for:  12/15/22  Encounter Outcome:  Pt. Scheduled  [place]  Direct Dial: [phone]

## 2022-11-23 ENCOUNTER — Encounter: Payer: Self-pay | Admitting: Oncology

## 2022-11-24 ENCOUNTER — Encounter: Payer: Self-pay | Admitting: Oncology

## 2022-11-25 ENCOUNTER — Other Ambulatory Visit: Payer: Self-pay | Admitting: *Deleted

## 2022-11-26 ENCOUNTER — Other Ambulatory Visit: Payer: Self-pay | Admitting: *Deleted

## 2022-11-26 ENCOUNTER — Other Ambulatory Visit: Payer: Self-pay | Admitting: Hospice and Palliative Medicine

## 2022-11-26 ENCOUNTER — Encounter: Payer: Self-pay | Admitting: Oncology

## 2022-11-26 MED ORDER — MORPHINE SULFATE ER 30 MG PO TBCR: 30.0000 mg | EXTENDED_RELEASE_TABLET | Freq: Three times a day (TID) | ORAL | 0 refills | Status: DC

## 2022-12-01 DIAGNOSIS — C9000 Multiple myeloma not having achieved remission: Secondary | ICD-10-CM | POA: Diagnosis not present

## 2022-12-01 DIAGNOSIS — J9601 Acute respiratory failure with hypoxia: Secondary | ICD-10-CM | POA: Diagnosis not present

## 2022-12-01 DIAGNOSIS — M6281 Muscle weakness (generalized): Secondary | ICD-10-CM | POA: Diagnosis not present

## 2022-12-01 DIAGNOSIS — S32592D Other specified fracture of left pubis, subsequent encounter for fracture with routine healing: Secondary | ICD-10-CM | POA: Diagnosis not present

## 2022-12-01 DIAGNOSIS — R2689 Other abnormalities of gait and mobility: Secondary | ICD-10-CM | POA: Diagnosis not present

## 2022-12-05 DIAGNOSIS — S32592D Other specified fracture of left pubis, subsequent encounter for fracture with routine healing: Secondary | ICD-10-CM | POA: Diagnosis not present

## 2022-12-05 DIAGNOSIS — J9601 Acute respiratory failure with hypoxia: Secondary | ICD-10-CM | POA: Diagnosis not present

## 2022-12-05 DIAGNOSIS — C9000 Multiple myeloma not having achieved remission: Secondary | ICD-10-CM | POA: Diagnosis not present

## 2022-12-05 DIAGNOSIS — M6281 Muscle weakness (generalized): Secondary | ICD-10-CM | POA: Diagnosis not present

## 2022-12-05 DIAGNOSIS — R2689 Other abnormalities of gait and mobility: Secondary | ICD-10-CM | POA: Diagnosis not present

## 2022-12-06 ENCOUNTER — Other Ambulatory Visit: Payer: Self-pay | Admitting: *Deleted

## 2022-12-06 DIAGNOSIS — C9000 Multiple myeloma not having achieved remission: Secondary | ICD-10-CM

## 2022-12-07 ENCOUNTER — Inpatient Hospital Stay: Payer: PPO

## 2022-12-07 DIAGNOSIS — C9000 Multiple myeloma not having achieved remission: Secondary | ICD-10-CM | POA: Diagnosis not present

## 2022-12-07 DIAGNOSIS — Z79899 Other long term (current) drug therapy: Secondary | ICD-10-CM | POA: Diagnosis not present

## 2022-12-07 LAB — CBC WITH DIFFERENTIAL/PLATELET
Abs Immature Granulocytes: 0.01 10*3/uL (ref 0.00–0.07)
Basophils Absolute: 0 10*3/uL (ref 0.0–0.1)
Basophils Relative: 1 %
Eosinophils Absolute: 0.1 10*3/uL (ref 0.0–0.5)
Eosinophils Relative: 5 %
HCT: 39.3 % (ref 39.0–52.0)
Hemoglobin: 12.4 g/dL — ABNORMAL LOW (ref 13.0–17.0)
Immature Granulocytes: 0 %
Lymphocytes Relative: 39 %
Lymphs Abs: 0.9 10*3/uL (ref 0.7–4.0)
MCH: 31.8 pg (ref 26.0–34.0)
MCHC: 31.6 g/dL (ref 30.0–36.0)
MCV: 100.8 fL — ABNORMAL HIGH (ref 80.0–100.0)
Monocytes Absolute: 0.3 10*3/uL (ref 0.1–1.0)
Monocytes Relative: 13 %
Neutro Abs: 1 10*3/uL — ABNORMAL LOW (ref 1.7–7.7)
Neutrophils Relative %: 42 %
Platelets: 85 10*3/uL — ABNORMAL LOW (ref 150–400)
RBC: 3.9 MIL/uL — ABNORMAL LOW (ref 4.22–5.81)
RDW: 17.9 % — ABNORMAL HIGH (ref 11.5–15.5)
WBC: 2.4 10*3/uL — ABNORMAL LOW (ref 4.0–10.5)
nRBC: 0 % (ref 0.0–0.2)

## 2022-12-07 LAB — COMPREHENSIVE METABOLIC PANEL
ALT: 11 U/L (ref 0–44)
AST: 20 U/L (ref 15–41)
Albumin: 3.6 g/dL (ref 3.5–5.0)
Alkaline Phosphatase: 68 U/L (ref 38–126)
Anion gap: 6 (ref 5–15)
BUN: 22 mg/dL (ref 8–23)
CO2: 28 mmol/L (ref 22–32)
Calcium: 8.8 mg/dL — ABNORMAL LOW (ref 8.9–10.3)
Chloride: 100 mmol/L (ref 98–111)
Creatinine, Ser: 1.4 mg/dL — ABNORMAL HIGH (ref 0.61–1.24)
GFR, Estimated: 49 mL/min — ABNORMAL LOW (ref >60)
Glucose, Bld: 133 mg/dL — ABNORMAL HIGH (ref 70–99)
Potassium: 3.9 mmol/L (ref 3.5–5.1)
Sodium: 134 mmol/L — ABNORMAL LOW (ref 135–145)
Total Bilirubin: 0.7 mg/dL (ref 0.3–1.2)
Total Protein: 6.5 g/dL (ref 6.5–8.1)

## 2022-12-08 LAB — KAPPA/LAMBDA LIGHT CHAINS
Kappa free light chain: 105.2 mg/L — ABNORMAL HIGH (ref 3.3–19.4)
Kappa, lambda light chain ratio: 4.63 — ABNORMAL HIGH (ref 0.26–1.65)
Lambda free light chains: 22.7 mg/L (ref 5.7–26.3)

## 2022-12-09 ENCOUNTER — Encounter: Payer: Self-pay | Admitting: Oncology

## 2022-12-09 MED ORDER — LENALIDOMIDE 20 MG PO CAPS: 20.0000 mg | ORAL_CAPSULE | Freq: Every day | ORAL | 0 refills | Status: DC

## 2022-12-14 ENCOUNTER — Inpatient Hospital Stay (HOSPITAL_BASED_OUTPATIENT_CLINIC_OR_DEPARTMENT_OTHER): Payer: PPO | Admitting: Hospice and Palliative Medicine

## 2022-12-14 ENCOUNTER — Inpatient Hospital Stay: Payer: PPO | Admitting: Pharmacist

## 2022-12-14 ENCOUNTER — Inpatient Hospital Stay (HOSPITAL_BASED_OUTPATIENT_CLINIC_OR_DEPARTMENT_OTHER): Payer: PPO | Admitting: Oncology

## 2022-12-14 ENCOUNTER — Encounter: Payer: Self-pay | Admitting: Oncology

## 2022-12-14 ENCOUNTER — Inpatient Hospital Stay: Payer: PPO

## 2022-12-14 ENCOUNTER — Inpatient Hospital Stay: Payer: PPO | Attending: Oncology

## 2022-12-14 VITALS — BP 131/71 | HR 48 | Temp 97.9°F | Resp 18 | Ht 68.0 in | Wt 170.0 lb

## 2022-12-14 DIAGNOSIS — C9000 Multiple myeloma not having achieved remission: Secondary | ICD-10-CM

## 2022-12-14 DIAGNOSIS — Z79899 Other long term (current) drug therapy: Secondary | ICD-10-CM | POA: Diagnosis not present

## 2022-12-14 DIAGNOSIS — Z515 Encounter for palliative care: Secondary | ICD-10-CM | POA: Diagnosis not present

## 2022-12-14 DIAGNOSIS — G893 Neoplasm related pain (acute) (chronic): Secondary | ICD-10-CM

## 2022-12-14 LAB — COMPREHENSIVE METABOLIC PANEL
ALT: 10 U/L (ref 0–44)
AST: 23 U/L (ref 15–41)
Albumin: 3.5 g/dL (ref 3.5–5.0)
Alkaline Phosphatase: 59 U/L (ref 38–126)
Anion gap: 6 (ref 5–15)
BUN: 25 mg/dL — ABNORMAL HIGH (ref 8–23)
CO2: 26 mmol/L (ref 22–32)
Calcium: 8.7 mg/dL — ABNORMAL LOW (ref 8.9–10.3)
Chloride: 104 mmol/L (ref 98–111)
Creatinine, Ser: 1.43 mg/dL — ABNORMAL HIGH (ref 0.61–1.24)
GFR, Estimated: 47 mL/min — ABNORMAL LOW (ref >60)
Glucose, Bld: 138 mg/dL — ABNORMAL HIGH (ref 70–99)
Potassium: 3.9 mmol/L (ref 3.5–5.1)
Sodium: 136 mmol/L (ref 135–145)
Total Bilirubin: 0.5 mg/dL (ref 0.3–1.2)
Total Protein: 6.2 g/dL — ABNORMAL LOW (ref 6.5–8.1)

## 2022-12-14 LAB — CBC WITH DIFFERENTIAL/PLATELET
Abs Immature Granulocytes: 0.01 10*3/uL (ref 0.00–0.07)
Basophils Absolute: 0 10*3/uL (ref 0.0–0.1)
Basophils Relative: 1 %
Eosinophils Absolute: 0.1 10*3/uL (ref 0.0–0.5)
Eosinophils Relative: 2 %
HCT: 35.7 % — ABNORMAL LOW (ref 39.0–52.0)
Hemoglobin: 11.3 g/dL — ABNORMAL LOW (ref 13.0–17.0)
Immature Granulocytes: 0 %
Lymphocytes Relative: 34 %
Lymphs Abs: 1 10*3/uL (ref 0.7–4.0)
MCH: 32.2 pg (ref 26.0–34.0)
MCHC: 31.7 g/dL (ref 30.0–36.0)
MCV: 101.7 fL — ABNORMAL HIGH (ref 80.0–100.0)
Monocytes Absolute: 0.3 10*3/uL (ref 0.1–1.0)
Monocytes Relative: 11 %
Neutro Abs: 1.5 10*3/uL — ABNORMAL LOW (ref 1.7–7.7)
Neutrophils Relative %: 52 %
Platelets: 80 10*3/uL — ABNORMAL LOW (ref 150–400)
RBC: 3.51 MIL/uL — ABNORMAL LOW (ref 4.22–5.81)
RDW: 17.3 % — ABNORMAL HIGH (ref 11.5–15.5)
WBC: 3 10*3/uL — ABNORMAL LOW (ref 4.0–10.5)
nRBC: 0 % (ref 0.0–0.2)

## 2022-12-14 MED ORDER — OXYCODONE HCL 5 MG PO TABS: 5.0000 mg | ORAL_TABLET | ORAL | 0 refills | Status: DC | PRN

## 2022-12-14 MED ORDER — DENOSUMAB 120 MG/1.7ML ~~LOC~~ SOLN
120.0000 mg | Freq: Once | SUBCUTANEOUS | Status: AC
  Administered 2022-12-14: 120 mg via SUBCUTANEOUS
  Filled 2022-12-14: qty 1.7

## 2022-12-14 NOTE — Progress Notes (Signed)
[place]  Telephone:([phone]) [phone] Fax:(336) [phone]  ID: Jack [name] OB: October 06, 1932  MR#: [id number]  [id number]  Patient Care Team: [name], MD as PCP - General (Family Medicine) End, [name], MD as PCP - Cardiology (Cardiology) [name], MD (Endocrinology) Lloyd Huger, MD as Consulting Physician (Hematology and Oncology)   CHIEF COMPLAINT: Stage II kappa chain myeloma.  INTERVAL HISTORY: Patient returns to clinic today for further evaluation and continuation of Revlimid and Xgeva.  He continues to have significant back pain, but otherwise feels well. He has no neurologic complaints.  He denies any fevers.  He has a good appetite and denies weight loss.  He denies chest pain, shortness of breath, or hemoptysis.  He denies any nausea, vomiting, constipation, or diarrhea.  He has no urinary complaints.  Patient offers no further specific complaints today.  REVIEW OF SYSTEMS:   Review of Systems  Constitutional: Negative.  Negative for fever and malaise/fatigue.  Respiratory: Negative.  Negative for cough, hemoptysis and shortness of breath.   Cardiovascular: Negative.  Negative for chest pain and leg swelling.  Gastrointestinal: Negative.  Negative for abdominal pain.  Genitourinary: Negative.  Negative for dysuria and flank pain.  Musculoskeletal:  Positive for back pain. Negative for falls and joint pain.  Skin: Negative.  Negative for rash.  Neurological: Negative.  Negative for dizziness, focal weakness, weakness and headaches.  Psychiatric/Behavioral: Negative.  The patient is not nervous/anxious.     As per HPI. Otherwise, a complete review of systems is negative.  PAST MEDICAL HISTORY: Past Medical History:  Diagnosis Date   Ascending aortic aneurysm    a. 12/2020 Echo: Asc Ao 48mm, Ao root 50mm. b. 03/2022 Asc Ao 4.6 cm (4.5 cm in 2019)   CKD (chronic kidney disease), stage III    Diastolic dysfunction    a.  12/2020 Echo: EF 50-55%, no rwma, mild LVH, GrI DD, nl RV fxn, mild AI. Asc Ao 30mm, Ao root 20mm.   Elevated prostate specific antigen (PSA)    has been 7 for a year    GERD (gastroesophageal reflux disease)    History of colon polyps 2008   [facility],    History of kidney stones    Hyperlipidemia    Hypertension    Myeloma    PAF (paroxysmal atrial fibrillation)    a. 01/2021-->Eliquis (CHA2DS2VASc = 3-4).   Pain    Prostate hypertrophy    diagnosed at age 18 due to hematospermia    PAST SURGICAL HISTORY: Past Surgical History:  Procedure Laterality Date   CATARACT EXTRACTION W/PHACO Left 01/10/2018   Procedure: CATARACT EXTRACTION PHACO AND INTRAOCULAR LENS PLACEMENT ([place]);  Surgeon: Birder Robson, MD;  Location: [facility];  Service: Ophthalmology;  Laterality: Left;  [name] 00:24.8 AP% 14.9 CDE 3.68 Fluid pack lot # [id number]   CATARACT EXTRACTION W/PHACO Right 01/25/2018   Procedure: CATARACT EXTRACTION PHACO AND INTRAOCULAR LENS PLACEMENT ([facility]);  Surgeon: Birder Robson, MD;  Location: [facility];  Service: Ophthalmology;  Laterality: Right;  [name] 00:42 AP% 10.8 CDE 4.59 Fluid pack lot # [id number]   COLON SURGERY     CYSTOSCOPY W/ URETERAL STENT PLACEMENT Right 10/16/2017   Procedure: right  URETERAL STENT PLACEMENT,cystoscopy bilateral stent removal,rretrograde;  Surgeon: Abbie Sons, MD;  Location: [facility];  Service: Urology;  Laterality: Right;   CYSTOSCOPY/URETEROSCOPY/HOLMIUM LASER/STENT PLACEMENT Right 12/16/2020   Procedure: CYSTOSCOPY/URETEROSCOPY/HOLMIUM LASER/STENT PLACEMENT;  Surgeon: Abbie Sons, MD;  Location: [facility];  Service: Urology;  Laterality:  Right;   EXTRACORPOREAL SHOCK WAVE LITHOTRIPSY Right 12/11/2020   Procedure: EXTRACORPOREAL SHOCK WAVE LITHOTRIPSY (ESWL);  Surgeon: Abbie Sons, MD;  Location: [facility];  Service: Urology;  Laterality: Right;   IR BONE TUMOR(S)RF ABLATION  04/16/2022   IR KYPHO EA ADDL LEVEL THORACIC OR LUMBAR  02/02/2021    IR KYPHO LUMBAR INC FX REDUCE BONE BX UNI/BIL CANNULATION INC/IMAGING  02/02/2021   IR KYPHO THORACIC WITH BONE BIOPSY  04/15/2022   KIDNEY STONE SURGERY     KYPHOPLASTY N/A 03/12/2021   Procedure: Hewitt Shorts, L3;  Surgeon: Hessie Knows, MD;  Location: [facility];  Service: Orthopedics;  Laterality: N/A;   RESECTION SOFT TISSUE TUMOR LEG / ANKLE RADICAL  jan 2009   [facility],  right thigh/knee , nonmalignant   SMALL INTESTINE SURGERY  1946   implaed on picket fence, punctured stomach   TONSILLECTOMY      FAMILY HISTORY: Family History  Problem Relation Age of Onset   Hypertension Father    Hyperlipidemia Father    Cancer Sister        thyroid - dx in late 20's    ADVANCED DIRECTIVES (Y/N):  N  HEALTH MAINTENANCE: Social History   Tobacco Use   Smoking status: Former    Types: Cigarettes    Quit date: 07/05/1965    Years since quitting: 57.4   Smokeless tobacco: Never  Vaping Use   Vaping Use: Never used  Substance Use Topics   Alcohol use: Not Currently    Alcohol/week: 1.0 standard drink of alcohol    Types: 1 Standard drinks or equivalent per week    Comment: occassionaly   Drug use: No     Colonoscopy:  PAP:  Bone density:  Lipid panel:  Allergies  Allergen Reactions   Quinolones     Aortic dilation contraindicates FQ   Amlodipine Swelling and Other (See Comments)    LE edema   Azithromycin Nausea And Vomiting   Lipitor [Atorvastatin]     Muscle pain in RT Leg    Lisinopril Cough   Zetia [Ezetimibe]     Pain in legs     Current Outpatient Medications  Medication Sig Dispense Refill   acetaminophen (TYLENOL) 500 MG tablet Take 500-1,000 mg by mouth every 8 (eight) hours as needed for moderate pain.     albuterol (VENTOLIN HFA) 108 (90 Base) MCG/ACT inhaler Inhale into the lungs.     Ascorbic Acid (VITAMIN C) 1000 MG tablet Take 1,000 mg by mouth in the morning and at bedtime.     Calcium Carb-Cholecalciferol (OYSTER SHELL CALCIUM W/D) 500-5 MG-MCG  TABS Take 1 tablet by mouth in the morning and at bedtime.     carvedilol (COREG) 12.5 MG tablet Take 1 tablet (12.5 mg total) by mouth 2 (two) times daily. 180 tablet 3   dexamethasone (DECADRON) 4 MG tablet TAKE 5 TABLETS BY MOUTH ONE TIME PER WEEK 20 tablet 4   ELIQUIS 5 MG TABS tablet TAKE 1 TABLET BY MOUTH TWICE A DAY 180 tablet 0   feeding supplement (ENSURE ENLIVE / ENSURE PLUS) LIQD Take 237 mLs by mouth 3 (three) times daily between meals. (Patient taking differently: Take 237 mLs by mouth daily with lunch.) 237 mL 12   gabapentin (NEURONTIN) 300 MG capsule Take 2 capsules (600 mg total) by mouth 3 (three) times daily. 180 capsule 2   ipratropium-albuterol (DUONEB) 0.5-2.5 (3) MG/3ML SOLN Take 3 mLs by nebulization every 4 (four) hours as needed. 360 mL 0  lenalidomide (REVLIMID) 20 MG capsule Take 1 capsule (20 mg total) by mouth daily. Take for 21 days, then hold for 7 days. Repeat every 28 days 21 capsule 0   lidocaine (XYLOCAINE) 5 % ointment Apply topically 3 (three) times daily as needed for mild pain or moderate pain. 50 g 2   Multiple Vitamin (MULTIVITAMIN WITH MINERALS) TABS tablet Take 1 tablet by mouth daily.     Multiple Vitamins-Minerals (PRESERVISION AREDS PO) Take 1 capsule by mouth in the morning and at bedtime.     polyethylene glycol (MIRALAX / GLYCOLAX) 17 g packet Take 17 g by mouth 2 (two) times daily.  0   potassium citrate (UROCIT-K) 10 MEQ (1080 MG) SR tablet TAKE 1 TABLET (10 MEQ TOTAL) BY MOUTH 3 (THREE) TIMES DAILY WITH MEALS. 270 tablet 2   senna-docusate (SENOKOT-S) 8.6-50 MG tablet Take 1 tablet by mouth 2 (two) times daily. 30 tablet 0   morphine (MS CONTIN) 30 MG 12 hr tablet Take 1 tablet (30 mg total) by mouth every 8 (eight) hours. (Patient not taking: Reported on 12/14/2022) 90 tablet 0   mupirocin ointment (BACTROBAN) 2 % Apply 1 Application topically 2 (two) times daily. (Patient not taking: Reported on 11/01/2022) 22 g 0   naloxone (NARCAN) nasal spray 4  mg/0.1 mL SPRAY 1 SPRAY INTO ONE NOSTRIL AS DIRECTED FOR OPIOID OVERDOSE (TURN PERSON ON SIDE AFTER DOSE. IF NO RESPONSE IN 2-3 MINUTES OR PERSON RESPONDS BUT RELAPSES, REPEAT USING A NEW SPRAY DEVICE AND SPRAY INTO THE OTHER NOSTRIL. CALL 911 AFTER USE.) * EMERGENCY USE ONLY * (Patient not taking: Reported on 11/01/2022) 1 each 0   omeprazole (PRILOSEC) 20 MG capsule Take 1 capsule (20 mg total) by mouth daily. (Patient not taking: Reported on 11/01/2022) 30 capsule 11   No current facility-administered medications for this visit.   Facility-Administered Medications Ordered in Other Visits  Medication Dose Route Frequency Provider Last Rate Last Admin   denosumab (XGEVA) injection 120 mg  120 mg Subcutaneous Once Lloyd Huger, MD        OBJECTIVE: Vitals:   12/14/22 0935  BP: 131/71  Pulse: (!) 48  Resp: 18  Temp: 97.9 F (36.6 C)  SpO2: 99%        Body mass index is 25.85 kg/m.    ECOG FS:1 - Symptomatic but completely ambulatory  General: Well-developed, well-nourished, no acute distress. Eyes: Pink conjunctiva, anicteric sclera. HEENT: Normocephalic, moist mucous membranes. Lungs: No audible wheezing or coughing. Heart: Regular rate and rhythm. Abdomen: Soft, nontender, no obvious distention. Musculoskeletal: No edema, cyanosis, or clubbing. Neuro: Alert, answering all questions appropriately. Cranial nerves grossly intact. Skin: No rashes or petechiae noted. Psych: Normal affect.  LAB RESULTS:  Lab Results  Component Value Date   NA 136 12/14/2022   K 3.9 12/14/2022   CL 104 12/14/2022   CO2 26 12/14/2022   GLUCOSE 138 (H) 12/14/2022   BUN 25 (H) 12/14/2022   CREATININE 1.43 (H) 12/14/2022   CALCIUM 8.7 (L) 12/14/2022   PROT 6.2 (L) 12/14/2022   ALBUMIN 3.5 12/14/2022   AST 23 12/14/2022   ALT 10 12/14/2022   ALKPHOS 59 12/14/2022   BILITOT 0.5 12/14/2022   GFRNONAA 47 (L) 12/14/2022   GFRAA 29 (L) 10/16/2017    Lab Results  Component Value Date    WBC 3.0 (L) 12/14/2022   NEUTROABS 1.5 (L) 12/14/2022   HGB 11.3 (L) 12/14/2022   HCT 35.7 (L) 12/14/2022   MCV 101.7 (H)  12/14/2022   PLT 80 (L) 12/14/2022     STUDIES: No results found.  ASSESSMENT: Stage II Kappa chain myeloma.  PLAN:    Stage II kappa chain myeloma: (11:14 translocation, high risk) SPEP essentially negative and immunoglobulins are within normal limits.  Patient's kappa light chains initially improved to greater than 5400 down to 39.1.  They trended up briefly during hospitalization and not taking Revlimid, but now trending back down and the most recent result was 105.2.  Cycle 1 included Velcade and Revlimid, but given his declining performance status at the time and difficulty to get into clinic this was transitioned to Revlimid only.  Given persistent neutropenia, Revlimid was previously dose reduced to 15 mg daily, but patient is now tolerating an increased dose of 20 mg for 21 days with 7 days off.   He is also taking 20 mg dexamethasone weekly.  PET scan results from December 08, 2021 reviewed independently with no obvious evidence of progressive disease.  Patient initiated monthly Xgeva on August 11, 2021.  Patient has been instructed to reinitiate his Revlimid today.  Return to clinic in 4 weeks for repeat laboratory work, further evaluation, and continuation of treatment.  Appreciate clinical pharmacy input.   Anemia: Chronic and unchanged.  Patient's hemoglobin is 11.3. Right flank mass: MRI results from July 13, 2021 reviewed independently with no obvious pathology on his right flank, but did reveal several fractured ribs.  Continue narcotic regimen as above. Bone lesions: Continue Xgeva as above. Neutropenia: Essentially resolved.  Patient's ANC is 1.5 today.  Continue 20 mg Revlimid as above.   Thrombocytopenia: Chronic and unchanged.  Patient's platelet count is 80 today. Pain: Patient reports his knee might be worse.  Patient only takes long-acting morphine  30 mg every 8 hours.  He also takes Tylenol ibuprofen sparingly.  Previously he was instructed that if his platelets fall below 50 this would need to be discontinued.  Radiation oncology has determined no further treatments are needed at this time.  Appreciate palliative care input. Transaminitis: Resolved.  Patient expressed understanding and was in agreement with this plan. He also understands that He can call clinic at any time with any questions, concerns, or complaints.     Cancer Staging  Kappa light chain myeloma Staging form: Plasma Cell Myeloma and Plasma Cell Disorders, AJCC 8th Edition - Clinical stage from 04/16/2021: Albumin (g/dL): 3.8, ISS: Stage II, High-risk cytogenetics: Absent, LDH: Unknown - Signed by Lloyd Huger, MD on 04/16/2021 Albumin range (g/dL): Greater than or equal to 3.5 Cytogenetics: t(11;14) translocation Serum calcium level: Normal Serum creatinine level: Normal Bone disease on imaging: Present  Lloyd Huger, MD   12/14/2022 10:29 AM

## 2022-12-14 NOTE — Progress Notes (Signed)
Virtual Visit via Telephone Note  I connected with [name] on 12/14/22 at  2:00 PM EDT by telephone and verified that I am speaking with the correct person using two identifiers.  Location: Patient: Home Provider: Clinic   I discussed the limitations, risks, security and privacy concerns of performing an evaluation and management service by telephone and the availability of in person appointments. I also discussed with the patient that there may be a patient responsible charge related to this service. The patient expressed understanding and agreed to proceed.   History of Present Illness: MALIK PRASSE is a 89 y.o. male with multiple medical problems including hypertension, CKD stage IIIb, A. fib on Eliquis, and anemia.  Patient was hospitalized 01/02/2021 to 01/07/2021 with T12 compression fracture with recommendation for conservative management including use of a TLSO brace.  Patient was hospitalized again 01/29/2021 to 02/07/2021 with recurrent back pain and MR showing T12 and new L1 compression fracture.  Patient underwent T12/L1 kyphoplasty on 02/02/2021.  There was also suspicion of a left fifth rib fracture, which occurred while transitioning from the stretcher for his kyphoplasty.  Patient had mild hypercalcemia with SPEP and UPEP concerning for possible myeloma.  Patient was [facility] to [facility] for work[facility]up.  He was readmitted 03/11/2021 - 03/17/2021 with worsening back pain.  CT revealed new compression fractures of L2 and L3.  Patient underwent kyphoplasty on 6/30.  He was hospitalized again 03/26/2021-04/07/2021 with severe back/flank pain.  MRI revealed T10 pathologic compression fracture.  He was started on treatment for multiple myeloma with dexamethasone/Velcade and XRT to spine.   Patient is s/p T11 kyphoplasty and ablation.    Observations/Objective: Spoke with patient and wife by phone.    Patient has had persistent upper/mid back pain in similar location to his  previous kyphoplasty.  He has been taking MS Contin 30 mg every 8 hours but finds that he now has intermittent breakthrough pain.  Discussed restarting him on short acting opioid such as oxycodone and both wife and patient verbalized agreement with this plan.  Unable to start NSAIDs due to CKD.  Could consider reimaging if needed, although pain is likely secondary to known myeloma.  Myeloma panel is improving.  Assessment and Plan: Chronic back pain secondary to multiple myeloma and history of recurrent/chronic compression fractures.  S/p XRT.  Continue MS Contin 30 mg every 8 hours.  Start oxycodone 5 to 10 mg every 4 hours as needed for breakthrough pain.  Neuropathy -Continue gabapentin.  Follow Up Instructions: Follow-up telephone visit 1 to 2 months   I discussed the assessment and treatment plan with the patient. The patient was provided an opportunity to ask questions and all were answered. The patient agreed with the plan and demonstrated an understanding of the instructions.   The patient was advised to call back or seek an in-person evaluation if the symptoms worsen or if the condition fails to improve as anticipated.  I provided 10 minutes of non-face-to-face time during this encounter.   Irean Hong, NP

## 2022-12-14 NOTE — Progress Notes (Signed)
[place]  Telephone:([phone] [phone]  Patient Care Team: [name], MD as PCP - General (Family Medicine) End, [name], MD as PCP - Cardiology (Cardiology) [name], MD (Endocrinology) Lloyd Huger, MD as Consulting Physician (Hematology and Oncology)   Name of the patient: Jack [name]  [id number]  [birth year]   Date of visit: 12/14/22  HPI: Patient is a 89 y.o. male with newly diagnosed multiple myeloma. Currently treated with Revlimid (lenalidomide) and dexamethasone. Patient was initiated on an all oral regimen because he was unable to physically make it into clinic at the time treatment due to his disease/performance status. His status improved he was able to come back to inperson appts on 07/14/21.  Reason for Consult: Oral chemotherapy follow-up for lenalidomide therapy.   PAST MEDICAL HISTORY: Past Medical History:  Diagnosis Date   Ascending aortic aneurysm    a. 12/2020 Echo: Asc Ao 37mm, Ao root 77mm. b. 03/2022 Asc Ao 4.6 cm (4.5 cm in 2019)   CKD (chronic kidney disease), stage III    Diastolic dysfunction    a. 12/2020 Echo: EF 50-55%, no rwma, mild LVH, GrI DD, nl RV fxn, mild AI. Asc Ao 57mm, Ao root 41mm.   Elevated prostate specific antigen (PSA)    has been 7 for a year    GERD (gastroesophageal reflux disease)    History of colon polyps 2008   [facility],    History of kidney stones    Hyperlipidemia    Hypertension    Myeloma    PAF (paroxysmal atrial fibrillation)    a. 01/2021-->Eliquis (CHA2DS2VASc = 3-4).   Pain    Prostate hypertrophy    diagnosed at age 89 due to hematospermia    HEMATOLOGY/ONCOLOGY HISTORY:  Oncology History  Kappa light chain myeloma  03/12/2021 Initial Diagnosis   Kappa light chain myeloma ([place])   03/27/2021 - 04/06/2021 Chemotherapy   Patient is on Treatment Plan : MYELOMA NON-TRANSPLANT CANDIDATES VRd SQ q21d      04/16/2021 Cancer  Staging   Staging form: Plasma Cell Myeloma and Plasma Cell Disorders, AJCC 8th Edition - Clinical stage from 04/16/2021: Albumin (g/dL): 3.8, ISS: Stage II, High-risk cytogenetics: Absent, LDH: Unknown - Signed by Lloyd Huger, MD on 04/16/2021 Albumin range (g/dL): Greater than or equal to 3.5 Cytogenetics: t(11;14) translocation Serum calcium level: Normal Serum creatinine level: Normal Bone disease on imaging: Present     ALLERGIES:  is allergic to quinolones, amlodipine, azithromycin, lipitor [atorvastatin], lisinopril, and zetia [ezetimibe].  MEDICATIONS:  Current Outpatient Medications  Medication Sig Dispense Refill   acetaminophen (TYLENOL) 500 MG tablet Take 500-1,000 mg by mouth every 8 (eight) hours as needed for moderate pain.     albuterol (VENTOLIN HFA) 108 (90 Base) MCG/ACT inhaler Inhale into the lungs.     Ascorbic Acid (VITAMIN C) 1000 MG tablet Take 1,000 mg by mouth in the morning and at bedtime.     Calcium Carb-Cholecalciferol (OYSTER SHELL CALCIUM W/D) 500-5 MG-MCG TABS Take 1 tablet by mouth in the morning and at bedtime.     carvedilol (COREG) 12.5 MG tablet Take 1 tablet (12.5 mg total) by mouth 2 (two) times daily. 180 tablet 3   dexamethasone (DECADRON) 4 MG tablet TAKE 5 TABLETS BY MOUTH ONE TIME PER WEEK 20 tablet 4   ELIQUIS 5 MG TABS tablet TAKE 1 TABLET BY MOUTH TWICE A DAY 180 tablet 0   feeding supplement (ENSURE ENLIVE / ENSURE PLUS) LIQD  Take 237 mLs by mouth 3 (three) times daily between meals. (Patient taking differently: Take 237 mLs by mouth daily with lunch.) 237 mL 12   gabapentin (NEURONTIN) 300 MG capsule Take 2 capsules (600 mg total) by mouth 3 (three) times daily. 180 capsule 2   ipratropium-albuterol (DUONEB) 0.5-2.5 (3) MG/3ML SOLN Take 3 mLs by nebulization every 4 (four) hours as needed. 360 mL 0   lenalidomide (REVLIMID) 20 MG capsule Take 1 capsule (20 mg total) by mouth daily. Take for 21 days, then hold for 7 days. Repeat every 28  days 21 capsule 0   lidocaine (XYLOCAINE) 5 % ointment Apply topically 3 (three) times daily as needed for mild pain or moderate pain. 50 g 2   morphine (MS CONTIN) 30 MG 12 hr tablet Take 1 tablet (30 mg total) by mouth every 8 (eight) hours. (Patient not taking: Reported on 12/14/2022) 90 tablet 0   Multiple Vitamin (MULTIVITAMIN WITH MINERALS) TABS tablet Take 1 tablet by mouth daily.     Multiple Vitamins-Minerals (PRESERVISION AREDS PO) Take 1 capsule by mouth in the morning and at bedtime.     mupirocin ointment (BACTROBAN) 2 % Apply 1 Application topically 2 (two) times daily. (Patient not taking: Reported on 11/01/2022) 22 g 0   naloxone (NARCAN) nasal spray 4 mg/0.1 mL SPRAY 1 SPRAY INTO ONE NOSTRIL AS DIRECTED FOR OPIOID OVERDOSE (TURN PERSON ON SIDE AFTER DOSE. IF NO RESPONSE IN 2-3 MINUTES OR PERSON RESPONDS BUT RELAPSES, REPEAT USING A NEW SPRAY DEVICE AND SPRAY INTO THE OTHER NOSTRIL. CALL 911 AFTER USE.) * EMERGENCY USE ONLY * (Patient not taking: Reported on 11/01/2022) 1 each 0   omeprazole (PRILOSEC) 20 MG capsule Take 1 capsule (20 mg total) by mouth daily. (Patient not taking: Reported on 11/01/2022) 30 capsule 11   polyethylene glycol (MIRALAX / GLYCOLAX) 17 g packet Take 17 g by mouth 2 (two) times daily.  0   potassium citrate (UROCIT-K) 10 MEQ (1080 MG) SR tablet TAKE 1 TABLET (10 MEQ TOTAL) BY MOUTH 3 (THREE) TIMES DAILY WITH MEALS. 270 tablet 2   senna-docusate (SENOKOT-S) 8.6-50 MG tablet Take 1 tablet by mouth 2 (two) times daily. 30 tablet 0   No current facility-administered medications for this visit.    VITAL SIGNS: There were no vitals taken for this visit. There were no vitals filed for this visit.  Estimated body mass index is 25.85 kg/m as calculated from the following:   Height as of an earlier encounter on 12/14/22: 5\' 8"  (1.727 m).   Weight as of an earlier encounter on 12/14/22: 77.1 kg (170 lb).  LABS: CBC:    Component Value Date/Time   WBC 3.0 (L)  12/14/2022 0922   HGB 11.3 (L) 12/14/2022 0922   HGB 11.0 (L) 02/24/2021 1014   HCT 35.7 (L) 12/14/2022 0922   HCT 35.3 (L) 02/24/2021 1014   PLT 80 (L) 12/14/2022 0922   PLT 249 02/24/2021 1014   MCV 101.7 (H) 12/14/2022 0922   MCV 96 02/24/2021 1014   MCV 90 01/24/2013 0819   NEUTROABS 1.5 (L) 12/14/2022 0922   NEUTROABS 7.4 (H) 01/24/2013 0819   LYMPHSABS 1.0 12/14/2022 0922   LYMPHSABS 1.2 01/24/2013 0819   MONOABS 0.3 12/14/2022 0922   MONOABS 0.3 01/24/2013 0819   EOSABS 0.1 12/14/2022 0922   EOSABS 0.0 01/24/2013 0819   BASOSABS 0.0 12/14/2022 0922   BASOSABS 0.0 01/24/2013 0819   Comprehensive Metabolic Panel:    Component Value Date/Time  NA 136 12/14/2022 0922   NA 136 02/24/2021 1014   NA 138 01/24/2013 0819   K 3.9 12/14/2022 0922   K 3.8 01/24/2013 0819   CL 104 12/14/2022 0922   CL 105 01/24/2013 0819   CO2 26 12/14/2022 0922   CO2 25 01/24/2013 0819   BUN 25 (H) 12/14/2022 0922   BUN 23 02/24/2021 1014   BUN 20 (H) 01/24/2013 0819   CREATININE 1.43 (H) 12/14/2022 0922   CREATININE 1.52 (H) 01/24/2013 0819   GLUCOSE 138 (H) 12/14/2022 0922   GLUCOSE 171 (H) 01/24/2013 0819   CALCIUM 8.7 (L) 12/14/2022 0922   CALCIUM 10.4 (H) 02/19/2021 1229   AST 23 12/14/2022 0922   AST 18 01/24/2013 0819   ALT 10 12/14/2022 0922   ALT 15 01/24/2013 0819   ALKPHOS 59 12/14/2022 0922   ALKPHOS 74 01/24/2013 0819   BILITOT 0.5 12/14/2022 0922   BILITOT 0.4 02/24/2021 1014   BILITOT 0.5 01/24/2013 0819   PROT 6.2 (L) 12/14/2022 0922   PROT 6.2 02/24/2021 1014   PROT 6.3 (L) 01/24/2013 0819   ALBUMIN 3.5 12/14/2022 0922   ALBUMIN 4.5 02/24/2021 1014   ALBUMIN 3.6 01/24/2013 0819     Present during today's visit: Patient and his wife Jack [name]  Assessment and Plan: CBC/CMP reviewed, okay to continue with lenalidomide 20mg , 21 days on/7off and dexamethasone 20mg  weekly Patient okay to receive Xgeva today as scheduled Pain: patient reporting back pain has  increased over the last 2 months. Currently he is taking extended release morphine and acetaminophen for pain management. He has short acting prn morphine but he does not feel that this helps so he is not currently taking it.    Oral Chemotherapy Adherence: no reported missed doses.  No patient barriers to medication adherence identified.   New medications: none reported  Medication Access Issues: no issues, fills Revlimid at [facility], will restart lenalidomide today  Patient expressed understanding and was in agreement with this plan. He also understands that He can call clinic at any time with any questions, concerns, or complaints.   Follow-up plan: RTC in 4 weeks  Thank you for allowing me to participate in the care of this very pleasant patient.   Time Total: 15 mins  Visit consisted of counseling and education on dealing with issues of symptom management in the setting of serious and potentially life-threatening illness.Greater than 50%  of this time was spent counseling and coordinating care related to the above assessment and plan.  Signed by: Darl Pikes, PharmD, BCPS, Salley Slaughter, CPP Hematology/Oncology Clinical Pharmacist Practitioner [place]/DB/AP [facility] [phone]  12/14/2022 11:04 AM

## 2022-12-15 ENCOUNTER — Ambulatory Visit: Payer: Self-pay | Admitting: *Deleted

## 2022-12-15 ENCOUNTER — Encounter: Payer: Self-pay | Admitting: *Deleted

## 2022-12-15 LAB — KAPPA/LAMBDA LIGHT CHAINS
Kappa free light chain: 128 mg/L — ABNORMAL HIGH (ref 3.3–19.4)
Kappa, lambda light chain ratio: 5.18 — ABNORMAL HIGH (ref 0.26–1.65)
Lambda free light chains: 24.7 mg/L (ref 5.7–26.3)

## 2022-12-15 NOTE — Patient Outreach (Signed)
  Care Coordination   Follow Up Visit Note   12/15/2022 Name: Jack [name] MRN: [id number] DOB: [birth year]  Jack [name] is a 89 y.o. year old male who sees [name], MD for primary care. I spoke with  Jack [name] by phone today.  What matters to the patients health and wellness today?  Patient has chronic back pain rate 10/10 most mornings, but better throughout the day.  Pain has decreased now, and he is able to walk with minimal pain.     Goals Addressed             This Visit's Progress    Effective management of back pain       Care Coordination Interventions: Reviewed provider established plan for pain management Discussed importance of adherence to all scheduled medical appointments Counseled on the importance of reporting any/all new or changed pain symptoms or management strategies to pain management provider Discussed use of relaxation techniques and/or diversional activities to assist with pain reduction (distraction, imagery, relaxation, massage, acupressure, TENS, heat, and cold application Reviewed with patient prescribed pharmacological and nonpharmacological pain relief strategies Screening for signs and symptoms of depression related to chronic disease state  Assessed social determinant of health barriers Follow up appointments: cancer center on 4/9 and 4/30, cardiology on 6/19          SDOH assessments and interventions completed:  Yes  SDOH Interventions Today    Flowsheet Row Most Recent Value  SDOH Interventions   Food Insecurity Interventions Intervention Not Indicated  Housing Interventions Intervention Not Indicated  Transportation Interventions Intervention Not Indicated        Care Coordination Interventions:  Yes, provided   Interventions Today    Flowsheet Row Most Recent Value  Chronic Disease   Chronic disease during today's visit Other  [back pain]  General Interventions   General Interventions Discussed/Reviewed  General Interventions Reviewed, Doctor Visits  Doctor Visits Discussed/Reviewed Doctor Visits Discussed, Annual Wellness Visits  [Advised to schedule AWV with PCP team]  Education Interventions   Education Provided Provided Education  Provided Verbal Education On When to see the doctor, Medication  Pharmacy Interventions   Pharmacy Dicussed/Reviewed Pharmacy Topics Discussed, Affording Medications        Follow up plan: Follow up call scheduled for 6/10    Encounter Outcome:  Pt. Visit Completed   Jack David, RN, MSN, [place] [facility] Care Management Coordinator [phone]

## 2022-12-15 NOTE — Patient Outreach (Signed)
  Care Coordination   12/15/2022 Name: HOA FRAIM MRN: [id number] DOB: 1933/01/27   Care Coordination Outreach Attempts:  An unsuccessful telephone outreach was attempted for a scheduled appointment today.  Follow Up Plan:  Additional outreach attempts will be made to offer the patient care coordination information and services.   Encounter Outcome:  No Answer   Care Coordination Interventions:  No, not indicated    Valente David, RN, MSN, [facility] Care Management Care Management Coordinator [phone]

## 2022-12-17 ENCOUNTER — Other Ambulatory Visit: Payer: Self-pay | Admitting: *Deleted

## 2022-12-17 ENCOUNTER — Encounter: Payer: Self-pay | Admitting: Oncology

## 2022-12-17 DIAGNOSIS — C9000 Multiple myeloma not having achieved remission: Secondary | ICD-10-CM

## 2022-12-22 ENCOUNTER — Other Ambulatory Visit: Payer: PPO

## 2022-12-22 ENCOUNTER — Encounter: Payer: Self-pay | Admitting: Oncology

## 2022-12-23 ENCOUNTER — Other Ambulatory Visit: Payer: Self-pay | Admitting: Hospice and Palliative Medicine

## 2022-12-23 MED ORDER — MORPHINE SULFATE ER 30 MG PO TBCR: 30.0000 mg | EXTENDED_RELEASE_TABLET | Freq: Three times a day (TID) | ORAL | 0 refills | Status: DC

## 2022-12-23 NOTE — Progress Notes (Signed)
MS Contin refilled. PDMP reviewed.

## 2022-12-28 ENCOUNTER — Encounter: Payer: Self-pay | Admitting: Oncology

## 2022-12-28 ENCOUNTER — Encounter
Admission: RE | Admit: 2022-12-28 | Discharge: 2022-12-28 | Disposition: A | Discharge status: Home / Self Care | Payer: PPO | Source: Ambulatory Visit | Attending: Oncology | Admitting: Oncology

## 2022-12-28 ENCOUNTER — Other Ambulatory Visit: Payer: Self-pay | Admitting: *Deleted

## 2022-12-28 DIAGNOSIS — C9000 Multiple myeloma not having achieved remission: Secondary | ICD-10-CM | POA: Insufficient documentation

## 2022-12-28 MED ORDER — TECHNETIUM TC 99M MEDRONATE IV KIT
23.3500 | PACK | Freq: Once | INTRAVENOUS | Status: AC | PRN
  Administered 2022-12-28: 23.35 via INTRAVENOUS

## 2022-12-28 MED ORDER — OXYCODONE HCL 10 MG PO TABS: 10.0000 mg | ORAL_TABLET | ORAL | 0 refills | Status: DC | PRN

## 2022-12-30 ENCOUNTER — Encounter: Payer: Self-pay | Admitting: Oncology

## 2023-01-01 DIAGNOSIS — M6281 Muscle weakness (generalized): Secondary | ICD-10-CM | POA: Diagnosis not present

## 2023-01-01 DIAGNOSIS — C9000 Multiple myeloma not having achieved remission: Secondary | ICD-10-CM | POA: Diagnosis not present

## 2023-01-01 DIAGNOSIS — S32592D Other specified fracture of left pubis, subsequent encounter for fracture with routine healing: Secondary | ICD-10-CM | POA: Diagnosis not present

## 2023-01-01 DIAGNOSIS — J9601 Acute respiratory failure with hypoxia: Secondary | ICD-10-CM | POA: Diagnosis not present

## 2023-01-01 DIAGNOSIS — R2689 Other abnormalities of gait and mobility: Secondary | ICD-10-CM | POA: Diagnosis not present

## 2023-01-03 ENCOUNTER — Other Ambulatory Visit: Payer: Self-pay

## 2023-01-03 DIAGNOSIS — I10 Essential (primary) hypertension: Secondary | ICD-10-CM | POA: Diagnosis not present

## 2023-01-03 DIAGNOSIS — C9000 Multiple myeloma not having achieved remission: Secondary | ICD-10-CM | POA: Diagnosis not present

## 2023-01-03 DIAGNOSIS — N184 Chronic kidney disease, stage 4 (severe): Secondary | ICD-10-CM | POA: Diagnosis not present

## 2023-01-03 DIAGNOSIS — Z1331 Encounter for screening for depression: Secondary | ICD-10-CM | POA: Diagnosis not present

## 2023-01-03 DIAGNOSIS — I4891 Unspecified atrial fibrillation: Secondary | ICD-10-CM | POA: Diagnosis not present

## 2023-01-03 DIAGNOSIS — N400 Enlarged prostate without lower urinary tract symptoms: Secondary | ICD-10-CM | POA: Diagnosis not present

## 2023-01-03 DIAGNOSIS — Z0000 Encounter for general adult medical examination without abnormal findings: Secondary | ICD-10-CM | POA: Diagnosis not present

## 2023-01-03 MED ORDER — LENALIDOMIDE 20 MG PO CAPS: 20.0000 mg | ORAL_CAPSULE | Freq: Every day | ORAL | 0 refills | Status: DC

## 2023-01-04 DIAGNOSIS — H353231 Exudative age-related macular degeneration, bilateral, with active choroidal neovascularization: Secondary | ICD-10-CM | POA: Diagnosis not present

## 2023-01-05 DIAGNOSIS — C9000 Multiple myeloma not having achieved remission: Secondary | ICD-10-CM | POA: Diagnosis not present

## 2023-01-05 DIAGNOSIS — S32592D Other specified fracture of left pubis, subsequent encounter for fracture with routine healing: Secondary | ICD-10-CM | POA: Diagnosis not present

## 2023-01-05 DIAGNOSIS — J9601 Acute respiratory failure with hypoxia: Secondary | ICD-10-CM | POA: Diagnosis not present

## 2023-01-05 DIAGNOSIS — R2689 Other abnormalities of gait and mobility: Secondary | ICD-10-CM | POA: Diagnosis not present

## 2023-01-05 DIAGNOSIS — M6281 Muscle weakness (generalized): Secondary | ICD-10-CM | POA: Diagnosis not present

## 2023-01-06 ENCOUNTER — Other Ambulatory Visit: Payer: Self-pay | Admitting: Oncology

## 2023-01-06 DIAGNOSIS — C9000 Multiple myeloma not having achieved remission: Secondary | ICD-10-CM

## 2023-01-11 ENCOUNTER — Inpatient Hospital Stay: Payer: PPO

## 2023-01-11 ENCOUNTER — Encounter: Payer: Self-pay | Admitting: Oncology

## 2023-01-11 ENCOUNTER — Inpatient Hospital Stay: Payer: PPO | Admitting: Pharmacist

## 2023-01-11 ENCOUNTER — Inpatient Hospital Stay (HOSPITAL_BASED_OUTPATIENT_CLINIC_OR_DEPARTMENT_OTHER): Payer: PPO | Admitting: Oncology

## 2023-01-11 ENCOUNTER — Inpatient Hospital Stay (HOSPITAL_BASED_OUTPATIENT_CLINIC_OR_DEPARTMENT_OTHER): Payer: PPO | Admitting: Hospice and Palliative Medicine

## 2023-01-11 ENCOUNTER — Inpatient Hospital Stay (HOSPITAL_BASED_OUTPATIENT_CLINIC_OR_DEPARTMENT_OTHER): Payer: PPO

## 2023-01-11 VITALS — BP 144/75 | HR 61 | Temp 98.2°F | Resp 16 | Ht 68.0 in | Wt 168.1 lb

## 2023-01-11 DIAGNOSIS — C9000 Multiple myeloma not having achieved remission: Secondary | ICD-10-CM | POA: Diagnosis not present

## 2023-01-11 LAB — COMPREHENSIVE METABOLIC PANEL
ALT: 13 U/L (ref 0–44)
AST: 30 U/L (ref 15–41)
Albumin: 3.7 g/dL (ref 3.5–5.0)
Alkaline Phosphatase: 67 U/L (ref 38–126)
Anion gap: 9 (ref 5–15)
BUN: 31 mg/dL — ABNORMAL HIGH (ref 8–23)
CO2: 23 mmol/L (ref 22–32)
Calcium: 8.9 mg/dL (ref 8.9–10.3)
Chloride: 102 mmol/L (ref 98–111)
Creatinine, Ser: 1.36 mg/dL — ABNORMAL HIGH (ref 0.61–1.24)
GFR, Estimated: 50 mL/min — ABNORMAL LOW (ref >60)
Glucose, Bld: 193 mg/dL — ABNORMAL HIGH (ref 70–99)
Potassium: 4.5 mmol/L (ref 3.5–5.1)
Sodium: 134 mmol/L — ABNORMAL LOW (ref 135–145)
Total Bilirubin: 0.4 mg/dL (ref 0.3–1.2)
Total Protein: 6.3 g/dL — ABNORMAL LOW (ref 6.5–8.1)

## 2023-01-11 LAB — CBC WITH DIFFERENTIAL/PLATELET
Abs Immature Granulocytes: 0.01 10*3/uL (ref 0.00–0.07)
Basophils Absolute: 0 10*3/uL (ref 0.0–0.1)
Basophils Relative: 1 %
Eosinophils Absolute: 0 10*3/uL (ref 0.0–0.5)
Eosinophils Relative: 0 %
HCT: 38.8 % — ABNORMAL LOW (ref 39.0–52.0)
Hemoglobin: 12.5 g/dL — ABNORMAL LOW (ref 13.0–17.0)
Immature Granulocytes: 0 %
Lymphocytes Relative: 27 %
Lymphs Abs: 0.7 10*3/uL (ref 0.7–4.0)
MCH: 32.6 pg (ref 26.0–34.0)
MCHC: 32.2 g/dL (ref 30.0–36.0)
MCV: 101 fL — ABNORMAL HIGH (ref 80.0–100.0)
Monocytes Absolute: 0.1 10*3/uL (ref 0.1–1.0)
Monocytes Relative: 3 %
Neutro Abs: 1.9 10*3/uL (ref 1.7–7.7)
Neutrophils Relative %: 69 %
Platelets: 75 10*3/uL — ABNORMAL LOW (ref 150–400)
RBC: 3.84 MIL/uL — ABNORMAL LOW (ref 4.22–5.81)
RDW: 16.4 % — ABNORMAL HIGH (ref 11.5–15.5)
WBC: 2.7 10*3/uL — ABNORMAL LOW (ref 4.0–10.5)
nRBC: 0 % (ref 0.0–0.2)

## 2023-01-11 MED ORDER — GABAPENTIN 300 MG PO CAPS: 600.0000 mg | ORAL_CAPSULE | Freq: Three times a day (TID) | ORAL | 2 refills | Status: DC

## 2023-01-11 MED ORDER — DENOSUMAB 120 MG/1.7ML ~~LOC~~ SOLN
120.0000 mg | Freq: Once | SUBCUTANEOUS | Status: AC
  Administered 2023-01-11: 120 mg via SUBCUTANEOUS
  Filled 2023-01-11: qty 1.7

## 2023-01-11 NOTE — Progress Notes (Signed)
Palliative Medicine [facility] at [facility] [phone] [phone]   Name: Jack [name] Date: 01/11/2023 MRN: [id number]  DOB: 15-Oct-1932  Patient Care Team: Dorothey Baseman, MD as PCP - General (Family Medicine) End, Cristal Deer, MD as PCP - Cardiology (Cardiology) Quentin Cornwall, MD (Endocrinology) Jeralyn Ruths, MD as Consulting Physician (Hematology and Oncology) Kemper Durie, RN as [facility] Care Management    REASON FOR CONSULTATION: Jack [name] is a 89 y.o. male with multiple medical problems including hypertension, CKD stage IIIb, A. fib on Eliquis, and anemia.  Patient was hospitalized 01/02/2021 to 01/07/2021 with T12 compression fracture with recommendation for conservative management including use of a TLSO brace.  Patient was hospitalized again 01/29/2021 to 02/07/2021 with recurrent back pain and MR showing T12 and new L1 compression fracture.  Patient underwent T12/L1 kyphoplasty on 02/02/2021.  There was also suspicion of a left fifth rib fracture, which occurred while transitioning from the stretcher for his kyphoplasty.  Patient had mild hypercalcemia with SPEP and UPEP concerning for possible myeloma.  Patient was referred to [facility] for work-up.  He was readmitted 03/11/2021 - 03/17/2021 with worsening back pain.  CT revealed new compression fractures of L2 and L3.  Patient underwent kyphoplasty on 6/30.  He was hospitalized again 03/26/2021-04/07/2021 with severe back/flank pain.  MRI revealed T10 pathologic compression fracture.  He was started on treatment for multiple myeloma with dexamethasone/Velcade and XRT to spine.   SOCIAL HISTORY:     reports that he quit smoking about 57 years ago. His smoking use included cigarettes. He has never used smokeless tobacco. He reports that he does not currently use alcohol after a past usage of about 1.0 standard drink of alcohol per week. He reports  that he does not use drugs.  Patient lives at [address] in [place] with his wife of 19 years.  Patient has a son and daughter who are also involved in his care.  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  ADVANCE DIRECTIVES:  On file  CODE STATUS:   PAST MEDICAL HISTORY: Past Medical History:  Diagnosis Date   Ascending aortic aneurysm (HCC)    a. 12/2020 Echo: Asc Ao 48mm, Ao root 40mm. b. 03/2022 Asc Ao 4.6 cm (4.5 cm in 2019)   CKD (chronic kidney disease), stage III (HCC)    Diastolic dysfunction    a. 12/2020 Echo: EF 50-55%, no rwma, mild LVH, GrI DD, nl RV fxn, mild AI. Asc Ao 48mm, Ao root 40mm.   Elevated prostate specific antigen (PSA)    has been 7 for a year    GERD (gastroesophageal reflux disease)    History of colon polyps 2008   [facility],    History of kidney stones    Hyperlipidemia    Hypertension    Myeloma (HCC)    PAF (paroxysmal atrial fibrillation) (HCC)    a. 01/2021-->Eliquis (CHA2DS2VASc = 3-4).   Pain    Prostate hypertrophy    diagnosed at age 59 due to hematospermia    PAST SURGICAL HISTORY:  Past Surgical History:  Procedure Laterality Date   CATARACT EXTRACTION W/PHACO Left 01/10/2018   Procedure: CATARACT EXTRACTION PHACO AND INTRAOCULAR LENS PLACEMENT (IOC);  Surgeon: Galen Manila, MD;  Location: [facility] ORS;  Service: Ophthalmology;  Laterality: Left;  [name] 00:24.8 AP% 14.9 CDE 3.68 Fluid pack lot # [id number]   CATARACT EXTRACTION W/PHACO Right 01/25/2018  Procedure: CATARACT EXTRACTION PHACO AND INTRAOCULAR LENS PLACEMENT (IOC);  Surgeon: Galen Manila, MD;  Location: [facility] ORS;  Service: Ophthalmology;  Laterality: Right;  [name] 00:42 AP% 10.8 CDE 4.59 Fluid pack lot # [id number]   COLON SURGERY     CYSTOSCOPY W/ URETERAL STENT PLACEMENT Right 10/16/2017   Procedure: right  URETERAL STENT PLACEMENT,cystoscopy bilateral stent removal,rretrograde;  Surgeon: Riki Altes, MD;  Location: [facility] ORS;   Service: Urology;  Laterality: Right;   CYSTOSCOPY/URETEROSCOPY/HOLMIUM LASER/STENT PLACEMENT Right 12/16/2020   Procedure: CYSTOSCOPY/URETEROSCOPY/HOLMIUM LASER/STENT PLACEMENT;  Surgeon: Riki Altes, MD;  Location: [facility] ORS;  Service: Urology;  Laterality: Right;   EXTRACORPOREAL SHOCK WAVE LITHOTRIPSY Right 12/11/2020   Procedure: EXTRACORPOREAL SHOCK WAVE LITHOTRIPSY (ESWL);  Surgeon: Riki Altes, MD;  Location: [facility] ORS;  Service: Urology;  Laterality: Right;   IR BONE TUMOR(S)RF ABLATION  04/16/2022   IR KYPHO EA ADDL LEVEL THORACIC OR LUMBAR  02/02/2021   IR KYPHO LUMBAR INC FX REDUCE BONE BX UNI/BIL CANNULATION INC/IMAGING  02/02/2021   IR KYPHO THORACIC WITH BONE BIOPSY  04/15/2022   KIDNEY STONE SURGERY     KYPHOPLASTY N/A 03/12/2021   Procedure: Nicki Reaper, L3;  Surgeon: Kennedy Bucker, MD;  Location: [facility] ORS;  Service: Orthopedics;  Laterality: N/A;   RESECTION SOFT TISSUE TUMOR LEG / ANKLE RADICAL  jan 2009   [facility],  right thigh/knee , nonmalignant   SMALL INTESTINE SURGERY  1946   implaed on picket fence, punctured stomach   TONSILLECTOMY      HEMATOLOGY/ONCOLOGY HISTORY:  Oncology History  Kappa light chain myeloma (HCC)  03/12/2021 Initial Diagnosis   Kappa light chain myeloma (HCC)   03/27/2021 - 04/06/2021 Chemotherapy   Patient is on Treatment Plan : MYELOMA NON-TRANSPLANT CANDIDATES VRd SQ q21d      04/16/2021 Cancer Staging   Staging form: Plasma Cell Myeloma and Plasma Cell Disorders, AJCC 8th Edition - Clinical stage from 04/16/2021: Albumin (g/dL): 3.8, ISS: Stage II, High-risk cytogenetics: Absent, LDH: Unknown - Signed by Jeralyn Ruths, MD on 04/16/2021 Albumin range (g/dL): Greater than or equal to 3.5 Cytogenetics: t(11;14) translocation Serum calcium level: Normal Serum creatinine level: Normal Bone disease on imaging: Present     ALLERGIES:  is allergic to quinolones, amlodipine, azithromycin, lipitor [atorvastatin], lisinopril, and zetia  [ezetimibe].  MEDICATIONS:  Current Outpatient Medications  Medication Sig Dispense Refill   acetaminophen (TYLENOL) 500 MG tablet Take 500-1,000 mg by mouth every 8 (eight) hours as needed for moderate pain.     albuterol (VENTOLIN HFA) 108 (90 Base) MCG/ACT inhaler Inhale into the lungs.     Ascorbic Acid (VITAMIN C) 1000 MG tablet Take 1,000 mg by mouth in the morning and at bedtime.     Calcium Carb-Cholecalciferol (OYSTER SHELL CALCIUM W/D) 500-5 MG-MCG TABS Take 1 tablet by mouth in the morning and at bedtime.     carvedilol (COREG) 12.5 MG tablet Take 1 tablet (12.5 mg total) by mouth 2 (two) times daily. 180 tablet 3   dexamethasone (DECADRON) 4 MG tablet TAKE 5 TABLETS BY MOUTH ONE TIME PER WEEK 20 tablet 4   ELIQUIS 5 MG TABS tablet TAKE 1 TABLET BY MOUTH TWICE A DAY 180 tablet 0   feeding supplement (ENSURE ENLIVE / ENSURE PLUS) LIQD Take 237 mLs by mouth 3 (three) times daily between meals. (Patient taking differently: Take 237 mLs by mouth daily with lunch.) 237 mL 12   gabapentin (NEURONTIN) 300 MG capsule Take 2 capsules (600 mg total) by mouth  3 (three) times daily. 180 capsule 2   ipratropium-albuterol (DUONEB) 0.5-2.5 (3) MG/3ML SOLN Take 3 mLs by nebulization every 4 (four) hours as needed. 360 mL 0   lenalidomide (REVLIMID) 20 MG capsule Take 1 capsule (20 mg total) by mouth daily. Take for 21 days, then hold for 7 days. Repeat every 28 days 21 capsule 0   lidocaine (XYLOCAINE) 5 % ointment Apply topically 3 (three) times daily as needed for mild pain or moderate pain. 50 g 2   morphine (MS CONTIN) 30 MG 12 hr tablet Take 1 tablet (30 mg total) by mouth every 8 (eight) hours. 90 tablet 0   Multiple Vitamin (MULTIVITAMIN WITH MINERALS) TABS tablet Take 1 tablet by mouth daily.     Multiple Vitamins-Minerals (PRESERVISION AREDS PO) Take 1 capsule by mouth in the morning and at bedtime.     mupirocin ointment (BACTROBAN) 2 % Apply 1 Application topically 2 (two) times daily. 22 g  0   naloxone (NARCAN) nasal spray 4 mg/0.1 mL SPRAY 1 SPRAY INTO ONE NOSTRIL AS DIRECTED FOR OPIOID OVERDOSE (TURN PERSON ON SIDE AFTER DOSE. IF NO RESPONSE IN 2-3 MINUTES OR PERSON RESPONDS BUT RELAPSES, REPEAT USING A NEW SPRAY DEVICE AND SPRAY INTO THE OTHER NOSTRIL. CALL 911 AFTER USE.) * EMERGENCY USE ONLY * 1 each 0   omeprazole (PRILOSEC) 20 MG capsule Take 1 capsule (20 mg total) by mouth daily. 30 capsule 11   Oxycodone HCl 10 MG TABS Take 1 tablet (10 mg total) by mouth every 4 (four) hours as needed (pain). 90 tablet 0   polyethylene glycol (MIRALAX / GLYCOLAX) 17 g packet Take 17 g by mouth 2 (two) times daily.  0   potassium citrate (UROCIT-K) 10 MEQ (1080 MG) SR tablet TAKE 1 TABLET (10 MEQ TOTAL) BY MOUTH 3 (THREE) TIMES DAILY WITH MEALS. 270 tablet 2   senna-docusate (SENOKOT-S) 8.6-50 MG tablet Take 1 tablet by mouth 2 (two) times daily. 30 tablet 0   No current facility-administered medications for this visit.    VITAL SIGNS: There were no vitals taken for this visit. There were no vitals filed for this visit.   Estimated body mass index is 25.56 kg/m as calculated from the following:   Height as of an earlier encounter on 01/11/23: 5\' 8"  (1.727 m).   Weight as of an earlier encounter on 01/11/23: 168 lb 1.6 oz (76.2 kg).  LABS: CBC:    Component Value Date/Time   WBC 2.7 (L) 01/11/2023 1336   HGB 12.5 (L) 01/11/2023 1336   HGB 11.0 (L) 02/24/2021 1014   HCT 38.8 (L) 01/11/2023 1336   HCT 35.3 (L) 02/24/2021 1014   PLT 75 (L) 01/11/2023 1336   PLT 249 02/24/2021 1014   MCV 101.0 (H) 01/11/2023 1336   MCV 96 02/24/2021 1014   MCV 90 01/24/2013 0819   NEUTROABS 1.9 01/11/2023 1336   NEUTROABS 7.4 (H) 01/24/2013 0819   LYMPHSABS 0.7 01/11/2023 1336   LYMPHSABS 1.2 01/24/2013 0819   MONOABS 0.1 01/11/2023 1336   MONOABS 0.3 01/24/2013 0819   EOSABS 0.0 01/11/2023 1336   EOSABS 0.0 01/24/2013 0819   BASOSABS 0.0 01/11/2023 1336   BASOSABS 0.0 01/24/2013 0819    Comprehensive Metabolic Panel:    Component Value Date/Time   NA 134 (L) 01/11/2023 1336   NA 136 02/24/2021 1014   NA 138 01/24/2013 0819   K 4.5 01/11/2023 1336   K 3.8 01/24/2013 0819   CL 102 01/11/2023 1336  CL 105 01/24/2013 0819   CO2 23 01/11/2023 1336   CO2 25 01/24/2013 0819   BUN 31 (H) 01/11/2023 1336   BUN 23 02/24/2021 1014   BUN 20 (H) 01/24/2013 0819   CREATININE 1.36 (H) 01/11/2023 1336   CREATININE 1.52 (H) 01/24/2013 0819   GLUCOSE 193 (H) 01/11/2023 1336   GLUCOSE 171 (H) 01/24/2013 0819   CALCIUM 8.9 01/11/2023 1336   CALCIUM 10.4 (H) 02/19/2021 1229   AST 30 01/11/2023 1336   AST 18 01/24/2013 0819   ALT 13 01/11/2023 1336   ALT 15 01/24/2013 0819   ALKPHOS 67 01/11/2023 1336   ALKPHOS 74 01/24/2013 0819   BILITOT 0.4 01/11/2023 1336   BILITOT 0.4 02/24/2021 1014   BILITOT 0.5 01/24/2013 0819   PROT 6.3 (L) 01/11/2023 1336   PROT 6.2 02/24/2021 1014   PROT 6.3 (L) 01/24/2013 0819   ALBUMIN 3.7 01/11/2023 1336   ALBUMIN 4.5 02/24/2021 1014   ALBUMIN 3.6 01/24/2013 0819    RADIOGRAPHIC STUDIES: NM Bone Scan Whole Body  Result Date: 12/30/2022 CLINICAL DATA:  Multiple myeloma. EXAM: NUCLEAR MEDICINE WHOLE BODY BONE SCAN TECHNIQUE: Whole body anterior and posterior images were obtained approximately 3 hours after intravenous injection of radiopharmaceutical. RADIOPHARMACEUTICALS:  23.4 mCi Technetium-4m MDP IV COMPARISON:  Pelvic CT 06/10/2022 FINDINGS: Uptake in the LEFT superior pubic ramus corresponds to fracture identified on comparison CT. Mild symmetric uptake posteriorly at the T11 vertebral body is favored degenerative in nature. Mild uptake in the LEFT and RIGHT sacral ala is also favored degenerative. No focal uptake identified to suggest malignancy. IMPRESSION: No evident malignant lesion by MDP whole-body bone imaging. Electronically Signed   By: Genevive Bi M.D.   On: 12/30/2022 14:03    PERFORMANCE STATUS (ECOG) : 1 - Symptomatic  but completely ambulatory  Review of Systems Unless otherwise noted, a complete review of systems is negative.  Physical Exam General: NAD Pulmonary: Unlabored Extremities: no edema, no joint deformities Skin: no rashes Neurological: Weakness but otherwise nonfocal  IMPRESSION: Routine follow-up.  Patient's back pain has been less well-controlled over the past couple of months.  Previously, he was able to dose reduce his MS Contin and oxycodone but has had to take more of that recently.  He does feel like the pain medications are helping.  Today, he says pain is actually relatively good.  Patient is walking daily and sometimes he "overdoes it" leading to worse back pain for a few days.  Patient saw his PCP recently with discussion of his pain regimen.  Is recommended the patient increase gabapentin from twice daily to 3 times daily.  Rotating from MS Contin to transdermal fentanyl was also discussed.  Today, discussed pain regimen in detail with patient and wife.  Would recommend continuing MS Contin and oxycodone for now as that regimen appears to be helping pain.  Agree with increasing gabapentin back to 3 times daily.  We also discussed nonpharmacological management of pain such as use of heat and lidocaine patches.  Will refer back to PT as this seems to have helped previously.  Will also obtain repeat MRI of thoracic spine.  PLAN: -Continue current scope of treatment -Continue MS Contin/oxycodone -Increase gabapentin to 3 times daily -Repeat MRI thoracic spine -Daily bowel regimen -Follow-up telephone visit in 2 to 3 months  Case and plan discussed with Dr. Orlie Dakin  Patient expressed understanding and was in agreement with this plan. He also understands that He can call the clinic at any time  with any questions, concerns, or complaints.     Time Total: 15 minutes  Visit consisted of counseling and education dealing with the complex and emotionally intense issues of  symptom management and palliative care in the setting of serious and potentially life-threatening illness.Greater than 50%  of this time was spent counseling and coordinating care related to the above assessment and plan.  Signed by: Laurette Schimke, PhD, NP-C

## 2023-01-11 NOTE — Progress Notes (Signed)
Oral Chemotherapy Clinic [facility]  Telephone:([phone] [phone]  Patient Care Team: Dorothey Baseman, MD as PCP - General (Family Medicine) End, Cristal Deer, MD as PCP - Cardiology (Cardiology) Quentin Cornwall, MD (Endocrinology) Jeralyn Ruths, MD as Consulting Physician (Hematology and Oncology) Kemper Durie, RN as [facility] Care Management   Name of the patient: Jack [name]  [id number]  11-Oct-1932   Date of visit: 01/11/23  HPI: Patient is a 89 y.o. male with newly diagnosed multiple myeloma. Currently treated with Revlimid (lenalidomide) and dexamethasone. Patient was initiated on an all oral regimen because he was unable to physically make it into clinic at the time treatment due to his disease/performance status. His status improved he was able to come back to inperson appts on 07/14/21.  Reason for Consult: Oral chemotherapy follow-up for lenalidomide therapy.   PAST MEDICAL HISTORY: Past Medical History:  Diagnosis Date   Ascending aortic aneurysm (HCC)    a. 12/2020 Echo: Asc Ao 48mm, Ao root 40mm. b. 03/2022 Asc Ao 4.6 cm (4.5 cm in 2019)   CKD (chronic kidney disease), stage III (HCC)    Diastolic dysfunction    a. 12/2020 Echo: EF 50-55%, no rwma, mild LVH, GrI DD, nl RV fxn, mild AI. Asc Ao 48mm, Ao root 40mm.   Elevated prostate specific antigen (PSA)    has been 7 for a year    GERD (gastroesophageal reflux disease)    History of colon polyps 2008   [facility],    History of kidney stones    Hyperlipidemia    Hypertension    Myeloma (HCC)    PAF (paroxysmal atrial fibrillation) (HCC)    a. 01/2021-->Eliquis (CHA2DS2VASc = 3-4).   Pain    Prostate hypertrophy    diagnosed at age 4 due to hematospermia    HEMATOLOGY/ONCOLOGY HISTORY:  Oncology History  Kappa light chain myeloma (HCC)  03/12/2021 Initial Diagnosis   Kappa light chain myeloma (HCC)   03/27/2021 - 04/06/2021 Chemotherapy   Patient  is on Treatment Plan : MYELOMA NON-TRANSPLANT CANDIDATES VRd SQ q21d      04/16/2021 Cancer Staging   Staging form: Plasma Cell Myeloma and Plasma Cell Disorders, AJCC 8th Edition - Clinical stage from 04/16/2021: Albumin (g/dL): 3.8, ISS: Stage II, High-risk cytogenetics: Absent, LDH: Unknown - Signed by Jeralyn Ruths, MD on 04/16/2021 Albumin range (g/dL): Greater than or equal to 3.5 Cytogenetics: t(11;14) translocation Serum calcium level: Normal Serum creatinine level: Normal Bone disease on imaging: Present     ALLERGIES:  is allergic to quinolones, amlodipine, azithromycin, lipitor [atorvastatin], lisinopril, and zetia [ezetimibe].  MEDICATIONS:  Current Outpatient Medications  Medication Sig Dispense Refill   acetaminophen (TYLENOL) 500 MG tablet Take 500-1,000 mg by mouth every 8 (eight) hours as needed for moderate pain.     albuterol (VENTOLIN HFA) 108 (90 Base) MCG/ACT inhaler Inhale into the lungs.     Ascorbic Acid (VITAMIN C) 1000 MG tablet Take 1,000 mg by mouth in the morning and at bedtime.     Calcium Carb-Cholecalciferol (OYSTER SHELL CALCIUM W/D) 500-5 MG-MCG TABS Take 1 tablet by mouth in the morning and at bedtime.     carvedilol (COREG) 12.5 MG tablet Take 1 tablet (12.5 mg total) by mouth 2 (two) times daily. 180 tablet 3   dexamethasone (DECADRON) 4 MG tablet TAKE 5 TABLETS BY MOUTH ONE TIME PER WEEK 20 tablet 4   ELIQUIS 5 MG TABS tablet TAKE 1 TABLET BY MOUTH TWICE A  DAY 180 tablet 0   feeding supplement (ENSURE ENLIVE / ENSURE PLUS) LIQD Take 237 mLs by mouth 3 (three) times daily between meals. (Patient taking differently: Take 237 mLs by mouth daily with lunch.) 237 mL 12   gabapentin (NEURONTIN) 300 MG capsule Take 2 capsules (600 mg total) by mouth 3 (three) times daily. 180 capsule 2   ipratropium-albuterol (DUONEB) 0.5-2.5 (3) MG/3ML SOLN Take 3 mLs by nebulization every 4 (four) hours as needed. 360 mL 0   lenalidomide (REVLIMID) 20 MG capsule Take 1  capsule (20 mg total) by mouth daily. Take for 21 days, then hold for 7 days. Repeat every 28 days 21 capsule 0   lidocaine (XYLOCAINE) 5 % ointment Apply topically 3 (three) times daily as needed for mild pain or moderate pain. 50 g 2   morphine (MS CONTIN) 30 MG 12 hr tablet Take 1 tablet (30 mg total) by mouth every 8 (eight) hours. 90 tablet 0   Multiple Vitamin (MULTIVITAMIN WITH MINERALS) TABS tablet Take 1 tablet by mouth daily.     Multiple Vitamins-Minerals (PRESERVISION AREDS PO) Take 1 capsule by mouth in the morning and at bedtime.     mupirocin ointment (BACTROBAN) 2 % Apply 1 Application topically 2 (two) times daily. 22 g 0   naloxone (NARCAN) nasal spray 4 mg/0.1 mL SPRAY 1 SPRAY INTO ONE NOSTRIL AS DIRECTED FOR OPIOID OVERDOSE (TURN PERSON ON SIDE AFTER DOSE. IF NO RESPONSE IN 2-3 MINUTES OR PERSON RESPONDS BUT RELAPSES, REPEAT USING A NEW SPRAY DEVICE AND SPRAY INTO THE OTHER NOSTRIL. CALL 911 AFTER USE.) * EMERGENCY USE ONLY * 1 each 0   omeprazole (PRILOSEC) 20 MG capsule Take 1 capsule (20 mg total) by mouth daily. 30 capsule 11   Oxycodone HCl 10 MG TABS Take 1 tablet (10 mg total) by mouth every 4 (four) hours as needed (pain). 90 tablet 0   polyethylene glycol (MIRALAX / GLYCOLAX) 17 g packet Take 17 g by mouth 2 (two) times daily.  0   potassium citrate (UROCIT-K) 10 MEQ (1080 MG) SR tablet TAKE 1 TABLET (10 MEQ TOTAL) BY MOUTH 3 (THREE) TIMES DAILY WITH MEALS. 270 tablet 2   senna-docusate (SENOKOT-S) 8.6-50 MG tablet Take 1 tablet by mouth 2 (two) times daily. 30 tablet 0   No current facility-administered medications for this visit.   Facility-Administered Medications Ordered in Other Visits  Medication Dose Route Frequency Provider Last Rate Last Admin   denosumab (XGEVA) injection 120 mg  120 mg Subcutaneous Once Jeralyn Ruths, MD        VITAL SIGNS: There were no vitals taken for this visit. There were no vitals filed for this visit.  Estimated body mass  index is 25.56 kg/m as calculated from the following:   Height as of an earlier encounter on 01/11/23: 5\' 8"  (1.727 m).   Weight as of an earlier encounter on 01/11/23: 76.2 kg (168 lb 1.6 oz).  LABS: CBC:    Component Value Date/Time   WBC 2.7 (L) 01/11/2023 1336   HGB 12.5 (L) 01/11/2023 1336   HGB 11.0 (L) 02/24/2021 1014   HCT 38.8 (L) 01/11/2023 1336   HCT 35.3 (L) 02/24/2021 1014   PLT 75 (L) 01/11/2023 1336   PLT 249 02/24/2021 1014   MCV 101.0 (H) 01/11/2023 1336   MCV 96 02/24/2021 1014   MCV 90 01/24/2013 0819   NEUTROABS 1.9 01/11/2023 1336   NEUTROABS 7.4 (H) 01/24/2013 0819   LYMPHSABS 0.7 01/11/2023 1336  LYMPHSABS 1.2 01/24/2013 0819   MONOABS 0.1 01/11/2023 1336   MONOABS 0.3 01/24/2013 0819   EOSABS 0.0 01/11/2023 1336   EOSABS 0.0 01/24/2013 0819   BASOSABS 0.0 01/11/2023 1336   BASOSABS 0.0 01/24/2013 0819   Comprehensive Metabolic Panel:    Component Value Date/Time   NA 134 (L) 01/11/2023 1336   NA 136 02/24/2021 1014   NA 138 01/24/2013 0819   K 4.5 01/11/2023 1336   K 3.8 01/24/2013 0819   CL 102 01/11/2023 1336   CL 105 01/24/2013 0819   CO2 23 01/11/2023 1336   CO2 25 01/24/2013 0819   BUN 31 (H) 01/11/2023 1336   BUN 23 02/24/2021 1014   BUN 20 (H) 01/24/2013 0819   CREATININE 1.36 (H) 01/11/2023 1336   CREATININE 1.52 (H) 01/24/2013 0819   GLUCOSE 193 (H) 01/11/2023 1336   GLUCOSE 171 (H) 01/24/2013 0819   CALCIUM 8.9 01/11/2023 1336   CALCIUM 10.4 (H) 02/19/2021 1229   AST 30 01/11/2023 1336   AST 18 01/24/2013 0819   ALT 13 01/11/2023 1336   ALT 15 01/24/2013 0819   ALKPHOS 67 01/11/2023 1336   ALKPHOS 74 01/24/2013 0819   BILITOT 0.4 01/11/2023 1336   BILITOT 0.4 02/24/2021 1014   BILITOT 0.5 01/24/2013 0819   PROT 6.3 (L) 01/11/2023 1336   PROT 6.2 02/24/2021 1014   PROT 6.3 (L) 01/24/2013 0819   ALBUMIN 3.7 01/11/2023 1336   ALBUMIN 4.5 02/24/2021 1014   ALBUMIN 3.6 01/24/2013 0819     Present during today's visit:  Patient and his wife Jack [name]  Assessment and Plan: CBC/CMP reviewed, okay to continue with lenalidomide 20mg , 21 days on/7off and dexamethasone 20mg  weekly Pain: patient to see Josh, NP today to discuss pain management   Oral Chemotherapy Adherence: no reported missed doses.  No patient barriers to medication adherence identified.   New medications: none reported  Medication Access Issues: no issues, fills Revlimid at [employer]  Patient expressed understanding and was in agreement with this plan. He also understands that He can call clinic at any time with any questions, concerns, or complaints.   Follow-up plan: RTC in 4 weeks  Thank you for allowing me to participate in the care of this very pleasant patient.   Time Total: 15 mins  Visit consisted of counseling and education on dealing with issues of symptom management in the setting of serious and potentially life-threatening illness.Greater than 50%  of this time was spent counseling and coordinating care related to the above assessment and plan.  Signed by: Remi Haggard, PharmD, BCPS, Nolon Bussing, CPP Hematology/Oncology Clinical Pharmacist Practitioner /DB/AP Oral Chemotherapy Navigation Clinic [phone]  01/11/2023 3:10 PM

## 2023-01-11 NOTE — Progress Notes (Unsigned)
Would like to talk about pain management. Dr. Terance Hart advised per wife that patient may be taking too much pain medication. Would like to talk about alternative pain meds as well. Having bad back pain.

## 2023-01-11 NOTE — Progress Notes (Unsigned)
[facility]  Telephone:([phone]) [phone] Fax:(336) [phone]  ID: [name] OB: 07-13-1933  MR#: [id number]  BMW#:[id number]  Patient Care Team: Dorothey Baseman, MD as PCP - General (Family Medicine) End, Cristal Deer, MD as PCP - Cardiology (Cardiology) Quentin Cornwall, MD (Endocrinology) Jeralyn Ruths, MD as Consulting Physician (Hematology and Oncology) Kemper Durie, RN as [facility] Care Management   CHIEF COMPLAINT: Stage II kappa chain myeloma.  INTERVAL HISTORY: Patient returns to clinic today for further evaluation and continuation of Revlimid and Xgeva.  He continues to have significant back pain, but otherwise feels well. He has no neurologic complaints.  He denies any fevers.  He has a good appetite and denies weight loss.  He denies chest pain, shortness of breath, or hemoptysis.  He denies any nausea, vomiting, constipation, or diarrhea.  He has no urinary complaints.  Patient offers no further specific complaints today.  REVIEW OF SYSTEMS:   Review of Systems  Constitutional: Negative.  Negative for fever and malaise/fatigue.  Respiratory: Negative.  Negative for cough, hemoptysis and shortness of breath.   Cardiovascular: Negative.  Negative for chest pain and leg swelling.  Gastrointestinal: Negative.  Negative for abdominal pain.  Genitourinary: Negative.  Negative for dysuria and flank pain.  Musculoskeletal:  Positive for back pain. Negative for falls and joint pain.  Skin: Negative.  Negative for rash.  Neurological: Negative.  Negative for dizziness, focal weakness, weakness and headaches.  Psychiatric/Behavioral: Negative.  The patient is not nervous/anxious.     As per HPI. Otherwise, a complete review of systems is negative.  PAST MEDICAL HISTORY: Past Medical History:  Diagnosis Date   Ascending aortic aneurysm (HCC)    a. 12/2020 Echo: Asc Ao 48mm, Ao root 40mm. b. 03/2022 Asc Ao 4.6 cm (4.5 cm in 2019)   CKD  (chronic kidney disease), stage III (HCC)    Diastolic dysfunction    a. 12/2020 Echo: EF 50-55%, no rwma, mild LVH, GrI DD, nl RV fxn, mild AI. Asc Ao 48mm, Ao root 40mm.   Elevated prostate specific antigen (PSA)    has been 7 for a year    GERD (gastroesophageal reflux disease)    History of colon polyps 2008   [facility],    History of kidney stones    Hyperlipidemia    Hypertension    Myeloma (HCC)    PAF (paroxysmal atrial fibrillation) (HCC)    a. 01/2021-->Eliquis (CHA2DS2VASc = 3-4).   Pain    Prostate hypertrophy    diagnosed at age 82 due to hematospermia    PAST SURGICAL HISTORY: Past Surgical History:  Procedure Laterality Date   CATARACT EXTRACTION W/PHACO Left 01/10/2018   Procedure: CATARACT EXTRACTION PHACO AND INTRAOCULAR LENS PLACEMENT (IOC);  Surgeon: Galen Manila, MD;  Location: [facility] ORS;  Service: Ophthalmology;  Laterality: Left;  [name] 00:24.8 AP% 14.9 CDE 3.68 Fluid pack lot # [id number]   CATARACT EXTRACTION W/PHACO Right 01/25/2018   Procedure: CATARACT EXTRACTION PHACO AND INTRAOCULAR LENS PLACEMENT (IOC);  Surgeon: Galen Manila, MD;  Location: [facility] ORS;  Service: Ophthalmology;  Laterality: Right;  [name] 00:42 AP% 10.8 CDE 4.59 Fluid pack lot # [id number]   COLON SURGERY     CYSTOSCOPY W/ URETERAL STENT PLACEMENT Right 10/16/2017   Procedure: right  URETERAL STENT PLACEMENT,cystoscopy bilateral stent removal,rretrograde;  Surgeon: Riki Altes, MD;  Location: [facility] ORS;  Service: Urology;  Laterality: Right;   CYSTOSCOPY/URETEROSCOPY/HOLMIUM LASER/STENT PLACEMENT Right 12/16/2020   Procedure: CYSTOSCOPY/URETEROSCOPY/HOLMIUM LASER/STENT PLACEMENT;  Surgeon:  Stoioff, Verna Czech, MD;  Location: [facility] ORS;  Service: Urology;  Laterality: Right;   EXTRACORPOREAL SHOCK WAVE LITHOTRIPSY Right 12/11/2020   Procedure: EXTRACORPOREAL SHOCK WAVE LITHOTRIPSY (ESWL);  Surgeon: Riki Altes, MD;  Location: [facility] ORS;  Service: Urology;  Laterality: Right;   IR BONE  TUMOR(S)RF ABLATION  04/16/2022   IR KYPHO EA ADDL LEVEL THORACIC OR LUMBAR  02/02/2021   IR KYPHO LUMBAR INC FX REDUCE BONE BX UNI/BIL CANNULATION INC/IMAGING  02/02/2021   IR KYPHO THORACIC WITH BONE BIOPSY  04/15/2022   KIDNEY STONE SURGERY     KYPHOPLASTY N/A 03/12/2021   Procedure: Nicki Reaper, L3;  Surgeon: Kennedy Bucker, MD;  Location: [facility] ORS;  Service: Orthopedics;  Laterality: N/A;   RESECTION SOFT TISSUE TUMOR LEG / ANKLE RADICAL  jan 2009   [facility],  right thigh/knee , nonmalignant   SMALL INTESTINE SURGERY  1946   implaed on picket fence, punctured stomach   TONSILLECTOMY      FAMILY HISTORY: Family History  Problem Relation Age of Onset   Hypertension Father    Hyperlipidemia Father    Cancer Sister        thyroid - dx in late 20's    ADVANCED DIRECTIVES (Y/N):  N  HEALTH MAINTENANCE: Social History   Tobacco Use   Smoking status: Former    Types: Cigarettes    Quit date: 07/05/1965    Years since quitting: 57.5   Smokeless tobacco: Never  Vaping Use   Vaping Use: Never used  Substance Use Topics   Alcohol use: Not Currently    Alcohol/week: 1.0 standard drink of alcohol    Types: 1 Standard drinks or equivalent per week    Comment: occassionaly   Drug use: No     Colonoscopy:  PAP:  Bone density:  Lipid panel:  Allergies  Allergen Reactions   Quinolones     Aortic dilation contraindicates FQ   Amlodipine Swelling and Other (See Comments)    LE edema   Azithromycin Nausea And Vomiting   Lipitor [Atorvastatin]     Muscle pain in RT Leg    Lisinopril Cough   Zetia [Ezetimibe]     Pain in legs     Current Outpatient Medications  Medication Sig Dispense Refill   acetaminophen (TYLENOL) 500 MG tablet Take 500-1,000 mg by mouth every 8 (eight) hours as needed for moderate pain.     albuterol (VENTOLIN HFA) 108 (90 Base) MCG/ACT inhaler Inhale into the lungs.     Ascorbic Acid (VITAMIN C) 1000 MG tablet Take 1,000 mg by mouth in the morning and at  bedtime.     Calcium Carb-Cholecalciferol (OYSTER SHELL CALCIUM W/D) 500-5 MG-MCG TABS Take 1 tablet by mouth in the morning and at bedtime.     carvedilol (COREG) 12.5 MG tablet Take 1 tablet (12.5 mg total) by mouth 2 (two) times daily. 180 tablet 3   dexamethasone (DECADRON) 4 MG tablet TAKE 5 TABLETS BY MOUTH ONE TIME PER WEEK 20 tablet 4   ELIQUIS 5 MG TABS tablet TAKE 1 TABLET BY MOUTH TWICE A DAY 180 tablet 0   feeding supplement (ENSURE ENLIVE / ENSURE PLUS) LIQD Take 237 mLs by mouth 3 (three) times daily between meals. (Patient taking differently: Take 237 mLs by mouth daily with lunch.) 237 mL 12   gabapentin (NEURONTIN) 300 MG capsule Take 2 capsules (600 mg total) by mouth 3 (three) times daily. 180 capsule 2   ipratropium-albuterol (DUONEB) 0.5-2.5 (3) MG/3ML SOLN Take  3 mLs by nebulization every 4 (four) hours as needed. 360 mL 0   lenalidomide (REVLIMID) 20 MG capsule Take 1 capsule (20 mg total) by mouth daily. Take for 21 days, then hold for 7 days. Repeat every 28 days 21 capsule 0   lidocaine (XYLOCAINE) 5 % ointment Apply topically 3 (three) times daily as needed for mild pain or moderate pain. 50 g 2   morphine (MS CONTIN) 30 MG 12 hr tablet Take 1 tablet (30 mg total) by mouth every 8 (eight) hours. 90 tablet 0   Multiple Vitamin (MULTIVITAMIN WITH MINERALS) TABS tablet Take 1 tablet by mouth daily.     Multiple Vitamins-Minerals (PRESERVISION AREDS PO) Take 1 capsule by mouth in the morning and at bedtime.     mupirocin ointment (BACTROBAN) 2 % Apply 1 Application topically 2 (two) times daily. 22 g 0   naloxone (NARCAN) nasal spray 4 mg/0.1 mL SPRAY 1 SPRAY INTO ONE NOSTRIL AS DIRECTED FOR OPIOID OVERDOSE (TURN PERSON ON SIDE AFTER DOSE. IF NO RESPONSE IN 2-3 MINUTES OR PERSON RESPONDS BUT RELAPSES, REPEAT USING A NEW SPRAY DEVICE AND SPRAY INTO THE OTHER NOSTRIL. CALL 911 AFTER USE.) * EMERGENCY USE ONLY * 1 each 0   omeprazole (PRILOSEC) 20 MG capsule Take 1 capsule (20 mg  total) by mouth daily. 30 capsule 11   Oxycodone HCl 10 MG TABS Take 1 tablet (10 mg total) by mouth every 4 (four) hours as needed (pain). 90 tablet 0   polyethylene glycol (MIRALAX / GLYCOLAX) 17 g packet Take 17 g by mouth 2 (two) times daily.  0   potassium citrate (UROCIT-K) 10 MEQ (1080 MG) SR tablet TAKE 1 TABLET (10 MEQ TOTAL) BY MOUTH 3 (THREE) TIMES DAILY WITH MEALS. 270 tablet 2   senna-docusate (SENOKOT-S) 8.6-50 MG tablet Take 1 tablet by mouth 2 (two) times daily. 30 tablet 0   No current facility-administered medications for this visit.    OBJECTIVE: Vitals:   01/11/23 1342  BP: (!) 144/75  Pulse: 61  Resp: 16  Temp: 98.2 F (36.8 C)  SpO2: 96%        Body mass index is 25.56 kg/m.    ECOG FS:1 - Symptomatic but completely ambulatory  General: Well-developed, well-nourished, no acute distress. Eyes: Pink conjunctiva, anicteric sclera. HEENT: Normocephalic, moist mucous membranes. Lungs: No audible wheezing or coughing. Heart: Regular rate and rhythm. Abdomen: Soft, nontender, no obvious distention. Musculoskeletal: No edema, cyanosis, or clubbing. Neuro: Alert, answering all questions appropriately. Cranial nerves grossly intact. Skin: No rashes or petechiae noted. Psych: Normal affect.  LAB RESULTS:  Lab Results  Component Value Date   NA 134 (L) 01/11/2023   K 4.5 01/11/2023   CL 102 01/11/2023   CO2 23 01/11/2023   GLUCOSE 193 (H) 01/11/2023   BUN 31 (H) 01/11/2023   CREATININE 1.36 (H) 01/11/2023   CALCIUM 8.9 01/11/2023   PROT 6.3 (L) 01/11/2023   ALBUMIN 3.7 01/11/2023   AST 30 01/11/2023   ALT 13 01/11/2023   ALKPHOS 67 01/11/2023   BILITOT 0.4 01/11/2023   GFRNONAA 50 (L) 01/11/2023   GFRAA 29 (L) 10/16/2017    Lab Results  Component Value Date   WBC 2.7 (L) 01/11/2023   NEUTROABS 1.9 01/11/2023   HGB 12.5 (L) 01/11/2023   HCT 38.8 (L) 01/11/2023   MCV 101.0 (H) 01/11/2023   PLT 75 (L) 01/11/2023     STUDIES: NM Bone Scan  Whole Body  Result Date: 12/30/2022 CLINICAL DATA:  Multiple myeloma. EXAM: NUCLEAR MEDICINE WHOLE BODY BONE SCAN TECHNIQUE: Whole body anterior and posterior images were obtained approximately 3 hours after intravenous injection of radiopharmaceutical. RADIOPHARMACEUTICALS:  23.4 mCi Technetium-29m MDP IV COMPARISON:  Pelvic CT 06/10/2022 FINDINGS: Uptake in the LEFT superior pubic ramus corresponds to fracture identified on comparison CT. Mild symmetric uptake posteriorly at the T11 vertebral body is favored degenerative in nature. Mild uptake in the LEFT and RIGHT sacral ala is also favored degenerative. No focal uptake identified to suggest malignancy. IMPRESSION: No evident malignant lesion by MDP whole-body bone imaging. Electronically Signed   By: Genevive Bi M.D.   On: 12/30/2022 14:03    ASSESSMENT: Stage II Kappa chain myeloma.  PLAN:    Stage II kappa chain myeloma: (11:14 translocation, high risk) SPEP essentially negative and immunoglobulins are within normal limits.  Patient's kappa light chains initially improved to greater than 5400 down to 39.1.  They trended up briefly during hospitalization and not taking Revlimid, but now trending back down and the most recent result was 105.2.  Cycle 1 included Velcade and Revlimid, but given his declining performance status at the time and difficulty to get into clinic this was transitioned to Revlimid only.  Given persistent neutropenia, Revlimid was previously dose reduced to 15 mg daily, but patient is now tolerating an increased dose of 20 mg for 21 days with 7 days off.   He is also taking 20 mg dexamethasone weekly.  PET scan results from December 08, 2021 reviewed independently with no obvious evidence of progressive disease.  Patient initiated monthly Xgeva on August 11, 2021.  Patient has been instructed to reinitiate his Revlimid today.  Return to clinic in 4 weeks for repeat laboratory work, further evaluation, and continuation of  treatment.  Appreciate clinical pharmacy input.   Anemia: Chronic and unchanged.  Patient's hemoglobin is 11.3. Right flank mass: MRI results from July 13, 2021 reviewed independently with no obvious pathology on his right flank, but did reveal several fractured ribs.  Continue narcotic regimen as above. Bone lesions: Continue Xgeva as above. Neutropenia: Essentially resolved.  Patient's ANC is 1.5 today.  Continue 20 mg Revlimid as above.   Thrombocytopenia: Chronic and unchanged.  Patient's platelet count is 80 today. Pain: Patient reports his knee might be worse.  Patient only takes long-acting morphine 30 mg every 8 hours.  He also takes Tylenol ibuprofen sparingly.  Previously he was instructed that if his platelets fall below 50 this would need to be discontinued.  Radiation oncology has determined no further treatments are needed at this time.  Appreciate palliative care input. Transaminitis: Resolved.  Patient expressed understanding and was in agreement with this plan. He also understands that He can call clinic at any time with any questions, concerns, or complaints.     Cancer Staging  Kappa light chain myeloma (HCC) Staging form: Plasma Cell Myeloma and Plasma Cell Disorders, AJCC 8th Edition - Clinical stage from 04/16/2021: Albumin (g/dL): 3.8, ISS: Stage II, High-risk cytogenetics: Absent, LDH: Unknown - Signed by Jeralyn Ruths, MD on 04/16/2021 Albumin range (g/dL): Greater than or equal to 3.5 Cytogenetics: t(11;14) translocation Serum calcium level: Normal Serum creatinine level: Normal Bone disease on imaging: Present  Jeralyn Ruths, MD   01/11/2023 2:15 PM

## 2023-01-12 ENCOUNTER — Encounter: Payer: Self-pay | Admitting: Oncology

## 2023-01-12 LAB — KAPPA/LAMBDA LIGHT CHAINS
Kappa free light chain: 95.1 mg/L — ABNORMAL HIGH (ref 3.3–19.4)
Kappa, lambda light chain ratio: 6.94 — ABNORMAL HIGH (ref 0.26–1.65)
Lambda free light chains: 13.7 mg/L (ref 5.7–26.3)

## 2023-01-18 ENCOUNTER — Ambulatory Visit
Admission: RE | Admit: 2023-01-18 | Discharge: 2023-01-18 | Disposition: A | Discharge status: Home / Self Care | Payer: PPO | Source: Ambulatory Visit | Attending: Hospice and Palliative Medicine | Admitting: Hospice and Palliative Medicine

## 2023-01-18 DIAGNOSIS — C9000 Multiple myeloma not having achieved remission: Secondary | ICD-10-CM | POA: Diagnosis not present

## 2023-01-18 MED ORDER — GADOBUTROL 1 MMOL/ML IV SOLN
7.0000 mL | Freq: Once | INTRAVENOUS | Status: AC | PRN
  Administered 2023-01-18: 7 mL via INTRAVENOUS

## 2023-01-20 ENCOUNTER — Telehealth: Payer: PPO | Admitting: Hospice and Palliative Medicine

## 2023-01-20 ENCOUNTER — Ambulatory Visit: Payer: PPO | Attending: Hospice and Palliative Medicine | Admitting: Physical Therapy

## 2023-01-20 DIAGNOSIS — M6281 Muscle weakness (generalized): Secondary | ICD-10-CM | POA: Diagnosis not present

## 2023-01-20 DIAGNOSIS — R262 Difficulty in walking, not elsewhere classified: Secondary | ICD-10-CM | POA: Diagnosis not present

## 2023-01-20 DIAGNOSIS — M5459 Other low back pain: Secondary | ICD-10-CM

## 2023-01-20 DIAGNOSIS — C9000 Multiple myeloma not having achieved remission: Secondary | ICD-10-CM | POA: Diagnosis not present

## 2023-01-20 NOTE — Therapy (Signed)
OUTPATIENT PHYSICAL THERAPY THORACOLUMBAR EVALUATION   Patient Name: Jack [name] MRN: [id number] DOB:Feb 17, 1933, 89 y.o., male Today's Date: 01/20/2023  END OF SESSION:  PT End of Session - 01/20/23 1545     Visit Number 1    Number of Visits 24    Date for PT Re-Evaluation 04/14/23    PT Start Time 1548    PT Stop Time 1649    PT Time Calculation (min) 61 min    Activity Tolerance Patient tolerated treatment well    Behavior During Therapy [facility] for tasks assessed/performed             Past Medical History:  Diagnosis Date   Ascending aortic aneurysm (HCC)    a. 12/2020 Echo: Asc Ao 48mm, Ao root 40mm. b. 03/2022 Asc Ao 4.6 cm (4.5 cm in 2019)   CKD (chronic kidney disease), stage III (HCC)    Diastolic dysfunction    a. 12/2020 Echo: EF 50-55%, no rwma, mild LVH, GrI DD, nl RV fxn, mild AI. Asc Ao 48mm, Ao root 40mm.   Elevated prostate specific antigen (PSA)    has been 7 for a year    GERD (gastroesophageal reflux disease)    History of colon polyps 2008   [facility],    History of kidney stones    Hyperlipidemia    Hypertension    Myeloma (HCC)    PAF (paroxysmal atrial fibrillation) (HCC)    a. 01/2021-->Eliquis (CHA2DS2VASc = 3-4).   Pain    Prostate hypertrophy    diagnosed at age 75 due to hematospermia   Past Surgical History:  Procedure Laterality Date   CATARACT EXTRACTION W/PHACO Left 01/10/2018   Procedure: CATARACT EXTRACTION PHACO AND INTRAOCULAR LENS PLACEMENT (IOC);  Surgeon: Galen Manila, MD;  Location: [facility] ORS;  Service: Ophthalmology;  Laterality: Left;  [name] 00:24.8 AP% 14.9 CDE 3.68 Fluid pack lot # [id number]   CATARACT EXTRACTION W/PHACO Right 01/25/2018   Procedure: CATARACT EXTRACTION PHACO AND INTRAOCULAR LENS PLACEMENT (IOC);  Surgeon: Galen Manila, MD;  Location: [facility];  Service: Ophthalmology;  Laterality: Right;  [name] 00:42 AP% 10.8 CDE 4.59 Fluid pack lot # [id number]   COLON SURGERY     CYSTOSCOPY W/ URETERAL STENT  PLACEMENT Right 10/16/2017   Procedure: right  URETERAL STENT PLACEMENT,cystoscopy bilateral stent removal,rretrograde;  Surgeon: Riki Altes, MD;  Location: [facility] OR[facility];  Service: Urology;  Laterality: Right;   CYSTOSCOPY/URETEROSCOPY/HOLMIUM LASER/STENT PLACEMENT Right 12/16/2020   Procedure: CYSTOSCOPY/URETEROSCOPY/HOLMIUM LASER/STENT PLACEMENT;  Surgeon: Riki Altes, MD;  Location: [facility];  Service: Urology;  Laterality: Right;   EXTRACORPOREAL SHOCK WAVE LITHOTRIPSY Right 12/11/2020   Procedure: EXTRACORPOREAL SHOCK WAVE LITHOTRIPSY (ESWL);  Surgeon: Riki Altes, MD;  Location: [facility];  Service: Urology;  Laterality: Right;   IR BONE TUMOR(S)RF ABLATION  04/16/2022   IR KYPHO EA ADDL LEVEL THORACIC OR LUMBAR  02/02/2021   IR KYPHO LUMBAR INC FX REDUCE BONE BX UNI/BIL CANNULATION INC/IMAGING  02/02/2021   IR KYPHO THORACIC WITH BONE BIOPSY  04/15/2022   KIDNEY STONE SURGERY     KYPHOPLASTY N/A 03/12/2021   Procedure: Nicki Reaper, L3;  Surgeon: Kennedy Bucker, MD;  Location: [facility] OR[facility];  Service: Orthopedics;  Laterality: N/A;   RESECTION SOFT TISSUE TUMOR LEG / ANKLE RADICAL  jan 2009   [facility],  right thigh/knee , nonmalignant   SMALL INTESTINE SURGERY  1946   implaed on picket fence, punctured stomach   TONSILLECTOMY     Patient Active Problem List  Diagnosis Date Noted   Aneurysm of ascending aorta without rupture (HCC) 08/26/2022   Statin myopathy 08/26/2022   Postural dizziness with presyncope 06/11/2022   Pelvic fracture (HCC) 06/10/2022   Multiple myeloma (HCC) 05/05/2021   Compression fracture of body of thoracic vertebra (HCC)    Acute hypoxemic respiratory failure (HCC) 03/26/2021   Right flank pain 03/26/2021   Multiple pathological fractures 03/14/2021   Kappa light chain myeloma (HCC)    Back pain 03/11/2021   Atrial fibrillation (HCC) 03/11/2021   Compression fracture of L2 and L3 03/11/2021   Hyperlipidemia LDL goal <70    Constipation    Palliative care  encounter    Actinic keratosis 02/19/2021   Inguinal hernia 02/19/2021   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   Numbness of foot 02/19/2021   Sciatica 02/19/2021   Shoulder pain 02/19/2021   Palliative care patient 02/17/2021   Closed compression fracture of body of L1 vertebra (HCC) 02/07/2021   Protein-calorie malnutrition, severe 01/31/2021   Unsteadiness on feet 01/20/2021   Chronic kidney disease, stage 3b (HCC) 01/07/2021   Collapsed vertebra, not elsewhere classified, thoracic region, subsequent encounter for fracture with routine healing 01/07/2021   Muscle weakness (generalized) 01/07/2021   Prostate hyperplasia, benign localized, without urinary obstruction 01/07/2021   Nonrheumatic mitral (valve) insufficiency 01/07/2021   Hypokalemia    Normocytic anemia    Atherosclerosis of aorta (HCC)    Irregular heart rhythm    T12 compression fracture (HCC) 01/02/2021   History of kidney stones    Acute kidney injury superimposed on CKD (HCC)    Ureteral stone    Ureteral perforation secondary to stent manipulation 10/16/2017   Abdominal pain 10/16/2017   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/26/2016   Hyperplasia, prostate 02/03/2015   Multinodular goiter 07/03/2014   Thyroid nodule 06/20/2014   Nontoxic uninodular goiter 06/20/2014   Edema 09/24/2013   Encounter for preventive health examination 08/21/2013   Personal history of colonic polyps 08/21/2013   History of venomous snake bite 08/21/2013   Cough 10/02/2012   Prostate hypertrophy    Elevated prostate specific antigen (PSA)    HYPERTRIGLYCERIDEMIA 12/21/2006   Essential hypertension 12/21/2006   GERD 12/21/2006   CALCULUS, KIDNEY 12/21/2006    PCP: Dorothey Baseman, MD   REFERRING PROVIDER: Borders, Daryl Eastern, NP   REFERRING DIAG: C90.00 (ICD-10-CM) - Kappa light chain myeloma (HCC)    Rationale for Evaluation and Treatment: Rehabilitation  THERAPY DIAG:  Other low back pain  Muscle  weakness (generalized)  Difficulty in walking, not elsewhere classified  ONSET DATE: >6 months  SUBJECTIVE:  SUBJECTIVE STATEMENT: Reports that he has had back pain for the last few years. States that pain has been under control with change in medication, but it has increased recently. Pt reports that he hopes for PT to reduce back pain to make pain more manageable   PERTINENT HISTORY:  89 y.o. male with multiple medical problems including hypertension, CKD stage IIIb, A. fib on Eliquis, and anemia.  Patient was hospitalized 01/02/2021 to 01/07/2021 with T12 compression fracture with recommendation for conservative management including use of a TLSO brace.  Patient was hospitalized again 01/29/2021 to 02/07/2021 with recurrent back pain and MR showing T12 and new L1 compression fracture.  Patient underwent T12/L1 kyphoplasty on 02/02/2021.  There was also suspicion of a left fifth rib fracture, which occurred while transitioning from the stretcher for his kyphoplasty.  Patient had mild hypercalcemia with SPEP and UPEP concerning for possible myeloma.  Patient was referred to [facility] for work-up.  He was readmitted 03/11/2021 - 03/17/2021 with worsening back pain.  CT revealed new compression fractures of L2 and L3.  Patient underwent kyphoplasty on 6/30.  He was hospitalized again 03/26/2021-04/07/2021 with severe back/flank pain.  MRI revealed T10 pathologic compression fracture.  He was started on treatment for multiple myeloma with dexamethasone/Velcade and XRT to spine.   PAIN:  Are you having pain? Yes: NPRS scale: 3/10 Pain location:  mid to lower back R >L   Pain description: "pain" Aggravating factors: getting out of bed Relieving factors: Heat, muscle rub,     PRECAUTIONS: Back for comfort   WEIGHT  BEARING RESTRICTIONS: No  FALLS:  Has patient fallen in last 6 months? Yes. [place]   LIVING ENVIRONMENT: Lives with: lives with their spouse Lives in: House/apartment Stairs: No Has following equipment at home: Single point cane  OCCUPATION: retired.   PLOF: Independent, Independent with basic ADLs, and Independent with community mobility without device  Pt reports only recent use of SPC   over the past month   PATIENT GOALS: reduce back pain and improve balance.   NEXT MD VISIT: June 2024  OBJECTIVE:   DIAGNOSTIC FINDINGS:   5/72024: New MRI Pending     CLINICAL DATA:  Multiple myeloma. EXAM: NUCLEAR MEDICINE WHOLE BODY BONE SCAN IMPRESSION: No evident malignant lesion by MDP whole-body bone imaging.    CLINICAL DATA:  Mid to low back pain. History of multiple myeloma. Prior kyphoplasty. EXAM: MRI THORACIC AND LUMBAR SPINE WITHOUT CONTRAST IMPRESSION: 1. Remote compression fractures of T11, T12, L1, L2 and L3, status post vertebral augmentation. No acute abnormality of the thoracic or lumbar spine. 2. Previously described enhancing lesions at T8 and T9 are poorly characterized without contrast. There unenhanced appearance is unchanged. 3. No spinal canal or neural foraminal stenosis.    PATIENT SURVEYS:  FOTO 55  SCREENING FOR RED FLAGS: Bowel or bladder incontinence: No Spinal tumors: No Cauda equina syndrome: No Compression fracture: Yes: multiple kyphoplasty  Abdominal aneurysm: No  COGNITION: Overall cognitive status: Within functional limits for tasks assessed     SENSATION: Light touch: Impaired  and baseline neuropathy in distal BLE to mid shin  in BLE   MUSCLE LENGTH: Hamstrings: Right  deg; Left  deg Maisie Fus test: Right  deg; Left  deg bas POSTURE: rounded shoulders, forward head, and decreased lumbar lordosis  PALPATION: Sensitive to palpation on the lower R side of the back and R SI joint. Noted to have  multiple trigger point to paraspinals  R>L and R side QL.   LUMBAR ROM:   AROM eval  Flexion 10  Extension 10  Right lateral flexion 15  Left lateral flexion 15  Right rotation 40  Left rotation 45   (Blank rows = not tested)  LOWER EXTREMITY ROM:     Active  Right eval Left eval  Hip flexion WFL   Hip extension    Hip abduction    Hip adduction    Hip internal rotation    Hip external rotation    Knee flexion    Knee extension    Ankle dorsiflexion    Ankle plantarflexion    Ankle inversion    Ankle eversion     (Blank rows = not tested)  LOWER EXTREMITY MMT:    MMT Right eval Left eval  Hip flexion 4- 4-  Hip extension 4 4  Hip abduction 4+ 4+  Hip adduction 5 5  Hip internal rotation    Hip external rotation    Knee flexion 5 5  Knee extension 5 5  Ankle dorsiflexion 5 5  Ankle plantarflexion    Ankle inversion    Ankle eversion     (Blank rows = not tested)  LUMBAR SPECIAL TESTS:  Prone instability test: Negative, Slump test: Negative, and SI Compression/distraction test: Negative  FUNCTIONAL TESTS:  5 times sit to stand: 12.9 hands pushing from thights  Timed up and go (TUG): 16.93 3 minute walk test: 651ft  10 meter walk test: TBD Berg Balance Scale: TBD Functional gait assessment: TBD  GAIT: Distance walked:  6 49 Assistive device utilized: Single point cane Level of assistance: Modified independence Comments: decreased hip extension or pelvic rotation   TODAY'S TREATMENT:                                                                                                                              DATE: 01/20/2023  Evaluation.   See below for HEP     PATIENT EDUCATION:  Education details: POC. Pt educated throughout session about proper posture and technique with exercises. Improved exercise technique, movement at target joints, use of target muscles after min to mod verbal, visual, tactile cues.  Person educated: Patient Education  method: Medical illustrator Education comprehension: verbalized understanding  HOME EXERCISE PROGRAM: Access Code: [email or web address] URL: [email or web address] Date: 01/20/2023 Prepared by: Grier Rocher  Exercises - Seated Thoracic Lumbar Extension  - 1 x daily - 7 x weekly - 3 sets - 10 reps - Seated Thoracic Flexion and Rotation with Arms Crossed  - 1 x daily - 7 x weekly - 3 sets - 10 reps  ASSESSMENT:  CLINICAL IMPRESSION: Patient is a 89 y.o. Male who was seen today for physical  therapy evaluation and treatment for worsening LBP on the R>L. No radicular s/s. Has history of multilevel kyphoplasty. Pt demonstrates reduced ROM in lumbar and thoracic spine, focal tenderness in R side paraspinals, SI joint, and QL region.  Decreased strength in BLE as listed above. Decreased community access with 3 min walk test of 649 ft with mild increase LBP. Increased fall risk with 5xSTS of 12.9 sec and Tug of  16.93sec. Pt will benefit from skilled PT to improved balance, strength, spinal ROM, and tolerance to standing as well as reduce Low back pain and improve QoL.    OBJECTIVE IMPAIRMENTS: decreased balance, decreased endurance, decreased mobility, difficulty walking, decreased ROM, decreased strength, hypomobility, impaired perceived functional ability, impaired sensation, improper body mechanics, and postural dysfunction.   ACTIVITY LIMITATIONS: carrying, lifting, standing, squatting, stairs, and locomotion level  PARTICIPATION LIMITATIONS: laundry, shopping, community activity, and yard work  PERSONAL FACTORS: Age and 3+ comorbidities: CA, hypertension, CKD III  are also affecting patient's functional outcome.   REHAB POTENTIAL: Good  CLINICAL DECISION MAKING: Evolving/moderate complexity  EVALUATION COMPLEXITY: Moderate   GOALS: Goals reviewed with patient? No   SHORT TERM GOALS: Target date: 6/120/2024    Patient will be independent in home exercise program to  improve strength/mobility for better functional independence with ADLs. Baseline: initiated on 01/20/2023 Goal status: INITIAL   LONG TERM GOALS: Target date: 04/15/2023    Patient will increase FOTO score to equal to or greater than 60   to demonstrate statistically significant improvement in mobility and quality of life.  Baseline: 55 Goal status: INITIAL  2.  Patient (> 54 years old) will complete five times sit to stand test in < 11 seconds indicating an increased LE strength and improved balance. Baseline: 12.9 Goal status: INITIAL  3.  Patient will increase Berg Balance score by > 6 points to demonstrate decreased fall risk during functional activities Baseline: TBD Goal status: INITIAL  4.  Patient will increase 10 meter walk test to >1.2m/s as to improve gait speed for better community ambulation and to reduce fall risk. Baseline:  Goal status: INITIAL  5.  Patient will reduce timed up and go to <11 seconds to reduce fall risk and demonstrate improved transfer/gait ability. Baseline: 16.9sec Goal status: INITIAL  6.  Patient will increase FGA >20 as to demonstrate reduced fall risk and improved dynamic gait balance for better safety with community/home ambulation.   Baseline: TBD Goal status: INITIAL    PLAN:  PT FREQUENCY: 1-2x/week  PT DURATION: 12 weeks  PLANNED INTERVENTIONS: Therapeutic exercises, Therapeutic activity, Neuromuscular re-education, Balance training, Gait training, Patient/Family education, Self Care, Joint mobilization, Joint manipulation, Stair training, DME instructions, Dry Needling, Spinal mobilization, Moist heat, scar mobilization, and Manual therapy.  PLAN FOR NEXT SESSION:  Complete Berg and FGA. Reassess HEP and adjust as needed. Golden Pop, PT 01/20/2023, 3:51 PM

## 2023-01-22 ENCOUNTER — Other Ambulatory Visit: Payer: Self-pay | Admitting: Oncology

## 2023-01-22 DIAGNOSIS — C9000 Multiple myeloma not having achieved remission: Secondary | ICD-10-CM

## 2023-01-24 ENCOUNTER — Other Ambulatory Visit: Payer: Self-pay | Admitting: *Deleted

## 2023-01-24 ENCOUNTER — Encounter: Payer: Self-pay | Admitting: Oncology

## 2023-01-24 ENCOUNTER — Encounter: Payer: Self-pay | Admitting: Physical Therapy

## 2023-01-24 ENCOUNTER — Other Ambulatory Visit: Payer: Self-pay | Admitting: Hospice and Palliative Medicine

## 2023-01-24 ENCOUNTER — Ambulatory Visit: Payer: PPO | Admitting: Physical Therapy

## 2023-01-24 DIAGNOSIS — M5459 Other low back pain: Secondary | ICD-10-CM

## 2023-01-24 DIAGNOSIS — C9000 Multiple myeloma not having achieved remission: Secondary | ICD-10-CM

## 2023-01-24 DIAGNOSIS — R262 Difficulty in walking, not elsewhere classified: Secondary | ICD-10-CM

## 2023-01-24 DIAGNOSIS — M6281 Muscle weakness (generalized): Secondary | ICD-10-CM

## 2023-01-24 MED ORDER — MORPHINE SULFATE ER 30 MG PO TBCR: 30.0000 mg | EXTENDED_RELEASE_TABLET | Freq: Three times a day (TID) | ORAL | 0 refills | Status: DC

## 2023-01-24 MED ORDER — OXYCODONE HCL 10 MG PO TABS: 10.0000 mg | ORAL_TABLET | ORAL | 0 refills | Status: DC | PRN

## 2023-01-24 MED ORDER — APIXABAN 5 MG PO TABS
5.0000 mg | ORAL_TABLET | Freq: Two times a day (BID) | ORAL | 0 refills | Status: DC
Start: 2023-01-24 — End: 2023-04-21

## 2023-01-24 NOTE — Progress Notes (Signed)
Patient and wife regarding MRI results.  No significant changes noted on imaging.  Refills requested MS Contin and oxycodone and Rx sent to pharmacy.

## 2023-01-24 NOTE — Therapy (Signed)
OUTPATIENT PHYSICAL THERAPY THORACOLUMBAR EVALUATION   Patient Name: Jack [name] MRN: [id number] DOB:1933-06-19, 89 y.o., male Today's Date: 01/24/2023  END OF SESSION:  PT End of Session - 01/24/23 1302     Visit Number 2    Number of Visits 24    Date for PT Re-Evaluation 04/14/23    PT Start Time 1303    PT Stop Time 1350    PT Time Calculation (min) 47 min    Activity Tolerance Patient tolerated treatment well    Behavior During Therapy [facility] for tasks assessed/performed             Past Medical History:  Diagnosis Date   Ascending aortic aneurysm (HCC)    a. 12/2020 Echo: Asc Ao 48mm, Ao root 40mm. b. 03/2022 Asc Ao 4.6 cm (4.5 cm in 2019)   CKD (chronic kidney disease), stage III (HCC)    Diastolic dysfunction    a. 12/2020 Echo: EF 50-55%, no rwma, mild LVH, GrI DD, nl RV fxn, mild AI. Asc Ao 48mm, Ao root 40mm.   Elevated prostate specific antigen (PSA)    has been 7 for a year    GERD (gastroesophageal reflux disease)    History of colon polyps 2008   [facility],    History of kidney stones    Hyperlipidemia    Hypertension    Myeloma (HCC)    PAF (paroxysmal atrial fibrillation) (HCC)    a. 01/2021-->Eliquis (CHA2DS2VASc = 3-4).   Pain    Prostate hypertrophy    diagnosed at age 67 due to hematospermia   Past Surgical History:  Procedure Laterality Date   CATARACT EXTRACTION W/PHACO Left 01/10/2018   Procedure: CATARACT EXTRACTION PHACO AND INTRAOCULAR LENS PLACEMENT (IOC);  Surgeon: Galen Manila, MD;  Location: [facility] ORS;  Service: Ophthalmology;  Laterality: Left;  [name] 00:24.8 AP% 14.9 CDE 3.68 Fluid pack lot # [id number]   CATARACT EXTRACTION W/PHACO Right 01/25/2018   Procedure: CATARACT EXTRACTION PHACO AND INTRAOCULAR LENS PLACEMENT (IOC);  Surgeon: Galen Manila, MD;  Location: [facility] ORS;  Service: Ophthalmology;  Laterality: Right;  [name] 00:42 AP% 10.8 CDE 4.59 Fluid pack lot # [id number]   COLON SURGERY     CYSTOSCOPY W/ URETERAL STENT  PLACEMENT Right 10/16/2017   Procedure: right  URETERAL STENT PLACEMENT,cystoscopy bilateral stent removal,rretrograde;  Surgeon: Riki Altes, MD;  Location: [facility] ORS;  Service: Urology;  Laterality: Right;   CYSTOSCOPY/URETEROSCOPY/HOLMIUM LASER/STENT PLACEMENT Right 12/16/2020   Procedure: CYSTOSCOPY/URETEROSCOPY/HOLMIUM LASER/STENT PLACEMENT;  Surgeon: Riki Altes, MD;  Location: [facility] OR[facility];  Service: Urology;  Laterality: Right;   EXTRACORPOREAL SHOCK WAVE LITHOTRIPSY Right 12/11/2020   Procedure: EXTRACORPOREAL SHOCK WAVE LITHOTRIPSY (ESWL);  Surgeon: Riki Altes, MD;  Location: [facility] ORS;  Service: Urology;  Laterality: Right;   IR BONE TUMOR(S)RF ABLATION  04/16/2022   IR KYPHO EA ADDL LEVEL THORACIC OR LUMBAR  02/02/2021   IR KYPHO LUMBAR INC FX REDUCE BONE BX UNI/BIL CANNULATION INC/IMAGING  02/02/2021   IR KYPHO THORACIC WITH BONE BIOPSY  04/15/2022   KIDNEY STONE SURGERY     KYPHOPLASTY N/A 03/12/2021   Procedure: Nicki Reaper, L3;  Surgeon: Kennedy Bucker, MD;  Location: [facility] ORS;  Service: Orthopedics;  Laterality: N/A;   RESECTION SOFT TISSUE TUMOR LEG / ANKLE RADICAL  jan 2009   [facility],  right thigh/knee , nonmalignant   SMALL INTESTINE SURGERY  1946   implaed on picket fence, punctured stomach   TONSILLECTOMY     Patient Active Problem List  Diagnosis Date Noted   Aneurysm of ascending aorta without rupture (HCC) 08/26/2022   Statin myopathy 08/26/2022   Postural dizziness with presyncope 06/11/2022   Pelvic fracture (HCC) 06/10/2022   Multiple myeloma (HCC) 05/05/2021   Compression fracture of body of thoracic vertebra (HCC)    Acute hypoxemic respiratory failure (HCC) 03/26/2021   Right flank pain 03/26/2021   Multiple pathological fractures 03/14/2021   Kappa light chain myeloma (HCC)    Back pain 03/11/2021   Atrial fibrillation (HCC) 03/11/2021   Compression fracture of L2 and L3 03/11/2021   Hyperlipidemia LDL goal <70    Constipation    Palliative care  encounter    Actinic keratosis 02/19/2021   Inguinal hernia 02/19/2021   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   Numbness of foot 02/19/2021   Sciatica 02/19/2021   Shoulder pain 02/19/2021   Palliative care patient 02/17/2021   Closed compression fracture of body of L1 vertebra (HCC) 02/07/2021   Protein-calorie malnutrition, severe 01/31/2021   Unsteadiness on feet 01/20/2021   Chronic kidney disease, stage 3b (HCC) 01/07/2021   Collapsed vertebra, not elsewhere classified, thoracic region, subsequent encounter for fracture with routine healing 01/07/2021   Muscle weakness (generalized) 01/07/2021   Prostate hyperplasia, benign localized, without urinary obstruction 01/07/2021   Nonrheumatic mitral (valve) insufficiency 01/07/2021   Hypokalemia    Normocytic anemia    Atherosclerosis of aorta (HCC)    Irregular heart rhythm    T12 compression fracture (HCC) 01/02/2021   History of kidney stones    Acute kidney injury superimposed on CKD (HCC)    Ureteral stone    Ureteral perforation secondary to stent manipulation 10/16/2017   Abdominal pain 10/16/2017   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/26/2016   Hyperplasia, prostate 02/03/2015   Multinodular goiter 07/03/2014   Thyroid nodule 06/20/2014   Nontoxic uninodular goiter 06/20/2014   Edema 09/24/2013   Encounter for preventive health examination 08/21/2013   Personal history of colonic polyps 08/21/2013   History of venomous snake bite 08/21/2013   Cough 10/02/2012   Prostate hypertrophy    Elevated prostate specific antigen (PSA)    HYPERTRIGLYCERIDEMIA 12/21/2006   Essential hypertension 12/21/2006   GERD 12/21/2006   CALCULUS, KIDNEY 12/21/2006    PCP: Dorothey Baseman, MD   REFERRING PROVIDER: Borders, Daryl Eastern, NP   REFERRING DIAG: C90.00 (ICD-10-CM) - Kappa light chain myeloma (HCC)    Rationale for Evaluation and Treatment: Rehabilitation  THERAPY DIAG:  Other low back pain  Muscle  weakness (generalized)  Difficulty in walking, not elsewhere classified  ONSET DATE: >6 months  SUBJECTIVE:  SUBJECTIVE STATEMENT: Pt Reports that he has had a "rough" couple of days. Reports feeling worn out and fatigued with little motivation to get out or bed and reduced appetite. States that he has not returned to "normal" yet. States that he had heat on mid low back prior to session with mild relief.   PERTINENT HISTORY:  89 y.o. male with multiple medical problems including hypertension, CKD stage IIIb, A. fib on Eliquis, and anemia.  Patient was hospitalized 01/02/2021 to 01/07/2021 with T12 compression fracture with recommendation for conservative management including use of a TLSO brace.  Patient was hospitalized again 01/29/2021 to 02/07/2021 with recurrent back pain and MR showing T12 and new L1 compression fracture.  Patient underwent T12/L1 kyphoplasty on 02/02/2021.  There was also suspicion of a left fifth rib fracture, which occurred while transitioning from the stretcher for his kyphoplasty.  Patient had mild hypercalcemia with SPEP and UPEP concerning for possible myeloma.  Patient was referred to [facility] for work-up.  He was readmitted 03/11/2021 - 03/17/2021 with worsening back pain.  CT revealed new compression fractures of L2 and L3.  Patient underwent kyphoplasty on 6/30.  He was hospitalized again 03/26/2021-04/07/2021 with severe back/flank pain.  MRI revealed T10 pathologic compression fracture.  He was started on treatment for multiple myeloma with dexamethasone/Velcade and XRT to spine.   PAIN:  Are you having pain? Yes: NPRS scale: 4/10 Pain location:  mid to lower back R >L   Pain description: "pain" Aggravating factors: getting out of bed Relieving factors: Heat, muscle rub,      PRECAUTIONS: Back for comfort   WEIGHT BEARING RESTRICTIONS: No  FALLS:  Has patient fallen in last 6 months? Yes. [place]   LIVING ENVIRONMENT: Lives with: lives with their spouse Lives in: House/apartment Stairs: No Has following equipment at home: Single point cane  OCCUPATION: retired.   PLOF: Independent, Independent with basic ADLs, and Independent with community mobility without device  Pt reports only recent use of SPC   over the past month   PATIENT GOALS: reduce back pain and improve balance.   NEXT MD VISIT: June 2024  OBJECTIVE:   DIAGNOSTIC FINDINGS:   [id number]/72024: New MRI Pending     CLINICAL DATA:  Multiple myeloma. EXAM: NUCLEAR MEDICINE WHOLE BODY BONE SCAN IMPRESSION: No evident malignant lesion by MDP whole-body bone imaging.    CLINICAL DATA:  Mid to low back pain. History of multiple myeloma. Prior kyphoplasty. EXAM: MRI THORACIC AND LUMBAR SPINE WITHOUT CONTRAST IMPRESSION: 1. Remote compression fractures of T11, T12, L1, L2 and L3, status post vertebral augmentation. No acute abnormality of the thoracic or lumbar spine. 2. Previously described enhancing lesions at T8 and T9 are poorly characterized without contrast. There unenhanced appearance is unchanged. 3. No spinal canal or neural foraminal stenosis.    PATIENT SURVEYS:  FOTO 55  SCREENING FOR RED FLAGS: Bowel or bladder incontinence: No Spinal tumors: No Cauda equina syndrome: No Compression fracture: Yes: multiple kyphoplasty  Abdominal aneurysm: No  COGNITION: Overall cognitive status: Within functional limits for tasks assessed     SENSATION: Light touch: Impaired  and baseline neuropathy in distal BLE to mid shin  in BLE   MUSCLE LENGTH: Hamstrings: Right  deg; Left  deg Maisie Fus test: Right  deg; Left  deg bas POSTURE: rounded shoulders, forward head, and decreased lumbar lordosis  PALPATION: Sensitive to palpation on the lower R side of  the back and R SI joint.  Noted to have multiple trigger point to paraspinals R>L and R side QL.   LUMBAR ROM:   AROM eval  Flexion 10  Extension 10  Right lateral flexion 15  Left lateral flexion 15  Right rotation 40  Left rotation 45   (Blank rows = not tested)  LOWER EXTREMITY ROM:     Active  Right eval Left eval  Hip flexion WFL   Hip extension    Hip abduction    Hip adduction    Hip internal rotation    Hip external rotation    Knee flexion    Knee extension    Ankle dorsiflexion    Ankle plantarflexion    Ankle inversion    Ankle eversion     (Blank rows = not tested)  LOWER EXTREMITY MMT:    MMT Right eval Left eval  Hip flexion 4- 4-  Hip extension 4 4  Hip abduction 4+ 4+  Hip adduction 5 5  Hip internal rotation    Hip external rotation    Knee flexion 5 5  Knee extension 5 5  Ankle dorsiflexion 5 5  Ankle plantarflexion    Ankle inversion    Ankle eversion     (Blank rows = not tested)  LUMBAR SPECIAL TESTS:  Prone instability test: Negative, Slump test: Negative, and SI Compression/distraction test: Negative  FUNCTIONAL TESTS:  5 times sit to stand: 12.9 hands pushing from thights  Timed up and go (TUG): 16.93 3 minute walk test: 613ft  10 meter walk test: 0.9 m/s  Berg Balance Scale: 46/56 Functional gait assessment: 22/30  GAIT: Distance walked:  649 Assistive device utilized: Single point cane Level of assistance: Modified independence Comments: decreased hip extension or pelvic rotation   TODAY'S TREATMENT:                                                                                                                              DATE: 01/24/2023  PT instructed pt in completion of balance assessment and therex for R>L LBP.   Patient demonstrates increased fall risk as noted by score of  43 /56 on Berg Balance Scale.  (<36= high risk for falls, close to 100%; 37-45 significant >80%; 46-51 moderate >50%; 52-55 lower >25%)  10  Meter Walk Test: Patient instructed to walk 10 meters (32.8 ft) as quickly and as safely as possible at their normal speed x2 and at a fast speed x2. Time measured from 2 meter mark to 8 meter mark to accommodate ramp-up and ramp-down.  Normal speed 1: 11.32sec m/s Normal speed 2: 10.71sec m/s Average Normal speed: 11.015sec 0.9sec m/s Cut off scores: <0.4 m/s = household Ambulator, 0.4-0.8 m/s = limited community Ambulator, >0.8 m/s = community Ambulator, >1.2 m/s = crossing a street, <1.0 = increased fall risk MCID 0.05 m/s (small), 0.13 m/s (moderate), 0.06 m/s (significant)  (ANPTA Core Set of Outcome Measures for Adults with Neurologic Conditions, 2018)  Patient  demonstrates increased fall risk as noted by score of 22/30 on  Functional Gait Assessment.   <22/30 = predictive of falls, <20/30 = fall in 6 months, <18/30 = predictive of falls in PD MCID: 5 points stroke population, 4 points geriatric population (ANPTA Core Set of Outcome Measures for Adults with Neurologic Conditions, 2018)  Pt performed sit<>supine with supervision assist but pain In R side of low back. Moist heat applied to R side of low back while in supine. LTR 2 x 1 min with cues to remain in pain free range.   PT performed STM with trigger point release to R side paraspinals x 2 min with mild reduction in LPB.      [facility] PT Assessment - 01/24/23 0001       Berg Balance Test   Sit to Stand Able to stand without using hands and stabilize independently    Standing Unsupported Able to stand safely 2 minutes    Sitting with Back Unsupported but Feet Supported on Floor or Stool Able to sit safely and securely 2 minutes    Stand to Sit Controls descent by using hands    Transfers Able to transfer safely, definite need of hands    Standing Unsupported with Eyes Closed Able to stand 10 seconds safely    Standing Unsupported with Feet Together Able to place feet together independently and stand 1 minute safely    From  Standing, Reach Forward with Outstretched Arm Can reach forward >12 cm safely (5")    From Standing Position, Pick up Object from Floor Able to pick up shoe, needs supervision    From Standing Position, Turn to Look Behind Over each Shoulder Turn sideways only but maintains balance    Turn 360 Degrees Able to turn 360 degrees safely but slowly    Standing Unsupported, Alternately Place Feet on Step/Stool Able to stand independently and complete 8 steps >20 seconds    Standing Unsupported, One Foot in Front Able to take small step independently and hold 30 seconds    Standing on One Leg Able to lift leg independently and hold equal to or more than 3 seconds    Total Score 43      Functional Gait  Assessment   Gait Level Surface Walks 20 ft in less than 5.5 sec, no assistive devices, good speed, no evidence for imbalance, normal gait pattern, deviates no more than 6 in outside of the 12 in walkway width.    Change in Gait Speed Able to smoothly change walking speed without loss of balance or gait deviation. Deviate no more than 6 in outside of the 12 in walkway width.    Gait with Horizontal Head Turns Performs head turns smoothly with slight change in gait velocity (eg, minor disruption to smooth gait path), deviates 6-10 in outside 12 in walkway width, or uses an assistive device.    Gait with Vertical Head Turns Performs head turns with no change in gait. Deviates no more than 6 in outside 12 in walkway width.    Gait and Pivot Turn Pivot turns safely within 3 sec and stops quickly with no loss of balance.    Step Over Obstacle Is able to step over one shoe box (4.5 in total height) without changing gait speed. No evidence of imbalance.    Gait with Narrow Base of Support Ambulates 4-7 steps.    Gait with Eyes Closed Cannot walk 20 ft without assistance, severe gait deviations or imbalance, deviates greater than  15 in outside 12 in walkway width or will not attempt task.    Ambulating Backwards  Walks 20 ft, no assistive devices, good speed, no evidence for imbalance, normal gait    Steps Alternating feet, must use rail.    Total Score 22               PATIENT EDUCATION:  Education details: POC. Pt educated throughout session about proper posture and technique with exercises. Improved exercise technique, movement at target joints, use of target muscles after min to mod verbal, visual, tactile cues.  Person educated: Patient Education method: Medical illustrator Education comprehension: verbalized understanding  HOME EXERCISE PROGRAM: Access Code: [email or web address] URL: [email or web address] Date: 01/20/2023 Prepared by: Grier Rocher  Exercises - Seated Thoracic Lumbar Extension  - 1 x daily - 7 x weekly - 3 sets - 10 reps - Seated Thoracic Flexion and Rotation with Arms Crossed  - 1 x daily - 7 x weekly - 3 sets - 10 reps  ASSESSMENT:  CLINICAL IMPRESSION: Patient presents to PT treatment with good effort throughout session, but reports feeling off today. Pt demonstrates increased fall risk as evidenced through [place] of 43, FGA 22/30, and gait speed of 0/9 m/s indicating reduced community access.  Pt reports mild reduction in pain following manual therapy for trigger point release to R side paraspinal at T11-T12. Pt will benefit from skilled PT to improved balance, strength, spinal ROM, and tolerance to standing as well as reduce Low back pain and improve QoL.    OBJECTIVE IMPAIRMENTS: decreased balance, decreased endurance, decreased mobility, difficulty walking, decreased ROM, decreased strength, hypomobility, impaired perceived functional ability, impaired sensation, improper body mechanics, and postural dysfunction.   ACTIVITY LIMITATIONS: carrying, lifting, standing, squatting, stairs, and locomotion level  PARTICIPATION LIMITATIONS: laundry, shopping, community activity, and yard work  PERSONAL FACTORS: Age and 3+ comorbidities: CA, hypertension,  CKD III  are also affecting patient's functional outcome.   REHAB POTENTIAL: Good  CLINICAL DECISION MAKING: Evolving/moderate complexity  EVALUATION COMPLEXITY: Moderate   GOALS: Goals reviewed with patient? No   SHORT TERM GOALS: Target date: 6/120/2024    Patient will be independent in home exercise program to improve strength/mobility for better functional independence with ADLs. Baseline: initiated on 01/20/2023 Goal status: INITIAL   LONG TERM GOALS: Target date: 04/15/2023    Patient will increase FOTO score to equal to or greater than 60   to demonstrate statistically significant improvement in mobility and quality of life.  Baseline: 55 Goal status: INITIAL  2.  Patient (> 29 years old) will complete five times sit to stand test in < 11 seconds indicating an increased LE strength and improved balance. Baseline: 12.9 Goal status: INITIAL  3.  Patient will increase Berg Balance score by > 6 points to demonstrate decreased fall risk during functional activities Baseline: 43 Goal status: INITIAL  4.  Patient will increase 10 meter walk test to >1.20m/s as to improve gait speed for better community ambulation and to reduce fall risk. Baseline: 0.9 Goal status: INITIAL  5.  Patient will reduce timed up and go to <11 seconds to reduce fall risk and demonstrate improved transfer/gait ability. Baseline: 16.9sec Goal status: INITIAL  6.  Patient will increase FGA >4points as to demonstrate reduced fall risk and improved dynamic gait balance for better safety with community/home ambulation.   Baseline: 22 Goal status: INITIAL    PLAN:  PT FREQUENCY: 1-2x/week  PT DURATION: 12 weeks  PLANNED INTERVENTIONS: Therapeutic exercises, Therapeutic activity,  Neuromuscular re-education, Balance training, Gait training, Patient/Family education, Self Care, Joint mobilization, Joint manipulation, Stair training, DME instructions, Dry Needling, Spinal mobilization, Moist heat,  scar mobilization, and Manual therapy.  PLAN FOR NEXT SESSION:  Manual for pain modulation. Gentle therex of low/mid back ROM. Balance and BLE strength training. Reassess HEP and adjust as needed.     Grier Rocher PT, DPT  Physical Therapist - [facility]  5:06 PM 01/24/23

## 2023-01-27 ENCOUNTER — Encounter: Payer: PPO | Admitting: Physical Therapy

## 2023-01-28 ENCOUNTER — Ambulatory Visit: Payer: PPO | Admitting: Physical Therapy

## 2023-01-28 DIAGNOSIS — M5459 Other low back pain: Secondary | ICD-10-CM | POA: Diagnosis not present

## 2023-01-28 DIAGNOSIS — M6281 Muscle weakness (generalized): Secondary | ICD-10-CM

## 2023-01-28 DIAGNOSIS — R262 Difficulty in walking, not elsewhere classified: Secondary | ICD-10-CM

## 2023-01-28 NOTE — Therapy (Signed)
OUTPATIENT PHYSICAL THERAPY THORACOLUMBAR EVALUATION   Patient Name: Jack [name] MRN: [id number] DOB:11/10/1932, 89 y.o., male Today's Date: 01/28/2023  END OF SESSION:  PT End of Session - 01/28/23 1020     Visit Number 3    Number of Visits 24    Date for PT Re-Evaluation 04/14/23    PT Start Time 1021    PT Stop Time 1103    PT Time Calculation (min) 42 min    Activity Tolerance Patient tolerated treatment well    Behavior During Therapy [facility] for tasks assessed/performed             Past Medical History:  Diagnosis Date   Ascending aortic aneurysm (HCC)    a. 12/2020 Echo: Asc Ao 48mm, Ao root 40mm. b. 03/2022 Asc Ao 4.6 cm (4.5 cm in 2019)   CKD (chronic kidney disease), stage III (HCC)    Diastolic dysfunction    a. 12/2020 Echo: EF 50-55%, no rwma, mild LVH, GrI DD, nl RV fxn, mild AI. Asc Ao 48mm, Ao root 40mm.   Elevated prostate specific antigen (PSA)    has been 7 for a year    GERD (gastroesophageal reflux disease)    History of colon polyps 2008   [facility],    History of kidney stones    Hyperlipidemia    Hypertension    Myeloma (HCC)    PAF (paroxysmal atrial fibrillation) (HCC)    a. 01/2021-->Eliquis (CHA2DS2VASc = 3-4).   Pain    Prostate hypertrophy    diagnosed at age 40 due to hematospermia   Past Surgical History:  Procedure Laterality Date   CATARACT EXTRACTION W/PHACO Left 01/10/2018   Procedure: CATARACT EXTRACTION PHACO AND INTRAOCULAR LENS PLACEMENT (IOC);  Surgeon: Galen Manila, MD;  Location: [facility] OR[facility];  Service: Ophthalmology;  Laterality: Left;  [name] 00:24.8 AP% 14.9 CDE 3.68 Fluid pack lot # [id number]   CATARACT EXTRACTION W/PHACO Right 01/25/2018   Procedure: CATARACT EXTRACTION PHACO AND INTRAOCULAR LENS PLACEMENT (IOC);  Surgeon: Galen Manila, MD;  Location: [facility];  Service: Ophthalmology;  Laterality: Right;  [name] 00:42 AP% 10.8 CDE 4.59 Fluid pack lot # [id number]   COLON SURGERY     CYSTOSCOPY W/ URETERAL STENT  PLACEMENT Right 10/16/2017   Procedure: right  URETERAL STENT PLACEMENT,cystoscopy bilateral stent removal,rretrograde;  Surgeon: Riki Altes, MD;  Location: [facility];  Service: Urology;  Laterality: Right;   CYSTOSCOPY/URETEROSCOPY/HOLMIUM LASER/STENT PLACEMENT Right 12/16/2020   Procedure: CYSTOSCOPY/URETEROSCOPY/HOLMIUM LASER/STENT PLACEMENT;  Surgeon: Riki Altes, MD;  Location: [facility];  Service: Urology;  Laterality: Right;   EXTRACORPOREAL SHOCK WAVE LITHOTRIPSY Right 12/11/2020   Procedure: EXTRACORPOREAL SHOCK WAVE LITHOTRIPSY (ESWL);  Surgeon: Riki Altes, MD;  Location: [facility];  Service: Urology;  Laterality: Right;   IR BONE TUMOR(S)RF ABLATION  04/16/2022   IR KYPHO EA ADDL LEVEL THORACIC OR LUMBAR  02/02/2021   IR KYPHO LUMBAR INC FX REDUCE BONE BX UNI/BIL CANNULATION INC/IMAGING  02/02/2021   IR KYPHO THORACIC WITH BONE BIOPSY  04/15/2022   KIDNEY STONE SURGERY     KYPHOPLASTY N/A 03/12/2021   Procedure: Nicki Reaper, L3;  Surgeon: Kennedy Bucker, MD;  Location: [facility];  Service: Orthopedics;  Laterality: N/A;   RESECTION SOFT TISSUE TUMOR LEG / ANKLE RADICAL  jan 2009   [facility],  right thigh/knee , nonmalignant   SMALL INTESTINE SURGERY  1946   implaed on picket fence, punctured stomach   TONSILLECTOMY     Patient Active Problem List  Diagnosis Date Noted   Aneurysm of ascending aorta without rupture (HCC) 08/26/2022   Statin myopathy 08/26/2022   Postural dizziness with presyncope 06/11/2022   Pelvic fracture (HCC) 06/10/2022   Multiple myeloma (HCC) 05/05/2021   Compression fracture of body of thoracic vertebra (HCC)    Acute hypoxemic respiratory failure (HCC) 03/26/2021   Right flank pain 03/26/2021   Multiple pathological fractures 03/14/2021   Kappa light chain myeloma (HCC)    Back pain 03/11/2021   Atrial fibrillation (HCC) 03/11/2021   Compression fracture of L2 and L3 03/11/2021   Hyperlipidemia LDL goal <70    Constipation    Palliative care  encounter    Actinic keratosis 02/19/2021   Inguinal hernia 02/19/2021   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   Numbness of foot 02/19/2021   Sciatica 02/19/2021   Shoulder pain 02/19/2021   Palliative care patient 02/17/2021   Closed compression fracture of body of L1 vertebra (HCC) 02/07/2021   Protein-calorie malnutrition, severe 01/31/2021   Unsteadiness on feet 01/20/2021   Chronic kidney disease, stage 3b (HCC) 01/07/2021   Collapsed vertebra, not elsewhere classified, thoracic region, subsequent encounter for fracture with routine healing 01/07/2021   Muscle weakness (generalized) 01/07/2021   Prostate hyperplasia, benign localized, without urinary obstruction 01/07/2021   Nonrheumatic mitral (valve) insufficiency 01/07/2021   Hypokalemia    Normocytic anemia    Atherosclerosis of aorta (HCC)    Irregular heart rhythm    T12 compression fracture (HCC) 01/02/2021   History of kidney stones    Acute kidney injury superimposed on CKD (HCC)    Ureteral stone    Ureteral perforation secondary to stent manipulation 10/16/2017   Abdominal pain 10/16/2017   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/26/2016   Hyperplasia, prostate 02/03/2015   Multinodular goiter 07/03/2014   Thyroid nodule 06/20/2014   Nontoxic uninodular goiter 06/20/2014   Edema 09/24/2013   Encounter for preventive health examination 08/21/2013   Personal history of colonic polyps 08/21/2013   History of venomous snake bite 08/21/2013   Cough 10/02/2012   Prostate hypertrophy    Elevated prostate specific antigen (PSA)    HYPERTRIGLYCERIDEMIA 12/21/2006   Essential hypertension 12/21/2006   GERD 12/21/2006   CALCULUS, KIDNEY 12/21/2006    PCP: Dorothey Baseman, MD   REFERRING PROVIDER: Borders, Daryl Eastern, NP   REFERRING DIAG: C90.00 (ICD-10-CM) - Kappa light chain myeloma (HCC)    Rationale for Evaluation and Treatment: Rehabilitation  THERAPY DIAG:  Muscle weakness  (generalized)  Other low back pain  Difficulty in walking, not elsewhere classified  ONSET DATE: >6 months  SUBJECTIVE:  SUBJECTIVE STATEMENT: Pt reports that he is not doing well today. States that he has tried to perform HEP as directed for the last couple of days, and states that when he performs HEP, the next day he is very sore and drained. R side of low back is 3/10 at rest, but he is very tired this morning. Noted to have bandage on the back of the L hand, states that he hit hand on the back of the bed a couple of days ago, but it is getting better.    PERTINENT HISTORY:  89 y.o. male with multiple medical problems including hypertension, CKD stage IIIb, A. fib on Eliquis, and anemia.  Patient was hospitalized 01/02/2021 to 01/07/2021 with T12 compression fracture with recommendation for conservative management including use of a TLSO brace.  Patient was hospitalized again 01/29/2021 to 02/07/2021 with recurrent back pain and MR showing T12 and new L1 compression fracture.  Patient underwent T12/L1 kyphoplasty on 02/02/2021.  There was also suspicion of a left fifth rib fracture, which occurred while transitioning from the stretcher for his kyphoplasty.  Patient had mild hypercalcemia with SPEP and UPEP concerning for possible myeloma.  Patient was referred to [facility] for work-up.  He was readmitted 03/11/2021 - 03/17/2021 with worsening back pain.  CT revealed new compression fractures of L2 and L3.  Patient underwent kyphoplasty on 6/30.  He was hospitalized again 03/26/2021-04/07/2021 with severe back/flank pain.  MRI revealed T10 pathologic compression fracture.  He was started on treatment for multiple myeloma with dexamethasone/Velcade and XRT to spine.   PAIN:  Are you having pain? Yes: NPRS scale:  4/10 Pain location:  mid to lower back R >L   Pain description: "pain" Aggravating factors: getting out of bed Relieving factors: Heat, muscle rub,     PRECAUTIONS: Back for comfort   WEIGHT BEARING RESTRICTIONS: No  FALLS:  Has patient fallen in last 6 months? Yes. [place]   LIVING ENVIRONMENT: Lives with: lives with their spouse Lives in: House/apartment Stairs: No Has following equipment at home: Single point cane  OCCUPATION: retired.   PLOF: Independent, Independent with basic ADLs, and Independent with community mobility without device  Pt reports only recent use of SPC   over the past month   PATIENT GOALS: reduce back pain and improve balance.   NEXT MD VISIT: June 2024  OBJECTIVE:   DIAGNOSTIC FINDINGS:   [id number]/72024: New MRI Pending     CLINICAL DATA:  Multiple myeloma. EXAM: NUCLEAR MEDICINE WHOLE BODY BONE SCAN IMPRESSION: No evident malignant lesion by MDP whole-body bone imaging.    CLINICAL DATA:  Mid to low back pain. History of multiple myeloma. Prior kyphoplasty. EXAM: MRI THORACIC AND LUMBAR SPINE WITHOUT CONTRAST IMPRESSION: 1. Remote compression fractures of T11, T12, L1, L2 and L3, status post vertebral augmentation. No acute abnormality of the thoracic or lumbar spine. 2. Previously described enhancing lesions at T8 and T9 are poorly characterized without contrast. There unenhanced appearance is unchanged. 3. No spinal canal or neural foraminal stenosis.    PATIENT SURVEYS:  FOTO 55  SCREENING FOR RED FLAGS: Bowel or bladder incontinence: No Spinal tumors: No Cauda equina syndrome: No Compression fracture: Yes: multiple kyphoplasty  Abdominal aneurysm: No  COGNITION: Overall cognitive status: Within functional limits for tasks assessed     SENSATION: Light touch: Impaired  and baseline neuropathy in distal BLE to mid shin  in BLE   MUSCLE LENGTH: Hamstrings: Right  deg; Left  deg Maisie Fus test: Right   deg; Left  deg bas POSTURE: rounded shoulders, forward head, and decreased lumbar lordosis  PALPATION: Sensitive to palpation on the lower R side of the back and R SI joint. Noted to have multiple trigger point to paraspinals R>L and R side QL.   LUMBAR ROM:   AROM eval  Flexion 10  Extension 10  Right lateral flexion 15  Left lateral flexion 15  Right rotation 40  Left rotation 45   (Blank rows = not tested)  LOWER EXTREMITY ROM:     Active  Right eval Left eval  Hip flexion WFL   Hip extension    Hip abduction    Hip adduction    Hip internal rotation    Hip external rotation    Knee flexion    Knee extension    Ankle dorsiflexion    Ankle plantarflexion    Ankle inversion    Ankle eversion     (Blank rows = not tested)  LOWER EXTREMITY MMT:    MMT Right eval Left eval  Hip flexion 4- 4-  Hip extension 4 4  Hip abduction 4+ 4+  Hip adduction 5 5  Hip internal rotation    Hip external rotation    Knee flexion 5 5  Knee extension 5 5  Ankle dorsiflexion 5 5  Ankle plantarflexion    Ankle inversion    Ankle eversion     (Blank rows = not tested)  LUMBAR SPECIAL TESTS:  Prone instability test: Negative, Slump test: Negative, and SI Compression/distraction test: Negative  FUNCTIONAL TESTS:  5 times sit to stand: 12.9 hands pushing from thights  Timed up and go (TUG): 16.93 3 minute walk test: 643ft  10 meter walk test: 0.9 m/s  Berg Balance Scale: 46/56 Functional gait assessment: 22/30  GAIT: Distance walked:  649 Assistive device utilized: Single point cane Level of assistance: Modified independence Comments: decreased hip extension or pelvic rotation   TODAY'S TREATMENT:                                                                                                                              DATE: 01/28/2023  Nustep level 2, 4 min.   Standing soleus stretch 2 x 30sec.  Tandem stance 2 x 10 sec bil with intermittent UE support   Semitandem stance 2 x 10 sec with no UE support  Reciprocal foot tap on 5inch step with BUE support x 10 BLE then performed x 5 BLE without UE support.  SLS 10 sec hold x 2 bil with BUE support.  Seated single knee to chest stretch x 10 sec hold with mild irritation in the low back.  Figure 4 glute strethc x 20 sec without LBP Hip abduction with BUE support x 10 bil. Instruction to recude trunkal sway on the RLE to improve activation of gluteals over low back.     PATIENT EDUCATION:  Education details: POC. Pt  educated throughout session about proper posture and technique with exercises. Improved exercise technique, movement at target joints, use of target muscles after min to mod verbal, visual, tactile cues.  Person educated: Patient Education method: Medical illustrator Education comprehension: verbalized understanding  HOME EXERCISE PROGRAM: Access Code: [email or web address] URL: [email or web address] Date: 01/28/2023 Prepared by: Grier Rocher  Exercises - Standing Tandem Balance with Counter Support  - 1 x daily - 5 x weekly - 3 sets - 2 reps - 10 hold - Standing Single Leg Stance with Counter Support  - 1 x daily - 5 x weekly - 3 sets - 2 reps - 10 hold - Standing Hip Abduction with Counter Support  - 1 x daily - 5 x weekly - 2 sets - 10 reps - 2 hold  ASSESSMENT:  CLINICAL IMPRESSION: Patient presents to PT treatment with good effort throughout session, but reports feeling tired and sore today fro mlow back ROM therex since last PT treatment. PT treatment focused on dynamic balance and core stability on this day with single limb and tandem stance. No significant increased in pain following therex on this day reported by pt.  Pt will benefit from skilled PT to improved balance, strength, spinal ROM, and tolerance to standing as well as reduce Low back pain and improve QoL.    OBJECTIVE IMPAIRMENTS: decreased balance, decreased endurance, decreased mobility,  difficulty walking, decreased ROM, decreased strength, hypomobility, impaired perceived functional ability, impaired sensation, improper body mechanics, and postural dysfunction.   ACTIVITY LIMITATIONS: carrying, lifting, standing, squatting, stairs, and locomotion level  PARTICIPATION LIMITATIONS: laundry, shopping, community activity, and yard work  PERSONAL FACTORS: Age and 3+ comorbidities: CA, hypertension, CKD III  are also affecting patient's functional outcome.   REHAB POTENTIAL: Good  CLINICAL DECISION MAKING: Evolving/moderate complexity  EVALUATION COMPLEXITY: Moderate   GOALS: Goals reviewed with patient? No   SHORT TERM GOALS: Target date: 6/120/2024    Patient will be independent in home exercise program to improve strength/mobility for better functional independence with ADLs. Baseline: initiated on 01/20/2023 Goal status: INITIAL   LONG TERM GOALS: Target date: 04/15/2023    Patient will increase FOTO score to equal to or greater than 60   to demonstrate statistically significant improvement in mobility and quality of life.  Baseline: 55 Goal status: INITIAL  2.  Patient (> 19 years old) will complete five times sit to stand test in < 11 seconds indicating an increased LE strength and improved balance. Baseline: 12.9 Goal status: INITIAL  3.  Patient will increase Berg Balance score by > 6 points to demonstrate decreased fall risk during functional activities Baseline: 43 Goal status: INITIAL  4.  Patient will increase 10 meter walk test to >1.74m/s as to improve gait speed for better community ambulation and to reduce fall risk. Baseline: 0.9 Goal status: INITIAL  5.  Patient will reduce timed up and go to <11 seconds to reduce fall risk and demonstrate improved transfer/gait ability. Baseline: 16.9sec Goal status: INITIAL  6.  Patient will increase FGA >4points as to demonstrate reduced fall risk and improved dynamic gait balance for better safety with  community/home ambulation.   Baseline: 22 Goal status: INITIAL    PLAN:  PT FREQUENCY: 1-2x/week  PT DURATION: 12 weeks  PLANNED INTERVENTIONS: Therapeutic exercises, Therapeutic activity, Neuromuscular re-education, Balance training, Gait training, Patient/Family education, Self Care, Joint mobilization, Joint manipulation, Stair training, DME instructions, Dry Needling, Spinal mobilization, Moist heat, scar mobilization, and Manual therapy.  PLAN FOR NEXT SESSION:  Manual therapy for pain modulation.  Gentle therex of low/mid back stabilization.  Balance and BLE strength training. Reassess HEP and adjust as needed.     Grier Rocher PT, DPT  Physical Therapist - [facility]  11:25 AM 01/28/23

## 2023-01-31 DIAGNOSIS — J9601 Acute respiratory failure with hypoxia: Secondary | ICD-10-CM | POA: Diagnosis not present

## 2023-01-31 DIAGNOSIS — M6281 Muscle weakness (generalized): Secondary | ICD-10-CM | POA: Diagnosis not present

## 2023-01-31 DIAGNOSIS — R2689 Other abnormalities of gait and mobility: Secondary | ICD-10-CM | POA: Diagnosis not present

## 2023-01-31 DIAGNOSIS — C9000 Multiple myeloma not having achieved remission: Secondary | ICD-10-CM | POA: Diagnosis not present

## 2023-01-31 DIAGNOSIS — S32591D Other specified fracture of right pubis, subsequent encounter for fracture with routine healing: Secondary | ICD-10-CM | POA: Diagnosis not present

## 2023-02-01 ENCOUNTER — Other Ambulatory Visit: Payer: Self-pay | Admitting: *Deleted

## 2023-02-01 ENCOUNTER — Ambulatory Visit: Payer: PPO | Admitting: Physical Therapy

## 2023-02-01 ENCOUNTER — Other Ambulatory Visit: Payer: Self-pay

## 2023-02-01 DIAGNOSIS — R262 Difficulty in walking, not elsewhere classified: Secondary | ICD-10-CM

## 2023-02-01 DIAGNOSIS — M6281 Muscle weakness (generalized): Secondary | ICD-10-CM

## 2023-02-01 DIAGNOSIS — C9000 Multiple myeloma not having achieved remission: Secondary | ICD-10-CM

## 2023-02-01 DIAGNOSIS — M5459 Other low back pain: Secondary | ICD-10-CM

## 2023-02-01 MED ORDER — LENALIDOMIDE 20 MG PO CAPS
20.0000 mg | ORAL_CAPSULE | Freq: Every day | ORAL | 0 refills | Status: DC
Start: 2023-02-01 — End: 2023-03-01

## 2023-02-01 NOTE — Therapy (Unsigned)
OUTPATIENT PHYSICAL THERAPY THORACOLUMBAR EVALUATION   Patient Name: Jack [name] MRN: [id number] DOB:Dec 24, 1932, 89 y.o., male Today's Date: 01/28/2023  END OF SESSION:  PT End of Session - 02/01/23 1610     Visit Number 4    Number of Visits 24    Date for PT Re-Evaluation 04/14/23    PT Start Time 1601    PT Stop Time 1645    PT Time Calculation (min) 44 min    Activity Tolerance Patient tolerated treatment well    Behavior During Therapy [facility] for tasks assessed/performed             Past Medical History:  Diagnosis Date   Ascending aortic aneurysm (HCC)    a. 12/2020 Echo: Asc Ao 48mm, Ao root 40mm. b. 03/2022 Asc Ao 4.6 cm (4.5 cm in 2019)   CKD (chronic kidney disease), stage III (HCC)    Diastolic dysfunction    a. 12/2020 Echo: EF 50-55%, no rwma, mild LVH, GrI DD, nl RV fxn, mild AI. Asc Ao 48mm, Ao root 40mm.   Elevated prostate specific antigen (PSA)    has been 7 for a year    GERD (gastroesophageal reflux disease)    History of colon polyps 2008   [facility],    History of kidney stones    Hyperlipidemia    Hypertension    Myeloma (HCC)    PAF (paroxysmal atrial fibrillation) (HCC)    a. 01/2021-->Eliquis (CHA2DS2VASc = 3-4).   Pain    Prostate hypertrophy    diagnosed at age 28 due to hematospermia   Past Surgical History:  Procedure Laterality Date   CATARACT EXTRACTION W/PHACO Left 01/10/2018   Procedure: CATARACT EXTRACTION PHACO AND INTRAOCULAR LENS PLACEMENT (IOC);  Surgeon: Galen Manila, MD;  Location: [facility] ORS;  Service: Ophthalmology;  Laterality: Left;  [name] 00:24.8 AP% 14.9 CDE 3.68 Fluid pack lot # [id number]   CATARACT EXTRACTION W/PHACO Right 01/25/2018   Procedure: CATARACT EXTRACTION PHACO AND INTRAOCULAR LENS PLACEMENT (IOC);  Surgeon: Galen Manila, MD;  Location: [facility] OR[facility];  Service: Ophthalmology;  Laterality: Right;  [name] 00:42 AP% 10.8 CDE 4.59 Fluid pack lot # [id number]   COLON SURGERY     CYSTOSCOPY W/ URETERAL STENT  PLACEMENT Right 10/16/2017   Procedure: right  URETERAL STENT PLACEMENT,cystoscopy bilateral stent removal,rretrograde;  Surgeon: Riki Altes, MD;  Location: [facility] ORS;  Service: Urology;  Laterality: Right;   CYSTOSCOPY/URETEROSCOPY/HOLMIUM LASER/STENT PLACEMENT Right 12/16/2020   Procedure: CYSTOSCOPY/URETEROSCOPY/HOLMIUM LASER/STENT PLACEMENT;  Surgeon: Riki Altes, MD;  Location: [facility] OR[facility];  Service: Urology;  Laterality: Right;   EXTRACORPOREAL SHOCK WAVE LITHOTRIPSY Right 12/11/2020   Procedure: EXTRACORPOREAL SHOCK WAVE LITHOTRIPSY (ESWL);  Surgeon: Riki Altes, MD;  Location: [facility] ORS;  Service: Urology;  Laterality: Right;   IR BONE TUMOR(S)RF ABLATION  04/16/2022   IR KYPHO EA ADDL LEVEL THORACIC OR LUMBAR  02/02/2021   IR KYPHO LUMBAR INC FX REDUCE BONE BX UNI/BIL CANNULATION INC/IMAGING  02/02/2021   IR KYPHO THORACIC WITH BONE BIOPSY  04/15/2022   KIDNEY STONE SURGERY     KYPHOPLASTY N/A 03/12/2021   Procedure: Nicki Reaper, L3;  Surgeon: Kennedy Bucker, MD;  Location: [facility] OR[facility];  Service: Orthopedics;  Laterality: N/A;   RESECTION SOFT TISSUE TUMOR LEG / ANKLE RADICAL  jan 2009   [facility],  right thigh/knee , nonmalignant   SMALL INTESTINE SURGERY  1946   implaed on picket fence, punctured stomach   TONSILLECTOMY     Patient Active Problem List  Diagnosis Date Noted   Aneurysm of ascending aorta without rupture (HCC) 08/26/2022   Statin myopathy 08/26/2022   Postural dizziness with presyncope 06/11/2022   Pelvic fracture (HCC) 06/10/2022   Multiple myeloma (HCC) 05/05/2021   Compression fracture of body of thoracic vertebra (HCC)    Acute hypoxemic respiratory failure (HCC) 03/26/2021   Right flank pain 03/26/2021   Multiple pathological fractures 03/14/2021   Kappa light chain myeloma (HCC)    Back pain 03/11/2021   Atrial fibrillation (HCC) 03/11/2021   Compression fracture of L2 and L3 03/11/2021   Hyperlipidemia LDL goal <70    Constipation    Palliative care  encounter    Actinic keratosis 02/19/2021   Inguinal hernia 02/19/2021   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   Numbness of foot 02/19/2021   Sciatica 02/19/2021   Shoulder pain 02/19/2021   Palliative care patient 02/17/2021   Closed compression fracture of body of L1 vertebra (HCC) 02/07/2021   Protein-calorie malnutrition, severe 01/31/2021   Unsteadiness on feet 01/20/2021   Chronic kidney disease, stage 3b (HCC) 01/07/2021   Collapsed vertebra, not elsewhere classified, thoracic region, subsequent encounter for fracture with routine healing 01/07/2021   Muscle weakness (generalized) 01/07/2021   Prostate hyperplasia, benign localized, without urinary obstruction 01/07/2021   Nonrheumatic mitral (valve) insufficiency 01/07/2021   Hypokalemia    Normocytic anemia    Atherosclerosis of aorta (HCC)    Irregular heart rhythm    T12 compression fracture (HCC) 01/02/2021   History of kidney stones    Acute kidney injury superimposed on CKD (HCC)    Ureteral stone    Ureteral perforation secondary to stent manipulation 10/16/2017   Abdominal pain 10/16/2017   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/26/2016   Hyperplasia, prostate 02/03/2015   Multinodular goiter 07/03/2014   Thyroid nodule 06/20/2014   Nontoxic uninodular goiter 06/20/2014   Edema 09/24/2013   Encounter for preventive health examination 08/21/2013   Personal history of colonic polyps 08/21/2013   History of venomous snake bite 08/21/2013   Cough 10/02/2012   Prostate hypertrophy    Elevated prostate specific antigen (PSA)    HYPERTRIGLYCERIDEMIA 12/21/2006   Essential hypertension 12/21/2006   GERD 12/21/2006   CALCULUS, KIDNEY 12/21/2006    PCP: Dorothey Baseman, MD   REFERRING PROVIDER: Borders, Daryl Eastern, NP   REFERRING DIAG: C90.00 (ICD-10-CM) - Kappa light chain myeloma (HCC)    Rationale for Evaluation and Treatment: Rehabilitation  THERAPY DIAG:  Muscle weakness  (generalized)  Other low back pain  Difficulty in walking, not elsewhere classified  ONSET DATE: >6 months  SUBJECTIVE:  SUBJECTIVE STATEMENT: Pt reports that he is doing better. Only mild pain noted in the Lateral mid back 3-4/10. States that he was able to sleep better last night, with less     PERTINENT HISTORY:  89 y.o. male with multiple medical problems including hypertension, CKD stage IIIb, A. fib on Eliquis, and anemia.  Patient was hospitalized 01/02/2021 to 01/07/2021 with T12 compression fracture with recommendation for conservative management including use of a TLSO brace.  Patient was hospitalized again 01/29/2021 to 02/07/2021 with recurrent back pain and MR showing T12 and new L1 compression fracture.  Patient underwent T12/L1 kyphoplasty on 02/02/2021.  There was also suspicion of a left fifth rib fracture, which occurred while transitioning from the stretcher for his kyphoplasty.  Patient had mild hypercalcemia with SPEP and UPEP concerning for possible myeloma.  Patient was referred to [facility] for work-up.  He was readmitted 03/11/2021 - 03/17/2021 with worsening back pain.  CT revealed new compression fractures of L2 and L3.  Patient underwent kyphoplasty on 6/30.  He was hospitalized again 03/26/2021-04/07/2021 with severe back/flank pain.  MRI revealed T10 pathologic compression fracture.  He was started on treatment for multiple myeloma with dexamethasone/Velcade and XRT to spine.   PAIN:  Are you having pain? Yes: NPRS scale: 4/10 Pain location:  mid to lower back R >L   Pain description: "pain" Aggravating factors: getting out of bed Relieving factors: Heat, muscle rub,     PRECAUTIONS: Back for comfort   WEIGHT BEARING RESTRICTIONS: No  FALLS:  Has patient fallen in last 6  months? Yes. [place]   LIVING ENVIRONMENT: Lives with: lives with their spouse Lives in: House/apartment Stairs: No Has following equipment at home: Single point cane  OCCUPATION: retired.   PLOF: Independent, Independent with basic ADLs, and Independent with community mobility without device  Pt reports only recent use of SPC   over the past month   PATIENT GOALS: reduce back pain and improve balance.   NEXT MD VISIT: June 2024  OBJECTIVE:   DIAGNOSTIC FINDINGS:   [id number]/72024: New MRI Pending     CLINICAL DATA:  Multiple myeloma. EXAM: NUCLEAR MEDICINE WHOLE BODY BONE SCAN IMPRESSION: No evident malignant lesion by MDP whole-body bone imaging.    CLINICAL DATA:  Mid to low back pain. History of multiple myeloma. Prior kyphoplasty. EXAM: MRI THORACIC AND LUMBAR SPINE WITHOUT CONTRAST IMPRESSION: 1. Remote compression fractures of T11, T12, L1, L2 and L3, status post vertebral augmentation. No acute abnormality of the thoracic or lumbar spine. 2. Previously described enhancing lesions at T8 and T9 are poorly characterized without contrast. There unenhanced appearance is unchanged. 3. No spinal canal or neural foraminal stenosis.    PATIENT SURVEYS:  FOTO 55  SCREENING FOR RED FLAGS: Bowel or bladder incontinence: No Spinal tumors: No Cauda equina syndrome: No Compression fracture: Yes: multiple kyphoplasty  Abdominal aneurysm: No  COGNITION: Overall cognitive status: Within functional limits for tasks assessed     SENSATION: Light touch: Impaired  and baseline neuropathy in distal BLE to mid shin  in BLE   MUSCLE LENGTH: Hamstrings: Right  deg; Left  deg Maisie Fus test: Right  deg; Left  deg bas POSTURE: rounded shoulders, forward head, and decreased lumbar lordosis  PALPATION: Sensitive to palpation on the lower R side of the back and R SI joint. Noted to have multiple trigger point to paraspinals R>L and R side QL.   LUMBAR ROM:    AROM eval  Flexion 10  Extension 10  Right lateral flexion 15  Left lateral flexion 15  Right rotation 40  Left rotation 45   (Blank rows = not tested)  LOWER EXTREMITY ROM:     Active  Right eval Left eval  Hip flexion WFL   Hip extension    Hip abduction    Hip adduction    Hip internal rotation    Hip external rotation    Knee flexion    Knee extension    Ankle dorsiflexion    Ankle plantarflexion    Ankle inversion    Ankle eversion     (Blank rows = not tested)  LOWER EXTREMITY MMT:    MMT Right eval Left eval  Hip flexion 4- 4-  Hip extension 4 4  Hip abduction 4+ 4+  Hip adduction 5 5  Hip internal rotation    Hip external rotation    Knee flexion 5 5  Knee extension 5 5  Ankle dorsiflexion 5 5  Ankle plantarflexion    Ankle inversion    Ankle eversion     (Blank rows = not tested)  LUMBAR SPECIAL TESTS:  Prone instability test: Negative, Slump test: Negative, and SI Compression/distraction test: Negative  FUNCTIONAL TESTS:  5 times sit to stand: 12.9 hands pushing from thights  Timed up and go (TUG): 16.93 3 minute walk test: 613ft  10 meter walk test: 0.9 m/s  Berg Balance Scale: 46/56 Functional gait assessment: 22/30  GAIT: Distance walked:  649 Assistive device utilized: Single point cane Level of assistance: Modified independence Comments: decreased hip extension or pelvic rotation   TODAY'S TREATMENT:                                                                                                                              DATE: 01/28/2023  Nustep level 2, 4 min. BUE/BLE cues to remain in pain free range and reduce rotation of lower trunk.   Standing with 1 LE on 12inch step for modified knees to chest.  2 x 30 sec bil.  Standing hip flexor/gastroc stretch 2 x 30 sec bil  3 way hip with RTB x 10 each direction performed for  BLE.   Pt required restroom break for urination.   Dynamic core stabilization through balance  training  Tandem stance 3x 30 sec.   Foot tap on hedge hog 2x 10 bil.  Foot tap on 2 hedge hogs 2 x 5 bil.  Cues for deep core activation and improved posture throughout as well as use of ankle stratetgy to correct lateral LOB. Intermittent UE support to prevent LOB with each exercise.    PATIENT EDUCATION:  Education details: POC. Pt educated throughout session about proper posture and technique with exercises. Improved exercise technique, movement at target joints, use of target muscles after min to mod verbal, visual, tactile cues.  Person educated: Patient Education method: Medical illustrator Education comprehension: verbalized understanding  HOME  EXERCISE PROGRAM: Access Code: [email or web address] URL: [email or web address] Date: 01/28/2023 Prepared by: Grier Rocher  Exercises - Standing Tandem Balance with Counter Support  - 1 x daily - 5 x weekly - 3 sets - 2 reps - 10 hold - Standing Single Leg Stance with Counter Support  - 1 x daily - 5 x weekly - 3 sets - 2 reps - 10 hold - Standing Hip Abduction with Counter Support  - 1 x daily - 5 x weekly - 2 sets - 10 reps - 2 hold  ASSESSMENT:  CLINICAL IMPRESSION: Patient presents to PT treatment with good effort throughout session, reports reduced pain at start of PT treatment on this day. Limited today by need for use of restroom in session. Session focused on dynamic balance training with emphasis on core activation in dynamic movements.  No significant increased in pain following therex on this day reported by pt.  Pt will benefit from skilled PT to improved balance, strength, spinal ROM, and tolerance to standing as well as reduce Low back pain and improve QoL.    OBJECTIVE IMPAIRMENTS: decreased balance, decreased endurance, decreased mobility, difficulty walking, decreased ROM, decreased strength, hypomobility, impaired perceived functional ability, impaired sensation, improper body mechanics, and postural  dysfunction.   ACTIVITY LIMITATIONS: carrying, lifting, standing, squatting, stairs, and locomotion level  PARTICIPATION LIMITATIONS: laundry, shopping, community activity, and yard work  PERSONAL FACTORS: Age and 3+ comorbidities: CA, hypertension, CKD III  are also affecting patient's functional outcome.   REHAB POTENTIAL: Good  CLINICAL DECISION MAKING: Evolving/moderate complexity  EVALUATION COMPLEXITY: Moderate   GOALS: Goals reviewed with patient? No   SHORT TERM GOALS: Target date: 6/120/2024    Patient will be independent in home exercise program to improve strength/mobility for better functional independence with ADLs. Baseline: initiated on 01/20/2023 Goal status: INITIAL   LONG TERM GOALS: Target date: 04/15/2023    Patient will increase FOTO score to equal to or greater than 60   to demonstrate statistically significant improvement in mobility and quality of life.  Baseline: 55 Goal status: INITIAL  2.  Patient (> 38 years old) will complete five times sit to stand test in < 11 seconds indicating an increased LE strength and improved balance. Baseline: 12.9 Goal status: INITIAL  3.  Patient will increase Berg Balance score by > 6 points to demonstrate decreased fall risk during functional activities Baseline: 43 Goal status: INITIAL  4.  Patient will increase 10 meter walk test to >1.28m/s as to improve gait speed for better community ambulation and to reduce fall risk. Baseline: 0.9 Goal status: INITIAL  5.  Patient will reduce timed up and go to <11 seconds to reduce fall risk and demonstrate improved transfer/gait ability. Baseline: 16.9sec Goal status: INITIAL  6.  Patient will increase FGA >4points as to demonstrate reduced fall risk and improved dynamic gait balance for better safety with community/home ambulation.   Baseline: 22 Goal status: INITIAL    PLAN:  PT FREQUENCY: 1-2x/week  PT DURATION: 12 weeks  PLANNED INTERVENTIONS:  Therapeutic exercises, Therapeutic activity, Neuromuscular re-education, Balance training, Gait training, Patient/Family education, Self Care, Joint mobilization, Joint manipulation, Stair training, DME instructions, Dry Needling, Spinal mobilization, Moist heat, scar mobilization, and Manual therapy.  PLAN FOR NEXT SESSION:  Manual therapy for pain modulation.  Gentle therex of low/mid back stabilization.  Balance and BLE strength training.  Reassess HEP and adjust as needed.     Grier Rocher PT, DPT  Physical Therapist - [facility]  11:25 AM 01/28/23

## 2023-02-03 ENCOUNTER — Ambulatory Visit: Payer: PPO | Admitting: Physical Therapy

## 2023-02-03 DIAGNOSIS — M6281 Muscle weakness (generalized): Secondary | ICD-10-CM

## 2023-02-03 DIAGNOSIS — R262 Difficulty in walking, not elsewhere classified: Secondary | ICD-10-CM

## 2023-02-03 DIAGNOSIS — M5459 Other low back pain: Secondary | ICD-10-CM | POA: Diagnosis not present

## 2023-02-03 NOTE — Therapy (Signed)
OUTPATIENT PHYSICAL THERAPY THORACOLUMBAR TREATMENT   Patient Name: Jack [name] MRN: [id number] DOB:1933-05-29, 89 y.o., male Today's Date: 02/03/2023  END OF SESSION:  PT End of Session - 02/03/23 1602     Visit Number 5    Number of Visits 24    Date for PT Re-Evaluation 04/14/23    PT Start Time 1601    PT Stop Time 1645    PT Time Calculation (min) 44 min    Activity Tolerance Patient tolerated treatment well    Behavior During Therapy [facility] for tasks assessed/performed             Past Medical History:  Diagnosis Date   Ascending aortic aneurysm (HCC)    a. 12/2020 Echo: Asc Ao 48mm, Ao root 40mm. b. 03/2022 Asc Ao 4.6 cm (4.5 cm in 2019)   CKD (chronic kidney disease), stage III (HCC)    Diastolic dysfunction    a. 12/2020 Echo: EF 50-55%, no rwma, mild LVH, GrI DD, nl RV fxn, mild AI. Asc Ao 48mm, Ao root 40mm.   Elevated prostate specific antigen (PSA)    has been 7 for a year    GERD (gastroesophageal reflux disease)    History of colon polyps 2008   [facility],    History of kidney stones    Hyperlipidemia    Hypertension    Myeloma (HCC)    PAF (paroxysmal atrial fibrillation) (HCC)    a. 01/2021-->Eliquis (CHA2DS2VASc = 3-4).   Pain    Prostate hypertrophy    diagnosed at age 89 due to hematospermia   Past Surgical History:  Procedure Laterality Date   CATARACT EXTRACTION W/PHACO Left 01/10/2018   Procedure: CATARACT EXTRACTION PHACO AND INTRAOCULAR LENS PLACEMENT (IOC);  Surgeon: Galen Manila, MD;  Location: [facility] ORS;  Service: Ophthalmology;  Laterality: Left;  [name] 00:24.8 AP% 14.9 CDE 3.68 Fluid pack lot # [id number]   CATARACT EXTRACTION W/PHACO Right 01/25/2018   Procedure: CATARACT EXTRACTION PHACO AND INTRAOCULAR LENS PLACEMENT (IOC);  Surgeon: Galen Manila, MD;  Location: [facility] OR[facility];  Service: Ophthalmology;  Laterality: Right;  [name] 00:42 AP% 10.8 CDE 4.59 Fluid pack lot # [id number]   COLON SURGERY     CYSTOSCOPY W/ URETERAL STENT  PLACEMENT Right 10/16/2017   Procedure: right  URETERAL STENT PLACEMENT,cystoscopy bilateral stent removal,rretrograde;  Surgeon: Riki Altes, MD;  Location: [facility] OR[facility];  Service: Urology;  Laterality: Right;   CYSTOSCOPY/URETEROSCOPY/HOLMIUM LASER/STENT PLACEMENT Right 12/16/2020   Procedure: CYSTOSCOPY/URETEROSCOPY/HOLMIUM LASER/STENT PLACEMENT;  Surgeon: Riki Altes, MD;  Location: [facility];  Service: Urology;  Laterality: Right;   EXTRACORPOREAL SHOCK WAVE LITHOTRIPSY Right 12/11/2020   Procedure: EXTRACORPOREAL SHOCK WAVE LITHOTRIPSY (ESWL);  Surgeon: Riki Altes, MD;  Location: [facility] OR[facility];  Service: Urology;  Laterality: Right;   IR BONE TUMOR(S)RF ABLATION  04/16/2022   IR KYPHO EA ADDL LEVEL THORACIC OR LUMBAR  02/02/2021   IR KYPHO LUMBAR INC FX REDUCE BONE BX UNI/BIL CANNULATION INC/IMAGING  02/02/2021   IR KYPHO THORACIC WITH BONE BIOPSY  04/15/2022   KIDNEY STONE SURGERY     KYPHOPLASTY N/A 03/12/2021   Procedure: Nicki Reaper, L3;  Surgeon: Kennedy Bucker, MD;  Location: [facility] OR[facility];  Service: Orthopedics;  Laterality: N/A;   RESECTION SOFT TISSUE TUMOR LEG / ANKLE RADICAL  jan 2009   [facility],  right thigh/knee , nonmalignant   SMALL INTESTINE SURGERY  1946   implaed on picket fence, punctured stomach   TONSILLECTOMY     Patient Active Problem List  Diagnosis Date Noted   Aneurysm of ascending aorta without rupture (HCC) 08/26/2022   Statin myopathy 08/26/2022   Postural dizziness with presyncope 06/11/2022   Pelvic fracture (HCC) 06/10/2022   Multiple myeloma (HCC) 05/05/2021   Compression fracture of body of thoracic vertebra (HCC)    Acute hypoxemic respiratory failure (HCC) 03/26/2021   Right flank pain 03/26/2021   Multiple pathological fractures 03/14/2021   Kappa light chain myeloma (HCC)    Back pain 03/11/2021   Atrial fibrillation (HCC) 03/11/2021   Compression fracture of L2 and L3 03/11/2021   Hyperlipidemia LDL goal <70    Constipation    Palliative care  encounter    Actinic keratosis 02/19/2021   Inguinal hernia 02/19/2021   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   Numbness of foot 02/19/2021   Sciatica 02/19/2021   Shoulder pain 02/19/2021   Palliative care patient 02/17/2021   Closed compression fracture of body of L1 vertebra (HCC) 02/07/2021   Protein-calorie malnutrition, severe 01/31/2021   Unsteadiness on feet 01/20/2021   Chronic kidney disease, stage 3b (HCC) 01/07/2021   Collapsed vertebra, not elsewhere classified, thoracic region, subsequent encounter for fracture with routine healing 01/07/2021   Muscle weakness (generalized) 01/07/2021   Prostate hyperplasia, benign localized, without urinary obstruction 01/07/2021   Nonrheumatic mitral (valve) insufficiency 01/07/2021   Hypokalemia    Normocytic anemia    Atherosclerosis of aorta (HCC)    Irregular heart rhythm    T12 compression fracture (HCC) 01/02/2021   History of kidney stones    Acute kidney injury superimposed on CKD (HCC)    Ureteral stone    Ureteral perforation secondary to stent manipulation 10/16/2017   Abdominal pain 10/16/2017   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/26/2016   Hyperplasia, prostate 02/03/2015   Multinodular goiter 07/03/2014   Thyroid nodule 06/20/2014   Nontoxic uninodular goiter 06/20/2014   Edema 09/24/2013   Encounter for preventive health examination 08/21/2013   Personal history of colonic polyps 08/21/2013   History of venomous snake bite 08/21/2013   Cough 10/02/2012   Prostate hypertrophy    Elevated prostate specific antigen (PSA)    HYPERTRIGLYCERIDEMIA 12/21/2006   Essential hypertension 12/21/2006   GERD 12/21/2006   CALCULUS, KIDNEY 12/21/2006    PCP: Dorothey Baseman, MD   REFERRING PROVIDER: Borders, Daryl Eastern, NP   REFERRING DIAG: C90.00 (ICD-10-CM) - Kappa light chain myeloma (HCC)    Rationale for Evaluation and Treatment: Rehabilitation  THERAPY DIAG:  Muscle weakness  (generalized)  Difficulty in walking, not elsewhere classified  Other low back pain  ONSET DATE: >6 months  SUBJECTIVE:  SUBJECTIVE STATEMENT: Pt reports that he is doing better. Only mild pain noted in the Lateral mid back 2-3/10. PT also reports that his wife has said that she thinks he has been walking better at home over the last couple days.     PERTINENT HISTORY:  89 y.o. male with multiple medical problems including hypertension, CKD stage IIIb, A. fib on Eliquis, and anemia.  Patient was hospitalized 01/02/2021 to 01/07/2021 with T12 compression fracture with recommendation for conservative management including use of a TLSO brace.  Patient was hospitalized again 01/29/2021 to 02/07/2021 with recurrent back pain and MR showing T12 and new L1 compression fracture.  Patient underwent T12/L1 kyphoplasty on 02/02/2021.  There was also suspicion of a left fifth rib fracture, which occurred while transitioning from the stretcher for his kyphoplasty.  Patient had mild hypercalcemia with SPEP and UPEP concerning for possible myeloma.  Patient was referred to [facility] for work-up.  He was readmitted 03/11/2021 - 03/17/2021 with worsening back pain.  CT revealed new compression fractures of L2 and L3.  Patient underwent kyphoplasty on 6/30.  He was hospitalized again 03/26/2021-04/07/2021 with severe back/flank pain.  MRI revealed T10 pathologic compression fracture.  He was started on treatment for multiple myeloma with dexamethasone/Velcade and XRT to spine.   PAIN:  Are you having pain? Yes: NPRS scale: 2-3/10 Pain location:  mid to lower back R >L   Pain description: "pain" Aggravating factors: getting out of bed Relieving factors: Heat, muscle rub,     PRECAUTIONS: Back for comfort   WEIGHT BEARING  RESTRICTIONS: No  FALLS:  Has patient fallen in last 6 months? Yes. [place]   LIVING ENVIRONMENT: Lives with: lives with their spouse Lives in: House/apartment Stairs: No Has following equipment at home: Single point cane  OCCUPATION: retired.   PLOF: Independent, Independent with basic ADLs, and Independent with community mobility without device  Pt reports only recent use of SPC   over the past month   PATIENT GOALS: reduce back pain and improve balance.   NEXT MD VISIT: June 2024  OBJECTIVE:   DIAGNOSTIC FINDINGS:   5/72024: New MRI Pending     CLINICAL DATA:  Multiple myeloma. EXAM: NUCLEAR MEDICINE WHOLE BODY BONE SCAN IMPRESSION: No evident malignant lesion by MDP whole-body bone imaging.    CLINICAL DATA:  Mid to low back pain. History of multiple myeloma. Prior kyphoplasty. EXAM: MRI THORACIC AND LUMBAR SPINE WITHOUT CONTRAST IMPRESSION: 1. Remote compression fractures of T11, T12, L1, L2 and L3, status post vertebral augmentation. No acute abnormality of the thoracic or lumbar spine. 2. Previously described enhancing lesions at T8 and T9 are poorly characterized without contrast. There unenhanced appearance is unchanged. 3. No spinal canal or neural foraminal stenosis.    PATIENT SURVEYS:  FOTO 55  SCREENING FOR RED FLAGS: Bowel or bladder incontinence: No Spinal tumors: No Cauda equina syndrome: No Compression fracture: Yes: multiple kyphoplasty  Abdominal aneurysm: No  COGNITION: Overall cognitive status: Within functional limits for tasks assessed     SENSATION: Light touch: Impaired  and baseline neuropathy in distal BLE to mid shin  in BLE   MUSCLE LENGTH: Hamstrings: Right  deg; Left  deg Maisie Fus test: Right  deg; Left  deg bas POSTURE: rounded shoulders, forward head, and decreased lumbar lordosis  PALPATION: Sensitive to palpation on the lower R side of the back and R SI joint. Noted to have multiple  trigger point to paraspinals R>L and  R side QL.   LUMBAR ROM:   AROM eval  Flexion 10  Extension 10  Right lateral flexion 15  Left lateral flexion 15  Right rotation 40  Left rotation 45   (Blank rows = not tested)  LOWER EXTREMITY ROM:     Active  Right eval Left eval  Hip flexion WFL   Hip extension    Hip abduction    Hip adduction    Hip internal rotation    Hip external rotation    Knee flexion    Knee extension    Ankle dorsiflexion    Ankle plantarflexion    Ankle inversion    Ankle eversion     (Blank rows = not tested)  LOWER EXTREMITY MMT:    MMT Right eval Left eval  Hip flexion 4- 4-  Hip extension 4 4  Hip abduction 4+ 4+  Hip adduction 5 5  Hip internal rotation    Hip external rotation    Knee flexion 5 5  Knee extension 5 5  Ankle dorsiflexion 5 5  Ankle plantarflexion    Ankle inversion    Ankle eversion     (Blank rows = not tested)  LUMBAR SPECIAL TESTS:  Prone instability test: Negative, Slump test: Negative, and SI Compression/distraction test: Negative  FUNCTIONAL TESTS:  5 times sit to stand: 12.9 hands pushing from thights  Timed up and go (TUG): 16.93 3 minute walk test: 632ft  10 meter walk test: 0.9 m/s  Berg Balance Scale: 46/56 Functional gait assessment: 22/30  GAIT: Distance walked:  649 Assistive device utilized: Single point cane Level of assistance: Modified independence Comments: decreased hip extension or pelvic rotation   TODAY'S TREATMENT:                                                                                                                              DATE: 02/03/2023   Seated HS stretch 2 x 30 sec bil  Standing hip flexor/heel cord stretch 2 x 40 sec  Seated single knee to chest. 2 x 30 sec   Seated therex: LAQ 2.5#ankle weights, x 12 with 3 sec hold Hip abduction RTB x 12 LE push down RTB x 12 bil   The Blaze Pod Random setting was chosen to enhance cognitive processing and agility,  providing an unpredictable environment to simulate real-world scenarios, and fostering quick reactions and adaptability.  Activity Description: pods elevated on 5inch step. 1 LE for 3 pods, and BLE for 4 pods.  Activity Setting:  random Number of Pods:  3-4 Cycles/Sets:  3 Duration (Time or Hit Count):  45 sec   Patient Stats  Reaction Time:   OR Hits:  3 pod: 23 LLE, 34 RLE.  4 pods: 27. 28 Min assist from PT for safety to reduce lateral LOB as well as cues for activation of deep core.   Nustep level 5 x 2 min level 3 x 2 min. Therapeutic  rest break x 2 min. Additional 2 min level 3.   No increased pain in back at end of PT treatment.    PATIENT EDUCATION:  Education details: POC. Pt educated throughout session about proper posture and technique with exercises. Improved exercise technique, movement at target joints, use of target muscles after min to mod verbal, visual, tactile cues.  Person educated: Patient Education method: Medical illustrator Education comprehension: verbalized understanding  HOME EXERCISE PROGRAM: Access Code: [email or web address] URL: [email or web address] Date: 01/28/2023 Prepared by: Grier Rocher  Exercises - Standing Tandem Balance with Counter Support  - 1 x daily - 5 x weekly - 3 sets - 2 reps - 10 hold - Standing Single Leg Stance with Counter Support  - 1 x daily - 5 x weekly - 3 sets - 2 reps - 10 hold - Standing Hip Abduction with Counter Support  - 1 x daily - 5 x weekly - 2 sets - 10 reps - 2 hold  ASSESSMENT:  CLINICAL IMPRESSION: Patient presents to PT treatment with good effort throughout session, reports reduced pain at start of PT treatment on this day. Pt tolerated gentle hip/BLE stretching and strengthening on this day as well as dynamic balance and core stability training. Intermittent UE support for SLS at rail in hall. No significant increased in pain following therex on this day reported by pt.  Pt will benefit from  skilled PT to improved balance, strength, spinal ROM, and tolerance to standing as well as reduce Low back pain and improve QoL.    OBJECTIVE IMPAIRMENTS: decreased balance, decreased endurance, decreased mobility, difficulty walking, decreased ROM, decreased strength, hypomobility, impaired perceived functional ability, impaired sensation, improper body mechanics, and postural dysfunction.   ACTIVITY LIMITATIONS: carrying, lifting, standing, squatting, stairs, and locomotion level  PARTICIPATION LIMITATIONS: laundry, shopping, community activity, and yard work  PERSONAL FACTORS: Age and 3+ comorbidities: CA, hypertension, CKD III  are also affecting patient's functional outcome.   REHAB POTENTIAL: Good  CLINICAL DECISION MAKING: Evolving/moderate complexity  EVALUATION COMPLEXITY: Moderate   GOALS: Goals reviewed with patient? No   SHORT TERM GOALS: Target date: 6/120/2024    Patient will be independent in home exercise program to improve strength/mobility for better functional independence with ADLs. Baseline: initiated on 01/20/2023 Goal status: INITIAL   LONG TERM GOALS: Target date: 04/15/2023    Patient will increase FOTO score to equal to or greater than 60   to demonstrate statistically significant improvement in mobility and quality of life.  Baseline: 55 Goal status: INITIAL  2.  Patient (> 42 years old) will complete five times sit to stand test in < 11 seconds indicating an increased LE strength and improved balance. Baseline: 12.9 Goal status: INITIAL  3.  Patient will increase Berg Balance score by > 6 points to demonstrate decreased fall risk during functional activities Baseline: 43 Goal status: INITIAL  4.  Patient will increase 10 meter walk test to >1.52m/s as to improve gait speed for better community ambulation and to reduce fall risk. Baseline: 0.9 Goal status: INITIAL  5.  Patient will reduce timed up and go to <11 seconds to reduce fall risk and  demonstrate improved transfer/gait ability. Baseline: 16.9sec Goal status: INITIAL  6.  Patient will increase FGA >4points as to demonstrate reduced fall risk and improved dynamic gait balance for better safety with community/home ambulation.   Baseline: 22 Goal status: INITIAL    PLAN:  PT FREQUENCY: 1-2x/week  PT DURATION: 12 weeks  PLANNED INTERVENTIONS: Therapeutic exercises,  Therapeutic activity, Neuromuscular re-education, Balance training, Gait training, Patient/Family education, Self Care, Joint mobilization, Joint manipulation, Stair training, DME instructions, Dry Needling, Spinal mobilization, Moist heat, scar mobilization, and Manual therapy.  PLAN FOR NEXT SESSION:   Balance and BLE strength training.  Improved hip mobility/stretching.  Reassess HEP and adjust as needed.   Manual therapy for pain modulation.  Gentle therex of low/mid back stabilization.   Grier Rocher PT, DPT  Physical Therapist - [place]  [facility]  5:19 PM 02/03/23

## 2023-02-04 DIAGNOSIS — C9000 Multiple myeloma not having achieved remission: Secondary | ICD-10-CM | POA: Diagnosis not present

## 2023-02-04 DIAGNOSIS — M6281 Muscle weakness (generalized): Secondary | ICD-10-CM | POA: Diagnosis not present

## 2023-02-04 DIAGNOSIS — J9601 Acute respiratory failure with hypoxia: Secondary | ICD-10-CM | POA: Diagnosis not present

## 2023-02-04 DIAGNOSIS — R2689 Other abnormalities of gait and mobility: Secondary | ICD-10-CM | POA: Diagnosis not present

## 2023-02-04 DIAGNOSIS — S32592D Other specified fracture of left pubis, subsequent encounter for fracture with routine healing: Secondary | ICD-10-CM | POA: Diagnosis not present

## 2023-02-08 ENCOUNTER — Inpatient Hospital Stay (HOSPITAL_BASED_OUTPATIENT_CLINIC_OR_DEPARTMENT_OTHER): Payer: PPO | Admitting: Oncology

## 2023-02-08 ENCOUNTER — Encounter: Payer: Self-pay | Admitting: Oncology

## 2023-02-08 ENCOUNTER — Ambulatory Visit: Payer: PPO | Admitting: Physical Therapy

## 2023-02-08 ENCOUNTER — Encounter: Payer: Self-pay | Admitting: Physical Therapy

## 2023-02-08 ENCOUNTER — Inpatient Hospital Stay: Payer: PPO | Admitting: Pharmacist

## 2023-02-08 ENCOUNTER — Inpatient Hospital Stay: Payer: PPO | Attending: Oncology

## 2023-02-08 ENCOUNTER — Inpatient Hospital Stay: Payer: PPO

## 2023-02-08 VITALS — BP 104/84 | HR 66 | Temp 98.2°F | Resp 18 | Ht 68.0 in | Wt 158.1 lb

## 2023-02-08 DIAGNOSIS — M5459 Other low back pain: Secondary | ICD-10-CM | POA: Diagnosis not present

## 2023-02-08 DIAGNOSIS — C9000 Multiple myeloma not having achieved remission: Secondary | ICD-10-CM

## 2023-02-08 DIAGNOSIS — D696 Thrombocytopenia, unspecified: Secondary | ICD-10-CM | POA: Diagnosis not present

## 2023-02-08 DIAGNOSIS — K089 Disorder of teeth and supporting structures, unspecified: Secondary | ICD-10-CM | POA: Insufficient documentation

## 2023-02-08 DIAGNOSIS — Z79899 Other long term (current) drug therapy: Secondary | ICD-10-CM | POA: Insufficient documentation

## 2023-02-08 DIAGNOSIS — N289 Disorder of kidney and ureter, unspecified: Secondary | ICD-10-CM | POA: Insufficient documentation

## 2023-02-08 DIAGNOSIS — G8929 Other chronic pain: Secondary | ICD-10-CM | POA: Insufficient documentation

## 2023-02-08 DIAGNOSIS — Z87891 Personal history of nicotine dependence: Secondary | ICD-10-CM | POA: Insufficient documentation

## 2023-02-08 DIAGNOSIS — M6281 Muscle weakness (generalized): Secondary | ICD-10-CM

## 2023-02-08 DIAGNOSIS — R262 Difficulty in walking, not elsewhere classified: Secondary | ICD-10-CM

## 2023-02-08 LAB — COMPREHENSIVE METABOLIC PANEL
ALT: 13 U/L (ref 0–44)
AST: 24 U/L (ref 15–41)
Albumin: 3.8 g/dL (ref 3.5–5.0)
Alkaline Phosphatase: 80 U/L (ref 38–126)
Anion gap: 10 (ref 5–15)
BUN: 23 mg/dL (ref 8–23)
CO2: 27 mmol/L (ref 22–32)
Calcium: 9.1 mg/dL (ref 8.9–10.3)
Chloride: 98 mmol/L (ref 98–111)
Creatinine, Ser: 1.3 mg/dL — ABNORMAL HIGH (ref 0.61–1.24)
GFR, Estimated: 53 mL/min — ABNORMAL LOW (ref >60)
Glucose, Bld: 194 mg/dL — ABNORMAL HIGH (ref 70–99)
Potassium: 4.3 mmol/L (ref 3.5–5.1)
Sodium: 135 mmol/L (ref 135–145)
Total Bilirubin: 0.6 mg/dL (ref 0.3–1.2)
Total Protein: 6.7 g/dL (ref 6.5–8.1)

## 2023-02-08 LAB — CBC WITH DIFFERENTIAL/PLATELET
Abs Immature Granulocytes: 0.02 10*3/uL (ref 0.00–0.07)
Basophils Absolute: 0.1 10*3/uL (ref 0.0–0.1)
Basophils Relative: 1 %
Eosinophils Absolute: 0.1 10*3/uL (ref 0.0–0.5)
Eosinophils Relative: 3 %
HCT: 40.8 % (ref 39.0–52.0)
Hemoglobin: 13 g/dL (ref 13.0–17.0)
Immature Granulocytes: 1 %
Lymphocytes Relative: 22 %
Lymphs Abs: 0.8 10*3/uL (ref 0.7–4.0)
MCH: 32.3 pg (ref 26.0–34.0)
MCHC: 31.9 g/dL (ref 30.0–36.0)
MCV: 101.2 fL — ABNORMAL HIGH (ref 80.0–100.0)
Monocytes Absolute: 0.6 10*3/uL (ref 0.1–1.0)
Monocytes Relative: 15 %
Neutro Abs: 2.2 10*3/uL (ref 1.7–7.7)
Neutrophils Relative %: 58 %
Platelets: 87 10*3/uL — ABNORMAL LOW (ref 150–400)
RBC: 4.03 MIL/uL — ABNORMAL LOW (ref 4.22–5.81)
RDW: 15.7 % — ABNORMAL HIGH (ref 11.5–15.5)
WBC: 3.7 10*3/uL — ABNORMAL LOW (ref 4.0–10.5)
nRBC: 0 % (ref 0.0–0.2)

## 2023-02-08 NOTE — Progress Notes (Signed)
Oral Chemotherapy Clinic [facility]  [phone] [phone]  Patient Care Team: Dorothey Baseman, MD as PCP - General (Family Medicine) End, Cristal Deer, MD as PCP - Cardiology (Cardiology) Quentin Cornwall, MD (Endocrinology) Jeralyn Ruths, MD as Consulting Physician (Hematology and Oncology) Kemper Durie, RN as [facility] Care Management   Name of the patient: Jack [name]  [id number]  [birth year]   Date of visit: 02/08/23  HPI: Patient is a 89 y.o. male with newly diagnosed multiple myeloma. Currently treated with Revlimid (lenalidomide) and dexamethasone. Patient was initiated on an all oral regimen because he was unable to physically make it into clinic at the time treatment due to his disease/performance status. His status improved he was able to come back to inperson appts on 07/14/21.  Reason for Consult: Oral chemotherapy follow-up for lenalidomide therapy.   PAST MEDICAL HISTORY: Past Medical History:  Diagnosis Date   Ascending aortic aneurysm (HCC)    a. 12/2020 Echo: Asc Ao 48mm, Ao root 40mm. b. 03/2022 Asc Ao 4.6 cm (4.5 cm in 2019)   CKD (chronic kidney disease), stage III (HCC)    Diastolic dysfunction    a. 12/2020 Echo: EF 50-55%, no rwma, mild LVH, GrI DD, nl RV fxn, mild AI. Asc Ao 48mm, Ao root 40mm.   Elevated prostate specific antigen (PSA)    has been 7 for a year    GERD (gastroesophageal reflux disease)    History of colon polyps 2008   [facility],    History of kidney stones    Hyperlipidemia    Hypertension    Myeloma (HCC)    PAF (paroxysmal atrial fibrillation) (HCC)    a. 01/2021-->Eliquis (CHA2DS2VASc = 3-4).   Pain    Prostate hypertrophy    diagnosed at age 23 due to hematospermia    HEMATOLOGY/ONCOLOGY HISTORY:  Oncology History  Kappa light chain myeloma (HCC)  03/12/2021 Initial Diagnosis   Kappa light chain myeloma (HCC)   03/27/2021 - 04/06/2021 Chemotherapy   Patient  is on Treatment Plan : MYELOMA NON-TRANSPLANT CANDIDATES VRd SQ q21d      04/16/2021 Cancer Staging   Staging form: Plasma Cell Myeloma and Plasma Cell Disorders, AJCC 8th Edition - Clinical stage from 04/16/2021: Albumin (g/dL): 3.8, ISS: Stage II, High-risk cytogenetics: Absent, LDH: Unknown - Signed by Jeralyn Ruths, MD on 04/16/2021 Albumin range (g/dL): Greater than or equal to 3.5 Cytogenetics: t(11;14) translocation Serum calcium level: Normal Serum creatinine level: Normal Bone disease on imaging: Present     ALLERGIES:  is allergic to quinolones, amlodipine, azithromycin, lipitor [atorvastatin], lisinopril, and zetia [ezetimibe].  MEDICATIONS:  Current Outpatient Medications  Medication Sig Dispense Refill   acetaminophen (TYLENOL) 500 MG tablet Take 500-1,000 mg by mouth every 8 (eight) hours as needed for moderate pain.     albuterol (VENTOLIN HFA) 108 (90 Base) MCG/ACT inhaler Inhale into the lungs.     apixaban (ELIQUIS) 5 MG TABS tablet Take 1 tablet (5 mg total) by mouth 2 (two) times daily. 180 tablet 0   Ascorbic Acid (VITAMIN C) 1000 MG tablet Take 1,000 mg by mouth in the morning and at bedtime.     Calcium Carb-Cholecalciferol (OYSTER SHELL CALCIUM W/D) 500-5 MG-MCG TABS Take 1 tablet by mouth in the morning and at bedtime.     carvedilol (COREG) 12.5 MG tablet Take 1 tablet (12.5 mg total) by mouth 2 (two) times daily. 180 tablet 3   dexamethasone (DECADRON) 4 MG tablet TAKE 5 TABLETS  BY MOUTH ONE TIME PER WEEK 20 tablet 4   feeding supplement (ENSURE ENLIVE / ENSURE PLUS) LIQD Take 237 mLs by mouth 3 (three) times daily between meals. (Patient taking differently: Take 237 mLs by mouth daily with lunch.) 237 mL 12   gabapentin (NEURONTIN) 300 MG capsule Take 2 capsules (600 mg total) by mouth 3 (three) times daily. 180 capsule 2   ipratropium-albuterol (DUONEB) 0.5-2.5 (3) MG/3ML SOLN Take 3 mLs by nebulization every 4 (four) hours as needed. 360 mL 0   lenalidomide  (REVLIMID) 20 MG capsule Take 1 capsule (20 mg total) by mouth daily. Take for 21 days, then hold for 7 days. Repeat every 28 days 21 capsule 0   lidocaine (XYLOCAINE) 5 % ointment Apply topically 3 (three) times daily as needed for mild pain or moderate pain. 50 g 2   morphine (MS CONTIN) 30 MG 12 hr tablet Take 1 tablet (30 mg total) by mouth every 8 (eight) hours. 90 tablet 0   Multiple Vitamin (MULTIVITAMIN WITH MINERALS) TABS tablet Take 1 tablet by mouth daily.     Multiple Vitamins-Minerals (PRESERVISION AREDS PO) Take 1 capsule by mouth in the morning and at bedtime.     mupirocin ointment (BACTROBAN) 2 % Apply 1 Application topically 2 (two) times daily. 22 g 0   naloxone (NARCAN) nasal spray 4 mg/0.1 mL SPRAY 1 SPRAY INTO ONE NOSTRIL AS DIRECTED FOR OPIOID OVERDOSE (TURN PERSON ON SIDE AFTER DOSE. IF NO RESPONSE IN 2-3 MINUTES OR PERSON RESPONDS BUT RELAPSES, REPEAT USING A NEW SPRAY DEVICE AND SPRAY INTO THE OTHER NOSTRIL. CALL 911 AFTER USE.) * EMERGENCY USE ONLY * 1 each 0   omeprazole (PRILOSEC) 20 MG capsule Take 1 capsule (20 mg total) by mouth daily. 30 capsule 11   Oxycodone HCl 10 MG TABS Take 1 tablet (10 mg total) by mouth every 4 (four) hours as needed (pain). 90 tablet 0   polyethylene glycol (MIRALAX / GLYCOLAX) 17 g packet Take 17 g by mouth 2 (two) times daily.  0   potassium citrate (UROCIT-K) 10 MEQ (1080 MG) SR tablet TAKE 1 TABLET (10 MEQ TOTAL) BY MOUTH 3 (THREE) TIMES DAILY WITH MEALS. 270 tablet 2   senna-docusate (SENOKOT-S) 8.6-50 MG tablet Take 1 tablet by mouth 2 (two) times daily. 30 tablet 0   No current facility-administered medications for this visit.    VITAL SIGNS: There were no vitals taken for this visit. There were no vitals filed for this visit.  Estimated body mass index is 24.04 kg/m as calculated from the following:   Height as of an earlier encounter on 02/08/23: 5\' 8"  (1.727 m).   Weight as of an earlier encounter on 02/08/23: 71.7 kg (158 lb  1.6 oz).  LABS: CBC:    Component Value Date/Time   WBC 3.7 (L) 02/08/2023 0908   HGB 13.0 02/08/2023 0908   HGB 11.0 (L) 02/24/2021 1014   HCT 40.8 02/08/2023 0908   HCT 35.3 (L) 02/24/2021 1014   PLT 87 (L) 02/08/2023 0908   PLT 249 02/24/2021 1014   MCV 101.2 (H) 02/08/2023 0908   MCV 96 02/24/2021 1014   MCV 90 01/24/2013 0819   NEUTROABS 2.2 02/08/2023 0908   NEUTROABS 7.4 (H) 01/24/2013 0819   LYMPHSABS 0.8 02/08/2023 0908   LYMPHSABS 1.2 01/24/2013 0819   MONOABS 0.6 02/08/2023 0908   MONOABS 0.3 01/24/2013 0819   EOSABS 0.1 02/08/2023 0908   EOSABS 0.0 01/24/2013 0819   BASOSABS 0.1 02/08/2023  0908   BASOSABS 0.0 01/24/2013 0819   Comprehensive Metabolic Panel:    Component Value Date/Time   NA 135 02/08/2023 0908   NA 136 02/24/2021 1014   NA 138 01/24/2013 0819   K 4.3 02/08/2023 0908   K 3.8 01/24/2013 0819   CL 98 02/08/2023 0908   CL 105 01/24/2013 0819   CO2 27 02/08/2023 0908   CO2 25 01/24/2013 0819   BUN 23 02/08/2023 0908   BUN 23 02/24/2021 1014   BUN 20 (H) 01/24/2013 0819   CREATININE 1.30 (H) 02/08/2023 0908   CREATININE 1.52 (H) 01/24/2013 0819   GLUCOSE 194 (H) 02/08/2023 0908   GLUCOSE 171 (H) 01/24/2013 0819   CALCIUM 9.1 02/08/2023 0908   CALCIUM 10.4 (H) 02/19/2021 1229   AST 24 02/08/2023 0908   AST 18 01/24/2013 0819   ALT 13 02/08/2023 0908   ALT 15 01/24/2013 0819   ALKPHOS 80 02/08/2023 0908   ALKPHOS 74 01/24/2013 0819   BILITOT 0.6 02/08/2023 0908   BILITOT 0.4 02/24/2021 1014   BILITOT 0.5 01/24/2013 0819   PROT 6.7 02/08/2023 0908   PROT 6.2 02/24/2021 1014   PROT 6.3 (L) 01/24/2013 0819   ALBUMIN 3.8 02/08/2023 0908   ALBUMIN 4.5 02/24/2021 1014   ALBUMIN 3.6 01/24/2013 0819     Present during today's visit: Patient and his wife Jack [name]  Assessment and Plan: CBC/CMP reviewed with patient, okay to continue with lenalidomide 20mg , 21 days on/7off and dexamethasone 20mg  weekly Pain: patient to be referred by MD  to pain management clinic   Oral Chemotherapy Adherence: no reported missed doses.  No patient barriers to medication adherence identified.   New medications: none reported  Medication Access Issues: no issues, fills Revlimid at [facility]  Patient expressed understanding and was in agreement with this plan. He also understands that He can call clinic at any time with any questions, concerns, or complaints.   Follow-up plan: RTC in 4 weeks  Thank you for allowing me to participate in the care of this very pleasant patient.   Time Total: 15 mins  Visit consisted of counseling and education on dealing with issues of symptom management in the setting of serious and potentially life-threatening illness.Greater than 50%  of this time was spent counseling and coordinating care related to the above assessment and plan.  Signed by: Remi Haggard, PharmD, BCPS, Nolon Bussing, CPP Hematology/Oncology Clinical Pharmacist Practitioner [place]/DB/AP Oral Chemotherapy Navigation [facility] [phone]  02/08/2023 10:15 AM

## 2023-02-08 NOTE — Progress Notes (Signed)
[facility]  Telephone:(336) [phone] Fax:(336) [phone]  ID: Jack [name] OB: 1933-01-30  MR#: [id number]  AOZ#:[id number]  Patient Care Team: Dorothey Baseman, MD as PCP - General (Family Medicine) End, Cristal Deer, MD as PCP - Cardiology (Cardiology) Quentin Cornwall, MD (Endocrinology) Jeralyn Ruths, MD as Consulting Physician (Hematology and Oncology) Kemper Durie, RN as [facility] Care Management   CHIEF COMPLAINT: Stage II kappa chain myeloma.  INTERVAL HISTORY: Patient returns to clinic today for further evaluation and continuation of Revlimid.  He continues to have significant back pain, and now is complaining of "loose teeth".  He otherwise feels well and is tolerating his treatments without significant side effects.  He has no neurologic complaints.  He denies any fevers.  He has a good appetite and denies weight loss.  He denies chest pain, shortness of breath, cough, or hemoptysis.  He denies any nausea, vomiting, constipation, or diarrhea.  He has no urinary complaints.  Patient offers no further specific complaints today.  REVIEW OF SYSTEMS:   Review of Systems  Constitutional: Negative.  Negative for fever and malaise/fatigue.  Respiratory: Negative.  Negative for cough, hemoptysis and shortness of breath.   Cardiovascular: Negative.  Negative for chest pain and leg swelling.  Gastrointestinal: Negative.  Negative for abdominal pain.  Genitourinary: Negative.  Negative for dysuria and flank pain.  Musculoskeletal:  Positive for back pain. Negative for falls and joint pain.  Skin: Negative.  Negative for rash.  Neurological: Negative.  Negative for dizziness, focal weakness, weakness and headaches.  Psychiatric/Behavioral: Negative.  The patient is not nervous/anxious.     As per HPI. Otherwise, a complete review of systems is negative.  PAST MEDICAL HISTORY: Past Medical History:  Diagnosis Date   Ascending aortic aneurysm  (HCC)    a. 12/2020 Echo: Asc Ao 48mm, Ao root 40mm. b. 03/2022 Asc Ao 4.6 cm (4.5 cm in 2019)   CKD (chronic kidney disease), stage III (HCC)    Diastolic dysfunction    a. 12/2020 Echo: EF 50-55%, no rwma, mild LVH, GrI DD, nl RV fxn, mild AI. Asc Ao 48mm, Ao root 40mm.   Elevated prostate specific antigen (PSA)    has been 7 for a year    GERD (gastroesophageal reflux disease)    History of colon polyps 2008   [facility],    History of kidney stones    Hyperlipidemia    Hypertension    Myeloma (HCC)    PAF (paroxysmal atrial fibrillation) (HCC)    a. 01/2021-->Eliquis (CHA2DS2VASc = 3-4).   Pain    Prostate hypertrophy    diagnosed at age 31 due to hematospermia    PAST SURGICAL HISTORY: Past Surgical History:  Procedure Laterality Date   CATARACT EXTRACTION W/PHACO Left 01/10/2018   Procedure: CATARACT EXTRACTION PHACO AND INTRAOCULAR LENS PLACEMENT (IOC);  Surgeon: Galen Manila, MD;  Location: [facility] ORS;  Service: Ophthalmology;  Laterality: Left;  [name] 00:24.8 AP% 14.9 CDE 3.68 Fluid pack lot # [id number]   CATARACT EXTRACTION W/PHACO Right 01/25/2018   Procedure: CATARACT EXTRACTION PHACO AND INTRAOCULAR LENS PLACEMENT (IOC);  Surgeon: Galen Manila, MD;  Location: [facility] ORS;  Service: Ophthalmology;  Laterality: Right;  [name] 00:42 AP% 10.8 CDE 4.59 Fluid pack lot # [id number]   COLON SURGERY     CYSTOSCOPY W/ URETERAL STENT PLACEMENT Right 10/16/2017   Procedure: right  URETERAL STENT PLACEMENT,cystoscopy bilateral stent removal,rretrograde;  Surgeon: Riki Altes, MD;  Location: [facility] ORS;  Service: Urology;  Laterality: Right;   CYSTOSCOPY/URETEROSCOPY/HOLMIUM LASER/STENT PLACEMENT Right 12/16/2020   Procedure: CYSTOSCOPY/URETEROSCOPY/HOLMIUM LASER/STENT PLACEMENT;  Surgeon: Riki Altes, MD;  Location: [facility] ORS;  Service: Urology;  Laterality: Right;   EXTRACORPOREAL SHOCK WAVE LITHOTRIPSY Right 12/11/2020   Procedure: EXTRACORPOREAL SHOCK WAVE LITHOTRIPSY (ESWL);   Surgeon: Riki Altes, MD;  Location: [facility] ORS;  Service: Urology;  Laterality: Right;   IR BONE TUMOR(S)RF ABLATION  04/16/2022   IR KYPHO EA ADDL LEVEL THORACIC OR LUMBAR  02/02/2021   IR KYPHO LUMBAR INC FX REDUCE BONE BX UNI/BIL CANNULATION INC/IMAGING  02/02/2021   IR KYPHO THORACIC WITH BONE BIOPSY  04/15/2022   KIDNEY STONE SURGERY     KYPHOPLASTY N/A 03/12/2021   Procedure: Nicki Reaper, L3;  Surgeon: Kennedy Bucker, MD;  Location: [facility] ORS;  Service: Orthopedics;  Laterality: N/A;   RESECTION SOFT TISSUE TUMOR LEG / ANKLE RADICAL  jan 2009   [facility],  right thigh/knee , nonmalignant   SMALL INTESTINE SURGERY  1946   implaed on picket fence, punctured stomach   TONSILLECTOMY      FAMILY HISTORY: Family History  Problem Relation Age of Onset   Hypertension Father    Hyperlipidemia Father    Cancer Sister        thyroid - dx in late 20's    ADVANCED DIRECTIVES (Y/N):  N  HEALTH MAINTENANCE: Social History   Tobacco Use   Smoking status: Former    Types: Cigarettes    Quit date: 07/05/1965    Years since quitting: 57.6   Smokeless tobacco: Never  Vaping Use   Vaping Use: Never used  Substance Use Topics   Alcohol use: Not Currently    Alcohol/week: 1.0 standard drink of alcohol    Types: 1 Standard drinks or equivalent per week    Comment: occassionaly   Drug use: No     Colonoscopy:  PAP:  Bone density:  Lipid panel:  Allergies  Allergen Reactions   Quinolones     Aortic dilation contraindicates FQ   Amlodipine Swelling and Other (See Comments)    LE edema   Azithromycin Nausea And Vomiting   Lipitor [Atorvastatin]     Muscle pain in RT Leg    Lisinopril Cough   Zetia [Ezetimibe]     Pain in legs     Current Outpatient Medications  Medication Sig Dispense Refill   acetaminophen (TYLENOL) 500 MG tablet Take 500-1,000 mg by mouth every 8 (eight) hours as needed for moderate pain.     albuterol (VENTOLIN HFA) 108 (90 Base) MCG/ACT inhaler Inhale into  the lungs.     apixaban (ELIQUIS) 5 MG TABS tablet Take 1 tablet (5 mg total) by mouth 2 (two) times daily. 180 tablet 0   Ascorbic Acid (VITAMIN C) 1000 MG tablet Take 1,000 mg by mouth in the morning and at bedtime.     Calcium Carb-Cholecalciferol (OYSTER SHELL CALCIUM W/D) 500-5 MG-MCG TABS Take 1 tablet by mouth in the morning and at bedtime.     carvedilol (COREG) 12.5 MG tablet Take 1 tablet (12.5 mg total) by mouth 2 (two) times daily. 180 tablet 3   dexamethasone (DECADRON) 4 MG tablet TAKE 5 TABLETS BY MOUTH ONE TIME PER WEEK 20 tablet 4   feeding supplement (ENSURE ENLIVE / ENSURE PLUS) LIQD Take 237 mLs by mouth 3 (three) times daily between meals. (Patient taking differently: Take 237 mLs by mouth daily with lunch.) 237 mL 12   gabapentin (NEURONTIN) 300 MG capsule Take 2  capsules (600 mg total) by mouth 3 (three) times daily. 180 capsule 2   ipratropium-albuterol (DUONEB) 0.5-2.5 (3) MG/3ML SOLN Take 3 mLs by nebulization every 4 (four) hours as needed. 360 mL 0   lenalidomide (REVLIMID) 20 MG capsule Take 1 capsule (20 mg total) by mouth daily. Take for 21 days, then hold for 7 days. Repeat every 28 days 21 capsule 0   lidocaine (XYLOCAINE) 5 % ointment Apply topically 3 (three) times daily as needed for mild pain or moderate pain. 50 g 2   morphine (MS CONTIN) 30 MG 12 hr tablet Take 1 tablet (30 mg total) by mouth every 8 (eight) hours. 90 tablet 0   Multiple Vitamin (MULTIVITAMIN WITH MINERALS) TABS tablet Take 1 tablet by mouth daily.     Multiple Vitamins-Minerals (PRESERVISION AREDS PO) Take 1 capsule by mouth in the morning and at bedtime.     mupirocin ointment (BACTROBAN) 2 % Apply 1 Application topically 2 (two) times daily. 22 g 0   naloxone (NARCAN) nasal spray 4 mg/0.1 mL SPRAY 1 SPRAY INTO ONE NOSTRIL AS DIRECTED FOR OPIOID OVERDOSE (TURN PERSON ON SIDE AFTER DOSE. IF NO RESPONSE IN 2-3 MINUTES OR PERSON RESPONDS BUT RELAPSES, REPEAT USING A NEW SPRAY DEVICE AND SPRAY INTO  THE OTHER NOSTRIL. CALL 911 AFTER USE.) * EMERGENCY USE ONLY * 1 each 0   omeprazole (PRILOSEC) 20 MG capsule Take 1 capsule (20 mg total) by mouth daily. 30 capsule 11   Oxycodone HCl 10 MG TABS Take 1 tablet (10 mg total) by mouth every 4 (four) hours as needed (pain). 90 tablet 0   polyethylene glycol (MIRALAX / GLYCOLAX) 17 g packet Take 17 g by mouth 2 (two) times daily.  0   potassium citrate (UROCIT-K) 10 MEQ (1080 MG) SR tablet TAKE 1 TABLET (10 MEQ TOTAL) BY MOUTH 3 (THREE) TIMES DAILY WITH MEALS. 270 tablet 2   senna-docusate (SENOKOT-S) 8.6-50 MG tablet Take 1 tablet by mouth 2 (two) times daily. 30 tablet 0   No current facility-administered medications for this visit.    OBJECTIVE: Vitals:   02/08/23 0917  BP: 104/84  Pulse: 66  Resp: 18  Temp: 98.2 F (36.8 C)  SpO2: 97%        Body mass index is 24.04 kg/m.    ECOG FS:1 - Symptomatic but completely ambulatory  General: Well-developed, well-nourished, no acute distress. Eyes: Pink conjunctiva, anicteric sclera. HEENT: Normocephalic, moist mucous membranes. Lungs: No audible wheezing or coughing. Heart: Regular rate and rhythm. Abdomen: Soft, nontender, no obvious distention. Musculoskeletal: No edema, cyanosis, or clubbing. Neuro: Alert, answering all questions appropriately. Cranial nerves grossly intact. Skin: No rashes or petechiae noted. Psych: Normal affect.  LAB RESULTS:  Lab Results  Component Value Date   NA 135 02/08/2023   K 4.3 02/08/2023   CL 98 02/08/2023   CO2 27 02/08/2023   GLUCOSE 194 (H) 02/08/2023   BUN 23 02/08/2023   CREATININE 1.30 (H) 02/08/2023   CALCIUM 9.1 02/08/2023   PROT 6.7 02/08/2023   ALBUMIN 3.8 02/08/2023   AST 24 02/08/2023   ALT 13 02/08/2023   ALKPHOS 80 02/08/2023   BILITOT 0.6 02/08/2023   GFRNONAA 53 (L) 02/08/2023   GFRAA 29 (L) 10/16/2017    Lab Results  Component Value Date   WBC 3.7 (L) 02/08/2023   NEUTROABS 2.2 02/08/2023   HGB 13.0 02/08/2023    HCT 40.8 02/08/2023   MCV 101.2 (H) 02/08/2023   PLT 87 (L) 02/08/2023  STUDIES: MR Thoracic Spine W Wo Contrast  Result Date: 01/22/2023 CLINICAL DATA:  Metastatic disease evaluation EXAM: MRI THORACIC WITHOUT AND WITH CONTRAST TECHNIQUE: Multiplanar and multiecho pulse sequences of the thoracic spine were obtained without and with intravenous contrast. CONTRAST:  7mL GADAVIST GADOBUTROL 1 MMOL/ML IV SOLN COMPARISON:  MRI thoracic spine 05/25/2022 and 03/25/2022. FINDINGS: Alignment:  No substantial sagittal subluxation. Vertebrae: No substantial change in enhancing lesions at T8, T9 and T11. Similar heterogeneous marrow, compatible with history of multiple myeloma. Similar chronic compression fractures at T11, T12, L1, and L2 with prior vertebral augmentation. More inferior lumbar bodies are not imaged on this study. No evidence of acute fracture. Cord:  Normal cord signal. Paraspinal and other soft tissues:  Known ascending thoracic aortic aneurysm is suboptimally assessed by protocol. Paraspinal soft tissues otherwise within normal limits. Disc levels: Similar mild for age multilevel degenerative change without high-grade canal or foraminal narrowing. Similar mild to moderate foraminal narrowing bilaterally at T10-T11 and T11-T12. IMPRESSION: 1. Similar heterogeneous bone marrow, compatible with reported history of multiple myeloma. Similar STIR signal abnormality and enhancement involving multiple lower thoracic levels without evidence of progressive disease. 2. Similar chronic compression fractures. No evidence of acute fracture. Electronically Signed   By: Feliberto Harts M.D.   On: 01/22/2023 17:07    ASSESSMENT: Stage II Kappa chain myeloma.  PLAN:    Stage II kappa chain myeloma: (11:14 translocation, high risk) SPEP essentially negative and immunoglobulins are within normal limits.  Patient's kappa light chains initially improved to greater than 5400 down to 39.1.  They trended up  briefly during hospitalization and not taking Revlimid, but are now trending back down and most recently reported at 95.1.  Cycle 1 of treatment included Velcade and Revlimid, but given his declining performance status at the time and difficulty to get into clinic this was transitioned to Revlimid only.  Given persistent neutropenia, Revlimid was previously dose reduced to 15 mg daily, but patient is now tolerating an increased dose of 20 mg for 21 days with 7 days off.   He is also taking 20 mg dexamethasone weekly.  PET scan results from December 08, 2021 reviewed independently with no obvious evidence of progressive disease.  Nuclear med bone scan results from December 28, 2022 did not reveal any concerning pathology.  Patient initiated monthly Xgeva on August 11, 2021.  Continue Revlimid as prescribed.  Will hold Xgeva until patient can be evaluated by dentistry.  Return to clinic in 4 weeks for laboratory work and further evaluation.  Appreciate clinical pharmacy input.   Anemia: Resolved. Bone lesions: Hold next fever as above today. Neutropenia: Resolved.  Continue 20 mg Revlimid as above.   Thrombocytopenia: Chronic and unchanged.  Patient's platelet count is 87.   Pain: Chronic and unchanged.  Repeat MRI as above.  Nuclear medicine bone scan completed on December 28, 2022 did not report any active areas of myeloma.  Patient only takes long-acting morphine 30 mg every 8 hours.  He also takes Tylenol and ibuprofen sparingly.  Previously he was instructed that if his platelets fall below 50 this would need to be discontinued.  Radiation oncology has determined no further treatments are needed at this time.  Appreciate palliative care input.  A referral has been sent to pain clinic.  Continue physical therapy as ordered.   Renal insufficiency: Chronic and unchanged.  Patient's creatinine is 1.30. "Loose teeth": Hold Xgeva as above.  Patient has been instructed to have his dentist evaluate.  Patient expressed  understanding and was in agreement with this plan. He also understands that He can call clinic at any time with any questions, concerns, or complaints.     Cancer Staging  Kappa light chain myeloma (HCC) Staging form: Plasma Cell Myeloma and Plasma Cell Disorders, AJCC 8th Edition - Clinical stage from 04/16/2021: Albumin (g/dL): 3.8, ISS: Stage II, High-risk cytogenetics: Absent, LDH: Unknown - Signed by Jeralyn Ruths, MD on 04/16/2021 Albumin range (g/dL): Greater than or equal to 3.5 Cytogenetics: t(11;14) translocation Serum calcium level: Normal Serum creatinine level: Normal Bone disease on imaging: Present  Jeralyn Ruths, MD   02/08/2023 9:38 AM

## 2023-02-08 NOTE — Progress Notes (Signed)
Back pain is still pretty bad and consistent along his lower back. Not able to get out and walk like he was. Asked about pain specialist and a Physiatrist. Teeth are loose and aching.

## 2023-02-08 NOTE — Therapy (Signed)
OUTPATIENT PHYSICAL THERAPY THORACOLUMBAR TREATMENT   Patient Name: Jack [name] MRN: [id number] DOB:02-Sep-1933, 89 y.o., male Today's Date: 02/03/2023  END OF SESSION:  PT End of Session - 02/08/23 1608     Visit Number 6    Number of Visits 24    Date for PT Re-Evaluation 04/14/23    PT Start Time 1603    PT Stop Time 1645    PT Time Calculation (min) 42 min    Activity Tolerance Patient tolerated treatment well    Behavior During Therapy [facility] for tasks assessed/performed             Past Medical History:  Diagnosis Date   Ascending aortic aneurysm (HCC)    a. 12/2020 Echo: Asc Ao 48mm, Ao root 40mm. b. 03/2022 Asc Ao 4.6 cm (4.5 cm in 2019)   CKD (chronic kidney disease), stage III (HCC)    Diastolic dysfunction    a. 12/2020 Echo: EF 50-55%, no rwma, mild LVH, GrI DD, nl RV fxn, mild AI. Asc Ao 48mm, Ao root 40mm.   Elevated prostate specific antigen (PSA)    has been 7 for a year    GERD (gastroesophageal reflux disease)    History of colon polyps 2008   [facility],    History of kidney stones    Hyperlipidemia    Hypertension    Myeloma (HCC)    PAF (paroxysmal atrial fibrillation) (HCC)    a. 01/2021-->Eliquis (CHA2DS2VASc = 3-4).   Pain    Prostate hypertrophy    diagnosed at age 3 due to hematospermia   Past Surgical History:  Procedure Laterality Date   CATARACT EXTRACTION W/PHACO Left 01/10/2018   Procedure: CATARACT EXTRACTION PHACO AND INTRAOCULAR LENS PLACEMENT (IOC);  Surgeon: Galen Manila, MD;  Location: [facility] ORS;  Service: Ophthalmology;  Laterality: Left;  [name] 00:24.8 AP% 14.9 CDE 3.68 Fluid pack lot # [id number]   CATARACT EXTRACTION W/PHACO Right 01/25/2018   Procedure: CATARACT EXTRACTION PHACO AND INTRAOCULAR LENS PLACEMENT (IOC);  Surgeon: Galen Manila, MD;  Location: [facility] OR[facility];  Service: Ophthalmology;  Laterality: Right;  [name] 00:42 AP% 10.8 CDE 4.59 Fluid pack lot # [id number]   COLON SURGERY     CYSTOSCOPY W/ URETERAL STENT  PLACEMENT Right 10/16/2017   Procedure: right  URETERAL STENT PLACEMENT,cystoscopy bilateral stent removal,rretrograde;  Surgeon: Riki Altes, MD;  Location: [facility] ORS;  Service: Urology;  Laterality: Right;   CYSTOSCOPY/URETEROSCOPY/HOLMIUM LASER/STENT PLACEMENT Right 12/16/2020   Procedure: CYSTOSCOPY/URETEROSCOPY/HOLMIUM LASER/STENT PLACEMENT;  Surgeon: Riki Altes, MD;  Location: [facility] OR[facility];  Service: Urology;  Laterality: Right;   EXTRACORPOREAL SHOCK WAVE LITHOTRIPSY Right 12/11/2020   Procedure: EXTRACORPOREAL SHOCK WAVE LITHOTRIPSY (ESWL);  Surgeon: Riki Altes, MD;  Location: [facility] ORS;  Service: Urology;  Laterality: Right;   IR BONE TUMOR(S)RF ABLATION  04/16/2022   IR KYPHO EA ADDL LEVEL THORACIC OR LUMBAR  02/02/2021   IR KYPHO LUMBAR INC FX REDUCE BONE BX UNI/BIL CANNULATION INC/IMAGING  02/02/2021   IR KYPHO THORACIC WITH BONE BIOPSY  04/15/2022   KIDNEY STONE SURGERY     KYPHOPLASTY N/A 03/12/2021   Procedure: Nicki Reaper, L3;  Surgeon: Kennedy Bucker, MD;  Location: [facility] OR[facility];  Service: Orthopedics;  Laterality: N/A;   RESECTION SOFT TISSUE TUMOR LEG / ANKLE RADICAL  jan 2009   [facility],  right thigh/knee , nonmalignant   SMALL INTESTINE SURGERY  1946   implaed on picket fence, punctured stomach   TONSILLECTOMY     Patient Active Problem List  Diagnosis Date Noted   Aneurysm of ascending aorta without rupture (HCC) 08/26/2022   Statin myopathy 08/26/2022   Postural dizziness with presyncope 06/11/2022   Pelvic fracture (HCC) 06/10/2022   Multiple myeloma (HCC) 05/05/2021   Compression fracture of body of thoracic vertebra (HCC)    Acute hypoxemic respiratory failure (HCC) 03/26/2021   Right flank pain 03/26/2021   Multiple pathological fractures 03/14/2021   Kappa light chain myeloma (HCC)    Back pain 03/11/2021   Atrial fibrillation (HCC) 03/11/2021   Compression fracture of L2 and L3 03/11/2021   Hyperlipidemia LDL goal <70    Constipation    Palliative care  encounter    Actinic keratosis 02/19/2021   Inguinal hernia 02/19/2021   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   Numbness of foot 02/19/2021   Sciatica 02/19/2021   Shoulder pain 02/19/2021   Palliative care patient 02/17/2021   Closed compression fracture of body of L1 vertebra (HCC) 02/07/2021   Protein-calorie malnutrition, severe 01/31/2021   Unsteadiness on feet 01/20/2021   Chronic kidney disease, stage 3b (HCC) 01/07/2021   Collapsed vertebra, not elsewhere classified, thoracic region, subsequent encounter for fracture with routine healing 01/07/2021   Muscle weakness (generalized) 01/07/2021   Prostate hyperplasia, benign localized, without urinary obstruction 01/07/2021   Nonrheumatic mitral (valve) insufficiency 01/07/2021   Hypokalemia    Normocytic anemia    Atherosclerosis of aorta (HCC)    Irregular heart rhythm    T12 compression fracture (HCC) 01/02/2021   History of kidney stones    Acute kidney injury superimposed on CKD (HCC)    Ureteral stone    Ureteral perforation secondary to stent manipulation 10/16/2017   Abdominal pain 10/16/2017   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/26/2016   Hyperplasia, prostate 02/03/2015   Multinodular goiter 07/03/2014   Thyroid nodule 06/20/2014   Nontoxic uninodular goiter 06/20/2014   Edema 09/24/2013   Encounter for preventive health examination 08/21/2013   Personal history of colonic polyps 08/21/2013   History of venomous snake bite 08/21/2013   Cough 10/02/2012   Prostate hypertrophy    Elevated prostate specific antigen (PSA)    HYPERTRIGLYCERIDEMIA 12/21/2006   Essential hypertension 12/21/2006   GERD 12/21/2006   CALCULUS, KIDNEY 12/21/2006    PCP: Dorothey Baseman, MD   REFERRING PROVIDER: Borders, Daryl Eastern, NP   REFERRING DIAG: C90.00 (ICD-10-CM) - Kappa light chain myeloma (HCC)    Rationale for Evaluation and Treatment: Rehabilitation  THERAPY DIAG:  Muscle weakness  (generalized)  Difficulty in walking, not elsewhere classified  Other low back pain  ONSET DATE: >6 months  SUBJECTIVE:  SUBJECTIVE STATEMENT: Pt reports that he is doing better. Only mild pain noted in the Lateral mid back 3/10. Pt was seen by Oncology prior to PT treatment. Small medication adjustment due to new feeling of loose teeth. Has been referred to pain specialist, but it will be several months until initial consultation. MD requesting pt to continue to PT at this time for pain management.     PERTINENT HISTORY:  89 y.o. male with multiple medical problems including hypertension, CKD stage IIIb, A. fib on Eliquis, and anemia.  Patient was hospitalized 01/02/2021 to 01/07/2021 with T12 compression fracture with recommendation for conservative management including use of a TLSO brace.  Patient was hospitalized again 01/29/2021 to 02/07/2021 with recurrent back pain and MR showing T12 and new L1 compression fracture.  Patient underwent T12/L1 kyphoplasty on 02/02/2021.  There was also suspicion of a left fifth rib fracture, which occurred while transitioning from the stretcher for his kyphoplasty.  Patient had mild hypercalcemia with SPEP and UPEP concerning for possible myeloma.  Patient was referred to [facility] for work-up.  He was readmitted 03/11/2021 - 03/17/2021 with worsening back pain.  CT revealed new compression fractures of L2 and L3.  Patient underwent kyphoplasty on 6/30.  He was hospitalized again 03/26/2021-04/07/2021 with severe back/flank pain.  MRI revealed T10 pathologic compression fracture.  He was started on treatment for multiple myeloma with dexamethasone/Velcade and XRT to spine.   PAIN:  Are you having pain? Yes: NPRS scale: 2-3/10 Pain location:  mid to lower back R >L   Pain  description: "pain" Aggravating factors: getting out of bed Relieving factors: Heat, muscle rub,     PRECAUTIONS: Back for comfort   WEIGHT BEARING RESTRICTIONS: No  FALLS:  Has patient fallen in last 6 months? Yes. [place]   LIVING ENVIRONMENT: Lives with: lives with their spouse Lives in: House/apartment Stairs: No Has following equipment at home: Single point cane  OCCUPATION: retired.   PLOF: Independent, Independent with basic ADLs, and Independent with community mobility without device  Pt reports only recent use of SPC   over the past month   PATIENT GOALS: reduce back pain and improve balance.   NEXT MD VISIT: June 2024  OBJECTIVE:   DIAGNOSTIC FINDINGS:   5/72024: New MRI Pending     CLINICAL DATA:  Multiple myeloma. EXAM: NUCLEAR MEDICINE WHOLE BODY BONE SCAN IMPRESSION: No evident malignant lesion by MDP whole-body bone imaging.    CLINICAL DATA:  Mid to low back pain. History of multiple myeloma. Prior kyphoplasty. EXAM: MRI THORACIC AND LUMBAR SPINE WITHOUT CONTRAST IMPRESSION: 1. Remote compression fractures of T11, T12, L1, L2 and L3, status post vertebral augmentation. No acute abnormality of the thoracic or lumbar spine. 2. Previously described enhancing lesions at T8 and T9 are poorly characterized without contrast. There unenhanced appearance is unchanged. 3. No spinal canal or neural foraminal stenosis.    PATIENT SURVEYS:  FOTO 55  SCREENING FOR RED FLAGS: Bowel or bladder incontinence: No Spinal tumors: No Cauda equina syndrome: No Compression fracture: Yes: multiple kyphoplasty  Abdominal aneurysm: No  COGNITION: Overall cognitive status: Within functional limits for tasks assessed     SENSATION: Light touch: Impaired  and baseline neuropathy in distal BLE to mid shin  in BLE   MUSCLE LENGTH: Hamstrings: Right  deg; Left  deg Maisie Fus test: Right  deg; Left  deg bas POSTURE: rounded shoulders,  forward head, and decreased lumbar lordosis  PALPATION: Sensitive  to palpation on the lower R side of the back and R SI joint. Noted to have multiple trigger point to paraspinals R>L and R side QL.   LUMBAR ROM:   AROM eval  Flexion 10  Extension 10  Right lateral flexion 15  Left lateral flexion 15  Right rotation 40  Left rotation 45   (Blank rows = not tested)  LOWER EXTREMITY ROM:     Active  Right eval Left eval  Hip flexion WFL   Hip extension    Hip abduction    Hip adduction    Hip internal rotation    Hip external rotation    Knee flexion    Knee extension    Ankle dorsiflexion    Ankle plantarflexion    Ankle inversion    Ankle eversion     (Blank rows = not tested)  LOWER EXTREMITY MMT:    MMT Right eval Left eval  Hip flexion 4- 4-  Hip extension 4 4  Hip abduction 4+ 4+  Hip adduction 5 5  Hip internal rotation    Hip external rotation    Knee flexion 5 5  Knee extension 5 5  Ankle dorsiflexion 5 5  Ankle plantarflexion    Ankle inversion    Ankle eversion     (Blank rows = not tested)  LUMBAR SPECIAL TESTS:  Prone instability test: Negative, Slump test: Negative, and SI Compression/distraction test: Negative  FUNCTIONAL TESTS:  5 times sit to stand: 12.9 hands pushing from thights  Timed up and go (TUG): 16.93 3 minute walk test: 638ft  10 meter walk test: 0.9 m/s  Berg Balance Scale: 46/56 Functional gait assessment: 22/30  GAIT: Distance walked:  649 Assistive device utilized: Single point cane Level of assistance: Modified independence Comments: decreased hip extension or pelvic rotation   TODAY'S TREATMENT:                                                                                                                              DATE: 02/03/2023  Foot tap on 4 inch step x 15 with BUE support. Additional x 8 bil without UE support  SLS 2 x 10 sec with intermittent UE support   Hip extension BUE support at rial x 10 bil.   Lateral step ups on 4inch step with Bil UE support x 8 bil   Seated self stretch: Single knee to chest 2x 35 sec Figure 4 stretch 2x 35 sec  Pt requesting to use restroom. Pt ambulated to rest room with [facility] and toileted without assist from PT.   Standing self stretch  Heel cord/gastroc x 30 sec  Hip flexor stretch 2x30 sec Cues for posture and BLE positioning on each bout to improve intensity of stretch to target muscle groups.    Pt reports that he feels like lower back my be slightly looser at end session, and continues to be grateful for PT to reduce pain and  improve QOL.    PATIENT EDUCATION:  Education details: POC. Pt educated throughout session about proper posture and technique with exercises. Improved exercise technique, movement at target joints, use of target muscles after min to mod verbal, visual, tactile cues.  Person educated: Patient Education method: Medical illustrator Education comprehension: verbalized understanding  HOME EXERCISE PROGRAM: Access Code: [email or web address] URL: [email or web address] Date: 01/28/2023 Prepared by: Grier Rocher  Exercises - Standing Tandem Balance with Counter Support  - 1 x daily - 5 x weekly - 3 sets - 2 reps - 10 hold - Standing Single Leg Stance with Counter Support  - 1 x daily - 5 x weekly - 3 sets - 2 reps - 10 hold - Standing Hip Abduction with Counter Support  - 1 x daily - 5 x weekly - 2 sets - 10 reps - 2 hold  ASSESSMENT:  CLINICAL IMPRESSION: Patient presents to PT treatment with good effort throughout session, reports reduced pain at following PT treatment on this day. Pt tolerated gentle hip/BLE stretching and strengthening on this day as well as dynamic balance and core stability training. Continues to demonstrate improved BLE ROM and tolerance to self stretches with improve technique compared to prior sessions.  Pt will benefit from skilled PT to improved balance, strength, spinal ROM, and tolerance  to standing as well as reduce Low back pain and improve QoL.    OBJECTIVE IMPAIRMENTS: decreased balance, decreased endurance, decreased mobility, difficulty walking, decreased ROM, decreased strength, hypomobility, impaired perceived functional ability, impaired sensation, improper body mechanics, and postural dysfunction.   ACTIVITY LIMITATIONS: carrying, lifting, standing, squatting, stairs, and locomotion level  PARTICIPATION LIMITATIONS: laundry, shopping, community activity, and yard work  PERSONAL FACTORS: Age and 3+ comorbidities: CA, hypertension, CKD III  are also affecting patient's functional outcome.   REHAB POTENTIAL: Good  CLINICAL DECISION MAKING: Evolving/moderate complexity  EVALUATION COMPLEXITY: Moderate   GOALS: Goals reviewed with patient? No   SHORT TERM GOALS: Target date: 6/120/2024    Patient will be independent in home exercise program to improve strength/mobility for better functional independence with ADLs. Baseline: initiated on 01/20/2023 Goal status: INITIAL   LONG TERM GOALS: Target date: 04/15/2023    Patient will increase FOTO score to equal to or greater than 60   to demonstrate statistically significant improvement in mobility and quality of life.  Baseline: 55 Goal status: INITIAL  2.  Patient (> 64 years old) will complete five times sit to stand test in < 11 seconds indicating an increased LE strength and improved balance. Baseline: 12.9 Goal status: INITIAL  3.  Patient will increase Berg Balance score by > 6 points to demonstrate decreased fall risk during functional activities Baseline: 43 Goal status: INITIAL  4.  Patient will increase 10 meter walk test to >1.7m/s as to improve gait speed for better community ambulation and to reduce fall risk. Baseline: 0.9 Goal status: INITIAL  5.  Patient will reduce timed up and go to <11 seconds to reduce fall risk and demonstrate improved transfer/gait ability. Baseline: 16.9sec Goal  status: INITIAL  6.  Patient will increase FGA >4points as to demonstrate reduced fall risk and improved dynamic gait balance for better safety with community/home ambulation.   Baseline: 22 Goal status: INITIAL    PLAN:  PT FREQUENCY: 1-2x/week  PT DURATION: 12 weeks  PLANNED INTERVENTIONS: Therapeutic exercises, Therapeutic activity, Neuromuscular re-education, Balance training, Gait training, Patient/Family education, Self Care, Joint mobilization, Joint manipulation, Stair training, DME instructions, Dry Needling, Spinal mobilization, Moist  heat, scar mobilization, and Manual therapy.  PLAN FOR NEXT SESSION:   Balance and BLE strength training.  Improved hip mobility/stretching.  Reassess HEP and adjust as needed.   Gentle Manual therapy for pain modulation.  Gentle therex of low/mid back stabilization.   Grier Rocher PT, DPT  Physical Therapist - [place]  [facility]  5:19 PM 02/03/23

## 2023-02-09 LAB — KAPPA/LAMBDA LIGHT CHAINS
Kappa free light chain: 120.2 mg/L — ABNORMAL HIGH (ref 3.3–19.4)
Kappa, lambda light chain ratio: 6.29 — ABNORMAL HIGH (ref 0.26–1.65)
Lambda free light chains: 19.1 mg/L (ref 5.7–26.3)

## 2023-02-10 ENCOUNTER — Ambulatory Visit: Payer: PPO | Admitting: Physical Therapy

## 2023-02-10 DIAGNOSIS — M5459 Other low back pain: Secondary | ICD-10-CM

## 2023-02-10 DIAGNOSIS — M6281 Muscle weakness (generalized): Secondary | ICD-10-CM

## 2023-02-10 DIAGNOSIS — R262 Difficulty in walking, not elsewhere classified: Secondary | ICD-10-CM

## 2023-02-10 NOTE — Therapy (Signed)
OUTPATIENT PHYSICAL THERAPY THORACOLUMBAR TREATMENT   Patient Name: Jack [name] MRN: [id number] DOB:[birth year], 89 y.o., male Today's Date: 02/11/2023  END OF SESSION:  PT End of Session - 02/10/23 1603     Visit Number 7    Number of Visits 24    Date for PT Re-Evaluation 04/14/23    PT Start Time 1604    PT Stop Time 1646    PT Time Calculation (min) 42 min    Activity Tolerance Patient tolerated treatment well    Behavior During Therapy [facility] for tasks assessed/performed             Past Medical History:  Diagnosis Date   Ascending aortic aneurysm (HCC)    a. 12/2020 Echo: Asc Ao 48mm, Ao root 40mm. b. 03/2022 Asc Ao 4.6 cm (4.5 cm in 2019)   CKD (chronic kidney disease), stage III (HCC)    Diastolic dysfunction    a. 12/2020 Echo: EF 50-55%, no rwma, mild LVH, GrI DD, nl RV fxn, mild AI. Asc Ao 48mm, Ao root 40mm.   Elevated prostate specific antigen (PSA)    has been 7 for a year    GERD (gastroesophageal reflux disease)    History of colon polyps 2008   [facility],    History of kidney stones    Hyperlipidemia    Hypertension    Myeloma (HCC)    PAF (paroxysmal atrial fibrillation) (HCC)    a. 01/2021-->Eliquis (CHA2DS2VASc = 3-4).   Pain    Prostate hypertrophy    diagnosed at age 36 due to hematospermia   Past Surgical History:  Procedure Laterality Date   CATARACT EXTRACTION W/PHACO Left 01/10/2018   Procedure: CATARACT EXTRACTION PHACO AND INTRAOCULAR LENS PLACEMENT (IOC);  Surgeon: Galen Manila, MD;  Location: [facility] ORS;  Service: Ophthalmology;  Laterality: Left;  [name] 00:24.8 AP% 14.9 CDE 3.68 Fluid pack lot # [id number]   CATARACT EXTRACTION W/PHACO Right 01/25/2018   Procedure: CATARACT EXTRACTION PHACO AND INTRAOCULAR LENS PLACEMENT (IOC);  Surgeon: Galen Manila, MD;  Location: [facility] ORS;  Service: Ophthalmology;  Laterality: Right;  [name] 00:42 AP% 10.8 CDE 4.59 Fluid pack lot # [id number]   COLON SURGERY     CYSTOSCOPY W/ URETERAL STENT  PLACEMENT Right 10/16/2017   Procedure: right  URETERAL STENT PLACEMENT,cystoscopy bilateral stent removal,rretrograde;  Surgeon: Riki Altes, MD;  Location: [facility] ORS;  Service: Urology;  Laterality: Right;   CYSTOSCOPY/URETEROSCOPY/HOLMIUM LASER/STENT PLACEMENT Right 12/16/2020   Procedure: CYSTOSCOPY/URETEROSCOPY/HOLMIUM LASER/STENT PLACEMENT;  Surgeon: Riki Altes, MD;  Location: [facility] OR[facility];  Service: Urology;  Laterality: Right;   EXTRACORPOREAL SHOCK WAVE LITHOTRIPSY Right 12/11/2020   Procedure: EXTRACORPOREAL SHOCK WAVE LITHOTRIPSY (ESWL);  Surgeon: Riki Altes, MD;  Location: [facility] OR[facility];  Service: Urology;  Laterality: Right;   IR BONE TUMOR(S)RF ABLATION  04/16/2022   IR KYPHO EA ADDL LEVEL THORACIC OR LUMBAR  02/02/2021   IR KYPHO LUMBAR INC FX REDUCE BONE BX UNI/BIL CANNULATION INC/IMAGING  02/02/2021   IR KYPHO THORACIC WITH BONE BIOPSY  04/15/2022   KIDNEY STONE SURGERY     KYPHOPLASTY N/A 03/12/2021   Procedure: Nicki Reaper, L3;  Surgeon: Kennedy Bucker, MD;  Location: [facility] ORS;  Service: Orthopedics;  Laterality: N/A;   RESECTION SOFT TISSUE TUMOR LEG / ANKLE RADICAL  jan 2009   [facility],  right thigh/knee , nonmalignant   SMALL INTESTINE SURGERY  1946   implaed on picket fence, punctured stomach   TONSILLECTOMY     Patient Active Problem List  Diagnosis Date Noted   Aneurysm of ascending aorta without rupture (HCC) 08/26/2022   Statin myopathy 08/26/2022   Postural dizziness with presyncope 06/11/2022   Pelvic fracture (HCC) 06/10/2022   Multiple myeloma (HCC) 05/05/2021   Compression fracture of body of thoracic vertebra (HCC)    Acute hypoxemic respiratory failure (HCC) 03/26/2021   Right flank pain 03/26/2021   Multiple pathological fractures 03/14/2021   Kappa light chain myeloma (HCC)    Back pain 03/11/2021   Atrial fibrillation (HCC) 03/11/2021   Compression fracture of L2 and L3 03/11/2021   Hyperlipidemia LDL goal <70    Constipation    Palliative care  encounter    Actinic keratosis 02/19/2021   Inguinal hernia 02/19/2021   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   Numbness of foot 02/19/2021   Sciatica 02/19/2021   Shoulder pain 02/19/2021   Palliative care patient 02/17/2021   Closed compression fracture of body of L1 vertebra (HCC) 02/07/2021   Protein-calorie malnutrition, severe 01/31/2021   Unsteadiness on feet 01/20/2021   Chronic kidney disease, stage 3b (HCC) 01/07/2021   Collapsed vertebra, not elsewhere classified, thoracic region, subsequent encounter for fracture with routine healing 01/07/2021   Muscle weakness (generalized) 01/07/2021   Prostate hyperplasia, benign localized, without urinary obstruction 01/07/2021   Nonrheumatic mitral (valve) insufficiency 01/07/2021   Hypokalemia    Normocytic anemia    Atherosclerosis of aorta (HCC)    Irregular heart rhythm    T12 compression fracture (HCC) 01/02/2021   History of kidney stones    Acute kidney injury superimposed on CKD (HCC)    Ureteral stone    Ureteral perforation secondary to stent manipulation 10/16/2017   Abdominal pain 10/16/2017   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/26/2016   Hyperplasia, prostate 02/03/2015   Multinodular goiter 07/03/2014   Thyroid nodule 06/20/2014   Nontoxic uninodular goiter 06/20/2014   Edema 09/24/2013   Encounter for preventive health examination 08/21/2013   Personal history of colonic polyps 08/21/2013   History of venomous snake bite 08/21/2013   Cough 10/02/2012   Prostate hypertrophy    Elevated prostate specific antigen (PSA)    HYPERTRIGLYCERIDEMIA 12/21/2006   Essential hypertension 12/21/2006   GERD 12/21/2006   CALCULUS, KIDNEY 12/21/2006    PCP: Dorothey Baseman, MD   REFERRING PROVIDER: Borders, Daryl Eastern, NP   REFERRING DIAG: C90.00 (ICD-10-CM) - Kappa light chain myeloma (HCC)    Rationale for Evaluation and Treatment: Rehabilitation  THERAPY DIAG:  Muscle weakness  (generalized)  Difficulty in walking, not elsewhere classified  Other low back pain  ONSET DATE: >6 months  SUBJECTIVE:  SUBJECTIVE STATEMENT: Pt reports that he is doing okay today.  No new medical updates.  Pain reported as 3-4/10 At start of PT treatment   PERTINENT HISTORY:  89 y.o. male with multiple medical problems including hypertension, CKD stage IIIb, A. fib on Eliquis, and anemia.  Patient was hospitalized 01/02/2021 to 01/07/2021 with T12 compression fracture with recommendation for conservative management including use of a TLSO brace.  Patient was hospitalized again 01/29/2021 to 02/07/2021 with recurrent back pain and MR showing T12 and new L1 compression fracture.  Patient underwent T12/L1 kyphoplasty on 02/02/2021.  There was also suspicion of a left fifth rib fracture, which occurred while transitioning from the stretcher for his kyphoplasty.  Patient had mild hypercalcemia with SPEP and UPEP concerning for possible myeloma.  Patient was referred to [facility] for work-up.  He was readmitted 03/11/2021 - 03/17/2021 with worsening back pain.  CT revealed new compression fractures of L2 and L3.  Patient underwent kyphoplasty on 6/30.  He was hospitalized again 03/26/2021-04/07/2021 with severe back/flank pain.  MRI revealed T10 pathologic compression fracture.  He was started on treatment for multiple myeloma with dexamethasone/Velcade and XRT to spine.   PAIN:  Are you having pain? Yes: NPRS scale: 2-3/10 Pain location:  mid to lower back R >L   Pain description: "pain" Aggravating factors: getting out of bed Relieving factors: Heat, muscle rub,     PRECAUTIONS: Back for comfort   WEIGHT BEARING RESTRICTIONS: No  FALLS:  Has patient fallen in last 6 months? Yes. [place]   LIVING ENVIRONMENT: Lives with: lives with their spouse Lives in: House/apartment Stairs: No Has following equipment at home: Single point cane  OCCUPATION: retired.   PLOF: Independent, Independent with basic ADLs, and Independent with community mobility without device  Pt reports only recent use of SPC   over the past month   PATIENT GOALS: reduce back pain and improve balance.   NEXT MD VISIT: June 2024  OBJECTIVE:   DIAGNOSTIC FINDINGS:   5/72024: New MRI Pending     CLINICAL DATA:  Multiple myeloma. EXAM: NUCLEAR MEDICINE WHOLE BODY BONE SCAN IMPRESSION: No evident malignant lesion by MDP whole-body bone imaging.    CLINICAL DATA:  Mid to low back pain. History of multiple myeloma. Prior kyphoplasty. EXAM: MRI THORACIC AND LUMBAR SPINE WITHOUT CONTRAST IMPRESSION: 1. Remote compression fractures of T11, T12, L1, L2 and L3, status post vertebral augmentation. No acute abnormality of the thoracic or lumbar spine. 2. Previously described enhancing lesions at T8 and T9 are poorly characterized without contrast. There unenhanced appearance is unchanged. 3. No spinal canal or neural foraminal stenosis.    PATIENT SURVEYS:  FOTO 55  SCREENING FOR RED FLAGS: Bowel or bladder incontinence: No Spinal tumors: No Cauda equina syndrome: No Compression fracture: Yes: multiple kyphoplasty  Abdominal aneurysm: No  COGNITION: Overall cognitive status: Within functional limits for tasks assessed     SENSATION: Light touch: Impaired  and baseline neuropathy in distal BLE to mid shin  in BLE   MUSCLE LENGTH: Hamstrings: Right  deg; Left  deg Maisie Fus test: Right  deg; Left  deg bas POSTURE: rounded shoulders, forward head, and decreased lumbar lordosis  PALPATION: Sensitive to palpation on the lower R side of the back and R SI joint. Noted to have multiple trigger point to paraspinals R>L and R side QL.   LUMBAR ROM:   AROM eval  Flexion 10   Extension 10  Right  lateral flexion 15  Left lateral flexion 15  Right rotation 40  Left rotation 45   (Blank rows = not tested)  LOWER EXTREMITY ROM:     Active  Right eval Left eval  Hip flexion WFL   Hip extension    Hip abduction    Hip adduction    Hip internal rotation    Hip external rotation    Knee flexion    Knee extension    Ankle dorsiflexion    Ankle plantarflexion    Ankle inversion    Ankle eversion     (Blank rows = not tested)  LOWER EXTREMITY MMT:    MMT Right eval Left eval  Hip flexion 4- 4-  Hip extension 4 4  Hip abduction 4+ 4+  Hip adduction 5 5  Hip internal rotation    Hip external rotation    Knee flexion 5 5  Knee extension 5 5  Ankle dorsiflexion 5 5  Ankle plantarflexion    Ankle inversion    Ankle eversion     (Blank rows = not tested)  LUMBAR SPECIAL TESTS:  Prone instability test: Negative, Slump test: Negative, and SI Compression/distraction test: Negative  FUNCTIONAL TESTS:  5 times sit to stand: 12.9 hands pushing from thights  Timed up and go (TUG): 16.93 3 minute walk test: 655ft  10 meter walk test: 0.9 m/s  Berg Balance Scale: 46/56 Functional gait assessment: 22/30  GAIT: Distance walked:  649 Assistive device utilized: Single point cane Level of assistance: Modified independence Comments: decreased hip extension or pelvic rotation   TODAY'S TREATMENT:                                                                                                                              DATE: 02/11/2023   SLS 2 x 10 sec with intermittent UE support   Hip abduction 2.5# ankle weight x 12 bil  Hip extension 2.5# BUE support at rail x 10 bil.  Standing on Airex beam:  Tandem stance 2 x 30 seconds intermittent upper extremity support SLS 2 x 15 seconds bilateral.   Seated self stretch: Single knee to chest 2x 35 sec Figure 4 stretch 2x 35 sec  Seated HS stretch 2 x 30 sec     Standing self stretch  Hip flexor  stretch 2 x 15 seconds bilateral Heel cord/gastroc x 30 sec on wedge Soleus stretch 2x30 seec bil  Cues for posture and BLE positioning on each bout to improve intensity of stretch to target muscle groups.    PT performed manual therapy: STM right flank QL, paraspinals x 3 minutes patient reports increased pain in right side of low back following STM requesting therapist to discontinue intervention    PATIENT EDUCATION:  Education details: POC. Pt educated throughout session about proper posture and technique with exercises. Improved exercise technique, movement at target joints, use of target muscles after min to mod verbal, visual, tactile cues.  Person educated: Patient Education method: Medical illustrator Education comprehension: verbalized understanding  HOME EXERCISE PROGRAM: Access Code: [email or web address] URL: [email or web address] Date: 01/28/2023 Prepared by: Grier Rocher  Exercises - Standing Tandem Balance with Counter Support  - 1 x daily - 5 x weekly - 3 sets - 2 reps - 10 hold - Standing Single Leg Stance with Counter Support  - 1 x daily - 5 x weekly - 3 sets - 2 reps - 10 hold - Standing Hip Abduction with Counter Support  - 1 x daily - 5 x weekly - 2 sets - 10 reps - 2 hold  ASSESSMENT:  CLINICAL IMPRESSION: Patient presents to PT treatment with good effort throughout session.mild increased pain following STM at end of PT session on this day.  Patient demonstrates improved range of motion and heel cord and hip flexors on this day.  Cues to remain within pain-free range throughout all self stretches.  Noted improved use of ankle strategy and increase deep core activation to maintain balance on Airex pad on this day.  Patient reports that he will miss physical therapy for the next week, PT encouraged patient to continue HEP at least once to twice a day while on vacation as able.  Pt will benefit from skilled PT to improved balance, strength, spinal ROM,  and tolerance to standing as well as reduce Low back pain and improve QoL.    OBJECTIVE IMPAIRMENTS: decreased balance, decreased endurance, decreased mobility, difficulty walking, decreased ROM, decreased strength, hypomobility, impaired perceived functional ability, impaired sensation, improper body mechanics, and postural dysfunction.   ACTIVITY LIMITATIONS: carrying, lifting, standing, squatting, stairs, and locomotion level  PARTICIPATION LIMITATIONS: laundry, shopping, community activity, and yard work  PERSONAL FACTORS: Age and 3+ comorbidities: CA, hypertension, CKD III  are also affecting patient's functional outcome.   REHAB POTENTIAL: Good  CLINICAL DECISION MAKING: Evolving/moderate complexity  EVALUATION COMPLEXITY: Moderate   GOALS: Goals reviewed with patient? No   SHORT TERM GOALS: Target date: 6/120/2024    Patient will be independent in home exercise program to improve strength/mobility for better functional independence with ADLs. Baseline: initiated on 01/20/2023 Goal status: INITIAL   LONG TERM GOALS: Target date: 04/15/2023    Patient will increase FOTO score to equal to or greater than 60   to demonstrate statistically significant improvement in mobility and quality of life.  Baseline: 55 Goal status: INITIAL  2.  Patient (> 38 years old) will complete five times sit to stand test in < 11 seconds indicating an increased LE strength and improved balance. Baseline: 12.9 Goal status: INITIAL  3.  Patient will increase Berg Balance score by > 6 points to demonstrate decreased fall risk during functional activities Baseline: 43 Goal status: INITIAL  4.  Patient will increase 10 meter walk test to >1.40m/s as to improve gait speed for better community ambulation and to reduce fall risk. Baseline: 0.9 Goal status: INITIAL  5.  Patient will reduce timed up and go to <11 seconds to reduce fall risk and demonstrate improved transfer/gait ability. Baseline:  16.9sec Goal status: INITIAL  6.  Patient will increase FGA >4points as to demonstrate reduced fall risk and improved dynamic gait balance for better safety with community/home ambulation.   Baseline: 22 Goal status: INITIAL    PLAN:  PT FREQUENCY: 1-2x/week  PT DURATION: 12 weeks  PLANNED INTERVENTIONS: Therapeutic exercises, Therapeutic activity, Neuromuscular re-education, Balance training, Gait training, Patient/Family education, Self Care, Joint mobilization, Joint manipulation, Stair training, DME instructions, Dry Needling, Spinal  mobilization, Moist heat, scar mobilization, and Manual therapy.  PLAN FOR NEXT SESSION:   Balance and BLE strength training.  Improved hip mobility/stretching.  Reassess HEP and adjust as needed.   Gentle therex of low/mid back stabilization.   Grier Rocher PT, DPT  Physical Therapist - [facility]  11:34 AM 02/11/23

## 2023-02-15 ENCOUNTER — Ambulatory Visit: Payer: PPO | Admitting: Oncology

## 2023-02-15 ENCOUNTER — Ambulatory Visit: Payer: PPO | Admitting: Pharmacist

## 2023-02-15 ENCOUNTER — Other Ambulatory Visit: Payer: PPO

## 2023-02-21 ENCOUNTER — Other Ambulatory Visit: Payer: Self-pay | Admitting: *Deleted

## 2023-02-21 ENCOUNTER — Encounter: Payer: Self-pay | Admitting: Oncology

## 2023-02-21 ENCOUNTER — Ambulatory Visit: Payer: Self-pay | Admitting: *Deleted

## 2023-02-21 ENCOUNTER — Ambulatory Visit: Payer: PPO

## 2023-02-21 DIAGNOSIS — C9000 Multiple myeloma not having achieved remission: Secondary | ICD-10-CM

## 2023-02-21 DIAGNOSIS — R634 Abnormal weight loss: Secondary | ICD-10-CM

## 2023-02-21 MED ORDER — OXYCODONE HCL 10 MG PO TABS: 10.0000 mg | ORAL_TABLET | ORAL | 0 refills | Status: DC | PRN

## 2023-02-21 MED ORDER — LIDOCAINE 5 % EX OINT
TOPICAL_OINTMENT | Freq: Three times a day (TID) | CUTANEOUS | 2 refills | Status: DC | PRN
Start: 2023-02-21 — End: 2023-06-06

## 2023-02-21 NOTE — Patient Outreach (Signed)
  Care Coordination   Follow Up Visit Note   02/21/2023 Name: Jack [name] MRN: [id number] DOB: 30-May-1933  Jack [name] is a 89 y.o. year old male who sees Dorothey Baseman, MD for primary care. I spoke with  Jack [name] and wife by phone today.  What matters to the patients health and wellness today?  Find better relief for back pain    Goals Addressed             This Visit's Progress    Effective management of back pain   Not on track    Care Coordination Interventions: Reviewed provider established plan for pain management Discussed importance of adherence to all scheduled medical appointments Counseled on the importance of reporting any/all new or changed pain symptoms or management strategies to pain management provider Discussed use of relaxation techniques and/or diversional activities to assist with pain reduction (distraction, imagery, relaxation, massage, acupressure, TENS, heat, and cold application Reviewed with patient prescribed pharmacological and nonpharmacological pain relief strategies Follow up appointments: cardiology on 6/21          SDOH assessments and interventions completed:  No     Care Coordination Interventions:  Yes, provided   Interventions Today    Flowsheet Row Most Recent Value  Chronic Disease   Chronic disease during today's visit Other  [back pain, 7/10]  General Interventions   General Interventions Discussed/Reviewed General Interventions Reviewed, Doctor Visits  Doctor Visits Discussed/Reviewed Doctor Visits Reviewed, Specialist  [attending outpatient PT, couldn't attend today due to pain.  Will see pain specialist on 7/3.  Also has cardiology appointment on 6/21 and dentist on 6/19]  PCP/Specialist Visits Compliance with follow-up visit  Education Interventions   Education Provided Provided Education  Provided Verbal Education On Medication  [Using lidocaine patches for pain, minimal relief.  cough for the last  couple weeks, using Mucinex for relief]       Follow up plan: Follow up call scheduled for 7/15    Encounter Outcome:  Pt. Visit Completed   Kemper Durie, RN, MSN, [facility] Care Management Care Management Coordinator [phone]

## 2023-02-22 ENCOUNTER — Encounter: Payer: PPO | Admitting: Physical Therapy

## 2023-02-24 ENCOUNTER — Other Ambulatory Visit: Payer: Self-pay | Admitting: *Deleted

## 2023-02-24 ENCOUNTER — Ambulatory Visit: Payer: PPO | Admitting: Physical Therapy

## 2023-02-24 ENCOUNTER — Encounter: Payer: Self-pay | Admitting: Oncology

## 2023-02-24 MED ORDER — MORPHINE SULFATE ER 30 MG PO TBCR: 30.0000 mg | EXTENDED_RELEASE_TABLET | Freq: Three times a day (TID) | ORAL | 0 refills | Status: DC

## 2023-02-28 DIAGNOSIS — D2271 Melanocytic nevi of right lower limb, including hip: Secondary | ICD-10-CM | POA: Diagnosis not present

## 2023-02-28 DIAGNOSIS — D2261 Melanocytic nevi of right upper limb, including shoulder: Secondary | ICD-10-CM | POA: Diagnosis not present

## 2023-02-28 DIAGNOSIS — Z85828 Personal history of other malignant neoplasm of skin: Secondary | ICD-10-CM | POA: Diagnosis not present

## 2023-02-28 DIAGNOSIS — L821 Other seborrheic keratosis: Secondary | ICD-10-CM | POA: Diagnosis not present

## 2023-02-28 DIAGNOSIS — D225 Melanocytic nevi of trunk: Secondary | ICD-10-CM | POA: Diagnosis not present

## 2023-02-28 DIAGNOSIS — D2272 Melanocytic nevi of left lower limb, including hip: Secondary | ICD-10-CM | POA: Diagnosis not present

## 2023-02-28 DIAGNOSIS — D2262 Melanocytic nevi of left upper limb, including shoulder: Secondary | ICD-10-CM | POA: Diagnosis not present

## 2023-02-28 DIAGNOSIS — D044 Carcinoma in situ of skin of scalp and neck: Secondary | ICD-10-CM | POA: Diagnosis not present

## 2023-02-28 DIAGNOSIS — D485 Neoplasm of uncertain behavior of skin: Secondary | ICD-10-CM | POA: Diagnosis not present

## 2023-02-28 DIAGNOSIS — D692 Other nonthrombocytopenic purpura: Secondary | ICD-10-CM | POA: Diagnosis not present

## 2023-02-28 DIAGNOSIS — Z08 Encounter for follow-up examination after completed treatment for malignant neoplasm: Secondary | ICD-10-CM | POA: Diagnosis not present

## 2023-03-01 ENCOUNTER — Encounter: Payer: PPO | Admitting: Physical Therapy

## 2023-03-01 ENCOUNTER — Other Ambulatory Visit: Payer: Self-pay | Admitting: *Deleted

## 2023-03-01 DIAGNOSIS — C9000 Multiple myeloma not having achieved remission: Secondary | ICD-10-CM

## 2023-03-01 DIAGNOSIS — H353231 Exudative age-related macular degeneration, bilateral, with active choroidal neovascularization: Secondary | ICD-10-CM | POA: Diagnosis not present

## 2023-03-01 MED ORDER — LENALIDOMIDE 20 MG PO CAPS
20.0000 mg | ORAL_CAPSULE | Freq: Every day | ORAL | 0 refills | Status: DC
Start: 2023-03-01 — End: 2023-03-28

## 2023-03-02 ENCOUNTER — Encounter: Payer: Self-pay | Admitting: Physical Therapy

## 2023-03-02 ENCOUNTER — Ambulatory Visit: Payer: PPO | Admitting: Internal Medicine

## 2023-03-02 ENCOUNTER — Ambulatory Visit: Payer: PPO | Attending: Hospice and Palliative Medicine | Admitting: Physical Therapy

## 2023-03-02 DIAGNOSIS — R262 Difficulty in walking, not elsewhere classified: Secondary | ICD-10-CM | POA: Diagnosis not present

## 2023-03-02 DIAGNOSIS — M6281 Muscle weakness (generalized): Secondary | ICD-10-CM | POA: Diagnosis not present

## 2023-03-02 DIAGNOSIS — M5459 Other low back pain: Secondary | ICD-10-CM | POA: Diagnosis not present

## 2023-03-02 NOTE — Therapy (Addendum)
OUTPATIENT PHYSICAL THERAPY THORACOLUMBAR TREATMENT   Patient Name: Jack [name] MRN: [id number] DOB:[birth year], 89 y.o., male Today's Date: 03/02/2023  END OF SESSION:  PT End of Session - 03/02/23 1143     Visit Number 8    Number of Visits 24    Date for PT Re-Evaluation 04/14/23    Progress Note Due on Visit 10    PT Start Time 1144    PT Stop Time 1226    PT Time Calculation (min) 42 min    Activity Tolerance Patient tolerated treatment well    Behavior During Therapy [facility] for tasks assessed/performed              Past Medical History:  Diagnosis Date   Ascending aortic aneurysm (HCC)    a. 12/2020 Echo: Asc Ao 48mm, Ao root 40mm. b. 03/2022 Asc Ao 4.6 cm (4.5 cm in 2019)   CKD (chronic kidney disease), stage III (HCC)    Diastolic dysfunction    a. 12/2020 Echo: EF 50-55%, no rwma, mild LVH, GrI DD, nl RV fxn, mild AI. Asc Ao 48mm, Ao root 40mm.   Elevated prostate specific antigen (PSA)    has been 7 for a year    GERD (gastroesophageal reflux disease)    History of colon polyps 2008   [facility],    History of kidney stones    Hyperlipidemia    Hypertension    Myeloma (HCC)    PAF (paroxysmal atrial fibrillation) (HCC)    a. 01/2021-->Eliquis (CHA2DS2VASc = 3-4).   Pain    Prostate hypertrophy    diagnosed at age 53 due to hematospermia   Past Surgical History:  Procedure Laterality Date   CATARACT EXTRACTION W/PHACO Left 01/10/2018   Procedure: CATARACT EXTRACTION PHACO AND INTRAOCULAR LENS PLACEMENT (IOC);  Surgeon: Galen Manila, MD;  Location: [facility];  Service: Ophthalmology;  Laterality: Left;  [name] 00:24.8 AP% 14.9 CDE 3.68 Fluid pack lot # [id number]   CATARACT EXTRACTION W/PHACO Right 01/25/2018   Procedure: CATARACT EXTRACTION PHACO AND INTRAOCULAR LENS PLACEMENT (IOC);  Surgeon: Galen Manila, MD;  Location: [facility];  Service: Ophthalmology;  Laterality: Right;  [name] 00:42 AP% 10.8 CDE 4.59 Fluid pack lot # [id number]   COLON SURGERY      CYSTOSCOPY W/ URETERAL STENT PLACEMENT Right 10/16/2017   Procedure: right  URETERAL STENT PLACEMENT,cystoscopy bilateral stent removal,rretrograde;  Surgeon: Riki Altes, MD;  Location: [facility];  Service: Urology;  Laterality: Right;   CYSTOSCOPY/URETEROSCOPY/HOLMIUM LASER/STENT PLACEMENT Right 12/16/2020   Procedure: CYSTOSCOPY/URETEROSCOPY/HOLMIUM LASER/STENT PLACEMENT;  Surgeon: Riki Altes, MD;  Location: [facility];  Service: Urology;  Laterality: Right;   EXTRACORPOREAL SHOCK WAVE LITHOTRIPSY Right 12/11/2020   Procedure: EXTRACORPOREAL SHOCK WAVE LITHOTRIPSY (ESWL);  Surgeon: Riki Altes, MD;  Location: [facility];  Service: Urology;  Laterality: Right;   IR BONE TUMOR(S)RF ABLATION  04/16/2022   IR KYPHO EA ADDL LEVEL THORACIC OR LUMBAR  02/02/2021   IR KYPHO LUMBAR INC FX REDUCE BONE BX UNI/BIL CANNULATION INC/IMAGING  02/02/2021   IR KYPHO THORACIC WITH BONE BIOPSY  04/15/2022   KIDNEY STONE SURGERY     KYPHOPLASTY N/A 03/12/2021   Procedure: Nicki Reaper, L3;  Surgeon: Kennedy Bucker, MD;  Location: [facility];  Service: Orthopedics;  Laterality: N/A;   RESECTION SOFT TISSUE TUMOR LEG / ANKLE RADICAL  jan 2009   [facility],  right thigh/knee , nonmalignant   SMALL INTESTINE SURGERY  1946   implaed on picket fence, punctured stomach  TONSILLECTOMY     Patient Active Problem List   Diagnosis Date Noted   Aneurysm of ascending aorta without rupture (HCC) 08/26/2022   Statin myopathy 08/26/2022   Postural dizziness with presyncope 06/11/2022   Pelvic fracture (HCC) 06/10/2022   Multiple myeloma (HCC) 05/05/2021   Compression fracture of body of thoracic vertebra (HCC)    Acute hypoxemic respiratory failure (HCC) 03/26/2021   Right flank pain 03/26/2021   Multiple pathological fractures 03/14/2021   Kappa light chain myeloma (HCC)    Back pain 03/11/2021   Atrial fibrillation (HCC) 03/11/2021   Compression fracture of L2 and L3 03/11/2021   Hyperlipidemia LDL goal <70     Constipation    Palliative care encounter    Actinic keratosis 02/19/2021   Inguinal hernia 02/19/2021   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   Numbness of foot 02/19/2021   Sciatica 02/19/2021   Shoulder pain 02/19/2021   Palliative care patient 02/17/2021   Closed compression fracture of body of L1 vertebra (HCC) 02/07/2021   Protein-calorie malnutrition, severe 01/31/2021   Unsteadiness on feet 01/20/2021   Chronic kidney disease, stage 3b (HCC) 01/07/2021   Collapsed vertebra, not elsewhere classified, thoracic region, subsequent encounter for fracture with routine healing 01/07/2021   Muscle weakness (generalized) 01/07/2021   Prostate hyperplasia, benign localized, without urinary obstruction 01/07/2021   Nonrheumatic mitral (valve) insufficiency 01/07/2021   Hypokalemia    Normocytic anemia    Atherosclerosis of aorta (HCC)    Irregular heart rhythm    T12 compression fracture (HCC) 01/02/2021   History of kidney stones    Acute kidney injury superimposed on CKD (HCC)    Ureteral stone    Ureteral perforation secondary to stent manipulation 10/16/2017   Abdominal pain 10/16/2017   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/26/2016   Hyperplasia, prostate 02/03/2015   Multinodular goiter 07/03/2014   Thyroid nodule 06/20/2014   Nontoxic uninodular goiter 06/20/2014   Edema 09/24/2013   Encounter for preventive health examination 08/21/2013   Personal history of colonic polyps 08/21/2013   History of venomous snake bite 08/21/2013   Cough 10/02/2012   Prostate hypertrophy    Elevated prostate specific antigen (PSA)    HYPERTRIGLYCERIDEMIA 12/21/2006   Essential hypertension 12/21/2006   GERD 12/21/2006   CALCULUS, KIDNEY 12/21/2006    PCP: Dorothey Baseman, MD   REFERRING PROVIDER: Borders, Daryl Eastern, NP   REFERRING DIAG: C90.00 (ICD-10-CM) - Kappa light chain myeloma (HCC)    Rationale for Evaluation and Treatment: Rehabilitation  THERAPY DIAG:   Muscle weakness (generalized)  Difficulty in walking, not elsewhere classified  Other low back pain  ONSET DATE: >6 months  SUBJECTIVE:  SUBJECTIVE STATEMENT: Pt reports that he is feeling tired this date. He was out last week due to fatigue.  Pain reported as 3-4/10 At start of PT treatment   PERTINENT HISTORY:  89 y.o. male with multiple medical problems including hypertension, CKD stage IIIb, A. fib on Eliquis, and anemia.  Patient was hospitalized 01/02/2021 to 01/07/2021 with T12 compression fracture with recommendation for conservative management including use of a TLSO brace.  Patient was hospitalized again 01/29/2021 to 02/07/2021 with recurrent back pain and MR showing T12 and new L1 compression fracture.  Patient underwent T12/L1 kyphoplasty on 02/02/2021.  There was also suspicion of a left fifth rib fracture, which occurred while transitioning from the stretcher for his kyphoplasty.  Patient had mild hypercalcemia with SPEP and UPEP concerning for possible myeloma.  Patient was [facility] to [facility]-up.  He was readmitted 03/11/2021 - 03/17/2021 with worsening back pain.  CT revealed new compression fractures of L2 and L3.  Patient underwent kyphoplasty on 6/30.  He was hospitalized again 03/26/2021-04/07/2021 with severe back/flank pain.  MRI revealed T10 pathologic compression fracture.  He was started on treatment for multiple myeloma with dexamethasone/Velcade and XRT to spine.   PAIN:  Are you having pain? Yes: NPRS scale: 2-3/10 Pain location:  mid to lower back R >L   Pain description: "pain" Aggravating factors: getting out of bed Relieving factors: Heat, muscle rub,     PRECAUTIONS: Back for comfort   WEIGHT BEARING RESTRICTIONS: No  FALLS:  Has patient fallen in last 6  months? Yes. [place]   LIVING ENVIRONMENT: Lives with: lives with their spouse Lives in: House/apartment Stairs: No Has following equipment at home: Single point cane  OCCUPATION: retired.   PLOF: Independent, Independent with basic ADLs, and Independent with community mobility without device  Pt reports only recent use of SPC   over the past month   PATIENT GOALS: reduce back pain and improve balance.   NEXT MD VISIT: June 2024  OBJECTIVE: (objective measures completed at initial evaluation unless otherwise dated)   DIAGNOSTIC FINDINGS:   5/72024: New MRI Pending     CLINICAL DATA:  Multiple myeloma. EXAM: NUCLEAR MEDICINE WHOLE BODY BONE SCAN IMPRESSION: No evident malignant lesion by MDP whole-body bone imaging.    CLINICAL DATA:  Mid to low back pain. History of multiple myeloma. Prior kyphoplasty. EXAM: MRI THORACIC AND LUMBAR SPINE WITHOUT CONTRAST IMPRESSION: 1. Remote compression fractures of T11, T12, L1, L2 and L3, status post vertebral augmentation. No acute abnormality of the thoracic or lumbar spine. 2. Previously described enhancing lesions at T8 and T9 are poorly characterized without contrast. There unenhanced appearance is unchanged. 3. No spinal canal or neural foraminal stenosis.    PATIENT SURVEYS:  FOTO 55  SCREENING FOR RED FLAGS: Bowel or bladder incontinence: No Spinal tumors: No Cauda equina syndrome: No Compression fracture: Yes: multiple kyphoplasty  Abdominal aneurysm: No  COGNITION: Overall cognitive status: Within functional limits for tasks assessed     SENSATION: Light touch: Impaired  and baseline neuropathy in distal BLE to mid shin  in BLE   MUSCLE LENGTH: Hamstrings: Right  deg; Left  deg Maisie Fus test: Right  deg; Left  deg bas POSTURE: rounded shoulders, forward head, and decreased lumbar lordosis  PALPATION: Sensitive to palpation on the lower R side of the back and R SI joint. Noted to  have multiple trigger point to paraspinals R>L and R side QL.   LUMBAR  ROM:   AROM eval  Flexion 10  Extension 10  Right lateral flexion 15  Left lateral flexion 15  Right rotation 40  Left rotation 45   (Blank rows = not tested)  LOWER EXTREMITY ROM:     Active  Right eval Left eval  Hip flexion WFL   Hip extension    Hip abduction    Hip adduction    Hip internal rotation    Hip external rotation    Knee flexion    Knee extension    Ankle dorsiflexion    Ankle plantarflexion    Ankle inversion    Ankle eversion     (Blank rows = not tested)  LOWER EXTREMITY MMT:    MMT Right eval Left eval  Hip flexion 4- 4-  Hip extension 4 4  Hip abduction 4+ 4+  Hip adduction 5 5  Hip internal rotation    Hip external rotation    Knee flexion 5 5  Knee extension 5 5  Ankle dorsiflexion 5 5  Ankle plantarflexion    Ankle inversion    Ankle eversion     (Blank rows = not tested)  LUMBAR SPECIAL TESTS:  Prone instability test: Negative, Slump test: Negative, and SI Compression/distraction test: Negative  FUNCTIONAL TESTS:  5 times sit to stand: 12.9 hands pushing from thights  Timed up and go (TUG): 16.93 3 minute walk test: 657ft  10 meter walk test: 0.9 m/s  Berg Balance Scale: 46/56 Functional gait assessment: 22/30  GAIT: Distance walked:  649 Assistive device utilized: Single point cane Level of assistance: Modified independence Comments: decreased hip extension or pelvic rotation   TODAY'S TREATMENT:                                                                                                                              DATE: 03/02/2023 TE  Due to fatigue standing activities broken up with seated self stretching for energy conservation purposes.   Hip abduction 2.5# ankle weight x 12 bil  Hip extension 2.5# BUE support at rail x 10 bil.  Standing on Airex beam:  SLS 2 x 15 seconds bilateral.  Tandem stance 2 x 30 seconds intermittent upper  extremity support   Seated self stretch: Single knee to chest 2 x 35 sec - min complaints of back pain noted as he is doing this but he reports this is not bothering him too much  Figure 4 piriformis stretch 2 x 35 sec  Seated HS stretch 2 x 30 sec  Ball roll out forward for thoracic extension and for post hip stretch  Seated thoracic extension with 1/2 foam roller post to pt in armchair x 10 reps x 5 sec holds    Standing self stretch  Heel cord/gastroc 2 x 30 sec on wedge/ incline Soleus stretch 2x30 seec bil on wedge/ incline  Cues for posture and BLE positioning on each bout to improve intensity of stretch  to target muscle groups.      PATIENT EDUCATION:  Education details: POC. Pt educated throughout session about proper posture and technique with exercises. Improved exercise technique, movement at target joints, use of target muscles after min to mod verbal, visual, tactile cues.  Person educated: Patient Education method: Medical illustrator Education comprehension: verbalized understanding  HOME EXERCISE PROGRAM: Access Code: [email or web address] URL: [email or web address] Date: 01/28/2023 Prepared by: Grier Rocher  Exercises - Standing Tandem Balance with Counter Support  - 1 x daily - 5 x weekly - 3 sets - 2 reps - 10 hold - Standing Single Leg Stance with Counter Support  - 1 x daily - 5 x weekly - 3 sets - 2 reps - 10 hold - Standing Hip Abduction with Counter Support  - 1 x daily - 5 x weekly - 2 sets - 10 reps - 2 hold  ASSESSMENT:  CLINICAL IMPRESSION: Continued with current plan of care as laid out in evaluation and recent prior sessions. Pt remains motivated to advance progress toward goals in order to maximize independence and safety at home. Alternated seated and standing activities to prevent eraly onset of fatigue, good results with thir strategy. Pt requires high level assistance and cuing for completion of exercises in order to provide  adequate level of stimulation challenge while minimizing pain and discomfort when possible. Pt closely monitored throughout session pt response and to maximize patient safety during interventions. Pt continues to demonstrate progress toward goals AEB progression of interventions this date either in volume or intensity.    OBJECTIVE IMPAIRMENTS: decreased balance, decreased endurance, decreased mobility, difficulty walking, decreased ROM, decreased strength, hypomobility, impaired perceived functional ability, impaired sensation, improper body mechanics, and postural dysfunction.   ACTIVITY LIMITATIONS: carrying, lifting, standing, squatting, stairs, and locomotion level  PARTICIPATION LIMITATIONS: laundry, shopping, community activity, and yard work  PERSONAL FACTORS: Age and 3+ comorbidities: CA, hypertension, CKD III  are also affecting patient's functional outcome.   REHAB POTENTIAL: Good  CLINICAL DECISION MAKING: Evolving/moderate complexity  EVALUATION COMPLEXITY: Moderate   GOALS: Goals reviewed with patient? No   SHORT TERM GOALS: Target date: 6/120/2024    Patient will be independent in home exercise program to improve strength/mobility for better functional independence with ADLs. Baseline: initiated on 01/20/2023 Goal status: INITIAL   LONG TERM GOALS: Target date: 04/15/2023    Patient will increase FOTO score to equal to or greater than 60   to demonstrate statistically significant improvement in mobility and quality of life.  Baseline: 55 Goal status: INITIAL  2.  Patient (> 40 years old) will complete five times sit to stand test in < 11 seconds indicating an increased LE strength and improved balance. Baseline: 12.9 Goal status: INITIAL  3.  Patient will increase Berg Balance score by > 6 points to demonstrate decreased fall risk during functional activities Baseline: 43 Goal status: INITIAL  4.  Patient will increase 10 meter walk test to >1.59m/s as to  improve gait speed for better community ambulation and to reduce fall risk. Baseline: 0.9 Goal status: INITIAL  5.  Patient will reduce timed up and go to <11 seconds to reduce fall risk and demonstrate improved transfer/gait ability. Baseline: 16.9sec Goal status: INITIAL  6.  Patient will increase FGA >4points as to demonstrate reduced fall risk and improved dynamic gait balance for better safety with community/home ambulation.   Baseline: 22 Goal status: INITIAL    PLAN:  PT FREQUENCY: 1-2x/week  PT DURATION: 12 weeks  PLANNED INTERVENTIONS:  Therapeutic exercises, Therapeutic activity, Neuromuscular re-education, Balance training, Gait training, Patient/Family education, Self Care, Joint mobilization, Joint manipulation, Stair training, DME instructions, Dry Needling, Spinal mobilization, Moist heat, scar mobilization, and Manual therapy.  PLAN FOR NEXT SESSION:   Balance and BLE strength training.  Improved hip mobility/stretching.  Reassess HEP and adjust as needed.   Gentle therex of low/mid back stabilization.    Norman Herrlich PT ,DPT Physical Therapist- [facility]   11:43 AM 03/02/23

## 2023-03-03 ENCOUNTER — Ambulatory Visit: Payer: PPO | Admitting: Oncology

## 2023-03-03 ENCOUNTER — Ambulatory Visit: Payer: PPO | Admitting: Pharmacist

## 2023-03-03 ENCOUNTER — Ambulatory Visit: Payer: PPO

## 2023-03-03 ENCOUNTER — Other Ambulatory Visit: Payer: PPO

## 2023-03-03 DIAGNOSIS — M5459 Other low back pain: Secondary | ICD-10-CM

## 2023-03-03 DIAGNOSIS — C9000 Multiple myeloma not having achieved remission: Secondary | ICD-10-CM | POA: Diagnosis not present

## 2023-03-03 DIAGNOSIS — J9601 Acute respiratory failure with hypoxia: Secondary | ICD-10-CM | POA: Diagnosis not present

## 2023-03-03 DIAGNOSIS — R2689 Other abnormalities of gait and mobility: Secondary | ICD-10-CM | POA: Diagnosis not present

## 2023-03-03 DIAGNOSIS — R262 Difficulty in walking, not elsewhere classified: Secondary | ICD-10-CM

## 2023-03-03 DIAGNOSIS — M6281 Muscle weakness (generalized): Secondary | ICD-10-CM

## 2023-03-03 DIAGNOSIS — S32591D Other specified fracture of right pubis, subsequent encounter for fracture with routine healing: Secondary | ICD-10-CM | POA: Diagnosis not present

## 2023-03-03 NOTE — Progress Notes (Signed)
Office Visit    Patient Name: Jack [name] Date of Encounter: 03/04/2023  Primary Care Provider:  Dorothey Baseman, MD Primary Cardiologist:  Jack Kendall, MD  Chief Complaint    89 y.o. y/o male with a history of paroxysmal atrial fibrillation, PSVT, PACs, multifocal atrial tachycardia, coronary calcium, aortic atherosclerosis, hypertension, hyperlipidemia, hypertriglyceridemia, GERD, stage III chronic kidney disease, BPH, nephrolithiasis, remote tobacco abuse, and myeloma, who presents for follow-up related to PAF.   Past Medical History    Past Medical History:  Diagnosis Date   Ascending aortic aneurysm (HCC)    a. 12/2020 Echo: Asc Ao 48mm, Ao root 40mm. b. 03/2022 Asc Ao 4.6 cm (4.5 cm in 2019)   CKD (chronic kidney disease), stage III (HCC)    Diastolic dysfunction    a. 12/2020 Echo: EF 50-55%, no rwma, mild LVH, GrI DD, nl RV fxn, mild AI. Asc Ao 48mm, Ao root 40mm.   Elevated prostate specific antigen (PSA)    has been 7 for a year    GERD (gastroesophageal reflux disease)    History of colon polyps 2008   [facility],    History of kidney stones    Hyperlipidemia    Hypertension    Myeloma (HCC)    PAF (paroxysmal atrial fibrillation) (HCC)    a. 01/2021-->Eliquis (CHA2DS2VASc = 3-4).   Pain    Prostate hypertrophy    diagnosed at age 49 due to hematospermia   Past Surgical History:  Procedure Laterality Date   CATARACT EXTRACTION W/PHACO Left 01/10/2018   Procedure: CATARACT EXTRACTION PHACO AND INTRAOCULAR LENS PLACEMENT (IOC);  Surgeon: Jack Manila, MD;  Location: [facility];  Service: Ophthalmology;  Laterality: Left;  [name] 00:24.8 AP% 14.9 CDE 3.68 Fluid pack lot # [id number]   CATARACT EXTRACTION W/PHACO Right 01/25/2018   Procedure: CATARACT EXTRACTION PHACO AND INTRAOCULAR LENS PLACEMENT (IOC);  Surgeon: Jack Manila, MD;  Location: [facility];  Service: Ophthalmology;  Laterality: Right;  [name] 00:42 AP% 10.8 CDE 4.59 Fluid pack lot #  [id number]   COLON SURGERY     CYSTOSCOPY W/ URETERAL STENT PLACEMENT Right 10/16/2017   Procedure: right  URETERAL STENT PLACEMENT,cystoscopy bilateral stent removal,rretrograde;  Surgeon: Jack Altes, MD;  Location: [facility];  Service: Urology;  Laterality: Right;   CYSTOSCOPY/URETEROSCOPY/HOLMIUM LASER/STENT PLACEMENT Right 12/16/2020   Procedure: CYSTOSCOPY/URETEROSCOPY/HOLMIUM LASER/STENT PLACEMENT;  Surgeon: Jack Altes, MD;  Location: [facility];  Service: Urology;  Laterality: Right;   EXTRACORPOREAL SHOCK WAVE LITHOTRIPSY Right 12/11/2020   Procedure: EXTRACORPOREAL SHOCK WAVE LITHOTRIPSY (ESWL);  Surgeon: Jack Altes, MD;  Location: [facility];  Service: Urology;  Laterality: Right;   IR BONE TUMOR(S)RF ABLATION  04/16/2022   IR KYPHO EA ADDL LEVEL THORACIC OR LUMBAR  02/02/2021   IR KYPHO LUMBAR INC FX REDUCE BONE BX UNI/BIL CANNULATION INC/IMAGING  02/02/2021   IR KYPHO THORACIC WITH BONE BIOPSY  04/15/2022   KIDNEY STONE SURGERY     KYPHOPLASTY N/A 03/12/2021   Procedure: Jack [name], L3;  Surgeon: Jack Bucker, MD;  Location: [facility];  Service: Orthopedics;  Laterality: N/A;   RESECTION SOFT TISSUE TUMOR LEG / ANKLE RADICAL  jan 2009   [facility],  right thigh/knee , nonmalignant   SMALL INTESTINE SURGERY  1946   implaed on picket fence, punctured stomach   TONSILLECTOMY      Allergies  Allergies  Allergen Reactions   Quinolones     Aortic dilation contraindicates FQ   Amlodipine Swelling and Other (See Comments)  LE edema   Azithromycin Nausea And Vomiting   Lipitor [Atorvastatin]     Muscle pain in RT Leg    Lisinopril Cough   Zetia [Ezetimibe]     Pain in legs     History of Present Illness      89 y.o. y/o male with the above complex past medical history including paroxysmal atrial fibrillation, PSVT, PACs, multifocal atrial tachycardia, coronary calcium, aortic atherosclerosis, ascending aortic aneurysm, hypertension, hyperlipidemia, hypertriglyceridemia,  GERD, stage III chronic kidney disease, BPH, nephrolithiasis, remote tobacco abuse, and myeloma.  He was admitted in April 2022 for right flank and mid back pain with finding of right ureteral stone requiring lithotripsy.  He was subsequently found to have fracture of T12 with plan for conservative therapy.  While hospitalized, he was noted to have intermittent tachycardia and irregular rhythm.  No evidence of A-fib was noted.  An echocardiogram showed an EF of 50 to 55% with mild LVH, grade 1 diastolic dysfunction, and dilated ascending aorta at 48 mm with an aortic root of 40 mm.  A Zio monitor was placed at discharge and this showed 165 atrial runs lasting up to 16.8 seconds at a maximum rate of 193 bpm.  There were no sustained arrhythmias or pauses.  He was readmitted in late May 2022 due to severe back pain requiring vertebroplasty.  During this admission, he did develop atrial fibrillation with rapid ventricular response but subsequently converted to sinus rhythm after about 2 hours, after amiodarone was initiated.  He was subsequently placed on Eliquis initially at 2.5 mg twice daily in the setting of elevated creatinine however, this dose has been increased to 5 mg twice daily following improvement of creatinine.  Amiodarone was subsequently discontinued.   In the setting of ascending Ao dil, CTA chest was undertaken in 03/2022, which showed 4.6 cm asc Ao aneurysm.  He subsequently followed up with CT surgery and agreed to have follow-up imaging in 1 year.   Jack [name] was last seen in cardiology clinic in December 2023, at which time he was maintaining sinus bradycardia.  Carvedilol dose was reduced to 12.5 mg twice daily.  He continues to be followed closely by oncology in the setting of myeloma w/ significant chronic back pain.  From a cardiac standpoint, he has done well.  He does not experience chest pain, dyspnea, or palpitations.  For the past several weeks, due to significant back pain and fatigue,  activity has been pretty limited.  He denies PND, orthopnea, dizziness, syncope, edema, or early satiety.  Home Medications      Prior to Admission medications   Medication Sig Start Date End Date Taking? Authorizing Provider  acetaminophen (TYLENOL) 500 MG tablet Take 500-1,000 mg by mouth every 8 (eight) hours as needed for moderate pain.   Yes [provider]  albuterol (VENTOLIN HFA) 108 (90 Base) MCG/ACT inhaler Inhale into the lungs. 10/06/22  Yes [provider]  apixaban (ELIQUIS) 5 MG TABS tablet Take 1 tablet (5 mg total) by mouth 2 (two) times daily. 01/24/23  Yes Jeralyn Ruths, MD  Ascorbic Acid (VITAMIN C) 1000 MG tablet Take 1,000 mg by mouth in the morning and at bedtime.   Yes [provider]  Calcium Carb-Cholecalciferol (OYSTER SHELL CALCIUM W/D) 500-5 MG-MCG TABS Take 1 tablet by mouth in the morning and at bedtime.   Yes [provider]  dexamethasone (DECADRON) 4 MG tablet TAKE 5 TABLETS BY MOUTH ONE TIME PER WEEK 01/06/23  Yes Finnegan,  Tollie Pizza, MD  feeding supplement (ENSURE ENLIVE / ENSURE PLUS) LIQD Take 237 mLs by mouth 3 (three) times daily between meals. Patient taking differently: Take 237 mLs by mouth daily with lunch. 04/07/21  Yes Darlin Priestly, MD  gabapentin (NEURONTIN) 300 MG capsule Take 2 capsules (600 mg total) by mouth 3 (three) times daily. 01/11/23  Yes Borders, Daryl Eastern, NP  ipratropium-albuterol (DUONEB) 0.5-2.5 (3) MG/3ML SOLN Take 3 mLs by nebulization every 4 (four) hours as needed. 11/05/22  Yes Borders, Daryl Eastern, NP  lenalidomide (REVLIMID) 20 MG capsule Take 1 capsule (20 mg total) by mouth daily. Take for 21 days, then hold for 7 days. Repeat every 28 days 03/01/23  Yes Jeralyn Ruths, MD  lidocaine (XYLOCAINE) 5 % ointment Apply topically 3 (three) times daily as needed for mild pain or moderate pain. 02/21/23  Yes Jeralyn Ruths, MD  morphine (MS CONTIN) 30 MG 12 hr tablet Take 1 tablet (30 mg total) by  mouth every 8 (eight) hours. 02/24/23  Yes Borders, Daryl Eastern, NP  Multiple Vitamin (MULTIVITAMIN WITH MINERALS) TABS tablet Take 1 tablet by mouth daily. 04/08/21  Yes Darlin Priestly, MD  Multiple Vitamins-Minerals (PRESERVISION AREDS PO) Take 1 capsule by mouth in the morning and at bedtime.   Yes [provider]  naloxone (NARCAN) nasal spray 4 mg/0.1 mL SPRAY 1 SPRAY INTO ONE NOSTRIL AS DIRECTED FOR OPIOID OVERDOSE (TURN PERSON ON SIDE AFTER DOSE. IF NO RESPONSE IN 2-3 MINUTES OR PERSON RESPONDS BUT RELAPSES, REPEAT USING A NEW SPRAY DEVICE AND SPRAY INTO THE OTHER NOSTRIL. CALL 911 AFTER USE.) * EMERGENCY USE ONLY * 03/25/21  Yes Borders, Daryl Eastern, NP  Oxycodone HCl 10 MG TABS Take 1 tablet (10 mg total) by mouth every 4 (four) hours as needed (pain). 02/21/23  Yes Jeralyn Ruths, MD  polyethylene glycol (MIRALAX / GLYCOLAX) 17 g packet Take 17 g by mouth 2 (two) times daily. 04/07/21  Yes Darlin Priestly, MD  potassium citrate (UROCIT-K) 10 MEQ (1080 MG) SR tablet TAKE 1 TABLET (10 MEQ TOTAL) BY MOUTH 3 (THREE) TIMES DAILY WITH MEALS. 08/17/22  Yes Stoioff, Verna Czech, MD  senna-docusate (SENOKOT-S) 8.6-50 MG tablet Take 1 tablet by mouth 2 (two) times daily. 03/17/21  Yes [name] A, DO  carvedilol (COREG) 12.5 MG tablet Take 0.5 tablets (6.25 mg total) by mouth 2 (two) times daily. 03/04/23   Creig Hines, NP      Review of Systems    Significant fatigue and back pain.  On multiple medications which caused him to be tired.  He denies chest pain, dyspnea, palpitations, PND, orthopnea, dizziness, syncope, edema, or early satiety.  Activity is limited..  All other systems reviewed and are otherwise negative except as noted above.    Physical Exam    VS:  BP 110/68 (BP Location: Left Arm, Patient Position: Sitting, Cuff Size: Normal)   Pulse (!) 57   Ht 5\' 7"  (1.702 m)   Wt 162 lb 4 oz (73.6 kg)   SpO2 95%   BMI 25.41 kg/m  , BMI Body mass index is 25.41 kg/m.     GEN: Thin,  somewhat frail, in no acute distress. HEENT: normal. Neck: Supple, no JVD, carotid bruits, or masses. Cardiac: RRR, no murmurs, rubs, or gallops. No clubbing, cyanosis, edema.  Radials 2+/PT 2+ and equal bilaterally.  Respiratory:  Respirations regular and unlabored, clear to auscultation bilaterally. GI: Soft, nontender, nondistended, BS + x 4. MS: no deformity  or atrophy. Skin: warm and dry, no rash. Neuro:  Strength and sensation are intact. Psych: Normal affect.  Accessory Clinical Findings    ECG personally reviewed by me today -    Sinus bradycardia, 57, left axis deviation- no acute changes.  Lab Results  Component Value Date   WBC 3.7 (L) 02/08/2023   HGB 13.0 02/08/2023   HCT 40.8 02/08/2023   MCV 101.2 (H) 02/08/2023   PLT 87 (L) 02/08/2023   Lab Results  Component Value Date   CREATININE 1.30 (H) 02/08/2023   BUN 23 02/08/2023   NA 135 02/08/2023   K 4.3 02/08/2023   CL 98 02/08/2023   CO2 27 02/08/2023   Lab Results  Component Value Date   ALT 13 02/08/2023   AST 24 02/08/2023   ALKPHOS 80 02/08/2023   BILITOT 0.6 02/08/2023   Lab Results  Component Value Date   CHOL 164 09/09/2022   HDL 40 (L) 09/09/2022   LDLCALC 87 09/09/2022   LDLDIRECT 54 02/24/2021   TRIG 185 (H) 09/09/2022   CHOLHDL 4.1 09/09/2022     Assessment & Plan    1.  Paroxysmal atrial fibrillation: Maintaining sinus rhythm on beta-blocker and Eliquis therapy.  Heart rate and blood pressure stable.  Recent stable lab work in May-followed closely by heme-onc.  Continue current dose of carvedilol and Eliquis.  2.  Essential hypertension: Stable on low dose ? blocker.  3.  Hyperlipidemia: LDL of 87 in December.  No longer on statin therapy in the setting of myalgias.  Discussed today.  Mutually agreed to holding off on resumption of statin or alternate medication.  4.  Ascending aortic aneurysm: CTA last July with a 4.6 cm ascending aortic aneurysm.  Seen by CT surgery.  Plan for  follow-up imaging later this year though patient inclines that he plans to forego imaging and would not want treatment for progressive or acute worsening of aneurysmal disease.  5.  Myeloma: Complicated by chronic pain.  Pending pain clinic evaluation.  Followed closely by heme-onc.  6.  Stage III chronic kidney disease: Creatinine stable at 1.3 in May.    7.  Disposition: Follow-up in 1 year or sooner if necessary.  Nicolasa Ducking, NP 03/04/2023, 10:03 AM

## 2023-03-03 NOTE — Therapy (Signed)
OUTPATIENT PHYSICAL THERAPY THORACOLUMBAR TREATMENT   Patient Name: Jack [name] MRN: [id number] DOB:08-27-1933, 89 y.o., male Today's Date: 03/03/2023  END OF SESSION:  PT End of Session - 03/03/23 1616     Visit Number 9    Number of Visits 24    Date for PT Re-Evaluation 04/14/23    Authorization Type [employer]    Progress Note Due on Visit 10    PT Start Time 1605    PT Stop Time 1645    PT Time Calculation (min) 40 min    Activity Tolerance Patient limited by fatigue;Patient limited by pain    Behavior During Therapy [facility] for tasks assessed/performed              Past Medical History:  Diagnosis Date   Ascending aortic aneurysm (HCC)    a. 12/2020 Echo: Asc Ao 48mm, Ao root 40mm. b. 03/2022 Asc Ao 4.6 cm (4.5 cm in 2019)   CKD (chronic kidney disease), stage III (HCC)    Diastolic dysfunction    a. 12/2020 Echo: EF 50-55%, no rwma, mild LVH, GrI DD, nl RV fxn, mild AI. Asc Ao 48mm, Ao root 40mm.   Elevated prostate specific antigen (PSA)    has been 7 for a year    GERD (gastroesophageal reflux disease)    History of colon polyps 2008   [facility],    History of kidney stones    Hyperlipidemia    Hypertension    Myeloma (HCC)    PAF (paroxysmal atrial fibrillation) (HCC)    a. 01/2021-->Eliquis (CHA2DS2VASc = 3-4).   Pain    Prostate hypertrophy    diagnosed at age 67 due to hematospermia   Past Surgical History:  Procedure Laterality Date   CATARACT EXTRACTION W/PHACO Left 01/10/2018   Procedure: CATARACT EXTRACTION PHACO AND INTRAOCULAR LENS PLACEMENT (IOC);  Surgeon: Galen Manila, MD;  Location: [facility] ORS;  Service: Ophthalmology;  Laterality: Left;  [name] 00:24.8 AP% 14.9 CDE 3.68 Fluid pack lot # [id number]   CATARACT EXTRACTION W/PHACO Right 01/25/2018   Procedure: CATARACT EXTRACTION PHACO AND INTRAOCULAR LENS PLACEMENT (IOC);  Surgeon: Galen Manila, MD;  Location: [facility] OR[facility];  Service: Ophthalmology;  Laterality: Right;  [name]  00:42 AP% 10.8 CDE 4.59 Fluid pack lot # [id number]   COLON SURGERY     CYSTOSCOPY W/ URETERAL STENT PLACEMENT Right 10/16/2017   Procedure: right  URETERAL STENT PLACEMENT,cystoscopy bilateral stent removal,rretrograde;  Surgeon: Riki Altes, MD;  Location: [facility] ORS;  Service: Urology;  Laterality: Right;   CYSTOSCOPY/URETEROSCOPY/HOLMIUM LASER/STENT PLACEMENT Right 12/16/2020   Procedure: CYSTOSCOPY/URETEROSCOPY/HOLMIUM LASER/STENT PLACEMENT;  Surgeon: Riki Altes, MD;  Location: [facility] OR[facility];  Service: Urology;  Laterality: Right;   EXTRACORPOREAL SHOCK WAVE LITHOTRIPSY Right 12/11/2020   Procedure: EXTRACORPOREAL SHOCK WAVE LITHOTRIPSY (ESWL);  Surgeon: Riki Altes, MD;  Location: [facility] OR[facility];  Service: Urology;  Laterality: Right;   IR BONE TUMOR(S)RF ABLATION  04/16/2022   IR KYPHO EA ADDL LEVEL THORACIC OR LUMBAR  02/02/2021   IR KYPHO LUMBAR INC FX REDUCE BONE BX UNI/BIL CANNULATION INC/IMAGING  02/02/2021   IR KYPHO THORACIC WITH BONE BIOPSY  04/15/2022   KIDNEY STONE SURGERY     KYPHOPLASTY N/A 03/12/2021   Procedure: Nicki Reaper, L3;  Surgeon: Kennedy Bucker, MD;  Location: [facility] OR[facility];  Service: Orthopedics;  Laterality: N/A;   RESECTION SOFT TISSUE TUMOR LEG / ANKLE RADICAL  jan 2009   [facility],  right thigh/knee , nonmalignant   SMALL INTESTINE SURGERY  1946   implaed on picket fence, punctured stomach   TONSILLECTOMY     Patient Active Problem List   Diagnosis Date Noted   Aneurysm of ascending aorta without rupture (HCC) 08/26/2022   Statin myopathy 08/26/2022   Postural dizziness with presyncope 06/11/2022   Pelvic fracture (HCC) 06/10/2022   Multiple myeloma (HCC) 05/05/2021   Compression fracture of body of thoracic vertebra (HCC)    Acute hypoxemic respiratory failure (HCC) 03/26/2021   Right flank pain 03/26/2021   Multiple pathological fractures 03/14/2021   Kappa light chain myeloma (HCC)    Back pain 03/11/2021   Atrial fibrillation (HCC) 03/11/2021   Compression  fracture of L2 and L3 03/11/2021   Hyperlipidemia LDL goal <70    Constipation    Palliative care encounter    Actinic keratosis 02/19/2021   Inguinal hernia 02/19/2021   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   Numbness of foot 02/19/2021   Sciatica 02/19/2021   Shoulder pain 02/19/2021   Palliative care patient 02/17/2021   Closed compression fracture of body of L1 vertebra (HCC) 02/07/2021   Protein-calorie malnutrition, severe 01/31/2021   Unsteadiness on feet 01/20/2021   Chronic kidney disease, stage 3b (HCC) 01/07/2021   Collapsed vertebra, not elsewhere classified, thoracic region, subsequent encounter for fracture with routine healing 01/07/2021   Muscle weakness (generalized) 01/07/2021   Prostate hyperplasia, benign localized, without urinary obstruction 01/07/2021   Nonrheumatic mitral (valve) insufficiency 01/07/2021   Hypokalemia    Normocytic anemia    Atherosclerosis of aorta (HCC)    Irregular heart rhythm    T12 compression fracture (HCC) 01/02/2021   History of kidney stones    Acute kidney injury superimposed on CKD (HCC)    Ureteral stone    Ureteral perforation secondary to stent manipulation 10/16/2017   Abdominal pain 10/16/2017   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/26/2016   Hyperplasia, prostate 02/03/2015   Multinodular goiter 07/03/2014   Thyroid nodule 06/20/2014   Nontoxic uninodular goiter 06/20/2014   Edema 09/24/2013   Encounter for preventive health examination 08/21/2013   Personal history of colonic polyps 08/21/2013   History of venomous snake bite 08/21/2013   Cough 10/02/2012   Prostate hypertrophy    Elevated prostate specific antigen (PSA)    HYPERTRIGLYCERIDEMIA 12/21/2006   Essential hypertension 12/21/2006   GERD 12/21/2006   CALCULUS, KIDNEY 12/21/2006    PCP: Dorothey Baseman, MD   REFERRING PROVIDER: Borders, Daryl Eastern, NP   REFERRING DIAG: C90.00 (ICD-10-CM) - Kappa light chain myeloma (HCC)     Rationale for Evaluation and Treatment: Rehabilitation  THERAPY DIAG:  Muscle weakness (generalized)  Difficulty in walking, not elsewhere classified  Other low back pain  ONSET DATE: >6 months  SUBJECTIVE:  SUBJECTIVE STATEMENT: Pt reports that he is feeling extra tired and fatigued today. His pain is also quite elevated.   PERTINENT HISTORY:  89 y.o. male with multiple medical problems including hypertension, CKD stage IIIb, A. fib on Eliquis, and anemia.  Patient was hospitalized 01/02/2021 to 01/07/2021 with T12 compression fracture with recommendation for conservative management including use of a TLSO brace.  Patient was hospitalized again 01/29/2021 to 02/07/2021 with recurrent back pain and MR showing T12 and new L1 compression fracture.  Patient underwent T12/L1 kyphoplasty on 02/02/2021.  There was also suspicion of a left fifth rib fracture, which occurred while transitioning from the stretcher for his kyphoplasty.  Patient had mild hypercalcemia with SPEP and UPEP concerning for possible myeloma.  Patient was referred to [facility] for work-up.  He was readmitted 03/11/2021 - 03/17/2021 with worsening back pain.  CT revealed new compression fractures of L2 and L3.  Patient underwent kyphoplasty on 6/30.  He was hospitalized again 03/26/2021-04/07/2021 with severe back/flank pain.  MRI revealed T10 pathologic compression fracture.  He was started on treatment for multiple myeloma with dexamethasone/Velcade and XRT to spine.   PAIN:  Are you having pain? Yes   PRECAUTIONS: Back for comfort, history of compression fractures   WEIGHT BEARING RESTRICTIONS: No  FALLS:  Has patient fallen in last 6 months? Yes. [place].   PATIENT GOALS: reduce back pain and improve balance.    NEXT MD VISIT:   OBJECTIVE:   TODAY'S TREATMENT:                                                                                                                              DATE: 03/03/2023   Due to fatigue standing activities broken up with seated self stretching for energy conservation purposes.  BP assessment 155/46mmHg   Seated self stretch: -red physio ball rollouts x20  -Single knee to chest 1 x 30 sec, using 12 inch step to facilitate -FABER stretch 1x30 sec bilat  -seated alternate marching 1x20 *pt asked to end session early, feeling quite poorly     PATIENT EDUCATION:  Education details: POC. Pt educated throughout session about proper posture and technique with exercises. Improved exercise technique, movement at target joints, use of target muscles after min to mod verbal, visual, tactile cues.  Person educated: Patient Education method: Medical illustrator Education comprehension: verbalized understanding  HOME EXERCISE PROGRAM: Access Code: [email or web address] URL: [email or web address] Date: 01/28/2023 Prepared by: Grier Rocher  Exercises - Standing Tandem Balance with Counter Support  - 1 x daily - 5 x weekly - 3 sets - 2 reps - 10 hold - Standing Single Leg Stance with Counter Support  - 1 x daily - 5 x weekly - 3 sets - 2 reps - 10 hold - Standing Hip Abduction with Counter Support  - 1 x daily - 5 x weekly - 2 sets - 10 reps - 2 hold  ASSESSMENT:  CLINICAL IMPRESSION: Pt reports having a good PT session previous day but unfortunately his tolerance today is quite limited, largely due to significant elevation in back pain and general fatigue. Pt puts forth big effort to partake in a limited PT session, but ultimately asks to end session early. Pt assisted out wife in waiting area, questions addressed regarding appropriateness of PT at this time- Thereasa Parkin indicated that today seemed like just a more difficult that typical day and that is to be  expected at times. Will continue to benefit from interventions moving forward.        OBJECTIVE IMPAIRMENTS: decreased balance, decreased endurance, decreased mobility, difficulty walking, decreased ROM, decreased strength, hypomobility, impaired perceived functional ability, impaired sensation, improper body mechanics, and postural dysfunction.   ACTIVITY LIMITATIONS: carrying, lifting, standing, squatting, stairs, and locomotion level  PARTICIPATION LIMITATIONS: laundry, shopping, community activity, and yard work  PERSONAL FACTORS: Age and 3+ comorbidities: CA, hypertension, CKD III  are also affecting patient's functional outcome.   REHAB POTENTIAL: Good  CLINICAL DECISION MAKING: Evolving/moderate complexity  EVALUATION COMPLEXITY: Moderate   GOALS: Goals reviewed with patient? No   SHORT TERM GOALS: Target date: 6/120/2024    Patient will be independent in home exercise program to improve strength/mobility for better functional independence with ADLs. Baseline: initiated on 01/20/2023 Goal status: INITIAL   LONG TERM GOALS: Target date: 04/15/2023    Patient will increase FOTO score to equal to or greater than 60   to demonstrate statistically significant improvement in mobility and quality of life.  Baseline: 55 Goal status: INITIAL  2.  Patient (> 68 years old) will complete five times sit to stand test in < 11 seconds indicating an increased LE strength and improved balance. Baseline: 12.9 Goal status: INITIAL  3.  Patient will increase Berg Balance score by > 6 points to demonstrate decreased fall risk during functional activities Baseline: 43 Goal status: INITIAL  4.  Patient will increase 10 meter walk test to >1.36m/s as to improve gait speed for better community ambulation and to reduce fall risk. Baseline: 0.9 Goal status: INITIAL  5.  Patient will reduce timed up and go to <11 seconds to reduce fall risk and demonstrate improved transfer/gait  ability. Baseline: 16.9sec Goal status: INITIAL  6.  Patient will increase FGA >4points as to demonstrate reduced fall risk and improved dynamic gait balance for better safety with community/home ambulation.   Baseline: 22 Goal status: INITIAL    PLAN:  PT FREQUENCY: 1-2x/week  PT DURATION: 12 weeks  PLANNED INTERVENTIONS: Therapeutic exercises, Therapeutic activity, Neuromuscular re-education, Balance training, Gait training, Patient/Family education, Self Care, Joint mobilization, Joint manipulation, Stair training, DME instructions, Dry Needling, Spinal mobilization, Moist heat, scar mobilization, and Manual therapy.  PLAN FOR NEXT SESSION:   Balance and BLE strength training.  Improved hip mobility/stretching.  Reassess HEP and adjust as needed.   Gentle therex of low/mid back stabilization.    4:19 PM, 03/03/23 Rosamaria Lints, PT, DPT Physical Therapist - [facility]  Outpatient Physical Therapy- Main Campus [phone]     4:19 PM 03/03/23

## 2023-03-04 ENCOUNTER — Ambulatory Visit: Payer: PPO | Attending: Internal Medicine | Admitting: Nurse Practitioner

## 2023-03-04 ENCOUNTER — Inpatient Hospital Stay: Payer: PPO | Attending: Oncology

## 2023-03-04 ENCOUNTER — Encounter: Payer: Self-pay | Admitting: Nurse Practitioner

## 2023-03-04 VITALS — BP 110/68 | HR 57 | Ht 67.0 in | Wt 162.2 lb

## 2023-03-04 DIAGNOSIS — I10 Essential (primary) hypertension: Secondary | ICD-10-CM | POA: Diagnosis not present

## 2023-03-04 DIAGNOSIS — C9000 Multiple myeloma not having achieved remission: Secondary | ICD-10-CM | POA: Insufficient documentation

## 2023-03-04 DIAGNOSIS — N1832 Chronic kidney disease, stage 3b: Secondary | ICD-10-CM | POA: Diagnosis not present

## 2023-03-04 DIAGNOSIS — E785 Hyperlipidemia, unspecified: Secondary | ICD-10-CM

## 2023-03-04 DIAGNOSIS — G893 Neoplasm related pain (acute) (chronic): Secondary | ICD-10-CM | POA: Insufficient documentation

## 2023-03-04 DIAGNOSIS — I480 Paroxysmal atrial fibrillation: Secondary | ICD-10-CM | POA: Diagnosis not present

## 2023-03-04 DIAGNOSIS — I4891 Unspecified atrial fibrillation: Secondary | ICD-10-CM | POA: Insufficient documentation

## 2023-03-04 DIAGNOSIS — D649 Anemia, unspecified: Secondary | ICD-10-CM | POA: Insufficient documentation

## 2023-03-04 DIAGNOSIS — G629 Polyneuropathy, unspecified: Secondary | ICD-10-CM | POA: Insufficient documentation

## 2023-03-04 DIAGNOSIS — Z7901 Long term (current) use of anticoagulants: Secondary | ICD-10-CM | POA: Insufficient documentation

## 2023-03-04 DIAGNOSIS — Z79899 Other long term (current) drug therapy: Secondary | ICD-10-CM | POA: Insufficient documentation

## 2023-03-04 DIAGNOSIS — I129 Hypertensive chronic kidney disease with stage 1 through stage 4 chronic kidney disease, or unspecified chronic kidney disease: Secondary | ICD-10-CM | POA: Insufficient documentation

## 2023-03-04 DIAGNOSIS — Z923 Personal history of irradiation: Secondary | ICD-10-CM | POA: Insufficient documentation

## 2023-03-04 DIAGNOSIS — I7121 Aneurysm of the ascending aorta, without rupture: Secondary | ICD-10-CM

## 2023-03-04 MED ORDER — CARVEDILOL 12.5 MG PO TABS: 6.2500 mg | ORAL_TABLET | Freq: Two times a day (BID) | ORAL | 3 refills | Status: DC

## 2023-03-04 NOTE — Patient Instructions (Signed)
Medication Instructions:  No changes *If you need a refill on your cardiac medications before your next appointment, please call your pharmacy*   Lab Work: None ordered If you have labs (blood work) drawn today and your tests are completely normal, you will receive your results only by: MyChart Message (if you have MyChart) OR A paper copy in the mail If you have any lab test that is abnormal or we need to change your treatment, we will call you to review the results.   Testing/Procedures: None ordered   Follow-Up: At [facility], you and your health needs are our priority.  As part of our continuing mission to provide you with exceptional heart care, we have created designated Provider Care Teams.  These Care Teams include your primary Cardiologist (physician) and Advanced Practice Providers (APPs -  Physician Assistants and Nurse Practitioners) who all work together to provide you with the care you need, when you need it.  We recommend signing up for the patient portal called "MyChart".  Sign up information is provided on this After Visit Summary.  MyChart is used to connect with patients for Virtual Visits (Telemedicine).  Patients are able to view lab/test results, encounter notes, upcoming appointments, etc.  Non-urgent messages can be sent to your provider as well.   To learn more about what you can do with MyChart, go to [email or web address].    Your next appointment:   12 month(s)  Provider:   Dr. Okey Dupre

## 2023-03-04 NOTE — Progress Notes (Signed)
Nutrition Assessment   Reason for Assessment:  Weight loss   ASSESSMENT:  89 year old male with multiple myeloma currently receiving revlimid.  Past medical history of AAA, CKD, GERD, HLD, HTN.    Spoke with patient and wife via phone for nutrition assessment.  Patient reports decreased intake/appetite for the past month.  Increased lower back pain, constipation, taste change and jaw pain/loose teeth all effecting intake.  Patient is able to drink Fairlife shake usually 1 a day.  Eating strawberries, ham and shake for lunch today.  Ate oatmeal with banana for breakfast.  Has been eating soups, rice pudding, tapcioa pudding, milkshakes, fruited jello, ice cream.  Reports constipation but currently managed with miralax and senna    Medications: prilosec, senokot, MVI, miralax, dexamethasone, vit C, calcium carbonate   Labs: glucose 194, creatinine 1.30   Anthropometrics:   Height: 67 inches Weight: 162 lb 4 oz today at [facility] doctor 173 lb on 09/20/22 BMI: 25  6% weight loss in the last 5 months   Estimated Energy Needs  Kcals: 1800-2200 Protein: 90-110 g Fluid: 1800-2200 ml   NUTRITION DIAGNOSIS: Inadequate oral intake related to cancer and related treatment side effects as evidenced by 6% weight loss in the last 5 months and decreased po intake   INTERVENTION:  Discussed ways to add calories and protein. Will mail High Calorie, High Protein handout Encouraged 350+ calorie oral nutrition supplement for added nutrition Encouraged bowel regimen to help with constipation.   Contact information will be mailed   MONITORING, EVALUATION, GOAL: weight trends, intake   Next Visit: Friday, July 19 phone call  Lille Karim B. Freida Busman, RD, LDN Registered Dietitian [phone]

## 2023-03-07 ENCOUNTER — Encounter: Payer: Self-pay | Admitting: Pharmacist

## 2023-03-07 ENCOUNTER — Inpatient Hospital Stay: Payer: PPO | Admitting: Pharmacist

## 2023-03-07 ENCOUNTER — Inpatient Hospital Stay: Payer: PPO

## 2023-03-07 VITALS — BP 140/74 | HR 56 | Temp 98.4°F | Resp 16 | Ht 67.0 in | Wt 163.5 lb

## 2023-03-07 DIAGNOSIS — Z923 Personal history of irradiation: Secondary | ICD-10-CM | POA: Diagnosis not present

## 2023-03-07 DIAGNOSIS — C9000 Multiple myeloma not having achieved remission: Secondary | ICD-10-CM

## 2023-03-07 DIAGNOSIS — Z79899 Other long term (current) drug therapy: Secondary | ICD-10-CM | POA: Diagnosis not present

## 2023-03-07 DIAGNOSIS — G629 Polyneuropathy, unspecified: Secondary | ICD-10-CM | POA: Diagnosis not present

## 2023-03-07 DIAGNOSIS — D649 Anemia, unspecified: Secondary | ICD-10-CM | POA: Diagnosis not present

## 2023-03-07 DIAGNOSIS — I129 Hypertensive chronic kidney disease with stage 1 through stage 4 chronic kidney disease, or unspecified chronic kidney disease: Secondary | ICD-10-CM | POA: Diagnosis not present

## 2023-03-07 DIAGNOSIS — M6281 Muscle weakness (generalized): Secondary | ICD-10-CM | POA: Diagnosis not present

## 2023-03-07 DIAGNOSIS — I4891 Unspecified atrial fibrillation: Secondary | ICD-10-CM | POA: Diagnosis not present

## 2023-03-07 DIAGNOSIS — G893 Neoplasm related pain (acute) (chronic): Secondary | ICD-10-CM | POA: Diagnosis not present

## 2023-03-07 DIAGNOSIS — R2689 Other abnormalities of gait and mobility: Secondary | ICD-10-CM | POA: Diagnosis not present

## 2023-03-07 DIAGNOSIS — J9601 Acute respiratory failure with hypoxia: Secondary | ICD-10-CM | POA: Diagnosis not present

## 2023-03-07 DIAGNOSIS — Z7901 Long term (current) use of anticoagulants: Secondary | ICD-10-CM | POA: Diagnosis not present

## 2023-03-07 DIAGNOSIS — S32592D Other specified fracture of left pubis, subsequent encounter for fracture with routine healing: Secondary | ICD-10-CM | POA: Diagnosis not present

## 2023-03-07 DIAGNOSIS — N1832 Chronic kidney disease, stage 3b: Secondary | ICD-10-CM | POA: Diagnosis not present

## 2023-03-07 LAB — CBC WITH DIFFERENTIAL/PLATELET
Abs Immature Granulocytes: 0.01 10*3/uL (ref 0.00–0.07)
Basophils Absolute: 0.1 10*3/uL (ref 0.0–0.1)
Basophils Relative: 2 %
Eosinophils Absolute: 0.1 10*3/uL (ref 0.0–0.5)
Eosinophils Relative: 3 %
HCT: 36 % — ABNORMAL LOW (ref 39.0–52.0)
Hemoglobin: 11.6 g/dL — ABNORMAL LOW (ref 13.0–17.0)
Immature Granulocytes: 0 %
Lymphocytes Relative: 22 %
Lymphs Abs: 0.9 10*3/uL (ref 0.7–4.0)
MCH: 32.4 pg (ref 26.0–34.0)
MCHC: 32.2 g/dL (ref 30.0–36.0)
MCV: 100.6 fL — ABNORMAL HIGH (ref 80.0–100.0)
Monocytes Absolute: 0.6 10*3/uL (ref 0.1–1.0)
Monocytes Relative: 15 %
Neutro Abs: 2.3 10*3/uL (ref 1.7–7.7)
Neutrophils Relative %: 58 %
Platelets: 87 10*3/uL — ABNORMAL LOW (ref 150–400)
RBC: 3.58 MIL/uL — ABNORMAL LOW (ref 4.22–5.81)
RDW: 15.9 % — ABNORMAL HIGH (ref 11.5–15.5)
WBC: 4 10*3/uL (ref 4.0–10.5)
nRBC: 0 % (ref 0.0–0.2)

## 2023-03-07 LAB — COMPREHENSIVE METABOLIC PANEL
ALT: 17 U/L (ref 0–44)
AST: 21 U/L (ref 15–41)
Albumin: 3.3 g/dL — ABNORMAL LOW (ref 3.5–5.0)
Alkaline Phosphatase: 84 U/L (ref 38–126)
Anion gap: 9 (ref 5–15)
BUN: 21 mg/dL (ref 8–23)
CO2: 26 mmol/L (ref 22–32)
Calcium: 8.8 mg/dL — ABNORMAL LOW (ref 8.9–10.3)
Chloride: 101 mmol/L (ref 98–111)
Creatinine, Ser: 1.23 mg/dL (ref 0.61–1.24)
GFR, Estimated: 57 mL/min — ABNORMAL LOW (ref >60)
Glucose, Bld: 169 mg/dL — ABNORMAL HIGH (ref 70–99)
Potassium: 4.1 mmol/L (ref 3.5–5.1)
Sodium: 136 mmol/L (ref 135–145)
Total Bilirubin: 0.4 mg/dL (ref 0.3–1.2)
Total Protein: 6.1 g/dL — ABNORMAL LOW (ref 6.5–8.1)

## 2023-03-07 NOTE — Progress Notes (Signed)
Oral Chemotherapy Clinic [facility]  Telephone:([phone] Fax:([phone]  Patient Care Team: Jack Baseman, MD as PCP - General (Family Medicine) [name], Jack Deer, MD as PCP - Cardiology (Cardiology) Jack Cornwall, MD (Endocrinology) Jack Ruths, MD as Consulting Physician (Hematology and Oncology) Kemper Durie, RN as [facility] Care Management   Name of the patient: Jack [name]  [id number]  [birth year]   Date of visit: 03/07/23  HPI: Patient is a 89 y.o. male with newly diagnosed multiple myeloma. Currently treated with Revlimid (lenalidomide) and dexamethasone. Patient was initiated on an all oral regimen because he was unable to physically make it into clinic at the time treatment due to his disease/performance status. His status improved he was able to come back to inperson appts on 07/14/21.  Reason for Consult: Oral chemotherapy follow-up for lenalidomide therapy.   PAST MEDICAL HISTORY: Past Medical History:  Diagnosis Date   Ascending aortic aneurysm (HCC)    a. 12/2020 Echo: Asc Ao 48mm, Ao root 40mm. b. 03/2022 Asc Ao 4.6 cm (4.5 cm in 2019)   CKD (chronic kidney disease), stage III (HCC)    Diastolic dysfunction    a. 12/2020 Echo: EF 50-55%, no rwma, mild LVH, GrI DD, nl RV fxn, mild AI. Asc Ao 48mm, Ao root 40mm.   Elevated prostate specific antigen (PSA)    has been 7 for a year    GERD (gastroesophageal reflux disease)    History of colon polyps 2008   [facility],    History of kidney stones    Hyperlipidemia    Hypertension    Myeloma (HCC)    PAF (paroxysmal atrial fibrillation) (HCC)    a. 01/2021-->Eliquis (CHA2DS2VASc = 3-4).   Pain    Prostate hypertrophy    diagnosed at age 68 due to hematospermia    HEMATOLOGY/ONCOLOGY HISTORY:  Oncology History  Kappa light chain myeloma (HCC)  03/12/2021 Initial Diagnosis   Kappa light chain myeloma (HCC)   03/27/2021 - 04/06/2021 Chemotherapy   Patient  is on Treatment Plan : MYELOMA NON-TRANSPLANT CANDIDATES VRd SQ q21d      04/16/2021 Cancer Staging   Staging form: Plasma Cell Myeloma and Plasma Cell Disorders, AJCC 8th Edition - Clinical stage from 04/16/2021: Albumin (g/dL): 3.8, ISS: Stage II, High-risk cytogenetics: Absent, LDH: Unknown - Signed by Jack Ruths, MD on 04/16/2021 Albumin range (g/dL): Greater than or equal to 3.5 Cytogenetics: t(11;14) translocation Serum calcium level: Normal Serum creatinine level: Normal Bone disease on imaging: Present     ALLERGIES:  is allergic to quinolones, amlodipine, azithromycin, lipitor [atorvastatin], lisinopril, and zetia [ezetimibe].  MEDICATIONS:  Current Outpatient Medications  Medication Sig Dispense Refill   acetaminophen (TYLENOL) 500 MG tablet Take 500-1,000 mg by mouth every 8 (eight) hours as needed for moderate pain.     albuterol (VENTOLIN HFA) 108 (90 Base) MCG/ACT inhaler Inhale into the lungs.     apixaban (ELIQUIS) 5 MG TABS tablet Take 1 tablet (5 mg total) by mouth 2 (two) times daily. 180 tablet 0   Ascorbic Acid (VITAMIN C) 1000 MG tablet Take 1,000 mg by mouth in the morning and at bedtime.     Calcium Carb-Cholecalciferol (OYSTER SHELL CALCIUM W/D) 500-5 MG-MCG TABS Take 1 tablet by mouth in the morning and at bedtime.     carvedilol (COREG) 12.5 MG tablet Take 0.5 tablets (6.25 mg total) by mouth 2 (two) times daily. 180 tablet 3   dexamethasone (DECADRON) 4 MG tablet TAKE 5 TABLETS  BY MOUTH ONE TIME PER WEEK 20 tablet 4   feeding supplement (ENSURE ENLIVE / ENSURE PLUS) LIQD Take 237 mLs by mouth 3 (three) times daily between meals. (Patient taking differently: Take 237 mLs by mouth daily with lunch.) 237 mL 12   gabapentin (NEURONTIN) 300 MG capsule Take 2 capsules (600 mg total) by mouth 3 (three) times daily. 180 capsule 2   ipratropium-albuterol (DUONEB) 0.5-2.5 (3) MG/3ML SOLN Take 3 mLs by nebulization every 4 (four) hours as needed. 360 mL 0    lenalidomide (REVLIMID) 20 MG capsule Take 1 capsule (20 mg total) by mouth daily. Take for 21 days, then hold for 7 days. Repeat every 28 days 21 capsule 0   lidocaine (XYLOCAINE) 5 % ointment Apply topically 3 (three) times daily as needed for mild pain or moderate pain. 50 g 2   morphine (MS CONTIN) 30 MG 12 hr tablet Take 1 tablet (30 mg total) by mouth every 8 (eight) hours. 90 tablet 0   Multiple Vitamin (MULTIVITAMIN WITH MINERALS) TABS tablet Take 1 tablet by mouth daily.     Multiple Vitamins-Minerals (PRESERVISION AREDS PO) Take 1 capsule by mouth in the morning and at bedtime.     naloxone (NARCAN) nasal spray 4 mg/0.1 mL SPRAY 1 SPRAY INTO ONE NOSTRIL AS DIRECTED FOR OPIOID OVERDOSE (TURN PERSON ON SIDE AFTER DOSE. IF NO RESPONSE IN 2-3 MINUTES OR PERSON RESPONDS BUT RELAPSES, REPEAT USING A NEW SPRAY DEVICE AND SPRAY INTO THE OTHER NOSTRIL. CALL 911 AFTER USE.) * EMERGENCY USE ONLY * 1 each 0   Oxycodone HCl 10 MG TABS Take 1 tablet (10 mg total) by mouth every 4 (four) hours as needed (pain). 90 tablet 0   polyethylene glycol (MIRALAX / GLYCOLAX) 17 g packet Take 17 g by mouth 2 (two) times daily.  0   potassium citrate (UROCIT-K) 10 MEQ (1080 MG) SR tablet TAKE 1 TABLET (10 MEQ TOTAL) BY MOUTH 3 (THREE) TIMES DAILY WITH MEALS. 270 tablet 2   senna-docusate (SENOKOT-S) 8.6-50 MG tablet Take 1 tablet by mouth 2 (two) times daily. 30 tablet 0   No current facility-administered medications for this visit.    VITAL SIGNS: There were no vitals taken for this visit. There were no vitals filed for this visit.  Estimated body mass index is 25.41 kg/m as calculated from the following:   Height as of 03/04/23: 5\' 7"  (1.702 m).   Weight as of 03/04/23: 73.6 kg (162 lb 4 oz).  LABS: CBC:    Component Value Date/Time   WBC 3.7 (L) 02/08/2023 0908   HGB 13.0 02/08/2023 0908   HGB 11.0 (L) 02/24/2021 1014   HCT 40.8 02/08/2023 0908   HCT 35.3 (L) 02/24/2021 1014   PLT 87 (L) 02/08/2023  0908   PLT 249 02/24/2021 1014   MCV 101.2 (H) 02/08/2023 0908   MCV 96 02/24/2021 1014   MCV 90 01/24/2013 0819   NEUTROABS 2.2 02/08/2023 0908   NEUTROABS 7.4 (H) 01/24/2013 0819   LYMPHSABS 0.8 02/08/2023 0908   LYMPHSABS 1.2 01/24/2013 0819   MONOABS 0.6 02/08/2023 0908   MONOABS 0.3 01/24/2013 0819   EOSABS 0.1 02/08/2023 0908   EOSABS 0.0 01/24/2013 0819   BASOSABS 0.1 02/08/2023 0908   BASOSABS 0.0 01/24/2013 0819   Comprehensive Metabolic Panel:    Component Value Date/Time   NA 135 02/08/2023 0908   NA 136 02/24/2021 1014   NA 138 01/24/2013 0819   K 4.3 02/08/2023 0908   K  3.8 01/24/2013 0819   CL 98 02/08/2023 0908   CL 105 01/24/2013 0819   CO2 27 02/08/2023 0908   CO2 25 01/24/2013 0819   BUN 23 02/08/2023 0908   BUN 23 02/24/2021 1014   BUN 20 (H) 01/24/2013 0819   CREATININE 1.30 (H) 02/08/2023 0908   CREATININE 1.52 (H) 01/24/2013 0819   GLUCOSE 194 (H) 02/08/2023 0908   GLUCOSE 171 (H) 01/24/2013 0819   CALCIUM 9.1 02/08/2023 0908   CALCIUM 10.4 (H) 02/19/2021 1229   AST 24 02/08/2023 0908   AST 18 01/24/2013 0819   ALT 13 02/08/2023 0908   ALT 15 01/24/2013 0819   ALKPHOS 80 02/08/2023 0908   ALKPHOS 74 01/24/2013 0819   BILITOT 0.6 02/08/2023 0908   BILITOT 0.4 02/24/2021 1014   BILITOT 0.5 01/24/2013 0819   PROT 6.7 02/08/2023 0908   PROT 6.2 02/24/2021 1014   PROT 6.3 (L) 01/24/2013 0819   ALBUMIN 3.8 02/08/2023 0908   ALBUMIN 4.5 02/24/2021 1014   ALBUMIN 3.6 01/24/2013 0819     Present during today's visit: Patient and his wife Jack [name]  Assessment and Plan: CBC/CMP reviewed with patient, okay to continue with lenalidomide 20mg , 21 days on/7off and dexamethasone 20mg  weekly Light chains pending will continue to monitor trending in kappa Xgeva still on hold while patient is evaluated by dentist, patient still reporting front lower jaw pain, using tylenol prn Mrs. Abebe reports that Mr. Grimsley went to the dentist immediately following  office visit on 5/28, imagining was completed by the dentist but she was not with him and is unable to report findings Mrs. Sazama will go back during the visit he has this week and report back to our office She knows that the Rivka Barbara will remain on hold until dental clearance is received Pain: patient has an appt with pain clinic on 03/16/23 Palliative care: patient has a f/u appt with Josh later this week. Mrs. Spadaccini reports patient saying "just let me go" several times over the last month and is requesting more palliative care services. Concerns shared verbally with Sharia Reeve, NP after today's appt so they can be addressed on Friday. Overall patient appears more fatigued during today's visit, will continue to monitor   Oral Chemotherapy Adherence: no reported missed doses.  No patient barriers to medication adherence identified.   New medications: none reported  Medication Access Issues: no issues, fills Revlimid at [employer]  Patient expressed understanding and was in agreement with this plan. He also understands that He can call clinic at any time with any questions, concerns, or complaints.   Follow-up plan: RTC in 4 weeks  Thank you for allowing me to participate in the care of this very pleasant patient.   Time Total: 15 mins  Visit consisted of counseling and education on dealing with issues of symptom management in the setting of serious and potentially life-threatening illness.Greater than 50%  of this time was spent counseling and coordinating care related to the above assessment and plan.  Signed by: Remi Haggard, PharmD, BCPS, Nolon Bussing, CPP Hematology/Oncology Clinical Pharmacist Practitioner [place]/DB/AP Oral Chemotherapy Navigation Clinic [phone]  03/07/2023 9:00 AM

## 2023-03-08 ENCOUNTER — Ambulatory Visit: Payer: PPO | Admitting: Physical Therapy

## 2023-03-08 ENCOUNTER — Ambulatory Visit: Payer: PPO | Admitting: Pharmacist

## 2023-03-08 ENCOUNTER — Other Ambulatory Visit: Payer: PPO

## 2023-03-08 DIAGNOSIS — M6281 Muscle weakness (generalized): Secondary | ICD-10-CM | POA: Diagnosis not present

## 2023-03-08 DIAGNOSIS — R262 Difficulty in walking, not elsewhere classified: Secondary | ICD-10-CM

## 2023-03-08 DIAGNOSIS — M5459 Other low back pain: Secondary | ICD-10-CM

## 2023-03-08 LAB — KAPPA/LAMBDA LIGHT CHAINS
Kappa free light chain: 131 mg/L — ABNORMAL HIGH (ref 3.3–19.4)
Kappa, lambda light chain ratio: 6.75 — ABNORMAL HIGH (ref 0.26–1.65)
Lambda free light chains: 19.4 mg/L (ref 5.7–26.3)

## 2023-03-08 NOTE — Therapy (Signed)
OUTPATIENT PHYSICAL THERAPY THORACOLUMBAR TREATMENT/ PHYSICAL THERAPY PROGRESS NOTE   Dates of reporting period  01/20/23   to   03/08/2023     Patient Name: Jack [name] MRN: [id number] DOB:November 10, 1932, 89 y.o., male Today's Date: 03/08/2023  END OF SESSION:  PT End of Session - 03/08/23 1611     Visit Number 10    Number of Visits 24    Date for PT Re-Evaluation 04/14/23    Authorization Type [employer]    Progress Note Due on Visit 10    PT Start Time 1605    PT Stop Time 1650    PT Time Calculation (min) 45 min    Activity Tolerance Patient limited by fatigue;Patient limited by pain    Behavior During Therapy [facility] for tasks assessed/performed              Past Medical History:  Diagnosis Date   Ascending aortic aneurysm (HCC)    a. 12/2020 Echo: Asc Ao 48mm, Ao root 40mm. b. 03/2022 Asc Ao 4.6 cm (4.5 cm in 2019)   CKD (chronic kidney disease), stage III (HCC)    Diastolic dysfunction    a. 12/2020 Echo: EF 50-55%, no rwma, mild LVH, GrI DD, nl RV fxn, mild AI. Asc Ao 48mm, Ao root 40mm.   Elevated prostate specific antigen (PSA)    has been 7 for a year    GERD (gastroesophageal reflux disease)    History of colon polyps 2008   [facility],    History of kidney stones    Hyperlipidemia    Hypertension    Myeloma (HCC)    PAF (paroxysmal atrial fibrillation) (HCC)    a. 01/2021-->Eliquis (CHA2DS2VASc = 3-4).   Pain    Prostate hypertrophy    diagnosed at age 11 due to hematospermia   Past Surgical History:  Procedure Laterality Date   CATARACT EXTRACTION W/PHACO Left 01/10/2018   Procedure: CATARACT EXTRACTION PHACO AND INTRAOCULAR LENS PLACEMENT (IOC);  Surgeon: Galen Manila, MD;  Location: [facility] ORS;  Service: Ophthalmology;  Laterality: Left;  [name] 00:24.8 AP% 14.9 CDE 3.68 Fluid pack lot # [id number]   CATARACT EXTRACTION W/PHACO Right 01/25/2018   Procedure: CATARACT EXTRACTION PHACO AND INTRAOCULAR LENS PLACEMENT (IOC);  Surgeon:  Galen Manila, MD;  Location: [facility] ORS;  Service: Ophthalmology;  Laterality: Right;  [name] 00:42 AP% 10.8 CDE 4.59 Fluid pack lot # [id number]   COLON SURGERY     CYSTOSCOPY W/ URETERAL STENT PLACEMENT Right 10/16/2017   Procedure: right  URETERAL STENT PLACEMENT,cystoscopy bilateral stent removal,rretrograde;  Surgeon: Riki Altes, MD;  Location: [facility] ORS;  Service: Urology;  Laterality: Right;   CYSTOSCOPY/URETEROSCOPY/HOLMIUM LASER/STENT PLACEMENT Right 12/16/2020   Procedure: CYSTOSCOPY/URETEROSCOPY/HOLMIUM LASER/STENT PLACEMENT;  Surgeon: Riki Altes, MD;  Location: [facility] ORS;  Service: Urology;  Laterality: Right;   EXTRACORPOREAL SHOCK WAVE LITHOTRIPSY Right 12/11/2020   Procedure: EXTRACORPOREAL SHOCK WAVE LITHOTRIPSY (ESWL);  Surgeon: Riki Altes, MD;  Location: [facility] ORS;  Service: Urology;  Laterality: Right;   IR BONE TUMOR(S)RF ABLATION  04/16/2022   IR KYPHO EA ADDL LEVEL THORACIC OR LUMBAR  02/02/2021   IR KYPHO LUMBAR INC FX REDUCE BONE BX UNI/BIL CANNULATION INC/IMAGING  02/02/2021   IR KYPHO THORACIC WITH BONE BIOPSY  04/15/2022   KIDNEY STONE SURGERY     KYPHOPLASTY N/A 03/12/2021   Procedure: Nicki Reaper, L3;  Surgeon: Kennedy Bucker, MD;  Location: [facility] ORS;  Service: Orthopedics;  Laterality: N/A;   RESECTION SOFT TISSUE TUMOR LEG /  ANKLE RADICAL  jan 2009   [facility],  right thigh/knee , nonmalignant   SMALL INTESTINE SURGERY  1946   implaed on picket fence, punctured stomach   TONSILLECTOMY     Patient Active Problem List   Diagnosis Date Noted   Aneurysm of ascending aorta without rupture (HCC) 08/26/2022   Statin myopathy 08/26/2022   Postural dizziness with presyncope 06/11/2022   Pelvic fracture (HCC) 06/10/2022   Multiple myeloma (HCC) 05/05/2021   Compression fracture of body of thoracic vertebra (HCC)    Acute hypoxemic respiratory failure (HCC) 03/26/2021   Right flank pain 03/26/2021   Multiple pathological fractures 03/14/2021   Kappa light chain  myeloma (HCC)    Back pain 03/11/2021   Atrial fibrillation (HCC) 03/11/2021   Compression fracture of L2 and L3 03/11/2021   Hyperlipidemia LDL goal <70    Constipation    Palliative care encounter    Actinic keratosis 02/19/2021   Inguinal hernia 02/19/2021   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   Numbness of foot 02/19/2021   Sciatica 02/19/2021   Shoulder pain 02/19/2021   Palliative care patient 02/17/2021   Closed compression fracture of body of L1 vertebra (HCC) 02/07/2021   Protein-calorie malnutrition, severe 01/31/2021   Unsteadiness on feet 01/20/2021   Chronic kidney disease, stage 3b (HCC) 01/07/2021   Collapsed vertebra, not elsewhere classified, thoracic region, subsequent encounter for fracture with routine healing 01/07/2021   Muscle weakness (generalized) 01/07/2021   Prostate hyperplasia, benign localized, without urinary obstruction 01/07/2021   Nonrheumatic mitral (valve) insufficiency 01/07/2021   Hypokalemia    Normocytic anemia    Atherosclerosis of aorta (HCC)    Irregular heart rhythm    T12 compression fracture (HCC) 01/02/2021   History of kidney stones    Acute kidney injury superimposed on CKD (HCC)    Ureteral stone    Ureteral perforation secondary to stent manipulation 10/16/2017   Abdominal pain 10/16/2017   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/26/2016   Hyperplasia, prostate 02/03/2015   Multinodular goiter 07/03/2014   Thyroid nodule 06/20/2014   Nontoxic uninodular goiter 06/20/2014   Edema 09/24/2013   Encounter for preventive health examination 08/21/2013   Personal history of colonic polyps 08/21/2013   History of venomous snake bite 08/21/2013   Cough 10/02/2012   Prostate hypertrophy    Elevated prostate specific antigen (PSA)    HYPERTRIGLYCERIDEMIA 12/21/2006   Essential hypertension 12/21/2006   GERD 12/21/2006   CALCULUS, KIDNEY 12/21/2006    PCP: Dorothey Baseman, MD   REFERRING PROVIDER: Borders, Daryl Eastern, NP   REFERRING DIAG: C90.00 (ICD-10-CM) - Kappa light chain myeloma (HCC)    Rationale for Evaluation and Treatment: Rehabilitation  THERAPY DIAG:  Muscle weakness (generalized)  Difficulty in walking, not elsewhere classified  Other low back pain  ONSET DATE: >6 months  SUBJECTIVE:  SUBJECTIVE STATEMENT: Pt reports that he is feeling extra tired and fatigued today. His pain is also quite elevated.   PERTINENT HISTORY:  89 y.o. male with multiple medical problems including hypertension, CKD stage IIIb, A. fib on Eliquis, and anemia.  Patient was hospitalized 01/02/2021 to 01/07/2021 with T12 compression fracture with recommendation for conservative management including use of a TLSO brace.  Patient was hospitalized again 01/29/2021 to 02/07/2021 with recurrent back pain and MR showing T12 and new L1 compression fracture.  Patient underwent T12/L1 kyphoplasty on 02/02/2021.  There was also suspicion of a left fifth rib fracture, which occurred while transitioning from the stretcher for his kyphoplasty.  Patient had mild hypercalcemia with SPEP and UPEP concerning for possible myeloma.  Patient was referred to [facility] for work-up.  He was readmitted 03/11/2021 - 03/17/2021 with worsening back pain.  CT revealed new compression fractures of L2 and L3.  Patient underwent kyphoplasty on 6/30.  He was hospitalized again 03/26/2021-04/07/2021 with severe back/flank pain.  MRI revealed T10 pathologic compression fracture.  He was started on treatment for multiple myeloma with dexamethasone/Velcade and XRT to spine.   PAIN:  Are you having pain? Yes   PRECAUTIONS: Back for comfort, history of compression fractures   WEIGHT BEARING RESTRICTIONS: No  FALLS:  Has patient fallen in last 6 months? Yes. [place]   PATIENT GOALS: reduce back pain and improve balance.   NEXT MD VISIT:   OBJECTIVE:   TODAY'S TREATMENT:                                                                                                                              DATE: 03/08/2023   PT instructed pt in progress note assessment to measure progress toward goals. See below for details.    PATIENT EDUCATION:  Education details: POC. Pt educated throughout session about proper posture and technique with exercises. Improved exercise technique, movement at target joints, use of target muscles after min to mod verbal, visual, tactile cues.  Person educated: Patient Education method: Medical illustrator Education comprehension: verbalized understanding  HOME EXERCISE PROGRAM: Access Code: [email or web address] URL: [email or web address] Date: 01/28/2023 Prepared by: Grier Rocher  Exercises - Standing Tandem Balance with Counter Support  - 1 x daily - 5 x weekly - 3 sets - 2 reps - 10 hold - Standing Single Leg Stance with Counter Support  - 1 x daily - 5 x weekly - 3 sets - 2 reps - 10 hold - Standing Hip Abduction with Counter Support  - 1 x daily - 5 x weekly - 2 sets - 10 reps - 2 hold  ASSESSMENT:  CLINICAL IMPRESSION: Pt reports having a good PT session previous day but unfortunately his tolerance today is quite limited, largely due to significant elevation in back pain and general fatigue. Pt puts forth big effort to partake in a limited PT session, but ultimately asks to  end session early. Pt assisted out wife in waiting area, questions addressed regarding appropriateness of PT at this time- Thereasa Parkin indicated that today seemed like just a more difficult that typical day and that is to be expected at times. Will continue to benefit from interventions moving forward.        OBJECTIVE IMPAIRMENTS: decreased balance, decreased endurance, decreased mobility, difficulty walking,  decreased ROM, decreased strength, hypomobility, impaired perceived functional ability, impaired sensation, improper body mechanics, and postural dysfunction.   ACTIVITY LIMITATIONS: carrying, lifting, standing, squatting, stairs, and locomotion level  PARTICIPATION LIMITATIONS: laundry, shopping, community activity, and yard work  PERSONAL FACTORS: Age and 3+ comorbidities: CA, hypertension, CKD III  are also affecting patient's functional outcome.   REHAB POTENTIAL: Good  CLINICAL DECISION MAKING: Evolving/moderate complexity  EVALUATION COMPLEXITY: Moderate   GOALS: Goals reviewed with patient? No   SHORT TERM GOALS: Target date: 6/120/2024    Patient will be independent in home exercise program to improve strength/mobility for better functional independence with ADLs. Baseline: initiated on 01/20/2023 Goal status: INITIAL   LONG TERM GOALS: Target date: 04/15/2023    Patient will increase FOTO score to equal to or greater than 60   to demonstrate statistically significant improvement in mobility and quality of life.  Baseline: 55 . 6/25: 53  Goal status: IN PROGRESS  2.  Patient (> 11 years old) will complete five times sit to stand test in < 11 seconds indicating an increased LE strength and improved balance. Baseline: 12.9. 6/25: with UE support 13.99 without UE support Goal status: IN PROGRESS  3.  Patient will increase Berg Balance score by > 6 points to demonstrate decreased fall risk during functional activities Baseline: 43 6/25: 47  Goal status: IN PROGRESS  4.  Patient will increase 10 meter walk test to >1.42m/s as to improve gait speed for better community ambulation and to reduce fall risk. Baseline: 0.70m/s. 6/25 1.69m/s (9.5 sec)  Goal status: MET  5.  Patient will reduce timed up and go to <11 seconds to reduce fall risk and demonstrate improved transfer/gait ability. Baseline: 16.9sec, 6/25 11.17sec  Goal status: IN PROGRESS  6.  Patient will increase  FGA >4points as to demonstrate reduced fall risk and improved dynamic gait balance for better safety with community/home ambulation.   Baseline: 22, 6/25: 25 Goal status: IN PROGRESS    PLAN:  PT FREQUENCY: 1-2x/week  PT DURATION: 12 weeks  PLANNED INTERVENTIONS: Therapeutic exercises, Therapeutic activity, Neuromuscular re-education, Balance training, Gait training, Patient/Family education, Self Care, Joint mobilization, Joint manipulation, Stair training, DME instructions, Dry Needling, Spinal mobilization, Moist heat, scar mobilization, and Manual therapy.  PLAN FOR NEXT SESSION:   Balance and BLE strength training.  Improved hip mobility/stretching.  Reassess HEP and adjust as needed.   Gentle therex of low/mid back stabilization.   Grier Rocher PT, DPT  Physical Therapist - [place]  [facility]  5:17 PM 03/08/23

## 2023-03-10 ENCOUNTER — Ambulatory Visit: Payer: PPO | Admitting: Physical Therapy

## 2023-03-10 DIAGNOSIS — M6281 Muscle weakness (generalized): Secondary | ICD-10-CM

## 2023-03-10 DIAGNOSIS — R262 Difficulty in walking, not elsewhere classified: Secondary | ICD-10-CM

## 2023-03-10 DIAGNOSIS — M5459 Other low back pain: Secondary | ICD-10-CM

## 2023-03-10 NOTE — Therapy (Signed)
OUTPATIENT PHYSICAL THERAPY THORACOLUMBAR TREATMENT/   Patient Name: Jack [name] MRN: [id number] DOB:1932/10/28, 89 y.o., male Today's Date: 03/10/2023  END OF SESSION:  PT End of Session - 03/10/23 1701     Visit Number 11    Number of Visits 24    Date for PT Re-Evaluation 04/14/23    Authorization Type [employer]    Progress Note Due on Visit 10    PT Start Time 1603    PT Stop Time 1642    PT Time Calculation (min) 39 min    Activity Tolerance Patient limited by fatigue;Patient limited by pain    Behavior During Therapy [facility] for tasks assessed/performed              Past Medical History:  Diagnosis Date   Ascending aortic aneurysm (HCC)    a. 12/2020 Echo: Asc Ao 48mm, Ao root 40mm. b. 03/2022 Asc Ao 4.6 cm (4.5 cm in 2019)   CKD (chronic kidney disease), stage III (HCC)    Diastolic dysfunction    a. 12/2020 Echo: EF 50-55%, no rwma, mild LVH, GrI DD, nl RV fxn, mild AI. Asc Ao 48mm, Ao root 40mm.   Elevated prostate specific antigen (PSA)    has been 7 for a year    GERD (gastroesophageal reflux disease)    History of colon polyps 2008   [facility],    History of kidney stones    Hyperlipidemia    Hypertension    Myeloma (HCC)    PAF (paroxysmal atrial fibrillation) (HCC)    a. 01/2021-->Eliquis (CHA2DS2VASc = 3-4).   Pain    Prostate hypertrophy    diagnosed at age 76 due to hematospermia   Past Surgical History:  Procedure Laterality Date   CATARACT EXTRACTION W/PHACO Left 01/10/2018   Procedure: CATARACT EXTRACTION PHACO AND INTRAOCULAR LENS PLACEMENT (IOC);  Surgeon: Galen Manila, MD;  Location: [facility] ORS;  Service: Ophthalmology;  Laterality: Left;  [name] 00:24.8 AP% 14.9 CDE 3.68 Fluid pack lot # [id number]   CATARACT EXTRACTION W/PHACO Right 01/25/2018   Procedure: CATARACT EXTRACTION PHACO AND INTRAOCULAR LENS PLACEMENT (IOC);  Surgeon: Galen Manila, MD;  Location: [facility] ORS;  Service: Ophthalmology;  Laterality: Right;  [name]  00:42 AP% 10.8 CDE 4.59 Fluid pack lot # [id number]   COLON SURGERY     CYSTOSCOPY W/ URETERAL STENT PLACEMENT Right 10/16/2017   Procedure: right  URETERAL STENT PLACEMENT,cystoscopy bilateral stent removal,rretrograde;  Surgeon: Riki Altes, MD;  Location: [facility] ORS;  Service: Urology;  Laterality: Right;   CYSTOSCOPY/URETEROSCOPY/HOLMIUM LASER/STENT PLACEMENT Right 12/16/2020   Procedure: CYSTOSCOPY/URETEROSCOPY/HOLMIUM LASER/STENT PLACEMENT;  Surgeon: Riki Altes, MD;  Location: [facility] ORS;  Service: Urology;  Laterality: Right;   EXTRACORPOREAL SHOCK WAVE LITHOTRIPSY Right 12/11/2020   Procedure: EXTRACORPOREAL SHOCK WAVE LITHOTRIPSY (ESWL);  Surgeon: Riki Altes, MD;  Location: [facility] ORS;  Service: Urology;  Laterality: Right;   IR BONE TUMOR(S)RF ABLATION  04/16/2022   IR KYPHO EA ADDL LEVEL THORACIC OR LUMBAR  02/02/2021   IR KYPHO LUMBAR INC FX REDUCE BONE BX UNI/BIL CANNULATION INC/IMAGING  02/02/2021   IR KYPHO THORACIC WITH BONE BIOPSY  04/15/2022   KIDNEY STONE SURGERY     KYPHOPLASTY N/A 03/12/2021   Procedure: Nicki Reaper, L3;  Surgeon: Kennedy Bucker, MD;  Location: [facility] ORS;  Service: Orthopedics;  Laterality: N/A;   RESECTION SOFT TISSUE TUMOR LEG / ANKLE RADICAL  jan 2009   [facility],  right thigh/knee , nonmalignant   SMALL INTESTINE SURGERY  1946   implaed on picket fence, punctured stomach   TONSILLECTOMY     Patient Active Problem List   Diagnosis Date Noted   Aneurysm of ascending aorta without rupture (HCC) 08/26/2022   Statin myopathy 08/26/2022   Postural dizziness with presyncope 06/11/2022   Pelvic fracture (HCC) 06/10/2022   Multiple myeloma (HCC) 05/05/2021   Compression fracture of body of thoracic vertebra (HCC)    Acute hypoxemic respiratory failure (HCC) 03/26/2021   Right flank pain 03/26/2021   Multiple pathological fractures 03/14/2021   Kappa light chain myeloma (HCC)    Back pain 03/11/2021   Atrial fibrillation (HCC) 03/11/2021   Compression  fracture of L2 and L3 03/11/2021   Hyperlipidemia LDL goal <70    Constipation    Palliative care encounter    Actinic keratosis 02/19/2021   Inguinal hernia 02/19/2021   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   Numbness of foot 02/19/2021   Sciatica 02/19/2021   Shoulder pain 02/19/2021   Palliative care patient 02/17/2021   Closed compression fracture of body of L1 vertebra (HCC) 02/07/2021   Protein-calorie malnutrition, severe 01/31/2021   Unsteadiness on feet 01/20/2021   Chronic kidney disease, stage 3b (HCC) 01/07/2021   Collapsed vertebra, not elsewhere classified, thoracic region, subsequent encounter for fracture with routine healing 01/07/2021   Muscle weakness (generalized) 01/07/2021   Prostate hyperplasia, benign localized, without urinary obstruction 01/07/2021   Nonrheumatic mitral (valve) insufficiency 01/07/2021   Hypokalemia    Normocytic anemia    Atherosclerosis of aorta (HCC)    Irregular heart rhythm    T12 compression fracture (HCC) 01/02/2021   History of kidney stones    Acute kidney injury superimposed on CKD (HCC)    Ureteral stone    Ureteral perforation secondary to stent manipulation 10/16/2017   Abdominal pain 10/16/2017   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/26/2016   Hyperplasia, prostate 02/03/2015   Multinodular goiter 07/03/2014   Thyroid nodule 06/20/2014   Nontoxic uninodular goiter 06/20/2014   Edema 09/24/2013   Encounter for preventive health examination 08/21/2013   Personal history of colonic polyps 08/21/2013   History of venomous snake bite 08/21/2013   Cough 10/02/2012   Prostate hypertrophy    Elevated prostate specific antigen (PSA)    HYPERTRIGLYCERIDEMIA 12/21/2006   Essential hypertension 12/21/2006   GERD 12/21/2006   CALCULUS, KIDNEY 12/21/2006    PCP: Dorothey Baseman, MD   REFERRING PROVIDER: Borders, Daryl Eastern, NP   REFERRING DIAG: C90.00 (ICD-10-CM) - Kappa light chain myeloma (HCC)     Rationale for Evaluation and Treatment: Rehabilitation  THERAPY DIAG:  Muscle weakness (generalized)  Difficulty in walking, not elsewhere classified  Other low back pain  ONSET DATE: >6 months  SUBJECTIVE:  SUBJECTIVE STATEMENT: Pt reports that he has more back pain on this day compared to last session rates, 6/10; but agreeable to PT.   PERTINENT HISTORY:  89 y.o. male with multiple medical problems including hypertension, CKD stage IIIb, A. fib on Eliquis, and anemia.  Patient was hospitalized 01/02/2021 to 01/07/2021 with T12 compression fracture with recommendation for conservative management including use of a TLSO brace.  Patient was hospitalized again 01/29/2021 to 02/07/2021 with recurrent back pain and MR showing T12 and new L1 compression fracture.  Patient underwent T12/L1 kyphoplasty on 02/02/2021.  There was also suspicion of a left fifth rib fracture, which occurred while transitioning from the stretcher for his kyphoplasty.  Patient had mild hypercalcemia with SPEP and UPEP concerning for possible myeloma.  Patient was referred to [facility] for work-up.  He was readmitted 03/11/2021 - 03/17/2021 with worsening back pain.  CT revealed new compression fractures of L2 and L3.  Patient underwent kyphoplasty on 6/30.  He was hospitalized again 03/26/2021-04/07/2021 with severe back/flank pain.  MRI revealed T10 pathologic compression fracture.  He was started on treatment for multiple myeloma with dexamethasone/Velcade and XRT to spine.   PAIN:  Are you having pain? Yes   PRECAUTIONS: Back for comfort, history of compression fractures   WEIGHT BEARING RESTRICTIONS: No  FALLS:  Has patient fallen in last 6 months? Yes. [place] falls 1 at the beach.   PATIENT GOALS: reduce back pain and  improve balance.   NEXT MD VISIT:   OBJECTIVE:   TODAY'S TREATMENT:                                                                                                                              DATE: 03/10/2023   Nustep. Level 3 x 4 min   Piriformis stretch. 2 x 40 sec bil.  Heel cord stretch x 1 min bil.   Seated therex:  LAQ GTB x 8  Hip abduction GTB x 12 Hip flexion GTB x 8  Ankle DF GTB x 15 bil  Sitting on dynadisc ball catch 2 x 1 min, Ball toss sitting on seated without back support x 1 min   Standing with 1 LE on 4 inch step 2 x 30sec bil.   Throughout session, pt required cues for TrA activation with BLE strengthening tasks. Pt also requesting short therapeutic rest break between bouts with mild mid back pain in T5-8 distribution .    PATIENT EDUCATION:  Education details: POC. Pt educated throughout session about proper posture and technique with exercises. Improved exercise technique, movement at target joints, use of target muscles after min to mod verbal, visual, tactile cues.  Person educated: Patient Education method: Medical illustrator Education comprehension: verbalized understanding  HOME EXERCISE PROGRAM: Access Code: [email or web address] URL: [email or web address] Date: 01/28/2023 Prepared by: Grier Rocher  Exercises - Standing Tandem Balance with Counter Support  - 1 x daily - 5 x weekly - 3 sets -  2 reps - 10 hold - Standing Single Leg Stance with Counter Support  - 1 x daily - 5 x weekly - 3 sets - 2 reps - 10 hold - Standing Hip Abduction with Counter Support  - 1 x daily - 5 x weekly - 2 sets - 10 reps - 2 hold  ASSESSMENT:  CLINICAL IMPRESSION: Pt presented to PT with improved endurance and tolerance PT treatment, but increased back pain to 6/10 on this day. Pt able to tolerate seated therex with only mild increase in LBP. Instruction for improved TrA activation to improve deep core activation in functional context.  Will  continue to benefit from interventions moving forward to reduce pain, improve postural control and endurance and improve QoL.          OBJECTIVE IMPAIRMENTS: decreased balance, decreased endurance, decreased mobility, difficulty walking, decreased ROM, decreased strength, hypomobility, impaired perceived functional ability, impaired sensation, improper body mechanics, and postural dysfunction.   ACTIVITY LIMITATIONS: carrying, lifting, standing, squatting, stairs, and locomotion level  PARTICIPATION LIMITATIONS: laundry, shopping, community activity, and yard work  PERSONAL FACTORS: Age and 3+ comorbidities: CA, hypertension, CKD III  are also affecting patient's functional outcome.   REHAB POTENTIAL: Good  CLINICAL DECISION MAKING: Evolving/moderate complexity  EVALUATION COMPLEXITY: Moderate   GOALS: Goals reviewed with patient? No   SHORT TERM GOALS: Target date: 6/120/2024    Patient will be independent in home exercise program to improve strength/mobility for better functional independence with ADLs. Baseline: initiated on 01/20/2023 Goal status: INITIAL   LONG TERM GOALS: Target date: 04/15/2023    Patient will increase FOTO score to equal to or greater than 60   to demonstrate statistically significant improvement in mobility and quality of life.  Baseline: 55 . 6/25: 53  Goal status: IN PROGRESS  2.  Patient (> 40 years old) will complete five times sit to stand test in < 11 seconds indicating an increased LE strength and improved balance. Baseline: 12.9. 6/25: with UE support 13.99 without UE support Goal status: IN PROGRESS  3.  Patient will increase Berg Balance score by > 6 points to demonstrate decreased fall risk during functional activities Baseline: 43 6/25: 47  Goal status: IN PROGRESS  4.  Patient will increase 10 meter walk test to >1.45m/s as to improve gait speed for better community ambulation and to reduce fall risk. Baseline: 0.85m/s. 6/25 1.68m/s  (9.5 sec)  Goal status: MET  5.  Patient will reduce timed up and go to <11 seconds to reduce fall risk and demonstrate improved transfer/gait ability. Baseline: 16.9sec, 6/25 11.17sec  Goal status: IN PROGRESS  6.  Patient will increase FGA >4points as to demonstrate reduced fall risk and improved dynamic gait balance for better safety with community/home ambulation.   Baseline: 22, 6/25: 25 Goal status: IN PROGRESS    PLAN:  PT FREQUENCY: 1-2x/week  PT DURATION: 12 weeks  PLANNED INTERVENTIONS: Therapeutic exercises, Therapeutic activity, Neuromuscular re-education, Balance training, Gait training, Patient/Family education, Self Care, Joint mobilization, Joint manipulation, Stair training, DME instructions, Dry Needling, Spinal mobilization, Moist heat, scar mobilization, and Manual therapy.  PLAN FOR NEXT SESSION:   Balance and BLE strength training for deep core activation.  Improved hip mobility/stretching.  Reassess HEP and adjust as needed.   Gentle therex of low/mid back stabilization.   Grier Rocher PT, DPT  Physical Therapist - [facility]  5:03 PM 03/10/23

## 2023-03-11 ENCOUNTER — Inpatient Hospital Stay (HOSPITAL_BASED_OUTPATIENT_CLINIC_OR_DEPARTMENT_OTHER): Payer: PPO | Admitting: Hospice and Palliative Medicine

## 2023-03-11 DIAGNOSIS — Z515 Encounter for palliative care: Secondary | ICD-10-CM

## 2023-03-11 DIAGNOSIS — G893 Neoplasm related pain (acute) (chronic): Secondary | ICD-10-CM | POA: Diagnosis not present

## 2023-03-11 DIAGNOSIS — C9000 Multiple myeloma not having achieved remission: Secondary | ICD-10-CM | POA: Diagnosis not present

## 2023-03-11 MED ORDER — OXYCODONE HCL 10 MG PO TABS: 10.0000 mg | ORAL_TABLET | ORAL | 0 refills | Status: DC | PRN

## 2023-03-11 MED ORDER — MORPHINE SULFATE ER 30 MG PO TBCR: 30.0000 mg | EXTENDED_RELEASE_TABLET | Freq: Three times a day (TID) | ORAL | 0 refills | Status: DC

## 2023-03-11 NOTE — Progress Notes (Signed)
Virtual Visit via Telephone Note  I connected with Jack [name] on 03/11/23 at  1:00 PM EDT by telephone and verified that I am speaking with the correct person using two identifiers.  Location: Patient: Home Provider: Clinic   I discussed the limitations, risks, security and privacy concerns of performing an evaluation and management service by telephone and the availability of in person appointments. I also discussed with the patient that there may be a patient responsible charge related to this service. The patient expressed understanding and agreed to proceed.   History of Present Illness: Jack [name] is a 89 y.o. male with multiple medical problems including hypertension, CKD stage IIIb, A. fib on Eliquis, and anemia.  Patient was hospitalized 01/02/2021 to 01/07/2021 with T12 compression fracture with recommendation for conservative management including use of a TLSO brace.  Patient was hospitalized again 01/29/2021 to 02/07/2021 with recurrent back pain and MR showing T12 and new L1 compression fracture.  Patient underwent T12/L1 kyphoplasty on 02/02/2021.  There was also suspicion of a left fifth rib fracture, which occurred while transitioning from the stretcher for his kyphoplasty.  Patient had mild hypercalcemia with SPEP and UPEP concerning for possible myeloma.  Patient was referred to [facility] for work-up.  He was readmitted 03/11/2021 - 03/17/2021 with worsening back pain.  CT revealed new compression fractures of L2 and L3.  Patient underwent kyphoplasty on 6/30.  He was hospitalized again 03/26/2021-04/07/2021 with severe back/flank pain.  MRI revealed T10 pathologic compression fracture.  He was started on treatment for multiple myeloma with dexamethasone/Velcade and XRT to spine.   Patient is s/p T11 kyphoplasty and ablation.    Observations/Objective: Spoke with patient and wife by phone.    Patient says he has good days and bad. However, the last few days have good.   Currently denies significant changes or concerns.  Patient reports that he recently saw his dentist given oral pain on Xgeva.  There was question if etiology of pain was stemming from.  periodontal disease but unclear.  Reportedly two XRays were clinically insignificant.  Patient planning on a beach trip next week.  Will refill medications early in anticipation of vacation.  Assessment and Plan: Chronic back pain secondary to multiple myeloma and history of recurrent/chronic compression fractures.  S/p XRT.  Refill MS Contin/oxycodone.  Neuropathy -Continue gabapentin.  Follow Up Instructions: Follow-up telephone visit 1 to 2 months   I discussed the assessment and treatment plan with the patient. The patient was provided an opportunity to ask questions and all were answered. The patient agreed with the plan and demonstrated an understanding of the instructions.   The patient was advised to call back or seek an in-person evaluation if the symptoms worsen or if the condition fails to improve as anticipated.  I provided 10 minutes of non-face-to-face time during this encounter.   Malachy Moan, NP

## 2023-03-14 NOTE — Progress Notes (Unsigned)
Patient: Jack [name]  Service Category: E/M  Provider: Oswaldo Done, MD  DOB: August 26, 1933  DOS: 03/16/2023  Referring Provider: Jeralyn Ruths, MD  MRN: [id number]  Setting: Ambulatory outpatient  PCP: Dorothey Baseman, MD  Type: New Patient  Specialty: Interventional Pain Management    Location: Office  Delivery: Face-to-face     Primary Reason(s) for Visit: Encounter for initial evaluation of one or more chronic problems (new to examiner) potentially causing chronic pain, and posing a threat to normal musculoskeletal function. (Level of risk: High) CC: No chief complaint on file.  HPI  Jack [name] is a 89 y.o. year old, male patient, who comes for the first time to our practice referred by Jeralyn Ruths, MD for our initial evaluation of his chronic pain. He has HYPERTRIGLYCERIDEMIA; Essential hypertension; GERD; CALCULUS, KIDNEY; Prostate hypertrophy; Elevated prostate specific antigen (PSA); Cough; Encounter for preventive health examination; Personal history of colonic polyps; History of venomous snake bite; Edema; Thyroid nodule; Multinodular goiter; Ureteral perforation secondary to stent manipulation; Abdominal pain; T12 compression fracture (HCC); History of kidney stones; Acute kidney injury superimposed on CKD (HCC); Ureteral stone; Hypokalemia; Normocytic anemia; Atherosclerosis of aorta (HCC); Irregular heart rhythm; Protein-calorie malnutrition, severe; Closed compression fracture of body of L1 vertebra (HCC); Palliative care patient; Actinic keratosis; Hyperplasia, prostate; Inguinal hernia; Nonexudative senile macular degeneration of retina; Nontoxic uninodular goiter; Numbness of foot; Sciatica; Sensory polyneuropathy; Shoulder pain; Back pain; Hyperlipidemia LDL goal <70; Constipation; Atrial fibrillation (HCC); Palliative care encounter; Compression fracture of L2 and L3; Kappa light chain myeloma (HCC); Multiple pathological fractures; Acute hypoxemic respiratory failure  (HCC); Right flank pain; Compression fracture of body of thoracic vertebra (HCC); Multiple myeloma (HCC); Pelvic fracture (HCC); Postural dizziness with presyncope; Chronic kidney disease, stage 3b (HCC); Collapsed vertebra, not elsewhere classified, thoracic region, subsequent encounter for fracture with routine healing; Muscle weakness (generalized); Prostate hyperplasia, benign localized, without urinary obstruction; Aneurysm of ascending aorta without rupture (HCC); Statin myopathy; Nonrheumatic mitral (valve) insufficiency; and Unsteadiness on feet on their problem list. Today he comes in for evaluation of his No chief complaint on file.  Pain Assessment: Location:     Radiating:   Onset:   Duration:   Quality:   Severity:  /10 (subjective, self-reported pain score)  Effect on ADL:   Timing:   Modifying factors:   BP:    HR:    Onset and Duration: {Hx; Onset and Duration:210120511} Cause of pain: {Hx; Cause:210120521} Severity: {Pain Severity:210120502} Timing: {Symptoms; Timing:210120501} Aggravating Factors: {Causes; Aggravating pain factors:210120507} Alleviating Factors: {Causes; Alleviating Factors:210120500} Associated Problems: {Hx; Associated problems:210120515} Quality of Pain: {Hx; Symptom quality or Descriptor:210120531} Previous Examinations or Tests: {Hx; Previous examinations or test:210120529} Previous Treatments: {Hx; Previous Treatment:210120503}  Jack [name] is being evaluated for possible interventional pain management therapies for the treatment of his chronic pain.   ***  Jack [name] has been informed that this initial visit was an evaluation only.  On the follow up appointment I will go over the results, including ordered tests and available interventional therapies. At that time he will have the opportunity to decide whether to proceed with offered therapies or not. In the event that Jack [name] prefers avoiding interventional options, this will conclude our  involvement in the case.  Medication management recommendations may be provided upon request.  Historic Controlled Substance Pharmacotherapy Review  PMP and historical list of controlled substances: ***  Most recently prescribed opioid analgesics:   *** MME/day: *** mg/day  Historical Monitoring: The patient  reports no history of drug use. List of prior UDS Testing: No results found for: "MDMA", "COCAINSCRNUR", "PCPSCRNUR", "PCPQUANT", "CANNABQUANT", "THCU", "ETH", "CBDTHCR", "D8THCCBX", "D9THCCBX" Historical Background Evaluation:  PMP: PDMP reviewed during this encounter. Review of the past 69-months conducted.             PMP NARX Score Report:  Narcotic: *** Sedative: *** Stimulant: ***  Department of public safety, offender search: Engineer, mining Information) Non-contributory Risk Assessment Profile: Aberrant behavior: None observed or detected today Risk factors for fatal opioid overdose: None identified today PMP NARX Overdose Risk Score: *** Fatal overdose hazard ratio (HR): Calculation deferred Non-fatal overdose hazard ratio (HR): Calculation deferred Risk of opioid abuse or dependence: 0.7-3.0% with doses ? 36 MME/day and 6.1-26% with doses ? 120 MME/day. Substance use disorder (SUD) risk level: See below Personal History of Substance Abuse (SUD-Substance use disorder):  Alcohol:    Illegal Drugs:    Rx Drugs:    ORT Risk Level calculation:    ORT Scoring interpretation table:  Score <3 = Low Risk for SUD  Score between 4-7 = Moderate Risk for SUD  Score >8 = High Risk for Opioid Abuse   PHQ-2 Depression Scale:  Total score:    PHQ-2 Scoring interpretation table: (Score and probability of major depressive disorder)  Score 0 = No depression  Score 1 = 15.4% Probability  Score 2 = 21.1% Probability  Score 3 = 38.4% Probability  Score 4 = 45.5% Probability  Score 5 = 56.4% Probability  Score 6 = 78.6% Probability   PHQ-9 Depression Scale:  Total score:    PHQ-9  Scoring interpretation table:  Score 0-4 = No depression  Score 5-9 = Mild depression  Score 10-14 = Moderate depression  Score 15-19 = Moderately severe depression  Score 20-27 = Severe depression (2.4 times higher risk of SUD and 2.89 times higher risk of overuse)   Pharmacologic Plan: As per protocol, I have not taken over any controlled substance management, pending the results of ordered tests and/or consults.            Initial impression: Pending review of available data and ordered tests.  Meds   Current Outpatient Medications:    acetaminophen (TYLENOL) 500 MG tablet, Take 500-1,000 mg by mouth every 8 (eight) hours as needed for moderate pain., Disp: , Rfl:    albuterol (VENTOLIN HFA) 108 (90 Base) MCG/ACT inhaler, Inhale into the lungs., Disp: , Rfl:    apixaban (ELIQUIS) 5 MG TABS tablet, Take 1 tablet (5 mg total) by mouth 2 (two) times daily., Disp: 180 tablet, Rfl: 0   Ascorbic Acid (VITAMIN C) 1000 MG tablet, Take 1,000 mg by mouth in the morning and at bedtime., Disp: , Rfl:    Calcium Carb-Cholecalciferol (OYSTER SHELL CALCIUM W/D) 500-5 MG-MCG TABS, Take 1 tablet by mouth in the morning and at bedtime., Disp: , Rfl:    carvedilol (COREG) 12.5 MG tablet, Take 0.5 tablets (6.25 mg total) by mouth 2 (two) times daily., Disp: 180 tablet, Rfl: 3   dexamethasone (DECADRON) 4 MG tablet, TAKE 5 TABLETS BY MOUTH ONE TIME PER WEEK, Disp: 20 tablet, Rfl: 4   feeding supplement (ENSURE ENLIVE / ENSURE PLUS) LIQD, Take 237 mLs by mouth 3 (three) times daily between meals. (Patient taking differently: Take 237 mLs by mouth daily with lunch.), Disp: 237 mL, Rfl: 12   gabapentin (NEURONTIN) 300 MG capsule, Take 2 capsules (600 mg total) by mouth 3 (three) times daily., Disp: 180 capsule, Rfl:  2   ipratropium-albuterol (DUONEB) 0.5-2.5 (3) MG/3ML SOLN, Take 3 mLs by nebulization every 4 (four) hours as needed., Disp: 360 mL, Rfl: 0   lenalidomide (REVLIMID) 20 MG capsule, Take 1 capsule (20  mg total) by mouth daily. Take for 21 days, then hold for 7 days. Repeat every 28 days, Disp: 21 capsule, Rfl: 0   lidocaine (XYLOCAINE) 5 % ointment, Apply topically 3 (three) times daily as needed for mild pain or moderate pain., Disp: 50 g, Rfl: 2   morphine (MS CONTIN) 30 MG 12 hr tablet, Take 1 tablet (30 mg total) by mouth every 8 (eight) hours., Disp: 90 tablet, Rfl: 0   Multiple Vitamin (MULTIVITAMIN WITH MINERALS) TABS tablet, Take 1 tablet by mouth daily., Disp: , Rfl:    Multiple Vitamins-Minerals (PRESERVISION AREDS PO), Take 1 capsule by mouth in the morning and at bedtime., Disp: , Rfl:    naloxone (NARCAN) nasal spray 4 mg/0.1 mL, SPRAY 1 SPRAY INTO ONE NOSTRIL AS DIRECTED FOR OPIOID OVERDOSE (TURN PERSON ON SIDE AFTER DOSE. IF NO RESPONSE IN 2-3 MINUTES OR PERSON RESPONDS BUT RELAPSES, REPEAT USING A NEW SPRAY DEVICE AND SPRAY INTO THE OTHER NOSTRIL. CALL 911 AFTER USE.) * EMERGENCY USE ONLY *, Disp: 1 each, Rfl: 0   Oxycodone HCl 10 MG TABS, Take 1 tablet (10 mg total) by mouth every 4 (four) hours as needed (pain)., Disp: 90 tablet, Rfl: 0   polyethylene glycol (MIRALAX / GLYCOLAX) 17 g packet, Take 17 g by mouth 2 (two) times daily., Disp: , Rfl: 0   potassium citrate (UROCIT-K) 10 MEQ (1080 MG) SR tablet, TAKE 1 TABLET (10 MEQ TOTAL) BY MOUTH 3 (THREE) TIMES DAILY WITH MEALS., Disp: 270 tablet, Rfl: 2   senna-docusate (SENOKOT-S) 8.6-50 MG tablet, Take 1 tablet by mouth 2 (two) times daily., Disp: 30 tablet, Rfl: 0  Imaging Review  Cervical Imaging: Cervical MR wo contrast: No results found for this or any previous visit.  Cervical MR wo contrast: No valid procedures specified. Cervical MR w/wo contrast: No results found for this or any previous visit.  Cervical MR w contrast: No results found for this or any previous visit.  Cervical CT wo contrast: Results for orders placed during the hospital encounter of 06/10/22  CT Cervical Spine Wo Contrast  Narrative CLINICAL  DATA:  Provided history: Abnormal olfaction (CN 1). Neck trauma, abnormal mental status or neuro exam. Additional history provided: Fall. Laceration on top of head. Patient on Eliquis. None.  EXAM: CT HEAD WITHOUT CONTRAST  CT CERVICAL SPINE WITHOUT CONTRAST  TECHNIQUE: Multidetector CT imaging of the head and cervical spine was performed following the standard protocol without intravenous contrast. Multiplanar CT image reconstructions of the cervical spine were also generated.  RADIATION DOSE REDUCTION: This exam was performed according to the departmental dose-optimization program which includes automated exposure control, adjustment of the mA and/or kV according to patient size and/or use of iterative reconstruction technique.  COMPARISON:  None Available.  FINDINGS: CT HEAD FINDINGS  Brain:  Mild generalized parenchymal atrophy.  There is no acute intracranial hemorrhage.  No demarcated cortical infarct.  No extra-axial fluid collection.  No evidence of an intracranial mass.  No midline shift.  Vascular: No hyperdense vessel.  Atherosclerotic calcifications.  Skull: No fracture or aggressive osseous lesion.  Sinuses/Orbits: Mucosal thickening, and small mucous retention cysts, within the right maxillary sinus. Minimal mucosal thickening within the left maxillary sinus. Minimal mucosal thickening within the bilateral ethmoid and frontal sinuses.  Other:  Left parietal scalp hematoma.  CT CERVICAL SPINE FINDINGS  Alignment: Slight grade 1 anterolisthesis at C4-C5. 3 mm C5-C6 grade 1 anterolisthesis. Slight grade 1 anterolisthesis at C6-C7.  Skull base and vertebrae: The basion-dental and atlanto-dental intervals are maintained.No evidence of acute fracture to the cervical spine.  Soft tissues and spinal canal: No prevertebral fluid or swelling. No visible canal hematoma.  Disc levels: Cervical spondylosis with multilevel disc space narrowing, disc  bulges/central disc protrusions, uncovertebral hypertrophy and facet arthrosis. No appreciable high-grade spinal canal stenosis. Multilevel bony neural foraminal narrowing. Degenerative changes are also present about the C1-C2 articulation.  Upper chest: No consolidation within the imaged lung apices. No visible pneumothorax.  IMPRESSION: CT head:  1. No evidence of acute intracranial abnormality. 2. Left parietal scalp hematoma. 3. Mild generalized parenchymal atrophy. 4. Mild paranasal sinus disease, as described.  CT cervical spine:  1. No evidence of acute fracture to the cervical spine. 2. Grade 1 anterolisthesis at C4-C5, C5-C6 and C6-C7. 3. Cervical spondylosis, as described.   Electronically Signed By: Jackey Loge D.O. On: 06/10/2022 10:10  Cervical CT w/wo contrast: No results found for this or any previous visit.  Cervical CT w/wo contrast: No results found for this or any previous visit.  Cervical CT w contrast: No results found for this or any previous visit.  Cervical CT outside: No results found for this or any previous visit.  Cervical DG 1 view: No results found for this or any previous visit.  Cervical DG 2-3 views: No results found for this or any previous visit.  Cervical DG F/E views: No results found for this or any previous visit.  Cervical DG 2-3 clearing views: No results found for this or any previous visit.  Cervical DG Bending/F/E views: No results found for this or any previous visit.  Cervical DG complete: No results found for this or any previous visit.  Cervical DG Myelogram views: No results found for this or any previous visit.  Cervical DG Myelogram views: No results found for this or any previous visit.  Cervical Discogram views: No results found for this or any previous visit.   Shoulder Imaging: Shoulder-R MR w contrast: No results found for this or any previous visit.  Shoulder-L MR w contrast: No results found for this  or any previous visit.  Shoulder-R MR w/wo contrast: No results found for this or any previous visit.  Shoulder-L MR w/wo contrast: No results found for this or any previous visit.  Shoulder-R MR wo contrast: No results found for this or any previous visit.  Shoulder-L MR wo contrast: No results found for this or any previous visit.  Shoulder-R CT w contrast: No results found for this or any previous visit.  Shoulder-L CT w contrast: No results found for this or any previous visit.  Shoulder-R CT w/wo contrast: No results found for this or any previous visit.  Shoulder-L CT w/wo contrast: No results found for this or any previous visit.  Shoulder-R CT wo contrast: No results found for this or any previous visit.  Shoulder-L CT wo contrast: No results found for this or any previous visit.  Shoulder-R DG Arthrogram: No results found for this or any previous visit.  Shoulder-L DG Arthrogram: No results found for this or any previous visit.  Shoulder-R DG 1 view: No results found for this or any previous visit.  Shoulder-L DG 1 view: No results found for this or any previous visit.  Shoulder-R DG: No results found for  this or any previous visit.  Shoulder-L DG: No results found for this or any previous visit.   Thoracic Imaging: Thoracic MR wo contrast: Results for orders placed during the hospital encounter of 05/25/22  MR THORACIC SPINE WO CONTRAST  Narrative CLINICAL DATA:  Mid to low back pain. History of multiple myeloma. Prior kyphoplasty.  EXAM: MRI THORACIC AND LUMBAR SPINE WITHOUT CONTRAST  TECHNIQUE: Multiplanar and multiecho pulse sequences of the thoracic and lumbar spine were obtained without intravenous contrast.  COMPARISON:  None Available.  FINDINGS: MRI THORACIC SPINE FINDINGS  Alignment:  Physiologic.  Vertebrae: Remote compression fractures of T11 and T12, status post vertebral augmentation. No acute fracture. Unchanged unenhanced appearance  of lesions at T8-T9.  Cord:  Normal signal and morphology.  Paraspinal and other soft tissues: Negative.  Disc levels:  No spinal canal stenosis  MRI LUMBAR SPINE FINDINGS  Segmentation:  Standard  Alignment:  Normal  Vertebrae: Remote compression deformities at L1, L2 and L3, status post augmentation. No acute abnormality. No bone marrow abnormality at the other levels.  Conus medullaris and cauda equina: Conus extends to the L1-2 level. Conus and cauda equina appear normal.  Paraspinal and other soft tissues: Negative  Disc levels:  L1-L2: Normal disc space and facet joints. No spinal canal stenosis. No neural foraminal stenosis.  L2-L3: Normal disc space and facet joints. No spinal canal stenosis. No neural foraminal stenosis.  L3-L4: Small disc bulge. No spinal canal stenosis. No neural foraminal stenosis.  L4-L5: Normal disc space and facet joints. No spinal canal stenosis. No neural foraminal stenosis.  L5-S1: Normal disc space and facet joints. No spinal canal stenosis. No neural foraminal stenosis.  Visualized sacrum: Normal.  IMPRESSION: 1. Remote compression fractures of T11, T12, L1, L2 and L3, status post vertebral augmentation. No acute abnormality of the thoracic or lumbar spine. 2. Previously described enhancing lesions at T8 and T9 are poorly characterized without contrast. There unenhanced appearance is unchanged. 3. No spinal canal or neural foraminal stenosis.   Electronically Signed By: Deatra Robinson M.D. On: 05/26/2022 03:32  Thoracic MR wo contrast: No valid procedures specified. Thoracic MR w/wo contrast: Results for orders placed during the hospital encounter of 01/18/23  MR Thoracic Spine W Wo Contrast  Narrative CLINICAL DATA:  Metastatic disease evaluation  EXAM: MRI THORACIC WITHOUT AND WITH CONTRAST  TECHNIQUE: Multiplanar and multiecho pulse sequences of the thoracic spine were obtained without and with intravenous  contrast.  CONTRAST:  7mL GADAVIST GADOBUTROL 1 MMOL/ML IV SOLN  COMPARISON:  MRI thoracic spine 05/25/2022 and 03/25/2022.  FINDINGS: Alignment:  No substantial sagittal subluxation.  Vertebrae: No substantial change in enhancing lesions at T8, T9 and T11. Similar heterogeneous marrow, compatible with history of multiple myeloma. Similar chronic compression fractures at T11, T12, L1, and L2 with prior vertebral augmentation. More inferior lumbar bodies are not imaged on this study. No evidence of acute fracture.  Cord:  Normal cord signal.  Paraspinal and other soft tissues:  Known ascending thoracic aortic aneurysm is suboptimally assessed by protocol. Paraspinal soft tissues otherwise within normal limits.  Disc levels:  Similar mild for age multilevel degenerative change without high-grade canal or foraminal narrowing. Similar mild to moderate foraminal narrowing bilaterally at T10-T11 and T11-T12.  IMPRESSION: 1. Similar heterogeneous bone marrow, compatible with reported history of multiple myeloma. Similar STIR signal abnormality and enhancement involving multiple lower thoracic levels without evidence of progressive disease. 2. Similar chronic compression fractures. No evidence of acute fracture.   Electronically  Signed By: Feliberto Harts M.D. On: 01/22/2023 17:07  Thoracic MR w contrast: No results found for this or any previous visit.  Thoracic CT wo contrast: Results for orders placed during the hospital encounter of 01/29/21  CT Thoracic Spine Wo Contrast  Narrative CLINICAL DATA:  Mid back pain; mid back pain, history of T12 fracture, evaluate for additional trauma. Additional provided: Patient reports back pain from old injury, nausea, symptoms persist in for 3 days.  EXAM: CT THORACIC SPINE WITHOUT CONTRAST  TECHNIQUE: Multidetector CT images of the thoracic were obtained using the standard protocol without intravenous  contrast.  COMPARISON:  CT abdomen/pelvis 01/02/2021.  FINDINGS: Alignment: No significant spondylolisthesis. Unchanged mild bony retropulsion at the level of the T12 superior endplate.  Vertebrae: Mild interval progression of height loss at site of a previously demonstrated T12 compression fracture (now 60%). Vertebral body height is otherwise maintained within the thoracic spine. No evidence of fracture elsewhere within the thoracic spine. T9 vertebral body hemangioma. Multilevel ventrolateral osteophytes.  Paraspinal and other soft tissues: Linear atelectasis within the imaged lung bases. Hiatal hernia. Aortoiliac atherosclerosis.  Disc levels:  Multilevel disc space narrowing. Most notably, there is moderate to moderately advanced disc space narrowing at the T7-T8 through T9-T12 levels.  At T11-T12, there is mild bony retropulsion at the level of the T12 superior endplate. Associated disc bulge. Mild relative narrowing of the ventral spinal canal. Mild bilateral neural foraminal narrowing.  No appreciable significant disc herniation or spinal canal stenosis at the remaining thoracic levels. No compressive foraminal narrowing.  IMPRESSION: Mild interval progression of a previously demonstrated T12 compression fracture (now with 60% height loss). Unchanged mild bony retropulsion at the level of the T12 superior endplate. Associated disc bulge at T11-T12. Resultant mild relative narrowing of the spinal canal at this level. Mild bilateral T11-T12 neural foraminal narrowing.  No other thoracic spine fracture is identified.  No significant disc herniation, spinal canal stenosis or compressive neural foraminal narrowing at the remaining thoracic levels.  Linear atelectasis within the imaged lung bases.  Hiatal hernia.  Aortic Atherosclerosis (ICD10-I70.0).   Electronically Signed By: Jackey Loge DO On: 01/29/2021 16:30  Thoracic CT w/wo contrast: No results found  for this or any previous visit.  Thoracic CT w/wo contrast: No results found for this or any previous visit.  Thoracic CT w contrast: No results found for this or any previous visit.  Thoracic DG 2-3 views: No results found for this or any previous visit.  Thoracic DG 4 views: No results found for this or any previous visit.  Thoracic DG: No results found for this or any previous visit.  Thoracic DG w/swimmers view: No results found for this or any previous visit.  Thoracic DG Myelogram views: No results found for this or any previous visit.  Thoracic DG Myelogram views: No results found for this or any previous visit.   Lumbosacral Imaging: Lumbar MR wo contrast: Results for orders placed during the hospital encounter of 05/25/22  MR LUMBAR SPINE WO CONTRAST  Narrative CLINICAL DATA:  Mid to low back pain. History of multiple myeloma. Prior kyphoplasty.  EXAM: MRI THORACIC AND LUMBAR SPINE WITHOUT CONTRAST  TECHNIQUE: Multiplanar and multiecho pulse sequences of the thoracic and lumbar spine were obtained without intravenous contrast.  COMPARISON:  None Available.  FINDINGS: MRI THORACIC SPINE FINDINGS  Alignment:  Physiologic.  Vertebrae: Remote compression fractures of T11 and T12, status post vertebral augmentation. No acute fracture. Unchanged unenhanced appearance of lesions at T8-T9.  Cord:  Normal signal and morphology.  Paraspinal and other soft tissues: Negative.  Disc levels:  No spinal canal stenosis  MRI LUMBAR SPINE FINDINGS  Segmentation:  Standard  Alignment:  Normal  Vertebrae: Remote compression deformities at L1, L2 and L3, status post augmentation. No acute abnormality. No bone marrow abnormality at the other levels.  Conus medullaris and cauda equina: Conus extends to the L1-2 level. Conus and cauda equina appear normal.  Paraspinal and other soft tissues: Negative  Disc levels:  L1-L2: Normal disc space and facet joints. No  spinal canal stenosis. No neural foraminal stenosis.  L2-L3: Normal disc space and facet joints. No spinal canal stenosis. No neural foraminal stenosis.  L3-L4: Small disc bulge. No spinal canal stenosis. No neural foraminal stenosis.  L4-L5: Normal disc space and facet joints. No spinal canal stenosis. No neural foraminal stenosis.  L5-S1: Normal disc space and facet joints. No spinal canal stenosis. No neural foraminal stenosis.  Visualized sacrum: Normal.  IMPRESSION: 1. Remote compression fractures of T11, T12, L1, L2 and L3, status post vertebral augmentation. No acute abnormality of the thoracic or lumbar spine. 2. Previously described enhancing lesions at T8 and T9 are poorly characterized without contrast. There unenhanced appearance is unchanged. 3. No spinal canal or neural foraminal stenosis.   Electronically Signed By: Deatra Robinson M.D. On: 05/26/2022 03:32  Lumbar MR wo contrast: No valid procedures specified. Lumbar MR w/wo contrast: Results for orders placed during the hospital encounter of 03/25/22  MR Lumbar Spine W Wo Contrast  Narrative CLINICAL DATA:  Low back pain.  EXAM: MRI LUMBAR SPINE WITHOUT AND WITH CONTRAST  TECHNIQUE: Multiplanar and multiecho pulse sequences of the lumbar spine were obtained without and with intravenous contrast.  CONTRAST:  7.76mL GADAVIST GADOBUTROL 1 MMOL/ML IV SOLN  COMPARISON:  03/27/2021  FINDINGS: Segmentation:  Standard.  Alignment:  Physiologic.  Vertebrae: Incompletely visualized subacute T11 vertebral body compression fracture better evaluated on the MRI thoracic spine performed earlier same day and reported separately.  Chronic T12, L1, L2 and L3 vertebral body compression fractures with prior augmentation. 4 mm retropulsion of the superior posterior margin of the T12 vertebral body mildly impressing on the thecal sac without spinal stenosis. 3 mm retropulsion of the superior posterior margin of  the L1 and L3 vertebral bodies mildly impressing upon the thecal sac. Chronic L4 vertebral body compression fracture.  Conus medullaris and cauda equina: Conus extends to the L1 level. Conus and cauda equina appear normal.  Paraspinal and other soft tissues: No acute paraspinal abnormality.  Disc levels:  Disc spaces: Disc desiccation throughout the lumbar spine.  T12-L1: No disc protrusion. No spinal stenosis. Mild bilateral foraminal stenosis.  L1-L2: No significant disc bulge. No neural foraminal stenosis. No central canal stenosis.  L2-L3: No significant disc protrusion. Mild left foraminal stenosis. No right foraminal stenosis. No spinal stenosis.  L3-L4: Mild broad-based disc bulge. Mild bilateral facet arthropathy. No foraminal or central canal stenosis.  L4-L5: Mild broad-based disc bulge. Mild bilateral facet arthropathy. No foraminal or central canal stenosis.  L5-S1: No significant disc bulge. No neural foraminal stenosis. No central canal stenosis.  IMPRESSION: 1. No acute osseous injury of the lumbar spine. 2. Incompletely visualized subacute T11 vertebral body compression fracture better evaluated on the MRI thoracic spine performed earlier same day and reported separately. 3. Chronic T12, L1, L2 and L3 vertebral body compression fractures with prior augmentation. Mild thoracolumbar spine spondylosis.   Electronically Signed By: Elige Ko M.D. On:  03/26/2022 15:07  Lumbar MR w/wo contrast: No results found for this or any previous visit.  Lumbar MR w contrast: No results found for this or any previous visit.  Lumbar CT wo contrast: No results found for this or any previous visit.  Lumbar CT w/wo contrast: No results found for this or any previous visit.  Lumbar CT w/wo contrast: No results found for this or any previous visit.  Lumbar CT w contrast: No results found for this or any previous visit.  Lumbar DG 1V: No results found for this or any  previous visit.  Lumbar DG 1V (Clearing): No results found for this or any previous visit.  Lumbar DG 2-3V (Clearing): No results found for this or any previous visit.  Lumbar DG 2-3 views: Results for orders placed during the hospital encounter of 03/11/21  DG Lumbar Spine 2-3 Views  Narrative CLINICAL DATA:  Provided history: Fracture. Additional history provided: L2-L3 kyphoplasty. Provided fluoroscopy time 2 minutes, 27 seconds.  EXAM: LUMBAR SPINE - 2-3 VIEW; DG C-ARM 1-60 MIN  COMPARISON:  CT abdomen/pelvis 03/11/2021.  FINDINGS: AP and lateral view intraprocedural fluoroscopic images of the lumbar spine are submitted, 2 images total. On the provided images, kyphoplasty material is present within three contiguous lumbar vertebral bodies (these are presumably the L1, L2 and L3 vertebral bodies given the provided history and findings on prior imaging. However, neither the thoracolumbar nor lumbosacral junction are included in the field of view to allow exact level determination).  IMPRESSION: Two intraprocedural fluoroscopic images of the lumbar spine from reported L2 and L3 kyphoplasty, as described.   Electronically Signed By: Jackey Loge DO On: 03/12/2021 18:03  Lumbar DG (Complete) 4+V: No results found for this or any previous visit.        Lumbar DG F/E views: No results found for this or any previous visit.        Lumbar DG Bending views: No results found for this or any previous visit.        Lumbar DG Myelogram views: No results found for this or any previous visit.  Lumbar DG Myelogram: No results found for this or any previous visit.  Lumbar DG Myelogram: No results found for this or any previous visit.  Lumbar DG Myelogram: No results found for this or any previous visit.  Lumbar DG Myelogram Lumbosacral: No results found for this or any previous visit.  Lumbar DG Diskogram views: No results found for this or any previous visit.  Lumbar DG  Diskogram views: No results found for this or any previous visit.  Lumbar DG Epidurogram OP: No results found for this or any previous visit.  Lumbar DG Epidurogram IP: No valid procedures specified.  Sacroiliac Joint Imaging: Sacroiliac Joint DG: No results found for this or any previous visit.  Sacroiliac Joint MR w/wo contrast: No results found for this or any previous visit.  Sacroiliac Joint MR wo contrast: No results found for this or any previous visit.   Spine Imaging: Whole Spine DG Myelogram views: No results found for this or any previous visit.  Whole Spine MR Mets screen: No results found for this or any previous visit.  Whole Spine MR Mets screen: No results found for this or any previous visit.  Whole Spine MR w/wo: No results found for this or any previous visit.  MRA Spinal Canal w/ cm: No results found for this or any previous visit.  MRA Spinal Canal wo/ cm: No valid procedures specified. MRA Spinal  Canal w/wo cm: No results found for this or any previous visit.  Spine Outside MR Films: No results found for this or any previous visit.  Spine Outside CT Films: No results found for this or any previous visit.  CT-Guided Biopsy: No results found for this or any previous visit.  CT-Guided Needle Placement: No results found for this or any previous visit.  DG Spine outside: No results found for this or any previous visit.  IR Spine outside: No results found for this or any previous visit.  NM Spine outside: No results found for this or any previous visit.   Hip Imaging: Hip-R MR w contrast: No results found for this or any previous visit.  Hip-L MR w contrast: No results found for this or any previous visit.  Hip-R MR w/wo contrast: No results found for this or any previous visit.  Hip-L MR w/wo contrast: No results found for this or any previous visit.  Hip-R MR wo contrast: No results found for this or any previous visit.  Hip-L MR wo contrast: No  results found for this or any previous visit.  Hip-R CT w contrast: No results found for this or any previous visit.  Hip-L CT w contrast: No results found for this or any previous visit.  Hip-R CT w/wo contrast: No results found for this or any previous visit.  Hip-L CT w/wo contrast: No results found for this or any previous visit.  Hip-R CT wo contrast: No results found for this or any previous visit.  Hip-L CT wo contrast: No results found for this or any previous visit.  Hip-R DG 2-3 views: No results found for this or any previous visit.  Hip-L DG 2-3 views: Results for orders placed during the hospital encounter of 06/10/22  DG Hip Unilat W or Wo Pelvis 2-3 Views Left  Narrative CLINICAL DATA:  Fall, left hip pain  EXAM: DG HIP (WITH OR WITHOUT PELVIS) 2-3V LEFT  COMPARISON:  None Available.  FINDINGS: Vertebroplasty material overlies chronic L4 compression fracture. Hernia repair mesh overlies the left pelvis. Acute nondisplaced fracture of the lateral aspect of the left superior pubic ramus, potentially extending into the medial wall of the left acetabulum. Acute nondisplaced fracture of the medial aspect of the inferior left pubic ramus extending into the left pubic symphysis. No hip dislocation. Mild bilateral hip osteoarthritis. No suspicious focal osseous lesions.  IMPRESSION: Acute nondisplaced fracture of the lateral aspect of the left superior pubic ramus, potentially extending into the medial wall of the left acetabulum.  Acute nondisplaced fracture of the medial aspect of the inferior left pubic ramus extending into the left pubic symphysis.   Electronically Signed By: Delbert Phenix M.D. On: 06/10/2022 10:04  Hip-R DG Arthrogram: No results found for this or any previous visit.  Hip-L DG Arthrogram: No results found for this or any previous visit.  Hip-B DG Bilateral: No results found for this or any previous visit.   Knee Imaging: Knee-R MR  w contrast: No results found for this or any previous visit.  Knee-L MR w/o contrast: No results found for this or any previous visit.  Knee-R MR w/wo contrast: No results found for this or any previous visit.  Knee-L MR w/wo contrast: No results found for this or any previous visit.  Knee-R MR wo contrast: No results found for this or any previous visit.  Knee-L MR wo contrast: No results found for this or any previous visit.  Knee-R CT w contrast: No  results found for this or any previous visit.  Knee-L CT w contrast: No results found for this or any previous visit.  Knee-R CT w/wo contrast: No results found for this or any previous visit.  Knee-L CT w/wo contrast: No results found for this or any previous visit.  Knee-R CT wo contrast: No results found for this or any previous visit.  Knee-L CT wo contrast: No results found for this or any previous visit.  Knee-R DG 1-2 views: No results found for this or any previous visit.  Knee-L DG 1-2 views: No results found for this or any previous visit.  Knee-R DG 3 views: No results found for this or any previous visit.  Knee-L DG 3 views: No results found for this or any previous visit.  Knee-R DG 4 views: No results found for this or any previous visit.  Knee-L DG 4 views: No results found for this or any previous visit.  Knee-R DG Arthrogram: No results found for this or any previous visit.  Knee-L DG Arthrogram: No results found for this or any previous visit.   Ankle Imaging: Ankle-R DG Complete: No results found for this or any previous visit.  Ankle-L DG Complete: No results found for this or any previous visit.   Foot Imaging: Foot-R DG Complete: No results found for this or any previous visit.  Foot-L DG Complete: No results found for this or any previous visit.   Elbow Imaging: Elbow-R DG Complete: No results found for this or any previous visit.  Elbow-L DG Complete: No results found for this or any  previous visit.   Wrist Imaging: Wrist-R DG Complete: No results found for this or any previous visit.  Wrist-L DG Complete: No results found for this or any previous visit.   Hand Imaging: Hand-R DG Complete: No results found for this or any previous visit.  Hand-L DG Complete: No results found for this or any previous visit.   Complexity Note: Imaging results reviewed.                         ROS  Cardiovascular: {Hx; Cardiovascular History:210120525} Pulmonary or Respiratory: {Hx; Pumonary and/or Respiratory History:210120523} Neurological: {Hx; Neurological:210120504} Psychological-Psychiatric: {Hx; Psychological-Psychiatric History:210120512} Gastrointestinal: {Hx; Gastrointestinal:210120527} Genitourinary: {Hx; Genitourinary:210120506} Hematological: {Hx; Hematological:210120510} Endocrine: {Hx; Endocrine history:210120509} Rheumatologic: {Hx; Rheumatological:210120530} Musculoskeletal: {Hx; Musculoskeletal:210120528} Work History: {Hx; Work history:210120514}  Allergies  Mr. Malden is allergic to quinolones, amlodipine, azithromycin, lipitor [atorvastatin], lisinopril, and zetia [ezetimibe].  Laboratory Chemistry Profile   Renal Lab Results  Component Value Date   BUN 21 03/07/2023   CREATININE 1.23 03/07/2023   LABCREA 96.68 02/03/2021   BCR 12 02/24/2021   GFR 59.90 (L) 09/24/2013   GFRAA 29 (L) 10/16/2017   GFRNONAA 57 (L) 03/07/2023   SPECGRAV 1.020 06/11/2021   PHUR 5.5 06/11/2021   PROTEINUR 30 (A) 06/10/2022     Electrolytes Lab Results  Component Value Date   NA 136 03/07/2023   K 4.1 03/07/2023   CL 101 03/07/2023   CALCIUM 8.8 (L) 03/07/2023   MG 2.3 04/03/2021     Hepatic Lab Results  Component Value Date   AST 21 03/07/2023   ALT 17 03/07/2023   ALBUMIN 3.3 (L) 03/07/2023   ALKPHOS 84 03/07/2023   LIPASE 32 03/26/2021     ID Lab Results  Component Value Date   SARSCOV2NAA POSITIVE (A) 11/01/2022   STAPHAUREUS NEGATIVE  01/30/2021   MRSAPCR NEGATIVE 01/30/2021  Bone Lab Results  Component Value Date   VD25OH 28 (L) 08/23/2013     Endocrine Lab Results  Component Value Date   GLUCOSE 169 (H) 03/07/2023   GLUCOSEU 150 (A) 06/10/2022   TSH 3.840 02/24/2021   FREET4 1.04 07/09/2014     Neuropathy No results found for: "VITAMINB12", "FOLATE", "HGBA1C", "HIV"   CNS No results found for: "COLORCSF", "APPEARCSF", "RBCCOUNTCSF", "WBCCSF", "POLYSCSF", "LYMPHSCSF", "EOSCSF", "PROTEINCSF", "GLUCCSF", "JCVIRUS", "CSFOLI", "IGGCSF", "LABACHR", "ACETBL"   Inflammation (CRP: Acute  ESR: Chronic) Lab Results  Component Value Date   LATICACIDVEN 1.4 11/01/2022     Rheumatology No results found for: "RF", "ANA", "LABURIC", "URICUR", "LYMEIGGIGMAB", "LYMEABIGMQN", "HLAB27"   Coagulation Lab Results  Component Value Date   INR 1.1 04/15/2022   LABPROT 14.4 04/15/2022   APTT 33 03/11/2021   PLT 87 (L) 03/07/2023   DDIMER 3.68 (H) 06/10/2022     Cardiovascular Lab Results  Component Value Date   BNP 160.7 (H) 06/10/2022   CKTOTAL 120 01/24/2013   CKMB 1.8 01/24/2013   HGB 11.6 (L) 03/07/2023   HCT 36.0 (L) 03/07/2023     Screening Lab Results  Component Value Date   SARSCOV2NAA POSITIVE (A) 11/01/2022   STAPHAUREUS NEGATIVE 01/30/2021   MRSAPCR NEGATIVE 01/30/2021     Cancer No results found for: "CEA", "CA125", "LABCA2"   Allergens No results found for: "ALMOND", "APPLE", "ASPARAGUS", "AVOCADO", "BANANA", "BARLEY", "BASIL", "BAYLEAF", "GREENBEAN", "LIMABEAN", "WHITEBEAN", "BEEFIGE", "REDBEET", "BLUEBERRY", "BROCCOLI", "CABBAGE", "MELON", "CARROT", "CASEIN", "CASHEWNUT", "CAULIFLOWER", "CELERY"     Note: Lab results reviewed.  PFSH  Drug: Jack [name]  reports no history of drug use. Alcohol:  reports that he does not currently use alcohol after a past usage of about 1.0 standard drink of alcohol per week. Tobacco:  reports that he quit smoking about 57 years ago. His smoking use  included cigarettes. He has never used smokeless tobacco. Medical:  has a past medical history of Ascending aortic aneurysm (HCC), CKD (chronic kidney disease), stage III (HCC), Diastolic dysfunction, Elevated prostate specific antigen (PSA), GERD (gastroesophageal reflux disease), History of colon polyps (2008), History of kidney stones, Hyperlipidemia, Hypertension, Myeloma (HCC), PAF (paroxysmal atrial fibrillation) (HCC), Pain, and Prostate hypertrophy. Family: family history includes Cancer in his sister; Hyperlipidemia in his father; Hypertension in his father.  Past Surgical History:  Procedure Laterality Date   CATARACT EXTRACTION W/PHACO Left 01/10/2018   Procedure: CATARACT EXTRACTION PHACO AND INTRAOCULAR LENS PLACEMENT (IOC);  Surgeon: Galen Manila, MD;  Location: [facility];  Service: Ophthalmology;  Laterality: Left;  [name] 00:24.8 AP% 14.9 CDE 3.68 Fluid pack lot # [id number]   CATARACT EXTRACTION W/PHACO Right 01/25/2018   Procedure: CATARACT EXTRACTION PHACO AND INTRAOCULAR LENS PLACEMENT (IOC);  Surgeon: Galen Manila, MD;  Location: [facility];  Service: Ophthalmology;  Laterality: Right;  [name] 00:42 AP% 10.8 CDE 4.59 Fluid pack lot # [id number]   COLON SURGERY     CYSTOSCOPY W/ URETERAL STENT PLACEMENT Right 10/16/2017   Procedure: right  URETERAL STENT PLACEMENT,cystoscopy bilateral stent removal,rretrograde;  Surgeon: Riki Altes, MD;  Location: [facility];  Service: Urology;  Laterality: Right;   CYSTOSCOPY/URETEROSCOPY/HOLMIUM LASER/STENT PLACEMENT Right 12/16/2020   Procedure: CYSTOSCOPY/URETEROSCOPY/HOLMIUM LASER/STENT PLACEMENT;  Surgeon: Riki Altes, MD;  Location: [facility];  Service: Urology;  Laterality: Right;   EXTRACORPOREAL SHOCK WAVE LITHOTRIPSY Right 12/11/2020   Procedure: EXTRACORPOREAL SHOCK WAVE LITHOTRIPSY (ESWL);  Surgeon: Riki Altes, MD;  Location: [facility];  Service: Urology;  Laterality: Right;   IR BONE TUMOR(S)RF ABLATION  04/16/2022   IR KYPHO  EA ADDL LEVEL THORACIC OR LUMBAR  02/02/2021   IR KYPHO LUMBAR INC FX REDUCE BONE BX UNI/BIL CANNULATION INC/IMAGING  02/02/2021   IR KYPHO THORACIC WITH BONE BIOPSY  04/15/2022   KIDNEY STONE SURGERY     KYPHOPLASTY N/A 03/12/2021   Procedure: Jack [name], L3;  Surgeon: Kennedy Bucker, MD;  Location: [facility];  Service: Orthopedics;  Laterality: N/A;   RESECTION SOFT TISSUE TUMOR LEG / ANKLE RADICAL  jan 2009   [facility],  right thigh/knee , nonmalignant   SMALL INTESTINE SURGERY  1946   implaed on picket fence, punctured stomach   TONSILLECTOMY     Active Ambulatory Problems    Diagnosis Date Noted   HYPERTRIGLYCERIDEMIA 12/21/2006   Essential hypertension 12/21/2006   GERD 12/21/2006   CALCULUS, KIDNEY 12/21/2006   Prostate hypertrophy    Elevated prostate specific antigen (PSA)    Cough 10/02/2012   Encounter for preventive health examination 08/21/2013   Personal history of colonic polyps 08/21/2013   History of venomous snake bite 08/21/2013   Edema 09/24/2013   Thyroid nodule 06/20/2014   Multinodular goiter 07/03/2014   Ureteral perforation secondary to stent manipulation 10/16/2017   Abdominal pain 10/16/2017   T12 compression fracture (HCC) 01/02/2021   History of kidney stones    Acute kidney injury superimposed on CKD (HCC)    Ureteral stone    Hypokalemia    Normocytic anemia    Atherosclerosis of aorta (HCC)    Irregular heart rhythm    Protein-calorie malnutrition, severe 01/31/2021   Closed compression fracture of body of L1 vertebra (HCC) 02/07/2021   Palliative care patient 02/17/2021   Actinic keratosis 02/19/2021   Hyperplasia, prostate 02/03/2015   Inguinal hernia 02/19/2021   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   Nontoxic uninodular goiter 06/20/2014   Numbness of foot 02/19/2021   Sciatica 02/19/2021   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/26/2016   Shoulder pain 02/19/2021   Back pain 03/11/2021   Hyperlipidemia LDL goal <70     Constipation    Atrial fibrillation (HCC) 03/11/2021   Palliative care encounter    Compression fracture of L2 and L3 03/11/2021   Kappa light chain myeloma (HCC)    Multiple pathological fractures 03/14/2021   Acute hypoxemic respiratory failure (HCC) 03/26/2021   Right flank pain 03/26/2021   Compression fracture of body of thoracic vertebra (HCC)    Multiple myeloma (HCC) 05/05/2021   Pelvic fracture (HCC) 06/10/2022   Postural dizziness with presyncope 06/11/2022   Chronic kidney disease, stage 3b (HCC) 01/07/2021   Collapsed vertebra, not elsewhere classified, thoracic region, subsequent encounter for fracture with routine healing 01/07/2021   Muscle weakness (generalized) 01/07/2021   Prostate hyperplasia, benign localized, without urinary obstruction 01/07/2021   Aneurysm of ascending aorta without rupture (HCC) 08/26/2022   Statin myopathy 08/26/2022   Nonrheumatic mitral (valve) insufficiency 01/07/2021   Unsteadiness on feet 01/20/2021   Resolved Ambulatory Problems    Diagnosis Date Noted   Hypertension    CKD (chronic kidney disease), stage IV (HCC)    Lumbar compression fracture (HCC) 03/12/2021   Past Medical History:  Diagnosis Date   Ascending aortic aneurysm (HCC)    CKD (chronic kidney disease), stage III (HCC)    Diastolic dysfunction    GERD (gastroesophageal reflux disease)    History of colon polyps 2008   Hyperlipidemia    Myeloma (HCC)    PAF (paroxysmal atrial fibrillation) (HCC)    Pain  Constitutional Exam  General appearance: Well nourished, well developed, and well hydrated. In no apparent acute distress There were no vitals filed for this visit. BMI Assessment: Estimated body mass index is 25.61 kg/m as calculated from the following:   Height as of 03/07/23: 5\' 7"  (1.702 m).   Weight as of 03/07/23: 163 lb 8 oz (74.2 kg).  BMI interpretation table: BMI level Category Range association with higher incidence of chronic pain  <18 kg/m2  Underweight   18.5-24.9 kg/m2 Ideal body weight   25-29.9 kg/m2 Overweight Increased incidence by 20%  30-34.9 kg/m2 Obese (Class I) Increased incidence by 68%  35-39.9 kg/m2 Severe obesity (Class II) Increased incidence by 136%  >40 kg/m2 Extreme obesity (Class III) Increased incidence by 254%   Patient's current BMI Ideal Body weight  There is no height or weight on file to calculate BMI. Ideal body weight: 66.1 kg (145 lb 11.6 oz) Adjusted ideal body weight: 69.3 kg (152 lb 13.3 oz)   BMI Readings from Last 4 Encounters:  03/07/23 25.61 kg/m  03/04/23 25.41 kg/m  02/08/23 24.04 kg/m  01/11/23 25.56 kg/m   Wt Readings from Last 4 Encounters:  03/07/23 163 lb 8 oz (74.2 kg)  03/04/23 162 lb 4 oz (73.6 kg)  02/08/23 158 lb 1.6 oz (71.7 kg)  01/11/23 168 lb 1.6 oz (76.2 kg)    Psych/Mental status: Alert, oriented x 3 (person, place, & time)       Eyes: PERLA Respiratory: No evidence of acute respiratory distress  Assessment  Primary Diagnosis & Pertinent Problem List: There were no encounter diagnoses.  Visit Diagnosis (New problems to examiner): No diagnosis found. Plan of Care (Initial workup plan)  Note: Jack [name] was reminded that as per protocol, today's visit has been an evaluation only. We have not taken over the patient's controlled substance management.  Problem-specific plan: No problem-specific Assessment & Plan notes found for this encounter.  Lab Orders  No laboratory test(s) ordered today   Imaging Orders  No imaging studies ordered today   Referral Orders  No referral(s) requested today   Procedure Orders    No procedure(s) ordered today   Pharmacotherapy (current): Medications ordered:  No orders of the defined types were placed in this encounter.  Medications administered during this visit: Jack [name] had no medications administered during this visit.   Analgesic Pharmacotherapy:  Opioid Analgesics: For patients currently taking or  requesting to take opioid analgesics, in accordance with [facility] Guidelines, we will assess their risks and indications for the use of these substances. After completing our evaluation, we may offer recommendations, but we no longer take patients for medication management. The prescribing physician will ultimately decide, based on his/her training and level of comfort whether to adopt any of the recommendations, including whether or not to prescribe such medicines.  Membrane stabilizer: To be determined at a later time  Muscle relaxant: To be determined at a later time  NSAID: To be determined at a later time  Other analgesic(s): To be determined at a later time   Interventional management options: Jack [name] was informed that there is no guarantee that he would be a candidate for interventional therapies. The decision will be based on the results of diagnostic studies, as well as Jack [name] risk profile.  Procedure(s) under consideration:  Pending results of ordered studies      Interventional Therapies  Risk Factors  Considerations:     Planned  Pending:  Under consideration:   Pending   Completed:   None at this time   Therapeutic  Palliative (PRN) options:   None established   Completed by other providers:   None reported       Provider-requested follow-up: No follow-ups on file.  Future Appointments  Date Time Provider Department Center  03/15/2023 11:45 AM Thresa Ross B, PT [facility] None  03/16/2023  1:00 PM Delano Metz, MD [facility] None  03/28/2023  2:00 PM Kemper Durie, RN [facility] None  03/30/2023 10:15 AM Thresa Ross B, PT [facility] None  04/01/2023 10:15 AM Golden Pop, PT [facility] None  04/01/2023  1:45 PM Alphonse Guild, RD [facility] None  04/04/2023  1:15 PM Thresa Ross B, PT [facility] None  04/05/2023  2:00 PM [facility] None  04/05/2023  2:30 PM Jeralyn Ruths, MD [facility] None  04/05/2023  3:00  PM Remi Haggard, RPH-CPP [facility] None  04/06/2023  2:00 PM Thresa Ross B, PT [facility] None  04/11/2023 11:45 AM Thresa Ross B, PT [facility] None  04/13/2023 11:00 AM Thresa Ross B, PT [facility] None  04/18/2023 10:15 AM Thresa Ross B, PT [facility] None  04/20/2023 11:00 AM Thresa Ross B, PT [facility] None  04/25/2023 11:45 AM Thresa Ross B, PT [facility] None  04/27/2023 11:00 AM Thresa Ross B, PT [facility] None  05/02/2023 11:00 AM Thresa Ross B, PT [facility] None  05/04/2023 11:00 AM Thresa Ross B, PT [facility] None  05/09/2023 11:45 AM Thresa Ross B, PT [facility] None  05/11/2023 11:00 AM Thresa Ross B, PT [facility] None  05/11/2023  2:00 PM Borders, Daryl Eastern, NP [facility] None  05/18/2023 11:45 AM Thresa Ross B, PT [facility] None  05/23/2023 10:15 AM Thresa Ross B, PT [facility] None  05/25/2023  9:30 AM Lenda Kelp, PT [facility] None  05/30/2023  9:30 AM Lenda Kelp, PT [facility] None  06/01/2023 10:15 AM Lenda Kelp, PT [facility] None  06/06/2023 11:45 AM Thresa Ross B, PT [facility] None  06/08/2023 11:45 AM Thresa Ross B, PT [facility] None  06/13/2023 11:45 AM Thresa Ross B, PT [facility] None  06/15/2023 11:45 AM Thresa Ross B, PT [facility] None  06/20/2023 11:45 AM Thresa Ross B, PT [facility] None  06/22/2023 11:45 AM Thresa Ross B, PT [facility] None  07/04/2023 10:15 AM Thresa Ross B, PT [facility] None  07/06/2023 11:45 AM Thresa Ross B, PT [facility] None    Duration of encounter: *** minutes.  Total time on encounter, as per AMA guidelines included both the face-to-face and non-face-to-face time personally spent by the physician and/or other qualified health care professional(s) on the day of the encounter (includes time in activities that require the physician or other qualified health care professional and does not include time in activities  normally performed by clinical staff). Physician's time may include the following activities when performed: Preparing to see the patient (e.g., pre-charting review of records, searching for previously ordered imaging, lab work, and nerve conduction tests) Review of prior analgesic pharmacotherapies. Reviewing PMP Interpreting ordered tests (e.g., lab work, imaging, nerve conduction tests) Performing post-procedure evaluations, including interpretation of diagnostic procedures Obtaining and/or reviewing separately obtained history Performing a medically appropriate examination and/or evaluation Counseling and educating the patient/family/caregiver Ordering medications, tests, or procedures Referring and communicating with other health care professionals (when not separately reported) Documenting clinical information in the electronic or other health record Independently interpreting results (not separately reported) and communicating results to the patient/ family/caregiver Care coordination (not separately reported)  Note by: Oswaldo Done, MD (TTS technology used. I apologize for any typographical errors that were not detected and corrected.) Date: 03/16/2023; Time: 6:15 AM

## 2023-03-15 ENCOUNTER — Ambulatory Visit: Payer: PPO | Attending: Hospice and Palliative Medicine | Admitting: Physical Therapy

## 2023-03-15 DIAGNOSIS — R262 Difficulty in walking, not elsewhere classified: Secondary | ICD-10-CM | POA: Insufficient documentation

## 2023-03-15 DIAGNOSIS — M6281 Muscle weakness (generalized): Secondary | ICD-10-CM | POA: Insufficient documentation

## 2023-03-15 DIAGNOSIS — M5459 Other low back pain: Secondary | ICD-10-CM | POA: Diagnosis not present

## 2023-03-15 NOTE — Therapy (Signed)
OUTPATIENT PHYSICAL THERAPY THORACOLUMBAR TREATMENT/   Patient Name: DERREK CAVNESS MRN: [id number] DOB:1933/02/11, 89 y.o., male Today's Date: 03/15/2023  END OF SESSION:  PT End of Session - 03/15/23 1142     Visit Number 12    Number of Visits 24    Date for PT Re-Evaluation 04/14/23    Authorization Type [employer]    Progress Note Due on Visit 10    PT Start Time 1145    PT Stop Time 1225    PT Time Calculation (min) 40 min    Activity Tolerance Patient limited by fatigue;Patient limited by pain    Behavior During Therapy [facility] for tasks assessed/performed               Past Medical History:  Diagnosis Date   Ascending aortic aneurysm (HCC)    a. 12/2020 Echo: Asc Ao 48mm, Ao root 40mm. b. 03/2022 Asc Ao 4.6 cm (4.5 cm in 2019)   CKD (chronic kidney disease), stage III (HCC)    Diastolic dysfunction    a. 12/2020 Echo: EF 50-55%, no rwma, mild LVH, GrI DD, nl RV fxn, mild AI. Asc Ao 48mm, Ao root 40mm.   Elevated prostate specific antigen (PSA)    has been 7 for a year    GERD (gastroesophageal reflux disease)    History of colon polyps 2008   [facility],    History of kidney stones    Hyperlipidemia    Hypertension    Myeloma (HCC)    PAF (paroxysmal atrial fibrillation) (HCC)    a. 01/2021-->Eliquis (CHA2DS2VASc = 3-4).   Pain    Prostate hypertrophy    diagnosed at age 70 due to hematospermia   Past Surgical History:  Procedure Laterality Date   CATARACT EXTRACTION W/PHACO Left 01/10/2018   Procedure: CATARACT EXTRACTION PHACO AND INTRAOCULAR LENS PLACEMENT (IOC);  Surgeon: Galen Manila, MD;  Location: [facility];  Service: Ophthalmology;  Laterality: Left;  [name] 00:24.8 AP% 14.9 CDE 3.68 Fluid pack lot # [id number]   CATARACT EXTRACTION W/PHACO Right 01/25/2018   Procedure: CATARACT EXTRACTION PHACO AND INTRAOCULAR LENS PLACEMENT (IOC);  Surgeon: Galen Manila, MD;  Location: [facility];  Service: Ophthalmology;  Laterality: Right;  [name]  00:42 AP% 10.8 CDE 4.59 Fluid pack lot # [id number]   COLON SURGERY     CYSTOSCOPY W/ URETERAL STENT PLACEMENT Right 10/16/2017   Procedure: right  URETERAL STENT PLACEMENT,cystoscopy bilateral stent removal,rretrograde;  Surgeon: Riki Altes, MD;  Location: [facility];  Service: Urology;  Laterality: Right;   CYSTOSCOPY/URETEROSCOPY/HOLMIUM LASER/STENT PLACEMENT Right 12/16/2020   Procedure: CYSTOSCOPY/URETEROSCOPY/HOLMIUM LASER/STENT PLACEMENT;  Surgeon: Riki Altes, MD;  Location: [facility];  Service: Urology;  Laterality: Right;   EXTRACORPOREAL SHOCK WAVE LITHOTRIPSY Right 12/11/2020   Procedure: EXTRACORPOREAL SHOCK WAVE LITHOTRIPSY (ESWL);  Surgeon: Riki Altes, MD;  Location: [facility];  Service: Urology;  Laterality: Right;   IR BONE TUMOR(S)RF ABLATION  04/16/2022   IR KYPHO EA ADDL LEVEL THORACIC OR LUMBAR  02/02/2021   IR KYPHO LUMBAR INC FX REDUCE BONE BX UNI/BIL CANNULATION INC/IMAGING  02/02/2021   IR KYPHO THORACIC WITH BONE BIOPSY  04/15/2022   KIDNEY STONE SURGERY     KYPHOPLASTY N/A 03/12/2021   Procedure: Nicki Reaper, L3;  Surgeon: Kennedy Bucker, MD;  Location: [facility];  Service: Orthopedics;  Laterality: N/A;   RESECTION SOFT TISSUE TUMOR LEG / ANKLE RADICAL  jan 2009   [facility],  right thigh/knee , nonmalignant   SMALL INTESTINE SURGERY  1946   implaed on picket fence, punctured stomach   TONSILLECTOMY     Patient Active Problem List   Diagnosis Date Noted   Aneurysm of ascending aorta without rupture (HCC) 08/26/2022   Statin myopathy 08/26/2022   Postural dizziness with presyncope 06/11/2022   Pelvic fracture (HCC) 06/10/2022   Multiple myeloma (HCC) 05/05/2021   Compression fracture of body of thoracic vertebra (HCC)    Acute hypoxemic respiratory failure (HCC) 03/26/2021   Right flank pain 03/26/2021   Multiple pathological fractures 03/14/2021   Kappa light chain myeloma (HCC)    Back pain 03/11/2021   Atrial fibrillation (HCC) 03/11/2021   Compression  fracture of L2 and L3 03/11/2021   Hyperlipidemia LDL goal <70    Constipation    Palliative care encounter    Actinic keratosis 02/19/2021   Inguinal hernia 02/19/2021   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   Numbness of foot 02/19/2021   Sciatica 02/19/2021   Shoulder pain 02/19/2021   Palliative care patient 02/17/2021   Closed compression fracture of body of L1 vertebra (HCC) 02/07/2021   Protein-calorie malnutrition, severe 01/31/2021   Unsteadiness on feet 01/20/2021   Chronic kidney disease, stage 3b (HCC) 01/07/2021   Collapsed vertebra, not elsewhere classified, thoracic region, subsequent encounter for fracture with routine healing 01/07/2021   Muscle weakness (generalized) 01/07/2021   Prostate hyperplasia, benign localized, without urinary obstruction 01/07/2021   Nonrheumatic mitral (valve) insufficiency 01/07/2021   Hypokalemia    Normocytic anemia    Atherosclerosis of aorta (HCC)    Irregular heart rhythm    T12 compression fracture (HCC) 01/02/2021   History of kidney stones    Acute kidney injury superimposed on CKD (HCC)    Ureteral stone    Ureteral perforation secondary to stent manipulation 10/16/2017   Abdominal pain 10/16/2017   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/26/2016   Hyperplasia, prostate 02/03/2015   Multinodular goiter 07/03/2014   Thyroid nodule 06/20/2014   Nontoxic uninodular goiter 06/20/2014   Edema 09/24/2013   Encounter for preventive health examination 08/21/2013   Personal history of colonic polyps 08/21/2013   History of venomous snake bite 08/21/2013   Cough 10/02/2012   Prostate hypertrophy    Elevated prostate specific antigen (PSA)    HYPERTRIGLYCERIDEMIA 12/21/2006   Essential hypertension 12/21/2006   GERD 12/21/2006   CALCULUS, KIDNEY 12/21/2006    PCP: Dorothey Baseman, MD   REFERRING PROVIDER: Borders, Daryl Eastern, NP   REFERRING DIAG: C90.00 (ICD-10-CM) - Kappa light chain myeloma (HCC)     Rationale for Evaluation and Treatment: Rehabilitation  THERAPY DIAG:  Muscle weakness (generalized)  Difficulty in walking, not elsewhere classified  Other low back pain  ONSET DATE: >6 months  SUBJECTIVE:  SUBJECTIVE STATEMENT:  Pt reports his pain is 4/10 today, better than most days. Otherwise noting no significant changes since last session.   PERTINENT HISTORY:  89 y.o. male with multiple medical problems including hypertension, CKD stage IIIb, A. fib on Eliquis, and anemia.  Patient was hospitalized 01/02/2021 to 01/07/2021 with T12 compression fracture with recommendation for conservative management including use of a TLSO brace.  Patient was hospitalized again 01/29/2021 to 02/07/2021 with recurrent back pain and MR showing T12 and new L1 compression fracture.  Patient underwent T12/L1 kyphoplasty on 02/02/2021.  There was also suspicion of a left fifth rib fracture, which occurred while transitioning from the stretcher for his kyphoplasty.  Patient had mild hypercalcemia with SPEP and UPEP concerning for possible myeloma.  Patient was referred to [facility] for work-up.  He was readmitted 03/11/2021 - 03/17/2021 with worsening back pain.  CT revealed new compression fractures of L2 and L3.  Patient underwent kyphoplasty on 6/30.  He was hospitalized again 03/26/2021-04/07/2021 with severe back/flank pain.  MRI revealed T10 pathologic compression fracture.  He was started on treatment for multiple myeloma with dexamethasone/Velcade and XRT to spine.   PAIN:  Are you having pain? Yes   PRECAUTIONS: Back for comfort, history of compression fractures   WEIGHT BEARING RESTRICTIONS: No  FALLS:  Has patient fallen in last 6 months? Yes. [place]   PATIENT GOALS: reduce back  pain and improve balance.   NEXT MD VISIT:   OBJECTIVE:   TODAY'S TREATMENT:                                                                                                                              DATE: 03/15/2023 Nustep. Level 3 x 3 min , level 1 x 1 min  Piriformis stretch. 2 x 40 sec bil.   Seated therex:  Red physioball roll outs - anterior, to L and R x 10 ea direction, holding for 2-3 seconds additional x 10 for anterior roll outs. Able to stretch further out with 2nd set done later in the session. Hamstring curl with GTB x 10  Hip abduction GTB x 12 Hip Adduction GTB x 12 with SPT holding band laterally Hip flexion GTB x 8  Ankle DF GTB x 15 bil  Ankle PF GTB x 12 ea side  Although pt reports back pain at 4/10, pt was unable to complete physioball roll outs to L and R side for a 2nd set due to a momentary increase in pain. Pt also required additional cueing for proper form for most exercises listed above.   PATIENT EDUCATION:  Education details: POC. Pt educated throughout session about proper posture and technique with exercises. Improved exercise technique, movement at target joints, use of target muscles after min to mod verbal, visual, tactile cues.  Person educated: Patient Education method: Medical illustrator Education comprehension: verbalized understanding  HOME EXERCISE PROGRAM: Access Code: [email or web address] URL:  [email or web address] Date: 01/28/2023 Prepared by: Grier Rocher  Exercises - Standing Tandem Balance with Counter Support  - 1 x daily - 5 x weekly - 3 sets - 2 reps - 10 hold - Standing Single Leg Stance with Counter Support  - 1 x daily - 5 x weekly - 3 sets - 2 reps - 10 hold - Standing Hip Abduction with Counter Support  - 1 x daily - 5 x weekly - 2 sets - 10 reps - 2 hold  ASSESSMENT:  CLINICAL IMPRESSION:  Patient appeared motivated and ready for treatment on this day. Today worked on a continuation of functional LE  strength exercises per above. Pt noting relief with intermittent stretches between exercise bouts which seemed to keep pt's LBP at a 4/10 throughout session. Additional cueing needed throughout session for appropriate form and posture. Will continue to benefit from interventions moving forward to reduce pain, improve postural control and endurance and improve QoL.    OBJECTIVE IMPAIRMENTS: decreased balance, decreased endurance, decreased mobility, difficulty walking, decreased ROM, decreased strength, hypomobility, impaired perceived functional ability, impaired sensation, improper body mechanics, and postural dysfunction.   ACTIVITY LIMITATIONS: carrying, lifting, standing, squatting, stairs, and locomotion level  PARTICIPATION LIMITATIONS: laundry, shopping, community activity, and yard work  PERSONAL FACTORS: Age and 3+ comorbidities: CA, hypertension, CKD III  are also affecting patient's functional outcome.   REHAB POTENTIAL: Good  CLINICAL DECISION MAKING: Evolving/moderate complexity  EVALUATION COMPLEXITY: Moderate   GOALS: Goals reviewed with patient? No   SHORT TERM GOALS: Target date: 6/120/2024    Patient will be independent in home exercise program to improve strength/mobility for better functional independence with ADLs. Baseline: initiated on 01/20/2023 Goal status: INITIAL   LONG TERM GOALS: Target date: 04/15/2023    Patient will increase FOTO score to equal to or greater than 60   to demonstrate statistically significant improvement in mobility and quality of life.  Baseline: 55 . 6/25: 53  Goal status: IN PROGRESS  2.  Patient (> 31 years old) will complete five times sit to stand test in < 11 seconds indicating an increased LE strength and improved balance. Baseline: 12.9. 6/25: with UE support 13.99 without UE support Goal status: IN PROGRESS  3.  Patient will increase Berg Balance score by > 6 points to demonstrate decreased fall risk during functional  activities Baseline: 43 6/25: 47  Goal status: IN PROGRESS  4.  Patient will increase 10 meter walk test to >1.69m/s as to improve gait speed for better community ambulation and to reduce fall risk. Baseline: 0.78m/s. 6/25 1.63m/s (9.5 sec)  Goal status: MET  5.  Patient will reduce timed up and go to <11 seconds to reduce fall risk and demonstrate improved transfer/gait ability. Baseline: 16.9sec, 6/25 11.17sec  Goal status: IN PROGRESS  6.  Patient will increase FGA >4points as to demonstrate reduced fall risk and improved dynamic gait balance for better safety with community/home ambulation.   Baseline: 22, 6/25: 25 Goal status: IN PROGRESS    PLAN:  PT FREQUENCY: 1-2x/week  PT DURATION: 12 weeks  PLANNED INTERVENTIONS: Therapeutic exercises, Therapeutic activity, Neuromuscular re-education, Balance training, Gait training, Patient/Family education, Self Care, Joint mobilization, Joint manipulation, Stair training, DME instructions, Dry Needling, Spinal mobilization, Moist heat, scar mobilization, and Manual therapy.  PLAN FOR NEXT SESSION:  Balance and BLE strength training for deep core activation.  Improved hip mobility/stretching.  Reassess HEP and adjust as needed.   Gentle therex of low/mid back stabilization.   Cecile Sheerer,  SPT   12:42 PM 03/15/23

## 2023-03-16 ENCOUNTER — Ambulatory Visit (HOSPITAL_BASED_OUTPATIENT_CLINIC_OR_DEPARTMENT_OTHER): Payer: PPO | Admitting: Pain Medicine

## 2023-03-16 ENCOUNTER — Other Ambulatory Visit: Payer: Self-pay | Admitting: Pain Medicine

## 2023-03-16 ENCOUNTER — Encounter: Payer: Self-pay | Admitting: Pain Medicine

## 2023-03-16 ENCOUNTER — Ambulatory Visit: Payer: PPO | Admitting: Physical Therapy

## 2023-03-16 ENCOUNTER — Ambulatory Visit
Admission: RE | Admit: 2023-03-16 | Discharge: 2023-03-16 | Disposition: A | Discharge status: Home / Self Care | Payer: PPO | Attending: Pain Medicine | Admitting: Pain Medicine

## 2023-03-16 ENCOUNTER — Ambulatory Visit
Admission: RE | Admit: 2023-03-16 | Discharge: 2023-03-16 | Disposition: A | Discharge status: Home / Self Care | Payer: PPO | Source: Ambulatory Visit | Attending: Pain Medicine | Admitting: Pain Medicine

## 2023-03-16 VITALS — BP 163/60 | HR 44 | Temp 97.2°F | Resp 14 | Ht 66.0 in | Wt 162.0 lb

## 2023-03-16 DIAGNOSIS — S32030S Wedge compression fracture of third lumbar vertebra, sequela: Secondary | ICD-10-CM

## 2023-03-16 DIAGNOSIS — C9000 Multiple myeloma not having achieved remission: Secondary | ICD-10-CM

## 2023-03-16 DIAGNOSIS — F1190 Opioid use, unspecified, uncomplicated: Secondary | ICD-10-CM | POA: Insufficient documentation

## 2023-03-16 DIAGNOSIS — Z79891 Long term (current) use of opiate analgesic: Secondary | ICD-10-CM

## 2023-03-16 DIAGNOSIS — S32010S Wedge compression fracture of first lumbar vertebra, sequela: Secondary | ICD-10-CM

## 2023-03-16 DIAGNOSIS — Z789 Other specified health status: Secondary | ICD-10-CM | POA: Insufficient documentation

## 2023-03-16 DIAGNOSIS — Z79899 Other long term (current) drug therapy: Secondary | ICD-10-CM | POA: Diagnosis not present

## 2023-03-16 DIAGNOSIS — S32020S Wedge compression fracture of second lumbar vertebra, sequela: Secondary | ICD-10-CM | POA: Insufficient documentation

## 2023-03-16 DIAGNOSIS — M8448XS Pathological fracture, other site, sequela: Secondary | ICD-10-CM

## 2023-03-16 DIAGNOSIS — G8929 Other chronic pain: Secondary | ICD-10-CM | POA: Diagnosis not present

## 2023-03-16 DIAGNOSIS — G894 Chronic pain syndrome: Secondary | ICD-10-CM

## 2023-03-16 DIAGNOSIS — I700 Atherosclerosis of aorta: Secondary | ICD-10-CM | POA: Diagnosis not present

## 2023-03-16 DIAGNOSIS — M899 Disorder of bone, unspecified: Secondary | ICD-10-CM | POA: Insufficient documentation

## 2023-03-16 DIAGNOSIS — S22080S Wedge compression fracture of T11-T12 vertebra, sequela: Secondary | ICD-10-CM

## 2023-03-16 DIAGNOSIS — M8440XS Pathological fracture, unspecified site, sequela: Secondary | ICD-10-CM

## 2023-03-16 DIAGNOSIS — Z7901 Long term (current) use of anticoagulants: Secondary | ICD-10-CM | POA: Insufficient documentation

## 2023-03-16 DIAGNOSIS — Z8579 Personal history of other malignant neoplasms of lymphoid, hematopoietic and related tissues: Secondary | ICD-10-CM

## 2023-03-16 DIAGNOSIS — T50905S Adverse effect of unspecified drugs, medicaments and biological substances, sequela: Secondary | ICD-10-CM

## 2023-03-16 DIAGNOSIS — Z8781 Personal history of (healed) traumatic fracture: Secondary | ICD-10-CM | POA: Insufficient documentation

## 2023-03-16 DIAGNOSIS — M549 Dorsalgia, unspecified: Secondary | ICD-10-CM | POA: Diagnosis not present

## 2023-03-16 DIAGNOSIS — M8580 Other specified disorders of bone density and structure, unspecified site: Secondary | ICD-10-CM | POA: Diagnosis not present

## 2023-03-16 DIAGNOSIS — M47814 Spondylosis without myelopathy or radiculopathy, thoracic region: Secondary | ICD-10-CM | POA: Diagnosis not present

## 2023-03-16 DIAGNOSIS — M545 Low back pain, unspecified: Secondary | ICD-10-CM | POA: Diagnosis not present

## 2023-03-16 NOTE — Patient Instructions (Addendum)
____________________________________________________________________________________________  New Patients  Welcome to [facility].   Initial Visit The first or initial visit consists of an evaluation only.   Interventional pain management.  We offer therapies other than opioid controlled substances to manage chronic pain. These include, but are not limited to, diagnostic, therapeutic, and palliative specialized injection therapies (i.e.: Epidural Steroids, Facet Blocks, etc.). We specialize in a variety of nerve blocks as well as radiofrequency treatments. We offer pain implant evaluations and trials, as well as follow up management. In addition we also provide a variety joint injections, including Viscosupplementation (AKA: Gel Therapy).  Prescription Pain Medication. We specialize in alternatives to opioids. We can provide evaluations and recommendations for/of pharmacologic therapies based on CDC Guidelines.  We no longer take patients for long-term medication management. We will not be taking over your pain medications.  ____________________________________________________________________________________________    ____________________________________________________________________________________________  Patient Information update  To: All of our patients.  Re: Name change.  It has been made official that our current name, "[facility]"   will soon be changed to "[facility]".   The purpose of this change is to eliminate any confusion created by the concept of our practice being a "Medication Management Pain Clinic". In the past this has led to the misconception that we treat pain primarily by the use of prescription medications.  Nothing can be farther from the truth.   Understanding PAIN MANAGEMENT: To  further understand what our practice does, you first have to understand that "Pain Management" is a subspecialty that requires additional training once a physician has completed their specialty training, which can be in either Anesthesia, Neurology, Psychiatry, or Physical Medicine and Rehabilitation (PMR). Each one of these contributes to the final approach taken by each physician to the management of their patient's pain. To be a "Pain Management Specialist" you must have first completed one of the specialty trainings below.  Anesthesiologists - trained in clinical pharmacology and interventional techniques such as nerve blockade and regional as well as central neuroanatomy. They are trained to block pain before, during, and after surgical interventions.  Neurologists - trained in the diagnosis and pharmacological treatment of complex neurological conditions, such as Multiple Sclerosis, Parkinson's, spinal cord injuries, and other systemic conditions that may be associated with symptoms that may include but are not limited to pain. They tend to rely primarily on the treatment of chronic pain using prescription medications.  Psychiatrist - trained in conditions affecting the psychosocial wellbeing of patients including but not limited to depression, anxiety, schizophrenia, personality disorders, addiction, and other substance use disorders that may be associated with chronic pain. They tend to rely primarily on the treatment of chronic pain using prescription medications.   Physical Medicine and Rehabilitation (PMR) physicians, also known as physiatrists - trained to treat a wide variety of medical conditions affecting the brain, spinal cord, nerves, bones, joints, ligaments, muscles, and tendons. Their training is primarily aimed at treating patients that have suffered injuries that have caused severe physical impairment. Their training is primarily aimed at the physical therapy and rehabilitation of those  patients. They may also work alongside orthopedic surgeons or neurosurgeons using their expertise in assisting surgical patients to recover after their surgeries.  INTERVENTIONAL PAIN MANAGEMENT is sub-subspecialty of Pain Management.  Our physicians are Board-certified in Anesthesia, Pain Management, and Interventional Pain Management.  This meaning that not only have they been trained   and Board-certified in their specialty of Anesthesia, and subspecialty of Pain Management, but they have also received further training in the sub-subspecialty of Interventional Pain Management, in order to become Board-certified as INTERVENTIONAL PAIN MANAGEMENT SPECIALIST.    Mission: Our goal is to use our skills in  INTERVENTIONAL PAIN MANAGEMENT as alternatives to the chronic use of prescription opioid medications for the treatment of pain. To make this more clear, we have changed our name to reflect what we do and offer. We will continue to offer medication management assessment and recommendations, but we will not be taking over any patient's medication management.  ____________________________________________________________________________________________    ____________________________________________________________________________________________  Opioid Pain Medication Update  To: All patients taking opioid pain medications. (I.e.: hydrocodone, hydromorphone, oxycodone, oxymorphone, morphine, codeine, methadone, tapentadol, tramadol, buprenorphine, fentanyl, etc.)  Re: Updated review of side effects and adverse reactions of opioid analgesics, as well as new information about long term effects of this class of medications.  Direct risks of long-term opioid therapy are not limited to opioid addiction and overdose. Potential medical risks include serious fractures, breathing problems during sleep, hyperalgesia, immunosuppression, chronic constipation, bowel obstruction, myocardial infarction, and tooth  decay secondary to xerostomia.  Unpredictable adverse effects that can occur even if you take your medication correctly: Cognitive impairment, respiratory depression, and death. Most people think that if they take their medication "correctly", and "as instructed", that they will be safe. Nothing could be farther from the truth. In reality, a significant amount of recorded deaths associated with the use of opioids has occurred in individuals that had taken the medication for a long time, and were taking their medication correctly. The following are examples of how this can happen: Patient taking his/her medication for a long time, as instructed, without any side effects, is given a certain antibiotic or another unrelated medication, which in turn triggers a "Drug-to-drug interaction" leading to disorientation, cognitive impairment, impaired reflexes, respiratory depression or an untoward event leading to serious bodily harm or injury, including death.  Patient taking his/her medication for a long time, as instructed, without any side effects, develops an acute impairment of liver and/or kidney function. This will lead to a rapid inability of the body to breakdown and eliminate their pain medication, which will result in effects similar to an "overdose", but with the same medicine and dose that they had always taken. This again may lead to disorientation, cognitive impairment, impaired reflexes, respiratory depression or an untoward event leading to serious bodily harm or injury, including death.  A similar problem will occur with patients as they grow older and their liver and kidney function begins to decrease as part of the aging process.  Background information: Historically, the original case for using long-term opioid therapy to treat chronic noncancer pain was based on safety assumptions that subsequent experience has called into question. In 1996, the American Pain Society and the American Academy of  Pain Medicine issued a consensus statement supporting long-term opioid therapy. This statement acknowledged the dangers of opioid prescribing but concluded that the risk for addiction was low; respiratory depression induced by opioids was short-lived, occurred mainly in opioid-naive patients, and was antagonized by pain; tolerance was not a common problem; and efforts to control diversion should not constrain opioid prescribing. This has now proven to be wrong. Experience regarding the risks for opioid addiction, misuse, and overdose in community practice has failed to support these assumptions.  According to the Centers for Disease Control and Prevention, fatal overdoses involving opioid analgesics have  increased sharply over the past decade. Currently, more than 96,700 people die from drug overdoses every year. Opioids are a factor in 7 out of every 10 overdose deaths. Deaths from drug overdose have surpassed motor vehicle accidents as the leading cause of death for individuals between the ages of 79 and 76.  Clinical data suggest that neuroendocrine dysfunction may be very common in both men and women, potentially causing hypogonadism, erectile dysfunction, infertility, decreased libido, osteoporosis, and depression. Recent studies linked higher opioid dose to increased opioid-related mortality. Controlled observational studies reported that long-term opioid therapy may be associated with increased risk for cardiovascular events. Subsequent meta-analysis concluded that the safety of long-term opioid therapy in elderly patients has not been proven.   Side Effects and adverse reactions: Common side effects: Drowsiness (sedation). Dizziness. Nausea and vomiting. Constipation. Physical dependence -- Dependence often manifests with withdrawal symptoms when opioids are discontinued or decreased. Tolerance -- As you take repeated doses of opioids, you require increased medication to experience the same  effect of pain relief. Respiratory depression -- This can occur in healthy people, especially with higher doses. However, people with COPD, asthma or other lung conditions may be even more susceptible to fatal respiratory impairment.  Uncommon side effects: An increased sensitivity to feeling pain and extreme response to pain (hyperalgesia). Chronic use of opioids can lead to this. Delayed gastric emptying (the process by which the contents of your stomach are moved into your small intestine). Muscle rigidity. Immune system and hormonal dysfunction. Quick, involuntary muscle jerks (myoclonus). Arrhythmia. Itchy skin (pruritus). Dry mouth (xerostomia).  Long-term side effects: Chronic constipation. Sleep-disordered breathing (SDB). Increased risk of bone fractures. Hypothalamic-pituitary-adrenal dysregulation. Increased risk of overdose.  RISKS: Fractures and Falls:  Opioids increase the risk and incidence of falls. This is of particular importance in elderly patients.  Endocrine System:  Long-term administration is associated with endocrine abnormalities (endocrinopathies). (Also known as Opioid-induced Endocrinopathy) Influences on both the hypothalamic-pituitary-adrenal axis?and the hypothalamic-pituitary-gonadal axis have been demonstrated with consequent hypogonadism and adrenal insufficiency in both sexes. Hypogonadism and decreased levels of dehydroepiandrosterone sulfate have been reported in men and women. Endocrine effects include: Amenorrhoea in women (abnormal absence of menstruation) Reduced libido in both sexes Decreased sexual function Erectile dysfunction in men Hypogonadisms (decreased testicular function with shrinkage of testicles) Infertility Depression and fatigue Loss of muscle mass Anxiety Depression Immune suppression Hyperalgesia Weight gain Anemia Osteoporosis Patients (particularly women of childbearing age) should avoid opioids. There is  insufficient evidence to recommend routine monitoring of asymptomatic patients taking opioids in the long-term for hormonal deficiencies.  Immune System: Human studies have demonstrated that opioids have an immunomodulating effect. These effects are mediated via opioid receptors both on immune effector cells and in the central nervous system. Opioids have been demonstrated to have adverse effects on antimicrobial response and anti-tumour surveillance. Buprenorphine has been demonstrated to have no impact on immune function.  Opioid Induced Hyperalgesia: Human studies have demonstrated that prolonged use of opioids can lead to a state of abnormal pain sensitivity, sometimes called opioid induced hyperalgesia (OIH). Opioid induced hyperalgesia is not usually seen in the absence of tolerance to opioid analgesia. Clinically, hyperalgesia may be diagnosed if the patient on long-term opioid therapy presents with increased pain. This might be qualitatively and anatomically distinct from pain related to disease progression or to breakthrough pain resulting from development of opioid tolerance. Pain associated with hyperalgesia tends to be more diffuse than the pre-existing pain and less defined in quality. Management of opioid induced  hyperalgesia requires opioid dose reduction.  Cancer: Chronic opioid therapy has been associated with an increased risk of cancer among noncancer patients with chronic pain. This association was more evident in chronic strong opioid users. Chronic opioid consumption causes significant pathological changes in the small intestine and colon. Epidemiological studies have found that there is a link between opium dependence and initiation of gastrointestinal cancers. Cancer is the second leading cause of death after cardiovascular disease. Chronic use of opioids can cause multiple conditions such as GERD, immunosuppression and renal damage as well as carcinogenic effects, which are  associated with the incidence of cancers.   Mortality: Long-term opioid use has been associated with increased mortality among patients with chronic non-cancer pain (CNCP).  Prescription of long-acting opioids for chronic noncancer pain was associated with a significantly increased risk of all-cause mortality, including deaths from causes other than overdose.  Reference: Von Korff M, Kolodny A, Deyo RA, Chou R. Long-term opioid therapy reconsidered. Ann Intern Med. 2011 Sep 6;155(5):325-8. doi: 10.7326/[id number]. PMID: 16109604; PMCID: [id number]. Randon Goldsmith, Hayward RA, Dunn KM, [place] KP. Risk of adverse events in patients prescribed long-term opioids: A cohort study in the [place] [facility] [employer]. Eur J Pain. 2019 May;23(5):908-922. doi: 10.1002/ejp.1357. Epub 2019 Jan 31. PMID: 78295621. Colameco S, Coren JS, [place] CA. Continuous opioid treatment for chronic noncancer pain: a time for moderation in prescribing. Postgrad Med. 2009 Jul;121(4):61-6. doi: 10.3810/pgm.2009.07.2032. PMID: 30865784. William Hamburger RN, [place], Blazina I, Cristopher Peru, Bougatsos C, Deyo RA. The effectiveness and risks of long-term opioid therapy for chronic pain: a systematic review for a [employer] Pathways to [employer]. Ann Intern Med. 2015 Feb 17;162(4):276-86. doi: 10.7326/M14-2559. PMID: 69629528. Caryl Bis [facility], [place] DM. NCHS Data Brief No. 22. [place]: Centers for Disease Control and Prevention; 2009. Sep, Increase in Fatal Poisonings Involving Opioid Analgesics in the [place], 1999-2006. Song IA, Choi HR, Oh TK. Long-term opioid use and mortality in patients with chronic non-cancer pain: Ten-year follow-up study in [place] from 2010 through 2019. EClinicalMedicine. 2022 Jul 18;51:101558. doi: 10.1016/j.eclinm.2022.413244. PMID: 01027253; PMCID: [id number]. Huser, W., Schubert, T., Vogelmann, T. et  al. All-cause mortality in patients with long-term opioid therapy compared with non-opioid analgesics for chronic non-cancer pain: a database study. BMC Med 18, 162 (2020). http://lester.info/ Rashidian H, Karie Kirks, Malekzadeh R, Haghdoost AA. An Ecological Study of the Association between Opiate Use and Incidence of Cancers. Addict Health. 2016 Fall;8(4):252-260. PMID: 59563875; PMCID: [id number].  Our Goal: Our goal is to control your pain with means other than the use of opioid pain medications.  Our Recommendation: Talk to your physician about coming off of these medications. We can assist you with the tapering down and stopping these medicines. Based on the new information, even if you cannot completely stop the medication, a decrease in the dose may be associated with a lesser risk. Ask for other means of controlling the pain. Decrease or eliminate those factors that significantly contribute to your pain such as smoking, obesity, and a diet heavily tilted towards "inflammatory" nutrients.  Last Updated: 11/10/2022   ____________________________________________________________________________________________

## 2023-03-17 LAB — C-REACTIVE PROTEIN: CRP: 22 mg/L — ABNORMAL HIGH (ref 0–10)

## 2023-03-17 LAB — SEDIMENTATION RATE: Sed Rate: 61 mm/hr — ABNORMAL HIGH (ref 0–30)

## 2023-03-18 ENCOUNTER — Encounter: Payer: Self-pay | Admitting: Oncology

## 2023-03-21 ENCOUNTER — Encounter: Payer: Self-pay | Admitting: Pain Medicine

## 2023-03-21 ENCOUNTER — Ambulatory Visit: Payer: PPO | Admitting: Physical Therapy

## 2023-03-21 DIAGNOSIS — R700 Elevated erythrocyte sedimentation rate: Secondary | ICD-10-CM | POA: Insufficient documentation

## 2023-03-21 DIAGNOSIS — R7982 Elevated C-reactive protein (CRP): Secondary | ICD-10-CM | POA: Insufficient documentation

## 2023-03-22 ENCOUNTER — Other Ambulatory Visit: Payer: Self-pay | Admitting: Thoracic Surgery (Cardiothoracic Vascular Surgery)

## 2023-03-22 DIAGNOSIS — I7121 Aneurysm of the ascending aorta, without rupture: Secondary | ICD-10-CM

## 2023-03-23 ENCOUNTER — Ambulatory Visit: Payer: PPO | Admitting: Physical Therapy

## 2023-03-28 ENCOUNTER — Ambulatory Visit: Payer: Self-pay | Admitting: *Deleted

## 2023-03-28 ENCOUNTER — Other Ambulatory Visit: Payer: Self-pay | Admitting: *Deleted

## 2023-03-28 DIAGNOSIS — C9000 Multiple myeloma not having achieved remission: Secondary | ICD-10-CM

## 2023-03-28 MED ORDER — LENALIDOMIDE 20 MG PO CAPS
20.0000 mg | ORAL_CAPSULE | Freq: Every day | ORAL | 0 refills | Status: DC
Start: 2023-03-28 — End: 2023-04-13

## 2023-03-28 NOTE — Patient Outreach (Signed)
  Care Coordination   Follow Up Visit Note   03/28/2023 Name: Jack [name] MRN: [id number] DOB: 1933/04/21  Jack [name] is a 89 y.o. year old male who sees Dorothey Baseman, MD for primary care. I spoke with  Jack [name] by phone today.  What matters to the patients health and wellness today?  Treatment plan for back pain    Goals Addressed             This Visit's Progress    Effective management of back pain       Care Coordination Interventions: Reviewed provider established plan for pain management Discussed importance of adherence to all scheduled medical appointments Counseled on the importance of reporting any/all new or changed pain symptoms or management strategies to pain management provider Discussed use of relaxation techniques and/or diversional activities to assist with pain reduction (distraction, imagery, relaxation, massage, acupressure, TENS, heat, and cold application Reviewed with patient prescribed pharmacological and nonpharmacological pain relief strategies          SDOH assessments and interventions completed:  No     Care Coordination Interventions:  Yes, provided   Interventions Today    Flowsheet Row Most Recent Value  Chronic Disease   Chronic disease during today's visit Other  [back pain]  General Interventions   General Interventions Discussed/Reviewed General Interventions Reviewed, Doctor Visits  Doctor Visits Discussed/Reviewed Doctor Visits Reviewed, Specialist  [Will restart outpatient PT tomorrow, Dietician 7/19, and pain specialsit 7/31]  PCP/Specialist Visits Compliance with follow-up visit  [Discussed treatment of back pain with steroid injection, will know for sure if this is the plan on 7/31]  Education Interventions   Education Provided Provided Education  Provided Verbal Education On Medication, Other, When to see the doctor       Follow up plan: Follow up call scheduled for 8/12    Encounter Outcome:  Pt.  Visit Completed   Kemper Durie, RN, MSN, [facility] Care Management Care Management Coordinator [phone]

## 2023-03-30 ENCOUNTER — Ambulatory Visit: Payer: PPO | Admitting: Physical Therapy

## 2023-03-30 DIAGNOSIS — C9000 Multiple myeloma not having achieved remission: Secondary | ICD-10-CM | POA: Diagnosis not present

## 2023-03-30 DIAGNOSIS — G893 Neoplasm related pain (acute) (chronic): Secondary | ICD-10-CM | POA: Diagnosis not present

## 2023-03-30 DIAGNOSIS — Z515 Encounter for palliative care: Secondary | ICD-10-CM | POA: Diagnosis not present

## 2023-03-30 DIAGNOSIS — M6281 Muscle weakness (generalized): Secondary | ICD-10-CM

## 2023-03-30 DIAGNOSIS — R262 Difficulty in walking, not elsewhere classified: Secondary | ICD-10-CM

## 2023-03-30 DIAGNOSIS — M5459 Other low back pain: Secondary | ICD-10-CM

## 2023-03-30 NOTE — Therapy (Signed)
OUTPATIENT PHYSICAL THERAPY THORACOLUMBAR TREATMENT/   Patient Name: Jack [name] MRN: [id number] DOB:1932/10/08, 89 y.o., male Today's Date: 03/30/2023  END OF SESSION:  PT End of Session - 03/30/23 1013     Visit Number 13    Number of Visits 24    Date for PT Re-Evaluation 04/14/23    Authorization Type [employer]    Progress Note Due on Visit 20    PT Start Time 1016    PT Stop Time 1058    PT Time Calculation (min) 42 min    Activity Tolerance Patient limited by fatigue;Patient limited by pain    Behavior During Therapy [facility] for tasks assessed/performed               Past Medical History:  Diagnosis Date   Ascending aortic aneurysm (HCC)    a. 12/2020 Echo: Asc Ao 48mm, Ao root 40mm. b. 03/2022 Asc Ao 4.6 cm (4.5 cm in 2019)   CKD (chronic kidney disease), stage III (HCC)    Compression fracture of body of thoracic vertebra (HCC)    Diastolic dysfunction    a. 12/2020 Echo: EF 50-55%, no rwma, mild LVH, GrI DD, nl RV fxn, mild AI. Asc Ao 48mm, Ao root 40mm.   Elevated prostate specific antigen (PSA)    has been 7 for a year    GERD (gastroesophageal reflux disease)    History of colon polyps 2008   [facility],    History of kidney stones    Hyperlipidemia    Hypertension    Myeloma (HCC)    PAF (paroxysmal atrial fibrillation) (HCC)    a. 01/2021-->Eliquis (CHA2DS2VASc = 3-4).   Pain    Prostate hypertrophy    diagnosed at age 23 due to hematospermia   Past Surgical History:  Procedure Laterality Date   CATARACT EXTRACTION W/PHACO Left 01/10/2018   Procedure: CATARACT EXTRACTION PHACO AND INTRAOCULAR LENS PLACEMENT (IOC);  Surgeon: Galen Manila, MD;  Location: [facility] ORS;  Service: Ophthalmology;  Laterality: Left;  [name] 00:24.8 AP% 14.9 CDE 3.68 Fluid pack lot # [id number]   CATARACT EXTRACTION W/PHACO Right 01/25/2018   Procedure: CATARACT EXTRACTION PHACO AND INTRAOCULAR LENS PLACEMENT (IOC);  Surgeon: Galen Manila, MD;   Location: [facility] ORS;  Service: Ophthalmology;  Laterality: Right;  [name] 00:42 AP% 10.8 CDE 4.59 Fluid pack lot # [id number]   COLON SURGERY     CYSTOSCOPY W/ URETERAL STENT PLACEMENT Right 10/16/2017   Procedure: right  URETERAL STENT PLACEMENT,cystoscopy bilateral stent removal,rretrograde;  Surgeon: Riki Altes, MD;  Location: [facility] ORS;  Service: Urology;  Laterality: Right;   CYSTOSCOPY/URETEROSCOPY/HOLMIUM LASER/STENT PLACEMENT Right 12/16/2020   Procedure: CYSTOSCOPY/URETEROSCOPY/HOLMIUM LASER/STENT PLACEMENT;  Surgeon: Riki Altes, MD;  Location: [facility] ORS;  Service: Urology;  Laterality: Right;   EXTRACORPOREAL SHOCK WAVE LITHOTRIPSY Right 12/11/2020   Procedure: EXTRACORPOREAL SHOCK WAVE LITHOTRIPSY (ESWL);  Surgeon: Riki Altes, MD;  Location: [facility] ORS;  Service: Urology;  Laterality: Right;   IR BONE TUMOR(S)RF ABLATION  04/16/2022   IR KYPHO EA ADDL LEVEL THORACIC OR LUMBAR  02/02/2021   IR KYPHO LUMBAR INC FX REDUCE BONE BX UNI/BIL CANNULATION INC/IMAGING  02/02/2021   IR KYPHO THORACIC WITH BONE BIOPSY  04/15/2022   KIDNEY STONE SURGERY     KYPHOPLASTY N/A 03/12/2021   Procedure: Nicki Reaper, L3;  Surgeon: Kennedy Bucker, MD;  Location: [facility] ORS;  Service: Orthopedics;  Laterality: N/A;   RESECTION SOFT TISSUE TUMOR LEG / ANKLE RADICAL  jan 2009  [facility],  right thigh/knee , nonmalignant   SMALL INTESTINE SURGERY  1946   implaed on picket fence, punctured stomach   TONSILLECTOMY     Patient Active Problem List   Diagnosis Date Noted   Elevated sed rate 03/21/2023   Elevated C-reactive protein (CRP) 03/21/2023   Chronic pain syndrome 03/16/2023   Pharmacologic therapy 03/16/2023   Disorder of skeletal system 03/16/2023   Problems influencing health status 03/16/2023   History of vertebral fracture 03/16/2023   Compression fracture of L3 lumbar vertebra, sequela 03/16/2023   Compression fracture of T11 vertebra, sequela 03/16/2023   Pathologic lumbar vertebral fracture,  sequela 03/16/2023   Pathologic fracture of thoracic vertebrae, sequela 03/16/2023   Personal history of multiple myeloma 03/16/2023   Drug tolerance, sequela 03/16/2023   Physical tolerance to opiate drug 03/16/2023   Chronic anticoagulation (Eliquis) 03/16/2023   Opiate use (180 MME/day) 03/16/2023   Aneurysm of ascending aorta without rupture (HCC) 08/26/2022   Statin myopathy 08/26/2022   Postural dizziness with presyncope 06/11/2022   Pelvic fracture (HCC) 06/10/2022   Multiple myeloma (HCC) 05/05/2021   Acute hypoxemic respiratory failure (HCC) 03/26/2021   Right flank pain 03/26/2021   Multiple pathological fractures 03/14/2021   Kappa light chain myeloma (HCC)    Back pain 03/11/2021   Atrial fibrillation (HCC) 03/11/2021   Compression fracture of L2 lumbar vertebra, sequela 03/11/2021   Hyperlipidemia LDL goal <70    Constipation    Palliative care encounter    Actinic keratosis 02/19/2021   Inguinal hernia 02/19/2021   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   Numbness of foot 02/19/2021   Sciatica 02/19/2021   Shoulder pain 02/19/2021   Palliative care patient 02/17/2021   Compression fracture of L1 lumbar vertebra, sequela 02/07/2021   Protein-calorie malnutrition, severe 01/31/2021   Unsteadiness on feet 01/20/2021   Chronic kidney disease, stage 3b (HCC) 01/07/2021   Collapsed vertebra, not elsewhere classified, thoracic region, subsequent encounter for fracture with routine healing 01/07/2021   Muscle weakness (generalized) 01/07/2021   Prostate hyperplasia, benign localized, without urinary obstruction 01/07/2021   Nonrheumatic mitral (valve) insufficiency 01/07/2021   Hypokalemia    Normocytic anemia    Atherosclerosis of aorta (HCC)    Irregular heart rhythm    Compression fracture of T12 vertebra, sequela 01/02/2021   History of kidney stones    Acute kidney injury superimposed on CKD (HCC)    Ureteral stone    Ureteral perforation  secondary to stent manipulation 10/16/2017   Abdominal pain 10/16/2017   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/26/2016   Hyperplasia, prostate 02/03/2015   Multinodular goiter 07/03/2014   Thyroid nodule 06/20/2014   Nontoxic uninodular goiter 06/20/2014   Edema 09/24/2013   Encounter for preventive health examination 08/21/2013   Personal history of colonic polyps 08/21/2013   History of venomous snake bite 08/21/2013   Cough 10/02/2012   Prostate hypertrophy    Elevated prostate specific antigen (PSA)    HYPERTRIGLYCERIDEMIA 12/21/2006   Essential hypertension 12/21/2006   GERD 12/21/2006   CALCULUS, KIDNEY 12/21/2006    PCP: Dorothey Baseman, MD   REFERRING PROVIDER: Borders, Daryl Eastern, NP   REFERRING DIAG: C90.00 (ICD-10-CM) - Kappa light chain myeloma (HCC)    Rationale for Evaluation and Treatment: Rehabilitation  THERAPY DIAG:  Muscle weakness (generalized)  Difficulty in walking, not elsewhere classified  Other low back pain  ONSET DATE: >6 months  SUBJECTIVE:  SUBJECTIVE STATEMENT:  Pt reports doing well today. Pt denies any recent falls/stumbles since prior session. Pt denies any updates to medications or medical appointment since prior session. Pt reports good compliance with HEP when time permits.     PERTINENT HISTORY:  89 y.o. male with multiple medical problems including hypertension, CKD stage IIIb, A. fib on Eliquis, and anemia.  Patient was hospitalized 01/02/2021 to 01/07/2021 with T12 compression fracture with recommendation for conservative management including use of a TLSO brace.  Patient was hospitalized again 01/29/2021 to 02/07/2021 with recurrent back pain and MR showing T12 and new L1 compression fracture.  Patient underwent T12/L1 kyphoplasty on 02/02/2021.  There was also  suspicion of a left fifth rib fracture, which occurred while transitioning from the stretcher for his kyphoplasty.  Patient had mild hypercalcemia with SPEP and UPEP concerning for possible myeloma.  Patient was referred to [facility] for work-up.  He was readmitted 03/11/2021 - 03/17/2021 with worsening back pain.  CT revealed new compression fractures of L2 and L3.  Patient underwent kyphoplasty on 6/30.  He was hospitalized again 03/26/2021-04/07/2021 with severe back/flank pain.  MRI revealed T10 pathologic compression fracture.  He was started on treatment for multiple myeloma with dexamethasone/Velcade and XRT to spine.   PAIN:  Are you having pain? Yes   PRECAUTIONS: Back for comfort, history of compression fractures   WEIGHT BEARING RESTRICTIONS: No  FALLS:  Has patient fallen in last 6 months? Yes. [place]   PATIENT GOALS: reduce back pain and improve balance.   NEXT MD VISIT:   OBJECTIVE:   TODAY'S TREATMENT:                                                                                                                              DATE: 03/30/2023  Seated therex:   Hamstring curl with GTB x 10   Ankle PF 4#AW  Seated LAQ 2 x 10 ea LE with 4# AW  Seated march 2 x 10 ea LE   3 rounds af ambulation with 4# AW and SPC x 170 ft between the above seated therex   NMR:   Split stance holds 2 x 30 sec ea LE   Airex NBOS stance x 45 sec   Airex forward step on / off x 10 leading with ea LE   SLS practice 2 x 45 sec ea LE with intermittent UE support able to hold approx 20 sec on L and 15 sec on R by last attempt   Unless otherwise stated, CGA was provided and gait belt donned in order to ensure pt safety   PATIENT EDUCATION:  Education details: POC. Pt educated throughout session about proper posture and technique with exercises. Improved exercise technique, movement at target joints, use of target muscles after min to mod verbal, visual,  tactile cues.  Person educated: Patient Education method: Medical illustrator Education comprehension: verbalized understanding  HOME  EXERCISE PROGRAM: Access Code: [email or web address] URL: [email or web address] Date: 01/28/2023 Prepared by: Grier Rocher  Exercises - Standing Tandem Balance with Counter Support  - 1 x daily - 5 x weekly - 3 sets - 2 reps - 10 hold - Standing Single Leg Stance with Counter Support  - 1 x daily - 5 x weekly - 3 sets - 2 reps - 10 hold - Standing Hip Abduction with Counter Support  - 1 x daily - 5 x weekly - 2 sets - 10 reps - 2 hold  ASSESSMENT:  CLINICAL IMPRESSION:  Patient appeared motivated and ready for treatment on this day. Today worked on a continuation of functional LE strength exercises per above. Pt progressed with ambulatory, LE strength, and with balance and neuromuscular re-education tasks this date. Will continue to benefit from interventions moving forward to reduce pain, improve postural control and endurance and improve QoL.    OBJECTIVE IMPAIRMENTS: decreased balance, decreased endurance, decreased mobility, difficulty walking, decreased ROM, decreased strength, hypomobility, impaired perceived functional ability, impaired sensation, improper body mechanics, and postural dysfunction.   ACTIVITY LIMITATIONS: carrying, lifting, standing, squatting, stairs, and locomotion level  PARTICIPATION LIMITATIONS: laundry, shopping, community activity, and yard work  PERSONAL FACTORS: Age and 3+ comorbidities: CA, hypertension, CKD III  are also affecting patient's functional outcome.   REHAB POTENTIAL: Good  CLINICAL DECISION MAKING: Evolving/moderate complexity  EVALUATION COMPLEXITY: Moderate   GOALS: Goals reviewed with patient? No   SHORT TERM GOALS: Target date: 6/120/2024    Patient will be independent in home exercise program to improve strength/mobility for better functional independence with ADLs. Baseline:  initiated on 01/20/2023 Goal status: INITIAL   LONG TERM GOALS: Target date: 04/15/2023    Patient will increase FOTO score to equal to or greater than 60   to demonstrate statistically significant improvement in mobility and quality of life.  Baseline: 55 . 6/25: 53  Goal status: IN PROGRESS  2.  Patient (> 3 years old) will complete five times sit to stand test in < 11 seconds indicating an increased LE strength and improved balance. Baseline: 12.9. 6/25: with UE support 13.99 without UE support Goal status: IN PROGRESS  3.  Patient will increase Berg Balance score by > 6 points to demonstrate decreased fall risk during functional activities Baseline: 43 6/25: 47  Goal status: IN PROGRESS  4.  Patient will increase 10 meter walk test to >1.42m/s as to improve gait speed for better community ambulation and to reduce fall risk. Baseline: 0.27m/s. 6/25 1.70m/s (9.5 sec)  Goal status: MET  5.  Patient will reduce timed up and go to <11 seconds to reduce fall risk and demonstrate improved transfer/gait ability. Baseline: 16.9sec, 6/25 11.17sec  Goal status: IN PROGRESS  6.  Patient will increase FGA >4points as to demonstrate reduced fall risk and improved dynamic gait balance for better safety with community/home ambulation.   Baseline: 22, 6/25: 25 Goal status: IN PROGRESS    PLAN:  PT FREQUENCY: 1-2x/week  PT DURATION: 12 weeks  PLANNED INTERVENTIONS: Therapeutic exercises, Therapeutic activity, Neuromuscular re-education, Balance training, Gait training, Patient/Family education, Self Care, Joint mobilization, Joint manipulation, Stair training, DME instructions, Dry Needling, Spinal mobilization, Moist heat, scar mobilization, and Manual therapy.  PLAN FOR NEXT SESSION:  Balance and BLE strength training for deep core activation.  Improved hip mobility/stretching.  Reassess HEP and adjust as needed.   Gentle therex of low/mid back stabilization.   Norman Herrlich PT  ,DPT Physical Therapist- [facility]  [facility]    3:57 PM 03/30/23

## 2023-03-31 ENCOUNTER — Encounter: Payer: Self-pay | Admitting: Oncology

## 2023-04-01 ENCOUNTER — Inpatient Hospital Stay: Payer: PPO

## 2023-04-01 ENCOUNTER — Emergency Department: Payer: PPO

## 2023-04-01 ENCOUNTER — Encounter: Payer: Self-pay | Admitting: Oncology

## 2023-04-01 ENCOUNTER — Emergency Department
Admission: EM | Admit: 2023-04-01 | Discharge: 2023-04-01 | Disposition: A | Discharge status: Home / Self Care | Payer: PPO | Source: Home / Self Care | Attending: Emergency Medicine | Admitting: Emergency Medicine

## 2023-04-01 ENCOUNTER — Ambulatory Visit: Payer: PPO | Admitting: Physical Therapy

## 2023-04-01 ENCOUNTER — Other Ambulatory Visit: Payer: Self-pay

## 2023-04-01 DIAGNOSIS — L03211 Cellulitis of face: Secondary | ICD-10-CM | POA: Diagnosis not present

## 2023-04-01 DIAGNOSIS — M272 Inflammatory conditions of jaws: Secondary | ICD-10-CM | POA: Insufficient documentation

## 2023-04-01 DIAGNOSIS — Z8579 Personal history of other malignant neoplasms of lymphoid, hematopoietic and related tissues: Secondary | ICD-10-CM | POA: Insufficient documentation

## 2023-04-01 DIAGNOSIS — R7881 Bacteremia: Secondary | ICD-10-CM | POA: Diagnosis not present

## 2023-04-01 DIAGNOSIS — R9431 Abnormal electrocardiogram [ECG] [EKG]: Secondary | ICD-10-CM | POA: Diagnosis not present

## 2023-04-01 DIAGNOSIS — C9000 Multiple myeloma not having achieved remission: Secondary | ICD-10-CM | POA: Diagnosis not present

## 2023-04-01 DIAGNOSIS — K122 Cellulitis and abscess of mouth: Secondary | ICD-10-CM

## 2023-04-01 DIAGNOSIS — R220 Localized swelling, mass and lump, head: Secondary | ICD-10-CM | POA: Diagnosis not present

## 2023-04-01 DIAGNOSIS — R221 Localized swelling, mass and lump, neck: Secondary | ICD-10-CM | POA: Diagnosis not present

## 2023-04-01 LAB — COMPREHENSIVE METABOLIC PANEL
ALT: 14 U/L (ref 0–44)
AST: 17 U/L (ref 15–41)
Albumin: 3.3 g/dL — ABNORMAL LOW (ref 3.5–5.0)
Alkaline Phosphatase: 88 U/L (ref 38–126)
Anion gap: 11 (ref 5–15)
BUN: 28 mg/dL — ABNORMAL HIGH (ref 8–23)
CO2: 26 mmol/L (ref 22–32)
Calcium: 8.3 mg/dL — ABNORMAL LOW (ref 8.9–10.3)
Chloride: 103 mmol/L (ref 98–111)
Creatinine, Ser: 1.13 mg/dL (ref 0.61–1.24)
GFR, Estimated: >60 mL/min (ref >60)
Glucose, Bld: 122 mg/dL — ABNORMAL HIGH (ref 70–99)
Potassium: 4.3 mmol/L (ref 3.5–5.1)
Sodium: 140 mmol/L (ref 135–145)
Total Bilirubin: 1.1 mg/dL (ref 0.3–1.2)
Total Protein: 6.1 g/dL — ABNORMAL LOW (ref 6.5–8.1)

## 2023-04-01 LAB — CBC WITH DIFFERENTIAL/PLATELET
Abs Immature Granulocytes: 0.02 10*3/uL (ref 0.00–0.07)
Basophils Absolute: 0 10*3/uL (ref 0.0–0.1)
Basophils Relative: 1 %
Eosinophils Absolute: 0.1 10*3/uL (ref 0.0–0.5)
Eosinophils Relative: 2 %
HCT: 35 % — ABNORMAL LOW (ref 39.0–52.0)
Hemoglobin: 11.3 g/dL — ABNORMAL LOW (ref 13.0–17.0)
Immature Granulocytes: 0 %
Lymphocytes Relative: 18 %
Lymphs Abs: 1.1 10*3/uL (ref 0.7–4.0)
MCH: 32.6 pg (ref 26.0–34.0)
MCHC: 32.3 g/dL (ref 30.0–36.0)
MCV: 100.9 fL — ABNORMAL HIGH (ref 80.0–100.0)
Monocytes Absolute: 0.9 10*3/uL (ref 0.1–1.0)
Monocytes Relative: 15 %
Neutro Abs: 3.8 10*3/uL (ref 1.7–7.7)
Neutrophils Relative %: 64 %
Platelets: 81 10*3/uL — ABNORMAL LOW (ref 150–400)
RBC: 3.47 MIL/uL — ABNORMAL LOW (ref 4.22–5.81)
RDW: 17.8 % — ABNORMAL HIGH (ref 11.5–15.5)
WBC: 5.9 10*3/uL (ref 4.0–10.5)
nRBC: 0 % (ref 0.0–0.2)

## 2023-04-01 MED ORDER — MORPHINE SULFATE (PF) 4 MG/ML IV SOLN
4.0000 mg | Freq: Once | INTRAVENOUS | Status: AC
  Administered 2023-04-01: 4 mg via INTRAVENOUS

## 2023-04-01 MED ORDER — CLINDAMYCIN HCL 300 MG PO CAPS: 300.0000 mg | ORAL_CAPSULE | Freq: Three times a day (TID) | ORAL | 0 refills | Status: DC

## 2023-04-01 MED ORDER — SODIUM CHLORIDE 0.9 % IV SOLN
3.0000 g | Freq: Once | INTRAVENOUS | Status: AC
  Administered 2023-04-01: 3 g via INTRAVENOUS

## 2023-04-01 MED ORDER — IOHEXOL 300 MG/ML  SOLN
75.0000 mL | Freq: Once | INTRAMUSCULAR | Status: AC | PRN
  Administered 2023-04-01: 75 mL via INTRAVENOUS

## 2023-04-01 MED ORDER — VANCOMYCIN HCL IN DEXTROSE 1-5 GM/200ML-% IV SOLN: 1000.0000 mg | Freq: Once | INTRAVENOUS | Status: DC

## 2023-04-01 MED ORDER — METHYLPREDNISOLONE 4 MG PO TBPK: ORAL_TABLET | ORAL | 0 refills | Status: DC

## 2023-04-01 MED ORDER — LACTATED RINGERS IV SOLN: INTRAVENOUS | Status: DC

## 2023-04-01 MED ORDER — HYDROMORPHONE HCL 1 MG/ML IJ SOLN: 1.0000 mg | Freq: Once | INTRAMUSCULAR | Status: DC

## 2023-04-01 MED ORDER — DEXAMETHASONE SODIUM PHOSPHATE 10 MG/ML IJ SOLN
10.0000 mg | Freq: Once | INTRAMUSCULAR | Status: AC
  Administered 2023-04-01: 10 mg via INTRAVENOUS

## 2023-04-01 MED ORDER — SODIUM CHLORIDE 0.9 % IV SOLN: 2.0000 g | Freq: Once | INTRAVENOUS | Status: DC

## 2023-04-01 NOTE — ED Triage Notes (Signed)
Pt in with co left jaw pain and swelling for a 1 week. Sent here from urgent care, hx of multiple Myeloma has been cleared by dentist.

## 2023-04-01 NOTE — Progress Notes (Signed)
Nutrition  Wife called and cancelled nutrition phone appointment for today because patient is currently in the ER.  Will reschedule patient.  Burnette Sautter B. Freida Busman, RD, LDN Registered Dietitian [phone]

## 2023-04-01 NOTE — ED Notes (Signed)
Call placed to Lab to inquire about the chemistry results. Lab states that they will have a report shortly. CT tech was informed that labs should result shortly so they can do the CT.

## 2023-04-01 NOTE — Sepsis Progress Note (Signed)
Elink following code sepsis °

## 2023-04-01 NOTE — Progress Notes (Signed)
CODE SEPSIS - PHARMACY COMMUNICATION  **Broad Spectrum Antibiotics should be administered within 1 hour of Sepsis diagnosis**  Time Code Sepsis Called/Page Received: 1430  Antibiotics Ordered: ceftriaxone IV 2 grams x 1  Time of 1st antibiotic administration: 1516  Additional action taken by pharmacy: none  If necessary, Name of Provider/Nurse Contacted: n/a    Elliot Gurney, PharmD, BCPS Clinical Pharmacist  04/01/2023 2:35 PM

## 2023-04-01 NOTE — ED Notes (Addendum)
Pt brought from [facility] with co jaw pain for few weeks. Hx of multiple myeloma. Has been seen for possible dental but states negative exam.

## 2023-04-01 NOTE — Progress Notes (Signed)
PHARMACY -  BRIEF ANTIBIOTIC NOTE   Pharmacy has received consult for vancomycin from an ED provider.  The patient's profile has been reviewed for ht/wt/allergies/indication/available labs.    One time order placed for vancomycin IV 1,000 mg x 1  Further antibiotics/pharmacy consults should be ordered by admitting physician if indicated.                       Thank you,  Elliot Gurney, PharmD, BCPS Clinical Pharmacist  04/01/2023 2:37 PM

## 2023-04-01 NOTE — ED Provider Notes (Signed)
[facility] Provider Note   Event Date/Time   First MD Initiated Contact with Patient 04/01/23 1339     (approximate) History  Jaw Pain  HPI Jack [name] is a 89 y.o. male with a past medical history of multiple myeloma who presents complaining of left jaw and submandibular pain over the last few weeks and significantly worsening over the last few days.  Patient is been seen by dentistry in the outpatient setting and told that this was not an odontogenic infection however given no improvement and acute worsening of his pain he presented the emergency department.  Patient denies any difficulty swallowing ROS: Patient currently denies any vision changes, tinnitus, difficulty speaking, facial droop, sore throat, chest pain, shortness of breath, abdominal pain, nausea/vomiting/diarrhea, dysuria, or weakness/numbness/paresthesias in any extremity   Physical Exam  Triage Vital Signs: ED Triage Vitals  Encounter Vitals Group     BP 04/01/23 0923 (!) 154/77     Systolic BP Percentile --      Diastolic BP Percentile --      Pulse Rate 04/01/23 0921 (!) 59     Resp 04/01/23 0921 18     Temp 04/01/23 0921 98.1 F (36.7 C)     Temp Source 04/01/23 0921 Oral     SpO2 04/01/23 0921 96 %     Weight 04/01/23 0922 168 lb (76.2 kg)     Height 04/01/23 0922 5\' 7"  (1.702 m)     Head Circumference --      Peak Flow --      Pain Score 04/01/23 0922 5     Pain Loc --      Pain Education --      Exclude from Growth Chart --    Most recent vital signs: Vitals:   04/01/23 1545 04/01/23 1600  BP: (!) 182/85 (!) 174/85  Pulse: 64 (!) 56  Resp:    Temp:    SpO2: 95% 98%   General: Awake, oriented x4. CV:  Good peripheral perfusion.  Resp:  Normal effort.  Abd:  No distention.  Other:  Edema along the left submandibular border with induration along the submandibular tissue.  No stridor appreciated.  No tongue elevation ED Results / Procedures / Treatments  Labs (all  labs ordered are listed, but only abnormal results are displayed) Labs Reviewed  CBC WITH DIFFERENTIAL/PLATELET - Abnormal; Notable for the following components:      Result Value   RBC 3.47 (*)    Hemoglobin 11.3 (*)    HCT 35.0 (*)    MCV 100.9 (*)    RDW 17.8 (*)    Platelets 81 (*)    All other components within normal limits  COMPREHENSIVE METABOLIC PANEL - Abnormal; Notable for the following components:   Glucose, Bld 122 (*)    BUN 28 (*)    Calcium 8.3 (*)    Total Protein 6.1 (*)    Albumin 3.3 (*)    All other components within normal limits  CULTURE, BLOOD (ROUTINE X 2)  CULTURE, BLOOD (ROUTINE X 2)  CULTURE, BLOOD (SINGLE)   RADIOLOGY ED MD interpretation: CT of the maxillofacial structures with IV contrast independently interpreted by me shows a peripherally enhancing fluid collection extending inferiorly and anteriorly from the anterior genu of the mandible at 0.7 x 3.5 x 1 cm consistent with an abscess.  The inferior aspect of the collection is not imaged.  The collection does not extend into the floor of the mouth -  Agree with radiology assessment Official radiology report(s): CT Maxillofacial W Contrast  Result Date: 04/01/2023 CLINICAL DATA:  Sublingual/submandibular abscess. Acute onset of left jaw and neck swelling over the last week. Multiple myeloma. EXAM: CT MAXILLOFACIAL WITH CONTRAST TECHNIQUE: Multidetector CT imaging of the maxillofacial structures was performed with intravenous contrast. Multiplanar CT image reconstructions were also generated. RADIATION DOSE REDUCTION: This exam was performed according to the departmental dose-optimization program which includes automated exposure control, adjustment of the mA and/or kV according to patient size and/or use of iterative reconstruction technique. CONTRAST:  75mL OMNIPAQUE IOHEXOL 300 MG/ML  SOLN COMPARISON:  CT head without contrast 06/10/2022 FINDINGS: Osseous: No acute or focal osseous lesion is present. Left  mandibular dental implants are noted. The anchors are unremarkable. Orbits: The globes and orbits are within normal limits. Sinuses: The paranasal sinuses and mastoid air cells are clear. Soft tissues: A peripherally enhancing fluid collection extends inferiorly and anteriorly from the anterior genu of the mandible it measures 0.7 x 3.5 by at least 10.0 mm. The inferior aspect of the collection is not imaged. The collection does not extend into the floor of the mouth. No breech of the mylohyoid is evident. Limited intracranial: Within normal limits. IMPRESSION: 1. Peripherally enhancing fluid collection extending inferiorly and anteriorly from the anterior genu of the mandible it measures 0.7 x 3.5 by at least 10.0 mm, consistent with an abscess. The inferior aspect of the collection is not imaged. 2. The collection does not extend into the floor of the mouth. 3. Left mandibular dental implants are noted. The anchors are unremarkable. No odontogenic source for the infection is implicated. Electronically Signed   By: Marin Roberts M.D.   On: 04/01/2023 12:18   PROCEDURES: Critical Care performed: No .1-3 Lead EKG Interpretation  Performed by: Merwyn Katos, MD Authorized by: Merwyn Katos, MD     Interpretation: normal     ECG rate:  71   ECG rate assessment: normal     Rhythm: sinus rhythm     Ectopy: none     Conduction: normal    MEDICATIONS ORDERED IN ED: Medications  lactated ringers infusion (0 mLs Intravenous Stopped 04/01/23 1655)  HYDROmorphone (DILAUDID) injection 1 mg (has no administration in time range)  iohexol (OMNIPAQUE) 300 MG/ML solution 75 mL (75 mLs Intravenous Contrast Given 04/01/23 1204)  dexamethasone (DECADRON) injection 10 mg (10 mg Intravenous Given 04/01/23 1500)  Ampicillin-Sulbactam (UNASYN) 3 g in sodium chloride 0.9 % 100 mL IVPB (0 g Intravenous Stopped 04/01/23 1548)  morphine (PF) 4 MG/ML injection 4 mg (4 mg Intravenous Given 04/01/23 1459)   IMPRESSION  / MDM / ASSESSMENT AND PLAN / ED COURSE  I reviewed the triage vital signs and the nursing notes.                             The patient is on the cardiac monitor to evaluate for evidence of arrhythmia and/or significant heart rate changes. Patient's presentation is most consistent with acute presentation with potential threat to life or bodily function. Presentation most consistent with submandibular cellulitis with associated abscess Given History, Exam, and Workup I have low suspicion for Necrotizing Fasciitis, Abscess, Osteomyelitis, DVT or other emergent problem as a cause for this presentation. Spoke to Dr. Jenne Campus and ENT who agrees with plan for 1 dose of IV antibiotics and steroids in emergency department and discharged on oral clindamycin and steroid burst as well as follow-up  Rx: Clindamycin x 10 days  Disposition: Discharge. No evidence of serious bacterial illness. Nontoxic appearing, VSS. Low risk for treatment failure based on history. Strict return precautions discussed with patient with full understanding. Advised patient to follow up promptly with primary care provider within next 48 hours.   FINAL CLINICAL IMPRESSION(S) / ED DIAGNOSES   Final diagnoses:  Mandibular abscess  Cellulitis of submandibular region   Rx / DC Orders   ED Discharge Orders          Ordered    clindamycin (CLEOCIN) 300 MG capsule  3 times daily        04/01/23 1552    methylPREDNISolone (MEDROL DOSEPAK) 4 MG TBPK tablet        04/01/23 1655           Note:  This document was prepared using Dragon voice recognition software and may include unintentional dictation errors.   Merwyn Katos, MD 04/01/23 [phone]

## 2023-04-02 DIAGNOSIS — R2689 Other abnormalities of gait and mobility: Secondary | ICD-10-CM | POA: Diagnosis not present

## 2023-04-02 DIAGNOSIS — S32591D Other specified fracture of right pubis, subsequent encounter for fracture with routine healing: Secondary | ICD-10-CM | POA: Diagnosis not present

## 2023-04-02 DIAGNOSIS — C9000 Multiple myeloma not having achieved remission: Secondary | ICD-10-CM | POA: Diagnosis not present

## 2023-04-02 DIAGNOSIS — M6281 Muscle weakness (generalized): Secondary | ICD-10-CM | POA: Diagnosis not present

## 2023-04-02 DIAGNOSIS — J9601 Acute respiratory failure with hypoxia: Secondary | ICD-10-CM | POA: Diagnosis not present

## 2023-04-02 LAB — CULTURE, BLOOD (SINGLE)

## 2023-04-03 ENCOUNTER — Other Ambulatory Visit: Payer: Self-pay

## 2023-04-03 ENCOUNTER — Emergency Department: Payer: PPO

## 2023-04-03 ENCOUNTER — Inpatient Hospital Stay
Admission: EM | Admit: 2023-04-03 | Discharge: 2023-04-06 | DRG: 158 | Disposition: A | Discharge status: Home Health | Payer: PPO | Source: Ambulatory Visit | Attending: Internal Medicine | Admitting: Internal Medicine

## 2023-04-03 ENCOUNTER — Encounter: Payer: Self-pay | Admitting: Emergency Medicine

## 2023-04-03 DIAGNOSIS — L03211 Cellulitis of face: Secondary | ICD-10-CM | POA: Diagnosis not present

## 2023-04-03 DIAGNOSIS — Z66 Do not resuscitate: Secondary | ICD-10-CM | POA: Diagnosis not present

## 2023-04-03 DIAGNOSIS — Z8719 Personal history of other diseases of the digestive system: Secondary | ICD-10-CM | POA: Diagnosis not present

## 2023-04-03 DIAGNOSIS — D84821 Immunodeficiency due to drugs: Secondary | ICD-10-CM | POA: Diagnosis present

## 2023-04-03 DIAGNOSIS — Z87442 Personal history of urinary calculi: Secondary | ICD-10-CM | POA: Diagnosis not present

## 2023-04-03 DIAGNOSIS — N1832 Chronic kidney disease, stage 3b: Secondary | ICD-10-CM | POA: Diagnosis present

## 2023-04-03 DIAGNOSIS — T380X5A Adverse effect of glucocorticoids and synthetic analogues, initial encounter: Secondary | ICD-10-CM | POA: Diagnosis present

## 2023-04-03 DIAGNOSIS — J9601 Acute respiratory failure with hypoxia: Secondary | ICD-10-CM | POA: Diagnosis not present

## 2023-04-03 DIAGNOSIS — D631 Anemia in chronic kidney disease: Secondary | ICD-10-CM | POA: Diagnosis present

## 2023-04-03 DIAGNOSIS — I129 Hypertensive chronic kidney disease with stage 1 through stage 4 chronic kidney disease, or unspecified chronic kidney disease: Secondary | ICD-10-CM | POA: Diagnosis not present

## 2023-04-03 DIAGNOSIS — K122 Cellulitis and abscess of mouth: Principal | ICD-10-CM | POA: Diagnosis present

## 2023-04-03 DIAGNOSIS — K219 Gastro-esophageal reflux disease without esophagitis: Secondary | ICD-10-CM | POA: Diagnosis present

## 2023-04-03 DIAGNOSIS — C9000 Multiple myeloma not having achieved remission: Secondary | ICD-10-CM | POA: Diagnosis present

## 2023-04-03 DIAGNOSIS — F17290 Nicotine dependence, other tobacco product, uncomplicated: Secondary | ICD-10-CM | POA: Diagnosis not present

## 2023-04-03 DIAGNOSIS — Z8249 Family history of ischemic heart disease and other diseases of the circulatory system: Secondary | ICD-10-CM

## 2023-04-03 DIAGNOSIS — R918 Other nonspecific abnormal finding of lung field: Secondary | ICD-10-CM | POA: Diagnosis not present

## 2023-04-03 DIAGNOSIS — I7121 Aneurysm of the ascending aorta, without rupture: Secondary | ICD-10-CM | POA: Diagnosis present

## 2023-04-03 DIAGNOSIS — I4891 Unspecified atrial fibrillation: Secondary | ICD-10-CM | POA: Diagnosis present

## 2023-04-03 DIAGNOSIS — Z79899 Other long term (current) drug therapy: Secondary | ICD-10-CM | POA: Diagnosis not present

## 2023-04-03 DIAGNOSIS — A4289 Other forms of actinomycosis: Secondary | ICD-10-CM | POA: Diagnosis not present

## 2023-04-03 DIAGNOSIS — N183 Chronic kidney disease, stage 3 unspecified: Secondary | ICD-10-CM | POA: Diagnosis not present

## 2023-04-03 DIAGNOSIS — E785 Hyperlipidemia, unspecified: Secondary | ICD-10-CM | POA: Diagnosis not present

## 2023-04-03 DIAGNOSIS — N400 Enlarged prostate without lower urinary tract symptoms: Secondary | ICD-10-CM | POA: Diagnosis present

## 2023-04-03 DIAGNOSIS — L0201 Cutaneous abscess of face: Secondary | ICD-10-CM | POA: Diagnosis not present

## 2023-04-03 DIAGNOSIS — R0989 Other specified symptoms and signs involving the circulatory and respiratory systems: Secondary | ICD-10-CM | POA: Diagnosis not present

## 2023-04-03 DIAGNOSIS — L0390 Cellulitis, unspecified: Secondary | ICD-10-CM

## 2023-04-03 DIAGNOSIS — R7881 Bacteremia: Secondary | ICD-10-CM | POA: Diagnosis present

## 2023-04-03 DIAGNOSIS — I482 Chronic atrial fibrillation, unspecified: Secondary | ICD-10-CM

## 2023-04-03 DIAGNOSIS — Z888 Allergy status to other drugs, medicaments and biological substances status: Secondary | ICD-10-CM | POA: Diagnosis not present

## 2023-04-03 DIAGNOSIS — Z8579 Personal history of other malignant neoplasms of lymphoid, hematopoietic and related tissues: Secondary | ICD-10-CM | POA: Diagnosis not present

## 2023-04-03 DIAGNOSIS — I480 Paroxysmal atrial fibrillation: Secondary | ICD-10-CM | POA: Diagnosis not present

## 2023-04-03 DIAGNOSIS — Z7901 Long term (current) use of anticoagulants: Secondary | ICD-10-CM

## 2023-04-03 DIAGNOSIS — M6281 Muscle weakness (generalized): Secondary | ICD-10-CM | POA: Diagnosis not present

## 2023-04-03 DIAGNOSIS — S32592D Other specified fracture of left pubis, subsequent encounter for fracture with routine healing: Secondary | ICD-10-CM | POA: Diagnosis not present

## 2023-04-03 DIAGNOSIS — R9389 Abnormal findings on diagnostic imaging of other specified body structures: Secondary | ICD-10-CM | POA: Diagnosis not present

## 2023-04-03 DIAGNOSIS — R5381 Other malaise: Secondary | ICD-10-CM | POA: Diagnosis present

## 2023-04-03 DIAGNOSIS — D696 Thrombocytopenia, unspecified: Secondary | ICD-10-CM | POA: Diagnosis not present

## 2023-04-03 DIAGNOSIS — Z7952 Long term (current) use of systemic steroids: Secondary | ICD-10-CM

## 2023-04-03 DIAGNOSIS — R001 Bradycardia, unspecified: Secondary | ICD-10-CM | POA: Diagnosis not present

## 2023-04-03 DIAGNOSIS — R2689 Other abnormalities of gait and mobility: Secondary | ICD-10-CM | POA: Diagnosis not present

## 2023-04-03 DIAGNOSIS — I10 Essential (primary) hypertension: Secondary | ICD-10-CM | POA: Diagnosis present

## 2023-04-03 DIAGNOSIS — Z83438 Family history of other disorder of lipoprotein metabolism and other lipidemia: Secondary | ICD-10-CM

## 2023-04-03 LAB — CBC WITH DIFFERENTIAL/PLATELET
Abs Immature Granulocytes: 0.02 10*3/uL (ref 0.00–0.07)
Basophils Absolute: 0 10*3/uL (ref 0.0–0.1)
Basophils Relative: 0 %
Eosinophils Absolute: 0 10*3/uL (ref 0.0–0.5)
Eosinophils Relative: 1 %
HCT: 36.3 % — ABNORMAL LOW (ref 39.0–52.0)
Hemoglobin: 11.8 g/dL — ABNORMAL LOW (ref 13.0–17.0)
Immature Granulocytes: 0 %
Lymphocytes Relative: 16 %
Lymphs Abs: 0.9 10*3/uL (ref 0.7–4.0)
MCH: 32.8 pg (ref 26.0–34.0)
MCHC: 32.5 g/dL (ref 30.0–36.0)
MCV: 100.8 fL — ABNORMAL HIGH (ref 80.0–100.0)
Monocytes Absolute: 1 10*3/uL (ref 0.1–1.0)
Monocytes Relative: 17 %
Neutro Abs: 3.8 10*3/uL (ref 1.7–7.7)
Neutrophils Relative %: 66 %
Platelets: 96 10*3/uL — ABNORMAL LOW (ref 150–400)
RBC: 3.6 MIL/uL — ABNORMAL LOW (ref 4.22–5.81)
RDW: 17.8 % — ABNORMAL HIGH (ref 11.5–15.5)
WBC: 5.8 10*3/uL (ref 4.0–10.5)
nRBC: 0 % (ref 0.0–0.2)

## 2023-04-03 LAB — URINALYSIS, W/ REFLEX TO CULTURE (INFECTION SUSPECTED)
Bacteria, UA: NONE SEEN
Bilirubin Urine: NEGATIVE
Glucose, UA: 50 mg/dL — AB
Hgb urine dipstick: NEGATIVE
Ketones, ur: NEGATIVE mg/dL
Leukocytes,Ua: NEGATIVE
Nitrite: NEGATIVE
Protein, ur: 30 mg/dL — AB
Specific Gravity, Urine: 1.021 (ref 1.005–1.030)
Squamous Epithelial / HPF: NONE SEEN /HPF (ref 0–5)
pH: 5 (ref 5.0–8.0)

## 2023-04-03 LAB — COMPREHENSIVE METABOLIC PANEL
ALT: 13 U/L (ref 0–44)
AST: 18 U/L (ref 15–41)
Albumin: 3.5 g/dL (ref 3.5–5.0)
Alkaline Phosphatase: 80 U/L (ref 38–126)
Anion gap: 7 (ref 5–15)
BUN: 38 mg/dL — ABNORMAL HIGH (ref 8–23)
CO2: 26 mmol/L (ref 22–32)
Calcium: 8.2 mg/dL — ABNORMAL LOW (ref 8.9–10.3)
Chloride: 106 mmol/L (ref 98–111)
Creatinine, Ser: 1.17 mg/dL (ref 0.61–1.24)
GFR, Estimated: >60 mL/min (ref >60)
Glucose, Bld: 120 mg/dL — ABNORMAL HIGH (ref 70–99)
Potassium: 4.6 mmol/L (ref 3.5–5.1)
Sodium: 139 mmol/L (ref 135–145)
Total Bilirubin: 0.4 mg/dL (ref 0.3–1.2)
Total Protein: 6.2 g/dL — ABNORMAL LOW (ref 6.5–8.1)

## 2023-04-03 LAB — LACTIC ACID, PLASMA
Lactic Acid, Venous: 1.8 mmol/L (ref 0.5–1.9)
Lactic Acid, Venous: 1.8 mmol/L (ref 0.5–1.9)

## 2023-04-03 MED ORDER — HYDROMORPHONE HCL 1 MG/ML IJ SOLN: 1.0000 mg | INTRAMUSCULAR | Status: DC | PRN

## 2023-04-03 MED ORDER — SODIUM CHLORIDE 0.9 % IV SOLN
2.0000 g | Freq: Once | INTRAVENOUS | Status: AC
  Administered 2023-04-03: 2 g via INTRAVENOUS
  Filled 2023-04-03: qty 12.5

## 2023-04-03 MED ORDER — SODIUM CHLORIDE 0.9 % IV SOLN: 2.0000 g | Freq: Once | INTRAVENOUS | Status: DC

## 2023-04-03 MED ORDER — ONDANSETRON HCL 4 MG PO TABS: 4.0000 mg | ORAL_TABLET | Freq: Four times a day (QID) | ORAL | Status: DC | PRN

## 2023-04-03 MED ORDER — PIPERACILLIN-TAZOBACTAM 3.375 G IVPB: 3.3750 g | Freq: Three times a day (TID) | INTRAVENOUS | Status: DC

## 2023-04-03 MED ORDER — SODIUM CHLORIDE 0.9 % IV SOLN
3.0000 g | Freq: Four times a day (QID) | INTRAVENOUS | Status: DC
  Administered 2023-04-03 – 2023-04-06 (×11): 3 g via INTRAVENOUS
  Filled 2023-04-03 (×13): qty 8

## 2023-04-03 MED ORDER — VANCOMYCIN HCL IN DEXTROSE 1-5 GM/200ML-% IV SOLN
1000.0000 mg | INTRAVENOUS | Status: DC
  Administered 2023-04-04: 1000 mg via INTRAVENOUS
  Filled 2023-04-03 (×2): qty 200

## 2023-04-03 MED ORDER — SODIUM CHLORIDE 0.9 % IV SOLN: INTRAVENOUS | Status: DC

## 2023-04-03 MED ORDER — IOHEXOL 300 MG/ML  SOLN
75.0000 mL | Freq: Once | INTRAMUSCULAR | Status: AC | PRN
  Administered 2023-04-03: 75 mL via INTRAVENOUS

## 2023-04-03 MED ORDER — OXYCODONE HCL 5 MG PO TABS
10.0000 mg | ORAL_TABLET | ORAL | Status: DC | PRN
  Administered 2023-04-03 – 2023-04-06 (×11): 10 mg via ORAL
  Filled 2023-04-03 (×11): qty 2

## 2023-04-03 MED ORDER — VANCOMYCIN HCL 1750 MG/350ML IV SOLN
1750.0000 mg | Freq: Once | INTRAVENOUS | Status: AC
  Administered 2023-04-03: 1750 mg via INTRAVENOUS
  Filled 2023-04-03: qty 350

## 2023-04-03 MED ORDER — DEXAMETHASONE SODIUM PHOSPHATE 10 MG/ML IJ SOLN
10.0000 mg | Freq: Three times a day (TID) | INTRAMUSCULAR | Status: DC
  Administered 2023-04-03 – 2023-04-06 (×8): 10 mg via INTRAVENOUS
  Filled 2023-04-03 (×8): qty 1

## 2023-04-03 MED ORDER — ONDANSETRON HCL 4 MG/2ML IJ SOLN: 4.0000 mg | Freq: Four times a day (QID) | INTRAMUSCULAR | Status: DC | PRN

## 2023-04-03 NOTE — Assessment & Plan Note (Signed)
Rate controlled at present Continue home Eliquis

## 2023-04-03 NOTE — Assessment & Plan Note (Signed)
Stage II kappa chain myeloma Followed by Dr. Orlie Dakin outpatient Continue Revlimid Continue pain regimen Follow

## 2023-04-03 NOTE — Assessment & Plan Note (Addendum)
Persistent left lower jaw redness and swelling despite course of oral antibiotics including clindamycin No active trismus or trouble swallowing per patient but with worsening pain CT soft tissue neck pending to better assess On IV zosyn and vancomycin in setting of bacteremia 1/2 blood cultures with gram-positive rods Follow-up imaging ENT consult as clinically indicated Follow

## 2023-04-03 NOTE — H&P (Addendum)
History and Physical    Patient: Jack [name]:[id number] DOB: [birth year] DOA: 04/03/2023 DOS: the patient was seen and examined on 04/03/2023 PCP: Dorothey Baseman, MD  Patient coming from: Home  Chief Complaint:  Chief Complaint  Patient presents with   Abnormal Lab   HPI: Jack [name] is a 89 y.o. male with medical history significant of multiple myeloma, stage III CKD, GERD, hyperlipidemia, hypertension, atrial fibrillation presenting with bacteremia and submandibular abscess.  History from patient as well as wife at the bedside.  Per report, patient with worsening jaw swelling over the past 1 to 2 months.  Initially saw dentistry still with worsening symptoms.  Noted to be followed by Dr. Orlie Dakin with oncology.  Had one of his medications Rivka Barbara held with concern of it being the source of pain.  However, over the past week or so, patient with worsening redness and swelling of the left lower jaw and submandibular area.  Was seen by urgent care and redirected to the ER on July 19.  Had CT done concerning for submandibular abscess.  Per report, ENT evaluated with recommendation for outpatient antibiotics.  Blood cultures also drawn.  Per the wife, patient has been compliant with antibiotics.  Still with moderate redness and swelling.  No trismus or difficulty swallowing.  No shortness of breath.  Was called back to the ER today because of positive blood culture showing gram-positive rods 1/2 cultures.  Presented to the ER afebrile, hemodynamically stable.  White count 5.8, hemoglobin 11.8, platelets 96, lactate 1.8.  Creatinine 1.17.  CT of the soft tissue neck pending.  Started on empiric antibiotics in the ER. Review of Systems: As mentioned in the history of present illness. All other systems reviewed and are negative. Past Medical History:  Diagnosis Date   Ascending aortic aneurysm (HCC)    a. 12/2020 Echo: Asc Ao 48mm, Ao root 40mm. b. 03/2022 Asc Ao 4.6 cm (4.5 cm in 2019)   CKD  (chronic kidney disease), stage III (HCC)    Compression fracture of body of thoracic vertebra (HCC)    Diastolic dysfunction    a. 12/2020 Echo: EF 50-55%, no rwma, mild LVH, GrI DD, nl RV fxn, mild AI. Asc Ao 48mm, Ao root 40mm.   Elevated prostate specific antigen (PSA)    has been 7 for a year    GERD (gastroesophageal reflux disease)    History of colon polyps 2008   [facility],    History of kidney stones    Hyperlipidemia    Hypertension    Myeloma (HCC)    PAF (paroxysmal atrial fibrillation) (HCC)    a. 01/2021-->Eliquis (CHA2DS2VASc = 3-4).   Pain    Prostate hypertrophy    diagnosed at age 27 due to hematospermia   Past Surgical History:  Procedure Laterality Date   CATARACT EXTRACTION W/PHACO Left 01/10/2018   Procedure: CATARACT EXTRACTION PHACO AND INTRAOCULAR LENS PLACEMENT (IOC);  Surgeon: Galen Manila, MD;  Location: [facility] ORS;  Service: Ophthalmology;  Laterality: Left;  [name] 00:24.8 AP% 14.9 CDE 3.68 Fluid pack lot # [id number]   CATARACT EXTRACTION W/PHACO Right 01/25/2018   Procedure: CATARACT EXTRACTION PHACO AND INTRAOCULAR LENS PLACEMENT (IOC);  Surgeon: Galen Manila, MD;  Location: [facility] ORS;  Service: Ophthalmology;  Laterality: Right;  [name] 00:42 AP% 10.8 CDE 4.59 Fluid pack lot # [id number]   COLON SURGERY     CYSTOSCOPY W/ URETERAL STENT PLACEMENT Right 10/16/2017   Procedure: right  URETERAL STENT PLACEMENT,cystoscopy bilateral stent removal,rretrograde;  Surgeon: Riki Altes, MD;  Location: [facility] ORS;  Service: Urology;  Laterality: Right;   CYSTOSCOPY/URETEROSCOPY/HOLMIUM LASER/STENT PLACEMENT Right 12/16/2020   Procedure: CYSTOSCOPY/URETEROSCOPY/HOLMIUM LASER/STENT PLACEMENT;  Surgeon: Riki Altes, MD;  Location: [facility] OR[facility];  Service: Urology;  Laterality: Right;   EXTRACORPOREAL SHOCK WAVE LITHOTRIPSY Right 12/11/2020   Procedure: EXTRACORPOREAL SHOCK WAVE LITHOTRIPSY (ESWL);  Surgeon: Riki Altes, MD;  Location: [facility] ORS;  Service:  Urology;  Laterality: Right;   IR BONE TUMOR(S)RF ABLATION  04/16/2022   IR KYPHO EA ADDL LEVEL THORACIC OR LUMBAR  02/02/2021   IR KYPHO LUMBAR INC FX REDUCE BONE BX UNI/BIL CANNULATION INC/IMAGING  02/02/2021   IR KYPHO THORACIC WITH BONE BIOPSY  04/15/2022   KIDNEY STONE SURGERY     KYPHOPLASTY N/A 03/12/2021   Procedure: Nicki Reaper, L3;  Surgeon: Kennedy Bucker, MD;  Location: [facility] ORS;  Service: Orthopedics;  Laterality: N/A;   RESECTION SOFT TISSUE TUMOR LEG / ANKLE RADICAL  jan 2009   [facility],  right thigh/knee , nonmalignant   SMALL INTESTINE SURGERY  1946   implaed on picket fence, punctured stomach   TONSILLECTOMY     Social History:  reports that he quit smoking about 57 years ago. His smoking use included cigarettes. He uses smokeless tobacco. He reports that he does not currently use alcohol after a past usage of about 1.0 standard drink of alcohol per week. He reports that he does not use drugs.  Allergies  Allergen Reactions   Quinolones     Aortic dilation contraindicates FQ   Amlodipine Swelling and Other (See Comments)    LE edema   Azithromycin Nausea And Vomiting   Lipitor [Atorvastatin]     Muscle pain in RT Leg    Lisinopril Cough   Zetia [Ezetimibe]     Pain in legs     Family History  Problem Relation Age of Onset   Hypertension Father    Hyperlipidemia Father    Cancer Sister        thyroid - dx in late 20's    Prior to Admission medications   Medication Sig Start Date End Date Taking? Authorizing Provider  acetaminophen (TYLENOL) 500 MG tablet Take 500-1,000 mg by mouth every 8 (eight) hours as needed for moderate pain.    [provider]  albuterol (VENTOLIN HFA) 108 (90 Base) MCG/ACT inhaler Inhale into the lungs. 10/06/22   [provider]  apixaban (ELIQUIS) 5 MG TABS tablet Take 1 tablet (5 mg total) by mouth 2 (two) times daily. 01/24/23   Jeralyn Ruths, MD  Ascorbic Acid (VITAMIN C) 1000 MG tablet Take 1,000 mg by mouth in  the morning and at bedtime.    [provider]  Calcium Carb-Cholecalciferol (OYSTER SHELL CALCIUM W/D) 500-5 MG-MCG TABS Take 1 tablet by mouth in the morning and at bedtime.    [provider]  carvedilol (COREG) 12.5 MG tablet Take 0.5 tablets (6.25 mg total) by mouth 2 (two) times daily. 03/04/23   Creig Hines, NP  chlorhexidine (PERIDEX) 0.12 % solution SMARTSIG:By Mouth 03/09/23   [provider]  clindamycin (CLEOCIN) 300 MG capsule Take 1 capsule (300 mg total) by mouth 3 (three) times daily for 10 days. 04/01/23 04/11/23  Merwyn Katos, MD  dexamethasone (DECADRON) 4 MG tablet TAKE 5 TABLETS BY MOUTH ONE TIME PER WEEK 01/06/23   Jeralyn Ruths, MD  feeding supplement (ENSURE ENLIVE / ENSURE PLUS) LIQD Take 237 mLs by mouth 3 (three) times  daily between meals. Patient taking differently: Take 237 mLs by mouth daily with lunch. 04/07/21   Darlin Priestly, MD  gabapentin (NEURONTIN) 300 MG capsule Take 2 capsules (600 mg total) by mouth 3 (three) times daily. 01/11/23   Borders, Daryl Eastern, NP  ibuprofen (ADVIL) 200 MG tablet Take 200 mg by mouth 2 (two) times daily as needed.    [provider]  lenalidomide (REVLIMID) 20 MG capsule Take 1 capsule (20 mg total) by mouth daily. Take for 21 days, then hold for 7 days. Repeat every 28 days 03/28/23   Jeralyn Ruths, MD  lidocaine (XYLOCAINE) 5 % ointment Apply topically 3 (three) times daily as needed for mild pain or moderate pain. 02/21/23   Jeralyn Ruths, MD  methylPREDNISolone (MEDROL DOSEPAK) 4 MG TBPK tablet Per package dirctions 04/01/23   Merwyn Katos, MD  morphine (MS CONTIN) 30 MG 12 hr tablet Take 1 tablet (30 mg total) by mouth every 8 (eight) hours. 03/11/23   Borders, Daryl Eastern, NP  Multiple Vitamin (MULTIVITAMIN WITH MINERALS) TABS tablet Take 1 tablet by mouth daily. 04/08/21   Darlin Priestly, MD  Multiple Vitamins-Minerals (PRESERVISION AREDS PO) Take 1 capsule by mouth in the morning  and at bedtime.    [provider]  naloxone (NARCAN) nasal spray 4 mg/0.1 mL SPRAY 1 SPRAY INTO ONE NOSTRIL AS DIRECTED FOR OPIOID OVERDOSE (TURN PERSON ON SIDE AFTER DOSE. IF NO RESPONSE IN 2-3 MINUTES OR PERSON RESPONDS BUT RELAPSES, REPEAT USING A NEW SPRAY DEVICE AND SPRAY INTO THE OTHER NOSTRIL. CALL 911 AFTER USE.) * EMERGENCY USE ONLY * 03/25/21   Borders, Daryl Eastern, NP  omeprazole (PRILOSEC) 20 MG capsule Take 20 mg by mouth daily.    [provider]  Oxycodone HCl 10 MG TABS Take 1 tablet (10 mg total) by mouth every 4 (four) hours as needed (pain). 03/11/23   Borders, Daryl Eastern, NP  polyethylene glycol (MIRALAX / GLYCOLAX) 17 g packet Take 17 g by mouth 2 (two) times daily. 04/07/21   Darlin Priestly, MD  potassium citrate (UROCIT-K) 10 MEQ (1080 MG) SR tablet TAKE 1 TABLET (10 MEQ TOTAL) BY MOUTH 3 (THREE) TIMES DAILY WITH MEALS. 08/17/22   Stoioff, Verna Czech, MD  senna-docusate (SENOKOT-S) 8.6-50 MG tablet Take 1 tablet by mouth 2 (two) times daily. 03/17/21   Pennie Banter, DO    Physical Exam: Vitals:   04/03/23 1131 04/03/23 1132 04/03/23 1330  BP: 112/74  (!) 143/86  Pulse: (!) 52  (!) 52  Resp: 18  (!) 22  Temp: 97.9 F (36.6 C)    TempSrc: Oral    SpO2: 98%  96%  Weight:  76.2 kg   Height:  5\' 6"  (1.676 m)    Physical Exam Constitutional:      Appearance: He is normal weight.  HENT:     Head: Normocephalic.     Comments: + L lower jaw swelling and fullness     Nose: Nose normal.  Eyes:     Pupils: Pupils are equal, round, and reactive to light.  Cardiovascular:     Rate and Rhythm: Normal rate and regular rhythm.  Pulmonary:     Effort: Pulmonary effort is normal.  Abdominal:     General: Bowel sounds are normal.  Skin:    General: Skin is warm.  Neurological:     General: No focal deficit present.  Psychiatric:        Mood and Affect: Mood normal.  Data Reviewed:  There are no new results to review at this time. DG Chest Port 1  View CLINICAL DATA:  Sepsis.  History of multiple myeloma.  EXAM: PORTABLE CHEST 1 VIEW  COMPARISON:  Chest radiographs 11/01/2022  FINDINGS: The cardiomediastinal silhouette is unchanged with normal heart size. Lung volumes are decreased with a small amount of hazy opacity in both mid lungs. Small calcified pleural nodules are again noted on the left. No sizable pleural effusion, overt pulmonary edema, or pneumothorax is identified. No acute osseous abnormality is seen.  IMPRESSION: Low lung volumes with mild hazy opacities in both mid lungs for which early infection is not excluded.  Electronically Signed   By: Sebastian Ache M.D.   On: 04/03/2023 12:15  Lab Results  Component Value Date   WBC 5.8 04/03/2023   HGB 11.8 (L) 04/03/2023   HCT 36.3 (L) 04/03/2023   MCV 100.8 (H) 04/03/2023   PLT 96 (L) 04/03/2023   Last metabolic panel Lab Results  Component Value Date   GLUCOSE 120 (H) 04/03/2023   NA 139 04/03/2023   K 4.6 04/03/2023   CL 106 04/03/2023   CO2 26 04/03/2023   BUN 38 (H) 04/03/2023   CREATININE 1.17 04/03/2023   GFRNONAA >60 04/03/2023   CALCIUM 8.2 (L) 04/03/2023   PROT 6.2 (L) 04/03/2023   ALBUMIN 3.5 04/03/2023   LABGLOB 1.7 02/24/2021   AGRATIO 2.6 (H) 02/24/2021   BILITOT 0.4 04/03/2023   ALKPHOS 80 04/03/2023   AST 18 04/03/2023   ALT 13 04/03/2023   ANIONGAP 7 04/03/2023    Assessment and Plan: * Bacteremia Blood cultures 1/2 with gram-positive rods in the setting of recent evaluation for submandibular abscess with noted concurrent multiple myeloma on immunosuppressive medications Started on IV cefepime and vancomycin in the ER pending speciation and repeat blood cultures Will transition from cefepime to Zosyn per ID preliminary recommendations Will formally consult infectious disease for further evaluation Follow  Submandibular abscess Persistent left lower jaw redness and swelling despite course of oral antibiotics including  clindamycin No active trismus or trouble swallowing per patient but with worsening pain CT soft tissue neck pending to better assess On IV zosyn and vancomycin in setting of bacteremia 1/2 blood cultures with gram-positive rods Follow-up imaging ENT consult as clinically indicated Follow  Aneurysm of ascending aorta without rupture (HCC) 03/2022 CT with a 4.6 cm ascending aortic aneurysm Followed by CT surgery  Pt declining any further intervention   Chronic kidney disease, stage 3b (HCC) Creatinine 1.17 today with GFR greater than 60 Monitor  Multiple myeloma (HCC) Stage II kappa chain myeloma Followed by Dr. Orlie Dakin outpatient Continue Revlimid Continue pain regimen Follow   Atrial fibrillation (HCC) Rate controlled at present Continue home Eliquis  GERD PPI  Essential hypertension BP stable Titrate home regimen      Advance Care Planning:   Code Status: DNR   Consults: Infectious disease   Family Communication: Wife at the bedside   Severity of Illness: The appropriate patient status for this patient is INPATIENT. Inpatient status is judged to be reasonable and necessary in order to provide the required intensity of service to ensure the patient's safety. The patient's presenting symptoms, physical exam findings, and initial radiographic and laboratory data in the context of their chronic comorbidities is felt to place them at high risk for further clinical deterioration. Furthermore, it is not anticipated that the patient will be medically stable for discharge from the hospital within 2 midnights of admission.   *  I certify that at the point of admission it is my clinical judgment that the patient will require inpatient hospital care spanning beyond 2 midnights from the point of admission due to high intensity of service, high risk for further deterioration and high frequency of surveillance required.*  Author: [name], MD 04/03/2023 2:06 PM  For on  call review [email or web address].

## 2023-04-03 NOTE — ED Notes (Signed)
Attempted to get new lt green tube for lab. New IV placed but does not give blood return. Will look with u/s.

## 2023-04-03 NOTE — Assessment & Plan Note (Signed)
Creatinine 1.17 today with GFR greater than 60 Monitor

## 2023-04-03 NOTE — ED Provider Notes (Signed)
[facility] Provider Note    Event Date/Time   First MD Initiated Contact with Patient 04/03/23 1157     (approximate)   History   Abnormal Lab   HPI  Jack [name] is a 89 y.o. male   Past medical history of multiple myeloma on Revlimid (per patient's wife report, I cannot see medical history of this on chart) who presents to the emergency department with pain underneath his chin, swelling, redness who was seen a couple of days ago was in the emergency department for abscess and cellulitis submandibular and discharged on antibiotics with plan for ENT follow-up.  He was called back today because of a positive blood culture obtained 1 out of 4 tubes showing gram-positive bacteria.  He states that the swelling underneath his chin has improved moderately, denies any difficulty with respirations or with swallowing but does feel tired.  He denies fever or chills.  He has been compliant with his clindamycin prescription.  Independent Historian contributed to assessment above: Wife at bedside corroborates information given above  External Medical Documents Reviewed: Emergency department note dated 04/01/2023 diagnosed with submandibular abscess and cellulitis discharged with antibiotic course      Physical Exam   Triage Vital Signs: ED Triage Vitals  Encounter Vitals Group     BP 04/03/23 1131 112/74     Systolic BP Percentile --      Diastolic BP Percentile --      Pulse Rate 04/03/23 1131 (!) 52     Resp 04/03/23 1131 18     Temp 04/03/23 1131 97.9 F (36.6 C)     Temp Source 04/03/23 1131 Oral     SpO2 04/03/23 1131 98 %     Weight 04/03/23 1132 168 lb (76.2 kg)     Height 04/03/23 1132 5\' 6"  (1.676 m)     Head Circumference --      Peak Flow --      Pain Score 04/03/23 1132 7     Pain Loc --      Pain Education --      Exclude from Growth Chart --     Most recent vital signs: Vitals:   04/03/23 1131  BP: 112/74  Pulse: (!) 52  Resp: 18   Temp: 97.9 F (36.6 C)  SpO2: 98%    General: Awake, no distress.  CV:  Good peripheral perfusion.  Resp:  Normal effort.  Abd:  No distention. Other:  Swelling submental with some overlying cellulitic changes, no crepitus or pain out of proportion, no obvious fluctuance and he is maintaining secretions no respiratory distress and he has normal phonation.   ED Results / Procedures / Treatments   Labs (all labs ordered are listed, but only abnormal results are displayed) Labs Reviewed  CBC WITH DIFFERENTIAL/PLATELET - Abnormal; Notable for the following components:      Result Value   RBC 3.60 (*)    Hemoglobin 11.8 (*)    HCT 36.3 (*)    MCV 100.8 (*)    RDW 17.8 (*)    Platelets 96 (*)    All other components within normal limits  CULTURE, BLOOD (ROUTINE X 2)  CULTURE, BLOOD (ROUTINE X 2)  LACTIC ACID, PLASMA  LACTIC ACID, PLASMA  URINALYSIS, W/ REFLEX TO CULTURE (INFECTION SUSPECTED)  COMPREHENSIVE METABOLIC PANEL     I ordered and reviewed the above labs they are notable for white blood cell count is normal.    PROCEDURES:  Critical  Care performed: No  Procedures   MEDICATIONS ORDERED IN ED: Medications  ceFEPIme (MAXIPIME) 2 g in sodium chloride 0.9 % 100 mL IVPB (has no administration in time range)    External physician / consultants:  I spoke with hospitalist for admission and regarding care plan for this patient.   IMPRESSION / MDM / ASSESSMENT AND PLAN / ED COURSE  I reviewed the triage vital signs and the nursing notes.                                Patient's presentation is most consistent with acute presentation with potential threat to life or bodily function.  Differential diagnosis includes, but is not limited to, bacteremia, abscess, cellulitis, considered but less likely Ludwick's or airway compromise   The patient is on the cardiac monitor to evaluate for evidence of arrhythmia and/or significant heart rate changes.  MDM:     Patient's symptoms have actually improved moderately with swelling and cellulitis improved per patient report and has been compliant with antibiotics.  He is immunosuppressed multiple myeloma patient is high risk, with positive blood cultures consider bacteremia versus contaminant.  Broaden his antibiotics, repeat blood cultures, admission.        FINAL CLINICAL IMPRESSION(S) / ED DIAGNOSES   Final diagnoses:  Submental abscess  Cellulitis, unspecified cellulitis site     Rx / DC Orders   ED Discharge Orders     None        Note:  This document was prepared using Dragon voice recognition software and may include unintentional dictation errors.    Pilar Jarvis, MD 04/03/23 [phone]

## 2023-04-03 NOTE — Assessment & Plan Note (Addendum)
Blood cultures 1/2 with gram-positive rods in the setting of recent evaluation for submandibular abscess with noted concurrent multiple myeloma on immunosuppressive medications Started on IV cefepime and vancomycin in the ER pending speciation and repeat blood cultures Will transition from cefepime to Zosyn per ID preliminary recommendations Will formally consult infectious disease for further evaluation Follow

## 2023-04-03 NOTE — Progress Notes (Signed)
Order received for patient to get a full liquid meal once for NPO then resume NPO

## 2023-04-03 NOTE — Assessment & Plan Note (Signed)
BP stable Titrate home regimen 

## 2023-04-03 NOTE — Consult Note (Addendum)
Pharmacy Antibiotic Note  Jack [name] is a 89 y.o. male admitted on 04/03/2023 with bacteremia.  Patient presented to ED on 7/19 and was diagnosed with submandibular abscess and discharged on PO antibiotics.  Patient called to return to ED due to positive blood cultures.  Pharmacy has been consulted for Vancomycin and Unasyn dosing dosing.  Plan: Give vancomycin 1750 mg IV x 1, then start vancomycin 1 gram IV every 24 hours. Estimated AUC 483.6, Cmin 12.9 IBW, Scr 1.17, Vd 0.72 Vancomycin levels at steady state or as clinically indicated. Start Unasyn 3 grams IV every 6 hours Follow renal function for adjustments  Height: 5\' 6"  (167.6 cm) Weight: 76.2 kg (168 lb) IBW/kg (Calculated) : 63.8  Temp (24hrs), Avg:97.9 F (36.6 C), Min:97.9 F (36.6 C), Max:97.9 F (36.6 C)  Recent Labs  Lab 04/01/23 0941 04/03/23 1145 04/03/23 1325  WBC 5.9 5.8  --   CREATININE 1.13  --  1.17  LATICACIDVEN  --  1.8 1.8    Estimated Creatinine Clearance: 40.1 mL/min (by C-G formula based on SCr of 1.17 mg/dL).    Allergies  Allergen Reactions   Quinolones     Aortic dilation contraindicates FQ   Amlodipine Swelling and Other (See Comments)    LE edema   Azithromycin Nausea And Vomiting   Lipitor [Atorvastatin]     Muscle pain in RT Leg    Lisinopril Cough   Zetia [Ezetimibe]     Pain in legs     Antimicrobials this admission: Vancomycin 7/21 >>  Unasyn 7/21 >>  Cefepime x 1 7/21 in ED  Dose adjustments this admission: N/A  Microbiology results: 7/19 BCx: 1/4 (aerobic only) bottles GPR 7/21 BCx: pending   Thank you for allowing pharmacy to be a part of this patient's care.  Barrie Folk, PharmD 04/03/2023 2:26 PM

## 2023-04-03 NOTE — ED Triage Notes (Signed)
Pt via POV after receiving a phone call to inform him that he had positive blood cultures and needed to come to ER for IV abx. Pt has multiple myeloma and has related jaw and back pain. Pt denies SOB.

## 2023-04-03 NOTE — Progress Notes (Signed)
CT soft tissue with-Fluid collection centered around the mandibular symphysis, measuring up to 3.6 cm, suspicious for abscess Dr. Jenne Campus with ENT formally consulted for evaluation Continue with IV Unasyn and vancomycin for infectious coverage per ID recommendations in the setting of positive blood cultures 1 out of 2. Follow

## 2023-04-03 NOTE — Assessment & Plan Note (Signed)
03/2022 CT with a 4.6 cm ascending aortic aneurysm Followed by CT surgery  Pt declining any further intervention

## 2023-04-03 NOTE — Consult Note (Signed)
PHARMACY -  BRIEF ANTIBIOTIC NOTE   Pharmacy has received consult(s) for vancomycin dosing from an ED provider.  The patient's profile has been reviewed for ht/wt/allergies/indication/available labs.    One time order(s) placed for Vancomycin 1750 mg IV x 1  Further antibiotics/pharmacy consults should be ordered by admitting physician if indicated.                       Thank you, Barrie Folk, PharmD 04/03/2023  1:11 PM

## 2023-04-03 NOTE — Consult Note (Signed)
Jack [name], Jack [name] [id number] [birth year] Jack Flock, MD  Reason for Consult: Submental abscess  HPI: Patient presented to the emergency room on Friday was seen to have some submental swelling no significant floor mouth issues at that time CT scan showed soft tissue swelling with possible submental abscess.  He was having no significant airway issues and no floor mouth swelling.  Spoke with the ER physician on Friday he was given IV antibiotics and steroids appear to be feeling some better and was sent home on clindamycin.  According to his daughter and to the patient the swelling and erythema has improved significantly on the clindamycin.  I actually reviewed a picture that his daughter had compared with the way he looks today and indeed the erythema and swelling has significantly decreased from Friday.  He had a positive culture for a gram-positive rod was called back to the hospital for possible sepsis.  Repeat CT showed the area of concern of abscess unchanged.  Tissue swelling somewhat improved.  Allergies:  Allergies  Allergen Reactions   Quinolones     Aortic dilation contraindicates FQ   Amlodipine Swelling and Other (See Comments)    LE edema   Azithromycin Nausea And Vomiting   Lipitor [Atorvastatin]     Muscle pain in RT Leg    Lisinopril Cough   Zetia [Ezetimibe]     Pain in legs     ROS: Review of systems normal other than 12 systems except per HPI.  PMH:  Past Medical History:  Diagnosis Date   Ascending aortic aneurysm (HCC)    a. 12/2020 Echo: Asc Ao 48mm, Ao root 40mm. b. 03/2022 Asc Ao 4.6 cm (4.5 cm in 2019)   CKD (chronic kidney disease), stage III (HCC)    Compression fracture of body of thoracic vertebra (HCC)    Diastolic dysfunction    a. 12/2020 Echo: EF 50-55%, no rwma, mild LVH, GrI DD, nl RV fxn, mild AI. Asc Ao 48mm, Ao root 40mm.   Elevated prostate specific antigen (PSA)    has been 7 for a year    GERD (gastroesophageal reflux disease)    History of  colon polyps 2008   [facility],    History of kidney stones    Hyperlipidemia    Hypertension    Myeloma (HCC)    PAF (paroxysmal atrial fibrillation) (HCC)    a. 01/2021-->Eliquis (CHA2DS2VASc = 3-4).   Pain    Prostate hypertrophy    diagnosed at age 89 due to hematospermia    FH:  Family History  Problem Relation Age of Onset   Hypertension Father    Hyperlipidemia Father    Cancer Sister        thyroid - dx in late 20's    SH:  Social History   Socioeconomic History   Marital status: Married    Spouse name: Paramedic   Number of children: 4   Years of education: 16   Highest education level: Not on file  Occupational History   Occupation: Retired    Associate Professor: retired    Comment: Real Estate   Tobacco Use   Smoking status: Former    Current packs/day: 0.00    Types: Cigarettes    Quit date: 07/05/1965    Years since quitting: 57.7   Smokeless tobacco: Current   Tobacco comments:    Occasional cigar  Vaping Use   Vaping status: Never Used  Substance and Sexual Activity   Alcohol use: Not Currently  Alcohol/week: 1.0 standard drink of alcohol    Types: 1 Standard drinks or equivalent per week    Comment: occassionaly   Drug use: No   Sexual activity: Yes    Birth control/protection: None  Other Topics Concern   Not on file  Social History Narrative   Not on file   Social Determinants of Health   Financial Resource Strain: Low Risk  (01/03/2023)   [facility], [facility]   Overall Financial Resource Strain (CARDIA)    Difficulty of Paying Living Expenses: Not hard at all  Food Insecurity: No Food Insecurity (04/03/2023)   Hunger Vital Sign    Worried About Running Out of Food in the Last Year: Never true    Ran Out of Food in the Last Year: Never true  Transportation Needs: No Transportation Needs (04/03/2023)   PRAPARE - Administrator, Civil Service (Medical): No    Lack of  Transportation (Non-Medical): No  Physical Activity: Not on file  Stress: Not on file  Social Connections: Not on file  Intimate Partner Violence: Not At Risk (04/03/2023)   Humiliation, Afraid, Rape, and Kick questionnaire    Fear of Current or Ex-Partner: No    Emotionally Abused: No    Physically Abused: No    Sexually Abused: No    PSH:  Past Surgical History:  Procedure Laterality Date   CATARACT EXTRACTION W/PHACO Left 01/10/2018   Procedure: CATARACT EXTRACTION PHACO AND INTRAOCULAR LENS PLACEMENT (IOC);  Surgeon: Galen Manila, MD;  Location: [facility];  Service: Ophthalmology;  Laterality: Left;  [name] 00:24.8 AP% 14.9 CDE 3.68 Fluid pack lot # [id number]   CATARACT EXTRACTION W/PHACO Right 01/25/2018   Procedure: CATARACT EXTRACTION PHACO AND INTRAOCULAR LENS PLACEMENT (IOC);  Surgeon: Galen Manila, MD;  Location: [facility];  Service: Ophthalmology;  Laterality: Right;  [name] 00:42 AP% 10.8 CDE 4.59 Fluid pack lot # [id number]   COLON SURGERY     CYSTOSCOPY W/ URETERAL STENT PLACEMENT Right 10/16/2017   Procedure: right  URETERAL STENT PLACEMENT,cystoscopy bilateral stent removal,rretrograde;  Surgeon: Riki Altes, MD;  Location: [facility];  Service: Urology;  Laterality: Right;   CYSTOSCOPY/URETEROSCOPY/HOLMIUM LASER/STENT PLACEMENT Right 12/16/2020   Procedure: CYSTOSCOPY/URETEROSCOPY/HOLMIUM LASER/STENT PLACEMENT;  Surgeon: Riki Altes, MD;  Location: [facility];  Service: Urology;  Laterality: Right;   EXTRACORPOREAL SHOCK WAVE LITHOTRIPSY Right 12/11/2020   Procedure: EXTRACORPOREAL SHOCK WAVE LITHOTRIPSY (ESWL);  Surgeon: Riki Altes, MD;  Location: [facility];  Service: Urology;  Laterality: Right;   IR BONE TUMOR(S)RF ABLATION  04/16/2022   IR KYPHO EA ADDL LEVEL THORACIC OR LUMBAR  02/02/2021   IR KYPHO LUMBAR INC FX REDUCE BONE BX UNI/BIL CANNULATION INC/IMAGING  02/02/2021   IR KYPHO THORACIC WITH BONE BIOPSY  04/15/2022   KIDNEY STONE SURGERY     KYPHOPLASTY N/A  03/12/2021   Procedure: Nicki Reaper, L3;  Surgeon: Kennedy Bucker, MD;  Location: [facility];  Service: Orthopedics;  Laterality: N/A;   RESECTION SOFT TISSUE TUMOR LEG / ANKLE RADICAL  jan 2009   [facility],  right thigh/knee , nonmalignant   SMALL INTESTINE SURGERY  1946   implaed on picket fence, punctured stomach   TONSILLECTOMY      Physical  Exam: Patient is awake and alert no voice changes no airway compromise.  The anterior nose is benign the external ears appear clear the oral cavity oropharynx was benign he does have his full dentitio of the mandible with  some periodontal disease of the incisors anteriorly.  There is obvious submental swelling and tenderness, although the erythema when compared to previous photograph is markedly improved.  CT scan review shows fluid collection in the submental region adjacent to the mandible.  Assessment: Submental abscess-based on the photography from his daughter the swelling does appear to be improved.  However he continues to have pain.  And with his positive blood culture I recommended aspiration of this abscess.  He and his daughter agreed.  Procedure: After consent from the patient the submental region was prepped with Betadine.  A local anesthetic of 1% lidocaine with 1 100000 units of epinephrine was used to inject over the abscess site.  A total of 2 and half cc was used.  After 5 minutes an 18-gauge needle with a 10 cc syringe was used to probe the abscess.  Immediately the abscess cavity was entered and approximately 5 to 6 cc of pus was aspirated from the abscess. This immediately release the tension of the submental region.  The needle was removed and the abscess contents were sent for culture.  A Band-Aid was placed over the aspiration site.  Plan: Will continue IV antibiotics and steroids overnight.  Continue the Unasyn and vancomycin until culture results have returned.  I have sent the aspirate for culture and sensitivity as well.  He is having  significant pain I suspect some of that is related to the multiple myeloma and the fact that he has not been receiving the medication for pain which she has been taking at home for quite some time.  I will also let Dr. Orlie Dakin know that he is in the hospital as well.  Will proceed with a diet tonight let him be n.p.o. after midnight in case the abscess gets worse and we will require incision and drainage in the operating room.  Family is aware and is in favor of this plan.  Davina Poke 04/03/2023 6:07 PM

## 2023-04-03 NOTE — Progress Notes (Signed)
Order received from ENT Dr Jenne Campus to make the patient NPO after midnight

## 2023-04-03 NOTE — ED Notes (Signed)
Advised nurse that patient has ready bed 

## 2023-04-03 NOTE — Assessment & Plan Note (Signed)
PPI ?

## 2023-04-04 ENCOUNTER — Ambulatory Visit: Payer: PPO | Admitting: Physical Therapy

## 2023-04-04 DIAGNOSIS — K122 Cellulitis and abscess of mouth: Secondary | ICD-10-CM | POA: Diagnosis not present

## 2023-04-04 DIAGNOSIS — R7881 Bacteremia: Secondary | ICD-10-CM | POA: Diagnosis not present

## 2023-04-04 DIAGNOSIS — C9000 Multiple myeloma not having achieved remission: Secondary | ICD-10-CM | POA: Diagnosis not present

## 2023-04-04 LAB — CBC
HCT: 30.8 % — ABNORMAL LOW (ref 39.0–52.0)
Hemoglobin: 10.1 g/dL — ABNORMAL LOW (ref 13.0–17.0)
MCH: 32.3 pg (ref 26.0–34.0)
MCHC: 32.8 g/dL (ref 30.0–36.0)
MCV: 98.4 fL (ref 80.0–100.0)
Platelets: 77 10*3/uL — ABNORMAL LOW (ref 150–400)
RBC: 3.13 MIL/uL — ABNORMAL LOW (ref 4.22–5.81)
RDW: 17.6 % — ABNORMAL HIGH (ref 11.5–15.5)
WBC: 4 10*3/uL (ref 4.0–10.5)
nRBC: 0 % (ref 0.0–0.2)

## 2023-04-04 LAB — COMPREHENSIVE METABOLIC PANEL
ALT: 12 U/L (ref 0–44)
AST: 16 U/L (ref 15–41)
Albumin: 2.9 g/dL — ABNORMAL LOW (ref 3.5–5.0)
Alkaline Phosphatase: 70 U/L (ref 38–126)
Anion gap: 6 (ref 5–15)
BUN: 27 mg/dL — ABNORMAL HIGH (ref 8–23)
CO2: 21 mmol/L — ABNORMAL LOW (ref 22–32)
Calcium: 7.7 mg/dL — ABNORMAL LOW (ref 8.9–10.3)
Chloride: 109 mmol/L (ref 98–111)
Creatinine, Ser: 1 mg/dL (ref 0.61–1.24)
GFR, Estimated: >60 mL/min (ref >60)
Glucose, Bld: 167 mg/dL — ABNORMAL HIGH (ref 70–99)
Potassium: 4.4 mmol/L (ref 3.5–5.1)
Sodium: 136 mmol/L (ref 135–145)
Total Bilirubin: 0.5 mg/dL (ref 0.3–1.2)
Total Protein: 5.4 g/dL — ABNORMAL LOW (ref 6.5–8.1)

## 2023-04-04 LAB — AEROBIC/ANAEROBIC CULTURE W GRAM STAIN (SURGICAL/DEEP WOUND)

## 2023-04-04 LAB — CULTURE, BLOOD (ROUTINE X 2)
Culture: NO GROWTH
Special Requests: ADEQUATE
Special Requests: ADEQUATE

## 2023-04-04 LAB — CULTURE, BLOOD (SINGLE): Special Requests: ADEQUATE

## 2023-04-04 MED ORDER — HYDRALAZINE HCL 20 MG/ML IJ SOLN: 2.0000 mg | INTRAMUSCULAR | Status: DC | PRN

## 2023-04-04 MED ORDER — POLYETHYLENE GLYCOL 3350 17 G PO PACK
17.0000 g | PACK | Freq: Two times a day (BID) | ORAL | Status: DC
  Administered 2023-04-05 – 2023-04-06 (×3): 17 g via ORAL
  Filled 2023-04-04 (×4): qty 1

## 2023-04-04 MED ORDER — GABAPENTIN 300 MG PO CAPS
600.0000 mg | ORAL_CAPSULE | Freq: Three times a day (TID) | ORAL | Status: DC
  Administered 2023-04-04 – 2023-04-06 (×6): 600 mg via ORAL
  Filled 2023-04-04 (×6): qty 2

## 2023-04-04 MED ORDER — APIXABAN 5 MG PO TABS
5.0000 mg | ORAL_TABLET | Freq: Two times a day (BID) | ORAL | Status: DC
  Administered 2023-04-04 – 2023-04-06 (×5): 5 mg via ORAL
  Filled 2023-04-04 (×5): qty 1

## 2023-04-04 MED ORDER — LIDOCAINE 5 % EX OINT: TOPICAL_OINTMENT | Freq: Three times a day (TID) | CUTANEOUS | Status: DC | PRN

## 2023-04-04 MED ORDER — SENNOSIDES-DOCUSATE SODIUM 8.6-50 MG PO TABS
1.0000 | ORAL_TABLET | Freq: Two times a day (BID) | ORAL | Status: DC
  Administered 2023-04-04 – 2023-04-06 (×4): 1 via ORAL
  Filled 2023-04-04 (×4): qty 1

## 2023-04-04 MED ORDER — MORPHINE SULFATE ER 15 MG PO TBCR
30.0000 mg | EXTENDED_RELEASE_TABLET | Freq: Three times a day (TID) | ORAL | Status: DC
  Administered 2023-04-04: 30 mg via ORAL
  Filled 2023-04-04: qty 2

## 2023-04-04 MED ORDER — MORPHINE SULFATE ER 15 MG PO TBCR
30.0000 mg | EXTENDED_RELEASE_TABLET | Freq: Three times a day (TID) | ORAL | Status: DC
  Administered 2023-04-05 – 2023-04-06 (×6): 30 mg via ORAL
  Filled 2023-04-04 (×6): qty 2

## 2023-04-04 MED ORDER — ENSURE ENLIVE PO LIQD
237.0000 mL | Freq: Three times a day (TID) | ORAL | Status: DC
  Administered 2023-04-04 – 2023-04-05 (×3): 237 mL via ORAL

## 2023-04-04 MED ORDER — LENALIDOMIDE 20 MG PO CAPS: 20.0000 mg | ORAL_CAPSULE | Freq: Every day | ORAL | Status: DC

## 2023-04-04 MED ORDER — GABAPENTIN 300 MG PO CAPS
300.0000 mg | ORAL_CAPSULE | Freq: Three times a day (TID) | ORAL | Status: DC
  Filled 2023-04-04: qty 1

## 2023-04-04 MED ORDER — CARVEDILOL 12.5 MG PO TABS
12.5000 mg | ORAL_TABLET | Freq: Two times a day (BID) | ORAL | Status: DC
  Filled 2023-04-04: qty 1

## 2023-04-04 NOTE — Consult Note (Signed)
NAME: Jack [name]  DOB: Dec 24, 1932  MRN: [id number]  Date/Time: 04/04/2023 9:35 AM  REQUESTING PROVIDER: Dr.Nelson Subjective:  REASON FOR CONSULT: bacteremia ? Jack [name] is a 89 y.o. with a history of kappa chain  myeloma on Revlimid and Xgeva,  multiple vertebral compression fractures, hypertension, CKD, A-fib on Eliquis, anemia presented to his PCP at [facility] on 04/01/2023 with left  jaw pain of 3 months duration and swelling for 1 week.  He had been to his dentist and dental cause was ruled out.  He was referred to the emergency department On 04/01/2023 his BP was 1 5477, temperature was 98.1, pulse 9059, and respiratory rate of 18.  Labs revealed WBC of 5.9, hemoglobin 11.3, platelet 81, BUN 28, creatinine 1.13. He had a CT maxillofacial with contrast and that showed left mandibular dental implants, anchors were unremarkable, a peripherally enhancing fluid collection extends inferiorly and anteriorly from the anterior genu of the mandible measuring 0.7-3 0.5 to 10 mm.  The collection was not extending to the floor of the mouth.  No breach of the mylohyoid was evident.  He was sent home on clindamycin and steroids after the ED physician discussed with ENT.  Cultures were sent before he was discharged. It came back as gram-positive rods and hence patient was recalled to the ED on 04/03/2023 and I am asked to see the patient for the bacteremia. Vitals in the ED on 04/03/2023 was temperature of 98.3, BP 102 55, pulse 71 and respiratory rate 19. WBC was 5.8, Hb 11.8, platelet 96 and creatinine 1.17. Repeat CT scan of the neck and face was done and that showed a fluid collection centered around the mandibular symphysis measuring 3.6 into 2.4 into 2.6 cm with surrounding inflammation suspicious for abscess.  There was no underlying bony abnormality.  There was also noted heterogenous sclerosis of the C7 vertebral body and T1 superior endplate unchanged from the prior thoracic spine MRI and  likely reflecting treated myeloma.  He was seen by ENT Dr. Jenne Campus And had the submental abscess was aspirated and 5 to 6 cc of pus was taken out.  It was sent for culture.  Patient has been on Unasyn and vancomycin.  Past Medical History:  Diagnosis Date   Ascending aortic aneurysm (HCC)    a. 12/2020 Echo: Asc Ao 48mm, Ao root 40mm. b. 03/2022 Asc Ao 4.6 cm (4.5 cm in 2019)   CKD (chronic kidney disease), stage III (HCC)    Compression fracture of body of thoracic vertebra (HCC)    Diastolic dysfunction    a. 12/2020 Echo: EF 50-55%, no rwma, mild LVH, GrI DD, nl RV fxn, mild AI. Asc Ao 48mm, Ao root 40mm.   Elevated prostate specific antigen (PSA)    has been 7 for a year    GERD (gastroesophageal reflux disease)    History of colon polyps 2008   [facility],    History of kidney stones    Hyperlipidemia    Hypertension    Myeloma (HCC)    PAF (paroxysmal atrial fibrillation) (HCC)    a. 01/2021-->Eliquis (CHA2DS2VASc = 3-4).   Pain    Prostate hypertrophy    diagnosed at age 69 due to hematospermia    Past Surgical History:  Procedure Laterality Date   CATARACT EXTRACTION W/PHACO Left 01/10/2018   Procedure: CATARACT EXTRACTION PHACO AND INTRAOCULAR LENS PLACEMENT (IOC);  Surgeon: Galen Manila, MD;  Location: [facility] ORS;  Service: Ophthalmology;  Laterality: Left;  [name] 00:24.8 AP%  14.9 CDE 3.68 Fluid pack lot # [id number]   CATARACT EXTRACTION W/PHACO Right 01/25/2018   Procedure: CATARACT EXTRACTION PHACO AND INTRAOCULAR LENS PLACEMENT (IOC);  Surgeon: Galen Manila, MD;  Location: [facility] OR[facility];  Service: Ophthalmology;  Laterality: Right;  [name] 00:42 AP% 10.8 CDE 4.59 Fluid pack lot # [id number]   COLON SURGERY     CYSTOSCOPY W/ URETERAL STENT PLACEMENT Right 10/16/2017   Procedure: right  URETERAL STENT PLACEMENT,cystoscopy bilateral stent removal,rretrograde;  Surgeon: Riki Altes, MD;  Location: [facility] OR[facility];  Service: Urology;  Laterality: Right;    CYSTOSCOPY/URETEROSCOPY/HOLMIUM LASER/STENT PLACEMENT Right 12/16/2020   Procedure: CYSTOSCOPY/URETEROSCOPY/HOLMIUM LASER/STENT PLACEMENT;  Surgeon: Riki Altes, MD;  Location: [facility] OR[facility];  Service: Urology;  Laterality: Right;   EXTRACORPOREAL SHOCK WAVE LITHOTRIPSY Right 12/11/2020   Procedure: EXTRACORPOREAL SHOCK WAVE LITHOTRIPSY (ESWL);  Surgeon: Riki Altes, MD;  Location: [facility] OR[facility];  Service: Urology;  Laterality: Right;   IR BONE TUMOR(S)RF ABLATION  04/16/2022   IR KYPHO EA ADDL LEVEL THORACIC OR LUMBAR  02/02/2021   IR KYPHO LUMBAR INC FX REDUCE BONE BX UNI/BIL CANNULATION INC/IMAGING  02/02/2021   IR KYPHO THORACIC WITH BONE BIOPSY  04/15/2022   KIDNEY STONE SURGERY     KYPHOPLASTY N/A 03/12/2021   Procedure: Nicki Reaper, L3;  Surgeon: Kennedy Bucker, MD;  Location: [facility] OR[facility];  Service: Orthopedics;  Laterality: N/A;   RESECTION SOFT TISSUE TUMOR LEG / ANKLE RADICAL  jan 2009   [facility],  right thigh/knee , nonmalignant   SMALL INTESTINE SURGERY  1946   implaed on picket fence, punctured stomach   TONSILLECTOMY      Social History   Socioeconomic History   Marital status: Married    Spouse name: Paramedic   Number of children: 4   Years of education: 16   Highest education level: Not on file  Occupational History   Occupation: Retired    Associate Professor: retired    Comment: Real Estate   Tobacco Use   Smoking status: Former    Current packs/day: 0.00    Types: Cigarettes    Quit date: 07/05/1965    Years since quitting: 57.7   Smokeless tobacco: Current   Tobacco comments:    Occasional cigar  Vaping Use   Vaping status: Never Used  Substance and Sexual Activity   Alcohol use: Not Currently    Alcohol/week: 1.0 standard drink of alcohol    Types: 1 Standard drinks or equivalent per week    Comment: occassionaly   Drug use: No   Sexual activity: Yes    Birth control/protection: None  Other Topics Concern   Not on file  Social History Narrative   Not on file   Social  Determinants of Health   Financial Resource Strain: Low Risk  (01/03/2023)   [facility], [facility]   Overall Financial Resource Strain (CARDIA)    Difficulty of Paying Living Expenses: Not hard at all  Food Insecurity: No Food Insecurity (04/03/2023)   Hunger Vital Sign    Worried About Running Out of Food in the Last Year: Never true    Ran Out of Food in the Last Year: Never true  Transportation Needs: No Transportation Needs (04/03/2023)   PRAPARE - Administrator, Civil Service (Medical): No    Lack of Transportation (Non-Medical): No  Physical Activity: Not on file  Stress: Not on file  Social Connections: Not on file  Intimate Partner Violence: Not At Risk (04/03/2023)  Humiliation, Afraid, Rape, and Kick questionnaire    Fear of Current or Ex-Partner: No    Emotionally Abused: No    Physically Abused: No    Sexually Abused: No    Family History  Problem Relation Age of Onset   Hypertension Father    Hyperlipidemia Father    Cancer Sister        thyroid - dx in late 20's   Allergies  Allergen Reactions   Quinolones     Aortic dilation contraindicates FQ   Amlodipine Swelling and Other (See Comments)    LE edema   Azithromycin Nausea And Vomiting   Lipitor [Atorvastatin]     Muscle pain in RT Leg    Lisinopril Cough   Zetia [Ezetimibe]     Pain in legs    I? Current Facility-Administered Medications  Medication Dose Route Frequency Provider Last Rate Last Admin   0.9 %  sodium chloride infusion   Intravenous Continuous [name], MD 50 mL/hr at 04/04/23 0734 Infusion Verify at 04/04/23 0734   Ampicillin-Sulbactam (UNASYN) 3 g in sodium chloride 0.9 % 100 mL IVPB  3 g Intravenous Q6H [name], MD 200 mL/hr at 04/04/23 0811 3 g at 04/04/23 0811   dexamethasone (DECADRON) injection 10 mg  10 mg Intravenous Q8H [name], MD   10 mg at 04/04/23 7253   HYDROmorphone (DILAUDID)  injection 1 mg  1 mg Intravenous Q3H PRN [name], MD       ondansetron [facility]) tablet 4 mg  4 mg Oral Q6H PRN [name], MD       Or   ondansetron [facility]) injection 4 mg  4 mg Intravenous Q6H PRN [name], MD       oxyCODONE (Oxy IR/ROXICODONE) immediate release tablet 10 mg  10 mg Oral Q4H PRN [name], MD   10 mg at 04/03/23 1801   vancomycin (VANCOCIN) IVPB 1000 mg/200 mL premix  1,000 mg Intravenous Q24H Prince Solian F, RPH         Abtx:  Anti-infectives (From admission, onward)    Start     Dose/Rate Route Frequency Ordered Stop   04/04/23 1500  vancomycin (VANCOCIN) IVPB 1000 mg/200 mL premix        1,000 mg 200 mL/hr over 60 Minutes Intravenous Every 24 hours 04/03/23 1425     04/03/23 2000  piperacillin-tazobactam (ZOSYN) IVPB 3.375 g  Status:  Discontinued        3.375 g 12.5 mL/hr over 240 Minutes Intravenous Every 8 hours 04/03/23 1425 04/03/23 1504   04/03/23 2000  Ampicillin-Sulbactam (UNASYN) 3 g in sodium chloride 0.9 % 100 mL IVPB        3 g 200 mL/hr over 30 Minutes Intravenous Every 6 hours 04/03/23 1505     04/03/23 1315  vancomycin (VANCOREADY) IVPB 1750 mg/350 mL        1,750 mg 175 mL/hr over 120 Minutes Intravenous  Once 04/03/23 1309 04/03/23 1731   04/03/23 1300  ceFEPIme (MAXIPIME) 2 g in sodium chloride 0.9 % 100 mL IVPB        2 g 200 mL/hr over 30 Minutes Intravenous  Once 04/03/23 1259 04/03/23 1400   04/03/23 1230  ceFEPIme (MAXIPIME) 2 g in sodium chloride 0.9 % 100 mL IVPB  Status:  Discontinued        2 g 200 mL/hr over 30 Minutes Intravenous  Once 04/03/23 1224 04/03/23 1238  REVIEW OF SYSTEMS:  Const: negative fever, negative chills, negative weight loss Eyes: negative diplopia or visual changes, negative eye pain ENT: swelling over the chin X 1 week Pain X 3 months Resp: negative cough, hemoptysis, dyspnea Cards: negative for chest pain, palpitations, lower extremity edema GU: negative for  frequency, dysuria and hematuria GI: Negative for abdominal pain, diarrhea, bleeding, constipation Skin: negative for rash and pruritus Heme: negative for easy bruising and gum/nose bleeding MS: back pain Neurolo:negative for headaches, dizziness, vertigo, memory problems  Psych: negative for feelings of anxiety, depression  Endocrine:  N Allergy/Immunology- as above ?  Objective:  VITALS:  BP (!) 147/66 (BP Location: Left Arm)   Pulse (!) 55   Temp 97.9 F (36.6 C) (Oral)   Resp 18   Ht 5\' 6"  (1.676 m)   Wt 76.2 kg   SpO2 99%   BMI 27.12 kg/m   PHYSICAL EXAM:  General: Alert, cooperative, no distress, appears stated age.  Head: Normocephalic, without obvious abnormality, atraumatic. Eyes: Conjunctivae clear, anicteric sclerae. Pupils are equal ENT Nares normal. No drainage or sinus tenderness. Below chin- abscess I/D Lips, mucosa, and tongue normal. No Thrush Neck:  symmetrical, no adenopathy, thyroid: non tender no carotid bruit and no JVD. Back: No CVA tenderness. Lungs: b/l air entry Heart: irregular. Abdomen: Soft, non-tender,not distended. Bowel sounds normal. No masses Extremities: atraumatic, no cyanosis. No edema. No clubbing Skin: No rashes or lesions. Or bruising Lymph: Cervical, supraclavicular normal. Neurologic: Grossly non-focal Pertinent Labs Lab Results CBC    Component Value Date/Time   WBC 4.0 04/04/2023 0433   RBC 3.13 (L) 04/04/2023 0433   HGB 10.1 (L) 04/04/2023 0433   HGB 11.0 (L) 02/24/2021 1014   HCT 30.8 (L) 04/04/2023 0433   HCT 35.3 (L) 02/24/2021 1014   PLT 77 (L) 04/04/2023 0433   PLT 249 02/24/2021 1014   MCV 98.4 04/04/2023 0433   MCV 96 02/24/2021 1014   MCV 90 01/24/2013 0819   MCH 32.3 04/04/2023 0433   MCHC 32.8 04/04/2023 0433   RDW 17.6 (H) 04/04/2023 0433   RDW 15.7 (H) 02/24/2021 1014   RDW 12.9 01/24/2013 0819   LYMPHSABS 0.9 04/03/2023 1145   LYMPHSABS 1.2 01/24/2013 0819   MONOABS 1.0 04/03/2023 1145   MONOABS  0.3 01/24/2013 0819   EOSABS 0.0 04/03/2023 1145   EOSABS 0.0 01/24/2013 0819   BASOSABS 0.0 04/03/2023 1145   BASOSABS 0.0 01/24/2013 0819       Latest Ref Rng & Units 04/04/2023    4:33 AM 04/03/2023    1:25 PM 04/01/2023    9:41 AM  CMP  Glucose 70 - 99 mg/dL 161  096  045   BUN 8 - 23 mg/dL 27  38  28   Creatinine 0.61 - 1.24 mg/dL 4.09  8.11  9.14   Sodium 135 - 145 mmol/L 136  139  140   Potassium 3.5 - 5.1 mmol/L 4.4  4.6  4.3   Chloride 98 - 111 mmol/L 109  106  103   CO2 22 - 32 mmol/L 21  26  26    Calcium 8.9 - 10.3 mg/dL 7.7  8.2  8.3   Total Protein 6.5 - 8.1 g/dL 5.4  6.2  6.1   Total Bilirubin 0.3 - 1.2 mg/dL 0.5  0.4  1.1   Alkaline Phos 38 - 126 U/L 70  80  88   AST 15 - 41 U/L 16  18  17    ALT 0 -  44 U/L 12  13  14        Microbiology: Recent Results (from the past 240 hour(s))  Culture, blood (single)     Status: None (Preliminary result)   Collection Time: 04/01/23  3:14 PM   Specimen: BLOOD  Result Value Ref Range Status   Specimen Description   Final    BLOOD BLOOD LEFT ARM Performed at [facility], [address], [place], [place] [address]    Special Requests   Final    BOTTLES DRAWN AEROBIC AND ANAEROBIC Blood Culture adequate volume Performed at [facility], [address], [place], [place] [address]    Culture  Setup Time   Final    GRAM POSITIVE RODS IN BOTH AEROBIC AND ANAEROBIC BOTTLES CRITICAL RESULT CALLED TO, READ BACK BY AND VERIFIED WITH:  ASHLEY MURRAY 1034 04/03/2023 CP Performed at [facility], [address] [place], [place] [address]    Culture   Final    CULTURE REINCUBATED FOR BETTER GROWTH Performed at [facility], [address], [place], [place] [address]    Report Status PENDING  Incomplete  Culture, blood (routine x 2)     Status: None (Preliminary result)   Collection Time: 04/01/23  3:36 PM   Specimen: BLOOD  Result Value Ref Range Status   Specimen Description BLOOD BLOOD  LEFT ARM  Final   Special Requests   Final    BOTTLES DRAWN AEROBIC AND ANAEROBIC Blood Culture results may not be optimal due to an inadequate volume of blood received in culture bottles   Culture   Final    NO GROWTH 3 DAYS Performed at [facility], [address] [place], [place] [address]    Report Status PENDING  Incomplete  Culture, blood (routine x 2)     Status: None (Preliminary result)   Collection Time: 04/03/23  1:25 PM   Specimen: BLOOD  Result Value Ref Range Status   Specimen Description BLOOD BLOOD RIGHT FOREARM  Final   Special Requests   Final    BOTTLES DRAWN AEROBIC AND ANAEROBIC Blood Culture adequate volume   Culture   Final    NO GROWTH < 24 HOURS Performed at [facility], [address], [place], [place] [address]    Report Status PENDING  Incomplete  Culture, blood (routine x 2)     Status: None (Preliminary result)   Collection Time: 04/03/23  1:25 PM   Specimen: BLOOD  Result Value Ref Range Status   Specimen Description BLOOD RIGHT ANTECUBITAL  Final   Special Requests   Final    BOTTLES DRAWN AEROBIC AND ANAEROBIC Blood Culture adequate volume   Culture   Final    NO GROWTH < 24 HOURS Performed at [facility], [address] [place], [place] [address]    Report Status PENDING  Incomplete  Aerobic/Anaerobic Culture w Gram Stain (surgical/deep wound)     Status: None (Preliminary result)   Collection Time: 04/03/23  6:02 PM   Specimen: Soft Tissue Abscess  Result Value Ref Range Status   Specimen Description   Final    Abscess S T Performed at [facility], [address] [place], [place] [address]    Special Requests   Final    NONE Performed at [facility], [address] [place], [place] [address]    Gram Stain   Final    FEW WBC PRESENT, PREDOMINANTLY MONONUCLEAR FEW GRAM POSITIVE COCCI IN PAIRS  Culture   Final    NO GROWTH < 12 HOURS Performed at [facility], [address], [place], [place] [address]    Report Status PENDING  Incomplete    IMAGING RESULTS: I have personally reviewed the films ? Impression/Recommendation Submental abscess.  Has been aspirated and sent for culture Gram stain shows gram-positive cocci in pairs which likely could be Streptococcus or Staphylococcus Patient is on Unasyn and vancomycin There is no evidence of osteomyelitis of the mandibular bone  Gram-positive bacteremia.  Gram-positive rods seen in 1 set of blood cultures. ID still pending The usual gram-positive rods are Corynebacterium, diphtheroids, Propionibacterium, lactobacillus etc. Bacillus, Listeria are unlikely , ? Kappa chain myeloma On Revlimid  Hypertension  CKD  ___________________________________________________ Discussed with patient, and wife and requesting provider Note:  This document was prepared using Dragon voice recognition software and may include unintentional dictation errors.

## 2023-04-04 NOTE — Progress Notes (Signed)
       CROSS COVER NOTE  NAME: Jack [name] MRN: [id number] DOB : [birth year]    Concern as stated by nurse / staff   Pt BP is 171/80 HR 54. Unable to give home med Coreg 12.5mg  that was ordered for 1830 due to HR. ... Can we give him something for his BP? I am going to mark Coreg as not given. Thanks      Pertinent findings on chart review: Last progress note, recent vitals and med list reviewed Patient here for gram-positive bacteremia BP was on the low side earlier in the day with systolic 102 and gradually increased throughout the afternoon Heart rate in the mid to high 40s to 50s    04/04/2023    7:36 PM 04/04/2023    5:28 PM 04/04/2023    7:38 AM  Vitals with BMI  Systolic 171 184 295  Diastolic 80 76 66  Pulse 54 51 55     Assessment and  Interventions   Assessment: Borderline elevated blood pressure, with history of borderline low blood pressures earlier Stable sinus bradycardia  Plan: Okay to hold Coreg Low-dose hydralazine as needed r

## 2023-04-04 NOTE — Plan of Care (Signed)

## 2023-04-04 NOTE — Progress Notes (Signed)
04/04/2023 8:07 AM Jack [name] [id number]  Post-Op Day 1    Temp:  [97.8 F (36.6 C)-98.4 F (36.9 C)] 97.9 F (36.6 C) (07/22 0738) Pulse Rate:  [47-71] 55 (07/22 0738) Resp:  [16-22] 18 (07/22 0738) BP: (102-149)/(55-96) 147/66 (07/22 0738) SpO2:  [96 %-99 %] 99 % (07/22 0738) Weight:  [76.2 kg] 76.2 kg (07/21 1132),     Intake/Output Summary (Last 24 hours) at 04/04/2023 0807 Last data filed at 04/04/2023 0734 Gross per 24 hour  Intake 1548.86 ml  Output 1700 ml  Net -151.14 ml    Results for orders placed or performed during the hospital encounter of 04/03/23 (from the past 24 hour(s))  Lactic acid, plasma     Status: None   Collection Time: 04/03/23 11:45 AM  Result Value Ref Range   Lactic Acid, Venous 1.8 0.5 - 1.9 mmol/L  CBC with Differential     Status: Abnormal   Collection Time: 04/03/23 11:45 AM  Result Value Ref Range   WBC 5.8 4.0 - 10.5 K/uL   RBC 3.60 (L) 4.22 - 5.81 MIL/uL   Hemoglobin 11.8 (L) 13.0 - 17.0 g/dL   HCT 78.2 (L) 95.6 - 21.3 %   MCV 100.8 (H) 80.0 - 100.0 fL   MCH 32.8 26.0 - 34.0 pg   MCHC 32.5 30.0 - 36.0 g/dL   RDW 08.6 (H) 57.8 - 46.9 %   Platelets 96 (L) 150 - 400 K/uL   nRBC 0.0 0.0 - 0.2 %   Neutrophils Relative % 66 %   Neutro Abs 3.8 1.7 - 7.7 K/uL   Lymphocytes Relative 16 %   Lymphs Abs 0.9 0.7 - 4.0 K/uL   Monocytes Relative 17 %   Monocytes Absolute 1.0 0.1 - 1.0 K/uL   Eosinophils Relative 1 %   Eosinophils Absolute 0.0 0.0 - 0.5 K/uL   Basophils Relative 0 %   Basophils Absolute 0.0 0.0 - 0.1 K/uL   Immature Granulocytes 0 %   Abs Immature Granulocytes 0.02 0.00 - 0.07 K/uL  Lactic acid, plasma     Status: None   Collection Time: 04/03/23  1:25 PM  Result Value Ref Range   Lactic Acid, Venous 1.8 0.5 - 1.9 mmol/L  Urinalysis, w/ Reflex to Culture (Infection Suspected) -Urine, Clean Catch     Status: Abnormal   Collection Time: 04/03/23  1:25 PM  Result Value Ref Range   Specimen Source URINE, CATHETERIZED     Color, Urine YELLOW (A) YELLOW   APPearance HAZY (A) CLEAR   Specific Gravity, Urine 1.021 1.005 - 1.030   pH 5.0 5.0 - 8.0   Glucose, UA 50 (A) NEGATIVE mg/dL   Hgb urine dipstick NEGATIVE NEGATIVE   Bilirubin Urine NEGATIVE NEGATIVE   Ketones, ur NEGATIVE NEGATIVE mg/dL   Protein, ur 30 (A) NEGATIVE mg/dL   Nitrite NEGATIVE NEGATIVE   Leukocytes,Ua NEGATIVE NEGATIVE   RBC / HPF 0-5 0 - 5 RBC/hpf   WBC, UA 0-5 0 - 5 WBC/hpf   Bacteria, UA NONE SEEN NONE SEEN   Squamous Epithelial / HPF NONE SEEN 0 - 5 /HPF   Mucus PRESENT   Comprehensive metabolic panel     Status: Abnormal   Collection Time: 04/03/23  1:25 PM  Result Value Ref Range   Sodium 139 135 - 145 mmol/L   Potassium 4.6 3.5 - 5.1 mmol/L   Chloride 106 98 - 111 mmol/L   CO2 26 22 - 32 mmol/L   Glucose, Bld 120 (H)  70 - 99 mg/dL   BUN 38 (H) 8 - 23 mg/dL   Creatinine, Ser 1.61 0.61 - 1.24 mg/dL   Calcium 8.2 (L) 8.9 - 10.3 mg/dL   Total Protein 6.2 (L) 6.5 - 8.1 g/dL   Albumin 3.5 3.5 - 5.0 g/dL   AST 18 15 - 41 U/L   ALT 13 0 - 44 U/L   Alkaline Phosphatase 80 38 - 126 U/L   Total Bilirubin 0.4 0.3 - 1.2 mg/dL   GFR, Estimated >09 >60 mL/min   Anion gap 7 5 - 15  Culture, blood (routine x 2)     Status: None (Preliminary result)   Collection Time: 04/03/23  1:25 PM   Specimen: BLOOD  Result Value Ref Range   Specimen Description BLOOD BLOOD RIGHT FOREARM    Special Requests      BOTTLES DRAWN AEROBIC AND ANAEROBIC Blood Culture adequate volume   Culture      NO GROWTH < 24 HOURS Performed at [facility], [address] [place], [place] [address]    Report Status PENDING   Culture, blood (routine x 2)     Status: None (Preliminary result)   Collection Time: 04/03/23  1:25 PM   Specimen: BLOOD  Result Value Ref Range   Specimen Description BLOOD RIGHT ANTECUBITAL    Special Requests      BOTTLES DRAWN AEROBIC AND ANAEROBIC Blood Culture adequate volume   Culture      NO GROWTH < 24  HOURS Performed at [facility], [address] [place], [place] [address]    Report Status PENDING   Aerobic/Anaerobic Culture w Gram Stain (surgical/deep wound)     Status: None (Preliminary result)   Collection Time: 04/03/23  6:02 PM   Specimen: Soft Tissue Abscess  Result Value Ref Range   Specimen Description      Abscess S T Performed at [facility], [address], [place], [place] [address]    Special Requests      NONE Performed at [facility], [address] [place], [place] [address]    Gram Stain      FEW WBC PRESENT, PREDOMINANTLY MONONUCLEAR FEW GRAM POSITIVE COCCI IN PAIRS    Culture      NO GROWTH < 12 HOURS Performed at [facility], [address], [place], [place] [address]    Report Status PENDING   CBC     Status: Abnormal   Collection Time: 04/04/23  4:33 AM  Result Value Ref Range   WBC 4.0 4.0 - 10.5 K/uL   RBC 3.13 (L) 4.22 - 5.81 MIL/uL   Hemoglobin 10.1 (L) 13.0 - 17.0 g/dL   HCT 84.6 (L) 96.2 - 95.2 %   MCV 98.4 80.0 - 100.0 fL   MCH 32.3 26.0 - 34.0 pg   MCHC 32.8 30.0 - 36.0 g/dL   RDW 84.1 (H) 32.4 - 40.1 %   Platelets 77 (L) 150 - 400 K/uL   nRBC 0.0 0.0 - 0.2 %  Comprehensive metabolic panel     Status: Abnormal   Collection Time: 04/04/23  4:33 AM  Result Value Ref Range   Sodium 136 135 - 145 mmol/L   Potassium 4.4 3.5 - 5.1 mmol/L   Chloride 109 98 - 111 mmol/L   CO2 21 (L) 22 - 32 mmol/L   Glucose, Bld 167 (H) 70 - 99 mg/dL   BUN 27 (H) 8 - 23 mg/dL   Creatinine, Ser  1.00 0.61 - 1.24 mg/dL   Calcium 7.7 (L) 8.9 - 10.3 mg/dL   Total Protein 5.4 (L) 6.5 - 8.1 g/dL   Albumin 2.9 (L) 3.5 - 5.0 g/dL   AST 16 15 - 41 U/L   ALT 12 0 - 44 U/L   Alkaline Phosphatase 70 38 - 126 U/L   Total Bilirubin 0.5 0.3 - 1.2 mg/dL   GFR, Estimated >43 >32 mL/min   Anion gap 6 5 - 15    SUBJECTIVE: Patient feeling much better this morning.  Slept well.  Pain has significantly improved.  OBJECTIVE:  Band-Aid removed examination of the neck showed significant decrease in swelling and tenderness.  No significant erythema.  IMPRESSION: Status post aspiration of submental abscess.  Gram stain shows gram-positive cocci.  Overall significantly improved.  PLAN: Submental abscess.  Significantly improved.  Continue IV antibiotics with both Unasyn and vancomycin.  Aspiration yesterday Gram stain showing gram-positive cocci.  Based on my exam this morning I do not think the patient will require operative invention.  He is feeling much better.  Would recommend to follow the culture results and treat appropriately.  I will sign off for now.  If symptoms worsen would repeat CT scan and reconsult ENT.  I will create a follow-up with me in 10 days to 2 weeks.  I have contacted Dr. Orlie Dakin and hematology oncology who is aware of his presence in the hospital.  Davina Poke 04/04/2023, 8:07 AM

## 2023-04-04 NOTE — Progress Notes (Signed)
PROGRESS NOTE  Jack [name] GNF:[id number] DOB: 06/15/1933 DOA: 04/03/2023 PCP: Dorothey Baseman, MD  HPI/Recap of past 24 hours: Jack [name] is a 89 y.o. male with medical history significant of multiple myeloma, stage III CKD, GERD, hyperlipidemia, hypertension, atrial fibrillation presenting with bacteremia and submandibular abscess.  History from patient as well as wife at the bedside.  Per report, patient with worsening jaw swelling over the past 1 to 2 months.  Initially saw dentistry still with worsening symptoms.  Noted to be followed by Dr. Orlie Dakin with oncology.  Had one of his medications Rivka Barbara held with concern of it being the source of pain.  However, over the past week or so, patient with worsening redness and swelling of the left lower jaw and submandibular area.  Was seen by urgent care and redirected to the ER on July 19.  Had CT done concerning for submandibular abscess.  Per report, ENT evaluated with recommendation for outpatient antibiotics.  Blood cultures also drawn.  Per the wife, patient has been compliant with antibiotics.  Still with moderate redness and swelling.  No trismus or difficulty swallowing.  No shortness of breath.  Was called back to the ER today because of positive blood culture showing gram-positive rods 1/2 cultures.  Presented to the ER afebrile, hemodynamically stable.  White count 5.8, hemoglobin 11.8, platelets 96, lactate 1.8.  Creatinine 1.17.  CT of the soft tissue neck pending.  Started on empiric antibiotics in the ER.  04/04/23:  The patient was seen and examined at his bedside.  S/p aspiration of submental abscess by ENT, growing Gram stain showing gram-positive cocci.  Currently on Unasyn and IV vancomycin as recommended by ID.  Assessment/Plan: Principal Problem:   Bacteremia Active Problems:   Essential hypertension   GERD   Atrial fibrillation (HCC)   Multiple myeloma (HCC)   Chronic kidney disease, stage 3b (HCC)   Aneurysm of  ascending aorta without rupture (HCC)   Submandibular abscess  Gram-positive rods bacteremia Blood cultures 1/2 with gram-positive rods in the setting of recent evaluation for submandibular abscess with noted concurrent multiple myeloma on immunosuppressive medications Started on IV cefepime and vancomycin in the ER pending speciation and repeat blood cultures Will transition from cefepime to Zosyn per ID preliminary recommendations Will formally consult infectious disease for further evaluation Follow   Submandibular abscess status post aspiration on 04/03/2023 by ENT Persistent left lower jaw redness and swelling despite course of oral antibiotics including clindamycin No active trismus or trouble swallowing per patient but with worsening pain CT soft tissue neck pending to better assess On IV zosyn and vancomycin in setting of bacteremia 1/2 blood cultures with gram-positive rods Follow-up imaging ENT consult as clinically indicated Follow   Aneurysm of ascending aorta without rupture (HCC) 03/2022 CT with a 4.6 cm ascending aortic aneurysm Followed by CT surgery  Pt declining any further intervention    Chronic kidney disease, stage 3b (HCC) Creatinine 1.17 today with GFR greater than 60 Monitor   Multiple myeloma (HCC) Stage II kappa chain myeloma Followed by Dr. Orlie Dakin outpatient Continue Revlimid Continue pain regimen Follow     Atrial fibrillation (HCC) Rate controlled at present Continue home Eliquis, restarted on 04/04/2023.   GERD PPI   Essential hypertension BP stable Titrate home regimen Continue to monitor vital signs  Thrombocytopenia Platelets 77 No reported overt bleeding at this time. Continue to monitor.   Anemia of chronic disease Hemoglobin 10.1 Continue to monitor  Physical debility Ambulates with  a cane PT OT assessment Fall precautions          Advance Care Planning:   Code Status: DNR    Consults: Infectious disease, ENT    Family Communication: None at bedside.      Code Status: DNR.    Procedures: Aspiration by ENT on 04/03/2023   DVT prophylaxis: Home Eliquis  Status is: Inpatient The patient requires at least 2 midnights for further evaluation and treatment of present condition.    Objective: Vitals:   04/03/23 1456 04/03/23 1903 04/04/23 0343 04/04/23 0738  BP: (!) 139/96 (!) 102/55 (!) 149/74 (!) 147/66  Pulse: (!) 55 71 (!) 47 (!) 55  Resp: 18 19 16 18   Temp: 97.8 F (36.6 C) 98.3 F (36.8 C) 98.4 F (36.9 C) 97.9 F (36.6 C)  TempSrc:  Oral Oral Oral  SpO2: 98% 96% 96% 99%  Weight:      Height:        Intake/Output Summary (Last 24 hours) at 04/04/2023 1500 Last data filed at 04/04/2023 1414 Gross per 24 hour  Intake 1728.86 ml  Output 1900 ml  Net -171.14 ml   Filed Weights   04/03/23 1132  Weight: 76.2 kg    Exam:  General: 89 y.o. year-old male well developed well nourished in no acute distress.  Alert and oriented x3. Cardiovascular: Regular rate and rhythm with no rubs or gallops.  No thyromegaly or JVD noted.   Respiratory: Clear to auscultation with no wheezes or rales. Good inspiratory effort. Abdomen: Soft nontender nondistended with normal bowel sounds x4 quadrants. Musculoskeletal: No lower extremity edema. 2/4 pulses in all 4 extremities. Skin: No ulcerative lesions noted or rashes, mild erythema involving left jaw. Psychiatry: Mood is appropriate for condition and setting   Data Reviewed: CBC: Recent Labs  Lab 04/01/23 0941 04/03/23 1145 04/04/23 0433  WBC 5.9 5.8 4.0  NEUTROABS 3.8 3.8  --   HGB 11.3* 11.8* 10.1*  HCT 35.0* 36.3* 30.8*  MCV 100.9* 100.8* 98.4  PLT 81* 96* 77*   Basic Metabolic Panel: Recent Labs  Lab 04/01/23 0941 04/03/23 1325 04/04/23 0433  NA 140 139 136  K 4.3 4.6 4.4  CL 103 106 109  CO2 26 26 21*  GLUCOSE 122* 120* 167*  BUN 28* 38* 27*  CREATININE 1.13 1.17 1.00  CALCIUM 8.3* 8.2* 7.7*   GFR: Estimated  Creatinine Clearance: 47 mL/min (by C-G formula based on SCr of 1 mg/dL). Liver Function Tests: Recent Labs  Lab 04/01/23 0941 04/03/23 1325 04/04/23 0433  AST 17 18 16   ALT 14 13 12   ALKPHOS 88 80 70  BILITOT 1.1 0.4 0.5  PROT 6.1* 6.2* 5.4*  ALBUMIN 3.3* 3.5 2.9*   No results for input(s): "LIPASE", "AMYLASE" in the last 168 hours. No results for input(s): "AMMONIA" in the last 168 hours. Coagulation Profile: No results for input(s): "INR", "PROTIME" in the last 168 hours. Cardiac Enzymes: No results for input(s): "CKTOTAL", "CKMB", "CKMBINDEX", "TROPONINI" in the last 168 hours. BNP (last 3 results) No results for input(s): "PROBNP" in the last 8760 hours. HbA1C: No results for input(s): "HGBA1C" in the last 72 hours. CBG: No results for input(s): "GLUCAP" in the last 168 hours. Lipid Profile: No results for input(s): "CHOL", "HDL", "LDLCALC", "TRIG", "CHOLHDL", "LDLDIRECT" in the last 72 hours. Thyroid Function Tests: No results for input(s): "TSH", "T4TOTAL", "FREET4", "T3FREE", "THYROIDAB" in the last 72 hours. Anemia Panel: No results for input(s): "VITAMINB12", "FOLATE", "FERRITIN", "TIBC", "IRON", "RETICCTPCT" in the  last 72 hours. Urine analysis:    Component Value Date/Time   COLORURINE YELLOW (A) 04/03/2023 1325   APPEARANCEUR HAZY (A) 04/03/2023 1325   APPEARANCEUR Cloudy (A) 06/11/2021 1121   LABSPEC 1.021 04/03/2023 1325   PHURINE 5.0 04/03/2023 1325   GLUCOSEU 50 (A) 04/03/2023 1325   HGBUR NEGATIVE 04/03/2023 1325   BILIRUBINUR NEGATIVE 04/03/2023 1325   BILIRUBINUR Negative 06/11/2021 1121   KETONESUR NEGATIVE 04/03/2023 1325   PROTEINUR 30 (A) 04/03/2023 1325   NITRITE NEGATIVE 04/03/2023 1325   LEUKOCYTESUR NEGATIVE 04/03/2023 1325   Sepsis Labs: @LABRCNTIP (procalcitonin:4,lacticidven:4)  ) Recent Results (from the past 240 hour(s))  Culture, blood (single)     Status: None (Preliminary result)   Collection Time: 04/01/23  3:14 PM    Specimen: BLOOD  Result Value Ref Range Status   Specimen Description   Final    BLOOD BLOOD LEFT ARM Performed at [facility], [address], [place], [place] [address]    Special Requests   Final    BOTTLES DRAWN AEROBIC AND ANAEROBIC Blood Culture adequate volume Performed at [facility], [address] [place], [place] [address]    Culture  Setup Time   Final    GRAM POSITIVE RODS IN BOTH AEROBIC AND ANAEROBIC BOTTLES CRITICAL RESULT CALLED TO, READ BACK BY AND VERIFIED WITH:  ASHLEY MURRAY 1034 04/03/2023 CP Performed at [facility], [address] [place], [place] [address]    Culture   Final    CULTURE REINCUBATED FOR BETTER GROWTH Performed at [facility], [address] [place], [place] [address]    Report Status PENDING  Incomplete  Culture, blood (routine x 2)     Status: None (Preliminary result)   Collection Time: 04/01/23  3:36 PM   Specimen: BLOOD  Result Value Ref Range Status   Specimen Description BLOOD BLOOD LEFT ARM  Final   Special Requests   Final    BOTTLES DRAWN AEROBIC AND ANAEROBIC Blood Culture results may not be optimal due to an inadequate volume of blood received in culture bottles   Culture   Final    NO GROWTH 3 DAYS Performed at [facility], [address], [place], [place] [address]    Report Status PENDING  Incomplete  Culture, blood (routine x 2)     Status: None (Preliminary result)   Collection Time: 04/03/23  1:25 PM   Specimen: BLOOD  Result Value Ref Range Status   Specimen Description BLOOD BLOOD RIGHT FOREARM  Final   Special Requests   Final    BOTTLES DRAWN AEROBIC AND ANAEROBIC Blood Culture adequate volume   Culture   Final    NO GROWTH < 24 HOURS Performed at [facility], [address] [place], [place] [address]    Report Status PENDING  Incomplete  Culture, blood (routine x 2)     Status: None (Preliminary result)   Collection Time: 04/03/23  1:25 PM    Specimen: BLOOD  Result Value Ref Range Status   Specimen Description BLOOD RIGHT ANTECUBITAL  Final   Special Requests   Final    BOTTLES DRAWN AEROBIC AND ANAEROBIC Blood Culture adequate volume   Culture   Final    NO GROWTH < 24 HOURS Performed at [facility], [address] [place], [place] [address]    Report Status PENDING  Incomplete  Aerobic/Anaerobic Culture w Gram Stain (surgical/deep wound)     Status: None (Preliminary result)   Collection  Time: 04/03/23  6:02 PM   Specimen: Soft Tissue Abscess  Result Value Ref Range Status   Specimen Description   Final    Abscess S T Performed at [facility], [address], [place], [place] [address]    Special Requests   Final    NONE Performed at [facility], [address] [place], [place] [address]    Gram Stain   Final    FEW WBC PRESENT, PREDOMINANTLY MONONUCLEAR FEW GRAM POSITIVE COCCI IN PAIRS    Culture   Final    NO GROWTH < 12 HOURS Performed at [facility], [address] [place], [place] [address]    Report Status PENDING  Incomplete      Studies: No results found.  Scheduled Meds:  apixaban  5 mg Oral BID   dexamethasone (DECADRON) injection  10 mg Intravenous Q8H    Continuous Infusions:  sodium chloride 50 mL/hr at 04/04/23 0734   ampicillin-sulbactam (UNASYN) IV 3 g (04/04/23 1343)   vancomycin 1,000 mg (04/04/23 1459)     LOS: 1 day     Darlin Drop, MD [facility] Pager [phone]  If 7PM-7AM, please contact night-coverage [email or web address] Password [email or web address] 04/04/2023, 3:00 PM

## 2023-04-05 ENCOUNTER — Inpatient Hospital Stay: Payer: PPO

## 2023-04-05 ENCOUNTER — Inpatient Hospital Stay: Payer: PPO | Admitting: Pharmacist

## 2023-04-05 ENCOUNTER — Inpatient Hospital Stay: Payer: PPO | Admitting: Oncology

## 2023-04-05 ENCOUNTER — Telehealth: Payer: Self-pay | Admitting: *Deleted

## 2023-04-05 DIAGNOSIS — R7881 Bacteremia: Secondary | ICD-10-CM | POA: Diagnosis not present

## 2023-04-05 DIAGNOSIS — C9000 Multiple myeloma not having achieved remission: Secondary | ICD-10-CM | POA: Diagnosis not present

## 2023-04-05 DIAGNOSIS — L0201 Cutaneous abscess of face: Secondary | ICD-10-CM

## 2023-04-05 LAB — AEROBIC/ANAEROBIC CULTURE W GRAM STAIN (SURGICAL/DEEP WOUND)

## 2023-04-05 LAB — CULTURE, BLOOD (ROUTINE X 2)
Culture: NO GROWTH
Culture: NO GROWTH

## 2023-04-05 LAB — CULTURE, BLOOD (SINGLE)

## 2023-04-05 LAB — CREATININE, SERUM
Creatinine, Ser: 0.88 mg/dL (ref 0.61–1.24)
GFR, Estimated: >60 mL/min (ref >60)

## 2023-04-05 MED ORDER — HYDRALAZINE HCL 10 MG PO TABS
10.0000 mg | ORAL_TABLET | Freq: Three times a day (TID) | ORAL | Status: DC
  Administered 2023-04-05 – 2023-04-06 (×4): 10 mg via ORAL
  Filled 2023-04-05 (×4): qty 1

## 2023-04-05 MED ORDER — VANCOMYCIN HCL 1250 MG/250ML IV SOLN
1250.0000 mg | INTRAVENOUS | Status: DC
  Administered 2023-04-05: 1250 mg via INTRAVENOUS
  Filled 2023-04-05 (×2): qty 250

## 2023-04-05 NOTE — Plan of Care (Signed)

## 2023-04-05 NOTE — Progress Notes (Signed)
Prior-To-Admission Oral Chemotherapy for Treatment of Oncologic Disease   Order noted from Dr. Margo Aye to continue prior-to-admission oral chemotherapy regimen of Revlimid.  Procedure Per Pharmacy & Therapeutics Committee Policy: Orders for continuation of home oral chemotherapy for treatment of an oncologic disease will be held unless approved by an oncologist during current admission.    For patients receiving oncology care at [facility], inpatient pharmacist contacts patient's oncologist during regular office hours to review. If earlier review is medically necessary, attending physician consults [facility] on-call oncologist   For patients receiving oncology care outside of [facility], attending physician consults patient's oncologist to review. If this oncologist or their coverage cannot be reached, attending physician consults [facility] on-call oncologist   Oral chemotherapy continuation order has been discontinued following oncologist review, [facility] oncologist Dr. Orlie Dakin notified by inpatient pharmacy, he requested Revlimid be held while patient recovers from abscess and he will resume as outpatient.   Barrie Folk, PharmD 04/05/2023, 7:56 AM

## 2023-04-05 NOTE — Progress Notes (Signed)
[name] called, overnight update provided.

## 2023-04-05 NOTE — Progress Notes (Signed)
Date of Admission:  04/03/2023     ID: Jack [name] is a 89 y.o. male Principal Problem:   Bacteremia Active Problems:   Essential hypertension   GERD   Atrial fibrillation (HCC)   Multiple myeloma (HCC)   Chronic kidney disease, stage 3b (HCC)   Aneurysm of ascending aorta without rupture (HCC)   Submandibular abscess   Jack [name] is a 89 y.o. with a history of kappa chain  myeloma on Revlimid and Xgeva,  multiple vertebral compression fractures, hypertension, CKD, A-fib on Eliquis, anemia presented to his PCP at [facility] clinic on 04/01/2023 with left  jaw pain of 3 months duration and swelling for 1 week.  He had been to his dentist and dental cause was ruled out.  He was referred to the emergency department On 04/01/2023 his BP was 1 5477, temperature was 98.1, pulse 9059, and respiratory rate of 18.  Labs revealed WBC of 5.9, hemoglobin 11.3, platelet 81, BUN 28, creatinine 1.13. He had a CT maxillofacial with contrast and that showed left mandibular dental implants, anchors were unremarkable, a peripherally enhancing fluid collection extends inferiorly and anteriorly from the anterior genu of the mandible measuring 0.7-3 0.5 to 10 mm.  The collection was not extending to the floor of the mouth.  No breach of the mylohyoid was evident.  He was sent home on clindamycin and steroids after the ED physician discussed with ENT.  Cultures were sent before he was discharged. It came back as gram-positive rods and hence patient was recalled to the ED on 04/03/2023 and I am asked to see the patient for the bacteremia. Vitals in the ED on 04/03/2023 was temperature of 98.3, BP 102 55, pulse 71 and respiratory rate 19. WBC was 5.8, Hb 11.8, platelet 96 and creatinine 1.17. Repeat CT scan of the neck and face was done and that showed a fluid collection centered around the mandibular symphysis measuring 3.6 into 2.4 into 2.6 cm with surrounding inflammation suspicious for abscess.  There was no  underlying bony abnormality.  There was also noted heterogenous sclerosis of the C7 vertebral body and T1 superior endplate unchanged from the prior thoracic spine MRI and likely reflecting treated myeloma.  He was seen by ENT Dr. Jenne Campus And had the submental abscess was aspirated and 5 to 6 cc of pus was taken out.  It was sent for culture.  Patient has been on Unasyn and vancomycin.    Subjective: Pt is feeling better No pain  Medications:   apixaban  5 mg Oral BID   dexamethasone (DECADRON) injection  10 mg Intravenous Q8H   feeding supplement  237 mL Oral TID BM   gabapentin  600 mg Oral TID   morphine  30 mg Oral Q8H   polyethylene glycol  17 g Oral BID   senna-docusate  1 tablet Oral BID    Objective: Vital signs in last 24 hours: Patient Vitals for the past 24 hrs:  BP Temp Temp src Pulse Resp SpO2  04/05/23 1146 (!) 155/76 98.1 F (36.7 C) -- (!) 51 18 97 %  04/05/23 0759 (!) 163/76 97.6 F (36.4 C) Oral (!) 53 18 100 %  04/05/23 0451 (!) 142/82 97.7 F (36.5 C) -- (!) 49 20 100 %  04/04/23 2248 (!) 156/77 -- -- (!) 52 -- --  04/04/23 1936 (!) 171/80 98 F (36.7 C) Oral (!) 54 18 100 %  04/04/23 1728 (!) 184/76 97.6 F (36.4 C) Oral (!) 51  18 97 %      PHYSICAL EXAM:  General: Alert, cooperative, no distress, appears stated age.  Head: Normocephalic, without obvious abnormality, atraumatic. Eyes: Conjunctivae clear, anicteric sclerae. Pupils are equal ENT Nares normal. No drainage or sinus tenderness. Lips, mucosa, and tongue normal. No Thrush Neck: submental area- abscess I/d minimal erythema around Back: No CVA tenderness. Lungs: Clear to auscultation bilaterally. No Wheezing or Rhonchi. No rales. Heart: irregular Abdomen: Soft, non-tender,not distended. Bowel sounds normal. No masses Extremities: atraumatic, no cyanosis. No edema. No clubbing Skin: No rashes or lesions. Or bruising Lymph: Cervical, supraclavicular normal. Neurologic: Grossly  non-focal  Lab Results    Latest Ref Rng & Units 04/04/2023    4:33 AM 04/03/2023   11:45 AM 04/01/2023    9:41 AM  CBC  WBC 4.0 - 10.5 K/uL 4.0  5.8  5.9   Hemoglobin 13.0 - 17.0 g/dL 60.4  54.0  98.1   Hematocrit 39.0 - 52.0 % 30.8  36.3  35.0   Platelets 150 - 400 K/uL 77  96  81        Latest Ref Rng & Units 04/05/2023    5:41 AM 04/04/2023    4:33 AM 04/03/2023    1:25 PM  CMP  Glucose 70 - 99 mg/dL  191  478   BUN 8 - 23 mg/dL  27  38   Creatinine 2.95 - 1.24 mg/dL 6.21  3.08  6.57   Sodium 135 - 145 mmol/L  136  139   Potassium 3.5 - 5.1 mmol/L  4.4  4.6   Chloride 98 - 111 mmol/L  109  106   CO2 22 - 32 mmol/L  21  26   Calcium 8.9 - 10.3 mg/dL  7.7  8.2   Total Protein 6.5 - 8.1 g/dL  5.4  6.2   Total Bilirubin 0.3 - 1.2 mg/dL  0.5  0.4   Alkaline Phos 38 - 126 U/L  70  80   AST 15 - 41 U/L  16  18   ALT 0 - 44 U/L  12  13       Microbiology: BC- actinomyces nuevi Studies/Results: No results found.   Assessment/Plan: Submental abscess.  Has been aspirated and sent for culture Gram stain shows gram-positive cocci in pairs which likely could be Streptococcus or Staphylococcus Patient is on Unasyn and vancomycin There is no evidence of osteomyelitis of the mandibular bone But with actinomyces in the blood it raises the concern for a dental source and possible mandibular bone involvement Will need IV  initially for 4 weeks and then we can do PO Await ID of the abscess culture   Gram-positive bacteremia.  actinomyces  ? Kappa chain myeloma On Revlimid   Hypertension   AKI on CKD- improved  Discussed the management with the patient

## 2023-04-05 NOTE — TOC Initial Note (Signed)
Transition of Care [facility]) - Initial/Assessment Note    Patient Details  Name: Jack [name] MRN: [id number] Date of Birth: [birth year]  Transition of Care [facility]) CM/SW Contact:    Chapman Fitch, RN Phone Number: 04/05/2023, 3:56 PM  Clinical Narrative:                  Met with patient and wife Jack [name] at bedside   Admitted for: bacteremia  Admitted from: home with wife PCP: [facility]   Current home health/prior home health/DME: [facility], [facility], [facility], and [facility]  Prior to admission patient was going to outpatient , wife states that per [facility] outpatient they would require clearance from the hospital in order for him to return  MD to order PT eval  Has not been determined if patient will require oral or IV antibiotics at discharge.  Please consult if home infusion indicated   Expected Discharge Plan: Home/Self Care     Patient Goals and CMS Choice            Expected Discharge Plan and Services     Post Acute Care Choice: Durable Medical Equipment Living arrangements for the past 2 months: Single Family Home                                      Prior Living Arrangements/Services Living arrangements for the past 2 months: Single Family Home Lives with:: Spouse              Current home services: DME    Activities of Daily Living Home Assistive Devices/Equipment: None ADL Screening (condition at time of admission) Patient's cognitive ability adequate to safely complete daily activities?: Yes Is the patient deaf or have difficulty hearing?: Yes Does the patient have difficulty seeing, even when wearing glasses/contacts?: No Does the patient have difficulty concentrating, remembering, or making decisions?: No Patient able to express need for assistance with ADLs?: No Does the patient have difficulty dressing or bathing?: No Independently performs ADLs?: Yes (appropriate for developmental age) Does the patient have difficulty walking or climbing stairs?:  No Weakness of Legs: None Weakness of Arms/Hands: None  Permission Sought/Granted                  Emotional Assessment              Admission diagnosis:  Bacteremia [R78.81] Submental abscess [L02.01] Cellulitis, unspecified cellulitis site [L03.90] Patient Active Problem List   Diagnosis Date Noted   Bacteremia 04/03/2023   Submandibular abscess 04/03/2023   Elevated sed rate 03/21/2023   Elevated C-reactive protein (CRP) 03/21/2023   Chronic pain syndrome 03/16/2023   Pharmacologic therapy 03/16/2023   Disorder of skeletal system 03/16/2023   Problems influencing health status 03/16/2023   History of vertebral fracture 03/16/2023   Compression fracture of L3 lumbar vertebra, sequela 03/16/2023   Compression fracture of T11 vertebra, sequela 03/16/2023   Pathologic lumbar vertebral fracture, sequela 03/16/2023   Pathologic fracture of thoracic vertebrae, sequela 03/16/2023   Personal history of multiple myeloma 03/16/2023   Drug tolerance, sequela 03/16/2023   Physical tolerance to opiate drug 03/16/2023   Chronic anticoagulation (Eliquis) 03/16/2023   Opiate use (180 MME/day) 03/16/2023   Aneurysm of ascending aorta without rupture (HCC) 08/26/2022   Statin myopathy 08/26/2022   Postural dizziness with presyncope 06/11/2022   Pelvic fracture (HCC) 06/10/2022   Multiple myeloma (HCC) 05/05/2021   Acute hypoxemic  respiratory failure (HCC) 03/26/2021   Right flank pain 03/26/2021   Multiple pathological fractures 03/14/2021   Kappa light chain myeloma (HCC)    Back pain 03/11/2021   Atrial fibrillation (HCC) 03/11/2021   Compression fracture of L2 lumbar vertebra, sequela 03/11/2021   Hyperlipidemia LDL goal <70    Constipation    Palliative care encounter    Actinic keratosis 02/19/2021   Inguinal hernia 02/19/2021   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   Numbness of foot 02/19/2021   Sciatica 02/19/2021   Shoulder pain  02/19/2021   Palliative care patient 02/17/2021   Compression fracture of L1 lumbar vertebra, sequela 02/07/2021   Protein-calorie malnutrition, severe 01/31/2021   Unsteadiness on feet 01/20/2021   Chronic kidney disease, stage 3b (HCC) 01/07/2021   Collapsed vertebra, not elsewhere classified, thoracic region, subsequent encounter for fracture with routine healing 01/07/2021   Muscle weakness (generalized) 01/07/2021   Prostate hyperplasia, benign localized, without urinary obstruction 01/07/2021   Nonrheumatic mitral (valve) insufficiency 01/07/2021   Hypokalemia    Normocytic anemia    Atherosclerosis of aorta (HCC)    Irregular heart rhythm    Compression fracture of T12 vertebra, sequela 01/02/2021   History of kidney stones    Acute kidney injury superimposed on CKD (HCC)    Ureteral stone    Ureteral perforation secondary to stent manipulation 10/16/2017   Abdominal pain 10/16/2017   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/26/2016   Hyperplasia, prostate 02/03/2015   Multinodular goiter 07/03/2014   Thyroid nodule 06/20/2014   Nontoxic uninodular goiter 06/20/2014   Edema 09/24/2013   Encounter for preventive health examination 08/21/2013   Personal history of colonic polyps 08/21/2013   History of venomous snake bite 08/21/2013   Cough 10/02/2012   Prostate hypertrophy    Elevated prostate specific antigen (PSA)    HYPERTRIGLYCERIDEMIA 12/21/2006   Essential hypertension 12/21/2006   GERD 12/21/2006   CALCULUS, KIDNEY 12/21/2006   PCP:  Dorothey Baseman, MD Pharmacy:   [facility] [phone] [address], [place] - [address] [place] [address] Phone: [phone] Fax: [phone]  [facility] [address] [place] [address] Phone: [phone] Fax: [phone]  [facility], [place] - [address] [facility] [address] [place] [address] Phone: [phone] Fax: [phone]  [id number] - [place], [place] - [address] AT [facility] [address] [facility] [address] [place] [address] Phone: [phone] Fax: [phone]     Social Determinants of Health (SDOH) Social History: SDOH Screenings   Food Insecurity: No Food Insecurity (04/03/2023)  Housing: Patient Declined (04/03/2023)  Transportation Needs: No Transportation Needs (04/03/2023)  Utilities: Not At Risk (04/03/2023)  Depression (PHQ2-9): Low Risk  (03/16/2023)  Financial Resource Strain: Low Risk  (01/03/2023)   Received from [facility], [facility]  Tobacco Use: High Risk (04/03/2023)   SDOH Interventions:     Readmission Risk Interventions    04/05/2023    3:44 PM 03/27/2021    1:02 PM  Readmission Risk Prevention Plan  Transportation Screening Complete Complete  Medication Review (RN Care Manager) Complete Complete  PCP or Specialist appointment within 3-5 days of discharge  Complete  HRI or Home Care Consult  Complete  SW Recovery Care/Counseling Consult Complete Complete  Palliative Care Screening Not Applicable Complete  Skilled Nursing Facility  Patient Refused

## 2023-04-05 NOTE — Consult Note (Signed)
Pharmacy Antibiotic Note  Jack [name] is a 89 y.o. male admitted on 04/03/2023 with bacteremia.  Patient presented to ED on 7/19 and was diagnosed with submandibular abscess and discharged on PO antibiotics.  Patient called to return to ED due to positive blood cultures.  Pharmacy has been consulted for Vancomycin and Unasyn dosing dosing.  Plan: Change vancomycin to 1250 mg IV every 24 hours due to improving renal function Estimated AUC 467.9, Cmin 11.1 IBW, Scr 0.88, Vd 0.72 Vancomycin levels at steady state or as clinically indicated. Continue Unasyn 3 grams IV every 6 hours Follow renal function for adjustments  Height: 5\' 6"  (167.6 cm) Weight: 76.2 kg (168 lb) IBW/kg (Calculated) : 63.8  Temp (24hrs), Avg:97.7 F (36.5 C), Min:97.6 F (36.4 C), Max:98 F (36.7 C)  Recent Labs  Lab 04/01/23 0941 04/03/23 1145 04/03/23 1325 04/04/23 0433 04/05/23 0541  WBC 5.9 5.8  --  4.0  --   CREATININE 1.13  --  1.17 1.00 0.88  LATICACIDVEN  --  1.8 1.8  --   --     Estimated Creatinine Clearance: 53.4 mL/min (by C-G formula based on SCr of 0.88 mg/dL).    Allergies  Allergen Reactions   Quinolones     Aortic dilation contraindicates FQ   Amlodipine Swelling and Other (See Comments)    LE edema   Azithromycin Nausea And Vomiting   Lipitor [Atorvastatin]     Muscle pain in RT Leg    Lisinopril Cough   Zetia [Ezetimibe]     Pain in legs    Hydromorphone Hcl     Wife states "Medication makes extremely sedated and lethargic"    Antimicrobials this admission: Vancomycin 7/21 >>  Unasyn 7/21 >>  Cefepime x 1 7/21 in ED  Dose adjustments this admission: Vancomycin 1000 mg q24h>>1250mg  q24h  Microbiology results: 7/19 BCx: 1/4 (aerobic only) bottles GPR 7/21 BCx: ngtd 7/21 Abscess culture: pending   Thank you for allowing pharmacy to be a part of this patient's care.  Barrie Folk, PharmD 04/05/2023 8:38 AM

## 2023-04-05 NOTE — Telephone Encounter (Signed)
Call from [place] with [facility] asking for Dr Orlie Dakin to say whether patient should continue his Revlimid while in hospital or not. Please advise

## 2023-04-05 NOTE — Plan of Care (Signed)
BP was elevated at the beginning of the shift. Provider notified, PRN hydralazine ordered for SBP >175. PRN hydralazine not given as parameter not met. Pain stable with prn and sch pain medication.    Problem: Education: Goal: Knowledge of General Education information will improve Description: Including pain rating scale, medication(s)/side effects and non-pharmacologic comfort measures Outcome: Progressing   Problem: Health Behavior/Discharge Planning: Goal: Ability to manage health-related needs will improve Outcome: Progressing   Problem: Clinical Measurements: Goal: Ability to maintain clinical measurements within normal limits will improve Outcome: Progressing Goal: Will remain free from infection Outcome: Progressing Goal: Respiratory complications will improve Outcome: Progressing Goal: Cardiovascular complication will be avoided Outcome: Progressing   Problem: Activity: Goal: Risk for activity intolerance will decrease Outcome: Progressing   Problem: Nutrition: Goal: Adequate nutrition will be maintained Outcome: Progressing   Problem: Coping: Goal: Level of anxiety will decrease Outcome: Progressing   Problem: Elimination: Goal: Will not experience complications related to bowel motility Outcome: Progressing

## 2023-04-05 NOTE — Progress Notes (Signed)
PROGRESS NOTE  Jack [name] AVW:[id number] DOB: 1932-11-02 DOA: 04/03/2023 PCP: Jack Baseman, MD  HPI/Recap of past 24 hours: Jack [name] is a 89 y.o. male with medical history significant of multiple myeloma, stage III CKD, GERD, hyperlipidemia, hypertension, atrial fibrillation presenting with bacteremia and submandibular abscess.  History from patient as well as wife at the bedside.  Per report, patient with worsening jaw swelling over the past 1 to 2 months.  Initially saw dentistry still with worsening symptoms.  Noted to be followed by Dr. Orlie [name] with oncology.  Had one of his medications Rivka Barbara held with concern of it being the source of pain.  However, over the past week or so, patient with worsening redness and swelling of the left lower jaw and submandibular area.  Was seen by urgent care and redirected to the ER on July 19.  Had CT done concerning for submandibular abscess.  Per report, ENT evaluated with recommendation for outpatient antibiotics.  Blood cultures also drawn.  Per the wife, patient has been compliant with antibiotics.  Still with moderate redness and swelling.  No trismus or difficulty swallowing.  No shortness of breath.  Was called back to the ER today because of positive blood culture showing gram-positive rods 1/2 cultures.  Presented to the ER afebrile, hemodynamically stable.  White count 5.8, hemoglobin 11.8, platelets 96, lactate 1.8.  Creatinine 1.17.  CT of the soft tissue neck pending.  Started on empiric antibiotics in the ER.  04/04/23:  The patient was seen and examined at his bedside.  S/p aspiration of submental abscess by ENT, growing Gram stain showing gram-positive cocci.  Currently on Unasyn and IV vancomycin as recommended by ID.  04/05/2023: The patient was seen and examined at his bedside.  Submandibular pain is improved.  Beta-blocker was held due to bradycardia.  Hydralazine started due to uncontrolled  hypertension.  Assessment/Plan: Principal Problem:   Bacteremia Active Problems:   Essential hypertension   GERD   Atrial fibrillation (HCC)   Multiple myeloma (HCC)   Chronic kidney disease, stage 3b (HCC)   Aneurysm of ascending aorta without rupture (HCC)   Submandibular abscess  Gram-positive rods bacteremia Blood cultures 1/2 with gram-positive rods in the setting of recent evaluation for submandibular abscess with noted concurrent multiple myeloma on immunosuppressive medications Started on IV cefepime and vancomycin in the ER pending speciation and repeat blood cultures Will transition from cefepime to Zosyn per ID preliminary recommendations Appreciate infectious disease evaluation.   Submandibular abscess status post aspiration on 04/03/2023 by ENT Persistent left lower jaw redness and swelling despite course of oral antibiotics including clindamycin No active trismus or trouble swallowing per patient but with worsening pain CT soft tissue neck pending to better assess On IV zosyn and vancomycin in setting of bacteremia 1/2 blood cultures with gram-positive rods Follow-up imaging ENT consult as clinically indicated Follow  Essential hypertension, BP is not at goal, elevated Hold off home beta-blocker due to bradycardia Start hydralazine 10 mg 3 times daily Closely monitor vital signs.   Aneurysm of ascending aorta without rupture (HCC) 03/2022 CT with a 4.6 cm ascending aortic aneurysm Followed by CT surgery  Pt declining any further intervention    Chronic kidney disease, stage 3b (HCC) Creatinine 1.17 today with GFR greater than 60 Monitor   Multiple myeloma (HCC) Stage II kappa chain myeloma Followed by Dr. Orlie [name] outpatient Continue Revlimid Continue pain regimen Follow    Atrial fibrillation (HCC) Rate controlled at present Continue home Eliquis,  restarted on 04/04/2023.   GERD PPI   Essential hypertension BP stable Titrate home  regimen Continue to monitor vital signs  Thrombocytopenia Platelets 77 No reported overt bleeding at this time. Continue to monitor.   Anemia of chronic disease Hemoglobin 10.1 Continue to monitor  Physical debility Ambulates with a cane PT OT assessment Fall precautions          Advance Care Planning:   Code Status: DNR    Consults: Infectious disease, ENT   Family Communication: None at bedside.      Code Status: DNR.    Procedures: Aspiration by ENT on 04/03/2023   DVT prophylaxis: Home Eliquis  Status is: Inpatient The patient requires at least 2 midnights for further evaluation and treatment of present condition.    Objective: Vitals:   04/04/23 2248 04/05/23 0451 04/05/23 0759 04/05/23 1146  BP: (!) 156/77 (!) 142/82 (!) 163/76 (!) 155/76  Pulse: (!) 52 (!) 49 (!) 53 (!) 51  Resp:  20 18 18   Temp:  97.7 F (36.5 C) 97.6 F (36.4 C) 98.1 F (36.7 C)  TempSrc:   Oral   SpO2:  100% 100% 97%  Weight:      Height:        Intake/Output Summary (Last 24 hours) at 04/05/2023 1619 Last data filed at 04/05/2023 1456 Gross per 24 hour  Intake 1352 ml  Output 1575 ml  Net -223 ml   Filed Weights   04/03/23 1132  Weight: 76.2 kg    Exam:  General: 89 y.o. year-old male well-developed well-nourished in no acute distress.  He is alert oriented x 3.  Hard of hearing.  Wears hearing aids. Cardiovascular: Regular rate and rhythm with no rubs or gallops.  No thyromegaly or JVD noted.   Respiratory: Clear to auscultation with no wheezes or rales. Good inspiratory effort. Abdomen: Soft nontender nondistended with normal bowel sounds x4 quadrants. Musculoskeletal: No lower extremity edema. 2/4 pulses in all 4 extremities. Skin: Submandibular erythema has improved. Psychiatry: Mood is appropriate for condition setting.   Data Reviewed: CBC: Recent Labs  Lab 04/01/23 0941 04/03/23 1145 04/04/23 0433  WBC 5.9 5.8 4.0  NEUTROABS 3.8 3.8  --   HGB  11.3* 11.8* 10.1*  HCT 35.0* 36.3* 30.8*  MCV 100.9* 100.8* 98.4  PLT 81* 96* 77*   Basic Metabolic Panel: Recent Labs  Lab 04/01/23 0941 04/03/23 1325 04/04/23 0433 04/05/23 0541  NA 140 139 136  --   K 4.3 4.6 4.4  --   CL 103 106 109  --   CO2 26 26 21*  --   GLUCOSE 122* 120* 167*  --   BUN 28* 38* 27*  --   CREATININE 1.13 1.17 1.00 0.88  CALCIUM 8.3* 8.2* 7.7*  --    GFR: Estimated Creatinine Clearance: 53.4 mL/min (by C-G formula based on SCr of 0.88 mg/dL). Liver Function Tests: Recent Labs  Lab 04/01/23 0941 04/03/23 1325 04/04/23 0433  AST 17 18 16   ALT 14 13 12   ALKPHOS 88 80 70  BILITOT 1.1 0.4 0.5  PROT 6.1* 6.2* 5.4*  ALBUMIN 3.3* 3.5 2.9*   No results for input(s): "LIPASE", "AMYLASE" in the last 168 hours. No results for input(s): "AMMONIA" in the last 168 hours. Coagulation Profile: No results for input(s): "INR", "PROTIME" in the last 168 hours. Cardiac Enzymes: No results for input(s): "CKTOTAL", "CKMB", "CKMBINDEX", "TROPONINI" in the last 168 hours. BNP (last 3 results) No results for input(s): "PROBNP" in the  last 8760 hours. HbA1C: No results for input(s): "HGBA1C" in the last 72 hours. CBG: No results for input(s): "GLUCAP" in the last 168 hours. Lipid Profile: No results for input(s): "CHOL", "HDL", "LDLCALC", "TRIG", "CHOLHDL", "LDLDIRECT" in the last 72 hours. Thyroid Function Tests: No results for input(s): "TSH", "T4TOTAL", "FREET4", "T3FREE", "THYROIDAB" in the last 72 hours. Anemia Panel: No results for input(s): "VITAMINB12", "FOLATE", "FERRITIN", "TIBC", "IRON", "RETICCTPCT" in the last 72 hours. Urine analysis:    Component Value Date/Time   COLORURINE YELLOW (A) 04/03/2023 1325   APPEARANCEUR HAZY (A) 04/03/2023 1325   APPEARANCEUR Cloudy (A) 06/11/2021 1121   LABSPEC 1.021 04/03/2023 1325   PHURINE 5.0 04/03/2023 1325   GLUCOSEU 50 (A) 04/03/2023 1325   HGBUR NEGATIVE 04/03/2023 1325   BILIRUBINUR NEGATIVE 04/03/2023  1325   BILIRUBINUR Negative 06/11/2021 1121   KETONESUR NEGATIVE 04/03/2023 1325   PROTEINUR 30 (A) 04/03/2023 1325   NITRITE NEGATIVE 04/03/2023 1325   LEUKOCYTESUR NEGATIVE 04/03/2023 1325   Sepsis Labs: @LABRCNTIP (procalcitonin:4,lacticidven:4)  ) Recent Results (from the past 240 hour(s))  Culture, blood (single)     Status: Abnormal   Collection Time: 04/01/23  3:14 PM   Specimen: BLOOD  Result Value Ref Range Status   Specimen Description   Final    BLOOD BLOOD LEFT ARM Performed at [facility], [address], [place], [place] [address]    Special Requests   Final    BOTTLES DRAWN AEROBIC AND ANAEROBIC Blood Culture adequate volume Performed at [facility], [address] [place], [place] [address]    Culture  Setup Time   Final    GRAM POSITIVE RODS IN BOTH AEROBIC AND ANAEROBIC BOTTLES CRITICAL RESULT CALLED TO, READ BACK BY AND VERIFIED WITH:  ASHLEY MURRAY 1034 04/03/2023 CP Performed at [facility], [address] [place], [place] [address]    Culture (A)  Final    ACTINOMYCES NEUII Standardized susceptibility testing for this organism is not available. Performed at [facility], [address], [place], [place] [address]    Report Status 04/05/2023 FINAL  Final  Culture, blood (routine x 2)     Status: None (Preliminary result)   Collection Time: 04/01/23  3:36 PM   Specimen: BLOOD  Result Value Ref Range Status   Specimen Description BLOOD BLOOD LEFT ARM  Final   Special Requests   Final    BOTTLES DRAWN AEROBIC AND ANAEROBIC Blood Culture results may not be optimal due to an inadequate volume of blood received in culture bottles   Culture   Final    NO GROWTH 4 DAYS Performed at [facility], [address] [place], [place] [address]    Report Status PENDING  Incomplete  Culture, blood (routine x 2)     Status: None (Preliminary result)   Collection Time: 04/03/23  1:25 PM   Specimen: BLOOD  Result  Value Ref Range Status   Specimen Description BLOOD BLOOD RIGHT FOREARM  Final   Special Requests   Final    BOTTLES DRAWN AEROBIC AND ANAEROBIC Blood Culture adequate volume   Culture   Final    NO GROWTH 2 DAYS Performed at [facility], [address] [place], [place] [address]    Report Status PENDING  Incomplete  Culture, blood (routine x 2)     Status: None (Preliminary result)   Collection Time: 04/03/23  1:25 PM   Specimen: BLOOD  Result Value Ref Range Status   Specimen Description  BLOOD RIGHT ANTECUBITAL  Final   Special Requests   Final    BOTTLES DRAWN AEROBIC AND ANAEROBIC Blood Culture adequate volume   Culture   Final    NO GROWTH 2 DAYS Performed at [facility], [address], [place], [place] [address]    Report Status PENDING  Incomplete  Aerobic/Anaerobic Culture w Gram Stain (surgical/deep wound)     Status: None (Preliminary result)   Collection Time: 04/03/23  6:02 PM   Specimen: Soft Tissue Abscess  Result Value Ref Range Status   Specimen Description   Final    Abscess S T Performed at [facility], [address] [place], [place] [address]    Special Requests   Final    NONE Performed at [facility], [address] [place], [place] [address]    Gram Stain   Final    FEW WBC PRESENT, PREDOMINANTLY MONONUCLEAR FEW GRAM POSITIVE COCCI IN PAIRS    Culture   Final    NO GROWTH 2 DAYS NO ANAEROBES ISOLATED; CULTURE IN PROGRESS FOR 5 DAYS Performed at [facility], [address], [place], [place] [address]    Report Status PENDING  Incomplete      Studies: No results found.  Scheduled Meds:  apixaban  5 mg Oral BID   dexamethasone (DECADRON) injection  10 mg Intravenous Q8H   feeding supplement  237 mL Oral TID BM   gabapentin  600 mg Oral TID   morphine  30 mg Oral Q8H   polyethylene glycol  17 g Oral BID   senna-docusate  1 tablet Oral BID    Continuous Infusions:  sodium chloride 50  mL/hr at 04/05/23 0636   ampicillin-sulbactam (UNASYN) IV 3 g (04/05/23 1455)   vancomycin       LOS: 2 days     Darlin Drop, MD [facility] Pager [phone]  If 7PM-7AM, please contact night-coverage [email or web address] Password [facility] 04/05/2023, 4:19 PM

## 2023-04-06 ENCOUNTER — Ambulatory Visit: Payer: PPO | Admitting: Physical Therapy

## 2023-04-06 ENCOUNTER — Other Ambulatory Visit: Payer: Self-pay

## 2023-04-06 DIAGNOSIS — C9000 Multiple myeloma not having achieved remission: Secondary | ICD-10-CM | POA: Diagnosis not present

## 2023-04-06 DIAGNOSIS — L0201 Cutaneous abscess of face: Secondary | ICD-10-CM | POA: Diagnosis not present

## 2023-04-06 DIAGNOSIS — R7881 Bacteremia: Secondary | ICD-10-CM | POA: Diagnosis not present

## 2023-04-06 LAB — BASIC METABOLIC PANEL WITH GFR
Anion gap: 4 — ABNORMAL LOW (ref 5–15)
BUN: 35 mg/dL — ABNORMAL HIGH (ref 8–23)
CO2: 18 mmol/L — ABNORMAL LOW (ref 22–32)
Calcium: 7.5 mg/dL — ABNORMAL LOW (ref 8.9–10.3)
Chloride: 113 mmol/L — ABNORMAL HIGH (ref 98–111)
Creatinine, Ser: 0.91 mg/dL (ref 0.61–1.24)
GFR, Estimated: >60 mL/min
Glucose, Bld: 146 mg/dL — ABNORMAL HIGH (ref 70–99)
Potassium: 4 mmol/L (ref 3.5–5.1)
Sodium: 135 mmol/L (ref 135–145)

## 2023-04-06 LAB — CBC
HCT: 30.1 % — ABNORMAL LOW (ref 39.0–52.0)
Hemoglobin: 9.9 g/dL — ABNORMAL LOW (ref 13.0–17.0)
MCH: 31.8 pg (ref 26.0–34.0)
MCHC: 32.9 g/dL (ref 30.0–36.0)
MCV: 96.8 fL (ref 80.0–100.0)
Platelets: 85 K/uL — ABNORMAL LOW (ref 150–400)
RBC: 3.11 MIL/uL — ABNORMAL LOW (ref 4.22–5.81)
RDW: 17.2 % — ABNORMAL HIGH (ref 11.5–15.5)
WBC: 3.8 K/uL — ABNORMAL LOW (ref 4.0–10.5)
nRBC: 0 % (ref 0.0–0.2)

## 2023-04-06 LAB — CULTURE, BLOOD (ROUTINE X 2)

## 2023-04-06 LAB — AEROBIC/ANAEROBIC CULTURE W GRAM STAIN (SURGICAL/DEEP WOUND)

## 2023-04-06 LAB — SEDIMENTATION RATE: Sed Rate: 46 mm/hr — ABNORMAL HIGH (ref 0–20)

## 2023-04-06 LAB — C-REACTIVE PROTEIN: CRP: 2.6 mg/dL — ABNORMAL HIGH (ref <1.0)

## 2023-04-06 LAB — MAGNESIUM: Magnesium: 2.1 mg/dL (ref 1.7–2.4)

## 2023-04-06 MED ORDER — METRONIDAZOLE 500 MG PO TABS: 500.0000 mg | ORAL_TABLET | Freq: Two times a day (BID) | ORAL | 0 refills | Status: AC

## 2023-04-06 MED ORDER — SODIUM CHLORIDE 0.9 % IV SOLN
2.0000 g | Freq: Every day | INTRAVENOUS | Status: DC
  Administered 2023-04-06: 2 g via INTRAVENOUS
  Filled 2023-04-06: qty 20

## 2023-04-06 MED ORDER — CHLORHEXIDINE GLUCONATE CLOTH 2 % EX PADS: 6.0000 | MEDICATED_PAD | Freq: Every day | CUTANEOUS | Status: DC

## 2023-04-06 MED ORDER — CEFTRIAXONE IV (FOR PTA / DISCHARGE USE ONLY): 2.0000 g | INTRAVENOUS | 0 refills | Status: AC

## 2023-04-06 MED ORDER — SODIUM CHLORIDE 0.9% FLUSH: 10.0000 mL | Freq: Two times a day (BID) | INTRAVENOUS | Status: DC

## 2023-04-06 MED ORDER — LIDOCAINE 5 % EX PTCH
1.0000 | MEDICATED_PATCH | CUTANEOUS | Status: DC
  Administered 2023-04-06: 1 via TRANSDERMAL
  Filled 2023-04-06: qty 1

## 2023-04-06 MED ORDER — SODIUM CHLORIDE 0.9% FLUSH: 10.0000 mL | INTRAVENOUS | Status: DC | PRN

## 2023-04-06 MED ORDER — METRONIDAZOLE 500 MG PO TABS
500.0000 mg | ORAL_TABLET | Freq: Two times a day (BID) | ORAL | Status: DC
  Administered 2023-04-06: 500 mg via ORAL
  Filled 2023-04-06: qty 1

## 2023-04-06 NOTE — Progress Notes (Signed)
Peripherally Inserted Central Catheter Placement  The IV Nurse has discussed with the patient and/or persons authorized to consent for the patient, the purpose of this procedure and the potential benefits and risks involved with this procedure.  The benefits include less needle sticks, lab draws from the catheter, and the patient may be discharged home with the catheter. Risks include, but not limited to, infection, bleeding, blood clot (thrombus formation), and puncture of an artery; nerve damage and irregular heartbeat and possibility to perform a PICC exchange if needed/ordered by physician.  Alternatives to this procedure were also discussed.  Bard Power PICC patient education guide, fact sheet on infection prevention and patient information card has been provided to patient /or left at bedside.    PICC Placement Documentation  PICC Single Lumen 04/06/23 Right Brachial 45 cm 0 cm (Active)  Indication for Insertion or Continuance of Line Home intravenous therapies (PICC only) 04/06/23 1554  Exposed Catheter (cm) 0 cm 04/06/23 1554  Site Assessment Clean, Dry, Intact 04/06/23 1554  Line Status Flushed;Saline locked;Blood return noted 04/06/23 1554  Dressing Type Transparent;Securing device 04/06/23 1554  Dressing Status Antimicrobial disc in place;Clean, Dry, Intact 04/06/23 1554  Dressing Intervention New dressing;Other (Comment) 04/06/23 1554  Dressing Change Due 04/13/23 04/06/23 1554   Wife signed consent at bedside    Maximino Greenland 04/06/2023, 3:56 PM

## 2023-04-06 NOTE — Plan of Care (Signed)
  Problem: Education: Goal: Knowledge of General Education information will improve Description: Including pain rating scale, medication(s)/side effects and non-pharmacologic comfort measures 04/06/2023 1807 by Ermalene Searing, RN Outcome: Adequate for Discharge 04/06/2023 1229 by Ermalene Searing, RN Outcome: Progressing   Problem: Health Behavior/Discharge Planning: Goal: Ability to manage health-related needs will improve 04/06/2023 1807 by Ermalene Searing, RN Outcome: Adequate for Discharge 04/06/2023 1229 by Ermalene Searing, RN Outcome: Progressing   Problem: Clinical Measurements: Goal: Ability to maintain clinical measurements within normal limits will improve 04/06/2023 1807 by Ermalene Searing, RN Outcome: Adequate for Discharge 04/06/2023 1229 by Ermalene Searing, RN Outcome: Progressing Goal: Will remain free from infection 04/06/2023 1807 by Ermalene Searing, RN Outcome: Adequate for Discharge 04/06/2023 1229 by Ermalene Searing, RN Outcome: Progressing Goal: Diagnostic test results will improve 04/06/2023 1807 by Ermalene Searing, RN Outcome: Adequate for Discharge 04/06/2023 1229 by Ermalene Searing, RN Outcome: Progressing Goal: Respiratory complications will improve 04/06/2023 1807 by Ermalene Searing, RN Outcome: Adequate for Discharge 04/06/2023 1229 by Ermalene Searing, RN Outcome: Progressing Goal: Cardiovascular complication will be avoided 04/06/2023 1807 by Ermalene Searing, RN Outcome: Adequate for Discharge 04/06/2023 1229 by Ermalene Searing, RN Outcome: Progressing   Problem: Activity: Goal: Risk for activity intolerance will decrease 04/06/2023 1807 by Ermalene Searing, RN Outcome: Adequate for Discharge 04/06/2023 1229 by Ermalene Searing, RN Outcome: Progressing   Problem: Nutrition: Goal: Adequate nutrition will be maintained 04/06/2023 1807 by Ermalene Searing, RN Outcome: Adequate for Discharge 04/06/2023 1229 by Ermalene Searing, RN Outcome: Progressing   Problem: Coping: Goal: Level of anxiety will  decrease 04/06/2023 1807 by Ermalene Searing, RN Outcome: Adequate for Discharge 04/06/2023 1229 by Ermalene Searing, RN Outcome: Progressing   Problem: Elimination: Goal: Will not experience complications related to bowel motility 04/06/2023 1807 by Ermalene Searing, RN Outcome: Adequate for Discharge 04/06/2023 1229 by Ermalene Searing, RN Outcome: Progressing Goal: Will not experience complications related to urinary retention 04/06/2023 1807 by Ermalene Searing, RN Outcome: Adequate for Discharge 04/06/2023 1229 by Ermalene Searing, RN Outcome: Progressing   Problem: Pain Managment: Goal: General experience of comfort will improve 04/06/2023 1807 by Ermalene Searing, RN Outcome: Adequate for Discharge 04/06/2023 1229 by Ermalene Searing, RN Outcome: Progressing   Problem: Safety: Goal: Ability to remain free from injury will improve 04/06/2023 1807 by Ermalene Searing, RN Outcome: Adequate for Discharge 04/06/2023 1229 by Ermalene Searing, RN Outcome: Progressing   Problem: Skin Integrity: Goal: Risk for impaired skin integrity will decrease 04/06/2023 1807 by Ermalene Searing, RN Outcome: Adequate for Discharge 04/06/2023 1229 by Ermalene Searing, RN Outcome: Progressing

## 2023-04-06 NOTE — Treatment Plan (Signed)
Diagnosis: Actinomyces infection Submental abscess Likely involvement of the the mandible Baseline Creatinine     Allergies  Allergen Reactions   Quinolones     Aortic dilation contraindicates FQ   Amlodipine Swelling and Other (See Comments)    LE edema   Azithromycin Nausea And Vomiting   Lipitor [Atorvastatin]     Muscle pain in RT Leg    Lisinopril Cough   Zetia [Ezetimibe]     Pain in legs    Hydromorphone Hcl     Wife states "Medication makes extremely sedated and lethargic"    OPAT Orders Discharge antibiotics: Ceftriaxone 2 grams IV every 24 hrs + flagyl PO 500mg  BID  Duration: 4 weeks ( may extend to 6) End Date: 05/11/23  [facility] Care Per Protocol:  Labs weekly on Monday while on IV antibiotics: _X_ CBC with differential _X_ CMP _X_ CRP _X_ ESR    _X_ Please leave PIC in place until doctor has seen patient or been notified  Fax weekly lab results  promptly to [phone]  Clinic Follow Up Appt:05/05/23 at 10.45 AM with Dr.Jolan Upchurch   Call [phone] with any questions

## 2023-04-06 NOTE — Progress Notes (Signed)
Date of Admission:  04/03/2023     ID: Jack [name] is a 89 y.o. male Principal Problem:   Bacteremia Active Problems:   Essential hypertension   GERD   Atrial fibrillation (HCC)   Multiple myeloma (HCC)   Chronic kidney disease, stage 3b (HCC)   Aneurysm of ascending aorta without rupture (HCC)   Submandibular abscess   Submental abscess   Jack [name] is a 89 y.o. with a history of kappa chain  myeloma on Revlimid and Xgeva,  multiple vertebral compression fractures, hypertension, CKD, A-fib on Eliquis, anemia presented to his PCP at [facility] clinic on 04/01/2023 with left  jaw pain of 3 months duration and swelling for 1 week.  He had been to his dentist and dental cause was ruled out.  He was referred to the emergency department On 04/01/2023 his BP was 1 5477, temperature was 98.1, pulse 9059, and respiratory rate of 18.  Labs revealed WBC of 5.9, hemoglobin 11.3, platelet 81, BUN 28, creatinine 1.13. He had a CT maxillofacial with contrast and that showed left mandibular dental implants, anchors were unremarkable, a peripherally enhancing fluid collection extends inferiorly and anteriorly from the anterior genu of the mandible measuring 0.7-3 0.5 to 10 mm.  The collection was not extending to the floor of the mouth.  No breach of the mylohyoid was evident.  He was sent home on clindamycin and steroids after the ED physician discussed with ENT.  Cultures were sent before he was discharged. It came back as gram-positive rods and hence patient was recalled to the ED on 04/03/2023 and I am asked to see the patient for the bacteremia. Vitals in the ED on 04/03/2023 was temperature of 98.3, BP 102 55, pulse 71 and respiratory rate 19. WBC was 5.8, Hb 11.8, platelet 96 and creatinine 1.17. Repeat CT scan of the neck and face was done and that showed a fluid collection centered around the mandibular symphysis measuring 3.6 into 2.4 into 2.6 cm with surrounding inflammation suspicious for  abscess.  There was no underlying bony abnormality.  There was also noted heterogenous sclerosis of the C7 vertebral body and T1 superior endplate unchanged from the prior thoracic spine MRI and likely reflecting treated myeloma.  He was seen by ENT Dr. Jenne Campus And had the submental abscess was aspirated and 5 to 6 cc of pus was taken out.  It was sent for culture.  Patient has been on Unasyn and vancomycin.    Subjective: Pt is feeling better No pain Swelling better  Medications:   apixaban  5 mg Oral BID   dexamethasone (DECADRON) injection  10 mg Intravenous Q8H   feeding supplement  237 mL Oral TID BM   gabapentin  600 mg Oral TID   hydrALAZINE  10 mg Oral Q8H   lidocaine  1 patch Transdermal Q24H   morphine  30 mg Oral Q8H   polyethylene glycol  17 g Oral BID   senna-docusate  1 tablet Oral BID    Objective: Vital signs in last 24 hours: Patient Vitals for the past 24 hrs:  BP Temp Temp src Pulse Resp SpO2  04/06/23 0821 (!) 176/78 97.6 F (36.4 C) Oral (!) 51 16 99 %  04/06/23 0427 139/69 (!) 97.4 F (36.3 C) Oral (!) 51 20 98 %  04/05/23 2000 (!) 154/68 97.8 F (36.6 C) Oral (!) 51 18 96 %  04/05/23 1700 (!) 142/76 97.9 F (36.6 C) -- (!) 58 18 98 %  PHYSICAL EXAM:  General: Alert, cooperative, no distress, appears stated age.  Head: Normocephalic, without obvious abnormality, atraumatic. Eyes: Conjunctivae clear, anicteric sclerae. Pupils are equal ENT Nares normal. No drainage or sinus tenderness. Lips, mucosa, and tongue normal. No Thrush Neck: submental area- abscess I/d minimal erythema around Back: No CVA tenderness. Lungs: Clear to auscultation bilaterally. No Wheezing or Rhonchi. No rales. Heart: irregular Abdomen: Soft, non-tender,not distended. Bowel sounds normal. No masses Extremities: atraumatic, no cyanosis. No edema. No clubbing Skin: No rashes or lesions. Or bruising Lymph: Cervical, supraclavicular normal. Neurologic: Grossly  non-focal  Lab Results    Latest Ref Rng & Units 04/06/2023    4:14 AM 04/04/2023    4:33 AM 04/03/2023   11:45 AM  CBC  WBC 4.0 - 10.5 K/uL 3.8  4.0  5.8   Hemoglobin 13.0 - 17.0 g/dL 9.9  14.7  82.9   Hematocrit 39.0 - 52.0 % 30.1  30.8  36.3   Platelets 150 - 400 K/uL 85  77  96        Latest Ref Rng & Units 04/06/2023    4:14 AM 04/05/2023    5:41 AM 04/04/2023    4:33 AM  CMP  Glucose 70 - 99 mg/dL 562   130   BUN 8 - 23 mg/dL 35   27   Creatinine 8.65 - 1.24 mg/dL 7.84  6.96  2.95   Sodium 135 - 145 mmol/L 135   136   Potassium 3.5 - 5.1 mmol/L 4.0   4.4   Chloride 98 - 111 mmol/L 113   109   CO2 22 - 32 mmol/L 18   21   Calcium 8.9 - 10.3 mg/dL 7.5   7.7   Total Protein 6.5 - 8.1 g/dL   5.4   Total Bilirubin 0.3 - 1.2 mg/dL   0.5   Alkaline Phos 38 - 126 U/L   70   AST 15 - 41 U/L   16   ALT 0 - 44 U/L   12       Microbiology: BC- actinomyces nuevi Repeat Blood culture-NG Abscess culture pending Studies/Results: No results found.   Assessment/Plan: Submental abscess.  Has been aspirated and sent for culture Gram stain shows gram-positive cocci in pairs  but culture is neg so far Patient is on Unasyn  There is no evidence of osteomyelitis of the mandibular bone on imaging But with actinomyces in the blood it raises the concern for a dental source and possible mandibular bone involvement Will need IV  initially for 4 weeks and then we can do PO  Actinomyces bacteremia. Discussed with wife and patient and they chose to do Ceftriaxone 2 grams once a day instead of continuous Apmicillin infusion Will add flagyl 500mg  Po BID as well Will follow up as OP  ? Kappa chain myeloma On Revlimid- on hold   Hypertension   AKI on CKD- improved  Discussed the management with care team OPAT orders placed

## 2023-04-06 NOTE — Evaluation (Addendum)
Physical Therapy Evaluation Patient Details Name: Jack [name] MRN: [id number] DOB: [birth year] Today's Date: 04/06/2023  History of Present Illness  Pt is a 89y/o male presenting with bacteremia and submandibular abcess. PMH includes multiple mueloma, stage III CKD, GERD, HLD, HTN, A-fib, and aneurism of acending aorta without rupture.  Clinical Impression   Pt presents laying in bed, 5/10 pain in back region. He currently lives with his wife in a single story condo with no stairs to enter. PTA he was independent with all mobility, walks inside home with [facility] intermittently, and walked ~1/3 mi doing laps in his driveway with rollator. His wife currently assists him with medication management due to difficulty seeing.   Pt was able to perform bed mobility and transfers modI, requiring increased time. 5xSTS measure scored 19.77s,  increasing risk for falls compared to age related norms. Pt was able to ambulate ~461ft with RW and supervision for safety. Pt able to ambulate to the bathroom at the end of the session using IV pole and complete toileting independently. Pt would benefit from continued therapy for ongoing assessment of higher level balance, improve activity tolerance and to maximize functional independence.       Assistance Recommended at Discharge PRN  If plan is discharge home, recommend the following:  Can travel by private vehicle  Direct supervision/assist for medications management;Assist for transportation;Assistance with cooking/housework;Direct supervision/assist for financial management        Equipment Recommendations None recommended by PT  Recommendations for Other Services       Functional Status Assessment Patient has not had a recent decline in their functional status     Precautions / Restrictions Precautions Precautions: None Restrictions Weight Bearing Restrictions: No      Mobility  Bed Mobility Overal bed mobility: Independent                   Transfers Overall transfer level: Modified independent                 General transfer comment: ModI increased time, transferred from bed to recliner during session without assistance or AD    Ambulation/Gait Ambulation/Gait assistance: Supervision, Modified independent (Device/Increase time) Gait Distance (Feet): 400 Feet Assistive device: Rolling walker (2 wheels)         General Gait Details: Utilized RW for comfort/ long distances, supervision for safety. Able to ambulate in room without AD modI  Stairs            Wheelchair Mobility     Tilt Bed    Modified Rankin (Stroke Patients Only)       Balance Overall balance assessment: No apparent balance deficits (not formally assessed) Sitting-balance support: Bilateral upper extremity supported, Feet supported, Feet unsupported Sitting balance-Leahy Scale: Normal       Standing balance-Leahy Scale: Good                               Pertinent Vitals/Pain Pain Assessment Pain Assessment: 0-10 Pain Score: 5  Pain Location: back Pain Descriptors / Indicators: Discomfort, Constant Pain Intervention(s): Monitored during session    Home Living Family/patient expects to be discharged to:: Private residence Living Arrangements: Spouse/significant other Available Help at Discharge: Family Type of Home: House Home Access: Level entry       Home Layout: One level Home Equipment: Rollator (4 wheels);Grab bars - tub/shower;Cane - single point      Prior Function Prior Level of Function :  Needs assist       Physical Assist : ADLs (physical)   ADLs (physical): IADLs Mobility Comments: Walks ~1/37mi a day walking laps in driveway with rollator. Does not typically use AD in home but has cane if needed. Being seen by OPPT for balance ADLs Comments: Assist need for medication management due to difficulty seeing     Hand Dominance        Extremity/Trunk Assessment   Upper  Extremity Assessment Upper Extremity Assessment: Overall WFL for tasks assessed (MMT: shoulder abd, elbow flex/ext 5/5)    Lower Extremity Assessment Lower Extremity Assessment: RLE deficits/detail;LLE deficits/detail RLE Deficits / Details: MMT: R hip flex 3+/5, knee flex/ext 4/5 LLE Deficits / Details: MMT: L hip flex 3+/5, knee flex/ext 4/5       Communication   Communication: No difficulties;HOH (difficulty seeing)  Cognition Arousal/Alertness: Awake/alert Behavior During Therapy: WFL for tasks assessed/performed Overall Cognitive Status: Within Functional Limits for tasks assessed                                 General Comments: A&Ox4        General Comments General comments (skin integrity, edema, etc.): 5xSTS: 19.77s    Exercises     Assessment/Plan    PT Assessment Patient needs continued PT services  PT Problem List Decreased activity tolerance;Decreased mobility       PT Treatment Interventions Therapeutic exercise;Gait training;Balance training;Functional mobility training;Stair training;Therapeutic activities;Patient/family education    PT Goals (Current goals can be found in the Care Plan section)  Acute Rehab PT Goals Patient Stated Goal: stay moving PT Goal Formulation: With patient Time For Goal Achievement: 04/20/23 Potential to Achieve Goals: Good    Frequency Min 1X/week     Co-evaluation               AM-PAC PT "6 Clicks" Mobility  Outcome Measure Help needed turning from your back to your side while in a flat bed without using bedrails?: None Help needed moving from lying on your back to sitting on the side of a flat bed without using bedrails?: None Help needed moving to and from a bed to a chair (including a wheelchair)?: None Help needed standing up from a chair using your arms (e.g., wheelchair or bedside chair)?: None Help needed to walk in hospital room?: None Help needed climbing 3-5 steps with a railing? : A  Little 6 Click Score: 23    End of Session Equipment Utilized During Treatment: Gait belt Activity Tolerance: Patient tolerated treatment well Patient left: in chair;with call bell/phone within reach (Chair alarm not set per RN approval)   PT Visit Diagnosis: Difficulty in walking, not elsewhere classified (R26.2)    Time: 0981-1914 PT Time Calculation (min) (ACUTE ONLY): 31 min   Charges:   PT Evaluation $PT Eval Low Complexity: 1 Low PT Treatments $Therapeutic Activity: 23-37 mins PT General Charges $$ ACUTE PT VISIT: 1 Visit         Jazlyn Tippens, PT, SPT 2:22 PM,04/06/23

## 2023-04-06 NOTE — Progress Notes (Signed)
PHARMACY CONSULT NOTE FOR:  OUTPATIENT  PARENTERAL ANTIBIOTIC THERAPY (OPAT)  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 Regimen: Ceftriaxone 2gm IV q24h **also PO metronidazole 500mg  PO BID End date: 05/11/2023  Labs - Once weekly:  CBC/D, CMP, ESR and CRP Fax weekly lab results  promptly to [phone] Please leave PICC in place until doctor has seen patient or been notified  IV antibiotic discharge orders are pended. To discharging provider:  please sign these orders via discharge navigator,  Select New Orders & click on the button choice - Manage This Unsigned Work.     Thank you for allowing pharmacy to be a part of this patient's care.  Juliette Alcide, PharmD, BCPS, BCIDP Work Cell: [phone] 04/06/2023 2:56 PM

## 2023-04-06 NOTE — Care Management Important Message (Signed)
Important Message  Patient Details  Name: Jack [name] MRN: [id number] Date of Birth: Dec 11, 1932   Medicare Important Message Given:  N/A - LOS <3 / Initial given by admissions     Johnell Comings 04/06/2023, 8:32 AM

## 2023-04-06 NOTE — Plan of Care (Signed)

## 2023-04-06 NOTE — TOC Progression Note (Signed)
Transition of Care [facility]) - Progression Note    Patient Details  Name: DRUE HARR MRN: [id number] Date of Birth: 1932-10-30  Transition of Care [facility]) CM/SW Contact  Chapman Fitch, RN Phone Number: 04/06/2023, 3:20 PM  Clinical Narrative:     Notified that patient will require home IV infusion at discharge  Patient and wife in agreement Referral made to [facility] with [employer].  She will provide education at bedside today  Referral made and accepted by Kandee Keen with [facility] for RN and PT  Per MD if PICC line is placed today patient can discharge  Pam and [facility] notified.  Patient would receive todays dose of medication prior to discharge, and start home infusion tomorrow   Expected Discharge Plan: Home/Self Care    Expected Discharge Plan and Services     Post Acute Care Choice: Durable Medical Equipment Living arrangements for the past 2 months: Single Family Home                                       Social Determinants of Health (SDOH) Interventions SDOH Screenings   Food Insecurity: No Food Insecurity (04/03/2023)  Housing: Patient Declined (04/03/2023)  Transportation Needs: No Transportation Needs (04/03/2023)  Utilities: Not At Risk (04/03/2023)  Depression (PHQ2-9): Low Risk  (03/16/2023)  Financial Resource Strain: Low Risk  (01/03/2023)   Received from [facility], [facility]  Tobacco Use: High Risk (04/03/2023)    Readmission Risk Interventions    04/05/2023    3:44 PM 03/27/2021    1:02 PM  Readmission Risk Prevention Plan  Transportation Screening Complete Complete  Medication Review (RN Care Manager) Complete Complete  PCP or Specialist appointment within 3-5 days of discharge  Complete  HRI or Home Care Consult  Complete  SW Recovery Care/Counseling Consult Complete Complete  Palliative Care Screening Not Applicable Complete  Skilled Nursing Facility  Patient Refused

## 2023-04-06 NOTE — Discharge Summary (Signed)
Physician Discharge Summary  GILMORE LIST ZOX:[id number] DOB: 08/18/1933 DOA: 04/03/2023  PCP: Dorothey Baseman, MD  Admit date: 04/03/2023 Discharge date: 04/06/2023  Admitted From: Home Disposition:  Home with home health  Recommendations for Outpatient Follow-up:  Follow up with PCP in 1-2 weeks Follow up ID 4 weeks Follow up ENT 2 weeks  Home Health:Yes  Equipment/Devices:PICC   Discharge Condition:Stable  CODE STATUS:FULL  Diet recommendation: Reg  Brief/Interim Summary:   Jack [name] is a 89 y.o. male with medical history significant of multiple myeloma, stage III CKD, GERD, hyperlipidemia, hypertension, atrial fibrillation presenting with bacteremia and submandibular abscess.  History from patient as well as wife at the bedside.  Per report, patient with worsening jaw swelling over the past 1 to 2 months.  Initially saw dentistry still with worsening symptoms.  Noted to be followed by Dr. Orlie Dakin with oncology.  Had one of his medications Rivka Barbara held with concern of it being the source of pain.  However, over the past week or so, patient with worsening redness and swelling of the left lower jaw and submandibular area.  Was seen by urgent care and redirected to the ER on July 19.  Had CT done concerning for submandibular abscess.  Per report, ENT evaluated with recommendation for outpatient antibiotics.  Blood cultures also drawn.  Per the wife, patient has been compliant with antibiotics.  Still with moderate redness and swelling.  No trismus or difficulty swallowing.  No shortness of breath.  Was called back to the ER today because of positive blood culture showing gram-positive rods 1/2 cultures.  Presented to the ER afebrile, hemodynamically stable.  White count 5.8, hemoglobin 11.8, platelets 96, lactate 1.8.  Creatinine 1.17.  CT of the soft tissue neck pending.  Started on empiric antibiotics in the ER.   04/04/23:  The patient was seen and examined at his bedside.  S/p  aspiration of submental abscess by ENT, growing Gram stain showing gram-positive cocci.  Currently on Unasyn and IV vancomycin as recommended by ID.   04/05/2023: The patient was seen and examined at his bedside.  Submandibular pain is improved.  Beta-blocker was held due to bradycardia.  Hydralazine started due to uncontrolled hypertension.  7/24: PICC line in place after discussion with ID and determination of outpatient antibiotic regimen.  Home health ordered.  Patient discharged in stable condition   Discharge Diagnoses:  Principal Problem:   Bacteremia Active Problems:   Essential hypertension   GERD   Atrial fibrillation (HCC)   Multiple myeloma (HCC)   Chronic kidney disease, stage 3b (HCC)   Aneurysm of ascending aorta without rupture (HCC)   Submandibular abscess   Submental abscess  Gram-positive rods bacteremia Blood cultures with actinomyces.  ID consulted.  At time of discharge would recommend Rocephin x 4 weeks and Flagyl p.o. x 4 weeks.  PICC line placed on day of discharge.  Follow-up outpatient ID before the end of antibiotic course.  Discharge home with home health.  PICC line care teaching done with wife at bedside prior to discharge  Submandibular abscess status post aspiration on 04/03/2023 by ENT Status post percutaneous drainage by ENT during admission.  Stable.  Follow-up outpatient ENT   Discharge Instructions  Discharge Instructions     [facility] pharmacist to adjust dose for Vancomycin, Aminoglycosides and other anti-infective therapies as requested by physician.   Complete by: As directed    [facility] to provide Cath Flo 2mg    Complete by: As  directed    Administer for PICC line occlusion and as ordered by physician for other access device issues.   Anaphylaxis Kit: Provided to treat any anaphylactic reaction to the medication being provided to the patient if First Dose or when requested by physician   Complete by: As directed     Epinephrine 1mg /ml vial / amp: Administer 0.3mg  (0.9ml) subcutaneously once for moderate to severe anaphylaxis, nurse to call physician and pharmacy when reaction occurs and call 911 if needed for immediate care   Diphenhydramine 50mg /ml IV vial: Administer 25-50mg  IV/IM PRN for first dose reaction, rash, itching, mild reaction, nurse to call physician and pharmacy when reaction occurs   Sodium Chloride 0.9% NS IV: Administer if needed for hypovolemic blood pressure drop or as ordered by physician after call to physician with anaphylactic reaction   Change dressing on IV access line weekly and PRN   Complete by: As directed    Diet - low sodium heart healthy   Complete by: As directed    Flush IV access with Sodium Chloride 0.9% and Heparin 10 units/ml or 100 units/ml   Complete by: As directed    Home infusion instructions - [facility]   Complete by: As directed    Instructions: Flush IV access with Sodium Chloride 0.9% and Heparin 10units/ml or 100units/ml   Change dressing on IV access line: Weekly and PRN   Instructions Cath Flo 2mg : Administer for PICC Line occlusion and as ordered by physician for other access device   [facility] pharmacist to adjust dose for: Vancomycin, Aminoglycosides and other anti-infective therapies as requested by physician   Increase activity slowly   Complete by: As directed    Method of administration may be changed at the discretion of home infusion pharmacist based upon assessment of the patient and/or caregiver's ability to self-administer the medication ordered   Complete by: As directed       Allergies as of 04/06/2023       Reactions   Quinolones    Aortic dilation contraindicates FQ   Amlodipine Swelling, Other (See Comments)   LE edema   Azithromycin Nausea And Vomiting   Lipitor [atorvastatin]    Muscle pain in RT Leg    Lisinopril Cough   Zetia [ezetimibe]    Pain in legs    Hydromorphone Hcl    Wife states  "Medication makes extremely sedated and lethargic"        Medication List     STOP taking these medications    albuterol 108 (90 Base) MCG/ACT inhaler Commonly known as: VENTOLIN HFA   chlorhexidine 0.12 % solution Commonly known as: PERIDEX   clindamycin 300 MG capsule Commonly known as: CLEOCIN   methylPREDNISolone 4 MG Tbpk tablet Commonly known as: MEDROL DOSEPAK       TAKE these medications    acetaminophen 500 MG tablet Commonly known as: TYLENOL Take 500-1,000 mg by mouth every 8 (eight) hours as needed for moderate pain.   apixaban 5 MG Tabs tablet Commonly known as: Eliquis Take 1 tablet (5 mg total) by mouth 2 (two) times daily.   carvedilol 12.5 MG tablet Commonly known as: COREG Take 0.5 tablets (6.25 mg total) by mouth 2 (two) times daily.   cefTRIAXone IVPB Commonly known as: ROCEPHIN Inject 2 g into the vein daily. 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 First Dose: Yes Last Day of Therapy:  05/11/2023 Labs - Once weekly:  CBC/D, CMP, ESR and CRP Fax  weekly lab results  promptly to [phone] Please leave PIC in place until doctor has seen patient or been notified Method of administration: IV Push Method of administration may be changed at the discretion of home infusion pharmacist based upon assessment of the patient and/or caregiver's ability to self-administer the medication ordered. Start taking on: April 07, 2023   dexamethasone 4 MG tablet Commonly known as: DECADRON TAKE 5 TABLETS BY MOUTH ONE TIME PER WEEK What changed: See the new instructions.   feeding supplement Liqd Take 237 mLs by mouth 3 (three) times daily between meals. What changed: when to take this   gabapentin 300 MG capsule Commonly known as: NEURONTIN Take 2 capsules (600 mg total) by mouth 3 (three) times daily.   ibuprofen 200 MG tablet Commonly known as: ADVIL Take 200 mg by mouth 2 (two) times daily as needed.    lenalidomide 20 MG capsule Commonly known as: REVLIMID Take 1 capsule (20 mg total) by mouth daily. Take for 21 days, then hold for 7 days. Repeat every 28 days What changed: additional instructions   lidocaine 5 % ointment Commonly known as: XYLOCAINE Apply topically 3 (three) times daily as needed for mild pain or moderate pain. What changed:  how much to take additional instructions   metroNIDAZOLE 500 MG tablet Commonly known as: FLAGYL Take 1 tablet (500 mg total) by mouth every 12 (twelve) hours.   morphine 30 MG 12 hr tablet Commonly known as: MS CONTIN Take 1 tablet (30 mg total) by mouth every 8 (eight) hours.   multivitamin with minerals Tabs tablet Take 1 tablet by mouth daily.   naloxone 4 MG/0.1ML Liqd nasal spray kit Commonly known as: NARCAN SPRAY 1 SPRAY INTO ONE NOSTRIL AS DIRECTED FOR OPIOID OVERDOSE (TURN PERSON ON SIDE AFTER DOSE. IF NO RESPONSE IN 2-3 MINUTES OR PERSON RESPONDS BUT RELAPSES, REPEAT USING A NEW SPRAY DEVICE AND SPRAY INTO THE OTHER NOSTRIL. CALL 911 AFTER USE.) * EMERGENCY USE ONLY *   omeprazole 20 MG capsule Commonly known as: PRILOSEC Take 20 mg by mouth daily as needed (takes when taking ibuprofen).   Oxycodone HCl 10 MG Tabs Take 1 tablet (10 mg total) by mouth every 4 (four) hours as needed (pain).   Oyster Shell Calcium w/D 500-5 MG-MCG Tabs Take 1 tablet by mouth in the morning and at bedtime.   polyethylene glycol 17 g packet Commonly known as: MIRALAX / GLYCOLAX Take 17 g by mouth 2 (two) times daily.   potassium citrate 10 MEQ (1080 MG) SR tablet Commonly known as: UROCIT-K TAKE 1 TABLET (10 MEQ TOTAL) BY MOUTH 3 (THREE) TIMES DAILY WITH MEALS.   PRESERVISION AREDS PO Take 1 capsule by mouth in the morning and at bedtime.   senna-docusate 8.6-50 MG tablet Commonly known as: Senokot-S Take 1 tablet by mouth 2 (two) times daily.   vitamin C 1000 MG tablet Take 1,000 mg by mouth in the morning and at bedtime.                Discharge Care Instructions  (From admission, onward)           Start     Ordered   04/06/23 0000  Change dressing on IV access line weekly and PRN  (Home infusion instructions - [facility] )        04/06/23 1742            Follow-up Information     Dorothey Baseman, MD. Schedule an  appointment as soon as possible for a visit in 1 week(s).   Specialty: Family Medicine Contact information: [address] [place] [address] [phone]         Lynn Ito, MD. Schedule an appointment as soon as possible for a visit in 4 week(s).   Specialty: Infectious Diseases Contact information: [address] [place] [address] [phone]         Jeralyn Ruths, MD Follow up.   Specialty: Oncology Why: Follow up at cancer center as directed by Dr. Karren Burly information: [address]  [place] [address] [phone]                Allergies  Allergen Reactions   Quinolones     Aortic dilation contraindicates FQ   Amlodipine Swelling and Other (See Comments)    LE edema   Azithromycin Nausea And Vomiting   Lipitor [Atorvastatin]     Muscle pain in RT Leg    Lisinopril Cough   Zetia [Ezetimibe]     Pain in legs    Hydromorphone Hcl     Wife states "Medication makes extremely sedated and lethargic"    Consultations: ENT Infectious disease   Procedures/Studies: [name] EKG SITE RITE  Result Date: 04/06/2023 If Site Rite image not attached, placement could not be confirmed due to current cardiac rhythm.  CT Soft Tissue Neck W Contrast  Result Date: 04/03/2023 CLINICAL DATA:  Soft tissue infection suspected, neck, xray done. EXAM: CT NECK WITH CONTRAST TECHNIQUE: Multidetector CT imaging of the neck was performed using the standard protocol following the bolus administration of intravenous contrast. RADIATION DOSE REDUCTION: This exam was performed according to the departmental  dose-optimization program which includes automated exposure control, adjustment of the mA and/or kV according to patient size and/or use of iterative reconstruction technique. CONTRAST:  75mL OMNIPAQUE IOHEXOL 300 MG/ML  SOLN COMPARISON:  Thoracic spine MRI 01/18/2023. FINDINGS: Pharynx and larynx: Normal. No mass or swelling. Salivary glands: No inflammation, mass, or stone. Thyroid: Normal. Lymph nodes: No suspicious cervical lymphadenopathy. Vascular: Atherosclerotic calcifications of the carotid bulbs. Limited intracranial: Unremarkable. Visualized orbits: Unremarkable. Mastoids and visualized paranasal sinuses: Well aerated. Skeleton: Fluid collection centered around the mandibular symphysis, measuring up to 3.6 x 2.4 x 2.6 cm (coronal image 11 series 4, sagittal image 66 series 5) with surrounding inflammation, suspicious for abscess. No underlying bony abnormality. Heterogeneous sclerosis of the C7 vertebral body and T1 superior endplate, unchanged from the prior thoracic spine MRI and likely reflecting treated myeloma. Upper chest: Unremarkable. Other: None. IMPRESSION: 1. Fluid collection centered around the mandibular symphysis, measuring up to 3.6 cm, suspicious for abscess. No underlying bony abnormality. 2. Heterogeneous sclerosis of the C7 vertebral body and T1 superior endplate, unchanged from the prior thoracic spine MRI and likely reflecting treated myeloma. Electronically Signed   By: Orvan Falconer M.D.   On: 04/03/2023 15:19   DG Chest Port 1 View  Result Date: 04/03/2023 CLINICAL DATA:  Sepsis.  History of multiple myeloma. EXAM: PORTABLE CHEST 1 VIEW COMPARISON:  Chest radiographs 11/01/2022 FINDINGS: The cardiomediastinal silhouette is unchanged with normal heart size. Lung volumes are decreased with a small amount of hazy opacity in both mid lungs. Small calcified pleural nodules are again noted on the left. No sizable pleural effusion, overt pulmonary edema, or pneumothorax is  identified. No acute osseous abnormality is seen. IMPRESSION: Low lung volumes with mild hazy opacities in both mid lungs for which early infection is not excluded. Electronically  Signed   By: Sebastian Ache M.D.   On: 04/03/2023 12:15   CT Maxillofacial W Contrast  Result Date: 04/01/2023 CLINICAL DATA:  Sublingual/submandibular abscess. Acute onset of left jaw and neck swelling over the last week. Multiple myeloma. EXAM: CT MAXILLOFACIAL WITH CONTRAST TECHNIQUE: Multidetector CT imaging of the maxillofacial structures was performed with intravenous contrast. Multiplanar CT image reconstructions were also generated. RADIATION DOSE REDUCTION: This exam was performed according to the departmental dose-optimization program which includes automated exposure control, adjustment of the mA and/or kV according to patient size and/or use of iterative reconstruction technique. CONTRAST:  75mL OMNIPAQUE IOHEXOL 300 MG/ML  SOLN COMPARISON:  CT head without contrast 06/10/2022 FINDINGS: Osseous: No acute or focal osseous lesion is present. Left mandibular dental implants are noted. The anchors are unremarkable. Orbits: The globes and orbits are within normal limits. Sinuses: The paranasal sinuses and mastoid air cells are clear. Soft tissues: A peripherally enhancing fluid collection extends inferiorly and anteriorly from the anterior genu of the mandible it measures 0.7 x 3.5 by at least 10.0 mm. The inferior aspect of the collection is not imaged. The collection does not extend into the floor of the mouth. No breech of the mylohyoid is evident. Limited intracranial: Within normal limits. IMPRESSION: 1. Peripherally enhancing fluid collection extending inferiorly and anteriorly from the anterior genu of the mandible it measures 0.7 x 3.5 by at least 10.0 mm, consistent with an abscess. The inferior aspect of the collection is not imaged. 2. The collection does not extend into the floor of the mouth. 3. Left mandibular dental  implants are noted. The anchors are unremarkable. No odontogenic source for the infection is implicated. Electronically Signed   By: Marin Roberts M.D.   On: 04/01/2023 12:18   DG Thoracic Spine W/Swimmers  Result Date: 03/23/2023 CLINICAL DATA:  Chronic back pain, history of kyphoplasty, multiple myeloma EXAM: THORACIC SPINE - 3 VIEWS COMPARISON:  01/18/2023 FINDINGS: Frontal and lateral views of the thoracic spine are obtained. Chronic compression deformities and vertebral augmentation noted at T11, T12, L1, and L2. There are no acute displaced fractures. Bones are diffusely osteopenic. Mild diffuse thoracic spondylosis greatest in the mid to lower thoracic spine unchanged. Paraspinal soft tissues are unremarkable. IMPRESSION: 1. Stable chronic compression deformities and prior vertebral augmentation at the thoracolumbar junction. 2. Stable mild mid to lower thoracic spondylosis. No acute fracture. Electronically Signed   By: Sharlet Salina M.D.   On: 03/23/2023 12:40   DG Lumbar Spine Complete W/Bend  Result Date: 03/23/2023 CLINICAL DATA:  Chronic low back pain for several years, history of kyphoplasty, multiple myeloma EXAM: LUMBAR SPINE - COMPLETE WITH BENDING VIEWS COMPARISON:  05/25/2022 FINDINGS: Frontal, bilateral oblique, lateral neutral, lateral flexion, lateral extension views of the lumbar spine are obtained. There are 5 non-rib-bearing lumbar type vertebral bodies again identified. Chronic compression deformities and previous vertebral augmentations at T11, T12, L1, L2, and L3. The bones are diffusely osteopenic. No acute displaced fractures. Disc spaces are relatively well preserved. Stable facet hypertrophic changes primarily at L4-5 and L5-S1. Sacroiliac joints are unremarkable. No instability during flexion or extension. Diffuse atherosclerosis of the aorta and its branches. IMPRESSION: 1. Stable chronic compression deformities and prior vertebral augmentations from T11 through L3.  2. No acute bony abnormalities. 3. No instability with flexion or extension. Electronically Signed   By: Sharlet Salina M.D.   On: 03/23/2023 12:27      Subjective: Seen and examined on the day of discharge.  Stable  no distress.  Appropriate for discharge home.  Discharge Exam: Vitals:   04/06/23 0427 04/06/23 0821  BP: 139/69 (!) 176/78  Pulse: (!) 51 (!) 51  Resp: 20 16  Temp: (!) 97.4 F (36.3 C) 97.6 F (36.4 C)  SpO2: 98% 99%   Vitals:   04/05/23 1700 04/05/23 2000 04/06/23 0427 04/06/23 0821  BP: (!) 142/76 (!) 154/68 139/69 (!) 176/78  Pulse: (!) 58 (!) 51 (!) 51 (!) 51  Resp: 18 18 20 16   Temp: 97.9 F (36.6 C) 97.8 F (36.6 C) (!) 97.4 F (36.3 C) 97.6 F (36.4 C)  TempSrc:  Oral Oral Oral  SpO2: 98% 96% 98% 99%  Weight:      Height:        General: Pt is alert, awake, not in acute distress Cardiovascular: RRR, S1/S2 +, no rubs, no gallops Respiratory: CTA bilaterally, no wheezing, no rhonchi Abdominal: Soft, NT, ND, bowel sounds + Extremities: no edema, no cyanosis    The results of significant diagnostics from this hospitalization (including imaging, microbiology, ancillary and laboratory) are listed below for reference.     Microbiology: Recent Results (from the past 240 hour(s))  Culture, blood (single)     Status: Abnormal   Collection Time: 04/01/23  3:14 PM   Specimen: BLOOD  Result Value Ref Range Status   Specimen Description   Final    BLOOD BLOOD LEFT ARM Performed at [facility], [address] [place], [place] [address]    Special Requests   Final    BOTTLES DRAWN AEROBIC AND ANAEROBIC Blood Culture adequate volume Performed at [facility], [address], [place], [place] [address]    Culture  Setup Time   Final    GRAM POSITIVE RODS IN BOTH AEROBIC AND ANAEROBIC BOTTLES CRITICAL RESULT CALLED TO, READ BACK BY AND VERIFIED WITH:  ASHLEY MURRAY [id number] 04/03/2023 CP Performed at [facility], [address] [place], [place] [address]    Culture (A)  Final    ACTINOMYCES NEUII Standardized susceptibility testing for this organism is not available. Performed at [facility], [address] [place], [place] [address]    Report Status 04/05/2023 FINAL  Final  Culture, blood (routine x 2)     Status: None   Collection Time: 04/01/23  3:36 PM   Specimen: BLOOD  Result Value Ref Range Status   Specimen Description BLOOD BLOOD LEFT ARM  Final   Special Requests   Final    BOTTLES DRAWN AEROBIC AND ANAEROBIC Blood Culture results may not be optimal due to an inadequate volume of blood received in culture bottles   Culture   Final    NO GROWTH 5 DAYS Performed at [facility], [address], [place], [place] [address]    Report Status 04/06/2023 FINAL  Final  Culture, blood (routine x 2)     Status: None (Preliminary result)   Collection Time: 04/03/23  1:25 PM   Specimen: BLOOD  Result Value Ref Range Status   Specimen Description BLOOD BLOOD RIGHT FOREARM  Final   Special Requests   Final    BOTTLES DRAWN AEROBIC AND ANAEROBIC Blood Culture adequate volume   Culture   Final    NO GROWTH 3 DAYS Performed at [facility], [address] [place], [place] [address]    Report Status PENDING  Incomplete  Culture, blood (routine x 2)     Status: None (Preliminary result)   Collection Time:  04/03/23  1:25 PM   Specimen: BLOOD  Result Value Ref Range Status   Specimen Description BLOOD RIGHT ANTECUBITAL  Final   Special Requests   Final    BOTTLES DRAWN AEROBIC AND ANAEROBIC Blood Culture adequate volume   Culture   Final    NO GROWTH 3 DAYS Performed at [facility], [address], [place], [place] [address]    Report Status PENDING  Incomplete  Aerobic/Anaerobic Culture w Gram Stain (surgical/deep wound)     Status: None (Preliminary result)   Collection Time: 04/03/23  6:02 PM   Specimen: Soft Tissue Abscess  Result Value Ref Range  Status   Specimen Description   Final    Abscess S T Performed at [facility], [address] [place], [place] [address]    Special Requests   Final    NONE Performed at [facility], [address] [place], [place] [address]    Gram Stain   Final    FEW WBC PRESENT, PREDOMINANTLY MONONUCLEAR FEW GRAM POSITIVE COCCI IN PAIRS    Culture   Final    NO GROWTH 3 DAYS NO ANAEROBES ISOLATED; CULTURE IN PROGRESS FOR 5 DAYS Performed at [facility], [address] [place], [place] [address]    Report Status PENDING  Incomplete     Labs: BNP (last 3 results) Recent Labs    06/10/22 1152  BNP 160.7*   Basic Metabolic Panel: Recent Labs  Lab 04/01/23 0941 04/03/23 1325 04/04/23 0433 04/05/23 0541 04/06/23 0414  NA 140 139 136  --  135  K 4.3 4.6 4.4  --  4.0  CL 103 106 109  --  113*  CO2 26 26 21*  --  18*  GLUCOSE 122* 120* 167*  --  146*  BUN 28* 38* 27*  --  35*  CREATININE 1.13 1.17 1.00 0.88 0.91  CALCIUM 8.3* 8.2* 7.7*  --  7.5*  MG  --   --   --   --  2.1   Liver Function Tests: Recent Labs  Lab 04/01/23 0941 04/03/23 1325 04/04/23 0433  AST 17 18 16   ALT 14 13 12   ALKPHOS 88 80 70  BILITOT 1.1 0.4 0.5  PROT 6.1* 6.2* 5.4*  ALBUMIN 3.3* 3.5 2.9*   No results for input(s): "LIPASE", "AMYLASE" in the last 168 hours. No results for input(s): "AMMONIA" in the last 168 hours. CBC: Recent Labs  Lab 04/01/23 0941 04/03/23 1145 04/04/23 0433 04/06/23 0414  WBC 5.9 5.8 4.0 3.8*  NEUTROABS 3.8 3.8  --   --   HGB 11.3* 11.8* 10.1* 9.9*  HCT 35.0* 36.3* 30.8* 30.1*  MCV 100.9* 100.8* 98.4 96.8  PLT 81* 96* 77* 85*   Cardiac Enzymes: No results for input(s): "CKTOTAL", "CKMB", "CKMBINDEX", "TROPONINI" in the last 168 hours. BNP: Invalid input(s): "POCBNP" CBG: No results for input(s): "GLUCAP" in the last 168 hours. D-Dimer No results for input(s): "DDIMER" in the last 72 hours. Hgb A1c No results for input(s): "HGBA1C"  in the last 72 hours. Lipid Profile No results for input(s): "CHOL", "HDL", "LDLCALC", "TRIG", "CHOLHDL", "LDLDIRECT" in the last 72 hours. Thyroid function studies No results for input(s): "TSH", "T4TOTAL", "T3FREE", "THYROIDAB" in the last 72 hours.  Invalid input(s): "FREET3" Anemia work up No results for input(s): "VITAMINB12", "FOLATE", "FERRITIN", "TIBC", "IRON", "RETICCTPCT" in the last 72 hours. Urinalysis    Component Value Date/Time   COLORURINE YELLOW (A) 04/03/2023 1325   APPEARANCEUR HAZY (  A) 04/03/2023 1325   APPEARANCEUR Cloudy (A) 06/11/2021 1121   LABSPEC 1.021 04/03/2023 1325   PHURINE 5.0 04/03/2023 1325   GLUCOSEU 50 (A) 04/03/2023 1325   HGBUR NEGATIVE 04/03/2023 1325   BILIRUBINUR NEGATIVE 04/03/2023 1325   BILIRUBINUR Negative 06/11/2021 1121   KETONESUR NEGATIVE 04/03/2023 1325   PROTEINUR 30 (A) 04/03/2023 1325   NITRITE NEGATIVE 04/03/2023 1325   LEUKOCYTESUR NEGATIVE 04/03/2023 1325   Sepsis Labs Recent Labs  Lab 04/01/23 0941 04/03/23 1145 04/04/23 0433 04/06/23 0414  WBC 5.9 5.8 4.0 3.8*   Microbiology Recent Results (from the past 240 hour(s))  Culture, blood (single)     Status: Abnormal   Collection Time: 04/01/23  3:14 PM   Specimen: BLOOD  Result Value Ref Range Status   Specimen Description   Final    BLOOD BLOOD LEFT ARM Performed at [facility], [address] [place], [place] [address]    Special Requests   Final    BOTTLES DRAWN AEROBIC AND ANAEROBIC Blood Culture adequate volume Performed at [facility], [address] [place], [place] [address]    Culture  Setup Time   Final    GRAM POSITIVE RODS IN BOTH AEROBIC AND ANAEROBIC BOTTLES CRITICAL RESULT CALLED TO, READ BACK BY AND VERIFIED WITH:  ASHLEY MURRAY [id number] 04/03/2023 CP Performed at [facility], [address] [place], [place] [address]    Culture (A)  Final    ACTINOMYCES NEUII Standardized susceptibility testing for this  organism is not available. Performed at [facility], [address], [place], [place] [address]    Report Status 04/05/2023 FINAL  Final  Culture, blood (routine x 2)     Status: None   Collection Time: 04/01/23  3:36 PM   Specimen: BLOOD  Result Value Ref Range Status   Specimen Description BLOOD BLOOD LEFT ARM  Final   Special Requests   Final    BOTTLES DRAWN AEROBIC AND ANAEROBIC Blood Culture results may not be optimal due to an inadequate volume of blood received in culture bottles   Culture   Final    NO GROWTH 5 DAYS Performed at [facility], [address], [place], [place] [address]    Report Status 04/06/2023 FINAL  Final  Culture, blood (routine x 2)     Status: None (Preliminary result)   Collection Time: 04/03/23  1:25 PM   Specimen: BLOOD  Result Value Ref Range Status   Specimen Description BLOOD BLOOD RIGHT FOREARM  Final   Special Requests   Final    BOTTLES DRAWN AEROBIC AND ANAEROBIC Blood Culture adequate volume   Culture   Final    NO GROWTH 3 DAYS Performed at [facility], [address], [place], [place] [address]    Report Status PENDING  Incomplete  Culture, blood (routine x 2)     Status: None (Preliminary result)   Collection Time: 04/03/23  1:25 PM   Specimen: BLOOD  Result Value Ref Range Status   Specimen Description BLOOD RIGHT ANTECUBITAL  Final   Special Requests   Final    BOTTLES DRAWN AEROBIC AND ANAEROBIC Blood Culture adequate volume   Culture   Final    NO GROWTH 3 DAYS Performed at [facility], [address] [place], [place] [address]    Report Status PENDING  Incomplete  Aerobic/Anaerobic Culture w Gram Stain (surgical/deep wound)     Status: None (Preliminary result)   Collection Time: 04/03/23  6:02 PM   Specimen: Soft Tissue Abscess  Result Value Ref Range Status   Specimen Description   Final    Abscess S T Performed at [facility], [address], [place], [place]  [address]    Special Requests   Final    NONE Performed at [facility] [address] [place], [place] [address]    Gram Stain   Final    FEW WBC PRESENT, PREDOMINANTLY MONONUCLEAR FEW GRAM POSITIVE COCCI IN PAIRS    Culture   Final    NO GROWTH 3 DAYS NO ANAEROBES ISOLATED; CULTURE IN PROGRESS FOR 5 DAYS Performed at [facility], [address], [place], [place] [address]    Report Status PENDING  Incomplete     Time coordinating discharge: Over 30 minutes  SIGNED:   Tresa Moore, MD  [facility] 04/06/2023, 5:44 PM Pager   If 7PM-7AM, please contact night-coverage

## 2023-04-07 LAB — AEROBIC/ANAEROBIC CULTURE W GRAM STAIN (SURGICAL/DEEP WOUND)

## 2023-04-08 ENCOUNTER — Encounter: Payer: Self-pay | Admitting: Oncology

## 2023-04-08 DIAGNOSIS — L0201 Cutaneous abscess of face: Secondary | ICD-10-CM | POA: Diagnosis not present

## 2023-04-08 DIAGNOSIS — A429 Actinomycosis, unspecified: Secondary | ICD-10-CM | POA: Diagnosis not present

## 2023-04-08 DIAGNOSIS — N1832 Chronic kidney disease, stage 3b: Secondary | ICD-10-CM | POA: Diagnosis not present

## 2023-04-08 DIAGNOSIS — I129 Hypertensive chronic kidney disease with stage 1 through stage 4 chronic kidney disease, or unspecified chronic kidney disease: Secondary | ICD-10-CM | POA: Diagnosis not present

## 2023-04-08 DIAGNOSIS — Z7961 Long term (current) use of immunomodulator: Secondary | ICD-10-CM | POA: Diagnosis not present

## 2023-04-08 DIAGNOSIS — R7881 Bacteremia: Secondary | ICD-10-CM | POA: Diagnosis not present

## 2023-04-08 DIAGNOSIS — Z452 Encounter for adjustment and management of vascular access device: Secondary | ICD-10-CM | POA: Diagnosis not present

## 2023-04-08 DIAGNOSIS — K122 Cellulitis and abscess of mouth: Secondary | ICD-10-CM | POA: Diagnosis not present

## 2023-04-08 DIAGNOSIS — I7121 Aneurysm of the ascending aorta, without rupture: Secondary | ICD-10-CM | POA: Diagnosis not present

## 2023-04-08 DIAGNOSIS — M47814 Spondylosis without myelopathy or radiculopathy, thoracic region: Secondary | ICD-10-CM | POA: Diagnosis not present

## 2023-04-08 DIAGNOSIS — M438X4 Other specified deforming dorsopathies, thoracic region: Secondary | ICD-10-CM | POA: Diagnosis not present

## 2023-04-08 DIAGNOSIS — K219 Gastro-esophageal reflux disease without esophagitis: Secondary | ICD-10-CM | POA: Diagnosis not present

## 2023-04-08 DIAGNOSIS — Z86010 Personal history of colonic polyps: Secondary | ICD-10-CM | POA: Diagnosis not present

## 2023-04-08 DIAGNOSIS — Z7952 Long term (current) use of systemic steroids: Secondary | ICD-10-CM | POA: Diagnosis not present

## 2023-04-08 DIAGNOSIS — E785 Hyperlipidemia, unspecified: Secondary | ICD-10-CM | POA: Diagnosis not present

## 2023-04-08 DIAGNOSIS — M438X6 Other specified deforming dorsopathies, lumbar region: Secondary | ICD-10-CM | POA: Diagnosis not present

## 2023-04-08 DIAGNOSIS — B9689 Other specified bacterial agents as the cause of diseases classified elsewhere: Secondary | ICD-10-CM | POA: Diagnosis not present

## 2023-04-08 DIAGNOSIS — Z79891 Long term (current) use of opiate analgesic: Secondary | ICD-10-CM | POA: Diagnosis not present

## 2023-04-08 DIAGNOSIS — I480 Paroxysmal atrial fibrillation: Secondary | ICD-10-CM | POA: Diagnosis not present

## 2023-04-08 DIAGNOSIS — Z792 Long term (current) use of antibiotics: Secondary | ICD-10-CM | POA: Diagnosis not present

## 2023-04-08 DIAGNOSIS — Z7901 Long term (current) use of anticoagulants: Secondary | ICD-10-CM | POA: Diagnosis not present

## 2023-04-08 DIAGNOSIS — G8929 Other chronic pain: Secondary | ICD-10-CM | POA: Diagnosis not present

## 2023-04-08 DIAGNOSIS — C9000 Multiple myeloma not having achieved remission: Secondary | ICD-10-CM | POA: Diagnosis not present

## 2023-04-08 DIAGNOSIS — Z87442 Personal history of urinary calculi: Secondary | ICD-10-CM | POA: Diagnosis not present

## 2023-04-08 NOTE — Consult Note (Signed)
[facility] Customer service manager [facility]) Accountable Care Organization (ACO) [facility] Liaison Note  04/08/2023  Jack [name] 1933-08-26 [id number]  Location: [facility] RN Hospital Liaison screened the patient remotely at [facility].  Insurance: [employer]   Jack [name] is a 89 y.o. male who is a Primary Care Patient of Dorothey Baseman, MD. The patient was screened for readmission hospitalization with noted extreme risk score for unplanned readmission risk with 1 IP/2 ED in 6 months.  The patient was assessed for potential [facility]) Care Management service needs for post hospital transition for care coordination. Review of patient's electronic medical record reveals patient was admitted for Submandibular Abscess. Pt discharged with [facility] for IV therapy. Pt note to be covered by [employer] for care management needs in [place].     [facility] Care Management/Population Health does not replace or interfere with any arrangements made by the Inpatient Transition of Care team.   For questions contact:   Elliot Cousin, RN, [facility] Liaison [place]   Population Health Office Hours MTWF  8:00 am-6:00 pm Off on Thursday [phone] mobile [phone] [Office toll free line] Office Hours are M-F 8:30 - 5 pm 24 hour nurse advise line [phone] Concierge  [email or web address]

## 2023-04-11 ENCOUNTER — Encounter: Payer: Self-pay | Admitting: Oncology

## 2023-04-11 ENCOUNTER — Ambulatory Visit: Payer: PPO | Admitting: Physical Therapy

## 2023-04-11 DIAGNOSIS — C9000 Multiple myeloma not having achieved remission: Secondary | ICD-10-CM | POA: Diagnosis not present

## 2023-04-11 DIAGNOSIS — K122 Cellulitis and abscess of mouth: Secondary | ICD-10-CM | POA: Diagnosis not present

## 2023-04-11 DIAGNOSIS — G894 Chronic pain syndrome: Secondary | ICD-10-CM | POA: Diagnosis not present

## 2023-04-11 NOTE — Progress Notes (Unsigned)
PROVIDER NOTE: Information contained herein reflects review and annotations entered in association with encounter. Interpretation of such information and data should be left to medically-trained personnel. Information provided to patient can be located elsewhere in the medical record under "Patient Instructions". Document created using STT-dictation technology, any transcriptional errors that may result from process are unintentional.    Patient: Jack [name]  Service Category: E/M  Provider: Oswaldo Done, MD  DOB: 12/09/1932  DOS: 04/13/2023  Referring Provider: Dorothey Baseman, MD  MRN: [id number]  Specialty: Interventional Pain Management  PCP: Dorothey Baseman, MD  Type: Established Patient  Setting: Ambulatory outpatient    Location: Office  Delivery: Face-to-face     Primary Reason(s) for Visit: Encounter for evaluation before starting new chronic pain management plan of care (Level of risk: moderate) CC: No chief complaint on file.  HPI  Jack [name] is a 89 y.o. year old, male patient, who comes today for a follow-up evaluation to review the test results and decide on a treatment plan. He has HYPERTRIGLYCERIDEMIA; Essential hypertension; GERD; CALCULUS, KIDNEY; Prostate hypertrophy; Elevated prostate specific antigen (PSA); Cough; Encounter for preventive health examination; Personal history of colonic polyps; History of venomous snake bite; Edema; Thyroid nodule; Multinodular goiter; Ureteral perforation secondary to stent manipulation; Abdominal pain; Compression fracture of T12 vertebra, sequela; History of kidney stones; Acute kidney injury superimposed on CKD (HCC); Ureteral stone; Hypokalemia; Normocytic anemia; Atherosclerosis of aorta (HCC); Irregular heart rhythm; Protein-calorie malnutrition, severe; Compression fracture of L1 lumbar vertebra, sequela; Palliative care patient; Actinic keratosis; Hyperplasia, prostate; Inguinal hernia; Nonexudative senile macular degeneration of  retina; Nontoxic uninodular goiter; Numbness of foot; Sciatica; Sensory polyneuropathy; Shoulder pain; Back pain; Hyperlipidemia LDL goal <70; Constipation; Atrial fibrillation (HCC); Palliative care encounter; Compression fracture of L2 lumbar vertebra, sequela; Kappa light chain myeloma (HCC); Multiple pathological fractures; Acute hypoxemic respiratory failure (HCC); Right flank pain; Multiple myeloma (HCC); Pelvic fracture (HCC); Postural dizziness with presyncope; Chronic kidney disease, stage 3b (HCC); Collapsed vertebra, not elsewhere classified, thoracic region, subsequent encounter for fracture with routine healing; Muscle weakness (generalized); Prostate hyperplasia, benign localized, without urinary obstruction; Aneurysm of ascending aorta without rupture (HCC); Statin myopathy; Nonrheumatic mitral (valve) insufficiency; Unsteadiness on feet; Chronic pain syndrome; Pharmacologic therapy; Disorder of skeletal system; Problems influencing health status; History of vertebral fracture; Compression fracture of L3 lumbar vertebra, sequela; Compression fracture of T11 vertebra, sequela; Pathologic lumbar vertebral fracture, sequela; Pathologic fracture of thoracic vertebrae, sequela; Personal history of multiple myeloma; Drug tolerance, sequela; 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 His primarily concern today is the No chief complaint on file.  Pain Assessment: Location:     Radiating:   Onset:   Duration:   Quality:   Severity:  /10 (subjective, self-reported pain score)  Effect on ADL:   Timing:   Modifying factors:   BP:    HR:    [name] comes in today for a follow-up visit after his initial evaluation on 03/16/2023. Today we went over the results of his tests. These were explained in "Layman's terms".  During today's appointment we went over my diagnostic impression, as well as the proposed treatment plan.  ***  Patient presented with interventional treatment options. Jack [name] was informed that I will not be providing medication management. Pharmacotherapy evaluation including recommendations may be offered, if specifically requested.   Controlled Substance Pharmacotherapy Assessment REMS (Risk  Evaluation and Mitigation Strategy)  Opioid Analgesic: oxycodone IR 10 mg tablet, 2 tabs p.o. 3 times daily (# 90) (last filled on 03/11/2023) (90 MME); morphine ER 30 mg tablet, 1 tab p.o. 3 times daily (# 90) (last filled on 02/24/2023) (90 MME) MME/day: 180 mg/day  Pill Count: None expected due to no prior prescriptions written by our practice. No notes on file Pharmacokinetics: Liberation and absorption (onset of action): WNL Distribution (time to peak effect): WNL Metabolism and excretion (duration of action): WNL         Pharmacodynamics: Desired effects: Analgesia: [name] reports >50% benefit. Functional ability: Patient reports that medication allows him to accomplish basic ADLs Clinically meaningful improvement in function (CMIF): Sustained CMIF goals met Perceived effectiveness: Described as relatively effective, allowing for increase in activities of daily living (ADL) Undesirable effects: Side-effects or Adverse reactions: None reported Monitoring: [place] PMP: PDMP reviewed during this encounter. Online review of the past 38-month period previously conducted. Not applicable at this point since we have not taken over the patient's medication management yet. List of other Serum/Urine Drug Screening Test(s):  No results found for: "AMPHSCRSER", "BARBSCRSER", "BENZOSCRSER", "COCAINSCRSER", "COCAINSCRNUR", "PCPSCRSER", "THCSCRSER", "THCU", "CANNABQUANT", "OPIATESCRSER", "OXYSCRSER", "PROPOXSCRSER", "ETH", "CBDTHCR", "D8THCCBX", "D9THCCBX" List of all UDS test(s) done:  No results found for:  "TOXASSSELUR", "SUMMARY" Last UDS on record: No results found for: "TOXASSSELUR", "SUMMARY" UDS interpretation: No unexpected findings.          Medication Assessment Form: Not applicable. No opioids. Treatment compliance: Not applicable Risk Assessment Profile: Aberrant behavior: See initial evaluations. None observed or detected today Comorbid factors increasing risk of overdose: See initial evaluation. No additional risks detected today Opioid risk tool (ORT):     03/16/2023    1:03 PM  Opioid Risk   Alcohol 0  Illegal Drugs 0  Rx Drugs 0  Alcohol 0  Illegal Drugs 0  Rx Drugs 0  Age between 16-45 years  0  History of Preadolescent Sexual Abuse 0  Psychological Disease 0  Depression 0  Opioid Risk Tool Scoring 0  Opioid Risk Interpretation Low Risk    ORT Scoring interpretation table:  Score <3 = Low Risk for SUD  Score between 4-7 = Moderate Risk for SUD  Score >8 = High Risk for Opioid Abuse   Risk of substance use disorder (SUD): Low  Risk Mitigation Strategies:  Patient opioid safety counseling: No controlled substances prescribed. Patient-Prescriber Agreement (PPA): No agreement signed.  Controlled substance notification to other providers: None required. No opioid therapy.  Pharmacologic Plan: Non-opioid analgesic therapy offered. Interventional alternatives discussed.             Laboratory Chemistry Profile   Renal Lab Results  Component Value Date   BUN 35 (H) 04/06/2023   CREATININE 0.91 04/06/2023   LABCREA 96.68 02/03/2021   BCR 12 02/24/2021   GFR 59.90 (L) 09/24/2013   GFRAA 29 (L) 10/16/2017   GFRNONAA >60 04/06/2023   SPECGRAV 1.020 06/11/2021   PHUR 5.5 06/11/2021   PROTEINUR 30 (A) 04/03/2023     Electrolytes Lab Results  Component Value Date   NA 135 04/06/2023   K 4.0 04/06/2023   CL 113 (H) 04/06/2023   CALCIUM 7.5 (L) 04/06/2023   MG 2.1 04/06/2023     Hepatic Lab Results  Component Value Date   AST 16 04/04/2023   ALT 12  04/04/2023   ALBUMIN 2.9 (L) 04/04/2023   ALKPHOS 70 04/04/2023   LIPASE 32 03/26/2021  ID Lab Results  Component Value Date   SARSCOV2NAA POSITIVE (A) 11/01/2022   STAPHAUREUS NEGATIVE 01/30/2021   MRSAPCR NEGATIVE 01/30/2021     Bone Lab Results  Component Value Date   VD25OH 28 (L) 08/23/2013     Endocrine Lab Results  Component Value Date   GLUCOSE 146 (H) 04/06/2023   GLUCOSEU 50 (A) 04/03/2023   TSH 3.840 02/24/2021   FREET4 1.04 07/09/2014     Neuropathy No results found for: "VITAMINB12", "FOLATE", "HGBA1C", "HIV"   CNS No results found for: "COLORCSF", "APPEARCSF", "RBCCOUNTCSF", "WBCCSF", "POLYSCSF", "LYMPHSCSF", "EOSCSF", "PROTEINCSF", "GLUCCSF", "JCVIRUS", "CSFOLI", "IGGCSF", "LABACHR", "ACETBL"   Inflammation (CRP: Acute  ESR: Chronic) Lab Results  Component Value Date   CRP 2.6 (H) 04/06/2023   ESRSEDRATE 46 (H) 04/06/2023   LATICACIDVEN 1.8 04/03/2023     Rheumatology No results found for: "RF", "ANA", "LABURIC", "URICUR", "LYMEIGGIGMAB", "LYMEABIGMQN", "HLAB27"   Coagulation Lab Results  Component Value Date   INR 1.1 04/15/2022   LABPROT 14.4 04/15/2022   APTT 33 03/11/2021   PLT 85 (L) 04/06/2023   DDIMER 3.68 (H) 06/10/2022     Cardiovascular Lab Results  Component Value Date   BNP 160.7 (H) 06/10/2022   CKTOTAL 120 01/24/2013   CKMB 1.8 01/24/2013   HGB 9.9 (L) 04/06/2023   HCT 30.1 (L) 04/06/2023     Screening Lab Results  Component Value Date   SARSCOV2NAA POSITIVE (A) 11/01/2022   STAPHAUREUS NEGATIVE 01/30/2021   MRSAPCR NEGATIVE 01/30/2021     Cancer No results found for: "CEA", "CA125", "LABCA2"   Allergens No results found for: "ALMOND", "APPLE", "ASPARAGUS", "AVOCADO", "BANANA", "BARLEY", "BASIL", "BAYLEAF", "GREENBEAN", "LIMABEAN", "WHITEBEAN", "BEEFIGE", "REDBEET", "BLUEBERRY", "BROCCOLI", "CABBAGE", "MELON", "CARROT", "CASEIN", "CASHEWNUT", "CAULIFLOWER", "CELERY"     Note: Lab results reviewed.  Recent  Diagnostic Imaging Review  Cervical Imaging: Cervical MR wo contrast: No results found for this or any previous visit.  Cervical MR wo contrast: No valid procedures specified. Cervical CT wo contrast: Results for orders placed during the hospital encounter of 06/10/22  CT Cervical Spine Wo Contrast  Narrative CLINICAL DATA:  Provided history: Abnormal olfaction (CN 1). Neck trauma, abnormal mental status or neuro exam. Additional history provided: Fall. Laceration on top of head. Patient on Eliquis. None.  EXAM: CT HEAD WITHOUT CONTRAST  CT CERVICAL SPINE WITHOUT CONTRAST  TECHNIQUE: Multidetector CT imaging of the head and cervical spine was performed following the standard protocol without intravenous contrast. Multiplanar CT image reconstructions of the cervical spine were also generated.  RADIATION DOSE REDUCTION: This exam was performed according to the departmental dose-optimization program which includes automated exposure control, adjustment of the mA and/or kV according to patient size and/or use of iterative reconstruction technique.  COMPARISON:  None Available.  FINDINGS: CT HEAD FINDINGS  Brain:  Mild generalized parenchymal atrophy.  There is no acute intracranial hemorrhage.  No demarcated cortical infarct.  No extra-axial fluid collection.  No evidence of an intracranial mass.  No midline shift.  Vascular: No hyperdense vessel.  Atherosclerotic calcifications.  Skull: No fracture or aggressive osseous lesion.  Sinuses/Orbits: Mucosal thickening, and small mucous retention cysts, within the right maxillary sinus. Minimal mucosal thickening within the left maxillary sinus. Minimal mucosal thickening within the bilateral ethmoid and frontal sinuses.  Other: Left parietal scalp hematoma.  CT CERVICAL SPINE FINDINGS  Alignment: Slight grade 1 anterolisthesis at C4-C5. 3 mm C5-C6 grade 1 anterolisthesis. Slight grade 1 anterolisthesis at  C6-C7.  Skull base and vertebrae: The basion-dental and atlanto-dental  intervals are maintained.No evidence of acute fracture to the cervical spine.  Soft tissues and spinal canal: No prevertebral fluid or swelling. No visible canal hematoma.  Disc levels: Cervical spondylosis with multilevel disc space narrowing, disc bulges/central disc protrusions, uncovertebral hypertrophy and facet arthrosis. No appreciable high-grade spinal canal stenosis. Multilevel bony neural foraminal narrowing. Degenerative changes are also present about the C1-C2 articulation.  Upper chest: No consolidation within the imaged lung apices. No visible pneumothorax.  IMPRESSION: CT head:  1. No evidence of acute intracranial abnormality. 2. Left parietal scalp hematoma. 3. Mild generalized parenchymal atrophy. 4. Mild paranasal sinus disease, as described.  CT cervical spine:  1. No evidence of acute fracture to the cervical spine. 2. Grade 1 anterolisthesis at C4-C5, C5-C6 and C6-C7. 3. Cervical spondylosis, as described.   Electronically Signed By: Jackey Loge D.O. On: 06/10/2022 10:10  Cervical DG Bending/F/E views: No results found for this or any previous visit.   Shoulder Imaging: Shoulder-R MR wo contrast: No results found for this or any previous visit.  Shoulder-L MR wo contrast: No results found for this or any previous visit.  Shoulder-R DG: No results found for this or any previous visit.  Shoulder-L DG: No results found for this or any previous visit.   Thoracic Imaging: Thoracic MR wo contrast: Results for orders placed during the hospital encounter of 05/25/22  MR THORACIC SPINE WO CONTRAST  Narrative CLINICAL DATA:  Mid to low back pain. History of multiple myeloma. Prior kyphoplasty.  EXAM: MRI THORACIC AND LUMBAR SPINE WITHOUT CONTRAST  TECHNIQUE: Multiplanar and multiecho pulse sequences of the thoracic and lumbar spine were obtained without intravenous  contrast.  COMPARISON:  None Available.  FINDINGS: MRI THORACIC SPINE FINDINGS  Alignment:  Physiologic.  Vertebrae: Remote compression fractures of T11 and T12, status post vertebral augmentation. No acute fracture. Unchanged unenhanced appearance of lesions at T8-T9.  Cord:  Normal signal and morphology.  Paraspinal and other soft tissues: Negative.  Disc levels:  No spinal canal stenosis  MRI LUMBAR SPINE FINDINGS  Segmentation:  Standard  Alignment:  Normal  Vertebrae: Remote compression deformities at L1, L2 and L3, status post augmentation. No acute abnormality. No bone marrow abnormality at the other levels.  Conus medullaris and cauda equina: Conus extends to the L1-2 level. Conus and cauda equina appear normal.  Paraspinal and other soft tissues: Negative  Disc levels:  L1-L2: Normal disc space and facet joints. No spinal canal stenosis. No neural foraminal stenosis.  L2-L3: Normal disc space and facet joints. No spinal canal stenosis. No neural foraminal stenosis.  L3-L4: Small disc bulge. No spinal canal stenosis. No neural foraminal stenosis.  L4-L5: Normal disc space and facet joints. No spinal canal stenosis. No neural foraminal stenosis.  L5-S1: Normal disc space and facet joints. No spinal canal stenosis. No neural foraminal stenosis.  Visualized sacrum: Normal.  IMPRESSION: 1. Remote compression fractures of T11, T12, L1, L2 and L3, status post vertebral augmentation. No acute abnormality of the thoracic or lumbar spine. 2. Previously described enhancing lesions at T8 and T9 are poorly characterized without contrast. There unenhanced appearance is unchanged. 3. No spinal canal or neural foraminal stenosis.   Electronically Signed By: Deatra Robinson M.D. On: 05/26/2022 03:32  Thoracic MR wo contrast: No valid procedures specified. Thoracic CT wo contrast: Results for orders placed during the hospital encounter of 01/29/21  CT  Thoracic Spine Wo Contrast  Narrative CLINICAL DATA:  Mid back pain; mid back pain, history of T12  fracture, evaluate for additional trauma. Additional provided: Patient reports back pain from old injury, nausea, symptoms persist in for 3 days.  EXAM: CT THORACIC SPINE WITHOUT CONTRAST  TECHNIQUE: Multidetector CT images of the thoracic were obtained using the standard protocol without intravenous contrast.  COMPARISON:  CT abdomen/pelvis 01/02/2021.  FINDINGS: Alignment: No significant spondylolisthesis. Unchanged mild bony retropulsion at the level of the T12 superior endplate.  Vertebrae: Mild interval progression of height loss at site of a previously demonstrated T12 compression fracture (now 60%). Vertebral body height is otherwise maintained within the thoracic spine. No evidence of fracture elsewhere within the thoracic spine. T9 vertebral body hemangioma. Multilevel ventrolateral osteophytes.  Paraspinal and other soft tissues: Linear atelectasis within the imaged lung bases. Hiatal hernia. Aortoiliac atherosclerosis.  Disc levels:  Multilevel disc space narrowing. Most notably, there is moderate to moderately advanced disc space narrowing at the T7-T8 through T9-T12 levels.  At T11-T12, there is mild bony retropulsion at the level of the T12 superior endplate. Associated disc bulge. Mild relative narrowing of the ventral spinal canal. Mild bilateral neural foraminal narrowing.  No appreciable significant disc herniation or spinal canal stenosis at the remaining thoracic levels. No compressive foraminal narrowing.  IMPRESSION: Mild interval progression of a previously demonstrated T12 compression fracture (now with 60% height loss). Unchanged mild bony retropulsion at the level of the T12 superior endplate. Associated disc bulge at T11-T12. Resultant mild relative narrowing of the spinal canal at this level. Mild bilateral T11-T12 neural  foraminal narrowing.  No other thoracic spine fracture is identified.  No significant disc herniation, spinal canal stenosis or compressive neural foraminal narrowing at the remaining thoracic levels.  Linear atelectasis within the imaged lung bases.  Hiatal hernia.  Aortic Atherosclerosis (ICD10-I70.0).   Electronically Signed By: Jackey Loge DO On: 01/29/2021 16:30  Thoracic DG 4 views: No results found for this or any previous visit.  Thoracic DG w/swimmers view: Results for orders placed during the hospital encounter of 03/16/23  [facility] Thoracic Spine W/Swimmers  Narrative CLINICAL DATA:  Chronic back pain, history of kyphoplasty, multiple myeloma  EXAM: THORACIC SPINE - 3 VIEWS  COMPARISON:  01/18/2023  FINDINGS: Frontal and lateral views of the thoracic spine are obtained. Chronic compression deformities and vertebral augmentation noted at T11, T12, L1, and L2. There are no acute displaced fractures. Bones are diffusely osteopenic. Mild diffuse thoracic spondylosis greatest in the mid to lower thoracic spine unchanged. Paraspinal soft tissues are unremarkable.  IMPRESSION: 1. Stable chronic compression deformities and prior vertebral augmentation at the thoracolumbar junction. 2. Stable mild mid to lower thoracic spondylosis. No acute fracture.   Electronically Signed By: Sharlet Salina M.D. On: 03/23/2023 12:40   Lumbosacral Imaging: Lumbar MR wo contrast: Results for orders placed during the hospital encounter of 05/25/22  MR LUMBAR SPINE WO CONTRAST  Narrative CLINICAL DATA:  Mid to low back pain. History of multiple myeloma. Prior kyphoplasty.  EXAM: MRI THORACIC AND LUMBAR SPINE WITHOUT CONTRAST  TECHNIQUE: Multiplanar and multiecho pulse sequences of the thoracic and lumbar spine were obtained without intravenous contrast.  COMPARISON:  None Available.  FINDINGS: MRI THORACIC SPINE FINDINGS  Alignment:  Physiologic.  Vertebrae:  Remote compression fractures of T11 and T12, status post vertebral augmentation. No acute fracture. Unchanged unenhanced appearance of lesions at T8-T9.  Cord:  Normal signal and morphology.  Paraspinal and other soft tissues: Negative.  Disc levels:  No spinal canal stenosis  MRI LUMBAR SPINE FINDINGS  Segmentation:  Standard  Alignment:  Normal  Vertebrae: Remote compression deformities at L1, L2 and L3, status post augmentation. No acute abnormality. No bone marrow abnormality at the other levels.  Conus medullaris and cauda equina: Conus extends to the L1-2 level. Conus and cauda equina appear normal.  Paraspinal and other soft tissues: Negative  Disc levels:  L1-L2: Normal disc space and facet joints. No spinal canal stenosis. No neural foraminal stenosis.  L2-L3: Normal disc space and facet joints. No spinal canal stenosis. No neural foraminal stenosis.  L3-L4: Small disc bulge. No spinal canal stenosis. No neural foraminal stenosis.  L4-L5: Normal disc space and facet joints. No spinal canal stenosis. No neural foraminal stenosis.  L5-S1: Normal disc space and facet joints. No spinal canal stenosis. No neural foraminal stenosis.  Visualized sacrum: Normal.  IMPRESSION: 1. Remote compression fractures of T11, T12, L1, L2 and L3, status post vertebral augmentation. No acute abnormality of the thoracic or lumbar spine. 2. Previously described enhancing lesions at T8 and T9 are poorly characterized without contrast. There unenhanced appearance is unchanged. 3. No spinal canal or neural foraminal stenosis.   Electronically Signed By: Deatra Robinson M.D. On: 05/26/2022 03:32  Lumbar MR wo contrast: No valid procedures specified. Lumbar CT wo contrast: No results found for this or any previous visit.  Lumbar DG Bending views: Results for orders placed during the hospital encounter of 03/16/23  DG Lumbar Spine Complete W/Bend  Narrative CLINICAL DATA:   Chronic low back pain for several years, history of kyphoplasty, multiple myeloma  EXAM: LUMBAR SPINE - COMPLETE WITH BENDING VIEWS  COMPARISON:  05/25/2022  FINDINGS: Frontal, bilateral oblique, lateral neutral, lateral flexion, lateral extension views of the lumbar spine are obtained. There are 5 non-rib-bearing lumbar type vertebral bodies again identified. Chronic compression deformities and previous vertebral augmentations at T11, T12, L1, L2, and L3. The bones are diffusely osteopenic. No acute displaced fractures. Disc spaces are relatively well preserved. Stable facet hypertrophic changes primarily at L4-5 and L5-S1. Sacroiliac joints are unremarkable.  No instability during flexion or extension. Diffuse atherosclerosis of the aorta and its branches.  IMPRESSION: 1. Stable chronic compression deformities and prior vertebral augmentations from T11 through L3. 2. No acute bony abnormalities. 3. No instability with flexion or extension.   Electronically Signed By: Sharlet Salina M.D. On: 03/23/2023 12:27         Sacroiliac Joint Imaging: Sacroiliac Joint DG: No results found for this or any previous visit.   Hip Imaging: Hip-R MR wo contrast: No results found for this or any previous visit.  Hip-L MR wo contrast: No results found for this or any previous visit.  Hip-R CT wo contrast: No results found for this or any previous visit.  Hip-L CT wo contrast: No results found for this or any previous visit.  Hip-R DG 2-3 views: No results found for this or any previous visit.  Hip-L DG 2-3 views: Results for orders placed during the hospital encounter of 06/10/22  DG Hip Unilat W or Wo Pelvis 2-3 Views Left  Narrative CLINICAL DATA:  Fall, left hip pain  EXAM: DG HIP (WITH OR WITHOUT PELVIS) 2-3V LEFT  COMPARISON:  None Available.  FINDINGS: Vertebroplasty material overlies chronic L4 compression fracture. Hernia repair mesh overlies the left pelvis.  Acute nondisplaced fracture of the lateral aspect of the left superior pubic ramus, potentially extending into the medial wall of the left acetabulum. Acute nondisplaced fracture of the medial aspect of the inferior left pubic ramus extending into the left  pubic symphysis. No hip dislocation. Mild bilateral hip osteoarthritis. No suspicious focal osseous lesions.  IMPRESSION: Acute nondisplaced fracture of the lateral aspect of the left superior pubic ramus, potentially extending into the medial wall of the left acetabulum.  Acute nondisplaced fracture of the medial aspect of the inferior left pubic ramus extending into the left pubic symphysis.   Electronically Signed By: Delbert Phenix M.D. On: 06/10/2022 10:04  Hip-B DG Bilateral: No results found for this or any previous visit.   Knee Imaging: Knee-R MR wo contrast: No results found for this or any previous visit.  Knee-L MR wo contrast: No results found for this or any previous visit.  Knee-R CT wo contrast: No results found for this or any previous visit.  Knee-L CT wo contrast: No results found for this or any previous visit.  Knee-R DG 4 views: No results found for this or any previous visit.  Knee-L DG 4 views: No results found for this or any previous visit.   Ankle Imaging: Ankle-R DG Complete: No results found for this or any previous visit.  Ankle-L DG Complete: No results found for this or any previous visit.   Foot Imaging: Foot-R DG Complete: No results found for this or any previous visit.  Foot-L DG Complete: No results found for this or any previous visit.   Elbow Imaging: Elbow-R DG Complete: No results found for this or any previous visit.  Elbow-L DG Complete: No results found for this or any previous visit.   Wrist Imaging: Wrist-R DG Complete: No results found for this or any previous visit.  Wrist-L DG Complete: No results found for this or any previous visit.   Hand  Imaging: Hand-R DG Complete: No results found for this or any previous visit.  Hand-L DG Complete: No results found for this or any previous visit.   Complexity Note: Imaging results reviewed.                         Meds   Current Outpatient Medications:    acetaminophen (TYLENOL) 500 MG tablet, Take 500-1,000 mg by mouth every 8 (eight) hours as needed for moderate pain., Disp: , Rfl:    apixaban (ELIQUIS) 5 MG TABS tablet, Take 1 tablet (5 mg total) by mouth 2 (two) times daily., Disp: 180 tablet, Rfl: 0   Ascorbic Acid (VITAMIN C) 1000 MG tablet, Take 1,000 mg by mouth in the morning and at bedtime., Disp: , Rfl:    Calcium Carb-Cholecalciferol (OYSTER SHELL CALCIUM W/D) 500-5 MG-MCG TABS, Take 1 tablet by mouth in the morning and at bedtime., Disp: , Rfl:    carvedilol (COREG) 12.5 MG tablet, Take 0.5 tablets (6.25 mg total) by mouth 2 (two) times daily., Disp: 180 tablet, Rfl: 3   cefTRIAXone (ROCEPHIN) IVPB, Inject 2 g into the vein daily. 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 First Dose: Yes Last Day of Therapy:  05/11/2023 Labs - Once weekly:  CBC/D, CMP, ESR and CRP Fax weekly lab results  promptly to [phone] Please leave PIC in place until doctor has seen patient or been notified Method of administration: IV Push Method of administration may be changed at the discretion of home infusion pharmacist based upon assessment of the patient and/or caregiver's ability to self-administer the medication ordered., Disp: 35 Units, Rfl: 0   dexamethasone (DECADRON) 4 MG tablet, TAKE 5 TABLETS BY MOUTH ONE TIME PER WEEK (Patient taking differently: Take 20 mg by  mouth once a week.), Disp: 20 tablet, Rfl: 4   feeding supplement (ENSURE ENLIVE / ENSURE PLUS) LIQD, Take 237 mLs by mouth 3 (three) times daily between meals. (Patient taking differently: Take 237 mLs by mouth daily with lunch.), Disp: 237 mL, Rfl: 12   gabapentin (NEURONTIN) 300 MG  capsule, Take 2 capsules (600 mg total) by mouth 3 (three) times daily., Disp: 180 capsule, Rfl: 2   ibuprofen (ADVIL) 200 MG tablet, Take 200 mg by mouth 2 (two) times daily as needed., Disp: , Rfl:    lenalidomide (REVLIMID) 20 MG capsule, Take 1 capsule (20 mg total) by mouth daily. Take for 21 days, then hold for 7 days. Repeat every 28 days (Patient taking differently: Take 20 mg by mouth daily. Take for 21 days, then hold for 7 days. Repeat every 28 days>>next cycle to start 7/23), Disp: 21 capsule, Rfl: 0   lidocaine (XYLOCAINE) 5 % ointment, Apply topically 3 (three) times daily as needed for mild pain or moderate pain. (Patient taking differently: Apply 1 Application topically 3 (three) times daily as needed for mild pain or moderate pain. Apply to back), Disp: 50 g, Rfl: 2   metroNIDAZOLE (FLAGYL) 500 MG tablet, Take 1 tablet (500 mg total) by mouth every 12 (twelve) hours., Disp: 70 tablet, Rfl: 0   morphine (MS CONTIN) 30 MG 12 hr tablet, Take 1 tablet (30 mg total) by mouth every 8 (eight) hours., Disp: 90 tablet, Rfl: 0   Multiple Vitamin (MULTIVITAMIN WITH MINERALS) TABS tablet, Take 1 tablet by mouth daily., Disp: , Rfl:    Multiple Vitamins-Minerals (PRESERVISION AREDS PO), Take 1 capsule by mouth in the morning and at bedtime., Disp: , Rfl:    naloxone (NARCAN) nasal spray 4 mg/0.1 mL, SPRAY 1 SPRAY INTO ONE NOSTRIL AS DIRECTED FOR OPIOID OVERDOSE (TURN PERSON ON SIDE AFTER DOSE. IF NO RESPONSE IN 2-3 MINUTES OR PERSON RESPONDS BUT RELAPSES, REPEAT USING A NEW SPRAY DEVICE AND SPRAY INTO THE OTHER NOSTRIL. CALL 911 AFTER USE.) * EMERGENCY USE ONLY *, Disp: 1 each, Rfl: 0   omeprazole (PRILOSEC) 20 MG capsule, Take 20 mg by mouth daily as needed (takes when taking ibuprofen)., Disp: , Rfl:    Oxycodone HCl 10 MG TABS, Take 1 tablet (10 mg total) by mouth every 4 (four) hours as needed (pain)., Disp: 90 tablet, Rfl: 0   polyethylene glycol (MIRALAX / GLYCOLAX) 17 g packet, Take 17 g by  mouth 2 (two) times daily., Disp: , Rfl: 0   potassium citrate (UROCIT-K) 10 MEQ (1080 MG) SR tablet, TAKE 1 TABLET (10 MEQ TOTAL) BY MOUTH 3 (THREE) TIMES DAILY WITH MEALS., Disp: 270 tablet, Rfl: 2   senna-docusate (SENOKOT-S) 8.6-50 MG tablet, Take 1 tablet by mouth 2 (two) times daily., Disp: 30 tablet, Rfl: 0  ROS  Constitutional: Denies any fever or chills Gastrointestinal: No reported hemesis, hematochezia, vomiting, or acute GI distress Musculoskeletal: Denies any acute onset joint swelling, redness, loss of ROM, or weakness Neurological: No reported episodes of acute onset apraxia, aphasia, dysarthria, agnosia, amnesia, paralysis, loss of coordination, or loss of consciousness  Allergies  Mr. Veech is allergic to quinolones, amlodipine, azithromycin, lipitor [atorvastatin], lisinopril, zetia [ezetimibe], and hydromorphone hcl.  PFSH  Drug: Mr. Schacher  reports no history of drug use. Alcohol:  reports that he does not currently use alcohol after a past usage of about 1.0 standard drink of alcohol per week. Tobacco:  reports that he quit smoking about 57 years ago. His  smoking use included cigarettes. He uses smokeless tobacco. Medical:  has a past medical history of Ascending aortic aneurysm (HCC), CKD (chronic kidney disease), stage III (HCC), Compression fracture of body of thoracic vertebra (HCC), Diastolic dysfunction, Elevated prostate specific antigen (PSA), GERD (gastroesophageal reflux disease), History of colon polyps (2008), History of kidney stones, Hyperlipidemia, Hypertension, Myeloma (HCC), PAF (paroxysmal atrial fibrillation) (HCC), Pain, and Prostate hypertrophy. Surgical: Mr. Taub  has a past surgical history that includes Resection soft tissue tumor leg / ankle radical (jan 2009); Small intestine surgery (1946); Cystoscopy w/ ureteral stent placement (Right, 10/16/2017); Kidney stone surgery; Colon surgery; Tonsillectomy; Cataract extraction w/PHACO (Left, 01/10/2018);  Cataract extraction w/PHACO (Right, 01/25/2018); Extracorporeal shock wave lithotripsy (Right, 12/11/2020); Cystoscopy/ureteroscopy/holmium laser/stent placement (Right, 12/16/2020); IR KYPHO LUMBAR INC FX REDUCE BONE BX UNI/BIL CANNULATION INC/IMAGING (02/02/2021); IR KYPHO EA ADDL LEVEL THORACIC OR LUMBAR (02/02/2021); Kyphoplasty (N/A, 03/12/2021); IR KYPHO THORACIC WITH BONE BIOPSY (04/15/2022); and IR Bone Tumor(s)RF Ablation (04/16/2022). Family: family history includes Cancer in his sister; Hyperlipidemia in his father; Hypertension in his father.  Constitutional Exam  General appearance: Well nourished, well developed, and well hydrated. In no apparent acute distress There were no vitals filed for this visit. BMI Assessment: Estimated body mass index is 27.12 kg/m as calculated from the following:   Height as of 04/03/23: 5\' 6"  (1.676 m).   Weight as of 04/03/23: 168 lb (76.2 kg).  BMI interpretation table: BMI level Category Range association with higher incidence of chronic pain  <18 kg/m2 Underweight   18.5-24.9 kg/m2 Ideal body weight   25-29.9 kg/m2 Overweight Increased incidence by 20%  30-34.9 kg/m2 Obese (Class I) Increased incidence by 68%  35-39.9 kg/m2 Severe obesity (Class II) Increased incidence by 136%  >40 kg/m2 Extreme obesity (Class III) Increased incidence by 254%   Patient's current BMI Ideal Body weight  There is no height or weight on file to calculate BMI. Ideal body weight: 63.8 kg (140 lb 10.5 oz) Adjusted ideal body weight: 68.8 kg (151 lb 9.5 oz)   BMI Readings from Last 4 Encounters:  04/03/23 27.12 kg/m  04/01/23 26.31 kg/m  03/16/23 26.15 kg/m  03/07/23 25.61 kg/m   Wt Readings from Last 4 Encounters:  04/03/23 168 lb (76.2 kg)  04/01/23 168 lb (76.2 kg)  03/16/23 162 lb (73.5 kg)  03/07/23 163 lb 8 oz (74.2 kg)    Psych/Mental status: Alert, oriented x 3 (person, place, & time)       Eyes: PERLA Respiratory: No evidence of acute respiratory  distress  Assessment & Plan  Primary Diagnosis & Pertinent Problem List: {There were no encounter diagnoses. (Refresh or delete this SmartLink)}  Visit Diagnosis: No diagnosis found. Problems updated and reviewed during this visit: No problems updated.  Plan of Care  Pharmacotherapy (Medications Ordered): No orders of the defined types were placed in this encounter.  Procedure Orders    No procedure(s) ordered today   Lab Orders  No laboratory test(s) ordered today   Imaging Orders  No imaging studies ordered today   Referral Orders  No referral(s) requested today    Pharmacological management:  Opioid Analgesics: I will not be prescribing any opioids at this time Membrane stabilizer: I will not be prescribing any at this time Muscle relaxant: I will not be prescribing any at this time NSAID: I will not be prescribing any at this time Other analgesic(s): I will not be prescribing any at this time      Interventional Therapies  Risk Factors  Considerations  Comorbidities ELIQUIS Anticoagulation (Stop: 3 days  Restart: 6 hours)  multiple myeloma  polypharmacy  High Dose opioid Therapy     Planned  Pending:      Under consideration:   Pending   Completed:   None at this time   Therapeutic  Palliative (PRN) options:   None established   Completed by other providers:   None reported         Provider-requested follow-up: No follow-ups on file. Recent Visits Date Type Provider Dept  03/16/23 Office Visit Delano Metz, MD [facility]  Showing recent visits within past 90 days and meeting all other requirements Future Appointments Date Type Provider Dept  04/13/23 Appointment Delano Metz, MD [facility]  Showing future appointments within next 90 days and meeting all other requirements   Primary Care Physician: Dorothey Baseman, MD  Duration of encounter: *** minutes.  Total time on encounter, as per AMA  guidelines included both the face-to-face and non-face-to-face time personally spent by the physician and/or other qualified health care professional(s) on the day of the encounter (includes time in activities that require the physician or other qualified health care professional and does not include time in activities normally performed by clinical staff). Physician's time may include the following activities when performed: Preparing to see the patient (e.g., pre-charting review of records, searching for previously ordered imaging, lab work, and nerve conduction tests) Review of prior analgesic pharmacotherapies. Reviewing PMP Interpreting ordered tests (e.g., lab work, imaging, nerve conduction tests) Performing post-procedure evaluations, including interpretation of diagnostic procedures Obtaining and/or reviewing separately obtained history Performing a medically appropriate examination and/or evaluation Counseling and educating the patient/family/caregiver Ordering medications, tests, or procedures Referring and communicating with other health care professionals (when not separately reported) Documenting clinical information in the electronic or other health record Independently interpreting results (not separately reported) and communicating results to the patient/ family/caregiver Care coordination (not separately reported)  Note by: Oswaldo Done, MD (TTS technology used. I apologize for any typographical errors that were not detected and corrected.) Date: 04/13/2023; Time: 8:05 AM

## 2023-04-12 DIAGNOSIS — K122 Cellulitis and abscess of mouth: Secondary | ICD-10-CM | POA: Diagnosis not present

## 2023-04-13 ENCOUNTER — Ambulatory Visit: Payer: PPO | Attending: Pain Medicine | Admitting: Pain Medicine

## 2023-04-13 ENCOUNTER — Encounter: Payer: Self-pay | Admitting: Pain Medicine

## 2023-04-13 ENCOUNTER — Ambulatory Visit: Payer: PPO | Admitting: Physical Therapy

## 2023-04-13 ENCOUNTER — Telehealth: Payer: Self-pay

## 2023-04-13 VITALS — BP 133/68 | HR 57 | Temp 97.2°F | Resp 16 | Ht 66.0 in | Wt 163.0 lb

## 2023-04-13 DIAGNOSIS — A429 Actinomycosis, unspecified: Secondary | ICD-10-CM | POA: Diagnosis not present

## 2023-04-13 DIAGNOSIS — R7982 Elevated C-reactive protein (CRP): Secondary | ICD-10-CM

## 2023-04-13 DIAGNOSIS — M549 Dorsalgia, unspecified: Secondary | ICD-10-CM

## 2023-04-13 DIAGNOSIS — G8929 Other chronic pain: Secondary | ICD-10-CM

## 2023-04-13 DIAGNOSIS — M8440XS Pathological fracture, unspecified site, sequela: Secondary | ICD-10-CM | POA: Diagnosis not present

## 2023-04-13 DIAGNOSIS — Z79891 Long term (current) use of opiate analgesic: Secondary | ICD-10-CM | POA: Diagnosis not present

## 2023-04-13 DIAGNOSIS — Z7901 Long term (current) use of anticoagulants: Secondary | ICD-10-CM | POA: Diagnosis not present

## 2023-04-13 DIAGNOSIS — G893 Neoplasm related pain (acute) (chronic): Secondary | ICD-10-CM | POA: Insufficient documentation

## 2023-04-13 DIAGNOSIS — L0201 Cutaneous abscess of face: Secondary | ICD-10-CM | POA: Diagnosis not present

## 2023-04-13 DIAGNOSIS — Z8579 Personal history of other malignant neoplasms of lymphoid, hematopoietic and related tissues: Secondary | ICD-10-CM | POA: Diagnosis not present

## 2023-04-13 DIAGNOSIS — S32030S Wedge compression fracture of third lumbar vertebra, sequela: Secondary | ICD-10-CM

## 2023-04-13 DIAGNOSIS — S22080S Wedge compression fracture of T11-T12 vertebra, sequela: Secondary | ICD-10-CM | POA: Diagnosis not present

## 2023-04-13 DIAGNOSIS — S32000S Wedge compression fracture of unspecified lumbar vertebra, sequela: Secondary | ICD-10-CM

## 2023-04-13 DIAGNOSIS — M545 Low back pain, unspecified: Secondary | ICD-10-CM | POA: Insufficient documentation

## 2023-04-13 DIAGNOSIS — S32020S Wedge compression fracture of second lumbar vertebra, sequela: Secondary | ICD-10-CM

## 2023-04-13 DIAGNOSIS — C9000 Multiple myeloma not having achieved remission: Secondary | ICD-10-CM | POA: Diagnosis not present

## 2023-04-13 DIAGNOSIS — I129 Hypertensive chronic kidney disease with stage 1 through stage 4 chronic kidney disease, or unspecified chronic kidney disease: Secondary | ICD-10-CM | POA: Diagnosis not present

## 2023-04-13 DIAGNOSIS — G894 Chronic pain syndrome: Secondary | ICD-10-CM

## 2023-04-13 DIAGNOSIS — M8448XS Pathological fracture, other site, sequela: Secondary | ICD-10-CM

## 2023-04-13 DIAGNOSIS — Z79899 Other long term (current) drug therapy: Secondary | ICD-10-CM

## 2023-04-13 DIAGNOSIS — B9689 Other specified bacterial agents as the cause of diseases classified elsewhere: Secondary | ICD-10-CM | POA: Diagnosis not present

## 2023-04-13 DIAGNOSIS — S22000S Wedge compression fracture of unspecified thoracic vertebra, sequela: Secondary | ICD-10-CM | POA: Diagnosis not present

## 2023-04-13 DIAGNOSIS — M4312 Spondylolisthesis, cervical region: Secondary | ICD-10-CM | POA: Insufficient documentation

## 2023-04-13 DIAGNOSIS — Z9889 Other specified postprocedural states: Secondary | ICD-10-CM | POA: Diagnosis not present

## 2023-04-13 DIAGNOSIS — S32010S Wedge compression fracture of first lumbar vertebra, sequela: Secondary | ICD-10-CM | POA: Diagnosis not present

## 2023-04-13 DIAGNOSIS — K122 Cellulitis and abscess of mouth: Secondary | ICD-10-CM | POA: Diagnosis not present

## 2023-04-13 DIAGNOSIS — R7881 Bacteremia: Secondary | ICD-10-CM | POA: Diagnosis not present

## 2023-04-13 DIAGNOSIS — R700 Elevated erythrocyte sedimentation rate: Secondary | ICD-10-CM | POA: Diagnosis not present

## 2023-04-13 DIAGNOSIS — M546 Pain in thoracic spine: Secondary | ICD-10-CM

## 2023-04-13 DIAGNOSIS — F1190 Opioid use, unspecified, uncomplicated: Secondary | ICD-10-CM

## 2023-04-13 DIAGNOSIS — T50905S Adverse effect of unspecified drugs, medicaments and biological substances, sequela: Secondary | ICD-10-CM | POA: Diagnosis not present

## 2023-04-13 NOTE — Telephone Encounter (Signed)
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  Thank  you.

## 2023-04-13 NOTE — Patient Instructions (Signed)
____________________________________________________________________________________________  Blood Thinners  IMPORTANT NOTICE:  If you take any of these, make sure to notify the nursing staff.  Failure to do so may result in injury.  Recommended time intervals to stop and restart blood-thinners, before & after invasive procedures  Generic Name Brand Name Pre-procedure. Stop this long before procedure. Post-procedure. Minimum waiting period before restarting.  Abciximab Reopro 15 days 2 hrs  Alteplase Activase 10 days 10 days  Anagrelide Agrylin    Apixaban Eliquis 3 days 6 hrs  Cilostazol Pletal 3 days 5 hrs  Clopidogrel Plavix 7-10 days 2 hrs  Dabigatran Pradaxa 5 days 6 hrs  Dalteparin Fragmin 24 hours 4 hrs  Dipyridamole Aggrenox 11days 2 hrs  Edoxaban Lixiana; Savaysa 3 days 2 hrs  Enoxaparin  Lovenox 24 hours 4 hrs  Eptifibatide Integrillin 8 hours 2 hrs  Fondaparinux  Arixtra 72 hours 12 hrs  Hydroxychloroquine Plaquenil 11 days   Prasugrel Effient 7-10 days 6 hrs  Reteplase Retavase 10 days 10 days  Rivaroxaban Xarelto 3 days 6 hrs  Ticagrelor Brilinta 5-7 days 6 hrs  Ticlopidine Ticlid 10-14 days 2 hrs  Tinzaparin Innohep 24 hours 4 hrs  Tirofiban Aggrastat 8 hours 2 hrs  Warfarin Coumadin 5 days 2 hrs   Other medications with blood-thinning effects  Product indications Generic (Brand) names Note  Cholesterol Lipitor Stop 4 days before procedure  Blood thinner (injectable) Heparin (LMW or LMWH Heparin) Stop 24 hours before procedure  Cancer Ibrutinib (Imbruvica) Stop 7 days before procedure  Malaria/Rheumatoid Hydroxychloroquine (Plaquenil) Stop 11 days before procedure  Thrombolytics  10 days before or after procedures   Over-the-counter (OTC) Products with blood-thinning effects  Product Common names Stop Time  Aspirin > 325 mg Goody Powders, Excedrin, etc. 11 days  Aspirin ? 81 mg  7 days  Fish oil  4 days  Garlic supplements  7 days  Ginkgo biloba  36  hours  Ginseng  24 hours  NSAIDs Ibuprofen, Naprosyn, etc. 3 days  Vitamin E  4 days   ____________________________________________________________________________________________    ______________________________________________________________________  Procedure instructions  Do not eat or drink fluids (other than water) for 6 hours before your procedure  No water for 2 hours before your procedure  Take your blood pressure medicine with a sip of water  Arrive 30 minutes before your appointment  Carefully read the "Preparing for your procedure" detailed instructions  If you have questions call us at [phone]  _____________________________________________________________________    ______________________________________________________________________  Preparing for your procedure  Appointments: If you think you may not be able to keep your appointment, call 24-48 hours in advance to cancel. We need time to make it available to others.  During your procedure appointment there will be: No Prescription Refills. No disability issues to discussed. No medication changes or discussions.  Instructions: Food intake: Avoid eating anything solid for at least 8 hours prior to your procedure. Clear liquid intake: You may take clear liquids such as water up to 2 hours prior to your procedure. (No carbonated drinks. No soda.) Transportation: Unless otherwise stated by your physician, bring a driver. Morning Medicines: Except for blood thinners, take all of your other morning medications with a sip of water. Make sure to take your heart and blood pressure medicines. If your blood pressure's lower number is above 100, the case will be rescheduled. Blood thinners: Make sure to stop your blood thinners as instructed.  If you take a blood thinner, but were not instructed to stop it,   call our office [phone] and ask to talk to a nurse. Not stopping a blood thinner prior to certain  procedures could lead to serious complications. Diabetics on insulin: Notify the staff so that you can be scheduled 1st case in the morning. If your diabetes requires high dose insulin, take only  of your normal insulin dose the morning of the procedure and notify the staff that you have done so. Preventing infections: Shower with an antibacterial soap the morning of your procedure.  Build-up your immune system: Take 1000 mg of Vitamin C with every meal (3 times a day) the day prior to your procedure. Antibiotics: Inform the nursing staff if you are taking any antibiotics or if you have any conditions that may require antibiotics prior to procedures. (Example: recent joint implants)   Pregnancy: If you are pregnant make sure to notify the nursing staff. Not doing so may result in injury to the fetus, including death.  Sickness: If you have a cold, fever, or any active infections, call and cancel or reschedule your procedure. Receiving steroids while having an infection may result in complications. Arrival: You must be in the facility at least 30 minutes prior to your scheduled procedure. Tardiness: Your scheduled time is also the cutoff time. If you do not arrive at least 15 minutes prior to your procedure, you will be rescheduled.  Children: Do not bring any children with you. Make arrangements to keep them home. Dress appropriately: There is always a possibility that your clothing may get soiled. Avoid long dresses. Valuables: Do not bring any jewelry or valuables.  Reasons to call and reschedule or cancel your procedure: (Following these recommendations will minimize the risk of a serious complication.) Surgeries: Avoid having procedures within 2 weeks of any surgery. (Avoid for 2 weeks before or after any surgery). Flu Shots: Avoid having procedures within 2 weeks of a flu shots or . (Avoid for 2 weeks before or after immunizations). Barium: Avoid having a procedure within 7-10 days after having  had a radiological study involving the use of radiological contrast. (Myelograms, Barium swallow or enema study). Heart attacks: Avoid any elective procedures or surgeries for the initial 6 months after a "Myocardial Infarction" (Heart Attack). Blood thinners: It is imperative that you stop these medications before procedures. Let us know if you if you take any blood thinner.  Infection: Avoid procedures during or within two weeks of an infection (including chest colds or gastrointestinal problems). Symptoms associated with infections include: Localized redness, fever, chills, night sweats or profuse sweating, burning sensation when voiding, cough, congestion, stuffiness, runny nose, sore throat, diarrhea, nausea, vomiting, cold or Flu symptoms, recent or current infections. It is specially important if the infection is over the area that we intend to treat. Heart and lung problems: Symptoms that may suggest an active cardiopulmonary problem include: cough, chest pain, breathing difficulties or shortness of breath, dizziness, ankle swelling, uncontrolled high or unusually low blood pressure, and/or palpitations. If you are experiencing any of these symptoms, cancel your procedure and contact your primary care physician for an evaluation.  Remember:  Regular Business hours are:  Monday to Thursday 8:00 AM to 4:00 PM  Provider's Schedule: Francisco Naveira, MD:  Procedure days: Tuesday and Thursday 7:30 AM to 4:00 PM  Bilal Lateef, MD:  Procedure days: Monday and Wednesday 7:30 AM to 4:00 PM  ______________________________________________________________________    ____________________________________________________________________________________________  General Risks and Possible Complications  Patient Responsibilities: It is important that you read this as it   is part of your informed consent. It is our duty to inform you of the risks and possible complications associated with treatments  offered to you. It is your responsibility as a patient to read this and to ask questions about anything that is not clear or that you believe was not covered in this document.  Patient's Rights: You have the right to refuse treatment. You also have the right to change your mind, even after initially having agreed to have the treatment done. However, under this last option, if you wait until the last second to change your mind, you may be charged for the materials used up to that point.  Introduction: Medicine is not an exact science. Everything in Medicine, including the lack of treatment(s), carries the potential for danger, harm, or loss (which is by definition: Risk). In Medicine, a complication is a secondary problem, condition, or disease that can aggravate an already existing one. All treatments carry the risk of possible complications. The fact that a side effects or complications occurs, does not imply that the treatment was conducted incorrectly. It must be clearly understood that these can happen even when everything is done following the highest safety standards.  No treatment: You can choose not to proceed with the proposed treatment alternative. The "PRO(s)" would include: avoiding the risk of complications associated with the therapy. The "CON(s)" would include: not getting any of the treatment benefits. These benefits fall under one of three categories: diagnostic; therapeutic; and/or palliative. Diagnostic benefits include: getting information which can ultimately lead to improvement of the disease or symptom(s). Therapeutic benefits are those associated with the successful treatment of the disease. Finally, palliative benefits are those related to the decrease of the primary symptoms, without necessarily curing the condition (example: decreasing the pain from a flare-up of a chronic condition, such as incurable terminal cancer).  General Risks and Complications: These are associated to most  interventional treatments. They can occur alone, or in combination. They fall under one of the following six (6) categories: no benefit or worsening of symptoms; bleeding; infection; nerve damage; allergic reactions; and/or death. No benefits or worsening of symptoms: In Medicine there are no guarantees, only probabilities. No healthcare provider can ever guarantee that a medical treatment will work, they can only state the probability that it may. Furthermore, there is always the possibility that the condition may worsen, either directly, or indirectly, as a consequence of the treatment. Bleeding: This is more common if the patient is taking a blood thinner, either prescription or over the counter (example: Goody Powders, Fish oil, Aspirin, Garlic, etc.), or if suffering a condition associated with impaired coagulation (example: Hemophilia, cirrhosis of the liver, low platelet counts, etc.). However, even if you do not have one on these, it can still happen. If you have any of these conditions, or take one of these drugs, make sure to notify your treating physician. Infection: This is more common in patients with a compromised immune system, either due to disease (example: diabetes, cancer, human immunodeficiency virus [HIV], etc.), or due to medications or treatments (example: therapies used to treat cancer and rheumatological diseases). However, even if you do not have one on these, it can still happen. If you have any of these conditions, or take one of these drugs, make sure to notify your treating physician. Nerve Damage: This is more common when the treatment is an invasive one, but it can also happen with the use of medications, such as those used in the treatment   of cancer. The damage can occur to small secondary nerves, or to large primary ones, such as those in the spinal cord and brain. This damage may be temporary or permanent and it may lead to impairments that can range from temporary numbness to  permanent paralysis and/or brain death. Allergic Reactions: Any time a substance or material comes in contact with our body, there is the possibility of an allergic reaction. These can range from a mild skin rash (contact dermatitis) to a severe systemic reaction (anaphylactic reaction), which can result in death. Death: In general, any medical intervention can result in death, most of the time due to an unforeseen complication. ____________________________________________________________________________________________    

## 2023-04-16 DIAGNOSIS — K122 Cellulitis and abscess of mouth: Secondary | ICD-10-CM | POA: Diagnosis not present

## 2023-04-16 IMAGING — DX DG ABDOMEN 1V
2 series · 2 of 2 positions shown · non-contrast
Comparison: December 25, 2020

CLINICAL DATA: Nausea and vomiting.

EXAM:
ABDOMEN - 1 VIEW

[abdomen kub (1 of 2)]
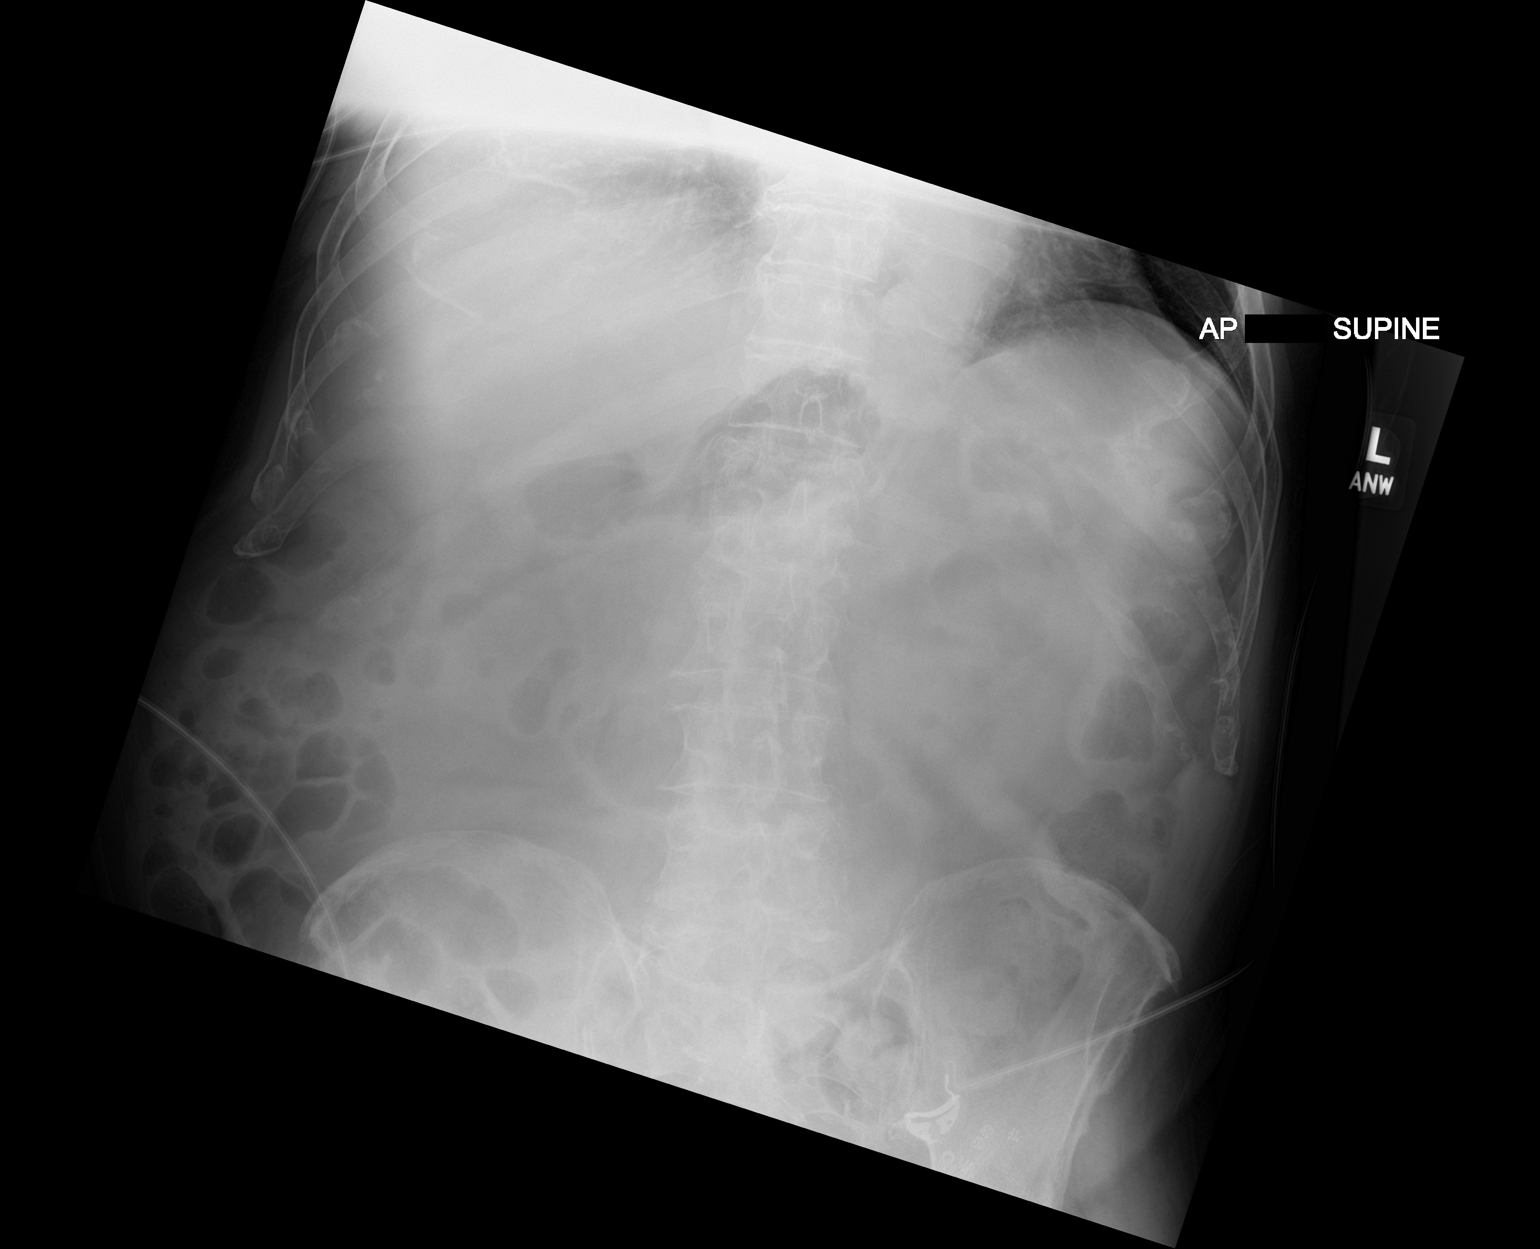

[abdomen kub (2 of 2)]
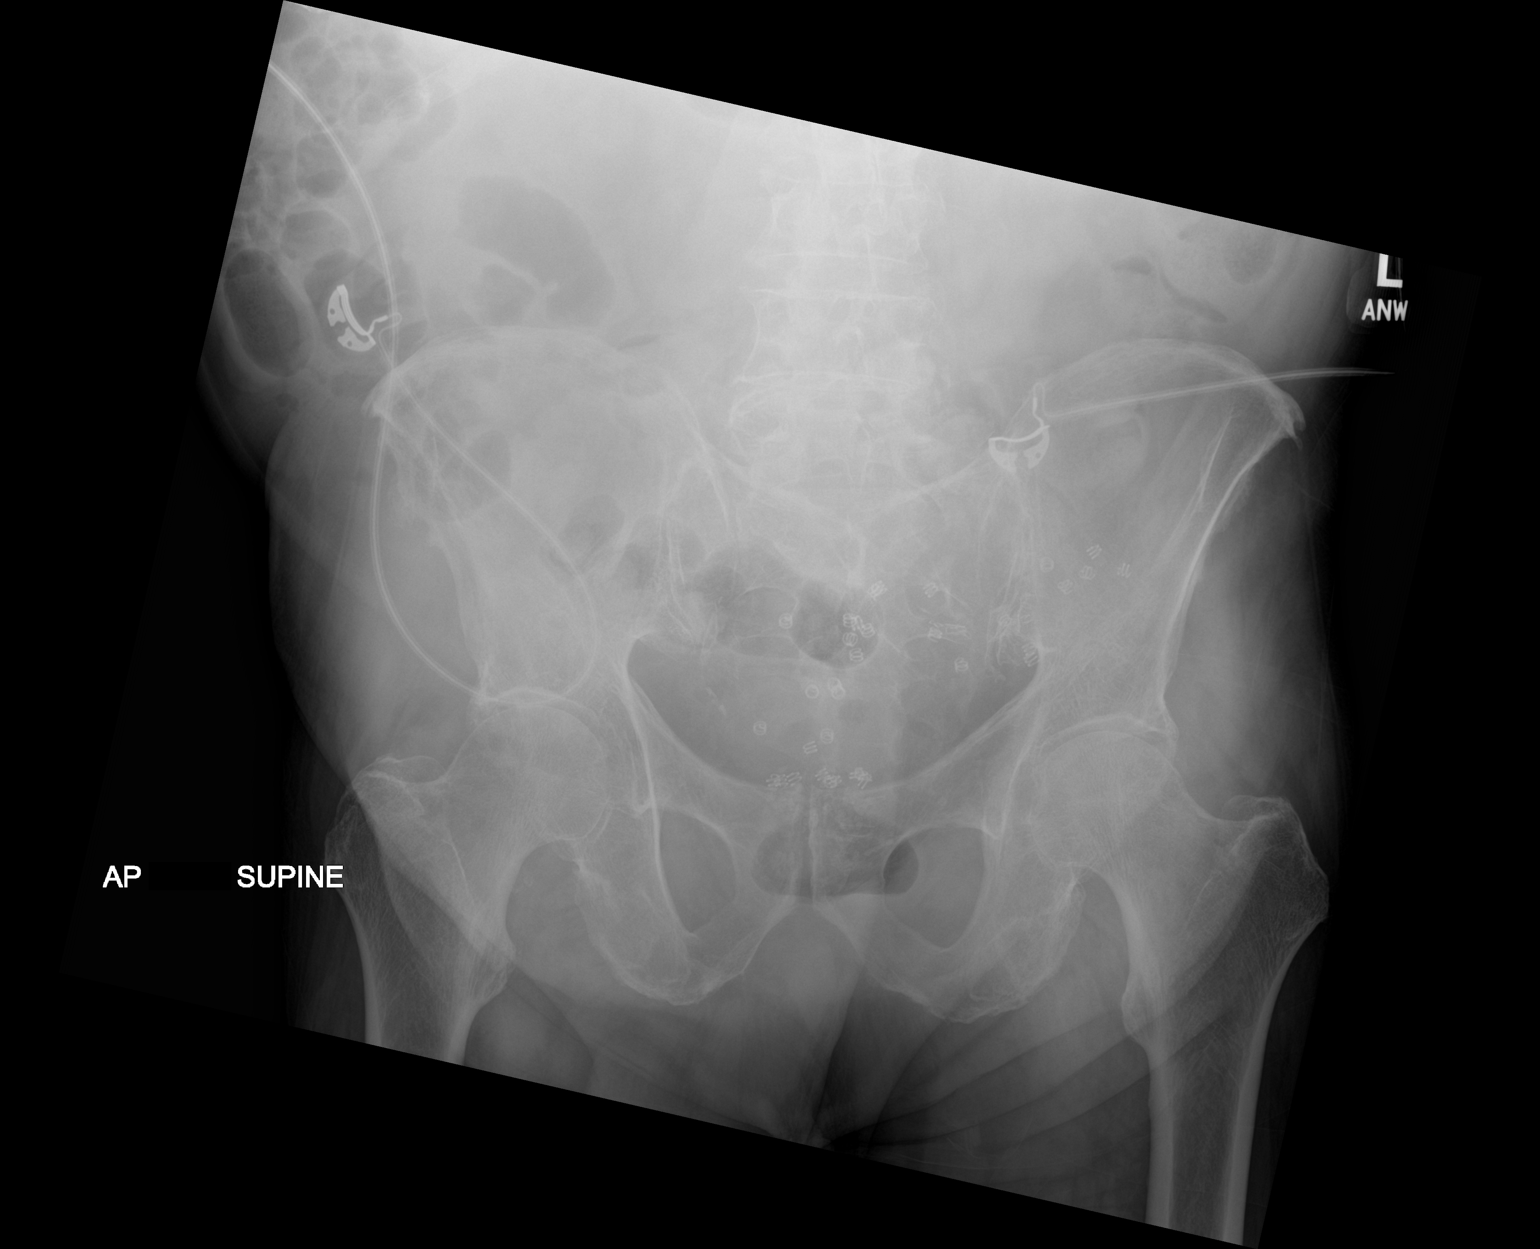

[2 of 2 positions shown; findings below may reference images not displayed]

FINDINGS: Hernia mesh projects over the pelvis. Degenerative changes in the
lumbar spine. No other bony abnormalities. No free air, portal
venous gas, or pneumatosis. No bowel obstruction.
IMPRESSION: No acute abnormalities identified.

## 2023-04-17 IMAGING — XA IR KYPHO VERTEBRAL ADDL AGMENTATION
1 series · 13 of 18 positions shown · non-contrast
Comparison: MRI the lumbosacral spine January 29, 2021.

INDICATION: Severe low back pain secondary to compression fractures at T12 and
L1.

EXAM:
BALLOON KYPHOPLASTIES AT T12 AND L1

[Series 300: spine · 13 of 18 slices shown]
[im 1/18]
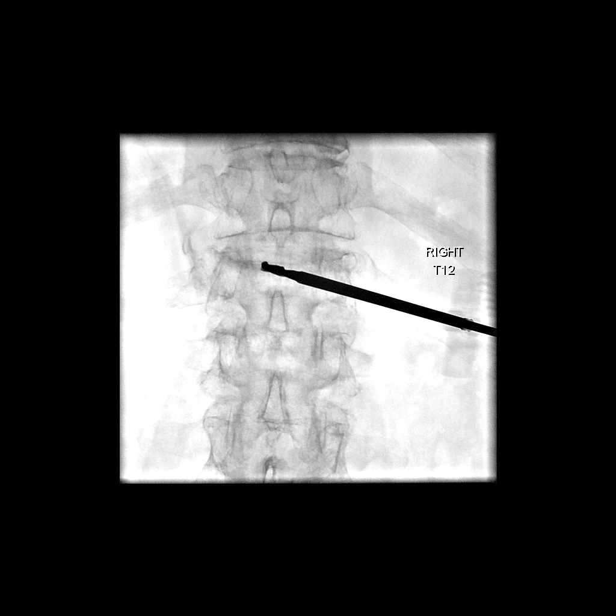
[im 3/18]
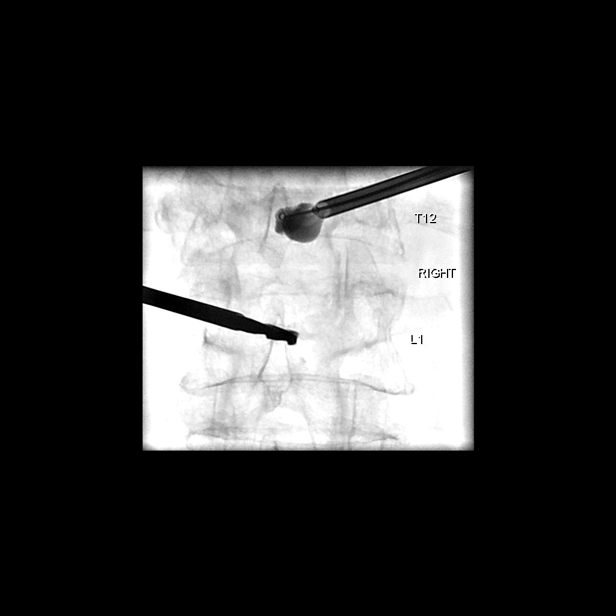
[im 4/18]
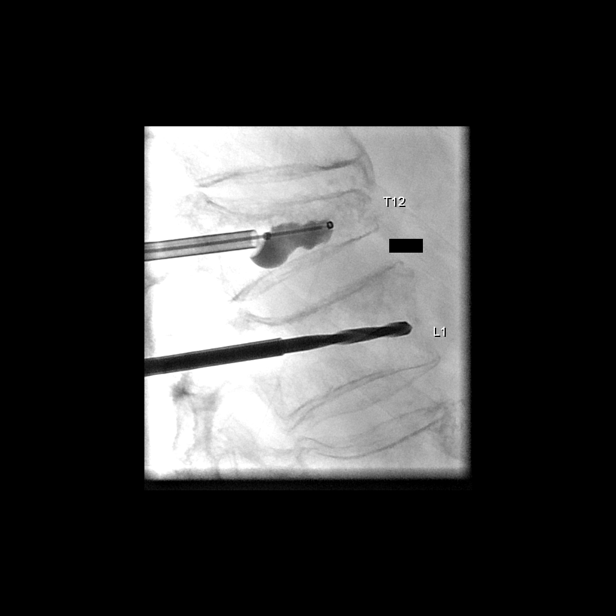
[im 5/18]
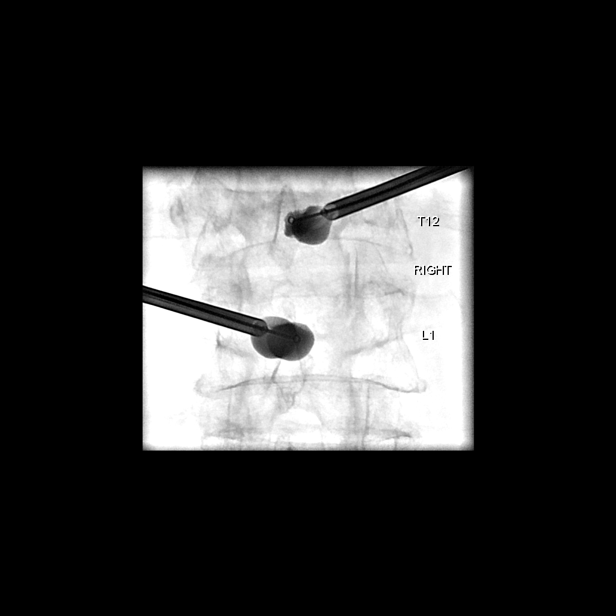
[im 7/18]
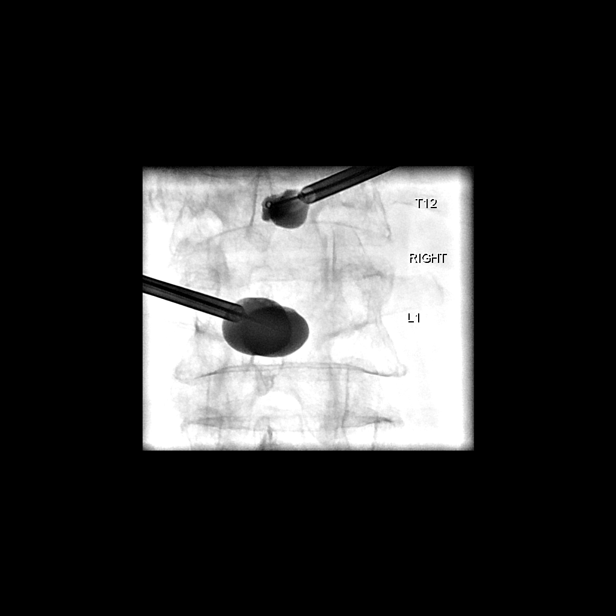
[im 8/18]
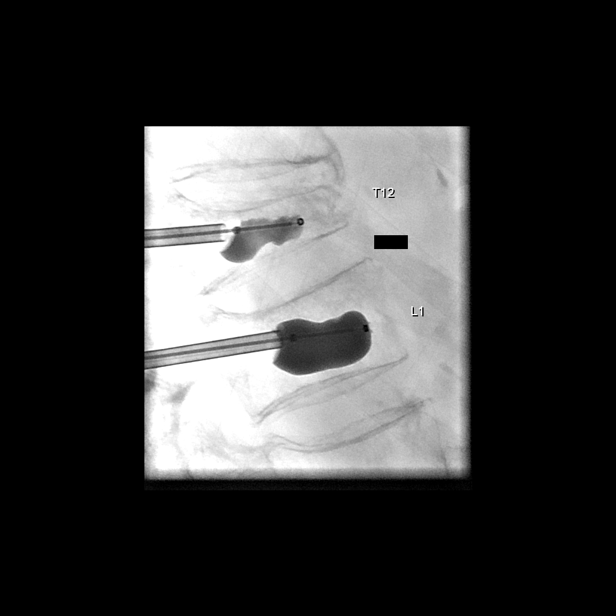
[im 10/18]
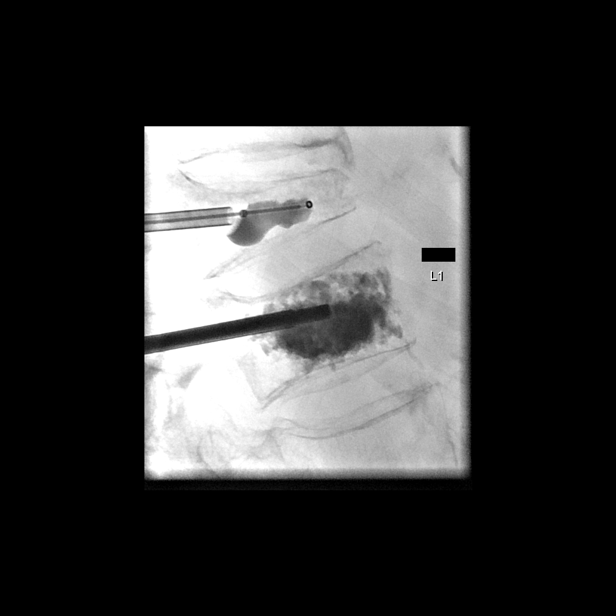
[im 11/18]
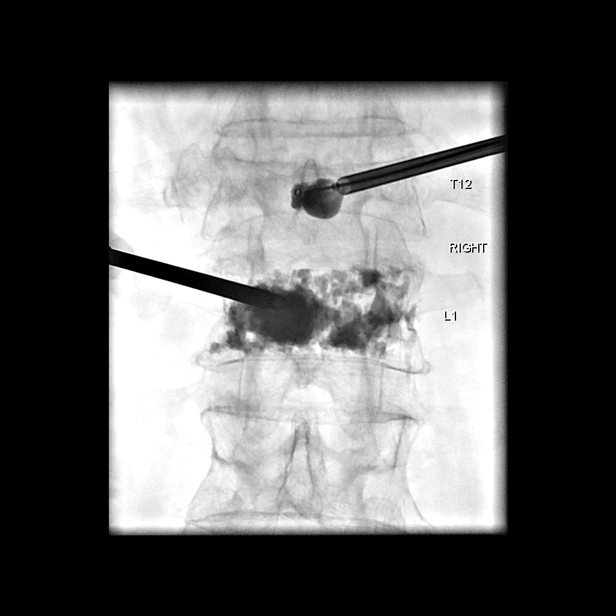
[im 12/18]
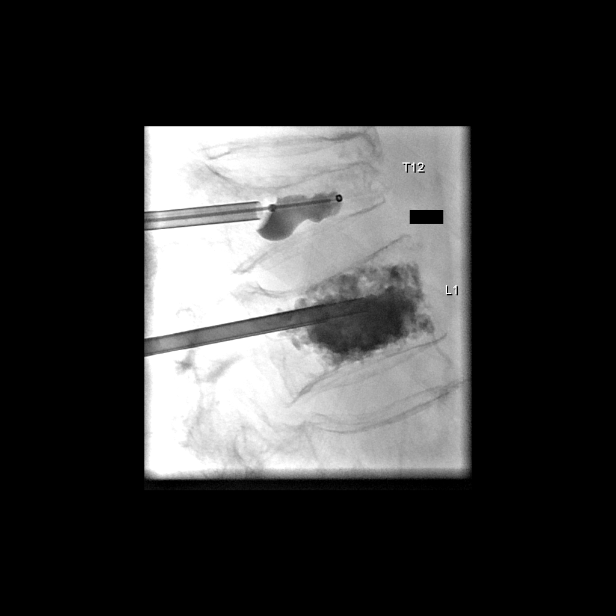
[im 14/18]
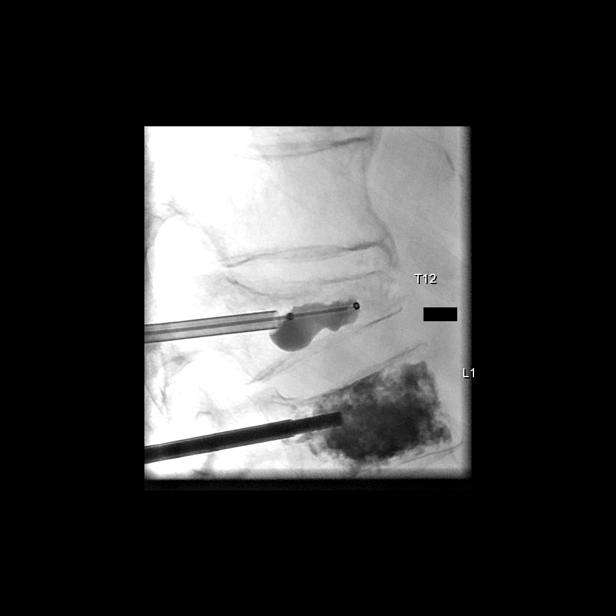
[im 15/18]
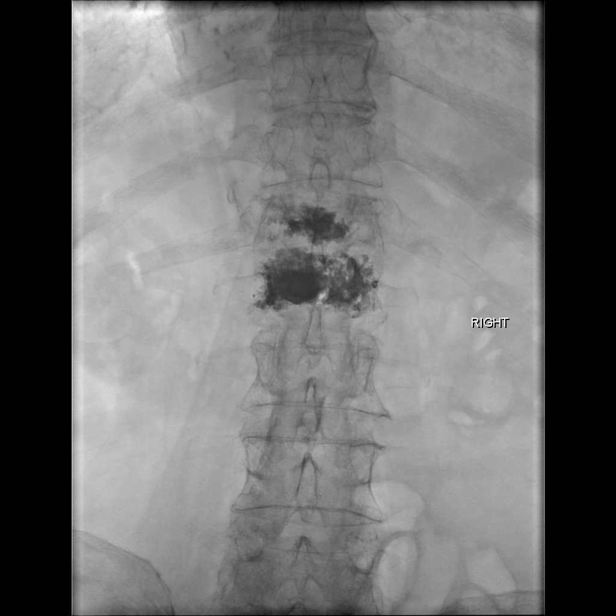
[im 16/18]
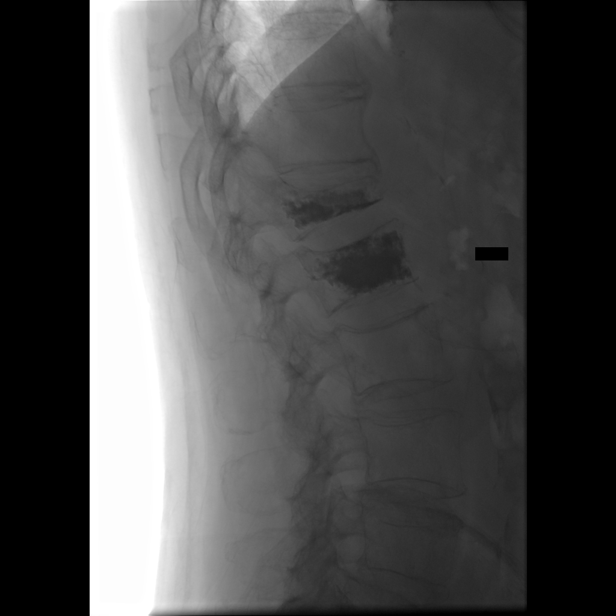
[im 18/18]
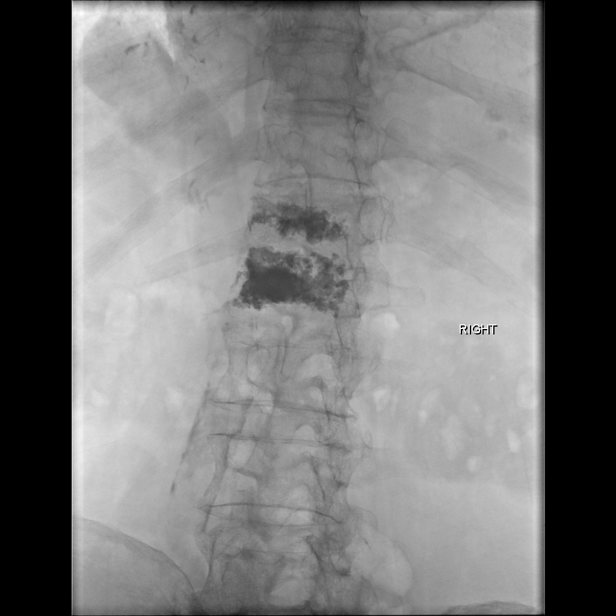

[13 of 18 positions shown; findings below may reference images not displayed]

MEDICATIONS:
As antibiotic prophylaxis, none was ordered pre-procedure and
administered intravenously within 1 hour of incision.

ANESTHESIA/SEDATION:
Moderate (conscious) sedation was employed during this procedure. A
total of Versed 2 mg and Fentanyl 62.5 mcg was administered
intravenously.

Moderate Sedation Time: 38 minutes. The patient's level of
consciousness and vital signs were monitored continuously by
radiology nursing throughout the procedure under my direct
supervision.

FLUOROSCOPY TIME:  Fluoroscopy Time: 17 minutes 54 seconds
([id number])

COMPLICATIONS:
None immediate.

PROCEDURE:
Following a full explanation of the procedure along with the
potential associated complications, an informed witnessed consent
was obtained.

The patient was placed prone on the fluoroscopic table. The skin
overlying the thoracolumbar region was then prepped and draped in
the usual sterile fashion. The right pedicle at T12, and left
pedicle at L1 were then infiltrated with 0.25% bupivacaine followed
by the advancement of an 11-gauge Jamshidi needle through the right
pedicle at T12 and the left pedicle at L1 into the posterior
one-third at the two levels. These were then exchanged for a Kyphon
advanced osteo introducer system comprised of a working cannula and
a Kyphon osteo drill.

This combinations were then advanced over a Kyphon osteo bone pin
until the tips of the Kyphon osteo drills were in the posterior
third at the two levels.

At this time, the bone pins were removed. In a medial trajectory,
the combinations were was advanced until the tips of the working
cannulae were inside the posterior one-third at the two levels.

The osteo drills were removed.

Through the working cannula, a Kyphon inflatable bone tamp 20 x 3
were advanced and positioned with the distal marker 5 mm from the
anterior aspect of T12 and L1. Crossing of the midline was seen on
the AP projection. At this time, the balloons were expanded using
contrast via a Kyphon inflation syringe device via microtubing.

Inflations were continued until there was apposition with the
superior and the inferior endplates.

At this time, methylmethacrylate mixture was reconstituted with
Tobramycin in the Kyphon bone mixing device system. This was then
loaded onto the Kyphon bone fillers.

The balloons were deflated and removed followed by the instillation
of 6 bone filler equivalents of methylmethacrylate mixture at L1,
and 3 bone filler equivalents at T12 with excellent filling in the
AP and lateral projections. No extravasation was noted in the disk
spaces or posteriorly into the spinal canal. No epidural venous
contamination was seen.

The working cannulae and the bone fillers were then retrieved and
removed.
IMPRESSION: 1. Status post vertebral body augmentation using balloon kyphoplasty
at T12 and L1 as described without event.

## 2023-04-18 ENCOUNTER — Ambulatory Visit: Payer: PPO | Admitting: Physical Therapy

## 2023-04-18 DIAGNOSIS — K122 Cellulitis and abscess of mouth: Secondary | ICD-10-CM | POA: Diagnosis not present

## 2023-04-19 ENCOUNTER — Other Ambulatory Visit: Payer: Self-pay | Admitting: Oncology

## 2023-04-19 ENCOUNTER — Telehealth: Payer: Self-pay | Admitting: Internal Medicine

## 2023-04-19 ENCOUNTER — Other Ambulatory Visit: Payer: Self-pay | Admitting: Unknown Physician Specialty

## 2023-04-19 ENCOUNTER — Ambulatory Visit: Admission: RE | Admit: 2023-04-19 | Payer: PPO | Source: Ambulatory Visit

## 2023-04-19 DIAGNOSIS — R059 Cough, unspecified: Secondary | ICD-10-CM

## 2023-04-19 DIAGNOSIS — C9000 Multiple myeloma not having achieved remission: Secondary | ICD-10-CM

## 2023-04-19 DIAGNOSIS — M7989 Other specified soft tissue disorders: Secondary | ICD-10-CM

## 2023-04-19 DIAGNOSIS — R0602 Shortness of breath: Secondary | ICD-10-CM | POA: Diagnosis not present

## 2023-04-19 NOTE — Telephone Encounter (Signed)
I agree with plan outlined in Ms. Thompson's note.  If possible, it would be helpful to add a BNP onto labs drawn earlier today.  I have reviewed Mr. Sundy chest radiograph, which does not show evidence of pulmonary edema.  I encouraged him to elevate his legs when possible and to minimize his sodium intake.  If BNP is elevated, we could consider adding low-dose diuretic, though we will need to monitor his renal function carefully in the setting of his myeloma and CKD.  Yvonne Kendall, MD [facility]

## 2023-04-19 NOTE — Telephone Encounter (Signed)
Called patient wife back. She states that for the 3 days they have noticed an increase in leg swelling. Patient wife states they only weigh once a week and his current weight was 163 lb. I advised to start weighing daily and notify us if any increase of weight 3lbs in a day, or 5lbs in a week. They also are currently getting a chest xray (results will be in epic for review). They have tried compression stockings and elevation with no improvement of the swelling. He is not currently on a fluid pill. Recent blood work in the system (most recent from yesterday and today is under [facility] DXA in epic for review as well) no current BNP level, they had mentioned that someone told them it could be CHF so they were concerned. Patient is not currently SOB, but I did advise of when to seek emergency care. Patient wife verbalized understanding.  Dr.End, would you like to get a BNP level? They did not wish to schedule for an APP at this time as they will wait to see what you recommend.   Thank you!

## 2023-04-19 NOTE — Telephone Encounter (Signed)
Pt c/o swelling/edema: STAT if pt has developed SOB within 24 hours  If swelling, where is the swelling located?   Legs and feet  How much weight have you gained and in what time span?  Yes    Have you gained 2 pounds in a day or 5 pounds in a week? No  Do you have a log of your daily weights (if so, list)?   No  Are you currently taking a fluid pill?  No  Are you currently SOB? No  Have you traveled recently in a car or plane for an extended period of time? No  Wife stated patient's legs have been swelling and has bronchial coughing.  Wife wants to know next steps.

## 2023-04-20 ENCOUNTER — Ambulatory Visit: Payer: PPO

## 2023-04-20 NOTE — Telephone Encounter (Signed)
Pt's wife advised of MD's recommendations Nurse unable to add BNP to recent lab draw. Wife stated they are ok to come tomorrow to have labs drawn. Order placed

## 2023-04-21 ENCOUNTER — Encounter: Payer: Self-pay | Admitting: Oncology

## 2023-04-21 ENCOUNTER — Other Ambulatory Visit: Payer: Self-pay | Admitting: *Deleted

## 2023-04-21 ENCOUNTER — Inpatient Hospital Stay: Payer: PPO | Admitting: Infectious Diseases

## 2023-04-21 DIAGNOSIS — M7989 Other specified soft tissue disorders: Secondary | ICD-10-CM | POA: Diagnosis not present

## 2023-04-21 DIAGNOSIS — C9000 Multiple myeloma not having achieved remission: Secondary | ICD-10-CM

## 2023-04-21 MED ORDER — APIXABAN 5 MG PO TABS
5.0000 mg | ORAL_TABLET | Freq: Two times a day (BID) | ORAL | 0 refills | Status: DC
Start: 2023-04-21 — End: 2023-07-20

## 2023-04-22 ENCOUNTER — Telehealth: Payer: Self-pay | Admitting: Internal Medicine

## 2023-04-22 NOTE — Telephone Encounter (Signed)
Pt c/o swelling: STAT is pt has developed SOB within 24 hours  How much weight have you gained and in what time span? 167 lbs - two weeks ago  173 lbs - 08/07 177 lbs - 08/09  If swelling, where is the swelling located? Both feet and legs.   Are you currently taking a fluid pill? No   Are you currently SOB? No   Do you have a log of your daily weights (if so, list)?   Have you gained 3 pounds in a day or 5 pounds in a week? 5 lbs in two days   Have you traveled recently? No  Home health nurse calling to give update on swelling. Requesting patient be called  back to discuss swelling.

## 2023-04-24 DIAGNOSIS — K122 Cellulitis and abscess of mouth: Secondary | ICD-10-CM | POA: Diagnosis not present

## 2023-04-25 ENCOUNTER — Ambulatory Visit: Payer: Self-pay | Admitting: *Deleted

## 2023-04-25 ENCOUNTER — Ambulatory Visit: Payer: PPO | Admitting: Physical Therapy

## 2023-04-25 DIAGNOSIS — K122 Cellulitis and abscess of mouth: Secondary | ICD-10-CM | POA: Diagnosis not present

## 2023-04-25 NOTE — Telephone Encounter (Signed)
Pt and pt's wife has been notified. Please see result note

## 2023-04-25 NOTE — Patient Outreach (Signed)
  Care Coordination   Follow Up Visit Note   04/25/2023 Name: JERMAIN VAUGH MRN: [id number] DOB: [birth year]  [name] is a 89 y.o. year old male who sees Dorothey Baseman, MD for primary care. I spoke with  [name] by phone today.  What matters to the patients health and wellness today?  Admitted to hospital for facial abscess since last outreach, report he is much better now.  Denies swelling, weight reported stable.     Goals Addressed             This Visit's Progress    COMPLETED: Effective management of back pain   On track    Care Coordination Interventions: Reviewed provider established plan for pain management Discussed importance of adherence to all scheduled medical appointments Counseled on the importance of reporting any/all new or changed pain symptoms or management strategies to pain management provider Discussed use of relaxation techniques and/or diversional activities to assist with pain reduction (distraction, imagery, relaxation, massage, acupressure, TENS, heat, and cold application Reviewed with patient prescribed pharmacological and nonpharmacological pain relief strategies  8/12 - denies back pain today     Management of chronic conditions       Interventions Today    Flowsheet Row Most Recent Value  Chronic Disease   Chronic disease during today's visit Other, Atrial Fibrillation (AFib), Congestive Heart Failure (CHF)  [facial abscess]  General Interventions   General Interventions Discussed/Reviewed General Interventions Reviewed, Doctor Visits  Doctor Visits Discussed/Reviewed Doctor Visits Reviewed, PCP, Specialist  [Oncology tomorrow, CT on 8/19, ID on 8/22, and cardiaothoracic on 9/3]  PCP/Specialist Visits Compliance with follow-up visit  Exercise Interventions   Exercise Discussed/Reviewed Weight Managment  Weight Management Weight maintenance  Education Interventions   Education Provided Provided Education  Provided Verbal  Education On Medication, When to see the doctor, Other, Nutrition  Nutrition Interventions   Nutrition Discussed/Reviewed Nutrition Reviewed, Supplemental nutrition, Adding fruits and vegetables, Portion sizes, Increasing proteins              SDOH assessments and interventions completed:  No     Care Coordination Interventions:  Yes, provided   Follow up plan: Follow up call scheduled for 9/12    Encounter Outcome:  Pt. Visit Completed   Kemper Durie, RN, MSN, [facility] Care Management Care Management Coordinator [phone]

## 2023-04-26 ENCOUNTER — Other Ambulatory Visit: Payer: Self-pay | Admitting: *Deleted

## 2023-04-26 ENCOUNTER — Encounter: Payer: Self-pay | Admitting: Oncology

## 2023-04-26 ENCOUNTER — Inpatient Hospital Stay: Payer: PPO | Attending: Oncology | Admitting: Oncology

## 2023-04-26 VITALS — BP 111/76 | HR 72 | Temp 95.5°F | Resp 18 | Ht 67.0 in | Wt 172.8 lb

## 2023-04-26 DIAGNOSIS — D649 Anemia, unspecified: Secondary | ICD-10-CM | POA: Diagnosis not present

## 2023-04-26 DIAGNOSIS — D72819 Decreased white blood cell count, unspecified: Secondary | ICD-10-CM | POA: Diagnosis not present

## 2023-04-26 DIAGNOSIS — C9000 Multiple myeloma not having achieved remission: Secondary | ICD-10-CM | POA: Insufficient documentation

## 2023-04-26 DIAGNOSIS — Z87891 Personal history of nicotine dependence: Secondary | ICD-10-CM | POA: Insufficient documentation

## 2023-04-26 DIAGNOSIS — Z79899 Other long term (current) drug therapy: Secondary | ICD-10-CM | POA: Diagnosis not present

## 2023-04-26 DIAGNOSIS — D696 Thrombocytopenia, unspecified: Secondary | ICD-10-CM | POA: Insufficient documentation

## 2023-04-26 DIAGNOSIS — G8929 Other chronic pain: Secondary | ICD-10-CM | POA: Insufficient documentation

## 2023-04-26 DIAGNOSIS — Z808 Family history of malignant neoplasm of other organs or systems: Secondary | ICD-10-CM | POA: Diagnosis not present

## 2023-04-26 MED ORDER — MORPHINE SULFATE ER 30 MG PO TBCR: 30.0000 mg | EXTENDED_RELEASE_TABLET | Freq: Three times a day (TID) | ORAL | 0 refills | Status: DC

## 2023-04-26 MED ORDER — OXYCODONE HCL 10 MG PO TABS: 10.0000 mg | ORAL_TABLET | ORAL | 0 refills | Status: DC | PRN

## 2023-04-26 NOTE — Progress Notes (Signed)
[facility]  [phone] [phone]  ID: [name] OB: 1933-04-28  MR#: [id number]  AOZ#:[id number]  Patient Care Team: Dorothey Baseman, MD as PCP - General (Family Medicine) End, Cristal Deer, MD as PCP - Cardiology (Cardiology) Quentin Cornwall, MD (Endocrinology) Jeralyn Ruths, MD as Consulting Physician (Hematology and Oncology) Kemper Durie, RN as [facility] Care Management   CHIEF COMPLAINT: Stage II kappa chain myeloma.  INTERVAL HISTORY: Patient returns to clinic today for hospital follow-up after being admitted for a tooth abscess and sepsis.  He continues to have a PICC line and is receiving IV antibiotics.  He otherwise feels well and is nearly back to his baseline.  Revlimid is currently on hold.  He has no neurologic complaints.  He denies any fevers.  He has a good appetite and denies weight loss.  He denies chest pain, shortness of breath, cough, or hemoptysis.  He denies any nausea, vomiting, constipation, or diarrhea.  He has no urinary complaints.  Patient offers no further specific complaints today.  REVIEW OF SYSTEMS:   Review of Systems  Constitutional: Negative.  Negative for fever and malaise/fatigue.  Respiratory: Negative.  Negative for cough, hemoptysis and shortness of breath.   Cardiovascular: Negative.  Negative for chest pain and leg swelling.  Gastrointestinal: Negative.  Negative for abdominal pain.  Genitourinary: Negative.  Negative for dysuria and flank pain.  Musculoskeletal:  Positive for back pain. Negative for falls and joint pain.  Skin: Negative.  Negative for rash.  Neurological: Negative.  Negative for dizziness, focal weakness, weakness and headaches.  Psychiatric/Behavioral: Negative.  The patient is not nervous/anxious.     As per HPI. Otherwise, a complete review of systems is negative.  PAST MEDICAL HISTORY: Past Medical History:  Diagnosis Date   Ascending aortic  aneurysm (HCC)    a. 12/2020 Echo: Asc Ao 48mm, Ao root 40mm. b. 03/2022 Asc Ao 4.6 cm (4.5 cm in 2019)   CKD (chronic kidney disease), stage III (HCC)    Compression fracture of body of thoracic vertebra (HCC)    Diastolic dysfunction    a. 12/2020 Echo: EF 50-55%, no rwma, mild LVH, GrI DD, nl RV fxn, mild AI. Asc Ao 48mm, Ao root 40mm.   Elevated prostate specific antigen (PSA)    has been 7 for a year    GERD (gastroesophageal reflux disease)    History of colon polyps 2008   [facility],    History of kidney stones    Hyperlipidemia    Hypertension    Myeloma (HCC)    PAF (paroxysmal atrial fibrillation) (HCC)    a. 01/2021-->Eliquis (CHA2DS2VASc = 3-4).   Pain    Prostate hypertrophy    diagnosed at age 89 due to hematospermia    PAST SURGICAL HISTORY: Past Surgical History:  Procedure Laterality Date   CATARACT EXTRACTION W/PHACO Left 01/10/2018   Procedure: CATARACT EXTRACTION PHACO AND INTRAOCULAR LENS PLACEMENT (IOC);  Surgeon: Galen Manila, MD;  Location: [facility] ORS;  Service: Ophthalmology;  Laterality: Left;  [name] 00:24.8 AP% 14.9 CDE 3.68 Fluid pack lot # [id number]   CATARACT EXTRACTION W/PHACO Right 01/25/2018   Procedure: CATARACT EXTRACTION PHACO AND INTRAOCULAR LENS PLACEMENT (IOC);  Surgeon: Galen Manila, MD;  Location: [facility] ORS;  Service: Ophthalmology;  Laterality: Right;  [name] 00:42 AP% 10.8 CDE 4.59 Fluid pack lot # [id number]   COLON SURGERY     CYSTOSCOPY W/ URETERAL STENT PLACEMENT Right 10/16/2017   Procedure: right  URETERAL STENT  PLACEMENT,cystoscopy bilateral stent removal,rretrograde;  Surgeon: Riki Altes, MD;  Location: [facility] ORS;  Service: Urology;  Laterality: Right;   CYSTOSCOPY/URETEROSCOPY/HOLMIUM LASER/STENT PLACEMENT Right 12/16/2020   Procedure: CYSTOSCOPY/URETEROSCOPY/HOLMIUM LASER/STENT PLACEMENT;  Surgeon: Riki Altes, MD;  Location: [facility] ORS;  Service: Urology;  Laterality: Right;   EXTRACORPOREAL SHOCK WAVE LITHOTRIPSY Right  12/11/2020   Procedure: EXTRACORPOREAL SHOCK WAVE LITHOTRIPSY (ESWL);  Surgeon: Riki Altes, MD;  Location: [facility] ORS;  Service: Urology;  Laterality: Right;   IR BONE TUMOR(S)RF ABLATION  04/16/2022   IR KYPHO EA ADDL LEVEL THORACIC OR LUMBAR  02/02/2021   IR KYPHO LUMBAR INC FX REDUCE BONE BX UNI/BIL CANNULATION INC/IMAGING  02/02/2021   IR KYPHO THORACIC WITH BONE BIOPSY  04/15/2022   KIDNEY STONE SURGERY     KYPHOPLASTY N/A 03/12/2021   Procedure: Nicki Reaper, L3;  Surgeon: Kennedy Bucker, MD;  Location: [facility] ORS;  Service: Orthopedics;  Laterality: N/A;   RESECTION SOFT TISSUE TUMOR LEG / ANKLE RADICAL  jan 2009   [facility],  right thigh/knee , nonmalignant   SMALL INTESTINE SURGERY  1946   implaed on picket fence, punctured stomach   TONSILLECTOMY      FAMILY HISTORY: Family History  Problem Relation Age of Onset   Hypertension Father    Hyperlipidemia Father    Cancer Sister        thyroid - dx in late 20's    ADVANCED DIRECTIVES (Y/N):  N  HEALTH MAINTENANCE: Social History   Tobacco Use   Smoking status: Former    Current packs/day: 0.00    Types: Cigarettes    Quit date: 07/05/1965    Years since quitting: 57.8   Smokeless tobacco: Current   Tobacco comments:    Occasional cigar  Vaping Use   Vaping status: Never Used  Substance Use Topics   Alcohol use: Not Currently    Alcohol/week: 1.0 standard drink of alcohol    Types: 1 Standard drinks or equivalent per week    Comment: occassionaly   Drug use: No     Colonoscopy:  PAP:  Bone density:  Lipid panel:  Allergies  Allergen Reactions   Quinolones     Aortic dilation contraindicates FQ   Amlodipine Swelling and Other (See Comments)    LE edema   Azithromycin Nausea And Vomiting   Lipitor [Atorvastatin]     Muscle pain in RT Leg    Lisinopril Cough   Zetia [Ezetimibe]     Pain in legs    Hydromorphone Hcl     Wife states "Medication makes extremely sedated and lethargic"    Current Outpatient  Medications  Medication Sig Dispense Refill   apixaban (ELIQUIS) 5 MG TABS tablet Take 1 tablet (5 mg total) by mouth 2 (two) times daily. 180 tablet 0   Ascorbic Acid (VITAMIN C) 1000 MG tablet Take 1,000 mg by mouth in the morning and at bedtime.     Calcium Carb-Cholecalciferol (OYSTER SHELL CALCIUM W/D) 500-5 MG-MCG TABS Take 1 tablet by mouth in the morning and at bedtime.     carvedilol (COREG) 12.5 MG tablet Take 0.5 tablets (6.25 mg total) by mouth 2 (two) times daily. 180 tablet 3   cefTRIAXone (ROCEPHIN) IVPB Inject 2 g into the vein daily. 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 First Dose: Yes Last Day of Therapy:  05/11/2023 Labs - Once weekly:  CBC/D, CMP, ESR and CRP Fax weekly lab results  promptly to [phone] Please leave PIC in place  until doctor has seen patient or been notified Method of administration: IV Push Method of administration may be changed at the discretion of home infusion pharmacist based upon assessment of the patient and/or caregiver's ability to self-administer the medication ordered. 35 Units 0   gabapentin (NEURONTIN) 300 MG capsule Take 2 capsules (600 mg total) by mouth 3 (three) times daily. 180 capsule 2   ibuprofen (ADVIL) 200 MG tablet Take 200 mg by mouth 2 (two) times daily as needed.     lidocaine (XYLOCAINE) 5 % ointment Apply topically 3 (three) times daily as needed for mild pain or moderate pain. (Patient taking differently: Apply 1 Application topically 3 (three) times daily as needed for mild pain or moderate pain. Apply to back) 50 g 2   metroNIDAZOLE (FLAGYL) 500 MG tablet Take 1 tablet (500 mg total) by mouth every 12 (twelve) hours. 70 tablet 0   morphine (MS CONTIN) 30 MG 12 hr tablet Take 1 tablet (30 mg total) by mouth every 8 (eight) hours. 90 tablet 0   Multiple Vitamin (MULTIVITAMIN WITH MINERALS) TABS tablet Take 1 tablet by mouth daily.     naloxone (NARCAN) nasal spray 4 mg/0.1 mL  SPRAY 1 SPRAY INTO ONE NOSTRIL AS DIRECTED FOR OPIOID OVERDOSE (TURN PERSON ON SIDE AFTER DOSE. IF NO RESPONSE IN 2-3 MINUTES OR PERSON RESPONDS BUT RELAPSES, REPEAT USING A NEW SPRAY DEVICE AND SPRAY INTO THE OTHER NOSTRIL. CALL 911 AFTER USE.) * EMERGENCY USE ONLY * 1 each 0   omeprazole (PRILOSEC) 20 MG capsule Take 20 mg by mouth daily as needed (takes when taking ibuprofen).     Oxycodone HCl 10 MG TABS Take 1 tablet (10 mg total) by mouth every 4 (four) hours as needed (pain). 90 tablet 0   polyethylene glycol (MIRALAX / GLYCOLAX) 17 g packet Take 17 g by mouth 2 (two) times daily.  0   potassium citrate (UROCIT-K) 10 MEQ (1080 MG) SR tablet TAKE 1 TABLET (10 MEQ TOTAL) BY MOUTH 3 (THREE) TIMES DAILY WITH MEALS. 270 tablet 2   senna-docusate (SENOKOT-S) 8.6-50 MG tablet Take 1 tablet by mouth 2 (two) times daily. 30 tablet 0   acetaminophen (TYLENOL) 500 MG tablet Take 500-1,000 mg by mouth every 8 (eight) hours as needed for moderate pain. (Patient not taking: Reported on 04/26/2023)     dexamethasone (DECADRON) 4 MG tablet TAKE 5 TABLETS BY MOUTH ONE TIME PER WEEK (Patient taking differently: Take 20 mg by mouth once a week.) 20 tablet 4   feeding supplement (ENSURE ENLIVE / ENSURE PLUS) LIQD Take 237 mLs by mouth 3 (three) times daily between meals. (Patient taking differently: Take 237 mLs by mouth daily with lunch.) 237 mL 12   Multiple Vitamins-Minerals (PRESERVISION AREDS PO) Take 1 capsule by mouth in the morning and at bedtime. (Patient not taking: Reported on 04/26/2023)     No current facility-administered medications for this visit.    OBJECTIVE: Vitals:   04/26/23 1016  BP: 111/76  Pulse: 72  Resp: 18  Temp: (!) 95.5 F (35.3 C)  SpO2: 99%        Body mass index is 27.06 kg/m.    ECOG FS:1 - Symptomatic but completely ambulatory  General: Well-developed, well-nourished, no acute distress. Eyes: Pink conjunctiva, anicteric sclera. HEENT: Normocephalic, moist mucous  membranes. Lungs: No audible wheezing or coughing. Heart: Regular rate and rhythm. Abdomen: Soft, nontender, no obvious distention. Musculoskeletal: No edema, cyanosis, or clubbing. Neuro: Alert, answering all questions appropriately. Cranial nerves  grossly intact. Skin: No rashes or petechiae noted. Psych: Normal affect.  LAB RESULTS:  Lab Results  Component Value Date   NA 135 04/06/2023   K 4.0 04/06/2023   CL 113 (H) 04/06/2023   CO2 18 (L) 04/06/2023   GLUCOSE 146 (H) 04/06/2023   BUN 35 (H) 04/06/2023   CREATININE 0.91 04/06/2023   CALCIUM 7.5 (L) 04/06/2023   PROT 5.4 (L) 04/04/2023   ALBUMIN 2.9 (L) 04/04/2023   AST 16 04/04/2023   ALT 12 04/04/2023   ALKPHOS 70 04/04/2023   BILITOT 0.5 04/04/2023   GFRNONAA >60 04/06/2023   GFRAA 29 (L) 10/16/2017    Lab Results  Component Value Date   WBC 3.8 (L) 04/06/2023   NEUTROABS 3.8 04/03/2023   HGB 9.9 (L) 04/06/2023   HCT 30.1 (L) 04/06/2023   MCV 96.8 04/06/2023   PLT 85 (L) 04/06/2023     STUDIES: DG Chest 2 View  Result Date: 04/25/2023 CLINICAL DATA:  Shortness of breath.  Cough. EXAM: CHEST - 2 VIEW COMPARISON:  04/03/2023 FINDINGS: Heart size is normal. The aorta is tortuous. There chronic calcified granulomas. No sign of infiltrate, collapse or effusion. Multiple old augmented lower thoracic and upper lumbar vertebral fractures. No acute bone finding. Right arm PICC catheter in place. The tip of the catheter is in the SVC above the right atrium. IMPRESSION: No active disease. Old granulomatous disease. Tortuous aorta. Multiple old augmented vertebral fractures. Electronically Signed   By: Paulina Fusi M.D.   On: 04/25/2023 07:51   [name] EKG SITE RITE  Result Date: 04/06/2023 If Site Rite image not attached, placement could not be confirmed due to current cardiac rhythm.  CT Soft Tissue Neck W Contrast  Result Date: 04/03/2023 CLINICAL DATA:  Soft tissue infection suspected, neck, xray done. EXAM: CT NECK  WITH CONTRAST TECHNIQUE: Multidetector CT imaging of the neck was performed using the standard protocol following the bolus administration of intravenous contrast. RADIATION DOSE REDUCTION: This exam was performed according to the departmental dose-optimization program which includes automated exposure control, adjustment of the mA and/or kV according to patient size and/or use of iterative reconstruction technique. CONTRAST:  75mL OMNIPAQUE IOHEXOL 300 MG/ML  SOLN COMPARISON:  Thoracic spine MRI 01/18/2023. FINDINGS: Pharynx and larynx: Normal. No mass or swelling. Salivary glands: No inflammation, mass, or stone. Thyroid: Normal. Lymph nodes: No suspicious cervical lymphadenopathy. Vascular: Atherosclerotic calcifications of the carotid bulbs. Limited intracranial: Unremarkable. Visualized orbits: Unremarkable. Mastoids and visualized paranasal sinuses: Well aerated. Skeleton: Fluid collection centered around the mandibular symphysis, measuring up to 3.6 x 2.4 x 2.6 cm (coronal image 11 series 4, sagittal image 66 series 5) with surrounding inflammation, suspicious for abscess. No underlying bony abnormality. Heterogeneous sclerosis of the C7 vertebral body and T1 superior endplate, unchanged from the prior thoracic spine MRI and likely reflecting treated myeloma. Upper chest: Unremarkable. Other: None. IMPRESSION: 1. Fluid collection centered around the mandibular symphysis, measuring up to 3.6 cm, suspicious for abscess. No underlying bony abnormality. 2. Heterogeneous sclerosis of the C7 vertebral body and T1 superior endplate, unchanged from the prior thoracic spine MRI and likely reflecting treated myeloma. Electronically Signed   By: Orvan Falconer M.D.   On: 04/03/2023 15:19   DG Chest Port 1 View  Result Date: 04/03/2023 CLINICAL DATA:  Sepsis.  History of multiple myeloma. EXAM: PORTABLE CHEST 1 VIEW COMPARISON:  Chest radiographs 11/01/2022 FINDINGS: The cardiomediastinal silhouette is unchanged  with normal heart size. Lung volumes are decreased with a small  amount of hazy opacity in both mid lungs. Small calcified pleural nodules are again noted on the left. No sizable pleural effusion, overt pulmonary edema, or pneumothorax is identified. No acute osseous abnormality is seen. IMPRESSION: Low lung volumes with mild hazy opacities in both mid lungs for which early infection is not excluded. Electronically Signed   By: Sebastian Ache M.D.   On: 04/03/2023 12:15   CT Maxillofacial W Contrast  Result Date: 04/01/2023 CLINICAL DATA:  Sublingual/submandibular abscess. Acute onset of left jaw and neck swelling over the last week. Multiple myeloma. EXAM: CT MAXILLOFACIAL WITH CONTRAST TECHNIQUE: Multidetector CT imaging of the maxillofacial structures was performed with intravenous contrast. Multiplanar CT image reconstructions were also generated. RADIATION DOSE REDUCTION: This exam was performed according to the departmental dose-optimization program which includes automated exposure control, adjustment of the mA and/or kV according to patient size and/or use of iterative reconstruction technique. CONTRAST:  75mL OMNIPAQUE IOHEXOL 300 MG/ML  SOLN COMPARISON:  CT head without contrast 06/10/2022 FINDINGS: Osseous: No acute or focal osseous lesion is present. Left mandibular dental implants are noted. The anchors are unremarkable. Orbits: The globes and orbits are within normal limits. Sinuses: The paranasal sinuses and mastoid air cells are clear. Soft tissues: A peripherally enhancing fluid collection extends inferiorly and anteriorly from the anterior genu of the mandible it measures 0.7 x 3.5 by at least 10.0 mm. The inferior aspect of the collection is not imaged. The collection does not extend into the floor of the mouth. No breech of the mylohyoid is evident. Limited intracranial: Within normal limits. IMPRESSION: 1. Peripherally enhancing fluid collection extending inferiorly and anteriorly from the  anterior genu of the mandible it measures 0.7 x 3.5 by at least 10.0 mm, consistent with an abscess. The inferior aspect of the collection is not imaged. 2. The collection does not extend into the floor of the mouth. 3. Left mandibular dental implants are noted. The anchors are unremarkable. No odontogenic source for the infection is implicated. Electronically Signed   By: Marin Roberts M.D.   On: 04/01/2023 12:18    ASSESSMENT: Stage II Kappa chain myeloma.  PLAN:    Stage II kappa chain myeloma: (11:14 translocation, high risk) SPEP essentially negative and immunoglobulins are within normal limits.  Patient's kappa light chains initially improved from greater than 5400 down to 39.1.  He is now off Revlimid secondary to his abscess and subsequent sepsis and his most recent result on March 07, 2023 was 131.0.  Patient has been instructed to continue to hold Xgeva, Revlimid, and dexamethasone until he completes his IV antibiotics.  PET scan results from December 08, 2021 reviewed independently with no obvious evidence of progressive disease.  Nuclear med bone scan results from December 28, 2022 did not reveal any concerning pathology.  Patient initiated monthly Xgeva on August 11, 2021.  Return to clinic in 4 weeks for further evaluation and consideration of reinitiating treatment.   Anemia: Patient's most recent hemoglobin was 9.9.  Monitor. Bone lesions: Continue to hold Xgeva as above. Leukopenia: Mild, monitor. Thrombocytopenia: Chronic and unchanged.  Patient's most recent platelet count is 85.   Pain: Chronic and unchanged.  Continue follow-up with pain clinic as indicated.  Nuclear medicine bone scan completed on December 28, 2022 did not report any active areas of myeloma.  Patient only takes long-acting morphine 30 mg every 8 hours.  He also takes Tylenol and ibuprofen sparingly.  Previously he was instructed that if his platelets fall below 50  this would need to be discontinued.  Radiation  oncology has determined no further treatments are needed at this time.   Renal insufficiency: Resolved. Tooth abscess/sepsis: Patient needs on IV antibiotics with PICC line.  Continue follow-up with ID and ENT as scheduled.   Patient expressed understanding and was in agreement with this plan. He also understands that He can call clinic at any time with any questions, concerns, or complaints.     Cancer Staging  Kappa light chain myeloma (HCC) Staging form: Plasma Cell Myeloma and Plasma Cell Disorders, AJCC 8th Edition - Clinical stage from 04/16/2021: Albumin (g/dL): 3.8, ISS: Stage II, High-risk cytogenetics: Absent, LDH: Unknown - Signed by Jeralyn Ruths, MD on 04/16/2021 Albumin range (g/dL): Greater than or equal to 3.5 Cytogenetics: t(11;14) translocation Serum calcium level: Normal Serum creatinine level: Normal Bone disease on imaging: Present  Jeralyn Ruths, MD   04/26/2023 12:22 PM

## 2023-04-26 NOTE — Progress Notes (Signed)
Patient would like to discuss next steps regarding getting back on chemo medication. Patient would also like to discuss labs done through [facility] from home health visit. Patient's wife would like to know if he will be getting back on Xgeva due to bone issues and discuss last recent ED visit regarding abscess underneath chin.

## 2023-04-26 NOTE — Telephone Encounter (Signed)
Request for refill of Revlimid but this is on hold. Call returned to [employer] and informed of this

## 2023-04-27 ENCOUNTER — Ambulatory Visit: Payer: PPO | Admitting: Physical Therapy

## 2023-04-27 ENCOUNTER — Ambulatory Visit: Payer: PPO | Admitting: Pain Medicine

## 2023-04-27 LAB — LAB REPORT - SCANNED: EGFR: 75

## 2023-05-01 ENCOUNTER — Emergency Department: Payer: PPO

## 2023-05-01 ENCOUNTER — Other Ambulatory Visit: Payer: Self-pay

## 2023-05-01 ENCOUNTER — Emergency Department
Admission: EM | Admit: 2023-05-01 | Discharge: 2023-05-01 | Disposition: A | Discharge status: Home / Self Care | Payer: PPO | Source: Home / Self Care | Attending: Emergency Medicine | Admitting: Emergency Medicine

## 2023-05-01 DIAGNOSIS — K122 Cellulitis and abscess of mouth: Secondary | ICD-10-CM | POA: Diagnosis not present

## 2023-05-01 DIAGNOSIS — C9000 Multiple myeloma not having achieved remission: Secondary | ICD-10-CM | POA: Insufficient documentation

## 2023-05-01 DIAGNOSIS — M1612 Unilateral primary osteoarthritis, left hip: Secondary | ICD-10-CM | POA: Diagnosis not present

## 2023-05-01 DIAGNOSIS — S32512A Fracture of superior rim of left pubis, initial encounter for closed fracture: Secondary | ICD-10-CM | POA: Diagnosis not present

## 2023-05-01 DIAGNOSIS — R609 Edema, unspecified: Secondary | ICD-10-CM | POA: Diagnosis not present

## 2023-05-01 DIAGNOSIS — I10 Essential (primary) hypertension: Secondary | ICD-10-CM | POA: Insufficient documentation

## 2023-05-01 DIAGNOSIS — M79605 Pain in left leg: Secondary | ICD-10-CM | POA: Diagnosis not present

## 2023-05-01 DIAGNOSIS — M79652 Pain in left thigh: Secondary | ICD-10-CM | POA: Diagnosis not present

## 2023-05-01 LAB — COMPREHENSIVE METABOLIC PANEL
ALT: 7 U/L (ref 0–44)
AST: 16 U/L (ref 15–41)
Albumin: 3.2 g/dL — ABNORMAL LOW (ref 3.5–5.0)
Alkaline Phosphatase: 47 U/L (ref 38–126)
Anion gap: 9 (ref 5–15)
BUN: 21 mg/dL (ref 8–23)
CO2: 26 mmol/L (ref 22–32)
Calcium: 8.5 mg/dL — ABNORMAL LOW (ref 8.9–10.3)
Chloride: 103 mmol/L (ref 98–111)
Creatinine, Ser: 0.95 mg/dL (ref 0.61–1.24)
GFR, Estimated: >60 mL/min (ref >60)
Glucose, Bld: 107 mg/dL — ABNORMAL HIGH (ref 70–99)
Potassium: 3.9 mmol/L (ref 3.5–5.1)
Sodium: 138 mmol/L (ref 135–145)
Total Bilirubin: 0.6 mg/dL (ref 0.3–1.2)
Total Protein: 5.5 g/dL — ABNORMAL LOW (ref 6.5–8.1)

## 2023-05-01 LAB — CBC WITH DIFFERENTIAL/PLATELET
Abs Immature Granulocytes: 0.01 10*3/uL (ref 0.00–0.07)
Basophils Absolute: 0 10*3/uL (ref 0.0–0.1)
Basophils Relative: 0 %
Eosinophils Absolute: 0.1 10*3/uL (ref 0.0–0.5)
Eosinophils Relative: 2 %
HCT: 33.1 % — ABNORMAL LOW (ref 39.0–52.0)
Hemoglobin: 10.5 g/dL — ABNORMAL LOW (ref 13.0–17.0)
Immature Granulocytes: 0 %
Lymphocytes Relative: 25 %
Lymphs Abs: 1 10*3/uL (ref 0.7–4.0)
MCH: 33.3 pg (ref 26.0–34.0)
MCHC: 31.7 g/dL (ref 30.0–36.0)
MCV: 105.1 fL — ABNORMAL HIGH (ref 80.0–100.0)
Monocytes Absolute: 0.6 10*3/uL (ref 0.1–1.0)
Monocytes Relative: 15 %
Neutro Abs: 2.4 10*3/uL (ref 1.7–7.7)
Neutrophils Relative %: 58 %
Platelets: 46 10*3/uL — ABNORMAL LOW (ref 150–400)
RBC: 3.15 MIL/uL — ABNORMAL LOW (ref 4.22–5.81)
RDW: 19 % — ABNORMAL HIGH (ref 11.5–15.5)
Smear Review: DECREASED
WBC: 4.2 10*3/uL (ref 4.0–10.5)
nRBC: 0 % (ref 0.0–0.2)

## 2023-05-01 MED ORDER — OXYCODONE HCL 5 MG PO TABS
10.0000 mg | ORAL_TABLET | ORAL | Status: AC
  Administered 2023-05-01: 10 mg via ORAL
  Filled 2023-05-01: qty 2

## 2023-05-01 NOTE — ED Provider Notes (Signed)
[facility] Provider Note    Event Date/Time   First MD Initiated Contact with Patient 05/01/23 [phone]     (approximate)   History   Chief Complaint: Groin Pain   HPI  Jack [name] is a 89 y.o. male with a history of hypertension, multiple myeloma who comes the ED complaining of left inner thigh pain since yesterday.  Lasted physical therapy 2 days ago.  Denies falls or trauma.  No fever.  No chest pain or shortness of breath.  Able to bear weight but painful.     Physical Exam   Triage Vital Signs: ED Triage Vitals  Encounter Vitals Group     BP 05/01/23 0827 (!) 152/79     Systolic BP Percentile --      Diastolic BP Percentile --      Pulse Rate 05/01/23 0827 60     Resp 05/01/23 0827 20     Temp 05/01/23 0827 98.2 F (36.8 C)     Temp Source 05/01/23 0827 Oral     SpO2 05/01/23 0827 96 %     Weight 05/01/23 0830 171 lb 15.3 oz (78 kg)     Height 05/01/23 0830 5\' 7"  (1.702 m)     Head Circumference --      Peak Flow --      Pain Score 05/01/23 0828 5     Pain Loc --      Pain Education --      Exclude from Growth Chart --     Most recent vital signs: Vitals:   05/01/23 1100 05/01/23 1130  BP: (!) 164/96 (!) 151/88  Pulse: 66 78  Resp:    Temp:    SpO2: 96% 97%    General: Awake, no distress.  CV:  Good peripheral perfusion.  Regular rate and rhythm.  Normal distal pulses, strong dorsalis pedis pulse.  Feet are warm. Resp:  Normal effort.  Clear to auscultation bilaterally Abd:  No distention.  Soft nontender Other:  Left thigh normal, no swelling, no inflammatory soft tissue changes.  There is tenderness along the length of the gracilis muscle, most prominently at the distal tendon   ED Results / Procedures / Treatments   Labs (all labs ordered are listed, but only abnormal results are displayed) Labs Reviewed  COMPREHENSIVE METABOLIC PANEL - Abnormal; Notable for the following components:      Result Value   Glucose, Bld  107 (*)    Calcium 8.5 (*)    Total Protein 5.5 (*)    Albumin 3.2 (*)    All other components within normal limits  CBC WITH DIFFERENTIAL/PLATELET - Abnormal; Notable for the following components:   RBC 3.15 (*)    Hemoglobin 10.5 (*)    HCT 33.1 (*)    MCV 105.1 (*)    RDW 19.0 (*)    Platelets 46 (*)    All other components within normal limits     EKG    RADIOLOGY X-ray left femur interpreted by me, negative for fracture or bone lesion.  Radiology report reviewed.  Ultrasound left lower extremity negative for DVT.   PROCEDURES:  Procedures   MEDICATIONS ORDERED IN ED: Medications  oxyCODONE (Oxy IR/ROXICODONE) immediate release tablet 10 mg (10 mg Oral Given 05/01/23 1004)     IMPRESSION / MDM / ASSESSMENT AND PLAN / ED COURSE  I reviewed the triage vital signs and the nursing notes.  DDx: femur fracture, DVT, gracilis strain, myeloma bone  lesion  Patient's presentation is most consistent with acute presentation with potential threat to life or bodily function.  Patient presents with left thigh pain, reproducible on exam with outpatient along with an [place].  With his medical history, will obtain imaging and labs.   Clinical Course as of 05/01/23 1530  Sun May 01, 2023  1121 [name] and XR neg for acute findings. Stable for DC and cont. OP f/u. [PS]    Clinical Course User Index [PS] Sharman Cheek, MD     FINAL CLINICAL IMPRESSION(S) / ED DIAGNOSES   Final diagnoses:  Left thigh pain  Multiple myeloma, remission status unspecified (HCC)     Rx / DC Orders   ED Discharge Orders     None        Note:  This document was prepared using Dragon voice recognition software and may include unintentional dictation errors.   Sharman Cheek, MD 05/01/23 [phone]

## 2023-05-01 NOTE — ED Triage Notes (Signed)
Pt to ED for pain to L inner thigh pain since yesterday afternoon No cyanosis, pedal pulses +2 bilaterally  Hx multiple myeloma Has PICC line, wife gives 2 abx at home, 1 in PICC line and 1 by mouth. Had positive blood cultures about 4 weeks ago at [facility] Hospitalized late July Takes eliquis  EMS VS: 154/94, HR 60, RR 16, 97% RA, temp was not taken   Took 400mg  ibuprophen PTA, currently 5/10 pain  Wife wants to rule out blood clot. No redness or swelling noted, but pain with palpation

## 2023-05-01 NOTE — Discharge Instructions (Addendum)
Your ultrasound and xray are negative for blood clot or fracture. Please follow up with your doctor for further evaluation of the pain.

## 2023-05-02 ENCOUNTER — Ambulatory Visit
Admission: RE | Admit: 2023-05-02 | Discharge: 2023-05-02 | Disposition: A | Discharge status: Home / Self Care | Payer: PPO | Source: Ambulatory Visit | Attending: Thoracic Surgery (Cardiothoracic Vascular Surgery) | Admitting: Thoracic Surgery (Cardiothoracic Vascular Surgery)

## 2023-05-02 ENCOUNTER — Ambulatory Visit: Payer: PPO | Admitting: Physical Therapy

## 2023-05-02 DIAGNOSIS — I7121 Aneurysm of the ascending aorta, without rupture: Secondary | ICD-10-CM | POA: Diagnosis not present

## 2023-05-02 DIAGNOSIS — I700 Atherosclerosis of aorta: Secondary | ICD-10-CM | POA: Diagnosis not present

## 2023-05-02 DIAGNOSIS — I517 Cardiomegaly: Secondary | ICD-10-CM | POA: Diagnosis not present

## 2023-05-02 DIAGNOSIS — K122 Cellulitis and abscess of mouth: Secondary | ICD-10-CM | POA: Diagnosis not present

## 2023-05-02 DIAGNOSIS — I2510 Atherosclerotic heart disease of native coronary artery without angina pectoris: Secondary | ICD-10-CM | POA: Diagnosis not present

## 2023-05-02 DIAGNOSIS — I719 Aortic aneurysm of unspecified site, without rupture: Secondary | ICD-10-CM | POA: Diagnosis not present

## 2023-05-02 MED ORDER — IOHEXOL 350 MG/ML SOLN
75.0000 mL | Freq: Once | INTRAVENOUS | Status: AC | PRN
  Administered 2023-05-02: 75 mL via INTRAVENOUS

## 2023-05-03 DIAGNOSIS — M6281 Muscle weakness (generalized): Secondary | ICD-10-CM | POA: Diagnosis not present

## 2023-05-03 DIAGNOSIS — C9000 Multiple myeloma not having achieved remission: Secondary | ICD-10-CM | POA: Diagnosis not present

## 2023-05-03 DIAGNOSIS — R2689 Other abnormalities of gait and mobility: Secondary | ICD-10-CM | POA: Diagnosis not present

## 2023-05-03 DIAGNOSIS — S32591D Other specified fracture of right pubis, subsequent encounter for fracture with routine healing: Secondary | ICD-10-CM | POA: Diagnosis not present

## 2023-05-03 DIAGNOSIS — J9601 Acute respiratory failure with hypoxia: Secondary | ICD-10-CM | POA: Diagnosis not present

## 2023-05-03 LAB — LAB REPORT - SCANNED: EGFR: 81

## 2023-05-04 ENCOUNTER — Ambulatory Visit: Payer: PPO | Admitting: Physical Therapy

## 2023-05-05 ENCOUNTER — Ambulatory Visit: Payer: PPO | Admitting: Infectious Diseases

## 2023-05-05 ENCOUNTER — Encounter: Payer: Self-pay | Admitting: Infectious Diseases

## 2023-05-05 VITALS — BP 154/100 | HR 68 | Temp 98.2°F | Ht 67.0 in | Wt 176.0 lb

## 2023-05-05 DIAGNOSIS — M272 Inflammatory conditions of jaws: Secondary | ICD-10-CM | POA: Insufficient documentation

## 2023-05-05 DIAGNOSIS — I480 Paroxysmal atrial fibrillation: Secondary | ICD-10-CM | POA: Diagnosis not present

## 2023-05-05 DIAGNOSIS — L0201 Cutaneous abscess of face: Secondary | ICD-10-CM | POA: Diagnosis not present

## 2023-05-05 DIAGNOSIS — Z7901 Long term (current) use of anticoagulants: Secondary | ICD-10-CM | POA: Insufficient documentation

## 2023-05-05 DIAGNOSIS — I129 Hypertensive chronic kidney disease with stage 1 through stage 4 chronic kidney disease, or unspecified chronic kidney disease: Secondary | ICD-10-CM | POA: Diagnosis not present

## 2023-05-05 DIAGNOSIS — M8668 Other chronic osteomyelitis, other site: Secondary | ICD-10-CM | POA: Insufficient documentation

## 2023-05-05 DIAGNOSIS — C9000 Multiple myeloma not having achieved remission: Secondary | ICD-10-CM | POA: Insufficient documentation

## 2023-05-05 DIAGNOSIS — N183 Chronic kidney disease, stage 3 unspecified: Secondary | ICD-10-CM | POA: Diagnosis not present

## 2023-05-05 DIAGNOSIS — R7881 Bacteremia: Secondary | ICD-10-CM | POA: Insufficient documentation

## 2023-05-05 DIAGNOSIS — B9689 Other specified bacterial agents as the cause of diseases classified elsewhere: Secondary | ICD-10-CM | POA: Diagnosis not present

## 2023-05-05 MED ORDER — AMOXICILLIN-POT CLAVULANATE 875-125 MG PO TABS: 1.0000 | ORAL_TABLET | Freq: Two times a day (BID) | ORAL | 1 refills | Status: DC

## 2023-05-05 NOTE — Progress Notes (Signed)
NAME: Jack [name]  DOB: 1932/09/19  MRN: [id number]  Date/Time: 05/05/2023 11:17 AM   Subjective:   ? Jack [name] is a 89 y.o. 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 The collection was not extending to the floor of the mouth. No breach of the mylohyoid was evident. Pt had actinomyces neuii in blood culture . Underwent aspiration of the submandibular abscess and it was peptostreptococcus. He was discharged home on IV ceftriaxone and po metronidazole until 05/11/23 He is doing well Pain and swelling almost resolved Wife doing iv antibiotic No fever or chills   Past Medical History:  Diagnosis Date   Ascending aortic aneurysm (HCC)    a. 12/2020 Echo: Asc Ao 48mm, Ao root 40mm. b. 03/2022 Asc Ao 4.6 cm (4.5 cm in 2019)   CKD (chronic kidney disease), stage III (HCC)    Compression fracture of body of thoracic vertebra (HCC)    Diastolic dysfunction    a. 12/2020 Echo: EF 50-55%, no rwma, mild LVH, GrI DD, nl RV fxn, mild AI. Asc Ao 48mm, Ao root 40mm.   Elevated prostate specific antigen (PSA)    has been 7 for a year    GERD (gastroesophageal reflux disease)    History of colon polyps 2008   [facility],    History of kidney stones    Hyperlipidemia    Hypertension    Myeloma (HCC)    PAF (paroxysmal atrial fibrillation) (HCC)    a. 01/2021-->Eliquis (CHA2DS2VASc = 3-4).   Pain    Prostate hypertrophy    diagnosed at age 54 due to hematospermia    Past Surgical History:  Procedure Laterality Date   CATARACT EXTRACTION  W/PHACO Left 01/10/2018   Procedure: CATARACT EXTRACTION PHACO AND INTRAOCULAR LENS PLACEMENT (IOC);  Surgeon: Galen Manila, MD;  Location: [facility];  Service: Ophthalmology;  Laterality: Left;  [name] 00:24.8 AP% 14.9 CDE 3.68 Fluid pack lot # [id number]   CATARACT EXTRACTION W/PHACO Right 01/25/2018   Procedure: CATARACT EXTRACTION PHACO AND INTRAOCULAR LENS PLACEMENT (IOC);  Surgeon: Galen Manila, MD;  Location: [facility];  Service: Ophthalmology;  Laterality: Right;  [name] 00:42 AP% 10.8 CDE 4.59 Fluid pack lot # [id number]   COLON SURGERY     CYSTOSCOPY W/ URETERAL STENT PLACEMENT Right 10/16/2017   Procedure: right  URETERAL STENT PLACEMENT,cystoscopy bilateral stent removal,rretrograde;  Surgeon: Riki Altes, MD;  Location: [facility];  Service: Urology;  Laterality: Right;   CYSTOSCOPY/URETEROSCOPY/HOLMIUM LASER/STENT PLACEMENT Right 12/16/2020   Procedure: CYSTOSCOPY/URETEROSCOPY/HOLMIUM LASER/STENT PLACEMENT;  Surgeon: Riki Altes, MD;  Location: [facility];  Service: Urology;  Laterality: Right;   EXTRACORPOREAL SHOCK WAVE LITHOTRIPSY Right 12/11/2020   Procedure: EXTRACORPOREAL SHOCK WAVE LITHOTRIPSY (ESWL);  Surgeon: Riki Altes, MD;  Location: [facility];  Service: Urology;  Laterality: Right;   IR BONE TUMOR(S)RF ABLATION  04/16/2022   IR KYPHO EA ADDL LEVEL THORACIC OR LUMBAR  02/02/2021   IR KYPHO LUMBAR INC FX REDUCE BONE BX UNI/BIL CANNULATION INC/IMAGING  02/02/2021   IR KYPHO THORACIC WITH BONE BIOPSY  04/15/2022   KIDNEY STONE SURGERY  KYPHOPLASTY N/A 03/12/2021   Procedure: KYPHOPLASTY, Mosetta Putt, L3;  Surgeon: Kennedy Bucker, MD;  Location: [facility];  Service: Orthopedics;  Laterality: N/A;   RESECTION SOFT TISSUE TUMOR LEG / ANKLE RADICAL  jan 2009   [facility],  right thigh/knee , nonmalignant   SMALL INTESTINE SURGERY  1946   implaed on picket fence, punctured stomach   TONSILLECTOMY      Social History   Socioeconomic History   Marital status: Married    Spouse name:  Paramedic   Number of children: 4   Years of education: 16   Highest education level: Not on file  Occupational History   Occupation: Retired    Associate Professor: retired    Comment: Real Estate   Tobacco Use   Smoking status: Former    Current packs/day: 0.00    Types: Cigarettes    Quit date: 07/05/1965    Years since quitting: 57.8   Smokeless tobacco: Current   Tobacco comments:    Occasional cigar  Vaping Use   Vaping status: Never Used  Substance and Sexual Activity   Alcohol use: Not Currently    Alcohol/week: 1.0 standard drink of alcohol    Types: 1 Standard drinks or equivalent per week    Comment: occassionaly   Drug use: No   Sexual activity: Yes    Birth control/protection: None  Other Topics Concern   Not on file  Social History Narrative   Not on file   Social Determinants of Health   Financial Resource Strain: Low Risk  (01/03/2023)   [facility]   Overall Financial Resource Strain (CARDIA)    Difficulty of Paying Living Expenses: Not hard at all  Food Insecurity: No Food Insecurity (04/03/2023)   Hunger Vital Sign    Worried About Running Out of Food in the Last Year: Never true    Ran Out of Food in the Last Year: Never true  Transportation Needs: No Transportation Needs (04/03/2023)   PRAPARE - Administrator, Civil Service (Medical): No    Lack of Transportation (Non-Medical): No  Physical Activity: Not on file  Stress: Not on file  Social Connections: Not on file  Intimate Partner Violence: Not At Risk (04/03/2023)   Humiliation, Afraid, Rape, and Kick questionnaire    Fear of Current or Ex-Partner: No    Emotionally Abused: No    Physically Abused: No    Sexually Abused: No    Family History  Problem Relation Age of Onset   Hypertension Father    Hyperlipidemia Father    Cancer Sister        thyroid - dx in late 20's   Allergies  Allergen Reactions   Quinolones      Aortic dilation contraindicates FQ   Amlodipine Swelling and Other (See Comments)    LE edema   Azithromycin Nausea And Vomiting   Lipitor [Atorvastatin]     Muscle pain in RT Leg    Lisinopril Cough   Zetia [Ezetimibe]     Pain in legs    Hydromorphone Hcl     Wife states "Medication makes extremely sedated and lethargic"   I? Current Outpatient Medications  Medication Sig Dispense Refill   apixaban (ELIQUIS) 5 MG TABS tablet Take 1 tablet (5 mg total) by mouth 2 (two) times daily. 180 tablet 0   Ascorbic Acid (VITAMIN C) 1000 MG tablet Take 1,000 mg by mouth in the morning and at bedtime.  Calcium Carb-Cholecalciferol (OYSTER SHELL CALCIUM W/D) 500-5 MG-MCG TABS Take 1 tablet by mouth in the morning and at bedtime.     carvedilol (COREG) 12.5 MG tablet Take 0.5 tablets (6.25 mg total) by mouth 2 (two) times daily. 180 tablet 3   cefTRIAXone (ROCEPHIN) IVPB Inject 2 g into the vein daily. 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 First Dose: Yes Last Day of Therapy:  05/11/2023 Labs - Once weekly:  CBC/D, CMP, ESR and CRP Fax weekly lab results  promptly to [phone] Please leave PIC in place until doctor has seen patient or been notified Method of administration: IV Push Method of administration may be changed at the discretion of home infusion pharmacist based upon assessment of the patient and/or caregiver's ability to self-administer the medication ordered. 35 Units 0   dexamethasone (DECADRON) 4 MG tablet TAKE 5 TABLETS BY MOUTH ONE TIME PER WEEK (Patient taking differently: Take 20 mg by mouth once a week.) 20 tablet 4   feeding supplement (ENSURE ENLIVE / ENSURE PLUS) LIQD Take 237 mLs by mouth 3 (three) times daily between meals. (Patient taking differently: Take 237 mLs by mouth daily with lunch.) 237 mL 12   gabapentin (NEURONTIN) 300 MG capsule Take 2 capsules (600 mg total) by mouth 3 (three) times daily. 180 capsule 2    ibuprofen (ADVIL) 200 MG tablet Take 200 mg by mouth 2 (two) times daily as needed.     lidocaine (XYLOCAINE) 5 % ointment Apply topically 3 (three) times daily as needed for mild pain or moderate pain. (Patient taking differently: Apply 1 Application topically 3 (three) times daily as needed for mild pain or moderate pain. Apply to back) 50 g 2   metroNIDAZOLE (FLAGYL) 500 MG tablet Take 1 tablet (500 mg total) by mouth every 12 (twelve) hours. 70 tablet 0   morphine (MS CONTIN) 30 MG 12 hr tablet Take 1 tablet (30 mg total) by mouth every 8 (eight) hours. 90 tablet 0   Multiple Vitamin (MULTIVITAMIN WITH MINERALS) TABS tablet Take 1 tablet by mouth daily.     Multiple Vitamins-Minerals (PRESERVISION AREDS PO) Take 1 capsule by mouth in the morning and at bedtime.     naloxone (NARCAN) nasal spray 4 mg/0.1 mL SPRAY 1 SPRAY INTO ONE NOSTRIL AS DIRECTED FOR OPIOID OVERDOSE (TURN PERSON ON SIDE AFTER DOSE. IF NO RESPONSE IN 2-3 MINUTES OR PERSON RESPONDS BUT RELAPSES, REPEAT USING A NEW SPRAY DEVICE AND SPRAY INTO THE OTHER NOSTRIL. CALL 911 AFTER USE.) * EMERGENCY USE ONLY * 1 each 0   omeprazole (PRILOSEC) 20 MG capsule Take 20 mg by mouth daily as needed (takes when taking ibuprofen).     Oxycodone HCl 10 MG TABS Take 1 tablet (10 mg total) by mouth every 4 (four) hours as needed (pain). 90 tablet 0   polyethylene glycol (MIRALAX / GLYCOLAX) 17 g packet Take 17 g by mouth 2 (two) times daily.  0   potassium citrate (UROCIT-K) 10 MEQ (1080 MG) SR tablet TAKE 1 TABLET (10 MEQ TOTAL) BY MOUTH 3 (THREE) TIMES DAILY WITH MEALS. 270 tablet 2   senna-docusate (SENOKOT-S) 8.6-50 MG tablet Take 1 tablet by mouth 2 (two) times daily. 30 tablet 0   No current facility-administered medications for this visit.     Abtx:  Anti-infectives (From admission, onward)    None       REVIEW OF SYSTEMS:  Const: negative fever, negative chills, negative weight loss Eyes: negative diplopia or visual changes,  negative eye pain ENT: negative coryza, negative sore throat Resp: negative cough, hemoptysis, dyspnea Cards: negative for chest pain, palpitations, lower extremity edema GU: negative for frequency, dysuria and hematuria GI: Negative for abdominal pain, diarrhea, bleeding, constipation Skin: negative for rash and pruritus Heme: negative for easy bruising and gum/nose bleeding MS:  back pain  Neurolo:negative for headaches, dizziness, vertigo, memory problems  Psych: negative for feelings of anxiety, depression  Endocrine: negative for thyroid, diabetes Allergy/Immunology- negative for any medication or food allergies ? Pertinent Positives include : Objective:  VITALS:  BP (!) 154/100   Pulse 68   Temp 98.2 F (36.8 C) (Temporal)   Ht 5\' 7"  (1.702 m)   Wt 176 lb (79.8 kg)   SpO2 94%   BMI 27.57 kg/m   PHYSICAL EXAM:  General: Alert, cooperative, no distress, appears stated age.  Head: Normocephalic, without obvious abnormality, atraumatic. Eyes: Conjunctivae clear, anicteric sclerae. Pupils are equal ENT Nares normal. No drainage or sinus tenderness. Lips, mucosa, and tongue normal. No Thrush Neck: Supple, symmetrical, small swelling in the submental area Back: No CVA tenderness. Lungs: Clear to auscultation bilaterally. No Wheezing or Rhonchi. No rales. Heart: Regular rate and rhythm, no murmur, rub or gallop. Abdomen: Soft, non-tender,not distended. Bowel sounds normal. No masses Extremities: atraumatic, no cyanosis. No edema. No clubbing Skin: No rashes or lesions. Or bruising Lymph: Cervical, supraclavicular normal. Neurologic: Grossly non-focal Pertinent Labs Lab Results CBC    Component Value Date/Time   WBC 4.2 05/01/2023 0912   RBC 3.15 (L) 05/01/2023 0912   HGB 10.5 (L) 05/01/2023 0912   HGB 11.0 (L) 02/24/2021 1014   HCT 33.1 (L) 05/01/2023 0912   HCT 35.3 (L) 02/24/2021 1014   PLT 46 (L) 05/01/2023 0912   PLT 249 02/24/2021 1014   MCV 105.1 (H)  05/01/2023 0912   MCV 96 02/24/2021 1014   MCV 90 01/24/2013 0819   MCH 33.3 05/01/2023 0912   MCHC 31.7 05/01/2023 0912   RDW 19.0 (H) 05/01/2023 0912   RDW 15.7 (H) 02/24/2021 1014   RDW 12.9 01/24/2013 0819   LYMPHSABS 1.0 05/01/2023 0912   LYMPHSABS 1.2 01/24/2013 0819   MONOABS 0.6 05/01/2023 0912   MONOABS 0.3 01/24/2013 0819   EOSABS 0.1 05/01/2023 0912   EOSABS 0.0 01/24/2013 0819   BASOSABS 0.0 05/01/2023 0912   BASOSABS 0.0 01/24/2013 0819       Latest Ref Rng & Units 05/01/2023    9:12 AM 04/06/2023    4:14 AM 04/05/2023    5:41 AM  CMP  Glucose 70 - 99 mg/dL 932  671    BUN 8 - 23 mg/dL 21  35    Creatinine 2.45 - 1.24 mg/dL 8.09  9.83  3.82   Sodium 135 - 145 mmol/L 138  135    Potassium 3.5 - 5.1 mmol/L 3.9  4.0    Chloride 98 - 111 mmol/L 103  113    CO2 22 - 32 mmol/L 26  18    Calcium 8.9 - 10.3 mg/dL 8.5  7.5    Total Protein 6.5 - 8.1 g/dL 5.5     Total Bilirubin 0.3 - 1.2 mg/dL 0.6     Alkaline Phos 38 - 126 U/L 47     AST 15 - 41 U/L 16     ALT 0 - 44 U/L 7      ESR 29  CRP < 0.5?  Impression/Recommendation ?Submental abscess.  There was no evidence of osteomyelitis of the  mandibular bone on imaging But with actinomyces in the blood it raises the concern for a dental source and possible mandibular bone involvement. Culture of the abscess was peptostreptococcus So decided to treat like osteo with 4 weeks of iV ceftriaoxne + flagyl followed by Po augmentin Pt is here for follow up Doing very well He will complete IV on 05/11/23 ESR is much better at 29  Actinomyces bacteremia. On ceftriaxone- after he completes Iv on 8/28 will start Augmentin for 2 more months   ? Kappa chain myeloma On Revlimid- on hold   Hypertension   AKI on CKD- improved ? ? ___________________________________________________ Discussed with patient, and wife Will discuss with Dr.Finnegan Note:  This document was prepared using Dragon voice recognition software and may  include unintentional dictation errors.

## 2023-05-05 NOTE — Patient Instructions (Signed)
You are here for follow up of abscess and possible bone infection. You will complete iv on 8/28 and will start augmentin 875mg  po bid from 8/29 for 2 months. Will follow in 6 weeks

## 2023-05-07 DIAGNOSIS — R2689 Other abnormalities of gait and mobility: Secondary | ICD-10-CM | POA: Diagnosis not present

## 2023-05-07 DIAGNOSIS — J9601 Acute respiratory failure with hypoxia: Secondary | ICD-10-CM | POA: Diagnosis not present

## 2023-05-07 DIAGNOSIS — S32592D Other specified fracture of left pubis, subsequent encounter for fracture with routine healing: Secondary | ICD-10-CM | POA: Diagnosis not present

## 2023-05-07 DIAGNOSIS — C9000 Multiple myeloma not having achieved remission: Secondary | ICD-10-CM | POA: Diagnosis not present

## 2023-05-07 DIAGNOSIS — M6281 Muscle weakness (generalized): Secondary | ICD-10-CM | POA: Diagnosis not present

## 2023-05-08 DIAGNOSIS — K122 Cellulitis and abscess of mouth: Secondary | ICD-10-CM | POA: Diagnosis not present

## 2023-05-09 ENCOUNTER — Telehealth: Payer: Self-pay

## 2023-05-09 ENCOUNTER — Ambulatory Visit: Payer: PPO | Admitting: Physical Therapy

## 2023-05-09 NOTE — Telephone Encounter (Signed)
Transition Care Management Follow-up Telephone Call Date of discharge and from where: [place] 8/18 How have you been since you were released from the hospital? Doing fine and following up wit PCP Any questions or concerns? No  Items Reviewed: Did the pt receive and understand the discharge instructions provided? Yes  Medications obtained and verified? No  Other? No  Any new allergies since your discharge? No  Dietary orders reviewed? No Do you have support at home? Yes     Follow up appointments reviewed:  PCP Hospital f/u appt confirmed? Yes  Scheduled to see  on  @ . Specialist Hospital f/u appt confirmed? No  Scheduled to see  on  @ . Are transportation arrangements needed? No  If their condition worsens, is the pt aware to call PCP or go to the Emergency Dept.? Yes Was the patient provided with contact information for the PCP's office or ED? Yes Was to pt encouraged to call back with questions or concerns? Yes

## 2023-05-10 ENCOUNTER — Ambulatory Visit: Payer: PPO | Admitting: Thoracic Surgery (Cardiothoracic Vascular Surgery)

## 2023-05-10 DIAGNOSIS — H43812 Vitreous degeneration, left eye: Secondary | ICD-10-CM | POA: Diagnosis not present

## 2023-05-10 DIAGNOSIS — H353231 Exudative age-related macular degeneration, bilateral, with active choroidal neovascularization: Secondary | ICD-10-CM | POA: Diagnosis not present

## 2023-05-10 DIAGNOSIS — Z961 Presence of intraocular lens: Secondary | ICD-10-CM | POA: Diagnosis not present

## 2023-05-10 DIAGNOSIS — H35033 Hypertensive retinopathy, bilateral: Secondary | ICD-10-CM | POA: Diagnosis not present

## 2023-05-11 ENCOUNTER — Ambulatory Visit: Payer: PPO | Admitting: Physical Therapy

## 2023-05-11 ENCOUNTER — Inpatient Hospital Stay (HOSPITAL_BASED_OUTPATIENT_CLINIC_OR_DEPARTMENT_OTHER): Payer: PPO | Admitting: Hospice and Palliative Medicine

## 2023-05-11 DIAGNOSIS — Z515 Encounter for palliative care: Secondary | ICD-10-CM | POA: Diagnosis not present

## 2023-05-11 DIAGNOSIS — C9000 Multiple myeloma not having achieved remission: Secondary | ICD-10-CM

## 2023-05-11 MED ORDER — DULOXETINE HCL 20 MG PO CPEP: 20.0000 mg | ORAL_CAPSULE | Freq: Every day | ORAL | 3 refills | Status: DC

## 2023-05-11 NOTE — Progress Notes (Signed)
Virtual Visit via Telephone Note  I connected with Jack [name] on 05/11/23 at  2:00 PM EDT by telephone and verified that I am speaking with the correct person using two identifiers.  Location: Patient: Home Provider: Clinic   I discussed the limitations, risks, security and privacy concerns of performing an evaluation and management service by telephone and the availability of in person appointments. I also discussed with the patient that there may be a patient responsible charge related to this service. The patient expressed understanding and agreed to proceed.   History of Present Illness: Jack [name] is a 89 y.o. male with multiple medical problems including hypertension, CKD stage IIIb, A. fib on Eliquis, and anemia.  Patient was hospitalized 01/02/2021 to 01/07/2021 with T12 compression fracture with recommendation for conservative management including use of a TLSO brace.  Patient was hospitalized again 01/29/2021 to 02/07/2021 with recurrent back pain and MR showing T12 and new L1 compression fracture.  Patient underwent T12/L1 kyphoplasty on 02/02/2021.  There was also suspicion of a left fifth rib fracture, which occurred while transitioning from the stretcher for his kyphoplasty.  Patient had mild hypercalcemia with SPEP and UPEP concerning for possible myeloma.  Patient was referred to [facility] for work-up.  He was readmitted 03/11/2021 - 03/17/2021 with worsening back pain.  CT revealed new compression fractures of L2 and L3.  Patient underwent kyphoplasty on 6/30.  He was hospitalized again 03/26/2021-04/07/2021 with severe back/flank pain.  MRI revealed T10 pathologic compression fracture.  He was started on treatment for multiple myeloma with dexamethasone/Velcade and XRT to spine.   Patient is s/p T11 kyphoplasty and ablation.    Observations/Objective: Spoke with patient and wife by phone.    Patient hospitalized last month with dental abscess/sepsis.  Underwent  aspiration of submandibular abscess and found to have Peptostreptococcus.  He went home on IV ceftriaxone and p.o. metronidazole, which patient completed regimen today.  He saw ID last week with plan for 2 additional months of oral antibiotics.  Symptomatically, he is doing about the same.  Continues to use MS Contin and oxycodone.  He asked about reducing frequency of MS Contin back to every 12 hours but he continues using oxycodone every 4 hours.  He does have planned follow-up in the future with pain management.  Denies any adverse effects from pain meds.  Patient does endorse poorly controlled neuropathy despite taking gabapentin.  He asked about additional medications to manage neuropathy/neuropathic pain.  Will add duloxetine.  Assessment and Plan: Chronic back pain secondary to multiple myeloma and history of recurrent/chronic compression fractures.  S/p XRT.  Continue MS Contin/oxycodone. Patient plans to follow up with pain management after completion of abx regimen.   Neuropathy -Continue gabapentin. Start low doseduloxetine.  Follow Up Instructions: Follow-up telephone visit 1 to 2 months   I discussed the assessment and treatment plan with the patient. The patient was provided an opportunity to ask questions and all were answered. The patient agreed with the plan and demonstrated an understanding of the instructions.   The patient was advised to call back or seek an in-person evaluation if the symptoms worsen or if the condition fails to improve as anticipated.  I provided 15 minutes of non-face-to-face time during this encounter.   Malachy Moan, NP

## 2023-05-16 ENCOUNTER — Other Ambulatory Visit: Payer: Self-pay | Admitting: Urology

## 2023-05-17 ENCOUNTER — Ambulatory Visit (INDEPENDENT_AMBULATORY_CARE_PROVIDER_SITE_OTHER): Payer: PPO | Admitting: Thoracic Surgery (Cardiothoracic Vascular Surgery)

## 2023-05-17 DIAGNOSIS — N1832 Chronic kidney disease, stage 3b: Secondary | ICD-10-CM | POA: Diagnosis not present

## 2023-05-17 DIAGNOSIS — M25551 Pain in right hip: Secondary | ICD-10-CM | POA: Diagnosis not present

## 2023-05-17 DIAGNOSIS — C9000 Multiple myeloma not having achieved remission: Secondary | ICD-10-CM | POA: Diagnosis not present

## 2023-05-17 DIAGNOSIS — I4891 Unspecified atrial fibrillation: Secondary | ICD-10-CM | POA: Diagnosis not present

## 2023-05-17 DIAGNOSIS — H353290 Exudative age-related macular degeneration, unspecified eye, stage unspecified: Secondary | ICD-10-CM | POA: Diagnosis not present

## 2023-05-17 DIAGNOSIS — R7881 Bacteremia: Secondary | ICD-10-CM | POA: Diagnosis not present

## 2023-05-17 DIAGNOSIS — M545 Low back pain, unspecified: Secondary | ICD-10-CM | POA: Diagnosis not present

## 2023-05-17 DIAGNOSIS — G893 Neoplasm related pain (acute) (chronic): Secondary | ICD-10-CM | POA: Diagnosis not present

## 2023-05-17 DIAGNOSIS — I129 Hypertensive chronic kidney disease with stage 1 through stage 4 chronic kidney disease, or unspecified chronic kidney disease: Secondary | ICD-10-CM | POA: Diagnosis not present

## 2023-05-17 DIAGNOSIS — M25552 Pain in left hip: Secondary | ICD-10-CM | POA: Diagnosis not present

## 2023-05-17 DIAGNOSIS — Z515 Encounter for palliative care: Secondary | ICD-10-CM | POA: Diagnosis not present

## 2023-05-17 DIAGNOSIS — I7121 Aneurysm of the ascending aorta, without rupture: Secondary | ICD-10-CM

## 2023-05-17 NOTE — Progress Notes (Signed)
[address]       [place] [address]             [phone]      This visit was conducted as a telephone visit at the patient's request.  Identity was confirmed with 2 identifiers.  Patient location: Home MD location: Office  HPI: Mr. Hartfiel is being monitored for an ascending aortic aneurysm.  Nebiyu Gerhart is an 89 year old man with a history of hypertension, hyperlipidemia, paroxysmal atrial fibrillation, ascending aneurysm, multiple myeloma, stage III chronic kidney disease, vertebral compression fractures, reflux, and BPH.  He was noted to have an ascending aneurysm on a CT of the chest in 2019.  I first saw him in 2023.  He had a 4.6 cm ascending aneurysm.  Unchanged from his previous scan.  He is followed by Dr. Orlie Dakin for his myeloma.  He had been on Xgeva and Revlimid in addition to dexamethasone for that.  He recently had a submandibular abscess which required drainage and hospitalization.  He currently is on long-term antibiotics for that.  No chest pain, pressure, or tightness.  Past Medical History:  Diagnosis Date   Ascending aortic aneurysm (HCC)    a. 12/2020 Echo: Asc Ao 48mm, Ao root 40mm. b. 03/2022 Asc Ao 4.6 cm (4.5 cm in 2019)   CKD (chronic kidney disease), stage III (HCC)    Compression fracture of body of thoracic vertebra (HCC)    Diastolic dysfunction    a. 12/2020 Echo: EF 50-55%, no rwma, mild LVH, GrI DD, nl RV fxn, mild AI. Asc Ao 48mm, Ao root 40mm.   Elevated prostate specific antigen (PSA)    has been 7 for a year    GERD (gastroesophageal reflux disease)    History of colon polyps 2008   [facility],    History of kidney stones    Hyperlipidemia    Hypertension    Myeloma (HCC)    PAF (paroxysmal atrial fibrillation) (HCC)    a. 01/2021-->Eliquis (CHA2DS2VASc = 3-4).   Pain    Prostate hypertrophy    diagnosed at age 77 due to hematospermia    Current Outpatient Medications  Medication Sig Dispense Refill    amoxicillin-clavulanate (AUGMENTIN) 875-125 MG tablet Take 1 tablet by mouth 2 (two) times daily. 60 tablet 1   apixaban (ELIQUIS) 5 MG TABS tablet Take 1 tablet (5 mg total) by mouth 2 (two) times daily. 180 tablet 0   Ascorbic Acid (VITAMIN C) 1000 MG tablet Take 1,000 mg by mouth in the morning and at bedtime.     Calcium Carb-Cholecalciferol (OYSTER SHELL CALCIUM W/D) 500-5 MG-MCG TABS Take 1 tablet by mouth in the morning and at bedtime.     carvedilol (COREG) 12.5 MG tablet Take 0.5 tablets (6.25 mg total) by mouth 2 (two) times daily. 180 tablet 3   dexamethasone (DECADRON) 4 MG tablet TAKE 5 TABLETS BY MOUTH ONE TIME PER WEEK (Patient taking differently: Take 20 mg by mouth once a week.) 20 tablet 4   DULoxetine (CYMBALTA) 20 MG capsule Take 1 capsule (20 mg total) by mouth daily. 30 capsule 3   feeding supplement (ENSURE ENLIVE / ENSURE PLUS) LIQD Take 237 mLs by mouth 3 (three) times daily between meals. (Patient taking differently: Take 237 mLs by mouth daily with lunch.) 237 mL 12   gabapentin (NEURONTIN) 300 MG capsule Take 2 capsules (600 mg total) by mouth 3 (three) times daily. 180 capsule 2   ibuprofen (ADVIL)  200 MG tablet Take 200 mg by mouth 2 (two) times daily as needed.     lidocaine (XYLOCAINE) 5 % ointment Apply topically 3 (three) times daily as needed for mild pain or moderate pain. (Patient taking differently: Apply 1 Application topically 3 (three) times daily as needed for mild pain or moderate pain. Apply to back) 50 g 2   morphine (MS CONTIN) 30 MG 12 hr tablet Take 1 tablet (30 mg total) by mouth every 8 (eight) hours. 90 tablet 0   Multiple Vitamin (MULTIVITAMIN WITH MINERALS) TABS tablet Take 1 tablet by mouth daily.     Multiple Vitamins-Minerals (PRESERVISION AREDS PO) Take 1 capsule by mouth in the morning and at bedtime.     naloxone (NARCAN) nasal spray 4 mg/0.1 mL SPRAY 1 SPRAY INTO ONE NOSTRIL AS DIRECTED FOR OPIOID OVERDOSE (TURN PERSON ON SIDE AFTER DOSE. IF  NO RESPONSE IN 2-3 MINUTES OR PERSON RESPONDS BUT RELAPSES, REPEAT USING A NEW SPRAY DEVICE AND SPRAY INTO THE OTHER NOSTRIL. CALL 911 AFTER USE.) * EMERGENCY USE ONLY * 1 each 0   omeprazole (PRILOSEC) 20 MG capsule Take 20 mg by mouth daily as needed (takes when taking ibuprofen).     Oxycodone HCl 10 MG TABS Take 1 tablet (10 mg total) by mouth every 4 (four) hours as needed (pain). 90 tablet 0   polyethylene glycol (MIRALAX / GLYCOLAX) 17 g packet Take 17 g by mouth 2 (two) times daily.  0   potassium citrate (UROCIT-K) 10 MEQ (1080 MG) SR tablet TAKE 1 TABLET (10 MEQ TOTAL) BY MOUTH 3 (THREE) TIMES DAILY WITH MEALS. 270 tablet 2   senna-docusate (SENOKOT-S) 8.6-50 MG tablet Take 1 tablet by mouth 2 (two) times daily. 30 tablet 0   No current facility-administered medications for this visit.    Physical Exam None  Diagnostic Tests: CT ANGIOGRAPHY CHEST WITH CONTRAST   TECHNIQUE: Multidetector CT imaging of the chest was performed using the standard protocol during bolus administration of intravenous contrast. Multiplanar CT image reconstructions and MIPs were obtained to evaluate the vascular anatomy.   RADIATION DOSE REDUCTION: This exam was performed according to the departmental dose-optimization program which includes automated exposure control, adjustment of the mA and/or kV according to patient size and/or use of iterative reconstruction technique.   CONTRAST:  75mL OMNIPAQUE IOHEXOL 350 MG/ML SOLN   COMPARISON:  03/22/2022   FINDINGS: Cardiovascular:   Heart:   Heart size borderline enlarged. No pericardial fluid/thickening. Calcifications of left main, left anterior descending, circumflex, right coronary arteries.   Aorta:   Minimal aortic valve calcifications.   Greatest estimated diameter of the aortic annulus 26 mm on the coronal reformatted images.   Greatest estimated diameter of the sino-tubular junction, 34 mm on the coronal images.   Greatest  estimated diameter of the ascending aorta on the axial images, 47 mm.   Mild atherosclerotic changes of the aorta. Branch vessels are patent with a 3 vessel arch. Cervical cerebral vessels patent at the base of the neck.   No pedunculated plaque, ulcerated plaque, dissection, periaortic fluid. No wall thickening.   Pulmonary arteries:   Timing of the contrast bolus is not optimized for evaluation of pulmonary artery filling defects. Unremarkable size of the main pulmonary artery.   Mediastinum/Nodes: Hiatal hernia. No adenopathy. Unremarkable central airways. Unremarkable thoracic inlet.   Unremarkable appearance of the thoracic esophagus.   Lungs/Pleura: Central airways are clear. No pleural effusion. No confluent airspace disease.   No pneumothorax.  Upper Abdomen: No acute finding of the upper abdomen.   Right upper extremity PICC   Musculoskeletal: Vertebral augmentation of T11, T12, L1. No displaced fracture. No bony canal narrowing. Osteopenia.   Review of the MIP images confirms the above findings.   IMPRESSION: Unchanged size and configuration of the ascending aorta, estimated 4.7 cm diameter on the current CT. Aortic aneurysm NOS (ICD10-I71.9).   Aortic atherosclerosis and coronary artery disease. Aortic Atherosclerosis (ICD10-I70.0).   Additional ancillary findings as above.   Signed,   Yvone Neu. Miachel Roux, RPVI   [facility]   [facility]     Electronically Signed   By: Gilmer Mor D.O.   On: 05/02/2023 15:20 I personally reviewed the CT images.  Stable 4.7 cm ascending aneurysm.  Aortic and coronary atherosclerosis.  Impression: Gent Rack is an 89 year old man with a history of hypertension, hyperlipidemia, paroxysmal atrial fibrillation, ascending aneurysm, multiple myeloma, stage III chronic kidney disease, vertebral compression fractures, reflux, and BPH.  He was noted to have an  ascending aneurysm on a CT of the chest in 2019.  I first saw him in 2023.  He had a 4.6 cm ascending aneurysm.  Unchanged from his previous scan.  Ascending thoracic aneurysm-relatively stable.  Measured about 4.7 cm on today's exam.  Based on size guidelines recommend every 71-month follow-up.  I again discussed that with Mr. and Mrs. Bahena.  They want to continue with annual follow-up.  Again it is unlikely he would ever be at candidate for surgery but he would like to be monitored.  Plan: CT angio chest in 1 year to follow-up ascending aneurysm  I spent 10 minutes on the phone with Mr. Doney and 20 minutes on review of records images and the consultation until today. Loreli Slot, MD [facility] [phone]

## 2023-05-18 ENCOUNTER — Ambulatory Visit: Payer: PPO | Admitting: Physical Therapy

## 2023-05-19 ENCOUNTER — Other Ambulatory Visit: Payer: Self-pay | Admitting: Oncology

## 2023-05-19 DIAGNOSIS — C9000 Multiple myeloma not having achieved remission: Secondary | ICD-10-CM

## 2023-05-23 ENCOUNTER — Inpatient Hospital Stay: Payer: PPO

## 2023-05-23 ENCOUNTER — Inpatient Hospital Stay: Payer: PPO | Attending: Oncology | Admitting: Oncology

## 2023-05-23 ENCOUNTER — Encounter: Payer: Self-pay | Admitting: Oncology

## 2023-05-23 ENCOUNTER — Ambulatory Visit: Payer: PPO | Admitting: Physical Therapy

## 2023-05-23 VITALS — BP 145/93 | HR 66 | Temp 98.5°F | Resp 16 | Ht 67.0 in | Wt 178.0 lb

## 2023-05-23 DIAGNOSIS — D485 Neoplasm of uncertain behavior of skin: Secondary | ICD-10-CM | POA: Diagnosis not present

## 2023-05-23 DIAGNOSIS — D649 Anemia, unspecified: Secondary | ICD-10-CM | POA: Diagnosis not present

## 2023-05-23 DIAGNOSIS — M899 Disorder of bone, unspecified: Secondary | ICD-10-CM | POA: Diagnosis not present

## 2023-05-23 DIAGNOSIS — Z79899 Other long term (current) drug therapy: Secondary | ICD-10-CM | POA: Insufficient documentation

## 2023-05-23 DIAGNOSIS — M8580 Other specified disorders of bone density and structure, unspecified site: Secondary | ICD-10-CM | POA: Insufficient documentation

## 2023-05-23 DIAGNOSIS — C9000 Multiple myeloma not having achieved remission: Secondary | ICD-10-CM | POA: Diagnosis not present

## 2023-05-23 DIAGNOSIS — Z87891 Personal history of nicotine dependence: Secondary | ICD-10-CM | POA: Insufficient documentation

## 2023-05-23 DIAGNOSIS — R52 Pain, unspecified: Secondary | ICD-10-CM | POA: Insufficient documentation

## 2023-05-23 DIAGNOSIS — C44329 Squamous cell carcinoma of skin of other parts of face: Secondary | ICD-10-CM | POA: Diagnosis not present

## 2023-05-23 LAB — CBC WITH DIFFERENTIAL/PLATELET
Abs Immature Granulocytes: 0.01 10*3/uL (ref 0.00–0.07)
Basophils Absolute: 0 10*3/uL (ref 0.0–0.1)
Basophils Relative: 0 %
Eosinophils Absolute: 0.1 10*3/uL (ref 0.0–0.5)
Eosinophils Relative: 2 %
HCT: 36.8 % — ABNORMAL LOW (ref 39.0–52.0)
Hemoglobin: 12.1 g/dL — ABNORMAL LOW (ref 13.0–17.0)
Immature Granulocytes: 0 %
Lymphocytes Relative: 35 %
Lymphs Abs: 1.4 10*3/uL (ref 0.7–4.0)
MCH: 34.7 pg — ABNORMAL HIGH (ref 26.0–34.0)
MCHC: 32.9 g/dL (ref 30.0–36.0)
MCV: 105.4 fL — ABNORMAL HIGH (ref 80.0–100.0)
Monocytes Absolute: 0.6 10*3/uL (ref 0.1–1.0)
Monocytes Relative: 16 %
Neutro Abs: 1.9 10*3/uL (ref 1.7–7.7)
Neutrophils Relative %: 47 %
Platelets: 67 10*3/uL — ABNORMAL LOW (ref 150–400)
RBC: 3.49 MIL/uL — ABNORMAL LOW (ref 4.22–5.81)
RDW: 16.5 % — ABNORMAL HIGH (ref 11.5–15.5)
WBC: 4 10*3/uL (ref 4.0–10.5)
nRBC: 0 % (ref 0.0–0.2)

## 2023-05-23 LAB — COMPREHENSIVE METABOLIC PANEL
ALT: 8 U/L (ref 0–44)
AST: 18 U/L (ref 15–41)
Albumin: 3.7 g/dL (ref 3.5–5.0)
Alkaline Phosphatase: 57 U/L (ref 38–126)
Anion gap: 5 (ref 5–15)
BUN: 22 mg/dL (ref 8–23)
CO2: 26 mmol/L (ref 22–32)
Calcium: 8.7 mg/dL — ABNORMAL LOW (ref 8.9–10.3)
Chloride: 105 mmol/L (ref 98–111)
Creatinine, Ser: 1.1 mg/dL (ref 0.61–1.24)
GFR, Estimated: >60 mL/min (ref >60)
Glucose, Bld: 123 mg/dL — ABNORMAL HIGH (ref 70–99)
Potassium: 4.2 mmol/L (ref 3.5–5.1)
Sodium: 136 mmol/L (ref 135–145)
Total Bilirubin: 0.5 mg/dL (ref 0.3–1.2)
Total Protein: 5.7 g/dL — ABNORMAL LOW (ref 6.5–8.1)

## 2023-05-23 NOTE — Progress Notes (Signed)
Concerned about leg and feet swelling x3 months.

## 2023-05-23 NOTE — Progress Notes (Signed)
[facility]  [phone] [phone]  ID: Jack [name] OB: [birth year]  MR#: [id number]  MWN#:[id number]  Patient Care Team: Dorothey Baseman, MD as PCP - General (Family Medicine) End, Cristal Deer, MD as PCP - Cardiology (Cardiology) Quentin Cornwall, MD (Endocrinology) Jeralyn Ruths, MD as Consulting Physician (Hematology and Oncology) Kemper Durie, RN as [facility] Care Management   CHIEF COMPLAINT: Stage II kappa chain myeloma.  INTERVAL HISTORY: Patient returns to clinic today for repeat laboratory work and further evaluation.  He has now completed his IV antibiotics and has been instructed to take Augmentin for 2 months.  He continues to have lower extremity edema as well as back pain.  Revlimid is currently on hold.  He has no neurologic complaints.  He denies any fevers.  He has a good appetite and denies weight loss.  He denies chest pain, shortness of breath, cough, or hemoptysis.  He denies any nausea, vomiting, constipation, or diarrhea.  He has no urinary complaints.  Patient offers no further specific complaints today.  REVIEW OF SYSTEMS:   Review of Systems  Constitutional: Negative.  Negative for fever and malaise/fatigue.  Respiratory: Negative.  Negative for cough, hemoptysis and shortness of breath.   Cardiovascular:  Positive for leg swelling. Negative for chest pain.  Gastrointestinal: Negative.  Negative for abdominal pain.  Genitourinary: Negative.  Negative for dysuria and flank pain.  Musculoskeletal:  Positive for back pain. Negative for falls and joint pain.  Skin: Negative.  Negative for rash.  Neurological: Negative.  Negative for dizziness, focal weakness, weakness and headaches.  Psychiatric/Behavioral: Negative.  The patient is not nervous/anxious.     As per HPI. Otherwise, a complete review of systems is negative.  PAST MEDICAL HISTORY: Past Medical History:  Diagnosis Date   Ascending  aortic aneurysm (HCC)    a. 12/2020 Echo: Asc Ao 48mm, Ao root 40mm. b. 03/2022 Asc Ao 4.6 cm (4.5 cm in 2019)   CKD (chronic kidney disease), stage III (HCC)    Compression fracture of body of thoracic vertebra (HCC)    Diastolic dysfunction    a. 12/2020 Echo: EF 50-55%, no rwma, mild LVH, GrI DD, nl RV fxn, mild AI. Asc Ao 48mm, Ao root 40mm.   Elevated prostate specific antigen (PSA)    has been 7 for a year    GERD (gastroesophageal reflux disease)    History of colon polyps 2008   [facility],    History of kidney stones    Hyperlipidemia    Hypertension    Myeloma (HCC)    PAF (paroxysmal atrial fibrillation) (HCC)    a. 01/2021-->Eliquis (CHA2DS2VASc = 3-4).   Pain    Prostate hypertrophy    diagnosed at age 67 due to hematospermia    PAST SURGICAL HISTORY: Past Surgical History:  Procedure Laterality Date   CATARACT EXTRACTION W/PHACO Left 01/10/2018   Procedure: CATARACT EXTRACTION PHACO AND INTRAOCULAR LENS PLACEMENT (IOC);  Surgeon: Galen Manila, MD;  Location: [facility] ORS;  Service: Ophthalmology;  Laterality: Left;  [name] 00:24.8 AP% 14.9 CDE 3.68 Fluid pack lot # [id number]   CATARACT EXTRACTION W/PHACO Right 01/25/2018   Procedure: CATARACT EXTRACTION PHACO AND INTRAOCULAR LENS PLACEMENT (IOC);  Surgeon: Galen Manila, MD;  Location: [facility] ORS;  Service: Ophthalmology;  Laterality: Right;  [name] 00:42 AP% 10.8 CDE 4.59 Fluid pack lot # [id number]   COLON SURGERY     CYSTOSCOPY W/ URETERAL STENT PLACEMENT Right 10/16/2017   Procedure: right  URETERAL  STENT PLACEMENT,cystoscopy bilateral stent removal,rretrograde;  Surgeon: Riki Altes, MD;  Location: [facility] ORS;  Service: Urology;  Laterality: Right;   CYSTOSCOPY/URETEROSCOPY/HOLMIUM LASER/STENT PLACEMENT Right 12/16/2020   Procedure: CYSTOSCOPY/URETEROSCOPY/HOLMIUM LASER/STENT PLACEMENT;  Surgeon: Riki Altes, MD;  Location: [facility] ORS;  Service: Urology;  Laterality: Right;   EXTRACORPOREAL SHOCK WAVE LITHOTRIPSY  Right 12/11/2020   Procedure: EXTRACORPOREAL SHOCK WAVE LITHOTRIPSY (ESWL);  Surgeon: Riki Altes, MD;  Location: [facility] ORS;  Service: Urology;  Laterality: Right;   IR BONE TUMOR(S)RF ABLATION  04/16/2022   IR KYPHO EA ADDL LEVEL THORACIC OR LUMBAR  02/02/2021   IR KYPHO LUMBAR INC FX REDUCE BONE BX UNI/BIL CANNULATION INC/IMAGING  02/02/2021   IR KYPHO THORACIC WITH BONE BIOPSY  04/15/2022   KIDNEY STONE SURGERY     KYPHOPLASTY N/A 03/12/2021   Procedure: Nicki Reaper, L3;  Surgeon: Kennedy Bucker, MD;  Location: [facility] ORS;  Service: Orthopedics;  Laterality: N/A;   RESECTION SOFT TISSUE TUMOR LEG / ANKLE RADICAL  jan 2009   [facility],  right thigh/knee , nonmalignant   SMALL INTESTINE SURGERY  1946   implaed on picket fence, punctured stomach   TONSILLECTOMY      FAMILY HISTORY: Family History  Problem Relation Age of Onset   Hypertension Father    Hyperlipidemia Father    Cancer Sister        thyroid - dx in late 20's    ADVANCED DIRECTIVES (Y/N):  N  HEALTH MAINTENANCE: Social History   Tobacco Use   Smoking status: Former    Current packs/day: 0.00    Types: Cigarettes    Quit date: 07/05/1965    Years since quitting: 57.9   Smokeless tobacco: Current   Tobacco comments:    Occasional cigar  Vaping Use   Vaping status: Never Used  Substance Use Topics   Alcohol use: Not Currently    Alcohol/week: 1.0 standard drink of alcohol    Types: 1 Standard drinks or equivalent per week    Comment: occassionaly   Drug use: No     Colonoscopy:  PAP:  Bone density:  Lipid panel:  Allergies  Allergen Reactions   Quinolones     Aortic dilation contraindicates FQ   Amlodipine Swelling and Other (See Comments)    LE edema   Azithromycin Nausea And Vomiting   Lipitor [Atorvastatin]     Muscle pain in RT Leg    Lisinopril Cough   Zetia [Ezetimibe]     Pain in legs    Hydromorphone Hcl     Wife states "Medication makes extremely sedated and lethargic"    Current  Outpatient Medications  Medication Sig Dispense Refill   amoxicillin-clavulanate (AUGMENTIN) 875-125 MG tablet Take 1 tablet by mouth 2 (two) times daily. 60 tablet 1   apixaban (ELIQUIS) 5 MG TABS tablet Take 1 tablet (5 mg total) by mouth 2 (two) times daily. 180 tablet 0   Ascorbic Acid (VITAMIN C) 1000 MG tablet Take 1,000 mg by mouth in the morning and at bedtime.     Calcium Carb-Cholecalciferol (OYSTER SHELL CALCIUM W/D) 500-5 MG-MCG TABS Take 1 tablet by mouth in the morning and at bedtime.     carvedilol (COREG) 12.5 MG tablet Take 0.5 tablets (6.25 mg total) by mouth 2 (two) times daily. 180 tablet 3   DULoxetine (CYMBALTA) 20 MG capsule Take 1 capsule (20 mg total) by mouth daily. 30 capsule 3   feeding supplement (ENSURE ENLIVE / ENSURE PLUS) LIQD Take 237 mLs by  mouth 3 (three) times daily between meals. (Patient taking differently: Take 237 mLs by mouth daily with lunch.) 237 mL 12   gabapentin (NEURONTIN) 300 MG capsule Take 2 capsules (600 mg total) by mouth 3 (three) times daily. 180 capsule 2   ibuprofen (ADVIL) 200 MG tablet Take 200 mg by mouth 2 (two) times daily as needed.     lidocaine (XYLOCAINE) 5 % ointment Apply topically 3 (three) times daily as needed for mild pain or moderate pain. (Patient taking differently: Apply 1 Application topically 3 (three) times daily as needed for mild pain or moderate pain. Apply to back) 50 g 2   morphine (MS CONTIN) 30 MG 12 hr tablet Take 1 tablet (30 mg total) by mouth every 8 (eight) hours. 90 tablet 0   Multiple Vitamin (MULTIVITAMIN WITH MINERALS) TABS tablet Take 1 tablet by mouth daily.     Multiple Vitamins-Minerals (PRESERVISION AREDS PO) Take 1 capsule by mouth in the morning and at bedtime.     naloxone (NARCAN) nasal spray 4 mg/0.1 mL SPRAY 1 SPRAY INTO ONE NOSTRIL AS DIRECTED FOR OPIOID OVERDOSE (TURN PERSON ON SIDE AFTER DOSE. IF NO RESPONSE IN 2-3 MINUTES OR PERSON RESPONDS BUT RELAPSES, REPEAT USING A NEW SPRAY DEVICE AND  SPRAY INTO THE OTHER NOSTRIL. CALL 911 AFTER USE.) * EMERGENCY USE ONLY * 1 each 0   omeprazole (PRILOSEC) 20 MG capsule Take 20 mg by mouth daily as needed (takes when taking ibuprofen).     Oxycodone HCl 10 MG TABS Take 1 tablet (10 mg total) by mouth every 4 (four) hours as needed (pain). 90 tablet 0   polyethylene glycol (MIRALAX / GLYCOLAX) 17 g packet Take 17 g by mouth 2 (two) times daily.  0   potassium citrate (UROCIT-K) 10 MEQ (1080 MG) SR tablet TAKE 1 TABLET (10 MEQ TOTAL) BY MOUTH 3 (THREE) TIMES DAILY WITH MEALS. 270 tablet 0   senna-docusate (SENOKOT-S) 8.6-50 MG tablet Take 1 tablet by mouth 2 (two) times daily. 30 tablet 0   dexamethasone (DECADRON) 4 MG tablet Take 5 tablets (20 mg total) by mouth once a week. (Patient not taking: Reported on 05/23/2023) 60 tablet 2   No current facility-administered medications for this visit.    OBJECTIVE: Vitals:   05/23/23 1328  BP: (!) 145/93  Pulse: 66  Resp: 16  Temp: 98.5 F (36.9 C)  SpO2: 98%        Body mass index is 27.88 kg/m.    ECOG FS:1 - Symptomatic but completely ambulatory  General: Well-developed, well-nourished, no acute distress. Eyes: Pink conjunctiva, anicteric sclera. HEENT: Normocephalic, moist mucous membranes. Lungs: No audible wheezing or coughing. Heart: Regular rate and rhythm. Abdomen: Soft, nontender, no obvious distention. Musculoskeletal: No edema, cyanosis, or clubbing. Neuro: Alert, answering all questions appropriately. Cranial nerves grossly intact. Skin: No rashes or petechiae noted. Psych: Normal affect.  LAB RESULTS:  Lab Results  Component Value Date   NA 136 05/23/2023   K 4.2 05/23/2023   CL 105 05/23/2023   CO2 26 05/23/2023   GLUCOSE 123 (H) 05/23/2023   BUN 22 05/23/2023   CREATININE 1.10 05/23/2023   CALCIUM 8.7 (L) 05/23/2023   PROT 5.7 (L) 05/23/2023   ALBUMIN 3.7 05/23/2023   AST 18 05/23/2023   ALT 8 05/23/2023   ALKPHOS 57 05/23/2023   BILITOT 0.5 05/23/2023    GFRNONAA >60 05/23/2023   GFRAA 29 (L) 10/16/2017    Lab Results  Component Value Date   WBC  4.0 05/23/2023   NEUTROABS 1.9 05/23/2023   HGB 12.1 (L) 05/23/2023   HCT 36.8 (L) 05/23/2023   MCV 105.4 (H) 05/23/2023   PLT 67 (L) 05/23/2023     STUDIES: CT ANGIO CHEST AORTA W/CM & OR WO/CM  Result Date: 05/02/2023 CLINICAL DATA:  89 year old male with history of aortic aneurysm EXAM: CT ANGIOGRAPHY CHEST WITH CONTRAST TECHNIQUE: Multidetector CT imaging of the chest was performed using the standard protocol during bolus administration of intravenous contrast. Multiplanar CT image reconstructions and MIPs were obtained to evaluate the vascular anatomy. RADIATION DOSE REDUCTION: This exam was performed according to the departmental dose-optimization program which includes automated exposure control, adjustment of the mA and/or kV according to patient size and/or use of iterative reconstruction technique. CONTRAST:  75mL OMNIPAQUE IOHEXOL 350 MG/ML SOLN COMPARISON:  03/22/2022 FINDINGS: Cardiovascular: Heart: Heart size borderline enlarged. No pericardial fluid/thickening. Calcifications of left main, left anterior descending, circumflex, right coronary arteries. Aorta: Minimal aortic valve calcifications. Greatest estimated diameter of the aortic annulus 26 mm on the coronal reformatted images. Greatest estimated diameter of the sino-tubular junction, 34 mm on the coronal images. Greatest estimated diameter of the ascending aorta on the axial images, 47 mm. Mild atherosclerotic changes of the aorta. Branch vessels are patent with a 3 vessel arch. Cervical cerebral vessels patent at the base of the neck. No pedunculated plaque, ulcerated plaque, dissection, periaortic fluid. No wall thickening. Pulmonary arteries: Timing of the contrast bolus is not optimized for evaluation of pulmonary artery filling defects. Unremarkable size of the main pulmonary artery. Mediastinum/Nodes: Hiatal hernia. No  adenopathy. Unremarkable central airways. Unremarkable thoracic inlet. Unremarkable appearance of the thoracic esophagus. Lungs/Pleura: Central airways are clear. No pleural effusion. No confluent airspace disease. No pneumothorax. Upper Abdomen: No acute finding of the upper abdomen. Right upper extremity PICC Musculoskeletal: Vertebral augmentation of T11, T12, L1. No displaced fracture. No bony canal narrowing. Osteopenia. Review of the MIP images confirms the above findings. IMPRESSION: Unchanged size and configuration of the ascending aorta, estimated 4.7 cm diameter on the current CT. Aortic aneurysm NOS (ICD10-I71.9). Aortic atherosclerosis and coronary artery disease. Aortic Atherosclerosis (ICD10-I70.0). Additional ancillary findings as above. Signed, Yvone Neu. Miachel Roux, RPVI [facility] Electronically Signed   By: Gilmer Mor D.O.   On: 05/02/2023 15:20   US Venous Img Lower Unilateral Left  Result Date: 05/01/2023 CLINICAL DATA:  Left mid thigh pain for 1 day EXAM: LEFT LOWER EXTREMITY VENOUS DOPPLER ULTRASOUND TECHNIQUE: Gray-scale sonography with compression, as well as color and duplex ultrasound, were performed to evaluate the deep venous system(s) from the level of the common femoral vein through the popliteal and proximal calf veins. COMPARISON:  06/12/2022 FINDINGS: VENOUS Normal compressibility of the common femoral, superficial femoral, and popliteal veins, as well as the visualized calf veins. Visualized portions of profunda femoral vein and great saphenous vein unremarkable. No filling defects to suggest DVT on grayscale or color Doppler imaging. Doppler waveforms show normal direction of venous flow, normal respiratory plasticity and response to augmentation. Limited views of the contralateral common femoral vein are unremarkable. OTHER Two ovoid heterogeneous structure is noted in the area of patient's pain in the left  thigh measuring 3.6 x 1.7 x 2.7 cm and 2.6 x 1.1 x 1.6 cm. Limitations: none IMPRESSION: 1. No left lower extremity DVT. 2. Targeted sonographic evaluation of the area of pain in the left thigh demonstrates 2 ovoid heterogeneous structures measuring 3.6 x 1.7 x 2.7 cm  and 2.6 x 1.1 x 1.6 cm. These may represent mildly enlarged lymph nodes, however there appearance is not typical. If the patient's pain persists, further evaluation with contrast-enhanced CT of this region should be performed. Electronically Signed   By: Acquanetta Belling M.D.   On: 05/01/2023 11:10   DG Femur Min 2 Views Left  Result Date: 05/01/2023 CLINICAL DATA:  Left mid thigh pain since yesterday afternoon. History of multiple myeloma. EXAM: LEFT FEMUR 2 VIEWS COMPARISON:  06/10/2022 FINDINGS: Well-circumscribed lucent bone lesion is identified within the mid shaft of the left femur which is unchanged compared with 02/19/2021. No signs of acute fracture or dislocation. Chronic left superior and inferior pubic rami fractures. Mild degenerative changes within the left hip. Hernia mesh fasteners are identified in the projection of the left hip and suprapubic region. IMPRESSION: 1. No acute findings. 2. Chronic left superior and inferior pubic rami fractures. 3. Stable lucent bone lesion within the mid shaft of the left femur. This is unchanged when compared with bone survey from 02/19/2021. Electronically Signed   By: Signa Kell M.D.   On: 05/01/2023 10:37    ASSESSMENT: Stage II Kappa chain myeloma.  PLAN:    Stage II kappa chain myeloma: (11:14 translocation, high risk) SPEP essentially negative and immunoglobulins are within normal limits.  Patient's kappa light chains initially improved from greater than 5400 down to 39.1.  He is now off Revlimid secondary to his abscess and subsequent sepsis and his most recent result on March 07, 2023 was 131.0.  Today's result is pending.  Patient has been instructed to continue to hold Xgeva,  Revlimid, and dexamethasone until he completes antibiotics in 2 months PET scan results from December 08, 2021 reviewed independently with no obvious evidence of progressive disease.  Nuclear med bone scan results from December 28, 2022 did not reveal any concerning pathology.  Patient initiated monthly Xgeva on August 11, 2021.  Return to clinic in 2 months after completion of antibiotics for further evaluation and discussion on whether or not to reinitiate treatment. Anemia: Hemoglobin improved to 12.1. Bone lesions: Nuclear med bone scan as above with no obvious evidence of progressive disease.  Leukopenia: Resolved. Thrombocytopenia: Chronic and unchanged.  Patient platelet count is 67. Pain: Improved.  Continue follow-up with pain clinic as indicated.  Nuclear medicine bone scan completed on December 28, 2022 did not report any active areas of myeloma.  Patient only takes long-acting morphine 30 mg every 8 hours.  He also takes Tylenol and ibuprofen sparingly.  Previously he was instructed that if his platelets fall below 50 this would need to be discontinued.  Radiation oncology has determined no further treatments are needed at this time.   Renal insufficiency: Resolved. Tooth abscess/sepsis: Patient now on p.o. Augmentin for 2 more months.  Continue follow-up with ID and ENT as scheduled.   Patient expressed understanding and was in agreement with this plan. He also understands that He can call clinic at any time with any questions, concerns, or complaints.     Cancer Staging  Kappa light chain myeloma (HCC) Staging form: Plasma Cell Myeloma and Plasma Cell Disorders, AJCC 8th Edition - Clinical stage from 04/16/2021: Albumin (g/dL): 3.8, ISS: Stage II, High-risk cytogenetics: Absent, LDH: Unknown - Signed by Jeralyn Ruths, MD on 04/16/2021 Albumin range (g/dL): Greater than or equal to 3.5 Cytogenetics: t(11;14) translocation Serum calcium level: Normal Serum creatinine level: Normal Bone  disease on imaging: Present  Jeralyn Ruths, MD  05/23/2023 4:28 PM

## 2023-05-24 DIAGNOSIS — I4891 Unspecified atrial fibrillation: Secondary | ICD-10-CM | POA: Diagnosis not present

## 2023-05-24 DIAGNOSIS — S0180XD Unspecified open wound of other part of head, subsequent encounter: Secondary | ICD-10-CM | POA: Diagnosis not present

## 2023-05-24 LAB — KAPPA/LAMBDA LIGHT CHAINS
Kappa free light chain: 235.4 mg/L — ABNORMAL HIGH (ref 3.3–19.4)
Kappa, lambda light chain ratio: 58.85 — ABNORMAL HIGH (ref 0.26–1.65)
Lambda free light chains: 4 mg/L — ABNORMAL LOW (ref 5.7–26.3)

## 2023-05-25 ENCOUNTER — Ambulatory Visit: Payer: PPO

## 2023-05-25 ENCOUNTER — Ambulatory Visit: Payer: PPO | Admitting: Physician Assistant

## 2023-05-26 ENCOUNTER — Ambulatory Visit: Payer: Self-pay | Admitting: *Deleted

## 2023-05-26 NOTE — Patient Instructions (Signed)
Visit Information  Thank you for taking time to visit with me today. Please don't hesitate to contact me if I can be of assistance to you before our next scheduled telephone appointment.  Our next appointment is by telephone on 10/14  Please call the care guide team at [phone] if you need to cancel or reschedule your appointment.   Please call the Suicide and Crisis Lifeline: 988 call the [place] National Suicide Prevention Lifeline: (270) 238-9838 or TTY: 203-742-2821 TTY 501-243-2855) to talk to a trained counselor call 1-800-273-TALK (toll free, 24 hour hotline) call 911 if you are experiencing a Mental Health or Behavioral Health Crisis or need someone to talk to.  Patient verbalizes understanding of instructions and care plan provided today and agrees to view in MyChart. Active MyChart status and patient understanding of how to access instructions and care plan via MyChart confirmed with patient.     The patient has been provided with contact information for the care management team and has been advised to call with any health related questions or concerns.   Kemper Durie [facility] Care Management Care Management Coordinator [phone]

## 2023-05-26 NOTE — Patient Outreach (Signed)
  Care Coordination   Follow Up Visit Note   05/26/2023 Name: PUNEET DORNBUSCH MRN: [id number] DOB: 06-Dec-1932  [name] is a 89 y.o. year old male who sees Dorothey Baseman, MD for primary care. I spoke with  [name] by phone today.  What matters to the patients health and wellness today?  Effective removal of skin cancer and treatment without complications    Goals Addressed             This Visit's Progress    Management of chronic conditions   On track     Interventions Today    Flowsheet Row Most Recent Value  Chronic Disease   Chronic disease during today's visit Other, Atrial Fibrillation (AFib), Hypertension (HTN)  [new skin cancer]  General Interventions   General Interventions Discussed/Reviewed General Interventions Reviewed, Doctor Visits  Doctor Visits Discussed/Reviewed Doctor Visits Reviewed, PCP, Specialist  [Will have mole removed on 9/23 and develop cancer treatment plan after completion of antibiotic course.  ID on 10/10]  PCP/Specialist Visits Compliance with follow-up visit  Education Interventions   Education Provided Provided Education  Provided Verbal Education On Medication, When to see the doctor, Other  [skin cancer treatment]              SDOH assessments and interventions completed:  No     Care Coordination Interventions:  Yes, provided   Follow up plan: Follow up call scheduled for 10/14    Encounter Outcome:  Patient Visit Completed   Kemper Durie, RN, MSN, [facility] Care Management Care Management Coordinator [phone]

## 2023-05-28 ENCOUNTER — Encounter: Payer: Self-pay | Admitting: Oncology

## 2023-05-30 ENCOUNTER — Ambulatory Visit: Payer: PPO

## 2023-05-30 ENCOUNTER — Other Ambulatory Visit: Payer: Self-pay | Admitting: *Deleted

## 2023-05-30 MED ORDER — MORPHINE SULFATE ER 30 MG PO TBCR: 30.0000 mg | EXTENDED_RELEASE_TABLET | Freq: Three times a day (TID) | ORAL | 0 refills | Status: DC

## 2023-05-30 MED ORDER — OXYCODONE HCL 10 MG PO TABS: 10.0000 mg | ORAL_TABLET | ORAL | 0 refills | Status: DC | PRN

## 2023-06-01 ENCOUNTER — Ambulatory Visit: Payer: PPO

## 2023-06-03 DIAGNOSIS — C9000 Multiple myeloma not having achieved remission: Secondary | ICD-10-CM | POA: Diagnosis not present

## 2023-06-03 DIAGNOSIS — R2689 Other abnormalities of gait and mobility: Secondary | ICD-10-CM | POA: Diagnosis not present

## 2023-06-03 DIAGNOSIS — S32591D Other specified fracture of right pubis, subsequent encounter for fracture with routine healing: Secondary | ICD-10-CM | POA: Diagnosis not present

## 2023-06-03 DIAGNOSIS — M6281 Muscle weakness (generalized): Secondary | ICD-10-CM | POA: Diagnosis not present

## 2023-06-03 DIAGNOSIS — J9601 Acute respiratory failure with hypoxia: Secondary | ICD-10-CM | POA: Diagnosis not present

## 2023-06-04 ENCOUNTER — Other Ambulatory Visit: Payer: Self-pay | Admitting: Hospice and Palliative Medicine

## 2023-06-05 ENCOUNTER — Other Ambulatory Visit: Payer: Self-pay | Admitting: Oncology

## 2023-06-05 DIAGNOSIS — C9000 Multiple myeloma not having achieved remission: Secondary | ICD-10-CM

## 2023-06-06 ENCOUNTER — Ambulatory Visit: Payer: PPO | Admitting: Physical Therapy

## 2023-06-06 ENCOUNTER — Encounter: Payer: Self-pay | Admitting: Hospice and Palliative Medicine

## 2023-06-06 DIAGNOSIS — L578 Other skin changes due to chronic exposure to nonionizing radiation: Secondary | ICD-10-CM | POA: Diagnosis not present

## 2023-06-06 DIAGNOSIS — C44329 Squamous cell carcinoma of skin of other parts of face: Secondary | ICD-10-CM | POA: Diagnosis not present

## 2023-06-06 DIAGNOSIS — L814 Other melanin hyperpigmentation: Secondary | ICD-10-CM | POA: Diagnosis not present

## 2023-06-06 DIAGNOSIS — L988 Other specified disorders of the skin and subcutaneous tissue: Secondary | ICD-10-CM | POA: Diagnosis not present

## 2023-06-07 DIAGNOSIS — S32592D Other specified fracture of left pubis, subsequent encounter for fracture with routine healing: Secondary | ICD-10-CM | POA: Diagnosis not present

## 2023-06-07 DIAGNOSIS — M6281 Muscle weakness (generalized): Secondary | ICD-10-CM | POA: Diagnosis not present

## 2023-06-07 DIAGNOSIS — C9000 Multiple myeloma not having achieved remission: Secondary | ICD-10-CM | POA: Diagnosis not present

## 2023-06-07 DIAGNOSIS — R2689 Other abnormalities of gait and mobility: Secondary | ICD-10-CM | POA: Diagnosis not present

## 2023-06-07 DIAGNOSIS — J9601 Acute respiratory failure with hypoxia: Secondary | ICD-10-CM | POA: Diagnosis not present

## 2023-06-08 ENCOUNTER — Ambulatory Visit: Payer: PPO | Admitting: Physical Therapy

## 2023-06-08 ENCOUNTER — Encounter: Payer: Self-pay | Admitting: Infectious Diseases

## 2023-06-13 ENCOUNTER — Ambulatory Visit: Payer: PPO | Admitting: Physical Therapy

## 2023-06-14 DIAGNOSIS — L905 Scar conditions and fibrosis of skin: Secondary | ICD-10-CM | POA: Diagnosis not present

## 2023-06-14 DIAGNOSIS — D0462 Carcinoma in situ of skin of left upper limb, including shoulder: Secondary | ICD-10-CM | POA: Diagnosis not present

## 2023-06-14 DIAGNOSIS — L570 Actinic keratosis: Secondary | ICD-10-CM | POA: Diagnosis not present

## 2023-06-14 DIAGNOSIS — Z859 Personal history of malignant neoplasm, unspecified: Secondary | ICD-10-CM | POA: Diagnosis not present

## 2023-06-14 DIAGNOSIS — D485 Neoplasm of uncertain behavior of skin: Secondary | ICD-10-CM | POA: Diagnosis not present

## 2023-06-15 ENCOUNTER — Ambulatory Visit: Payer: PPO | Admitting: Physical Therapy

## 2023-06-20 ENCOUNTER — Ambulatory Visit: Payer: PPO | Admitting: Physical Therapy

## 2023-06-20 ENCOUNTER — Other Ambulatory Visit: Payer: Self-pay | Admitting: Oncology

## 2023-06-20 ENCOUNTER — Telehealth: Payer: Self-pay

## 2023-06-20 DIAGNOSIS — C9000 Multiple myeloma not having achieved remission: Secondary | ICD-10-CM

## 2023-06-20 NOTE — Telephone Encounter (Signed)
Patient's wife call office stating patient was not able to get labs ordered by Dr. Rivka Safer done at PCP office. Would like to know if she can just go to lab this week before appt to have them done.  Informed her patient should be able to go. No questions at this time Juanita Laster, RMA

## 2023-06-21 ENCOUNTER — Other Ambulatory Visit
Admission: RE | Admit: 2023-06-21 | Discharge: 2023-06-21 | Disposition: A | Discharge status: Home / Self Care | Payer: PPO | Attending: Infectious Diseases | Admitting: Infectious Diseases

## 2023-06-21 DIAGNOSIS — M272 Inflammatory conditions of jaws: Secondary | ICD-10-CM | POA: Diagnosis not present

## 2023-06-21 DIAGNOSIS — M8668 Other chronic osteomyelitis, other site: Secondary | ICD-10-CM | POA: Diagnosis not present

## 2023-06-21 LAB — CBC WITH DIFFERENTIAL/PLATELET
Abs Immature Granulocytes: 0.01 10*3/uL (ref 0.00–0.07)
Basophils Absolute: 0 10*3/uL (ref 0.0–0.1)
Basophils Relative: 1 %
Eosinophils Absolute: 0.1 10*3/uL (ref 0.0–0.5)
Eosinophils Relative: 2 %
HCT: 35.8 % — ABNORMAL LOW (ref 39.0–52.0)
Hemoglobin: 11.8 g/dL — ABNORMAL LOW (ref 13.0–17.0)
Immature Granulocytes: 0 %
Lymphocytes Relative: 30 %
Lymphs Abs: 1.3 10*3/uL (ref 0.7–4.0)
MCH: 32.8 pg (ref 26.0–34.0)
MCHC: 33 g/dL (ref 30.0–36.0)
MCV: 99.4 fL (ref 80.0–100.0)
Monocytes Absolute: 0.5 10*3/uL (ref 0.1–1.0)
Monocytes Relative: 11 %
Neutro Abs: 2.5 10*3/uL (ref 1.7–7.7)
Neutrophils Relative %: 56 %
Platelets: 89 10*3/uL — ABNORMAL LOW (ref 150–400)
RBC: 3.6 MIL/uL — ABNORMAL LOW (ref 4.22–5.81)
RDW: 13.9 % (ref 11.5–15.5)
WBC: 4.3 10*3/uL (ref 4.0–10.5)
nRBC: 0 % (ref 0.0–0.2)

## 2023-06-21 LAB — COMPREHENSIVE METABOLIC PANEL
ALT: 8 U/L (ref 0–44)
AST: 19 U/L (ref 15–41)
Albumin: 3.8 g/dL (ref 3.5–5.0)
Alkaline Phosphatase: 74 U/L (ref 38–126)
Anion gap: 8 (ref 5–15)
BUN: 24 mg/dL — ABNORMAL HIGH (ref 8–23)
CO2: 26 mmol/L (ref 22–32)
Calcium: 8.9 mg/dL (ref 8.9–10.3)
Chloride: 104 mmol/L (ref 98–111)
Creatinine, Ser: 1.06 mg/dL (ref 0.61–1.24)
GFR, Estimated: >60 mL/min (ref >60)
Glucose, Bld: 137 mg/dL — ABNORMAL HIGH (ref 70–99)
Potassium: 4.1 mmol/L (ref 3.5–5.1)
Sodium: 138 mmol/L (ref 135–145)
Total Bilirubin: 0.6 mg/dL (ref 0.3–1.2)
Total Protein: 6.1 g/dL — ABNORMAL LOW (ref 6.5–8.1)

## 2023-06-21 LAB — SEDIMENTATION RATE: Sed Rate: 40 mm/h — ABNORMAL HIGH (ref 0–20)

## 2023-06-21 LAB — C-REACTIVE PROTEIN: CRP: 1.5 mg/dL — ABNORMAL HIGH (ref <1.0)

## 2023-06-22 ENCOUNTER — Ambulatory Visit: Payer: PPO | Admitting: Physical Therapy

## 2023-06-23 ENCOUNTER — Encounter: Payer: Self-pay | Admitting: Infectious Diseases

## 2023-06-23 ENCOUNTER — Ambulatory Visit: Payer: PPO | Attending: Infectious Diseases | Admitting: Infectious Diseases

## 2023-06-23 ENCOUNTER — Other Ambulatory Visit
Admission: RE | Admit: 2023-06-23 | Discharge: 2023-06-23 | Disposition: A | Discharge status: Home / Self Care | Payer: PPO | Source: Ambulatory Visit | Attending: Infectious Diseases | Admitting: Infectious Diseases

## 2023-06-23 VITALS — BP 149/84 | HR 66 | Temp 97.7°F | Ht 67.0 in | Wt 179.0 lb

## 2023-06-23 DIAGNOSIS — N183 Chronic kidney disease, stage 3 unspecified: Secondary | ICD-10-CM | POA: Diagnosis not present

## 2023-06-23 DIAGNOSIS — B954 Other streptococcus as the cause of diseases classified elsewhere: Secondary | ICD-10-CM | POA: Diagnosis not present

## 2023-06-23 DIAGNOSIS — C9000 Multiple myeloma not having achieved remission: Secondary | ICD-10-CM | POA: Diagnosis not present

## 2023-06-23 DIAGNOSIS — A4289 Other forms of actinomycosis: Secondary | ICD-10-CM | POA: Insufficient documentation

## 2023-06-23 DIAGNOSIS — N179 Acute kidney failure, unspecified: Secondary | ICD-10-CM | POA: Insufficient documentation

## 2023-06-23 DIAGNOSIS — L0201 Cutaneous abscess of face: Secondary | ICD-10-CM | POA: Insufficient documentation

## 2023-06-23 DIAGNOSIS — I129 Hypertensive chronic kidney disease with stage 1 through stage 4 chronic kidney disease, or unspecified chronic kidney disease: Secondary | ICD-10-CM | POA: Diagnosis not present

## 2023-06-23 DIAGNOSIS — R7881 Bacteremia: Secondary | ICD-10-CM | POA: Insufficient documentation

## 2023-06-23 NOTE — Patient Instructions (Addendum)
You are here  for follow up of the jaw infection and bacteremia- you have completed 60 days of antibiotic- on 9/28-  ( plan was to give total of 2 months or oral antibiotic)  Since you are doing well , we have made a collective decision to not restart and watch off antibiotic and to start chemotherapy. Will do a blood culture today

## 2023-06-23 NOTE — Progress Notes (Signed)
NAME: Jack [name]  DOB: 1933-07-18  MRN: [id number]  Date/Time: 06/23/2023 10:30 AM   Subjective:   ?pt here with his wife  Follow up of mandibular osteo, submental abscess Jack [name] is a 89 y.o. 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 The collection was not extending to the floor of the mouth. No breach of the mylohyoid was evident. Pt had actinomyces neuii in blood culture . Underwent aspiration of the submandibular abscess and it was peptostreptococcus. He was discharged home on IV ceftriaxone and po metronidazole until 05/11/23 After that he started augmentin- plan was to do 60 days of augmentin to finish Oct 26, but he never knew there was a refill and did not collect the meds- He completed antibiotic last week of sept He has been doing well No pain or swelling of mandible    Past Medical History:  Diagnosis Date   Ascending aortic aneurysm (HCC)    a. 12/2020 Echo: Asc Ao 48mm, Ao root 40mm. b. 03/2022 Asc Ao 4.6 cm (4.5 cm in 2019)   CKD (chronic kidney disease), stage III (HCC)    Compression fracture of body of thoracic vertebra (HCC)    Diastolic dysfunction    a. 12/2020 Echo: EF 50-55%, no rwma, mild LVH, GrI DD, nl RV fxn, mild AI. Asc Ao 48mm, Ao root 40mm.   Elevated prostate specific antigen (PSA)    has been 7 for a year    GERD (gastroesophageal reflux disease)    History of colon polyps 2008   [facility],    History of kidney stones    Hyperlipidemia    Hypertension    Myeloma (HCC)    PAF (paroxysmal  atrial fibrillation) (HCC)    a. 01/2021-->Eliquis (CHA2DS2VASc = 3-4).   Pain    Prostate hypertrophy    diagnosed at age 36 due to hematospermia    Past Surgical History:  Procedure Laterality Date   CATARACT EXTRACTION W/PHACO Left 01/10/2018   Procedure: CATARACT EXTRACTION PHACO AND INTRAOCULAR LENS PLACEMENT (IOC);  Surgeon: Galen Manila, MD;  Location: [facility];  Service: Ophthalmology;  Laterality: Left;  [name] 00:24.8 AP% 14.9 CDE 3.68 Fluid pack lot # [id number]   CATARACT EXTRACTION W/PHACO Right 01/25/2018   Procedure: CATARACT EXTRACTION PHACO AND INTRAOCULAR LENS PLACEMENT (IOC);  Surgeon: Galen Manila, MD;  Location: [facility];  Service: Ophthalmology;  Laterality: Right;  [name] 00:42 AP% 10.8 CDE 4.59 Fluid pack lot # [id number]   COLON SURGERY     CYSTOSCOPY W/ URETERAL STENT PLACEMENT Right 10/16/2017   Procedure: right  URETERAL STENT PLACEMENT,cystoscopy bilateral stent removal,rretrograde;  Surgeon: Riki Altes, MD;  Location: [facility];  Service: Urology;  Laterality: Right;   CYSTOSCOPY/URETEROSCOPY/HOLMIUM LASER/STENT PLACEMENT Right 12/16/2020   Procedure: CYSTOSCOPY/URETEROSCOPY/HOLMIUM LASER/STENT PLACEMENT;  Surgeon: Riki Altes, MD;  Location: [facility];  Service: Urology;  Laterality: Right;   EXTRACORPOREAL SHOCK WAVE LITHOTRIPSY Right 12/11/2020   Procedure: EXTRACORPOREAL SHOCK WAVE LITHOTRIPSY (ESWL);  Surgeon: Riki Altes, MD;  Location: [facility];  Service: Urology;  Laterality: Right;   IR BONE TUMOR(S)RF  ABLATION  04/16/2022   IR KYPHO EA ADDL LEVEL THORACIC OR LUMBAR  02/02/2021   IR KYPHO LUMBAR INC FX REDUCE BONE BX UNI/BIL CANNULATION INC/IMAGING  02/02/2021   IR KYPHO THORACIC WITH BONE BIOPSY  04/15/2022   KIDNEY STONE SURGERY     KYPHOPLASTY N/A 03/12/2021   Procedure: Nicki Reaper, L3;  Surgeon: Kennedy Bucker, MD;  Location: [facility];  Service: Orthopedics;  Laterality: N/A;   RESECTION SOFT TISSUE TUMOR LEG / ANKLE RADICAL  jan 2009    [facility],  right thigh/knee , nonmalignant   SMALL INTESTINE SURGERY  1946   implaed on picket fence, punctured stomach   TONSILLECTOMY      Social History   Socioeconomic History   Marital status: Married    Spouse name: Paramedic   Number of children: 4   Years of education: 16   Highest education level: Not on file  Occupational History   Occupation: Retired    Associate Professor: retired    Comment: Real Estate   Tobacco Use   Smoking status: Former    Current packs/day: 0.00    Types: Cigarettes    Quit date: 07/05/1965    Years since quitting: 58.0   Smokeless tobacco: Current   Tobacco comments:    Occasional cigar  Vaping Use   Vaping status: Never Used  Substance and Sexual Activity   Alcohol use: Not Currently    Alcohol/week: 1.0 standard drink of alcohol    Types: 1 Standard drinks or equivalent per week    Comment: occassionaly   Drug use: No   Sexual activity: Yes    Birth control/protection: None  Other Topics Concern   Not on file  Social History Narrative   Not on file   Social Determinants of Health   Financial Resource Strain: Low Risk  (01/03/2023)   Received from [facility], [facility]   Overall Financial Resource Strain (CARDIA)    Difficulty of Paying Living Expenses: Not hard at all  Food Insecurity: No Food Insecurity (04/03/2023)   Hunger Vital Sign    Worried About Running Out of Food in the Last Year: Never true    Ran Out of Food in the Last Year: Never true  Transportation Needs: No Transportation Needs (04/03/2023)   PRAPARE - Administrator, Civil Service (Medical): No    Lack of Transportation (Non-Medical): No  Physical Activity: Not on file  Stress: Not on file  Social Connections: Not on file  Intimate Partner Violence: Not At Risk (04/03/2023)   Humiliation, Afraid, Rape, and Kick questionnaire    Fear of Current or Ex-Partner: No    Emotionally Abused: No    Physically Abused: No     Sexually Abused: No    Family History  Problem Relation Age of Onset   Hypertension Father    Hyperlipidemia Father    Cancer Sister        thyroid - dx in late 20's   Allergies  Allergen Reactions   Quinolones     Aortic dilation contraindicates FQ   Amlodipine Swelling and Other (See Comments)    LE edema   Azithromycin Nausea And Vomiting   Lipitor [Atorvastatin]     Muscle pain in RT Leg    Lisinopril Cough   Zetia [Ezetimibe]     Pain in legs    Hydromorphone Hcl     Wife states "Medication makes extremely sedated and lethargic"   I? Current Outpatient Medications  Medication Sig Dispense Refill   apixaban (ELIQUIS) 5 MG TABS tablet Take 1 tablet (5 mg total) by mouth 2 (two) times daily. 180 tablet 0   Ascorbic Acid (VITAMIN C) 1000 MG tablet Take 1,000 mg by mouth in the morning and at bedtime.     Calcium Carb-Cholecalciferol (OYSTER SHELL CALCIUM W/D) 500-5 MG-MCG TABS Take 1 tablet by mouth in the morning and at bedtime.     carvedilol (COREG) 12.5 MG tablet Take 0.5 tablets (6.25 mg total) by mouth 2 (two) times daily. 180 tablet 3   feeding supplement (ENSURE ENLIVE / ENSURE PLUS) LIQD Take 237 mLs by mouth 3 (three) times daily between meals. (Patient taking differently: Take 237 mLs by mouth daily with lunch.) 237 mL 12   gabapentin (NEURONTIN) 300 MG capsule TAKE 2 CAPSULES BY MOUTH 3 TIMES DAILY. 180 capsule 2   ibuprofen (ADVIL) 200 MG tablet Take 200 mg by mouth 2 (two) times daily as needed.     lidocaine (XYLOCAINE) 5 % ointment APPLY TOPICALLY 3 (THREE) TIMES DAILY AS NEEDED FOR MILD PAIN OR MODERATE PAIN. 50 g 2   morphine (MS CONTIN) 30 MG 12 hr tablet Take 1 tablet (30 mg total) by mouth every 8 (eight) hours. 90 tablet 0   Multiple Vitamin (MULTIVITAMIN WITH MINERALS) TABS tablet Take 1 tablet by mouth daily.     Multiple Vitamins-Minerals (PRESERVISION AREDS PO) Take 1 capsule by mouth in the morning and at bedtime.     naloxone (NARCAN) nasal spray 4  mg/0.1 mL SPRAY 1 SPRAY INTO ONE NOSTRIL AS DIRECTED FOR OPIOID OVERDOSE (TURN PERSON ON SIDE AFTER DOSE. IF NO RESPONSE IN 2-3 MINUTES OR PERSON RESPONDS BUT RELAPSES, REPEAT USING A NEW SPRAY DEVICE AND SPRAY INTO THE OTHER NOSTRIL. CALL 911 AFTER USE.) * EMERGENCY USE ONLY * 1 each 0   omeprazole (PRILOSEC) 20 MG capsule Take 20 mg by mouth daily as needed (takes when taking ibuprofen).     Oxycodone HCl 10 MG TABS Take 1 tablet (10 mg total) by mouth every 4 (four) hours as needed (pain). 90 tablet 0   polyethylene glycol (MIRALAX / GLYCOLAX) 17 g packet Take 17 g by mouth 2 (two) times daily.  0   potassium citrate (UROCIT-K) 10 MEQ (1080 MG) SR tablet TAKE 1 TABLET (10 MEQ TOTAL) BY MOUTH 3 (THREE) TIMES DAILY WITH MEALS. 270 tablet 0   senna-docusate (SENOKOT-S) 8.6-50 MG tablet Take 1 tablet by mouth 2 (two) times daily. 30 tablet 0   dexamethasone (DECADRON) 4 MG tablet Take 5 tablets (20 mg total) by mouth once a week. (Patient not taking: Reported on 06/23/2023) 60 tablet 2   No current facility-administered medications for this visit.     Abtx:  Anti-infectives (From admission, onward)    None       REVIEW OF SYSTEMS:  Const: negative fever, negative chills, negative weight loss Eyes: negative diplopia or visual changes, negative eye pain ENT: negative coryza, negative sore throat Resp: negative cough, hemoptysis, dyspnea Cards: negative for chest pain, palpitations, lower extremity edema GU: negative for frequency, dysuria and hematuria GI: Negative for abdominal pain, diarrhea, bleeding, constipation Skin: negative for rash and pruritus Heme: negative for easy bruising and gum/nose bleeding MS:  back pain  Neurolo:negative for headaches, dizziness, vertigo, memory problems  Psych: negative for feelings of anxiety, depression  Endocrine: negative for thyroid, diabetes Allergy/Immunology- as above ? Objective:  VITALS:  BP (!) 149/84   Pulse 66   Temp  97.7 F (36.5  C) (Temporal)   Ht 5\' 7"  (1.702 m)   Wt 179 lb (81.2 kg)   SpO2 95%   BMI 28.04 kg/m   PHYSICAL EXAM:  General: Alert, cooperative, no distress, appears stated age.  Head: Normocephalic, without obvious abnormality, atraumatic. Eyes: Conjunctivae clear, anicteric sclerae. Pupils are equal ENT Nares normal. No drainage or sinus tenderness. Lips, mucosa, and tongue normal. No Thrush Neck: Supple, symmetrical, small swelling in the submental area Back: No CVA tenderness. Lungs: Clear to auscultation bilaterally. No Wheezing or Rhonchi. No rales. Heart: Regular rate and rhythm, no murmur, rub or gallop. Abdomen: Soft, non-tender,not distended. Bowel sounds normal. No masses Extremities: atraumatic, no cyanosis. No edema. No clubbing Skin: No rashes or lesions. Or bruising Lymph: Cervical, supraclavicular normal. Neurologic: Grossly non-focal Pertinent Labs Lab Results CBC    Component Value Date/Time   WBC 4.3 06/21/2023 1325   RBC 3.60 (L) 06/21/2023 1325   HGB 11.8 (L) 06/21/2023 1325   HGB 11.0 (L) 02/24/2021 1014   HCT 35.8 (L) 06/21/2023 1325   HCT 35.3 (L) 02/24/2021 1014   PLT 89 (L) 06/21/2023 1325   PLT 249 02/24/2021 1014   MCV 99.4 06/21/2023 1325   MCV 96 02/24/2021 1014   MCV 90 01/24/2013 0819   MCH 32.8 06/21/2023 1325   MCHC 33.0 06/21/2023 1325   RDW 13.9 06/21/2023 1325   RDW 15.7 (H) 02/24/2021 1014   RDW 12.9 01/24/2013 0819   LYMPHSABS 1.3 06/21/2023 1325   LYMPHSABS 1.2 01/24/2013 0819   MONOABS 0.5 06/21/2023 1325   MONOABS 0.3 01/24/2013 0819   EOSABS 0.1 06/21/2023 1325   EOSABS 0.0 01/24/2013 0819   BASOSABS 0.0 06/21/2023 1325   BASOSABS 0.0 01/24/2013 0819       Latest Ref Rng & Units 06/21/2023    1:25 PM 05/23/2023    1:18 PM 05/01/2023    9:12 AM  CMP  Glucose 70 - 99 mg/dL 409  811  914   BUN 8 - 23 mg/dL 24  22  21    Creatinine 0.61 - 1.24 mg/dL 7.82  9.56  2.13   Sodium 135 - 145 mmol/L 138  136  138   Potassium 3.5 - 5.1  mmol/L 4.1  4.2  3.9   Chloride 98 - 111 mmol/L 104  105  103   CO2 22 - 32 mmol/L 26  26  26    Calcium 8.9 - 10.3 mg/dL 8.9  8.7  8.5   Total Protein 6.5 - 8.1 g/dL 6.1  5.7  5.5   Total Bilirubin 0.3 - 1.2 mg/dL 0.6  0.5  0.6   Alkaline Phos 38 - 126 U/L 74  57  47   AST 15 - 41 U/L 19  18  16    ALT 0 - 44 U/L 8  8  7     ESR 29  CRP < 0.5?  Impression/Recommendation ?Submental abscess.  There was no evidence of osteomyelitis of the mandibular bone on imaging But with actinomyces in the blood it raised concern for a dental source and possible mandibular bone involvement. Culture of the abscess was peptostreptococcus So decided to treat like osteo with 4 weeks of iV ceftriaoxne + flagyl followed by Po augmentin for another 2 months Pt is here for follow up Doing very well He completed IV on 05/11/23 And then took 4 weeks more of PO augmentin and did not relaize he had a refill. So he has been off antibiotic since the  last week of September We made a decision to hold off restarting antibiotic and watch HE wants to restart chemo   ESR is 40  Actinomyces bacteremia. Has taken 2 months of antibiotic- will get blood culture today as he is off antibiotic for the past 2weeks   ? Kappa chain myeloma On Revlimid- on hold Will let Dr.Finnegan know to restart rx   Hypertension   AKI on CKD- improved ? ? ___________________________________________________ Discussed with patient, and wife discussed with Dr.Finnegan Follow PRN Note:  This document was prepared using Dragon voice recognition software and may include unintentional dictation errors.

## 2023-06-27 ENCOUNTER — Ambulatory Visit: Payer: PPO | Admitting: Physical Therapy

## 2023-06-27 ENCOUNTER — Ambulatory Visit: Payer: Self-pay | Admitting: *Deleted

## 2023-06-27 NOTE — Patient Outreach (Signed)
Care Coordination   Follow Up Visit Note   06/27/2023 Name: Jack [name] MRN: [id number] DOB: Jun 16, 1933  Jack [name] is a 89 y.o. year old male who sees Dorothey Baseman, MD for primary care. I spoke with  Jack [name] by phone today.  What matters to the patients health and wellness today?  Report he had skin cancer on his forehead removed without any issues, but now has skin cancer on his forearm.  He will have this removed in the next month.  He is leaving today for a beach trip.  Denies any urgent concerns, encouraged to contact this care manager with questions.      Goals Addressed             This Visit's Progress    Management of chronic conditions   On track       Interventions Today    Flowsheet Row Most Recent Value  Chronic Disease   Chronic disease during today's visit Other  [skin cancer]  General Interventions   General Interventions Discussed/Reviewed General Interventions Reviewed, Doctor Visits  Doctor Visits Discussed/Reviewed Doctor Visits Reviewed, Specialist  [oncology 11/18, dematology 11/25]  PCP/Specialist Visits Compliance with follow-up visit  Education Interventions   Education Provided Provided Education  Provided Verbal Education On When to see the doctor, Other  [skin care]              SDOH assessments and interventions completed:  No     Care Coordination Interventions:  Yes, provided   Follow up plan: Follow up call scheduled for 11/26    Encounter Outcome:  Patient Visit Completed   Kemper Durie RN, MSN, CCM [place]  [facility], [facility] RN Care Coordinator Direct Dial: [phone] / Main [phone] Fax [phone] Email: [email or web address]: [email or web address]

## 2023-06-28 LAB — CULTURE, BLOOD (ROUTINE X 2)
Culture: NO GROWTH
Special Requests: ADEQUATE

## 2023-06-28 LAB — CULTURE, BLOOD (SINGLE)
Culture: NO GROWTH
Special Requests: ADEQUATE

## 2023-06-29 ENCOUNTER — Telehealth: Payer: Self-pay

## 2023-06-29 ENCOUNTER — Ambulatory Visit: Payer: PPO | Admitting: Physical Therapy

## 2023-06-29 ENCOUNTER — Encounter: Payer: Self-pay | Admitting: Oncology

## 2023-06-29 NOTE — Telephone Encounter (Signed)
-----   Message from Lynn Ito sent at 06/28/2023  2:07 PM EDT ----- Please let him know that his blood culture done last week had no bacteria ----- Message ----- From: Interface, Lab In [place] Sent: 06/24/2023   4:39 AM EDT To: Lynn Ito, MD

## 2023-06-29 NOTE — Telephone Encounter (Deleted)
Hi Kallin,  The culture that was done with Dr. Rivka Safer last week showed no bacteria, which is good. Please let me know if you have any questions    Thanks

## 2023-06-30 ENCOUNTER — Encounter: Payer: Self-pay | Admitting: Oncology

## 2023-06-30 ENCOUNTER — Other Ambulatory Visit: Payer: Self-pay | Admitting: *Deleted

## 2023-06-30 MED ORDER — OXYCODONE HCL 10 MG PO TABS: 10.0000 mg | ORAL_TABLET | ORAL | 0 refills | Status: DC | PRN

## 2023-06-30 MED ORDER — MORPHINE SULFATE ER 30 MG PO TBCR: 30.0000 mg | EXTENDED_RELEASE_TABLET | Freq: Three times a day (TID) | ORAL | 0 refills | Status: DC

## 2023-07-03 DIAGNOSIS — J9601 Acute respiratory failure with hypoxia: Secondary | ICD-10-CM | POA: Diagnosis not present

## 2023-07-03 DIAGNOSIS — R2689 Other abnormalities of gait and mobility: Secondary | ICD-10-CM | POA: Diagnosis not present

## 2023-07-03 DIAGNOSIS — S32591D Other specified fracture of right pubis, subsequent encounter for fracture with routine healing: Secondary | ICD-10-CM | POA: Diagnosis not present

## 2023-07-03 DIAGNOSIS — M6281 Muscle weakness (generalized): Secondary | ICD-10-CM | POA: Diagnosis not present

## 2023-07-03 DIAGNOSIS — C9000 Multiple myeloma not having achieved remission: Secondary | ICD-10-CM | POA: Diagnosis not present

## 2023-07-04 ENCOUNTER — Ambulatory Visit: Payer: PPO | Admitting: Physical Therapy

## 2023-07-04 ENCOUNTER — Inpatient Hospital Stay: Payer: PPO | Attending: Oncology

## 2023-07-04 ENCOUNTER — Encounter: Payer: Self-pay | Admitting: Oncology

## 2023-07-04 ENCOUNTER — Inpatient Hospital Stay (HOSPITAL_BASED_OUTPATIENT_CLINIC_OR_DEPARTMENT_OTHER): Payer: PPO | Admitting: Oncology

## 2023-07-04 VITALS — BP 129/72 | HR 55 | Temp 96.9°F | Resp 16 | Ht 67.0 in | Wt 181.5 lb

## 2023-07-04 DIAGNOSIS — Z79899 Other long term (current) drug therapy: Secondary | ICD-10-CM | POA: Insufficient documentation

## 2023-07-04 DIAGNOSIS — R52 Pain, unspecified: Secondary | ICD-10-CM | POA: Insufficient documentation

## 2023-07-04 DIAGNOSIS — Z87891 Personal history of nicotine dependence: Secondary | ICD-10-CM | POA: Diagnosis not present

## 2023-07-04 DIAGNOSIS — D696 Thrombocytopenia, unspecified: Secondary | ICD-10-CM | POA: Insufficient documentation

## 2023-07-04 DIAGNOSIS — D72819 Decreased white blood cell count, unspecified: Secondary | ICD-10-CM | POA: Insufficient documentation

## 2023-07-04 DIAGNOSIS — C9000 Multiple myeloma not having achieved remission: Secondary | ICD-10-CM | POA: Diagnosis not present

## 2023-07-04 DIAGNOSIS — D649 Anemia, unspecified: Secondary | ICD-10-CM | POA: Diagnosis not present

## 2023-07-04 LAB — CBC WITH DIFFERENTIAL/PLATELET
Abs Immature Granulocytes: 0.01 10*3/uL (ref 0.00–0.07)
Basophils Absolute: 0 10*3/uL (ref 0.0–0.1)
Basophils Relative: 1 %
Eosinophils Absolute: 0.1 10*3/uL (ref 0.0–0.5)
Eosinophils Relative: 2 %
HCT: 38.1 % — ABNORMAL LOW (ref 39.0–52.0)
Hemoglobin: 12 g/dL — ABNORMAL LOW (ref 13.0–17.0)
Immature Granulocytes: 0 %
Lymphocytes Relative: 27 %
Lymphs Abs: 1 10*3/uL (ref 0.7–4.0)
MCH: 32.3 pg (ref 26.0–34.0)
MCHC: 31.5 g/dL (ref 30.0–36.0)
MCV: 102.4 fL — ABNORMAL HIGH (ref 80.0–100.0)
Monocytes Absolute: 0.5 10*3/uL (ref 0.1–1.0)
Monocytes Relative: 13 %
Neutro Abs: 2.2 10*3/uL (ref 1.7–7.7)
Neutrophils Relative %: 57 %
Platelets: 74 10*3/uL — ABNORMAL LOW (ref 150–400)
RBC: 3.72 MIL/uL — ABNORMAL LOW (ref 4.22–5.81)
RDW: 14.3 % (ref 11.5–15.5)
WBC: 3.8 10*3/uL — ABNORMAL LOW (ref 4.0–10.5)
nRBC: 0 % (ref 0.0–0.2)

## 2023-07-04 LAB — COMPREHENSIVE METABOLIC PANEL
ALT: 9 U/L (ref 0–44)
AST: 20 U/L (ref 15–41)
Albumin: 3.9 g/dL (ref 3.5–5.0)
Alkaline Phosphatase: 72 U/L (ref 38–126)
Anion gap: 8 (ref 5–15)
BUN: 24 mg/dL — ABNORMAL HIGH (ref 8–23)
CO2: 28 mmol/L (ref 22–32)
Calcium: 8.8 mg/dL — ABNORMAL LOW (ref 8.9–10.3)
Chloride: 101 mmol/L (ref 98–111)
Creatinine, Ser: 1.01 mg/dL (ref 0.61–1.24)
GFR, Estimated: >60 mL/min (ref >60)
Glucose, Bld: 106 mg/dL — ABNORMAL HIGH (ref 70–99)
Potassium: 4.2 mmol/L (ref 3.5–5.1)
Sodium: 137 mmol/L (ref 135–145)
Total Bilirubin: 0.6 mg/dL (ref 0.3–1.2)
Total Protein: 6.2 g/dL — ABNORMAL LOW (ref 6.5–8.1)

## 2023-07-04 NOTE — Progress Notes (Unsigned)
Would like the recent blood culture results looked at to make sure he is clear from infection so he can restart revlimid.

## 2023-07-04 NOTE — Progress Notes (Unsigned)
[facility]  Telephone:([phone]) [phone] Fax:(336) [phone]  ID: [name] OB: 04/18/1933  MR#: [id number]  XLK#:[id number]  Patient Care Team: Dorothey Baseman, MD as PCP - General (Family Medicine) End, Cristal Deer, MD as PCP - Cardiology (Cardiology) Quentin Cornwall, MD (Endocrinology) Jeralyn Ruths, MD as Consulting Physician (Hematology and Oncology) Kemper Durie, RN as [facility] Care Management   CHIEF COMPLAINT: Stage II kappa chain myeloma.  INTERVAL HISTORY: Patient returns to clinic today for repeat laboratory work, further evaluation, and reinitiation of Revlimid.  He has now completed his antibiotic course and feels nearly back to his baseline.  He continues to have chronic back pain.  He has no neurologic complaints.  He denies any fevers.  He has a good appetite and denies weight loss.  He denies chest pain, shortness of breath, cough, or hemoptysis.  He denies any nausea, vomiting, constipation, or diarrhea.  He has no urinary complaints.  Patient offers no further specific complaints today.  REVIEW OF SYSTEMS:   Review of Systems  Constitutional: Negative.  Negative for fever and malaise/fatigue.  Respiratory: Negative.  Negative for cough, hemoptysis and shortness of breath.   Cardiovascular:  Positive for leg swelling. Negative for chest pain.  Gastrointestinal: Negative.  Negative for abdominal pain.  Genitourinary: Negative.  Negative for dysuria and flank pain.  Musculoskeletal:  Positive for back pain. Negative for falls and joint pain.  Skin: Negative.  Negative for rash.  Neurological: Negative.  Negative for dizziness, focal weakness, weakness and headaches.  Psychiatric/Behavioral: Negative.  The patient is not nervous/anxious.     As per HPI. Otherwise, a complete review of systems is negative.  PAST MEDICAL HISTORY: Past Medical History:  Diagnosis Date   Ascending aortic aneurysm (HCC)    a. 12/2020 Echo:  Asc Ao 48mm, Ao root 40mm. b. 03/2022 Asc Ao 4.6 cm (4.5 cm in 2019)   CKD (chronic kidney disease), stage III (HCC)    Compression fracture of body of thoracic vertebra (HCC)    Diastolic dysfunction    a. 12/2020 Echo: EF 50-55%, no rwma, mild LVH, GrI DD, nl RV fxn, mild AI. Asc Ao 48mm, Ao root 40mm.   Elevated prostate specific antigen (PSA)    has been 7 for a year    GERD (gastroesophageal reflux disease)    History of colon polyps 2008   [facility],    History of kidney stones    Hyperlipidemia    Hypertension    Myeloma (HCC)    PAF (paroxysmal atrial fibrillation) (HCC)    a. 01/2021-->Eliquis (CHA2DS2VASc = 3-4).   Pain    Prostate hypertrophy    diagnosed at age 69 due to hematospermia    PAST SURGICAL HISTORY: Past Surgical History:  Procedure Laterality Date   CATARACT EXTRACTION W/PHACO Left 01/10/2018   Procedure: CATARACT EXTRACTION PHACO AND INTRAOCULAR LENS PLACEMENT (IOC);  Surgeon: Galen Manila, MD;  Location: [facility] ORS;  Service: Ophthalmology;  Laterality: Left;  [name] 00:24.8 AP% 14.9 CDE 3.68 Fluid pack lot # [id number]   CATARACT EXTRACTION W/PHACO Right 01/25/2018   Procedure: CATARACT EXTRACTION PHACO AND INTRAOCULAR LENS PLACEMENT (IOC);  Surgeon: Galen Manila, MD;  Location: [facility] ORS;  Service: Ophthalmology;  Laterality: Right;  [name] 00:42 AP% 10.8 CDE 4.59 Fluid pack lot # [id number]   COLON SURGERY     CYSTOSCOPY W/ URETERAL STENT PLACEMENT Right 10/16/2017   Procedure: right  URETERAL STENT PLACEMENT,cystoscopy bilateral stent removal,rretrograde;  Surgeon: Riki Altes, MD;  Location: [facility] ORS;  Service: Urology;  Laterality: Right;   CYSTOSCOPY/URETEROSCOPY/HOLMIUM LASER/STENT PLACEMENT Right 12/16/2020   Procedure: CYSTOSCOPY/URETEROSCOPY/HOLMIUM LASER/STENT PLACEMENT;  Surgeon: Riki Altes, MD;  Location: [facility] ORS;  Service: Urology;  Laterality: Right;   EXTRACORPOREAL SHOCK WAVE LITHOTRIPSY Right 12/11/2020   Procedure:  EXTRACORPOREAL SHOCK WAVE LITHOTRIPSY (ESWL);  Surgeon: Riki Altes, MD;  Location: [facility] ORS;  Service: Urology;  Laterality: Right;   IR BONE TUMOR(S)RF ABLATION  04/16/2022   IR KYPHO EA ADDL LEVEL THORACIC OR LUMBAR  02/02/2021   IR KYPHO LUMBAR INC FX REDUCE BONE BX UNI/BIL CANNULATION INC/IMAGING  02/02/2021   IR KYPHO THORACIC WITH BONE BIOPSY  04/15/2022   KIDNEY STONE SURGERY     KYPHOPLASTY N/A 03/12/2021   Procedure: Nicki Reaper, L3;  Surgeon: Kennedy Bucker, MD;  Location: [facility] ORS;  Service: Orthopedics;  Laterality: N/A;   RESECTION SOFT TISSUE TUMOR LEG / ANKLE RADICAL  jan 2009   [facility],  right thigh/knee , nonmalignant   SMALL INTESTINE SURGERY  1946   implaed on picket fence, punctured stomach   TONSILLECTOMY      FAMILY HISTORY: Family History  Problem Relation Age of Onset   Hypertension Father    Hyperlipidemia Father    Cancer Sister        thyroid - dx in late 20's    ADVANCED DIRECTIVES (Y/N):  N  HEALTH MAINTENANCE: Social History   Tobacco Use   Smoking status: Former    Current packs/day: 0.00    Types: Cigarettes    Quit date: 07/05/1965    Years since quitting: 58.0   Smokeless tobacco: Current   Tobacco comments:    Occasional cigar  Vaping Use   Vaping status: Never Used  Substance Use Topics   Alcohol use: Not Currently    Alcohol/week: 1.0 standard drink of alcohol    Types: 1 Standard drinks or equivalent per week    Comment: occassionaly   Drug use: No     Colonoscopy:  PAP:  Bone density:  Lipid panel:  Allergies  Allergen Reactions   Quinolones     Aortic dilation contraindicates FQ   Amlodipine Swelling and Other (See Comments)    LE edema   Azithromycin Nausea And Vomiting   Lipitor [Atorvastatin]     Muscle pain in RT Leg    Lisinopril Cough   Zetia [Ezetimibe]     Pain in legs    Hydromorphone Hcl     Wife states "Medication makes extremely sedated and lethargic"    Current Outpatient Medications  Medication  Sig Dispense Refill   apixaban (ELIQUIS) 5 MG TABS tablet Take 1 tablet (5 mg total) by mouth 2 (two) times daily. 180 tablet 0   Ascorbic Acid (VITAMIN C) 1000 MG tablet Take 1,000 mg by mouth in the morning and at bedtime.     Calcium Carb-Cholecalciferol (OYSTER SHELL CALCIUM W/D) 500-5 MG-MCG TABS Take 1 tablet by mouth in the morning and at bedtime.     carvedilol (COREG) 12.5 MG tablet Take 0.5 tablets (6.25 mg total) by mouth 2 (two) times daily. 180 tablet 3   feeding supplement (ENSURE ENLIVE / ENSURE PLUS) LIQD Take 237 mLs by mouth 3 (three) times daily between meals. (Patient taking differently: Take 237 mLs by mouth daily with lunch.) 237 mL 12   gabapentin (NEURONTIN) 300 MG capsule TAKE 2 CAPSULES BY MOUTH 3 TIMES DAILY. 180 capsule 2   ibuprofen (ADVIL) 200 MG tablet Take 200 mg by  mouth 2 (two) times daily as needed.     lidocaine (XYLOCAINE) 5 % ointment APPLY TOPICALLY 3 (THREE) TIMES DAILY AS NEEDED FOR MILD PAIN OR MODERATE PAIN. 50 g 2   morphine (MS CONTIN) 30 MG 12 hr tablet Take 1 tablet (30 mg total) by mouth every 8 (eight) hours. 90 tablet 0   Multiple Vitamin (MULTIVITAMIN WITH MINERALS) TABS tablet Take 1 tablet by mouth daily.     Multiple Vitamins-Minerals (PRESERVISION AREDS PO) Take 1 capsule by mouth in the morning and at bedtime.     naloxone (NARCAN) nasal spray 4 mg/0.1 mL SPRAY 1 SPRAY INTO ONE NOSTRIL AS DIRECTED FOR OPIOID OVERDOSE (TURN PERSON ON SIDE AFTER DOSE. IF NO RESPONSE IN 2-3 MINUTES OR PERSON RESPONDS BUT RELAPSES, REPEAT USING A NEW SPRAY DEVICE AND SPRAY INTO THE OTHER NOSTRIL. CALL 911 AFTER USE.) * EMERGENCY USE ONLY * 1 each 0   omeprazole (PRILOSEC) 20 MG capsule Take 20 mg by mouth daily as needed (takes when taking ibuprofen).     Oxycodone HCl 10 MG TABS Take 1 tablet (10 mg total) by mouth every 4 (four) hours as needed (pain). 90 tablet 0   polyethylene glycol (MIRALAX / GLYCOLAX) 17 g packet Take 17 g by mouth 2 (two) times daily.  0    potassium citrate (UROCIT-K) 10 MEQ (1080 MG) SR tablet TAKE 1 TABLET (10 MEQ TOTAL) BY MOUTH 3 (THREE) TIMES DAILY WITH MEALS. 270 tablet 0   senna-docusate (SENOKOT-S) 8.6-50 MG tablet Take 1 tablet by mouth 2 (two) times daily. 30 tablet 0   dexamethasone (DECADRON) 4 MG tablet Take 5 tablets (20 mg total) by mouth once a week. (Patient not taking: Reported on 06/23/2023) 60 tablet 2   No current facility-administered medications for this visit.    OBJECTIVE: Vitals:   07/04/23 1054  BP: 129/72  Pulse: (!) 55  Resp: 16  Temp: (!) 96.9 F (36.1 C)  SpO2: 97%        Body mass index is 28.43 kg/m.    ECOG FS:1 - Symptomatic but completely ambulatory  General: Well-developed, well-nourished, no acute distress. Eyes: Pink conjunctiva, anicteric sclera. HEENT: Normocephalic, moist mucous membranes. Lungs: No audible wheezing or coughing. Heart: Regular rate and rhythm. Abdomen: Soft, nontender, no obvious distention. Musculoskeletal: No edema, cyanosis, or clubbing. Neuro: Alert, answering all questions appropriately. Cranial nerves grossly intact. Skin: No rashes or petechiae noted. Psych: Normal affect.  LAB RESULTS:  Lab Results  Component Value Date   NA 137 07/04/2023   K 4.2 07/04/2023   CL 101 07/04/2023   CO2 28 07/04/2023   GLUCOSE 106 (H) 07/04/2023   BUN 24 (H) 07/04/2023   CREATININE 1.01 07/04/2023   CALCIUM 8.8 (L) 07/04/2023   PROT 6.2 (L) 07/04/2023   ALBUMIN 3.9 07/04/2023   AST 20 07/04/2023   ALT 9 07/04/2023   ALKPHOS 72 07/04/2023   BILITOT 0.6 07/04/2023   GFRNONAA >60 07/04/2023   GFRAA 29 (L) 10/16/2017    Lab Results  Component Value Date   WBC 3.8 (L) 07/04/2023   NEUTROABS 2.2 07/04/2023   HGB 12.0 (L) 07/04/2023   HCT 38.1 (L) 07/04/2023   MCV 102.4 (H) 07/04/2023   PLT 74 (L) 07/04/2023     STUDIES: No results found.  ASSESSMENT: Stage II Kappa chain myeloma.  PLAN:    Stage II kappa chain myeloma: (11:14  translocation, high risk) SPEP essentially negative and immunoglobulins are within normal limits.  Patient's kappa light  chains initially improved from greater than 5400 down to 39.1.  PET scan results from December 08, 2021 reviewed independently with no obvious evidence of progressive disease.  Nuclear med bone scan results from December 28, 2022 did not reveal any concerning pathology.  Patient was off Revlimid for approximately 4 months given admission for sepsis and abscess and extended antibiotic treatment.  His most recent kappa free light chains have trended up to 235.4.  He has been instructed to reinitiate Revlimid today.  Xgeva and other bisphosphonates have been discontinued.  Return to clinic in 4 weeks for further evaluation and continuation of treatment.   Anemia: Hemoglobin stable at 12.0.   Bone lesions: Nuclear med bone scan as above with no obvious evidence of progressive disease.  No further bisphosphonates as above. Leukopenia: Mild, monitor. Thrombocytopenia: Patient's platelet count remains decreased, but essentially stable at 74.  Reinitiate Revlimid cautiously as above. Pain: Improved.  Continue follow-up with pain clinic as indicated.  Nuclear medicine bone scan completed on December 28, 2022 did not report any active areas of myeloma.  Patient only takes long-acting morphine 30 mg every 8 hours.  He also takes Tylenol and ibuprofen sparingly.  Previously he was instructed that if his platelets fall below 50 this would need to be discontinued.  Radiation oncology has determined no further treatments are needed at this time.   Renal insufficiency: Resolved. Tooth abscess/sepsis: Resolved.  Patient is now off antibiotics.  Patient expressed understanding and was in agreement with this plan. He also understands that He can call clinic at any time with any questions, concerns, or complaints.     Cancer Staging  Kappa light chain myeloma (HCC) Staging form: Plasma Cell Myeloma and Plasma  Cell Disorders, AJCC 8th Edition - Clinical stage from 04/16/2021: Albumin (g/dL): 3.8, ISS: Stage II, High-risk cytogenetics: Absent, LDH: Unknown - Signed by Jeralyn Ruths, MD on 04/16/2021 Albumin range (g/dL): Greater than or equal to 3.5 Cytogenetics: t(11;14) translocation Serum calcium level: Normal Serum creatinine level: Normal Bone disease on imaging: Present  Jeralyn Ruths, MD   07/05/2023 10:49 AM

## 2023-07-05 ENCOUNTER — Encounter: Payer: Self-pay | Admitting: Oncology

## 2023-07-05 DIAGNOSIS — H353231 Exudative age-related macular degeneration, bilateral, with active choroidal neovascularization: Secondary | ICD-10-CM | POA: Diagnosis not present

## 2023-07-05 LAB — KAPPA/LAMBDA LIGHT CHAINS
Kappa free light chain: 307.1 mg/L — ABNORMAL HIGH (ref 3.3–19.4)
Kappa, lambda light chain ratio: 26.94 — ABNORMAL HIGH (ref 0.26–1.65)
Lambda free light chains: 11.4 mg/L (ref 5.7–26.3)

## 2023-07-06 ENCOUNTER — Other Ambulatory Visit: Payer: Self-pay | Admitting: Dermatology

## 2023-07-06 ENCOUNTER — Ambulatory Visit: Payer: PPO | Admitting: Physical Therapy

## 2023-07-06 DIAGNOSIS — C44329 Squamous cell carcinoma of skin of other parts of face: Secondary | ICD-10-CM

## 2023-07-07 DIAGNOSIS — R2689 Other abnormalities of gait and mobility: Secondary | ICD-10-CM | POA: Diagnosis not present

## 2023-07-07 DIAGNOSIS — C9000 Multiple myeloma not having achieved remission: Secondary | ICD-10-CM | POA: Diagnosis not present

## 2023-07-07 DIAGNOSIS — S32592D Other specified fracture of left pubis, subsequent encounter for fracture with routine healing: Secondary | ICD-10-CM | POA: Diagnosis not present

## 2023-07-07 DIAGNOSIS — M6281 Muscle weakness (generalized): Secondary | ICD-10-CM | POA: Diagnosis not present

## 2023-07-07 DIAGNOSIS — J9601 Acute respiratory failure with hypoxia: Secondary | ICD-10-CM | POA: Diagnosis not present

## 2023-07-08 DIAGNOSIS — D0462 Carcinoma in situ of skin of left upper limb, including shoulder: Secondary | ICD-10-CM | POA: Diagnosis not present

## 2023-07-08 DIAGNOSIS — C44629 Squamous cell carcinoma of skin of left upper limb, including shoulder: Secondary | ICD-10-CM | POA: Diagnosis not present

## 2023-07-11 ENCOUNTER — Inpatient Hospital Stay: Payer: PPO | Admitting: Hospice and Palliative Medicine

## 2023-07-11 DIAGNOSIS — C9000 Multiple myeloma not having achieved remission: Secondary | ICD-10-CM

## 2023-07-11 NOTE — Progress Notes (Signed)
Unable to reach patient or leave VM. Will reschedule.

## 2023-07-12 ENCOUNTER — Ambulatory Visit
Admission: RE | Admit: 2023-07-12 | Discharge: 2023-07-12 | Disposition: A | Discharge status: Home / Self Care | Payer: PPO | Source: Ambulatory Visit | Attending: Dermatology | Admitting: Dermatology

## 2023-07-12 DIAGNOSIS — C44329 Squamous cell carcinoma of skin of other parts of face: Secondary | ICD-10-CM | POA: Diagnosis not present

## 2023-07-12 DIAGNOSIS — I6523 Occlusion and stenosis of bilateral carotid arteries: Secondary | ICD-10-CM | POA: Diagnosis not present

## 2023-07-12 DIAGNOSIS — C44320 Squamous cell carcinoma of skin of unspecified parts of face: Secondary | ICD-10-CM | POA: Diagnosis not present

## 2023-07-12 MED ORDER — IOHEXOL 300 MG/ML  SOLN
75.0000 mL | Freq: Once | INTRAMUSCULAR | Status: AC | PRN
  Administered 2023-07-12: 75 mL via INTRAVENOUS

## 2023-07-16 ENCOUNTER — Other Ambulatory Visit: Payer: Self-pay | Admitting: Nurse Practitioner

## 2023-07-16 DIAGNOSIS — C9000 Multiple myeloma not having achieved remission: Secondary | ICD-10-CM

## 2023-07-20 ENCOUNTER — Encounter: Payer: Self-pay | Admitting: Oncology

## 2023-07-25 ENCOUNTER — Other Ambulatory Visit: Payer: Self-pay | Admitting: Pharmacist

## 2023-07-25 ENCOUNTER — Ambulatory Visit: Payer: PPO | Admitting: Physician Assistant

## 2023-07-25 DIAGNOSIS — C9000 Multiple myeloma not having achieved remission: Secondary | ICD-10-CM

## 2023-07-25 MED ORDER — LENALIDOMIDE 20 MG PO CAPS
20.0000 mg | ORAL_CAPSULE | Freq: Every day | ORAL | 0 refills | Status: DC
Start: 2023-07-25 — End: 2023-08-22

## 2023-07-26 ENCOUNTER — Ambulatory Visit: Payer: PPO | Admitting: Physician Assistant

## 2023-07-26 ENCOUNTER — Encounter: Payer: Self-pay | Admitting: Physician Assistant

## 2023-07-26 VITALS — BP 109/58 | HR 43

## 2023-07-26 DIAGNOSIS — N200 Calculus of kidney: Secondary | ICD-10-CM

## 2023-07-26 DIAGNOSIS — N401 Enlarged prostate with lower urinary tract symptoms: Secondary | ICD-10-CM | POA: Diagnosis not present

## 2023-07-26 DIAGNOSIS — Z87442 Personal history of urinary calculi: Secondary | ICD-10-CM

## 2023-07-26 DIAGNOSIS — R972 Elevated prostate specific antigen [PSA]: Secondary | ICD-10-CM

## 2023-07-26 DIAGNOSIS — R351 Nocturia: Secondary | ICD-10-CM | POA: Diagnosis not present

## 2023-07-26 LAB — BLADDER SCAN AMB NON-IMAGING: Scan Result: 2

## 2023-07-26 NOTE — Progress Notes (Signed)
07/26/2023 3:21 PM   Jack [name] 20-Jul-1933 [id number]  CC: Chief Complaint  Patient presents with   Follow-up   HPI: Jack [name] is a 89 y.o. male with PMH multiple myeloma, elevated PSA who was declined workup, BPH with nocturia who stopped Flomax due to concerns for orthostasis, and nephrolithiasis with uric acid stones on potassium citrate who presents today for annual follow-up.  He is accompanied today by his wife, who contributes to HPI.  Today he reports no significant urologic changes in the past year.  He specifically denies dysuria, flank pain, and gross hematuria.  He remains on potassium citrate and tolerates this well.  Overall he is pleased with his urinary symptoms with no concerns today.  IPSS 14/mostly satisfied as below.  PVR 2 mL.  Most recent PSA dated 10/28/2020 was 15.49.   IPSS     Row Name 07/26/23 1100         International Prostate Symptom Score   How often have you had the sensation of not emptying your bladder? Less than half the time     How often have you had to urinate less than every two hours? About half the time     How often have you found you stopped and started again several times when you urinated? Less than 1 in 5 times     How often have you found it difficult to postpone urination? Less than half the time     How often have you had a weak urinary stream? Less than 1 in 5 times     How often have you had to strain to start urination? Less than 1 in 5 times     How many times did you typically get up at night to urinate? 4 Times     Total IPSS Score 14       Quality of Life due to urinary symptoms   If you were to spend the rest of your life with your urinary condition just the way it is now how would you feel about that? Mostly Satisfied               PMH: Past Medical History:  Diagnosis Date   Ascending aortic aneurysm (HCC)    a. 12/2020 Echo: Asc Ao 48mm, Ao root 40mm. b. 03/2022 Asc Ao 4.6 cm (4.5 cm in 2019)   CKD  (chronic kidney disease), stage III (HCC)    Compression fracture of body of thoracic vertebra (HCC)    Diastolic dysfunction    a. 12/2020 Echo: EF 50-55%, no rwma, mild LVH, GrI DD, nl RV fxn, mild AI. Asc Ao 48mm, Ao root 40mm.   Elevated prostate specific antigen (PSA)    has been 7 for a year    GERD (gastroesophageal reflux disease)    History of colon polyps 2008   [facility],    History of kidney stones    Hyperlipidemia    Hypertension    Myeloma (HCC)    PAF (paroxysmal atrial fibrillation) (HCC)    a. 01/2021-->Eliquis (CHA2DS2VASc = 3-4).   Pain    Prostate hypertrophy    diagnosed at age 10 due to hematospermia    Surgical History: Past Surgical History:  Procedure Laterality Date   CATARACT EXTRACTION W/PHACO Left 01/10/2018   Procedure: CATARACT EXTRACTION PHACO AND INTRAOCULAR LENS PLACEMENT (IOC);  Surgeon: Galen Manila, MD;  Location: [facility] ORS;  Service: Ophthalmology;  Laterality: Left;  [name] 00:24.8 AP% 14.9 CDE 3.68 Fluid  pack lot # [id number]   CATARACT EXTRACTION W/PHACO Right 01/25/2018   Procedure: CATARACT EXTRACTION PHACO AND INTRAOCULAR LENS PLACEMENT (IOC);  Surgeon: Galen Manila, MD;  Location: [facility] ORS;  Service: Ophthalmology;  Laterality: Right;  [name] 00:42 AP% 10.8 CDE 4.59 Fluid pack lot # [id number]   COLON SURGERY     CYSTOSCOPY W/ URETERAL STENT PLACEMENT Right 10/16/2017   Procedure: right  URETERAL STENT PLACEMENT,cystoscopy bilateral stent removal,rretrograde;  Surgeon: Riki Altes, MD;  Location: [facility] ORS;  Service: Urology;  Laterality: Right;   CYSTOSCOPY/URETEROSCOPY/HOLMIUM LASER/STENT PLACEMENT Right 12/16/2020   Procedure: CYSTOSCOPY/URETEROSCOPY/HOLMIUM LASER/STENT PLACEMENT;  Surgeon: Riki Altes, MD;  Location: [facility] ORS;  Service: Urology;  Laterality: Right;   EXTRACORPOREAL SHOCK WAVE LITHOTRIPSY Right 12/11/2020   Procedure: EXTRACORPOREAL SHOCK WAVE LITHOTRIPSY (ESWL);  Surgeon: Riki Altes, MD;  Location: [facility]  ORS;  Service: Urology;  Laterality: Right;   IR BONE TUMOR(S)RF ABLATION  04/16/2022   IR KYPHO EA ADDL LEVEL THORACIC OR LUMBAR  02/02/2021   IR KYPHO LUMBAR INC FX REDUCE BONE BX UNI/BIL CANNULATION INC/IMAGING  02/02/2021   IR KYPHO THORACIC WITH BONE BIOPSY  04/15/2022   KIDNEY STONE SURGERY     KYPHOPLASTY N/A 03/12/2021   Procedure: Nicki Reaper, L3;  Surgeon: Kennedy Bucker, MD;  Location: [facility] ORS;  Service: Orthopedics;  Laterality: N/A;   RESECTION SOFT TISSUE TUMOR LEG / ANKLE RADICAL  jan 2009   [facility],  right thigh/knee , nonmalignant   SMALL INTESTINE SURGERY  1946   implaed on picket fence, punctured stomach   TONSILLECTOMY      Home Medications:  Allergies as of 07/26/2023       Reactions   Quinolones    Aortic dilation contraindicates FQ   Amlodipine Swelling, Other (See Comments)   LE edema   Azithromycin Nausea And Vomiting   Lipitor [atorvastatin]    Muscle pain in RT Leg    Lisinopril Cough   Zetia [ezetimibe]    Pain in legs    Hydromorphone Hcl    Wife states "Medication makes extremely sedated and lethargic"        Medication List        Accurate as of July 26, 2023  3:21 PM. If you have any questions, ask your nurse or doctor.          carvedilol 12.5 MG tablet Commonly known as: COREG Take 0.5 tablets (6.25 mg total) by mouth 2 (two) times daily.   dexamethasone 4 MG tablet Commonly known as: DECADRON Take 5 tablets (20 mg total) by mouth once a week.   Eliquis 5 MG Tabs tablet Generic drug: apixaban TAKE 1 TABLET BY MOUTH TWICE A DAY   feeding supplement Liqd Take 237 mLs by mouth 3 (three) times daily between meals. What changed: when to take this   gabapentin 300 MG capsule Commonly known as: NEURONTIN TAKE 2 CAPSULES BY MOUTH 3 TIMES DAILY.   ibuprofen 200 MG tablet Commonly known as: ADVIL Take 200 mg by mouth 2 (two) times daily as needed.   lenalidomide 20 MG capsule Commonly known as: REVLIMID Take 1 capsule (20 mg  total) by mouth daily. Take for 21 days, then hold for 7 days. Repeat every 28 days.   lidocaine 5 % ointment Commonly known as: XYLOCAINE APPLY TOPICALLY 3 (THREE) TIMES DAILY AS NEEDED FOR MILD PAIN OR MODERATE PAIN.   morphine 30 MG 12 hr tablet Commonly known as: MS CONTIN Take 1 tablet (30 mg total) by  mouth every 8 (eight) hours.   multivitamin with minerals Tabs tablet Take 1 tablet by mouth daily.   naloxone 4 MG/0.1ML Liqd nasal spray kit Commonly known as: NARCAN SPRAY 1 SPRAY INTO ONE NOSTRIL AS DIRECTED FOR OPIOID OVERDOSE (TURN PERSON ON SIDE AFTER DOSE. IF NO RESPONSE IN 2-3 MINUTES OR PERSON RESPONDS BUT RELAPSES, REPEAT USING A NEW SPRAY DEVICE AND SPRAY INTO THE OTHER NOSTRIL. CALL 911 AFTER USE.) * EMERGENCY USE ONLY *   omeprazole 20 MG capsule Commonly known as: PRILOSEC Take 20 mg by mouth daily as needed (takes when taking ibuprofen).   Oxycodone HCl 10 MG Tabs Take 1 tablet (10 mg total) by mouth every 4 (four) hours as needed (pain).   Oyster Shell Calcium w/D 500-5 MG-MCG Tabs Take 1 tablet by mouth in the morning and at bedtime.   polyethylene glycol 17 g packet Commonly known as: MIRALAX / GLYCOLAX Take 17 g by mouth 2 (two) times daily.   potassium citrate 10 MEQ (1080 MG) SR tablet Commonly known as: UROCIT-K TAKE 1 TABLET (10 MEQ TOTAL) BY MOUTH 3 (THREE) TIMES DAILY WITH MEALS.   PRESERVISION AREDS PO Take 1 capsule by mouth in the morning and at bedtime.   senna-docusate 8.6-50 MG tablet Commonly known as: Senokot-S Take 1 tablet by mouth 2 (two) times daily.   vitamin C 1000 MG tablet Take 1,000 mg by mouth in the morning and at bedtime.        Allergies:  Allergies  Allergen Reactions   Quinolones     Aortic dilation contraindicates FQ   Amlodipine Swelling and Other (See Comments)    LE edema   Azithromycin Nausea And Vomiting   Lipitor [Atorvastatin]     Muscle pain in RT Leg    Lisinopril Cough   Zetia [Ezetimibe]      Pain in legs    Hydromorphone Hcl     Wife states "Medication makes extremely sedated and lethargic"    Family History: Family History  Problem Relation Age of Onset   Hypertension Father    Hyperlipidemia Father    Cancer Sister        thyroid - dx in late 20's    Social History:   reports that he quit smoking about 58 years ago. His smoking use included cigarettes. He uses smokeless tobacco. He reports that he does not currently use alcohol after a past usage of about 1.0 standard drink of alcohol per week. He reports that he does not use drugs.  Physical Exam: BP (!) 109/58   Pulse (!) 43   Constitutional:  Alert and oriented, no acute distress, nontoxic appearing HEENT: [place], AT Cardiovascular: No clubbing, cyanosis, or edema Respiratory: Normal respiratory effort, no increased work of breathing Skin: No rashes, bruises or suspicious lesions Neurologic: Grossly intact, no focal deficits, moving all 4 extremities Psychiatric: Normal mood and affect  Laboratory Data: Results for orders placed or performed in visit on 07/26/23  BLADDER SCAN AMB NON-IMAGING  Result Value Ref Range   Scan Result 2 ml    Assessment & Plan:   1. Nephrolithiasis No flank pain or gross hematuria within the past year.  We discussed that with his history of uric acid stones, we would keep a low threshold to pursue CT scan if he develops acute stone symptoms.  He is in agreement with this plan.  Will plan to continue potassium citrate.  2. Elevated prostate specific antigen (PSA) Patient continues to decline further workup for  his elevated PSA.  He understands that this likely represents a prostate cancer.  We discussed that if his PSA were markedly elevated, we may pursue empiric ADT to manage advanced disease, however given his multiple myeloma he prefers to defer this, which is reasonable.  3. Benign prostatic hyperplasia with nocturia IPSS with moderate symptoms, though overall he is pleased off  pharmacotherapy and emptying appropriately.  If he develops more obstructive symptoms in the future, may consider silodosin to reduce his risk for orthostasis, though will keep him off pharmacotherapy for now. - BLADDER SCAN AMB NON-IMAGING  Return in about 1 year (around 07/25/2024) for Annual IPSS, PVR.  Carman Ching, PA-C  [facility] [place] [address] [place], [place] [address] [phone]

## 2023-07-31 ENCOUNTER — Encounter: Payer: Self-pay | Admitting: Oncology

## 2023-08-01 ENCOUNTER — Inpatient Hospital Stay: Payer: PPO | Admitting: Oncology

## 2023-08-01 ENCOUNTER — Inpatient Hospital Stay: Payer: PPO

## 2023-08-01 ENCOUNTER — Inpatient Hospital Stay: Payer: PPO | Admitting: Pharmacist

## 2023-08-01 ENCOUNTER — Ambulatory Visit: Payer: PPO | Admitting: Oncology

## 2023-08-01 ENCOUNTER — Other Ambulatory Visit: Payer: Self-pay | Admitting: *Deleted

## 2023-08-01 ENCOUNTER — Other Ambulatory Visit: Payer: PPO

## 2023-08-01 MED ORDER — MORPHINE SULFATE ER 30 MG PO TBCR: 30.0000 mg | EXTENDED_RELEASE_TABLET | Freq: Three times a day (TID) | ORAL | 0 refills | Status: DC

## 2023-08-01 MED ORDER — OXYCODONE HCL 10 MG PO TABS: 10.0000 mg | ORAL_TABLET | ORAL | 0 refills | Status: DC | PRN

## 2023-08-07 DIAGNOSIS — R2689 Other abnormalities of gait and mobility: Secondary | ICD-10-CM | POA: Diagnosis not present

## 2023-08-07 DIAGNOSIS — J9601 Acute respiratory failure with hypoxia: Secondary | ICD-10-CM | POA: Diagnosis not present

## 2023-08-07 DIAGNOSIS — S32592D Other specified fracture of left pubis, subsequent encounter for fracture with routine healing: Secondary | ICD-10-CM | POA: Diagnosis not present

## 2023-08-07 DIAGNOSIS — M6281 Muscle weakness (generalized): Secondary | ICD-10-CM | POA: Diagnosis not present

## 2023-08-07 DIAGNOSIS — C9000 Multiple myeloma not having achieved remission: Secondary | ICD-10-CM | POA: Diagnosis not present

## 2023-08-09 ENCOUNTER — Ambulatory Visit: Payer: Self-pay | Admitting: *Deleted

## 2023-08-09 NOTE — Patient Outreach (Signed)
  Care Coordination   Follow Up Visit Note   08/09/2023 Name: Jack [name] MRN: [id number] DOB: 1932-09-17  Jack [name] is a 89 y.o. year old male who sees Dorothey Baseman, MD for primary care. I spoke with  Jack [name] by phone today.  What matters to the patients health and wellness today?  Patient report feeling good since cancer removed, back pain is much better.  Denies any urgent concerns, encouraged to contact this care manager with questions.      Goals Addressed             This Visit's Progress    Management of chronic conditions   On track    Interventions Today    Flowsheet Row Most Recent Value  Chronic Disease   Chronic disease during today's visit Other  [skin cancer and chronic back pain]  General Interventions   General Interventions Discussed/Reviewed General Interventions Reviewed, Doctor Visits  [skin cancer removed, cleared by dermatology, CT was clear]  Doctor Visits Discussed/Reviewed Doctor Visits Reviewed, Specialist  [Oncology 12/2, oncology palliative NP on 12/20]  PCP/Specialist Visits Compliance with follow-up visit  Education Interventions   Education Provided Provided Education  Provided Verbal Education On Medication, When to see the doctor              SDOH assessments and interventions completed:  No     Care Coordination Interventions:  Yes, provided   Follow up plan: Follow up call scheduled for 1/8    Encounter Outcome:  Patient Visit Completed   Rodney Langton, RN, MSN, CCM   [facility], [facility] RN Care Coordinator Direct Dial: [phone] / Main [phone] Fax [phone] Email: [email or web address]: [email or web address]

## 2023-08-12 ENCOUNTER — Other Ambulatory Visit: Payer: Self-pay | Admitting: Internal Medicine

## 2023-08-12 ENCOUNTER — Other Ambulatory Visit: Payer: Self-pay | Admitting: Urology

## 2023-08-15 ENCOUNTER — Encounter: Payer: Self-pay | Admitting: Oncology

## 2023-08-15 ENCOUNTER — Inpatient Hospital Stay: Payer: PPO | Admitting: Pharmacist

## 2023-08-15 ENCOUNTER — Inpatient Hospital Stay (HOSPITAL_BASED_OUTPATIENT_CLINIC_OR_DEPARTMENT_OTHER): Payer: PPO | Admitting: Oncology

## 2023-08-15 ENCOUNTER — Inpatient Hospital Stay: Payer: PPO | Attending: Oncology

## 2023-08-15 ENCOUNTER — Telehealth: Payer: Self-pay | Admitting: Internal Medicine

## 2023-08-15 VITALS — BP 117/68 | HR 53 | Temp 98.3°F | Resp 16 | Ht 67.0 in | Wt 173.5 lb

## 2023-08-15 DIAGNOSIS — D72819 Decreased white blood cell count, unspecified: Secondary | ICD-10-CM | POA: Insufficient documentation

## 2023-08-15 DIAGNOSIS — G8929 Other chronic pain: Secondary | ICD-10-CM | POA: Insufficient documentation

## 2023-08-15 DIAGNOSIS — M549 Dorsalgia, unspecified: Secondary | ICD-10-CM | POA: Diagnosis not present

## 2023-08-15 DIAGNOSIS — C9000 Multiple myeloma not having achieved remission: Secondary | ICD-10-CM

## 2023-08-15 DIAGNOSIS — D696 Thrombocytopenia, unspecified: Secondary | ICD-10-CM | POA: Diagnosis not present

## 2023-08-15 DIAGNOSIS — Z79899 Other long term (current) drug therapy: Secondary | ICD-10-CM | POA: Diagnosis not present

## 2023-08-15 DIAGNOSIS — Z87891 Personal history of nicotine dependence: Secondary | ICD-10-CM | POA: Insufficient documentation

## 2023-08-15 LAB — CBC WITH DIFFERENTIAL/PLATELET
Abs Immature Granulocytes: 0.01 10*3/uL (ref 0.00–0.07)
Basophils Absolute: 0 10*3/uL (ref 0.0–0.1)
Basophils Relative: 1 %
Eosinophils Absolute: 0.3 10*3/uL (ref 0.0–0.5)
Eosinophils Relative: 7 %
HCT: 40.5 % (ref 39.0–52.0)
Hemoglobin: 13 g/dL (ref 13.0–17.0)
Immature Granulocytes: 0 %
Lymphocytes Relative: 26 %
Lymphs Abs: 0.9 10*3/uL (ref 0.7–4.0)
MCH: 32.3 pg (ref 26.0–34.0)
MCHC: 32.1 g/dL (ref 30.0–36.0)
MCV: 100.7 fL — ABNORMAL HIGH (ref 80.0–100.0)
Monocytes Absolute: 0.4 10*3/uL (ref 0.1–1.0)
Monocytes Relative: 11 %
Neutro Abs: 1.9 10*3/uL (ref 1.7–7.7)
Neutrophils Relative %: 55 %
Platelets: 58 10*3/uL — ABNORMAL LOW (ref 150–400)
RBC: 4.02 MIL/uL — ABNORMAL LOW (ref 4.22–5.81)
RDW: 15.3 % (ref 11.5–15.5)
WBC: 3.5 10*3/uL — ABNORMAL LOW (ref 4.0–10.5)
nRBC: 0 % (ref 0.0–0.2)

## 2023-08-15 LAB — COMPREHENSIVE METABOLIC PANEL
ALT: 11 U/L (ref 0–44)
AST: 15 U/L (ref 15–41)
Albumin: 3.5 g/dL (ref 3.5–5.0)
Alkaline Phosphatase: 69 U/L (ref 38–126)
Anion gap: 9 (ref 5–15)
BUN: 21 mg/dL (ref 8–23)
CO2: 28 mmol/L (ref 22–32)
Calcium: 8.5 mg/dL — ABNORMAL LOW (ref 8.9–10.3)
Chloride: 101 mmol/L (ref 98–111)
Creatinine, Ser: 1.2 mg/dL (ref 0.61–1.24)
GFR, Estimated: 58 mL/min — ABNORMAL LOW (ref >60)
Glucose, Bld: 150 mg/dL — ABNORMAL HIGH (ref 70–99)
Potassium: 4.1 mmol/L (ref 3.5–5.1)
Sodium: 138 mmol/L (ref 135–145)
Total Bilirubin: 0.7 mg/dL (ref <1.2)
Total Protein: 5.7 g/dL — ABNORMAL LOW (ref 6.5–8.1)

## 2023-08-15 NOTE — Telephone Encounter (Signed)
Mr. Girvin can purchase this over the counter with a prescription.  If he needs a prescription from an insurance standpoint, it would be best for him to reach out to his PCP for further guidance.  Yvonne Kendall, MD [facility]

## 2023-08-15 NOTE — Progress Notes (Signed)
[facility]  Telephone:(336) [phone] Fax:(336) [phone]  ID: [name] OB: 04/28/1933  MR#: [id number]  GMW#:[id number]  Patient Care Team: Dorothey Baseman, MD as PCP - General (Family Medicine) End, Cristal Deer, MD as PCP - Cardiology (Cardiology) Quentin Cornwall, MD (Endocrinology) Jeralyn Ruths, MD as Consulting Physician (Hematology and Oncology) Rodney Langton, RN as [facility] Care Management   CHIEF COMPLAINT: Stage II kappa chain myeloma.  INTERVAL HISTORY: Patient returns to clinic today for repeat laboratory work, further evaluation, and continuation of 20 mg Revlimid.  He currently feels well and is asymptomatic.  He continues to have chronic back pain, but states it is "manageable".  He otherwise feels well.  He has no neurologic complaints.  He denies any fevers.  He has a good appetite and denies weight loss.  He denies chest pain, shortness of breath, cough, or hemoptysis.  He denies any nausea, vomiting, constipation, or diarrhea.  He has no urinary complaints.  Patient offers no further specific complaints today.  REVIEW OF SYSTEMS:   Review of Systems  Constitutional: Negative.  Negative for fever and malaise/fatigue.  Respiratory: Negative.  Negative for cough, hemoptysis and shortness of breath.   Cardiovascular: Negative.  Negative for chest pain and leg swelling.  Gastrointestinal: Negative.  Negative for abdominal pain.  Genitourinary: Negative.  Negative for dysuria and flank pain.  Musculoskeletal:  Positive for back pain. Negative for falls and joint pain.  Skin: Negative.  Negative for rash.  Neurological: Negative.  Negative for dizziness, focal weakness, weakness and headaches.  Psychiatric/Behavioral: Negative.  The patient is not nervous/anxious.     As per HPI. Otherwise, a complete review of systems is negative.  PAST MEDICAL HISTORY: Past Medical History:  Diagnosis Date   Ascending aortic  aneurysm (HCC)    a. 12/2020 Echo: Asc Ao 48mm, Ao root 40mm. b. 03/2022 Asc Ao 4.6 cm (4.5 cm in 2019)   CKD (chronic kidney disease), stage III (HCC)    Compression fracture of body of thoracic vertebra (HCC)    Diastolic dysfunction    a. 12/2020 Echo: EF 50-55%, no rwma, mild LVH, GrI DD, nl RV fxn, mild AI. Asc Ao 48mm, Ao root 40mm.   Elevated prostate specific antigen (PSA)    has been 7 for a year    GERD (gastroesophageal reflux disease)    History of colon polyps 2008   [facility],    History of kidney stones    Hyperlipidemia    Hypertension    Myeloma (HCC)    PAF (paroxysmal atrial fibrillation) (HCC)    a. 01/2021-->Eliquis (CHA2DS2VASc = 3-4).   Pain    Prostate hypertrophy    diagnosed at age 61 due to hematospermia    PAST SURGICAL HISTORY: Past Surgical History:  Procedure Laterality Date   CATARACT EXTRACTION W/PHACO Left 01/10/2018   Procedure: CATARACT EXTRACTION PHACO AND INTRAOCULAR LENS PLACEMENT (IOC);  Surgeon: Galen Manila, MD;  Location: [facility] ORS;  Service: Ophthalmology;  Laterality: Left;  [name] 00:24.8 AP% 14.9 CDE 3.68 Fluid pack lot # [id number]   CATARACT EXTRACTION W/PHACO Right 01/25/2018   Procedure: CATARACT EXTRACTION PHACO AND INTRAOCULAR LENS PLACEMENT (IOC);  Surgeon: Galen Manila, MD;  Location: [facility] ORS;  Service: Ophthalmology;  Laterality: Right;  [name] 00:42 AP% 10.8 CDE 4.59 Fluid pack lot # [id number]   COLON SURGERY     CYSTOSCOPY W/ URETERAL STENT PLACEMENT Right 10/16/2017   Procedure: right  URETERAL STENT PLACEMENT,cystoscopy bilateral stent removal,rretrograde;  Surgeon: Riki Altes, MD;  Location: [facility] ORS;  Service: Urology;  Laterality: Right;   CYSTOSCOPY/URETEROSCOPY/HOLMIUM LASER/STENT PLACEMENT Right 12/16/2020   Procedure: CYSTOSCOPY/URETEROSCOPY/HOLMIUM LASER/STENT PLACEMENT;  Surgeon: Riki Altes, MD;  Location: [facility] ORS;  Service: Urology;  Laterality: Right;   EXTRACORPOREAL SHOCK WAVE LITHOTRIPSY Right  12/11/2020   Procedure: EXTRACORPOREAL SHOCK WAVE LITHOTRIPSY (ESWL);  Surgeon: Riki Altes, MD;  Location: [facility] ORS;  Service: Urology;  Laterality: Right;   IR BONE TUMOR(S)RF ABLATION  04/16/2022   IR KYPHO EA ADDL LEVEL THORACIC OR LUMBAR  02/02/2021   IR KYPHO LUMBAR INC FX REDUCE BONE BX UNI/BIL CANNULATION INC/IMAGING  02/02/2021   IR KYPHO THORACIC WITH BONE BIOPSY  04/15/2022   KIDNEY STONE SURGERY     KYPHOPLASTY N/A 03/12/2021   Procedure: Nicki Reaper, L3;  Surgeon: Kennedy Bucker, MD;  Location: [facility] ORS;  Service: Orthopedics;  Laterality: N/A;   RESECTION SOFT TISSUE TUMOR LEG / ANKLE RADICAL  jan 2009   [facility],  right thigh/knee , nonmalignant   SMALL INTESTINE SURGERY  1946   implaed on picket fence, punctured stomach   TONSILLECTOMY      FAMILY HISTORY: Family History  Problem Relation Age of Onset   Hypertension Father    Hyperlipidemia Father    Cancer Sister        thyroid - dx in late 20's    ADVANCED DIRECTIVES (Y/N):  N  HEALTH MAINTENANCE: Social History   Tobacco Use   Smoking status: Former    Current packs/day: 0.00    Types: Cigarettes    Quit date: 07/05/1965    Years since quitting: 58.1   Smokeless tobacco: Current   Tobacco comments:    Occasional cigar  Vaping Use   Vaping status: Never Used  Substance Use Topics   Alcohol use: Not Currently    Alcohol/week: 1.0 standard drink of alcohol    Types: 1 Standard drinks or equivalent per week    Comment: occassionaly   Drug use: No     Colonoscopy:  PAP:  Bone density:  Lipid panel:  Allergies  Allergen Reactions   Quinolones     Aortic dilation contraindicates FQ   Amlodipine Swelling and Other (See Comments)    LE edema   Azithromycin Nausea And Vomiting   Lipitor [Atorvastatin]     Muscle pain in RT Leg    Lisinopril Cough   Zetia [Ezetimibe]     Pain in legs    Hydromorphone Hcl     Wife states "Medication makes extremely sedated and lethargic"    Current Outpatient  Medications  Medication Sig Dispense Refill   Ascorbic Acid (VITAMIN C) 1000 MG tablet Take 1,000 mg by mouth in the morning and at bedtime.     Calcium Carb-Cholecalciferol (OYSTER SHELL CALCIUM W/D) 500-5 MG-MCG TABS Take 1 tablet by mouth in the morning and at bedtime.     carvedilol (COREG) 12.5 MG tablet Take 0.5 tablets (6.25 mg total) by mouth 2 (two) times daily. 180 tablet 3   dexamethasone (DECADRON) 4 MG tablet Take 5 tablets (20 mg total) by mouth once a week. 60 tablet 2   ELIQUIS 5 MG TABS tablet TAKE 1 TABLET BY MOUTH TWICE A DAY 180 tablet 0   feeding supplement (ENSURE ENLIVE / ENSURE PLUS) LIQD Take 237 mLs by mouth 3 (three) times daily between meals. (Patient taking differently: Take 237 mLs by mouth daily with lunch.) 237 mL 12   gabapentin (NEURONTIN) 300 MG capsule  TAKE 2 CAPSULES BY MOUTH 3 TIMES DAILY. 180 capsule 2   ibuprofen (ADVIL) 200 MG tablet Take 200 mg by mouth 2 (two) times daily as needed.     lenalidomide (REVLIMID) 20 MG capsule Take 1 capsule (20 mg total) by mouth daily. Take for 21 days, then hold for 7 days. Repeat every 28 days. 21 capsule 0   lidocaine (XYLOCAINE) 5 % ointment APPLY TOPICALLY 3 (THREE) TIMES DAILY AS NEEDED FOR MILD PAIN OR MODERATE PAIN. 50 g 2   morphine (MS CONTIN) 30 MG 12 hr tablet Take 1 tablet (30 mg total) by mouth every 8 (eight) hours. 90 tablet 0   Multiple Vitamin (MULTIVITAMIN WITH MINERALS) TABS tablet Take 1 tablet by mouth daily.     Multiple Vitamins-Minerals (PRESERVISION AREDS PO) Take 1 capsule by mouth in the morning and at bedtime.     naloxone (NARCAN) nasal spray 4 mg/0.1 mL SPRAY 1 SPRAY INTO ONE NOSTRIL AS DIRECTED FOR OPIOID OVERDOSE (TURN PERSON ON SIDE AFTER DOSE. IF NO RESPONSE IN 2-3 MINUTES OR PERSON RESPONDS BUT RELAPSES, REPEAT USING A NEW SPRAY DEVICE AND SPRAY INTO THE OTHER NOSTRIL. CALL 911 AFTER USE.) * EMERGENCY USE ONLY * 1 each 0   omeprazole (PRILOSEC) 20 MG capsule Take 20 mg by mouth daily as  needed (takes when taking ibuprofen).     Oxycodone HCl 10 MG TABS Take 1 tablet (10 mg total) by mouth every 4 (four) hours as needed (pain). 90 tablet 0   polyethylene glycol (MIRALAX / GLYCOLAX) 17 g packet Take 17 g by mouth 2 (two) times daily.  0   potassium citrate (UROCIT-K) 10 MEQ (1080 MG) SR tablet TAKE 1 TABLET (10 MEQ TOTAL) BY MOUTH 3 (THREE) TIMES DAILY WITH MEALS. 270 tablet 0   senna-docusate (SENOKOT-S) 8.6-50 MG tablet Take 1 tablet by mouth 2 (two) times daily. 30 tablet 0   No current facility-administered medications for this visit.    OBJECTIVE: Vitals:   08/15/23 1011  BP: 117/68  Pulse: (!) 53  Resp: 16  Temp: 98.3 F (36.8 C)  SpO2: 99%        Body mass index is 27.17 kg/m.    ECOG FS:1 - Symptomatic but completely ambulatory  General: Well-developed, well-nourished, no acute distress. Eyes: Pink conjunctiva, anicteric sclera. HEENT: Normocephalic, moist mucous membranes. Lungs: No audible wheezing or coughing. Heart: Regular rate and rhythm. Abdomen: Soft, nontender, no obvious distention. Musculoskeletal: No edema, cyanosis, or clubbing. Neuro: Alert, answering all questions appropriately. Cranial nerves grossly intact. Skin: No rashes or petechiae noted. Psych: Normal affect.  LAB RESULTS:  Lab Results  Component Value Date   NA 138 08/15/2023   K 4.1 08/15/2023   CL 101 08/15/2023   CO2 28 08/15/2023   GLUCOSE 150 (H) 08/15/2023   BUN 21 08/15/2023   CREATININE 1.20 08/15/2023   CALCIUM 8.5 (L) 08/15/2023   PROT 5.7 (L) 08/15/2023   ALBUMIN 3.5 08/15/2023   AST 15 08/15/2023   ALT 11 08/15/2023   ALKPHOS 69 08/15/2023   BILITOT 0.7 08/15/2023   GFRNONAA 58 (L) 08/15/2023   GFRAA 29 (L) 10/16/2017    Lab Results  Component Value Date   WBC 3.5 (L) 08/15/2023   NEUTROABS 1.9 08/15/2023   HGB 13.0 08/15/2023   HCT 40.5 08/15/2023   MCV 100.7 (H) 08/15/2023   PLT 58 (L) 08/15/2023     STUDIES: No results  found.  ASSESSMENT: Stage II Kappa chain myeloma.  PLAN:  Stage II kappa chain myeloma: (11:14 translocation, high risk) SPEP essentially negative and immunoglobulins are within normal limits.  Patient's kappa light chains initially improved from greater than 5400 down to 39.1.  PET scan results from December 08, 2021 reviewed independently with no obvious evidence of progressive disease.  Nuclear med bone scan results from December 28, 2022 did not reveal any concerning pathology.  Patient was off Revlimid for approximately 4 months given admission for sepsis and abscess and extended antibiotic treatment.  His most recent kappa free light chains have trended up to 307.1.  Today's results are pending.  Continue Revlimid 20 mg daily for 21 days with 7 days off.  Will await dental clearance before reinitiating Xgeva.  Return to clinic in 4 weeks for laboratory work and evaluation by clinical pharmacy.  Patient would then return to clinic in 8 weeks laboratory work, further evaluation, and continuation of treatment.   Anemia: Resolved. Bone lesions: Nuclear med bone scan as above with no obvious evidence of progressive disease.  Will await dental clearance prior to reinitiating Xgeva.   Leukopenia: Chronic and unchanged. Thrombocytopenia: Patient's platelet count has declined slightly to 58.  Continue Revlimid as above.   Pain: Improved.  Continue follow-up with pain clinic as indicated.  Nuclear medicine bone scan completed on December 28, 2022 did not report any active areas of myeloma.  Patient only takes long-acting morphine 30 mg every 8 hours.  He also takes Tylenol and ibuprofen sparingly.  Previously he was instructed that if his platelets fall below 50 this would need to be discontinued.  Radiation oncology has determined no further treatments are needed at this time.   Renal insufficiency: Resolved. Tooth abscess/sepsis: Resolved.  Patient is now off antibiotics.  Patient expressed understanding and  was in agreement with this plan. He also understands that He can call clinic at any time with any questions, concerns, or complaints.     Cancer Staging  Kappa light chain myeloma (HCC) Staging form: Plasma Cell Myeloma and Plasma Cell Disorders, AJCC 8th Edition - Clinical stage from 04/16/2021: Albumin (g/dL): 3.8, ISS: Stage II, High-risk cytogenetics: Absent, LDH: Unknown - Signed by Jeralyn Ruths, MD on 04/16/2021 Albumin range (g/dL): Greater than or equal to 3.5 Cytogenetics: t(11;14) translocation Serum calcium level: Normal Serum creatinine level: Normal Bone disease on imaging: Present  Jeralyn Ruths, MD   08/15/2023 10:47 AM

## 2023-08-15 NOTE — Telephone Encounter (Signed)
August 15, 2023 Parke Poisson, RN  to Yvonne Kendall, MD     08/15/23  9:44 AM Please advise Me  to Parke Poisson, RN     08/15/23  8:59 AM Note Please advise if ok to refill historical medication.

## 2023-08-15 NOTE — Telephone Encounter (Signed)
Please advise if ok to refill historical medication. ? ?

## 2023-08-15 NOTE — Progress Notes (Signed)
Oral Chemotherapy Clinic [facility]  Telephone:([phone] Fax:([phone]  Patient Care Team: Dorothey Baseman, MD as PCP - General (Family Medicine) End, Cristal Deer, MD as PCP - Cardiology (Cardiology) Quentin Cornwall, MD (Endocrinology) Jeralyn Ruths, MD as Consulting Physician (Hematology and Oncology) Rodney Langton, RN as [facility] Care Management   Name of the patient: Jack [name]  [id number]  [birth year]   Date of visit: 08/15/23  HPI: Patient is a 89 y.o. male with newly diagnosed multiple myeloma. Currently treated with Revlimid (lenalidomide) and dexamethasone. Patient was initiated on an all oral regimen because he was unable to physically make it into clinic at the time treatment due to his disease/performance status. His status improved he was able to come back to inperson appts on 07/14/21.  Reason for Consult: Oral chemotherapy follow-up for lenalidomide therapy.   PAST MEDICAL HISTORY: Past Medical History:  Diagnosis Date   Ascending aortic aneurysm (HCC)    a. 12/2020 Echo: Asc Ao 48mm, Ao root 40mm. b. 03/2022 Asc Ao 4.6 cm (4.5 cm in 2019)   CKD (chronic kidney disease), stage III (HCC)    Compression fracture of body of thoracic vertebra (HCC)    Diastolic dysfunction    a. 12/2020 Echo: EF 50-55%, no rwma, mild LVH, GrI DD, nl RV fxn, mild AI. Asc Ao 48mm, Ao root 40mm.   Elevated prostate specific antigen (PSA)    has been 7 for a year    GERD (gastroesophageal reflux disease)    History of colon polyps 2008   [facility],    History of kidney stones    Hyperlipidemia    Hypertension    Myeloma (HCC)    PAF (paroxysmal atrial fibrillation) (HCC)    a. 01/2021-->Eliquis (CHA2DS2VASc = 3-4).   Pain    Prostate hypertrophy    diagnosed at age 83 due to hematospermia    HEMATOLOGY/ONCOLOGY HISTORY:  Oncology History  Kappa light chain myeloma (HCC)  03/12/2021 Initial Diagnosis   Kappa light chain  myeloma (HCC)   03/27/2021 - 04/06/2021 Chemotherapy   Patient is on Treatment Plan : MYELOMA NON-TRANSPLANT CANDIDATES VRd SQ q21d      04/16/2021 Cancer Staging   Staging form: Plasma Cell Myeloma and Plasma Cell Disorders, AJCC 8th Edition - Clinical stage from 04/16/2021: Albumin (g/dL): 3.8, ISS: Stage II, High-risk cytogenetics: Absent, LDH: Unknown - Signed by Jeralyn Ruths, MD on 04/16/2021 Albumin range (g/dL): Greater than or equal to 3.5 Cytogenetics: t(11;14) translocation Serum calcium level: Normal Serum creatinine level: Normal Bone disease on imaging: Present     ALLERGIES:  is allergic to quinolones, amlodipine, azithromycin, lipitor [atorvastatin], lisinopril, zetia [ezetimibe], and hydromorphone hcl.  MEDICATIONS:  Current Outpatient Medications  Medication Sig Dispense Refill   Ascorbic Acid (VITAMIN C) 1000 MG tablet Take 1,000 mg by mouth in the morning and at bedtime.     Calcium Carb-Cholecalciferol (OYSTER SHELL CALCIUM W/D) 500-5 MG-MCG TABS Take 1 tablet by mouth in the morning and at bedtime.     carvedilol (COREG) 12.5 MG tablet Take 0.5 tablets (6.25 mg total) by mouth 2 (two) times daily. 180 tablet 3   dexamethasone (DECADRON) 4 MG tablet Take 5 tablets (20 mg total) by mouth once a week. 60 tablet 2   ELIQUIS 5 MG TABS tablet TAKE 1 TABLET BY MOUTH TWICE A DAY 180 tablet 0   feeding supplement (ENSURE ENLIVE / ENSURE PLUS) LIQD Take 237 mLs by mouth 3 (three) times daily between  meals. (Patient taking differently: Take 237 mLs by mouth daily with lunch.) 237 mL 12   gabapentin (NEURONTIN) 300 MG capsule TAKE 2 CAPSULES BY MOUTH 3 TIMES DAILY. 180 capsule 2   ibuprofen (ADVIL) 200 MG tablet Take 200 mg by mouth 2 (two) times daily as needed.     lenalidomide (REVLIMID) 20 MG capsule Take 1 capsule (20 mg total) by mouth daily. Take for 21 days, then hold for 7 days. Repeat every 28 days. 21 capsule 0   lidocaine (XYLOCAINE) 5 % ointment APPLY TOPICALLY 3  (THREE) TIMES DAILY AS NEEDED FOR MILD PAIN OR MODERATE PAIN. 50 g 2   morphine (MS CONTIN) 30 MG 12 hr tablet Take 1 tablet (30 mg total) by mouth every 8 (eight) hours. 90 tablet 0   Multiple Vitamin (MULTIVITAMIN WITH MINERALS) TABS tablet Take 1 tablet by mouth daily.     Multiple Vitamins-Minerals (PRESERVISION AREDS PO) Take 1 capsule by mouth in the morning and at bedtime.     naloxone (NARCAN) nasal spray 4 mg/0.1 mL SPRAY 1 SPRAY INTO ONE NOSTRIL AS DIRECTED FOR OPIOID OVERDOSE (TURN PERSON ON SIDE AFTER DOSE. IF NO RESPONSE IN 2-3 MINUTES OR PERSON RESPONDS BUT RELAPSES, REPEAT USING A NEW SPRAY DEVICE AND SPRAY INTO THE OTHER NOSTRIL. CALL 911 AFTER USE.) * EMERGENCY USE ONLY * 1 each 0   omeprazole (PRILOSEC) 20 MG capsule Take 20 mg by mouth daily as needed (takes when taking ibuprofen).     Oxycodone HCl 10 MG TABS Take 1 tablet (10 mg total) by mouth every 4 (four) hours as needed (pain). 90 tablet 0   polyethylene glycol (MIRALAX / GLYCOLAX) 17 g packet Take 17 g by mouth 2 (two) times daily.  0   potassium citrate (UROCIT-K) 10 MEQ (1080 MG) SR tablet TAKE 1 TABLET (10 MEQ TOTAL) BY MOUTH 3 (THREE) TIMES DAILY WITH MEALS. 270 tablet 0   senna-docusate (SENOKOT-S) 8.6-50 MG tablet Take 1 tablet by mouth 2 (two) times daily. 30 tablet 0   No current facility-administered medications for this visit.    VITAL SIGNS: There were no vitals taken for this visit. There were no vitals filed for this visit.  Estimated body mass index is 27.17 kg/m as calculated from the following:   Height as of an earlier encounter on 08/15/23: 5\' 7"  (1.702 m).   Weight as of an earlier encounter on 08/15/23: 78.7 kg (173 lb 8 oz).  LABS: CBC:    Component Value Date/Time   WBC 3.5 (L) 08/15/2023 0959   HGB 13.0 08/15/2023 0959   HGB 11.0 (L) 02/24/2021 1014   HCT 40.5 08/15/2023 0959   HCT 35.3 (L) 02/24/2021 1014   PLT 58 (L) 08/15/2023 0959   PLT 249 02/24/2021 1014   MCV 100.7 (H) 08/15/2023  0959   MCV 96 02/24/2021 1014   MCV 90 01/24/2013 0819   NEUTROABS 1.9 08/15/2023 0959   NEUTROABS 7.4 (H) 01/24/2013 0819   LYMPHSABS 0.9 08/15/2023 0959   LYMPHSABS 1.2 01/24/2013 0819   MONOABS 0.4 08/15/2023 0959   MONOABS 0.3 01/24/2013 0819   EOSABS 0.3 08/15/2023 0959   EOSABS 0.0 01/24/2013 0819   BASOSABS 0.0 08/15/2023 0959   BASOSABS 0.0 01/24/2013 0819   Comprehensive Metabolic Panel:    Component Value Date/Time   NA 137 07/04/2023 1028   NA 136 02/24/2021 1014   NA 138 01/24/2013 0819   K 4.2 07/04/2023 1028   K 3.8 01/24/2013 0819   CL  101 07/04/2023 1028   CL 105 01/24/2013 0819   CO2 28 07/04/2023 1028   CO2 25 01/24/2013 0819   BUN 24 (H) 07/04/2023 1028   BUN 23 02/24/2021 1014   BUN 20 (H) 01/24/2013 0819   CREATININE 1.01 07/04/2023 1028   CREATININE 1.52 (H) 01/24/2013 0819   GLUCOSE 106 (H) 07/04/2023 1028   GLUCOSE 171 (H) 01/24/2013 0819   CALCIUM 8.8 (L) 07/04/2023 1028   CALCIUM 10.4 (H) 02/19/2021 1229   AST 20 07/04/2023 1028   AST 18 01/24/2013 0819   ALT 9 07/04/2023 1028   ALT 15 01/24/2013 0819   ALKPHOS 72 07/04/2023 1028   ALKPHOS 74 01/24/2013 0819   BILITOT 0.6 07/04/2023 1028   BILITOT 0.4 02/24/2021 1014   BILITOT 0.5 01/24/2013 0819   PROT 6.2 (L) 07/04/2023 1028   PROT 6.2 02/24/2021 1014   PROT 6.3 (L) 01/24/2013 0819   ALBUMIN 3.9 07/04/2023 1028   ALBUMIN 4.5 02/24/2021 1014   ALBUMIN 3.6 01/24/2013 0819     Present during today's visit: Patient and his wife Jack [name]  Assessment and Plan: CBC/CMP reviewed with patient, okay to continue with lenalidomide 20mg , 21 days on/7off and dexamethasone 20mg  weekly Light chains pending will continue to monitor trending in kappa Patient will restart Xgeva pending dental clearance Pain: tolerable Overall patient is doing well, he is noticeably less fatigued    Oral Chemotherapy Adherence: no reported missed doses.  No patient barriers to medication adherence identified.    New medications: none reported  Medication Access Issues: no issues, fills Revlimid at [facility]  Patient expressed understanding and was in agreement with this plan. He also understands that He can call clinic at any time with any questions, concerns, or complaints.   Follow-up plan: RTC in 4 weeks  Thank you for allowing me to participate in the care of this very pleasant patient.   Time Total: 10 mins  Visit consisted of counseling and education on dealing with issues of symptom management in the setting of serious and potentially life-threatening illness.Greater than 50%  of this time was spent counseling and coordinating care related to the above assessment and plan.  Signed by: Remi Haggard, PharmD, BCPS, Nolon Bussing, CPP Hematology/Oncology Clinical Pharmacist Practitioner [place]/[facility] Oral Chemotherapy [facility] [phone]  08/15/2023 10:30 AM

## 2023-08-15 NOTE — Telephone Encounter (Signed)
*  STAT* If patient is at the pharmacy, call can be transferred to refill team.   1. Which medications need to be refilled? (please list name of each medication and dose if known) omeprazole (PRILOSEC) 20 MG capsule     4. Which pharmacy/location (including street and city if local pharmacy) is medication to be sent to? [id number] [address], [place] [address] Phone: [phone]  Fax: [phone]       5. Do they need a 30 day or 90 day supply? 90

## 2023-08-15 NOTE — Progress Notes (Signed)
Wife is asking about xgeva injection.

## 2023-08-16 LAB — KAPPA/LAMBDA LIGHT CHAINS
Kappa free light chain: 205.9 mg/L — ABNORMAL HIGH (ref 3.3–19.4)
Kappa, lambda light chain ratio: 12.4 — ABNORMAL HIGH (ref 0.26–1.65)
Lambda free light chains: 16.6 mg/L (ref 5.7–26.3)

## 2023-08-16 NOTE — Telephone Encounter (Signed)
The patient's wife was made aware of the MD's recommendations and stated that the patient does not have a follow-up appointment with the PCP until April. She inquired if Dr. Okey Dupre could fill the prescription until the patient's appointment with the PCP.

## 2023-08-22 ENCOUNTER — Other Ambulatory Visit: Payer: Self-pay | Admitting: *Deleted

## 2023-08-22 DIAGNOSIS — C9000 Multiple myeloma not having achieved remission: Secondary | ICD-10-CM

## 2023-08-22 MED ORDER — LENALIDOMIDE 20 MG PO CAPS: 20.0000 mg | ORAL_CAPSULE | Freq: Every day | ORAL | 0 refills | Status: DC

## 2023-08-23 DIAGNOSIS — D485 Neoplasm of uncertain behavior of skin: Secondary | ICD-10-CM | POA: Diagnosis not present

## 2023-08-23 DIAGNOSIS — C44219 Basal cell carcinoma of skin of left ear and external auricular canal: Secondary | ICD-10-CM | POA: Diagnosis not present

## 2023-08-23 DIAGNOSIS — L570 Actinic keratosis: Secondary | ICD-10-CM | POA: Diagnosis not present

## 2023-08-23 DIAGNOSIS — L905 Scar conditions and fibrosis of skin: Secondary | ICD-10-CM | POA: Diagnosis not present

## 2023-08-30 ENCOUNTER — Other Ambulatory Visit: Payer: Self-pay | Admitting: *Deleted

## 2023-08-30 ENCOUNTER — Encounter: Payer: Self-pay | Admitting: Oncology

## 2023-08-30 DIAGNOSIS — H353231 Exudative age-related macular degeneration, bilateral, with active choroidal neovascularization: Secondary | ICD-10-CM | POA: Diagnosis not present

## 2023-08-30 MED ORDER — OXYCODONE HCL 10 MG PO TABS: 10.0000 mg | ORAL_TABLET | ORAL | 0 refills | Status: DC | PRN

## 2023-08-30 MED ORDER — MORPHINE SULFATE ER 30 MG PO TBCR: 30.0000 mg | EXTENDED_RELEASE_TABLET | Freq: Three times a day (TID) | ORAL | 0 refills | Status: DC

## 2023-09-02 ENCOUNTER — Inpatient Hospital Stay: Payer: PPO | Admitting: Hospice and Palliative Medicine

## 2023-09-06 DIAGNOSIS — J9601 Acute respiratory failure with hypoxia: Secondary | ICD-10-CM | POA: Diagnosis not present

## 2023-09-06 DIAGNOSIS — R2689 Other abnormalities of gait and mobility: Secondary | ICD-10-CM | POA: Diagnosis not present

## 2023-09-06 DIAGNOSIS — S32592D Other specified fracture of left pubis, subsequent encounter for fracture with routine healing: Secondary | ICD-10-CM | POA: Diagnosis not present

## 2023-09-06 DIAGNOSIS — M6281 Muscle weakness (generalized): Secondary | ICD-10-CM | POA: Diagnosis not present

## 2023-09-06 DIAGNOSIS — C9000 Multiple myeloma not having achieved remission: Secondary | ICD-10-CM | POA: Diagnosis not present

## 2023-09-08 DIAGNOSIS — C9000 Multiple myeloma not having achieved remission: Secondary | ICD-10-CM | POA: Diagnosis not present

## 2023-09-08 DIAGNOSIS — Z515 Encounter for palliative care: Secondary | ICD-10-CM | POA: Diagnosis not present

## 2023-09-08 DIAGNOSIS — D692 Other nonthrombocytopenic purpura: Secondary | ICD-10-CM | POA: Diagnosis not present

## 2023-09-12 ENCOUNTER — Inpatient Hospital Stay: Payer: PPO

## 2023-09-12 ENCOUNTER — Encounter: Payer: Self-pay | Admitting: Oncology

## 2023-09-12 ENCOUNTER — Other Ambulatory Visit: Payer: Self-pay

## 2023-09-12 ENCOUNTER — Inpatient Hospital Stay: Payer: PPO | Admitting: Pharmacist

## 2023-09-12 VITALS — BP 126/66 | HR 53 | Temp 98.4°F | Resp 18

## 2023-09-12 DIAGNOSIS — C9000 Multiple myeloma not having achieved remission: Secondary | ICD-10-CM

## 2023-09-12 LAB — CBC WITH DIFFERENTIAL/PLATELET
Abs Immature Granulocytes: 0.03 10*3/uL (ref 0.00–0.07)
Basophils Absolute: 0 10*3/uL (ref 0.0–0.1)
Basophils Relative: 1 %
Eosinophils Absolute: 0.3 10*3/uL (ref 0.0–0.5)
Eosinophils Relative: 7 %
HCT: 39.2 % (ref 39.0–52.0)
Hemoglobin: 12.7 g/dL — ABNORMAL LOW (ref 13.0–17.0)
Immature Granulocytes: 1 %
Lymphocytes Relative: 22 %
Lymphs Abs: 1 10*3/uL (ref 0.7–4.0)
MCH: 31.9 pg (ref 26.0–34.0)
MCHC: 32.4 g/dL (ref 30.0–36.0)
MCV: 98.5 fL (ref 80.0–100.0)
Monocytes Absolute: 0.6 10*3/uL (ref 0.1–1.0)
Monocytes Relative: 13 %
Neutro Abs: 2.5 10*3/uL (ref 1.7–7.7)
Neutrophils Relative %: 56 %
Platelets: 75 10*3/uL — ABNORMAL LOW (ref 150–400)
RBC: 3.98 MIL/uL — ABNORMAL LOW (ref 4.22–5.81)
RDW: 16.6 % — ABNORMAL HIGH (ref 11.5–15.5)
WBC: 4.4 10*3/uL (ref 4.0–10.5)
nRBC: 0 % (ref 0.0–0.2)

## 2023-09-12 LAB — COMPREHENSIVE METABOLIC PANEL
ALT: 10 U/L (ref 0–44)
AST: 16 U/L (ref 15–41)
Albumin: 3.4 g/dL — ABNORMAL LOW (ref 3.5–5.0)
Alkaline Phosphatase: 74 U/L (ref 38–126)
Anion gap: 9 (ref 5–15)
BUN: 23 mg/dL (ref 8–23)
CO2: 27 mmol/L (ref 22–32)
Calcium: 8.8 mg/dL — ABNORMAL LOW (ref 8.9–10.3)
Chloride: 101 mmol/L (ref 98–111)
Creatinine, Ser: 1.37 mg/dL — ABNORMAL HIGH (ref 0.61–1.24)
GFR, Estimated: 50 mL/min — ABNORMAL LOW (ref >60)
Glucose, Bld: 129 mg/dL — ABNORMAL HIGH (ref 70–99)
Potassium: 3.9 mmol/L (ref 3.5–5.1)
Sodium: 137 mmol/L (ref 135–145)
Total Bilirubin: 0.6 mg/dL (ref <1.2)
Total Protein: 5.9 g/dL — ABNORMAL LOW (ref 6.5–8.1)

## 2023-09-12 NOTE — Progress Notes (Signed)
[facility]  [phone] [phone]  Patient Care Team: Dorothey Baseman, MD as PCP - General (Family Medicine) End, Cristal Deer, MD as PCP - Cardiology (Cardiology) Quentin Cornwall, MD (Endocrinology) Jeralyn Ruths, MD as Consulting Physician (Hematology and Oncology) Rodney Langton, RN as [facility] Care Management   Name of the patient: Jack [name]  [id number]  1933/09/03   Date of visit: 09/12/23  HPI: Patient is a 89 y.o. male with newly diagnosed multiple myeloma. Currently treated with Revlimid (lenalidomide) and dexamethasone. Patient was initiated on an all oral regimen because he was unable to physically make it into clinic at the time treatment due to his disease/performance status. His status improved he was able to come back to inperson appts on 07/14/21.  Reason for Consult: Oral chemotherapy follow-up for lenalidomide therapy.   PAST MEDICAL HISTORY: Past Medical History:  Diagnosis Date   Ascending aortic aneurysm (HCC)    a. 12/2020 Echo: Asc Ao 48mm, Ao root 40mm. b. 03/2022 Asc Ao 4.6 cm (4.5 cm in 2019)   CKD (chronic kidney disease), stage III (HCC)    Compression fracture of body of thoracic vertebra (HCC)    Diastolic dysfunction    a. 12/2020 Echo: EF 50-55%, no rwma, mild LVH, GrI DD, nl RV fxn, mild AI. Asc Ao 48mm, Ao root 40mm.   Elevated prostate specific antigen (PSA)    has been 7 for a year    GERD (gastroesophageal reflux disease)    History of colon polyps 2008   [facility] [place],    History of kidney stones    Hyperlipidemia    Hypertension    Myeloma (HCC)    PAF (paroxysmal atrial fibrillation) (HCC)    a. 01/2021-->Eliquis (CHA2DS2VASc = 3-4).   Pain    Prostate hypertrophy    diagnosed at age 46 due to hematospermia    HEMATOLOGY/ONCOLOGY HISTORY:  Oncology History  Kappa light chain myeloma (HCC)  03/12/2021 Initial Diagnosis   Kappa light chain  myeloma (HCC)   03/27/2021 - 04/06/2021 Chemotherapy   Patient is on Treatment Plan : MYELOMA NON-TRANSPLANT CANDIDATES VRd SQ q21d      04/16/2021 Cancer Staging   Staging form: Plasma Cell Myeloma and Plasma Cell Disorders, AJCC 8th Edition - Clinical stage from 04/16/2021: Albumin (g/dL): 3.8, ISS: Stage II, High-risk cytogenetics: Absent, LDH: Unknown - Signed by Jeralyn Ruths, MD on 04/16/2021 Albumin range (g/dL): Greater than or equal to 3.5 Cytogenetics: t(11;14) translocation Serum calcium level: Normal Serum creatinine level: Normal Bone disease on imaging: Present     ALLERGIES:  is allergic to quinolones, amlodipine, azithromycin, lipitor [atorvastatin], lisinopril, zetia [ezetimibe], and hydromorphone hcl.  MEDICATIONS:  Current Outpatient Medications  Medication Sig Dispense Refill   Ascorbic Acid (VITAMIN C) 1000 MG tablet Take 1,000 mg by mouth in the morning and at bedtime.     Calcium Carb-Cholecalciferol (OYSTER SHELL CALCIUM W/D) 500-5 MG-MCG TABS Take 1 tablet by mouth in the morning and at bedtime.     carvedilol (COREG) 12.5 MG tablet Take 0.5 tablets (6.25 mg total) by mouth 2 (two) times daily. 180 tablet 3   dexamethasone (DECADRON) 4 MG tablet Take 5 tablets (20 mg total) by mouth once a week. 60 tablet 2   ELIQUIS 5 MG TABS tablet TAKE 1 TABLET BY MOUTH TWICE A DAY 180 tablet 0   feeding supplement (ENSURE ENLIVE / ENSURE PLUS) LIQD Take 237 mLs by mouth 3 (three) times daily between  meals. (Patient taking differently: Take 237 mLs by mouth daily with lunch.) 237 mL 12   gabapentin (NEURONTIN) 300 MG capsule TAKE 2 CAPSULES BY MOUTH 3 TIMES DAILY. 180 capsule 2   ibuprofen (ADVIL) 200 MG tablet Take 200 mg by mouth 2 (two) times daily as needed.     lenalidomide (REVLIMID) 20 MG capsule Take 1 capsule (20 mg total) by mouth daily. Take for 21 days, then hold for 7 days. Repeat every 28 days. 21 capsule 0   lidocaine (XYLOCAINE) 5 % ointment APPLY TOPICALLY 3  (THREE) TIMES DAILY AS NEEDED FOR MILD PAIN OR MODERATE PAIN. 50 g 2   morphine (MS CONTIN) 30 MG 12 hr tablet Take 1 tablet (30 mg total) by mouth every 8 (eight) hours. 90 tablet 0   Multiple Vitamin (MULTIVITAMIN WITH MINERALS) TABS tablet Take 1 tablet by mouth daily.     Multiple Vitamins-Minerals (PRESERVISION AREDS PO) Take 1 capsule by mouth in the morning and at bedtime.     omeprazole (PRILOSEC) 20 MG capsule TAKE 1 CAPSULE BY MOUTH EVERY DAY 90 capsule 2   Oxycodone HCl 10 MG TABS Take 1 tablet (10 mg total) by mouth every 4 (four) hours as needed (pain). 90 tablet 0   polyethylene glycol (MIRALAX / GLYCOLAX) 17 g packet Take 17 g by mouth 2 (two) times daily.  0   potassium citrate (UROCIT-K) 10 MEQ (1080 MG) SR tablet TAKE 1 TABLET (10 MEQ TOTAL) BY MOUTH 3 (THREE) TIMES DAILY WITH MEALS. 270 tablet 0   senna-docusate (SENOKOT-S) 8.6-50 MG tablet Take 1 tablet by mouth 2 (two) times daily. 30 tablet 0   naloxone (NARCAN) nasal spray 4 mg/0.1 mL SPRAY 1 SPRAY INTO ONE NOSTRIL AS DIRECTED FOR OPIOID OVERDOSE (TURN PERSON ON SIDE AFTER DOSE. IF NO RESPONSE IN 2-3 MINUTES OR PERSON RESPONDS BUT RELAPSES, REPEAT USING A NEW SPRAY DEVICE AND SPRAY INTO THE OTHER NOSTRIL. CALL 911 AFTER USE.) * EMERGENCY USE ONLY * (Patient not taking: Reported on 09/12/2023) 1 each 0   No current facility-administered medications for this visit.    VITAL SIGNS: BP 126/66 (BP Location: Left Arm, Patient Position: Sitting, Cuff Size: Normal)   Pulse (!) 53   Temp 98.4 F (36.9 C) (Oral)   Resp 18  There were no vitals filed for this visit.  Estimated body mass index is 27.17 kg/m as calculated from the following:   Height as of 08/15/23: 5\' 7"  (1.702 m).   Weight as of 08/15/23: 78.7 kg (173 lb 8 oz).  LABS: CBC:    Component Value Date/Time   WBC 4.4 09/12/2023 0934   HGB 12.7 (L) 09/12/2023 0934   HGB 11.0 (L) 02/24/2021 1014   HCT 39.2 09/12/2023 0934   HCT 35.3 (L) 02/24/2021 1014   PLT 75  (L) 09/12/2023 0934   PLT 249 02/24/2021 1014   MCV 98.5 09/12/2023 0934   MCV 96 02/24/2021 1014   MCV 90 01/24/2013 0819   NEUTROABS 2.5 09/12/2023 0934   NEUTROABS 7.4 (H) 01/24/2013 0819   LYMPHSABS 1.0 09/12/2023 0934   LYMPHSABS 1.2 01/24/2013 0819   MONOABS 0.6 09/12/2023 0934   MONOABS 0.3 01/24/2013 0819   EOSABS 0.3 09/12/2023 0934   EOSABS 0.0 01/24/2013 0819   BASOSABS 0.0 09/12/2023 0934   BASOSABS 0.0 01/24/2013 0819   Comprehensive Metabolic Panel:    Component Value Date/Time   NA 137 09/12/2023 0934   NA 136 02/24/2021 1014   NA 138 01/24/2013 0819  K 3.9 09/12/2023 0934   K 3.8 01/24/2013 0819   CL 101 09/12/2023 0934   CL 105 01/24/2013 0819   CO2 27 09/12/2023 0934   CO2 25 01/24/2013 0819   BUN 23 09/12/2023 0934   BUN 23 02/24/2021 1014   BUN 20 (H) 01/24/2013 0819   CREATININE 1.37 (H) 09/12/2023 0934   CREATININE 1.52 (H) 01/24/2013 0819   GLUCOSE 129 (H) 09/12/2023 0934   GLUCOSE 171 (H) 01/24/2013 0819   CALCIUM 8.8 (L) 09/12/2023 0934   CALCIUM 10.4 (H) 02/19/2021 1229   AST 16 09/12/2023 0934   AST 18 01/24/2013 0819   ALT 10 09/12/2023 0934   ALT 15 01/24/2013 0819   ALKPHOS 74 09/12/2023 0934   ALKPHOS 74 01/24/2013 0819   BILITOT 0.6 09/12/2023 0934   BILITOT 0.4 02/24/2021 1014   BILITOT 0.5 01/24/2013 0819   PROT 5.9 (L) 09/12/2023 0934   PROT 6.2 02/24/2021 1014   PROT 6.3 (L) 01/24/2013 0819   ALBUMIN 3.4 (L) 09/12/2023 0934   ALBUMIN 4.5 02/24/2021 1014   ALBUMIN 3.6 01/24/2013 0819     Present during today's visit: Patient and his [name]  Assessment and Plan: CBC/CMP reviewed with the [facility], okay to continue with lenalidomide 20mg , 21 days on/7off and dexamethasone 20mg  weekly Slight bump in SCr, will continue to monitor, encouraged patient to increase hydration Light chains pending will continue to monitor trending in kappa Patient will restart Xgeva pending dental clearance Pain: tolerable. Managed well with  current pain regimen Overall patient is doing well, he has occasional days of feeling tired but after resting on those tried days he feels better   Oral Chemotherapy Adherence: no reported missed doses.  No patient barriers to medication adherence identified.   New medications: none reported  Medication Access Issues: no issues, fills Revlimid at [facility]  Patient expressed understanding and was in agreement with this plan. He also understands that He can call clinic at any time with any questions, concerns, or complaints.   Follow-up plan: RTC in 4 weeks  Thank you for allowing me to participate in the care of this very pleasant patient.   Time Total: 15 mins  Visit consisted of counseling and education on dealing with issues of symptom management in the setting of serious and potentially life-threatening illness.Greater than 50%  of this time was spent counseling and coordinating care related to the above assessment and plan.  Signed by: Remi Haggard, PharmD, BCPS, Nolon Bussing, CPP Hematology/Oncology Clinical Pharmacist Practitioner /DB/AP Oral Chemotherapy Navigation Clinic [phone]  09/12/2023 10:35 AM

## 2023-09-13 LAB — KAPPA/LAMBDA LIGHT CHAINS
Kappa free light chain: 203.8 mg/L — ABNORMAL HIGH (ref 3.3–19.4)
Kappa, lambda light chain ratio: 9.1 — ABNORMAL HIGH (ref 0.26–1.65)
Lambda free light chains: 22.4 mg/L (ref 5.7–26.3)

## 2023-09-19 ENCOUNTER — Other Ambulatory Visit: Payer: Self-pay | Admitting: *Deleted

## 2023-09-19 DIAGNOSIS — C9000 Multiple myeloma not having achieved remission: Secondary | ICD-10-CM

## 2023-09-19 MED ORDER — LENALIDOMIDE 20 MG PO CAPS: 20.0000 mg | ORAL_CAPSULE | Freq: Every day | ORAL | 0 refills | Status: DC

## 2023-09-21 ENCOUNTER — Ambulatory Visit: Payer: Self-pay | Admitting: *Deleted

## 2023-09-21 ENCOUNTER — Other Ambulatory Visit: Payer: Self-pay | Admitting: Hospice and Palliative Medicine

## 2023-09-21 DIAGNOSIS — C9000 Multiple myeloma not having achieved remission: Secondary | ICD-10-CM

## 2023-09-21 NOTE — Patient Outreach (Signed)
  Care Coordination   Follow Up Visit Note   09/21/2023 Name: Jack [name] MRN: [id number] DOB: 07/26/1933  Jack [name] is a 89 y.o. year old male who sees Glover Lenis, MD for primary care. I spoke with  Jack [name] by phone today.  What matters to the patients health and wellness today?  Patient report he is feeling well in regards to the cancer and treatment.  Back pain is better, also being treated for neuropathy.      Goals Addressed             This Visit's Progress    Management of chronic conditions   On track    Interventions Today    Flowsheet Row Most Recent Value  Chronic Disease   Chronic disease during today's visit Other  [cancer, chronic pain]  General Interventions   General Interventions Discussed/Reviewed General Interventions Reviewed, Doctor Visits  [Per oncology, waiting on dental clearance to restart Xgeva ]  Doctor Visits Discussed/Reviewed Doctor Visits Reviewed, Specialist  [Dentist today, Oncology 1/27, neurology 2/19]  PCP/Specialist Visits Compliance with follow-up visit  Exercise Interventions   Exercise Discussed/Reviewed Physical Activity  Physical Activity Discussed/Reviewed Physical Activity Reviewed  [Now active with [facility] for better pain management]  Education Interventions   Education Provided Provided Education  Provided Verbal Education On Medication, When to see the doctor                SDOH assessments and interventions completed:  No     Care Coordination Interventions:  Yes, provided   Follow up plan: Follow up call scheduled for 3/12    Encounter Outcome:  Patient Visit Completed   Odella Ku, RN, MSN, CCM   [facility], [facility] RN Care Coordinator Direct Dial: [phone] / Main [phone] Fax [phone] Email: [email or web address]

## 2023-09-22 IMAGING — MR MR ABDOMEN WO/W CM
18 series · 48 of 48 positions shown · IV contrast (gadavist)
Comparison: Comparison is made with March 26, 2021.

CLINICAL DATA: History of multiple myeloma in this 87-year-old male
with therapy on going. Question of mass along the LEFT flank.

EXAM:
MRI ABDOMEN WITHOUT AND WITH CONTRAST
TECHNIQUE: Multiplanar multisequence MR imaging of the abdomen was performed
both before and after the administration of intravenous contrast.
CONTRAST:  7.5mL GADAVIST GADOBUTROL 1 MMOL/ML IV SOLN

[Series 3: T2 · coronal · 6.0mm · 1.38mm/px · 2 of 35 slices shown (1 of 2)]
[im 1/35]
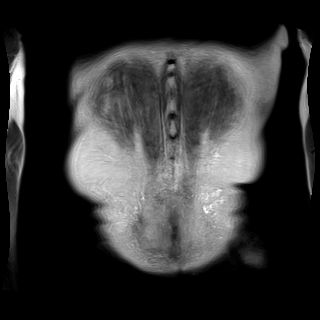
[im 35/35]
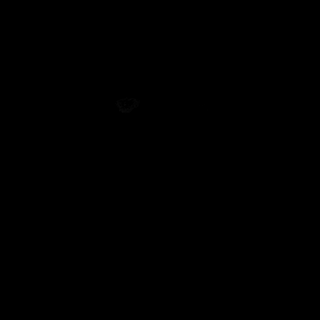

[Series 4: T2 · axial · 6.0mm · 1.31mm/px · z∈[-176,+162]mm · 2 of 48 slices shown (2 of 2)]
[im 1/48]
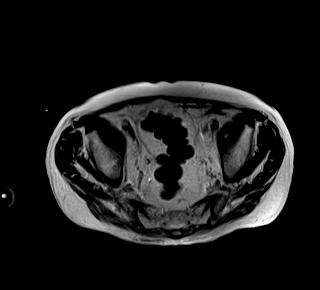
[im 48/48]
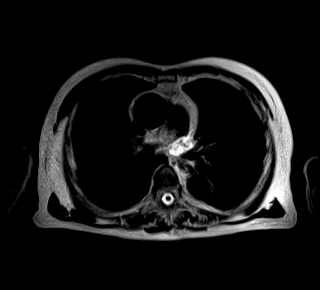

[Series 5: bSSFP · axial · 6.0mm · 0.82mm/px · z∈[-176,+162]mm · 2 of 48 slices shown]
[im 1/48]
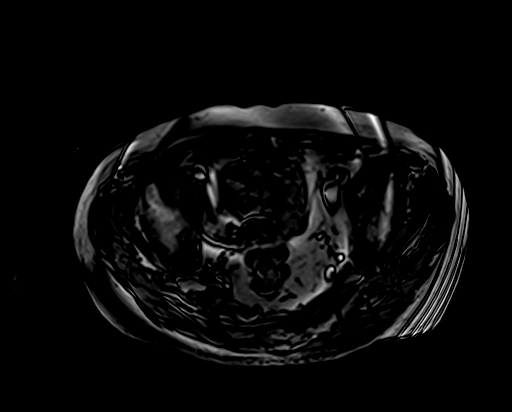
[im 48/48]
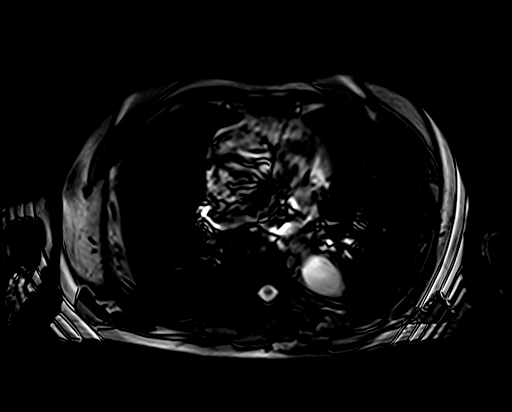

[Series 6: in & out · axial · 3.0mm · 1.31mm/px · z∈[-173,+160]mm · 4 of 112 slices shown (1 of 2)]
[im 1/112]
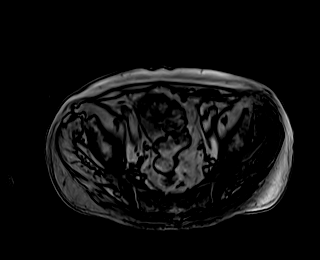
[im 38/112]
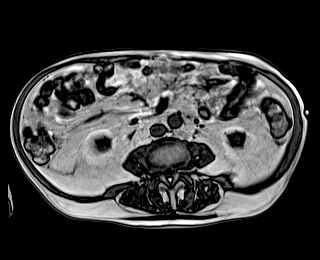
[im 75/112]
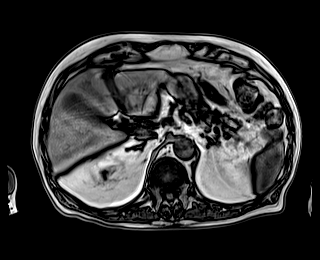
[im 112/112]
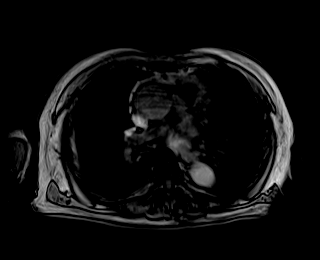

[Series 7: in & out · axial · 3.0mm · 1.31mm/px · z∈[-173,+160]mm · 3 of 112 slices shown (2 of 2)]
[im 1/112]
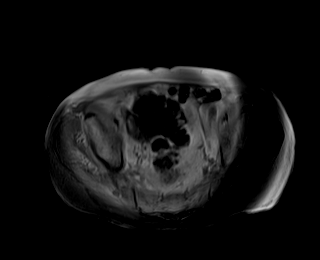
[im 56/112]
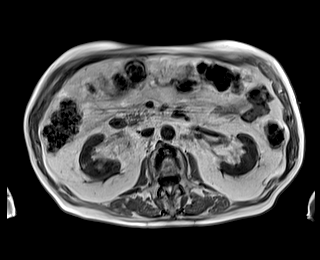
[im 112/112]
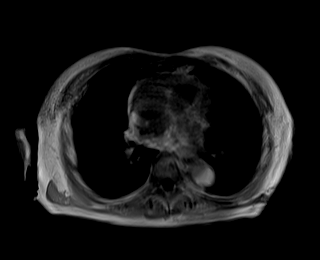

[Series 8: T2 fat-sat · axial · 6.0mm · 1.31mm/px · 1 of 48 slices shown]
[im 1/48]
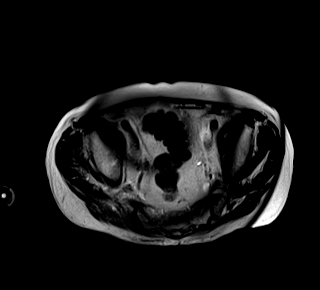

[Series 9: ax dwi_tracew · axial · 6.0mm · 1.57mm/px · z∈[-183,+169]mm · 4 of 150 slices shown]
[im 1/150]
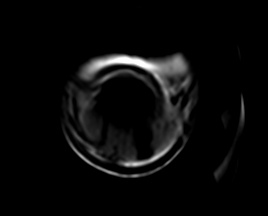
[im 50/150]
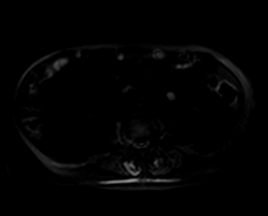
[im 100/150]
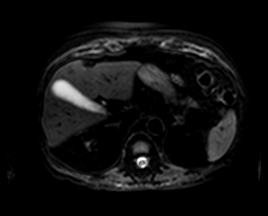
[im 150/150]
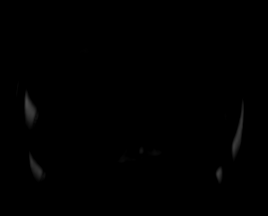

[Series 10: ax dwi_adc · axial · 6.0mm · 1.57mm/px · 1 of 50 slices shown]
[im 1/50]
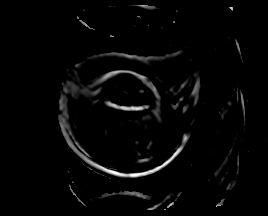

[Series 12: T1 dynamic fat-sat · axial · 3.0mm · 1.31mm/px · z∈[-173,+160]mm · 3 of 112 slices shown (1 of 9)]
[im 1/112]
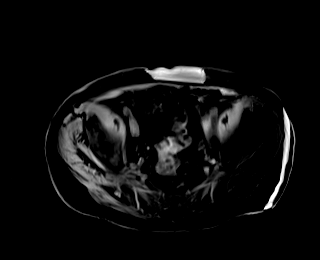
[im 56/112]
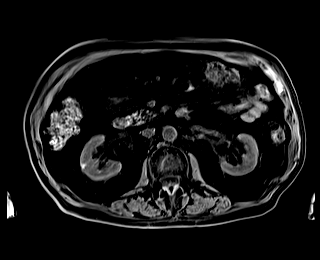
[im 112/112]
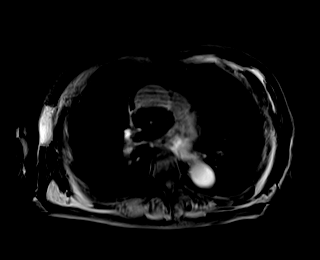

[Series 15: T1 dynamic fat-sat · axial · 3.0mm · 1.31mm/px · z∈[-173,+160]mm · 3 of 112 slices shown (2 of 9)]
[im 1/112]
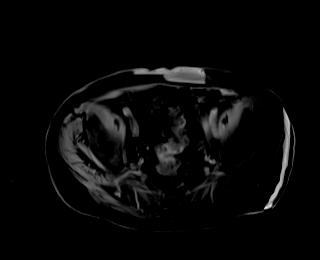
[im 56/112]
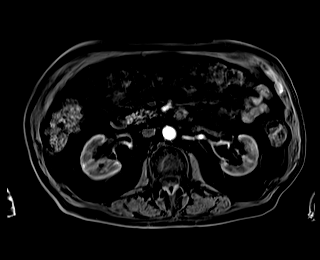
[im 112/112]
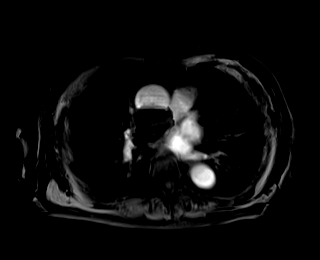

[Series 16: T1 dynamic fat-sat · axial · 3.0mm · 1.31mm/px · z∈[-173,+160]mm · 3 of 112 slices shown (3 of 9)]
[im 1/112]
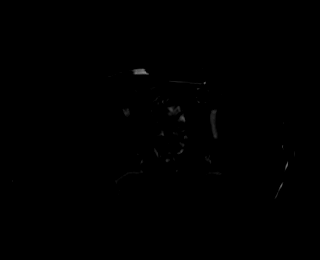
[im 56/112]
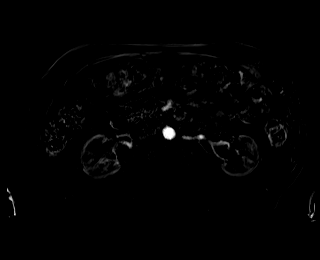
[im 112/112]
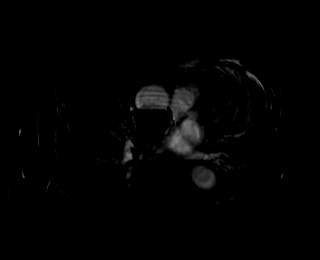

[Series 19: T1 dynamic fat-sat · axial · 3.0mm · 1.31mm/px · z∈[-173,+160]mm · 3 of 112 slices shown (4 of 9)]
[im 1/112]
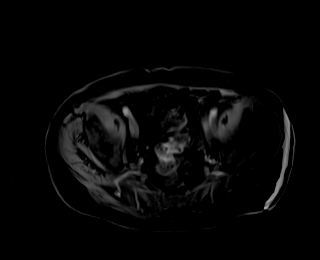
[im 56/112]
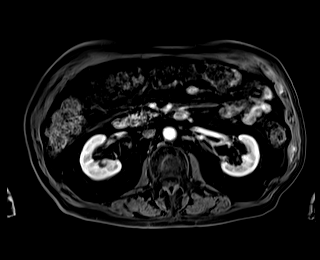
[im 112/112]
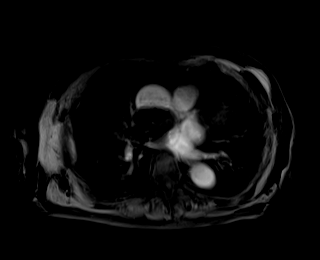

[Series 20: T1 dynamic fat-sat · axial · 3.0mm · 1.31mm/px · z∈[-173,+160]mm · 3 of 112 slices shown (5 of 9)]
[im 1/112]
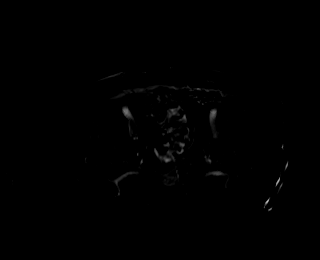
[im 56/112]
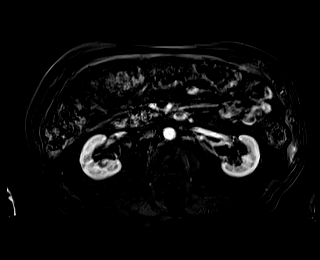
[im 112/112]
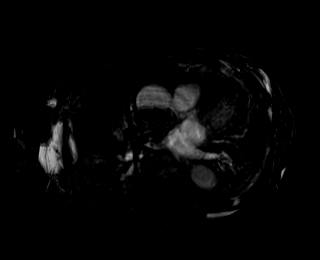

[Series 23: T1 dynamic fat-sat · axial · 3.0mm · 1.31mm/px · z∈[-173,+160]mm · 3 of 112 slices shown (6 of 9)]
[im 1/112]
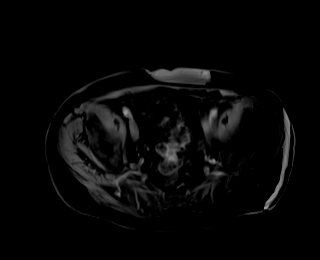
[im 56/112]
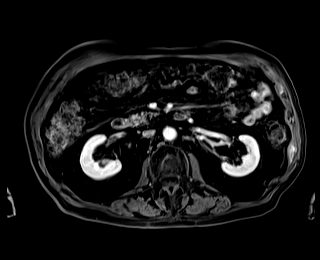
[im 112/112]
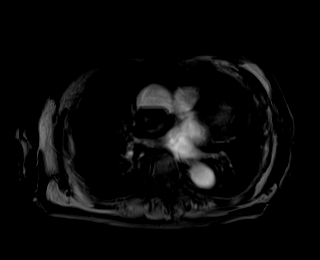

[Series 24: T1 dynamic fat-sat · axial · 3.0mm · 1.31mm/px · z∈[-173,+160]mm · 3 of 112 slices shown (7 of 9)]
[im 1/112]
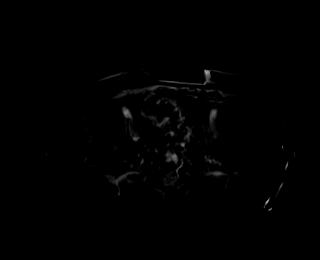
[im 56/112]
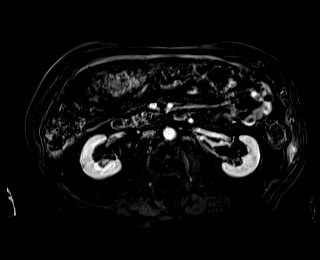
[im 112/112]
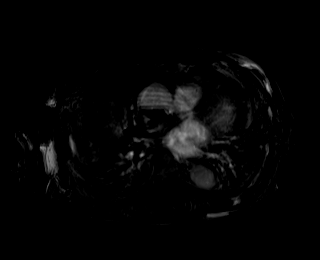

[Series 25: T1 dynamic post-contrast · coronal · 3.0mm · 1.31mm/px · 2 of 88 slices shown]
[im 1/88]
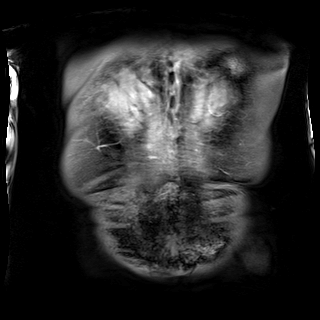
[im 88/88]
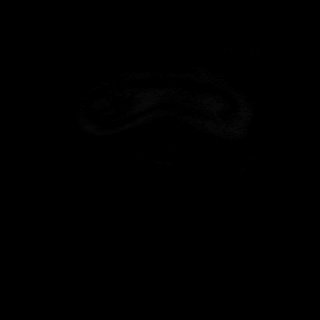

[Series 28: T1 dynamic fat-sat · axial · 3.0mm · 1.31mm/px · z∈[-173,+160]mm · 3 of 112 slices shown (8 of 9)]
[im 1/112]
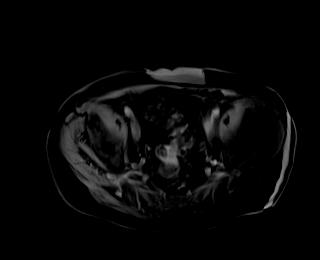
[im 56/112]
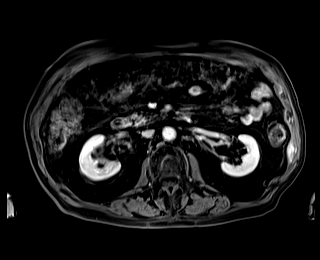
[im 112/112]
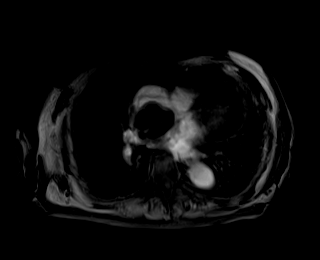

[Series 29: T1 dynamic fat-sat · axial · 3.0mm · 1.31mm/px · z∈[-173,+160]mm · 3 of 112 slices shown (9 of 9)]
[im 1/112]
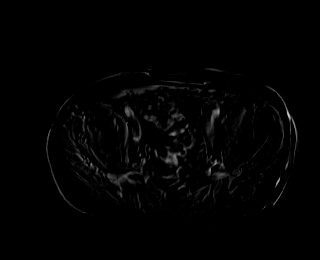
[im 56/112]
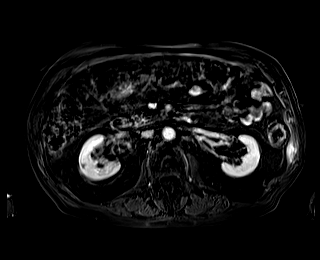
[im 112/112]
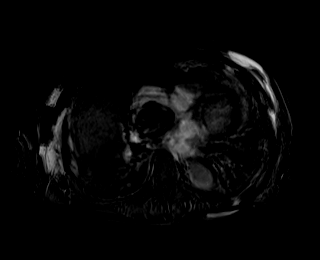

[48 of 48 positions shown; findings below may reference images not displayed]

FINDINGS: Lower chest: No effusion. No consolidative process. Limited
assessment of the lung bases on abdominal MRI.

Hepatobiliary: No focal, suspicious hepatic lesion. No
pericholecystic stranding or biliary duct dilation. Portal vein is
patent.

Pancreas: Mild pancreatic atrophy without visible lesion or sign of
inflammation.

Spleen:  Normal.

Adrenals/Urinary Tract:  Adrenal glands are normal.

Symmetric renal enhancement. No hydronephrosis. Mild parenchymal
thinning. No suspicious renal lesion.

Stomach/Bowel: Small hiatal hernia, otherwise unremarkable to the
extent evaluated on abdominal MRI.

Vascular/Lymphatic: Atheromatous plaque in the abdominal aorta
without aneurysmal dilation. Smooth contour of the aorta and the
IVC. There is no gastrohepatic or hepatoduodenal ligament
lymphadenopathy. No retroperitoneal or mesenteric lymphadenopathy.

Other:  No ascites.

Musculoskeletal: Compression fractures are noted in the spine, in
the lumbar spine and lower thoracic spine. These are similar to
previous imaging.

No visible mass to correspond to areas marked along the LEFT flank
and reported flank abnormality.

Areas of enhancement along lower LEFT lateral ribs bilaterally
presumably related to rib fractures in these locations. No masslike
characteristics, LEFT posterior and lateral eleventh rib with more
pronounced T2 signal may indicate more recent fracture (image [DATE]).
IMPRESSION: No visible mass to correspond to areas of marked along the LEFT
flank and reported flank abnormality.

Signs of bilateral presumed rib fractures in this patient with
reported history of myeloma, LEFT lateral eleventh rib with some
mild surrounding edema and more pronounced enhancement and other
areas, correlate with any history of recent trauma or focal
tenderness in this area, favored to represent more recent rib
fracture.

No acute intra-abdominal findings.

Signs of compression fractures similar to prior imaging.

Small hiatal hernia.

## 2023-10-02 ENCOUNTER — Encounter: Payer: Self-pay | Admitting: Oncology

## 2023-10-03 ENCOUNTER — Other Ambulatory Visit: Payer: Self-pay | Admitting: *Deleted

## 2023-10-03 MED ORDER — OXYCODONE HCL 10 MG PO TABS: 10.0000 mg | ORAL_TABLET | ORAL | 0 refills | Status: DC | PRN

## 2023-10-03 MED ORDER — MORPHINE SULFATE ER 30 MG PO TBCR: 30.0000 mg | EXTENDED_RELEASE_TABLET | Freq: Three times a day (TID) | ORAL | 0 refills | Status: DC

## 2023-10-07 ENCOUNTER — Other Ambulatory Visit: Payer: Self-pay | Admitting: *Deleted

## 2023-10-07 DIAGNOSIS — R2689 Other abnormalities of gait and mobility: Secondary | ICD-10-CM | POA: Diagnosis not present

## 2023-10-07 DIAGNOSIS — M6281 Muscle weakness (generalized): Secondary | ICD-10-CM | POA: Diagnosis not present

## 2023-10-07 DIAGNOSIS — C9000 Multiple myeloma not having achieved remission: Secondary | ICD-10-CM | POA: Diagnosis not present

## 2023-10-07 DIAGNOSIS — J9601 Acute respiratory failure with hypoxia: Secondary | ICD-10-CM | POA: Diagnosis not present

## 2023-10-07 DIAGNOSIS — C4491 Basal cell carcinoma of skin, unspecified: Secondary | ICD-10-CM | POA: Diagnosis not present

## 2023-10-07 DIAGNOSIS — S32592D Other specified fracture of left pubis, subsequent encounter for fracture with routine healing: Secondary | ICD-10-CM | POA: Diagnosis not present

## 2023-10-07 DIAGNOSIS — C44229 Squamous cell carcinoma of skin of left ear and external auricular canal: Secondary | ICD-10-CM | POA: Diagnosis not present

## 2023-10-10 ENCOUNTER — Encounter: Payer: Self-pay | Admitting: Oncology

## 2023-10-10 ENCOUNTER — Inpatient Hospital Stay: Payer: PPO | Attending: Oncology

## 2023-10-10 ENCOUNTER — Inpatient Hospital Stay: Payer: PPO | Admitting: Oncology

## 2023-10-10 ENCOUNTER — Other Ambulatory Visit: Payer: Self-pay

## 2023-10-10 VITALS — BP 115/67 | HR 53 | Temp 99.1°F | Resp 16 | Ht 67.0 in | Wt 176.5 lb

## 2023-10-10 DIAGNOSIS — C9000 Multiple myeloma not having achieved remission: Secondary | ICD-10-CM | POA: Insufficient documentation

## 2023-10-10 DIAGNOSIS — M899 Disorder of bone, unspecified: Secondary | ICD-10-CM | POA: Insufficient documentation

## 2023-10-10 DIAGNOSIS — Z87891 Personal history of nicotine dependence: Secondary | ICD-10-CM | POA: Insufficient documentation

## 2023-10-10 DIAGNOSIS — M549 Dorsalgia, unspecified: Secondary | ICD-10-CM | POA: Insufficient documentation

## 2023-10-10 DIAGNOSIS — Z79899 Other long term (current) drug therapy: Secondary | ICD-10-CM | POA: Diagnosis not present

## 2023-10-10 DIAGNOSIS — D649 Anemia, unspecified: Secondary | ICD-10-CM | POA: Insufficient documentation

## 2023-10-10 DIAGNOSIS — D696 Thrombocytopenia, unspecified: Secondary | ICD-10-CM | POA: Diagnosis not present

## 2023-10-10 LAB — CMP (CANCER CENTER ONLY)
ALT: 10 U/L (ref 0–44)
AST: 20 U/L (ref 15–41)
Albumin: 3.7 g/dL (ref 3.5–5.0)
Alkaline Phosphatase: 84 U/L (ref 38–126)
Anion gap: 9 (ref 5–15)
BUN: 24 mg/dL — ABNORMAL HIGH (ref 8–23)
CO2: 28 mmol/L (ref 22–32)
Calcium: 8.8 mg/dL — ABNORMAL LOW (ref 8.9–10.3)
Chloride: 99 mmol/L (ref 98–111)
Creatinine: 1.18 mg/dL (ref 0.61–1.24)
GFR, Estimated: 59 mL/min — ABNORMAL LOW (ref >60)
Glucose, Bld: 130 mg/dL — ABNORMAL HIGH (ref 70–99)
Potassium: 4 mmol/L (ref 3.5–5.1)
Sodium: 136 mmol/L (ref 135–145)
Total Bilirubin: 0.9 mg/dL (ref 0.0–1.2)
Total Protein: 5.8 g/dL — ABNORMAL LOW (ref 6.5–8.1)

## 2023-10-10 LAB — CBC WITH DIFFERENTIAL (CANCER CENTER ONLY)
Abs Immature Granulocytes: 0.02 10*3/uL (ref 0.00–0.07)
Basophils Absolute: 0 10*3/uL (ref 0.0–0.1)
Basophils Relative: 0 %
Eosinophils Absolute: 0.2 10*3/uL (ref 0.0–0.5)
Eosinophils Relative: 3 %
HCT: 38.5 % — ABNORMAL LOW (ref 39.0–52.0)
Hemoglobin: 12.3 g/dL — ABNORMAL LOW (ref 13.0–17.0)
Immature Granulocytes: 0 %
Lymphocytes Relative: 14 %
Lymphs Abs: 0.7 10*3/uL (ref 0.7–4.0)
MCH: 32.4 pg (ref 26.0–34.0)
MCHC: 31.9 g/dL (ref 30.0–36.0)
MCV: 101.3 fL — ABNORMAL HIGH (ref 80.0–100.0)
Monocytes Absolute: 0.4 10*3/uL (ref 0.1–1.0)
Monocytes Relative: 8 %
Neutro Abs: 3.5 10*3/uL (ref 1.7–7.7)
Neutrophils Relative %: 75 %
Platelet Count: 63 10*3/uL — ABNORMAL LOW (ref 150–400)
RBC: 3.8 MIL/uL — ABNORMAL LOW (ref 4.22–5.81)
RDW: 17.2 % — ABNORMAL HIGH (ref 11.5–15.5)
WBC Count: 4.8 10*3/uL (ref 4.0–10.5)
nRBC: 0 % (ref 0.0–0.2)

## 2023-10-10 NOTE — Progress Notes (Signed)
Questions about xgeva. Whether to start back on it. Oral surgeon said that bones are showing in mouth/jaw area and he thinks it is related to xgeva.

## 2023-10-10 NOTE — Progress Notes (Signed)
[facility]  Telephone:([phone]) [phone] Fax:(336) [phone]  ID: Jack [name] OB: October 13, 1932  MR#: [id number]  QIO#:[id number]  Patient Care Team: Dorothey Baseman, MD as PCP - General (Family Medicine) End, Cristal Deer, MD as PCP - Cardiology (Cardiology) Quentin Cornwall, MD (Endocrinology) Jeralyn Ruths, MD as Consulting Physician (Hematology and Oncology) Rodney Langton, RN as [facility] Care Management   CHIEF COMPLAINT: Stage II kappa chain myeloma.  INTERVAL HISTORY: Patient returns to clinic today for repeat laboratory work and further evaluation.  He continues to take 20 mg of Revlimid and is tolerating treatment well without significant side effects.  His pain is well-controlled on his current narcotic regimen.  He has no neurologic complaints.  He denies any fevers.  He has a good appetite and denies weight loss.  He denies chest pain, shortness of breath, cough, or hemoptysis.  He denies any nausea, vomiting, constipation, or diarrhea.  He has no urinary complaints.  Patient offers no specific complaints today.  REVIEW OF SYSTEMS:   Review of Systems  Constitutional: Negative.  Negative for fever and malaise/fatigue.  Respiratory: Negative.  Negative for cough, hemoptysis and shortness of breath.   Cardiovascular: Negative.  Negative for chest pain and leg swelling.  Gastrointestinal: Negative.  Negative for abdominal pain.  Genitourinary: Negative.  Negative for dysuria and flank pain.  Musculoskeletal:  Positive for back pain. Negative for falls and joint pain.  Skin: Negative.  Negative for rash.  Neurological: Negative.  Negative for dizziness, focal weakness, weakness and headaches.  Psychiatric/Behavioral: Negative.  The patient is not nervous/anxious.     As per HPI. Otherwise, a complete review of systems is negative.  PAST MEDICAL HISTORY: Past Medical History:  Diagnosis Date   Ascending aortic aneurysm (HCC)    a.  12/2020 Echo: Asc Ao 48mm, Ao root 40mm. b. 03/2022 Asc Ao 4.6 cm (4.5 cm in 2019)   CKD (chronic kidney disease), stage III (HCC)    Compression fracture of body of thoracic vertebra (HCC)    Diastolic dysfunction    a. 12/2020 Echo: EF 50-55%, no rwma, mild LVH, GrI DD, nl RV fxn, mild AI. Asc Ao 48mm, Ao root 40mm.   Elevated prostate specific antigen (PSA)    has been 7 for a year    GERD (gastroesophageal reflux disease)    History of colon polyps 2008   [facility],    History of kidney stones    Hyperlipidemia    Hypertension    Myeloma (HCC)    PAF (paroxysmal atrial fibrillation) (HCC)    a. 01/2021-->Eliquis (CHA2DS2VASc = 3-4).   Pain    Prostate hypertrophy    diagnosed at age 24 due to hematospermia    PAST SURGICAL HISTORY: Past Surgical History:  Procedure Laterality Date   CATARACT EXTRACTION W/PHACO Left 01/10/2018   Procedure: CATARACT EXTRACTION PHACO AND INTRAOCULAR LENS PLACEMENT (IOC);  Surgeon: Galen Manila, MD;  Location: [facility] ORS;  Service: Ophthalmology;  Laterality: Left;  [name] 00:24.8 AP% 14.9 CDE 3.68 Fluid pack lot # [id number]   CATARACT EXTRACTION W/PHACO Right 01/25/2018   Procedure: CATARACT EXTRACTION PHACO AND INTRAOCULAR LENS PLACEMENT (IOC);  Surgeon: Galen Manila, MD;  Location: [facility] ORS;  Service: Ophthalmology;  Laterality: Right;  [name] 00:42 AP% 10.8 CDE 4.59 Fluid pack lot # [id number]   COLON SURGERY     CYSTOSCOPY W/ URETERAL STENT PLACEMENT Right 10/16/2017   Procedure: right  URETERAL STENT PLACEMENT,cystoscopy bilateral stent removal,rretrograde;  Surgeon: Riki Altes,  MD;  Location: [facility] ORS;  Service: Urology;  Laterality: Right;   CYSTOSCOPY/URETEROSCOPY/HOLMIUM LASER/STENT PLACEMENT Right 12/16/2020   Procedure: CYSTOSCOPY/URETEROSCOPY/HOLMIUM LASER/STENT PLACEMENT;  Surgeon: Riki Altes, MD;  Location: [facility] ORS;  Service: Urology;  Laterality: Right;   EXTRACORPOREAL SHOCK WAVE LITHOTRIPSY Right 12/11/2020   Procedure:  EXTRACORPOREAL SHOCK WAVE LITHOTRIPSY (ESWL);  Surgeon: Riki Altes, MD;  Location: [facility] ORS;  Service: Urology;  Laterality: Right;   IR BONE TUMOR(S)RF ABLATION  04/16/2022   IR KYPHO EA ADDL LEVEL THORACIC OR LUMBAR  02/02/2021   IR KYPHO LUMBAR INC FX REDUCE BONE BX UNI/BIL CANNULATION INC/IMAGING  02/02/2021   IR KYPHO THORACIC WITH BONE BIOPSY  04/15/2022   KIDNEY STONE SURGERY     KYPHOPLASTY N/A 03/12/2021   Procedure: Jack [name], L3;  Surgeon: Kennedy Bucker, MD;  Location: [facility] ORS;  Service: Orthopedics;  Laterality: N/A;   RESECTION SOFT TISSUE TUMOR LEG / ANKLE RADICAL  jan 2009   Jack [name],  right thigh/knee , nonmalignant   SMALL INTESTINE SURGERY  1946   implaed on picket fence, punctured stomach   TONSILLECTOMY      FAMILY HISTORY: Family History  Problem Relation Age of Onset   Hypertension Father    Hyperlipidemia Father    Cancer Sister        thyroid - dx in late 20's    ADVANCED DIRECTIVES (Y/N):  N  HEALTH MAINTENANCE: Social History   Tobacco Use   Smoking status: Former    Current packs/day: 0.00    Types: Cigarettes    Quit date: 07/05/1965    Years since quitting: 58.3   Smokeless tobacco: Current   Tobacco comments:    Occasional cigar  Vaping Use   Vaping status: Never Used  Substance Use Topics   Alcohol use: Not Currently    Alcohol/week: 1.0 standard drink of alcohol    Types: 1 Standard drinks or equivalent per week    Comment: occassionaly   Drug use: No     Colonoscopy:  PAP:  Bone density:  Lipid panel:  Allergies  Allergen Reactions   Quinolones     Aortic dilation contraindicates FQ   Amlodipine Swelling and Other (See Comments)    LE edema   Azithromycin Nausea And Vomiting   Lipitor [Atorvastatin]     Muscle pain in RT Leg    Lisinopril Cough   Zetia [Ezetimibe]     Pain in legs    Hydromorphone Hcl     Wife states "Medication makes extremely sedated and lethargic"    Current Outpatient Medications  Medication  Sig Dispense Refill   Ascorbic Acid (VITAMIN C) 1000 MG tablet Take 1,000 mg by mouth in the morning and at bedtime.     Calcium Carb-Cholecalciferol (OYSTER SHELL CALCIUM W/D) 500-5 MG-MCG TABS Take 1 tablet by mouth in the morning and at bedtime.     carvedilol (COREG) 12.5 MG tablet Take 0.5 tablets (6.25 mg total) by mouth 2 (two) times daily. 180 tablet 3   dexamethasone (DECADRON) 4 MG tablet Take 5 tablets (20 mg total) by mouth once a week. 60 tablet 2   ELIQUIS 5 MG TABS tablet TAKE 1 TABLET BY MOUTH TWICE A DAY 180 tablet 0   feeding supplement (ENSURE ENLIVE / ENSURE PLUS) LIQD Take 237 mLs by mouth 3 (three) times daily between meals. (Patient taking differently: Take 237 mLs by mouth daily with lunch.) 237 mL 12   gabapentin (NEURONTIN) 300 MG capsule TAKE 2 CAPSULES BY  MOUTH 3 TIMES A DAY 180 capsule 2   ibuprofen (ADVIL) 200 MG tablet Take 200 mg by mouth 2 (two) times daily as needed.     lenalidomide (REVLIMID) 20 MG capsule Take 1 capsule (20 mg total) by mouth daily. Take for 21 days, then hold for 7 days. Repeat every 28 days. 21 capsule 0   lidocaine (XYLOCAINE) 5 % ointment APPLY TOPICALLY 3 (THREE) TIMES DAILY AS NEEDED FOR MILD PAIN OR MODERATE PAIN. 50 g 2   morphine (MS CONTIN) 30 MG 12 hr tablet Take 1 tablet (30 mg total) by mouth every 8 (eight) hours. 90 tablet 0   Multiple Vitamin (MULTIVITAMIN WITH MINERALS) TABS tablet Take 1 tablet by mouth daily.     Multiple Vitamins-Minerals (PRESERVISION AREDS PO) Take 1 capsule by mouth in the morning and at bedtime.     omeprazole (PRILOSEC) 20 MG capsule TAKE 1 CAPSULE BY MOUTH EVERY DAY 90 capsule 2   Oxycodone HCl 10 MG TABS Take 1 tablet (10 mg total) by mouth every 4 (four) hours as needed (pain). 90 tablet 0   polyethylene glycol (MIRALAX / GLYCOLAX) 17 g packet Take 17 g by mouth 2 (two) times daily.  0   potassium citrate (UROCIT-K) 10 MEQ (1080 MG) SR tablet TAKE 1 TABLET (10 MEQ TOTAL) BY MOUTH 3 (THREE) TIMES DAILY  WITH MEALS. 270 tablet 0   senna-docusate (SENOKOT-S) 8.6-50 MG tablet Take 1 tablet by mouth 2 (two) times daily. 30 tablet 0   naloxone (NARCAN) nasal spray 4 mg/0.1 mL SPRAY 1 SPRAY INTO ONE NOSTRIL AS DIRECTED FOR OPIOID OVERDOSE (TURN PERSON ON SIDE AFTER DOSE. IF NO RESPONSE IN 2-3 MINUTES OR PERSON RESPONDS BUT RELAPSES, REPEAT USING A NEW SPRAY DEVICE AND SPRAY INTO THE OTHER NOSTRIL. CALL 911 AFTER USE.) * EMERGENCY USE ONLY * (Patient not taking: Reported on 09/12/2023) 1 each 0   No current facility-administered medications for this visit.    OBJECTIVE: Vitals:   10/10/23 1032  BP: 115/67  Pulse: (!) 53  Resp: 16  Temp: 99.1 F (37.3 C)  SpO2: 97%        Body mass index is 27.64 kg/m.    ECOG FS:1 - Symptomatic but completely ambulatory  General: Well-developed, well-nourished, no acute distress. Eyes: Pink conjunctiva, anicteric sclera. HEENT: Normocephalic, moist mucous membranes. Lungs: No audible wheezing or coughing. Heart: Regular rate and rhythm. Abdomen: Soft, nontender, no obvious distention. Musculoskeletal: No edema, cyanosis, or clubbing. Neuro: Alert, answering all questions appropriately. Cranial nerves grossly intact. Skin: No rashes or petechiae noted. Psych: Normal affect.  LAB RESULTS:  Lab Results  Component Value Date   NA 136 10/10/2023   K 4.0 10/10/2023   CL 99 10/10/2023   CO2 28 10/10/2023   GLUCOSE 130 (H) 10/10/2023   BUN 24 (H) 10/10/2023   CREATININE 1.18 10/10/2023   CALCIUM 8.8 (L) 10/10/2023   PROT 5.8 (L) 10/10/2023   ALBUMIN 3.7 10/10/2023   AST 20 10/10/2023   ALT 10 10/10/2023   ALKPHOS 84 10/10/2023   BILITOT 0.9 10/10/2023   GFRNONAA 59 (L) 10/10/2023   GFRAA 29 (L) 10/16/2017    Lab Results  Component Value Date   WBC 4.8 10/10/2023   NEUTROABS 3.5 10/10/2023   HGB 12.3 (L) 10/10/2023   HCT 38.5 (L) 10/10/2023   MCV 101.3 (H) 10/10/2023   PLT 63 (L) 10/10/2023     STUDIES: No results  found.  ASSESSMENT: Stage II Kappa chain myeloma.  PLAN:  Stage II kappa chain myeloma: (11:14 translocation, high risk) SPEP essentially negative and immunoglobulins are within normal limits.  Patient's kappa light chains initially improved from greater than 5400 down to 39.1.  PET scan results from December 08, 2021 reviewed independently with no obvious evidence of progressive disease.  Nuclear med bone scan results from December 28, 2022 did not reveal any concerning pathology.  Patient was off Revlimid for approximately 4 months given admission for sepsis and abscess and extended antibiotic treatment.  His kappa chains increased, but are now trending back down his most recent result was 203.8.  Rivka Barbara has been discontinued permanently secondary to dental issues.  No further intervention is needed.  Return to clinic in 4 weeks for laboratory work and evaluation by clinical pharmacy.  Patient will then return to clinic in 8 weeks for further evaluation and continuation of treatment.   Anemia: Hemoglobin only mildly decreased at 12.3. Leukopenia: Resolved. Thrombocytopenia: Chronic and unchanged.  Patient's platelet count is 63 which is approximately his baseline. Bone lesions: Nuclear med bone scan as above with no obvious evidence of progressive disease.  Rivka Barbara has been discontinued. Pain: Improved.  Continue follow-up with pain clinic as indicated.  Nuclear medicine bone scan completed on December 28, 2022 did not report any active areas of myeloma.  Patient only takes long-acting morphine 30 mg every 8 hours.  He also takes Tylenol and ibuprofen sparingly.  Previously he was instructed that if his platelets fall below 50 this would need to be discontinued.  Radiation oncology has determined no further treatments are needed at this time.   Renal insufficiency: Resolved. Tooth abscess/sepsis: Resolved.  Patient is now off antibiotics.  I spent a total of 30 minutes reviewing chart data, face-to-face  evaluation with the patient, counseling and coordination of care as detailed above.   Patient expressed understanding and was in agreement with this plan. He also understands that He can call clinic at any time with any questions, concerns, or complaints.     Cancer Staging  Kappa light chain myeloma (HCC) Staging form: Plasma Cell Myeloma and Plasma Cell Disorders, AJCC 8th Edition - Clinical stage from 04/16/2021: Albumin (g/dL): 3.8, ISS: Stage II, High-risk cytogenetics: Absent, LDH: Unknown - Signed by Jeralyn Ruths, MD on 04/16/2021 Albumin range (g/dL): Greater than or equal to 3.5 Cytogenetics: t(11;14) translocation Serum calcium level: Normal Serum creatinine level: Normal Bone disease on imaging: Present  Jeralyn Ruths, MD   10/10/2023 12:18 PM

## 2023-10-11 ENCOUNTER — Encounter: Payer: Self-pay | Admitting: Oncology

## 2023-10-11 LAB — KAPPA/LAMBDA LIGHT CHAINS
Kappa free light chain: 156.5 mg/L — ABNORMAL HIGH (ref 3.3–19.4)
Kappa, lambda light chain ratio: 7.6 — ABNORMAL HIGH (ref 0.26–1.65)
Lambda free light chains: 20.6 mg/L (ref 5.7–26.3)

## 2023-10-17 ENCOUNTER — Other Ambulatory Visit: Payer: Self-pay | Admitting: *Deleted

## 2023-10-17 ENCOUNTER — Telehealth: Payer: Self-pay | Admitting: *Deleted

## 2023-10-17 DIAGNOSIS — C9000 Multiple myeloma not having achieved remission: Secondary | ICD-10-CM

## 2023-10-17 MED ORDER — LENALIDOMIDE 20 MG PO CAPS: 20.0000 mg | ORAL_CAPSULE | Freq: Every day | ORAL | 0 refills | Status: DC

## 2023-10-21 ENCOUNTER — Other Ambulatory Visit: Payer: Self-pay | Admitting: Nurse Practitioner

## 2023-10-21 DIAGNOSIS — L570 Actinic keratosis: Secondary | ICD-10-CM | POA: Diagnosis not present

## 2023-10-21 DIAGNOSIS — C9000 Multiple myeloma not having achieved remission: Secondary | ICD-10-CM

## 2023-10-21 DIAGNOSIS — L905 Scar conditions and fibrosis of skin: Secondary | ICD-10-CM | POA: Diagnosis not present

## 2023-10-22 ENCOUNTER — Encounter: Payer: Self-pay | Admitting: Hospice and Palliative Medicine

## 2023-10-24 ENCOUNTER — Encounter: Payer: Self-pay | Admitting: Oncology

## 2023-10-24 DIAGNOSIS — H353231 Exudative age-related macular degeneration, bilateral, with active choroidal neovascularization: Secondary | ICD-10-CM | POA: Diagnosis not present

## 2023-10-24 DIAGNOSIS — H35033 Hypertensive retinopathy, bilateral: Secondary | ICD-10-CM | POA: Diagnosis not present

## 2023-10-24 DIAGNOSIS — H43812 Vitreous degeneration, left eye: Secondary | ICD-10-CM | POA: Diagnosis not present

## 2023-10-24 DIAGNOSIS — Z961 Presence of intraocular lens: Secondary | ICD-10-CM | POA: Diagnosis not present

## 2023-10-25 ENCOUNTER — Inpatient Hospital Stay: Payer: PPO | Attending: Oncology | Admitting: Hospice and Palliative Medicine

## 2023-10-25 DIAGNOSIS — C9000 Multiple myeloma not having achieved remission: Secondary | ICD-10-CM | POA: Insufficient documentation

## 2023-10-25 DIAGNOSIS — G893 Neoplasm related pain (acute) (chronic): Secondary | ICD-10-CM | POA: Diagnosis not present

## 2023-10-25 DIAGNOSIS — G629 Polyneuropathy, unspecified: Secondary | ICD-10-CM | POA: Insufficient documentation

## 2023-10-25 DIAGNOSIS — D649 Anemia, unspecified: Secondary | ICD-10-CM | POA: Insufficient documentation

## 2023-10-25 DIAGNOSIS — I129 Hypertensive chronic kidney disease with stage 1 through stage 4 chronic kidney disease, or unspecified chronic kidney disease: Secondary | ICD-10-CM | POA: Insufficient documentation

## 2023-10-25 DIAGNOSIS — Z87891 Personal history of nicotine dependence: Secondary | ICD-10-CM | POA: Insufficient documentation

## 2023-10-25 DIAGNOSIS — N1832 Chronic kidney disease, stage 3b: Secondary | ICD-10-CM | POA: Insufficient documentation

## 2023-10-25 DIAGNOSIS — I4891 Unspecified atrial fibrillation: Secondary | ICD-10-CM | POA: Insufficient documentation

## 2023-10-25 DIAGNOSIS — Z7901 Long term (current) use of anticoagulants: Secondary | ICD-10-CM | POA: Insufficient documentation

## 2023-10-25 DIAGNOSIS — Z923 Personal history of irradiation: Secondary | ICD-10-CM | POA: Insufficient documentation

## 2023-10-25 NOTE — Progress Notes (Signed)
Virtual Visit via Telephone Note  I connected with Jack [name] on 10/25/23 at 11:30 AM EST by telephone and verified that I am speaking with the correct person using two identifiers.  Location: Patient: Home Provider: Clinic   I discussed the limitations, risks, security and privacy concerns of performing an evaluation and management service by telephone and the availability of in person appointments. I also discussed with the patient that there may be a patient responsible charge related to this service. The patient expressed understanding and agreed to proceed.   History of Present Illness: Jack [name] is a 89 y.o. male with multiple medical problems including hypertension, CKD stage IIIb, A. fib on Eliquis, anemia, and refractory multiple myeloma.  Patient has history of pathologic compression fractures with T11 kyphoplasty and ablation.  He has had chronic back pain.  Observations/Objective: Spoke with patient and wife by phone.    Patient says that his pain was stable until he recently restarted Revlimid.  Since then, he has been having worse low and mid back pain.  He is taking his MS Contin and oxycodone as directed, which has helped.  However, patient wanted to discuss possible option of taking a cancer treatment holiday.  Patient denies other symptomatic complaints or concerns.  Denies lower extremity weakness.  Otherwise, he says he is doing about the same.  Assessment and Plan: Chronic back pain secondary to multiple myeloma and history of recurrent/chronic compression fractures.  S/p XRT.  Continue MS Contin/oxycodone.  Patient previously evaluated in pain clinic for possible injections but treatment delayed due to hospitalization.  Will obtain repeat MRI thoracic and lumbar spine and refer back to pain clinic for possible interventional procedures.  Neuropathy -Continue gabapentin.   Case and plan discussed with Dr. Orlie Dakin  Follow Up Instructions: Follow-up  telephone visit 1 to 2 months   I discussed the assessment and treatment plan with the patient. The patient was provided an opportunity to ask questions and all were answered. The patient agreed with the plan and demonstrated an understanding of the instructions.   The patient was advised to call back or seek an in-person evaluation if the symptoms worsen or if the condition fails to improve as anticipated.  I provided 15 minutes of non-face-to-face time during this encounter.   Malachy Moan, NP

## 2023-10-26 ENCOUNTER — Other Ambulatory Visit: Payer: PPO

## 2023-10-27 ENCOUNTER — Ambulatory Visit: Payer: PPO

## 2023-10-31 ENCOUNTER — Ambulatory Visit
Admission: RE | Admit: 2023-10-31 | Discharge: 2023-10-31 | Disposition: A | Discharge status: Home / Self Care | Payer: PPO | Source: Ambulatory Visit | Attending: Hospice and Palliative Medicine | Admitting: Hospice and Palliative Medicine

## 2023-10-31 DIAGNOSIS — M549 Dorsalgia, unspecified: Secondary | ICD-10-CM | POA: Diagnosis not present

## 2023-10-31 DIAGNOSIS — C9000 Multiple myeloma not having achieved remission: Secondary | ICD-10-CM | POA: Diagnosis not present

## 2023-10-31 DIAGNOSIS — M47816 Spondylosis without myelopathy or radiculopathy, lumbar region: Secondary | ICD-10-CM | POA: Diagnosis not present

## 2023-10-31 DIAGNOSIS — G893 Neoplasm related pain (acute) (chronic): Secondary | ICD-10-CM | POA: Insufficient documentation

## 2023-10-31 DIAGNOSIS — M4316 Spondylolisthesis, lumbar region: Secondary | ICD-10-CM | POA: Diagnosis not present

## 2023-10-31 DIAGNOSIS — M4856XA Collapsed vertebra, not elsewhere classified, lumbar region, initial encounter for fracture: Secondary | ICD-10-CM | POA: Diagnosis not present

## 2023-10-31 DIAGNOSIS — M47814 Spondylosis without myelopathy or radiculopathy, thoracic region: Secondary | ICD-10-CM | POA: Diagnosis not present

## 2023-10-31 DIAGNOSIS — M48061 Spinal stenosis, lumbar region without neurogenic claudication: Secondary | ICD-10-CM | POA: Diagnosis not present

## 2023-10-31 DIAGNOSIS — G8929 Other chronic pain: Secondary | ICD-10-CM | POA: Diagnosis not present

## 2023-10-31 MED ORDER — GADOBUTROL 1 MMOL/ML IV SOLN
8.0000 mL | Freq: Once | INTRAVENOUS | Status: AC | PRN
  Administered 2023-10-31: 8 mL via INTRAVENOUS

## 2023-11-01 ENCOUNTER — Encounter: Payer: Self-pay | Admitting: Oncology

## 2023-11-01 ENCOUNTER — Other Ambulatory Visit: Payer: Self-pay

## 2023-11-01 MED ORDER — MORPHINE SULFATE ER 30 MG PO TBCR: 30.0000 mg | EXTENDED_RELEASE_TABLET | Freq: Three times a day (TID) | ORAL | 0 refills | Status: DC

## 2023-11-01 MED ORDER — OXYCODONE HCL 10 MG PO TABS: 10.0000 mg | ORAL_TABLET | ORAL | 0 refills | Status: DC | PRN

## 2023-11-02 DIAGNOSIS — M545 Low back pain, unspecified: Secondary | ICD-10-CM | POA: Diagnosis not present

## 2023-11-02 DIAGNOSIS — G8929 Other chronic pain: Secondary | ICD-10-CM | POA: Diagnosis not present

## 2023-11-02 DIAGNOSIS — G608 Other hereditary and idiopathic neuropathies: Secondary | ICD-10-CM | POA: Diagnosis not present

## 2023-11-02 DIAGNOSIS — C9000 Multiple myeloma not having achieved remission: Secondary | ICD-10-CM | POA: Diagnosis not present

## 2023-11-07 ENCOUNTER — Inpatient Hospital Stay: Payer: PPO | Admitting: Pharmacist

## 2023-11-07 ENCOUNTER — Inpatient Hospital Stay: Payer: PPO

## 2023-11-07 ENCOUNTER — Encounter: Payer: Self-pay | Admitting: Pharmacist

## 2023-11-07 VITALS — BP 135/63 | HR 43 | Temp 97.6°F | Wt 171.5 lb

## 2023-11-07 DIAGNOSIS — I4891 Unspecified atrial fibrillation: Secondary | ICD-10-CM | POA: Diagnosis not present

## 2023-11-07 DIAGNOSIS — C9000 Multiple myeloma not having achieved remission: Secondary | ICD-10-CM

## 2023-11-07 DIAGNOSIS — I129 Hypertensive chronic kidney disease with stage 1 through stage 4 chronic kidney disease, or unspecified chronic kidney disease: Secondary | ICD-10-CM | POA: Diagnosis not present

## 2023-11-07 DIAGNOSIS — G629 Polyneuropathy, unspecified: Secondary | ICD-10-CM | POA: Diagnosis not present

## 2023-11-07 DIAGNOSIS — Z7901 Long term (current) use of anticoagulants: Secondary | ICD-10-CM | POA: Diagnosis not present

## 2023-11-07 DIAGNOSIS — D649 Anemia, unspecified: Secondary | ICD-10-CM | POA: Diagnosis not present

## 2023-11-07 DIAGNOSIS — G893 Neoplasm related pain (acute) (chronic): Secondary | ICD-10-CM | POA: Diagnosis not present

## 2023-11-07 DIAGNOSIS — G608 Other hereditary and idiopathic neuropathies: Secondary | ICD-10-CM

## 2023-11-07 DIAGNOSIS — N1832 Chronic kidney disease, stage 3b: Secondary | ICD-10-CM | POA: Diagnosis not present

## 2023-11-07 DIAGNOSIS — Z923 Personal history of irradiation: Secondary | ICD-10-CM | POA: Diagnosis not present

## 2023-11-07 DIAGNOSIS — Z87891 Personal history of nicotine dependence: Secondary | ICD-10-CM | POA: Diagnosis not present

## 2023-11-07 LAB — CBC WITH DIFFERENTIAL (CANCER CENTER ONLY)
Abs Immature Granulocytes: 0.01 10*3/uL (ref 0.00–0.07)
Basophils Absolute: 0 10*3/uL (ref 0.0–0.1)
Basophils Relative: 1 %
Eosinophils Absolute: 0.2 10*3/uL (ref 0.0–0.5)
Eosinophils Relative: 6 %
HCT: 37.4 % — ABNORMAL LOW (ref 39.0–52.0)
Hemoglobin: 12.1 g/dL — ABNORMAL LOW (ref 13.0–17.0)
Immature Granulocytes: 0 %
Lymphocytes Relative: 26 %
Lymphs Abs: 0.8 10*3/uL (ref 0.7–4.0)
MCH: 33.6 pg (ref 26.0–34.0)
MCHC: 32.4 g/dL (ref 30.0–36.0)
MCV: 103.9 fL — ABNORMAL HIGH (ref 80.0–100.0)
Monocytes Absolute: 0.4 10*3/uL (ref 0.1–1.0)
Monocytes Relative: 14 %
Neutro Abs: 1.6 10*3/uL — ABNORMAL LOW (ref 1.7–7.7)
Neutrophils Relative %: 53 %
Platelet Count: 59 10*3/uL — ABNORMAL LOW (ref 150–400)
RBC: 3.6 MIL/uL — ABNORMAL LOW (ref 4.22–5.81)
RDW: 15.9 % — ABNORMAL HIGH (ref 11.5–15.5)
WBC Count: 3 10*3/uL — ABNORMAL LOW (ref 4.0–10.5)
nRBC: 0 % (ref 0.0–0.2)

## 2023-11-07 LAB — CMP (CANCER CENTER ONLY)
ALT: 10 U/L (ref 0–44)
AST: 15 U/L (ref 15–41)
Albumin: 3.5 g/dL (ref 3.5–5.0)
Alkaline Phosphatase: 80 U/L (ref 38–126)
Anion gap: 9 (ref 5–15)
BUN: 27 mg/dL — ABNORMAL HIGH (ref 8–23)
CO2: 27 mmol/L (ref 22–32)
Calcium: 8.8 mg/dL — ABNORMAL LOW (ref 8.9–10.3)
Chloride: 102 mmol/L (ref 98–111)
Creatinine: 1.37 mg/dL — ABNORMAL HIGH (ref 0.61–1.24)
GFR, Estimated: 50 mL/min — ABNORMAL LOW (ref >60)
Glucose, Bld: 125 mg/dL — ABNORMAL HIGH (ref 70–99)
Potassium: 3.9 mmol/L (ref 3.5–5.1)
Sodium: 138 mmol/L (ref 135–145)
Total Bilirubin: 0.5 mg/dL (ref 0.0–1.2)
Total Protein: 5.5 g/dL — ABNORMAL LOW (ref 6.5–8.1)

## 2023-11-07 LAB — SAMPLE TO BLOOD BANK

## 2023-11-07 MED ORDER — LENALIDOMIDE 15 MG PO CAPS: 15.0000 mg | ORAL_CAPSULE | Freq: Every day | ORAL | 0 refills | Status: DC

## 2023-11-07 NOTE — Progress Notes (Signed)
 Oral Chemotherapy Clinic [facility]  [phone] [phone]  Patient Care Team: Dorothey Baseman, MD as PCP - General (Family Medicine) End, Cristal Deer, MD as PCP - Cardiology (Cardiology) Quentin Cornwall, MD (Endocrinology) Jeralyn Ruths, MD as Consulting Physician (Hematology and Oncology) Rodney Langton, RN as [facility] Care Management   Name of the patient: Jack [name]  [id number]  1933-03-25   Date of visit: 11/07/23  HPI: Patient is a 89 y.o. male with newly diagnosed multiple myeloma. Currently treated with Revlimid (lenalidomide) and dexamethasone. Patient was initiated on an all oral regimen because he was unable to physically make it into clinic at the time treatment due to his disease/performance status. His status improved he was able to come back to inperson appts on 07/14/21. Lenalidomide was dose reduced to 15 mg daily 21on/7off on 11/07/23 due to renal function.   Reason for Consult: Oral chemotherapy follow-up for lenalidomide therapy.   PAST MEDICAL HISTORY: Past Medical History:  Diagnosis Date   Ascending aortic aneurysm (HCC)    a. 12/2020 Echo: Asc Ao 48mm, Ao root 40mm. b. 03/2022 Asc Ao 4.6 cm (4.5 cm in 2019)   CKD (chronic kidney disease), stage III (HCC)    Compression fracture of body of thoracic vertebra (HCC)    Diastolic dysfunction    a. 12/2020 Echo: EF 50-55%, no rwma, mild LVH, GrI DD, nl RV fxn, mild AI. Asc Ao 48mm, Ao root 40mm.   Elevated prostate specific antigen (PSA)    has been 7 for a year    GERD (gastroesophageal reflux disease)    History of colon polyps 2008   [facility],    History of kidney stones    Hyperlipidemia    Hypertension    Myeloma (HCC)    PAF (paroxysmal atrial fibrillation) (HCC)    a. 01/2021-->Eliquis (CHA2DS2VASc = 3-4).   Pain    Prostate hypertrophy    diagnosed at age 39 due to hematospermia    HEMATOLOGY/ONCOLOGY HISTORY:  Oncology  History  Kappa light chain myeloma (HCC)  03/12/2021 Initial Diagnosis   Kappa light chain myeloma (HCC)   03/27/2021 - 04/06/2021 Chemotherapy   Patient is on Treatment Plan : MYELOMA NON-TRANSPLANT CANDIDATES VRd SQ q21d      04/16/2021 Cancer Staging   Staging form: Plasma Cell Myeloma and Plasma Cell Disorders, AJCC 8th Edition - Clinical stage from 04/16/2021: Albumin (g/dL): 3.8, ISS: Stage II, High-risk cytogenetics: Absent, LDH: Unknown - Signed by Jeralyn Ruths, MD on 04/16/2021 Albumin range (g/dL): Greater than or equal to 3.5 Cytogenetics: t(11;14) translocation Serum calcium level: Normal Serum creatinine level: Normal Bone disease on imaging: Present     ALLERGIES:  is allergic to quinolones, amlodipine, azithromycin, lipitor [atorvastatin], lisinopril, zetia [ezetimibe], and hydromorphone hcl.  MEDICATIONS:  Current Outpatient Medications  Medication Sig Dispense Refill   Alpha-Lipoic Acid 600 MG CAPS Take by mouth.     Ascorbic Acid (VITAMIN C) 1000 MG tablet Take 1,000 mg by mouth in the morning and at bedtime.     Calcium Carb-Cholecalciferol (OYSTER SHELL CALCIUM W/D) 500-5 MG-MCG TABS Take 1 tablet by mouth in the morning and at bedtime.     carvedilol (COREG) 12.5 MG tablet Take 0.5 tablets (6.25 mg total) by mouth 2 (two) times daily. 180 tablet 3   dexamethasone (DECADRON) 4 MG tablet Take 5 tablets (20 mg total) by mouth once a week. 60 tablet 2   ELIQUIS 5 MG TABS tablet TAKE 1 TABLET  BY MOUTH TWICE A DAY 180 tablet 0   gabapentin (NEURONTIN) 300 MG capsule TAKE 2 CAPSULES BY MOUTH 3 TIMES A DAY 180 capsule 2   ibuprofen (ADVIL) 200 MG tablet Take 200 mg by mouth 2 (two) times daily as needed.     lenalidomide (REVLIMID) 20 MG capsule Take 1 capsule (20 mg total) by mouth daily. Take for 21 days, then hold for 7 days. Repeat every 28 days. 21 capsule 0   lidocaine (XYLOCAINE) 5 % ointment APPLY TOPICALLY 3 (THREE) TIMES DAILY AS NEEDED FOR MILD PAIN OR MODERATE  PAIN. 50 g 2   morphine (MS CONTIN) 30 MG 12 hr tablet Take 1 tablet (30 mg total) by mouth every 8 (eight) hours. 90 tablet 0   Multiple Vitamin (MULTIVITAMIN WITH MINERALS) TABS tablet Take 1 tablet by mouth daily.     Multiple Vitamins-Minerals (PRESERVISION AREDS PO) Take 1 capsule by mouth in the morning and at bedtime.     omeprazole (PRILOSEC) 20 MG capsule TAKE 1 CAPSULE BY MOUTH EVERY DAY 90 capsule 2   Oxycodone HCl 10 MG TABS Take 1 tablet (10 mg total) by mouth every 4 (four) hours as needed (pain). 90 tablet 0   polyethylene glycol (MIRALAX / GLYCOLAX) 17 g packet Take 17 g by mouth 2 (two) times daily.  0   potassium citrate (UROCIT-K) 10 MEQ (1080 MG) SR tablet TAKE 1 TABLET (10 MEQ TOTAL) BY MOUTH 3 (THREE) TIMES DAILY WITH MEALS. 270 tablet 0   senna-docusate (SENOKOT-S) 8.6-50 MG tablet Take 1 tablet by mouth 2 (two) times daily. 30 tablet 0   feeding supplement (ENSURE ENLIVE / ENSURE PLUS) LIQD Take 237 mLs by mouth 3 (three) times daily between meals. (Patient not taking: Reported on 11/07/2023) 237 mL 12   naloxone (NARCAN) nasal spray 4 mg/0.1 mL SPRAY 1 SPRAY INTO ONE NOSTRIL AS DIRECTED FOR OPIOID OVERDOSE (TURN PERSON ON SIDE AFTER DOSE. IF NO RESPONSE IN 2-3 MINUTES OR PERSON RESPONDS BUT RELAPSES, REPEAT USING A NEW SPRAY DEVICE AND SPRAY INTO THE OTHER NOSTRIL. CALL 911 AFTER USE.) * EMERGENCY USE ONLY * (Patient not taking: Reported on 09/12/2023) 1 each 0   No current facility-administered medications for this visit.    VITAL SIGNS: BP 135/63 (BP Location: Left Arm, Patient Position: Sitting)   Pulse (!) 43   Temp 97.6 F (36.4 C) (Tympanic)   Wt 77.8 kg (171 lb 8 oz)   SpO2 98%   BMI 26.86 kg/m  Filed Weights   11/07/23 1349  Weight: 77.8 kg (171 lb 8 oz)    Estimated body mass index is 26.86 kg/m as calculated from the following:   Height as of 10/10/23: 5\' 7"  (1.702 m).   Weight as of this encounter: 77.8 kg (171 lb 8 oz).  LABS: CBC:    Component  Value Date/Time   WBC 3.0 (L) 11/07/2023 1326   WBC 4.4 09/12/2023 0934   HGB 12.1 (L) 11/07/2023 1326   HGB 11.0 (L) 02/24/2021 1014   HCT 37.4 (L) 11/07/2023 1326   HCT 35.3 (L) 02/24/2021 1014   PLT 59 (L) 11/07/2023 1326   PLT 249 02/24/2021 1014   MCV 103.9 (H) 11/07/2023 1326   MCV 96 02/24/2021 1014   MCV 90 01/24/2013 0819   NEUTROABS 1.6 (L) 11/07/2023 1326   NEUTROABS 7.4 (H) 01/24/2013 0819   LYMPHSABS 0.8 11/07/2023 1326   LYMPHSABS 1.2 01/24/2013 0819   MONOABS 0.4 11/07/2023 1326   MONOABS 0.3 01/24/2013  0819   EOSABS 0.2 11/07/2023 1326   EOSABS 0.0 01/24/2013 0819   BASOSABS 0.0 11/07/2023 1326   BASOSABS 0.0 01/24/2013 0819   Comprehensive Metabolic Panel:    Component Value Date/Time   NA 138 11/07/2023 1326   NA 136 02/24/2021 1014   NA 138 01/24/2013 0819   K 3.9 11/07/2023 1326   K 3.8 01/24/2013 0819   CL 102 11/07/2023 1326   CL 105 01/24/2013 0819   CO2 27 11/07/2023 1326   CO2 25 01/24/2013 0819   BUN 27 (H) 11/07/2023 1326   BUN 23 02/24/2021 1014   BUN 20 (H) 01/24/2013 0819   CREATININE 1.37 (H) 11/07/2023 1326   CREATININE 1.52 (H) 01/24/2013 0819   GLUCOSE 125 (H) 11/07/2023 1326   GLUCOSE 171 (H) 01/24/2013 0819   CALCIUM 8.8 (L) 11/07/2023 1326   CALCIUM 10.4 (H) 02/19/2021 1229   AST 15 11/07/2023 1326   ALT 10 11/07/2023 1326   ALT 15 01/24/2013 0819   ALKPHOS 80 11/07/2023 1326   ALKPHOS 74 01/24/2013 0819   BILITOT 0.5 11/07/2023 1326   PROT 5.5 (L) 11/07/2023 1326   PROT 6.2 02/24/2021 1014   PROT 6.3 (L) 01/24/2013 0819   ALBUMIN 3.5 11/07/2023 1326   ALBUMIN 4.5 02/24/2021 1014   ALBUMIN 3.6 01/24/2013 0819     Present during today's visit: Patient and his wife Jack [name]  Assessment and Plan: CBC/CMP reviewed with the [facility], SCr increased again to 1.37. CrCl 41 ml/min. Patient reports staying well hydrated since last visit. Will dose reduce lenalidomide today to lenalidomide 15 mg PO daily, 21 days on/7 off. Continue  dexamethasone 20 mg weekly.  Patient will finish remaining supply of lenalidomide 20 mg prior to starting new dose. Prescription of lenalidomide 15 mg will be sent to [facility]  May consider increasing dose back up to 20 mg in the future if improvement in baseline renal function Light chains pending will continue to monitor trending in kappa Pain is reported as intermittent. Seen by palliative care. Patient reports his pain is being managed well with current pain regimen Overall patient is doing well, but still endorses baseline fatigue. Patient inquired about possible lenalidomide holiday. After discussing plans for dose reduction, the Swanks are in agreeance regarding overall plan for lenalidomide therapy.    Oral Chemotherapy Adherence: no reported missed doses.  No patient barriers to medication adherence identified.   New medications: none reported  Medication Access Issues: no issues, fills Revlimid at [facility]  Patient expressed understanding and was in agreement with this plan. He also understands that He can call clinic at any time with any questions, concerns, or complaints.   Follow-up plan: RTC in 4 weeks  Thank you for allowing me to participate in the care of this very pleasant patient.   Time Total: 15 mins  Visit consisted of counseling and education on dealing with issues of symptom management in the setting of serious and potentially life-threatening illness.Greater than 50%  of this time was spent counseling and coordinating care related to the above assessment and plan.  Signed by: Jerry Caras, PharmD PGY2 Oncology Pharmacy Resident   11/07/2023 3:51 PM

## 2023-11-08 DIAGNOSIS — D6869 Other thrombophilia: Secondary | ICD-10-CM | POA: Diagnosis not present

## 2023-11-08 DIAGNOSIS — C9000 Multiple myeloma not having achieved remission: Secondary | ICD-10-CM | POA: Diagnosis not present

## 2023-11-08 DIAGNOSIS — N1832 Chronic kidney disease, stage 3b: Secondary | ICD-10-CM | POA: Diagnosis not present

## 2023-11-08 DIAGNOSIS — C7951 Secondary malignant neoplasm of bone: Secondary | ICD-10-CM | POA: Diagnosis not present

## 2023-11-08 DIAGNOSIS — D61818 Other pancytopenia: Secondary | ICD-10-CM | POA: Diagnosis not present

## 2023-11-08 DIAGNOSIS — I480 Paroxysmal atrial fibrillation: Secondary | ICD-10-CM | POA: Diagnosis not present

## 2023-11-08 DIAGNOSIS — F1120 Opioid dependence, uncomplicated: Secondary | ICD-10-CM | POA: Diagnosis not present

## 2023-11-08 DIAGNOSIS — I7121 Aneurysm of the ascending aorta, without rupture: Secondary | ICD-10-CM | POA: Diagnosis not present

## 2023-11-08 DIAGNOSIS — H353231 Exudative age-related macular degeneration, bilateral, with active choroidal neovascularization: Secondary | ICD-10-CM | POA: Diagnosis not present

## 2023-11-08 DIAGNOSIS — E46 Unspecified protein-calorie malnutrition: Secondary | ICD-10-CM | POA: Diagnosis not present

## 2023-11-08 DIAGNOSIS — D8481 Immunodeficiency due to conditions classified elsewhere: Secondary | ICD-10-CM | POA: Diagnosis not present

## 2023-11-08 DIAGNOSIS — D808 Other immunodeficiencies with predominantly antibody defects: Secondary | ICD-10-CM | POA: Diagnosis not present

## 2023-11-08 LAB — KAPPA/LAMBDA LIGHT CHAINS
Kappa free light chain: 140.8 mg/L — ABNORMAL HIGH (ref 3.3–19.4)
Kappa, lambda light chain ratio: 7.91 — ABNORMAL HIGH (ref 0.26–1.65)
Lambda free light chains: 17.8 mg/L (ref 5.7–26.3)

## 2023-11-09 DIAGNOSIS — G608 Other hereditary and idiopathic neuropathies: Secondary | ICD-10-CM | POA: Diagnosis not present

## 2023-11-11 DIAGNOSIS — G608 Other hereditary and idiopathic neuropathies: Secondary | ICD-10-CM | POA: Diagnosis not present

## 2023-11-15 ENCOUNTER — Encounter: Payer: Self-pay | Admitting: Oncology

## 2023-11-15 NOTE — Progress Notes (Unsigned)
 PROVIDER NOTE: Information contained herein reflects review and annotations entered in association with encounter. Interpretation of such information and data should be left to medically-trained personnel. Information provided to patient can be located elsewhere in the medical record under "Patient Instructions". Document created using STT-dictation technology, any transcriptional errors that may result from process are unintentional.    Patient: Jack [name]  Service Category: E/M  Provider: Oswaldo Done, MD  DOB: 1933-06-10  DOS: 11/16/2023  Referring Provider: Dorothey Baseman, MD  MRN: [id number]  Specialty: Interventional Pain Management  PCP: Dorothey Baseman, MD  Type: Established Patient  Setting: Ambulatory outpatient    Location: Office  Delivery: Face-to-face     HPI  [name], a 89 y.o. year old male, is here today because of his No primary diagnosis found.. Mr. Mcnee primary complain today is No chief complaint on file.  Pertinent problems: Mr. Zuercher has Compression fracture of T12 vertebra, sequela; Compression fracture of L1 lumbar vertebra, sequela; Numbness of foot; Sciatica; Sensory polyneuropathy; Shoulder pain; Multilevel spine pain; Compression fracture of L2 lumbar vertebra, sequela; Kappa light chain myeloma (HCC); Right flank pain; Compression fracture of thoracic vertebra, sequela; Multiple myeloma (HCC); Pelvic fracture (HCC); Collapsed vertebra, not elsewhere classified, thoracic region, subsequent encounter for fracture with routine healing; Muscle weakness (generalized); Statin myopathy; Unsteadiness on feet; Chronic pain syndrome; History of vertebral fracture; Compression fracture of L3 lumbar vertebra, sequela; Compression fracture of T11 vertebra, sequela; Pathologic lumbar vertebral fracture, sequela; Pathologic fracture of thoracic vertebrae, sequela; History of multiple myeloma; Chronic low back pain (1ry area of Pain) (Bilateral) (R>L) w/o sciatica;  History of kyphoplasty (T11, T12, L1, L2, and L3); Grade 1 (3mm) Anterolisthesis of cervical spine (C4/C5, C5/C6, C6/C7); Cancer related pain; Compression fracture of lumbar vertebra, sequela; Multiple pathological fractures, sequela; Thoracic spine pain; and Lumbar spine pain on their pertinent problem list. Pain Assessment: Severity of   is reported as a  /10. Location:    / . Onset:  . Quality:  . Timing:  . Modifying factor(s):  [name] Vitals:  vitals were not taken for this visit.  BMI: Estimated body mass index is 26.86 kg/m as calculated from the following:   Height as of 10/10/23: 5\' 7"  (1.702 m).   Weight as of 11/07/23: 171 lb 8 oz (77.8 kg). Last encounter: 04/13/2023. Last procedure: Visit date not found.  Reason for encounter:  *** . ***  Discussed the use of AI scribe software for clinical note transcription with the patient, who gave verbal consent to proceed.  History of Present Illness           Pharmacotherapy Assessment  Analgesic: oxycodone IR 10 mg tablet, 2 tabs p.o. 3 times daily (# 90) (last filled on 03/11/2023) (90 MME); morphine ER 30 mg tablet, 1 tab p.o. 3 times daily (# 90) (last filled on 03/24/2023) (90 MME) MME/day: 180 mg/day   Monitoring: [place] PMP: PDMP not reviewed this encounter.       Pharmacotherapy: No side-effects or adverse reactions reported. Compliance: No problems identified. Effectiveness: Clinically acceptable.  No notes on file  No results found for: "CBDTHCR" No results found for: "D8THCCBX" No results found for: "D9THCCBX"  UDS:  No results found for: "SUMMARY"    ROS  Constitutional: Denies any fever or chills Gastrointestinal: No reported hemesis, hematochezia, vomiting, or acute GI distress Musculoskeletal: Denies any acute onset joint swelling, redness, loss of ROM, or weakness Neurological: No reported episodes of acute onset apraxia, aphasia, dysarthria, agnosia,  amnesia, paralysis, loss of coordination, or loss of  consciousness  Medication Review  Alpha-Lipoic Acid, Multiple Vitamins-Minerals, Oxycodone HCl, Oyster Shell Calcium w/D, apixaban, carvedilol, dexamethasone, feeding supplement, gabapentin, ibuprofen, lenalidomide, lidocaine, morphine, multivitamin with minerals, naloxone, omeprazole, polyethylene glycol, potassium citrate, senna-docusate, and vitamin C  History Review  Allergy: Jack [name] is allergic to quinolones, amlodipine, azithromycin, lipitor [atorvastatin], lisinopril, zetia [ezetimibe], and hydromorphone hcl. Drug: Jack [name]  reports no history of drug use. Alcohol:  reports that he does not currently use alcohol after a past usage of about 1.0 standard drink of alcohol per week. Tobacco:  reports that he quit smoking about 58 years ago. His smoking use included cigarettes. He uses smokeless tobacco. Social: Jack [name]  reports that he quit smoking about 58 years ago. His smoking use included cigarettes. He uses smokeless tobacco. He reports that he does not currently use alcohol after a past usage of about 1.0 standard drink of alcohol per week. He reports that he does not use drugs. Medical:  has a past medical history of Ascending aortic aneurysm (HCC), CKD (chronic kidney disease), stage III (HCC), Compression fracture of body of thoracic vertebra (HCC), Diastolic dysfunction, Elevated prostate specific antigen (PSA), GERD (gastroesophageal reflux disease), History of colon polyps (2008), History of kidney stones, Hyperlipidemia, Hypertension, Myeloma (HCC), PAF (paroxysmal atrial fibrillation) (HCC), Pain, and Prostate hypertrophy. Surgical: Jack [name]  has a past surgical history that includes Resection soft tissue tumor leg / ankle radical (jan 2009); Small intestine surgery (1946); Cystoscopy w/ ureteral stent placement (Right, 10/16/2017); Kidney stone surgery; Colon surgery; Tonsillectomy; Cataract extraction w/PHACO (Left, 01/10/2018); Cataract extraction w/PHACO (Right, 01/25/2018);  Extracorporeal shock wave lithotripsy (Right, 12/11/2020); Cystoscopy/ureteroscopy/holmium laser/stent placement (Right, 12/16/2020); IR KYPHO LUMBAR INC FX REDUCE BONE BX UNI/BIL CANNULATION INC/IMAGING (02/02/2021); IR KYPHO EA ADDL LEVEL THORACIC OR LUMBAR (02/02/2021); Kyphoplasty (N/A, 03/12/2021); IR KYPHO THORACIC WITH BONE BIOPSY (04/15/2022); and IR Bone Tumor(s)RF Ablation (04/16/2022). Family: family history includes Cancer in his sister; Hyperlipidemia in his father; Hypertension in his father.  Laboratory Chemistry Profile   Renal Lab Results  Component Value Date   BUN 27 (H) 11/07/2023   CREATININE 1.37 (H) 11/07/2023   LABCREA 96.68 02/03/2021   BCR 12 02/24/2021   GFR 59.90 (L) 09/24/2013   GFRAA 29 (L) 10/16/2017   GFRNONAA 50 (L) 11/07/2023    Hepatic Lab Results  Component Value Date   AST 15 11/07/2023   ALT 10 11/07/2023   ALBUMIN 3.5 11/07/2023   ALKPHOS 80 11/07/2023   LIPASE 32 03/26/2021    Electrolytes Lab Results  Component Value Date   NA 138 11/07/2023   K 3.9 11/07/2023   CL 102 11/07/2023   CALCIUM 8.8 (L) 11/07/2023   MG 2.1 04/06/2023    Bone Lab Results  Component Value Date   VD25OH 28 (L) 08/23/2013    Inflammation (CRP: Acute Phase) (ESR: Chronic Phase) Lab Results  Component Value Date   CRP 1.5 (H) 06/21/2023   ESRSEDRATE 40 (H) 06/21/2023   LATICACIDVEN 1.8 04/03/2023         Note: Above Lab results reviewed.  Recent Imaging Review  CT HEAD W & WO CONTRAST ( ) CLINICAL DATA:  Squamous cell carcinoma of the face.  EXAM: CT HEAD WITHOUT AND WITH CONTRAST  TECHNIQUE: Contiguous axial images were obtained from the base of the skull through the vertex without and with intravenous contrast.  RADIATION DOSE REDUCTION: This exam was performed according to the departmental dose-optimization program which includes automated exposure  control, adjustment of the mA and/or kV according to patient size and/or use of iterative  reconstruction technique.  CONTRAST:  75mL OMNIPAQUE IOHEXOL 300 MG/ML  SOLN  COMPARISON:  Head CT 06/10/2022.  FINDINGS: Brain: No acute hemorrhage, mass effect or midline shift. Gray-white differentiation is preserved. No abnormal enhancement. No hydrocephalus or extra-axial collection. Basilar cisterns are patent.  Vascular: No hyperdense vessel or unexpected calcification. Visible vessels are patent.  Skull: No calvarial fracture or suspicious bone lesion. Skull base is unremarkable.  Sinuses/Orbits: No acute finding.  Other: None.  IMPRESSION: No acute intracranial abnormality or evidence of intracranial metastatic disease.  Electronically Signed   By: Orvan Falconer M.D.   On: 07/25/2023 12:33 CT SOFT TISSUE NECK W CONTRAST CLINICAL DATA:  Facial squamous cell carcinoma.  EXAM: CT NECK WITH CONTRAST  TECHNIQUE: Multidetector CT imaging of the neck was performed using the standard protocol following the bolus administration of intravenous contrast.  RADIATION DOSE REDUCTION: This exam was performed according to the departmental dose-optimization program which includes automated exposure control, adjustment of the mA and/or kV according to patient size and/or use of iterative reconstruction technique.  CONTRAST:  75mL OMNIPAQUE IOHEXOL 300 MG/ML  SOLN  COMPARISON:  Neck CT 04/03/2023.  FINDINGS: Pharynx and larynx: Normal. No mass or swelling.  Salivary glands: No inflammation, mass, or stone.  Thyroid: Normal.  Lymph nodes: No suspicious cervical lymphadenopathy.  Vascular: Atherosclerotic calcifications of the carotid bulbs.  Limited intracranial: Unremarkable.  Visualized orbits: Unremarkable.  Mastoids and visualized paranasal sinuses: Well aerated.  Skeleton: Unchanged heterogeneous sclerosis of the C7 vertebral body and T2 superior endplate.  Upper chest: No acute findings.  Other: None.  IMPRESSION: 1. No suspicious cervical  lymphadenopathy. 2. Unchanged heterogeneous sclerosis of the C7 vertebral body and T2 superior endplate.  Electronically Signed   By: Orvan Falconer M.D.   On: 07/25/2023 12:25 Note: Reviewed        Physical Exam  General appearance: Well nourished, well developed, and well hydrated. In no apparent acute distress Mental status: Alert, oriented x 3 (person, place, & time)       Respiratory: No evidence of acute respiratory distress Eyes: PERLA Vitals: There were no vitals taken for this visit. BMI: Estimated body mass index is 26.86 kg/m as calculated from the following:   Height as of 10/10/23: 5\' 7"  (1.702 m).   Weight as of 11/07/23: 171 lb 8 oz (77.8 kg). Ideal: Ideal body weight: 66.1 kg (145 lb 11.6 oz) Adjusted ideal body weight: 70.8 kg (156 lb 0.6 oz)  Assessment   Diagnosis Status  No diagnosis found. Controlled Controlled Controlled   Updated Problems: No problems updated.  Plan of Care  Problem-specific:  Assessment and Plan            Mr. LADELL LEA has a current medication list which includes the following long-term medication(s): oyster shell calcium w/d, carvedilol, eliquis, gabapentin, and omeprazole.  Pharmacotherapy (Medications Ordered): No orders of the defined types were placed in this encounter.  Orders:  No orders of the defined types were placed in this encounter.  Follow-up plan:   No follow-ups on file.      Interventional Therapies  Risk Factors  Considerations  Comorbidities ELIQUIS Anticoagulation (Stop: 3 days  Restart: 6 hours)  Multiple Myeloma  polypharmacy  High Dose opioid Therapy  Stage3b CKD  ascending aortic aneurysm  diastolic dysfunction  GERD  A-fib  nephrolithiasis  HTN  MV insufficiency     Planned  Pending:      Under consideration:   Diagnostic/therapeutic midline thoracolumbar LESI #1 (once the infection is cleared).   Completed:   None at this time   Therapeutic  Palliative (PRN)  options:   None established   Completed by other providers:   None reported       Recent Visits No visits were found meeting these conditions. Showing recent visits within past 90 days and meeting all other requirements Future Appointments Date Type Provider Dept  11/16/23 Appointment Delano Metz, MD [facility]  Showing future appointments within next 90 days and meeting all other requirements  I discussed the assessment and treatment plan with the patient. The patient was provided an opportunity to ask questions and all were answered. The patient agreed with the plan and demonstrated an understanding of the instructions.  Patient advised to call back or seek an in-person evaluation if the symptoms or condition worsens.  Duration of encounter: *** minutes.  Total time on encounter, as per AMA guidelines included both the face-to-face and non-face-to-face time personally spent by the physician and/or other qualified health care professional(s) on the day of the encounter (includes time in activities that require the physician or other qualified health care professional and does not include time in activities normally performed by clinical staff). Physician's time may include the following activities when performed: Preparing to see the patient (e.g., pre-charting review of records, searching for previously ordered imaging, lab work, and nerve conduction tests) Review of prior analgesic pharmacotherapies. Reviewing PMP Interpreting ordered tests (e.g., lab work, imaging, nerve conduction tests) Performing post-procedure evaluations, including interpretation of diagnostic procedures Obtaining and/or reviewing separately obtained history Performing a medically appropriate examination and/or evaluation Counseling and educating the patient/family/caregiver Ordering medications, tests, or procedures Referring and communicating with other health care professionals (when not  separately reported) Documenting clinical information in the electronic or other health record Independently interpreting results (not separately reported) and communicating results to the patient/ family/caregiver Care coordination (not separately reported)  Note by: Oswaldo Done, MD Date: 11/16/2023; Time: 8:44 AM

## 2023-11-16 ENCOUNTER — Ambulatory Visit: Payer: PPO | Attending: Pain Medicine | Admitting: Pain Medicine

## 2023-11-16 ENCOUNTER — Encounter: Payer: Self-pay | Admitting: Pain Medicine

## 2023-11-16 VITALS — BP 149/54 | HR 50 | Temp 97.2°F | Ht 67.0 in | Wt 171.0 lb

## 2023-11-16 DIAGNOSIS — C9000 Multiple myeloma not having achieved remission: Secondary | ICD-10-CM | POA: Insufficient documentation

## 2023-11-16 DIAGNOSIS — R937 Abnormal findings on diagnostic imaging of other parts of musculoskeletal system: Secondary | ICD-10-CM | POA: Insufficient documentation

## 2023-11-16 DIAGNOSIS — M549 Dorsalgia, unspecified: Secondary | ICD-10-CM | POA: Insufficient documentation

## 2023-11-16 DIAGNOSIS — Z7901 Long term (current) use of anticoagulants: Secondary | ICD-10-CM | POA: Insufficient documentation

## 2023-11-16 DIAGNOSIS — G8929 Other chronic pain: Secondary | ICD-10-CM | POA: Diagnosis not present

## 2023-11-16 DIAGNOSIS — S32020S Wedge compression fracture of second lumbar vertebra, sequela: Secondary | ICD-10-CM | POA: Insufficient documentation

## 2023-11-16 DIAGNOSIS — S32030S Wedge compression fracture of third lumbar vertebra, sequela: Secondary | ICD-10-CM | POA: Insufficient documentation

## 2023-11-16 DIAGNOSIS — S22080S Wedge compression fracture of T11-T12 vertebra, sequela: Secondary | ICD-10-CM | POA: Insufficient documentation

## 2023-11-16 DIAGNOSIS — S32010S Wedge compression fracture of first lumbar vertebra, sequela: Secondary | ICD-10-CM | POA: Insufficient documentation

## 2023-11-16 DIAGNOSIS — M8440XS Pathological fracture, unspecified site, sequela: Secondary | ICD-10-CM | POA: Diagnosis not present

## 2023-11-16 DIAGNOSIS — M545 Low back pain, unspecified: Secondary | ICD-10-CM | POA: Insufficient documentation

## 2023-11-16 DIAGNOSIS — Z9889 Other specified postprocedural states: Secondary | ICD-10-CM | POA: Insufficient documentation

## 2023-11-16 DIAGNOSIS — M8448XS Pathological fracture, other site, sequela: Secondary | ICD-10-CM | POA: Diagnosis not present

## 2023-11-16 DIAGNOSIS — Z139 Encounter for screening, unspecified: Secondary | ICD-10-CM | POA: Insufficient documentation

## 2023-11-16 NOTE — Patient Instructions (Signed)
 ______________________________________________________________________    Procedure instructions  Stop blood-thinners  Do not eat or drink fluids (other than water) for 6 hours before your procedure  No water for 2 hours before your procedure  Take your blood pressure medicine with a sip of water  Arrive 30 minutes before your appointment  If sedation is planned, bring suitable driver. Pennie Banter, Benedetto Goad, & public transportation are NOT APPROVED)  Carefully read the "Preparing for your procedure" detailed instructions  If you have questions call us at [phone]  Procedure appointments are for procedures only. NO medication refills or new problem evaluations.   ______________________________________________________________________      ______________________________________________________________________    Preparing for your procedure  Appointments: If you think you may not be able to keep your appointment, call 24-48 hours in advance to cancel. We need time to make it available to others.  Procedure visits are for procedures only. During your procedure appointment there will be: NO Prescription Refills*. NO medication changes or discussions*. NO discussion of disability issues*. NO unrelated pain problem evaluations*. NO evaluations to order other pain procedures*. *These will be addressed at a separate and distinct evaluation encounter on the provider's evaluation schedule and not during procedure days.  Instructions: Food intake: Avoid eating anything solid for at least 8 hours prior to your procedure. Clear liquid intake: You may take clear liquids such as water up to 2 hours prior to your procedure. (No carbonated drinks. No soda.) Transportation: Unless otherwise stated by your physician, bring a driver. (Driver cannot be a Market researcher, Pharmacist, community, or any other form of public transportation.) Morning Medicines: Except for blood thinners, take all of your other morning  medications with a sip of water. Make sure to take your heart and blood pressure medicines. If your blood pressure's lower number is above 100, the case will be rescheduled. Blood thinners: Make sure to stop your blood thinners as instructed.  If you take a blood thinner, but were not instructed to stop it, call our office [phone] and ask to talk to a nurse. Not stopping a blood thinner prior to certain procedures could lead to serious complications. Diabetics on insulin: Notify the staff so that you can be scheduled 1st case in the morning. If your diabetes requires high dose insulin, take only  of your normal insulin dose the morning of the procedure and notify the staff that you have done so. Preventing infections: Shower with an antibacterial soap the morning of your procedure.  Build-up your immune system: Take 1000 mg of Vitamin C with every meal (3 times a day) the day prior to your procedure. Antibiotics: Inform the nursing staff if you are taking any antibiotics or if you have any conditions that may require antibiotics prior to procedures. (Example: recent joint implants)   Pregnancy: If you are pregnant make sure to notify the nursing staff. Not doing so may result in injury to the fetus, including death.  Sickness: If you have a cold, fever, or any active infections, call and cancel or reschedule your procedure. Receiving steroids while having an infection may result in complications. Arrival: You must be in the facility at least 30 minutes prior to your scheduled procedure. Tardiness: Your scheduled time is also the cutoff time. If you do not arrive at least 15 minutes prior to your procedure, you will be rescheduled.  Children: Do not bring any children with you. Make arrangements to keep them home. Dress appropriately: There is always a possibility that your clothing may get  soiled. Avoid long dresses. Valuables: Do not bring any jewelry or valuables.  Reasons to call and  reschedule or cancel your procedure: (Following these recommendations will minimize the risk of a serious complication.) Surgeries: Avoid having procedures within 2 weeks of any surgery. (Avoid for 2 weeks before or after any surgery). Flu Shots: Avoid having procedures within 2 weeks of a flu shots or . (Avoid for 2 weeks before or after immunizations). Barium: Avoid having a procedure within 7-10 days after having had a radiological study involving the use of radiological contrast. (Myelograms, Barium swallow or enema study). Heart attacks: Avoid any elective procedures or surgeries for the initial 6 months after a "Myocardial Infarction" (Heart Attack). Blood thinners: It is imperative that you stop these medications before procedures. Let us know if you if you take any blood thinner.  Infection: Avoid procedures during or within two weeks of an infection (including chest colds or gastrointestinal problems). Symptoms associated with infections include: Localized redness, fever, chills, night sweats or profuse sweating, burning sensation when voiding, cough, congestion, stuffiness, runny nose, sore throat, diarrhea, nausea, vomiting, cold or Flu symptoms, recent or current infections. It is specially important if the infection is over the area that we intend to treat. Heart and lung problems: Symptoms that may suggest an active cardiopulmonary problem include: cough, chest pain, breathing difficulties or shortness of breath, dizziness, ankle swelling, uncontrolled high or unusually low blood pressure, and/or palpitations. If you are experiencing any of these symptoms, cancel your procedure and contact your primary care physician for an evaluation.  Remember:  Regular Business hours are:  Monday to Thursday 8:00 AM to 4:00 PM  Provider's Schedule: Delano Metz, MD:  Procedure days: Tuesday and Thursday 7:30 AM to 4:00 PM  Edward Jolly, MD:  Procedure days: Monday and Wednesday 7:30 AM to 4:00  PM Last  Updated: 08/23/2023 ______________________________________________________________________      ______________________________________________________________________    General Risks and Possible Complications  Patient Responsibilities: It is important that you read this as it is part of your informed consent. It is our duty to inform you of the risks and possible complications associated with treatments offered to you. It is your responsibility as a patient to read this and to ask questions about anything that is not clear or that you believe was not covered in this document.  Patient's Rights: You have the right to refuse treatment. You also have the right to change your mind, even after initially having agreed to have the treatment done. However, under this last option, if you wait until the last second to change your mind, you may be charged for the materials used up to that point.  Introduction: Medicine is not an Visual merchandiser. Everything in Medicine, including the lack of treatment(s), carries the potential for danger, harm, or loss (which is by definition: Risk). In Medicine, a complication is a secondary problem, condition, or disease that can aggravate an already existing one. All treatments carry the risk of possible complications. The fact that a side effects or complications occurs, does not imply that the treatment was conducted incorrectly. It must be clearly understood that these can happen even when everything is done following the highest safety standards.  No treatment: You can choose not to proceed with the proposed treatment alternative. The "PRO(s)" would include: avoiding the risk of complications associated with the therapy. The "CON(s)" would include: not getting any of the treatment benefits. These benefits fall under one of three categories: diagnostic; therapeutic; and/or  palliative. Diagnostic benefits include: getting information which can ultimately lead to  improvement of the disease or symptom(s). Therapeutic benefits are those associated with the successful treatment of the disease. Finally, palliative benefits are those related to the decrease of the primary symptoms, without necessarily curing the condition (example: decreasing the pain from a flare-up of a chronic condition, such as incurable terminal cancer).  General Risks and Complications: These are associated to most interventional treatments. They can occur alone, or in combination. They fall under one of the following six (6) categories: no benefit or worsening of symptoms; bleeding; infection; nerve damage; allergic reactions; and/or death. No benefits or worsening of symptoms: In Medicine there are no guarantees, only probabilities. No healthcare provider can ever guarantee that a medical treatment will work, they can only state the probability that it may. Furthermore, there is always the possibility that the condition may worsen, either directly, or indirectly, as a consequence of the treatment. Bleeding: This is more common if the patient is taking a blood thinner, either prescription or over the counter (example: Goody Powders, Fish oil, Aspirin, Garlic, etc.), or if suffering a condition associated with impaired coagulation (example: Hemophilia, cirrhosis of the liver, low platelet counts, etc.). However, even if you do not have one on these, it can still happen. If you have any of these conditions, or take one of these drugs, make sure to notify your treating physician. Infection: This is more common in patients with a compromised immune system, either due to disease (example: diabetes, cancer, human immunodeficiency virus [HIV], etc.), or due to medications or treatments (example: therapies used to treat cancer and rheumatological diseases). However, even if you do not have one on these, it can still happen. If you have any of these conditions, or take one of these drugs, make sure to notify  your treating physician. Nerve Damage: This is more common when the treatment is an invasive one, but it can also happen with the use of medications, such as those used in the treatment of cancer. The damage can occur to small secondary nerves, or to large primary ones, such as those in the spinal cord and brain. This damage may be temporary or permanent and it may lead to impairments that can range from temporary numbness to permanent paralysis and/or brain death. Allergic Reactions: Any time a substance or material comes in contact with our body, there is the possibility of an allergic reaction. These can range from a mild skin rash (contact dermatitis) to a severe systemic reaction (anaphylactic reaction), which can result in death. Death: In general, any medical intervention can result in death, most of the time due to an unforeseen complication. ______________________________________________________________________      ______________________________________________________________________    Blood Thinners  IMPORTANT NOTICE:  If you take any of these, make sure to notify the nursing staff.  Failure to do so may result in serious injury.  Recommended time intervals to stop and restart blood-thinners, before & after invasive procedures  Generic Name Brand Name Pre-procedure: Stop medication for this amount of time before your procedure: Post-procedure: Wait this amount of time after the procedure before restarting your medication:  Abciximab Reopro 15 days 2 hrs  Alteplase Activase 10 days 10 days  Anagrelide Agrylin    Apixaban Eliquis 3 days 6 hrs  Cilostazol Pletal 3 days 5 hrs  Clopidogrel Plavix 7-10 days 2 hrs  Dabigatran Pradaxa 5 days 6 hrs  Dalteparin Fragmin 24 hours 4 hrs  Dipyridamole Aggrenox 11days 2  hrs  Edoxaban [place] 3 days 2 hrs  Enoxaparin  Lovenox 24 hours 4 hrs  Eptifibatide Integrillin 8 hours 2 hrs  Fondaparinux  Arixtra 72 hours 12 hrs   Hydroxychloroquine Plaquenil 11 days   Prasugrel Effient 7-10 days 6 hrs  Reteplase Retavase 10 days 10 days  Rivaroxaban Xarelto 3 days 6 hrs  Ticagrelor Brilinta 5-7 days 6 hrs  Ticlopidine Ticlid 10-14 days 2 hrs  Tinzaparin Innohep 24 hours 4 hrs  Tirofiban Aggrastat 8 hours 2 hrs  Warfarin Coumadin 5 days 2 hrs   Other medications with blood-thinning effects  NOTE: Consider stopping these if you have prolonged bleeding despite not taking any of the above blood thinners. Otherwise ask your provider and this will be decided on a case-by-case basis.  Product indications Generic (Brand) names Note  Cholesterol Lipitor Stop 4 days before procedure  Blood thinner (injectable) Heparin (LMW or LMWH Heparin) Stop 24 hours before procedure  Cancer Ibrutinib (Imbruvica) Stop 7 days before procedure  Malaria/Rheumatoid Hydroxychloroquine (Plaquenil) Stop 11 days before procedure  Thrombolytics  10 days before or after procedures   Over-the-counter (OTC) Products with blood-thinning effects  NOTE: Consider stopping these if you have prolonged bleeding despite not taking any of the above blood thinners. Otherwise ask your provider and this will be decided on a case-by-case basis.  Product Common names Stop Time  Aspirin > 325 mg Goody Powders, Excedrin, etc. 11 days  Aspirin <= 81 mg  7 days  Fish oil  4 days  Garlic supplements  7 days  Ginkgo biloba  36 hours  Ginseng  24 hours  NSAIDs Ibuprofen, Naprosyn, etc. 3 days  Vitamin E  4 days   ______________________________________________________________________

## 2023-11-16 NOTE — Progress Notes (Unsigned)
 Safety precautions to be maintained throughout the outpatient stay will include: orient to surroundings, keep bed in low position, maintain call bell within reach at all times, provide assistance with transfer out of bed and ambulation.

## 2023-11-22 ENCOUNTER — Ambulatory Visit
Admission: RE | Admit: 2023-11-22 | Discharge: 2023-11-22 | Disposition: A | Discharge status: Home / Self Care | Source: Ambulatory Visit | Attending: Pain Medicine | Admitting: Pain Medicine

## 2023-11-22 ENCOUNTER — Encounter: Payer: Self-pay | Admitting: Pain Medicine

## 2023-11-22 ENCOUNTER — Ambulatory Visit: Attending: Pain Medicine | Admitting: Pain Medicine

## 2023-11-22 VITALS — BP 158/69 | HR 41 | Temp 97.3°F | Resp 11 | Ht 67.0 in | Wt 171.0 lb

## 2023-11-22 DIAGNOSIS — M546 Pain in thoracic spine: Secondary | ICD-10-CM | POA: Insufficient documentation

## 2023-11-22 DIAGNOSIS — C9000 Multiple myeloma not having achieved remission: Secondary | ICD-10-CM | POA: Insufficient documentation

## 2023-11-22 DIAGNOSIS — S22080S Wedge compression fracture of T11-T12 vertebra, sequela: Secondary | ICD-10-CM | POA: Diagnosis not present

## 2023-11-22 DIAGNOSIS — R001 Bradycardia, unspecified: Secondary | ICD-10-CM | POA: Diagnosis not present

## 2023-11-22 DIAGNOSIS — S32020S Wedge compression fracture of second lumbar vertebra, sequela: Secondary | ICD-10-CM | POA: Diagnosis not present

## 2023-11-22 DIAGNOSIS — M545 Low back pain, unspecified: Secondary | ICD-10-CM | POA: Insufficient documentation

## 2023-11-22 DIAGNOSIS — S32030S Wedge compression fracture of third lumbar vertebra, sequela: Secondary | ICD-10-CM | POA: Insufficient documentation

## 2023-11-22 DIAGNOSIS — S32010S Wedge compression fracture of first lumbar vertebra, sequela: Secondary | ICD-10-CM | POA: Insufficient documentation

## 2023-11-22 DIAGNOSIS — Z7901 Long term (current) use of anticoagulants: Secondary | ICD-10-CM | POA: Diagnosis not present

## 2023-11-22 DIAGNOSIS — M549 Dorsalgia, unspecified: Secondary | ICD-10-CM | POA: Diagnosis not present

## 2023-11-22 DIAGNOSIS — M8440XS Pathological fracture, unspecified site, sequela: Secondary | ICD-10-CM | POA: Insufficient documentation

## 2023-11-22 DIAGNOSIS — M8448XS Pathological fracture, other site, sequela: Secondary | ICD-10-CM | POA: Insufficient documentation

## 2023-11-22 MED ORDER — ROPIVACAINE HCL 2 MG/ML IJ SOLN
2.0000 mL | Freq: Once | INTRAMUSCULAR | Status: AC
  Administered 2023-11-22: 2 mL via EPIDURAL

## 2023-11-22 MED ORDER — LIDOCAINE HCL 2 % IJ SOLN
20.0000 mL | Freq: Once | INTRAMUSCULAR | Status: AC
Start: 2023-11-22 — End: 2023-11-22
  Administered 2023-11-22: 100 mg

## 2023-11-22 MED ORDER — IOHEXOL 180 MG/ML  SOLN
INTRAMUSCULAR | Status: AC
  Filled 2023-11-22: qty 10

## 2023-11-22 MED ORDER — SODIUM CHLORIDE (PF) 0.9 % IJ SOLN
INTRAMUSCULAR | Status: AC
  Filled 2023-11-22: qty 10

## 2023-11-22 MED ORDER — MIDAZOLAM HCL 5 MG/5ML IJ SOLN
0.5000 mg | Freq: Once | INTRAMUSCULAR | Status: AC
  Administered 2023-11-22: 1.5 mg via INTRAVENOUS

## 2023-11-22 MED ORDER — PENTAFLUOROPROP-TETRAFLUOROETH EX AERO
INHALATION_SPRAY | Freq: Once | CUTANEOUS | Status: AC
Start: 2023-11-22 — End: 2023-11-22
  Administered 2023-11-22: 30 via TOPICAL

## 2023-11-22 MED ORDER — SODIUM CHLORIDE 0.9% FLUSH
2.0000 mL | Freq: Once | INTRAVENOUS | Status: AC
Start: 2023-11-22 — End: 2023-11-22
  Administered 2023-11-22: 2 mL

## 2023-11-22 MED ORDER — GLYCOPYRROLATE 0.2 MG/ML IJ SOLN
0.2000 mg | Freq: Once | INTRAMUSCULAR | Status: AC
  Administered 2023-11-22: 0.2 mg via INTRAVENOUS

## 2023-11-22 MED ORDER — FENTANYL CITRATE (PF) 100 MCG/2ML IJ SOLN: 25.0000 ug | INTRAMUSCULAR | Status: DC | PRN

## 2023-11-22 MED ORDER — TRIAMCINOLONE ACETONIDE 40 MG/ML IJ SUSP
INTRAMUSCULAR | Status: AC
  Filled 2023-11-22: qty 1

## 2023-11-22 MED ORDER — FENTANYL CITRATE (PF) 100 MCG/2ML IJ SOLN
INTRAMUSCULAR | Status: AC
Start: 2023-11-22 — End: ?
  Filled 2023-11-22: qty 2

## 2023-11-22 MED ORDER — MIDAZOLAM HCL 5 MG/5ML IJ SOLN
INTRAMUSCULAR | Status: AC
  Filled 2023-11-22: qty 5

## 2023-11-22 MED ORDER — IOHEXOL 180 MG/ML  SOLN
10.0000 mL | Freq: Once | INTRAMUSCULAR | Status: AC
Start: 2023-11-22 — End: 2023-11-22
  Administered 2023-11-22: 10 mL via EPIDURAL

## 2023-11-22 MED ORDER — TRIAMCINOLONE ACETONIDE 40 MG/ML IJ SUSP
40.0000 mg | Freq: Once | INTRAMUSCULAR | Status: AC
  Administered 2023-11-22: 40 mg

## 2023-11-22 MED ORDER — ROPIVACAINE HCL 2 MG/ML IJ SOLN
INTRAMUSCULAR | Status: AC
  Filled 2023-11-22: qty 20

## 2023-11-22 MED ORDER — LIDOCAINE HCL (PF) 2 % IJ SOLN
INTRAMUSCULAR | Status: AC
  Filled 2023-11-22: qty 10

## 2023-11-22 NOTE — Patient Instructions (Signed)
 ______________________________________________________________________    Blood Thinners  IMPORTANT NOTICE:  If you take any of these, make sure to notify the nursing staff.  Failure to do so may result in serious injury.  Recommended time intervals to stop and restart blood-thinners, before & after invasive procedures  Generic Name Brand Name Pre-procedure: Stop medication for this amount of time before your procedure: Post-procedure: Wait this amount of time after the procedure before restarting your medication:  Abciximab Reopro 15 days 2 hrs  Alteplase Activase 10 days 10 days  Anagrelide Agrylin    Apixaban Eliquis 3 days 6 hrs  Cilostazol Pletal 3 days 5 hrs  Clopidogrel Plavix 7-10 days 2 hrs  Dabigatran Pradaxa 5 days 6 hrs  Dalteparin Fragmin 24 hours 4 hrs  Dipyridamole Aggrenox 11days 2 hrs  Edoxaban [place]; [place]a 3 days 2 hrs  Enoxaparin  Lovenox 24 hours 4 hrs  Eptifibatide Integrillin 8 hours 2 hrs  Fondaparinux  Arixtra 72 hours 12 hrs  Hydroxychloroquine Plaquenil 11 days   Prasugrel Effient 7-10 days 6 hrs  Reteplase Retavase 10 days 10 days  Rivaroxaban Xarelto 3 days 6 hrs  Ticagrelor Brilinta 5-7 days 6 hrs  Ticlopidine Ticlid 10-14 days 2 hrs  Tinzaparin Innohep 24 hours 4 hrs  Tirofiban Aggrastat 8 hours 2 hrs  Warfarin Coumadin 5 days 2 hrs   Other medications with blood-thinning effects  NOTE: Consider stopping these if you have prolonged bleeding despite not taking any of the above blood thinners. Otherwise ask your provider and this will be decided on a case-by-case basis.  Product indications Generic (Brand) names Note  Cholesterol Lipitor Stop 4 days before procedure  Blood thinner (injectable) Heparin (LMW or LMWH Heparin) Stop 24 hours before procedure  Cancer Ibrutinib (Imbruvica) Stop 7 days before procedure  Malaria/Rheumatoid Hydroxychloroquine (Plaquenil) Stop 11 days before procedure  Thrombolytics  10 days before or after procedures    Over-the-counter (OTC) Products with blood-thinning effects  NOTE: Consider stopping these if you have prolonged bleeding despite not taking any of the above blood thinners. Otherwise ask your provider and this will be decided on a case-by-case basis.  Product Common names Stop Time  Aspirin > 325 mg Goody Powders, Excedrin, etc. 11 days  Aspirin <= 81 mg  7 days  Fish oil  4 days  Garlic supplements  7 days  Ginkgo biloba  36 hours  Ginseng  24 hours  NSAIDs Ibuprofen, Naprosyn, etc. 3 days  Vitamin E  4 days   ______________________________________________________________________      ______________________________________________________________________    Post-Procedure Discharge Instructions  Instructions: Apply ice:  Purpose: This will minimize any swelling and discomfort after procedure.  When: Day of procedure, as soon as you get home. How: Fill a plastic sandwich bag with crushed ice. Cover it with a small towel and apply to injection site. How long: (15 min on, 15 min off) Apply for 15 minutes then remove x 15 minutes.  Repeat sequence on day of procedure, until you go to bed. Apply heat:  Purpose: To treat any soreness and discomfort from the procedure. When: Starting the next day after the procedure. How: Apply heat to procedure site starting the day following the procedure. How long: May continue to repeat daily, until discomfort goes away. Food intake: Start with clear liquids (like water) and advance to regular food, as tolerated.  Physical activities: Keep activities to a minimum for the first 8 hours after the procedure. After that, then as tolerated. Driving: If you have  received any sedation, be responsible and do not drive. You are not allowed to drive for 24 hours after having sedation. Blood thinner: (Applies only to those taking blood thinners) You may restart your blood thinner 6 hours after your procedure. Insulin: (Applies only to Diabetic patients  taking insulin) As soon as you can eat, you may resume your normal dosing schedule. Infection prevention: Keep procedure site clean and dry. Shower daily and clean area with soap and water. Post-procedure Pain Diary: Extremely important that this be done correctly and accurately. Recorded information will be used to determine the next step in treatment. For the purpose of accuracy, follow these rules: Evaluate only the area treated. Do not report or include pain from an untreated area. For the purpose of this evaluation, ignore all other areas of pain, except for the treated area. After your procedure, avoid taking a long nap and attempting to complete the pain diary after you wake up. Instead, set your alarm clock to go off every hour, on the hour, for the initial 8 hours after the procedure. Document the duration of the numbing medicine, and the relief you are getting from it. Do not go to sleep and attempt to complete it later. It will not be accurate. If you received sedation, it is likely that you were given a medication that may cause amnesia. Because of this, completing the diary at a later time may cause the information to be inaccurate. This information is needed to plan your care. Follow-up appointment: Keep your post-procedure follow-up evaluation appointment after the procedure (usually 2 weeks for most procedures, 6 weeks for radiofrequencies). DO NOT FORGET to bring you pain diary with you.   Expect: (What should I expect to see with my procedure?) From numbing medicine (AKA: Local Anesthetics): Numbness or decrease in pain. You may also experience some weakness, which if present, could last for the duration of the local anesthetic. Onset: Full effect within 15 minutes of injected. Duration: It will depend on the type of local anesthetic used. On the average, 1 to 8 hours.  From steroids (Applies only if steroids were used): Decrease in swelling or inflammation. Once inflammation is  improved, relief of the pain will follow. Onset of benefits: Depends on the amount of swelling present. The more swelling, the longer it will take for the benefits to be seen. In some cases, up to 10 days. Duration: Steroids will stay in the system x 2 weeks. Duration of benefits will depend on multiple posibilities including persistent irritating factors. Side-effects: If present, they may typically last 2 weeks (the duration of the steroids). Frequent: Cramps (if they occur, drink Gatorade and take over-the-counter Magnesium 450-500 mg once to twice a day); water retention with temporary weight gain; increases in blood sugar; decreased immune system response; increased appetite. Occasional: Facial flushing (red, warm cheeks); mood swings; menstrual changes. Uncommon: Long-term decrease or suppression of natural hormones; bone thinning. (These are more common with higher doses or more frequent use. This is why we prefer that our patients avoid having any injection therapies in other practices.)  Very Rare: Severe mood changes; psychosis; aseptic necrosis. From procedure: Some discomfort is to be expected once the numbing medicine wears off. This should be minimal if ice and heat are applied as instructed.  Call if: (When should I call?) You experience numbness and weakness that gets worse with time, as opposed to wearing off. New onset bowel or bladder incontinence. (Applies only to procedures done in the spine)  Emergency Numbers: Durning business hours (Monday - Thursday, 8:00 AM - 4:00 PM) (Friday, 9:00 AM - 12:00 Noon): [phone] After hours: [phone] NOTE: If you are having a problem and are unable connect with, or to talk to a provider, then go to your nearest urgent care or emergency department. If the problem is serious and urgent, please call 911. ______________________________________________________________________

## 2023-11-22 NOTE — Progress Notes (Signed)
 PROVIDER NOTE: Interpretation of information contained herein should be left to medically-trained personnel. Specific patient instructions are provided elsewhere under "Patient Instructions" section of medical record. This document was created in part using STT-dictation technology, any transcriptional errors that may result from this process are unintentional.  Patient: Jack [name] Type: Established DOB: 16-Oct-1932 MRN: [id number] PCP: Dorothey Baseman, MD  Service: Procedure DOS: 11/22/2023 Setting: Ambulatory Location: Ambulatory outpatient facility Delivery: Face-to-face Provider: Oswaldo Done, MD Specialty: Interventional Pain Management Specialty designation: 09 Location: Outpatient facility Ref. Prov.: Dorothey Baseman, MD       Interventional Therapy   Type: Lumbar epidural steroid injection (LESI) (interlaminar) #1    Laterality: Midline   Level:  T12-L1 Level.  Imaging: Fluoroscopic guidance Spinal ([id number]) Anesthesia: Local anesthesia (1-2% Lidocaine) Anxiolysis: IV Versed 1.5 mg Sedation: Moderate Sedation None required. No Fentanyl administered.         DOS: 11/22/2023  Performed by: Oswaldo Done, MD  Purpose: Diagnostic/Therapeutic Indications: Lumbar radicular pain of intraspinal etiology of more than 4 weeks that has failed to respond to conservative therapy and is severe enough to impact quality of life or function. 1. Multilevel spine pain   2. Multiple myeloma, remission status unspecified (HCC)   3. Multiple pathological fractures, sequela   4. Compression fracture of T11 vertebra, sequela   5. Compression fracture of T12 vertebra, sequela   6. Compression fracture of L1 lumbar vertebra, sequela   7. Compression fracture of L2 lumbar vertebra, sequela   8. Compression fracture of L3 lumbar vertebra, sequela   9. Lumbar spine pain   10. Pathologic fracture of thoracic vertebrae, sequela   11. Pathologic lumbar vertebral fracture, sequela    12. Thoracic spine pain   13. Chronic anticoagulation (Eliquis)    NAS-11 Pain score:   Pre-procedure: 6 /10   Post-procedure: 0-No pain/10      Position / Prep / Materials:  Position: Prone w/ head of the table raised (slight reverse trendelenburg) to facilitate breathing.  Prep solution: ChloraPrep (2% chlorhexidine gluconate and 70% isopropyl alcohol) Prep Area: Entire Posterior Lumbar Region from lower scapular tip down to mid buttocks area and from flank to flank. Materials:  Tray: Epidural tray Needle(s):  Type: Epidural needle (Tuohy) Gauge (G):  17 Length: Regular (3.5-in) Qty: 1   H&P (Pre-op Assessment):  Jack [name] is a 89 y.o. (year old), male patient, seen today for interventional treatment. 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 (jan 2009); Small intestine surgery (1946); Cystoscopy w/ ureteral stent placement (Right, 10/16/2017); Kidney stone surgery; Colon surgery; Tonsillectomy; Cataract extraction w/PHACO (Left, 01/10/2018); Cataract extraction w/PHACO (Right, 01/25/2018); Extracorporeal shock wave lithotripsy (Right, 12/11/2020); Cystoscopy/ureteroscopy/holmium laser/stent placement (Right, 12/16/2020); IR KYPHO LUMBAR INC FX REDUCE BONE BX UNI/BIL CANNULATION INC/IMAGING (02/02/2021); IR KYPHO EA ADDL LEVEL THORACIC OR LUMBAR (02/02/2021); Kyphoplasty (N/A, 03/12/2021); IR KYPHO THORACIC WITH BONE BIOPSY (04/15/2022); and IR Bone Tumor(s)RF Ablation (04/16/2022). Jack [name] has a current medication list which includes the following prescription(s): alpha-lipoic acid, vitamin c, oyster shell calcium w/d, carvedilol, cyanocobalamin, dexamethasone, eliquis, gabapentin, ibuprofen, lenalidomide, lidocaine, morphine, multivitamin with minerals, multiple vitamins-minerals, naloxone, omeprazole, oxycodone hcl, polyethylene glycol, potassium citrate, and senna-docusate, and the following Facility-Administered Medications: fentanyl. His  primarily concern today is the Back Pain  Initial Vital Signs:  Pulse/HCG Rate: (!) 41ECG Heart Rate: (!) 50 (SB) Temp: (!) 97.5 F (36.4 C) Resp: 18 BP: (!) 155/63 SpO2: 96 %  BMI: Estimated body  mass index is 26.78 kg/m as calculated from the following:   Height as of this encounter: 5\' 7"  (1.702 m).   Weight as of this encounter: 171 lb (77.6 kg).  Risk Assessment: Allergies: Reviewed. He is allergic to quinolones, amlodipine, azithromycin, lipitor [atorvastatin], lisinopril, zetia [ezetimibe], and hydromorphone hcl.  Allergy Precautions: None required Coagulopathies: Reviewed. None identified.  Blood-thinner therapy: None at this time Active Infection(s): Reviewed. None identified. Jack [name] is afebrile  Site Confirmation: Jack [name] was asked to confirm the procedure and laterality before marking the site Procedure checklist: Completed Consent: Before the procedure and under the influence of no sedative(s), amnesic(s), or anxiolytics, the patient was informed of the treatment options, risks and possible complications. To fulfill our ethical and legal obligations, as recommended by the American Medical Association's Code of Ethics, I have informed the patient of my clinical impression; the nature and purpose of the treatment or procedure; the risks, benefits, and possible complications of the intervention; the alternatives, including doing nothing; the risk(s) and benefit(s) of the alternative treatment(s) or procedure(s); and the risk(s) and benefit(s) of doing nothing. The patient was provided information about the general risks and possible complications associated with the procedure. These may include, but are not limited to: failure to achieve desired goals, infection, bleeding, organ or nerve damage, allergic reactions, paralysis, and death. In addition, the patient was informed of those risks and complications associated to Spine-related procedures, such as failure to decrease  pain; infection (i.e.: Meningitis, epidural or intraspinal abscess); bleeding (i.e.: epidural hematoma, subarachnoid hemorrhage, or any other type of intraspinal or peri-dural bleeding); organ or nerve damage (i.e.: Any type of peripheral nerve, nerve root, or spinal cord injury) with subsequent damage to sensory, motor, and/or autonomic systems, resulting in permanent pain, numbness, and/or weakness of one or several areas of the body; allergic reactions; (i.e.: anaphylactic reaction); and/or death. Furthermore, the patient was informed of those risks and complications associated with the medications. These include, but are not limited to: allergic reactions (i.e.: anaphylactic or anaphylactoid reaction(s)); adrenal axis suppression; blood sugar elevation that in diabetics may result in ketoacidosis or comma; water retention that in patients with history of congestive heart failure may result in shortness of breath, pulmonary edema, and decompensation with resultant heart failure; weight gain; swelling or edema; medication-induced neural toxicity; particulate matter embolism and blood vessel occlusion with resultant organ, and/or nervous system infarction; and/or aseptic necrosis of one or more joints. Finally, the patient was informed that Medicine is not an exact science; therefore, there is also the possibility of unforeseen or unpredictable risks and/or possible complications that may result in a catastrophic outcome. The patient indicated having understood very clearly. We have given the patient no guarantees and we have made no promises. Enough time was given to the patient to ask questions, all of which were answered to the patient's satisfaction. Mr. Bulnes has indicated that he wanted to continue with the procedure. Attestation: I, the ordering provider, attest that I have discussed with the patient the benefits, risks, side-effects, alternatives, likelihood of achieving goals, and potential problems  during recovery for the procedure that I have provided informed consent. Date  Time: 11/22/2023  8:51 AM   Pre-Procedure Preparation:  Monitoring: As per clinic protocol. Respiration, ETCO2, SpO2, BP, heart rate and rhythm monitor placed and checked for adequate function Safety Precautions: Patient was assessed for positional comfort and pressure points before starting the procedure. Time-out: I initiated and conducted the "Time-out" before starting the procedure, as per  protocol. The patient was asked to participate by confirming the accuracy of the "Time Out" information. Verification of the correct person, site, and procedure were performed and confirmed by me, the nursing staff, and the patient. "Time-out" conducted as per Joint Commission's Universal Protocol (UP.01.01.01). Time: 1017 Start Time: 1017 hrs.  Description/Narrative of Procedure:          Target: Epidural space via interlaminar opening, initially targeting the lower laminar border of the superior vertebral body. Region: Lumbar Approach: Percutaneous paravertebral  Rationale (medical necessity): procedure needed and proper for the diagnosis and/or treatment of the patient's medical symptoms and needs. Procedural Technique Safety Precautions: Aspiration looking for blood return was conducted prior to all injections. At no point did we inject any substances, as a needle was being advanced. No attempts were made at seeking any paresthesias. Safe injection practices and needle disposal techniques used. Medications properly checked for expiration dates. SDV (single dose vial) medications used. Description of the Procedure: Protocol guidelines were followed. The procedure needle was introduced through the skin, ipsilateral to the reported pain, and advanced to the target area. Bone was contacted and the needle walked caudad, until the lamina was cleared. The epidural space was identified using "loss-of-resistance technique" with 2-3 ml  of PF-NaCl (0.9% NSS), in a 5cc LOR glass syringe.  Vitals:   11/22/23 1031 11/22/23 1038 11/22/23 1048 11/22/23 1100  BP: (!) 157/89 (!) 154/78 (!) 170/85 (!) 158/69  Pulse:      Resp: 15 (!) 9 13 11   Temp:  98.2 F (36.8 C)  (!) 97.3 F (36.3 C)  TempSrc:  Temporal  Temporal  SpO2: 98% 97% 96% 100%  Weight:      Height:        Start Time: 1017 hrs. End Time: 1030 hrs.  Imaging Guidance (Spinal):          Type of Imaging Technique: Fluoroscopy Guidance (Spinal) Indication(s): Fluoroscopy guidance for needle placement to enhance accuracy in procedures requiring precise needle localization for targeted delivery of medication in or near specific anatomical locations not easily accessible without such real-time imaging assistance. Exposure Time: Please see nurses notes. Contrast: Before injecting any contrast, we confirmed that the patient did not have an allergy to iodine, shellfish, or radiological contrast. Once satisfactory needle placement was completed at the desired level, radiological contrast was injected. Contrast injected under live fluoroscopy. No contrast complications. See chart for type and volume of contrast used. Fluoroscopic Guidance: I was personally present during the use of fluoroscopy. "Tunnel Vision Technique" used to obtain the best possible view of the target area. Parallax error corrected before commencing the procedure. "Direction-depth-direction" technique used to introduce the needle under continuous pulsed fluoroscopy. Once target was reached, antero-posterior, oblique, and lateral fluoroscopic projection used confirm needle placement in all planes. Images permanently stored in EMR. Interpretation: I personally interpreted the imaging intraoperatively. Adequate needle placement confirmed in multiple planes. Appropriate spread of contrast into desired area was observed. No evidence of afferent or efferent intravascular uptake. No intrathecal or subarachnoid spread  observed. Permanent images saved into the patient's record.  Antibiotic Prophylaxis:   Anti-infectives (From admission, onward)    None      Indication(s): None identified  Post-operative Assessment:  Post-procedure Vital Signs:  Pulse/HCG Rate: (!) 41(!) 48 Temp: (!) 97.3 F (36.3 C) Resp: 11 BP: (!) 158/69 SpO2: 100 %  EBL: None  Complications: No immediate post-treatment complications observed by team, or reported by patient.  Note: The patient tolerated the  entire procedure well. A repeat set of vitals were taken after the procedure and the patient was kept under observation following institutional policy, for this type of procedure. Post-procedural neurological assessment was performed, showing return to baseline, prior to discharge. The patient was provided with post-procedure discharge instructions, including a section on how to identify potential problems. Should any problems arise concerning this procedure, the patient was given instructions to immediately contact us, at any time, without hesitation. In any case, we plan to contact the patient by telephone for a follow-up status report regarding this interventional procedure.  Comments:  No additional relevant information.  Plan of Care (POC)  Orders:  Orders Placed This Encounter  Procedures   Lumbar Epidural Injection    Scheduling Instructions:     Procedure: Interlaminar LESI T12-L1     Laterality: Midline     Sedation: With Sedation     Timeframe: Today    Where will this procedure be performed?:   [facility]   [facility] C-ARM 1-60 MIN NO REPORT    Intraoperative interpretation by procedural physician at [facility].    Standing Status:   Standing    Number of Occurrences:   1    Reason for exam::   Assistance in needle guidance and placement for procedures requiring needle placement in or near specific anatomical locations not easily accessible without such assistance.   Informed  Consent Details: Physician/Practitioner Attestation; Transcribe to consent form and obtain patient signature    Note: Always confirm laterality of pain with Mr. Habib, before procedure. Transcribe to consent form and obtain patient signature.    Physician/Practitioner attestation of informed consent for procedure/surgical case:   I, the physician/practitioner, attest that I have discussed with the patient the benefits, risks, side effects, alternatives, likelihood of achieving goals and potential problems during recovery for the procedure that I have provided informed consent.    Procedure:   Lumbar epidural steroid injection under fluoroscopic guidance    Physician/Practitioner performing the procedure:   Delrose Rohwer A. Laban Emperor, MD    Indication/Reason:   Low back and/or lower extremity pain secondary to lumbar radiculitis   Care order/instruction: Please confirm that the patient has stopped the Eliquis (Apixaban) x 3 days prior to procedure or surgery.    Please confirm that the patient has stopped the Eliquis (Apixaban) x 3 days prior to procedure or surgery.    Standing Status:   Standing    Number of Occurrences:   1   Provide equipment / supplies at bedside    Procedural tray: Epidural Tray (Disposable  single use) Skin infiltration needle: Regular 1.5-in, 25-G, (x1) Block needle size: Regular standard Catheter: No catheter required    Standing Status:   Standing    Number of Occurrences:   1    Specify:   Epidural Tray   Saline lock IV    Have LR [phone] mL available and administer at 125 mL/hr if patient becomes hypotensive.    Standing Status:   Standing    Number of Occurrences:   1   Bleeding precautions    Standing Status:   Standing    Number of Occurrences:   1   Chronic Opioid Analgesic:   No chronic opioid analgesics therapy prescribed by our practice. oxycodone IR 10 mg tablet, 2 tabs p.o. 3 times daily (# 90) (last filled on 03/11/2023) (90 MME); morphine ER 30 mg tablet,  1 tab p.o. 3 times daily (# 90) (last filled on  03/24/2023) (90 MME) by Jeralyn Ruths, MD (Oncology)  MME/day: 180 mg/day   Medications ordered for procedure: Meds ordered this encounter  Medications   iohexol (OMNIPAQUE) 180 MG/ML injection 10 mL    Must be Myelogram-compatible. If not available, you may substitute with a water-soluble, non-ionic, hypoallergenic, myelogram-compatible radiological contrast medium.   lidocaine (XYLOCAINE) 2 % (with pres) injection 400 mg   pentafluoroprop-tetrafluoroeth (GEBAUERS) aerosol   midazolam (VERSED) 5 MG/5ML injection 0.5-2 mg    Make sure Flumazenil is available in the pyxis when using this medication. If oversedation occurs, administer 0.2 mg IV over 15 sec. If after 45 sec no response, administer 0.2 mg again over 1 min; may repeat at 1 min intervals; not to exceed 4 doses (1 mg)   fentaNYL (SUBLIMAZE) injection 25-50 mcg    Make sure Narcan is available in the pyxis when using this medication. In the event of respiratory depression (RR< 8/min): Titrate NARCAN (naloxone) in increments of 0.1 to 0.2 mg IV at 2-3 minute intervals, until desired degree of reversal.   sodium chloride flush (NS) 0.9 % injection 2 mL   ropivacaine (PF) 2 mg/mL (0.2%) (NAROPIN) injection 2 mL   triamcinolone acetonide (KENALOG-40) injection 40 mg   glycopyrrolate (ROBINUL) injection 0.2 mg   Medications administered: We administered iohexol, lidocaine, pentafluoroprop-tetrafluoroeth, midazolam, sodium chloride flush, ropivacaine (PF) 2 mg/mL (0.2%), triamcinolone acetonide, and glycopyrrolate.  See the medical record for exact dosing, route, and time of administration.  Follow-up plan:   Return in about 2 weeks (around 12/06/2023) for (Face2F), (PPE).       Interventional Therapies  Risk Factors  Considerations  Comorbidities ELIQUIS Anticoagulation (Stop: 3 days  Restart: 6 hours)  Multiple Myeloma  polypharmacy  High Dose opioid Therapy  Stage3b CKD   ascending aortic aneurysm  diastolic dysfunction  GERD  A-fib  nephrolithiasis  HTN  MV insufficiency     Planned  Pending:   Diagnostic/therapeutic midline thoracolumbar LESI #1    Under consideration:   Diagnostic/therapeutic midline thoracolumbar LESI #1    Completed:   None at this time   Therapeutic  Palliative (PRN) options:   None established   Completed by other providers:   None reported      Recent Visits Date Type Provider Dept  11/16/23 Office Visit Delano Metz, MD [facility]  Showing recent visits within past 90 days and meeting all other requirements Today's Visits Date Type Provider Dept  11/22/23 Procedure visit Delano Metz, MD [facility]  Showing today's visits and meeting all other requirements Future Appointments Date Type Provider Dept  12/13/23 Appointment Delano Metz, MD [facility]  Showing future appointments within next 90 days and meeting all other requirements  Disposition: Discharge home  Discharge (Date  Time): 11/22/2023; 1106 hrs.   Primary Care Physician: Dorothey Baseman, MD Location: [facility] Note by: Oswaldo Done, MD (TTS technology used. I apologize for any typographical errors that were not detected and corrected.) Date: 11/22/2023; Time: 11:17 AM  Disclaimer:  Medicine is not an Visual merchandiser. The only guarantee in medicine is that nothing is guaranteed. It is important to note that the decision to proceed with this intervention was based on the information collected from the patient. The Data and conclusions were drawn from the patient's questionnaire, the interview, and the physical examination. Because the information was provided in large part by the patient, it cannot be guaranteed that it has not been purposely or unconsciously  manipulated. Every effort has been made to obtain as much relevant data as possible for this evaluation. It  is important to note that the conclusions that lead to this procedure are derived in large part from the available data. Always take into account that the treatment will also be dependent on availability of resources and existing treatment guidelines, considered by other Pain Management Practitioners as being common knowledge and practice, at the time of the intervention. For Medico-Legal purposes, it is also important to point out that variation in procedural techniques and pharmacological choices are the acceptable norm. The indications, contraindications, technique, and results of the above procedure should only be interpreted and judged by a Board-Certified Interventional Pain Specialist with extensive familiarity and expertise in the same exact procedure and technique.

## 2023-11-22 NOTE — Progress Notes (Signed)
 Safety precautions to be maintained throughout the outpatient stay will include: orient to surroundings, keep bed in low position, maintain call bell within reach at all times, provide assistance with transfer out of bed and ambulation.

## 2023-11-23 ENCOUNTER — Other Ambulatory Visit: Payer: Self-pay | Admitting: Urology

## 2023-11-23 ENCOUNTER — Ambulatory Visit: Payer: Self-pay | Admitting: *Deleted

## 2023-11-23 ENCOUNTER — Telehealth: Payer: Self-pay

## 2023-11-23 DIAGNOSIS — R200 Anesthesia of skin: Secondary | ICD-10-CM | POA: Diagnosis not present

## 2023-11-23 DIAGNOSIS — N1832 Chronic kidney disease, stage 3b: Secondary | ICD-10-CM | POA: Diagnosis not present

## 2023-11-23 DIAGNOSIS — C9000 Multiple myeloma not having achieved remission: Secondary | ICD-10-CM | POA: Diagnosis not present

## 2023-11-23 NOTE — Patient Instructions (Signed)
 Visit Information  Thank you for taking time to visit with me today. Please don't hesitate to contact me if I can be of assistance to you before our next scheduled telephone appointment.  Following are the goals we discussed today:   Encourage to continue to check blood pressure and heart rate Keep and attend all upcoming appointments Recent Creatinine level was elevated, please review attached education sheet concerning creatinine levels.    Our next appointment is by telephone on Monday, May 12 at 11:00am.  Please call the care guide team at [phone] if you need to cancel or reschedule your appointment.   Please call the Suicide and Crisis Lifeline: 988 if you are experiencing a Mental Health or Behavioral Health Crisis or need someone to talk to.  Patient verbalizes understanding of instructions and care plan provided today and agrees to view in MyChart. Active MyChart status and patient understanding of how to access instructions and care plan via MyChart confirmed with patient.     The patient has been provided with contact information for the care management team and has been advised to call with any health related questions or concerns.   Rodney Langton, RN, MSN, CCM [facility], [facility] RN Care Coordinator Direct Dial: [phone] / Main [phone] Fax [phone] Email: [email or web address]: [email or web address]

## 2023-11-23 NOTE — Patient Outreach (Signed)
 Care Coordination   Follow Up Visit Note   11/23/2023 Name: Jack [name] MRN: [id number] DOB: 1933/05/16  Jack [name] is a 89 y.o. year old male who sees Dorothey Baseman, MD for primary care. I spoke with  Jack [name] by phone today.  What matters to the patients health and wellness today?  Patient states he is well today, had a back injection yesterday, rates pain at 3/10, much improved since before injection.  His goal is to get off of some of the pain pills in the future.  Working with [employer] [facility], taking a holistic approach with Neuropathy management.      Goals Addressed             This Visit's Progress    Management of chronic conditions   On track    Interventions Today    Flowsheet Row Most Recent Value  Chronic Disease   Chronic disease during today's visit Hypertension (HTN), Other  [Chronic back pain, Neuropathy]  General Interventions   General Interventions Discussed/Reviewed General Interventions Discussed, General Interventions Reviewed, Doctor Visits, Durable Medical Equipment (DME), Labs  [Pain assessed, rates pain today at 3/10.  States pain is better since receiving injection yesterday (11/22/23).]  Labs Kidney Function  [Discussed elevated creatinine, med doses were changed due to labs, will be monitored at Oncology.]  Doctor Visits Discussed/Reviewed Doctor Visits Reviewed, PCP, Specialist  [3/12 Neuro, 3/14 Retina Specialist, 3/24 Oncology, 4/1 Pain, 4/11 NP Oncology, 4/28 PCP]  Durable Medical Equipment (DME) BP Cuff  [Monitoring BP's with ranges of 140-150's/80's, HR in 40's at baseline.]  PCP/Specialist Visits Compliance with follow-up visit  Education Interventions   Education Provided Provided Education  Provided Verbal Education On Medication, Other  [Active with [facility], meds reviewed, will start a decreased dose of cancer treatment this month due to cancer markers decreasing.]                 SDOH assessments and interventions completed:  No     Care Coordination Interventions:  Yes, provided   Follow up plan: Follow up call scheduled for Monday, May 12, at 11am.   Encounter Outcome:  Patient Visit Completed   Rodney Langton, RN, MSN, CCM   [facility], [facility] RN Care Coordinator Direct Dial: [phone] / Main [phone] Fax [phone] Email: [email or web address]: [email or web address]

## 2023-11-23 NOTE — Patient Outreach (Signed)
 Care Coordination   11/23/2023 Name: Jack [name] MRN: [id number] DOB: Dec 23, 1932   Care Coordination Outreach Attempts:  An unsuccessful outreach was attempted for an appointment today.  Follow Up Plan:  Additional outreach attempts will be made to offer the patient complex care management information and services.   Encounter Outcome:  No Answer   Care Coordination Interventions:  No, not indicated    Rodney Langton, RN, MSN, CCM [place]  [facility], [facility] RN Care Coordinator Direct Dial: [phone] / Main [phone] Fax [phone] Email: [email or web address]

## 2023-11-23 NOTE — Telephone Encounter (Signed)
 Post procedure follow up.  Patient wife states he is doing well.

## 2023-11-25 DIAGNOSIS — H353231 Exudative age-related macular degeneration, bilateral, with active choroidal neovascularization: Secondary | ICD-10-CM | POA: Diagnosis not present

## 2023-12-01 ENCOUNTER — Encounter: Payer: Self-pay | Admitting: Oncology

## 2023-12-02 ENCOUNTER — Other Ambulatory Visit: Payer: Self-pay | Admitting: *Deleted

## 2023-12-02 MED ORDER — OXYCODONE HCL 10 MG PO TABS: 10.0000 mg | ORAL_TABLET | ORAL | 0 refills | Status: DC | PRN

## 2023-12-02 MED ORDER — MORPHINE SULFATE ER 30 MG PO TBCR: 30.0000 mg | EXTENDED_RELEASE_TABLET | Freq: Three times a day (TID) | ORAL | 0 refills | Status: DC

## 2023-12-05 ENCOUNTER — Encounter: Payer: Self-pay | Admitting: Oncology

## 2023-12-05 ENCOUNTER — Other Ambulatory Visit: Payer: Self-pay

## 2023-12-05 ENCOUNTER — Inpatient Hospital Stay: Payer: PPO | Attending: Oncology

## 2023-12-05 ENCOUNTER — Inpatient Hospital Stay (HOSPITAL_BASED_OUTPATIENT_CLINIC_OR_DEPARTMENT_OTHER): Payer: PPO | Admitting: Oncology

## 2023-12-05 VITALS — BP 133/80 | HR 58 | Temp 98.4°F | Resp 16 | Ht 67.0 in | Wt 169.7 lb

## 2023-12-05 DIAGNOSIS — Z79899 Other long term (current) drug therapy: Secondary | ICD-10-CM | POA: Insufficient documentation

## 2023-12-05 DIAGNOSIS — C9000 Multiple myeloma not having achieved remission: Secondary | ICD-10-CM | POA: Diagnosis not present

## 2023-12-05 DIAGNOSIS — N289 Disorder of kidney and ureter, unspecified: Secondary | ICD-10-CM | POA: Diagnosis not present

## 2023-12-05 DIAGNOSIS — R52 Pain, unspecified: Secondary | ICD-10-CM | POA: Insufficient documentation

## 2023-12-05 DIAGNOSIS — D696 Thrombocytopenia, unspecified: Secondary | ICD-10-CM | POA: Diagnosis not present

## 2023-12-05 LAB — CBC WITH DIFFERENTIAL (CANCER CENTER ONLY)
Abs Immature Granulocytes: 0.02 10*3/uL (ref 0.00–0.07)
Basophils Absolute: 0 10*3/uL (ref 0.0–0.1)
Basophils Relative: 1 %
Eosinophils Absolute: 0.2 10*3/uL (ref 0.0–0.5)
Eosinophils Relative: 4 %
HCT: 41.5 % (ref 39.0–52.0)
Hemoglobin: 13.1 g/dL (ref 13.0–17.0)
Immature Granulocytes: 1 %
Lymphocytes Relative: 23 %
Lymphs Abs: 1 10*3/uL (ref 0.7–4.0)
MCH: 33 pg (ref 26.0–34.0)
MCHC: 31.6 g/dL (ref 30.0–36.0)
MCV: 104.5 fL — ABNORMAL HIGH (ref 80.0–100.0)
Monocytes Absolute: 0.6 10*3/uL (ref 0.1–1.0)
Monocytes Relative: 13 %
Neutro Abs: 2.5 10*3/uL (ref 1.7–7.7)
Neutrophils Relative %: 58 %
Platelet Count: 71 10*3/uL — ABNORMAL LOW (ref 150–400)
RBC: 3.97 MIL/uL — ABNORMAL LOW (ref 4.22–5.81)
RDW: 15.7 % — ABNORMAL HIGH (ref 11.5–15.5)
WBC Count: 4.2 10*3/uL (ref 4.0–10.5)
nRBC: 0 % (ref 0.0–0.2)

## 2023-12-05 LAB — CMP (CANCER CENTER ONLY)
ALT: 11 U/L (ref 0–44)
AST: 17 U/L (ref 15–41)
Albumin: 3.8 g/dL (ref 3.5–5.0)
Alkaline Phosphatase: 87 U/L (ref 38–126)
Anion gap: 11 (ref 5–15)
BUN: 26 mg/dL — ABNORMAL HIGH (ref 8–23)
CO2: 25 mmol/L (ref 22–32)
Calcium: 8.7 mg/dL — ABNORMAL LOW (ref 8.9–10.3)
Chloride: 98 mmol/L (ref 98–111)
Creatinine: 1.33 mg/dL — ABNORMAL HIGH (ref 0.61–1.24)
GFR, Estimated: 51 mL/min — ABNORMAL LOW (ref >60)
Glucose, Bld: 106 mg/dL — ABNORMAL HIGH (ref 70–99)
Potassium: 3.8 mmol/L (ref 3.5–5.1)
Sodium: 134 mmol/L — ABNORMAL LOW (ref 135–145)
Total Bilirubin: 0.6 mg/dL (ref 0.0–1.2)
Total Protein: 6 g/dL — ABNORMAL LOW (ref 6.5–8.1)

## 2023-12-05 MED ORDER — MORPHINE SULFATE ER 30 MG PO TBCR
30.0000 mg | EXTENDED_RELEASE_TABLET | Freq: Three times a day (TID) | ORAL | 0 refills | Status: DC
  Filled 2023-12-05: qty 40, 14d supply, fill #0
  Filled 2023-12-05: qty 50, 16d supply, fill #0

## 2023-12-05 NOTE — Progress Notes (Signed)
 [facility]  Telephone:(336) [phone] Fax:(336) [phone]  ID: Jack [name] OB: [birth year]  MR#: [id number]  VHQ#:[id number]  Patient Care Team: Dorothey Baseman, MD as PCP - General (Family Medicine) End, Cristal Deer, MD as PCP - Cardiology (Cardiology) Quentin Cornwall, MD (Endocrinology) Jeralyn Ruths, MD as Consulting Physician (Hematology and Oncology) Rodney Langton, RN as [facility] Care Management   CHIEF COMPLAINT: Stage II kappa chain myeloma.  INTERVAL HISTORY: Patient returns to clinic today for repeat laboratory work, further evaluation, and continuation of Revlimid.  He is tolerating treatment well without significant side effects.  His back pain has improved recently with injections from pain clinic. He has no neurologic complaints.  He denies any fevers.  He has a good appetite and denies weight loss.  He denies chest pain, shortness of breath, cough, or hemoptysis.  He denies any nausea, vomiting, constipation, or diarrhea.  He has no urinary complaints.  Patient offers no further specific complaints today.  REVIEW OF SYSTEMS:   Review of Systems  Constitutional: Negative.  Negative for fever and malaise/fatigue.  Respiratory: Negative.  Negative for cough, hemoptysis and shortness of breath.   Cardiovascular: Negative.  Negative for chest pain and leg swelling.  Gastrointestinal: Negative.  Negative for abdominal pain.  Genitourinary: Negative.  Negative for dysuria and flank pain.  Musculoskeletal:  Positive for back pain. Negative for falls and joint pain.  Skin: Negative.  Negative for rash.  Neurological: Negative.  Negative for dizziness, focal weakness, weakness and headaches.  Psychiatric/Behavioral: Negative.  The patient is not nervous/anxious.     As per HPI. Otherwise, a complete review of systems is negative.  PAST MEDICAL HISTORY: Past Medical History:  Diagnosis Date   Ascending aortic aneurysm (HCC)     a. 12/2020 Echo: Asc Ao 48mm, Ao root 40mm. b. 03/2022 Asc Ao 4.6 cm (4.5 cm in 2019)   CKD (chronic kidney disease), stage III (HCC)    Compression fracture of body of thoracic vertebra (HCC)    Diastolic dysfunction    a. 12/2020 Echo: EF 50-55%, no rwma, mild LVH, GrI DD, nl RV fxn, mild AI. Asc Ao 48mm, Ao root 40mm.   Elevated prostate specific antigen (PSA)    has been 7 for a year    GERD (gastroesophageal reflux disease)    History of colon polyps 2008   [facility],    History of kidney stones    Hyperlipidemia    Hypertension    Myeloma (HCC)    PAF (paroxysmal atrial fibrillation) (HCC)    a. 01/2021-->Eliquis (CHA2DS2VASc = 3-4).   Pain    Prostate hypertrophy    diagnosed at age 42 due to hematospermia    PAST SURGICAL HISTORY: Past Surgical History:  Procedure Laterality Date   CATARACT EXTRACTION W/PHACO Left 01/10/2018   Procedure: CATARACT EXTRACTION PHACO AND INTRAOCULAR LENS PLACEMENT (IOC);  Surgeon: Galen Manila, MD;  Location: [facility] ORS;  Service: Ophthalmology;  Laterality: Left;  [name] 00:24.8 AP% 14.9 CDE 3.68 Fluid pack lot # [id number]   CATARACT EXTRACTION W/PHACO Right 01/25/2018   Procedure: CATARACT EXTRACTION PHACO AND INTRAOCULAR LENS PLACEMENT (IOC);  Surgeon: Galen Manila, MD;  Location: [facility] ORS;  Service: Ophthalmology;  Laterality: Right;  [name] 00:42 AP% 10.8 CDE 4.59 Fluid pack lot # [id number]   COLON SURGERY     CYSTOSCOPY W/ URETERAL STENT PLACEMENT Right 10/16/2017   Procedure: right  URETERAL STENT PLACEMENT,cystoscopy bilateral stent removal,rretrograde;  Surgeon: Riki Altes, MD;  Location:  [facility] ORS;  Service: Urology;  Laterality: Right;   CYSTOSCOPY/URETEROSCOPY/HOLMIUM LASER/STENT PLACEMENT Right 12/16/2020   Procedure: CYSTOSCOPY/URETEROSCOPY/HOLMIUM LASER/STENT PLACEMENT;  Surgeon: Riki Altes, MD;  Location: [facility] ORS;  Service: Urology;  Laterality: Right;   EXTRACORPOREAL SHOCK WAVE LITHOTRIPSY Right 12/11/2020    Procedure: EXTRACORPOREAL SHOCK WAVE LITHOTRIPSY (ESWL);  Surgeon: Riki Altes, MD;  Location: [facility] ORS;  Service: Urology;  Laterality: Right;   IR BONE TUMOR(S)RF ABLATION  04/16/2022   IR KYPHO EA ADDL LEVEL THORACIC OR LUMBAR  02/02/2021   IR KYPHO LUMBAR INC FX REDUCE BONE BX UNI/BIL CANNULATION INC/IMAGING  02/02/2021   IR KYPHO THORACIC WITH BONE BIOPSY  04/15/2022   KIDNEY STONE SURGERY     KYPHOPLASTY N/A 03/12/2021   Procedure: Nicki Reaper, L3;  Surgeon: Kennedy Bucker, MD;  Location: [facility] ORS;  Service: Orthopedics;  Laterality: N/A;   RESECTION SOFT TISSUE TUMOR LEG / ANKLE RADICAL  jan 2009   [facility],  right thigh/knee , nonmalignant   SMALL INTESTINE SURGERY  1946   implaed on picket fence, punctured stomach   TONSILLECTOMY      FAMILY HISTORY: Family History  Problem Relation Age of Onset   Hypertension Father    Hyperlipidemia Father    Cancer Sister        thyroid - dx in late 20's    ADVANCED DIRECTIVES (Y/N):  N  HEALTH MAINTENANCE: Social History   Tobacco Use   Smoking status: Former    Current packs/day: 0.00    Types: Cigarettes    Quit date: 07/05/1965    Years since quitting: 58.4   Smokeless tobacco: Current   Tobacco comments:    Occasional cigar  Vaping Use   Vaping status: Never Used  Substance Use Topics   Alcohol use: Not Currently    Alcohol/week: 1.0 standard drink of alcohol    Types: 1 Standard drinks or equivalent per week    Comment: occassionaly   Drug use: No     Colonoscopy:  PAP:  Bone density:  Lipid panel:  Allergies  Allergen Reactions   Quinolones     Aortic dilation contraindicates FQ   Amlodipine Swelling and Other (See Comments)    LE edema   Azithromycin Nausea And Vomiting   Lipitor [Atorvastatin]     Muscle pain in RT Leg    Lisinopril Cough   Zetia [Ezetimibe]     Pain in legs    Hydromorphone Hcl     Wife states "Medication makes extremely sedated and lethargic"    Current Outpatient Medications   Medication Sig Dispense Refill   Alpha-Lipoic Acid 600 MG CAPS Take by mouth.     Ascorbic Acid (VITAMIN C) 1000 MG tablet Take 1,000 mg by mouth in the morning and at bedtime.     Calcium Carb-Cholecalciferol (OYSTER SHELL CALCIUM W/D) 500-5 MG-MCG TABS Take 1 tablet by mouth in the morning and at bedtime.     carvedilol (COREG) 12.5 MG tablet Take 0.5 tablets (6.25 mg total) by mouth 2 (two) times daily. 180 tablet 3   cyanocobalamin (VITAMIN B12) 1000 MCG tablet Take 1,000 mcg by mouth daily.     dexamethasone (DECADRON) 4 MG tablet Take 5 tablets (20 mg total) by mouth once a week. 60 tablet 2   ELIQUIS 5 MG TABS tablet TAKE 1 TABLET BY MOUTH TWICE A DAY 180 tablet 0   gabapentin (NEURONTIN) 300 MG capsule TAKE 2 CAPSULES BY MOUTH 3 TIMES A DAY 180 capsule 2  ibuprofen (ADVIL) 200 MG tablet Take 200 mg by mouth 2 (two) times daily as needed.     lenalidomide (REVLIMID) 15 MG capsule Take 1 capsule (15 mg total) by mouth daily. Take for 21 days, then hold for 7 days. Repeat every 28 days. 21 capsule 0   lidocaine (XYLOCAINE) 5 % ointment APPLY TOPICALLY 3 (THREE) TIMES DAILY AS NEEDED FOR MILD PAIN OR MODERATE PAIN. 50 g 2   Multiple Vitamin (MULTIVITAMIN WITH MINERALS) TABS tablet Take 1 tablet by mouth daily.     Multiple Vitamins-Minerals (PRESERVISION AREDS PO) Take 1 capsule by mouth in the morning and at bedtime.     naloxone (NARCAN) nasal spray 4 mg/0.1 mL SPRAY 1 SPRAY INTO ONE NOSTRIL AS DIRECTED FOR OPIOID OVERDOSE (TURN PERSON ON SIDE AFTER DOSE. IF NO RESPONSE IN 2-3 MINUTES OR PERSON RESPONDS BUT RELAPSES, REPEAT USING A NEW SPRAY DEVICE AND SPRAY INTO THE OTHER NOSTRIL. CALL 911 AFTER USE.) * EMERGENCY USE ONLY * 1 each 0   omeprazole (PRILOSEC) 20 MG capsule TAKE 1 CAPSULE BY MOUTH EVERY DAY 90 capsule 2   Oxycodone HCl 10 MG TABS Take 1 tablet (10 mg total) by mouth every 4 (four) hours as needed (pain). 90 tablet 0   polyethylene glycol (MIRALAX / GLYCOLAX) 17 g packet Take  17 g by mouth 2 (two) times daily.  0   potassium citrate (UROCIT-K) 10 MEQ (1080 MG) SR tablet TAKE 1 TABLET (10 MEQ TOTAL) BY MOUTH 3 (THREE) TIMES DAILY WITH MEALS. 300 tablet 2   senna-docusate (SENOKOT-S) 8.6-50 MG tablet Take 1 tablet by mouth 2 (two) times daily. 30 tablet 0   morphine (MS CONTIN) 30 MG 12 hr tablet Take 1 tablet (30 mg total) by mouth every 8 (eight) hours. 90 tablet 0   No current facility-administered medications for this visit.    OBJECTIVE: Vitals:   12/05/23 1431  BP: 133/80  Pulse: (!) 58  Resp: 16  Temp: 98.4 F (36.9 C)  SpO2: 97%       Body mass index is 26.58 kg/m.    ECOG FS:1 - Symptomatic but completely ambulatory  General: Well-developed, well-nourished, no acute distress. Eyes: Pink conjunctiva, anicteric sclera. HEENT: Normocephalic, moist mucous membranes. Lungs: No audible wheezing or coughing. Heart: Regular rate and rhythm. Abdomen: Soft, nontender, no obvious distention. Musculoskeletal: No edema, cyanosis, or clubbing. Neuro: Alert, answering all questions appropriately. Cranial nerves grossly intact. Skin: No rashes or petechiae noted. Psych: Normal affect.  LAB RESULTS:  Lab Results  Component Value Date   NA 134 (L) 12/05/2023   K 3.8 12/05/2023   CL 98 12/05/2023   CO2 25 12/05/2023   GLUCOSE 106 (H) 12/05/2023   BUN 26 (H) 12/05/2023   CREATININE 1.33 (H) 12/05/2023   CALCIUM 8.7 (L) 12/05/2023   PROT 6.0 (L) 12/05/2023   ALBUMIN 3.8 12/05/2023   AST 17 12/05/2023   ALT 11 12/05/2023   ALKPHOS 87 12/05/2023   BILITOT 0.6 12/05/2023   GFRNONAA 51 (L) 12/05/2023   GFRAA 29 (L) 10/16/2017    Lab Results  Component Value Date   WBC 4.2 12/05/2023   NEUTROABS 2.5 12/05/2023   HGB 13.1 12/05/2023   HCT 41.5 12/05/2023   MCV 104.5 (H) 12/05/2023   PLT 71 (L) 12/05/2023     STUDIES: DG PAIN CLINIC C-ARM 1-60 MIN NO REPORT Result Date: 11/22/2023 Fluoro was used, but no Radiologist interpretation will be  provided. Please refer to "NOTES" tab for provider progress  note.   ASSESSMENT: Stage II Kappa chain myeloma.  PLAN:    Stage II kappa chain myeloma: (11:14 translocation, high risk) SPEP essentially negative and immunoglobulins are within normal limits.  Patient's kappa light chains initially improved from greater than 5400 down to 39.1.  PET scan results from December 08, 2021 reviewed independently with no obvious evidence of progressive disease.  Nuclear med bone scan results from December 28, 2022 did not reveal any concerning pathology.  Patient was off Revlimid for approximately 4 months given admission for sepsis and abscess and extended antibiotic treatment.  His kappa chains increased, but are now trending back down his most recent result was 140.8.  Rivka Barbara has been discontinued permanently secondary to dental issues.  No further intervention is needed.  Return to clinic in 4 weeks for laboratory work and evaluation by clinical pharmacy.  Patient to then return to clinic in 8 weeks for further evaluation and continuation of treatment. Anemia: Resolved. Leukopenia: Resolved. Thrombocytopenia: Chronic and changed.  Patient's most recent platelet count is 71.   Bone lesions: Nuclear med bone scan as above with no obvious evidence of progressive disease.  Rivka Barbara has been discontinued. Pain: Well-controlled.  Continue follow-up with pain clinic as indicated.  Nuclear medicine bone scan completed on December 28, 2022 did not report any active areas of myeloma.  Patient only takes long-acting morphine 30 mg every 8 hours.  He also takes Tylenol and ibuprofen sparingly.  Previously he was instructed that if his platelets fall below 50 this would need to be discontinued.  Radiation oncology has determined no further treatments are needed at this time.   Renal insufficiency: Mild.  Patient's most recent creatinine is 1.33.   Tooth abscess/sepsis: Resolved.  Patient is now off antibiotics.   Patient expressed  understanding and was in agreement with this plan. He also understands that He can call clinic at any time with any questions, concerns, or complaints.     Cancer Staging  Kappa light chain myeloma (HCC) Staging form: Plasma Cell Myeloma and Plasma Cell Disorders, AJCC 8th Edition - Clinical stage from 04/16/2021: Albumin (g/dL): 3.8, ISS: Stage II, High-risk cytogenetics: Absent, LDH: Unknown - Signed by Jeralyn Ruths, MD on 04/16/2021 Albumin range (g/dL): Greater than or equal to 3.5 Cytogenetics: t(11;14) translocation Serum calcium level: Normal Serum creatinine level: Normal Bone disease on imaging: Present  Jeralyn Ruths, MD   12/05/2023 5:39 PM

## 2023-12-05 NOTE — Progress Notes (Signed)
 Received a shot for his back over a week ago that has helped with pain. Wife wanted to make mention.

## 2023-12-06 LAB — KAPPA/LAMBDA LIGHT CHAINS
Kappa free light chain: 149.6 mg/L — ABNORMAL HIGH (ref 3.3–19.4)
Kappa, lambda light chain ratio: 8.7 — ABNORMAL HIGH (ref 0.26–1.65)
Lambda free light chains: 17.2 mg/L (ref 5.7–26.3)

## 2023-12-12 ENCOUNTER — Other Ambulatory Visit: Payer: Self-pay | Admitting: Oncology

## 2023-12-12 DIAGNOSIS — C9000 Multiple myeloma not having achieved remission: Secondary | ICD-10-CM

## 2023-12-12 NOTE — Progress Notes (Unsigned)
 PROVIDER NOTE: Information contained herein reflects review and annotations entered in association with encounter. Interpretation of such information and data should be left to medically-trained personnel. Information provided to patient can be located elsewhere in the medical record under "Patient Instructions". Document created using STT-dictation technology, any transcriptional errors that may result from process are unintentional.    Patient: Jack [name]  Service Category: E/M  Provider: Oswaldo Done, MD  DOB: 1932-11-17  DOS: 12/13/2023  Referring Provider: Dorothey Baseman, MD  MRN: [id number]  Specialty: Interventional Pain Management  PCP: Dorothey Baseman, MD  Type: Established Patient  Setting: Ambulatory outpatient    Location: Office  Delivery: Face-to-face     HPI  Jack [name], a 89 y.o. year old male, is here today because of his Multilevel spine pain [M54.9]. Mr. Lech primary complain today is No chief complaint on file.  Pertinent problems: Mr. Kunzler has Compression fracture of T12 vertebra, sequela; Compression fracture of L1 lumbar vertebra, sequela; Numbness of foot; Sciatica; Sensory polyneuropathy; Shoulder pain; Multilevel spine pain; Compression fracture of L2 lumbar vertebra, sequela; Kappa light chain myeloma (HCC); Right flank pain; Compression fracture of thoracic vertebra, sequela; Multiple myeloma (HCC); Pelvic fracture (HCC); Collapsed vertebra, not elsewhere classified, thoracic region, subsequent encounter for fracture with routine healing; Muscle weakness (generalized); Statin myopathy; Unsteadiness on feet; Chronic pain syndrome; History of vertebral fracture; Compression fracture of L3 lumbar vertebra, sequela; Compression fracture of T11 vertebra, sequela; Pathologic lumbar vertebral fracture, sequela; Pathologic fracture of thoracic vertebrae, sequela; History of multiple myeloma; Chronic low back pain (1ry area of Pain) (Bilateral) (R>L) w/o  sciatica; History of kyphoplasty (T11, T12, L1, L2, and L3); Grade 1 (3mm) Anterolisthesis of cervical spine (C4/C5, C5/C6, C6/C7); Cancer related pain; Compression fracture of lumbar vertebra, sequela; Multiple pathological fractures, sequela; Thoracic spine pain; Lumbar spine pain; Abnormal MRI, lumbar spine (11/16/2023); and Abnormal MRI, thoracic spine (11/16/2023) on their pertinent problem list. Pain Assessment: Severity of   is reported as a  /10. Location:    / . Onset:  . Quality:  . Timing:  . Modifying factor(s):  [name] Vitals:  vitals were not taken for this visit.  BMI: Estimated body mass index is 26.58 kg/m as calculated from the following:   Height as of 12/05/23: 5\' 7"  (1.702 m).   Weight as of 12/05/23: 169 lb 11.2 oz (77 kg). Last encounter: 11/16/2023. Last procedure: 11/22/2023.  Reason for encounter: post-procedure evaluation and assessment. ***  Discussed the use of AI scribe software for clinical note transcription with the patient, who gave verbal consent to proceed.  History of Present Illness          Post-procedure evaluation   Type: Lumbar epidural steroid injection (LESI) (interlaminar) #1    Laterality: Midline   Level:  T12-L1 Level.  Imaging: Fluoroscopic guidance Spinal ([id number]) Anesthesia: Local anesthesia (1-2% Lidocaine) Anxiolysis: IV Versed 1.5 mg Sedation: Moderate Sedation None required. No Fentanyl administered.         DOS: 11/22/2023  Performed by: Oswaldo Done, MD  Purpose: Diagnostic/Therapeutic Indications: Lumbar radicular pain of intraspinal etiology of more than 4 weeks that has failed to respond to conservative therapy and is severe enough to impact quality of life or function. 1. Multilevel spine pain   2. Multiple myeloma, remission status unspecified (HCC)   3. Multiple pathological fractures, sequela   4. Compression fracture of T11 vertebra, sequela   5. Compression fracture of T12 vertebra, sequela   6. Compression fracture  of L1 lumbar vertebra, sequela   7. Compression fracture of L2 lumbar vertebra, sequela   8. Compression fracture of L3 lumbar vertebra, sequela   9. Lumbar spine pain   10. Pathologic fracture of thoracic vertebrae, sequela   11. Pathologic lumbar vertebral fracture, sequela   12. Thoracic spine pain   13. Chronic anticoagulation (Eliquis)    NAS-11 Pain score:   Pre-procedure: 6 /10   Post-procedure: 0-No pain/10     Effectiveness:  Initial hour after procedure:   ***. Subsequent 4-6 hours post-procedure:   ***. Analgesia past initial 6 hours:   ***. Ongoing improvement:  Analgesic:  *** Function:    ***    ROM:    ***      Pharmacotherapy Assessment  Analgesic: No chronic opioid analgesics therapy prescribed by our practice. oxycodone IR 10 mg tablet, 2 tabs p.o. 3 times daily (# 90) (last filled on 03/11/2023) (90 MME); morphine ER 30 mg tablet, 1 tab p.o. 3 times daily (# 90) (last filled on 03/24/2023) (90 MME) by Jeralyn Ruths, MD (Oncology)  MME/day: 180 mg/day   Monitoring: [place] PMP: PDMP reviewed during this encounter.       Pharmacotherapy: No side-effects or adverse reactions reported. Compliance: No problems identified. Effectiveness: Clinically acceptable.  No notes on file  No results found for: "CBDTHCR" No results found for: "D8THCCBX" No results found for: "D9THCCBX"  UDS:  No results found for: "SUMMARY"    ROS  Constitutional: Denies any fever or chills Gastrointestinal: No reported hemesis, hematochezia, vomiting, or acute GI distress Musculoskeletal: Denies any acute onset joint swelling, redness, loss of ROM, or weakness Neurological: No reported episodes of acute onset apraxia, aphasia, dysarthria, agnosia, amnesia, paralysis, loss of coordination, or loss of consciousness  Medication Review  Alpha-Lipoic Acid, Multiple Vitamins-Minerals, Oxycodone HCl, Oyster Shell Calcium w/D, apixaban, carvedilol, cyanocobalamin, dexamethasone, gabapentin,  ibuprofen, lenalidomide, lidocaine, morphine, multivitamin with minerals, naloxone, omeprazole, polyethylene glycol, potassium citrate, senna-docusate, and vitamin C  History Review  Allergy: Mr. Cafarelli is allergic to quinolones, amlodipine, azithromycin, lipitor [atorvastatin], lisinopril, zetia [ezetimibe], and hydromorphone hcl. Drug: Mr. Carmen  reports no history of drug use. Alcohol:  reports that he does not currently use alcohol after a past usage of about 1.0 standard drink of alcohol per week. Tobacco:  reports that he quit smoking about 58 years ago. His smoking use included cigarettes. He uses smokeless tobacco. Social: Mr. Dillenbeck  reports that he quit smoking about 58 years ago. His smoking use included cigarettes. He uses smokeless tobacco. He reports that he does not currently use alcohol after a past usage of about 1.0 standard drink of alcohol per week. He reports that he does not use drugs. Medical:  has a past medical history of Ascending aortic aneurysm (HCC), CKD (chronic kidney disease), stage III (HCC), Compression fracture of body of thoracic vertebra (HCC), Diastolic dysfunction, Elevated prostate specific antigen (PSA), GERD (gastroesophageal reflux disease), History of colon polyps (2008), History of kidney stones, Hyperlipidemia, Hypertension, Myeloma (HCC), PAF (paroxysmal atrial fibrillation) (HCC), Pain, and Prostate hypertrophy. Surgical: Mr. Birky  has a past surgical history that includes Resection soft tissue tumor leg / ankle radical (jan 2009); Small intestine surgery (1946); Cystoscopy w/ ureteral stent placement (Right, 10/16/2017); Kidney stone surgery; Colon surgery; Tonsillectomy; Cataract extraction w/PHACO (Left, 01/10/2018); Cataract extraction w/PHACO (Right, 01/25/2018); Extracorporeal shock wave lithotripsy (Right, 12/11/2020); Cystoscopy/ureteroscopy/holmium laser/stent placement (Right, 12/16/2020); IR KYPHO LUMBAR INC FX REDUCE BONE BX UNI/BIL CANNULATION INC/IMAGING  (02/02/2021); IR KYPHO EA  ADDL LEVEL THORACIC OR LUMBAR (02/02/2021); Kyphoplasty (N/A, 03/12/2021); IR KYPHO THORACIC WITH BONE BIOPSY (04/15/2022); and IR Bone Tumor(s)RF Ablation (04/16/2022). Family: family history includes Cancer in his sister; Hyperlipidemia in his father; Hypertension in his father.  Laboratory Chemistry Profile   Renal Lab Results  Component Value Date   BUN 26 (H) 12/05/2023   CREATININE 1.33 (H) 12/05/2023   LABCREA 96.68 02/03/2021   BCR 12 02/24/2021   GFR 59.90 (L) 09/24/2013   GFRAA 29 (L) 10/16/2017   GFRNONAA 51 (L) 12/05/2023    Hepatic Lab Results  Component Value Date   AST 17 12/05/2023   ALT 11 12/05/2023   ALBUMIN 3.8 12/05/2023   ALKPHOS 87 12/05/2023   LIPASE 32 03/26/2021    Electrolytes Lab Results  Component Value Date   NA 134 (L) 12/05/2023   K 3.8 12/05/2023   CL 98 12/05/2023   CALCIUM 8.7 (L) 12/05/2023   MG 2.1 04/06/2023    Bone Lab Results  Component Value Date   VD25OH 28 (L) 08/23/2013    Inflammation (CRP: Acute Phase) (ESR: Chronic Phase) Lab Results  Component Value Date   CRP 1.5 (H) 06/21/2023   ESRSEDRATE 40 (H) 06/21/2023   LATICACIDVEN 1.8 04/03/2023         Note: Above Lab results reviewed.  Recent Imaging Review  DG PAIN CLINIC C-ARM 1-60 MIN NO REPORT Fluoro was used, but no Radiologist interpretation will be provided.  Please refer to "NOTES" tab for provider progress note. Note: Reviewed        Physical Exam  General appearance: Well nourished, well developed, and well hydrated. In no apparent acute distress Mental status: Alert, oriented x 3 (person, place, & time)       Respiratory: No evidence of acute respiratory distress Eyes: PERLA Vitals: There were no vitals taken for this visit. BMI: Estimated body mass index is 26.58 kg/m as calculated from the following:   Height as of 12/05/23: 5\' 7"  (1.702 m).   Weight as of 12/05/23: 169 lb 11.2 oz (77 kg). Ideal: Ideal body weight: 66.1 kg (145 lb  11.6 oz) Adjusted ideal body weight: 70.4 kg (155 lb 5 oz)  Assessment   Diagnosis Status  1. Multilevel spine pain   2. Multiple myeloma, remission status unspecified (HCC)   3. Multiple pathological fractures, sequela   4. Lumbar spine pain   5. Thoracic spine pain   6. Postop check    Controlled Controlled Controlled   Updated Problems: No problems updated.  Plan of Care  Problem-specific:  Assessment and Plan            Mr. JIMMY PLESSINGER has a current medication list which includes the following long-term medication(s): oyster shell calcium w/d, carvedilol, eliquis, gabapentin, and omeprazole.  Pharmacotherapy (Medications Ordered): No orders of the defined types were placed in this encounter.  Orders:  No orders of the defined types were placed in this encounter.  Follow-up plan:   No follow-ups on file.      Interventional Therapies  Risk Factors  Considerations  Comorbidities ELIQUIS Anticoagulation (Stop: 3 days  Restart: 6 hours)  Multiple Myeloma  polypharmacy  High Dose opioid Therapy  Stage3b CKD  ascending aortic aneurysm  diastolic dysfunction  GERD  A-fib  nephrolithiasis  HTN  MV insufficiency     Planned  Pending:   Diagnostic/therapeutic midline thoracolumbar LESI #1    Under consideration:   Diagnostic/therapeutic midline thoracolumbar LESI #1    Completed:  None at this time   Therapeutic  Palliative (PRN) options:   None established   Completed by other providers:   None reported      Recent Visits Date Type Provider Dept  11/22/23 Procedure visit Delano Metz, MD [facility]  11/16/23 Office Visit Delano Metz, MD [facility]  Showing recent visits within past 90 days and meeting all other requirements Future Appointments Date Type Provider Dept  12/13/23 Appointment Delano Metz, MD [facility]  Showing future appointments within next 90 days and meeting all  other requirements  I discussed the assessment and treatment plan with the patient. The patient was provided an opportunity to ask questions and all were answered. The patient agreed with the plan and demonstrated an understanding of the instructions.  Patient advised to call back or seek an in-person evaluation if the symptoms or condition worsens.  Duration of encounter: *** minutes.  Total time on encounter, as per AMA guidelines included both the face-to-face and non-face-to-face time personally spent by the physician and/or other qualified health care professional(s) on the day of the encounter (includes time in activities that require the physician or other qualified health care professional and does not include time in activities normally performed by clinical staff). Physician's time may include the following activities when performed: Preparing to see the patient (e.g., pre-charting review of records, searching for previously ordered imaging, lab work, and nerve conduction tests) Review of prior analgesic pharmacotherapies. Reviewing PMP Interpreting ordered tests (e.g., lab work, imaging, nerve conduction tests) Performing post-procedure evaluations, including interpretation of diagnostic procedures Obtaining and/or reviewing separately obtained history Performing a medically appropriate examination and/or evaluation Counseling and educating the patient/family/caregiver Ordering medications, tests, or procedures Referring and communicating with other health care professionals (when not separately reported) Documenting clinical information in the electronic or other health record Independently interpreting results (not separately reported) and communicating results to the patient/ family/caregiver Care coordination (not separately reported)  Note by: Oswaldo Done, MD Date: 12/13/2023; Time: 4:41 AM

## 2023-12-13 ENCOUNTER — Encounter: Payer: Self-pay | Admitting: Pain Medicine

## 2023-12-13 ENCOUNTER — Ambulatory Visit: Attending: Pain Medicine | Admitting: Pain Medicine

## 2023-12-13 VITALS — BP 168/48 | HR 54 | Temp 97.0°F | Resp 16 | Ht 67.0 in | Wt 170.0 lb

## 2023-12-13 DIAGNOSIS — M5459 Other low back pain: Secondary | ICD-10-CM | POA: Insufficient documentation

## 2023-12-13 DIAGNOSIS — M47817 Spondylosis without myelopathy or radiculopathy, lumbosacral region: Secondary | ICD-10-CM | POA: Diagnosis not present

## 2023-12-13 DIAGNOSIS — M549 Dorsalgia, unspecified: Secondary | ICD-10-CM | POA: Diagnosis not present

## 2023-12-13 DIAGNOSIS — Z09 Encounter for follow-up examination after completed treatment for conditions other than malignant neoplasm: Secondary | ICD-10-CM | POA: Diagnosis not present

## 2023-12-13 DIAGNOSIS — C9000 Multiple myeloma not having achieved remission: Secondary | ICD-10-CM | POA: Diagnosis not present

## 2023-12-13 DIAGNOSIS — Z7901 Long term (current) use of anticoagulants: Secondary | ICD-10-CM | POA: Insufficient documentation

## 2023-12-13 DIAGNOSIS — M546 Pain in thoracic spine: Secondary | ICD-10-CM | POA: Diagnosis not present

## 2023-12-13 DIAGNOSIS — M545 Low back pain, unspecified: Secondary | ICD-10-CM | POA: Diagnosis not present

## 2023-12-13 DIAGNOSIS — M47816 Spondylosis without myelopathy or radiculopathy, lumbar region: Secondary | ICD-10-CM | POA: Diagnosis not present

## 2023-12-13 DIAGNOSIS — M8440XS Pathological fracture, unspecified site, sequela: Secondary | ICD-10-CM | POA: Diagnosis not present

## 2023-12-13 NOTE — Patient Instructions (Signed)
 ______________________________________________________________________    Procedure instructions  Stop blood-thinners  Do not eat or drink fluids (other than water) for 6 hours before your procedure  No water for 2 hours before your procedure  Take your blood pressure medicine with a sip of water  Arrive 30 minutes before your appointment  If sedation is planned, bring suitable driver. Pennie Banter, Benedetto Goad, & public transportation are NOT APPROVED)  Carefully read the "Preparing for your procedure" detailed instructions  If you have questions call us at [phone]  Procedure appointments are for procedures only. NO medication refills or new problem evaluations.   ______________________________________________________________________      ______________________________________________________________________    Preparing for your procedure  Appointments: If you think you may not be able to keep your appointment, call 24-48 hours in advance to cancel. We need time to make it available to others.  Procedure visits are for procedures only. During your procedure appointment there will be: NO Prescription Refills*. NO medication changes or discussions*. NO discussion of disability issues*. NO unrelated pain problem evaluations*. NO evaluations to order other pain procedures*. *These will be addressed at a separate and distinct evaluation encounter on the provider's evaluation schedule and not during procedure days.  Instructions: Food intake: Avoid eating anything solid for at least 8 hours prior to your procedure. Clear liquid intake: You may take clear liquids such as water up to 2 hours prior to your procedure. (No carbonated drinks. No soda.) Transportation: Unless otherwise stated by your physician, bring a driver. (Driver cannot be a Market researcher, Pharmacist, community, or any other form of public transportation.) Morning Medicines: Except for blood thinners, take all of your other morning  medications with a sip of water. Make sure to take your heart and blood pressure medicines. If your blood pressure's lower number is above 100, the case will be rescheduled. Blood thinners: Make sure to stop your blood thinners as instructed.  If you take a blood thinner, but were not instructed to stop it, call our office [phone] and ask to talk to a nurse. Not stopping a blood thinner prior to certain procedures could lead to serious complications. Diabetics on insulin: Notify the staff so that you can be scheduled 1st case in the morning. If your diabetes requires high dose insulin, take only  of your normal insulin dose the morning of the procedure and notify the staff that you have done so. Preventing infections: Shower with an antibacterial soap the morning of your procedure.  Build-up your immune system: Take 1000 mg of Vitamin C with every meal (3 times a day) the day prior to your procedure. Antibiotics: Inform the nursing staff if you are taking any antibiotics or if you have any conditions that may require antibiotics prior to procedures. (Example: recent joint implants)   Pregnancy: If you are pregnant make sure to notify the nursing staff. Not doing so may result in injury to the fetus, including death.  Sickness: If you have a cold, fever, or any active infections, call and cancel or reschedule your procedure. Receiving steroids while having an infection may result in complications. Arrival: You must be in the facility at least 30 minutes prior to your scheduled procedure. Tardiness: Your scheduled time is also the cutoff time. If you do not arrive at least 15 minutes prior to your procedure, you will be rescheduled.  Children: Do not bring any children with you. Make arrangements to keep them home. Dress appropriately: There is always a possibility that your clothing may get  soiled. Avoid long dresses. Valuables: Do not bring any jewelry or valuables.  Reasons to call and  reschedule or cancel your procedure: (Following these recommendations will minimize the risk of a serious complication.) Surgeries: Avoid having procedures within 2 weeks of any surgery. (Avoid for 2 weeks before or after any surgery). Flu Shots: Avoid having procedures within 2 weeks of a flu shots or . (Avoid for 2 weeks before or after immunizations). Barium: Avoid having a procedure within 7-10 days after having had a radiological study involving the use of radiological contrast. (Myelograms, Barium swallow or enema study). Heart attacks: Avoid any elective procedures or surgeries for the initial 6 months after a "Myocardial Infarction" (Heart Attack). Blood thinners: It is imperative that you stop these medications before procedures. Let us know if you if you take any blood thinner.  Infection: Avoid procedures during or within two weeks of an infection (including chest colds or gastrointestinal problems). Symptoms associated with infections include: Localized redness, fever, chills, night sweats or profuse sweating, burning sensation when voiding, cough, congestion, stuffiness, runny nose, sore throat, diarrhea, nausea, vomiting, cold or Flu symptoms, recent or current infections. It is specially important if the infection is over the area that we intend to treat. Heart and lung problems: Symptoms that may suggest an active cardiopulmonary problem include: cough, chest pain, breathing difficulties or shortness of breath, dizziness, ankle swelling, uncontrolled high or unusually low blood pressure, and/or palpitations. If you are experiencing any of these symptoms, cancel your procedure and contact your primary care physician for an evaluation.  Remember:  Regular Business hours are:  Monday to Thursday 8:00 AM to 4:00 PM  Provider's Schedule: Delano Metz, MD:  Procedure days: Tuesday and Thursday 7:30 AM to 4:00 PM  Edward Jolly, MD:  Procedure days: Monday and Wednesday 7:30 AM to 4:00  PM Last  Updated: 08/23/2023 ______________________________________________________________________      ______________________________________________________________________    General Risks and Possible Complications  Patient Responsibilities: It is important that you read this as it is part of your informed consent. It is our duty to inform you of the risks and possible complications associated with treatments offered to you. It is your responsibility as a patient to read this and to ask questions about anything that is not clear or that you believe was not covered in this document.  Patient's Rights: You have the right to refuse treatment. You also have the right to change your mind, even after initially having agreed to have the treatment done. However, under this last option, if you wait until the last second to change your mind, you may be charged for the materials used up to that point.  Introduction: Medicine is not an Visual merchandiser. Everything in Medicine, including the lack of treatment(s), carries the potential for danger, harm, or loss (which is by definition: Risk). In Medicine, a complication is a secondary problem, condition, or disease that can aggravate an already existing one. All treatments carry the risk of possible complications. The fact that a side effects or complications occurs, does not imply that the treatment was conducted incorrectly. It must be clearly understood that these can happen even when everything is done following the highest safety standards.  No treatment: You can choose not to proceed with the proposed treatment alternative. The "PRO(s)" would include: avoiding the risk of complications associated with the therapy. The "CON(s)" would include: not getting any of the treatment benefits. These benefits fall under one of three categories: diagnostic; therapeutic; and/or  palliative. Diagnostic benefits include: getting information which can ultimately lead to  improvement of the disease or symptom(s). Therapeutic benefits are those associated with the successful treatment of the disease. Finally, palliative benefits are those related to the decrease of the primary symptoms, without necessarily curing the condition (example: decreasing the pain from a flare-up of a chronic condition, such as incurable terminal cancer).  General Risks and Complications: These are associated to most interventional treatments. They can occur alone, or in combination. They fall under one of the following six (6) categories: no benefit or worsening of symptoms; bleeding; infection; nerve damage; allergic reactions; and/or death. No benefits or worsening of symptoms: In Medicine there are no guarantees, only probabilities. No healthcare provider can ever guarantee that a medical treatment will work, they can only state the probability that it may. Furthermore, there is always the possibility that the condition may worsen, either directly, or indirectly, as a consequence of the treatment. Bleeding: This is more common if the patient is taking a blood thinner, either prescription or over the counter (example: Goody Powders, Fish oil, Aspirin, Garlic, etc.), or if suffering a condition associated with impaired coagulation (example: Hemophilia, cirrhosis of the liver, low platelet counts, etc.). However, even if you do not have one on these, it can still happen. If you have any of these conditions, or take one of these drugs, make sure to notify your treating physician. Infection: This is more common in patients with a compromised immune system, either due to disease (example: diabetes, cancer, human immunodeficiency virus [HIV], etc.), or due to medications or treatments (example: therapies used to treat cancer and rheumatological diseases). However, even if you do not have one on these, it can still happen. If you have any of these conditions, or take one of these drugs, make sure to notify  your treating physician. Nerve Damage: This is more common when the treatment is an invasive one, but it can also happen with the use of medications, such as those used in the treatment of cancer. The damage can occur to small secondary nerves, or to large primary ones, such as those in the spinal cord and brain. This damage may be temporary or permanent and it may lead to impairments that can range from temporary numbness to permanent paralysis and/or brain death. Allergic Reactions: Any time a substance or material comes in contact with our body, there is the possibility of an allergic reaction. These can range from a mild skin rash (contact dermatitis) to a severe systemic reaction (anaphylactic reaction), which can result in death. Death: In general, any medical intervention can result in death, most of the time due to an unforeseen complication. ______________________________________________________________________      ______________________________________________________________________    Blood Thinners  IMPORTANT NOTICE:  If you take any of these, make sure to notify the nursing staff.  Failure to do so may result in serious injury.  Recommended time intervals to stop and restart blood-thinners, before & after invasive procedures  Generic Name Brand Name Pre-procedure: Stop medication for this amount of time before your procedure: Post-procedure: Wait this amount of time after the procedure before restarting your medication:  Abciximab Reopro 15 days 2 hrs  Alteplase Activase 10 days 10 days  Anagrelide Agrylin    Apixaban Eliquis 3 days 6 hrs  Cilostazol Pletal 3 days 5 hrs  Clopidogrel Plavix 7-10 days 2 hrs  Dabigatran Pradaxa 5 days 6 hrs  Dalteparin Fragmin 24 hours 4 hrs  Dipyridamole Aggrenox 11days 2  hrs  Edoxaban [place] 3 days 2 hrs  Enoxaparin  Lovenox 24 hours 4 hrs  Eptifibatide Integrillin 8 hours 2 hrs  Fondaparinux  Arixtra 72 hours 12 hrs   Hydroxychloroquine Plaquenil 11 days   Prasugrel Effient 7-10 days 6 hrs  Reteplase Retavase 10 days 10 days  Rivaroxaban Xarelto 3 days 6 hrs  Ticagrelor Brilinta 5-7 days 6 hrs  Ticlopidine Ticlid 10-14 days 2 hrs  Tinzaparin Innohep 24 hours 4 hrs  Tirofiban Aggrastat 8 hours 2 hrs  Warfarin Coumadin 5 days 2 hrs   Other medications with blood-thinning effects  NOTE: Consider stopping these if you have prolonged bleeding despite not taking any of the above blood thinners. Otherwise ask your provider and this will be decided on a case-by-case basis.  Product indications Generic (Brand) names Note  Cholesterol Lipitor Stop 4 days before procedure  Blood thinner (injectable) Heparin (LMW or LMWH Heparin) Stop 24 hours before procedure  Cancer Ibrutinib (Imbruvica) Stop 7 days before procedure  Malaria/Rheumatoid Hydroxychloroquine (Plaquenil) Stop 11 days before procedure  Thrombolytics  10 days before or after procedures   Over-the-counter (OTC) Products with blood-thinning effects  NOTE: Consider stopping these if you have prolonged bleeding despite not taking any of the above blood thinners. Otherwise ask your provider and this will be decided on a case-by-case basis.  Product Common names Stop Time  Aspirin > 325 mg Goody Powders, Excedrin, etc. 11 days  Aspirin <= 81 mg  7 days  Fish oil  4 days  Garlic supplements  7 days  Ginkgo biloba  36 hours  Ginseng  24 hours  NSAIDs Ibuprofen, Naprosyn, etc. 3 days  Vitamin E  4 days   ______________________________________________________________________

## 2023-12-13 NOTE — Progress Notes (Signed)
 Safety precautions to be maintained throughout the outpatient stay will include: orient to surroundings, keep bed in low position, maintain call bell within reach at all times, provide assistance with transfer out of bed and ambulation.

## 2023-12-17 ENCOUNTER — Encounter: Payer: Self-pay | Admitting: Oncology

## 2023-12-19 ENCOUNTER — Other Ambulatory Visit: Payer: Self-pay | Admitting: *Deleted

## 2023-12-19 DIAGNOSIS — C9000 Multiple myeloma not having achieved remission: Secondary | ICD-10-CM

## 2023-12-19 MED ORDER — GABAPENTIN 300 MG PO CAPS: 600.0000 mg | ORAL_CAPSULE | Freq: Three times a day (TID) | ORAL | 2 refills | Status: DC

## 2023-12-22 ENCOUNTER — Ambulatory Visit: Attending: Pain Medicine | Admitting: Pain Medicine

## 2023-12-22 ENCOUNTER — Encounter: Payer: Self-pay | Admitting: Pain Medicine

## 2023-12-22 ENCOUNTER — Ambulatory Visit
Admission: RE | Admit: 2023-12-22 | Discharge: 2023-12-22 | Disposition: A | Discharge status: Home / Self Care | Source: Ambulatory Visit | Attending: Pain Medicine | Admitting: Pain Medicine

## 2023-12-22 VITALS — BP 107/67 | HR 44 | Temp 97.3°F | Resp 16 | Ht 67.0 in | Wt 170.0 lb

## 2023-12-22 DIAGNOSIS — M545 Low back pain, unspecified: Secondary | ICD-10-CM | POA: Insufficient documentation

## 2023-12-22 DIAGNOSIS — S32020S Wedge compression fracture of second lumbar vertebra, sequela: Secondary | ICD-10-CM | POA: Insufficient documentation

## 2023-12-22 DIAGNOSIS — Z9889 Other specified postprocedural states: Secondary | ICD-10-CM | POA: Insufficient documentation

## 2023-12-22 DIAGNOSIS — S32010S Wedge compression fracture of first lumbar vertebra, sequela: Secondary | ICD-10-CM | POA: Insufficient documentation

## 2023-12-22 DIAGNOSIS — M549 Dorsalgia, unspecified: Secondary | ICD-10-CM | POA: Diagnosis not present

## 2023-12-22 DIAGNOSIS — S32000S Wedge compression fracture of unspecified lumbar vertebra, sequela: Secondary | ICD-10-CM | POA: Diagnosis not present

## 2023-12-22 DIAGNOSIS — R937 Abnormal findings on diagnostic imaging of other parts of musculoskeletal system: Secondary | ICD-10-CM | POA: Insufficient documentation

## 2023-12-22 DIAGNOSIS — Z7901 Long term (current) use of anticoagulants: Secondary | ICD-10-CM | POA: Insufficient documentation

## 2023-12-22 DIAGNOSIS — M47816 Spondylosis without myelopathy or radiculopathy, lumbar region: Secondary | ICD-10-CM | POA: Diagnosis not present

## 2023-12-22 DIAGNOSIS — G8929 Other chronic pain: Secondary | ICD-10-CM | POA: Insufficient documentation

## 2023-12-22 DIAGNOSIS — M5459 Other low back pain: Secondary | ICD-10-CM | POA: Diagnosis not present

## 2023-12-22 DIAGNOSIS — S22080S Wedge compression fracture of T11-T12 vertebra, sequela: Secondary | ICD-10-CM | POA: Insufficient documentation

## 2023-12-22 DIAGNOSIS — S32030S Wedge compression fracture of third lumbar vertebra, sequela: Secondary | ICD-10-CM | POA: Diagnosis not present

## 2023-12-22 MED ORDER — MIDAZOLAM HCL 5 MG/5ML IJ SOLN
INTRAMUSCULAR | Status: AC
  Filled 2023-12-22: qty 5

## 2023-12-22 MED ORDER — TRIAMCINOLONE ACETONIDE 40 MG/ML IJ SUSP
INTRAMUSCULAR | Status: AC
  Filled 2023-12-22: qty 2

## 2023-12-22 MED ORDER — ROPIVACAINE HCL 2 MG/ML IJ SOLN
18.0000 mL | Freq: Once | INTRAMUSCULAR | Status: AC
  Administered 2023-12-22: 18 mL via PERINEURAL

## 2023-12-22 MED ORDER — TRIAMCINOLONE ACETONIDE 40 MG/ML IJ SUSP
80.0000 mg | Freq: Once | INTRAMUSCULAR | Status: AC
  Administered 2023-12-22: 80 mg

## 2023-12-22 MED ORDER — MIDAZOLAM HCL 5 MG/5ML IJ SOLN
0.5000 mg | Freq: Once | INTRAMUSCULAR | Status: AC
Start: 2023-12-22 — End: 2023-12-22
  Administered 2023-12-22: 1 mg via INTRAVENOUS

## 2023-12-22 MED ORDER — FENTANYL CITRATE (PF) 100 MCG/2ML IJ SOLN
INTRAMUSCULAR | Status: AC
  Filled 2023-12-22: qty 2

## 2023-12-22 MED ORDER — ROPIVACAINE HCL 2 MG/ML IJ SOLN
INTRAMUSCULAR | Status: AC
  Filled 2023-12-22: qty 20

## 2023-12-22 MED ORDER — LIDOCAINE HCL 2 % IJ SOLN
INTRAMUSCULAR | Status: AC
  Filled 2023-12-22: qty 20

## 2023-12-22 MED ORDER — SODIUM CHLORIDE 0.9 % IV SOLN: Freq: Once | INTRAVENOUS | Status: DC

## 2023-12-22 MED ORDER — TRIAMCINOLONE ACETONIDE 40 MG/ML IJ SUSP
INTRAMUSCULAR | Status: AC
  Filled 2023-12-22: qty 1

## 2023-12-22 MED ORDER — PENTAFLUOROPROP-TETRAFLUOROETH EX AERO
INHALATION_SPRAY | Freq: Once | CUTANEOUS | Status: AC
  Administered 2023-12-22: 30 via TOPICAL

## 2023-12-22 MED ORDER — FENTANYL CITRATE (PF) 100 MCG/2ML IJ SOLN: 25.0000 ug | INTRAMUSCULAR | Status: DC | PRN

## 2023-12-22 MED ORDER — LIDOCAINE HCL 2 % IJ SOLN
20.0000 mL | Freq: Once | INTRAMUSCULAR | Status: AC
  Administered 2023-12-22: 400 mg

## 2023-12-22 NOTE — Progress Notes (Signed)
 PROVIDER NOTE: Interpretation of information contained herein should be left to medically-trained personnel. Specific patient instructions are provided elsewhere under "Patient Instructions" section of medical record. This document was created in part using STT-dictation technology, any transcriptional errors that may result from this process are unintentional.  Patient: Jack [name] Type: Established DOB: Jul 21, 1933 MRN: [id number] PCP: Dorothey Baseman, MD  Service: Procedure DOS: 12/22/2023 Setting: Ambulatory Location: Ambulatory outpatient facility Delivery: Face-to-face Provider: Oswaldo Done, MD Specialty: Interventional Pain Management Specialty designation: 09 Location: Outpatient facility Ref. Prov.: Dorothey Baseman, MD       Interventional Therapy   Procedure: Thoracic facet medial branch block #1  Laterality: Bilateral (-50)  Level: T10, T11, T12, and    L1    Medial Branch Level(s) (T11-12, T12-L1, L1-2) Imaging: Fluoroscopy-guided Spinal ([id number]) Anesthesia: Local anesthesia (1-2% Lidocaine) Anxiolysis: IV Versed 1.0 mg Sedation: No Sedation                       DOS: 12/22/2023  Performed by: Oswaldo Done, MD  Purpose: Diagnostic/Therapeutic Indications: Thoracic back pain severe enough to impact quality of life or function. Rationale (medical necessity): procedure needed and proper for the diagnosis and/or treatment of [name] medical symptoms and needs. 1. Chronic low back pain (1ry area of Pain) (Bilateral) (R>L) w/o sciatica   2. Lumbar facet joint pain   3. Lumbar spine pain   4. Multilevel spine pain   5. Compression fracture of T11 vertebra, sequela   6. Compression fracture of T12 vertebra, sequela   7. Compression fracture of L1 lumbar vertebra, sequela   8. Compression fracture of L2 lumbar vertebra, sequela   9. Compression fracture of L3 lumbar vertebra, sequela   10. Compression fracture of lumbar vertebra, sequela   11.  History of kyphoplasty (T11, T12, L1, L2, and L3)   12. Abnormal MRI, lumbar spine (11/16/2023)   13. Chronic anticoagulation (Eliquis)    NAS-11 Pain score:   Pre-procedure: 5 /10   Post-procedure: 3 /10 (Pain only on right side)    Target: Thoracic posterior (dorsal) primary rami nerve Location: 2.5 cm lateral to midline (spinous process), just cephalad to upper transverse process edge.  (needle tip placement) Region: Thoracic  Approach: Paravertebral  Type of procedure: Percutaneous perineural nerve block   Pertinent Anatomy: There are two potential fascial compartments in the TPVS: the anterior extrapleural paravertebral compartment and the posterior subendothoracic paravertebral compartment. The transverse process and the superior costotransverse ligament form the posterior boundary.  Position / Prep / Materials:  Position: Prone  Prep solution: ChloraPrep (2% chlorhexidine gluconate and 70% isopropyl alcohol) Prep Area: Entire upper back region Materials:  Tray: Block Needle(s):  Type: Spinal  Gauge (G): 22  Length: 3.5-in  Qty:  4   H&P (Pre-op Assessment):  [name] is a 89 y.o. (year old), male patient, seen today for interventional treatment. 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 (jan 2009); Small intestine surgery (1946); Cystoscopy w/ ureteral stent placement (Right, 10/16/2017); Kidney stone surgery; Colon surgery; Tonsillectomy; Cataract extraction w/PHACO (Left, 01/10/2018); Cataract extraction w/PHACO (Right, 01/25/2018); Extracorporeal shock wave lithotripsy (Right, 12/11/2020); Cystoscopy/ureteroscopy/holmium laser/stent placement (Right, 12/16/2020); IR KYPHO LUMBAR INC FX REDUCE BONE BX UNI/BIL CANNULATION INC/IMAGING (02/02/2021); IR KYPHO EA ADDL LEVEL THORACIC OR LUMBAR (02/02/2021); Kyphoplasty (N/A, 03/12/2021); IR KYPHO THORACIC WITH BONE BIOPSY (04/15/2022); and IR Bone Tumor(s)RF Ablation (04/16/2022). [name] has  a current medication list  which includes the following prescription(s): alpha-lipoic acid, vitamin c, oyster shell calcium w/d, carvedilol, cyanocobalamin, dexamethasone, eliquis, gabapentin, ibuprofen, lenalidomide, lidocaine, morphine, multivitamin with minerals, multiple vitamins-minerals, naloxone, omeprazole, oxycodone hcl, polyethylene glycol, potassium citrate, and senna-docusate, and the following Facility-Administered Medications: sodium chloride and fentanyl. His primarily concern today is the Back Pain  Initial Vital Signs:  Pulse/HCG Rate: (!) 55  Temp: (!) 97.3 F (36.3 C) Resp: 16 BP: (!) 117/58 SpO2: 100 %  BMI: Estimated body mass index is 26.63 kg/m as calculated from the following:   Height as of this encounter: 5\' 7"  (1.702 m).   Weight as of this encounter: 170 lb (77.1 kg).  Risk Assessment: Allergies: Reviewed. He is allergic to quinolones, amlodipine, azithromycin, lipitor [atorvastatin], lisinopril, zetia [ezetimibe], and hydromorphone hcl.  Allergy Precautions: None required Coagulopathies: Reviewed. None identified.  Blood-thinner therapy: None at this time Active Infection(s): Reviewed. None identified. [name] is afebrile  Site Confirmation: [name] was asked to confirm the procedure and laterality before marking the site Procedure checklist: Completed Consent: Before the procedure and under the influence of no sedative(s), amnesic(s), or anxiolytics, the patient was informed of the treatment options, risks and possible complications. To fulfill our ethical and legal obligations, as recommended by the American Medical Association's Code of Ethics, I have informed the patient of my clinical impression; the nature and purpose of the treatment or procedure; the risks, benefits, and possible complications of the intervention; the alternatives, including doing nothing; the risk(s) and benefit(s) of the alternative treatment(s) or procedure(s); and the risk(s) and  benefit(s) of doing nothing. The patient was provided information about the general risks and possible complications associated with the procedure. These may include, but are not limited to: failure to achieve desired goals, infection, bleeding, organ or nerve damage, allergic reactions, paralysis, and death. In addition, the patient was informed of those risks and complications associated to Spine-related procedures, such as failure to decrease pain; infection (i.e.: Meningitis, epidural or intraspinal abscess); bleeding (i.e.: epidural hematoma, subarachnoid hemorrhage, or any other type of intraspinal or peri-dural bleeding); organ or nerve damage (i.e.: Any type of peripheral nerve, nerve root, or spinal cord injury) with subsequent damage to sensory, motor, and/or autonomic systems, resulting in permanent pain, numbness, and/or weakness of one or several areas of the body; allergic reactions; (i.e.: anaphylactic reaction); and/or death. Furthermore, the patient was informed of those risks and complications associated with the medications. These include, but are not limited to: allergic reactions (i.e.: anaphylactic or anaphylactoid reaction(s)); adrenal axis suppression; blood sugar elevation that in diabetics may result in ketoacidosis or comma; water retention that in patients with history of congestive heart failure may result in shortness of breath, pulmonary edema, and decompensation with resultant heart failure; weight gain; swelling or edema; medication-induced neural toxicity; particulate matter embolism and blood vessel occlusion with resultant organ, and/or nervous system infarction; and/or aseptic necrosis of one or more joints. Finally, the patient was informed that Medicine is not an exact science; therefore, there is also the possibility of unforeseen or unpredictable risks and/or possible complications that may result in a catastrophic outcome. The patient indicated having understood very  clearly. We have given the patient no guarantees and we have made no promises. Enough time was given to the patient to ask questions, all of which were answered to the patient's satisfaction. Mr. Brass has indicated that he wanted to continue with the procedure. Attestation: I, the ordering provider, attest that I have discussed with the patient the  benefits, risks, side-effects, alternatives, likelihood of achieving goals, and potential problems during recovery for the procedure that I have provided informed consent. Date  Time: 12/22/2023  7:59 AM  Pre-Procedure Preparation:  Monitoring: As per clinic protocol. Respiration, ETCO2, SpO2, BP, heart rate and rhythm monitor placed and checked for adequate function Safety Precautions: Patient was assessed for positional comfort and pressure points before starting the procedure. Time-out: I initiated and conducted the "Time-out" before starting the procedure, as per protocol. The patient was asked to participate by confirming the accuracy of the "Time Out" information. Verification of the correct person, site, and procedure were performed and confirmed by me, the nursing staff, and the patient. "Time-out" conducted as per Joint Commission's Universal Protocol (UP.01.01.01). Time: 0830 Start Time: 0831 hrs.  Description/Narrative of Procedure:          Start Time: 0831 hrs. Technical description of procedure:  Rationale (medical necessity): procedure needed and proper for the diagnosis and/or treatment of the patient's medical symptoms and needs. Procedural Technique Safety Precautions: Aspiration looking for blood return was conducted prior to all injections. At no point did we inject any substances, as a needle was being advanced. No attempts were made at seeking any paresthesias. Safe injection practices and needle disposal techniques used. Medications properly checked for expiration dates. SDV (single dose vial) medications used. Target Area: For the  thoracic medial branch nerve, the target is located between the top of the transverse processes at the point where the transverse process joints the vertebra and the superior-lateral aspect of the transverse process.  (More lateral towards the superior aspect of the thoracic spine.) For safety reasons and to minimize the risk of hemo- or pneumothorax, the needle tip was not advanced past the boundary of the posterior paravertebral compartment (superior costotransverse ligament). Description of the Procedure: Protocol guidelines were followed. The patient was placed in position over the fluoroscopy table. The target area was identified and the area prepped in the usual manner. Skin & deeper tissues infiltrated with local anesthetic. Appropriate amount of time allowed to pass for local anesthetics to take effect. The procedure needles were then advanced to the target area. Proper needle placement secured. Negative aspiration confirmed. Solution injected in intermittent fashion, asking for systemic symptoms every 0.5cc of injectate. The needles were then removed and the area cleansed, making sure to leave some of the prepping solution back to take advantage of its long term bactericidal properties.             Facet Joint Innervation  T1-2 C8, T1  Medial Branch  T2-3 T1, T2         "          "  T3-4 T2, T3         "          "  T4-5 T3, T4         "          "  T5-6 T4, T5         "          "  T6-7 T5, T6         "          "  T7-8 T6, T7         "          "  T8-9 T7, T8         "          "  T9-10 T8, T9         "          "  T10-11 T9, T10         "          "  T11-12 T10, T11         "          "  T12-L1 T11, T12         "          "    Vitals:   12/22/23 0830 12/22/23 0835 12/22/23 0840 12/22/23 0849  BP: (!) 150/83 (!) 125/90 138/73 107/67  Pulse: (!) 48 (!) 48 (!) 53 (!) 44  Resp: 13 15 16 16   Temp:      TempSrc:      SpO2: 98% 99% 100% 100%  Weight:      Height:          End Time: 0840 hrs.  Imaging Guidance (Spinal):          Type of Imaging Technique: Fluoroscopy Guidance (Spinal) Indication(s): Fluoroscopy guidance for needle placement to enhance accuracy in procedures requiring precise needle localization for targeted delivery of medication in or near specific anatomical locations not easily accessible without such real-time imaging assistance. Exposure Time: Please see nurses notes. Contrast: None used. Fluoroscopic Guidance: I was personally present during the use of fluoroscopy. "Tunnel Vision Technique" used to obtain the best possible view of the target area. Parallax error corrected before commencing the procedure. "Direction-depth-direction" technique used to introduce the needle under continuous pulsed fluoroscopy. Once target was reached, antero-posterior, oblique, and lateral fluoroscopic projection used confirm needle placement in all planes. Images permanently stored in EMR. Interpretation: No contrast injected. I personally interpreted the imaging intraoperatively. Adequate needle placement confirmed in multiple planes. Permanent images saved into the patient's record.  Post-operative Assessment:  Post-procedure Vital Signs:  Pulse/HCG Rate: (!) 44  Temp: (!) 97.3 F (36.3 C) Resp: 16 BP: 107/67 SpO2: 100 %  EBL: None  Complications: No immediate post-treatment complications observed by team, or reported by patient.  Note: The patient tolerated the entire procedure well. A repeat set of vitals were taken after the procedure and the patient was kept under observation following institutional policy, for this type of procedure. Post-procedural neurological assessment was performed, showing return to baseline, prior to discharge. The patient was provided with post-procedure discharge instructions, including a section on how to identify potential problems. Should any problems arise concerning this procedure, the patient was given instructions  to immediately contact us, at any time, without hesitation. In any case, we plan to contact the patient by telephone for a follow-up status report regarding this interventional procedure.  Comments:  No additional relevant information.  Plan of Care (POC)  Orders:  Orders Placed This Encounter  Procedures   LUMBAR FACET(MEDIAL BRANCH NERVE BLOCK) MBNB    Scheduling Instructions:     Procedure: Lumbar facet block (AKA.: Lumbosacral medial branch nerve block)     Side: Bilateral     Level: L3-4, L4-5, and L5-S1 Facets (L2, L3, L4, L5, and S1 Medial Branch Nerves)     Sedation: Patient's choice.     Timeframe: Today    Where will this procedure be performed?:   [facility]   [facility] C-ARM 1-60 MIN NO REPORT    Intraoperative interpretation by procedural physician at [facility].    Standing Status:   Standing  Number of Occurrences:   1    Reason for exam::   Assistance in needle guidance and placement for procedures requiring needle placement in or near specific anatomical locations not easily accessible without such assistance.   Informed Consent Details: Physician/Practitioner Attestation; Transcribe to consent form and obtain patient signature    Nursing Order: Transcribe to consent form and obtain patient signature. Note: Always confirm laterality of pain with Mr. Gomillion, before procedure.    Physician/Practitioner attestation of informed consent for procedure/surgical case:   I, the physician/practitioner, attest that I have discussed with the patient the benefits, risks, side effects, alternatives, likelihood of achieving goals and potential problems during recovery for the procedure that I have provided informed consent.    Procedure:   Lumbar Facet Block  under fluoroscopic guidance    Physician/Practitioner performing the procedure:   Normajean Nash A. Laban Emperor MD    Indication/Reason:   Low Back Pain, with our without leg pain, due to Facet Joint Arthralgia  (Joint Pain) Spondylosis (Arthritis of the Spine), without myelopathy or radiculopathy (Nerve Damage).   Care order/instruction: Please confirm that the patient has stopped the Eliquis (Apixaban) x 3 days prior to procedure or surgery.    Please confirm that the patient has stopped the Eliquis (Apixaban) x 3 days prior to procedure or surgery.    Standing Status:   Standing    Number of Occurrences:   1   Provide equipment / supplies at bedside    Procedure tray: "Block Tray" (Disposable  single use) Skin infiltration needle: Regular 1.5-in, 25-G, (x1) Block Needle type: Spinal Amount/quantity: 4 Size: Regular (3.5-inch) Gauge: 22G    Standing Status:   Standing    Number of Occurrences:   1    Specify:   Block Tray   Bleeding precautions    Standing Status:   Standing    Number of Occurrences:   1   Chronic Opioid Analgesic:   No chronic opioid analgesics therapy prescribed by our practice. oxycodone IR 10 mg tablet, 2 tabs p.o. 3 times daily (# 90) (last filled on 03/11/2023) (90 MME); morphine ER 30 mg tablet, 1 tab p.o. 3 times daily (# 90) (last filled on 03/24/2023) (90 MME) by Jeralyn Ruths, MD (Oncology)  MME/day: 180 mg/day    Medications ordered for procedure: Meds ordered this encounter  Medications   lidocaine (XYLOCAINE) 2 % (with pres) injection 400 mg   pentafluoroprop-tetrafluoroeth (GEBAUERS) aerosol   0.9 %  sodium chloride infusion   midazolam (VERSED) 5 MG/5ML injection 0.5-2 mg    Make sure Flumazenil is available in the pyxis when using this medication. If oversedation occurs, administer 0.2 mg IV over 15 sec. If after 45 sec no response, administer 0.2 mg again over 1 min; may repeat at 1 min intervals; not to exceed 4 doses (1 mg)   fentaNYL (SUBLIMAZE) injection 25-50 mcg    Make sure Narcan is available in the pyxis when using this medication. In the event of respiratory depression (RR< 8/min): Titrate NARCAN (naloxone) in increments of 0.1 to 0.2 mg  IV at 2-3 minute intervals, until desired degree of reversal.   ropivacaine (PF) 2 mg/mL (0.2%) (NAROPIN) injection 18 mL   triamcinolone acetonide (KENALOG-40) injection 80 mg   Medications administered: We administered lidocaine, pentafluoroprop-tetrafluoroeth, midazolam, ropivacaine (PF) 2 mg/mL (0.2%), and triamcinolone acetonide.  See the medical record for exact dosing, route, and time of administration.  Follow-up plan:   Return in about 2 weeks (around 01/05/2024) for (  Face2F), (PPE).       Interventional Therapies  Risk Factors  Considerations  Comorbidities ELIQUIS Anticoagulation (Stop: 3 days  Restart: 6 hours)  Multiple Myeloma  polypharmacy  High Dose opioid Therapy  Stage3b CKD  ascending aortic aneurysm  diastolic dysfunction  GERD  A-fib  nephrolithiasis  HTN  MV insufficiency     Planned  Pending:   Diagnostic/therapeutic midline thoracolumbar LESI #1    Under consideration:   Diagnostic/therapeutic midline thoracolumbar LESI #1    Completed:   None at this time   Therapeutic  Palliative (PRN) options:   None established   Completed by other providers:   None reported      Recent Visits Date Type Provider Dept  12/13/23 Office Visit Delano Metz, MD [facility]  11/22/23 Procedure visit Delano Metz, MD [facility]  11/16/23 Office Visit Delano Metz, MD [facility]  Showing recent visits within past 90 days and meeting all other requirements Today's Visits Date Type Provider Dept  12/22/23 Procedure visit Delano Metz, MD [facility]  Showing today's visits and meeting all other requirements Future Appointments Date Type Provider Dept  01/10/24 Appointment Delano Metz, MD [facility]  Showing future appointments within next 90 days and meeting all other requirements  Disposition: Discharge home  Discharge (Date  Time): 12/22/2023; 0857 hrs.   Primary  Care Physician: Dorothey Baseman, MD Location: [facility] Note by: Oswaldo Done, MD (TTS technology used. I apologize for any typographical errors that were not detected and corrected.) Date: 12/22/2023; Time: 9:01 AM  Disclaimer:  Medicine is not an Visual merchandiser. The only guarantee in medicine is that nothing is guaranteed. It is important to note that the decision to proceed with this intervention was based on the information collected from the patient. The Data and conclusions were drawn from the patient's questionnaire, the interview, and the physical examination. Because the information was provided in large part by the patient, it cannot be guaranteed that it has not been purposely or unconsciously manipulated. Every effort has been made to obtain as much relevant data as possible for this evaluation. It is important to note that the conclusions that lead to this procedure are derived in large part from the available data. Always take into account that the treatment will also be dependent on availability of resources and existing treatment guidelines, considered by other Pain Management Practitioners as being common knowledge and practice, at the time of the intervention. For Medico-Legal purposes, it is also important to point out that variation in procedural techniques and pharmacological choices are the acceptable norm. The indications, contraindications, technique, and results of the above procedure should only be interpreted and judged by a Board-Certified Interventional Pain Specialist with extensive familiarity and expertise in the same exact procedure and technique.

## 2023-12-22 NOTE — Patient Instructions (Signed)
 ______________________________________________________________________    Post-Procedure Discharge Instructions  Instructions: Apply ice:  Purpose: This will minimize any swelling and discomfort after procedure.  When: Day of procedure, as soon as you get home. How: Fill a plastic sandwich bag with crushed ice. Cover it with a small towel and apply to injection site. How long: (15 min on, 15 min off) Apply for 15 minutes then remove x 15 minutes.  Repeat sequence on day of procedure, until you go to bed. Apply heat:  Purpose: To treat any soreness and discomfort from the procedure. When: Starting the next day after the procedure. How: Apply heat to procedure site starting the day following the procedure. How long: May continue to repeat daily, until discomfort goes away. Food intake: Start with clear liquids (like water) and advance to regular food, as tolerated.  Physical activities: Keep activities to a minimum for the first 8 hours after the procedure. After that, then as tolerated. Driving: If you have received any sedation, be responsible and do not drive. You are not allowed to drive for 24 hours after having sedation. Blood thinner: (Applies only to those taking blood thinners) You may restart your blood thinner 6 hours after your procedure. Insulin: (Applies only to Diabetic patients taking insulin) As soon as you can eat, you may resume your normal dosing schedule. Infection prevention: Keep procedure site clean and dry. Shower daily and clean area with soap and water. Post-procedure Pain Diary: Extremely important that this be done correctly and accurately. Recorded information will be used to determine the next step in treatment. For the purpose of accuracy, follow these rules: Evaluate only the area treated. Do not report or include pain from an untreated area. For the purpose of this evaluation, ignore all other areas of pain, except for the treated area. After your procedure,  avoid taking a long nap and attempting to complete the pain diary after you wake up. Instead, set your alarm clock to go off every hour, on the hour, for the initial 8 hours after the procedure. Document the duration of the numbing medicine, and the relief you are getting from it. Do not go to sleep and attempt to complete it later. It will not be accurate. If you received sedation, it is likely that you were given a medication that may cause amnesia. Because of this, completing the diary at a later time may cause the information to be inaccurate. This information is needed to plan your care. Follow-up appointment: Keep your post-procedure follow-up evaluation appointment after the procedure (usually 2 weeks for most procedures, 6 weeks for radiofrequencies). DO NOT FORGET to bring you pain diary with you.   Expect: (What should I expect to see with my procedure?) From numbing medicine (AKA: Local Anesthetics): Numbness or decrease in pain. You may also experience some weakness, which if present, could last for the duration of the local anesthetic. Onset: Full effect within 15 minutes of injected. Duration: It will depend on the type of local anesthetic used. On the average, 1 to 8 hours.  From steroids (Applies only if steroids were used): Decrease in swelling or inflammation. Once inflammation is improved, relief of the pain will follow. Onset of benefits: Depends on the amount of swelling present. The more swelling, the longer it will take for the benefits to be seen. In some cases, up to 10 days. Duration: Steroids will stay in the system x 2 weeks. Duration of benefits will depend on multiple posibilities including persistent irritating factors. Side-effects: If  present, they may typically last 2 weeks (the duration of the steroids). Frequent: Cramps (if they occur, drink Gatorade and take over-the-counter Magnesium 450-500 mg once to twice a day); water retention with temporary weight gain;  increases in blood sugar; decreased immune system response; increased appetite. Occasional: Facial flushing (red, warm cheeks); mood swings; menstrual changes. Uncommon: Long-term decrease or suppression of natural hormones; bone thinning. (These are more common with higher doses or more frequent use. This is why we prefer that our patients avoid having any injection therapies in other practices.)  Very Rare: Severe mood changes; psychosis; aseptic necrosis. From procedure: Some discomfort is to be expected once the numbing medicine wears off. This should be minimal if ice and heat are applied as instructed.  Call if: (When should I call?) You experience numbness and weakness that gets worse with time, as opposed to wearing off. New onset bowel or bladder incontinence. (Applies only to procedures done in the spine)  Emergency Numbers: Durning business hours (Monday - Thursday, 8:00 AM - 4:00 PM) (Friday, 9:00 AM - 12:00 Noon): [phone] After hours: [phone] NOTE: If you are having a problem and are unable connect with, or to talk to a provider, then go to your nearest urgent care or emergency department. If the problem is serious and urgent, please call 911. ______________________________________________________________________      ______________________________________________________________________    Blood Thinners  IMPORTANT NOTICE:  If you take any of these, make sure to notify the nursing staff.  Failure to do so may result in serious injury.  Recommended time intervals to stop and restart blood-thinners, before & after invasive procedures  Generic Name Brand Name Pre-procedure: Stop medication for this amount of time before your procedure: Post-procedure: Wait this amount of time after the procedure before restarting your medication:  Abciximab Reopro 15 days 2 hrs  Alteplase Activase 10 days 10 days  Anagrelide Agrylin    Apixaban Eliquis 3 days 6 hrs   Cilostazol Pletal 3 days 5 hrs  Clopidogrel Plavix 7-10 days 2 hrs  Dabigatran Pradaxa 5 days 6 hrs  Dalteparin Fragmin 24 hours 4 hrs  Dipyridamole Aggrenox 11days 2 hrs  Edoxaban Lixiana; Savaysa 3 days 2 hrs  Enoxaparin  Lovenox 24 hours 4 hrs  Eptifibatide Integrillin 8 hours 2 hrs  Fondaparinux  Arixtra 72 hours 12 hrs  Hydroxychloroquine Plaquenil 11 days   Prasugrel Effient 7-10 days 6 hrs  Reteplase Retavase 10 days 10 days  Rivaroxaban Xarelto 3 days 6 hrs  Ticagrelor Brilinta 5-7 days 6 hrs  Ticlopidine Ticlid 10-14 days 2 hrs  Tinzaparin Innohep 24 hours 4 hrs  Tirofiban Aggrastat 8 hours 2 hrs  Warfarin Coumadin 5 days 2 hrs   Other medications with blood-thinning effects  NOTE: Consider stopping these if you have prolonged bleeding despite not taking any of the above blood thinners. Otherwise ask your provider and this will be decided on a case-by-case basis.  Product indications Generic (Brand) names Note  Cholesterol Lipitor Stop 4 days before procedure  Blood thinner (injectable) Heparin (LMW or LMWH Heparin) Stop 24 hours before procedure  Cancer Ibrutinib (Imbruvica) Stop 7 days before procedure  Malaria/Rheumatoid Hydroxychloroquine (Plaquenil) Stop 11 days before procedure  Thrombolytics  10 days before or after procedures   Over-the-counter (OTC) Products with blood-thinning effects  NOTE: Consider stopping these if you have prolonged bleeding despite not taking any of the above blood thinners. Otherwise ask your provider and this will be decided on a case-by-case basis.  Product Common names Stop Time  Aspirin > 325 mg Goody Powders, Excedrin, etc. 11 days  Aspirin <= 81 mg  7 days  Fish oil  4 days  Garlic supplements  7 days  Ginkgo biloba  36 hours  Ginseng  24 hours  NSAIDs Ibuprofen, Naprosyn, etc. 3 days  Vitamin E  4 days   ______________________________________________________________________

## 2023-12-22 NOTE — Progress Notes (Deleted)
 PROVIDER NOTE: Interpretation of information contained herein should be left to medically-trained personnel. Specific patient instructions are provided elsewhere under "Patient Instructions" section of medical record. This document was created in part using STT-dictation technology, any transcriptional errors that may result from this process are unintentional.  Patient: Jack [name] Type: Established DOB: 1932-11-02 MRN: [id number] PCP: Dorothey Baseman, MD  Service: Procedure DOS: 12/22/2023 Setting: Ambulatory Location: Ambulatory outpatient facility Delivery: Face-to-face Provider: Oswaldo Done, MD Specialty: Interventional Pain Management Specialty designation: 09 Location: Outpatient facility Ref. Prov.: Dorothey Baseman, MD       Interventional Therapy   Type: Lumbar Facet, Medial Branch Block(s) (w/ fluoroscopic mapping)          Laterality: Bilateral  Level: L2, L3, L4, L5, and S1 Medial Branch Level(s). Injecting these levels blocks the L3-4, L4-5, and L5-S1 lumbar facet joints. Imaging: Fluoroscopic guidance Spinal ([id number]) Anesthesia: Local anesthesia (1-2% Lidocaine) Anxiolysis: IV Versed         Sedation:                         DOS: 12/22/2023 Performed by: Oswaldo Done, MD  Primary Purpose: Diagnostic/Therapeutic Indications: Low back pain severe enough to impact quality of life or function. 1. Chronic low back pain (1ry area of Pain) (Bilateral) (R>L) w/o sciatica   2. Lumbar facet joint pain   3. Lumbar spine pain   4. Multilevel spine pain   5. Compression fracture of L1 lumbar vertebra, sequela   6. Compression fracture of L2 lumbar vertebra, sequela   7. Compression fracture of L3 lumbar vertebra, sequela   8. Compression fracture of lumbar vertebra, sequela   9. Abnormal MRI, lumbar spine (11/16/2023)   10. Chronic anticoagulation (Eliquis)    NAS-11 Pain score:   Pre-procedure:  /10   Post-procedure:  /10     Position / Prep /  Materials:  Position: Prone  Prep solution: ChloraPrep (2% chlorhexidine gluconate and 70% isopropyl alcohol) Area Prepped: Posterolateral Lumbosacral Spine (Wide prep: From the lower border of the scapula down to the end of the tailbone and from flank to flank.)  Materials:  Tray: Block Needle(s):  Type: Spinal  Gauge (G): 22  Length: 3.5-in Qty: 4     H&P (Pre-op Assessment):  Jack [name] is a 89 y.o. (year old), male patient, seen today for interventional treatment. 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 (jan 2009); Small intestine surgery (1946); Cystoscopy w/ ureteral stent placement (Right, 10/16/2017); Kidney stone surgery; Colon surgery; Tonsillectomy; Cataract extraction w/PHACO (Left, 01/10/2018); Cataract extraction w/PHACO (Right, 01/25/2018); Extracorporeal shock wave lithotripsy (Right, 12/11/2020); Cystoscopy/ureteroscopy/holmium laser/stent placement (Right, 12/16/2020); IR KYPHO LUMBAR INC FX REDUCE BONE BX UNI/BIL CANNULATION INC/IMAGING (02/02/2021); IR KYPHO EA ADDL LEVEL THORACIC OR LUMBAR (02/02/2021); Kyphoplasty (N/A, 03/12/2021); IR KYPHO THORACIC WITH BONE BIOPSY (04/15/2022); and IR Bone Tumor(s)RF Ablation (04/16/2022). Jack [name] has a current medication list which includes the following prescription(s): alpha-lipoic acid, vitamin c, oyster shell calcium w/d, carvedilol, cyanocobalamin, dexamethasone, eliquis, gabapentin, ibuprofen, lenalidomide, lidocaine, morphine, multivitamin with minerals, multiple vitamins-minerals, naloxone, omeprazole, oxycodone hcl, polyethylene glycol, potassium citrate, and senna-docusate, and the following Facility-Administered Medications: fentanyl, lidocaine, midazolam, pentafluoroprop-tetrafluoroeth, ropivacaine (pf) 2 mg/ml (0.2%), and triamcinolone acetonide. His primarily concern today is the No chief complaint on file.  Initial Vital Signs:  Pulse/HCG Rate:    Temp:   Resp:   BP:   SpO2:  BMI: Estimated body mass index is 26.63 kg/m as calculated from the following:   Height as of 12/13/23: 5\' 7"  (1.702 m).   Weight as of 12/13/23: 170 lb (77.1 kg).  Risk Assessment: Allergies: Reviewed. He is allergic to quinolones, amlodipine, azithromycin, lipitor [atorvastatin], lisinopril, zetia [ezetimibe], and hydromorphone hcl.  Allergy Precautions: None required Coagulopathies: Reviewed. None identified.  Blood-thinner therapy: None at this time Active Infection(s): Reviewed. None identified. Jack [name] is afebrile  Site Confirmation: Jack [name] was asked to confirm the procedure and laterality before marking the site Procedure checklist: Completed Consent: Before the procedure and under the influence of no sedative(s), amnesic(s), or anxiolytics, the patient was informed of the treatment options, risks and possible complications. To fulfill our ethical and legal obligations, as recommended by the American Medical Association's Code of Ethics, I have informed the patient of my clinical impression; the nature and purpose of the treatment or procedure; the risks, benefits, and possible complications of the intervention; the alternatives, including doing nothing; the risk(s) and benefit(s) of the alternative treatment(s) or procedure(s); and the risk(s) and benefit(s) of doing nothing. The patient was provided information about the general risks and possible complications associated with the procedure. These may include, but are not limited to: failure to achieve desired goals, infection, bleeding, organ or nerve damage, allergic reactions, paralysis, and death. In addition, the patient was informed of those risks and complications associated to Spine-related procedures, such as failure to decrease pain; infection (i.e.: Meningitis, epidural or intraspinal abscess); bleeding (i.e.: epidural hematoma, subarachnoid hemorrhage, or any other type of intraspinal or peri-dural bleeding); organ or nerve  damage (i.e.: Any type of peripheral nerve, nerve root, or spinal cord injury) with subsequent damage to sensory, motor, and/or autonomic systems, resulting in permanent pain, numbness, and/or weakness of one or several areas of the body; allergic reactions; (i.e.: anaphylactic reaction); and/or death. Furthermore, the patient was informed of those risks and complications associated with the medications. These include, but are not limited to: allergic reactions (i.e.: anaphylactic or anaphylactoid reaction(s)); adrenal axis suppression; blood sugar elevation that in diabetics may result in ketoacidosis or comma; water retention that in patients with history of congestive heart failure may result in shortness of breath, pulmonary edema, and decompensation with resultant heart failure; weight gain; swelling or edema; medication-induced neural toxicity; particulate matter embolism and blood vessel occlusion with resultant organ, and/or nervous system infarction; and/or aseptic necrosis of one or more joints. Finally, the patient was informed that Medicine is not an exact science; therefore, there is also the possibility of unforeseen or unpredictable risks and/or possible complications that may result in a catastrophic outcome. The patient indicated having understood very clearly. We have given the patient no guarantees and we have made no promises. Enough time was given to the patient to ask questions, all of which were answered to the patient's satisfaction. Jack [name] has indicated that he wanted to continue with the procedure. Attestation: I, the ordering provider, attest that I have discussed with the patient the benefits, risks, side-effects, alternatives, likelihood of achieving goals, and potential problems during recovery for the procedure that I have provided informed consent. Date  Time: {CHL [facility]-PA[facility] TIME CHOICES:21018001}  Pre-Procedure Preparation:  Monitoring: As per clinic protocol. Respiration,  ETCO2, SpO2, BP, heart rate and rhythm monitor placed and checked for adequate function Safety Precautions: Patient was assessed for positional comfort and pressure points before starting the procedure. Time-out: I initiated and conducted the "Time-out" before starting the procedure, as per  protocol. The patient was asked to participate by confirming the accuracy of the "Time Out" information. Verification of the correct person, site, and procedure were performed and confirmed by me, the nursing staff, and the patient. "Time-out" conducted as per Joint Commission's Universal Protocol (UP.01.01.01). Time:   Start Time:   hrs.  Description of Procedure:          Laterality: (see above) Targeted Levels: (see above)  Safety Precautions: Aspiration looking for blood return was conducted prior to all injections. At no point did we inject any substances, as a needle was being advanced. Before injecting, the patient was told to immediately notify me if he was experiencing any new onset of "ringing in the ears, or metallic taste in the mouth". No attempts were made at seeking any paresthesias. Safe injection practices and needle disposal techniques used. Medications properly checked for expiration dates. SDV (single dose vial) medications used. After the completion of the procedure, all disposable equipment used was discarded in the proper designated medical waste containers. Local Anesthesia: Protocol guidelines were followed. The patient was positioned over the fluoroscopy table. The area was prepped in the usual manner. The time-out was completed. The target area was identified using fluoroscopy. A 12-in long, straight, sterile hemostat was used with fluoroscopic guidance to locate the targets for each level blocked. Once located, the skin was marked with an approved surgical skin marker. Once all sites were marked, the skin (epidermis, dermis, and hypodermis), as well as deeper tissues (fat, connective tissue  and muscle) were infiltrated with a small amount of a short-acting local anesthetic, loaded on a 10cc syringe with a 25G, 1.5-in  Needle. An appropriate amount of time was allowed for local anesthetics to take effect before proceeding to the next step. Local Anesthetic: Lidocaine 2.0% The unused portion of the local anesthetic was discarded in the proper designated containers. Technical description of process:  L2 Medial Branch Nerve Block (MBB): The target area for the L2 medial branch is at the junction of the postero-lateral aspect of the superior articular process and the superior, posterior, and medial edge of the transverse process of L3. Under fluoroscopic guidance, a Quincke needle was inserted until contact was made with os over the superior postero-lateral aspect of the pedicular shadow (target area). After negative aspiration for blood, 0.5 mL of the nerve block solution was injected without difficulty or complication. The needle was removed intact. L3 Medial Branch Nerve Block (MBB): The target area for the L3 medial branch is at the junction of the postero-lateral aspect of the superior articular process and the superior, posterior, and medial edge of the transverse process of L4. Under fluoroscopic guidance, a Quincke needle was inserted until contact was made with os over the superior postero-lateral aspect of the pedicular shadow (target area). After negative aspiration for blood, 0.5 mL of the nerve block solution was injected without difficulty or complication. The needle was removed intact. L4 Medial Branch Nerve Block (MBB): The target area for the L4 medial branch is at the junction of the postero-lateral aspect of the superior articular process and the superior, posterior, and medial edge of the transverse process of L5. Under fluoroscopic guidance, a Quincke needle was inserted until contact was made with os over the superior postero-lateral aspect of the pedicular shadow (target area).  After negative aspiration for blood, 0.5 mL of the nerve block solution was injected without difficulty or complication. The needle was removed intact. L5 Medial Branch Nerve Block (MBB): The target area  for the L5 medial branch is at the junction of the postero-lateral aspect of the superior articular process and the superior, posterior, and medial edge of the sacral ala. Under fluoroscopic guidance, a Quincke needle was inserted until contact was made with os over the superior postero-lateral aspect of the pedicular shadow (target area). After negative aspiration for blood, 0.5 mL of the nerve block solution was injected without difficulty or complication. The needle was removed intact. S1 Medial Branch Nerve Block (MBB): The target area for the S1 medial branch is at the posterior and inferior 6 o'clock position of the L5-S1 facet joint. Under fluoroscopic guidance, the Quincke needle inserted for the L5 MBB was redirected until contact was made with os over the inferior and postero aspect of the sacrum, at the 6 o' clock position under the L5-S1 facet joint (Target area). After negative aspiration for blood, 0.5 mL of the nerve block solution was injected without difficulty or complication. The needle was removed intact.  Once the entire procedure was completed, the treated area was cleaned, making sure to leave some of the prepping solution back to take advantage of its long term bactericidal properties.         Illustration of the posterior view of the lumbar spine and the posterior neural structures. Laminae of L2 through S1 are labeled. DPRL5, dorsal primary ramus of L5; DPRS1, dorsal primary ramus of S1; DPR3, dorsal primary ramus of L3; FJ, facet (zygapophyseal) joint L3-L4; I, inferior articular process of L4; LB1, lateral branch of dorsal primary ramus of L1; IAB, inferior articular branches from L3 medial branch (supplies L4-L5 facet joint); IBP, intermediate branch plexus; MB3, medial branch  of dorsal primary ramus of L3; NR3, third lumbar nerve root; S, superior articular process of L5; SAB, superior articular branches from L4 (supplies L4-5 facet joint also); TP3, transverse process of L3.   Facet Joint Innervation (* possible contribution)  L1-2 T12, L1 (L2*)  Medial Branch  L2-3 L1, L2 (L3*)         "          "  L3-4 L2, L3 (L4*)         "          "  L4-5 L3, L4 (L5*)         "          "  L5-S1 L4, L5, S1          "          "    There were no vitals filed for this visit.   End Time:   hrs.  Imaging Guidance (Spinal):          Type of Imaging Technique: Fluoroscopy Guidance (Spinal) Indication(s): Fluoroscopy guidance for needle placement to enhance accuracy in procedures requiring precise needle localization for targeted delivery of medication in or near specific anatomical locations not easily accessible without such real-time imaging assistance. Exposure Time: Please see nurses notes. Contrast: None used. Fluoroscopic Guidance: I was personally present during the use of fluoroscopy. "Tunnel Vision Technique" used to obtain the best possible view of the target area. Parallax error corrected before commencing the procedure. "Direction-depth-direction" technique used to introduce the needle under continuous pulsed fluoroscopy. Once target was reached, antero-posterior, oblique, and lateral fluoroscopic projection used confirm needle placement in all planes. Images permanently stored in EMR. Interpretation: No contrast injected. I personally interpreted the imaging intraoperatively. Adequate needle placement confirmed in multiple planes. Permanent images saved into  the patient's record.  Post-operative Assessment:  Post-procedure Vital Signs:  Pulse/HCG Rate:    Temp:   Resp:   BP:   SpO2:    EBL: None  Complications: No immediate post-treatment complications observed by team, or reported by patient.  Note: The patient tolerated the entire procedure well. A  repeat set of vitals were taken after the procedure and the patient was kept under observation following institutional policy, for this type of procedure. Post-procedural neurological assessment was performed, showing return to baseline, prior to discharge. The patient was provided with post-procedure discharge instructions, including a section on how to identify potential problems. Should any problems arise concerning this procedure, the patient was given instructions to immediately contact us, at any time, without hesitation. In any case, we plan to contact the patient by telephone for a follow-up status report regarding this interventional procedure.  Comments:  No additional relevant information.  Plan of Care (POC)  Orders:  Orders Placed This Encounter  Procedures   LUMBAR FACET(MEDIAL BRANCH NERVE BLOCK) MBNB    Scheduling Instructions:     Procedure: Lumbar facet block (AKA.: Lumbosacral medial branch nerve block)     Side: Bilateral     Level: L3-4, L4-5, and L5-S1 Facets (L2, L3, L4, L5, and S1 Medial Branch Nerves)     Sedation: Patient's choice.     Timeframe: Today    Where will this procedure be performed?:   [facility]   DG [facility] C-ARM 1-60 MIN NO REPORT    Intraoperative interpretation by procedural physician at [facility].    Standing Status:   Standing    Number of Occurrences:   1    Reason for exam::   Assistance in needle guidance and placement for procedures requiring needle placement in or near specific anatomical locations not easily accessible without such assistance.   Provide equipment / supplies at bedside    Procedure tray: "Block Tray" (Disposable  single use) Skin infiltration needle: Regular 1.5-in, 25-G, (x1) Block Needle type: Spinal Amount/quantity: 4 Size: Regular (3.5-inch) Gauge: 22G    Standing Status:   Standing    Number of Occurrences:   1    Specify:   Block Tray   Chronic Opioid Analgesic:   No chronic opioid  analgesics therapy prescribed by our practice. oxycodone IR 10 mg tablet, 2 tabs p.o. 3 times daily (# 90) (last filled on 03/11/2023) (90 MME); morphine ER 30 mg tablet, 1 tab p.o. 3 times daily (# 90) (last filled on 03/24/2023) (90 MME) by Jeralyn Ruths, MD (Oncology)  MME/day: 180 mg/day    Medications ordered for procedure: Meds ordered this encounter  Medications   lidocaine (XYLOCAINE) 2 % (with pres) injection 400 mg   pentafluoroprop-tetrafluoroeth (GEBAUERS) aerosol   midazolam (VERSED) 5 MG/5ML injection 0.5-2 mg    Make sure Flumazenil is available in the pyxis when using this medication. If oversedation occurs, administer 0.2 mg IV over 15 sec. If after 45 sec no response, administer 0.2 mg again over 1 min; may repeat at 1 min intervals; not to exceed 4 doses (1 mg)   fentaNYL (SUBLIMAZE) injection 25-50 mcg    Make sure Narcan is available in the pyxis when using this medication. In the event of respiratory depression (RR< 8/min): Titrate NARCAN (naloxone) in increments of 0.1 to 0.2 mg IV at 2-3 minute intervals, until desired degree of reversal.   ropivacaine (PF) 2 mg/mL (0.2%) (NAROPIN) injection 18 mL   triamcinolone acetonide (  KENALOG-40) injection 80 mg   Medications administered: [name] had no medications administered during this visit.  See the medical record for exact dosing, route, and time of administration.  Follow-up plan:   No follow-ups on file.       Interventional Therapies  Risk Factors  Considerations  Comorbidities ELIQUIS Anticoagulation (Stop: 3 days  Restart: 6 hours)  Multiple Myeloma  polypharmacy  High Dose opioid Therapy  Stage3b CKD  ascending aortic aneurysm  diastolic dysfunction  GERD  A-fib  nephrolithiasis  HTN  MV insufficiency     Planned  Pending:   Diagnostic/therapeutic midline thoracolumbar LESI #1    Under consideration:   Diagnostic/therapeutic midline thoracolumbar LESI #1    Completed:   None  at this time   Therapeutic  Palliative (PRN) options:   None established   Completed by other providers:   None reported      Recent Visits Date Type Provider Dept  12/13/23 Office Visit Delano Metz, MD [facility]  11/22/23 Procedure visit Delano Metz, MD [facility]  11/16/23 Office Visit Delano Metz, MD [facility]  Showing recent visits within past 90 days and meeting all other requirements Today's Visits Date Type Provider Dept  12/22/23 Procedure visit Delano Metz, MD [facility]  Showing today's visits and meeting all other requirements Future Appointments No visits were found meeting these conditions. Showing future appointments within next 90 days and meeting all other requirements  Disposition: Discharge home  Discharge (Date  Time): 12/22/2023;   hrs.   Primary Care Physician: Dorothey Baseman, MD Location: [facility] Note by: Oswaldo Done, MD (TTS technology used. I apologize for any typographical errors that were not detected and corrected.) Date: 12/22/2023; Time: 7:51 AM  Disclaimer:  Medicine is not an Visual merchandiser. The only guarantee in medicine is that nothing is guaranteed. It is important to note that the decision to proceed with this intervention was based on the information collected from the patient. The Data and conclusions were drawn from the patient's questionnaire, the interview, and the physical examination. Because the information was provided in large part by the patient, it cannot be guaranteed that it has not been purposely or unconsciously manipulated. Every effort has been made to obtain as much relevant data as possible for this evaluation. It is important to note that the conclusions that lead to this procedure are derived in large part from the available data. Always take into account that the treatment will also be dependent on availability  of resources and existing treatment guidelines, considered by other Pain Management Practitioners as being common knowledge and practice, at the time of the intervention. For Medico-Legal purposes, it is also important to point out that variation in procedural techniques and pharmacological choices are the acceptable norm. The indications, contraindications, technique, and results of the above procedure should only be interpreted and judged by a Board-Certified Interventional Pain Specialist with extensive familiarity and expertise in the same exact procedure and technique.

## 2023-12-23 ENCOUNTER — Inpatient Hospital Stay: Payer: PPO | Attending: Oncology | Admitting: Hospice and Palliative Medicine

## 2023-12-23 ENCOUNTER — Telehealth: Payer: Self-pay | Admitting: *Deleted

## 2023-12-23 DIAGNOSIS — Z79899 Other long term (current) drug therapy: Secondary | ICD-10-CM | POA: Insufficient documentation

## 2023-12-23 DIAGNOSIS — C9000 Multiple myeloma not having achieved remission: Secondary | ICD-10-CM

## 2023-12-23 NOTE — Addendum Note (Signed)
 Addended by: Malachy Moan on: 12/23/2023 04:31 PM   Modules accepted: Level of Service

## 2023-12-23 NOTE — Progress Notes (Addendum)
 Virtual Visit via Telephone Note  I connected with Jack [name] on 12/23/23 at  3:00 PM EDT by telephone and verified that I am speaking with the correct person using two identifiers.  Location: Patient: Home Provider: Clinic   I discussed the limitations, risks, security and privacy concerns of performing an evaluation and management service by telephone and the availability of in person appointments. I also discussed with the patient that there may be a patient responsible charge related to this service. The patient expressed understanding and agreed to proceed.   History of Present Illness: Jack [name] is a 89 y.o. male with multiple medical problems including hypertension, CKD stage IIIb, A. fib on Eliquis, anemia, and refractory multiple myeloma.  Patient has history of pathologic compression fractures with T11 kyphoplasty and ablation.  He has had chronic back pain.  Observations/Objective: Spoke with patient and wife by phone.    Patient reports that pain is overall much improved since Dr. Laban Emperor has performed several steroid injections to the spine.  Patient states that his pain is now averaging 2 out of 10.  He does occasionally have breakthrough pain for which he is now using oxycodone on average twice daily.  He is continuing 3 times daily dosing of MS Contin and gabapentin.  Patient feels that he is still fatigued but denies other symptomatic complaints or concerns.  Assessment and Plan: Chronic back pain secondary to multiple myeloma and history of recurrent/chronic compression fractures.  S/p XRT.  Continue MS Contin/oxycodone.  Patient followed by pain clinic and is status post steroid injections  Neuropathy -Continue gabapentin.   Follow Up Instructions: Follow-up telephone visit 1 to 2 months   I discussed the assessment and treatment plan with the patient. The patient was provided an opportunity to ask questions and all were answered. The patient agreed with the  plan and demonstrated an understanding of the instructions.   The patient was advised to call back or seek an in-person evaluation if the symptoms worsen or if the condition fails to improve as anticipated.  I provided 10 minutes of non-face-to-face time during this encounter.   Malachy Moan, NP

## 2023-12-23 NOTE — Telephone Encounter (Signed)
 I sent the message to tell Josh that the [facility] are waiting for you to call. I called and spoke to pt. And they will wait for the call. The wife picked up the call and kept saying I am here, but no answer.

## 2024-01-02 ENCOUNTER — Ambulatory Visit: Admitting: Pharmacist

## 2024-01-02 ENCOUNTER — Other Ambulatory Visit: Payer: Self-pay | Admitting: *Deleted

## 2024-01-02 ENCOUNTER — Encounter: Payer: Self-pay | Admitting: Oncology

## 2024-01-02 ENCOUNTER — Other Ambulatory Visit

## 2024-01-02 MED ORDER — MORPHINE SULFATE ER 30 MG PO TBCR: 30.0000 mg | EXTENDED_RELEASE_TABLET | Freq: Three times a day (TID) | ORAL | 0 refills | Status: DC

## 2024-01-04 ENCOUNTER — Other Ambulatory Visit: Payer: Self-pay | Admitting: *Deleted

## 2024-01-04 DIAGNOSIS — C9000 Multiple myeloma not having achieved remission: Secondary | ICD-10-CM

## 2024-01-05 ENCOUNTER — Other Ambulatory Visit: Payer: Self-pay

## 2024-01-05 ENCOUNTER — Inpatient Hospital Stay: Admitting: Pharmacist

## 2024-01-05 ENCOUNTER — Inpatient Hospital Stay

## 2024-01-05 ENCOUNTER — Other Ambulatory Visit: Payer: Self-pay | Admitting: *Deleted

## 2024-01-05 ENCOUNTER — Telehealth: Payer: Self-pay | Admitting: *Deleted

## 2024-01-05 DIAGNOSIS — C9000 Multiple myeloma not having achieved remission: Secondary | ICD-10-CM

## 2024-01-05 DIAGNOSIS — Z79899 Other long term (current) drug therapy: Secondary | ICD-10-CM | POA: Diagnosis not present

## 2024-01-05 LAB — CBC WITH DIFFERENTIAL/PLATELET
Abs Immature Granulocytes: 0.04 10*3/uL (ref 0.00–0.07)
Basophils Absolute: 0 10*3/uL (ref 0.0–0.1)
Basophils Relative: 1 %
Eosinophils Absolute: 0.3 10*3/uL (ref 0.0–0.5)
Eosinophils Relative: 8 %
HCT: 39.5 % (ref 39.0–52.0)
Hemoglobin: 12.5 g/dL — ABNORMAL LOW (ref 13.0–17.0)
Immature Granulocytes: 1 %
Lymphocytes Relative: 19 %
Lymphs Abs: 0.7 10*3/uL (ref 0.7–4.0)
MCH: 33.2 pg (ref 26.0–34.0)
MCHC: 31.6 g/dL (ref 30.0–36.0)
MCV: 104.8 fL — ABNORMAL HIGH (ref 80.0–100.0)
Monocytes Absolute: 0.5 10*3/uL (ref 0.1–1.0)
Monocytes Relative: 13 %
Neutro Abs: 2.2 10*3/uL (ref 1.7–7.7)
Neutrophils Relative %: 58 %
Platelets: 75 10*3/uL — ABNORMAL LOW (ref 150–400)
RBC: 3.77 MIL/uL — ABNORMAL LOW (ref 4.22–5.81)
RDW: 16 % — ABNORMAL HIGH (ref 11.5–15.5)
WBC: 3.7 10*3/uL — ABNORMAL LOW (ref 4.0–10.5)
nRBC: 0 % (ref 0.0–0.2)

## 2024-01-05 LAB — CMP (CANCER CENTER ONLY)
ALT: 13 U/L (ref 0–44)
AST: 19 U/L (ref 15–41)
Albumin: 3.3 g/dL — ABNORMAL LOW (ref 3.5–5.0)
Alkaline Phosphatase: 81 U/L (ref 38–126)
Anion gap: 7 (ref 5–15)
BUN: 33 mg/dL — ABNORMAL HIGH (ref 8–23)
CO2: 27 mmol/L (ref 22–32)
Calcium: 8.8 mg/dL — ABNORMAL LOW (ref 8.9–10.3)
Chloride: 104 mmol/L (ref 98–111)
Creatinine: 1.38 mg/dL — ABNORMAL HIGH (ref 0.61–1.24)
GFR, Estimated: 49 mL/min — ABNORMAL LOW (ref >60)
Glucose, Bld: 114 mg/dL — ABNORMAL HIGH (ref 70–99)
Potassium: 4.1 mmol/L (ref 3.5–5.1)
Sodium: 138 mmol/L (ref 135–145)
Total Bilirubin: 0.5 mg/dL (ref 0.0–1.2)
Total Protein: 5.5 g/dL — ABNORMAL LOW (ref 6.5–8.1)

## 2024-01-05 MED ORDER — MORPHINE SULFATE ER 30 MG PO TBCR
30.0000 mg | EXTENDED_RELEASE_TABLET | Freq: Three times a day (TID) | ORAL | 0 refills | Status: DC
  Filled 2024-01-05: qty 90, 30d supply, fill #0

## 2024-01-05 MED ORDER — MORPHINE SULFATE ER 15 MG PO TBCR
30.0000 mg | EXTENDED_RELEASE_TABLET | Freq: Three times a day (TID) | ORAL | 0 refills | Status: DC
  Filled 2024-01-05: qty 180, 30d supply, fill #0

## 2024-01-05 NOTE — Telephone Encounter (Signed)
 The wife called to say that the pharmacy does not have 30 mg ms contin  and they do have 15 mg  that they can use but will need to change it to 15 mg tablet and take 2 at a time. They are coming in today and they willt alk to you if you do not get the message before they get here

## 2024-01-05 NOTE — Progress Notes (Signed)
 Oral Chemotherapy Clinic [facility]  [phone] [phone]  Patient Care Team: Rory Collard, MD as PCP - General (Family Medicine) End, Veryl Gottron, MD as PCP - Cardiology (Cardiology) Randye Buttner, MD (Endocrinology) Shellie Dials, MD as Consulting Physician (Hematology and Oncology) Holland Lundborg, RN as [facility] Care Management   Name of the patient: Jack [name]  [id number]  1933-02-09   Date of visit: 01/05/24  HPI: Patient is a 89 y.o. male with newly diagnosed multiple myeloma. Currently treated with Revlimid  (lenalidomide ) and dexamethasone . Patient was initiated on an all oral regimen because he was unable to physically make it into clinic at the time treatment due to his disease/performance status. His status improved he was able to come back to inperson appts on 07/14/21. Lenalidomide  was dose reduced to 15 mg daily 21on/7off on 11/07/23 due to renal function.   Reason for Consult: Oral chemotherapy follow-up for lenalidomide  therapy.   PAST MEDICAL HISTORY: Past Medical History:  Diagnosis Date   Ascending aortic aneurysm (HCC)    a. 12/2020 Echo: Asc Ao 48mm, Ao root 40mm. b. 03/2022 Asc Ao 4.6 cm (4.5 cm in 2019)   CKD (chronic kidney disease), stage III (HCC)    Compression fracture of body of thoracic vertebra (HCC)    Diastolic dysfunction    a. 12/2020 Echo: EF 50-55%, no rwma, mild LVH, GrI DD, nl RV fxn, mild AI. Asc Ao 48mm, Ao root 40mm.   Elevated prostate specific antigen (PSA)    has been 7 for a year    GERD (gastroesophageal reflux disease)    History of colon polyps 2008   [facility],    History of kidney stones    Hyperlipidemia    Hypertension    Myeloma (HCC)    PAF (paroxysmal atrial fibrillation) (HCC)    a. 01/2021-->Eliquis  (CHA2DS2VASc = 3-4).   Pain    Prostate hypertrophy    diagnosed at age 35 due to hematospermia    HEMATOLOGY/ONCOLOGY HISTORY:  Oncology  History  Kappa light chain myeloma (HCC)  03/12/2021 Initial Diagnosis   Kappa light chain myeloma (HCC)   03/27/2021 - 04/06/2021 Chemotherapy   Patient is on Treatment Plan : MYELOMA NON-TRANSPLANT CANDIDATES VRd SQ q21d      04/16/2021 Cancer Staging   Staging form: Plasma Cell Myeloma and Plasma Cell Disorders, AJCC 8th Edition - Clinical stage from 04/16/2021: Albumin (g/dL): 3.8, ISS: Stage II, High-risk cytogenetics: Absent, LDH: Unknown - Signed by Shellie Dials, MD on 04/16/2021 Albumin range (g/dL): Greater than or equal to 3.5 Cytogenetics: t(11;14) translocation Serum calcium  level: Normal Serum creatinine level: Normal Bone disease on imaging: Present     ALLERGIES:  is allergic to quinolones, amlodipine , azithromycin , lipitor [atorvastatin ], lisinopril , zetia  [ezetimibe ], and hydromorphone  hcl.  MEDICATIONS:  Current Outpatient Medications  Medication Sig Dispense Refill   Alpha-Lipoic Acid 600 MG CAPS Take by mouth.     Ascorbic Acid  (VITAMIN C) 1000 MG tablet Take 1,000 mg by mouth in the morning and at bedtime.     Calcium  Carb-Cholecalciferol  (OYSTER SHELL CALCIUM  W/D) 500-5 MG-MCG TABS Take 1 tablet by mouth in the morning and at bedtime.     carvedilol  (COREG ) 12.5 MG tablet Take 0.5 tablets (6.25 mg total) by mouth 2 (two) times daily. 180 tablet 3   cyanocobalamin  (VITAMIN B12) 1000 MCG tablet Take 1,000 mcg by mouth daily.     dexamethasone  (DECADRON ) 4 MG tablet Take 5 tablets (20 mg total) by mouth  once a week. 60 tablet 2   ELIQUIS  5 MG TABS tablet TAKE 1 TABLET BY MOUTH TWICE A DAY 180 tablet 0   gabapentin  (NEURONTIN ) 300 MG capsule Take 2 capsules (600 mg total) by mouth 3 (three) times daily. 180 capsule 2   ibuprofen  (ADVIL ) 200 MG tablet Take 200 mg by mouth 2 (two) times daily as needed.     lenalidomide  (REVLIMID ) 15 MG capsule Take 1 capsule by mouth once daily for 21 days, then 7 days off. 21 capsule 0   lidocaine  (XYLOCAINE ) 5 % ointment APPLY  TOPICALLY 3 (THREE) TIMES DAILY AS NEEDED FOR MILD PAIN OR MODERATE PAIN. 50 g 2   morphine  (MS CONTIN ) 15 MG 12 hr tablet Take 2 tablets (30 mg total) by mouth every 8 (eight) hours. 180 tablet 0   morphine  (MS CONTIN ) 30 MG 12 hr tablet Take 1 tablet (30 mg total) by mouth every 8 (eight) hours. 90 tablet 0   Multiple Vitamin (MULTIVITAMIN WITH MINERALS) TABS tablet Take 1 tablet by mouth daily.     Multiple Vitamins-Minerals (PRESERVISION AREDS PO) Take 1 capsule by mouth in the morning and at bedtime.     naloxone  (NARCAN ) nasal spray 4 mg/0.1 mL SPRAY 1 SPRAY INTO ONE NOSTRIL AS DIRECTED FOR OPIOID OVERDOSE (TURN PERSON ON SIDE AFTER DOSE. IF NO RESPONSE IN 2-3 MINUTES OR PERSON RESPONDS BUT RELAPSES, REPEAT USING A NEW SPRAY DEVICE AND SPRAY INTO THE OTHER NOSTRIL. CALL 911 AFTER USE.) * EMERGENCY USE ONLY * 1 each 0   omeprazole  (PRILOSEC) 20 MG capsule TAKE 1 CAPSULE BY MOUTH EVERY DAY 90 capsule 2   Oxycodone  HCl 10 MG TABS Take 1 tablet (10 mg total) by mouth every 4 (four) hours as needed (pain). 90 tablet 0   polyethylene glycol (MIRALAX  / GLYCOLAX ) 17 g packet Take 17 g by mouth 2 (two) times daily.  0   potassium citrate  (UROCIT-K ) 10 MEQ (1080 MG) SR tablet TAKE 1 TABLET (10 MEQ TOTAL) BY MOUTH 3 (THREE) TIMES DAILY WITH MEALS. 300 tablet 2   senna-docusate (SENOKOT-S) 8.6-50 MG tablet Take 1 tablet by mouth 2 (two) times daily. 30 tablet 0   No current facility-administered medications for this visit.    VITAL SIGNS: There were no vitals taken for this visit. There were no vitals filed for this visit.   Estimated body mass index is 26.63 kg/m as calculated from the following:   Height as of 12/22/23: 5\' 7"  (1.702 m).   Weight as of 12/22/23: 77.1 kg (170 lb).  LABS: CBC:    Component Value Date/Time   WBC 3.7 (L) 01/05/2024 1406   HGB 12.5 (L) 01/05/2024 1406   HGB 13.1 12/05/2023 1409   HGB 11.0 (L) 02/24/2021 1014   HCT 39.5 01/05/2024 1406   HCT 35.3 (L) 02/24/2021  1014   PLT 75 (L) 01/05/2024 1406   PLT 71 (L) 12/05/2023 1409   PLT 249 02/24/2021 1014   MCV 104.8 (H) 01/05/2024 1406   MCV 96 02/24/2021 1014   MCV 90 01/24/2013 0819   NEUTROABS 2.2 01/05/2024 1406   NEUTROABS 7.4 (H) 01/24/2013 0819   LYMPHSABS 0.7 01/05/2024 1406   LYMPHSABS 1.2 01/24/2013 0819   MONOABS 0.5 01/05/2024 1406   MONOABS 0.3 01/24/2013 0819   EOSABS 0.3 01/05/2024 1406   EOSABS 0.0 01/24/2013 0819   BASOSABS 0.0 01/05/2024 1406   BASOSABS 0.0 01/24/2013 0819   Comprehensive Metabolic Panel:    Component Value Date/Time  NA 138 01/05/2024 1406   NA 136 02/24/2021 1014   NA 138 01/24/2013 0819   K 4.1 01/05/2024 1406   K 3.8 01/24/2013 0819   CL 104 01/05/2024 1406   CL 105 01/24/2013 0819   CO2 27 01/05/2024 1406   CO2 25 01/24/2013 0819   BUN 33 (H) 01/05/2024 1406   BUN 23 02/24/2021 1014   BUN 20 (H) 01/24/2013 0819   CREATININE 1.38 (H) 01/05/2024 1406   CREATININE 1.52 (H) 01/24/2013 0819   GLUCOSE 114 (H) 01/05/2024 1406   GLUCOSE 171 (H) 01/24/2013 0819   CALCIUM  8.8 (L) 01/05/2024 1406   CALCIUM  10.4 (H) 02/19/2021 1229   AST 19 01/05/2024 1406   ALT 13 01/05/2024 1406   ALT 15 01/24/2013 0819   ALKPHOS 81 01/05/2024 1406   ALKPHOS 74 01/24/2013 0819   BILITOT 0.5 01/05/2024 1406   PROT 5.5 (L) 01/05/2024 1406   PROT 6.2 02/24/2021 1014   PROT 6.3 (L) 01/24/2013 0819   ALBUMIN 3.3 (L) 01/05/2024 1406   ALBUMIN 4.5 02/24/2021 1014   ALBUMIN 3.6 01/24/2013 0819     Present during today's visit: Patient and his wife Jack [name]  Assessment and Plan: CBC/CMP reviewed with the Jack [name], continue lenalidomide  15 mg PO daily, 21 days on/7 off and dexamethasone  20 mg weekly.  Light chains pending will continue to monitor trending in kappa Overall patient is doing well, they have been been busy late and even travelled to the beach. Mr. Sterbenz has been able to keep up during all of this and rest when needed.    Oral Chemotherapy Adherence: no  reported missed doses.  No patient barriers to medication adherence identified.   New medications: none reported  Medication Access Issues: no issues, fills Revlimid  at [facility]  Patient expressed understanding and was in agreement with this plan. He also understands that He can call clinic at any time with any questions, concerns, or complaints.   Follow-up plan: RTC in 4 weeks  Thank you for allowing me to participate in the care of this very pleasant patient.   Time Total: 15 mins  Visit consisted of counseling and education on dealing with issues of symptom management in the setting of serious and potentially life-threatening illness.Greater than 50%  of this time was spent counseling and coordinating care related to the above assessment and plan.  Signed by: Klye Besecker N. Trinidy Masterson, PharmD, Lorraine Roses, CPP Hematology/Oncology Clinical Pharmacist Practitioner /[facility] [phone]  01/05/2024 2:54 PM

## 2024-01-06 LAB — KAPPA/LAMBDA LIGHT CHAINS
Kappa free light chain: 86.3 mg/L — ABNORMAL HIGH (ref 3.3–19.4)
Kappa, lambda light chain ratio: 6.64 — ABNORMAL HIGH (ref 0.26–1.65)
Lambda free light chains: 13 mg/L (ref 5.7–26.3)

## 2024-01-09 ENCOUNTER — Other Ambulatory Visit: Payer: Self-pay | Admitting: Oncology

## 2024-01-09 ENCOUNTER — Telehealth: Payer: Self-pay | Admitting: *Deleted

## 2024-01-09 ENCOUNTER — Other Ambulatory Visit: Payer: Self-pay | Admitting: *Deleted

## 2024-01-09 DIAGNOSIS — Z0000 Encounter for general adult medical examination without abnormal findings: Secondary | ICD-10-CM | POA: Diagnosis not present

## 2024-01-09 DIAGNOSIS — I4891 Unspecified atrial fibrillation: Secondary | ICD-10-CM | POA: Diagnosis not present

## 2024-01-09 DIAGNOSIS — C7951 Secondary malignant neoplasm of bone: Secondary | ICD-10-CM | POA: Diagnosis not present

## 2024-01-09 DIAGNOSIS — C9000 Multiple myeloma not having achieved remission: Secondary | ICD-10-CM

## 2024-01-09 DIAGNOSIS — N184 Chronic kidney disease, stage 4 (severe): Secondary | ICD-10-CM | POA: Diagnosis not present

## 2024-01-09 DIAGNOSIS — C7952 Secondary malignant neoplasm of bone marrow: Secondary | ICD-10-CM | POA: Diagnosis not present

## 2024-01-09 MED ORDER — LENALIDOMIDE 15 MG PO CAPS: 15.0000 mg | ORAL_CAPSULE | Freq: Every day | ORAL | 0 refills | Status: DC

## 2024-01-09 NOTE — Telephone Encounter (Signed)
 Kim with [facility] needs refill for revlimid  15 mg 21 days and 7 days off

## 2024-01-10 ENCOUNTER — Ambulatory Visit: Admitting: Pain Medicine

## 2024-01-10 DIAGNOSIS — H353231 Exudative age-related macular degeneration, bilateral, with active choroidal neovascularization: Secondary | ICD-10-CM | POA: Diagnosis not present

## 2024-01-15 ENCOUNTER — Encounter: Payer: Self-pay | Admitting: Oncology

## 2024-01-16 ENCOUNTER — Other Ambulatory Visit: Payer: Self-pay | Admitting: *Deleted

## 2024-01-16 MED ORDER — OXYCODONE HCL 10 MG PO TABS: 10.0000 mg | ORAL_TABLET | ORAL | 0 refills | Status: DC | PRN

## 2024-01-17 ENCOUNTER — Ambulatory Visit: Attending: Pain Medicine | Admitting: Pain Medicine

## 2024-01-17 ENCOUNTER — Encounter: Payer: Self-pay | Admitting: Pain Medicine

## 2024-01-17 VITALS — BP 145/85 | HR 67 | Temp 97.9°F | Resp 14 | Ht 67.0 in | Wt 168.0 lb

## 2024-01-17 DIAGNOSIS — M549 Dorsalgia, unspecified: Secondary | ICD-10-CM | POA: Insufficient documentation

## 2024-01-17 DIAGNOSIS — M47814 Spondylosis without myelopathy or radiculopathy, thoracic region: Secondary | ICD-10-CM | POA: Diagnosis not present

## 2024-01-17 DIAGNOSIS — S32020S Wedge compression fracture of second lumbar vertebra, sequela: Secondary | ICD-10-CM | POA: Diagnosis not present

## 2024-01-17 DIAGNOSIS — Z9889 Other specified postprocedural states: Secondary | ICD-10-CM | POA: Insufficient documentation

## 2024-01-17 DIAGNOSIS — S32010S Wedge compression fracture of first lumbar vertebra, sequela: Secondary | ICD-10-CM | POA: Diagnosis not present

## 2024-01-17 DIAGNOSIS — C9000 Multiple myeloma not having achieved remission: Secondary | ICD-10-CM | POA: Diagnosis not present

## 2024-01-17 DIAGNOSIS — G8929 Other chronic pain: Secondary | ICD-10-CM | POA: Diagnosis not present

## 2024-01-17 DIAGNOSIS — M47894 Other spondylosis, thoracic region: Secondary | ICD-10-CM | POA: Diagnosis not present

## 2024-01-17 DIAGNOSIS — M8440XS Pathological fracture, unspecified site, sequela: Secondary | ICD-10-CM | POA: Diagnosis not present

## 2024-01-17 DIAGNOSIS — Z09 Encounter for follow-up examination after completed treatment for conditions other than malignant neoplasm: Secondary | ICD-10-CM | POA: Insufficient documentation

## 2024-01-17 DIAGNOSIS — M546 Pain in thoracic spine: Secondary | ICD-10-CM | POA: Diagnosis not present

## 2024-01-17 DIAGNOSIS — H353190 Nonexudative age-related macular degeneration, unspecified eye, stage unspecified: Secondary | ICD-10-CM | POA: Insufficient documentation

## 2024-01-17 DIAGNOSIS — M5134 Other intervertebral disc degeneration, thoracic region: Secondary | ICD-10-CM | POA: Diagnosis not present

## 2024-01-17 DIAGNOSIS — S22080S Wedge compression fracture of T11-T12 vertebra, sequela: Secondary | ICD-10-CM | POA: Diagnosis not present

## 2024-01-17 DIAGNOSIS — Z7901 Long term (current) use of anticoagulants: Secondary | ICD-10-CM | POA: Insufficient documentation

## 2024-01-17 NOTE — Patient Instructions (Addendum)
 ______________________________________________________________________    Procedure instructions  Stop blood-thinners  Do not eat or drink fluids (other than water) for 6 hours before your procedure  No water for 2 hours before your procedure  Take your blood pressure medicine with a sip of water  Arrive 30 minutes before your appointment  If sedation is planned, bring suitable driver. Teddie Favre, [place], & public transportation are NOT APPROVED)  Carefully read the "Preparing for your procedure" detailed instructions  If you have questions call us  at [phone]  Procedure appointments are for procedures only. NO medication refills or new problem evaluations.   ______________________________________________________________________      ______________________________________________________________________    Preparing for your procedure  Appointments: If you think you may not be able to keep your appointment, call 24-48 hours in advance to cancel. We need time to make it available to others.  Procedure visits are for procedures only. During your procedure appointment there will be: NO Prescription Refills*. NO medication changes or discussions*. NO discussion of disability issues*. NO unrelated pain problem evaluations*. NO evaluations to order other pain procedures*. *These will be addressed at a separate and distinct evaluation encounter on the provider's evaluation schedule and not during procedure days.  Instructions: Food intake: Avoid eating anything solid for at least 8 hours prior to your procedure. Clear liquid intake: You may take clear liquids such as water up to 2 hours prior to your procedure. (No carbonated drinks. No soda.) Transportation: Unless otherwise stated by your physician, bring a driver. (Driver cannot be a Market researcher, Pharmacist, community, or any other form of public transportation.) Morning Medicines: Except for blood thinners, take all of your other morning  medications with a sip of water. Make sure to take your heart and blood pressure medicines. If your blood pressure's lower number is above 100, the case will be rescheduled. Blood thinners: Make sure to stop your blood thinners as instructed.  If you take a blood thinner, but were not instructed to stop it, call our office [phone] and ask to talk to a nurse. Not stopping a blood thinner prior to certain procedures could lead to serious complications. Diabetics on insulin : Notify the staff so that you can be scheduled 1st case in the morning. If your diabetes requires high dose insulin , take only  of your normal insulin  dose the morning of the procedure and notify the staff that you have done so. Preventing infections: Shower with an antibacterial soap the morning of your procedure.  Build-up your immune system: Take 1000 mg of Vitamin C with every meal (3 times a day) the day prior to your procedure. Antibiotics: Inform the nursing staff if you are taking any antibiotics or if you have any conditions that may require antibiotics prior to procedures. (Example: recent joint implants)   Pregnancy: If you are pregnant make sure to notify the nursing staff. Not doing so may result in injury to the fetus, including death.  Sickness: If you have a cold, fever, or any active infections, call and cancel or reschedule your procedure. Receiving steroids while having an infection may result in complications. Arrival: You must be in the facility at least 30 minutes prior to your scheduled procedure. Tardiness: Your scheduled time is also the cutoff time. If you do not arrive at least 15 minutes prior to your procedure, you will be rescheduled.  Children: Do not bring any children with you. Make arrangements to keep them home. Dress appropriately: There is always a possibility that your clothing may get  soiled. Avoid long dresses. Valuables: Do not bring any jewelry or valuables.  Reasons to call and  reschedule or cancel your procedure: (Following these recommendations will minimize the risk of a serious complication.) Surgeries: Avoid having procedures within 2 weeks of any surgery. (Avoid for 2 weeks before or after any surgery). Flu Shots: Avoid having procedures within 2 weeks of a flu shots or . (Avoid for 2 weeks before or after immunizations). Barium: Avoid having a procedure within 7-10 days after having had a radiological study involving the use of radiological contrast. (Myelograms, Barium swallow or enema study). Heart attacks: Avoid any elective procedures or surgeries for the initial 6 months after a "Myocardial Infarction" (Heart Attack). Blood thinners: It is imperative that you stop these medications before procedures. Let us  know if you if you take any blood thinner.  Infection: Avoid procedures during or within two weeks of an infection (including chest colds or gastrointestinal problems). Symptoms associated with infections include: Localized redness, fever, chills, night sweats or profuse sweating, burning sensation when voiding, cough, congestion, stuffiness, runny nose, sore throat, diarrhea, nausea, vomiting, cold or Flu symptoms, recent or current infections. It is specially important if the infection is over the area that we intend to treat. Heart and lung problems: Symptoms that may suggest an active cardiopulmonary problem include: cough, chest pain, breathing difficulties or shortness of breath, dizziness, ankle swelling, uncontrolled high or unusually low blood pressure, and/or palpitations. If you are experiencing any of these symptoms, cancel your procedure and contact your primary care physician for an evaluation.  Remember:  Regular Business hours are:  Monday to Thursday 8:00 AM to 4:00 PM  Provider's Schedule: Renaldo Caroli, MD:  Procedure days: Tuesday and Thursday 7:30 AM to 4:00 PM  Cephus Collin, MD:  Procedure days: Monday and Wednesday 7:30 AM to 4:00  PM Last  Updated: 08/23/2023 ______________________________________________________________________      ______________________________________________________________________    General Risks and Possible Complications  Patient Responsibilities: It is important that you read this as it is part of your informed consent. It is our duty to inform you of the risks and possible complications associated with treatments offered to you. It is your responsibility as a patient to read this and to ask questions about anything that is not clear or that you believe was not covered in this document.  Patient's Rights: You have the right to refuse treatment. You also have the right to change your mind, even after initially having agreed to have the treatment done. However, under this last option, if you wait until the last second to change your mind, you may be charged for the materials used up to that point.  Introduction: Medicine is not an Visual merchandiser. Everything in Medicine, including the lack of treatment(s), carries the potential for danger, harm, or loss (which is by definition: Risk). In Medicine, a complication is a secondary problem, condition, or disease that can aggravate an already existing one. All treatments carry the risk of possible complications. The fact that a side effects or complications occurs, does not imply that the treatment was conducted incorrectly. It must be clearly understood that these can happen even when everything is done following the highest safety standards.  No treatment: You can choose not to proceed with the proposed treatment alternative. The "PRO(s)" would include: avoiding the risk of complications associated with the therapy. The "CON(s)" would include: not getting any of the treatment benefits. These benefits fall under one of three categories: diagnostic; therapeutic; and/or  palliative. Diagnostic benefits include: getting information which can ultimately lead to  improvement of the disease or symptom(s). Therapeutic benefits are those associated with the successful treatment of the disease. Finally, palliative benefits are those related to the decrease of the primary symptoms, without necessarily curing the condition (example: decreasing the pain from a flare-up of a chronic condition, such as incurable terminal cancer).  General Risks and Complications: These are associated to most interventional treatments. They can occur alone, or in combination. They fall under one of the following six (6) categories: no benefit or worsening of symptoms; bleeding; infection; nerve damage; allergic reactions; and/or death. No benefits or worsening of symptoms: In Medicine there are no guarantees, only probabilities. No healthcare provider can ever guarantee that a medical treatment will work, they can only state the probability that it may. Furthermore, there is always the possibility that the condition may worsen, either directly, or indirectly, as a consequence of the treatment. Bleeding: This is more common if the patient is taking a blood thinner, either prescription or over the counter (example: Goody Powders, Fish oil, Aspirin, Garlic, etc.), or if suffering a condition associated with impaired coagulation (example: Hemophilia, cirrhosis of the liver, low platelet counts, etc.). However, even if you do not have one on these, it can still happen. If you have any of these conditions, or take one of these drugs, make sure to notify your treating physician. Infection: This is more common in patients with a compromised immune system, either due to disease (example: diabetes, cancer, human immunodeficiency virus [HIV], etc.), or due to medications or treatments (example: therapies used to treat cancer and rheumatological diseases). However, even if you do not have one on these, it can still happen. If you have any of these conditions, or take one of these drugs, make sure to notify  your treating physician. Nerve Damage: This is more common when the treatment is an invasive one, but it can also happen with the use of medications, such as those used in the treatment of cancer. The damage can occur to small secondary nerves, or to large primary ones, such as those in the spinal cord and brain. This damage may be temporary or permanent and it may lead to impairments that can range from temporary numbness to permanent paralysis and/or brain death. Allergic Reactions: Any time a substance or material comes in contact with our body, there is the possibility of an allergic reaction. These can range from a mild skin rash (contact dermatitis) to a severe systemic reaction (anaphylactic reaction), which can result in death. Death: In general, any medical intervention can result in death, most of the time due to an unforeseen complication. ______________________________________________________________________      ______________________________________________________________________    Blood Thinners  IMPORTANT NOTICE:  If you take any of these, make sure to notify the nursing staff.  Failure to do so may result in serious injury.  Recommended time intervals to stop and restart blood-thinners, before & after invasive procedures  Generic Name Brand Name Pre-procedure: Stop medication for this amount of time before your procedure: Post-procedure: Wait this amount of time after the procedure before restarting your medication:  Abciximab Reopro 15 days 2 hrs  Alteplase Activase 10 days 10 days  Anagrelide Agrylin    Apixaban  Eliquis  3 days 6 hrs  Cilostazol Pletal 3 days 5 hrs  Clopidogrel Plavix 7-10 days 2 hrs  Dabigatran Pradaxa 5 days 6 hrs  Dalteparin Fragmin 24 hours 4 hrs  Dipyridamole Aggrenox 11days 2  hrs  Edoxaban Lixiana; Savaysa 3 days 2 hrs  Enoxaparin   Lovenox  24 hours 4 hrs  Eptifibatide Integrillin 8 hours 2 hrs  Fondaparinux  Arixtra 72 hours 12 hrs   Hydroxychloroquine Plaquenil 11 days   Prasugrel Effient 7-10 days 6 hrs  Reteplase Retavase 10 days 10 days  Rivaroxaban Xarelto 3 days 6 hrs  Ticagrelor Brilinta 5-7 days 6 hrs  Ticlopidine Ticlid 10-14 days 2 hrs  Tinzaparin Innohep 24 hours 4 hrs  Tirofiban Aggrastat 8 hours 2 hrs  Warfarin Coumadin 5 days 2 hrs   Other medications with blood-thinning effects  NOTE: Consider stopping these if you have prolonged bleeding despite not taking any of the above blood thinners. Otherwise ask your provider and this will be decided on a case-by-case basis.  Product indications Generic (Brand) names Note  Cholesterol Lipitor Stop 4 days before procedure  Blood thinner (injectable) Heparin  (LMW or LMWH Heparin ) Stop 24 hours before procedure  Cancer Ibrutinib (Imbruvica) Stop 7 days before procedure  Malaria/Rheumatoid Hydroxychloroquine (Plaquenil) Stop 11 days before procedure  Thrombolytics  10 days before or after procedures   Over-the-counter (OTC) Products with blood-thinning effects  NOTE: Consider stopping these if you have prolonged bleeding despite not taking any of the above blood thinners. Otherwise ask your provider and this will be decided on a case-by-case basis.  Product Common names Stop Time  Aspirin > 325 mg Goody Powders, Excedrin, etc. 11 days  Aspirin <= 81 mg  7 days  Fish oil  4 days  Garlic supplements  7 days  Ginkgo biloba  36 hours  Ginseng  24 hours  NSAIDs Ibuprofen , Naprosyn, etc. 3 days  Vitamin E  4 days   ______________________________________________________________________    Stop Eliquis  3 days

## 2024-01-17 NOTE — Progress Notes (Unsigned)
 PROVIDER NOTE: Interpretation of information contained herein should be left to medically-trained personnel. Specific patient instructions are provided elsewhere under "Patient Instructions" section of medical record. This document was created in part using AI and STT-dictation technology, any transcriptional errors that may result from this process are unintentional.  Patient: Jack [name]  Service: E/M   PCP: Rory Collard, MD  DOB: June 08, 1933  DOS: 01/17/2024  Provider: Candi Chafe, MD  MRN: [id number]  Delivery: Face-to-face  Specialty: Interventional Pain Management  Type: Established Patient  Setting: Ambulatory outpatient facility  Specialty designation: 09  Referring Prov.: Rory Collard, MD  Location: Outpatient office facility       HPI  Jack [name], a 89 y.o. year old male, is here today because of his Thoracic facet syndrome [M47.894]. [name] primary complain today is Back Pain (Upper )  Pertinent problems: [name] has Compression fracture of T12 vertebra, sequela; Compression fracture of L1 lumbar vertebra, sequela; Numbness of foot; Sciatica; Sensory polyneuropathy; Shoulder pain; Multilevel spine pain; Compression fracture of L2 lumbar vertebra, sequela; Kappa light chain myeloma (HCC); Right flank pain; Compression fracture of thoracic vertebra, sequela; Multiple myeloma (HCC); Pelvic fracture (HCC); Collapsed vertebra, not elsewhere classified, thoracic region, subsequent encounter for fracture with routine healing; Muscle weakness; Statin myopathy; Unsteadiness on feet; Chronic pain syndrome; History of vertebral fracture; Compression fracture of L3 lumbar vertebra, sequela; Compression fracture of T11 vertebra, sequela; Pathologic lumbar vertebral fracture, sequela; Pathologic fracture of thoracic vertebrae, sequela; History of multiple myeloma; Chronic low back pain (1ry area of Pain) (Bilateral) (R>L) w/o sciatica; History of kyphoplasty (T11, T12, L1, L2,  and L3); Grade 1 (3mm) Anterolisthesis of cervical spine (C4/C5, C5/C6, C6/C7); Cancer related pain; Compression fracture of lumbar vertebra, sequela; Multiple pathological fractures, sequela; Thoracic spine pain; Lumbar spine pain; Abnormal MRI, lumbar spine (11/16/2023); Abnormal MRI, thoracic spine (11/16/2023); Collapse of thoracic vertebra (HCC); Wedge fracture of thoracic vertebra (HCC); Lumbar facet arthropathy; Lumbosacral facet arthropathy; Lumbar facet joint pain; Osteoarthritis of facet joint of lumbar spine; Thoracic facet syndrome; DDD (degenerative disc disease), thoracic; DJD (degenerative joint disease), thoracic; Thoracic facet arthropathy; Chronic thoracic back pain (Bilateral); 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 Pain Assessment: Severity of Chronic pain is reported as a 4 /10. Location: Back Upper/denies. Onset: More than a month ago. Quality: Constant. Timing: Constant. Modifying factor(s): nothing. Vitals:  height is 5\' 7"  (1.702 m) and weight is 168 lb (76.2 kg). His temporal temperature is 97.9 F (36.6 C). His blood pressure is 145/85 (abnormal) and his pulse is 67. His respiration is 14 and oxygen saturation is 95%.  BMI: Estimated body mass index is 26.31 kg/m as calculated from the following:   Height as of this encounter: 5\' 7"  (1.702 m).   Weight as of this encounter: 168 lb (76.2 kg). Last encounter: 12/13/2023. Last procedure: 12/22/2023.  Reason for encounter: post-procedure evaluation and assessment.   Discussed the use of AI scribe software for clinical note transcription with the patient, who gave verbal consent to proceed.  History of Present Illness   Jack [name] is an 89 year old male with multiple compression fractures who presents for a post-procedure evaluation after a bilateral thoracolumbar facet block.  He returns  for evaluation following a bilateral thoracolumbar facet block at the T11-12, T12-L1, and L1-2 levels. He experienced no short-term relief of pain post-procedure, with only a 50%  improvement after the numbing medicine wore off. Initially, he had increased pain and discomfort for about four days post-procedure, during which he was very inactive. Subsequently, he noted some improvement lasting about ten days.  His medical history includes multiple compression fractures at T11, T12, L1, L2, and L3 levels. He previously underwent a T12-L1 midline epidural steroid injection on November 22, 2023, which provided 100% pain relief for the first hour, followed by 50% improvement for four to six hours, and 80% improvement lasting two and a half weeks. He has also undergone kyphoplasty three times to address these fractures.  He has chronic back pain, which he rates as a four on a scale of ten, although it can increase to a five. The pain is localized to his back and does not radiate to the sides or front. He experiences significant fatigue, struggles with daily activities, and has a decreased appetite. He takes morphine  and oxycodone  on a regular schedule as prescribed by his cancer doctors, and supplements with ibuprofen  for breakthrough pain.  No pain, numbness, or weakness radiating down his legs, indicating no sciatica-like symptoms. His caregiver notes that he has been more active and felt better after the first epidural steroid injection compared to the recent facet block.      Post-procedure evaluation   Procedure: Thoracic facet medial branch block #1  Laterality: Bilateral (-50)  Level: T10, T11, T12, and    L1    Medial Branch Level(s) (T11-12, T12-L1, L1-2) Imaging: Fluoroscopy-guided Spinal ([id number]) Anesthesia: Local anesthesia (1-2% Lidocaine ) Anxiolysis: IV Versed  1.0 mg Sedation: No Sedation                       DOS: 12/22/2023  Performed by: Candi Chafe, MD  Purpose:  Diagnostic/Therapeutic Indications: Thoracic back pain severe enough to impact quality of life or function. Rationale (medical necessity): procedure needed and proper for the diagnosis and/or treatment of Jack [name] medical symptoms and needs. 1. Chronic low back pain (1ry area of Pain) (Bilateral) (R>L) w/o sciatica   2. Lumbar facet joint pain   3. Lumbar spine pain   4. Multilevel spine pain   5. Compression fracture of T11 vertebra, sequela   6. Compression fracture of T12 vertebra, sequela   7. Compression fracture of L1 lumbar vertebra, sequela   8. Compression fracture of L2 lumbar vertebra, sequela   9. Compression fracture of L3 lumbar vertebra, sequela   10. Compression fracture of lumbar vertebra, sequela   11. History of kyphoplasty (T11, T12, L1, L2, and L3)   12. Abnormal MRI, lumbar spine (11/16/2023)   13. Chronic anticoagulation (Eliquis )    NAS-11 Pain score:   Pre-procedure: 5 /10   Post-procedure: 3 /10 (Pain only on right side)   Effectiveness:  Initial hour after procedure: 80 %. Subsequent 4-6 hours post-procedure: 80 %. Analgesia past initial 6 hours: 50 %. Ongoing improvement:  Analgesic: Initially the patient indicated not having attained any relief of the pain for the duration of the local anesthetic but further questioning revealed that he had improvement that went away once the local anesthetic wore off he also confirmed the improvement not to have been as effective as with the epidural steroid injection and having lasted last time. Function: Minimal improvement ROM: Back to baseline   Pharmacotherapy Assessment  Analgesic: No chronic opioid analgesics therapy prescribed by our practice. oxycodone  IR 10 mg tablet, 2 tabs p.o. 3 times daily (# 90) (last filled on 03/11/2023) (90 MME); morphine   ER 30 mg tablet, 1 tab p.o. 3 times daily (# 90) (last filled on 03/24/2023) (90 MME) by Jack Dials, MD (Oncology)  MME/day: 180 mg/day   Monitoring: Jack [name]  PMP: PDMP reviewed during this encounter.       Pharmacotherapy: No side-effects or adverse reactions reported. Compliance: No problems identified. Effectiveness: Clinically acceptable.  No notes on file  No results found for: "CBDTHCR" No results found for: "D8THCCBX" No results found for: "D9THCCBX"  UDS:  No results found for: "SUMMARY"    ROS  Constitutional: Denies any fever or chills Gastrointestinal: No reported hemesis, hematochezia, vomiting, or acute GI distress Musculoskeletal: Denies any acute onset joint swelling, redness, loss of ROM, or weakness Neurological: No reported episodes of acute onset apraxia, aphasia, dysarthria, agnosia, amnesia, paralysis, loss of coordination, or loss of consciousness  Medication Review  Alpha-Lipoic Acid, Multiple Vitamins-Minerals, Oxycodone  HCl, Oyster Shell Calcium  w/D, apixaban , carvedilol , cyanocobalamin , dexamethasone , gabapentin , ibuprofen , lenalidomide , lidocaine , morphine , multivitamin with minerals, naloxone , omeprazole , polyethylene glycol, potassium citrate , senna-docusate, and vitamin C  History Review  Allergy: [name] is allergic to quinolones, amlodipine , azithromycin , lipitor [atorvastatin ], lisinopril , zetia  [ezetimibe ], and hydromorphone  hcl. Drug: [name]  reports no history of drug use. Alcohol:  reports that he does not currently use alcohol after a past usage of about 1.0 standard drink of alcohol per week. Tobacco:  reports that he quit smoking about 58 years ago. His smoking use included cigarettes. He uses smokeless tobacco. Social: [name]  reports that he quit smoking about 58 years ago. His smoking use included cigarettes. He uses smokeless tobacco. He reports that he does not currently use alcohol after a past usage of about 1.0 standard drink of alcohol per week. He reports that he does not use drugs. Medical:  has a past medical history of Ascending aortic aneurysm (HCC), CKD (chronic kidney disease),  stage III (HCC), Compression fracture of body of thoracic vertebra (HCC), Diastolic dysfunction, Elevated prostate specific antigen (PSA), GERD (gastroesophageal reflux disease), History of colon polyps (2008), History of kidney stones, Hyperlipidemia, Hypertension, Myeloma (HCC), PAF (paroxysmal atrial fibrillation) (HCC), Pain, and Prostate hypertrophy. Surgical: [name]  has a past surgical history that includes Resection soft tissue tumor leg / ankle radical (jan 2009); Small intestine surgery (1946); Cystoscopy w/ ureteral stent placement (Right, 10/16/2017); Kidney stone surgery; Colon surgery; Tonsillectomy; Cataract extraction w/PHACO (Left, 01/10/2018); Cataract extraction w/PHACO (Right, 01/25/2018); Extracorporeal shock wave lithotripsy (Right, 12/11/2020); Cystoscopy/ureteroscopy/holmium laser/stent placement (Right, 12/16/2020); IR KYPHO LUMBAR INC FX REDUCE BONE BX UNI/BIL CANNULATION INC/IMAGING (02/02/2021); IR KYPHO EA ADDL LEVEL THORACIC OR LUMBAR (02/02/2021); Kyphoplasty (N/A, 03/12/2021); IR KYPHO THORACIC WITH BONE BIOPSY (04/15/2022); and IR Bone Tumor(s)RF Ablation (04/16/2022). Family: family history includes Cancer in his sister; Hyperlipidemia in his father; Hypertension in his father.  Laboratory Chemistry Profile   Renal Lab Results  Component Value Date   BUN 33 (H) 01/05/2024   CREATININE 1.38 (H) 01/05/2024   LABCREA 96.68 02/03/2021   BCR 12 02/24/2021   GFR 59.90 (L) 09/24/2013   GFRAA 29 (L) 10/16/2017   GFRNONAA 49 (L) 01/05/2024    Hepatic Lab Results  Component Value Date   AST 19 01/05/2024   ALT 13 01/05/2024   ALBUMIN 3.3 (L) 01/05/2024   ALKPHOS 81 01/05/2024   LIPASE 32 03/26/2021    Electrolytes Lab Results  Component Value Date   NA 138 01/05/2024   K 4.1 01/05/2024   CL 104 01/05/2024   CALCIUM  8.8 (L) 01/05/2024   MG  2.1 04/06/2023    Bone Lab Results  Component Value Date   VD25OH 28 (L) 08/23/2013    Inflammation (CRP: Acute Phase) (ESR:  Chronic Phase) Lab Results  Component Value Date   CRP 1.5 (H) 06/21/2023   ESRSEDRATE 40 (H) 06/21/2023   LATICACIDVEN 1.8 04/03/2023         Note: Above Lab results reviewed.  Recent Imaging Review  DG PAIN CLINIC C-ARM 1-60 MIN NO REPORT Fluoro was used, but no Radiologist interpretation will be provided.  Please refer to "NOTES" tab for provider progress note. Note: Reviewed        Physical Exam  General appearance: Well nourished, well developed, and well hydrated. In no apparent acute distress Mental status: Alert, oriented x 3 (person, place, & time)       Respiratory: No evidence of acute respiratory distress Eyes: PERLA Vitals: BP (!) 145/85 (Cuff Size: Normal)   Pulse 67   Temp 97.9 F (36.6 C) (Temporal)   Resp 14   Ht 5\' 7"  (1.702 m)   Wt 168 lb (76.2 kg)   SpO2 95%   BMI 26.31 kg/m  BMI: Estimated body mass index is 26.31 kg/m as calculated from the following:   Height as of this encounter: 5\' 7"  (1.702 m).   Weight as of this encounter: 168 lb (76.2 kg). Ideal: Ideal body weight: 66.1 kg (145 lb 11.6 oz) Adjusted ideal body weight: 70.1 kg (154 lb 10.1 oz)  Assessment   Diagnosis Status  1. Thoracic facet syndrome   2. DDD (degenerative disc disease), thoracic   3. Other osteoarthritis of spine, thoracic region   4. Facet arthropathy, thoracic   5. Chronic bilateral thoracic back pain   6. 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 Pain of thoracic facet joint   8. Spondylosis without myelopathy or radiculopathy, thoracic region   9. Thoracic spine pain   10. Compression fracture of T11 vertebra, sequela   11. Compression fracture of T12 vertebra, sequela   12. Compression fracture of L1 lumbar vertebra, sequela   13. Compression fracture of L2 lumbar vertebra, sequela   14. History of kyphoplasty (T11, T12, L1, L2, and L3)   15. Postop check   16. Multilevel spine pain   17. Multiple myeloma not having achieved remission (HCC)   18.  Multiple pathological fractures, sequela   19. Chronic anticoagulation (Eliquis )    Controlled Controlled Controlled   Updated Problems: No problems updated.   Plan of Care  Problem-specific:  Assessment and Plan    Vertebral compression fractures   Vertebral compression fractures at T11, T12, L1, L2, and L3 cause back pain without radicular symptoms, indicating no nerve compression. Previous treatments included a T12L1 midline epidural steroid injection with significant relief for 2.5 weeks and a bilateral thoracolumbar facet block with 50% improvement post-anesthesia. Pain likely originates from irritation inside the spinal canal rather than facet joints. He experiences fatigue and low energy, possibly due to anemia or vitamin deficiencies, and is on a regular schedule of morphine  and oxycodone  for pain management. Administer two additional epidural steroid injections inside the spinal canal to address irritation and swelling. Consider an implant, such as a pump, for continuous medication delivery if pain persists. Encourage oral medications for pain management.  Multiple myeloma   Multiple myeloma contributes to vertebral compression fractures and associated back pain. The condition has weakened the bone, leading to fractures and subsequent pain.       Mr. Tabius Torain  [name] has a current medication list which includes the following long-term medication(s): oyster shell calcium  w/d, carvedilol , eliquis , gabapentin , and omeprazole .  Pharmacotherapy (Medications Ordered): No orders of the defined types were placed in this encounter.  Orders:  Orders Placed This Encounter  Procedures   Thoracic Epidural Injection    Standing Status:   Future    Expiration Date:   04/18/2024    Scheduling Instructions:     Level: T12-L1     Laterality: Midline     Sedation: Patient's choice.     Timeframe: ASAA    Where will this procedure be performed?:   [facility]   Nursing Instructions:     Please complete this patient's postprocedure evaluation.    Scheduling Instructions:     Please complete this patient's postprocedure evaluation.   Blood Thinner Instructions to Nursing    Always make sure patient has clearance from prescribing physician to stop blood thinners for interventional therapies. If the patient requires a Lovenox -bridge therapy, make sure arrangements are made to institute it with the assistance of the PCP.    Scheduling Instructions:     Have Jack [name] stop the Eliquis  (Apixaban ) x 3 days prior to procedure or surgery.   Follow-up plan:   Return for [facility]): (ML) T12-L1 TESI #2, (Blood Thinner Protocol).     Interventional Therapies  Risk Factors  Considerations  Comorbidities ELIQUIS  Anticoagulation (Stop: 3 days  Restart: 6 hours)  Multiple Myeloma  polypharmacy  High Dose opioid Therapy  Stage3b CKD  ascending aortic aneurysm  diastolic dysfunction  GERD  A-fib  nephrolithiasis  HTN  MV insufficiency     Planned  Pending:   Therapeutic midline T12-L1 thoracolumbar LESI #2    Under consideration:   Diagnostic/therapeutic midline T12-L1 thoracolumbar LESI #1    Completed:   None at this time   Therapeutic  Palliative (PRN) options:   None established   Completed by other providers:   None reported     Recent Visits Date Type Provider Dept  01/17/24 Office Visit Renaldo Caroli, MD [facility]  12/22/23 Procedure visit Renaldo Caroli, MD [facility]  12/13/23 Office Visit Renaldo Caroli, MD [facility]  11/22/23 Procedure visit Renaldo Caroli, MD [facility]  11/16/23 Office Visit Renaldo Caroli, MD [facility]  Showing recent visits within past 90 days and meeting all other requirements Future Appointments Date Type Provider Dept  02/02/24 Appointment Renaldo Caroli, MD [facility]  Showing future appointments within next 90 days and meeting  all other requirements  I discussed the assessment and treatment plan with the patient. The patient was provided an opportunity to ask questions and all were answered. The patient agreed with the plan and demonstrated an understanding of the instructions.  Patient advised to call back or seek an in-person evaluation if the symptoms or condition worsens.  Duration of encounter: 40 minutes.  Total time on encounter, as per AMA guidelines included both the face-to-face and non-face-to-face time personally spent by the physician and/or other qualified health care professional(s) on the day of the encounter (includes time in activities that require the physician or other qualified health care professional and does not include time in activities normally performed by clinical staff). Physician's time may include the following activities when performed: Preparing to see the patient (e.g., pre-charting review of records, searching for previously ordered imaging, lab work, and nerve conduction tests) Review of prior analgesic pharmacotherapies. Reviewing PMP Interpreting ordered tests (e.g.,  lab work, imaging, nerve conduction tests) Performing post-procedure evaluations, including interpretation of diagnostic procedures Obtaining and/or reviewing separately obtained history Performing a medically appropriate examination and/or evaluation Counseling and educating the patient/family/caregiver Ordering medications, tests, or procedures Referring and communicating with other health care professionals (when not separately reported) Documenting clinical information in the electronic or other health record Independently interpreting results (not separately reported) and communicating results to the patient/ family/caregiver Care coordination (not separately reported)  Note by: Candi Chafe, MD (TTS and AI technology used. I apologize for any typographical errors that were not detected and  corrected.) Date: 01/17/2024; Time: 6:07 AM

## 2024-01-18 ENCOUNTER — Telehealth: Payer: Self-pay | Admitting: *Deleted

## 2024-01-18 NOTE — Patient Outreach (Signed)
 Complex Care Management   Visit Note  01/18/2024  Name:  Jack [name] MRN: [id number] DOB: [birth year]  Situation: Referral received for Complex Care Management related to  chronic pain  I obtained verbal consent from Patient.  Visit completed with patient  on the phone  Background:   Past Medical History:  Diagnosis Date   Ascending aortic aneurysm (HCC)    a. 12/2020 Echo: Asc Ao 48mm, Ao root 40mm. b. 03/2022 Asc Ao 4.6 cm (4.5 cm in 2019)   CKD (chronic kidney disease), stage III (HCC)    Compression fracture of body of thoracic vertebra (HCC)    Diastolic dysfunction    a. 12/2020 Echo: EF 50-55%, no rwma, mild LVH, GrI DD, nl RV fxn, mild AI. Asc Ao 48mm, Ao root 40mm.   Elevated prostate specific antigen (PSA)    has been 7 for a year    GERD (gastroesophageal reflux disease)    History of colon polyps 2008   [facility],    History of kidney stones    Hyperlipidemia    Hypertension    Myeloma (HCC)    PAF (paroxysmal atrial fibrillation) (HCC)    a. 01/2021-->Eliquis  (CHA2DS2VASc = 3-4).   Pain    Prostate hypertrophy    diagnosed at age 23 due to hematospermia    Assessment: Patient Reported Symptoms:  Cognitive Cognitive Status: Able to follow simple commands, Alert and oriented to person, place, and time, Normal speech and language skills Cognitive/Intellectual Conditions Management [RPT]: None reported or documented in medical history or problem list   Health Maintenance Behaviors: Annual physical exam, Healthy diet Healing Pattern: Average Health Facilitated by: Pain control, Healthy diet  Neurological Neurological Review of Symptoms: No symptoms reported    HEENT HEENT Symptoms Reported: No symptoms reported      Cardiovascular Cardiovascular Symptoms Reported: No symptoms reported    Respiratory Respiratory Symptoms Reported: No symptoms reported    Endocrine Patient reports the following symptoms related to hypoglycemia or hyperglycemia : No  symptoms reported    Gastrointestinal Gastrointestinal Symptoms Reported: No symptoms reported      Genitourinary Genitourinary Symptoms Reported: No symptoms reported    Integumentary Integumentary Symptoms Reported: No symptoms reported    Musculoskeletal Musculoskelatal Symptoms Reviewed: No symptoms reported        Psychosocial Psychosocial Symptoms Reported: No symptoms reported            01/17/2024    2:30 PM  Depression screen PHQ 2/9  Decreased Interest 0  Down, Depressed, Hopeless 0  PHQ - 2 Score 0    There were no vitals filed for this visit.  Medications Reviewed Today     Reviewed by Holland Lundborg, RN (Registered Nurse) on 01/18/24 at 1615  Med List Status: <None>   Medication Order Taking? Sig Documenting Provider Last Dose Status Informant  Alpha-Lipoic Acid 600 MG CAPS [id number] No Take by mouth. [provider] Taking Active   Ascorbic Acid  (VITAMIN C) 1000 MG tablet [id number] No Take 1,000 mg by mouth in the morning and at bedtime. [provider] Taking Active Spouse/Significant Other  Calcium  Carb-Cholecalciferol  (OYSTER SHELL CALCIUM  W/D) 500-5 MG-MCG TABS [id number] No Take 1 tablet by mouth in the morning and at bedtime. [provider] Taking Active Spouse/Significant Other  carvedilol  (COREG ) 12.5 MG tablet [id number] No Take 0.5 tablets (6.25 mg total) by mouth 2 (two) times daily. Florette Hurry, NP Taking Active Spouse/Significant Other  cyanocobalamin  (VITAMIN B12) 1000 MCG tablet [id number] No  Take 1,000 mcg by mouth daily. [provider] Taking Active   dexamethasone  (DECADRON ) 4 MG tablet [id number] No Take 5 tablets (20 mg total) by mouth once a week. Shellie Dials, MD Taking Active   ELIQUIS  5 MG TABS tablet [id number] No TAKE 1 TABLET BY MOUTH TWICE A DAY Finnegan, Deadra Everts, MD Taking Active   gabapentin  (NEURONTIN ) 300 MG capsule [id number] No Take 2 capsules (600 mg total) by mouth 3  (three) times daily. Shellie Dials, MD Taking Active   ibuprofen  (ADVIL ) 200 MG tablet [id number] No Take 200 mg by mouth 2 (two) times daily as needed. [provider] Taking Active   lenalidomide  (REVLIMID ) 15 MG capsule [id number] No Take 1 capsule (15 mg total) by mouth daily. [employer] Auth # [id number]     Date Obtained 01/09/2024 Take 1 capsule by mouth once daily for 21 days, then 7 days off. Shellie Dials, MD Taking Active   lidocaine  (XYLOCAINE ) 5 % ointment [id number] No APPLY TOPICALLY 3 (THREE) TIMES DAILY AS NEEDED FOR MILD PAIN OR MODERATE PAIN. Shellie Dials, MD Taking Active   morphine  (MS CONTIN ) 15 MG 12 hr tablet [id number] No Take 2 tablets (30 mg total) by mouth every 8 (eight) hours. Shellie Dials, MD Taking Active   morphine  (MS CONTIN ) 30 MG 12 hr tablet [id number] No Take 1 tablet (30 mg total) by mouth every 8 (eight) hours. Shellie Dials, MD Taking Active   Multiple Vitamin (MULTIVITAMIN WITH MINERALS) TABS tablet [id number] No Take 1 tablet by mouth daily. Garrison Kanner, MD Taking Active Spouse/Significant Other  Multiple Vitamins-Minerals (PRESERVISION AREDS PO) [id number] No Take 1 capsule by mouth in the morning and at bedtime. [provider] Taking Active Spouse/Significant Other  naloxone  (NARCAN ) nasal spray 4 mg/0.1 mL [id number] No SPRAY 1 SPRAY INTO ONE NOSTRIL AS DIRECTED FOR OPIOID OVERDOSE (TURN PERSON ON SIDE AFTER DOSE. IF NO RESPONSE IN 2-3 MINUTES OR PERSON RESPONDS BUT RELAPSES, REPEAT USING A NEW SPRAY DEVICE AND SPRAY INTO THE OTHER NOSTRIL. CALL 911 AFTER USE.) * EMERGENCY USE ONLY * Borders, Carlene Che, NP Taking Active Spouse/Significant Other           Med Note (Barth Borne, FRANCISCO A   Wed Nov 16, 2023 12:55 PM) WARNING: [place] mandated prescription under the "STOP Act of 2017" requiring reversal medication to be available to patient when taking opioid medication capable of inducing respiratory depression and failure. DO NOT  REMOVE!  omeprazole  (PRILOSEC) 20 MG capsule [id number] No TAKE 1 CAPSULE BY MOUTH EVERY DAY End, Christopher, MD Taking Active   Oxycodone  HCl 10 MG TABS [id number] No Take 1 tablet (10 mg total) by mouth every 4 (four) hours as needed (pain). Shellie Dials, MD Taking Active   polyethylene glycol (MIRALAX  / GLYCOLAX ) 17 g packet [id number] No Take 17 g by mouth 2 (two) times daily. Garrison Kanner, MD Taking Active Spouse/Significant Other  potassium citrate  (UROCIT-K ) 10 MEQ (1080 MG) SR tablet [id number] No TAKE 1 TABLET (10 MEQ TOTAL) BY MOUTH 3 (THREE) TIMES DAILY WITH MEALS. Stoioff, Kizzie Perks, MD Taking Active   senna-docusate (SENOKOT-S) 8.6-50 MG tablet [id number] No Take 1 tablet by mouth 2 (two) times daily. Montey Apa, DO Taking Active Pharmacy Records, Spouse/Significant Other           Med Note [facility] Jeremy Monk Aug 25, 2022  4:04 PM)    Med List Note Humberto Magnus, RN 11/16/23 1346):  Revlimid  filled at [facility] 04-13-2023 Received clearance to stop Eliquis  for 3 days per Dr Adrian Alba.  kt            Recommendation:   PCP Follow-up  Follow Up Plan:   Patient has met all care management goals. Care Management case will be closed. Patient has been provided contact information should new needs arise.   Holland Lundborg, RN, MSN, CCM [facility], [facility] RN Care Coordinator Direct Dial: [phone] / Main [phone] Fax [phone] Email: [email or web address]

## 2024-01-23 ENCOUNTER — Encounter: Payer: Self-pay | Admitting: *Deleted

## 2024-01-24 DIAGNOSIS — Z859 Personal history of malignant neoplasm, unspecified: Secondary | ICD-10-CM | POA: Diagnosis not present

## 2024-01-24 DIAGNOSIS — L570 Actinic keratosis: Secondary | ICD-10-CM | POA: Diagnosis not present

## 2024-01-24 DIAGNOSIS — Z872 Personal history of diseases of the skin and subcutaneous tissue: Secondary | ICD-10-CM | POA: Diagnosis not present

## 2024-01-24 DIAGNOSIS — Z85828 Personal history of other malignant neoplasm of skin: Secondary | ICD-10-CM | POA: Diagnosis not present

## 2024-01-24 DIAGNOSIS — L578 Other skin changes due to chronic exposure to nonionizing radiation: Secondary | ICD-10-CM | POA: Diagnosis not present

## 2024-01-27 ENCOUNTER — Other Ambulatory Visit: Payer: Self-pay | Admitting: *Deleted

## 2024-01-27 DIAGNOSIS — C9000 Multiple myeloma not having achieved remission: Secondary | ICD-10-CM

## 2024-01-29 ENCOUNTER — Other Ambulatory Visit: Payer: Self-pay | Admitting: Oncology

## 2024-01-29 DIAGNOSIS — C9000 Multiple myeloma not having achieved remission: Secondary | ICD-10-CM

## 2024-01-30 ENCOUNTER — Encounter: Payer: Self-pay | Admitting: Oncology

## 2024-01-30 ENCOUNTER — Inpatient Hospital Stay: Admitting: Oncology

## 2024-01-30 ENCOUNTER — Inpatient Hospital Stay: Attending: Oncology

## 2024-01-30 VITALS — BP 149/71 | HR 62 | Temp 98.3°F | Resp 16 | Ht 67.0 in | Wt 165.4 lb

## 2024-01-30 DIAGNOSIS — Z79899 Other long term (current) drug therapy: Secondary | ICD-10-CM | POA: Diagnosis not present

## 2024-01-30 DIAGNOSIS — N289 Disorder of kidney and ureter, unspecified: Secondary | ICD-10-CM | POA: Insufficient documentation

## 2024-01-30 DIAGNOSIS — C9000 Multiple myeloma not having achieved remission: Secondary | ICD-10-CM

## 2024-01-30 DIAGNOSIS — D696 Thrombocytopenia, unspecified: Secondary | ICD-10-CM | POA: Diagnosis not present

## 2024-01-30 DIAGNOSIS — D72819 Decreased white blood cell count, unspecified: Secondary | ICD-10-CM | POA: Diagnosis not present

## 2024-01-30 DIAGNOSIS — R52 Pain, unspecified: Secondary | ICD-10-CM | POA: Insufficient documentation

## 2024-01-30 DIAGNOSIS — F17290 Nicotine dependence, other tobacco product, uncomplicated: Secondary | ICD-10-CM | POA: Insufficient documentation

## 2024-01-30 DIAGNOSIS — D649 Anemia, unspecified: Secondary | ICD-10-CM | POA: Diagnosis not present

## 2024-01-30 LAB — CMP (CANCER CENTER ONLY)
ALT: 10 U/L (ref 0–44)
AST: 17 U/L (ref 15–41)
Albumin: 3.5 g/dL (ref 3.5–5.0)
Alkaline Phosphatase: 97 U/L (ref 38–126)
Anion gap: 8 (ref 5–15)
BUN: 29 mg/dL — ABNORMAL HIGH (ref 8–23)
CO2: 26 mmol/L (ref 22–32)
Calcium: 8.6 mg/dL — ABNORMAL LOW (ref 8.9–10.3)
Chloride: 101 mmol/L (ref 98–111)
Creatinine: 1.27 mg/dL — ABNORMAL HIGH (ref 0.61–1.24)
GFR, Estimated: 54 mL/min — ABNORMAL LOW (ref >60)
Glucose, Bld: 115 mg/dL — ABNORMAL HIGH (ref 70–99)
Potassium: 4.3 mmol/L (ref 3.5–5.1)
Sodium: 135 mmol/L (ref 135–145)
Total Bilirubin: 0.7 mg/dL (ref 0.0–1.2)
Total Protein: 5.9 g/dL — ABNORMAL LOW (ref 6.5–8.1)

## 2024-01-30 LAB — CBC WITH DIFFERENTIAL/PLATELET
Abs Immature Granulocytes: 0.02 10*3/uL (ref 0.00–0.07)
Basophils Absolute: 0 10*3/uL (ref 0.0–0.1)
Basophils Relative: 1 %
Eosinophils Absolute: 0.3 10*3/uL (ref 0.0–0.5)
Eosinophils Relative: 9 %
HCT: 36.5 % — ABNORMAL LOW (ref 39.0–52.0)
Hemoglobin: 11.9 g/dL — ABNORMAL LOW (ref 13.0–17.0)
Immature Granulocytes: 1 %
Lymphocytes Relative: 27 %
Lymphs Abs: 0.8 10*3/uL (ref 0.7–4.0)
MCH: 33.4 pg (ref 26.0–34.0)
MCHC: 32.6 g/dL (ref 30.0–36.0)
MCV: 102.5 fL — ABNORMAL HIGH (ref 80.0–100.0)
Monocytes Absolute: 0.5 10*3/uL (ref 0.1–1.0)
Monocytes Relative: 16 %
Neutro Abs: 1.5 10*3/uL — ABNORMAL LOW (ref 1.7–7.7)
Neutrophils Relative %: 46 %
Platelets: 59 10*3/uL — ABNORMAL LOW (ref 150–400)
RBC: 3.56 MIL/uL — ABNORMAL LOW (ref 4.22–5.81)
RDW: 15.9 % — ABNORMAL HIGH (ref 11.5–15.5)
WBC: 3.2 10*3/uL — ABNORMAL LOW (ref 4.0–10.5)
nRBC: 0 % (ref 0.0–0.2)

## 2024-01-30 NOTE — Progress Notes (Signed)
 [facility]  [phone] [phone]  ID: Jack [name] OB: 08-31-1933  MR#: [id number]  AOZ#:[id number]  Patient Care Team: Jack Collard, MD as PCP - General (Family Medicine) End, Jack Gottron, MD as PCP - Cardiology (Cardiology) Jack Buttner, MD (Endocrinology) Jack Dials, MD as Consulting Physician (Hematology and Oncology)   CHIEF COMPLAINT: Stage II kappa chain myeloma.  INTERVAL HISTORY: Patient returns to clinic today for repeat laboratory work, further evaluation, and continuation of Revlimid .  He has a significant amount of fatigue which he attributes to his worsening back pain.  He has not injection scheduled in pain clinic later this week.  He otherwise feels well.  He is tolerating Revlimid  without significant side effects. He has no neurologic complaints.  He denies any fevers.  He has a good appetite and denies weight loss.  He denies chest pain, shortness of breath, cough, or hemoptysis.  He denies any nausea, vomiting, constipation, or diarrhea.  He has no urinary complaints.  Patient offers no further specific complaints today.  REVIEW OF SYSTEMS:   Review of Systems  Constitutional:  Positive for malaise/fatigue. Negative for fever.  Respiratory: Negative.  Negative for cough, hemoptysis and shortness of breath.   Cardiovascular: Negative.  Negative for chest pain and leg swelling.  Gastrointestinal: Negative.  Negative for abdominal pain.  Genitourinary: Negative.  Negative for dysuria and flank pain.  Musculoskeletal:  Positive for back pain. Negative for falls and joint pain.  Skin: Negative.  Negative for rash.  Neurological: Negative.  Negative for dizziness, focal weakness, weakness and headaches.  Psychiatric/Behavioral: Negative.  The patient is not nervous/anxious.     As per HPI. Otherwise, a complete review of systems is negative.  PAST MEDICAL HISTORY: Past Medical History:  Diagnosis Date    Ascending aortic aneurysm (HCC)    a. 12/2020 Echo: Asc Ao 48mm, Ao root 40mm. b. 03/2022 Asc Ao 4.6 cm (4.5 cm in 2019)   CKD (chronic kidney disease), stage III (HCC)    Compression fracture of body of thoracic vertebra (HCC)    Diastolic dysfunction    a. 12/2020 Echo: EF 50-55%, no rwma, mild LVH, GrI DD, nl RV fxn, mild AI. Asc Ao 48mm, Ao root 40mm.   Elevated prostate specific antigen (PSA)    has been 7 for a year    GERD (gastroesophageal reflux disease)    History of colon polyps 2008   [facility],    History of kidney stones    Hyperlipidemia    Hypertension    Myeloma (HCC)    PAF (paroxysmal atrial fibrillation) (HCC)    a. 01/2021-->Eliquis  (CHA2DS2VASc = 3-4).   Pain    Prostate hypertrophy    diagnosed at age 52 due to hematospermia    PAST SURGICAL HISTORY: Past Surgical History:  Procedure Laterality Date   CATARACT EXTRACTION W/PHACO Left 01/10/2018   Procedure: CATARACT EXTRACTION PHACO AND INTRAOCULAR LENS PLACEMENT (IOC);  Surgeon: Jack Crews, MD;  Location: [facility] ORS;  Service: Ophthalmology;  Laterality: Left;  US  00:24.8 AP% 14.9 CDE 3.68 Fluid pack lot # [id number]   CATARACT EXTRACTION W/PHACO Right 01/25/2018   Procedure: CATARACT EXTRACTION PHACO AND INTRAOCULAR LENS PLACEMENT (IOC);  Surgeon: Jack Crews, MD;  Location: [facility] ORS;  Service: Ophthalmology;  Laterality: Right;  US  00:42 AP% 10.8 CDE 4.59 Fluid pack lot # [id number]   COLON SURGERY     CYSTOSCOPY W/ URETERAL STENT PLACEMENT Right 10/16/2017   Procedure: right  URETERAL STENT PLACEMENT,cystoscopy  bilateral stent removal,rretrograde;  Surgeon: Jack Knapp, MD;  Location: [facility] ORS;  Service: Urology;  Laterality: Right;   CYSTOSCOPY/URETEROSCOPY/HOLMIUM LASER/STENT PLACEMENT Right 12/16/2020   Procedure: CYSTOSCOPY/URETEROSCOPY/HOLMIUM LASER/STENT PLACEMENT;  Surgeon: Jack Knapp, MD;  Location: [facility] ORS;  Service: Urology;  Laterality: Right;   EXTRACORPOREAL SHOCK WAVE  LITHOTRIPSY Right 12/11/2020   Procedure: EXTRACORPOREAL SHOCK WAVE LITHOTRIPSY (ESWL);  Surgeon: Jack Knapp, MD;  Location: [facility] ORS;  Service: Urology;  Laterality: Right;   IR BONE TUMOR(S)RF ABLATION  04/16/2022   IR KYPHO EA ADDL LEVEL THORACIC OR LUMBAR  02/02/2021   IR KYPHO LUMBAR INC FX REDUCE BONE BX UNI/BIL CANNULATION INC/IMAGING  02/02/2021   IR KYPHO THORACIC WITH BONE BIOPSY  04/15/2022   KIDNEY STONE SURGERY     KYPHOPLASTY N/A 03/12/2021   Procedure: Jack [name], L3;  Surgeon: Jack Angelucci, MD;  Location: [facility] ORS;  Service: Orthopedics;  Laterality: N/A;   RESECTION SOFT TISSUE TUMOR LEG / ANKLE RADICAL  jan 2009   [facility],  right thigh/knee , nonmalignant   SMALL INTESTINE SURGERY  1946   implaed on picket fence, punctured stomach   TONSILLECTOMY      FAMILY HISTORY: Family History  Problem Relation Age of Onset   Hypertension Father    Hyperlipidemia Father    Cancer Sister        thyroid  - dx in late 20's    ADVANCED DIRECTIVES (Y/N):  N  HEALTH MAINTENANCE: Social History   Tobacco Use   Smoking status: Former    Current packs/day: 0.00    Types: Cigarettes    Quit date: 07/05/1965    Years since quitting: 58.6   Smokeless tobacco: Current   Tobacco comments:    Occasional cigar  Vaping Use   Vaping status: Never Used  Substance Use Topics   Alcohol use: Not Currently    Alcohol/week: 1.0 standard drink of alcohol    Types: 1 Standard drinks or equivalent per week    Comment: occassionaly   Drug use: No     Colonoscopy:  PAP:  Bone density:  Lipid panel:  Allergies  Allergen Reactions   Quinolones     Aortic dilation contraindicates FQ   Amlodipine  Swelling and Other (See Comments)    LE edema   Azithromycin  Nausea And Vomiting   Lipitor [Atorvastatin ]     Muscle pain in RT Leg    Lisinopril  Cough   Zetia  [Ezetimibe ]     Pain in legs    Hydromorphone  Hcl     Wife states "Medication makes extremely sedated and lethargic"     Current Outpatient Medications  Medication Sig Dispense Refill   Alpha-Lipoic Acid 600 MG CAPS Take by mouth.     Ascorbic Acid  (VITAMIN C) 1000 MG tablet Take 1,000 mg by mouth in the morning and at bedtime.     Calcium  Carb-Cholecalciferol  (OYSTER SHELL CALCIUM  W/D) 500-5 MG-MCG TABS Take 1 tablet by mouth in the morning and at bedtime.     carvedilol  (COREG ) 12.5 MG tablet Take 0.5 tablets (6.25 mg total) by mouth 2 (two) times daily. 180 tablet 3   cyanocobalamin  (VITAMIN B12) 1000 MCG tablet Take 1,000 mcg by mouth daily.     dexamethasone  (DECADRON ) 4 MG tablet TAKE 5 TABLETS BY MOUTH ONCE A WEEK. 60 tablet 2   ELIQUIS  5 MG TABS tablet TAKE 1 TABLET BY MOUTH TWICE A DAY 180 tablet 0   gabapentin  (NEURONTIN ) 300 MG capsule Take 2 capsules (600 mg total)  by mouth 3 (three) times daily. (Patient taking differently: Take 600 mg by mouth 2 (two) times daily.) 180 capsule 2   ibuprofen  (ADVIL ) 200 MG tablet Take 200 mg by mouth 2 (two) times daily as needed.     lenalidomide  (REVLIMID ) 15 MG capsule Take 1 capsule (15 mg total) by mouth daily. [employer] Auth # [id number]     Date Obtained 01/09/2024 Take 1 capsule by mouth once daily for 21 days, then 7 days off. 21 capsule 0   lidocaine  (XYLOCAINE ) 5 % ointment APPLY TOPICALLY 3 (THREE) TIMES DAILY AS NEEDED FOR MILD PAIN OR MODERATE PAIN. 50 g 2   morphine  (MS CONTIN ) 15 MG 12 hr tablet Take 2 tablets (30 mg total) by mouth every 8 (eight) hours. 180 tablet 0   morphine  (MS CONTIN ) 30 MG 12 hr tablet Take 1 tablet (30 mg total) by mouth every 8 (eight) hours. 90 tablet 0   Multiple Vitamin (MULTIVITAMIN WITH MINERALS) TABS tablet Take 1 tablet by mouth daily.     Multiple Vitamins-Minerals (PRESERVISION AREDS PO) Take 1 capsule by mouth in the morning and at bedtime.     naloxone  (NARCAN ) nasal spray 4 mg/0.1 mL SPRAY 1 SPRAY INTO ONE NOSTRIL AS DIRECTED FOR OPIOID OVERDOSE (TURN PERSON ON SIDE AFTER DOSE. IF NO RESPONSE IN 2-3 MINUTES OR PERSON  RESPONDS BUT RELAPSES, REPEAT USING A NEW SPRAY DEVICE AND SPRAY INTO THE OTHER NOSTRIL. CALL 911 AFTER USE.) * EMERGENCY USE ONLY * 1 each 0   omeprazole  (PRILOSEC) 20 MG capsule TAKE 1 CAPSULE BY MOUTH EVERY DAY 90 capsule 2   Oxycodone  HCl 10 MG TABS Take 1 tablet (10 mg total) by mouth every 4 (four) hours as needed (pain). 90 tablet 0   polyethylene glycol (MIRALAX  / GLYCOLAX ) 17 g packet Take 17 g by mouth 2 (two) times daily.  0   potassium citrate  (UROCIT-K ) 10 MEQ (1080 MG) SR tablet TAKE 1 TABLET (10 MEQ TOTAL) BY MOUTH 3 (THREE) TIMES DAILY WITH MEALS. 300 tablet 2   senna-docusate (SENOKOT-S) 8.6-50 MG tablet Take 1 tablet by mouth 2 (two) times daily. 30 tablet 0   No current facility-administered medications for this visit.    OBJECTIVE: Vitals:   01/30/24 1413  BP: (!) 149/71  Pulse: 62  Resp: 16  Temp: 98.3 F (36.8 C)  SpO2: 96%       Body mass index is 25.91 kg/m.    ECOG FS:1 - Symptomatic but completely ambulatory  General: Well-developed, well-nourished, no acute distress. Eyes: Pink conjunctiva, anicteric sclera. HEENT: Normocephalic, moist mucous membranes. Lungs: No audible wheezing or coughing. Heart: Regular rate and rhythm. Abdomen: Soft, nontender, no obvious distention. Musculoskeletal: No edema, cyanosis, or clubbing. Neuro: Alert, answering all questions appropriately. Cranial nerves grossly intact. Skin: No rashes or petechiae noted. Psych: Normal affect.  LAB RESULTS:  Lab Results  Component Value Date   NA 135 01/30/2024   K 4.3 01/30/2024   CL 101 01/30/2024   CO2 26 01/30/2024   GLUCOSE 115 (H) 01/30/2024   BUN 29 (H) 01/30/2024   CREATININE 1.27 (H) 01/30/2024   CALCIUM  8.6 (L) 01/30/2024   PROT 5.9 (L) 01/30/2024   ALBUMIN 3.5 01/30/2024   AST 17 01/30/2024   ALT 10 01/30/2024   ALKPHOS 97 01/30/2024   BILITOT 0.7 01/30/2024   GFRNONAA 54 (L) 01/30/2024   GFRAA 29 (L) 10/16/2017    Lab Results  Component Value Date   WBC  3.2 (L) 01/30/2024  NEUTROABS 1.5 (L) 01/30/2024   HGB 11.9 (L) 01/30/2024   HCT 36.5 (L) 01/30/2024   MCV 102.5 (H) 01/30/2024   PLT 59 (L) 01/30/2024     STUDIES: No results found.   ASSESSMENT: Stage II Kappa chain myeloma.  PLAN:    Stage II kappa chain myeloma: (11:14 translocation, high risk) SPEP essentially negative and immunoglobulins are within normal limits.  Patient's kappa light chains initially improved from greater than 5400 down to 39.1.  PET scan results from December 08, 2021 reviewed independently with no obvious evidence of progressive disease.  Nuclear med bone scan results from December 28, 2022 did not reveal any concerning pathology.  Patient was off Revlimid  for approximately 4 months given admission for sepsis and abscess and extended antibiotic treatment.  His kappa chains increased, but are now trending back down his most recent result was 86.3.  Xgeva  has been discontinued permanently secondary to dental issues.  No further intervention is needed.  Continue current dose of Revlimid  15 mg 21 days on 7 days off.  If patient's fatigue continues to be an issue, can consider dose reduce at next clinic visit.  Return to clinic in 4 weeks for laboratory work and evaluation by clinical pharmacy.  Patient will then return to clinic in 8 weeks for further evaluation and continuation of treatment. Anemia: Mild.  Patient's hemoglobin is 11.9 today.   Leukopenia: Mild.  Patient's total white blood cell count has trended down to 3.2. Thrombocytopenia: Chronic and unchanged.  Patient's most recent platelet count has trended down slightly to 59.   Bone lesions: Recent nuclear med bone scan as above with no obvious evidence of progressive disease.  Xgeva  has been discontinued. Pain: Patient reports he has an injection later this week and pain clinic.  Nuclear medicine bone scan completed on December 28, 2022 did not report any active areas of myeloma.  Patient only takes long-acting  morphine  30 mg every 8 hours.  He also takes Tylenol  and ibuprofen  sparingly.  Previously he was instructed that if his platelets fall below 50 this would need to be discontinued.  Radiation oncology has determined no further treatments are needed at this time.   Renal insufficiency: Chronic and unchanged.  Patient's creatinine is 1.27 today.  Patient expressed understanding and was in agreement with this plan. He also understands that He can call clinic at any time with any questions, concerns, or complaints.     Cancer Staging  Kappa light chain myeloma (HCC) Staging form: Plasma Cell Myeloma and Plasma Cell Disorders, AJCC 8th Edition - Clinical stage from 04/16/2021: Albumin (g/dL): 3.8, ISS: Stage II, High-risk cytogenetics: Absent, LDH: Unknown - Signed by Jack Dials, MD on 04/16/2021 Albumin range (g/dL): Greater than or equal to 3.5 Cytogenetics: t(11;14) translocation Serum calcium  level: Normal Serum creatinine level: Normal Bone disease on imaging: Present  Jack Dials, MD   01/30/2024 3:49 PM

## 2024-01-31 LAB — KAPPA/LAMBDA LIGHT CHAINS
Kappa free light chain: 187.4 mg/L — ABNORMAL HIGH (ref 3.3–19.4)
Kappa, lambda light chain ratio: 8.88 — ABNORMAL HIGH (ref 0.26–1.65)
Lambda free light chains: 21.1 mg/L (ref 5.7–26.3)

## 2024-02-01 DIAGNOSIS — E538 Deficiency of other specified B group vitamins: Secondary | ICD-10-CM | POA: Diagnosis not present

## 2024-02-01 DIAGNOSIS — G608 Other hereditary and idiopathic neuropathies: Secondary | ICD-10-CM | POA: Diagnosis not present

## 2024-02-01 DIAGNOSIS — Z1331 Encounter for screening for depression: Secondary | ICD-10-CM | POA: Diagnosis not present

## 2024-02-01 DIAGNOSIS — C9000 Multiple myeloma not having achieved remission: Secondary | ICD-10-CM | POA: Diagnosis not present

## 2024-02-01 NOTE — Progress Notes (Addendum)
 PROVIDER NOTE: Interpretation of information contained herein should be left to medically-trained personnel. Specific patient instructions are provided elsewhere under "Patient Instructions" section of medical record. This document was created in part using STT-dictation technology, any transcriptional errors that may result from this process are unintentional.  Patient: Jack [name] Type: Established DOB: 1933-05-03 MRN: [id number] PCP: Rory Collard, MD  Service: Procedure DOS: 02/02/2024 Setting: Ambulatory Location: Ambulatory outpatient facility Delivery: Face-to-face Provider: Candi Chafe, MD Specialty: Interventional Pain Management Specialty designation: 09 Location: Outpatient facility Ref. Prov.: Renaldo Caroli, MD       Interventional Therapy  Procedure:          Inter-Laminar Thoracic Epidural Steroid Block/Injection  #2 (unable to complete secondary to severe lower extremity pressure) Laterality:  Midline Level: T12-L1  Imaging: Fluoroscopic guidance Anesthesia: Local anesthesia (1-2% Lidocaine ) Anxiolysis: None Sedation: None.  (Patient chose not to have sedation but clearly he will need sedation for procedure.) DOS: 02/02/2024 Performed by: Candi Chafe, MD  Purpose: Diagnostic/Therapeutic Indications: Thoracic back pain, radicular pain, with degenerative disc disease severe enough to impact quality of life or function. 1. Chronic thoracic back pain (Bilateral)   2. Multilevel spine pain   3. Thoracic spine pain   4. History of multiple myeloma   5. Compression fracture of thoracic vertebra, sequela   6. Collapse of thoracic vertebra, sequela   7. Compression fracture of T12 vertebra, sequela   8. Compression fracture of L1 lumbar vertebra, sequela   9. Multiple pathological fractures, sequela   10. Pathologic fracture of thoracic vertebrae, sequela   11. Pathologic lumbar vertebral fracture, sequela   12. Abnormal MRI, thoracic spine  (11/16/2023)   13. History of kyphoplasty (T11, T12, L1, L2, and L3)   14. Chronic anticoagulation (Eliquis )    NAS-11 Pain score:   Pre-procedure: 5 /10   Post-procedure: 8 /10   Note: Patient came into the clinic today indicating new symptoms of radicular thoracic pain.  He was scheduled to have a T12-L1 lumbar epidural steroid injection but upon accessing the space and injecting the contrast, the contrast was observed to have been located in the posterior epidural space but unable to spread adequately suggesting a significant stenosis just above needle placement at T12.  The patient also experienced significant bilateral lower extremity pressure upon injecting which caused him to become restless.  For this reason the procedure was aborted and an order placed for a lumbar and thoracic CT scan to evaluate for new acute changes that may explain the changes in symptoms.  He does have a history significant for multiple myeloma with multiple vertebral body fractures and subsequent kyphoplasties.    Position / Prep / Materials: Position: Prone  Prep solution: ChloraPrep (2% chlorhexidine  gluconate and 70% isopropyl alcohol) Prep Area: Posterior Thoracolumbar (Upper back from shoulders to lower lumbar region).  Materials:  Tray: Epidural Needle(s) Type: Epidural needle Gauge (G): 17 Length: Regular (3.5-in) Qty: 1  H&P (Pre-op Assessment):  Jack [name] is a 89 y.o. (year old), male patient, seen today for interventional treatment. 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 (jan 2009); Small intestine surgery (1946); Cystoscopy w/ ureteral stent placement (Right, 10/16/2017); Kidney stone surgery; Colon surgery; Tonsillectomy; Cataract extraction w/PHACO (Left, 01/10/2018); Cataract extraction w/PHACO (Right, 01/25/2018); Extracorporeal shock wave lithotripsy (Right, 12/11/2020); Cystoscopy/ureteroscopy/holmium laser/stent placement (Right, 12/16/2020); IR  KYPHO LUMBAR INC FX REDUCE BONE BX UNI/BIL CANNULATION INC/IMAGING (02/02/2021); IR KYPHO EA ADDL LEVEL THORACIC OR  LUMBAR (02/02/2021); Kyphoplasty (N/A, 03/12/2021); IR KYPHO THORACIC WITH BONE BIOPSY (04/15/2022); and IR Bone Tumor(s)RF Ablation (04/16/2022). Jack [name] has a current medication list which includes the following prescription(s): alpha-lipoic acid, vitamin c, oyster shell calcium  w/d, carvedilol , cyanocobalamin , dexamethasone , eliquis , gabapentin , ibuprofen , lenalidomide , lidocaine , morphine , morphine , multivitamin with minerals, multiple vitamins-minerals, naloxone , omeprazole , oxycodone  hcl, polyethylene glycol, potassium citrate , and senna-docusate. His primarily concern today is the Back Pain  Initial Vital Signs:  Pulse/HCG Rate: (!) 47 (Patient preference)ECG Heart Rate: (!) 40 (SB. Dr. Barth Borne aware.) Temp: (!) 97.3 F (36.3 C) Resp: 18 BP: (!) 152/65 SpO2: 96 %  BMI: Estimated body mass index is 25.84 kg/m as calculated from the following:   Height as of this encounter: 5\' 7"  (1.702 m).   Weight as of this encounter: 165 lb (74.8 kg).  Risk Assessment: Allergies: Reviewed. He is allergic to quinolones, amlodipine , azithromycin , lipitor [atorvastatin ], lisinopril , zetia  [ezetimibe ], and hydromorphone  hcl.  Allergy Precautions: None required Coagulopathies: Reviewed. None identified.  Blood-thinner therapy: None at this time Active Infection(s): Reviewed. None identified. Mr. Rico is afebrile  Site Confirmation: Jack [name] was asked to confirm the procedure and laterality before marking the site Procedure checklist: Completed Consent: Before the procedure and under the influence of no sedative(s), amnesic(s), or anxiolytics, the patient was informed of the treatment options, risks and possible complications. To fulfill our ethical and legal obligations, as recommended by the American Medical Association's Code of Ethics, I have informed the patient of my clinical impression;  the nature and purpose of the treatment or procedure; the risks, benefits, and possible complications of the intervention; the alternatives, including doing nothing; the risk(s) and benefit(s) of the alternative treatment(s) or procedure(s); and the risk(s) and benefit(s) of doing nothing. The patient was provided information about the general risks and possible complications associated with the procedure. These may include, but are not limited to: failure to achieve desired goals, infection, bleeding, organ or nerve damage, allergic reactions, paralysis, and death. In addition, the patient was informed of those risks and complications associated to Spine-related procedures, such as failure to decrease pain; infection (i.e.: Meningitis, epidural or intraspinal abscess); bleeding (i.e.: epidural hematoma, subarachnoid hemorrhage, or any other type of intraspinal or peri-dural bleeding); organ or nerve damage (i.e.: Any type of peripheral nerve, nerve root, or spinal cord injury) with subsequent damage to sensory, motor, and/or autonomic systems, resulting in permanent pain, numbness, and/or weakness of one or several areas of the body; allergic reactions; (i.e.: anaphylactic reaction); and/or death. Furthermore, the patient was informed of those risks and complications associated with the medications. These include, but are not limited to: allergic reactions (i.e.: anaphylactic or anaphylactoid reaction(s)); adrenal axis suppression; blood sugar elevation that in diabetics may result in ketoacidosis or comma; water retention that in patients with history of congestive heart failure may result in shortness of breath, pulmonary edema, and decompensation with resultant heart failure; weight gain; swelling or edema; medication-induced neural toxicity; particulate matter embolism and blood vessel occlusion with resultant organ, and/or nervous system infarction; and/or aseptic necrosis of one or more joints. Finally,  the patient was informed that Medicine is not an exact science; therefore, there is also the possibility of unforeseen or unpredictable risks and/or possible complications that may result in a catastrophic outcome. The patient indicated having understood very clearly. We have given the patient no guarantees and we have made no promises. Enough time was given to the patient to ask questions, all of which were answered to the patient's satisfaction.  Mr. Buckle has indicated that he wanted to continue with the procedure. Attestation: I, the ordering provider, attest that I have discussed with the patient the benefits, risks, side-effects, alternatives, likelihood of achieving goals, and potential problems during recovery for the procedure that I have provided informed consent. Date  Time: 02/02/2024  8:02 AM  Pre-Procedure Preparation:  Monitoring: As per clinic protocol. Respiration, ETCO2, SpO2, BP, heart rate and rhythm monitor placed and checked for adequate function Safety Precautions: Patient was assessed for positional comfort and pressure points before starting the procedure. Time-out: I initiated and conducted the "Time-out" before starting the procedure, as per protocol. The patient was asked to participate by confirming the accuracy of the "Time Out" information. Verification of the correct person, site, and procedure were performed and confirmed by me, the nursing staff, and the patient. "Time-out" conducted as per Joint Commission's Universal Protocol (UP.01.01.01). Time: 0836 Start Time: 0836 hrs.   Description of Procedure:         Target Area: For Epidural Steroid injection(s), the target area is the  interlaminar space, initially targeting the lower border of the superior vertebral body lamina. Approach: Interlaminar approach. Area Prepped: Entire Posterior Thoracolumbar Region ChloraPrep (2% chlorhexidine  gluconate and 70% isopropyl alcohol) Safety Precautions: Aspiration looking for  blood return was conducted prior to all injections. At no point did we inject any substances, as a needle was being advanced. No attempts were made at seeking any paresthesias. Safe injection practices and needle disposal techniques used. Medications properly checked for expiration dates. SDV (single dose vial) medications used. Description of the Procedure: Protocol guidelines were followed. The patient was placed in position over the fluoroscopy table. The target area was identified and the area prepped in the usual manner. Skin & deeper tissues infiltrated with local anesthetic. Appropriate amount of time allowed to pass for local anesthetics to take effect. The procedure needles were then advanced to the target area. The inferior aspect of the superior lamina was contacted and the needle walked caudad, until the lamina was cleared. The epidural space was identified using "loss-of-resistance technique" with 0.9% PF-NSS (2-63mL), in a low friction 10cc LOR glass syringe. Proper needle placement was secured. Negative aspiration confirmed.  Contrast injected to confirm placement.  S2 volume of contrast injected increase, the patient began to complain about significant pressure in the lower extremities.  No sharp pain (paresthesia).  Lateral view confirmed an apparent cut off within the epidural space above the injected area with pooling of contrast distal to the site of access (T12-L1).  Because of the significant pressure that the patient was experiencing and that level the decision was made to go to the level above, T11-12, in the hopes of clearing the obstruction however significant difficulty was observed getting an opening between the lamina.  The problem was augmented by the patient's osteoporosis which caused the disorientation of the bone to be difficult specially with the contrast from prior kyphoplasties.  Once the space was accessed aspiration at the T11-12 level yielded some blood.  For this reason the  needle was withdrawn and the procedure aborted.  Vitals:   02/02/24 1008 02/02/24 1016 02/02/24 1019 02/02/24 1131  BP: (!) 173/80 (!) 165/78  (!) 175/85  Pulse:      Resp: 16 16  18   Temp:      TempSrc:      SpO2: 100% (!) 88% (!) 51% (!) 54%  Weight:      Height:  Start Time: 0836 hrs. End Time: 0858 hrs.  Imaging Guidance (Spinal):          Type of Imaging Technique: Fluoroscopy Guidance (Spinal) Indication(s): Fluoroscopy guidance for needle placement to enhance accuracy in procedures requiring precise needle localization for targeted delivery of medication in or near specific anatomical locations not easily accessible without such real-time imaging assistance. Exposure Time: Please see nurses notes. Contrast: Before injecting any contrast, we confirmed that the patient did not have an allergy to iodine , shellfish, or radiological contrast. Once satisfactory needle placement was completed at the desired level, radiological contrast was injected. Contrast injected under live fluoroscopy. No contrast complications. See chart for type and volume of contrast used. Fluoroscopic Guidance: I was personally present during the use of fluoroscopy. "Tunnel Vision Technique" used to obtain the best possible view of the target area. Parallax error corrected before commencing the procedure. "Direction-depth-direction" technique used to introduce the needle under continuous pulsed fluoroscopy. Once target was reached, antero-posterior, oblique, and lateral fluoroscopic projection used confirm needle placement in all planes. Images permanently stored in EMR.      Interpretation: I personally interpreted the imaging intraoperatively. Adequate needle placement confirmed in multiple planes. Appropriate spread of contrast into desired area was observed. No evidence of afferent or efferent intravascular uptake. No intrathecal or subarachnoid spread observed. Permanent images saved into the patient's  record.            Antibiotic Prophylaxis:   Anti-infectives (From admission, onward)    None      Indication(s): None identified   Post-operative Assessment:  Post-procedure Vital Signs:  Pulse/HCG Rate: (!) 47 (Patient preference)(!) 55 Temp: (!) 97.3 F (36.3 C) Resp: 18 BP: (!) 175/85 SpO2: (!) 54 %  EBL: None  Complications: No immediate post-treatment complications observed by team, or reported by patient.  Note: The patient tolerated the entire procedure well. A repeat set of vitals were taken after the procedure and the patient was kept under observation following institutional policy, for this type of procedure. Post-procedural neurological assessment was performed, showing return to baseline, prior to discharge.  Before leaving, the patient indicated having persistent and significant lower extremity pain, which he was not experiencing prior to the procedure.  For this reason a stat CT scan was ordered of the thoracic and lumbar area to assist the new symptoms that the patient was experiencing, including the thoracic radicular pain that he had described just prior to the procedure.  Comments:  Stat CT scans of thoracic and lumbar area ordered and the patient kept under observation.  Plan of Care (POC)  Orders:  Orders Placed This Encounter  Procedures   Thoracic Epidural Injection    Scheduling Instructions:     Side: Midline     Sedation: Patient's choice.     Timeframe: Today    Where will this procedure be performed?:   [facility]   DG PAIN [facility] C-ARM 1-60 MIN NO REPORT    Intraoperative interpretation by procedural physician at [facility].    Standing Status:   Standing    Number of Occurrences:   1    Reason for exam::   Assistance in needle guidance and placement for procedures requiring needle placement in or near specific anatomical locations not easily accessible without such assistance.   CT THORACIC SPINE WO CONTRAST     Patient presents with axial pain with possible radicular component. Please assist us  in identifying specific level(s) and laterality of any additional findings such  as: 1. Facet (Zygapophyseal) joint DJD (Hypertrophy, space narrowing, subchondral sclerosis, and/or osteophyte formation) 2. DDD and/or IVDD (Loss of disc height, desiccation, gas patterns, osteophytes, endplate sclerosis, or "Black disc disease") 3. Pars defects 4. Spondylolisthesis, spondylosis, and/or spondyloarthropathies (include Degree/Grade of displacement in mm) (stability) 5. Vertebral body Fractures (acute/chronic) (state percentage of collapse) 6. Demineralization (osteopenia/osteoporotic) 7. Bone pathology 8. Foraminal narrowing  9. Surgical changes 10. Central, Lateral Recess, and/or Foraminal Stenosis (include AP diameter of stenosis in mm) 11. Surgical changes (hardware type, status, and presence of fibrosis) 12. Modic Type Changes (MRI only) 13. IVDD (Disc bulge, protrusion, herniation, extrusion) (Level, laterality, extent)  Medical necessity: Imaging study necessary to evaluate for possible degenerative disease responsible for neurologic dysfunction that may be caused by compression or inflammation of CNS and to help plan for interventional therapy or surgery, such as decompression of a pinched nerve or spinal fusion.    Standing Status:   Future    Expiration Date:   05/04/2024    Scheduling Instructions:     Please make sure that the patient understands that this needs to be done as soon as possible. Never have the patient do the imaging "just before the next appointment". Inform patient that having the imaging done within the [facility] will expedite the availability of the results and will provide      imaging availability to the requesting physician. In addition inform the patient that the imaging order has an expiration date and will not be renewed if not done within the active period.    Preferred imaging  location?:   [facility]    Call Results- Best Contact Number?:   [phone] [place] [facility] at [facility]    Radiology Contrast Protocol - do NOT remove file path:   \\[email or web address]    Release to patient:   Immediate [1]   CT LUMBAR SPINE WO CONTRAST    Patient presents with axial pain with possible radicular component. Please assist us  in identifying specific level(s) and laterality of any additional findings such as: 1. Facet (Zygapophyseal) joint DJD (Hypertrophy, space narrowing, subchondral sclerosis, and/or osteophyte formation) 2. DDD and/or IVDD (Loss of disc height, desiccation, gas patterns, osteophytes, endplate sclerosis, or "Black disc disease") 3. Pars defects 4. Spondylolisthesis, spondylosis, and/or spondyloarthropathies (include Degree/Grade of displacement in mm) (stability) 5. Vertebral body Fractures (acute/chronic) (state percentage of collapse) 6. Demineralization (osteopenia/osteoporotic) 7. Bone pathology 8. Foraminal narrowing  9. Surgical changes 10. Central, Lateral Recess, and/or Foraminal Stenosis (include AP diameter of stenosis in mm) 11. Surgical changes (hardware type, status, and presence of fibrosis) 12. Modic Type Changes (MRI only) 13. IVDD (Disc bulge, protrusion, herniation, extrusion) (Level, laterality, extent)  Medical necessity: Imaging study necessary to evaluate for possible degenerative disease responsible for neurologic dysfunction that may be caused by compression or inflammation of CNS and to help plan for interventional therapy or surgery, such as decompression of a pinched nerve or spinal fusion.    Standing Status:   Future    Expiration Date:   05/04/2024    Scheduling Instructions:     Please make sure that the patient understands that this needs to be done as soon as possible. Never have the patient do the imaging "just before the next appointment". Inform patient  that having the imaging done within the [facility] will expedite the availability of the results and will provide      imaging availability to the requesting physician. In addition inform the patient  that the imaging order has an expiration date and will not be renewed if not done within the active period.    Preferred imaging location?:   [facility]    Call Results- Best Contact Number?:   [phone] [place] [facility] at [facility]    Radiology Contrast Protocol - do NOT remove file path:   \\[email or web address]    Release to patient:   Immediate [1]   CT LUMBAR SPINE WO CONTRAST    Patient presents with axial pain with possible radicular component. Please assist us  in identifying specific level(s) and laterality of any additional findings such as: 1. Facet (Zygapophyseal) joint DJD (Hypertrophy, space narrowing, subchondral sclerosis, and/or osteophyte formation) 2. DDD and/or IVDD (Loss of disc height, desiccation, gas patterns, osteophytes, endplate sclerosis, or "Black disc disease") 3. Pars defects 4. Spondylolisthesis, spondylosis, and/or spondyloarthropathies (include Degree/Grade of displacement in mm) (stability) 5. Vertebral body Fractures (acute/chronic) (state percentage of collapse) 6. Demineralization (osteopenia/osteoporotic) 7. Bone pathology 8. Foraminal narrowing  9. Surgical changes 10. Central, Lateral Recess, and/or Foraminal Stenosis (include AP diameter of stenosis in mm) 11. Surgical changes (hardware type, status, and presence of fibrosis) 12. Modic Type Changes (MRI only) 13. IVDD (Disc bulge, protrusion, herniation, extrusion) (Level, laterality, extent)  Medical necessity: Imaging study necessary to evaluate for possible degenerative disease responsible for neurologic dysfunction that may be caused by compression or inflammation of CNS and to help plan for interventional therapy or surgery, such  as decompression of a pinched nerve or spinal fusion.    Scheduling Instructions:     Please make sure that the patient understands that this needs to be done as soon as possible. Never have the patient do the imaging "just before the next appointment". Inform patient that having the imaging done within the [facility] will expedite the availability of the results and will provide      imaging availability to the requesting physician. In addition inform the patient that the imaging order has an expiration date and will not be renewed if not done within the active period.    Preferred imaging location?:   [facility]    Call Results- Best Contact Number?:   [phone] [place] [facility] at [facility]    Radiology Contrast Protocol - do NOT remove file path:   \\[email or web address]    Release to patient:   Immediate [1]   CT THORACIC SPINE WO CONTRAST    Patient presents with axial pain with possible radicular component. Please assist us  in identifying specific level(s) and laterality of any additional findings such as: 1. Facet (Zygapophyseal) joint DJD (Hypertrophy, space narrowing, subchondral sclerosis, and/or osteophyte formation) 2. DDD and/or IVDD (Loss of disc height, desiccation, gas patterns, osteophytes, endplate sclerosis, or "Black disc disease") 3. Pars defects 4. Spondylolisthesis, spondylosis, and/or spondyloarthropathies (include Degree/Grade of displacement in mm) (stability) 5. Vertebral body Fractures (acute/chronic) (state percentage of collapse) 6. Demineralization (osteopenia/osteoporotic) 7. Bone pathology 8. Foraminal narrowing  9. Surgical changes 10. Central, Lateral Recess, and/or Foraminal Stenosis (include AP diameter of stenosis in mm) 11. Surgical changes (hardware type, status, and presence of fibrosis) 12. Modic Type Changes (MRI only) 13. IVDD (Disc bulge, protrusion, herniation, extrusion) (Level,  laterality, extent)  Medical necessity: Imaging study necessary to evaluate for possible degenerative disease responsible for neurologic dysfunction that may be caused by compression or inflammation of CNS and to help plan for interventional therapy or surgery, such as decompression of a pinched nerve or spinal  fusion.    Scheduling Instructions:     Please make sure that the patient understands that this needs to be done as soon as possible. Never have the patient do the imaging "just before the next appointment". Inform patient that having the imaging done within the [facility] [facility] will expedite the availability of the results and will provide      imaging availability to the requesting physician. In addition inform the patient that the imaging order has an expiration date and will not be renewed if not done within the active period.    Preferred imaging location?:    [facility]    Call Results- Best Contact Number?:   [phone] HOLD PT    Radiology Contrast Protocol - do NOT remove file path:   [email or web address]    Release to patient:   Immediate [1]   Informed Consent Details: Physician/Practitioner Attestation; Transcribe to consent form and obtain patient signature    Provider Attestation: I, Brendan Gadson A. Barth Borne, MD, (Pain Management Specialist), the physician/practitioner, attest that I have discussed with the patient the benefits, risks, side effects, alternatives, likelihood of achieving goals and potential problems during recovery for the procedure that I have provided informed consent.    Scheduling Instructions:     Nursing Order: Transcribe to consent form and obtain patient signature.     Note: Always confirm laterality of pain with Mr. Keena, before procedure.    Physician/Practitioner attestation of informed consent for procedure/surgical case:   I, the physician/practitioner, attest that I have discussed with the patient the benefits, risks, side  effects, alternatives, likelihood of achieving goals and potential problems during recovery for the procedure that I have provided informed consent.    Procedure:   Thoracic Epidural Steroid Injection/Block under fluoroscopic guidance    Physician/Practitioner performing the procedure:   Baily Hovanec A. 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   Care order/instruction: Please confirm that the patient has stopped the Eliquis  (Apixaban ) x 3 days prior to procedure or surgery.    Please confirm that the patient has stopped the Eliquis  (Apixaban ) x 3 days prior to procedure or surgery.    Standing Status:   Standing    Number of Occurrences:   1   Provide equipment / supplies at bedside    Procedural tray: Epidural Tray (Disposable  single use) Skin infiltration needle: Regular 1.5-in, 25-G, (x1) Block needle size: Regular standard Catheter: No catheter required    Standing Status:   Standing    Number of Occurrences:   1    Specify:   Epidural Tray   Saline lock IV    Have LR [phone] mL available and administer at 125 mL/hr if patient becomes hypotensive.    Standing Status:   Standing    Number of Occurrences:   1   Bleeding precautions    Standing Status:   Standing    Number of Occurrences:   1    Chronic Opioid Analgesic: No chronic opioid analgesics therapy prescribed by our practice. oxycodone  IR 10 mg tablet, 2 tabs p.o. 3 times daily (# 90) (last filled on 03/11/2023) (90 MME); morphine  ER 30 mg tablet, 1 tab p.o. 3 times daily (# 90) (last filled on 03/24/2023) (90 MME) by Shellie Dials, MD (Oncology)  MME/day: 180 mg/day   Medications ordered for procedure: Meds  ordered this encounter  Medications   iohexol  (OMNIPAQUE ) 180 MG/ML injection 10 mL    Must  be Myelogram-compatible. If not available, you may substitute with a water-soluble, non-ionic, hypoallergenic, myelogram-compatible radiological contrast medium.   lidocaine  (XYLOCAINE ) 2 % (with pres) injection 400 mg   pentafluoroprop-tetrafluoroeth (GEBAUERS) aerosol   DISCONTD: midazolam  (VERSED ) injection 0.5-2 mg    Make sure Flumazenil is available in the pyxis when using this medication. If oversedation occurs, administer 0.2 mg IV over 15 sec. If after 45 sec no response, administer 0.2 mg again over 1 min; may repeat at 1 min intervals; not to exceed 4 doses (1 mg)   sodium chloride  flush (NS) 0.9 % injection 2 mL   ropivacaine  (PF) 2 mg/mL (0.2%) (NAROPIN ) injection 2 mL   dexamethasone  (DECADRON ) injection 10 mg   DISCONTD: fentaNYL  (SUBLIMAZE ) injection 25-50 mcg    Make sure Narcan  is available in the pyxis when using this medication. In the event of respiratory depression (RR< 8/min): Titrate NARCAN  (naloxone ) in increments of 0.1 to 0.2 mg IV at 2-3 minute intervals, until desired degree of reversal.   DISCONTD: midazolam  (VERSED ) 5 MG/5ML injection 0.5-2 mg    Make sure Flumazenil is available in the pyxis when using this medication. If oversedation occurs, administer 0.2 mg IV over 15 sec. If after 45 sec no response, administer 0.2 mg again over 1 min; may repeat at 1 min intervals; not to exceed 4 doses (1 mg)   Medications administered: We administered iohexol , lidocaine , pentafluoroprop-tetrafluoroeth, sodium chloride  flush, ropivacaine  (PF) 2 mg/mL (0.2%), dexamethasone , and fentaNYL .  See the medical record for exact dosing, route, and time of administration.  Follow-up plan:   Return in about 2 weeks (around 02/16/2024) for (Face2F), (PPE).       Interventional Therapies  Risk Factors  Considerations  Comorbidities ELIQUIS  Anticoagulation (Stop: 3 days  Restart: 6 hours)  Multiple Myeloma  polypharmacy  High Dose opioid Therapy  Stage3b CKD  ascending aortic  aneurysm  diastolic dysfunction  GERD  A-fib  nephrolithiasis  HTN  MV insufficiency     Planned  Pending:      Under consideration:   (02/02/2024) No further interventions using the epidural space.  See 02/02/2024 note and results of lumbar and thoracic CT scans.   Completed:  Diagnostic bilateral thoracic facet MBB (T11-L2) x1 (12/22/2023) (80/80/50/50)  Diagnostic/therapeutic midline T12-L1 thoracolumbar LESI x1, (11/22/2023) (100/50/80/80 x 2.5 weeks)  Therapeutic midline T12-L1 TESI #2 (02/02/2024) (Aborted.  See procedure note.)   Therapeutic  Palliative (PRN) options:   None established   Completed by other providers:   None reported      Recent Visits Date Type Provider Dept  01/17/24 Office Visit Renaldo Caroli, MD [facility]  12/22/23 Procedure visit Renaldo Caroli, MD [facility]  12/13/23 Office Visit Renaldo Caroli, MD [facility]  11/22/23 Procedure visit Renaldo Caroli, MD [facility]  11/16/23 Office Visit Renaldo Caroli, MD [facility]  Showing recent visits within past 90 days and meeting all other requirements Today's Visits Date Type Provider Dept  02/02/24 Procedure visit Renaldo Caroli, MD [facility]  Showing today's visits and meeting all other requirements Future Appointments Date Type Provider Dept  02/20/24 Appointment Renaldo Caroli, MD [facility]  Showing future appointments within next 90 days and meeting all other requirements  Disposition: Discharge home  Discharge (Date  Time): 02/02/2024; 0915 hrs.   Primary Care Physician: Rory Collard, MD Location: [facility] Management Facility Note by: Candi Chafe, MD (TTS technology used. I apologize for any typographical errors that were not detected and corrected.) Date: 02/02/2024; Time: 1:43 PM  Disclaimer:  Medicine is not an Visual merchandiser. The only guarantee in medicine  is that nothing is guaranteed. It is important to note that the decision to proceed with this intervention was based on the information collected from the patient. The Data and conclusions were drawn from the patient's questionnaire, the interview, and the physical examination. Because the information was provided in large part by the patient, it cannot be guaranteed that it has not been purposely or unconsciously manipulated. Every effort has been made to obtain as much relevant data as possible for this evaluation. It is important to note that the conclusions that lead to this procedure are derived in large part from the available data. Always take into account that the treatment will also be dependent on availability of resources and existing treatment guidelines, considered by other Pain Management Practitioners as being common knowledge and practice, at the time of the intervention. For Medico-Legal purposes, it is also important to point out that variation in procedural techniques and pharmacological choices are the acceptable norm. The indications, contraindications, technique, and results of the above procedure should only be interpreted and judged by a Board-Certified Interventional Pain Specialist with extensive familiarity and expertise in the same exact procedure and technique.

## 2024-02-02 ENCOUNTER — Encounter: Payer: Self-pay | Admitting: Pain Medicine

## 2024-02-02 ENCOUNTER — Ambulatory Visit
Admission: RE | Admit: 2024-02-02 | Discharge: 2024-02-02 | Disposition: A | Discharge status: Home / Self Care | Source: Ambulatory Visit | Attending: Pain Medicine | Admitting: Pain Medicine

## 2024-02-02 ENCOUNTER — Ambulatory Visit (HOSPITAL_BASED_OUTPATIENT_CLINIC_OR_DEPARTMENT_OTHER): Admitting: Pain Medicine

## 2024-02-02 VITALS — BP 156/88 | HR 63 | Temp 97.4°F | Resp 18 | Ht 67.0 in | Wt 165.0 lb

## 2024-02-02 DIAGNOSIS — M5134 Other intervertebral disc degeneration, thoracic region: Secondary | ICD-10-CM | POA: Insufficient documentation

## 2024-02-02 DIAGNOSIS — G8918 Other acute postprocedural pain: Secondary | ICD-10-CM | POA: Insufficient documentation

## 2024-02-02 DIAGNOSIS — G8929 Other chronic pain: Secondary | ICD-10-CM | POA: Diagnosis not present

## 2024-02-02 DIAGNOSIS — M545 Low back pain, unspecified: Secondary | ICD-10-CM | POA: Insufficient documentation

## 2024-02-02 DIAGNOSIS — Z7901 Long term (current) use of anticoagulants: Secondary | ICD-10-CM

## 2024-02-02 DIAGNOSIS — Z9889 Other specified postprocedural states: Secondary | ICD-10-CM | POA: Insufficient documentation

## 2024-02-02 DIAGNOSIS — M4800 Spinal stenosis, site unspecified: Secondary | ICD-10-CM | POA: Diagnosis not present

## 2024-02-02 DIAGNOSIS — M546 Pain in thoracic spine: Secondary | ICD-10-CM | POA: Diagnosis not present

## 2024-02-02 DIAGNOSIS — S22080S Wedge compression fracture of T11-T12 vertebra, sequela: Secondary | ICD-10-CM

## 2024-02-02 DIAGNOSIS — S32020S Wedge compression fracture of second lumbar vertebra, sequela: Secondary | ICD-10-CM | POA: Insufficient documentation

## 2024-02-02 DIAGNOSIS — M4854XS Collapsed vertebra, not elsewhere classified, thoracic region, sequela of fracture: Secondary | ICD-10-CM | POA: Diagnosis not present

## 2024-02-02 DIAGNOSIS — M8440XS Pathological fracture, unspecified site, sequela: Secondary | ICD-10-CM

## 2024-02-02 DIAGNOSIS — R937 Abnormal findings on diagnostic imaging of other parts of musculoskeletal system: Secondary | ICD-10-CM | POA: Insufficient documentation

## 2024-02-02 DIAGNOSIS — S32000S Wedge compression fracture of unspecified lumbar vertebra, sequela: Secondary | ICD-10-CM | POA: Diagnosis not present

## 2024-02-02 DIAGNOSIS — S32030S Wedge compression fracture of third lumbar vertebra, sequela: Secondary | ICD-10-CM | POA: Diagnosis not present

## 2024-02-02 DIAGNOSIS — G893 Neoplasm related pain (acute) (chronic): Secondary | ICD-10-CM

## 2024-02-02 DIAGNOSIS — M8448XS Pathological fracture, other site, sequela: Secondary | ICD-10-CM | POA: Insufficient documentation

## 2024-02-02 DIAGNOSIS — S32010S Wedge compression fracture of first lumbar vertebra, sequela: Secondary | ICD-10-CM | POA: Insufficient documentation

## 2024-02-02 DIAGNOSIS — M4854XA Collapsed vertebra, not elsewhere classified, thoracic region, initial encounter for fracture: Secondary | ICD-10-CM | POA: Diagnosis not present

## 2024-02-02 DIAGNOSIS — M549 Dorsalgia, unspecified: Secondary | ICD-10-CM | POA: Insufficient documentation

## 2024-02-02 DIAGNOSIS — S22000S Wedge compression fracture of unspecified thoracic vertebra, sequela: Secondary | ICD-10-CM | POA: Diagnosis not present

## 2024-02-02 DIAGNOSIS — M4856XA Collapsed vertebra, not elsewhere classified, lumbar region, initial encounter for fracture: Secondary | ICD-10-CM | POA: Diagnosis not present

## 2024-02-02 DIAGNOSIS — Z8579 Personal history of other malignant neoplasms of lymphoid, hematopoietic and related tissues: Secondary | ICD-10-CM | POA: Diagnosis not present

## 2024-02-02 DIAGNOSIS — I7121 Aneurysm of the ascending aorta, without rupture: Secondary | ICD-10-CM | POA: Diagnosis not present

## 2024-02-02 DIAGNOSIS — M4804 Spinal stenosis, thoracic region: Secondary | ICD-10-CM | POA: Diagnosis not present

## 2024-02-02 DIAGNOSIS — M5459 Other low back pain: Secondary | ICD-10-CM | POA: Insufficient documentation

## 2024-02-02 MED ORDER — FENTANYL CITRATE (PF) 100 MCG/2ML IJ SOLN
25.0000 ug | INTRAMUSCULAR | Status: DC | PRN
  Administered 2024-02-02: 50 ug via INTRAVENOUS

## 2024-02-02 MED ORDER — IOHEXOL 180 MG/ML  SOLN
10.0000 mL | Freq: Once | INTRAMUSCULAR | Status: AC
  Administered 2024-02-02: 10 mL via EPIDURAL
  Filled 2024-02-02: qty 20

## 2024-02-02 MED ORDER — FENTANYL CITRATE (PF) 100 MCG/2ML IJ SOLN
INTRAMUSCULAR | Status: AC
  Filled 2024-02-02: qty 2

## 2024-02-02 MED ORDER — DEXAMETHASONE SODIUM PHOSPHATE 10 MG/ML IJ SOLN
10.0000 mg | Freq: Once | INTRAMUSCULAR | Status: AC
Start: 2024-02-02 — End: 2024-02-02
  Administered 2024-02-02: 10 mg
  Filled 2024-02-02: qty 1

## 2024-02-02 MED ORDER — LIDOCAINE HCL 2 % IJ SOLN
20.0000 mL | Freq: Once | INTRAMUSCULAR | Status: AC
Start: 2024-02-02 — End: 2024-02-02
  Administered 2024-02-02: 100 mg
  Filled 2024-02-02: qty 40

## 2024-02-02 MED ORDER — PENTAFLUOROPROP-TETRAFLUOROETH EX AERO
INHALATION_SPRAY | Freq: Once | CUTANEOUS | Status: AC
  Administered 2024-02-02: 30 via TOPICAL

## 2024-02-02 MED ORDER — ROPIVACAINE HCL 2 MG/ML IJ SOLN
2.0000 mL | Freq: Once | INTRAMUSCULAR | Status: AC
  Administered 2024-02-02: 2 mL via EPIDURAL
  Filled 2024-02-02: qty 20

## 2024-02-02 MED ORDER — SODIUM CHLORIDE 0.9% FLUSH
2.0000 mL | Freq: Once | INTRAVENOUS | Status: AC
  Administered 2024-02-02: 2 mL

## 2024-02-02 MED ORDER — MIDAZOLAM HCL 5 MG/5ML IJ SOLN
0.5000 mg | Freq: Once | INTRAMUSCULAR | Status: DC
Start: 2024-02-02 — End: 2024-02-02

## 2024-02-02 MED ORDER — MIDAZOLAM HCL 2 MG/2ML IJ SOLN: 0.5000 mg | Freq: Once | INTRAMUSCULAR | Status: DC

## 2024-02-02 MED ORDER — MIDAZOLAM HCL 2 MG/2ML IJ SOLN
INTRAMUSCULAR | Status: AC
  Filled 2024-02-02: qty 2

## 2024-02-02 NOTE — Addendum Note (Signed)
 Addended by: Renaldo Caroli A on: 02/02/2024 10:31 AM   Modules accepted: Orders

## 2024-02-02 NOTE — Progress Notes (Signed)
 Assisted patient up on side of bed to void. Voided yellow colored urine in urinal. Patient moved well. Assisted back top bed  positioned on right side per patient. Adjusted pillows. Patient with no complaint of pain.

## 2024-02-02 NOTE — Addendum Note (Signed)
 Addended by: Renaldo Caroli A on: 02/02/2024 10:07 AM   Modules accepted: Orders

## 2024-02-02 NOTE — Progress Notes (Addendum)
 Safety precautions to be maintained throughout the outpatient stay will include: orient to surroundings, keep bed in low position, maintain call bell within reach at all times, provide assistance with transfer out of bed and ambulation. To CT via bed stat for pain.  Wife at bedside. Pt alert and oriented.  Pain 8/10.  Returned from CT scan via bed.  Awake alert and oriented. Still c/o pain 8/10.   VSS  1156 Patient voided.  VSS Resting quietly with wife at bedside. Call bell in reach.

## 2024-02-02 NOTE — Progress Notes (Signed)
(  02/02/2024) 1:08 PM I have received and reviewed the reports on the thoracic and lumbar CT as well as the images.  Copies of the key images were provided to the patient's wife along with an explanation of the events.  The following information was provided to them: Based on the clinical events and diagnostic imaging, the T12-L1 epidural access was achieved as planned.  However, it would appear that the patient has epidural fibrosis that prevented the normal spread of contrast through the epidural space resulting in pooling of the contrast which temporarily created pressure and compression of the cord at the injected level resulting in the acute lower extremity symptoms that the patient was experiencing.  Because that pressure was identified early, no further medication or contrast was injected preventing further compression and more serious consequences.  Because of the acute event, a thoracic and a CT scan were ordered since the injection was performed at the T12-L1 level (thoracolumbar region).  In the thoracic CT the contrast can be seen at the very lower most portion of the scan while in the Lumbar CT he can be seen at the open most portion of the lumbar spine images.  The CT scans helped rule out bleeding and a hematoma and they confirm the contrast to be outside of the intrathecal space (epidural space) where we intended to inject it.  The patient and his wife have been given reassurance that the contrast should be absorbed with time relieving the pressure.  As we observe the patient today we have confirmed his symptoms to be improving as that contrast is being absorbed.  A full explanation of the events was given to them and they were recommended not to restart the Eliquis  until tomorrow morning as opposed to restarting that Eliquis  6 hours after the procedure, as it is usually the protocol.  I have also explained to them that due to the new information that we have regarding the patency of the epidural  canal, I do not recommend any further epidural injections as they could cause similar symptoms again.  This new finding also would suggest that the patient would not be a good candidate for implant therapy involving the epidural canal such as the spinal cord stimulators.  I will follow-up with the patient in about 2 weeks to reevaluate his current condition.  I have instructed them to stay on the opioid analgesics which we will have to rely on for the treatment of his chronic pain.  They understood and accepted.

## 2024-02-02 NOTE — Patient Instructions (Addendum)

## 2024-02-02 NOTE — Addendum Note (Signed)
 Addended by: Renaldo Caroli A on: 02/02/2024 10:11 AM   Modules accepted: Orders

## 2024-02-03 ENCOUNTER — Other Ambulatory Visit: Payer: Self-pay

## 2024-02-03 ENCOUNTER — Other Ambulatory Visit: Payer: Self-pay | Admitting: *Deleted

## 2024-02-03 ENCOUNTER — Telehealth: Payer: Self-pay

## 2024-02-03 ENCOUNTER — Encounter: Payer: Self-pay | Admitting: Oncology

## 2024-02-03 MED ORDER — MORPHINE SULFATE ER 15 MG PO TBCR
30.0000 mg | EXTENDED_RELEASE_TABLET | Freq: Three times a day (TID) | ORAL | 0 refills | Status: DC
  Filled 2024-02-03 – 2024-02-20 (×3): qty 180, 30d supply, fill #0

## 2024-02-03 NOTE — Telephone Encounter (Signed)
 Wife states that patient is doing good. No leg pain, has been outside walking. Instructed to call if needed.

## 2024-02-07 ENCOUNTER — Other Ambulatory Visit: Payer: Self-pay

## 2024-02-07 ENCOUNTER — Telehealth: Payer: Self-pay | Admitting: *Deleted

## 2024-02-07 ENCOUNTER — Other Ambulatory Visit: Payer: Self-pay | Admitting: *Deleted

## 2024-02-07 DIAGNOSIS — Z961 Presence of intraocular lens: Secondary | ICD-10-CM | POA: Diagnosis not present

## 2024-02-07 DIAGNOSIS — C9000 Multiple myeloma not having achieved remission: Secondary | ICD-10-CM

## 2024-02-07 DIAGNOSIS — H353221 Exudative age-related macular degeneration, left eye, with active choroidal neovascularization: Secondary | ICD-10-CM | POA: Diagnosis not present

## 2024-02-07 DIAGNOSIS — H35033 Hypertensive retinopathy, bilateral: Secondary | ICD-10-CM | POA: Diagnosis not present

## 2024-02-07 DIAGNOSIS — H43812 Vitreous degeneration, left eye: Secondary | ICD-10-CM | POA: Diagnosis not present

## 2024-02-07 MED ORDER — LENALIDOMIDE 15 MG PO CAPS: 15.0000 mg | ORAL_CAPSULE | Freq: Every day | ORAL | 0 refills | Status: DC

## 2024-02-07 NOTE — Telephone Encounter (Signed)
refill 

## 2024-02-07 NOTE — Telephone Encounter (Signed)
 [facility] asking for refill of the Revlimid 

## 2024-02-08 ENCOUNTER — Telehealth: Payer: Self-pay | Admitting: Pharmacist

## 2024-02-08 ENCOUNTER — Other Ambulatory Visit: Payer: Self-pay

## 2024-02-08 NOTE — Telephone Encounter (Signed)
 Received the following message from Ms. Frisbee:  "For months now we have struggled to get [facility] morphine  prescription filled.  I have called a couple of pharmacies this morning, [facility] and [address].  Neither have it in stock and don't know when they will have it.  We ran out of the ones we had from community pharmacy over the weekend.  I do have some extras that we are taking now.  What do you recommend for this? is there a pharmacy you know where we can get it? or should the dr. change us  to something else?"   I called the [facility] this morning, the pharmacist told me that all strengths of the morphine  were on national back order but she checked their ordering system again and was able to see some recently restocked from a different manufacter. She said she would try ordering those for Mr. Leveque prescription.   I let Ms. Shea know that if the pharmacy is able to get those in and fill Mr. [facility] prescription she should see those prescription updates in MyChart.

## 2024-02-09 ENCOUNTER — Other Ambulatory Visit

## 2024-02-09 ENCOUNTER — Other Ambulatory Visit: Payer: Self-pay

## 2024-02-10 ENCOUNTER — Other Ambulatory Visit: Payer: Self-pay

## 2024-02-14 ENCOUNTER — Other Ambulatory Visit: Payer: Self-pay | Admitting: *Deleted

## 2024-02-14 ENCOUNTER — Encounter: Payer: Self-pay | Admitting: Oncology

## 2024-02-14 MED ORDER — OXYCODONE HCL 10 MG PO TABS: 10.0000 mg | ORAL_TABLET | ORAL | 0 refills | Status: DC | PRN

## 2024-02-19 NOTE — Progress Notes (Unsigned)
 PROVIDER NOTE: Interpretation of information contained herein should be left to medically-trained personnel. Specific patient instructions are provided elsewhere under "Patient Instructions" section of medical record. This document was created in part using AI and STT-dictation technology, any transcriptional errors that may result from this process are unintentional.  Patient: Jack [name]  Service: E/M   PCP: Rory Collard, MD  DOB: 1932-11-25  DOS: 02/20/2024  Provider: Candi Chafe, MD  MRN: [id number]  Delivery: Face-to-face  Specialty: Interventional Pain Management  Type: Established Patient  Setting: Ambulatory outpatient facility  Specialty designation: 09  Referring Prov.: Rory Collard, MD  Location: Outpatient office facility       History of present illness (HPI) Jack [name], a 89 y.o. year old male, is here today because of his Chronic bilateral thoracic back pain [M54.6, G89.29]. [name] primary complain today is No chief complaint on file.  Pertinent problems: Jack [name] has Compression fracture of T12 vertebra, sequela; Compression fracture of L1 lumbar vertebra, sequela; Numbness of foot; Sciatica; Sensory polyneuropathy; Shoulder pain; Multilevel spine pain; Compression fracture of L2 lumbar vertebra, sequela; Kappa light chain myeloma (HCC); Right flank pain; Compression fracture of thoracic vertebra, sequela; Multiple myeloma (HCC); Pelvic fracture (HCC); Collapsed vertebra, not elsewhere classified, thoracic region, subsequent encounter for fracture with routine healing; Muscle weakness; Statin myopathy; Unsteadiness on feet; Chronic pain syndrome; History of vertebral fracture; Compression fracture of L3 lumbar vertebra, sequela; Compression fracture of T11 vertebra, sequela; Pathologic lumbar vertebral fracture, sequela; Pathologic fracture of thoracic vertebrae, sequela; History of multiple myeloma; Chronic low back pain (1ry area of Pain) (Bilateral) (R>L)  w/o sciatica; History of kyphoplasty (T11, T12, L1, L2, and L3); Grade 1 (3mm) Anterolisthesis of cervical spine (C4/C5, C5/C6, C6/C7); Cancer related pain; Compression fracture of lumbar vertebra, sequela; Multiple pathological fractures, sequela; Thoracic spine pain; Lumbar spine pain; Abnormal MRI, lumbar spine (11/16/2023); Abnormal MRI, thoracic spine (11/16/2023); Collapse of thoracic vertebra (HCC); Wedge fracture of thoracic vertebra (HCC); Lumbar facet arthropathy; Lumbosacral facet arthropathy; Lumbar facet joint pain; Osteoarthritis of facet joint of lumbar spine; Thoracic facet syndrome; DDD (degenerative disc disease), thoracic; DJD (degenerative joint disease), thoracic; Thoracic facet arthropathy; Chronic thoracic back pain (Bilateral); 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  Pain Assessment: Severity of   is reported as a  /10. Location:    / . Onset:  . Quality:  . Timing:  . Modifying factor(s):  Jack [name] Vitals:  vitals were not taken for this visit.  BMI: Estimated body mass index is 25.84 kg/m as calculated from the following:   Height as of 02/02/24: 5\' 7"  (1.702 m).   Weight as of 02/02/24: 165 lb (74.8 kg).  Last encounter: 01/17/2024. Last procedure: 02/02/2024.  Reason for encounter: post-procedure evaluation and assessment.   Discussed the use of AI scribe software for clinical note transcription with the patient, who gave verbal consent to proceed.  History of Present Illness         Post-Procedure Evaluation   Inter-Laminar Thoracic Epidural Steroid Block/Injection  #2 (unable to complete secondary to severe lower extremity pressure) Laterality:  Midline Level: T12-L1  Imaging: Fluoroscopic guidance Anesthesia: Local anesthesia (1-2% Lidocaine ) Anxiolysis: None Sedation: None.  (Patient chose not to have sedation  but clearly he will need sedation for procedure.) DOS: 02/02/2024 Performed by: Candi Chafe, MD  Purpose: Diagnostic/Therapeutic Indications: Thoracic back pain, radicular pain,  with degenerative disc disease severe enough to impact quality of life or function. 1. Chronic thoracic back pain (Bilateral)   2. Multilevel spine pain   3. Thoracic spine pain   4. History of multiple myeloma   5. Compression fracture of thoracic vertebra, sequela   6. Collapse of thoracic vertebra, sequela   7. Compression fracture of T12 vertebra, sequela   8. Compression fracture of L1 lumbar vertebra, sequela   9. Multiple pathological fractures, sequela   10. Pathologic fracture of thoracic vertebrae, sequela   11. Pathologic lumbar vertebral fracture, sequela   12. Abnormal MRI, thoracic spine (11/16/2023)   13. History of kyphoplasty (T11, T12, L1, L2, and L3)   14. Chronic anticoagulation (Eliquis )    NAS-11 Pain score:   Pre-procedure: 5 /10   Post-procedure: 8 /10   Note: Patient came into the clinic today indicating new symptoms of radicular thoracic pain.  He was scheduled to have a T12-L1 lumbar epidural steroid injection but upon accessing the space and injecting the contrast, the contrast was observed to have been located in the posterior epidural space but unable to spread adequately suggesting a significant stenosis just above needle placement at T12.  The patient also experienced significant bilateral lower extremity pressure upon injecting which caused him to become restless.  For this reason the procedure was aborted and an order placed for a lumbar and thoracic CT scan to evaluate for new acute changes that may explain the changes in symptoms.  He does have a history significant for multiple myeloma with multiple vertebral body fractures and subsequent kyphoplasties.    Effectiveness:  Initial hour after procedure:   ***. Subsequent 4-6 hours post-procedure:   ***. Analgesia past  initial 6 hours:   ***. Ongoing improvement:  Analgesic:  *** Function:    ***    ROM:    ***      Pharmacotherapy Assessment   Analgesic: No chronic opioid analgesics therapy prescribed by our practice. oxycodone  IR 10 mg tablet, 2 tabs p.o. 3 times daily (# 90) (last filled on 03/11/2023) (90 MME); morphine  ER 30 mg tablet, 1 tab p.o. 3 times daily (# 90) (last filled on 03/24/2023) (90 MME) by Shellie Dials, MD (Oncology)  MME/day: 180 mg/day   Monitoring: [place] PMP: PDMP reviewed during this encounter.       Pharmacotherapy: No side-effects or adverse reactions reported. Compliance: No problems identified. Effectiveness: Clinically acceptable.  No notes on file  No results found for: "CBDTHCR" No results found for: "D8THCCBX" No results found for: "D9THCCBX"  UDS:  No results found for: "SUMMARY"   ROS  Constitutional: Denies any fever or chills Gastrointestinal: No reported hemesis, hematochezia, vomiting, or acute GI distress Musculoskeletal: Denies any acute onset joint swelling, redness, loss of ROM, or weakness Neurological: No reported episodes of acute onset apraxia, aphasia, dysarthria, agnosia, amnesia, paralysis, loss of coordination, or loss of consciousness  Medication Review  Alpha-Lipoic Acid, Multiple Vitamins-Minerals, Oxycodone  HCl, Oyster Shell Calcium  w/D, apixaban , carvedilol , cyanocobalamin , dexamethasone , gabapentin , ibuprofen , lenalidomide , lidocaine , morphine , multivitamin with minerals, naloxone , omeprazole , polyethylene glycol, potassium citrate , senna-docusate, and vitamin C  History Review  Allergy: Jack [name] is allergic to quinolones, amlodipine , azithromycin , lipitor [atorvastatin ], lisinopril , zetia  [ezetimibe ], and hydromorphone  hcl. Drug: Jack [name]  reports no history of drug use. Alcohol:  reports that he does not currently use alcohol after a past usage of about 1.0 standard drink of alcohol per week. Tobacco:  reports that he quit  smoking about 58 years  ago. His smoking use included cigarettes. He uses smokeless tobacco. Social: Jack [name]  reports that he quit smoking about 58 years ago. His smoking use included cigarettes. He uses smokeless tobacco. He reports that he does not currently use alcohol after a past usage of about 1.0 standard drink of alcohol per week. He reports that he does not use drugs. Medical:  has a past medical history of Ascending aortic aneurysm (HCC), CKD (chronic kidney disease), stage III (HCC), Compression fracture of body of thoracic vertebra (HCC), Diastolic dysfunction, Elevated prostate specific antigen (PSA), GERD (gastroesophageal reflux disease), History of colon polyps (2008), History of kidney stones, Hyperlipidemia, Hypertension, Myeloma (HCC), PAF (paroxysmal atrial fibrillation) (HCC), Pain, and Prostate hypertrophy. Surgical: Jack [name]  has a past surgical history that includes Resection soft tissue tumor leg / ankle radical (jan 2009); Small intestine surgery (1946); Cystoscopy w/ ureteral stent placement (Right, 10/16/2017); Kidney stone surgery; Colon surgery; Tonsillectomy; Cataract extraction w/PHACO (Left, 01/10/2018); Cataract extraction w/PHACO (Right, 01/25/2018); Extracorporeal shock wave lithotripsy (Right, 12/11/2020); Cystoscopy/ureteroscopy/holmium laser/stent placement (Right, 12/16/2020); IR KYPHO LUMBAR INC FX REDUCE BONE BX UNI/BIL CANNULATION INC/IMAGING (02/02/2021); IR KYPHO EA ADDL LEVEL THORACIC OR LUMBAR (02/02/2021); Kyphoplasty (N/A, 03/12/2021); IR KYPHO THORACIC WITH BONE BIOPSY (04/15/2022); and IR Bone Tumor(s)RF Ablation (04/16/2022). Family: family history includes Cancer in his sister; Hyperlipidemia in his father; Hypertension in his father.  Laboratory Chemistry Profile   Renal Lab Results  Component Value Date   BUN 29 (H) 01/30/2024   CREATININE 1.27 (H) 01/30/2024   LABCREA 96.68 02/03/2021   BCR 12 02/24/2021   GFR 59.90 (L) 09/24/2013   GFRAA 29 (L) 10/16/2017    GFRNONAA 54 (L) 01/30/2024    Hepatic Lab Results  Component Value Date   AST 17 01/30/2024   ALT 10 01/30/2024   ALBUMIN 3.5 01/30/2024   ALKPHOS 97 01/30/2024   LIPASE 32 03/26/2021    Electrolytes Lab Results  Component Value Date   NA 135 01/30/2024   K 4.3 01/30/2024   CL 101 01/30/2024   CALCIUM  8.6 (L) 01/30/2024   MG 2.1 04/06/2023    Bone Lab Results  Component Value Date   VD25OH 28 (L) 08/23/2013    Inflammation (CRP: Acute Phase) (ESR: Chronic Phase) Lab Results  Component Value Date   CRP 1.5 (H) 06/21/2023   ESRSEDRATE 40 (H) 06/21/2023   LATICACIDVEN 1.8 04/03/2023         Note: Above Lab results reviewed.  Recent Imaging Review  CT THORACIC SPINE WO CONTRAST CLINICAL DATA:  Mid back pain. Osteoarthritis, thoracic. Acute bilateral lower extremity pain status post attempted T12-L1 epidural steroid injection.  EXAM: CT THORACIC AND LUMBAR SPINE WITHOUT CONTRAST  TECHNIQUE: Multidetector CT imaging of the thoracic and lumbar spine was performed without contrast. Multiplanar CT image reconstructions were also generated.  RADIATION DOSE REDUCTION: This exam was performed according to the departmental dose-optimization program which includes automated exposure control, adjustment of the mA and/or kV according to patient size and/or use of iterative reconstruction technique.  COMPARISON:  MRI thoracic and lumbar spine 10/31/2023. CTA chest 05/02/2023.  FINDINGS: CT THORACIC SPINE FINDINGS  Alignment: Trace anterolisthesis of C5 on C6.  Vertebrae: Marked diffuse osteopenia. Previously augmented T11 and T12 compression fractures. No acute fracture or destructive process.  Paraspinal and other soft tissues: Aortic and coronary atherosclerosis. Partially visualized ascending aortic aneurysm with maximal transverse diameter of 4.8 cm of the included portion on today's study, similar to the 05/02/2023 CT. Small sliding hiatal hernia.  Disc  levels: Mild multilevel disc degeneration without evidence of high-grade degenerative spinal canal stenosis. Mild retropulsion of the T12 superior endplate is unchanged and results in at most mild spinal stenosis.  CT LUMBAR SPINE FINDINGS  Segmentation: 5 lumbar type vertebrae.  Alignment: Unchanged trace anterolisthesis of L4 on L5.  Vertebrae: Marked diffuse osteopenia. Previously augmented L1, L2, and L3 compression fractures. Unchanged mild L4 and L5 vertebral body height loss. No acute fracture or destructive process.  Paraspinal and other soft tissues: Abdominal aortic atherosclerosis without aneurysm. Partially visualized small hyperdense foci within the right renal collecting system which may reflect excreted contrast or nonobstructing calculi.  Disc levels: Dorsal subdural contrast collection containing a small amount of gas consistent with the provided history of a recent attempted epidural injection, with this collection extending from T12-L2, measuring 1.1 x 2.0 x 7.0 cm (AP x transverse x craniocaudal), and moderately to severely effacing the subarachnoid space at the T12 and L1 levels (with the conus medullaris terminating at L1-2 based on the prior MRI). Subtle subarachnoid contrast in the upper lumbar and lower thoracic spine.  Mild spinal stenosis at L2-3 and moderate spinal stenosis at L3-4 due to disc bulging and posterior element hypertrophy, similar to the prior MRI. Chronic mild multilevel neural foraminal stenosis.  IMPRESSION: 1. Dorsal subdural contrast collection from T12-L2 moderately to severely effacing the subarachnoid space (at the level of the distal spinal cord and conus based on the prior MRI). Minimal subarachnoid contrast. 2. Multiple chronic compression fractures. No acute osseous abnormality in the thoracic or lumbar spine. 3. 4.8 cm ascending aortic aneurysm, similar to 05/02/2023. Recommend semi-annual imaging followup by CTA or MRA  and referral to cardiothoracic surgery if not already obtained. This recommendation follows 2010 ACCF/AHA/AATS/ACR/ASA/SCA/SCAI/SIR/STS/SVM Guidelines for the Diagnosis and Management of Patients With Thoracic Aortic Disease. Circulation. 2010; 121: Z610-R604. Aortic aneurysm NOS (ICD10-I71.9) 4.  Aortic Atherosclerosis (ICD10-I70.0).  Electronically Signed   By: Aundra Lee M.D.   On: 02/02/2024 12:15 CT LUMBAR SPINE WO CONTRAST CLINICAL DATA:  Mid back pain. Osteoarthritis, thoracic. Acute bilateral lower extremity pain status post attempted T12-L1 epidural steroid injection.  EXAM: CT THORACIC AND LUMBAR SPINE WITHOUT CONTRAST  TECHNIQUE: Multidetector CT imaging of the thoracic and lumbar spine was performed without contrast. Multiplanar CT image reconstructions were also generated.  RADIATION DOSE REDUCTION: This exam was performed according to the departmental dose-optimization program which includes automated exposure control, adjustment of the mA and/or kV according to patient size and/or use of iterative reconstruction technique.  COMPARISON:  MRI thoracic and lumbar spine 10/31/2023. CTA chest 05/02/2023.  FINDINGS: CT THORACIC SPINE FINDINGS  Alignment: Trace anterolisthesis of C5 on C6.  Vertebrae: Marked diffuse osteopenia. Previously augmented T11 and T12 compression fractures. No acute fracture or destructive process.  Paraspinal and other soft tissues: Aortic and coronary atherosclerosis. Partially visualized ascending aortic aneurysm with maximal transverse diameter of 4.8 cm of the included portion on today's study, similar to the 05/02/2023 CT. Small sliding hiatal hernia.  Disc levels: Mild multilevel disc degeneration without evidence of high-grade degenerative spinal canal stenosis. Mild retropulsion of the T12 superior endplate is unchanged and results in at most mild spinal stenosis.  CT LUMBAR SPINE FINDINGS  Segmentation: 5 lumbar type  vertebrae.  Alignment: Unchanged trace anterolisthesis of L4 on L5.  Vertebrae: Marked diffuse osteopenia. Previously augmented L1, L2, and L3 compression fractures. Unchanged mild L4 and L5 vertebral body height loss. No acute fracture or destructive process.  Paraspinal and other soft  tissues: Abdominal aortic atherosclerosis without aneurysm. Partially visualized small hyperdense foci within the right renal collecting system which may reflect excreted contrast or nonobstructing calculi.  Disc levels: Dorsal subdural contrast collection containing a small amount of gas consistent with the provided history of a recent attempted epidural injection, with this collection extending from T12-L2, measuring 1.1 x 2.0 x 7.0 cm (AP x transverse x craniocaudal), and moderately to severely effacing the subarachnoid space at the T12 and L1 levels (with the conus medullaris terminating at L1-2 based on the prior MRI). Subtle subarachnoid contrast in the upper lumbar and lower thoracic spine.  Mild spinal stenosis at L2-3 and moderate spinal stenosis at L3-4 due to disc bulging and posterior element hypertrophy, similar to the prior MRI. Chronic mild multilevel neural foraminal stenosis.  IMPRESSION: 1. Dorsal subdural contrast collection from T12-L2 moderately to severely effacing the subarachnoid space (at the level of the distal spinal cord and conus based on the prior MRI). Minimal subarachnoid contrast. 2. Multiple chronic compression fractures. No acute osseous abnormality in the thoracic or lumbar spine. 3. 4.8 cm ascending aortic aneurysm, similar to 05/02/2023. Recommend semi-annual imaging followup by CTA or MRA and referral to cardiothoracic surgery if not already obtained. This recommendation follows 2010 ACCF/AHA/AATS/ACR/ASA/SCA/SCAI/SIR/STS/SVM Guidelines for the Diagnosis and Management of Patients With Thoracic Aortic Disease. Circulation. 2010; 121: Z610-R604. Aortic  aneurysm NOS (ICD10-I71.9) 4.  Aortic Atherosclerosis (ICD10-I70.0).  Electronically Signed   By: Aundra Lee M.D.   On: 02/02/2024 12:15 DG PAIN CLINIC C-ARM 1-60 MIN NO REPORT Fluoro was used, but no Radiologist interpretation will be provided.  Please refer to "NOTES" tab for provider progress note. Note: Reviewed         Physical Exam  General appearance: Well nourished, well developed, and well hydrated. In no apparent acute distress Mental status: Alert, oriented x 3 (person, place, & time)       Respiratory: No evidence of acute respiratory distress Eyes: PERLA Vitals: There were no vitals taken for this visit. BMI: Estimated body mass index is 25.84 kg/m as calculated from the following:   Height as of 02/02/24: 5\' 7"  (1.702 m).   Weight as of 02/02/24: 165 lb (74.8 kg). Ideal: Patient weight not recorded  Assessment   Diagnosis Status  1. Chronic thoracic back pain (Bilateral)   2. Multilevel spine pain   3. Thoracic spine pain   4. History of multiple myeloma   5. Postop check    Controlled Controlled Controlled   Updated Problems: No problems updated.  Plan of Care  Problem-specific:  Assessment and Plan            Jack [name] has a current medication list which includes the following long-term medication(s): oyster shell calcium  w/d, carvedilol , eliquis , gabapentin , and omeprazole .  Pharmacotherapy (Medications Ordered): No orders of the defined types were placed in this encounter.  Orders:  No orders of the defined types were placed in this encounter.    Interventional Therapies  Risk Factors  Considerations  Comorbidities ELIQUIS  Anticoagulation (Stop: 3 days  Restart: 6 hours)  Multiple Myeloma  polypharmacy  High Dose opioid Therapy  Stage3b CKD  ascending aortic aneurysm  diastolic dysfunction  GERD  A-fib  nephrolithiasis  HTN  MV insufficiency     Planned  Pending:      Under consideration:   (02/02/2024) No  further interventions using the epidural space.  See 02/02/2024 note and results of lumbar and thoracic CT scans.   Completed:  Diagnostic bilateral thoracic facet MBB (T11-L2) x1 (12/22/2023) (80/80/50/50)  Diagnostic/therapeutic midline T12-L1 thoracolumbar LESI x1, (11/22/2023) (100/50/80/80 x 2.5 weeks)  Therapeutic midline T12-L1 TESI #2 (02/02/2024) (Aborted.  See procedure note.)   Therapeutic  Palliative (PRN) options:   None established   Completed by other providers:   None reported     No follow-ups on file.    Recent Visits Date Type Provider Dept  02/02/24 Procedure visit Renaldo Caroli, MD [facility]  01/17/24 Office Visit Renaldo Caroli, MD [facility]  12/22/23 Procedure visit Renaldo Caroli, MD [facility]  12/13/23 Office Visit Renaldo Caroli, MD [facility]  11/22/23 Procedure visit Renaldo Caroli, MD [facility]  Showing recent visits within past 90 days and meeting all other requirements Future Appointments Date Type Provider Dept  02/20/24 Appointment Renaldo Caroli, MD [facility]  Showing future appointments within next 90 days and meeting all other requirements  I discussed the assessment and treatment plan with the patient. The patient was provided an opportunity to ask questions and all were answered. The patient agreed with the plan and demonstrated an understanding of the instructions.  Patient advised to call back or seek an in-person evaluation if the symptoms or condition worsens.  Duration of encounter: *** minutes.  Total time on encounter, as per AMA guidelines included both the face-to-face and non-face-to-face time personally spent by the physician and/or other qualified health care professional(s) on the day of the encounter (includes time in activities that require the physician or other qualified health care professional and does not include time in activities  normally performed by clinical staff). Physician's time may include the following activities when performed: Preparing to see the patient (e.g., pre-charting review of records, searching for previously ordered imaging, lab work, and nerve conduction tests) Review of prior analgesic pharmacotherapies. Reviewing PMP Interpreting ordered tests (e.g., lab work, imaging, nerve conduction tests) Performing post-procedure evaluations, including interpretation of diagnostic procedures Obtaining and/or reviewing separately obtained history Performing a medically appropriate examination and/or evaluation Counseling and educating the patient/family/caregiver Ordering medications, tests, or procedures Referring and communicating with other health care professionals (when not separately reported) Documenting clinical information in the electronic or other health record Independently interpreting results (not separately reported) and communicating results to the patient/ family/caregiver Care coordination (not separately reported)  Note by: Candi Chafe, MD (TTS and AI technology used. I apologize for any typographical errors that were not detected and corrected.) Date: 02/20/2024; Time: 8:51 AM

## 2024-02-20 ENCOUNTER — Ambulatory Visit: Attending: Pain Medicine | Admitting: Pain Medicine

## 2024-02-20 ENCOUNTER — Other Ambulatory Visit: Payer: Self-pay

## 2024-02-20 ENCOUNTER — Encounter: Payer: Self-pay | Admitting: Pain Medicine

## 2024-02-20 ENCOUNTER — Other Ambulatory Visit: Payer: Self-pay | Admitting: *Deleted

## 2024-02-20 VITALS — BP 155/80 | HR 52 | Temp 97.3°F | Resp 14 | Ht 66.0 in | Wt 168.0 lb

## 2024-02-20 DIAGNOSIS — Z09 Encounter for follow-up examination after completed treatment for conditions other than malignant neoplasm: Secondary | ICD-10-CM | POA: Insufficient documentation

## 2024-02-20 DIAGNOSIS — Z8579 Personal history of other malignant neoplasms of lymphoid, hematopoietic and related tissues: Secondary | ICD-10-CM | POA: Insufficient documentation

## 2024-02-20 DIAGNOSIS — G8929 Other chronic pain: Secondary | ICD-10-CM | POA: Insufficient documentation

## 2024-02-20 DIAGNOSIS — M549 Dorsalgia, unspecified: Secondary | ICD-10-CM | POA: Diagnosis not present

## 2024-02-20 DIAGNOSIS — C9000 Multiple myeloma not having achieved remission: Secondary | ICD-10-CM

## 2024-02-20 DIAGNOSIS — M546 Pain in thoracic spine: Secondary | ICD-10-CM | POA: Insufficient documentation

## 2024-02-21 ENCOUNTER — Inpatient Hospital Stay: Attending: Oncology

## 2024-02-21 DIAGNOSIS — D696 Thrombocytopenia, unspecified: Secondary | ICD-10-CM | POA: Insufficient documentation

## 2024-02-21 DIAGNOSIS — D649 Anemia, unspecified: Secondary | ICD-10-CM | POA: Insufficient documentation

## 2024-02-21 DIAGNOSIS — C9000 Multiple myeloma not having achieved remission: Secondary | ICD-10-CM | POA: Diagnosis not present

## 2024-02-21 LAB — CMP (CANCER CENTER ONLY)
ALT: 10 U/L (ref 0–44)
AST: 19 U/L (ref 15–41)
Albumin: 3.5 g/dL (ref 3.5–5.0)
Alkaline Phosphatase: 95 U/L (ref 38–126)
Anion gap: 6 (ref 5–15)
BUN: 27 mg/dL — ABNORMAL HIGH (ref 8–23)
CO2: 28 mmol/L (ref 22–32)
Calcium: 8.7 mg/dL — ABNORMAL LOW (ref 8.9–10.3)
Chloride: 102 mmol/L (ref 98–111)
Creatinine: 1.24 mg/dL (ref 0.61–1.24)
GFR, Estimated: 56 mL/min — ABNORMAL LOW (ref >60)
Glucose, Bld: 172 mg/dL — ABNORMAL HIGH (ref 70–99)
Potassium: 4.6 mmol/L (ref 3.5–5.1)
Sodium: 136 mmol/L (ref 135–145)
Total Bilirubin: 0.8 mg/dL (ref 0.0–1.2)
Total Protein: 5.9 g/dL — ABNORMAL LOW (ref 6.5–8.1)

## 2024-02-21 LAB — CBC WITH DIFFERENTIAL/PLATELET
Abs Immature Granulocytes: 0.02 10*3/uL (ref 0.00–0.07)
Basophils Absolute: 0 10*3/uL (ref 0.0–0.1)
Basophils Relative: 1 %
Eosinophils Absolute: 0.1 10*3/uL (ref 0.0–0.5)
Eosinophils Relative: 3 %
HCT: 38.7 % — ABNORMAL LOW (ref 39.0–52.0)
Hemoglobin: 12.2 g/dL — ABNORMAL LOW (ref 13.0–17.0)
Immature Granulocytes: 1 %
Lymphocytes Relative: 20 %
Lymphs Abs: 0.7 10*3/uL (ref 0.7–4.0)
MCH: 33.2 pg (ref 26.0–34.0)
MCHC: 31.5 g/dL (ref 30.0–36.0)
MCV: 105.2 fL — ABNORMAL HIGH (ref 80.0–100.0)
Monocytes Absolute: 0.1 10*3/uL (ref 0.1–1.0)
Monocytes Relative: 4 %
Neutro Abs: 2.6 10*3/uL (ref 1.7–7.7)
Neutrophils Relative %: 71 %
Platelets: 50 10*3/uL — ABNORMAL LOW (ref 150–400)
RBC: 3.68 MIL/uL — ABNORMAL LOW (ref 4.22–5.81)
RDW: 16.5 % — ABNORMAL HIGH (ref 11.5–15.5)
WBC: 3.6 10*3/uL — ABNORMAL LOW (ref 4.0–10.5)
nRBC: 0 % (ref 0.0–0.2)

## 2024-02-22 ENCOUNTER — Inpatient Hospital Stay: Admitting: Hospice and Palliative Medicine

## 2024-02-22 DIAGNOSIS — G893 Neoplasm related pain (acute) (chronic): Secondary | ICD-10-CM | POA: Diagnosis not present

## 2024-02-22 DIAGNOSIS — C9000 Multiple myeloma not having achieved remission: Secondary | ICD-10-CM | POA: Diagnosis not present

## 2024-02-22 LAB — KAPPA/LAMBDA LIGHT CHAINS
Kappa free light chain: 144.1 mg/L — ABNORMAL HIGH (ref 3.3–19.4)
Kappa, lambda light chain ratio: 7.87 — ABNORMAL HIGH (ref 0.26–1.65)
Lambda free light chains: 18.3 mg/L (ref 5.7–26.3)

## 2024-02-22 NOTE — Progress Notes (Signed)
 Virtual Visit via Telephone Note  I connected with Jack [name] on 02/22/24 at  3:00 PM EDT by telephone and verified that I am speaking with the correct person using two identifiers.  Location: Patient: Home Provider: Clinic   I discussed the limitations, risks, security and privacy concerns of performing an evaluation and management service by telephone and the availability of in person appointments. I also discussed with the patient that there may be a patient responsible charge related to this service. The patient expressed understanding and agreed to proceed.   History of Present Illness: Jack [name] is a 89 y.o. male with multiple medical problems including hypertension, CKD stage IIIb, A. fib on Eliquis , anemia, and refractory multiple myeloma.  Patient has history of pathologic compression fractures with T11 kyphoplasty and ablation.  He has had chronic back pain.  Observations/Objective: Spoke with patient and wife by phone.    Patient continues to endorse chronic back pain.  Has been followed by pain management with previous steroid injections.  Patient says he would like to return to see Dr. Alvira Josephs to see if there are any other interventional procedures available.   Patient continues to take MS Contin /oxycodone .  Denies any adverse effects from pain medications.  Performance status is stable.  Patient still exercising on the treadmill daily.  Assessment and Plan: Chronic back pain secondary to multiple myeloma and history of recurrent/chronic compression fractures.  S/p XRT.  Continue MS Contin /oxycodone .  Referral to Dr. Alvira Josephs (IR) per patient request  Neuropathy -Continue gabapentin .   Follow Up Instructions: Follow-up telephone visit 1 to 2 months   I discussed the assessment and treatment plan with the patient. The patient was provided an opportunity to ask questions and all were answered. The patient agreed with the plan and demonstrated an understanding  of the instructions.   The patient was advised to call back or seek an in-person evaluation if the symptoms worsen or if the condition fails to improve as anticipated.  I provided 10 minutes of non-face-to-face time during this encounter.   Peggyann Bower, NP

## 2024-02-23 ENCOUNTER — Other Ambulatory Visit: Payer: Self-pay | Admitting: *Deleted

## 2024-02-23 DIAGNOSIS — G893 Neoplasm related pain (acute) (chronic): Secondary | ICD-10-CM

## 2024-02-23 DIAGNOSIS — C9000 Multiple myeloma not having achieved remission: Secondary | ICD-10-CM

## 2024-02-27 ENCOUNTER — Telehealth (HOSPITAL_COMMUNITY): Payer: Self-pay

## 2024-02-27 ENCOUNTER — Other Ambulatory Visit

## 2024-02-27 ENCOUNTER — Inpatient Hospital Stay: Admitting: Pharmacist

## 2024-02-27 DIAGNOSIS — C9000 Multiple myeloma not having achieved remission: Secondary | ICD-10-CM | POA: Diagnosis not present

## 2024-02-27 NOTE — Telephone Encounter (Signed)
 In regards to KP referral: I reviewed the case with Dev. He will not proceed, as suspected. He indeed does not treat chronic fractures...   Lambert Pillion, PA-C.   Message was relayed to ordering physician. AB

## 2024-02-27 NOTE — Telephone Encounter (Signed)
 Pt's wife returned call. Made her aware that Dr. Alvira Josephs does not treat chronic fractures. She thanked me for letting her know. AB

## 2024-02-27 NOTE — Telephone Encounter (Signed)
 Called pt's wife regarding KP referral, no answer, no vm. AB   Will try back later.

## 2024-02-29 MED ORDER — LENALIDOMIDE 15 MG PO CAPS: 15.0000 mg | ORAL_CAPSULE | Freq: Every day | ORAL | 0 refills | Status: DC

## 2024-02-29 NOTE — Progress Notes (Signed)
 Oral Chemotherapy Clinic [facility]  [phone] [phone]  Patient Care Team: Rory Collard, MD as PCP - General (Family Medicine) End, Veryl Gottron, MD as PCP - Cardiology (Cardiology) Randye Buttner, MD (Endocrinology) Shellie Dials, MD as Consulting Physician (Hematology and Oncology)   Name of the patient: Jack [name]  [id number]  May 17, 1933   Date of visit: 02/27/24  HPI: Patient is a 89 y.o. male with newly diagnosed multiple myeloma. Currently treated with Revlimid  (lenalidomide ) and dexamethasone . Patient was initiated on an all oral regimen because he was unable to physically make it into clinic at the time treatment due to his disease/performance status. His status improved he was able to come back to inperson appts on 07/14/21. Lenalidomide  was dose reduced to 15 mg daily 21on/7off on 11/07/23 due to renal function.   Reason for Consult: Oral chemotherapy follow-up for lenalidomide  therapy.   PAST MEDICAL HISTORY: Past Medical History:  Diagnosis Date   Ascending aortic aneurysm (HCC)    a. 12/2020 Echo: Asc Ao 48mm, Ao root 40mm. b. 03/2022 Asc Ao 4.6 cm (4.5 cm in 2019)   CKD (chronic kidney disease), stage III (HCC)    Compression fracture of body of thoracic vertebra (HCC)    Diastolic dysfunction    a. 12/2020 Echo: EF 50-55%, no rwma, mild LVH, GrI DD, nl RV fxn, mild AI. Asc Ao 48mm, Ao root 40mm.   Elevated prostate specific antigen (PSA)    has been 7 for a year    GERD (gastroesophageal reflux disease)    History of colon polyps 2008   [facility],    History of kidney stones    Hyperlipidemia    Hypertension    Myeloma (HCC)    PAF (paroxysmal atrial fibrillation) (HCC)    a. 01/2021-->Eliquis  (CHA2DS2VASc = 3-4).   Pain    Prostate hypertrophy    diagnosed at age 74 due to hematospermia    HEMATOLOGY/ONCOLOGY HISTORY:  Oncology History  Kappa light chain myeloma (HCC)  03/12/2021 Initial Diagnosis    Kappa light chain myeloma (HCC)   03/27/2021 - 04/06/2021 Chemotherapy   Patient is on Treatment Plan : MYELOMA NON-TRANSPLANT CANDIDATES VRd SQ q21d      04/16/2021 Cancer Staging   Staging form: Plasma Cell Myeloma and Plasma Cell Disorders, AJCC 8th Edition - Clinical stage from 04/16/2021: Albumin (g/dL): 3.8, ISS: Stage II, High-risk cytogenetics: Absent, LDH: Unknown - Signed by Shellie Dials, MD on 04/16/2021 Albumin range (g/dL): Greater than or equal to 3.5 Cytogenetics: t(11;14) translocation Serum calcium  level: Normal Serum creatinine level: Normal Bone disease on imaging: Present     ALLERGIES:  is allergic to quinolones, amlodipine , azithromycin , lipitor [atorvastatin ], lisinopril , zetia  [ezetimibe ], and hydromorphone  hcl.  MEDICATIONS:  Current Outpatient Medications  Medication Sig Dispense Refill   Alpha-Lipoic Acid 600 MG CAPS Take by mouth.     Ascorbic Acid  (VITAMIN C) 1000 MG tablet Take 1,000 mg by mouth in the morning and at bedtime.     Calcium  Carb-Cholecalciferol  (OYSTER SHELL CALCIUM  W/D) 500-5 MG-MCG TABS Take 1 tablet by mouth in the morning and at bedtime.     carvedilol  (COREG ) 12.5 MG tablet Take 0.5 tablets (6.25 mg total) by mouth 2 (two) times daily. 180 tablet 3   cyanocobalamin  (VITAMIN B12) 1000 MCG tablet Take 1,000 mcg by mouth daily.     dexamethasone  (DECADRON ) 4 MG tablet TAKE 5 TABLETS BY MOUTH ONCE A WEEK. 60 tablet 2   ELIQUIS  5 MG TABS  tablet TAKE 1 TABLET BY MOUTH TWICE A DAY 180 tablet 0   gabapentin  (NEURONTIN ) 300 MG capsule Take 2 capsules (600 mg total) by mouth 3 (three) times daily. 180 capsule 2   ibuprofen  (ADVIL ) 200 MG tablet Take 200 mg by mouth 2 (two) times daily as needed.     lenalidomide  (REVLIMID ) 15 MG capsule Take 1 capsule (15 mg total) by mouth daily. [employer] Auth # [id number]     Date Obtained 01/09/2024 Take 1 capsule by mouth once daily for 21 days, then 7 days off. 21 capsule 0   lidocaine  (XYLOCAINE ) 5 % ointment  APPLY TOPICALLY 3 (THREE) TIMES DAILY AS NEEDED FOR MILD PAIN OR MODERATE PAIN. 50 g 2   morphine  (MS CONTIN ) 15 MG 12 hr tablet Take 2 tablets (30 mg total) by mouth every 8 (eight) hours. 180 tablet 0   morphine  (MS CONTIN ) 30 MG 12 hr tablet Take 1 tablet (30 mg total) by mouth every 8 (eight) hours. 90 tablet 0   Multiple Vitamin (MULTIVITAMIN WITH MINERALS) TABS tablet Take 1 tablet by mouth daily.     Multiple Vitamins-Minerals (PRESERVISION AREDS PO) Take 1 capsule by mouth in the morning and at bedtime.     naloxone  (NARCAN ) nasal spray 4 mg/0.1 mL SPRAY 1 SPRAY INTO ONE NOSTRIL AS DIRECTED FOR OPIOID OVERDOSE (TURN PERSON ON SIDE AFTER DOSE. IF NO RESPONSE IN 2-3 MINUTES OR PERSON RESPONDS BUT RELAPSES, REPEAT USING A NEW SPRAY DEVICE AND SPRAY INTO THE OTHER NOSTRIL. CALL 911 AFTER USE.) * EMERGENCY USE ONLY * 1 each 0   omeprazole  (PRILOSEC) 20 MG capsule TAKE 1 CAPSULE BY MOUTH EVERY DAY 90 capsule 2   Oxycodone  HCl 10 MG TABS Take 1 tablet (10 mg total) by mouth every 4 (four) hours as needed (pain). 90 tablet 0   polyethylene glycol (MIRALAX  / GLYCOLAX ) 17 g packet Take 17 g by mouth 2 (two) times daily.  0   potassium citrate  (UROCIT-K ) 10 MEQ (1080 MG) SR tablet TAKE 1 TABLET (10 MEQ TOTAL) BY MOUTH 3 (THREE) TIMES DAILY WITH MEALS. 300 tablet 2   senna-docusate (SENOKOT-S) 8.6-50 MG tablet Take 1 tablet by mouth 2 (two) times daily. 30 tablet 0   No current facility-administered medications for this visit.    VITAL SIGNS: BP 134/71   Pulse (!) 50   Temp (!) 96.1 F (35.6 C) (Tympanic)   Wt 75.7 kg (166 lb 12.8 oz)   BMI 26.92 kg/m  Filed Weights   02/27/24 1417  Weight: 75.7 kg (166 lb 12.8 oz)     Estimated body mass index is 26.92 kg/m as calculated from the following:   Height as of 02/20/24: 5' 6 (1.676 m).   Weight as of this encounter: 75.7 kg (166 lb 12.8 oz).  LABS: CBC:    Component Value Date/Time   WBC 3.6 (L) 02/21/2024 0951   HGB 12.2 (L) 02/21/2024  0951   HGB 13.1 12/05/2023 1409   HGB 11.0 (L) 02/24/2021 1014   HCT 38.7 (L) 02/21/2024 0951   HCT 35.3 (L) 02/24/2021 1014   PLT 50 (L) 02/21/2024 0951   PLT 71 (L) 12/05/2023 1409   PLT 249 02/24/2021 1014   MCV 105.2 (H) 02/21/2024 0951   MCV 96 02/24/2021 1014   MCV 90 01/24/2013 0819   NEUTROABS 2.6 02/21/2024 0951   NEUTROABS 7.4 (H) 01/24/2013 0819   LYMPHSABS 0.7 02/21/2024 0951   LYMPHSABS 1.2 01/24/2013 0819   MONOABS 0.1 02/21/2024 0272  MONOABS 0.3 01/24/2013 0819   EOSABS 0.1 02/21/2024 0951   EOSABS 0.0 01/24/2013 0819   BASOSABS 0.0 02/21/2024 0951   BASOSABS 0.0 01/24/2013 0819   Comprehensive Metabolic Panel:    Component Value Date/Time   NA 136 02/21/2024 0951   NA 136 02/24/2021 1014   NA 138 01/24/2013 0819   K 4.6 02/21/2024 0951   K 3.8 01/24/2013 0819   CL 102 02/21/2024 0951   CL 105 01/24/2013 0819   CO2 28 02/21/2024 0951   CO2 25 01/24/2013 0819   BUN 27 (H) 02/21/2024 0951   BUN 23 02/24/2021 1014   BUN 20 (H) 01/24/2013 0819   CREATININE 1.24 02/21/2024 0951   CREATININE 1.52 (H) 01/24/2013 0819   GLUCOSE 172 (H) 02/21/2024 0951   GLUCOSE 171 (H) 01/24/2013 0819   CALCIUM  8.7 (L) 02/21/2024 0951   CALCIUM  10.4 (H) 02/19/2021 1229   AST 19 02/21/2024 0951   ALT 10 02/21/2024 0951   ALT 15 01/24/2013 0819   ALKPHOS 95 02/21/2024 0951   ALKPHOS 74 01/24/2013 0819   BILITOT 0.8 02/21/2024 0951   PROT 5.9 (L) 02/21/2024 0951   PROT 6.2 02/24/2021 1014   PROT 6.3 (L) 01/24/2013 0819   ALBUMIN 3.5 02/21/2024 0951   ALBUMIN 4.5 02/24/2021 1014   ALBUMIN 3.6 01/24/2013 0819     Present during today's visit: Patient and his wife Jack [name]  Assessment and Plan: CBC/CMP reviewed with the [facility], continue lenalidomide  15 mg PO daily, 21 days on/7 off and dexamethasone  20 mg weekly.  Light chains checked on 02/21/24 and have decreased from previous lab check, will continue to monitor light chains Overall patient is doing well, despite his  back pain. Patient saw Lilian Register, NP on 02/22/24 to discuss pain management plans.    Oral Chemotherapy Adherence: no reported missed doses.  No patient barriers to medication adherence identified.   New medications: none reported  Medication Access Issues: no issues, fills Revlimid  at [facility], refill sent in today  Patient expressed understanding and was in agreement with this plan. He also understands that He can call clinic at any time with any questions, concerns, or complaints.   Follow-up plan: RTC in 4 weeks  Thank you for allowing me to participate in the care of this very pleasant patient.   Time Total: 15 mins  Visit consisted of counseling and education on dealing with issues of symptom management in the setting of serious and potentially life-threatening illness.Greater than 50%  of this time was spent counseling and coordinating care related to the above assessment and plan.  Signed by: Lyndell Allaire N. Ronnae Kaser, PharmD, Lorraine Roses, CPP Hematology/Oncology Clinical Pharmacist Practitioner [facility] [phone]

## 2024-03-01 ENCOUNTER — Telehealth: Payer: Self-pay | Admitting: Oncology

## 2024-03-01 NOTE — Telephone Encounter (Signed)
 Left Vm that 7/21 lab needs to be rescheduled 1 week prior to MD appt on 7/24. Asked to return call to office.

## 2024-03-02 ENCOUNTER — Encounter: Payer: Self-pay | Admitting: Pharmacist

## 2024-03-02 NOTE — Telephone Encounter (Signed)
 Opened in errror

## 2024-03-02 NOTE — Telephone Encounter (Signed)
 Encounter opened in error

## 2024-03-05 ENCOUNTER — Encounter: Payer: Self-pay | Admitting: Oncology

## 2024-03-06 DIAGNOSIS — H353221 Exudative age-related macular degeneration, left eye, with active choroidal neovascularization: Secondary | ICD-10-CM | POA: Diagnosis not present

## 2024-03-07 ENCOUNTER — Ambulatory Visit: Attending: Internal Medicine | Admitting: Internal Medicine

## 2024-03-07 ENCOUNTER — Encounter: Payer: Self-pay | Admitting: Internal Medicine

## 2024-03-07 VITALS — BP 138/82 | HR 57 | Ht 68.0 in | Wt 164.0 lb

## 2024-03-07 DIAGNOSIS — I10 Essential (primary) hypertension: Secondary | ICD-10-CM

## 2024-03-07 DIAGNOSIS — I480 Paroxysmal atrial fibrillation: Secondary | ICD-10-CM

## 2024-03-07 DIAGNOSIS — I7121 Aneurysm of the ascending aorta, without rupture: Secondary | ICD-10-CM

## 2024-03-07 DIAGNOSIS — C9000 Multiple myeloma not having achieved remission: Secondary | ICD-10-CM

## 2024-03-07 DIAGNOSIS — I4719 Other supraventricular tachycardia: Secondary | ICD-10-CM | POA: Insufficient documentation

## 2024-03-07 DIAGNOSIS — I471 Supraventricular tachycardia, unspecified: Secondary | ICD-10-CM | POA: Diagnosis not present

## 2024-03-07 NOTE — Progress Notes (Signed)
 Cardiology Office Note:  .   Date:  03/07/2024  ID:  Jack [name], DOB Nov 05, 1932, MRN [id number] PCP: Jack Lenis, MD  [facility] Providers Cardiologist:  Jack Hanson, MD     History of Present Illness: .   Jack [name] is a 89 y.o. male with history of paroxysmal atrial fibrillation, PSVT, PACs, MAT, coronary artery calcium , ascending aortic aneurysm (followed by CT surgery), hypertension, hyperlipidemia, multiple myeloma, chronic kidney disease, GERD, BPH, and remote tobacco abuse, who presents for follow-up of atrial fibrillation.  He was last seen in our office in 02/2023, at which time it he was doing well from a heart standpoint.  He continued to note significant back pain and fatigue thought to be due to his myeloma.  No medication changes or additional testing were pursued.  Today, [name] reports that he has continued to do well from a heart standpoint, denying chest pain, shortness of breath, palpitations, lightheadedness, and edema.  His main complaint remains chronic back pain related to his multiple myeloma.  He tries to walk with a cane or walker when the weather allows.  He has not had any significant bleeding but bruises easily and also has skin tears on his arms at times.  He has not fallen in over a year.  Home blood pressures are typically in the 130-140/70-80 range at home.  ROS: See HPI  Studies Reviewed: SABRA   EKG Interpretation Date/Time:  Wednesday March 07 2024 09:10:36 EDT Ventricular Rate:  57 PR Interval:  174 QRS Duration:  108 QT Interval:  448 QTC Calculation: 436 R Axis:   -46  Text Interpretation: Sinus bradycardia Left anterior fascicular block Moderate voltage criteria for LVH, may be normal variant ( R in aVL , Cornell product ) Nonspecific ST abnormality When compared with ECG of 04-Mar-2023 No significant change was found Confirmed by Jack [name], Jack [phone]) on 03/07/2024 9:34:13 AM    Risk Assessment/Calculations:     CHA2DS2-VASc Score = 4   This indicates a 4.8% annual risk of stroke. The patient's score is based upon: CHF History: 0 HTN History: 1 Diabetes History: 0 Stroke History: 0 Vascular Disease History: 1 Age Score: 2 Gender Score: 0            Physical Exam:   VS:  BP 138/82 (BP Location: Left Arm)   Pulse (!) 57   Ht 5' 8 (1.727 m)   Wt 164 lb (74.4 kg)   SpO2 96%   BMI 24.94 kg/m    Wt Readings from Last 3 Encounters:  03/07/24 164 lb (74.4 kg)  02/27/24 166 lb 12.8 oz (75.7 kg)  02/20/24 168 lb (76.2 kg)    General:  NAD. Neck: No JVD or HJR. Lungs: Clear to auscultation bilaterally without wheezes or crackles. Heart: Regular rate and rhythm without murmurs, rubs, or gallops. Abdomen: Soft, nontender, nondistended. Extremities: No lower extremity edema.  Forearm ecchymoses noted bilaterally.  ASSESSMENT AND PLAN: .    Paroxysmal atrial fibrillation, PSVT, and MAT: Jack [name] remains asymptomatic without palpitations or lightheadedness.  EKG today shows mild sinus bradycardia.  We will continue current dose of carvedilol  12.5 mg twice daily as well as apixaban  5 mg twice daily as long as his platelets do not drop any further (platelet count 50,000 on last check earlier this month).  Continue monthly CBCs through Jack [name].  Ascending aortic aneurysm: No symptoms.  Previously evaluated by cardiac surgery with unchanged ascending aorta measuring 4.7 cm noted on the  last scan in 04/2023.  Continue blood pressure control and follow-up with thoracic surgery as previously arranged.  Defer statin therapy again given reasonable LDL of 87 on the last check in 08/2022 and previous statin intolerance.  Consider repeating lipid panel at next lab draw with Jack [name] or Dr. Glover.  Hypertension: Initial blood pressure moderately elevated, improved but still borderline on recheck.  Home blood pressures typically better.  Continue current dose of carvedilol  and ongoing home  monitoring.  Multiple myeloma: Continue close monitoring and lenalidomide  therapy, as well as pain control, per Jack [name].    Dispo: Return to clinic in 1 year.  Signed, Jack Hanson, MD

## 2024-03-07 NOTE — Patient Instructions (Signed)
 Medication Instructions:  Your physician recommends that you continue on your current medications as directed. Please refer to the Current Medication list given to you today.   *If you need a refill on your cardiac medications before your next appointment, please call your pharmacy*  Lab Work: No labs ordered today   Testing/Procedures: No test ordered today   Follow-Up: At [facility], you and your health needs are our priority.  As part of our continuing mission to provide you with exceptional heart care, our providers are all part of one team.  This team includes your primary Cardiologist (physician) and Advanced Practice Providers or APPs (Physician Assistants and Nurse Practitioners) who all work together to provide you with the care you need, when you need it.  Your next appointment:   1 year(s)  Provider:   You may see Sammy Crisp, MD or one of the following Advanced Practice Providers on your designated Care Team:   Laneta Pintos, NP Gildardo Labrador, PA-C Varney Gentleman, PA-C Cadence [place], PA-C Ronald Cockayne, NP Morey Ar, NP

## 2024-03-09 ENCOUNTER — Other Ambulatory Visit: Payer: Self-pay | Admitting: Oncology

## 2024-03-09 DIAGNOSIS — C9000 Multiple myeloma not having achieved remission: Secondary | ICD-10-CM

## 2024-03-11 ENCOUNTER — Encounter: Payer: Self-pay | Admitting: Oncology

## 2024-03-12 ENCOUNTER — Other Ambulatory Visit: Payer: Self-pay | Admitting: *Deleted

## 2024-03-12 DIAGNOSIS — C9000 Multiple myeloma not having achieved remission: Secondary | ICD-10-CM

## 2024-03-12 MED ORDER — LIDOCAINE 5 % EX OINT: TOPICAL_OINTMENT | Freq: Three times a day (TID) | CUTANEOUS | 2 refills | Status: DC | PRN

## 2024-03-15 ENCOUNTER — Encounter: Payer: Self-pay | Admitting: Oncology

## 2024-03-15 ENCOUNTER — Other Ambulatory Visit: Payer: Self-pay | Admitting: *Deleted

## 2024-03-15 MED ORDER — OXYCODONE HCL 10 MG PO TABS
10.0000 mg | ORAL_TABLET | ORAL | 0 refills | Status: DC | PRN
Start: 2024-03-15 — End: 2024-04-23

## 2024-03-20 ENCOUNTER — Other Ambulatory Visit: Payer: Self-pay

## 2024-03-20 ENCOUNTER — Encounter: Payer: Self-pay | Admitting: Oncology

## 2024-03-20 ENCOUNTER — Other Ambulatory Visit: Payer: Self-pay | Admitting: *Deleted

## 2024-03-20 MED ORDER — MORPHINE SULFATE ER 15 MG PO TBCR
30.0000 mg | EXTENDED_RELEASE_TABLET | Freq: Three times a day (TID) | ORAL | 0 refills | Status: DC
  Filled 2024-03-20: qty 180, 30d supply, fill #0

## 2024-03-26 ENCOUNTER — Ambulatory Visit: Admitting: Oncology

## 2024-03-26 ENCOUNTER — Other Ambulatory Visit

## 2024-03-29 ENCOUNTER — Other Ambulatory Visit

## 2024-04-02 ENCOUNTER — Inpatient Hospital Stay: Attending: Oncology

## 2024-04-02 ENCOUNTER — Other Ambulatory Visit

## 2024-04-02 ENCOUNTER — Ambulatory Visit: Admitting: Oncology

## 2024-04-02 DIAGNOSIS — N289 Disorder of kidney and ureter, unspecified: Secondary | ICD-10-CM | POA: Insufficient documentation

## 2024-04-02 DIAGNOSIS — C9000 Multiple myeloma not having achieved remission: Secondary | ICD-10-CM | POA: Insufficient documentation

## 2024-04-02 LAB — CBC WITH DIFFERENTIAL/PLATELET
Abs Immature Granulocytes: 0.01 K/uL (ref 0.00–0.07)
Basophils Absolute: 0 K/uL (ref 0.0–0.1)
Basophils Relative: 1 %
Eosinophils Absolute: 0.2 K/uL (ref 0.0–0.5)
Eosinophils Relative: 7 %
HCT: 40 % (ref 39.0–52.0)
Hemoglobin: 12.9 g/dL — ABNORMAL LOW (ref 13.0–17.0)
Immature Granulocytes: 0 %
Lymphocytes Relative: 29 %
Lymphs Abs: 0.8 K/uL (ref 0.7–4.0)
MCH: 33.8 pg (ref 26.0–34.0)
MCHC: 32.3 g/dL (ref 30.0–36.0)
MCV: 104.7 fL — ABNORMAL HIGH (ref 80.0–100.0)
Monocytes Absolute: 0.3 K/uL (ref 0.1–1.0)
Monocytes Relative: 11 %
Neutro Abs: 1.4 K/uL — ABNORMAL LOW (ref 1.7–7.7)
Neutrophils Relative %: 52 %
Platelets: 37 K/uL — ABNORMAL LOW (ref 150–400)
RBC: 3.82 MIL/uL — ABNORMAL LOW (ref 4.22–5.81)
RDW: 15.6 % — ABNORMAL HIGH (ref 11.5–15.5)
WBC: 2.7 K/uL — ABNORMAL LOW (ref 4.0–10.5)
nRBC: 0 % (ref 0.0–0.2)

## 2024-04-02 LAB — CMP (CANCER CENTER ONLY)
ALT: 12 U/L (ref 0–44)
AST: 16 U/L (ref 15–41)
Albumin: 3.8 g/dL (ref 3.5–5.0)
Alkaline Phosphatase: 89 U/L (ref 38–126)
Anion gap: 9 (ref 5–15)
BUN: 30 mg/dL — ABNORMAL HIGH (ref 8–23)
CO2: 24 mmol/L (ref 22–32)
Calcium: 8.8 mg/dL — ABNORMAL LOW (ref 8.9–10.3)
Chloride: 105 mmol/L (ref 98–111)
Creatinine: 1.36 mg/dL — ABNORMAL HIGH (ref 0.61–1.24)
GFR, Estimated: 50 mL/min — ABNORMAL LOW (ref >60)
Glucose, Bld: 161 mg/dL — ABNORMAL HIGH (ref 70–99)
Potassium: 3.7 mmol/L (ref 3.5–5.1)
Sodium: 138 mmol/L (ref 135–145)
Total Bilirubin: 1 mg/dL (ref 0.0–1.2)
Total Protein: 5.7 g/dL — ABNORMAL LOW (ref 6.5–8.1)

## 2024-04-03 ENCOUNTER — Telehealth: Payer: Self-pay | Admitting: *Deleted

## 2024-04-03 LAB — KAPPA/LAMBDA LIGHT CHAINS
Kappa free light chain: 190.8 mg/L — ABNORMAL HIGH (ref 3.3–19.4)
Kappa, lambda light chain ratio: 10.72 — ABNORMAL HIGH (ref 0.26–1.65)
Lambda free light chains: 17.8 mg/L (ref 5.7–26.3)

## 2024-04-03 NOTE — Telephone Encounter (Signed)
 Needs lenalidomide  21 tablets  days and 1 week off.

## 2024-04-04 ENCOUNTER — Other Ambulatory Visit: Payer: Self-pay

## 2024-04-04 DIAGNOSIS — C9000 Multiple myeloma not having achieved remission: Secondary | ICD-10-CM

## 2024-04-04 MED ORDER — LENALIDOMIDE 15 MG PO CAPS: 15.0000 mg | ORAL_CAPSULE | Freq: Every day | ORAL | 0 refills | Status: DC

## 2024-04-04 NOTE — Telephone Encounter (Signed)
 Rx sent.

## 2024-04-05 ENCOUNTER — Ambulatory Visit: Admitting: Oncology

## 2024-04-09 DIAGNOSIS — H353231 Exudative age-related macular degeneration, bilateral, with active choroidal neovascularization: Secondary | ICD-10-CM | POA: Diagnosis not present

## 2024-04-10 ENCOUNTER — Encounter: Payer: Self-pay | Admitting: Oncology

## 2024-04-10 ENCOUNTER — Inpatient Hospital Stay (HOSPITAL_BASED_OUTPATIENT_CLINIC_OR_DEPARTMENT_OTHER): Admitting: Oncology

## 2024-04-10 VITALS — BP 110/62 | HR 44 | Temp 97.8°F | Resp 16 | Ht 68.0 in | Wt 166.2 lb

## 2024-04-10 DIAGNOSIS — C9000 Multiple myeloma not having achieved remission: Secondary | ICD-10-CM | POA: Diagnosis not present

## 2024-04-10 NOTE — Progress Notes (Unsigned)
 Patient would like to discuss going up on Revlimid  due to his numbers decreasing. He is sleeping more during the day.

## 2024-04-10 NOTE — Progress Notes (Unsigned)
 [name]  Telephone:([phone]) [phone] Fax:(336) [phone]  ID: Jack [name] OB: [birth year]  MR#: [id number]  RDW#:[id number]  Patient Care Team: Jack Lenis, MD as PCP - General (Family Medicine) [name], Lonni, MD as PCP - Cardiology (Cardiology) Jack Lavenia SQUIBB, MD (Endocrinology) Jack Evalene PARAS, MD as Consulting Physician (Oncology)   CHIEF COMPLAINT: Stage II kappa chain myeloma.  INTERVAL HISTORY: Patient returns to clinic today for repeat laboratory, further evaluation, and continuation of Revlimid .  He continues to have significant fatigue, but otherwise feels well.  He has chronic back pain that is well-controlled with his current narcotic regimen.  He otherwise feels well.  He is tolerating Revlimid  without significant side effects. He has no neurologic complaints.  He denies any fevers.  He has a good appetite and denies weight loss.  He denies chest pain, shortness of breath, cough, or hemoptysis.  He denies any nausea, vomiting, constipation, or diarrhea.  He has no urinary complaints.  Patient offers no further specific complaints today.  REVIEW OF SYSTEMS:   Review of Systems  Constitutional:  Positive for malaise/fatigue. Negative for fever.  Respiratory: Negative.  Negative for cough, hemoptysis and shortness of breath.   Cardiovascular: Negative.  Negative for chest pain and leg swelling.  Gastrointestinal: Negative.  Negative for abdominal pain.  Genitourinary: Negative.  Negative for dysuria and flank pain.  Musculoskeletal:  Positive for back pain. Negative for falls and joint pain.  Skin: Negative.  Negative for rash.  Neurological: Negative.  Negative for dizziness, focal weakness, weakness and headaches.  Psychiatric/Behavioral: Negative.  The patient is not nervous/anxious.     As per HPI. Otherwise, a complete review of systems is negative.  PAST MEDICAL HISTORY: Past Medical History:  Diagnosis Date   Ascending aortic  aneurysm (HCC)    a. 12/2020 Echo: Asc Ao 48mm, Ao root 40mm. b. 03/2022 Asc Ao 4.6 cm (4.5 cm in 2019)   CKD (chronic kidney disease), stage III (HCC)    Compression fracture of body of thoracic vertebra (HCC)    Diastolic dysfunction    a. 12/2020 Echo: EF 50-55%, no rwma, mild LVH, GrI DD, nl RV fxn, mild AI. Asc Ao 48mm, Ao root 40mm.   Elevated prostate specific antigen (PSA)    has been 7 for a year    GERD (gastroesophageal reflux disease)    History of colon polyps 2008   [facility],    History of kidney stones    Hyperlipidemia    Hypertension    Myeloma (HCC)    PAF (paroxysmal atrial fibrillation) (HCC)    a. 01/2021-->Eliquis  (CHA2DS2VASc = 3-4).   Pain    Prostate hypertrophy    diagnosed at age 38 due to hematospermia    PAST SURGICAL HISTORY: Past Surgical History:  Procedure Laterality Date   CATARACT EXTRACTION W/PHACO Left 01/10/2018   Procedure: CATARACT EXTRACTION PHACO AND INTRAOCULAR LENS PLACEMENT (IOC);  Surgeon: Jack Fallow, MD;  Location: [facility] ORS;  Service: Ophthalmology;  Laterality: Left;  US  00:24.8 AP% 14.9 CDE 3.68 Fluid pack lot # [id number]   CATARACT EXTRACTION W/PHACO Right 01/25/2018   Procedure: CATARACT EXTRACTION PHACO AND INTRAOCULAR LENS PLACEMENT (IOC);  Surgeon: Jack Fallow, MD;  Location: [facility];  Service: Ophthalmology;  Laterality: Right;  US  00:42 AP% 10.8 CDE 4.59 Fluid pack lot # [id number]   COLON SURGERY     CYSTOSCOPY W/ URETERAL STENT PLACEMENT Right 10/16/2017   Procedure: right  URETERAL STENT PLACEMENT,cystoscopy bilateral stent removal,rretrograde;  Surgeon: Jack,  Jack BROCKS, MD;  Location: [facility];  Service: Urology;  Laterality: Right;   CYSTOSCOPY/URETEROSCOPY/HOLMIUM LASER/STENT PLACEMENT Right 12/16/2020   Procedure: CYSTOSCOPY/URETEROSCOPY/HOLMIUM LASER/STENT PLACEMENT;  Surgeon: Jack Jack BROCKS, MD;  Location: [facility];  Service: Urology;  Laterality: Right;   EXTRACORPOREAL SHOCK WAVE LITHOTRIPSY Right  12/11/2020   Procedure: EXTRACORPOREAL SHOCK WAVE LITHOTRIPSY (ESWL);  Surgeon: Jack Jack BROCKS, MD;  Location: [facility];  Service: Urology;  Laterality: Right;   IR BONE TUMOR(S)RF ABLATION  04/16/2022   IR KYPHO EA ADDL LEVEL THORACIC OR LUMBAR  02/02/2021   IR KYPHO LUMBAR INC FX REDUCE BONE BX UNI/BIL CANNULATION INC/IMAGING  02/02/2021   IR KYPHO THORACIC WITH BONE BIOPSY  04/15/2022   KIDNEY STONE SURGERY     KYPHOPLASTY N/A 03/12/2021   Procedure: Jack [name], L3;  Surgeon: Jack Sharper, MD;  Location: [facility];  Service: Orthopedics;  Laterality: N/A;   RESECTION SOFT TISSUE TUMOR LEG / ANKLE RADICAL  jan 2009   Jack [name],  right thigh/knee , nonmalignant   SMALL INTESTINE SURGERY  1946   implaed on picket fence, punctured stomach   TONSILLECTOMY      FAMILY HISTORY: Family History  Problem Relation Age of Onset   Hypertension Father    Hyperlipidemia Father    Cancer Sister        thyroid  - dx in late 20's    ADVANCED DIRECTIVES (Y/N):  N  HEALTH MAINTENANCE: Social History   Tobacco Use   Smoking status: Former    Current packs/day: 0.00    Types: Cigarettes    Quit date: 07/05/1965    Years since quitting: 58.8   Smokeless tobacco: Current   Tobacco comments:    Occasional cigar  Vaping Use   Vaping status: Never Used  Substance Use Topics   Alcohol use: Not Currently    Alcohol/week: 1.0 standard drink of alcohol    Types: 1 Standard drinks or equivalent per week    Comment: occassionaly   Drug use: No     Colonoscopy:  PAP:  Bone density:  Lipid panel:  Allergies  Allergen Reactions   Quinolones     Aortic dilation contraindicates FQ   Amlodipine  Swelling and Other (See Comments)    LE edema   Azithromycin  Nausea And Vomiting   Lipitor [Atorvastatin ]     Muscle pain in RT Leg    Lisinopril  Cough   Zetia  [Ezetimibe ]     Pain in legs    Hydromorphone  Hcl     Wife states Medication makes extremely sedated and lethargic    Current Outpatient  Medications  Medication Sig Dispense Refill   Alpha-Lipoic Acid 600 MG CAPS Take by mouth.     Ascorbic Acid  (VITAMIN C) 1000 MG tablet Take 1,000 mg by mouth in the morning and at bedtime.     Calcium  Carb-Cholecalciferol  (OYSTER SHELL CALCIUM  W/D) 500-5 MG-MCG TABS Take 1 tablet by mouth in the morning and at bedtime.     carvedilol  (COREG ) 12.5 MG tablet Take 0.5 tablets (6.25 mg total) by mouth 2 (two) times daily. 180 tablet 3   cyanocobalamin  (VITAMIN B12) 1000 MCG tablet Take 1,000 mcg by mouth daily.     dexamethasone  (DECADRON ) 4 MG tablet TAKE 5 TABLETS BY MOUTH ONCE A WEEK. 60 tablet 2   ELIQUIS  5 MG TABS tablet TAKE 1 TABLET BY MOUTH TWICE A DAY 180 tablet 0   gabapentin  (NEURONTIN ) 300 MG capsule TAKE 2 CAPSULES BY MOUTH 3 TIMES DAILY. 180 capsule 2  ibuprofen  (ADVIL ) 200 MG tablet Take 200 mg by mouth 2 (two) times daily as needed.     lenalidomide  (REVLIMID ) 15 MG capsule Take 1 capsule (15 mg total) by mouth daily. Take for 21 days, then hold for 7 days. Repeat every 28 days. 21 capsule 0   lidocaine  (XYLOCAINE ) 5 % ointment Apply topically 3 (three) times daily as needed for mild pain (pain score 1-3) or moderate pain (pain score 4-6). 50 g 2   morphine  (MS CONTIN ) 15 MG 12 hr tablet Take 2 tablets (30 mg total) by mouth every 8 (eight) hours. 180 tablet 0   morphine  (MS CONTIN ) 30 MG 12 hr tablet Take 1 tablet (30 mg total) by mouth every 8 (eight) hours. 90 tablet 0   Multiple Vitamin (MULTIVITAMIN WITH MINERALS) TABS tablet Take 1 tablet by mouth daily.     Multiple Vitamins-Minerals (PRESERVISION AREDS PO) Take 1 capsule by mouth in the morning and at bedtime.     naloxone  (NARCAN ) nasal spray 4 mg/0.1 mL SPRAY 1 SPRAY INTO ONE NOSTRIL AS DIRECTED FOR OPIOID OVERDOSE (TURN PERSON ON SIDE AFTER DOSE. IF NO RESPONSE IN 2-3 MINUTES OR PERSON RESPONDS BUT RELAPSES, REPEAT USING A NEW SPRAY DEVICE AND SPRAY INTO THE OTHER NOSTRIL. CALL 911 AFTER USE.) * EMERGENCY USE ONLY * 1 each 0    omeprazole  (PRILOSEC) 20 MG capsule TAKE 1 CAPSULE BY MOUTH EVERY DAY 90 capsule 2   Oxycodone  HCl 10 MG TABS Take 1 tablet (10 mg total) by mouth every 4 (four) hours as needed (pain). 90 tablet 0   polyethylene glycol (MIRALAX  / GLYCOLAX ) 17 g packet Take 17 g by mouth 2 (two) times daily.  0   potassium citrate  (UROCIT-K ) 10 MEQ (1080 MG) SR tablet TAKE 1 TABLET (10 MEQ TOTAL) BY MOUTH 3 (THREE) TIMES DAILY WITH MEALS. 300 tablet 2   senna-docusate (SENOKOT-S) 8.6-50 MG tablet Take 1 tablet by mouth 2 (two) times daily. 30 tablet 0   No current facility-administered medications for this visit.    OBJECTIVE: Vitals:   04/10/24 1108  BP: 110/62  Pulse: (!) 44  Resp: 16  Temp: 97.8 F (36.6 C)  SpO2: 95%       Body mass index is 25.27 kg/m.    ECOG FS:1 - Symptomatic but completely ambulatory  General: Well-developed, well-nourished, no acute distress. Eyes: Pink conjunctiva, anicteric sclera. HEENT: Normocephalic, moist mucous membranes. Lungs: No audible wheezing or coughing. Heart: Regular rate and rhythm. Abdomen: Soft, nontender, no obvious distention. Musculoskeletal: No edema, cyanosis, or clubbing. Neuro: Alert, answering all questions appropriately. Cranial nerves grossly intact. Skin: No rashes or petechiae noted. Psych: Normal affect.  LAB RESULTS:  Lab Results  Component Value Date   NA 138 04/02/2024   K 3.7 04/02/2024   CL 105 04/02/2024   CO2 24 04/02/2024   GLUCOSE 161 (H) 04/02/2024   BUN 30 (H) 04/02/2024   CREATININE 1.36 (H) 04/02/2024   CALCIUM  8.8 (L) 04/02/2024   PROT 5.7 (L) 04/02/2024   ALBUMIN 3.8 04/02/2024   AST 16 04/02/2024   ALT 12 04/02/2024   ALKPHOS 89 04/02/2024   BILITOT 1.0 04/02/2024   GFRNONAA 50 (L) 04/02/2024   GFRAA 29 (L) 10/16/2017    Lab Results  Component Value Date   WBC 2.7 (L) 04/02/2024   NEUTROABS 1.4 (L) 04/02/2024   HGB 12.9 (L) 04/02/2024   HCT 40.0 04/02/2024   MCV 104.7 (H) 04/02/2024   PLT 37  (L) 04/02/2024  STUDIES: No results found.   ASSESSMENT: Stage II Kappa chain myeloma.  PLAN:    Stage II kappa chain myeloma: (11:14 translocation, high risk) SPEP essentially negative and immunoglobulins are within normal limits.  Patient's kappa light chains initially improved from greater than 5400 down to 39.1.  PET scan results from December 08, 2021 reviewed independently with no obvious evidence of progressive disease.  Nuclear med bone scan results from December 28, 2022 did not reveal any concerning pathology.  Patient was off Revlimid  for approximately 4 months given admission for sepsis and abscess and extended antibiotic treatment.  His kappa chains have increased to 190.8.  Xgeva  has been discontinued permanently secondary to dental issues.  Patient was initially supposed to start his next cycle of Revlimid  today at 15 mg 21 days on 7 days off, but will hold treatment 1 week secondary to increased fatigue and thrombocytopenia.  Return to clinic in 1 week for reevaluation, consideration of changing Revlimid  dosing, and reinitiation of treatment.  Anemia: Likely improved.  Patient's hemoglobin is 12.9 today.   Leukopenia: Decreased.  Patient's total white blood cell count has declined to 2.7.  Hold Revlimid  1 week as above. Thrombocytopenia: Platelets have trended down to 37.  Hold Revlimid  as above. Bone lesions: Recent nuclear med bone scan as above with no obvious evidence of progressive disease.  Xgeva  has been discontinued. Pain: Continue injections as per pain clinic.  Nuclear medicine bone scan completed on December 28, 2022 did not report any active areas of myeloma.  Patient only takes long-acting morphine  30 mg every 8 hours.  He also takes Tylenol  and ibuprofen  sparingly.  Previously he was instructed that if his platelets fall below 50 this would need to be discontinued.  Radiation oncology has determined no further treatments are needed at this time.   Renal insufficiency:  Creatinine slightly worse today at 1.36.  Monitor.  Patient expressed understanding and was in agreement with this plan. He also understands that He can call clinic at any time with any questions, concerns, or complaints.     Cancer Staging  Kappa light chain myeloma (HCC) Staging form: Plasma Cell Myeloma and Plasma Cell Disorders, AJCC 8th Edition - Clinical stage from 04/16/2021: Albumin (g/dL): 3.8, ISS: Stage II, High-risk cytogenetics: Absent, LDH: Unknown - Signed by Jack Evalene PARAS, MD on 04/16/2021 Albumin range (g/dL): Greater than or equal to 3.5 Cytogenetics: t(11;14) translocation Serum calcium  level: Normal Serum creatinine level: Normal Bone disease on imaging: Present  Evalene [name] Jacobo, MD   04/11/2024 8:29 AM

## 2024-04-11 ENCOUNTER — Encounter: Payer: Self-pay | Admitting: Oncology

## 2024-04-12 ENCOUNTER — Encounter: Payer: Self-pay | Admitting: Oncology

## 2024-04-13 ENCOUNTER — Other Ambulatory Visit: Payer: Self-pay | Admitting: *Deleted

## 2024-04-13 DIAGNOSIS — C9000 Multiple myeloma not having achieved remission: Secondary | ICD-10-CM

## 2024-04-13 MED ORDER — LIDOCAINE 5 % EX OINT: TOPICAL_OINTMENT | Freq: Three times a day (TID) | CUTANEOUS | 2 refills | Status: DC | PRN

## 2024-04-17 ENCOUNTER — Inpatient Hospital Stay: Admitting: Oncology

## 2024-04-17 ENCOUNTER — Encounter: Payer: Self-pay | Admitting: *Deleted

## 2024-04-17 ENCOUNTER — Encounter: Payer: Self-pay | Admitting: Oncology

## 2024-04-17 ENCOUNTER — Inpatient Hospital Stay: Attending: Oncology

## 2024-04-17 ENCOUNTER — Inpatient Hospital Stay: Admitting: Pharmacist

## 2024-04-17 ENCOUNTER — Other Ambulatory Visit: Payer: Self-pay

## 2024-04-17 VITALS — BP 142/90 | HR 49 | Temp 97.4°F | Resp 16 | Ht 68.0 in | Wt 168.0 lb

## 2024-04-17 DIAGNOSIS — M899 Disorder of bone, unspecified: Secondary | ICD-10-CM | POA: Insufficient documentation

## 2024-04-17 DIAGNOSIS — D72819 Decreased white blood cell count, unspecified: Secondary | ICD-10-CM | POA: Diagnosis not present

## 2024-04-17 DIAGNOSIS — C9000 Multiple myeloma not having achieved remission: Secondary | ICD-10-CM | POA: Diagnosis not present

## 2024-04-17 DIAGNOSIS — Z79899 Other long term (current) drug therapy: Secondary | ICD-10-CM | POA: Diagnosis not present

## 2024-04-17 DIAGNOSIS — D696 Thrombocytopenia, unspecified: Secondary | ICD-10-CM | POA: Insufficient documentation

## 2024-04-17 DIAGNOSIS — F17290 Nicotine dependence, other tobacco product, uncomplicated: Secondary | ICD-10-CM | POA: Insufficient documentation

## 2024-04-17 DIAGNOSIS — M549 Dorsalgia, unspecified: Secondary | ICD-10-CM | POA: Insufficient documentation

## 2024-04-17 DIAGNOSIS — G8929 Other chronic pain: Secondary | ICD-10-CM | POA: Insufficient documentation

## 2024-04-17 DIAGNOSIS — R5382 Chronic fatigue, unspecified: Secondary | ICD-10-CM | POA: Diagnosis not present

## 2024-04-17 DIAGNOSIS — N289 Disorder of kidney and ureter, unspecified: Secondary | ICD-10-CM | POA: Insufficient documentation

## 2024-04-17 DIAGNOSIS — D649 Anemia, unspecified: Secondary | ICD-10-CM | POA: Insufficient documentation

## 2024-04-17 LAB — CMP (CANCER CENTER ONLY)
ALT: 9 U/L (ref 0–44)
AST: 17 U/L (ref 15–41)
Albumin: 3.6 g/dL (ref 3.5–5.0)
Alkaline Phosphatase: 95 U/L (ref 38–126)
Anion gap: 7 (ref 5–15)
BUN: 29 mg/dL — ABNORMAL HIGH (ref 8–23)
CO2: 27 mmol/L (ref 22–32)
Calcium: 9 mg/dL (ref 8.9–10.3)
Chloride: 104 mmol/L (ref 98–111)
Creatinine: 1.43 mg/dL — ABNORMAL HIGH (ref 0.61–1.24)
GFR, Estimated: 47 mL/min — ABNORMAL LOW (ref >60)
Glucose, Bld: 133 mg/dL — ABNORMAL HIGH (ref 70–99)
Potassium: 4.2 mmol/L (ref 3.5–5.1)
Sodium: 138 mmol/L (ref 135–145)
Total Bilirubin: 0.6 mg/dL (ref 0.0–1.2)
Total Protein: 5.7 g/dL — ABNORMAL LOW (ref 6.5–8.1)

## 2024-04-17 LAB — CBC WITH DIFFERENTIAL (CANCER CENTER ONLY)
Abs Immature Granulocytes: 0.01 K/uL (ref 0.00–0.07)
Basophils Absolute: 0 K/uL (ref 0.0–0.1)
Basophils Relative: 1 %
Eosinophils Absolute: 0.1 K/uL (ref 0.0–0.5)
Eosinophils Relative: 2 %
HCT: 39.2 % (ref 39.0–52.0)
Hemoglobin: 12.7 g/dL — ABNORMAL LOW (ref 13.0–17.0)
Immature Granulocytes: 0 %
Lymphocytes Relative: 18 %
Lymphs Abs: 0.7 K/uL (ref 0.7–4.0)
MCH: 33.2 pg (ref 26.0–34.0)
MCHC: 32.4 g/dL (ref 30.0–36.0)
MCV: 102.6 fL — ABNORMAL HIGH (ref 80.0–100.0)
Monocytes Absolute: 0.3 K/uL (ref 0.1–1.0)
Monocytes Relative: 7 %
Neutro Abs: 2.7 K/uL (ref 1.7–7.7)
Neutrophils Relative %: 72 %
Platelet Count: 21 K/uL — ABNORMAL LOW (ref 150–400)
RBC: 3.82 MIL/uL — ABNORMAL LOW (ref 4.22–5.81)
RDW: 15.5 % (ref 11.5–15.5)
WBC Count: 3.8 K/uL — ABNORMAL LOW (ref 4.0–10.5)
nRBC: 0 % (ref 0.0–0.2)

## 2024-04-17 NOTE — Progress Notes (Unsigned)
 Patient is doing about the same as he did last week.

## 2024-04-17 NOTE — Progress Notes (Signed)
 Treatment on hold, not seen by CPP

## 2024-04-18 LAB — KAPPA/LAMBDA LIGHT CHAINS
Kappa free light chain: 187.6 mg/L — ABNORMAL HIGH (ref 3.3–19.4)
Kappa, lambda light chain ratio: 14 — ABNORMAL HIGH (ref 0.26–1.65)
Lambda free light chains: 13.4 mg/L (ref 5.7–26.3)

## 2024-04-19 ENCOUNTER — Encounter: Payer: Self-pay | Admitting: Oncology

## 2024-04-19 NOTE — Progress Notes (Signed)
 [facility]  Telephone:([phone]) [phone] Fax:(336) [phone]  ID: Jack [name] OB: December 31, 1932  MR#: [id number]  RDW#:[id number]  Patient Care Team: Glover Lenis, MD as PCP - General (Family Medicine) End, Lonni, MD as PCP - Cardiology (Cardiology) Jana Lavenia SQUIBB, MD (Endocrinology) Jacobo Evalene PARAS, MD as Consulting Physician (Oncology)   CHIEF COMPLAINT: Stage II kappa chain myeloma.  INTERVAL HISTORY: Patient returns to clinic today for repeat laboratory work, further evaluation, and consideration of reinitiating Revlimid .  He continues to have chronic fatigue, but otherwise feels well.  He also has chronic back pain, but this is well-controlled with his current narcotic regimen. He has no neurologic complaints.  He denies any recent fevers or illnesses.  He has a good appetite and denies weight loss.  He denies chest pain, shortness of breath, cough, or hemoptysis.  He denies any nausea, vomiting, constipation, or diarrhea.  He has no urinary complaints.  Patient offers no further specific complaints today.  REVIEW OF SYSTEMS:   Review of Systems  Constitutional:  Positive for malaise/fatigue. Negative for fever.  Respiratory: Negative.  Negative for cough, hemoptysis and shortness of breath.   Cardiovascular: Negative.  Negative for chest pain and leg swelling.  Gastrointestinal: Negative.  Negative for abdominal pain.  Genitourinary: Negative.  Negative for dysuria and flank pain.  Musculoskeletal:  Positive for back pain. Negative for falls and joint pain.  Skin: Negative.  Negative for rash.  Neurological: Negative.  Negative for dizziness, focal weakness, weakness and headaches.  Psychiatric/Behavioral: Negative.  The patient is not nervous/anxious.     As per HPI. Otherwise, a complete review of systems is negative.  PAST MEDICAL HISTORY: Past Medical History:  Diagnosis Date   Ascending aortic aneurysm (HCC)    a. 12/2020 Echo: Asc Ao  48mm, Ao root 40mm. b. 03/2022 Asc Ao 4.6 cm (4.5 cm in 2019)   CKD (chronic kidney disease), stage III (HCC)    Compression fracture of body of thoracic vertebra (HCC)    Diastolic dysfunction    a. 12/2020 Echo: EF 50-55%, no rwma, mild LVH, GrI DD, nl RV fxn, mild AI. Asc Ao 48mm, Ao root 40mm.   Elevated prostate specific antigen (PSA)    has been 7 for a year    GERD (gastroesophageal reflux disease)    History of colon polyps 2008   [facility],    History of kidney stones    Hyperlipidemia    Hypertension    Myeloma (HCC)    PAF (paroxysmal atrial fibrillation) (HCC)    a. 01/2021-->Eliquis  (CHA2DS2VASc = 3-4).   Pain    Prostate hypertrophy    diagnosed at age 71 due to hematospermia    PAST SURGICAL HISTORY: Past Surgical History:  Procedure Laterality Date   CATARACT EXTRACTION W/PHACO Left 01/10/2018   Procedure: CATARACT EXTRACTION PHACO AND INTRAOCULAR LENS PLACEMENT (IOC);  Surgeon: Jaye Fallow, MD;  Location: [facility] ORS;  Service: Ophthalmology;  Laterality: Left;  US  00:24.8 AP% 14.9 CDE 3.68 Fluid pack lot # [id number]   CATARACT EXTRACTION W/PHACO Right 01/25/2018   Procedure: CATARACT EXTRACTION PHACO AND INTRAOCULAR LENS PLACEMENT (IOC);  Surgeon: Jaye Fallow, MD;  Location: [facility] OR[facility];  Service: Ophthalmology;  Laterality: Right;  US  00:42 AP% 10.8 CDE 4.59 Fluid pack lot # [id number]   COLON SURGERY     CYSTOSCOPY W/ URETERAL STENT PLACEMENT Right 10/16/2017   Procedure: right  URETERAL STENT PLACEMENT,cystoscopy bilateral stent removal,rretrograde;  Surgeon: Twylla Glendia BROCKS, MD;  Location: [facility] ORS;  Service: Urology;  Laterality: Right;   CYSTOSCOPY/URETEROSCOPY/HOLMIUM LASER/STENT PLACEMENT Right 12/16/2020   Procedure: CYSTOSCOPY/URETEROSCOPY/HOLMIUM LASER/STENT PLACEMENT;  Surgeon: Twylla Glendia BROCKS, MD;  Location: [facility] OR[facility];  Service: Urology;  Laterality: Right;   EXTRACORPOREAL SHOCK WAVE LITHOTRIPSY Right 12/11/2020   Procedure: EXTRACORPOREAL SHOCK  WAVE LITHOTRIPSY (ESWL);  Surgeon: Twylla Glendia BROCKS, MD;  Location: [facility] OR[facility];  Service: Urology;  Laterality: Right;   IR BONE TUMOR(S)RF ABLATION  04/16/2022   IR KYPHO EA ADDL LEVEL THORACIC OR LUMBAR  02/02/2021   IR KYPHO LUMBAR INC FX REDUCE BONE BX UNI/BIL CANNULATION INC/IMAGING  02/02/2021   IR KYPHO THORACIC WITH BONE BIOPSY  04/15/2022   KIDNEY STONE SURGERY     KYPHOPLASTY N/A 03/12/2021   Procedure: CLARESSA ONA, L3;  Surgeon: Kathlynn Sharper, MD;  Location: [facility] ORS;  Service: Orthopedics;  Laterality: N/A;   RESECTION SOFT TISSUE TUMOR LEG / ANKLE RADICAL  jan 2009   [facility],  right thigh/knee , nonmalignant   SMALL INTESTINE SURGERY  1946   implaed on picket fence, punctured stomach   TONSILLECTOMY      FAMILY HISTORY: Family History  Problem Relation Age of Onset   Hypertension Father    Hyperlipidemia Father    Cancer Sister        thyroid  - dx in late 20's    ADVANCED DIRECTIVES (Y/N):  N  HEALTH MAINTENANCE: Social History   Tobacco Use   Smoking status: Former    Current packs/day: 0.00    Types: Cigarettes    Quit date: 07/05/1965    Years since quitting: 58.8   Smokeless tobacco: Current   Tobacco comments:    Occasional cigar  Vaping Use   Vaping status: Never Used  Substance Use Topics   Alcohol use: Not Currently    Alcohol/week: 1.0 standard drink of alcohol    Types: 1 Standard drinks or equivalent per week    Comment: occassionaly   Drug use: No     Colonoscopy:  PAP:  Bone density:  Lipid panel:  Allergies  Allergen Reactions   Quinolones     Aortic dilation contraindicates FQ   Amlodipine  Swelling and Other (See Comments)    LE edema   Azithromycin  Nausea And Vomiting   Lipitor [Atorvastatin ]     Muscle pain in RT Leg    Lisinopril  Cough   Zetia  [Ezetimibe ]     Pain in legs    Hydromorphone  Hcl     Wife states Medication makes extremely sedated and lethargic    Current Outpatient Medications  Medication Sig Dispense Refill    Alpha-Lipoic Acid 600 MG CAPS Take by mouth.     Ascorbic Acid  (VITAMIN C) 1000 MG tablet Take 1,000 mg by mouth in the morning and at bedtime.     Calcium  Carb-Cholecalciferol  (OYSTER SHELL CALCIUM  W/D) 500-5 MG-MCG TABS Take 1 tablet by mouth in the morning and at bedtime.     carvedilol  (COREG ) 12.5 MG tablet Take 0.5 tablets (6.25 mg total) by mouth 2 (two) times daily. 180 tablet 3   cyanocobalamin  (VITAMIN B12) 1000 MCG tablet Take 1,000 mcg by mouth daily.     dexamethasone  (DECADRON ) 4 MG tablet TAKE 5 TABLETS BY MOUTH ONCE A WEEK. 60 tablet 2   ELIQUIS  5 MG TABS tablet TAKE 1 TABLET BY MOUTH TWICE A DAY 180 tablet 0   gabapentin  (NEURONTIN ) 300 MG capsule TAKE 2 CAPSULES BY MOUTH 3 TIMES DAILY. 180 capsule 2   ibuprofen  (ADVIL ) 200 MG tablet Take 200  mg by mouth 2 (two) times daily as needed.     lenalidomide  (REVLIMID ) 15 MG capsule Take 1 capsule (15 mg total) by mouth daily. Take for 21 days, then hold for 7 days. Repeat every 28 days. 21 capsule 0   lidocaine  (XYLOCAINE ) 5 % ointment Apply topically 3 (three) times daily as needed for mild pain (pain score 1-3) or moderate pain (pain score 4-6). 50 g 2   morphine  (MS CONTIN ) 15 MG 12 hr tablet Take 2 tablets (30 mg total) by mouth every 8 (eight) hours. 180 tablet 0   morphine  (MS CONTIN ) 30 MG 12 hr tablet Take 1 tablet (30 mg total) by mouth every 8 (eight) hours. 90 tablet 0   Multiple Vitamin (MULTIVITAMIN WITH MINERALS) TABS tablet Take 1 tablet by mouth daily.     Multiple Vitamins-Minerals (PRESERVISION AREDS PO) Take 1 capsule by mouth in the morning and at bedtime.     naloxone  (NARCAN ) nasal spray 4 mg/0.1 mL SPRAY 1 SPRAY INTO ONE NOSTRIL AS DIRECTED FOR OPIOID OVERDOSE (TURN PERSON ON SIDE AFTER DOSE. IF NO RESPONSE IN 2-3 MINUTES OR PERSON RESPONDS BUT RELAPSES, REPEAT USING A NEW SPRAY DEVICE AND SPRAY INTO THE OTHER NOSTRIL. CALL 911 AFTER USE.) * EMERGENCY USE ONLY * 1 each 0   omeprazole  (PRILOSEC) 20 MG capsule TAKE  1 CAPSULE BY MOUTH EVERY DAY 90 capsule 2   Oxycodone  HCl 10 MG TABS Take 1 tablet (10 mg total) by mouth every 4 (four) hours as needed (pain). 90 tablet 0   polyethylene glycol (MIRALAX  / GLYCOLAX ) 17 g packet Take 17 g by mouth 2 (two) times daily.  0   potassium citrate  (UROCIT-K ) 10 MEQ (1080 MG) SR tablet TAKE 1 TABLET (10 MEQ TOTAL) BY MOUTH 3 (THREE) TIMES DAILY WITH MEALS. 300 tablet 2   senna-docusate (SENOKOT-S) 8.6-50 MG tablet Take 1 tablet by mouth 2 (two) times daily. 30 tablet 0   No current facility-administered medications for this visit.    OBJECTIVE: Vitals:   04/17/24 1034 04/17/24 1037  BP: (!) 154/80 (!) 142/90  Pulse: (!) 49   Resp: 16   Temp: (!) 97.4 F (36.3 C)   SpO2: 96%        Body mass index is 25.54 kg/m.    ECOG FS:1 - Symptomatic but completely ambulatory  General: Well-developed, well-nourished, no acute distress. Eyes: Pink conjunctiva, anicteric sclera. HEENT: Normocephalic, moist mucous membranes. Lungs: No audible wheezing or coughing. Heart: Regular rate and rhythm. Abdomen: Soft, nontender, no obvious distention. Musculoskeletal: No edema, cyanosis, or clubbing. Neuro: Alert, answering all questions appropriately. Cranial nerves grossly intact. Skin: No rashes or petechiae noted. Psych: Normal affect.  LAB RESULTS:  Lab Results  Component Value Date   NA 138 04/17/2024   K 4.2 04/17/2024   CL 104 04/17/2024   CO2 27 04/17/2024   GLUCOSE 133 (H) 04/17/2024   BUN 29 (H) 04/17/2024   CREATININE 1.43 (H) 04/17/2024   CALCIUM  9.0 04/17/2024   PROT 5.7 (L) 04/17/2024   ALBUMIN 3.6 04/17/2024   AST 17 04/17/2024   ALT 9 04/17/2024   ALKPHOS 95 04/17/2024   BILITOT 0.6 04/17/2024   GFRNONAA 47 (L) 04/17/2024   GFRAA 29 (L) 10/16/2017    Lab Results  Component Value Date   WBC 3.8 (L) 04/17/2024   NEUTROABS 2.7 04/17/2024   HGB 12.7 (L) 04/17/2024   HCT 39.2 04/17/2024   MCV 102.6 (H) 04/17/2024   PLT 21 (L) 04/17/2024  STUDIES: No results found.   ASSESSMENT: Stage II Kappa chain myeloma.  PLAN:    Stage II kappa chain myeloma: (11:14 translocation, high risk) SPEP essentially negative and immunoglobulins are within normal limits.  Patient's kappa light chains initially improved from greater than 5400 down to 39.1.  PET scan results from December 08, 2021 reviewed independently with no obvious evidence of progressive disease.  Nuclear med bone scan results from December 28, 2022 did not reveal any concerning pathology.  Patient was off Revlimid  for approximately 4 months given admission for sepsis and abscess and extended antibiotic treatment.  His kappa chains have increased to 190.8, but today's result is 187.6.  Xgeva  has been discontinued permanently secondary to dental issues.  Given patient's persistent thrombocytopenia, will continue to hold Revlimid  and proceed with repeat bone marrow biopsy.  Patient's previous dosing was 15 mg daily for 21 days with 7 days off.  Return to clinic 1 week after her biopsy to discuss the results and treatment planning. Anemia: Mild, monitor. Leukopenia: Total white blood cell count has improved to 3.8 off Revlimid .  Bone marrow biopsy as above. Thrombocytopenia: Platelets continue to trend down and are now 21 despite holding Revlimid .  Bone marrow biopsy as above.   Bone lesions: Recent nuclear med bone scan as above with no obvious evidence of progressive disease.  Xgeva  has been discontinued. Pain: Chronic and unchanged.  Continue injections as per pain clinic.  Nuclear medicine bone scan completed on December 28, 2022 did not report any active areas of myeloma.  Patient only takes long-acting morphine  30 mg every 8 hours.  He also takes Tylenol  and ibuprofen  sparingly.  Previously he was instructed that if his platelets fall below 50 this would need to be discontinued.  Radiation oncology has determined no further treatments are needed at this time.   Renal insufficiency:  Creatinine is slowly trending up and is now 1.43.  Monitor.  Patient expressed understanding and was in agreement with this plan. He also understands that He can call clinic at any time with any questions, concerns, or complaints.     Cancer Staging  Kappa light chain myeloma (HCC) Staging form: Plasma Cell Myeloma and Plasma Cell Disorders, AJCC 8th Edition - Clinical stage from 04/16/2021: Albumin (g/dL): 3.8, ISS: Stage II, High-risk cytogenetics: Absent, LDH: Unknown - Signed by Jacobo Evalene PARAS, MD on 04/16/2021 Albumin range (g/dL): Greater than or equal to 3.5 Cytogenetics: t(11;14) translocation Serum calcium  level: Normal Serum creatinine level: Normal Bone disease on imaging: Present  Evalene PARAS Jacobo, MD   04/19/2024 11:15 PM

## 2024-04-23 ENCOUNTER — Other Ambulatory Visit: Payer: Self-pay | Admitting: *Deleted

## 2024-04-23 ENCOUNTER — Encounter: Payer: Self-pay | Admitting: Oncology

## 2024-04-23 ENCOUNTER — Other Ambulatory Visit: Payer: Self-pay | Admitting: Thoracic Surgery (Cardiothoracic Vascular Surgery)

## 2024-04-23 DIAGNOSIS — I7121 Aneurysm of the ascending aorta, without rupture: Secondary | ICD-10-CM

## 2024-04-23 MED ORDER — MORPHINE SULFATE ER 15 MG PO TBCR: 30.0000 mg | EXTENDED_RELEASE_TABLET | Freq: Three times a day (TID) | ORAL | 0 refills | Status: DC

## 2024-04-23 MED ORDER — OXYCODONE HCL 10 MG PO TABS: 10.0000 mg | ORAL_TABLET | ORAL | 0 refills | Status: DC | PRN

## 2024-04-24 ENCOUNTER — Other Ambulatory Visit: Payer: Self-pay

## 2024-04-24 ENCOUNTER — Other Ambulatory Visit: Payer: Self-pay | Admitting: Thoracic Surgery (Cardiothoracic Vascular Surgery)

## 2024-04-24 ENCOUNTER — Encounter: Payer: Self-pay | Admitting: Oncology

## 2024-04-24 DIAGNOSIS — I7121 Aneurysm of the ascending aorta, without rupture: Secondary | ICD-10-CM

## 2024-04-24 MED ORDER — MORPHINE SULFATE ER 15 MG PO TBCR
30.0000 mg | EXTENDED_RELEASE_TABLET | Freq: Three times a day (TID) | ORAL | 0 refills | Status: DC
  Filled 2024-04-24: qty 180, 30d supply, fill #0

## 2024-04-24 NOTE — Telephone Encounter (Signed)
 New rx pended or MD to submit to [facility]. Rx sent to [facility] cancelled.

## 2024-04-25 ENCOUNTER — Other Ambulatory Visit: Payer: Self-pay

## 2024-04-25 DIAGNOSIS — Z01818 Encounter for other preprocedural examination: Secondary | ICD-10-CM

## 2024-04-25 NOTE — Progress Notes (Signed)
 Patient for IR Bone Marrow Biopsy on Thurs 04/26/24, I called and spoke with the patient on the phone and gave pre-procedure instructions. Pt was made aware to be here at 7:30a, NPO after MN prior to procedure as well as driver post procedure/recovery/discharge. Pt stated understanding.  Called 04/25/24

## 2024-04-26 ENCOUNTER — Ambulatory Visit
Admission: RE | Admit: 2024-04-26 | Discharge: 2024-04-26 | Disposition: A | Discharge status: Home / Self Care | Source: Ambulatory Visit | Attending: Oncology | Admitting: Oncology

## 2024-04-26 ENCOUNTER — Other Ambulatory Visit: Payer: Self-pay

## 2024-04-26 DIAGNOSIS — Z01818 Encounter for other preprocedural examination: Secondary | ICD-10-CM

## 2024-04-26 DIAGNOSIS — G893 Neoplasm related pain (acute) (chronic): Secondary | ICD-10-CM | POA: Insufficient documentation

## 2024-04-26 DIAGNOSIS — Z7901 Long term (current) use of anticoagulants: Secondary | ICD-10-CM | POA: Diagnosis not present

## 2024-04-26 DIAGNOSIS — D696 Thrombocytopenia, unspecified: Secondary | ICD-10-CM | POA: Diagnosis not present

## 2024-04-26 DIAGNOSIS — D7589 Other specified diseases of blood and blood-forming organs: Secondary | ICD-10-CM | POA: Diagnosis not present

## 2024-04-26 DIAGNOSIS — Z79899 Other long term (current) drug therapy: Secondary | ICD-10-CM | POA: Insufficient documentation

## 2024-04-26 DIAGNOSIS — Z1379 Encounter for other screening for genetic and chromosomal anomalies: Secondary | ICD-10-CM | POA: Diagnosis not present

## 2024-04-26 DIAGNOSIS — C9000 Multiple myeloma not having achieved remission: Secondary | ICD-10-CM | POA: Insufficient documentation

## 2024-04-26 DIAGNOSIS — D539 Nutritional anemia, unspecified: Secondary | ICD-10-CM | POA: Diagnosis not present

## 2024-04-26 HISTORY — PX: IR BONE MARROW BIOPSY & ASPIRATION: IMG5727

## 2024-04-26 LAB — CBC WITH DIFFERENTIAL/PLATELET
Abs Granulocyte: 2.7 K/uL (ref 1.5–6.5)
Abs Immature Granulocytes: 0.02 K/uL (ref 0.00–0.07)
Basophils Absolute: 0 K/uL (ref 0.0–0.1)
Basophils Relative: 0 %
Eosinophils Absolute: 0.1 K/uL (ref 0.0–0.5)
Eosinophils Relative: 1 %
HCT: 39.1 % (ref 39.0–52.0)
Hemoglobin: 12.6 g/dL — ABNORMAL LOW (ref 13.0–17.0)
Immature Granulocytes: 0 %
Lymphocytes Relative: 27 %
Lymphs Abs: 1.2 K/uL (ref 0.7–4.0)
MCH: 33.4 pg (ref 26.0–34.0)
MCHC: 32.2 g/dL (ref 30.0–36.0)
MCV: 103.7 fL — ABNORMAL HIGH (ref 80.0–100.0)
Monocytes Absolute: 0.6 K/uL (ref 0.1–1.0)
Monocytes Relative: 12 %
Neutro Abs: 2.7 K/uL (ref 1.7–7.7)
Neutrophils Relative %: 60 %
Platelets: 26 K/uL — CL (ref 150–400)
RBC: 3.77 MIL/uL — ABNORMAL LOW (ref 4.22–5.81)
RDW: 15.6 % — ABNORMAL HIGH (ref 11.5–15.5)
WBC: 4.6 K/uL (ref 4.0–10.5)
nRBC: 0 % (ref 0.0–0.2)

## 2024-04-26 MED ORDER — HEPARIN SOD (PORK) LOCK FLUSH 100 UNIT/ML IV SOLN
INTRAVENOUS | Status: AC
  Filled 2024-04-26: qty 5

## 2024-04-26 MED ORDER — FENTANYL CITRATE (PF) 100 MCG/2ML IJ SOLN
INTRAMUSCULAR | Status: AC | PRN
  Administered 2024-04-26 (×2): 25 ug via INTRAVENOUS

## 2024-04-26 MED ORDER — MIDAZOLAM HCL 5 MG/5ML IJ SOLN
INTRAMUSCULAR | Status: AC | PRN
  Administered 2024-04-26: .5 mg via INTRAVENOUS

## 2024-04-26 MED ORDER — SODIUM CHLORIDE 0.9 % IV SOLN: INTRAVENOUS | Status: DC

## 2024-04-26 MED ORDER — MIDAZOLAM HCL 2 MG/2ML IJ SOLN
INTRAMUSCULAR | Status: AC
  Filled 2024-04-26: qty 2

## 2024-04-26 MED ORDER — FENTANYL CITRATE (PF) 100 MCG/2ML IJ SOLN
INTRAMUSCULAR | Status: AC
  Filled 2024-04-26: qty 2

## 2024-04-26 NOTE — Progress Notes (Signed)
 Patient clinically stable post IR BMB per DR Mugweru, tolerated well. Vitals stable pre and post procedure. Received Versed  0.5 mg along with Fentanyl   50 mcg IV for procedure. Report given to Joni Boyd RN post procedure/specials/16

## 2024-04-26 NOTE — Procedures (Signed)
 Vascular and Interventional Radiology Procedure Note  Patient: Jack [name] DOB: 1932-11-04 Medical Record Number: [id number] Note Date/Time: 04/26/24 9:14 AM   Performing Physician: Thom Hall, MD Assistant(s): None  Diagnosis: Hx MM. Eval for progression   Procedure: BONE MARROW ASPIRATION and BIOPSY  Anesthesia: Conscious Sedation Complications: None Estimated Blood Loss: Minimal Specimens: Sent for Pathology  Findings:  Successful Fluoroscopy-guided bone marrow aspiration and biopsy A total of 1 cores were obtained. Hemostasis of the tract was achieved using Manual Pressure.  Plan: Bed rest for 1 hours.  See detailed procedure note with images in PACS. The patient tolerated the procedure well without incident or complication and was returned to Recovery in stable condition.    Thom Hall, MD [facility] Inter[facility] Radiology   Pager. [phone] Clinic. [phone]

## 2024-04-26 NOTE — H&P (Signed)
 Chief Complaint: Patient was seen in consultation today for myeloma   Procedure: Bone Marrow Biopsy with Aspiration   Referring Physician(s): Finnegan,Timothy J  Supervising Physician: Hughes Simmonds  Patient Status: [facility] - Out-pt  History of Present Illness: Jack [name] is a 89 y.o. male with a history of Stage II kappa chain myeloma for the past several years. Per notes, he has had a previous bone marrow biopsy, likely at an outside facility. Patient was off Revlimid  for about 4 months following a hospital admission for sepsis and need for extended antibiotic treatment. Since then his kappa chains have increased to 190.8 followed by a slight drop to 187.6 on 8/5. Also noted to have worsening thrombocytopenia for the past several months, despite holding Revlimid . IR with request for repeat bone marrow biopsy to evaluate for disease progression.   Patient resting in bed in no acute distress with wife at the bedside. Admits to persistent fatigue and weakness as well as intermittent pain from the myeloma; however, he reports this has actually improved within the past week. Takes Oxy and morphine  at home as needed for pain which he reports does keep it fairly well controlled. He denies any fevers/chills, nausea/vomiting, abdominal pain, chest pain, shortness of breath, or changes in bowel/bladder habits. All questions and concerns answered at the bedside.   Code Status: Full Code  Past Medical History:  Diagnosis Date   Ascending aortic aneurysm (HCC)    a. 12/2020 Echo: Asc Ao 48mm, Ao root 40mm. b. 03/2022 Asc Ao 4.6 cm (4.5 cm in 2019)   CKD (chronic kidney disease), stage III (HCC)    Compression fracture of body of thoracic vertebra (HCC)    Diastolic dysfunction    a. 12/2020 Echo: EF 50-55%, no rwma, mild LVH, GrI DD, nl RV fxn, mild AI. Asc Ao 48mm, Ao root 40mm.   Elevated prostate specific antigen (PSA)    has been 7 for a year    GERD (gastroesophageal reflux disease)     History of colon polyps 2008   [facility],    History of kidney stones    Hyperlipidemia    Hypertension    Myeloma (HCC)    PAF (paroxysmal atrial fibrillation) (HCC)    a. 01/2021-->Eliquis  (CHA2DS2VASc = 3-4).   Pain    Prostate hypertrophy    diagnosed at age 26 due to hematospermia    Past Surgical History:  Procedure Laterality Date   CATARACT EXTRACTION W/PHACO Left 01/10/2018   Procedure: CATARACT EXTRACTION PHACO AND INTRAOCULAR LENS PLACEMENT (IOC);  Surgeon: Jaye Fallow, MD;  Location: [facility] ORS;  Service: Ophthalmology;  Laterality: Left;  US  00:24.8 AP% 14.9 CDE 3.68 Fluid pack lot # [id number]   CATARACT EXTRACTION W/PHACO Right 01/25/2018   Procedure: CATARACT EXTRACTION PHACO AND INTRAOCULAR LENS PLACEMENT (IOC);  Surgeon: Jaye Fallow, MD;  Location: [facility] ORS;  Service: Ophthalmology;  Laterality: Right;  US  00:42 AP% 10.8 CDE 4.59 Fluid pack lot # [id number]   COLON SURGERY     CYSTOSCOPY W/ URETERAL STENT PLACEMENT Right 10/16/2017   Procedure: right  URETERAL STENT PLACEMENT,cystoscopy bilateral stent removal,rretrograde;  Surgeon: Twylla Glendia BROCKS, MD;  Location: [facility] ORS;  Service: Urology;  Laterality: Right;   CYSTOSCOPY/URETEROSCOPY/HOLMIUM LASER/STENT PLACEMENT Right 12/16/2020   Procedure: CYSTOSCOPY/URETEROSCOPY/HOLMIUM LASER/STENT PLACEMENT;  Surgeon: Twylla Glendia BROCKS, MD;  Location: [facility] ORS;  Service: Urology;  Laterality: Right;   EXTRACORPOREAL SHOCK WAVE LITHOTRIPSY Right 12/11/2020   Procedure: EXTRACORPOREAL SHOCK WAVE LITHOTRIPSY (ESWL);  Surgeon: Twylla Glendia BROCKS,  MD;  Location: [facility] ORS;  Service: Urology;  Laterality: Right;   IR BONE TUMOR(S)RF ABLATION  04/16/2022   IR KYPHO EA ADDL LEVEL THORACIC OR LUMBAR  02/02/2021   IR KYPHO LUMBAR INC FX REDUCE BONE BX UNI/BIL CANNULATION INC/IMAGING  02/02/2021   IR KYPHO THORACIC WITH BONE BIOPSY  04/15/2022   KIDNEY STONE SURGERY     KYPHOPLASTY N/A 03/12/2021   Procedure: CLARESSA ONA, L3;   Surgeon: Kathlynn Sharper, MD;  Location: [facility] ORS;  Service: Orthopedics;  Laterality: N/A;   RESECTION SOFT TISSUE TUMOR LEG / ANKLE RADICAL  jan 2009   [facility],  right thigh/knee , nonmalignant   SMALL INTESTINE SURGERY  1946   implaed on picket fence, punctured stomach   TONSILLECTOMY      Allergies: Quinolones, Amlodipine , Azithromycin , Lipitor [atorvastatin ], Lisinopril , Zetia  [ezetimibe ], and Hydromorphone  hcl  Medications: Prior to Admission medications   Medication Sig Start Date End Date Taking? Authorizing Provider  Alpha-Lipoic Acid 600 MG CAPS Take by mouth.   Yes [provider]  Ascorbic Acid  (VITAMIN C) 1000 MG tablet Take 1,000 mg by mouth in the morning and at bedtime.   Yes [provider]  Calcium  Carb-Cholecalciferol  (OYSTER SHELL CALCIUM  W/D) 500-5 MG-MCG TABS Take 1 tablet by mouth in the morning and at bedtime.   Yes [provider]  carvedilol  (COREG ) 12.5 MG tablet Take 0.5 tablets (6.25 mg total) by mouth 2 (two) times daily. 03/04/23  Yes Vivienne Lonni Ingle, NP  cyanocobalamin  (VITAMIN B12) 1000 MCG tablet Take 1,000 mcg by mouth daily.   Yes [provider]  dexamethasone  (DECADRON ) 4 MG tablet TAKE 5 TABLETS BY MOUTH ONCE A WEEK. 01/30/24  Yes Jacobo Evalene PARAS, MD  ELIQUIS  5 MG TABS tablet TAKE 1 TABLET BY MOUTH TWICE A DAY 10/24/23  Yes Finnegan, Timothy J, MD  gabapentin  (NEURONTIN ) 300 MG capsule TAKE 2 CAPSULES BY MOUTH 3 TIMES DAILY. 03/09/24  Yes Jacobo Evalene PARAS, MD  ibuprofen  (ADVIL ) 200 MG tablet Take 200 mg by mouth 2 (two) times daily as needed.   Yes [provider]  lenalidomide  (REVLIMID ) 15 MG capsule Take 1 capsule (15 mg total) by mouth daily. Take for 21 days, then hold for 7 days. Repeat every 28 days. 04/04/24  Yes Jacobo Evalene PARAS, MD  lidocaine  (XYLOCAINE ) 5 % ointment Apply topically 3 (three) times daily as needed for mild pain (pain score 1-3) or moderate pain (pain score 4-6). 04/13/24  Yes  Jacobo Evalene PARAS, MD  morphine  (MS CONTIN ) 15 MG 12 hr tablet Take 2 tablets (30 mg total) by mouth every 8 (eight) hours. 04/24/24  Yes Jacobo Evalene PARAS, MD  Multiple Vitamin (MULTIVITAMIN WITH MINERALS) TABS tablet Take 1 tablet by mouth daily. 04/08/21  Yes Awanda City, MD  Multiple Vitamins-Minerals (PRESERVISION AREDS PO) Take 1 capsule by mouth in the morning and at bedtime.   Yes [provider]  omeprazole  (PRILOSEC) 20 MG capsule TAKE 1 CAPSULE BY MOUTH EVERY DAY 08/16/23  Yes End, Lonni, MD  Oxycodone  HCl 10 MG TABS Take 1 tablet (10 mg total) by mouth every 4 (four) hours as needed (pain). 04/23/24  Yes Jacobo Evalene PARAS, MD  polyethylene glycol (MIRALAX  / GLYCOLAX ) 17 g packet Take 17 g by mouth 2 (two) times daily. 04/07/21  Yes Awanda City, MD  potassium citrate  (UROCIT-K ) 10 MEQ (1080 MG) SR tablet TAKE 1 TABLET (10 MEQ TOTAL) BY MOUTH 3 (THREE) TIMES DAILY WITH MEALS. 11/25/23  Yes Stoioff, Glendia  C, MD  senna-docusate (SENOKOT-S) 8.6-50 MG tablet Take 1 tablet by mouth 2 (two) times daily. 03/17/21  Yes Fausto Sor A, DO  naloxone  (NARCAN ) nasal spray 4 mg/0.1 mL SPRAY 1 SPRAY INTO ONE NOSTRIL AS DIRECTED FOR OPIOID OVERDOSE (TURN PERSON ON SIDE AFTER DOSE. IF NO RESPONSE IN 2-3 MINUTES OR PERSON RESPONDS BUT RELAPSES, REPEAT USING A NEW SPRAY DEVICE AND SPRAY INTO THE OTHER NOSTRIL. CALL 911 AFTER USE.) * EMERGENCY USE ONLY * 03/25/21   Borders, Fonda SAUNDERS, NP     Family History  Problem Relation Age of Onset   Hypertension Father    Hyperlipidemia Father    Cancer Sister        thyroid  - dx in late 20's    Social History   Socioeconomic History   Marital status: Married    Spouse name: Paramedic   Number of children: 4   Years of education: 16   Highest education level: Not on file  Occupational History   Occupation: Retired    Associate Professor: retired    Comment: Real Estate   Tobacco Use   Smoking status: Former    Current packs/day: 0.00    Types:  Cigarettes    Quit date: 07/05/1965    Years since quitting: 58.8   Smokeless tobacco: Current   Tobacco comments:    Occasional cigar  Vaping Use   Vaping status: Never Used  Substance and Sexual Activity   Alcohol use: Not Currently    Alcohol/week: 1.0 standard drink of alcohol    Types: 1 Standard drinks or equivalent per week    Comment: occassionaly   Drug use: No   Sexual activity: Yes    Birth control/protection: None  Other Topics Concern   Not on file  Social History Narrative   Not on file   Social Drivers of Health   Financial Resource Strain: Low Risk  (01/05/2024)   Received from [facility]   Overall Financial Resource Strain (CARDIA)    Difficulty of Paying Living Expenses: Not hard at all  Food Insecurity: No Food Insecurity (01/05/2024)   Received from [facility]   Hunger Vital Sign    Within the past 12 months, you worried that your food would run out before you got the money to buy more.: Never true    Within the past 12 months, the food you bought just didn't last and you didn't have money to get more.: Never true  Transportation Needs: No Transportation Needs (01/05/2024)   Received from [facility] - Transportation    In the past 12 months, has lack of transportation kept you from medical appointments or from getting medications?: No    Lack of Transportation (Non-Medical): No  Physical Activity: Not on file  Stress: Not on file  Social Connections: Not on file    Review of Systems  Constitutional:  Positive for fatigue.  Neurological:  Positive for weakness.  Denies any N/V, chest pain, shortness of breath, fevers/chills. All other ROS negative.  Vital Signs: BP (!) 175/82   Pulse (!) 46   Temp 98 F (36.7 C) (Oral)   Resp 19   Ht 5' 5 (1.651 m)   Wt 170 lb (77.1 kg)   SpO2 95%   BMI 28.29 kg/m    Physical Exam Constitutional:      Appearance: Normal appearance.  HENT:      Mouth/Throat:     Mouth: Mucous membranes are  moist.     Pharynx: Oropharynx is clear.  Cardiovascular:     Rate and Rhythm: Normal rate and regular rhythm.     Heart sounds: Normal heart sounds.  Pulmonary:     Effort: Pulmonary effort is normal.     Breath sounds: Normal breath sounds.  Abdominal:     General: Abdomen is flat.     Palpations: Abdomen is soft.     Tenderness: There is no abdominal tenderness.  Skin:    General: Skin is warm and dry.  Neurological:     Mental Status: He is alert. Mental status is at baseline.  Psychiatric:        Behavior: Behavior normal.     Imaging: No results found.  Labs:  CBC: Recent Labs    01/30/24 1358 02/21/24 0951 04/02/24 0857 04/17/24 1020  WBC 3.2* 3.6* 2.7* 3.8*  HGB 11.9* 12.2* 12.9* 12.7*  HCT 36.5* 38.7* 40.0 39.2  PLT 59* 50* 37* 21*    COAGS: No results for input(s): INR, APTT in the last 8760 hours.  BMP: Recent Labs    01/30/24 1358 02/21/24 0951 04/02/24 0857 04/17/24 1020  NA 135 136 138 138  K 4.3 4.6 3.7 4.2  CL 101 102 105 104  CO2 26 28 24 27   GLUCOSE 115* 172* 161* 133*  BUN 29* 27* 30* 29*  CALCIUM  8.6* 8.7* 8.8* 9.0  CREATININE 1.27* 1.24 1.36* 1.43*  GFRNONAA 54* 56* 50* 47*    LIVER FUNCTION TESTS: Recent Labs    01/30/24 1358 02/21/24 0951 04/02/24 0857 04/17/24 1020  BILITOT 0.7 0.8 1.0 0.6  AST 17 19 16 17   ALT 10 10 12 9   ALKPHOS 97 95 89 95  PROT 5.9* 5.9* 5.7* 5.7*  ALBUMIN 3.5 3.5 3.8 3.6    TUMOR MARKERS: No results for input(s): AFPTM, CEA, CA199, CHROMGRNA in the last 8760 hours.  Assessment and Plan:  Kappa Chain Myelmoa: Jack [name] is a 89 y.o. male with a history of kappa chain myeloma previously undergoing treatments with Dr. Jacobo. Unfortunately, patient had to stop his treatment secondary to extended antibiotic treatment for sepsis and abscess. He's now noted to have increased kappa chains with progressively worsening  thrombocytopenia, despite stopping Revlimid . He presents today for repeat bone marrow biopsy to assess for disease progression. Procedure to be performed under moderate sedation.  -NPO since midnight -On Eliquis  -Plan for bone marrow biopsy with Dr. JINNY Hall  Risks and benefits of bone marrow biopys was discussed with the patient and/or patient's family including, but not limited to bleeding, infection, damage to adjacent structures or low yield requiring additional tests.  All of the questions were answered and there is agreement to proceed.  Consent signed and in chart.   Thank you for this interesting consult. I greatly enjoyed meeting Jack [name] and look forward to participating in their care. A copy of this report was sent to the requesting provider on this date.  Electronically Signed: Gladie Gravette M Dashonna Chagnon, PA-C 04/26/2024, 8:20 AM   I spent a total of 30 Minutes  in face to face clinical consultation, greater than 50% of which was counseling/coordinating care for bone marrow biopsy with aspiration.

## 2024-04-30 ENCOUNTER — Other Ambulatory Visit: Payer: Self-pay | Admitting: *Deleted

## 2024-04-30 ENCOUNTER — Encounter: Payer: Self-pay | Admitting: Oncology

## 2024-04-30 ENCOUNTER — Telehealth: Payer: Self-pay | Admitting: *Deleted

## 2024-04-30 DIAGNOSIS — C9000 Multiple myeloma not having achieved remission: Secondary | ICD-10-CM

## 2024-04-30 MED ORDER — LENALIDOMIDE 15 MG PO CAPS: 15.0000 mg | ORAL_CAPSULE | Freq: Every day | ORAL | 0 refills | Status: AC

## 2024-04-30 MED ORDER — LIDOCAINE 5 % EX OINT: TOPICAL_OINTMENT | Freq: Three times a day (TID) | CUTANEOUS | 2 refills | Status: DC | PRN

## 2024-04-30 NOTE — Telephone Encounter (Signed)
 Currently with [facility] wanting to get a refill for Jack [name] 15 mg for 21 days and 7 days off

## 2024-05-01 LAB — SURGICAL PATHOLOGY

## 2024-05-07 ENCOUNTER — Encounter: Payer: Self-pay | Admitting: Oncology

## 2024-05-07 ENCOUNTER — Encounter (HOSPITAL_COMMUNITY): Payer: Self-pay | Admitting: Oncology

## 2024-05-07 ENCOUNTER — Inpatient Hospital Stay (HOSPITAL_BASED_OUTPATIENT_CLINIC_OR_DEPARTMENT_OTHER): Admitting: Oncology

## 2024-05-07 VITALS — BP 116/67 | HR 55 | Temp 97.3°F | Resp 18 | Ht 65.0 in | Wt 173.0 lb

## 2024-05-07 DIAGNOSIS — C9000 Multiple myeloma not having achieved remission: Secondary | ICD-10-CM

## 2024-05-07 DIAGNOSIS — H353221 Exudative age-related macular degeneration, left eye, with active choroidal neovascularization: Secondary | ICD-10-CM | POA: Diagnosis not present

## 2024-05-07 NOTE — Progress Notes (Signed)
 [facility]  [phone] [phone]  ID: Jack [name] OB: October 29, 1932  MR#: [id number]  RDW#:[id number]  Patient Care Team: Glover Lenis, MD as PCP - General (Family Medicine) End, Lonni, MD as PCP - Cardiology (Cardiology) Jana Lavenia SQUIBB, MD (Endocrinology) Jacobo Evalene PARAS, MD as Consulting Physician (Oncology)   CHIEF COMPLAINT: Stage II kappa chain myeloma, in remission.  INTERVAL HISTORY: Patient returns to clinic today for further evaluation and discussion of his bone marrow biopsy results.  He currently feels well.  His fatigue is improving since he has discontinued Revlimid .  He continues to have chronic back pain. He has no neurologic complaints.  He denies any recent fevers or illnesses.  He has a good appetite and denies weight loss.  He denies chest pain, shortness of breath, cough, or hemoptysis.  He denies any nausea, vomiting, constipation, or diarrhea.  He has no urinary complaints.  Patient offers no further specific complaints today.  REVIEW OF SYSTEMS:   Review of Systems  Constitutional:  Positive for malaise/fatigue. Negative for fever.  Respiratory: Negative.  Negative for cough, hemoptysis and shortness of breath.   Cardiovascular: Negative.  Negative for chest pain and leg swelling.  Gastrointestinal: Negative.  Negative for abdominal pain.  Genitourinary: Negative.  Negative for dysuria and flank pain.  Musculoskeletal:  Positive for back pain. Negative for falls and joint pain.  Skin: Negative.  Negative for rash.  Neurological: Negative.  Negative for dizziness, focal weakness, weakness and headaches.  Psychiatric/Behavioral: Negative.  The patient is not nervous/anxious.     As per HPI. Otherwise, a complete review of systems is negative.  PAST MEDICAL HISTORY: Past Medical History:  Diagnosis Date   Ascending aortic aneurysm (HCC)    a. 12/2020 Echo: Asc Ao 48mm, Ao root 40mm. b. 03/2022 Asc Ao  4.6 cm (4.5 cm in 2019)   CKD (chronic kidney disease), stage III (HCC)    Compression fracture of body of thoracic vertebra (HCC)    Diastolic dysfunction    a. 12/2020 Echo: EF 50-55%, no rwma, mild LVH, GrI DD, nl RV fxn, mild AI. Asc Ao 48mm, Ao root 40mm.   Elevated prostate specific antigen (PSA)    has been 7 for a year    GERD (gastroesophageal reflux disease)    History of colon polyps 2008   [facility],    History of kidney stones    Hyperlipidemia    Hypertension    Myeloma (HCC)    PAF (paroxysmal atrial fibrillation) (HCC)    a. 01/2021-->Eliquis  (CHA2DS2VASc = 3-4).   Pain    Prostate hypertrophy    diagnosed at age 71 due to hematospermia    PAST SURGICAL HISTORY: Past Surgical History:  Procedure Laterality Date   CATARACT EXTRACTION W/PHACO Left 01/10/2018   Procedure: CATARACT EXTRACTION PHACO AND INTRAOCULAR LENS PLACEMENT (IOC);  Surgeon: Jaye Fallow, MD;  Location: [facility] ORS;  Service: Ophthalmology;  Laterality: Left;  US  00:24.8 AP% 14.9 CDE 3.68 Fluid pack lot # [id number]   CATARACT EXTRACTION W/PHACO Right 01/25/2018   Procedure: CATARACT EXTRACTION PHACO AND INTRAOCULAR LENS PLACEMENT (IOC);  Surgeon: Jaye Fallow, MD;  Location: [facility] OR[facility];  Service: Ophthalmology;  Laterality: Right;  US  00:42 AP% 10.8 CDE 4.59 Fluid pack lot # [id number]   COLON SURGERY     CYSTOSCOPY W/ URETERAL STENT PLACEMENT Right 10/16/2017   Procedure: right  URETERAL STENT PLACEMENT,cystoscopy bilateral stent removal,rretrograde;  Surgeon: Twylla Glendia BROCKS, MD;  Location: [facility] OR[facility];  Service:  Urology;  Laterality: Right;   CYSTOSCOPY/URETEROSCOPY/HOLMIUM LASER/STENT PLACEMENT Right 12/16/2020   Procedure: CYSTOSCOPY/URETEROSCOPY/HOLMIUM LASER/STENT PLACEMENT;  Surgeon: Twylla Glendia BROCKS, MD;  Location: [facility] OR[facility];  Service: Urology;  Laterality: Right;   EXTRACORPOREAL SHOCK WAVE LITHOTRIPSY Right 12/11/2020   Procedure: EXTRACORPOREAL SHOCK WAVE LITHOTRIPSY (ESWL);  Surgeon:  Twylla Glendia BROCKS, MD;  Location: [facility] OR[facility];  Service: Urology;  Laterality: Right;   IR BONE MARROW BIOPSY & ASPIRATION  04/26/2024   IR BONE TUMOR(S)RF ABLATION  04/16/2022   IR KYPHO EA ADDL LEVEL THORACIC OR LUMBAR  02/02/2021   IR KYPHO LUMBAR INC FX REDUCE BONE BX UNI/BIL CANNULATION INC/IMAGING  02/02/2021   IR KYPHO THORACIC WITH BONE BIOPSY  04/15/2022   KIDNEY STONE SURGERY     KYPHOPLASTY N/A 03/12/2021   Procedure: CLARESSA ONA, L3;  Surgeon: Kathlynn Sharper, MD;  Location: [facility] OR[facility];  Service: Orthopedics;  Laterality: N/A;   RESECTION SOFT TISSUE TUMOR LEG / ANKLE RADICAL  jan 2009   [facility],  right thigh/knee , nonmalignant   SMALL INTESTINE SURGERY  1946   implaed on picket fence, punctured stomach   TONSILLECTOMY      FAMILY HISTORY: Family History  Problem Relation Age of Onset   Hypertension Father    Hyperlipidemia Father    Cancer Sister        thyroid  - dx in late 20's    ADVANCED DIRECTIVES (Y/N):  N  HEALTH MAINTENANCE: Social History   Tobacco Use   Smoking status: Former    Current packs/day: 0.00    Types: Cigarettes    Quit date: 07/05/1965    Years since quitting: 58.8   Smokeless tobacco: Current   Tobacco comments:    Occasional cigar  Vaping Use   Vaping status: Never Used  Substance Use Topics   Alcohol use: Not Currently    Alcohol/week: 1.0 standard drink of alcohol    Types: 1 Standard drinks or equivalent per week    Comment: occassionaly   Drug use: No     Colonoscopy:  PAP:  Bone density:  Lipid panel:  Allergies  Allergen Reactions   Quinolones     Aortic dilation contraindicates FQ   Amlodipine  Swelling and Other (See Comments)    LE edema   Azithromycin  Nausea And Vomiting   Lipitor [Atorvastatin ]     Muscle pain in RT Leg    Lisinopril  Cough   Zetia  [Ezetimibe ]     Pain in legs    Hydromorphone  Hcl     Wife states Medication makes extremely sedated and lethargic    Current Outpatient Medications  Medication Sig  Dispense Refill   Alpha-Lipoic Acid 600 MG CAPS Take by mouth.     Ascorbic Acid  (VITAMIN C) 1000 MG tablet Take 1,000 mg by mouth in the morning and at bedtime.     Calcium  Carb-Cholecalciferol  (OYSTER SHELL CALCIUM  W/D) 500-5 MG-MCG TABS Take 1 tablet by mouth in the morning and at bedtime.     carvedilol  (COREG ) 12.5 MG tablet Take 0.5 tablets (6.25 mg total) by mouth 2 (two) times daily. 180 tablet 3   cyanocobalamin  (VITAMIN B12) 1000 MCG tablet Take 1,000 mcg by mouth daily.     dexamethasone  (DECADRON ) 4 MG tablet TAKE 5 TABLETS BY MOUTH ONCE A WEEK. 60 tablet 2   ELIQUIS  5 MG TABS tablet TAKE 1 TABLET BY MOUTH TWICE A DAY 180 tablet 0   gabapentin  (NEURONTIN ) 300 MG capsule TAKE 2 CAPSULES BY MOUTH 3 TIMES DAILY. 180 capsule 2  ibuprofen  (ADVIL ) 200 MG tablet Take 200 mg by mouth 2 (two) times daily as needed.     lidocaine  (XYLOCAINE ) 5 % ointment Apply topically 3 (three) times daily as needed for mild pain (pain score 1-3) or moderate pain (pain score 4-6). 50 g 2   morphine  (MS CONTIN ) 15 MG 12 hr tablet Take 2 tablets (30 mg total) by mouth every 8 (eight) hours. 180 tablet 0   Multiple Vitamin (MULTIVITAMIN WITH MINERALS) TABS tablet Take 1 tablet by mouth daily.     Multiple Vitamins-Minerals (PRESERVISION AREDS PO) Take 1 capsule by mouth in the morning and at bedtime.     naloxone  (NARCAN ) nasal spray 4 mg/0.1 mL SPRAY 1 SPRAY INTO ONE NOSTRIL AS DIRECTED FOR OPIOID OVERDOSE (TURN PERSON ON SIDE AFTER DOSE. IF NO RESPONSE IN 2-3 MINUTES OR PERSON RESPONDS BUT RELAPSES, REPEAT USING A NEW SPRAY DEVICE AND SPRAY INTO THE OTHER NOSTRIL. CALL 911 AFTER USE.) * EMERGENCY USE ONLY * 1 each 0   omeprazole  (PRILOSEC) 20 MG capsule TAKE 1 CAPSULE BY MOUTH EVERY DAY 90 capsule 2   Oxycodone  HCl 10 MG TABS Take 1 tablet (10 mg total) by mouth every 4 (four) hours as needed (pain). 90 tablet 0   polyethylene glycol (MIRALAX  / GLYCOLAX ) 17 g packet Take 17 g by mouth 2 (two) times daily.  0    potassium citrate  (UROCIT-K ) 10 MEQ (1080 MG) SR tablet TAKE 1 TABLET (10 MEQ TOTAL) BY MOUTH 3 (THREE) TIMES DAILY WITH MEALS. 300 tablet 2   senna-docusate (SENOKOT-S) 8.6-50 MG tablet Take 1 tablet by mouth 2 (two) times daily. 30 tablet 0   lenalidomide  (REVLIMID ) 15 MG capsule Take 1 capsule (15 mg total) by mouth daily. Take for 21 days, then hold for 7 days. Repeat every 28 days. (Patient not taking: Reported on 05/07/2024) 21 capsule 0   No current facility-administered medications for this visit.    OBJECTIVE: Vitals:   05/07/24 0958  BP: 116/67  Pulse: (!) 55  Resp: 18  Temp: (!) 97.3 F (36.3 C)  SpO2: 95%       Body mass index is 28.79 kg/m.    ECOG FS:1 - Symptomatic but completely ambulatory  General: Well-developed, well-nourished, no acute distress. Eyes: Pink conjunctiva, anicteric sclera. HEENT: Normocephalic, moist mucous membranes. Lungs: No audible wheezing or coughing. Heart: Regular rate and rhythm. Abdomen: Soft, nontender, no obvious distention. Musculoskeletal: No edema, cyanosis, or clubbing. Neuro: Alert, answering all questions appropriately. Cranial nerves grossly intact. Skin: No rashes or petechiae noted. Psych: Normal affect.  LAB RESULTS:  Lab Results  Component Value Date   NA 138 04/17/2024   K 4.2 04/17/2024   CL 104 04/17/2024   CO2 27 04/17/2024   GLUCOSE 133 (H) 04/17/2024   BUN 29 (H) 04/17/2024   CREATININE 1.43 (H) 04/17/2024   CALCIUM  9.0 04/17/2024   PROT 5.7 (L) 04/17/2024   ALBUMIN 3.6 04/17/2024   AST 17 04/17/2024   ALT 9 04/17/2024   ALKPHOS 95 04/17/2024   BILITOT 0.6 04/17/2024   GFRNONAA 47 (L) 04/17/2024   GFRAA 29 (L) 10/16/2017    Lab Results  Component Value Date   WBC 4.6 04/26/2024   NEUTROABS 2.7 04/26/2024   HGB 12.6 (L) 04/26/2024   HCT 39.1 04/26/2024   MCV 103.7 (H) 04/26/2024   PLT 26 (LL) 04/26/2024     STUDIES: IR BONE MARROW BIOPSY & ASPIRATION Result Date: 04/26/2024 INDICATION:  Myeloma, assess for progression of disease EXAM:  FLUOROSCOPIC GUIDED BONE MARROW BIOPSY AND ASPIRATION MEDICATIONS: None FLUOROSCOPY: Radiation Exposure Index and estimated peak skin dose (PSD); Reference air kerma (RAK), 5.3 mGy. ANESTHESIA/SEDATION: Moderate (conscious) sedation was employed during this procedure. A total of Versed  0.5 mg and Fentanyl  50 mcg was administered intravenously. Moderate Sedation Time: 11 minutes. The patient's level of consciousness and vital signs were monitored continuously by radiology nursing throughout the procedure under my direct supervision. COMPLICATIONS: None immediate. PROCEDURE: Informed consent was obtained from the patient following an explanation of the procedure, risks, benefits and alternatives. The patient understands, agrees and consents for the procedure. All questions were addressed. A time out was performed prior to the initiation of the procedure. The patient was positioned prone on the fluoroscopy table and the posterior aspect of the RIGHT iliac crest was marked fluoroscopically. The operative site was prepped and draped in the usual sterile fashion. Under sterile conditions and local anesthesia, an 11 gauge coaxial bone biopsy needle was advanced into the posterior aspect of the RIGHT iliac marrow space under intermittent fluoroscopic guidance. Multiple fluoroscopic images were saved procedural documentation purposes. Initially, a bone marrow aspiration was performed. Next, a bone marrow biopsy was obtained with the 11 gauge outer bone marrow device. Samples were prepared with the cytotechnologist and deemed adequate. The needle was removed and superficial hemostasis was obtained with manual compression. A dressing was applied. The patient tolerated the procedure well without immediate post procedural complication. IMPRESSION: Successful fluoroscopic-guided RIGHT iliac bone marrow aspiration and core biopsy. Thom Hall, MD [facility] Electronically Signed   By: Thom Hall M.D.   On: 04/26/2024 09:23     ASSESSMENT: Stage II Kappa chain myeloma, in remission.  PLAN:    Stage II kappa chain myeloma: (11:14 translocation, high risk) SPEP essentially negative and immunoglobulins are within normal limits.  Patient's kappa light chains initially improved from greater than 5400 down to 39.1.  PET scan results from December 08, 2021 reviewed independently with no obvious evidence of progressive disease.  Nuclear med bone scan results from December 28, 2022 did not reveal any concerning pathology.  Bone marrow biopsy from April 26, 2024 did not reveal any evidence of disease.  Cytogenetics was reported as normal.  His most recent kappa free light chain was 187.6.  Patient has been instructed to continue to hold Revlimid .  No further intervention is needed at this time.  Return to clinic in 1 month for further evaluation and discussion on whether or not to reinitiate maintenance Revlimid   Anemia: Nearly resolved.  Patient's hemoglobin is 12.6. Leukopenia: Resolved. Thrombocytopenia: Chronic and unchanged.  Patient platelet count is 26 today.  Bone marrow biopsy results as above.  Continue to hold Revlimid .   Bone lesions: Recent nuclear med bone scan as above with no obvious evidence of progressive disease.  Xgeva  has been discontinued. Pain: Chronic and unchanged.  Continue injections as per pain clinic.  Nuclear medicine bone scan completed on December 28, 2022 did not report any active areas of myeloma.  Patient only takes long-acting morphine  30 mg every 8 hours.  He also takes Tylenol  and ibuprofen  sparingly.  Previously he was instructed that if his platelets fall below 50 this would need to be discontinued.  Radiation oncology has determined no further treatments are needed at this time.   Renal insufficiency: His most recent creatinine was reported at 1.43.  Monitor.  Patient expressed  understanding and was in agreement with this plan. He also understands  that He can call clinic at any time with any questions, concerns, or complaints.     Cancer Staging  Kappa light chain myeloma (HCC) Staging form: Plasma Cell Myeloma and Plasma Cell Disorders, AJCC 8th Edition - Clinical stage from 04/16/2021: Albumin (g/dL): 3.8, ISS: Stage II, High-risk cytogenetics: Absent, LDH: Unknown - Signed by Jacobo Evalene PARAS, MD on 04/16/2021 Albumin range (g/dL): Greater than or equal to 3.5 Cytogenetics: t(11;14) translocation Serum calcium  level: Normal Serum creatinine level: Normal Bone disease on imaging: Present  Evalene PARAS Jacobo, MD   05/07/2024 5:26 PM

## 2024-05-07 NOTE — Progress Notes (Signed)
 Patient is having more bruising due to the eliquis . He had a Bone marrow biopsy on 04/26/2024. He has also having more neuropathy. He has been off of his Revlimid  for about 3 months and is wondering if he needs to be back on it.

## 2024-05-08 ENCOUNTER — Other Ambulatory Visit: Payer: Self-pay | Admitting: Internal Medicine

## 2024-05-16 ENCOUNTER — Encounter (HOSPITAL_COMMUNITY): Payer: Self-pay

## 2024-05-24 ENCOUNTER — Inpatient Hospital Stay: Attending: Oncology | Admitting: Hospice and Palliative Medicine

## 2024-05-24 DIAGNOSIS — M549 Dorsalgia, unspecified: Secondary | ICD-10-CM | POA: Insufficient documentation

## 2024-05-24 DIAGNOSIS — Z515 Encounter for palliative care: Secondary | ICD-10-CM | POA: Diagnosis not present

## 2024-05-24 DIAGNOSIS — Z809 Family history of malignant neoplasm, unspecified: Secondary | ICD-10-CM | POA: Insufficient documentation

## 2024-05-24 DIAGNOSIS — Z79899 Other long term (current) drug therapy: Secondary | ICD-10-CM | POA: Insufficient documentation

## 2024-05-24 DIAGNOSIS — G629 Polyneuropathy, unspecified: Secondary | ICD-10-CM

## 2024-05-24 DIAGNOSIS — D649 Anemia, unspecified: Secondary | ICD-10-CM | POA: Insufficient documentation

## 2024-05-24 DIAGNOSIS — C9000 Multiple myeloma not having achieved remission: Secondary | ICD-10-CM | POA: Diagnosis not present

## 2024-05-24 DIAGNOSIS — D696 Thrombocytopenia, unspecified: Secondary | ICD-10-CM | POA: Insufficient documentation

## 2024-05-24 DIAGNOSIS — N289 Disorder of kidney and ureter, unspecified: Secondary | ICD-10-CM | POA: Insufficient documentation

## 2024-05-24 DIAGNOSIS — C9001 Multiple myeloma in remission: Secondary | ICD-10-CM | POA: Insufficient documentation

## 2024-05-24 DIAGNOSIS — G893 Neoplasm related pain (acute) (chronic): Secondary | ICD-10-CM | POA: Diagnosis not present

## 2024-05-24 DIAGNOSIS — G8929 Other chronic pain: Secondary | ICD-10-CM | POA: Insufficient documentation

## 2024-05-24 DIAGNOSIS — Z87891 Personal history of nicotine dependence: Secondary | ICD-10-CM | POA: Insufficient documentation

## 2024-05-24 DIAGNOSIS — D72819 Decreased white blood cell count, unspecified: Secondary | ICD-10-CM | POA: Insufficient documentation

## 2024-05-24 NOTE — Progress Notes (Signed)
 Virtual Visit via Telephone Note  I connected with Jack [name] on 05/24/24 at  1:30 PM EDT by telephone and verified that I am speaking with the correct person using two identifiers.  Location: Patient: Home Provider: Clinic   I discussed the limitations, risks, security and privacy concerns of performing an evaluation and management service by telephone and the availability of in person appointments. I also discussed with the patient that there may be a patient responsible charge related to this service. The patient expressed understanding and agreed to proceed.   History of Present Illness: Jack [name] is a 89 y.o. male with multiple medical problems including hypertension, CKD stage IIIb, A. fib on Eliquis , anemia, and refractory multiple myeloma.  Patient has history of pathologic compression fractures with T11 kyphoplasty and ablation.  He has had chronic back pain.  Observations/Objective: I spoke with patient and wife by phone.  Patient reports he is doing well since being off Revlimid .  Overall, he feels symptoms are stable.  He says that he was feeling so good that he tried to wean himself from the MS Contin  but found that it led to rebound pain.  He says that he return to 30 mg every 8 hour dosing and continues to take oxycodone  as needed.  Denies any adverse effects from pain medications.  Denies any other new symptomatic complaints or concerns.  Assessment and Plan: Chronic back pain secondary to multiple myeloma and history of recurrent/chronic compression fractures.  S/p XRT.  Continue MS Contin /oxycodone .   Neuropathy -Continue gabapentin .   Follow Up Instructions: Follow-up telephone visit 2 to 3 months   I discussed the assessment and treatment plan with the patient. The patient was provided an opportunity to ask questions and all were answered. The patient agreed with the plan and demonstrated an understanding of the instructions.   The patient was advised to  call back or seek an in-person evaluation if the symptoms worsen or if the condition fails to improve as anticipated.  I provided 10 minutes of non-face-to-face time during this encounter.   FONDA JONELLE MOWER, NP

## 2024-05-25 ENCOUNTER — Encounter: Payer: Self-pay | Admitting: Oncology

## 2024-05-25 ENCOUNTER — Other Ambulatory Visit: Payer: Self-pay | Admitting: *Deleted

## 2024-05-25 MED ORDER — OXYCODONE HCL 10 MG PO TABS: 10.0000 mg | ORAL_TABLET | ORAL | 0 refills | Status: DC | PRN

## 2024-05-28 ENCOUNTER — Other Ambulatory Visit: Payer: Self-pay

## 2024-05-28 ENCOUNTER — Encounter: Payer: Self-pay | Admitting: Oncology

## 2024-05-28 ENCOUNTER — Other Ambulatory Visit: Payer: Self-pay | Admitting: *Deleted

## 2024-05-28 DIAGNOSIS — G20C Parkinsonism, unspecified: Secondary | ICD-10-CM | POA: Diagnosis not present

## 2024-05-28 MED ORDER — MORPHINE SULFATE ER 15 MG PO TBCR
30.0000 mg | EXTENDED_RELEASE_TABLET | Freq: Three times a day (TID) | ORAL | 0 refills | Status: DC
  Filled 2024-05-28: qty 180, 30d supply, fill #0

## 2024-05-31 ENCOUNTER — Other Ambulatory Visit: Payer: Self-pay | Admitting: Oncology

## 2024-05-31 DIAGNOSIS — C9000 Multiple myeloma not having achieved remission: Secondary | ICD-10-CM

## 2024-06-04 ENCOUNTER — Ambulatory Visit
Admission: RE | Admit: 2024-06-04 | Discharge: 2024-06-04 | Disposition: A | Discharge status: Home / Self Care | Source: Ambulatory Visit | Attending: Thoracic Surgery (Cardiothoracic Vascular Surgery) | Admitting: Thoracic Surgery (Cardiothoracic Vascular Surgery)

## 2024-06-04 DIAGNOSIS — I7121 Aneurysm of the ascending aorta, without rupture: Secondary | ICD-10-CM | POA: Diagnosis not present

## 2024-06-04 DIAGNOSIS — I700 Atherosclerosis of aorta: Secondary | ICD-10-CM | POA: Diagnosis not present

## 2024-06-04 DIAGNOSIS — R918 Other nonspecific abnormal finding of lung field: Secondary | ICD-10-CM | POA: Diagnosis not present

## 2024-06-04 DIAGNOSIS — I2510 Atherosclerotic heart disease of native coronary artery without angina pectoris: Secondary | ICD-10-CM | POA: Diagnosis not present

## 2024-06-04 MED ORDER — IOHEXOL 350 MG/ML SOLN
75.0000 mL | Freq: Once | INTRAVENOUS | Status: AC | PRN
  Administered 2024-06-04: 75 mL via INTRAVENOUS

## 2024-06-05 DIAGNOSIS — H353231 Exudative age-related macular degeneration, bilateral, with active choroidal neovascularization: Secondary | ICD-10-CM | POA: Diagnosis not present

## 2024-06-07 ENCOUNTER — Encounter: Payer: Self-pay | Admitting: Oncology

## 2024-06-07 ENCOUNTER — Inpatient Hospital Stay: Admitting: Pharmacist

## 2024-06-07 ENCOUNTER — Inpatient Hospital Stay

## 2024-06-07 ENCOUNTER — Inpatient Hospital Stay (HOSPITAL_BASED_OUTPATIENT_CLINIC_OR_DEPARTMENT_OTHER): Admitting: Oncology

## 2024-06-07 VITALS — BP 138/81 | HR 51 | Temp 96.8°F | Resp 18 | Ht 65.0 in | Wt 176.0 lb

## 2024-06-07 DIAGNOSIS — C9001 Multiple myeloma in remission: Secondary | ICD-10-CM | POA: Diagnosis not present

## 2024-06-07 DIAGNOSIS — C9000 Multiple myeloma not having achieved remission: Secondary | ICD-10-CM

## 2024-06-07 DIAGNOSIS — M549 Dorsalgia, unspecified: Secondary | ICD-10-CM | POA: Diagnosis not present

## 2024-06-07 DIAGNOSIS — Z809 Family history of malignant neoplasm, unspecified: Secondary | ICD-10-CM | POA: Diagnosis not present

## 2024-06-07 DIAGNOSIS — D72819 Decreased white blood cell count, unspecified: Secondary | ICD-10-CM | POA: Diagnosis not present

## 2024-06-07 DIAGNOSIS — D696 Thrombocytopenia, unspecified: Secondary | ICD-10-CM | POA: Diagnosis not present

## 2024-06-07 DIAGNOSIS — N289 Disorder of kidney and ureter, unspecified: Secondary | ICD-10-CM | POA: Diagnosis not present

## 2024-06-07 DIAGNOSIS — Z87891 Personal history of nicotine dependence: Secondary | ICD-10-CM | POA: Diagnosis not present

## 2024-06-07 DIAGNOSIS — D649 Anemia, unspecified: Secondary | ICD-10-CM | POA: Diagnosis not present

## 2024-06-07 DIAGNOSIS — G8929 Other chronic pain: Secondary | ICD-10-CM | POA: Diagnosis not present

## 2024-06-07 DIAGNOSIS — Z79899 Other long term (current) drug therapy: Secondary | ICD-10-CM | POA: Diagnosis not present

## 2024-06-07 LAB — CBC WITH DIFFERENTIAL/PLATELET
Abs Immature Granulocytes: 0.01 K/uL (ref 0.00–0.07)
Basophils Absolute: 0 K/uL (ref 0.0–0.1)
Basophils Relative: 1 %
Eosinophils Absolute: 0.1 K/uL (ref 0.0–0.5)
Eosinophils Relative: 2 %
HCT: 37.5 % — ABNORMAL LOW (ref 39.0–52.0)
Hemoglobin: 12.3 g/dL — ABNORMAL LOW (ref 13.0–17.0)
Immature Granulocytes: 0 %
Lymphocytes Relative: 24 %
Lymphs Abs: 0.9 K/uL (ref 0.7–4.0)
MCH: 33.2 pg (ref 26.0–34.0)
MCHC: 32.8 g/dL (ref 30.0–36.0)
MCV: 101.4 fL — ABNORMAL HIGH (ref 80.0–100.0)
Monocytes Absolute: 0.4 K/uL (ref 0.1–1.0)
Monocytes Relative: 12 %
Neutro Abs: 2.2 K/uL (ref 1.7–7.7)
Neutrophils Relative %: 61 %
Platelets: 27 K/uL — ABNORMAL LOW (ref 150–400)
RBC: 3.7 MIL/uL — ABNORMAL LOW (ref 4.22–5.81)
RDW: 14.3 % (ref 11.5–15.5)
WBC: 3.6 K/uL — ABNORMAL LOW (ref 4.0–10.5)
nRBC: 0 % (ref 0.0–0.2)

## 2024-06-07 LAB — CMP (CANCER CENTER ONLY)
ALT: 8 U/L (ref 0–44)
AST: 23 U/L (ref 15–41)
Albumin: 3.8 g/dL (ref 3.5–5.0)
Alkaline Phosphatase: 108 U/L (ref 38–126)
Anion gap: 7 (ref 5–15)
BUN: 27 mg/dL — ABNORMAL HIGH (ref 8–23)
CO2: 26 mmol/L (ref 22–32)
Calcium: 9 mg/dL (ref 8.9–10.3)
Chloride: 103 mmol/L (ref 98–111)
Creatinine: 1.33 mg/dL — ABNORMAL HIGH (ref 0.61–1.24)
GFR, Estimated: 51 mL/min — ABNORMAL LOW (ref >60)
Glucose, Bld: 134 mg/dL — ABNORMAL HIGH (ref 70–99)
Potassium: 4.2 mmol/L (ref 3.5–5.1)
Sodium: 136 mmol/L (ref 135–145)
Total Bilirubin: 0.8 mg/dL (ref 0.0–1.2)
Total Protein: 5.8 g/dL — ABNORMAL LOW (ref 6.5–8.1)

## 2024-06-07 NOTE — Progress Notes (Signed)
 [facility]  [phone] [phone]  ID: Jack [name] OB: December 06, 1932  MR#: [id number]  RDW#:[id number]  Patient Care Team: Glover Lenis, MD as PCP - General (Family Medicine) End, Lonni, MD as PCP - Cardiology (Cardiology) Jana Lavenia SQUIBB, MD (Endocrinology) Jacobo Evalene PARAS, MD as Consulting Physician (Oncology)   CHIEF COMPLAINT: Stage II kappa chain myeloma, in remission.  INTERVAL HISTORY: Patient returns to clinic today for repeat laboratory work and further evaluation.  Patient continues to have chronic back pain, but otherwise feels well.  He has no neurologic complaints.  He denies any recent fevers or illnesses.  He has a good appetite and denies weight loss.  He denies chest pain, shortness of breath, cough, or hemoptysis.  He denies any nausea, vomiting, constipation, or diarrhea.  He has no urinary complaints.  Patient offers no further specific complaints today.  REVIEW OF SYSTEMS:   Review of Systems  Constitutional: Negative.  Negative for fever and malaise/fatigue.  Respiratory: Negative.  Negative for cough, hemoptysis and shortness of breath.   Cardiovascular: Negative.  Negative for chest pain and leg swelling.  Gastrointestinal: Negative.  Negative for abdominal pain.  Genitourinary: Negative.  Negative for dysuria and flank pain.  Musculoskeletal:  Positive for back pain. Negative for falls and joint pain.  Skin: Negative.  Negative for rash.  Neurological: Negative.  Negative for dizziness, focal weakness, weakness and headaches.  Psychiatric/Behavioral: Negative.  The patient is not nervous/anxious.     As per HPI. Otherwise, a complete review of systems is negative.  PAST MEDICAL HISTORY: Past Medical History:  Diagnosis Date   Ascending aortic aneurysm    a. 12/2020 Echo: Asc Ao 48mm, Ao root 40mm. b. 03/2022 Asc Ao 4.6 cm (4.5 cm in 2019)   CKD (chronic kidney disease), stage III (HCC)     Compression fracture of body of thoracic vertebra (HCC)    Diastolic dysfunction    a. 12/2020 Echo: EF 50-55%, no rwma, mild LVH, GrI DD, nl RV fxn, mild AI. Asc Ao 48mm, Ao root 40mm.   Elevated prostate specific antigen (PSA)    has been 7 for a year    GERD (gastroesophageal reflux disease)    History of colon polyps 2008   [facility],    History of kidney stones    Hyperlipidemia    Hypertension    Myeloma (HCC)    PAF (paroxysmal atrial fibrillation) (HCC)    a. 01/2021-->Eliquis  (CHA2DS2VASc = 3-4).   Pain    Prostate hypertrophy    diagnosed at age 69 due to hematospermia    PAST SURGICAL HISTORY: Past Surgical History:  Procedure Laterality Date   CATARACT EXTRACTION W/PHACO Left 01/10/2018   Procedure: CATARACT EXTRACTION PHACO AND INTRAOCULAR LENS PLACEMENT (IOC);  Surgeon: Jaye Fallow, MD;  Location: [facility] ORS;  Service: Ophthalmology;  Laterality: Left;  US  00:24.8 AP% 14.9 CDE 3.68 Fluid pack lot # [id number]   CATARACT EXTRACTION W/PHACO Right 01/25/2018   Procedure: CATARACT EXTRACTION PHACO AND INTRAOCULAR LENS PLACEMENT (IOC);  Surgeon: Jaye Fallow, MD;  Location: [facility] OR[facility];  Service: Ophthalmology;  Laterality: Right;  US  00:42 AP% 10.8 CDE 4.59 Fluid pack lot # [id number]   COLON SURGERY     CYSTOSCOPY W/ URETERAL STENT PLACEMENT Right 10/16/2017   Procedure: right  URETERAL STENT PLACEMENT,cystoscopy bilateral stent removal,rretrograde;  Surgeon: Twylla Glendia BROCKS, MD;  Location: [facility] OR[facility];  Service: Urology;  Laterality: Right;   CYSTOSCOPY/URETEROSCOPY/HOLMIUM LASER/STENT PLACEMENT Right 12/16/2020   Procedure: CYSTOSCOPY/URETEROSCOPY/HOLMIUM  LASER/STENT PLACEMENT;  Surgeon: Twylla Glendia BROCKS, MD;  Location: [facility] OR[facility];  Service: Urology;  Laterality: Right;   EXTRACORPOREAL SHOCK WAVE LITHOTRIPSY Right 12/11/2020   Procedure: EXTRACORPOREAL SHOCK WAVE LITHOTRIPSY (ESWL);  Surgeon: Twylla Glendia BROCKS, MD;  Location: [facility] OR[facility];  Service: Urology;  Laterality:  Right;   IR BONE MARROW BIOPSY & ASPIRATION  04/26/2024   IR BONE TUMOR(S)RF ABLATION  04/16/2022   IR KYPHO EA ADDL LEVEL THORACIC OR LUMBAR  02/02/2021   IR KYPHO LUMBAR INC FX REDUCE BONE BX UNI/BIL CANNULATION INC/IMAGING  02/02/2021   IR KYPHO THORACIC WITH BONE BIOPSY  04/15/2022   KIDNEY STONE SURGERY     KYPHOPLASTY N/A 03/12/2021   Procedure: CLARESSA ONA, L3;  Surgeon: Kathlynn Sharper, MD;  Location: [facility] ORS;  Service: Orthopedics;  Laterality: N/A;   RESECTION SOFT TISSUE TUMOR LEG / ANKLE RADICAL  jan 2009   [facility],  right thigh/knee , nonmalignant   SMALL INTESTINE SURGERY  1946   implaed on picket fence, punctured stomach   TONSILLECTOMY      FAMILY HISTORY: Family History  Problem Relation Age of Onset   Hypertension Father    Hyperlipidemia Father    Cancer Sister        thyroid  - dx in late 20's    ADVANCED DIRECTIVES (Y/N):  N  HEALTH MAINTENANCE: Social History   Tobacco Use   Smoking status: Former    Current packs/day: 0.00    Types: Cigarettes    Quit date: 07/05/1965    Years since quitting: 58.9   Smokeless tobacco: Current   Tobacco comments:    Occasional cigar  Vaping Use   Vaping status: Never Used  Substance Use Topics   Alcohol use: Not Currently    Alcohol/week: 1.0 standard drink of alcohol    Types: 1 Standard drinks or equivalent per week    Comment: occassionaly   Drug use: No     Colonoscopy:  PAP:  Bone density:  Lipid panel:  Allergies  Allergen Reactions   Quinolones     Aortic dilation contraindicates FQ   Amlodipine  Swelling and Other (See Comments)    LE edema   Azithromycin  Nausea And Vomiting   Lipitor [Atorvastatin ]     Muscle pain in RT Leg    Lisinopril  Cough   Zetia  [Ezetimibe ]     Pain in legs    Hydromorphone  Hcl     Wife states Medication makes extremely sedated and lethargic    Current Outpatient Medications  Medication Sig Dispense Refill   Alpha-Lipoic Acid 600 MG CAPS Take by mouth.     Ascorbic  Acid (VITAMIN C) 1000 MG tablet Take 1,000 mg by mouth in the morning and at bedtime.     Calcium  Carb-Cholecalciferol  (OYSTER SHELL CALCIUM  W/D) 500-5 MG-MCG TABS Take 1 tablet by mouth in the morning and at bedtime.     carvedilol  (COREG ) 12.5 MG tablet Take 0.5 tablets (6.25 mg total) by mouth 2 (two) times daily. 180 tablet 3   cyanocobalamin  (VITAMIN B12) 1000 MCG tablet Take 1,000 mcg by mouth daily.     dexamethasone  (DECADRON ) 4 MG tablet TAKE 5 TABLETS BY MOUTH ONCE A WEEK. 60 tablet 2   ELIQUIS  5 MG TABS tablet TAKE 1 TABLET BY MOUTH TWICE A DAY 180 tablet 0   gabapentin  (NEURONTIN ) 300 MG capsule TAKE 2 CAPSULES BY MOUTH 3 TIMES DAILY. 180 capsule 2   ibuprofen  (ADVIL ) 200 MG tablet Take 200 mg by mouth 2 (two) times  daily as needed.     lidocaine  (XYLOCAINE ) 5 % ointment Apply topically 3 (three) times daily as needed for mild pain (pain score 1-3) or moderate pain (pain score 4-6). 50 g 2   morphine  (MS CONTIN ) 15 MG 12 hr tablet Take 2 tablets (30 mg total) by mouth every 8 (eight) hours. 180 tablet 0   Multiple Vitamin (MULTIVITAMIN WITH MINERALS) TABS tablet Take 1 tablet by mouth daily.     Multiple Vitamins-Minerals (PRESERVISION AREDS PO) Take 1 capsule by mouth in the morning and at bedtime.     naloxone  (NARCAN ) nasal spray 4 mg/0.1 mL SPRAY 1 SPRAY INTO ONE NOSTRIL AS DIRECTED FOR OPIOID OVERDOSE (TURN PERSON ON SIDE AFTER DOSE. IF NO RESPONSE IN 2-3 MINUTES OR PERSON RESPONDS BUT RELAPSES, REPEAT USING A NEW SPRAY DEVICE AND SPRAY INTO THE OTHER NOSTRIL. CALL 911 AFTER USE.) * EMERGENCY USE ONLY * 1 each 0   omeprazole  (PRILOSEC) 20 MG capsule TAKE 1 CAPSULE BY MOUTH EVERY DAY 90 capsule 3   Oxycodone  HCl 10 MG TABS Take 1 tablet (10 mg total) by mouth every 4 (four) hours as needed (pain). 90 tablet 0   polyethylene glycol (MIRALAX  / GLYCOLAX ) 17 g packet Take 17 g by mouth 2 (two) times daily.  0   potassium citrate  (UROCIT-K ) 10 MEQ (1080 MG) SR tablet TAKE 1 TABLET (10 MEQ  TOTAL) BY MOUTH 3 (THREE) TIMES DAILY WITH MEALS. 300 tablet 2   senna-docusate (SENOKOT-S) 8.6-50 MG tablet Take 1 tablet by mouth 2 (two) times daily. 30 tablet 0   lenalidomide  (REVLIMID ) 15 MG capsule Take 1 capsule (15 mg total) by mouth daily. Take for 21 days, then hold for 7 days. Repeat every 28 days. (Patient not taking: Reported on 06/07/2024) 21 capsule 0   No current facility-administered medications for this visit.    OBJECTIVE: Vitals:   06/07/24 1036  BP: 138/81  Pulse: (!) 51  Resp: 18  Temp: (!) 96.8 F (36 C)  SpO2: 95%       Body mass index is 29.29 kg/m.    ECOG FS:1 - Symptomatic but completely ambulatory  General: Well-developed, well-nourished, no acute distress. Eyes: Pink conjunctiva, anicteric sclera. HEENT: Normocephalic, moist mucous membranes. Lungs: No audible wheezing or coughing. Heart: Regular rate and rhythm. Abdomen: Soft, nontender, no obvious distention. Musculoskeletal: No edema, cyanosis, or clubbing. Neuro: Alert, answering all questions appropriately. Cranial nerves grossly intact. Skin: No rashes or petechiae noted. Psych: Normal affect.  LAB RESULTS:  Lab Results  Component Value Date   NA 136 06/07/2024   K 4.2 06/07/2024   CL 103 06/07/2024   CO2 26 06/07/2024   GLUCOSE 134 (H) 06/07/2024   BUN 27 (H) 06/07/2024   CREATININE 1.33 (H) 06/07/2024   CALCIUM  9.0 06/07/2024   PROT 5.8 (L) 06/07/2024   ALBUMIN 3.8 06/07/2024   AST 23 06/07/2024   ALT 8 06/07/2024   ALKPHOS 108 06/07/2024   BILITOT 0.8 06/07/2024   GFRNONAA 51 (L) 06/07/2024   GFRAA 29 (L) 10/16/2017    Lab Results  Component Value Date   WBC 3.6 (L) 06/07/2024   NEUTROABS 2.2 06/07/2024   HGB 12.3 (L) 06/07/2024   HCT 37.5 (L) 06/07/2024   MCV 101.4 (H) 06/07/2024   PLT 27 (L) 06/07/2024     STUDIES: CT ANGIO CHEST AORTA W/CM & OR WO/CM Result Date: 06/06/2024 CLINICAL DATA:  Aortic aneurysm suspected. EXAM: CT ANGIOGRAPHY CHEST WITH CONTRAST  TECHNIQUE: Multidetector CT imaging of  the chest was performed using the standard protocol during bolus administration of intravenous contrast. Multiplanar CT image reconstructions and MIPs were obtained to evaluate the vascular anatomy. RADIATION DOSE REDUCTION: This exam was performed according to the departmental dose-optimization program which includes automated exposure control, adjustment of the mA and/or kV according to patient size and/or use of iterative reconstruction technique. CONTRAST:  75mL OMNIPAQUE  IOHEXOL  350 MG/ML SOLN COMPARISON:  CT angiography chest from 05/02/2023. FINDINGS: Cardiovascular:  Preferential opacification of the thoracic aorta. Thoracic aortic dimensions are as follows: *Sinoatrial junction: 2.5 cm. *Aortic root at Sinus of Valsalva: 4.3 cm *Sinotubular junction: 3.8 cm. *Maximum outer diameter of Ascending Aorta: 4.7 cm. No significant interval change since the prior study. *Maximum outer diameter of Descending Aorta: 3.1 cm. *Maximum outer diameter of Aorta at the Aortic hiatus: 2.3 cm. No thoracic aortic intramural hematoma, penetrating atherosclerotic ulcer or vasculitis. There is satisfactory opacification the bilateral pulmonary arteries as well. No embolism seen up to the proximal subsegmental pulmonary artery level. The brachiocephalic, subclavians, proximal common carotid and proximal vertebral arteries are patent. Normal cardiac size. No pericardial effusion. There are coronary artery calcifications, in keeping with coronary artery disease. There are also mild peripheral atherosclerotic vascular calcifications of thoracic aorta and its major branches. Mediastinum/Nodes: Visualized thyroid  gland appears grossly unremarkable. No solid / cystic mediastinal masses. The esophagus is nondistended precluding optimal assessment. There is mild circumferential thickening of the lower thoracic esophagus, which is most likely seen in the settings of chronic gastroesophageal reflux  disease versus esophagitis. No axillary, mediastinal or hilar lymphadenopathy by size criteria. Lungs/Pleura: The central tracheo-bronchial tree is patent. There are dependent changes in bilateral lungs. No mass or consolidation. No pleural effusion or pneumothorax. There is a new small grouping of sub 4 mm sized nodules in the right upper lobe (series 7, image 60). This is nonspecific and differential diagnosis includes bronchiolitis, infection, etc. Upper Abdomen: There is a small sliding hiatal hernia. Remaining visualized upper abdominal viscera within normal limits. Musculoskeletal: The visualized soft tissues of the chest wall are grossly unremarkable. No suspicious osseous lesions. There are mild to moderate multilevel degenerative changes in the visualized spine. Multiple lower thoracic upper lumbar vertebroplasties noted. There is diffuse osteopenia of the visualized osseous structures. Review of the MIP images confirms the above findings. IMPRESSION: 1. No thoracic aortic intramural hematoma, penetrating atherosclerotic ulcer or vasculitis. There is aneurysmal dilation of ascending thoracic aorta measuring up to 4.7 cm in diameter. No significant interval change since the prior study. 2. There is a new small grouping of sub 4 mm sized nodules in the right upper lobe. This is nonspecific and differential diagnosis includes bronchiolitis, infection, etc. 3. Multiple other nonacute observations, as described above. Aortic aneurysm NOS (ICD10-I71.9). Aortic Atherosclerosis (ICD10-I70.0). Electronically Signed   By: Ree Molt M.D.   On: 06/06/2024 15:26     ASSESSMENT: Stage II Kappa chain myeloma, in remission.  PLAN:    Stage II kappa chain myeloma: (11:14 translocation, high risk) SPEP essentially negative and immunoglobulins are within normal limits.  Patient's kappa light chains initially improved from greater than 5400 down to 39.1.  PET scan results from December 08, 2021 reviewed independently  with no obvious evidence of progressive disease.  Nuclear med bone scan results from December 28, 2022 did not reveal any concerning pathology.  Bone marrow biopsy from April 26, 2024 did not reveal any evidence of disease.  Cytogenetics was reported as normal.  His most recent kappa free light chain  was 187.6.  Today's result is pending.  Patient discontinued Revlimid  in August 2025 and has been instructed to continue to hold treatment.  No further intervention is needed.  Return to clinic in 3 months with repeat laboratory work and further evaluation.  Can consider reinitiating Revlimid  if patient's platelet count improves.    Anemia: Mild.  Patient's hemoglobin is 12.3.SABRA Leukopenia: Mild, monitor. Thrombocytopenia: Chronic and unchanged.  Patient's platelet count is 27 today.  Bone marrow biopsy results as above.  Continue to hold Revlimid .   Bone lesions: Recent nuclear med bone scan as above with no obvious evidence of progressive disease.  Xgeva  has been discontinued. Pain: Chronic and unchanged.  Continue injections as per pain clinic.  Nuclear medicine bone scan completed on December 28, 2022 did not report any active areas of myeloma.  Patient only takes long-acting morphine  30 mg every 8 hours.  He also takes Tylenol  and ibuprofen  sparingly.  Previously he was instructed that if his platelets fall below 50 this would need to be discontinued.  Radiation oncology has determined no further treatments are needed at this time.   Renal insufficiency: Mildly improved.  Patient's most recent creatinine is 1.33.  Patient expressed understanding and was in agreement with this plan. He also understands that He can call clinic at any time with any questions, concerns, or complaints.     Cancer Staging  Kappa light chain myeloma (HCC) Staging form: Plasma Cell Myeloma and Plasma Cell Disorders, AJCC 8th Edition - Clinical stage from 04/16/2021: Albumin (g/dL): 3.8, ISS: Stage II, High-risk cytogenetics: Absent,  LDH: Unknown - Signed by Jacobo Evalene PARAS, MD on 04/16/2021 Albumin range (g/dL): Greater than or equal to 3.5 Cytogenetics: t(11;14) translocation Serum calcium  level: Normal Serum creatinine level: Normal Bone disease on imaging: Present  Evalene PARAS Jacobo, MD   06/07/2024 2:38 PM

## 2024-06-07 NOTE — Progress Notes (Signed)
 Patient just wants to know if he has to start back on the revlimid .

## 2024-06-07 NOTE — Progress Notes (Signed)
 Oral Chemotherapy Clinic [facility]  Telephone:([phone] [phone]  Patient Care Team: Glover Lenis, MD as PCP - General (Family Medicine) End, Lonni, MD as PCP - Cardiology (Cardiology) Jana Lavenia SQUIBB, MD (Endocrinology) Jacobo Evalene PARAS, MD as Consulting Physician (Oncology)   Name of the patient: Jack [name]  [id number]  1933/06/05   Encounter Date 06/07/2024  HPI: Patient is a 89 y.o. male with newly diagnosed multiple myeloma. Currently treated with Revlimid  (lenalidomide ) and dexamethasone . Patient was initiated on an all oral regimen because he was unable to physically make it into clinic at the time treatment due to his disease/performance status. His status improved he was able to come back to inperson appts on 07/14/21. Lenalidomide  was dose reduced to 15 mg daily 21on/7off on 11/07/23 due to renal function. Lenalidomide  was held on 04/10/24 due to increased fatigue and thrombocytopenia.  Reason for Consult: Oral chemotherapy follow-up for lenalidomide  therapy.   PAST MEDICAL HISTORY: Past Medical History:  Diagnosis Date   Ascending aortic aneurysm    a. 12/2020 Echo: Asc Ao 48mm, Ao root 40mm. b. 03/2022 Asc Ao 4.6 cm (4.5 cm in 2019)   CKD (chronic kidney disease), stage III (HCC)    Compression fracture of body of thoracic vertebra (HCC)    Diastolic dysfunction    a. 12/2020 Echo: EF 50-55%, no rwma, mild LVH, GrI DD, nl RV fxn, mild AI. Asc Ao 48mm, Ao root 40mm.   Elevated prostate specific antigen (PSA)    has been 7 for a year    GERD (gastroesophageal reflux disease)    History of colon polyps 2008   [facility],    History of kidney stones    Hyperlipidemia    Hypertension    Myeloma (HCC)    PAF (paroxysmal atrial fibrillation) (HCC)    a. 01/2021-->Eliquis  (CHA2DS2VASc = 3-4).   Pain    Prostate hypertrophy    diagnosed at age 41 due to hematospermia    HEMATOLOGY/ONCOLOGY HISTORY:  Oncology History   Kappa light chain myeloma (HCC)  03/12/2021 Initial Diagnosis   Kappa light chain myeloma (HCC)   03/27/2021 - 04/06/2021 Chemotherapy   Patient is on Treatment Plan : MYELOMA NON-TRANSPLANT CANDIDATES VRd SQ q21d      04/16/2021 Cancer Staging   Staging form: Plasma Cell Myeloma and Plasma Cell Disorders, AJCC 8th Edition - Clinical stage from 04/16/2021: Albumin (g/dL): 3.8, ISS: Stage II, High-risk cytogenetics: Absent, LDH: Unknown - Signed by Jacobo Evalene PARAS, MD on 04/16/2021 Albumin range (g/dL): Greater than or equal to 3.5 Cytogenetics: t(11;14) translocation Serum calcium  level: Normal Serum creatinine level: Normal Bone disease on imaging: Present     ALLERGIES:  is allergic to quinolones, amlodipine , azithromycin , lipitor [atorvastatin ], lisinopril , zetia  [ezetimibe ], and hydromorphone  hcl.  MEDICATIONS:  Current Outpatient Medications  Medication Sig Dispense Refill   Alpha-Lipoic Acid 600 MG CAPS Take by mouth.     Ascorbic Acid  (VITAMIN C) 1000 MG tablet Take 1,000 mg by mouth in the morning and at bedtime.     Calcium  Carb-Cholecalciferol  (OYSTER SHELL CALCIUM  W/D) 500-5 MG-MCG TABS Take 1 tablet by mouth in the morning and at bedtime.     carvedilol  (COREG ) 12.5 MG tablet Take 0.5 tablets (6.25 mg total) by mouth 2 (two) times daily. 180 tablet 3   cyanocobalamin  (VITAMIN B12) 1000 MCG tablet Take 1,000 mcg by mouth daily.     dexamethasone  (DECADRON ) 4 MG tablet TAKE 5 TABLETS BY MOUTH ONCE A WEEK. 60 tablet 2  ELIQUIS  5 MG TABS tablet TAKE 1 TABLET BY MOUTH TWICE A DAY 180 tablet 0   gabapentin  (NEURONTIN ) 300 MG capsule TAKE 2 CAPSULES BY MOUTH 3 TIMES DAILY. 180 capsule 2   ibuprofen  (ADVIL ) 200 MG tablet Take 200 mg by mouth 2 (two) times daily as needed.     lenalidomide  (REVLIMID ) 15 MG capsule Take 1 capsule (15 mg total) by mouth daily. Take for 21 days, then hold for 7 days. Repeat every 28 days. (Patient not taking: Reported on 06/07/2024) 21 capsule 0    lidocaine  (XYLOCAINE ) 5 % ointment Apply topically 3 (three) times daily as needed for mild pain (pain score 1-3) or moderate pain (pain score 4-6). 50 g 2   morphine  (MS CONTIN ) 15 MG 12 hr tablet Take 2 tablets (30 mg total) by mouth every 8 (eight) hours. 180 tablet 0   Multiple Vitamin (MULTIVITAMIN WITH MINERALS) TABS tablet Take 1 tablet by mouth daily.     Multiple Vitamins-Minerals (PRESERVISION AREDS PO) Take 1 capsule by mouth in the morning and at bedtime.     naloxone  (NARCAN ) nasal spray 4 mg/0.1 mL SPRAY 1 SPRAY INTO ONE NOSTRIL AS DIRECTED FOR OPIOID OVERDOSE (TURN PERSON ON SIDE AFTER DOSE. IF NO RESPONSE IN 2-3 MINUTES OR PERSON RESPONDS BUT RELAPSES, REPEAT USING A NEW SPRAY DEVICE AND SPRAY INTO THE OTHER NOSTRIL. CALL 911 AFTER USE.) * EMERGENCY USE ONLY * 1 each 0   omeprazole  (PRILOSEC) 20 MG capsule TAKE 1 CAPSULE BY MOUTH EVERY DAY 90 capsule 3   Oxycodone  HCl 10 MG TABS Take 1 tablet (10 mg total) by mouth every 4 (four) hours as needed (pain). 90 tablet 0   polyethylene glycol (MIRALAX  / GLYCOLAX ) 17 g packet Take 17 g by mouth 2 (two) times daily.  0   potassium citrate  (UROCIT-K ) 10 MEQ (1080 MG) SR tablet TAKE 1 TABLET (10 MEQ TOTAL) BY MOUTH 3 (THREE) TIMES DAILY WITH MEALS. 300 tablet 2   senna-docusate (SENOKOT-S) 8.6-50 MG tablet Take 1 tablet by mouth 2 (two) times daily. 30 tablet 0   No current facility-administered medications for this visit.    VITAL SIGNS: There were no vitals taken for this visit. There were no vitals filed for this visit.    Estimated body mass index is 29.29 kg/m as calculated from the following:   Height as of an earlier encounter on 06/07/24: 5' 5 (1.651 m).   Weight as of an earlier encounter on 06/07/24: 79.8 kg (176 lb).  LABS: CBC:    Component Value Date/Time   WBC 3.6 (L) 06/07/2024 1003   HGB 12.3 (L) 06/07/2024 1003   HGB 12.7 (L) 04/17/2024 1020   HGB 11.0 (L) 02/24/2021 1014   HCT 37.5 (L) 06/07/2024 1003   HCT  35.3 (L) 02/24/2021 1014   PLT 27 (L) 06/07/2024 1003   PLT 21 (L) 04/17/2024 1020   PLT 249 02/24/2021 1014   MCV 101.4 (H) 06/07/2024 1003   MCV 96 02/24/2021 1014   MCV 90 01/24/2013 0819   NEUTROABS 2.2 06/07/2024 1003   NEUTROABS 7.4 (H) 01/24/2013 0819   LYMPHSABS 0.9 06/07/2024 1003   LYMPHSABS 1.2 01/24/2013 0819   MONOABS 0.4 06/07/2024 1003   MONOABS 0.3 01/24/2013 0819   EOSABS 0.1 06/07/2024 1003   EOSABS 0.0 01/24/2013 0819   BASOSABS 0.0 06/07/2024 1003   BASOSABS 0.0 01/24/2013 0819   Comprehensive Metabolic Panel:    Component Value Date/Time   NA 136 06/07/2024 1003  NA 136 02/24/2021 1014   NA 138 01/24/2013 0819   K 4.2 06/07/2024 1003   K 3.8 01/24/2013 0819   CL 103 06/07/2024 1003   CL 105 01/24/2013 0819   CO2 26 06/07/2024 1003   CO2 25 01/24/2013 0819   BUN 27 (H) 06/07/2024 1003   BUN 23 02/24/2021 1014   BUN 20 (H) 01/24/2013 0819   CREATININE 1.33 (H) 06/07/2024 1003   CREATININE 1.52 (H) 01/24/2013 0819   GLUCOSE 134 (H) 06/07/2024 1003   GLUCOSE 171 (H) 01/24/2013 0819   CALCIUM  9.0 06/07/2024 1003   CALCIUM  10.4 (H) 02/19/2021 1229   AST 23 06/07/2024 1003   ALT 8 06/07/2024 1003   ALT 15 01/24/2013 0819   ALKPHOS 108 06/07/2024 1003   ALKPHOS 74 01/24/2013 0819   BILITOT 0.8 06/07/2024 1003   PROT 5.8 (L) 06/07/2024 1003   PROT 6.2 02/24/2021 1014   PROT 6.3 (L) 01/24/2013 0819   ALBUMIN 3.8 06/07/2024 1003   ALBUMIN 4.5 02/24/2021 1014   ALBUMIN 3.6 01/24/2013 0819     Present during today's visit: Patient and his wife [name]  Assessment and Plan: CBC/CMP reviewed, patient continues to have thrombocytopenia therefore he will continue to hold lenalidomide   His fatigue has improved since holding lenalidomide    Oral Chemotherapy Adherence: N/A treatment on hold No patient barriers to medication adherence identified.   New medications: N/A treatment on hold  Medication Access Issues: N/A treatment on hold  Patient  expressed understanding and was in agreement with this plan. He also understands that He can call clinic at any time with any questions, concerns, or complaints.   Follow-up plan: RTC in 3 months  Thank you for allowing me to participate in the care of this very pleasant patient.   Time Total: 15 mins  Visit consisted of counseling and education on dealing with issues of symptom management in the setting of serious and potentially life-threatening illness.Greater than 50%  of this time was spent counseling and coordinating care related to the above assessment and plan.  Signed by: Priyanka Causey N. Sydelle Sherfield, PharmD, NEILA, CPP Hematology/Oncology Clinical Pharmacist Practitioner [facility] [phone]

## 2024-06-11 LAB — KAPPA/LAMBDA LIGHT CHAINS
Kappa free light chain: 346.2 mg/L — ABNORMAL HIGH (ref 3.3–19.4)
Kappa, lambda light chain ratio: 52.45 — ABNORMAL HIGH (ref 0.26–1.65)
Lambda free light chains: 6.6 mg/L (ref 5.7–26.3)

## 2024-06-12 ENCOUNTER — Encounter: Payer: Self-pay | Admitting: Oncology

## 2024-06-12 ENCOUNTER — Ambulatory Visit

## 2024-06-12 DIAGNOSIS — I7121 Aneurysm of the ascending aorta, without rupture: Secondary | ICD-10-CM | POA: Diagnosis not present

## 2024-06-12 NOTE — Progress Notes (Addendum)
 [address] [place] [address]             [phone]       CARDIOTHORACIC SURGERY TELEPHONE VIRTUAL OFFICE NOTE  Referring Provider is Glover Lenis, MD Primary Cardiologist is Lonni Hanson, MD PCP is Glover Lenis, MD   HPI:  I spoke with Jack [name] (DOB 04/03/1933 ) via telephone on 06/12/2024 at 10:28 AM and verified that I was speaking with the correct person using more than one form of identification.  We discussed the fact that I was contacting them from my office and they were located at home, as well as the reason(s) for conducting our visit virtually instead of in-person.  The patient expressed understanding the circumstances and agreed to proceed as described.  Jack [name] is a 89 year old man with medical history of hypertension, hyperlipidemia, paroxysmal atrial fibrillation, multiple myeloma, stage III chronic kidney disease, vertebral compression fractures, reflux, and BPH.  He is joined on the virtual visit with his wife [name].  He is followed by our clinic for an ascending thoracic aortic aneurysm.  It has stayed stable in size.  Recent CTA of chest measured aneurysm at 4.7 cm. He reports that he is doing well.  His blood pressure is well controlled.  He walks almost daily and lifts 3 pounds weights. He denies chest pain, shortness of breath and lower leg swelling.    Current Outpatient Medications  Medication Sig Dispense Refill   Alpha-Lipoic Acid 600 MG CAPS Take by mouth.     Ascorbic Acid  (VITAMIN C) 1000 MG tablet Take 1,000 mg by mouth in the morning and at bedtime.     Calcium  Carb-Cholecalciferol  (OYSTER SHELL CALCIUM  W/D) 500-5 MG-MCG TABS Take 1 tablet by mouth in the morning and at bedtime.     carvedilol  (COREG ) 12.5 MG tablet Take 0.5 tablets (6.25 mg total) by mouth 2 (two) times daily. 180 tablet 3   cyanocobalamin  (VITAMIN B12) 1000 MCG tablet Take 1,000 mcg by mouth daily.     dexamethasone  (DECADRON ) 4  MG tablet TAKE 5 TABLETS BY MOUTH ONCE A WEEK. 60 tablet 2   ELIQUIS  5 MG TABS tablet TAKE 1 TABLET BY MOUTH TWICE A DAY 180 tablet 0   gabapentin  (NEURONTIN ) 300 MG capsule TAKE 2 CAPSULES BY MOUTH 3 TIMES DAILY. 180 capsule 2   ibuprofen  (ADVIL ) 200 MG tablet Take 200 mg by mouth 2 (two) times daily as needed.     lenalidomide  (REVLIMID ) 15 MG capsule Take 1 capsule (15 mg total) by mouth daily. Take for 21 days, then hold for 7 days. Repeat every 28 days. (Patient not taking: Reported on 06/07/2024) 21 capsule 0   lidocaine  (XYLOCAINE ) 5 % ointment Apply topically 3 (three) times daily as needed for mild pain (pain score 1-3) or moderate pain (pain score 4-6). 50 g 2   morphine  (MS CONTIN ) 15 MG 12 hr tablet Take 2 tablets (30 mg total) by mouth every 8 (eight) hours. 180 tablet 0   Multiple Vitamin (MULTIVITAMIN WITH MINERALS) TABS tablet Take 1 tablet by mouth daily.     Multiple Vitamins-Minerals (PRESERVISION AREDS PO) Take 1 capsule by mouth in the morning and at bedtime.     naloxone  (NARCAN ) nasal spray 4 mg/0.1 mL SPRAY 1 SPRAY INTO ONE NOSTRIL AS DIRECTED FOR OPIOID OVERDOSE (TURN PERSON ON SIDE AFTER DOSE. IF NO RESPONSE IN 2-3 MINUTES OR PERSON RESPONDS BUT RELAPSES, REPEAT  USING A NEW SPRAY DEVICE AND SPRAY INTO THE OTHER NOSTRIL. CALL 911 AFTER USE.) * EMERGENCY USE ONLY * 1 each 0   omeprazole  (PRILOSEC) 20 MG capsule TAKE 1 CAPSULE BY MOUTH EVERY DAY 90 capsule 3   Oxycodone  HCl 10 MG TABS Take 1 tablet (10 mg total) by mouth every 4 (four) hours as needed (pain). 90 tablet 0   polyethylene glycol (MIRALAX  / GLYCOLAX ) 17 g packet Take 17 g by mouth 2 (two) times daily.  0   potassium citrate  (UROCIT-K ) 10 MEQ (1080 MG) SR tablet TAKE 1 TABLET (10 MEQ TOTAL) BY MOUTH 3 (THREE) TIMES DAILY WITH MEALS. 300 tablet 2   senna-docusate (SENOKOT-S) 8.6-50 MG tablet Take 1 tablet by mouth 2 (two) times daily. 30 tablet 0   No current facility-administered medications for this visit.    Review of Systems  Constitutional:  Negative for fever, malaise/fatigue and weight loss.  HENT:  Negative for congestion.   Respiratory:  Negative for cough, shortness of breath and wheezing.   Cardiovascular:  Negative for chest pain, palpitations and leg swelling.  Neurological:  Negative for headaches.  All other systems reviewed and are negative.   Diagnostic Tests:  CLINICAL DATA:  Aortic aneurysm suspected.   EXAM: CT ANGIOGRAPHY CHEST WITH CONTRAST   TECHNIQUE: Multidetector CT imaging of the chest was performed using the standard protocol during bolus administration of intravenous contrast. Multiplanar CT image reconstructions and MIPs were obtained to evaluate the vascular anatomy.   RADIATION DOSE REDUCTION: This exam was performed according to the departmental dose-optimization program which includes automated exposure control, adjustment of the mA and/or kV according to patient size and/or use of iterative reconstruction technique.   CONTRAST:  75mL OMNIPAQUE  IOHEXOL  350 MG/ML SOLN   COMPARISON:  CT angiography chest from 05/02/2023.   FINDINGS: Cardiovascular:  Preferential opacification of the thoracic aorta.   Thoracic aortic dimensions are as follows:   *Sinoatrial junction: 2.5 cm. *Aortic root at Sinus of Valsalva: 4.3 cm *Sinotubular junction: 3.8 cm. *Maximum outer diameter of Ascending Aorta: 4.7 cm. No significant interval change since the prior study. *Maximum outer diameter of Descending Aorta: 3.1 cm. *Maximum outer diameter of Aorta at the Aortic hiatus: 2.3 cm.   No thoracic aortic intramural hematoma, penetrating atherosclerotic ulcer or vasculitis.   There is satisfactory opacification the bilateral pulmonary arteries as well. No embolism seen up to the proximal subsegmental pulmonary artery level.   The brachiocephalic, subclavians, proximal common carotid and proximal vertebral arteries are patent.   Normal cardiac size. No  pericardial effusion. There are coronary artery calcifications, in keeping with coronary artery disease. There are also mild peripheral atherosclerotic vascular calcifications of thoracic aorta and its major branches.   Mediastinum/Nodes: Visualized thyroid  gland appears grossly unremarkable. No solid / cystic mediastinal masses. The esophagus is nondistended precluding optimal assessment. There is mild circumferential thickening of the lower thoracic esophagus, which is most likely seen in the settings of chronic gastroesophageal reflux disease versus esophagitis. No axillary, mediastinal or hilar lymphadenopathy by size criteria.   Lungs/Pleura: The central tracheo-bronchial tree is patent. There are dependent changes in bilateral lungs. No mass or consolidation. No pleural effusion or pneumothorax. There is a new small grouping of sub 4 mm sized nodules in the right upper lobe (series 7, image 60). This is nonspecific and differential diagnosis includes bronchiolitis, infection, etc.   Upper Abdomen: There is a small sliding hiatal hernia. Remaining visualized upper abdominal viscera within normal limits.   Musculoskeletal:  The visualized soft tissues of the chest wall are grossly unremarkable. No suspicious osseous lesions. There are mild to moderate multilevel degenerative changes in the visualized spine. Multiple lower thoracic upper lumbar vertebroplasties noted. There is diffuse osteopenia of the visualized osseous structures.   Review of the MIP images confirms the above findings.   IMPRESSION: 1. No thoracic aortic intramural hematoma, penetrating atherosclerotic ulcer or vasculitis. There is aneurysmal dilation of ascending thoracic aorta measuring up to 4.7 cm in diameter. No significant interval change since the prior study. 2. There is a new small grouping of sub 4 mm sized nodules in the right upper lobe. This is nonspecific and differential diagnosis includes  bronchiolitis, infection, etc. 3. Multiple other nonacute observations, as described above.   Aortic aneurysm NOS (ICD10-I71.9).   Aortic Atherosclerosis (ICD10-I70.0).     Electronically Signed   By: Ree Molt M.D.   On: 06/06/2024 15:26     Plan: Aneurysm of ascending aorta without rupture --4.7 ascending thoracic aortic aneurysm on CTA of chest. We discussed the natural history and and risk factors for growth of ascending aortic aneurysms. Discussed recommendations to minimize the risk of further expansion or dissection including careful blood pressure control, avoidance of contact sports and heavy lifting, attention to lipid management.  The patient does not yet meet surgical criteria of >5.5cm. The patient is aware of signs and symptoms of aortic dissection and when to present to the emergency department   -Follow up in one year with CTA of chest and virtual visit for continued surveillance   Pulmonary nodules -On CTA there was a small grouping of sub 4 mm nodules in right upper lobe.  This has not been seen on another CT of chest.  Patient denies cough, recent URI, fever.  Will continue to follow with yearly CT scans.    I discussed limitations of evaluation and management via telephone.  The patient was advised to call back for repeat telephone consultation or to seek an in-person evaluation if questions arise or the patient's clinical condition changes in any significant manner.  I spent in excess of 15 minutes of non-face-to-face time during the conduct of this telephone virtual office consultation, including pre-visit review of the patient's records and direct conversation with the patient.   Level 1  (99441)             5-10 minutes Level 2  (99442)            11-20 minutes Level 3  (99443)            21-30 minutes   Manuelita CHRISTELLA Rough, PA-C 06/12/2024 10:28 AM

## 2024-06-12 NOTE — Patient Instructions (Signed)
 Risk Modification in those with ascending thoracic aortic aneurysm:   Continue control of blood pressure (prefer BP 130/80 or less)   2. Avoid fluoroquinolone antibiotics (I.e Ciprofloxacin, Avelox, Levofloxacin, Ofloxacin)   3.  Use of statin (to decrease cardiovascular risk)   4.  Exercise and activity limitations is individualized, but in general, contact sports are to be avoided and one should avoid heavy lifting (defined as half of ideal body weight) and exercises involving sustained Valsalva maneuver.   5.  Follow-up in one year with CT chest.

## 2024-06-25 ENCOUNTER — Other Ambulatory Visit: Payer: Self-pay | Admitting: *Deleted

## 2024-06-25 ENCOUNTER — Encounter: Payer: Self-pay | Admitting: Oncology

## 2024-06-25 MED ORDER — OXYCODONE HCL 10 MG PO TABS: 10.0000 mg | ORAL_TABLET | ORAL | 0 refills | Status: DC | PRN

## 2024-06-26 ENCOUNTER — Encounter: Payer: Self-pay | Admitting: Oncology

## 2024-06-26 ENCOUNTER — Other Ambulatory Visit: Payer: Self-pay | Admitting: *Deleted

## 2024-06-26 MED ORDER — MORPHINE SULFATE ER 15 MG PO TBCR: 30.0000 mg | EXTENDED_RELEASE_TABLET | Freq: Three times a day (TID) | ORAL | 0 refills | Status: DC

## 2024-06-27 ENCOUNTER — Other Ambulatory Visit: Payer: Self-pay

## 2024-06-27 ENCOUNTER — Other Ambulatory Visit: Payer: Self-pay | Admitting: *Deleted

## 2024-06-27 MED ORDER — MORPHINE SULFATE ER 15 MG PO TBCR
30.0000 mg | EXTENDED_RELEASE_TABLET | Freq: Three times a day (TID) | ORAL | 0 refills | Status: DC
  Filled 2024-06-27: qty 180, 30d supply, fill #0

## 2024-07-13 ENCOUNTER — Encounter: Payer: Self-pay | Admitting: Oncology

## 2024-07-13 NOTE — Telephone Encounter (Signed)
 R/f request

## 2024-07-17 DIAGNOSIS — H353231 Exudative age-related macular degeneration, bilateral, with active choroidal neovascularization: Secondary | ICD-10-CM | POA: Diagnosis not present

## 2024-07-17 DIAGNOSIS — H35033 Hypertensive retinopathy, bilateral: Secondary | ICD-10-CM | POA: Diagnosis not present

## 2024-07-17 DIAGNOSIS — H43812 Vitreous degeneration, left eye: Secondary | ICD-10-CM | POA: Diagnosis not present

## 2024-07-17 DIAGNOSIS — Z961 Presence of intraocular lens: Secondary | ICD-10-CM | POA: Diagnosis not present

## 2024-07-25 ENCOUNTER — Ambulatory Visit: Payer: Self-pay | Admitting: Urology

## 2024-07-30 ENCOUNTER — Other Ambulatory Visit: Payer: Self-pay

## 2024-07-30 ENCOUNTER — Encounter: Payer: Self-pay | Admitting: Oncology

## 2024-07-30 DIAGNOSIS — D485 Neoplasm of uncertain behavior of skin: Secondary | ICD-10-CM | POA: Diagnosis not present

## 2024-07-30 DIAGNOSIS — Z859 Personal history of malignant neoplasm, unspecified: Secondary | ICD-10-CM | POA: Diagnosis not present

## 2024-07-30 DIAGNOSIS — L570 Actinic keratosis: Secondary | ICD-10-CM | POA: Diagnosis not present

## 2024-07-30 DIAGNOSIS — L720 Epidermal cyst: Secondary | ICD-10-CM | POA: Diagnosis not present

## 2024-07-30 DIAGNOSIS — Z872 Personal history of diseases of the skin and subcutaneous tissue: Secondary | ICD-10-CM | POA: Diagnosis not present

## 2024-07-30 DIAGNOSIS — Z85828 Personal history of other malignant neoplasm of skin: Secondary | ICD-10-CM | POA: Diagnosis not present

## 2024-07-30 DIAGNOSIS — C44329 Squamous cell carcinoma of skin of other parts of face: Secondary | ICD-10-CM | POA: Diagnosis not present

## 2024-07-30 DIAGNOSIS — L578 Other skin changes due to chronic exposure to nonionizing radiation: Secondary | ICD-10-CM | POA: Diagnosis not present

## 2024-07-30 MED ORDER — MORPHINE SULFATE ER 15 MG PO TBCR
30.0000 mg | EXTENDED_RELEASE_TABLET | Freq: Three times a day (TID) | ORAL | 0 refills | Status: DC
  Filled 2024-07-30: qty 180, 30d supply, fill #0

## 2024-07-30 MED ORDER — OXYCODONE HCL 10 MG PO TABS: 10.0000 mg | ORAL_TABLET | ORAL | 0 refills | Status: DC | PRN

## 2024-07-30 NOTE — Telephone Encounter (Signed)
 2 different refill requests pended and sent to MD

## 2024-07-30 NOTE — Progress Notes (Signed)
 08/01/2024 11:52 AM   [name] [birth year] [id number]  Referring provider: Glover Lenis, MD [phone] [place],  [place] [address]  Urological history: 1. Elevated PSA  -PSA (2020) 17.8 - On watchful waiting  -he refused further work-up    2. Nephrolithiasis  -Stone analysis was 100% uric acid - Right-sided ureteroscopy in [place] in 2019 that was complicated by proximal ureteral perforation with migrated right ureteral stent - CT on 12/08/2020 visualized obstructing 11 x 15 mm stone at the right ureteropelvic junction with moderate hydronephrosis and mild perinephric edema. - He is s/p ureteroscopic removal of right renal pelvic calculus after failed SWL on 12/16/2020 - has not completed a metabolic work up at this time - PET scan 11/2021 - punctate stone in the lower pole of left kidney  - Pelvic CT (05/2022) - right renal calculi measuring up to 4 mm.  There is a single punctate calculus in the bladder   3. Right ureteral perforation  -occurred with bilateral URS 2019  -imaging x several with no extravasation of contrast or hydronephrosis  4. BPH with LU TS - Tamsulosin  caused hypotension  Chief Complaint  Patient presents with   Elevated PSA   HPI: IKECHUKWU CERNY is a 89 y.o. man who presents today for yearly visit with his wife, [name].   Previous records reviewed.  I PSS 13/2  He reports urinary frequency, urinary intermittency, urinary urgency, a weak urinary stream, having to strain to void, and nocturia x 4.  Patient denies any modifying or aggravating factors.  Patient denies any recent UTI's, gross hematuria, dysuria or suprapubic/flank pain.  Patient denies any fevers, chills, nausea or vomiting.    Serum creatinine (05/2024) 1.33, eGFR 51   PMH: Past Medical History:  Diagnosis Date   Ascending aortic aneurysm    a. 12/2020 Echo: Asc Ao 48mm, Ao root 40mm. b. 03/2022 Asc Ao 4.6 cm (4.5 cm in 2019)   CKD (chronic kidney disease),  stage III (HCC)    Compression fracture of body of thoracic vertebra (HCC)    Diastolic dysfunction    a. 12/2020 Echo: EF 50-55%, no rwma, mild LVH, GrI DD, nl RV fxn, mild AI. Asc Ao 48mm, Ao root 40mm.   Elevated prostate specific antigen (PSA)    has been 7 for a year    GERD (gastroesophageal reflux disease)    History of colon polyps 2008   [facility],    History of kidney stones    Hyperlipidemia    Hypertension    Myeloma (HCC)    PAF (paroxysmal atrial fibrillation) (HCC)    a. 01/2021-->Eliquis  (CHA2DS2VASc = 3-4).   Pain    Prostate hypertrophy    diagnosed at age 69 due to hematospermia    Surgical History: Past Surgical History:  Procedure Laterality Date   CATARACT EXTRACTION W/PHACO Left 01/10/2018   Procedure: CATARACT EXTRACTION PHACO AND INTRAOCULAR LENS PLACEMENT (IOC);  Surgeon: Jaye Fallow, MD;  Location: [facility];  Service: Ophthalmology;  Laterality: Left;  US  00:24.8 AP% 14.9 CDE 3.68 Fluid pack lot # [id number]   CATARACT EXTRACTION W/PHACO Right 01/25/2018   Procedure: CATARACT EXTRACTION PHACO AND INTRAOCULAR LENS PLACEMENT (IOC);  Surgeon: Jaye Fallow, MD;  Location: [facility];  Service: Ophthalmology;  Laterality: Right;  US  00:42 AP% 10.8 CDE 4.59 Fluid pack lot # [id number]   COLON SURGERY     CYSTOSCOPY W/ URETERAL STENT PLACEMENT Right 10/16/2017   Procedure: right  URETERAL STENT PLACEMENT,cystoscopy bilateral  stent removal,rretrograde;  Surgeon: Twylla Glendia BROCKS, MD;  Location: [facility];  Service: Urology;  Laterality: Right;   CYSTOSCOPY/URETEROSCOPY/HOLMIUM LASER/STENT PLACEMENT Right 12/16/2020   Procedure: CYSTOSCOPY/URETEROSCOPY/HOLMIUM LASER/STENT PLACEMENT;  Surgeon: Twylla Glendia BROCKS, MD;  Location: [facility];  Service: Urology;  Laterality: Right;   EXTRACORPOREAL SHOCK WAVE LITHOTRIPSY Right 12/11/2020   Procedure: EXTRACORPOREAL SHOCK WAVE LITHOTRIPSY (ESWL);  Surgeon: Twylla Glendia BROCKS, MD;  Location: [facility];  Service: Urology;   Laterality: Right;   IR BONE MARROW BIOPSY & ASPIRATION  04/26/2024   IR BONE TUMOR(S)RF ABLATION  04/16/2022   IR KYPHO EA ADDL LEVEL THORACIC OR LUMBAR  02/02/2021   IR KYPHO LUMBAR INC FX REDUCE BONE BX UNI/BIL CANNULATION INC/IMAGING  02/02/2021   IR KYPHO THORACIC WITH BONE BIOPSY  04/15/2022   KIDNEY STONE SURGERY     KYPHOPLASTY N/A 03/12/2021   Procedure: CLARESSA ONA, L3;  Surgeon: Kathlynn Sharper, MD;  Location: [facility];  Service: Orthopedics;  Laterality: N/A;   RESECTION SOFT TISSUE TUMOR LEG / ANKLE RADICAL  jan 2009   [facility],  right thigh/knee , nonmalignant   SMALL INTESTINE SURGERY  1946   implaed on picket fence, punctured stomach   TONSILLECTOMY      Home Medications:  Allergies as of 08/01/2024       Reactions   Quinolones    Aortic dilation contraindicates FQ   Amlodipine  Swelling, Other (See Comments)   LE edema   Azithromycin  Nausea And Vomiting   Lipitor [atorvastatin ]    Muscle pain in RT Leg    Lisinopril  Cough   Zetia  [ezetimibe ]    Pain in legs    Hydromorphone  Hcl    Wife states Medication makes extremely sedated and lethargic        Medication List        Accurate as of August 01, 2024 11:52 AM. If you have any questions, ask your nurse or doctor.          Alpha-Lipoic Acid 600 MG Caps Take by mouth.   carvedilol  25 MG tablet Commonly known as: COREG  TAKE 1/2 TAB TWICE A DAY. IF BP IS HIGH, INCREASE TO 1 TAB TWICE A DAY What changed: Another medication with the same name was removed. Continue taking this medication, and follow the directions you see here. Changed by: CLOTILDA CORNWALL   cyanocobalamin  1000 MCG tablet Commonly known as: VITAMIN B12 Take 1,000 mcg by mouth daily.   dexamethasone  4 MG tablet Commonly known as: DECADRON  TAKE 5 TABLETS BY MOUTH ONCE A WEEK. What changed: See the new instructions.   Eliquis  5 MG Tabs tablet Generic drug: apixaban  Take 5 mg by mouth 2 (two) times daily. What changed: Another  medication with the same name was removed. Continue taking this medication, and follow the directions you see here. Changed by: CLOTILDA CORNWALL   gabapentin  300 MG capsule Commonly known as: NEURONTIN  TAKE 2 CAPSULES BY MOUTH 3 TIMES DAILY.   ibuprofen  200 MG tablet Commonly known as: ADVIL  Take 200 mg by mouth 2 (two) times daily as needed.   lenalidomide  15 MG capsule Commonly known as: REVLIMID  Take 1 capsule (15 mg total) by mouth daily. Take for 21 days, then hold for 7 days. Repeat every 28 days. What changed: additional instructions   lidocaine  5 % ointment Commonly known as: XYLOCAINE  Apply topically 3 (three) times daily as needed for mild pain (pain score 1-3) or moderate pain (pain score 4-6).   morphine  15 MG 12 hr tablet Commonly known  as: MS CONTIN  Take 2 tablets (30 mg total) by mouth every 8 (eight) hours.   multivitamin with minerals Tabs tablet Take 1 tablet by mouth daily.   naloxone  4 MG/0.1ML Liqd nasal spray kit Commonly known as: NARCAN  SPRAY 1 SPRAY INTO ONE NOSTRIL AS DIRECTED FOR OPIOID OVERDOSE (TURN PERSON ON SIDE AFTER DOSE. IF NO RESPONSE IN 2-3 MINUTES OR PERSON RESPONDS BUT RELAPSES, REPEAT USING A NEW SPRAY DEVICE AND SPRAY INTO THE OTHER NOSTRIL. CALL 911 AFTER USE.) * EMERGENCY USE ONLY * What changed: additional instructions   omeprazole  20 MG capsule Commonly known as: PRILOSEC TAKE 1 CAPSULE BY MOUTH EVERY DAY   Oxycodone  HCl 10 MG Tabs Take 1 tablet (10 mg total) by mouth every 4 (four) hours as needed (pain).   Oyster Shell Calcium  w/D 500-5 MG-MCG Tabs Take 1 tablet by mouth in the morning and at bedtime.   polyethylene glycol 17 g packet Commonly known as: MIRALAX  / GLYCOLAX  Take 17 g by mouth 2 (two) times daily.   potassium citrate  10 MEQ (1080 MG) SR tablet Commonly known as: UROCIT-K  TAKE 1 TABLET (10 MEQ TOTAL) BY MOUTH 3 (THREE) TIMES DAILY WITH MEALS.   PRESERVISION AREDS PO Take 1 capsule by mouth in the morning and  at bedtime.   senna-docusate 8.6-50 MG tablet Commonly known as: Senokot-S Take 1 tablet by mouth 2 (two) times daily.   vitamin C 1000 MG tablet Take 1,000 mg by mouth in the morning and at bedtime.        Allergies:  Allergies  Allergen Reactions   Quinolones     Aortic dilation contraindicates FQ   Amlodipine  Swelling and Other (See Comments)    LE edema   Azithromycin  Nausea And Vomiting   Lipitor [Atorvastatin ]     Muscle pain in RT Leg    Lisinopril  Cough   Zetia  [Ezetimibe ]     Pain in legs    Hydromorphone  Hcl     Wife states Medication makes extremely sedated and lethargic    Family History: Family History  Problem Relation Age of Onset   Hypertension Father    Hyperlipidemia Father    Cancer Sister        thyroid  - dx in late 20's    Social History:  reports that he quit smoking about 59 years ago. His smoking use included cigarettes. He uses smokeless tobacco. He reports that he does not currently use alcohol after a past usage of about 1.0 standard drink of alcohol per week. He reports that he does not use drugs.  ROS: Pertinent ROS in HPI  Physical Exam: BP 100/66   Pulse (!) 57   Ht 5' 6 (1.676 m)   Wt 175 lb (79.4 kg)   SpO2 93%   BMI 28.25 kg/m   Constitutional:  Well nourished. Alert and oriented, No acute distress. HEENT: [place] AT, moist mucus membranes.  Trachea midline Cardiovascular: No clubbing, cyanosis, or edema. Respiratory: Normal respiratory effort, no increased work of breathing. Neurologic: Grossly intact, no focal deficits, moving all 4 extremities. Psychiatric: Normal mood and affect.  Laboratory Data: See Epic and HPI   I have reviewed the labs.   Pertinent Imaging: N/A  Assessment & Plan:    1. Elevated PSA - We discussed checking a PSA to see where he stands as I explained when prostate cancer metastasizes does tend to go to the pelvis and spine and since he is having issues with back pain it may be worthwhile to  evaluate for this possibility and he is in agreement, I will add a PSA to his blood work from the cancer center in January  2.  Nephrolithiasis - has history of uric acid stones - No flank pain, gross hematuria or passage of fragments over the last year  3. BPH with LU TS - moderate symptoms and he is mostly satisfied - encouraged avoiding bladder irritants, fluid restriction before bedtime and timed voiding's  Return for Follow up pending labs.  These notes generated with voice recognition software. I apologize for typographical errors.  CLOTILDA HELON RIGGERS  [facility] [address]  [address] [place], [place] [address] [phone]

## 2024-08-01 ENCOUNTER — Encounter: Payer: Self-pay | Admitting: Urology

## 2024-08-01 ENCOUNTER — Ambulatory Visit: Admitting: Urology

## 2024-08-01 VITALS — BP 100/66 | HR 57 | Ht 66.0 in | Wt 175.0 lb

## 2024-08-01 DIAGNOSIS — R351 Nocturia: Secondary | ICD-10-CM

## 2024-08-01 DIAGNOSIS — N200 Calculus of kidney: Secondary | ICD-10-CM

## 2024-08-01 DIAGNOSIS — C9000 Multiple myeloma not having achieved remission: Secondary | ICD-10-CM

## 2024-08-01 DIAGNOSIS — N401 Enlarged prostate with lower urinary tract symptoms: Secondary | ICD-10-CM

## 2024-08-01 DIAGNOSIS — R972 Elevated prostate specific antigen [PSA]: Secondary | ICD-10-CM

## 2024-08-10 ENCOUNTER — Other Ambulatory Visit: Payer: Self-pay | Admitting: Oncology

## 2024-08-10 DIAGNOSIS — C9000 Multiple myeloma not having achieved remission: Secondary | ICD-10-CM

## 2024-08-13 ENCOUNTER — Encounter: Payer: Self-pay | Admitting: Oncology

## 2024-08-21 DIAGNOSIS — H353231 Exudative age-related macular degeneration, bilateral, with active choroidal neovascularization: Secondary | ICD-10-CM | POA: Diagnosis not present

## 2024-08-25 ENCOUNTER — Other Ambulatory Visit: Payer: Self-pay | Admitting: Nurse Practitioner

## 2024-08-25 DIAGNOSIS — C9000 Multiple myeloma not having achieved remission: Secondary | ICD-10-CM

## 2024-08-28 ENCOUNTER — Encounter: Payer: Self-pay | Admitting: Oncology

## 2024-08-31 ENCOUNTER — Other Ambulatory Visit: Payer: Self-pay

## 2024-08-31 MED ORDER — OXYCODONE HCL 10 MG PO TABS: 10.0000 mg | ORAL_TABLET | ORAL | 0 refills | Status: DC | PRN

## 2024-08-31 MED ORDER — MORPHINE SULFATE ER 15 MG PO TBCR
30.0000 mg | EXTENDED_RELEASE_TABLET | Freq: Three times a day (TID) | ORAL | 0 refills | Status: DC
  Filled 2024-08-31: qty 180, 30d supply, fill #0

## 2024-09-03 ENCOUNTER — Other Ambulatory Visit (HOSPITAL_COMMUNITY): Payer: Self-pay

## 2024-09-10 ENCOUNTER — Ambulatory Visit: Admitting: Oncology

## 2024-09-10 ENCOUNTER — Inpatient Hospital Stay: Attending: Oncology

## 2024-09-10 DIAGNOSIS — Z79899 Other long term (current) drug therapy: Secondary | ICD-10-CM | POA: Diagnosis not present

## 2024-09-10 DIAGNOSIS — C9001 Multiple myeloma in remission: Secondary | ICD-10-CM | POA: Diagnosis present

## 2024-09-10 DIAGNOSIS — C9000 Multiple myeloma not having achieved remission: Secondary | ICD-10-CM

## 2024-09-10 LAB — CBC WITH DIFFERENTIAL/PLATELET
Abs Immature Granulocytes: 0.01 K/uL (ref 0.00–0.07)
Basophils Absolute: 0 K/uL (ref 0.0–0.1)
Basophils Relative: 0 %
Eosinophils Absolute: 0.1 K/uL (ref 0.0–0.5)
Eosinophils Relative: 2 %
HCT: 40.5 % (ref 39.0–52.0)
Hemoglobin: 13.3 g/dL (ref 13.0–17.0)
Immature Granulocytes: 0 %
Lymphocytes Relative: 27 %
Lymphs Abs: 1 K/uL (ref 0.7–4.0)
MCH: 31.8 pg (ref 26.0–34.0)
MCHC: 32.8 g/dL (ref 30.0–36.0)
MCV: 96.9 fL (ref 80.0–100.0)
Monocytes Absolute: 0.4 K/uL (ref 0.1–1.0)
Monocytes Relative: 12 %
Neutro Abs: 2.1 K/uL (ref 1.7–7.7)
Neutrophils Relative %: 59 %
Platelets: 38 K/uL — ABNORMAL LOW (ref 150–400)
RBC: 4.18 MIL/uL — ABNORMAL LOW (ref 4.22–5.81)
RDW: 14.1 % (ref 11.5–15.5)
WBC: 3.5 K/uL — ABNORMAL LOW (ref 4.0–10.5)
nRBC: 0 % (ref 0.0–0.2)

## 2024-09-10 LAB — CMP (CANCER CENTER ONLY)
ALT: <5 U/L (ref 0–44)
AST: 19 U/L (ref 15–41)
Albumin: 4 g/dL (ref 3.5–5.0)
Alkaline Phosphatase: 131 U/L — ABNORMAL HIGH (ref 38–126)
Anion gap: 10 (ref 5–15)
BUN: 26 mg/dL — ABNORMAL HIGH (ref 8–23)
CO2: 27 mmol/L (ref 22–32)
Calcium: 9.3 mg/dL (ref 8.9–10.3)
Chloride: 102 mmol/L (ref 98–111)
Creatinine: 1.28 mg/dL — ABNORMAL HIGH (ref 0.61–1.24)
GFR, Estimated: 53 mL/min — ABNORMAL LOW
Glucose, Bld: 135 mg/dL — ABNORMAL HIGH (ref 70–99)
Potassium: 4.2 mmol/L (ref 3.5–5.1)
Sodium: 139 mmol/L (ref 135–145)
Total Bilirubin: 0.4 mg/dL (ref 0.0–1.2)
Total Protein: 6.1 g/dL — ABNORMAL LOW (ref 6.5–8.1)

## 2024-09-11 LAB — KAPPA/LAMBDA LIGHT CHAINS
Kappa free light chain: 743.8 mg/L — ABNORMAL HIGH (ref 3.3–19.4)
Kappa, lambda light chain ratio: 82.64 — ABNORMAL HIGH (ref 0.26–1.65)
Lambda free light chains: 9 mg/L (ref 5.7–26.3)

## 2024-09-16 ENCOUNTER — Other Ambulatory Visit: Payer: Self-pay | Admitting: Urology

## 2024-09-17 ENCOUNTER — Encounter: Payer: Self-pay | Admitting: Oncology

## 2024-09-17 ENCOUNTER — Telehealth: Payer: Self-pay

## 2024-09-17 ENCOUNTER — Inpatient Hospital Stay: Attending: Oncology | Admitting: Oncology

## 2024-09-17 VITALS — BP 140/79 | HR 51 | Temp 98.0°F | Resp 18 | Ht 66.0 in | Wt 178.0 lb

## 2024-09-17 DIAGNOSIS — N289 Disorder of kidney and ureter, unspecified: Secondary | ICD-10-CM | POA: Insufficient documentation

## 2024-09-17 DIAGNOSIS — D72819 Decreased white blood cell count, unspecified: Secondary | ICD-10-CM | POA: Insufficient documentation

## 2024-09-17 DIAGNOSIS — C9000 Multiple myeloma not having achieved remission: Secondary | ICD-10-CM | POA: Diagnosis not present

## 2024-09-17 DIAGNOSIS — C9001 Multiple myeloma in remission: Secondary | ICD-10-CM | POA: Insufficient documentation

## 2024-09-17 DIAGNOSIS — M898X9 Other specified disorders of bone, unspecified site: Secondary | ICD-10-CM | POA: Insufficient documentation

## 2024-09-17 DIAGNOSIS — Z79899 Other long term (current) drug therapy: Secondary | ICD-10-CM | POA: Insufficient documentation

## 2024-09-17 DIAGNOSIS — M549 Dorsalgia, unspecified: Secondary | ICD-10-CM | POA: Insufficient documentation

## 2024-09-17 DIAGNOSIS — G8929 Other chronic pain: Secondary | ICD-10-CM | POA: Insufficient documentation

## 2024-09-17 DIAGNOSIS — D696 Thrombocytopenia, unspecified: Secondary | ICD-10-CM | POA: Insufficient documentation

## 2024-09-17 DIAGNOSIS — Z862 Personal history of diseases of the blood and blood-forming organs and certain disorders involving the immune mechanism: Secondary | ICD-10-CM | POA: Insufficient documentation

## 2024-09-17 DIAGNOSIS — F17290 Nicotine dependence, other tobacco product, uncomplicated: Secondary | ICD-10-CM | POA: Insufficient documentation

## 2024-09-17 NOTE — Telephone Encounter (Signed)
 Left VM message on Cancer Center nurse triage line to see if PSA labs can be added on labs for today.

## 2024-09-17 NOTE — Progress Notes (Signed)
 " [facility]  Telephone:([phone] [phone]  ID: Jack [name] OB: April 25, 1933  MR#: [id number]  RDW#:[id number]  Patient Care Team: Glover Lenis, MD as PCP - General (Family Medicine) End, Lonni, MD as PCP - Cardiology (Cardiology) Jana Lavenia SQUIBB, MD (Endocrinology) Jacobo Evalene PARAS, MD as Consulting Physician (Oncology)   CHIEF COMPLAINT: Stage II kappa chain myeloma, in remission.  INTERVAL HISTORY: Patient returns to clinic today for repeat laboratory work and routine 89-month evaluation.  He continues to have chronic back pain that is well-controlled with his current narcotic regimen, but otherwise feels well. He has no neurologic complaints.  He denies any recent fevers or illnesses.  He has a good appetite and denies weight loss.  He denies chest pain, shortness of breath, cough, or hemoptysis.  He denies any nausea, vomiting, constipation, or diarrhea.  He has no urinary complaints.  Offers no further specific complaints today.  REVIEW OF SYSTEMS:   Review of Systems  Constitutional: Negative.  Negative for fever and malaise/fatigue.  Respiratory: Negative.  Negative for cough, hemoptysis and shortness of breath.   Cardiovascular: Negative.  Negative for chest pain and leg swelling.  Gastrointestinal: Negative.  Negative for abdominal pain.  Genitourinary: Negative.  Negative for dysuria and flank pain.  Musculoskeletal:  Positive for back pain. Negative for falls and joint pain.  Skin: Negative.  Negative for rash.  Neurological: Negative.  Negative for dizziness, focal weakness, weakness and headaches.  Psychiatric/Behavioral: Negative.  The patient is not nervous/anxious.     As per HPI. Otherwise, a complete review of systems is negative.  PAST MEDICAL HISTORY: Past Medical History:  Diagnosis Date   Ascending aortic aneurysm    a. 12/2020 Echo: Asc Ao 48mm, Ao root 40mm. b. 03/2022 Asc Ao 4.6 cm (4.5 cm in 2019)   CKD  (chronic kidney disease), stage III (HCC)    Compression fracture of body of thoracic vertebra (HCC)    Diastolic dysfunction    a. 12/2020 Echo: EF 50-55%, no rwma, mild LVH, GrI DD, nl RV fxn, mild AI. Asc Ao 48mm, Ao root 40mm.   Elevated prostate specific antigen (PSA)    has been 7 for a year    GERD (gastroesophageal reflux disease)    History of colon polyps 2008   [facility],    History of kidney stones    Hyperlipidemia    Hypertension    Myeloma (HCC)    PAF (paroxysmal atrial fibrillation) (HCC)    a. 01/2021-->Eliquis  (CHA2DS2VASc = 3-4).   Pain    Prostate hypertrophy    diagnosed at age 89 due to hematospermia    PAST SURGICAL HISTORY: Past Surgical History:  Procedure Laterality Date   CATARACT EXTRACTION W/PHACO Left 01/10/2018   Procedure: CATARACT EXTRACTION PHACO AND INTRAOCULAR LENS PLACEMENT (IOC);  Surgeon: Jaye Fallow, MD;  Location: [facility];  Service: Ophthalmology;  Laterality: Left;  US  00:24.8 AP% 14.9 CDE 3.68 Fluid pack lot # [id number]   CATARACT EXTRACTION W/PHACO Right 01/25/2018   Procedure: CATARACT EXTRACTION PHACO AND INTRAOCULAR LENS PLACEMENT (IOC);  Surgeon: Jaye Fallow, MD;  Location: [facility];  Service: Ophthalmology;  Laterality: Right;  US  00:42 AP% 10.8 CDE 4.59 Fluid pack lot # [id number]   COLON SURGERY     CYSTOSCOPY W/ URETERAL STENT PLACEMENT Right 10/16/2017   Procedure: right  URETERAL STENT PLACEMENT,cystoscopy bilateral stent removal,rretrograde;  Surgeon: Twylla Glendia BROCKS, MD;  Location: [facility];  Service: Urology;  Laterality: Right;   CYSTOSCOPY/URETEROSCOPY/HOLMIUM  LASER/STENT PLACEMENT Right 12/16/2020   Procedure: CYSTOSCOPY/URETEROSCOPY/HOLMIUM LASER/STENT PLACEMENT;  Surgeon: Twylla Glendia BROCKS, MD;  Location: [facility];  Service: Urology;  Laterality: Right;   EXTRACORPOREAL SHOCK WAVE LITHOTRIPSY Right 12/11/2020   Procedure: EXTRACORPOREAL SHOCK WAVE LITHOTRIPSY (ESWL);  Surgeon: Twylla Glendia BROCKS, MD;  Location:  [facility];  Service: Urology;  Laterality: Right;   IR BONE MARROW BIOPSY & ASPIRATION  04/26/2024   IR BONE TUMOR(S)RF ABLATION  04/16/2022   IR KYPHO EA ADDL LEVEL THORACIC OR LUMBAR  02/02/2021   IR KYPHO LUMBAR INC FX REDUCE BONE BX UNI/BIL CANNULATION INC/IMAGING  02/02/2021   IR KYPHO THORACIC WITH BONE BIOPSY  04/15/2022   KIDNEY STONE SURGERY     KYPHOPLASTY N/A 03/12/2021   Procedure: CLARESSA ONA, L3;  Surgeon: Kathlynn Sharper, MD;  Location: [facility];  Service: Orthopedics;  Laterality: N/A;   RESECTION SOFT TISSUE TUMOR LEG / ANKLE RADICAL  jan 2009   [facility],  right thigh/knee , nonmalignant   SMALL INTESTINE SURGERY  1946   implaed on picket fence, punctured stomach   TONSILLECTOMY      FAMILY HISTORY: Family History  Problem Relation Age of Onset   Hypertension Father    Hyperlipidemia Father    Cancer Sister        thyroid  - dx in late 20's    ADVANCED DIRECTIVES (Y/N):  N  HEALTH MAINTENANCE: Social History   Tobacco Use   Smoking status: Former    Current packs/day: 0.00    Types: Cigarettes    Quit date: 07/05/1965    Years since quitting: 59.2   Smokeless tobacco: Current   Tobacco comments:    Occasional cigar  Vaping Use   Vaping status: Never Used  Substance Use Topics   Alcohol use: Not Currently    Alcohol/week: 1.0 standard drink of alcohol    Types: 1 Standard drinks or equivalent per week    Comment: occassionaly   Drug use: No     Colonoscopy:  PAP:  Bone density:  Lipid panel:  Allergies  Allergen Reactions   Quinolones     Aortic dilation contraindicates FQ   Amlodipine  Swelling and Other (See Comments)    LE edema   Azithromycin  Nausea And Vomiting   Lipitor [Atorvastatin ]     Muscle pain in RT Leg    Lisinopril  Cough   Zetia  [Ezetimibe ]     Pain in legs    Hydromorphone  Hcl     Wife states Medication makes extremely sedated and lethargic    Current Outpatient Medications  Medication Sig Dispense Refill   Alpha-Lipoic Acid  600 MG CAPS Take by mouth.     apixaban  (ELIQUIS ) 5 MG TABS tablet Take 5 mg by mouth 2 (two) times daily.     Ascorbic Acid  (VITAMIN C) 1000 MG tablet Take 1,000 mg by mouth in the morning and at bedtime.     Calcium  Carb-Cholecalciferol  (OYSTER SHELL CALCIUM  W/D) 500-5 MG-MCG TABS Take 1 tablet by mouth in the morning and at bedtime.     carvedilol  (COREG ) 25 MG tablet TAKE 1/2 TAB TWICE A DAY. IF BP IS HIGH, INCREASE TO 1 TAB TWICE A DAY     cyanocobalamin  (VITAMIN B12) 1000 MCG tablet Take 1,000 mcg by mouth daily.     dexamethasone  (DECADRON ) 4 MG tablet TAKE 5 TABLETS BY MOUTH ONCE A WEEK. (Patient taking differently: ON HOLD for 3 months) 60 tablet 2   gabapentin  (NEURONTIN ) 300 MG capsule TAKE 2 CAPSULES BY MOUTH  3 TIMES A DAY 180 capsule 2   ibuprofen  (ADVIL ) 200 MG tablet Take 200 mg by mouth 2 (two) times daily as needed.     lenalidomide  (REVLIMID ) 15 MG capsule Take 1 capsule (15 mg total) by mouth daily. Take for 21 days, then hold for 7 days. Repeat every 28 days. (Patient taking differently: Take 15 mg by mouth daily. Take for 21 days, then hold for 7 days. Repeat every 28 days. ON HOLD) 21 capsule 0   lidocaine  (XYLOCAINE ) 5 % ointment APPLY TOPICALLY 3 (THREE) TIMES DAILY AS NEEDED FOR MILD PAIN (PAIN SCORE 1-3) OR MODERATE PAIN (PAIN SCORE 4-6). 50 g 2   morphine  (MS CONTIN ) 15 MG 12 hr tablet Take 2 tablets (30 mg total) by mouth every 8 (eight) hours. 180 tablet 0   Multiple Vitamin (MULTIVITAMIN WITH MINERALS) TABS tablet Take 1 tablet by mouth daily.     Multiple Vitamins-Minerals (PRESERVISION AREDS PO) Take 1 capsule by mouth in the morning and at bedtime.     naloxone  (NARCAN ) nasal spray 4 mg/0.1 mL SPRAY 1 SPRAY INTO ONE NOSTRIL AS DIRECTED FOR OPIOID OVERDOSE (TURN PERSON ON SIDE AFTER DOSE. IF NO RESPONSE IN 2-3 MINUTES OR PERSON RESPONDS BUT RELAPSES, REPEAT USING A NEW SPRAY DEVICE AND SPRAY INTO THE OTHER NOSTRIL. CALL 911 AFTER USE.) * EMERGENCY USE ONLY * (Patient  taking differently: SPRAY 1 SPRAY INTO ONE NOSTRIL AS DIRECTED FOR OPIOID OVERDOSE (TURN PERSON ON SIDE AFTER DOSE. IF NO RESPONSE IN 2-3 MINUTES OR PERSON RESPONDS BUT RELAPSES, REPEAT USING A NEW SPRAY DEVICE AND SPRAY INTO THE OTHER NOSTRIL. CALL 911 AFTER USE.) * EMERGENCY USE ONLY * ON HOLD) 1 each 0   omeprazole  (PRILOSEC) 20 MG capsule TAKE 1 CAPSULE BY MOUTH EVERY DAY 90 capsule 3   Oxycodone  HCl 10 MG TABS Take 1 tablet (10 mg total) by mouth every 4 (four) hours as needed (pain). 90 tablet 0   polyethylene glycol (MIRALAX  / GLYCOLAX ) 17 g packet Take 17 g by mouth 2 (two) times daily.  0   potassium citrate  (UROCIT-K ) 10 MEQ (1080 MG) SR tablet TAKE 1 TABLET (10 MEQ TOTAL) BY MOUTH 3 (THREE) TIMES DAILY WITH MEALS. 300 tablet 2   senna-docusate (SENOKOT-S) 8.6-50 MG tablet Take 1 tablet by mouth 2 (two) times daily. 30 tablet 0   No current facility-administered medications for this visit.    OBJECTIVE: Vitals:   09/17/24 1100  BP: (!) 140/79  Pulse: (!) 51  Resp: 18  Temp: 98 F (36.7 C)  SpO2: 99%       Body mass index is 28.73 kg/m.    ECOG FS:1 - Symptomatic but completely ambulatory  General: Well-developed, well-nourished, no acute distress. Eyes: Pink conjunctiva, anicteric sclera. HEENT: Normocephalic, moist mucous membranes. Lungs: No audible wheezing or coughing. Heart: Regular rate and rhythm. Abdomen: Soft, nontender, no obvious distention. Musculoskeletal: No edema, cyanosis, or clubbing. Neuro: Alert, answering all questions appropriately. Cranial nerves grossly intact. Skin: No rashes or petechiae noted. Psych: Normal affect.  LAB RESULTS:  Lab Results  Component Value Date   NA 139 09/10/2024   K 4.2 09/10/2024   CL 102 09/10/2024   CO2 27 09/10/2024   GLUCOSE 135 (H) 09/10/2024   BUN 26 (H) 09/10/2024   CREATININE 1.28 (H) 09/10/2024   CALCIUM  9.3 09/10/2024   PROT 6.1 (L) 09/10/2024   ALBUMIN 4.0 09/10/2024   AST 19 09/10/2024   ALT <5  09/10/2024   ALKPHOS 131 (H)  09/10/2024   BILITOT 0.4 09/10/2024   GFRNONAA 53 (L) 09/10/2024   GFRAA 29 (L) 10/16/2017    Lab Results  Component Value Date   WBC 3.5 (L) 09/10/2024   NEUTROABS 2.1 09/10/2024   HGB 13.3 09/10/2024   HCT 40.5 09/10/2024   MCV 96.9 09/10/2024   PLT 38 (L) 09/10/2024     STUDIES: No results found.    ASSESSMENT: Stage II Kappa chain myeloma, in remission.  PLAN:    Stage II kappa chain myeloma: (11:14 translocation, high risk) SPEP essentially negative and immunoglobulins are within normal limits.  Patient's kappa light chains initially improved from greater than 5400 down to 39.1.  PET scan results from December 08, 2021 reviewed independently with no obvious evidence of progressive disease.  Nuclear med bone scan results from December 28, 2022 did not reveal any concerning pathology.  Bone marrow biopsy from April 26, 2024 did not reveal any evidence of disease.  Cytogenetics was reported as normal.  His kappa free light chains continue to trend up and are now 743.8.  Patient discontinued Revlimid  in August 2025 and has been instructed to continue to hold treatment.  We discussed possibility of reinitiating Revlimid  at 5 mg daily as well as obtaining a repeat bone marrow biopsy and repeat nuclear med scan prior to initiating treatment.  After lengthy discussion with the patient and his wife, they have elected to continue active surveillance with expressed understanding that he will likely need to reinitiate treatment in the future.  Return to clinic in 3 months with repeat laboratory work and further evaluation.   Anemia: Resolved. Leukopenia: Chronic and unchanged.  Patient's total white blood cell count is 3.5. Thrombocytopenia: Chronic and unchanged.  Patient's platelet count is 38 today.  Bone marrow biopsy results as above.  Continue to hold Revlimid .   Bone lesions: Recent nuclear med bone scan as above with no obvious evidence of progressive disease.   Xgeva  has been discontinued. Pain: Chronic and unchanged.  Continue injections as per pain clinic.  Nuclear medicine bone scan completed on December 28, 2022 did not report any active areas of myeloma.  Patient only takes long-acting morphine  30 mg every 8 hours.  He also takes Tylenol  and ibuprofen  sparingly.  Previously he was instructed that if his platelets fall below 50 this would need to be discontinued.  Radiation oncology has determined no further treatments are needed at this time.   Renal insufficiency: Chronic and unchanged.  Patient's GFR is 53.   Patient expressed understanding and was in agreement with this plan. He also understands that He can call clinic at any time with any questions, concerns, or complaints.     Cancer Staging  Kappa light chain myeloma (HCC) Staging form: Plasma Cell Myeloma and Plasma Cell Disorders, AJCC 8th Edition - Clinical stage from 04/16/2021: Albumin (g/dL): 3.8, ISS: Stage II, High-risk cytogenetics: Absent, LDH: Unknown - Signed by Jacobo Evalene PARAS, MD on 04/16/2021 Albumin range (g/dL): Greater than or equal to 3.5 Cytogenetics: t(11;14) translocation Serum calcium  level: Normal Serum creatinine level: Normal Bone disease on imaging: Present  Evalene PARAS Jacobo, MD   09/17/2024 1:13 PM     "

## 2024-09-17 NOTE — Telephone Encounter (Signed)
 Left VM message on Cancer Center Triage line in regards to adding PSA labs since patient had blood work done today. Left message asking if it came be done.  Andrea Kirks LPN

## 2024-09-17 NOTE — Telephone Encounter (Signed)
 Please advise if pt needs to continue Potassium, requesting refill.

## 2024-09-18 ENCOUNTER — Telehealth: Admitting: Hospice and Palliative Medicine

## 2024-09-18 ENCOUNTER — Telehealth: Payer: Self-pay | Admitting: Oncology

## 2024-09-18 ENCOUNTER — Other Ambulatory Visit: Payer: Self-pay | Admitting: *Deleted

## 2024-09-18 DIAGNOSIS — C9000 Multiple myeloma not having achieved remission: Secondary | ICD-10-CM

## 2024-09-18 NOTE — Telephone Encounter (Signed)
 Pt wife calling and states the pt is scheduled for a dental appt to have teeth removed and a plate put in his mouth on 1/23 and she was told the pt would need to come and get platelets on 1/22.SABRA Please advise on scheduling.

## 2024-09-21 ENCOUNTER — Encounter: Payer: Self-pay | Admitting: Oncology

## 2024-09-21 ENCOUNTER — Telehealth: Admitting: Hospice and Palliative Medicine

## 2024-10-02 ENCOUNTER — Other Ambulatory Visit: Payer: Self-pay | Admitting: *Deleted

## 2024-10-02 DIAGNOSIS — C9000 Multiple myeloma not having achieved remission: Secondary | ICD-10-CM

## 2024-10-03 ENCOUNTER — Other Ambulatory Visit: Payer: Self-pay | Admitting: Oncology

## 2024-10-03 ENCOUNTER — Other Ambulatory Visit: Payer: Self-pay | Admitting: *Deleted

## 2024-10-03 ENCOUNTER — Inpatient Hospital Stay

## 2024-10-03 DIAGNOSIS — C9000 Multiple myeloma not having achieved remission: Secondary | ICD-10-CM

## 2024-10-03 LAB — CBC WITH DIFFERENTIAL/PLATELET
Abs Immature Granulocytes: 0.01 K/uL (ref 0.00–0.07)
Basophils Absolute: 0 K/uL (ref 0.0–0.1)
Basophils Relative: 1 %
Eosinophils Absolute: 0.1 K/uL (ref 0.0–0.5)
Eosinophils Relative: 2 %
HCT: 41.4 % (ref 39.0–52.0)
Hemoglobin: 13.6 g/dL (ref 13.0–17.0)
Immature Granulocytes: 0 %
Lymphocytes Relative: 25 %
Lymphs Abs: 1 K/uL (ref 0.7–4.0)
MCH: 31.8 pg (ref 26.0–34.0)
MCHC: 32.9 g/dL (ref 30.0–36.0)
MCV: 96.7 fL (ref 80.0–100.0)
Monocytes Absolute: 0.3 K/uL (ref 0.1–1.0)
Monocytes Relative: 8 %
Neutro Abs: 2.5 K/uL (ref 1.7–7.7)
Neutrophils Relative %: 64 %
Platelets: 37 K/uL — ABNORMAL LOW (ref 150–400)
RBC: 4.28 MIL/uL (ref 4.22–5.81)
RDW: 14.1 % (ref 11.5–15.5)
WBC: 3.9 K/uL — ABNORMAL LOW (ref 4.0–10.5)
nRBC: 0 % (ref 0.0–0.2)

## 2024-10-03 LAB — CMP (CANCER CENTER ONLY)
ALT: <5 U/L (ref 0–44)
AST: 21 U/L (ref 15–41)
Albumin: 4 g/dL (ref 3.5–5.0)
Alkaline Phosphatase: 127 U/L — ABNORMAL HIGH (ref 38–126)
Anion gap: 9 (ref 5–15)
BUN: 22 mg/dL (ref 8–23)
CO2: 28 mmol/L (ref 22–32)
Calcium: 9.5 mg/dL (ref 8.9–10.3)
Chloride: 102 mmol/L (ref 98–111)
Creatinine: 1.19 mg/dL (ref 0.61–1.24)
GFR, Estimated: 58 mL/min — ABNORMAL LOW
Glucose, Bld: 170 mg/dL — ABNORMAL HIGH (ref 70–99)
Potassium: 4 mmol/L (ref 3.5–5.1)
Sodium: 139 mmol/L (ref 135–145)
Total Bilirubin: 0.5 mg/dL (ref 0.0–1.2)
Total Protein: 6.2 g/dL — ABNORMAL LOW (ref 6.5–8.1)

## 2024-10-03 LAB — ABO/RH
ABO/RH(D): A POS
ABO/RH(D): A POS

## 2024-10-03 LAB — SAMPLE TO BLOOD BANK

## 2024-10-04 ENCOUNTER — Encounter: Payer: Self-pay | Admitting: Oncology

## 2024-10-04 ENCOUNTER — Other Ambulatory Visit: Payer: Self-pay | Admitting: *Deleted

## 2024-10-04 ENCOUNTER — Inpatient Hospital Stay

## 2024-10-04 DIAGNOSIS — C9000 Multiple myeloma not having achieved remission: Secondary | ICD-10-CM

## 2024-10-04 LAB — KAPPA/LAMBDA LIGHT CHAINS
Kappa free light chain: 859.6 mg/L — ABNORMAL HIGH (ref 3.3–19.4)
Kappa, lambda light chain ratio: 97.68 — ABNORMAL HIGH (ref 0.26–1.65)
Lambda free light chains: 8.8 mg/L (ref 5.7–26.3)

## 2024-10-04 MED ORDER — OXYCODONE HCL 10 MG PO TABS: 10.0000 mg | ORAL_TABLET | ORAL | 0 refills | Status: AC | PRN

## 2024-10-04 MED ORDER — ACETAMINOPHEN 325 MG PO TABS
650.0000 mg | ORAL_TABLET | Freq: Once | ORAL | Status: AC
  Administered 2024-10-04: 650 mg via ORAL
  Filled 2024-10-04: qty 2

## 2024-10-04 MED ORDER — DIPHENHYDRAMINE HCL 50 MG/ML IJ SOLN
25.0000 mg | Freq: Once | INTRAMUSCULAR | Status: AC
  Administered 2024-10-04: 25 mg via INTRAVENOUS
  Filled 2024-10-04: qty 1

## 2024-10-04 MED ORDER — MORPHINE SULFATE ER 15 MG PO TBCR: 30.0000 mg | EXTENDED_RELEASE_TABLET | Freq: Three times a day (TID) | ORAL | 0 refills | Status: AC

## 2024-10-04 NOTE — Patient Instructions (Signed)
 Getting Platelets Through an IV (Platelet Transfusion) in Adults: What to Expect A platelet transfusion is when a person gets platelets from another person (donor) through an IV. Platelets are parts of blood that stick together and form a clot to help stop bleeding after an injury. If you don't have enough platelets, you might bleed or bruise easily. You may need a platelet transfusion if you have a health problem that causes a low number of platelets, such as thrombocytopenia. A platelet transfusion may be used to stop or prevent too much bleeding. Tell a health care provider about: Any reactions you've had during past transfusions. Any allergies you have. All medicines you take. These include vitamins, herbs, eye drops, and creams. Any bleeding problems you have. Any surgeries you've had. Any health problems you have. Whether you're pregnant or may be pregnant. What are the risks? Your health care provider will talk with you about risks. These may include: Fever or chills. This might happen if your body reacts to the new cells. It can happen during or up to 4 hours after the transfusion. A mild allergy. You might get red, swollen areas on your skin (hives). You might feel itchy. A bad allergy. You might have trouble breathing or swelling around your face and lips. Very bad reactions are rare but can happen. These may be: Infection. This is rare because donated blood is carefully tested. Hemolytic reaction. This is when the defense system (immune system) of your body attacks the new cells. Symptoms include fever, chills, and a feeling that you may throw up. You may also have low blood pressure and pain in your chest or lower back. Transfusion-associated circulatory overload (TACO). This happens if fluids move to your lungs. This may cause breathing problems. Transfusion-related acute lung injury (TRALI). TRALI can cause breathing problems and low oxygen in your blood. This can happen within  hours or days after the transfusion. Transfusion-associated graft-versus-host disease (TAGVHD). This happens when donated cells attack the body's healthy tissues. What happens before? Take your medicines only as told. You will have a blood test to find out your blood type. This helps match the donor blood with your blood. If you've had an allergy to a transfusion in the past, you may be given medicine to help prevent another allergy. Your temperature, blood pressure, pulse, and breathing will be checked. What happens during platelet transfusion?  An IV will be put into a vein in your hand or arm. For your safety, two health care team members will check your identity and the donor platelets. The bag of donor platelets will be connected to your IV. The platelets will flow into your blood. This usually takes 30-60 minutes. Your temperature, blood pressure, pulse, and breathing will be checked. This helps catch signs of a reaction early. You will be watched for symptoms of all types of reactions, like chills, hives, or itching. If you show signs of a reaction, the transfusion will be stopped. You may be given medicine to help treat the reaction. When your transfusion is done, your IV will be removed. Pressure may be put on the IV site for a few minutes to stop any bleeding. The IV site will be covered with a bandage. These steps may vary. Ask what you can expect. What happens after? You will be watched closely until you leave. This includes checking your blood pressure, temperature, pulse rate, and breathing rate again. This information is not intended to replace advice given to you by your health care  provider. Make sure you discuss any questions you have with your health care provider. Document Revised: 12/16/2023 Document Reviewed: 12/16/2023 Elsevier Patient Education  2025 [employer].

## 2024-10-05 ENCOUNTER — Telehealth: Payer: Self-pay | Admitting: *Deleted

## 2024-10-05 LAB — PREPARE PLATELET PHERESIS: Unit division: 0

## 2024-10-05 LAB — BPAM PLATELET PHERESIS
Blood Product Expiration Date: 202601232359
ISSUE DATE / TIME: 202601221004
Unit Type and Rh: 202601232359
Unit Type and Rh: 7300

## 2024-10-05 NOTE — Telephone Encounter (Signed)
 Caller verified using pt's full name and dob prior to discussing PHI  Jack [name]. [phone] called to confirm that patient did have his plt transfusion yesterday. Pt currently in office to remove 3 teeth. Discussed Dr. Jacobo gave pt 1 unit of plts yesterday. Plt count was 34 on 10/03/24. I confirmed that patient held his Eliquis  x 48-72 hrs prior to procedure.

## 2024-10-14 ENCOUNTER — Encounter: Payer: Self-pay | Admitting: Oncology

## 2024-10-16 ENCOUNTER — Telehealth: Payer: Self-pay

## 2024-10-16 NOTE — Telephone Encounter (Signed)
 Left message with surgical department at [facility] for clarification on what is need for patient to have dental procedure.

## 2024-10-16 NOTE — Telephone Encounter (Signed)
 pt wife called back and states the dentist can do the pt oral surgery on 2/12 at 11 am.  the pt wife states the oral doctor wants the blood before and after to check the plasma levels? I told her that Dr Georgina wanted labs on day 1 and possible blood platelets on day 2 and she said no to check with Georgina about scheduling blood before and after his oral surgery to check his plasma levels.

## 2024-10-17 NOTE — Telephone Encounter (Signed)
 Another message left requesting a call back to discuss oncology care needed for dental procedure.

## 2024-10-17 NOTE — Telephone Encounter (Signed)
 Spoke with Shanda, Dr. Dannial is wanting Mr. Lindamood platelet count at 100 prior to dental procedure.  Advised that platelet count has not been to 100 since 2023.  Dr. Dannial then request patient get 3 Units of platelets day before procedure, informed that isn't an option per oncology/outpatient protocol.  Dr. Dannial then requested discuss with Dr. Jacobo, cell number provided.

## 2024-10-19 ENCOUNTER — Telehealth: Payer: Self-pay | Admitting: *Deleted

## 2024-10-19 NOTE — Telephone Encounter (Incomplete)
 Patient is scheduled for the oral surgery and extractions on 1/12. Wife is requesting pt to be schedule for unit of plts and labs. She spoke with Dr. Jaqueline office yesterday and the oral surgery team stated Dr. Jacobo has not spoken directly with Dr. Dannial. Wife was very adamant that the plt infusion be added for next week prior to the dental procedures. She stated that they have already paid for the dental plate and this dental procedure has already been held the other week because the plt count was not high enough for Dr. Dannial to perform the procedure. I explained to her that we need to collaborate the care to ensure safety prior to any dental procedures. I will send the message to the team. Per 2/4 notes, Dr. Dannial was recommending plt count to be 100 before the dental procedure. I explained to wife that it has been since 2023 since ptl count was at this range. There was a note that Dr. Dannial was recommending 3 untis of plts the day before the procedure and this was not an option per outpatient protocol. Wife gave verbal understanding and requested a follow-up call as soon as possible with the plan.

## 2024-12-31 ENCOUNTER — Inpatient Hospital Stay

## 2025-01-07 ENCOUNTER — Inpatient Hospital Stay: Admitting: Oncology
